# Patient Record
Sex: Male | Born: 1950 | Race: White | Hispanic: No | Marital: Married | Smoking: Never smoker
Health system: Southern US, Community
[De-identification: ages and names within clinical notes are randomized; demographics above are authoritative.]

## PROBLEM LIST (undated history)

## (undated) DIAGNOSIS — T4145XA Adverse effect of unspecified anesthetic, initial encounter: Secondary | ICD-10-CM

## (undated) DIAGNOSIS — G4733 Obstructive sleep apnea (adult) (pediatric): Secondary | ICD-10-CM

## (undated) DIAGNOSIS — Z8719 Personal history of other diseases of the digestive system: Secondary | ICD-10-CM

## (undated) DIAGNOSIS — M1990 Unspecified osteoarthritis, unspecified site: Secondary | ICD-10-CM

## (undated) DIAGNOSIS — C9000 Multiple myeloma not having achieved remission: Secondary | ICD-10-CM

## (undated) DIAGNOSIS — I499 Cardiac arrhythmia, unspecified: Secondary | ICD-10-CM

## (undated) DIAGNOSIS — I809 Phlebitis and thrombophlebitis of unspecified site: Secondary | ICD-10-CM

## (undated) DIAGNOSIS — I4891 Unspecified atrial fibrillation: Secondary | ICD-10-CM

## (undated) DIAGNOSIS — I82409 Acute embolism and thrombosis of unspecified deep veins of unspecified lower extremity: Secondary | ICD-10-CM

## (undated) DIAGNOSIS — I480 Paroxysmal atrial fibrillation: Secondary | ICD-10-CM

## (undated) DIAGNOSIS — R7302 Impaired glucose tolerance (oral): Secondary | ICD-10-CM

## (undated) DIAGNOSIS — J189 Pneumonia, unspecified organism: Secondary | ICD-10-CM

## (undated) DIAGNOSIS — Z9989 Dependence on other enabling machines and devices: Secondary | ICD-10-CM

## (undated) DIAGNOSIS — R7303 Prediabetes: Secondary | ICD-10-CM

## (undated) DIAGNOSIS — Z923 Personal history of irradiation: Secondary | ICD-10-CM

## (undated) DIAGNOSIS — K219 Gastro-esophageal reflux disease without esophagitis: Secondary | ICD-10-CM

## (undated) DIAGNOSIS — I10 Essential (primary) hypertension: Secondary | ICD-10-CM

## (undated) DIAGNOSIS — T8859XA Other complications of anesthesia, initial encounter: Secondary | ICD-10-CM

## (undated) DIAGNOSIS — E669 Obesity, unspecified: Secondary | ICD-10-CM

## (undated) HISTORY — PX: KNEE ARTHROSCOPY: SHX127

## (undated) HISTORY — DX: Obesity, unspecified: E66.9

## (undated) HISTORY — DX: Paroxysmal atrial fibrillation: I48.0

## (undated) HISTORY — PX: VARICOSE VEIN SURGERY: SHX832

## (undated) HISTORY — PX: TONSILLECTOMY: SUR1361

---

## 1986-05-03 HISTORY — PX: TESTICLE SURGERY: SHX794

## 1997-11-26 ENCOUNTER — Ambulatory Visit (HOSPITAL_COMMUNITY): Admission: RE | Admit: 1997-11-26 | Discharge: 1997-11-26 | Payer: Self-pay | Admitting: Neurology

## 1997-12-03 ENCOUNTER — Ambulatory Visit (HOSPITAL_COMMUNITY): Admission: RE | Admit: 1997-12-03 | Discharge: 1997-12-03 | Payer: Self-pay | Admitting: Neurology

## 2004-04-24 ENCOUNTER — Encounter: Admission: RE | Admit: 2004-04-24 | Discharge: 2004-04-24 | Payer: Self-pay | Admitting: Geriatric Medicine

## 2005-02-01 ENCOUNTER — Ambulatory Visit: Admission: RE | Admit: 2005-02-01 | Discharge: 2005-02-01 | Payer: Self-pay | Admitting: Family Medicine

## 2012-05-24 ENCOUNTER — Observation Stay (HOSPITAL_COMMUNITY)
Admission: EM | Admit: 2012-05-24 | Discharge: 2012-05-24 | Disposition: A | Discharge status: Home / Self Care | Payer: PRIVATE HEALTH INSURANCE | Attending: Interventional Cardiology | Admitting: Interventional Cardiology

## 2012-05-24 ENCOUNTER — Emergency Department (HOSPITAL_COMMUNITY): Payer: PRIVATE HEALTH INSURANCE

## 2012-05-24 ENCOUNTER — Encounter (HOSPITAL_COMMUNITY): Payer: Self-pay | Admitting: *Deleted

## 2012-05-24 DIAGNOSIS — R079 Chest pain, unspecified: Principal | ICD-10-CM | POA: Insufficient documentation

## 2012-05-24 DIAGNOSIS — I10 Essential (primary) hypertension: Secondary | ICD-10-CM | POA: Insufficient documentation

## 2012-05-24 HISTORY — DX: Essential (primary) hypertension: I10

## 2012-05-24 LAB — COMPREHENSIVE METABOLIC PANEL
ALT: 40 U/L (ref 0–53)
Albumin: 3.7 g/dL (ref 3.5–5.2)
Alkaline Phosphatase: 78 U/L (ref 39–117)
BUN: 11 mg/dL (ref 6–23)
CO2: 24 mEq/L (ref 19–32)
Calcium: 9.5 mg/dL (ref 8.4–10.5)
Chloride: 101 mEq/L (ref 96–112)
Creatinine, Ser: 0.82 mg/dL (ref 0.50–1.35)
GFR calc Af Amer: >90 mL/min (ref >90)
GFR calc non Af Amer: >90 mL/min (ref >90)
Glucose, Bld: 106 mg/dL — ABNORMAL HIGH (ref 70–99)
Potassium: 3.6 mEq/L (ref 3.5–5.1)
Total Bilirubin: 0.4 mg/dL (ref 0.3–1.2)
Total Protein: 7.2 g/dL (ref 6.0–8.3)

## 2012-05-24 LAB — CBC WITH DIFFERENTIAL/PLATELET
Basophils Relative: 0 % (ref 0–1)
Eosinophils Absolute: 0.1 10*3/uL (ref 0.0–0.7)
HCT: 44.6 % (ref 39.0–52.0)
Hemoglobin: 16.5 g/dL (ref 13.0–17.0)
Lymphs Abs: 1.4 10*3/uL (ref 0.7–4.0)
MCH: 33.5 pg (ref 26.0–34.0)
MCHC: 37 g/dL — ABNORMAL HIGH (ref 30.0–36.0)
MCV: 90.5 fL (ref 78.0–100.0)
Monocytes Absolute: 1.2 10*3/uL — ABNORMAL HIGH (ref 0.1–1.0)
Monocytes Relative: 13 % — ABNORMAL HIGH (ref 3–12)
Platelets: 228 10*3/uL (ref 150–400)
RBC: 4.93 MIL/uL (ref 4.22–5.81)
RDW: 12.7 % (ref 11.5–15.5)

## 2012-05-24 LAB — CBC
Platelets: 244 10*3/uL (ref 150–400)
RBC: 5.12 MIL/uL (ref 4.22–5.81)
RDW: 12.7 % (ref 11.5–15.5)
WBC: 8.2 10*3/uL (ref 4.0–10.5)

## 2012-05-24 LAB — LIPASE, BLOOD: Lipase: 25 U/L (ref 11–59)

## 2012-05-24 LAB — APTT: aPTT: 33 seconds (ref 24–37)

## 2012-05-24 LAB — CREATININE, SERUM
Creatinine, Ser: 0.91 mg/dL (ref 0.50–1.35)
GFR calc Af Amer: >90 mL/min (ref >90)
GFR calc non Af Amer: 90 mL/min — ABNORMAL LOW (ref >90)

## 2012-05-24 LAB — CK TOTAL AND CKMB (NOT AT ARMC)
CK, MB: 4.7 ng/mL — ABNORMAL HIGH (ref 0.3–4.0)
CK, MB: 4.6 ng/mL — ABNORMAL HIGH (ref 0.3–4.0)
Relative Index: 2.6 — ABNORMAL HIGH (ref 0.0–2.5)

## 2012-05-24 LAB — TROPONIN I
Troponin I: <0.3 ng/mL (ref <0.30)
Troponin I: <0.3 ng/mL (ref <0.30)

## 2012-05-24 LAB — PROTIME-INR: INR: 0.98 (ref 0.00–1.49)

## 2012-05-24 MED ORDER — ASPIRIN 81 MG PO CHEW
324.0000 mg | CHEWABLE_TABLET | Freq: Once | ORAL | Status: AC
  Administered 2012-05-24: 324 mg via ORAL
  Filled 2012-05-24: qty 4

## 2012-05-24 MED ORDER — ASPIRIN 81 MG PO CHEW: 324.0000 mg | CHEWABLE_TABLET | ORAL | Status: DC

## 2012-05-24 MED ORDER — HYDROCHLOROTHIAZIDE 12.5 MG PO CAPS: 12.5000 mg | ORAL_CAPSULE | Freq: Every day | ORAL | Status: DC

## 2012-05-24 MED ORDER — ALPRAZOLAM 0.25 MG PO TABS: 0.2500 mg | ORAL_TABLET | Freq: Two times a day (BID) | ORAL | Status: DC | PRN

## 2012-05-24 MED ORDER — MORPHINE SULFATE 4 MG/ML IJ SOLN: 4.0000 mg | INTRAMUSCULAR | Status: DC | PRN

## 2012-05-24 MED ORDER — ONDANSETRON HCL 4 MG/2ML IJ SOLN: 4.0000 mg | Freq: Four times a day (QID) | INTRAMUSCULAR | Status: DC | PRN

## 2012-05-24 MED ORDER — ASPIRIN EC 81 MG PO TBEC: 81.0000 mg | DELAYED_RELEASE_TABLET | Freq: Every day | ORAL | Status: DC

## 2012-05-24 MED ORDER — ACETAMINOPHEN 325 MG PO TABS: 650.0000 mg | ORAL_TABLET | ORAL | Status: DC | PRN

## 2012-05-24 MED ORDER — ASPIRIN 300 MG RE SUPP: 300.0000 mg | RECTAL | Status: DC

## 2012-05-24 MED ORDER — NITROGLYCERIN 0.4 MG SL SUBL: 0.4000 mg | SUBLINGUAL_TABLET | SUBLINGUAL | Status: DC | PRN

## 2012-05-24 MED ORDER — LISINOPRIL 20 MG PO TABS: 30.0000 mg | ORAL_TABLET | Freq: Every day | ORAL | Status: DC

## 2012-05-24 MED ORDER — ASPIRIN 81 MG PO TBEC: 81.0000 mg | DELAYED_RELEASE_TABLET | Freq: Every day | ORAL | Status: DC

## 2012-05-24 MED ORDER — HEPARIN SODIUM (PORCINE) 5000 UNIT/ML IJ SOLN
5000.0000 [IU] | Freq: Three times a day (TID) | INTRAMUSCULAR | Status: DC
  Filled 2012-05-24 (×3): qty 1

## 2012-05-24 MED ORDER — METOPROLOL SUCCINATE ER 100 MG PO TB24: 100.0000 mg | ORAL_TABLET | Freq: Every day | ORAL | Status: DC

## 2012-05-24 NOTE — Discharge Summary (Signed)
Patient ID: Travis [name] MRN: [id number] DOB/AGE: Mar 08, 1951 62 y.o.  Admit date: 05/24/2012 Discharge date: 05/24/2012  Primary Discharge Diagnosis chest pain Secondary Discharge Diagnosis hypertension  Significant Diagnostic Studies: None  Consults: None  Hospital Course: 62 year old man who is seen in the emergency room do to chest discomfort that woke him from sleep and resolved spontaneously after a few hours.  Given that there were some features concerning for unstable angina, he was observed in the hospital.  He ruled out for MI with multiple sets of enzymes.  His pain was significantly improved throughout his hospital stay.  He was walking in the halls without any difficulty.   He wanted to go home even know it was late at night since his enzymes were negative.  We decided we will followup with an outpatient stress test.     Discharge Exam: Blood pressure 160/82, pulse 63, temperature 98.2 F (36.8 C), temperature source Oral, resp. rate 16, height 5\' 10"  (1.778 m), weight 106.595 kg (235 lb), SpO2 97.00%.   [place]/AT RRR, S1-S2 Clear to auscultation bilaterally Obese No edema  Labs:   Lab Results  Component Value Date   WBC 8.2 05/24/2012   HGB 16.9 05/24/2012   HCT 46.7 05/24/2012   MCV 91.2 05/24/2012   PLT 244 05/24/2012    Lab 05/24/12 1409 05/24/12 0825  NA -- 138  K -- 3.6  CL -- 101  CO2 -- 24  BUN -- 11  CREATININE 0.91 --  CALCIUM -- 9.5  PROT -- 7.2  BILITOT -- 0.4  ALKPHOS -- 78  ALT -- 40  AST -- 32  GLUCOSE -- 106*   Lab Results  Component Value Date   CKTOTAL 177 05/24/2012   CKMB 4.7* 05/24/2012   TROPONINI <0.30 05/24/2012    No results found for this basename: CHOL   No results found for this basename: HDL   No results found for this basename: LDLCALC   No results found for this basename: TRIG   No results found for this basename: CHOLHDL   No results found for this basename: LDLDIRECT      Radiology: No cardiopulmonary  findings EKG: Normal   FOLLOW UP PLANS AND APPOINTMENTS    Medication List     As of 05/24/2012  6:09 PM    TAKE these medications         aspirin 81 MG EC tablet   Take 1 tablet (81 mg total) by mouth daily.      FOLCAPS PO   Take 1 tablet by mouth daily.      hydrochlorothiazide 12.5 MG capsule   Commonly known as: MICROZIDE   Take 12.5 mg by mouth daily.      lisinopril 30 MG tablet   Commonly known as: PRINIVIL,ZESTRIL   Take 30 mg by mouth daily.      metoprolol succinate 100 MG 24 hr tablet   Commonly known as: TOPROL-XL   Take 100 mg by mouth daily. Take with or immediately following a meal.           Follow-up Information    Follow up with Corky Crafts., MD. In 2 days. (stress test)    Contact information:   [address] [place] [address] [phone]          BRING ALL MEDICATIONS WITH YOU TO FOLLOW UP APPOINTMENTS  Time spent with patient to include physician time: 20 minutes  Signed: Alanie Syler S. 05/24/2012, 6:09 PM

## 2012-05-24 NOTE — ED Provider Notes (Signed)
History     CSN: [id number]  Arrival date & time 05/24/12  0865   First MD Initiated Contact with Patient 05/24/12 [phone]      Chief Complaint  Patient presents with  . Chest Pain    (Consider location/radiation/quality/duration/timing/severity/associated sxs/prior treatment) HPI The patient presents with chest pain.  He was awakened from sleep approximately 3 hours prior to arrival with sternal/left sided chest pain.  The pain is dull, pressure-like.  He was initially radiation to the left upper extremity, but this has improved.  He has developed mild lightheadedness since onset of symptoms.  No clear alleviating or exacerbating factors, including no exertional or pleuritic aspect. The patient states that he has a history of hypertension, and reflux.  He states that this is not similar to prior episodes of reflux. No recent cardiac evaluation. He does not smoke.  He drinks one or 2 glasses of wine daily.  Past Medical History  Diagnosis Date  . Hypertension     Past Surgical History  Procedure Date  . Tonsillectomy     No family history on file.  History  Substance Use Topics  . Smoking status: Not on file  . Smokeless tobacco: Not on file  . Alcohol Use: Not on file      Review of Systems  Constitutional:       Per HPI, otherwise negative  HENT:       Per HPI, otherwise negative  Eyes: Negative.   Respiratory:       Per HPI, otherwise negative  Cardiovascular:       Per HPI, otherwise negative  Gastrointestinal: Negative for vomiting.  Genitourinary: Negative.   Musculoskeletal:       Per HPI, otherwise negative  Skin: Negative.   Neurological: Negative for syncope.    Allergies  Review of patient's allergies indicates no known allergies.  Home Medications   Current Outpatient Rx  Name  Route  Sig  Dispense  Refill  . FOLCAPS PO   Oral   Take 1 tablet by mouth daily.         [name] HYDROCHLOROTHIAZIDE 12.5 MG PO CAPS   Oral   Take 12.5 mg by mouth  daily.         [name] LISINOPRIL 30 MG PO TABS   Oral   Take 30 mg by mouth daily.         [name] METOPROLOL SUCCINATE ER 100 MG PO TB24   Oral   Take 100 mg by mouth daily. Take with or immediately following a meal.           BP 155/91  Temp 98.6 F (37 C) (Oral)  Resp 17  Ht 5\' 10"  (1.778 m)  Wt 235 lb (106.595 kg)  BMI 33.72 kg/m2  SpO2 98%  Physical Exam  Nursing note and vitals reviewed. Constitutional: He is oriented to person, place, and time. He appears well-developed. No distress.  HENT:  Head: Normocephalic and atraumatic.  Eyes: Conjunctivae normal and EOM are normal.  Cardiovascular: Normal rate and regular rhythm.   Pulmonary/Chest: Effort normal. No stridor. No respiratory distress.  Abdominal: He exhibits no distension.  Musculoskeletal: He exhibits no edema.  Neurological: He is alert and oriented to person, place, and time.  Skin: Skin is warm and dry.  Psychiatric: He has a normal mood and affect.    ED Course  Procedures (including critical care time)  Labs Reviewed  CBC WITH DIFFERENTIAL - Abnormal; Notable for the following:  MCHC 37.0 (*)     Monocytes Relative 13 (*)     Monocytes Absolute 1.2 (*)     All other components within normal limits  COMPREHENSIVE METABOLIC PANEL  LIPASE, BLOOD  TROPONIN I   Dg Chest 2 View  05/24/2012  *RADIOLOGY REPORT*  Clinical Data: Chest pain.  CHEST - 2 VIEW  Comparison: None  Findings: The cardiac silhouette, mediastinal and hilar contours are within normal limits.  The lungs are clear.  No pleural effusion.  The bony thorax is intact.  IMPRESSION: No acute cardiopulmonary findings.   Original Report Authenticated By: Rudie Meyer, M.D.      No diagnosis found.  Cardiac 70 sinus rhythm normal pulse ox 99% room air normal      Date: 05/24/2012  Rate: 68  Rhythm: normal sinus rhythm  QRS Axis: normal  Intervals: normal  ST/T Wave abnormalities: normal  Conduction Disutrbances:none  Narrative  Interpretation:   Old EKG Reviewed: none available Unremarkable ecg   Update: Patient remains in no distress  MDM  This patient with a history of hypertension now presents after an episode of chest pain.  Given the description of pain: Pressure like, sternal, radiating to left arm, as well as the absence of prior evaluation, and the patient's risk factors, he'll be admitted for further evaluation and management, however, the initial troponin and ECG are largely reassuring.  Gerhard Munch, MD 05/24/12 1045

## 2012-05-24 NOTE — Progress Notes (Signed)
Pt given discharge instructions with understanding. IV d/c and tele d/c. Pt has no questions at this time. Prescription given to pt.

## 2012-05-24 NOTE — Progress Notes (Signed)
Utilization review completed.  

## 2012-05-24 NOTE — ED Notes (Signed)
Admitting MD at bedside.

## 2012-05-24 NOTE — ED Notes (Signed)
Patient transported to X-ray 

## 2012-05-24 NOTE — H&P (Signed)
Admit date: 05/24/2012 Referring Physician:Dr. Jeraldine Loots Primary Cardiologist Eldridge Dace Chief complaint/reason for admission:Chest pain  HPI: 62 y/o who woke up with chest pain this morning.  It was in the center of his chest and it was different than the usual GERD sx that he has.  He felt some soreness going to the left shoulder.  The sx have improved.  He now has some mild soreness in his chest.  There were no inciting or alleviating factors.  Pain just improved on its own.    He is physically active when the weather cooperates.  He plays lacrosse sometimes as well.  No exertional chest pain prior to this.    PMH:    Past Medical History  Diagnosis Date  . Hypertension     PSH:    Past Surgical History  Procedure Date  . Tonsillectomy     ALLERGIES:   Review of patient's allergies indicates no known allergies.  Prior to Admit Meds:   (Not in a hospital admission) Family HX:   No family history on file. Social HX:    History   Social History  . Marital Status: Married    Spouse Name: N/A    Number of Children: N/A  . Years of Education: N/A   Occupational History  . Not on file.   Social History Main Topics  . Smoking status: Not on file  . Smokeless tobacco: Not on file  . Alcohol Use: Not on file  . Drug Use: Not on file  . Sexually Active: Not on file   Other Topics Concern  . Not on file   Social History Narrative  . No narrative on file     ROS:  All 11 ROS were addressed and are negative except what is stated in the HPI  PHYSICAL EXAM Filed Vitals:   05/24/12 1030  BP: 138/83  Pulse: 61  Temp:   Resp: 16   General: Well developed, well nourished, in no acute distress Head: Eyes PERRLA, No xanthomas.   Normal cephalic and atramatic  Lungs: Clear bilaterally to auscultation and percussion. Heart:   HRRR S1 S2  Abdomen: Bowel sounds are positive, abdomen soft and non-tender without masses or                  Hernia's noted. Msk:  Back normal,  normal gait. Normal strength and tone for age. Extremities:   No  edema.  DP +1 Neuro: Alert and oriented X 3. Psych: Normal  affect, responds appropriately   Labs:   Lab Results  Component Value Date   WBC 9.2 05/24/2012   HGB 16.5 05/24/2012   HCT 44.6 05/24/2012   MCV 90.5 05/24/2012   PLT 228 05/24/2012    Lab 05/24/12 0825  NA 138  K 3.6  CL 101  CO2 24  BUN 11  CREATININE 0.82  CALCIUM 9.5  PROT 7.2  BILITOT 0.4  ALKPHOS 78  ALT 40  AST 32  GLUCOSE 106*   Lab Results  Component Value Date   TROPONINI <0.30 05/24/2012   No results found for this basename: PTT   No results found for this basename: INR, PROTIME     No results found for this basename: CHOL   No results found for this basename: HDL   No results found for this basename: LDLCALC   No results found for this basename: TRIG   No results found for this basename: CHOLHDL   No results found for this basename: LDLDIRECT  Radiology:  @RISRSLT24 @  EKG:  Normal sinus rhythm, no ST segment changes  ASSESSMENT: Chest pain with some features concerning for unstable angina  PLAN:  Watch on telemetry.  Rule out for MI with enzymes.  No heparin at this time as his enzymes are negative.  If he rules out and stays symptom free, we'll likely plan for outpatient stress test.  If his enzymes become positive, he would need an angiogram.  Hypertension: Continue ACE inhibitor and diuretic at this time.  His pain has resolved so I'll not start IV nitroglycerin at this time.  Corky Crafts., MD  05/24/2012  10:55 AM

## 2012-05-24 NOTE — ED Notes (Signed)
Awoken from sleep at 0400 with SSCP radiating into LUE, also c/o some lightheadedness past 45 min

## 2012-05-25 LAB — HEMOGLOBIN A1C
Hgb A1c MFr Bld: 5.4 % (ref <5.7)
Mean Plasma Glucose: 108 mg/dL (ref <117)

## 2013-06-20 ENCOUNTER — Other Ambulatory Visit: Payer: Self-pay | Admitting: Geriatric Medicine

## 2013-06-20 DIAGNOSIS — R109 Unspecified abdominal pain: Secondary | ICD-10-CM

## 2013-06-21 ENCOUNTER — Ambulatory Visit
Admission: RE | Admit: 2013-06-21 | Discharge: 2013-06-21 | Disposition: A | Discharge status: Home / Self Care | Payer: PRIVATE HEALTH INSURANCE | Source: Ambulatory Visit | Attending: Geriatric Medicine | Admitting: Geriatric Medicine

## 2013-06-21 DIAGNOSIS — R109 Unspecified abdominal pain: Secondary | ICD-10-CM

## 2014-01-04 ENCOUNTER — Encounter: Payer: Self-pay | Admitting: *Deleted

## 2014-03-14 ENCOUNTER — Emergency Department (HOSPITAL_COMMUNITY): Payer: PRIVATE HEALTH INSURANCE

## 2014-03-14 ENCOUNTER — Encounter (HOSPITAL_COMMUNITY): Payer: Self-pay

## 2014-03-14 ENCOUNTER — Emergency Department (HOSPITAL_COMMUNITY)
Admission: EM | Admit: 2014-03-14 | Discharge: 2014-03-14 | Disposition: A | Discharge status: Home / Self Care | Payer: PRIVATE HEALTH INSURANCE | Attending: Emergency Medicine | Admitting: Emergency Medicine

## 2014-03-14 DIAGNOSIS — K625 Hemorrhage of anus and rectum: Secondary | ICD-10-CM | POA: Diagnosis not present

## 2014-03-14 DIAGNOSIS — K644 Residual hemorrhoidal skin tags: Secondary | ICD-10-CM | POA: Diagnosis not present

## 2014-03-14 DIAGNOSIS — Z79899 Other long term (current) drug therapy: Secondary | ICD-10-CM | POA: Insufficient documentation

## 2014-03-14 DIAGNOSIS — Z7982 Long term (current) use of aspirin: Secondary | ICD-10-CM | POA: Diagnosis not present

## 2014-03-14 DIAGNOSIS — I10 Essential (primary) hypertension: Secondary | ICD-10-CM | POA: Diagnosis not present

## 2014-03-14 LAB — COMPREHENSIVE METABOLIC PANEL
ALBUMIN: 3.8 g/dL (ref 3.5–5.2)
ALK PHOS: 95 U/L (ref 39–117)
ALT: 27 U/L (ref 0–53)
AST: 25 U/L (ref 0–37)
Anion gap: 12 (ref 5–15)
BUN: 15 mg/dL (ref 6–23)
CHLORIDE: 100 meq/L (ref 96–112)
CO2: 27 mEq/L (ref 19–32)
Calcium: 9.5 mg/dL (ref 8.4–10.5)
Creatinine, Ser: 0.92 mg/dL (ref 0.50–1.35)
GFR calc Af Amer: >90 mL/min (ref >90)
GFR calc non Af Amer: 89 mL/min — ABNORMAL LOW (ref >90)
Glucose, Bld: 119 mg/dL — ABNORMAL HIGH (ref 70–99)
POTASSIUM: 3.6 meq/L — AB (ref 3.7–5.3)
Sodium: 139 mEq/L (ref 137–147)
Total Protein: 7.6 g/dL (ref 6.0–8.3)

## 2014-03-14 LAB — CBC
HEMATOCRIT: 44.9 % (ref 39.0–52.0)
Hemoglobin: 15.5 g/dL (ref 13.0–17.0)
MCH: 32 pg (ref 26.0–34.0)
MCHC: 34.5 g/dL (ref 30.0–36.0)
MCV: 92.6 fL (ref 78.0–100.0)
PLATELETS: 248 10*3/uL (ref 150–400)
RBC: 4.85 MIL/uL (ref 4.22–5.81)
RDW: 12.4 % (ref 11.5–15.5)
WBC: 9.7 10*3/uL (ref 4.0–10.5)

## 2014-03-14 LAB — TYPE AND SCREEN
ABO/RH(D): O POS
Antibody Screen: NEGATIVE

## 2014-03-14 LAB — POC OCCULT BLOOD, ED: FECAL OCCULT BLD: POSITIVE — AB

## 2014-03-14 LAB — ABO/RH: ABO/RH(D): O POS

## 2014-03-14 NOTE — ED Notes (Signed)
Pt presents with c/o rectal bleeding. Pt reports he went to his doctor yesterday for some irregularities in his bowel movements and did not have any rectal bleeding at that time. Pt reports he noticed some rectal bleeding today however, and was instructed to come here by his doctor for some x-rays. Pt reports he is passing a moderate amount of blood, possible clots noted.

## 2014-03-14 NOTE — ED Provider Notes (Signed)
CSN: [id number]     Arrival date & time 03/14/14  1730 History   First MD Initiated Contact with Patient 03/14/14 1921     Chief Complaint  Patient presents with  . Rectal Bleeding     (Consider location/radiation/quality/duration/timing/severity/associated sxs/prior Treatment) HPI Patient presents with concern of rectal bleeding. This began today.  Multiple times a day, both with defecation, and independently of stool production patient has had bright red blood per rectum.  This is essentially painless, with no abdominal or rectal discomfort. No concurrent n/v/d. Patient has a Hx of irregular bowel movements, and has been taking metamucil and miralax regularly.  He saw his physician yesterday. He was referred here to the ED today for eval / XR.    Past Medical History  Diagnosis Date  . Hypertension   . Chest pain at rest   . Palpitations    Past Surgical History  Procedure Laterality Date  . Tonsillectomy     Family History  Problem Relation Age of Onset  . Family history unknown: Yes   History  Substance Use Topics  . Smoking status: Never Smoker   . Smokeless tobacco: Not on file  . Alcohol Use: Yes     Comment: daily; a couple of glasses of wine every night     Review of Systems  Constitutional:       Per HPI, otherwise negative  HENT:       Per HPI, otherwise negative  Respiratory:       Per HPI, otherwise negative  Cardiovascular:       Per HPI, otherwise negative  Gastrointestinal: Positive for blood in stool. Negative for vomiting.  Endocrine:       Negative aside from HPI  Genitourinary:       Neg aside from HPI   Musculoskeletal:       Per HPI, otherwise negative  Skin: Negative.   Neurological: Negative for syncope.      Allergies  Review of patient's allergies indicates no known allergies.  Home Medications   Prior to Admission medications   Medication Sig Start Date End Date Taking? Authorizing Provider  amLODipine (NORVASC) 10 MG  tablet Take 10 mg by mouth daily.   Yes Historical Provider, MD  aspirin EC 81 MG EC tablet Take 1 tablet (81 mg total) by mouth daily. 05/24/12  Yes Jettie Booze, MD  Docusate Calcium (STOOL SOFTENER PO) Take 1 tablet by mouth daily.   Yes Historical Provider, MD  Folic Acid-Vit [id number]-[id number] X91 (FOLCAPS PO) Take 1 tablet by mouth daily.   Yes Historical Provider, MD  hydrochlorothiazide (MICROZIDE) 12.5 MG capsule Take 12.5 mg by mouth daily.   Yes Historical Provider, MD  ketoconazole (NIZORAL) 200 MG tablet Take 100 mg by mouth daily.   Yes Historical Provider, MD  lisinopril (PRINIVIL,ZESTRIL) 30 MG tablet Take 30 mg by mouth daily.   Yes Historical Provider, MD  metoprolol succinate (TOPROL-XL) 100 MG 24 hr tablet Take 100 mg by mouth daily. Take with or immediately following a meal.   Yes Historical Provider, MD  Probiotic Product (ALIGN) 4 MG CAPS Take 1 capsule by mouth daily.   Yes Historical Provider, MD  tadalafil (CIALIS) 20 MG tablet Take 20 mg by mouth daily as needed for erectile dysfunction.   Yes Historical Provider, MD  terbinafine (LAMISIL) 1 % cream Apply 1 application topically 2 (two) times daily.   Yes Historical Provider, MD   BP 142/86 mmHg  Pulse 60  Temp(Src) 98.5  F (36.9 C) (Oral)  Resp 20  SpO2 97% Physical Exam  Constitutional: He is oriented to person, place, and time. He appears well-developed. No distress.  HENT:  Head: Normocephalic and atraumatic.  Eyes: Conjunctivae and EOM are normal.  Cardiovascular: Normal rate and regular rhythm.   Pulmonary/Chest: Effort normal. No stridor. No respiratory distress.  Abdominal: He exhibits no distension.  Genitourinary: Rectal exam shows external hemorrhoid. Rectal exam shows no internal hemorrhoid, no mass and anal tone normal. Prostate is not tender.  Musculoskeletal: He exhibits no edema.  Neurological: He is alert and oriented to person, place, and time.  Skin: Skin is warm and dry.  Psychiatric: He has a  normal mood and affect.  Nursing note and vitals reviewed.   ED Course  Procedures (including critical care time) Labs Review Labs Reviewed  COMPREHENSIVE METABOLIC PANEL - Abnormal; Notable for the following:    Potassium 3.6 (*)    Glucose, Bld 119 (*)    Total Bilirubin <0.2 (*)    GFR calc non Af Amer 89 (*)    All other components within normal limits  POC OCCULT BLOOD, ED - Abnormal; Notable for the following:    Fecal Occult Bld POSITIVE (*)    All other components within normal limits  CBC  POC OCCULT BLOOD, ED  TYPE AND SCREEN  ABO/RH    Imaging Review Dg Abd 2 Views  03/14/2014   CLINICAL DATA:  Rectal bleeding started today.  EXAM: ABDOMEN - 2 VIEW  COMPARISON:  None.  FINDINGS: A fair amount of stool is seen in the colon. No small bowel dilatation. No free air. No unexpected radiopaque calculi. Visualized lung bases are clear.  IMPRESSION: Bowel gas pattern suggests mild constipation.  No acute findings.   Electronically Signed   By: Lorin Picket M.D.   On: 03/14/2014 19:58     EKG Interpretation None     Patient OSH records reviewed, including colonoscopy results, lab results, ultrasound of the abdomen results.  Hemoccult positive  Update: patient in no distress, aware of all results.  Patient will follow up with gastroenterology tomorrow. MDM   Final diagnoses:  Rectal bleeding    Patient presents with one day of rectal bleeding.  Patient has history of diverticulosis, diagnosed via colonoscopy 1 year ago.  Patient is hemodynamically stable, with no evidence for distress, no abdominal pain. Patient's evaluation here is reassuring.  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    Carmin Muskrat, MD 03/14/14 2135

## 2014-03-14 NOTE — Discharge Instructions (Signed)
As discussed, your evaluation today has been largely reassuring.  But, it is important that you monitor your condition carefully, and do not hesitate to return to the ED if you develop new, or concerning changes in your condition.  Otherwise, please follow-up with your physician for appropriate ongoing care.  Bloody Stools Bloody stools often mean that there is a problem in the digestive tract. Your caregiver may use the term "melena" to describe black, tarry, and bad smelling stools or "hematochezia" to describe red or maroon-colored stools. Blood seen in the stool can be caused by bleeding anywhere along the intestinal tract.  A black stool usually means that blood is coming from the upper part of the gastrointestinal tract (esophagus, stomach, or small bowel). Passing maroon-colored stools or bright red blood usually means that blood is coming from lower down in the large bowel or the rectum. However, sometimes massive bleeding in the stomach or small intestine can cause bright red bloody stools.  Consuming black licorice, lead, iron pills, medicines containing bismuth subsalicylate, or blueberries can also cause black stools. Your caregiver can test black stools to see if blood is present. It is important that the cause of the bleeding be found. Treatment can then be started, and the problem can be corrected. Rectal bleeding may not be serious, but you should not assume everything is okay until you know the cause.It is very important to follow up with your caregiver or a specialist in gastrointestinal problems. CAUSES  Blood in the stools can come from various underlying causes.Often, the cause is not found during your first visit. Testing is often needed to discover the cause of bleeding in the gastrointestinal tract. Causes range from simple to serious or even life-threatening.Possible causes include:  Hemorrhoids.These are veins that are full of blood (engorged) in the rectum. They cause pain,  inflammation, and may bleed.  Anal fissures.These are areas of painful tearing which may bleed. They are often caused by passing hard stool.  Diverticulosis.These are pouches that form on the colon over time, with age, and may bleed significantly.  Diverticulitis.This is inflammation in areas with diverticulosis. It can cause pain, fever, and bloody stools, although bleeding is rare.  Proctitis and colitis. These are inflamed areas of the rectum or colon. They may cause pain, fever, and bloody stools.  Polyps and cancer. Colon cancer is a leading cause of preventable cancer death.It often starts out as precancerous polyps that can be removed during a colonoscopy, preventing progression into cancer. Sometimes, polyps and cancer may cause rectal bleeding.  Gastritis and ulcers.Bleeding from the upper gastrointestinal tract (near the stomach) may travel through the intestines and produce black, sometimes tarry, often bad smelling stools. In certain cases, if the bleeding is fast enough, the stools may not be black, but red and the condition may be life-threatening. SYMPTOMS  You may have stools that are bright red and bloody, that are normal color with blood on them, or that are dark black and tarry. In some cases, you may only have blood in the toilet bowl. Any of these cases need medical care. You may also have:  Pain at the anus or anywhere in the rectum.  Lightheadedness or feeling faint.  Extreme weakness.  Nausea or vomiting.  Fever. DIAGNOSIS Your caregiver may use the following methods to find the cause of your bleeding:  Taking a medical history. Age is important. Older people tend to develop polyps and cancer more often. If there is anal pain and a hard, large  stool associated with bleeding, a tear of the anus may be the cause. If blood drips into the toilet after a bowel movement, bleeding hemorrhoids may be the problem. The color and frequency of the bleeding are additional  considerations. In most cases, the medical history provides clues, but seldom the final answer.  A visual and finger (digital) exam. Your caregiver will inspect the anal area, looking for tears and hemorrhoids. A finger exam can provide information when there is tenderness or a growth inside. In men, the prostate is also examined.  Endoscopy. Several types of small, long scopes (endoscopes) are used to view the colon.  In the office, your caregiver may use a rigid, or more commonly, a flexible viewing sigmoidoscope. This exam is called flexible sigmoidoscopy. It is performed in 5 to 10 minutes.  A more thorough exam is accomplished with a colonoscope. It allows your caregiver to view the entire 5 to 6 foot long colon. Medicine to help you relax (sedative) is usually given for this exam. Frequently, a bleeding lesion may be present beyond the reach of the sigmoidoscope. So, a colonoscopy may be the best exam to start with. Both exams are usually done on an outpatient basis. This means the patient does not stay overnight in the hospital or surgery center.  An upper endoscopy may be needed to examine your stomach. Sedation is used and a flexible endoscope is put in your mouth, down to your stomach.  A barium enema X-ray. This is an X-ray exam. It uses liquid barium inserted by enema into the rectum. This test alone may not identify an actual bleeding point. X-rays highlight abnormal shadows, such as those made by lumps (tumors), diverticuli, or colitis. TREATMENT  Treatment depends on the cause of your bleeding.   For bleeding from the stomach or colon, the caregiver doing your endoscopy or colonoscopy may be able to stop the bleeding as part of the procedure.  Inflammation or infection of the colon can be treated with medicines.  Many rectal problems can be treated with creams, suppositories, or warm baths.  Surgery is sometimes needed.  Blood transfusions are sometimes needed if you have lost  a lot of blood.  For any bleeding problem, let your caregiver know if you take aspirin or other blood thinners regularly. HOME CARE INSTRUCTIONS   Take any medicines exactly as prescribed.  Keep your stools soft by eating a diet high in fiber. Prunes (1 to 3 a day) work well for many people.  Drink enough water and fluids to keep your urine clear or pale yellow.  Take sitz baths if advised. A sitz bath is when you sit in a bathtub with warm water for 10 to 15 minutes to soak, soothe, and cleanse the rectal area.  If enemas or suppositories are advised, be sure you know how to use them. Tell your caregiver if you have problems with this.  Monitor your bowel movements to look for signs of improvement or worsening. SEEK MEDICAL CARE IF:   You do not improve in the time expected.  Your condition worsens after initial improvement.  You develop any new symptoms. SEEK IMMEDIATE MEDICAL CARE IF:   You develop severe or prolonged rectal bleeding.  You vomit blood.  You feel weak or faint.  You have a fever. MAKE SURE YOU:  Understand these instructions.  Will watch your condition.  Will get help right away if you are not doing well or get worse. Document Released: 04/09/2002 Document Revised: 07/12/2011  Document Reviewed: 09/04/2010 [facility] Patient Information 2015 [place]. This information is not intended to replace advice given to you by your health care provider. Make sure you discuss any questions you have with your health care provider.

## 2014-05-23 ENCOUNTER — Ambulatory Visit
Admission: RE | Admit: 2014-05-23 | Discharge: 2014-05-23 | Disposition: A | Discharge status: Home / Self Care | Payer: PRIVATE HEALTH INSURANCE | Source: Ambulatory Visit | Attending: Internal Medicine | Admitting: Internal Medicine

## 2014-05-23 ENCOUNTER — Other Ambulatory Visit: Payer: Self-pay | Admitting: Internal Medicine

## 2014-05-23 DIAGNOSIS — R05 Cough: Secondary | ICD-10-CM

## 2014-05-23 DIAGNOSIS — R053 Chronic cough: Secondary | ICD-10-CM

## 2015-04-02 ENCOUNTER — Other Ambulatory Visit: Payer: Self-pay | Admitting: Gastroenterology

## 2015-04-15 ENCOUNTER — Encounter (HOSPITAL_COMMUNITY)
Admission: RE | Disposition: A | Discharge status: Home / Self Care | Payer: Self-pay | Source: Ambulatory Visit | Attending: Gastroenterology

## 2015-04-15 ENCOUNTER — Encounter (HOSPITAL_COMMUNITY): Payer: Self-pay

## 2015-04-15 ENCOUNTER — Ambulatory Visit (HOSPITAL_COMMUNITY)
Admission: RE | Admit: 2015-04-15 | Discharge: 2015-04-15 | Disposition: A | Discharge status: Home / Self Care | Payer: PRIVATE HEALTH INSURANCE | Source: Ambulatory Visit | Attending: Gastroenterology | Admitting: Gastroenterology

## 2015-04-15 DIAGNOSIS — Z86718 Personal history of other venous thrombosis and embolism: Secondary | ICD-10-CM | POA: Insufficient documentation

## 2015-04-15 DIAGNOSIS — K5730 Diverticulosis of large intestine without perforation or abscess without bleeding: Secondary | ICD-10-CM | POA: Diagnosis not present

## 2015-04-15 DIAGNOSIS — K921 Melena: Secondary | ICD-10-CM | POA: Diagnosis present

## 2015-04-15 DIAGNOSIS — R198 Other specified symptoms and signs involving the digestive system and abdomen: Secondary | ICD-10-CM | POA: Diagnosis not present

## 2015-04-15 DIAGNOSIS — I10 Essential (primary) hypertension: Secondary | ICD-10-CM | POA: Diagnosis not present

## 2015-04-15 HISTORY — PX: FLEXIBLE SIGMOIDOSCOPY: SHX5431

## 2015-04-15 SURGERY — SIGMOIDOSCOPY, FLEXIBLE

## 2015-04-15 MED ORDER — SODIUM CHLORIDE 0.9 % IV SOLN: INTRAVENOUS | Status: DC

## 2015-04-15 NOTE — Discharge Instructions (Signed)
Flexible Sigmoidoscopy, Care After  Refer to this sheet in the next few weeks. These instructions provide you with information on caring for yourself after your procedure. Your health care provider may also give you more specific instructions. Your treatment has been planned according to current medical practices, but problems sometimes occur. Call your health care provider if you have any problems or questions after your procedure.  WHAT TO EXPECT AFTER THE PROCEDURE  After your procedure, it is typical to have the following:   · Abdominal cramps.  · Bloating.  · A small amount of rectal bleeding if you had a biopsy.  HOME CARE INSTRUCTIONS  · Only take over-the-counter or prescription medicines for pain, fever, or discomfort as directed by your health care provider.  · Resume your normal diet and activities as directed by your health care provider.  SEEK MEDICAL CARE IF:  · You have abdominal pain or cramping that lasts longer than 1 hour after the procedure.  · You continue to have small amounts of rectal bleeding after 24 hours.  · You have nausea or vomiting.  · You feel weak or dizzy.  SEEK IMMEDIATE MEDICAL CARE IF:   · You have a fever.  · You pass large blood clots or see a large amount of blood in the toilet after having a bowel movement. This may also occur 10-14 days after the procedure. It is more likely if you had a biopsy.  · You develop abdominal pain that is not relieved with medicine or your abdominal pain gets worse.  · You have nausea or vomiting for more than 24 hours after the procedure.     This information is not intended to replace advice given to you by your health care provider. Make sure you discuss any questions you have with your health care provider.     Document Released: 04/24/2013 Document Reviewed: 04/24/2013  Elsevier Interactive Patient Education ©2016 [employer]

## 2015-04-15 NOTE — H&P (Signed)
  Problem: Small-volume hematochezia and tenesmus. 12/21/2012 normal screening colonoscopy performed. Left colonic diverticulosis  History: The patient is a 64 year old male born 1950/10/26. When the patient developed small-volume hematochezia and tenesmus, I prescribed Anusol with hydrocortisone suppositories. His hematochezia resolved but he continues to experience tenesmus. He is scheduled to undergo an unsedated diagnostic flexible proctosigmoidoscopy  On 12/21/2012 his screening colonoscopy was normal.  Past medical history: Hypertension. Palpitations. History of lower extremity deep venous thrombosis. Tonsillectomy. Varicocele surgery.  Medication allergies: None  Exam: The patient is alert and lying comfortably on the endoscopy stretcher. Abdomen is soft and nontender to palpation. Lungs are clear to auscultation. Cardiac exam reveals regular rhythm.  Plan: Proceed with diagnostic flexible proctosigmoidoscopy.

## 2015-04-15 NOTE — Op Note (Signed)
Problem: Tenesmus. Resolved small-volume hematochezia. 12/21/2012 normal screening colonoscopy performed  Endoscopist: Earle Gell  Premedication: None  Procedure: Unsedated proctocolonoscopy to the distal ascending colon The patient was placed in the left lateral decubitus position. Anal inspection and digital rectal exam were normal. The Pentax pediatric colonoscope was introduced into the rectum and easily advanced to the distal ascending colon. The patient received a tapwater enema prior to the procedure. The colon was satisfactorily prepped to the distal ascending colon. Solid stool filled the cecum and ascending colon.  Rectum. Normal. Retroflexed view of the distal rectum was normal  Sigmoid colon and descending colon. Colonic diverticulosis  Splenic flexure. Normal  Transverse colon. Normal  Hepatic flexure. Normal  Distal ascending colon. Normal  Assessment: Normal proctocolonoscopy to the distal ascending colon  Plan: Sublingual hyoscyamine 0.125 mg dissolved beneath every 8 hours as needed to control tenesmus symptom

## 2015-04-16 ENCOUNTER — Encounter (HOSPITAL_COMMUNITY): Payer: Self-pay | Admitting: Gastroenterology

## 2015-05-14 ENCOUNTER — Other Ambulatory Visit: Payer: Self-pay | Admitting: Geriatric Medicine

## 2015-05-14 ENCOUNTER — Ambulatory Visit
Admission: RE | Admit: 2015-05-14 | Discharge: 2015-05-14 | Disposition: A | Discharge status: Home / Self Care | Payer: PRIVATE HEALTH INSURANCE | Source: Ambulatory Visit | Attending: Geriatric Medicine | Admitting: Geriatric Medicine

## 2015-05-14 DIAGNOSIS — R109 Unspecified abdominal pain: Secondary | ICD-10-CM

## 2015-05-14 MED ORDER — IOPAMIDOL (ISOVUE-300) INJECTION 61%
125.0000 mL | Freq: Once | INTRAVENOUS | Status: AC | PRN
  Administered 2015-05-14: 125 mL via INTRAVENOUS

## 2015-10-03 ENCOUNTER — Encounter (HOSPITAL_COMMUNITY): Payer: Self-pay | Admitting: Emergency Medicine

## 2015-10-03 ENCOUNTER — Emergency Department (HOSPITAL_COMMUNITY): Payer: PRIVATE HEALTH INSURANCE

## 2015-10-03 ENCOUNTER — Emergency Department (HOSPITAL_COMMUNITY)
Admission: EM | Admit: 2015-10-03 | Discharge: 2015-10-03 | Disposition: A | Discharge status: Home / Self Care | Payer: PRIVATE HEALTH INSURANCE | Attending: Emergency Medicine | Admitting: Emergency Medicine

## 2015-10-03 DIAGNOSIS — Z7982 Long term (current) use of aspirin: Secondary | ICD-10-CM | POA: Diagnosis not present

## 2015-10-03 DIAGNOSIS — R55 Syncope and collapse: Secondary | ICD-10-CM | POA: Diagnosis not present

## 2015-10-03 DIAGNOSIS — I10 Essential (primary) hypertension: Secondary | ICD-10-CM | POA: Diagnosis not present

## 2015-10-03 LAB — BASIC METABOLIC PANEL
Anion gap: 5 (ref 5–15)
BUN: 16 mg/dL (ref 6–20)
CHLORIDE: 105 mmol/L (ref 101–111)
CO2: 26 mmol/L (ref 22–32)
CREATININE: 0.87 mg/dL (ref 0.61–1.24)
Calcium: 8.9 mg/dL (ref 8.9–10.3)
GFR calc non Af Amer: >60 mL/min (ref >60)
Glucose, Bld: 120 mg/dL — ABNORMAL HIGH (ref 65–99)
POTASSIUM: 3.7 mmol/L (ref 3.5–5.1)
Sodium: 136 mmol/L (ref 135–145)

## 2015-10-03 LAB — D-DIMER, QUANTITATIVE (NOT AT ARMC): D DIMER QUANT: 0.69 ug{FEU}/mL — AB (ref 0.00–0.50)

## 2015-10-03 LAB — URINALYSIS, ROUTINE W REFLEX MICROSCOPIC
Bilirubin Urine: NEGATIVE
GLUCOSE, UA: NEGATIVE mg/dL
Hgb urine dipstick: NEGATIVE
KETONES UR: NEGATIVE mg/dL
LEUKOCYTES UA: NEGATIVE
Nitrite: NEGATIVE
PROTEIN: NEGATIVE mg/dL
SPECIFIC GRAVITY, URINE: 1.016 (ref 1.005–1.030)
pH: 7.5 (ref 5.0–8.0)

## 2015-10-03 LAB — CBC
HEMATOCRIT: 44.7 % (ref 39.0–52.0)
Hemoglobin: 16.2 g/dL (ref 13.0–17.0)
MCH: 32.9 pg (ref 26.0–34.0)
MCHC: 36.2 g/dL — ABNORMAL HIGH (ref 30.0–36.0)
MCV: 90.7 fL (ref 78.0–100.0)
PLATELETS: 260 10*3/uL (ref 150–400)
RBC: 4.93 MIL/uL (ref 4.22–5.81)
RDW: 13.2 % (ref 11.5–15.5)
WBC: 9.5 10*3/uL (ref 4.0–10.5)

## 2015-10-03 LAB — TROPONIN I

## 2015-10-03 LAB — CBG MONITORING, ED: GLUCOSE-CAPILLARY: 130 mg/dL — AB (ref 65–99)

## 2015-10-03 MED ORDER — IOPAMIDOL (ISOVUE-370) INJECTION 76%
100.0000 mL | Freq: Once | INTRAVENOUS | Status: AC | PRN
  Administered 2015-10-03: 100 mL via INTRAVENOUS

## 2015-10-03 MED ORDER — SODIUM CHLORIDE 0.9 % IV BOLUS (SEPSIS)
500.0000 mL | Freq: Once | INTRAVENOUS | Status: AC
  Administered 2015-10-03: 500 mL via INTRAVENOUS

## 2015-10-03 NOTE — Discharge Instructions (Signed)
If you were given medicines take as directed.  If you are on coumadin or contraceptives realize their levels and effectiveness is altered by many different medicines.  If you have any reaction (rash, tongues swelling, other) to the medicines stop taking and see a physician.   Minimize alcohol and caffeine, stay hydrated with water.  If your blood pressure was elevated in the ER make sure you follow up for management with a primary doctor or return for chest pain, shortness of breath or stroke symptoms.  Please follow up as directed and return to the ER or see a physician for new or worsening symptoms.  Thank you. Filed Vitals:   10/03/15 1105 10/03/15 1107 10/03/15 1343  BP: 156/90 156/90 142/94  Pulse: 64 64 61  Temp: 97.7 F (36.5 C) 97.7 F (36.5 C) 98.2 F (36.8 C)  TempSrc: Oral Oral Oral  Resp: 15  20  SpO2: 96% 96% 90%    Near-Syncope Near-syncope (commonly known as near fainting) is sudden weakness, dizziness, or feeling like you might pass out. During an episode of near-syncope, you may also develop pale skin, have tunnel vision, or feel sick to your stomach (nauseous). Near-syncope may occur when getting up after sitting or while standing for a long time. It is caused by a sudden decrease in blood flow to the brain. This decrease can result from various causes or triggers, most of which are not serious. However, because near-syncope can sometimes be a sign of something serious, a medical evaluation is required. The specific cause is often not determined. HOME CARE INSTRUCTIONS  Monitor your condition for any changes. The following actions may help to alleviate any discomfort you are experiencing:  Have someone stay with you until you feel stable.  Lie down right away and prop your feet up if you start feeling like you might faint. Breathe deeply and steadily. Wait until all the symptoms have passed. Most of these episodes last only a few minutes. You may feel tired for several  hours.   Drink enough fluids to keep your urine clear or pale yellow.   If you are taking blood pressure or heart medicine, get up slowly when seated or lying down. Take several minutes to sit and then stand. This can reduce dizziness.  Follow up with your health care provider as directed. SEEK IMMEDIATE MEDICAL CARE IF:   You have a severe headache.   You have unusual pain in the chest, abdomen, or back.   You are bleeding from the mouth or rectum, or you have black or tarry stool.   You have an irregular or very fast heartbeat.   You have repeated fainting or have seizure-like jerking during an episode.   You faint when sitting or lying down.   You have confusion.   You have difficulty walking.   You have severe weakness.   You have vision problems.  MAKE SURE YOU:   Understand these instructions.  Will watch your condition.  Will get help right away if you are not doing well or get worse.   This information is not intended to replace advice given to you by your health care provider. Make sure you discuss any questions you have with your health care provider.   Document Released: 04/19/2005 Document Revised: 04/24/2013 Document Reviewed: 09/22/2012 Elsevier Interactive Patient Education [employer].

## 2015-10-03 NOTE — ED Provider Notes (Signed)
CSN: [id number]     Arrival date & time 10/03/15  1059 History   First MD Initiated Contact with Patient 10/03/15 1209     Chief Complaint  Patient presents with  . Near Syncope     (Consider location/radiation/quality/duration/timing/severity/associated sxs/prior Treatment) HPI Comments: 65 year old male with history of high blood pressure compliant with multiple medications presents after near syncope. Patient is sitting at his desk at work being lightheaded. No full loss of consciousness. Patient last evening felt his heart rate and blood pressure very high. Patient's had a few mild episodes in the past but this is most significant. No diaphoresis chest pain or shortness of breath. Patient had blood clot in his leg years ago not currently on anticoagulant. No recent surgeries, no cancer history. Patient has been staying hydrated.  Patient is a 65 y.o. male presenting with near-syncope. The history is provided by the patient.  Near Syncope Pertinent negatives include no chest pain, no abdominal pain, no headaches and no shortness of breath.    Past Medical History  Diagnosis Date  . Hypertension   . Chest pain at rest   . Palpitations   . Difficult intubation     Difficult to wake up    Past Surgical History  Procedure Laterality Date  . Tonsillectomy    . Flexible sigmoidoscopy N/A 04/15/2015    Procedure:  UNSEDATED FLEXIBLE SIGMOIDOSCOPY;  Surgeon: [name], MD;  Location: [facility];  Service: Endoscopy;  Laterality: N/A;   Family History  Problem Relation Age of Onset  . Family history unknown: Yes   Social History  Substance Use Topics  . Smoking status: Never Smoker   . Smokeless tobacco: None  . Alcohol Use: Yes     Comment: daily; a couple of glasses of wine every night     Review of Systems  Constitutional: Negative for fever and chills.  HENT: Negative for congestion.   Eyes: Negative for visual disturbance.  Respiratory: Negative for shortness of  breath.   Cardiovascular: Positive for palpitations and near-syncope. Negative for chest pain and leg swelling.  Gastrointestinal: Negative for vomiting and abdominal pain.  Genitourinary: Negative for dysuria and flank pain.  Musculoskeletal: Negative for back pain, neck pain and neck stiffness.  Skin: Negative for rash.  Neurological: Positive for light-headedness. Negative for headaches.      Allergies  Review of patient's allergies indicates no known allergies.  Home Medications   Prior to Admission medications   Medication Sig Start Date End Date Taking? Authorizing Provider  amLODipine (NORVASC) 10 MG tablet Take 10 mg by mouth daily.   Yes Historical Provider, MD  aspirin EC 81 MG EC tablet Take 1 tablet (81 mg total) by mouth daily. 05/24/12  Yes Jettie Booze, MD  Folic Acid-Vit [id number] (FOLCAPS PO) Take 1 tablet by mouth daily.   Yes Historical Provider, MD  hydrochlorothiazide (MICROZIDE) 12.5 MG capsule Take 12.5 mg by mouth daily.   Yes Historical Provider, MD  HYDROcodone-acetaminophen (NORCO/VICODIN) 5-325 MG tablet Take 1 tablet by mouth every 8 (eight) hours as needed. Pain 07/03/15  Yes Historical Provider, MD  lisinopril (PRINIVIL,ZESTRIL) 30 MG tablet Take 30 mg by mouth daily.   Yes Historical Provider, MD  metoprolol succinate (TOPROL-XL) 100 MG 24 hr tablet Take 100 mg by mouth daily. Take with or immediately following a meal.   Yes Historical Provider, MD  Probiotic Product (PROBIOTIC PO) Take 1 capsule by mouth daily.   Yes Historical Provider, MD  psyllium (  METAMUCIL) 58.6 % powder Take 1 packet by mouth daily.   Yes Historical Provider, MD  tadalafil (CIALIS) 20 MG tablet Take 20 mg by mouth daily as needed for erectile dysfunction.   Yes Historical Provider, MD   BP 142/94 mmHg  Pulse 61  Temp(Src) 98.2 F (36.8 C) (Oral)  Resp 20  SpO2 90% Physical Exam  Constitutional: He is oriented to person, place, and time. He appears well-developed and  well-nourished.  HENT:  Head: Normocephalic and atraumatic.  Eyes: Conjunctivae are normal. Right eye exhibits no discharge. Left eye exhibits no discharge.  Neck: Normal range of motion. Neck supple. No tracheal deviation present.  Cardiovascular: Normal rate, regular rhythm and intact distal pulses.   No murmur heard. Pulmonary/Chest: Effort normal and breath sounds normal.  Abdominal: Soft. He exhibits no distension. There is no tenderness. There is no guarding.  Musculoskeletal: He exhibits no edema.  Neurological: He is alert and oriented to person, place, and time.  Skin: Skin is warm. No rash noted.  Psychiatric: He has a normal mood and affect.  Nursing note and vitals reviewed.   ED Course  Procedures (including critical care time) Labs Review Labs Reviewed  BASIC METABOLIC PANEL - Abnormal; Notable for the following:    Glucose, Bld 120 (*)    All other components within normal limits  CBC - Abnormal; Notable for the following:    MCHC 36.2 (*)    All other components within normal limits  D-DIMER, QUANTITATIVE (NOT AT [facility]) - Abnormal; Notable for the following:    D-Dimer, Quant 0.69 (*)    All other components within normal limits  CBG MONITORING, ED - Abnormal; Notable for the following:    Glucose-Capillary 130 (*)    All other components within normal limits  URINALYSIS, ROUTINE W REFLEX MICROSCOPIC (NOT AT [facility])  TROPONIN I    Imaging Review Ct Angio Chest Pe W/cm &/or Wo Cm  10/03/2015  CLINICAL DATA:  Near syncope today EXAM: CT ANGIOGRAPHY CHEST WITH CONTRAST TECHNIQUE: Multidetector CT imaging of the chest was performed using the standard protocol during bolus administration of intravenous contrast. Multiplanar CT image reconstructions and MIPs were obtained to evaluate the vascular anatomy. CONTRAST:  100 mL Isovue 370 COMPARISON:  05/23/2014, 10/31/2006 FINDINGS: Mediastinum/Nodes: Thoracic inlet is normal. No significant hilar or mediastinal adenopathy.  Thoracic aorta demonstrates no dissection or dilatation. No evidence of pericardial effusion. There are no filling defects in the pulmonary arterial system. Lungs/Pleura: There are no pleural effusions. The lungs are clear except for mild subsegmental atelectasis in the left lower lobe. Upper abdomen: 2 cm upper pole left renal cyst. Mild diffuse hepatic steatosis. Musculoskeletal: No acute musculoskeletal findings. Review of the MIP images confirms the above findings. IMPRESSION: No acute findings.  No evidence of pulmonary arterial embolism. Electronically Signed   By: Skipper Cliche M.D.   On: 10/03/2015 14:43   I have personally reviewed and evaluated these images and lab results as part of my medical decision-making.   EKG Interpretation   Date/Time:  Friday October 03 2015 11:19:32 EDT Ventricular Rate:  59 PR Interval:  167 QRS Duration: 86 QT Interval:  422 QTC Calculation: 418 R Axis:   51 Text Interpretation:  Sinus rhythm Borderline low voltage, extremity leads  Baseline wander in lead(s) II III aVL aVF V5 Confirmed by Marquinn Meschke MD,  Nadie Fiumara [phone]) on 10/03/2015 12:18:29 PM      MDM   Final diagnoses:  Near syncope  Palpitations  Patient presents  with intermittent palpitations and lightheadedness. Clinically concern for intermittent arrhythmia such as atrial fibrillation. With blood clot years ago patient otherwise low risk plan for d-dimer and troponin. No chest pain or shortness breath currently. Patient well-appearing during exam.  Well-appearing on recheck no symptoms. Blood work unremarkable. EKG unremarkable. CT scan of the chest negative for pulmonary embolism. Follow-up with cardiology arranged by significant other.  Results and differential diagnosis were discussed with the patient/parent/guardian. Xrays were independently reviewed by myself.  Close follow up outpatient was discussed, comfortable with the plan.   Medications  sodium chloride 0.9 % bolus 500 mL (0 mLs  Intravenous Stopped 10/03/15 1351)  iopamidol (ISOVUE-370) 76 % injection 100 mL (100 mLs Intravenous Contrast Given 10/03/15 1421)    Filed Vitals:   10/03/15 1105 10/03/15 1107 10/03/15 1343  BP: 156/90 156/90 142/94  Pulse: 64 64 61  Temp: 97.7 F (36.5 C) 97.7 F (36.5 C) 98.2 F (36.8 C)  TempSrc: Oral Oral Oral  Resp: 15  20  SpO2: 96% 96% 90%    Final diagnoses:  Near syncope       Elnora Morrison, MD 10/03/15 1453

## 2015-10-03 NOTE — ED Notes (Addendum)
Patient presents for near syncope, reports sitting at desk and became lightheaded, no LOC. Patient also states last evening both his heart rate and BP were elevated. Denies weakness, N/V, diaphoresis, numbness or tingling.

## 2015-10-06 ENCOUNTER — Ambulatory Visit (INDEPENDENT_AMBULATORY_CARE_PROVIDER_SITE_OTHER): Payer: PRIVATE HEALTH INSURANCE

## 2015-10-06 ENCOUNTER — Encounter: Payer: Self-pay | Admitting: Internal Medicine

## 2015-10-06 ENCOUNTER — Ambulatory Visit (INDEPENDENT_AMBULATORY_CARE_PROVIDER_SITE_OTHER): Payer: PRIVATE HEALTH INSURANCE | Admitting: Internal Medicine

## 2015-10-06 VITALS — BP 138/86 | HR 63 | Ht 70.0 in | Wt 258.0 lb

## 2015-10-06 DIAGNOSIS — I4891 Unspecified atrial fibrillation: Secondary | ICD-10-CM | POA: Diagnosis not present

## 2015-10-06 DIAGNOSIS — G4710 Hypersomnia, unspecified: Secondary | ICD-10-CM | POA: Diagnosis not present

## 2015-10-06 DIAGNOSIS — R400 Somnolence: Secondary | ICD-10-CM

## 2015-10-06 NOTE — Patient Instructions (Signed)
Your physician recommends that you continue on your current medications as directed. Please refer to the Current Medication list given to you today.  Your physician has recommended that you have a sleep study. This test records several body functions during sleep, including: brain activity, eye movement, oxygen and carbon dioxide blood levels, heart rate and rhythm, breathing rate and rhythm, the flow of air through your mouth and nose, snoring, body muscle movements, and chest and belly movement.  Your physician has recommended that you wear an event monitor. Event monitors are medical devices that record the heart's electrical activity. Doctors most often [place] these monitors to diagnose arrhythmias. Arrhythmias are problems with the speed or rhythm of the heartbeat. The monitor is a small, portable device. You can wear one while you do your normal daily activities. This is usually used to diagnose what is causing palpitations/syncope (passing out).  Follow up with your physician will depend on test results.

## 2015-10-06 NOTE — Progress Notes (Signed)
Cardiology Office Note   Date:  10/06/2015   ID:  Saurav, Sharer 01/13/1951, MRN [id number]  PCP:  [name], MD  Cardiologist:   [name], MD   Pt referred from ER   Seen on 6.2 with dizziness   History of Present Illness: Travis [name] is a 65 y.o. male with a history of dizziness Also a history of HTN On 6/2 was sitting at desk  Became light headed.  No CP  Night prior had palitations  Felt HR was irregular.  ? Atrial fib  Never documented   Went to ER  EKG with SR    Pt denies CP  BBreathing is OK Says his HR will go up at times  With this BP is often up  Never had spell like Saturday         Outpatient Prescriptions Prior to Visit  Medication Sig Dispense Refill  . amLODipine (NORVASC) 10 MG tablet Take 10 mg by mouth daily.    [name] aspirin EC 81 MG EC tablet Take 1 tablet (81 mg total) by mouth daily.    . Folic Acid-Vit Q000111Q 123456 (FOLCAPS PO) Take 1 tablet by mouth daily.    . hydrochlorothiazide (MICROZIDE) 12.5 MG capsule Take 12.5 mg by mouth daily.    [name] lisinopril (PRINIVIL,ZESTRIL) 30 MG tablet Take 30 mg by mouth daily.    . metoprolol succinate (TOPROL-XL) 100 MG 24 hr tablet Take 100 mg by mouth daily. Take with or immediately following a meal.    . Probiotic Product (PROBIOTIC PO) Take 1 capsule by mouth daily.    . psyllium (METAMUCIL) 58.6 % powder Take 1 packet by mouth daily.    . tadalafil (CIALIS) 20 MG tablet Take 20 mg by mouth daily as needed for erectile dysfunction.    [name] HYDROcodone-acetaminophen (NORCO/VICODIN) 5-325 MG tablet Take 1 tablet by mouth every 8 (eight) hours as needed. Reported on 10/06/2015     No facility-administered medications prior to visit.     Allergies:   Review of patient's allergies indicates no known allergies.   Past Medical History  Diagnosis Date  . Hypertension   . Chest pain at rest   . Palpitations   . Difficult intubation     Difficult to wake up     Past Surgical History    Procedure Laterality Date  . Tonsillectomy    . Flexible sigmoidoscopy N/A 04/15/2015    Procedure:  UNSEDATED FLEXIBLE SIGMOIDOSCOPY;  Surgeon: [name], MD;  Location: [facility];  Service: Endoscopy;  Laterality: N/A;     Social History:  The patient  reports that he has never smoked. He does not have any smokeless tobacco history on file. He reports that he drinks alcohol. He reports that he does not use illicit drugs.   Family History:  The patient's family history includes Dementia in his mother; Stroke in his father.    ROS:  Please see the history of present illness. All other systems are reviewed and  Negative to the above problem except as noted.    PHYSICAL EXAM: VS:  BP 138/86 mmHg  Pulse 63  Ht 5\' 10"  (1.778 m)  Wt 258 lb (117.028 kg)  BMI 37.02 kg/m2  SpO2 98%  GEN: Well nourished, well developed, in no acute distress HEENT: normal Neck: no JVD, carotid bruits, or masses Cardiac: RRR; no murmurs, rubs, or gallops,no edema  Respiratory:  clear to auscultation bilaterally, normal work of breathing GI: soft, nontender,  nondistended, + BS  No hepatomegaly  MS: no deformity Moving all extremities   Skin: warm and dry, no rash Neuro:  Strength and sensation are intact Psych: euthymic mood, full affect   EKG:  EKG is not ordered today.  On 6/3 SB 59 bpm    Lipid Panel No results found for: CHOL, TRIG, HDL, CHOLHDL, VLDL, LDLCALC, LDLDIRECT    Wt Readings from Last 3 Encounters:  10/06/15 258 lb (117.028 kg)  05/24/12 235 lb (106.595 kg)      ASSESSMENT AND PLAN:  1  Palpitations  Never had an arrhythmia documented  Sat was only dizzy spell   I would set pt up to hae event monitor.    ? Atrial fib  2.  HTN  BP is fair   Will need to be followed  3.  ? Sleep apnea  Pt snores a lot  Set up for sleep study    F?U will be based on test results     Signed, [name], MD  10/06/2015 9:31 AM    [address], [place], [place]  [address] Phone: [phone]; Fax: [phone]

## 2015-11-14 ENCOUNTER — Telehealth: Payer: Self-pay | Admitting: Internal Medicine

## 2015-11-14 DIAGNOSIS — I480 Paroxysmal atrial fibrillation: Secondary | ICD-10-CM

## 2015-11-14 NOTE — Telephone Encounter (Signed)
  Notes Recorded by Fay Records, MD on 11/12/2015 at 3:03 PM Left msg on machine Pt not available Event monitor showed SR and atrial fib ? How he is feeling  He should have 1. Echo 2. Sleep study (sleep apnea if he has it can exacerbate afib) Keep on samw meds for now    Tried several times to reach patient on cell phone.  Got voice mail each time. Left detailed message of event monitor results and that someone will be calling to schedule his echocardiogram. Sleep study already scheduled for Aug 14. Advised [name] wants to know how he is feeling.  Advised he can call back and can leave a message as to how he is feeling and if he has any questions leave a message for me to call him back.

## 2015-11-14 NOTE — Telephone Encounter (Signed)
Follow-up      The pt is calling the nurse back.

## 2015-11-14 NOTE — Telephone Encounter (Signed)
Follow Up:; ° ° °Returning your call. °

## 2015-11-28 ENCOUNTER — Other Ambulatory Visit (HOSPITAL_COMMUNITY): Payer: Self-pay

## 2015-11-28 ENCOUNTER — Ambulatory Visit (HOSPITAL_COMMUNITY): Payer: PRIVATE HEALTH INSURANCE | Attending: Internal Medicine

## 2015-11-28 DIAGNOSIS — I480 Paroxysmal atrial fibrillation: Secondary | ICD-10-CM | POA: Diagnosis not present

## 2015-11-28 DIAGNOSIS — I119 Hypertensive heart disease without heart failure: Secondary | ICD-10-CM | POA: Diagnosis not present

## 2015-11-28 DIAGNOSIS — I4891 Unspecified atrial fibrillation: Secondary | ICD-10-CM | POA: Diagnosis present

## 2015-12-03 ENCOUNTER — Telehealth: Payer: Self-pay | Admitting: Internal Medicine

## 2015-12-03 NOTE — Telephone Encounter (Signed)
Left message for patient to call back if needed.   I have already left him a message today with echo results.

## 2015-12-03 NOTE — Telephone Encounter (Signed)
Follow up  Pt is returning call from message Nurse left him earlier today.

## 2015-12-03 NOTE — Telephone Encounter (Signed)
F/u message ° °Pt returning RN call. Please call back to discuss  °

## 2015-12-15 ENCOUNTER — Ambulatory Visit (HOSPITAL_BASED_OUTPATIENT_CLINIC_OR_DEPARTMENT_OTHER): Payer: PRIVATE HEALTH INSURANCE | Attending: Internal Medicine | Admitting: Cardiology

## 2015-12-15 DIAGNOSIS — R0683 Snoring: Secondary | ICD-10-CM | POA: Diagnosis not present

## 2015-12-15 DIAGNOSIS — G4710 Hypersomnia, unspecified: Secondary | ICD-10-CM | POA: Diagnosis not present

## 2015-12-15 DIAGNOSIS — G4719 Other hypersomnia: Secondary | ICD-10-CM | POA: Diagnosis present

## 2015-12-15 DIAGNOSIS — R400 Somnolence: Secondary | ICD-10-CM

## 2015-12-15 DIAGNOSIS — I493 Ventricular premature depolarization: Secondary | ICD-10-CM | POA: Diagnosis not present

## 2015-12-15 DIAGNOSIS — G4736 Sleep related hypoventilation in conditions classified elsewhere: Secondary | ICD-10-CM | POA: Diagnosis not present

## 2015-12-15 DIAGNOSIS — G4733 Obstructive sleep apnea (adult) (pediatric): Secondary | ICD-10-CM | POA: Insufficient documentation

## 2015-12-15 DIAGNOSIS — I4891 Unspecified atrial fibrillation: Secondary | ICD-10-CM

## 2015-12-17 NOTE — Procedures (Signed)
   Patient Name: Travis [name], Travis [name] Date: 12/15/2015 Gender: Male D.O.B: 28-Oct-1950 Age (years): 64 Referring Provider: [name] Height (inches): 75 Interpreting Physician: Fransico Him MD, ABSM Weight (lbs): 250 RPSGT: Carolin Coy BMI: 36 MRN: [id number] Neck Size: 19.00  CLINICAL INFORMATION Sleep Study Type: NPSG Indication for sleep study: Excessive Daytime Sleepiness   SLEEP STUDY TECHNIQUE As per the AASM Manual for the Scoring of Sleep and Associated Events v2.3 (April 2016) with a hypopnea requiring 4% desaturations. The channels recorded and monitored were frontal, central and occipital EEG, electrooculogram (EOG), submentalis EMG (chin), nasal and oral airflow, thoracic and abdominal wall motion, anterior tibialis EMG, snore microphone, electrocardiogram, and pulse oximetry.  MEDICATIONS Patient's medications include: reviewed in the chart. Medications self-administered by patient during sleep study : No sleep medicine administered.  SLEEP ARCHITECTURE The study was initiated at 10:36:55 PM and ended at 5:19:34 AM. Sleep onset time was 21.4 minutes and the sleep efficiency was 77.4%. The total sleep time was 311.5 minutes. Stage REM latency was prolonged at 254.0 minutes. The patient spent 18.78% of the night in stage N1 sleep, 69.82% in stage N2 sleep, 0.00% in stage N3 and 11.40% in REM. Alpha intrusion was absent. Supine sleep was 0.00%.  RESPIRATORY PARAMETERS The overall apnea/hypopnea index (AHI) was 12.5 per hour. There were 6 total apneas, including 5 obstructive, 1 central and 0 mixed apneas. There were 59 hypopneas and 25 RERAs. The AHI during Stage REM sleep was 33.8 per hour. AHI while supine was N/A per hour. The mean oxygen saturation was 91.54%. The minimum SpO2 during sleep was 85.00%.  Total time spent with Oxygen saturations < 88% was 5.8 minutes.  Moderate snoring was noted during this study.  CARDIAC DATA The 2 lead EKG demonstrated  sinus rhythm. The mean heart rate was 56.29 beats per minute. Other EKG findings include: PVCs.  LEG MOVEMENT DATA The total PLMS were 151 with a resulting PLMS index of 29.09. Associated arousal with leg movement index was 1.2 .  IMPRESSIONS - Mild obstructive sleep apnea occurred during this study (AHI = 12.5/h). - No significant central sleep apnea occurred during this study (CAI = 0.2/h). - Mild oxygen desaturation was noted during this study (Min O2 = 85.00%).  Total time spent with Oxygen saturations < 88% was 5.8 minutes.  - The patient snored with Moderate snoring volume. - EKG findings include PVCs. - Moderate periodic limb movements of sleep occurred during the study. No significant associated arousals.  DIAGNOSIS - Obstructive Sleep Apnea (327.23 [G47.33 ICD-10]) - Nocturnal Hypoxemia (327.26 [G47.36 ICD-10])  RECOMMENDATIONS - Therapeutic CPAP titration to determine optimal pressure required to alleviate sleep disordered breathing. - Avoid alcohol, sedatives and other CNS depressants that may worsen sleep apnea and disrupt normal sleep architecture. - Sleep hygiene should be reviewed to assess factors that may improve sleep quality. - Weight management and regular exercise should be initiated or continued if appropriate. [place], American Board of Sleep Medicine  ELECTRONICALLY SIGNED ON:  12/17/2015, 9:11 PM [place] PH: [phone]   FX: [phone] [place]

## 2015-12-19 NOTE — Progress Notes (Signed)
Patient has been informed of sleep study results.  Stated verbal understanding.  He is friends with an oral surgeon that treats OSA with the mouthpiece, and they have been talking about treatment options.   He is asking if you feel like his apnea could be treated that way, if so, he would like to schedule an appointment with that oral surgeon for treatment.  I let him know that I would route these questions back to you and that I would call him back.

## 2015-12-25 NOTE — Progress Notes (Signed)
Turner approved the request about trying an oral device for OSA.  Left message for patient to call me back.

## 2016-01-02 NOTE — Progress Notes (Signed)
Detailed message left on patients voicemail that he could go to his dentist for the oral device. I let him know if he needed anything else from me, he could call and I would help him.

## 2016-01-23 ENCOUNTER — Encounter: Payer: Self-pay | Admitting: Internal Medicine

## 2016-01-23 NOTE — Progress Notes (Addendum)
Cardiology Office Note   Date:  01/26/2016   ID:  Savion, Dunavin [birth year], MRN [id number]  PCP:  [name], MD  Cardiologist:   [name], MD   Pt presents for f/u of HTn   History of Present Illness: Travis [name] is a 65 y.o. male with a history of HTn and dizzinss  I saw him back in June   I set up for an event mnitor and sleep study   Echo done was normal   Event monitor showe SR andatrial fib   Sleep study also sched  Pt denies palpitations  Breathing is OK      Outpatient Medications Prior to Visit  Medication Sig Dispense Refill  . amLODipine (NORVASC) 10 MG tablet Take 10 mg by mouth daily.    [name] aspirin EC 81 MG EC tablet Take 1 tablet (81 mg total) by mouth daily.    . Folic Acid-Vit Q000111Q 123456 (FOLCAPS PO) Take 1 tablet by mouth daily.    . hydrochlorothiazide (MICROZIDE) 12.5 MG capsule Take 12.5 mg by mouth daily.    [name] lisinopril (PRINIVIL,ZESTRIL) 30 MG tablet Take 30 mg by mouth daily.    . metoprolol succinate (TOPROL-XL) 100 MG 24 hr tablet Take 100 mg by mouth daily. Take with or immediately following a meal.    . psyllium (METAMUCIL) 58.6 % powder Take 1 packet by mouth daily.    . tadalafil (CIALIS) 20 MG tablet Take 20 mg by mouth daily as needed for erectile dysfunction.    . Probiotic Product (PROBIOTIC PO) Take 1 capsule by mouth daily.     No facility-administered medications prior to visit.      Allergies:   Review of patient's allergies indicates no known allergies.   Past Medical History:  Diagnosis Date  . Chest pain at rest   . Difficult intubation    Difficult to wake up   . Hypertension   . Palpitations     Past Surgical History:  Procedure Laterality Date  . FLEXIBLE SIGMOIDOSCOPY N/A 04/15/2015   Procedure:  UNSEDATED FLEXIBLE SIGMOIDOSCOPY;  Surgeon: [name], MD;  Location: [facility];  Service: Endoscopy;  Laterality: N/A;  . TONSILLECTOMY       Social History:  The patient  reports that  he has never smoked. He has never used smokeless tobacco. He reports that he drinks alcohol. He reports that he does not use drugs.   Family History:  The patient's family history includes Dementia in his mother; Stroke in his father.    ROS:  Please see the history of present illness. All other systems are reviewed and  Negative to the above problem except as noted.    PHYSICAL EXAM: VS:  BP 124/80   Pulse 63   Ht 5\' 10"  (1.778 m)   Wt 257 lb 6.4 oz (116.8 kg)   BMI 36.93 kg/m   GEN: Well nourished, well developed, in no acute distress HEENT: normal Neck: no JVD, carotid bruits, or masses Cardiac: RRR; no murmurs, rubs, or gallops,no edema  Respiratory:  clear to auscultation bilaterally, normal work of breathing GI: soft, nontender, nondistended, + BS  No hepatomegaly  MS: no deformity Moving all extremities   Skin: warm and dry, no rash Neuro:  Strength and sensation are intact Psych: euthymic mood, full affect   EKG:  EKG isnot  ordered today.   Lipid Panel No results found for: CHOL, TRIG, HDL, CHOLHDL, VLDL, LDLCALC, LDLDIRECT    Wt  Readings from Last 3 Encounters:  01/26/16 257 lb 6.4 oz (116.8 kg)  12/15/15 250 lb (113.4 kg)  10/06/15 258 lb (117 kg)      ASSESSMENT AND PLAN:  1  PAF  Pt currently appears to be in Echo is 1  Soon to be 2  Will need to switch to NOAC and not ASA     2  HTn  Well controlled  3  Morbid obesity  Refer to dietary  4  Sleep study  PT now using mouth device  Thinks it is helping  Will see about retesting with it to conform Had difficult with CPAP      Current medicines are reviewed at length with the patient today.  The patient does not have concerns regarding medicines.    Signed, [name], MD  01/26/2016 9:13 AM    [address], [place], [place]  [address] Phone: [phone]; Fax: [phone]

## 2016-01-26 ENCOUNTER — Ambulatory Visit (INDEPENDENT_AMBULATORY_CARE_PROVIDER_SITE_OTHER): Payer: PRIVATE HEALTH INSURANCE | Admitting: Internal Medicine

## 2016-01-26 ENCOUNTER — Encounter: Payer: Self-pay | Admitting: Internal Medicine

## 2016-01-26 ENCOUNTER — Encounter (INDEPENDENT_AMBULATORY_CARE_PROVIDER_SITE_OTHER): Payer: Self-pay

## 2016-01-26 VITALS — BP 124/80 | HR 63 | Ht 70.0 in | Wt 257.4 lb

## 2016-01-26 DIAGNOSIS — I10 Essential (primary) hypertension: Secondary | ICD-10-CM

## 2016-01-26 DIAGNOSIS — Z713 Dietary counseling and surveillance: Secondary | ICD-10-CM

## 2016-01-26 NOTE — Patient Instructions (Signed)
Your physician recommends that you continue on your current medications as directed. Please refer to the Current Medication list given to you today.  You have been referred to dietary/nutrition.  Your physician wants you to follow-up in: 6 months with [name].  You will receive a reminder letter in the mail two months in advance. If you don't receive a letter, please call our office to schedule the follow-up appointment.

## 2016-01-28 ENCOUNTER — Encounter: Payer: Self-pay | Admitting: Internal Medicine

## 2016-02-09 ENCOUNTER — Encounter (HOSPITAL_COMMUNITY): Payer: Self-pay

## 2016-02-09 ENCOUNTER — Emergency Department (HOSPITAL_COMMUNITY)
Admission: EM | Admit: 2016-02-09 | Discharge: 2016-02-09 | Disposition: A | Discharge status: Home / Self Care | Payer: PRIVATE HEALTH INSURANCE | Attending: Physician Assistant | Admitting: Physician Assistant

## 2016-02-09 ENCOUNTER — Telehealth: Payer: Self-pay | Admitting: Internal Medicine

## 2016-02-09 DIAGNOSIS — I10 Essential (primary) hypertension: Secondary | ICD-10-CM | POA: Diagnosis not present

## 2016-02-09 DIAGNOSIS — G4733 Obstructive sleep apnea (adult) (pediatric): Secondary | ICD-10-CM | POA: Diagnosis not present

## 2016-02-09 DIAGNOSIS — Z79899 Other long term (current) drug therapy: Secondary | ICD-10-CM | POA: Diagnosis not present

## 2016-02-09 DIAGNOSIS — Z7982 Long term (current) use of aspirin: Secondary | ICD-10-CM | POA: Insufficient documentation

## 2016-02-09 DIAGNOSIS — I4891 Unspecified atrial fibrillation: Secondary | ICD-10-CM | POA: Insufficient documentation

## 2016-02-09 LAB — I-STAT TROPONIN, ED: TROPONIN I, POC: 0 ng/mL (ref 0.00–0.08)

## 2016-02-09 LAB — BASIC METABOLIC PANEL
ANION GAP: 8 (ref 5–15)
BUN: 12 mg/dL (ref 6–20)
CO2: 26 mmol/L (ref 22–32)
Calcium: 9.4 mg/dL (ref 8.9–10.3)
Chloride: 104 mmol/L (ref 101–111)
Creatinine, Ser: 0.95 mg/dL (ref 0.61–1.24)
GFR calc Af Amer: >60 mL/min (ref >60)
Glucose, Bld: 103 mg/dL — ABNORMAL HIGH (ref 65–99)
POTASSIUM: 3.8 mmol/L (ref 3.5–5.1)
SODIUM: 138 mmol/L (ref 135–145)

## 2016-02-09 LAB — CBC
HEMATOCRIT: 47.5 % (ref 39.0–52.0)
HEMOGLOBIN: 16.2 g/dL (ref 13.0–17.0)
MCH: 32 pg (ref 26.0–34.0)
MCHC: 34.1 g/dL (ref 30.0–36.0)
MCV: 93.9 fL (ref 78.0–100.0)
Platelets: 239 10*3/uL (ref 150–400)
RBC: 5.06 MIL/uL (ref 4.22–5.81)
RDW: 13.1 % (ref 11.5–15.5)
WBC: 10.3 10*3/uL (ref 4.0–10.5)

## 2016-02-09 NOTE — Telephone Encounter (Signed)
Follow up  ° ° °Patient returning call back to nurse  °

## 2016-02-09 NOTE — Telephone Encounter (Signed)
Spoke with pt on phone  He is still feeling a little light headed  Felt himself go into afib last night   I have instructed him to go to the [facility] ER  WIll notify ER  To contact service when pt arrives

## 2016-02-09 NOTE — ED Notes (Signed)
Patient verbalized understanding of discharge instructions and denies any further needs or questions at this time. VS stable. Patient ambulatory with steady gait, escorted to ED entrance in wheelchair.   

## 2016-02-09 NOTE — Discharge Instructions (Signed)
You were seen by cardiology. Please follow the recommendations and follow-up as planned.

## 2016-02-09 NOTE — ED Triage Notes (Signed)
Patient complains of palpitations that started last pm while watching tv after exercising. This am reports near syncope. No CP, denies any associated symptoms. Hx of atrial fib and states he knows he was in it last pm.

## 2016-02-09 NOTE — Telephone Encounter (Signed)
Patient had a fib most of the night along with sleep apnea symptoms which do not happen much since wearing the dental appliance.    Sitting at table this am he almost blacked out.   Very lightheaded, "i had to fight it hard not to go out"  Still lightheaded some currently.  BP was 142/86, HR was 60s, had not yet taken meds.  No sweating, no nausea.    States this was the reason he had initially been seen by [name].

## 2016-02-09 NOTE — Telephone Encounter (Signed)
Called patient and advised to come in for EKG.   Routed to [name] for recommendations.

## 2016-02-09 NOTE — ED Provider Notes (Signed)
[facility] DEPT Provider Note   CSN: [id number] Arrival date & time: 02/09/16  1059     History   Chief Complaint Chief Complaint  Patient presents with  . Atrial Fibrillation    HPI Travis [name] is a 65 y.o. male.  HPI   Patient is a 65 year old male with history of a fib, paroxysmal presenting today with A. fib last night. Patient exercised on a stationary bike last night. The went into A fib and spent the night into A. fib. This morning he felt himself go out of A. fib. He called his cardiologist, Dr. [name]. The ofice sent him here to the emergency department. Patient does not have any chest pain. Patient states that when walking this morning he felt a little bit of lightheadedness. Otherwise asymptomatic.  Past Medical History:  Diagnosis Date  . Atrial fibrillation ([place])   . Chest pain at rest   . Difficult intubation    Difficult to wake up   . Hypertension   . Palpitations     Patient Active Problem List   Diagnosis Date Noted  . Hypertension   . Chest pain at rest     Past Surgical History:  Procedure Laterality Date  . FLEXIBLE SIGMOIDOSCOPY N/A 04/15/2015   Procedure:  UNSEDATED FLEXIBLE SIGMOIDOSCOPY;  Surgeon: [name], MD;  Location: [facility];  Service: Endoscopy;  Laterality: N/A;  . TONSILLECTOMY         Home Medications    Prior to Admission medications   Medication Sig Start Date End Date Taking? Authorizing Provider  amLODipine (NORVASC) 10 MG tablet Take 10 mg by mouth daily.   Yes Historical Provider, MD  aspirin EC 81 MG EC tablet Take 1 tablet (81 mg total) by mouth daily. 05/24/12  Yes Jettie Booze, MD  Folic Acid-Vit Q000111Q 123456 (FOLCAPS PO) Take 1 tablet by mouth daily.   Yes Historical Provider, MD  hydrochlorothiazide (MICROZIDE) 12.5 MG capsule Take 12.5 mg by mouth daily.   Yes Historical Provider, MD  lisinopril (PRINIVIL,ZESTRIL) 30 MG tablet Take 30 mg by mouth daily.   Yes Historical Provider, MD    metoprolol succinate (TOPROL-XL) 100 MG 24 hr tablet Take 100 mg by mouth daily. Take with or immediately following a meal.   Yes Historical Provider, MD  psyllium (METAMUCIL) 58.6 % powder Take 1 packet by mouth daily.   Yes Historical Provider, MD  tadalafil (CIALIS) 20 MG tablet Take 20 mg by mouth daily as needed for erectile dysfunction.   Yes Historical Provider, MD    Family History Family History  Problem Relation Age of Onset  . Dementia Mother   . Stroke Father   . Atrial fibrillation Father     Social History Social History  Substance Use Topics  . Smoking status: Never Smoker  . Smokeless tobacco: Never Used  . Alcohol use Yes     Comment: daily; a couple of glasses of wine every night      Allergies   Review of patient's allergies indicates no known allergies.   Review of Systems Review of Systems  Constitutional: Negative for fever.  HENT: Negative for congestion.   Respiratory: Negative for cough, choking and chest tightness.   Cardiovascular: Positive for palpitations. Negative for chest pain.  Gastrointestinal: Negative for abdominal pain.  Psychiatric/Behavioral: Negative for agitation.  All other systems reviewed and are negative.    Physical Exam Updated Vital Signs BP 125/74   Pulse 60   Temp 98.3 F (  36.8 C) (Oral)   Resp 15   Ht 5\' 10"  (1.778 m)   Wt 250 lb (113.4 kg)   SpO2 91%   BMI 35.87 kg/m   Physical Exam  Constitutional: He appears well-developed and well-nourished.  HENT:  Head: Normocephalic and atraumatic.  Eyes: Conjunctivae are normal.  Neck: Neck supple.  Cardiovascular: Normal rate and regular rhythm.   No murmur heard. Pulmonary/Chest: Effort normal and breath sounds normal. No respiratory distress.  Abdominal: Soft. There is no tenderness.  Musculoskeletal: He exhibits no edema.  Neurological: He is alert.  Skin: Skin is warm and dry.  Psychiatric: He has a normal mood and affect.  Nursing note and vitals  reviewed.    ED Treatments / Results  Labs (all labs ordered are listed, but only abnormal results are displayed) Labs Reviewed  BASIC METABOLIC PANEL - Abnormal; Notable for the following:       Result Value   Glucose, Bld 103 (*)    All other components within normal limits  CBC  URINALYSIS, ROUTINE W REFLEX MICROSCOPIC (NOT AT [facility])  I-STAT TROPOININ, ED    EKG  EKG Interpretation  Date/Time:  Monday February 09 2016 11:04:55 EDT Ventricular Rate:  62 PR Interval:  166 QRS Duration: 78 QT Interval:  414 QTC Calculation: 420 R Axis:   95 Text Interpretation:  no acute ischemia No significant change since last tracing Confirmed by Gerald Leitz ([id number]) on 02/09/2016 1:31:11 PM       Radiology No results found.  Procedures Procedures (including critical care time)  Medications Ordered in ED Medications - No data to display   Initial Impression / Assessment and Plan / ED Course  I have reviewed the triage vital signs and the nursing notes.  Pertinent labs & imaging results that were available during my care of the patient were reviewed by me and considered in my medical decision making (see chart for details).  Clinical Course    Very pleasant 65 year old male who reports going into A. fib last night. This has since resolved. Patient has a sinus EKG here. No chest pain. Initial troponin negative. Patient has history of hypertension. No diabetes.  He has CHADs VASC score 1. He took an aspirin prior to arrival.  I will page coverage for Dr.Paulal Harrington Challenger since she sent him here although not sure there is much to do at this time.  3:51 PM Cardiology saw patient. They believe that he is safe to go home. They will follow up as an outpatient.  Final Clinical Impressions(s) / ED Diagnoses   Final diagnoses:  Atrial fibrillation, unspecified type [facility])    New Prescriptions New Prescriptions   No medications on file     Ahmari Duerson Julio Alm, MD 02/09/16  1551

## 2016-02-09 NOTE — Telephone Encounter (Signed)
New message      Pt has been having AFIB episodes over night.  Please advise

## 2016-02-09 NOTE — Telephone Encounter (Signed)
Left messages on mobile and work voice mails to call back.

## 2016-02-09 NOTE — Consult Note (Signed)
Cardiology Consult    Patient ID: [name] MRN: [id number], DOB/AGE: 05/09/1950   Admit date: 02/09/2016 Date of Consult: 02/09/2016  Primary Physician: [name], MD Reason for Consult: Chest Pain, Atrial Fibrillation Primary Cardiologist: [name] Requesting Provider: [name]   History of Present Illness    Travis [name] is a 65 y.o. male with past medical history of PAF, HTN, OSA (using snore guard - intolerant to CPAP) who presents to [facility] ED on 02/09/2016 for evaluation of palpitations.   Wore a cardiac event monitor in 10/2015 which showed atrial fibrillation with max HR of 103, otherwise HR was well-controlled in the 60's to 70's. Home medications include Amlodipine 10mg  daily, HCTZ 12.5mg  daily, Lisinopril 30mg  daily, and Toprol-XL 100mg  daily. Does not check BP regularly at home.  Patient reports he notices "episodes of atrial fibrillation" 1-2 times per month with no identifiable precipitating factors. Yesterday following exercise and returning home, he noted his HR was irregular. Does not think it was fast. He was able to go to sleep without difficulty.  This morning, he awoke and was still in atrial fibrillation. Says he felt dizzy and lightheaded with ambulation. Denies any dyspnea or chest discomfort. On the way to the ER, his symptoms resolved which he associated with conversion to NSR.  No recent illnesses or sick exposures. Says he utilized the stationary bike for 1 hour last night, when he usually only does aerobic activity for 20 minutes then lifts weights. Consumes approximately 2 glasses of wine per night, saying this is his usual amount. No increased palpitations with this. Occasionally consumes Bourbon or Vodka and notes increased palpitations with hard liquor.   While in the ED, labs show a WBC of 10.3, Hgb 16.2, and platelets 239. Na+ 138, K+ 3.8, and creatinine 0.95. Initial troponin negative. EKG shows NSR, HR 62, with no acute ST  or T-wave changes.   Past Medical History   Past Medical History:  Diagnosis Date  . Atrial fibrillation ([place])   . Chest pain at rest   . Difficult intubation    Difficult to wake up   . Hypertension   . Palpitations     Past Surgical History:  Procedure Laterality Date  . FLEXIBLE SIGMOIDOSCOPY N/A 04/15/2015   Procedure:  UNSEDATED FLEXIBLE SIGMOIDOSCOPY;  Surgeon: [name], MD;  Location: [facility];  Service: Endoscopy;  Laterality: N/A;  . TONSILLECTOMY       Allergies  No Known Allergies  Inpatient Medications      Family History    Family History  Problem Relation Age of Onset  . Dementia Mother   . Stroke Father   . Atrial fibrillation Father     Social History    Social History   Social History  . Marital status: Married    Spouse name: N/A  . Number of children: N/A  . Years of education: N/A   Occupational History  . Not on file.   Social History Main Topics  . Smoking status: Never Smoker  . Smokeless tobacco: Never Used  . Alcohol use Yes     Comment: daily; a couple of glasses of wine every night   . Drug use: No  . Sexual activity: Not on file   Other Topics Concern  . Not on file   Social History Narrative  . No narrative on file     Review of Systems    General:  No chills, fever, night sweats or weight  changes.  Cardiovascular:  No chest pain, dyspnea on exertion, edema, orthopnea, paroxysmal nocturnal dyspnea. Positive for palpitations. Dermatological: No rash, lesions/masses Respiratory: No cough, dyspnea Urologic: No hematuria, dysuria Abdominal:   No nausea, vomiting, diarrhea, bright red blood per rectum, melena, or hematemesis Neurologic:  No visual changes, wkns, changes in mental status. Positive for lightheadedness. All other systems reviewed and are otherwise negative except as noted above.  Physical Exam    Blood pressure 125/74, pulse 60, temperature 98.3 F (36.8 C), temperature source Oral, resp.  rate 15, height 5\' 10"  (1.778 m), weight 250 lb (113.4 kg), SpO2 91 %.  General: Pleasant, overweight Caucasian male appearing in NAD Psych: Normal affect. Neuro: Alert and oriented X 3. Moves all extremities spontaneously. HEENT: Normal  Neck: Supple without bruits or JVD. Lungs:  Resp regular and unlabored, CTA without wheezing or rales. Heart: RRR no s3, s4, or murmurs. Abdomen: Soft, non-tender, non-distended, BS + x 4.  Extremities: No clubbing, cyanosis or edema. DP/PT/Radials 2+ and equal bilaterally.  Labs    Troponin (Point of Care Test)  Recent Labs  02/09/16 1118  TROPIPOC 0.00   No results for input(s): CKTOTAL, CKMB, TROPONINI in the last 72 hours. Lab Results  Component Value Date   WBC 10.3 02/09/2016   HGB 16.2 02/09/2016   HCT 47.5 02/09/2016   MCV 93.9 02/09/2016   PLT 239 02/09/2016     Recent Labs Lab 02/09/16 1108  NA 138  K 3.8  CL 104  CO2 26  BUN 12  CREATININE 0.95  CALCIUM 9.4  GLUCOSE 103*   No results found for: CHOL, HDL, LDLCALC, TRIG Lab Results  Component Value Date   DDIMER 0.69 (H) 10/03/2015     Radiology Studies    No results found.  EKG & Cardiac Imaging    EKG: NSR, HR 62, with no acute ST or T-wave changes.   Echocardiogram: 11/28/2015 Left ventricle:  The cavity size was normal. Wall thickness was increased in a pattern of mild LVH. Systolic function was normal. The estimated ejection fraction was in the range of 60% to 65%. ------------------------------------------------------------------- Aortic valve:   Structurally normal valve.   Cusp separation was normal.  Doppler:  Transvalvular velocity was within the normal range. There was no stenosis. There was no regurgitation. ------------------------------------------------------------------- Mitral valve:   Structurally normal valve.   Leaflet separation was normal.  Doppler:  Transvalvular velocity was within the normal range. There was no evidence for  stenosis. There was no regurgitation. ------------------------------------------------------------------- Left atrium:  The atrium was normal in size. ------------------------------------------------------------------- Right ventricle:  The cavity size was normal. Wall thickness was normal. Systolic function was normal. ------------------------------------------------------------------- Pulmonic valve:    Structurally normal valve.   Cusp separation was normal.  Doppler:  Transvalvular velocity was within the normal range. There was no regurgitation. ------------------------------------------------------------------- Tricuspid valve:   Structurally normal valve.   Leaflet separation was normal.  Doppler:  Transvalvular velocity was within the normal range. There was no regurgitation. ------------------------------------------------------------------- Right atrium:  The atrium was normal in size. ------------------------------------------------------------------- Pericardium:  There was no pericardial effusion.   Assessment & Plan    1. Paroxysmal Atrial Fibrillation - diagnosed in 10/2015 while wearing event monitor which showed rate-controlled atrial fibrillation. Notices episodes of atrial fibrillation 1-2 times per month with no identifiable precipitating factors. Developed palpitations while exercising yesterday, saying he was "more active than usual". Had associated dizziness and lightheadedness with ambulation this AM but symptoms resolved en route to the ED. -  denies any recent illnesses or sick exposures. Does consume 2 glasses of wine per night.  - electrolytes within normal limits and initial troponin negative. EKG shows NSR, HR 62, with no acute ST or T-wave changes.  - This patients CHA2DS2-VASc Score and unadjusted Ischemic Stroke Rate (% per year) is equal to 0.6 % stroke rate/year from a score of 1 (HTN). Not currently on anticoagulation. Takes ASA 81mg  daily. Aware that at  age 16, his score would increase and anticoagulation would certainly be recommended at that time.  - needs TSH checked. Advised to limit alcohol intake as this is a possible trigger for his symptoms. - currently on Toprol-XL 100mg  daily. Could consider initiation of anti-arrhythmic therapy with Flecainide or pill-in-pocket Flecainide with no known history of CAD. - Discussed with Dr. Radford Pax. Will arrange for follow-up in atrial fibrillation clinic in regards to initiation of anti-arrhythmic therapy.   2. HTN - well-controlled. - continue current medication regimen.   3. OSA - underwent sleep study in 12/2015. Intolerant to CPAP, using snore guard.    Signed, Erma Heritage, PA-C 02/09/2016, 2:45 PM Pager: [phone]

## 2016-02-10 ENCOUNTER — Encounter: Payer: Self-pay | Admitting: Dietician

## 2016-02-10 ENCOUNTER — Encounter: Payer: PRIVATE HEALTH INSURANCE | Attending: Internal Medicine | Admitting: Dietician

## 2016-02-10 ENCOUNTER — Telehealth: Payer: Self-pay | Admitting: *Deleted

## 2016-02-10 DIAGNOSIS — I10 Essential (primary) hypertension: Secondary | ICD-10-CM | POA: Diagnosis not present

## 2016-02-10 DIAGNOSIS — E669 Obesity, unspecified: Secondary | ICD-10-CM | POA: Insufficient documentation

## 2016-02-10 DIAGNOSIS — Z713 Dietary counseling and surveillance: Secondary | ICD-10-CM | POA: Insufficient documentation

## 2016-02-10 NOTE — Patient Instructions (Addendum)
  Consider the 80/20 rule  -Continue to cook at home more than eating out  -Reduce red meat and increase lean meats like poultry and fish  -Continue to eat plenty of fruits, vegetables, whole grains, and beans  -Reduce alcohol consumption   -Continue to use salt-free seasonings, herbs, and spices to flavor foods  -Pay attention to portion sizes   -Increase exercise when you can!

## 2016-02-10 NOTE — Progress Notes (Signed)
  Medical Nutrition Therapy:  Appt start time: 210 end time:  320   Assessment:  Primary concerns today: Mr. Mamone states that he is here to discuss his weight. He reports that he does not eat much but he drinks alcohol most nights. He works in Nurse, learning disability. Has been struggling with A-Fib and spent yesterday in the ED for an episode. Notices that liquor triggers Afib more than wine. Otherwise he has not seen any patterns. He lives with his wife and she does the grocery shopping and cooking. Mr. Rao reports that he used to play racquetball regularly and has since hurt his knee; had surgery in the spring. He sometimes works long hours, especially lately due to increased work load. Usual hours are 9:30 or 10am until 6 or 8 pm. Has tried to lose weight in the past and has had "moderate success" but always regains the weight.     Preferred Learning Style:   No preference indicated   Learning Readiness:   Ready  MEDICATIONS: see list   DIETARY INTAKE:  Usual eating pattern includes 3 meals and 1 snacks per day.  24-hr recall:  B ( AM): "Moderate" bowl of "healthy" cereal with milk, banana, orange juice  Snk ( AM): coffee with artificial sweetener and creamer  L ( PM): leftovers at desk Snk ( PM): nuts D ( PM): steak, chicken, or fish with vegetables and sweet potato Snk ( PM): Chai tea  Beverages: water, coffee in the morning, wine or beer in the evening, Chai tea latte (kcups), occasional diet soda  Usual physical activity: stationary bike and weights 2x a week, golf 1-2x a week  Estimated energy needs: 1800-2000 calories  Progress Towards Goal(s):  In progress.   Nutritional Diagnosis:  [place]-2.2 Altered nutrition-related laboratory As related to family history of HTN and overweight.  As evidenced by high blood pressure (managed with medications).    Intervention:  Nutrition counseling provided. Goals: Consider the 80/20 rule -Continue to cook at home more than eating  out -Reduce red meat and increase lean meats like poultry and fish -Continue to eat plenty of fruits, vegetables, whole grains, and beans -Reduce alcohol consumption  -Continue to use salt-free seasonings, herbs, and spices to flavor foods -Pay attention to portion sizes  -Increase exercise when you can!  Teaching Method Utilized:  Visual Auditory Hands on  Handouts given during visit include:  MyPlate  DASH diet  Low sodium flavoring tips  Barriers to learning/adherence to lifestyle change: none  Demonstrated degree of understanding via:  Teach Back   Monitoring/Evaluation:  Dietary intake, exercise, and body weight prn.

## 2016-02-10 NOTE — Telephone Encounter (Signed)
Per a visit in the ED, Dr. Radford Pax would like to do a home sleep study on patient USING his mouthguard to make sure that this is treating his OSA.  I have left a message for patient to call me back to discuss.   Once I have spoken with him, I will send his information to the home sleep company for testing.

## 2016-02-13 ENCOUNTER — Ambulatory Visit (HOSPITAL_COMMUNITY)
Admission: RE | Admit: 2016-02-13 | Discharge: 2016-02-13 | Disposition: A | Discharge status: Home / Self Care | Payer: PRIVATE HEALTH INSURANCE | Source: Ambulatory Visit | Attending: Nurse Practitioner | Admitting: Nurse Practitioner

## 2016-02-13 ENCOUNTER — Encounter (HOSPITAL_COMMUNITY): Payer: Self-pay | Admitting: Nurse Practitioner

## 2016-02-13 VITALS — BP 122/78 | HR 60 | Ht 70.0 in | Wt 255.0 lb

## 2016-02-13 DIAGNOSIS — G4733 Obstructive sleep apnea (adult) (pediatric): Secondary | ICD-10-CM | POA: Diagnosis not present

## 2016-02-13 DIAGNOSIS — I4819 Other persistent atrial fibrillation: Secondary | ICD-10-CM

## 2016-02-13 DIAGNOSIS — Z79899 Other long term (current) drug therapy: Secondary | ICD-10-CM | POA: Insufficient documentation

## 2016-02-13 DIAGNOSIS — Z6836 Body mass index (BMI) 36.0-36.9, adult: Secondary | ICD-10-CM | POA: Diagnosis not present

## 2016-02-13 DIAGNOSIS — Z7982 Long term (current) use of aspirin: Secondary | ICD-10-CM | POA: Insufficient documentation

## 2016-02-13 DIAGNOSIS — R079 Chest pain, unspecified: Secondary | ICD-10-CM | POA: Diagnosis not present

## 2016-02-13 DIAGNOSIS — I480 Paroxysmal atrial fibrillation: Secondary | ICD-10-CM

## 2016-02-13 DIAGNOSIS — I10 Essential (primary) hypertension: Secondary | ICD-10-CM | POA: Insufficient documentation

## 2016-02-13 DIAGNOSIS — I481 Persistent atrial fibrillation: Secondary | ICD-10-CM | POA: Diagnosis not present

## 2016-02-13 DIAGNOSIS — R072 Precordial pain: Secondary | ICD-10-CM

## 2016-02-13 HISTORY — DX: Impaired glucose tolerance (oral): R73.02

## 2016-02-13 HISTORY — DX: Paroxysmal atrial fibrillation: I48.0

## 2016-02-13 HISTORY — DX: Phlebitis and thrombophlebitis of unspecified site: I80.9

## 2016-02-13 MED ORDER — FLECAINIDE ACETATE 50 MG PO TABS: 50.0000 mg | ORAL_TABLET | Freq: Two times a day (BID) | ORAL | 3 refills | Status: DC

## 2016-02-13 NOTE — Progress Notes (Signed)
Primary Care Physician: [name], MD Primary Cardiologist: Dr Harrington Challenger Primary Electrophysiologist: Montavious Wierzba Referring Physician: Dr Particia Nearing Travis [name] is a 65 y.o. male with a history of paroxysmal atrial fibrillation who presents for consultation in the [facility].  The patient was initially diagnosed with atrial fibrillation 6/17 after presenting with symptoms of tachypalpitations and presyncope.  In retrospect, he has had similar symptoms for 10 years.  He has occasional chest discomfort with episodes. Episodes typically occur 1-2 times per month lasting about 8 hours.  He has short bursts of tachycardia throughout the day. This past Monday, he developed sustained afib.  He called Dr Alan Ripper office and was directed to the ED.  He was evaluated by Dr Radford Pax.  By the time he arrived to the ED he was back in sinus rhythm.  Symptoms had resolved.  He was discharged and instructed to follow-up in the AF clinic.  Today, he denies symptoms of chest pain, shortness of breath, orthopnea, PND, lower extremity edema, dizziness, presyncope, syncope, snoring, daytime somnolence, bleeding, or neurologic sequela. The patient is tolerating medications without difficulties and is otherwise without complaint today.    Atrial Fibrillation Risk Factors:  he does have symptoms or diagnosis of sleep apnea. he is not compliant with CPAP therapy but uses nasal strips  he does not have a history of rheumatic fever.  he does have a history of alcohol use.  he has a BMI of Body mass index is 36.59 kg/m.[name] Filed Weights   02/13/16 1030  Weight: 255 lb (115.7 kg)    LA size: 33mm   Atrial Fibrillation Management history:  Previous antiarrhythmic drugs: none  Previous cardioversions: none  Previous ablations: none  CHADS2VASC score: 1-2 (not formally diabetic)  Anticoagulation history: ASA 81mg  daily   Past Medical History:  Diagnosis Date  .  Anesthesia    Difficult to wake up   . Atypical chest pain   . Hypertension   . Impaired glucose tolerance   . Obese   . Obstructive sleep apnea    noncompliant with CPAP, followed by Dr Radford Pax  . Paroxysmal atrial fibrillation [facility])    Past Surgical History:  Procedure Laterality Date  . FLEXIBLE SIGMOIDOSCOPY N/A 04/15/2015   Procedure:  UNSEDATED FLEXIBLE SIGMOIDOSCOPY;  Surgeon: [name], MD;  Location: [facility];  Service: Endoscopy;  Laterality: N/A;  . TESTICLE SURGERY    . TONSILLECTOMY      Current Outpatient Prescriptions  Medication Sig Dispense Refill  . amLODipine (NORVASC) 10 MG tablet Take 10 mg by mouth daily.    [name] aspirin 325 MG tablet Take 325 mg by mouth as needed.    [name] aspirin EC 81 MG EC tablet Take 1 tablet (81 mg total) by mouth daily.    . Folic Acid-Vit [id number] [id number] (FOLCAPS PO) Take 1 tablet by mouth daily.    . hydrochlorothiazide (MICROZIDE) 12.5 MG capsule Take 12.5 mg by mouth daily.    [name] lisinopril (PRINIVIL,ZESTRIL) 30 MG tablet Take 30 mg by mouth daily.    . metoprolol succinate (TOPROL-XL) 100 MG 24 hr tablet Take 100 mg by mouth daily. Take with or immediately following a meal.    . psyllium (METAMUCIL) 58.6 % powder Take 1 packet by mouth daily.    . tadalafil (CIALIS) 20 MG tablet Take 20 mg by mouth daily as needed for erectile dysfunction.     No current facility-administered medications for this encounter.  No Known Allergies  Social History   Social History  . Marital status: Married    Spouse name: N/A  . Number of children: N/A  . Years of education: N/A   Occupational History  . Not on file.   Social History Main Topics  . Smoking status: Never Smoker  . Smokeless tobacco: Never Used  . Alcohol use Yes     Comment: daily; a couple of glasses of wine every night   . Drug use: No  . Sexual activity: Not on file   Other Topics Concern  . Not on file   Social History Narrative   Lives in [place] with  spouse.  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   1 daughter    Family History  Problem Relation Age of Onset  . Dementia Mother   . Stroke Father   . Atrial fibrillation Father   . Hypertension Father   . Hypertension Paternal Grandfather   . Dementia Maternal Grandmother    ROS- All systems are reviewed and negative except as per the HPI above.  Physical Exam: Vitals:   02/13/16 1030  BP: 122/78  Pulse: 60  Weight: 255 lb (115.7 kg)  Height: 5\' 10"  (1.778 m)    GEN- The patient is overweight appearing, alert and oriented x 3 today.   Head- normocephalic, atraumatic Eyes-  Sclera clear, conjunctiva pink Ears- hearing intact Oropharynx- clear Neck- supple  Lungs- Clear to ausculation bilaterally, normal work of breathing Heart- Regular rate and rhythm, no murmurs, rubs or gallops  GI- soft, NT, ND, + BS Extremities- no clubbing, cyanosis, or edema MS- no significant deformity or atrophy Skin- no rash or lesion Psych- euthymic mood, full affect Neuro- strength and sensation are intact  Wt Readings from Last 3 Encounters:  02/13/16 255 lb (115.7 kg)  02/09/16 250 lb (113.4 kg)  01/26/16 257 lb 6.4 oz (116.8 kg)    EKG today demonstrates sinus rhythym 60 bpm, otherwise normal ekg Echo 7/17 demonstrated normal EF, no MR, normal LA size, mild LVH  Epic records are reviewed at length today  Assessment and Plan:  1. Paroxysmal atrial fibrillation The patient has Symptomatic paroxysmal atrial fibrillation.  The patients CHAD2VASC score is 1-2.  He is not on anticoagulation. The patients left atrial size is 40 mm.  Additional echo findings include normal LV function, mild LVH. A long discussion with the patient was had today regarding therapeutic strategies.  Extensive discussion of lifestyle modification including ETOH avoidance and weight reduction was also discussed.  Presently, our recommendations include initiation of flecainide 50mg  BID.  He will return in 2 weeks.   If AF has not improved, would increase flecainide to 100mg  BID. chads2vasc score is 1-3 (he will be 65 in 2 months,  He may be headed towards diabets. We discussed anticoagulation at length today.  Given risks, I have advised anticoagulation.  We discussed AVERROES data.  He may be willing to initiate eliquis upon return but did not want to make multiple changes today.   2. Morbid obesity As above, lifestyle modification was discussed at length including regular exercise and weight reduction.  3. Obstructive sleep apnea The importance of adequate treatment of sleep apnea was discussed today in order to improve our ability to maintain sinus rhythm long term.  He follows with Dr Radford Pax.  4. Chest pain Both typical and atypical features Exercise myoview is ordered today to exclude CAD  Return to see Butch Penny in the AF clinic in 2 weeks.  Thompson Grayer, MD 02/13/2016 10:51 AM

## 2016-02-13 NOTE — Patient Instructions (Signed)
Your physician has recommended you make the following change in your medication:  1)Start flecainide 50mg  twice a day  Hold flecainide and metoprolol 48 hours prior to stress test. Resume once completed.

## 2016-02-13 NOTE — Addendum Note (Signed)
Encounter addended by: Thompson Grayer, MD on: 02/13/2016  1:24 PM<BR>    Actions taken: Charge Capture section accepted

## 2016-02-16 ENCOUNTER — Telehealth (HOSPITAL_COMMUNITY): Payer: Self-pay | Admitting: Radiology

## 2016-02-16 NOTE — Telephone Encounter (Signed)
Patient given detailed instructions per Myocardial Perfusion Study Information Sheet for the test on 02/17/2016 at 7:45. Patient notified to arrive 15 minutes early and that it is imperative to arrive on time for appointment to keep from having the test rescheduled.  If you need to cancel or reschedule your appointment, please call the office within 24 hours of your appointment. Failure to do so may result in a cancellation of your appointment, and a $50 no show fee. Patient verbalized understanding.EHK

## 2016-02-17 ENCOUNTER — Ambulatory Visit (HOSPITAL_COMMUNITY): Payer: PRIVATE HEALTH INSURANCE | Attending: Cardiology

## 2016-02-17 DIAGNOSIS — I4891 Unspecified atrial fibrillation: Secondary | ICD-10-CM | POA: Insufficient documentation

## 2016-02-17 DIAGNOSIS — I4819 Other persistent atrial fibrillation: Secondary | ICD-10-CM

## 2016-02-17 DIAGNOSIS — I481 Persistent atrial fibrillation: Secondary | ICD-10-CM | POA: Diagnosis not present

## 2016-02-17 MED ORDER — TECHNETIUM TC 99M TETROFOSMIN IV KIT
10.3000 | PACK | Freq: Once | INTRAVENOUS | Status: AC | PRN
  Administered 2016-02-17: 10.3 via INTRAVENOUS
  Filled 2016-02-17: qty 11

## 2016-02-18 ENCOUNTER — Ambulatory Visit (HOSPITAL_COMMUNITY): Payer: PRIVATE HEALTH INSURANCE | Attending: Cardiology

## 2016-02-18 DIAGNOSIS — I481 Persistent atrial fibrillation: Secondary | ICD-10-CM | POA: Diagnosis not present

## 2016-02-18 LAB — MYOCARDIAL PERFUSION IMAGING
CHL CUP NUCLEAR SDS: 2
CHL CUP NUCLEAR SRS: 0
CHL CUP RESTING HR STRESS: 78 {beats}/min
CSEPPHR: 88 {beats}/min
LHR: 0.32
LV dias vol: 131 mL (ref 62–150)
LV sys vol: 53 mL
NUC STRESS TID: 0.87
SSS: 2

## 2016-02-18 MED ORDER — REGADENOSON 0.4 MG/5ML IV SOLN
0.4000 mg | Freq: Once | INTRAVENOUS | Status: AC
  Administered 2016-02-18: 0.4 mg via INTRAVENOUS

## 2016-02-18 MED ORDER — TECHNETIUM TC 99M TETROFOSMIN IV KIT
32.3000 | PACK | Freq: Once | INTRAVENOUS | Status: AC | PRN
  Administered 2016-02-18: 32.3 via INTRAVENOUS
  Filled 2016-02-18: qty 33

## 2016-02-25 ENCOUNTER — Ambulatory Visit (HOSPITAL_COMMUNITY): Payer: PRIVATE HEALTH INSURANCE | Admitting: Nurse Practitioner

## 2016-02-27 ENCOUNTER — Encounter (HOSPITAL_COMMUNITY): Payer: Self-pay | Admitting: Nurse Practitioner

## 2016-02-27 ENCOUNTER — Ambulatory Visit (HOSPITAL_COMMUNITY)
Admission: RE | Admit: 2016-02-27 | Discharge: 2016-02-27 | Disposition: A | Discharge status: Home / Self Care | Payer: PRIVATE HEALTH INSURANCE | Source: Ambulatory Visit | Attending: Nurse Practitioner | Admitting: Nurse Practitioner

## 2016-02-27 VITALS — BP 140/82 | HR 63 | Ht 70.0 in

## 2016-02-27 DIAGNOSIS — I480 Paroxysmal atrial fibrillation: Secondary | ICD-10-CM | POA: Diagnosis present

## 2016-02-27 DIAGNOSIS — E669 Obesity, unspecified: Secondary | ICD-10-CM | POA: Insufficient documentation

## 2016-02-27 DIAGNOSIS — Z79899 Other long term (current) drug therapy: Secondary | ICD-10-CM | POA: Diagnosis not present

## 2016-02-27 DIAGNOSIS — Z7901 Long term (current) use of anticoagulants: Secondary | ICD-10-CM | POA: Insufficient documentation

## 2016-02-27 DIAGNOSIS — I4819 Other persistent atrial fibrillation: Secondary | ICD-10-CM

## 2016-02-27 DIAGNOSIS — I481 Persistent atrial fibrillation: Secondary | ICD-10-CM

## 2016-02-27 DIAGNOSIS — I10 Essential (primary) hypertension: Secondary | ICD-10-CM | POA: Insufficient documentation

## 2016-02-27 DIAGNOSIS — G4733 Obstructive sleep apnea (adult) (pediatric): Secondary | ICD-10-CM | POA: Diagnosis not present

## 2016-02-27 MED ORDER — APIXABAN 5 MG PO TABS: 5.0000 mg | ORAL_TABLET | Freq: Two times a day (BID) | ORAL | 6 refills | Status: DC

## 2016-02-27 MED ORDER — APIXABAN 5 MG PO TABS
5.0000 mg | ORAL_TABLET | Freq: Two times a day (BID) | ORAL | 0 refills | Status: DC
Start: 2016-02-27 — End: 2016-02-27

## 2016-02-27 NOTE — Patient Instructions (Signed)
Your physician has recommended you make the following change in your medication:  1)Stop aspirin 2)Start Eliquis 5mg  twice a day

## 2016-02-27 NOTE — Progress Notes (Signed)
Primary Care Physician: [name], MD Referring Physician: Dr. [name] is a 65 y.o. male with a h/o paroxysmal afib diagnosed in June of this year. He was seen last by Dr. Rayann Heman and started on flecainide 50 mg bid. He had a stress test that was low risk on 10/17. He has had very little afib since starting flecainide and feels just a few irregular beats rarely. Dr. Rayann Heman did want to be on blood thinner with age 62 in December, HTN and history of impaired blood glucose. He is ready to start one today. He is asking if hgb A!C could be checked since it has not evaluated in some time. He does drink a glass of wine a night but does not plan to decrease this to 2x weekly. He is planning to travel to [place] soon for 3 weeks.  Today, he denies symptoms of palpitations, chest pain, shortness of breath, orthopnea, PND, lower extremity edema, dizziness, presyncope, syncope, or neurologic sequela. The patient is tolerating medications without difficulties and is otherwise without complaint today.   Past Medical History:  Diagnosis Date  . Anesthesia    Difficult to wake up   . Atypical chest pain   . Hypertension   . Impaired glucose tolerance   . Obese   . Obstructive sleep apnea    noncompliant with CPAP, followed by Dr Radford Pax  . Paroxysmal atrial fibrillation (HCC)   . Thrombophlebitis    Past Surgical History:  Procedure Laterality Date  . FLEXIBLE SIGMOIDOSCOPY N/A 04/15/2015   Procedure:  UNSEDATED FLEXIBLE SIGMOIDOSCOPY;  Surgeon: [name], MD;  Location: [facility];  Service: Endoscopy;  Laterality: N/A;  . TESTICLE SURGERY    . TONSILLECTOMY      Current Outpatient Prescriptions  Medication Sig Dispense Refill  . amLODipine (NORVASC) 10 MG tablet Take 10 mg by mouth daily.    . flecainide (TAMBOCOR) 50 MG tablet Take 1 tablet (50 mg total) by mouth 2 (two) times daily. 60 tablet 3  . Folic Acid-Vit Q000111Q 123456 (FOLCAPS PO) Take 1 tablet by  mouth daily.    . hydrochlorothiazide (MICROZIDE) 12.5 MG capsule Take 12.5 mg by mouth daily.    [name] lisinopril (PRINIVIL,ZESTRIL) 30 MG tablet Take 30 mg by mouth daily.    . metoprolol succinate (TOPROL-XL) 100 MG 24 hr tablet Take 100 mg by mouth daily. Take with or immediately following a meal.    . psyllium (METAMUCIL) 58.6 % powder Take 1 packet by mouth daily.    . tadalafil (CIALIS) 20 MG tablet Take 20 mg by mouth daily as needed for erectile dysfunction.    [name] apixaban (ELIQUIS) 5 MG TABS tablet Take 1 tablet (5 mg total) by mouth 2 (two) times daily. 60 tablet 6   No current facility-administered medications for this encounter.     No Known Allergies  Social History   Social History  . Marital status: Married    Spouse name: N/A  . Number of children: N/A  . Years of education: N/A   Occupational History  . Not on file.   Social History Main Topics  . Smoking status: Never Smoker  . Smokeless tobacco: Never Used  . Alcohol use Yes     Comment: daily; a couple of glasses of wine every night   . Drug use: No  . Sexual activity: Not on file   Other Topics Concern  . Not on file   Social History Narrative  Lives in [place] with spouse.  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   1 daughter    Family History  Problem Relation Age of Onset  . Dementia Mother   . Stroke Father   . Atrial fibrillation Father   . Hypertension Father   . Hypertension Paternal Grandfather   . Dementia Maternal Grandmother     ROS- All systems are reviewed and negative except as per the HPI above  Physical Exam: Vitals:   02/27/16 0913  BP: 140/82  Pulse: 63  Height: 5\' 10"  (1.778 m)    GEN- The patient is well appearing, alert and oriented x 3 today.   Head- normocephalic, atraumatic Eyes-  Sclera clear, conjunctiva pink Ears- hearing intact Oropharynx- clear Neck- supple, no JVP Lymph- no cervical lymphadenopathy Lungs- Clear to ausculation bilaterally, normal work of  breathing Heart- Regular rate and rhythm, no murmurs, rubs or gallops, PMI not laterally displaced GI- soft, NT, ND, + BS Extremities- no clubbing, cyanosis, or edema MS- no significant deformity or atrophy Skin- no rash or lesion Psych- euthymic mood, full affect Neuro- strength and sensation are intact  EKG- NSR at 63 bpm, pr int 182 ms, qrs int 88 ms, qtc 437 ms 10/17-  Nuclear stress EF: 60%.  Blood pressure demonstrated a normal response to exercise.  There was no ST segment deviation noted during stress.  The study is normal.  This is a low risk study.  The left ventricular ejection fraction is normal (55-65%).   Normal resting and stress perfusion. No ischemia or infarction EF 60%  Assessment and Plan: 1. Paroxsymal afib Maintaining SR on flecainide 50 mg bid Recent stress test normal  Continue metoprolol 100 mg daily   2. Lifestyle risk factors for afib Encouraged use of CPAP Encouraged weight loss Encouraged regular exercise Recommend decrease alcohol to no more than 2 drinks a week  3. HTN Stable Continue hctz, lisinopril, amlodipine   4. CHA2DS2VASc 2-3 for age, htn, impaired blood glucose Pt asked to draw a Hgb A1c today After discussion of available anticoagulants, he wants to start Eliquis 5 mg bid 30 day free supply rx given Bleeding precautions discussed  Stop asa  F/u in afib clinic in 2 months

## 2016-02-28 LAB — HEMOGLOBIN A1C
Hgb A1c MFr Bld: 5.4 % (ref 4.8–5.6)
MEAN PLASMA GLUCOSE: 108 mg/dL

## 2016-03-04 ENCOUNTER — Other Ambulatory Visit (HOSPITAL_COMMUNITY): Payer: Self-pay | Admitting: *Deleted

## 2016-03-04 MED ORDER — APIXABAN 5 MG PO TABS: 5.0000 mg | ORAL_TABLET | Freq: Two times a day (BID) | ORAL | 6 refills | Status: DC

## 2016-03-07 ENCOUNTER — Encounter: Payer: Self-pay | Admitting: Cardiology

## 2016-03-15 ENCOUNTER — Other Ambulatory Visit: Payer: Self-pay | Admitting: *Deleted

## 2016-03-15 ENCOUNTER — Encounter: Payer: Self-pay | Admitting: *Deleted

## 2016-03-15 DIAGNOSIS — G4733 Obstructive sleep apnea (adult) (pediatric): Secondary | ICD-10-CM

## 2016-04-12 ENCOUNTER — Encounter: Payer: Self-pay | Admitting: Internal Medicine

## 2016-04-12 ENCOUNTER — Telehealth: Payer: Self-pay | Admitting: Internal Medicine

## 2016-04-12 NOTE — Telephone Encounter (Signed)
Erroneous entry

## 2016-05-07 ENCOUNTER — Ambulatory Visit (HOSPITAL_BASED_OUTPATIENT_CLINIC_OR_DEPARTMENT_OTHER): Payer: 59 | Attending: Cardiology | Admitting: Cardiology

## 2016-05-07 VITALS — Ht 70.0 in | Wt 245.0 lb

## 2016-05-07 DIAGNOSIS — G4733 Obstructive sleep apnea (adult) (pediatric): Secondary | ICD-10-CM

## 2016-05-09 NOTE — Procedures (Signed)
   Patient Name: Travis [name], Travis [name] Date: 05/07/2016 Gender: Male D.O.B: 1950/10/30 Age (years): 2 Referring Provider: Fransico Him MD, ABSM Height (inches): 70 Interpreting Physician: Fransico Him MD, ABSM Weight (lbs): 245 RPSGT: Zadie Rhine BMI: 35 MRN: [id number] Neck Size: 19.00  CLINICAL INFORMATION The patient is referred for a CPAP titration to treat sleep apnea  Date of NPSG, Split Night or HST:12/15/2015  SLEEP STUDY TECHNIQUE As per the AASM Manual for the Scoring of Sleep and Associated Events v2.3 (April 2016) with a hypopnea requiring 4% desaturations.  The channels recorded and monitored were frontal, central and occipital EEG, electrooculogram (EOG), submentalis EMG (chin), nasal and oral airflow, thoracic and abdominal wall motion, anterior tibialis EMG, snore microphone, electrocardiogram, and pulse oximetry. Continuous positive airway pressure (CPAP) was initiated at the beginning of the study and titrated to treat sleep-disordered breathing.  MEDICATIONS Medications self-administered by patient taken the night of the study : N/A  TECHNICIAN COMMENTS Comments added by technician: PT HAD ONE RESTROOM VISTED. Patient had difficulty initiating sleep.  Comments added by scorer: N/A  RESPIRATORY PARAMETERS Optimal PAP Pressure (cm): 14  AHI at Optimal Pressure (/hr):0.0 Overall Minimal O2 (%):90.00  Supine % at Optimal Pressure (%):68 Minimal O2 at Optimal Pressure (%): 90.0    SLEEP ARCHITECTURE The study was initiated at 9:58:26 PM and ended at 4:37:35 AM.  Sleep onset time was 24.1 minutes and the sleep efficiency was 81.7%. The total sleep time was 326.0 minutes.  The patient spent 3.53% of the night in stage N1 sleep, 71.47% in stage N2 sleep, 0.00% in stage N3 and 25.00% in REM.Stage REM latency was 83.5 minutes  Wake after sleep onset was 49.0. Alpha intrusion was absent. Supine sleep was 56.16%.  CARDIAC DATA The 2 lead EKG demonstrated  sinus rhythm. The mean heart rate was 57.42 beats per minute. Other EKG findings include: None.  LEG MOVEMENT DATA  The total Periodic Limb Movements of Sleep (PLMS) were 14. The PLMS index was 2.58. A PLMS index of <15 is considered normal in adults.  IMPRESSIONS - The optimal PAP pressure was 14 cm of water. - Central sleep apnea was not noted during this titration (CAI = 0.0/h). - Mild oxygen desaturations were observed during this titration (min O2 = 90.00%). - No snoring was audible during this study. - No cardiac abnormalities were observed during this study. - Clinically significant periodic limb movements were not noted during this study. Arousals associated with PLMs were rare.  DIAGNOSIS - Obstructive Sleep Apnea (327.23 [G47.33 ICD-10])  RECOMMENDATIONS - Trial of CPAP therapy on 14 cm H2O with a Medium size Resmed Full Face Mask AirFit F20 mask and heated humidification. - Avoid alcohol, sedatives and other CNS depressants that may worsen sleep apnea and disrupt normal sleep architecture. - Sleep hygiene should be reviewed to assess factors that may improve sleep quality. - Weight management and regular exercise should be initiated or continued. - Return to [facility] for re-evaluation after 10 weeks of   [place], American Board of Sleep Medicine  ELECTRONICALLY SIGNED ON:  05/09/2016, 3:03 PM [place] PH: [phone]   FX: [phone] [place]

## 2016-05-10 ENCOUNTER — Telehealth: Payer: Self-pay | Admitting: *Deleted

## 2016-05-10 NOTE — Telephone Encounter (Signed)
-----   Message from Travis Margarita, MD sent at 05/09/2016  3:07 PM EST ----- Pt had successful PAP titration. Please setup appointment in 10 weeks. Please let [facility] know that order for PAP is in EPIC.

## 2016-05-10 NOTE — Telephone Encounter (Signed)
-----   Message from Sueanne Margarita, MD sent at 05/09/2016  3:07 PM EST ----- Pt had successful PAP titration. Please setup appointment in 10 weeks. Please let [facility] know that order for PAP is in EPIC.

## 2016-05-24 ENCOUNTER — Telehealth: Payer: Self-pay | Admitting: *Deleted

## 2016-05-24 NOTE — Telephone Encounter (Signed)
Late entry-Pt called in to say he needed his medical necessity form signed and faxed in so his medical claim made on 01/26/2016 could be payed through his insurance. The form was faxed in to his insurance and the claim was paid.

## 2016-05-28 ENCOUNTER — Ambulatory Visit (HOSPITAL_COMMUNITY)
Admission: RE | Admit: 2016-05-28 | Discharge: 2016-05-28 | Disposition: A | Discharge status: Home / Self Care | Payer: 59 | Source: Ambulatory Visit | Attending: Nurse Practitioner | Admitting: Nurse Practitioner

## 2016-05-28 ENCOUNTER — Encounter (HOSPITAL_COMMUNITY): Payer: Self-pay | Admitting: Nurse Practitioner

## 2016-05-28 VITALS — BP 130/72 | HR 68 | Ht 70.0 in | Wt 258.0 lb

## 2016-05-28 DIAGNOSIS — I480 Paroxysmal atrial fibrillation: Secondary | ICD-10-CM

## 2016-05-28 DIAGNOSIS — G4733 Obstructive sleep apnea (adult) (pediatric): Secondary | ICD-10-CM | POA: Insufficient documentation

## 2016-05-28 DIAGNOSIS — I10 Essential (primary) hypertension: Secondary | ICD-10-CM | POA: Insufficient documentation

## 2016-05-28 DIAGNOSIS — E669 Obesity, unspecified: Secondary | ICD-10-CM | POA: Insufficient documentation

## 2016-05-28 DIAGNOSIS — Z823 Family history of stroke: Secondary | ICD-10-CM | POA: Insufficient documentation

## 2016-05-28 DIAGNOSIS — Z8249 Family history of ischemic heart disease and other diseases of the circulatory system: Secondary | ICD-10-CM | POA: Diagnosis not present

## 2016-05-28 DIAGNOSIS — Z6837 Body mass index (BMI) 37.0-37.9, adult: Secondary | ICD-10-CM | POA: Insufficient documentation

## 2016-05-28 DIAGNOSIS — Z79899 Other long term (current) drug therapy: Secondary | ICD-10-CM | POA: Insufficient documentation

## 2016-05-28 DIAGNOSIS — R7302 Impaired glucose tolerance (oral): Secondary | ICD-10-CM | POA: Insufficient documentation

## 2016-05-28 DIAGNOSIS — Z7901 Long term (current) use of anticoagulants: Secondary | ICD-10-CM | POA: Insufficient documentation

## 2016-05-28 DIAGNOSIS — Z8672 Personal history of thrombophlebitis: Secondary | ICD-10-CM | POA: Diagnosis not present

## 2016-05-28 LAB — CBC
HEMATOCRIT: 42.8 % (ref 39.0–52.0)
Hemoglobin: 15 g/dL (ref 13.0–17.0)
MCH: 32.3 pg (ref 26.0–34.0)
MCHC: 35 g/dL (ref 30.0–36.0)
MCV: 92 fL (ref 78.0–100.0)
Platelets: 224 10*3/uL (ref 150–400)
RBC: 4.65 MIL/uL (ref 4.22–5.81)
RDW: 13.1 % (ref 11.5–15.5)
WBC: 9.1 10*3/uL (ref 4.0–10.5)

## 2016-05-28 NOTE — Progress Notes (Signed)
Primary Care Physician: [name], MD Referring Physician: Dr. [name] is a 66 y.o. male with a h/o paroxysmal afib diagnosed in June of this year. He was seen last by Dr. Rayann Heman and started on flecainide 50 mg bid. He had a stress test that was low risk on 10/17. He has had very little afib since starting flecainide and feels just a few irregular beats rarely. Dr. Rayann Heman did want to be on blood thinner with age 55 in December, HTN and history of impaired blood glucose. He was started on DOAC in October. . He does drink a glass of wine a night but does not plan to decrease this to 2x weekly. He just recently started CPAP two nights ago.  Today, he denies symptoms of palpitations, chest pain, shortness of breath, orthopnea, PND, lower extremity edema, dizziness, presyncope, syncope, or neurologic sequela. The patient is tolerating medications without difficulties and is otherwise without complaint today.   Past Medical History:  Diagnosis Date  . Anesthesia    Difficult to wake up   . Atypical chest pain   . Hypertension   . Impaired glucose tolerance   . Obese   . Obstructive sleep apnea    noncompliant with CPAP, followed by Dr Radford Pax  . Paroxysmal atrial fibrillation (HCC)   . Thrombophlebitis    Past Surgical History:  Procedure Laterality Date  . FLEXIBLE SIGMOIDOSCOPY N/A 04/15/2015   Procedure:  UNSEDATED FLEXIBLE SIGMOIDOSCOPY;  Surgeon: [name], MD;  Location: [facility];  Service: Endoscopy;  Laterality: N/A;  . TESTICLE SURGERY    . TONSILLECTOMY      Current Outpatient Prescriptions  Medication Sig Dispense Refill  . amLODipine (NORVASC) 10 MG tablet Take 10 mg by mouth daily.    [name] apixaban (ELIQUIS) 5 MG TABS tablet Take 1 tablet (5 mg total) by mouth 2 (two) times daily. 60 tablet 6  . flecainide (TAMBOCOR) 50 MG tablet Take 1 tablet (50 mg total) by mouth 2 (two) times daily. 60 tablet 3  . Folic Acid-Vit Q000111Q 123456  (FOLCAPS PO) Take 1 tablet by mouth daily.    . hydrochlorothiazide (MICROZIDE) 12.5 MG capsule Take 12.5 mg by mouth daily.    [name] lisinopril (PRINIVIL,ZESTRIL) 30 MG tablet Take 30 mg by mouth daily.    . metoprolol succinate (TOPROL-XL) 100 MG 24 hr tablet Take 100 mg by mouth daily. Take with or immediately following a meal.    . psyllium (METAMUCIL) 58.6 % powder Take 1 packet by mouth daily.    . tadalafil (CIALIS) 20 MG tablet Take 20 mg by mouth daily as needed for erectile dysfunction.     No current facility-administered medications for this encounter.     No Known Allergies  Social History   Social History  . Marital status: Married    Spouse name: N/A  . Number of children: N/A  . Years of education: N/A   Occupational History  . Not on file.   Social History Main Topics  . Smoking status: Never Smoker  . Smokeless tobacco: Never Used  . Alcohol use Yes     Comment: daily; a couple of glasses of wine every night   . Drug use: No  . Sexual activity: Not on file   Other Topics Concern  . Not on file   Social History Narrative   Lives in [place] with spouse.  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 Dementia Mother   . Stroke Father   . Atrial fibrillation Father   . Hypertension Father   . Hypertension Paternal Grandfather   . Dementia Maternal Grandmother     ROS- All systems are reviewed and negative except as per the HPI above  Physical Exam: Vitals:   05/28/16 0921  BP: 130/72  Pulse: 68  Weight: 258 lb (117 kg)  Height: 5\' 10"  (1.778 m)    GEN- The patient is well appearing, alert and oriented x 3 today.   Head- normocephalic, atraumatic Eyes-  Sclera clear, conjunctiva pink Ears- hearing intact Oropharynx- clear Neck- supple, no JVP Lymph- no cervical lymphadenopathy Lungs- Clear to ausculation bilaterally, normal work of breathing Heart- Regular rate and rhythm, no murmurs,  rubs or gallops, PMI not laterally displaced GI- soft, NT, ND, + BS Extremities- no clubbing, cyanosis, or edema MS- no significant deformity or atrophy Skin- no rash or lesion Psych- euthymic mood, full affect Neuro- strength and sensation are intact  EKG- NSR at 68 bpm, pr int 174 ms, qrs int 86 ms, qtc 450 ms 10/17-  Nuclear stress EF: 60%.  Blood pressure demonstrated a normal response to exercise.  There was no ST segment deviation noted during stress.  The study is normal.  This is a low risk study.  The left ventricular ejection fraction is normal (55-65%).   Normal resting and stress perfusion. No ischemia or infarction EF 60%  Assessment and Plan: 1. Paroxsymal afib Maintaining SR on flecainide 50 mg bid Recent stress test normal  Continue metoprolol 100 mg daily  2. Lifestyle risk factors for afib Encouraged use of CPAP Encouraged weight loss Encouraged regular exercise Recommend decrease alcohol to no more than 2 drinks a week  3. HTN Stable Continue hctz, lisinopril, amlodipine  4. CHA2DS2VASc 2-3 for age, htn, impaired blood glucose Continue Eliquis 5 mg bid CBC today with recent addition of DOAC  F/u with Dr. Radford Pax and [name] as scheduled  afib clinic as needed  Geroge Baseman. Jameria Bradway, [facility] [address] [place], [place] [address] [phone]

## 2016-06-11 ENCOUNTER — Other Ambulatory Visit (HOSPITAL_COMMUNITY): Payer: Self-pay | Admitting: Internal Medicine

## 2016-07-06 ENCOUNTER — Ambulatory Visit
Admission: RE | Admit: 2016-07-06 | Discharge: 2016-07-06 | Disposition: A | Discharge status: Home / Self Care | Payer: 59 | Source: Ambulatory Visit | Attending: Gastroenterology | Admitting: Gastroenterology

## 2016-07-06 ENCOUNTER — Other Ambulatory Visit: Payer: Self-pay | Admitting: Gastroenterology

## 2016-07-06 DIAGNOSIS — R198 Other specified symptoms and signs involving the digestive system and abdomen: Secondary | ICD-10-CM

## 2016-07-26 ENCOUNTER — Encounter: Payer: Self-pay | Admitting: Cardiology

## 2016-07-26 ENCOUNTER — Ambulatory Visit (INDEPENDENT_AMBULATORY_CARE_PROVIDER_SITE_OTHER): Payer: 59 | Admitting: Cardiology

## 2016-07-26 VITALS — BP 130/80 | HR 52 | Ht 70.0 in | Wt 255.2 lb

## 2016-07-26 DIAGNOSIS — G4733 Obstructive sleep apnea (adult) (pediatric): Secondary | ICD-10-CM | POA: Diagnosis not present

## 2016-07-26 DIAGNOSIS — E669 Obesity, unspecified: Secondary | ICD-10-CM | POA: Diagnosis not present

## 2016-07-26 DIAGNOSIS — I10 Essential (primary) hypertension: Secondary | ICD-10-CM | POA: Diagnosis not present

## 2016-07-26 HISTORY — DX: Obstructive sleep apnea (adult) (pediatric): G47.33

## 2016-07-26 HISTORY — DX: Obesity, unspecified: E66.9

## 2016-07-26 NOTE — Patient Instructions (Signed)

## 2016-07-26 NOTE — Progress Notes (Signed)
Cardiology Office Note    Date:  07/26/2016   ID:  Travis [name], DOB 07/04/1950, MRN [id number]  PCP:  [name], MD  Cardiologist:  Fransico Him, MD   Chief Complaint  Patient presents with  . Sleep Apnea  . Hypertension    History of Present Illness:  Travis [name] is a 66 y.o. male with a history of persistent atrial fibrillation, HTN and OSA.  He was referred for repeat sleep study by [name] due to snoring and awakening with SOB.  His PSG  showed mild OSA with an AHi of 12.5/hr with oxygen desaturations to 85% with nocturnal hypoxemia.  He subsequently underwent CPAP titration to 14cm H2O.  He is now here for followup.  He is doing well with his CPAP therapy.  He tolerates the full face mask and feels the pressure is adequate. He feels rested in the am and has no daytime sleepiness.  He denies any significant mouth or nasal dryness and no nasal congestion.  He does not think that he snores.  He is followed fo his afib by Dr. Rayann Heman and [name].      Past Medical History:  Diagnosis Date  . Anesthesia    Difficult to wake up   . Atypical chest pain   . Hypertension   . Impaired glucose tolerance   . Obesity (BMI 30-39.9) 07/26/2016  . OSA (obstructive sleep apnea) 07/26/2016   Mild with AHI 12.5/hr now on CPAP at 14cm H2O  . Paroxysmal atrial fibrillation (HCC)   . Thrombophlebitis     Past Surgical History:  Procedure Laterality Date  . FLEXIBLE SIGMOIDOSCOPY N/A 04/15/2015   Procedure:  UNSEDATED FLEXIBLE SIGMOIDOSCOPY;  Surgeon: [name], MD;  Location: [facility];  Service: Endoscopy;  Laterality: N/A;  . TESTICLE SURGERY    . TONSILLECTOMY      Current Medications: Current Meds  Medication Sig  . amLODipine (NORVASC) 10 MG tablet Take 10 mg by mouth daily.  [name] apixaban (ELIQUIS) 5 MG TABS tablet Take 1 tablet (5 mg total) by mouth 2 (two) times daily.  . flecainide (TAMBOCOR) 50 MG tablet TAKE 1 TABLET (50 MG TOTAL) BY MOUTH TWO  TIMES DAILY.  [name] Folic Acid-Vit O8-CZY S06 (FOLCAPS PO) Take 1 tablet by mouth daily.  . hydrochlorothiazide (MICROZIDE) 12.5 MG capsule Take 12.5 mg by mouth daily.  [name] lisinopril (PRINIVIL,ZESTRIL) 30 MG tablet Take 30 mg by mouth daily.  . metoprolol succinate (TOPROL-XL) 100 MG 24 hr tablet Take 100 mg by mouth daily. Take with or immediately following a meal.  . psyllium (METAMUCIL) 58.6 % powder Take 1 packet by mouth daily.  . tadalafil (CIALIS) 20 MG tablet Take 20 mg by mouth daily as needed for erectile dysfunction.    Allergies:   Patient has no known allergies.   Social History   Social History  . Marital status: Married    Spouse name: N/A  . Number of children: N/A  . Years of education: N/A   Social History Main Topics  . Smoking status: Never Smoker  . Smokeless tobacco: Never Used  . Alcohol use Yes     Comment: daily; a couple of glasses of wine every night   . Drug use: No  . Sexual activity: Not Asked   Other Topics Concern  . None   Social History Narrative   Lives in [place] with spouse.  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   ROS:   Please see the history of present illness.    ROS All other systems reviewed and are negative.  No flowsheet data found.     PHYSICAL EXAM:   VS:  BP 130/80   Pulse (!) 52   Ht 5\' 10"  (1.778 m)   Wt 255 lb 3.2 oz (115.8 kg)   SpO2 95%   BMI 36.62 kg/m    GEN: Well nourished, well developed, in no acute distress  HEENT: normal  Neck: no JVD, carotid bruits, or masses Cardiac: RRR; no murmurs, rubs, or gallops,no edema.  Intact distal pulses bilaterally.  Respiratory:  clear to auscultation bilaterally, normal work of breathing GI: soft, nontender, nondistended, + BS MS: no  deformity or atrophy  Skin: warm and dry, no rash Neuro:  Alert and Oriented x 3, Strength and sensation are intact Psych: euthymic mood, full affect  Wt Readings from Last 3 Encounters:  07/26/16 255 lb 3.2 oz (115.8 kg)  05/28/16 258 lb (117 kg)  05/07/16 245 lb (111.1 kg)      Studies/Labs Reviewed:   EKG:  EKG is not ordered today.  T  Recent Labs: 02/09/2016: BUN 12; Creatinine, Ser 0.95; Potassium 3.8; Sodium 138 05/28/2016: Hemoglobin 15.0; Platelets 224   Lipid Panel No results found for: CHOL, TRIG, HDL, CHOLHDL, VLDL, LDLCALC, LDLDIRECT  Additional studies/ records that were reviewed today include:  CPAP download    ASSESSMENT:    1. OSA (obstructive sleep apnea)   2. Essential hypertension   3. Obesity (BMI 30-39.9)      PLAN:  In order of problems listed above:  OSA - the patient is tolerating PAP therapy well without any problems. The PAP download was reviewed today and showed an AHI of 1.4/hr on 14 cm H2O with 97% compliance in using more than 4 hours nightly.  The patient has been using and benefiting from CPAP use and will continue to benefit from therapy.  HTN - BP controlled on current meds.  He will continue on amlodipine/diuretic/ACE I and BB Obesity - I have encouraged him to continue on his exercise regimen but increase to 5 times weekly and continue to  cut back on carbs and portions.     Medication Adjustments/Labs and Tests Ordered: Current medicines are reviewed at length with the patient today.  Concerns regarding medicines are outlined above.  Medication changes, Labs and Tests ordered today are listed in the Patient Instructions below.  There are no Patient Instructions on file for this visit.   Signed, Fransico Him, MD  07/26/2016 1:57 PM    [facility] [place], [place], [place]  [address] Phone: [phone]; Fax: [phone]

## 2016-07-29 NOTE — Progress Notes (Signed)
Cardiology Office Note   Date:  07/30/2016   ID:  Aldyn, Toon 02/18/1951, MRN [id number]  PCP:  [name], MD  Cardiologist:   [name], MD   F/U of PAF      History of Present Illness: Travis [name] is a 66 y.o. male with a history of HTN and dizziness  I saw the pt in Sept 2017  Event monitor with SR and Afib,  Echo normal   After I sawa him he was seen in afib clinic  Rx Flecanide  Stress test normal   Started on Eliquis   Started on CPAP    Since seen he has been doing good  Breathing is OK  No palpitations  No CP  No dizziness       Current Meds  Medication Sig  . amLODipine (NORVASC) 10 MG tablet Take 10 mg by mouth daily.  [name] apixaban (ELIQUIS) 5 MG TABS tablet Take 1 tablet (5 mg total) by mouth 2 (two) times daily.  . flecainide (TAMBOCOR) 50 MG tablet TAKE 1 TABLET (50 MG TOTAL) BY MOUTH TWO TIMES DAILY.  [name] Folic Acid-Vit B2-WUX L24 (FOLCAPS PO) Take 1 tablet by mouth daily.  . hydrochlorothiazide (MICROZIDE) 12.5 MG capsule Take 12.5 mg by mouth daily.  [name] lisinopril (PRINIVIL,ZESTRIL) 30 MG tablet Take 30 mg by mouth daily.  . metoprolol succinate (TOPROL-XL) 100 MG 24 hr tablet Take 100 mg by mouth daily. Take with or immediately following a meal.  . psyllium (METAMUCIL) 58.6 % powder Take 1 packet by mouth daily.  . tadalafil (CIALIS) 20 MG tablet Take 20 mg by mouth daily as needed for erectile dysfunction.     Allergies:   Patient has no known allergies.   Past Medical History:  Diagnosis Date  . Anesthesia    Difficult to wake up   . Atypical chest pain   . Hypertension   . Impaired glucose tolerance   . Obesity (BMI 30-39.9) 07/26/2016  . OSA (obstructive sleep apnea) 07/26/2016   Mild with AHI 12.5/hr now on CPAP at 14cm H2O  . Paroxysmal atrial fibrillation (HCC)   . Thrombophlebitis     Past Surgical History:  Procedure Laterality Date  . FLEXIBLE SIGMOIDOSCOPY N/A 04/15/2015   Procedure:  UNSEDATED FLEXIBLE  SIGMOIDOSCOPY;  Surgeon: [name], MD;  Location: [facility];  Service: Endoscopy;  Laterality: N/A;  . TESTICLE SURGERY    . TONSILLECTOMY       Social History:  The patient  reports that he has never smoked. He has never used smokeless tobacco. He reports that he drinks alcohol. He reports that he does not use drugs.   Family History:  The patient's family history includes Atrial fibrillation in his father; Dementia in his maternal grandmother and mother; Hypertension in his father and paternal grandfather; Stroke in his father.    ROS:  Please see the history of present illness. All other systems are reviewed and  Negative to the above problem except as noted.    PHYSICAL EXAM: VS:  BP 130/60 (BP Location: Right Arm, Patient Position: Sitting, Cuff Size: Large)   Pulse 60   Ht 5\' 10"  (1.778 m)   Wt 255 lb 12.8 oz (116 kg)   BMI 36.70 kg/m   GEN: Morbidly obese , in no acute distress  HEENT: normal  Neck: no JVD, carotid bruits, or masses Cardiac: RRR; no murmurs, rubs, or gallops,no edema  Respiratory:  clear to auscultation bilaterally, normal work  of breathing GI: soft, nontender, nondistended, + BS  No hepatomegaly  MS: no deformity Moving all extremities   Skin: warm and dry, no rash Neuro:  Strength and sensation are intact Psych: euthymic mood, full affect   EKG:  EKG is not ordered today.   Lipid Panel No results found for: CHOL, TRIG, HDL, CHOLHDL, VLDL, LDLCALC, LDLDIRECT    Wt Readings from Last 3 Encounters:  07/30/16 255 lb 12.8 oz (116 kg)  07/26/16 255 lb 3.2 oz (115.8 kg)  05/28/16 258 lb (117 kg)      ASSESSMENT AND PLAN:  1  PAF  Clinically in SR   I would keep on same regimen  Check CBC today  2  HTN  Good control  3  OSA  Using CPAP  4  HCM  Discussed wt loss and exercise  Pt says he plans to start routine    F/U in October     Current medicines are reviewed at length with the patient today.  The patient does not have  concerns regarding medicines.  Signed, [name], MD  07/30/2016 3:49 PM    [address], [place], [place]  [address] Phone: [phone]; Fax: [phone]

## 2016-07-30 ENCOUNTER — Encounter: Payer: Self-pay | Admitting: Internal Medicine

## 2016-07-30 ENCOUNTER — Ambulatory Visit (INDEPENDENT_AMBULATORY_CARE_PROVIDER_SITE_OTHER): Payer: 59 | Admitting: Internal Medicine

## 2016-07-30 VITALS — BP 130/60 | HR 60 | Ht 70.0 in | Wt 255.8 lb

## 2016-07-30 DIAGNOSIS — G4733 Obstructive sleep apnea (adult) (pediatric): Secondary | ICD-10-CM | POA: Diagnosis not present

## 2016-07-30 DIAGNOSIS — I10 Essential (primary) hypertension: Secondary | ICD-10-CM

## 2016-07-30 DIAGNOSIS — I480 Paroxysmal atrial fibrillation: Secondary | ICD-10-CM | POA: Diagnosis not present

## 2016-07-30 NOTE — Patient Instructions (Signed)
Your physician wants you to follow-up in: [place] will receive a reminder letter in the mail two months in advance. If you don't receive a letter, please call our office to schedule the follow-up appointment.   If you need a refill on your cardiac medications before your next appointment, please call your pharmacy.

## 2016-08-06 NOTE — Progress Notes (Signed)
Labs requested from medical records, Dr. Carlyle Lipa office.

## 2016-10-10 ENCOUNTER — Encounter (HOSPITAL_COMMUNITY): Payer: Self-pay

## 2016-10-10 ENCOUNTER — Emergency Department (HOSPITAL_COMMUNITY)
Admission: EM | Admit: 2016-10-10 | Discharge: 2016-10-11 | Disposition: A | Discharge status: Home / Self Care | Payer: 59 | Attending: Emergency Medicine | Admitting: Emergency Medicine

## 2016-10-10 ENCOUNTER — Emergency Department (HOSPITAL_COMMUNITY): Payer: 59

## 2016-10-10 DIAGNOSIS — I493 Ventricular premature depolarization: Secondary | ICD-10-CM | POA: Diagnosis not present

## 2016-10-10 DIAGNOSIS — Z79899 Other long term (current) drug therapy: Secondary | ICD-10-CM | POA: Diagnosis not present

## 2016-10-10 DIAGNOSIS — Z6830 Body mass index (BMI) 30.0-30.9, adult: Secondary | ICD-10-CM | POA: Insufficient documentation

## 2016-10-10 DIAGNOSIS — R200 Anesthesia of skin: Secondary | ICD-10-CM | POA: Diagnosis not present

## 2016-10-10 DIAGNOSIS — E876 Hypokalemia: Secondary | ICD-10-CM

## 2016-10-10 DIAGNOSIS — Z7901 Long term (current) use of anticoagulants: Secondary | ICD-10-CM | POA: Diagnosis not present

## 2016-10-10 DIAGNOSIS — I10 Essential (primary) hypertension: Secondary | ICD-10-CM | POA: Diagnosis not present

## 2016-10-10 DIAGNOSIS — I4891 Unspecified atrial fibrillation: Secondary | ICD-10-CM | POA: Diagnosis present

## 2016-10-10 LAB — I-STAT TROPONIN, ED
Troponin i, poc: 0.01 ng/mL (ref 0.00–0.08)
Troponin i, poc: 0.01 ng/mL (ref 0.00–0.08)

## 2016-10-10 LAB — BASIC METABOLIC PANEL
Anion gap: 11 (ref 5–15)
BUN: 17 mg/dL (ref 6–20)
CALCIUM: 9 mg/dL (ref 8.9–10.3)
CO2: 23 mmol/L (ref 22–32)
Chloride: 102 mmol/L (ref 101–111)
Creatinine, Ser: 1.03 mg/dL (ref 0.61–1.24)
Glucose, Bld: 101 mg/dL — ABNORMAL HIGH (ref 65–99)
Potassium: 3.2 mmol/L — ABNORMAL LOW (ref 3.5–5.1)
SODIUM: 136 mmol/L (ref 135–145)

## 2016-10-10 LAB — CBC
HCT: 42.2 % (ref 39.0–52.0)
Hemoglobin: 14.8 g/dL (ref 13.0–17.0)
MCH: 32.2 pg (ref 26.0–34.0)
MCHC: 35.1 g/dL (ref 30.0–36.0)
MCV: 91.7 fL (ref 78.0–100.0)
Platelets: 216 10*3/uL (ref 150–400)
RBC: 4.6 MIL/uL (ref 4.22–5.81)
RDW: 13.3 % (ref 11.5–15.5)
WBC: 11.5 10*3/uL — AB (ref 4.0–10.5)

## 2016-10-10 LAB — MAGNESIUM: MAGNESIUM: 2.2 mg/dL (ref 1.7–2.4)

## 2016-10-10 MED ORDER — POTASSIUM CHLORIDE CRYS ER 20 MEQ PO TBCR: 20.0000 meq | EXTENDED_RELEASE_TABLET | Freq: Every day | ORAL | 0 refills | Status: DC

## 2016-10-10 MED ORDER — POTASSIUM CHLORIDE CRYS ER 20 MEQ PO TBCR
40.0000 meq | EXTENDED_RELEASE_TABLET | Freq: Once | ORAL | Status: AC
  Administered 2016-10-10: 40 meq via ORAL
  Filled 2016-10-10: qty 2

## 2016-10-10 NOTE — ED Triage Notes (Signed)
Patient complains of history of atrial fib and hasn't had any symptoms since starting c-pap. Yesterday had palpitations and today had mild chest pain with radiation to left neck and left arm. No SOB, no nausea. Alert and oriented, NAD

## 2016-10-10 NOTE — Discharge Instructions (Signed)
Your potassium was low today. Low potassium can cause premature contractions, and can trigger atrial fibrillation. Prescription for 10 days of oral potassium. Continue your other medications at current dosage is. Follow-up with your physician and/or cardiologist as needed.

## 2016-10-10 NOTE — ED Provider Notes (Signed)
[facility] DEPT Provider Note   CSN: [id number] Arrival date & time: 10/10/16  [place]     History   Chief Complaint Chief Complaint  Patient presents with  . Chest Pain    HPI Travis [name] is a 66 y.o. male. Chief complaint is left-sided chest pain, palpitations.  HPI: Patient has a history of A. fib. He is on rate control, he is on [place]. He states that he felt like he was in "A. fib all day yesterday". He felt like his heart was going fast and irregular. He was not dyspneic. He did not have chest pain. He states today he feels only occasional irregular beats. However he states he "might have" felt some tightness in his left shoulder yesterday and today. He is a little uncertain. He states that yesterday his vision was a little blurry in his right leg felt a little numb. His left leg always feels numb and he states yesterday they both did. He denies stroke symptoms today with numbness weakness or tingling to his extremities his vision is normal. He is compliant with his medications. He is not having discomfort upon his arrival here tonight.  Past Medical History:  Diagnosis Date  . Anesthesia    Difficult to wake up   . Atypical chest pain   . Hypertension   . Impaired glucose tolerance   . Obesity (BMI 30-39.9) 07/26/2016  . OSA (obstructive sleep apnea) 07/26/2016   Mild with AHI 12.5/hr now on CPAP at 14cm H2O  . Paroxysmal atrial fibrillation (HCC)   . Thrombophlebitis     Patient Active Problem List   Diagnosis Date Noted  . OSA (obstructive sleep apnea) 07/26/2016  . Obesity (BMI 30-39.9) 07/26/2016  . Atrial fibrillation ([place])   . Hypertension   . Chest pain at rest     Past Surgical History:  Procedure Laterality Date  . FLEXIBLE SIGMOIDOSCOPY N/A 04/15/2015   Procedure:  UNSEDATED FLEXIBLE SIGMOIDOSCOPY;  Surgeon: [name], MD;  Location: [facility];  Service: Endoscopy;  Laterality: N/A;  . TESTICLE SURGERY    . TONSILLECTOMY          Home Medications    Prior to Admission medications   Medication Sig Start Date End Date Taking? Authorizing Provider  amLODipine (NORVASC) 10 MG tablet Take 10 mg by mouth daily.   Yes [provider]  apixaban (ELIQUIS) 5 MG TABS tablet Take 1 tablet (5 mg total) by mouth 2 (two) times daily. 03/04/16  Yes Sherran Needs, NP  flecainide (TAMBOCOR) 50 MG tablet TAKE 1 TABLET (50 MG TOTAL) BY MOUTH TWO TIMES DAILY. 06/11/16  Yes Fay Records, MD  Folic Acid-Vit B0-FBP Z02 (FOLCAPS PO) Take 1 tablet by mouth daily.   Yes [provider]  hydrochlorothiazide (MICROZIDE) 12.5 MG capsule Take 12.5 mg by mouth daily.   Yes [provider]  lisinopril (PRINIVIL,ZESTRIL) 30 MG tablet Take 30 mg by mouth daily.   Yes [provider]  metoprolol succinate (TOPROL-XL) 100 MG 24 hr tablet Take 100 mg by mouth daily. Take with or immediately following a meal.   Yes [provider]  psyllium (METAMUCIL) 58.6 % powder Take 1 packet by mouth every evening.    Yes [provider]  tadalafil (CIALIS) 20 MG tablet Take 20 mg by mouth daily as needed for erectile dysfunction.   Yes [provider]    Family History Family History  Problem Relation Age of Onset  . Dementia Mother   .  Stroke Father   . Atrial fibrillation Father   . Hypertension Father   . Hypertension Paternal Grandfather   . Dementia Maternal Grandmother     Social History Social History  Substance Use Topics  . Smoking status: Never Smoker  . Smokeless tobacco: Never Used  . Alcohol use Yes     Comment: daily; a couple of glasses of wine every night      Allergies   Patient has no known allergies.   Review of Systems Review of Systems  Constitutional: Negative for appetite change, chills, diaphoresis, fatigue and fever.  HENT: Negative for mouth sores, sore throat and trouble swallowing.   Eyes: Negative for visual disturbance.  Respiratory: Negative  for cough, chest tightness, shortness of breath and wheezing.   Cardiovascular: Positive for palpitations. Negative for chest pain.  Gastrointestinal: Negative for abdominal distention, abdominal pain, diarrhea, nausea and vomiting.  Endocrine: Negative for polydipsia, polyphagia and polyuria.  Genitourinary: Negative for dysuria, frequency and hematuria.  Musculoskeletal: Negative for gait problem.  Skin: Negative for color change, pallor and rash.  Neurological: Positive for numbness. Negative for dizziness, syncope, light-headedness and headaches.  Hematological: Does not bruise/bleed easily.  Psychiatric/Behavioral: Negative for behavioral problems and confusion.     Physical Exam Updated Vital Signs BP (!) 141/82   Pulse 61   Temp 98.2 F (36.8 C) (Oral)   Resp 14   SpO2 96%   Physical Exam  Constitutional: He is oriented to person, place, and time. He appears well-developed and well-nourished. No distress.  HENT:  Head: Normocephalic.  Eyes: Conjunctivae are normal. Pupils are equal, round, and reactive to light. No scleral icterus.  Neck: Normal range of motion. Neck supple. No thyromegaly present.  Cardiovascular: Normal rate and regular rhythm.  Exam reveals no gallop and no friction rub.   No murmur heard. Pulmonary/Chest: Effort normal and breath sounds normal. No respiratory distress. He has no wheezes. He has no rales.  Abdominal: Soft. Bowel sounds are normal. He exhibits no distension. There is no tenderness. There is no rebound.  Musculoskeletal: Normal range of motion.  Neurological: He is alert and oriented to person, place, and time.  Cranial nerves II through XII intact symmetric. Normal vision. Normal symmetric Strength to shoulder shrug, triceps, biceps, grip,wrist flex/extend,and intrinsics  Norma lsymmetric sensation above and below clavicles, and to all distributions to UEs. Norma symmetric strength to flex/.extend hip and knees, dorsi/plantar flex  ankles. Normal symmetric sensation to all distributions to LEs Patellar and achilles reflexes 1-2+. Downgoing Babinski   Skin: Skin is warm and dry. No rash noted.  Psychiatric: He has a normal mood and affect. His behavior is normal.     ED Treatments / Results  Labs (all labs ordered are listed, but only abnormal results are displayed) Labs Reviewed  BASIC METABOLIC PANEL - Abnormal; Notable for the following:       Result Value   Potassium 3.2 (*)    Glucose, Bld 101 (*)    All other components within normal limits  CBC - Abnormal; Notable for the following:    WBC 11.5 (*)    All other components within normal limits  MAGNESIUM  I-STAT TROPOININ, ED  I-STAT TROPOININ, ED    EKG  EKG Interpretation  Date/Time:  Sunday October 10 2016 18:35:55 EDT Ventricular Rate:  68 PR Interval:  190 QRS Duration: 88 QT Interval:  446 QTC Calculation: 474 R Axis:   69 Text Interpretation:  Sinus rhythm with occasional Premature ventricular  complexes Cannot rule out Anterior infarct , age undetermined Abnormal ECG Confirmed by Tanna Furry [phone]) on 10/10/2016 8:43:32 PM       Radiology Dg Chest 2 View  Result Date: 10/10/2016 CLINICAL DATA:  New onset left arm numbness and left-sided chest tightness beginning yesterday. Atrial fibrillation. EXAM: CHEST  2 VIEW COMPARISON:  CT chest 10/03/2015.  Two-view chest x-ray 05/23/2014 FINDINGS: The heart size is normal. Mild bibasilar atelectasis is present. There is no edema or effusion. No focal airspace disease is present. Mild degenerative changes are present in the thoracic spine. Vertebral body heights maintained. The visualized soft tissues and bony thorax are unremarkable. IMPRESSION: 1. No acute cardiopulmonary disease or significant interval change. Electronically Signed   By: San Morelle M.D.   On: 10/10/2016 19:16    Procedures Procedures (including critical care time)  Medications Ordered in ED Medications  potassium  chloride SA (K-DUR,KLOR-CON) CR tablet 40 mEq (40 mEq Oral Given 10/10/16 2252)     Initial Impression / Assessment and Plan / ED Course  I have reviewed the triage vital signs and the nursing notes.  Pertinent labs & imaging results that were available during my care of the patient were reviewed by me and considered in my medical decision making (see chart for details).   EKG without change. He is in sinus rhythm. He has an occasional PVC which does reproduce his feeling of "irregular beat occasionally. Plan CT of his head. His neurological exam is normal at doubt he has had infarct. First troponin normal. A second troponin, and CT are normal he is appropriate for discharge continue current medications and treatment.  Final Clinical Impressions(s) / ED Diagnoses   Final diagnoses:  PVC's (premature ventricular contractions)    New Prescriptions New Prescriptions   No medications on file     Tanna Furry, MD 10/10/16 2342

## 2016-10-10 NOTE — ED Notes (Signed)
Nurse at bedside drawing labs.

## 2016-10-11 ENCOUNTER — Emergency Department (HOSPITAL_COMMUNITY)
Admission: EM | Admit: 2016-10-11 | Discharge: 2016-10-12 | Disposition: A | Discharge status: Home / Self Care | Payer: 59 | Source: Home / Self Care | Attending: Emergency Medicine | Admitting: Emergency Medicine

## 2016-10-11 ENCOUNTER — Emergency Department (HOSPITAL_COMMUNITY): Payer: 59

## 2016-10-11 ENCOUNTER — Encounter (HOSPITAL_COMMUNITY): Payer: Self-pay | Admitting: *Deleted

## 2016-10-11 DIAGNOSIS — R42 Dizziness and giddiness: Secondary | ICD-10-CM | POA: Insufficient documentation

## 2016-10-11 DIAGNOSIS — Z79899 Other long term (current) drug therapy: Secondary | ICD-10-CM | POA: Insufficient documentation

## 2016-10-11 DIAGNOSIS — Z7901 Long term (current) use of anticoagulants: Secondary | ICD-10-CM

## 2016-10-11 DIAGNOSIS — I10 Essential (primary) hypertension: Secondary | ICD-10-CM | POA: Insufficient documentation

## 2016-10-11 LAB — CBC
HCT: 43.5 % (ref 39.0–52.0)
Hemoglobin: 15 g/dL (ref 13.0–17.0)
MCH: 32.1 pg (ref 26.0–34.0)
MCHC: 34.5 g/dL (ref 30.0–36.0)
MCV: 92.9 fL (ref 78.0–100.0)
PLATELETS: 233 10*3/uL (ref 150–400)
RBC: 4.68 MIL/uL (ref 4.22–5.81)
RDW: 13.3 % (ref 11.5–15.5)
WBC: 8.4 10*3/uL (ref 4.0–10.5)

## 2016-10-11 LAB — BASIC METABOLIC PANEL
Anion gap: 10 (ref 5–15)
BUN: 11 mg/dL (ref 6–20)
CALCIUM: 9 mg/dL (ref 8.9–10.3)
CO2: 23 mmol/L (ref 22–32)
CREATININE: 0.96 mg/dL (ref 0.61–1.24)
Chloride: 105 mmol/L (ref 101–111)
Glucose, Bld: 100 mg/dL — ABNORMAL HIGH (ref 65–99)
Potassium: 3.4 mmol/L — ABNORMAL LOW (ref 3.5–5.1)
SODIUM: 138 mmol/L (ref 135–145)

## 2016-10-11 LAB — I-STAT TROPONIN, ED
Troponin i, poc: 0 ng/mL (ref 0.00–0.08)
Troponin i, poc: 0 ng/mL (ref 0.00–0.08)

## 2016-10-11 MED ORDER — POTASSIUM CHLORIDE CRYS ER 20 MEQ PO TBCR
40.0000 meq | EXTENDED_RELEASE_TABLET | Freq: Once | ORAL | Status: AC
  Administered 2016-10-11: 40 meq via ORAL
  Filled 2016-10-11: qty 2

## 2016-10-11 MED ORDER — POTASSIUM CHLORIDE CRYS ER 20 MEQ PO TBCR: 20.0000 meq | EXTENDED_RELEASE_TABLET | Freq: Once | ORAL | Status: DC

## 2016-10-11 NOTE — ED Provider Notes (Signed)
[facility] DEPT Provider Note   CSN: [id number] Arrival date & time: 10/11/16  7846     History   Chief Complaint Chief Complaint  Patient presents with  . Dizziness    HPI JAYMIN WALN is a 66 y.o. male.  HPI   TRYPP HECKMANN is a 66 y.o. male, with a history of A. fib and HTN, presenting to the ED with  lightheadedness beginning around 1500 today while working at his desk. Lasted intermittently until around 2000. States he would have "pings of afib" followed by lightheadedness. States he thinks he may have been dehydrated because he drank a gatorade and these sensations resolved.  Patient endorses palpitations 2 days ago while playing golf. He was seen in the ED yesterday for dizziness with no signs of A. Fib or concerning findings..  Denies shortness of breath, diaphoresis, N/V/D, chest pain, any other complaints.   Cardiologist: [name]. Afib managed by Dr. Radford Pax and Afib clinic.    Past Medical History:  Diagnosis Date  . Anesthesia    Difficult to wake up   . Atypical chest pain   . Hypertension   . Impaired glucose tolerance   . Obesity (BMI 30-39.9) 07/26/2016  . OSA (obstructive sleep apnea) 07/26/2016   Mild with AHI 12.5/hr now on CPAP at 14cm H2O  . Paroxysmal atrial fibrillation (HCC)   . Thrombophlebitis     Patient Active Problem List   Diagnosis Date Noted  . OSA (obstructive sleep apnea) 07/26/2016  . Obesity (BMI 30-39.9) 07/26/2016  . Atrial fibrillation ([place])   . Hypertension   . Chest pain at rest     Past Surgical History:  Procedure Laterality Date  . FLEXIBLE SIGMOIDOSCOPY N/A 04/15/2015   Procedure:  UNSEDATED FLEXIBLE SIGMOIDOSCOPY;  Surgeon: [name], MD;  Location: [facility];  Service: Endoscopy;  Laterality: N/A;  . TESTICLE SURGERY    . TONSILLECTOMY         Home Medications    Prior to Admission medications   Medication Sig Start Date End Date Taking? Authorizing Provider  amLODipine (NORVASC) 10  MG tablet Take 10 mg by mouth daily.   Yes [provider]  apixaban (ELIQUIS) 5 MG TABS tablet Take 1 tablet (5 mg total) by mouth 2 (two) times daily. 03/04/16  Yes Sherran Needs, NP  flecainide (TAMBOCOR) 50 MG tablet TAKE 1 TABLET (50 MG TOTAL) BY MOUTH TWO TIMES DAILY. 06/11/16  Yes Fay Records, MD  Folic Acid-Vit N6-EXB M84 (FOLCAPS PO) Take 1 tablet by mouth daily.   Yes [provider]  hydrochlorothiazide (MICROZIDE) 12.5 MG capsule Take 12.5 mg by mouth daily.   Yes [provider]  lisinopril (PRINIVIL,ZESTRIL) 30 MG tablet Take 30 mg by mouth daily.   Yes [provider]  metoprolol succinate (TOPROL-XL) 100 MG 24 hr tablet Take 100 mg by mouth daily. Take with or immediately following a meal.   Yes [provider]  psyllium (METAMUCIL) 58.6 % powder Take 1 packet by mouth every evening.    Yes [provider]  tadalafil (CIALIS) 20 MG tablet Take 20 mg by mouth daily as needed for erectile dysfunction.   Yes [provider]  potassium chloride SA (K-DUR,KLOR-CON) 20 MEQ tablet Take 1 tablet (20 mEq total) by mouth daily. Patient not taking: Reported on 10/11/2016 10/10/16   Tanna Furry, MD    Family History Family History  Problem Relation Age of Onset  . Dementia Mother   .  Stroke Father   . Atrial fibrillation Father   . Hypertension Father   . Hypertension Paternal Grandfather   . Dementia Maternal Grandmother     Social History Social History  Substance Use Topics  . Smoking status: Never Smoker  . Smokeless tobacco: Never Used  . Alcohol use Yes     Comment: daily; a couple of glasses of wine every night      Allergies   Patient has no known allergies.   Review of Systems Review of Systems  Constitutional: Negative for chills, diaphoresis and fever.  Eyes: Negative for visual disturbance.  Respiratory: Negative for cough and shortness of breath.   Cardiovascular: Negative for chest pain.    Gastrointestinal: Negative for abdominal pain, diarrhea, nausea and vomiting.  Neurological: Positive for light-headedness (resolved). Negative for syncope, weakness, numbness and headaches.  All other systems reviewed and are negative.    Physical Exam Updated Vital Signs BP (!) 154/95   Pulse 72   Temp 98.9 F (37.2 C) (Oral)   Resp 17   Wt 115.7 kg (255 lb)   SpO2 98%   BMI 36.59 kg/m   Physical Exam  Constitutional: He appears well-developed and well-nourished. No distress.  HENT:  Head: Normocephalic and atraumatic.  Eyes: Conjunctivae and EOM are normal. Pupils are equal, round, and reactive to light.  Neck: Neck supple.  Cardiovascular: Normal rate, regular rhythm, normal heart sounds and intact distal pulses.   Pulmonary/Chest: Effort normal and breath sounds normal. No respiratory distress.  Abdominal: Soft. There is no tenderness. There is no guarding.  Musculoskeletal: He exhibits no edema.  Normal motor function intact in all extremities and spine.  Lymphadenopathy:    He has no cervical adenopathy.  Neurological: He is alert.  No acute sensory deficits. Strength 5/5 in all extremities. No gait disturbance. Coordination intact including heel to shin and finger to nose. Cranial nerves III-XII grossly intact. No facial droop.   Skin: Skin is warm and dry. He is not diaphoretic.  Psychiatric: He has a normal mood and affect. His behavior is normal.  Nursing note and vitals reviewed.    ED Treatments / Results  Labs (all labs ordered are listed, but only abnormal results are displayed) Labs Reviewed  BASIC METABOLIC PANEL - Abnormal; Notable for the following:       Result Value   Potassium 3.4 (*)    Glucose, Bld 100 (*)    All other components within normal limits  CBC  I-STAT TROPOININ, ED  I-STAT TROPOININ, ED    EKG  EKG Interpretation  Date/Time:  Monday October 11 2016 19:31:45 EDT Ventricular Rate:  64 PR Interval:  180 QRS Duration: 88 QT  Interval:  456 QTC Calculation: 470 R Axis:   63 Text Interpretation:  Normal sinus rhythm Normal ECG Confirmed by Hazle Coca [phone]) on 10/11/2016 9:39:34 PM       Radiology Dg Chest 2 View  Result Date: 10/10/2016 CLINICAL DATA:  New onset left arm numbness and left-sided chest tightness beginning yesterday. Atrial fibrillation. EXAM: CHEST  2 VIEW COMPARISON:  CT chest 10/03/2015.  Two-view chest x-ray 05/23/2014 FINDINGS: The heart size is normal. Mild bibasilar atelectasis is present. There is no edema or effusion. No focal airspace disease is present. Mild degenerative changes are present in the thoracic spine. Vertebral body heights maintained. The visualized soft tissues and bony thorax are unremarkable. IMPRESSION: 1. No acute cardiopulmonary disease or significant interval change. Electronically Signed   By: San Morelle  M.D.   On: 10/10/2016 19:16   Ct Head Wo Contrast  Result Date: 10/11/2016 CLINICAL DATA:  Acute onset of palpitations and mild generalized chest pain. Right leg numbness. Initial encounter. EXAM: CT HEAD WITHOUT CONTRAST TECHNIQUE: Contiguous axial images were obtained from the base of the skull through the vertex without intravenous contrast. COMPARISON:  None. FINDINGS: Brain: No evidence of acute infarction, hemorrhage, hydrocephalus, extra-axial collection or mass lesion/mass effect. Prominence of the ventricles and sulci suggests mild cortical volume loss. Mild periventricular and subcortical white matter change likely reflects small vessel ischemic microangiopathy. The brainstem and fourth ventricle are within normal limits. The basal ganglia are unremarkable in appearance. The cerebral hemispheres demonstrate grossly normal gray-white differentiation. No mass effect or midline shift is seen. Vascular: No hyperdense vessel or unexpected calcification. Skull: There is no evidence of fracture; visualized osseous structures are unremarkable in appearance.  Sinuses/Orbits: The orbits are within normal limits. The paranasal sinuses and mastoid air cells are well-aerated. Other: No significant soft tissue abnormalities are seen. IMPRESSION: 1. No acute intracranial pathology seen on CT. 2. Mild cortical volume loss and scattered small vessel ischemic microangiopathy. Electronically Signed   By: Garald Balding M.D.   On: 10/11/2016 00:41    Procedures Procedures (including critical care time)  Medications Ordered in ED Medications  potassium chloride SA (K-DUR,KLOR-CON) CR tablet 40 mEq (40 mEq Oral Given 10/11/16 2345)     Initial Impression / Assessment and Plan / ED Course  I have reviewed the triage vital signs and the nursing notes.  Pertinent labs & imaging results that were available during my care of the patient were reviewed by me and considered in my medical decision making (see chart for details).      Patient presents with previous lightheadedness. Resolved spontaneously prior to my contact with the patient. Lab results encouraging. Negative delta troponins. No A. fib on EKG. Cardiology and A. fib clinic follow-up. Return precautions discussed. Patient voiced understanding of all instructions and is comfortable with, and eager for, discharge.  Findings and plan of care discussed with Sherwood Gambler, MD. Dr. Regenia Skeeter personally evaluated and examined this patient.  Vitals:   10/11/16 2203 10/11/16 2204 10/11/16 2300 10/11/16 2330  BP: (!) 154/95  (!) 148/80 (!) 146/84  Pulse: 72 72 66 61  Resp: 18 17 17 19   Temp:      TempSrc:      SpO2: 98% 98% 97% 97%  Weight:         Final Clinical Impressions(s) / ED Diagnoses   Final diagnoses:  Lightheadedness    New Prescriptions Discharge Medication List as of 10/11/2016 11:53 PM       Liboria Putnam, Helane Gunther, PA-C 10/12/16 0014    Sherwood Gambler, MD 10/13/16 [phone]

## 2016-10-11 NOTE — ED Notes (Signed)
Patient transported to CT 

## 2016-10-11 NOTE — ED Triage Notes (Signed)
Pt was seen yesterday here for dizziness which subsided and he was discharged and saw his PCP today.  Pt began having dizziness again today at 3pm which was worse than his dizziness was yesterday.  Pt also had some intermittent palpitations and tells me that he has a-fib. No focal neuro deficits or slurred speech today.  Pt is alert and oriented at this time.

## 2016-10-11 NOTE — ED Provider Notes (Signed)
Results of CT scan reviewed within normal parameters.  Patient has been discharged per Dr. Jeneen Rinks instructions   [name], NP 10/11/16 9449    Tanna Furry, MD 10/17/16 1520

## 2016-10-11 NOTE — Discharge Instructions (Signed)
Your lab results were encouraging today. Please follow up with the Afib clinic and your cardiologist. Return to the ED as needed.

## 2016-10-12 ENCOUNTER — Other Ambulatory Visit (HOSPITAL_COMMUNITY): Payer: Self-pay | Admitting: Internal Medicine

## 2016-10-12 NOTE — ED Notes (Signed)
Pt stable, understands discharge instructions, and reasons for return.   

## 2016-10-13 ENCOUNTER — Encounter: Payer: Self-pay | Admitting: Cardiovascular Disease

## 2016-10-13 ENCOUNTER — Encounter: Payer: Self-pay | Admitting: *Deleted

## 2016-10-13 ENCOUNTER — Telehealth: Payer: Self-pay | Admitting: Internal Medicine

## 2016-10-13 ENCOUNTER — Ambulatory Visit (INDEPENDENT_AMBULATORY_CARE_PROVIDER_SITE_OTHER): Payer: 59 | Admitting: Cardiovascular Disease

## 2016-10-13 ENCOUNTER — Ambulatory Visit (INDEPENDENT_AMBULATORY_CARE_PROVIDER_SITE_OTHER): Payer: 59

## 2016-10-13 VITALS — BP 140/64 | HR 62 | Ht 70.0 in | Wt 253.8 lb

## 2016-10-13 DIAGNOSIS — I480 Paroxysmal atrial fibrillation: Secondary | ICD-10-CM

## 2016-10-13 NOTE — Patient Instructions (Addendum)
Medication Instructions:  Your physician recommends that you continue on your current medications as directed. Please refer to the Current Medication list given to you today.  Labwork: NONE  Testing/Procedures: Your physician has recommended that you wear an event monitor. Event monitors are medical devices that record the heart's electrical activity. Doctors most often [place] these monitors to diagnose arrhythmias. Arrhythmias are problems with the speed or rhythm of the heartbeat. The monitor is a small, portable device. You can wear one while you do your normal daily activities. This is usually used to diagnose what is causing palpitations/syncope (passing out).  Your physician has requested that you have a  MRI of your head.   Follow-Up: Your physician wants you to follow-up in: 2 weeks with [name] or [facility].   If you need a refill on your cardiac medications before your next appointment, please call your pharmacy.

## 2016-10-13 NOTE — Telephone Encounter (Signed)
Pt  called me this morning   He has been very lightheaded the past two days  Just doesn't feel right Went to ED twice   Sent home  (head CT, labs negatvie)  EKG on 6/10 showed PVCs THis morning he continues to feel bad  Sounds shaky on phone   Some mild tightness in arm I told him I would try to arrange appt for today.with Roderic Palau if possible or as part of DOD slot

## 2016-10-13 NOTE — Telephone Encounter (Signed)
Reviewed with Dr. Johnsie Cancel, DOD.  Patient added to his schedule this morning.  Patient agreeable to plan.

## 2016-10-13 NOTE — Progress Notes (Signed)
Cardiology Office Note   Date:  10/13/2016   ID:  CASY BRUNETTO, DOB 10-05-1950, MRN [id number]  PCP:  Lajean Manes, MD  Cardiologist:   Jenkins Rouge, MD   F/U of PAF      History of Present Illness: Travis [name] is a 66 y.o. male with a history of HTN and dizziness  I saw the pt in Sept 2017  Event monitor with SR and Afib,  Echo normal   After I sawa him he was seen in afib clinic  Rx Flecanide  Stress test normal   Started on Eliquis   Started on CPAP    Seen in ER 10/10/16 notes, labs reviewed. Felt like he was in afib all day 10/09/16 Vision blurry right leg numb No real chest pain Felt heart going fast More dizzy lately Troponin negative CT head normal telemetry only With isolated PVC d/c home  Seen again 10/11/16 lightheadedness while working at desk? Dehydrated resolved with gatorade K 3.4 labs Otherwise ok Hct 43.5   Distant history of neuralgia on top of his head. Has come back now no rash or evidence of zoster No headaches Denies vertigo headache  Concerned since he has trip to [place] and [place] in July  Compliant with his blood thinner  Been on flecainide since December  Spent over 30 minutes just reviewing his ER notes and cardiology notes from afib clinic and Dr Maryagnes Amos with Dr Harrington Challenger personally regarding care    Current Meds  Medication Sig  . amLODipine (NORVASC) 10 MG tablet Take 10 mg by mouth daily.  [name] apixaban (ELIQUIS) 5 MG TABS tablet Take 1 tablet (5 mg total) by mouth 2 (two) times daily.  . flecainide (TAMBOCOR) 50 MG tablet TAKE 1 TABLET (50 MG TOTAL) BY MOUTH TWO TIMES DAILY.  . hydrochlorothiazide (MICROZIDE) 12.5 MG capsule Take 12.5 mg by mouth daily.  [name] lisinopril (PRINIVIL,ZESTRIL) 30 MG tablet Take 30 mg by mouth daily.  . metoprolol succinate (TOPROL-XL) 100 MG 24 hr tablet Take 100 mg by mouth daily. Take with or immediately following a meal.  . psyllium (METAMUCIL) 58.6 % powder Take 1 packet by mouth every evening.   .  tadalafil (CIALIS) 20 MG tablet Take 20 mg by mouth daily as needed for erectile dysfunction.     Allergies:   Patient has no known allergies.   Past Medical History:  Diagnosis Date  . Anesthesia    Difficult to wake up   . Atypical chest pain   . Hypertension   . Impaired glucose tolerance   . Obesity (BMI 30-39.9) 07/26/2016  . OSA (obstructive sleep apnea) 07/26/2016   Mild with AHI 12.5/hr now on CPAP at 14cm H2O  . Paroxysmal atrial fibrillation (HCC)   . Thrombophlebitis     Past Surgical History:  Procedure Laterality Date  . FLEXIBLE SIGMOIDOSCOPY N/A 04/15/2015   Procedure:  UNSEDATED FLEXIBLE SIGMOIDOSCOPY;  Surgeon: [name], MD;  Location: [facility];  Service: Endoscopy;  Laterality: N/A;  . TESTICLE SURGERY    . TONSILLECTOMY       Social History:  The patient  reports that he has never smoked. He has never used smokeless tobacco. He reports that he drinks alcohol. He reports that he does not use drugs.   Family History:  The patient's family history includes Atrial fibrillation in his father; Dementia in his maternal grandmother and mother; Hypertension in his father and paternal grandfather; Stroke in his father.    ROS:  Please see the history of present illness. All other systems are reviewed and  Negative to the above problem except as noted.    PHYSICAL EXAM: VS:  BP 140/64   Pulse 62   Ht 5\' 10"  (1.778 m)   Wt 115.1 kg (253 lb 12.8 oz)   SpO2 98%   BMI 36.42 kg/m   GEN: Morbidly obese , in no acute distress  HEENT: normal  Neck: no JVD, carotid bruits, or masses Cardiac: RRR; no murmurs, rubs, or gallops,no edema  Respiratory:  clear to auscultation bilaterally, normal work of breathing GI: soft, nontender, nondistended, + BS  No hepatomegaly  MS: no deformity Moving all extremities   Skin: warm and dry, no rash Neuro:  Strength and sensation are intact Psych: euthymic mood, full affect   EKG:  SR rate 61 normal including QRS and  QT    Lipid Panel No results found for: CHOL, TRIG, HDL, CHOLHDL, VLDL, LDLCALC, LDLDIRECT    Wt Readings from Last 3 Encounters:  10/13/16 115.1 kg (253 lb 12.8 oz)  10/11/16 115.7 kg (255 lb)  07/30/16 116 kg (255 lb 12.8 oz)      ASSESSMENT AND PLAN:  1  PAF  Clinically in SR    Feels like he is having breakthrough PAF and with dizziness will stop flecainide and give Him event monitor He will taper to 50 mg daily than stop Continue eliquis   2  HTN  Good control  3  OSA  Using CPAP  4  HCM  Discussed wt loss and exercise  Pt says he plans to start routine    5. Dizziness:  CT head negative not postural covering arrhythmia with event monitor MRI head r/o posterior circulation TIA may need to f/u with neurology regarding occipital neuralgia  Total time face to face spent with patient 1 hour including record review   Current medicines are reviewed at length with the patient today.  The patient does not have concerns regarding medicines.  Signed, Jenkins Rouge, MD  10/13/2016 10:26 AM    [facility] [place], [place],   [address] Phone: [phone]; Fax: [phone]

## 2016-10-18 ENCOUNTER — Ambulatory Visit (HOSPITAL_COMMUNITY): Payer: 59 | Admitting: Nurse Practitioner

## 2016-10-20 ENCOUNTER — Telehealth: Payer: Self-pay

## 2016-10-20 NOTE — Telephone Encounter (Signed)
Called patient with previous message. Patient will call Dr. Felipa Eth for referral. Cancelled MRI for now. Patient verbalized understanding.

## 2016-10-20 NOTE — Telephone Encounter (Signed)
Left message for patient to call back. Patient will need his PCP to refer him to neurology in order to have an MRI of his head. MRI was denied by patient's insurance through his cardiologist.

## 2016-10-20 NOTE — Telephone Encounter (Signed)
Patient returning your call, thanks. °

## 2016-10-21 ENCOUNTER — Ambulatory Visit (HOSPITAL_COMMUNITY): Payer: 59

## 2016-10-25 ENCOUNTER — Other Ambulatory Visit (HOSPITAL_COMMUNITY): Payer: Self-pay | Admitting: Nurse Practitioner

## 2016-10-26 ENCOUNTER — Ambulatory Visit (HOSPITAL_COMMUNITY)
Admission: RE | Admit: 2016-10-26 | Discharge: 2016-10-26 | Disposition: A | Discharge status: Home / Self Care | Payer: 59 | Source: Ambulatory Visit | Attending: Nurse Practitioner | Admitting: Nurse Practitioner

## 2016-10-26 ENCOUNTER — Encounter (HOSPITAL_COMMUNITY): Payer: Self-pay | Admitting: Nurse Practitioner

## 2016-10-26 VITALS — BP 122/64 | HR 60 | Ht 70.0 in | Wt 257.4 lb

## 2016-10-26 DIAGNOSIS — Z823 Family history of stroke: Secondary | ICD-10-CM | POA: Insufficient documentation

## 2016-10-26 DIAGNOSIS — Z8249 Family history of ischemic heart disease and other diseases of the circulatory system: Secondary | ICD-10-CM | POA: Diagnosis not present

## 2016-10-26 DIAGNOSIS — G4733 Obstructive sleep apnea (adult) (pediatric): Secondary | ICD-10-CM | POA: Insufficient documentation

## 2016-10-26 DIAGNOSIS — Z7901 Long term (current) use of anticoagulants: Secondary | ICD-10-CM | POA: Insufficient documentation

## 2016-10-26 DIAGNOSIS — E669 Obesity, unspecified: Secondary | ICD-10-CM | POA: Diagnosis not present

## 2016-10-26 DIAGNOSIS — I480 Paroxysmal atrial fibrillation: Secondary | ICD-10-CM | POA: Insufficient documentation

## 2016-10-26 DIAGNOSIS — Z79899 Other long term (current) drug therapy: Secondary | ICD-10-CM | POA: Diagnosis not present

## 2016-10-26 DIAGNOSIS — Z820 Family history of epilepsy and other diseases of the nervous system: Secondary | ICD-10-CM | POA: Diagnosis not present

## 2016-10-26 DIAGNOSIS — Z9889 Other specified postprocedural states: Secondary | ICD-10-CM | POA: Insufficient documentation

## 2016-10-26 DIAGNOSIS — I10 Essential (primary) hypertension: Secondary | ICD-10-CM | POA: Insufficient documentation

## 2016-10-26 NOTE — Progress Notes (Signed)
Primary Care Physician: Travis Manes, MD Referring Physician: Dr. Elta [name] Travis [name] is a 66 y.o. male with a h/o paroxysmal afib diagnosed in June of this year. He was seen last by Dr. Rayann [name] and started on flecainide 50 mg bid. He had a stress test that was low risk on 10/17. He has had very little afib since starting flecainide and feels just a few irregular beats rarely. He does drink a glass of wine a night but does not plan to decrease this to 2x weekly. He is on CPAP.  He is asked to f/u in afib clinic for recent ER visit x 2 back to back. He had played golf one day and went into afib. He continued to paly golf. It was a hot day. The next day he had to pick up someone form the [name] airport and felt some numbness in his left cheek area. He reported it to his wife and the decided to go to the ER. He was on anticoagulation. No significant findings. Thought he may mildly dehydrated, K+ slightly low. He was in SR. He had some strange "neuralgia" feelings on top of his head the next day. Went back to the ER, No acute findings by CT. He was in SR. He then followed up with Dr. Johnsie [name] and flecainide was stopped to see if symptoms could be from the drug. He has had no further symptoms since then and no afib. He is leaving for [place] and [place] next week. Dr. Johnsie [name] ordered a MRI but insurance would not approve and with pt feeling better, he does not plan to have one.  Today, he denies symptoms of palpitations, chest pain, shortness of breath, orthopnea, PND, lower extremity edema, dizziness, presyncope, syncope, or neurologic sequela. The patient is tolerating medications without difficulties and is otherwise without complaint today.   Past Medical History:  Diagnosis Date  . Anesthesia    Difficult to wake up   . Atypical chest pain   . Hypertension   . Impaired glucose tolerance   . Obesity (BMI 30-39.9) 07/26/2016  . OSA (obstructive sleep apnea) 07/26/2016   Mild with AHI  12.5/hr now on CPAP at 14cm H2O  . Paroxysmal atrial fibrillation (HCC)   . Thrombophlebitis    Past Surgical History:  Procedure Laterality Date  . FLEXIBLE SIGMOIDOSCOPY N/A 04/15/2015   Procedure:  UNSEDATED FLEXIBLE SIGMOIDOSCOPY;  Surgeon: Travis Fair, MD;  Location: [facility];  Service: Endoscopy;  Laterality: N/A;  . TESTICLE SURGERY    . TONSILLECTOMY      Current Outpatient Prescriptions  Medication Sig Dispense Refill  . amLODipine (NORVASC) 10 MG tablet Take 10 mg by mouth daily.    [name] ELIQUIS 5 MG TABS tablet Take 1 tablet (5 mg total) by mouth 2 (two) times daily. 60 tablet 6  . hydrochlorothiazide (MICROZIDE) 12.5 MG capsule Take 12.5 mg by mouth daily.    . hyoscyamine (LEVSIN SL) 0.125 MG SL tablet Take 0.125 mg by mouth daily.    [name] lisinopril (PRINIVIL,ZESTRIL) 30 MG tablet Take 30 mg by mouth daily.    . metoprolol succinate (TOPROL-XL) 100 MG 24 hr tablet Take 100 mg by mouth daily. Take with or immediately following a meal.    . psyllium (METAMUCIL) 58.6 % powder Take 1 packet by mouth every evening.     . tadalafil (CIALIS) 20 MG tablet Take 20 mg by mouth daily as needed for erectile dysfunction.     No current  facility-administered medications for this encounter.     No Known Allergies  Social History   Social History  . Marital status: Married    Spouse name: N/A  . Number of children: N/A  . Years of education: N/A   Occupational History  . Not on file.   Social History Main Topics  . Smoking status: Never Smoker  . Smokeless tobacco: Never Used  . Alcohol use Yes     Comment: daily; a couple of glasses of wine every night   . Drug use: No  . Sexual activity: Not on file   Other Topics Concern  . Not on file   Social History Narrative   Lives in [place] with spouse.  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   1 daughter    Family History  Problem Relation Age of Onset  . Dementia Mother   . Stroke Father   . Atrial fibrillation  Father   . Hypertension Father   . Hypertension Paternal Grandfather   . Dementia Maternal Grandmother     ROS- All systems are reviewed and negative except as per the HPI above  Physical Exam: Vitals:   10/26/16 1513  BP: 122/64  Pulse: 60  Weight: 257 lb 6.4 oz (116.8 kg)  Height: 5\' 10"  (1.778 m)    GEN- The patient is well appearing, alert and oriented x 3 today.   Head- normocephalic, atraumatic Eyes-  Sclera clear, conjunctiva pink Ears- hearing intact Oropharynx- clear Neck- supple, no JVP Lymph- no cervical lymphadenopathy Lungs- Clear to ausculation bilaterally, normal work of breathing Heart- Regular rate and rhythm, no murmurs, rubs or gallops, PMI not laterally displaced GI- soft, NT, ND, + BS Extremities- no clubbing, cyanosis, or edema MS- no significant deformity or atrophy Skin- no rash or lesion Psych- euthymic mood, full affect Neuro- strength and sensation are intact  EKG- NSR at 60 bpm, pr int 172 ms, qrs int 82 ms, qtc 430 ms   Assessment and Plan: 1. Paroxsymal afib Off flecainide, hard to say if symptoms were 2/2 flecainide since he had ben on for a while without any symptoms Enjoying SR for now, he leaves to go out of country next week and there is not enough time to start another antiarrythmic Told to take flecainide with him in case he goes into afib and could use as pill in pocket 300 mg x one dose, not to be repeated before 4 days Continue metoprolol 100 mg daily  2. Lifestyle risk factors for afib Encouraged use of CPAP Encouraged weight loss Encouraged regular exercise Recommend decrease alcohol to no more than 2 drinks a week  3. HTN Stable Continue hctz, lisinopril, amlodipine  4. CHA2DS2VASc  3 for age, htn, impaired blood glucose Continue Eliquis 5 mg bid   F/u with  Dr. Ballard [name]. Travis [name]  as scheduled in August Travis [name] afib clinic as needed  Travis [name], [name] [address] [place], [place] [address] [phone]

## 2016-11-01 ENCOUNTER — Telehealth: Payer: Self-pay | Admitting: Internal Medicine

## 2016-11-01 NOTE — Telephone Encounter (Signed)
Spoke with pt, his left leg,which he had phlebitis in the past, swelled over the week end while playing golf. He is leaveing tomorrow and wanted to know if there was anything he should do differently. He has compression hose to wear, he will watch his sodium intake, elevate his legs as much as possible. He will get up and ambulate on the plane as much as possible.

## 2016-11-01 NOTE — Telephone Encounter (Signed)
New Message     Pt c/o swelling: STAT is pt has developed SOB within 24 hours  1. How long have you been experiencing swelling?  Saturday and sunday  2. Where is the swelling located? Lower legs and feet   3.  Are you currently taking a "fluid pill"?  No   4.  Are you currently SOB? no  5.  Have you traveled recently? He is going to be traveling out of the country tomorrow and  Has questions

## 2016-11-19 ENCOUNTER — Telehealth: Payer: Self-pay | Admitting: Physician Assistant

## 2016-11-19 ENCOUNTER — Telehealth: Payer: Self-pay | Admitting: Internal Medicine

## 2016-11-19 NOTE — Telephone Encounter (Signed)
Will fax this clearance note by our PharmD, to Dr Perley Jain office for further review and follow-up with the pt.

## 2016-11-19 NOTE — Telephone Encounter (Signed)
Paged by answering service. Patient is in afib since yesterday despite taking Flecianide 100mg . He was diaphoretic this afternoon but no chest pain, dyspnea, dizziness or syncope. Currently in hotel at [place]. Advised to take total of Flecianide 200mg  once. IF symptoms does not resolves or worsen goes to ER. He is agree with plan.

## 2016-11-19 NOTE — Telephone Encounter (Signed)
Pt on Eliquis for Afib with CHADSVASC of 3 and no history of stroke. Per protocol ok to hold 24 hours prior to procedure.

## 2016-11-19 NOTE — Telephone Encounter (Signed)
Will forward this surgical clearance request to our Pharmacist and Dr Harrington Challenger, for further review, recommendation, and follow-up with Dr Perley Jain office thereafter.

## 2016-11-19 NOTE — Telephone Encounter (Signed)
New message         [facility] Pre-operative Risk Assessment    Request for surgical clearance:  1. What type of surgery is being performed?  colonoscopy  2. When is this surgery scheduled?  11/24/16   3. Are there any medications that need to be held prior to surgery and how long? Eliquis - how long should pt hold   Name of physician performing surgery?   Magod What is your office phone and fax number?  Fax [phone] ofc [phone] Howie Ill 11/19/2016, 1:55 PM  _________________________________________________________________   (provider comments below)

## 2016-11-22 ENCOUNTER — Telehealth: Payer: Self-pay | Admitting: Internal Medicine

## 2016-11-22 NOTE — Telephone Encounter (Signed)
LMTCB

## 2016-11-22 NOTE — Telephone Encounter (Signed)
New message    Pt is calling returning a call he received on 7/20 while he was out of the country. Please call.

## 2016-11-22 NOTE — Telephone Encounter (Signed)
Spoke with patient, states his issue has been taken care of.

## 2016-11-25 ENCOUNTER — Telehealth (HOSPITAL_COMMUNITY): Payer: Self-pay | Admitting: *Deleted

## 2016-11-25 NOTE — Telephone Encounter (Signed)
Pt called in stating he went into afib about 24 hours ago after having colonoscopy. He did take his metoprolol late yesterday. He was concerned taking the PIP flecainide since having anesthesia yesterday. He has not taken his metoprolol yet today. Discussed with Roderic Palau NP go ahead and take regular dose of metoprolol if still in afib in 2 hours take 150mg  of flecainide. If still in afib this afternoon pt will call back to be seen tomorrow. Pt states is having a lot of breakthrough since stopping daily flecainide but symptoms of dizziness have subsided since stopping it daily. May have to discuss different AAD therapy in future. Pt will let us know if still in afib this afternoon.

## 2016-12-28 ENCOUNTER — Ambulatory Visit: Payer: Self-pay | Admitting: Surgery

## 2016-12-28 NOTE — H&P (Signed)
Travis [name] 12/28/2016 8:58 AM Location: [facility] Patient #: [id number] DOB: 1950-05-11 Married / Language: Cleophus Molt / Race: White Male  History of Present Illness Adin Hector MD; 12/28/2016 9:54 AM) The patient is a 66 year old male who presents with hemorrhoids. Note for "Hemorrhoids": ` ` ` Patient sent for surgical consultation at the request of Dr. 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 Takes intermittent flecainide. Is fully anticoagulated on Eliquis. Followed by cardiology. He's had hemorrhoid issues for many years. Stays on Metamucil. Usually moves his bowels a couple times in the morning. Had recent colonoscopy that showed a few small polyps that were removed. A little up in 5 years. However the patient's had worsening problems with his hemorrhoids. More episodes of bleeding. It is intermittently popping out in some areas and out all the time and others. Causes a lot of discomfort for most of all the morning. He started wear pads and other things but still struggles. Wished to have something more aggressive done. Surgical consultation requested.  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 He's not had any anorectal interventions. No abdominal surgery. Usually does not have her bowels. He can walk a half hour without much difficulty.  (Review of systems as stated in this history (HPI) or in the review of systems. Otherwise all other 12 point ROS are negative)   Past Surgical History (Tanisha A. Owens Shark, [place]; 12/28/2016 8:58 AM) Knee Surgery Left. Oral Surgery Tonsillectomy  Diagnostic Studies History (Tanisha A. Owens Shark, [place]; 12/28/2016 8:58 AM) Colonoscopy within last year  Allergies (Tanisha A. Owens Shark, [place]; 12/28/2016 9:00 AM) No Known Drug Allergies 12/28/2016 Allergies Reconciled  Medication History (Tanisha A. Owens Shark, [place];  12/28/2016 9:02 AM) AmLODIPine Besylate (10MG  Tablet, Oral) Active. Eliquis (5MG  Tablet, Oral) Active. Flecainide Acetate (50MG  Tablet, Oral) Active. HydroCHLOROthiazide (12.5MG  Capsule, Oral) Active. Metoprolol Succinate ER (100MG  Tablet ER 24HR, Oral) Active. Vitamin B-12 (1000MCG Tablet, Oral) Active. Doxycycline Hyclate (100MG  Tablet, Oral) Active. Medications Reconciled  Social History (Tanisha A. Owens Shark, [place]; 12/28/2016 8:59 AM) Alcohol use Moderate alcohol use. Caffeine use Coffee. No drug use Tobacco use Never smoker.  Family History (Tanisha A. Owens Shark, [place]; 12/28/2016 8:58 AM) Arthritis Father. Cerebrovascular Accident Father. Heart disease in male family member before age 34 Heart disease in male family member before age 35 Hypertension Sister. Migraine Headache Daughter. Prostate Cancer Father.  Other Problems (Tanisha A. Owens Shark, [place]; 12/28/2016 8:59 AM) Arthritis Atrial Fibrillation Gastroesophageal Reflux Disease Hemorrhoids High blood pressure     Review of Systems (Tanisha A. Brown RMA; 12/28/2016 8:59 AM) General Present- Night Sweats. Not Present- Appetite Loss, Chills, Fatigue, Fever, Weight Gain and Weight Loss. Skin Present- Rash. Not Present- Change in Wart/Mole, Dryness, Hives, Jaundice, New Lesions, Non-Healing Wounds and Ulcer. HEENT Present- Wears glasses/contact lenses. Not Present- Earache, Hearing Loss, Hoarseness, Nose Bleed, Oral Ulcers, Ringing in the Ears, Seasonal Allergies, Sinus Pain, Sore Throat, Visual Disturbances and Yellow Eyes. Respiratory Present- Snoring. Not Present- Bloody sputum, Chronic Cough, Difficulty Breathing and Wheezing. Breast Not Present- Breast Mass, Breast Pain, Nipple Discharge and Skin Changes. Cardiovascular Present- Palpitations. Not Present- Chest Pain, Difficulty Breathing Lying Down, Leg Cramps, Rapid Heart Rate, Shortness of Breath and Swelling of Extremities. Gastrointestinal Present- Change  in Bowel Habits and Hemorrhoids. Not Present- Abdominal Pain, Bloating, Bloody Stool, Chronic diarrhea, Constipation, Difficulty Swallowing, Excessive gas, Gets full quickly at meals, Indigestion, Nausea, Rectal Pain and  Vomiting. Male Genitourinary Not Present- Blood in Urine, Change in Urinary Stream, Frequency, Impotence, Nocturia, Painful Urination, Urgency and Urine Leakage. Musculoskeletal Present- Joint Stiffness and Swelling of Extremities. Not Present- Back Pain, Joint Pain, Muscle Pain and Muscle Weakness. Neurological Present- Numbness and Tingling. Not Present- Decreased Memory, Fainting, Headaches, Seizures, Tremor, Trouble walking and Weakness. Psychiatric Not Present- Anxiety, Bipolar, Change in Sleep Pattern, Depression, Fearful and Frequent crying. Endocrine Not Present- Cold Intolerance, Excessive Hunger, Hair Changes, Heat Intolerance, Hot flashes and New Diabetes. Hematology Present- Blood Thinners. Not Present- Easy Bruising, Excessive bleeding, Gland problems, HIV and Persistent Infections.  Vitals (Tanisha A. Brown RMA; 12/28/2016 9:00 AM) 12/28/2016 8:59 AM Weight: 249.6 lb Height: 70in Body Surface Area: 2.29 m Body Mass Index: 35.81 kg/m  Temp.: 97.40F  Pulse: 64 (Regular)  P.OX: 97% (Room air) BP: 126/72 (Sitting, Left Arm, Standard)      Physical Exam Adin Hector MD; 12/28/2016 9:40 AM)  General Mental Status-Alert. General Appearance-Not in acute distress, Not Sickly. Orientation-Oriented X3. Hydration-Well hydrated. Voice-Normal.  Integumentary Global Assessment Upon inspection and palpation of skin surfaces of the - Axillae: non-tender, no inflammation or ulceration, no drainage. and Distribution of scalp and body hair is normal. General Characteristics Temperature - normal warmth is noted.  Head and Neck Head-normocephalic, atraumatic with no lesions or palpable masses. Face Global Assessment - atraumatic, no absence of  expression. Neck Global Assessment - no abnormal movements, no bruit auscultated on the right, no bruit auscultated on the left, no decreased range of motion, non-tender. Trachea-midline. Thyroid Gland Characteristics - non-tender.  Eye Eyeball - Left-Extraocular movements intact, No Nystagmus. Eyeball - Right-Extraocular movements intact, No Nystagmus. Cornea - Left-No Hazy. Cornea - Right-No Hazy. Sclera/Conjunctiva - Left-No scleral icterus, No Discharge. Sclera/Conjunctiva - Right-No scleral icterus, No Discharge. Pupil - Left-Direct reaction to light normal. Pupil - Right-Direct reaction to light normal.  ENMT Ears Pinna - Left - no drainage observed, no generalized tenderness observed. Right - no drainage observed, no generalized tenderness observed. Nose and Sinuses External Inspection of the Nose - no destructive lesion observed. Inspection of the nares - Left - quiet respiration. Right - quiet respiration. Mouth and Throat Lips - Upper Lip - no fissures observed, no pallor noted. Lower Lip - no fissures observed, no pallor noted. Nasopharynx - no discharge present. Oral Cavity/Oropharynx - Tongue - no dryness observed. Oral Mucosa - no cyanosis observed. Hypopharynx - no evidence of airway distress observed.  Chest and Lung Exam Inspection Movements - Normal and Symmetrical. Accessory muscles - No use of accessory muscles in breathing. Palpation Palpation of the chest reveals - Non-tender. Auscultation Breath sounds - Normal and Clear.  Cardiovascular Auscultation Rhythm - Regular. Murmurs & Other Heart Sounds - Auscultation of the heart reveals - No Murmurs and No Systolic Clicks.  Abdomen Inspection Inspection of the abdomen reveals - No Visible peristalsis and No Abnormal pulsations. Umbilicus - No Bleeding, No Urine drainage. Palpation/Percussion Palpation and Percussion of the abdomen reveal - Soft, Non Tender, No Rebound tenderness, No  Rigidity (guarding) and No Cutaneous hyperesthesia. Note: Abdomen obese but soft. Nontender. Not distended. Moderate diastases recti. Small periumbilical bulging consistent with chronically incarcerated umbilical hernia. Asymptomatic. No guarding.  Male Genitourinary Sexual Maturity Tanner 5 - Adult hair pattern and Adult penile size and shape.  Rectal Note: Please to refer to anoscopy section. Grade 3 and 4 hemorrhoids, especially left-sided.  Peripheral Vascular Upper Extremity Inspection - Left - No Cyanotic nailbeds, Not Ischemic. Right - No  Cyanotic nailbeds, Not Ischemic.  Neurologic Neurologic evaluation reveals -normal attention span and ability to concentrate, able to name objects and repeat phrases. Appropriate fund of knowledge , normal sensation and normal coordination. Mental Status Affect - not angry, not paranoid. Cranial Nerves-Normal Bilaterally. Gait-Normal.  Neuropsychiatric Mental status exam performed with findings of-able to articulate well with normal speech/language, rate, volume and coherence, thought content normal with ability to perform basic computations and apply abstract reasoning and no evidence of hallucinations, delusions, obsessions or homicidal/suicidal ideation.  Musculoskeletal Global Assessment Spine, Ribs and Pelvis - no instability, subluxation or laxity. Right Upper Extremity - no instability, subluxation or laxity.  Lymphatic Head & Neck  General Head & Neck Lymphatics: Bilateral - Description - No Localized lymphadenopathy. Axillary  General Axillary Region: Bilateral - Description - No Localized lymphadenopathy. Femoral & Inguinal  Generalized Femoral & Inguinal Lymphatics: Left - Description - No Localized lymphadenopathy. Right - Description - No Localized lymphadenopathy.   Results Adin Hector MD; 12/28/2016 9:48 AM) Procedures  Name Value Date Hemorrhoids Procedure Internal exam: Internal Hemorroids  ( non-bleeding) prolapse Other: Large left lateral hemorrhoid chronically prolapsed grade 4. Internal hemorrhoid pedunculated intermittently prolapsing as well. Grade 3 Rather inflamed and irritated. Friable. Grade 2 right posterior internal hemorrhoid Perianal skin clean with good hygiene. No pruritis ani. No pilonidal disease. No fissure. No abscess/fistula. Normal sphincter tone.  No condyloma warts. Tolerates digital and anoscopic rectal exam. No other rectal masses.  Performed: 12/28/2016 9:36 AM    Assessment & Plan Adin Hector MD; 12/28/2016 9:42 AM)  PROLAPSED INTERNAL HEMORRHOIDS, GRADE 4 (K64.3)  PROLAPSED INTERNAL HEMORRHOIDS, GRADE 3 (K64.2)  INTERNAL BLEEDING HEMORRHOIDS (K64.8) Impression: Chronically prolapsed and prolapsing hemorrhoids with friability and bleeding in patient requires full anticoagulation for his intermittent atrial fibrillation. Worsening pain symptoms and discomfort.  I think he would benefit from treatment to remove the hemorrhoids. Because they are prolapsed intermittently prolapsing, I do not think he is amenable to banding. That usually does not control bleeding well anyway. I think this would require surgical hemorrhoidal ligation with pexy of all piles and resection of the obvious prolapsed material.  Usually this is an outpatient surgery.  He is fully anticoagulated. Would hold his Eliquis 2 days prior to surgery as long as cardiology agrees. Don't know if cardiology wants to patient watched overnight or if can go home. He has good exercise tolerance. His A. fib is only intermittent. Don't know if they want his flecainide more regularly or not.  Current Plans ANOSCOPY, DIAGNOSTIC (97353) Pt Education - [facility] Hemorrhoids (Sadarius Norman): discussed with patient and provided information. The anatomy & physiology of the anorectal region was discussed. The pathophysiology of hemorrhoids and differential diagnosis was discussed. Natural history  risks without surgery was discussed. I stressed the importance of a bowel regimen to have daily soft bowel movements to minimize progression of disease. Interventions such as sclerotherapy & banding were discussed.  The patient's symptoms are not adequately controlled by medicines and other non-operative treatments. I feel the risks & problems of no surgery outweigh the operative risks; therefore, I recommended surgery to treat the hemorrhoids by ligation, pexy, and possible resection.  Risks such as bleeding, infection, urinary difficulties, need for further treatment, heart attack, death, and other risks were discussed. I noted a good likelihood this will help address the problem. Goals of post-operative recovery were discussed as well. Possibility that this will not correct all symptoms was explained. Post-operative pain, bleeding, constipation, and other problems after surgery  were discussed. We will work to minimize complications. Educational handouts further explaining the pathology, treatment options, and bowel regimen were given as well. Questions were answered. The patient expresses understanding & wishes to proceed with surgery.  ENCOUNTER FOR PREOPERATIVE EXAMINATION FOR GENERAL SURGICAL PROCEDURE (Z01.818)  Current Plans You are being scheduled for surgery- Our schedulers will call you.  You should hear from our office's scheduling department within 5 working days about the location, date, and time of surgery. We try to make accommodations for patient's preferences in scheduling surgery, but sometimes the OR schedule or the surgeon's schedule prevents [place] from making those accommodations.  If you have not heard from our office [phone]) in 5 working days, call the office and ask for your surgeon's nurse.  If you have other questions about your diagnosis, plan, or surgery, call the office and ask for your surgeon's nurse.  Pt Education - [facility] Rectal Prep for Anorectal  outpatient/office surgery: discussed with patient and provided information. Pt Education - [facility] Rectal Surgery HCI (Kamie Korber): discussed with patient and provided information. Pt Education - [place] (Zury Fazzino) ANTICOAGULATED (Z79.01) Impression: He is fully anticoagulated for his intermittent paroxysmal atrial fibrillation.  Ideally would hold his Eliquis 48 hours prior to surgery and then restarted on postop day 1. Would like input from his cardiologist to make sure that is a safe plan. Usually this is an outpatient surgery and less cardiology feels strongly he needs to stay overnight. Don't know if they want him to stay on his flecainide perioperatively just in case  Current Plans I recommended obtaining preoperative cardiac clearance. I am concerned about the health of the patient and the ability to tolerate the operation. Therefore, we will request clearance by cardiology to better assess operative risk & see if a reevaluation, further workup, etc is needed. Also recommendations on how medications such as for anticoagulation and blood pressure should be managed/held/restarted after surgery. Pt Education - [facility] Hold anticoagulation preoperatively INCARCERATED UMBILICAL HERNIA (U13.2) Impression: Small umbilical hernia incarcerated with moderate diastases recti  It is small and asymptomatic.. I would leave it alone.  DIASTASIS RECTI (M62.08)  Current Plans Pt Education - [facility] Diastasis Recti: discussed with patient and provided information.  Adin Hector, M.D., F.A.C.S. Gastrointestinal and Minimally Invasive Surgery [facility] [address] [place], [place] [address] [phone] Main / Paging

## 2017-01-04 ENCOUNTER — Encounter: Payer: Self-pay | Admitting: Internal Medicine

## 2017-01-04 NOTE — Telephone Encounter (Signed)
Pt needs BMET and Magnesium.  Can get on Thusday when comes to clinic

## 2017-01-04 NOTE — Telephone Encounter (Signed)
Called patient in response to his advice request.  Offered appointment at [facility] clinic tomorrow, he is unable to come tomorrow.  Added to Dr. Alan Ripper schedule Thursday at 11:40 am.  He continues to have PVCs, Friday had to lie down due to lightheadedness.

## 2017-01-05 NOTE — Progress Notes (Signed)
Cardiology Office Note   Date:  01/06/2017   ID:  [name], DOB 1950-11-24, MRN [id number]  PCP:  Lajean Manes, MD  Cardiologist:   [name], MD   F/U of PAF      History of Present Illness: Travis [name] is a 66 y.o. male with a history of HTN and dizziness  I saw the pt in Sept 2017  Event monitor with SR and Afib,  Echo normal   After I sawa him he was seen in afib clinic  Rx Flecanide  Stress test normal   Started on Eliquis   Started on CPAP   Flecanide was stopped this summer when pt had a "spell"  No documented arrhythmia  MRI negative SInce then the pt has had intermitt episodes of atrial fib  Takes 3 flecanide and eases  The pt called earlier this week complaining of increased PVCs  (He has Chad app)  Noted some lightheadednes   Appt made for today  He woke up with atrial fibrllation   Currently not dizzy Different from PVCs  Took 150 mg Flecanide several hours ago        Current Meds  Medication Sig  . amLODipine (NORVASC) 10 MG tablet Take 10 mg by mouth daily.  [name] ELIQUIS 5 MG TABS tablet Take 1 tablet (5 mg total) by mouth 2 (two) times daily.  . hydrochlorothiazide (MICROZIDE) 12.5 MG capsule Take 12.5 mg by mouth daily.  [name] lisinopril (PRINIVIL,ZESTRIL) 30 MG tablet Take 30 mg by mouth daily.  . metoprolol succinate (TOPROL-XL) 100 MG 24 hr tablet Take 100 mg by mouth daily. Take with or immediately following a meal.  . psyllium (METAMUCIL) 58.6 % powder Take 1 packet by mouth every evening.   . tadalafil (CIALIS) 20 MG tablet Take 20 mg by mouth daily as needed for erectile dysfunction.     Allergies:   Patient has no known allergies.   Past Medical History:  Diagnosis Date  . Anesthesia    Difficult to wake up   . Atypical chest pain   . Hypertension   . Impaired glucose tolerance   . Obesity (BMI 30-39.9) 07/26/2016  . OSA (obstructive sleep apnea) 07/26/2016   Mild with AHI 12.5/hr now on CPAP at 14cm H2O  . Paroxysmal atrial  fibrillation (HCC)   . Thrombophlebitis     Past Surgical History:  Procedure Laterality Date  . FLEXIBLE SIGMOIDOSCOPY N/A 04/15/2015   Procedure:  UNSEDATED FLEXIBLE SIGMOIDOSCOPY;  Surgeon: [name], MD;  Location: [facility];  Service: Endoscopy;  Laterality: N/A;  . TESTICLE SURGERY    . TONSILLECTOMY       Social History:  The patient  reports that he has never smoked. He has never used smokeless tobacco. He reports that he drinks alcohol. He reports that he does not use drugs.   Family History:  The patient's family history includes Atrial fibrillation in his father; Dementia in his maternal grandmother and mother; Hypertension in his father and paternal grandfather; Stroke in his father.    ROS:  Please see the history of present illness. All other systems are reviewed and  Negative to the above problem except as noted.    PHYSICAL EXAM: VS:  BP 112/80 (BP Location: Left Arm)   Pulse 100   Ht 5\' 10"  (1.778 m)   Wt 248 lb (112.5 kg)   BMI 35.58 kg/m   GEN: Morbidly obese , in no acute distress  HEENT: normal  Neck: no JVD, carotid bruits, or masses Cardiac:Irreg irreg  ; no murmurs, rubs, or gallops,no edema  Respiratory:  clear to auscultation bilaterally, normal work of breathing GI: soft, nontender, nondistended, + BS  No hepatomegaly  MS: no deformity Moving all extremities   Skin: warm and dry, no rash Neuro:  Strength and sensation are intact Psych: euthymic mood, full affect   EKG:  EKG is ordered today.  Atrial fibrlllation  92 bpm     Lipid Panel No results found for: CHOL, TRIG, HDL, CHOLHDL, VLDL, LDLCALC, LDLDIRECT    Wt Readings from Last 3 Encounters:  01/06/17 248 lb (112.5 kg)  10/26/16 257 lb 6.4 oz (116.8 kg)  10/13/16 253 lb 12.8 oz (115.1 kg)      ASSESSMENT AND PLAN:  1  PAF  Back in atrial fibrillation .  Rates OK  I would recomm resuming  50 bid  Keep on NOAC   WIll sched a f/u with J Allred  (saw pt during  hospitalization last fall; has been seen by Maximino Greenland several times)  Need to discuss other intervention Discussed EtOH  He says he only has a couple glasses of wine  Needs to cut back    2  PVCs   FOllow  2  HTN  Good control  Follow    3  OSA  Using CPAP  4  HCM  WIll need to follow wt  Discuss on future visits  Is down a little     F/U in October     Current medicines are reviewed at length with the patient today.  The patient does not have concerns regarding medicines.  Signed, [name], MD  01/06/2017 12:38 PM    [address], [place], [place]  [address] Phone: [phone]; Fax: [phone]

## 2017-01-06 ENCOUNTER — Ambulatory Visit (INDEPENDENT_AMBULATORY_CARE_PROVIDER_SITE_OTHER): Payer: 59 | Admitting: Internal Medicine

## 2017-01-06 ENCOUNTER — Encounter: Payer: Self-pay | Admitting: Internal Medicine

## 2017-01-06 VITALS — BP 112/80 | HR 100 | Ht 70.0 in | Wt 248.0 lb

## 2017-01-06 DIAGNOSIS — I10 Essential (primary) hypertension: Secondary | ICD-10-CM | POA: Diagnosis not present

## 2017-01-06 DIAGNOSIS — G4733 Obstructive sleep apnea (adult) (pediatric): Secondary | ICD-10-CM

## 2017-01-06 DIAGNOSIS — I480 Paroxysmal atrial fibrillation: Secondary | ICD-10-CM

## 2017-01-06 DIAGNOSIS — I4819 Other persistent atrial fibrillation: Secondary | ICD-10-CM

## 2017-01-06 DIAGNOSIS — I481 Persistent atrial fibrillation: Secondary | ICD-10-CM

## 2017-01-06 MED ORDER — FLECAINIDE ACETATE 50 MG PO TABS: 50.0000 mg | ORAL_TABLET | Freq: Two times a day (BID) | ORAL | 11 refills | Status: DC

## 2017-01-06 NOTE — Patient Instructions (Addendum)
Your physician has recommended you make the following change in your medication:  Flecainide 50 mg two times a day  Your physician recommends that you return for lab work today (BMET, TSH, MG CBC, BNP)  Your physician recommends that you schedule a follow-up appointment in: October, 2019 [name].    Addendum:  Pt is scheduled with Dr. Rayann Heman 01/24/17 to discuss treatments/plan of care.  1 mo F/U with [name] has been cancelled.

## 2017-01-07 LAB — MAGNESIUM: MAGNESIUM: 2.3 mg/dL (ref 1.6–2.3)

## 2017-01-07 LAB — CBC
Hematocrit: 48.5 % (ref 37.5–51.0)
Hemoglobin: 16.5 g/dL (ref 13.0–17.7)
MCH: 32.2 pg (ref 26.6–33.0)
MCHC: 34 g/dL (ref 31.5–35.7)
MCV: 95 fL (ref 79–97)
PLATELETS: 273 10*3/uL (ref 150–379)
RBC: 5.12 x10E6/uL (ref 4.14–5.80)
RDW: 13.9 % (ref 12.3–15.4)
WBC: 9.8 10*3/uL (ref 3.4–10.8)

## 2017-01-07 LAB — BASIC METABOLIC PANEL
BUN / CREAT RATIO: 16 (ref 10–24)
BUN: 14 mg/dL (ref 8–27)
CO2: 27 mmol/L (ref 20–29)
Calcium: 9.4 mg/dL (ref 8.6–10.2)
Chloride: 100 mmol/L (ref 96–106)
Creatinine, Ser: 0.87 mg/dL (ref 0.76–1.27)
GFR calc Af Amer: 105 mL/min/{1.73_m2} (ref >59)
GFR calc non Af Amer: 91 mL/min/{1.73_m2} (ref >59)
GLUCOSE: 96 mg/dL (ref 65–99)
POTASSIUM: 3.8 mmol/L (ref 3.5–5.2)
SODIUM: 140 mmol/L (ref 134–144)

## 2017-01-07 LAB — TSH: TSH: 2.11 u[IU]/mL (ref 0.450–4.500)

## 2017-01-07 LAB — PRO B NATRIURETIC PEPTIDE: NT-Pro BNP: 813 pg/mL — ABNORMAL HIGH (ref 0–376)

## 2017-01-17 ENCOUNTER — Encounter: Payer: Self-pay | Admitting: Neurology

## 2017-01-19 ENCOUNTER — Telehealth: Payer: Self-pay | Admitting: Internal Medicine

## 2017-01-19 NOTE — Telephone Encounter (Signed)
Received call from pt He is at work  Dizzy  Not in afib  No PVCs (has Kardia device) Took meds at 8  Had juice and coffee  I recomm increasing fluids and salt Cut back on amlodipine to 5 mg at mid day BP cuff at work Call back later with response  ALos -- pt said on Saturday in [place]  Did ok  Sat down to watch football  Says he :"went out" for 3 seconds  When awaike back in [place]

## 2017-01-21 NOTE — Progress Notes (Signed)
Reviewed with T Turner Please sched for sinus CT Hx dizziness/  Eval for sinusitis Use nasal saline 2x per day

## 2017-01-24 ENCOUNTER — Ambulatory Visit (INDEPENDENT_AMBULATORY_CARE_PROVIDER_SITE_OTHER): Payer: 59 | Admitting: Internal Medicine

## 2017-01-24 ENCOUNTER — Encounter: Payer: Self-pay | Admitting: Internal Medicine

## 2017-01-24 VITALS — BP 130/70 | HR 58 | Ht 70.0 in | Wt 251.0 lb

## 2017-01-24 DIAGNOSIS — I119 Hypertensive heart disease without heart failure: Secondary | ICD-10-CM | POA: Diagnosis not present

## 2017-01-24 DIAGNOSIS — I480 Paroxysmal atrial fibrillation: Secondary | ICD-10-CM | POA: Diagnosis not present

## 2017-01-24 DIAGNOSIS — G4733 Obstructive sleep apnea (adult) (pediatric): Secondary | ICD-10-CM

## 2017-01-24 NOTE — Progress Notes (Signed)
Electrophysiology Office Note   Date:  01/24/2017   ID:  Travis [name], Travis [name] 10-09-1950, MRN [id number]  PCP:  Lajean Manes, MD  Cardiologist:  Dr Harrington Challenger Primary Electrophysiologist: Thompson Grayer, MD    Chief Complaint  Patient presents with  . Atrial Fibrillation     History of Present Illness: Travis [name] is a 66 y.o. male who presents today for electrophysiology evaluation.   He presents today for further AF management.  I saw him last 10/17.  He was started on flecainide.  He had no afib for about 6 months.  Unfortunately, he has had increasing episodes of afib since that time.  He also has some dizziness (not presyncope) with quick head turn which is felt to be due to flecainide.  He has OSA and uses CPAP.  He is on eliquis.  He has hemorrhoids for which he needs surgery.  Today, he denies symptoms of palpitations, chest pain, shortness of breath, orthopnea, PND, lower extremity edema, claudication, dizziness, presyncope, syncope, bleeding, or neurologic sequela. The patient is tolerating medications without difficulties and is otherwise without complaint today.    Past Medical History:  Diagnosis Date  . Anesthesia    Difficult to wake up   . Hypertension   . Impaired glucose tolerance   . Obesity (BMI 30-39.9) 07/26/2016  . OSA (obstructive sleep apnea) 07/26/2016   Mild with AHI 12.5/hr now on CPAP at 14cm H2O  . Paroxysmal atrial fibrillation (HCC)   . Thrombophlebitis    Past Surgical History:  Procedure Laterality Date  . FLEXIBLE SIGMOIDOSCOPY N/A 04/15/2015   Procedure:  UNSEDATED FLEXIBLE SIGMOIDOSCOPY;  Surgeon: [name], MD;  Location: [facility];  Service: Endoscopy;  Laterality: N/A;  . TESTICLE SURGERY    . TONSILLECTOMY       Current Outpatient Prescriptions  Medication Sig Dispense Refill  . amLODipine (NORVASC) 10 MG tablet Take 5 mg by mouth daily.     [name] ELIQUIS 5 MG TABS tablet Take 1 tablet (5 mg total) by mouth 2 (two) times  daily. 60 tablet 6  . flecainide (TAMBOCOR) 50 MG tablet Take 1 tablet (50 mg total) by mouth 2 (two) times daily. 60 tablet 11  . hydrochlorothiazide (MICROZIDE) 12.5 MG capsule Take 12.5 mg by mouth daily.    [name] lisinopril (PRINIVIL,ZESTRIL) 30 MG tablet Take 30 mg by mouth daily.    . metoprolol succinate (TOPROL-XL) 100 MG 24 hr tablet Take 100 mg by mouth daily. Take with or immediately following a meal.    . psyllium (METAMUCIL) 58.6 % powder Take 1 packet by mouth every evening.     . tadalafil (CIALIS) 20 MG tablet Take 20 mg by mouth daily as needed for erectile dysfunction.     No current facility-administered medications for this visit.     Allergies:   Patient has no known allergies.   Social History:  The patient  reports that he has never smoked. He has never used smokeless tobacco. He reports that he drinks alcohol. He reports that he does not use drugs.   Family History:  The patient's  family history includes Atrial fibrillation in his father; Dementia in his maternal grandmother and mother; Hypertension in his father and paternal grandfather; Stroke in his father.    ROS:  Please see the history of present illness.   All other systems are personally reviewed and negative.    PHYSICAL EXAM: VS:  BP 130/70   Pulse (!) 58   Ht  5\' 10"  (1.778 m)   Wt 251 lb (113.9 kg)   SpO2 97%   BMI 36.01 kg/m  , BMI Body mass index is 36.01 kg/m.  GEN: Well nourished, well developed, in no acute distress  HEENT: normal  Neck: no JVD, carotid bruits, or masses Cardiac: RRR; no murmurs, rubs, or gallops,no edema  Respiratory:  clear to auscultation bilaterally, normal work of breathing GI: soft, nontender, nondistended, + BS MS: no deformity or atrophy  Skin: warm and dry  Neuro:  Strength and sensation are intact Psych: euthymic mood, full affect  EKG:  EKG is ordered today. The ekg ordered today is personally reviewed and shows sinus rhythm, 56 bpm, Qtc 441 msec   Recent  Labs: 01/06/2017: BUN 14; Creatinine, Ser 0.87; Hemoglobin 16.5; Magnesium 2.3; NT-Pro BNP 813; Platelets 273; Potassium 3.8; Sodium 140; TSH 2.110  personally reviewed   Lipid Panel  No results found for: CHOL, TRIG, HDL, CHOLHDL, VLDL, LDLCALC, LDLDIRECT personally reviewed   Wt Readings from Last 3 Encounters:  01/24/17 251 lb (113.9 kg)  01/06/17 248 lb (112.5 kg)  10/26/16 257 lb 6.4 oz (116.8 kg)      Other studies personally reviewed: Additional studies/ records that were reviewed today include: Dr Harrington Challenger' notes, prior AF clinic notes, my consult 2017, echo  Review of the above records today demonstrates: as above   ASSESSMENT AND PLAN:  1.  Paroxysmal atrial fibrillation The patient has symptomatic afib.  He has failed medical therapy with flecainide.  chads2vasc score is 2.  He is on eliquis.  Therapeutic strategies for afib including medicine and ablation were discussed in detail with the patient today. Risk, benefits, and alternatives to EP study and radiofrequency ablation for afib were also discussed in detail today. These risks include but are not limited to stroke, bleeding, vascular damage, tamponade, perforation, damage to the esophagus, lungs, and other structures, pulmonary vein stenosis, worsening renal function, and death. The patient understands these risk and wishes to proceed.  He wishes to have hemorrhoid surgery prior to ablation.   He will need to be back on anticoagulation for at least 3 weeks before we proceed.  He will also require cardiac CT prior to ablation to exclude LAA thrombus.  He will contact our office when he is ready to proceed. If this is more than 6 weeks from now, he will also require another office visit for further discussions  2. Obesity Body mass index is 36.01 kg/m. Lifestyle modification encouraged  3. OSA Compliant with CPAP  4. Hypertensive cardiovascular disease Stable No change required today   Current medicines are reviewed at  length with the patient today.   The patient does not have concerns regarding his medicines.  The following changes were made today:  none    Signed, Thompson Grayer, MD  01/24/2017 10:49 AM     [facility] [address] [place] [address] [phone] (office) [phone] (fax)

## 2017-01-24 NOTE — Patient Instructions (Signed)
Medication Instructions:  Your physician recommends that you continue on your current medications as directed. Please refer to the Current Medication list given to you today.   Labwork: None ordered   Testing/Procedures: Non-Cardiac CT Angiography (CTA), is a special type of CT scan that uses a computer to produce multi-dimensional views of major blood vessels throughout the body. In CT angiography, a contrast material is injected through an IV to help visualize the blood vessels  Your physician has recommended that you have an ablation. Catheter ablation is a medical procedure used to treat some cardiac arrhythmias (irregular heartbeats). During catheter ablation, a long, thin, flexible tube is put into a blood vessel in your groin (upper thigh), or neck. This tube is called an ablation catheter. It is then guided to your heart through the blood vessel. Radio frequency waves destroy small areas of heart tissue where abnormal heartbeats may cause an arrhythmia to start. Please see the instruction sheet given to you today.     Follow-Up: Your physician recommends that you schedule a follow-up appointment ----please call and schedule follow up after urology procedure.  Will come in and see Dr Rayann Heman prior to having cardiac CT and ablation scheduled   Any Other Special Instructions Will Be Listed Below (If Applicable).   Cardiac Ablation  Cardiac ablation is a procedure to disable (ablate) a small amount of heart tissue in very specific places. The heart has many electrical connections. Sometimes these connections are abnormal and can cause the heart to beat very fast or irregularly. Ablating some of the problem areas can improve the heart rhythm or return it to normal. Ablation may be done for people who:    Have fast heart rhythms (tachycardia).  Have taken medicines for an abnormal heart rhythm (arrhythmia) that were not effective or caused side effects.  Have a high-risk heartbeat  that may be life-threatening.  During the procedure, a small incision is made in the neck or the groin, and a long, thin, flexible tube (catheter) is inserted into the incision and moved to the heart. Small devices (electrodes) on the tip of the catheter will send out electrical currents. A type of X-ray (fluoroscopy) will be used to help guide the catheter and to provide images of the heart. Tell a health care provider about:  Any allergies you have.  All medicines you are taking, including vitamins, herbs, eye drops, creams, and over-the-counter medicines.  Any problems you or family members have had with anesthetic medicines.  Any blood disorders you have.  Any surgeries you have had.  Any medical conditions you have, such as kidney failure.  Whether you are pregnant or may be pregnant. What are the risks? Generally, this is a safe procedure. However, problems may occur, including:  Infection.  Bruising and bleeding at the catheter insertion site.  Bleeding into the chest, especially into the sac that surrounds the heart. This is a serious complication.  Stroke or blood clots.  Damage to other structures or organs.  Allergic reaction to medicines or dyes.  Need for a permanent pacemaker if the normal electrical system is damaged. A pacemaker is a small computer that sends electrical signals to the heart and helps your heart beat normally.  The procedure not being fully effective. This may not be recognized until months later. Repeat ablation procedures are sometimes required.  What happens before the procedure?  Follow instructions from your health care provider about eating or drinking restrictions.  Ask your health care provider about: ?  Changing or stopping your regular medicines. This is especially important if you are taking diabetes medicines or blood thinners. ? Taking medicines such as aspirin and ibuprofen. These medicines can thin your blood. Do not take these  medicines before your procedure if your health care provider instructs you not to.  Plan to have someone take you home from the hospital or clinic.  If you will be going home right after the procedure, plan to have someone with you for 24 hours. What happens during the procedure?  To lower your risk of infection: ? Your health care team will wash or sanitize their hands. ? Your skin will be washed with soap. ? Hair may be removed from the incision area.  An IV tube will be inserted into one of your veins.  You will be given a medicine to help you relax (sedative).  The skin on your neck or groin will be numbed.  An incision will be made in your neck or your groin.  A needle will be inserted through the incision and into a large vein in your neck or groin.  A catheter will be inserted into the needle and moved to your heart.  Dye may be injected through the catheter to help your surgeon see the area of the heart that needs treatment.  Electrical currents will be sent from the catheter to ablate heart tissue in desired areas. There are three types of energy that may be used to ablate heart tissue: ? Heat (radiofrequency energy). ? Laser energy. ? Extreme cold (cryoablation).  When the necessary tissue has been ablated, the catheter will be removed.  Pressure will be held on the catheter insertion area to prevent excessive bleeding.  A bandage (dressing) will be placed over the catheter insertion area. The procedure may vary among health care providers and hospitals. What happens after the procedure?  Your blood pressure, heart rate, breathing rate, and blood oxygen level will be monitored until the medicines you were given have worn off.  Your catheter insertion area will be monitored for bleeding. You will need to lie still for a few hours to ensure that you do not bleed from the catheter insertion area.  Do not drive for 24 hours or as long as directed by your health care  provider. Summary  Cardiac ablation is a procedure to disable (ablate) a small amount of heart tissue in very specific places. Ablating some of the problem areas can improve the heart rhythm or return it to normal.  During the procedure, electrical currents will be sent from the catheter to ablate heart tissue in desired areas. This information is not intended to replace advice given to you by your health care provider. Make sure you discuss any questions you have with your health care provider. Document Released: 09/05/2008 Document Revised: 03/08/2016 Document Reviewed: 03/08/2016 Elsevier Interactive Patient Education  [employer].      If you need a refill on your cardiac medications before your next appointment, please call your pharmacy.

## 2017-01-26 ENCOUNTER — Telehealth: Payer: Self-pay | Admitting: *Deleted

## 2017-01-26 DIAGNOSIS — H81399 Other peripheral vertigo, unspecified ear: Secondary | ICD-10-CM

## 2017-01-26 NOTE — Telephone Encounter (Signed)
Pt reported to [name] sinus drainage, vertigo feeling-dizziness.   MD recommended sinus CT scan and saline nasal spray BID.  Order placed for CT, spoke with CT scheduling Marzetta Board) and scheduled patient for 01/31/17 at 8:30 am.  Left patient detailed message on his mobile number with this information.  Asked him to call back to confirm appointment and advised we can change the date/time if necessary.

## 2017-01-28 NOTE — Telephone Encounter (Signed)
maxillofacial ct now scheduled for 10/2.

## 2017-01-31 ENCOUNTER — Inpatient Hospital Stay: Admission: RE | Admit: 2017-01-31 | Payer: 59 | Source: Ambulatory Visit

## 2017-02-01 ENCOUNTER — Ambulatory Visit (INDEPENDENT_AMBULATORY_CARE_PROVIDER_SITE_OTHER)
Admission: RE | Admit: 2017-02-01 | Discharge: 2017-02-01 | Disposition: A | Discharge status: Home / Self Care | Payer: 59 | Source: Ambulatory Visit | Attending: Internal Medicine | Admitting: Internal Medicine

## 2017-02-01 DIAGNOSIS — H81399 Other peripheral vertigo, unspecified ear: Secondary | ICD-10-CM

## 2017-02-02 NOTE — Progress Notes (Signed)
Cardiology Office Note   Date:  02/03/2017   ID:  DENVER BENTSON, DOB 1950/08/01, MRN [id number]  PCP:  Lajean Manes, MD  Cardiologist:   [name], MD   F/U of PAF      History of Present Illness: ARTEMIO DOBIE is a 67 y.o. male with a history of HTN and dizziness, sleep apena and atrial fib   I saw him in early September  Pt has been seen by D Carrroll in past as well  Felt failed flecanide  \ When I saw him he said he felt dizzy due to PVCs      Today he says he has been in atrial fib since yesterday    Not dizzy at present   Feels different than when has PVCs     He admits to increased caffeine today     Current Meds  Medication Sig  . amLODipine (NORVASC) 10 MG tablet Take 5 mg by mouth daily.   [name] ELIQUIS 5 MG TABS tablet Take 1 tablet (5 mg total) by mouth 2 (two) times daily.  . flecainide (TAMBOCOR) 50 MG tablet Take 1 tablet (50 mg total) by mouth 2 (two) times daily.  . hydrochlorothiazide (MICROZIDE) 12.5 MG capsule Take 12.5 mg by mouth daily.  [name] lisinopril (PRINIVIL,ZESTRIL) 30 MG tablet Take 30 mg by mouth daily.  . metoprolol succinate (TOPROL-XL) 100 MG 24 hr tablet Take 100 mg by mouth daily. Take with or immediately following a meal.  . psyllium (METAMUCIL) 58.6 % powder Take 1 packet by mouth every evening.   . tadalafil (CIALIS) 20 MG tablet Take 20 mg by mouth daily as needed for erectile dysfunction.     Allergies:   Patient has no known allergies.   Past Medical History:  Diagnosis Date  . Anesthesia    Difficult to wake up   . Hypertension   . Impaired glucose tolerance   . Obesity (BMI 30-39.9) 07/26/2016  . OSA (obstructive sleep apnea) 07/26/2016   Mild with AHI 12.5/hr now on CPAP at 14cm H2O  . Paroxysmal atrial fibrillation (HCC)   . Thrombophlebitis     Past Surgical History:  Procedure Laterality Date  . FLEXIBLE SIGMOIDOSCOPY N/A 04/15/2015   Procedure:  UNSEDATED FLEXIBLE SIGMOIDOSCOPY;  Surgeon: [name], MD;   Location: [facility];  Service: Endoscopy;  Laterality: N/A;  . TESTICLE SURGERY    . TONSILLECTOMY       Social History:  The patient  reports that he has never smoked. He has never used smokeless tobacco. He reports that he drinks alcohol. He reports that he does not use drugs.   Family History:  The patient's family history includes Atrial fibrillation in his father; Dementia in his maternal grandmother and mother; Hypertension in his father and paternal grandfather; Stroke in his father.    ROS:  Please see the history of present illness. All other systems are reviewed and  Negative to the above problem except as noted.    PHYSICAL EXAM: VS:  BP 108/62   Pulse (!) 111   Ht 5\' 10"  (1.778 m)   Wt 252 lb 3.2 oz (114.4 kg)   SpO2 97%   BMI 36.19 kg/m   GEN: Morbidly obese , in no acute distress  HEENT: normal  Neck: no JVD, carotid bruits, or masses Cardiac:Irreg irreg No o murmurs, rubs, or gallops,no edema  Respiratory:  clear to auscultation bilaterally, normal work of breathing GI: soft, nontender, nondistended, + BS  No hepatomegaly  MS: no deformity Moving all extremities   Skin: warm and dry, no rash Neuro:  Strength and sensation are intact Psych: euthymic mood, full affect   EKG:  EKG is ordered today.  Atrial fibrlllation 111  bpm  Occasional PVC     Lipid Panel No results found for: CHOL, TRIG, HDL, CHOLHDL, VLDL, LDLCALC, LDLDIRECT    Wt Readings from Last 3 Encounters:  02/03/17 252 lb 3.2 oz (114.4 kg)  01/24/17 251 lb (113.9 kg)  01/06/17 248 lb (112.5 kg)      ASSESSMENT AND PLAN:  1  PAF Pt is in Atrial fibrillatoin    He says that now he would like to have ablation before hemorrhoid surgery  WIll forward to J Allred  Keep on meds for now  Cut back on caffeine and EtOH   2  PVCs   He thinks dizzienss is associated with PVCs  I would recomm increasing metoprolol to 100 am 50 pm  Back down on lisinopril to 15    3  HTN Changes as noted above    With increase in b blocker will cut lisinopril in 1.2    4.  Dizzienss  CT of sinuses negative  Pt thinks dizzy spells related to PVCs    OSA  Using CPAP  Will increae b blockade   4  HCM  Wt is up         Current medicines are reviewed at length with the patient today.  The patient does not have concerns regarding medicines.  Signed, [name], MD  02/03/2017 3:32 PM    [facility] [place], [place], [place]  [address] Phone: [phone]; Fax: [phone]

## 2017-02-03 ENCOUNTER — Encounter: Payer: Self-pay | Admitting: Internal Medicine

## 2017-02-03 ENCOUNTER — Ambulatory Visit (INDEPENDENT_AMBULATORY_CARE_PROVIDER_SITE_OTHER): Payer: 59 | Admitting: Internal Medicine

## 2017-02-03 ENCOUNTER — Ambulatory Visit: Payer: 59 | Admitting: Internal Medicine

## 2017-02-03 VITALS — BP 108/62 | HR 111 | Ht 70.0 in | Wt 252.2 lb

## 2017-02-03 DIAGNOSIS — G4733 Obstructive sleep apnea (adult) (pediatric): Secondary | ICD-10-CM

## 2017-02-03 DIAGNOSIS — I119 Hypertensive heart disease without heart failure: Secondary | ICD-10-CM

## 2017-02-03 DIAGNOSIS — R42 Dizziness and giddiness: Secondary | ICD-10-CM

## 2017-02-03 DIAGNOSIS — I480 Paroxysmal atrial fibrillation: Secondary | ICD-10-CM | POA: Diagnosis not present

## 2017-02-03 MED ORDER — METOPROLOL SUCCINATE ER 100 MG PO TB24: ORAL_TABLET | ORAL | 3 refills | Status: DC

## 2017-02-03 NOTE — Patient Instructions (Signed)
Your physician has recommended you make the following change in your medication:  1.) TAKE 1/2 OF YOUR LISINOPRIL TABLET ONCE A DAY 2.) INCREASE METOPROLOL TO 100 MG EVERY MORNING AND 50 MG EVERY EVENING.

## 2017-02-07 NOTE — Addendum Note (Signed)
Addended by: Patterson Hammersmith A on: 02/07/2017 09:36 AM   Modules accepted: Orders

## 2017-02-09 ENCOUNTER — Telehealth: Payer: Self-pay | Admitting: *Deleted

## 2017-02-09 NOTE — Telephone Encounter (Addendum)
Spoke with patient and he is going to have his surgery on 02/24/17 and have the ablation in Dec. He was unaware he would need to stay on blood thinners for 3-4 months after the procedure.  He has to stop his Eliquis before the procedure and will resume it after the procedure when MD directs him to restart.  He will need to be back on for 3 weeks prior to scheduling the ablation.  Due to needing anticoagulation for 3 weeks prior to scheduling and healing time from surgery we will schedule for 04/05/17.

## 2017-02-15 NOTE — Patient Instructions (Addendum)
Travis [name]  02/15/2017   Your procedure is scheduled on: 02-24-17  Report to [facility] Main  Entrance Take [place]  elevators to 3rd floor to  [place] at 1015AM.    Call this number if you have problems the morning of surgery [phone]    Remember: ONLY 1 PERSON MAY GO WITH YOU TO SHORT STAY TO GET  READY MORNING OF YOUR SURGERY.    Do not eat food or drink liquids :After Midnight.     Take these medicines the morning of surgery with A SIP OF WATER: amlodipine, metoprolol , flecainide(tambocor), nasal spray as needed (may bring to hospital)                                You may not have any metal on your body including hair pins and              piercings  Do not wear jewelry, make-up, lotions, powders or perfumes, deodorant                  Men may shave face and neck.   Do not bring valuables to the hospital. [place].  Contacts, dentures or bridgework may not be worn into surgery.      Patients discharged the day of surgery will not be allowed to drive home.  Name and phone number of your driver:  Special Instructions: N/A              Please read over the following fact sheets you were given: _____________________________________________________________________   CLEAR LIQUID DIET   Foods Allowed                                                                     Foods Excluded  Coffee and tea, regular and decaf                             liquids that you cannot  Plain Jell-O in any flavor                                             see through such as: Fruit ices (not with fruit pulp)                                     milk, soups, orange juice  Iced Popsicles                                    All solid food  Carbonated beverages, regular and diet  Cranberry, grape and apple juices Sports drinks like Gatorade Lightly seasoned clear  broth or consume(fat free) Sugar, honey syrup  Sample Menu Breakfast                                Lunch                                     Supper Cranberry juice                    Beef broth                            Chicken broth Jell-O                                     Grape juice                           Apple juice Coffee or tea                        Jell-O                                      Popsicle                                                Coffee or tea                        Coffee or tea  _____________________________________________________________________   [facility] PREP INSTRUCTIONS for Anal/Rectal Surgery:  Focus on drinking liquids for 1-2 days prior to surgery Obtain what you need at a pharmacy of your choice:  A bottle of Milk of Magnesia   DAY PRIOR TO SURGERY: Wednesday 02-23-17  Switch to drinking liquids or pureed foods only  1:00pm   Take 2 oz (8 tablespoons) Milk of Magnesia. (Repeat if no effect in 2 hours)   Midnight: Do not eat or drink anything after midnight the night before your surgery.   MORNING OF PROCEDURE: Thursday 02-24-17  Remember to not to drink or eat anything that morning Take a Fleet enema     If you have questions or problems, please call [facility] [phone] to speak to someone in the clinic department at our office   [facility] - Preparing for Surgery Before surgery, you can play an important role.  Because skin is not sterile, your skin needs to be as free of germs as possible.  You can reduce the number of germs on your skin by washing with CHG (chlorahexidine gluconate) soap before surgery.  CHG is an antiseptic cleaner which kills germs and bonds with the skin to continue killing germs even after washing. Please DO NOT use if you have an allergy to CHG or antibacterial soaps.  If your skin becomes reddened/irritated stop using the CHG and inform your nurse when you arrive at Short Stay.  Do not  shave (including legs and underarms) for at least 48 hours prior to the first CHG shower.  You may shave your face/neck. Please follow these instructions carefully:  1.  Shower with CHG Soap the night before surgery and the  morning of Surgery.  2.  If you choose to wash your hair, wash your hair first as usual with your  normal  shampoo.  3.  After you shampoo, rinse your hair and body thoroughly to remove the  shampoo.                           4.  Use CHG as you would any other liquid soap.  You can apply chg directly  to the skin and wash                       Gently with a scrungie or clean washcloth.  5.  Apply the CHG Soap to your body ONLY FROM THE NECK DOWN.   Do not use on face/ open                           Wound or open sores. Avoid contact with eyes, ears mouth and genitals (private parts).                       Wash face,  Genitals (private parts) with your normal soap.             6.  Wash thoroughly, paying special attention to the area where your surgery  will be performed.  7.  Thoroughly rinse your body with warm water from the neck down.  8.  DO NOT shower/wash with your normal soap after using and rinsing off  the CHG Soap.                9.  Pat yourself dry with a clean towel.            10.  Wear clean pajamas.            11.  Place clean sheets on your bed the night of your first shower and do not  sleep with pets. Day of Surgery : Do not apply any lotions/deodorants the morning of surgery.  Please wear clean clothes to the hospital/surgery center.  FAILURE TO FOLLOW THESE INSTRUCTIONS MAY RESULT IN THE CANCELLATION OF YOUR SURGERY PATIENT SIGNATURE_________________________________  NURSE SIGNATURE__________________________________  ________________________________________________________________________

## 2017-02-15 NOTE — Progress Notes (Signed)
[place] cardiology Dr [name] 02-03-17 epic  EKG 02-03-17 epic  CXR 10-10-16 epic  Stress Test 02-17-16 epic  ECHO 11-28-15 epic  CT chest 10-03-15 epic

## 2017-02-16 ENCOUNTER — Encounter (HOSPITAL_COMMUNITY)
Admission: RE | Admit: 2017-02-16 | Discharge: 2017-02-16 | Disposition: A | Discharge status: Home / Self Care | Payer: 59 | Source: Ambulatory Visit | Attending: Surgery | Admitting: Surgery

## 2017-02-16 ENCOUNTER — Encounter (HOSPITAL_COMMUNITY): Payer: Self-pay

## 2017-02-16 DIAGNOSIS — K649 Unspecified hemorrhoids: Secondary | ICD-10-CM | POA: Diagnosis not present

## 2017-02-16 DIAGNOSIS — Z01812 Encounter for preprocedural laboratory examination: Secondary | ICD-10-CM | POA: Diagnosis not present

## 2017-02-16 HISTORY — DX: Cardiac arrhythmia, unspecified: I49.9

## 2017-02-16 LAB — BASIC METABOLIC PANEL
Anion gap: 9 (ref 5–15)
BUN: 15 mg/dL (ref 6–20)
CALCIUM: 9 mg/dL (ref 8.9–10.3)
CO2: 29 mmol/L (ref 22–32)
CREATININE: 0.98 mg/dL (ref 0.61–1.24)
Chloride: 103 mmol/L (ref 101–111)
GLUCOSE: 108 mg/dL — AB (ref 65–99)
Potassium: 3.6 mmol/L (ref 3.5–5.1)
SODIUM: 141 mmol/L (ref 135–145)

## 2017-02-16 LAB — CBC
HCT: 43.2 % (ref 39.0–52.0)
HEMOGLOBIN: 15.1 g/dL (ref 13.0–17.0)
MCH: 32.5 pg (ref 26.0–34.0)
MCHC: 35 g/dL (ref 30.0–36.0)
MCV: 92.9 fL (ref 78.0–100.0)
Platelets: 226 10*3/uL (ref 150–400)
RBC: 4.65 MIL/uL (ref 4.22–5.81)
RDW: 13.1 % (ref 11.5–15.5)
WBC: 9.7 10*3/uL (ref 4.0–10.5)

## 2017-02-24 ENCOUNTER — Ambulatory Visit (HOSPITAL_COMMUNITY)
Admission: RE | Admit: 2017-02-24 | Discharge: 2017-02-24 | Disposition: A | Discharge status: Home / Self Care | Payer: 59 | Source: Ambulatory Visit | Attending: Surgery | Admitting: Surgery

## 2017-02-24 ENCOUNTER — Ambulatory Visit (HOSPITAL_COMMUNITY): Payer: 59 | Admitting: Registered Nurse

## 2017-02-24 ENCOUNTER — Encounter (HOSPITAL_COMMUNITY)
Admission: RE | Disposition: A | Discharge status: Home / Self Care | Payer: Self-pay | Source: Ambulatory Visit | Attending: Surgery

## 2017-02-24 ENCOUNTER — Encounter (HOSPITAL_COMMUNITY): Payer: Self-pay | Admitting: *Deleted

## 2017-02-24 DIAGNOSIS — I10 Essential (primary) hypertension: Secondary | ICD-10-CM | POA: Insufficient documentation

## 2017-02-24 DIAGNOSIS — M1990 Unspecified osteoarthritis, unspecified site: Secondary | ICD-10-CM | POA: Diagnosis not present

## 2017-02-24 DIAGNOSIS — I480 Paroxysmal atrial fibrillation: Secondary | ICD-10-CM | POA: Insufficient documentation

## 2017-02-24 DIAGNOSIS — Z7901 Long term (current) use of anticoagulants: Secondary | ICD-10-CM | POA: Insufficient documentation

## 2017-02-24 DIAGNOSIS — E669 Obesity, unspecified: Secondary | ICD-10-CM | POA: Diagnosis not present

## 2017-02-24 DIAGNOSIS — K219 Gastro-esophageal reflux disease without esophagitis: Secondary | ICD-10-CM | POA: Diagnosis not present

## 2017-02-24 DIAGNOSIS — K642 Third degree hemorrhoids: Secondary | ICD-10-CM | POA: Insufficient documentation

## 2017-02-24 DIAGNOSIS — Z6835 Body mass index (BMI) 35.0-35.9, adult: Secondary | ICD-10-CM | POA: Diagnosis not present

## 2017-02-24 DIAGNOSIS — K643 Fourth degree hemorrhoids: Secondary | ICD-10-CM | POA: Insufficient documentation

## 2017-02-24 DIAGNOSIS — Z79899 Other long term (current) drug therapy: Secondary | ICD-10-CM | POA: Insufficient documentation

## 2017-02-24 HISTORY — PX: EVALUATION UNDER ANESTHESIA WITH HEMORRHOIDECTOMY: SHX5624

## 2017-02-24 SURGERY — EXAM UNDER ANESTHESIA WITH HEMORRHOIDECTOMY
Anesthesia: General | Site: Anus

## 2017-02-24 MED ORDER — KETAMINE HCL 10 MG/ML IJ SOLN
INTRAMUSCULAR | Status: DC | PRN
  Administered 2017-02-24: 10 mg via INTRAVENOUS

## 2017-02-24 MED ORDER — FENTANYL CITRATE (PF) 100 MCG/2ML IJ SOLN
INTRAMUSCULAR | Status: AC
  Filled 2017-02-24: qty 2

## 2017-02-24 MED ORDER — PROPOFOL 10 MG/ML IV BOLUS
INTRAVENOUS | Status: AC
  Filled 2017-02-24: qty 20

## 2017-02-24 MED ORDER — PROMETHAZINE HCL 25 MG/ML IJ SOLN: 6.2500 mg | INTRAMUSCULAR | Status: DC | PRN

## 2017-02-24 MED ORDER — KETAMINE HCL-SODIUM CHLORIDE 100-0.9 MG/10ML-% IV SOSY
PREFILLED_SYRINGE | INTRAVENOUS | Status: AC
  Filled 2017-02-24: qty 10

## 2017-02-24 MED ORDER — BUPIVACAINE-EPINEPHRINE 0.25% -1:200000 IJ SOLN
INTRAMUSCULAR | Status: DC | PRN
  Administered 2017-02-24: 30 mL

## 2017-02-24 MED ORDER — PROPOFOL 500 MG/50ML IV EMUL
INTRAVENOUS | Status: DC | PRN
  Administered 2017-02-24: 75 ug via INTRAVENOUS
  Administered 2017-02-24: 100 ug via INTRAVENOUS

## 2017-02-24 MED ORDER — ACETAMINOPHEN 500 MG PO TABS
1000.0000 mg | ORAL_TABLET | ORAL | Status: AC
  Administered 2017-02-24: 1000 mg via ORAL
  Filled 2017-02-24: qty 2

## 2017-02-24 MED ORDER — MEPERIDINE HCL 50 MG/ML IJ SOLN: 6.2500 mg | INTRAMUSCULAR | Status: DC | PRN

## 2017-02-24 MED ORDER — LIDOCAINE 2% (20 MG/ML) 5 ML SYRINGE
INTRAMUSCULAR | Status: AC
  Filled 2017-02-24: qty 5

## 2017-02-24 MED ORDER — GABAPENTIN 300 MG PO CAPS
300.0000 mg | ORAL_CAPSULE | ORAL | Status: AC
  Administered 2017-02-24: 300 mg via ORAL
  Filled 2017-02-24: qty 1

## 2017-02-24 MED ORDER — ROCURONIUM BROMIDE 100 MG/10ML IV SOLN
INTRAVENOUS | Status: DC | PRN
Start: 2017-02-24 — End: 2017-02-24
  Administered 2017-02-24: 40 mg via INTRAVENOUS

## 2017-02-24 MED ORDER — CHLORHEXIDINE GLUCONATE CLOTH 2 % EX PADS: 6.0000 | MEDICATED_PAD | Freq: Once | CUTANEOUS | Status: DC

## 2017-02-24 MED ORDER — OXYCODONE HCL 5 MG/5ML PO SOLN: 5.0000 mg | Freq: Once | ORAL | Status: DC | PRN

## 2017-02-24 MED ORDER — DIBUCAINE 1 % RE OINT
TOPICAL_OINTMENT | RECTAL | Status: AC
  Filled 2017-02-24: qty 28

## 2017-02-24 MED ORDER — DEXAMETHASONE SODIUM PHOSPHATE 10 MG/ML IJ SOLN
INTRAMUSCULAR | Status: AC
  Filled 2017-02-24: qty 1

## 2017-02-24 MED ORDER — SUGAMMADEX SODIUM 500 MG/5ML IV SOLN
INTRAVENOUS | Status: DC | PRN
  Administered 2017-02-24: 250 mg via INTRAVENOUS

## 2017-02-24 MED ORDER — ONDANSETRON HCL 4 MG/2ML IJ SOLN
INTRAMUSCULAR | Status: DC | PRN
  Administered 2017-02-24: 4 mg via INTRAVENOUS

## 2017-02-24 MED ORDER — LACTATED RINGERS IV SOLN
INTRAVENOUS | Status: DC
  Administered 2017-02-24 (×2): via INTRAVENOUS

## 2017-02-24 MED ORDER — ROCURONIUM BROMIDE 50 MG/5ML IV SOSY
PREFILLED_SYRINGE | INTRAVENOUS | Status: AC
  Filled 2017-02-24: qty 5

## 2017-02-24 MED ORDER — OXYCODONE HCL 5 MG PO TABS: 5.0000 mg | ORAL_TABLET | Freq: Once | ORAL | Status: DC | PRN

## 2017-02-24 MED ORDER — LIDOCAINE HCL (CARDIAC) 20 MG/ML IV SOLN
INTRAVENOUS | Status: DC | PRN
  Administered 2017-02-24: 25 mg via INTRATRACHEAL
  Administered 2017-02-24: 75 mg via INTRAVENOUS

## 2017-02-24 MED ORDER — BUPIVACAINE LIPOSOME 1.3 % IJ SUSP
20.0000 mL | INTRAMUSCULAR | Status: DC
  Filled 2017-02-24: qty 20

## 2017-02-24 MED ORDER — DIBUCAINE 1 % RE OINT
TOPICAL_OINTMENT | RECTAL | Status: DC | PRN
  Administered 2017-02-24: 1 via RECTAL

## 2017-02-24 MED ORDER — MIDAZOLAM HCL 2 MG/2ML IJ SOLN
INTRAMUSCULAR | Status: AC
  Filled 2017-02-24: qty 2

## 2017-02-24 MED ORDER — SUGAMMADEX SODIUM 500 MG/5ML IV SOLN
INTRAVENOUS | Status: AC
  Filled 2017-02-24: qty 5

## 2017-02-24 MED ORDER — HYDROMORPHONE HCL 1 MG/ML IJ SOLN
INTRAMUSCULAR | Status: AC
  Filled 2017-02-24: qty 1

## 2017-02-24 MED ORDER — PROPOFOL 10 MG/ML IV BOLUS
INTRAVENOUS | Status: DC | PRN
  Administered 2017-02-24: 200 mg via INTRAVENOUS

## 2017-02-24 MED ORDER — METRONIDAZOLE IN NACL 5-0.79 MG/ML-% IV SOLN
500.0000 mg | INTRAVENOUS | Status: AC
  Administered 2017-02-24: 500 mg via INTRAVENOUS
  Filled 2017-02-24: qty 100

## 2017-02-24 MED ORDER — FENTANYL CITRATE (PF) 100 MCG/2ML IJ SOLN
INTRAMUSCULAR | Status: DC | PRN
  Administered 2017-02-24 (×2): 50 ug via INTRAVENOUS

## 2017-02-24 MED ORDER — PHENYLEPHRINE 40 MCG/ML (10ML) SYRINGE FOR IV PUSH (FOR BLOOD PRESSURE SUPPORT)
PREFILLED_SYRINGE | INTRAVENOUS | Status: AC
  Filled 2017-02-24: qty 10

## 2017-02-24 MED ORDER — PROPOFOL 500 MG/50ML IV EMUL
INTRAVENOUS | Status: DC | PRN
  Administered 2017-02-24: 100 ug/kg/min via INTRAVENOUS

## 2017-02-24 MED ORDER — OXYCODONE HCL 5 MG PO TABS: 5.0000 mg | ORAL_TABLET | ORAL | 0 refills | Status: DC | PRN

## 2017-02-24 MED ORDER — ONDANSETRON HCL 4 MG/2ML IJ SOLN
INTRAMUSCULAR | Status: AC
  Filled 2017-02-24: qty 2

## 2017-02-24 MED ORDER — BUPIVACAINE-EPINEPHRINE 0.25% -1:200000 IJ SOLN
INTRAMUSCULAR | Status: AC
  Filled 2017-02-24: qty 1

## 2017-02-24 MED ORDER — HYDROMORPHONE HCL 1 MG/ML IJ SOLN
0.2500 mg | INTRAMUSCULAR | Status: DC | PRN
  Administered 2017-02-24: 0.5 mg via INTRAVENOUS

## 2017-02-24 MED ORDER — LIDOCAINE 2% (20 MG/ML) 5 ML SYRINGE
INTRAMUSCULAR | Status: DC | PRN
  Administered 2017-02-24 (×2): 1 mg/kg/h via INTRAVENOUS

## 2017-02-24 MED ORDER — CEFAZOLIN SODIUM-DEXTROSE 2-4 GM/100ML-% IV SOLN
2.0000 g | INTRAVENOUS | Status: AC
  Administered 2017-02-24: 2 g via INTRAVENOUS
  Filled 2017-02-24: qty 100

## 2017-02-24 MED ORDER — 0.9 % SODIUM CHLORIDE (POUR BTL) OPTIME
TOPICAL | Status: DC | PRN
  Administered 2017-02-24: 1000 mL

## 2017-02-24 MED ORDER — BUPIVACAINE LIPOSOME 1.3 % IJ SUSP
INTRAMUSCULAR | Status: DC | PRN
  Administered 2017-02-24: 20 mL

## 2017-02-24 MED ORDER — MIDAZOLAM HCL 5 MG/5ML IJ SOLN
INTRAMUSCULAR | Status: DC | PRN
  Administered 2017-02-24: 0.5 mg via INTRAVENOUS

## 2017-02-24 MED ORDER — DEXAMETHASONE SODIUM PHOSPHATE 10 MG/ML IJ SOLN
INTRAMUSCULAR | Status: DC | PRN
  Administered 2017-02-24: 10 mg via INTRAVENOUS

## 2017-02-24 MED ORDER — PHENYLEPHRINE HCL 10 MG/ML IJ SOLN
INTRAMUSCULAR | Status: DC | PRN
  Administered 2017-02-24 (×2): 80 ug via INTRAVENOUS

## 2017-02-24 SURGICAL SUPPLY — 33 items
APL SKNCLS STERI-STRIP NONHPOA (GAUZE/BANDAGES/DRESSINGS) ×2
BENZOIN TINCTURE PRP APPL 2/3 (GAUZE/BANDAGES/DRESSINGS) ×3 IMPLANT
BLADE SURG 15 STRL LF DISP TIS (BLADE) ×2 IMPLANT
BLADE SURG 15 STRL SS (BLADE) ×3
BRIEF STRETCH FOR OB PAD LRG (UNDERPADS AND DIAPERS) ×3 IMPLANT
COVER SURGICAL LIGHT HANDLE (MISCELLANEOUS) ×3 IMPLANT
DECANTER SPIKE VIAL GLASS SM (MISCELLANEOUS) ×3 IMPLANT
DRAPE LAPAROTOMY T 102X78X121 (DRAPES) ×3 IMPLANT
DRSG PAD ABDOMINAL 8X10 ST (GAUZE/BANDAGES/DRESSINGS) IMPLANT
ELECT PENCIL ROCKER SW 15FT (MISCELLANEOUS) ×3 IMPLANT
ELECT REM PT RETURN 15FT ADLT (MISCELLANEOUS) ×3 IMPLANT
GAUZE SPONGE 4X4 12PLY STRL (GAUZE/BANDAGES/DRESSINGS) ×2 IMPLANT
GAUZE SPONGE 4X4 16PLY XRAY LF (GAUZE/BANDAGES/DRESSINGS) ×3 IMPLANT
GLOVE ECLIPSE 8.0 STRL XLNG CF (GLOVE) ×3 IMPLANT
GLOVE INDICATOR 8.0 STRL GRN (GLOVE) ×3 IMPLANT
GOWN STRL REUS W/TWL XL LVL3 (GOWN DISPOSABLE) ×6 IMPLANT
KIT BASIN OR (CUSTOM PROCEDURE TRAY) ×3 IMPLANT
KIT SLIDE ONE PROLAPS HEMORR (KITS) ×1 IMPLANT
LUBRICANT JELLY K Y 4OZ (MISCELLANEOUS) ×3 IMPLANT
NEEDLE HYPO 22GX1.5 SAFETY (NEEDLE) ×3 IMPLANT
PACK BASIC VI WITH GOWN DISP (CUSTOM PROCEDURE TRAY) ×3 IMPLANT
PAD ABD 7.5X8 STRL (GAUZE/BANDAGES/DRESSINGS) ×2 IMPLANT
SCRUB TECHNI CARE 4 OZ NO DYE (MISCELLANEOUS) ×3 IMPLANT
SHEARS HARMONIC 9CM CVD (BLADE) IMPLANT
SUT CHROMIC 2 0 SH (SUTURE) ×4 IMPLANT
SUT CHROMIC 3 0 SH 27 (SUTURE) IMPLANT
SUT VIC AB 2-0 SH 27 (SUTURE)
SUT VIC AB 2-0 SH 27X BRD (SUTURE) IMPLANT
SUT VIC AB 2-0 UR6 27 (SUTURE) ×12 IMPLANT
SYR 20CC LL (SYRINGE) ×3 IMPLANT
TOWEL OR 17X26 10 PK STRL BLUE (TOWEL DISPOSABLE) ×3 IMPLANT
TOWEL OR NON WOVEN STRL DISP B (DISPOSABLE) ×3 IMPLANT
YANKAUER SUCT BULB TIP 10FT TU (MISCELLANEOUS) ×3 IMPLANT

## 2017-02-24 NOTE — Progress Notes (Signed)
Contacted Dr Johney Maine to clarify when pt is to restart Eliquis; Instructed for pt to begin tomorrow 10/26. Pt and his wife verbalized understanding.

## 2017-02-24 NOTE — H&P (Signed)
[name]  Location: [facility] Patient #: [id number] DOB: 02/23/1951 Married / Language: Cleophus Molt / Race: White Male  Patient Care Team: Lajean Manes, MD as PCP - General (Internal Medicine) Michael Boston, MD as Consulting Physician (General Surgery) Clarene Essex, MD as Consulting Physician (Gastroenterology) Fay Records, MD as Consulting Physician (Cardiology) Sueanne Margarita, MD as Consulting Physician (Sleep Medicine)   History of Present Illness Adin Hector MD; 12/28/2016 9:54 AM) The patient is a 66 year old male who presents with hemorrhoids. Note for "Hemorrhoids": ` ` ` Patient sent for surgical consultation at the request of Dr. 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 Takes intermittent flecainide. Is fully anticoagulated on Eliquis. Followed by cardiology. He's had hemorrhoid issues for many years. Stays on Metamucil. Usually moves his bowels a couple times in the morning. Had recent colonoscopy that showed a few small polyps that were removed. A little up in 5 years. However the patient's had worsening problems with his hemorrhoids. More episodes of bleeding. It is intermittently popping out in some areas and out all the time and others. Causes a lot of discomfort for most of all the morning. He started wear pads and other things but still struggles. Wished to have something more aggressive done. Surgical consultation requested.  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 He's not had any anorectal interventions. No abdominal surgery. Usually does not have her bowels. He can walk a half hour without much difficulty.  (Review of systems as stated in this history (HPI) or in the review of systems. Otherwise all other 12 point ROS are negative)   Past Surgical History (Tanisha A. Owens Shark, [place]; 12/28/2016 8:58 AM) Knee Surgery   Left. Oral Surgery  Tonsillectomy   Diagnostic Studies History (Tanisha A. Owens Shark, [place]; 12/28/2016 8:58 AM) Colonoscopy  within last year  Allergies (Tanisha A. Owens Shark, [place]; 12/28/2016 9:00 AM) No Known Drug Allergies 12/28/2016 Allergies Reconciled   Medication History (Tanisha A. Owens Shark, [place]; 12/28/2016 9:02 AM) AmLODIPine Besylate (10MG  Tablet, Oral) Active. Eliquis (5MG  Tablet, Oral) Active. Flecainide Acetate (50MG  Tablet, Oral) Active. HydroCHLOROthiazide (12.5MG  Capsule, Oral) Active. Metoprolol Succinate ER (100MG  Tablet ER 24HR, Oral) Active. Vitamin B-12 (1000MCG Tablet, Oral) Active. Doxycycline Hyclate (100MG  Tablet, Oral) Active. Medications Reconciled  Social History (Tanisha A. Owens Shark, [place]; 12/28/2016 8:59 AM) Alcohol use  Moderate alcohol use. Caffeine use  Coffee. No drug use  Tobacco use  Never smoker.  Family History (Tanisha A. Owens Shark, [place]; 12/28/2016 8:58 AM) Arthritis  Father. Cerebrovascular Accident  Father. Heart disease in male family member before age 28  Heart disease in male family member before age 35  Hypertension  Sister. Migraine Headache  Daughter. Prostate Cancer  Father.  Other Problems (Tanisha A. Owens Shark, [place]; 12/28/2016 8:59 AM) Arthritis  Atrial Fibrillation  Gastroesophageal Reflux Disease  Hemorrhoids  High blood pressure   BP (!) 141/79   Pulse (!) 54   Temp 98 F (36.7 C) (Oral)   Resp 18   Ht 5\' 10"  (1.778 m)   Wt 114.4 kg (252 lb 4.8 oz)   SpO2 100%   BMI 36.20 kg/m     Review of Systems (Tanisha A. Brown RMA; 12/28/2016 8:59 AM) General Present- Night Sweats. Not Present- Appetite Loss, Chills, Fatigue, Fever, Weight Gain and Weight Loss. Skin Present- Rash. Not Present- Change in Wart/Mole, Dryness, Hives, Jaundice, New Lesions, Non-Healing Wounds and Ulcer.  HEENT Present- Wears glasses/contact lenses. Not Present- Earache, Hearing Loss, Hoarseness, Nose Bleed, Oral Ulcers, Ringing in the  Ears, Seasonal Allergies, Sinus Pain, Sore Throat, Visual Disturbances and Yellow Eyes. Respiratory Present- Snoring. Not Present- Bloody sputum, Chronic Cough, Difficulty Breathing and Wheezing. Breast Not Present- Breast Mass, Breast Pain, Nipple Discharge and Skin Changes. Cardiovascular Present- Palpitations. Not Present- Chest Pain, Difficulty Breathing Lying Down, Leg Cramps, Rapid Heart Rate, Shortness of Breath and Swelling of Extremities. Gastrointestinal Present- Change in Bowel Habits and Hemorrhoids. Not Present- Abdominal Pain, Bloating, Bloody Stool, Chronic diarrhea, Constipation, Difficulty Swallowing, Excessive gas, Gets full quickly at meals, Indigestion, Nausea, Rectal Pain and Vomiting. Male Genitourinary Not Present- Blood in Urine, Change in Urinary Stream, Frequency, Impotence, Nocturia, Painful Urination, Urgency and Urine Leakage. Musculoskeletal Present- Joint Stiffness and Swelling of Extremities. Not Present- Back Pain, Joint Pain, Muscle Pain and Muscle Weakness. Neurological Present- Numbness and Tingling. Not Present- Decreased Memory, Fainting, Headaches, Seizures, Tremor, Trouble walking and Weakness. Psychiatric Not Present- Anxiety, Bipolar, Change in Sleep Pattern, Depression, Fearful and Frequent crying. Endocrine Not Present- Cold Intolerance, Excessive Hunger, Hair Changes, Heat Intolerance, Hot flashes and New Diabetes. Hematology Present- Blood Thinners. Not Present- Easy Bruising, Excessive bleeding, Gland problems, HIV and Persistent Infections.  Vitals (Tanisha A. Brown RMA; 12/28/2016 9:00 AM) 12/28/2016 8:59 AM Weight: 249.6 lb Height: 70in Body Surface Area: 2.29 m Body Mass Index: 35.81 kg/m  Temp.: 97.47F  Pulse: 64 (Regular)  P.OX: 97% (Room air) BP: 126/72 (Sitting, Left Arm, Standard)    BP (!) 141/79   Pulse (!) 54   Temp 98 F (36.7 C) (Oral)   Resp 18   Ht 5\' 10"  (1.778 m)   Wt 114.4 kg (252 lb 4.8 oz)   SpO2 100%   BMI  36.20 kg/m    Physical Exam  General Mental Status-Alert. General Appearance-Not in acute distress, Not Sickly. Orientation-Oriented X3. Hydration-Well hydrated. Voice-Normal.  Integumentary Global Assessment Upon inspection and palpation of skin surfaces of the - Axillae: non-tender, no inflammation or ulceration, no drainage. and Distribution of scalp and body hair is normal. General Characteristics Temperature - normal warmth is noted.  Head and Neck Head-normocephalic, atraumatic with no lesions or palpable masses. Face Global Assessment - atraumatic, no absence of expression. Neck Global Assessment - no abnormal movements, no bruit auscultated on the right, no bruit auscultated on the left, no decreased range of motion, non-tender. Trachea-midline. Thyroid Gland Characteristics - non-tender.  Eye Eyeball - Left-Extraocular movements intact, No Nystagmus. Eyeball - Right-Extraocular movements intact, No Nystagmus. Cornea - Left-No Hazy. Cornea - Right-No Hazy. Sclera/Conjunctiva - Left-No scleral icterus, No Discharge. Sclera/Conjunctiva - Right-No scleral icterus, No Discharge. Pupil - Left-Direct reaction to light normal. Pupil - Right-Direct reaction to light normal.  ENMT Ears Pinna - Left - no drainage observed, no generalized tenderness observed. Right - no drainage observed, no generalized tenderness observed. Nose and Sinuses External Inspection of the Nose - no destructive lesion observed. Inspection of the nares - Left - quiet respiration. Right - quiet respiration. Mouth and Throat Lips - Upper Lip - no fissures observed, no pallor noted. Lower Lip - no fissures observed, no pallor noted. Nasopharynx - no discharge present. Oral Cavity/Oropharynx - Tongue - no dryness observed. Oral Mucosa - no cyanosis observed. Hypopharynx - no evidence of airway distress observed.  Chest and Lung Exam Inspection Movements - Normal and  Symmetrical. Accessory muscles - No use of accessory muscles in breathing. Palpation Palpation of the chest reveals -  Non-tender. Auscultation Breath sounds - Normal and Clear.  Cardiovascular Auscultation Rhythm - Regular. Murmurs & Other Heart Sounds - Auscultation of the heart reveals - No Murmurs and No Systolic Clicks.  Abdomen Inspection Inspection of the abdomen reveals - No Visible peristalsis and No Abnormal pulsations. Umbilicus - No Bleeding, No Urine drainage. Palpation/Percussion Palpation and Percussion of the abdomen reveal - Soft, Non Tender, No Rebound tenderness, No Rigidity (guarding) and No Cutaneous hyperesthesia. Note: Abdomen obese but soft. Nontender. Not distended. Moderate diastases recti. Small periumbilical bulging consistent with chronically incarcerated umbilical hernia. Asymptomatic. No guarding.   Male Genitourinary Sexual Maturity Tanner 5 - Adult hair pattern and Adult penile size and shape.  Rectal Note: Please to refer to anoscopy section. Grade 3 and 4 hemorrhoids, especially left-sided.   Peripheral Vascular Upper Extremity Inspection - Left - No Cyanotic nailbeds, Not Ischemic. Right - No Cyanotic nailbeds, Not Ischemic.  Neurologic Neurologic evaluation reveals -normal attention span and ability to concentrate, able to name objects and repeat phrases. Appropriate fund of knowledge , normal sensation and normal coordination. Mental Status Affect - not angry, not paranoid. Cranial Nerves-Normal Bilaterally. Gait-Normal.  Neuropsychiatric Mental status exam performed with findings of-able to articulate well with normal speech/language, rate, volume and coherence, thought content normal with ability to perform basic computations and apply abstract reasoning and no evidence of hallucinations, delusions, obsessions or homicidal/suicidal ideation.  Musculoskeletal Global Assessment Spine, Ribs and Pelvis - no instability,  subluxation or laxity. Right Upper Extremity - no instability, subluxation or laxity.  Lymphatic Head & Neck  General Head & Neck Lymphatics: Bilateral - Description - No Localized lymphadenopathy. Axillary  General Axillary Region: Bilateral - Description - No Localized lymphadenopathy. Femoral & Inguinal  Generalized Femoral & Inguinal Lymphatics: Left - Description - No Localized lymphadenopathy. Right - Description - No Localized lymphadenopathy.   Results Adin Hector MD; 12/28/2016 9:48 AM) Procedures  Name Value Date Hemorrhoids Procedure Internal exam: Internal Hemorroids ( non-bleeding) prolapse Other: Large left lateral hemorrhoid chronically prolapsed grade 4. Internal hemorrhoid pedunculated intermittently prolapsing as well. Grade 3 Rather inflamed and irritated. Friable. Grade 2 right posterior internal hemorrhoid Perianal skin clean with good hygiene. No pruritis ani. No pilonidal disease. No fissure. No abscess/fistula. Normal sphincter tone.  No condyloma warts. Tolerates digital and anoscopic rectal exam. No other rectal masses.  Performed: 12/28/2016 9:36 AM    Assessment & Plan  PROLAPSED INTERNAL HEMORRHOIDS, GRADE 4 (K64.3) PROLAPSED INTERNAL HEMORRHOIDS, GRADE 3 (K64.2) INTERNAL BLEEDING HEMORRHOIDS (K64.8) Impression: Chronically prolapsed and prolapsing hemorrhoids with friability and bleeding in patient requires full anticoagulation for his intermittent atrial fibrillation. Worsening pain symptoms and discomfort.  I think he would benefit from treatment to remove the hemorrhoids. Because they are prolapsed intermittently prolapsing, I do not think he is amenable to banding. That usually does not control bleeding well anyway. I think this would require surgical hemorrhoidal ligation with pexy of all piles and resection of the obvious prolapsed material.  Usually this is an outpatient surgery.  He is fully anticoagulated. Would  hold his Eliquis 2 days prior to surgery as long as cardiology agrees.  He has good exercise tolerance. His A. fib is only intermittent. Don't know if they want his flecainide more regularly or not.  The anatomy & physiology of the anorectal region was discussed.  The pathophysiology of hemorrhoids and differential diagnosis was discussed.  Natural history risks without surgery was discussed.   I stressed the importance of a bowel regimen to  have daily soft bowel movements to minimize progression of disease.  Interventions such as sclerotherapy & banding were discussed.  The patient's symptoms are not adequately controlled by medicines and other non-operative treatments.  I feel the risks & problems of no surgery outweigh the operative risks; therefore, I recommended surgery to treat the hemorrhoids by ligation, pexy, and possible resection.  Risks such as bleeding, infection, urinary difficulties, injury to other organs, need for repair of tissues / organs, need for further treatment, heart attack, death, and other risks were discussed.   I noted a good likelihood this will help address the problem.  Goals of post-operative recovery were discussed as well.  Possibility that this will not correct all symptoms was explained.  Post-operative pain, bleeding, constipation, and other problems after surgery were discussed.  We will work to minimize complications.   Educational handouts further explaining the pathology, treatment options, and bowel regimen were given as well.  Questions were answered.  The patient expresses understanding & wishes to proceed with surgery.      ENCOUNTER FOR PREOPERATIVE EXAMINATION FOR GENERAL SURGICAL PROCEDURE (Z01.818) Current Plans You are being scheduled for surgery- Our schedulers will call you.  You should hear from our office's scheduling department within 5 working days about the location, date, and time of surgery. We try to make accommodations for patient's  preferences in scheduling surgery, but sometimes the OR schedule or the surgeon's schedule prevents [place] from making those accommodations.  If you have not heard from our office [phone]) in 5 working days, call the office and ask for your surgeon's nurse.  If you have other questions about your diagnosis, plan, or surgery, call the office and ask for your surgeon's nurse.  Pt Education - [facility] Rectal Prep for Anorectal outpatient/office surgery: discussed with patient and provided information. Pt Education - [facility] Rectal Surgery HCI (Boen Sterbenz): discussed with patient and provided information. Pt Education - [place] ([place]ah M[place]) ANTICOAGULATED (Z79.01) Impression: He is fully anticoagulated for his intermittent paroxysmal atrial fibrillation.  Held Eliquis 48 hours prior to surgery and then restarted on postop day 1.  Cardiology is planning ablation in early December  INCARCERATED UMBILICAL HERNIA (Q75.9) Impression: Small umbilical hernia incarcerated with moderate diastases recti  It is small and asymptomatic.. I would leave it alone. DIASTASIS RECTI (M62.08) Current Plans Pt Education - [facility] Diastasis Recti: discussed with patient and provided information.  Adin Hector, M.D., F.A.C.S. Gastrointestinal and Minimally Invasive Surgery [facility] [address] [place],  [address] [phone] Main / Paging

## 2017-02-24 NOTE — Anesthesia Procedure Notes (Signed)
Procedure Name: Intubation Date/Time: 02/24/2017 12:51 PM Performed by: Lissa Morales Pre-anesthesia Checklist: Patient identified, Emergency Drugs available, Suction available and Patient being monitored Patient Re-evaluated:Patient Re-evaluated prior to induction Oxygen Delivery Method: Circle system utilized Preoxygenation: Pre-oxygenation with 100% oxygen Induction Type: IV induction Ventilation: Mask ventilation without difficulty Laryngoscope Size: Mac and 4 Grade View: Grade III Tube type: Oral Tube size: 7.5 mm Number of attempts: 1 Airway Equipment and Method: Stylet and Oral airway Placement Confirmation: ETT inserted through vocal cords under direct vision,  positive ETCO2 and breath sounds checked- equal and bilateral Secured at: 21 cm Tube secured with: Tape Dental Injury: Teeth and Oropharynx as per pre-operative assessment  Difficulty Due To: Difficulty was anticipated, Difficult Airway- due to large tongue and Difficult Airway- due to anterior larynx

## 2017-02-24 NOTE — Transfer of Care (Signed)
Immediate Anesthesia Transfer of Care Note  Patient: Travis [name]  Procedure(s) Performed: [name] UNDER ANESTHESIA WITH  HEMORRHOIDECTOMY (N/A Anus)  Patient Location: PACU  Anesthesia Type:General  Level of Consciousness: awake, alert , oriented and patient cooperative  Airway & Oxygen Therapy: Patient Spontanous Breathing and Patient connected to face mask oxygen  Post-op Assessment: Report given to RN, Post -op Vital signs reviewed and stable and Patient moving all extremities X 4  Post vital signs: stable  Last Vitals:  Vitals:   02/24/17 1037  BP: (!) 141/79  Pulse: (!) 54  Resp: 18  Temp: 36.7 C  SpO2: 100%    Last Pain:  Vitals:   02/24/17 1037  TempSrc: Oral      Patients Stated Pain Goal: 5 (25/95/63 [id number])  Complications: No apparent anesthesia complications

## 2017-02-24 NOTE — Discharge Instructions (Signed)
General Anesthesia, Adult, Care After These instructions provide you with information about caring for yourself after your procedure. Your health care provider may also give you more specific instructions. Your treatment has been planned according to current medical practices, but problems sometimes occur. Call your health care provider if you have any problems or questions after your procedure. What can I expect after the procedure? After the procedure, it is common to have:  Vomiting.  A sore throat.  Mental slowness.  It is common to feel:  Nauseous.  Cold or shivery.  Sleepy.  Tired.  Sore or achy, even in parts of your body where you did not have surgery.  Follow these instructions at home: For at least 24 hours after the procedure:  Do not: ? Participate in activities where you could fall or become injured. ? Drive. ? Use heavy machinery. ? Drink alcohol. ? Take sleeping pills or medicines that cause drowsiness. ? Make important decisions or sign legal documents. ? Take care of children on your own.  Rest. Eating and drinking  If you vomit, drink water, juice, or soup when you can drink without vomiting.  Drink enough fluid to keep your urine clear or pale yellow.  Make sure you have little or no nausea before eating solid foods.  Follow the diet recommended by your health care provider. General instructions  Have a responsible adult stay with you until you are awake and alert.  Return to your normal activities as told by your health care provider. Ask your health care provider what activities are safe for you.  Take over-the-counter and prescription medicines only as told by your health care provider.  If you smoke, do not smoke without supervision.  Keep all follow-up visits as told by your health care provider. This is important. Contact a health care provider if:  You continue to have nausea or vomiting at home, and medicines are not helpful.  You  cannot drink fluids or start eating again.  You cannot urinate after 8-12 hours.  You develop a skin rash.  You have fever.  You have increasing redness at the site of your procedure. Get help right away if:  You have difficulty breathing.  You have chest pain.  You have unexpected bleeding.  You feel that you are having a life-threatening or urgent problem. This information is not intended to replace advice given to you by your health care provider. Make sure you discuss any questions you have with your health care provider. Document Released: 07/26/2000 Document Revised: 09/22/2015 Document Reviewed: 04/03/2015 Elsevier Interactive Patient Education  2018 [employer]. Surgical Procedures for Hemorrhoids, Care After Refer to this sheet in the next few weeks. These instructions provide you with information about caring for yourself after your procedure. Your health care provider may also give you more specific instructions. Your treatment has been planned according to current medical practices, but problems sometimes occur. Call your health care provider if you have any problems or questions after your procedure. What can I expect after the procedure? After the procedure, it is common to have:  Rectal pain.  Pain when you are having a bowel movement.  Slight rectal bleeding.  Follow these instructions at home: Medicines  Take over-the-counter and prescription medicines only as told by your health care provider.  Do not drive or operate heavy machinery while taking prescription pain medicine.  Use a stool softener or a bulk laxative as told by your health care provider. Activity  Rest at home.  Return to your normal activities as told by your health care provider.  Do not lift anything that is heavier than 10 lb (4.5 kg).  Do not sit for long periods of time. Take a walk every day or as told by your health care provider.  Do not strain to have a bowel movement. Do not  spend a long time sitting on the toilet. Eating and drinking  Eat foods that contain fiber, such as whole grains, beans, nuts, fruits, and vegetables.     (For first 24 hours following procedure eat a pureed/full liquid diet.)  Drink enough fluid to keep your urine clear or pale yellow. General instructions  Sit in a warm bath 2-3 times per day to relieve soreness or itching.  Keep all follow-up visits as told by your health care provider. This is important. Contact a health care provider if:  Your pain medicine is not helping.  You have a fever or chills.  You become constipated.  You have trouble passing urine. Get help right away if:  You have very bad rectal pain.  You have heavy bleeding from your rectum. This information is not intended to replace advice given to you by your health care provider. Make sure you discuss any questions you have with your health care provider. Document Released: 07/10/2003 Document Revised: 09/25/2015 Document Reviewed: 07/15/2014 Elsevier Interactive Patient Education  [employer].

## 2017-02-24 NOTE — Anesthesia Preprocedure Evaluation (Signed)
Anesthesia Evaluation    Airway Mallampati: II  TM Distance: >3 FB Neck ROM: Full    Dental  (+) Dental Advisory Given   Pulmonary    Pulmonary exam normal breath sounds clear to auscultation       Cardiovascular hypertension, Normal cardiovascular exam Rhythm:Regular Rate:Normal     Neuro/Psych    GI/Hepatic   Endo/Other    Renal/GU      Musculoskeletal   Abdominal (+) + obese,   Peds  Hematology   Anesthesia Other Findings   Reproductive/Obstetrics                            Anesthesia Physical Anesthesia Plan  ASA: II  Anesthesia Plan: General   Post-op Pain Management:    Induction: Intravenous  PONV Risk Score and Plan: 3 and Ondansetron, Dexamethasone, Midazolam and Treatment may vary due to age or medical condition  Airway Management Planned: Oral ETT  Additional Equipment:   Intra-op Plan:   Post-operative Plan: Extubation in OR  Informed Consent: I have reviewed the patients History and Physical, chart, labs and discussed the procedure including the risks, benefits and alternatives for the proposed anesthesia with the patient or authorized representative who has indicated his/her understanding and acceptance.   Dental advisory given  Plan Discussed with: CRNA  Anesthesia Plan Comments:         Anesthesia Quick Evaluation

## 2017-02-24 NOTE — Anesthesia Postprocedure Evaluation (Signed)
Anesthesia Post Note  Patient: Travis [name]  Procedure(s) Performed: [name] UNDER ANESTHESIA WITH  HEMORRHOIDECTOMY (N/A Anus)     Patient location during evaluation: PACU Anesthesia Type: General Level of consciousness: awake and alert Pain management: pain level controlled Vital Signs Assessment: post-procedure vital signs reviewed and stable Respiratory status: spontaneous breathing, nonlabored ventilation and respiratory function stable Cardiovascular status: blood pressure returned to baseline and stable Postop Assessment: no apparent nausea or vomiting Anesthetic complications: no    Last Vitals:  Vitals:   02/24/17 1500 02/24/17 1515  BP: 121/64 120/61  Pulse: (!) 56 (!) 58  Resp: 17 17  Temp:    SpO2: 99% 94%    Last Pain:  Vitals:   02/24/17 1515  TempSrc:   PainSc: Asleep                 [name].

## 2017-02-24 NOTE — Op Note (Signed)
02/24/2017  2:19 PM  PATIENT:  Travis [name]  66 y.o. male  Patient Care Team: Lajean Manes, MD as PCP - General (Internal Medicine) Michael Boston, MD as Consulting Physician (General Surgery) Clarene Essex, MD as Consulting Physician (Gastroenterology) Fay Records, MD as Consulting Physician (Cardiology) Sueanne Margarita, MD as Consulting Physician (Sleep Medicine)  PRE-OPERATIVE DIAGNOSIS:  Symptomatic hemorrhoids  POST-OPERATIVE DIAGNOSIS:  Symptomatic Grade 3 & 4 internal hemorrhoids  PROCEDURE:   Examination under anesthesia Removal of internal hemorrhoids x 2  Internal hemorrhoidal ligation and pexy  SURGEON:  Adin Hector, MD  ANESTHESIA:   General Anorectal & Local [name] block  0.25% bupivacaine with epinephrine at the beginning of the case. Liposomal bupivacaine (Experel) at the end of the case.  EBL:  Total I/O In: 1000 [I.V.:1000] Out: 75 [Blood:75].  See operative record  Delay start of Pharmacological VTE agent (>24hrs) due to surgical blood loss or risk of bleeding:  NO  DRAINS: NONE  SPECIMEN:  Source of Specimen:  Hemorrhoids  DISPOSITION OF SPECIMEN:  PATHOLOGY  COUNTS:  YES  PLAN OF CARE: Discharge home after PACU  PATIENT DISPOSITION:  PACU - hemodynamically stable.  INDICATION: Pleasant patient with struggles with hemorrhoids.  Not able to be managed in the office despite an improved bowel regimen.  I recommended examination under anesthesia and surgical treatment:  The anatomy & physiology of the anorectal region was discussed.  The pathophysiology of hemorrhoids and differential diagnosis was discussed.  Natural history risks without surgery was discussed.   I stressed the importance of a bowel regimen to have daily soft bowel movements to minimize progression of disease.  Interventions such as sclerotherapy & banding were discussed.  The patient's symptoms are not adequately controlled by medicines and other non-operative  treatments.  I feel the risks & problems of no surgery outweigh the operative risks; therefore, I recommended surgery to treat the hemorrhoids by ligation, pexy, and possible resection.  Risks such as bleeding, infection, need for further treatment, heart attack, death, and other risks were discussed.   I noted a good likelihood this will help address the problem.  Goals of post-operative recovery were discussed as well.  Possibility that this will not correct all symptoms was explained.  Post-operative pain, bleeding, constipation, urinary difficulties, and other problems after surgery were discussed.  We will work to minimize complications.   Educational handouts further explaining the pathology, treatment options, and bowel regimen were given as well.  Questions were answered.  The patient expresses understanding & wishes to proceed with surgery.  OR FINDINGS: Grade 4 left lateral and right anterior hemorrhoids - both excised.  Largest left lateral.  Right posterior grade 2/3  DESCRIPTION:   Informed consent was confirmed. Patient underwent general anesthesia without difficulty. Patient was placed into prone positioning.  The perianal region was prepped and draped in sterile fashion. Surgical time-out confirmed our plan.  I did digital rectal examination and then transitioned over to anoscopy to get a sense of the anatomy.  Findings noted above.   I did internal hemorrhoidal ligation and plexi in the classic exam agonal pattern (right anterior/lateral/posterior.  Left anterior/lateral/posterior.  I started each pexy suture with a 2-0 Vicryl suture on a UR-6 needle in a figure-of-eight fashion 6 cm proximal to the anal verge.  I then ran that stitch longitudinally more distally to close the hemorrhoidectomy wound to the anal verge over a large Hill-Furgeson retarctor to avoid narrowing of the anal canal.  I  then tied that stitch down to cause a hemorrhoidopexy.  In addition, I excised the hemorrhoids  longitudinally at the right posterior and left lateral locations. I excised the internal hemorrhoid longitudinally in a fusiform biconcave fashion, sparing rectal wall & anoderm to avoid narrowing.   I closed the external part of the wounds with 2-0 chromic suture, leaving the last 5 mm open to allow natural drainage.  I did that for each hemorrhoid pile.    I redid anoscopy examination.   At completion of this, all hemorrhoids had been removed or reduced into the rectum.  He did further ligation anterior midline is a well as left lateral colon more proximally to help ligate some redundant tissue.  After this, there was no prolapse.  External anatomy looked much more normal.  Hemostasis was good.  Fluffed gauze was on laid over the perianal region.  No packing done.  Patient is being extubated go to go to the recovery room.  I had discussed postop care in detail with the patient in the preop holding area.  Instructions for post-operative recovery and prescriptions are written. I discussed operative findings, updated the patient's status, discussed probable steps to recovery, and gave postoperative recommendations to the patient's spouse.  Recommendations were made.  Questions were answered.  She expressed understanding & appreciation.  Adin Hector, M.D., F.A.C.S. Gastrointestinal and Minimally Invasive Surgery [facility] [address] [place], [place] [address] [phone] Main / Paging

## 2017-02-24 NOTE — Interval H&P Note (Signed)
History and Physical Interval Note:  02/24/2017 12:01 PM  Travis [name]  has presented today for surgery, with the diagnosis of Symptomatic hemorrhoids  The various methods of treatment have been discussed with the patient and family. After consideration of risks, benefits and other options for treatment, the patient has consented to  Procedure(s): TRANSANAL HEMORRHOIDAL DEARTERIALIZATION LIGATION/PEXY ERAS PATHWAY (N/A) EXAM UNDER ANESTHESIA WITH POSSIBLE  HEMORRHOIDECTOMY (N/A) as a surgical intervention .  The patient's history has been reviewed, patient examined, no change in status, stable for surgery.  I have reviewed the patient's chart and labs.  Questions were answered to the patient's satisfaction.    I have re-reviewed the the patient's records, history, medications, and allergies.  I have re-examined the patient.  I again discussed intraoperative plans and goals of post-operative recovery.  The patient agrees to proceed.  Travis [name]  1951-04-15 [id number]  Patient Care Team: Lajean Manes, MD as PCP - General (Internal Medicine) Michael Boston, MD as Consulting Physician (General Surgery) Clarene Essex, MD as Consulting Physician (Gastroenterology) Fay Records, MD as Consulting Physician (Cardiology) Sueanne Margarita, MD as Consulting Physician (Sleep Medicine)  Patient Active Problem List   Diagnosis Date Noted  . OSA (obstructive sleep apnea) 07/26/2016  . Obesity (BMI 30-39.9) 07/26/2016  . Atrial fibrillation ([place])   . Hypertension   . Chest pain at rest     Past Medical History:  Diagnosis Date  . Anesthesia    Difficult to wake up   . Dysrhythmia    PVCs   . Hypertension   . Impaired glucose tolerance   . Obesity (BMI 30-39.9) 07/26/2016  . OSA (obstructive sleep apnea) 07/26/2016   Mild with AHI 12.5/hr now on CPAP at 14cm H2O  . Paroxysmal atrial fibrillation ([facility])   . Thrombophlebitis     Past Surgical History:  Procedure Laterality Date  .  FLEXIBLE SIGMOIDOSCOPY N/A 04/15/2015   Procedure:  UNSEDATED FLEXIBLE SIGMOIDOSCOPY;  Surgeon: [name], MD;  Location: [facility] END[facility]COPY;  Service: Endoscopy;  Laterality: N/A;  . TESTICLE SURGERY    . TONSILLECTOMY      Social History   Social History  . Marital status: Married    Spouse name: N/A  . Number of children: N/A  . Years of education: N/A   Occupational History  . Not on file.   Social History Main Topics  . Smoking status: Never Smoker  . Smokeless tobacco: Never Used  . Alcohol use Yes     Comment: daily; a couple of glasses of wine every night   . Drug use: No  . Sexual activity: Not on file   Other Topics Concern  . Not on file   Social History Narrative   Lives in [place] with spouse.  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   1 daughter    Family History  Problem Relation Age of Onset  . Dementia Mother   . Stroke Father   . Atrial fibrillation Father   . Hypertension Father   . Hypertension Paternal Grandfather   . Dementia Maternal Grandmother     Prescriptions Prior to Admission  Medication Sig Dispense Refill Last Dose  . amLODipine (NORVASC) 10 MG tablet Take 5 mg by mouth daily. Takes 0.5 tablet   02/24/2017 at 0800  . b complex vitamins tablet Take 1 tablet by mouth daily.   02/23/2017 at Unknown time  . celecoxib (CELEBREX) 200 MG capsule Take 200 mg by mouth daily as needed for  mild pain.   Past Month at Unknown time  . cromolyn (NASALCROM) 5.2 MG/ACT nasal spray Place 1 spray into both nostrils at bedtime.   02/23/2017 at Unknown time  . ELIQUIS 5 MG TABS tablet Take 1 tablet (5 mg total) by mouth 2 (two) times daily. 60 tablet 6 02/21/2017  . flecainide (TAMBOCOR) 50 MG tablet Take 1 tablet (50 mg total) by mouth 2 (two) times daily. 60 tablet 11 02/24/2017 at 0800  . hydrochlorothiazide (MICROZIDE) 12.5 MG capsule Take 12.5 mg by mouth daily.   02/24/2017 at 0800  . lisinopril (PRINIVIL,ZESTRIL) 30 MG tablet Take 15 mg by mouth  daily. Takes 0.5 tablet   02/23/2017 at Unknown time  . metoprolol succinate (TOPROL-XL) 100 MG 24 hr tablet TAKE 100 MG EVERY AM AND 50 MG EVERY PM (Patient taking differently: Take 50-100 mg by mouth See admin instructions. TAKE 100 MG EVERY AM AND 50 MG EVERY PM) 135 tablet 3 02/24/2017 at 0800  . psyllium (METAMUCIL) 58.6 % powder Take 1 packet by mouth every evening.    02/22/2017  . tadalafil (CIALIS) 20 MG tablet Take 20 mg by mouth daily as needed for erectile dysfunction.   More than a month at Unknown time    Current Facility-Administered Medications  Medication Dose Route Frequency Provider Last Rate Last Dose  . bupivacaine liposome (EXPAREL) 1.3 % injection 266 mg  20 mL Infiltration On Call to OR Michael Boston, MD      . ceFAZolin (ANCEF) IVPB 2g/100 mL premix  2 g Intravenous On Call to OR Michael Boston, MD       And  . metroNIDAZOLE (FLAGYL) IVPB 500 mg  500 mg Intravenous On Call to OR Michael Boston, MD      . Chlorhexidine Gluconate Cloth 2 % PADS 6 each  6 each Topical Once Michael Boston, MD      . lactated ringers infusion   Intravenous Continuous Josephine Igo, MD 75 mL/hr at 02/24/17 1051       No Known Allergies  BP (!) 141/79   Pulse (!) 54   Temp 98 F (36.7 C) (Oral)   Resp 18   Ht 5\' 10"  (1.778 m)   Wt 114.4 kg (252 lb 4.8 oz)   SpO2 100%   BMI 36.20 kg/m   Labs: No results found for this or any previous visit (from the past 48 hour(s)).  Imaging / Studies: Ct Maxillofacial Ltd Wo Cm  Result Date: 02/01/2017 CLINICAL DATA:  Initial evaluation for lightheadedness, sinus strain inch, cough. EXAM: CT PARANASAL SINUS LIMITED WITHOUT CONTRAST TECHNIQUE: Non-contiguous multidetector CT images of the paranasal sinuses were obtained in a single plane without contrast. COMPARISON:  Prior CT from 10/10/2016. FINDINGS: Limited coronal views of the paranasal sinuses were performed, bone and soft tissue window algorithm. Visualized frontal sinuses are clear. Mild  mucoperiosteal thickening within the ethmoidal air cells bilaterally without frank opacification. Visualize sphenoid sinuses are clear. Maxillary sinuses are clear bilaterally. Grossly patent ostiomeatal units. Nasal septum bowed to the right.  Nasal cavities grossly clear. No air-fluid level to suggest acute sinusitis. Visualized globes and orbital soft tissues within normal limits. Visualized soft tissues of the face unremarkable. Visualized intracranial contents unremarkable. IMPRESSION: Clear sinuses.  No CT evidence for acute or chronic sinusitis. Electronically Signed   By: Jeannine Boga M.D.   On: 02/01/2017 21:35     .Adin Hector, M.D., F.A.OliverS. Gastrointestinal and Minimally Invasive Surgery [facility] [address]  [address] [place], [place] [address] [phone] Main / Paging  02/24/2017 12:01 PM    Travis Lynde C.

## 2017-03-04 ENCOUNTER — Other Ambulatory Visit: Payer: Self-pay | Admitting: *Deleted

## 2017-03-04 ENCOUNTER — Emergency Department (HOSPITAL_COMMUNITY)
Admission: EM | Admit: 2017-03-04 | Discharge: 2017-03-04 | Disposition: A | Discharge status: Home / Self Care | Payer: 59 | Attending: Emergency Medicine | Admitting: Emergency Medicine

## 2017-03-04 ENCOUNTER — Encounter (HOSPITAL_COMMUNITY): Payer: Self-pay | Admitting: Emergency Medicine

## 2017-03-04 DIAGNOSIS — K625 Hemorrhage of anus and rectum: Secondary | ICD-10-CM

## 2017-03-04 DIAGNOSIS — I480 Paroxysmal atrial fibrillation: Secondary | ICD-10-CM

## 2017-03-04 DIAGNOSIS — Z79899 Other long term (current) drug therapy: Secondary | ICD-10-CM | POA: Diagnosis not present

## 2017-03-04 DIAGNOSIS — I10 Essential (primary) hypertension: Secondary | ICD-10-CM | POA: Diagnosis not present

## 2017-03-04 LAB — COMPREHENSIVE METABOLIC PANEL
ALK PHOS: 66 U/L (ref 38–126)
ALT: 51 U/L (ref 17–63)
ANION GAP: 11 (ref 5–15)
AST: 46 U/L — ABNORMAL HIGH (ref 15–41)
Albumin: 3.8 g/dL (ref 3.5–5.0)
BUN: 19 mg/dL (ref 6–20)
CALCIUM: 8.9 mg/dL (ref 8.9–10.3)
CO2: 28 mmol/L (ref 22–32)
Chloride: 96 mmol/L — ABNORMAL LOW (ref 101–111)
Creatinine, Ser: 1.13 mg/dL (ref 0.61–1.24)
GFR calc non Af Amer: >60 mL/min (ref >60)
Glucose, Bld: 99 mg/dL (ref 65–99)
POTASSIUM: 3.7 mmol/L (ref 3.5–5.1)
SODIUM: 135 mmol/L (ref 135–145)
Total Bilirubin: 0.7 mg/dL (ref 0.3–1.2)
Total Protein: 7.1 g/dL (ref 6.5–8.1)

## 2017-03-04 LAB — CBC
HEMATOCRIT: 42.8 % (ref 39.0–52.0)
HEMOGLOBIN: 15.1 g/dL (ref 13.0–17.0)
MCH: 32.4 pg (ref 26.0–34.0)
MCHC: 35.3 g/dL (ref 30.0–36.0)
MCV: 91.8 fL (ref 78.0–100.0)
Platelets: 237 10*3/uL (ref 150–400)
RBC: 4.66 MIL/uL (ref 4.22–5.81)
RDW: 13.1 % (ref 11.5–15.5)
WBC: 10.5 10*3/uL (ref 4.0–10.5)

## 2017-03-04 LAB — TYPE AND SCREEN
ABO/RH(D): O POS
ANTIBODY SCREEN: NEGATIVE

## 2017-03-04 NOTE — Discharge Instructions (Signed)
It was my pleasure taking care of you today!  We spoke with general surgery, Dr. Excell Seltzer, who recommends continuing to keep the area clean, frequent dressing changes and ice packs.   Return to ER for chest pain, new or worsening symptoms, any additional concerns.

## 2017-03-04 NOTE — ED Notes (Signed)
ED Provider at bedside. 

## 2017-03-04 NOTE — ED Triage Notes (Signed)
Patient states that he had hemorrhoid surgery last Thursday and still having rectal bleeding and per Dr Johney Maine needed to come into ED. Patient reports that he stopped taking Eliquis prior to surgery and still hasnt resumed starting back. Patient also c/o "PVCs" with lightheadedness that started today.

## 2017-03-04 NOTE — ED Provider Notes (Signed)
[facility] DEPT Provider Note   CSN: [id number] Arrival date & time: 03/04/17  1455     History   Chief Complaint Chief Complaint  Patient presents with  . Rectal Bleeding  . Palpitations    HPI Travis [name] is a 66 y.o. male.  The history is provided by the patient and medical records. No language interpreter was used.   Travis [name] is a 66 y.o. male  with a PMH of paroxysmal a.fib, HTN,  who presents to the Emergency Department complaining of persistent rectal bleeding s/p hemorrhoidectomy by Dr. Johney Maine on October 25.  Seen by Dr. Dennie Fetters in clinic yesterday where he was told everything was okay. Patient states that he had been constipated for the last several days.  Yesterday (after general surgery appointment),  he had a large bowel movement with purple/red pea-sized clots along with bright red blood dripping on the floor.  This was around 1030 last night. He called the clinic this morning, where he was told to put an ice pack to the area. He has applied ice to the area all day and notes that bleeding has somewhat improved. He notes associated lightheadedness. No syncopal episodes, chest pain or shortness of breath. He usually is on Eliquis for a. Fib. He stopped this medication the Monday prior to surgery and has not restarted yet. No other anticoagulants or aspirin use.  Past Medical History:  Diagnosis Date  . Anesthesia    Difficult to wake up   . Dysrhythmia    PVCs   . Hypertension   . Impaired glucose tolerance   . Obesity (BMI 30-39.9) 07/26/2016  . OSA (obstructive sleep apnea) 07/26/2016   Mild with AHI 12.5/hr now on CPAP at 14cm H2O  . Paroxysmal atrial fibrillation (HCC)   . Thrombophlebitis     Patient Active Problem List   Diagnosis Date Noted  . Prolapsed internal hemorrhoids, grade 4, s/p ligation/pexy/hemorrhoidectomy x 2 02/24/2017 02/24/2017  . OSA (obstructive sleep apnea) 07/26/2016  . Obesity (BMI 30-39.9)  07/26/2016  . Atrial fibrillation ([place])   . Hypertension   . Chest pain at rest     Past Surgical History:  Procedure Laterality Date  . EVALUATION UNDER ANESTHESIA WITH HEMORRHOIDECTOMY N/A 02/24/2017   Procedure: EXAM UNDER ANESTHESIA WITH  HEMORRHOIDECTOMY;  Surgeon: Michael Boston, MD;  Location: [facility];  Service: General;  Laterality: N/A;  . FLEXIBLE SIGMOIDOSCOPY N/A 04/15/2015   Procedure:  UNSEDATED FLEXIBLE SIGMOIDOSCOPY;  Surgeon: [name], MD;  Location: [facility];  Service: Endoscopy;  Laterality: N/A;  . TESTICLE SURGERY    . TONSILLECTOMY         Home Medications    Prior to Admission medications   Medication Sig Start Date End Date Taking? Authorizing Provider  amLODipine (NORVASC) 10 MG tablet Take 5 mg by mouth daily. Takes 0.5 tablet   Yes [provider]  b complex vitamins tablet Take 1 tablet by mouth daily.   Yes [provider]  cromolyn (NASALCROM) 5.2 MG/ACT nasal spray Place 1 spray into both nostrils at bedtime.   Yes [provider]  flecainide (TAMBOCOR) 50 MG tablet Take 1 tablet (50 mg total) by mouth 2 (two) times daily. 01/06/17  Yes Fay Records, MD  hydrochlorothiazide (MICROZIDE) 12.5 MG capsule Take 12.5 mg by mouth daily.   Yes [provider]  lisinopril (PRINIVIL,ZESTRIL) 30 MG tablet Take 15 mg by mouth daily. Takes 0.5 tablet   Yes [provider]  metoprolol succinate (TOPROL-XL) 100 MG 24 hr tablet TAKE 100 MG EVERY AM AND 50 MG EVERY PM Patient taking differently: Take 50-100 mg by mouth See admin instructions. TAKE 100 MG EVERY AM AND 50 MG EVERY PM 02/03/17  Yes Fay Records, MD  oxyCODONE (OXY IR/ROXICODONE) 5 MG immediate release tablet Take 1-2 tablets (5-10 mg total) by mouth every 4 (four) hours as needed for moderate pain, severe pain or breakthrough pain. 02/24/17  Yes Michael Boston, MD  polyethylene glycol (MIRALAX / GLYCOLAX) packet Take 17 g by mouth daily as needed for  moderate constipation.   Yes [provider]  psyllium (METAMUCIL) 58.6 % powder Take 1 packet by mouth every evening.    Yes [provider]  celecoxib (CELEBREX) 200 MG capsule Take 200 mg by mouth daily as needed for mild pain.    [provider]  ELIQUIS 5 MG TABS tablet Take 1 tablet (5 mg total) by mouth 2 (two) times daily. 10/25/16   Sherran Needs, NP  tadalafil (CIALIS) 20 MG tablet Take 20 mg by mouth daily as needed for erectile dysfunction.    [provider]    Family History Family History  Problem Relation Age of Onset  . Dementia Mother   . Stroke Father   . Atrial fibrillation Father   . Hypertension Father   . Hypertension Paternal Grandfather   . Dementia Maternal Grandmother     Social History Social History  Substance Use Topics  . Smoking status: Never Smoker  . Smokeless tobacco: Never Used  . Alcohol use Yes     Comment: daily; a couple of glasses of wine every night      Allergies   Patient has no known allergies.   Review of Systems Review of Systems  Gastrointestinal: Positive for anal bleeding and constipation. Negative for abdominal pain, nausea and vomiting.  Neurological: Positive for light-headedness. Negative for dizziness, syncope, weakness and headaches.  All other systems reviewed and are negative.    Physical Exam Updated Vital Signs BP 137/79 (BP Location: Right Arm)   Pulse 61   Temp 98.1 F (36.7 C) (Oral)   Resp (!) 23   Ht 5\' 10"  (1.778 m)   Wt 111.1 kg (245 lb)   SpO2 93%   BMI 35.15 kg/m   Physical Exam  Constitutional: He is oriented to person, place, and time. He appears well-developed and well-nourished. No distress.  HENT:  Head: Normocephalic and atraumatic.  Cardiovascular: Normal rate, regular rhythm and normal heart sounds.   No murmur heard. Pulmonary/Chest: Effort normal and breath sounds normal. No respiratory distress.  Abdominal: Soft. He exhibits no distension.  There is no tenderness.  Genitourinary:  Genitourinary Comments: Moderate amount of brown-red dried blood to pad.  Musculoskeletal: He exhibits no edema.  Neurological: He is alert and oriented to person, place, and time.  Skin: Skin is warm and dry.  Nursing note and vitals reviewed.      ED Treatments / Results  Labs (all labs ordered are listed, but only abnormal results are displayed) Labs Reviewed  COMPREHENSIVE METABOLIC PANEL - Abnormal; Notable for the following:       Result Value   Chloride 96 (*)    AST 46 (*)    All other components within normal limits  CBC  POC OCCULT BLOOD, ED  TYPE AND SCREEN    EKG  EKG Interpretation  Date/Time:  Friday March 04 2017 15:10:52 EDT Ventricular Rate:  65 PR Interval:    QRS Duration: 104 QT Interval:  485 QTC Calculation: 505 R Axis:   85 Text Interpretation:  Sinus rhythm Borderline right axis deviation Prolonged QT interval No significant change since last tracing Confirmed by Orlie Dakin [phone]) on 03/04/2017 3:14:42 PM       Radiology No results found.  Procedures Procedures (including critical care time)  Medications Ordered in ED Medications - No data to display   Initial Impression / Assessment and Plan / ED Course  I have reviewed the triage vital signs and the nursing notes.  Pertinent labs & imaging results that were available during my care of the patient were reviewed by me and considered in my medical decision making (see chart for details).    Travis [name] is a 66 y.o. male who presents to ED for persistent rectal bleeding s/p hemorrhoidectomy by Dr. Johney Maine on 10/25. Hemodynamically stable. Orthostatics reassuring. Hgb wdl at 15.1. Discussed case with general surgery, Dr. Excell Seltzer, who states that with normal hgb and improving but persistent slow bleeding, will need frequent dressing changes and cool packs to the area. Plan of care discussed with patient who understands and agrees with  plan. Understands reasons to return to ER. All questions answered.   Patient discussed with Dr. Ellender Hose who agrees with treatment plan.   Final Clinical Impressions(s) / ED Diagnoses   Final diagnoses:  Rectal bleeding    New Prescriptions New Prescriptions   No medications on file     Parlee Amescua, Ozella Almond, PA-C 03/04/17 1934    Duffy Bruce, MD 03/06/17 [phone]

## 2017-03-22 ENCOUNTER — Telehealth: Payer: Self-pay

## 2017-03-22 DIAGNOSIS — I480 Paroxysmal atrial fibrillation: Secondary | ICD-10-CM

## 2017-03-22 NOTE — Telephone Encounter (Signed)
Please arrive at [facility] at 5:30 am on 12/4. Do not eat or drink after midnight the night prior to the procedure Do not take any medications the morning of the test Plan for one night stay Will need someone to drive you home at discharge  Please arrive at the [facility] main entrance of [facility] at 1:45 pm (30-45 minutes prior to test start time)  [facility] [address] [address], [place] [address] [phone]  Proceed to the [facility] Radiology Department (First Floor).  Please follow these instructions carefully (unless otherwise directed):  Hold all erectile dysfunction medications at least 48 hours prior to test.  On the Night Before the Test: . Drink plenty of water. . Do not consume any caffeinated/decaffeinated beverages or chocolate 12 hours prior to your test. . Do not take any antihistamines 12 hours prior to your test. . If you take Metformin do not take 24 hours prior to test.  On the Day of the Test: . Drink plenty of water. Do not drink any water within one hour of the test. . Do not eat any food 4 hours prior to the test. . You may take your regular medications prior to the test.   After the Test: . Drink plenty of water. . After receiving IV contrast, you may experience a mild flushed feeling. This is normal. . On occasion, you may experience a mild rash up to 24 hours after the test. This is not dangerous. If this occurs, you can take Benadryl 25 mg and increase your fluid intake. . If you experience trouble breathing, this can be serious. If it is severe call 911 IMMEDIATELY. If it is mild, please call our office. . If you take any of these medications: Glipizide/Metformin, Avandament, Glucavance, please do not take 48 hours after completing test.

## 2017-03-28 ENCOUNTER — Telehealth: Payer: Self-pay | Admitting: Internal Medicine

## 2017-03-28 ENCOUNTER — Other Ambulatory Visit: Payer: 59 | Admitting: *Deleted

## 2017-03-28 DIAGNOSIS — I480 Paroxysmal atrial fibrillation: Secondary | ICD-10-CM

## 2017-03-28 NOTE — Telephone Encounter (Signed)
New message    Pt is asking for a call back. He has questions about his upcoming procedure and a procedure he just recently had. He said he is healing slowly from his last procedure. Please call.

## 2017-03-29 LAB — CBC WITH DIFFERENTIAL/PLATELET
BASOS: 0 %
Basophils Absolute: 0 10*3/uL (ref 0.0–0.2)
EOS (ABSOLUTE): 0.2 10*3/uL (ref 0.0–0.4)
Eos: 1 %
Hematocrit: 41.6 % (ref 37.5–51.0)
Hemoglobin: 14.5 g/dL (ref 13.0–17.7)
Immature Grans (Abs): 0 10*3/uL (ref 0.0–0.1)
Immature Granulocytes: 0 %
LYMPHS ABS: 1.5 10*3/uL (ref 0.7–3.1)
Lymphs: 15 %
MCH: 32.4 pg (ref 26.6–33.0)
MCHC: 34.9 g/dL (ref 31.5–35.7)
MCV: 93 fL (ref 79–97)
MONOS ABS: 1 10*3/uL — AB (ref 0.1–0.9)
Monocytes: 10 %
NEUTROS ABS: 7.7 10*3/uL — AB (ref 1.4–7.0)
Neutrophils: 74 %
PLATELETS: 223 10*3/uL (ref 150–379)
RBC: 4.47 x10E6/uL (ref 4.14–5.80)
RDW: 13.6 % (ref 12.3–15.4)
WBC: 10.4 10*3/uL (ref 3.4–10.8)

## 2017-03-29 LAB — BASIC METABOLIC PANEL
BUN / CREAT RATIO: 15 (ref 10–24)
BUN: 16 mg/dL (ref 8–27)
CHLORIDE: 101 mmol/L (ref 96–106)
CO2: 25 mmol/L (ref 20–29)
Calcium: 9.3 mg/dL (ref 8.6–10.2)
Creatinine, Ser: 1.06 mg/dL (ref 0.76–1.27)
GFR calc non Af Amer: 73 mL/min/{1.73_m2} (ref >59)
GFR, EST AFRICAN AMERICAN: 85 mL/min/{1.73_m2} (ref >59)
Glucose: 110 mg/dL — ABNORMAL HIGH (ref 65–99)
POTASSIUM: 3.7 mmol/L (ref 3.5–5.2)
Sodium: 143 mmol/L (ref 134–144)

## 2017-03-29 NOTE — Telephone Encounter (Signed)
Left message for patient to return call and ask for triage to discuss his concerns. Let him know that we could get a message to Dr Rayann Heman about his concerns moving forward with his ablation and call him back with Dr Jackalyn Lombard recommendations.  I have let him know to ask for triage

## 2017-03-29 NOTE — Telephone Encounter (Signed)
Spoke with patient who states he had hemorrhoid surgery at the end of October and needs to reschedule his ablation to a later time or date due to bowel problems that occur in the morning. He would like a time after 12 noon on December 4 or a later date in December. Would like to get this done before the end of 2018. I advised that someone from our office will call him back later this week. He thanked me for my help.

## 2017-03-30 NOTE — Telephone Encounter (Signed)
Spoke with patient and arranged his procedure according to what would be more beneficial for the patient.. Patient verbalized appreciation for this.

## 2017-03-31 ENCOUNTER — Ambulatory Visit (HOSPITAL_COMMUNITY)
Admission: RE | Admit: 2017-03-31 | Discharge: 2017-03-31 | Disposition: A | Discharge status: Home / Self Care | Payer: 59 | Source: Ambulatory Visit | Attending: Internal Medicine | Admitting: Internal Medicine

## 2017-03-31 ENCOUNTER — Ambulatory Visit (HOSPITAL_COMMUNITY): Admission: RE | Admit: 2017-03-31 | Payer: 59 | Source: Ambulatory Visit

## 2017-03-31 DIAGNOSIS — I480 Paroxysmal atrial fibrillation: Secondary | ICD-10-CM

## 2017-03-31 DIAGNOSIS — I4891 Unspecified atrial fibrillation: Secondary | ICD-10-CM | POA: Diagnosis not present

## 2017-03-31 MED ORDER — NITROGLYCERIN 0.4 MG SL SUBL
0.4000 mg | SUBLINGUAL_TABLET | SUBLINGUAL | Status: DC | PRN
  Administered 2017-03-31: 0.4 mg via SUBLINGUAL
  Filled 2017-03-31: qty 25

## 2017-03-31 MED ORDER — NITROGLYCERIN 0.4 MG SL SUBL
SUBLINGUAL_TABLET | SUBLINGUAL | Status: AC
  Filled 2017-03-31: qty 1

## 2017-04-03 ENCOUNTER — Encounter: Payer: Self-pay | Admitting: Internal Medicine

## 2017-04-04 ENCOUNTER — Encounter (HOSPITAL_COMMUNITY): Payer: Self-pay | Admitting: Anesthesiology

## 2017-04-04 ENCOUNTER — Telehealth: Payer: Self-pay | Admitting: Internal Medicine

## 2017-04-04 NOTE — Telephone Encounter (Signed)
Patient scheduled for afib ablation tomorrow with Dr. Rayann Heman.  Will rout to Dr. Jackalyn Lombard nurse.

## 2017-04-04 NOTE — Telephone Encounter (Signed)
Spoke with patient and answered all his questions

## 2017-04-04 NOTE — Telephone Encounter (Signed)
New message   Pt verbalized that he is calling because he is requesting guidance on his procedure tomorrow

## 2017-04-04 NOTE — Anesthesia Preprocedure Evaluation (Addendum)
Anesthesia Evaluation  Patient identified by MRN, date of birth, ID band Patient awake    Reviewed: Allergy & Precautions, NPO status , Patient's Chart, lab work & pertinent test results  Airway Mallampati: II  TM Distance: >3 FB Neck ROM: Full    Dental  (+) Teeth Intact, Dental Advisory Given   Pulmonary sleep apnea and Continuous Positive Airway Pressure Ventilation ,    breath sounds clear to auscultation       Cardiovascular hypertension, Pt. on medications and Pt. on home beta blockers + dysrhythmias Atrial Fibrillation  Rhythm:Irregular Rate:Normal     Neuro/Psych negative neurological ROS  negative psych ROS   GI/Hepatic negative GI ROS, Neg liver ROS,   Endo/Other  negative endocrine ROS  Renal/GU negative Renal ROS     Musculoskeletal   Abdominal (+) + obese,   Peds  Hematology   Anesthesia Other Findings   Reproductive/Obstetrics                            Anesthesia Physical Anesthesia Plan  ASA: III  Anesthesia Plan: General   Post-op Pain Management:    Induction: Intravenous  PONV Risk Score and Plan: 2 and Ondansetron, Dexamethasone and Midazolam  Airway Management Planned: Oral ETT and Video Laryngoscope Planned  Additional Equipment:   Intra-op Plan:   Post-operative Plan:   Informed Consent: I have reviewed the patients History and Physical, chart, labs and discussed the procedure including the risks, benefits and alternatives for the proposed anesthesia with the patient or authorized representative who has indicated his/her understanding and acceptance.   Dental advisory given  Plan Discussed with: CRNA  Anesthesia Plan Comments: (Pt prefers GA due to breathing issues without CPAP)     Anesthesia Quick Evaluation

## 2017-04-05 ENCOUNTER — Ambulatory Visit (HOSPITAL_COMMUNITY)
Admission: RE | Admit: 2017-04-05 | Discharge: 2017-04-06 | Disposition: A | Discharge status: Home / Self Care | Payer: 59 | Source: Ambulatory Visit | Attending: Internal Medicine | Admitting: Internal Medicine

## 2017-04-05 ENCOUNTER — Ambulatory Visit (HOSPITAL_COMMUNITY): Payer: 59 | Admitting: Anesthesiology

## 2017-04-05 ENCOUNTER — Other Ambulatory Visit: Payer: Self-pay

## 2017-04-05 ENCOUNTER — Encounter (HOSPITAL_COMMUNITY)
Admission: RE | Disposition: A | Discharge status: Home / Self Care | Payer: Self-pay | Source: Ambulatory Visit | Attending: Internal Medicine

## 2017-04-05 ENCOUNTER — Encounter (HOSPITAL_COMMUNITY): Payer: Self-pay | Admitting: General Practice

## 2017-04-05 DIAGNOSIS — I10 Essential (primary) hypertension: Secondary | ICD-10-CM | POA: Insufficient documentation

## 2017-04-05 DIAGNOSIS — I480 Paroxysmal atrial fibrillation: Secondary | ICD-10-CM | POA: Diagnosis not present

## 2017-04-05 DIAGNOSIS — Z79899 Other long term (current) drug therapy: Secondary | ICD-10-CM | POA: Insufficient documentation

## 2017-04-05 DIAGNOSIS — Z7901 Long term (current) use of anticoagulants: Secondary | ICD-10-CM | POA: Insufficient documentation

## 2017-04-05 DIAGNOSIS — G4733 Obstructive sleep apnea (adult) (pediatric): Secondary | ICD-10-CM | POA: Insufficient documentation

## 2017-04-05 HISTORY — DX: Adverse effect of unspecified anesthetic, initial encounter: T41.45XA

## 2017-04-05 HISTORY — DX: Acute embolism and thrombosis of unspecified deep veins of unspecified lower extremity: I82.409

## 2017-04-05 HISTORY — DX: Unspecified osteoarthritis, unspecified site: M19.90

## 2017-04-05 HISTORY — DX: Pneumonia, unspecified organism: J18.9

## 2017-04-05 HISTORY — DX: Dependence on other enabling machines and devices: Z99.89

## 2017-04-05 HISTORY — DX: Obstructive sleep apnea (adult) (pediatric): G47.33

## 2017-04-05 HISTORY — DX: Other complications of anesthesia, initial encounter: T88.59XA

## 2017-04-05 HISTORY — PX: ATRIAL FIBRILLATION ABLATION: EP1191

## 2017-04-05 HISTORY — DX: Personal history of other diseases of the digestive system: Z87.19

## 2017-04-05 HISTORY — DX: Gastro-esophageal reflux disease without esophagitis: K21.9

## 2017-04-05 LAB — POCT ACTIVATED CLOTTING TIME
ACTIVATED CLOTTING TIME: 252 s
ACTIVATED CLOTTING TIME: 279 s
ACTIVATED CLOTTING TIME: 197 s
Activated Clotting Time: 318 seconds
Activated Clotting Time: 208 seconds

## 2017-04-05 SURGERY — ATRIAL FIBRILLATION ABLATION
Anesthesia: General

## 2017-04-05 MED ORDER — HYDROCODONE-ACETAMINOPHEN 5-325 MG PO TABS: 1.0000 | ORAL_TABLET | ORAL | Status: DC | PRN

## 2017-04-05 MED ORDER — PROPOFOL 10 MG/ML IV BOLUS
INTRAVENOUS | Status: DC | PRN
  Administered 2017-04-05: 170 mg via INTRAVENOUS

## 2017-04-05 MED ORDER — HYDROCHLOROTHIAZIDE 12.5 MG PO CAPS
12.5000 mg | ORAL_CAPSULE | Freq: Every day | ORAL | Status: DC
  Administered 2017-04-06: 09:00:00 12.5 mg via ORAL
  Filled 2017-04-05: qty 1

## 2017-04-05 MED ORDER — HEPARIN SODIUM (PORCINE) 1000 UNIT/ML IJ SOLN
INTRAMUSCULAR | Status: AC
  Filled 2017-04-05: qty 1

## 2017-04-05 MED ORDER — ROCURONIUM BROMIDE 100 MG/10ML IV SOLN
INTRAVENOUS | Status: DC | PRN
  Administered 2017-04-05: 20 mg via INTRAVENOUS
  Administered 2017-04-05: 50 mg via INTRAVENOUS
  Administered 2017-04-05: 20 mg via INTRAVENOUS

## 2017-04-05 MED ORDER — ACETAMINOPHEN 325 MG PO TABS: 650.0000 mg | ORAL_TABLET | ORAL | Status: DC | PRN

## 2017-04-05 MED ORDER — DEXAMETHASONE SODIUM PHOSPHATE 10 MG/ML IJ SOLN
INTRAMUSCULAR | Status: DC | PRN
  Administered 2017-04-05: 10 mg via INTRAVENOUS

## 2017-04-05 MED ORDER — PROTAMINE SULFATE 10 MG/ML IV SOLN
INTRAVENOUS | Status: DC | PRN
  Administered 2017-04-05: 30 mg via INTRAVENOUS

## 2017-04-05 MED ORDER — PHENYLEPHRINE HCL 10 MG/ML IJ SOLN
INTRAVENOUS | Status: DC | PRN
  Administered 2017-04-05: 20 ug/min via INTRAVENOUS

## 2017-04-05 MED ORDER — AMLODIPINE BESYLATE 5 MG PO TABS
5.0000 mg | ORAL_TABLET | Freq: Every day | ORAL | Status: DC
  Administered 2017-04-05 – 2017-04-06 (×2): 5 mg via ORAL
  Filled 2017-04-05 (×2): qty 1

## 2017-04-05 MED ORDER — OXYCODONE HCL 5 MG PO TABS: 5.0000 mg | ORAL_TABLET | ORAL | Status: DC | PRN

## 2017-04-05 MED ORDER — BUPIVACAINE HCL (PF) 0.25 % IJ SOLN
INTRAMUSCULAR | Status: DC | PRN
Start: 2017-04-05 — End: 2017-04-05
  Administered 2017-04-05: 30 mL

## 2017-04-05 MED ORDER — SODIUM CHLORIDE 0.9% FLUSH: 3.0000 mL | INTRAVENOUS | Status: DC | PRN

## 2017-04-05 MED ORDER — IOPAMIDOL (ISOVUE-370) INJECTION 76%
INTRAVENOUS | Status: DC | PRN
  Administered 2017-04-05: 3 mL via INTRAVENOUS

## 2017-04-05 MED ORDER — LACTATED RINGERS IV SOLN
INTRAVENOUS | Status: DC | PRN
  Administered 2017-04-05: 12:00:00 via INTRAVENOUS

## 2017-04-05 MED ORDER — ONDANSETRON HCL 4 MG/2ML IJ SOLN: 4.0000 mg | Freq: Four times a day (QID) | INTRAMUSCULAR | Status: DC | PRN

## 2017-04-05 MED ORDER — BUPIVACAINE HCL (PF) 0.25 % IJ SOLN
INTRAMUSCULAR | Status: AC
  Filled 2017-04-05: qty 30

## 2017-04-05 MED ORDER — IOPAMIDOL (ISOVUE-370) INJECTION 76%
INTRAVENOUS | Status: AC
  Filled 2017-04-05: qty 50

## 2017-04-05 MED ORDER — OFF THE BEAT BOOK
Freq: Once | Status: DC
  Filled 2017-04-05: qty 1

## 2017-04-05 MED ORDER — ENSURE ENLIVE PO LIQD
237.0000 mL | Freq: Two times a day (BID) | ORAL | Status: DC
  Administered 2017-04-06: 10:00:00 237 mL via ORAL
  Filled 2017-04-05 (×4): qty 237

## 2017-04-05 MED ORDER — HEPARIN SODIUM (PORCINE) 1000 UNIT/ML IJ SOLN
INTRAMUSCULAR | Status: DC | PRN
  Administered 2017-04-05: 5000 [IU] via INTRAVENOUS
  Administered 2017-04-05: 6000 [IU] via INTRAVENOUS
  Administered 2017-04-05: 1000 [IU] via INTRAVENOUS

## 2017-04-05 MED ORDER — FLECAINIDE ACETATE 50 MG PO TABS
50.0000 mg | ORAL_TABLET | Freq: Two times a day (BID) | ORAL | Status: DC
  Administered 2017-04-05 – 2017-04-06 (×2): 50 mg via ORAL
  Filled 2017-04-05 (×3): qty 1

## 2017-04-05 MED ORDER — APIXABAN 5 MG PO TABS
5.0000 mg | ORAL_TABLET | Freq: Two times a day (BID) | ORAL | Status: DC
  Administered 2017-04-05 – 2017-04-06 (×2): 5 mg via ORAL
  Filled 2017-04-05 (×2): qty 1

## 2017-04-05 MED ORDER — SUCCINYLCHOLINE CHLORIDE 20 MG/ML IJ SOLN
INTRAMUSCULAR | Status: DC | PRN
  Administered 2017-04-05: 120 mg via INTRAVENOUS

## 2017-04-05 MED ORDER — LISINOPRIL 5 MG PO TABS
15.0000 mg | ORAL_TABLET | Freq: Every day | ORAL | Status: DC
  Administered 2017-04-06: 09:00:00 15 mg via ORAL
  Filled 2017-04-05: qty 1

## 2017-04-05 MED ORDER — FENTANYL CITRATE (PF) 100 MCG/2ML IJ SOLN
INTRAMUSCULAR | Status: DC | PRN
  Administered 2017-04-05 (×3): 50 ug via INTRAVENOUS

## 2017-04-05 MED ORDER — SUGAMMADEX SODIUM 200 MG/2ML IV SOLN
INTRAVENOUS | Status: DC | PRN
  Administered 2017-04-05: 200 mg via INTRAVENOUS

## 2017-04-05 MED ORDER — SODIUM CHLORIDE 0.9% FLUSH: 3.0000 mL | Freq: Two times a day (BID) | INTRAVENOUS | Status: DC

## 2017-04-05 MED ORDER — HEPARIN SODIUM (PORCINE) 1000 UNIT/ML IJ SOLN
INTRAMUSCULAR | Status: DC | PRN
  Administered 2017-04-05: 1000 [IU] via INTRAVENOUS
  Administered 2017-04-05: 12000 [IU] via INTRAVENOUS

## 2017-04-05 MED ORDER — LIDOCAINE HCL (CARDIAC) 20 MG/ML IV SOLN
INTRAVENOUS | Status: DC | PRN
  Administered 2017-04-05: 60 mg via INTRAVENOUS

## 2017-04-05 MED ORDER — SODIUM CHLORIDE 0.9 % IV SOLN
INTRAVENOUS | Status: DC
  Administered 2017-04-05: 10:00:00 via INTRAVENOUS

## 2017-04-05 MED ORDER — SODIUM CHLORIDE 0.9 % IV SOLN: 250.0000 mL | INTRAVENOUS | Status: DC | PRN

## 2017-04-05 SURGICAL SUPPLY — 18 items
BAG SNAP BAND KOVER 36X36 (MISCELLANEOUS) ×2 IMPLANT
BLANKET WARM UNDERBOD FULL ACC (MISCELLANEOUS) ×2 IMPLANT
CATH NAVISTAR SMARTTOUCH DF (ABLATOR) ×1 IMPLANT
CATH SOUNDSTAR ECO REPROCESSED (CATHETERS) ×1 IMPLANT
CATH VARIABLE LASSO NAV 2515 (CATHETERS) ×1 IMPLANT
CATH WEBSTER BI DIR CS D-F CRV (CATHETERS) ×1 IMPLANT
COVER SWIFTLINK CONNECTOR (BAG) ×2 IMPLANT
NDL TRANSEP BRK 71CM 407200 (NEEDLE) IMPLANT
NEEDLE TRANSEP BRK 71CM 407200 (NEEDLE) ×2 IMPLANT
PACK EP LATEX FREE (CUSTOM PROCEDURE TRAY) ×2
PACK EP LF (CUSTOM PROCEDURE TRAY) ×1 IMPLANT
PAD DEFIB LIFELINK (PAD) ×2 IMPLANT
PATCH CARTO3 (PAD) ×1 IMPLANT
SHEATH AVANTI 11F 11CM (SHEATH) ×1 IMPLANT
SHEATH PINNACLE 7F 10CM (SHEATH) ×2 IMPLANT
SHEATH PINNACLE 9F 10CM (SHEATH) ×1 IMPLANT
SHEATH SWARTZ TS SL2 63CM 8.5F (SHEATH) ×1 IMPLANT
TUBING SMART ABLATE COOLFLOW (TUBING) ×1 IMPLANT

## 2017-04-05 NOTE — H&P (Signed)
Chief Complaint  Patient presents with  . Atrial Fibrillation     History of Present Illness: Travis [name] is a 66 y.o. male who presents today for electrophysiology study and ablation for atrial fibrillatioin.   He reports increasing episodes of afib despite medical therapy with flecainide.  He also has some dizziness (not presyncope) with quick head turn which is felt to be due to flecainide.  He has OSA and uses CPAP.  He is on eliquis.  He has had hemorrhoid surgery and is now recovered.  Today, he denies symptoms of palpitations, chest pain, shortness of breath, orthopnea, PND, lower extremity edema, claudication, dizziness, presyncope, syncope, bleeding, or neurologic sequela. The patient is tolerating medications without difficulties and is otherwise without complaint today.        Past Medical History:  Diagnosis Date  . Anesthesia    Difficult to wake up   . Hypertension   . Impaired glucose tolerance   . Obesity (BMI 30-39.9) 07/26/2016  . OSA (obstructive sleep apnea) 07/26/2016   Mild with AHI 12.5/hr now on CPAP at 14cm H2O  . Paroxysmal atrial fibrillation (HCC)   . Thrombophlebitis         Past Surgical History:  Procedure Laterality Date  . FLEXIBLE SIGMOIDOSCOPY N/A 04/15/2015   Procedure:  UNSEDATED FLEXIBLE SIGMOIDOSCOPY;  Surgeon: [name], MD;  Location: [facility] END[facility];  Service: Endoscopy;  Laterality: N/A;  . TESTICLE SURGERY    . TONSILLECTOMY             Current Outpatient Prescriptions  Medication Sig Dispense Refill  . amLODipine (NORVASC) 10 MG tablet Take 5 mg by mouth daily.     [name] ELIQUIS 5 MG TABS tablet Take 1 tablet (5 mg total) by mouth 2 (two) times daily. 60 tablet 6  . flecainide (TAMBOCOR) 50 MG tablet Take 1 tablet (50 mg total) by mouth 2 (two) times daily. 60 tablet 11  . hydrochlorothiazide (MICROZIDE) 12.5 MG capsule Take 12.5 mg by mouth daily.    [name] lisinopril (PRINIVIL,ZESTRIL) 30 MG tablet Take 30  mg by mouth daily.    . metoprolol succinate (TOPROL-XL) 100 MG 24 hr tablet Take 100 mg by mouth daily. Take with or immediately following a meal.    . psyllium (METAMUCIL) 58.6 % powder Take 1 packet by mouth every evening.     . tadalafil (CIALIS) 20 MG tablet Take 20 mg by mouth daily as needed for erectile dysfunction.     No current facility-administered medications for this visit.     Allergies:   Patient has no known allergies.   Social History:  The patient  reports that he has never smoked. He has never used smokeless tobacco. He reports that he drinks alcohol. He reports that he does not use drugs.   Family History:  The patient's  family history includes Atrial fibrillation in his father; Dementia in his maternal grandmother and mother; Hypertension in his father and paternal grandfather; Stroke in his father.    ROS:  Please see the history of present illness.   All other systems are personally reviewed and negative.    PHYSICAL EXAM: Vitals:   04/05/17 0931  BP: (!) 165/93  Pulse: (!) 58  Temp: 97.9 F (36.6 C)  SpO2: 99%   GEN: Well nourished, well developed, in no acute distress  HEENT: normal  Neck: no JVD, carotid bruits, or masses Cardiac: RRR; no murmurs, rubs, or gallops,no edema  Respiratory:  clear to auscultation bilaterally,  normal work of breathing GI: soft, nontender, nondistended, + BS MS: no deformity or atrophy  Skin: warm and dry  Neuro:  Strength and sensation are intact Psych: euthymic mood, full affect    ASSESSMENT AND PLAN:  1.  Paroxysmal atrial fibrillation The patient has symptomatic afib.  He has failed medical therapy with flecainide.  chads2vasc score is 2.  He is on eliquis.  CTA is reviewed with the patient today and reveals no LAA thrombus.  Therapeutic strategies for afib including medicine and ablation were discussed in detail with the patient today. Risk, benefits, and alternatives to EP study and  radiofrequency ablation for afib were also discussed in detail today. These risks include but are not limited to stroke, bleeding, vascular damage, tamponade, perforation, damage to the esophagus, lungs, and other structures, pulmonary vein stenosis, worsening renal function, and death. The patient understands these risk and wishes to proceed at this time.     Thompson Grayer MD, [facility] 04/05/2017 11:35 AM

## 2017-04-05 NOTE — Transfer of Care (Signed)
Immediate Anesthesia Transfer of Care Note  Patient: Travis [name]  Procedure(s) Performed: ATRIAL FIBRILLATION ABLATION (N/A )  Patient Location: PACU and Cath Lab  Anesthesia Type:General  Level of Consciousness: awake, alert  and patient cooperative  Airway & Oxygen Therapy: Patient Spontanous Breathing and Patient connected to nasal cannula oxygen  Post-op Assessment: Report given to RN, Post -op Vital signs reviewed and stable, Patient moving all extremities and Patient able to stick tongue midline  Post vital signs: Reviewed and stable  Last Vitals:  Vitals:   04/05/17 0931 04/05/17 1425  BP: (!) 165/93   Pulse: (!) 58   Temp: 36.6 C (!) 36.2 C  SpO2: 99%     Last Pain:  Vitals:   04/05/17 1425  TempSrc: Temporal         Complications: No apparent anesthesia complications

## 2017-04-05 NOTE — Progress Notes (Signed)
Site area: rt groin fv sheaths x3 Site Prior to Removal:  Level  Pressure Applied For:  20 minutes Manual:   yes Patient Status During Pull:  stable Post Pull Site:  Level  0 Post Pull Instructions Given:  yes Post Pull Pulses Present: palpable Dressing Applied:  Gauze and tegaderm Bedrest begins @ 1550 Comments:  IV saline locked

## 2017-04-05 NOTE — Anesthesia Procedure Notes (Signed)
Procedure Name: Intubation Date/Time: 04/05/2017 12:07 PM Performed by: Effie Berkshire, MD Pre-anesthesia Checklist: Patient identified, Emergency Drugs available, Suction available and Patient being monitored Patient Re-evaluated:Patient Re-evaluated prior to induction Oxygen Delivery Method: Circle system utilized Preoxygenation: Pre-oxygenation with 100% oxygen Induction Type: IV induction Ventilation: Oral airway inserted - appropriate to patient size Laryngoscope Size: Glidescope Grade View: Grade III Tube type: Oral Tube size: 7.5 mm Number of attempts: 2 Airway Equipment and Method: Stylet and Video-laryngoscopy Placement Confirmation: ETT inserted through vocal cords under direct vision,  positive ETCO2 and breath sounds checked- equal and bilateral Secured at: 22 cm Tube secured with: Tape Comments: 1st attempt by Vickii Penna, SRNA DVL with Mac 4. 2nd attempt by Vickii Penna, SRNA with Glidescope-successful intubation +EtCO2, +bilateral breath sounds/ chest rise and fall; visualized ETT inserted through cords.

## 2017-04-05 NOTE — Anesthesia Postprocedure Evaluation (Signed)
Anesthesia Post Note  Patient: Travis [name]  Procedure(s) Performed: ATRIAL FIBRILLATION ABLATION (N/A )     Patient location during evaluation: PACU Anesthesia Type: General Level of consciousness: awake and alert Pain management: pain level controlled Vital Signs Assessment: post-procedure vital signs reviewed and stable Respiratory status: spontaneous breathing, nonlabored ventilation, respiratory function stable and patient connected to nasal cannula oxygen Cardiovascular status: blood pressure returned to baseline and stable Postop Assessment: no apparent nausea or vomiting Anesthetic complications: no    Last Vitals:  Vitals:   04/05/17 1750 04/05/17 1821  BP: (!) 157/75   Pulse: 65   Resp: 20   Temp:  37.1 C  SpO2: 92%     Last Pain:  Vitals:   04/05/17 1821  TempSrc: Oral                 Effie Berkshire

## 2017-04-05 NOTE — Progress Notes (Signed)
Dr. Rayann Heman notified of patients blood pressures 683'M systolic and patient took none of his antihypertensives today. Orders received to give amlodipine tonight. Scheduled dose to be given.

## 2017-04-06 ENCOUNTER — Encounter (HOSPITAL_COMMUNITY): Payer: Self-pay | Admitting: Internal Medicine

## 2017-04-06 DIAGNOSIS — I480 Paroxysmal atrial fibrillation: Secondary | ICD-10-CM | POA: Diagnosis not present

## 2017-04-06 MED ORDER — PANTOPRAZOLE SODIUM 40 MG PO TBEC: 40.0000 mg | DELAYED_RELEASE_TABLET | Freq: Every day | ORAL | 0 refills | Status: DC

## 2017-04-06 NOTE — Care Management Note (Signed)
Case Management Note  Patient Details  Name: Travis [name] MRN: [id number] Date of Birth: January 09, 1951  Subjective/Objective:    From home with wife, who will be with him 24/7. S/p afib ablation.  For dc today,no needs.                 Action/Plan: DC home no needs.   Expected Discharge Date:  04/06/17               Expected Discharge Plan:  Home/Self Care  In-House Referral:     Discharge planning Services  CM Consult  Post Acute Care Choice:    Choice offered to:     DME Arranged:    DME Agency:     HH Arranged:    HH Agency:     Status of Service:  Completed, signed off  If discussed at [employer] of Stay Meetings, dates discussed:    Additional Comments:  Zenon Mayo, RN 04/06/2017, 10:10 AM

## 2017-04-06 NOTE — Discharge Summary (Signed)
ELECTROPHYSIOLOGY PROCEDURE DISCHARGE SUMMARY    Patient ID: Travis [name],  MRN: [id number], DOB/AGE: [birth year] 66 y.o.  Admit date: 04/05/2017 Discharge date: 04/06/2017  Primary Care Physician: Lajean Manes, MD  Primary Cardiologist: [name] Electrophysiologist: Thompson Grayer, MD  Primary Discharge Diagnosis:  1. Paroxysmal AFib     CHA2DS2Vasc is 2, on Eliquis, appropriately dosed  Secondary Discharge Diagnosis:  1. HTN  Procedures This Admission:  1.  Electrophysiology study and radiofrequency catheter ablation on 04/05/17 by Dr Thompson Grayer.   This study demonstrated  CONCLUSIONS: 1. Sinus rhythm upon presentation.   2. 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 3. Successful electrical isolation and anatomical encircling of all four pulmonary veins with radiofrequency current.  The right superior PV appears to have been the culprit for afib today. 4. No inducible arrhythmias following ablation  5. No early apparent complications.   Brief HPI: Travis [name] is a 66 y.o. male with a history of paroxysmal atrial fibrillation.  They have failed medical therapy with Flecainide. Risks, benefits, and alternatives to catheter ablation of atrial fibrillation were reviewed with the patient who wished to proceed.  The patient underwent cardiac CT prior to the procedure which demonstrated no LAA thrombus.    Hospital Course:  The patient was admitted and underwent EPS/RFCA of atrial fibrillation with details as outlined above.  They were monitored on telemetry overnight which demonstrated SR.  R Groin was without complication on the day of discharge.  The patient was examined by Dr.  Rayann Heman and considered to be stable for discharge.  Wound care and restrictions were reviewed with the patient.  The patient will be seen back by Roderic Palau, NP in 4 weeks and Dr Rayann Heman in 12 weeks for  post ablation follow up.      Physical Exam: Vitals:   04/05/17 2119 04/05/17 2330 04/06/17 0410 04/06/17 0742  BP: (!) 148/74 (!) 154/82 (!) 153/86 (!) 159/82  Pulse:  71 62 70  Resp:  17 16 20   Temp:  98.2 F (36.8 C) 97.9 F (36.6 C) 97.7 F (36.5 C)  TempSrc:  Oral Oral Oral  SpO2:  96% 97% 97%  Weight:   243 lb 9.7 oz (110.5 kg)   Height:        GEN- The patient is well appearing, alert and oriented x 3 today.   HEENT: normocephalic, atraumatic; sclera clear, conjunctiva pink; hearing intact  Lungs- CTA b/l, normal work of breathing.  No wheezes, rales, rhonchi Heart- RRR, no murmurs, rubs or gallops  GI- soft, non-tender, non-distended  Extremities- no clubbing, cyanosis, or edema; DP/PT/radial pulses 2+ bilaterally, R groin is soft, non-tender, no active bleeding, no hematoma/bruit MS- no significant deformity or atrophy Skin- warm and dry, no rash or lesion Psych- euthymic mood, full affect Neuro- strength and sensation are intact   Labs:   Lab Results  Component Value Date   WBC 10.4 03/28/2017   HGB 14.5 03/28/2017   HCT 41.6 03/28/2017   MCV 93 03/28/2017   PLT 223 03/28/2017   No results for input(s): NA, K, CL, CO2, BUN, CREATININE, CALCIUM, PROT, BILITOT, ALKPHOS, ALT, AST, GLUCOSE in the last 168 hours.  Invalid input(s): LABALBU   Discharge Medications:  Allergies as of 04/06/2017   No Known Allergies     Medication List    TAKE these medications   acetaminophen 500 MG tablet Commonly known as:  TYLENOL Take 1,000 mg by mouth every 6 (six) hours as needed for moderate pain or headache.   amLODipine 10 MG tablet Commonly known as:  NORVASC Take 5 mg by mouth daily.   b complex vitamins tablet Take 1 tablet by mouth daily.   celecoxib 200 MG capsule Commonly known as:  CELEBREX Take 200 mg by mouth daily as needed for mild pain.   cromolyn 5.2 MG/ACT nasal spray Commonly known as:  NASALCROM Place 1 spray into both nostrils at  bedtime.   docusate sodium 100 MG capsule Commonly known as:  COLACE Take 200 mg by mouth at bedtime.   ELIQUIS 5 MG Tabs tablet Generic drug:  apixaban Take 1 tablet (5 mg total) by mouth 2 (two) times daily.   flecainide 50 MG tablet Commonly known as:  TAMBOCOR Take 1 tablet (50 mg total) by mouth 2 (two) times daily.   hydrochlorothiazide 12.5 MG capsule Commonly known as:  MICROZIDE Take 12.5 mg by mouth daily.   lisinopril 30 MG tablet Commonly known as:  PRINIVIL,ZESTRIL Take 15 mg by mouth daily.   metoprolol succinate 100 MG 24 hr tablet Commonly known as:  TOPROL-XL TAKE 100 MG EVERY AM AND 50 MG EVERY PM What changed:    how much to take  how to take this  when to take this  additional instructions   MULTIVITAMIN PO Take 1 tablet by mouth daily.   oxyCODONE 5 MG immediate release tablet Commonly known as:  Oxy IR/ROXICODONE Take 1-2 tablets (5-10 mg total) by mouth every 4 (four) hours as needed for moderate pain, severe pain or breakthrough pain.   pantoprazole 40 MG tablet Commonly known as:  PROTONIX Take 1 tablet (40 mg total) by mouth daily.   polyethylene glycol packet Commonly known as:  MIRALAX / GLYCOLAX Take 17 g by mouth daily as needed for moderate constipation.   psyllium 58.6 % powder Commonly known as:  METAMUCIL Take 1 packet by mouth every evening.   tadalafil 20 MG tablet Commonly known as:  CIALIS Take 20 mg by mouth daily as needed for erectile dysfunction.       Disposition:  Home  Follow-up Information    [facility] Follow up on 05/04/2017.   Specialty:  Cardiology Why:  9:30AM  Contact information: [address] [id number] [facility] [phone]       Thompson Grayer, MD Follow up on 07/04/2017.   Specialty:  Cardiology Why:  10:00AM Contact information: [place] [address] [phone]           Duration of  Discharge Encounter: Greater than 30 minutes including physician time.  Signed, Tommye Standard, PA-C 04/06/2017 8:05 AM   I have seen, examined the patient, and reviewed the above assessment and plan.  Changes to above are made where necessary.  Doing well s/p ablation DC to home with routine wound care and follow-up.  Co Sign: Thompson Grayer, MD 04/06/2017 4:47 PM

## 2017-04-06 NOTE — Discharge Instructions (Signed)
No driving for 4 days. No lifting over 5 lbs for 1 week. No vigorous or sexual activity for 1 week. You may return to work in 1 week. Keep procedure site clean & dry. If you notice increased pain, swelling, bleeding or pus, call/return!  You may shower, but no soaking baths/hot tubs/pools for 1 week.   You have an appointment set up with the [facility].  Multiple studies have shown that being followed by a dedicated atrial fibrillation clinic in addition to the standard care you receive from your other physicians improves health. We believe that enrollment in the atrial fibrillation clinic will allow [place] to better care for you.   The phone number to the [facility] is [phone]. The clinic is staffed Monday through Friday from 8:30am to 5pm.  Parking Directions: The clinic is located in the Heart and Vascular Building connected to [facility]. 1)From [address] turn on to [address] and go to the 3rd entrance  (Heart and Vascular entrance) on the right. 2)Look to the right for Heart &Vascular Parking Garage. 3)A code for the entrance is required please call the clinic to receive this.   4)Take the elevators to the 1st floor. Registration is in the room with the glass walls at the end of the hallway.    Information on my medicine - ELIQUIS (apixaban)  Why was Eliquis prescribed for you? Eliquis was prescribed for you to reduce the risk of a blood clot forming that can cause a stroke if you have a medical condition called atrial fibrillation (a type of irregular heartbeat).  What do You need to know about Eliquis ? Take your Eliquis TWICE DAILY - one tablet in the morning and one tablet in the evening with or without food. If you have difficulty swallowing the tablet whole please discuss with your pharmacist how to take the medication safely.  Take Eliquis exactly as prescribed by your doctor and DO NOT stop taking Eliquis without talking  to the doctor who prescribed the medication.  Stopping may increase your risk of developing a stroke.  Refill your prescription before you run out.  After discharge, you should have regular check-up appointments with your healthcare provider that is prescribing your Eliquis.  In the future your dose may need to be changed if your kidney function or weight changes by a significant amount or as you get older.  What do you do if you miss a dose? If you miss a dose, take it as soon as you remember on the same day and resume taking twice daily.  Do not take more than one dose of ELIQUIS at the same time to make up a missed dose.  Important Safety Information A possible side effect of Eliquis is bleeding. You should call your healthcare provider right away if you experience any of the following: ? Bleeding from an injury or your nose that does not stop. ? Unusual colored urine (red or dark brown) or unusual colored stools (red or black). ? Unusual bruising for unknown reasons. ? A serious fall or if you hit your head (even if there is no bleeding).  Some medicines may interact with Eliquis and might increase your risk of bleeding or clotting while on Eliquis. To help avoid this, consult your healthcare provider or pharmacist prior to using any new prescription or non-prescription medications, including herbals, vitamins, non-steroidal anti-inflammatory drugs (NSAIDs) and supplements.  This website has more information on Eliquis (apixaban): http://www.eliquis.com/eliquis/home   If  you have any trouble parking or locating the clinic, please dont hesitate to call [phone].

## 2017-04-14 ENCOUNTER — Telehealth (HOSPITAL_COMMUNITY): Payer: Self-pay | Admitting: *Deleted

## 2017-04-14 NOTE — Telephone Encounter (Signed)
Pt called in this afternoon stating he has been in afib for the last 15 hours and is unsure of what to do. Pt states his HR is between 89-123 BP 130/80. Has not taken any extra medication today to try and reduce HR. Reminded pt it can be normal during healing phase first 3 months for afib/more afib. Discussed with Roderic Palau NP recommended taking 25mg  of metoprolol now and his normal dose of 50mg  tonight as usual and call tomorrow if still in afib to be seen.

## 2017-04-24 ENCOUNTER — Telehealth: Payer: Self-pay | Admitting: Internal Medicine

## 2017-04-24 NOTE — Telephone Encounter (Signed)
Patient called Woke up feeling a little weird  Flushed  Some discomfort under L arm to abdomen No dizziness  No palpitations   No SOB   Got on plane  In [place] at daughter's for 1 wk  No fever.  Told him to take it easy today  Watch food intake (nothing unusual)  I told him I do not think above a complication from ablation procedure  Call back if symptoms change

## 2017-04-27 ENCOUNTER — Ambulatory Visit: Payer: 59 | Admitting: Neurology

## 2017-04-30 ENCOUNTER — Telehealth: Payer: Self-pay | Admitting: Cardiology

## 2017-04-30 NOTE — Telephone Encounter (Signed)
Pt is in [place].  And his BP is elevated he will increase the amlodipine back to 10 mg and the lisinopril to 30 mg though he does not have enough lisinopril with him, he is coming back tomorrow so if BP remains above 931 systolic he will take an extra HCTZ until he can come home and have lisinopril.

## 2017-05-01 ENCOUNTER — Telehealth: Payer: Self-pay | Admitting: Internal Medicine

## 2017-05-01 NOTE — Telephone Encounter (Signed)
Patient called tonight   BP  12/29  159/97     3 PM (after 2.5 amlodipine) 144/84 6 PM 148/87   9 PM 157/94   Sunday  05/01/17  12  156/96 2 156/92 10:45  175/94    Flew home today (Sunday)  Says he is limiting salt x 2 days  Meds:  100 metoprolol 10 amlodipine 30 lisinoprol 12.5 HCTZ 50 bid flecanide 5 bid Eliquis   Recomm   Take additional 12.5 HCTZ    If BP continues to climb at 1 hour post the told pt to go to ED Otherwise will have office call with appt tomorrow.

## 2017-05-02 ENCOUNTER — Telehealth: Payer: Self-pay | Admitting: Internal Medicine

## 2017-05-02 NOTE — Telephone Encounter (Signed)
Called patient and left message to call back to confirm appt with Cecilie Kicks, PA on Wednesday 05/04/17 at 11AM for BP follow per [name].

## 2017-05-02 NOTE — Telephone Encounter (Signed)
New message    Patient called back to confirm message left for appointment on 1/2 with [facility]  per last note from nurse. Appointment added to schedule.

## 2017-05-04 ENCOUNTER — Ambulatory Visit: Payer: 59 | Admitting: Cardiology

## 2017-05-04 ENCOUNTER — Ambulatory Visit (HOSPITAL_COMMUNITY)
Admit: 2017-05-04 | Discharge: 2017-05-04 | Disposition: A | Discharge status: Home / Self Care | Payer: 59 | Attending: Nurse Practitioner | Admitting: Nurse Practitioner

## 2017-05-04 ENCOUNTER — Telehealth: Payer: Self-pay | Admitting: *Deleted

## 2017-05-04 ENCOUNTER — Other Ambulatory Visit: Payer: Self-pay

## 2017-05-04 ENCOUNTER — Encounter (HOSPITAL_COMMUNITY): Payer: Self-pay | Admitting: Nurse Practitioner

## 2017-05-04 VITALS — BP 142/80 | HR 60 | Ht 70.0 in | Wt 249.2 lb

## 2017-05-04 DIAGNOSIS — E669 Obesity, unspecified: Secondary | ICD-10-CM | POA: Diagnosis not present

## 2017-05-04 DIAGNOSIS — I480 Paroxysmal atrial fibrillation: Secondary | ICD-10-CM | POA: Diagnosis not present

## 2017-05-04 DIAGNOSIS — I10 Essential (primary) hypertension: Secondary | ICD-10-CM | POA: Insufficient documentation

## 2017-05-04 DIAGNOSIS — Z7901 Long term (current) use of anticoagulants: Secondary | ICD-10-CM | POA: Insufficient documentation

## 2017-05-04 DIAGNOSIS — Z79899 Other long term (current) drug therapy: Secondary | ICD-10-CM | POA: Diagnosis not present

## 2017-05-04 DIAGNOSIS — G4733 Obstructive sleep apnea (adult) (pediatric): Secondary | ICD-10-CM | POA: Diagnosis not present

## 2017-05-04 DIAGNOSIS — K219 Gastro-esophageal reflux disease without esophagitis: Secondary | ICD-10-CM | POA: Diagnosis not present

## 2017-05-04 DIAGNOSIS — Z6839 Body mass index (BMI) 39.0-39.9, adult: Secondary | ICD-10-CM | POA: Diagnosis not present

## 2017-05-04 NOTE — Progress Notes (Signed)
Primary Care Physician: Lajean Manes, MD Referring Physician: Dr. [name] is a 67 y.o. male with a h/o paroxysmal afib that had afib ablation 04/05/17 that is in the afib clinic for f/u. He reports some afib early after procedure but not noticing any currently. Continues on flecainide. No swallowing issues, groin issues.    Today, he denies symptoms of palpitations, chest pain, shortness of breath, orthopnea, PND, lower extremity edema, dizziness, presyncope, syncope, or neurologic sequela. The patient is tolerating medications without difficulties and is otherwise without complaint today.   Past Medical History:  Diagnosis Date  . Arthritis    "comes and goes; mostly in hands, occasionally elbow" (04/05/2017)  . Complication of anesthesia    "just wanted to sleep alot  for couple days S/P VARICOCELE OR" (04/05/2017)  . DVT (deep venous thrombosis) (HCC)    LLE  . Dysrhythmia    PVCs   . GERD (gastroesophageal reflux disease)   . History of hiatal hernia   . Hypertension   . Impaired glucose tolerance   . Obesity (BMI 30-39.9) 07/26/2016  . OSA on CPAP 07/26/2016   Mild with AHI 12.5/hr now on CPAP at 14cm H2O  . Paroxysmal atrial fibrillation (HCC)   . Pneumonia    "may have had walking pneumonia a few years ago" (04/05/2017)  . Thrombophlebitis    Past Surgical History:  Procedure Laterality Date  . ATRIAL FIBRILLATION ABLATION  04/05/2017  . ATRIAL FIBRILLATION ABLATION N/A 04/05/2017   Procedure: ATRIAL FIBRILLATION ABLATION;  Surgeon: Thompson Grayer, MD;  Location: [facility];  Service: Cardiovascular;  Laterality: N/A;  . EVALUATION UNDER ANESTHESIA WITH HEMORRHOIDECTOMY N/A 02/24/2017   Procedure: EXAM UNDER ANESTHESIA WITH  HEMORRHOIDECTOMY;  Surgeon: Michael Boston, MD;  Location: [facility];  Service: General;  Laterality: N/A;  . FLEXIBLE SIGMOIDOSCOPY N/A 04/15/2015   Procedure:  UNSEDATED FLEXIBLE SIGMOIDOSCOPY;  Surgeon: [name], MD;   Location: [facility];  Service: Endoscopy;  Laterality: N/A;  . KNEE ARTHROSCOPY Left ~ 2016  . 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 TONSILLECTOMY    . VARICOSE VEIN SURGERY Bilateral     Current Outpatient Medications  Medication Sig Dispense Refill  . acetaminophen (TYLENOL) 500 MG tablet Take 1,000 mg by mouth every 6 (six) hours as needed for moderate pain or headache.    [name] amLODipine (NORVASC) 10 MG tablet Take 10 mg by mouth daily.     [name] b complex vitamins tablet Take 1 tablet by mouth daily.    . cromolyn (NASALCROM) 5.2 MG/ACT nasal spray Place 1 spray into both nostrils at bedtime.    . docusate sodium (COLACE) 100 MG capsule Take 200 mg by mouth at bedtime.    [name] ELIQUIS 5 MG TABS tablet Take 1 tablet (5 mg total) by mouth 2 (two) times daily. 60 tablet 6  . flecainide (TAMBOCOR) 50 MG tablet Take 1 tablet (50 mg total) by mouth 2 (two) times daily. 60 tablet 11  . hydrochlorothiazide (MICROZIDE) 12.5 MG capsule Take 12.5 mg by mouth daily.    [name] lisinopril (PRINIVIL,ZESTRIL) 30 MG tablet Take 30 mg by mouth daily.     . metoprolol succinate (TOPROL-XL) 100 MG 24 hr tablet Take 100 mg by mouth daily. Take with or immediately following a meal.    . Multiple Vitamins-Minerals (MULTIVITAMIN PO) Take 1 tablet by mouth daily.    . pantoprazole (PROTONIX) 40 MG tablet Take 1 tablet (40 mg total)  by mouth daily. 45 tablet 0  . psyllium (METAMUCIL) 58.6 % powder Take 1 packet by mouth every evening.     . tadalafil (CIALIS) 20 MG tablet Take 20 mg by mouth daily as needed for erectile dysfunction.    [name] oxyCODONE (OXY IR/ROXICODONE) 5 MG immediate release tablet Take 1-2 tablets (5-10 mg total) by mouth every 4 (four) hours as needed for moderate pain, severe pain or breakthrough pain. (Patient not taking: Reported on 05/04/2017) 40 tablet 0   No current facility-administered medications for this encounter.     No Known Allergies  Social History   Socioeconomic History  . Marital  status: Married    Spouse name: Not on file  . Number of children: Not on file  . Years of education: Not on file  . Highest education level: Not on file  Social Needs  . Financial resource strain: Not on file  . Food insecurity - worry: Not on file  . Food insecurity - inability: Not on file  . Transportation needs - medical: Not on file  . Transportation needs - non-medical: Not on file  Occupational History  . Not on file  Tobacco Use  . Smoking status: Never Smoker  . Smokeless tobacco: Never Used  Substance and Sexual Activity  . Alcohol use: Yes    Alcohol/week: 8.4 oz    Types: 14 Glasses of wine per week    Comment: daily; a couple of glasses of wine every night   . Drug use: No  . Sexual activity: Not Currently  Other Topics Concern  . Not on file  Social History Narrative   Lives in [place] with spouse.  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   1 daughter    Family History  Problem Relation Age of Onset  . Dementia Mother   . Stroke Father   . Atrial fibrillation Father   . Hypertension Father   . Hypertension Paternal Grandfather   . Dementia Maternal Grandmother     ROS- All systems are reviewed and negative except as per the HPI above  Physical Exam: Vitals:   05/04/17 0949  BP: (!) 142/80  Pulse: 60  Weight: 249 lb 3.2 oz (113 kg)  Height: 5\' 10"  (1.778 m)   Wt Readings from Last 3 Encounters:  05/04/17 249 lb 3.2 oz (113 kg)  04/06/17 243 lb 9.7 oz (110.5 kg)  03/04/17 245 lb (111.1 kg)    Labs: Lab Results  Component Value Date   NA 143 03/28/2017   K 3.7 03/28/2017   CL 101 03/28/2017   CO2 25 03/28/2017   GLUCOSE 110 (H) 03/28/2017   BUN 16 03/28/2017   CREATININE 1.06 03/28/2017   CALCIUM 9.3 03/28/2017   MG 2.3 01/06/2017   Lab Results  Component Value Date   INR 0.98 05/24/2012   No results found for: CHOL, HDL, LDLCALC, TRIG   GEN- The patient is well appearing, alert and oriented x 3 today.   Head- normocephalic,  atraumatic Eyes-  Sclera clear, conjunctiva pink Ears- hearing intact Oropharynx- clear Neck- supple, no JVP Lymph- no cervical lymphadenopathy Lungs- Clear to ausculation bilaterally, normal work of breathing Heart- Regular rate and rhythm, no murmurs, rubs or gallops, PMI not laterally displaced GI- soft, NT, ND, + BS Extremities- no clubbing, cyanosis, or edema MS- no significant deformity or atrophy Skin- no rash or lesion Psych- euthymic mood, full affect Neuro- strength and sensation are intact  EKG- NSR at 60 bpm, pr int 186  bpm, qrs int 88 ms, qtc 440 ms Epic records reviewed    Assessment and Plan: 1. Afib S/p ablation and is doing well with low afib burden Continue flecainide 50 mg bid  Continue eliquis 5 mg bid for chadsvasc score of at least 2, know not to interrupt drug during 3 month healing period Can resume normal activities including walking for exercise Weight loss encuraged  2. HTN Had elevated BP over the weekend after after a high salt meal with beer Took extra HCTZ and normalized Reminded to avoid salt and limit alcohol to no more than 2 drinks a week  F/u Dr. Rayann Heman, 3/4 afib clinic as needed  Butch Penny C. Kimesha Claxton, [facility] [address] [place], [place] [address] [phone]

## 2017-05-04 NOTE — Telephone Encounter (Signed)
Per Cecilie Kicks, NP pt has appt with both Roderic Palau, NP with the A-fib clinic and Cecilie Kicks, NP. Per Cecilie Kicks, NP Roderic Palau, NP can monitor pt's BP and make medication adjustments if needed. Pt advised per Cecilie Kicks, NP we can cancel appt in our office today since he is seeing Roderic Palau, NP today. Advised pt if he wants to keep appt here in our office today that will be fine as well. Pt opts to cancel Cecilie Kicks, NP appt. Pt is agreeable to plan of care.

## 2017-05-13 ENCOUNTER — Ambulatory Visit (INDEPENDENT_AMBULATORY_CARE_PROVIDER_SITE_OTHER): Payer: 59 | Admitting: Neurology

## 2017-05-13 ENCOUNTER — Encounter: Payer: Self-pay | Admitting: Neurology

## 2017-05-13 ENCOUNTER — Other Ambulatory Visit (INDEPENDENT_AMBULATORY_CARE_PROVIDER_SITE_OTHER): Payer: 59

## 2017-05-13 VITALS — BP 130/80 | HR 61 | Ht 70.0 in | Wt 249.1 lb

## 2017-05-13 DIAGNOSIS — G629 Polyneuropathy, unspecified: Secondary | ICD-10-CM

## 2017-05-13 LAB — TSH: TSH: 1.54 u[IU]/mL (ref 0.35–4.50)

## 2017-05-13 LAB — FOLATE: FOLATE: 20.7 ng/mL (ref >5.9)

## 2017-05-13 LAB — VITAMIN B12: Vitamin B-12: 329 pg/mL (ref 211–911)

## 2017-05-13 NOTE — Patient Instructions (Signed)
Check labs  I will review your nerve testing and make further recommendations

## 2017-05-13 NOTE — Progress Notes (Signed)
[facility] Clinic Note - Initial Visit   Date: 05/13/17  Travis [name] MRN: [id number] DOB: [birth year]   Dear Dr. Felipa Eth:  Thank you for your kind referral of Travis [name] for consultation of bilateral feet numbness. Although his history is well known to you, please allow [place] to reiterate it for the purpose of our medical record. The patient was accompanied to the clinic by self.   History of Present Illness: Travis [name] is a 67 y.o. right-handed Caucasian male with atrial fibrillation s/p ablation (on eliquis), OSA on CPAP, GERD, and hypertension presenting for evaluation of bilateral feet numbness.    Starting around 2010, he noticed numbness and tingling over the left foot which over the past year, also developed similar symptoms on the right.  Symptoms are constant and involve both feet and left lower leg. He denies any weakness or back pain.  He has noticed that his left leg feels heavy after prolonged activity, such as playing golf.  He has not had any falls and denies imbalance.  No identifiable triggers, exacerbating factors, or alleviating factors.  He recalls having history of laser vein surgery over the left thigh about 8 months prior to symptom onset.    He usually drink 1.5 glasses of wine nightly for many years.  No personal history of diabetes or family history of neuropathy.   He had NCS/EMG performed by Dr. Domingo Cocking which was reportedly normal - results will be requested to review.  His diabetes screening has been normal.  He has remote history of vitamin B12 deficiency treated with oral supplementation.   Past Medical History:  Diagnosis Date  . Arthritis    "comes and goes; mostly in hands, occasionally elbow" (04/05/2017)  . Complication of anesthesia    "just wanted to sleep alot  for couple days S/P VARICOCELE OR" (04/05/2017)  . DVT (deep venous thrombosis) (HCC)    LLE  . Dysrhythmia    PVCs   . GERD (gastroesophageal  reflux disease)   . History of hiatal hernia   . Hypertension   . Impaired glucose tolerance   . Obesity (BMI 30-39.9) 07/26/2016  . OSA on CPAP 07/26/2016   Mild with AHI 12.5/hr now on CPAP at 14cm H2O  . Paroxysmal atrial fibrillation (HCC)   . Pneumonia    "may have had walking pneumonia a few years ago" (04/05/2017)  . Thrombophlebitis     Past Surgical History:  Procedure Laterality Date  . ATRIAL FIBRILLATION ABLATION  04/05/2017  . ATRIAL FIBRILLATION ABLATION N/A 04/05/2017   Procedure: ATRIAL FIBRILLATION ABLATION;  Surgeon: Thompson Grayer, MD;  Location: [facility];  Service: Cardiovascular;  Laterality: N/A;  . EVALUATION UNDER ANESTHESIA WITH HEMORRHOIDECTOMY N/A 02/24/2017   Procedure: EXAM UNDER ANESTHESIA WITH  HEMORRHOIDECTOMY;  Surgeon: Michael Boston, MD;  Location: [facility];  Service: General;  Laterality: N/A;  . FLEXIBLE SIGMOIDOSCOPY N/A 04/15/2015   Procedure:  UNSEDATED FLEXIBLE SIGMOIDOSCOPY;  Surgeon: [name], MD;  Location: [facility];  Service: Endoscopy;  Laterality: N/A;  . KNEE ARTHROSCOPY Left ~ 2016  . 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 TONSILLECTOMY    . VARICOSE VEIN SURGERY Bilateral      Medications:  Outpatient Encounter Medications as of 05/13/2017  Medication Sig  . amLODipine (NORVASC) 10 MG tablet Take 10 mg by mouth daily.   [name] b complex vitamins tablet Take 1 tablet by mouth daily.  . cromolyn (NASALCROM) 5.2 MG/ACT nasal  spray Place 1 spray into both nostrils at bedtime.  . docusate sodium (COLACE) 100 MG capsule Take 200 mg by mouth at bedtime.  [name] ELIQUIS 5 MG TABS tablet Take 1 tablet (5 mg total) by mouth 2 (two) times daily.  . flecainide (TAMBOCOR) 50 MG tablet Take 1 tablet (50 mg total) by mouth 2 (two) times daily.  . hydrochlorothiazide (MICROZIDE) 12.5 MG capsule Take 12.5 mg by mouth daily.  [name] lisinopril (PRINIVIL,ZESTRIL) 30 MG tablet Take 30 mg by mouth daily.   . metoprolol succinate (TOPROL-XL) 100 MG  24 hr tablet Take 100 mg by mouth daily. Take with or immediately following a meal.  . Multiple Vitamins-Minerals (MULTIVITAMIN PO) Take 1 tablet by mouth daily.  . psyllium (METAMUCIL) 58.6 % powder Take 1 packet by mouth every evening.   . [DISCONTINUED] acetaminophen (TYLENOL) 500 MG tablet Take 1,000 mg by mouth every 6 (six) hours as needed for moderate pain or headache.  . [DISCONTINUED] oxyCODONE (OXY IR/ROXICODONE) 5 MG immediate release tablet Take 1-2 tablets (5-10 mg total) by mouth every 4 (four) hours as needed for moderate pain, severe pain or breakthrough pain. (Patient not taking: Reported on 05/04/2017)  . [DISCONTINUED] pantoprazole (PROTONIX) 40 MG tablet Take 1 tablet (40 mg total) by mouth daily.  . [DISCONTINUED] tadalafil (CIALIS) 20 MG tablet Take 20 mg by mouth daily as needed for erectile dysfunction.   No facility-administered encounter medications on file as of 05/13/2017.      Allergies: No Known Allergies  Family History: Family History  Problem Relation Age of Onset  . Dementia Mother   . Stroke Father   . Atrial fibrillation Father   . Hypertension Father   . Hypertension Paternal Grandfather   . Dementia Maternal Grandmother     Social History: Social History   Tobacco Use  . Smoking status: Never Smoker  . Smokeless tobacco: Never Used  Substance Use Topics  . Alcohol use: Yes    Alcohol/week: 8.4 oz    Types: 14 Glasses of wine per week    Comment: daily; a couple of glasses of wine every night   . Drug use: No   Social History   Social History Narrative   Lives in [place] with spouse in a 2 story home.     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     Review of Systems:  CONSTITUTIONAL: No fevers, chills, night sweats, or weight loss.   EYES: No visual changes or eye pain ENT: No hearing changes.  No history of nose bleeds.   RESPIRATORY: No cough, wheezing and shortness of breath.   CARDIOVASCULAR:  Negative for chest pain, and palpitations.   GI: Negative for abdominal discomfort, blood in stools or black stools.  No recent change in bowel habits.   GU:  No history of incontinence.   MUSCLOSKELETAL: No history of joint pain or swelling.  No myalgias.   SKIN: Negative for lesions, rash, and itching.   HEMATOLOGY/ONCOLOGY: Negative for prolonged bleeding, bruising easily, and swollen nodes.  No history of cancer.   ENDOCRINE: Negative for cold or heat intolerance, polydipsia or goiter.   PSYCH:  No depression or anxiety symptoms.   NEURO: As Above.   Vital Signs:  BP 130/80   Pulse 61   Ht 5\' 10"  (1.778 m)   Wt 249 lb 2 oz (113 kg)   SpO2 97%   BMI 35.75 kg/m  Pain Scale: 0 on  a scale of 0-10   General Medical Exam:   General:  Well appearing, comfortable.   Eyes/ENT: see cranial nerve examination.   Neck: No masses appreciated.  Full range of motion without tenderness.  No carotid bruits. Respiratory:  Clear to auscultation, good air entry bilaterally.   Cardiac:  Regular rate and rhythm, no murmur.   Extremities:  No deformities, edema, or skin discoloration.  Skin:  No rashes or lesions.  Neurological Exam: MENTAL STATUS including orientation to time, place, person, recent and remote memory, attention span and concentration, language, and fund of knowledge is normal.  Speech is not dysarthric.  CRANIAL NERVES: II:  No visual field defects.  Unremarkable fundi.   III-IV-VI: Pupils equal round and reactive to light.  Normal conjugate, extra-ocular eye movements in all directions of gaze.  No nystagmus.  No ptosis.   V:  Normal facial sensation.     VII:  Normal facial symmetry and movements.  No pathologic facial reflexes.  VIII:  Normal hearing and vestibular function.   IX-X:  Normal palatal movement.   XI:  Normal shoulder shrug and head rotation.   XII:  Normal tongue strength and range of motion, no deviation or fasciculation.  MOTOR:  No atrophy, fasciculations  or abnormal movements.  No pronator drift.  Tone is normal.    Right Upper Extremity:    Left Upper Extremity:    Deltoid  5/5   Deltoid  5/5   Biceps  5/5   Biceps  5/5   Triceps  5/5   Triceps  5/5   Wrist extensors  5/5   Wrist extensors  5/5   Wrist flexors  5/5   Wrist flexors  5/5   Finger extensors  5/5   Finger extensors  5/5   Finger flexors  5/5   Finger flexors  5/5   Dorsal interossei  5/5   Dorsal interossei  5/5   Abductor pollicis  5/5   Abductor pollicis  5/5   Tone (Ashworth scale)  0  Tone (Ashworth scale)  0   Right Lower Extremity:    Left Lower Extremity:    Hip flexors  5/5   Hip flexors  5/5   Hip extensors  5/5   Hip extensors  5/5   Knee flexors  5/5   Knee flexors  5/5   Knee extensors  5/5   Knee extensors  5/5   Dorsiflexors  5/5   Dorsiflexors  5/5   Plantarflexors  5/5   Plantarflexors  5/5   Toe extensors  5/5   Toe extensors  5/5   Toe flexors  4/5   Toe flexors  4/5   Tone (Ashworth scale)  0  Tone (Ashworth scale)  0   MSRs:  Right                                                                 Left brachioradialis 2+  brachioradialis 2+  biceps 2+  biceps 2+  triceps 2+  triceps 2+  patellar 2+  patellar 2+  ankle jerk 1+  ankle jerk 1+  Hoffman no  Hoffman no  plantar response down  plantar response down   SENSORY:  Vibration is reduced to 40% at the ankles and  trace at the great toe bilaterally; temperature and pin prick is reduced in a gradient pattern distal to lower legs, worse on the left.  There is no sway with Rhomberg testing.  COORDINATION/GAIT: Normal finger-to- nose-finger and heel-to-shin.  Intact rapid alternating movements bilaterally.  Able to rise from a chair without using arms.  Gait narrow based and stable. Unsteady with tandem gait, but able to perform.  Stessed gait intact.    IMPRESSION: Mr. Hamblin is a 67 year-old man referred for evaluation of bilateral feel numbness and tingling, worse on the left.  His history and  exam is most suggestive of peripheral neuropathy.  However, he tells me that his NCS/EMG performed a few months ago was normal.  I will request this study to review personally.  Alteratively, small fiber neuropathy is possible, which is not detected by NCS/EMG.   I had extensive discussion with the patient regarding the pathogenesis, etiology, management, and natural course of neuropathy. Neuropathy tends to be slowly progressive, especially if a treatable etiology is not identified.  I would like to test for treatable causes of neuropathy. I discussed that in the vast majority of cases, despite checking for reversible causes, we are unable to find the underlying etiology and management is symptomatic.  He drinks 1.5 glasses of wine nightly, but states that between him and his wife they can almost finish a bottle, suggesting that actual consumption may be closer to 2-3 glasses.  He was informed that alcohol is the second most common cause of neuropathy and can try to cut back and see if symptoms improve.     PLAN/RECOMMENDATIONS:  1.  Check vitamin B12, vitamin B1, folate, copper, SPEP with IFE, TSH, MMA 2.  NCS/EMG results have been requested.  I will review and provide further recommendations. 3.  Neuralgesic medications declined as symptoms are not painful to start medications 4.  Fall precautions discussed, such as placing night lights to help with balance at night  Further recommendations will be based on his results  Thank you for allowing me to participate in patient's care.  If I can answer any additional questions, I would be pleased to do so.    Sincerely,    Lucciano Vitali K. Posey Pronto, DO

## 2017-05-18 LAB — PROTEIN ELECTROPHORESIS, SERUM
Abnormal Protein Band1: 0.7 g/dL — ABNORMAL HIGH
Albumin ELP: 4 g/dL (ref 3.8–4.8)
Alpha 1: 0.3 g/dL (ref 0.2–0.3)
Alpha 2: 0.7 g/dL (ref 0.5–0.9)
BETA 2: 0.8 g/dL — AB (ref 0.2–0.5)
BETA GLOBULIN: 0.4 g/dL (ref 0.4–0.6)
GAMMA GLOBULIN: 0.5 g/dL — AB (ref 0.8–1.7)
Total Protein: 6.6 g/dL (ref 6.1–8.1)

## 2017-05-18 LAB — VITAMIN B1: VITAMIN B1 (THIAMINE): 34 nmol/L — AB (ref 8–30)

## 2017-05-18 LAB — IMMUNOFIXATION ELECTROPHORESIS
IGM, SERUM: 37 mg/dL — AB (ref 48–271)
IMMUNOFIX ELECTR INT: DETECTED
IMMUNOGLOBULIN A: 562 mg/dL — AB (ref 81–463)
IgG (Immunoglobin G), Serum: 558 mg/dL — ABNORMAL LOW (ref 694–1618)

## 2017-05-18 LAB — COPPER, SERUM: COPPER: 103 ug/dL (ref 70–175)

## 2017-05-18 LAB — METHYLMALONIC ACID, SERUM: Methylmalonic Acid, Quant: 156 nmol/L (ref 87–318)

## 2017-05-19 ENCOUNTER — Telehealth: Payer: Self-pay | Admitting: *Deleted

## 2017-05-19 NOTE — Telephone Encounter (Signed)
-----   Message from Alda Berthold, DO sent at 05/19/2017  8:05 AM EST ----- Please inform pt that his labs show increased distribution of a protein which may be nonspecific findings, but can also be associated with neuropathy and other blood cell disorders.  To better understand this significance of this, I will seek the opinion of hematology.  The remaining labs are normal.

## 2017-05-19 NOTE — Telephone Encounter (Signed)
Left message for patient to call me back. 

## 2017-05-20 ENCOUNTER — Other Ambulatory Visit: Payer: Self-pay | Admitting: *Deleted

## 2017-05-20 ENCOUNTER — Telehealth: Payer: Self-pay | Admitting: Hematology and Oncology

## 2017-05-20 ENCOUNTER — Telehealth: Payer: Self-pay | Admitting: *Deleted

## 2017-05-20 DIAGNOSIS — G629 Polyneuropathy, unspecified: Secondary | ICD-10-CM

## 2017-05-20 DIAGNOSIS — R779 Abnormality of plasma protein, unspecified: Secondary | ICD-10-CM

## 2017-05-20 DIAGNOSIS — E8809 Other disorders of plasma-protein metabolism, not elsewhere classified: Secondary | ICD-10-CM

## 2017-05-20 NOTE — Telephone Encounter (Signed)
Spoke with patient regarding appointment date/time/location/ph#  °

## 2017-05-20 NOTE — Telephone Encounter (Signed)
Patient given results and referral sent to hematology.

## 2017-05-20 NOTE — Telephone Encounter (Signed)
-----   Message from Alda Berthold, DO sent at 05/19/2017  8:05 AM EST ----- Please inform pt that his labs show increased distribution of a protein which may be nonspecific findings, but can also be associated with neuropathy and other blood cell disorders.  To better understand this significance of this, I will seek the opinion of hematology.  The remaining labs are normal.

## 2017-06-16 ENCOUNTER — Telehealth: Payer: Self-pay

## 2017-06-16 ENCOUNTER — Other Ambulatory Visit (HOSPITAL_COMMUNITY): Payer: Self-pay | Admitting: Nurse Practitioner

## 2017-06-16 ENCOUNTER — Encounter: Payer: Self-pay | Admitting: Hematology and Oncology

## 2017-06-16 ENCOUNTER — Inpatient Hospital Stay: Payer: 59

## 2017-06-16 ENCOUNTER — Inpatient Hospital Stay: Payer: 59 | Attending: Hematology and Oncology | Admitting: Hematology and Oncology

## 2017-06-16 VITALS — BP 136/72 | HR 64 | Temp 98.4°F | Resp 18 | Ht 70.0 in | Wt 257.5 lb

## 2017-06-16 DIAGNOSIS — R778 Other specified abnormalities of plasma proteins: Secondary | ICD-10-CM

## 2017-06-16 DIAGNOSIS — Z86718 Personal history of other venous thrombosis and embolism: Secondary | ICD-10-CM

## 2017-06-16 DIAGNOSIS — Z7901 Long term (current) use of anticoagulants: Secondary | ICD-10-CM | POA: Diagnosis not present

## 2017-06-16 DIAGNOSIS — D472 Monoclonal gammopathy: Secondary | ICD-10-CM | POA: Diagnosis present

## 2017-06-16 DIAGNOSIS — G629 Polyneuropathy, unspecified: Secondary | ICD-10-CM | POA: Diagnosis not present

## 2017-06-16 DIAGNOSIS — R803 Bence Jones proteinuria: Secondary | ICD-10-CM | POA: Diagnosis not present

## 2017-06-16 DIAGNOSIS — I480 Paroxysmal atrial fibrillation: Secondary | ICD-10-CM | POA: Diagnosis not present

## 2017-06-16 DIAGNOSIS — E8809 Other disorders of plasma-protein metabolism, not elsewhere classified: Secondary | ICD-10-CM | POA: Insufficient documentation

## 2017-06-16 DIAGNOSIS — Z79899 Other long term (current) drug therapy: Secondary | ICD-10-CM

## 2017-06-16 LAB — CMP (CANCER CENTER ONLY)
ALBUMIN: 3.7 g/dL (ref 3.5–5.0)
ALK PHOS: 104 U/L (ref 40–150)
ALT: 27 U/L (ref 0–55)
AST: 22 U/L (ref 5–34)
Anion gap: 9 (ref 3–11)
BUN: 15 mg/dL (ref 7–26)
CALCIUM: 9.1 mg/dL (ref 8.4–10.4)
CO2: 28 mmol/L (ref 22–29)
CREATININE: 0.92 mg/dL (ref 0.70–1.30)
Chloride: 104 mmol/L (ref 98–109)
GFR, Estimated: >60 mL/min (ref >60)
GLUCOSE: 108 mg/dL (ref 70–140)
Potassium: 3.5 mmol/L (ref 3.5–5.1)
SODIUM: 141 mmol/L (ref 136–145)
Total Bilirubin: 0.3 mg/dL (ref 0.2–1.2)
Total Protein: 6.8 g/dL (ref 6.4–8.3)

## 2017-06-16 LAB — CBC WITH DIFFERENTIAL (CANCER CENTER ONLY)
Basophils Absolute: 0 10*3/uL (ref 0.0–0.1)
Basophils Relative: 1 %
EOS ABS: 0.2 10*3/uL (ref 0.0–0.5)
Eosinophils Relative: 2 %
HEMATOCRIT: 43.2 % (ref 38.4–49.9)
HEMOGLOBIN: 14.7 g/dL (ref 13.0–17.1)
LYMPHS ABS: 1.5 10*3/uL (ref 0.9–3.3)
LYMPHS PCT: 17 %
MCH: 31.4 pg (ref 27.2–33.4)
MCHC: 34.1 g/dL (ref 32.0–36.0)
MCV: 92 fL (ref 79.3–98.0)
MONOS PCT: 12 %
Monocytes Absolute: 1 10*3/uL — ABNORMAL HIGH (ref 0.1–0.9)
NEUTROS ABS: 5.8 10*3/uL (ref 1.5–6.5)
NEUTROS PCT: 68 %
Platelet Count: 232 10*3/uL (ref 140–400)
RBC: 4.7 MIL/uL (ref 4.20–5.82)
RDW: 13.3 % (ref 11.0–14.6)
WBC Count: 8.4 10*3/uL (ref 4.0–10.3)

## 2017-06-16 LAB — LACTATE DEHYDROGENASE: LDH: 159 U/L (ref 125–245)

## 2017-06-16 NOTE — Telephone Encounter (Signed)
Printed avs and calender upcoming appointment. Per 2/14 los

## 2017-06-17 LAB — KAPPA/LAMBDA LIGHT CHAINS
KAPPA FREE LGHT CHN: 99.8 mg/L — AB (ref 3.3–19.4)
KAPPA, LAMDA LIGHT CHAIN RATIO: 11.09 — AB (ref 0.26–1.65)
LAMDA FREE LIGHT CHAINS: 9 mg/L (ref 5.7–26.3)

## 2017-06-17 LAB — BETA 2 MICROGLOBULIN, SERUM: BETA 2 MICROGLOBULIN: 1.4 mg/L (ref 0.6–2.4)

## 2017-06-20 DIAGNOSIS — D472 Monoclonal gammopathy: Secondary | ICD-10-CM | POA: Diagnosis not present

## 2017-06-21 LAB — MULTIPLE MYELOMA PANEL, SERUM
ALPHA2 GLOB SERPL ELPH-MCNC: 0.7 g/dL (ref 0.4–1.0)
Albumin SerPl Elph-Mcnc: 3.5 g/dL (ref 2.9–4.4)
Albumin/Glob SerPl: 1.2 (ref 0.7–1.7)
Alpha 1: 0.2 g/dL (ref 0.0–0.4)
B-GLOBULIN SERPL ELPH-MCNC: 1.5 g/dL — AB (ref 0.7–1.3)
Gamma Glob SerPl Elph-Mcnc: 0.5 g/dL (ref 0.4–1.8)
Globulin, Total: 3 g/dL (ref 2.2–3.9)
IGG (IMMUNOGLOBIN G), SERUM: 592 mg/dL — AB (ref 700–1600)
IGM (IMMUNOGLOBULIN M), SRM: 34 mg/dL (ref 20–172)
IgA: 558 mg/dL — ABNORMAL HIGH (ref 61–437)
M PROTEIN SERPL ELPH-MCNC: 0.4 g/dL — AB
Total Protein ELP: 6.5 g/dL (ref 6.0–8.5)

## 2017-06-21 LAB — UIFE/LIGHT CHAINS/TP QN, 24-HR UR
% BETA, URINE: 50.7 %
ALBUMIN, U: 21.4 %
ALPHA 1 URINE: 4 %
Alpha 2, Urine: 10.8 %
FREE KAPPA/LAMBDA RATIO: 191.87 — AB (ref 2.04–10.37)
Free Kappa Lt Chains,Ur: 543 mg/L — ABNORMAL HIGH (ref 1.35–24.19)
Free Lambda Lt Chains,Ur: 2.83 mg/L (ref 0.24–6.66)
GAMMA GLOBULIN URINE: 13.1 %
M-SPIKE %, Urine: 33.9 % — ABNORMAL HIGH
M-Spike, Mg/24 Hr: 56 mg/24 hr — ABNORMAL HIGH
TOTAL PROTEIN, URINE-UPE24: 8.5 mg/dL
Total Protein, Urine-Ur/day: 166 mg/24 hr — ABNORMAL HIGH (ref 30–150)
Total Volume: 1950

## 2017-06-22 ENCOUNTER — Telehealth: Payer: Self-pay | Admitting: Hematology and Oncology

## 2017-06-22 NOTE — Telephone Encounter (Signed)
FAXED [place] ID [id number]

## 2017-06-30 ENCOUNTER — Telehealth: Payer: Self-pay

## 2017-06-30 ENCOUNTER — Ambulatory Visit: Payer: 59

## 2017-06-30 ENCOUNTER — Encounter: Payer: Self-pay | Admitting: Hematology and Oncology

## 2017-06-30 ENCOUNTER — Inpatient Hospital Stay (HOSPITAL_BASED_OUTPATIENT_CLINIC_OR_DEPARTMENT_OTHER): Payer: 59 | Admitting: Hematology and Oncology

## 2017-06-30 VITALS — BP 144/81 | HR 62 | Temp 98.7°F | Resp 20 | Ht 70.0 in | Wt 256.9 lb

## 2017-06-30 DIAGNOSIS — Z7901 Long term (current) use of anticoagulants: Secondary | ICD-10-CM | POA: Diagnosis not present

## 2017-06-30 DIAGNOSIS — D472 Monoclonal gammopathy: Secondary | ICD-10-CM | POA: Diagnosis not present

## 2017-06-30 DIAGNOSIS — Z79899 Other long term (current) drug therapy: Secondary | ICD-10-CM | POA: Diagnosis not present

## 2017-06-30 DIAGNOSIS — E8809 Other disorders of plasma-protein metabolism, not elsewhere classified: Secondary | ICD-10-CM | POA: Diagnosis not present

## 2017-06-30 DIAGNOSIS — Z86718 Personal history of other venous thrombosis and embolism: Secondary | ICD-10-CM

## 2017-06-30 DIAGNOSIS — R803 Bence Jones proteinuria: Secondary | ICD-10-CM

## 2017-06-30 DIAGNOSIS — G629 Polyneuropathy, unspecified: Secondary | ICD-10-CM | POA: Diagnosis not present

## 2017-06-30 DIAGNOSIS — I480 Paroxysmal atrial fibrillation: Secondary | ICD-10-CM | POA: Diagnosis not present

## 2017-06-30 NOTE — Assessment & Plan Note (Signed)
67 y.o. 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  Will evaluate for possible presence of monoclonal gammopathy to suggest possible presence of myeloproliferative or lymphoproliferative disorder.  Plan: -Labs today as outlined below. -Return to clinic in 2 weeks to review.

## 2017-06-30 NOTE — Progress Notes (Signed)
[facility] Follow-up Visit:  Assessment: MGUS (monoclonal gammopathy of unknown significance) 67 y.o. 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  Subsequent evaluation confirms presence of monoclonal gammopathy of unknown significance with monoclonal IgG kappa.  Significant Bence-Jones proteinuria also discovered.  Additional evaluation is warranted at this time to confirm or refute possible presence of multiple myeloma.  Plan: -PET/CT -Consult interventional radiology for bone marrow biopsy -Return to clinic in 3 weeks to review the findings.  Voice recognition software was used and creation of this note. Despite my best effort at editing the text, some misspelling/errors may have occurred.  Orders Placed This Encounter  Procedures  . NM PET Image Initial (PI) Whole Body    Standing Status:   Future    Standing Expiration Date:   06/30/2018    Order Specific Question:   If indicated for the ordered procedure, I authorize the administration of a radiopharmaceutical per Radiology protocol    Answer:   Yes    Order Specific Question:   Preferred imaging location?    Answer:   [facility]    Order Specific Question:   Radiology Contrast Protocol - do NOT remove file path    Answer:   \\[email or web address]\epicdata\Radiant\[email or web address]    Order Specific Question:   Reason for Exam additional comments    Answer:   Multiple myeloma evaluation  . CT Biopsy    Standing Status:   Future    Standing Expiration Date:   06/30/2018    Order Specific Question:   Lab orders requested (DO NOT place separate lab orders, these will be automatically ordered during procedure specimen collection):    Answer:   Cytology - Non Pap    Comments:   Cytogenetics, MM FISH, molecular studies    Order Specific Question:   Lab orders requested (DO NOT place separate lab orders, these will be automatically ordered during procedure specimen  collection):    Answer:   Surgical Pathology    Order Specific Question:   Lab orders requested (DO NOT place separate lab orders, these will be automatically ordered during procedure specimen collection):    Answer:   Other    Order Specific Question:   Reason for Exam (SYMPTOM  OR DIAGNOSIS REQUIRED)    Answer:   Multiple myeloma evaluation    Order Specific Question:   Preferred imaging location?    Answer:   [facility]    Order Specific Question:   Radiology Contrast Protocol - do NOT remove file path    Answer:   \\[email or web address]\Radiant\CTProtocols.pdf  . CT BONE MARROW BIOPSY & ASPIRATION    Standing Status:   Future    Standing Expiration Date:   09/28/2018    Order Specific Question:   Reason for Exam (SYMPTOM  OR DIAGNOSIS REQUIRED)    Answer:   Multiple myeloma evaluation    Order Specific Question:   Preferred imaging location?    Answer:   [facility]    Order Specific Question:   Radiology Contrast Protocol - do NOT remove file path    Answer:   \\[email or web address]\epicdata\Radiant\CTProtocols.[email or web address]    Cancer Staging No matching staging information was found for the patient.  All questions were answered.  . The patient knows to call the clinic with any problems, questions or concerns.  This note was electronically signed.    History of Presenting Illness Travis [name] is a 67 y.o. male [facility] for monoclonal gammopathy of unknown significance in the setting of peripheral neuropathy, referred by Dr Alda Berthold.  Patient's past medical history is significant for arthritis, history of thrombophlebitis in the left lower extremity 32 years ago, GERD, obesity, obstructive sleep apnea, history of atrial fibrillation currently treated with apixaban.  Patient has developed bilateral lower extremity peripheral neuropathy after undergoing laser surgery procedure for varicose veins in the lower extremities in 2010.  Neuropathy remains  reasonably stable since that time without noticeable progression based on the patient's report.  Recently, due to persistent neuropathy he was undergoing evaluation which included the lab as outlined below.  At the present time, patient denies any fevers, chills, night sweats.  No unexpected weight loss.  Denies any new or progressive discomfort in the skeletal structures.  Patient returns to the clinic to review the results of the labs obtained during the previous visit in the clinic.  Oncological/hematological History: --Labs, 05/13/17: SPEP -- positive for "abnormal" protein, 0.7g/dL; IgG 558, IgA 562, IgM 37 --Labs, 06/16/17: tProt 6.8, Alb 3.7, Ca 9.1, Cr 0.9, AP 104, LDH 159, beta-2 microglobulin 1.4; SPEP -- MSpike 0.4g/dL, SIFE IgA kappa; IgG 592, IgA 558, IgM 34; kappa 99.8, lambda 9.0, KLR 11.09; UPEP -- MSpike 43m/24hrs, UIFE -- kappa light chains; u-Kappa 543, u-Lambda 192, u-KLR 2.83;   No history exists.    Medical History: Past Medical History:  Diagnosis Date  . Arthritis    "comes and goes; mostly in hands, occasionally elbow" (04/05/2017)  . Complication of anesthesia    "just wanted to sleep alot  for couple days S/P VARICOCELE OR" (04/05/2017)  . DVT (deep venous thrombosis) (HCC)    LLE  . Dysrhythmia    PVCs   . GERD (gastroesophageal reflux disease)   . History of hiatal hernia   . Hypertension   . Impaired glucose tolerance   . Obesity (BMI 30-39.9) 07/26/2016  . OSA on CPAP 07/26/2016   Mild with AHI 12.5/hr now on CPAP at 14cm H2O  . Paroxysmal atrial fibrillation (HCC)   . Pneumonia    "may have had walking pneumonia a few years ago" (04/05/2017)  . Thrombophlebitis     Surgical History: Past Surgical History:  Procedure Laterality Date  . ATRIAL FIBRILLATION ABLATION  04/05/2017  . ATRIAL FIBRILLATION ABLATION N/A 04/05/2017   Procedure: ATRIAL FIBRILLATION ABLATION;  Surgeon: AThompson Grayer MD;  [facility];  Service: Cardiovascular;   Laterality: N/A;  . EVALUATION UNDER ANESTHESIA WITH HEMORRHOIDECTOMY N/A 02/24/2017   Procedure: EXAM UNDER ANESTHESIA WITH  HEMORRHOIDECTOMY;  Surgeon: GMichael Boston MD;  Location: [facility];  Service: General;  Laterality: N/A;  . FLEXIBLE SIGMOIDOSCOPY N/A 04/15/2015   Procedure:  UNSEDATED FLEXIBLE SIGMOIDOSCOPY;  Surgeon: MGarlan Fair MD;  Location: [facility];  Service: Endoscopy;  Laterality: N/A;  . KNEE ARTHROSCOPY Left ~ 2016  . 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 TONSILLECTOMY    . VARICOSE VEIN SURGERY Bilateral     Family History: Family History  Problem Relation Age of Onset  . Dementia Mother   . Stroke Father   . Atrial fibrillation Father   . Hypertension Father   . Hypertension Paternal Grandfather   . Dementia Maternal Grandmother     Social History: Social History   Socioeconomic History  . Marital status: Married    Spouse name: Not on file  . Number of children: 1  . Years of education: law school  . Highest education  level: Not on file  Social Needs  . Financial resource strain: Not on file  . Food insecurity - worry: Not on file  . Food insecurity - inability: Not on file  . Transportation needs - medical: Not on file  . Transportation needs - non-medical: Not on file  Occupational History  . Occupation: Chief Executive Officer  Tobacco Use  . Smoking status: Never Smoker  . Smokeless tobacco: Never Used  Substance and Sexual Activity  . Alcohol use: Yes    Alcohol/week: 8.4 oz    Types: 14 Glasses of wine per week    Comment: daily; a couple of glasses of wine every night   . Drug use: No  . Sexual activity: Not Currently  Other Topics Concern  . Not on file  Social History Narrative   Lives in [place] with spouse in a 2 story home.     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     Allergies: No Known Allergies  Medications:  Current Outpatient Medications  Medication Sig Dispense Refill  . amLODipine  (NORVASC) 10 MG tablet Take 10 mg by mouth daily.     [name] b complex vitamins tablet Take 1 tablet by mouth daily.    . cromolyn (NASALCROM) 5.2 MG/ACT nasal spray Place 1 spray into both nostrils at bedtime.    . docusate sodium (COLACE) 100 MG capsule Take 200 mg by mouth at bedtime.    [name] ELIQUIS 5 MG TABS tablet TAKE 1 TABLET (5 MG TOTAL) BY MOUTH TWO TIMES DAILY. 60 tablet 6  . flecainide (TAMBOCOR) 50 MG tablet Take 1 tablet (50 mg total) by mouth 2 (two) times daily. 60 tablet 11  . hydrochlorothiazide (MICROZIDE) 12.5 MG capsule Take 12.5 mg by mouth daily.    [name] lisinopril (PRINIVIL,ZESTRIL) 30 MG tablet Take 30 mg by mouth daily.     . metoprolol succinate (TOPROL-XL) 100 MG 24 hr tablet Take 100 mg by mouth daily. Take with or immediately following a meal.    . Multiple Vitamins-Minerals (MULTIVITAMIN PO) Take 1 tablet by mouth daily.    . psyllium (METAMUCIL) 58.6 % powder Take 1 packet by mouth every evening.      No current facility-administered medications for this visit.     Review of Systems: Review of Systems  Neurological: Positive for numbness.  All other systems reviewed and are negative.    PHYSICAL EXAMINATION Blood pressure (!) 144/81, pulse 62, temperature 98.7 F (37.1 C), temperature source Oral, resp. rate 20, height _0  (1.778 m), weight 256 lb 14.4 oz (116.5 kg), SpO2 97 %.  ECOG PERFORMANCE STATUS: 1 - Symptomatic but completely ambulatory  Physical Exam  Constitutional: He is oriented to person, place, and time and well-developed, well-nourished, and in no distress. No distress.  HENT:  Head: Normocephalic and atraumatic.  Mouth/Throat: Oropharynx is clear and moist. No oropharyngeal exudate.  Eyes: Conjunctivae and EOM are normal. Pupils are equal, round, and reactive to light. No scleral icterus.  Neck: No thyromegaly present.  Cardiovascular: Normal rate, regular rhythm and normal heart sounds.  No murmur heard. Pulmonary/Chest: Breath sounds  normal. No respiratory distress. He has no wheezes. He has no rales.  Abdominal: Soft. Bowel sounds are normal. He exhibits no distension and no mass. There is no tenderness. There is no guarding.  Musculoskeletal: He exhibits no edema.  Lymphadenopathy:    He has no cervical adenopathy.  Neurological: He is alert and oriented to  person, place, and time. He has normal reflexes. No cranial nerve deficit.  Skin: Skin is warm and dry. No rash noted. He is not diaphoretic. No erythema. No pallor.     LABORATORY DATA: I have personally reviewed the data as listed: No visits with results within 1 Week(s) from this visit.  Latest known visit with results is:  Clinical Support on 06/16/2017  Component Date Value Ref Range Status  . WBC Count 06/16/2017 8.4  4.0 - 10.3 K/uL Final  . RBC 06/16/2017 4.70  4.20 - 5.82 MIL/uL Final  . Hemoglobin 06/16/2017 14.7  13.0 - 17.1 g/dL Final  . HCT 06/16/2017 43.2  38.4 - 49.9 % Final  . MCV 06/16/2017 92.0  79.3 - 98.0 fL Final  . MCH 06/16/2017 31.4  27.2 - 33.4 pg Final  . MCHC 06/16/2017 34.1  32.0 - 36.0 g/dL Final  . RDW 06/16/2017 13.3  11.0 - 14.6 % Final  . Platelet Count 06/16/2017 232  140 - 400 K/uL Final  . Neutrophils Relative % 06/16/2017 68  % Final  . Neutro Abs 06/16/2017 5.8  1.5 - 6.5 K/uL Final  . Lymphocytes Relative 06/16/2017 17  % Final  . Lymphs Abs 06/16/2017 1.5  0.9 - 3.3 K/uL Final  . Monocytes Relative 06/16/2017 12  % Final  . Monocytes Absolute 06/16/2017 1.0* 0.1 - 0.9 K/uL Final  . Eosinophils Relative 06/16/2017 2  % Final  . Eosinophils Absolute 06/16/2017 0.2  0.0 - 0.5 K/uL Final  . Basophils Relative 06/16/2017 1  % Final  . Basophils Absolute 06/16/2017 0.0  0.0 - 0.1 K/uL Final   Performed at [facility], [place] [address] [place], [place] [address]  . Sodium 06/16/2017 141  136 - 145 mmol/L Final  . Potassium 06/16/2017 3.5  3.5 - 5.1 mmol/L Final  . Chloride 06/16/2017 104  98 - 109  mmol/L Final  . CO2 06/16/2017 28  22 - 29 mmol/L Final  . Glucose, Bld 06/16/2017 108  70 - 140 mg/dL Final  . BUN 06/16/2017 15  7 - 26 mg/dL Final  . Creatinine 06/16/2017 0.92  0.70 - 1.30 mg/dL Final  . Calcium 06/16/2017 9.1  8.4 - 10.4 mg/dL Final  . Total Protein 06/16/2017 6.8  6.4 - 8.3 g/dL Final  . Albumin 06/16/2017 3.7  3.5 - 5.0 g/dL Final  . AST 06/16/2017 22  5 - 34 U/L Final  . ALT 06/16/2017 27  0 - 55 U/L Final  . Alkaline Phosphatase 06/16/2017 104  40 - 150 U/L Final  . Total Bilirubin 06/16/2017 0.3  0.2 - 1.2 mg/dL Final  . GFR, Est Non Af Am 06/16/2017 >60  >60 mL/min Final  . GFR, Est AFR Am 06/16/2017 >60  >60 mL/min Final   Comment: (NOTE) The eGFR has been calculated using the CKD EPI equation. This calculation has not been validated in all clinical situations. eGFR's persistently <60 mL/min signify possible Chronic Kidney Disease.   Georgiann Hahn gap 06/16/2017 9  3 - 11 Final   Performed at [facility], [place] [address], [place], [place] [address]  . LDH 06/16/2017 159  125 - 245 U/L Final   Performed at [facility], [place] [address] [place], [place] [address]  . Beta-2 Microglobulin 06/16/2017 1.4  0.6 - 2.4 mg/L Final   Comment: (NOTE) Siemens Immulite 2000 Immunochemiluminometric assay (ICMA) Values obtained with different assay methods or kits cannot be used  interchangeably. Results cannot be interpreted as absolute evidence of the presence or absence of malignant disease. Performed At: [facility] [place], [place] [id number] Rush Farmer MD [id number] Performed at [facility], [place] [address], [place], [place] [address]   . IgG (Immunoglobin G), Serum 06/16/2017 592* 700 - 1,600 mg/dL Final  . IgA 06/16/2017 558* 61 - 437 mg/dL Final  . IgM (Immunoglobulin M), Srm 06/16/2017 34  20 - 172 mg/dL Final  . Total Protein ELP 06/16/2017 6.5  6.0 - 8.5  g/dL Corrected  . Albumin SerPl Elph-Mcnc 06/16/2017 3.5  2.9 - 4.4 g/dL Corrected  . Alpha 1 06/16/2017 0.2  0.0 - 0.4 g/dL Corrected  . Alpha2 Glob SerPl Elph-Mcnc 06/16/2017 0.7  0.4 - 1.0 g/dL Corrected  . B-Globulin SerPl Elph-Mcnc 06/16/2017 1.5* 0.7 - 1.3 g/dL Corrected  . Gamma Glob SerPl Elph-Mcnc 06/16/2017 0.5  0.4 - 1.8 g/dL Corrected  . M Protein SerPl Elph-Mcnc 06/16/2017 0.4* Not Observed g/dL Corrected  . Globulin, Total 06/16/2017 3.0  2.2 - 3.9 g/dL Corrected  . Albumin/Glob SerPl 06/16/2017 1.2  0.7 - 1.7 Corrected  . IFE 1 06/16/2017 Comment   Corrected   Comment: (NOTE) Immunofixation shows IgA monoclonal protein with kappa light chain specificity.   . Please Note 06/16/2017 Comment   Corrected   Comment: (NOTE) Protein electrophoresis scan will follow via computer, mail, or courier delivery. Performed At: [facility] [place], [place] [id number] Rush Farmer MD [id number] Performed at [facility], [place] [address], [place], [place] [address]   . Kappa free light chain 06/16/2017 99.8* 3.3 - 19.4 mg/L Final  . Lamda free light chains 06/16/2017 9.0  5.7 - 26.3 mg/L Final  . Kappa, lamda light chain ratio 06/16/2017 11.09* 0.26 - 1.65 Final   Comment: (NOTE) Performed At: [facility] [place], [place] [id number] Rush Farmer MD [id number] Performed at [facility], [place] [address] [place], [place] [address]   . Total Protein, Urine 06/20/2017 8.5  Not Estab. mg/dL Final  . Total Protein, Urine-Ur/day 06/20/2017 166* 30 - 150 mg/24 hr Final  . Albumin, U 06/20/2017 21.4  % Final  . ALPHA 1 URINE 06/20/2017 4.0  % Final  . Alpha 2, Urine 06/20/2017 10.8  % Final  . % BETA, Urine 06/20/2017 50.7  % Final  . GAMMA GLOBULIN URINE 06/20/2017 13.1  % Final  . Free Kappa Lt Chains,Ur 06/20/2017 543.00* 1.35 - 24.19 mg/L Final   **Results verified by repeat  testing**  . Free Lambda Lt Chains,Ur 06/20/2017 2.83  0.24 - 6.66 mg/L Corrected   **Results verified by repeat testing**  . Free Kappa/Lambda Ratio 06/20/2017 191.87* 2.04 - 10.37 Corrected   Comment: (NOTE) Performed At: [facility] [address] [place], [place] [id number] Rush Farmer MD [id number]   . Immunofixation Result, Urine 06/20/2017 Comment   Corrected   Bence Jones Protein positive; kappa type.  . Total Volume 06/20/2017 1,950   Final  . M-SPIKE %, Urine 06/20/2017 33.9* Not Observed % Corrected  . M-Spike, Mg/24 Hr 06/20/2017 56* Not Observed mg/24 hr Corrected  . Note: 06/20/2017 Comment   Corrected   Comment: (NOTE) Protein electrophoresis scan will follow via computer, mail, or courier delivery. Performed at [facility], [place] [address], [place], [place] [address]        Ardath Sax, MD

## 2017-06-30 NOTE — Telephone Encounter (Signed)
Printed avs and calender of upcoming appointment. Per 2/28 los 

## 2017-06-30 NOTE — Assessment & Plan Note (Signed)
67 y.o. 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  Subsequent evaluation confirms presence of monoclonal gammopathy of unknown significance with monoclonal IgG kappa.  Significant Bence-Jones proteinuria also discovered.  Additional evaluation is warranted at this time to confirm or refute possible presence of multiple myeloma.  Plan: -PET/CT -Consult interventional radiology for bone marrow biopsy -Return to clinic in 3 weeks to review the findings.

## 2017-06-30 NOTE — Progress Notes (Signed)
[facility] New Visit:  Assessment: Abnormal serum protein electrophoresis 67 y.o. 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  Will evaluate for possible presence of monoclonal gammopathy to suggest possible presence of myeloproliferative or lymphoproliferative disorder.  Plan: -Labs today as outlined below. -Return to clinic in 2 weeks to review.  Voice recognition software was used and creation of this note. Despite my best effort at editing the text, some misspelling/errors may have occurred.  Orders Placed This Encounter  Procedures  . CBC with Differential (Cancer Center Only)    Standing Status:   Future    Number of Occurrences:   1    Standing Expiration Date:   06/16/2018  . CMP ([facility] only)    Standing Status:   Future    Number of Occurrences:   1    Standing Expiration Date:   06/16/2018  . Lactate dehydrogenase (LDH)    Standing Status:   Future    Number of Occurrences:   1    Standing Expiration Date:   06/16/2018  . Beta 2 microglobulin    Standing Status:   Future    Number of Occurrences:   1    Standing Expiration Date:   06/16/2018  . Multiple Myeloma Panel (SPEP&IFE w/QIG)    Standing Status:   Future    Number of Occurrences:   1    Standing Expiration Date:   06/16/2018  . 24-Hr Ur UPEP/UIFE/Light Chains/TP    Standing Status:   Future    Number of Occurrences:   1    Standing Expiration Date:   06/16/2018  . Kappa/lambda light chains    Standing Status:   Future    Number of Occurrences:   1    Standing Expiration Date:   06/16/2018    All questions were answered.  . The patient knows to call the clinic with any problems, questions or concerns.  This note was electronically signed.    History of Presenting Illness Travis [name] 67 y.o. presenting to the [facility] for dysproteinemia in the setting of peripheral neuropathy, referred by Dr Alda Berthold.  Patient's  past medical history is significant for arthritis, history of thrombophlebitis in the left lower extremity 32 years ago, GERD, obesity, obstructive sleep apnea, history of atrial fibrillation currently treated with apixaban.  Patient has developed bilateral lower extremity peripheral neuropathy after undergoing laser surgery procedure for varicose veins in the lower extremities in 2010.  Neuropathy remains reasonably stable since that time without noticeable progression based on the patient's report.  Recently, due to persistent neuropathy he was undergoing evaluation which included the lab as outlined below.  At the present time, patient denies any fevers, chills, night sweats.  No unexpected weight loss.  Denies any new or progressive discomfort in the skeletal structures.  Oncological/hematological History: --Labs, 05/13/17: SPEP -- positive for "abnormal" protein, 0.7g/dL; IgG 558, IgA 562, IgM 37  Medical History: Past Medical History:  Diagnosis Date  . Arthritis    "comes and goes; mostly in hands, occasionally elbow" (04/05/2017)  . Complication of anesthesia    "just wanted to sleep alot  for couple days S/P VARICOCELE OR" (04/05/2017)  . DVT (deep venous thrombosis) (HCC)    LLE  . Dysrhythmia    PVCs   . GERD (gastroesophageal reflux disease)   . History of hiatal hernia   . Hypertension   . Impaired glucose tolerance   .  Obesity (BMI 30-39.9) 07/26/2016  . OSA on CPAP 07/26/2016   Mild with AHI 12.5/hr now on CPAP at 14cm H2O  . Paroxysmal atrial fibrillation (HCC)   . Pneumonia    "may have had walking pneumonia a few years ago" (04/05/2017)  . Thrombophlebitis     Surgical History: Past Surgical History:  Procedure Laterality Date  . ATRIAL FIBRILLATION ABLATION  04/05/2017  . ATRIAL FIBRILLATION ABLATION N/A 04/05/2017   Procedure: ATRIAL FIBRILLATION ABLATION;  Surgeon: Thompson Grayer, MD;  Location: [facility];  Service: Cardiovascular;  Laterality: N/A;  .  EVALUATION UNDER ANESTHESIA WITH HEMORRHOIDECTOMY N/A 02/24/2017   Procedure: EXAM UNDER ANESTHESIA WITH  HEMORRHOIDECTOMY;  Surgeon: Michael Boston, MD;  Location: [facility];  Service: General;  Laterality: N/A;  . FLEXIBLE SIGMOIDOSCOPY N/A 04/15/2015   Procedure:  UNSEDATED FLEXIBLE SIGMOIDOSCOPY;  Surgeon: [name], MD;  Location: [facility];  Service: Endoscopy;  Laterality: N/A;  . KNEE ARTHROSCOPY Left ~ 2016  . 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 TONSILLECTOMY    . VARICOSE VEIN SURGERY Bilateral     Family History: Family History  Problem Relation Age of Onset  . Dementia Mother   . Stroke Father   . Atrial fibrillation Father   . Hypertension Father   . Hypertension Paternal Grandfather   . Dementia Maternal Grandmother     Social History: Social History   Socioeconomic History  . Marital status: Married    Spouse name: Not on file  . Number of children: 1  . Years of education: law school  . Highest education level: Not on file  Social Needs  . Financial resource strain: Not on file  . Food insecurity - worry: Not on file  . Food insecurity - inability: Not on file  . Transportation needs - medical: Not on file  . Transportation needs - non-medical: Not on file  Occupational History  . Occupation: Chief Executive Officer  Tobacco Use  . Smoking status: Never Smoker  . Smokeless tobacco: Never Used  Substance and Sexual Activity  . Alcohol use: Yes    Alcohol/week: 8.4 oz    Types: 14 Glasses of wine per week    Comment: daily; a couple of glasses of wine every night   . Drug use: No  . Sexual activity: Not Currently  Other Topics Concern  . Not on file  Social History Narrative   Lives in [place] with spouse in a 2 story home.     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     Allergies: No Known Allergies  Medications:  Current Outpatient Medications  Medication Sig Dispense Refill  . amLODipine (NORVASC) 10 MG  tablet Take 10 mg by mouth daily.     [name] b complex vitamins tablet Take 1 tablet by mouth daily.    . cromolyn (NASALCROM) 5.2 MG/ACT nasal spray Place 1 spray into both nostrils at bedtime.    . docusate sodium (COLACE) 100 MG capsule Take 200 mg by mouth at bedtime.    [name] ELIQUIS 5 MG TABS tablet TAKE 1 TABLET (5 MG TOTAL) BY MOUTH TWO TIMES DAILY. 60 tablet 6  . flecainide (TAMBOCOR) 50 MG tablet Take 1 tablet (50 mg total) by mouth 2 (two) times daily. 60 tablet 11  . hydrochlorothiazide (MICROZIDE) 12.5 MG capsule Take 12.5 mg by mouth daily.    [name] lisinopril (PRINIVIL,ZESTRIL) 30 MG tablet Take 30 mg by mouth daily.     [name]  metoprolol succinate (TOPROL-XL) 100 MG 24 hr tablet Take 100 mg by mouth daily. Take with or immediately following a meal.    . Multiple Vitamins-Minerals (MULTIVITAMIN PO) Take 1 tablet by mouth daily.    . psyllium (METAMUCIL) 58.6 % powder Take 1 packet by mouth every evening.      No current facility-administered medications for this visit.     Review of Systems: Review of Systems  Neurological: Positive for numbness.  All other systems reviewed and are negative.    PHYSICAL EXAMINATION Blood pressure 136/72, pulse 64, temperature 98.4 F (36.9 C), temperature source Oral, resp. rate 18, height _0  (1.778 m), weight 257 lb 8 oz (116.8 kg), SpO2 97 %.  ECOG PERFORMANCE STATUS: 1 - Symptomatic but completely ambulatory  Physical Exam  Constitutional: He is oriented to person, place, and time and well-developed, well-nourished, and in no distress. No distress.  HENT:  Head: Normocephalic and atraumatic.  Mouth/Throat: Oropharynx is clear and moist. No oropharyngeal exudate.  Eyes: Conjunctivae and EOM are normal. Pupils are equal, round, and reactive to light. No scleral icterus.  Neck: No thyromegaly present.  Cardiovascular: Normal rate, regular rhythm and normal heart sounds.  No murmur heard. Pulmonary/Chest: Breath sounds normal. No respiratory  distress. He has no wheezes. He has no rales.  Abdominal: Soft. Bowel sounds are normal. He exhibits no distension and no mass. There is no tenderness. There is no guarding.  Musculoskeletal: He exhibits no edema.  Lymphadenopathy:    He has no cervical adenopathy.  Neurological: He is alert and oriented to person, place, and time. He has normal reflexes. No cranial nerve deficit.  Skin: Skin is warm and dry. No rash noted. He is not diaphoretic. No erythema. No pallor.     LABORATORY DATA: I have personally reviewed the data as listed: Clinical Support on 06/16/2017  Component Date Value Ref Range Status  . WBC Count 06/16/2017 8.4  4.0 - 10.3 K/uL Final  . RBC 06/16/2017 4.70  4.20 - 5.82 MIL/uL Final  . Hemoglobin 06/16/2017 14.7  13.0 - 17.1 g/dL Final  . HCT 06/16/2017 43.2  38.4 - 49.9 % Final  . MCV 06/16/2017 92.0  79.3 - 98.0 fL Final  . MCH 06/16/2017 31.4  27.2 - 33.4 pg Final  . MCHC 06/16/2017 34.1  32.0 - 36.0 g/dL Final  . RDW 06/16/2017 13.3  11.0 - 14.6 % Final  . Platelet Count 06/16/2017 232  140 - 400 K/uL Final  . Neutrophils Relative % 06/16/2017 68  % Final  . Neutro Abs 06/16/2017 5.8  1.5 - 6.5 K/uL Final  . Lymphocytes Relative 06/16/2017 17  % Final  . Lymphs Abs 06/16/2017 1.5  0.9 - 3.3 K/uL Final  . Monocytes Relative 06/16/2017 12  % Final  . Monocytes Absolute 06/16/2017 1.0* 0.1 - 0.9 K/uL Final  . Eosinophils Relative 06/16/2017 2  % Final  . Eosinophils Absolute 06/16/2017 0.2  0.0 - 0.5 K/uL Final  . Basophils Relative 06/16/2017 1  % Final  . Basophils Absolute 06/16/2017 0.0  0.0 - 0.1 K/uL Final   Performed at [facility], [place] [address] [place], [place] [address]  . Sodium 06/16/2017 141  136 - 145 mmol/L Final  . Potassium 06/16/2017 3.5  3.5 - 5.1 mmol/L Final  . Chloride 06/16/2017 104  98 - 109 mmol/L Final  . CO2 06/16/2017 28  22 - 29 mmol/L Final  . Glucose, Bld 06/16/2017 108  70 -  140 mg/dL Final  . BUN  06/16/2017 15  7 - 26 mg/dL Final  . Creatinine 06/16/2017 0.92  0.70 - 1.30 mg/dL Final  . Calcium 06/16/2017 9.1  8.4 - 10.4 mg/dL Final  . Total Protein 06/16/2017 6.8  6.4 - 8.3 g/dL Final  . Albumin 06/16/2017 3.7  3.5 - 5.0 g/dL Final  . AST 06/16/2017 22  5 - 34 U/L Final  . ALT 06/16/2017 27  0 - 55 U/L Final  . Alkaline Phosphatase 06/16/2017 104  40 - 150 U/L Final  . Total Bilirubin 06/16/2017 0.3  0.2 - 1.2 mg/dL Final  . GFR, Est Non Af Am 06/16/2017 >60  >60 mL/min Final  . GFR, Est AFR Am 06/16/2017 >60  >60 mL/min Final   Comment: (NOTE) The eGFR has been calculated using the CKD EPI equation. This calculation has not been validated in all clinical situations. eGFR's persistently <60 mL/min signify possible Chronic Kidney Disease.   Georgiann Hahn gap 06/16/2017 9  3 - 11 Final   Performed at [facility], [place] [address], [place], [place] [address]  . LDH 06/16/2017 159  125 - 245 U/L Final   Performed at [facility], [place] [address] [place], [place] [address]  . Beta-2 Microglobulin 06/16/2017 1.4  0.6 - 2.4 mg/L Final   Comment: (NOTE) Siemens Immulite 2000 Immunochemiluminometric assay (ICMA) Values obtained with different assay methods or kits cannot be used interchangeably. Results cannot be interpreted as absolute evidence of the presence or absence of malignant disease. Performed At: [facility] [place], [place] [id number] Rush Farmer MD [id number] Performed at [facility], [place] [address], [place], [place] [address]   . IgG (Immunoglobin G), Serum 06/16/2017 592* 700 - 1,600 mg/dL Final  . IgA 06/16/2017 558* 61 - 437 mg/dL Final  . IgM (Immunoglobulin M), Srm 06/16/2017 34  20 - 172 mg/dL Final  . Total Protein ELP 06/16/2017 6.5  6.0 - 8.5 g/dL Corrected  . Albumin SerPl Elph-Mcnc 06/16/2017 3.5  2.9 - 4.4 g/dL Corrected  . Alpha 1 06/16/2017 0.2  0.0 -  0.4 g/dL Corrected  . Alpha2 Glob SerPl Elph-Mcnc 06/16/2017 0.7  0.4 - 1.0 g/dL Corrected  . B-Globulin SerPl Elph-Mcnc 06/16/2017 1.5* 0.7 - 1.3 g/dL Corrected  . Gamma Glob SerPl Elph-Mcnc 06/16/2017 0.5  0.4 - 1.8 g/dL Corrected  . M Protein SerPl Elph-Mcnc 06/16/2017 0.4* Not Observed g/dL Corrected  . Globulin, Total 06/16/2017 3.0  2.2 - 3.9 g/dL Corrected  . Albumin/Glob SerPl 06/16/2017 1.2  0.7 - 1.7 Corrected  . IFE 1 06/16/2017 Comment   Corrected   Comment: (NOTE) Immunofixation shows IgA monoclonal protein with kappa light chain specificity.   . Please Note 06/16/2017 Comment   Corrected   Comment: (NOTE) Protein electrophoresis scan will follow via computer, mail, or courier delivery. Performed At: [facility] [place], [place] [id number] Rush Farmer MD ID:[id number] Performed at [facility], [place] [address], [place], [place] [address]   . Kappa free light chain 06/16/2017 99.8* 3.3 - 19.4 mg/L Final  . Lamda free light chains 06/16/2017 9.0  5.7 - 26.3 mg/L Final  . Kappa, lamda light chain ratio 06/16/2017 11.09* 0.26 - 1.65 Final   Comment: (NOTE) Performed At: [facility] [place], [place] [id number] Rush Farmer MD [id number] Performed at [facility], [place] [address], [place], [place] [address]   .  Total Protein, Urine 06/20/2017 8.5  Not Estab. mg/dL Final  . Total Protein, Urine-Ur/day 06/20/2017 166* 30 - 150 mg/24 hr Final  . Albumin, U 06/20/2017 21.4  % Final  . ALPHA 1 URINE 06/20/2017 4.0  % Final  . Alpha 2, Urine 06/20/2017 10.8  % Final  . % BETA, Urine 06/20/2017 50.7  % Final  . GAMMA GLOBULIN URINE 06/20/2017 13.1  % Final  . Free Kappa Lt Chains,Ur 06/20/2017 543.00* 1.35 - 24.19 mg/L Final   **Results verified by repeat testing**  . Free Lambda Lt Chains,Ur 06/20/2017 2.83  0.24 - 6.66 mg/L Corrected   **Results verified by repeat  testing**  . Free Kappa/Lambda Ratio 06/20/2017 191.87* 2.04 - 10.37 Corrected   Comment: (NOTE) Performed At: [facility] [address] [place], [place] [id number] Rush Farmer MD [id number]   . Immunofixation Result, Urine 06/20/2017 Comment   Corrected   Bence Jones Protein positive; kappa type.  . Total Volume 06/20/2017 1,950   Final  . M-SPIKE %, Urine 06/20/2017 33.9* Not Observed % Corrected  . M-Spike, Mg/24 Hr 06/20/2017 56* Not Observed mg/24 hr Corrected  . Note: 06/20/2017 Comment   Corrected   Comment: (NOTE) Protein electrophoresis scan will follow via computer, mail, or courier delivery. Performed at [facility], [place] [address], [place], [place] [address]          Ardath Sax, MD

## 2017-07-04 ENCOUNTER — Ambulatory Visit (INDEPENDENT_AMBULATORY_CARE_PROVIDER_SITE_OTHER): Payer: 59 | Admitting: Internal Medicine

## 2017-07-04 ENCOUNTER — Encounter: Payer: Self-pay | Admitting: Internal Medicine

## 2017-07-04 VITALS — BP 132/78 | HR 60 | Ht 70.0 in | Wt 257.0 lb

## 2017-07-04 DIAGNOSIS — I480 Paroxysmal atrial fibrillation: Secondary | ICD-10-CM

## 2017-07-04 DIAGNOSIS — I119 Hypertensive heart disease without heart failure: Secondary | ICD-10-CM

## 2017-07-04 DIAGNOSIS — G4733 Obstructive sleep apnea (adult) (pediatric): Secondary | ICD-10-CM | POA: Diagnosis not present

## 2017-07-04 NOTE — Patient Instructions (Addendum)
Medication Instructions:  Your physician recommends that you continue on your current medications as directed. Please refer to the Current Medication list given to you today.  Labwork: None ordered.  Testing/Procedures: None ordered.  Follow-Up: Your physician wants you to follow-up in: [place] with Dr. Rayann Heman.    If you need a refill on your cardiac medications before your next appointment, please call your pharmacy.

## 2017-07-04 NOTE — Progress Notes (Signed)
PCP: Lajean Manes, MD Primary Cardiologist: Dr Thurnell Lose Travis [name] is a 67 y.o. male who presents today for routine electrophysiology followup.  Since his recent afib ablation, the patient reports doing very well.  he denies procedure related complications and is pleased with the results of the procedure.  Today, he denies symptoms of palpitations, chest pain, shortness of breath,  lower extremity edema, dizziness, presyncope, or syncope.  The patient is otherwise without complaint today.   Past Medical History:  Diagnosis Date  . Arthritis    "comes and goes; mostly in hands, occasionally elbow" (04/05/2017)  . Complication of anesthesia    "just wanted to sleep alot  for couple days S/P VARICOCELE OR" (04/05/2017)  . DVT (deep venous thrombosis) (HCC)    LLE  . Dysrhythmia    PVCs   . GERD (gastroesophageal reflux disease)   . History of hiatal hernia   . Hypertension   . Impaired glucose tolerance   . Obesity (BMI 30-39.9) 07/26/2016  . OSA on CPAP 07/26/2016   Mild with AHI 12.5/hr now on CPAP at 14cm H2O  . Paroxysmal atrial fibrillation (HCC)   . Pneumonia    "may have had walking pneumonia a few years ago" (04/05/2017)  . Thrombophlebitis    Past Surgical History:  Procedure Laterality Date  . ATRIAL FIBRILLATION ABLATION  04/05/2017  . ATRIAL FIBRILLATION ABLATION N/A 04/05/2017   Procedure: ATRIAL FIBRILLATION ABLATION;  Surgeon: Thompson Grayer, MD;  Location: [place] [facility];  Service: Cardiovascular;  Laterality: N/A;  . EVALUATION UNDER ANESTHESIA WITH HEMORRHOIDECTOMY N/A 02/24/2017   Procedure: EXAM UNDER ANESTHESIA WITH  HEMORRHOIDECTOMY;  Surgeon: Michael Boston, MD;  Location: [facility];  Service: General;  Laterality: N/A;  . FLEXIBLE SIGMOIDOSCOPY N/A 04/15/2015   Procedure:  UNSEDATED FLEXIBLE SIGMOIDOSCOPY;  Surgeon: [name], MD;  [facility];  Service: Endoscopy;  Laterality: N/A;  . KNEE ARTHROSCOPY Left ~ 2016  . TESTICLE SURGERY   1988   VARICOCELE  . TONSILLECTOMY    . VARICOSE VEIN SURGERY Bilateral     ROS- all systems are personally reviewed and negatives except as per HPI above  Current Outpatient Medications  Medication Sig Dispense Refill  . amLODipine (NORVASC) 10 MG tablet Take 10 mg by mouth daily.     [name] b complex vitamins tablet Take 1 tablet by mouth daily.    [name] docusate sodium (COLACE) 100 MG capsule Take 200 mg by mouth at bedtime.    [name] ELIQUIS 5 MG TABS tablet TAKE 1 TABLET (5 MG TOTAL) BY MOUTH TWO TIMES DAILY. 60 tablet 6  . flecainide (TAMBOCOR) 50 MG tablet Take 1 tablet (50 mg total) by mouth 2 (two) times daily. 60 tablet 11  . hydrochlorothiazide (MICROZIDE) 12.5 MG capsule Take 12.5 mg by mouth daily.    [name] lisinopril (PRINIVIL,ZESTRIL) 30 MG tablet Take 30 mg by mouth daily.     . metoprolol succinate (TOPROL-XL) 100 MG 24 hr tablet Take 100 mg by mouth daily. Take with or immediately following a meal.    . Multiple Vitamins-Minerals (MULTIVITAMIN PO) Take 1 tablet by mouth daily.    . Oxymetazoline HCl (NASAL SPRAY NA) Place into the nose as directed.    . psyllium (METAMUCIL) 58.6 % powder Take 1 packet by mouth every evening.      No current facility-administered medications for this visit.     Physical Exam: Vitals:   07/04/17 1034  Height: 5\' 10"  (1.778 m)  GEN- The patient is well appearing, alert and oriented x 3 today.   Head- normocephalic, atraumatic Eyes-  Sclera clear, conjunctiva pink Ears- hearing intact Oropharynx- clear Lungs- Clear to ausculation bilaterally, normal work of breathing Heart- Regular rate and rhythm, no murmurs, rubs or gallops, PMI not laterally displaced GI- soft, NT, ND, + BS Extremities- no clubbing, cyanosis, or edema  EKG tracing ordered today is personally reviewed and shows sinus rhythm 60 bpm, otherwise normal ekg  Assessment and Plan:  1. Paroxysmal atrial fibrillation Doing well s/p ablation chads2vasc score is 2.  Continue  eliquis I have offered to stop flecainide.  He would like to follow another 3 months and then stop it.  2. OSA Compliant with CPAP  3. HTN Stable No change required today  4. Obesity Body mass index is 36.86 kg/m. Lifestyle modification encouraged   Return to see me in 3 months  Thompson Grayer MD, [facility] 07/04/2017 10:55 AM

## 2017-07-05 ENCOUNTER — Telehealth: Payer: Self-pay | Admitting: Hematology and Oncology

## 2017-07-05 ENCOUNTER — Other Ambulatory Visit: Payer: Self-pay | Admitting: Hematology and Oncology

## 2017-07-05 DIAGNOSIS — D472 Monoclonal gammopathy: Secondary | ICD-10-CM

## 2017-07-05 NOTE — Telephone Encounter (Signed)
Spoke with patient regarding appointment for skeletal survey per 3/5 sch msg

## 2017-07-08 ENCOUNTER — Ambulatory Visit (HOSPITAL_COMMUNITY)
Admission: RE | Admit: 2017-07-08 | Discharge: 2017-07-08 | Disposition: A | Discharge status: Home / Self Care | Payer: 59 | Source: Ambulatory Visit | Attending: Hematology and Oncology | Admitting: Hematology and Oncology

## 2017-07-08 ENCOUNTER — Ambulatory Visit (HOSPITAL_COMMUNITY): Payer: 59

## 2017-07-08 DIAGNOSIS — D472 Monoclonal gammopathy: Secondary | ICD-10-CM | POA: Diagnosis present

## 2017-07-10 ENCOUNTER — Other Ambulatory Visit: Payer: Self-pay | Admitting: Radiology

## 2017-07-11 ENCOUNTER — Ambulatory Visit (HOSPITAL_COMMUNITY): Payer: 59

## 2017-07-12 ENCOUNTER — Ambulatory Visit (HOSPITAL_COMMUNITY)
Admission: RE | Admit: 2017-07-12 | Discharge: 2017-07-12 | Disposition: A | Discharge status: Home / Self Care | Payer: 59 | Source: Ambulatory Visit | Attending: Hematology and Oncology | Admitting: Hematology and Oncology

## 2017-07-12 ENCOUNTER — Encounter (HOSPITAL_COMMUNITY): Payer: Self-pay

## 2017-07-12 DIAGNOSIS — Z79899 Other long term (current) drug therapy: Secondary | ICD-10-CM | POA: Insufficient documentation

## 2017-07-12 DIAGNOSIS — I480 Paroxysmal atrial fibrillation: Secondary | ICD-10-CM | POA: Diagnosis not present

## 2017-07-12 DIAGNOSIS — Z86718 Personal history of other venous thrombosis and embolism: Secondary | ICD-10-CM | POA: Diagnosis not present

## 2017-07-12 DIAGNOSIS — I10 Essential (primary) hypertension: Secondary | ICD-10-CM | POA: Insufficient documentation

## 2017-07-12 DIAGNOSIS — Z7902 Long term (current) use of antithrombotics/antiplatelets: Secondary | ICD-10-CM | POA: Diagnosis not present

## 2017-07-12 DIAGNOSIS — E669 Obesity, unspecified: Secondary | ICD-10-CM | POA: Insufficient documentation

## 2017-07-12 DIAGNOSIS — G4733 Obstructive sleep apnea (adult) (pediatric): Secondary | ICD-10-CM | POA: Insufficient documentation

## 2017-07-12 DIAGNOSIS — K219 Gastro-esophageal reflux disease without esophagitis: Secondary | ICD-10-CM | POA: Insufficient documentation

## 2017-07-12 DIAGNOSIS — Z6836 Body mass index (BMI) 36.0-36.9, adult: Secondary | ICD-10-CM | POA: Diagnosis not present

## 2017-07-12 DIAGNOSIS — D472 Monoclonal gammopathy: Secondary | ICD-10-CM | POA: Diagnosis not present

## 2017-07-12 LAB — CBC WITH DIFFERENTIAL/PLATELET
Basophils Absolute: 0 10*3/uL (ref 0.0–0.1)
Basophils Relative: 0 %
Eosinophils Absolute: 0.2 10*3/uL (ref 0.0–0.7)
Eosinophils Relative: 2 %
HCT: 44.5 % (ref 39.0–52.0)
HEMOGLOBIN: 15.6 g/dL (ref 13.0–17.0)
LYMPHS ABS: 1.5 10*3/uL (ref 0.7–4.0)
LYMPHS PCT: 19 %
MCH: 32.1 pg (ref 26.0–34.0)
MCHC: 35.1 g/dL (ref 30.0–36.0)
MCV: 91.6 fL (ref 78.0–100.0)
Monocytes Absolute: 0.9 10*3/uL (ref 0.1–1.0)
Monocytes Relative: 12 %
NEUTROS PCT: 67 %
Neutro Abs: 5.4 10*3/uL (ref 1.7–7.7)
Platelets: 239 10*3/uL (ref 150–400)
RBC: 4.86 MIL/uL (ref 4.22–5.81)
RDW: 13.4 % (ref 11.5–15.5)
WBC: 8 10*3/uL (ref 4.0–10.5)

## 2017-07-12 LAB — PROTIME-INR
INR: 1.16
Prothrombin Time: 14.7 seconds (ref 11.4–15.2)

## 2017-07-12 MED ORDER — LIDOCAINE HCL (PF) 1 % IJ SOLN
INTRAMUSCULAR | Status: AC | PRN
  Administered 2017-07-12: 30 mL

## 2017-07-12 MED ORDER — SODIUM CHLORIDE 0.9 % IV SOLN
INTRAVENOUS | Status: DC
  Administered 2017-07-12: 09:00:00 via INTRAVENOUS

## 2017-07-12 MED ORDER — FENTANYL CITRATE (PF) 100 MCG/2ML IJ SOLN
INTRAMUSCULAR | Status: AC
  Filled 2017-07-12: qty 2

## 2017-07-12 MED ORDER — MIDAZOLAM HCL 2 MG/2ML IJ SOLN
INTRAMUSCULAR | Status: AC
  Filled 2017-07-12: qty 4

## 2017-07-12 MED ORDER — FENTANYL CITRATE (PF) 100 MCG/2ML IJ SOLN
INTRAMUSCULAR | Status: AC | PRN
  Administered 2017-07-12 (×2): 50 ug via INTRAVENOUS

## 2017-07-12 MED ORDER — MIDAZOLAM HCL 2 MG/2ML IJ SOLN
INTRAMUSCULAR | Status: AC | PRN
  Administered 2017-07-12 (×2): 1 mg via INTRAVENOUS

## 2017-07-12 NOTE — Consult Note (Signed)
Chief Complaint: Patient was seen in consultation today for CT-guided bone marrow biopsy  Referring Physician(s): Perlov,Mikhail G  Supervising Physician: Sandi Mariscal  Patient Status: [place]  History of Present Illness: Travis [name] is a 67 y.o. 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  Additional medical history as below.  Past Medical History:  Diagnosis Date  . Arthritis    "comes and goes; mostly in hands, occasionally elbow" (04/05/2017)  . Complication of anesthesia    "just wanted to sleep alot  for couple days S/P VARICOCELE OR" (04/05/2017)  . DVT (deep venous thrombosis) (HCC)    LLE  . Dysrhythmia    PVCs   . GERD (gastroesophageal reflux disease)   . History of hiatal hernia   . Hypertension   . Impaired glucose tolerance   . Obesity (BMI 30-39.9) 07/26/2016  . OSA on CPAP 07/26/2016   Mild with AHI 12.5/hr now on CPAP at 14cm H2O  . Paroxysmal atrial fibrillation (HCC)   . Pneumonia    "may have had walking pneumonia a few years ago" (04/05/2017)  . Thrombophlebitis     Past Surgical History:  Procedure Laterality Date  . ATRIAL FIBRILLATION ABLATION  04/05/2017  . ATRIAL FIBRILLATION ABLATION N/A 04/05/2017   Procedure: ATRIAL FIBRILLATION ABLATION;  Surgeon: Thompson Grayer, MD;  Location: [facility];  Service: Cardiovascular;  Laterality: N/A;  . EVALUATION UNDER ANESTHESIA WITH HEMORRHOIDECTOMY N/A 02/24/2017   Procedure: EXAM UNDER ANESTHESIA WITH  HEMORRHOIDECTOMY;  Surgeon: Michael Boston, MD;  Location: [facility];  Service: General;  Laterality: N/A;  . FLEXIBLE SIGMOIDOSCOPY N/A 04/15/2015   Procedure:  UNSEDATED FLEXIBLE SIGMOIDOSCOPY;  Surgeon: [name], MD;  Location: [facility];  Service: Endoscopy;  Laterality: N/A;  . KNEE ARTHROSCOPY Left ~ 2016  . 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  TONSILLECTOMY    . VARICOSE VEIN SURGERY Bilateral     Allergies: Patient has no known allergies.  Medications: Prior to Admission medications   Medication Sig Start Date End Date Taking? Authorizing Provider  amLODipine (NORVASC) 10 MG tablet Take 10 mg by mouth daily.    Yes [provider]  b complex vitamins tablet Take 1 tablet by mouth daily.   Yes [provider]  docusate sodium (COLACE) 100 MG capsule Take 200 mg by mouth at bedtime.   Yes [provider]  ELIQUIS 5 MG TABS tablet TAKE 1 TABLET (5 MG TOTAL) BY MOUTH TWO TIMES DAILY. 06/17/17  Yes Sherran Needs, NP  flecainide (TAMBOCOR) 50 MG tablet Take 1 tablet (50 mg total) by mouth 2 (two) times daily. 01/06/17  Yes Fay Records, MD  hydrochlorothiazide (MICROZIDE) 12.5 MG capsule Take 12.5 mg by mouth daily.   Yes [provider]  lisinopril (PRINIVIL,ZESTRIL) 30 MG tablet Take 30 mg by mouth daily.    Yes [provider]  metoprolol succinate (TOPROL-XL) 100 MG 24 hr tablet Take 100 mg by mouth daily. Take with or immediately following a meal.   Yes [provider]  Multiple Vitamins-Minerals (MULTIVITAMIN PO) Take 1 tablet by mouth daily.   Yes [provider]  Oxymetazoline HCl (NASAL SPRAY NA) Place into the nose as directed.   Yes [provider]  psyllium (METAMUCIL) 58.6 % powder Take 1 packet by mouth every evening.    Yes [provider]     Family History  Problem Relation Age of Onset  . Dementia Mother   . Stroke Father   . Atrial fibrillation Father   . Hypertension Father   . Hypertension Paternal Grandfather   . Dementia Maternal Grandmother     Social History   Socioeconomic History  . Marital status: Married    Spouse name: None  . Number of children: 1  . Years of education: law school  . Highest education level: None  Social Needs  . Financial resource strain: None  . Food insecurity - worry: None  . Food  insecurity - inability: None  . Transportation needs - medical: None  . Transportation needs - non-medical: None  Occupational History  . Occupation: Chief Executive Officer  Tobacco Use  . Smoking status: Never Smoker  . Smokeless tobacco: Never Used  Substance and Sexual Activity  . Alcohol use: Yes    Alcohol/week: 8.4 oz    Types: 14 Glasses of wine per week    Comment: daily; a couple of glasses of wine every night   . Drug use: No  . Sexual activity: Not Currently  Other Topics Concern  . None  Social History Narrative   Lives in [place] with spouse in a 2 story home.     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       Review of Systems currently denies fever, headache, chest pain, dyspnea, cough, abdominal pain, nausea, vomiting or bleeding.  He does have some intermittent low back pain  Vital Signs: BP (!) 149/88   Pulse 60   Temp 98.1 F (36.7 C) (Oral)   Resp 16   SpO2 94%   Physical Exam awake, alert.  Chest clear to auscultation bilaterally.  Heart with regular rate and rhythm.  Abdomen obese, soft, positive bowel sounds, nontender; trace to 1+ left greater than right lower extremity edema  Imaging: Dg Bone Survey Met  Result Date: 07/08/2017 CLINICAL DATA:  MGUS EXAM: METASTATIC BONE SURVEY COMPARISON:  Head CT 10/10/2016.  Chest x-ray 10/10/2016 FINDINGS: Lateral view of the skull shows multiple subcentimeter ill-defined lucent areas. These are stable from CT scanogram in 2018 when no aggressive lesion was seen on CT slices. No suspected myeloma in the skull. No visible spinal lesion or fracture. There is chronic L4-5 and L5-S1 disc height loss and lumbar mild levoscoliosis. No visible extremity lesion. Lucencies in the proximal left humerus are attributed to synovial inclusion spits. No visible pelvic or rib lesion. No evidence of acute cardiopulmonary disease. IMPRESSION: No suspected myeloma focus. There are lucencies over the calvarium that  are stable from 2018 when they had a benign appearance by CT. Electronically Signed   By: Monte Fantasia M.D.   On: 07/08/2017 14:42    Labs:  CBC: Recent Labs    02/16/17 1403 03/04/17 1813 03/28/17 1549 06/16/17 1510 07/12/17 0906  WBC 9.7 10.5 10.4 8.4 8.0  HGB 15.1 15.1 14.5  --  15.6  HCT 43.2 42.8 41.6 43.2 44.5  PLT 226 237 223 232 239    COAGS: No results for input(s): INR, APTT in the last 8760 hours.  BMP: Recent Labs    02/16/17 1403 03/04/17 1813 03/28/17 1549 06/16/17 1510  NA 141 135 143 141  K 3.6 3.7 3.7 3.5  CL 103 96* 101 104  CO2 '29 28 25 28  ' GLUCOSE 108* 99 110* 108  BUN '15 19 16 15  ' CALCIUM  9.0 8.9 9.3 9.1  CREATININE 0.98 1.13 1.06 0.92  GFRNONAA >60 >60 73 >60  GFRAA >60 >60 85 >60    LIVER FUNCTION TESTS: Recent Labs    03/04/17 1813 05/13/17 1510 06/16/17 1510  BILITOT 0.7  --  0.3  AST 46*  --  22  ALT 51  --  27  ALKPHOS 66  --  104  PROT 7.1 6.6 6.8  ALBUMIN 3.8  --  3.7    TUMOR MARKERS: No results for input(s): AFPTM, CEA, CA199, CHROMGRNA in the last 8760 hours.  Assessment and Plan: a 67 y.o. 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 Risks and benefits discussed with the patient/spouse including, but not limited to bleeding, infection, damage to adjacent structures or low yield requiring additional tests.  All of the patient's questions were answered, patient is agreeable to proceed. Consent signed and in chart.     Thank you for this interesting consult.  I greatly enjoyed meeting Travis [name] and look forward to participating in their care.  A copy of this report was sent to the requesting provider on this date.  Electronically Signed: D. Rowe Robert, PA-C 07/12/2017, 9:41 AM   I spent a total of  20 minutes   in face to face in clinical consultation, greater than 50% of which was  counseling/coordinating care for CT-guided bone marrow biopsy

## 2017-07-12 NOTE — Procedures (Signed)
Pre-procedure Diagnosis: MGUS Post-procedure Diagnosis: Same  Technically successful CT guided bone marrow aspiration and biopsy of left iliac crest.   Complications: None Immediate  EBL: None  SignedSandi Mariscal Pager: [phone] 07/12/2017, 10:47 AM

## 2017-07-12 NOTE — Discharge Instructions (Signed)
You may remove dressing and bathe in 24 hours.    Moderate Conscious Sedation, Adult, Care After These instructions provide you with information about caring for yourself after your procedure. Your health care provider may also give you more specific instructions. Your treatment has been planned according to current medical practices, but problems sometimes occur. Call your health care provider if you have any problems or questions after your procedure. What can I expect after the procedure? After your procedure, it is common:  To feel sleepy for several hours.  To feel clumsy and have poor balance for several hours.  To have poor judgment for several hours.  To vomit if you eat too soon.  Follow these instructions at home: For at least 24 hours after the procedure:   Do not: ? Participate in activities where you could fall or become injured. ? Drive. ? Use heavy machinery. ? Drink alcohol. ? Take sleeping pills or medicines that cause drowsiness. ? Make important decisions or sign legal documents. ? Take care of children on your own.  Rest. Eating and drinking  Follow the diet recommended by your health care provider.  If you vomit: ? Drink water, juice, or soup when you can drink without vomiting. ? Make sure you have little or no nausea before eating solid foods. General instructions  Have a responsible adult stay with you until you are awake and alert.  Take over-the-counter and prescription medicines only as told by your health care provider.  If you smoke, do not smoke without supervision.  Keep all follow-up visits as told by your health care provider. This is important. Contact a health care provider if:  You keep feeling nauseous or you keep vomiting.  You feel light-headed.  You develop a rash.  You have a fever. Get help right away if:  You have trouble breathing. This information is not intended to replace advice given to you by your health care  provider. Make sure you discuss any questions you have with your health care provider. Document Released: 02/07/2013 Document Revised: 09/22/2015 Document Reviewed: 08/09/2015 Elsevier Interactive Patient Education  2018 [employer].    Bone Marrow Aspiration and Bone Marrow Biopsy, Adult, Care After This sheet gives you information about how to care for yourself after your procedure. Your health care provider may also give you more specific instructions. If you have problems or questions, contact your health care provider. What can I expect after the procedure? After the procedure, it is common to have:  Mild pain and tenderness.  Swelling.  Bruising.  Follow these instructions at home:  Take over-the-counter or prescription medicines only as told by your health care provider.  Do not take baths, swim, or use a hot tub until your health care provider approves. Ask if you can take a shower or have a sponge bath.  Follow instructions from your health care provider about how to take care of the puncture site. Make sure you: ? Wash your hands with soap and water before you change your bandage (dressing). If soap and water are not available, use hand sanitizer. ? Change your dressing as told by your health care provider.  Check your puncture siteevery day for signs of infection. Check for: ? More redness, swelling, or pain. ? More fluid or blood. ? Warmth. ? Pus or a bad smell.  Return to your normal activities as told by your health care provider. Ask your health care provider what activities are safe for you.  Do not  drive for 24 hours if you were given a medicine to help you relax (sedative).  Keep all follow-up visits as told by your health care provider. This is important. Contact a health care provider if:  You have more redness, swelling, or pain around the puncture site.  You have more fluid or blood coming from the puncture site.  Your puncture site feels warm to  the touch.  You have pus or a bad smell coming from the puncture site.  You have a fever.  Your pain is not controlled with medicine. This information is not intended to replace advice given to you by your health care provider. Make sure you discuss any questions you have with your health care provider. Document Released: 11/06/2004 Document Revised: 11/07/2015 Document Reviewed: 10/01/2015 Elsevier Interactive Patient Education  2018 [employer].

## 2017-07-14 ENCOUNTER — Ambulatory Visit: Payer: 59 | Admitting: Hematology and Oncology

## 2017-07-21 ENCOUNTER — Inpatient Hospital Stay: Payer: 59 | Attending: Hematology and Oncology | Admitting: Hematology and Oncology

## 2017-07-21 ENCOUNTER — Encounter: Payer: Self-pay | Admitting: Hematology and Oncology

## 2017-07-21 ENCOUNTER — Telehealth: Payer: Self-pay | Admitting: Hematology and Oncology

## 2017-07-21 VITALS — BP 141/72 | HR 59 | Temp 97.8°F | Resp 18 | Wt 261.2 lb

## 2017-07-21 DIAGNOSIS — G4733 Obstructive sleep apnea (adult) (pediatric): Secondary | ICD-10-CM | POA: Diagnosis not present

## 2017-07-21 DIAGNOSIS — Z79899 Other long term (current) drug therapy: Secondary | ICD-10-CM | POA: Diagnosis not present

## 2017-07-21 DIAGNOSIS — G629 Polyneuropathy, unspecified: Secondary | ICD-10-CM

## 2017-07-21 DIAGNOSIS — K219 Gastro-esophageal reflux disease without esophagitis: Secondary | ICD-10-CM

## 2017-07-21 DIAGNOSIS — Z79890 Hormone replacement therapy (postmenopausal): Secondary | ICD-10-CM | POA: Diagnosis not present

## 2017-07-21 DIAGNOSIS — E669 Obesity, unspecified: Secondary | ICD-10-CM | POA: Diagnosis not present

## 2017-07-21 DIAGNOSIS — Z7901 Long term (current) use of anticoagulants: Secondary | ICD-10-CM | POA: Diagnosis not present

## 2017-07-21 DIAGNOSIS — D472 Monoclonal gammopathy: Secondary | ICD-10-CM

## 2017-07-21 DIAGNOSIS — Z6830 Body mass index (BMI) 30.0-30.9, adult: Secondary | ICD-10-CM

## 2017-07-21 DIAGNOSIS — I480 Paroxysmal atrial fibrillation: Secondary | ICD-10-CM | POA: Diagnosis not present

## 2017-07-21 DIAGNOSIS — C9000 Multiple myeloma not having achieved remission: Secondary | ICD-10-CM

## 2017-07-21 DIAGNOSIS — M1990 Unspecified osteoarthritis, unspecified site: Secondary | ICD-10-CM

## 2017-07-21 DIAGNOSIS — I10 Essential (primary) hypertension: Secondary | ICD-10-CM | POA: Diagnosis not present

## 2017-07-21 DIAGNOSIS — E8809 Other disorders of plasma-protein metabolism, not elsewhere classified: Secondary | ICD-10-CM | POA: Diagnosis present

## 2017-07-21 DIAGNOSIS — Z9889 Other specified postprocedural states: Secondary | ICD-10-CM | POA: Insufficient documentation

## 2017-07-21 NOTE — Progress Notes (Signed)
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  Subsequent evaluation with bone marrow biopsy demonstrates presence of multiple myeloma based on 12% involvement of the bone marrow with monoclonal plasma cell population. Based on CRAB criteria, patient qualifies for smoldering multiple myeloma --he has no hypercalcemia, creatinine elevation, lytic skeletal lesions, high percentage of bone marrow involvement with cytopenias, or high dysproteinemia.  Neuropathy we will not be attributable to the multiple myeloma at this point in time.  Additional assessment to determine whether patient would benefit from therapy initiation would involve PET/CT, fat pad biopsy for amyloidosis and echocardiogram.  Patient's insurance has denied PET/CT despite updated diagnosis of multiple myeloma presented to them in peer to peer.  This goes against the current standard as outlined in the NCCN guidelines which recommends confirmatory high-resolution testing such as whole body MRI or PET/CT in cases of negative skeletal survey.  I have discussed the potential cost with the patient and he does not believe that he would like to have a financial risk of having to pay for the PET scan out of the pocket if insurance does not grant approval.  At this time, we will proceed with the remainder of evaluation and will initiate monitoring for his myeloma diagnosis  Plan: -Echocardiogram -Consult general surgery for fat pad biopsy for amyloidosis staining -Return to clinic in 2 months with full multiple myeloma labs unless significant positive findings are discovered on the above mentioned studies.  Voice recognition software was used and creation of this note. Despite my best effort at editing the text, some misspelling/errors may have occurred.  Orders Placed This Encounter   Procedures  . Beta 2 microglobulin, serum    Standing Status:   Future    Standing Expiration Date:   07/22/2018  . CBC & Diff and Retic    Standing Status:   Future    Standing Expiration Date:   07/22/2018  . CMP ([place] only)    Standing Status:   Future    Standing Expiration Date:   07/22/2018  . Kappa/lambda light chains    Standing Status:   Future    Standing Expiration Date:   07/22/2018  . Lactate dehydrogenase    Standing Status:   Future    Standing Expiration Date:   07/22/2018  . SPEP & IFE with QIG    Standing Status:   Future    Standing Expiration Date:   07/22/2018  . UPEP/UIFE/Light Chains/TP, 24-Hr Ur    Standing Status:   Future    Standing Expiration Date:   07/22/2018  . Ambulatory referral to General Surgery    Referral Priority:   Routine    Referral Type:   Surgical    Referral Reason:   Specialty Services Required    Requested Specialty:   General Surgery    Number of Visits Requested:   1  . ECHOCARDIOGRAM COMPLETE    Patient with new diagnosis of multiple myeloma, please eval for evidence of amyloidosis    Standing Status:   Future    Standing Expiration Date:   10/22/2018    Order Specific Question:   Where should this test be performed    Answer:   [facility]    Order Specific Question:   Perflutren DEFINITY (image enhancing agent) should be administered unless hypersensitivity or allergy exist    Answer:   Administer  Perflutren    Order Specific Question:   Expected Date:    Answer:   1 week    Cancer Staging No matching staging information was found for the patient.  All questions were answered.  . The patient knows to call the clinic with any problems, questions or concerns.  This note was electronically signed.    History of Presenting Illness Travis [name] is a 67 y.o. male followed in the [facility] for monoclonal gammopathy of unknown significance in the setting of peripheral neuropathy, referred by Dr Alda Berthold.   Patient's past medical history is significant for arthritis, history of thrombophlebitis in the left lower extremity 32 years ago, GERD, obesity, obstructive sleep apnea, history of atrial fibrillation currently treated with apixaban.  Patient has developed bilateral lower extremity peripheral neuropathy after undergoing laser surgery procedure for varicose veins in the lower extremities in 2010.  Neuropathy remains reasonably stable since that time without noticeable progression based on the patient's report.  Recently, due to persistent neuropathy he was undergoing evaluation which included the lab as outlined below.  At the present time, patient denies any fevers, chills, night sweats.  No unexpected weight loss.  Denies any new or progressive discomfort in the skeletal structures.  Patient returns to the clinic to review the results of the labs obtained during the previous visit in the clinic.  Oncological/hematological History: **Smoldering Multiple Myeloma: --Labs, 05/13/17: SPEP -- positive for "abnormal" protein, 0.7g/dL; IgG 558, IgA 562, IgM 37 --Labs, 06/16/17: tProt 6.8, Alb 3.7, Ca 9.1, Cr 0.9, AP 104, LDH 159, beta-2 microglobulin 1.4; SPEP -- MSpike 0.4g/dL, SIFE IgA kappa; IgG 592, IgA 558, IgM 34; kappa 99.8, lambda 9.0, KLR 11.09; UPEP -- MSpike 64m/24hrs, UIFE -- kappa light chains; u-Kappa 543, u-Lambda 192, u-KLR 2.83; --BM Bx, 07/12/17: Hypercellular Bone marrow with 12% monoclonal plasma cell involvement. Cytogenetics and FISH pending   No history exists.    Medical History: Past Medical History:  Diagnosis Date  . Arthritis    "comes and goes; mostly in hands, occasionally elbow" (04/05/2017)  . Complication of anesthesia    "just wanted to sleep alot  for couple days S/P VARICOCELE OR" (04/05/2017)  . DVT (deep venous thrombosis) ([facility])    LLE  . Dysrhythmia    PVCs   . GERD (gastroesophageal reflux disease)   . History of hiatal hernia   . Hypertension   .  Impaired glucose tolerance   . Obesity (BMI 30-39.9) 07/26/2016  . OSA on CPAP 07/26/2016   Mild with AHI 12.5/hr now on CPAP at 14cm H2O  . Paroxysmal atrial fibrillation ([facility])   . Pneumonia    "may have had walking pneumonia a few years ago" (04/05/2017)  . Thrombophlebitis     Surgical History: Past Surgical History:  Procedure Laterality Date  . ATRIAL FIBRILLATION ABLATION  04/05/2017  . ATRIAL FIBRILLATION ABLATION N/A 04/05/2017   Procedure: ATRIAL FIBRILLATION ABLATION;  Surgeon: AThompson Grayer MD;  [facility];  Service: Cardiovascular;  Laterality: N/A;  . EVALUATION UNDER ANESTHESIA WITH HEMORRHOIDECTOMY N/A 02/24/2017   Procedure: EXAM UNDER ANESTHESIA WITH  HEMORRHOIDECTOMY;  Surgeon: GMichael Boston MD;  [facility]: [facility];  Service: General;  Laterality: N/A;  . FLEXIBLE SIGMOIDOSCOPY N/A 04/15/2015   Procedure:  UNSEDATED FLEXIBLE SIGMOIDOSCOPY;  Surgeon: MGarlan Fair MD;  [facility];  Service: Endoscopy;  Laterality: N/A;  . KNEE ARTHROSCOPY Left ~ 2016  . 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  TONSILLECTOMY    . VARICOSE VEIN SURGERY Bilateral     Family History: Family History  Problem Relation Age of Onset  . Dementia Mother   . Stroke Father   . Atrial fibrillation Father   . Hypertension Father   . Hypertension Paternal Grandfather   . Dementia Maternal Grandmother     Social History: Social History   Socioeconomic History  . Marital status: Married    Spouse name: Not on file  . Number of children: 1  . Years of education: law school  . Highest education level: Not on file  Occupational History  . Occupation: Chief Executive Officer  Social Needs  . Financial resource strain: Not on file  . Food insecurity:    Worry: Not on file    Inability: Not on file  . Transportation needs:    Medical: Not on file    Non-medical: Not on file  Tobacco Use  . Smoking status: Never Smoker  . Smokeless tobacco: Never Used  Substance and  Sexual Activity  . Alcohol use: Yes    Alcohol/week: 8.4 oz    Types: 14 Glasses of wine per week    Comment: daily; a couple of glasses of wine every night   . Drug use: No  . Sexual activity: Not Currently  Lifestyle  . Physical activity:    Days per week: Not on file    Minutes per session: Not on file  . Stress: Not on file  Relationships  . Social connections:    Talks on phone: Not on file    Gets together: Not on file    Attends religious service: Not on file    Active member of club or organization: Not on file    Attends meetings of clubs or organizations: Not on file    Relationship status: Not on file  . Intimate partner violence:    Fear of current or ex partner: Not on file    Emotionally abused: Not on file    Physically abused: Not on file    Forced sexual activity: Not on file  Other Topics Concern  . Not on file  Social History Narrative   Lives in [place] with spouse in a 2 story home.     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     Allergies: No Known Allergies  Medications:  Current Outpatient Medications  Medication Sig Dispense Refill  . amLODipine (NORVASC) 10 MG tablet Take 10 mg by mouth daily.     [name] b complex vitamins tablet Take 1 tablet by mouth daily.    [name] docusate sodium (COLACE) 100 MG capsule Take 200 mg by mouth at bedtime.    [name] ELIQUIS 5 MG TABS tablet TAKE 1 TABLET (5 MG TOTAL) BY MOUTH TWO TIMES DAILY. 60 tablet 6  . flecainide (TAMBOCOR) 50 MG tablet Take 1 tablet (50 mg total) by mouth 2 (two) times daily. 60 tablet 11  . hydrochlorothiazide (MICROZIDE) 12.5 MG capsule Take 12.5 mg by mouth daily.    [name] lisinopril (PRINIVIL,ZESTRIL) 30 MG tablet Take 30 mg by mouth daily.     . metoprolol succinate (TOPROL-XL) 100 MG 24 hr tablet Take 100 mg by mouth daily. Take with or immediately following a meal.    . Multiple Vitamins-Minerals (MULTIVITAMIN PO) Take 1 tablet by mouth daily.    . Oxymetazoline  HCl (NASAL SPRAY NA) Place into the nose as directed.    . psyllium (  METAMUCIL) 58.6 % powder Take 1 packet by mouth every evening.      No current facility-administered medications for this visit.     Review of Systems: Review of Systems  Neurological: Positive for numbness.  All other systems reviewed and are negative.    PHYSICAL EXAMINATION Blood pressure (!) 141/72, pulse (!) 59, temperature 97.8 F (36.6 C), temperature source Oral, resp. rate 18, weight 261 lb 3.2 oz (118.5 kg), SpO2 97 %.  ECOG PERFORMANCE STATUS: 1 - Symptomatic but completely ambulatory  Physical Exam  Constitutional: He is oriented to person, place, and time and well-developed, well-nourished, and in no distress. No distress.  HENT:  Head: Normocephalic and atraumatic.  Mouth/Throat: Oropharynx is clear and moist. No oropharyngeal exudate.  Eyes: Pupils are equal, round, and reactive to light. Conjunctivae and EOM are normal. No scleral icterus.  Neck: No thyromegaly present.  Cardiovascular: Normal rate, regular rhythm and normal heart sounds.  No murmur heard. Pulmonary/Chest: Breath sounds normal. No respiratory distress. He has no wheezes. He has no rales.  Abdominal: Soft. Bowel sounds are normal. He exhibits no distension and no mass. There is no tenderness. There is no guarding.  Musculoskeletal: He exhibits no edema.  Lymphadenopathy:    He has no cervical adenopathy.  Neurological: He is alert and oriented to person, place, and time. He has normal reflexes. No cranial nerve deficit.  Skin: Skin is warm and dry. No rash noted. He is not diaphoretic. No erythema. No pallor.     LABORATORY DATA: I have personally reviewed the data as listed: No visits with results within 1 Week(s) from this visit.  Latest known visit with results is:  Hospital Outpatient Visit on 07/12/2017  Component Date Value Ref Range Status  . WBC 07/12/2017 8.0  4.0 - 10.5 K/uL Final  . RBC 07/12/2017 4.86  4.22 -  5.81 MIL/uL Final  . Hemoglobin 07/12/2017 15.6  13.0 - 17.0 g/dL Final  . HCT 07/12/2017 44.5  39.0 - 52.0 % Final  . MCV 07/12/2017 91.6  78.0 - 100.0 fL Final  . MCH 07/12/2017 32.1  26.0 - 34.0 pg Final  . MCHC 07/12/2017 35.1  30.0 - 36.0 g/dL Final  . RDW 07/12/2017 13.4  11.5 - 15.5 % Final  . Platelets 07/12/2017 239  150 - 400 K/uL Final  . Neutrophils Relative % 07/12/2017 67  % Final  . Neutro Abs 07/12/2017 5.4  1.7 - 7.7 K/uL Final  . Lymphocytes Relative 07/12/2017 19  % Final  . Lymphs Abs 07/12/2017 1.5  0.7 - 4.0 K/uL Final  . Monocytes Relative 07/12/2017 12  % Final  . Monocytes Absolute 07/12/2017 0.9  0.1 - 1.0 K/uL Final  . Eosinophils Relative 07/12/2017 2  % Final  . Eosinophils Absolute 07/12/2017 0.2  0.0 - 0.7 K/uL Final  . Basophils Relative 07/12/2017 0  % Final  . Basophils Absolute 07/12/2017 0.0  0.0 - 0.1 K/uL Final   Performed at [facility], [place] [address], [place], [place] [address]  . Prothrombin Time 07/12/2017 14.7  11.4 - 15.2 seconds Final  . INR 07/12/2017 1.16   Final   Performed at [facility], [place] [address] [place], [place] [address]       Ardath Sax, MD

## 2017-07-21 NOTE — Assessment & Plan Note (Signed)
67 y.o. 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  Subsequent evaluation with bone marrow biopsy demonstrates presence of multiple myeloma based on 12% involvement of the bone marrow with monoclonal plasma cell population. Based on CRAB criteria, patient qualifies for smoldering multiple myeloma --he has no hypercalcemia, creatinine elevation, lytic skeletal lesions, high percentage of bone marrow involvement with cytopenias, or high dysproteinemia.  Neuropathy we will not be attributable to the multiple myeloma at this point in time.  Additional assessment to determine whether patient would benefit from therapy initiation would involve PET/CT, fat pad biopsy for amyloidosis and echocardiogram.  Patient's insurance has denied PET/CT despite updated diagnosis of multiple myeloma presented to them in peer to peer.  This goes against the current standard as outlined in the NCCN guidelines which recommends confirmatory high-resolution testing such as whole body MRI or PET/CT in cases of negative skeletal survey.  I have discussed the potential cost with the patient and he does not believe that he would like to have a financial risk of having to pay for the PET scan out of the pocket if insurance does not grant approval.  At this time, we will proceed with the remainder of evaluation and will initiate monitoring for his myeloma diagnosis  Plan: -Echocardiogram -Consult general surgery for fat pad biopsy for amyloidosis staining -Return to clinic in 2 months with full multiple myeloma labs unless significant positive findings are discovered on the above mentioned studies.

## 2017-07-21 NOTE — Telephone Encounter (Signed)
Scheduled apt per 3/21 los - Gave patient AVS and calender per los. - fax sent to [facility] per referral

## 2017-07-22 ENCOUNTER — Ambulatory Visit (HOSPITAL_COMMUNITY): Payer: 59

## 2017-07-25 ENCOUNTER — Encounter (HOSPITAL_COMMUNITY): Payer: Self-pay | Admitting: Hematology and Oncology

## 2017-07-27 LAB — CHROMOSOME ANALYSIS, BONE MARROW

## 2017-07-27 LAB — TISSUE HYBRIDIZATION (BONE MARROW)-NCBH

## 2017-07-29 ENCOUNTER — Ambulatory Visit: Payer: 59 | Admitting: Neurology

## 2017-07-29 ENCOUNTER — Ambulatory Visit (HOSPITAL_COMMUNITY)
Admission: RE | Admit: 2017-07-29 | Discharge: 2017-07-29 | Disposition: A | Discharge status: Home / Self Care | Payer: 59 | Source: Ambulatory Visit | Attending: Hematology and Oncology | Admitting: Hematology and Oncology

## 2017-07-29 DIAGNOSIS — C9000 Multiple myeloma not having achieved remission: Secondary | ICD-10-CM | POA: Diagnosis present

## 2017-07-29 DIAGNOSIS — I119 Hypertensive heart disease without heart failure: Secondary | ICD-10-CM | POA: Diagnosis not present

## 2017-07-29 DIAGNOSIS — I480 Paroxysmal atrial fibrillation: Secondary | ICD-10-CM | POA: Diagnosis not present

## 2017-07-29 DIAGNOSIS — I77810 Thoracic aortic ectasia: Secondary | ICD-10-CM | POA: Diagnosis not present

## 2017-07-29 DIAGNOSIS — D472 Monoclonal gammopathy: Secondary | ICD-10-CM

## 2017-07-29 NOTE — Progress Notes (Signed)
  Echocardiogram 2D Echocardiogram has been performed.  Travis [name] 07/29/2017, 10:54 AM

## 2017-08-01 ENCOUNTER — Telehealth: Payer: Self-pay

## 2017-08-01 NOTE — Telephone Encounter (Signed)
Patient called stating he has noticed increased numbness and tingling in his feet within the past week. Also stated he has some lower back pain. Patient wants to know if he should come in to be seen sooner than May. Dr. Lebron Conners made aware. Dr. Lebron Conners instructed patient to call if pain interferes with walking or handling small objects. Patient verbalized understanding Patient also stated he will call his PCP.

## 2017-08-10 ENCOUNTER — Encounter: Payer: Self-pay | Admitting: Cardiology

## 2017-08-16 ENCOUNTER — Ambulatory Visit (INDEPENDENT_AMBULATORY_CARE_PROVIDER_SITE_OTHER): Payer: 59 | Admitting: Cardiology

## 2017-08-16 ENCOUNTER — Encounter: Payer: Self-pay | Admitting: Cardiology

## 2017-08-16 VITALS — BP 140/76 | HR 62 | Ht 70.0 in | Wt 259.0 lb

## 2017-08-16 DIAGNOSIS — G4733 Obstructive sleep apnea (adult) (pediatric): Secondary | ICD-10-CM | POA: Diagnosis not present

## 2017-08-16 DIAGNOSIS — I10 Essential (primary) hypertension: Secondary | ICD-10-CM | POA: Diagnosis not present

## 2017-08-16 DIAGNOSIS — E669 Obesity, unspecified: Secondary | ICD-10-CM

## 2017-08-16 NOTE — Progress Notes (Signed)
Cardiology Office Note:    Date:  08/16/2017   ID:  Travis [name], DOB 11-10-1950, MRN [id number]  PCP:  Travis Manes, MD  Cardiologist:  No primary care provider on file.    Referring MD: Travis Manes, MD   No chief complaint on file.   History of Present Illness:    Travis [name] is a 67 y.o. male with a hx of persistent atrial fibrillation, HTN and OSA.  He was referred for repeat sleep study by Dr. Harrington [name] due to snoring and awakening with SOB.  His PSG  showed mild OSA with an AHi of 12.5/hr with oxygen desaturations to 85% with nocturnal hypoxemia.  He subsequently underwent CPAP titration to 14cm H2O.he is doing well with his CPAP device.  He tolerates the full face mask and feels the pressure is adequate.  Since going on CPAP he feels rested in the am and has no significant daytime sleepiness.  He has some mouth but no nasal dryness or nasal congestion.  He does not think that he snores.       Past Medical History:  Diagnosis Date  . Arthritis    "comes and goes; mostly in hands, occasionally elbow" (04/05/2017)  . Complication of anesthesia    "just wanted to sleep alot  for couple days S/P VARICOCELE OR" (04/05/2017)  . DVT (deep venous thrombosis) ([facility])    LLE  . Dysrhythmia    PVCs   . GERD (gastroesophageal reflux disease)   . History of hiatal hernia   . Hypertension   . Impaired glucose tolerance   . Obesity (BMI 30-39.9) 07/26/2016  . OSA on CPAP 07/26/2016   Mild with AHI 12.5/hr now on CPAP at 14cm H2O  . Paroxysmal atrial fibrillation (HCC)   . Pneumonia    "may have had walking pneumonia a few years ago" (04/05/2017)  . Thrombophlebitis     Past Surgical History:  Procedure Laterality Date  . ATRIAL FIBRILLATION ABLATION  04/05/2017  . ATRIAL FIBRILLATION ABLATION N/A 04/05/2017   Procedure: ATRIAL FIBRILLATION ABLATION;  Surgeon: Travis Grayer, MD;  Location: [facility];  Service: Cardiovascular;  Laterality: N/A;  . EVALUATION UNDER  ANESTHESIA WITH HEMORRHOIDECTOMY N/A 02/24/2017   Procedure: EXAM UNDER ANESTHESIA WITH  HEMORRHOIDECTOMY;  Surgeon: Travis Boston, MD;  [facility]: [facility];  Service: General;  Laterality: N/A;  . FLEXIBLE SIGMOIDOSCOPY N/A 04/15/2015   Procedure:  UNSEDATED FLEXIBLE SIGMOIDOSCOPY;  Surgeon: Travis Fair, MD;  [facility];  Service: Endoscopy;  Laterality: N/A;  . KNEE ARTHROSCOPY Left ~ 2016  . 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 TONSILLECTOMY    . VARICOSE VEIN SURGERY Bilateral     Current Medications: Current Meds  Medication Sig  . amLODipine (NORVASC) 10 MG tablet Take 10 mg by mouth daily.   [name] b complex vitamins tablet Take 1 tablet by mouth daily.  [name] docusate sodium (COLACE) 100 MG capsule Take 200 mg by mouth at bedtime.  [name] ELIQUIS 5 MG TABS tablet TAKE 1 TABLET (5 MG TOTAL) BY MOUTH TWO TIMES DAILY.  . flecainide (TAMBOCOR) 50 MG tablet Take 1 tablet (50 mg total) by mouth 2 (two) times daily.  [name] GABAPENTIN PO Take 200 mg by mouth 3 (three) times daily.  . hydrochlorothiazide (MICROZIDE) 12.5 MG capsule Take 12.5 mg by mouth daily.  [name] lisinopril (PRINIVIL,ZESTRIL) 30 MG tablet Take 30 mg by mouth daily.   . metoprolol succinate (TOPROL-XL) 100 MG 24 hr tablet Take  100 mg by mouth daily. Take with or immediately following a meal.  . Multiple Vitamins-Minerals (MULTIVITAMIN PO) Take 1 tablet by mouth daily.  . Oxymetazoline HCl (NASAL SPRAY NA) Place into the nose as directed.  . psyllium (METAMUCIL) 58.6 % powder Take 1 packet by mouth every evening.      Allergies:   Patient has no known allergies.   Social History   Socioeconomic History  . Marital status: Married    Spouse name: Not on file  . Number of children: 1  . Years of education: law school  . Highest education level: Not on file  Occupational History  . Occupation: Chief Executive Officer  Social Needs  . Financial resource strain: Not on file  . Food insecurity:    Worry: Not on file    Inability: Not  on file  . Transportation needs:    Medical: Not on file    Non-medical: Not on file  Tobacco Use  . Smoking status: Never Smoker  . Smokeless tobacco: Never Used  Substance and Sexual Activity  . Alcohol use: Yes    Alcohol/week: 8.4 oz    Types: 14 Glasses of wine per week    Comment: daily; a couple of glasses of wine every night   . Drug use: No  . Sexual activity: Not Currently  Lifestyle  . Physical activity:    Days per week: Not on file    Minutes per session: Not on file  . Stress: Not on file  Relationships  . Social connections:    Talks on phone: Not on file    Gets together: Not on file    Attends religious service: Not on file    Active member of club or organization: Not on file    Attends meetings of clubs or organizations: Not on file    Relationship status: Not on file  Other Topics Concern  . Not on file  Social History Narrative   Lives in [place] with spouse in a 2 story home.     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      Family History: The patient's family history includes Atrial fibrillation in his father; Dementia in his maternal grandmother and mother; Hypertension in his father and paternal grandfather; Stroke in his father.  ROS:   Please see the history of present illness.    ROS  All other systems reviewed and negative.   EKGs/Labs/Other Studies Reviewed:    The following studies were reviewed today: PAP download  EKG:  EKG is not ordered today.    Recent Labs: 01/06/2017: Magnesium 2.3; NT-Pro BNP 813 05/13/2017: TSH 1.54 06/16/2017: ALT 27; BUN 15; Creatinine 0.92; Potassium 3.5; Sodium 141 07/12/2017: Hemoglobin 15.6; Platelets 239   Recent Lipid Panel No results found for: CHOL, TRIG, HDL, CHOLHDL, VLDL, LDLCALC, LDLDIRECT  Physical Exam:    VS:  BP 140/76   Pulse 62   Ht 5\' 10"  (1.778 m)   Wt 259 lb (117.5 kg)   SpO2 96%   BMI 37.16 kg/m     Wt Readings from Last 3 Encounters:    08/16/17 259 lb (117.5 kg)  07/21/17 261 lb 3.2 oz (118.5 kg)  07/04/17 257 lb (116.6 kg)     GEN:  Well nourished, well developed in no acute distress HEENT: Normal NECK: No JVD; No carotid bruits LYMPHATICS: No lymphadenopathy CARDIAC: RRR, no murmurs, rubs, gallops RESPIRATORY:  Clear to auscultation without rales, wheezing  or rhonchi  ABDOMEN: Soft, non-tender, non-distended MUSCULOSKELETAL:  No edema; No deformity  SKIN: Warm and dry NEUROLOGIC:  Alert and oriented x 3 PSYCHIATRIC:  Normal affect   ASSESSMENT:    1. OSA (obstructive sleep apnea)   2. Essential hypertension   3. Obesity (BMI 30-39.9)    PLAN:    In order of problems listed above:  1.  OSA - the patient is tolerating PAP therapy well without any problems. The PAP download was reviewed today and showed an AHI of 2.4/hr on 14 cm H2O with 100% compliance in using more than 4 hours nightly.  The patient has been using and benefiting from PAP use and will continue to benefit from therapy. I encouraged him to try Biotene mouth wash and adjust the humidity on his device to see if it helps with his dry mouth.   2.  HTN - BP is well controlled on exam today.  He will continue on amlodipine 10mg  daily, HCTZ 12.5mg  daily and Toprol XL 100mg  daily.    3,.  Obesity - I have encouraged him to get into a routine exercise program and cut back on carbs and portions.     Medication Adjustments/Labs and Tests Ordered: Current medicines are reviewed at length with the patient today.  Concerns regarding medicines are outlined above.  No orders of the defined types were placed in this encounter.  No orders of the defined types were placed in this encounter.   Signed, Fransico Him, MD  08/16/2017 10:09 AM    [place]

## 2017-08-16 NOTE — Patient Instructions (Signed)
Medication Instructions:  Your physician recommends that you continue on your current medications as directed. Please refer to the Current Medication list given to you today.  Labwork: None ordered.  Testing/Procedures: None ordered.  Follow-Up: Your physician wants you to follow-up in: One Year with Dr. Radford Pax. You will receive a reminder letter in the mail two months in advance. If you don't receive a letter, please call our office to schedule the follow-up appointment.   Any Other Special Instructions Will Be Listed Below (If Applicable).     If you need a refill on your cardiac medications before your next appointment, please call your pharmacy.

## 2017-08-17 ENCOUNTER — Other Ambulatory Visit: Payer: Self-pay | Admitting: Surgery

## 2017-08-26 NOTE — Progress Notes (Signed)
Follow-up Visit   Date: 08/29/17    Travis [name] MRN: [id number] DOB: 29-Oct-1950   Interim History: Travis [name] is a 67 y.o. 67 y.o. right-handed Caucasian male with atrial fibrillation s/p ablation (on eliquis), OSA on CPAP, GERD, and hypertension returning to the clinic for follow-up of neuropathy.  The patient was accompanied to the clinic by self.  History of present illness: Starting around 2010, he noticed numbness and tingling over the left foot which over the past year spread into the right.  Symptoms are constant and involve both feet and left lower leg. He denies any weakness or back pain.  He has noticed that his left leg feels heavy after prolonged activity, such as playing golf.  He has not had any falls and denies imbalance.  No identifiable triggers, exacerbating factors, or alleviating factors.  He recalls having history of laser vein surgery over the left thigh about 8 months prior to symptom onset.    He usually drink 1.5 glasses of wine nightly for many years.  No personal history of diabetes or family history of neuropathy.   He had NCS/EMG performed by Dr. Domingo Cocking which was reportedly normal - results will be requested to review.  His diabetes screening has been normal.  He has remote history of vitamin B12 deficiency treated with oral supplementation.  UPDATE 08/29/2017:  He is here for follow-up visit.  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  He has been seeing Dr. 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  Around February, he began having worsening burning and achy pain over the toes.  He does not have imbalance or weakness.  For his pain, gabapentin 200mg  TID was started which has significantly alleviated his pain. His daughter is getting [place] in [place] and he and his wife are travelling to [place] in  August, so he is hoping that his myeloma stays quiet.   Medications:  Current Outpatient Medications on File Prior to Visit  Medication Sig Dispense Refill  . amLODipine (NORVASC) 10 MG tablet Take 10 mg by mouth daily.     [name] b complex vitamins tablet Take 1 tablet by mouth daily.    [name] docusate sodium (COLACE) 100 MG capsule Take 200 mg by mouth at bedtime.    [name] ELIQUIS 5 MG TABS tablet TAKE 1 TABLET (5 MG TOTAL) BY MOUTH TWO TIMES DAILY. 60 tablet 6  . flecainide (TAMBOCOR) 50 MG tablet Take 1 tablet (50 mg total) by mouth 2 (two) times daily. 60 tablet 11  . GABAPENTIN PO Take 200 mg by mouth 3 (three) times daily.    . hydrochlorothiazide (MICROZIDE) 12.5 MG capsule Take 12.5 mg by mouth daily.    [name] lisinopril (PRINIVIL,ZESTRIL) 30 MG tablet Take 30 mg by mouth daily.     . metoprolol succinate (TOPROL-XL) 100 MG 24 hr tablet Take 100 mg by mouth daily. Take with or immediately following a meal.    . Multiple Vitamins-Minerals (MULTIVITAMIN PO) Take 1 tablet by mouth daily.    . Oxymetazoline HCl (NASAL SPRAY NA) Place into the nose as directed.    . psyllium (METAMUCIL) 58.6 % powder Take 1 packet by mouth every evening.      No current facility-administered medications on file prior to visit.     Allergies: No Known Allergies  Review of Systems:  CONSTITUTIONAL: No fevers, chills, night sweats, or weight loss.  EYES: No visual changes or eye pain ENT: No hearing changes.  No history of nose bleeds.   RESPIRATORY: No cough, wheezing and shortness of breath.   CARDIOVASCULAR: Negative for chest pain, and palpitations.   GI: Negative for abdominal discomfort, blood in stools or black stools.  No recent change in bowel habits.   GU:  No history of incontinence.   MUSCLOSKELETAL: No history of joint pain or swelling.  No myalgias.   SKIN: Negative for lesions, rash, and itching.   ENDOCRINE: Negative for cold or heat intolerance, polydipsia or goiter.   PSYCH:  No depression or  anxiety symptoms.   NEURO: As Above.   Vital Signs:  BP 140/80   Pulse 64   Ht 5\' 10"  (1.778 m)   Wt 260 lb 6 oz (118.1 kg)   SpO2 95%   BMI 37.36 kg/m   General Medical Exam:   General:  Well appearing, comfortable  Eyes/ENT: see cranial nerve examination.   Neck:   No carotid bruits. Respiratory:  Clear to auscultation, good air entry bilaterally.   Cardiac:  Regular rate and rhythm, no murmur.   Ext:  No edema  Neurological Exam: MENTAL STATUS including orientation to time, place, person, recent and remote memory, attention span and concentration, language, and fund of knowledge is normal.  Speech is not dysarthric.  CRANIAL NERVES:  Pupils equal round and reactive to light.  Normal conjugate, extra-ocular eye movements in all directions of gaze.  No ptosis.  Face is symmetric. Palate elevates symmetrically.  Tongue is midline.  MOTOR:  Motor strength is 5/5 in all extremities, including distally.  No atrophy, fasciculations or abnormal movements.  No pronator drift.  Tone is normal.    MSRs:  Reflexes are 2+/4 throughout, except absent Achilles bilaterally.  SENSORY:  Absent vibration distal to ankles bilaterally.  Temperature and pin prick is reduced over the toes only.  Rhomberg testing is normal   COORDINATION/GAIT:   Gait narrow based and stable. Tandem gait intact.  Data: Labs 05/13/2017:  SPEP with IFE IGA KAPPA MONOCLONAL PROTEIN, TSH 1.54, MMA 156, folate 20.7, copper 103, vitamin B1 34, vitamin B12 329   IMPRESSION/PLAN: Peripheral neuropathy affecting the toes bilaterally.  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  I appreciate Dr. Clydene Laming thorough evaluation and care. Repeat NCS/EMG of the legs was offered to reassess his neuropathy as it was previously normal when done in 2018 at Dr. Kirstie Mirza office.  Given that the results would not change management, it was mutually decided to reconsider this, unless there is progressive  worsening.  In the meantime, I recommend increasing gabapentin 300mg  twice daily in hopes to avoid taking an afternoon dose as he is sometimes forgetting this.  If he continues to have painful paresthesia, we can certainly increase this to 300mg  TID.  He is planning on stopping alcohol consumption during the week and has started walking to get into better shape for his daughter's upcoming wedding.    Return to clinic in 3 months  Thank you for allowing me to participate in patient's care.  If I can answer any additional questions, I would be pleased to do so.    Sincerely,    Sadeen Wiegel K. Posey Pronto, DO

## 2017-08-29 ENCOUNTER — Encounter: Payer: Self-pay | Admitting: Neurology

## 2017-08-29 ENCOUNTER — Ambulatory Visit (INDEPENDENT_AMBULATORY_CARE_PROVIDER_SITE_OTHER): Payer: 59 | Admitting: Neurology

## 2017-08-29 VITALS — BP 140/80 | HR 64 | Ht 70.0 in | Wt 260.4 lb

## 2017-08-29 DIAGNOSIS — C9000 Multiple myeloma not having achieved remission: Secondary | ICD-10-CM

## 2017-08-29 DIAGNOSIS — G629 Polyneuropathy, unspecified: Secondary | ICD-10-CM

## 2017-08-29 DIAGNOSIS — D472 Monoclonal gammopathy: Secondary | ICD-10-CM

## 2017-08-29 NOTE — Patient Instructions (Addendum)
You can try to adjust your gabapentin to 300mg  twice daily.  You can take an extra dose as needed in the afternoon   If tolerating, please send me a message and I will prescribe 300mg  tablets   Return to clinic in July 24th at 9:30am

## 2017-09-12 ENCOUNTER — Ambulatory Visit (INDEPENDENT_AMBULATORY_CARE_PROVIDER_SITE_OTHER): Payer: 59

## 2017-09-12 ENCOUNTER — Other Ambulatory Visit: Payer: Self-pay | Admitting: Podiatry

## 2017-09-12 ENCOUNTER — Ambulatory Visit (INDEPENDENT_AMBULATORY_CARE_PROVIDER_SITE_OTHER): Payer: 59 | Admitting: Podiatry

## 2017-09-12 ENCOUNTER — Encounter: Payer: Self-pay | Admitting: Podiatry

## 2017-09-12 VITALS — BP 138/77 | HR 56 | Resp 16

## 2017-09-12 DIAGNOSIS — M79672 Pain in left foot: Secondary | ICD-10-CM | POA: Diagnosis not present

## 2017-09-12 DIAGNOSIS — E349 Endocrine disorder, unspecified: Secondary | ICD-10-CM

## 2017-09-12 DIAGNOSIS — M79671 Pain in right foot: Secondary | ICD-10-CM

## 2017-09-12 DIAGNOSIS — G63 Polyneuropathy in diseases classified elsewhere: Secondary | ICD-10-CM

## 2017-09-12 DIAGNOSIS — B351 Tinea unguium: Secondary | ICD-10-CM

## 2017-09-12 NOTE — Progress Notes (Signed)
   Subjective:    Patient ID: Travis [name], male    DOB: Dec 17, 1950, 67 y.o.   MRN: [id number]  HPI    Review of Systems  All other systems reviewed and are negative.      Objective:   Physical Exam        Assessment & Plan:

## 2017-09-13 ENCOUNTER — Inpatient Hospital Stay: Payer: PRIVATE HEALTH INSURANCE | Attending: Hematology and Oncology

## 2017-09-13 DIAGNOSIS — D472 Monoclonal gammopathy: Secondary | ICD-10-CM | POA: Diagnosis present

## 2017-09-13 DIAGNOSIS — G629 Polyneuropathy, unspecified: Secondary | ICD-10-CM | POA: Insufficient documentation

## 2017-09-13 DIAGNOSIS — C9000 Multiple myeloma not having achieved remission: Secondary | ICD-10-CM | POA: Insufficient documentation

## 2017-09-13 LAB — CMP (CANCER CENTER ONLY)
ALT: 39 U/L (ref 0–55)
ANION GAP: 9 (ref 3–11)
AST: 28 U/L (ref 5–34)
Albumin: 3.8 g/dL (ref 3.5–5.0)
Alkaline Phosphatase: 98 U/L (ref 40–150)
BUN: 15 mg/dL (ref 7–26)
CHLORIDE: 104 mmol/L (ref 98–109)
CO2: 27 mmol/L (ref 22–29)
Calcium: 9.3 mg/dL (ref 8.4–10.4)
Creatinine: 0.91 mg/dL (ref 0.70–1.30)
GFR, Estimated: >60 mL/min (ref >60)
Glucose, Bld: 108 mg/dL (ref 70–140)
Potassium: 3.7 mmol/L (ref 3.5–5.1)
SODIUM: 140 mmol/L (ref 136–145)
Total Bilirubin: 0.4 mg/dL (ref 0.2–1.2)
Total Protein: 6.8 g/dL (ref 6.4–8.3)

## 2017-09-13 LAB — CBC WITH DIFFERENTIAL (CANCER CENTER ONLY)
BASOS PCT: 0 %
Basophils Absolute: 0 10*3/uL (ref 0.0–0.1)
EOS ABS: 0.1 10*3/uL (ref 0.0–0.5)
Eosinophils Relative: 2 %
HCT: 42.4 % (ref 38.4–49.9)
Hemoglobin: 14.6 g/dL (ref 13.0–17.1)
Lymphocytes Relative: 17 %
Lymphs Abs: 1.3 10*3/uL (ref 0.9–3.3)
MCH: 32.4 pg (ref 27.2–33.4)
MCHC: 34.4 g/dL (ref 32.0–36.0)
MCV: 94 fL (ref 79.3–98.0)
MONOS PCT: 15 %
Monocytes Absolute: 1.1 10*3/uL — ABNORMAL HIGH (ref 0.1–0.9)
Neutro Abs: 5.1 10*3/uL (ref 1.5–6.5)
Neutrophils Relative %: 66 %
Platelet Count: 208 10*3/uL (ref 140–400)
RBC: 4.51 MIL/uL (ref 4.20–5.82)
RDW: 13.3 % (ref 11.0–14.6)
WBC Count: 7.6 10*3/uL (ref 4.0–10.3)

## 2017-09-13 LAB — RETICULOCYTES
RBC.: 4.51 MIL/uL (ref 4.20–5.82)
RETIC CT PCT: 1.8 % (ref 0.8–1.8)
Retic Count, Absolute: 81.2 10*3/uL (ref 34.8–93.9)

## 2017-09-13 LAB — LACTATE DEHYDROGENASE: LDH: 184 U/L (ref 125–245)

## 2017-09-14 LAB — KAPPA/LAMBDA LIGHT CHAINS
KAPPA FREE LGHT CHN: 119.3 mg/L — AB (ref 3.3–19.4)
KAPPA, LAMDA LIGHT CHAIN RATIO: 14.04 — AB (ref 0.26–1.65)
LAMDA FREE LIGHT CHAINS: 8.5 mg/L (ref 5.7–26.3)

## 2017-09-14 LAB — IGG, IGA, IGM
IGA: 544 mg/dL — AB (ref 61–437)
IGM (IMMUNOGLOBULIN M), SRM: 29 mg/dL (ref 20–172)
IgG (Immunoglobin G), Serum: 519 mg/dL — ABNORMAL LOW (ref 700–1600)

## 2017-09-14 LAB — BETA 2 MICROGLOBULIN, SERUM: Beta-2 Microglobulin: 1.4 mg/L (ref 0.6–2.4)

## 2017-09-14 NOTE — Progress Notes (Addendum)
Subjective:   Patient ID: Travis [name], male   DOB: 67 y.o.   MRN: [id number]   HPI Patient presents stating he has had discomfort on the top bottom of his feet of low-grade nature and does not remember specific injury but does have high protein with smoldering multiple myeloma.  Patient does not smoke and likes to be active.  Patient does have neuropathic changes which are present bilateral.  Patient does have diminishment of sharp dull vibratory   Review of Systems  All other systems reviewed and are negative.       Objective:  Physical Exam  Constitutional: He appears well-developed and well-nourished.  Cardiovascular: Intact distal pulses.  Pulmonary/Chest: Effort normal.  Musculoskeletal: Normal range of motion.  Neurological: He is alert.  Skin: Skin is warm.  Nursing note and vitals reviewed.   Neurovascular status was found to be intact muscle strength is adequate range of motion within normal limits with patient noted on the feet to have discomfort dorsal plantar but localized in nature with no specific area of intense discomfort.  Patient was found to have good digital perfusion well oriented x3     Assessment:  Low-grade tendinitis with also mycotic nail infection and symptoms which may be due to endocrine disorder.  Patient also has neuropathy which is becoming more symptomatic     Plan:  H&P x-rays reviewed and recommended increasing gabapentin at nighttime and using heat therapy but first checking it to make sure is not too hot.  Patient will be seen back to check.  I have recommended treatment with neurogenic's to try to see if we can reduce the neuropathic symptoms and changes this patient has experienced  X-ray indicates that there is mild arthritis but no indication of stress fracture or advanced condition

## 2017-09-15 LAB — PROTEIN ELECTROPHORESIS, SERUM
A/G Ratio: 1.3 (ref 0.7–1.7)
Albumin ELP: 3.5 g/dL (ref 2.9–4.4)
Alpha-1-Globulin: 0.2 g/dL (ref 0.0–0.4)
Alpha-2-Globulin: 0.6 g/dL (ref 0.4–1.0)
Beta Globulin: 1.4 g/dL — ABNORMAL HIGH (ref 0.7–1.3)
Gamma Globulin: 0.4 g/dL (ref 0.4–1.8)
Globulin, Total: 2.6 g/dL (ref 2.2–3.9)
M-Spike, %: 0.5 g/dL — ABNORMAL HIGH
Total Protein ELP: 6.1 g/dL (ref 6.0–8.5)

## 2017-09-19 DIAGNOSIS — C9000 Multiple myeloma not having achieved remission: Secondary | ICD-10-CM | POA: Diagnosis not present

## 2017-09-20 ENCOUNTER — Telehealth: Payer: Self-pay

## 2017-09-20 ENCOUNTER — Encounter: Payer: Self-pay | Admitting: Hematology and Oncology

## 2017-09-20 ENCOUNTER — Inpatient Hospital Stay (HOSPITAL_BASED_OUTPATIENT_CLINIC_OR_DEPARTMENT_OTHER): Payer: PRIVATE HEALTH INSURANCE | Admitting: Hematology and Oncology

## 2017-09-20 VITALS — BP 146/66 | HR 62 | Temp 98.4°F | Resp 18 | Ht 70.0 in | Wt 262.0 lb

## 2017-09-20 DIAGNOSIS — C9000 Multiple myeloma not having achieved remission: Secondary | ICD-10-CM | POA: Diagnosis not present

## 2017-09-20 DIAGNOSIS — D472 Monoclonal gammopathy: Secondary | ICD-10-CM

## 2017-09-20 DIAGNOSIS — G629 Polyneuropathy, unspecified: Secondary | ICD-10-CM

## 2017-09-20 NOTE — Telephone Encounter (Signed)
Printed avs and calender of upcoming appointment. Per 5/21

## 2017-09-23 ENCOUNTER — Ambulatory Visit: Payer: 59 | Admitting: Neurology

## 2017-09-23 ENCOUNTER — Encounter

## 2017-09-27 ENCOUNTER — Telehealth: Payer: Self-pay

## 2017-09-27 ENCOUNTER — Other Ambulatory Visit: Payer: Self-pay | Admitting: Hematology and Oncology

## 2017-09-27 DIAGNOSIS — M545 Low back pain, unspecified: Secondary | ICD-10-CM

## 2017-09-27 DIAGNOSIS — C9000 Multiple myeloma not having achieved remission: Secondary | ICD-10-CM

## 2017-09-27 DIAGNOSIS — D472 Monoclonal gammopathy: Secondary | ICD-10-CM

## 2017-09-27 NOTE — Telephone Encounter (Signed)
Patient returned phone call and was able to speak with Dr. Lebron Conners to get his questions answered.

## 2017-09-27 NOTE — Telephone Encounter (Signed)
Patient called the office and left voicemail to return his call. Returned phone call to patient and no answer. Left voicemail for patient to call the office back if he still needs to speak to someone.

## 2017-09-29 LAB — UPEP/UIFE/LIGHT CHAINS/TP, 24-HR UR
% BETA, Urine: 58.2 %
ALBUMIN, U: 16.2 %
ALPHA 1 URINE: 3.3 %
Alpha 2, Urine: 12.3 %
Free Kappa Lt Chains,Ur: 1780 mg/L — ABNORMAL HIGH (ref 1.35–24.19)
Free Kappa/Lambda Ratio: 150.85 — ABNORMAL HIGH (ref 2.04–10.37)
Free Lambda Lt Chains,Ur: 11.8 mg/L — ABNORMAL HIGH (ref 0.24–6.66)
GAMMA GLOBULIN URINE: 10 %
M-SPIKE %, URINE: 42.6 % — AB
M-Spike, Mg/24 Hr: 121 mg/24 hr — ABNORMAL HIGH
Total Protein, Urine-Ur/day: 283 mg/24 hr — ABNORMAL HIGH (ref 30–150)
Total Protein, Urine: 28.3 mg/dL
Total Volume: 1000

## 2017-10-02 NOTE — Progress Notes (Signed)
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  Subsequent evaluation with bone marrow biopsy demonstrates presence of multiple myeloma based on 12% involvement of the bone marrow with monoclonal plasma cell population. Based on CRAB criteria, patient qualifies for smoldering multiple myeloma --he has no hypercalcemia, creatinine elevation, lytic skeletal lesions, high percentage of bone marrow involvement with cytopenias, or high dysproteinemia.  Neuropathy we will not be attributable to the multiple myeloma at this point in time.  Additional assessment to determine whether patient would benefit from therapy initiation would involve PET/CT, fat pad biopsy for amyloidosis and echocardiogram.  Patient's insurance has denied PET/CT despite updated diagnosis of multiple myeloma presented to them in peer to peer.  This goes against the current standard as outlined in the NCCN guidelines which recommends confirmatory high-resolution testing such as whole body MRI or PET/CT in cases of negative skeletal survey.  I have discussed the potential cost with the patient and he does not believe that he would like to have a financial risk of having to pay for the PET scan out of the pocket if insurance does not grant approval.  Interval assessment with labs demonstrates no definitive evidence of disease progression although patient does have progressive neuropathy.  At this time, observation appears to be adequate  Plan: -Continue observation -Return to clinic in 2 months with full multiple myeloma labs, skeletal survey and clinic visit for continued monitoring  Voice recognition software was used and creation of this note. Despite my best effort at editing the text, some misspelling/errors may have occurred.  Orders Placed This Encounter  Procedures  . DG  Bone Survey Met    Standing Status:   Future    Standing Expiration Date:   11/21/2018    Order Specific Question:   Reason for Exam (SYMPTOM  OR DIAGNOSIS REQUIRED)    Answer:   Smoldering myeloma, please eval for lytic lesions    Order Specific Question:   Preferred imaging location?    Answer:   [facility]    Order Specific Question:   Radiology Contrast Protocol - do NOT remove file path    Answer:   _[email or web address]  . CBC with Differential ([facility] Only)    Standing Status:   Future    Standing Expiration Date:   09/21/2018  . CMP ([facility] only)    Standing Status:   Future    Standing Expiration Date:   09/21/2018  . Lactate dehydrogenase (LDH)    Standing Status:   Future    Standing Expiration Date:   09/20/2018  . Beta 2 microglobulin, serum    Standing Status:   Future    Standing Expiration Date:   09/21/2018  . Multiple Myeloma Panel (SPEP&IFE w/QIG)    Standing Status:   Future    Standing Expiration Date:   09/20/2018  . Viscosity, serum    Standing Status:   Future    Standing Expiration Date:   09/20/2018  . Kappa/lambda light chains    Standing Status:   Future    Standing Expiration Date:   09/20/2018  . 24 Hr Ur UIFE/Light Chains/TP QN    Standing Status:   Future    Standing Expiration Date:   09/21/2018    Cancer Staging No matching staging information was found for the patient.  All questions were answered.  . The patient  knows to call the clinic with any problems, questions or concerns.  This note was electronically signed.    History of Presenting Illness Travis [name] is a 67 y.o. male followed in the [facility] for monoclonal gammopathy of unknown significance in the setting of peripheral neuropathy, referred by Dr Alda Berthold.  Patient's past medical history is significant for arthritis, history of thrombophlebitis in the left lower extremity 32 years ago, GERD, obesity,  obstructive sleep apnea, history of atrial fibrillation currently treated with apixaban.  Patient has developed bilateral lower extremity peripheral neuropathy after undergoing laser surgery procedure for varicose veins in the lower extremities in 2010.  Neuropathy remains reasonably stable since that time without noticeable progression based on the patient's report.  Recently, due to persistent neuropathy he was undergoing evaluation which included the lab as outlined below.  At the present time, patient denies any fevers, chills, night sweats.  No unexpected weight loss.  Denies any new or progressive discomfort in the skeletal structures.  Patient does report progressive neuropathy in both hands and feet.  Currently, gabapentin appears to be sufficiently helpful.  Oncological/hematological History: **Smoldering Multiple Myeloma: --Labs, 05/13/17: SPEP -- positive for "abnormal" protein, 0.7g/dL; IgG 558, IgA 562, IgM 37 --Labs, 06/16/17: tProt 6.8, Alb 3.7, Ca 9.1, Cr 0.9, AP 104, LDH 159, beta-2 microglobulin 1.4; SPEP -- MSpike 0.4g/dL, SIFE IgA kappa; IgG 592, IgA 558, IgM 34; kappa 99.8, lambda 9.0, KLR 11.09; UPEP -- MSpike 77m/24hrs, UIFE -- kappa light chains; u-Kappa 543, u-Lambda 192, u-KLR 2.83; --BM Bx, 07/12/17: Hypercellular Bone marrow with 12% monoclonal plasma cell involvement. Cytogenetics and FISH pending    [place]oldering [place]oma ([place]   07/21/2017 Initial Diagnosis    [place] ([place]      09/13/2017 Tumor Marker    tProt 6.8, Alb 3.8, Ca 9.3, Cr 0.9, AP 98 LDH 184, beta-2 microglobulin 1.4 SPEP -- M-spike 0.5g/dL, SIFE -- IgA kappa IgG 519, IgA 544, IgM 29; kappa 119.3, lambda 8.5, KLR 14.04 WBC 7.6, Hgb 14.6, Plt 208       Medical History: Past Medical History:  Diagnosis Date  . Arthritis    "comes and goes; mostly in hands, occasionally elbow" (04/05/2017)  . Complication of anesthesia    "just wanted to sleep alot  for couple days S/P  VARICOCELE OR" (04/05/2017)  . DVT (deep venous thrombosis) (HCC)    LLE  . Dysrhythmia    PVCs   . GERD (gastroesophageal reflux disease)   . History of hiatal hernia   . Hypertension   . Impaired glucose tolerance   . Obesity (BMI 30-39.9) 07/26/2016  . OSA on CPAP 07/26/2016   Mild with AHI 12.5/hr now on CPAP at 14cm H2O  . Paroxysmal atrial fibrillation (HCC)   . Pneumonia    "may have had walking pneumonia a few years ago" (04/05/2017)  . Thrombophlebitis     Surgical History: Past Surgical History:  Procedure Laterality Date  . ATRIAL FIBRILLATION ABLATION  04/05/2017  . ATRIAL FIBRILLATION ABLATION N/A 04/05/2017   Procedure: ATRIAL FIBRILLATION ABLATION;  Surgeon: AThompson Grayer MD;  [facility];  Service: Cardiovascular;  Laterality: N/A;  . EVALUATION UNDER ANESTHESIA WITH HEMORRHOIDECTOMY N/A 02/24/2017   Procedure: EXAM UNDER ANESTHESIA WITH  HEMORRHOIDECTOMY;  Surgeon: GMichael Boston MD;  Location: [facility];  Service: General;  Laterality: N/A;  . FLEXIBLE SIGMOIDOSCOPY N/A 04/15/2015   Procedure:  UNSEDATED FLEXIBLE SIGMOIDOSCOPY;  Surgeon: MGarlan Fair MD;  [facility];  Service: Endoscopy;  Laterality: N/A;  . KNEE ARTHROSCOPY Left ~ 2016  . 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 TONSILLECTOMY    . VARICOSE VEIN SURGERY Bilateral     Family History: Family History  Problem Relation Age of Onset  . Dementia Mother   . Stroke Father   . Atrial fibrillation Father   . Hypertension Father   . Hypertension Paternal Grandfather   . Dementia Maternal Grandmother     Social History: Social History   Socioeconomic History  . Marital status: Married    Spouse name: Not on file  . Number of children: 1  . Years of education: law school  . Highest education level: Not on file  Occupational History  . Occupation: Chief Executive Officer  Social Needs  . Financial resource strain: Not on file  . Food insecurity:    Worry: Not on file     Inability: Not on file  . Transportation needs:    Medical: Not on file    Non-medical: Not on file  Tobacco Use  . Smoking status: Never Smoker  . Smokeless tobacco: Never Used  Substance and Sexual Activity  . Alcohol use: Yes    Alcohol/week: 8.4 oz    Types: 14 Glasses of wine per week    Comment: daily; a couple of glasses of wine every night   . Drug use: No  . Sexual activity: Not Currently  Lifestyle  . Physical activity:    Days per week: Not on file    Minutes per session: Not on file  . Stress: Not on file  Relationships  . Social connections:    Talks on phone: Not on file    Gets together: Not on file    Attends religious service: Not on file    Active member of club or organization: Not on file    Attends meetings of clubs or organizations: Not on file    Relationship status: Not on file  . Intimate partner violence:    Fear of current or ex partner: Not on file    Emotionally abused: Not on file    Physically abused: Not on file    Forced sexual activity: Not on file  Other Topics Concern  . Not on file  Social History Narrative   Lives in [place] with spouse in a 2 story home.     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     Allergies: No Known Allergies  Medications:  Current Outpatient Medications  Medication Sig Dispense Refill  . amLODipine (NORVASC) 10 MG tablet Take 10 mg by mouth daily.     [name] b complex vitamins tablet Take 1 tablet by mouth daily.    [name] docusate sodium (COLACE) 100 MG capsule Take 200 mg by mouth at bedtime.    [name] ELIQUIS 5 MG TABS tablet TAKE 1 TABLET (5 MG TOTAL) BY MOUTH TWO TIMES DAILY. 60 tablet 6  . flecainide (TAMBOCOR) 50 MG tablet Take 1 tablet (50 mg total) by mouth 2 (two) times daily. 60 tablet 11  . GABAPENTIN PO Take 300 mg by mouth 2 (two) times daily.     . hydrochlorothiazide (MICROZIDE) 12.5 MG capsule Take 12.5 mg by mouth daily.    [name] lisinopril (PRINIVIL,ZESTRIL) 30 MG  tablet Take 30 mg by mouth daily.     . metoprolol succinate (TOPROL-XL) 100 MG 24 hr tablet Take 100 mg by mouth daily.  Take with or immediately following a meal.    . Multiple Vitamins-Minerals (MULTIVITAMIN PO) Take 1 tablet by mouth daily.    . Oxymetazoline HCl (NASAL SPRAY NA) Place into the nose as directed.    . psyllium (METAMUCIL) 58.6 % powder Take 1 packet by mouth every evening.      No current facility-administered medications for this visit.     Review of Systems: Review of Systems  Neurological: Positive for numbness.  All other systems reviewed and are negative.    PHYSICAL EXAMINATION Blood pressure (!) 146/66, pulse 62, temperature 98.4 F (36.9 C), temperature source Oral, resp. rate 18, height 5' 10" (1.778 m), weight 262 lb (118.8 kg), SpO2 97 %.  ECOG PERFORMANCE STATUS: 1 - Symptomatic but completely ambulatory  Physical Exam  Constitutional: He is oriented to person, place, and time and well-developed, well-nourished, and in no distress. No distress.  HENT:  Head: Normocephalic and atraumatic.  Mouth/Throat: Oropharynx is clear and moist. No oropharyngeal exudate.  Eyes: Pupils are equal, round, and reactive to light. Conjunctivae and EOM are normal. No scleral icterus.  Neck: No thyromegaly present.  Cardiovascular: Normal rate, regular rhythm and normal heart sounds.  No murmur heard. Pulmonary/Chest: Breath sounds normal. No respiratory distress. He has no wheezes. He has no rales.  Abdominal: Soft. Bowel sounds are normal. He exhibits no distension and no mass. There is no tenderness. There is no guarding.  Musculoskeletal: He exhibits no edema.  Lymphadenopathy:    He has no cervical adenopathy.  Neurological: He is alert and oriented to person, place, and time. He has normal reflexes. No cranial nerve deficit.  Skin: Skin is warm and dry. No rash noted. He is not diaphoretic. No erythema. No pallor.     LABORATORY DATA: I have personally  reviewed the data as listed: No visits with results within 1 Week(s) from this visit.  Latest known visit with results is:  Clinical Support on 09/13/2017  Component Date Value Ref Range Status  . Sodium 09/13/2017 140  136 - 145 mmol/L Final  . Potassium 09/13/2017 3.7  3.5 - 5.1 mmol/L Final  . Chloride 09/13/2017 104  98 - 109 mmol/L Final  . CO2 09/13/2017 27  22 - 29 mmol/L Final  . Glucose, Bld 09/13/2017 108  70 - 140 mg/dL Final  . BUN 09/13/2017 15  7 - 26 mg/dL Final  . Creatinine 09/13/2017 0.91  0.70 - 1.30 mg/dL Final  . Calcium 09/13/2017 9.3  8.4 - 10.4 mg/dL Final  . Total Protein 09/13/2017 6.8  6.4 - 8.3 g/dL Final  . Albumin 09/13/2017 3.8  3.5 - 5.0 g/dL Final  . AST 09/13/2017 28  5 - 34 U/L Final  . ALT 09/13/2017 39  0 - 55 U/L Final  . Alkaline Phosphatase 09/13/2017 98  40 - 150 U/L Final  . Total Bilirubin 09/13/2017 0.4  0.2 - 1.2 mg/dL Final  . GFR, Est Non Af Am 09/13/2017 >60  >60 mL/min Final  . GFR, Est AFR Am 09/13/2017 >60  >60 mL/min Final   Comment: (NOTE) The eGFR has been calculated using the CKD EPI equation. This calculation has not been validated in all clinical situations. eGFR's persistently <60 mL/min signify possible Chronic Kidney Disease.   Georgiann Hahn gap 09/13/2017 9  3 - 11 Final   Performed at [facility], [place] [address], [place], [place] [address]  . Beta-2 Microglobulin 09/13/2017 1.4  0.6 - 2.4 mg/L Final  Comment: (NOTE) Siemens Immulite 2000 Immunochemiluminometric assay (ICMA) Values obtained with different assay methods or kits cannot be used interchangeably. Results cannot be interpreted as absolute evidence of the presence or absence of malignant disease. Performed At: [facility] [place], [place] [id number] Rush Farmer MD [id number] Performed at [facility], [place] [address], [place], [place] [address]   . Kappa free light chain 09/13/2017  119.3* 3.3 - 19.4 mg/L Final  . Lamda free light chains 09/13/2017 8.5  5.7 - 26.3 mg/L Final  . Kappa, lamda light chain ratio 09/13/2017 14.04* 0.26 - 1.65 Final   Comment: (NOTE) Performed At: [facility] [place], [place] [id number] Rush Farmer MD [id number] Performed at [facility], [place] [address], [place], [place] [address]   . LDH 09/13/2017 184  125 - 245 U/L Final   Performed at [facility], [place] [address] [place], [place] [address]  . WBC Count 09/13/2017 7.6  4.0 - 10.3 K/uL Final  . RBC 09/13/2017 4.51  4.20 - 5.82 MIL/uL Final  . Hemoglobin 09/13/2017 14.6  13.0 - 17.1 g/dL Final  . HCT 09/13/2017 42.4  38.4 - 49.9 % Final  . MCV 09/13/2017 94.0  79.3 - 98.0 fL Final  . MCH 09/13/2017 32.4  27.2 - 33.4 pg Final  . MCHC 09/13/2017 34.4  32.0 - 36.0 g/dL Final  . RDW 09/13/2017 13.3  11.0 - 14.6 % Final  . Platelet Count 09/13/2017 208  140 - 400 K/uL Final  . Neutrophils Relative % 09/13/2017 66  % Final  . Neutro Abs 09/13/2017 5.1  1.5 - 6.5 K/uL Final  . Lymphocytes Relative 09/13/2017 17  % Final  . Lymphs Abs 09/13/2017 1.3  0.9 - 3.3 K/uL Final  . Monocytes Relative 09/13/2017 15  % Final  . Monocytes Absolute 09/13/2017 1.1* 0.1 - 0.9 K/uL Final  . Eosinophils Relative 09/13/2017 2  % Final  . Eosinophils Absolute 09/13/2017 0.1  0.0 - 0.5 K/uL Final  . Basophils Relative 09/13/2017 0  % Final  . Basophils Absolute 09/13/2017 0.0  0.0 - 0.1 K/uL Final   Performed at [facility], [place] [address], [place], [place] [address]  . IgG (Immunoglobin G), Serum 09/13/2017 519* 700 - 1,600 mg/dL Final  . IgA 09/13/2017 544* 61 - 437 mg/dL Final  . IgM (Immunoglobulin M), Srm 09/13/2017 29  20 - 172 mg/dL Final   Comment: (NOTE) Performed At: [facility] [place], [place] [id number] Rush Farmer MD [id number] Performed at [facility], [place] [address] [place], [place] [address]   . Retic Ct Pct 09/13/2017 1.8  0.8 - 1.8 % Final  . RBC. 09/13/2017 4.51  4.20 - 5.82 MIL/uL Final  . Retic Count, Absolute 09/13/2017 81.2  34.8 - 93.9 K/uL Final   Performed at [facility], [place] [address], [place], [place] [address]  . Total Protein ELP 09/13/2017 6.1  6.0 - 8.5 g/dL Final  . Albumin ELP 09/13/2017 3.5  2.9 - 4.4 g/dL Final  . Alpha-1-Globulin 09/13/2017 0.2  0.0 - 0.4 g/dL Final  . Alpha-2-Globulin 09/13/2017 0.6  0.4 - 1.0 g/dL Final  . Beta Globulin 09/13/2017 1.4* 0.7 - 1.3 g/dL Final  . Gamma Globulin 09/13/2017 0.4  0.4 - 1.8 g/dL Final  . M-Spike, % 09/13/2017 0.5* Not Observed g/dL Final  . SPE Interp. 09/13/2017 Comment  Final   Comment: (NOTE) The SPE pattern demonstrates a single peak (M-spike) in the beta region which may represent monoclonal protein. This peak may also be caused by the presence of fibrinogen or circulating immune complexes. If clinically indicated, the presence of a monoclonal gammopathy may be confirmed by immunofixation, as well as evaluation of the urine for the presence of Bence-Jones protein. Performed At: [facility] [place], [place] [id number] Rush Farmer MD [id number]   . Comment 09/13/2017 Comment   Final   Comment: (NOTE) Protein electrophoresis scan will follow via computer, mail, or courier delivery.   [name] GLOBULIN, TOTAL 09/13/2017 2.6  2.2 - 3.9 g/dL Corrected  . A/G Ratio 09/13/2017 1.3  0.7 - 1.7 Corrected   Performed at [facility], [place] [address], [place], [place] [address]  . Total Protein, Urine 09/19/2017 28.3  Not Estab. mg/dL Final  . Total Protein, Urine-Ur/day 09/19/2017 283* 30 - 150 mg/24 hr Final  . Albumin, U 09/19/2017 16.2  % Final  . ALPHA 1 URINE 09/19/2017 3.3  % Final  . Alpha 2, Urine 09/19/2017 12.3  % Final  . % BETA, Urine 09/19/2017 58.2  %  Final  . GAMMA GLOBULIN URINE 09/19/2017 10.0  % Final  . Free Kappa Lt Chains,Ur 09/19/2017 1,780.00* 1.35 - 24.19 mg/L Final   **Results verified by repeat testing**  . Free Lambda Lt Chains,Ur 09/19/2017 11.80* 0.24 - 6.66 mg/L Corrected  . Free Kappa/Lambda Ratio 09/19/2017 150.85* 2.04 - 10.37 Corrected   Comment: (NOTE) Performed At: [facility] [address] [place], [place] [id number] Rush Farmer MD AT:[id number]   . Immunofixation Result, Urine 09/19/2017 Comment   Corrected   Comment: (NOTE) Bence Jones Protein positive; kappa type. Immunofixation shows IgA monoclonal protein with kappa light chain specificity.   . Total Volume 09/19/2017 1,000   Final  . M-SPIKE %, Urine 09/19/2017 42.6* Not Observed % Corrected  . M-Spike, Mg/24 Hr 09/19/2017 121* Not Observed mg/24 hr Corrected  . Note: 09/19/2017 Comment   Corrected   Comment: (NOTE) Protein electrophoresis scan will follow via computer, mail, or courier delivery. Performed at [facility], [place] [address], [place], [place] [address]        Ardath Sax, MD

## 2017-10-02 NOTE — Assessment & Plan Note (Signed)
67 y.o. 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  Subsequent evaluation with bone marrow biopsy demonstrates presence of multiple myeloma based on 12% involvement of the bone marrow with monoclonal plasma cell population. Based on CRAB criteria, patient qualifies for smoldering multiple myeloma --he has no hypercalcemia, creatinine elevation, lytic skeletal lesions, high percentage of bone marrow involvement with cytopenias, or high dysproteinemia.  Neuropathy we will not be attributable to the multiple myeloma at this point in time.  Additional assessment to determine whether patient would benefit from therapy initiation would involve PET/CT, fat pad biopsy for amyloidosis and echocardiogram.  Patient's insurance has denied PET/CT despite updated diagnosis of multiple myeloma presented to them in peer to peer.  This goes against the current standard as outlined in the NCCN guidelines which recommends confirmatory high-resolution testing such as whole body MRI or PET/CT in cases of negative skeletal survey.  I have discussed the potential cost with the patient and he does not believe that he would like to have a financial risk of having to pay for the PET scan out of the pocket if insurance does not grant approval.  Interval assessment with labs demonstrates no definitive evidence of disease progression although patient does have progressive neuropathy.  At this time, observation appears to be adequate  Plan: -Continue observation -Return to clinic in 2 months with full multiple myeloma labs, skeletal survey and clinic visit for continued monitoring

## 2017-10-03 ENCOUNTER — Telehealth: Payer: Self-pay

## 2017-10-03 NOTE — Telephone Encounter (Signed)
Pt left VM - said he knows it is short notice but his insurance prefers him to have MRI at [facility]- less expensive-  Needs [facility] MRI canceled for tomorrow and needs appt at [facility]- I called pt and let him know I will call him back tomorrow after new order for [facility] is placed or faxed over and then make appt for him there- pt voiced understanding. He would like to see Dr Lindi Adie to discuss results and his pain once he has his MRI done, so he will need appt as well.

## 2017-10-04 ENCOUNTER — Ambulatory Visit (HOSPITAL_COMMUNITY): Admission: RE | Admit: 2017-10-04 | Payer: PRIVATE HEALTH INSURANCE | Source: Ambulatory Visit

## 2017-10-04 ENCOUNTER — Encounter (HOSPITAL_COMMUNITY): Payer: Self-pay

## 2017-10-04 ENCOUNTER — Other Ambulatory Visit: Payer: Self-pay

## 2017-10-04 ENCOUNTER — Telehealth: Payer: Self-pay

## 2017-10-04 DIAGNOSIS — C9000 Multiple myeloma not having achieved remission: Secondary | ICD-10-CM

## 2017-10-04 DIAGNOSIS — D472 Monoclonal gammopathy: Secondary | ICD-10-CM

## 2017-10-04 NOTE — Telephone Encounter (Signed)
Informed pt order was faxed to [facility] and he can call to schedule.  Cyndia Bent RN

## 2017-10-04 NOTE — Progress Notes (Signed)
New order placed for MR lumbar spine and faxed to [facility] per patient request.

## 2017-10-05 ENCOUNTER — Encounter: Payer: Self-pay | Admitting: Internal Medicine

## 2017-10-05 ENCOUNTER — Ambulatory Visit (INDEPENDENT_AMBULATORY_CARE_PROVIDER_SITE_OTHER): Payer: 59 | Admitting: Internal Medicine

## 2017-10-05 VITALS — BP 140/90 | HR 60 | Ht 70.0 in | Wt 261.0 lb

## 2017-10-05 DIAGNOSIS — G4733 Obstructive sleep apnea (adult) (pediatric): Secondary | ICD-10-CM | POA: Diagnosis not present

## 2017-10-05 DIAGNOSIS — I10 Essential (primary) hypertension: Secondary | ICD-10-CM

## 2017-10-05 DIAGNOSIS — I480 Paroxysmal atrial fibrillation: Secondary | ICD-10-CM

## 2017-10-05 NOTE — Progress Notes (Signed)
PCP: Lajean Manes, MD Primary Cardiologist: Dr Harrington Challenger Primary EP: Dr Rayann Heman  Travis [name] is a 67 y.o. male who presents today for routine electrophysiology followup.  Since last being seen in our clinic, the patient reports doing very well. He has been diagnosed with smoldering myeloma but appears to be doing ok with this.  Rare palpitations. Drinks too much, overweight, not exercising.  He is using his CPAP.  Today, he denies symptoms of palpitations, chest pain, shortness of breath,  lower extremity edema, dizziness, presyncope, or syncope.  The patient is otherwise without complaint today.   Past Medical History:  Diagnosis Date  . Arthritis    "comes and goes; mostly in hands, occasionally elbow" (04/05/2017)  . Complication of anesthesia    "just wanted to sleep alot  for couple days S/P VARICOCELE OR" (04/05/2017)  . DVT (deep venous thrombosis) (HCC)    LLE  . Dysrhythmia    PVCs   . GERD (gastroesophageal reflux disease)   . History of hiatal hernia   . Hypertension   . Impaired glucose tolerance   . Obesity (BMI 30-39.9) 07/26/2016  . OSA on CPAP 07/26/2016   Mild with AHI 12.5/hr now on CPAP at 14cm H2O  . Paroxysmal atrial fibrillation (HCC)   . Pneumonia    "may have had walking pneumonia a few years ago" (04/05/2017)  . Thrombophlebitis    Past Surgical History:  Procedure Laterality Date  . ATRIAL FIBRILLATION ABLATION  04/05/2017  . ATRIAL FIBRILLATION ABLATION N/A 04/05/2017   Procedure: ATRIAL FIBRILLATION ABLATION;  Surgeon: Thompson Grayer, MD;  Location: [facility];  Service: Cardiovascular;  Laterality: N/A;  . EVALUATION UNDER ANESTHESIA WITH HEMORRHOIDECTOMY N/A 02/24/2017   Procedure: EXAM UNDER ANESTHESIA WITH  HEMORRHOIDECTOMY;  Surgeon: Michael Boston, MD;  Location: [facility];  Service: General;  Laterality: N/A;  . FLEXIBLE SIGMOIDOSCOPY N/A 04/15/2015   Procedure:  UNSEDATED FLEXIBLE SIGMOIDOSCOPY;  Surgeon: [name], MD;  Location:  [facility];  Service: Endoscopy;  Laterality: N/A;  . KNEE ARTHROSCOPY Left ~ 2016  . 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 TONSILLECTOMY    . VARICOSE VEIN SURGERY Bilateral     ROS- all systems are reviewed and negatives except as per HPI above  Current Outpatient Medications  Medication Sig Dispense Refill  . amLODipine (NORVASC) 10 MG tablet Take 10 mg by mouth daily.     [name] b complex vitamins tablet Take 1 tablet by mouth daily.    [name] docusate sodium (COLACE) 100 MG capsule Take 200 mg by mouth at bedtime.    [name] ELIQUIS 5 MG TABS tablet TAKE 1 TABLET (5 MG TOTAL) BY MOUTH TWO TIMES DAILY. 60 tablet 6  . flecainide (TAMBOCOR) 50 MG tablet Take 1 tablet (50 mg total) by mouth 2 (two) times daily. 60 tablet 11  . GABAPENTIN PO Take 300 mg by mouth 2 (two) times daily.     . hydrochlorothiazide (MICROZIDE) 12.5 MG capsule Take 12.5 mg by mouth daily.    [name] lisinopril (PRINIVIL,ZESTRIL) 30 MG tablet Take 30 mg by mouth daily.     . metoprolol succinate (TOPROL-XL) 100 MG 24 hr tablet Take 100 mg by mouth daily. Take with or immediately following a meal.    . Multiple Vitamins-Minerals (MULTIVITAMIN PO) Take 1 tablet by mouth daily.    . Oxymetazoline HCl (NASAL SPRAY NA) Place into the nose as directed.    . psyllium (METAMUCIL) 58.6 % powder Take  1 packet by mouth every evening.      No current facility-administered medications for this visit.     Physical Exam: Vitals:   10/05/17 1006  BP: 140/90  Pulse: 60  Weight: 261 lb (118.4 kg)  Height: 5\' 10"  (1.778 m)    GEN- The patient is overweight appearing, alert and oriented x 3 today.   Head- normocephalic, atraumatic Eyes-  Sclera clear, conjunctiva pink Ears- hearing intact Oropharynx- clear Lungs- Clear to ausculation bilaterally, normal work of breathing Heart- Regular rate and rhythm, no murmurs, rubs or gallops, PMI not laterally displaced GI- soft, NT, ND, + BS Extremities- no clubbing, cyanosis, or edema  Wt  Readings from Last 3 Encounters:  10/05/17 261 lb (118.4 kg)  09/20/17 262 lb (118.8 kg)  08/29/17 260 lb 6 oz (118.1 kg)    EKG tracing ordered today is personally reviewed and shows sinus rhythm 60 bpm, PR 190 msec, QRS 90 msec, QTc 434 msec  Assessment and Plan:  1. Paroxysmal atrial fibrillation Well controlled post ablation He is reluctant to stop flecainide.  We discussed risks of this medicine today. I have also discussed ARREST Afib results and have encouraged reduction in ETOH, regular exercise, and weight loss.  He would do much better to make lifestyle change and then come off of his flecainide.  I worry that he does not seem motivated currently to do this. chads2vasc score is 2.  Continue eliquis  2. OSA Uses CPAP Followed by Dr Radford Pax  3. HTN Stable No change required today  4. Obesity Body mass index is 37.45 kg/m. Wt Readings from Last 3 Encounters:  10/05/17 261 lb (118.4 kg)  09/20/17 262 lb (118.8 kg)  08/29/17 260 lb 6 oz (118.1 kg)   Lifestyle modification is again encouraged today  Return in 4 months  Thompson Grayer MD, [facility] 10/05/2017 10:20 AM

## 2017-10-05 NOTE — Patient Instructions (Addendum)
Medication Instructions:  Your physician recommends that you continue on your current medications as directed. Please refer to the Current Medication list given to you today.  Labwork: None ordered.  Testing/Procedures: None ordered.  Follow-Up: Your physician wants you to follow-up in: 4 months with Dr. Allred.      Any Other Special Instructions Will Be Listed Below (If Applicable).  If you need a refill on your cardiac medications before your next appointment, please call your pharmacy.   

## 2017-10-20 ENCOUNTER — Telehealth: Payer: Self-pay | Admitting: Hematology and Oncology

## 2017-10-20 ENCOUNTER — Inpatient Hospital Stay: Payer: 59 | Attending: Hematology and Oncology | Admitting: Hematology and Oncology

## 2017-10-20 DIAGNOSIS — G629 Polyneuropathy, unspecified: Secondary | ICD-10-CM

## 2017-10-20 DIAGNOSIS — C9000 Multiple myeloma not having achieved remission: Secondary | ICD-10-CM

## 2017-10-20 DIAGNOSIS — D472 Monoclonal gammopathy: Secondary | ICD-10-CM

## 2017-10-20 NOTE — Assessment & Plan Note (Addendum)
Patient will be 09/13/2017 lab review:  Kappa: 119, ratio 14 Creatinine 0.91 Calcium 9.3 Beta-2 microglobulin 1.4 Albumin 3.8 Hemoglobin 14.6 IgG 519, IgA 544 M protein 0.5 g  Bone marrow biopsy: 12% plasma cells Bone survey: No lytic lesions  I agree with the current plan of observation which is the current standard of care for smoldering myeloma.  Patient is going to [facility] to see the myeloma specialist.  Peripheral neuropathy: I recommended that the patient get a sural nerve biopsy to evaluate the cause of neuropathy.  He will have every 63-monthblood work and follow-up.

## 2017-10-20 NOTE — Telephone Encounter (Signed)
Per 6/20 patient already scheduled

## 2017-10-20 NOTE — Progress Notes (Signed)
Patient Care Team: Lajean Manes, MD as PCP - General (Internal Medicine) Michael Boston, MD as Consulting Physician (General Surgery) Clarene Essex, MD as Consulting Physician (Gastroenterology) Fay Records, MD as Consulting Physician (Cardiology) Sueanne Margarita, MD as Consulting Physician (Sleep Medicine)  DIAGNOSIS:  Encounter Diagnosis  Name Primary?  . Smoldering multiple myeloma ([place])     SUMMARY OF ONCOLOGIC HISTORY:   Smoldering multiple myeloma ([place])   07/21/2017 Initial Diagnosis    Smoldering multiple myeloma ([place])      09/13/2017 Tumor Marker    tProt 6.8, Alb 3.8, Ca 9.3, Cr 0.9, AP 98 LDH 184, beta-2 microglobulin 1.4 SPEP -- M-spike 0.5g/dL, SIFE -- IgA kappa IgG 519, IgA 544, IgM 29; kappa 119.3, lambda 8.5, KLR 14.04 WBC 7.6, Hgb 14.6, Plt 208       CHIEF COMPLIANT: Follow-up of smoldering multiple myeloma  INTERVAL HISTORY: Travis [name] is a 67 year old who had peripheral neuropathy which led to work-up that revealed elevated monoclonal protein.  Bone marrow biopsy showed 12% plasma cells and because of that he was labeled as smoldering myeloma.  He did not have any other endorgan dysfunctions like hypercalcemia creatinine issues, anemia or lytic lesions.  He is here for recheck of his blood counts.  He was seen by Dr. Lebron Conners previously and he will follow with me from now on.  REVIEW OF SYSTEMS:   Constitutional: Denies fevers, chills or abnormal weight loss Eyes: Denies blurriness of vision Ears, nose, mouth, throat, and face: Denies mucositis or sore throat Respiratory: Denies cough, dyspnea or wheezes Cardiovascular: Denies palpitation, chest discomfort Gastrointestinal:  Denies nausea, heartburn or change in bowel habits Skin: Denies abnormal skin rashes Lymphatics: Denies new lymphadenopathy or easy bruising Neurological:Denies numbness, tingling or new weaknesses Behavioral/Psych: Mood is stable, no new changes  Extremities: No lower  extremity edema All other systems were reviewed with the patient and are negative.  I have reviewed the past medical history, past surgical history, social history and family history with the patient and they are unchanged from previous note.  ALLERGIES:  has No Known Allergies.  MEDICATIONS:  Current Outpatient Medications  Medication Sig Dispense Refill  . amLODipine (NORVASC) 10 MG tablet Take 10 mg by mouth daily.     [name] b complex vitamins tablet Take 1 tablet by mouth daily.    [name] docusate sodium (COLACE) 100 MG capsule Take 200 mg by mouth at bedtime.    [name] ELIQUIS 5 MG TABS tablet TAKE 1 TABLET (5 MG TOTAL) BY MOUTH TWO TIMES DAILY. 60 tablet 6  . flecainide (TAMBOCOR) 50 MG tablet Take 1 tablet (50 mg total) by mouth 2 (two) times daily. 60 tablet 11  . GABAPENTIN PO Take 300 mg by mouth 2 (two) times daily.     . hydrochlorothiazide (MICROZIDE) 12.5 MG capsule Take 12.5 mg by mouth daily.    [name] lisinopril (PRINIVIL,ZESTRIL) 30 MG tablet Take 30 mg by mouth daily.     . metoprolol succinate (TOPROL-XL) 100 MG 24 hr tablet Take 100 mg by mouth daily. Take with or immediately following a meal.    . Multiple Vitamins-Minerals (MULTIVITAMIN PO) Take 1 tablet by mouth daily.    . Oxymetazoline HCl (NASAL SPRAY NA) Place into the nose as directed.    . psyllium (METAMUCIL) 58.6 % powder Take 1 packet by mouth every evening.      No current facility-administered medications for this visit.     PHYSICAL EXAMINATION: ECOG PERFORMANCE STATUS: 1 -  Symptomatic but completely ambulatory  Vitals:   10/20/17 1058  BP: 131/62  Pulse: 62  Temp: 98 F (36.7 C)  SpO2: 95%   Filed Weights   10/20/17 1058  Weight: 262 lb 11.2 oz (119.2 kg)    GENERAL:alert, no distress and comfortable SKIN: skin color, texture, turgor are normal, no rashes or significant lesions EYES: normal, Conjunctiva are pink and non-injected, sclera clear OROPHARYNX:no exudate, no erythema and lips, buccal mucosa,  and tongue normal  NECK: supple, thyroid normal size, non-tender, without nodularity LYMPH:  no palpable lymphadenopathy in the cervical, axillary or inguinal LUNGS: clear to auscultation and percussion with normal breathing effort HEART: regular rate & rhythm and no murmurs and no lower extremity edema ABDOMEN:abdomen soft, non-tender and normal bowel sounds MUSCULOSKELETAL:no cyanosis of digits and no clubbing  NEURO: alert & oriented x 3 with fluent speech, peripheral sensory neuropathy EXTREMITIES: No lower extremity edema  LABORATORY DATA:  I have reviewed the data as listed CMP Latest Ref Rng & Units 09/13/2017 06/16/2017 05/13/2017  Glucose 70 - 140 mg/dL 108 108 -  BUN 7 - 26 mg/dL 15 15 -  Creatinine 0.70 - 1.30 mg/dL 0.91 0.92 -  Sodium 136 - 145 mmol/L 140 141 -  Potassium 3.5 - 5.1 mmol/L 3.7 3.5 -  Chloride 98 - 109 mmol/L 104 104 -  CO2 22 - 29 mmol/L 27 28 -  Calcium 8.4 - 10.4 mg/dL 9.3 9.1 -  Total Protein 6.4 - 8.3 g/dL 6.8 6.8 6.6  Total Bilirubin 0.2 - 1.2 mg/dL 0.4 0.3 -  Alkaline Phos 40 - 150 U/L 98 104 -  AST 5 - 34 U/L 28 22 -  ALT 0 - 55 U/L 39 27 -    Lab Results  Component Value Date   WBC 7.6 09/13/2017   HGB 14.6 09/13/2017   HCT 42.4 09/13/2017   MCV 94.0 09/13/2017   PLT 208 09/13/2017   NEUTROABS 5.1 09/13/2017    ASSESSMENT & PLAN:  Smoldering multiple myeloma ([place]) Patient will be 09/13/2017 lab review:  Kappa: 119, ratio 14 Creatinine 0.91 Calcium 9.3 Beta-2 microglobulin 1.4 Albumin 3.8 Hemoglobin 14.6 IgG 519, IgA 544 M protein 0.5 g  Bone marrow biopsy: 12% plasma cells Bone survey: No lytic lesions  I agree with the current plan of observation which is the current standard of care for smoldering myeloma.  Patient is going to [facility] to see the myeloma specialist.  Peripheral neuropathy: I recommended that the patient get a sural nerve biopsy to evaluate the cause of neuropathy.  He wil comel  every 27-monthblood work  and follow-up.    No orders of the defined types were placed in this encounter.  The patient has a good understanding of the overall plan. he agrees with it. he will call with any problems that may develop before the next visit here.   VHarriette Ohara MD 10/20/17

## 2017-11-23 ENCOUNTER — Ambulatory Visit (INDEPENDENT_AMBULATORY_CARE_PROVIDER_SITE_OTHER): Payer: 59 | Admitting: Neurology

## 2017-11-23 ENCOUNTER — Encounter: Payer: Self-pay | Admitting: Neurology

## 2017-11-23 VITALS — BP 110/70 | HR 60 | Ht 70.0 in | Wt 265.5 lb

## 2017-11-23 DIAGNOSIS — G629 Polyneuropathy, unspecified: Secondary | ICD-10-CM

## 2017-11-23 MED ORDER — GABAPENTIN 300 MG PO CAPS: 300.0000 mg | ORAL_CAPSULE | Freq: Two times a day (BID) | ORAL | 3 refills | Status: DC

## 2017-11-23 NOTE — Patient Instructions (Addendum)
Continue gabapentin 300mg  twice daily  Return to clinic in 6 months

## 2017-11-23 NOTE — Progress Notes (Signed)
Follow-up Visit   Date: 11/23/17    Travis [name] MRN: [id number] DOB: 10-04-1950   Interim History: Travis [name] is a 67 y.o. right-handed Caucasian male with atrial fibrillation s/p ablation (on eliquis), OSA on CPAP, GERD, and hypertension returning to the clinic for follow-up of neuropathy.  The patient was accompanied to the clinic by self.  History of present illness: Starting around 2010, he noticed numbness and tingling over the left foot which over the past year spread into the right.  Symptoms are constant and involve both feet and left lower leg. He denies any weakness or back pain.  He has noticed that his left leg feels heavy after prolonged activity, such as playing golf.  He has not had any falls and denies imbalance.  No identifiable triggers, exacerbating factors, or alleviating factors.  He recalls having history of laser vein surgery over the left thigh about 8 months prior to symptom onset.    He usually drink 1.5 glasses of wine nightly for many years.  No personal history of diabetes or family history of neuropathy.   He had NCS/EMG performed by Dr. Domingo Cocking which was reportedly normal - results will be requested to review.  His diabetes screening has been normal.  He has remote history of vitamin B12 deficiency treated with oral supplementation.  UPDATE 08/29/2017:  He is here for follow-up visit.  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  He has been seeing Dr. 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  Around February, he began having worsening burning and achy pain over the toes.  He does not have imbalance or weakness.  For his pain, gabapentin 200mg  TID was started which has significantly alleviated his pain. His daughter is getting married in July [place] and he and his wife are travelling to [place] in August,  so he is hoping that his myeloma stays quiet.   UPDATE 11/23/2017:  He is here for follow-up visit.  His feet pain is well-controlled on gabapentin 300mg  twice daily, but he is now aware of more constant numbness.  No problems with weakness or balance. He continues to be followed by hematology for smoldering myeloma which is stable.  He does complain of low back pain, which is dull and achy and his PCP started him in PT program.  He denies any radicular pain.  MRI lumbar spine shows mild degenerative changes, no evidence of nerve impingement. 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   Medications:  Current Outpatient Medications on File Prior to Visit  Medication Sig Dispense Refill  . amLODipine (NORVASC) 10 MG tablet Take 10 mg by mouth daily.     [name] b complex vitamins tablet Take 1 tablet by mouth daily.    [name] docusate sodium (COLACE) 100 MG capsule Take 200 mg by mouth at bedtime.    [name] ELIQUIS 5 MG TABS tablet TAKE 1 TABLET (5 MG TOTAL) BY MOUTH TWO TIMES DAILY. 60 tablet 6  . flecainide (TAMBOCOR) 50 MG tablet Take 1 tablet (50 mg total) by mouth 2 (two) times daily. 60 tablet 11  . hydrochlorothiazide (MICROZIDE) 12.5 MG capsule Take 12.5 mg by mouth daily.    [name] lisinopril (PRINIVIL,ZESTRIL) 30 MG tablet Take 30 mg by mouth daily.     . metoprolol succinate (TOPROL-XL) 100 MG  24 hr tablet Take 100 mg by mouth daily. Take with or immediately following a meal.    . Multiple Vitamins-Minerals (MULTIVITAMIN PO) Take 1 tablet by mouth daily.    . Oxymetazoline HCl (NASAL SPRAY NA) Place into the nose as directed.    . psyllium (METAMUCIL) 58.6 % powder Take 1 packet by mouth every evening.      No current facility-administered medications on file prior to visit.     Allergies: No Known Allergies  Review of Systems:  CONSTITUTIONAL: No fevers, chills, night sweats, or weight loss.  EYES: No visual changes or eye pain ENT: No hearing changes.  No history  of nose bleeds.   RESPIRATORY: No cough, wheezing and shortness of breath.   CARDIOVASCULAR: Negative for chest pain, and palpitations.   GI: Negative for abdominal discomfort, blood in stools or black stools.  No recent change in bowel habits.   GU:  No history of incontinence.   MUSCLOSKELETAL: No history of joint pain or swelling.  No myalgias.   SKIN: Negative for lesions, rash, and itching.   ENDOCRINE: Negative for cold or heat intolerance, polydipsia or goiter.   PSYCH:  No depression or anxiety symptoms.   NEURO: As Above.   Vital Signs:  BP 110/70   Pulse 60   Ht 5\' 10"  (1.778 m)   Wt 265 lb 8 oz (120.4 kg)   SpO2 93%   BMI 38.10 kg/m   Neurological Exam: MENTAL STATUS including orientation to time, place, person, recent and remote memory, attention span and concentration, language, and fund of knowledge is normal.  Speech is not dysarthric.  CRANIAL NERVES:  Pupils equal round and reactive to light.  Normal conjugate, extra-ocular eye movements in all directions of gaze.  No ptosis.  Face is symmetric. Palate elevates symmetrically.  Tongue is midline.  MOTOR:  Motor strength is 5/5 in all extremities, including distally.   No pronator drift.  Tone is normal.    MSRs:  Reflexes are 2+/4 throughout, except absent Achilles bilaterally.  SENSORY:  Absent vibration distal to ankles bilaterally.  Temperature and pin prick is reduced over the toes only.  COORDINATION/GAIT:   Gait narrow based and stable.   Data: Labs 05/13/2017:  SPEP with IFE IGA KAPPA MONOCLONAL PROTEIN, TSH 1.54, MMA 156, folate 20.7, copper 103, vitamin B1 34, vitamin B12 329  MRI lumbar spine 11/23/2017: 1. Lumbar spondylosis and degenerative disc disease causing mild impingement at L4-5 and L5-S1. 2. There is some marrow heterogeneity but no focal enhancing lesion characteristic of lumbar myeloma is identified. 3. Mild levoconvex lumbar scoliosis.  IMPRESSION/PLAN: Peripheral neuropathy affecting  the toes bilaterally.  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  - Pain is well-controlled on gabapentin 300mg  twice daily which will be continued  - If symptoms get worse, proceed with NCS/EMG of the legs  Return to clinic in 6 months  Greater than 50% of this 18 minute visit was spent in counseling, explanation of diagnosis, planning of further management, and coordination of care.  Thank you for allowing me to participate in patient's care.  If I can answer any additional questions, I would be pleased to do so.    Sincerely,    Jonesha Tsuchiya K. Posey Pronto, DO

## 2017-12-26 ENCOUNTER — Other Ambulatory Visit: Payer: Self-pay | Admitting: Internal Medicine

## 2017-12-26 ENCOUNTER — Telehealth: Payer: Self-pay | Admitting: Hematology and Oncology

## 2017-12-26 NOTE — Telephone Encounter (Signed)
PT called to r/s upcoming lab appt.

## 2017-12-27 ENCOUNTER — Emergency Department (HOSPITAL_COMMUNITY)
Admission: EM | Admit: 2017-12-27 | Discharge: 2017-12-27 | Disposition: A | Discharge status: Home / Self Care | Payer: 59 | Attending: Emergency Medicine | Admitting: Emergency Medicine

## 2017-12-27 ENCOUNTER — Inpatient Hospital Stay: Payer: 59

## 2017-12-27 ENCOUNTER — Inpatient Hospital Stay: Payer: 59 | Attending: Hematology and Oncology

## 2017-12-27 ENCOUNTER — Other Ambulatory Visit: Payer: Self-pay

## 2017-12-27 ENCOUNTER — Emergency Department (HOSPITAL_COMMUNITY): Payer: 59

## 2017-12-27 ENCOUNTER — Encounter (HOSPITAL_COMMUNITY): Payer: Self-pay

## 2017-12-27 DIAGNOSIS — J189 Pneumonia, unspecified organism: Secondary | ICD-10-CM

## 2017-12-27 DIAGNOSIS — Z7901 Long term (current) use of anticoagulants: Secondary | ICD-10-CM | POA: Insufficient documentation

## 2017-12-27 DIAGNOSIS — R0781 Pleurodynia: Secondary | ICD-10-CM

## 2017-12-27 DIAGNOSIS — Z79899 Other long term (current) drug therapy: Secondary | ICD-10-CM | POA: Diagnosis not present

## 2017-12-27 DIAGNOSIS — R0789 Other chest pain: Secondary | ICD-10-CM | POA: Insufficient documentation

## 2017-12-27 DIAGNOSIS — J181 Lobar pneumonia, unspecified organism: Secondary | ICD-10-CM | POA: Insufficient documentation

## 2017-12-27 DIAGNOSIS — I10 Essential (primary) hypertension: Secondary | ICD-10-CM | POA: Insufficient documentation

## 2017-12-27 DIAGNOSIS — I480 Paroxysmal atrial fibrillation: Secondary | ICD-10-CM | POA: Diagnosis not present

## 2017-12-27 DIAGNOSIS — C9000 Multiple myeloma not having achieved remission: Secondary | ICD-10-CM | POA: Diagnosis not present

## 2017-12-27 DIAGNOSIS — R079 Chest pain, unspecified: Secondary | ICD-10-CM | POA: Diagnosis present

## 2017-12-27 DIAGNOSIS — D472 Monoclonal gammopathy: Secondary | ICD-10-CM

## 2017-12-27 HISTORY — DX: Multiple myeloma not having achieved remission: C90.00

## 2017-12-27 LAB — CMP (CANCER CENTER ONLY)
ALBUMIN: 3.8 g/dL (ref 3.5–5.0)
ALK PHOS: 83 U/L (ref 38–126)
ALT: 45 U/L — ABNORMAL HIGH (ref 0–44)
ANION GAP: 8 (ref 5–15)
AST: 30 U/L (ref 15–41)
BUN: 16 mg/dL (ref 8–23)
CALCIUM: 9.6 mg/dL (ref 8.9–10.3)
CO2: 29 mmol/L (ref 22–32)
CREATININE: 0.89 mg/dL (ref 0.61–1.24)
Chloride: 104 mmol/L (ref 98–111)
GFR, Est AFR Am: >60 mL/min (ref >60)
GFR, Estimated: >60 mL/min (ref >60)
GLUCOSE: 95 mg/dL (ref 70–99)
Potassium: 3.7 mmol/L (ref 3.5–5.1)
Sodium: 141 mmol/L (ref 135–145)
TOTAL PROTEIN: 7 g/dL (ref 6.5–8.1)
Total Bilirubin: 0.4 mg/dL (ref 0.3–1.2)

## 2017-12-27 LAB — CBC WITH DIFFERENTIAL (CANCER CENTER ONLY)
BASOS ABS: 0.1 10*3/uL (ref 0.0–0.1)
BASOS PCT: 1 %
EOS PCT: 2 %
Eosinophils Absolute: 0.2 10*3/uL (ref 0.0–0.5)
HCT: 42.6 % (ref 38.4–49.9)
Hemoglobin: 14.7 g/dL (ref 13.0–17.1)
Lymphocytes Relative: 16 %
Lymphs Abs: 1.3 10*3/uL (ref 0.9–3.3)
MCH: 32.3 pg (ref 27.2–33.4)
MCHC: 34.6 g/dL (ref 32.0–36.0)
MCV: 93.5 fL (ref 79.3–98.0)
MONO ABS: 1.2 10*3/uL — AB (ref 0.1–0.9)
Monocytes Relative: 14 %
Neutro Abs: 5.5 10*3/uL (ref 1.5–6.5)
Neutrophils Relative %: 67 %
PLATELETS: 233 10*3/uL (ref 140–400)
RBC: 4.55 MIL/uL (ref 4.20–5.82)
RDW: 13.3 % (ref 11.0–14.6)
WBC Count: 8.2 10*3/uL (ref 4.0–10.3)

## 2017-12-27 LAB — CBC
HCT: 44.2 % (ref 39.0–52.0)
HEMOGLOBIN: 15.4 g/dL (ref 13.0–17.0)
MCH: 32.3 pg (ref 26.0–34.0)
MCHC: 34.8 g/dL (ref 30.0–36.0)
MCV: 92.7 fL (ref 78.0–100.0)
Platelets: 271 10*3/uL (ref 150–400)
RBC: 4.77 MIL/uL (ref 4.22–5.81)
RDW: 13 % (ref 11.5–15.5)
WBC: 9.4 10*3/uL (ref 4.0–10.5)

## 2017-12-27 LAB — LACTATE DEHYDROGENASE: LDH: 196 U/L — ABNORMAL HIGH (ref 98–192)

## 2017-12-27 LAB — BASIC METABOLIC PANEL
ANION GAP: 12 (ref 5–15)
BUN: 17 mg/dL (ref 8–23)
CALCIUM: 9.5 mg/dL (ref 8.9–10.3)
CHLORIDE: 103 mmol/L (ref 98–111)
CO2: 28 mmol/L (ref 22–32)
CREATININE: 0.91 mg/dL (ref 0.61–1.24)
GFR calc Af Amer: >60 mL/min (ref >60)
GFR calc non Af Amer: >60 mL/min (ref >60)
GLUCOSE: 107 mg/dL — AB (ref 70–99)
Potassium: 3.8 mmol/L (ref 3.5–5.1)
Sodium: 143 mmol/L (ref 135–145)

## 2017-12-27 LAB — I-STAT TROPONIN, ED
TROPONIN I, POC: 0.03 ng/mL (ref 0.00–0.08)
Troponin i, poc: 0 ng/mL (ref 0.00–0.08)

## 2017-12-27 MED ORDER — IOPAMIDOL (ISOVUE-370) INJECTION 76%
INTRAVENOUS | Status: AC
  Filled 2017-12-27: qty 100

## 2017-12-27 MED ORDER — DOXYCYCLINE HYCLATE 100 MG PO CAPS: 100.0000 mg | ORAL_CAPSULE | Freq: Two times a day (BID) | ORAL | 0 refills | Status: AC

## 2017-12-27 MED ORDER — SODIUM CHLORIDE 0.9 % IV SOLN
1.0000 g | Freq: Once | INTRAVENOUS | Status: AC
  Administered 2017-12-27: 1 g via INTRAVENOUS
  Filled 2017-12-27: qty 10

## 2017-12-27 MED ORDER — IOPAMIDOL (ISOVUE-370) INJECTION 76%
100.0000 mL | Freq: Once | INTRAVENOUS | Status: AC | PRN
  Administered 2017-12-27: 100 mL via INTRAVENOUS

## 2017-12-27 NOTE — ED Provider Notes (Signed)
[facility] DEPT Provider Note   CSN: [id number] Arrival date & time: 12/27/17  1419     History   Chief Complaint Chief Complaint  Patient presents with  . Chest Pain    HPI Travis [name] is a 67 y.o. male.  67 year old male with past medical history including smoldering multiple myeloma, atrial fibrillation on anticoagulation, OSA, hypertension who presents with chest pain.  The patient had his influenza shot earlier today.  Around 1:30 pm today while at rest, he began having constant, central, non-radiating chest pain that is worse with deep breathing, associated w/ diaphoresis but not associated with exertion, SOB or N/V. He has never had pain like this before. He recently traveled through [place] for 3 weeks, got back home 3 days ago.  He states he has had some left leg swelling but states that this is chronic.  No new leg swelling/pain, cough, fevers.   He does note that he uses a CPAP mask and while traveling was unable to obtain distilled water. He did notice mold when he cleaned his machine.   The history is provided by the patient.  Chest Pain      Past Medical History:  Diagnosis Date  . Arthritis    "comes and goes; mostly in hands, occasionally elbow" (04/05/2017)  . Complication of anesthesia    "just wanted to sleep alot  for couple days S/P VARICOCELE OR" (04/05/2017)  . DVT (deep venous thrombosis) (HCC)    LLE  . Dysrhythmia    PVCs   . GERD (gastroesophageal reflux disease)   . History of hiatal hernia   . Hypertension   . Impaired glucose tolerance   . Myeloma ([place])   . Obesity (BMI 30-39.9) 07/26/2016  . OSA on CPAP 07/26/2016   Mild with AHI 12.5/hr now on CPAP at 14cm H2O  . Paroxysmal atrial fibrillation (HCC)   . Pneumonia    "may have had walking pneumonia a few years ago" (04/05/2017)  . Thrombophlebitis     Patient Active Problem List   Diagnosis Date Noted  . Neuropathy 08/29/2017  . Smoldering multiple  myeloma ([place]) 07/21/2017  . Paroxysmal atrial fibrillation ([place]) 04/05/2017  . Prolapsed internal hemorrhoids, grade 4, s/p ligation/pexy/hemorrhoidectomy x 2 02/24/2017 02/24/2017  . OSA (obstructive sleep apnea) 07/26/2016  . Obesity (BMI 30-39.9) 07/26/2016  . Atrial fibrillation ([place])   . Hypertension   . Chest pain at rest     Past Surgical History:  Procedure Laterality Date  . ATRIAL FIBRILLATION ABLATION  04/05/2017  . ATRIAL FIBRILLATION ABLATION N/A 04/05/2017   Procedure: ATRIAL FIBRILLATION ABLATION;  Surgeon: Thompson Grayer, MD;  Location: [facility];  Service: Cardiovascular;  Laterality: N/A;  . EVALUATION UNDER ANESTHESIA WITH HEMORRHOIDECTOMY N/A 02/24/2017   Procedure: EXAM UNDER ANESTHESIA WITH  HEMORRHOIDECTOMY;  Surgeon: Michael Boston, MD;  Location: [facility];  Service: General;  Laterality: N/A;  . FLEXIBLE SIGMOIDOSCOPY N/A 04/15/2015   Procedure:  UNSEDATED FLEXIBLE SIGMOIDOSCOPY;  Surgeon: [name], MD;  Location: [facility];  Service: Endoscopy;  Laterality: N/A;  . KNEE ARTHROSCOPY Left ~ 2016  . 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 TONSILLECTOMY    . VARICOSE VEIN SURGERY Bilateral         Home Medications    Prior to Admission medications   Medication Sig Start Date End Date Taking? Authorizing Provider  amLODipine (NORVASC) 10 MG tablet Take 10 mg by mouth daily.    Yes [provider]  b complex vitamins tablet Take 1 tablet by mouth daily.   Yes [provider]  ELIQUIS 5 MG TABS tablet TAKE 1 TABLET (5 MG TOTAL) BY MOUTH TWO TIMES DAILY. Patient taking differently: Take 5 mg by mouth 2 (two) times daily.  06/17/17  Yes Sherran Needs, NP  flecainide (TAMBOCOR) 50 MG tablet TAKE 1 TABLET(50 MG) BY MOUTH TWICE DAILY Patient taking differently: Take 50 mg by mouth 2 (two) times daily.  12/26/17  Yes Fay Records, MD  gabapentin (NEURONTIN) 300 MG capsule Take 1 capsule (300 mg total) by mouth 2 (two) times daily.  11/23/17  Yes Patel, Donika K, DO  hydrochlorothiazide (MICROZIDE) 12.5 MG capsule Take 12.5 mg by mouth daily.   Yes [provider]  lisinopril (PRINIVIL,ZESTRIL) 30 MG tablet Take 30 mg by mouth daily.    Yes [provider]  metoprolol succinate (TOPROL-XL) 100 MG 24 hr tablet Take 100 mg by mouth daily. Take with or immediately following a meal.   Yes [provider]  Multiple Vitamins-Minerals (MULTIVITAMIN PO) Take 1 tablet by mouth daily.   Yes [provider]  psyllium (METAMUCIL) 58.6 % powder Take 1 packet by mouth every evening.    Yes [provider]  sodium chloride (OCEAN) 0.65 % SOLN nasal spray Place 1 spray into both nostrils as needed for congestion.   Yes [provider]  doxycycline (VIBRAMYCIN) 100 MG capsule Take 1 capsule (100 mg total) by mouth 2 (two) times daily for 7 days. 12/27/17 01/03/18  Little, Wenda Overland, MD    Family History Family History  Problem Relation Age of Onset  . Dementia Mother   . Stroke Father   . Atrial fibrillation Father   . Hypertension Father   . Hypertension Paternal Grandfather   . Dementia Maternal Grandmother     Social History Social History   Tobacco Use  . Smoking status: Never Smoker  . Smokeless tobacco: Never Used  Substance Use Topics  . Alcohol use: Yes    Alcohol/week: 14.0 standard drinks    Types: 14 Glasses of wine per week    Comment: daily; a couple of glasses of wine every night   . Drug use: No     Allergies   Patient has no known allergies.   Review of Systems Review of Systems  Cardiovascular: Positive for chest pain.   All other systems reviewed and are negative except that which was mentioned in HPI   Physical Exam Updated Vital Signs BP (!) 142/83   Pulse 64   Temp 98.6 F (37 C) (Oral)   Resp 18   Ht '5\' 10"'  (1.778 m)   Wt 117.9 kg   SpO2 96%   BMI 37.31 kg/m   Physical Exam  Constitutional: He is oriented to person, place, and  time. He appears well-developed and well-nourished. No distress.  HENT:  Head: Normocephalic and atraumatic.  Moist mucous membranes  Eyes: Conjunctivae are normal.  Neck: Neck supple.  Cardiovascular: Normal rate, regular rhythm and normal heart sounds.  No murmur heard. Pulmonary/Chest: Effort normal and breath sounds normal.  Abdominal: Soft. Bowel sounds are normal. He exhibits no distension. There is no tenderness.  Musculoskeletal:       Left lower leg: He exhibits edema (2+ pitting edema ).  Neurological: He is alert and oriented to person, place, and time.  Fluent speech  Skin: Skin is warm and dry.  Psychiatric: He has a normal mood and  affect. Judgment normal.  Nursing note and vitals reviewed.    ED Treatments / Results  Labs (all labs ordered are listed, but only abnormal results are displayed) Labs Reviewed  BASIC METABOLIC PANEL - Abnormal; Notable for the following components:      Result Value   Glucose, Bld 107 (*)    All other components within normal limits  CBC  I-STAT TROPONIN, ED  I-STAT TROPONIN, ED    EKG EKG Interpretation  Date/Time:  Tuesday December 27 2017 14:27:48 EDT Ventricular Rate:  54 PR Interval:    QRS Duration: 95 QT Interval:  475 QTC Calculation: 451 R Axis:   118 Text Interpretation:  Sinus rhythm Right axis deviation Confirmed by Elnora Morrison [phone]), editor Lynder Parents [phone]) on 12/27/2017 3:51:46 PM   Radiology Dg Chest 2 View  Result Date: 12/27/2017 CLINICAL DATA:  Chest pain. EXAM: CHEST - 2 VIEW COMPARISON:  CT chest dated March 31, 2017. Chest x-ray dated October 10, 2016. FINDINGS: The heart is borderline enlarged. Normal mediastinal contours. Normal pulmonary vascularity. New patchy opacities in the left lower lobe on the frontal view, less distinct in appearance on the lateral view. No pleural effusion or pneumothorax. No acute osseous abnormality. IMPRESSION: Left lower lobe atelectasis versus less likely  pneumonia. Electronically Signed   By: Titus Dubin M.D.   On: 12/27/2017 15:51   Ct Angio Chest Pe W/cm &/or Wo Cm  Result Date: 12/27/2017 CLINICAL DATA:  Chest pain EXAM: CT ANGIOGRAPHY CHEST WITH CONTRAST TECHNIQUE: Multidetector CT imaging of the chest was performed using the standard protocol during bolus administration of intravenous contrast. Multiplanar CT image reconstructions and MIPs were obtained to evaluate the vascular anatomy. CONTRAST:  155m ISOVUE-370 IOPAMIDOL (ISOVUE-370) INJECTION 76% COMPARISON:  Chest x-ray from earlier in the same day. FINDINGS: Cardiovascular: Thoracic aorta and its branches are well visualized. No aneurysmal dilatation or dissection is seen. No significant atherosclerotic calcifications of the aorta are noted. No coronary calcifications are seen. Pulmonary artery is well visualize with a normal branching pattern. No filling defects to suggest pulmonary emboli are identified. Mediastinum/Nodes: Thoracic inlet is within normal limits. No significant hilar or mediastinal adenopathy is seen. The esophagus is within normal limits. Lungs/Pleura: Lungs are well aerated bilaterally. Minimal patchy atelectasis/early infiltrate is noted in the left lower lobe similar to that seen on prior plain film examination. No focal nodules are seen. No sizable effusion is noted. Upper Abdomen: Visualized upper abdomen is within normal limits. Musculoskeletal: Degenerative changes of the thoracic spine are noted. Review of the MIP images confirms the above findings. IMPRESSION: No evidence of pulmonary emboli. Patchy atelectasis/early infiltrate in the left lower lobe similar to that seen on prior plain film. No other focal abnormality is noted. Electronically Signed   By: MInez CatalinaM.D.   On: 12/27/2017 16:29    Procedures Procedures (including critical care time)  Medications Ordered in ED Medications  iopamidol (ISOVUE-370) 76 % injection (has no administration in time  range)  iopamidol (ISOVUE-370) 76 % injection 100 mL (100 mLs Intravenous Contrast Given 12/27/17 1609)  cefTRIAXone (ROCEPHIN) 1 g in sodium chloride 0.9 % 100 mL IVPB (1 g Intravenous New Bag/Given 12/27/17 1954)     Initial Impression / Assessment and Plan / ED Course  I have reviewed the triage vital signs and the nursing notes.  Pertinent labs & imaging results that were available during my care of the patient were reviewed by me and considered in my medical decision making (see  chart for details).     Well-appearing and comfortable on exam with stable vital signs.  EKG without acute ischemic changes.  Chest x-ray shows left lower lobe atelectasis versus pneumonia.  He denies any cough or breathing problems.  He has multiple risk factors for PE including previous DVT, recent international travel, and multiple myeloma.  Although he is on anticoagulation for A. fib, I am concerned about PE therefore obtain CTA chest.  Serial troponins negative, all lab work unremarkable including normal WBC count.  CTA shows no evidence of PE, possible early infiltrate in left lower lobe.  It is possible that the patient is having diaphragmatic irritation from an early pneumonia especially given that his pain is pleuritic.  He has no exertional features of his pain and no associated symptoms to suggest ACS.  Based on CT abnormalities, I feel that he is appropriate for outpatient management of pneumonia.  He has been able to ambulate here with no hypoxia and no breathing problems.  He feels comfortable with this plan.  Gave dose of ceftriaxone in the ED and prescription for doxycycline.  Instructed patient to see PCP in a few days for reevaluation.  I have extensively reviewed return precautions with him including any fevers, worsening symptoms, breathing problems, or new symptoms such as lightheadedness, nausea vomiting, or exertional chest pain.  He voiced understanding.  Final Clinical Impressions(s) / ED  Diagnoses   Final diagnoses:  Pneumonia of left lower lobe due to infectious organism [facility])  Pleuritic chest pain    ED Discharge Orders         Ordered    doxycycline (VIBRAMYCIN) 100 MG capsule  2 times daily     12/27/17 2024           Little, Wenda Overland, MD 12/27/17 2028

## 2017-12-27 NOTE — ED Triage Notes (Signed)
Patient c/o mid chest pain that began after he received a pneumonia shot. Patient denies any SOB,but states he has CP when he takes a deep breath.

## 2017-12-27 NOTE — ED Notes (Signed)
Ambulated Pt in hall with pulse ox per Dr. Rex Kras. Pt ambulated without assistance. Pt's O2 started at 97 when ambulating dropped to 93-96. Pt's respirations at rest were 23 before starting and during ambulation was 32-40. Pt denied dizziness and stated he did not feel short of breath, but he stated his pain worsens when taking a deep breath which he felt like he needed to do with ambulation. Pt's O2 stats remain at 95 after being at rest for 5 minutes after ambulation.

## 2017-12-28 LAB — KAPPA/LAMBDA LIGHT CHAINS
KAPPA FREE LGHT CHN: 122 mg/L — AB (ref 3.3–19.4)
Kappa, lambda light chain ratio: 14.88 — ABNORMAL HIGH (ref 0.26–1.65)
Lambda free light chains: 8.2 mg/L (ref 5.7–26.3)

## 2017-12-28 LAB — MULTIPLE MYELOMA PANEL, SERUM
ALBUMIN/GLOB SERPL: 1.4 (ref 0.7–1.7)
ALPHA 1: 0.2 g/dL (ref 0.0–0.4)
Albumin SerPl Elph-Mcnc: 3.7 g/dL (ref 2.9–4.4)
Alpha2 Glob SerPl Elph-Mcnc: 0.7 g/dL (ref 0.4–1.0)
B-Globulin SerPl Elph-Mcnc: 1.5 g/dL — ABNORMAL HIGH (ref 0.7–1.3)
GAMMA GLOB SERPL ELPH-MCNC: 0.4 g/dL (ref 0.4–1.8)
GLOBULIN, TOTAL: 2.8 g/dL (ref 2.2–3.9)
IGA: 544 mg/dL — AB (ref 61–437)
IgG (Immunoglobin G), Serum: 526 mg/dL — ABNORMAL LOW (ref 700–1600)
IgM (Immunoglobulin M), Srm: 27 mg/dL (ref 20–172)
M Protein SerPl Elph-Mcnc: 0.6 g/dL — ABNORMAL HIGH
Total Protein ELP: 6.5 g/dL (ref 6.0–8.5)

## 2017-12-28 LAB — VISCOSITY, SERUM: VISCOSITY, SERUM: 1.6 rel.saline (ref 1.6–1.9)

## 2017-12-28 LAB — BETA 2 MICROGLOBULIN, SERUM: BETA 2 MICROGLOBULIN: 1.6 mg/L (ref 0.6–2.4)

## 2017-12-29 ENCOUNTER — Other Ambulatory Visit: Payer: Self-pay

## 2017-12-29 DIAGNOSIS — C9000 Multiple myeloma not having achieved remission: Secondary | ICD-10-CM | POA: Diagnosis not present

## 2017-12-30 ENCOUNTER — Other Ambulatory Visit: Payer: Self-pay

## 2017-12-30 DIAGNOSIS — C9000 Multiple myeloma not having achieved remission: Secondary | ICD-10-CM

## 2017-12-30 DIAGNOSIS — D472 Monoclonal gammopathy: Secondary | ICD-10-CM

## 2017-12-30 LAB — UIFE/LIGHT CHAINS/TP QN, 24-HR UR
FR KAPPA LT CH,24HR: 2066 mg/(24.h)
FR LAMBDA LT CH,24HR: 6 mg/24 hr
FREE LAMBDA LT CHAINS, UR: 4.66 mg/L (ref 0.24–6.66)
Free Kappa Lt Chains,Ur: 1530 mg/L — ABNORMAL HIGH (ref 1.35–24.19)
Free Kappa/Lambda Ratio: 328.33 — ABNORMAL HIGH (ref 2.04–10.37)
TOTAL VOLUME: 1350
Total Protein, Urine-Ur/day: 279 mg/24 hr — ABNORMAL HIGH (ref 30–150)
Total Protein, Urine: 20.7 mg/dL

## 2018-01-03 ENCOUNTER — Telehealth: Payer: Self-pay | Admitting: Hematology and Oncology

## 2018-01-03 ENCOUNTER — Inpatient Hospital Stay: Payer: 59

## 2018-01-03 ENCOUNTER — Inpatient Hospital Stay: Payer: 59 | Attending: Hematology and Oncology | Admitting: Hematology and Oncology

## 2018-01-03 DIAGNOSIS — C9000 Multiple myeloma not having achieved remission: Secondary | ICD-10-CM

## 2018-01-03 DIAGNOSIS — D472 Monoclonal gammopathy: Secondary | ICD-10-CM

## 2018-01-03 NOTE — Telephone Encounter (Signed)
Per 9/3 los, gave patient avs and calendar.

## 2018-01-03 NOTE — Assessment & Plan Note (Signed)
Lab review 12/29/2017: 1.  Urine free kappa: 1530 (decreased from 1780), kappa lambda ratio 328 (increased from 150) 2. IgG 526, IgA 544, M protein 0.6 (IgA kappa), it was previously 0.5 g 3.  Serum kappa 122, ratio 14.88 4.  Beta-2 microglobulin 1.6, LDH 196, creatinine 0.89, calcium 9.6 5.  Hemoglobin 14.7, WBC 8.2, ANC 5.5  There once again no signs of progression of disease Continue watchful monitoring with recheck in 6 months with labs done ahead of time and follow-up

## 2018-01-03 NOTE — Progress Notes (Signed)
Patient Care Team: Lajean Manes, MD as PCP - General (Internal Medicine) Michael Boston, MD as Consulting Physician (General Surgery) Clarene Essex, MD as Consulting Physician (Gastroenterology) Fay Records, MD as Consulting Physician (Cardiology) Sueanne Margarita, MD as Consulting Physician (Sleep Medicine)  DIAGNOSIS:  Encounter Diagnosis  Name Primary?  . Smoldering multiple myeloma ([place])     SUMMARY OF ONCOLOGIC HISTORY:   Smoldering multiple myeloma ([place])   07/21/2017 Initial Diagnosis    Smoldering multiple myeloma ([place])    09/13/2017 Tumor Marker    tProt 6.8, Alb 3.8, Ca 9.3, Cr 0.9, AP 98 LDH 184, beta-2 microglobulin 1.4 SPEP -- M-spike 0.5g/dL, SIFE -- IgA kappa IgG 519, IgA 544, IgM 29; kappa 119.3, lambda 8.5, KLR 14.04 WBC 7.6, Hgb 14.6, Plt 208     CHIEF COMPLIANT: Follow-up of smoldering myeloma  INTERVAL HISTORY: Travis [name] is a 69-year with above-mentioned history of smoldering myeloma who is here for 10-monthfollow-up with blood work done recently.  [place] and is having lots of aches and pains but they appear to be getting better.  REVIEW OF SYSTEMS:   Constitutional: Denies fevers, chills or abnormal weight loss Eyes: Denies blurriness of vision Ears, nose, mouth, throat, and face: Denies mucositis or sore throat Respiratory: Denies cough, dyspnea or wheezes Cardiovascular: Denies palpitation, chest discomfort Gastrointestinal:  Denies nausea, heartburn or change in bowel habits Skin: Denies abnormal skin rashes Lymphatics: Denies new lymphadenopathy or easy bruising Neurological:Denies numbness, tingling or new weaknesses Behavioral/Psych: Mood is stable, no new changes  Extremities: No lower extremity edema  All other systems were reviewed with the patient and are negative.  I have reviewed the past medical history, past surgical history, social history and family history with the patient and they are  unchanged from previous note.  ALLERGIES:  has No Known Allergies.  MEDICATIONS:  Current Outpatient Medications  Medication Sig Dispense Refill  . amLODipine (NORVASC) 10 MG tablet Take 10 mg by mouth daily.     .Marland Kitchenb complex vitamins tablet Take 1 tablet by mouth daily.    .Marland Kitchendoxycycline (VIBRAMYCIN) 100 MG capsule Take 1 capsule (100 mg total) by mouth 2 (two) times daily for 7 days. 14 capsule 0  . ELIQUIS 5 MG TABS tablet TAKE 1 TABLET (5 MG TOTAL) BY MOUTH TWO TIMES DAILY. (Patient taking differently: Take 5 mg by mouth 2 (two) times daily. ) 60 tablet 6  . flecainide (TAMBOCOR) 50 MG tablet TAKE 1 TABLET(50 MG) BY MOUTH TWICE DAILY (Patient taking differently: Take 50 mg by mouth 2 (two) times daily. ) 60 tablet 6  . gabapentin (NEURONTIN) 300 MG capsule Take 1 capsule (300 mg total) by mouth 2 (two) times daily. 180 capsule 3  . hydrochlorothiazide (MICROZIDE) 12.5 MG capsule Take 12.5 mg by mouth daily.    .Marland Kitchenlisinopril (PRINIVIL,ZESTRIL) 30 MG tablet Take 30 mg by mouth daily.     . metoprolol succinate (TOPROL-XL) 100 MG 24 hr tablet Take 100 mg by mouth daily. Take with or immediately following a meal.    . Multiple Vitamins-Minerals (MULTIVITAMIN PO) Take 1 tablet by mouth daily.    . psyllium (METAMUCIL) 58.6 % powder Take 1 packet by mouth every evening.     . sodium chloride (OCEAN) 0.65 % SOLN nasal spray Place 1 spray into both nostrils as needed for congestion.     No current facility-administered medications for this visit.  PHYSICAL EXAMINATION: ECOG PERFORMANCE STATUS: 1 - Symptomatic but completely ambulatory  Vitals:   01/03/18 1124  BP: (!) 146/70  Pulse: (!) 56  Resp: 18  Temp: 98.4 F (36.9 C)  SpO2: 96%   Filed Weights   01/03/18 1124  Weight: 263 lb 12.8 oz (119.7 kg)    GENERAL:alert, no distress and comfortable SKIN: skin color, texture, turgor are normal, no rashes or significant lesions EYES: normal, Conjunctiva are pink and non-injected,  sclera clear OROPHARYNX:no exudate, no erythema and lips, buccal mucosa, and tongue normal  NECK: supple, thyroid normal size, non-tender, without nodularity LYMPH:  no palpable lymphadenopathy in the cervical, axillary or inguinal LUNGS: clear to auscultation and percussion with normal breathing effort HEART: regular rate & rhythm and no murmurs and no lower extremity edema ABDOMEN:abdomen soft, non-tender and normal bowel sounds MUSCULOSKELETAL:no cyanosis of digits and no clubbing  NEURO: alert & oriented x 3 with fluent speech, no focal motor/sensory deficits EXTREMITIES: No lower extremity edema   LABORATORY DATA:  I have reviewed the data as listed CMP Latest Ref Rng & Units 12/27/2017 12/27/2017 09/13/2017  Glucose 70 - 99 mg/dL 107(H) 95 108  BUN 8 - 23 mg/dL '17 16 15  ' Creatinine 0.61 - 1.24 mg/dL 0.91 0.89 0.91  Sodium 135 - 145 mmol/L 143 141 140  Potassium 3.5 - 5.1 mmol/L 3.8 3.7 3.7  Chloride 98 - 111 mmol/L 103 104 104  CO2 22 - 32 mmol/L '28 29 27  ' Calcium 8.9 - 10.3 mg/dL 9.5 9.6 9.3  Total Protein 6.5 - 8.1 g/dL - 7.0 6.8  Total Bilirubin 0.3 - 1.2 mg/dL - 0.4 0.4  Alkaline Phos 38 - 126 U/L - 83 98  AST 15 - 41 U/L - 30 28  ALT 0 - 44 U/L - 45(H) 39    Lab Results  Component Value Date   WBC 9.4 12/27/2017   HGB 15.4 12/27/2017   HCT 44.2 12/27/2017   MCV 92.7 12/27/2017   PLT 271 12/27/2017   NEUTROABS 5.5 12/27/2017    ASSESSMENT & PLAN:  Smoldering multiple myeloma ([place]) Lab review 12/29/2017: 1.  Urine free kappa: 1530 (decreased from 1780), kappa lambda ratio 328 (increased from 150) 2. IgG 526, IgA 544, M protein 0.6 (IgA kappa), it was previously 0.5 g 3.  Serum kappa 122, ratio 14.88 4.  Beta-2 microglobulin 1.6, LDH 196, creatinine 0.89, calcium 9.6 5.  Hemoglobin 14.7, WBC 8.2, ANC 5.5  There once again no signs of progression of disease Continue watchful monitoring with recheck in 3 months with labs done ahead of time and follow-up  No orders  of the defined types were placed in this encounter.  The patient has a good understanding of the overall plan. he agrees with it. he will call with any problems that may develop before the next visit here.   Harriette Ohara, MD 01/03/18

## 2018-01-12 ENCOUNTER — Ambulatory Visit (HOSPITAL_COMMUNITY)
Admission: RE | Admit: 2018-01-12 | Discharge: 2018-01-12 | Disposition: A | Discharge status: Home / Self Care | Payer: 59 | Source: Ambulatory Visit | Attending: Nurse Practitioner | Admitting: Nurse Practitioner

## 2018-01-12 ENCOUNTER — Other Ambulatory Visit: Payer: Self-pay | Admitting: Geriatric Medicine

## 2018-01-12 ENCOUNTER — Encounter (HOSPITAL_COMMUNITY): Payer: Self-pay | Admitting: Nurse Practitioner

## 2018-01-12 ENCOUNTER — Ambulatory Visit
Admission: RE | Admit: 2018-01-12 | Discharge: 2018-01-12 | Disposition: A | Discharge status: Home / Self Care | Payer: 59 | Source: Ambulatory Visit | Attending: Geriatric Medicine | Admitting: Geriatric Medicine

## 2018-01-12 VITALS — BP 126/74 | HR 58 | Ht 70.0 in | Wt 260.8 lb

## 2018-01-12 DIAGNOSIS — I480 Paroxysmal atrial fibrillation: Secondary | ICD-10-CM | POA: Diagnosis present

## 2018-01-12 DIAGNOSIS — Z7901 Long term (current) use of anticoagulants: Secondary | ICD-10-CM | POA: Diagnosis not present

## 2018-01-12 DIAGNOSIS — Z79899 Other long term (current) drug therapy: Secondary | ICD-10-CM | POA: Insufficient documentation

## 2018-01-12 DIAGNOSIS — E669 Obesity, unspecified: Secondary | ICD-10-CM | POA: Diagnosis not present

## 2018-01-12 DIAGNOSIS — R002 Palpitations: Secondary | ICD-10-CM | POA: Diagnosis not present

## 2018-01-12 DIAGNOSIS — I493 Ventricular premature depolarization: Secondary | ICD-10-CM | POA: Diagnosis not present

## 2018-01-12 DIAGNOSIS — K449 Diaphragmatic hernia without obstruction or gangrene: Secondary | ICD-10-CM | POA: Diagnosis not present

## 2018-01-12 DIAGNOSIS — J189 Pneumonia, unspecified organism: Secondary | ICD-10-CM

## 2018-01-12 DIAGNOSIS — R55 Syncope and collapse: Secondary | ICD-10-CM | POA: Diagnosis not present

## 2018-01-12 DIAGNOSIS — R001 Bradycardia, unspecified: Secondary | ICD-10-CM | POA: Insufficient documentation

## 2018-01-12 DIAGNOSIS — K219 Gastro-esophageal reflux disease without esophagitis: Secondary | ICD-10-CM | POA: Insufficient documentation

## 2018-01-12 DIAGNOSIS — M1990 Unspecified osteoarthritis, unspecified site: Secondary | ICD-10-CM | POA: Insufficient documentation

## 2018-01-12 DIAGNOSIS — Z9889 Other specified postprocedural states: Secondary | ICD-10-CM | POA: Diagnosis present

## 2018-01-12 DIAGNOSIS — I10 Essential (primary) hypertension: Secondary | ICD-10-CM | POA: Insufficient documentation

## 2018-01-12 DIAGNOSIS — G4733 Obstructive sleep apnea (adult) (pediatric): Secondary | ICD-10-CM | POA: Diagnosis not present

## 2018-01-12 DIAGNOSIS — Z86718 Personal history of other venous thrombosis and embolism: Secondary | ICD-10-CM | POA: Insufficient documentation

## 2018-01-12 DIAGNOSIS — Z6830 Body mass index (BMI) 30.0-30.9, adult: Secondary | ICD-10-CM | POA: Diagnosis not present

## 2018-01-12 NOTE — Progress Notes (Signed)
Primary Care Physician: Lajean Manes, MD Referring Physician: Dr. [name] is a 67 y.o. male with a h/o paroxysmal afib s/p ablation in 04/2017, in the afib clinic for palpitations that he noticed yesterday am that made him feel presyncopal. He has had PVC's in the past and describe a heart rhythm that sounds very similar to this. It did not feel like afib. The palpitations lasted until bedtime but today he feels better. EKg today shows NSR. He reports that he returned form [place] with feels run down and had chest discomfort. He went to the ER at [facility] and was found to have Pneumonia. He was treated with doxycycline and has not finished treatment. CXR repeated today shows pneumonia has cleared. He does drink 11/2 glasses a wine a night.  Today, he denies symptoms of palpitations, chest pain, shortness of breath, orthopnea, PND, lower extremity edema, dizziness, presyncope, syncope, or neurologic sequela. The patient is tolerating medications without difficulties and is otherwise without complaint today.   Past Medical History:  Diagnosis Date  . Arthritis    "comes and goes; mostly in hands, occasionally elbow" (04/05/2017)  . Complication of anesthesia    "just wanted to sleep alot  for couple days S/P VARICOCELE OR" (04/05/2017)  . DVT (deep venous thrombosis) (HCC)    LLE  . Dysrhythmia    PVCs   . GERD (gastroesophageal reflux disease)   . History of hiatal hernia   . Hypertension   . Impaired glucose tolerance   . Myeloma ([place])   . Obesity (BMI 30-39.9) 07/26/2016  . OSA on CPAP 07/26/2016   Mild with AHI 12.5/hr now on CPAP at 14cm H2O  . Paroxysmal atrial fibrillation (HCC)   . Pneumonia    "may have had walking pneumonia a few years ago" (04/05/2017)  . Thrombophlebitis    Past Surgical History:  Procedure Laterality Date  . ATRIAL FIBRILLATION ABLATION  04/05/2017  . ATRIAL FIBRILLATION ABLATION N/A 04/05/2017   Procedure: ATRIAL FIBRILLATION ABLATION;   Surgeon: Thompson Grayer, MD;  Location: [facility];  Service: Cardiovascular;  Laterality: N/A;  . EVALUATION UNDER ANESTHESIA WITH HEMORRHOIDECTOMY N/A 02/24/2017   Procedure: EXAM UNDER ANESTHESIA WITH  HEMORRHOIDECTOMY;  Surgeon: Michael Boston, MD;  [facility];  Service: General;  Laterality: N/A;  . FLEXIBLE SIGMOIDOSCOPY N/A 04/15/2015   Procedure:  UNSEDATED FLEXIBLE SIGMOIDOSCOPY;  Surgeon: [name], MD;  [facility];  Service: Endoscopy;  Laterality: N/A;  . KNEE ARTHROSCOPY Left ~ 2016  . 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 TONSILLECTOMY    . VARICOSE VEIN SURGERY Bilateral     Current Outpatient Medications  Medication Sig Dispense Refill  . amLODipine (NORVASC) 10 MG tablet Take 10 mg by mouth daily.     [name] b complex vitamins tablet Take 1 tablet by mouth daily.    [name] ELIQUIS 5 MG TABS tablet TAKE 1 TABLET (5 MG TOTAL) BY MOUTH TWO TIMES DAILY. (Patient taking differently: Take 5 mg by mouth 2 (two) times daily. ) 60 tablet 6  . flecainide (TAMBOCOR) 50 MG tablet TAKE 1 TABLET(50 MG) BY MOUTH TWICE DAILY (Patient taking differently: Take 50 mg by mouth 2 (two) times daily. ) 60 tablet 6  . gabapentin (NEURONTIN) 300 MG capsule Take 1 capsule (300 mg total) by mouth 2 (two) times daily. 180 capsule 3  . hydrochlorothiazide (MICROZIDE) 12.5 MG capsule Take 12.5 mg by mouth daily.    [name] lisinopril (  PRINIVIL,ZESTRIL) 30 MG tablet Take 30 mg by mouth daily.     . metoprolol succinate (TOPROL-XL) 100 MG 24 hr tablet Take 100 mg by mouth daily. Take with or immediately following a meal.    . Multiple Vitamins-Minerals (MULTIVITAMIN PO) Take 1 tablet by mouth daily.    . psyllium (METAMUCIL) 58.6 % powder Take 1 packet by mouth every evening.     . sodium chloride (OCEAN) 0.65 % SOLN nasal spray Place 1 spray into both nostrils as needed for congestion.     No current facility-administered medications for this encounter.     No Known Allergies  Social  History   Socioeconomic History  . Marital status: Married    Spouse name: Not on file  . Number of children: 1  . Years of education: law school  . Highest education level: Not on file  Occupational History  . Occupation: Chief Executive Officer  Social Needs  . Financial resource strain: Not on file  . Food insecurity:    Worry: Not on file    Inability: Not on file  . Transportation needs:    Medical: Not on file    Non-medical: Not on file  Tobacco Use  . Smoking status: Never Smoker  . Smokeless tobacco: Never Used  Substance and Sexual Activity  . Alcohol use: Yes    Alcohol/week: 14.0 standard drinks    Types: 14 Glasses of wine per week    Comment: daily; a couple of glasses of wine every night   . Drug use: No  . Sexual activity: Not Currently  Lifestyle  . Physical activity:    Days per week: Not on file    Minutes per session: Not on file  . Stress: Not on file  Relationships  . Social connections:    Talks on phone: Not on file    Gets together: Not on file    Attends religious service: Not on file    Active member of club or organization: Not on file    Attends meetings of clubs or organizations: Not on file    Relationship status: Not on file  . Intimate partner violence:    Fear of current or ex partner: Not on file    Emotionally abused: Not on file    Physically abused: Not on file    Forced sexual activity: Not on file  Other Topics Concern  . Not on file  Social History Narrative   Lives in [place] with spouse in a 2 story home.     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     Family History  Problem Relation Age of Onset  . Dementia Mother   . Stroke Father   . Atrial fibrillation Father   . Hypertension Father   . Hypertension Paternal Grandfather   . Dementia Maternal Grandmother     ROS- All systems are reviewed and negative except as per the HPI above  Physical Exam: Vitals:   01/12/18 1458  BP: 126/74    Pulse: (!) 58  Weight: 118.3 kg  Height: 5\' 10"  (1.778 m)   Wt Readings from Last 3 Encounters:  01/12/18 118.3 kg  01/03/18 119.7 kg  12/27/17 117.9 kg    Labs: Lab Results  Component Value Date   NA 143 12/27/2017   K 3.8 12/27/2017   CL 103 12/27/2017   CO2 28 12/27/2017   GLUCOSE 107 (H) 12/27/2017   BUN 17 12/27/2017  CREATININE 0.91 12/27/2017   CALCIUM 9.5 12/27/2017   MG 2.3 01/06/2017   Lab Results  Component Value Date   INR 1.16 07/12/2017   No results found for: CHOL, HDL, LDLCALC, TRIG   GEN- The patient is well appearing, alert and oriented x 3 today.   Head- normocephalic, atraumatic Eyes-  Sclera clear, conjunctiva pink Ears- hearing intact Oropharynx- clear Neck- supple, no JVP Lymph- no cervical lymphadenopathy Lungs- Clear to ausculation bilaterally, normal work of breathing Heart- Regular rate and rhythm, no murmurs, rubs or gallops, PMI not laterally displaced GI- soft, NT, ND, + BS Extremities- no clubbing, cyanosis, or edema MS- no significant deformity or atrophy Skin- no rash or lesion Psych- euthymic mood, full affect Neuro- strength and sensation are intact  EKG- Sinus brady at 58 bpm, pr int 202 ms, qrs int 88 ms, qtc 435 ms Epic records reviewed    Assessment and Plan: 1. Afib S/p ablation Appears to be quiet Continue flecainide and metoprolol Continue cpap Reduce alcohol to no more than 2 drinks a week  2. Presyncope Felt very dizzy and lightheaded yesterday am with feeling of intermittent heart pounding, per pt felt like PVC's  Improved today  2 week Zio patch  Continue BB without change  3. Pneumonia Present at time of return from [place] with use of doxycycline  F/u with Dr. Rayann Heman 10/7  Geroge Baseman. Elenore Wanninger, [facility] [address] [place], [place] [address] [phone]

## 2018-01-12 NOTE — Patient Instructions (Signed)
Turn in monitor on 9/26

## 2018-01-17 ENCOUNTER — Other Ambulatory Visit (HOSPITAL_COMMUNITY): Payer: Self-pay | Admitting: Nurse Practitioner

## 2018-01-17 ENCOUNTER — Encounter: Payer: Self-pay | Admitting: Internal Medicine

## 2018-02-06 ENCOUNTER — Ambulatory Visit: Payer: PRIVATE HEALTH INSURANCE | Admitting: Internal Medicine

## 2018-02-14 ENCOUNTER — Other Ambulatory Visit (HOSPITAL_COMMUNITY): Payer: Self-pay | Admitting: *Deleted

## 2018-02-14 ENCOUNTER — Encounter (HOSPITAL_COMMUNITY): Payer: Self-pay | Admitting: *Deleted

## 2018-02-15 ENCOUNTER — Ambulatory Visit (INDEPENDENT_AMBULATORY_CARE_PROVIDER_SITE_OTHER): Payer: 59

## 2018-02-15 ENCOUNTER — Encounter: Payer: Self-pay | Admitting: Podiatry

## 2018-02-15 ENCOUNTER — Ambulatory Visit (INDEPENDENT_AMBULATORY_CARE_PROVIDER_SITE_OTHER): Payer: 59 | Admitting: Podiatry

## 2018-02-15 DIAGNOSIS — M722 Plantar fascial fibromatosis: Secondary | ICD-10-CM

## 2018-02-15 DIAGNOSIS — G63 Polyneuropathy in diseases classified elsewhere: Secondary | ICD-10-CM

## 2018-02-15 DIAGNOSIS — E349 Endocrine disorder, unspecified: Secondary | ICD-10-CM

## 2018-02-15 MED ORDER — TRIAMCINOLONE ACETONIDE 10 MG/ML IJ SUSP
10.0000 mg | Freq: Once | INTRAMUSCULAR | Status: AC
  Administered 2018-02-15: 10 mg

## 2018-02-15 NOTE — Patient Instructions (Signed)

## 2018-02-16 NOTE — Progress Notes (Signed)
Subjective:   Patient ID: Travis [name], male   DOB: 67 y.o.   MRN: [id number]   HPI Patient states the bottom of the left heel has been hurting for the last month and he does not remember specific injury.  States is gotten gradually worse over that time and is worse when he gets up or after periods of sitting.  Patient does not smoke likes to be active   Review of Systems  All other systems reviewed and are negative.       Objective:  Physical Exam  Constitutional: He appears well-developed and well-nourished.  Cardiovascular: Intact distal pulses.  Pulmonary/Chest: Effort normal.  Musculoskeletal: Normal range of motion.  Neurological: He is alert.  Skin: Skin is warm.  Nursing note and vitals reviewed.   Neurovascular status intact muscle strength was found to be adequate range of motion within normal limits with exquisite discomfort plantar aspect left heel insertional point tendon calcaneus with inflammation fluid buildup around the medial band.  Patient's found to have good digital perfusion and is well oriented x3     Assessment:  Acute plantar fasciitis left with inflammation fluid buildup     Plan:  H&P condition reviewed and injected the plantar fascial left 3 mg Kenalog 5 Milgram Xylocaine and instructed on physical therapy supportive shoe gear.  Reappoint to recheck  X-rays indicate spur but no indication stress fracture or advanced arthritis

## 2018-02-24 ENCOUNTER — Other Ambulatory Visit: Payer: Self-pay | Admitting: Internal Medicine

## 2018-03-13 ENCOUNTER — Ambulatory Visit (INDEPENDENT_AMBULATORY_CARE_PROVIDER_SITE_OTHER): Payer: 59 | Admitting: Internal Medicine

## 2018-03-13 ENCOUNTER — Encounter: Payer: Self-pay | Admitting: Internal Medicine

## 2018-03-13 VITALS — BP 138/80 | HR 56 | Ht 70.0 in | Wt 260.6 lb

## 2018-03-13 DIAGNOSIS — E669 Obesity, unspecified: Secondary | ICD-10-CM

## 2018-03-13 DIAGNOSIS — I10 Essential (primary) hypertension: Secondary | ICD-10-CM | POA: Diagnosis not present

## 2018-03-13 DIAGNOSIS — G4733 Obstructive sleep apnea (adult) (pediatric): Secondary | ICD-10-CM

## 2018-03-13 DIAGNOSIS — I480 Paroxysmal atrial fibrillation: Secondary | ICD-10-CM | POA: Diagnosis not present

## 2018-03-13 MED ORDER — METOPROLOL SUCCINATE ER 50 MG PO TB24: 50.0000 mg | ORAL_TABLET | Freq: Every day | ORAL | 3 refills | Status: DC

## 2018-03-13 NOTE — Patient Instructions (Addendum)
Medication Instructions:  Your physician has recommended you make the following change in your medication:   1.  STOP taking FLECAINIDE  2.  REDUCE your TOPROL XL---Take a 1/2 tablet of your Toprol XL 100 mg by mouth once a day.  When you have used up all your Toprol XL 100 mg tablets your NEXT prescription will be for Toprol XL 50 mg tabs (then you will take one whole tablet daily).  Labwork: None ordered.  Testing/Procedures: None ordered.  Follow-Up:  Your physician wants you to follow-up in: 6 months with Roderic Palau NP at the afib clinic.   You will receive a reminder letter in the mail two months in advance. If you don't receive a letter, please call our office to schedule the follow-up appointment.  Your physician wants you to follow-up in: one year with Dr. Rayann Heman.   You will receive a reminder letter in the mail two months in advance. If you don't receive a letter, please call our office to schedule the follow-up appointment.  Any Other Special Instructions Will Be Listed Below (If Applicable).  If you need a refill on your cardiac medications before your next appointment, please call your pharmacy.

## 2018-03-13 NOTE — Progress Notes (Signed)
PCP: Lajean Manes, MD Primary Cardiologist: Dr Harrington Challenger Primary EP: Dr Rayann Heman  Travis [name] is a 67 y.o. male who presents today for routine electrophysiology followup.  Since last being seen in our clinic, the patient reports doing very well. He recently had pneumonia from which he has recovered.  He was evaluated in the AF clinic in September for palpitations.  Zio monitor revealed sinus with PACs and PVCs, nonsustained atach also observed.  No afib.  Today, he denies symptoms of palpitations, chest pain, shortness of breath,  lower extremity edema, dizziness, presyncope, or syncope.  The patient is otherwise without complaint today.   Past Medical History:  Diagnosis Date  . Arthritis    "comes and goes; mostly in hands, occasionally elbow" (04/05/2017)  . Complication of anesthesia    "just wanted to sleep alot  for couple days S/P VARICOCELE OR" (04/05/2017)  . DVT (deep venous thrombosis) (HCC)    LLE  . Dysrhythmia    PVCs   . GERD (gastroesophageal reflux disease)   . History of hiatal hernia   . Hypertension   . Impaired glucose tolerance   . Myeloma ([place])   . Obesity (BMI 30-39.9) 07/26/2016  . OSA on CPAP 07/26/2016   Mild with AHI 12.5/hr now on CPAP at 14cm H2O  . Paroxysmal atrial fibrillation (HCC)   . Pneumonia    "may have had walking pneumonia a few years ago" (04/05/2017)  . Thrombophlebitis    Past Surgical History:  Procedure Laterality Date  . ATRIAL FIBRILLATION ABLATION  04/05/2017  . ATRIAL FIBRILLATION ABLATION N/A 04/05/2017   Procedure: ATRIAL FIBRILLATION ABLATION;  Surgeon: Thompson Grayer, MD;  Location: [facility];  Service: Cardiovascular;  Laterality: N/A;  . EVALUATION UNDER ANESTHESIA WITH HEMORRHOIDECTOMY N/A 02/24/2017   Procedure: EXAM UNDER ANESTHESIA WITH  HEMORRHOIDECTOMY;  Surgeon: Michael Boston, MD;  [facility];  Service: General;  Laterality: N/A;  . FLEXIBLE SIGMOIDOSCOPY N/A 04/15/2015   Procedure:  UNSEDATED FLEXIBLE  SIGMOIDOSCOPY;  Surgeon: [name], MD;  [facility];  Service: Endoscopy;  Laterality: N/A;  . KNEE ARTHROSCOPY Left ~ 2016  . 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 TONSILLECTOMY    . VARICOSE VEIN SURGERY Bilateral     ROS- all systems are reviewed and negatives except as per HPI above  Current Outpatient Medications  Medication Sig Dispense Refill  . amLODipine (NORVASC) 10 MG tablet Take 10 mg by mouth daily.     [name] b complex vitamins tablet Take 1 tablet by mouth daily.    [name] ELIQUIS 5 MG TABS tablet TAKE 1 TABLET (5 MG TOTAL) BY MOUTH TWO TIMES DAILY 60 tablet 5  . flecainide (TAMBOCOR) 50 MG tablet Take 50 mg by mouth 2 (two) times daily.    [name] gabapentin (NEURONTIN) 300 MG capsule Take 1 capsule (300 mg total) by mouth 2 (two) times daily. 180 capsule 3  . hydrochlorothiazide (MICROZIDE) 12.5 MG capsule Take 12.5 mg by mouth daily.    [name] lisinopril (PRINIVIL,ZESTRIL) 30 MG tablet Take 30 mg by mouth daily.     . metoprolol succinate (TOPROL-XL) 100 MG 24 hr tablet Take 100 mg by mouth daily. Take with or immediately following a meal.    . metoprolol succinate (TOPROL-XL) 100 MG 24 hr tablet TAKE ONE TABLET (100 MG) DAILY IN THE MORNING AND 1/2 (50 MG) TABLET DAILY IN THE EVENING 135 tablet 2  . Multiple Vitamins-Minerals (MULTIVITAMIN PO) Take 1 tablet by  mouth daily.    . psyllium (METAMUCIL) 58.6 % powder Take 1 packet by mouth every evening.     . sodium chloride (OCEAN) 0.65 % SOLN nasal spray Place 1 spray into both nostrils as needed for congestion.     No current facility-administered medications for this visit.     Physical Exam: Vitals:   03/13/18 1227  BP: 138/80  Pulse: (!) 56  SpO2: 98%  Weight: 260 lb 9.6 oz (118.2 kg)  Height: 5\' 10"  (1.778 m)    GEN- The patient is overweight appearing, alert and oriented x 3 today.   Head- normocephalic, atraumatic Eyes-  Sclera clear, conjunctiva pink Ears- hearing intact Oropharynx- clear Lungs-  Clear to ausculation bilaterally, normal work of breathing Heart- Regular rate and rhythm, no murmurs, rubs or gallops, PMI not laterally displaced GI- soft, NT, ND, + BS Extremities- no clubbing, cyanosis, or edema  Wt Readings from Last 3 Encounters:  03/13/18 260 lb 9.6 oz (118.2 kg)  01/12/18 260 lb 12.8 oz (118.3 kg)  01/03/18 263 lb 12.8 oz (119.7 kg)    EKG tracing ordered today is personally reviewed and shows sinus rhythm 56 bpm, PR 196 msec, QRS 92 msec, Qtc 441 msec  Assessment and Plan:  1. Paroxysmal atrial fibrillation Well controlled post ablation.  He has been reluctant to stop flecainide.  I have encouraged him to stop flecainide today.  Reduce toprol to 50mg  daily. We have discussed lifestyle modification again today chads2vasc score is 2.  Continue eliquis.  2. OSA Compliant with CPAP  3. HTN Stable No change required today  4. Obesity Body mass index is 37.39 kg/m. Lifestyle modification encouraged  5. ETOH Avoidance encouraged  Return to see Butch Penny in AF clinic in 6 months I will see in a year  Thompson Grayer MD, [facility] 03/13/2018 12:30 PM

## 2018-03-29 ENCOUNTER — Ambulatory Visit (INDEPENDENT_AMBULATORY_CARE_PROVIDER_SITE_OTHER): Payer: 59 | Admitting: Podiatry

## 2018-03-29 DIAGNOSIS — M722 Plantar fascial fibromatosis: Secondary | ICD-10-CM

## 2018-03-29 DIAGNOSIS — Z86718 Personal history of other venous thrombosis and embolism: Secondary | ICD-10-CM | POA: Insufficient documentation

## 2018-03-29 DIAGNOSIS — M1990 Unspecified osteoarthritis, unspecified site: Secondary | ICD-10-CM | POA: Insufficient documentation

## 2018-03-29 DIAGNOSIS — K449 Diaphragmatic hernia without obstruction or gangrene: Secondary | ICD-10-CM | POA: Insufficient documentation

## 2018-03-29 DIAGNOSIS — Y95 Nosocomial condition: Secondary | ICD-10-CM | POA: Insufficient documentation

## 2018-03-29 DIAGNOSIS — J189 Pneumonia, unspecified organism: Secondary | ICD-10-CM | POA: Insufficient documentation

## 2018-03-29 DIAGNOSIS — K219 Gastro-esophageal reflux disease without esophagitis: Secondary | ICD-10-CM | POA: Insufficient documentation

## 2018-03-29 DIAGNOSIS — I809 Phlebitis and thrombophlebitis of unspecified site: Secondary | ICD-10-CM | POA: Insufficient documentation

## 2018-03-29 MED ORDER — TRIAMCINOLONE ACETONIDE 10 MG/ML IJ SUSP
10.0000 mg | Freq: Once | INTRAMUSCULAR | Status: AC
  Administered 2018-03-29: 10 mg

## 2018-03-29 NOTE — Progress Notes (Signed)
Subjective:   Patient ID: Travis [name], male   DOB: 67 y.o.   MRN: [id number]   HPI Patient states that heel is still hurting a lot and is worse when he gets up in the morning and after periods of sitting   ROS      Objective:  Physical Exam  Neurovascular status intact with exquisite discomfort plantar aspect left heel still noted in the center of the heel with one spot but still very tender with palpation     Assessment:  Acute plantar fasciitis left heel that is not responded so far it is worse after periods of sitting or inactivity     Plan:  Discussed continued acute fasciitis and today I reinjected the plantar fascia 3 mg Kenalog 5 Milgram Xylocaine and dispensed night splint to lift up the arch and to stretch the foot and he will sleep in it for the next several weeks.  Reappoint 3 weeks or earlier if needed

## 2018-03-29 NOTE — Patient Instructions (Signed)

## 2018-04-05 ENCOUNTER — Other Ambulatory Visit: Payer: Self-pay

## 2018-04-05 DIAGNOSIS — D472 Monoclonal gammopathy: Secondary | ICD-10-CM

## 2018-04-05 DIAGNOSIS — C9000 Multiple myeloma not having achieved remission: Secondary | ICD-10-CM

## 2018-04-05 NOTE — Progress Notes (Signed)
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 with above-mentioned history of smoldering myeloma who is here for 44-monthfollow-up with blood work done recently. He presents to the clinic today with his wife and notes he has been feeling fine. He denies fatigue, SOB, or aches in bones. 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 He notes he has been walking more frequently. His most recent labs show Hg 15.7, WBC 9.1, platelets 213. His wife notes he has more energy but has been using a heating pad for back pain.    REVIEW OF SYSTEMS:   Constitutional: Denies fevers, chills or abnormal weight loss Eyes: Denies blurriness of vision Ears, nose, mouth, throat, and face: Denies mucositis or sore throat Respiratory: Denies cough, dyspnea or wheezes Cardiovascular: Denies palpitation, chest discomfort Gastrointestinal:  Denies nausea, heartburn or change in bowel habits Skin: Denies abnormal skin rashes MSK: (+) back pain Lymphatics: Denies new lymphadenopathy or easy bruising Neurological: Denies tingling or new weaknesses (+) neuropathy in feet Behavioral/Psych: Mood is  stable, no new changes  Extremities: No lower extremity edema All other systems were reviewed with the patient and are negative.  I have reviewed the past medical history, past surgical history, social history and family history with the patient and they are unchanged from previous note.  ALLERGIES:  has No Known Allergies.  MEDICATIONS:  Current Outpatient Medications  Medication Sig Dispense Refill  . amLODipine (NORVASC) 10 MG tablet Take 10 mg by mouth daily.     .Marland Kitchenb complex vitamins tablet Take 1 tablet by mouth daily.    .Marland KitchenELIQUIS 5 MG TABS tablet TAKE 1 TABLET (5 MG TOTAL) BY MOUTH TWO TIMES DAILY 60 tablet 5  . flecainide (TAMBOCOR) 50 MG tablet   6  . gabapentin (NEURONTIN) 300 MG capsule Take 1 capsule (300 mg total) by mouth 2 (two) times daily. 180 capsule 3  . hydrochlorothiazide (MICROZIDE) 12.5 MG capsule Take 12.5 mg by mouth daily.    .Marland Kitchenlisinopril (PRINIVIL,ZESTRIL) 30 MG tablet Take 30 mg by mouth daily.     . metoprolol succinate (TOPROL-XL) 50 MG 24 hr tablet Take 1 tablet (50 mg total) by mouth daily. Take with or immediately following a meal. 90 tablet 3  . Multiple Vitamins-Minerals (MULTIVITAMIN PO) Take 1 tablet by mouth daily.    . psyllium (METAMUCIL) 58.6 % powder Take 1 packet by mouth every evening.     . sodium chloride (OCEAN) 0.65 % SOLN nasal spray Place 1 spray into both nostrils as needed for congestion.     No current facility-administered medications for this visit.     PHYSICAL EXAMINATION:  ECOG PERFORMANCE STATUS: 1 - Symptomatic but completely ambulatory  Vitals:   04/06/18 1438  BP: (!) 147/73  Pulse: (!) 57  Resp: 17  Temp: 98.6 F (37 C)  SpO2: 95%   Filed Weights   04/06/18 1438  Weight: 265 lb 12.8 oz (120.6 kg)    GENERAL:alert, no distress and comfortable SKIN: skin color, texture, turgor are normal, no rashes or significant lesions EYES: normal, Conjunctiva are pink and non-injected, sclera clear OROPHARYNX:no exudate, no  erythema and lips, buccal mucosa, and tongue normal  NECK: supple, thyroid normal size, non-tender, without nodularity LYMPH:  no palpable lymphadenopathy in the cervical, axillary or inguinal LUNGS: clear to auscultation and percussion with normal breathing effort HEART: regular rate & rhythm and no murmurs and no lower extremity edema ABDOMEN:abdomen soft, non-tender and normal bowel sounds MUSCULOSKELETAL:no cyanosis of digits and no clubbing  NEURO: alert & oriented x 3 with fluent speech, no focal motor/sensory deficits EXTREMITIES: No lower extremity edema  LABORATORY DATA:  I have reviewed the data as listed CMP Latest Ref Rng & Units 12/27/2017 12/27/2017 09/13/2017  Glucose 70 - 99 mg/dL 107(H) 95 108  BUN 8 - 23 mg/dL _0 Creatinine 0.61 - 1.24 mg/dL 0.91 0.89 0.91  Sodium 135 - 145 mmol/L 143 141 140  Potassium 3.5 - 5.1 mmol/L 3.8 3.7 3.7  Chloride 98 - 111 mmol/L 103 104 104  CO2 22 - 32 mmol/L _1 Calcium 8.9 - 10.3 mg/dL 9.5 9.6 9.3  Total Protein 6.5 - 8.1 g/dL - 7.0 6.8  Total Bilirubin 0.3 - 1.2 mg/dL - 0.4 0.4  Alkaline Phos 38 - 126 U/L - 83 98  AST 15 - 41 U/L - 30 28  ALT 0 - 44 U/L - 45(H) 39    Lab Results  Component Value Date   WBC 9.1 04/06/2018   HGB 15.7 04/06/2018   HCT 44.9 04/06/2018   MCV 91.4 04/06/2018   PLT 213 04/06/2018   NEUTROABS 6.6 04/06/2018    ASSESSMENT & PLAN:  Smoldering multiple myeloma ([place]) Lab review 12/29/2017: 1.  Urine free kappa: 1530 (decreased from 1780), kappa lambda ratio 328 (increased from 150) 2. IgG 526, IgA 544, M protein 0.6 (IgA kappa), it was previously 0.5 g 3.  Serum kappa 122, ratio 14.88 4.  Beta-2 microglobulin 1.6, LDH 196, creatinine 0.89, calcium 9.6 5.  Hemoglobin 14.7, WBC 8.2, ANC 5.5  Lab review 04/06/2018: Hemoglobin 15.7, platelets 213, WBC 9.1 Serum protein electrophoresis and immunofixation, kappa lambda ratio are pending  No signs of progression of disease Continue watchful  monitoring with recheck in 3 months with labs done ahead of time and follow-up     No orders of the defined types were placed in this encounter.  The patient has a good understanding of the overall plan. he agrees with it. he will call with any problems that may develop before the next visit here.  Nicholas Lose, MD 04/06/2018   I, Cloyde Reams Dorshimer, am acting as scribe for Nicholas Lose, MD.  I have reviewed the above documentation for accuracy and completeness, and I agree with the above.

## 2018-04-06 ENCOUNTER — Telehealth: Payer: Self-pay | Admitting: Hematology and Oncology

## 2018-04-06 ENCOUNTER — Inpatient Hospital Stay (HOSPITAL_BASED_OUTPATIENT_CLINIC_OR_DEPARTMENT_OTHER): Payer: 59 | Admitting: Hematology and Oncology

## 2018-04-06 ENCOUNTER — Inpatient Hospital Stay: Payer: 59 | Attending: Hematology and Oncology

## 2018-04-06 DIAGNOSIS — Z79899 Other long term (current) drug therapy: Secondary | ICD-10-CM | POA: Insufficient documentation

## 2018-04-06 DIAGNOSIS — M722 Plantar fascial fibromatosis: Secondary | ICD-10-CM | POA: Insufficient documentation

## 2018-04-06 DIAGNOSIS — C9000 Multiple myeloma not having achieved remission: Secondary | ICD-10-CM | POA: Insufficient documentation

## 2018-04-06 DIAGNOSIS — Z7901 Long term (current) use of anticoagulants: Secondary | ICD-10-CM

## 2018-04-06 DIAGNOSIS — D472 Monoclonal gammopathy: Secondary | ICD-10-CM

## 2018-04-06 DIAGNOSIS — G629 Polyneuropathy, unspecified: Secondary | ICD-10-CM | POA: Diagnosis not present

## 2018-04-06 LAB — CBC WITH DIFFERENTIAL (CANCER CENTER ONLY)
Abs Immature Granulocytes: 0.03 10*3/uL (ref 0.00–0.07)
Basophils Absolute: 0 10*3/uL (ref 0.0–0.1)
Basophils Relative: 0 %
Eosinophils Absolute: 0.1 10*3/uL (ref 0.0–0.5)
Eosinophils Relative: 1 %
HCT: 44.9 % (ref 39.0–52.0)
Hemoglobin: 15.7 g/dL (ref 13.0–17.0)
Immature Granulocytes: 0 %
Lymphocytes Relative: 16 %
Lymphs Abs: 1.5 10*3/uL (ref 0.7–4.0)
MCH: 32 pg (ref 26.0–34.0)
MCHC: 35 g/dL (ref 30.0–36.0)
MCV: 91.4 fL (ref 80.0–100.0)
MONO ABS: 0.9 10*3/uL (ref 0.1–1.0)
Monocytes Relative: 10 %
Neutro Abs: 6.6 10*3/uL (ref 1.7–7.7)
Neutrophils Relative %: 73 %
PLATELETS: 213 10*3/uL (ref 150–400)
RBC: 4.91 MIL/uL (ref 4.22–5.81)
RDW: 13.1 % (ref 11.5–15.5)
WBC Count: 9.1 10*3/uL (ref 4.0–10.5)
nRBC: 0 % (ref 0.0–0.2)

## 2018-04-06 LAB — CMP (CANCER CENTER ONLY)
ALT: 35 U/L (ref 0–44)
AST: 24 U/L (ref 15–41)
Albumin: 3.8 g/dL (ref 3.5–5.0)
Alkaline Phosphatase: 101 U/L (ref 38–126)
Anion gap: 10 (ref 5–15)
BUN: 18 mg/dL (ref 8–23)
CO2: 26 mmol/L (ref 22–32)
Calcium: 9.2 mg/dL (ref 8.9–10.3)
Chloride: 106 mmol/L (ref 98–111)
Creatinine: 1.05 mg/dL (ref 0.61–1.24)
GFR, Est AFR Am: >60 mL/min (ref >60)
GFR, Estimated: >60 mL/min (ref >60)
Glucose, Bld: 126 mg/dL — ABNORMAL HIGH (ref 70–99)
Potassium: 3.6 mmol/L (ref 3.5–5.1)
Sodium: 142 mmol/L (ref 135–145)
Total Bilirubin: 0.4 mg/dL (ref 0.3–1.2)
Total Protein: 7.1 g/dL (ref 6.5–8.1)

## 2018-04-06 LAB — LACTATE DEHYDROGENASE: LDH: 170 U/L (ref 98–192)

## 2018-04-06 NOTE — Telephone Encounter (Signed)
Patient decline avs and calendar °

## 2018-04-06 NOTE — Assessment & Plan Note (Signed)
Lab review 12/29/2017: 1.  Urine free kappa: 1530 (decreased from 1780), kappa lambda ratio 328 (increased from 150) 2. IgG 526, IgA 544, M protein 0.6 (IgA kappa), it was previously 0.5 g 3.  Serum kappa 122, ratio 14.88 4.  Beta-2 microglobulin 1.6, LDH 196, creatinine 0.89, calcium 9.6 5.  Hemoglobin 14.7, WBC 8.2, ANC 5.5  Lab review 04/06/2018:  There once again no signs of progression of disease Continue watchful monitoring with recheck in 6 months with labs done ahead of time and follow-up

## 2018-04-07 LAB — BETA 2 MICROGLOBULIN, SERUM: BETA 2 MICROGLOBULIN: 1.2 mg/L (ref 0.6–2.4)

## 2018-04-07 LAB — KAPPA/LAMBDA LIGHT CHAINS
KAPPA, LAMDA LIGHT CHAIN RATIO: 17.43 — AB (ref 0.26–1.65)
Kappa free light chain: 191.7 mg/L — ABNORMAL HIGH (ref 3.3–19.4)
Lambda free light chains: 11 mg/L (ref 5.7–26.3)

## 2018-04-10 ENCOUNTER — Inpatient Hospital Stay: Payer: 59

## 2018-04-10 ENCOUNTER — Other Ambulatory Visit: Payer: Self-pay | Admitting: Hematology and Oncology

## 2018-04-10 DIAGNOSIS — C9000 Multiple myeloma not having achieved remission: Secondary | ICD-10-CM

## 2018-04-10 DIAGNOSIS — D472 Monoclonal gammopathy: Secondary | ICD-10-CM

## 2018-04-12 LAB — MULTIPLE MYELOMA PANEL, SERUM
ALBUMIN SERPL ELPH-MCNC: 3.7 g/dL (ref 2.9–4.4)
ALPHA 1: 0.2 g/dL (ref 0.0–0.4)
Albumin/Glob SerPl: 1.5 (ref 0.7–1.7)
Alpha2 Glob SerPl Elph-Mcnc: 0.6 g/dL (ref 0.4–1.0)
B-Globulin SerPl Elph-Mcnc: 1.4 g/dL — ABNORMAL HIGH (ref 0.7–1.3)
Gamma Glob SerPl Elph-Mcnc: 0.4 g/dL (ref 0.4–1.8)
Globulin, Total: 2.6 g/dL (ref 2.2–3.9)
IgA: 559 mg/dL — ABNORMAL HIGH (ref 61–437)
IgG (Immunoglobin G), Serum: 526 mg/dL — ABNORMAL LOW (ref 700–1600)
IgM (Immunoglobulin M), Srm: 26 mg/dL (ref 20–172)
M Protein SerPl Elph-Mcnc: 0.5 g/dL — ABNORMAL HIGH
Total Protein ELP: 6.3 g/dL (ref 6.0–8.5)

## 2018-04-17 ENCOUNTER — Telehealth: Payer: Self-pay | Admitting: Hematology and Oncology

## 2018-04-17 NOTE — Telephone Encounter (Signed)
I left a message with the patient that there is a serum protein after pheresis showed M protein of 0.5 g which is down from 0.6 g. Continue to watch and monitor with 36-month follow-up with labs.

## 2018-05-25 NOTE — Progress Notes (Signed)
Follow-up Visit   Date: 05/26/18    Travis [name] MRN: [id number] DOB: May 18, 1950   Interim History: Travis [name] is a 69 y.o. right-handed Caucasian male with atrial fibrillation s/p ablation (on eliquis), OSA on CPAP, GERD, and hypertension returning to the clinic for follow-up of neuropathy.  The patient was accompanied to the clinic by self.  History of present illness: Starting around 2010, he noticed numbness and tingling over the left foot which over the past year spread into the right.  Symptoms are constant and involve both feet and left lower leg. He denies any weakness or back pain.  He has noticed that his left leg feels heavy after prolonged activity, such as playing golf.  He has not had any falls and denies imbalance.  No identifiable triggers, exacerbating factors, or alleviating factors.  He recalls having history of laser vein surgery over the left thigh about 8 months prior to symptom onset.    He usually drink 1.5 glasses of wine nightly for many years.  No personal history of diabetes or family history of neuropathy.   He had NCS/EMG performed by Dr. Domingo Cocking which was reportedly normal.  His diabetes screening has been normal.  He has remote history of vitamin B12 deficiency treated with oral supplementation.  UPDATE 08/29/2017: .  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  He has been seeing Dr. 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  Around February, he began having worsening burning and achy pain over the toes.  He does not have imbalance or weakness.  For his pain, gabapentin 200mg  TID was started which has significantly alleviated his pain.   UPDATE 05/26/2018:  He is here for 6 month follow-up.  There has been no significant change in his neuropathy which continues to be well-controlled on gabapentin 300mg  twice  daily.  He had his follow-up at [facility] hematology last week for myeloma, which remains quiet. He has not suffered any falls and reports balance is good.  In the shower, he sometimes gets unsteady when washing his face.  He walks unassisted.  He has been having issues with left plantar fasciitis and seeing Dr. Paulla Dolly for this.     Medications:  Current Outpatient Medications on File Prior to Visit  Medication Sig Dispense Refill  . amLODipine (NORVASC) 10 MG tablet Take 10 mg by mouth daily.     [name] b complex vitamins tablet Take 1 tablet by mouth daily.    . cyanocobalamin (,VITAMIN B-12,) 1000 MCG/ML injection Inject into the muscle.    [name] ELIQUIS 5 MG TABS tablet TAKE 1 TABLET (5 MG TOTAL) BY MOUTH TWO TIMES DAILY 60 tablet 5  . gabapentin (NEURONTIN) 300 MG capsule Take 1 capsule (300 mg total) by mouth 2 (two) times daily. 180 capsule 3  . hydrochlorothiazide (MICROZIDE) 12.5 MG capsule Take 12.5 mg by mouth daily.    [name] lisinopril (PRINIVIL,ZESTRIL) 30 MG tablet Take 30 mg by mouth daily.     . metoprolol succinate (TOPROL-XL) 50 MG 24 hr tablet Take 1 tablet (50 mg total) by mouth daily. Take with or immediately following a meal. 90 tablet 3  . Multiple Vitamins-Minerals (MULTIVITAMIN PO) Take 1 tablet by mouth daily.    . psyllium (METAMUCIL) 58.6 % powder Take 1 packet by mouth every evening.     . sodium chloride (OCEAN)  0.65 % SOLN nasal spray Place 1 spray into both nostrils as needed for congestion.    . Vitamin D, Ergocalciferol, (DRISDOL) 1.25 MG (50000 UT) CAPS capsule Take by mouth.     No current facility-administered medications on file prior to visit.     Allergies: No Known Allergies  Review of Systems:  CONSTITUTIONAL: No fevers, chills, night sweats, or weight loss.  EYES: No visual changes or eye pain ENT: No hearing changes.  No history of nose bleeds.   RESPIRATORY: No cough, wheezing and shortness of breath.   CARDIOVASCULAR: Negative for chest pain, and palpitations.    GI: Negative for abdominal discomfort, blood in stools or black stools.  No recent change in bowel habits.   GU:  No history of incontinence.   MUSCLOSKELETAL: No history of joint pain or swelling.  No myalgias.   SKIN: Negative for lesions, rash, and itching.   ENDOCRINE: Negative for cold or heat intolerance, polydipsia or goiter.   PSYCH:  No depression or anxiety symptoms.   NEURO: As Above.   Vital Signs:  BP 130/80   Pulse 60   Ht 5\' 10"  (1.778 m)   Wt 271 lb 6 oz (123.1 kg)   SpO2 96%   BMI 38.94 kg/m   General Medical Exam:   General:  Well appearing, comfortable  Eyes/ENT: see cranial nerve examination.   Neck: No masses appreciated.  Full range of motion without tenderness.  No carotid bruits. Respiratory:  Clear to auscultation, good air entry bilaterally.   Cardiac:  Regular rate and rhythm, no murmur.   Ext:  No edema  Neurological Exam: MENTAL STATUS including orientation to time, place, person, recent and remote memory, attention span and concentration, language, and fund of knowledge is normal.  Speech is not dysarthric.  CRANIAL NERVES:  Face is symmetric  MOTOR:  Motor strength is 5/5 in all extremities, including distally.   No pronator drift.  Tone is normal.    MSRs:  Reflexes are 2+/4 throughout, except absent Achilles bilaterally.  SENSORY:  Absent vibration distal to ankles bilaterally.   COORDINATION/GAIT:   Gait narrow based and stable.   Data: Labs 05/13/2017:  SPEP with IFE IGA KAPPA MONOCLONAL PROTEIN, TSH 1.54, MMA 156, folate 20.7, copper 103, vitamin B1 34, vitamin B12 329  MRI lumbar spine 11/23/2017: 1. Lumbar spondylosis and degenerative disc disease causing mild impingement at L4-5 and L5-S1. 2. There is some marrow heterogeneity but no focal enhancing lesion characteristic of lumbar myeloma is identified. 3. Mild levoconvex lumbar scoliosis.  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  - Continue gabapentin 300mg  BID which is effective at pain control  - Recommend adding hand rails in the shower  - Fall precautions discussed  - If symptoms get worse, proceed with NCS/EMG of the legs  Return to clinic in 6 months  Thank you for allowing me to participate in patient's care.  If I can answer any additional questions, I would be pleased to do so.    Sincerely,    Donika K. Posey Pronto, DO

## 2018-05-26 ENCOUNTER — Encounter: Payer: Self-pay | Admitting: Neurology

## 2018-05-26 ENCOUNTER — Ambulatory Visit: Payer: Medicare Other | Admitting: Neurology

## 2018-05-26 VITALS — BP 130/80 | HR 60 | Ht 70.0 in | Wt 271.4 lb

## 2018-05-26 DIAGNOSIS — G629 Polyneuropathy, unspecified: Secondary | ICD-10-CM | POA: Diagnosis not present

## 2018-05-26 NOTE — Patient Instructions (Signed)
Return to clinic in 6 months.

## 2018-05-29 ENCOUNTER — Ambulatory Visit: Payer: Medicare Other | Admitting: Podiatry

## 2018-05-29 ENCOUNTER — Encounter: Payer: Self-pay | Admitting: Podiatry

## 2018-05-29 DIAGNOSIS — E349 Endocrine disorder, unspecified: Secondary | ICD-10-CM

## 2018-05-29 DIAGNOSIS — M722 Plantar fascial fibromatosis: Secondary | ICD-10-CM | POA: Diagnosis not present

## 2018-05-29 DIAGNOSIS — G63 Polyneuropathy in diseases classified elsewhere: Secondary | ICD-10-CM

## 2018-05-29 MED ORDER — TRIAMCINOLONE ACETONIDE 10 MG/ML IJ SUSP
10.0000 mg | Freq: Once | INTRAMUSCULAR | Status: AC
  Administered 2018-05-29: 10 mg

## 2018-05-31 NOTE — Progress Notes (Signed)
Subjective:   Patient ID: Travis [name], male   DOB: 68 y.o.   MRN: [id number]   HPI Patient continues to have pain in the left heel and states it is worse when he gets up or after sitting and it does not seem so far to have responded conservatively   ROS      Objective:  Physical Exam  Neurovascular status intact with discomfort plantar left heel which is still present upon palpation and states it is worse when he tries to be on his foot or tries to do any form of activity     Assessment:  Continued acute plantar fasciitis left with inflammation fluid buildup     Plan:  Discussed ultimately this may require surgery but we are trying to do everything we can to avoid this and I did go ahead today and I did sterile prep and then injected the fascia 3 mg Kenalog 5 g Xylocaine and applied air fracture walker to completely immobilize.  Reappoint to recheck 4 weeks or earlier if needed

## 2018-06-09 ENCOUNTER — Telehealth: Payer: Self-pay | Admitting: *Deleted

## 2018-06-09 ENCOUNTER — Telehealth: Payer: Self-pay

## 2018-06-09 NOTE — Telephone Encounter (Signed)
"  Travis [name] [phone]).  I'm having increased bone pain from this smoldering myeloma.    Bone pain getting worse the last two weeks.  Pain is primarily to rt hip, lower back and legs.  Back pain is now moving up my back.    I know I have an appointment in March but I'd like to be seen today or sometime next week before I leave the country February 15th.      Early January, [facility] did lab work.  This may help Dr. Lindi Adie figure out what's going on.    No numbness, tingling.    No trouble walking except for boot I'm wearing for plantar fascitis to left foot.  Bowel troubles of constipation in the mornings the last couple days.  By end of each day bowels move without any problems.    I've not done strenuous activities or injured myself.    Feel pain throughout the day.  Causing trouble getting to sleep.    Tylenol doesn't help.  I have Oxycodone 5 mg left over from surgery I had a year and a half ago.  One Oxycodone tablet helps.  So far I've used three Oxycodone with about four pills left."

## 2018-06-09 NOTE — Telephone Encounter (Signed)
Nurse followed up from triage message.  Pt requesting to see Dr. Lindi Adie d/t increase in bone pain.    Pt reports wanting to review diagnoses and options with provider.  Pain is controlled currently with pain medication that he has available at home.  Appointment scheduled per pt request.

## 2018-06-14 ENCOUNTER — Ambulatory Visit: Payer: 59 | Admitting: Hematology and Oncology

## 2018-06-15 ENCOUNTER — Encounter: Payer: Self-pay | Admitting: Podiatry

## 2018-06-15 ENCOUNTER — Ambulatory Visit: Payer: Medicare Other | Admitting: Podiatry

## 2018-06-15 DIAGNOSIS — M722 Plantar fascial fibromatosis: Secondary | ICD-10-CM

## 2018-06-15 NOTE — Progress Notes (Signed)
Subjective:   Patient ID: Travis [name], male   DOB: 68 y.o.   MRN: [id number]   HPI Patient states his heels starting to feel much better and is getting ready to leave for [place] on Saturday   ROS      Objective:  Physical Exam  Neurovascular status intact with patient's left foot improved with pain present upon deep palpation but overall much better than previous     Assessment:  Patient appears to be responding to complete immobilization left     Plan:  Reviewed at great length immobilization and the gradual introduction of normal shoe gear we reviewed different techniques for him to follow.  Patient will be seen back for [place] to recheck as symptoms indicate and he will take his boot it only with him and is advised to call with any questions he may have

## 2018-06-28 ENCOUNTER — Other Ambulatory Visit: Payer: Self-pay

## 2018-06-28 ENCOUNTER — Telehealth: Payer: Self-pay | Admitting: Physician Assistant

## 2018-06-28 DIAGNOSIS — D472 Monoclonal gammopathy: Secondary | ICD-10-CM

## 2018-06-28 DIAGNOSIS — C9000 Multiple myeloma not having achieved remission: Secondary | ICD-10-CM

## 2018-06-28 NOTE — Telephone Encounter (Signed)
Please call patient. He was scheduled to see me in a couple of weeks. I received a note from the scheduler that he was following up from going to the ED in [place] while he was visiting there.  He has not seen [name] since 2018.  He has mainly been followed by Dr. Rayann Heman and Roderic Palau, NP [facility] since then.    #Ask patient why he went to the ED in [place] so we can determine if the appointment with me is appropriate or if we should arrange a follow up with Dr. Rayann Heman or Butch Penny.  Thanks, Travis Dopp, PA-C    06/28/2018 5:21 PM

## 2018-06-29 ENCOUNTER — Inpatient Hospital Stay: Payer: Medicare Other | Attending: Hematology and Oncology

## 2018-06-29 DIAGNOSIS — C9000 Multiple myeloma not having achieved remission: Secondary | ICD-10-CM | POA: Diagnosis not present

## 2018-06-29 DIAGNOSIS — D472 Monoclonal gammopathy: Secondary | ICD-10-CM

## 2018-06-29 LAB — CBC WITH DIFFERENTIAL (CANCER CENTER ONLY)
Abs Immature Granulocytes: 0.03 10*3/uL (ref 0.00–0.07)
BASOS ABS: 0 10*3/uL (ref 0.0–0.1)
Basophils Relative: 0 %
Eosinophils Absolute: 0.2 10*3/uL (ref 0.0–0.5)
Eosinophils Relative: 2 %
HCT: 42.7 % (ref 39.0–52.0)
Hemoglobin: 14.9 g/dL (ref 13.0–17.0)
Immature Granulocytes: 0 %
Lymphocytes Relative: 18 %
Lymphs Abs: 1.7 10*3/uL (ref 0.7–4.0)
MCH: 32 pg (ref 26.0–34.0)
MCHC: 34.9 g/dL (ref 30.0–36.0)
MCV: 91.6 fL (ref 80.0–100.0)
Monocytes Absolute: 1 10*3/uL (ref 0.1–1.0)
Monocytes Relative: 11 %
Neutro Abs: 6.3 10*3/uL (ref 1.7–7.7)
Neutrophils Relative %: 69 %
Platelet Count: 278 10*3/uL (ref 150–400)
RBC: 4.66 MIL/uL (ref 4.22–5.81)
RDW: 12.7 % (ref 11.5–15.5)
WBC: 9.2 10*3/uL (ref 4.0–10.5)
nRBC: 0 % (ref 0.0–0.2)

## 2018-06-29 LAB — CMP (CANCER CENTER ONLY)
ALT: 45 U/L — ABNORMAL HIGH (ref 0–44)
AST: 29 U/L (ref 15–41)
Albumin: 3.9 g/dL (ref 3.5–5.0)
Alkaline Phosphatase: 90 U/L (ref 38–126)
Anion gap: 12 (ref 5–15)
BUN: 13 mg/dL (ref 8–23)
CO2: 27 mmol/L (ref 22–32)
Calcium: 9.3 mg/dL (ref 8.9–10.3)
Chloride: 102 mmol/L (ref 98–111)
Creatinine: 0.94 mg/dL (ref 0.61–1.24)
GFR, Est AFR Am: >60 mL/min (ref >60)
GFR, Estimated: >60 mL/min (ref >60)
Glucose, Bld: 101 mg/dL — ABNORMAL HIGH (ref 70–99)
Potassium: 3.5 mmol/L (ref 3.5–5.1)
SODIUM: 141 mmol/L (ref 135–145)
Total Bilirubin: 0.4 mg/dL (ref 0.3–1.2)
Total Protein: 7.1 g/dL (ref 6.5–8.1)

## 2018-06-29 LAB — LACTATE DEHYDROGENASE: LDH: 218 U/L — AB (ref 98–192)

## 2018-06-29 NOTE — Telephone Encounter (Signed)
Spoke with pt who states that he went to the ED in [place] because before he left for his trip he had a virus with a cough. Martin Majestic he got to [place] pt says he didn't feel right and went to the ED and he thinks he was dehydrated but had pain in his shoulders as well. Pt states that the ED told him that everything checked out normal but wanted him to see his cardiologist when he got back home.  Pt states that he does have records from the ED in [place] and he will fax them over to our office.

## 2018-06-29 NOTE — Telephone Encounter (Signed)
Ok. Keep appointment with me as planned. Richardson Dopp, PA-C    06/29/2018 8:53 PM

## 2018-06-30 LAB — KAPPA/LAMBDA LIGHT CHAINS
KAPPA, LAMDA LIGHT CHAIN RATIO: 15.18 — AB (ref 0.26–1.65)
Kappa free light chain: 112.3 mg/L — ABNORMAL HIGH (ref 3.3–19.4)
Lambda free light chains: 7.4 mg/L (ref 5.7–26.3)

## 2018-06-30 LAB — BETA 2 MICROGLOBULIN, SERUM: Beta-2 Microglobulin: 1.5 mg/L (ref 0.6–2.4)

## 2018-06-30 NOTE — Telephone Encounter (Signed)
Made pt aware to keep appt with Richardson Dopp, PA-C on 3/11 @ 3:15pm. Pt verbalized understanding and thanked me for the call.

## 2018-07-03 LAB — MULTIPLE MYELOMA PANEL, SERUM
Albumin SerPl Elph-Mcnc: 3.6 g/dL (ref 2.9–4.4)
Albumin/Glob SerPl: 1.3 (ref 0.7–1.7)
Alpha 1: 0.2 g/dL (ref 0.0–0.4)
Alpha2 Glob SerPl Elph-Mcnc: 0.7 g/dL (ref 0.4–1.0)
B-Globulin SerPl Elph-Mcnc: 1.5 g/dL — ABNORMAL HIGH (ref 0.7–1.3)
GAMMA GLOB SERPL ELPH-MCNC: 0.4 g/dL (ref 0.4–1.8)
Globulin, Total: 2.8 g/dL (ref 2.2–3.9)
IgA: 529 mg/dL — ABNORMAL HIGH (ref 61–437)
IgG (Immunoglobin G), Serum: 523 mg/dL — ABNORMAL LOW (ref 700–1600)
IgM (Immunoglobulin M), Srm: 27 mg/dL (ref 20–172)
M Protein SerPl Elph-Mcnc: 0.6 g/dL — ABNORMAL HIGH
Total Protein ELP: 6.4 g/dL (ref 6.0–8.5)

## 2018-07-05 NOTE — Progress Notes (Signed)
Patient Care Team: Lajean Manes, MD as PCP - General (Internal Medicine) Michael Boston, MD as Consulting Physician (General Surgery) Clarene Essex, MD as Consulting Physician (Gastroenterology) Fay Records, MD as Consulting Physician (Cardiology) Sueanne Margarita, MD as Consulting Physician (Sleep Medicine)  DIAGNOSIS:    ICD-10-CM   1. Smoldering multiple myeloma (HCC) C90.00 Beta 2 microglobulin, serum    Kappa/lambda light chains    Multiple Myeloma Panel (SPEP&IFE w/QIG)    CBC with Differential (Cancer Center Only)    CMP ([place] only)    Lactate dehydrogenase (LDH)    SUMMARY OF ONCOLOGIC HISTORY:   Smoldering multiple myeloma ([place])   07/21/2017 Initial Diagnosis    Smoldering multiple myeloma ([place])    09/13/2017 Tumor Marker    tProt 6.8, Alb 3.8, Ca 9.3, Cr 0.9, AP 98 LDH 184, beta-2 microglobulin 1.4 SPEP -- M-spike 0.5g/dL, SIFE -- IgA kappa IgG 519, IgA 544, IgM 29; kappa 119.3, lambda 8.5, KLR 14.04 WBC 7.6, Hgb 14.6, Plt 208     CHIEF COMPLIANT: Follow-up of smoldering myeloma  INTERVAL HISTORY: Travis [name] is a 68 y.o. with above-mentioned history of smoldering myeloma who is here for 88-monthfollow-up with blood work. He presents to the clinic today with his wife. He reports moderate pain in his back and chest, a 4/10 and worse in the morning, and fatigue in addition to a dry cough. His light chains were elevated and B12 was [facility] and he was put on weekly Vitamin D2 and monthly B12 shots. His labs from 06/29/18 showed Hg 14.9, creatinine 0.94, calcium 9.3, LDH 218, kappa/lamda light chain ratio 15.18, IgG 523, IgA 529, M protein 0.6, beta-2 microglobulin 1.5. [place].   REVIEW OF SYSTEMS:   Constitutional: Denies fevers, chills or abnormal weight loss (+) fatigue Eyes: Denies blurriness of vision Ears, nose, mouth, throat, and face:  Denies mucositis or sore throat Respiratory: Denies dyspnea or wheezes (+) cough Cardiovascular: Denies palpitation, chest discomfort Gastrointestinal: Denies nausea, heartburn or change in bowel habits Skin: Denies abnormal skin rashes MSK: (+) back, chest pain  Lymphatics: Denies new lymphadenopathy or easy bruising Neurological: Denies numbness, tingling or new weaknesses Behavioral/Psych: Mood is stable, no new changes  Extremities: No lower extremity edema Breast: denies any pain or lumps or nodules in either breasts All other systems were reviewed with the patient and are negative.  I have reviewed the past medical history, past surgical history, social history and family history with the patient and they are unchanged from previous note.  ALLERGIES:  has No Known Allergies.  MEDICATIONS:  Current Outpatient Medications  Medication Sig Dispense Refill  . amLODipine (NORVASC) 10 MG tablet Take 10 mg by mouth daily.     .Marland Kitchenb complex vitamins tablet Take 1 tablet by mouth daily.    . cyanocobalamin (,VITAMIN B-12,) 1000 MCG/ML injection Inject into the muscle.    .Marland KitchenELIQUIS 5 MG TABS tablet TAKE 1 TABLET (5 MG TOTAL) BY MOUTH TWO TIMES DAILY 60 tablet 5  . gabapentin (NEURONTIN) 300 MG capsule Take 1 capsule (300 mg total) by mouth 2 (two) times daily. 180 capsule 3  . hydrochlorothiazide (MICROZIDE) 12.5 MG capsule Take 12.5 mg by mouth daily.    .Marland Kitchenlisinopril (PRINIVIL,ZESTRIL) 30 MG tablet Take 30 mg by mouth daily.     . metoprolol succinate (TOPROL-XL) 50 MG 24 hr tablet Take  1 tablet (50 mg total) by mouth daily. Take with or immediately following a meal. 90 tablet 3  . Multiple Vitamins-Minerals (MULTIVITAMIN PO) Take 1 tablet by mouth daily.    . psyllium (METAMUCIL) 58.6 % powder Take 1 packet by mouth every evening.     . sodium chloride (OCEAN) 0.65 % SOLN nasal spray Place 1 spray into both nostrils as needed for congestion.     No current facility-administered medications  for this visit.     PHYSICAL EXAMINATION: ECOG PERFORMANCE STATUS: 1 - Symptomatic but completely ambulatory  Vitals:   07/06/18 1517  BP: 138/72  Pulse: 64  Temp: 99.2 F (37.3 C)  SpO2: 95%   Filed Weights   07/06/18 1517  Weight: 268 lb 1.6 oz (121.6 kg)    GENERAL: alert, no distress and comfortable SKIN: skin color, texture, turgor are normal, no rashes or significant lesions EYES: normal, Conjunctiva are pink and non-injected, sclera clear OROPHARYNX: no exudate, no erythema and lips, buccal mucosa, and tongue normal  NECK: supple, thyroid normal size, non-tender, without nodularity LYMPH: no palpable lymphadenopathy in the cervical, axillary or inguinal LUNGS: clear to auscultation and percussion with normal breathing effort HEART: regular rate & rhythm and no murmurs and no lower extremity edema ABDOMEN: abdomen soft, non-tender and normal bowel sounds MUSCULOSKELETAL: no cyanosis of digits and no clubbing  NEURO: alert & oriented x 3 with fluent speech, no focal motor/sensory deficits EXTREMITIES: No lower extremity edema  LABORATORY DATA:  I have reviewed the data as listed CMP Latest Ref Rng & Units 06/29/2018 04/06/2018 12/27/2017  Glucose 70 - 99 mg/dL 101(H) 126(H) 107(H)  BUN 8 - 23 mg/dL '13 18 17  ' Creatinine 0.61 - 1.24 mg/dL 0.94 1.05 0.91  Sodium 135 - 145 mmol/L 141 142 143  Potassium 3.5 - 5.1 mmol/L 3.5 3.6 3.8  Chloride 98 - 111 mmol/L 102 106 103  CO2 22 - 32 mmol/L '27 26 28  ' Calcium 8.9 - 10.3 mg/dL 9.3 9.2 9.5  Total Protein 6.5 - 8.1 g/dL 7.1 7.1 -  Total Bilirubin 0.3 - 1.2 mg/dL 0.4 0.4 -  Alkaline Phos 38 - 126 U/L 90 101 -  AST 15 - 41 U/L 29 24 -  ALT 0 - 44 U/L 45(H) 35 -    Lab Results  Component Value Date   WBC 9.2 06/29/2018   HGB 14.9 06/29/2018   HCT 42.7 06/29/2018   MCV 91.6 06/29/2018   PLT 278 06/29/2018   NEUTROABS 6.3 06/29/2018    ASSESSMENT & PLAN:  Smoldering multiple myeloma ([place]) Lab review 06/29/2018:  Creatinine 0.94, calcium 9.3, hemoglobin 14.9, beta-2 microglobulin 1.5 Kappa: 112 (was 191) Lambda: 7.4, ratio 15.18 (was 17.43) IgG 523, IgA 529 (previously 559) M protein 0.6 (was 0.5)  No signs of progression of disease Patient is being followed at [facility] by Dr. Baltazar Najjar. It is interesting to note that the kappa lambda ratio was at 30 at [facility] and 15 at our hospital. He is going to see her back in July for follow-up.  Back issues: I instructed him to see a chiropractor and work to strengthen his back with a coach or a Clinical research associate.  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  He just returned from [place] and apparently did not have any issues with pain while he was visiting [place].  I will see the patient back in November for follow-up.    Orders Placed This Encounter  Procedures  . Beta 2 microglobulin, serum  Standing Status:   Future    Standing Expiration Date:   08/10/2019  . Kappa/lambda light chains    Standing Status:   Future    Standing Expiration Date:   08/10/2019  . Multiple Myeloma Panel (SPEP&IFE w/QIG)    Standing Status:   Future    Standing Expiration Date:   08/10/2019  . CBC with Differential (Cancer Center Only)    Standing Status:   Future    Standing Expiration Date:   07/06/2019  . CMP ([facility] only)    Standing Status:   Future    Standing Expiration Date:   07/06/2019  . Lactate dehydrogenase (LDH)    Standing Status:   Future    Standing Expiration Date:   07/06/2019   The patient has a good understanding of the overall plan. he agrees with it. he will call with any problems that may develop before the next visit here.  Nicholas Lose, MD 07/06/2018  Julious Oka Dorshimer am acting as scribe for Dr. Nicholas Lose.  I have reviewed the above documentation for accuracy and completeness, and I agree with the above.

## 2018-07-06 ENCOUNTER — Inpatient Hospital Stay: Payer: Medicare Other | Attending: Hematology and Oncology | Admitting: Hematology and Oncology

## 2018-07-06 ENCOUNTER — Telehealth: Payer: Self-pay | Admitting: Hematology and Oncology

## 2018-07-06 DIAGNOSIS — M549 Dorsalgia, unspecified: Secondary | ICD-10-CM | POA: Insufficient documentation

## 2018-07-06 DIAGNOSIS — Z7901 Long term (current) use of anticoagulants: Secondary | ICD-10-CM

## 2018-07-06 DIAGNOSIS — R5383 Other fatigue: Secondary | ICD-10-CM | POA: Insufficient documentation

## 2018-07-06 DIAGNOSIS — I10 Essential (primary) hypertension: Secondary | ICD-10-CM | POA: Diagnosis not present

## 2018-07-06 DIAGNOSIS — C9000 Multiple myeloma not having achieved remission: Secondary | ICD-10-CM | POA: Insufficient documentation

## 2018-07-06 DIAGNOSIS — R05 Cough: Secondary | ICD-10-CM

## 2018-07-06 DIAGNOSIS — Z79899 Other long term (current) drug therapy: Secondary | ICD-10-CM

## 2018-07-06 DIAGNOSIS — M1990 Unspecified osteoarthritis, unspecified site: Secondary | ICD-10-CM | POA: Diagnosis not present

## 2018-07-06 DIAGNOSIS — D472 Monoclonal gammopathy: Secondary | ICD-10-CM

## 2018-07-06 NOTE — Assessment & Plan Note (Signed)
Lab review 06/29/2018: Creatinine 0.94, calcium 9.3, hemoglobin 14.9, beta-2 microglobulin 1.5 Kappa: 112 (was 191) Lambda: 7.4, ratio 15.18 (was 17.43) IgG 523, IgA 529 (previously 559) M protein 0.6 (was 0.5)  No signs of progression of disease Continue watchful monitoring with 3 follow-up in 4 months.

## 2018-07-06 NOTE — Telephone Encounter (Signed)
Patient decline avs and calendar °

## 2018-07-10 ENCOUNTER — Other Ambulatory Visit: Payer: Self-pay

## 2018-07-10 ENCOUNTER — Emergency Department (HOSPITAL_COMMUNITY): Payer: Medicare Other

## 2018-07-10 ENCOUNTER — Encounter (HOSPITAL_COMMUNITY): Payer: Self-pay | Admitting: *Deleted

## 2018-07-10 ENCOUNTER — Emergency Department (HOSPITAL_COMMUNITY)
Admission: EM | Admit: 2018-07-10 | Discharge: 2018-07-10 | Disposition: A | Discharge status: Home / Self Care | Payer: Medicare Other | Attending: Emergency Medicine | Admitting: Emergency Medicine

## 2018-07-10 DIAGNOSIS — Z79899 Other long term (current) drug therapy: Secondary | ICD-10-CM | POA: Diagnosis not present

## 2018-07-10 DIAGNOSIS — R05 Cough: Secondary | ICD-10-CM | POA: Diagnosis not present

## 2018-07-10 DIAGNOSIS — R059 Cough, unspecified: Secondary | ICD-10-CM

## 2018-07-10 DIAGNOSIS — Z86718 Personal history of other venous thrombosis and embolism: Secondary | ICD-10-CM | POA: Insufficient documentation

## 2018-07-10 DIAGNOSIS — R079 Chest pain, unspecified: Secondary | ICD-10-CM | POA: Diagnosis present

## 2018-07-10 DIAGNOSIS — I10 Essential (primary) hypertension: Secondary | ICD-10-CM | POA: Diagnosis not present

## 2018-07-10 DIAGNOSIS — I480 Paroxysmal atrial fibrillation: Secondary | ICD-10-CM | POA: Diagnosis not present

## 2018-07-10 LAB — BASIC METABOLIC PANEL
Anion gap: 9 (ref 5–15)
BUN: 20 mg/dL (ref 8–23)
CO2: 25 mmol/L (ref 22–32)
Calcium: 9 mg/dL (ref 8.9–10.3)
Chloride: 104 mmol/L (ref 98–111)
Creatinine, Ser: 0.83 mg/dL (ref 0.61–1.24)
GFR calc Af Amer: >60 mL/min (ref >60)
GFR calc non Af Amer: >60 mL/min (ref >60)
Glucose, Bld: 95 mg/dL (ref 70–99)
Potassium: 3.7 mmol/L (ref 3.5–5.1)
Sodium: 138 mmol/L (ref 135–145)

## 2018-07-10 LAB — I-STAT TROPONIN, ED
Troponin i, poc: 0 ng/mL (ref 0.00–0.08)
Troponin i, poc: 0.01 ng/mL (ref 0.00–0.08)

## 2018-07-10 LAB — CBC WITH DIFFERENTIAL/PLATELET
Abs Immature Granulocytes: 0.05 10*3/uL (ref 0.00–0.07)
Basophils Absolute: 0 10*3/uL (ref 0.0–0.1)
Basophils Relative: 0 %
Eosinophils Absolute: 0.1 10*3/uL (ref 0.0–0.5)
Eosinophils Relative: 1 %
HCT: 46.8 % (ref 39.0–52.0)
Hemoglobin: 15.8 g/dL (ref 13.0–17.0)
Immature Granulocytes: 1 %
Lymphocytes Relative: 15 %
Lymphs Abs: 1.3 10*3/uL (ref 0.7–4.0)
MCH: 31.4 pg (ref 26.0–34.0)
MCHC: 33.8 g/dL (ref 30.0–36.0)
MCV: 93 fL (ref 80.0–100.0)
Monocytes Absolute: 0.8 10*3/uL (ref 0.1–1.0)
Monocytes Relative: 10 %
Neutro Abs: 6.2 10*3/uL (ref 1.7–7.7)
Neutrophils Relative %: 73 %
Platelets: 236 10*3/uL (ref 150–400)
RBC: 5.03 MIL/uL (ref 4.22–5.81)
RDW: 12.7 % (ref 11.5–15.5)
WBC: 8.4 10*3/uL (ref 4.0–10.5)
nRBC: 0 % (ref 0.0–0.2)

## 2018-07-10 NOTE — ED Notes (Signed)
ED Provider at bedside. 

## 2018-07-10 NOTE — ED Provider Notes (Signed)
Care transferred from [place], PA-C at shift change. See not for full HPI.  In summation, 68 year old male history of hypertension, atrial fibrillation s/p ablation on anticoagulation, DVT who presents for evaluation of chest pain.  Pain began 3 weeks ago.  At that time patient was not able leave.  He was there for 1 week and was evaluated for chest pain in the emergency department at that time.  Had negative EKG and cardiac enzymes.  Subsequently discharged.  He returned 14 days ago today.  Patient did have URI symptoms prior to trip to [place] which has persisted throughout his trip.  Had chest pain since he went home and 3 separate episodes.  Most recently at 28 AM yesterday evening, 12 hours PTA.  Symptoms have been persistent since then.  Described as an aching with radiation to the shoulder.  No associated lightheadedness, dizziness, pleuritic pain, lower extremity swelling, shortness of breath patient had prior CT cardiac scan 2018 which.  Patient is followed by Dr. Rayann Heman for his atrial fibrillation.  ID was called due to possible rhinovirus exposure.  States he does not need a further work-up at this time.  He has never fever or respiratory symptoms.  From prior provider, patient with heart score 3.  Low suspicion for ACS, PE or dissection.  Labs obtained were unremarkable at that time. Patient pending delta troponin transfer. If delta trop negative plan for dc home with Cardiology F/U.  Patient does have cardiology appointment on Wednesday morning, 48 hours from current.  On reevaluation patient with significant improvement in CP. No diaphoresis, lightheadedness, dizziness, shortness of breath.. Delta troponin negative negative.  Patient to keep his appointment cardiology on 07/12/2018.  Patient hemodynamically stable and appropriate for DC home at this time.  Discussed strict return precautions with patient.  Patient voiced understanding and is agreeable for follow-up.  1. Chest pain 2. Cough  Dg  Chest 2 View  Result Date: 07/10/2018 CLINICAL DATA:  Cough and chest pain EXAM: CHEST - 2 VIEW COMPARISON:  January 12, 2018 FINDINGS: No edema or consolidation. Heart size and pulmonary vascularity are normal. No adenopathy. There is mild degenerative change in the thoracic spine. IMPRESSION: No edema or consolidation. Electronically Signed   By: Lowella Grip III M.D.   On: 07/10/2018 13:39   Labs Reviewed  CBC WITH DIFFERENTIAL/PLATELET  BASIC METABOLIC PANEL  I-STAT TROPONIN, ED  I-STAT TROPONIN, ED     Arther Heisler A, PA-C 07/10/18 1718    Drenda Freeze, MD 07/10/18 1806

## 2018-07-10 NOTE — ED Provider Notes (Signed)
[facility] EMERGENCY DEPARTMENT Provider Note   CSN: [id number] Arrival date & time: 07/10/18  1049    History   Chief Complaint Chief Complaint  Patient presents with  . Cough  . Chest Pain    HPI Travis [name] is a 68 y.o. male.     HPI   68 year old male with a past medical history of hypertension, A. fib status post ablation, smoldering multiple myeloma, GERD,  DVT presents today with complaints of chest pain.  Patient notes that approximately 3 weeks ago he went to [place].  He notes spending approximately 1 week there.  He returned 2 weeks ago today.  He notes that prior to going he had upper respiratory symptoms and had persistent cough throughout his trip.  He notes that symptoms have started to improve since coming home, worsening, denies any fever or shortness of breath.  He notes that while he was in [place] he had an episode of chest achiness left-sided radiating to his shoulder.  He was seen at their hospital where he had an EKG and enzymes performed that were read as normal.  He notes that since being home he has had 3 separate episodes.  Most recently last night around 12 AM approximately 12 hours prior to evaluation he had onset of symptoms that have remained persistent since then.  He describes this as an ache with radiation to the shoulder.  He denies any associated shortness of breath, denies any pleuritic pain, no lower extremity edema.  He notes a history of hypertension, denies history of hyperlipidemia, smoking, drug use, personal cardiac history other than atrial fibrillation, he notes his grandfather had a heart attack but no MI in his parents. Pt notes Ct cardiac Morph/pulm vein in 2018 that was normal.    Past Medical History:  Diagnosis Date  . Arthritis    "comes and goes; mostly in hands, occasionally elbow" (04/05/2017)  . Complication of anesthesia    "just wanted to sleep alot  for couple days S/P VARICOCELE OR"  (04/05/2017)  . DVT (deep venous thrombosis) (HCC)    LLE  . Dysrhythmia    PVCs   . GERD (gastroesophageal reflux disease)   . History of hiatal hernia   . Hypertension   . Impaired glucose tolerance   . Myeloma ([place])   . Obesity (BMI 30-39.9) 07/26/2016  . OSA on CPAP 07/26/2016   Mild with AHI 12.5/hr now on CPAP at 14cm H2O  . Paroxysmal atrial fibrillation (HCC)   . Pneumonia    "may have had walking pneumonia a few years ago" (04/05/2017)  . Thrombophlebitis     Patient Active Problem List   Diagnosis Date Noted  . GERD (gastroesophageal reflux disease) 03/29/2018  . Hiatal hernia 03/29/2018  . History of DVT (deep vein thrombosis) 03/29/2018  . Hospital acquired PNA 03/29/2018  . Osteoarthritis 03/29/2018  . Thrombophlebitis 03/29/2018  . Neuropathy 08/29/2017  . Smoldering multiple myeloma ([place]) 07/21/2017  . Paroxysmal atrial fibrillation ([place]) 04/05/2017  . Prolapsed internal hemorrhoids, grade 4, s/p ligation/pexy/hemorrhoidectomy x 2 02/24/2017 02/24/2017  . OSA (obstructive sleep apnea) 07/26/2016  . Obesity (BMI 30-39.9) 07/26/2016  . Atrial fibrillation ([place])   . Hypertension   . Chest pain at rest     Past Surgical History:  Procedure Laterality Date  . ATRIAL FIBRILLATION ABLATION  04/05/2017  . ATRIAL FIBRILLATION ABLATION N/A 04/05/2017   Procedure: ATRIAL FIBRILLATION ABLATION;  Surgeon: Thompson Grayer, MD;  Location: [facility];  Service: Cardiovascular;  Laterality: N/A;  . EVALUATION UNDER ANESTHESIA WITH HEMORRHOIDECTOMY N/A 02/24/2017   Procedure: EXAM UNDER ANESTHESIA WITH  HEMORRHOIDECTOMY;  Surgeon: Michael Boston, MD;  Location: [facility];  Service: General;  Laterality: N/A;  . FLEXIBLE SIGMOIDOSCOPY N/A 04/15/2015   Procedure:  UNSEDATED FLEXIBLE SIGMOIDOSCOPY;  Surgeon: [name], MD;  Location: [facility];  Service: Endoscopy;  Laterality: N/A;  . KNEE ARTHROSCOPY Left ~ 2016  . 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  TONSILLECTOMY    . VARICOSE VEIN SURGERY Bilateral         Home Medications    Prior to Admission medications   Medication Sig Start Date End Date Taking? Authorizing Provider  amLODipine (NORVASC) 10 MG tablet Take 10 mg by mouth daily.     [provider]  b complex vitamins tablet Take 1 tablet by mouth daily.    [provider]  cyanocobalamin (,VITAMIN B-12,) 1000 MCG/ML injection Inject into the muscle. 05/12/18   [provider]  ELIQUIS 5 MG TABS tablet TAKE 1 TABLET (5 MG TOTAL) BY MOUTH TWO TIMES DAILY 01/17/18   Sherran Needs, NP  gabapentin (NEURONTIN) 300 MG capsule Take 1 capsule (300 mg total) by mouth 2 (two) times daily. 11/23/17   Narda Amber K, DO  hydrochlorothiazide (MICROZIDE) 12.5 MG capsule Take 12.5 mg by mouth daily.    [provider]  lisinopril (PRINIVIL,ZESTRIL) 30 MG tablet Take 30 mg by mouth daily.     [provider]  metoprolol succinate (TOPROL-XL) 50 MG 24 hr tablet Take 1 tablet (50 mg total) by mouth daily. Take with or immediately following a meal. 03/13/18 06/11/18  Allred, Jeneen Rinks, MD  Multiple Vitamins-Minerals (MULTIVITAMIN PO) Take 1 tablet by mouth daily.    [provider]  psyllium (METAMUCIL) 58.6 % powder Take 1 packet by mouth every evening.     [provider]  sodium chloride (OCEAN) 0.65 % SOLN nasal spray Place 1 spray into both nostrils as needed for congestion.    [provider]    Family History Family History  Problem Relation Age of Onset  . Dementia Mother   . Stroke Father   . Atrial fibrillation Father   . Hypertension Father   . Hypertension Paternal Grandfather   . Dementia Maternal Grandmother     Social History Social History   Tobacco Use  . Smoking status: Never Smoker  . Smokeless tobacco: Never Used  Substance Use Topics  . Alcohol use: Yes    Alcohol/week: 14.0 standard drinks    Types: 14 Glasses of wine per week    Comment:  daily; a couple of glasses of wine every night   . Drug use: No     Allergies   Patient has no known allergies.   Review of Systems Review of Systems  All other systems reviewed and are negative.   Physical Exam Updated Vital Signs BP 133/75 (BP Location: Right Arm)   Pulse 60   Temp 99.2 F (37.3 C) (Oral)   Resp 17   SpO2 99%   Physical Exam Vitals signs and nursing note reviewed.  Constitutional:      Appearance: He is well-developed.  HENT:     Head: Normocephalic and atraumatic.     Comments: Oropharynx clear no erythema edema or exudate Eyes:     General: No scleral icterus.       Right eye: No discharge.  Left eye: No discharge.     Conjunctiva/sclera: Conjunctivae normal.     Pupils: Pupils are equal, round, and reactive to light.  Neck:     Musculoskeletal: Normal range of motion.     Vascular: No JVD.     Trachea: No tracheal deviation.  Cardiovascular:     Rate and Rhythm: Normal rate.     Pulses: Normal pulses.     Heart sounds: Normal heart sounds. No murmur. No friction rub. No gallop.   Pulmonary:     Effort: Pulmonary effort is normal. No respiratory distress.     Breath sounds: Normal breath sounds. No stridor. No wheezing, rhonchi or rales.  Musculoskeletal:        General: No swelling.     Comments: Left radial pulse 2 +  Neurological:     Mental Status: He is alert and oriented to person, place, and time.     Coordination: Coordination normal.  Psychiatric:        Behavior: Behavior normal.        Thought Content: Thought content normal.        Judgment: Judgment normal.     ED Treatments / Results  Labs (all labs ordered are listed, but only abnormal results are displayed) Labs Reviewed  CBC WITH DIFFERENTIAL/PLATELET  BASIC METABOLIC PANEL  I-STAT TROPONIN, ED    EKG EKG Interpretation  Date/Time:  Monday July 10 2018 10:00:10 EDT Ventricular Rate:  63 PR Interval:  176 QRS Duration: 86 QT Interval:  436 QTC  Calculation: 446 R Axis:   73 Text Interpretation:  Normal sinus rhythm Normal ECG No significant change since last tracing Confirmed by Wandra Arthurs [phone]) on 07/10/2018 2:56:38 PM   Radiology Dg Chest 2 View  Result Date: 07/10/2018 CLINICAL DATA:  Cough and chest pain EXAM: CHEST - 2 VIEW COMPARISON:  January 12, 2018 FINDINGS: No edema or consolidation. Heart size and pulmonary vascularity are normal. No adenopathy. There is mild degenerative change in the thoracic spine. IMPRESSION: No edema or consolidation. Electronically Signed   By: Lowella Grip III M.D.   On: 07/10/2018 13:39    Procedures Procedures (including critical care time)  Medications Ordered in ED Medications - No data to display   Initial Impression / Assessment and Plan / ED Course  I have reviewed the triage vital signs and the nursing notes.  Pertinent labs & imaging results that were available during my care of the patient were reviewed by me and considered in my medical decision making (see chart for details).        Labs: I-STAT troponin, CBC, BMP  Imaging: DG chest 2 view  Consults:  Therapeutics:  Discharge Meds:   Assessment/Plan: 68 year old male presents with complaints of chest pain.  Patient has an achy sensation in his left chest, this is nonreproducible.  Patient has had ongoing symptoms for the last 12 hours prior to evaluation.  His EKG is normal, his initial troponin is normal.  He does not appear to be in any acute distress.  Is a heart score of 3 presently I do feel he would be stable for outpatient follow-up with his cardiologist given a second normal troponin.  Very low suspicion for pulmonary embolism, low suspicion for dissection.,  Low suspicion for ACS.  I discussed with the patient he is in agreement.  He has a scheduled appointment with his cardiologist on Wednesday.  He has no signs of pulmonary embolism, low suspicion for bacterial infection.  Patient  has had recent travel  to [place].  He has been back for 14 days today.  He has improvement in symptoms, he has no fever, and he had the infection prior to travel.  I discussed the case with infection prevention and spoke with Sherlynn Stalls who agreed that given the duration of time since travel, previous infection prior and improvement in symptoms not need further testing for coronavirus here in the ED.  Patient will be discharged after repeat troponin with outpatient follow-up.  I discussed return precautions with the patient.  He verbalized understanding and agreement to today's plan and had no further questions or concerns at the time of his discharge.   Final Clinical Impressions(s) / ED Diagnoses   Final diagnoses:  Chest pain, unspecified type  Cough    ED Discharge Orders    None       Francee Gentile 07/10/18 1512    Drenda Freeze, MD 07/10/18 [phone]

## 2018-07-10 NOTE — ED Triage Notes (Signed)
Pt in c/o left sided chest pain that has been intermittent for the last two weeks, pain appears in different locations, today c/o pain to his left chest wall, also reports a cough that has been ongoing for the last three weeks, pt reports cough is productive, denies fever at any time, pt recently traveled to [place], returned on 2/24

## 2018-07-10 NOTE — ED Notes (Signed)
This tech attempted to draw repeat trop, unsuccessful; notified Levada Dy, phlebotomy.

## 2018-07-10 NOTE — Discharge Instructions (Signed)
You were evaluated today. Your work-up in emergency department was negative.  Please keep your appointment with cardiology that you have on 3/11.  If you develop worsening symptoms please return to the emergency department.

## 2018-07-10 NOTE — ED Notes (Signed)
Per EDP pt has been cleared by infectious disease.

## 2018-07-10 NOTE — ED Notes (Signed)
Pt care assumed, obtained verbal report.  Pt is resting and appears comfortable.  He rates his pain in his chest at 2/10.  He is A&O x 4, in NAD.  He is waiting on delta trop result.

## 2018-07-10 NOTE — ED Notes (Signed)
Patient transported to X-ray 

## 2018-07-10 NOTE — ED Notes (Signed)
Mask placed on patient on arrival, pt immediately roomed to a negative pressure room

## 2018-07-12 ENCOUNTER — Other Ambulatory Visit: Payer: Self-pay

## 2018-07-12 ENCOUNTER — Encounter: Payer: Self-pay | Admitting: Physician Assistant

## 2018-07-12 ENCOUNTER — Ambulatory Visit: Payer: Medicare Other | Admitting: Physician Assistant

## 2018-07-12 VITALS — BP 124/68 | HR 62 | Ht 70.0 in | Wt 269.8 lb

## 2018-07-12 DIAGNOSIS — I10 Essential (primary) hypertension: Secondary | ICD-10-CM

## 2018-07-12 DIAGNOSIS — I480 Paroxysmal atrial fibrillation: Secondary | ICD-10-CM | POA: Diagnosis not present

## 2018-07-12 DIAGNOSIS — R0789 Other chest pain: Secondary | ICD-10-CM

## 2018-07-12 DIAGNOSIS — G4733 Obstructive sleep apnea (adult) (pediatric): Secondary | ICD-10-CM | POA: Diagnosis not present

## 2018-07-12 DIAGNOSIS — D472 Monoclonal gammopathy: Secondary | ICD-10-CM

## 2018-07-12 DIAGNOSIS — C9000 Multiple myeloma not having achieved remission: Secondary | ICD-10-CM

## 2018-07-12 NOTE — Progress Notes (Signed)
Cardiology Office Note:    Date:  07/12/2018   ID:  Travis [name], DOB April 03, 1951, MRN [id number]  PCP:  Travis Manes, MD  Cardiologist:  Travis Carnes, MD   Electrophysiologist:  Travis Grayer, MD  Sleep Medicine:  Travis Him, MD    Referring MD: Travis Manes, MD   Chief Complaint  Patient presents with  . Hospitalization Follow-up    Vist to ED x 2 with chest pain     History of Present Illness:    Travis [name] is a 68 y.o. male with paroxysmal atrial fibrillation status post PVI ablation in December 2018, sleep apnea, hypertension, peripheral neuropathy, prior DVT, monoclonal gammopathy (smoldering myeloma).  He has been maintained on Apixaban for anticoagulation.  He has also been on flecainide for rhythm control.  He was last seen by Dr. Rayann [name] in November 2019.  At that time, he was encouraged to stop taking flecainide.  He was evaluated at a hospital in [place] last month, while visiting there.  The records we received are in [place].  Therefore, they are difficult to interpret.  It appears he had 2 troponins obtained.  These must be high-sensitivity troponins.  The value is in picograms per milliliter.  It appears that the cutoff is 14 and he had one troponin that was 15.04 and a second that was 16.67.  Although the values are above the cutoff, it appears that the delta is not significant.  EKG demonstrated normal sinus rhythm.  It appears his heart rate was 60 and QTc was 520.  Other labs included hemoglobin 15.4, creatinine 0.82, potassium 3.3, ALT 64 it appears he may have been referred to cardiology.  Travis [name] returns for follow-up on chest pain.  He is here alone.  He developed URI symptoms prior to going to [place].  He saw his primary care provider and he was not felt to have the flu.  His symptoms improved prior to leaving.  While in [place], he walked up a hill went back to his room and went to bed.  Later that night he had left-sided chest discomfort.  He could not  sleep well and decided to go to the emergency room.  He had pain for several hours.  He then came back to [place].  He had another episode of chest pain sometime after playing golf.  He went to the emergency room at [facility] on 07/10/2018.  Troponin levels were negative.  Chest x-ray was normal.  He has not had shortness of breath, syncope, paroxysmal nocturnal dyspnea or lower extremity swelling.  Prior CV studies:   The following studies were reviewed today:  Long-term monitor 01/26/2018 Predominant rhythm is sinus rhythm Rare premature atrial contractions and premature ventricular contractions Nonsustained atrial tachycardia is observed No sustained arrhythmias  Echocardiogram 07/29/2017 GLS -17%, mild concentric LVH, EF 60-65, normal wall motion, normal diastolic function, ascending aorta 38 mm, aortic root 40 mm  Cardiac CTA 03/31/2017 IMPRESSION: 1. There is normal pulmonary vein drainage into the left atrium.  2. The left atrial appendage is large with chicken wing morphology and one major lobe. Ostial size 27 x 21 mm and length 42 mm. There is no thrombus in the left atrial appendage.  3. The esophagus runs to the left from the left atrial midline and is in the proximity to the LLPV.  4. Normal coronary origin. Right dominance. Calcium score 0. No evidence of CAD.   Myoview 02/18/2016 Normal resting and stress perfusion. No ischemia or  infarction EF 60%  Echocardiogram 11/20/2015 Mild LVH, EF 60-65  Past Medical History:  Diagnosis Date  . Arthritis    "comes and goes; mostly in hands, occasionally elbow" (04/05/2017)  . Complication of anesthesia    "just wanted to sleep alot  for couple days S/P VARICOCELE OR" (04/05/2017)  . DVT (deep venous thrombosis) (HCC)    LLE  . Dysrhythmia    PVCs   . GERD (gastroesophageal reflux disease)   . History of hiatal hernia   . Hypertension   . Impaired glucose tolerance   . Myeloma ([place])   . Obesity (BMI 30-39.9)  07/26/2016  . OSA on CPAP 07/26/2016   Mild with AHI 12.5/hr now on CPAP at 14cm H2O  . Paroxysmal atrial fibrillation (HCC)   . Pneumonia    "may have had walking pneumonia a few years ago" (04/05/2017)  . Thrombophlebitis    Surgical Hx: The patient  has a past surgical history that includes Tonsillectomy; Flexible sigmoidoscopy (N/A, 04/15/2015); Testicle surgery (1988); Exam under anesthesia with hemorrhoidectomy (N/A, 02/24/2017); ATRIAL FIBRILLATION ABLATION (04/05/2017); Knee arthroscopy (Left, ~ 2016); Varicose vein surgery (Bilateral); and ATRIAL FIBRILLATION ABLATION (N/A, 04/05/2017).   Current Medications: Current Meds  Medication Sig  . acetaminophen (TYLENOL) 500 MG tablet Take 500-1,000 mg by mouth every 6 (six) hours as needed for mild pain, fever or headache.  [name] amLODipine (NORVASC) 10 MG tablet Take 10 mg by mouth daily.   [name] b complex vitamins tablet Take 1 tablet by mouth daily.  . cyanocobalamin (,VITAMIN B-12,) 1000 MCG/ML injection Inject 1,000 mcg into the muscle every 30 (thirty) days.   [name] ELIQUIS 5 MG TABS tablet TAKE 1 TABLET (5 MG TOTAL) BY MOUTH TWO TIMES DAILY  . gabapentin (NEURONTIN) 300 MG capsule Take 1 capsule (300 mg total) by mouth 2 (two) times daily.  . hydrochlorothiazide (MICROZIDE) 12.5 MG capsule Take 12.5 mg by mouth daily.  [name] lisinopril (PRINIVIL,ZESTRIL) 30 MG tablet Take 30 mg by mouth daily.   . metoprolol succinate (TOPROL-XL) 100 MG 24 hr tablet Take 100 mg by mouth daily. Take with or immediately following a meal.  . Multiple Vitamins-Minerals (MULTIVITAMIN PO) Take 1 tablet by mouth 2 (two) times a week.   . psyllium (METAMUCIL) 58.6 % powder Take 1 packet by mouth every evening.   . sodium chloride (OCEAN) 0.65 % SOLN nasal spray Place 1 spray into both nostrils as needed for congestion.  . Vitamin D, Ergocalciferol, (DRISDOL) 1.25 MG (50000 UT) CAPS capsule Take 50,000 Units by mouth every Sunday.      Allergies:   Patient has no known  allergies.   Social History   Tobacco Use  . Smoking status: Never Smoker  . Smokeless tobacco: Never Used  Substance Use Topics  . Alcohol use: Yes    Alcohol/week: 14.0 standard drinks    Types: 14 Glasses of wine per week    Comment: daily; a couple of glasses of wine every night   . Drug use: No     Family Hx: The patient's family history includes Atrial fibrillation in his father; Dementia in his maternal grandmother and mother; Hypertension in his father and paternal grandfather; Stroke in his father.  ROS:   Please see the history of present illness.    Review of Systems  Cardiovascular: Positive for chest pain.  Respiratory: Positive for cough.   Musculoskeletal: Positive for back pain and myalgias.   All other systems reviewed and are negative.   EKGs/Labs/Other Test  Reviewed:    EKG:  EKG is  ordered today.  The ekg ordered today demonstrates normal sinus rhythm, HR 62, normal axis, nonspecific ST-T wave changes, QTC 454  Recent Labs: 06/29/2018: ALT 45 07/10/2018: BUN 20; Creatinine, Ser 0.83; Hemoglobin 15.8; Platelets 236; Potassium 3.7; Sodium 138   Recent Lipid Panel No results found for: CHOL, TRIG, HDL, CHOLHDL, LDLCALC, LDLDIRECT  Physical Exam:    VS:  BP 124/68   Pulse 62   Ht _0  (1.778 m)   Wt 269 lb 12.8 oz (122.4 kg)   BMI 38.71 kg/m     Wt Readings from Last 3 Encounters:  07/12/18 269 lb 12.8 oz (122.4 kg)  07/06/18 268 lb 1.6 oz (121.6 kg)  05/26/18 271 lb 6 oz (123.1 kg)     Physical Exam  Constitutional: He is oriented to person, place, and time. He appears well-developed and well-nourished. No distress.  HENT:  Head: Normocephalic and atraumatic.  Eyes: No scleral icterus.  Neck: No JVD present. No thyromegaly present.  Cardiovascular: Normal rate and regular rhythm.  No murmur heard. Pulmonary/Chest: Effort normal. He has no rales.  Abdominal: Soft.  Musculoskeletal:        General: No edema.  Lymphadenopathy:    He has  no cervical adenopathy.  Neurological: He is alert and oriented to person, place, and time.  Skin: Skin is warm and dry.  Psychiatric: He has a normal mood and affect.    ASSESSMENT & PLAN:    Other chest pain His chest discomfort is somewhat atypical for ischemia.  His ECG does not demonstrate any ischemic changes.  He had a cardiac CTA in November 2018 prior to his PVI ablation that demonstrated no evidence of CAD and a calcium score of 0.  His high-sensitivity troponin while in [place] demonstrated an insignificant delta.  He had a chest x-ray here several days ago that was normal.  He also had repeat troponin levels that were normal.  Oxygen levels are normal.  He has been on anticoagulation without interruption.  Therefore, pulmonary embolism is not likely.  It appears that his d-dimer while in [place] was just minimally elevated.  I suspect his symptoms are musculoskeletal in nature.  At this point, I do not believe that he needs a stress test or further cardiac work-up.  If he should have recurrent symptoms or worsening chest discomfort, we can certainly consider stress testing.  Paroxysmal atrial fibrillation (HCC)  Status post prior PVI ablation.  No apparent recurrence.  Continue Apixaban for anticoagulation.  Essential hypertension - The patient's blood pressure is controlled on his current regimen.  Continue current therapy.   OSA (obstructive sleep apnea) Continue CPAP.  Smoldering multiple myeloma ([place]) Continue follow-up with oncology as planned.   Dispo:  Return in about 3 months (around 10/12/2018) for Routine Follow Up, w/ [name].   Medication Adjustments/Labs and Tests Ordered: Current medicines are reviewed at length with the patient today.  Concerns regarding medicines are outlined above.  Tests Ordered: Orders Placed This Encounter  Procedures  . EKG 12-Lead   Medication Changes: No orders of the defined types were placed in this encounter.   Signed, Richardson Dopp, PA-C  07/12/2018 9:34 PM    [facility] [address], [place], [place]  [address] Phone: [phone]; Fax: [phone]

## 2018-07-12 NOTE — Patient Instructions (Signed)
Medication Instructions:   Your physician recommends that you continue on your current medications as directed. Please refer to the Current Medication list given to you today.   If you need a refill on your cardiac medications before your next appointment, please call your pharmacy.   Lab work: NONE ORDERED  TODAY   If you have labs (blood work) drawn today and your tests are completely normal, you will receive your results only by: . MyChart Message (if you have MyChart) OR . A paper copy in the mail If you have any lab test that is abnormal or we need to change your treatment, we will call you to review the results.  Testing/Procedures: NONE ORDERED  TODAY    Follow-Up: At [facility], you and your health needs are our priority.  As part of our continuing mission to provide you with exceptional heart care, we have created designated Provider Care Teams.  These Care Teams include your primary Cardiologist (physician) and Advanced Practice Providers (APPs -  Physician Assistants and Nurse Practitioners) who all work together to provide you with the care you need, when you need it. You will need a follow up appointment in:  3 months.  Please call our office 2 months in advance to schedule this appointment.  You may see Paula Ross, MD or one of the following Advanced Practice Providers on your designated Care Team: Scott Weaver, PA-C Vin Bhagat, PA-C . Janine Hammond, NP  Any Other Special Instructions Will Be Listed Below (If Applicable).    

## 2018-07-27 ENCOUNTER — Other Ambulatory Visit (HOSPITAL_COMMUNITY): Payer: Self-pay | Admitting: Nurse Practitioner

## 2018-07-27 NOTE — Telephone Encounter (Signed)
Last OV 07/12/2018 Scr 0.83 as of 07/10/2018

## 2018-08-16 ENCOUNTER — Telehealth: Payer: Self-pay | Admitting: Internal Medicine

## 2018-08-16 NOTE — Telephone Encounter (Signed)
Patient texted/called. He went into afib last night   Took 75 mg flecanide   HR 130s   Took AM mds    BP 108/     No dizziness  Reviewed with J Allred  Recomm:   Take 3 75 mg tabs       Called pt back    HR 50s  Back in Sr   Would keep on same meds     Avoid alcohol (said he had small 1 1/2 glasses last night)  Will relay msg to afib clinic   If takes flecanide in future for afib 1.  Take toprol dose 2  Then take four 75 mg tabs     3   Dont take other BP meds that AM

## 2018-08-23 ENCOUNTER — Other Ambulatory Visit (HOSPITAL_COMMUNITY): Payer: Self-pay | Admitting: Nurse Practitioner

## 2018-08-30 ENCOUNTER — Encounter (HOSPITAL_COMMUNITY): Payer: Self-pay

## 2018-09-07 ENCOUNTER — Other Ambulatory Visit: Payer: Self-pay

## 2018-09-07 ENCOUNTER — Encounter: Payer: Self-pay | Admitting: Podiatry

## 2018-09-07 ENCOUNTER — Ambulatory Visit: Payer: Medicare Other | Admitting: Podiatry

## 2018-09-07 VITALS — Temp 98.2°F

## 2018-09-07 DIAGNOSIS — M722 Plantar fascial fibromatosis: Secondary | ICD-10-CM

## 2018-09-07 MED ORDER — TRIAMCINOLONE ACETONIDE 10 MG/ML IJ SUSP
10.0000 mg | Freq: Once | INTRAMUSCULAR | Status: AC
  Administered 2018-09-07: 10 mg

## 2018-09-08 NOTE — Progress Notes (Signed)
Subjective:   Patient ID: Travis [name], male   DOB: 68 y.o.   MRN: [id number]   HPI Patient presents stating that he was able to go to [place] without pain but has developed recurrence of his plantar fasciitis with golf over the last 6 to 8 weeks and it is been very sore.  Patient is interested in orthotics of a softer variety   ROS      Objective:  Physical Exam  Neurovascular status intact with acute inflammation plantar heel left at the insertional point tendon calcaneus with patient able to go on trip without pain associated with this     Assessment:  Acute plantar fasciitis left with inflammation fluid buildup with structural changes associated with gait as part of the problem     Plan:  H&P condition reviewed and today I did sterile prep and injected the plantar fascia 3 mg Kenalog 5 mg Xylocaine and applied sterile dressing.  I then recommended orthotics and he will see Liliane Channel and I do think a supportive orthotic with good cushion would be best for him.  Patient will be seen back when orthotics are ready and will see me as symptoms indicate

## 2018-09-11 ENCOUNTER — Other Ambulatory Visit (HOSPITAL_COMMUNITY): Payer: Self-pay | Admitting: *Deleted

## 2018-09-11 ENCOUNTER — Encounter (HOSPITAL_COMMUNITY): Payer: Self-pay | Admitting: Nurse Practitioner

## 2018-09-11 ENCOUNTER — Other Ambulatory Visit: Payer: Self-pay

## 2018-09-11 ENCOUNTER — Ambulatory Visit (HOSPITAL_COMMUNITY)
Admission: RE | Admit: 2018-09-11 | Discharge: 2018-09-11 | Disposition: A | Discharge status: Home / Self Care | Payer: Medicare Other | Source: Ambulatory Visit | Attending: Nurse Practitioner | Admitting: Nurse Practitioner

## 2018-09-11 DIAGNOSIS — I480 Paroxysmal atrial fibrillation: Secondary | ICD-10-CM

## 2018-09-11 MED ORDER — FLECAINIDE ACETATE 50 MG PO TABS: ORAL_TABLET | ORAL | 3 refills | Status: DC

## 2018-09-11 NOTE — Progress Notes (Signed)
Electrophysiology TeleHealth Note   Due to national recommendations of social distancing due to [place] 19, Audio/video telehealth visit is felt to be most appropriate for this patient at this time.  See MyChart message/consent below from today for patient consent regarding telehealth for the [facility].    Date:  09/11/2018   ID:  Travis [name], DOB Mar 14, 1951, MRN [id number]  Location: home Provider location: [address] [place], [place] [address] Evaluation Performed: Follow up  PCP:  Lajean Manes, MD  Primary Cardiologist:  [name] Primary Electrophysiologist: Dr. Rayann Heman  CC: Afib    History of Present Illness: Travis [name] is a 68 y.o. male who presents via audio/video conferencing for a telehealth visit today.     The pt reports that he has not had any afib recently. He did have an episodes of several hours of afib but did break fairly quickly with 50 mg tabs of flecainide x 4 tabs. None since.   He does report that he was in [place] in February and went to the ER for atypical chest pain. He thinks now it was from carrying heavy bags on his shoulder while traveling.He did not have any signs of covid 19 as a consequence  of that trip.   Today, he denies symptoms of palpitations, chest pain, shortness of breath, orthopnea, PND, lower extremity edema, claudication, dizziness, presyncope, syncope, bleeding, or neurologic sequela. The patient is tolerating medications without difficulties and is otherwise without complaint today.   he denies symptoms of cough, fevers, chills, or new SOB worrisome for COVID 19.    Atrial Fibrillation Risk Factors:  he does not have symptoms or diagnosis of sleep apnea. he is compliant with CPAP therapy. he does not have a history of rheumatic fever. he does have a history of alcohol use. The patient does not have a history of early familial atrial fibrillation or other arrhythmias.  he has a BMI of There is no  height or weight on file to calculate BMI.. There were no vitals filed for this visit.  Past Medical History:  Diagnosis Date  . Arthritis    "comes and goes; mostly in hands, occasionally elbow" (04/05/2017)  . Complication of anesthesia    "just wanted to sleep alot  for couple days S/P VARICOCELE OR" (04/05/2017)  . DVT (deep venous thrombosis) (HCC)    LLE  . Dysrhythmia    PVCs   . GERD (gastroesophageal reflux disease)   . History of hiatal hernia   . Hypertension   . Impaired glucose tolerance   . Myeloma ([place])   . Obesity (BMI 30-39.9) 07/26/2016  . OSA on CPAP 07/26/2016   Mild with AHI 12.5/hr now on CPAP at 14cm H2O  . Paroxysmal atrial fibrillation (HCC)   . Pneumonia    "may have had walking pneumonia a few years ago" (04/05/2017)  . Thrombophlebitis    Past Surgical History:  Procedure Laterality Date  . ATRIAL FIBRILLATION ABLATION  04/05/2017  . ATRIAL FIBRILLATION ABLATION N/A 04/05/2017   Procedure: ATRIAL FIBRILLATION ABLATION;  Surgeon: Thompson Grayer, MD;  Location: [facility];  Service: Cardiovascular;  Laterality: N/A;  . EVALUATION UNDER ANESTHESIA WITH HEMORRHOIDECTOMY N/A 02/24/2017   Procedure: EXAM UNDER ANESTHESIA WITH  HEMORRHOIDECTOMY;  Surgeon: Michael Boston, MD;  Location: [facility];  Service: General;  Laterality: N/A;  . FLEXIBLE SIGMOIDOSCOPY N/A 04/15/2015   Procedure:  UNSEDATED FLEXIBLE SIGMOIDOSCOPY;  Surgeon: [name], MD;  [facility]: [facility];  Service: Endoscopy;  Laterality: N/A;  . KNEE ARTHROSCOPY Left ~ 2016  . 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 TONSILLECTOMY    . VARICOSE VEIN SURGERY Bilateral      Current Outpatient Medications  Medication Sig Dispense Refill  . acetaminophen (TYLENOL) 500 MG tablet Take 500-1,000 mg by mouth every 6 (six) hours as needed for mild pain, fever or headache.    [name] amLODipine (NORVASC) 10 MG tablet Take 10 mg by mouth daily.     [name] b complex vitamins tablet Take 1 tablet by mouth  daily.    . cyanocobalamin (,VITAMIN B-12,) 1000 MCG/ML injection Inject 1,000 mcg into the muscle every 30 (thirty) days.     [name] ELIQUIS 5 MG TABS tablet TAKE ONE TABLET BY MOUTH TWICE A DAY 60 tablet 4  . gabapentin (NEURONTIN) 300 MG capsule Take 1 capsule (300 mg total) by mouth 2 (two) times daily. 180 capsule 3  . hydrochlorothiazide (MICROZIDE) 12.5 MG capsule Take 12.5 mg by mouth daily.    [name] lisinopril (PRINIVIL,ZESTRIL) 30 MG tablet Take 30 mg by mouth daily.     [name] MELATONIN PO Take 1 tablet by mouth daily after supper. Helps to sleep    . metoprolol succinate (TOPROL-XL) 100 MG 24 hr tablet Take 100 mg by mouth daily. Take with or immediately following a meal.    . Multiple Vitamins-Minerals (MULTIVITAMIN PO) Take 1 tablet by mouth 2 (two) times a week.     . psyllium (METAMUCIL) 58.6 % powder Take 1 packet by mouth every evening.     . sodium chloride (OCEAN) 0.65 % SOLN nasal spray Place 1 spray into both nostrils as needed for congestion.    . Vitamin D, Ergocalciferol, (DRISDOL) 1.25 MG (50000 UT) CAPS capsule Take 50,000 Units by mouth every Sunday.      No current facility-administered medications for this encounter.     Allergies:   Patient has no known allergies.   Social History:  The patient  reports that he has never smoked. He has never used smokeless tobacco. He reports current alcohol use of about 14.0 standard drinks of alcohol per week. He reports that he does not use drugs.   Family History:  The patient's  family history includes Atrial fibrillation in his father; Dementia in his maternal grandmother and mother; Hypertension in his father and paternal grandfather; Stroke in his father.    ROS:  Please see the history of present illness.   All other systems are personally reviewed and negative.   Exam: Well appearing, alert and conversant, regular work of breathing,  good skin color  Recent Labs: 06/29/2018: ALT 45 07/10/2018: BUN 20; Creatinine, Ser 0.83;  Hemoglobin 15.8; Platelets 236; Potassium 3.7; Sodium 138  personally reviewed    Other studies personally reviewed: Epic records reviewed     ASSESSMENT AND PLAN:  1.  Paroxysmal atrial fibrillation So far low burden Continue daily toprol He has PIP flecainide to use as needed He would be interested in a touch up ablation if afib burden should increase Continue eliquis 5 mg bid, no bleeding issues   This patients CHA2DS2-VASc Score and unadjusted Ischemic Stroke Rate (% per year) is equal to 4.8 % stroke rate/year from a score of 4  Above score calculated as 1 point each if present [CHF, HTN, DM, Vascular=MI/PAD/Aortic Plaque, Age if 65-74, or Male] Above score calculated as 2 points each if present [Age > 75, or Stroke/TIA/TE]  2.htn Stable  Some  elevation this am at 156/81 but outlier reading for pt  Usually runs 129/72 per pt   COVID screen The patient does not have any symptoms that suggest any further testing/ screening at this time.  Social distancing reinforced today.     Follow-up:   In 6 months with Dr. Rayann Heman as per recall  Current medicines are reviewed at length with the patient today.   The patient does not have concerns regarding his medicines.  The following changes were made today:  none  Labs/ tests ordered today include: none No orders of the defined types were placed in this encounter.   Patient Risk:  after full review of this patients clinical status, I feel that they are at mod risk at this time.   Today, I have spent 15 minutes with the patient with telehealth technology discussing afib management    Signed, Roderic Palau NP  09/11/2018 11:26 AM  Afib [facility] [address] [place], [place] [address] [phone]   I hereby voluntarily request, consent and authorize the [facility] and its employed or contracted physicians, physician assistants, nurse practitioners or other licensed health care  professionals (the Practitioner), to provide me with telemedicine health care services (the "Services") as deemed necessary by the treating Practitioner. I acknowledge and consent to receive the Services by the Practitioner via telemedicine. I understand that the telemedicine visit will involve communicating with the Practitioner through live audiovisual communication technology and the disclosure of certain medical information by electronic transmission. I acknowledge that I have been given the opportunity to request an in-person assessment or other available alternative prior to the telemedicine visit and am voluntarily participating in the telemedicine visit.   I understand that I have the right to withhold or withdraw my consent to the use of telemedicine in the course of my care at any time, without affecting my right to future care or treatment, and that the Practitioner or I may terminate the telemedicine visit at any time. I understand that I have the right to inspect all information obtained and/or recorded in the course of the telemedicine visit and may receive copies of available information for a reasonable fee.  I understand that some of the potential risks of receiving the Services via telemedicine include:   Delay or interruption in medical evaluation due to technological equipment failure or disruption;  Information transmitted may not be sufficient (e.g. poor resolution of images) to allow for appropriate medical decision making by the Practitioner; and/or  In rare instances, security protocols could fail, causing a breach of personal health information.   Furthermore, I acknowledge that it is my responsibility to provide information about my medical history, conditions and care that is complete and accurate to the best of my ability. I acknowledge that Practitioner's advice, recommendations, and/or decision may be based on factors not within their control, such as incomplete or inaccurate  data provided by me or distortions of diagnostic images or specimens that may result from electronic transmissions. I understand that the practice of medicine is not an exact science and that Practitioner makes no warranties or guarantees regarding treatment outcomes. I acknowledge that I will receive a copy of this consent concurrently upon execution via email to the email address I last provided but may also request a printed copy by calling the office of the [facility].  I understand that my insurance will be billed for this visit.   I have read or had this consent read  to me.  I understand the contents of this consent, which adequately explains the benefits and risks of the Services being provided via telemedicine.  I have been provided ample opportunity to ask questions regarding this consent and the Services and have had my questions answered to my satisfaction.  I give my informed consent for the services to be provided through the use of telemedicine in my medical care  By participating in this telemedicine visit I agree to the above.

## 2018-09-21 ENCOUNTER — Other Ambulatory Visit: Payer: Self-pay

## 2018-09-21 ENCOUNTER — Other Ambulatory Visit: Payer: Medicare Other | Admitting: Orthotics

## 2018-09-21 DIAGNOSIS — M722 Plantar fascial fibromatosis: Secondary | ICD-10-CM

## 2018-09-30 ENCOUNTER — Other Ambulatory Visit: Payer: Self-pay | Admitting: Internal Medicine

## 2018-10-02 ENCOUNTER — Other Ambulatory Visit: Payer: Self-pay | Admitting: Internal Medicine

## 2018-10-02 MED ORDER — FLECAINIDE ACETATE 50 MG PO TABS: ORAL_TABLET | ORAL | 1 refills | Status: DC

## 2018-10-02 NOTE — Telephone Encounter (Signed)
This is a A-Fib clinic pt 

## 2018-10-05 ENCOUNTER — Telehealth: Payer: Self-pay | Admitting: *Deleted

## 2018-10-05 NOTE — Telephone Encounter (Signed)
Spoke with patient and patient agrees to [phone]

## 2018-10-08 NOTE — Progress Notes (Signed)
Virtual Visit via Video Note   This visit type was conducted due to national recommendations for restrictions regarding the COVID-19 Pandemic (e.g. social distancing) in an effort to limit this patient's exposure and mitigate transmission in our community.  Due to his co-morbid illnesses, this patient is at least at moderate risk for complications without adequate follow up.  This format is felt to be most appropriate for this patient at this time.  All issues noted in this document were discussed and addressed.  A limited physical exam was performed with this format.  Please refer to the patient's chart for his consent to telehealth for [facility].   Date:  10/09/2018   ID:  Travis [name], DOB 1950-10-27, MRN [id number]  Patient Location: Home Provider Location: Home  PCP:  Lajean Manes, MD  Cardiologist:  [name], MD   Electrophysiologist:  Thompson Grayer, MD  Sleep Medicine:  Fransico Him, MD    Evaluation Performed:  Follow-Up Visit  Chief Complaint  Patient presents with  . Follow-up    Atrial fibrillation     History of Present Illness:    Travis [name] is a 68 y.o. male with paroxysmal atrial fibrillation status post PVI ablation in December 2018, sleep apnea, hypertension, peripheral neuropathy, prior DVT, monoclonal gammopathy (smoldering myeloma).  He has been maintained on Apixaban for anticoagulation.  He has also been on flecainide for rhythm control.  He was last seen in 07/2018 for chest pain.  His symptoms were atypical for ischemia and no further testing was recommended.  Today, he notes that he has had a couple of episodes of atrial fibrillation since he was seen last.  One episode lasted about 7 hours.  He did speak to [name] and was told to take a dose of flecainide.  His symptoms eventually resolved about an hour after taking the flecainide.  He had another episode that lasted about 4 hours.  He has not had any further episodes.  He has not had chest  pain, shortness of breath, syncope.  He has not had any bleeding issues.  He hopes to go to [place] in September to attend a wedding.  The patient does not have symptoms concerning for COVID-19 infection (fever, chills, cough, or new shortness of breath).    Past Medical History:  Diagnosis Date  . Arthritis    "comes and goes; mostly in hands, occasionally elbow" (04/05/2017)  . Complication of anesthesia    "just wanted to sleep alot  for couple days S/P VARICOCELE OR" (04/05/2017)  . DVT (deep venous thrombosis) (HCC)    LLE  . Dysrhythmia    PVCs   . GERD (gastroesophageal reflux disease)   . History of hiatal hernia   . Hypertension   . Impaired glucose tolerance   . Myeloma ([place])   . Obesity (BMI 30-39.9) 07/26/2016  . OSA on CPAP 07/26/2016   Mild with AHI 12.5/hr now on CPAP at 14cm H2O  . Paroxysmal atrial fibrillation (HCC)   . Pneumonia    "may have had walking pneumonia a few years ago" (04/05/2017)  . Thrombophlebitis    Past Surgical History:  Procedure Laterality Date  . ATRIAL FIBRILLATION ABLATION  04/05/2017  . ATRIAL FIBRILLATION ABLATION N/A 04/05/2017   Procedure: ATRIAL FIBRILLATION ABLATION;  Surgeon: Thompson Grayer, MD;  Location: [facility];  Service: Cardiovascular;  Laterality: N/A;  . EVALUATION UNDER ANESTHESIA WITH HEMORRHOIDECTOMY N/A 02/24/2017   Procedure: EXAM UNDER ANESTHESIA WITH  HEMORRHOIDECTOMY;  Surgeon: Michael Boston, MD;  [facility];  Service: General;  Laterality: N/A;  . FLEXIBLE SIGMOIDOSCOPY N/A 04/15/2015   Procedure:  UNSEDATED FLEXIBLE SIGMOIDOSCOPY;  Surgeon: [name], MD;  [facility];  Service: Endoscopy;  Laterality: N/A;  . KNEE ARTHROSCOPY Left ~ 2016  . 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 TONSILLECTOMY    . VARICOSE VEIN SURGERY Bilateral      Current Meds  Medication Sig  . acetaminophen (TYLENOL) 500 MG tablet Take 500-1,000 mg by mouth every 6 (six) hours as needed for mild pain,  fever or headache.  [name] amLODipine (NORVASC) 10 MG tablet Take 10 mg by mouth daily.   [name] b complex vitamins tablet Take 1 tablet by mouth daily.  . cyanocobalamin (,VITAMIN B-12,) 1000 MCG/ML injection Inject 1,000 mcg into the muscle every 30 (thirty) days.   [name] ELIQUIS 5 MG TABS tablet TAKE ONE TABLET BY MOUTH TWICE A DAY  . flecainide (TAMBOCOR) 50 MG tablet Take 4 tablets by mouth for breakthrough afib. May repeat every 4 days only  . gabapentin (NEURONTIN) 300 MG capsule Take 1 capsule (300 mg total) by mouth 2 (two) times daily.  . hydrochlorothiazide (MICROZIDE) 12.5 MG capsule Take 12.5 mg by mouth daily.  [name] lisinopril (PRINIVIL,ZESTRIL) 30 MG tablet Take 30 mg by mouth daily.   [name] MELATONIN PO Take 1 tablet by mouth daily after supper. Helps to sleep  . metoprolol succinate (TOPROL-XL) 100 MG 24 hr tablet Take 100 mg by mouth daily. Take with or immediately following a meal.  . Multiple Vitamins-Minerals (MULTIVITAMIN PO) Take 1 tablet by mouth 2 (two) times a week.   . psyllium (METAMUCIL) 58.6 % powder Take 1 packet by mouth every evening.   . sodium chloride (OCEAN) 0.65 % SOLN nasal spray Place 1 spray into both nostrils as needed for congestion.  . Vitamin D, Ergocalciferol, (DRISDOL) 1.25 MG (50000 UT) CAPS capsule Take 50,000 Units by mouth every Sunday.      Allergies:   Patient has no known allergies.   Social History   Tobacco Use  . Smoking status: Never Smoker  . Smokeless tobacco: Never Used  Substance Use Topics  . Alcohol use: Yes    Alcohol/week: 14.0 standard drinks    Types: 14 Glasses of wine per week    Comment: daily; a couple of glasses of wine every night   . Drug use: No     Family Hx: The patient's family history includes Atrial fibrillation in his father; Dementia in his maternal grandmother and mother; Hypertension in his father and paternal grandfather; Stroke in his father.  ROS:   Please see the history of present illness.    All other systems  reviewed and are negative.   Prior CV studies:   The following studies were reviewed today:  Long-term monitor 01/26/2018 Predominant rhythm is sinus rhythm Rare premature atrial contractions and premature ventricular contractions Nonsustained atrial tachycardia is observed No sustained arrhythmias  Echocardiogram 07/29/2017 GLS -17%, mild concentric LVH, EF 60-65, normal wall motion, normal diastolic function, ascending aorta 38 mm, aortic root 40 mm  Cardiac CTA 03/31/2017 IMPRESSION: 1. There is normal pulmonary vein drainage into the left atrium.  2. The left atrial appendage is large with chicken wing morphology and one major lobe. Ostial size 27 x 21 mm and length 42 mm. There is no thrombus in the left atrial appendage.  3. The esophagus runs to the left from the left atrial  midline and is in the proximity to the [place].  4. Normal coronary origin. Right dominance. Calcium score 0. No evidence of CAD.   Myoview 02/18/2016 Normal resting and stress perfusion. No ischemia or infarction EF 60%  Echocardiogram 11/20/2015 Mild LVH, EF 60-65   Labs/Other Tests and Data Reviewed:    EKG:  No ECG reviewed.  Recent Labs: 06/29/2018: ALT 45 07/10/2018: BUN 20; Creatinine, Ser 0.83; Hemoglobin 15.8; Platelets 236; Potassium 3.7; Sodium 138   Recent Lipid Panel No results found for: CHOL, TRIG, HDL, CHOLHDL, LDLCALC, LDLDIRECT  Wt Readings from Last 3 Encounters:  10/09/18 269 lb (122 kg)  07/12/18 269 lb 12.8 oz (122.4 kg)  07/06/18 268 lb 1.6 oz (121.6 kg)      Objective:    Vital Signs:  Ht 5\' 10"  (1.778 m)   Wt 269 lb (122 kg)   BMI 38.60 kg/m    VITAL SIGNS:  reviewed GEN:  no acute distress RESPIRATORY:  normal respiratory effort, symmetric expansion NEURO:  alert and oriented x 3, no obvious focal deficit PSYCH:  normal affect  ASSESSMENT & PLAN:     Paroxysmal atrial fibrillation (HCC) History of PVI ablation.  He has had 2 episodes of  atrial fibrillation lasting several hours.  He took flecainide on both occasions with resolution of symptoms.  Currently he is taking flecainide as a "pill in pocket" therapy.  If he continues to have recurrent episodes of atrial fibrillation, we may need to consider putting him back on chronic therapy with flecainide.  I have encouraged him to contact us if he has recurrent symptoms in the near future.  He would need earlier follow-up with Dr. Rayann Heman if this is the case.  Recent CBC and BMET were stable.  He is not having any issues with his anticoagulation and will remain on Apixaban.  Essential hypertension  Blood pressures at home have been ranging in the 120s/70s.  Continue current therapy.  Time:   Today, I have spent 14 minutes with the patient with telehealth technology discussing the above problems.     Medication Adjustments/Labs and Tests Ordered: Current medicines are reviewed at length with the patient today.  Concerns regarding medicines are outlined above.   Tests Ordered: No orders of the defined types were placed in this encounter.   Medication Changes: No orders of the defined types were placed in this encounter.   Disposition:  Follow up in 5 month(s) with Dr. Rayann Heman.  Follow-up in 1 year with [name].  Signed, Richardson Dopp, PA-C  10/09/2018 2:47 PM    [facility]

## 2018-10-09 ENCOUNTER — Telehealth (INDEPENDENT_AMBULATORY_CARE_PROVIDER_SITE_OTHER): Payer: Medicare Other | Admitting: Physician Assistant

## 2018-10-09 ENCOUNTER — Encounter: Payer: Self-pay | Admitting: Physician Assistant

## 2018-10-09 VITALS — Ht 70.0 in | Wt 269.0 lb

## 2018-10-09 DIAGNOSIS — I10 Essential (primary) hypertension: Secondary | ICD-10-CM | POA: Diagnosis not present

## 2018-10-09 DIAGNOSIS — I480 Paroxysmal atrial fibrillation: Secondary | ICD-10-CM

## 2018-10-09 NOTE — Patient Instructions (Signed)
Medication Instructions:  Your physician recommends that you continue on your current medications as directed. Please refer to the Current Medication list given to you today.  If you need a refill on your cardiac medications before your next appointment, please call your pharmacy.   Lab work: NONE If you have labs (blood work) drawn today and your tests are completely normal, you will receive your results only by: [name] MyChart Message (if you have MyChart) OR . A paper copy in the mail If you have any lab test that is abnormal or we need to change your treatment, we will call you to review the results.  Testing/Procedures: NONE  Follow-Up: At [facility], you and your health needs are our priority.  As part of our continuing mission to provide you with exceptional heart care, we have created designated Provider Care Teams.  These Care Teams include your primary Cardiologist (physician) and Advanced Practice Providers (APPs -  Physician Assistants and Nurse Practitioners) who all work together to provide you with the care you need, when you need it. You will need a follow up appointment in:  1 years.  Please call our office 2 months in advance to schedule this appointment.  You may see [name], MD or one of the following Advanced Practice Providers on your designated Care Team: Richardson Dopp, PA-C [place], [place] . Daune Perch, NP  PLEASE KEEP YOU FOLLOW-UP APPOINTMENT WITH DR. ALLRED IN November 2020.   Any Other Special Instructions Will Be Listed Below (If Applicable).  1. If you have recurrent symptoms of atrial fibrillation, contact our office [phone]) earlier for follow-up.

## 2018-10-12 ENCOUNTER — Other Ambulatory Visit: Payer: Self-pay

## 2018-10-12 ENCOUNTER — Ambulatory Visit: Payer: Medicare Other | Admitting: Orthotics

## 2018-10-12 DIAGNOSIS — M722 Plantar fascial fibromatosis: Secondary | ICD-10-CM

## 2018-10-12 DIAGNOSIS — E349 Endocrine disorder, unspecified: Secondary | ICD-10-CM

## 2018-10-12 DIAGNOSIS — G63 Polyneuropathy in diseases classified elsewhere: Secondary | ICD-10-CM

## 2018-10-12 NOTE — Progress Notes (Signed)
Patient came in today to pick up custom made foot orthotics.  The goals were accomplished and the patient reported no dissatisfaction with said orthotics.  Patient was advised of breakin period and how to report any issues. 

## 2018-10-13 ENCOUNTER — Ambulatory Visit: Payer: Medicare Other | Admitting: Physician Assistant

## 2018-10-16 ENCOUNTER — Telehealth: Payer: Self-pay | Admitting: Neurology

## 2018-10-16 NOTE — Telephone Encounter (Signed)
Patient is wanting to talk to someone about the pain he is having, he would like to know if they can increase the medication gabapentin

## 2018-10-17 MED ORDER — GABAPENTIN 300 MG PO CAPS: ORAL_CAPSULE | ORAL | 3 refills | Status: DC

## 2018-10-17 NOTE — Telephone Encounter (Signed)
Pt instructed on how to increase Gabapentin. Refills sent to pharmacy.

## 2018-10-17 NOTE — Telephone Encounter (Signed)
Please instruct him to increase gabapentin as follows:  Week 1:  Continue 300mg  in AM and increase to 600mg  at bedtime (take 2 tablets)   Week 2:  Take 2 tablets (600mg ) twice daily.  Thanks.

## 2018-10-17 NOTE — Telephone Encounter (Signed)
Pain is frequent but not constant. Location is in his toes. They sting. Hurts to walk.   Gabapentin 300mg  BID. Takes one in am and one in pm.  Would like relief. He tripped on a rug as he was speaking to me but did not fall.

## 2018-11-21 ENCOUNTER — Telehealth (HOSPITAL_COMMUNITY): Payer: Self-pay | Admitting: *Deleted

## 2018-11-21 MED ORDER — FLECAINIDE ACETATE 50 MG PO TABS: 50.0000 mg | ORAL_TABLET | Freq: Two times a day (BID) | ORAL | 1 refills | Status: DC

## 2018-11-21 NOTE — Telephone Encounter (Addendum)
Patient called in stating he is having increased afib episodes. He is taking the PRN flecainide dosing every 2-3 weeks. Discussed with Roderic Palau NP will stop PRN dosing and start flecainide 50mg  twice a day starting Saturday. Follow up EKG appt made.

## 2018-11-22 ENCOUNTER — Encounter: Payer: Self-pay | Admitting: Neurology

## 2018-11-24 ENCOUNTER — Telehealth (INDEPENDENT_AMBULATORY_CARE_PROVIDER_SITE_OTHER): Payer: Medicare Other | Admitting: Neurology

## 2018-11-24 ENCOUNTER — Other Ambulatory Visit: Payer: Self-pay

## 2018-11-24 VITALS — Ht 70.0 in | Wt 265.0 lb

## 2018-11-24 DIAGNOSIS — C9000 Multiple myeloma not having achieved remission: Secondary | ICD-10-CM | POA: Diagnosis not present

## 2018-11-24 DIAGNOSIS — G63 Polyneuropathy in diseases classified elsewhere: Secondary | ICD-10-CM

## 2018-11-24 DIAGNOSIS — D472 Monoclonal gammopathy: Secondary | ICD-10-CM

## 2018-11-24 NOTE — Progress Notes (Signed)
Virtual Visit via Video Note The purpose of this virtual visit is to provide medical care while limiting exposure to the novel coronavirus.    Consent was obtained for video visit:  Yes.   Answered questions that patient had about telehealth interaction:  Yes.   I discussed the limitations, risks, security and privacy concerns of performing an evaluation and management service by telemedicine. I also discussed with the patient that there may be a patient responsible charge related to this service. The patient expressed understanding and agreed to proceed.  Pt location: Home Physician Location: office Name of referring provider:  Lajean Manes, MD I connected with [name] at patients initiation/request on 11/24/2018 at 10:30 AM EDT by video enabled telemedicine application and verified that I am speaking with the correct person using two identifiers. Pt MRN:  [id number] Pt DOB:  March 18, 1951 Video Participants:  [name]   History of Present Illness: This is a 68 y.o. male with right-handed Caucasian malewith atrial fibrillation s/p ablation (on eliquis), OSA on CPAP, GERD,andhypertensionreturning for follow-up of neuropathy.  The past few weeks, he has noticed greater intensity of numbness and heaviness of the feet.  He had called the office and was instructed to increase his gabapentin to 600 mg at bedtime, which has helped with sharp, stinging pain.  Neuropathy remains restricted to the toes and distal feet, it does not involve above the ankles.  He denies any weakness or imbalance.  He was evaluated and by Dr. Baltazar Najjar at [facility] oncology/hematology for smoldering myeloma and recent laboratory testing shows increased light chains ratio.  He will be having a MRI of the spine to evaluate for any osseous lesions.  He may also be enrolled in a clinical trial for smoldering myeloma.  Over the past month, he has had right hip pain and injection for bursitis significantly helped his.   He is been having episodes of atrial fibrillation with RVR and now is on maintenance flecainide.  Observations/Objective:   Vitals:   11/22/18 1355  Weight: 265 lb (120.2 kg)  Height: 5\' 10"  (1.778 m)   Patient is awake, alert, and appears comfortable.  Oriented x 4.   Extraocular muscles are intact. No ptosis.  Face is symmetric.  Speech is not dysarthric. Tongue is midline. Antigravity in all extremities.   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  I suspect that with his light chain ratios increasing, worsening neuropathy symptoms are associated with that.  It was explained that pain medication such as gabapentin only helps with painful paresthesias, and unfortunately does not improve any of the negative symptoms such as numbness, or heavy sensation.  I will keep him on gabapentin 300 mg in the morning and 600 mg at bedtime.  Fall precautions discussed.  He denies any imbalance or weakness currently.  He had many questions regarding management of neuropathy and spent significant time informing me of updates for other medical conditions including smoldering myeloma, bursitis, and atrial fibrillation.  Follow Up Instructions:   I discussed the assessment and treatment plan with the patient. The patient was provided an opportunity to ask questions and all were answered. The patient agreed with the plan and demonstrated an understanding of the instructions.   The patient was advised to call back or seek an in-person evaluation if the symptoms worsen or if the condition fails to improve as anticipated.  Follow-up in 6 months  Total time spent:  25 minutes  Alda Berthold, DO

## 2018-11-29 ENCOUNTER — Other Ambulatory Visit: Payer: Self-pay

## 2018-11-29 ENCOUNTER — Ambulatory Visit (HOSPITAL_COMMUNITY)
Admission: RE | Admit: 2018-11-29 | Discharge: 2018-11-29 | Disposition: A | Discharge status: Home / Self Care | Payer: Medicare Other | Source: Ambulatory Visit | Attending: Nurse Practitioner | Admitting: Nurse Practitioner

## 2018-11-29 VITALS — BP 136/72 | HR 60

## 2018-11-29 DIAGNOSIS — Z79899 Other long term (current) drug therapy: Secondary | ICD-10-CM | POA: Diagnosis not present

## 2018-11-29 DIAGNOSIS — I4891 Unspecified atrial fibrillation: Secondary | ICD-10-CM | POA: Diagnosis present

## 2018-11-29 DIAGNOSIS — Z7901 Long term (current) use of anticoagulants: Secondary | ICD-10-CM | POA: Diagnosis not present

## 2018-11-29 DIAGNOSIS — I480 Paroxysmal atrial fibrillation: Secondary | ICD-10-CM

## 2018-11-29 MED ORDER — FLECAINIDE ACETATE 50 MG PO TABS: 50.0000 mg | ORAL_TABLET | Freq: Two times a day (BID) | ORAL | 3 refills | Status: DC

## 2018-11-29 NOTE — Progress Notes (Addendum)
Pt in for an EKG since starting back on flecainide 50 bid on Saturday 11/25/2018.   To be reviewed by Roderic Palau, NP  Pt felt that he was having an up tick of afib since stopping flecainide  last winter. He was taking 200 mg flecainide as needed but felt he was requiring this too often, He called the office and he was placed back on flecainide 50 mg bid and has only has afib for 2 hours 7/20. He continues with 100 mg metoprolol succinate daily. EKg shows NSR at 60 bpm. PR int 188 ms, qrs int 90 ms, qtc 434 ms. He will continue on eliquis 5 mg bid. If this does not quiet his afib episodes down, he wishes to talk to Dr. Rayann Heman about repeat ablation. He has a f/u with him in November.

## 2019-01-02 ENCOUNTER — Other Ambulatory Visit (HOSPITAL_COMMUNITY): Payer: Self-pay | Admitting: Nurse Practitioner

## 2019-01-19 HISTORY — PX: OTHER SURGICAL HISTORY: SHX169

## 2019-01-22 ENCOUNTER — Telehealth (HOSPITAL_COMMUNITY): Payer: Self-pay

## 2019-01-22 MED ORDER — DILTIAZEM HCL 30 MG PO TABS: ORAL_TABLET | ORAL | 1 refills | Status: DC

## 2019-01-22 NOTE — Telephone Encounter (Signed)
Pt called stating that he recently had hip replacement surgery on 09.18.2020. He stated that he keeps going in and out of Afib. I informed Roderic Palau she recommend sending in Diltiazem 30mg  take every 4 hours as needed heart rate over 100 and bp 100.

## 2019-02-04 ENCOUNTER — Other Ambulatory Visit (HOSPITAL_COMMUNITY): Payer: Self-pay | Admitting: Nurse Practitioner

## 2019-02-21 ENCOUNTER — Other Ambulatory Visit (HOSPITAL_COMMUNITY): Payer: Self-pay | Admitting: Nurse Practitioner

## 2019-02-28 ENCOUNTER — Encounter (HOSPITAL_COMMUNITY): Payer: Self-pay

## 2019-02-28 ENCOUNTER — Encounter: Payer: Self-pay | Admitting: Internal Medicine

## 2019-02-28 ENCOUNTER — Telehealth (INDEPENDENT_AMBULATORY_CARE_PROVIDER_SITE_OTHER): Payer: Medicare Other | Admitting: Internal Medicine

## 2019-02-28 VITALS — BP 145/82 | HR 63 | Ht 70.0 in | Wt 255.0 lb

## 2019-02-28 DIAGNOSIS — I480 Paroxysmal atrial fibrillation: Secondary | ICD-10-CM

## 2019-02-28 DIAGNOSIS — I10 Essential (primary) hypertension: Secondary | ICD-10-CM | POA: Diagnosis not present

## 2019-02-28 DIAGNOSIS — G4733 Obstructive sleep apnea (adult) (pediatric): Secondary | ICD-10-CM | POA: Diagnosis not present

## 2019-02-28 NOTE — Progress Notes (Signed)
Electrophysiology TeleHealth Note   Due to national recommendations of social distancing due to COVID 19, an audio/video telehealth visit is felt to be most appropriate for this patient at this time.  See MyChart message from today for the patient's consent to telehealth for [facility].  Date:  02/28/2019   ID:  Travis [name], DOB Jan 01, 1951, MRN [id number]  Location: patient's home  Provider location:  [facility]  Evaluation Performed: Follow-up visit  PCP:  Lajean Manes, MD   Electrophysiologist:  Dr Rayann Heman  Chief Complaint:  palpitations  History of Present Illness:    Travis [name] is a 68 y.o. male who presents via telehealth conferencing today.  Since last being seen in our clinic, the patient reports doing reasonably well.  He had hip replacement in September.  He is making good recovery.  He thinks that he is having afib about once per month.  Episodes last about 4 hours. He is unaware of triggers/ precipitants. Today, he denies symptoms of chest pain, shortness of breath,  lower extremity edema, dizziness, presyncope, or syncope.  The patient is otherwise without complaint today.  The patient denies symptoms of fevers, chills, cough, or new SOB worrisome for COVID 19.  Past Medical History:  Diagnosis Date  . Arthritis    "comes and goes; mostly in hands, occasionally elbow" (04/05/2017)  . Complication of anesthesia    "just wanted to sleep alot  for couple days S/P VARICOCELE OR" (04/05/2017)  . DVT (deep venous thrombosis) (HCC)    LLE  . Dysrhythmia    PVCs   . GERD (gastroesophageal reflux disease)   . History of hiatal hernia   . Hypertension   . Impaired glucose tolerance   . Myeloma ([place])   . Obesity (BMI 30-39.9) 07/26/2016  . OSA on CPAP 07/26/2016   Mild with AHI 12.5/hr now on CPAP at 14cm H2O  . Paroxysmal atrial fibrillation (HCC)   . Pneumonia    "may have had walking pneumonia a few years ago" (04/05/2017)  . Thrombophlebitis     Past Surgical History:  Procedure Laterality Date  . ATRIAL FIBRILLATION ABLATION  04/05/2017  . ATRIAL FIBRILLATION ABLATION N/A 04/05/2017   Procedure: ATRIAL FIBRILLATION ABLATION;  Surgeon: Thompson Grayer, MD;  Location: [facility];  Service: Cardiovascular;  Laterality: N/A;  . EVALUATION UNDER ANESTHESIA WITH HEMORRHOIDECTOMY N/A 02/24/2017   Procedure: EXAM UNDER ANESTHESIA WITH  HEMORRHOIDECTOMY;  Surgeon: Michael Boston, MD;  Location: [facility];  Service: General;  Laterality: N/A;  . FLEXIBLE SIGMOIDOSCOPY N/A 04/15/2015   Procedure:  UNSEDATED FLEXIBLE SIGMOIDOSCOPY;  Surgeon: [name], MD;  Location: [facility];  Service: Endoscopy;  Laterality: N/A;  . KNEE ARTHROSCOPY Left ~ 2016  . 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 TONSILLECTOMY    . VARICOSE VEIN SURGERY Bilateral     Current Outpatient Medications  Medication Sig Dispense Refill  . acetaminophen (TYLENOL) 500 MG tablet Take 500-1,000 mg by mouth every 6 (six) hours as needed for mild pain, fever or headache.    [name] amLODipine (NORVASC) 10 MG tablet Take 10 mg by mouth daily.     [name] b complex vitamins tablet Take 1 tablet by mouth daily.    . cyanocobalamin (,VITAMIN B-12,) 1000 MCG/ML injection Inject 1,000 mcg into the muscle every 30 (thirty) days.     [name] diltiazem (CARDIZEM) 30 MG tablet Take 1 Tablet Every 4 Hours As Needed For Afib HR Over 100. If  using frequently please call our office [phone] 30 tablet 1  . ELIQUIS 5 MG TABS tablet TAKE ONE TABLET BY MOUTH TWICE A DAY 60 tablet 6  . flecainide (TAMBOCOR) 50 MG tablet Take 1 tablet (50 mg total) by mouth 2 (two) times daily. 60 tablet 3  . gabapentin (NEURONTIN) 300 MG capsule Take 300mg  qam and then 600mg  qpm on week one  Take 600mg  qam and then 600mg  qpm on week two 90 capsule 3  . hydrochlorothiazide (MICROZIDE) 12.5 MG capsule     . lisinopril (ZESTRIL) 30 MG tablet     . Melatonin 10 MG TABS Take 2 tablets by mouth at bedtime.    .  metoprolol succinate (TOPROL-XL) 100 MG 24 hr tablet     . Multiple Vitamins-Minerals (MULTIVITAMIN PO) Take 1 tablet by mouth 2 (two) times a week.     . psyllium (METAMUCIL) 58.6 % powder Take 1 packet by mouth every evening.     . sodium chloride (OCEAN) 0.65 % SOLN nasal spray Place 1 spray into both nostrils as needed for congestion.    . Vitamin D, Ergocalciferol, (DRISDOL) 1.25 MG (50000 UT) CAPS capsule TAKE ONE CAPSULE BY MOUTH ONCE WEEKLY 4 capsule 3   No current facility-administered medications for this visit.     Allergies:   Patient has no known allergies.   Social History:  The patient  reports that he has never smoked. He has never used smokeless tobacco. He reports current alcohol use of about 14.0 standard drinks of alcohol per week. He reports that he does not use drugs.   Family History:  The patient's family history includes Atrial fibrillation in his father; Dementia in his maternal grandmother and mother; Hypertension in his father and paternal grandfather; Stroke in his father.   ROS:  Please see the history of present illness.   All other systems are personally reviewed and negative.    Exam:    Vital Signs:  BP (!) 145/82   Pulse 63   Ht 5\' 10"  (1.778 m)   Wt 255 lb (115.7 kg)   BMI 36.59 kg/m   Well sounding and appearing, alert and conversant, regular work of breathing,  good skin color Eyes- anicteric, neuro- grossly intact, skin- no apparent rash or lesions or cyanosis, mouth- oral mucosa is pink  Labs/Other Tests and Data Reviewed:    Recent Labs: 06/29/2018: ALT 45 07/10/2018: BUN 20; Creatinine, Ser 0.83; Hemoglobin 15.8; Platelets 236; Potassium 3.7; Sodium 138   Wt Readings from Last 3 Encounters:  02/28/19 255 lb (115.7 kg)  11/22/18 265 lb (120.2 kg)  10/09/18 269 lb (122 kg)     ASSESSMENT & PLAN:    1.  Paroxysmal atrial fibrillation He has done very well post ablation He continues to take flecainide and has occasional palpitations.  He has [employer] and will seen me transmissions to review to see if this is afib. chads2vas score is 2.  He is on eliquis  2. OSA Compliant with CPAP  3. ETOH Avoidance encouraged  4. Obesity Body mass index is 36.59 kg/m. Lifestyle modification is encouraged He has lost about 14 lbs over the summer.  5. HTN Stable No change required today   Follow-up:  AF clinic 3 months   Patient Risk:  after full review of this patients clinical status, I feel that they are at moderate risk at this time.  Today, I have spent 15 minutes with the patient with telehealth technology discussing arrhythmia management .  Army Fossa, MD  02/28/2019 12:02 PM     [address] [place] [address] [phone] (office) [phone] (fax)

## 2019-03-05 ENCOUNTER — Other Ambulatory Visit: Payer: Self-pay | Admitting: Neurology

## 2019-03-07 ENCOUNTER — Telehealth: Payer: Self-pay | Admitting: Hematology and Oncology

## 2019-03-07 NOTE — Telephone Encounter (Signed)
Returned patient's phone call regarding rescheduling an appointment, per patient's request 11/05 and 11/12 appointment has been moved to 11/18 and 11/25.

## 2019-03-08 ENCOUNTER — Inpatient Hospital Stay: Payer: Medicare Other

## 2019-03-15 ENCOUNTER — Ambulatory Visit: Payer: 59 | Admitting: Hematology and Oncology

## 2019-03-21 ENCOUNTER — Inpatient Hospital Stay: Payer: Medicare Other | Attending: Hematology and Oncology

## 2019-03-21 ENCOUNTER — Other Ambulatory Visit: Payer: Self-pay

## 2019-03-21 DIAGNOSIS — Z7901 Long term (current) use of anticoagulants: Secondary | ICD-10-CM | POA: Insufficient documentation

## 2019-03-21 DIAGNOSIS — C9000 Multiple myeloma not having achieved remission: Secondary | ICD-10-CM | POA: Diagnosis not present

## 2019-03-21 DIAGNOSIS — Z79899 Other long term (current) drug therapy: Secondary | ICD-10-CM | POA: Insufficient documentation

## 2019-03-21 DIAGNOSIS — M1990 Unspecified osteoarthritis, unspecified site: Secondary | ICD-10-CM | POA: Diagnosis not present

## 2019-03-21 DIAGNOSIS — D472 Monoclonal gammopathy: Secondary | ICD-10-CM

## 2019-03-21 LAB — CBC WITH DIFFERENTIAL (CANCER CENTER ONLY)
Abs Immature Granulocytes: 0.03 10*3/uL (ref 0.00–0.07)
Basophils Absolute: 0 10*3/uL (ref 0.0–0.1)
Basophils Relative: 0 %
Eosinophils Absolute: 0.3 10*3/uL (ref 0.0–0.5)
Eosinophils Relative: 3 %
HCT: 43.9 % (ref 39.0–52.0)
Hemoglobin: 14.9 g/dL (ref 13.0–17.0)
Immature Granulocytes: 0 %
Lymphocytes Relative: 13 %
Lymphs Abs: 1.2 10*3/uL (ref 0.7–4.0)
MCH: 31.5 pg (ref 26.0–34.0)
MCHC: 33.9 g/dL (ref 30.0–36.0)
MCV: 92.8 fL (ref 80.0–100.0)
Monocytes Absolute: 1 10*3/uL (ref 0.1–1.0)
Monocytes Relative: 10 %
Neutro Abs: 6.8 10*3/uL (ref 1.7–7.7)
Neutrophils Relative %: 74 %
Platelet Count: 269 10*3/uL (ref 150–400)
RBC: 4.73 MIL/uL (ref 4.22–5.81)
RDW: 13 % (ref 11.5–15.5)
WBC Count: 9.3 10*3/uL (ref 4.0–10.5)
nRBC: 0 % (ref 0.0–0.2)

## 2019-03-21 LAB — CMP (CANCER CENTER ONLY)
ALT: 20 U/L (ref 0–44)
AST: 16 U/L (ref 15–41)
Albumin: 3.9 g/dL (ref 3.5–5.0)
Alkaline Phosphatase: 122 U/L (ref 38–126)
Anion gap: 12 (ref 5–15)
BUN: 11 mg/dL (ref 8–23)
CO2: 26 mmol/L (ref 22–32)
Calcium: 9.2 mg/dL (ref 8.9–10.3)
Chloride: 103 mmol/L (ref 98–111)
Creatinine: 0.87 mg/dL (ref 0.61–1.24)
GFR, Est AFR Am: >60 mL/min (ref >60)
GFR, Estimated: >60 mL/min (ref >60)
Glucose, Bld: 125 mg/dL — ABNORMAL HIGH (ref 70–99)
Potassium: 3.7 mmol/L (ref 3.5–5.1)
Sodium: 141 mmol/L (ref 135–145)
Total Bilirubin: 0.3 mg/dL (ref 0.3–1.2)
Total Protein: 7 g/dL (ref 6.5–8.1)

## 2019-03-21 LAB — LACTATE DEHYDROGENASE: LDH: 151 U/L (ref 98–192)

## 2019-03-22 LAB — KAPPA/LAMBDA LIGHT CHAINS
Kappa free light chain: 268.5 mg/L — ABNORMAL HIGH (ref 3.3–19.4)
Kappa, lambda light chain ratio: 26.07 — ABNORMAL HIGH (ref 0.26–1.65)
Lambda free light chains: 10.3 mg/L (ref 5.7–26.3)

## 2019-03-23 LAB — MULTIPLE MYELOMA PANEL, SERUM
Albumin SerPl Elph-Mcnc: 3.5 g/dL (ref 2.9–4.4)
Albumin/Glob SerPl: 1.3 (ref 0.7–1.7)
Alpha 1: 0.3 g/dL (ref 0.0–0.4)
Alpha2 Glob SerPl Elph-Mcnc: 0.7 g/dL (ref 0.4–1.0)
B-Globulin SerPl Elph-Mcnc: 1.4 g/dL — ABNORMAL HIGH (ref 0.7–1.3)
Gamma Glob SerPl Elph-Mcnc: 0.4 g/dL (ref 0.4–1.8)
Globulin, Total: 2.8 g/dL (ref 2.2–3.9)
IgA: 483 mg/dL — ABNORMAL HIGH (ref 61–437)
IgG (Immunoglobin G), Serum: 509 mg/dL — ABNORMAL LOW (ref 603–1613)
IgM (Immunoglobulin M), Srm: 25 mg/dL (ref 20–172)
M Protein SerPl Elph-Mcnc: 0.4 g/dL — ABNORMAL HIGH
Total Protein ELP: 6.3 g/dL (ref 6.0–8.5)

## 2019-03-27 NOTE — Progress Notes (Signed)
Patient Care Team: Lajean Manes, MD as PCP - General (Internal Medicine) Fay Records, MD as PCP - Cardiology (Cardiology) Sueanne Margarita, MD as PCP - Sleep Medicine (Cardiology) Thompson Grayer, MD as PCP - Electrophysiology (Cardiology) Michael Boston, MD as Consulting Physician (General Surgery) Clarene Essex, MD as Consulting Physician (Gastroenterology) Sueanne Margarita, MD as Consulting Physician (Sleep Medicine) Alda Berthold, DO as Consulting Physician (Neurology)  DIAGNOSIS:    ICD-10-CM   1. Smoldering multiple myeloma (HCC)  C90.00 Beta 2 microglobulin, serum    Kappa/lambda light chains    Multiple Myeloma Panel (SPEP&IFE w/QIG)    CBC with Differential (Cancer Center Only)    CMP ([place] only)    Lactate dehydrogenase (LDH)    SUMMARY OF ONCOLOGIC HISTORY: Oncology History  Smoldering multiple myeloma ([place])  07/21/2017 Initial Diagnosis   Smoldering multiple myeloma ([place])   09/13/2017 Tumor Marker   tProt 6.8, Alb 3.8, Ca 9.3, Cr 0.9, AP 98 LDH 184, beta-2 microglobulin 1.4 SPEP -- M-spike 0.5g/dL, SIFE -- IgA kappa IgG 519, IgA 544, IgM 29; kappa 119.3, lambda 8.5, KLR 14.04 WBC 7.6, Hgb 14.6, Plt 208     CHIEF COMPLIANT: Follow-up of smoldering myeloma  INTERVAL HISTORY: Travis [name] is a 68 y.o. with above-mentioned history of smoldering myeloma. He is followed by Travis [name] at [facility] and was last seen on 03/09/19. Labs on 03/21/19 showed LDH 151, IgG 509, IgA 483, IgM 25, m-protein 0.4, kappa/lambda light chain ratio 26.07. He presents to the clinic today for follow-up to review his blood work.   REVIEW OF SYSTEMS:   Constitutional: Denies fevers, chills or abnormal weight loss Eyes: Denies blurriness of vision Ears, nose, mouth, throat, and face: Denies mucositis or sore throat Respiratory: Denies cough, dyspnea or wheezes Cardiovascular: Denies palpitation, chest discomfort Gastrointestinal: Denies nausea, heartburn or change in bowel habits  Skin: Denies abnormal skin rashes Lymphatics: Denies new lymphadenopathy or easy bruising Neurological: Denies numbness, tingling or new weaknesses Behavioral/Psych: Mood is stable, no new changes  Extremities: No lower extremity edema All other systems were reviewed with the patient and are negative.  I have reviewed the past medical history, past surgical history, social history and family history with the patient and they are unchanged from previous note.  ALLERGIES:  has No Known Allergies.  MEDICATIONS:  Current Outpatient Medications  Medication Sig Dispense Refill  . acetaminophen (TYLENOL) 500 MG tablet Take 500-1,000 mg by mouth every 6 (six) hours as needed for mild pain, fever or headache.    [name] amLODipine (NORVASC) 10 MG tablet Take 10 mg by mouth daily.     [name] b complex vitamins tablet Take 1 tablet by mouth daily.    . cyanocobalamin (,VITAMIN B-12,) 1000 MCG/ML injection Inject 1,000 mcg into the muscle every 30 (thirty) days.     [name] diltiazem (CARDIZEM) 30 MG tablet Take 1 Tablet Every 4 Hours As Needed For Afib HR Over 100. If using frequently please call our office [phone] 30 tablet 1  . ELIQUIS 5 MG TABS tablet TAKE ONE TABLET BY MOUTH TWICE A DAY 60 tablet 6  . flecainide (TAMBOCOR) 50 MG tablet Take 1 tablet (50 mg total) by mouth 2 (two) times daily. 60 tablet 3  . gabapentin (NEURONTIN) 300 MG capsule TAKE TWO CAPSULES BY MOUTH TWICE A DAY 90 capsule 2  . hydrochlorothiazide (MICROZIDE) 12.5 MG capsule     . lisinopril (ZESTRIL) 30 MG tablet     . Melatonin 10  MG TABS Take 2 tablets by mouth at bedtime.    . metoprolol succinate (TOPROL-XL) 100 MG 24 hr tablet     . Multiple Vitamins-Minerals (MULTIVITAMIN PO) Take 1 tablet by mouth 2 (two) times a week.     . psyllium (METAMUCIL) 58.6 % powder Take 1 packet by mouth every evening.     . sodium chloride (OCEAN) 0.65 % SOLN nasal spray Place 1 spray into both nostrils as needed for congestion.    . Vitamin D,  Ergocalciferol, (DRISDOL) 1.25 MG (50000 UT) CAPS capsule TAKE ONE CAPSULE BY MOUTH ONCE WEEKLY 4 capsule 3   No current facility-administered medications for this visit.     PHYSICAL EXAMINATION: ECOG PERFORMANCE STATUS: 1 - Symptomatic but completely ambulatory  Vitals:   03/28/19 1111  BP: (!) 150/78  Pulse: 61  Resp: 17  Temp: 98 F (36.7 C)  SpO2: 98%   Filed Weights   03/28/19 1111  Weight: 263 lb 1.6 oz (119.3 kg)    GENERAL: alert, no distress and comfortable SKIN: skin color, texture, turgor are normal, no rashes or significant lesions EYES: normal, Conjunctiva are pink and non-injected, sclera clear OROPHARYNX: no exudate, no erythema and lips, buccal mucosa, and tongue normal  NECK: supple, thyroid normal size, non-tender, without nodularity LYMPH: no palpable lymphadenopathy in the cervical, axillary or inguinal LUNGS: clear to auscultation and percussion with normal breathing effort HEART: regular rate & rhythm and no murmurs and no lower extremity edema ABDOMEN: abdomen soft, non-tender and normal bowel sounds MUSCULOSKELETAL: no cyanosis of digits and no clubbing  NEURO: alert & oriented x 3 with fluent speech, no focal motor/sensory deficits EXTREMITIES: No lower extremity edema  LABORATORY DATA:  I have reviewed the data as listed CMP Latest Ref Rng & Units 03/21/2019 07/10/2018 06/29/2018  Glucose 70 - 99 mg/dL 125(H) 95 101(H)  BUN 8 - 23 mg/dL '11 20 13  ' Creatinine 0.61 - 1.24 mg/dL 0.87 0.83 0.94  Sodium 135 - 145 mmol/L 141 138 141  Potassium 3.5 - 5.1 mmol/L 3.7 3.7 3.5  Chloride 98 - 111 mmol/L 103 104 102  CO2 22 - 32 mmol/L '26 25 27  ' Calcium 8.9 - 10.3 mg/dL 9.2 9.0 9.3  Total Protein 6.5 - 8.1 g/dL 7.0 - 7.1  Total Bilirubin 0.3 - 1.2 mg/dL 0.3 - 0.4  Alkaline Phos 38 - 126 U/L 122 - 90  AST 15 - 41 U/L 16 - 29  ALT 0 - 44 U/L 20 - 45(H)    Lab Results  Component Value Date   WBC 9.3 03/21/2019   HGB 14.9 03/21/2019   HCT 43.9 03/21/2019    MCV 92.8 03/21/2019   PLT 269 03/21/2019   NEUTROABS 6.8 03/21/2019    ASSESSMENT & PLAN:  Smoldering multiple myeloma ([place]) Lab review  03/21/2019:Kappa 268 (was 112: 9 months ago), lambda 10.3, ratio 26.07 (it was 15.18), M protein 0.4 g (was 0.6 g) Hemoglobin 14.9, calcium 9.2, creatinine 0.87  Review: I discussed with the patient that Kappa is certainly increasing however the M protein has come down.  No clinical signs of progression of disease Patient is being followed at [facility] by Travis [name].  He is going to see her back to [facility] in July for follow-up.  Back issues: Related to arthritis Return to clinic in 1 year for follow-up    Orders Placed This Encounter  Procedures  . Beta 2 microglobulin, serum    Standing Status:   Future  Standing Expiration Date:   05/01/2020  . Kappa/lambda light chains    Standing Status:   Future    Standing Expiration Date:   05/01/2020  . Multiple Myeloma Panel (SPEP&IFE w/QIG)    Standing Status:   Future    Standing Expiration Date:   05/01/2020  . CBC with Differential (Cancer Center Only)    Standing Status:   Future    Standing Expiration Date:   03/27/2020  . CMP ([place] only)    Standing Status:   Future    Standing Expiration Date:   03/27/2020  . Lactate dehydrogenase (LDH)    Standing Status:   Future    Standing Expiration Date:   03/27/2020   The patient has a good understanding of the overall plan. he agrees with it. he will call with any problems that may develop before the next visit here.  Nicholas Lose, MD 03/28/2019  Julious Oka Dorshimer, am acting as scribe for Dr. Nicholas Lose.  I have reviewed the above documentation for accuracy and completeness, and I agree with the above.

## 2019-03-28 ENCOUNTER — Inpatient Hospital Stay: Payer: Medicare Other | Admitting: Hematology and Oncology

## 2019-03-28 ENCOUNTER — Other Ambulatory Visit: Payer: Self-pay

## 2019-03-28 DIAGNOSIS — C9000 Multiple myeloma not having achieved remission: Secondary | ICD-10-CM | POA: Diagnosis not present

## 2019-03-28 DIAGNOSIS — D472 Monoclonal gammopathy: Secondary | ICD-10-CM

## 2019-03-28 NOTE — Assessment & Plan Note (Signed)
Lab review  03/21/2019:Kappa 268 (was 112: 9 months ago), lambda 10.3, ratio 26.07 (it was 15.18), M protein 0.4 g (was 0.6 g) Hemoglobin 14.9, calcium 9.2, creatinine 0.87  Review: I discussed with the patient that Kappa is certainly increasing however the M protein has come down.  No clinical signs of progression of disease Patient is being followed at [facility] by Dr. Baltazar Najjar.  He is going to see her back in July for follow-up.  Back issues: Related to arthritis Return to clinic in 1 year for follow-up

## 2019-04-07 IMAGING — CR DG BONE SURVEY MET
8 of 10 series · 8 of 10 positions shown · non-contrast
Comparison: Head CT 10/10/2016.  Chest x-ray 10/10/2016

CLINICAL DATA: MGUS

EXAM:
METASTATIC BONE SURVEY

[w chest pa]
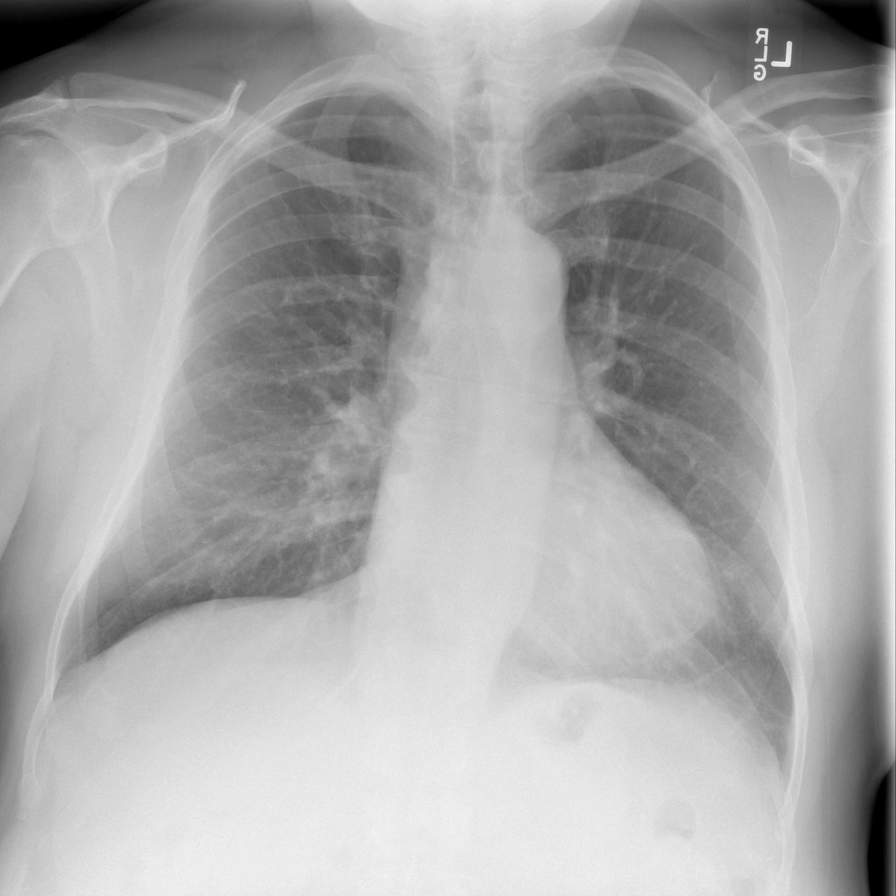

[w c-spine lat]
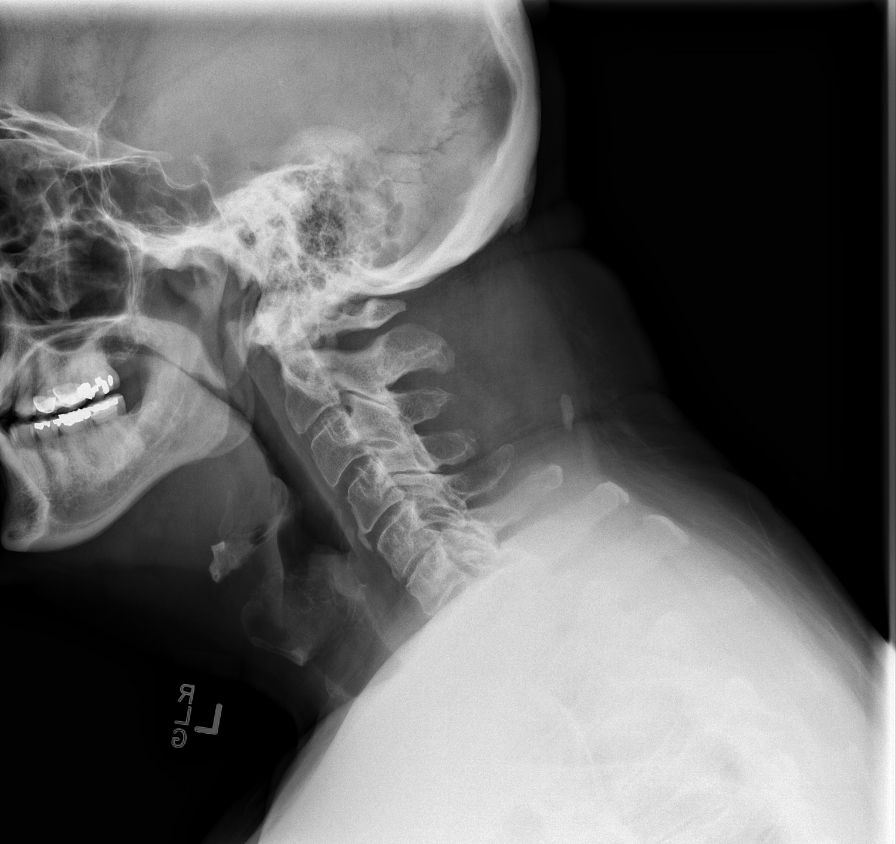

[w skull lat *]
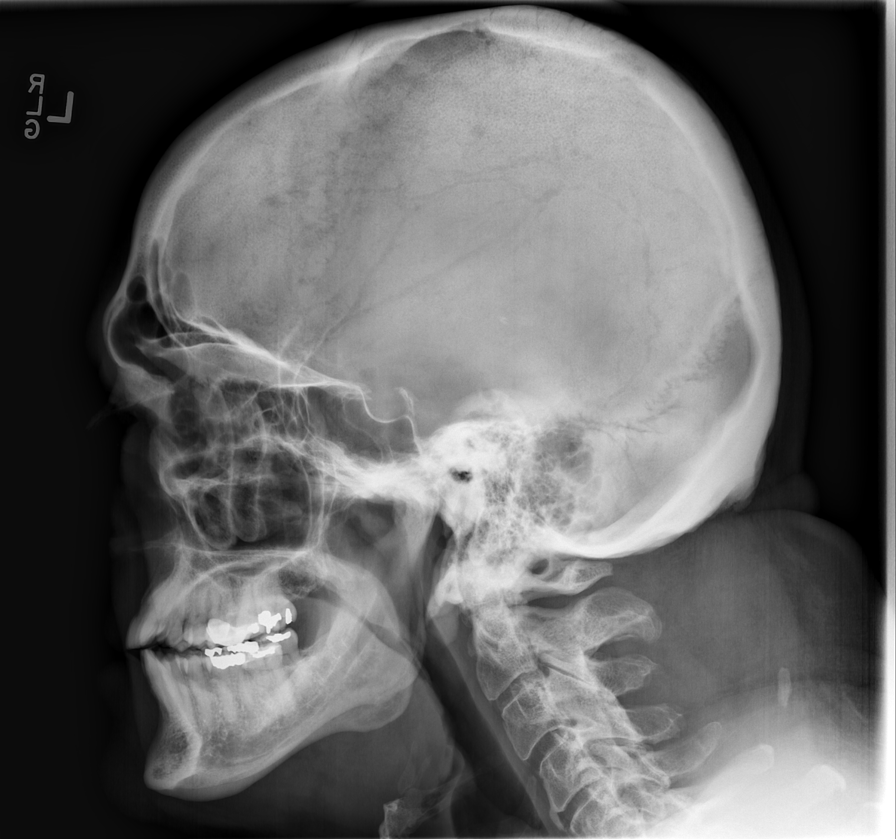

[w c-spine a.p. *]
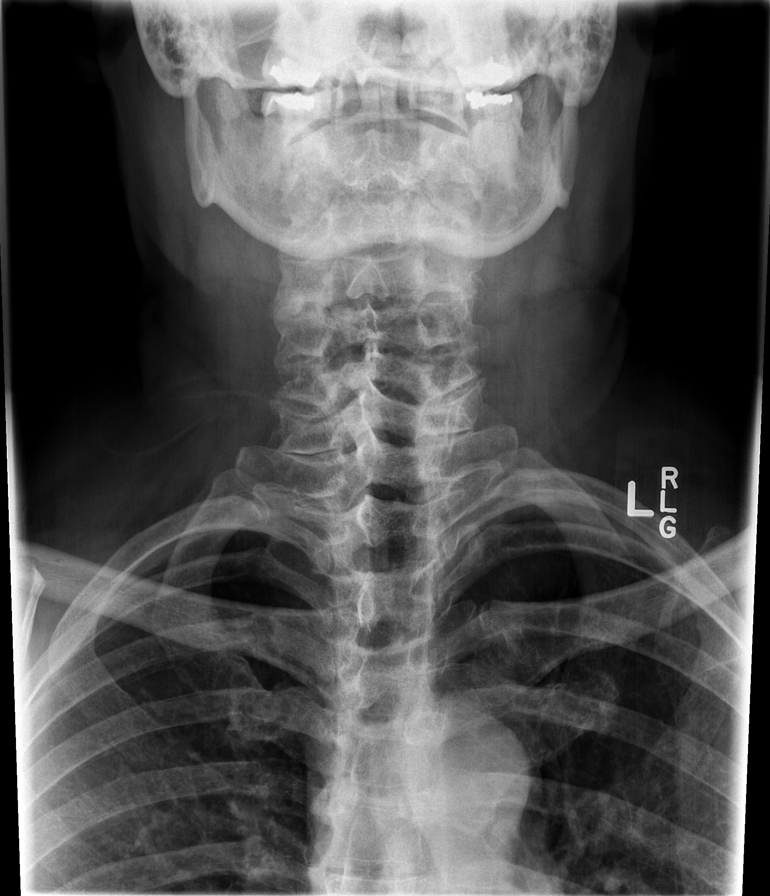

[w shoulder ap external left *]
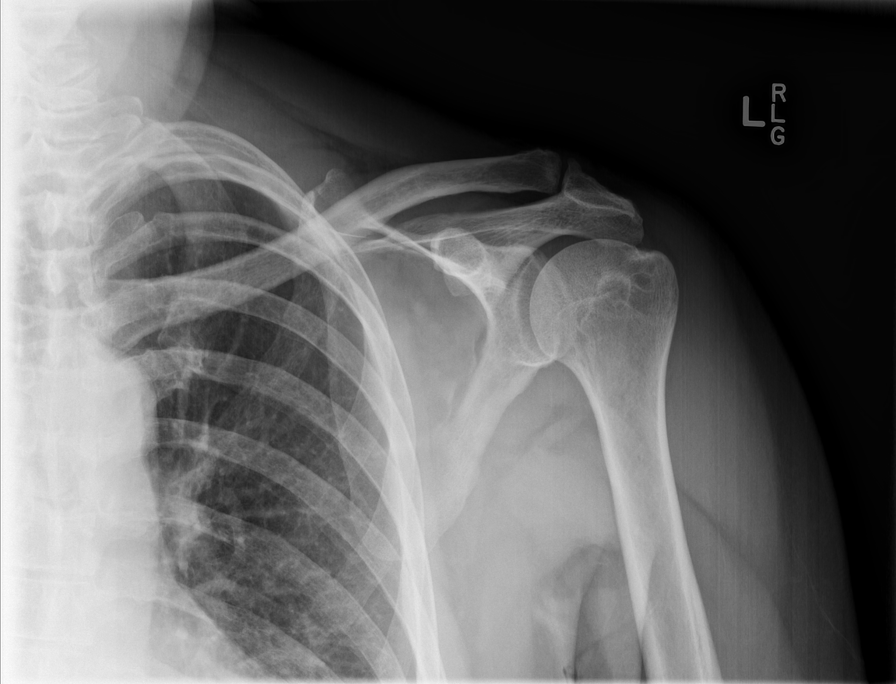

[w shoulder ap external right *]
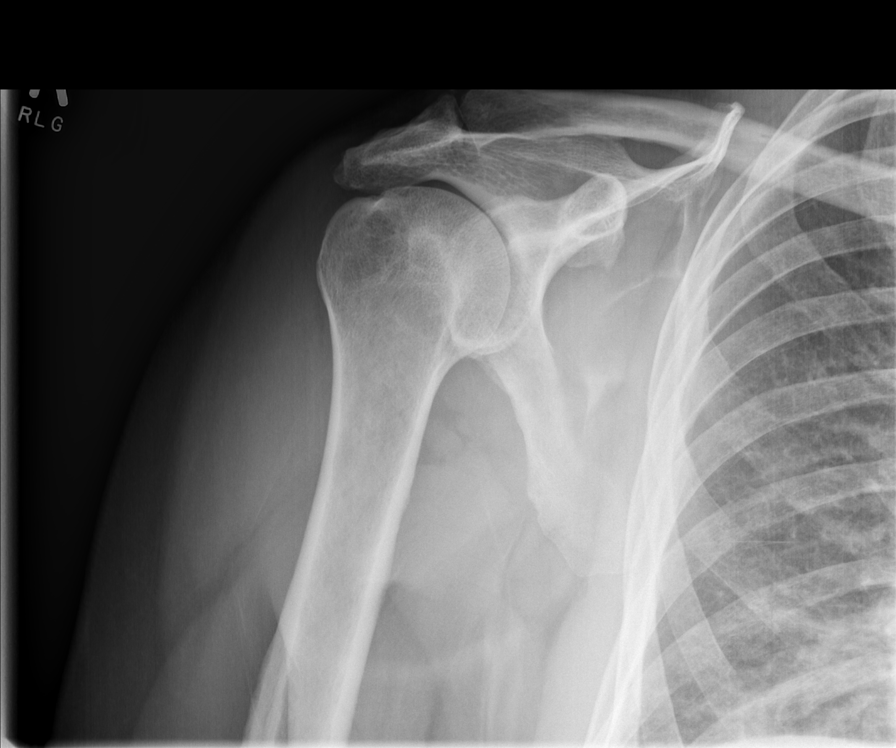

[w humerus ap right *]
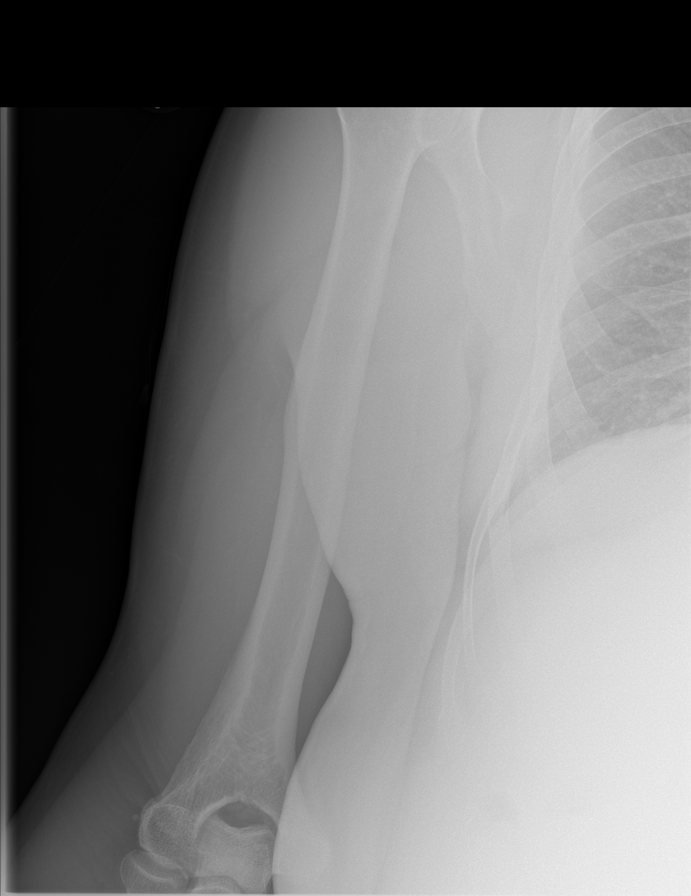

[w humerus ap left *]
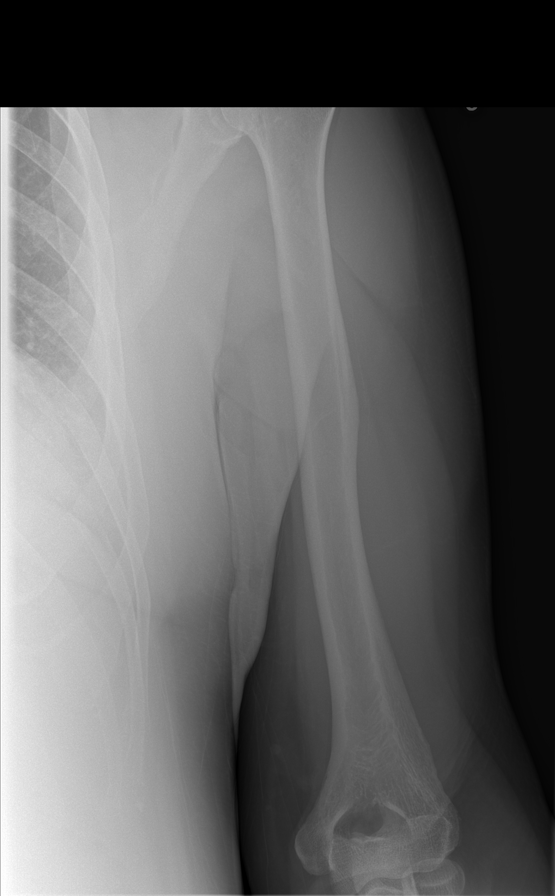

[8 of 10 positions shown; findings below may reference images not displayed]

FINDINGS: Lateral view of the skull shows multiple subcentimeter ill-defined
lucent areas. These are stable from CT scanogram in 0536 when no
aggressive lesion was seen on CT slices. No suspected myeloma in the
skull.

No visible spinal lesion or fracture. There is chronic L4-5 and
L5-S1 disc height loss and lumbar mild levoscoliosis.

No visible extremity lesion. Lucencies in the proximal left humerus
are attributed to synovial inclusion spits.

No visible pelvic or rib lesion.

No evidence of acute cardiopulmonary disease.
IMPRESSION: No suspected myeloma focus. There are lucencies over the calvarium
that are stable from 0536 when they had a benign appearance by CT.

## 2019-04-27 ENCOUNTER — Other Ambulatory Visit (HOSPITAL_COMMUNITY): Payer: Self-pay | Admitting: Nurse Practitioner

## 2019-04-30 ENCOUNTER — Other Ambulatory Visit (HOSPITAL_COMMUNITY): Payer: Self-pay

## 2019-05-08 ENCOUNTER — Other Ambulatory Visit (HOSPITAL_COMMUNITY): Payer: Self-pay | Admitting: Nurse Practitioner

## 2019-05-09 ENCOUNTER — Encounter: Payer: Self-pay | Admitting: Neurology

## 2019-05-10 ENCOUNTER — Other Ambulatory Visit: Payer: Medicare Other

## 2019-05-10 ENCOUNTER — Other Ambulatory Visit: Payer: Self-pay

## 2019-05-10 ENCOUNTER — Telehealth (INDEPENDENT_AMBULATORY_CARE_PROVIDER_SITE_OTHER): Payer: Medicare Other | Admitting: Neurology

## 2019-05-10 VITALS — Ht 70.0 in | Wt 260.0 lb

## 2019-05-10 DIAGNOSIS — E639 Nutritional deficiency, unspecified: Secondary | ICD-10-CM | POA: Diagnosis not present

## 2019-05-10 DIAGNOSIS — Z7289 Other problems related to lifestyle: Secondary | ICD-10-CM | POA: Diagnosis not present

## 2019-05-10 DIAGNOSIS — C9000 Multiple myeloma not having achieved remission: Secondary | ICD-10-CM

## 2019-05-10 DIAGNOSIS — G63 Polyneuropathy in diseases classified elsewhere: Secondary | ICD-10-CM | POA: Diagnosis not present

## 2019-05-10 DIAGNOSIS — D472 Monoclonal gammopathy: Secondary | ICD-10-CM

## 2019-05-10 DIAGNOSIS — Z789 Other specified health status: Secondary | ICD-10-CM

## 2019-05-10 NOTE — Progress Notes (Signed)
Virtual Visit via Video Note The purpose of this virtual visit is to provide medical care while limiting exposure to the novel coronavirus.    Consent was obtained for video visit:  Yes.   Answered questions that patient had about telehealth interaction:  Yes.   I discussed the limitations, risks, security and privacy concerns of performing an evaluation and management service by telemedicine. I also discussed with the patient that there may be a patient responsible charge related to this service. The patient expressed understanding and agreed to proceed.  Pt location: Home Physician Location: office Name of referring provider:  Lajean Manes, MD I connected with [name] at patients initiation/request on 05/10/2019 at 10:10 AM EST by video enabled telemedicine application and verified that I am speaking with the correct person using two identifiers. Pt MRN:  [id number] Pt DOB:  28-Aug-1950 Video Participants:  [name]   History of Present Illness: This is a 69 y.o. male returning for follow-up of neuropathy and new complaints of bilateral arm pain.  Over the past few months, he has noticed worsening numbness in the left > right foot.  His myeloma labs have been looking great.  Upon further questioning, he endorses drinking about half a bottle of wine nightly for many years.  Labs were recently checked at [facility] where his vitamin B12 was found to be low normal (237, normal > 123) and he was started on monthly injections.  He had right hip surgery in September 2020, and since this time he feels that he has been supporting his weight with his arms much more to help him with transfers and getting up out of bed.  He complains of achy pain in the biceps and forearm region.  He denies radicular pain or numbness and tingling of the arms.  Observations/Objective:   Vitals:   05/09/19 1141  Weight: 260 lb (117.9 kg)  Height: 5\' 10"  (1.778 m)   Patient is awake, alert, and appears  comfortable.  Oriented x 4.   Face is symmetric.  Speech is not dysarthric.  Antigravity in all extremities.  No pronator drift.  He has difficulty fully extending fingers on the right hand due to tightness and pain in the palm.   Assessment and Plan:  1.  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  I suspect that her vitamin deficiency is also contributing to his worsening symptoms. Patient inform that chronic alcohol consumption can directly be neurotoxic, but also lead to nutrient deficiency causing neuropathy.  He was recently found to have low  vitamin B12 and is taking monthly injections. I will check vitamin B1 and folate level He was encouraged to try to cut back on alcohol to sparingly throughout the week For painful paresthesias, he can try increasing nighttime gabapentin to 900 mg at bedtime and continue 300 mg in the morning.  If this provides relief, he will call the office as he will need a new prescription.    2.  Bilateral arm pain seems to be more musculoskeletal.  He does not have any neuropathic features to suggest this is nerve-related.  He will be seeing his primary care doctor today for this.   Follow Up Instructions:   I discussed the assessment and treatment plan with the patient. The patient was provided an opportunity to ask questions and all were answered. The patient agreed with the plan and demonstrated an understanding of the instructions.   The patient was advised to call back  or seek an in-person evaluation if the symptoms worsen or if the condition fails to improve as anticipated.   Alda Berthold, DO

## 2019-05-15 LAB — VITAMIN B1: Vitamin B1 (Thiamine): 27 nmol/L (ref 8–30)

## 2019-05-15 LAB — FOLATE: Folate: 19.1 ng/mL

## 2019-05-23 ENCOUNTER — Ambulatory Visit: Payer: Medicare Other | Attending: Internal Medicine

## 2019-05-23 ENCOUNTER — Other Ambulatory Visit: Payer: Self-pay | Admitting: Neurology

## 2019-05-23 DIAGNOSIS — Z23 Encounter for immunization: Secondary | ICD-10-CM | POA: Insufficient documentation

## 2019-05-23 NOTE — Progress Notes (Signed)
   Covid-19 Vaccination Clinic  Name:  Travis [name]    MRN: [id number] DOB: 05-May-1950  05/23/2019  [name] was observed post Covid-19 immunization for 15 minutes without incidence. He was provided with Vaccine Information Sheet and instruction to access the V-Safe system.   [name] was instructed to call 911 with any severe reactions post vaccine: [name] Difficulty breathing  . Swelling of your face and throat  . A fast heartbeat  . A bad rash all over your body  . Dizziness and weakness    Immunizations Administered    Name Date Dose VIS Date Route   Pfizer COVID-19 Vaccine 05/23/2019  4:37 PM 0.3 mL 04/13/2019 Intramuscular   Manufacturer: [employer]   Lot: [id number]   [place]: [id number]

## 2019-05-28 ENCOUNTER — Telehealth: Payer: Medicare Other | Admitting: Neurology

## 2019-06-05 ENCOUNTER — Other Ambulatory Visit: Payer: Self-pay | Admitting: Orthopaedic Surgery

## 2019-06-05 DIAGNOSIS — M25512 Pain in left shoulder: Secondary | ICD-10-CM

## 2019-06-05 DIAGNOSIS — G8929 Other chronic pain: Secondary | ICD-10-CM

## 2019-06-11 ENCOUNTER — Ambulatory Visit (HOSPITAL_COMMUNITY)
Admission: RE | Admit: 2019-06-11 | Discharge: 2019-06-11 | Disposition: A | Discharge status: Home / Self Care | Payer: Medicare Other | Source: Ambulatory Visit | Attending: Nurse Practitioner | Admitting: Nurse Practitioner

## 2019-06-11 ENCOUNTER — Encounter (HOSPITAL_COMMUNITY): Payer: Self-pay | Admitting: Nurse Practitioner

## 2019-06-11 ENCOUNTER — Other Ambulatory Visit: Payer: Self-pay

## 2019-06-11 VITALS — BP 140/78 | HR 56 | Ht 70.0 in | Wt 265.2 lb

## 2019-06-11 DIAGNOSIS — G4733 Obstructive sleep apnea (adult) (pediatric): Secondary | ICD-10-CM | POA: Insufficient documentation

## 2019-06-11 DIAGNOSIS — Z823 Family history of stroke: Secondary | ICD-10-CM | POA: Diagnosis not present

## 2019-06-11 DIAGNOSIS — Z7901 Long term (current) use of anticoagulants: Secondary | ICD-10-CM | POA: Insufficient documentation

## 2019-06-11 DIAGNOSIS — E669 Obesity, unspecified: Secondary | ICD-10-CM | POA: Insufficient documentation

## 2019-06-11 DIAGNOSIS — Z6838 Body mass index (BMI) 38.0-38.9, adult: Secondary | ICD-10-CM | POA: Diagnosis not present

## 2019-06-11 DIAGNOSIS — Z79899 Other long term (current) drug therapy: Secondary | ICD-10-CM | POA: Insufficient documentation

## 2019-06-11 DIAGNOSIS — I480 Paroxysmal atrial fibrillation: Secondary | ICD-10-CM | POA: Diagnosis not present

## 2019-06-11 DIAGNOSIS — I10 Essential (primary) hypertension: Secondary | ICD-10-CM | POA: Insufficient documentation

## 2019-06-11 DIAGNOSIS — Z8579 Personal history of other malignant neoplasms of lymphoid, hematopoietic and related tissues: Secondary | ICD-10-CM | POA: Insufficient documentation

## 2019-06-11 DIAGNOSIS — Z8249 Family history of ischemic heart disease and other diseases of the circulatory system: Secondary | ICD-10-CM | POA: Insufficient documentation

## 2019-06-11 DIAGNOSIS — Z86718 Personal history of other venous thrombosis and embolism: Secondary | ICD-10-CM | POA: Diagnosis not present

## 2019-06-11 DIAGNOSIS — M1990 Unspecified osteoarthritis, unspecified site: Secondary | ICD-10-CM | POA: Diagnosis not present

## 2019-06-11 DIAGNOSIS — D6869 Other thrombophilia: Secondary | ICD-10-CM | POA: Diagnosis not present

## 2019-06-11 DIAGNOSIS — Z8672 Personal history of thrombophlebitis: Secondary | ICD-10-CM | POA: Insufficient documentation

## 2019-06-11 DIAGNOSIS — I4891 Unspecified atrial fibrillation: Secondary | ICD-10-CM | POA: Diagnosis present

## 2019-06-11 NOTE — Progress Notes (Signed)
Date:  06/11/2019   ID:  Travis [name], DOB 1950/09/16, MRN [id number]  Location: office  Provider location: [address] [place], [place] [address] Evaluation Performed: Follow up  PCP:  Lajean Manes, MD  Primary Cardiologist:  [name] Primary Electrophysiologist: Dr. Rayann Heman  CC: Afib    History of Present Illness: Travis [name] is a 69 y.o. male who presents via for nf/u today.     The pt reports that he has not had any afib recently. He did have  a couple of episodes of several hours of afib after hip surgery last fall.  He continues on flecainide 50 mg bid. No bleeding issues on eliquis 5 mg bid with a CHA2DS2VASc score of 4. He is having bilateral shoulder/wrist pain which is under evaluation.  . Today, he denies symptoms of palpitations, chest pain, shortness of breath, orthopnea, PND, lower extremity edema, claudication, dizziness, presyncope, syncope, bleeding, or neurologic sequela. The patient is tolerating medications without difficulties and is otherwise without complaint today.   he denies symptoms of cough, fevers, chills, or new SOB worrisome for COVID 19.    Atrial Fibrillation Risk Factors:  he does not have symptoms or diagnosis of sleep apnea. he is compliant with CPAP therapy. he does not have a history of rheumatic fever. he does have a history of alcohol use. The patient does not have a history of early familial atrial fibrillation or other arrhythmias.  he has a BMI of Body mass index is 38.05 kg/m.[name] Filed Weights   06/11/19 1350  Weight: 120.3 kg    Past Medical History:  Diagnosis Date  . Arthritis    "comes and goes; mostly in hands, occasionally elbow" (04/05/2017)  . Complication of anesthesia    "just wanted to sleep alot  for couple days S/P VARICOCELE OR" (04/05/2017)  . DVT (deep venous thrombosis) (HCC)    LLE  . Dysrhythmia    PVCs   . GERD (gastroesophageal reflux disease)   . History of hiatal hernia   . Hypertension     . Impaired glucose tolerance   . Myeloma ([place])   . Obesity (BMI 30-39.9) 07/26/2016  . OSA on CPAP 07/26/2016   Mild with AHI 12.5/hr now on CPAP at 14cm H2O  . Paroxysmal atrial fibrillation (HCC)   . Pneumonia    "may have had walking pneumonia a few years ago" (04/05/2017)  . Thrombophlebitis    Past Surgical History:  Procedure Laterality Date  . ATRIAL FIBRILLATION ABLATION  04/05/2017  . ATRIAL FIBRILLATION ABLATION N/A 04/05/2017   Procedure: ATRIAL FIBRILLATION ABLATION;  Surgeon: Thompson Grayer, MD;  Location: [facility];  Service: Cardiovascular;  Laterality: N/A;  . COMPLETE RIGHT HIP REPLACMENT  01/19/2019  . EVALUATION UNDER ANESTHESIA WITH HEMORRHOIDECTOMY N/A 02/24/2017   Procedure: EXAM UNDER ANESTHESIA WITH  HEMORRHOIDECTOMY;  Surgeon: Michael Boston, MD;  Location: [facility];  Service: General;  Laterality: N/A;  . FLEXIBLE SIGMOIDOSCOPY N/A 04/15/2015   Procedure:  UNSEDATED FLEXIBLE SIGMOIDOSCOPY;  Surgeon: [name], MD;  Location: [facility];  Service: Endoscopy;  Laterality: N/A;  . KNEE ARTHROSCOPY Left ~ 2016  . 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 TONSILLECTOMY    . VARICOSE VEIN SURGERY Bilateral      Current Outpatient Medications  Medication Sig Dispense Refill  . acetaminophen (TYLENOL) 500 MG tablet Take 1,000 mg by mouth at bedtime.     [name] amLODipine (NORVASC) 10 MG tablet Take  10 mg by mouth daily.     [name] b complex vitamins tablet Take 1 tablet by mouth daily.    [name] diltiazem (CARDIZEM) 30 MG tablet Take 1 Tablet Every 4 Hours As Needed For Afib HR Over 100. If using frequently please call our office [phone] 30 tablet 1  . ELIQUIS 5 MG TABS tablet TAKE ONE TABLET BY MOUTH TWICE A DAY 60 tablet 6  . flecainide (TAMBOCOR) 50 MG tablet TAKE 1 TABLET BY MOUTH TWICE DAILY 60 tablet 3  . gabapentin (NEURONTIN) 300 MG capsule TAKE TWO CAPSULES BY MOUTH TWICE A DAY (Patient taking differently: One in the morning two at night) 90 capsule 2   . hydrochlorothiazide (MICROZIDE) 12.5 MG capsule Take 12.5 mg by mouth daily.     [name] lisinopril (ZESTRIL) 30 MG tablet Take 30 mg by mouth daily.     . Melatonin 10 MG TABS Take 2 tablets by mouth at bedtime.    . metoprolol succinate (TOPROL-XL) 100 MG 24 hr tablet Take 100 mg by mouth daily.     . Multiple Vitamins-Minerals (MULTIVITAMIN PO) Take 1 tablet by mouth daily.     . psyllium (METAMUCIL) 58.6 % powder Take 1 packet by mouth every evening.     . sodium chloride (OCEAN) 0.65 % SOLN nasal spray Place 1 spray into both nostrils as needed for congestion.    . traMADol (ULTRAM) 50 MG tablet Take 50 mg by mouth at bedtime.     . triamcinolone cream (KENALOG) 0.1 % Apply topically at bedtime.     . Vitamin D, Ergocalciferol, (DRISDOL) 1.25 MG (50000 UT) CAPS capsule TAKE ONE CAPSULE BY MOUTH ONCE WEEKLY 4 capsule 3   No current facility-administered medications for this encounter.    Allergies:   Patient has no known allergies.   Social History:  The patient  reports that he has never smoked. He has never used smokeless tobacco. He reports current alcohol use of about 14.0 standard drinks of alcohol per week. He reports that he does not use drugs.   Family History:  The patient's  family history includes Atrial fibrillation in his father; Dementia in his maternal grandmother and mother; Hypertension in his father and paternal grandfather; Stroke in his father.    ROS:  Please see the history of present illness.   All other systems are personally reviewed and negative.   Exam: Well appearing, alert and conversant, regular work of breathing,  good skin color  Recent Labs: 03/21/2019: ALT 20; BUN 11; Creatinine 0.87; Hemoglobin 14.9; Platelet Count 269; Potassium 3.7; Sodium 141  personally reviewed    Other studies personally reviewed: Epic records reviewed     ASSESSMENT AND PLAN:  1.  Paroxysmal atrial fibrillation So far low burden Continue daily toprol/  flecainide 50  mg bid  Continue eliquis 5 mg bid, no bleeding issues  This patients CHA2DS2-VASc Score and unadjusted Ischemic Stroke Rate (% per year) is equal to 4.8 % stroke rate/year from a score of 4  Above score calculated as 1 point each if present [CHF, HTN, DM, Vascular=MI/PAD/Aortic Plaque, Age if 65-74, or Male] Above score calculated as 2 points each if present [Age > 75, or Stroke/TIA/TE]  2.Htn Stable   COVID screen The patient does not have any symptoms that suggest any further testing/ screening at this time.  Social distancing reinforced today.Has received first vaccine, pending  second shot 2/10     Follow-up:   In 6 months with Dr. Rayann Heman as  per recall  Current medicines are reviewed at length with the patient today.   The patient does not have concerns regarding his medicines.  The following changes were made today:  none  Labs/ tests ordered today include: none Orders Placed This Encounter  Procedures  . EKG 12-Lead    Patient Risk:  after full review of this patients clinical status, I feel that they are at mod risk at this time.   Signed, Roderic Palau NP  06/11/2019 2:13 PM  [facility] [address] [place], [place] [address] [phone]

## 2019-06-13 ENCOUNTER — Ambulatory Visit: Payer: Medicare Other | Attending: Internal Medicine

## 2019-06-13 DIAGNOSIS — Z23 Encounter for immunization: Secondary | ICD-10-CM | POA: Insufficient documentation

## 2019-06-13 NOTE — Progress Notes (Signed)
   Covid-19 Vaccination Clinic  Name:  Travis [name]    MRN: [id number] DOB: July 06, 1950  06/13/2019  [name] was observed post Covid-19 immunization for 15 minutes without incidence. He was provided with Vaccine Information Sheet and instruction to access the V-Safe system.   [name] was instructed to call 911 with any severe reactions post vaccine: [name] Difficulty breathing  . Swelling of your face and throat  . A fast heartbeat  . A bad rash all over your body  . Dizziness and weakness    Immunizations Administered    Name Date Dose VIS Date Route   Pfizer COVID-19 Vaccine 06/13/2019 12:38 PM 0.3 mL 04/13/2019 Intramuscular   Manufacturer: [place]   Lot: [id number]   [place]: [id number]

## 2019-06-19 ENCOUNTER — Ambulatory Visit
Admission: RE | Admit: 2019-06-19 | Discharge: 2019-06-19 | Disposition: A | Discharge status: Home / Self Care | Payer: Medicare Other | Source: Ambulatory Visit | Attending: Orthopaedic Surgery | Admitting: Orthopaedic Surgery

## 2019-06-19 ENCOUNTER — Other Ambulatory Visit: Payer: Self-pay

## 2019-06-19 DIAGNOSIS — G8929 Other chronic pain: Secondary | ICD-10-CM

## 2019-06-19 DIAGNOSIS — M25512 Pain in left shoulder: Secondary | ICD-10-CM

## 2019-06-26 ENCOUNTER — Other Ambulatory Visit: Payer: Self-pay | Admitting: Neurology

## 2019-07-19 ENCOUNTER — Encounter: Payer: Self-pay | Admitting: Internal Medicine

## 2019-07-19 ENCOUNTER — Other Ambulatory Visit: Payer: Self-pay

## 2019-07-19 ENCOUNTER — Ambulatory Visit (INDEPENDENT_AMBULATORY_CARE_PROVIDER_SITE_OTHER): Payer: Medicare Other | Admitting: Internal Medicine

## 2019-07-19 VITALS — BP 158/74 | HR 65 | Ht 70.0 in | Wt 264.0 lb

## 2019-07-19 DIAGNOSIS — I10 Essential (primary) hypertension: Secondary | ICD-10-CM | POA: Diagnosis not present

## 2019-07-19 NOTE — Progress Notes (Signed)
Cardiology Office Note   Date:  07/19/2019   ID:  Travis [name], Travis [name] 09/29/1950, MRN [id number]  PCP:  Lajean Manes, MD  Cardiologist:   [name], MD    F/U of PAF and HTN   History of Present Illness: Travis [name] is a 69 y.o. male with a history of PAF (CHADSVASc score 4).  He was last seen in Afib cilnic in Feb 2021.  He is maintained on flecanide The pt says he has had only occasional spells of afib The pt needs reperscription for CPAP    Currently not using.     Current Meds  Medication Sig  . acetaminophen (TYLENOL) 500 MG tablet Take 1,000 mg by mouth at bedtime.   [name] amLODipine (NORVASC) 10 MG tablet Take 10 mg by mouth daily.   [name] b complex vitamins tablet Take 1 tablet by mouth daily.  [name] diltiazem (CARDIZEM) 30 MG tablet Take 1 Tablet Every 4 Hours As Needed For Afib HR Over 100. If using frequently please call our office [phone]  . ELIQUIS 5 MG TABS tablet TAKE ONE TABLET BY MOUTH TWICE A DAY  . flecainide (TAMBOCOR) 50 MG tablet TAKE 1 TABLET BY MOUTH TWICE DAILY  . gabapentin (NEURONTIN) 300 MG capsule TAKE TWO CAPSULES BY MOUTH TWICE A DAY  . hydrochlorothiazide (MICROZIDE) 12.5 MG capsule Take 12.5 mg by mouth daily.   [name] lisinopril (ZESTRIL) 30 MG tablet Take 30 mg by mouth daily.   . Melatonin 10 MG TABS Take 2 tablets by mouth at bedtime.  . metoprolol succinate (TOPROL-XL) 100 MG 24 hr tablet Take 100 mg by mouth daily.   . Multiple Vitamins-Minerals (MULTIVITAMIN PO) Take 1 tablet by mouth daily.   . psyllium (METAMUCIL) 58.6 % powder Take 1 packet by mouth every evening.   . sodium chloride (OCEAN) 0.65 % SOLN nasal spray Place 1 spray into both nostrils as needed for congestion.  . traMADol (ULTRAM) 50 MG tablet Take 50 mg by mouth at bedtime.   . triamcinolone cream (KENALOG) 0.1 % Apply topically at bedtime.   . Vitamin D, Ergocalciferol, (DRISDOL) 1.25 MG (50000 UT) CAPS capsule TAKE ONE CAPSULE BY MOUTH ONCE WEEKLY     Allergies:    Patient has no known allergies.   Past Medical History:  Diagnosis Date  . Arthritis    "comes and goes; mostly in hands, occasionally elbow" (04/05/2017)  . Complication of anesthesia    "just wanted to sleep alot  for couple days S/P VARICOCELE OR" (04/05/2017)  . DVT (deep venous thrombosis) (HCC)    LLE  . Dysrhythmia    PVCs   . GERD (gastroesophageal reflux disease)   . History of hiatal hernia   . Hypertension   . Impaired glucose tolerance   . Myeloma ([place])   . Obesity (BMI 30-39.9) 07/26/2016  . OSA on CPAP 07/26/2016   Mild with AHI 12.5/hr now on CPAP at 14cm H2O  . Paroxysmal atrial fibrillation (HCC)   . Pneumonia    "may have had walking pneumonia a few years ago" (04/05/2017)  . Thrombophlebitis     Past Surgical History:  Procedure Laterality Date  . ATRIAL FIBRILLATION ABLATION  04/05/2017  . ATRIAL FIBRILLATION ABLATION N/A 04/05/2017   Procedure: ATRIAL FIBRILLATION ABLATION;  Surgeon: Thompson Grayer, MD;  Location: [facility];  Service: Cardiovascular;  Laterality: N/A;  . COMPLETE RIGHT HIP REPLACMENT  01/19/2019  . EVALUATION UNDER ANESTHESIA WITH HEMORRHOIDECTOMY N/A 02/24/2017   Procedure:  EXAM UNDER ANESTHESIA WITH  HEMORRHOIDECTOMY;  Surgeon: Michael Boston, MD;  [facility];  Service: General;  Laterality: N/A;  . FLEXIBLE SIGMOIDOSCOPY N/A 04/15/2015   Procedure:  UNSEDATED FLEXIBLE SIGMOIDOSCOPY;  Surgeon: [name], MD;  [facility];  Service: Endoscopy;  Laterality: N/A;  . KNEE ARTHROSCOPY Left ~ 2016  . 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 TONSILLECTOMY    . VARICOSE VEIN SURGERY Bilateral      Social History:  The patient  reports that he has never smoked. He has never used smokeless tobacco. He reports current alcohol use of about 14.0 standard drinks of alcohol per week. He reports that he does not use drugs.   Family History:  The patient's family history includes Atrial fibrillation in his father; Dementia  in his maternal grandmother and mother; Hypertension in his father and paternal grandfather; Stroke in his father.    ROS:  Please see the history of present illness. All other systems are reviewed and  Negative to the above problem except as noted.    PHYSICAL EXAM: VS:  BP (!) 158/74   Pulse 65   Ht 5\' 10"  (1.778 m)   Wt 264 lb (119.7 kg)   SpO2 96%   BMI 37.88 kg/m   GEN: Obese 68, well developed, in no acute distress  HEENT: normal  Neck: no JVD, carotid bruits Cardiac: RRR; no murmurs  No LE edema  Respiratory:  clear to auscultation bilaterally, normal work of breathing GI: soft, nontender, nondistended, + BS  No hepatomegaly  MS: no deformity Moving all extremities   Skin: warm and dry, no rash Neuro:  Strength and sensation are intact Psych: euthymic mood, full affect   EKG:  EKG is not  ordered today.   Lipid Panel No results found for: CHOL, TRIG, HDL, CHOLHDL, VLDL, LDLCALC, LDLDIRECT    Wt Readings from Last 3 Encounters:  07/19/19 264 lb (119.7 kg)  06/11/19 265 lb 3.2 oz (120.3 kg)  05/09/19 260 lb (117.9 kg)      ASSESSMENT AND PLAN:  1  PAF   Pt remains on Flecanide and Eliquis   Appears to have a low burden of recurrence that are symptomatic Keep on current regimen   CHeck BMET  2  HTN  BP has been up the past couple times in clinic   Will review labs   He is on dilt prn, HCTZ 12.5 and lisinopril 30 as well as amlodipine 10  If potassium and Cr OK,  I would recomm increasing lisinopril to 40 mg    Will be in touch with pt  3  OSA   Will refer to Sleep clinic for CPAP Rx  Current medicines are reviewed at length with the patient today.  The patient does not have concerns regarding medicines.  Signed, [name], MD  07/19/2019 12:05 PM    [facility] [address], [place], [place]  [address] Phone: [phone]; Fax: [phone]

## 2019-07-19 NOTE — Patient Instructions (Signed)
Medication Instructions:  No changes *If you need a refill on your cardiac medications before your next appointment, please call your pharmacy*   Lab Work: Today: BMET  If you have labs (blood work) drawn today and your tests are completely normal, you will receive your results only by: [name] MyChart Message (if you have MyChart) OR . A paper copy in the mail If you have any lab test that is abnormal or we need to change your treatment, we will call you to review the results.   Testing/Procedures: none   Other Instructions Follow up with Dr. Rayann Heman and Dr. Radford Pax as recommended.

## 2019-07-20 LAB — BASIC METABOLIC PANEL
BUN/Creatinine Ratio: 19 (ref 10–24)
BUN: 14 mg/dL (ref 8–27)
CO2: 23 mmol/L (ref 20–29)
Calcium: 9.1 mg/dL (ref 8.6–10.2)
Chloride: 102 mmol/L (ref 96–106)
Creatinine, Ser: 0.74 mg/dL — ABNORMAL LOW (ref 0.76–1.27)
GFR calc Af Amer: 110 mL/min/{1.73_m2} (ref >59)
GFR calc non Af Amer: 95 mL/min/{1.73_m2} (ref >59)
Glucose: 110 mg/dL — ABNORMAL HIGH (ref 65–99)
Potassium: 3.8 mmol/L (ref 3.5–5.2)
Sodium: 141 mmol/L (ref 134–144)

## 2019-07-24 NOTE — Progress Notes (Signed)
Patient Care Team: Lajean Manes, MD as PCP - General (Internal Medicine) Fay Records, MD as PCP - Cardiology (Cardiology) Sueanne Margarita, MD as PCP - Sleep Medicine (Cardiology) Thompson Grayer, MD as PCP - Electrophysiology (Cardiology) Michael Boston, MD as Consulting Physician (General Surgery) Clarene Essex, MD as Consulting Physician (Gastroenterology) Sueanne Margarita, MD as Consulting Physician (Sleep Medicine) Alda Berthold, DO as Consulting Physician (Neurology)  DIAGNOSIS:    ICD-10-CM   1. 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 with above-mentioned history of smoldering myeloma. He presents to the clinic today for follow-up.   He had hip replacement surgery and later on developed pain extending from her shoulders to the arms.  He has seen rheumatology who performed some blood work recently.  He was sent to [place] to make sure that his myeloma is still in the smoldering state and is not getting active.  He does not have any other symptoms.  He was prescribed prednisone and this has alleviated his symptoms.  ALLERGIES:  has No Known Allergies.  MEDICATIONS:  Current Outpatient Medications  Medication Sig Dispense Refill  . acetaminophen (TYLENOL) 500 MG tablet Take 1,000 mg by mouth at bedtime.     [name] amLODipine (NORVASC) 10 MG tablet Take 10 mg by mouth daily.     [name] b complex vitamins tablet Take 1 tablet by mouth daily.    [name] diltiazem (CARDIZEM) 30 MG tablet Take 1 Tablet Every 4  Hours As Needed For Afib HR Over 100. If using frequently please call our office [phone] 30 tablet 1  . ELIQUIS 5 MG TABS tablet TAKE ONE TABLET BY MOUTH TWICE A DAY 60 tablet 6  . flecainide (TAMBOCOR) 50 MG tablet TAKE 1 TABLET BY MOUTH TWICE DAILY 60 tablet 3  . gabapentin (NEURONTIN) 300 MG capsule TAKE TWO CAPSULES BY MOUTH TWICE A DAY 90 capsule 1  . hydrochlorothiazide (MICROZIDE) 12.5 MG capsule Take 12.5 mg by mouth daily.     [name] lisinopril (ZESTRIL) 30 MG tablet Take 30 mg by mouth daily.     . Melatonin 10 MG TABS Take 2 tablets by mouth at bedtime.    . methocarbamol (ROBAXIN) 500 MG tablet Take 500 mg by mouth every 8 (eight) hours as needed for spasms.    . metoprolol succinate (TOPROL-XL) 100 MG 24 hr tablet Take 100 mg by mouth daily.     . Multiple Vitamins-Minerals (MULTIVITAMIN PO) Take 1 tablet by mouth daily.     . psyllium (METAMUCIL) 58.6 % powder Take 1 packet by mouth every evening.     . sodium chloride (OCEAN) 0.65 % SOLN nasal spray Place 1 spray into both nostrils as needed for congestion.    . traMADol (ULTRAM) 50 MG tablet Take 50 mg by mouth at bedtime.     . triamcinolone cream (KENALOG) 0.1 % Apply topically at bedtime.     . Vitamin D, Ergocalciferol, (DRISDOL) 1.25 MG (50000 UT) CAPS  capsule TAKE ONE CAPSULE BY MOUTH ONCE WEEKLY 4 capsule 3   No current facility-administered medications for this visit.    PHYSICAL EXAMINATION: ECOG PERFORMANCE STATUS: 1 - Symptomatic but completely ambulatory  There were no vitals filed for this visit. There were no vitals filed for this visit.  LABORATORY DATA:  I have reviewed the data as listed CMP Latest Ref Rng & Units 07/19/2019 03/21/2019 07/10/2018  Glucose 65 - 99 mg/dL 110(H) 125(H) 95  BUN 8 - 27 mg/dL '14 11 20  ' Creatinine 0.76 - 1.27 mg/dL 0.74(L) 0.87 0.83  Sodium 134 - 144 mmol/L 141 141 138  Potassium 3.5 - 5.2 mmol/L 3.8 3.7 3.7  Chloride 96 - 106 mmol/L 102 103 104  CO2 20 - 29 mmol/L '23 26 25    ' Calcium 8.6 - 10.2 mg/dL 9.1 9.2 9.0  Total Protein 6.5 - 8.1 g/dL - 7.0 -  Total Bilirubin 0.3 - 1.2 mg/dL - 0.3 -  Alkaline Phos 38 - 126 U/L - 122 -  AST 15 - 41 U/L - 16 -  ALT 0 - 44 U/L - 20 -    Lab Results  Component Value Date   WBC 12.4 (H) 07/25/2019   HGB 14.7 07/25/2019   HCT 43.8 07/25/2019   MCV 92.6 07/25/2019   PLT 291 07/25/2019   NEUTROABS 10.7 (H) 07/25/2019    ASSESSMENT & PLAN:  Smoldering multiple myeloma ([place]) Lab review 03/21/2019:Kappa 268 (was 112: 9 months ago), lambda 10.3, ratio 26.07 (it was 15.18), M protein 0.4 g (was 0.6 g) Hemoglobin 14.9, calcium 9.2, creatinine 0.87  Review: I discussed with the patient that Kappa is certainly increasing however the M protein has come down.  No clinical signs of progression of disease Patient is being followed at [facility] by Dr. Baltazar Najjar.  Severe joint discomfort: Patient is a rheumatology who sent the patient back to [place] to discuss if the myeloma is progressing.  If it is not progressing then they plan to treat him with medications for his joint pains.  If it is progressing then we will have to consider doing a bone marrow biopsy and consider treatment options.  I will call him with the results of the test from today.   Return to clinic in 1 year for follow-up.  He will move his [facility] appointment to 6 months from now.    No orders of the defined types were placed in this encounter.  The patient has a good understanding of the overall plan. he agrees with it. he will call with any problems that may develop before the next visit here.  Total time spent: 20 mins including face to face time and time spent for planning, charting and coordination of care  Nicholas Lose, MD 07/25/2019  I, Cloyde Reams Dorshimer, am acting as scribe for Dr. Nicholas Lose.  I have reviewed the above documentation for accuracy and completeness, and I agree with the above.

## 2019-07-25 ENCOUNTER — Other Ambulatory Visit: Payer: Self-pay

## 2019-07-25 ENCOUNTER — Inpatient Hospital Stay: Payer: Medicare Other | Attending: Hematology and Oncology | Admitting: Hematology and Oncology

## 2019-07-25 ENCOUNTER — Inpatient Hospital Stay: Payer: Medicare Other

## 2019-07-25 ENCOUNTER — Telehealth: Payer: Self-pay | Admitting: Internal Medicine

## 2019-07-25 DIAGNOSIS — C9000 Multiple myeloma not having achieved remission: Secondary | ICD-10-CM | POA: Diagnosis present

## 2019-07-25 DIAGNOSIS — D472 Monoclonal gammopathy: Secondary | ICD-10-CM

## 2019-07-25 DIAGNOSIS — Z7901 Long term (current) use of anticoagulants: Secondary | ICD-10-CM | POA: Diagnosis not present

## 2019-07-25 DIAGNOSIS — Z79899 Other long term (current) drug therapy: Secondary | ICD-10-CM | POA: Diagnosis not present

## 2019-07-25 DIAGNOSIS — M25519 Pain in unspecified shoulder: Secondary | ICD-10-CM | POA: Insufficient documentation

## 2019-07-25 LAB — CBC WITH DIFFERENTIAL (CANCER CENTER ONLY)
Abs Immature Granulocytes: 0.03 10*3/uL (ref 0.00–0.07)
Basophils Absolute: 0 10*3/uL (ref 0.0–0.1)
Basophils Relative: 0 %
Eosinophils Absolute: 0 10*3/uL (ref 0.0–0.5)
Eosinophils Relative: 0 %
HCT: 43.8 % (ref 39.0–52.0)
Hemoglobin: 14.7 g/dL (ref 13.0–17.0)
Immature Granulocytes: 0 %
Lymphocytes Relative: 9 %
Lymphs Abs: 1.1 10*3/uL (ref 0.7–4.0)
MCH: 31.1 pg (ref 26.0–34.0)
MCHC: 33.6 g/dL (ref 30.0–36.0)
MCV: 92.6 fL (ref 80.0–100.0)
Monocytes Absolute: 0.6 10*3/uL (ref 0.1–1.0)
Monocytes Relative: 5 %
Neutro Abs: 10.7 10*3/uL — ABNORMAL HIGH (ref 1.7–7.7)
Neutrophils Relative %: 86 %
Platelet Count: 291 10*3/uL (ref 150–400)
RBC: 4.73 MIL/uL (ref 4.22–5.81)
RDW: 13.5 % (ref 11.5–15.5)
WBC Count: 12.4 10*3/uL — ABNORMAL HIGH (ref 4.0–10.5)
nRBC: 0 % (ref 0.0–0.2)

## 2019-07-25 LAB — CMP (CANCER CENTER ONLY)
ALT: 33 U/L (ref 0–44)
AST: 19 U/L (ref 15–41)
Albumin: 3.8 g/dL (ref 3.5–5.0)
Alkaline Phosphatase: 96 U/L (ref 38–126)
Anion gap: 11 (ref 5–15)
BUN: 13 mg/dL (ref 8–23)
CO2: 27 mmol/L (ref 22–32)
Calcium: 9.6 mg/dL (ref 8.9–10.3)
Chloride: 101 mmol/L (ref 98–111)
Creatinine: 0.85 mg/dL (ref 0.61–1.24)
GFR, Est AFR Am: >60 mL/min (ref >60)
GFR, Estimated: >60 mL/min (ref >60)
Glucose, Bld: 139 mg/dL — ABNORMAL HIGH (ref 70–99)
Potassium: 3.9 mmol/L (ref 3.5–5.1)
Sodium: 139 mmol/L (ref 135–145)
Total Bilirubin: 0.5 mg/dL (ref 0.3–1.2)
Total Protein: 6.9 g/dL (ref 6.5–8.1)

## 2019-07-25 LAB — LACTATE DEHYDROGENASE: LDH: 168 U/L (ref 98–192)

## 2019-07-25 NOTE — Assessment & Plan Note (Signed)
Lab review 03/21/2019:Kappa 268 (was 112: 9 months ago), lambda 10.3, ratio 26.07 (it was 15.18), M protein 0.4 g (was 0.6 g) Hemoglobin 14.9, calcium 9.2, creatinine 0.87  Review: I discussed with the patient that Kappa is certainly increasing however the M protein has come down.  No clinical signs of progression of disease Patient is being followed at [facility] by Travis [name].  Travis [name] is going to see her back to [facility] in July for follow-up.  Back issues: Related to arthritis Return to clinic in 1 year for follow-up

## 2019-07-25 NOTE — Telephone Encounter (Signed)
Review of labs    Would recomm increasing lisinopril to 40 mg if he is taking 30 mg   For tighter control of BP

## 2019-07-26 LAB — BETA 2 MICROGLOBULIN, SERUM: Beta-2 Microglobulin: 1.6 mg/L (ref 0.6–2.4)

## 2019-07-26 LAB — KAPPA/LAMBDA LIGHT CHAINS
Kappa free light chain: 303.7 mg/L — ABNORMAL HIGH (ref 3.3–19.4)
Kappa, lambda light chain ratio: 37.04 — ABNORMAL HIGH (ref 0.26–1.65)
Lambda free light chains: 8.2 mg/L (ref 5.7–26.3)

## 2019-07-26 MED ORDER — LISINOPRIL 40 MG PO TABS: 40.0000 mg | ORAL_TABLET | Freq: Every day | ORAL | 3 refills | Status: DC

## 2019-07-26 NOTE — Addendum Note (Signed)
Addended by: Rodman Key on: 07/26/2019 05:33 PM   Modules accepted: Orders

## 2019-07-26 NOTE — Telephone Encounter (Signed)
Patient is taking lisinopril 30 and agrees to increase to 40 mg daily.

## 2019-07-31 LAB — MULTIPLE MYELOMA PANEL, SERUM
Albumin SerPl Elph-Mcnc: 3.6 g/dL (ref 2.9–4.4)
Albumin/Glob SerPl: 1.3 (ref 0.7–1.7)
Alpha 1: 0.3 g/dL (ref 0.0–0.4)
Alpha2 Glob SerPl Elph-Mcnc: 0.8 g/dL (ref 0.4–1.0)
B-Globulin SerPl Elph-Mcnc: 1.4 g/dL — ABNORMAL HIGH (ref 0.7–1.3)
Gamma Glob SerPl Elph-Mcnc: 0.4 g/dL (ref 0.4–1.8)
Globulin, Total: 2.8 g/dL (ref 2.2–3.9)
IgA: 488 mg/dL — ABNORMAL HIGH (ref 61–437)
IgG (Immunoglobin G), Serum: 547 mg/dL — ABNORMAL LOW (ref 603–1613)
IgM (Immunoglobulin M), Srm: 32 mg/dL (ref 20–172)
M Protein SerPl Elph-Mcnc: 0.3 g/dL — ABNORMAL HIGH
Total Protein ELP: 6.4 g/dL (ref 6.0–8.5)

## 2019-08-06 ENCOUNTER — Other Ambulatory Visit: Payer: Self-pay | Admitting: Neurology

## 2019-08-26 ENCOUNTER — Other Ambulatory Visit (HOSPITAL_COMMUNITY): Payer: Self-pay | Admitting: Nurse Practitioner

## 2019-08-31 ENCOUNTER — Telehealth: Payer: Self-pay | Admitting: *Deleted

## 2019-08-31 NOTE — Telephone Encounter (Signed)
Reached out to patient who states he needs a new cpap machine but his old machine is not 69 years old. Patient states [employer] told him that if something was wrong with his machine he could get a new one. He is scheduled for a televisit on 09/14/19 with dr Radford Pax she will order him another unit at that visit. Pt is aware and agreeable to treatment.

## 2019-08-31 NOTE — Telephone Encounter (Signed)
-----   Message from Rodman Key, RN sent at 08/30/2019  3:45 PM EDT ----- Regarding: sleep follow up Hi! This is a sleep pt who sees [name] for cardiology. [employer] is sending orders to [name] to sign for his sleep supplies.  We saw him in March and [name] signed his form for supplies until he gets in w Dr. Radford Pax. Can you re order his supplies and see if he needs a sleep follow up?  Thank you so much, Michalene

## 2019-09-11 ENCOUNTER — Other Ambulatory Visit (HOSPITAL_COMMUNITY): Payer: Self-pay | Admitting: Nurse Practitioner

## 2019-09-11 ENCOUNTER — Other Ambulatory Visit: Payer: Self-pay | Admitting: Neurology

## 2019-09-13 ENCOUNTER — Telehealth: Payer: Self-pay | Admitting: Internal Medicine

## 2019-09-13 NOTE — Telephone Encounter (Signed)
[facility] called and wanted to discuss the rx for a cpap and home cpap supplies. They are unable to approve the rx because there is no pressure setting. If [name] does not have a specific recommendation she can just list the default of 4-20. The company will be faxing another form for [name] to fill out

## 2019-09-13 NOTE — Progress Notes (Addendum)
Virtual Visit via Video Note   This visit type was conducted due to national recommendations for restrictions regarding the COVID-19 Pandemic (e.g. social distancing) in an effort to limit this patient's exposure and mitigate transmission in our community.  Due to his co-morbid illnesses, this patient is at least at moderate risk for complications without adequate follow up.  This format is felt to be most appropriate for this patient at this time.  All issues noted in this document were discussed and addressed.  A limited physical exam was performed with this format.  Please refer to the patient's chart for his consent to telehealth for [facility].   Evaluation Performed:  Follow-up visit  This visit type was conducted due to national recommendations for restrictions regarding the COVID-19 Pandemic (e.g. social distancing).  This format is felt to be most appropriate for this patient at this time.  All issues noted in this document were discussed and addressed.  No physical exam was performed (except for noted visual exam findings with Video Visits).  Please refer to the patient's chart (MyChart message for video visits and phone note for telephone visits) for the patient's consent to telehealth for [facility].  Date:  09/14/2019   ID:  Travis [name], DOB [birth year], MRN [id number]  Patient Location:  Home  Provider location:   [place]  PCP:  Lajean Manes, MD  Cardiologist:  [name], MD  Sleep Medicine:  Fransico Him, MD Electrophysiologist:  Thompson Grayer, MD   Chief Complaint:  OSA  History of Present Illness:    Travis [name] is a 69 y.o. male who presents via audio/video conferencing for a telehealth visit today.    Travis [name] is a 69 y.o. male with a hx of persistent atrial fibrillation, HTN and OSA.He was referred for repeat sleep study by Dr. Marguerita Beards to snoring and awakening with SOB. His PSG showed mild OSA with an AHi of 12.5/hr with oxygen  desaturations to 85% with nocturnal hypoxemia. He subsequently underwent CPAP titration to 14cm H2O.  He is doing well with his CPAP device and thinks that he has gotten used to it.  He tolerates the mask and feels the pressure is adequate.  He has had some problems intermittently with the device whistleling a lot keeping him awake.  This was coming from where the reservoir compartment.  He noticed this when he moved to a different bedroom to recover from hip surgery but is now back in his normal bedroom and the noise is gone.  Since his hip surgery he has been getting up more at night and therefore sometimes he feels tired in the am.   He occasionally will take a nap during the day for about 20 min.  He has a fair amount of mouth and nasal dryness.  His wife says that sometimes he snores when he is sleeping but has been sleeping supine since his surgery.     The patient does not have symptoms concerning for COVID-19 infection (fever, chills, cough, or new shortness of breath).   Prior CV studies:   The following studies were reviewed today:  PAP compliance download  Past Medical History:  Diagnosis Date  . Arthritis    "comes and goes; mostly in hands, occasionally elbow" (04/05/2017)  . Complication of anesthesia    "just wanted to sleep alot  for couple days S/P VARICOCELE OR" (04/05/2017)  . DVT (deep venous thrombosis) (HCC)    LLE  . Dysrhythmia  PVCs   . GERD (gastroesophageal reflux disease)   . History of hiatal hernia   . Hypertension   . Impaired glucose tolerance   . Myeloma ([place])   . Obesity (BMI 30-39.9) 07/26/2016  . OSA on CPAP 07/26/2016   Mild with AHI 12.5/hr now on CPAP at 14cm H2O  . Paroxysmal atrial fibrillation (HCC)   . Pneumonia    "may have had walking pneumonia a few years ago" (04/05/2017)  . Thrombophlebitis    Past Surgical History:  Procedure Laterality Date  . ATRIAL FIBRILLATION ABLATION  04/05/2017  . ATRIAL FIBRILLATION ABLATION N/A 04/05/2017    Procedure: ATRIAL FIBRILLATION ABLATION;  Surgeon: Thompson Grayer, MD;  Location: [facility];  Service: Cardiovascular;  Laterality: N/A;  . COMPLETE RIGHT HIP REPLACMENT  01/19/2019  . EVALUATION UNDER ANESTHESIA WITH HEMORRHOIDECTOMY N/A 02/24/2017   Procedure: EXAM UNDER ANESTHESIA WITH  HEMORRHOIDECTOMY;  Surgeon: Michael Boston, MD;  Location: [facility];  Service: General;  Laterality: N/A;  . FLEXIBLE SIGMOIDOSCOPY N/A 04/15/2015   Procedure:  UNSEDATED FLEXIBLE SIGMOIDOSCOPY;  Surgeon: [name], MD;  Location: [facility];  Service: Endoscopy;  Laterality: N/A;  . KNEE ARTHROSCOPY Left ~ 2016  . 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 TONSILLECTOMY    . VARICOSE VEIN SURGERY Bilateral      Current Meds  Medication Sig  . acetaminophen (TYLENOL 8 HOUR ARTHRITIS PAIN) 650 MG CR tablet Take 1,300 mg by mouth daily.  [name] amLODipine (NORVASC) 10 MG tablet Take 10 mg by mouth daily.   [name] diltiazem (CARDIZEM) 30 MG tablet Take 1 Tablet Every 4 Hours As Needed For Afib HR Over 100. If using frequently please call our office [phone]  . ELIQUIS 5 MG TABS tablet TAKE ONE TABLET BY MOUTH TWICE A DAY  . flecainide (TAMBOCOR) 50 MG tablet TAKE 1 TABLET BY MOUTH TWICE DAILY  . gabapentin (NEURONTIN) 300 MG capsule TAKE TWO CAPSULES BY MOUTH TWICE A DAY (Patient taking differently: TAKE TWO CAPSULES BY MOUTH TWICE A DAY)  . hydrochlorothiazide (MICROZIDE) 12.5 MG capsule Take 12.5 mg by mouth daily.   [name] lisinopril (ZESTRIL) 40 MG tablet Take 1 tablet (40 mg total) by mouth daily.  . Melatonin 10 MG TABS Take 2 tablets by mouth at bedtime.  . methotrexate 2.5 MG tablet Take 15 mg by mouth once a week.  . metoprolol succinate (TOPROL-XL) 100 MG 24 hr tablet Take 100 mg by mouth daily.   . Multiple Vitamins-Minerals (MULTIVITAMIN PO) Take 1 tablet by mouth daily.   . predniSONE (DELTASONE) 5 MG tablet Take 5 mg by mouth daily.  . psyllium (METAMUCIL) 58.6 % powder Take 1 packet by mouth  every evening.   . sodium chloride (OCEAN) 0.65 % SOLN nasal spray Place 1 spray into both nostrils as needed for congestion.  . triamcinolone cream (KENALOG) 0.1 % Apply topically at bedtime.   . Vitamin D, Ergocalciferol, (DRISDOL) 1.25 MG (50000 UT) CAPS capsule TAKE ONE CAPSULE BY MOUTH ONCE WEEKLY     Allergies:   Patient has no known allergies.   Social History   Tobacco Use  . Smoking status: Never Smoker  . Smokeless tobacco: Never Used  Substance Use Topics  . Alcohol use: Yes    Alcohol/week: 14.0 standard drinks    Types: 14 Glasses of wine per week    Comment: daily; a couple of glasses of wine every night   . Drug use: No     Family  Hx: The patient's family history includes Atrial fibrillation in his father; Dementia in his maternal grandmother and mother; Hypertension in his father and paternal grandfather; Stroke in his father.  ROS:   Please see the history of present illness.     All other systems reviewed and are negative.   Labs/Other Tests and Data Reviewed:    Recent Labs: 07/25/2019: ALT 33; BUN 13; Creatinine 0.85; Hemoglobin 14.7; Platelet Count 291; Potassium 3.9; Sodium 139   Recent Lipid Panel No results found for: CHOL, TRIG, HDL, CHOLHDL, LDLCALC, LDLDIRECT  Wt Readings from Last 3 Encounters:  09/14/19 270 lb (122.5 kg)  07/25/19 263 lb 1.6 oz (119.3 kg)  07/19/19 264 lb (119.7 kg)     Objective:    Vital Signs:  BP 132/78   Pulse (!) 59   Wt 270 lb (122.5 kg)   BMI 38.74 kg/m    CONSTITUTIONAL:  Well nourished, well developed male in no acute distress.  EYES: anicteric MOUTH: oral mucosa is pink RESPIRATORY: Normal respiratory effort, symmetric expansion CARDIOVASCULAR: No peripheral edema SKIN: No rash, lesions or ulcers MUSCULOSKELETAL: no digital cyanosis NEURO: Cranial Nerves II-XII grossly intact, moves all extremities PSYCH: Intact judgement and insight.  A&O x 3, Mood/affect appropriate   ASSESSMENT & PLAN:    1.   OSA -The patient is tolerating PAP therapy well without any problems. The PAP download was reviewed today and showed an AHI of 3.1/hr on 12.6 cm H2O with 100% compliance in using more than 4 hours nightly.  The patient has been using and benefiting from PAP use and will continue to benefit from therapy.  -I will order him a new machine since his device is making a lot of noises that will not resolve  2.  HTN -BP controlled -continue amlodipine 10mg  daily, HCZ 12.5mg  daily, Lisinopril 40mg  daily, Toprol XL 100mg  daily  3.  Obesity -I have encouraged him to get into a routine exercise program and cut back on carbs and portions. His exercise currently is limited due to a recent hip injury with a pulled muscle.   Patient Risk:   After full review of this patient's clinical status, I feel that they are at least moderate risk at this time.  Time:   Today, I have spent 20 minutes on telemedicine discussing medical problems including OSA, HTN, obesity and reviewing patient's chart including PAP compliance download.  Medication Adjustments/Labs and Tests Ordered: Current medicines are reviewed at length with the patient today.  Concerns regarding medicines are outlined above.  Tests Ordered: No orders of the defined types were placed in this encounter.  Medication Changes: No orders of the defined types were placed in this encounter.   Disposition:  Follow up in 1 year(s)  Signed, Fransico Him, MD  09/14/2019 8:50 AM    [facility]

## 2019-09-13 NOTE — Telephone Encounter (Addendum)
He is scheduled for a televisit on 09/14/19 with dr Radford Pax she will order him another unit at that visit. [employer] will fax the RX to the sleep provider Dr Radford Pax not Dr Harrington Challenger. Pt is aware and agreeable to treatment.

## 2019-09-14 ENCOUNTER — Other Ambulatory Visit: Payer: Self-pay

## 2019-09-14 ENCOUNTER — Encounter: Payer: Self-pay | Admitting: Cardiology

## 2019-09-14 ENCOUNTER — Telehealth (INDEPENDENT_AMBULATORY_CARE_PROVIDER_SITE_OTHER): Payer: Medicare Other | Admitting: Cardiology

## 2019-09-14 VITALS — BP 132/78 | HR 59 | Wt 270.0 lb

## 2019-09-14 DIAGNOSIS — E669 Obesity, unspecified: Secondary | ICD-10-CM

## 2019-09-14 DIAGNOSIS — I10 Essential (primary) hypertension: Secondary | ICD-10-CM

## 2019-09-14 DIAGNOSIS — G4733 Obstructive sleep apnea (adult) (pediatric): Secondary | ICD-10-CM

## 2019-09-14 NOTE — Patient Instructions (Signed)
Medication Instructions:  No changes *If you need a refill on your cardiac medications before your next appointment, please call your pharmacy*   Lab Work: none If you have labs (blood work) drawn today and your tests are completely normal, you will receive your results only by: [name] MyChart Message (if you have MyChart) OR . A paper copy in the mail If you have any lab test that is abnormal or we need to change your treatment, we will call you to review the results.   Testing/Procedures: None  Follow-Up: At [facility], you and your health needs are our priority.  As part of our continuing mission to provide you with exceptional heart care, we have created designated Provider Care Teams.  These Care Teams include your primary Cardiologist (physician) and Advanced Practice Providers (APPs -  Physician Assistants and Nurse Practitioners) who all work together to provide you with the care you need, when you need it.  Your next appointment:   12 month(s)  The format for your next appointment:   Either In Person or Virtual  Provider:   Fransico Him, MD   Other Instructions

## 2019-09-18 ENCOUNTER — Telehealth: Payer: Self-pay | Admitting: *Deleted

## 2019-09-18 NOTE — Telephone Encounter (Signed)
[employer] notified via community message.

## 2019-09-18 NOTE — Telephone Encounter (Signed)
-----   Message from Sueanne Margarita, MD sent at 09/14/2019  8:52 AM EDT ----- Please let his DME know that his device has stopped making the noise so for now we are going to hold off on a new machine

## 2019-11-01 ENCOUNTER — Other Ambulatory Visit: Payer: Self-pay | Admitting: Neurology

## 2019-11-14 ENCOUNTER — Ambulatory Visit: Payer: Medicare Other | Admitting: Physician Assistant

## 2019-11-14 ENCOUNTER — Other Ambulatory Visit: Payer: Self-pay

## 2019-11-14 ENCOUNTER — Encounter: Payer: Self-pay | Admitting: Physician Assistant

## 2019-11-14 VITALS — BP 140/58 | HR 59 | Ht 70.0 in | Wt 272.4 lb

## 2019-11-14 DIAGNOSIS — C9000 Multiple myeloma not having achieved remission: Secondary | ICD-10-CM

## 2019-11-14 DIAGNOSIS — I480 Paroxysmal atrial fibrillation: Secondary | ICD-10-CM

## 2019-11-14 DIAGNOSIS — I10 Essential (primary) hypertension: Secondary | ICD-10-CM

## 2019-11-14 DIAGNOSIS — R0602 Shortness of breath: Secondary | ICD-10-CM | POA: Diagnosis not present

## 2019-11-14 DIAGNOSIS — M7989 Other specified soft tissue disorders: Secondary | ICD-10-CM | POA: Diagnosis not present

## 2019-11-14 DIAGNOSIS — D472 Monoclonal gammopathy: Secondary | ICD-10-CM

## 2019-11-14 MED ORDER — HYDROCHLOROTHIAZIDE 25 MG PO TABS
25.0000 mg | ORAL_TABLET | Freq: Every day | ORAL | 2 refills | Status: DC
Start: 2019-11-14 — End: 2020-08-18

## 2019-11-14 NOTE — Patient Instructions (Signed)
Medication Instructions:  Your physician has recommended you make the following change in your medication:   1) Increase HCTZ to 25 mg, 1 tablet by mouth once a day  *If you need a refill on your cardiac medications before your next appointment, please call your pharmacy*  Lab Work: Your physician recommends that you return for lab work in 1 week for BMET  If you have labs (blood work) drawn today and your tests are completely normal, you will receive your results only by: [name] MyChart Message (if you have MyChart) OR . A paper copy in the mail If you have any lab test that is abnormal or we need to change your treatment, we will call you to review the results.  Testing/Procedures: Your physician has requested that you have an echocardiogram. Echocardiography is a painless test that uses sound waves to create images of your heart. It provides your doctor with information about the size and shape of your heart and how well your heart's chambers and valves are working. This procedure takes approximately one hour. There are no restrictions for this procedure.  Follow-Up: At [facility], you and your health needs are our priority.  As part of our continuing mission to provide you with exceptional heart care, we have created designated Provider Care Teams.  These Care Teams include your primary Cardiologist (physician) and Advanced Practice Providers (APPs -  Physician Assistants and Nurse Practitioners) who all work together to provide you with the care you need, when you need it.  We recommend signing up for the patient portal called "MyChart".  Sign up information is provided on this After Visit Summary.  MyChart is used to connect with patients for Virtual Visits (Telemedicine).  Patients are able to view lab/test results, encounter notes, upcoming appointments, etc.  Non-urgent messages can be sent to your provider as well.   To learn more about what you can do with MyChart, go to  [email or web address].    Your next appointment:   6 month(s)  The format for your next appointment:   In Person  Provider:   [name], MD   Other Instructions Wear compression stockings

## 2019-11-14 NOTE — Progress Notes (Signed)
Cardiology Office Note:    Date:  11/14/2019   ID:  Travis [name], DOB 01-04-1951, MRN [id number]  PCP:  Lajean Manes, MD  Cardiologist:  [name], MD   Electrophysiologist:  Thompson Grayer, MD  Sleep Medicine:  Fransico Him, MD    Referring MD: Lajean Manes, MD   Chief Complaint:  Leg Swelling    Patient Profile:    Travis [name] is a 69 y.o. male with:   Paroxysmal atrial fibrillation   S/p PVI ablation in 12/18  CHA2DS2-VASc=2 (age, HTN) >> Apixaban    Flecainide Rx  Cardiac CTA 03/2017: Ca2+ score 0, no evidence of CAD  OSA   Hypertension   Echocardiogram 3/19: EF 60-65  Peripheral neuropathy  Prior DVT   Monoclonal gammopathy (smoldering myeloma)  Prior CV studies: Long-term monitor 01/26/2018 Predominant rhythm is sinus rhythm Rare premature atrial contractions and premature ventricular contractions Nonsustained atrial tachycardia is observed No sustained arrhythmias  Echocardiogram 07/29/2017 GLS -17%, mild concentric LVH, EF 60-65, normal wall motion, normal diastolic function, ascending aorta 38 mm, aortic root 40 mm  Cardiac CTA 03/31/2017 IMPRESSION: 1. There is normal pulmonary vein drainage into the left atrium.  2. The left atrial appendage is large with chicken wing morphology and one major lobe. Ostial size 27 x 21 mm and length 42 mm. There is no thrombus in the left atrial appendage.  3. The esophagus runs to the left from the left atrial midline and is in the proximity to the LLPV.  4. Normal coronary origin. Right dominance. Calcium score 0. No evidence of CAD.   Myoview 02/18/2016 Normal resting and stress perfusion. No ischemia or infarction EF 60%  Echocardiogram 11/20/2015 Mild LVH, EF 60-65  History of Present Illness:    Travis [name] was last seen by [name] in 07/2019.     He contacted [name] via [place] recently due to L ankle swelling.  His BNP was elevated but he was not having shortness of  breath.  His routine appt with me for 12/12/2019 was moved up to today for evaluation.   Labs from [facility] 11/02/19: NT-Pro BNP 325 Mg 2.3, K 3.4, BUN 11, Cr 0.7, ALT 16, Alb 4.1   He is here alone today.  Over the past several months, he has felt bilateral lower extremity swelling with the left being greater than the right.  He has neuropathy and he also has an old injury in his left ankle.  He has some shortness of breath with certain activities but no significant worsening.  He has not had chest pain, orthopnea or syncope.  He uses CPAP at night.  He has occasional episodes of atrial fibrillation.  He sometimes has prompt relief with taking diltiazem.  He had one recent episode after being outside for prolonged period of time.  He had another episode after having an alcoholic drink.  Past Medical History:  Diagnosis Date  . Arthritis    "comes and goes; mostly in hands, occasionally elbow" (04/05/2017)  . Complication of anesthesia    "just wanted to sleep alot  for couple days S/P VARICOCELE OR" (04/05/2017)  . DVT (deep venous thrombosis) (HCC)    LLE  . Dysrhythmia    PVCs   . GERD (gastroesophageal reflux disease)   . History of hiatal hernia   . Hypertension   . Impaired glucose tolerance   . Myeloma ([place])   . Obesity (BMI 30-39.9) 07/26/2016  . OSA on CPAP 07/26/2016   Mild  with AHI 12.5/hr now on CPAP at 14cm H2O  . Paroxysmal atrial fibrillation (HCC)   . Pneumonia    "may have had walking pneumonia a few years ago" (04/05/2017)  . Thrombophlebitis     Current Medications: Current Meds  Medication Sig  . acetaminophen (TYLENOL 8 HOUR ARTHRITIS PAIN) 650 MG CR tablet Take 1,300 mg by mouth daily.  [name] amLODipine (NORVASC) 10 MG tablet Take 10 mg by mouth daily.   [name] b complex vitamins tablet Take 1 tablet by mouth daily.  . cyanocobalamin (,VITAMIN B-12,) 1000 MCG/ML injection 1000 mcg into the muscle monthly. Please give box of 3 ml syringes and box of 1/2" 21g needles    . diltiazem (CARDIZEM) 30 MG tablet Take 1 Tablet Every 4 Hours As Needed For Afib HR Over 100. If using frequently please call our office [phone]  . ELIQUIS 5 MG TABS tablet TAKE ONE TABLET BY MOUTH TWICE A DAY  . flecainide (TAMBOCOR) 50 MG tablet TAKE 1 TABLET BY MOUTH TWICE DAILY  . folic acid (FOLVITE) 1 MG tablet Take 1 mg by mouth daily.  [name] gabapentin (NEURONTIN) 300 MG capsule TAKE TWO CAPSULES BY MOUTH TWICE A DAY  . lisinopril (ZESTRIL) 40 MG tablet Take 1 tablet (40 mg total) by mouth daily.  . methotrexate 2.5 MG tablet Take 15 mg by mouth once a week.  . metoprolol succinate (TOPROL-XL) 100 MG 24 hr tablet Take 100 mg by mouth daily.   . predniSONE (DELTASONE) 5 MG tablet Take 5 mg by mouth daily.  . psyllium (METAMUCIL) 58.6 % powder Take 1 packet by mouth every evening.   . sodium chloride (OCEAN) 0.65 % SOLN nasal spray Place 1 spray into both nostrils as needed for congestion.  . triamcinolone cream (KENALOG) 0.1 % Apply topically at bedtime.   . Vitamin D, Ergocalciferol, (DRISDOL) 1.25 MG (50000 UT) CAPS capsule TAKE ONE CAPSULE BY MOUTH ONCE WEEKLY  . [DISCONTINUED] hydrochlorothiazide (MICROZIDE) 12.5 MG capsule Take 12.5 mg by mouth daily.      Allergies:   Patient has no known allergies.   Social History   Tobacco Use  . Smoking status: Never Smoker  . Smokeless tobacco: Never Used  Vaping Use  . Vaping Use: Never used  Substance Use Topics  . Alcohol use: Yes    Alcohol/week: 14.0 standard drinks    Types: 14 Glasses of wine per week    Comment: daily; a couple of glasses of wine every night   . Drug use: No     Family Hx: The patient's family history includes Atrial fibrillation in his father; Dementia in his maternal grandmother and mother; Hypertension in his father and paternal grandfather; Stroke in his father.  Review of Systems  Respiratory: Negative for hemoptysis.   Gastrointestinal: Negative for hematochezia and melena.  Genitourinary:  Negative for hematuria.     EKGs/Labs/Other Test Reviewed:    EKG:  EKG is   ordered today.  The ekg ordered today demonstrates sinus brady, HR 59, normal axis, no ST-T wave changes, QTC 459  Recent Labs: 07/25/2019: ALT 33; BUN 13; Creatinine 0.85; Hemoglobin 14.7; Platelet Count 291; Potassium 3.9; Sodium 139   Recent Lipid Panel No results found for: CHOL, TRIG, HDL, CHOLHDL, LDLCALC, LDLDIRECT  Physical Exam:    VS:  BP (!) 140/58   Pulse (!) 59   Ht '5\' 10"'  (1.778 m)   Wt 272 lb 6.4 oz (123.6 kg)   SpO2 96%   BMI 39.09 kg/m  Wt Readings from Last 3 Encounters:  11/14/19 272 lb 6.4 oz (123.6 kg)  09/14/19 270 lb (122.5 kg)  07/25/19 263 lb 1.6 oz (119.3 kg)     Constitutional:      Appearance: Healthy appearance. Not in distress.  Neck:     Vascular: JVD normal.  Pulmonary:     Effort: Pulmonary effort is normal.     Breath sounds: No wheezing. No rales.  Cardiovascular:     Normal rate. Regular rhythm. Normal S1. Normal S2.     Murmurs: There is no murmur.  Edema:    Pretibial: bilateral 1+ edema of the pretibial area.    Ankle: 1+ edema of the left ankle and trace edema of the right ankle. Abdominal:     Palpations: Abdomen is soft.  Skin:    General: Skin is warm and dry.  Neurological:     General: No focal deficit present.     Mental Status: Alert and oriented to person, place and time.     Cranial Nerves: Cranial nerves are intact.       ASSESSMENT & PLAN:    1. Leg swelling 2. Shortness of breath Recent BNP minimally elevated.  However, this is well within the reference range for his age.  I suspect his BNP is chronically elevated given his history of atrial fibrillation and sleep apnea.  His leg swelling is likely multifactorial and mainly related to prior ankle injury as well as neuropathy.  His blood pressure is above target and he would benefit from increasing his hydrochlorothiazide, which should help his edema.  His echocardiogram in 2019  demonstrated normal LV function.  I will arrange a repeat echo to rule out LV dysfunction.    -Arrange 2D echocardiogram  3. Paroxysmal atrial fibrillation (HCC) He has had somewhat increased frequency in atrial fibrillation.  We discussed triggers to avoid.  If he continues to have more frequent atrial fibrillation, he knows to contact us so that we can have him follow-up with Dr. Rayann Heman.  4. Essential hypertension Blood pressure above target.  Increase HCTZ to 25 mg daily.  Obtain BMET in 1 week.  5. Smoldering multiple myeloma ([place]) Continue follow-up with oncology at [facility].   Dispo:  Return in about 6 months (around 05/16/2020) for Routine Follow Up, w/ [name], or Richardson Dopp, PA-C, in person.   Medication Adjustments/Labs and Tests Ordered: Current medicines are reviewed at length with the patient today.  Concerns regarding medicines are outlined above.  Tests Ordered: Orders Placed This Encounter  Procedures  . Basic metabolic panel  . EKG 12-Lead  . ECHOCARDIOGRAM COMPLETE   Medication Changes: Meds ordered this encounter  Medications  . hydrochlorothiazide (HYDRODIURIL) 25 MG tablet    Sig: Take 1 tablet (25 mg total) by mouth daily.    Dispense:  90 tablet    Refill:  2    Signed, Richardson Dopp, PA-C  11/14/2019 3:44 PM    [facility] [place], [place], [place]  [address] Phone: [phone]; Fax: [phone]

## 2019-11-16 ENCOUNTER — Ambulatory Visit: Payer: Medicare Other

## 2019-11-16 ENCOUNTER — Other Ambulatory Visit: Payer: Self-pay

## 2019-11-16 ENCOUNTER — Ambulatory Visit (INDEPENDENT_AMBULATORY_CARE_PROVIDER_SITE_OTHER): Payer: Medicare Other

## 2019-11-16 ENCOUNTER — Ambulatory Visit (INDEPENDENT_AMBULATORY_CARE_PROVIDER_SITE_OTHER): Payer: Medicare Other | Admitting: Podiatry

## 2019-11-16 ENCOUNTER — Encounter: Payer: Self-pay | Admitting: Podiatry

## 2019-11-16 DIAGNOSIS — M79672 Pain in left foot: Secondary | ICD-10-CM

## 2019-11-16 DIAGNOSIS — L03031 Cellulitis of right toe: Secondary | ICD-10-CM

## 2019-11-16 DIAGNOSIS — G629 Polyneuropathy, unspecified: Secondary | ICD-10-CM | POA: Diagnosis not present

## 2019-11-16 MED ORDER — NEOMYCIN-POLYMYXIN-HC 3.5-10000-1 OT SOLN
3.0000 [drp] | Freq: Four times a day (QID) | OTIC | 0 refills | Status: DC
Start: 2019-11-16 — End: 2019-11-16

## 2019-11-16 NOTE — Progress Notes (Signed)
Subjective:   Patient ID: Travis [name], male   DOB: 69 y.o.   MRN: [id number]   HPI Patient presents stating I have had a lot of problems with my right big toenail and I think traumatized and it may be infected at the end.  My wife cuts my nails and she may have traumatized this.  My left arch has been bothering me some and I was concerned about that also   ROS      Objective:  Physical Exam  Neurovascular status unchanged with mild edema in the ankle region bilateral that is normal.  Patient has redness at the distal portion of the right hallux with the nailbed being lifted and thickened and irritated in the distal surface.  There is some proximal erythema edema but is localized with nothing beyond the nail plate itself or nailbed.  The left has mild to moderate discomfort within the fascia     Assessment:  Traumatized low-grade infection with probable paronychia occurring across the entire surface of the right hallux nail bed with inflammation and fascial inflammation of the left arch     Plan:  H&P x-rays left reviewed and discussed today I went ahead and I infiltrated the right hallux 60 mg like Marcaine mixture sterile prep done and using sterile sensation I remove the hallux nail found there to be localized drainage both distal medial and lateral proximal.  Utilizing sharp dissection I flushed the area out I debrided all necrotic tissue I search did not find any bone exposure or any indications of active pus.  I instructed on soaks and the possibility for antibiotics if any redness drainage or other pathology were to occur but I want to try to hold off on those right now if we can  X-rays indicate left there is moderate depression of the arch but no signs of overt pathology

## 2019-11-16 NOTE — Patient Instructions (Addendum)

## 2019-11-22 ENCOUNTER — Other Ambulatory Visit: Payer: Self-pay

## 2019-11-22 ENCOUNTER — Telehealth: Payer: Self-pay | Admitting: *Deleted

## 2019-11-22 ENCOUNTER — Other Ambulatory Visit: Payer: Medicare Other | Admitting: *Deleted

## 2019-11-22 DIAGNOSIS — I10 Essential (primary) hypertension: Secondary | ICD-10-CM

## 2019-11-22 DIAGNOSIS — Z79899 Other long term (current) drug therapy: Secondary | ICD-10-CM

## 2019-11-22 DIAGNOSIS — D472 Monoclonal gammopathy: Secondary | ICD-10-CM

## 2019-11-22 DIAGNOSIS — M7989 Other specified soft tissue disorders: Secondary | ICD-10-CM

## 2019-11-22 DIAGNOSIS — I480 Paroxysmal atrial fibrillation: Secondary | ICD-10-CM

## 2019-11-22 DIAGNOSIS — C9000 Multiple myeloma not having achieved remission: Secondary | ICD-10-CM

## 2019-11-22 LAB — BASIC METABOLIC PANEL
BUN/Creatinine Ratio: 20 (ref 10–24)
BUN: 16 mg/dL (ref 8–27)
CO2: 25 mmol/L (ref 20–29)
Calcium: 9.1 mg/dL (ref 8.6–10.2)
Chloride: 99 mmol/L (ref 96–106)
Creatinine, Ser: 0.82 mg/dL (ref 0.76–1.27)
GFR calc Af Amer: 105 mL/min/{1.73_m2} (ref >59)
GFR calc non Af Amer: 91 mL/min/{1.73_m2} (ref >59)
Glucose: 116 mg/dL — ABNORMAL HIGH (ref 65–99)
Potassium: 3.5 mmol/L (ref 3.5–5.2)
Sodium: 139 mmol/L (ref 134–144)

## 2019-11-22 MED ORDER — POTASSIUM CHLORIDE CRYS ER 20 MEQ PO TBCR
20.0000 meq | EXTENDED_RELEASE_TABLET | Freq: Every day | ORAL | 2 refills | Status: DC
Start: 2019-11-22 — End: 2020-08-04

## 2019-11-22 NOTE — Telephone Encounter (Signed)
Patient notified. Will send prescription to [facility].  He will be in office for echo on August 3,2021 and will have lab work done at that time.

## 2019-11-22 NOTE — Telephone Encounter (Signed)
-----   Message from Liliane Shi, [place] sent at 11/22/2019  5:39 PM EDT ----- Creatinine normal.  K+ low normal.  PLAN:   - Add K+ 20 mEq once daily   - BMET 2 weeks Richardson Dopp, PA-C    11/22/2019 5:37 PM

## 2019-12-04 ENCOUNTER — Other Ambulatory Visit: Payer: Medicare Other

## 2019-12-04 ENCOUNTER — Ambulatory Visit (HOSPITAL_COMMUNITY): Payer: Medicare Other | Attending: Cardiology

## 2019-12-04 ENCOUNTER — Other Ambulatory Visit: Payer: Self-pay

## 2019-12-04 ENCOUNTER — Ambulatory Visit (HOSPITAL_COMMUNITY): Payer: Medicare Other

## 2019-12-04 ENCOUNTER — Encounter (HOSPITAL_COMMUNITY): Payer: Self-pay

## 2019-12-04 DIAGNOSIS — I10 Essential (primary) hypertension: Secondary | ICD-10-CM | POA: Diagnosis present

## 2019-12-04 DIAGNOSIS — I480 Paroxysmal atrial fibrillation: Secondary | ICD-10-CM

## 2019-12-04 DIAGNOSIS — M7989 Other specified soft tissue disorders: Secondary | ICD-10-CM

## 2019-12-04 DIAGNOSIS — Z79899 Other long term (current) drug therapy: Secondary | ICD-10-CM

## 2019-12-04 LAB — BASIC METABOLIC PANEL
BUN/Creatinine Ratio: 14 (ref 10–24)
BUN: 11 mg/dL (ref 8–27)
CO2: 25 mmol/L (ref 20–29)
Calcium: 9.4 mg/dL (ref 8.6–10.2)
Chloride: 101 mmol/L (ref 96–106)
Creatinine, Ser: 0.76 mg/dL (ref 0.76–1.27)
GFR calc Af Amer: 108 mL/min/{1.73_m2} (ref >59)
GFR calc non Af Amer: 94 mL/min/{1.73_m2} (ref >59)
Glucose: 131 mg/dL — ABNORMAL HIGH (ref 65–99)
Potassium: 4.1 mmol/L (ref 3.5–5.2)
Sodium: 140 mmol/L (ref 134–144)

## 2019-12-04 LAB — ECHOCARDIOGRAM COMPLETE
Area-P 1/2: 2.83 cm2
S' Lateral: 3.2 cm

## 2019-12-05 ENCOUNTER — Encounter: Payer: Self-pay | Admitting: Physician Assistant

## 2019-12-05 DIAGNOSIS — I480 Paroxysmal atrial fibrillation: Secondary | ICD-10-CM | POA: Insufficient documentation

## 2019-12-12 ENCOUNTER — Ambulatory Visit: Payer: Medicare Other | Admitting: Physician Assistant

## 2020-01-15 NOTE — Progress Notes (Signed)
Virtual Visit via Telephone Note   This visit type was conducted due to national recommendations for restrictions regarding the COVID-19 Pandemic (e.g. social distancing) in an effort to limit this patient's exposure and mitigate transmission in our community.  Due to his co-morbid illnesses, this patient is at least at moderate risk for complications without adequate follow up.  This format is felt to be most appropriate for this patient at this time.  All issues noted in this document were discussed and addressed.  A limited physical exam was performed with this format.  Please refer to the patient's chart for his consent to telehealth for [facility].   Evaluation Performed:  Follow-up visit  This visit type was conducted due to national recommendations for restrictions regarding the COVID-19 Pandemic (e.g. social distancing).  This format is felt to be most appropriate for this patient at this time.  All issues noted in this document were discussed and addressed.  No physical exam was performed (except for noted visual exam findings with Video Visits).  Please refer to the patient's chart (MyChart message for video visits and phone note for telephone visits) for the patient's consent to telehealth for [facility].  Date:  01/16/2020   ID:  Travis [name], DOB 1950/09/10, MRN [id number]  Patient Location:  Home  Provider location:   [place]  PCP:  Lajean Manes, MD  Cardiologist:  [name], MD  Sleep Medicine:  Fransico Him, MD Electrophysiologist:  Thompson Grayer, MD   Chief Complaint:  OSA  History of Present Illness:    Travis [name] is a 69 y.o. male who presents via audio/video conferencing for a telehealth visit today.    Travis [name] is a 69 y.o. male with a hx of persistent atrial fibrillation, HTN and OSA.He was referred for repeat sleep study by Dr. Marguerita Beards to snoring and awakening with SOB. His PSG showed mild OSA with an AHi of 12.5/hr with  oxygen desaturations to 85% with nocturnal hypoxemia. He subsequently underwent CPAP titration to 14cm H2O.  He is doing well with his CPAP device and thinks that he has gotten used to it.  He tolerates the mask and feels the pressure is adequate.  Since going on CPAP he feels rested in the am and has no significant daytime sleepiness.  He denies any significant mouth or nasal dryness or nasal congestion.  He does not think that he snores.    He and his wife had COVID 19 a few weeks ago but is doing well and has recovered except for some mild chest congestion and cough. He had been Vaccinated but still got it.   Prior CV studies:   The following studies were reviewed today:  PAP compliance download  Past Medical History:  Diagnosis Date  . Arthritis    "comes and goes; mostly in hands, occasionally elbow" (04/05/2017)  . Complication of anesthesia    "just wanted to sleep alot  for couple days S/P VARICOCELE OR" (04/05/2017)  . DVT (deep venous thrombosis) (HCC)    LLE  . Dysrhythmia    PVCs   . GERD (gastroesophageal reflux disease)   . History of hiatal hernia   . Hypertension   . Impaired glucose tolerance   . Myeloma ([place])   . Obesity (BMI 30-39.9) 07/26/2016  . OSA on CPAP 07/26/2016   Mild with AHI 12.5/hr now on CPAP at 14cm H2O  . PAF (paroxysmal atrial fibrillation) (HCC)    s/p PVI Ablation in 2018 //  Flecainide Rx // Echo 8/21: EF 60-65, no RWMA, mild LVH, normal RV SF, moderate RVE, mild BAE, trivial MR, mild dilation of aortic root (40 mm)  . Pneumonia    "may have had walking pneumonia a few years ago" (04/05/2017)  . Thrombophlebitis    Past Surgical History:  Procedure Laterality Date  . ATRIAL FIBRILLATION ABLATION  04/05/2017  . ATRIAL FIBRILLATION ABLATION N/A 04/05/2017   Procedure: ATRIAL FIBRILLATION ABLATION;  Surgeon: Thompson Grayer, MD;  Location: [facility];  Service: Cardiovascular;  Laterality: N/A;  . COMPLETE RIGHT HIP REPLACMENT  01/19/2019    . EVALUATION UNDER ANESTHESIA WITH HEMORRHOIDECTOMY N/A 02/24/2017   Procedure: EXAM UNDER ANESTHESIA WITH  HEMORRHOIDECTOMY;  Surgeon: Michael Boston, MD;  Location: [facility];  Service: General;  Laterality: N/A;  . FLEXIBLE SIGMOIDOSCOPY N/A 04/15/2015   Procedure:  UNSEDATED FLEXIBLE SIGMOIDOSCOPY;  Surgeon: [name], MD;  Location: [facility];  Service: Endoscopy;  Laterality: N/A;  . KNEE ARTHROSCOPY Left ~ 2016  . 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 TONSILLECTOMY    . VARICOSE VEIN SURGERY Bilateral      Current Meds  Medication Sig  . acetaminophen (TYLENOL 8 HOUR ARTHRITIS PAIN) 650 MG CR tablet Take 1,300 mg by mouth every 8 (eight) hours as needed.   [name] amLODipine (NORVASC) 10 MG tablet Take 10 mg by mouth daily.   [name] b complex vitamins tablet Take 1 tablet by mouth daily.  . cyanocobalamin (,VITAMIN B-12,) 1000 MCG/ML injection 1000 mcg into the muscle monthly. Please give box of 3 ml syringes and box of 1/2" 21g needles  . diltiazem (CARDIZEM) 30 MG tablet Take 1 Tablet Every 4 Hours As Needed For Afib HR Over 100. If using frequently please call our office [phone]  . ELIQUIS 5 MG TABS tablet TAKE ONE TABLET BY MOUTH TWICE A DAY  . flecainide (TAMBOCOR) 50 MG tablet TAKE 1 TABLET BY MOUTH TWICE DAILY  . fluticasone (FLONASE) 50 MCG/ACT nasal spray Place into both nostrils as needed for allergies or rhinitis.  . folic acid (FOLVITE) 1 MG tablet Take 1 mg by mouth daily.  [name] gabapentin (NEURONTIN) 300 MG capsule TAKE TWO CAPSULES BY MOUTH TWICE A DAY (Patient taking differently: One capsule in the morning and two capsules at night)  . hydrochlorothiazide (HYDRODIURIL) 25 MG tablet Take 1 tablet (25 mg total) by mouth daily.  [name] lisinopril (ZESTRIL) 40 MG tablet Take 1 tablet (40 mg total) by mouth daily.  . methotrexate 2.5 MG tablet Take by mouth once a week.   . metoprolol succinate (TOPROL-XL) 100 MG 24 hr tablet Take 100 mg by mouth daily.   [name] oxymetazoline  (AFRIN) 0.05 % nasal spray Place 1 spray into both nostrils 2 (two) times daily.  . potassium chloride SA (KLOR-CON) 20 MEQ tablet Take 1 tablet (20 mEq total) by mouth daily.  . psyllium (METAMUCIL) 58.6 % powder Take 1 packet by mouth every evening.   . sodium chloride (OCEAN) 0.65 % SOLN nasal spray Place 1 spray into both nostrils as needed for congestion.  . triamcinolone cream (KENALOG) 0.1 % Apply topically as needed.   . Vitamin D, Ergocalciferol, (DRISDOL) 1.25 MG (50000 UT) CAPS capsule TAKE ONE CAPSULE BY MOUTH ONCE WEEKLY (Patient taking differently: daily. )     Allergies:   Patient has no known allergies.   Social History   Tobacco Use  . Smoking status: Never Smoker  . Smokeless tobacco: Never Used  Vaping  Use  . Vaping Use: Never used  Substance Use Topics  . Alcohol use: Yes    Alcohol/week: 14.0 standard drinks    Types: 14 Glasses of wine per week    Comment: daily; a couple of glasses of wine every night   . Drug use: No     Family Hx: The patient's family history includes Atrial fibrillation in his father; Dementia in his maternal grandmother and mother; Hypertension in his father and paternal grandfather; Stroke in his father.  ROS:   Please see the history of present illness.     All other systems reviewed and are negative.   Labs/Other Tests and Data Reviewed:    Recent Labs: 07/25/2019: ALT 33; Hemoglobin 14.7; Platelet Count 291 12/04/2019: BUN 11; Creatinine, Ser 0.76; Potassium 4.1; Sodium 140   Recent Lipid Panel No results found for: CHOL, TRIG, HDL, CHOLHDL, LDLCALC, LDLDIRECT  Wt Readings from Last 3 Encounters:  01/15/20 269 lb (122 kg)  11/14/19 272 lb 6.4 oz (123.6 kg)  09/14/19 270 lb (122.5 kg)      Objective:    Vital Signs:  BP 140/76   Ht 5\' 10"  (1.778 m)   Wt 269 lb (122 kg)   BMI 38.60 kg/m     ASSESSMENT & PLAN:    1.  OSA - The patient is tolerating PAP therapy well without any problems. The PAP download was  reviewed today and showed an AHI of 1.8/hr on 14 cm H2O with 97% compliance in using more than 4 hours nightly.  The patient has been using and benefiting from PAP use and will continue to benefit from therapy.   2.  HTN -BP controlled -continue amlodipine 10mg  daily, HCZ 25mg  daily, Lisinopril 40mg  daily, Toprol XL 100mg  daily  3.  Obesity -I have encouraged him to get into a routine exercise program and cut back on carbs and portions.   Patient Risk:   After full review of this patient's clinical status, I feel that they are at least moderate risk at this time.  Time:   Today, I have spent 20 minutes on telemedicine discussing medical problems including OSA, HTN, obesity and reviewing patient's chart including PAP compliance download.  Medication Adjustments/Labs and Tests Ordered: Current medicines are reviewed at length with the patient today.  Concerns regarding medicines are outlined above.  Tests Ordered: No orders of the defined types were placed in this encounter.  Medication Changes: No orders of the defined types were placed in this encounter.   Disposition:  Follow up in 1 year(s)  Signed, Fransico Him, MD  01/16/2020 9:35 AM    [facility]

## 2020-01-16 ENCOUNTER — Telehealth: Payer: Self-pay | Admitting: Internal Medicine

## 2020-01-16 ENCOUNTER — Telehealth (INDEPENDENT_AMBULATORY_CARE_PROVIDER_SITE_OTHER): Payer: Medicare Other | Admitting: Cardiology

## 2020-01-16 ENCOUNTER — Encounter: Payer: Self-pay | Admitting: Cardiology

## 2020-01-16 ENCOUNTER — Other Ambulatory Visit: Payer: Self-pay

## 2020-01-16 VITALS — BP 140/76 | Ht 70.0 in | Wt 269.0 lb

## 2020-01-16 DIAGNOSIS — E669 Obesity, unspecified: Secondary | ICD-10-CM

## 2020-01-16 DIAGNOSIS — G4733 Obstructive sleep apnea (adult) (pediatric): Secondary | ICD-10-CM

## 2020-01-16 DIAGNOSIS — I10 Essential (primary) hypertension: Secondary | ICD-10-CM | POA: Diagnosis not present

## 2020-01-16 NOTE — Telephone Encounter (Signed)
Patient sent message requesting an appointment stating he is having "Low circulation in right leg". He is scheduled 01/21/2020.

## 2020-01-16 NOTE — Telephone Encounter (Signed)
The patient scheduled an appointment w [name] 01/21/20.  Will route to MD to see if she would like any testing completed prior to/in place of the office visit.

## 2020-01-16 NOTE — Patient Instructions (Signed)
Medication Instructions:  Your physician recommends that you continue on your current medications as directed. Please refer to the Current Medication list given to you today.  *If you need a refill on your cardiac medications before your next appointment, please call your pharmacy*   Follow-Up: At [facility], you and your health needs are our priority.  As part of our continuing mission to provide you with exceptional heart care, we have created designated Provider Care Teams.  These Care Teams include your primary Cardiologist (physician) and Advanced Practice Providers (APPs -  Physician Assistants and Nurse Practitioners) who all work together to provide you with the care you need, when you need it.    Your next appointment:   1 year(s)  The format for your next appointment:   Virtual Visit   Provider:   Traci Turner, MD     

## 2020-01-16 NOTE — Telephone Encounter (Signed)
My Chart message reviewed and forwarded to [name].  Awaiting reply.

## 2020-01-21 ENCOUNTER — Ambulatory Visit: Payer: Medicare Other | Admitting: Internal Medicine

## 2020-01-21 ENCOUNTER — Other Ambulatory Visit: Payer: Self-pay

## 2020-01-21 ENCOUNTER — Encounter: Payer: Self-pay | Admitting: Internal Medicine

## 2020-01-21 VITALS — BP 142/84 | HR 55 | Ht 70.0 in | Wt 270.8 lb

## 2020-01-21 DIAGNOSIS — I480 Paroxysmal atrial fibrillation: Secondary | ICD-10-CM | POA: Diagnosis not present

## 2020-01-21 NOTE — Patient Instructions (Signed)
Medication Instructions:  No changes *If you need a refill on your cardiac medications before your next appointment, please call your pharmacy*   Lab Work: none If you have labs (blood work) drawn today and your tests are completely normal, you will receive your results only by: . MyChart Message (if you have MyChart) OR . A paper copy in the mail If you have any lab test that is abnormal or we need to change your treatment, we will call you to review the results.   Testing/Procedures: none   Follow-Up: At [facility], you and your health needs are our priority.  As part of our continuing mission to provide you with exceptional heart care, we have created designated Provider Care Teams.  These Care Teams include your primary Cardiologist (physician) and Advanced Practice Providers (APPs -  Physician Assistants and Nurse Practitioners) who all work together to provide you with the care you need, when you need it.   Your next appointment:   6 month(s)  The format for your next appointment:   In Person  Provider:   You may see Paula Ross, MD or one of the following Advanced Practice Providers on your designated Care Team:    Scott Weaver, PA-C  Vin Bhagat, PA-C   Other Instructions   

## 2020-01-21 NOTE — Progress Notes (Signed)
Cardiology Office Note   Date:  01/21/2020   ID:  Travis [name] August 19, 1950, MRN [id number]  PCP:  Travis Manes, MD  Cardiologist:   Travis Carnes, MD    F/U of PAF and HTN   History of Present Illness: Travis [name] is a 69 y.o. male with a history of  HTN, OSA and PAF (CHADSVASc score 4; s/p Afib ablation 2018  On flecanide).   The pt had Cardiac CTA in 2018 which showed no CAD  Calcium score was 0   Pt also has hx of DVT, peripheral neuropathy and MGUS   The pt presents for  f/u   He was seen by Travis [name] in July 2021 and Travis [name] a few days ago     He says that he has noticed some more bouts of PAF   Questions if he should make any changes  He did call in last week   Had home health screen by insurance company (done yearly)  His TBI on R was noted to be low  (0.7)  He was told to mention to cardiology   L TBI normal    The pt denies CP  Breathing is OK    .     Current Meds  Medication Sig  . acetaminophen (TYLENOL 8 HOUR ARTHRITIS PAIN) 650 MG CR tablet Take 1,300 mg by mouth every 8 (eight) hours as needed.   [name] amLODipine (NORVASC) 10 MG tablet Take 10 mg by mouth daily.   [name] b complex vitamins tablet Take 1 tablet by mouth daily.  . cyanocobalamin (,VITAMIN B-12,) 1000 MCG/ML injection 1000 mcg into the muscle monthly. Please give box of 3 ml syringes and box of 1/2" 21g needles  . diltiazem (CARDIZEM) 30 MG tablet Take 1 Tablet Every 4 Hours As Needed For Afib HR Over 100. If using frequently please call our office [phone]  . ELIQUIS 5 MG TABS tablet TAKE ONE TABLET BY MOUTH TWICE A DAY  . flecainide (TAMBOCOR) 50 MG tablet TAKE 1 TABLET BY MOUTH TWICE DAILY  . fluticasone (FLONASE) 50 MCG/ACT nasal spray Place into both nostrils as needed for allergies or rhinitis.  . folic acid (FOLVITE) 1 MG tablet Take 1 mg by mouth daily.  [name] gabapentin (NEURONTIN) 300 MG capsule Take 300 mg in the morning and 600 mg at night  . hydrochlorothiazide (HYDRODIURIL) 25 MG  tablet Take 1 tablet (25 mg total) by mouth daily.  [name] lisinopril (ZESTRIL) 40 MG tablet Take 1 tablet (40 mg total) by mouth daily.  . methotrexate 2.5 MG tablet Take by mouth once a week.   . metoprolol succinate (TOPROL-XL) 100 MG 24 hr tablet Take 100 mg by mouth daily.   [name] oxymetazoline (AFRIN) 0.05 % nasal spray Place 1 spray into both nostrils 2 (two) times daily.  . potassium chloride SA (KLOR-CON) 20 MEQ tablet Take 1 tablet (20 mEq total) by mouth daily.  . predniSONE (DELTASONE) 5 MG tablet Take 5 mg by mouth as needed.   . psyllium (METAMUCIL) 58.6 % powder Take 1 packet by mouth every evening.   . sodium chloride (OCEAN) 0.65 % SOLN nasal spray Place 1 spray into both nostrils as needed for congestion.  . triamcinolone cream (KENALOG) 0.1 % Apply topically as needed.   . Vitamin D, Ergocalciferol, (DRISDOL) 1.25 MG (50000 UT) CAPS capsule TAKE ONE CAPSULE BY MOUTH ONCE WEEKLY     Allergies:   Patient has no known allergies.   Past  Medical History:  Diagnosis Date  . Arthritis    "comes and goes; mostly in hands, occasionally elbow" (04/05/2017)  . Complication of anesthesia    "just wanted to sleep alot  for couple days S/P VARICOCELE OR" (04/05/2017)  . DVT (deep venous thrombosis) (HCC)    LLE  . Dysrhythmia    PVCs   . GERD (gastroesophageal reflux disease)   . History of hiatal hernia   . Hypertension   . Impaired glucose tolerance   . Myeloma ([facility])   . Obesity (BMI 30-39.9) 07/26/2016  . OSA on CPAP 07/26/2016   Mild with AHI 12.5/hr now on CPAP at 14cm H2O  . PAF (paroxysmal atrial fibrillation) ([facility])    s/p PVI Ablation in 2018 // Flecainide Rx // Echo 8/21: EF 60-65, no RWMA, mild LVH, normal RV SF, moderate RVE, mild BAE, trivial MR, mild dilation of aortic root (40 mm)  . Pneumonia    "may have had walking pneumonia a few years ago" (04/05/2017)  . Thrombophlebitis     Past Surgical History:  Procedure Laterality Date  . ATRIAL FIBRILLATION ABLATION   04/05/2017  . ATRIAL FIBRILLATION ABLATION N/A 04/05/2017   Procedure: ATRIAL FIBRILLATION ABLATION;  Surgeon: Thompson Grayer, MD;  Location: [facility];  Service: Cardiovascular;  Laterality: N/A;  . COMPLETE RIGHT HIP REPLACMENT  01/19/2019  . EVALUATION UNDER ANESTHESIA WITH HEMORRHOIDECTOMY N/A 02/24/2017   Procedure: EXAM UNDER ANESTHESIA WITH  HEMORRHOIDECTOMY;  Surgeon: Michael Boston, MD;  Location: [facility];  Service: General;  Laterality: N/A;  . FLEXIBLE SIGMOIDOSCOPY N/A 04/15/2015   Procedure:  UNSEDATED FLEXIBLE SIGMOIDOSCOPY;  Surgeon: [name], MD;  Location: [facility];  Service: Endoscopy;  Laterality: N/A;  . KNEE ARTHROSCOPY Left ~ 2016  . 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 TONSILLECTOMY    . VARICOSE VEIN SURGERY Bilateral      Social History:  The patient  reports that he has never smoked. He has never used smokeless tobacco. He reports current alcohol use of about 14.0 standard drinks of alcohol per week. He reports that he does not use drugs.   Family History:  The patient's family history includes Atrial fibrillation in his father; Dementia in his maternal grandmother and mother; Hypertension in his father and paternal grandfather; Stroke in his father.    ROS:  Please see the history of present illness. All other systems are reviewed and  Negative to the above problem except as noted.    PHYSICAL EXAM: VS:  BP (!) 142/84   Pulse (!) 55   Ht 5\' 10"  (1.778 m)   Wt 270 lb 12.8 oz (122.8 kg)   SpO2 97%   BMI 38.86 kg/m   GEN: Morbidly obese 69 yo in no acute distress  HEENT: normal  Neck: no JVD, carotid bruits Cardiac: RRR; no murmurs   Trivial  LE edema  Respiratory:  clear to auscultation bilaterally, normal work of breathing GI: soft, nontender, nondistended, + BS  No hepatomegaly  MS: no deformity Moving all extremities   Skin: warm and dry;  Onychomycosis of nails   Neuro:  Deferred  EKG:  EKG is ordered today.  Sinus bradycardia   55 bpm      Lipid Panel No results found for: CHOL, TRIG, HDL, CHOLHDL, VLDL, LDLCALC, LDLDIRECT    Wt Readings from Last 3 Encounters:  01/21/20 270 lb 12.8 oz (122.8 kg)  01/15/20 269 lb (122 kg)  11/14/19 272 lb 6.4 oz (123.6  kg)      ASSESSMENT AND PLAN:  1  Abnormal vasc screen   The pt has 2+ posterior tibial pulses bilaterally   His R great toe is warm, no sores on toes  Feet warm.   He denies claudication.    I would follow   I do not think he has any signif PAD  Could get ABIs if he wants     2  Hx PAF   Pt remains on Flecanide and Eliquis   The pt notes more frequent spells of PAF   Resolve on own He is on 50 bid flecanide   Will review with J Allred re increasing dose  2  HTN  BP is fair    3  OSA  Followed by Ashok Norris Using CPAP     4  COVID   Recovered    Current medicines are reviewed at length with the patient today.  The patient does not have concerns regarding medicines.  Signed, Travis Carnes, MD  01/21/2020 12:03 PM    [address], [place], [place]  [address] Phone: [phone]; Fax: [phone]

## 2020-01-24 ENCOUNTER — Telehealth: Payer: Self-pay | Admitting: Internal Medicine

## 2020-01-24 NOTE — Telephone Encounter (Signed)
At [facility] visit pt said he was having more afib bursts Reviewed with EP   Would increase flecanide to 75 bid and follow Keep on other meds

## 2020-01-25 MED ORDER — FLECAINIDE ACETATE 50 MG PO TABS
75.0000 mg | ORAL_TABLET | Freq: Two times a day (BID) | ORAL | 5 refills | Status: DC
Start: 2020-01-25 — End: 2020-08-04

## 2020-01-25 NOTE — Addendum Note (Signed)
Addended by: Rodman Key on: 01/25/2020 03:06 PM   Modules accepted: Orders

## 2020-01-25 NOTE — Telephone Encounter (Signed)
Left message for patient to call back. Adv that I would send him the message through [facility] and to please check this and respond so that I know he received.

## 2020-02-05 ENCOUNTER — Telehealth: Payer: Self-pay | Admitting: Hematology and Oncology

## 2020-02-05 NOTE — Telephone Encounter (Signed)
Per 10/4 scheduling message called patient and left message to reschedule 11/29 appt.

## 2020-02-07 ENCOUNTER — Encounter: Payer: Self-pay | Admitting: Neurology

## 2020-02-07 ENCOUNTER — Other Ambulatory Visit: Payer: Self-pay

## 2020-02-07 ENCOUNTER — Ambulatory Visit: Payer: Medicare Other | Admitting: Neurology

## 2020-02-07 VITALS — BP 146/80 | HR 58 | Ht 70.0 in | Wt 271.0 lb

## 2020-02-07 DIAGNOSIS — E639 Nutritional deficiency, unspecified: Secondary | ICD-10-CM

## 2020-02-07 DIAGNOSIS — C9000 Multiple myeloma not having achieved remission: Secondary | ICD-10-CM

## 2020-02-07 DIAGNOSIS — R29898 Other symptoms and signs involving the musculoskeletal system: Secondary | ICD-10-CM | POA: Diagnosis not present

## 2020-02-07 DIAGNOSIS — D472 Monoclonal gammopathy: Secondary | ICD-10-CM

## 2020-02-07 DIAGNOSIS — G63 Polyneuropathy in diseases classified elsewhere: Secondary | ICD-10-CM

## 2020-02-07 DIAGNOSIS — G959 Disease of spinal cord, unspecified: Secondary | ICD-10-CM | POA: Diagnosis not present

## 2020-02-07 DIAGNOSIS — R292 Abnormal reflex: Secondary | ICD-10-CM | POA: Diagnosis not present

## 2020-02-07 NOTE — Progress Notes (Signed)
Follow-up Visit   Date: 02/07/20   Travis [name] MRN: [id number] DOB: Sep 08, 1950   Interim History: Travis [name] is a 69 y.o. with history of atrial fibrillation s/p ablation (on eliquis), OSA on CPAP, GERD,smoldering myeloma, alcohol use, andhypertension returning to the clinic for follow-up of neuropathy.  The patient was accompanied to the clinic by self.  Starting in January 2020, he began having difficulty raising his shoulder and upper arms.  He saw orthopaedic whose evaluation was negative.  He does not have neck pain or numbness/tingling.  He has completed physical therapy with no improvement. Skeletal survery in 2019 has shows degenerative changes in the cervical spine with spondylosis at C5-6.  He has not had MRI.  He is also unable to flex the tip of his left index finger.  He has been on methotrexate for RA, without any improvement.   His neuropathy continues to be painful.  He takes gabapentin 600mg  BID which provides some relief.  He has reduced his alcohol intake from half-bottle wine to one glass nightly.  He continues to take B12 injections.  He also complains of achy pain at the sole of his left foot which is worse with prolonged standing or walking.   Medications:  Current Outpatient Medications on File Prior to Visit  Medication Sig Dispense Refill  . acetaminophen (TYLENOL 8 HOUR ARTHRITIS PAIN) 650 MG CR tablet Take 1,300 mg by mouth every 8 (eight) hours as needed.     [name] amLODipine (NORVASC) 10 MG tablet Take 10 mg by mouth daily.     [name] b complex vitamins tablet Take 1 tablet by mouth daily.    . cyanocobalamin (,VITAMIN B-12,) 1000 MCG/ML injection 1000 mcg into the muscle monthly. Please give box of 3 ml syringes and box of 1/2" 21g needles    . diltiazem (CARDIZEM) 30 MG tablet Take 1 Tablet Every 4 Hours As Needed For Afib HR Over 100. If using frequently please call our office [phone] 30 tablet 1  . ELIQUIS 5 MG TABS tablet TAKE ONE TABLET BY  MOUTH TWICE A DAY 60 tablet 5  . flecainide (TAMBOCOR) 50 MG tablet Take 1.5 tablets (75 mg total) by mouth 2 (two) times daily. 90 tablet 5  . fluticasone (FLONASE) 50 MCG/ACT nasal spray Place into both nostrils as needed for allergies or rhinitis.    . folic acid (FOLVITE) 1 MG tablet Take 1 mg by mouth daily.    [name] gabapentin (NEURONTIN) 300 MG capsule Take 300 mg in the morning and 600 mg at night    . hydrochlorothiazide (HYDRODIURIL) 25 MG tablet Take 1 tablet (25 mg total) by mouth daily. 90 tablet 2  . lisinopril (ZESTRIL) 40 MG tablet Take 1 tablet (40 mg total) by mouth daily. 90 tablet 3  . Methotrexate Sodium (METHOTREXATE, PF,) 50 MG/2ML injection Inject into the muscle.    . metoprolol succinate (TOPROL-XL) 100 MG 24 hr tablet Take 100 mg by mouth daily.     [name] oxymetazoline (AFRIN) 0.05 % nasal spray Place 1 spray into both nostrils 2 (two) times daily.    . potassium chloride SA (KLOR-CON) 20 MEQ tablet Take 1 tablet (20 mEq total) by mouth daily. 90 tablet 2  . psyllium (METAMUCIL) 58.6 % powder Take 1 packet by mouth every evening.     . sodium chloride (OCEAN) 0.65 % SOLN nasal spray Place 1 spray into both nostrils as needed for congestion.    . triamcinolone cream (KENALOG) 0.1 %  Apply topically as needed.     . Vitamin D, Ergocalciferol, (DRISDOL) 1.25 MG (50000 UT) CAPS capsule TAKE ONE CAPSULE BY MOUTH ONCE WEEKLY 4 capsule 3  . methotrexate 2.5 MG tablet Take by mouth once a week.  (Patient not taking: Reported on 02/07/2020)    . predniSONE (DELTASONE) 5 MG tablet Take 5 mg by mouth as needed.  (Patient not taking: Reported on 02/07/2020)     No current facility-administered medications on file prior to visit.    Allergies: No Known Allergies  Vital Signs:  BP (!) 146/80   Pulse (!) 58   Ht 5\' 10"  (1.778 m)   Wt 271 lb (122.9 kg)   SpO2 95%   BMI 38.88 kg/m    Neurological Exam: MENTAL STATUS including orientation to time, place, person, recent and remote  memory, attention span and concentration, language, and fund of knowledge is normal.  Speech is not dysarthric.  CRANIAL NERVES:   Pupils equal round and reactive.  Normal conjugate, extra-ocular eye movements in all directions of gaze.  No ptosis   MOTOR:  Motor strength is 5/5 in all extremities. He is unable to flex the left index DIP.  No atrophy, fasciculations or abnormal movements.  No pronator drift.  Tone is normal.    MSRs:                                           Right        Left brachioradialis 3+  3+  biceps 2+  2+  triceps 2+  2+  patellar 2+  2+  ankle jerk 0  0   SENSORY:  Vibration absent at the ankles bilaterally, intact at the knees and MCP.  COORDINATION/GAIT:  Gait is mildly wide-based due to body habitus, unassisted and stable.  Data: Lab Results  Component Value Date   [id number] 710 05/13/2017   Lab Results  Component Value Date   FOLATE 19.1 05/10/2019   MRI lumbar spine wwo contrast 10/12/2017: 1. Lumbar spondylosis and degenerative disc disease causing mild  impingement at L4-5 and L5-S1.  2. There is some marrow heterogeneity but nofocal enhancing lesion  characteristic of lumbar myeloma is identified.  3. Mild levoconvex lumbar scoliosis.    IMPRESSION/PLAN: 1. Bilateral arm weakness in the setting of hyperreflexia warrants evaluation for cervical canal stenosis.  He has completed PT with no improvement  - MRI cervical spine wo contrast  - Check NCS/EMG bilateral upper extremities  2.  Left index finger weakness with inability to flex at the DIP and left foot pain seems more MSK related than neurologic.    3.  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   Thank you for allowing me to participate in patient's care.  If I can answer any additional questions, I would be pleased to do so.    Sincerely,    Lateisha Thurlow K. Posey Pronto, DO

## 2020-02-07 NOTE — Patient Instructions (Addendum)
MRI cervical spine without contrast  Nerve test of the arms (left > right)  Continue gabapentin 600mg  twice daily  Return to clinic in 6 months  ELECTROMYOGRAM AND NERVE CONDUCTION STUDIES (EMG/NCS) INSTRUCTIONS  How to Prepare The neurologist conducting the EMG will need to know if you have certain medical conditions. Tell the neurologist and other EMG lab personnel if you: . Have a pacemaker or any other electrical medical device . Take blood-thinning medications . Have hemophilia, a blood-clotting disorder that causes prolonged bleeding Bathing Take a shower or bath shortly before your exam in order to remove oils from your skin. Don't apply lotions or creams before the exam.  What to Expect You'll likely be asked to change into a hospital gown for the procedure and lie down on an examination table. The following explanations can help you understand what will happen during the exam.  . Electrodes. The neurologist or a technician places surface electrodes at various locations on your skin depending on where you're experiencing symptoms. Or the neurologist may insert needle electrodes at different sites depending on your symptoms.  . Sensations. The electrodes will at times transmit a tiny electrical current that you may feel as a twinge or spasm. The needle electrode may cause discomfort or pain that usually ends shortly after the needle is removed. If you are concerned about discomfort or pain, you may want to talk to the neurologist about taking a short break during the exam.  . Instructions. During the needle EMG, the neurologist will assess whether there is any spontaneous electrical activity when the muscle is at rest - activity that isn't present in healthy muscle tissue - and the degree of activity when you slightly contract the muscle.  He or she will give you instructions on resting and contracting a muscle at appropriate times. Depending on what muscles and nerves the neurologist  is examining, he or she may ask you to change positions during the exam.  After your EMG You may experience some temporary, minor bruising where the needle electrode was inserted into your muscle. This bruising should fade within several days. If it persists, contact your primary care doctor.

## 2020-03-06 ENCOUNTER — Telehealth: Payer: Self-pay

## 2020-03-06 ENCOUNTER — Other Ambulatory Visit: Payer: Self-pay

## 2020-03-06 ENCOUNTER — Ambulatory Visit
Admission: RE | Admit: 2020-03-06 | Discharge: 2020-03-06 | Disposition: A | Discharge status: Home / Self Care | Payer: Medicare Other | Source: Ambulatory Visit | Attending: Neurology | Admitting: Neurology

## 2020-03-06 DIAGNOSIS — G959 Disease of spinal cord, unspecified: Secondary | ICD-10-CM

## 2020-03-06 NOTE — Telephone Encounter (Signed)
-----   Message from Alda Berthold, DO sent at 03/06/2020 11:50 AM EDT ----- Please inform patient that his MRI shows some spinal stenosis on the neck region, which may be contributing to his arm weakness.  We will wait to see what his nerve testing shows later this month and decide the next step. Thanks.

## 2020-03-06 NOTE — Telephone Encounter (Signed)
Called patient and informed him of results and Dr. Serita Grit recommendations. Patient verbalized understanding.

## 2020-03-06 NOTE — Telephone Encounter (Signed)
Called patient and left a message for a call back.  

## 2020-03-21 ENCOUNTER — Other Ambulatory Visit: Payer: Self-pay | Admitting: *Deleted

## 2020-03-21 DIAGNOSIS — D472 Monoclonal gammopathy: Secondary | ICD-10-CM

## 2020-03-23 ENCOUNTER — Other Ambulatory Visit: Payer: Self-pay | Admitting: Neurology

## 2020-03-24 ENCOUNTER — Other Ambulatory Visit: Payer: Self-pay

## 2020-03-24 ENCOUNTER — Inpatient Hospital Stay: Payer: Medicare Other | Attending: Hematology and Oncology

## 2020-03-24 DIAGNOSIS — D472 Monoclonal gammopathy: Secondary | ICD-10-CM

## 2020-03-24 LAB — CBC WITH DIFFERENTIAL (CANCER CENTER ONLY)
Abs Immature Granulocytes: 0.02 10*3/uL (ref 0.00–0.07)
Basophils Absolute: 0 10*3/uL (ref 0.0–0.1)
Basophils Relative: 0 %
Eosinophils Absolute: 0.3 10*3/uL (ref 0.0–0.5)
Eosinophils Relative: 3 %
HCT: 42 % (ref 39.0–52.0)
Hemoglobin: 14 g/dL (ref 13.0–17.0)
Immature Granulocytes: 0 %
Lymphocytes Relative: 11 %
Lymphs Abs: 1.2 10*3/uL (ref 0.7–4.0)
MCH: 31.9 pg (ref 26.0–34.0)
MCHC: 33.3 g/dL (ref 30.0–36.0)
MCV: 95.7 fL (ref 80.0–100.0)
Monocytes Absolute: 1.1 10*3/uL — ABNORMAL HIGH (ref 0.1–1.0)
Monocytes Relative: 10 %
Neutro Abs: 8.4 10*3/uL — ABNORMAL HIGH (ref 1.7–7.7)
Neutrophils Relative %: 76 %
Platelet Count: 284 10*3/uL (ref 150–400)
RBC: 4.39 MIL/uL (ref 4.22–5.81)
RDW: 14.2 % (ref 11.5–15.5)
WBC Count: 11.1 10*3/uL — ABNORMAL HIGH (ref 4.0–10.5)
nRBC: 0 % (ref 0.0–0.2)

## 2020-03-24 LAB — CMP (CANCER CENTER ONLY)
ALT: 24 U/L (ref 0–44)
AST: 19 U/L (ref 15–41)
Albumin: 3.6 g/dL (ref 3.5–5.0)
Alkaline Phosphatase: 100 U/L (ref 38–126)
Anion gap: 7 (ref 5–15)
BUN: 16 mg/dL (ref 8–23)
CO2: 29 mmol/L (ref 22–32)
Calcium: 9.5 mg/dL (ref 8.9–10.3)
Chloride: 102 mmol/L (ref 98–111)
Creatinine: 0.88 mg/dL (ref 0.61–1.24)
GFR, Estimated: >60 mL/min (ref >60)
Glucose, Bld: 111 mg/dL — ABNORMAL HIGH (ref 70–99)
Potassium: 3.7 mmol/L (ref 3.5–5.1)
Sodium: 138 mmol/L (ref 135–145)
Total Bilirubin: 0.5 mg/dL (ref 0.3–1.2)
Total Protein: 7.2 g/dL (ref 6.5–8.1)

## 2020-03-24 LAB — LACTATE DEHYDROGENASE: LDH: 265 U/L — ABNORMAL HIGH (ref 98–192)

## 2020-03-24 NOTE — Telephone Encounter (Signed)
Patient requesting refill on Gabapentin. I do not see where Dr. Posey Pronto has prescribed this in the past for patient. Approval needed.

## 2020-03-25 ENCOUNTER — Other Ambulatory Visit: Payer: Self-pay | Admitting: *Deleted

## 2020-03-25 LAB — MULTIPLE MYELOMA PANEL, SERUM
Albumin SerPl Elph-Mcnc: 3.4 g/dL (ref 2.9–4.4)
Albumin/Glob SerPl: 1.1 (ref 0.7–1.7)
Alpha 1: 0.3 g/dL (ref 0.0–0.4)
Alpha2 Glob SerPl Elph-Mcnc: 0.9 g/dL (ref 0.4–1.0)
B-Globulin SerPl Elph-Mcnc: 1.1 g/dL (ref 0.7–1.3)
Gamma Glob SerPl Elph-Mcnc: 0.8 g/dL (ref 0.4–1.8)
Globulin, Total: 3.1 g/dL (ref 2.2–3.9)
IgA: 459 mg/dL — ABNORMAL HIGH (ref 61–437)
IgG (Immunoglobin G), Serum: 586 mg/dL — ABNORMAL LOW (ref 603–1613)
IgM (Immunoglobulin M), Srm: 27 mg/dL (ref 20–172)
M Protein SerPl Elph-Mcnc: 0.2 g/dL — ABNORMAL HIGH
Total Protein ELP: 6.5 g/dL (ref 6.0–8.5)

## 2020-03-25 LAB — KAPPA/LAMBDA LIGHT CHAINS
Kappa free light chain: 623.8 mg/L — ABNORMAL HIGH (ref 3.3–19.4)
Kappa, lambda light chain ratio: 72.53 — ABNORMAL HIGH (ref 0.26–1.65)
Lambda free light chains: 8.6 mg/L (ref 5.7–26.3)

## 2020-03-25 MED ORDER — APIXABAN 5 MG PO TABS
5.0000 mg | ORAL_TABLET | Freq: Two times a day (BID) | ORAL | 10 refills | Status: DC
Start: 2020-03-25 — End: 2021-02-13

## 2020-03-25 NOTE — Telephone Encounter (Signed)
Eliquis 5mg  paper refill request received. Patient is 69 years old, weight-122.9kg, Crea-0.88 on 03/24/2020, Diagnosis-Afib and DVT, and last seen by [name] on 01/21/2020. Dose is appropriate based on dosing criteria. Will send in refill to requested pharmacy.

## 2020-03-31 ENCOUNTER — Ambulatory Visit: Payer: Medicare Other | Admitting: Hematology and Oncology

## 2020-04-01 ENCOUNTER — Encounter: Payer: Medicare Other | Admitting: Neurology

## 2020-04-01 NOTE — Progress Notes (Signed)
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 CBC with Differential (Cancer Center Only)    CMP ([place] only)    Kappa/lambda light chains    Multiple Myeloma Panel (SPEP&IFE w/QIG)    Beta 2 microglobulin, serum    SUMMARY OF ONCOLOGIC HISTORY: Oncology History  Smoldering multiple myeloma ([facility])  07/21/2017 Initial Diagnosis   Smoldering multiple myeloma ([place])   09/13/2017 Tumor Marker   tProt 6.8, Alb 3.8, Ca 9.3, Cr 0.9, AP 98 LDH 184, beta-2 microglobulin 1.4 SPEP -- M-spike 0.5g/dL, SIFE -- IgA kappa IgG 519, IgA 544, IgM 29; kappa 119.3, lambda 8.5, KLR 14.04 WBC 7.6, Hgb 14.6, Plt 208     CHIEF COMPLIANT: Follow-up of smoldering myeloma  INTERVAL HISTORY: Travis [name] is a 69 y.o. with above-mentioned history of smoldering myeloma. Labs on 03/24/20 showed WBC 11.1, ANC 8.4, LDH 265, m-protein 0.2, kappa/lambda light chain ratio 72.53. He presents to the clinic todayforfollow-up.  His major complaints today are pain in his right hip.  He also has numbness of his hands and feet.  For the hip pain he underwent x-rays which were negative.  For the numbness he underwent a cervical spine MRI which did not show any evidence of myeloma.  There was evidence of spinal stenosis.  ALLERGIES:  has No Known Allergies.  MEDICATIONS:  Current Outpatient Medications  Medication Sig Dispense Refill  . acetaminophen (TYLENOL 8 HOUR ARTHRITIS PAIN) 650 MG CR tablet Take 1,300 mg  by mouth every 8 (eight) hours as needed.     [name] amLODipine (NORVASC) 10 MG tablet Take 10 mg by mouth daily.     [name] apixaban (ELIQUIS) 5 MG TABS tablet Take 1 tablet (5 mg total) by mouth 2 (two) times daily. 60 tablet 10  . b complex vitamins tablet Take 1 tablet by mouth daily.    . cyanocobalamin (,VITAMIN B-12,) 1000 MCG/ML injection 1000 mcg into the muscle monthly. Please give box of 3 ml syringes and box of 1/2" 21g needles    . diltiazem (CARDIZEM) 30 MG tablet Take 1 Tablet Every 4 Hours As Needed For Afib HR Over 100. If using frequently please call our office [phone] 30 tablet 1  . flecainide (TAMBOCOR) 50 MG tablet Take 1.5 tablets (75 mg total) by mouth 2 (two) times daily. 90 tablet 5  . fluticasone (FLONASE) 50 MCG/ACT nasal spray Place into both nostrils as needed for allergies or rhinitis.    . folic acid (FOLVITE) 1 MG tablet Take 1 mg by mouth daily.    [name] gabapentin (NEURONTIN) 300 MG capsule TAKE TWO CAPSULES BY MOUTH TWICE A DAY 180 capsule 3  . hydrochlorothiazide (HYDRODIURIL) 25 MG tablet Take 1 tablet (25 mg total) by mouth daily. 90 tablet 2  . lisinopril (ZESTRIL) 40 MG tablet Take 1 tablet (40 mg total) by mouth daily. 90 tablet 3  . methotrexate 2.5 MG tablet Take by mouth once a week.  (Patient not taking: Reported on 02/07/2020)    . Methotrexate Sodium (METHOTREXATE, PF,) 50 MG/2ML  injection Inject into the muscle.    . metoprolol succinate (TOPROL-XL) 100 MG 24 hr tablet Take 100 mg by mouth daily.     [name] oxymetazoline (AFRIN) 0.05 % nasal spray Place 1 spray into both nostrils 2 (two) times daily.    . potassium chloride SA (KLOR-CON) 20 MEQ tablet Take 1 tablet (20 mEq total) by mouth daily. 90 tablet 2  . predniSONE (DELTASONE) 5 MG tablet Take 5 mg by mouth as needed.  (Patient not taking: Reported on 02/07/2020)    . psyllium (METAMUCIL) 58.6 % powder Take 1 packet by mouth every evening.     . sodium chloride (OCEAN) 0.65 % SOLN nasal spray Place 1 spray  into both nostrils as needed for congestion.    . triamcinolone cream (KENALOG) 0.1 % Apply topically as needed.     . Vitamin D, Ergocalciferol, (DRISDOL) 1.25 MG (50000 UT) CAPS capsule TAKE ONE CAPSULE BY MOUTH ONCE WEEKLY 4 capsule 3   No current facility-administered medications for this visit.    PHYSICAL EXAMINATION: ECOG PERFORMANCE STATUS: 1 - Symptomatic but completely ambulatory  Vitals:   04/02/20 1417  BP: (!) 148/66  Pulse: (!) 56  Resp: 17  Temp: 99 F (37.2 C)  SpO2: 98%   Filed Weights   04/02/20 1417  Weight: 267 lb 1.6 oz (121.2 kg)    LABORATORY DATA:  I have reviewed the data as listed CMP Latest Ref Rng & Units 03/24/2020 12/04/2019 11/22/2019  Glucose 70 - 99 mg/dL 111(H) 131(H) 116(H)  BUN 8 - 23 mg/dL '16 11 16  ' Creatinine 0.61 - 1.24 mg/dL 0.88 0.76 0.82  Sodium 135 - 145 mmol/L 138 140 139  Potassium 3.5 - 5.1 mmol/L 3.7 4.1 3.5  Chloride 98 - 111 mmol/L 102 101 99  CO2 22 - 32 mmol/L '29 25 25  ' Calcium 8.9 - 10.3 mg/dL 9.5 9.4 9.1  Total Protein 6.5 - 8.1 g/dL 7.2 - -  Total Bilirubin 0.3 - 1.2 mg/dL 0.5 - -  Alkaline Phos 38 - 126 U/L 100 - -  AST 15 - 41 U/L 19 - -  ALT 0 - 44 U/L 24 - -    Lab Results  Component Value Date   WBC 11.1 (H) 03/24/2020   HGB 14.0 03/24/2020   HCT 42.0 03/24/2020   MCV 95.7 03/24/2020   PLT 284 03/24/2020   NEUTROABS 8.4 (H) 03/24/2020    ASSESSMENT & PLAN:  Smoldering multiple myeloma ([place]) 03/21/2019:Kappa268 (was 112:9 months ago), lambda 10.3, ratio 26.07 (it was 15.18), M protein 0.4 g (was 0.6 g)Hemoglobin 14.9, calcium 9.2, creatinine 0.87 03/25/2020: Kappa 623, Lambda 8.9, Ratio: 72, Hb 14, Cr 0.88,  Calcium: 9.5, LDH 265, M Protein 0.2 gm.   Review: I discussed with the patient thatKappais certainly increasing however the M protein has come down.  Noclinicalsigns of progression of disease Patient is being followed at [facility] by Dr. Baltazar Najjar.  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  Orthopedics is done x-rays which did not show any fractures.  He may need further work-up.  Neuropathy: Patient is seeing a neurologist to do a nerve conduction study. I will reach out to Dr. Baltazar Najjar to evaluate the significance of rising kappa light chain.  I reviewed the MRI of his neck which did not show any evidence of myeloma.  Return to clinic in 1 year for follow-up.     Orders Placed This Encounter  Procedures  . CBC with Differential (Cancer Center Only)    Standing Status:   Future    Standing Expiration Date:   04/02/2021  . CMP ([place] only)    Standing Status:   Future    Standing Expiration Date:   04/02/2021  . Kappa/lambda light chains    Standing Status:   Future    Standing Expiration Date:   04/02/2021  . Multiple Myeloma Panel (SPEP&IFE w/QIG)    Standing Status:   Future    Standing Expiration Date:   04/02/2021  . Beta 2 microglobulin, serum    Standing Status:   Future    Standing Expiration Date:   04/02/2021   The patient has a good understanding of the overall plan. he agrees with it. he will call with any problems that may develop before the next visit here.  Total time spent: 20 mins including face to face time and time spent for planning, charting and coordination of care  Travis [name], Travis Dad, MD 04/02/2020  I, Cloyde Reams Dorshimer, am acting as scribe for Dr. Nicholas Lose.  I have reviewed the above documentation for accuracy and completeness, and I agree with the above.

## 2020-04-01 NOTE — Assessment & Plan Note (Signed)
03/21/2019:Kappa268 (was 112:9 months ago), lambda 10.3, ratio 26.07 (it was 15.18), M protein 0.4 g (was 0.6 g)Hemoglobin 14.9, calcium 9.2, creatinine 0.87 03/25/2020: Kappa 623, Lambda 8.9, Ratio: 72, Hb 14, Cr 0.88,  Calcium: 9.5, LDH 265, M Protein 0.2 gm.   Review: I discussed with the patient thatKappais certainly increasing however the M protein has come down.  Noclinicalsigns of progression of disease Patient is being followed at [facility] by Dr. Baltazar Najjar.  Severe joint discomfort: Follows rheumatology    Return to clinic in 1 year for follow-up.  He will move his [facility] appointment to 6 months from now.

## 2020-04-02 ENCOUNTER — Other Ambulatory Visit: Payer: Self-pay

## 2020-04-02 ENCOUNTER — Inpatient Hospital Stay: Payer: Medicare Other | Attending: Oncology | Admitting: Hematology and Oncology

## 2020-04-02 DIAGNOSIS — M25551 Pain in right hip: Secondary | ICD-10-CM | POA: Diagnosis not present

## 2020-04-02 DIAGNOSIS — Z7901 Long term (current) use of anticoagulants: Secondary | ICD-10-CM | POA: Diagnosis not present

## 2020-04-02 DIAGNOSIS — C9000 Multiple myeloma not having achieved remission: Secondary | ICD-10-CM

## 2020-04-02 DIAGNOSIS — Z7952 Long term (current) use of systemic steroids: Secondary | ICD-10-CM | POA: Insufficient documentation

## 2020-04-02 DIAGNOSIS — G629 Polyneuropathy, unspecified: Secondary | ICD-10-CM | POA: Insufficient documentation

## 2020-04-02 DIAGNOSIS — D472 Monoclonal gammopathy: Secondary | ICD-10-CM

## 2020-04-02 DIAGNOSIS — Z79899 Other long term (current) drug therapy: Secondary | ICD-10-CM | POA: Diagnosis not present

## 2020-04-04 ENCOUNTER — Ambulatory Visit: Payer: Medicare Other | Admitting: Neurology

## 2020-04-04 ENCOUNTER — Other Ambulatory Visit: Payer: Self-pay

## 2020-04-04 DIAGNOSIS — M5412 Radiculopathy, cervical region: Secondary | ICD-10-CM

## 2020-04-04 DIAGNOSIS — G5601 Carpal tunnel syndrome, right upper limb: Secondary | ICD-10-CM

## 2020-04-04 NOTE — Procedures (Signed)
[facility] Neurology  [place], [place]  [place], [place] [address] Tel: [phone] Fax:  [phone] Test Date:  04/04/2020  Patient: Travis [name] DOB: [birth year] Physician: Narda Amber, DO  Sex: Male Height: 5\' 10"  Ref Phys: Narda Amber, DO  ID#: [id number]   Technician:    Patient Complaints: This is a 69 year old man referred for evaluation of bilateral arm paresthesias  NCV & EMG Findings: Extensive electrodiagnostic testing of the right upper extremity and additional studies of the left shows:  1. Right median sensory response shows reduced amplitude (8.9 V).  Right mixed palmar sensory responses show prolonged latency.  Left median, mixed palmar sensory responses, and bilateral ulnar sensory responses are within normal limits  2. Bilateral median and ulnar motor responses are within normal limits.   3. Chronic motor axonal loss changes are seen affecting the deltoid and biceps muscles, without accompanied active denervation   Impression: 1. Chronic C5 radiculopathy affecting bilateral upper extremities, mild. 2. 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     ___________________________ Narda Amber, DO    Nerve Conduction Studies Anti Sensory Summary Table   Stim Site NR Peak (ms) Norm Peak (ms) P-T Amp (V) Norm P-T Amp  Left Median Anti Sensory (2nd Digit)  32C  Wrist    3.2 <3.8 17.5 >10  Right Median Anti Sensory (2nd Digit)  32C  Wrist    3.6 <3.8 8.9 >10  Left Ulnar Anti Sensory (5th Digit)  32C  Wrist    3.2 <3.2 14.8 >5  Right Ulnar Anti Sensory (5th Digit)  32C  Wrist    2.9 <3.2 15.6 >5   Motor Summary Table   Stim Site NR Onset (ms) Norm Onset (ms) O-P Amp (mV) Norm O-P Amp Site1 Site2 Delta-0 (ms) Dist (cm) Vel (m/s) Norm Vel (m/s)  Left Median Motor (Abd Poll Brev)  32C  Wrist    3.6 <4.0 7.6 >5 Elbow Wrist 5.9 31.0 53 >50  Elbow    9.5  7.2          Right Median Motor (Abd Poll Brev)  32C  Wrist    3.2 <4.0 9.5 >5 Elbow Wrist 5.6 29.0 52 >50  Elbow    8.8  8.5         Left Ulnar Motor (Abd Dig Minimi)  32C  Wrist    2.7 <3.1 10.8 >7 B Elbow Wrist 4.3 24.0 56 >50  B Elbow    7.0  10.0  A Elbow B Elbow 1.9 10.0 53 >50  A Elbow    8.9  9.2         Right Ulnar Motor (Abd Dig Minimi)  32C  Wrist    2.4 <3.1 9.7 >7 B Elbow Wrist 4.3 25.0 58 >50  B Elbow    6.7  9.5  A Elbow B Elbow 1.8 10.0 56 >50  A Elbow    8.5  9.0          Comparison Summary Table   Stim Site NR Peak (ms) Norm Peak (ms) P-T Amp (V) Site1 Site2 Delta-P (ms) Norm Delta (ms)  Left Median/Ulnar Palm Comparison (Wrist - 8cm)  32C  Median Palm    2.2 <2.2 28.2 Median Palm Ulnar Palm 0.3   Ulnar Palm    1.9 <2.2 17.4      Right Median/Ulnar Palm Comparison (Wrist - 8cm)  32C  Median [employer]  2.2 <2.2 27.2 Median Palm Ulnar Palm 0.6   Ulnar Palm    1.6 <2.2 11.3       EMG   Side Muscle Ins Act Fibs Psw Fasc Number Recrt Dur Dur. Amp Amp. Poly Poly. Comment  Left 1stDorInt Nml Nml Nml Nml Nml Nml Nml Nml Nml Nml Nml Nml N/A  Left PronatorTeres Nml Nml Nml Nml Nml Nml Nml Nml Nml Nml Nml Nml N/A  Left Biceps Nml Nml Nml Nml 1- Rapid Few 1+ Few 2-.3- Few 1+ N/A  Left Triceps Nml Nml Nml Nml Nml Nml Nml Nml Nml Nml Nml Nml N/A  Left Deltoid Nml Nml Nml Nml 1- Rapid Some 1+ Some 1+ Some 1+ N/A  Right 1stDorInt Nml Nml Nml Nml Nml Nml Nml Nml Nml Nml Nml Nml N/A  Right Abd Poll Brev Nml Nml Nml Nml Nml Nml Nml Nml Nml Nml Nml Nml N/A  Right PronatorTeres Nml Nml Nml Nml Nml Nml Nml Nml Nml Nml Nml Nml N/A  Right Biceps Nml Nml Nml Nml 1- Rapid Few 1+ Few 1+ Few 1+ N/A  Right Triceps Nml Nml Nml Nml Nml Nml Nml Nml Nml Nml Nml Nml N/A  Right Deltoid Nml Nml Nml Nml 1- Rapid Some 1+ Some 1+ Some 1+ N/A      Waveforms:

## 2020-04-09 ENCOUNTER — Other Ambulatory Visit: Payer: Self-pay | Admitting: Orthopedic Surgery

## 2020-04-09 DIAGNOSIS — M5416 Radiculopathy, lumbar region: Secondary | ICD-10-CM

## 2020-04-23 ENCOUNTER — Ambulatory Visit
Admission: RE | Admit: 2020-04-23 | Discharge: 2020-04-23 | Disposition: A | Discharge status: Home / Self Care | Payer: Medicare Other | Source: Ambulatory Visit | Attending: Orthopedic Surgery | Admitting: Orthopedic Surgery

## 2020-04-23 ENCOUNTER — Other Ambulatory Visit: Payer: Self-pay

## 2020-04-23 DIAGNOSIS — M5416 Radiculopathy, lumbar region: Secondary | ICD-10-CM

## 2020-04-28 ENCOUNTER — Encounter: Payer: Self-pay | Admitting: *Deleted

## 2020-04-28 NOTE — Progress Notes (Signed)
Received call from Junie Panning, [place] for Dr. Baltazar Najjar with [facility] stating Dr. Baltazar Najjar has suggest pt undergo bone marrow biopsy at our cancer center for further evaluation of rising kappa light chain.  States patient is needing flow cytometry, pathology, multiple myeloma by FISH, and chromosome analysis with the bone marrow biopsy.  RN will review with MD and schedule bone marrow biopsy per MD instructions.

## 2020-04-29 ENCOUNTER — Telehealth: Payer: Self-pay | Admitting: Hematology and Oncology

## 2020-04-29 NOTE — Telephone Encounter (Signed)
Scheduled follow-up per 12/28 schedule message. Patient is aware.

## 2020-04-30 ENCOUNTER — Other Ambulatory Visit: Payer: Medicare Other

## 2020-05-05 ENCOUNTER — Inpatient Hospital Stay: Payer: Medicare Other

## 2020-05-05 ENCOUNTER — Other Ambulatory Visit: Payer: Self-pay

## 2020-05-05 ENCOUNTER — Inpatient Hospital Stay: Payer: Medicare Other | Attending: Oncology | Admitting: Adult Health

## 2020-05-05 VITALS — BP 127/66 | HR 57 | Temp 98.1°F | Resp 18 | Ht 70.0 in

## 2020-05-05 DIAGNOSIS — Z7901 Long term (current) use of anticoagulants: Secondary | ICD-10-CM | POA: Diagnosis not present

## 2020-05-05 DIAGNOSIS — Z79899 Other long term (current) drug therapy: Secondary | ICD-10-CM | POA: Insufficient documentation

## 2020-05-05 DIAGNOSIS — M25551 Pain in right hip: Secondary | ICD-10-CM | POA: Insufficient documentation

## 2020-05-05 DIAGNOSIS — C9000 Multiple myeloma not having achieved remission: Secondary | ICD-10-CM | POA: Diagnosis not present

## 2020-05-05 DIAGNOSIS — D472 Monoclonal gammopathy: Secondary | ICD-10-CM

## 2020-05-05 DIAGNOSIS — Z7952 Long term (current) use of systemic steroids: Secondary | ICD-10-CM | POA: Insufficient documentation

## 2020-05-05 DIAGNOSIS — D72828 Other elevated white blood cell count: Secondary | ICD-10-CM | POA: Diagnosis not present

## 2020-05-05 DIAGNOSIS — E8581 Light chain (AL) amyloidosis: Secondary | ICD-10-CM | POA: Insufficient documentation

## 2020-05-05 DIAGNOSIS — Z96649 Presence of unspecified artificial hip joint: Secondary | ICD-10-CM | POA: Insufficient documentation

## 2020-05-05 DIAGNOSIS — D47Z9 Other specified neoplasms of uncertain behavior of lymphoid, hematopoietic and related tissue: Secondary | ICD-10-CM | POA: Diagnosis not present

## 2020-05-05 LAB — CBC WITH DIFFERENTIAL (CANCER CENTER ONLY)
Abs Immature Granulocytes: 0.03 10*3/uL (ref 0.00–0.07)
Basophils Absolute: 0 10*3/uL (ref 0.0–0.1)
Basophils Relative: 0 %
Eosinophils Absolute: 0.1 10*3/uL (ref 0.0–0.5)
Eosinophils Relative: 1 %
HCT: 40.6 % (ref 39.0–52.0)
Hemoglobin: 14 g/dL (ref 13.0–17.0)
Immature Granulocytes: 0 %
Lymphocytes Relative: 12 %
Lymphs Abs: 1.2 10*3/uL (ref 0.7–4.0)
MCH: 31.3 pg (ref 26.0–34.0)
MCHC: 34.5 g/dL (ref 30.0–36.0)
MCV: 90.6 fL (ref 80.0–100.0)
Monocytes Absolute: 0.9 10*3/uL (ref 0.1–1.0)
Monocytes Relative: 9 %
Neutro Abs: 7.9 10*3/uL — ABNORMAL HIGH (ref 1.7–7.7)
Neutrophils Relative %: 78 %
Platelet Count: 272 10*3/uL (ref 150–400)
RBC: 4.48 MIL/uL (ref 4.22–5.81)
RDW: 13.3 % (ref 11.5–15.5)
WBC Count: 10 10*3/uL (ref 4.0–10.5)
nRBC: 0 % (ref 0.0–0.2)

## 2020-05-05 LAB — CMP (CANCER CENTER ONLY)
ALT: 25 U/L (ref 0–44)
AST: 19 U/L (ref 15–41)
Albumin: 3.5 g/dL (ref 3.5–5.0)
Alkaline Phosphatase: 92 U/L (ref 38–126)
Anion gap: 7 (ref 5–15)
BUN: 14 mg/dL (ref 8–23)
CO2: 30 mmol/L (ref 22–32)
Calcium: 9.6 mg/dL (ref 8.9–10.3)
Chloride: 104 mmol/L (ref 98–111)
Creatinine: 0.76 mg/dL (ref 0.61–1.24)
GFR, Estimated: >60 mL/min (ref >60)
Glucose, Bld: 106 mg/dL — ABNORMAL HIGH (ref 70–99)
Potassium: 3.3 mmol/L — ABNORMAL LOW (ref 3.5–5.1)
Sodium: 141 mmol/L (ref 135–145)
Total Bilirubin: 0.5 mg/dL (ref 0.3–1.2)
Total Protein: 7 g/dL (ref 6.5–8.1)

## 2020-05-05 MED ORDER — LIDOCAINE HCL 2 % IJ SOLN
INTRAMUSCULAR | Status: AC
  Filled 2020-05-05: qty 20

## 2020-05-05 NOTE — Progress Notes (Signed)
INDICATION: multiple myeloma  Brief examination was performed. ENT: adequate airway clearance Heart: regular rate and rhythm.No Murmurs Lungs: clear to auscultation, no wheezes, normal respiratory effort  Bone Marrow Biopsy and Aspiration Procedure Note   Informed consent was obtained and potential risks including bleeding, infection and pain were reviewed with the patient.  The patient's name, date of birth, identification, consent and allergies were verified prior to the start of procedure and time out was performed.  The left posterior iliac crest was chosen as the site of biopsy.  The skin was prepped with ChloraPrep.   8 cc of 2% lidocaine was used to provide local anaesthesia.   10 cc of bone marrow aspirate was obtained followed by 1cm biopsy.  Pressure was applied to the biopsy site and bandage was placed over the biopsy site. Patient was made to lie on the back for 30 mins prior to discharge.  The procedure was tolerated well. COMPLICATIONS: None BLOOD LOSS: none The patient was discharged home in stable condition with a 1 week follow up to review results.  Patient was provided with post bone marrow biopsy instructions and instructed to call if there was any bleeding or worsening pain.  Specimens sent for flow cytometry, cytogenetics and additional studies.  Signed Scot Dock, NP

## 2020-05-05 NOTE — Progress Notes (Signed)
Pt is 30 min. post bone marrow biopsy. Vital signs were stable, biopsy site dressing was clean, dry, and intact. Pt was given AVS and instructed to monitor site per AVS. All questions answered and pt was ambulatory to lobby.

## 2020-05-05 NOTE — Patient Instructions (Signed)
Bone Marrow Aspiration and Bone Marrow Biopsy, Adult, Care After This sheet gives you information about how to care for yourself after your procedure. Your health care provider may also give you more specific instructions. If you have problems or questions, contact your health care provider. What can I expect after the procedure? After the procedure, it is common to have:  Mild pain and tenderness.  Swelling.  Bruising. Follow these instructions at home: Puncture site care   Follow instructions from your health care provider about how to take care of the puncture site. Make sure you: ? Wash your hands with soap and water before and after you change your bandage (dressing). If soap and water are not available, use hand sanitizer. ? Change your dressing as told by your health care provider.  Check your puncture site every day for signs of infection. Check for: ? More redness, swelling, or pain. ? Fluid or blood. ? Warmth. ? Pus or a bad smell. Activity  Return to your normal activities as told by your health care provider. Ask your health care provider what activities are safe for you.  Do not lift anything that is heavier than 10 lb (4.5 kg), or the limit that you are told, until your health care provider says that it is safe.  Do not drive for 24 hours if you were given a sedative during your procedure. General instructions   Take over-the-counter and prescription medicines only as told by your health care provider.  Do not take baths, swim, or use a hot tub until your health care provider approves. Ask your health care provider if you may take showers. You may only be allowed to take sponge baths.  If directed, put ice on the affected area. To do this: ? Put ice in a plastic bag. ? Place a towel between your skin and the bag. ? Leave the ice on for 20 minutes, 2-3 times a day.  Keep all follow-up visits as told by your health care provider. This is important. Contact a  health care provider if:  Your pain is not controlled with medicine.  You have a fever.  You have more redness, swelling, or pain around the puncture site.  You have fluid or blood coming from the puncture site.  Your puncture site feels warm to the touch.  You have pus or a bad smell coming from the puncture site. Summary  After the procedure, it is common to have mild pain, tenderness, swelling, and bruising.  Follow instructions from your health care provider about how to take care of the puncture site and what activities are safe for you.  Take over-the-counter and prescription medicines only as told by your health care provider.  Contact a health care provider if you have any signs of infection, such as fluid or blood coming from the puncture site. This information is not intended to replace advice given to you by your health care provider. Make sure you discuss any questions you have with your health care provider. Document Revised: 09/05/2018 Document Reviewed: 09/05/2018 Elsevier Patient Education  2020 [employer]  

## 2020-05-06 LAB — KAPPA/LAMBDA LIGHT CHAINS
Kappa free light chain: 598 mg/L — ABNORMAL HIGH (ref 3.3–19.4)
Kappa, lambda light chain ratio: 72.93 — ABNORMAL HIGH (ref 0.26–1.65)
Lambda free light chains: 8.2 mg/L (ref 5.7–26.3)

## 2020-05-07 LAB — MULTIPLE MYELOMA PANEL, SERUM
Albumin SerPl Elph-Mcnc: 3.4 g/dL (ref 2.9–4.4)
Albumin/Glob SerPl: 1.3 (ref 0.7–1.7)
Alpha 1: 0.2 g/dL (ref 0.0–0.4)
Alpha2 Glob SerPl Elph-Mcnc: 0.8 g/dL (ref 0.4–1.0)
B-Globulin SerPl Elph-Mcnc: 0.9 g/dL (ref 0.7–1.3)
Gamma Glob SerPl Elph-Mcnc: 0.9 g/dL (ref 0.4–1.8)
Globulin, Total: 2.8 g/dL (ref 2.2–3.9)
IgA: 441 mg/dL — ABNORMAL HIGH (ref 61–437)
IgG (Immunoglobin G), Serum: 587 mg/dL — ABNORMAL LOW (ref 603–1613)
IgM (Immunoglobulin M), Srm: 29 mg/dL (ref 20–172)
M Protein SerPl Elph-Mcnc: 0.3 g/dL — ABNORMAL HIGH
Total Protein ELP: 6.2 g/dL (ref 6.0–8.5)

## 2020-05-07 LAB — SURGICAL PATHOLOGY

## 2020-05-10 DIAGNOSIS — M79645 Pain in left finger(s): Secondary | ICD-10-CM | POA: Diagnosis not present

## 2020-05-13 ENCOUNTER — Encounter (HOSPITAL_COMMUNITY): Payer: Self-pay | Admitting: Hematology and Oncology

## 2020-05-14 NOTE — Progress Notes (Signed)
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 with above-mentioned history of smoldering myeloma. He presents to the clinic todayforfollow-up.    He underwent a bone marrow biopsy because of his kappa light chain increasing steadily over time.  His monoclonal protein has not been a significant concern and his immunoglobulin levels are pretty steady.  He is undergoing lots of pain with his right hip.  He had prior hip replacement surgery but they are not sure why he has so much pain and discomfort.  ALLERGIES:  has No Known Allergies.  MEDICATIONS:  Current Outpatient Medications  Medication Sig Dispense Refill   acetaminophen (TYLENOL 8 HOUR ARTHRITIS PAIN) 650 MG CR tablet Take 1,300 mg by mouth every 8 (eight) hours as  needed.      amLODipine (NORVASC) 10 MG tablet Take 10 mg by mouth daily.      apixaban (ELIQUIS) 5 MG TABS tablet Take 1 tablet (5 mg total) by mouth 2 (two) times daily. 60 tablet 10   b complex vitamins tablet Take 1 tablet by mouth daily.     cyanocobalamin (,VITAMIN B-12,) 1000 MCG/ML injection 1000 mcg into the muscle monthly. Please give box of 3 ml syringes and box of 1/2" 21g needles     diltiazem (CARDIZEM) 30 MG tablet Take 1 Tablet Every 4 Hours As Needed For Afib HR Over 100. If using frequently please call our office [phone] 30 tablet 1   flecainide (TAMBOCOR) 50 MG tablet Take 1.5 tablets (75 mg total) by mouth 2 (two) times daily. 90 tablet 5   fluticasone (FLONASE) 50 MCG/ACT nasal spray Place into both nostrils as needed for allergies or rhinitis.     folic acid (FOLVITE) 1 MG tablet Take 1 mg by mouth daily.     gabapentin (NEURONTIN) 300 MG capsule TAKE TWO CAPSULES BY MOUTH TWICE A DAY 180 capsule 3   hydrochlorothiazide (HYDRODIURIL) 25 MG tablet Take 1 tablet (25 mg total) by mouth daily. 90 tablet 2   lisinopril (ZESTRIL) 40 MG tablet Take 1 tablet (40 mg total) by mouth daily. 90 tablet 3   methotrexate 2.5 MG tablet Take by mouth once a week.  (Patient not taking: Reported on 02/07/2020)     Methotrexate Sodium (METHOTREXATE, PF,) 50 MG/2ML injection Inject into the muscle.  metoprolol succinate (TOPROL-XL) 100 MG 24 hr tablet Take 100 mg by mouth daily.      oxymetazoline (AFRIN) 0.05 % nasal spray Place 1 spray into both nostrils 2 (two) times daily.     potassium chloride SA (KLOR-CON) 20 MEQ tablet Take 1 tablet (20 mEq total) by mouth daily. 90 tablet 2   predniSONE (DELTASONE) 5 MG tablet Take 5 mg by mouth as needed.     psyllium (METAMUCIL) 58.6 % powder Take 1 packet by mouth every evening.      sodium chloride (OCEAN) 0.65 % SOLN nasal spray Place 1 spray into both nostrils as needed for congestion.     triamcinolone cream (KENALOG)  0.1 % Apply topically as needed.      Vitamin D, Ergocalciferol, (DRISDOL) 1.25 MG (50000 UT) CAPS capsule TAKE ONE CAPSULE BY MOUTH ONCE WEEKLY 4 capsule 3   No current facility-administered medications for this visit.    PHYSICAL EXAMINATION: ECOG PERFORMANCE STATUS: 2 - Symptomatic, <50% confined to bed  Vitals:   05/15/20 1604  BP: (!) 152/59  Pulse: 61  Resp: 17  Temp: 98.1 F (36.7 C)  SpO2: 96%   Filed Weights   05/15/20 1604  Weight: 271 lb 6.4 oz (123.1 kg)    LABORATORY DATA:  I have reviewed the data as listed CMP Latest Ref Rng & Units 05/05/2020 03/24/2020 12/04/2019  Glucose 70 - 99 mg/dL 106(H) 111(H) 131(H)  BUN 8 - 23 mg/dL _0 Creatinine 0.61 - 1.24 mg/dL 0.76 0.88 0.76  Sodium 135 - 145 mmol/L 141 138 140  Potassium 3.5 - 5.1 mmol/L 3.3(L) 3.7 4.1  Chloride 98 - 111 mmol/L 104 102 101  CO2 22 - 32 mmol/L _1 Calcium 8.9 - 10.3 mg/dL 9.6 9.5 9.4  Total Protein 6.5 - 8.1 g/dL 7.0 7.2 -  Total Bilirubin 0.3 - 1.2 mg/dL 0.5 0.5 -  Alkaline Phos 38 - 126 U/L 92 100 -  AST 15 - 41 U/L 19 19 -  ALT 0 - 44 U/L 25 24 -    Lab Results  Component Value Date   WBC 10.0 05/05/2020   HGB 14.0 05/05/2020   HCT 40.6 05/05/2020   MCV 90.6 05/05/2020   PLT 272 05/05/2020   NEUTROABS 7.9 (H) 05/05/2020    ASSESSMENT & PLAN:  Smoldering multiple myeloma ([place]) 03/21/2019:Kappa268 (was 112:9 months ago), lambda 10.3, ratio 26.07 (it was 15.18), M protein 0.4 g (was 0.6 g)Hemoglobin 14.9, calcium 9.2, creatinine 0.87 03/25/2020: Kappa 623, Lambda 8.9, Ratio: 72, Hb 14, Cr 0.88,  Calcium: 9.5, LDH 265, M Protein 0.2 gm.  05/05/2020: Hemoglobin 14, Kappa 598, lambda 8.2, ratio 72.9, creatinine 0.76, IgG 587, IgA 441, M protein 0.3 g  Bone marrow biopsy 05/05/2020: Hypercellular bone marrow (50%) for age with plasma cells 14% display kappa light chain restriction, FISH pending, cytogenetics: Normal 12 XY  I discussed with the patient that greater than 10%  plasma cells to indicate smoldering myeloma.  I do not believe that there are any signs or symptoms of myeloma. Patient will continue to follow with Dr. Baltazar Najjar. I will see him back in 1 year for follow-up   Orders Placed This Encounter  Procedures   CBC with Differential ([place] Only)    Standing Status:   Future    Standing Expiration Date:   05/15/2021   CMP ([place] only)    Standing Status:   Future    Standing Expiration Date:  05/15/2021   Kappa/lambda light chains    Standing Status:   Future    Standing Expiration Date:   05/15/2021   Multiple Myeloma Panel (SPEP&IFE w/QIG)    Standing Status:   Future    Standing Expiration Date:   05/15/2021   Beta 2 microglobulin, serum    Standing Status:   Future    Standing Expiration Date:   05/15/2021   The patient has a good understanding of the overall plan. he agrees with it. he will call with any problems that may develop before the next visit here.  Total time spent: 30 mins including face to face time and time spent for planning, charting and coordination of care  Travis Lose, MD 05/15/2020  I, Cloyde Reams Dorshimer, am acting as scribe for Dr. Nicholas [name].  I have reviewed the above documentation for accuracy and completeness, and I agree with the above.

## 2020-05-15 ENCOUNTER — Encounter (HOSPITAL_COMMUNITY): Payer: Self-pay | Admitting: Hematology and Oncology

## 2020-05-15 ENCOUNTER — Inpatient Hospital Stay (HOSPITAL_BASED_OUTPATIENT_CLINIC_OR_DEPARTMENT_OTHER): Payer: Medicare Other | Admitting: Hematology and Oncology

## 2020-05-15 ENCOUNTER — Other Ambulatory Visit: Payer: Self-pay

## 2020-05-15 DIAGNOSIS — C9000 Multiple myeloma not having achieved remission: Secondary | ICD-10-CM | POA: Diagnosis not present

## 2020-05-15 DIAGNOSIS — D472 Monoclonal gammopathy: Secondary | ICD-10-CM | POA: Diagnosis not present

## 2020-05-15 DIAGNOSIS — E8581 Light chain (AL) amyloidosis: Secondary | ICD-10-CM | POA: Diagnosis not present

## 2020-05-15 DIAGNOSIS — Z7901 Long term (current) use of anticoagulants: Secondary | ICD-10-CM | POA: Diagnosis not present

## 2020-05-15 DIAGNOSIS — Z79899 Other long term (current) drug therapy: Secondary | ICD-10-CM | POA: Diagnosis not present

## 2020-05-15 DIAGNOSIS — M25551 Pain in right hip: Secondary | ICD-10-CM | POA: Diagnosis not present

## 2020-05-15 DIAGNOSIS — Z7952 Long term (current) use of systemic steroids: Secondary | ICD-10-CM | POA: Diagnosis not present

## 2020-05-15 NOTE — Assessment & Plan Note (Signed)
03/21/2019:Kappa268 (was 112:9 months ago), lambda 10.3, ratio 26.07 (it was 15.18), M protein 0.4 g (was 0.6 g)Hemoglobin 14.9, calcium 9.2, creatinine 0.87 03/25/2020: Kappa 623, Lambda 8.9, Ratio: 72, Hb 14, Cr 0.88,  Calcium: 9.5, LDH 265, M Protein 0.2 gm.  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  I do not believe that there are any signs or symptoms of myeloma. Patient will continue to follow with Dr. Baltazar Najjar.

## 2020-05-16 DIAGNOSIS — G4733 Obstructive sleep apnea (adult) (pediatric): Secondary | ICD-10-CM | POA: Diagnosis not present

## 2020-05-16 DIAGNOSIS — M79642 Pain in left hand: Secondary | ICD-10-CM | POA: Diagnosis not present

## 2020-05-17 DIAGNOSIS — R29898 Other symptoms and signs involving the musculoskeletal system: Secondary | ICD-10-CM | POA: Diagnosis not present

## 2020-05-17 DIAGNOSIS — Z471 Aftercare following joint replacement surgery: Secondary | ICD-10-CM | POA: Diagnosis not present

## 2020-05-17 DIAGNOSIS — R202 Paresthesia of skin: Secondary | ICD-10-CM | POA: Diagnosis not present

## 2020-05-17 DIAGNOSIS — R200 Anesthesia of skin: Secondary | ICD-10-CM | POA: Diagnosis not present

## 2020-05-17 DIAGNOSIS — M25551 Pain in right hip: Secondary | ICD-10-CM | POA: Diagnosis not present

## 2020-05-19 ENCOUNTER — Encounter: Payer: Medicare Other | Attending: Geriatric Medicine | Admitting: Dietician

## 2020-05-19 DIAGNOSIS — R7303 Prediabetes: Secondary | ICD-10-CM | POA: Diagnosis not present

## 2020-05-19 NOTE — Progress Notes (Signed)
On 05/19/2020 patient completed Core Session 1 of Diabetes Prevention Program course virtually with [facility]. The following learning objectives were met by the patient during this class:   Virtual Visit via Video Note  I connected with Travis [name], DOB:[birth year] by a video enabled application and verified that I am speaking with the correct person using two identifiers.  Location: Patient: Virtual  Provider: Office    Learning Objectives:   Be able to explain the purpose and benefits of the National Diabetes Prevention Program.   Be able to describe the events that will take place at every session.   Know the weight loss and physical activity goals established by the [facility] Diabetes Prevention Program.   Know their own individual weight loss and physical activity goals.   Be able to explain the important effect of self-monitoring on behavior change.   Goals:  . Record food and beverage intake in "Food and Activity Tracker" over the next week.  . E-mail completed "Food and Activity Tracker" to Lifestyle Coach next week before session 2. . Circle the foods or beverages you think are highest in fat and calories in your food tracker. . Read the labels on the food you buy, and consider using measuring cups and spoons to help you calculate the amount you eat. We will talk about measuring in more detail in the coming weeks.   Follow-Up Plan:  Attend Core Session 2 next week.   E-mail completed "Food and Activity Tracker" to Lifestyle Coach next week before class.

## 2020-05-20 DIAGNOSIS — R29898 Other symptoms and signs involving the musculoskeletal system: Secondary | ICD-10-CM | POA: Diagnosis not present

## 2020-05-20 DIAGNOSIS — I10 Essential (primary) hypertension: Secondary | ICD-10-CM | POA: Diagnosis not present

## 2020-05-21 DIAGNOSIS — G4733 Obstructive sleep apnea (adult) (pediatric): Secondary | ICD-10-CM | POA: Diagnosis not present

## 2020-05-23 DIAGNOSIS — M0609 Rheumatoid arthritis without rheumatoid factor, multiple sites: Secondary | ICD-10-CM | POA: Diagnosis not present

## 2020-05-26 ENCOUNTER — Encounter (HOSPITAL_BASED_OUTPATIENT_CLINIC_OR_DEPARTMENT_OTHER): Payer: Medicare Other | Admitting: Dietician

## 2020-05-26 DIAGNOSIS — R7303 Prediabetes: Secondary | ICD-10-CM | POA: Diagnosis not present

## 2020-05-26 NOTE — Progress Notes (Signed)
On 05/26/2020 patient completed Core Session 2 of Diabetes Prevention Program course virtually with [facility]. The following learning objectives were met by the patient during this class:   I connected with Satira Anis. Miner, 1951/01/08 by a video enabled application and verified that I am speaking with the correct person using two identifiers.  Location: Patient: Virtual Provider: Office  Learning Objectives:  Self-monitor their weight during the weeks following Session 2.   Describe the relationship between fat and calories.   Explain the reason for, and basic principles of, self-monitoring fat grams and calories.   Identify their personal fat gram goals.   Use the ?Fat and Calorie Counter to calculate the calories and fat grams of a given selection of foods.   Keep a running total of the fat grams they eat each day.   Calculate fat, calories, and serving sizes from nutrition labels.   Goals:   Weigh yourself at the same time each day, or every few days, and record your weight in your Food and Activity Tracker.  Write down everything you eat and drink in your Food and Activity Tracker.  Measure portions as much as you can, and start reading labels.   Use the ?Fat and Calorie Counter to figure out the amount of fat and calories in what you ate, and write the amount down in your Food and Activity Tracker.  Keep a running fat gram total throughout the day. Come as close to your fat gram goal as you can.   Follow-Up Plan:  Attend Core Session 3 next week.   Email completed  "Food and Activity Tracker" to Lifestyle Coach next week.

## 2020-05-29 ENCOUNTER — Encounter: Payer: Self-pay | Admitting: *Deleted

## 2020-05-29 DIAGNOSIS — C9000 Multiple myeloma not having achieved remission: Secondary | ICD-10-CM | POA: Diagnosis not present

## 2020-05-29 NOTE — Progress Notes (Signed)
Received call from [facility] [place] [phone]).  Requesting pt recent bone marrow biopsy results be faxed directly to her at [phone].  RN successfully faxed results.

## 2020-06-02 ENCOUNTER — Encounter (HOSPITAL_BASED_OUTPATIENT_CLINIC_OR_DEPARTMENT_OTHER): Payer: Medicare Other | Admitting: Dietician

## 2020-06-02 DIAGNOSIS — R7303 Prediabetes: Secondary | ICD-10-CM | POA: Diagnosis not present

## 2020-06-02 NOTE — Progress Notes (Signed)
On 06/02/2020 patient completed Core Session 3 of Diabetes Prevention Program course virtually with [facility]. The following learning objectives were met by the patient during this class:   I connected with Travis [name], 1950/12/08 by a video enabled application and verified that I am speaking with the correct person using two identifiers.  Location: Patient: Virtual Provider: Office  Learning Objectives:  Weigh and measure foods.  Estimate the fat and calorie content of common foods.  Describe three ways to eat less fat and fewer calories.  Create a plan to eat less fat for the following week.   Goals:   Track weight when weighing outside of class.   Track food and beverages eaten each day in Food and Activity Tracker and include fat grams and calories for each.   Try to stay within fat gram goal.   Complete plan for eating less high fat foods and answer related homework questions.    Follow-Up Plan:  Attend Core Session 4 next week.   Bring completed "Food and Activity Tracker" next week to be reviewed by Lifestyle Coach.

## 2020-06-05 DIAGNOSIS — D229 Melanocytic nevi, unspecified: Secondary | ICD-10-CM | POA: Diagnosis not present

## 2020-06-05 DIAGNOSIS — D1801 Hemangioma of skin and subcutaneous tissue: Secondary | ICD-10-CM | POA: Diagnosis not present

## 2020-06-05 DIAGNOSIS — L905 Scar conditions and fibrosis of skin: Secondary | ICD-10-CM | POA: Diagnosis not present

## 2020-06-05 DIAGNOSIS — L570 Actinic keratosis: Secondary | ICD-10-CM | POA: Diagnosis not present

## 2020-06-05 DIAGNOSIS — Z85820 Personal history of malignant melanoma of skin: Secondary | ICD-10-CM | POA: Diagnosis not present

## 2020-06-09 ENCOUNTER — Encounter: Payer: Medicare Other | Attending: Geriatric Medicine | Admitting: Dietician

## 2020-06-09 DIAGNOSIS — R7303 Prediabetes: Secondary | ICD-10-CM | POA: Insufficient documentation

## 2020-06-09 NOTE — Progress Notes (Signed)
On 06/09/2020 patient completed Core Session 4 of Diabetes Prevention Program course virtually with [facility]. The following learning objectives were met by the patient during this class:   I connected with Travis [name], 1950-11-11 by a video enabled application and verified that I am speaking with the correct person using two identifiers.  Location: Patient: Virtual Provider: Office  Learning Objectives:  Describe the MyPlate food guide and its recommendations, including how to reduce fat and calories in our diet.  Compare and contrast MyPlate guidelines with participants' eating habits.  List ways to replace high-fat and high-calorie foods with low-fat and low-calorie foods.  Explain the importance of eating plenty of whole grains, vegetables, and fruits, while staying within fat gram goals.  Explain the importance of eating foods from all groups of MyPlate and of eating a variety of foods from within each group.  Explain why a balanced diet is beneficial to health.  Goals:   Record weight taken outside of class.   Track foods and beverages eaten each day in the "Food and Activity Tracker," including calories and fat grams for each item.   Practice comparing what you eat with the recommendations of MyPlate using the "Rate Your Plate" handout.   Complete the "Rate Your Plate" handout form on at least 3 days.   Answer homework questions.   Follow-Up Plan:  Attend Core Session 5 next week.   Email completed "Food and Activity Tracker" next week to be reviewed by Lifestyle Coach.

## 2020-06-10 DIAGNOSIS — M65322 Trigger finger, left index finger: Secondary | ICD-10-CM | POA: Diagnosis not present

## 2020-06-16 ENCOUNTER — Other Ambulatory Visit: Payer: Self-pay

## 2020-06-16 ENCOUNTER — Encounter (HOSPITAL_BASED_OUTPATIENT_CLINIC_OR_DEPARTMENT_OTHER): Payer: Medicare Other | Admitting: Dietician

## 2020-06-16 DIAGNOSIS — G4733 Obstructive sleep apnea (adult) (pediatric): Secondary | ICD-10-CM | POA: Diagnosis not present

## 2020-06-16 DIAGNOSIS — R7303 Prediabetes: Secondary | ICD-10-CM | POA: Diagnosis not present

## 2020-06-16 NOTE — Progress Notes (Signed)
On 06/16/2020 patient completed Core Session 5 of Diabetes Prevention Program course virtually with [facility]. The following learning objectives were met by the patient during this class:   I connected with Travis [name], 12-01-1950 by a video enabled application and verified that I am speaking with the correct person using two identifiers.  Location: Patient: Virtual  Provider: Office  Learning Objectives:  Establish a physical activity goal.  Explain the importance of the physical activity goal.  Describe their current level of physical activity.  Name ways that they are already physically active.  Develop personal plans for physical activity for the next week.   Goals:   Record weight taken outside of class.   Track foods and beverages eaten each day in the "Food and Activity Tracker," including calories and fat grams for each item.   Make an Activity Plan including date, specific type of activity, and length of time you plan to be active that includes at last 60 minutes of activity for the week.   Track activity type, minutes you were active, and distance you reached each day in the "Food and Activity Tracker."   Follow-Up Plan: . Attend Core Session 6 next week.  . E-mail completed "Food and Activity Tracker" to Lifestyle Coach next week before class

## 2020-06-23 ENCOUNTER — Encounter (HOSPITAL_BASED_OUTPATIENT_CLINIC_OR_DEPARTMENT_OTHER): Payer: Medicare Other | Admitting: Dietician

## 2020-06-23 DIAGNOSIS — R7303 Prediabetes: Secondary | ICD-10-CM | POA: Diagnosis not present

## 2020-06-23 NOTE — Progress Notes (Signed)
On 06/23/2020 patient completed Core Session 6 of Diabetes Prevention Program course virtually with [facility]. The following learning objectives were met by the patient during this class:   I connected with Satira Anis. Chmiel 01-06-1951 by a video enabled application and verified that I am speaking with the correct person using two identifiers.  Location: Patient: Virtual  Provider: Office  Learning Objectives:  Graph their daily physical activity.   Describe two ways of finding the time to be active.   Define "lifestyle activity."   Describe how to prevent injury.   Develop an activity plan for the coming week.   Goals:   Record weight taken outside of class.   Track foods and beverages eaten each day in the "Food and Activity Tracker," including calories and fat grams for each item.    Track activity type, minutes you were active, and distance you reached each day in the "Food and Activity Tracker."   Set aside one 20 to 30-minute block of time every day or find two or more periods of 10 to15 minutes each for physical activity.   Warm up, cool down, and stretch.  Make a Physical Activities Plan for the Week.   Follow-Up Plan:  Attend Core Session 7 next week.   E-mail completed "Food and Activity Tracker" to Lifestyle Coach next week before class

## 2020-06-30 ENCOUNTER — Encounter (HOSPITAL_BASED_OUTPATIENT_CLINIC_OR_DEPARTMENT_OTHER): Payer: Medicare Other | Admitting: Dietician

## 2020-06-30 DIAGNOSIS — R7303 Prediabetes: Secondary | ICD-10-CM

## 2020-06-30 NOTE — Progress Notes (Signed)
On 06/30/2020 patient completed Core Session 7 of Diabetes Prevention Program course virtually with [facility]. The following learning objectives were met by the patient during this class:   I connected with Satira Anis. [place] 21-May-1950 by a video enabled application and verified that I am speaking with the correct person using two identifiers.  Location: Patient: Virtual Provider: Office  Learning Objectives:  Define calorie balance.  Explain how healthy eating and being active are related in terms of calorie balance.   Describe the relationship between calorie balance and weight loss.   Describe his or her progress as it relates to calorie balance.   Develop an activity plan for the coming week.   Goals:   Record weight taken outside of class.   Track foods and beverages eaten each day in the "Food and Activity Tracker," including calories and fat grams for each item.    Track activity type, minutes you were active, and distance you reached each day in the "Food and Activity Tracker."   Set aside one 20 to 30-minute block of time every day or find two or more periods of 10 to15 minutes each for physical activity.   Make a Physical Activities Plan for the Week.   Make active lifestyle choices all through the day   Stay at or go slightly over activity goal.   Follow-Up Plan:  Attend Core Session 8 next week.   E-mail completed "Food and Activity Tracker" to Lifestyle Coach next week before class

## 2020-07-04 DIAGNOSIS — Z96641 Presence of right artificial hip joint: Secondary | ICD-10-CM | POA: Diagnosis not present

## 2020-07-04 DIAGNOSIS — M7061 Trochanteric bursitis, right hip: Secondary | ICD-10-CM | POA: Diagnosis not present

## 2020-07-07 ENCOUNTER — Encounter: Payer: Medicare Other | Attending: Geriatric Medicine | Admitting: Dietician

## 2020-07-07 DIAGNOSIS — R7303 Prediabetes: Secondary | ICD-10-CM | POA: Diagnosis not present

## 2020-07-07 NOTE — Progress Notes (Signed)
On 07/07/2020 patient completed Core Session 8 of Diabetes Prevention Program course virtually with [facility]. The following learning objectives were met by the patient during this class:   I connected with Travis [name], 1951/02/22 by a video enabled application and verified that I am speaking with the correct person using two identifiers.  Location: Patient: Virtual   Provider: Office  Learning Objectives:  Recognize positive and negative food and activity cues.   Change negative food and activity cues to positive cues.   Add positive cues for activity and eliminate cues for inactivity.   Develop a plan for removing one problem food cue for the coming week.   Goals:   Record weight taken outside of class.   Track foods and beverages eaten each day in the "Food and Activity Tracker," including calories and fat grams for each item.    Track activity type, minutes you were active, and distance you reached each day in the "Food and Activity Tracker."   Set aside one 20 to 30-minute block of time every day or find two or more periods of 10 to15 minutes each for physical activity.   Remove one problem food cue.   Add one positive cue for being more active.  Follow-Up Plan: . Attend Core Session 9 next week.  . Email completed "Food and Activity Tracker" next week to be reviewed by Lifestyle Coach.

## 2020-07-14 ENCOUNTER — Encounter (HOSPITAL_BASED_OUTPATIENT_CLINIC_OR_DEPARTMENT_OTHER): Payer: Medicare Other | Admitting: Dietician

## 2020-07-14 DIAGNOSIS — R7303 Prediabetes: Secondary | ICD-10-CM

## 2020-07-14 DIAGNOSIS — G4733 Obstructive sleep apnea (adult) (pediatric): Secondary | ICD-10-CM | POA: Diagnosis not present

## 2020-07-14 DIAGNOSIS — I10 Essential (primary) hypertension: Secondary | ICD-10-CM | POA: Diagnosis not present

## 2020-07-14 DIAGNOSIS — M25551 Pain in right hip: Secondary | ICD-10-CM | POA: Diagnosis not present

## 2020-07-14 NOTE — Progress Notes (Signed)
On 07/14/2020 patient completed Core Session 9 of Diabetes Prevention Program course virtually with [facility]. The following learning objectives were met by the patient during this class:   Virtual Visit via Video Note  I connected with Satira Anis. [place], 18-Jan-1951 by a video enabled application and verified that I am speaking with the correct person using two identifiers.  Location: Patient: Virtual Provider: Office  Learning Objectives:  List and describe five steps to problem solving.   Apply the five problem solving steps to resolve a problem he or she has with eating less fat and fewer calories or being more active.   Goals:   Record weight taken outside of class.   Track foods and beverages eaten each day in the "Food and Activity Tracker," including calories and fat grams for each item.    Track activity type, minutes you were active, and distance you reached each day in the "Food and Activity Tracker."   Set aside one 20 to 30-minute block of time every day or find two or more periods of 10 to15 minutes each for physical activity.   Use problem solving action plan created during session to problem solve.   Follow-Up Plan:  Attend Core Session 10 next week.   Email completed "Food and Activity Tracker" next week to be reviewed by Lifestyle Coach.  Email menus from favorite restaurants to next session for future discussion.

## 2020-07-15 DIAGNOSIS — M25551 Pain in right hip: Secondary | ICD-10-CM | POA: Diagnosis not present

## 2020-07-15 DIAGNOSIS — M25651 Stiffness of right hip, not elsewhere classified: Secondary | ICD-10-CM | POA: Diagnosis not present

## 2020-07-17 DIAGNOSIS — M25551 Pain in right hip: Secondary | ICD-10-CM | POA: Diagnosis not present

## 2020-07-17 DIAGNOSIS — M25651 Stiffness of right hip, not elsewhere classified: Secondary | ICD-10-CM | POA: Diagnosis not present

## 2020-07-20 NOTE — Progress Notes (Signed)
Cardiology Office Note   Date:  07/22/2020   ID:  [name], [name] 03-Dec-1950, MRN [id number]  PCP:  Lajean Manes, MD  Cardiologist:   [name], MD    F/U of PAF and HTN   History of Present Illness: Travis [name] is a 70 y.o. male with a history of  HTN, OSA and PAF (CHADSVASc score 4; s/p Afib ablation 2018  On flecanide).   The pt had Cardiac CTA in 2018 which showed no CAD  Calcium score was 0   Pt also has hx of DVT, peripheral neuropathy and MGUS   The pt presents for  f/u   He was seen by Kathleen Argue in July 2021 and T Turner a few days ago     He says that he has noticed some more bouts of PAF   Questions if he should make any changes  He did call in last week   Had home health screen by insurance company (done yearly)  His TBI on R was noted to be low  (0.7)  He was told to mention to cardiology   L TBI normal    The pt denies CP  Breathing is OK    I saw the pt in Sept 2021   Current Meds  Medication Sig  . acetaminophen (TYLENOL) 650 MG CR tablet Take 1,300 mg by mouth every 8 (eight) hours as needed.   [name] amLODipine (NORVASC) 10 MG tablet Take 10 mg by mouth daily.   [name] apixaban (ELIQUIS) 5 MG TABS tablet Take 1 tablet (5 mg total) by mouth 2 (two) times daily.  [name] b complex vitamins tablet Take 1 tablet by mouth daily.  . cyanocobalamin (,VITAMIN B-12,) 1000 MCG/ML injection 1000 mcg into the muscle monthly. Please give box of 3 ml syringes and box of 1/2" 21g needles  . diltiazem (CARDIZEM) 30 MG tablet Take 1 Tablet Every 4 Hours As Needed For Afib HR Over 100. If using frequently please call our office [phone]  . flecainide (TAMBOCOR) 50 MG tablet Take 1.5 tablets (75 mg total) by mouth 2 (two) times daily.  . fluticasone (FLONASE) 50 MCG/ACT nasal spray Place into both nostrils as needed for allergies or rhinitis.  . folic acid (FOLVITE) 1 MG tablet Take 1 mg by mouth daily.  [name] gabapentin (NEURONTIN) 300 MG capsule TAKE TWO CAPSULES BY MOUTH TWICE  A DAY  . hydrochlorothiazide (HYDRODIURIL) 25 MG tablet Take 1 tablet (25 mg total) by mouth daily.  [name] lisinopril (ZESTRIL) 40 MG tablet Take 1 tablet (40 mg total) by mouth daily.  . Methotrexate Sodium (METHOTREXATE, PF,) 50 MG/2ML injection Inject into the muscle.  . metoprolol succinate (TOPROL-XL) 100 MG 24 hr tablet Take 100 mg by mouth daily.   [name] oxymetazoline (AFRIN) 0.05 % nasal spray Place 1 spray into both nostrils 2 (two) times daily.  . potassium chloride SA (KLOR-CON) 20 MEQ tablet Take 1 tablet (20 mEq total) by mouth daily.  . predniSONE (DELTASONE) 5 MG tablet Take 5 mg by mouth as needed.  . psyllium (METAMUCIL) 58.6 % powder Take 1 packet by mouth every evening.   . sodium chloride (OCEAN) 0.65 % SOLN nasal spray Place 1 spray into both nostrils as needed for congestion.  . triamcinolone cream (KENALOG) 0.1 % Apply topically as needed.   . Vitamin D, Ergocalciferol, (DRISDOL) 1.25 MG (50000 UT) CAPS capsule TAKE ONE CAPSULE BY MOUTH ONCE WEEKLY     Allergies:   Patient has  no known allergies.   Past Medical History:  Diagnosis Date  . Arthritis    "comes and goes; mostly in hands, occasionally elbow" (04/05/2017)  . Complication of anesthesia    "just wanted to sleep alot  for couple days S/P VARICOCELE OR" (04/05/2017)  . DVT (deep venous thrombosis) (HCC)    LLE  . Dysrhythmia    PVCs   . GERD (gastroesophageal reflux disease)   . History of hiatal hernia   . Hypertension   . Impaired glucose tolerance   . Myeloma ([place])   . Obesity (BMI 30-39.9) 07/26/2016  . OSA on CPAP 07/26/2016   Mild with AHI 12.5/hr now on CPAP at 14cm H2O  . PAF (paroxysmal atrial fibrillation) ([place])    s/p PVI Ablation in 2018 // Flecainide Rx // Echo 8/21: EF 60-65, no RWMA, mild LVH, normal RV SF, moderate RVE, mild BAE, trivial MR, mild dilation of aortic root (40 mm)  . Pneumonia    "may have had walking pneumonia a few years ago" (04/05/2017)  . Thrombophlebitis     Past  Surgical History:  Procedure Laterality Date  . ATRIAL FIBRILLATION ABLATION  04/05/2017  . ATRIAL FIBRILLATION ABLATION N/A 04/05/2017   Procedure: ATRIAL FIBRILLATION ABLATION;  Surgeon: Thompson Grayer, MD;  Location: [facility];  Service: Cardiovascular;  Laterality: N/A;  . COMPLETE RIGHT HIP REPLACMENT  01/19/2019  . EVALUATION UNDER ANESTHESIA WITH HEMORRHOIDECTOMY N/A 02/24/2017   Procedure: EXAM UNDER ANESTHESIA WITH  HEMORRHOIDECTOMY;  Surgeon: Michael Boston, MD;  Location: [facility];  Service: General;  Laterality: N/A;  . FLEXIBLE SIGMOIDOSCOPY N/A 04/15/2015   Procedure:  UNSEDATED FLEXIBLE SIGMOIDOSCOPY;  Surgeon: [name], MD;  Location: [facility];  Service: Endoscopy;  Laterality: N/A;  . KNEE ARTHROSCOPY Left ~ 2016  . 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 TONSILLECTOMY    . VARICOSE VEIN SURGERY Bilateral      Social History:  The patient  reports that he has never smoked. He has never used smokeless tobacco. He reports current alcohol use of about 14.0 standard drinks of alcohol per week. He reports that he does not use drugs.   Family History:  The patient's family history includes Atrial fibrillation in his father; Dementia in his maternal grandmother and mother; Hypertension in his father and paternal grandfather; Stroke in his father.    ROS:  Please see the history of present illness. All other systems are reviewed and  Negative to the above problem except as noted.    PHYSICAL EXAM: VS:  BP 132/68   Pulse (!) 53   Ht 5\' 10"  (1.778 m)   Wt 262 lb (118.8 kg)   SpO2 94%   BMI 37.59 kg/m   GEN: Morbidly obese 70 yo in no acute distress  HEENT: normal  Neck: no JVD,  Cardiac: RRR; no murmurs No   LE edema  Respiratory:  clear to auscultation bilaterally, GI: soft, nontender, nondistended, + BS  No hepatomegaly  MS: no deformity Moving all extremities   Skin: warm and dry;  Onychomycosis of nails   Neuro:  Deferred  EKG:  EKG is ordered  today.  Sinus bradycardia  55 bpm      Lipid Panel No results found for: CHOL, TRIG, HDL, CHOLHDL, VLDL, LDLCALC, LDLDIRECT    Wt Readings from Last 3 Encounters:  07/22/20 262 lb (118.8 kg)  05/15/20 271 lb 6.4 oz (123.1 kg)  04/02/20 267 lb 1.6 oz (121.2 kg)  ASSESSMENT AND PLAN:  1  Hx of PAF   Doing good on current regimen   Last spell of afib was around [place]     Keep on same mesd    2  HTN  BP is good   Follow   3  OSA  Followed by Ashok Norris Using CPAP     4   Morbid obesity   Discussed diet   Discussed TRE   Pt is following with nutritionist  F/U next fall     Current medicines are reviewed at length with the patient today.  The patient does not have concerns regarding medicines.  Signed, [name], MD  07/22/2020 12:55 PM    [address], [place], [place]  [address] Phone: [phone]; Fax: [phone]

## 2020-07-22 ENCOUNTER — Ambulatory Visit: Payer: Medicare Other | Admitting: Internal Medicine

## 2020-07-22 ENCOUNTER — Encounter: Payer: Self-pay | Admitting: Internal Medicine

## 2020-07-22 ENCOUNTER — Other Ambulatory Visit: Payer: Self-pay

## 2020-07-22 VITALS — BP 132/68 | HR 53 | Ht 70.0 in | Wt 262.0 lb

## 2020-07-22 DIAGNOSIS — I10 Essential (primary) hypertension: Secondary | ICD-10-CM | POA: Diagnosis not present

## 2020-07-22 DIAGNOSIS — I480 Paroxysmal atrial fibrillation: Secondary | ICD-10-CM

## 2020-07-22 NOTE — Patient Instructions (Signed)
Medication Instructions:  The current medical regimen is effective;  continue present plan and medications.  *If you need a refill on your cardiac medications before your next appointment, please call your pharmacy*  Follow-Up: At [facility], you and your health needs are our priority.  As part of our continuing mission to provide you with exceptional heart care, we have created designated Provider Care Teams.  These Care Teams include your primary Cardiologist (physician) and Advanced Practice Providers (APPs -  Physician Assistants and Nurse Practitioners) who all work together to provide you with the care you need, when you need it.  We recommend signing up for the patient portal called "MyChart".  Sign up information is provided on this After Visit Summary.  MyChart is used to connect with patients for Virtual Visits (Telemedicine).  Patients are able to view lab/test results, encounter notes, upcoming appointments, etc.  Non-urgent messages can be sent to your provider as well.   To learn more about what you can do with MyChart, go to [email or web address].    Your next appointment:   9 month(s)  The format for your next appointment:   In Person  Provider:   [name], MD   Thank you for choosing [facility]!!

## 2020-07-25 DIAGNOSIS — M25551 Pain in right hip: Secondary | ICD-10-CM | POA: Diagnosis not present

## 2020-07-28 ENCOUNTER — Other Ambulatory Visit: Payer: Self-pay

## 2020-07-28 ENCOUNTER — Encounter (HOSPITAL_BASED_OUTPATIENT_CLINIC_OR_DEPARTMENT_OTHER): Payer: Medicare Other | Admitting: Dietician

## 2020-07-28 DIAGNOSIS — R7303 Prediabetes: Secondary | ICD-10-CM | POA: Diagnosis not present

## 2020-07-28 DIAGNOSIS — M25551 Pain in right hip: Secondary | ICD-10-CM | POA: Diagnosis not present

## 2020-07-28 NOTE — Progress Notes (Signed)
On 07/28/2020 patient completed Core Session 10 of Diabetes Prevention Program course virtually with [facility]. The following learning objectives were met by the patient during this class:   Virtual Visit via Video Note  I connected with Travis [name], 01-25-1951 by a video enabled application and verified that I am speaking with the correct person using two identifiers.  Location: Patient: Virtual  Provider: Office  Learning Objectives:  List and describe the four keys for healthy eating out.   Give examples of how to apply these keys at the type of restaurants that the participants go to regularly.   Make an appropriate meal selection from a restaurant menu.   Demonstrate how to ask for a substitute item using assertive language and a polite tone of voice.    Goals:   Record weight taken outside of class.   Track foods and beverages eaten each day in the "Food and Activity Tracker," including calories and fat grams for each item.    Track activity type, minutes you were active, and distance you reached each day in the "Food and Activity Tracker."   Set aside one 20 to 30-minute block of time every day or find two or more periods of 10 to15 minutes each for physical activity.   Utilize positive action plan and complete questions on "To Do List."   Follow-Up Plan:  Attend Core Session 11.   Email completed "Food and Activity Tracker" to be reviewed by Lifestyle Coach.

## 2020-07-29 DIAGNOSIS — M79642 Pain in left hand: Secondary | ICD-10-CM | POA: Diagnosis not present

## 2020-07-30 DIAGNOSIS — M25551 Pain in right hip: Secondary | ICD-10-CM | POA: Diagnosis not present

## 2020-08-01 DIAGNOSIS — M79642 Pain in left hand: Secondary | ICD-10-CM | POA: Diagnosis not present

## 2020-08-04 ENCOUNTER — Encounter: Payer: Medicare Other | Attending: Geriatric Medicine | Admitting: Dietician

## 2020-08-04 ENCOUNTER — Other Ambulatory Visit: Payer: Self-pay

## 2020-08-04 DIAGNOSIS — R7303 Prediabetes: Secondary | ICD-10-CM | POA: Insufficient documentation

## 2020-08-04 DIAGNOSIS — M79642 Pain in left hand: Secondary | ICD-10-CM | POA: Diagnosis not present

## 2020-08-04 MED ORDER — POTASSIUM CHLORIDE CRYS ER 20 MEQ PO TBCR: 20.0000 meq | EXTENDED_RELEASE_TABLET | Freq: Every day | ORAL | 3 refills | Status: DC

## 2020-08-04 MED ORDER — FLECAINIDE ACETATE 50 MG PO TABS: 75.0000 mg | ORAL_TABLET | Freq: Two times a day (BID) | ORAL | 3 refills | Status: DC

## 2020-08-04 NOTE — Progress Notes (Signed)
On 08/04/2020 patient completed Session 11 of Diabetes Prevention Program course virtually with [facility]. By the end of this session patients are able to complete the following objectives:   Virtual Visit via Video Note  I connected with Travis [name]. [place], 01-Sep-1950 by a video enabled application and verified that I am speaking with the correct person using two identifiers.  Location: Patient: Virtual  Provider: Office  Learning Objectives:  Give examples of negative thoughts that could prevent them from meeting their goals of losing weight and being more physically active.   Describe how to stop negative thoughts and talk back to them with positive thoughts.   Practice 1) stopping negative thoughts and 2) talking back to negative thoughts with positive ones.    Goals:   Record weight taken outside of class.   Track foods and beverages eaten each day in the "Food and Activity Tracker," including calories and fat grams for each item.    Track activity type, minutes you were active, and distance you reached each day in the "Food and Activity Tracker."   If you have any negative thoughts-write them in your Food and Activity Trackers, along with how you talked back to them. Practice stopping negative thoughts and talking back to them with positive thoughts.   Follow-Up Plan:  Attend Core Session 12 next week.   Email completed "Food and Activity Tracker" before next week to be reviewed by Lifestyle Coach.

## 2020-08-05 DIAGNOSIS — M25551 Pain in right hip: Secondary | ICD-10-CM | POA: Diagnosis not present

## 2020-08-07 DIAGNOSIS — M25551 Pain in right hip: Secondary | ICD-10-CM | POA: Diagnosis not present

## 2020-08-07 DIAGNOSIS — Z96641 Presence of right artificial hip joint: Secondary | ICD-10-CM | POA: Diagnosis not present

## 2020-08-08 ENCOUNTER — Encounter: Payer: Self-pay | Admitting: Neurology

## 2020-08-08 ENCOUNTER — Other Ambulatory Visit: Payer: Self-pay

## 2020-08-08 ENCOUNTER — Ambulatory Visit: Payer: Medicare Other | Admitting: Neurology

## 2020-08-08 ENCOUNTER — Other Ambulatory Visit: Payer: Self-pay | Admitting: Orthopedic Surgery

## 2020-08-08 VITALS — BP 153/73 | HR 53 | Ht 70.0 in | Wt 261.6 lb

## 2020-08-08 DIAGNOSIS — M79642 Pain in left hand: Secondary | ICD-10-CM | POA: Diagnosis not present

## 2020-08-08 DIAGNOSIS — G5601 Carpal tunnel syndrome, right upper limb: Secondary | ICD-10-CM | POA: Diagnosis not present

## 2020-08-08 DIAGNOSIS — G63 Polyneuropathy in diseases classified elsewhere: Secondary | ICD-10-CM | POA: Diagnosis not present

## 2020-08-08 DIAGNOSIS — M4802 Spinal stenosis, cervical region: Secondary | ICD-10-CM | POA: Diagnosis not present

## 2020-08-08 DIAGNOSIS — Z96641 Presence of right artificial hip joint: Secondary | ICD-10-CM

## 2020-08-08 NOTE — Progress Notes (Signed)
Follow-up Visit   Date: 08/08/20   HERMILO DUTTER MRN: [id number] DOB: Jul 06, 1950   Interim History: Travis [name] is a 70 y.o. 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  The patient was accompanied to the clinic by self.  He has been having worsening left foot pain, which is worse with prolonged standing and walking.  Pain is achy/throbbing.  No worsening numbness/tingling in the left foot.   His neuropathy is well-controlled on gabapentin 600mg  twice daily.   He no longer has arm weakness.  MRI cervical spine was ordered to evaluate these symptoms and shows C4-5 central canal stenosis.  He denies arm paresthesias, weakness, or neck pain.    Medications:  Current Outpatient Medications on File Prior to Visit  Medication Sig Dispense Refill  . acetaminophen (TYLENOL) 650 MG CR tablet Take 1,300 mg by mouth every 8 (eight) hours as needed.     [name] amLODipine (NORVASC) 10 MG tablet Take 10 mg by mouth daily.     [name] apixaban (ELIQUIS) 5 MG TABS tablet Take 1 tablet (5 mg total) by mouth 2 (two) times daily. 60 tablet 10  . b complex vitamins tablet Take 1 tablet by mouth daily.    . cyanocobalamin (,VITAMIN B-12,) 1000 MCG/ML injection 1000 mcg into the muscle monthly. Please give box of 3 ml syringes and box of 1/2" 21g needles    . diltiazem (CARDIZEM) 30 MG tablet Take 1 Tablet Every 4 Hours As Needed For Afib HR Over 100. If using frequently please call our office [phone] 30 tablet 1  . flecainide (TAMBOCOR) 50 MG tablet Take 1.5 tablets (75 mg total) by mouth 2 (two) times daily. 151 tablet 3  . folic acid (FOLVITE) 1 MG tablet Take 1 mg by mouth daily.    [name] gabapentin (NEURONTIN) 300 MG capsule TAKE TWO CAPSULES BY MOUTH TWICE A DAY 180 capsule 3  . hydrochlorothiazide (HYDRODIURIL) 25 MG tablet Take  1 tablet (25 mg total) by mouth daily. 90 tablet 2  . lisinopril (ZESTRIL) 40 MG tablet Take 1 tablet (40 mg total) by mouth daily. 90 tablet 3  . Methotrexate Sodium (METHOTREXATE, PF,) 50 MG/2ML injection Inject into the muscle.    . metoprolol succinate (TOPROL-XL) 100 MG 24 hr tablet Take 100 mg by mouth daily.     [name] oxymetazoline (AFRIN) 0.05 % nasal spray Place 1 spray into both nostrils 2 (two) times daily.    . potassium chloride SA (KLOR-CON) 20 MEQ tablet Take 1 tablet (20 mEq total) by mouth daily. 90 tablet 3  . psyllium (METAMUCIL) 58.6 % powder Take 1 packet by mouth every evening.     . sodium chloride (OCEAN) 0.65 % SOLN nasal spray Place 1 spray into both nostrils as needed for congestion.    . triamcinolone cream (KENALOG) 0.1 % Apply topically as needed.     . fluticasone (FLONASE) 50 MCG/ACT nasal spray Place into both nostrils as needed for allergies or rhinitis. (Patient not taking: Reported on 08/08/2020)    . predniSONE (DELTASONE) 5 MG tablet Take 5 mg by mouth as needed. (Patient not taking: Reported on 08/08/2020)    . Vitamin D, Ergocalciferol, (DRISDOL) 1.25 MG (50000 UT) CAPS capsule TAKE ONE CAPSULE BY MOUTH ONCE WEEKLY (Patient not taking: Reported on 08/08/2020) 4 capsule 3   No current facility-administered medications on file  prior to visit.    Allergies: No Known Allergies  Vital Signs:  BP (!) 153/73 (BP Location: Left Arm, Patient Position: Sitting, Cuff Size: Normal)   Pulse (!) 53   Ht 5\' 10"  (1.778 m)   Wt 261 lb 9.6 oz (118.7 kg)   SpO2 96%   BMI 37.54 kg/m    Neurological Exam: MENTAL STATUS including orientation to time, place, person, recent and remote memory, attention span and concentration, language, and fund of knowledge is normal.  Speech is not dysarthric.  CRANIAL NERVES:   Pupils equal round and reactive.  Normal conjugate, extra-ocular eye movements in all directions of gaze.  No ptosis   MOTOR:  Motor strength is 5/5 in all  extremities. He is unable to flex the left index DIP.  No atrophy, fasciculations or abnormal movements.  No pronator drift.  Tone is normal.    MSRs:                                           Right        Left brachioradialis 3+  3+  biceps 2+  2+  triceps 2+  2+  patellar 2+  2+  ankle jerk 0  0   SENSORY:  Vibration absent at the ankles bilaterally, intact at the knees and MCP.  COORDINATION/GAIT:  Gait is mildly wide-based due to body habitus, unassisted and stable.  Data: Lab Results  Component Value Date   [id number] 324 05/13/2017   Lab Results  Component Value Date   FOLATE 19.1 05/10/2019   MRI lumbar spine wwo contrast 10/12/2017: 1. Lumbar spondylosis and degenerative disc disease causing mild  impingement at L4-5 and L5-S1.  2. There is some marrow heterogeneity but nofocal enhancing lesion  characteristic of lumbar myeloma is identified.  3. Mild levoconvex lumbar scoliosis.   MRI lumbar spine wo contrast 04/23/2020: 1. No acute abnormality or significant change from prior MRI. 2. Mild multilevel degenerative changes of the lumbar spine with mild narrowing of the bilateral subarticular zones at L4-5. 3. No significant spinal canal or neural foraminal stenosis at any Level.  MRI cervical spine 03/06/2020: Left foraminal narrowing C2-3 and C3-4  Moderate spinal stenosis C4-5. Moderate foraminal stenosis bilaterally right greater than left due to spurring  Mild spinal stenosis C5-6 with mild foraminal stenosis bilaterally.  NCS/EMG of the arms 04/04/2020: 1. Chronic C5 radiculopathy affecting bilateral upper extremities, mild. 2. 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     IMPRESSION/PLAN: 1.  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  MRI cervical spine was personally viewed with patient. Agree with findings.   - Arm strength has improved, he  denies neck pain  - Continue to monitor  2.  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  Right carpal tunnel syndrome, mild  - Occasionally, he has bilateral hand paresthesias in the morning   Return to clinic in 6 months.   Thank you for allowing me to participate in patient's care.  If I can answer any additional questions, I would be pleased to do so.    Sincerely,    Francis Doenges K. Posey Pronto, DO

## 2020-08-08 NOTE — Patient Instructions (Addendum)
Check your feet daily  Take care walking on uneven ground  Return to clinic in 1 year

## 2020-08-11 ENCOUNTER — Encounter (HOSPITAL_BASED_OUTPATIENT_CLINIC_OR_DEPARTMENT_OTHER): Payer: Medicare Other | Admitting: Dietician

## 2020-08-11 ENCOUNTER — Other Ambulatory Visit: Payer: Self-pay

## 2020-08-11 DIAGNOSIS — R7303 Prediabetes: Secondary | ICD-10-CM | POA: Diagnosis not present

## 2020-08-11 DIAGNOSIS — M79641 Pain in right hand: Secondary | ICD-10-CM | POA: Diagnosis not present

## 2020-08-11 NOTE — Progress Notes (Signed)
Patient was seen on 08/11/2020 for the Core Session 12 of Diabetes Prevention Program course at [facility]. By the end of this session patients are able to complete the following objectives:   Virtual Visit via Video Note  I connected with Travis [name], 1951-02-06 on 08/11/20 at 10:30 AM EDT by a video enabled application and verified that I am speaking with the correct person using two identifiers.  Location: Patient: Virtual  Provider: Office  Learning Objectives:  Describe their current progress toward defined goals.  Describe common causes for slipping from healthy eating or being  active.  Explain what to do to get back on their feet after a slip.  Goals:   Record weight taken outside of class.   Track foods and beverages eaten each day in the "Food and Activity Tracker," including calories and fat grams for each item.    Track activity type, minutes active, and distance reached each day in the "Food and Activity Tracker."   Try out the two action plans created during session- "Slips from Healthy Eating: Action Plan" and "Slips from Being Active: Action Plan"  Answer questions on the handout.   Follow-Up Plan:  Attend Core Session 13 next week.   Bring completed "Food and Activity Tracker" next week to be reviewed by Lifestyle Coach.

## 2020-08-15 ENCOUNTER — Ambulatory Visit
Admission: RE | Admit: 2020-08-15 | Discharge: 2020-08-15 | Disposition: A | Discharge status: Home / Self Care | Payer: Medicare Other | Source: Ambulatory Visit | Attending: Orthopedic Surgery | Admitting: Orthopedic Surgery

## 2020-08-15 ENCOUNTER — Other Ambulatory Visit: Payer: Self-pay

## 2020-08-15 DIAGNOSIS — Z96641 Presence of right artificial hip joint: Secondary | ICD-10-CM | POA: Diagnosis not present

## 2020-08-15 DIAGNOSIS — M25551 Pain in right hip: Secondary | ICD-10-CM | POA: Diagnosis not present

## 2020-08-17 ENCOUNTER — Other Ambulatory Visit: Payer: Self-pay | Admitting: Internal Medicine

## 2020-08-18 ENCOUNTER — Other Ambulatory Visit: Payer: Self-pay

## 2020-08-18 ENCOUNTER — Encounter (HOSPITAL_BASED_OUTPATIENT_CLINIC_OR_DEPARTMENT_OTHER): Payer: Medicare Other | Admitting: Dietician

## 2020-08-18 DIAGNOSIS — R7303 Prediabetes: Secondary | ICD-10-CM | POA: Diagnosis not present

## 2020-08-18 MED ORDER — HYDROCHLOROTHIAZIDE 25 MG PO TABS: 25.0000 mg | ORAL_TABLET | Freq: Every day | ORAL | 3 refills | Status: DC

## 2020-08-18 NOTE — Progress Notes (Signed)
On 08/18/2020 patient completed the Core Session 13 of Diabetes Prevention Program course virtually with [facility]. By the end of this session patients are able to complete the following objectives:   Virtual Visit via Video Note  I connected with@ on 08/18/20 at 10:30 AM EDT by a video enabled application and verified that I am speaking with the correct person using two identifiers.  Location: Patient: Virtual Provider: Office  Learning Objectives:  Describe ways to add interest and variety to their activity plans.  Define ?aerobic fitness.  Explain the four F.I.T.T. principles (frequency, intensity, time, and type of activity) and how they relate to aerobic fitness.   Goals:   Record weight taken outside of class.   Track foods and beverages eaten each day in the "Food and Activity Tracker," including calories and fat grams for each item.    Track activity type, minutes you were active, and distance you reached each day in the "Food and Activity Tracker."   Do your best to reach activity goal for the week.  Use one of the F.I.T.T. principles to jump start workouts.  Document activity level on the "To Do Next Week" handout.  Follow-Up Plan:  Attend Core Session 14 next week.   Email completed "Food and Activity Tracker" before next week to be reviewed by Lifestyle Coach.

## 2020-08-19 DIAGNOSIS — G4733 Obstructive sleep apnea (adult) (pediatric): Secondary | ICD-10-CM | POA: Diagnosis not present

## 2020-08-20 LAB — ANAEROBIC AND AEROBIC CULTURE
AER RESULT:: NO GROWTH
GRAM STAIN:: NONE SEEN
MICRO NUMBER:: 11776322
MICRO NUMBER:: 11776323
SPECIMEN QUALITY:: ADEQUATE
SPECIMEN QUALITY:: ADEQUATE

## 2020-08-20 LAB — CELL COUNT + DIFF, W/O CRYST-SYNVL FLD
Basophils, %: 0 %
Eosinophils-Synovial: 0 % (ref 0–2)
Lymphocytes-Synovial Fld: 27 % (ref 0–74)
Monocyte/Macrophage: 22 % (ref 0–69)
Neutrophil, Synovial: 51 % — ABNORMAL HIGH (ref 0–24)
Synoviocytes, %: 0 % (ref 0–15)
WBC, Synovial: 15828 cells/uL — ABNORMAL HIGH (ref <150)

## 2020-08-22 DIAGNOSIS — M79642 Pain in left hand: Secondary | ICD-10-CM | POA: Diagnosis not present

## 2020-08-22 DIAGNOSIS — Z79899 Other long term (current) drug therapy: Secondary | ICD-10-CM | POA: Diagnosis not present

## 2020-08-22 DIAGNOSIS — D472 Monoclonal gammopathy: Secondary | ICD-10-CM | POA: Diagnosis not present

## 2020-08-22 DIAGNOSIS — M0609 Rheumatoid arthritis without rheumatoid factor, multiple sites: Secondary | ICD-10-CM | POA: Diagnosis not present

## 2020-08-22 DIAGNOSIS — M2550 Pain in unspecified joint: Secondary | ICD-10-CM | POA: Diagnosis not present

## 2020-08-22 DIAGNOSIS — M65322 Trigger finger, left index finger: Secondary | ICD-10-CM | POA: Diagnosis not present

## 2020-08-25 ENCOUNTER — Encounter: Payer: Medicare Other | Admitting: Registered"

## 2020-08-25 DIAGNOSIS — T8451XA Infection and inflammatory reaction due to internal right hip prosthesis, initial encounter: Secondary | ICD-10-CM | POA: Diagnosis not present

## 2020-08-25 DIAGNOSIS — Z96641 Presence of right artificial hip joint: Secondary | ICD-10-CM | POA: Diagnosis not present

## 2020-08-25 DIAGNOSIS — M25551 Pain in right hip: Secondary | ICD-10-CM | POA: Diagnosis not present

## 2020-09-01 ENCOUNTER — Encounter: Payer: Self-pay | Admitting: Registered"

## 2020-09-01 ENCOUNTER — Encounter: Payer: Medicare Other | Attending: Geriatric Medicine | Admitting: Registered"

## 2020-09-01 DIAGNOSIS — Z713 Dietary counseling and surveillance: Secondary | ICD-10-CM | POA: Diagnosis not present

## 2020-09-01 DIAGNOSIS — R7303 Prediabetes: Secondary | ICD-10-CM

## 2020-09-01 DIAGNOSIS — M67962 Unspecified disorder of synovium and tendon, left lower leg: Secondary | ICD-10-CM | POA: Diagnosis not present

## 2020-09-01 DIAGNOSIS — M79672 Pain in left foot: Secondary | ICD-10-CM | POA: Diagnosis not present

## 2020-09-01 DIAGNOSIS — M19072 Primary osteoarthritis, left ankle and foot: Secondary | ICD-10-CM | POA: Diagnosis not present

## 2020-09-01 NOTE — Progress Notes (Signed)
On 09/01/20 patient completed Core Session 15 of Diabetes Prevention Program course virtually with [facility]. By the end of this session patients are able to complete the following objectives:   Virtual Visit via Video Note  I connected with Travis [name] on 09/01/20 at 10:30 AM EDT by a video enabled application and verified that I am speaking with the correct person using two identifiers.  Location: Patient: Home.  Provider: Office.  Learning Objectives:  Explain how to prevent stress or cope with unavoidable stress.   Describe how this program can be a source of stress.   Explain how to manage stressful situations.   Create and follow an action plan for either preventing or coping with a stressful situation.   Goals:   Record weight taken outside of class.   Track foods and beverages eaten each day in the "Food and Activity Tracker," including calories and fat grams for each item.    Track activity type, minutes you were active, and distance you reached each day in the "Food and Activity Tracker."   Do your best to reach activity goal for the week.  Follow your action plan to reduce stress.   Answer questions on handout regarding success of action plan.   Follow-Up Plan:  Attend Core Session 16 next week.   Email completed "Food and Activity Tracker" before next week to be reviewed by Lifestyle Coach.

## 2020-09-03 ENCOUNTER — Telehealth: Payer: Self-pay | Admitting: *Deleted

## 2020-09-03 NOTE — Telephone Encounter (Signed)
CORRECTION: PROCEDURE IS FOR RESECTION OF RIGHT TOTAL HIP ARTHROPLASTY & ANTIBIOTIC SPACER PLACEMENT

## 2020-09-03 NOTE — Telephone Encounter (Signed)
   [facility] Pre-operative Risk Assessment    Patient Name: Travis [name]  DOB: [birth year]  MRN: [id number]   [facility] STAFF: - Please ensure there is not already an duplicate clearance open for this procedure. - Under Visit Info/Reason for Call, type in Other and utilize the format Clearance MM/DD/YY or Clearance TBD. Do not use dashes or single digits. - If request is for dental extraction, please clarify the # of teeth to be extracted.  Request for surgical clearance:  1. What type of surgery is being performed? RESECTION OF RIGHT TOTAL KNEE ARTHROPLASTY & ANTIBIOTIC SPACER CHROME   2. When is this surgery scheduled? 09/25/20   3. What type of clearance is required (medical clearance vs. Pharmacy clearance to hold med vs. Both)? BOTH  4. Are there any medications that need to be held prior to surgery and how long? ELIQUIS   5. Practice name and name of physician performing surgery? [facility]; DR. Wheaton   6. What is the office phone number? [phone]   7.   What is the office fax number? [phone]  8.   Anesthesia type (None, local, MAC, general) ? SPINAL   Julaine Hua 09/03/2020, 2:16 PM  _________________________________________________________________   (provider comments below)

## 2020-09-03 NOTE — Telephone Encounter (Signed)
Patient with diagnosis of atrial fibrillation on Eliquis for anticoagulation.    Procedure: resection of right total knee arthroplasty & antibiotic spacer chrome Date of procedure: 09/25/20   CHA2DS2-VASc Score = 2  This indicates a 2.2% annual risk of stroke. The patient's score is based upon: CHF History: No HTN History: Yes Diabetes History: No Stroke History: No Vascular Disease History: No Age Score: 1 Gender Score: 0  CrCl 154, (94.7 with IBW) Platelet count 284  Chart notes history of DVT.  However there is no indication of when (or where) this occurred.  First noted in 05/04/17 visit, but no mention prior.  Unknown if was provoked or not or when it actually occurred.  Reached out to patient.  He reports episode of phlebitis sometime around 2006-2008.  No indication of provoked event, no problems since that time.  Would recommend that patient is okay to hold Eliquis x 3 days prior to procedure, but will verify with MD for final decision.   ALSO: WHEN I SPOKE WITH PATIENT, HE STATED WAS SCHEDULED FOR HIP NOT KNEE SURGERY.  PLEASE CLARIFY WITH [facility] BEFORE FINAL APPROVAL.     For orthopedic procedures please be sure to resume therapeutic (not prophylactic) dosing.

## 2020-09-04 DIAGNOSIS — Z85820 Personal history of malignant melanoma of skin: Secondary | ICD-10-CM | POA: Diagnosis not present

## 2020-09-04 DIAGNOSIS — L819 Disorder of pigmentation, unspecified: Secondary | ICD-10-CM | POA: Diagnosis not present

## 2020-09-04 DIAGNOSIS — D485 Neoplasm of uncertain behavior of skin: Secondary | ICD-10-CM | POA: Diagnosis not present

## 2020-09-04 DIAGNOSIS — C44329 Squamous cell carcinoma of skin of other parts of face: Secondary | ICD-10-CM | POA: Diagnosis not present

## 2020-09-04 DIAGNOSIS — L570 Actinic keratosis: Secondary | ICD-10-CM | POA: Diagnosis not present

## 2020-09-04 DIAGNOSIS — L905 Scar conditions and fibrosis of skin: Secondary | ICD-10-CM | POA: Diagnosis not present

## 2020-09-04 NOTE — Telephone Encounter (Signed)
Still recommend 3 day Eliquis hold based on bleed risk of procedure - awaiting confirmation from Dr Harrington Challenger that this is acceptable given DVT history - see Kristin's note below for details.

## 2020-09-08 ENCOUNTER — Other Ambulatory Visit: Payer: Self-pay

## 2020-09-08 ENCOUNTER — Encounter (HOSPITAL_BASED_OUTPATIENT_CLINIC_OR_DEPARTMENT_OTHER): Payer: Medicare Other | Admitting: Dietician

## 2020-09-08 DIAGNOSIS — R7303 Prediabetes: Secondary | ICD-10-CM | POA: Diagnosis not present

## 2020-09-08 NOTE — Progress Notes (Signed)
On 09/08/2020 patient completed Core Session 16 of Diabetes Prevention Program course virtually with [facility]. By the end of this session patients are able to complete the following objectives:   Virtual Visit via Video Note  I connected with Travis [name], [birth year] on 09/08/20 at 10:30 AM EDT by a video enabled application and verified that I am speaking with the correct person using two identifiers.  Location: Patient: Virtual Provider: Office  Learning Objectives:  Measure their progress toward weight and physical activity goals since Session 1.   Develop a plan for improving progress, if their goals have not yet been attained.   Describe ways to stay motivated long-term.   Goals:   Record weight taken outside of class.   Track foods and beverages eaten each day in the "Food and Activity Tracker," including calories and fat grams for each item.    Track activity type, minutes you were active, and distance you reached each day in the "Food and Activity Tracker."   Utilize action plan to help stay motivated and complete questions on "To Do List."   Follow-Up Plan:  Attend session 17 in two weeks.   Email completed "Food and Activity Tracker" before next session to be reviewed by Lifestyle Coach.

## 2020-09-12 DIAGNOSIS — T8451XD Infection and inflammatory reaction due to internal right hip prosthesis, subsequent encounter: Secondary | ICD-10-CM | POA: Diagnosis not present

## 2020-09-12 DIAGNOSIS — C44329 Squamous cell carcinoma of skin of other parts of face: Secondary | ICD-10-CM | POA: Diagnosis not present

## 2020-09-12 DIAGNOSIS — Z96641 Presence of right artificial hip joint: Secondary | ICD-10-CM | POA: Diagnosis not present

## 2020-09-12 NOTE — Telephone Encounter (Signed)
   Name: Travis [name]  DOB: 1951-03-01  MRN: [id number]   Primary Cardiologist: [name], MD  Chart reviewed as part of pre-operative protocol coverage. Patient was contacted 09/12/2020 in reference to pre-operative risk assessment for pending surgery as outlined below.  Travis [name] was last seen on 07/22/20 by [name].  Since that day, Travis [name] has done well. He reports no anginal symptoms and an exercise tolerance of > 4METS  Per pharmacy and MD review may hold Eliquis 3 days prior to planned procedure. He verbalized understanding of these directions.  Therefore, based on ACC/AHA guidelines, the patient would be at acceptable risk for the planned procedure without further cardiovascular testing.   The patient was advised that if he develops new symptoms prior to surgery to contact our office to arrange for a follow-up visit, and he verbalized understanding.  I will route this recommendation to the requesting party via Epic fax function and remove from pre-op pool. Please call with questions.  Loel Dubonnet, NP 09/12/2020, 10:53 AM

## 2020-09-12 NOTE — Telephone Encounter (Signed)
   Name: Travis [name]  DOB: [birth year]  MRN: [id number]   Primary Cardiologist: [name], MD  Chart reviewed as part of pre-operative protocol coverage. Patient was contacted 09/12/2020 in reference to pre-operative risk assessment for pending surgery as outlined below.  JAEVEN WANZER was last seen on 07/22/20 by [name].  He has upcoming resection of right total hip arthroplasty and antibiotic spacer placement.  Per pharmacy and cardiologist review, may hold Eliquis 3 days prior to planned procedure.   Left VM requesting call back.   Loel Dubonnet, NP 09/12/2020, 10:28 AM

## 2020-09-15 DIAGNOSIS — M25572 Pain in left ankle and joints of left foot: Secondary | ICD-10-CM | POA: Diagnosis not present

## 2020-09-16 ENCOUNTER — Other Ambulatory Visit: Payer: Self-pay | Admitting: Neurology

## 2020-09-16 NOTE — Progress Notes (Signed)
DUE TO COVID-19 ONLY ONE VISITOR IS ALLOWED TO COME WITH YOU AND STAY IN THE WAITING ROOM ONLY DURING PRE OP AND PROCEDURE DAY OF SURGERY. THE 1 VISITOR  MAY VISIT WITH YOU AFTER SURGERY IN YOUR PRIVATE ROOM DURING VISITING HOURS ONLY!  YOU NEED TO HAVE A COVID 19 TEST ON_______ @_______ , THIS TEST MUST BE DONE BEFORE SURGERY,  COVID TESTING SITE [address] [place] [address], IT IS ON THE RIGHT GOING OUT [address] APPROXIMATELY  2 MINUTES PAST [employer] ON THE RIGHT. ONCE YOUR COVID TEST IS COMPLETED,  PLEASE BEGIN THE QUARANTINE INSTRUCTIONS AS OUTLINED IN YOUR HANDOUT.                DARVIN DIALS  09/16/2020   Your procedure is scheduled on:    09/25/2020   Report to [facility] Main  Entrance   Report to admitting at     0730 AM     Call this number if you have problems the morning of surgery [phone]    REMEMBER: NO  SOLID FOOD CANDY OR GUM AFTER MIDNIGHT. CLEAR LIQUIDS UNTIL   0700 am      . NOTHING BY MOUTH EXCEPT CLEAR LIQUIDS UNTIL  0700am     . PLEASE FINISH ENSURE DRINK PER SURGEON ORDER  WHICH NEEDS TO BE COMPLETED AT 0700am      .      CLEAR LIQUID DIET   Foods Allowed                                                                    Coffee and tea, regular and decaf                            Fruit ices (not with fruit pulp)                                      Iced Popsicles                                    Carbonated beverages, regular and diet                                    Cranberry, grape and apple juices Sports drinks like Gatorade Lightly seasoned clear broth or consume(fat free) Sugar, honey syrup ___________________________________________________________________      BRUSH YOUR TEETH MORNING OF SURGERY AND RINSE YOUR MOUTH OUT, NO CHEWING GUM CANDY OR MINTS.     Take these medicines the morning of surgery with A SIP OF WATER:              Toprol, gabapentin, flecainide, amlodipine  DO NOT TAKE ANY  DIABETIC MEDICATIONS DAY OF YOUR SURGERY                               You may not have any metal on your body including  hair pins and              piercings  Do not wear jewelry, make-up, lotions, powders or perfumes, deodorant             Do not wear nail polish on your fingernails.  Do not shave  48 hours prior to surgery.              Men may shave face and neck.   Do not bring valuables to the hospital. [place].  Contacts, dentures or bridgework may not be worn into surgery.  Leave suitcase in the car. After surgery it may be brought to your room.     Patients discharged the day of surgery will not be allowed to drive home. IF YOU ARE HAVING SURGERY AND GOING HOME THE SAME DAY, YOU MUST HAVE AN ADULT TO DRIVE YOU HOME AND BE WITH YOU FOR 24 HOURS. YOU MAY GO HOME BY TAXI OR UBER OR ORTHERWISE, BUT AN ADULT MUST ACCOMPANY YOU HOME AND STAY WITH YOU FOR 24 HOURS.  Name and phone number of your driver:  Special Instructions: N/A              Please read over the following fact sheets you were given: _____________________________________________________________________  [facility] - Preparing for Surgery Before surgery, you can play an important role.  Because skin is not sterile, your skin needs to be as free of germs as possible.  You can reduce the number of germs on your skin by washing with CHG (chlorahexidine gluconate) soap before surgery.  CHG is an antiseptic cleaner which kills germs and bonds with the skin to continue killing germs even after washing. Please DO NOT use if you have an allergy to CHG or antibacterial soaps.  If your skin becomes reddened/irritated stop using the CHG and inform your nurse when you arrive at Short Stay. Do not shave (including legs and underarms) for at least 48 hours prior to the first CHG shower.  You may shave your face/neck. Please follow these instructions carefully:  1.  Shower with CHG Soap  the night before surgery and the  morning of Surgery.  2.  If you choose to wash your hair, wash your hair first as usual with your  normal  shampoo.  3.  After you shampoo, rinse your hair and body thoroughly to remove the  shampoo.                           4.  Use CHG as you would any other liquid soap.  You can apply chg directly  to the skin and wash                       Gently with a scrungie or clean washcloth.  5.  Apply the CHG Soap to your body ONLY FROM THE NECK DOWN.   Do not use on face/ open                           Wound or open sores. Avoid contact with eyes, ears mouth and genitals (private parts).                       Wash face,  Genitals (private parts) with  your normal soap.             6.  Wash thoroughly, paying special attention to the area where your surgery  will be performed.  7.  Thoroughly rinse your body with warm water from the neck down.  8.  DO NOT shower/wash with your normal soap after using and rinsing off  the CHG Soap.                9.  Pat yourself dry with a clean towel.            10.  Wear clean pajamas.            11.  Place clean sheets on your bed the night of your first shower and do not  sleep with pets. Day of Surgery : Do not apply any lotions/deodorants the morning of surgery.  Please wear clean clothes to the hospital/surgery center.  FAILURE TO FOLLOW THESE INSTRUCTIONS MAY RESULT IN THE CANCELLATION OF YOUR SURGERY PATIENT SIGNATURE_________________________________  NURSE SIGNATURE__________________________________  ________________________________________________________________________

## 2020-09-18 ENCOUNTER — Other Ambulatory Visit: Payer: Self-pay

## 2020-09-18 ENCOUNTER — Encounter (HOSPITAL_COMMUNITY)
Admission: RE | Admit: 2020-09-18 | Discharge: 2020-09-18 | Disposition: A | Discharge status: Home / Self Care | Payer: Medicare Other | Source: Ambulatory Visit | Attending: Orthopedic Surgery | Admitting: Orthopedic Surgery

## 2020-09-18 ENCOUNTER — Encounter (HOSPITAL_COMMUNITY): Payer: Self-pay

## 2020-09-18 DIAGNOSIS — T8451XA Infection and inflammatory reaction due to internal right hip prosthesis, initial encounter: Secondary | ICD-10-CM | POA: Insufficient documentation

## 2020-09-18 DIAGNOSIS — Z79899 Other long term (current) drug therapy: Secondary | ICD-10-CM | POA: Diagnosis not present

## 2020-09-18 DIAGNOSIS — Z86718 Personal history of other venous thrombosis and embolism: Secondary | ICD-10-CM | POA: Insufficient documentation

## 2020-09-18 DIAGNOSIS — Z01818 Encounter for other preprocedural examination: Secondary | ICD-10-CM | POA: Diagnosis not present

## 2020-09-18 DIAGNOSIS — Z7901 Long term (current) use of anticoagulants: Secondary | ICD-10-CM | POA: Diagnosis not present

## 2020-09-18 DIAGNOSIS — I480 Paroxysmal atrial fibrillation: Secondary | ICD-10-CM | POA: Insufficient documentation

## 2020-09-18 DIAGNOSIS — I493 Ventricular premature depolarization: Secondary | ICD-10-CM | POA: Insufficient documentation

## 2020-09-18 DIAGNOSIS — I10 Essential (primary) hypertension: Secondary | ICD-10-CM | POA: Diagnosis not present

## 2020-09-18 DIAGNOSIS — G4733 Obstructive sleep apnea (adult) (pediatric): Secondary | ICD-10-CM | POA: Diagnosis not present

## 2020-09-18 DIAGNOSIS — M65322 Trigger finger, left index finger: Secondary | ICD-10-CM | POA: Diagnosis not present

## 2020-09-18 HISTORY — DX: Prediabetes: R73.03

## 2020-09-18 LAB — SURGICAL PCR SCREEN
MRSA, PCR: NEGATIVE
Staphylococcus aureus: NEGATIVE

## 2020-09-18 LAB — COMPREHENSIVE METABOLIC PANEL
ALT: 25 U/L (ref 0–44)
AST: 28 U/L (ref 15–41)
Albumin: 4.1 g/dL (ref 3.5–5.0)
Alkaline Phosphatase: 85 U/L (ref 38–126)
Anion gap: 11 (ref 5–15)
BUN: 16 mg/dL (ref 8–23)
CO2: 29 mmol/L (ref 22–32)
Calcium: 9.3 mg/dL (ref 8.9–10.3)
Chloride: 101 mmol/L (ref 98–111)
Creatinine, Ser: 0.63 mg/dL (ref 0.61–1.24)
GFR, Estimated: >60 mL/min (ref >60)
Glucose, Bld: 91 mg/dL (ref 70–99)
Potassium: 3.9 mmol/L (ref 3.5–5.1)
Sodium: 141 mmol/L (ref 135–145)
Total Bilirubin: 0.7 mg/dL (ref 0.3–1.2)
Total Protein: 7.5 g/dL (ref 6.5–8.1)

## 2020-09-18 LAB — PROTIME-INR
INR: 1.1 (ref 0.8–1.2)
Prothrombin Time: 14.2 seconds (ref 11.4–15.2)

## 2020-09-18 LAB — CBC
HCT: 42.7 % (ref 39.0–52.0)
Hemoglobin: 14.4 g/dL (ref 13.0–17.0)
MCH: 32 pg (ref 26.0–34.0)
MCHC: 33.7 g/dL (ref 30.0–36.0)
MCV: 94.9 fL (ref 80.0–100.0)
Platelets: 298 10*3/uL (ref 150–400)
RBC: 4.5 MIL/uL (ref 4.22–5.81)
RDW: 13 % (ref 11.5–15.5)
WBC: 8.4 10*3/uL (ref 4.0–10.5)
nRBC: 0 % (ref 0.0–0.2)

## 2020-09-18 LAB — APTT: aPTT: 36 seconds (ref 24–36)

## 2020-09-18 LAB — HEMOGLOBIN A1C
Hgb A1c MFr Bld: 5.8 % — ABNORMAL HIGH (ref 4.8–5.6)
Mean Plasma Glucose: 119.76 mg/dL

## 2020-09-18 NOTE — Telephone Encounter (Signed)
Call from [employer] from PAT. Reports HR 45-48, 143/77.  EKG in Epic.  SB. Has reviewed with pre op anesthesia.  Continuing with current plans for OR next Thursday. Reviewed with [name]. Pt taking 100 mg Toprol daily.  Not using any diltiazem. She recommends no changes to medications based on these readings and the increase in PVCs pt is currently having.  Called pt and reviewed information and reinforced no need for any changes at this time.

## 2020-09-18 NOTE — Progress Notes (Signed)
Pt in today for preop appt.  Stated has noticed increase in pvcs and has a call into Cardiology.  Pt's heart rate at preop was 45-48.  EKG done Sinus brady- rate 48.  Just wanted to let Cardiology be aware of above.

## 2020-09-18 NOTE — Progress Notes (Addendum)
Anesthesia Review:  PCP:   DR hal Stoneking  Cardiologist : DR [name] [place] 07/22/20  [facility] - 09/03/20- telephone encounter  Chest x-ray : EKG :09/18/20 , 07/22/20  Echo : 01/04/20  Stress test: Cardiac Cath :  Activity level:  Sleep Study/ CPAP :cpap  Fasting Blood Sugar :      / Checks Blood Sugar -- times a day:   Blood Thinner/ Instructions /Last Dose: ASA / Instructions/ Last Dose :  Prediabetes  eliquis- Stop 2 days prior to surgery per pt  PT reports increase in pvcs he has noted.  Hasl call into [facility] .  HR- 45-47 on dynamapp.  EKG done hearat rate. 48.  Sinus brady  Roanna Banning made aware.  Informed pt to call cardiology and let them be aware of heart rate at preop visit today and informed pt that I would also call [facility]. Called and left call back for someone to call from Cardiology.  Also routed a note to Dr [name] regarding above.  Cardiology called back and spoke with [facility] in regards to above.  DR Harrington Challenger will be made aware of above and per Rodman Key DR Harrington Challenger to call pt  [id number]-09/18/2020-5.8

## 2020-09-22 ENCOUNTER — Other Ambulatory Visit: Payer: Self-pay

## 2020-09-22 ENCOUNTER — Other Ambulatory Visit (HOSPITAL_COMMUNITY)
Admission: RE | Admit: 2020-09-22 | Discharge: 2020-09-22 | Disposition: A | Discharge status: Home / Self Care | Payer: Medicare Other | Source: Ambulatory Visit | Attending: Orthopedic Surgery | Admitting: Orthopedic Surgery

## 2020-09-22 ENCOUNTER — Encounter (HOSPITAL_BASED_OUTPATIENT_CLINIC_OR_DEPARTMENT_OTHER): Payer: Medicare Other | Admitting: Dietician

## 2020-09-22 ENCOUNTER — Other Ambulatory Visit (HOSPITAL_COMMUNITY): Payer: Medicare Other

## 2020-09-22 DIAGNOSIS — I10 Essential (primary) hypertension: Secondary | ICD-10-CM | POA: Diagnosis not present

## 2020-09-22 DIAGNOSIS — Z79899 Other long term (current) drug therapy: Secondary | ICD-10-CM | POA: Diagnosis not present

## 2020-09-22 DIAGNOSIS — R600 Localized edema: Secondary | ICD-10-CM | POA: Diagnosis not present

## 2020-09-22 DIAGNOSIS — G4733 Obstructive sleep apnea (adult) (pediatric): Secondary | ICD-10-CM | POA: Diagnosis not present

## 2020-09-22 DIAGNOSIS — Z823 Family history of stroke: Secondary | ICD-10-CM | POA: Diagnosis not present

## 2020-09-22 DIAGNOSIS — R5381 Other malaise: Secondary | ICD-10-CM | POA: Diagnosis not present

## 2020-09-22 DIAGNOSIS — I480 Paroxysmal atrial fibrillation: Secondary | ICD-10-CM | POA: Diagnosis not present

## 2020-09-22 DIAGNOSIS — T8451XA Infection and inflammatory reaction due to internal right hip prosthesis, initial encounter: Secondary | ICD-10-CM | POA: Diagnosis not present

## 2020-09-22 DIAGNOSIS — M25451 Effusion, right hip: Secondary | ICD-10-CM | POA: Diagnosis not present

## 2020-09-22 DIAGNOSIS — Z20822 Contact with and (suspected) exposure to covid-19: Secondary | ICD-10-CM | POA: Insufficient documentation

## 2020-09-22 DIAGNOSIS — Z8672 Personal history of thrombophlebitis: Secondary | ICD-10-CM | POA: Diagnosis not present

## 2020-09-22 DIAGNOSIS — Z8249 Family history of ischemic heart disease and other diseases of the circulatory system: Secondary | ICD-10-CM | POA: Diagnosis not present

## 2020-09-22 DIAGNOSIS — Z86718 Personal history of other venous thrombosis and embolism: Secondary | ICD-10-CM | POA: Diagnosis not present

## 2020-09-22 DIAGNOSIS — T8451XD Infection and inflammatory reaction due to internal right hip prosthesis, subsequent encounter: Secondary | ICD-10-CM | POA: Diagnosis not present

## 2020-09-22 DIAGNOSIS — Z9889 Other specified postprocedural states: Secondary | ICD-10-CM | POA: Diagnosis not present

## 2020-09-22 DIAGNOSIS — E669 Obesity, unspecified: Secondary | ICD-10-CM | POA: Diagnosis present

## 2020-09-22 DIAGNOSIS — Z01812 Encounter for preprocedural laboratory examination: Secondary | ICD-10-CM | POA: Insufficient documentation

## 2020-09-22 DIAGNOSIS — K219 Gastro-esophageal reflux disease without esophagitis: Secondary | ICD-10-CM | POA: Diagnosis not present

## 2020-09-22 DIAGNOSIS — Z6837 Body mass index (BMI) 37.0-37.9, adult: Secondary | ICD-10-CM | POA: Diagnosis not present

## 2020-09-22 DIAGNOSIS — Y792 Prosthetic and other implants, materials and accessory orthopedic devices associated with adverse incidents: Secondary | ICD-10-CM | POA: Diagnosis present

## 2020-09-22 DIAGNOSIS — R6889 Other general symptoms and signs: Secondary | ICD-10-CM | POA: Diagnosis not present

## 2020-09-22 DIAGNOSIS — R7303 Prediabetes: Secondary | ICD-10-CM

## 2020-09-22 DIAGNOSIS — I493 Ventricular premature depolarization: Secondary | ICD-10-CM | POA: Diagnosis not present

## 2020-09-22 DIAGNOSIS — Z743 Need for continuous supervision: Secondary | ICD-10-CM | POA: Diagnosis not present

## 2020-09-22 DIAGNOSIS — Z471 Aftercare following joint replacement surgery: Secondary | ICD-10-CM | POA: Diagnosis not present

## 2020-09-22 DIAGNOSIS — Z96641 Presence of right artificial hip joint: Secondary | ICD-10-CM | POA: Diagnosis not present

## 2020-09-22 DIAGNOSIS — Z7901 Long term (current) use of anticoagulants: Secondary | ICD-10-CM | POA: Diagnosis not present

## 2020-09-22 DIAGNOSIS — I499 Cardiac arrhythmia, unspecified: Secondary | ICD-10-CM | POA: Diagnosis not present

## 2020-09-22 DIAGNOSIS — M1990 Unspecified osteoarthritis, unspecified site: Secondary | ICD-10-CM | POA: Diagnosis not present

## 2020-09-22 DIAGNOSIS — C9000 Multiple myeloma not having achieved remission: Secondary | ICD-10-CM | POA: Diagnosis not present

## 2020-09-22 LAB — SARS CORONAVIRUS 2 (TAT 6-24 HRS): SARS Coronavirus 2: NEGATIVE

## 2020-09-22 NOTE — Progress Notes (Signed)
On 09/22/2020 patient completed a post core session of the Diabetes Prevention Program course virtually with [facility]. By the end of this session patients are able to complete the following objectives:   Virtual Visit via Video Note  I connected with Travis Anis. [name], 10-08-1950 by a video enabled application and verified that I am speaking with the correct person using two identifiers.  Location: Patient: Virtual Provider: Office  Learning Objectives:  Identify how to maintain and/or continue working toward program goals for the remainder of the program.   Describe ways that food and activity tracking can assist them in maintaining/reaching program goals.   Identify progress they have made since the beginning of the program.   Describe the differences between unsaturated, saturated, and trans fat on heart health.   List dietary sources of unsaturated, saturated, and trans fats.  Explain ways to reduce intake of saturated fat and replace them with heart healthy fats.  Goals:   Record weight taken outside of class.   Track foods and beverages eaten each day in the "Food and Activity Tracker," including calories and fat grams for each item.    Track activity type, minutes you were active, and distance you reached each day in the "Food and Activity Tracker."   Follow-Up Plan: . Attend next session.  . Email completed "Food and Activity Trackers" before next session to be reviewed by Lifestyle Coach.

## 2020-09-22 NOTE — Progress Notes (Addendum)
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 Pt reported recent increase in PVCs. Pt taking metoprolol but not diltiazem. [name] notified, prefers pt continue current dose of metoprolol due to pt report of increased PVCs. (See progress note by Rodman Key, RN on 09/18/20.)   Pt reported he will hold eliquis 2 days before surgery, however note from pharmacist states 3 days. Sherry in Dr. Aurea Graff office notified - she will clarify with pt he is to hold his eliquis 3 days before surgery.    VS: BP (!) 143/77   Pulse (!) 47   Temp 37.1 C (Oral)   Resp 16   Ht 5\' 10"  (1.778 m)   Wt 113.9 kg   SpO2 96%   BMI 36.01 kg/m   PROVIDERS: - PCP is Lajean Manes, MD - Cardiologist is [name], MD. Last office visit 07/22/20. Pt cleared for surgery at acceptable risk by Laurann Montana, NP on 09/12/20.   LABS: Labs reviewed: Acceptable for surgery. (all labs ordered are listed, but only abnormal results are displayed)  Labs Reviewed  HEMOGLOBIN A1C - Abnormal; Notable for the following components:      Result Value   Hgb A1c MFr Bld 5.8 (*)    All other components within normal limits  SURGICAL PCR SCREEN  CBC  COMPREHENSIVE METABOLIC PANEL  PROTIME-INR  APTT  TYPE AND SCREEN     EKG 09/18/20: Sinus bradycardia with 1st degree A-V block. Cannot rule out Anterior infarct , age undetermined. Appears stable compared to prior   CV: Echo 12/04/19:  1. Left ventricular ejection fraction, by estimation, is 60 to 65%. The left ventricle has normal function. The left ventricle  has no regional wall motion abnormalities. There is mild left ventricular hypertrophy. Left ventricular diastolic parameters were normal.  2. Right ventricular systolic function is normal. The right ventricular size is moderately enlarged.  3. Left atrial size was mildly dilated.  4. Right atrial size was mildly dilated.  5. The mitral valve is normal in structure. Trivial mitral valve regurgitation. No evidence of mitral stenosis.  6. The aortic valve is tricuspid. Aortic valve regurgitation is not visualized. No aortic stenosis is present.  7. Aortic dilatation noted. There is mild dilatation of the aortic root measuring 40 mm.  8. The inferior vena cava is normal in size with greater than 50% respiratory variability, suggesting right atrial pressure of 3 mmHg.    Long term cardiac event monitor (3-7 days) 01/26/18:   Predominant rhythm is sinus rhythm  Rare premature atrial contractions and premature ventricular contractions  Nonsustained atrial tachycardia is observed  No sustained arrhythmias   Nuclear stress test 02/18/16:   Nuclear stress EF: 60%.  Blood pressure demonstrated a normal response to exercise.  There was no ST segment deviation noted during stress.  The study is normal.  This is a low risk study.  The left ventricular ejection fraction is normal (55-65%).  Normal resting and stress perfusion. No ischemia or infarction EF 60%   Past Medical History:  Diagnosis Date  . Arthritis    "  comes and goes; mostly in hands, occasionally elbow" (04/05/2017)  . Complication of anesthesia    "just wanted to sleep alot  for couple days S/P VARICOCELE OR" (04/05/2017)  . DVT (deep venous thrombosis) (HCC)    LLE  . Dysrhythmia    PVCs   . GERD (gastroesophageal reflux disease)   . History of hiatal hernia   . Hypertension   . Impaired glucose tolerance   . Myeloma ([place])   . Obesity (BMI 30-39.9) 07/26/2016  . OSA on CPAP 07/26/2016   Mild with AHI  12.5/hr now on CPAP at 14cm H2O  . PAF (paroxysmal atrial fibrillation) ([place])    s/p PVI Ablation in 2018 // Flecainide Rx // Echo 8/21: EF 60-65, no RWMA, mild LVH, normal RV SF, moderate RVE, mild BAE, trivial MR, mild dilation of aortic root (40 mm)  . Pneumonia    "may have had walking pneumonia a few years ago" (04/05/2017)  . Pre-diabetes   . Thrombophlebitis     Past Surgical History:  Procedure Laterality Date  . ATRIAL FIBRILLATION ABLATION  04/05/2017  . ATRIAL FIBRILLATION ABLATION N/A 04/05/2017   Procedure: ATRIAL FIBRILLATION ABLATION;  Surgeon: Thompson Grayer, MD;  Location: [place] [facility];  Service: Cardiovascular;  Laterality: N/A;  . COMPLETE RIGHT HIP REPLACMENT  01/19/2019  . EVALUATION UNDER ANESTHESIA WITH HEMORRHOIDECTOMY N/A 02/24/2017   Procedure: EXAM UNDER ANESTHESIA WITH  HEMORRHOIDECTOMY;  Surgeon: Michael Boston, MD;  Location: [facility];  Service: General;  Laterality: N/A;  . FLEXIBLE SIGMOIDOSCOPY N/A 04/15/2015   Procedure:  UNSEDATED FLEXIBLE SIGMOIDOSCOPY;  Surgeon: [name], MD;  Location: [facility];  Service: Endoscopy;  Laterality: N/A;  . KNEE ARTHROSCOPY Left ~ 2016  . 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 TONSILLECTOMY    . VARICOSE VEIN SURGERY Bilateral     MEDICATIONS: . acetaminophen (TYLENOL) 500 MG tablet  . amLODipine (NORVASC) 10 MG tablet  . apixaban (ELIQUIS) 5 MG TABS tablet  . b complex vitamins tablet  . cholecalciferol (VITAMIN D3) 25 MCG (1000 UNIT) tablet  . cyanocobalamin (,VITAMIN B-12,) 1000 MCG/ML injection  . diltiazem (CARDIZEM) 30 MG tablet  . flecainide (TAMBOCOR) 50 MG tablet  . fluticasone (FLONASE) 50 MCG/ACT nasal spray  . folic acid (FOLVITE) 1 MG tablet  . gabapentin (NEURONTIN) 300 MG capsule  . Glucosamine-Chondroit-Vit C-Mn (GLUCOSAMINE 1500 COMPLEX) CAPS  . hydrochlorothiazide (HYDRODIURIL) 25 MG tablet  . lisinopril (ZESTRIL) 40 MG tablet  . loratadine (CLARITIN) 10 MG tablet  . METAMUCIL  FIBER PO  . Methotrexate Sodium (METHOTREXATE, PF,) 50 MG/2ML injection  . metoprolol succinate (TOPROL-XL) 100 MG 24 hr tablet  . Omega-3 Fatty Acids (FISH OIL) 1000 MG CAPS  . oxymetazoline (AFRIN) 0.05 % nasal spray  . potassium chloride SA (KLOR-CON) 20 MEQ tablet  . sodium chloride (OCEAN) 0.65 % SOLN nasal spray  . triamcinolone cream (KENALOG) 0.1 %  . Vitamin D, Ergocalciferol, (DRISDOL) 1.25 MG (50000 UT) CAPS capsule   No current facility-administered medications for this encounter.   Pt reported he will hold eliquis 2 days before surgery, however note from pharmacist states 3 days. Sherry in Dr. Aurea Graff office notified - she will clarify with pt he is to hold his eliquis 3 days before surgery.    If no changes, I anticipate pt can proceed with surgery as scheduled.   Willeen Cass, PhD, FNP-BC [facility] Short Stay Surgical Center/Anesthesiology Phone: [phone] 09/22/2020 11:12 AM

## 2020-09-22 NOTE — Anesthesia Preprocedure Evaluation (Addendum)
Anesthesia Evaluation  Patient identified by MRN, date of birth, ID band Patient awake    Reviewed: Allergy & Precautions, H&P , NPO status , Patient's Chart, lab work & pertinent test results  Airway Mallampati: II   Neck ROM: full    Dental   Pulmonary sleep apnea ,    breath sounds clear to auscultation       Cardiovascular hypertension, + dysrhythmias Atrial Fibrillation  Rhythm:regular Rate:Normal     Neuro/Psych    GI/Hepatic hiatal hernia, GERD  ,  Endo/Other    Renal/GU      Musculoskeletal  (+) Arthritis ,   Abdominal   Peds  Hematology   Anesthesia Other Findings   Reproductive/Obstetrics                            Anesthesia Physical Anesthesia Plan  ASA: III  Anesthesia Plan: Spinal and MAC   Post-op Pain Management:    Induction: Intravenous  PONV Risk Score and Plan: 1 and Ondansetron, Propofol infusion and Treatment may vary due to age or medical condition  Airway Management Planned: Simple Face Mask  Additional Equipment:   Intra-op Plan:   Post-operative Plan:   Informed Consent: I have reviewed the patients History and Physical, chart, labs and discussed the procedure including the risks, benefits and alternatives for the proposed anesthesia with the patient or authorized representative who has indicated his/her understanding and acceptance.     Dental advisory given  Plan Discussed with: CRNA, Anesthesiologist and Surgeon  Anesthesia Plan Comments: (See APP note by Durel Salts, FNP )       Anesthesia Quick Evaluation

## 2020-09-23 NOTE — H&P (Signed)
TOTAL HIP REVISION ADMISSION H&P  Patient is admitted for right total hip arthroplasty resection with antibiotic spacer placement.  Subjective:  Chief Complaint: Right hip prosthetic joint infection   HPI: Travis [name], 70 y.o. male, has a history of pain and functional disability in the right hip due to prostetic joint infection and patient has failed non-surgical conservative treatments for greater than 12 weeks to include activity modification.  He had primary THA in 2020 by Dr. Creig Hines. He was doing well until October 2021, when he began having increased pain. Lab results revelaed a CRP of 11 with a high normal of 8 and sedimentation rate of 28 with a high normal of 20. Right hip aspiration results revealed 15,000 white blood cells. His neutrophils were only 51. At the point of this evaluation there was no growth of bacteria on culture.  We discussed clinically his pain and dysfunction and the requirement of a cane. We correlated this with his laboratory studies as well as the aspiration results. Dr. Alvan Dame discussed with him that this needs to be treated as an infected right hip replacement. Two-stage treatment was recommended as opposed to a washout given the duration out from his surgery. We reviewed the implications of removal of hardware. We will do our best to have this performed with preservation of the proximal femur to be able to place a Prostalac articulating antibiotic spacer. We discussed duration of antibiotic treatment as well as follow-up studies including laboratory studies as well as a hip aspiration prior to proceeding with reimplantation. We discussed that this could take 4 to 6 months in order to treat adequately before proceeding with a primary reimplantation. At this point surgery will be scheduled for resection of his right hip with a possible need for an extended trochanteric osteotomy with placement of an articulating antibiotic spacer, Prostalac.The patient was in agreement  with this plan.  Patient Active Problem List   Diagnosis Date Noted  . PAF (paroxysmal atrial fibrillation) ([place])   . GERD (gastroesophageal reflux disease) 03/29/2018  . Hiatal hernia 03/29/2018  . History of DVT (deep vein thrombosis) 03/29/2018  . Hospital acquired PNA 03/29/2018  . Osteoarthritis 03/29/2018  . Thrombophlebitis 03/29/2018  . Neuropathy 08/29/2017  . 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 Paroxysmal atrial fibrillation ([place]) 04/05/2017  . Prolapsed internal hemorrhoids, grade 4, s/p ligation/pexy/hemorrhoidectomy x 2 02/24/2017 02/24/2017  . OSA (obstructive sleep apnea) 07/26/2016  . Obesity (BMI 30-39.9) 07/26/2016  . Atrial fibrillation ([place])   . Hypertension   . Chest pain at rest    Past Medical History:  Diagnosis Date  . Arthritis    "comes and goes; mostly in hands, occasionally elbow" (04/05/2017)  . Complication of anesthesia    "just wanted to sleep alot  for couple days S/P VARICOCELE OR" (04/05/2017)  . DVT (deep venous thrombosis) ([facility])    LLE  . Dysrhythmia    PVCs   . GERD (gastroesophageal reflux disease)   . History of hiatal hernia   . Hypertension   . Impaired glucose tolerance   . Myeloma ([place])   . Obesity (BMI 30-39.9) 07/26/2016  . OSA on CPAP 07/26/2016   Mild with AHI 12.5/hr now on CPAP at 14cm H2O  . PAF (paroxysmal atrial fibrillation) ([place])    s/p PVI Ablation in 2018 // Flecainide Rx // Echo 8/21: EF 60-65, no RWMA, mild LVH, normal RV SF, moderate RVE, mild BAE, trivial MR, mild dilation of aortic root (40 mm)  . Pneumonia    "  may have had walking pneumonia a few years ago" (04/05/2017)  . Pre-diabetes   . Thrombophlebitis     Past Surgical History:  Procedure Laterality Date  . ATRIAL FIBRILLATION ABLATION  04/05/2017  . ATRIAL FIBRILLATION ABLATION N/A 04/05/2017   Procedure: ATRIAL FIBRILLATION ABLATION;  Surgeon: Thompson Grayer, MD;  Location: [facility];  Service: Cardiovascular;  Laterality: N/A;  .  COMPLETE RIGHT HIP REPLACMENT  01/19/2019  . EVALUATION UNDER ANESTHESIA WITH HEMORRHOIDECTOMY N/A 02/24/2017   Procedure: EXAM UNDER ANESTHESIA WITH  HEMORRHOIDECTOMY;  Surgeon: Michael Boston, MD;  Location: [facility];  Service: General;  Laterality: N/A;  . FLEXIBLE SIGMOIDOSCOPY N/A 04/15/2015   Procedure:  UNSEDATED FLEXIBLE SIGMOIDOSCOPY;  Surgeon: [name], MD;  Location: [facility];  Service: Endoscopy;  Laterality: N/A;  . KNEE ARTHROSCOPY Left ~ 2016  . 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 TONSILLECTOMY    . VARICOSE VEIN SURGERY Bilateral     No current facility-administered medications for this encounter.   Current Outpatient Medications  Medication Sig Dispense Refill Last Dose  . acetaminophen (TYLENOL) 500 MG tablet Take 1,000 mg by mouth every 6 (six) hours as needed for moderate pain.     [name] amLODipine (NORVASC) 10 MG tablet Take 10 mg by mouth daily.      [name] apixaban (ELIQUIS) 5 MG TABS tablet Take 1 tablet (5 mg total) by mouth 2 (two) times daily. 60 tablet 10   . b complex vitamins tablet Take 1 tablet by mouth daily.     . cholecalciferol (VITAMIN D3) 25 MCG (1000 UNIT) tablet Take 1,000 Units by mouth daily.     . cyanocobalamin (,VITAMIN B-12,) 1000 MCG/ML injection Inject 1,000 mcg into the skin every 30 (thirty) days.     [name] diltiazem (CARDIZEM) 30 MG tablet Take 1 Tablet Every 4 Hours As Needed For Afib HR Over 100. If using frequently please call our office [phone] (Patient taking differently: Take 30 mg by mouth every 4 (four) hours as needed (For Afib HR Over 100). If using frequently please call our office [phone]) 30 tablet 1   . flecainide (TAMBOCOR) 50 MG tablet Take 1.5 tablets (75 mg total) by mouth 2 (two) times daily. 270 tablet 3   . fluticasone (FLONASE) 50 MCG/ACT nasal spray Place 1 spray into both nostrils daily as needed for allergies or rhinitis.     . folic acid (FOLVITE) 1 MG tablet Take 1 mg by mouth daily.     .  Glucosamine-Chondroit-Vit C-Mn (GLUCOSAMINE 1500 COMPLEX) CAPS Take 1 capsule by mouth See admin instructions. Take 1 capsule daily for 1 week then off for 1 week then repeat     . hydrochlorothiazide (HYDRODIURIL) 25 MG tablet Take 1 tablet (25 mg total) by mouth daily. 90 tablet 3   . lisinopril (ZESTRIL) 40 MG tablet TAKE ONE TABLET BY MOUTH ONE TIME DAILY (Patient taking differently: Take 40 mg by mouth daily.) 90 tablet 3   . loratadine (CLARITIN) 10 MG tablet Take 10 mg by mouth at bedtime as needed for allergies.     [name] METAMUCIL FIBER PO Take 1 Dose by mouth at bedtime. 1 dose = 1 teaspoonful     . metoprolol succinate (TOPROL-XL) 100 MG 24 hr tablet Take 100 mg by mouth daily.      . Omega-3 Fatty Acids (FISH OIL) 1000 MG CAPS Take 1,000 mg by mouth daily.     [name] oxymetazoline (AFRIN) 0.05 % nasal spray Place  1 spray into both nostrils 2 (two) times daily as needed for congestion.     . potassium chloride SA (KLOR-CON) 20 MEQ tablet Take 1 tablet (20 mEq total) by mouth daily. 90 tablet 3   . sodium chloride (OCEAN) 0.65 % SOLN nasal spray Place 1 spray into both nostrils as needed for congestion.     . triamcinolone cream (KENALOG) 0.1 % Apply 1 application topically daily as needed (itching).     . gabapentin (NEURONTIN) 300 MG capsule TAKE TWO CAPSULES BY MOUTH TWICE A DAY 180 capsule 3   . Methotrexate Sodium (METHOTREXATE, PF,) 50 MG/2ML injection Inject 37.5 mg into the muscle every Friday. 1.5 ml     . Vitamin D, Ergocalciferol, (DRISDOL) 1.25 MG (50000 UT) CAPS capsule TAKE ONE CAPSULE BY MOUTH ONCE WEEKLY (Patient not taking: No sig reported) 4 capsule 3 Completed Course at Unknown time   No Known Allergies  Social History   Tobacco Use  . Smoking status: Never Smoker  . Smokeless tobacco: Never Used  Substance Use Topics  . Alcohol use: Yes    Alcohol/week: 14.0 standard drinks    Types: 14 Glasses of wine per week    Comment: wine on weekend     Family History  Problem  Relation Age of Onset  . Dementia Mother   . Stroke Father   . Atrial fibrillation Father   . Hypertension Father   . Hypertension Paternal Grandfather   . Dementia Maternal Grandmother       Review of Systems  Constitutional: Negative for chills and fever.  Respiratory: Negative for cough and shortness of breath.   Cardiovascular: Negative for chest pain.  Gastrointestinal: Negative for nausea and vomiting.  Musculoskeletal: Positive for arthralgias.    Objective:  Physical Exam Well nourished and well developed. General: Alert and oriented x3, cooperative and pleasant, no acute distress. Head: normocephalic, atraumatic, neck supple. Eyes: EOMI.  Musculoskeletal: Right hip exam: No significant erythema around his incision Continued tenderness over the anterior lateral aspect of the hip Weakness with active hip flexion over 100 degrees as well as weakness with abduction both at 4+/5 Passive range of motion produces tightness and mild discomfort with hip flexion internal rotation about 20 degrees external rotation to 20 to 30 degrees.  Calves soft and nontender. Motor function intact in LE. Strength 5/5 LE bilaterally. Neuro: Distal pulses 2+. Sensation to light touch intact in LE.  Vital signs in last 24 hours:    Labs:   Estimated body mass index is 36.01 kg/m as calculated from the following:   Height as of 09/18/20: _0  (1.778 m).   Weight as of 09/18/20: 113.9 kg.  Imaging Review:  Plain radiographs demonstrate what appear to be stable and well fixed femoral and acetabular components without evidence of significant periprosthetic osteolysis or other complications or concerns.    Assessment/Plan:  Prosthetic joint infection, right total hip   The patient history, physical examination, clinical judgement of the provider and imaging studies are consistent with Prosthetic joint infection of the right hip(s), previous total hip arthroplasty. Resection total  hip arthroplasty is deemed medically necessary. The treatment options including medical management, injection therapy, arthroscopy and arthroplasty were discussed at length. The risks and benefits of total hip arthroplasty were presented and reviewed. The risks due to aseptic loosening, infection, stiffness, dislocation/subluxation,  thromboembolic complications and other imponderables were discussed.  The patient acknowledged the explanation, agreed to proceed with the plan and consent was signed. Patient is  being admitted for inpatient treatment for surgery, pain control, PT, OT, prophylactic antibiotics, VTE prophylaxis, progressive ambulation and ADL's and discharge planning. The patient is planning to be discharged home.   Therapy Plans: HEP Disposition: Home with wife Planned DVT Prophylaxis: Eliquis 5 mg BID DME needed: walker PCP: Dr. Felipa Eth Cardiologist: [name] Rheumatologist: Oren Section TXA: IV Allergies: NKDA Anesthesia Concerns: none BMI: 37.6 Not diabetic.  Other: - A fib , on Eliquis - s/p ablation in 2018 - Hydrocodone is okay - Stopped Methotrexate beginning of May  Griffith Citron, PA-C Orthopedic Surgery [facility] [place] [phone]

## 2020-09-25 ENCOUNTER — Encounter (HOSPITAL_COMMUNITY): Payer: Self-pay | Admitting: Orthopedic Surgery

## 2020-09-25 ENCOUNTER — Inpatient Hospital Stay (HOSPITAL_COMMUNITY): Payer: Medicare Other | Admitting: Anesthesiology

## 2020-09-25 ENCOUNTER — Encounter (HOSPITAL_COMMUNITY)
Admission: RE | Disposition: A | Discharge status: Home Health | Payer: Self-pay | Source: Home / Self Care | Attending: Orthopedic Surgery

## 2020-09-25 ENCOUNTER — Inpatient Hospital Stay: Payer: Self-pay

## 2020-09-25 ENCOUNTER — Inpatient Hospital Stay (HOSPITAL_COMMUNITY): Payer: Medicare Other

## 2020-09-25 ENCOUNTER — Inpatient Hospital Stay (HOSPITAL_COMMUNITY): Payer: Medicare Other | Admitting: Physician Assistant

## 2020-09-25 ENCOUNTER — Inpatient Hospital Stay (HOSPITAL_COMMUNITY)
Admission: RE | Admit: 2020-09-25 | Discharge: 2020-09-27 | DRG: 467 | Disposition: A | Discharge status: Home Health | Payer: Medicare Other | Attending: Orthopedic Surgery | Admitting: Orthopedic Surgery

## 2020-09-25 DIAGNOSIS — I493 Ventricular premature depolarization: Secondary | ICD-10-CM | POA: Diagnosis not present

## 2020-09-25 DIAGNOSIS — Z96641 Presence of right artificial hip joint: Secondary | ICD-10-CM | POA: Diagnosis not present

## 2020-09-25 DIAGNOSIS — I10 Essential (primary) hypertension: Secondary | ICD-10-CM | POA: Diagnosis present

## 2020-09-25 DIAGNOSIS — M1990 Unspecified osteoarthritis, unspecified site: Secondary | ICD-10-CM | POA: Diagnosis present

## 2020-09-25 DIAGNOSIS — Y792 Prosthetic and other implants, materials and accessory orthopedic devices associated with adverse incidents: Secondary | ICD-10-CM | POA: Diagnosis present

## 2020-09-25 DIAGNOSIS — G4733 Obstructive sleep apnea (adult) (pediatric): Secondary | ICD-10-CM | POA: Diagnosis present

## 2020-09-25 DIAGNOSIS — Z6837 Body mass index (BMI) 37.0-37.9, adult: Secondary | ICD-10-CM

## 2020-09-25 DIAGNOSIS — Z9889 Other specified postprocedural states: Secondary | ICD-10-CM | POA: Diagnosis not present

## 2020-09-25 DIAGNOSIS — Z8672 Personal history of thrombophlebitis: Secondary | ICD-10-CM | POA: Diagnosis not present

## 2020-09-25 DIAGNOSIS — Z96649 Presence of unspecified artificial hip joint: Secondary | ICD-10-CM

## 2020-09-25 DIAGNOSIS — Z8249 Family history of ischemic heart disease and other diseases of the circulatory system: Secondary | ICD-10-CM | POA: Diagnosis not present

## 2020-09-25 DIAGNOSIS — Z823 Family history of stroke: Secondary | ICD-10-CM

## 2020-09-25 DIAGNOSIS — R600 Localized edema: Secondary | ICD-10-CM | POA: Diagnosis not present

## 2020-09-25 DIAGNOSIS — Z86718 Personal history of other venous thrombosis and embolism: Secondary | ICD-10-CM | POA: Diagnosis not present

## 2020-09-25 DIAGNOSIS — C9000 Multiple myeloma not having achieved remission: Secondary | ICD-10-CM | POA: Diagnosis present

## 2020-09-25 DIAGNOSIS — E669 Obesity, unspecified: Secondary | ICD-10-CM | POA: Diagnosis present

## 2020-09-25 DIAGNOSIS — Z20822 Contact with and (suspected) exposure to covid-19: Secondary | ICD-10-CM | POA: Diagnosis present

## 2020-09-25 DIAGNOSIS — Z471 Aftercare following joint replacement surgery: Secondary | ICD-10-CM | POA: Diagnosis not present

## 2020-09-25 DIAGNOSIS — T8451XA Infection and inflammatory reaction due to internal right hip prosthesis, initial encounter: Secondary | ICD-10-CM | POA: Diagnosis present

## 2020-09-25 DIAGNOSIS — I480 Paroxysmal atrial fibrillation: Secondary | ICD-10-CM | POA: Diagnosis present

## 2020-09-25 DIAGNOSIS — Z79899 Other long term (current) drug therapy: Secondary | ICD-10-CM

## 2020-09-25 DIAGNOSIS — Z7901 Long term (current) use of anticoagulants: Secondary | ICD-10-CM | POA: Diagnosis not present

## 2020-09-25 DIAGNOSIS — K219 Gastro-esophageal reflux disease without esophagitis: Secondary | ICD-10-CM | POA: Diagnosis present

## 2020-09-25 DIAGNOSIS — R7303 Prediabetes: Secondary | ICD-10-CM | POA: Diagnosis not present

## 2020-09-25 HISTORY — PX: EXCISIONAL TOTAL HIP ARTHROPLASTY WITH ANTIBIOTIC SPACERS: SHX5826

## 2020-09-25 LAB — SYNOVIAL CELL COUNT + DIFF, W/ CRYSTALS
Crystals, Fluid: NONE SEEN
WBC, Synovial: UNDETERMINED /mm3 (ref 0–200)

## 2020-09-25 LAB — TYPE AND SCREEN
ABO/RH(D): O POS
Antibody Screen: NEGATIVE

## 2020-09-25 SURGERY — EXCISIONAL TOTAL HIP ARTHROPLASTY WITH ANTIBIOTIC SPACERS
Anesthesia: Monitor Anesthesia Care | Site: Hip | Laterality: Right

## 2020-09-25 MED ORDER — FLUTICASONE PROPIONATE 50 MCG/ACT NA SUSP: 1.0000 | Freq: Every day | NASAL | Status: DC | PRN

## 2020-09-25 MED ORDER — TRANEXAMIC ACID-NACL 1000-0.7 MG/100ML-% IV SOLN
1000.0000 mg | INTRAVENOUS | Status: AC
  Administered 2020-09-25: 1000 mg via INTRAVENOUS
  Filled 2020-09-25: qty 100

## 2020-09-25 MED ORDER — AMLODIPINE BESYLATE 10 MG PO TABS
10.0000 mg | ORAL_TABLET | Freq: Every day | ORAL | Status: DC
  Administered 2020-09-26 – 2020-09-27 (×2): 10 mg via ORAL
  Filled 2020-09-25 (×2): qty 1

## 2020-09-25 MED ORDER — ONDANSETRON HCL 4 MG/2ML IJ SOLN
4.0000 mg | Freq: Four times a day (QID) | INTRAMUSCULAR | Status: DC | PRN
Start: 2020-09-25 — End: 2020-09-25

## 2020-09-25 MED ORDER — SODIUM CHLORIDE 0.9 % IV SOLN: INTRAVENOUS | Status: DC

## 2020-09-25 MED ORDER — VANCOMYCIN HCL 1000 MG IV SOLR
INTRAVENOUS | Status: AC
  Filled 2020-09-25: qty 1000

## 2020-09-25 MED ORDER — HYDROCHLOROTHIAZIDE 25 MG PO TABS
25.0000 mg | ORAL_TABLET | Freq: Every day | ORAL | Status: DC
  Administered 2020-09-26 – 2020-09-27 (×2): 25 mg via ORAL
  Filled 2020-09-25 (×2): qty 1

## 2020-09-25 MED ORDER — FENTANYL CITRATE (PF) 100 MCG/2ML IJ SOLN
INTRAMUSCULAR | Status: AC
  Administered 2020-09-25: 50 ug via INTRAVENOUS
  Filled 2020-09-25: qty 2

## 2020-09-25 MED ORDER — FENTANYL CITRATE (PF) 100 MCG/2ML IJ SOLN
INTRAMUSCULAR | Status: AC
  Filled 2020-09-25: qty 2

## 2020-09-25 MED ORDER — SALINE SPRAY 0.65 % NA SOLN: 1.0000 | NASAL | Status: DC | PRN

## 2020-09-25 MED ORDER — HYDROCODONE-ACETAMINOPHEN 5-325 MG PO TABS
1.0000 | ORAL_TABLET | ORAL | Status: DC | PRN
  Administered 2020-09-25 – 2020-09-27 (×5): 2 via ORAL
  Filled 2020-09-25 (×5): qty 2

## 2020-09-25 MED ORDER — MENTHOL 3 MG MT LOZG: 1.0000 | LOZENGE | OROMUCOSAL | Status: DC | PRN

## 2020-09-25 MED ORDER — OXYMETAZOLINE HCL 0.05 % NA SOLN: 1.0000 | Freq: Two times a day (BID) | NASAL | Status: DC | PRN

## 2020-09-25 MED ORDER — DEXAMETHASONE SODIUM PHOSPHATE 10 MG/ML IJ SOLN
INTRAMUSCULAR | Status: AC
  Filled 2020-09-25: qty 1

## 2020-09-25 MED ORDER — EPHEDRINE SULFATE-NACL 50-0.9 MG/10ML-% IV SOSY
PREFILLED_SYRINGE | INTRAVENOUS | Status: DC | PRN
  Administered 2020-09-25 (×4): 10 mg via INTRAVENOUS

## 2020-09-25 MED ORDER — TOBRAMYCIN SULFATE 1.2 G IJ SOLR
INTRAMUSCULAR | Status: DC | PRN
  Administered 2020-09-25: 4.8 g

## 2020-09-25 MED ORDER — METOPROLOL SUCCINATE ER 50 MG PO TB24
100.0000 mg | ORAL_TABLET | Freq: Every day | ORAL | Status: DC
  Administered 2020-09-26 – 2020-09-27 (×2): 100 mg via ORAL
  Filled 2020-09-25 (×2): qty 2

## 2020-09-25 MED ORDER — METHOCARBAMOL 500 MG IVPB - SIMPLE MED
INTRAVENOUS | Status: AC
  Filled 2020-09-25: qty 50

## 2020-09-25 MED ORDER — BISACODYL 10 MG RE SUPP: 10.0000 mg | Freq: Every day | RECTAL | Status: DC | PRN

## 2020-09-25 MED ORDER — FERROUS SULFATE 325 (65 FE) MG PO TABS
325.0000 mg | ORAL_TABLET | Freq: Three times a day (TID) | ORAL | Status: DC
  Administered 2020-09-25 – 2020-09-27 (×6): 325 mg via ORAL
  Filled 2020-09-25 (×6): qty 1

## 2020-09-25 MED ORDER — POVIDONE-IODINE 10 % EX SWAB
2.0000 | Freq: Once | CUTANEOUS | Status: AC
Start: 2020-09-25 — End: 2020-09-25
  Administered 2020-09-25: 2 via TOPICAL

## 2020-09-25 MED ORDER — TOBRAMYCIN SULFATE 1.2 G IJ SOLR
INTRAMUSCULAR | Status: AC
  Filled 2020-09-25: qty 6

## 2020-09-25 MED ORDER — OXYCODONE HCL 5 MG PO TABS: 5.0000 mg | ORAL_TABLET | ORAL | Status: AC | PRN

## 2020-09-25 MED ORDER — FENTANYL CITRATE (PF) 100 MCG/2ML IJ SOLN
INTRAMUSCULAR | Status: AC
  Administered 2020-09-25: 100 ug
  Filled 2020-09-25: qty 2

## 2020-09-25 MED ORDER — OXYCODONE HCL 5 MG PO TABS
ORAL_TABLET | ORAL | Status: AC
  Administered 2020-09-25: 5 mg via ORAL
  Filled 2020-09-25: qty 1

## 2020-09-25 MED ORDER — STERILE WATER FOR IRRIGATION IR SOLN
Status: DC | PRN
  Administered 2020-09-25: 2000 mL

## 2020-09-25 MED ORDER — HYDROMORPHONE HCL 1 MG/ML IJ SOLN
0.5000 mg | INTRAMUSCULAR | Status: DC | PRN
  Administered 2020-09-25: 0.5 mg via INTRAVENOUS

## 2020-09-25 MED ORDER — METHOCARBAMOL 500 MG IVPB - SIMPLE MED
500.0000 mg | Freq: Four times a day (QID) | INTRAVENOUS | Status: DC | PRN
  Administered 2020-09-25: 500 mg via INTRAVENOUS
  Filled 2020-09-25: qty 50

## 2020-09-25 MED ORDER — ONDANSETRON HCL 4 MG/2ML IJ SOLN: 4.0000 mg | Freq: Four times a day (QID) | INTRAMUSCULAR | Status: DC | PRN

## 2020-09-25 MED ORDER — CEFAZOLIN SODIUM-DEXTROSE 2-4 GM/100ML-% IV SOLN
2.0000 g | INTRAVENOUS | Status: AC
Start: 2020-09-25 — End: 2020-09-25
  Administered 2020-09-25: 2 g via INTRAVENOUS
  Filled 2020-09-25: qty 100

## 2020-09-25 MED ORDER — METOCLOPRAMIDE HCL 5 MG/ML IJ SOLN: 5.0000 mg | Freq: Three times a day (TID) | INTRAMUSCULAR | Status: DC | PRN

## 2020-09-25 MED ORDER — LORATADINE 10 MG PO TABS: 10.0000 mg | ORAL_TABLET | Freq: Every evening | ORAL | Status: DC | PRN

## 2020-09-25 MED ORDER — PSYLLIUM 95 % PO PACK
1.0000 | PACK | Freq: Every day | ORAL | Status: DC
  Administered 2020-09-25 – 2020-09-26 (×2): 1 via ORAL
  Filled 2020-09-25 (×3): qty 1

## 2020-09-25 MED ORDER — ACETAMINOPHEN 325 MG PO TABS: 325.0000 mg | ORAL_TABLET | Freq: Four times a day (QID) | ORAL | Status: DC | PRN

## 2020-09-25 MED ORDER — PROPOFOL 500 MG/50ML IV EMUL
INTRAVENOUS | Status: AC
  Filled 2020-09-25: qty 50

## 2020-09-25 MED ORDER — HYDROCODONE-ACETAMINOPHEN 7.5-325 MG PO TABS
1.0000 | ORAL_TABLET | ORAL | Status: DC | PRN
  Administered 2020-09-25: 2 via ORAL
  Administered 2020-09-27: 1 via ORAL
  Filled 2020-09-25: qty 1
  Filled 2020-09-25: qty 2

## 2020-09-25 MED ORDER — METHOCARBAMOL 500 MG PO TABS
500.0000 mg | ORAL_TABLET | Freq: Four times a day (QID) | ORAL | Status: DC | PRN
  Administered 2020-09-27 (×2): 500 mg via ORAL
  Filled 2020-09-25 (×2): qty 1

## 2020-09-25 MED ORDER — HYDROMORPHONE HCL 1 MG/ML IJ SOLN: 0.5000 mg | INTRAMUSCULAR | Status: DC | PRN

## 2020-09-25 MED ORDER — LACTATED RINGERS IV SOLN: INTRAVENOUS | Status: DC

## 2020-09-25 MED ORDER — HYDROMORPHONE HCL 1 MG/ML IJ SOLN
INTRAMUSCULAR | Status: AC
  Administered 2020-09-25: 0.5 mg via INTRAVENOUS
  Filled 2020-09-25: qty 1

## 2020-09-25 MED ORDER — VANCOMYCIN HCL 1000 MG IV SOLR
INTRAVENOUS | Status: AC
  Filled 2020-09-25: qty 5000

## 2020-09-25 MED ORDER — 0.9 % SODIUM CHLORIDE (POUR BTL) OPTIME
TOPICAL | Status: DC | PRN
  Administered 2020-09-25: 1000 mL

## 2020-09-25 MED ORDER — PHENOL 1.4 % MT LIQD: 1.0000 | OROMUCOSAL | Status: DC | PRN

## 2020-09-25 MED ORDER — VANCOMYCIN HCL 1000 MG IV SOLR
INTRAVENOUS | Status: DC | PRN
  Administered 2020-09-25: 1000 mg via TOPICAL
  Administered 2020-09-25: 4000 mg

## 2020-09-25 MED ORDER — APIXABAN 2.5 MG PO TABS
2.5000 mg | ORAL_TABLET | Freq: Two times a day (BID) | ORAL | Status: DC
  Administered 2020-09-26 – 2020-09-27 (×3): 2.5 mg via ORAL
  Filled 2020-09-25 (×3): qty 1

## 2020-09-25 MED ORDER — LISINOPRIL 20 MG PO TABS
40.0000 mg | ORAL_TABLET | Freq: Every day | ORAL | Status: DC
  Administered 2020-09-26 – 2020-09-27 (×2): 40 mg via ORAL
  Filled 2020-09-25 (×2): qty 2

## 2020-09-25 MED ORDER — FENTANYL CITRATE (PF) 100 MCG/2ML IJ SOLN: 50.0000 ug | Freq: Once | INTRAMUSCULAR | Status: AC

## 2020-09-25 MED ORDER — ONDANSETRON HCL 4 MG/2ML IJ SOLN
INTRAMUSCULAR | Status: DC | PRN
  Administered 2020-09-25: 4 mg via INTRAVENOUS

## 2020-09-25 MED ORDER — PROPOFOL 500 MG/50ML IV EMUL
INTRAVENOUS | Status: DC | PRN
  Administered 2020-09-25: 50 ug/kg/min via INTRAVENOUS

## 2020-09-25 MED ORDER — GABAPENTIN 300 MG PO CAPS
600.0000 mg | ORAL_CAPSULE | Freq: Two times a day (BID) | ORAL | Status: DC
  Administered 2020-09-25 – 2020-09-27 (×4): 600 mg via ORAL
  Filled 2020-09-25 (×4): qty 2

## 2020-09-25 MED ORDER — TRANEXAMIC ACID-NACL 1000-0.7 MG/100ML-% IV SOLN
1000.0000 mg | Freq: Once | INTRAVENOUS | Status: AC
  Administered 2020-09-25: 1000 mg via INTRAVENOUS
  Filled 2020-09-25: qty 100

## 2020-09-25 MED ORDER — METOCLOPRAMIDE HCL 5 MG PO TABS: 5.0000 mg | ORAL_TABLET | Freq: Three times a day (TID) | ORAL | Status: DC | PRN

## 2020-09-25 MED ORDER — ONDANSETRON HCL 4 MG PO TABS: 4.0000 mg | ORAL_TABLET | Freq: Four times a day (QID) | ORAL | Status: DC | PRN

## 2020-09-25 MED ORDER — EPHEDRINE 5 MG/ML INJ
INTRAVENOUS | Status: AC
  Filled 2020-09-25: qty 10

## 2020-09-25 MED ORDER — POLYETHYLENE GLYCOL 3350 17 G PO PACK
17.0000 g | PACK | Freq: Every day | ORAL | Status: DC | PRN
  Administered 2020-09-27: 17 g via ORAL
  Filled 2020-09-25: qty 1

## 2020-09-25 MED ORDER — PROPOFOL 10 MG/ML IV BOLUS
INTRAVENOUS | Status: AC
  Filled 2020-09-25: qty 20

## 2020-09-25 MED ORDER — DOCUSATE SODIUM 100 MG PO CAPS
100.0000 mg | ORAL_CAPSULE | Freq: Two times a day (BID) | ORAL | Status: DC
  Administered 2020-09-25 – 2020-09-27 (×4): 100 mg via ORAL
  Filled 2020-09-25 (×4): qty 1

## 2020-09-25 MED ORDER — MORPHINE SULFATE (PF) 2 MG/ML IV SOLN
0.5000 mg | INTRAVENOUS | Status: DC | PRN
  Administered 2020-09-25: 1 mg via INTRAVENOUS
  Filled 2020-09-25: qty 1

## 2020-09-25 MED ORDER — PROPOFOL 10 MG/ML IV BOLUS
INTRAVENOUS | Status: DC | PRN
  Administered 2020-09-25: 20 mg via INTRAVENOUS
  Administered 2020-09-25: 30 mg via INTRAVENOUS
  Administered 2020-09-25: 20 mg via INTRAVENOUS

## 2020-09-25 MED ORDER — FENTANYL CITRATE (PF) 100 MCG/2ML IJ SOLN
25.0000 ug | INTRAMUSCULAR | Status: DC | PRN
  Administered 2020-09-25: 50 ug via INTRAVENOUS

## 2020-09-25 MED ORDER — DEXAMETHASONE SODIUM PHOSPHATE 10 MG/ML IJ SOLN
8.0000 mg | Freq: Once | INTRAMUSCULAR | Status: AC
  Administered 2020-09-25: 8 mg via INTRAVENOUS

## 2020-09-25 MED ORDER — MIDAZOLAM HCL 5 MG/5ML IJ SOLN
INTRAMUSCULAR | Status: DC | PRN
  Administered 2020-09-25 (×2): 1 mg via INTRAVENOUS

## 2020-09-25 MED ORDER — POTASSIUM CHLORIDE CRYS ER 20 MEQ PO TBCR
20.0000 meq | EXTENDED_RELEASE_TABLET | Freq: Every day | ORAL | Status: DC
  Administered 2020-09-26 – 2020-09-27 (×2): 20 meq via ORAL
  Filled 2020-09-25 (×2): qty 1

## 2020-09-25 MED ORDER — DEXAMETHASONE SODIUM PHOSPHATE 10 MG/ML IJ SOLN
10.0000 mg | Freq: Once | INTRAMUSCULAR | Status: AC
  Administered 2020-09-26: 10 mg via INTRAVENOUS
  Filled 2020-09-25: qty 1

## 2020-09-25 MED ORDER — CEFAZOLIN SODIUM-DEXTROSE 2-4 GM/100ML-% IV SOLN
2.0000 g | Freq: Four times a day (QID) | INTRAVENOUS | Status: AC
  Administered 2020-09-25 (×2): 2 g via INTRAVENOUS
  Filled 2020-09-25 (×2): qty 100

## 2020-09-25 MED ORDER — DILTIAZEM HCL 30 MG PO TABS
30.0000 mg | ORAL_TABLET | ORAL | Status: DC | PRN
Start: 2020-09-25 — End: 2020-09-27
  Filled 2020-09-25: qty 1

## 2020-09-25 MED ORDER — OXYCODONE HCL 5 MG/5ML PO SOLN: 5.0000 mg | Freq: Once | ORAL | Status: DC | PRN

## 2020-09-25 MED ORDER — FLECAINIDE ACETATE 50 MG PO TABS
75.0000 mg | ORAL_TABLET | Freq: Two times a day (BID) | ORAL | Status: DC
  Administered 2020-09-25 – 2020-09-27 (×4): 75 mg via ORAL
  Filled 2020-09-25 (×5): qty 2

## 2020-09-25 MED ORDER — BUPIVACAINE IN DEXTROSE 0.75-8.25 % IT SOLN
INTRATHECAL | Status: DC | PRN
  Administered 2020-09-25: 2 mL via INTRATHECAL

## 2020-09-25 MED ORDER — MIDAZOLAM HCL 2 MG/2ML IJ SOLN
INTRAMUSCULAR | Status: AC
  Filled 2020-09-25: qty 2

## 2020-09-25 MED ORDER — DIPHENHYDRAMINE HCL 12.5 MG/5ML PO ELIX: 12.5000 mg | ORAL_SOLUTION | ORAL | Status: DC | PRN

## 2020-09-25 MED ORDER — ONDANSETRON HCL 4 MG/2ML IJ SOLN
INTRAMUSCULAR | Status: AC
  Filled 2020-09-25: qty 2

## 2020-09-25 MED ORDER — FENTANYL CITRATE (PF) 100 MCG/2ML IJ SOLN
INTRAMUSCULAR | Status: DC | PRN
  Administered 2020-09-25: 50 ug via INTRAVENOUS
  Administered 2020-09-25: 100 ug via INTRAVENOUS
  Administered 2020-09-25: 50 ug via INTRAVENOUS

## 2020-09-25 MED ORDER — OXYCODONE HCL 5 MG PO TABS
5.0000 mg | ORAL_TABLET | Freq: Once | ORAL | Status: DC | PRN
Start: 2020-09-25 — End: 2020-09-25

## 2020-09-25 SURGICAL SUPPLY — 73 items
ADH SKN CLS APL DERMABOND .7 (GAUZE/BANDAGES/DRESSINGS) ×2
BAG SPEC THK2 15X12 ZIP CLS (MISCELLANEOUS) ×1
BAG ZIPLOCK 12X15 (MISCELLANEOUS) ×2 IMPLANT
BLADE EXPLANT LG 52 (BLADE) ×1 IMPLANT
BLADE EXPLANT TRUN SM 52 (BLADE) ×1 IMPLANT
BLADE SAW SGTL 11.0X1.19X90.0M (BLADE) IMPLANT
BLADE SAW SGTL 18X1.27X75 (BLADE) ×2 IMPLANT
BRUSH FEMORAL CANAL (MISCELLANEOUS) ×2 IMPLANT
CEMENT HV SMART SET (Cement) ×5 IMPLANT
CNTNR URN SCR LID CUP LEK RST (MISCELLANEOUS) IMPLANT
CONT SPEC 4OZ STRL OR WHT (MISCELLANEOUS) ×2
COVER SURGICAL LIGHT HANDLE (MISCELLANEOUS) ×2 IMPLANT
COVER WAND RF STERILE (DRAPES) IMPLANT
DERMABOND ADVANCED (GAUZE/BANDAGES/DRESSINGS) ×2
DERMABOND ADVANCED .7 DNX12 (GAUZE/BANDAGES/DRESSINGS) IMPLANT
DRAPE ORTHO SPLIT 77X108 STRL (DRAPES) ×4
DRAPE POUCH INSTRU U-SHP 10X18 (DRAPES) ×2 IMPLANT
DRAPE SURG 17X11 SM STRL (DRAPES) ×2 IMPLANT
DRAPE SURG ORHT 6 SPLT 77X108 (DRAPES) ×2 IMPLANT
DRAPE U-SHAPE 47X51 STRL (DRAPES) ×2 IMPLANT
DRESSING AQUACEL AG SP 3.5X10 (GAUZE/BANDAGES/DRESSINGS) IMPLANT
DRESSING MEPILEX FLEX 4X4 (GAUZE/BANDAGES/DRESSINGS) ×1 IMPLANT
DRSG AQUACEL AG ADV 3.5X10 (GAUZE/BANDAGES/DRESSINGS) ×1 IMPLANT
DRSG AQUACEL AG ADV 3.5X14 (GAUZE/BANDAGES/DRESSINGS) ×1 IMPLANT
DRSG AQUACEL AG SP 3.5X10 (GAUZE/BANDAGES/DRESSINGS)
DRSG EMULSION OIL 3X16 NADH (GAUZE/BANDAGES/DRESSINGS) ×2 IMPLANT
DRSG MEPILEX BORDER 4X8 (GAUZE/BANDAGES/DRESSINGS) ×2 IMPLANT
DRSG MEPILEX FLEX 4X4 (GAUZE/BANDAGES/DRESSINGS) ×2
DURAPREP 26ML APPLICATOR (WOUND CARE) ×2 IMPLANT
ELECT BLADE TIP CTD 4 INCH (ELECTRODE) ×2 IMPLANT
ELECT REM PT RETURN 15FT ADLT (MISCELLANEOUS) ×2 IMPLANT
FACESHIELD WRAPAROUND (MASK) ×8 IMPLANT
FACESHIELD WRAPAROUND OR TEAM (MASK) ×4 IMPLANT
GLOVE SURG ENC TEXT LTX SZ7 (GLOVE) IMPLANT
GLOVE SURG ORTHO LTX SZ8 (GLOVE) ×2 IMPLANT
GLOVE SURG UNDER POLY LF SZ7.5 (GLOVE) ×6 IMPLANT
GOWN STRL REUS W/TWL LRG LVL3 (GOWN DISPOSABLE) ×2 IMPLANT
HANDPIECE INTERPULSE COAX TIP (DISPOSABLE) ×2
HEAD FEM STD 32X+5 STRL (Hips) ×1 IMPLANT
IRRIGATION SURGIPHOR STRL (IV SOLUTION) ×1 IMPLANT
KIT TURNOVER KIT A (KITS) ×2 IMPLANT
LINER ACET CUP 42MMX32MM (Hips) ×1 IMPLANT
MANIFOLD NEPTUNE II (INSTRUMENTS) ×2 IMPLANT
NS IRRIG 1000ML POUR BTL (IV SOLUTION) ×4 IMPLANT
OSTEOTOME MEDIAL 15 (MISCELLANEOUS) ×1 IMPLANT
OSTEOTOME THIN 10.0 3 (INSTRUMENTS) ×1 IMPLANT
OSTEOTOME THIN 6.0 3 (INSTRUMENTS) ×1 IMPLANT
OSTEOTOME U 10.5X8 (ORTHOPEDIC DISPOSABLE SUPPLIES) ×1 IMPLANT
PADDING CAST COTTON 6X4 STRL (CAST SUPPLIES) ×2 IMPLANT
PENCIL SMOKE EVACUATOR (MISCELLANEOUS) IMPLANT
PRESSURIZER FEMORAL UNIV (MISCELLANEOUS) ×2 IMPLANT
PROTECTOR NERVE ULNAR (MISCELLANEOUS) ×2 IMPLANT
SET HNDPC FAN SPRY TIP SCT (DISPOSABLE) ×1 IMPLANT
SPONGE LAP 18X18 RF (DISPOSABLE) ×2 IMPLANT
SPONGE LAP 4X18 RFD (DISPOSABLE) ×2 IMPLANT
SPONGE T-LAP 18X18 ~~LOC~~+RFID (SPONGE) ×1 IMPLANT
STAPLER VISISTAT 35W (STAPLE) ×2 IMPLANT
STEM CEMENTED SUMMIT SZ2 (Stem) ×1 IMPLANT
STOCKINETTE 8 INCH (MISCELLANEOUS) ×1 IMPLANT
SUCTION FRAZIER HANDLE 10FR (MISCELLANEOUS) ×2
SUCTION TUBE FRAZIER 10FR DISP (MISCELLANEOUS) ×1 IMPLANT
SUT MNCRL AB 3-0 PS2 18 (SUTURE) ×1 IMPLANT
SUT STRATAFIX 0 PDS 27 VIOLET (SUTURE) ×2
SUT VIC AB 1 CT1 36 (SUTURE) ×6 IMPLANT
SUT VIC AB 2-0 CT1 27 (SUTURE) ×6
SUT VIC AB 2-0 CT1 TAPERPNT 27 (SUTURE) ×3 IMPLANT
SUTURE STRATFX 0 PDS 27 VIOLET (SUTURE) IMPLANT
SYR 10ML LL (SYRINGE) ×1 IMPLANT
TOWEL OR 17X26 10 PK STRL BLUE (TOWEL DISPOSABLE) ×4 IMPLANT
TOWER CARTRIDGE SMART MIX (DISPOSABLE) ×2 IMPLANT
TRAY FOLEY MTR SLVR 16FR STAT (SET/KITS/TRAYS/PACK) ×2 IMPLANT
TUBE SUCTION HIGH CAP CLEAR NV (SUCTIONS) ×2 IMPLANT
WATER STERILE IRR 1000ML POUR (IV SOLUTION) ×2 IMPLANT

## 2020-09-25 NOTE — Transfer of Care (Signed)
Immediate Anesthesia Transfer of Care Note  Patient: Travis [name]  Procedure(s) Performed: EXCISIONAL TOTAL HIP ARTHROPLASTY WITH ANTIBIOTIC SPACERS (Right Hip)  Patient Location: PACU  Anesthesia Type:Spinal  Level of Consciousness: awake, alert  and oriented  Airway & Oxygen Therapy: Patient Spontanous Breathing and Patient connected to face mask oxygen  Post-op Assessment: Report given to RN and Post -op Vital signs reviewed and stable  Post vital signs: Reviewed and stable  Last Vitals:  Vitals Value Taken Time  BP    Temp    Pulse    Resp    SpO2      Last Pain:  Vitals:   09/25/20 0858  TempSrc: Oral  PainSc: 0-No pain      Patients Stated Pain Goal: 4 (16/10/96 0454)  Complications: No complications documented.

## 2020-09-25 NOTE — Anesthesia Procedure Notes (Signed)
Spinal  Patient location during procedure: OR Start time: 09/25/2020 10:50 AM End time: 09/25/2020 10:57 AM Reason for block: surgical anesthesia Staffing Performed: anesthesiologist  Anesthesiologist: Albertha Ghee, MD Preanesthetic Checklist Completed: patient identified, IV checked, risks and benefits discussed, surgical consent, monitors and equipment checked, pre-op evaluation and timeout performed Spinal Block Patient position: sitting Prep: DuraPrep Patient monitoring: cardiac monitor, continuous pulse ox and blood pressure Approach: midline Location: L3-4 Injection technique: single-shot Needle Needle type: Pencan  Needle gauge: 24 G Needle length: 9 cm Assessment Sensory level: T10 Events: CSF return and second provider Additional Notes Functioning IV was confirmed and monitors were applied. Sterile prep and drape, including hand hygiene and sterile gloves were used. The patient was positioned and the spine was prepped. The skin was anesthetized with lidocaine.  Free flow of clear CSF was obtained prior to injecting local anesthetic into the CSF.  The spinal needle aspirated freely following injection.  The needle was carefully withdrawn.  The patient tolerated the procedure well.

## 2020-09-25 NOTE — Interval H&P Note (Signed)
History and Physical Interval Note:  09/25/2020 8:51 AM  Travis [name]  has presented today for surgery, with the diagnosis of Infected right total hip arthroplasty.  The various methods of treatment have been discussed with the patient and family. After consideration of risks, benefits and other options for treatment, the patient has consented to  Procedure(s): EXCISIONAL TOTAL HIP ARTHROPLASTY WITH ANTIBIOTIC SPACERS (Right) as a surgical intervention.  The patient's history has been reviewed, patient examined, no change in status, stable for surgery.  I have reviewed the patient's chart and labs.  Questions were answered to the patient's satisfaction.     [name]

## 2020-09-25 NOTE — Brief Op Note (Signed)
09/25/2020  10:38 AM  PATIENT:  Travis [name]  70 y.o. male  PRE-OPERATIVE DIAGNOSIS:  Infected right total hip arthroplasty  POST-OPERATIVE DIAGNOSIS:  Infected right total hip arthroplasty  PROCEDURE:  Procedure(s): EXCISIONAL TOTAL HIP ARTHROPLASTY WITH ANTIBIOTIC SPACERS (Right)  SURGEON:  Surgeon(s) and Role:    Paralee Cancel, MD - Primary  PHYSICIAN ASSISTANT: Griffith Citron, PA-C  ANESTHESIA:   spinal  EBL:  400 cc  BLOOD ADMINISTERED:none  DRAINS: none   LOCAL MEDICATIONS USED:  Vancomycin  SPECIMEN:  Source of Specimen:  right hip synovial fluid and tissue  DISPOSITION OF SPECIMEN:  PATHOLOGY  COUNTS:  YES  TOURNIQUET:  * No tourniquets in log *  DICTATION: .Other Dictation: Dictation Number [id number]  PLAN OF CARE: Admit to inpatient   PATIENT DISPOSITION:  PACU - hemodynamically stable.   Delay start of Pharmacological VTE agent (>24hrs) due to surgical blood loss or risk of bleeding: no

## 2020-09-25 NOTE — Interval H&P Note (Signed)
History and Physical Interval Note:  09/25/2020 10:57 AM  Travis [name]  has presented today for surgery, with the diagnosis of Infected right total hip arthroplasty.  The various methods of treatment have been discussed with the patient and family. After consideration of risks, benefits and other options for treatment, the patient has consented to  Procedure(s): EXCISIONAL TOTAL HIP ARTHROPLASTY WITH ANTIBIOTIC SPACERS (Right) as a surgical intervention.  The patient's history has been reviewed, patient examined, no change in status, stable for surgery.  I have reviewed the patient's chart and labs.  Questions were answered to the patient's satisfaction.     [name]

## 2020-09-25 NOTE — Anesthesia Postprocedure Evaluation (Signed)
Anesthesia Post Note  Patient: Travis [name]  Procedure(s) Performed: EXCISIONAL TOTAL HIP ARTHROPLASTY WITH ANTIBIOTIC SPACERS (Right Hip)     Patient location during evaluation: PACU Anesthesia Type: MAC and Spinal Level of consciousness: oriented and awake and alert Pain management: pain level controlled Vital Signs Assessment: post-procedure vital signs reviewed and stable Respiratory status: spontaneous breathing, respiratory function stable and patient connected to nasal cannula oxygen Cardiovascular status: blood pressure returned to baseline and stable Postop Assessment: no headache, no backache and no apparent nausea or vomiting Anesthetic complications: no   No complications documented.  Last Vitals:  Vitals:   09/25/20 1544 09/25/20 1727  BP: 124/68 130/74  Pulse: 61 60  Resp: 16 18  Temp: 36.4 C 36.5 C  SpO2: 93% 97%    Last Pain:  Vitals:   09/25/20 1727  TempSrc: Oral  PainSc:                  Marquies Wanat S

## 2020-09-25 NOTE — Discharge Instructions (Signed)
INSTRUCTIONS AFTER SURGERY  o Remove items at home which could result in a fall. This includes throw rugs or furniture in walking pathways o ICE to the affected joint every three hours while awake for 30 minutes at a time, for at least the first 3-5 days, and then as needed for pain and swelling.  Continue to use ice for pain and swelling. You may notice swelling that will progress down to the foot and ankle.  This is normal after surgery.  Elevate your leg when you are not up walking on it.   o Continue to use the breathing machine you got in the hospital (incentive spirometer) which will help keep your temperature down.  It is common for your temperature to cycle up and down following surgery, especially at night when you are not up moving around and exerting yourself.  The breathing machine keeps your lungs expanded and your temperature down.   DIET:  As you were doing prior to hospitalization, we recommend a well-balanced diet.  DRESSING / WOUND CARE / SHOWERING  Keep the surgical dressing until follow up.  The dressing is water proof, so you can shower without any extra covering.  IF THE DRESSING FALLS OFF or the wound gets wet inside, change the dressing with sterile gauze.  Please use good hand washing techniques before changing the dressing.  Do not use any lotions or creams on the incision until instructed by your surgeon.    ACTIVITY  o Increase activity slowly as tolerated, but follow the weight bearing instructions below.   o No driving for 6 weeks or until further direction given by your physician.  You cannot drive while taking narcotics.  o No lifting or carrying greater than 10 lbs. until further directed by your surgeon. o Avoid periods of inactivity such as sitting longer than an hour when not asleep. This helps prevent blood clots.  o You may return to work once you are authorized by your doctor.     WEIGHT BEARING   Partial weight bearing with assist device as directed.   .   CONSTIPATION  Constipation is defined medically as fewer than three stools per week and severe constipation as less than one stool per week.  Even if you have a regular bowel pattern at home, your normal regimen is likely to be disrupted due to multiple reasons following surgery.  Combination of anesthesia, postoperative narcotics, change in appetite and fluid intake all can affect your bowels.   YOU MUST use at least one of the following options; they are listed in order of increasing strength to get the job done.  They are all available over the counter, and you may need to use some, POSSIBLY even all of these options:    Drink plenty of fluids (prune juice may be helpful) and high fiber foods Colace 100 mg by mouth twice a day  Senokot for constipation as directed and as needed Dulcolax (bisacodyl), take with full glass of water  Miralax (polyethylene glycol) once or twice a day as needed.  If you have tried all these things and are unable to have a bowel movement in the first 3-4 days after surgery call either your surgeon or your primary doctor.    If you experience loose stools or diarrhea, hold the medications until you stool forms back up.  If your symptoms do not get better within 1 week or if they get worse, check with your doctor.  If you experience "the worst abdominal  pain ever" or develop nausea or vomiting, please contact the office immediately for further recommendations for treatment.   ITCHING:  If you experience itching with your medications, try taking only a single pain pill, or even half a pain pill at a time.  You can also use Benadryl over the counter for itching or also to help with sleep.   TED HOSE STOCKINGS:  Use stockings on both legs until for at least 2 weeks or as directed by physician office. They may be removed at night for sleeping.  MEDICATIONS:  See your medication summary on the "After Visit Summary" that nursing will review with you.  You may have  some home medications which will be placed on hold until you complete the course of blood thinner medication.  It is important for you to complete the blood thinner medication as prescribed.  PRECAUTIONS:  If you experience chest pain or shortness of breath - call 911 immediately for transfer to the hospital emergency department.   If you develop a fever greater that 101 F, purulent drainage from wound, increased redness or drainage from wound, foul odor from the wound/dressing, or calf pain - CONTACT YOUR SURGEON.                                                   FOLLOW-UP APPOINTMENTS:  If you do not already have a post-op appointment, please call the office for an appointment to be seen by your surgeon.  Guidelines for how soon to be seen are listed in your "After Visit Summary", but are typically between 1-4 weeks after surgery.  OTHER INSTRUCTIONS:   Knee Replacement:  Do not place pillow under knee, focus on keeping the knee straight while resting. CPM instructions: 0-90 degrees, 2 hours in the morning, 2 hours in the afternoon, and 2 hours in the evening. Place foam block, curve side up under heel at all times except when in CPM or when walking.  DO NOT modify, tear, cut, or change the foam block in any way.  POST-OPERATIVE OPIOID TAPER INSTRUCTIONS: . It is important to wean off of your opioid medication as soon as possible. If you do not need pain medication after your surgery it is ok to stop day one. [name] Opioids include: o Codeine, Hydrocodone(Norco, Vicodin), Oxycodone(Percocet, oxycontin) and hydromorphone amongst others.  . Long term and even short term use of opiods can cause: o Increased pain response o Dependence o Constipation o Depression o Respiratory depression o And more.  . Withdrawal symptoms can include o Flu like symptoms o Nausea, vomiting o And more . Techniques to manage these symptoms o Hydrate well o Eat regular healthy meals o Stay active o Use relaxation  techniques(deep breathing, meditating, yoga) . Do Not substitute Alcohol to help with tapering . If you have been on opioids for less than two weeks and do not have pain than it is ok to stop all together.  . Plan to wean off of opioids o This plan should start within one week post op of your joint replacement. o Maintain the same interval or time between taking each dose and first decrease the dose.  o Cut the total daily intake of opioids by one tablet each day o Next start to increase the time between doses. o The last dose that should be eliminated is the  evening dose.     MAKE SURE YOU:  . Understand these instructions.  . Get help right away if you are not doing well or get worse.    Thank you for letting us be a part of your medical care team.  It is a privilege we respect greatly.  We hope these instructions will help you stay on track for a fast and full recovery!     \

## 2020-09-25 NOTE — Plan of Care (Signed)
  Problem: Clinical Measurements: Goal: Postoperative complications will be avoided or minimized Outcome: Progressing   Problem: Pain Management: Goal: Pain level will decrease with appropriate interventions Outcome: Progressing   

## 2020-09-25 NOTE — Anesthesia Procedure Notes (Addendum)
Date/Time: 09/25/2020 10:45 AM Performed by: Lollie Sails, CRNA Pre-anesthesia Checklist: Patient identified, Emergency Drugs available, Suction available, Patient being monitored and Timeout performed Oxygen Delivery Method: Simple face mask

## 2020-09-26 ENCOUNTER — Encounter (HOSPITAL_COMMUNITY): Payer: Self-pay | Admitting: Orthopedic Surgery

## 2020-09-26 DIAGNOSIS — T8451XA Infection and inflammatory reaction due to internal right hip prosthesis, initial encounter: Secondary | ICD-10-CM | POA: Diagnosis not present

## 2020-09-26 LAB — CBC
HCT: 37.7 % — ABNORMAL LOW (ref 39.0–52.0)
Hemoglobin: 12.6 g/dL — ABNORMAL LOW (ref 13.0–17.0)
MCH: 31.5 pg (ref 26.0–34.0)
MCHC: 33.4 g/dL (ref 30.0–36.0)
MCV: 94.3 fL (ref 80.0–100.0)
Platelets: 265 10*3/uL (ref 150–400)
RBC: 4 MIL/uL — ABNORMAL LOW (ref 4.22–5.81)
RDW: 13 % (ref 11.5–15.5)
WBC: 14.8 10*3/uL — ABNORMAL HIGH (ref 4.0–10.5)
nRBC: 0 % (ref 0.0–0.2)

## 2020-09-26 LAB — BASIC METABOLIC PANEL
Anion gap: 11 (ref 5–15)
BUN: 17 mg/dL (ref 8–23)
CO2: 25 mmol/L (ref 22–32)
Calcium: 8.7 mg/dL — ABNORMAL LOW (ref 8.9–10.3)
Chloride: 101 mmol/L (ref 98–111)
Creatinine, Ser: 0.69 mg/dL (ref 0.61–1.24)
GFR, Estimated: >60 mL/min (ref >60)
Glucose, Bld: 142 mg/dL — ABNORMAL HIGH (ref 70–99)
Potassium: 3.8 mmol/L (ref 3.5–5.1)
Sodium: 137 mmol/L (ref 135–145)

## 2020-09-26 LAB — GLUCOSE, CAPILLARY: Glucose-Capillary: 118 mg/dL — ABNORMAL HIGH (ref 70–99)

## 2020-09-26 LAB — C-REACTIVE PROTEIN: CRP: 6.1 mg/dL — ABNORMAL HIGH (ref <1.0)

## 2020-09-26 LAB — SEDIMENTATION RATE: Sed Rate: 52 mm/hr — ABNORMAL HIGH (ref 0–16)

## 2020-09-26 MED ORDER — VANCOMYCIN IV (FOR PTA / DISCHARGE USE ONLY): 1000.0000 mg | Freq: Two times a day (BID) | INTRAVENOUS | 0 refills | Status: DC

## 2020-09-26 MED ORDER — CHLORHEXIDINE GLUCONATE CLOTH 2 % EX PADS
6.0000 | MEDICATED_PAD | Freq: Every day | CUTANEOUS | Status: DC
  Administered 2020-09-26 – 2020-09-27 (×2): 6 via TOPICAL

## 2020-09-26 MED ORDER — CEFTRIAXONE IV (FOR PTA / DISCHARGE USE ONLY): 2.0000 g | INTRAVENOUS | 0 refills | Status: DC

## 2020-09-26 MED ORDER — VANCOMYCIN HCL 2000 MG/400ML IV SOLN
2000.0000 mg | Freq: Once | INTRAVENOUS | Status: AC
  Administered 2020-09-26: 2000 mg via INTRAVENOUS
  Filled 2020-09-26: qty 400

## 2020-09-26 MED ORDER — SODIUM CHLORIDE 0.9% FLUSH: 10.0000 mL | INTRAVENOUS | Status: DC | PRN

## 2020-09-26 MED ORDER — SODIUM CHLORIDE 0.9 % IV SOLN
2.0000 g | INTRAVENOUS | Status: DC
  Administered 2020-09-26 – 2020-09-27 (×2): 2 g via INTRAVENOUS
  Filled 2020-09-26: qty 2
  Filled 2020-09-26: qty 20

## 2020-09-26 MED ORDER — VANCOMYCIN HCL 1000 MG/200ML IV SOLN
1000.0000 mg | Freq: Two times a day (BID) | INTRAVENOUS | Status: DC
  Administered 2020-09-26 – 2020-09-27 (×2): 1000 mg via INTRAVENOUS
  Filled 2020-09-26 (×3): qty 200

## 2020-09-26 NOTE — Plan of Care (Signed)
  Problem: Activity: Goal: Ability to avoid complications of mobility impairment will improve Outcome: Progressing Goal: Ability to tolerate increased activity will improve Outcome: Progressing   Problem: Pain Management: Goal: Pain level will decrease with appropriate interventions Outcome: Progressing   

## 2020-09-26 NOTE — Evaluation (Signed)
Physical Therapy Evaluation Patient Details Name: Travis [name] MRN: [id number] DOB: [birth year] Today's Date: 09/26/2020   History of Present Illness  70 yo male with initial THA in 2020 now s/p Resection of infected right total hip arthroplasty with excisional debridement followed by placement of an antibiotic cemented articulating spacer.   PMH: pna, HTN, DVT. PAF, MM  Clinical Impression  Pt admitted with above diagnosis.  Pt progressing with gait and mobility. He is concerned about his wife assisting him at home. Today pt requirin gincr assist with bed mobility however he plans to sleep in lift chair at home   Pt currently with functional limitations due to the deficits listed below (see PT Problem List). Pt will benefit from skilled PT to increase their independence and safety with mobility to allow discharge to the venue listed below.       Follow Up Recommendations Follow surgeon's recommendation for DC plan and follow-up therapies    Equipment Recommendations  None recommended by PT    Recommendations for Other Services       Precautions / Restrictions Precautions Precautions: Fall Restrictions Weight Bearing Restrictions: Yes RLE Weight Bearing: Partial weight bearing RLE Partial Weight Bearing Percentage or Pounds: 50%      Mobility  Bed Mobility Overal bed mobility: Needs Assistance Bed Mobility: Supine to Sit     Supine to sit: Min assist;Mod assist;HOB elevated     General bed mobility comments: incr time and effort. cues for technique, safety, self assist. will be sleeping in lift chair at home    Transfers Overall transfer level: Needs assistance Equipment used: Rolling walker (2 wheeled) Transfers: Sit to/from Stand Sit to Stand: Min guard;Min assist         General transfer comment: cues for hand placement  Ambulation/Gait Ambulation/Gait assistance: Min guard;Min assist Gait Distance (Feet): 60 Feet Assistive device: Rolling walker (2  wheeled) Gait Pattern/deviations: Step-to pattern     General Gait Details: cues for sequence, PWB  Stairs            Wheelchair Mobility    Modified Rankin (Stroke Patients Only)       Balance Overall balance assessment: Needs assistance         Standing balance support: Bilateral upper extremity supported;During functional activity Standing balance-Leahy Scale: Poor Standing balance comment: reliant on UEs                             Pertinent Vitals/Pain Pain Assessment: 0-10 Pain Score: 7  Pain Location: right hip Pain Descriptors / Indicators: Grimacing;Aching;Guarding Pain Intervention(s): Limited activity within patient's tolerance;Monitored during session;Premedicated before session;Repositioned;RN gave pain meds during session    Home Living Family/patient expects to be discharged to:: Private residence Living Arrangements: Spouse/significant other Available Help at Discharge: Family Type of Home: House Home Access: Stairs to enter   Technical brewer of Steps: 3 Home Layout: Two level;Able to live on main level with bedroom/bathroom Home Equipment: Gilford Rile - 2 wheels;Cane - single point      Prior Function Level of Independence: Independent               Hand Dominance        Extremity/Trunk Assessment   Upper Extremity Assessment Upper Extremity Assessment: Overall WFL for tasks assessed    Lower Extremity Assessment Lower Extremity Assessment: RLE deficits/detail RLE Deficits / Details: ankle and knee grossly WFL RLE: Unable to fully assess due to pain  Communication   Communication: No difficulties  Cognition Arousal/Alertness: Awake/alert Behavior During Therapy: Anxious Overall Cognitive Status: Within Functional Limits for tasks assessed                                        General Comments      Exercises     Assessment/Plan    PT Assessment Patient needs continued PT  services  PT Problem List Decreased strength;Decreased mobility;Decreased activity tolerance;Decreased balance;Pain;Decreased range of motion       PT Treatment Interventions DME instruction;Therapeutic activities;Gait training;Therapeutic exercise;Functional mobility training;Patient/family education;Stair training;Balance training    PT Goals (Current goals can be found in the Care Plan section)  Acute Rehab PT Goals Patient Stated Goal: home in a few days PT Goal Formulation: With patient Time For Goal Achievement: 10/02/20 Potential to Achieve Goals: Good    Frequency 7X/week   Barriers to discharge        Co-evaluation               AM-PAC PT "6 Clicks" Mobility  Outcome Measure Help needed turning from your back to your side while in a flat bed without using bedrails?: A Lot Help needed moving from lying on your back to sitting on the side of a flat bed without using bedrails?: A Lot Help needed moving to and from a bed to a chair (including a wheelchair)?: A Little Help needed standing up from a chair using your arms (e.g., wheelchair or bedside chair)?: A Little Help needed to walk in hospital room?: A Little Help needed climbing 3-5 steps with a railing? : A Lot 6 Click Score: 15    End of Session Equipment Utilized During Treatment: Gait belt Activity Tolerance: Patient tolerated treatment well;Patient limited by pain Patient left: in chair;with call bell/phone within reach;with chair alarm set Nurse Communication: Mobility status PT Visit Diagnosis: Other abnormalities of gait and mobility (R26.89)    Time: 4917-9150 PT Time Calculation (min) (ACUTE ONLY): 19 min   Charges:   PT Evaluation $PT Eval Low Complexity: [place], PT  Acute Rehab Dept ([place]) [phone] Pager [phone]  09/26/2020   [place] 09/26/2020, 11:14 AM

## 2020-09-26 NOTE — Progress Notes (Signed)
PHARMACY CONSULT NOTE FOR:  OUTPATIENT  PARENTERAL ANTIBIOTIC THERAPY (OPAT)  Indication: Prosthetic hip infection  Regimen: Vancomycin 1000 mg IV Q 12 hours + Ceftriaxone 2 gm IV Q 24 hours  End date: 11/07/20   IV antibiotic discharge orders are pended. To discharging provider:  please sign these orders via discharge navigator,  Select New Orders & click on the button choice - Manage This Unsigned Work.     Thank you for allowing pharmacy to be a part of this patient's care.  Jimmy Footman, PharmD, BCPS, BCIDP Infectious Diseases Clinical Pharmacist Phone: [phone] 09/26/2020, 2:26 PM

## 2020-09-26 NOTE — Progress Notes (Signed)
Subjective: 1 Day Post-Op Procedure(s) (LRB): EXCISIONAL TOTAL HIP ARTHROPLASTY WITH ANTIBIOTIC SPACERS (Right) Patient reports pain as mild.   Patient seen in rounds with Dr. Alvan Dame. Patient is well, and has had no acute complaints or problems overnight. Foley catheter removed, positive flatus.   We will start therapy today.   Objective: Vital signs in last 24 hours: Temp:  [97.6 F (36.4 C)-98.7 F (37.1 C)] 98.2 F (36.8 C) (05/27 0556) Pulse Rate:  [53-61] 56 (05/27 0556) Resp:  [12-36] 16 (05/27 0556) BP: (111-149)/(53-88) 121/60 (05/27 0556) SpO2:  [93 %-99 %] 94 % (05/27 0556) Weight:  [113.9 kg] 113.9 kg (05/26 0858)  Intake/Output from previous day:  Intake/Output Summary (Last 24 hours) at 09/26/2020 0718 Last data filed at 09/26/2020 0615 Gross per 24 hour  Intake 3250.56 ml  Output 1575 ml  Net 1675.56 ml     Intake/Output this shift: No intake/output data recorded.  Labs: Recent Labs    09/26/20 0316  HGB 12.6*   Recent Labs    09/26/20 0316  WBC 14.8*  RBC 4.00*  HCT 37.7*  PLT 265   Recent Labs    09/26/20 0316  NA 137  K 3.8  CL 101  CO2 25  BUN 17  CREATININE 0.69  GLUCOSE 142*  CALCIUM 8.7*   No results for input(s): LABPT, INR in the last 72 hours.  Exam: General - Patient is Alert and Oriented Extremity - Neurologically intact Sensation intact distally Intact pulses distally Dorsiflexion/Plantar flexion intact Dressing - dressing C/D/I Motor Function - intact, moving foot and toes well on exam.   Past Medical History:  Diagnosis Date  . Arthritis    "comes and goes; mostly in hands, occasionally elbow" (04/05/2017)  . Complication of anesthesia    "just wanted to sleep alot  for couple days S/P VARICOCELE OR" (04/05/2017)  . DVT (deep venous thrombosis) (HCC)    LLE  . Dysrhythmia    PVCs   . GERD (gastroesophageal reflux disease)   . History of hiatal hernia   . Hypertension   . Impaired glucose tolerance   .  Myeloma ([place])   . Obesity (BMI 30-39.9) 07/26/2016  . OSA on CPAP 07/26/2016   Mild with AHI 12.5/hr now on CPAP at 14cm H2O  . PAF (paroxysmal atrial fibrillation) ([place])    s/p PVI Ablation in 2018 // Flecainide Rx // Echo 8/21: EF 60-65, no RWMA, mild LVH, normal RV SF, moderate RVE, mild BAE, trivial MR, mild dilation of aortic root (40 mm)  . Pneumonia    "may have had walking pneumonia a few years ago" (04/05/2017)  . Pre-diabetes   . 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   Weight as of this encounter: 113.9 kg. Advance diet Up with therapy  DVT Prophylaxis - TED hose and Eliquis PWB 50% RLE Begin therapy Hip precautions discussed with patient  Plan is to go Home after hospital stay.   Plan for PICC line to be placed today. In office aspiration showed no growth, intra-op cultures pending. We will consult Infectious Disease for assistance with outpatient antibiotic regimen. Will need HHRN for IV antibiotics.   Plan to go home today vs tomorrow pending arrangement of  above needs.   Griffith Citron, PA-C Orthopedic Surgery [phone] 09/26/2020, 7:18 AM

## 2020-09-26 NOTE — TOC Initial Note (Addendum)
Transition of Care [facility]) - Initial/Assessment Note   Patient Details  Name: Travis [name] MRN: [id number] Date of Birth: 1951/02/22  [facility] of [facility]) CM/SW Contact:    Sherie Don, LCSW Phone Number: 09/26/2020, 1:30 PM  Clinical Narrative: Patient is a 70 year old male who was admitted for infection of right prosthetic hip joint. CSW met with patient to discuss discharge plan and needs. Per documentation, patient will discharge with a home exercise program. Patient has a toilet riser, but is in need of a rolling walker. [employer] to deliver rolling walker to patient's room. Patient is expected to discharge home with IV antibiotics. Pam with [employer] made aware of likely IV antibiotics need. TOC awaiting OPAT orders/recommendations.  Addendum: OPAT orders are in. CSW notified Pam with [employer].  Expected Discharge Plan: OP Rehab Barriers to Discharge: Continued Medical Work up  Patient Goals and CMS Choice Patient states their goals for this hospitalization and ongoing recovery are:: Get better before going home CMS Medicare.gov Compare Post Acute Care list provided to:: Patient Choice offered to / list presented to : Patient  Expected Discharge Plan and Services Expected Discharge Plan: OP Rehab In-house Referral: Clinical Social Work Post Acute Care Choice: [place] arrangements for the past 2 months: [place]             DME Arranged: Walker rolling DME Agency: [employer] Date DME Agency Contacted: 09/26/20 Representative spoke with at DME Agency: Cristie Hem  Prior Living Arrangements/Services Living arrangements for the past 2 months: [place] Lives with:: Spouse Patient language and need for interpreter reviewed:: Yes Do you feel safe going back to the place where you live?: Yes      Need for Family Participation in Patient Care: No (Comment) Care giver support system in place?: Yes (comment) Current home services: DME (Toilet  riser) Criminal Activity/Legal Involvement Pertinent to Current Situation/Hospitalization: No - Comment as needed  Permission Sought/Granted Permission sought to share information with : Other (comment) Permission granted to share information with : Yes, Verbal Permission Granted Permission granted to share info w AGENCY: [place] agencies  Emotional Assessment Appearance:: Appears stated age Attitude/Demeanor/Rapport: Engaged Affect (typically observed): Accepting Orientation: : Oriented to Self,Oriented to Place,Oriented to  Time,Oriented to Situation Alcohol / Substance Use: Not Applicable Psych Involvement: No (comment)  Admission diagnosis:  Infection of right prosthetic hip joint [facility]) [T84.51XA] Patient Active Problem List   Diagnosis Date Noted  . 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[place]) 09/25/2020  . PAF (paroxysmal atrial fibrillation) ([place])   . GERD (gastroesophageal reflux disease) 03/29/2018  . Hiatal hernia 03/29/2018  . History of DVT (deep vein thrombosis) 03/29/2018  . Hospital acquired PNA 03/29/2018  . Osteoarthritis 03/29/2018  . Thrombophlebitis 03/29/2018  . Neuropathy 08/29/2017  . 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 Paroxysmal atrial fibrillation ([place]) 04/05/2017  . Prolapsed internal hemorrhoids, grade 4, s/p ligation/pexy/hemorrhoidectomy x 2 02/24/2017 02/24/2017  . OSA (obstructive sleep apnea) 07/26/2016  . Obesity (BMI 30-39.9) 07/26/2016  . Atrial fibrillation ([place])   . Hypertension   . Chest pain at rest    PCP:  Lajean Manes, MD Pharmacy:   [facility] [place], [place] [address] AT [facility] [address] [place] [address] Phone: [phone] Fax: [phone]  [facility] [address] [place], [place]. AT [place]  [place] [address] Phone: [phone] Fax: [phone]  Readmission Risk  Interventions No flowsheet data found.

## 2020-09-26 NOTE — Progress Notes (Signed)
Peripherally Inserted Central Catheter Placement  The IV Nurse has discussed with the patient and/or persons authorized to consent for the patient, the purpose of this procedure and the potential benefits and risks involved with this procedure.  The benefits include less needle sticks, lab draws from the catheter, and the patient may be discharged home with the catheter. Risks include, but not limited to, infection, bleeding, blood clot (thrombus formation), and puncture of an artery; nerve damage and irregular heartbeat and possibility to perform a PICC exchange if needed/ordered by physician.  Alternatives to this procedure were also discussed.  Bard Power PICC patient education guide, fact sheet on infection prevention and patient information card has been provided to patient /or left at bedside.    PICC Placement Documentation  PICC Single Lumen 28/41/32 Right Basilic 45 cm 0 cm (Active)  Indication for Insertion or Continuance of Line Home intravenous therapies (PICC only) 09/26/20 1845  Exposed Catheter (cm) 0 cm 09/26/20 1845  Site Assessment Clean;Dry;Intact 09/26/20 1845  Line Status Flushed;Blood return noted;Saline locked 09/26/20 1845  Dressing Type Transparent 09/26/20 1845  Dressing Status Clean;Dry;Intact 09/26/20 1845  Antimicrobial disc in place? Yes 09/26/20 1845  Dressing Change Due 10/03/20 09/26/20 1845       Sc[address]ty Court 09/26/2020, 6:47 PM

## 2020-09-26 NOTE — Consult Note (Signed)
[facility] for Infectious Disease       Reason for Consult: prosthetic hip infection    Referring Physician: Dr. Alvan Dame  Active Problems:   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[place])   . amLODipine  10 mg Oral Daily  . apixaban  2.5 mg Oral Q12H  . docusate sodium  100 mg Oral BID  . ferrous sulfate  325 mg Oral TID PC  . flecainide  75 mg Oral BID  . gabapentin  600 mg Oral BID  . hydrochlorothiazide  25 mg Oral Daily  . lisinopril  40 mg Oral Daily  . metoprolol succinate  100 mg Oral Daily  . potassium chloride SA  20 mEq Oral Daily  . psyllium  1 packet Oral QHS    Recommendations:  vancomycin and ceftriaxone for 6 weeks OPAT - home health, twice weekly labs picc line I will recheck the culture results again on Tuesday 5/31 and make changes as indicated  Assessment: He has a PJI now s/p 2 stage resection arthroplasty and plan for 6 weeks minimum antibiotics.  Ok from [place] standpoint for discharge when ready.  OPAT placed, home health aware.    Diagnosis: Prosthetic joint infection, s/p resection arthroplasty by Dr. Alvan Dame  Culture Result: culture negative to date  No Known Allergies  OPAT Orders Discharge antibiotics to be given via PICC line Discharge antibiotics: vancomycin 1000 mg IV every 12 hours + ceftriaxone 2 grams IV every 24 hours Per pharmacy protocol yes Aim for Vancomycin trough 15-20 or AUC 400-550 (unless otherwise indicated) Duration: 6 weeks End Date: 11/07/20  [facility] Care Per Protocol: yes  Home health RN for IV administration and teaching; PICC line care and labs.    Labs weekly while on IV antibiotics: _x_ CBC with differential __ BMP _x_ CMP _x_ CRP _x_ ESR _x_ Vancomycin trough __ CK  _x_ Please pull PIC at completion of IV antibiotics __ Please leave PIC in place until doctor has seen patient or been notified  Fax weekly labs to [phone]  Clinic Follow Up Appt: 10/24/20   @ 3pm with Dr.  Linus Salmons   Antibiotics: cefazolin  HPI: Travis [name] is a 70 y.o. male with a history of a right total hip arthroplasty done in September 2020 by Dr. Creig Hines started to develop pain in his right hip in October, 2021.  Inflammatory markers at that time were low/normal but pain continued.  He was seen by Dr. Alvan Dame and aspiration notable for 15,000 WBCs and culture was sent and remained negative.  ESR and CRP were elevated (results not available) He was brought in today for resection arthroplasty with excisional debridement with antibiotics spacer placement.  In the OR, purulent material was noted.    Review of Systems:  Constitutional: negative for fevers and chills Gastrointestinal: negative for nausea and diarrhea Integument/breast: negative for rash All other systems reviewed and are negative    Past Medical History:  Diagnosis Date  . Arthritis    "comes and goes; mostly in hands, occasionally elbow" (04/05/2017)  . Complication of anesthesia    "just wanted to sleep alot  for couple days S/P VARICOCELE OR" (04/05/2017)  . DVT (deep venous thrombosis) (HCC)    LLE  . Dysrhythmia    PVCs   . GERD (gastroesophageal reflux disease)   . History of hiatal hernia   . Hypertension   . Impaired glucose tolerance   . Myeloma ([place])   . Obesity (BMI 30-39.9) 07/26/2016  .  OSA on CPAP 07/26/2016   Mild with AHI 12.5/hr now on CPAP at 14cm H2O  . PAF (paroxysmal atrial fibrillation) ([place])    s/p PVI Ablation in 2018 // Flecainide Rx // Echo 8/21: EF 60-65, no RWMA, mild LVH, normal RV SF, moderate RVE, mild BAE, trivial MR, mild dilation of aortic root (40 mm)  . Pneumonia    "may have had walking pneumonia a few years ago" (04/05/2017)  . Pre-diabetes   . Thrombophlebitis     Social History   Tobacco Use  . Smoking status: Never Smoker  . Smokeless tobacco: Never Used  Vaping Use  . Vaping Use: Never used  Substance Use Topics  . Alcohol use: Yes    Alcohol/week: 14.0 standard  drinks    Types: 14 Glasses of wine per week    Comment: wine on weekend   . Drug use: No    Family History  Problem Relation Age of Onset  . Dementia Mother   . Stroke Father   . Atrial fibrillation Father   . Hypertension Father   . Hypertension Paternal Grandfather   . Dementia Maternal Grandmother     No Known Allergies  Physical Exam: Constitutional: in no apparent distress  Vitals:   09/26/20 1019 09/26/20 1410  BP: 127/61 126/67  Pulse: (!) 57 (!) 57  Resp: 20 18  Temp: 98.3 F (36.8 C) 98 F (36.7 C)  SpO2: 97% 98%   EYES: anicteric Cardiovascular: Cor RRR Respiratory: clear; Musculoskeletal: no pedal edema noted Skin: negatives: no rash Neuro: non-focal  Lab Results  Component Value Date   WBC 14.8 (H) 09/26/2020   HGB 12.6 (L) 09/26/2020   HCT 37.7 (L) 09/26/2020   MCV 94.3 09/26/2020   PLT 265 09/26/2020    Lab Results  Component Value Date   CREATININE 0.69 09/26/2020   BUN 17 09/26/2020   NA 137 09/26/2020   K 3.8 09/26/2020   CL 101 09/26/2020   CO2 25 09/26/2020    Lab Results  Component Value Date   ALT 25 09/18/2020   AST 28 09/18/2020   ALKPHOS 85 09/18/2020     Microbiology: Recent Results (from the past 240 hour(s))  Surgical pcr screen     Status: None   Collection Time: 09/18/20 12:46 PM   Specimen: Nasal Mucosa; Nasal Swab  Result Value Ref Range Status   MRSA, PCR NEGATIVE NEGATIVE Final   Staphylococcus aureus NEGATIVE NEGATIVE Final    Comment: (NOTE) The Xpert SA Assay (FDA approved for NASAL specimens in patients 30 years of age and older), is one component of a comprehensive surveillance program. It is not intended to diagnose infection nor to guide or monitor treatment. Performed at [facility], [place] [address] [place], [place] [address]   SARS CORONAVIRUS 2 (TAT 6-24 HRS) Nasopharyngeal Nasopharyngeal Swab     Status: None   Collection Time: 09/22/20 12:52 PM   Specimen: Nasopharyngeal  Swab  Result Value Ref Range Status   SARS Coronavirus 2 NEGATIVE NEGATIVE Final    Comment: (NOTE) SARS-CoV-2 target nucleic acids are NOT DETECTED.  The SARS-CoV-2 RNA is generally detectable in upper and lower respiratory specimens during the acute phase of infection. Negative results do not preclude SARS-CoV-2 infection, do not rule out co-infections with other pathogens, and should not be used as the sole basis for treatment or other patient management decisions. Negative results must be combined with clinical observations, patient history, and epidemiological information. The expected result is  Negative.  Fact Sheet for Patients: SugarRoll.be  Fact Sheet for Healthcare Providers: https://www.woods-mathews.com/  This test is not yet approved or cleared by the [place] FDA and  has been authorized for detection and/or diagnosis of SARS-CoV-2 by FDA under an Emergency Use Authorization (EUA). This EUA will remain  in effect (meaning this test can be used) for the duration of the COVID-19 declaration under Se ction 564(b)(1) of the Act, 21 U.S.C. section 360bbb-3(b)(1), unless the authorization is terminated or revoked sooner.  Performed at [facility], [place] [address], [place], [place] [address]   Aerobic/Anaerobic Culture w Gram Stain (surgical/deep wound)     Status: None (Preliminary result)   Collection Time: 09/25/20 11:25 AM   Specimen: Soft Tissue, Other  Result Value Ref Range Status   Specimen Description   Final    SYNOVIAL RT HIP Performed at [place] [address], [place], [place] [address]    Special Requests   Final    NONE Performed at [facility], [place] [address], [place], [place] [address]    Gram Stain NO WBC SEEN NO ORGANISMS SEEN   Final   Culture   Final    NO GROWTH < 24 HOURS Performed at [facility], [place] [address] [place], [place]  [address]    Report Status PENDING  Incomplete  Aerobic/Anaerobic Culture w Gram Stain (surgical/deep wound)     Status: None (Preliminary result)   Collection Time: 09/25/20 11:30 AM   Specimen: Soft Tissue, Other  Result Value Ref Range Status   Specimen Description   Final    TISSUE RT HIP Performed at [place] [address], [place], [place] [address]    Special Requests   Final    NONE Performed at [facility], [place] [address] [place], [place] [address]    Gram Stain NO WBC SEEN NO ORGANISMS SEEN   Final   Culture   Final    NO GROWTH < 24 HOURS Performed at [facility], [place] [address], [place], [place] [address]    Report Status PENDING  Incomplete    Thayer Headings, [place] for Infectious Disease [place] [email or web address] 09/26/2020, 2:32 PM

## 2020-09-26 NOTE — Op Note (Signed)
NAME: Travis [name], Travis [name] MEDICAL RECORD NO: [id number] ACCOUNT NO: [id number] DATE OF BIRTH: [birth year] FACILITY: [facility]ATION: [id number] PHYSICIAN: Pietro Cassis. Alvan Dame, MD  Operative Report   DATE OF PROCEDURE: 09/25/2020  PREOPERATIVE DIAGNOSIS:  Infected right total hip arthroplasty.  POSTOPERATIVE DIAGNOSIS:  Infected right total hip arthroplasty.  PROCEDURE:  Resection of infected right total hip arthroplasty with excisional debridement followed by placement of an antibiotic cemented articulating spacer.  COMPONENTS USED:  DePuy size 32 inner diameter Prostalac acetabular polyethylene shell cemented into the acetabulum with a size 2 Summit Basic cemented stem, cemented into the proximal femur with a 32+5 metal ball.  SURGEON:  Paralee Cancel, MD  ASSISTANT:  Ignacia Bayley.  Note that Ms. Demaris Callander was present for the entirety of the case from preoperative positioning and perioperative management of the operative extremity, general facilitation of the case and primary wound closure.  ANESTHESIA:  Spinal.  BLOOD LOSS:  400 mL.  DRAINS:  None.  SPECIMENS:  We did send right hip fluid as well as right hip tissue to pathology for further evaluation as our preoperative evaluation and aspiration was negative for culture growth.  INDICATION OF THE PROCEDURE:  The patient is a 70 year old male with a history of right total hip arthroplasty performed in 01/2019.  He had presented to the office with routine followup and concern for pain.  Given his clinical and radiographic  appearance, it did not appear that it was typical for the pain level and dysfunction he had, so we worked him up for infection.  His sedimentation rate and C-reactive protein were elevated.  Hip aspiration revealed elevated white blood cell count.   However, culture did not grow any bacteria.  Given these findings and his pain, I felt that it was in his best interest to proceed with excision of his right hip and  treat this as a two-stage treatment.  I reviewed the pros and cons and the risks and benefits of single stage versus two-stage  treatment given that he was this far out from his index surgery.  Consent was obtained for management of infection.  DESCRIPTION OF PROCEDURE:  The patient was brought to the operative theater.  Once adequate anesthesia was administered, he was positioned into the left lateral decubitus position with the right hip up.  The right lower extremity was then prepped and  draped in sterile fashion.  A timeout was performed, identifying the patient, the planned procedure, and extremity.  His old incision was demarcated on the skin.  It was extended proximally and distally for exposure purposes in the revision setting.   Soft tissue dissection was carried out to the iliotibial band and gluteal fascia, which was then incised for a posterior approach to the hip, which is what he had before.  As we entered into the hip joint through scar tissue, we did encounter  purulent-appearing fluid.  This was aspirated using a 10 mL syringe to send to pathology for further Gram stain evaluation.  We then spent the vast majority of the case at exposure of the posterior aspect of the hip, excising scar tissue and nonviable  tissue around the hip sharply with a Bovie.  This capsular tissue was sent off to pathology for further Gram stain evaluation as well.  Once I had the posterior two-thirds of the hip exposed, we were able to dislocate the hip.  After further exposure of  the proximal femur, we removed the femoral head.  I then focused on removing  the femoral component.  Careful use of a series of thin flexible osteotomes, I was able to navigate around the proximal aspect of the femur enough,  I used then the S-ROM loop  extractor and was able to remove the femoral component without significant bone loss.  Once this was completed, I directed attention to the acetabulum.  The femur was retracted  anteriorly.  Further debridement of scar tissue around the anterior third of  the hip was carried out to expose the acetabulum circumferentially.  We then used a [employer] hip explant system and was able to remove the acetabulum with minimal bone loss.  He had a previously placed 52 mm shell.  I reamed the acetabulum to 52 mm  and then packed the acetabulum and focused on the femur.  With the femur flexed and internally rotated, I then prepared the femur per protocol for the Summit Basic system.  I was able to broach up to a size 5, but still selected the size 2 component to  have adequate cement mantle.  Once we had everything adequately debrided, we irrigated the hip with 3 liters of normal saline solution.  Following this, we used a Betadine solution, which stayed in the wound for about a minute followed by the use of  IrriSept as a further irrigation with chlorhexidine.  While this was being completed, we mixed a single batch of cement with 2 grams of vancomycin and 2.4 of tobramycin.  I used this to cement the acetabular component in place.  The 32 inner diameter  Prostalac polyethylene acetabular shell was then held into position with approximately 20 degrees of forward flexion and 40 degrees of abduction until the cement fully cured.  While this was setting up, excessive cement was removed around it.  Once the  cement fully cured, I then focused on the femur.  We mixed another batch of cement with 1 gram of vancomycin and 1 of tobramycin and wrapped this around the size 2 component.  Once it had settled up, we mixed a final batch of cement with antibiotics and  cemented the proximal portion of the femoral component into the proximal femur.  We held this in position and allowed the cement to fully cure.  Once this was done, I selected a 32+5 ball, it was impacted on the clean and dried trunnion and the hip was  reduced.  We did have an audible pop as the hip ball reduced into the acetabular shell.   We reirrigated the hip with approximately 2 liters normal saline solution with pulse lavage.  We then reapproximated the iliotibial band and gluteal fascia using #1  Vicryl and #1 Stratafix suture.  The remainder of the wound was closed in layers with 2-0 Vicryl and a running Monocryl stitch.  The hip was cleaned, dried and dressed sterilely using an Aquacel dressing.  He was then brought to the recovery room in  stable condition, tolerating the procedure well.  Findings were reviewed with his wife.  Postoperatively, he will be partial weightbearing.  We will get a PICC line placed. We will ask for assistance from infectious disease in management of his antibiotics.   SHW D: 09/26/2020 7:32:36 am T: 09/26/2020 10:54:00 am  JOB: [id number]/ [id number]

## 2020-09-26 NOTE — Progress Notes (Signed)
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 PAF, MM    PT Comments    Pt progressing well. incr gait distance, pain more controlled. Difficulty with bed mobility however plans to sleep in lift chair. Reviewed precautions  Follow Up Recommendations  Follow surgeon's recommendation for DC plan and follow-up therapies     Equipment Recommendations  None recommended by PT    Recommendations for Other Services       Precautions / Restrictions Precautions Precautions: Fall, posterior THP Restrictions Weight Bearing Restrictions: Yes RLE Weight Bearing: Partial weight bearing RLE Partial Weight Bearing Percentage or Pounds: 50%    Mobility  Bed Mobility Overal bed mobility: Needs Assistance Bed Mobility: Sit to Supine;Supine to Sit     Supine to sit: Min assist Sit to supine: Min assist   General bed mobility comments: incr time and effort. cues for technique, safety, self assist. will be sleeping in lift chair at home    Transfers Overall transfer level: Needs assistance Equipment used: Rolling walker (2 wheeled) Transfers: Sit to/from Stand Sit to Stand: Min guard;Min assist         General transfer comment: cues for hand placement and RLE position  Ambulation/Gait Ambulation/Gait assistance: Min guard;Min assist Gait Distance (Feet): 1125 Feet Assistive device: Rolling walker (2 wheeled) Gait Pattern/deviations: Step-to pattern     General Gait Details: cues for sequence, PWB   Stairs             Wheelchair Mobility    Modified Rankin (Stroke Patients Only)       Balance                                             Cognition Arousal/Alertness: Awake/alert Behavior During Therapy: Anxious Overall Cognitive Status: Within Functional Limits for tasks assessed                                        Exercises      General Comments        Pertinent Vitals/Pain Pain Assessment: Faces Faces Pain Scale: Hurts little more Pain Location: right hip Pain Descriptors / Indicators: Grimacing;Aching;Guarding Pain Intervention(s): Limited activity within patient's tolerance;Monitored during session;Repositioned;Ice applied    Home Living                      Prior Function            PT Goals (current goals can now be found in the care plan section) Acute Rehab PT Goals Patient Stated Goal: home in a few days PT Goal Formulation: With patient Time For Goal Achievement: 10/02/20 Potential to Achieve Goals: Good Progress towards PT goals: Progressing toward goals    Frequency    7X/week      PT Plan Current plan remains appropriate    Co-evaluation              AM-PAC PT "6 Clicks" Mobility   Outcome Measure  Help needed  turning from your back to your side while in a flat bed without using bedrails?: A Lot Help needed moving from lying on your back to sitting on the side of a flat bed without using bedrails?: A Lot Help needed moving to and from a bed to a chair (including a wheelchair)?: A Little Help needed standing up from a chair using your arms (e.g., wheelchair or bedside chair)?: A Little Help needed to walk in hospital room?: A Little Help needed climbing 3-5 steps with a railing? : A Lot 6 Click Score: 15    End of Session Equipment Utilized During Treatment: Gait belt Activity Tolerance: Patient tolerated treatment well Patient left: in bed;with call bell/phone within reach;with bed alarm set;Other (comment) (lab present) Nurse Communication: Mobility status PT Visit Diagnosis: Other abnormalities of gait and  mobility (R26.89)     Time: 5868-2574 PT Time Calculation (min) (ACUTE ONLY): 27 min  Charges:  $Gait Training: 8-22 mins $Therapeutic Activity: 8-22 mins                     Baxter Flattery, PT  Acute Rehab Dept ([place]) [phone] Pager [phone]  09/26/2020    [facility] 09/26/2020, 4:17 PM

## 2020-09-26 NOTE — Progress Notes (Signed)
Pharmacy Antibiotic Note  Travis [name] is a 70 y.o. male admitted on 09/25/2020 with prosthetic joint infection of right hip.  Pharmacy has been consulted for vancomycin dosing.  Pt initially underwent THA in 2020; s/p right total hip arthroplasty resection with antibiotic spacer placement on 5/26.   Today, 09/26/20  WBC slightly elevated  SCr WNL, CrCl > 60 mL/min  Afebrile  Plan:  Vancomycin 2000 mg LD followed by 1000 mg IV q12h  Goal vancomycin AUC 400-550. Estimated AUC on current regimen = 443. Check levels at steady state as needed  Follow renal function and culture data  Height: 5\' 10"  (177.8 cm) Weight: 113.9 kg (251 lb) IBW/kg (Calculated) : 73  Temp (24hrs), Avg:98 F (36.7 C), Min:97.6 F (36.4 C), Max:98.7 F (37.1 C)  Recent Labs  Lab 09/26/20 0316  WBC 14.8*  CREATININE 0.69    Estimated Creatinine Clearance: 110.2 mL/min (by C-G formula based on SCr of 0.69 mg/dL).    No Known Allergies  Antimicrobials this admission: vancomycin 5/27 >>   Dose adjustments this admission:  Microbiology results: 5/26 surgical deep wound:  Thank you for allowing pharmacy to be a part of this patient's care.  Lenis Noon, PharmD 09/26/2020 8:26 AM

## 2020-09-27 ENCOUNTER — Other Ambulatory Visit: Payer: Self-pay

## 2020-09-27 DIAGNOSIS — T8451XA Infection and inflammatory reaction due to internal right hip prosthesis, initial encounter: Secondary | ICD-10-CM | POA: Diagnosis not present

## 2020-09-27 LAB — CBC
HCT: 35.4 % — ABNORMAL LOW (ref 39.0–52.0)
Hemoglobin: 11.7 g/dL — ABNORMAL LOW (ref 13.0–17.0)
MCH: 31.5 pg (ref 26.0–34.0)
MCHC: 33.1 g/dL (ref 30.0–36.0)
MCV: 95.4 fL (ref 80.0–100.0)
Platelets: 241 10*3/uL (ref 150–400)
RBC: 3.71 MIL/uL — ABNORMAL LOW (ref 4.22–5.81)
RDW: 13.2 % (ref 11.5–15.5)
WBC: 16 10*3/uL — ABNORMAL HIGH (ref 4.0–10.5)
nRBC: 0 % (ref 0.0–0.2)

## 2020-09-27 LAB — CREATININE, SERUM
Creatinine, Ser: 0.62 mg/dL (ref 0.61–1.24)
GFR, Estimated: >60 mL/min (ref >60)

## 2020-09-27 MED ORDER — POLYETHYLENE GLYCOL 3350 17 G PO PACK: 17.0000 g | PACK | Freq: Every day | ORAL | 0 refills | Status: DC | PRN

## 2020-09-27 MED ORDER — DOCUSATE SODIUM 100 MG PO CAPS
100.0000 mg | ORAL_CAPSULE | Freq: Two times a day (BID) | ORAL | 0 refills | Status: DC
Start: 2020-09-27 — End: 2020-10-20

## 2020-09-27 MED ORDER — HYDROCODONE-ACETAMINOPHEN 7.5-325 MG PO TABS: 1.0000 | ORAL_TABLET | Freq: Four times a day (QID) | ORAL | 0 refills | Status: DC | PRN

## 2020-09-27 MED ORDER — METHOCARBAMOL 500 MG PO TABS: 500.0000 mg | ORAL_TABLET | Freq: Four times a day (QID) | ORAL | 0 refills | Status: DC | PRN

## 2020-09-27 MED ORDER — HEPARIN SOD (PORK) LOCK FLUSH 100 UNIT/ML IV SOLN
250.0000 [IU] | INTRAVENOUS | Status: AC | PRN
  Administered 2020-09-27: 250 [IU]
  Filled 2020-09-27: qty 2.5

## 2020-09-27 NOTE — Progress Notes (Signed)
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   PMH: pna, HTN, DVT. PAF, MM    PT Comments    Pt progressing well with mobility. Reviewed THP, pt is apprehensive and states his wife is anxious as well about taking him home  Follow Up Recommendations  Follow surgeon's recommendation for DC plan and follow-up therapies     Equipment Recommendations  None recommended by PT    Recommendations for Other Services       Precautions / Restrictions Precautions Precautions: Fall Restrictions RLE Weight Bearing: Partial weight bearing RLE Partial Weight Bearing Percentage or Pounds: 50%    Mobility  Bed Mobility Overal bed mobility: Needs Assistance Bed Mobility: Sit to Supine;Supine to Sit     Supine to sit: Min assist Sit to supine: Min assist   General bed mobility comments: incr time and effort. cues for technique, safety, self assist. will be sleeping in lift chair at home    Transfers Overall transfer level: Needs assistance Equipment used: Rolling walker (2 wheeled) Transfers: Sit to/from Stand Sit to Stand: Min guard;Supervision         General transfer comment: cues for hand placement and RLE position/THP  Ambulation/Gait Ambulation/Gait assistance: Min Gaffer (Feet): 125 Feet Assistive device: Rolling walker (2 wheeled) Gait Pattern/deviations: Step-to pattern     General Gait Details: cues for sequence, PWB   Stairs             Wheelchair Mobility    Modified Rankin (Stroke Patients Only)       Balance                                            Cognition Arousal/Alertness:  Awake/alert Behavior During Therapy: Anxious Overall Cognitive Status: Within Functional Limits for tasks assessed                                        Exercises Total Joint Exercises Ankle Circles/Pumps: AROM;Both;10 reps Heel Slides: AAROM;Right;10 reps Hip ABduction/ADduction: AROM;AAROM;Right;10 reps Long Arc Quad: AROM;Right;10 reps;Seated    General Comments        Pertinent Vitals/Pain Faces Pain Scale: Hurts little more Pain Location: right hip Pain Descriptors / Indicators: Grimacing;Aching;Guarding    Home Living Family/patient expects to be discharged to:: Private residence Living Arrangements: Spouse/significant other                  Prior Function            PT Goals (current goals can now be found in the care plan section) Acute Rehab PT Goals Patient Stated Goal: home in a few days PT Goal Formulation: With patient Time For Goal Achievement: 10/02/20 Potential to Achieve Goals: Good Progress towards PT goals: Progressing toward goals    Frequency    7X/week      PT Plan Current plan remains appropriate    Co-evaluation              AM-PAC PT "6 Clicks" Mobility  Outcome Measure  Help needed turning from your back to your side while in a flat bed without using bedrails?: A Lot Help needed moving from lying on your back to sitting on the side of a flat bed without using bedrails?: A Lot Help needed moving to and from a bed to a chair (including a wheelchair)?: A Little Help needed standing up from a chair using your arms (e.g., wheelchair or bedside chair)?: A Little Help needed to walk in hospital room?: A Little Help needed climbing 3-5 steps with a railing? : A Lot 6 Click Score: 15    End of Session Equipment Utilized During Treatment: Gait belt Activity Tolerance: Patient tolerated treatment well Patient left: with call bell/phone within reach;in chair;with chair alarm set Nurse Communication: Mobility  status PT Visit Diagnosis: Other abnormalities of gait and mobility (R26.89)     Time: 3568-6168 PT Time Calculation (min) (ACUTE ONLY): 20 min  Charges:  $Gait Training: 8-22 mins                     Baxter Flattery, PT  Acute Rehab Dept ([place]) [phone] Pager [phone]  09/27/2020    [facility] 09/27/2020, 10:03 AM

## 2020-09-27 NOTE — Plan of Care (Signed)
  Problem: Activity: Goal: Ability to avoid complications of mobility impairment will improve Outcome: Progressing   Problem: Activity: Goal: Ability to tolerate increased activity will improve Outcome: Progressing   Problem: Pain Management: Goal: Pain level will decrease with appropriate interventions Outcome: Progressing   

## 2020-09-27 NOTE — Progress Notes (Signed)
   09/27/20 1400  PT Visit Information  Last PT Received On 09/27/20  Assistance Needed +1  Pt continues to progress with mobility. Reviewed stairs as noted below. Pt is motivated to d/c home today, dependent on his wife being in agreement.   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   PMH: pna, HTN, DVT. PAF, MM  Subjective Data  Patient Stated Goal home in a few days  Precautions  Precautions Fall  Restrictions  RLE Weight Bearing PWB  RLE Partial Weight Bearing Percentage or Pounds 50%  Pain Assessment  Pain Assessment 0-10  Pain Score 6  Pain Location right hip  Pain Descriptors / Indicators Grimacing;Aching;Guarding  Pain Intervention(s) Limited activity within patient's tolerance;Monitored during session;Repositioned  Cognition  Arousal/Alertness Awake/alert  Behavior During Therapy Anxious  Overall Cognitive Status Within Functional Limits for tasks assessed  Bed Mobility  General bed mobility comments in recliner  Transfers  Overall transfer level Needs assistance  Equipment used Rolling walker (2 wheeled)  Transfers Sit to/from Stand  Sit to Stand Min guard;Supervision  General transfer comment cues for hand placement and RLE position/THP, wt shift to RLE; incr time and effort  Ambulation/Gait  Ambulation/Gait assistance Min guard;Supervision  Gait Distance (Feet) 50 Feet  Assistive device Rolling walker (2 wheeled)  Gait Pattern/deviations Step-to pattern  General Gait Details cues for sequence, PWB  Stairs Yes  Stairs assistance Min assist;Min guard  Stair Management Step to pattern;Forwards;One rail Right;With cane  Number of Stairs 3  General stair comments cues for sequence, technique, PWB  Balance  Standing balance-Leahy Scale Poor  Standing balance comment  reliant on UEs  PT - End of Session  Equipment Utilized During Treatment Gait belt  Activity Tolerance Patient tolerated treatment well  Patient left with call bell/phone within reach;in chair;with chair alarm set  Nurse Communication Mobility status   PT - Assessment/Plan  PT Plan Current plan remains appropriate  PT Visit Diagnosis Other abnormalities of gait and mobility (R26.89)  PT Frequency (ACUTE ONLY) 7X/week  Follow Up Recommendations Follow surgeon's recommendation for DC plan and follow-up therapies  PT equipment None recommended by PT  AM-PAC PT "6 Clicks" Mobility Outcome Measure (Version 2)  Help needed turning from your back to your side while in a flat bed without using bedrails? 2  Help needed moving from lying on your back to sitting on the side of a flat bed without using bedrails? 2  Help needed moving to and from a bed to a chair (including a wheelchair)? 3  Help needed standing up from a chair using your arms (e.g., wheelchair or bedside chair)? 3  Help needed to walk in hospital room? 3  Help needed climbing 3-5 steps with a railing?  2  6 Click Score 15  Consider Recommendation of Discharge To: CIR/SNF/LTACH  PT Goal Progression  Progress towards PT goals Progressing toward goals  Acute Rehab PT Goals  PT Goal Formulation With patient  Time For Goal Achievement 10/02/20  Potential to Achieve Goals Good  PT Time Calculation  PT Start Time (ACUTE ONLY) 1331  PT Stop Time (ACUTE ONLY) 1358  PT Time Calculation (min) (ACUTE ONLY) 27 min  PT General Charges  $$ ACUTE PT VISIT 1 Visit  PT Treatments  $Gait Training 23-37 mins

## 2020-09-27 NOTE — Progress Notes (Signed)
Subjective: 2 Days Post-Op Procedure(s) (LRB): EXCISIONAL TOTAL HIP ARTHROPLASTY WITH ANTIBIOTIC SPACERS (Right) Patient reports pain as moderate.   Had PICC line placed yesterday. Has met with [facility] team, wife instructed on PICC line use.  Has been up with therapy.  +void, feels like he is emptying bladder completely. +flatus.  States he would like to go home today if possible, but his wife has expressed concern that she may feel more comfortable if he stays another night.   Objective: Vital signs in last 24 hours: Temp:  [98 F (36.7 C)-98.6 F (37 C)] 98.3 F (36.8 C) (05/28 0558) Pulse Rate:  [57-84] 84 (05/28 0558) Resp:  [16-20] 16 (05/28 0558) BP: (126-149)/(61-84) 149/84 (05/28 0558) SpO2:  [95 %-98 %] 98 % (05/28 0558)  Intake/Output from previous day: 05/27 0701 - 05/28 0700 In: 2251.6 [P.O.:960; I.V.:592.4; IV Piggyback:699.2] Out: 2400 [Urine:2400] Intake/Output this shift: Total I/O In: 240 [P.O.:240] Out: 400 [Urine:400]  Recent Labs    09/26/20 0316 09/27/20 0333  HGB 12.6* 11.7*   Recent Labs    09/26/20 0316 09/27/20 0333  WBC 14.8* 16.0*  RBC 4.00* 3.71*  HCT 37.7* 35.4*  PLT 265 241   Recent Labs    09/26/20 0316 09/27/20 0333  NA 137  --   K 3.8  --   CL 101  --   CO2 25  --   BUN 17  --   CREATININE 0.69 0.62  GLUCOSE 142*  --   CALCIUM 8.7*  --    No results for input(s): LABPT, INR in the last 72 hours.  Neurologically intact ABD soft Neurovascular intact Sensation intact distally Intact pulses distally Dorsiflexion/Plantar flexion intact Incision: dressing C/D/I No cellulitis present Compartment soft   Assessment/Plan: 2 Days Post-Op Procedure(s) (LRB): EXCISIONAL TOTAL HIP ARTHROPLASTY WITH ANTIBIOTIC SPACERS (Right) Advanced diet Up with therapy. PWB. Continue abx as directed by ID. PICC line placed. HHRN being arranged.   Possible D/C today if patient clears PT. Patient and wife worried he will not be able to get  out of vehicle and into home due to steps. I reassured patient that "clearing" PT would mean that he could safely use assistive devices to get into and around his home. May consider keeping patient another night if needed for additional PT.     Charlyne Petrin 09/27/2020, 8:27 AM

## 2020-09-27 NOTE — Progress Notes (Signed)
Estill [facility] notified that pt wants to go home and wife is agreeable to d/c as long as pt can go with non emergent ems transport. Rn notified social of need for ems transport. Rn will place order for IV team to secure picc line is ready for d/c as well. Rn will continue to monitor.

## 2020-09-27 NOTE — Progress Notes (Signed)
Pt stable at time of transport home via ems. No needs at time of transport.

## 2020-09-30 LAB — AEROBIC/ANAEROBIC CULTURE W GRAM STAIN (SURGICAL/DEEP WOUND)
Culture: NO GROWTH
Culture: NO GROWTH
Gram Stain: NONE SEEN
Gram Stain: NONE SEEN

## 2020-09-30 LAB — CYTOLOGY - NON PAP

## 2020-10-01 DIAGNOSIS — T8451XA Infection and inflammatory reaction due to internal right hip prosthesis, initial encounter: Secondary | ICD-10-CM | POA: Diagnosis not present

## 2020-10-01 DIAGNOSIS — T8452XA Infection and inflammatory reaction due to internal left hip prosthesis, initial encounter: Secondary | ICD-10-CM | POA: Diagnosis not present

## 2020-10-02 ENCOUNTER — Telehealth: Payer: Self-pay | Admitting: Hematology and Oncology

## 2020-10-02 NOTE — Telephone Encounter (Signed)
R/s per prov pal,per 1/13 los, pt aware.

## 2020-10-03 DIAGNOSIS — T8451XA Infection and inflammatory reaction due to internal right hip prosthesis, initial encounter: Secondary | ICD-10-CM | POA: Diagnosis not present

## 2020-10-06 ENCOUNTER — Telehealth: Payer: Self-pay

## 2020-10-06 ENCOUNTER — Encounter: Payer: Medicare Other | Attending: Geriatric Medicine

## 2020-10-06 DIAGNOSIS — R7303 Prediabetes: Secondary | ICD-10-CM | POA: Insufficient documentation

## 2020-10-06 NOTE — Telephone Encounter (Signed)
Received voicemail call from nurse with [facility], Travis [name], as patient is calling their office to report increased kidney pain and rash along his waist after increased vancomycin dosage. Spoke with Dr. Tommy Medal and he request patient be evaluated by home health nursing. Unable to reach patient by phone and left a voicemail to call [facility] for more information.  Travis [name]

## 2020-10-07 DIAGNOSIS — T8452XA Infection and inflammatory reaction due to internal left hip prosthesis, initial encounter: Secondary | ICD-10-CM | POA: Diagnosis not present

## 2020-10-07 DIAGNOSIS — T8451XA Infection and inflammatory reaction due to internal right hip prosthesis, initial encounter: Secondary | ICD-10-CM | POA: Diagnosis not present

## 2020-10-09 DIAGNOSIS — T8452XA Infection and inflammatory reaction due to internal left hip prosthesis, initial encounter: Secondary | ICD-10-CM | POA: Diagnosis not present

## 2020-10-09 DIAGNOSIS — T8451XA Infection and inflammatory reaction due to internal right hip prosthesis, initial encounter: Secondary | ICD-10-CM | POA: Diagnosis not present

## 2020-10-09 NOTE — Telephone Encounter (Signed)
I would recommend switch to daptomycin but will CC Dr. Linus Salmons who saw the patient in the hospital

## 2020-10-10 NOTE — Telephone Encounter (Signed)
Call placed to Short Stay and left voicemail for 1st dosing scheduling needs. Patient needs first dosing at short stay. Staff to obtain written orders [facility] will run insurance and update [facility] and patient with cost per day.  Travis [name]

## 2020-10-10 NOTE — Telephone Encounter (Signed)
Mary from [facility] called with copay information for daptomycin. It is $47.40/day. Per calculations, this is 910 mg/day.  Stanton Kidney will call patient with this information and will call back Monday to follow up with [facility]. Landis Gandy, RN

## 2020-10-11 DIAGNOSIS — T8451XA Infection and inflammatory reaction due to internal right hip prosthesis, initial encounter: Secondary | ICD-10-CM | POA: Diagnosis not present

## 2020-10-12 NOTE — Discharge Summary (Signed)
Physician Discharge Summary   Patient ID: Travis [name] MRN: [id number] DOB/AGE: 70-27-1952 70 y.o.  Admit date: 09/25/2020 Discharge date: 09/27/2020  Primary Diagnosis:Infected right total hip arthroplasty.  Admission Diagnoses:  Past Medical History:  Diagnosis Date   Arthritis    "comes and goes; mostly in hands, occasionally elbow" (35/08/6566)   Complication of anesthesia    "just wanted to sleep alot  for couple days S/P VARICOCELE OR" (04/05/2017)   DVT (deep venous thrombosis) (HCC)    LLE   Dysrhythmia    PVCs    GERD (gastroesophageal reflux disease)    History of hiatal hernia    Hypertension    Impaired glucose tolerance    Myeloma ([place])    Obesity (BMI 30-39.9) 07/26/2016   OSA on CPAP 07/26/2016   Mild with AHI 12.5/hr now on CPAP at 14cm H2O   PAF (paroxysmal atrial fibrillation) ([place])    s/p PVI Ablation in 2018 // Flecainide Rx // Echo 8/21: EF 60-65, no RWMA, mild LVH, normal RV SF, moderate RVE, mild BAE, trivial MR, mild dilation of aortic root (40 mm)   Pneumonia    "may have had walking pneumonia a few years ago" (04/05/2017)   Pre-diabetes    Thrombophlebitis    Discharge Diagnoses:   Active Problems:   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   Weight as of this encounter: 113.9 kg.  Procedure:  Procedure(s) (LRB): EXCISIONAL TOTAL HIP ARTHROPLASTY WITH ANTIBIOTIC SPACERS (Right)   Consults: ID  HPI: The patient is a 70 year old male with a history of right total hip arthroplasty performed in 01/2019.  He had presented to the office with routine followup and concern for pain.  Given his clinical and radiographic appearance, it did not appear that it was typical for the pain level and dysfunction he had, so we worked him up for infection.  His sedimentation rate and C-reactive protein were elevated.  Hip aspiration  revealed elevated white blood cell count.   However, culture did not grow any bacteria.   Given these findings and his pain, I felt that it was in his best interest to proceed with excision of his right hip and treat this as a two-stage treatment.  I reviewed the pros and cons and the risks and benefits of single stage versus two-stage treatment given that he was this far out from his index surgery.  Consent was obtained for management of infection.  Laboratory Data: Admission on 09/25/2020, Discharged on 09/27/2020  Component Date Value Ref Range Status   Specimen Description 09/25/2020    Final                   Value:SYNOVIAL RT HIP Performed at [facility], [place] [address] [place], [place] [address]    Special Requests 09/25/2020    Final                   Value:NONE Performed at [facility], [place] [address] [place], [place] [address]    Gram Stain 09/25/2020    Final                   Value:NO WBC SEEN NO ORGANISMS SEEN    Culture 09/25/2020    Final  Value:No growth aerobically or anaerobically. Performed at [facility], [place] [address] [place], [place] [address]    Report Status 09/25/2020 09/30/2020 FINAL   Final   Specimen Description 09/25/2020    Final                   Value:TISSUE RT HIP Performed at [facility], [place] [address], [place], [place] [address]    Special Requests 09/25/2020    Final                   Value:NONE Performed at [facility], [place] [address], [place], [place] [address]    Gram Stain 09/25/2020    Final                   Value:NO WBC SEEN NO ORGANISMS SEEN    Culture 09/25/2020    Final                   Value:No growth aerobically or anaerobically. Performed at [facility], [place] [address] [place], [place] [address]    Report Status 09/25/2020 09/30/2020 FINAL   Final   CYTOLOGY - NON GYN 09/25/2020    Final-Edited                    Value:CYTOLOGY - NON PAP CASE: [id number] PATIENT: Travis [name] Non-Gynecological Cytology Report     Clinical History: Infected right total hip arthroplasty Specimen Submitted:  A. JOINT FLUID, RIGHT HIP, FINE NEEDLE ASPIRATION:   FINAL MICROSCOPIC DIAGNOSIS: - No malignant cells identified - Blood and acellular debris  SPECIMEN ADEQUACY: Satisfactory for evaluation  GROSS: Received is/are 8 ccs of bloody red fluid in syringe. (CM:cm) Prepared: Smears:  0 Concentration Method (Thin Prep):  1 Cell Block:  1 Additional Studies:  n/a     Final Diagnosis performed by Vicente Males, MD.   Electronically signed 09/30/2020 Technical and / or Professional components performed at [facility], [place] [address], [place], [place] [address].  Immunohistochemistry Technical component (if applicable) was performed at [facility]. [address], [place], [place] [address].   IMMUNOHISTOCHEMISTRY DISCLAIMER (if applicable):                          Some of these immunohistochemical stains may have been developed and the performance characteristics determine by [facility]. Some may not have been cleared or approved by the U.S. Food and Drug Administration. The FDA has determined that such clearance or approval is not necessary. This test is used for clinical purposes. It should not be regarded as investigational or for research. This laboratory is certified under the [place] (CLIA-88) as qualified to perform high complexity clinical laboratory testing.  The controls stained appropriately.    Color, Synovial 09/25/2020 RED (A) YELLOW Final   Performed at [facility], [place] [address], [place], [place] [address]   Appearance-Synovial 09/25/2020 TURBID (A) CLEAR Final   Performed at [facility], [place] [address], [place], [place] [address]    Crystals, Fluid 09/25/2020 NO CRYSTALS SEEN   Final   Performed at [place] [address], [place], [place] [address]   WBC, Synovial 09/25/2020 UNABLE TO PERFORM COUNT DUE TO CLOT IN SPECIMEN  0 - 200 /cu mm Final   Performed at [facility],  [place] [address], [place], [place] [address]   Other Cells-SYN 09/25/2020 TOO FEW TO COUNT, SMEAR AVAILABLE FOR REVIEW   Final   Comment: FEW NEUTROPHILS RARE LYMPHOCYTES AND MONOCYTES Performed at [facility], [place] [address], [place], [place] [address]    WBC 09/26/2020 14.8 (A) 4.0 - 10.5 K/uL Final   RBC 09/26/2020 4.00 (A) 4.22 - 5.81 MIL/uL Final   Hemoglobin 09/26/2020 12.6 (A) 13.0 - 17.0 g/dL Final   HCT 09/26/2020 37.7 (A) 39.0 - 52.0 % Final   MCV 09/26/2020 94.3  80.0 - 100.0 fL Final   MCH 09/26/2020 31.5  26.0 - 34.0 pg Final   MCHC 09/26/2020 33.4  30.0 - 36.0 g/dL Final   RDW 09/26/2020 13.0  11.5 - 15.5 % Final   Platelets 09/26/2020 265  150 - 400 K/uL Final   nRBC 09/26/2020 0.0  0.0 - 0.2 % Final   Performed at [place] [address], [place], [place] [address]   Sodium 09/26/2020 137  135 - 145 mmol/L Final   Potassium 09/26/2020 3.8  3.5 - 5.1 mmol/L Final   Chloride 09/26/2020 101  98 - 111 mmol/L Final   CO2 09/26/2020 25  22 - 32 mmol/L Final   Glucose, Bld 09/26/2020 142 (A) 70 - 99 mg/dL Final   Glucose reference range applies only to samples taken after fasting for at least 8 hours.   BUN 09/26/2020 17  8 - 23 mg/dL Final   Creatinine, Ser 09/26/2020 0.69  0.61 - 1.24 mg/dL Final   Calcium 09/26/2020 8.7 (A) 8.9 - 10.3 mg/dL Final   GFR, Estimated 09/26/2020 >60  >60 mL/min Final   Comment: (NOTE) Calculated using the CKD-EPI Creatinine Equation (2021)    Anion gap 09/26/2020 11  5 - 15 Final   Performed at [facility], [place] [address], [place], [place] [address]   Glucose-Capillary 09/26/2020 118 (A) 70 - 99 mg/dL Final    Glucose reference range applies only to samples taken after fasting for at least 8 hours.   Sed Rate 09/26/2020 52 (A) 0 - 16 mm/hr Final   Performed at [facility], [place] [address], [place], [place] [address]   CRP 09/26/2020 6.1 (A) <1.0 mg/dL Final   Performed at [place] [address] [place], [place] [address]   WBC 09/27/2020 16.0 (A) 4.0 - 10.5 K/uL Final   RBC 09/27/2020 3.71 (A) 4.22 - 5.81 MIL/uL Final   Hemoglobin 09/27/2020 11.7 (A) 13.0 - 17.0 g/dL Final   HCT 09/27/2020 35.4 (A) 39.0 - 52.0 % Final   MCV 09/27/2020 95.4  80.0 - 100.0 fL Final   MCH 09/27/2020 31.5  26.0 - 34.0 pg Final   MCHC 09/27/2020 33.1  30.0 - 36.0 g/dL Final   RDW 09/27/2020 13.2  11.5 - 15.5 % Final   Platelets 09/27/2020 241  150 - 400 K/uL Final   nRBC 09/27/2020 0.0  0.0 - 0.2 % Final   Performed at [place] [address], [place], [place] [address]   Creatinine, Ser 09/27/2020 0.62  0.61 - 1.24 mg/dL Final   GFR, Estimated 09/27/2020 >60  >60 mL/min Final   Comment: (NOTE) Calculated using the CKD-EPI Creatinine Equation (2021) Performed at [facility], [place] [address] [place], [place] [address]   Hospital Outpatient Visit on 09/22/2020  Component Date Value Ref Range Status   SARS Coronavirus 2 09/22/2020 NEGATIVE  NEGATIVE  Final   Comment: (NOTE) SARS-CoV-2 target nucleic acids are NOT DETECTED.  The SARS-CoV-2 RNA is generally detectable in upper and lower respiratory specimens during the acute phase of infection. Negative results do not preclude SARS-CoV-2 infection, do not rule out co-infections with other pathogens, and should not be used as the sole basis for treatment or other patient management decisions. Negative results must be combined with clinical observations, patient history, and epidemiological information. The expected result is Negative.  Fact Sheet for  Patients: SugarRoll.be  Fact Sheet for Healthcare Providers: https://www.woods-mathews.com/  This test is not yet approved or cleared by the [place] FDA and  has been authorized for detection and/or diagnosis of SARS-CoV-2 by FDA under an Emergency Use Authorization (EUA). This EUA will remain  in effect (meaning this test can be used) for the duration of the COVID-19 declaration under Se                          ction 564(b)(1) of the Act, 21 U.S.C. section 360bbb-3(b)(1), unless the authorization is terminated or revoked sooner.  Performed at [facility], [place] [address], [place], [place] [address]   Hospital Outpatient Visit on 09/18/2020  Component Date Value Ref Range Status   MRSA, PCR 09/18/2020 NEGATIVE  NEGATIVE Final   Staphylococcus aureus 09/18/2020 NEGATIVE  NEGATIVE Final   Comment: (NOTE) The Xpert SA Assay (FDA approved for NASAL specimens in patients 48 years of age and older), is one component of a comprehensive surveillance program. It is not intended to diagnose infection nor to guide or monitor treatment. Performed at [facility], [place] [address], [place], [place] [address]    WBC 09/18/2020 8.4  4.0 - 10.5 K/uL Final   RBC 09/18/2020 4.50  4.22 - 5.81 MIL/uL Final   Hemoglobin 09/18/2020 14.4  13.0 - 17.0 g/dL Final   HCT 09/18/2020 42.7  39.0 - 52.0 % Final   MCV 09/18/2020 94.9  80.0 - 100.0 fL Final   MCH 09/18/2020 32.0  26.0 - 34.0 pg Final   MCHC 09/18/2020 33.7  30.0 - 36.0 g/dL Final   RDW 09/18/2020 13.0  11.5 - 15.5 % Final   Platelets 09/18/2020 298  150 - 400 K/uL Final   nRBC 09/18/2020 0.0  0.0 - 0.2 % Final   Performed at [facility], [place] [address], [place], [place] [address]   Sodium 09/18/2020 141  135 - 145 mmol/L Final   Potassium 09/18/2020 3.9  3.5 - 5.1 mmol/L Final   Chloride 09/18/2020 101  98 - 111 mmol/L Final   CO2 09/18/2020 29  22 -  32 mmol/L Final   Glucose, Bld 09/18/2020 91  70 - 99 mg/dL Final   Glucose reference range applies only to samples taken after fasting for at least 8 hours.   BUN 09/18/2020 16  8 - 23 mg/dL Final   Creatinine, Ser 09/18/2020 0.63  0.61 - 1.24 mg/dL Final   Calcium 09/18/2020 9.3  8.9 - 10.3 mg/dL Final   Total Protein 09/18/2020 7.5  6.5 - 8.1 g/dL Final   Albumin 09/18/2020 4.1  3.5 - 5.0 g/dL Final   AST 09/18/2020 28  15 - 41 U/L Final   ALT 09/18/2020 25  0 - 44 U/L Final   Alkaline Phosphatase 09/18/2020 85  38 - 126 U/L Final   Total Bilirubin 09/18/2020 0.7  0.3 - 1.2 mg/dL Final   GFR, Estimated 09/18/2020 >  60  >60 mL/min Final   Comment: (NOTE) Calculated using the CKD-EPI Creatinine Equation (2021)    Anion gap 09/18/2020 11  5 - 15 Final   Performed at [facility], [place] [address] [place], [place] [address]   Prothrombin Time 09/18/2020 14.2  11.4 - 15.2 seconds Final   INR 09/18/2020 1.1  0.8 - 1.2 Final   Comment: (NOTE) INR goal varies based on device and disease states. Performed at [facility], [place] [address], [place], [place] [address]    aPTT 09/18/2020 36  24 - 36 seconds Final   Performed at [facility], [place] [address] [place], [place] [address]   ABO/RH(D) 09/18/2020 O POS   Final   Antibody Screen 09/18/2020 NEG   Final   Sample Expiration 09/18/2020 09/28/2020,2359   Final   Extend sample reason 09/18/2020    Final                   Value:NO TRANSFUSIONS OR PREGNANCY IN THE PAST 3 MONTHS Performed at [place] [address] [place], [place] [address]    Hgb A1c MFr Bld 09/18/2020 5.8 (A) 4.8 - 5.6 % Final   Comment: (NOTE) Pre diabetes:          5.7%-6.4%  Diabetes:              >6.4%  Glycemic control for   <7.0% adults with diabetes    Mean Plasma Glucose 09/18/2020 119.76  mg/dL Final   Performed at [facility], [place] [address] [place], [place] [address]   Hospital Outpatient Visit on 08/15/2020  Component Date Value Ref Range Status   MICRO NUMBER: 08/15/2020 [id number]   Final   SPECIMEN QUALITY: 08/15/2020 Adequate   Final   Source: 08/15/2020 RT HIP ASPIRATE   Final   STATUS: 08/15/2020 FINAL   Final   GRAM STAIN: 08/15/2020 No organisms or white blood cells seen   Final   ANA RESULT: 08/15/2020 No anaerobes isolated.   Final   MICRO NUMBER: 08/15/2020 [id number]   Final   SPECIMEN QUALITY: 08/15/2020 Adequate   Final   SOURCE: 08/15/2020 RT HIP ASPIRATE   Final   STATUS: 08/15/2020 FINAL   Final   AER RESULT: 08/15/2020 No Growth   Final   Site 08/15/2020 RT HIP ASPIRATE   Final   Color, Synovial 08/15/2020 YELLOW  STRAW/YELL Final   Appearance-Synovial 08/15/2020 TURBID (A) CLEAR/HAZY Final   WBC, Synovial 08/15/2020 15,828 (A) <150 cells/uL Final   Neutrophil, Synovial 08/15/2020 51 (A) 0 - 24 % Final   Lymphocytes-Synovial Fld 08/15/2020 27  0 - 74 % Final   Monocyte/Macrophage 08/15/2020 22  0 - 69 % Final   Eosinophils-Synovial 08/15/2020 0  0 - 2 % Final   Basophils, % 08/15/2020 0  0 % Final   Synoviocytes, % 08/15/2020 0  0 - 15 % Final     X-Rays:DG Pelvis Portable  Result Date: 09/25/2020 CLINICAL DATA:  Status post right hip replacement. EXAM: PORTABLE PELVIS 1-2 VIEWS COMPARISON:  None. FINDINGS: Redo right hip arthroplasty in expected alignment. Femoral component is metallic, the acetabular component is non radiopaque. No periprosthetic lucency or fracture. Recent postsurgical change includes air and edema in the joint space and soft tissues. IMPRESSION: Right hip arthroplasty in expected alignment. Electronically Signed   By: Keith Rake M.D.   On: 09/25/2020 14:54   [place] EKG SITE RITE  Result Date: 09/25/2020  If [employer] not attached, placement could not be confirmed due to current cardiac rhythm.   EKG: Orders placed or performed during the hospital encounter of 09/18/20   EKG 12-Lead   EKG 12-Lead      Hospital Course: Travis [name] is a 70 y.o. who was admitted to [facility]. They were brought to the operating room on 09/25/2020 and underwent Procedure(s): EXCISIONAL TOTAL HIP ARTHROPLASTY WITH ANTIBIOTIC SPACERS.  Patient tolerated the procedure well and was later transferred to the recovery room and then to the orthopaedic floor for postoperative care. They were given PO and IV analgesics for pain control following their surgery. They were given 24 hours of postoperative antibiotics of  Anti-infectives (From admission, onward)    Start     Dose/Rate Route Frequency Ordered Stop   09/26/20 2200  vancomycin (VANCOREADY) IVPB 1000 mg/200 mL  Status:  Discontinued        1,000 mg 200 mL/hr over 60 Minutes Intravenous Every 12 hours 09/26/20 0825 09/27/20 2325   09/26/20 1445  cefTRIAXone (ROCEPHIN) 2 g in sodium chloride 0.9 % 100 mL IVPB  Status:  Discontinued        2 g 200 mL/hr over 30 Minutes Intravenous Every 24 hours 09/26/20 1348 09/27/20 2325   09/26/20 0900  vancomycin (VANCOREADY) IVPB 2000 mg/400 mL        2,000 mg 200 mL/hr over 120 Minutes Intravenous  Once 09/26/20 0812 09/26/20 1051   09/26/20 0000  cefTRIAXone (ROCEPHIN) IVPB        2 g Intravenous Every 24 hours 09/26/20 1429 11/07/20 2359   09/26/20 0000  vancomycin IVPB        1,000 mg Intravenous Every 12 hours 09/26/20 1429 11/07/20 2359   09/25/20 1700  ceFAZolin (ANCEF) IVPB 2g/100 mL premix        2 g 200 mL/hr over 30 Minutes Intravenous Every 6 hours 09/25/20 1556 09/25/20 2310   09/25/20 1229  tobramycin (NEBCIN) powder  Status:  Discontinued          As needed 09/25/20 1231 09/25/20 1413   09/25/20 1229  vancomycin (VANCOCIN) powder  Status:  Discontinued          As needed 09/25/20 1231 09/25/20 1413   09/25/20 0830  ceFAZolin (ANCEF) IVPB 2g/100 mL premix        2 g 200 mL/hr over 30 Minutes Intravenous On call to O.R. 09/25/20 0815 09/25/20 1110      and started on DVT prophylaxis in the  form of  Eliquis .   PT and OT were ordered for total joint protocol. Discharge planning consulted to help with postop disposition and equipment needs. Patient had a good night on the evening of surgery. They started to get up OOB with therapy on POD #1. PICC line placed, and infectious disease consulted for assistance with outpatient ABX management. ID recommended Vancomycin and Ceftriaxone. Continued to work with therapy into POD #2. Pt was seen during rounds on day two and was ready to go home pending progress with therapy. Pt worked with therapy for two additional sessions and was meeting their goals. He was discharged to home later that day in stable condition. HHRN was arranged for assistance with IV abx.  Diet: Regular diet Activity: PWB Follow-up: in 2 weeks Disposition: Home Discharged Condition: good   Discharge Instructions     [facility] pharmacist to adjust dose for Vancomycin, Aminoglycosides and other anti-infective therapies as requested by physician.  Complete by: As directed    [facility] to provide Cath Flo 4m   Complete by: As directed    Administer for PICC line occlusion and as ordered by physician for other access device issues.   Anaphylaxis Kit: Provided to treat any anaphylactic reaction to the medication being provided to the patient if First Dose or when requested by physician   Complete by: As directed    Epinephrine 168mml vial / amp: Administer 0.44m47m0.44ml41mubcutaneously once for moderate to severe anaphylaxis, nurse to call physician and pharmacy when reaction occurs and call 911 if needed for immediate care   Diphenhydramine 50mg37mIV vial: Administer 25-50mg 62mM PRN for first dose reaction, rash, itching, mild reaction, nurse to call physician and pharmacy when reaction occurs   Sodium Chloride 0.9% NS 500ml I86mdminister if needed for hypovolemic blood pressure drop or as ordered by physician after call to physician with  anaphylactic reaction   Call MD / Call 911   Complete by: As directed    If you experience chest pain or shortness of breath, CALL 911 and be transported to the hospital emergency room.  If you develope a fever above 101 F, pus (white drainage) or increased drainage or redness at the wound, or calf pain, call your surgeon's office.   Change dressing   Complete by: As directed    Maintain surgical dressing until follow up in the clinic. If the edges start to pull up, may reinforce with tape. If the dressing is no longer working, may remove and cover with gauze and tape, but must keep the area dry and clean.  Call with any questions or concerns.   Change dressing on IV access line weekly and PRN   Complete by: As directed    Constipation Prevention   Complete by: As directed    Drink plenty of fluids.  Prune juice may be helpful.  You may use a stool softener, such as Colace (over the counter) 100 mg twice a day.  Use MiraLax (over the counter) for constipation as needed.   Diet - low sodium heart healthy   Complete by: As directed    Flush IV access with Sodium Chloride 0.9% and Heparin 10 units/ml or 100 units/ml   Complete by: As directed    Home infusion instructions - [facility]   Complete by: As directed    Instructions: Flush IV access with Sodium Chloride 0.9% and Heparin 10units/ml or 100units/ml   Change dressing on IV access line: Weekly and PRN   Instructions Cath Flo 2mg: Ad37mister for PICC Line occlusion and as ordered by physician for other access device   [facility] pharmacist to adjust dose for: Vancomycin, Aminoglycosides and other anti-infective therapies as requested by physician   Increase activity slowly as tolerated   Complete by: As directed    Partial weight bearing with assist device as directed.   50% weight bearing on the right leg.   Method of administration may be changed at the discretion of home infusion pharmacist based upon assessment  of the patient and/or caregiver's ability to self-administer the medication ordered   Complete by: As directed    Post-operative opioid taper instructions:   Complete by: As directed    POST-OPERATIVE OPIOID TAPER INSTRUCTIONS: It is important to wean off of your opioid medication as soon as possible. If you do not need pain medication after your surgery it is ok to stop day one. Opioids include: Codeine, Hydrocodone(Norco,  Vicodin), Oxycodone(Percocet, oxycontin) and hydromorphone amongst others.  Long term and even short term use of opiods can cause: Increased pain response Dependence Constipation Depression Respiratory depression And more.  Withdrawal symptoms can include Flu like symptoms Nausea, vomiting And more Techniques to manage these symptoms Hydrate well Eat regular healthy meals Stay active Use relaxation techniques(deep breathing, meditating, yoga) Do Not substitute Alcohol to help with tapering If you have been on opioids for less than two weeks and do not have pain than it is ok to stop all together.  Plan to wean off of opioids This plan should start within one week post op of your joint replacement. Maintain the same interval or time between taking each dose and first decrease the dose.  Cut the total daily intake of opioids by one tablet each day Next start to increase the time between doses. The last dose that should be eliminated is the evening dose.      TED hose   Complete by: As directed    Use stockings (TED hose) for 2 weeks on both leg(s).  You may remove them at night for sleeping.      Allergies as of 09/27/2020   No Known Allergies      Medication List     STOP taking these medications    acetaminophen 500 MG tablet Commonly known as: TYLENOL   methotrexate (PF) 50 MG/2ML injection       TAKE these medications    amLODipine 10 MG tablet Commonly known as: NORVASC Take 10 mg by mouth daily.   apixaban 5 MG Tabs  tablet Commonly known as: Eliquis Take 1 tablet (5 mg total) by mouth 2 (two) times daily.   b complex vitamins tablet Take 1 tablet by mouth daily.   cefTRIAXone  IVPB Commonly known as: ROCEPHIN Inject 2 g into the vein daily. Indication:  Prosthetic hip infection  First Dose: Yes Last Day of Therapy:  11/07/20 Labs - Once weekly:  CBC/D and BMP, Labs - Every other week:  ESR and CRP Method of administration: IV Push Method of administration may be changed at the discretion of home infusion pharmacist based upon assessment of the patient and/or caregiver's ability to self-administer the medication ordered.   cholecalciferol 25 MCG (1000 UNIT) tablet Commonly known as: VITAMIN D3 Take 1,000 Units by mouth daily.   cyanocobalamin 1000 MCG/ML injection Commonly known as: (VITAMIN B-12) Inject 1,000 mcg into the skin every 30 (thirty) days.   diltiazem 30 MG tablet Commonly known as: CARDIZEM Take 1 Tablet Every 4 Hours As Needed For Afib HR Over 100. If using frequently please call our office [phone] What changed:  how much to take how to take this when to take this reasons to take this additional instructions   docusate sodium 100 MG capsule Commonly known as: COLACE Take 1 capsule (100 mg total) by mouth 2 (two) times daily.   Fish Oil 1000 MG Caps Take 1,000 mg by mouth daily.   flecainide 50 MG tablet Commonly known as: TAMBOCOR Take 1.5 tablets (75 mg total) by mouth 2 (two) times daily.   fluticasone 50 MCG/ACT nasal spray Commonly known as: FLONASE Place 1 spray into both nostrils daily as needed for allergies or rhinitis.   folic acid 1 MG tablet Commonly known as: FOLVITE Take 1 mg by mouth daily.   gabapentin 300 MG capsule Commonly known as: NEURONTIN TAKE TWO CAPSULES BY MOUTH TWICE A DAY   Glucosamine 1500 Complex Caps Take 1  capsule by mouth See admin instructions. Take 1 capsule daily for 1 week then off for 1 week then repeat    hydrochlorothiazide 25 MG tablet Commonly known as: HYDRODIURIL Take 1 tablet (25 mg total) by mouth daily.   HYDROcodone-acetaminophen 7.5-325 MG tablet Commonly known as: NORCO Take 1-2 tablets by mouth every 6 (six) hours as needed for severe pain.   lisinopril 40 MG tablet Commonly known as: ZESTRIL TAKE ONE TABLET BY MOUTH ONE TIME DAILY   loratadine 10 MG tablet Commonly known as: CLARITIN Take 10 mg by mouth at bedtime as needed for allergies.   METAMUCIL FIBER PO Take 1 Dose by mouth at bedtime. 1 dose = 1 teaspoonful   methocarbamol 500 MG tablet Commonly known as: ROBAXIN Take 1 tablet (500 mg total) by mouth every 6 (six) hours as needed for muscle spasms.   metoprolol succinate 100 MG 24 hr tablet Commonly known as: TOPROL-XL Take 100 mg by mouth daily.   oxymetazoline 0.05 % nasal spray Commonly known as: AFRIN Place 1 spray into both nostrils 2 (two) times daily as needed for congestion.   polyethylene glycol 17 g packet Commonly known as: MIRALAX / GLYCOLAX Take 17 g by mouth daily as needed for mild constipation.   potassium chloride SA 20 MEQ tablet Commonly known as: KLOR-CON Take 1 tablet (20 mEq total) by mouth daily.   sodium chloride 0.65 % Soln nasal spray Commonly known as: OCEAN Place 1 spray into both nostrils as needed for congestion.   triamcinolone cream 0.1 % Commonly known as: KENALOG Apply 1 application topically daily as needed (itching).   vancomycin  IVPB Inject 1,000 mg into the vein every 12 (twelve) hours. Indication:  Prosthetic hip infection  First Dose: Yes Last Day of Therapy:  11/07/20 Labs - "Sunday/Monday:  CBC/D, BMP, and vancomycin trough. Labs - Thursday:  BMP and vancomycin trough Labs - Every other week:  ESR and CRP Method of administration:Elastomeric Method of administration may be changed at the discretion of the patient and/or caregiver's ability to self-administer the medication ordered.   Vitamin D  (Ergocalciferol) 1.25 MG (50000 UNIT) Caps capsule Commonly known as: DRISDOL TAKE ONE CAPSULE BY MOUTH ONCE WEEKLY               Discharge Care Instructions  (From admission, onward)           Start     Ordered   09/27/20 0000  Change dressing       Comments: Maintain surgical dressing until follow up in the clinic. If the edges start to pull up, may reinforce with tape. If the dressing is no longer working, may remove and cover with gauze and tape, but must keep the area dry and clean.  Call with any questions or concerns.   09/27/20 1446   09/26/20 0000  Change dressing on IV access line weekly and PRN  (Home infusion instructions - [facility] )        05" /27/22 1429            Follow-up Information     Paralee Cancel, MD. Schedule an appointment as soon as possible for a visit in 2 weeks.   Specialty: Orthopedic Surgery Contact information: [address] [place] [address] [phone]                 Signed: Griffith Citron, PA-C Orthopedic Surgery 10/12/2020, 9:07 AM

## 2020-10-13 ENCOUNTER — Encounter (HOSPITAL_COMMUNITY): Payer: Self-pay

## 2020-10-13 ENCOUNTER — Emergency Department (HOSPITAL_COMMUNITY)
Admission: EM | Admit: 2020-10-13 | Discharge: 2020-10-13 | Disposition: A | Discharge status: Home / Self Care | Payer: Medicare Other | Attending: Emergency Medicine | Admitting: Emergency Medicine

## 2020-10-13 ENCOUNTER — Other Ambulatory Visit: Payer: Self-pay

## 2020-10-13 ENCOUNTER — Emergency Department (HOSPITAL_COMMUNITY): Payer: Medicare Other

## 2020-10-13 ENCOUNTER — Telehealth: Payer: Self-pay

## 2020-10-13 DIAGNOSIS — Z79899 Other long term (current) drug therapy: Secondary | ICD-10-CM | POA: Diagnosis not present

## 2020-10-13 DIAGNOSIS — R3589 Other polyuria: Secondary | ICD-10-CM | POA: Insufficient documentation

## 2020-10-13 DIAGNOSIS — T8452XA Infection and inflammatory reaction due to internal left hip prosthesis, initial encounter: Secondary | ICD-10-CM | POA: Diagnosis not present

## 2020-10-13 DIAGNOSIS — R509 Fever, unspecified: Secondary | ICD-10-CM | POA: Diagnosis not present

## 2020-10-13 DIAGNOSIS — R21 Rash and other nonspecific skin eruption: Secondary | ICD-10-CM | POA: Insufficient documentation

## 2020-10-13 DIAGNOSIS — Z20822 Contact with and (suspected) exposure to covid-19: Secondary | ICD-10-CM | POA: Insufficient documentation

## 2020-10-13 DIAGNOSIS — I10 Essential (primary) hypertension: Secondary | ICD-10-CM | POA: Insufficient documentation

## 2020-10-13 DIAGNOSIS — Z96641 Presence of right artificial hip joint: Secondary | ICD-10-CM | POA: Diagnosis not present

## 2020-10-13 DIAGNOSIS — Z7982 Long term (current) use of aspirin: Secondary | ICD-10-CM | POA: Diagnosis not present

## 2020-10-13 DIAGNOSIS — T8451XA Infection and inflammatory reaction due to internal right hip prosthesis, initial encounter: Secondary | ICD-10-CM | POA: Diagnosis not present

## 2020-10-13 DIAGNOSIS — R0602 Shortness of breath: Secondary | ICD-10-CM | POA: Diagnosis not present

## 2020-10-13 DIAGNOSIS — R197 Diarrhea, unspecified: Secondary | ICD-10-CM | POA: Insufficient documentation

## 2020-10-13 DIAGNOSIS — R059 Cough, unspecified: Secondary | ICD-10-CM | POA: Insufficient documentation

## 2020-10-13 LAB — CBC WITH DIFFERENTIAL/PLATELET
Abs Immature Granulocytes: 0.02 10*3/uL (ref 0.00–0.07)
Basophils Absolute: 0 10*3/uL (ref 0.0–0.1)
Basophils Relative: 0 %
Eosinophils Absolute: 1.1 10*3/uL — ABNORMAL HIGH (ref 0.0–0.5)
Eosinophils Relative: 12 %
HCT: 33.5 % — ABNORMAL LOW (ref 39.0–52.0)
Hemoglobin: 11.2 g/dL — ABNORMAL LOW (ref 13.0–17.0)
Immature Granulocytes: 0 %
Lymphocytes Relative: 9 %
Lymphs Abs: 0.8 10*3/uL (ref 0.7–4.0)
MCH: 31.3 pg (ref 26.0–34.0)
MCHC: 33.4 g/dL (ref 30.0–36.0)
MCV: 93.6 fL (ref 80.0–100.0)
Monocytes Absolute: 0.7 10*3/uL (ref 0.1–1.0)
Monocytes Relative: 8 %
Neutro Abs: 6.5 10*3/uL (ref 1.7–7.7)
Neutrophils Relative %: 71 %
Platelets: 201 10*3/uL (ref 150–400)
RBC: 3.58 MIL/uL — ABNORMAL LOW (ref 4.22–5.81)
RDW: 13.9 % (ref 11.5–15.5)
WBC: 9.3 10*3/uL (ref 4.0–10.5)
nRBC: 0 % (ref 0.0–0.2)

## 2020-10-13 LAB — URINALYSIS, ROUTINE W REFLEX MICROSCOPIC
Bilirubin Urine: NEGATIVE
Glucose, UA: NEGATIVE mg/dL
Hgb urine dipstick: NEGATIVE
Ketones, ur: NEGATIVE mg/dL
Leukocytes,Ua: NEGATIVE
Nitrite: NEGATIVE
Protein, ur: NEGATIVE mg/dL
Specific Gravity, Urine: 1.005 (ref 1.005–1.030)
pH: 7 (ref 5.0–8.0)

## 2020-10-13 LAB — LACTIC ACID, PLASMA: Lactic Acid, Venous: 0.7 mmol/L (ref 0.5–1.9)

## 2020-10-13 LAB — RESP PANEL BY RT-PCR (FLU A&B, COVID) ARPGX2
Influenza A by PCR: NEGATIVE
Influenza B by PCR: NEGATIVE
SARS Coronavirus 2 by RT PCR: NEGATIVE

## 2020-10-13 LAB — COMPREHENSIVE METABOLIC PANEL
ALT: 36 U/L (ref 0–44)
AST: 26 U/L (ref 15–41)
Albumin: 3.5 g/dL (ref 3.5–5.0)
Alkaline Phosphatase: 87 U/L (ref 38–126)
Anion gap: 9 (ref 5–15)
BUN: 14 mg/dL (ref 8–23)
CO2: 28 mmol/L (ref 22–32)
Calcium: 8.9 mg/dL (ref 8.9–10.3)
Chloride: 103 mmol/L (ref 98–111)
Creatinine, Ser: 0.73 mg/dL (ref 0.61–1.24)
GFR, Estimated: >60 mL/min (ref >60)
Glucose, Bld: 97 mg/dL (ref 70–99)
Potassium: 2.9 mmol/L — ABNORMAL LOW (ref 3.5–5.1)
Sodium: 140 mmol/L (ref 135–145)
Total Bilirubin: 0.5 mg/dL (ref 0.3–1.2)
Total Protein: 6.7 g/dL (ref 6.5–8.1)

## 2020-10-13 MED ORDER — LACTATED RINGERS IV SOLN: INTRAVENOUS | Status: DC

## 2020-10-13 MED ORDER — POTASSIUM CHLORIDE CRYS ER 20 MEQ PO TBCR
40.0000 meq | EXTENDED_RELEASE_TABLET | Freq: Once | ORAL | Status: AC
  Administered 2020-10-13: 40 meq via ORAL
  Filled 2020-10-13: qty 2

## 2020-10-13 NOTE — Telephone Encounter (Signed)
Patient called stating he has been having diarrhea for 2 days, a fever of 100.7-100.8 and chills since Wednesday. Patient has been advised to go to the ED. Patient stated he will be going to [facility].  Terrill Wauters T Brooks Sailors

## 2020-10-13 NOTE — ED Provider Notes (Signed)
[facility] DEPT Provider Note   CSN: [id number] Arrival date & time: 10/13/20  1130     History Chief Complaint  Patient presents with   Fever   Diarrhea   Rash    Travis [name] is a 70 y.o. male.  70 year old male who presents with days of fever, chills along with diarrhea.  Patient has a history of right hip infection and is currently on IV vancomycin at home.  States his temperature at home was up to 100.7.  He was given Tylenol for this.  He receives his antibiotics to a right upper extremity PICC line which was placed approximately 17 days ago.  He denies any vomiting and states his diarrhea has somewhat improved.  Slight cough but no dyspnea.  States he chronically has polyuria and this is unchanged.  Rash he has is felt to be from vancomycin has been pruritic.  Called his infectious disease person, Dr. Linus Salmons, who instructed him to come here      Past Medical History:  Diagnosis Date   Arthritis    "comes and goes; mostly in hands, occasionally elbow" (12/07/5782)   Complication of anesthesia    "just wanted to sleep alot  for couple days S/P VARICOCELE OR" (04/05/2017)   DVT (deep venous thrombosis) (HCC)    LLE   Dysrhythmia    PVCs    GERD (gastroesophageal reflux disease)    History of hiatal hernia    Hypertension    Impaired glucose tolerance    Myeloma ([place])    Obesity (BMI 30-39.9) 07/26/2016   OSA on CPAP 07/26/2016   Mild with AHI 12.5/hr now on CPAP at 14cm H2O   PAF (paroxysmal atrial fibrillation) ([place])    s/p PVI Ablation in 2018 // Flecainide Rx // Echo 8/21: EF 60-65, no RWMA, mild LVH, normal RV SF, moderate RVE, mild BAE, trivial MR, mild dilation of aortic root (40 mm)   Pneumonia    "may have had walking pneumonia a few years ago" (04/05/2017)   Pre-diabetes    Thrombophlebitis     Patient Active Problem List   Diagnosis Date Noted   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/2020   PAF (paroxysmal  atrial fibrillation) (HCC)    GERD (gastroesophageal reflux disease) 03/29/2018   Hiatal hernia 03/29/2018   History of DVT (deep vein thrombosis) 03/29/2018   Hospital acquired PNA 03/29/2018   Osteoarthritis 03/29/2018   Thrombophlebitis 03/29/2018   Neuropathy 08/29/2017   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   Paroxysmal atrial fibrillation ([place]) 04/05/2017   Prolapsed internal hemorrhoids, grade 4, s/p ligation/pexy/hemorrhoidectomy x 2 02/24/2017 02/24/2017   OSA (obstructive sleep apnea) 07/26/2016   Obesity (BMI 30-39.9) 07/26/2016   Atrial fibrillation ([place])    Hypertension    Chest pain at rest     Past Surgical History:  Procedure Laterality Date   ATRIAL FIBRILLATION ABLATION  04/05/2017   ATRIAL FIBRILLATION ABLATION N/A 04/05/2017   Procedure: ATRIAL FIBRILLATION ABLATION;  Surgeon: Thompson Grayer, MD;  Location: [facility];  Service: Cardiovascular;  Laterality: N/A;   COMPLETE RIGHT HIP REPLACMENT  01/19/2019   EVALUATION UNDER ANESTHESIA WITH HEMORRHOIDECTOMY N/A 02/24/2017   Procedure: EXAM UNDER ANESTHESIA WITH  HEMORRHOIDECTOMY;  Surgeon: Michael Boston, MD;  Location: [facility];  Service: General;  Laterality: N/A;   EXCISIONAL TOTAL HIP ARTHROPLASTY WITH ANTIBIOTIC SPACERS Right 09/25/2020   Procedure: EXCISIONAL TOTAL HIP ARTHROPLASTY WITH ANTIBIOTIC SPACERS;  Surgeon: Paralee Cancel, MD;  Location: [facility];  Service: Orthopedics;  Laterality: Right;   FLEXIBLE SIGMOIDOSCOPY N/A 04/15/2015   Procedure:  UNSEDATED FLEXIBLE SIGMOIDOSCOPY;  Surgeon: [name], MD;  Location: [facility];  Service: Endoscopy;  Laterality: N/A;   KNEE ARTHROSCOPY Left ~ 2016   TESTICLE SURGERY  1988   VARICOCELE   TONSILLECTOMY     VARICOSE VEIN SURGERY Bilateral        Family History  Problem Relation Age of Onset   Dementia Mother    Stroke Father    Atrial fibrillation Father    Hypertension Father    Hypertension Paternal Grandfather    Dementia  Maternal Grandmother     Social History   Tobacco Use   Smoking status: Never   Smokeless tobacco: Never  Vaping Use   Vaping Use: Never used  Substance Use Topics   Alcohol use: Yes    Alcohol/week: 14.0 standard drinks    Types: 14 Glasses of wine per week    Comment: wine on weekend    Drug use: No    Home Medications Prior to Admission medications   Medication Sig Start Date End Date Taking? Authorizing Provider  amLODipine (NORVASC) 10 MG tablet Take 10 mg by mouth daily.     [provider]  apixaban (ELIQUIS) 5 MG TABS tablet Take 1 tablet (5 mg total) by mouth 2 (two) times daily. 03/25/20   Fay Records, MD  b complex vitamins tablet Take 1 tablet by mouth daily.    [provider]  cefTRIAXone (ROCEPHIN) IVPB Inject 2 g into the vein daily. Indication:  Prosthetic hip infection  First Dose: Yes Last Day of Therapy:  11/07/20 Labs - Once weekly:  CBC/D and BMP, Labs - Every other week:  ESR and CRP Method of administration: IV Push Method of administration may be changed at the discretion of home infusion pharmacist based upon assessment of the patient and/or caregiver's ability to self-administer the medication ordered. 09/26/20 11/07/20  Paralee Cancel, MD  cholecalciferol (VITAMIN D3) 25 MCG (1000 UNIT) tablet Take 1,000 Units by mouth daily.    [provider]  cyanocobalamin (,VITAMIN B-12,) 1000 MCG/ML injection Inject 1,000 mcg into the skin every 30 (thirty) days. 11/02/19   [provider]  diltiazem (CARDIZEM) 30 MG tablet Take 1 Tablet Every 4 Hours As Needed For Afib HR Over 100. If using frequently please call our office [phone] Patient taking differently: Take 30 mg by mouth every 4 (four) hours as needed (For Afib HR Over 100). If using frequently please call our office [phone] 02/21/19   Sherran Needs, NP  docusate sodium (COLACE) 100 MG capsule Take 1 capsule (100 mg total) by mouth 2 (two) times daily. 09/27/20    Charlyne Petrin, PA-C  flecainide (TAMBOCOR) 50 MG tablet Take 1.5 tablets (75 mg total) by mouth 2 (two) times daily. 08/04/20   Fay Records, MD  fluticasone [facility]) 50 MCG/ACT nasal spray Place 1 spray into both nostrils daily as needed for allergies or rhinitis.    [provider]  folic acid (FOLVITE) 1 MG tablet Take 1 mg by mouth daily. 11/12/19   [provider]  gabapentin (NEURONTIN) 300 MG capsule TAKE TWO CAPSULES BY MOUTH TWICE A DAY 09/16/20   Patel, Donika K, DO  Glucosamine-Chondroit-Vit C-Mn (GLUCOSAMINE 1500 COMPLEX) CAPS Take 1 capsule by mouth See admin instructions. Take 1 capsule daily for 1 week then off for 1 week then repeat    [provider]  hydrochlorothiazide (HYDRODIURIL) 25 MG tablet Take 1 tablet (25 mg total) by mouth daily. 08/18/20   Fay Records, MD  HYDROcodone-acetaminophen (NORCO) 7.5-325 MG tablet Take 1-2 tablets by mouth every 6 (six) hours as needed for severe pain. 09/27/20   Charlyne Petrin, PA-C  lisinopril (ZESTRIL) 40 MG tablet TAKE ONE TABLET BY MOUTH ONE TIME DAILY Patient taking differently: Take 40 mg by mouth daily. 08/18/20   Fay Records, MD  loratadine (CLARITIN) 10 MG tablet Take 10 mg by mouth at bedtime as needed for allergies.    [provider]  METAMUCIL FIBER PO Take 1 Dose by mouth at bedtime. 1 dose = 1 teaspoonful    [provider]  methocarbamol (ROBAXIN) 500 MG tablet Take 1 tablet (500 mg total) by mouth every 6 (six) hours as needed for muscle spasms. 09/27/20   Charlyne Petrin, PA-C  metoprolol succinate (TOPROL-XL) 100 MG 24 hr tablet Take 100 mg by mouth daily.  05/29/18   [provider]  Omega-3 Fatty Acids (FISH OIL) 1000 MG CAPS Take 1,000 mg by mouth daily.    [provider]  oxymetazoline (AFRIN) 0.05 % nasal spray Place 1 spray into both nostrils 2 (two) times daily as needed for congestion.    [provider]  polyethylene glycol (MIRALAX /  GLYCOLAX) 17 g packet Take 17 g by mouth daily as needed for mild constipation. 09/27/20   Charlyne Petrin, PA-C  potassium chloride SA (KLOR-CON) 20 MEQ tablet Take 1 tablet (20 mEq total) by mouth daily. 08/04/20   Fay Records, MD  sodium chloride (OCEAN) 0.65 % SOLN nasal spray Place 1 spray into both nostrils as needed for congestion.    [provider]  triamcinolone cream (KENALOG) 0.1 % Apply 1 application topically daily as needed (itching). 06/05/19   [provider]  vancomycin IVPB Inject 1,000 mg into the vein every 12 (twelve) hours. Indication:  Prosthetic hip infection  First Dose: Yes Last Day of Therapy:  11/07/20 Labs - Sunday/Monday:  CBC/D, BMP, and vancomycin trough. Labs - Thursday:  BMP and vancomycin trough Labs - Every other week:  ESR and CRP Method of administration:Elastomeric Method of administration may be changed at the discretion of the patient and/or caregiver's ability to self-administer the medication ordered. 09/26/20 11/07/20  Paralee Cancel, MD  Vitamin D, Ergocalciferol, (DRISDOL) 1.25 MG (50000 UT) CAPS capsule TAKE ONE CAPSULE BY MOUTH ONCE WEEKLY Patient not taking: No sig reported 01/02/19   Sherran Needs, NP    Allergies    Patient has no known allergies.  Review of Systems   Review of Systems  All other systems reviewed and are negative.  Physical Exam Updated Vital Signs BP 128/71   Pulse (!) 56   Temp 98.3 F (36.8 C) (Oral)   Resp (!) 21   Ht 1.778 m (5' 10")   Wt 114.8 kg   SpO2 95%   BMI 36.30 kg/m   Physical Exam Vitals and nursing note reviewed.  Constitutional:      General: He is not in acute distress.    Appearance: Normal appearance. He is well-developed. He is not toxic-appearing.  HENT:     Head: Normocephalic and atraumatic.  Eyes:     General: Lids are normal.     Conjunctiva/sclera: Conjunctivae normal.     Pupils: Pupils are equal, round, and reactive to light.  Neck:     Thyroid: No thyroid mass.  Trachea: No tracheal deviation.  Cardiovascular:     Rate and Rhythm: Normal rate and regular rhythm.     Heart sounds: Normal heart sounds. No murmur heard.   No gallop.  Pulmonary:     Effort: Pulmonary effort is normal. No respiratory distress.     Breath sounds: Normal breath sounds. No stridor. No decreased breath sounds, wheezing, rhonchi or rales.  Abdominal:     General: There is no distension.     Palpations: Abdomen is soft.     Tenderness: There is no abdominal tenderness. There is no rebound.  Musculoskeletal:        General: No tenderness. Normal range of motion.     Cervical back: Normal range of motion and neck supple.  Skin:    General: Skin is warm and dry.     Findings: Rash present. No abrasion. Rash is macular.  Neurological:     Mental Status: He is alert and oriented to person, place, and time. Mental status is at baseline.     GCS: GCS eye subscore is 4. GCS verbal subscore is 5. GCS motor subscore is 6.     Cranial Nerves: Cranial nerves are intact. No cranial nerve deficit.     Sensory: No sensory deficit.     Motor: Motor function is intact.  Psychiatric:        Attention and Perception: Attention normal.        Speech: Speech normal.        Behavior: Behavior normal.    ED Results / Procedures / Treatments   Labs (all labs ordered are listed, but only abnormal results are displayed) Labs Reviewed  CULTURE, BLOOD (ROUTINE X 2)  CULTURE, BLOOD (ROUTINE X 2)  URINE CULTURE  RESP PANEL BY RT-PCR (FLU A&B, COVID) ARPGX2  LACTIC ACID, PLASMA  CBC WITH DIFFERENTIAL/PLATELET  COMPREHENSIVE METABOLIC PANEL  URINALYSIS, ROUTINE W REFLEX MICROSCOPIC    EKG None  Radiology No results found.  Procedures Procedures   Medications Ordered in ED Medications  lactated ringers infusion (has no administration in time range)    ED Course  I have reviewed the triage vital signs and the nursing notes.  Pertinent labs & imaging results that were  available during my care of the patient were reviewed by me and considered in my medical decision making (see chart for details).    MDM Rules/Calculators/A&P                          Labs and x-rays reviewed with Dr. Linus Salmons, on-call for infectious disease.  Patient might have early pneumonia on chest x-ray.  He has had a mild cough does not been short of breath.  Lactate is normal.  He does not have a leukocytosis.  Blood cultures have been obtained.  Per Dr. Linus Salmons, he recommends no change to patient's current regimen and he will follow-up. Final Clinical Impression(s) / ED Diagnoses Final diagnoses:  None    Rx / DC Orders ED Discharge Orders     None        Lacretia Leigh, MD 10/13/20 1521

## 2020-10-13 NOTE — Telephone Encounter (Signed)
Notified Mary with [facility] with verbal orders per Dr. Linus Salmons continue labs, discontinue vanc through. Same end date and pull picc at completion of therapy.   [facility] also made aware that patient currently in the hospital. [facility] team will coordinate with [facility] nursing first dosing of Daptomycin. Eugenia Mcalpine

## 2020-10-13 NOTE — ED Notes (Signed)
X RAY at bedside 

## 2020-10-13 NOTE — Discharge Instructions (Addendum)
Follow-up with Dr. Linus Salmons from infectious disease

## 2020-10-13 NOTE — Telephone Encounter (Signed)
Kristen with short stay returned phone call to follow up in regards to patient needing first dose with short stay. I have advised Cyril Mourning that patient will not need first dose with short stay. Kristen verbalized understanding. Ramil Edgington T Brooks Sailors

## 2020-10-13 NOTE — ED Triage Notes (Addendum)
Patient c/o fever, chills, rash,and diarrhea x 5 days.  Patient states he had a right hip revision in May and is currently taken Vancomycin IV and another antibiotic that he doesn't know the name of.  Patient added that his T. Was 100.7 when the Home health nurse was at his home today and he took Tylenol ES 2 tabs at 1015 today.

## 2020-10-13 NOTE — Telephone Encounter (Signed)
Dr. Linus Salmons, patient will be able have first dosing at home with [facility] home care. Would you like to continue same end date and lab orders (discontinue vancomycin though) and pull pic at the end of therapy?  Medication changes: d/c vancomycin, Continue Rocephin,  start Daptomycin  End Date: 11/07/20    Labs weekly while on IV antibiotics: _x_ CBC with differential __ BMP _x_ CMP _x_ CRP _x_ ESR __ Vancomycin trough (discontinue) __ CK   _x_ Please pull PIC at completion of IV antibiotics

## 2020-10-14 ENCOUNTER — Telehealth: Payer: Self-pay

## 2020-10-14 DIAGNOSIS — T8451XA Infection and inflammatory reaction due to internal right hip prosthesis, initial encounter: Secondary | ICD-10-CM | POA: Diagnosis not present

## 2020-10-14 LAB — URINE CULTURE: Culture: NO GROWTH

## 2020-10-14 NOTE — Telephone Encounter (Signed)
Patient called back today and reports still having fevers. Patient states today his temp was 102.3, chills and diarrhea has subsided. Pt went to the ED yesterday for this and was discharged. Please advise

## 2020-10-15 DIAGNOSIS — T8451XA Infection and inflammatory reaction due to internal right hip prosthesis, initial encounter: Secondary | ICD-10-CM | POA: Diagnosis not present

## 2020-10-15 NOTE — Telephone Encounter (Signed)
Patient called back this afternoon complaining of sweating excessive. Patient is scheduled for an appointment with our office tomorrow.

## 2020-10-15 NOTE — Telephone Encounter (Signed)
Dr. Linus Salmons it appears that Travis [name] has been unsuccessful getting the patient scheduled for a sooner appointment due to appointment availability. I spoke to patient this morning and he stated he did not have fever or chills last night and no feer as of this morning. [facility] calling this morning to see if it okay for patient to go ahead and start Daptomycin today.  .Rayner Erman Tilda Burrow

## 2020-10-16 ENCOUNTER — Other Ambulatory Visit: Payer: Self-pay

## 2020-10-16 ENCOUNTER — Telehealth: Payer: Self-pay

## 2020-10-16 ENCOUNTER — Ambulatory Visit: Payer: Medicare Other | Admitting: Infectious Diseases

## 2020-10-16 ENCOUNTER — Encounter: Payer: Self-pay | Admitting: Infectious Diseases

## 2020-10-16 ENCOUNTER — Telehealth: Payer: Self-pay | Admitting: Internal Medicine

## 2020-10-16 ENCOUNTER — Ambulatory Visit
Admission: RE | Admit: 2020-10-16 | Discharge: 2020-10-16 | Disposition: A | Discharge status: Home / Self Care | Payer: Medicare Other | Source: Ambulatory Visit | Attending: Infectious Diseases | Admitting: Infectious Diseases

## 2020-10-16 VITALS — BP 145/69 | HR 67 | Temp 100.5°F | Wt 262.4 lb

## 2020-10-16 DIAGNOSIS — I509 Heart failure, unspecified: Secondary | ICD-10-CM | POA: Diagnosis not present

## 2020-10-16 DIAGNOSIS — T8451XA Infection and inflammatory reaction due to internal right hip prosthesis, initial encounter: Secondary | ICD-10-CM | POA: Diagnosis not present

## 2020-10-16 DIAGNOSIS — T8451XD Infection and inflammatory reaction due to internal right hip prosthesis, subsequent encounter: Secondary | ICD-10-CM

## 2020-10-16 DIAGNOSIS — J90 Pleural effusion, not elsewhere classified: Secondary | ICD-10-CM | POA: Diagnosis not present

## 2020-10-16 DIAGNOSIS — R0602 Shortness of breath: Secondary | ICD-10-CM | POA: Diagnosis not present

## 2020-10-16 NOTE — Progress Notes (Signed)
   Subjective:    Patient ID: Travis [name], male    DOB: 26-Jul-1950, 70 y.o.   MRN: [id number]  HPI 70 yo M with hx of myeloma (dx 2019, stable per pt no treatment), of R THR 01-2019. He did well post-op but by 02-2020 had increasing pain.  He was then adm on 09-25-20 and underwent resection of his THR and placement of anbx spacer.  Purulent material was noted in OR but his Cx were (-). His CRP was 6.1 and his ESR was 52. He was d/c on vanco/ceftriaxone with planned end date 11-07-20.  He returned to hospital on 6-13 with 1 episode of loose BM and temp to 100.7. He was felt to have possible PNA but his anbx were unchanged. His BCx are ngtd. His WBC was normal.  CXR-  Streaky right lower lobe airspace opacity suspicious for pneumonia. Radiographic follow-up to resolution is recommended. He had his vanco changed to dapto on 6-14. Got first dose of dapto this AM.  Today he continues to feel poorly (fever, hard to talk, cough, worsening LE edema)- temp 100.5. Called [facility] who will call back this PM.  Developed rash with vanco which felt horrible and persisted for 24h post each dose.    Review of Systems  Constitutional:  Positive for chills, fatigue, fever and unexpected weight change.  Respiratory:  Positive for cough and shortness of breath.   Cardiovascular:  Positive for leg swelling.  The past medical history, family history and social history were reviewed/updated in EPIC     Objective:   Physical Exam Vitals and nursing note reviewed.  Constitutional:      Appearance: Normal appearance. He is obese. He is not ill-appearing or diaphoretic.  HENT:     Mouth/Throat:     Mouth: Mucous membranes are moist.     Pharynx: No oropharyngeal exudate.  Eyes:     Extraocular Movements: Extraocular movements intact.     Pupils: Pupils are equal, round, and reactive to light.  Cardiovascular:     Rate and Rhythm: Normal rate and regular rhythm.  Pulmonary:     Effort: Pulmonary effort is  normal.     Breath sounds: Rhonchi present.  Abdominal:     General: Bowel sounds are normal. There is no distension.     Palpations: Abdomen is soft.     Tenderness: There is no abdominal tenderness.  Musculoskeletal:     Cervical back: Normal range of motion and neck supple.     Right lower leg: Edema present.     Left lower leg: Edema present.  Psychiatric:        Mood and Affect: Mood normal.          Assessment & Plan:

## 2020-10-16 NOTE — Telephone Encounter (Signed)
Replied to patient in MyChart since he has a scheduled ID appointment at this time.  Sent message to [facility] nurse to see where pt and his wife can be added in.

## 2020-10-16 NOTE — Telephone Encounter (Signed)
Pt c/o swelling: STAT is pt has developed SOB within 24 hours  If swelling, where is the swelling located? Ankles and feet  How much weight have you gained and in what time span? 4-5 lbs since swelling started about a week  Have you gained 3 pounds in a day or 5 pounds in a week? 5 pounds in a week  Do you have a log of your daily weights (if so, list)? no  Are you currently taking a fluid pill? Hctz   Are you currently SOB? no  Have you traveled recently? no   Patient states he has been having swelling  in his feet and ankles. He states he recently had surgery and is on antibiotics for an infection related to the surgery. He states he also has pneumonia, but no other symptoms. Please advise.

## 2020-10-16 NOTE — Assessment & Plan Note (Signed)
Pt is not doing well.  He has no problems with his pic.  He is not dyspneic in clinic and does not cough. My suspicion for pneumonia is low.  I do suspect that he is fluid overloaded from his anbx and flushes.  I also suspect that he has had an ADR- his rash from vanco and perhaps his fluid overload. As well cephalosporins could cause this.  We discussed a plan- Hold ceftriaxone til f/u Obtain CXR He is awaiting call from his CV doctor about diuresis.  rtc 6-20/21 If he has any worsening (fever, sob) to call us immediately.  We also discussed that he could be eval in ED today for diuresis, admission. They defer this.

## 2020-10-16 NOTE — Telephone Encounter (Signed)
Per Dr. Johnnye Sima, discontinue ceftriaxone, continue daptomycin thru 7/8; verbal orders given to [facility].  Patient is scheduled to see Cardiology on Monday. Patient to return to [facility] on Monday/Tuesday to check in and ensure he's feeling better. Family instructed to call with any questions/concerns.   Linzy Laury Lorita Officer, RN

## 2020-10-16 NOTE — Telephone Encounter (Signed)
The pt is scheduled 10/20/20 at [place] office.

## 2020-10-17 ENCOUNTER — Telehealth: Payer: Self-pay | Admitting: Pharmacist

## 2020-10-17 DIAGNOSIS — T8451XD Infection and inflammatory reaction due to internal right hip prosthesis, subsequent encounter: Secondary | ICD-10-CM

## 2020-10-17 LAB — COMPREHENSIVE METABOLIC PANEL
AG Ratio: 1.6 (calc) (ref 1.0–2.5)
ALT: 49 U/L — ABNORMAL HIGH (ref 9–46)
AST: 41 U/L — ABNORMAL HIGH (ref 10–35)
Albumin: 3.3 g/dL — ABNORMAL LOW (ref 3.6–5.1)
Alkaline phosphatase (APISO): 106 U/L (ref 35–144)
BUN: 20 mg/dL (ref 7–25)
CO2: 24 mmol/L (ref 20–32)
Calcium: 8.4 mg/dL — ABNORMAL LOW (ref 8.6–10.3)
Chloride: 100 mmol/L (ref 98–110)
Creat: 0.81 mg/dL (ref 0.70–1.25)
Globulin: 2.1 g/dL (calc) (ref 1.9–3.7)
Glucose, Bld: 121 mg/dL — ABNORMAL HIGH (ref 65–99)
Potassium: 3.4 mmol/L — ABNORMAL LOW (ref 3.5–5.3)
Sodium: 135 mmol/L (ref 135–146)
Total Bilirubin: 0.6 mg/dL (ref 0.2–1.2)
Total Protein: 5.4 g/dL — ABNORMAL LOW (ref 6.1–8.1)

## 2020-10-17 LAB — CBC WITH DIFFERENTIAL/PLATELET
Absolute Monocytes: 832 cells/uL (ref 200–950)
Basophils Absolute: 40 cells/uL (ref 0–200)
Basophils Relative: 0.4 %
Eosinophils Absolute: 1218 cells/uL — ABNORMAL HIGH (ref 15–500)
Eosinophils Relative: 12.3 %
HCT: 30.1 % — ABNORMAL LOW (ref 38.5–50.0)
Hemoglobin: 10.1 g/dL — ABNORMAL LOW (ref 13.2–17.1)
Lymphs Abs: 594 cells/uL — ABNORMAL LOW (ref 850–3900)
MCH: 30.5 pg (ref 27.0–33.0)
MCHC: 33.6 g/dL (ref 32.0–36.0)
MCV: 90.9 fL (ref 80.0–100.0)
MPV: 11.1 fL (ref 7.5–12.5)
Monocytes Relative: 8.4 %
Neutro Abs: 7217 cells/uL (ref 1500–7800)
Neutrophils Relative %: 72.9 %
Platelets: 248 10*3/uL (ref 140–400)
RBC: 3.31 10*6/uL — ABNORMAL LOW (ref 4.20–5.80)
RDW: 13.3 % (ref 11.0–15.0)
Total Lymphocyte: 6 %
WBC: 9.9 10*3/uL (ref 3.8–10.8)

## 2020-10-17 LAB — BRAIN NATRIURETIC PEPTIDE: Brain Natriuretic Peptide: 427 pg/mL — ABNORMAL HIGH (ref <100)

## 2020-10-17 MED ORDER — LINEZOLID 600 MG PO TABS
600.0000 mg | ORAL_TABLET | Freq: Two times a day (BID) | ORAL | 0 refills | Status: DC
Start: 2020-10-17 — End: 2020-12-27

## 2020-10-17 NOTE — Telephone Encounter (Signed)
Discussed plans for antibiotic changes with Dr. Linus Salmons. Patient's eosinophils are elevated at 1,218. Patient also having fevers and SOB. 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 Patient will be switched to oral linezolid and restart ceftriaxone IV (stopped for concern his reaction was d/t beta lactam).  Called patient and advised him of plan. Will send in linezolid 600 mg PO BID to [facility] pharmacy. I sent in a 30 day supply but patient's end date is 7/8 per note from 10/06/20. Also advised him that we will be stopping the daptomycin and restarting ceftriaxone. Patient states he is feeling better today.  Called and spoke to Amy, Pharmacist at [facility]. Gave verbal order to stop daptomycin and restart ceftriaxone. She will send vials out to him today. Patient has follow up on Tuesday here with Dr. Baxter Flattery.

## 2020-10-17 NOTE — Telephone Encounter (Signed)
Upon further investigation, it appears that patient has only taken one dose of daptomycin yesterday so eosinophilic pneumonia highly unlikely. He was still administering the vancomycin up until Wednesday the 15th. 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 He states the rash is getting better since stopping the vanc 2 days ago.   Will continue with original plan of doing linezolid oral + ceftriaxone IV. Dr. Linus Salmons aware.

## 2020-10-18 DIAGNOSIS — T8451XA Infection and inflammatory reaction due to internal right hip prosthesis, initial encounter: Secondary | ICD-10-CM | POA: Diagnosis not present

## 2020-10-18 LAB — CULTURE, BLOOD (ROUTINE X 2)
Culture: NO GROWTH
Culture: NO GROWTH
Special Requests: ADEQUATE
Special Requests: ADEQUATE

## 2020-10-19 DIAGNOSIS — T8451XA Infection and inflammatory reaction due to internal right hip prosthesis, initial encounter: Secondary | ICD-10-CM | POA: Diagnosis not present

## 2020-10-20 ENCOUNTER — Encounter (HOSPITAL_BASED_OUTPATIENT_CLINIC_OR_DEPARTMENT_OTHER): Payer: Medicare Other | Admitting: Dietician

## 2020-10-20 ENCOUNTER — Other Ambulatory Visit (HOSPITAL_COMMUNITY)
Admission: RE | Admit: 2020-10-20 | Discharge: 2020-10-20 | Disposition: A | Discharge status: Home / Self Care | Payer: Medicare Other | Source: Ambulatory Visit | Attending: Internal Medicine | Admitting: Internal Medicine

## 2020-10-20 ENCOUNTER — Encounter: Payer: Self-pay | Admitting: Internal Medicine

## 2020-10-20 ENCOUNTER — Other Ambulatory Visit: Payer: Self-pay

## 2020-10-20 ENCOUNTER — Ambulatory Visit: Payer: Medicare Other | Admitting: Internal Medicine

## 2020-10-20 VITALS — BP 144/80 | HR 60 | Ht 70.0 in | Wt 260.0 lb

## 2020-10-20 DIAGNOSIS — I480 Paroxysmal atrial fibrillation: Secondary | ICD-10-CM | POA: Diagnosis not present

## 2020-10-20 DIAGNOSIS — Z79899 Other long term (current) drug therapy: Secondary | ICD-10-CM

## 2020-10-20 DIAGNOSIS — R7303 Prediabetes: Secondary | ICD-10-CM | POA: Diagnosis not present

## 2020-10-20 LAB — CBC WITH DIFFERENTIAL/PLATELET
Abs Immature Granulocytes: 0.07 10*3/uL (ref 0.00–0.07)
Basophils Absolute: 0.1 10*3/uL (ref 0.0–0.1)
Basophils Relative: 1 %
Eosinophils Absolute: 1.7 10*3/uL — ABNORMAL HIGH (ref 0.0–0.5)
Eosinophils Relative: 16 %
HCT: 35.3 % — ABNORMAL LOW (ref 39.0–52.0)
Hemoglobin: 11.4 g/dL — ABNORMAL LOW (ref 13.0–17.0)
Immature Granulocytes: 1 %
Lymphocytes Relative: 7 %
Lymphs Abs: 0.8 10*3/uL (ref 0.7–4.0)
MCH: 30.6 pg (ref 26.0–34.0)
MCHC: 32.3 g/dL (ref 30.0–36.0)
MCV: 94.9 fL (ref 80.0–100.0)
Monocytes Absolute: 0.7 10*3/uL (ref 0.1–1.0)
Monocytes Relative: 6 %
Neutro Abs: 7 10*3/uL (ref 1.7–7.7)
Neutrophils Relative %: 69 %
Platelets: 386 10*3/uL (ref 150–400)
RBC: 3.72 MIL/uL — ABNORMAL LOW (ref 4.22–5.81)
RDW: 14.1 % (ref 11.5–15.5)
WBC: 10.2 10*3/uL (ref 4.0–10.5)
nRBC: 0.2 % (ref 0.0–0.2)

## 2020-10-20 LAB — SEDIMENTATION RATE: Sed Rate: 86 mm/hr — ABNORMAL HIGH (ref 0–16)

## 2020-10-20 MED ORDER — FUROSEMIDE 40 MG PO TABS: 40.0000 mg | ORAL_TABLET | ORAL | 1 refills | Status: DC

## 2020-10-20 MED ORDER — POTASSIUM CHLORIDE CRYS ER 20 MEQ PO TBCR: 20.0000 meq | EXTENDED_RELEASE_TABLET | Freq: Every day | ORAL | 3 refills | Status: DC

## 2020-10-20 NOTE — Progress Notes (Signed)
a   Cardiology Office Note   Date:  10/20/2020   ID:  Khizar, Fiorella [birth year], MRN [id number]  PCP:  Lajean Manes, MD  Cardiologist:   [name], MD    Patient presents for eval of LE edema   History of Present Illness: Travis [name] is a 70 y.o. male with a history of  HTN, OSA and PAF (CHADSVASc score 4; s/p Afib ablation 2018  On flecanide).   The pt had Cardiac CTA in 2018 which showed no CAD  Calcium score was 0   Pt also has hx of DVT, peripheral neuropathy and MGUS   I saw the pt in March 2022   He was doing good from a cardiac standpoint   Being eval for surgery  On 5/26 admitted for resection of THR and placement of ABX spacer.  He was sent home on long term ABX Post procedure had loose BM and fever    CXR (portable ) on 6/13 showed streaky density RLL, possible pneumonia. Seen in ID clinic on 6/16   PA/Lat CXR showed cardiomegatly   Small bilateral effusions, intersitial thickening    Edema noted in legs   ABX changed on 6/16 I ordered labs on 6/16  BNP 427  HGB 10.1   Increased eos(1218)  The pt says he hasnt' felt good    Rash develop across body (trunk, limbs)   Itchy   Says it may be getting better    Feels warm Still with edema    No signif SOB    No CP   No palpitations     Pt has appt in ID clinic with Dr Evelina Bucy tomorrow   Current Meds  Medication Sig   amLODipine (NORVASC) 10 MG tablet Take 10 mg by mouth daily.    apixaban (ELIQUIS) 5 MG TABS tablet Take 1 tablet (5 mg total) by mouth 2 (two) times daily.   b complex vitamins tablet Take 1 tablet by mouth daily.   cholecalciferol (VITAMIN D3) 25 MCG (1000 UNIT) tablet Take 1,000 Units by mouth daily.   diltiazem (CARDIZEM) 30 MG tablet Take 1 Tablet Every 4 Hours As Needed For Afib HR Over 100. If using frequently please call our office [phone] (Patient taking differently: Take 30 mg by mouth every 4 (four) hours as needed (For Afib HR Over 100). If using frequently please call our office  [phone])   flecainide (TAMBOCOR) 50 MG tablet Take 1.5 tablets (75 mg total) by mouth 2 (two) times daily.   fluticasone (FLONASE) 50 MCG/ACT nasal spray Place 1 spray into both nostrils daily as needed for allergies or rhinitis.   folic acid (FOLVITE) 1 MG tablet Take 1 mg by mouth daily.   furosemide (LASIX) 40 MG tablet Take 1 tablet (40 mg total) by mouth every other day.   gabapentin (NEURONTIN) 300 MG capsule TAKE TWO CAPSULES BY MOUTH TWICE A DAY   hydrochlorothiazide (HYDRODIURIL) 25 MG tablet Take 1 tablet (25 mg total) by mouth daily.   linezolid (ZYVOX) 600 MG tablet Take 1 tablet (600 mg total) by mouth 2 (two) times daily.   linezolid (ZYVOX) 600 MG tablet Take 600 mg by mouth 2 (two) times daily.   lisinopril (ZESTRIL) 40 MG tablet TAKE ONE TABLET BY MOUTH ONE TIME DAILY (Patient taking differently: Take 40 mg by mouth daily.)   METAMUCIL FIBER PO Take 1 Dose by mouth at bedtime. 1 dose = 1 teaspoonful   metoprolol succinate (TOPROL-XL) 100 MG 24  hr tablet Take 100 mg by mouth daily.    oxymetazoline (AFRIN) 0.05 % nasal spray Place 1 spray into both nostrils 2 (two) times daily as needed for congestion.   polyethylene glycol (MIRALAX / GLYCOLAX) 17 g packet Take 17 g by mouth daily as needed for mild constipation.   potassium chloride SA (KLOR-CON) 20 MEQ tablet Take 1 tablet (20 mEq total) by mouth daily. Take 40 mg on the Days that you take Lasix   sodium chloride (OCEAN) 0.65 % SOLN nasal spray Place 1 spray into both nostrils as needed for congestion.   triamcinolone cream (KENALOG) 0.1 % Apply 1 application topically daily as needed (itching).   [DISCONTINUED] potassium chloride SA (KLOR-CON) 20 MEQ tablet Take 1 tablet (20 mEq total) by mouth daily.     Allergies:   Patient has no known allergies.   Past Medical History:  Diagnosis Date   Arthritis    "comes and goes; mostly in hands, occasionally elbow" (92/08/4626)   Complication of anesthesia    "just wanted to  sleep alot  for couple days S/P VARICOCELE OR" (04/05/2017)   DVT (deep venous thrombosis) (HCC)    LLE   Dysrhythmia    PVCs    GERD (gastroesophageal reflux disease)    History of hiatal hernia    Hypertension    Impaired glucose tolerance    Myeloma ([place])    Obesity (BMI 30-39.9) 07/26/2016   OSA on CPAP 07/26/2016   Mild with AHI 12.5/hr now on CPAP at 14cm H2O   PAF (paroxysmal atrial fibrillation) ([place])    s/p PVI Ablation in 2018 // Flecainide Rx // Echo 8/21: EF 60-65, no RWMA, mild LVH, normal RV SF, moderate RVE, mild BAE, trivial MR, mild dilation of aortic root (40 mm)   Pneumonia    "may have had walking pneumonia a few years ago" (04/05/2017)   Pre-diabetes    Thrombophlebitis     Past Surgical History:  Procedure Laterality Date   ATRIAL FIBRILLATION ABLATION  04/05/2017   ATRIAL FIBRILLATION ABLATION N/A 04/05/2017   Procedure: ATRIAL FIBRILLATION ABLATION;  Surgeon: Thompson Grayer, MD;  Location: [facility];  Service: Cardiovascular;  Laterality: N/A;   COMPLETE RIGHT HIP REPLACMENT  01/19/2019   EVALUATION UNDER ANESTHESIA WITH HEMORRHOIDECTOMY N/A 02/24/2017   Procedure: EXAM UNDER ANESTHESIA WITH  HEMORRHOIDECTOMY;  Surgeon: Michael Boston, MD;  Location: [facility];  Service: General;  Laterality: N/A;   EXCISIONAL TOTAL HIP ARTHROPLASTY WITH ANTIBIOTIC SPACERS Right 09/25/2020   Procedure: EXCISIONAL TOTAL HIP ARTHROPLASTY WITH ANTIBIOTIC SPACERS;  Surgeon: Paralee Cancel, MD;  Location: [facility];  Service: Orthopedics;  Laterality: Right;   FLEXIBLE SIGMOIDOSCOPY N/A 04/15/2015   Procedure:  UNSEDATED FLEXIBLE SIGMOIDOSCOPY;  Surgeon: [name], MD;  Location: [facility];  Service: Endoscopy;  Laterality: N/A;   KNEE ARTHROSCOPY Left ~ 2016   TESTICLE SURGERY  1988   VARICOCELE   TONSILLECTOMY     VARICOSE VEIN SURGERY Bilateral      Social History:  The patient  reports that he has never smoked. He has never used smokeless tobacco. He reports  current alcohol use of about 14.0 standard drinks of alcohol per week. He reports that he does not use drugs.   Family History:  The patient's family history includes Atrial fibrillation in his father; Dementia in his maternal grandmother and mother; Hypertension in his father and paternal grandfather; Stroke in his father.    ROS:  Please see the history of present  illness. All other systems are reviewed and  Negative to the above problem except as noted.    PHYSICAL EXAM: VS:  BP (!) 144/80   Pulse 60   Ht '5\' 10"'  (1.778 m)   Wt 260 lb (117.9 kg)   SpO2 94%   BMI 37.31 kg/m   GEN: Morbidly obese 70 yo in no acute distress  HEENT: normal  Neck: no JVD, Cardiac: RRR; no murmurs   1+  edema  Respiratory:  clear to auscultation bilaterally,  No rales  GI: soft, nontender, nondistended, + BS   MS: no deformity Moving all extremities   Skin: warm and dry   Diffuse maculopapular rash   Neuro:  Deferred  EKG:  EKG is not ordered     Lipid Panel No results found for: CHOL, TRIG, HDL, CHOLHDL, VLDL, LDLCALC, LDLDIRECT    Wt Readings from Last 3 Encounters:  10/20/20 260 lb (117.9 kg)  10/16/20 262 lb 6.4 oz (119 kg)  10/13/20 253 lb (114.8 kg)      ASSESSMENT AND PLAN:  1   Edema   Volume is increased some  May be related to IV  ABX / saline   May be related to diffuse inflamm (with eos) I have recomm that he take lasix 40 mg with 20 MEq additional KC L every other day   Keep on HCTZ Watch Na  intake Review rash with ID tomorrow   WIll check ESR  2   ID  Pt being treated for infectoin   Has diffuse rash with eosinophila   REcomm repeat CBC/diff.   Review meds with ID       3 Hx of PAF  Clinically in SR   Keep on flecanide and apixaban  4  HTN  Controlled on current regimen   5  OSA  Followed by Ashok Norris Using CPAP     4   Morbid obesity  Follow for now      F/U next fall     Current medicines are reviewed at length with the patient today.  The patient does not have  concerns regarding medicines.  Signed, [name], MD  10/20/2020 10:09 PM    [address], [place], [place]  [address] Phone: [phone]; Fax: [phone]

## 2020-10-20 NOTE — Patient Instructions (Signed)
Medication Instructions:  Your physician has recommended you make the following change in your medication:   Take Lasix 40 mg Every Other Day  Take Potassium 20 mg Daily and Take 40 mg on the days that you take Lasix   *If you need a refill on your cardiac medications before your next appointment, please call your pharmacy*   Lab Work: Your physician recommends that you return for lab work in: Today   If you have labs (blood work) drawn today and your tests are completely normal, you will receive your results only by: [place] (if you have MyChart) OR A paper copy in the mail If you have any lab test that is abnormal or we need to change your treatment, we will call you to review the results.   Testing/Procedures: NONE    Follow-Up: At [facility], you and your health needs are our priority.  As part of our continuing mission to provide you with exceptional heart care, we have created designated Provider Care Teams.  These Care Teams include your primary Cardiologist (physician) and Advanced Practice Providers (APPs -  Physician Assistants and Nurse Practitioners) who all work together to provide you with the care you need, when you need it.  We recommend signing up for the patient portal called "MyChart".  Sign up information is provided on this After Visit Summary.  MyChart is used to connect with patients for Virtual Visits (Telemedicine).  Patients are able to view lab/test results, encounter notes, upcoming appointments, etc.  Non-urgent messages can be sent to your provider as well.   To learn more about what you can do with MyChart, go to [email or web address].    Your next appointment:    Pending   The format for your next appointment:   In Person  Provider:   [name], MD   Other Instructions Thank you for choosing [facility]!

## 2020-10-20 NOTE — Progress Notes (Signed)
On 10/20/2020 patient completed a post core session of the Diabetes Prevention Program course virtually with [employer] [facility] [employer] [facility]. By the end of this session patients are able to complete the following objectives:   I connected with Satira Anis. Ibe, 05-Jun-1950 by a video enabled application and verified that I am speaking with the correct person using two identifiers.  Location: Patient: Virtual Provider: Office  Learning Objectives: Describe how to incorporate more fruits and vegetables into meals. List criteria for selecting good quality fruits and vegetables at the store.  Define mindful eating. List the benefits of eating mindfully.   Goals:  Record weight taken outside of class.  Track foods and beverages eaten each day in the "Food and Activity Tracker," including calories and fat grams for each item.   Track activity type, minutes you were active, and distance you reached each day in the "Food and Activity Tracker."   Follow-Up Plan: Attend next session.  Email completed "Food and Activity Trackers" before next session to be reviewed by Lifestyle [facility].

## 2020-10-21 ENCOUNTER — Encounter: Payer: Self-pay | Admitting: Internal Medicine

## 2020-10-21 ENCOUNTER — Other Ambulatory Visit: Payer: Self-pay

## 2020-10-21 ENCOUNTER — Ambulatory Visit: Payer: Medicare Other | Admitting: Internal Medicine

## 2020-10-21 VITALS — BP 147/76 | HR 59 | Temp 98.1°F | Resp 16 | Ht 70.0 in | Wt 258.0 lb

## 2020-10-21 DIAGNOSIS — T50905A Adverse effect of unspecified drugs, medicaments and biological substances, initial encounter: Secondary | ICD-10-CM

## 2020-10-21 DIAGNOSIS — T8452XA Infection and inflammatory reaction due to internal left hip prosthesis, initial encounter: Secondary | ICD-10-CM | POA: Diagnosis not present

## 2020-10-21 DIAGNOSIS — T8451XA Infection and inflammatory reaction due to internal right hip prosthesis, initial encounter: Secondary | ICD-10-CM | POA: Diagnosis not present

## 2020-10-21 DIAGNOSIS — T8451XD Infection and inflammatory reaction due to internal right hip prosthesis, subsequent encounter: Secondary | ICD-10-CM

## 2020-10-21 DIAGNOSIS — R197 Diarrhea, unspecified: Secondary | ICD-10-CM

## 2020-10-21 DIAGNOSIS — I509 Heart failure, unspecified: Secondary | ICD-10-CM | POA: Diagnosis not present

## 2020-10-21 DIAGNOSIS — D721 Eosinophilia, unspecified: Secondary | ICD-10-CM

## 2020-10-21 NOTE — Progress Notes (Signed)
Patient ID: Travis [name], male   DOB: January 24, 1951, 70 y.o.   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 His course was complicated with fever, rash, diarrhea on 6/13 when he presented to the ED. Work up revealed pneumonia. He was changed from ceftriaxone plus daptomycin suspected he had vancomycin drug rash (with eosinophilia) He was seen on follow up by dr hatcher on 6/16 who felt less likely pneumonia but infiltrates due to fluid overload (marked edema). He still was concern about drug rash thus, ceftriaxone was temporarily held. Based upon looking at labs, it was unclear if it was vancomycin vs. Dapto causing eosinophilia, dr comer who saw him initially, changed patient back to ceftriaxone plus oral linezolid (for MRSA coverage) on 6/17. He states that he is still noticing Diarrhea since Sunday, ( 3 BM - watery). His rash is improved but still faintly noticeable on legs and abdomen. He did see his cardiologist, dr Harrington Challenger, yesterday who is also concerned about his fluid status, and marked lower extremity edema. She has started  him on  lasix QOD.   No abdominal cramping, or blood in stool. He is not having any difficulty with his picc line.   OPAT Orders  Duration: 6 weeks End Date: 11/07/20  Outpatient Encounter Medications as of 10/21/2020  Medication Sig   amLODipine (NORVASC) 10 MG tablet Take 10 mg by mouth daily.    apixaban (ELIQUIS) 5 MG TABS tablet Take 1 tablet (5 mg total) by mouth 2 (two) times daily.   b complex vitamins tablet Take 1 tablet by mouth daily.   cefTRIAXone (ROCEPHIN) 2 g injection See admin instructions.   cholecalciferol (VITAMIN D3) 25 MCG (1000 UNIT) tablet Take 1,000 Units by mouth daily.   diltiazem (CARDIZEM) 30 MG tablet Take 1 Tablet Every 4 Hours As Needed For Afib HR Over  100. If using frequently please call our office [phone] (Patient taking differently: Take 30 mg by mouth every 4 (four) hours as needed (For Afib HR Over 100). If using frequently please call our office [phone])   flecainide (TAMBOCOR) 50 MG tablet Take 1.5 tablets (75 mg total) by mouth 2 (two) times daily.   fluticasone (FLONASE) 50 MCG/ACT nasal spray Place 1 spray into both nostrils daily as needed for allergies or rhinitis.   folic acid (FOLVITE) 1 MG tablet Take 1 mg by mouth daily.   furosemide (LASIX) 40 MG tablet Take 1 tablet (40 mg total) by mouth every other day.   gabapentin (NEURONTIN) 300 MG capsule TAKE TWO CAPSULES BY MOUTH TWICE A DAY   hydrochlorothiazide (HYDRODIURIL) 25 MG tablet Take 1 tablet (25 mg total) by mouth daily.   linezolid (ZYVOX) 600 MG tablet Take 1 tablet (600 mg total) by mouth 2 (two) times daily.   lisinopril (ZESTRIL) 40 MG tablet TAKE ONE TABLET BY MOUTH ONE TIME DAILY (Patient taking differently: Take 40 mg by mouth daily.)   METAMUCIL FIBER PO Take 1 Dose by mouth at bedtime. 1 dose = 1 teaspoonful   metoprolol succinate (TOPROL-XL) 100 MG 24 hr tablet Take 100 mg by mouth daily.    oxymetazoline (AFRIN) 0.05 % nasal spray Place 1 spray into both nostrils 2 (two) times daily as needed for congestion.   polyethylene glycol (MIRALAX / GLYCOLAX) 17 g packet Take 17 g  by mouth daily as needed for mild constipation.   potassium chloride SA (KLOR-CON) 20 MEQ tablet Take 1 tablet (20 mEq total) by mouth daily. Take 40 mg on the Days that you take Lasix   sodium chloride (OCEAN) 0.65 % SOLN nasal spray Place 1 spray into both nostrils as needed for congestion.   triamcinolone cream (KENALOG) 0.1 % Apply 1 application topically daily as needed (itching).   [DISCONTINUED] linezolid (ZYVOX) 600 MG tablet Take 600 mg by mouth 2 (two) times daily.   No facility-administered encounter medications on file as of 10/21/2020.     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   Paroxysmal atrial fibrillation ([place]) 04/05/2017   Prolapsed internal hemorrhoids, grade 4, s/p ligation/pexy/hemorrhoidectomy x 2 02/24/2017 02/24/2017   OSA (obstructive sleep apnea) 07/26/2016   Obesity (BMI 30-39.9) 07/26/2016   Atrial fibrillation ([place])    Hypertension    Chest pain at rest      Health Maintenance Due  Topic Date Due   Hepatitis C Screening  Never done   Zoster Vaccines- Shingrix (1 of 2) Never done   COLONOSCOPY (Pts 45-68yr Insurance coverage will need to be confirmed)  Never done   COVID-19 Vaccine (3 - Pfizer risk series) 07/11/2019     Review of Systems 12 point ros is negative except what is mentioned in hpi (rash and lower extremity swelling and diarrhea. No longer having fevers) Physical Exam   BP (!) 147/76   Pulse (!) 59   Temp 98.1 F (36.7 C)   Resp 16   Ht '5\' 10"'  (1.778 m)   Wt 258 lb (117 kg)   SpO2 96%   BMI 37.02 kg/m   Physical Exam  Constitutional: He is oriented to person, place, and time. He appears well-developed and well-nourished. No distress.  HENT:  Mouth/Throat: Oropharynx is clear and moist. No oropharyngeal exudate.  Cardiovascular: Normal rate, regular rhythm and normal heart sounds. Exam reveals no gallop and no friction rub.  No murmur heard.  Pulmonary/Chest: Effort normal and breath sounds normal. No respiratory distress. He has no wheezes.  Abdominal: Soft. Bowel sounds are normal. He exhibits no distension. There is no tenderness.  Ext:+2 pitting edema LE birally. RUE pic line is c/d/i Skin:  right hip incision is well healed no fluctuance or erythema. No warmth to region; blanching fading rahs to thighs and abdomena Psychiatric: He has a normal mood and affect. His behavior is normal.    CBC Lab Results  Component Value Date   WBC 10.2 10/20/2020   RBC 3.72 (L) 10/20/2020   HGB 11.4 (L) 10/20/2020   HCT 35.3 (L) 10/20/2020   PLT 386 10/20/2020   MCV 94.9 10/20/2020   MCH 30.6 10/20/2020   MCHC 32.3 10/20/2020   RDW 14.1 10/20/2020   LYMPHSABS 0.8 10/20/2020   MONOABS 0.7 10/20/2020   EOSABS 1.7 (H) 10/20/2020    BMET Lab Results  Component Value Date   NA 135 10/16/2020   K 3.4 (L) 10/16/2020   CL 100 10/16/2020   CO2 24 10/16/2020   GLUCOSE 121 (H) 10/16/2020  BUN 20 10/16/2020   CREATININE 0.81 10/16/2020   CALCIUM 8.4 (L) 10/16/2020   GFRNONAA >60 10/13/2020   GFRAA 108 12/04/2019    Assessment and Plan  PJI = would like to try to have patient continue with current ceftriaxone plus linezolid through 7/8. Will continue to check plt, and kidney function  Drug rash with eosinophilia = initially it was thought to be due to vancomycin (which stopped on 6/13) then daptomycin from 6/14-6/17. But eosinophilia still trending upward, despite vanco and daptomycin having been stopped.  We will stop ceftriaxone for the next few days. Plan to change to ertapenem early next week  Have repeat cbc with diff next week to evaluate # that it is trending downward  Fluid overload = agree with lasix and need to check potassium and cr on Tuesday blood work to see if needs further repletion  Diarrhea = suspect abtx associated diarrhea. If worsens, will test for cdifficile  Spent 40 min with patient reviewing data and discussing plan

## 2020-10-23 ENCOUNTER — Telehealth: Payer: Self-pay | Admitting: Pharmacist

## 2020-10-23 MED ORDER — SODIUM CHLORIDE 0.9 % IV SOLN: 1.0000 g | INTRAVENOUS | Status: DC

## 2020-10-23 NOTE — Telephone Encounter (Signed)
Per Dr. Baxter Flattery, called and spoke to [facility] at [facility]. Gave verbal orders to stop patient's ceftriaxone IV and to start ertapenem 1gm IV daily on Monday 6/27. Same end date through 7/8. Mary verbalized understanding.   Orville Govern will go to patient's house to do first dose. An anaphylaxis kit will be sent just in case.

## 2020-10-24 ENCOUNTER — Inpatient Hospital Stay: Payer: Medicare Other | Admitting: Internal Medicine

## 2020-10-25 DIAGNOSIS — T8451XA Infection and inflammatory reaction due to internal right hip prosthesis, initial encounter: Secondary | ICD-10-CM | POA: Diagnosis not present

## 2020-10-27 DIAGNOSIS — T8452XA Infection and inflammatory reaction due to internal left hip prosthesis, initial encounter: Secondary | ICD-10-CM | POA: Diagnosis not present

## 2020-10-27 DIAGNOSIS — T8451XA Infection and inflammatory reaction due to internal right hip prosthesis, initial encounter: Secondary | ICD-10-CM | POA: Diagnosis not present

## 2020-11-01 DIAGNOSIS — T8451XA Infection and inflammatory reaction due to internal right hip prosthesis, initial encounter: Secondary | ICD-10-CM | POA: Diagnosis not present

## 2020-11-04 ENCOUNTER — Ambulatory Visit: Payer: Medicare Other | Admitting: Internal Medicine

## 2020-11-04 DIAGNOSIS — T8451XA Infection and inflammatory reaction due to internal right hip prosthesis, initial encounter: Secondary | ICD-10-CM | POA: Diagnosis not present

## 2020-11-04 DIAGNOSIS — T8452XA Infection and inflammatory reaction due to internal left hip prosthesis, initial encounter: Secondary | ICD-10-CM | POA: Diagnosis not present

## 2020-11-05 ENCOUNTER — Other Ambulatory Visit: Payer: Self-pay

## 2020-11-05 ENCOUNTER — Ambulatory Visit: Payer: Medicare Other | Admitting: Infectious Diseases

## 2020-11-05 ENCOUNTER — Encounter: Payer: Self-pay | Admitting: Infectious Diseases

## 2020-11-05 ENCOUNTER — Telehealth: Payer: Self-pay

## 2020-11-05 DIAGNOSIS — T50905A Adverse effect of unspecified drugs, medicaments and biological substances, initial encounter: Secondary | ICD-10-CM | POA: Diagnosis not present

## 2020-11-05 DIAGNOSIS — D7212 Drug rash with eosinophilia and systemic symptoms syndrome: Secondary | ICD-10-CM | POA: Diagnosis not present

## 2020-11-05 DIAGNOSIS — T8451XA Infection and inflammatory reaction due to internal right hip prosthesis, initial encounter: Secondary | ICD-10-CM | POA: Diagnosis not present

## 2020-11-05 DIAGNOSIS — Z95828 Presence of other vascular implants and grafts: Secondary | ICD-10-CM | POA: Diagnosis not present

## 2020-11-05 NOTE — Telephone Encounter (Signed)
Spoke to Amy with [facility] to report verbal ordres per Janene [name], FNP to end antibiotics and pull PICC on 11/07/2020. Amy verbalized her understanding.    [place], CMA

## 2020-11-05 NOTE — Patient Instructions (Signed)
Will call your home health team to remove your PICC line after the 8th.   Your blood work looks better - the white blood cell count is still a little elevated but this may be more due to the allergic reaction to the antibiotics.   I would like to repeat your blood work in 2 weeks when you go back to see Dr. Alvan Dame - please either schedule a lab visit here with [place] or we can ask Dr. Alvan Dame to draw a CBC with differential, CRP and ESR. (I can also sent  him a message).

## 2020-11-05 NOTE — Progress Notes (Signed)
Patient: Travis [name]  DOB: [birth year] MRN: [id number] PCP: Lajean Manes, MD    Subjective:  Travis [name] is a 70 y.o. with a history of right hip prosthetic joint infection currently undergoing treatment following hardware removal for two-stage resection arthroplasty on 09/25/2020 (Dr. Alvan Dame).  Intraoperative cultures were negative and he was discharged on vancomycin plus ceftriaxone via PICC line in [facility].   On 10/13/2020 developed fever, rash, diarrhea with work-up revealing peripheral eosinophilia, drug rash and pneumonia that was thought to be due to vancomycin.  New antibiotic regimen going forward daptomycin plus ceftriaxone once daily.  Follow-up with Dr. Johnnye Sima on 10/16/2020 felt to be more consistent with fluid overload, persistent/worsening drug rash which prompted ceftriaxone to be on hold temporarily.  On 10/16/2020 placed on oral linezolid and resumed ceftriaxone. He still having trouble with marked lower extremity edema and had follow-up with his cardiologist [name] recently and initiated Lasix every other day. Follow-up with Dr. Baxter Flattery on 10/21/2020 with uptrending eosinophilia, ceftriaxone was stopped with plans to initiate ertapenem the week of 10/27/2020 and ongoing linezolid.  Also noted to have diarrhea less than 3 times per day that was described to be more associated with antibiotic use.  6 week treatment to be completed 7/08.  Fortunately he has done much better with regards to side effects on linezolid p.o. and ertapenem IV once daily.  The rash has improved now only with a faint remnant of it and very mild itching on the back at night.  Regarding his right hip, he reports very mild pain while walking around feels like it is more muscle related than bone.  Uses a walker in the home.  Edema looks better on the lasix/potassium every other day as directed by his cardiologist.  His legs are much less swollen. Reports 3 dental procedures over the last 12 months with the  most recent earlier this year. He takes PO antibiotic prophylaxis prior to these.   Review of Systems  Constitutional:  Negative for chills and fever.  HENT:  Negative for tinnitus.   Eyes:  Negative for blurred vision and photophobia.  Respiratory:  Negative for cough and sputum production.   Cardiovascular:  Positive for leg swelling (improved). Negative for chest pain.  Gastrointestinal:  Negative for diarrhea, nausea and vomiting.  Genitourinary:  Negative for dysuria.  Musculoskeletal:  Positive for joint pain.  Skin:  Positive for itching. Negative for rash.  Neurological:  Negative for headaches.  PICC line is without pain, drainage or erythema and is well maintained by [facility] Team. No swelling or altered sensation in affected distal extremity.     Past Medical History:  Diagnosis Date   Arthritis    "comes and goes; mostly in hands, occasionally elbow" (17/0/0174)   Complication of anesthesia    "just wanted to sleep alot  for couple days S/P VARICOCELE OR" (04/05/2017)   DVT (deep venous thrombosis) (HCC)    LLE   Dysrhythmia    PVCs    GERD (gastroesophageal reflux disease)    History of hiatal hernia    Hypertension    Impaired glucose tolerance    Myeloma ([place])    Obesity (BMI 30-39.9) 07/26/2016   OSA on CPAP 07/26/2016   Mild with AHI 12.5/hr now on CPAP at 14cm H2O   PAF (paroxysmal atrial fibrillation) ([place])    s/p PVI Ablation in 2018 // Flecainide Rx // Echo 8/21: EF 60-65, no RWMA, mild LVH, normal RV SF, moderate  RVE, mild BAE, trivial MR, mild dilation of aortic root (40 mm)   Pneumonia    "may have had walking pneumonia a few years ago" (04/05/2017)   Pre-diabetes    Thrombophlebitis     Outpatient Medications Prior to Visit  Medication Sig Dispense Refill   amLODipine (NORVASC) 10 MG tablet Take 10 mg by mouth daily.      apixaban (ELIQUIS) 5 MG TABS tablet Take 1 tablet (5 mg total) by mouth 2 (two) times daily. 60 tablet 10   b complex  vitamins tablet Take 1 tablet by mouth daily.     cholecalciferol (VITAMIN D3) 25 MCG (1000 UNIT) tablet Take 1,000 Units by mouth daily.     diltiazem (CARDIZEM) 30 MG tablet Take 1 Tablet Every 4 Hours As Needed For Afib HR Over 100. If using frequently please call our office [phone] 30 tablet 1   ertapenem 1,000 mg in sodium chloride 0.9 % 100 mL Inject 1,000 mg into the vein daily.     flecainide (TAMBOCOR) 50 MG tablet Take 1.5 tablets (75 mg total) by mouth 2 (two) times daily. 379 tablet 3   folic acid (FOLVITE) 1 MG tablet Take 1 mg by mouth daily.     furosemide (LASIX) 40 MG tablet Take 1 tablet (40 mg total) by mouth every other day. 40 tablet 1   gabapentin (NEURONTIN) 300 MG capsule TAKE TWO CAPSULES BY MOUTH TWICE A DAY 180 capsule 3   hydrochlorothiazide (HYDRODIURIL) 25 MG tablet Take 1 tablet (25 mg total) by mouth daily. 90 tablet 3   linezolid (ZYVOX) 600 MG tablet Take 1 tablet (600 mg total) by mouth 2 (two) times daily. 60 tablet 0   lisinopril (ZESTRIL) 40 MG tablet TAKE ONE TABLET BY MOUTH ONE TIME DAILY (Patient taking differently: Take 40 mg by mouth daily.) 90 tablet 3   metoprolol succinate (TOPROL-XL) 100 MG 24 hr tablet Take 100 mg by mouth daily.      oxymetazoline (AFRIN) 0.05 % nasal spray Place 1 spray into both nostrils 2 (two) times daily as needed for congestion.     potassium chloride SA (KLOR-CON) 20 MEQ tablet Take 1 tablet (20 mEq total) by mouth daily. Take 40 mg on the Days that you take Lasix 135 tablet 3   sodium chloride (OCEAN) 0.65 % SOLN nasal spray Place 1 spray into both nostrils as needed for congestion.     triamcinolone cream (KENALOG) 0.1 % Apply 1 application topically daily as needed (itching).     fluticasone (FLONASE) 50 MCG/ACT nasal spray Place 1 spray into both nostrils daily as needed for allergies or rhinitis. (Patient not taking: Reported on 11/05/2020)     METAMUCIL FIBER PO Take 1 Dose by mouth at bedtime. 1 dose = 1 teaspoonful  (Patient not taking: Reported on 11/05/2020)     polyethylene glycol (MIRALAX / GLYCOLAX) 17 g packet Take 17 g by mouth daily as needed for mild constipation. (Patient not taking: Reported on 11/05/2020) 14 each 0   No facility-administered medications prior to visit.     Allergies  Allergen Reactions   Vancomycin Rash    Social History   Tobacco Use   Smoking status: Never   Smokeless tobacco: Never  Vaping Use   Vaping Use: Never used  Substance Use Topics   Alcohol use: Yes    Alcohol/week: 14.0 standard drinks    Types: 14 Glasses of wine per week    Comment: wine on weekend  Drug use: No    Family History  Problem Relation Age of Onset   Dementia Mother    Stroke Father    Atrial fibrillation Father    Hypertension Father    Hypertension Paternal Grandfather    Dementia Maternal Grandmother     Objective:   Vitals:   11/05/20 1353 11/05/20 1354  BP:  129/72  Pulse:  (!) 54  Temp:  97.9 F (36.6 C)  TempSrc:  Oral  Weight: 247 lb 6.4 oz (112.2 kg) 247 lb (112 kg)   Body mass index is 35.44 kg/m.  Physical Exam Constitutional:      Appearance: He is well-developed.     Comments: Seated comfortably in chair during visit.   HENT:     Mouth/Throat:     Dentition: Normal dentition. No dental abscesses.  Cardiovascular:     Rate and Rhythm: Normal rate and regular rhythm.     Heart sounds: Normal heart sounds.  Pulmonary:     Effort: Pulmonary effort is normal.     Breath sounds: Normal breath sounds.  Abdominal:     General: There is no distension.     Palpations: Abdomen is soft.     Tenderness: There is no abdominal tenderness.  Musculoskeletal:     Right hip: Tenderness (mild to surrounding musculature) present.     Right lower leg: 2+ Edema (ankles to lower shins) present.     Left lower leg: 2+ Edema (ankle to lower shin) present.       Legs:     Comments: Well-healed surgical scar overlying the right hip extending down to the lateral mid  thigh.  No induration or fluctuance.  Lymphadenopathy:     Cervical: No cervical adenopathy.  Skin:    General: Skin is warm and dry.     Findings: No rash.  Neurological:     Mental Status: He is alert and oriented to person, place, and time.  Psychiatric:        Judgment: Judgment normal.     Comments: In good spirits today and engaged in care discussion.   RUE PICC line - clean/dry dressing. Insertion site w/o erythema, tenderness, drainage, cording or distal swelling of affected extremity    Lab Results: OPAT:   11/04/2020 -  WBC 11.6 (Eos 1.3) Plt 256 Scr 0.82 ESR 39 CRP 6  10/28/2020 -  WBC 9. (Eos 1.3) Plt 439 Scr 0.81 ESR 34 CRP 5  10/22/2020 -  WBC 8.1 (Eos 1.5) Scr 0.82 ESR 31 CRP 58  Assessment & Plan:   Problem List Items Addressed This Visit       Unprioritized   Status post peripherally inserted central catheter (PICC) central line placement    Site unremarkable, well maintained and functioning as expected. Continue care and maintenance until planned end of IV antibiotics. Home Health team to remove at completion after 11/07/20.        Infection of right prosthetic hip joint [facility])    Mr. Ravenscroft is here with his wife for follow-up.  He has had some trouble with various antibiotics during treatment including drug rash to vancomycin + ceftriaxone with peripheral eosinophilia, fluid overload (likely from extra fluid from IV antibiotics / flushes).  He has been tolerating current regimen of PO Linezolid BID + IV ertapenem without any problems.  In review of his lab results his CRP has normalized and ESR is down to less than 40; white blood cell count slightly elevated today at 11,000 and still  with slight eosinophilia 1.3.  Clinically he is doing very well with mild pain on, well-healed incision without any concern for infection.  We will continue current treatment regimen until 8 July as planned and stop antibiotics and monitor prior to consideration for  reimplantation of the joint.  He has a follow-up with Dr. Ihor Gully again in July 20.  Would like for him to have repeat CBC with differential, ESR, CRP the week of July 18th for close follow up off antibiotics.        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  Rash has nearly resolved. Will repeat cbc with differential in 10-14 days.          Janene [name], MSN, NP-C [facility] [place] Pager: [phone] Office: [phone]  11/06/20  8:47 AM

## 2020-11-06 DIAGNOSIS — D7212 Drug rash with eosinophilia and systemic symptoms syndrome: Secondary | ICD-10-CM | POA: Insufficient documentation

## 2020-11-06 DIAGNOSIS — T50905A Adverse effect of unspecified drugs, medicaments and biological substances, initial encounter: Secondary | ICD-10-CM | POA: Insufficient documentation

## 2020-11-06 DIAGNOSIS — Z95828 Presence of other vascular implants and grafts: Secondary | ICD-10-CM | POA: Insufficient documentation

## 2020-11-06 NOTE — Assessment & Plan Note (Signed)
Eosinophilia still present but improving slowly.  Rash has nearly resolved. Will repeat cbc with differential in 10-14 days.

## 2020-11-06 NOTE — Assessment & Plan Note (Signed)
Site unremarkable, well maintained and functioning as expected. Continue care and maintenance until planned end of IV antibiotics. Home Health team to remove at completion after 11/07/20.

## 2020-11-06 NOTE — Assessment & Plan Note (Signed)
Mr. Eustice is here with his wife for follow-up.  He has had some trouble with various antibiotics during treatment including drug rash to vancomycin + ceftriaxone with peripheral eosinophilia, fluid overload (likely from extra fluid from IV antibiotics / flushes).  He has been tolerating current regimen of PO Linezolid BID + IV ertapenem without any problems.  In review of his lab results his CRP has normalized and ESR is down to less than 40; white blood cell count slightly elevated today at 11,000 and still with slight eosinophilia 1.3.  Clinically he is doing very well with mild pain on, well-healed incision without any concern for infection.  We will continue current treatment regimen until 8 July as planned and stop antibiotics and monitor prior to consideration for reimplantation of the joint.  He has a follow-up with Dr. Ihor Gully again in July 20.  Would like for him to have repeat CBC with differential, ESR, CRP the week of July 18th for close follow up off antibiotics.

## 2020-11-07 DIAGNOSIS — T8451XA Infection and inflammatory reaction due to internal right hip prosthesis, initial encounter: Secondary | ICD-10-CM | POA: Diagnosis not present

## 2020-11-10 ENCOUNTER — Encounter: Payer: Medicare Other | Attending: Geriatric Medicine | Admitting: Dietician

## 2020-11-10 ENCOUNTER — Encounter: Payer: Self-pay | Admitting: Dietician

## 2020-11-10 ENCOUNTER — Other Ambulatory Visit: Payer: Self-pay

## 2020-11-10 DIAGNOSIS — R7303 Prediabetes: Secondary | ICD-10-CM | POA: Diagnosis not present

## 2020-11-10 NOTE — Progress Notes (Signed)
On 11/10/2020 patient completed the Diabetes Prevention Program course virtually with [facility]. By the end of this session patients are able to complete the following objectives:   Virtual Visit via Video Note  I connected with Travis [name], 12-09-1950 by a video enabled application and verified that I am speaking with the correct person using two identifiers.  Location: Patient: Virtual Provider: Office  Learning Objectives: Define fiber and describe the difference between insoluble and soluble fiber  List foods that are good sources of fiber Explain the health benefits of fiber  Describe ways to increase volume of meals and snacks while staying within fat goal.   Goals:  Record weight taken outside of class.  Track foods and beverages eaten each day in the "Food and Activity Tracker," including calories and fat grams for each item.   Track activity type, minutes you were active, and distance you reached each day in the "Food and Activity Tracker."   Follow-Up Plan: Attend next session.  Email completed "Food and Activity Trackers" before next session to be reviewed by Lifestyle Coach.

## 2020-11-19 DIAGNOSIS — Z471 Aftercare following joint replacement surgery: Secondary | ICD-10-CM | POA: Diagnosis not present

## 2020-11-19 DIAGNOSIS — Z96641 Presence of right artificial hip joint: Secondary | ICD-10-CM | POA: Diagnosis not present

## 2020-11-24 ENCOUNTER — Encounter (HOSPITAL_BASED_OUTPATIENT_CLINIC_OR_DEPARTMENT_OTHER): Payer: Medicare Other | Admitting: Dietician

## 2020-11-24 ENCOUNTER — Other Ambulatory Visit: Payer: Self-pay

## 2020-11-24 DIAGNOSIS — R7303 Prediabetes: Secondary | ICD-10-CM | POA: Diagnosis not present

## 2020-11-24 NOTE — Progress Notes (Signed)
On 11/24/2020 patient completed a post core session of the Diabetes Prevention Program course virtually with [facility]. By the end of this session patients are able to complete the following objectives:   Virtual Visit via Video Note  I connected with Travis [name], 05-27-1950 by a video enabled application and verified that I am speaking with the correct person using two identifiers.  Location: Patient: Virtual Provider: Office  Learning Objectives: List ways to make recipes healthier.  List lower-fat and lower-calorie substitutions for common ingredients.  Identify low-fat cooking methods.  Describe how to choose a cookbook that works best for their needs.   Goals:  Record weight taken outside of class.  Track foods and beverages eaten each day in the "Food and Activity Tracker," including calories and fat grams for each item.   Track activity type, minutes you were active, and distance you reached each day in the "Food and Activity Tracker."   Follow-Up Plan: Attend next session.  Email completed "Food and Activity Trackers" before next session to be reviewed by Lifestyle Coach.

## 2020-12-01 ENCOUNTER — Encounter: Payer: Medicare Other | Attending: Geriatric Medicine

## 2020-12-01 DIAGNOSIS — R7303 Prediabetes: Secondary | ICD-10-CM | POA: Insufficient documentation

## 2020-12-08 ENCOUNTER — Encounter (HOSPITAL_BASED_OUTPATIENT_CLINIC_OR_DEPARTMENT_OTHER): Payer: Medicare Other | Admitting: Dietician

## 2020-12-08 ENCOUNTER — Other Ambulatory Visit: Payer: Self-pay

## 2020-12-08 DIAGNOSIS — R7303 Prediabetes: Secondary | ICD-10-CM | POA: Diagnosis not present

## 2020-12-08 NOTE — Progress Notes (Signed)
On 12/08/2020 patient attended a virtual grocery store tour session as part of the Diabetes Prevention Program with [facility].  Virtual Visit via Video Note  I connected with Satira Anis. Drennon, Jan 12, 1951 by a video enabled application and verified that I am speaking with the correct person using two identifiers.  Location: Patient: Virtual Provider: Office   Learning Objectives: Develop a plan for our grocery shopping experience Putting together a list How to navigate the grocery store Identify 4 main sections of the grocery store Produce Meat/Poultry/Fish Dairy  Inside Aisles Consider tips for shopping in each of these four sections Reflect on our own shopping habits Create a new goal for our next grocery shopping experience Engage in a group discussion  Goals:  Record weight taken outside of class.  Track foods and beverages eaten each day in the "Food and Activity Tracker," including calories and fat grams for each item.  Create one new goal for the next grocery shopping experience based on the information provided today  Follow-Up Plan: Attend next session.  Email completed "Food and Activity Tracker" before next session to be reviewed by Lifestyle Coach.

## 2020-12-11 NOTE — Progress Notes (Addendum)
COVID test: 12/23/20  COVID Vaccine Completed: yes x3 Date COVID Vaccine completed: 05/23/19, 06/13/19 Has received booster: COVID vaccine manufacturer: Pfizer      Date of COVID positive in last 90 days: No  PCP - Lajean Manes, MD Cardiologist - [name], MD Electrophysiologist- Thompson Grayer, MD  Chest x-ray - 10/17/20 Epic EKG - 09/18/20 Epic Stress Test - 02/18/16 Epic ECHO - 12/04/19 Epic Cardiac Cath - 04/05/17 Epic Pacemaker/ICD device last checked: No Spinal Cord Stimulator: No  Sleep Study - yes positive  CPAP - every night  Fasting Blood Sugar - Pre DM per pt, diet controlled Checks Blood Sugar _____ times a day  Blood Thinner Instructions: Eliquis Aspirin Instructions: Last Dose: stop 2 days before surgery  Activity level: Can go up a flight of stairs and perform activities of daily living without stopping and without symptoms of chest pain or shortness of breath. Has to take stairs slow due to hip.       Anesthesia review: 1st degree AV block, DVT, OSA, A fib, HTN  Patient denies shortness of breath, fever, cough and chest pain at PAT appointment   Patient verbalized understanding of instructions that were given to them at the PAT appointment. Patient was also instructed that they will need to review over the PAT instructions again at home before surgery.

## 2020-12-11 NOTE — Patient Instructions (Addendum)
DUE TO COVID-19 ONLY ONE VISITOR IS ALLOWED TO COME WITH YOU AND STAY IN THE WAITING ROOM ONLY DURING PRE OP AND PROCEDURE.   **NO VISITORS ARE ALLOWED IN THE SHORT STAY AREA OR RECOVERY ROOM!!**  IF YOU WILL BE ADMITTED INTO THE HOSPITAL YOU ARE ALLOWED ONLY TWO SUPPORT PEOPLE DURING VISITATION HOURS ONLY (10AM -8PM)   The support person(s) may change daily. The support person(s) must pass our screening, gel in and out, and wear a mask at all times, including in the patient's room. Patients must also wear a mask when staff or their support person are in the room.  No visitors under the age of 9. Any visitor under the age of 71 must be accompanied by an adult.    COVID SWAB TESTING MUST BE COMPLETED ON:  12/23/20 **MUST PRESENT COMPLETED FORM AT TESTING SITE**    [facility] [address] [place] (backside of the building). Open 8am-3pm. No appointment needed. You are not required to quarantine, however you are required to wear a well-fitted mask when you are out and around people not in your household.  Hand Hygiene often Do NOT share personal items Notify your provider if you are in close contact with someone who has COVID or you develop fever 100.4 or greater, new onset of sneezing, cough, sore throat, shortness of breath or body aches.       Your procedure is scheduled on: 12/25/20   Report to [facility] Main  Entrance    Report to admitting at 8:45 AM   Call this number if you have problems the morning of surgery [phone]   Do not eat food :After Midnight.   May have liquids until   8:15 AM day of surgery  CLEAR LIQUID DIET  Foods Allowed                                                                     Foods Excluded  Water, Black Coffee and tea, regular and decaf               liquids that you cannot  Plain Jell-O in any flavor  (No red)                                    see through such as: Fruit ices (not with fruit pulp)                                             milk, soups, orange juice              Iced Popsicles (No red)                                                All solid food  Apple juices Sports drinks like Gatorade (No red) Lightly seasoned clear broth or consume(fat free) Sugar, honey syrup        The day of surgery:  Drink ONE (1) Pre-Surgery Clear Ensure or G2 by 8:15 am the morning of surgery. Drink in one sitting. Do not sip.  This drink was given to you during your hospital  pre-op appointment visit. Nothing else to drink after completing the  Pre-Surgery Clear Ensure or G2.          If you have questions, please contact your surgeon's office.     Oral Hygiene is also important to reduce your risk of infection.                                    Remember - BRUSH YOUR TEETH THE MORNING OF SURGERY WITH YOUR REGULAR TOOTHPASTE   Take these medicines the morning of surgery with A SIP OF WATER: Amlodipine, Diltiazem, Tambocor, Gabapentin, Metoprolol.                              You may not have any metal on your body including hair pins, jewelry, and body piercing             Do not wear lotions, powders, cologne, or deodorant              Men may shave face and neck.   Do not bring valuables to the hospital. [place].   Bring small overnight bag day of surgery.   Bring CPAP mask and tubing day of surgery.   Follow instructions given to you regarding Eliquis before surgery.   Please read over the following fact sheets you were given: IF YOU HAVE QUESTIONS ABOUT YOUR PRE OP INSTRUCTIONS PLEASE CALL [facility] - Preparing for Surgery Before surgery, you can play an important role.  Because skin is not sterile, your skin needs to be as free of germs as possible.  You can reduce the number of germs on your skin by washing with CHG (chlorahexidine gluconate) soap before surgery.  CHG is an antiseptic cleaner  which kills germs and bonds with the skin to continue killing germs even after washing. Please DO NOT use if you have an allergy to CHG or antibacterial soaps.  If your skin becomes reddened/irritated stop using the CHG and inform your nurse when you arrive at Short Stay. Do not shave (including legs and underarms) for at least 48 hours prior to the first CHG shower.  You may shave your face/neck.  Please follow these instructions carefully:  1.  Shower with CHG Soap the night before surgery and the  morning of surgery.  2.  If you choose to wash your hair, wash your hair first as usual with your normal  shampoo.  3.  After you shampoo, rinse your hair and body thoroughly to remove the shampoo.                             4.  Use CHG as you would any other liquid soap.  You can apply chg directly to the skin and wash.  Gently with a scrungie or clean washcloth.  5.  Apply the CHG Soap to your body ONLY FROM THE NECK DOWN.   Do   not use on face/ open                           Wound or open sores. Avoid contact with eyes, ears mouth and   genitals (private parts).                       Wash face,  Genitals (private parts) with your normal soap.             6.  Wash thoroughly, paying special attention to the area where your    surgery  will be performed.  7.  Thoroughly rinse your body with warm water from the neck down.  8.  DO NOT shower/wash with your normal soap after using and rinsing off the CHG Soap.                9.  Pat yourself dry with a clean towel.            10.  Wear clean pajamas.            11.  Place clean sheets on your bed the night of your first shower and do not  sleep with pets. Day of Surgery : Do not apply any lotions/deodorants the morning of surgery.  Please wear clean clothes to the hospital/surgery center.  FAILURE TO FOLLOW THESE INSTRUCTIONS MAY RESULT IN THE CANCELLATION OF YOUR SURGERY  PATIENT SIGNATURE_________________________________  NURSE  SIGNATURE__________________________________  ________________________________________________________________________   Adam Phenix  An incentive spirometer is a tool that can help keep your lungs clear and active. This tool measures how well you are filling your lungs with each breath. Taking long deep breaths may help reverse or decrease the chance of developing breathing (pulmonary) problems (especially infection) following: A long period of time when you are unable to move or be active. BEFORE THE PROCEDURE  If the spirometer includes an indicator to show your best effort, your nurse or respiratory therapist will set it to a desired goal. If possible, sit up straight or lean slightly forward. Try not to slouch. Hold the incentive spirometer in an upright position. INSTRUCTIONS FOR USE  Sit on the edge of your bed if possible, or sit up as far as you can in bed or on a chair. Hold the incentive spirometer in an upright position. Breathe out normally. Place the mouthpiece in your mouth and seal your lips tightly around it. Breathe in slowly and as deeply as possible, raising the piston or the ball toward the top of the column. Hold your breath for 3-5 seconds or for as long as possible. Allow the piston or ball to fall to the bottom of the column. Remove the mouthpiece from your mouth and breathe out normally. Rest for a few seconds and repeat Steps 1 through 7 at least 10 times every 1-2 hours when you are awake. Take your time and take a few normal breaths between deep breaths. The spirometer may include an indicator to show your best effort. Use the indicator as a goal to work toward during each repetition. After each set of 10 deep breaths, practice coughing to be sure your lungs are clear. If you have an incision (the cut made at the time of surgery), support your incision when coughing by placing a pillow or rolled up towels firmly against it. Once  you are able to get out of  bed, walk around indoors and cough well. You may stop using the incentive spirometer when instructed by your caregiver.  RISKS AND COMPLICATIONS Take your time so you do not get dizzy or light-headed. If you are in pain, you may need to take or ask for pain medication before doing incentive spirometry. It is harder to take a deep breath if you are having pain. AFTER USE Rest and breathe slowly and easily. It can be helpful to keep track of a log of your progress. Your caregiver can provide you with a simple table to help with this. If you are using the spirometer at home, follow these instructions: [place] IF:  You are having difficultly using the spirometer. You have trouble using the spirometer as often as instructed. Your pain medication is not giving enough relief while using the spirometer. You develop fever of 100.5 F (38.1 C) or higher. SEEK IMMEDIATE MEDICAL CARE IF:  You cough up bloody sputum that had not been present before. You develop fever of 102 F (38.9 C) or greater. You develop worsening pain at or near the incision site. MAKE SURE YOU:  Understand these instructions. Will watch your condition. Will get help right away if you are not doing well or get worse. Document Released: 08/30/2006 Document Revised: 07/12/2011 Document Reviewed: 10/31/2006 ExitCare Patient Information 2014 ExitCare, [place].   ________________________________________________________________________  WHAT IS A BLOOD TRANSFUSION? Blood Transfusion Information  A transfusion is the replacement of blood or some of its parts. Blood is made up of multiple cells which provide different functions. Red blood cells carry oxygen and are used for blood loss replacement. White blood cells fight against infection. Platelets control bleeding. Plasma helps clot blood. Other blood products are available for specialized needs, such as hemophilia or other clotting disorders. BEFORE THE TRANSFUSION   Who gives blood for transfusions?  Healthy volunteers who are fully evaluated to make sure their blood is safe. This is blood bank blood. Transfusion therapy is the safest it has ever been in the practice of medicine. Before blood is taken from a donor, a complete history is taken to make sure that person has no history of diseases nor engages in risky social behavior (examples are intravenous drug use or sexual activity with multiple partners). The donor's travel history is screened to minimize risk of transmitting infections, such as malaria. The donated blood is tested for signs of infectious diseases, such as HIV and hepatitis. The blood is then tested to be sure it is compatible with you in order to minimize the chance of a transfusion reaction. If you or a relative donates blood, this is often done in anticipation of surgery and is not appropriate for emergency situations. It takes many days to process the donated blood. RISKS AND COMPLICATIONS Although transfusion therapy is very safe and saves many lives, the main dangers of transfusion include:  Getting an infectious disease. Developing a transfusion reaction. This is an allergic reaction to something in the blood you were given. Every precaution is taken to prevent this. The decision to have a blood transfusion has been considered carefully by your caregiver before blood is given. Blood is not given unless the benefits outweigh the risks. AFTER THE TRANSFUSION Right after receiving a blood transfusion, you will usually feel much better and more energetic. This is especially true if your red blood cells have gotten low (anemic). The transfusion raises the level of the red blood cells which  carry oxygen, and this usually causes an energy increase. The nurse administering the transfusion will monitor you carefully for complications. HOME CARE INSTRUCTIONS  No special instructions are needed after a transfusion. You may find your energy is  better. Speak with your caregiver about any limitations on activity for underlying diseases you may have. SEEK MEDICAL CARE IF:  Your condition is not improving after your transfusion. You develop redness or irritation at the intravenous (IV) site. SEEK IMMEDIATE MEDICAL CARE IF:  Any of the following symptoms occur over the next 12 hours: Shaking chills. You have a temperature by mouth above 102 F (38.9 C), not controlled by medicine. Chest, back, or muscle pain. People around you feel you are not acting correctly or are confused. Shortness of breath or difficulty breathing. Dizziness and fainting. You get a rash or develop hives. You have a decrease in urine output. Your urine turns a dark color or changes to pink, red, or brown. Any of the following symptoms occur over the next 10 days: You have a temperature by mouth above 102 F (38.9 C), not controlled by medicine. Shortness of breath. Weakness after normal activity. The white part of the eye turns yellow (jaundice). You have a decrease in the amount of urine or are urinating less often. Your urine turns a dark color or changes to pink, red, or brown. Document Released: 04/16/2000 Document Revised: 07/12/2011 Document Reviewed: 12/04/2007 [facility] Patient Information 2014 [place], [place].  _______________________________________________________________________

## 2020-12-16 ENCOUNTER — Encounter (HOSPITAL_COMMUNITY)
Admission: RE | Admit: 2020-12-16 | Discharge: 2020-12-16 | Disposition: A | Discharge status: Home / Self Care | Payer: Medicare Other | Source: Ambulatory Visit | Attending: Orthopedic Surgery | Admitting: Orthopedic Surgery

## 2020-12-16 ENCOUNTER — Other Ambulatory Visit: Payer: Self-pay

## 2020-12-16 ENCOUNTER — Encounter (HOSPITAL_COMMUNITY): Payer: Self-pay

## 2020-12-16 DIAGNOSIS — I493 Ventricular premature depolarization: Secondary | ICD-10-CM | POA: Insufficient documentation

## 2020-12-16 DIAGNOSIS — Z7901 Long term (current) use of anticoagulants: Secondary | ICD-10-CM | POA: Diagnosis not present

## 2020-12-16 DIAGNOSIS — I10 Essential (primary) hypertension: Secondary | ICD-10-CM | POA: Diagnosis not present

## 2020-12-16 DIAGNOSIS — Z01812 Encounter for preprocedural laboratory examination: Secondary | ICD-10-CM | POA: Diagnosis not present

## 2020-12-16 DIAGNOSIS — Z86718 Personal history of other venous thrombosis and embolism: Secondary | ICD-10-CM | POA: Diagnosis not present

## 2020-12-16 DIAGNOSIS — I480 Paroxysmal atrial fibrillation: Secondary | ICD-10-CM | POA: Diagnosis not present

## 2020-12-16 DIAGNOSIS — G4733 Obstructive sleep apnea (adult) (pediatric): Secondary | ICD-10-CM | POA: Diagnosis not present

## 2020-12-16 DIAGNOSIS — M12851 Other specific arthropathies, not elsewhere classified, right hip: Secondary | ICD-10-CM | POA: Insufficient documentation

## 2020-12-16 DIAGNOSIS — Z79899 Other long term (current) drug therapy: Secondary | ICD-10-CM | POA: Diagnosis not present

## 2020-12-16 LAB — CBC
HCT: 41.3 % (ref 39.0–52.0)
Hemoglobin: 13.8 g/dL (ref 13.0–17.0)
MCH: 31.1 pg (ref 26.0–34.0)
MCHC: 33.4 g/dL (ref 30.0–36.0)
MCV: 93 fL (ref 80.0–100.0)
Platelets: 251 10*3/uL (ref 150–400)
RBC: 4.44 MIL/uL (ref 4.22–5.81)
RDW: 16.1 % — ABNORMAL HIGH (ref 11.5–15.5)
WBC: 6.7 10*3/uL (ref 4.0–10.5)
nRBC: 0 % (ref 0.0–0.2)

## 2020-12-16 LAB — SURGICAL PCR SCREEN
MRSA, PCR: NEGATIVE
Staphylococcus aureus: NEGATIVE

## 2020-12-16 LAB — COMPREHENSIVE METABOLIC PANEL
ALT: 19 U/L (ref 0–44)
AST: 19 U/L (ref 15–41)
Albumin: 4 g/dL (ref 3.5–5.0)
Alkaline Phosphatase: 88 U/L (ref 38–126)
Anion gap: 7 (ref 5–15)
BUN: 19 mg/dL (ref 8–23)
CO2: 27 mmol/L (ref 22–32)
Calcium: 9.1 mg/dL (ref 8.9–10.3)
Chloride: 103 mmol/L (ref 98–111)
Creatinine, Ser: 0.7 mg/dL (ref 0.61–1.24)
GFR, Estimated: >60 mL/min (ref >60)
Glucose, Bld: 117 mg/dL — ABNORMAL HIGH (ref 70–99)
Potassium: 3.3 mmol/L — ABNORMAL LOW (ref 3.5–5.1)
Sodium: 137 mmol/L (ref 135–145)
Total Bilirubin: 0.6 mg/dL (ref 0.3–1.2)
Total Protein: 6.7 g/dL (ref 6.5–8.1)

## 2020-12-17 NOTE — Progress Notes (Signed)
Anesthesia Chart Review:   Case: [id number] Date/Time: 12/25/20 1100   Procedure: REIMPLANTATION/REVISION OF RIGHT TOTAL HIP VERSUS REPEAT IRRIGATION AND DEBRIDEMENT AND PLACEMENT OF ANTIBIOTIC SPACER (Right: Hip)   Anesthesia type: Spinal   Pre-op diagnosis: Status post resection of Right total hip arthroplasty and placement of antibiotic spacer   [facility] ROOM 09 / [facility]   Surgeons: Paralee Cancel, MD       DISCUSSION: Pt is 70 years old with hx PAF (s/p ablation 2018), PVCs, HTN, OSA, DVT, impaired glucose tolerance, myeloma  S/p excisional R THA with spacers 09/25/20; no anesthesia complication   Pt reported he will hold eliquis 2 days before surgery. Case is posted for spinal anesthesia. I left a voicemail for [facility] in Dr. Aurea Graff office that eliquis needs to be held for 3 days for spinal anesthesia.    VS: BP 138/71   Pulse (!) 53   Temp 36.7 C (Oral)   Resp 16   Ht '5\' 10"'$  (1.778 m)   Wt 113.6 kg   SpO2 98%   BMI 35.93 kg/m   PROVIDERS: - PCP Is Lajean Manes, MD - Cardiologist is [name], MD. Last office visit 10/20/20  LABS: Labs reviewed: Acceptable for surgery. (all labs ordered are listed, but only abnormal results are displayed)  Labs Reviewed  CBC - Abnormal; Notable for the following components:      Result Value   RDW 16.1 (*)    All other components within normal limits  COMPREHENSIVE METABOLIC PANEL - Abnormal; Notable for the following components:   Potassium 3.3 (*)    Glucose, Bld 117 (*)    All other components within normal limits  SURGICAL PCR SCREEN  TYPE AND SCREEN     IMAGES: CXR 10/16/20:  - Cardiomegaly with small bilateral pleural effusions and developing pulmonary interstitial thickening. Findings are suspicious for pulmonary venous congestion. - Similar right infrahilar and probable medial left lower lobe patchy opacities which could represent atelectasis or mild infection.    EKG 09/18/20: Sinus bradycardia with 1st degree  A-V block. Cannot rule out Anterior infarct , age undetermined. Appears stable compared to prior   CV: Echo 12/04/19:  1. Left ventricular ejection fraction, by estimation, is 60 to 65%. The left ventricle has normal function. The left ventricle has no regional wall motion abnormalities. There is mild left ventricular hypertrophy. Left ventricular diastolic parameters were normal.   2. Right ventricular systolic function is normal. The right ventricular size is moderately enlarged.   3. Left atrial size was mildly dilated.   4. Right atrial size was mildly dilated.   5. The mitral valve is normal in structure. Trivial mitral valve regurgitation. No evidence of mitral stenosis.   6. The aortic valve is tricuspid. Aortic valve regurgitation is not visualized. No aortic stenosis is present.   7. Aortic dilatation noted. There is mild dilatation of the aortic root measuring 40 mm.   8. The inferior vena cava is normal in size with greater than 50% respiratory variability, suggesting right atrial pressure of 3 mmHg.      Long term cardiac event monitor (3-7 days) 01/26/18:  Predominant rhythm is sinus rhythm Rare premature atrial contractions and premature ventricular contractions Nonsustained atrial tachycardia is observed No sustained arrhythmias     Nuclear stress test 02/18/16:  Nuclear stress EF: 60%. Blood pressure demonstrated a normal response to exercise. There was no ST segment deviation noted during stress. The study is normal. This is a low risk study.  The left ventricular ejection fraction is normal (55-65%). Normal resting and stress perfusion. No ischemia or infarction EF 60%   Past Medical History:  Diagnosis Date   Arthritis    "comes and goes; mostly in hands, occasionally elbow" (0000000)   Complication of anesthesia    "just wanted to sleep alot  for couple days S/P VARICOCELE OR" (04/05/2017)   DVT (deep venous thrombosis) (HCC)    LLE   Dysrhythmia    PVCs     GERD (gastroesophageal reflux disease)    History of hiatal hernia    Hypertension    Impaired glucose tolerance    Myeloma ([place])    Obesity (BMI 30-39.9) 07/26/2016   OSA on CPAP 07/26/2016   Mild with AHI 12.5/hr now on CPAP at 14cm H2O   PAF (paroxysmal atrial fibrillation) ([place])    s/p PVI Ablation in 2018 // Flecainide Rx // Echo 8/21: EF 60-65, no RWMA, mild LVH, normal RV SF, moderate RVE, mild BAE, trivial MR, mild dilation of aortic root (40 mm)   Pneumonia    "may have had walking pneumonia a few years ago" (04/05/2017)   Pre-diabetes    Thrombophlebitis     Past Surgical History:  Procedure Laterality Date   ATRIAL FIBRILLATION ABLATION  04/05/2017   ATRIAL FIBRILLATION ABLATION N/A 04/05/2017   Procedure: ATRIAL FIBRILLATION ABLATION;  Surgeon: Thompson Grayer, MD;  Location: [facility];  Service: Cardiovascular;  Laterality: N/A;   COMPLETE RIGHT HIP REPLACMENT  01/19/2019   EVALUATION UNDER ANESTHESIA WITH HEMORRHOIDECTOMY N/A 02/24/2017   Procedure: EXAM UNDER ANESTHESIA WITH  HEMORRHOIDECTOMY;  Surgeon: Michael Boston, MD;  [facility];  Service: General;  Laterality: N/A;   EXCISIONAL TOTAL HIP ARTHROPLASTY WITH ANTIBIOTIC SPACERS Right 09/25/2020   Procedure: EXCISIONAL TOTAL HIP ARTHROPLASTY WITH ANTIBIOTIC SPACERS;  Surgeon: Paralee Cancel, MD;  [facility];  Service: Orthopedics;  Laterality: Right;   FLEXIBLE SIGMOIDOSCOPY N/A 04/15/2015   Procedure:  UNSEDATED FLEXIBLE SIGMOIDOSCOPY;  Surgeon: [name], MD;  [facility];  Service: Endoscopy;  Laterality: N/A;   KNEE ARTHROSCOPY Left ~ 2016   TESTICLE SURGERY  1988   VARICOCELE   TONSILLECTOMY     VARICOSE VEIN SURGERY Bilateral     MEDICATIONS:  amLODipine (NORVASC) 10 MG tablet   apixaban (ELIQUIS) 5 MG TABS tablet   b complex vitamins tablet   cholecalciferol (VITAMIN D3) 25 MCG (1000 UNIT) tablet   diltiazem (CARDIZEM) 30 MG tablet   ertapenem 1,000 mg in sodium chloride  0.9 % 100 mL   flecainide (TAMBOCOR) 50 MG tablet   fluticasone (FLONASE) 50 MCG/ACT nasal spray   folic acid (FOLVITE) 1 MG tablet   furosemide (LASIX) 40 MG tablet   gabapentin (NEURONTIN) 300 MG capsule   hydrochlorothiazide (HYDRODIURIL) 25 MG tablet   linezolid (ZYVOX) 600 MG tablet   lisinopril (ZESTRIL) 40 MG tablet   metoprolol succinate (TOPROL-XL) 100 MG 24 hr tablet   oxymetazoline (AFRIN) 0.05 % nasal spray   polyethylene glycol (MIRALAX / GLYCOLAX) 17 g packet   potassium chloride SA (KLOR-CON) 20 MEQ tablet   sodium chloride (OCEAN) 0.65 % SOLN nasal spray   triamcinolone cream (KENALOG) 0.1 %   No current facility-administered medications for this encounter.    If no changes, I anticipate pt can proceed with surgery as scheduled.   Willeen Cass, PhD, FNP-BC [facility]/Anesthesiology Phone: [phone] 12/17/2020 1:54 PM

## 2020-12-17 NOTE — Anesthesia Preprocedure Evaluation (Addendum)
Anesthesia Evaluation  Patient identified by MRN, date of birth, ID band Patient awake    Reviewed: Allergy & Precautions, NPO status , Patient's Chart, lab work & pertinent test results  History of Anesthesia Complications Negative for: history of anesthetic complications  Airway Mallampati: II  TM Distance: >3 FB Neck ROM: Full    Dental  (+) Dental Advisory Given, Teeth Intact   Pulmonary sleep apnea and Continuous Positive Airway Pressure Ventilation ,  12/23/2020 SARS coronavirus NEG   Pulmonary exam normal breath sounds clear to auscultation       Cardiovascular hypertension, Pt. on medications + dysrhythmias  Rhythm:Regular Rate:Normal  '21 ECHO: EF 60 to 65%. The LV has normal function, no regional wall motion abnormalities, mild LVH, no significant valvular abnormalities.    Neuro/Psych negative neurological ROS     GI/Hepatic Neg liver ROS, GERD  Medicated and Controlled,  Endo/Other  Morbid obesity  Renal/GU negative Renal ROS     Musculoskeletal  (+) Arthritis ,   Abdominal (+) + obese,   Peds  Hematology Eliquis: last dose Monday pm 9:30   Anesthesia Other Findings   Reproductive/Obstetrics                           Anesthesia Physical Anesthesia Plan  ASA: 3  Anesthesia Plan: General   Post-op Pain Management:    Induction: Intravenous  PONV Risk Score and Plan: 2 and Ondansetron and Dexamethasone  Airway Management Planned: Oral ETT  Additional Equipment: None  Intra-op Plan:   Post-operative Plan: Extubation in OR  Informed Consent: I have reviewed the patients History and Physical, chart, labs and discussed the procedure including the risks, benefits and alternatives for the proposed anesthesia with the patient or authorized representative who has indicated his/her understanding and acceptance.     Dental advisory given  Plan Discussed with: CRNA and  Surgeon  Anesthesia Plan Comments: (See APP note by A. Kabbe, FNP   Lat dose Eliquis 60 hours ago, plan GETA since not candidate for SAB Pt changes his mind, wants SAB, now believes last dose Eliquis 9:30am. Given the questions around last Eliquis, pt understands need for and agrees to [facility])     Anesthesia Quick Evaluation

## 2020-12-18 ENCOUNTER — Other Ambulatory Visit: Payer: Self-pay | Admitting: Orthopedic Surgery

## 2020-12-18 DIAGNOSIS — M25551 Pain in right hip: Secondary | ICD-10-CM

## 2020-12-19 ENCOUNTER — Other Ambulatory Visit: Payer: Self-pay

## 2020-12-19 ENCOUNTER — Ambulatory Visit
Admission: RE | Admit: 2020-12-19 | Discharge: 2020-12-19 | Disposition: A | Discharge status: Home / Self Care | Payer: Medicare Other | Source: Ambulatory Visit | Attending: Orthopedic Surgery | Admitting: Orthopedic Surgery

## 2020-12-19 DIAGNOSIS — M25551 Pain in right hip: Secondary | ICD-10-CM

## 2020-12-19 DIAGNOSIS — T8451XA Infection and inflammatory reaction due to internal right hip prosthesis, initial encounter: Secondary | ICD-10-CM | POA: Diagnosis not present

## 2020-12-19 DIAGNOSIS — I10 Essential (primary) hypertension: Secondary | ICD-10-CM

## 2020-12-23 ENCOUNTER — Other Ambulatory Visit: Payer: Self-pay | Admitting: Orthopedic Surgery

## 2020-12-23 LAB — SARS CORONAVIRUS 2 (TAT 6-24 HRS): SARS Coronavirus 2: NEGATIVE

## 2020-12-24 LAB — ANAEROBIC AND AEROBIC CULTURE
AER RESULT:: NO GROWTH
MICRO NUMBER:: 12266291
MICRO NUMBER:: 12266292
SPECIMEN QUALITY:: ADEQUATE
SPECIMEN QUALITY:: ADEQUATE

## 2020-12-24 LAB — SYNOVIAL FLUID ANALYSIS, COMPLETE
Basophils, %: 0 %
Eosinophils-Synovial: 0 % (ref 0–2)
Lymphocytes-Synovial Fld: 23 % (ref 0–74)
Monocyte/Macrophage: 0 % (ref 0–69)
Neutrophil, Synovial: 77 % — ABNORMAL HIGH (ref 0–24)
Synoviocytes, %: 0 % (ref 0–15)
WBC, Synovial: 2313 cells/uL — ABNORMAL HIGH (ref <150)

## 2020-12-24 NOTE — H&P (Signed)
TOTAL HIP REVISION ADMISSION H&P  Patient is admitted for reimplantation of right total hip arthroplasty vs repeat irrigation and debridement with placement of antibiotic spacer  Subjective:  Chief Complaint: Status post right total hip arthroplasty resection  HPI: Travis [name], 70 y.o. male, has a history of primary THA in 2020 by Dr. Creig Hines. He was doing well until October 2021, when he began having increased pain. Lab results revealed a CRP of 11 with a high normal of 8 and sedimentation rate of 28 with a high normal of 20. Right hip aspiration results revealed 15,000 white blood cells. His neutrophils were only 51. There was no growth on culture of hip aspiration. He underwent right total hip arthroplasty resection with antibiotic spacer placement on 09/26/20. He was on 6 weeks of IV abx. We recently repeated his ESR which was 28, and CRP which was now 4.6. Based on these mixed results, Dr. Alvan Dame did wish to obtain repeat aspiration of the hip. We will arrange for this pre-operatively in order to help guide surgical treatment.  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   Paroxysmal atrial fibrillation ([place]) 04/05/2017   Prolapsed internal hemorrhoids, grade 4, s/p ligation/pexy/hemorrhoidectomy x 2 02/24/2017 02/24/2017   OSA (obstructive sleep apnea) 07/26/2016   Obesity (BMI 30-39.9) 07/26/2016   Atrial fibrillation ([place])    Hypertension    Chest pain at rest    Past Medical  History:  Diagnosis Date   Arthritis    "comes and goes; mostly in hands, occasionally elbow" (74/01/4495)   Complication of anesthesia    "just wanted to sleep alot  for couple days S/P VARICOCELE OR" (04/05/2017)   DVT (deep venous thrombosis) (HCC)    LLE   Dysrhythmia    PVCs    GERD (gastroesophageal reflux disease)    History of hiatal hernia    Hypertension    Impaired glucose tolerance    Myeloma ([place])    Obesity (BMI 30-39.9) 07/26/2016   OSA on CPAP 07/26/2016   Mild with AHI 12.5/hr now on CPAP at 14cm H2O   PAF (paroxysmal atrial fibrillation) ([place])    s/p PVI Ablation in 2018 // Flecainide Rx // Echo 8/21: EF 60-65, no RWMA, mild LVH, normal RV SF, moderate RVE, mild BAE, trivial MR, mild dilation of aortic root (40 mm)   Pneumonia    "may have had walking pneumonia a few years ago" (04/05/2017)   Pre-diabetes    Thrombophlebitis     Past Surgical History:  Procedure Laterality Date   ATRIAL FIBRILLATION ABLATION  04/05/2017   ATRIAL FIBRILLATION ABLATION N/A 04/05/2017   Procedure: ATRIAL FIBRILLATION ABLATION;  Surgeon: Thompson Grayer, MD;  Location: [facility];  Service: Cardiovascular;  Laterality: N/A;   COMPLETE RIGHT HIP REPLACMENT  01/19/2019   EVALUATION UNDER ANESTHESIA WITH HEMORRHOIDECTOMY N/A 02/24/2017   Procedure: EXAM UNDER ANESTHESIA WITH  HEMORRHOIDECTOMY;  Surgeon: Michael Boston, MD;  Location: [facility];  Service: General;  Laterality: N/A;   EXCISIONAL TOTAL HIP ARTHROPLASTY WITH ANTIBIOTIC SPACERS  Right 09/25/2020   Procedure: EXCISIONAL TOTAL HIP ARTHROPLASTY WITH ANTIBIOTIC SPACERS;  Surgeon: Paralee Cancel, MD;  Location: [facility];  Service: Orthopedics;  Laterality: Right;   FLEXIBLE SIGMOIDOSCOPY N/A 04/15/2015   Procedure:  UNSEDATED FLEXIBLE SIGMOIDOSCOPY;  Surgeon: [name], MD;  Location: [facility];  Service: Endoscopy;  Laterality: N/A;   KNEE ARTHROSCOPY Left ~ 2016   TESTICLE SURGERY  1988   VARICOCELE   TONSILLECTOMY      VARICOSE VEIN SURGERY Bilateral     No current facility-administered medications for this encounter.   Current Outpatient Medications  Medication Sig Dispense Refill Last Dose   amLODipine (NORVASC) 10 MG tablet Take 10 mg by mouth daily.       apixaban (ELIQUIS) 5 MG TABS tablet Take 1 tablet (5 mg total) by mouth 2 (two) times daily. 60 tablet 10    b complex vitamins tablet Take 1 tablet by mouth daily.      cholecalciferol (VITAMIN D3) 25 MCG (1000 UNIT) tablet Take 1,000 Units by mouth daily.      diltiazem (CARDIZEM) 30 MG tablet Take 1 Tablet Every 4 Hours As Needed For Afib HR Over 100. If using frequently please call our office [phone] 30 tablet 1    flecainide (TAMBOCOR) 50 MG tablet Take 1.5 tablets (75 mg total) by mouth 2 (two) times daily. 270 tablet 3    fluticasone (FLONASE) 50 MCG/ACT nasal spray Place 1 spray into both nostrils daily as needed for allergies or rhinitis.      folic acid (FOLVITE) 1 MG tablet Take 1 mg by mouth daily.      furosemide (LASIX) 40 MG tablet Take 1 tablet (40 mg total) by mouth every other day. 40 tablet 1    gabapentin (NEURONTIN) 300 MG capsule TAKE TWO CAPSULES BY MOUTH TWICE A DAY 180 capsule 3    hydrochlorothiazide (HYDRODIURIL) 25 MG tablet Take 1 tablet (25 mg total) by mouth daily. 90 tablet 3    lisinopril (ZESTRIL) 40 MG tablet TAKE ONE TABLET BY MOUTH ONE TIME DAILY (Patient taking differently: Take 40 mg by mouth daily.) 90 tablet 3    metoprolol succinate (TOPROL-XL) 100 MG 24 hr tablet Take 100 mg by mouth daily.       oxymetazoline (AFRIN) 0.05 % nasal spray Place 1 spray into both nostrils 2 (two) times daily as needed for congestion.      potassium chloride SA (KLOR-CON) 20 MEQ tablet Take 1 tablet (20 mEq total) by mouth daily. Take 40 mg on the Days that you take Lasix 135 tablet 3    sodium chloride (OCEAN) 0.65 % SOLN nasal spray Place 1 spray into both nostrils as needed for congestion.      triamcinolone cream (KENALOG)  0.1 % Apply 1 application topically daily as needed (itching).      ertapenem 1,000 mg in sodium chloride 0.9 % 100 mL Inject 1,000 mg into the vein daily. (Patient not taking: No sig reported)   Not Taking   linezolid (ZYVOX) 600 MG tablet Take 1 tablet (600 mg total) by mouth 2 (two) times daily. (Patient not taking: No sig reported) 60 tablet 0 Not Taking   polyethylene glycol (MIRALAX / GLYCOLAX) 17 g packet Take 17 g by mouth daily as needed for mild constipation. 14 each 0    Allergies  Allergen Reactions   Vancomycin Rash    Social History   Tobacco Use   Smoking status: Never   Smokeless tobacco: Never  Substance Use Topics   Alcohol use: Yes    Alcohol/week: 14.0 standard drinks    Types: 14 Glasses of wine per week    Comment: wine on weekend     Family History  Problem Relation Age of Onset   Dementia Mother    Stroke Father    Atrial fibrillation Father    Hypertension Father    Hypertension Paternal Grandfather    Dementia Maternal Grandmother       Review of Systems  Constitutional:  Negative for chills and fever.  Respiratory:  Negative for cough and shortness of breath.   Cardiovascular:  Negative for chest pain.  Gastrointestinal:  Negative for nausea and vomiting.  Musculoskeletal:  Positive for arthralgias.   Objective:  Physical Exam Well nourished and well developed. General: Alert and oriented x3, cooperative and pleasant, no acute distress. Head: normocephalic, atraumatic, neck supple. Eyes: EOMI.  Musculoskeletal: Right hip exam: His surgical incision at this point remains healed without obvious drainage. Mild warmth but no significant erythema Stiffness and limited range of motion both passive and actively Neurovascular intact distally  Calves soft and nontender. Motor function intact in LE. Strength 5/5 LE bilaterally. Neuro: Distal pulses 2+. Sensation to light touch intact in LE. Vital signs in last 24 hours:      Labs:   Estimated body mass index is 35.93 kg/m as calculated from the following:   Height as of 12/16/20: _0  (1.778 m).   Weight as of 12/16/20: 113.6 kg.  Imaging Review:  In office, an AP pelvis was ordered to review with him the articulating spacer. His femoral and acetabular components from this cemented antibiotic spacer are intact without evidence of any complications or concerns.   Assessment/Plan:  History of right total arthroplasty resection with placement of antibiotic spacer  The patient history, physical examination, clinical judgement of the provider and imaging studies are consistent with history of right total hip arthroplasty resection with antibiotic spacer placement of the right hip(s), previous total hip arthroplasty. Revision total hip arthroplasty is deemed medically necessary. The treatment options including medical management, injection therapy, arthroscopy and arthroplasty were discussed at length. The risks and benefits of total hip arthroplasty were presented and reviewed. The risks due to aseptic loosening, infection, stiffness, dislocation/subluxation,  thromboembolic complications and other imponderables were discussed.  The patient acknowledged the explanation, agreed to proceed with the plan and consent was signed. Patient is being admitted for inpatient treatment for surgery, pain control, PT, OT, prophylactic antibiotics, VTE prophylaxis, progressive ambulation and ADL's and discharge planning. The patient is planning to be discharged  home with home health services.   Therapy Plans: HEP Disposition: Home with wife Planned DVT Prophylaxis: Eliquis 5 mg BID (hx of a fib and DVT) DME needed: none PCP: Dr. Felipa Eth, clearance received Cardiologist: [name], clearance received TXA: IV Allergies: Vancomycin- rash Anesthesia Concerns: none BMI: 37.6 Last HgbA1c: Not diabetic.  Other: - Hydrocodone (has at home), robaxin - TOC needs - needs a  ride home, wife wants help with IV abx therapy from nurse/CNA  Griffith Citron, PA-C Orthopedic Surgery [facility] [place] [phone]

## 2020-12-25 ENCOUNTER — Inpatient Hospital Stay (HOSPITAL_COMMUNITY)
Admission: RE | Admit: 2020-12-25 | Discharge: 2020-12-27 | DRG: 468 | Disposition: A | Discharge status: Home / Self Care | Payer: Medicare Other | Attending: Orthopedic Surgery | Admitting: Orthopedic Surgery

## 2020-12-25 ENCOUNTER — Inpatient Hospital Stay (HOSPITAL_COMMUNITY): Payer: Medicare Other | Admitting: Emergency Medicine

## 2020-12-25 ENCOUNTER — Other Ambulatory Visit: Payer: Self-pay

## 2020-12-25 ENCOUNTER — Encounter (HOSPITAL_COMMUNITY)
Admission: RE | Disposition: A | Discharge status: Home / Self Care | Payer: Self-pay | Source: Home / Self Care | Attending: Orthopedic Surgery

## 2020-12-25 ENCOUNTER — Inpatient Hospital Stay (HOSPITAL_COMMUNITY): Payer: Medicare Other

## 2020-12-25 ENCOUNTER — Encounter (HOSPITAL_COMMUNITY): Payer: Self-pay | Admitting: Orthopedic Surgery

## 2020-12-25 ENCOUNTER — Inpatient Hospital Stay (HOSPITAL_COMMUNITY): Payer: Medicare Other | Admitting: Certified Registered"

## 2020-12-25 DIAGNOSIS — Z7901 Long term (current) use of anticoagulants: Secondary | ICD-10-CM | POA: Diagnosis not present

## 2020-12-25 DIAGNOSIS — Y831 Surgical operation with implant of artificial internal device as the cause of abnormal reaction of the patient, or of later complication, without mention of misadventure at the time of the procedure: Secondary | ICD-10-CM | POA: Diagnosis present

## 2020-12-25 DIAGNOSIS — Z79899 Other long term (current) drug therapy: Secondary | ICD-10-CM

## 2020-12-25 DIAGNOSIS — I10 Essential (primary) hypertension: Secondary | ICD-10-CM | POA: Diagnosis present

## 2020-12-25 DIAGNOSIS — Z8672 Personal history of thrombophlebitis: Secondary | ICD-10-CM

## 2020-12-25 DIAGNOSIS — T84090A Other mechanical complication of internal right hip prosthesis, initial encounter: Principal | ICD-10-CM | POA: Diagnosis present

## 2020-12-25 DIAGNOSIS — Y792 Prosthetic and other implants, materials and accessory orthopedic devices associated with adverse incidents: Secondary | ICD-10-CM | POA: Diagnosis present

## 2020-12-25 DIAGNOSIS — Z86718 Personal history of other venous thrombosis and embolism: Secondary | ICD-10-CM | POA: Diagnosis not present

## 2020-12-25 DIAGNOSIS — Z6837 Body mass index (BMI) 37.0-37.9, adult: Secondary | ICD-10-CM | POA: Diagnosis not present

## 2020-12-25 DIAGNOSIS — R600 Localized edema: Secondary | ICD-10-CM | POA: Diagnosis not present

## 2020-12-25 DIAGNOSIS — Z823 Family history of stroke: Secondary | ICD-10-CM | POA: Diagnosis not present

## 2020-12-25 DIAGNOSIS — Z8249 Family history of ischemic heart disease and other diseases of the circulatory system: Secondary | ICD-10-CM | POA: Diagnosis not present

## 2020-12-25 DIAGNOSIS — Z96649 Presence of unspecified artificial hip joint: Secondary | ICD-10-CM

## 2020-12-25 DIAGNOSIS — Z881 Allergy status to other antibiotic agents status: Secondary | ICD-10-CM

## 2020-12-25 DIAGNOSIS — Z8619 Personal history of other infectious and parasitic diseases: Secondary | ICD-10-CM

## 2020-12-25 DIAGNOSIS — Z9989 Dependence on other enabling machines and devices: Secondary | ICD-10-CM | POA: Diagnosis not present

## 2020-12-25 DIAGNOSIS — Z471 Aftercare following joint replacement surgery: Secondary | ICD-10-CM | POA: Diagnosis not present

## 2020-12-25 DIAGNOSIS — D472 Monoclonal gammopathy: Secondary | ICD-10-CM | POA: Diagnosis present

## 2020-12-25 DIAGNOSIS — G629 Polyneuropathy, unspecified: Secondary | ICD-10-CM | POA: Diagnosis not present

## 2020-12-25 DIAGNOSIS — G4733 Obstructive sleep apnea (adult) (pediatric): Secondary | ICD-10-CM | POA: Diagnosis not present

## 2020-12-25 DIAGNOSIS — M1990 Unspecified osteoarthritis, unspecified site: Secondary | ICD-10-CM | POA: Diagnosis not present

## 2020-12-25 DIAGNOSIS — T8451XD Infection and inflammatory reaction due to internal right hip prosthesis, subsequent encounter: Secondary | ICD-10-CM | POA: Diagnosis not present

## 2020-12-25 DIAGNOSIS — I480 Paroxysmal atrial fibrillation: Secondary | ICD-10-CM | POA: Diagnosis present

## 2020-12-25 DIAGNOSIS — K219 Gastro-esophageal reflux disease without esophagitis: Secondary | ICD-10-CM | POA: Diagnosis present

## 2020-12-25 DIAGNOSIS — T8451XA Infection and inflammatory reaction due to internal right hip prosthesis, initial encounter: Secondary | ICD-10-CM | POA: Diagnosis not present

## 2020-12-25 DIAGNOSIS — Z96641 Presence of right artificial hip joint: Secondary | ICD-10-CM | POA: Diagnosis not present

## 2020-12-25 HISTORY — PX: REIMPLANTATION OF TOTAL HIP: SHX6051

## 2020-12-25 LAB — PROTIME-INR
INR: 1 (ref 0.8–1.2)
Prothrombin Time: 12.9 seconds (ref 11.4–15.2)

## 2020-12-25 LAB — APTT: aPTT: 31 seconds (ref 24–36)

## 2020-12-25 LAB — TYPE AND SCREEN
ABO/RH(D): O POS
Antibody Screen: NEGATIVE

## 2020-12-25 SURGERY — REIMPLANTATION OF TOTAL HIP
Anesthesia: General | Site: Hip | Laterality: Right

## 2020-12-25 MED ORDER — PROPOFOL 10 MG/ML IV BOLUS
INTRAVENOUS | Status: AC
  Filled 2020-12-25: qty 20

## 2020-12-25 MED ORDER — ACETAMINOPHEN 325 MG PO TABS: 325.0000 mg | ORAL_TABLET | Freq: Four times a day (QID) | ORAL | Status: DC | PRN

## 2020-12-25 MED ORDER — HYDROCHLOROTHIAZIDE 25 MG PO TABS
25.0000 mg | ORAL_TABLET | Freq: Every day | ORAL | Status: DC
  Administered 2020-12-26 – 2020-12-27 (×2): 25 mg via ORAL
  Filled 2020-12-25 (×2): qty 1

## 2020-12-25 MED ORDER — DEXAMETHASONE SODIUM PHOSPHATE 10 MG/ML IJ SOLN
10.0000 mg | Freq: Once | INTRAMUSCULAR | Status: AC
  Administered 2020-12-26: 10 mg via INTRAVENOUS
  Filled 2020-12-25: qty 1

## 2020-12-25 MED ORDER — OXYCODONE HCL 5 MG PO TABS
ORAL_TABLET | ORAL | Status: AC
  Administered 2020-12-25: 5 mg via ORAL
  Filled 2020-12-25: qty 1

## 2020-12-25 MED ORDER — HYDROMORPHONE HCL 1 MG/ML IJ SOLN
INTRAMUSCULAR | Status: DC | PRN
  Administered 2020-12-25 (×2): .5 mg via INTRAVENOUS

## 2020-12-25 MED ORDER — METHOCARBAMOL 1000 MG/10ML IJ SOLN
500.0000 mg | Freq: Four times a day (QID) | INTRAVENOUS | Status: DC | PRN
  Filled 2020-12-25: qty 5

## 2020-12-25 MED ORDER — APIXABAN 5 MG PO TABS
5.0000 mg | ORAL_TABLET | Freq: Two times a day (BID) | ORAL | Status: DC
  Administered 2020-12-26 – 2020-12-27 (×3): 5 mg via ORAL
  Filled 2020-12-25 (×3): qty 1

## 2020-12-25 MED ORDER — FLUTICASONE PROPIONATE 50 MCG/ACT NA SUSP
1.0000 | Freq: Every day | NASAL | Status: DC | PRN
  Filled 2020-12-25: qty 16

## 2020-12-25 MED ORDER — SUGAMMADEX SODIUM 200 MG/2ML IV SOLN
INTRAVENOUS | Status: DC | PRN
  Administered 2020-12-25: 200 mg via INTRAVENOUS

## 2020-12-25 MED ORDER — PROPOFOL 10 MG/ML IV BOLUS
INTRAVENOUS | Status: DC | PRN
  Administered 2020-12-25: 150 mg via INTRAVENOUS

## 2020-12-25 MED ORDER — MIDAZOLAM HCL 2 MG/2ML IJ SOLN: 0.5000 mg | Freq: Once | INTRAMUSCULAR | Status: DC | PRN

## 2020-12-25 MED ORDER — EPHEDRINE SULFATE-NACL 50-0.9 MG/10ML-% IV SOSY
PREFILLED_SYRINGE | INTRAVENOUS | Status: DC | PRN
  Administered 2020-12-25 (×3): 5 mg via INTRAVENOUS
  Administered 2020-12-25: 10 mg via INTRAVENOUS
  Administered 2020-12-25: 5 mg via INTRAVENOUS
  Administered 2020-12-25: 10 mg via INTRAVENOUS

## 2020-12-25 MED ORDER — FUROSEMIDE 40 MG PO TABS
40.0000 mg | ORAL_TABLET | ORAL | Status: DC
  Administered 2020-12-26: 40 mg via ORAL
  Filled 2020-12-25: qty 1

## 2020-12-25 MED ORDER — FERROUS SULFATE 325 (65 FE) MG PO TABS
325.0000 mg | ORAL_TABLET | Freq: Three times a day (TID) | ORAL | Status: DC
  Administered 2020-12-25 – 2020-12-27 (×5): 325 mg via ORAL
  Filled 2020-12-25 (×6): qty 1

## 2020-12-25 MED ORDER — OXYCODONE HCL 5 MG PO TABS: 5.0000 mg | ORAL_TABLET | Freq: Once | ORAL | Status: AC | PRN

## 2020-12-25 MED ORDER — POLYETHYLENE GLYCOL 3350 17 G PO PACK: 17.0000 g | PACK | Freq: Every day | ORAL | Status: DC | PRN

## 2020-12-25 MED ORDER — AMLODIPINE BESYLATE 10 MG PO TABS
10.0000 mg | ORAL_TABLET | Freq: Every day | ORAL | Status: DC
  Administered 2020-12-26 – 2020-12-27 (×2): 10 mg via ORAL
  Filled 2020-12-25 (×2): qty 1

## 2020-12-25 MED ORDER — ONDANSETRON HCL 4 MG PO TABS: 4.0000 mg | ORAL_TABLET | Freq: Four times a day (QID) | ORAL | Status: DC | PRN

## 2020-12-25 MED ORDER — STERILE WATER FOR IRRIGATION IR SOLN
Status: DC | PRN
  Administered 2020-12-25 (×2): 1000 mL

## 2020-12-25 MED ORDER — METOPROLOL SUCCINATE ER 50 MG PO TB24
100.0000 mg | ORAL_TABLET | Freq: Every day | ORAL | Status: DC
  Administered 2020-12-26 – 2020-12-27 (×2): 100 mg via ORAL
  Filled 2020-12-25 (×2): qty 2

## 2020-12-25 MED ORDER — ONDANSETRON HCL 4 MG/2ML IJ SOLN: 4.0000 mg | Freq: Four times a day (QID) | INTRAMUSCULAR | Status: DC | PRN

## 2020-12-25 MED ORDER — TRANEXAMIC ACID-NACL 1000-0.7 MG/100ML-% IV SOLN
1000.0000 mg | INTRAVENOUS | Status: AC
  Administered 2020-12-25: 1000 mg via INTRAVENOUS
  Filled 2020-12-25: qty 100

## 2020-12-25 MED ORDER — OXYCODONE HCL 5 MG/5ML PO SOLN: 5.0000 mg | Freq: Once | ORAL | Status: AC | PRN

## 2020-12-25 MED ORDER — BISACODYL 10 MG RE SUPP: 10.0000 mg | Freq: Every day | RECTAL | Status: DC | PRN

## 2020-12-25 MED ORDER — MIDAZOLAM HCL 2 MG/2ML IJ SOLN
INTRAMUSCULAR | Status: DC | PRN
  Administered 2020-12-25: 2 mg via INTRAVENOUS

## 2020-12-25 MED ORDER — PROPOFOL 1000 MG/100ML IV EMUL
INTRAVENOUS | Status: AC
  Filled 2020-12-25: qty 100

## 2020-12-25 MED ORDER — ARTIFICIAL TEARS OPHTHALMIC OINT
TOPICAL_OINTMENT | OPHTHALMIC | Status: AC
  Filled 2020-12-25: qty 7

## 2020-12-25 MED ORDER — FENTANYL CITRATE (PF) 250 MCG/5ML IJ SOLN
INTRAMUSCULAR | Status: DC | PRN
  Administered 2020-12-25: 100 ug via INTRAVENOUS
  Administered 2020-12-25 (×2): 50 ug via INTRAVENOUS

## 2020-12-25 MED ORDER — MEPERIDINE HCL 50 MG/ML IJ SOLN: 6.2500 mg | INTRAMUSCULAR | Status: DC | PRN

## 2020-12-25 MED ORDER — PHENYLEPHRINE HCL-NACL 20-0.9 MG/250ML-% IV SOLN
INTRAVENOUS | Status: DC | PRN
  Administered 2020-12-25: 20 ug/min via INTRAVENOUS

## 2020-12-25 MED ORDER — PHENOL 1.4 % MT LIQD: 1.0000 | OROMUCOSAL | Status: DC | PRN

## 2020-12-25 MED ORDER — EPHEDRINE 5 MG/ML INJ
INTRAVENOUS | Status: AC
  Filled 2020-12-25: qty 10

## 2020-12-25 MED ORDER — FOLIC ACID 1 MG PO TABS
1.0000 mg | ORAL_TABLET | Freq: Every day | ORAL | Status: DC
  Administered 2020-12-25 – 2020-12-27 (×3): 1 mg via ORAL
  Filled 2020-12-25 (×2): qty 1

## 2020-12-25 MED ORDER — TOBRAMYCIN SULFATE 1.2 G IJ SOLR
INTRAMUSCULAR | Status: DC | PRN
  Administered 2020-12-25: 1.2 g via TOPICAL

## 2020-12-25 MED ORDER — POTASSIUM CHLORIDE CRYS ER 20 MEQ PO TBCR
20.0000 meq | EXTENDED_RELEASE_TABLET | Freq: Every day | ORAL | Status: DC
  Administered 2020-12-25 – 2020-12-27 (×3): 20 meq via ORAL
  Filled 2020-12-25 (×3): qty 1

## 2020-12-25 MED ORDER — FENTANYL CITRATE (PF) 100 MCG/2ML IJ SOLN
INTRAMUSCULAR | Status: AC
  Filled 2020-12-25: qty 2

## 2020-12-25 MED ORDER — GABAPENTIN 300 MG PO CAPS
600.0000 mg | ORAL_CAPSULE | Freq: Two times a day (BID) | ORAL | Status: DC
  Administered 2020-12-25 – 2020-12-27 (×4): 600 mg via ORAL
  Filled 2020-12-25 (×4): qty 2

## 2020-12-25 MED ORDER — CEFAZOLIN SODIUM-DEXTROSE 2-4 GM/100ML-% IV SOLN
2.0000 g | Freq: Four times a day (QID) | INTRAVENOUS | Status: AC
  Administered 2020-12-25 (×2): 2 g via INTRAVENOUS
  Filled 2020-12-25 (×2): qty 100

## 2020-12-25 MED ORDER — LIDOCAINE 2% (20 MG/ML) 5 ML SYRINGE
INTRAMUSCULAR | Status: AC
  Filled 2020-12-25: qty 5

## 2020-12-25 MED ORDER — DOCUSATE SODIUM 100 MG PO CAPS
100.0000 mg | ORAL_CAPSULE | Freq: Two times a day (BID) | ORAL | Status: DC
  Administered 2020-12-25 – 2020-12-27 (×4): 100 mg via ORAL
  Filled 2020-12-25 (×4): qty 1

## 2020-12-25 MED ORDER — ONDANSETRON HCL 4 MG/2ML IJ SOLN
INTRAMUSCULAR | Status: AC
  Filled 2020-12-25: qty 2

## 2020-12-25 MED ORDER — LISINOPRIL 20 MG PO TABS
40.0000 mg | ORAL_TABLET | Freq: Every day | ORAL | Status: DC
  Administered 2020-12-26 – 2020-12-27 (×2): 40 mg via ORAL
  Filled 2020-12-25 (×2): qty 2

## 2020-12-25 MED ORDER — DIPHENHYDRAMINE HCL 12.5 MG/5ML PO ELIX: 12.5000 mg | ORAL_SOLUTION | ORAL | Status: DC | PRN

## 2020-12-25 MED ORDER — HYDROMORPHONE HCL 2 MG/ML IJ SOLN
INTRAMUSCULAR | Status: AC
  Filled 2020-12-25: qty 1

## 2020-12-25 MED ORDER — MIDAZOLAM HCL 2 MG/2ML IJ SOLN
INTRAMUSCULAR | Status: AC
  Filled 2020-12-25: qty 2

## 2020-12-25 MED ORDER — METOCLOPRAMIDE HCL 5 MG PO TABS: 5.0000 mg | ORAL_TABLET | Freq: Three times a day (TID) | ORAL | Status: DC | PRN

## 2020-12-25 MED ORDER — HYDROMORPHONE HCL 1 MG/ML IJ SOLN
0.2500 mg | INTRAMUSCULAR | Status: DC | PRN
  Administered 2020-12-25: 0.25 mg via INTRAVENOUS

## 2020-12-25 MED ORDER — ORAL CARE MOUTH RINSE: 15.0000 mL | Freq: Once | OROMUCOSAL | Status: AC

## 2020-12-25 MED ORDER — MENTHOL 3 MG MT LOZG: 1.0000 | LOZENGE | OROMUCOSAL | Status: DC | PRN

## 2020-12-25 MED ORDER — SALINE SPRAY 0.65 % NA SOLN
1.0000 | NASAL | Status: DC | PRN
  Filled 2020-12-25: qty 44

## 2020-12-25 MED ORDER — MORPHINE SULFATE (PF) 2 MG/ML IV SOLN: 0.5000 mg | INTRAVENOUS | Status: DC | PRN

## 2020-12-25 MED ORDER — LACTATED RINGERS IV SOLN: INTRAVENOUS | Status: DC

## 2020-12-25 MED ORDER — ONDANSETRON HCL 4 MG/2ML IJ SOLN
INTRAMUSCULAR | Status: DC | PRN
  Administered 2020-12-25: 4 mg via INTRAVENOUS

## 2020-12-25 MED ORDER — HYDROCODONE-ACETAMINOPHEN 5-325 MG PO TABS
1.0000 | ORAL_TABLET | ORAL | Status: DC | PRN
  Administered 2020-12-26 (×3): 2 via ORAL
  Filled 2020-12-25 (×3): qty 2

## 2020-12-25 MED ORDER — 0.9 % SODIUM CHLORIDE (POUR BTL) OPTIME
TOPICAL | Status: DC | PRN
  Administered 2020-12-25: 1000 mL

## 2020-12-25 MED ORDER — HYDROMORPHONE HCL 1 MG/ML IJ SOLN
INTRAMUSCULAR | Status: AC
  Administered 2020-12-25: 0.25 mg via INTRAVENOUS
  Filled 2020-12-25: qty 1

## 2020-12-25 MED ORDER — ROCURONIUM BROMIDE 10 MG/ML (PF) SYRINGE
PREFILLED_SYRINGE | INTRAVENOUS | Status: DC | PRN
  Administered 2020-12-25: 60 mg via INTRAVENOUS

## 2020-12-25 MED ORDER — OXYMETAZOLINE HCL 0.05 % NA SOLN
1.0000 | Freq: Two times a day (BID) | NASAL | Status: DC | PRN
  Filled 2020-12-25: qty 15

## 2020-12-25 MED ORDER — METOCLOPRAMIDE HCL 5 MG/ML IJ SOLN: 5.0000 mg | Freq: Three times a day (TID) | INTRAMUSCULAR | Status: DC | PRN

## 2020-12-25 MED ORDER — LIDOCAINE 2% (20 MG/ML) 5 ML SYRINGE
INTRAMUSCULAR | Status: DC | PRN
  Administered 2020-12-25: 40 mg via INTRAVENOUS

## 2020-12-25 MED ORDER — CHLORHEXIDINE GLUCONATE 0.12 % MT SOLN
15.0000 mL | Freq: Once | OROMUCOSAL | Status: AC
  Administered 2020-12-25: 15 mL via OROMUCOSAL

## 2020-12-25 MED ORDER — TRANEXAMIC ACID-NACL 1000-0.7 MG/100ML-% IV SOLN
1000.0000 mg | Freq: Once | INTRAVENOUS | Status: AC
  Administered 2020-12-25: 1000 mg via INTRAVENOUS
  Filled 2020-12-25: qty 100

## 2020-12-25 MED ORDER — POVIDONE-IODINE 10 % EX SWAB
2.0000 "application " | Freq: Once | CUTANEOUS | Status: AC
  Administered 2020-12-25: 2 via TOPICAL

## 2020-12-25 MED ORDER — DILTIAZEM HCL 30 MG PO TABS
30.0000 mg | ORAL_TABLET | ORAL | Status: DC | PRN
  Filled 2020-12-25: qty 1

## 2020-12-25 MED ORDER — FLECAINIDE ACETATE 50 MG PO TABS
75.0000 mg | ORAL_TABLET | Freq: Two times a day (BID) | ORAL | Status: DC
  Administered 2020-12-25 – 2020-12-27 (×4): 75 mg via ORAL
  Filled 2020-12-25 (×4): qty 2

## 2020-12-25 MED ORDER — SODIUM CHLORIDE 0.9 % IR SOLN
Status: DC | PRN
  Administered 2020-12-25: 3000 mL

## 2020-12-25 MED ORDER — PROMETHAZINE HCL 25 MG/ML IJ SOLN: 6.2500 mg | INTRAMUSCULAR | Status: DC | PRN

## 2020-12-25 MED ORDER — DEXAMETHASONE SODIUM PHOSPHATE 10 MG/ML IJ SOLN
INTRAMUSCULAR | Status: AC
  Filled 2020-12-25: qty 1

## 2020-12-25 MED ORDER — METHOCARBAMOL 500 MG PO TABS
500.0000 mg | ORAL_TABLET | Freq: Four times a day (QID) | ORAL | Status: DC | PRN
  Administered 2020-12-25 – 2020-12-27 (×4): 500 mg via ORAL
  Filled 2020-12-25 (×4): qty 1

## 2020-12-25 MED ORDER — CEFAZOLIN SODIUM-DEXTROSE 2-4 GM/100ML-% IV SOLN
2.0000 g | INTRAVENOUS | Status: AC
Start: 2020-12-25 — End: 2020-12-25
  Administered 2020-12-25: 2 g via INTRAVENOUS
  Filled 2020-12-25: qty 100

## 2020-12-25 MED ORDER — HYDROCODONE-ACETAMINOPHEN 7.5-325 MG PO TABS
1.0000 | ORAL_TABLET | ORAL | Status: DC | PRN
  Administered 2020-12-25 (×2): 1 via ORAL
  Administered 2020-12-26: 2 via ORAL
  Administered 2020-12-27: 1 via ORAL
  Filled 2020-12-25: qty 2
  Filled 2020-12-25: qty 1
  Filled 2020-12-25: qty 2
  Filled 2020-12-25: qty 1

## 2020-12-25 MED ORDER — SODIUM CHLORIDE 0.9 % IV SOLN: INTRAVENOUS | Status: DC

## 2020-12-25 MED ORDER — DEXAMETHASONE SODIUM PHOSPHATE 10 MG/ML IJ SOLN
8.0000 mg | Freq: Once | INTRAMUSCULAR | Status: AC
  Administered 2020-12-25: 8 mg via INTRAVENOUS

## 2020-12-25 MED ORDER — TOBRAMYCIN SULFATE 1.2 G IJ SOLR
INTRAMUSCULAR | Status: AC
  Filled 2020-12-25: qty 1.2

## 2020-12-25 SURGICAL SUPPLY — 53 items
BAG COUNTER SPONGE SURGICOUNT (BAG) IMPLANT
BAG SPEC THK2 15X12 ZIP CLS (MISCELLANEOUS) ×1
BAG SPNG CNTER NS LX DISP (BAG)
BAG ZIPLOCK 12X15 (MISCELLANEOUS) ×2 IMPLANT
BLADE SAW SGTL 18X1.27X75 (BLADE) ×2 IMPLANT
BRUSH FEMORAL CANAL (MISCELLANEOUS) ×2 IMPLANT
COVER SURGICAL LIGHT HANDLE (MISCELLANEOUS) ×2 IMPLANT
CUP SECTOR GRIPTON 58MM (Orthopedic Implant) ×1 IMPLANT
DRAPE INCISE IOBAN 66X45 STRL (DRAPES) ×1 IMPLANT
DRAPE ORTHO SPLIT 77X108 STRL (DRAPES) ×4
DRAPE POUCH INSTRU U-SHP 10X18 (DRAPES) ×2 IMPLANT
DRAPE SURG 17X11 SM STRL (DRAPES) ×2 IMPLANT
DRAPE SURG ORHT 6 SPLT 77X108 (DRAPES) ×2 IMPLANT
DRAPE U-SHAPE 47X51 STRL (DRAPES) ×2 IMPLANT
DRESSING AQUACEL AG SP 3.5X10 (GAUZE/BANDAGES/DRESSINGS) IMPLANT
DRSG AQUACEL AG ADV 3.5X14 (GAUZE/BANDAGES/DRESSINGS) IMPLANT
DRSG AQUACEL AG SP 3.5X10 (GAUZE/BANDAGES/DRESSINGS)
DURAPREP 26ML APPLICATOR (WOUND CARE) ×2 IMPLANT
ELECT BLADE TIP CTD 4 INCH (ELECTRODE) ×2 IMPLANT
ELECT REM PT RETURN 15FT ADLT (MISCELLANEOUS) ×2 IMPLANT
ELIMINATOR HOLE APEX DEPUY (Hips) IMPLANT
FACESHIELD WRAPAROUND (MASK) ×8 IMPLANT
FACESHIELD WRAPAROUND OR TEAM (MASK) ×4 IMPLANT
GLOVE SURG UNDER POLY LF SZ7.5 (GLOVE) ×6 IMPLANT
GOWN STRL REUS W/TWL LRG LVL3 (GOWN DISPOSABLE) ×2 IMPLANT
HANDPIECE INTERPULSE COAX TIP (DISPOSABLE) ×2
HEAD CERAMIC 36 PLUS5 (Hips) ×1 IMPLANT
KIT BASIN OR (CUSTOM PROCEDURE TRAY) ×2 IMPLANT
KIT TURNOVER KIT A (KITS) ×2 IMPLANT
LINER NEUTRAL 36X58 PLUS4 ×1 IMPLANT
MANIFOLD NEPTUNE II (INSTRUMENTS) ×2 IMPLANT
NS IRRIG 1000ML POUR BTL (IV SOLUTION) ×4 IMPLANT
PACK TOTAL JOINT (CUSTOM PROCEDURE TRAY) ×2 IMPLANT
PENCIL SMOKE EVACUATOR (MISCELLANEOUS) IMPLANT
PROTECTOR NERVE ULNAR (MISCELLANEOUS) ×2 IMPLANT
SCREW 6.5MMX35MM (Screw) ×1 IMPLANT
SCREW PINN CAN 6.5X20 (Screw) ×1 IMPLANT
SET HNDPC FAN SPRY TIP SCT (DISPOSABLE) ×1 IMPLANT
SPONGE T-LAP 18X18 ~~LOC~~+RFID (SPONGE) ×2 IMPLANT
STAPLER VISISTAT 35W (STAPLE) ×2 IMPLANT
STEM FEM ACTIS HIGH SZ8 (Stem) ×1 IMPLANT
SUCTION FRAZIER HANDLE 12FR (TUBING) ×2
SUCTION TUBE FRAZIER 12FR DISP (TUBING) ×1 IMPLANT
SUT STRATAFIX 0 PDS 27 VIOLET (SUTURE) ×4
SUT VIC AB 1 CT1 36 (SUTURE) ×4 IMPLANT
SUT VIC AB 2-0 CT1 27 (SUTURE) ×6
SUT VIC AB 2-0 CT1 TAPERPNT 27 (SUTURE) ×3 IMPLANT
SUTURE STRATFX 0 PDS 27 VIOLET (SUTURE) ×2 IMPLANT
TIP HIGH FLOW IRRIGATION COAX (MISCELLANEOUS) ×1 IMPLANT
TOWEL OR 17X26 10 PK STRL BLUE (TOWEL DISPOSABLE) ×4 IMPLANT
TOWEL OR NON WOVEN STRL DISP B (DISPOSABLE) ×2 IMPLANT
TRAY FOLEY MTR SLVR 16FR STAT (SET/KITS/TRAYS/PACK) ×2 IMPLANT
WATER STERILE IRR 1000ML POUR (IV SOLUTION) ×4 IMPLANT

## 2020-12-25 NOTE — Interval H&P Note (Signed)
History and Physical Interval Note:  12/25/2020 8:51 AM  Travis [name]  has presented today for surgery, with the diagnosis of Status post resection of Right total hip arthroplasty and placement of antibiotic spacer.  The various methods of treatment have been discussed with the patient and family. After consideration of risks, benefits and other options for treatment, the patient has consented to  Procedure(s): REIMPLANTATION/REVISION OF RIGHT TOTAL HIP VERSUS REPEAT IRRIGATION AND DEBRIDEMENT AND PLACEMENT OF ANTIBIOTIC SPACER (Right) as a surgical intervention.  The patient's history has been reviewed, patient examined, no change in status, stable for surgery.  I have reviewed the patient's chart and labs.  Questions were answered to the patient's satisfaction.     [name]

## 2020-12-25 NOTE — Plan of Care (Signed)
  Problem: Pain Management: Goal: Pain level will decrease with appropriate interventions Outcome: Progressing   Problem: Nutrition: Goal: Adequate nutrition will be maintained Outcome: Progressing   Problem: Safety: Goal: Ability to remain free from injury will improve Outcome: Progressing   

## 2020-12-25 NOTE — Anesthesia Procedure Notes (Signed)
Procedure Name: Intubation Date/Time: 12/25/2020 12:13 PM Performed by: Eben Burow, CRNA Pre-anesthesia Checklist: Patient identified, Emergency Drugs available, Suction available, Patient being monitored and Timeout performed Patient Re-evaluated:Patient Re-evaluated prior to induction Oxygen Delivery Method: Circle system utilized Preoxygenation: Pre-oxygenation with 100% oxygen Induction Type: IV induction Ventilation: Mask ventilation without difficulty Laryngoscope Size: Mac and 4 Grade View: Grade I Tube type: Oral Tube size: 7.5 mm Number of attempts: 1 Airway Equipment and Method: Stylet Placement Confirmation: ETT inserted through vocal cords under direct vision, positive ETCO2 and breath sounds checked- equal and bilateral Secured at: 23 cm Tube secured with: Tape Dental Injury: Teeth and Oropharynx as per pre-operative assessment

## 2020-12-25 NOTE — Progress Notes (Signed)
Patient informed the surgeon is delayed and will probably go back for surgery in the next 30 minutes. Patient voiced understanding.

## 2020-12-25 NOTE — Anesthesia Postprocedure Evaluation (Signed)
Anesthesia Post Note  Patient: Travis [name]  Procedure(s) Performed: REIMPLANTATION/REVISION OF RIGHT TOTAL HIP VERSUS REPEAT IRRIGATION AND DEBRIDEMENT (Right: Hip)     Patient location during evaluation: PACU Anesthesia Type: General Level of consciousness: awake and alert, patient cooperative and oriented Pain management: pain level controlled Vital Signs Assessment: post-procedure vital signs reviewed and stable Respiratory status: spontaneous breathing, nonlabored ventilation and respiratory function stable Cardiovascular status: blood pressure returned to baseline and stable Postop Assessment: no apparent nausea or vomiting and adequate PO intake Anesthetic complications: no   No notable events documented.  Last Vitals:  Vitals:   12/25/20 1545 12/25/20 1600  BP: 123/71 116/66  Pulse: (!) 51 (!) 48  Resp: 13 13  Temp:    SpO2: 98% 98%    Last Pain:  Vitals:   12/25/20 1545  TempSrc:   PainSc: 7                  Alicia Ackert,E. Abrea Henle

## 2020-12-25 NOTE — Discharge Instructions (Signed)

## 2020-12-25 NOTE — Transfer of Care (Addendum)
Immediate Anesthesia Transfer of Care Note  Patient: Travis [name]  Procedure(s) Performed: REIMPLANTATION/REVISION OF RIGHT TOTAL HIP VERSUS REPEAT IRRIGATION AND DEBRIDEMENT (Right: Hip)  Patient Location: PACU  Anesthesia Type:General  Level of Consciousness: drowsy  Airway & Oxygen Therapy: Patient Spontanous Breathing and Patient connected to face mask oxygen  Post-op Assessment: Report given to RN and Post -op Vital signs reviewed and stable  Post vital signs: Reviewed and stable  Last Vitals:  Vitals Value Taken Time  BP 114/69 12/25/20 1500  Temp 36.2 C 12/25/20 1450  Pulse 52 12/25/20 1503  Resp 15 12/25/20 1503  SpO2 94 % 12/25/20 1503  Vitals shown include unvalidated device data.  Last Pain:  Vitals:   12/25/20 1450  TempSrc:   PainSc: Asleep         Complications: No notable events documented.

## 2020-12-26 LAB — BASIC METABOLIC PANEL
Anion gap: 9 (ref 5–15)
BUN: 18 mg/dL (ref 8–23)
CO2: 27 mmol/L (ref 22–32)
Calcium: 8.7 mg/dL — ABNORMAL LOW (ref 8.9–10.3)
Chloride: 105 mmol/L (ref 98–111)
Creatinine, Ser: 0.95 mg/dL (ref 0.61–1.24)
GFR, Estimated: >60 mL/min (ref >60)
Glucose, Bld: 175 mg/dL — ABNORMAL HIGH (ref 70–99)
Potassium: 4 mmol/L (ref 3.5–5.1)
Sodium: 141 mmol/L (ref 135–145)

## 2020-12-26 LAB — CBC
HCT: 37.9 % — ABNORMAL LOW (ref 39.0–52.0)
Hemoglobin: 12.5 g/dL — ABNORMAL LOW (ref 13.0–17.0)
MCH: 31.4 pg (ref 26.0–34.0)
MCHC: 33 g/dL (ref 30.0–36.0)
MCV: 95.2 fL (ref 80.0–100.0)
Platelets: 205 10*3/uL (ref 150–400)
RBC: 3.98 MIL/uL — ABNORMAL LOW (ref 4.22–5.81)
RDW: 15.9 % — ABNORMAL HIGH (ref 11.5–15.5)
WBC: 14.5 10*3/uL — ABNORMAL HIGH (ref 4.0–10.5)
nRBC: 0 % (ref 0.0–0.2)

## 2020-12-26 MED ORDER — HYDROCODONE-ACETAMINOPHEN 5-325 MG PO TABS: 1.0000 | ORAL_TABLET | Freq: Four times a day (QID) | ORAL | 0 refills | Status: DC | PRN

## 2020-12-26 MED ORDER — DOCUSATE SODIUM 100 MG PO CAPS
100.0000 mg | ORAL_CAPSULE | Freq: Two times a day (BID) | ORAL | 0 refills | Status: DC
Start: 2020-12-26 — End: 2021-12-11

## 2020-12-26 MED ORDER — METHOCARBAMOL 500 MG PO TABS: 500.0000 mg | ORAL_TABLET | Freq: Four times a day (QID) | ORAL | 0 refills | Status: DC | PRN

## 2020-12-26 NOTE — TOC Transition Note (Signed)
Transition of Care [facility]) - CM/SW Discharge Note   Patient Details  Name: JORAN ULIN MRN: [id number] Date of Birth: 10/07/1950  [facility] CM/SW Contact:  Lennart Pall, LCSW Phone Number: 12/26/2020, 2:51 PM   Clinical Narrative:    Spoke briefly with pt and confirming he has all needed DME at home.  Plan for HEP.  No TOC needs.   Final next level of care: Home/Self Care Barriers to Discharge: No Barriers Identified   Patient Goals and CMS Choice Patient states their goals for this hospitalization and ongoing recovery are:: return home      Discharge Placement                       Discharge Plan and Services                DME Arranged: N/A DME Agency: NA                  Social Determinants of Health (SDOH) Interventions     Readmission Risk Interventions No flowsheet data found.

## 2020-12-26 NOTE — Progress Notes (Addendum)
Subjective: 1 Day Post-Op Procedure(s) (LRB): REIMPLANTATION/REVISION OF RIGHT TOTAL HIP VERSUS REPEAT IRRIGATION AND DEBRIDEMENT (Right) Patient reports pain as mild.   Patient seen in rounds with Dr. Alvan Dame. Patient is well, and has had no acute complaints or problems. He has not gotten up with PT yet. Voiding without difficulty.  We will start therapy today.   Objective: Vital signs in last 24 hours: Temp:  [97.2 F (36.2 C)-98.7 F (37.1 C)] 98.7 F (37.1 C) (08/26 0611) Pulse Rate:  [46-61] 61 (08/26 0611) Resp:  [10-20] 18 (08/26 0611) BP: (101-162)/(56-89) 125/62 (08/26 0611) SpO2:  [94 %-100 %] 98 % (08/26 0611) Weight:  [113.6 kg] 113.6 kg (08/25 0835)  Intake/Output from previous day:  Intake/Output Summary (Last 24 hours) at 12/26/2020 0752 Last data filed at 12/26/2020 0600 Gross per 24 hour  Intake 3585.91 ml  Output 1300 ml  Net 2285.91 ml     Intake/Output this shift: No intake/output data recorded.  Labs: Recent Labs    12/26/20 0320  HGB 12.5*   Recent Labs    12/26/20 0320  WBC 14.5*  RBC 3.98*  HCT 37.9*  PLT 205   Recent Labs    12/26/20 0320  NA 141  K 4.0  CL 105  CO2 27  BUN 18  CREATININE 0.95  GLUCOSE 175*  CALCIUM 8.7*   Recent Labs    12/25/20 0858  INR 1.0    Exam: General - Patient is Alert and Oriented Extremity - Neurologically intact Sensation intact distally Intact pulses distally Dorsiflexion/Plantar flexion intact Dressing - dressing C/D/I Motor Function - intact, moving foot and toes well on exam.   Past Medical History:  Diagnosis Date   Arthritis    "comes and goes; mostly in hands, occasionally elbow" (0000000)   Complication of anesthesia    "just wanted to sleep alot  for couple days S/P VARICOCELE OR" (04/05/2017)   DVT (deep venous thrombosis) (HCC)    LLE   Dysrhythmia    PVCs    GERD (gastroesophageal reflux disease)    History of hiatal hernia    Hypertension    Impaired glucose  tolerance    Myeloma ([place])    Obesity (BMI 30-39.9) 07/26/2016   OSA on CPAP 07/26/2016   Mild with AHI 12.5/hr now on CPAP at 14cm H2O   PAF (paroxysmal atrial fibrillation) ([place])    s/p PVI Ablation in 2018 // Flecainide Rx // Echo 8/21: EF 60-65, no RWMA, mild LVH, normal RV SF, moderate RVE, mild BAE, trivial MR, mild dilation of aortic root (40 mm)   Pneumonia    "may have had walking pneumonia a few years ago" (04/05/2017)   Pre-diabetes    Thrombophlebitis     Assessment/Plan: 1 Day Post-Op Procedure(s) (LRB): REIMPLANTATION/REVISION OF RIGHT TOTAL HIP VERSUS REPEAT IRRIGATION AND DEBRIDEMENT (Right) Active Problems:   S/P revision of right total hip  Estimated body mass index is 35.93 kg/m as calculated from the following:   Height as of this encounter: '5\' 10"'$  (1.778 m).   Weight as of this encounter: 113.6 kg. Advance diet Up with therapy  DVT Prophylaxis -  Eliquis PWB RLE 50%, likely for 6-8 weeks Begin therapy Hip precautions discussed with patient  Hgb stable at 12.5 this AM.  No sign of infection intra-operatively, but will send home on Doxycycline 100 mg BID x3 months prophylactically.  Plan is to go Home after hospital stay.  Anticipate he will need at least one more night in  order to progress to safe discharge home based on PWB restrictions and limited assistance at home. Plan for discharge home tomorrow if meeting goals with PT.   Griffith Citron, PA-C Orthopedic Surgery [phone] 12/26/2020, 7:52 AM

## 2020-12-26 NOTE — Progress Notes (Signed)
Physical Therapy Treatment Patient Details Name: Travis [name] MRN: [id number] DOB: Dec 03, 1950 Today's Date: 12/26/2020    History of Present Illness 70 y.o. male with hx of history of primary THA in 2020 by Dr. Creig Hines, underwent right total hip arthroplasty resection with antibiotic spacer placement on 09/26/20 and admitted 12/25/20 for REIMPLANTATION/REVISION OF RIGHT TOTAL HIP VERSUS REPEAT IRRIGATION AND DEBRIDEMENT (Right) . PMHx: PNA, HTN, DVT, PAF, MM    PT Comments    Pt ambulated in hallway good distance and performed a couple standing exercises.  Pt progressing well and anticipates d/c home tomorrow.    Follow Up Recommendations  Follow surgeon's recommendation for DC plan and follow-up therapies (HEP)     Equipment Recommendations  None recommended by PT    Recommendations for Other Services       Precautions / Restrictions Precautions Precautions: Fall;Posterior Hip Precaution Comments: reviewed posterior hip precautions Restrictions Weight Bearing Restrictions: Yes RLE Weight Bearing: Partial weight bearing RLE Partial Weight Bearing Percentage or Pounds: 50%    Mobility  Bed Mobility               General bed mobility comments: pt in recliner on arrival    Transfers Overall transfer level: Needs assistance Equipment used: Rolling walker (2 wheeled) Transfers: Sit to/from Stand Sit to Stand: Min guard;Supervision         General transfer comment: increased time and effort however no physical assist required  Ambulation/Gait Ambulation/Gait assistance: Min guard;Supervision Gait Distance (Feet): 400 Feet Assistive device: Rolling walker (2 wheeled) Gait Pattern/deviations: Step-to pattern;Decreased stance time - right;Antalgic;Step-through pattern     General Gait Details: verbal cues for sequence, RW positioning, posture, and maintaining hip precautions with turning   Stairs             Wheelchair Mobility    Modified Rankin  (Stroke Patients Only)       Balance                                            Cognition Arousal/Alertness: Awake/alert Behavior During Therapy: WFL for tasks assessed/performed Overall Cognitive Status: Within Functional Limits for tasks assessed                                        Exercises Total Joint Exercises  Hip ABduction/ADduction: AROM;Right;10 reps;Standing Knee Flexion: AROM;Right;10 reps;Standing Marching in Standing: AROM;Right;10 reps;Standing (within precautions)    General Comments        Pertinent Vitals/Pain Pain Assessment: 0-10 Pain Score: 2  Pain Location: right hip Pain Descriptors / Indicators: Sore Pain Intervention(s): Repositioned;Monitored during session    Home Living                      Prior Function            PT Goals (current goals can now be found in the care plan section) Acute Rehab PT Goals PT Goal Formulation: With patient Time For Goal Achievement: 01/02/21 Potential to Achieve Goals: Good Progress towards PT goals: Progressing toward goals    Frequency    7X/week      PT Plan Current plan remains appropriate    Co-evaluation              AM-PAC PT "6  Clicks" Mobility   Outcome Measure  Help needed turning from your back to your side while in a flat bed without using bedrails?: A Little Help needed moving from lying on your back to sitting on the side of a flat bed without using bedrails?: A Little Help needed moving to and from a bed to a chair (including a wheelchair)?: A Little Help needed standing up from a chair using your arms (e.g., wheelchair or bedside chair)?: A Little Help needed to walk in hospital room?: A Little Help needed climbing 3-5 steps with a railing? : A Little 6 Click Score: 18    End of Session Equipment Utilized During Treatment: Gait belt Activity Tolerance: Patient tolerated treatment well Patient left: in chair;with call  bell/phone within reach;with chair alarm set   PT Visit Diagnosis: Other abnormalities of gait and mobility (R26.89)     Time: [id number] PT Time Calculation (min) (ACUTE ONLY): 16 min  Charges:  $Gait Training: 8-22 mins                     Arlyce Dice, DPT Acute Rehabilitation Services Pager: [phone] Office: [phone]    York Ram E 12/26/2020, 3:19 PM

## 2020-12-26 NOTE — Brief Op Note (Signed)
12/25/2020  4:26 PM  PATIENT:  Travis [name]  70 y.o. male  PRE-OPERATIVE DIAGNOSIS:  Status post resection of Right total hip arthroplasty and placement of antibiotic spacer  POST-OPERATIVE DIAGNOSIS:  Status post resection of Right total hip arthroplasty and placement of antibiotic spacer  PROCEDURE:  Procedure(s): REIMPLANTATION/REVISION OF RIGHT TOTAL HIP   SURGEON:  Surgeon(s) and Role:    Paralee Cancel, MD - Primary  PHYSICIAN ASSISTANT: Costella Hatcher, PA-C  ANESTHESIA:   general  EBL:  500 mL   BLOOD ADMINISTERED:none  DRAINS: none   LOCAL MEDICATIONS USED:  NONE  SPECIMEN:  No Specimen  DISPOSITION OF SPECIMEN:  N/A  COUNTS:  YES  TOURNIQUET:  * No tourniquets in log *  DICTATION: .Other Dictation: Dictation Number [id number]  PLAN OF CARE: Admit to inpatient   PATIENT DISPOSITION:  PACU - hemodynamically stable.   Delay start of Pharmacological VTE agent (>24hrs) due to surgical blood loss or risk of bleeding: no

## 2020-12-26 NOTE — Evaluation (Signed)
Physical Therapy Evaluation Patient Details Name: Travis [name] MRN: [id number] DOB: 01/03/1951 Today's Date: 12/26/2020   History of Present Illness  70 y.o. male with hx of history of primary THA in 2020 by Dr. Creig Hines, underwent right total hip arthroplasty resection with antibiotic spacer placement on 09/26/20 and admitted 12/25/20 for REIMPLANTATION/REVISION OF RIGHT TOTAL HIP VERSUS REPEAT IRRIGATION AND DEBRIDEMENT (Right) . PMHx: PNA, HTN, DVT, PAF, MM  Clinical Impression  Patient is s/p above surgery resulting in functional limitations due to the deficits listed below (see PT Problem List). Patient will benefit from skilled PT to increase their independence and safety with mobility to allow discharge to the venue listed below.  Pt reports pain much improved compared to previous hip surgeries and he is pleased with his ability to move today.  Reviewed PWB and posterior hip precautions.  Pt also performed LE exercises.  Pt anticipates d/c home tomorrow.     Follow Up Recommendations Follow surgeon's recommendation for DC plan and follow-up therapies    Equipment Recommendations  None recommended by PT    Recommendations for Other Services       Precautions / Restrictions Precautions Precautions: Fall;Posterior Hip Precaution Comments: reviewed posterior hip precautions and provided handout Restrictions Weight Bearing Restrictions: Yes RLE Weight Bearing: Partial weight bearing RLE Partial Weight Bearing Percentage or Pounds: 50%      Mobility  Bed Mobility               General bed mobility comments: pt in recliner on arrival    Transfers Overall transfer level: Needs assistance Equipment used: Rolling walker (2 wheeled) Transfers: Sit to/from Stand Sit to Stand: Min guard         General transfer comment: verbal cues for UE and LE positioning, min/guard for safety, increased time and effort however no physical assist  required  Ambulation/Gait Ambulation/Gait assistance: Min guard Gait Distance (Feet): 240 Feet Assistive device: Rolling walker (2 wheeled) Gait Pattern/deviations: Step-to pattern;Decreased stance time - right;Antalgic     General Gait Details: verbal cues for sequence, RW positioning, posture, and maintaining hip precautions with turning  Stairs            Wheelchair Mobility    Modified Rankin (Stroke Patients Only)       Balance                                             Pertinent Vitals/Pain Pain Assessment: 0-10 Pain Score: 3  Pain Location: right hip Pain Descriptors / Indicators: Sore Pain Intervention(s): Repositioned;Monitored during session    Home Living Family/patient expects to be discharged to:: Private residence Living Arrangements: Spouse/significant other Available Help at Discharge: Family Type of Home: House Home Access: Stairs to enter Entrance Stairs-Rails: Right Entrance Stairs-Number of Steps: 3 Home Layout: Two level;Able to live on main level with bedroom/bathroom Home Equipment: Gilford Rile - 2 wheels;Cane - single point      Prior Function Level of Independence: Independent               Hand Dominance        Extremity/Trunk Assessment        Lower Extremity Assessment Lower Extremity Assessment: RLE deficits/detail RLE Deficits / Details: good quad contraction, able to perform ankle pumps, anticipated post op hip weakness, maintained posterio hip precautions       Communication  Communication: No difficulties  Cognition Arousal/Alertness: Awake/alert Behavior During Therapy: WFL for tasks assessed/performed Overall Cognitive Status: Within Functional Limits for tasks assessed                                        General Comments      Exercises Total Joint Exercises Ankle Circles/Pumps: AROM;Both;10 reps Quad Sets: AROM;Both;10 reps Hip ABduction/ADduction:  AAROM;Right;10 reps Long Arc Quad: AROM;Right;Seated;10 reps   Assessment/Plan    PT Assessment Patient needs continued PT services  PT Problem List Decreased strength;Decreased mobility;Decreased activity tolerance;Decreased balance;Decreased knowledge of use of DME;Decreased knowledge of precautions       PT Treatment Interventions Stair training;Gait training;DME instruction;Therapeutic exercise;Balance training;Functional mobility training;Therapeutic activities;Patient/family education    PT Goals (Current goals can be found in the Care Plan section)  Acute Rehab PT Goals PT Goal Formulation: With patient Time For Goal Achievement: 01/02/21 Potential to Achieve Goals: Good    Frequency 7X/week   Barriers to discharge        Co-evaluation               AM-PAC PT "6 Clicks" Mobility  Outcome Measure Help needed turning from your back to your side while in a flat bed without using bedrails?: A Little Help needed moving from lying on your back to sitting on the side of a flat bed without using bedrails?: A Little Help needed moving to and from a bed to a chair (including a wheelchair)?: A Little Help needed standing up from a chair using your arms (e.g., wheelchair or bedside chair)?: A Little Help needed to walk in hospital room?: A Little Help needed climbing 3-5 steps with a railing? : A Little 6 Click Score: 18    End of Session Equipment Utilized During Treatment: Gait belt Activity Tolerance: Patient tolerated treatment well Patient left: in chair;with call bell/phone within reach;with chair alarm set   PT Visit Diagnosis: Other abnormalities of gait and mobility (R26.89)    Time: [id number] PT Time Calculation (min) (ACUTE ONLY): 22 min   Charges:   PT Evaluation $PT Eval Low Complexity: 1 Low         Kati PT, DPT Acute Rehabilitation Services Pager: [phone] Office: [phone]   Trena Platt 12/26/2020, 12:35 PM

## 2020-12-27 LAB — CBC
HCT: 33.5 % — ABNORMAL LOW (ref 39.0–52.0)
Hemoglobin: 11.4 g/dL — ABNORMAL LOW (ref 13.0–17.0)
MCH: 31.7 pg (ref 26.0–34.0)
MCHC: 34 g/dL (ref 30.0–36.0)
MCV: 93.1 fL (ref 80.0–100.0)
Platelets: 199 10*3/uL (ref 150–400)
RBC: 3.6 MIL/uL — ABNORMAL LOW (ref 4.22–5.81)
RDW: 15.9 % — ABNORMAL HIGH (ref 11.5–15.5)
WBC: 19.2 10*3/uL — ABNORMAL HIGH (ref 4.0–10.5)
nRBC: 0 % (ref 0.0–0.2)

## 2020-12-27 NOTE — Op Note (Signed)
NAME: Travis [name], Travis [name] MEDICAL RECORD NO: [id number] ACCOUNT NO: [id number] DATE OF BIRTH: 07-21-1950 FACILITY: [id number] PHYSICIAN: Pietro Cassis. 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  PROCEDURE PERFORMED: 1.  Resection of previously placed articulating antibiotic spacer. 2.  Reimplantation of right total hip arthroplasty.  COMPONENTS USED:  DePuy hip system with a size 58 shell with a 36+4 neutral AltrX liner and 8 high ACTIS stem with a 36+5 ball.  SURGEON:  Pietro Cassis. Alvan Dame, MD  ASSISTANT:  Costella Hatcher, PA-C.  Note that Ms. Lu Duffel was present for the entirety of the case for preoperative positioning, perioperative management of the operative extremity, general facilitation of the case, and primary wound closure.  ANESTHESIA:  General.  SPECIMENS:  None.  DRAINS:  None.  COMPLICATIONS:  None.  BLOOD LOSS:  About 500 mL  INDICATIONS FOR PROCEDURE:  The patient is a 70 year old male who was initially seen and evaluated for a second opinion evaluation of his right hip following total hip arthroplasty.  My initial workup raised concerns for infection.  This led to resection  of his total hip replacement with placement of an antibiotic spacer.  He was treated with IV antibiotics for 6 weeks and after a period of time  laboratory studies indicated improvement of his overall lab studies without concern for recurrent infection.   An aspiration of his hip joint prior to surgery had indicated no evidence of infection thorough Gram stain.  At this point, he was scheduled for reimplantation.  Risk of recurrent infection discussed.   Consent was obtained for benefit of pain relief  and improved function after reviewing standard risk for recurrent infection, DVT, dislocation, neurovascular injury, and need for future surgeries.  PROCEDURE IN DETAIL:  The patient was brought to operative theater.  Once adequate anesthesia and preoperative antibiotics administered, he was positioned into the left lateral decubitus position with his right hip up.  The right lower extremity was  prepped and draped in sterile fashion.  A timeout was performed identifying the patient, planned procedure, and extremity.  His old incision was excised.  Soft tissue planes created and then went through the posterior gluteus and iliotibial band,  exposing the hip.  There were no signs of infection, no purulence and no other signs of inflammation.  I proceeded by exposing the posterior aspect of the hip.  We then dislocated the artificial articulating spacer.  We got the ball and liner removed.   The femoral stem came out without difficulty.  Time was spent at this point removing all the cement from the proximal femur as well as around the acetabulum.  Once this was done, I prepared the acetabulum by re-reaming.  I reamed up to 57 mm and selected  a 58 mm Pinnacle cup.  This was impacted and 2 screws placed into the ilium.  Following placement of a trial liner, given the revision setting, I prepared the proximal femur.  I did have to use the [facility] drills available to drill through a pedestal.   I then reamed to remove any sclerotic bone as well as fibrous tissue.  I then elected to go with a primary based proximal loading stem.  I broached up to a  size 7 initially and following a trial reduction, re-broached to an 8.  I then selected the +5  ball.  With this, I felt his leg lengths were restored.  His hip was stable.  I felt that his combined anteversion was over 50 degrees.  There was no evidence of impingement or subluxation with his construct.  Given these  findings, the trial components  were removed.  The final 36+4 neutral AltrX liner was impacted with good visualized rim fit.  The final 8 high ACTIS stem was then impacted.  Based on the trial reductions and the position of the final stem, the 36+5 delta ceramic ball was then impacted  on the clean and dry trunnion.  The hip was reduced.  The hip was irrigated throughout the case and again at this point.  Given the amount of dissection, there is no posterior capsular repair.  The iliotibial band and gluteal fascia were reapproximated  using #1 Vicryl, #1 Stratafix suture.  The remainder of the wound was closed with 2-0 Vicryl and a running Monocryl stitch.  The hip was then cleaned, dried and dressed sterilely using surgical glue and Aquacel dressing.  The patient was then extubated  and brought to the recovery room in stable condition, tolerated the procedure well.  Findings were reviewed with his wife.  Postoperatively, he will have to be partial weightbearing for probably 6-8 weeks.  We will see him back in the office to review findings.  He will be on suppressive antibiotics for up to 3 months if tolerable.   VAI D: 12/26/2020 4:45:15 pm T: 12/27/2020 12:20:00 am  JOB: [id number]

## 2020-12-27 NOTE — Progress Notes (Signed)
Orthopedics Progress Note  Subjective: Patient feeling better today and ready for discharge home  Objective:  Vitals:   12/26/20 2114 12/27/20 0558  BP: 112/63 124/69  Pulse: 62 (!) 57  Resp: 18 18  Temp: 98 F (36.7 C) 97.9 F (36.6 C)  SpO2: 95% 96%    General: Awake and alert  Musculoskeletal: Right hip aquacel dressing CDI, moderate swelling Neurovascularly intact  Lab Results  Component Value Date   WBC 19.2 (H) 12/27/2020   HGB 11.4 (L) 12/27/2020   HCT 33.5 (L) 12/27/2020   MCV 93.1 12/27/2020   PLT 199 12/27/2020       Component Value Date/Time   NA 141 12/26/2020 0320   NA 140 12/04/2019 1110   K 4.0 12/26/2020 0320   CL 105 12/26/2020 0320   CO2 27 12/26/2020 0320   GLUCOSE 175 (H) 12/26/2020 0320   BUN 18 12/26/2020 0320   BUN 11 12/04/2019 1110   CREATININE 0.95 12/26/2020 0320   CREATININE 0.81 10/16/2020 1453   CALCIUM 8.7 (L) 12/26/2020 0320   GFRNONAA >60 12/26/2020 0320   GFRNONAA >60 05/05/2020 0859   GFRAA 108 12/04/2019 1110   GFRAA >60 07/25/2019 1403    Lab Results  Component Value Date   INR 1.0 12/25/2020   INR 1.1 09/18/2020   INR 1.16 07/12/2017    Assessment/Plan: POD #2 s/p Procedure(s): REIMPLANTATION/REVISION OF RIGHT TOTAL HIP VERSUS REPEAT IRRIGATION AND DEBRIDEMENT Discharge to home on PO abx. Follow up in two weeks with Dr Diona Fanti R. Veverly Fells, MD 12/27/2020 8:05 AM

## 2020-12-27 NOTE — Discharge Summary (Signed)
In most cases prophylactic antibiotics for Dental procdeures after total joint surgery are not necessary.  Exceptions are as follows:  1. History of prior total joint infection  2. Severely immunocompromised (Organ Transplant, cancer chemotherapy, Rheumatoid biologic meds such as [place])  3. Poorly controlled diabetes (A1C &gt; 8.0, blood glucose over 200)  If you have one of these conditions, contact your surgeon for an antibiotic prescription, prior to your dental procedure. Orthopedic Discharge Summary        Physician Discharge Summary  Patient ID: Travis [name] MRN: [id number] DOB/AGE: 13-Mar-1951 70 y.o.  Admit date: 12/25/2020 Discharge date: 12/27/2020   Procedures:  Procedure(s) (LRB): REIMPLANTATION/REVISION OF RIGHT TOTAL HIP VERSUS REPEAT IRRIGATION AND DEBRIDEMENT (Right)  Attending Physician:  Dr. Paralee Cancel   Admission Diagnoses:   Right hip total hip failure  Discharge Diagnoses:  same   Past Medical History:  Diagnosis Date   Arthritis    "comes and goes; mostly in hands, occasionally elbow" (0000000)   Complication of anesthesia    "just wanted to sleep alot  for couple days S/P VARICOCELE OR" (04/05/2017)   DVT (deep venous thrombosis) (HCC)    LLE   Dysrhythmia    PVCs    GERD (gastroesophageal reflux disease)    History of hiatal hernia    Hypertension    Impaired glucose tolerance    Myeloma ([place])    Obesity (BMI 30-39.9) 07/26/2016   OSA on CPAP 07/26/2016   Mild with AHI 12.5/hr now on CPAP at 14cm H2O   PAF (paroxysmal atrial fibrillation) ([place])    s/p PVI Ablation in 2018 // Flecainide Rx // Echo 8/21: EF 60-65, no RWMA, mild LVH, normal RV SF, moderate RVE, mild BAE, trivial MR, mild dilation of aortic root (40 mm)   Pneumonia    "may have had walking pneumonia a few years ago" (04/05/2017)   Pre-diabetes    Thrombophlebitis     PCP: Lajean Manes, MD   Discharged Condition: stable  Hospital Course:  Patient  underwent the above stated procedure on 12/25/2020. Patient tolerated the procedure well and brought to the recovery room in good condition and subsequently to the floor. Patient had an uncomplicated hospital course and was stable for discharge. Patient will remain on supressive antibiotics for three months   Disposition: Discharge disposition: 01-Home or Self Care      with follow up in 2 weeks    Follow-up Information     Paralee Cancel, MD. Schedule an appointment as soon as possible for a visit in 2 week(s).   Specialty: Orthopedic Surgery Contact information: [address] [place] [address] [id number]                 Dental Antibiotics:  In most cases prophylactic antibiotics for Dental procdeures after total joint surgery are not necessary.  Exceptions are as follows:  1. History of prior total joint infection  2. Severely immunocompromised (Organ Transplant, cancer chemotherapy, Rheumatoid biologic meds such as [place])  3. Poorly controlled diabetes (A1C &gt; 8.0, blood glucose over 200)  If you have one of these conditions, contact your surgeon for an antibiotic prescription, prior to your dental procedure.  Discharge Instructions     Call MD / Call 911   Complete by: As directed    If you experience chest pain or shortness of breath, CALL 911 and be transported to the hospital emergency room.  If you develope a fever above 101 F, pus (white drainage)  or increased drainage or redness at the wound, or calf pain, call your surgeon's office.   Call MD / Call 911   Complete by: As directed    If you experience chest pain or shortness of breath, CALL 911 and be transported to the hospital emergency room.  If you develope a fever above 101 F, pus (white drainage) or increased drainage or redness at the wound, or calf pain, call your surgeon's office.   Change dressing   Complete by: As directed    Maintain surgical dressing until follow up  in the clinic. If the edges start to pull up, may reinforce with tape. If the dressing is no longer working, may remove and cover with gauze and tape, but must keep the area dry and clean.  Call with any questions or concerns.   Constipation Prevention   Complete by: As directed    Drink plenty of fluids.  Prune juice may be helpful.  You may use a stool softener, such as Colace (over the counter) 100 mg twice a day.  Use MiraLax (over the counter) for constipation as needed.   Constipation Prevention   Complete by: As directed    Drink plenty of fluids.  Prune juice may be helpful.  You may use a stool softener, such as Colace (over the counter) 100 mg twice a day.  Use MiraLax (over the counter) for constipation as needed.   Diet - low sodium heart healthy   Complete by: As directed    Diet - low sodium heart healthy   Complete by: As directed    Follow the hip precautions as taught in Physical Therapy   Complete by: As directed    Increase activity slowly as tolerated   Complete by: As directed    Partial weight bearing with assist device as directed.   Increase activity slowly as tolerated   Complete by: As directed    Post-operative opioid taper instructions:   Complete by: As directed    POST-OPERATIVE OPIOID TAPER INSTRUCTIONS: It is important to wean off of your opioid medication as soon as possible. If you do not need pain medication after your surgery it is ok to stop day one. Opioids include: Codeine, Hydrocodone(Norco, Vicodin), Oxycodone(Percocet, oxycontin) and hydromorphone amongst others.  Long term and even short term use of opiods can cause: Increased pain response Dependence Constipation Depression Respiratory depression And more.  Withdrawal symptoms can include Flu like symptoms Nausea, vomiting And more Techniques to manage these symptoms Hydrate well Eat regular healthy meals Stay active Use relaxation techniques(deep breathing, meditating, yoga) Do Not  substitute Alcohol to help with tapering If you have been on opioids for less than two weeks and do not have pain than it is ok to stop all together.  Plan to wean off of opioids This plan should start within one week post op of your joint replacement. Maintain the same interval or time between taking each dose and first decrease the dose.  Cut the total daily intake of opioids by one tablet each day Next start to increase the time between doses. The last dose that should be eliminated is the evening dose.      Post-operative opioid taper instructions:   Complete by: As directed    POST-OPERATIVE OPIOID TAPER INSTRUCTIONS: It is important to wean off of your opioid medication as soon as possible. If you do not need pain medication after your surgery it is ok to stop day one. Opioids include: Codeine, Hydrocodone(Norco,  Vicodin), Oxycodone(Percocet, oxycontin) and hydromorphone amongst others.  Long term and even short term use of opiods can cause: Increased pain response Dependence Constipation Depression Respiratory depression And more.  Withdrawal symptoms can include Flu like symptoms Nausea, vomiting And more Techniques to manage these symptoms Hydrate well Eat regular healthy meals Stay active Use relaxation techniques(deep breathing, meditating, yoga) Do Not substitute Alcohol to help with tapering If you have been on opioids for less than two weeks and do not have pain than it is ok to stop all together.  Plan to wean off of opioids This plan should start within one week post op of your joint replacement. Maintain the same interval or time between taking each dose and first decrease the dose.  Cut the total daily intake of opioids by one tablet each day Next start to increase the time between doses. The last dose that should be eliminated is the evening dose.      TED hose   Complete by: As directed    Use stockings (TED hose) for 2 weeks on both leg(s).  You may  remove them at night for sleeping.       Allergies as of 12/27/2020       Reactions   Vancomycin Rash        Medication List     STOP taking these medications    ertapenem 1,000 mg in sodium chloride 0.9 % 100 mL   linezolid 600 MG tablet Commonly known as: ZYVOX       TAKE these medications    amLODipine 10 MG tablet Commonly known as: NORVASC Take 10 mg by mouth daily.   apixaban 5 MG Tabs tablet Commonly known as: Eliquis Take 1 tablet (5 mg total) by mouth 2 (two) times daily.   b complex vitamins tablet Take 1 tablet by mouth daily.   cholecalciferol 25 MCG (1000 UNIT) tablet Commonly known as: VITAMIN D3 Take 1,000 Units by mouth daily.   diltiazem 30 MG tablet Commonly known as: CARDIZEM Take 1 Tablet Every 4 Hours As Needed For Afib HR Over 100. If using frequently please call our office [phone]   docusate sodium 100 MG capsule Commonly known as: COLACE Take 1 capsule (100 mg total) by mouth 2 (two) times daily.   flecainide 50 MG tablet Commonly known as: TAMBOCOR Take 1.5 tablets (75 mg total) by mouth 2 (two) times daily.   fluticasone 50 MCG/ACT nasal spray Commonly known as: FLONASE Place 1 spray into both nostrils daily as needed for allergies or rhinitis.   folic acid 1 MG tablet Commonly known as: FOLVITE Take 1 mg by mouth daily.   furosemide 40 MG tablet Commonly known as: Lasix Take 1 tablet (40 mg total) by mouth every other day.   gabapentin 300 MG capsule Commonly known as: NEURONTIN TAKE TWO CAPSULES BY MOUTH TWICE A DAY   hydrochlorothiazide 25 MG tablet Commonly known as: HYDRODIURIL Take 1 tablet (25 mg total) by mouth daily.   HYDROcodone-acetaminophen 5-325 MG tablet Commonly known as: NORCO/VICODIN Take 1-2 tablets by mouth every 6 (six) hours as needed for moderate pain or severe pain.   lisinopril 40 MG tablet Commonly known as: ZESTRIL TAKE ONE TABLET BY MOUTH ONE TIME DAILY   methocarbamol 500 MG  tablet Commonly known as: ROBAXIN Take 1 tablet (500 mg total) by mouth every 6 (six) hours as needed for muscle spasms.   metoprolol succinate 100 MG 24 hr tablet Commonly known as: TOPROL-XL Take 100 mg  by mouth daily.   oxymetazoline 0.05 % nasal spray Commonly known as: AFRIN Place 1 spray into both nostrils 2 (two) times daily as needed for congestion.   polyethylene glycol 17 g packet Commonly known as: MIRALAX / GLYCOLAX Take 17 g by mouth daily as needed for mild constipation.   potassium chloride SA 20 MEQ tablet Commonly known as: KLOR-CON Take 1 tablet (20 mEq total) by mouth daily. Take 40 mg on the Days that you take Lasix   sodium chloride 0.65 % Soln nasal spray Commonly known as: OCEAN Place 1 spray into both nostrils as needed for congestion.   triamcinolone cream 0.1 % Commonly known as: KENALOG Apply 1 application topically daily as needed (itching).               Discharge Care Instructions  (From admission, onward)           Start     Ordered   12/27/20 0000  Change dressing       Comments: Maintain surgical dressing until follow up in the clinic. If the edges start to pull up, may reinforce with tape. If the dressing is no longer working, may remove and cover with gauze and tape, but must keep the area dry and clean.  Call with any questions or concerns.   12/27/20 [id number]              Signed: Augustin Schooling 12/27/2020, 8:14 AM  [place] [facility] now [facility] [facility] [address] [place], [place], [place] [address] Phone: [place]

## 2020-12-27 NOTE — Progress Notes (Signed)
Provided discharge education/instructions, all questions and concerns addressed, Pt not in acute distress, discharged home with belongings accompanied by wife.

## 2020-12-27 NOTE — Progress Notes (Signed)
Physical Therapy Treatment Patient Details Name: Travis [name] MRN: [id number] DOB: [birth year] Today's Date: 12/27/2020    History of Present Illness 70 y.o. male with hx of history of primary THA in 2020 by Dr. Creig Hines, underwent right total hip arthroplasty resection with antibiotic spacer placement on 09/26/20 and admitted 12/25/20 for REIMPLANTATION/REVISION OF RIGHT TOTAL HIP VERSUS REPEAT IRRIGATION AND DEBRIDEMENT (Right) . PMHx: PNA, HTN, DVT, PAF, MM    PT Comments    POD # 2 am session General Comments: AxO x 3 very knowledgable as this is his 3rd hip surgery.  Still works "some" as a Chief Executive Officer. General bed mobility comments: demonstarted and inatructed how to use belt to self assist LE also practiced getting in/out of a HIGH bed using one step General transfer comment: with increased time and good use of hands to steady self as well as tech to adhere to his THP. General Gait Details: decreased amb distance this session to focus on stairs, HEP and getting in/out High bed.  Pt aware he is PWB and as long as he is PWB to use the walker. Then returned to room to perform some TE's following HEP handout.  Instructed on proper tech, freq as well as use of ICE.   Addressed all mobility questions, discussed appropriate activity, educated on use of ICE.  Pt ready for D/C to home.   Follow Up Recommendations  Follow surgeon's recommendation for DC plan and follow-up therapies (HEP)     Equipment Recommendations  None recommended by PT    Recommendations for Other Services       Precautions / Restrictions Precautions Precautions: Fall;Posterior Hip Precaution Comments: reviewed posterior hip precautions and issued handout Restrictions Weight Bearing Restrictions: Yes RLE Weight Bearing: Partial weight bearing RLE Partial Weight Bearing Percentage or Pounds: 50%    Mobility  Bed Mobility Overal bed mobility: Needs Assistance Bed Mobility: Supine to Sit;Sit to Supine      Supine to sit: Supervision Sit to supine: Supervision   General bed mobility comments: demonstarted and inatructed how to use belt to self assist LE also practiced getting in/out of a HIGH bed using one step    Transfers Overall transfer level: Needs assistance Equipment used: Rolling walker (2 wheeled) Transfers: Sit to/from [facility] Sit to Stand: Supervision Stand pivot transfers: Supervision       General transfer comment: with increased time and good use of hands to steady self as well as tech to adhere to his THP.  Ambulation/Gait Ambulation/Gait assistance: Supervision Gait Distance (Feet): 22 Feet Assistive device: Rolling walker (2 wheeled) Gait Pattern/deviations: Step-to pattern;Decreased stance time - right;Antalgic;Step-through pattern Gait velocity: decreased   General Gait Details: decreased amb distance this session to focus on stairs, HEP and getting in/out High bed.  Pt aware he is PWB and as long as he is PWB to use the walker.   Stairs Stairs: Yes Stairs assistance: Supervision Stair Management: One rail Right;With crutches Number of Stairs: 3 General stair comments: because pt is PWB and 240 lbs, issued him ONE crutch to take home for stair use only.  Practiced 3 steps using one rail and opposite crutch.  Pt did well with <25% VC's on proper tech.   Wheelchair Mobility    Modified Rankin (Stroke Patients Only)       Balance  Cognition Arousal/Alertness: Awake/alert Behavior During Therapy: WFL for tasks assessed/performed Overall Cognitive Status: Within Functional Limits for tasks assessed                                 General Comments: AxO x 3 very knowledgable as this is his 3rd hip surgery.  Still works "some" as a Chief Executive Officer.      Exercises  Total Hip Replacement TE's following HEP Handout 10 reps ankle pumps 05 reps knee presses 05 reps heel  slides 05 reps SAQ's 05 reps ABD 05 reps all standing TE's  Instructed how to use a belt loop to assist  Followed by ICE     General Comments        Pertinent Vitals/Pain Pain Assessment: 0-10 Pain Score: 5  Pain Location: right hip Pain Descriptors / Indicators: Sore;Discomfort Pain Intervention(s): Monitored during session;Premedicated before session;Repositioned;Ice applied    Home Living                      Prior Function            PT Goals (current goals can now be found in the care plan section) Progress towards PT goals: Progressing toward goals    Frequency    7X/week      PT Plan Current plan remains appropriate    Co-evaluation              AM-PAC PT "6 Clicks" Mobility   Outcome Measure  Help needed turning from your back to your side while in a flat bed without using bedrails?: A Little Help needed moving from lying on your back to sitting on the side of a flat bed without using bedrails?: A Little Help needed moving to and from a bed to a chair (including a wheelchair)?: A Little Help needed standing up from a chair using your arms (e.g., wheelchair or bedside chair)?: A Little Help needed to walk in hospital room?: A Little Help needed climbing 3-5 steps with a railing? : A Little 6 Click Score: 18    End of Session Equipment Utilized During Treatment: Gait belt Activity Tolerance: Patient tolerated treatment well Patient left: in bed;with call bell/phone within reach Nurse Communication: Mobility status (pt ready for D/C to home) PT Visit Diagnosis: Other abnormalities of gait and mobility (R26.89)     Time: 1035-1105 PT Time Calculation (min) (ACUTE ONLY): 30 min  Charges:  $Gait Training: 8-22 mins $Therapeutic Exercise: 8-22 mins                     Rica Koyanagi  PTA Acute  Rehabilitation Services Pager      [phone] Office      [phone]

## 2020-12-29 ENCOUNTER — Encounter (HOSPITAL_COMMUNITY): Payer: Self-pay | Admitting: Orthopedic Surgery

## 2021-01-12 ENCOUNTER — Other Ambulatory Visit: Payer: Self-pay

## 2021-01-12 ENCOUNTER — Encounter: Payer: Medicare Other | Attending: Geriatric Medicine | Admitting: Dietician

## 2021-01-12 DIAGNOSIS — R7303 Prediabetes: Secondary | ICD-10-CM | POA: Diagnosis not present

## 2021-01-12 NOTE — Progress Notes (Signed)
On 01/12/2021 patient completed a post core session of the Diabetes Prevention Program virtually with [facility]. By the end of this session patients are able to complete the following objectives:   Virtual Visit via Video Note  I connected with Satira Anis. [place], 11-Aug-1950 by a video enabled application and verified that I am speaking with the correct person using two identifiers.  Location: Patient: Virtual Provider: Office  Learning Objectives: List indoor physical activity options.  Identify any barriers to being active and brainstorm how to overcome barriers.  Describe short and long-term health benefits of physical activity.   Goals:  Record weight taken outside of class.  Track foods and beverages eaten each day in the "Food and Activity Tracker," including calories and fat grams for each item.   Track activity type, minutes you were active, and distance you reached each day in the "Food and Activity Tracker."   Follow-Up Plan: Attend next session.  Email completed "Food and Activity Trackers" before next session to be reviewed by Lifestyle Coach.

## 2021-01-14 DIAGNOSIS — I10 Essential (primary) hypertension: Secondary | ICD-10-CM | POA: Diagnosis not present

## 2021-01-14 DIAGNOSIS — K760 Fatty (change of) liver, not elsewhere classified: Secondary | ICD-10-CM | POA: Diagnosis not present

## 2021-01-14 DIAGNOSIS — Z0000 Encounter for general adult medical examination without abnormal findings: Secondary | ICD-10-CM | POA: Diagnosis not present

## 2021-01-14 DIAGNOSIS — R7303 Prediabetes: Secondary | ICD-10-CM | POA: Diagnosis not present

## 2021-01-14 DIAGNOSIS — I480 Paroxysmal atrial fibrillation: Secondary | ICD-10-CM | POA: Diagnosis not present

## 2021-01-14 DIAGNOSIS — Z23 Encounter for immunization: Secondary | ICD-10-CM | POA: Diagnosis not present

## 2021-01-14 DIAGNOSIS — G4730 Sleep apnea, unspecified: Secondary | ICD-10-CM | POA: Diagnosis not present

## 2021-01-14 DIAGNOSIS — C9000 Multiple myeloma not having achieved remission: Secondary | ICD-10-CM | POA: Diagnosis not present

## 2021-01-14 DIAGNOSIS — E7800 Pure hypercholesterolemia, unspecified: Secondary | ICD-10-CM | POA: Diagnosis not present

## 2021-01-14 DIAGNOSIS — Z79899 Other long term (current) drug therapy: Secondary | ICD-10-CM | POA: Diagnosis not present

## 2021-01-20 NOTE — Telephone Encounter (Signed)
Replied to patient's message and called Dr. Carlyle Lipa office for recent visit notes/labs. Spoke with medical records. Last labs and ov note are being faxed to [phone] (from 01/14/21)

## 2021-01-29 DIAGNOSIS — G4733 Obstructive sleep apnea (adult) (pediatric): Secondary | ICD-10-CM | POA: Diagnosis not present

## 2021-02-04 DIAGNOSIS — Z4789 Encounter for other orthopedic aftercare: Secondary | ICD-10-CM | POA: Diagnosis not present

## 2021-02-11 DIAGNOSIS — M2550 Pain in unspecified joint: Secondary | ICD-10-CM | POA: Diagnosis not present

## 2021-02-11 DIAGNOSIS — M0609 Rheumatoid arthritis without rheumatoid factor, multiple sites: Secondary | ICD-10-CM | POA: Diagnosis not present

## 2021-02-11 DIAGNOSIS — D472 Monoclonal gammopathy: Secondary | ICD-10-CM | POA: Diagnosis not present

## 2021-02-11 DIAGNOSIS — M65322 Trigger finger, left index finger: Secondary | ICD-10-CM | POA: Diagnosis not present

## 2021-02-11 DIAGNOSIS — Z79899 Other long term (current) drug therapy: Secondary | ICD-10-CM | POA: Diagnosis not present

## 2021-02-13 ENCOUNTER — Other Ambulatory Visit: Payer: Self-pay | Admitting: Internal Medicine

## 2021-02-13 NOTE — Telephone Encounter (Signed)
Eliquis 5mg  refill request received. Patient is 70 years old, weight-113.6kg, Crea-0.95 on 12/26/2020, Diagnosis-Afib, and last seen by [name] on 12/26/2020. Dose is appropriate based on dosing criteria. Will send in refill to requested pharmacy.

## 2021-02-16 ENCOUNTER — Encounter: Payer: Medicare Other | Attending: Geriatric Medicine | Admitting: Dietician

## 2021-02-16 ENCOUNTER — Other Ambulatory Visit: Payer: Self-pay

## 2021-02-16 ENCOUNTER — Ambulatory Visit (INDEPENDENT_AMBULATORY_CARE_PROVIDER_SITE_OTHER): Payer: Medicare Other

## 2021-02-16 DIAGNOSIS — R7303 Prediabetes: Secondary | ICD-10-CM | POA: Insufficient documentation

## 2021-02-16 DIAGNOSIS — R002 Palpitations: Secondary | ICD-10-CM

## 2021-02-16 DIAGNOSIS — I480 Paroxysmal atrial fibrillation: Secondary | ICD-10-CM

## 2021-02-16 NOTE — Progress Notes (Unsigned)
Patient enrolled for [employer] to mail a 14 day ZIO XT monitor to his address on file.

## 2021-02-16 NOTE — Telephone Encounter (Signed)
Please set patient up for 2 wk monitor   Feels PVCs  On flecanide

## 2021-02-16 NOTE — Progress Notes (Signed)
On 02/16/2021 patient completed a post core session of the Diabetes Prevention Program course virtually with [facility]. By the end of this session patients are able to complete the following objectives:   Virtual Visit via Video Note  I connected with Travis [name], 03/31/1951 by a video enabled application and verified that I am speaking with the correct person using two identifiers.  Location: Patient: Virtual Provider: Office  Learning Objectives: Identify different types of stressors List effects of stress on health and healthy lifestyle choices Discuss how stress affects lifestyle choices  Name healthy ways to manage and avoid stressors   Goals:  Record weight taken outside of class.  Track foods and beverages eaten each day in the "Food and Activity Tracker," including calories and fat grams for each item.   Track activity type, minutes you were active, and distance you reached each day in the "Food and Activity Tracker."   Follow-Up Plan: Attend next session.  Email completed "Food and Activity Trackers" before next session to be reviewed by Lifestyle Coach.

## 2021-02-19 DIAGNOSIS — I480 Paroxysmal atrial fibrillation: Secondary | ICD-10-CM

## 2021-02-19 DIAGNOSIS — R002 Palpitations: Secondary | ICD-10-CM | POA: Diagnosis not present

## 2021-02-20 DIAGNOSIS — M19072 Primary osteoarthritis, left ankle and foot: Secondary | ICD-10-CM | POA: Diagnosis not present

## 2021-02-23 DIAGNOSIS — H35372 Puckering of macula, left eye: Secondary | ICD-10-CM | POA: Diagnosis not present

## 2021-02-23 DIAGNOSIS — H5213 Myopia, bilateral: Secondary | ICD-10-CM | POA: Diagnosis not present

## 2021-02-23 DIAGNOSIS — H2513 Age-related nuclear cataract, bilateral: Secondary | ICD-10-CM | POA: Diagnosis not present

## 2021-02-24 ENCOUNTER — Telehealth: Payer: Self-pay | Admitting: Pharmacist

## 2021-02-24 DIAGNOSIS — M25551 Pain in right hip: Secondary | ICD-10-CM | POA: Diagnosis not present

## 2021-02-24 NOTE — Telephone Encounter (Signed)
Patient called with questions regarding his oral antibiotics. Patient was a previous patient of ours, last seen on 7/6 by Janene [name], NP for prosthetic hip infection. Patient has had complicated course and was initially started on vancomycin and ceftriaxone. He developed a rash and was eventually changed to PO linezolid and ertapenem. He completed these on July 8th.   He recently had a resection of his hip with Dr. Alvan Dame in August, who, per patient, restarted him on linezolid for unknown duration (note states at least 3 months). Patient calls today with side effects from chronic linezolid. States he is having burning sensation in his tongue and numbness. He does not want to take it anymore and is asking for other options. He states that Dr. Alvan Dame said to reach out to [place]. I told patient that I would reach out to Dr. Baxter Flattery and Colletta [place] to see if he needs to follow up with [place] or what other advice we can offer him since he hasn't been seen by [place] in 4 months.   Travis [name], PharmD [facility] Clinical Pharmacist Practitioner

## 2021-02-25 NOTE — Telephone Encounter (Signed)
I called to talk with Travis [name] about the concerns he mentioned to Travis [name] on the phone yesterday  The worst thing is his tongue - when he brushes his teeth or has anything spicy or minty flavored really burns a lot. He has also noticed having increased sensitivity to certain adhesives where he gets burns (recently electrode leads from cardiac monitor).   He has noticed no vision changes (specifically no blurry vision or altered color perception) Vision is actually better from what a recent vision exam showed. No issues with peripheral neuropathy.    He had first physical therapy session yesterday. He states he really feels like the infection is gone from his hip. General muscle soreness noted but incision is well healed and he is doing great. He would prefer to not be on antibiotics if possible.   Since it sounds like he is having a possible reaction to linezolid will have him stop it for now and come in to see me Friday 10-28 @ 10:30. I will give Dr. Alvan Dame a call to discuss as well before Travis [name]'s appointment with me.    Travis [name], MSN, NP-C [facility] [email or web address] Pager: [phone] Office: [phone] [place]: [phone]

## 2021-02-25 NOTE — Telephone Encounter (Signed)
Thank you, Stephanie!

## 2021-02-27 ENCOUNTER — Ambulatory Visit (INDEPENDENT_AMBULATORY_CARE_PROVIDER_SITE_OTHER): Payer: Medicare Other

## 2021-02-27 ENCOUNTER — Ambulatory Visit (INDEPENDENT_AMBULATORY_CARE_PROVIDER_SITE_OTHER): Payer: Medicare Other | Admitting: Infectious Diseases

## 2021-02-27 ENCOUNTER — Encounter: Payer: Self-pay | Admitting: Infectious Diseases

## 2021-02-27 ENCOUNTER — Other Ambulatory Visit: Payer: Self-pay

## 2021-02-27 VITALS — BP 147/78 | HR 52 | Temp 98.3°F | Resp 16 | Ht 70.0 in | Wt 225.4 lb

## 2021-02-27 DIAGNOSIS — Z96649 Presence of unspecified artificial hip joint: Secondary | ICD-10-CM | POA: Diagnosis not present

## 2021-02-27 DIAGNOSIS — Z5181 Encounter for therapeutic drug level monitoring: Secondary | ICD-10-CM | POA: Diagnosis not present

## 2021-02-27 DIAGNOSIS — Z23 Encounter for immunization: Secondary | ICD-10-CM | POA: Diagnosis not present

## 2021-02-27 LAB — CBC WITH DIFFERENTIAL/PLATELET
Absolute Monocytes: 1264 cells/uL — ABNORMAL HIGH (ref 200–950)
Basophils Absolute: 43 cells/uL (ref 0–200)
Basophils Relative: 0.5 %
Eosinophils Absolute: 241 cells/uL (ref 15–500)
Eosinophils Relative: 2.8 %
HCT: 36.8 % — ABNORMAL LOW (ref 38.5–50.0)
Hemoglobin: 12.3 g/dL — ABNORMAL LOW (ref 13.2–17.1)
Lymphs Abs: 1264 cells/uL (ref 850–3900)
MCH: 32.7 pg (ref 27.0–33.0)
MCHC: 33.4 g/dL (ref 32.0–36.0)
MCV: 97.9 fL (ref 80.0–100.0)
MPV: 10.3 fL (ref 7.5–12.5)
Monocytes Relative: 14.7 %
Neutro Abs: 5788 cells/uL (ref 1500–7800)
Neutrophils Relative %: 67.3 %
Platelets: 220 10*3/uL (ref 140–400)
RBC: 3.76 10*6/uL — ABNORMAL LOW (ref 4.20–5.80)
RDW: 19.8 % — ABNORMAL HIGH (ref 11.0–15.0)
Total Lymphocyte: 14.7 %
WBC: 8.6 10*3/uL (ref 3.8–10.8)

## 2021-02-27 MED ORDER — DOXYCYCLINE HYCLATE 100 MG PO TABS: 100.0000 mg | ORAL_TABLET | Freq: Two times a day (BID) | ORAL | 0 refills | Status: DC

## 2021-02-27 NOTE — Progress Notes (Signed)
   Covid-19 Vaccination Clinic  Name:  Travis [name]    MRN: [id number] DOB: [birth year]  02/27/2021  Travis [name] was observed post Covid-19 immunization for 15 minutes without incident. He was provided with Vaccine Information Sheet and instruction to access the V-Safe system.   Travis [name] was instructed to call 911 with any severe reactions post vaccine: Difficulty breathing  Swelling of face and throat  A fast heartbeat  A bad rash all over body  Dizziness and weakness   Immunizations Administered     Name Date Dose VIS Date Route   PFIZER Comrnaty(Gray TOP) Covid-19 Vaccine 02/27/2021 11:05 AM 0.3 mL 12/31/2020 Intramuscular   Manufacturer: [employer]   Lot: [id number]   [employer]: [phone]

## 2021-02-27 NOTE — Progress Notes (Signed)
Patient: Travis [name]  DOB: 1950/07/22 MRN: [id number] PCP: Lajean Manes, MD    Subjective:  Travis [name] is a 70 y.o. with a history of right hip prosthetic joint infection currently undergoing treatment following hardware removal for two-stage resection arthroplasty on 09/25/2020 (Dr. Alvan Dame).  Intraoperative cultures were negative and he was discharged on vancomycin plus ceftriaxone via PICC line in [facility].   On 10/13/2020 developed fever, rash, diarrhea with work-up revealing peripheral eosinophilia, drug rash and pneumonia that was thought to be due to vancomycin.  New antibiotic regimen going forward daptomycin plus ceftriaxone once daily.  Follow-up with Dr. Johnnye Sima on 10/16/2020 felt to be more consistent with fluid overload, persistent/worsening drug rash which prompted ceftriaxone to be on hold temporarily.  On 10/16/2020 placed on oral linezolid and resumed ceftriaxone. He still having trouble with marked lower extremity edema and had follow-up with his cardiologist [name] recently and initiated Lasix every other day. Follow-up with Dr. Baxter Flattery on 10/21/2020 with uptrending eosinophilia, ceftriaxone was stopped with plans to initiate ertapenem the week of 10/27/2020. 6 week treatment to be completed 7/08.   Hip was re-implanted hip on 12/25/2020 after a negative joint aspirate and was given linezolid x 67m by Dr. Alvan Dame at that time. He has not had any labs to his knowledge since going back on this medication. See phone note for further details from 10/26 but was having some neuropathic like side effects from what he believes is the medication. Possible that the neuropathy in the left foot is a little worse but has also been working on some problems with his ankle/achilles tendon. He has been off linezolid 48 hours and feels much better since stopping.  Denies any vision changes / altered color perception. No bleeding/bruising noted.    Review of Systems  Constitutional:  Negative for  chills and fever.  Respiratory:  Negative for cough and shortness of breath.   Cardiovascular:  Negative for leg swelling.  Musculoskeletal:  Positive for joint pain (Lt ankle, Rt hip is sore at times but improved significantly.).  Neurological:  Positive for sensory change. Negative for dizziness and headaches.  Endo/Heme/Allergies:  Does not bruise/bleed easily.     Past Medical History:  Diagnosis Date   Arthritis    "comes and goes; mostly in hands, occasionally elbow" (61/08/4313)   Complication of anesthesia    "just wanted to sleep alot  for couple days S/P VARICOCELE OR" (04/05/2017)   DVT (deep venous thrombosis) (HCC)    LLE   Dysrhythmia    PVCs    GERD (gastroesophageal reflux disease)    History of hiatal hernia    Hypertension    Impaired glucose tolerance    Myeloma ([place])    Obesity (BMI 30-39.9) 07/26/2016   OSA on CPAP 07/26/2016   Mild with AHI 12.5/hr now on CPAP at 14cm H2O   PAF (paroxysmal atrial fibrillation) ([place])    s/p PVI Ablation in 2018 // Flecainide Rx // Echo 8/21: EF 60-65, no RWMA, mild LVH, normal RV SF, moderate RVE, mild BAE, trivial MR, mild dilation of aortic root (40 mm)   Pneumonia    "may have had walking pneumonia a few years ago" (04/05/2017)   Pre-diabetes    Thrombophlebitis     Outpatient Medications Prior to Visit  Medication Sig Dispense Refill   amLODipine (NORVASC) 10 MG tablet Take 10 mg by mouth daily.      apixaban (ELIQUIS) 5 MG TABS tablet TAKE ONE TABLET  BY MOUTH TWICE A DAY 60 tablet 5   b complex vitamins tablet Take 1 tablet by mouth daily.     cholecalciferol (VITAMIN D3) 25 MCG (1000 UNIT) tablet Take 1,000 Units by mouth daily.     diltiazem (CARDIZEM) 30 MG tablet Take 1 Tablet Every 4 Hours As Needed For Afib HR Over 100. If using frequently please call our office [phone] 30 tablet 1   docusate sodium (COLACE) 100 MG capsule Take 1 capsule (100 mg total) by mouth 2 (two) times daily. 10 capsule 0   flecainide  (TAMBOCOR) 50 MG tablet Take 1.5 tablets (75 mg total) by mouth 2 (two) times daily. 270 tablet 3   fluticasone (FLONASE) 50 MCG/ACT nasal spray Place 1 spray into both nostrils daily as needed for allergies or rhinitis.     folic acid (FOLVITE) 1 MG tablet Take 1 mg by mouth daily.     furosemide (LASIX) 40 MG tablet Take 1 tablet (40 mg total) by mouth every other day. 40 tablet 1   gabapentin (NEURONTIN) 300 MG capsule TAKE TWO CAPSULES BY MOUTH TWICE A DAY 180 capsule 3   hydrochlorothiazide (HYDRODIURIL) 25 MG tablet Take 1 tablet (25 mg total) by mouth daily. 90 tablet 3   HYDROcodone-acetaminophen (NORCO/VICODIN) 5-325 MG tablet Take 1-2 tablets by mouth every 6 (six) hours as needed for moderate pain or severe pain. 30 tablet 0   lisinopril (ZESTRIL) 40 MG tablet TAKE ONE TABLET BY MOUTH ONE TIME DAILY (Patient taking differently: Take 40 mg by mouth daily.) 90 tablet 3   metoprolol succinate (TOPROL-XL) 100 MG 24 hr tablet Take 100 mg by mouth daily.      oxymetazoline (AFRIN) 0.05 % nasal spray Place 1 spray into both nostrils 2 (two) times daily as needed for congestion.     polyethylene glycol (MIRALAX / GLYCOLAX) 17 g packet Take 17 g by mouth daily as needed for mild constipation. 14 each 0   potassium chloride SA (KLOR-CON) 20 MEQ tablet Take 1 tablet (20 mEq total) by mouth daily. Take 40 mg on the Days that you take Lasix 135 tablet 3   sodium chloride (OCEAN) 0.65 % SOLN nasal spray Place 1 spray into both nostrils as needed for congestion.     triamcinolone cream (KENALOG) 0.1 % Apply 1 application topically daily as needed (itching).     methocarbamol (ROBAXIN) 500 MG tablet Take 1 tablet (500 mg total) by mouth every 6 (six) hours as needed for muscle spasms. (Patient not taking: Reported on 02/27/2021) 40 tablet 0   No facility-administered medications prior to visit.     Allergies  Allergen Reactions   Linezolid Other (See Comments)    Burning tongue    Vancomycin Rash     Social History   Tobacco Use   Smoking status: Never   Smokeless tobacco: Never  Vaping Use   Vaping Use: Never used  Substance Use Topics   Alcohol use: Yes    Alcohol/week: 14.0 standard drinks    Types: 14 Glasses of wine per week    Comment: wine on weekend    Drug use: No    Family History  Problem Relation Age of Onset   Dementia Mother    Stroke Father    Atrial fibrillation Father    Hypertension Father    Hypertension Paternal Grandfather    Dementia Maternal Grandmother     Objective:   Vitals:   02/27/21 1047  BP: (!) 147/78  Pulse: [name])  52  Resp: 16  Temp: 98.3 F (36.8 C)  TempSrc: Oral  SpO2: 98%  Weight: 225 lb 6.4 oz (102.2 kg)  Height: 5\' 10"  (1.778 m)   Body mass index is 32.34 kg/m.  Physical Exam Constitutional:      Appearance: Normal appearance. He is not ill-appearing.  HENT:     Head: Normocephalic.     Mouth/Throat:     Mouth: Mucous membranes are moist.     Pharynx: Oropharynx is clear.     Comments: Tongue is clear. Pink and without any lesions or discoloration.  Eyes:     General: No scleral icterus. Cardiovascular:     Rate and Rhythm: Normal rate.  Pulmonary:     Effort: Pulmonary effort is normal.  Musculoskeletal:        General: Normal range of motion.     Cervical back: Normal range of motion.  Skin:    Coloration: Skin is not jaundiced or pale.     Findings: No bruising.  Neurological:     Mental Status: He is alert and oriented to person, place, and time.  Psychiatric:        Mood and Affect: Mood normal.        Judgment: Judgment normal.    Assessment & Plan:   Problem List Items Addressed This Visit       Unprioritized   S/P revision of right total hip    Culture negative now s/p 2-stage with reimplantation about 8-9 weeks ago. Doing well and now rehabing for this. Having side effects to linezolid, which is typically not meant to be a long term medication without close monitoring monthly due to  potential toxicities associated with it.   Discussed with Dr. Alvan Dame yesterday 10/27 and he would prefer to have the patient continue on antibiotic for 54m following re-implantation. He has an appointment to see Dr. Alvan Dame back in November for follow up and discussion thereafter. I discussed with [name] today that there was very limited data on this approach (one study from Open [employer] forum I am aware of that did not find it statistically significant). I would always have the same degree of concern for relapse no matter when he stops. Will give doxy 100 mg bid for 4 weeks (he has tolerated this well in the past).  Counseled to take with full glass of water, food if he has nausea and photosensitivity concern. He would not like to take antibiotics the rest of his life if possible and understands the only way we know if this is cured is with stopping and close follow up on how his hip does clinically paired with blood or repeat arthrocentesis if indicated.   I asked him to please contact me when he sees Dr. Alvan Dame again to update me so we can discuss how we can continue to help with his plan of care if needed.       Medication monitoring encounter - Primary    No petechiae on exam indicating hopefully a preserved platelet count. Will check CBC today. 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  He had a vision exam recently that was unremarkable for any acute retinitis/retinopathy features that can also be associated with long term linezolid.       Relevant Orders   CBC with Differential/Platelet    Janene [name], MSN, NP-C [facility]  [place] Pager: [phone] Office: [phone]  02/27/21  2:37 PM

## 2021-02-27 NOTE — Assessment & Plan Note (Signed)
No petechiae on exam indicating hopefully a preserved platelet count. Will check CBC today. 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  He had a vision exam recently that was unremarkable for any acute retinitis/retinopathy features that can also be associated with long term linezolid.

## 2021-02-27 NOTE — Assessment & Plan Note (Addendum)
Culture negative now s/p 2-stage with reimplantation about 8-9 weeks ago. Doing well and now rehabing for this. Having side effects to linezolid, which is typically not meant to be a long term medication without close monitoring monthly due to potential toxicities associated with it.   Discussed with Dr. Alvan Dame yesterday 10/27 and he would prefer to have the patient continue on antibiotic for 68m following re-implantation. He has an appointment to see Dr. Alvan Dame back in November for follow up and discussion thereafter. I discussed with Travis [name] today that there was very limited data on this approach (one study from Open ID forum I am aware of that did not find it statistically significant). I would always have the same degree of concern for relapse no matter when he stops. Will give doxy 100 mg bid for 4 weeks (he has tolerated this well in the past).  Counseled to take with full glass of water, food if he has nausea and photosensitivity concern. He would not like to take antibiotics the rest of his life if possible and understands the only way we know if this is cured is with stopping and close follow up on how his hip does clinically paired with blood or repeat arthrocentesis if indicated.   I asked him to please contact me when he sees Dr. Alvan Dame again to update me so we can discuss how we can continue to help with his plan of care if needed.

## 2021-02-27 NOTE — Patient Instructions (Addendum)
Nice to see you today!  Will STOP linezolid and START the doxycycline twice a day  Stay out of the sun! Can cause bad sun burns If you have nausea you can take it 30 min or so after food.  Swallow pills with at least half a glass of water - not just a sip.   Please call or send a mychart update after you see Dr. Alvan Dame so we can help with the next steps for you

## 2021-02-28 DIAGNOSIS — R002 Palpitations: Secondary | ICD-10-CM | POA: Diagnosis not present

## 2021-02-28 DIAGNOSIS — I480 Paroxysmal atrial fibrillation: Secondary | ICD-10-CM | POA: Diagnosis not present

## 2021-03-03 DIAGNOSIS — M25551 Pain in right hip: Secondary | ICD-10-CM | POA: Diagnosis not present

## 2021-03-04 NOTE — Telephone Encounter (Signed)
PLease see about adding patient on when I am in clinic again  Can over book

## 2021-03-09 DIAGNOSIS — M25551 Pain in right hip: Secondary | ICD-10-CM | POA: Diagnosis not present

## 2021-03-10 ENCOUNTER — Other Ambulatory Visit: Payer: Self-pay | Admitting: Neurology

## 2021-03-10 DIAGNOSIS — M65322 Trigger finger, left index finger: Secondary | ICD-10-CM | POA: Diagnosis not present

## 2021-03-11 NOTE — Progress Notes (Signed)
a   Cardiology Office Note   Date:  03/12/2021   ID:  Travis [name], Travis [name] [birth year], MRN [id number]  PCP:  Lajean Manes, MD  Cardiologist:   [name], MD    Patient presents for follow up of HTN, PAF and palpitations     History of Present Illness: [name] is a 70 y.o. male with a history of  HTN, OSA and PAF (CHADSVASc score 4; s/p Afib ablation 2018  On flecanide).   The pt had Cardiac CTA in 2018 which showed no CAD  Calcium score was 0  Pt also has hx of DVT, peripheral neuropathy and MGUS I last saw the patient in clinic back in June 2022.  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  Summer was followed by ID and orthopedics.  The patient called in recently complaining of increased palpitations thought that they were PVCs.  I set him up for an event monitor.  After he picked up the monitor he said the skips improved.  He denies dizziness.  He denies chest pain.  He denies symptoms of atrial fibrillation.  He continues to follow with orthopedics.  Having problems with his feet.  Current Meds  Medication Sig   amLODipine (NORVASC) 10 MG tablet Take 10 mg by mouth daily.    apixaban (ELIQUIS) 5 MG TABS tablet TAKE ONE TABLET BY MOUTH TWICE A DAY   b complex vitamins tablet Take 1 tablet by mouth daily.   cholecalciferol (VITAMIN D3) 25 MCG (1000 UNIT) tablet Take 1,000 Units by mouth daily.   diltiazem (CARDIZEM) 30 MG tablet Take 1 Tablet Every 4 Hours As Needed For Afib HR Over 100. If using frequently please call our office [phone]   docusate sodium (COLACE) 100 MG capsule Take 1 capsule (100 mg total) by mouth 2 (two) times daily.   doxycycline (VIBRA-TABS) 100 MG tablet Take 1 tablet (100 mg total) by mouth 2 (two) times daily for 28 days.   flecainide (TAMBOCOR) 50 MG tablet Take 1.5 tablets (75 mg total) by mouth 2 (two) times daily.   fluticasone (FLONASE) 50 MCG/ACT nasal spray Place 1 spray into both nostrils daily as  needed for allergies or rhinitis.   folic acid (FOLVITE) 1 MG tablet Take 1 mg by mouth daily.   furosemide (LASIX) 40 MG tablet Take 1 tablet (40 mg total) by mouth every other day.   gabapentin (NEURONTIN) 300 MG capsule TAKE TWO CAPSULES BY MOUTH TWICE A DAY   hydrochlorothiazide (HYDRODIURIL) 25 MG tablet Take 1 tablet (25 mg total) by mouth daily.   HYDROcodone-acetaminophen (NORCO/VICODIN) 5-325 MG tablet Take 1-2 tablets by mouth every 6 (six) hours as needed for moderate pain or severe pain.   lisinopril (ZESTRIL) 40 MG tablet TAKE ONE TABLET BY MOUTH ONE TIME DAILY   metoprolol succinate (TOPROL-XL) 100 MG 24 hr tablet Take 100 mg by mouth daily.    oxymetazoline (AFRIN) 0.05 % nasal spray Place 1 spray into both nostrils 2 (two) times daily as needed for congestion.   potassium chloride SA (KLOR-CON) 20 MEQ tablet Take 1 tablet (20 mEq total) by mouth daily. Take 40 mg on the Days that you take Lasix   sodium chloride (OCEAN) 0.65 % SOLN nasal spray Place 1 spray into both nostrils as needed for congestion.   triamcinolone cream (KENALOG) 0.1 % Apply 1 application topically daily as needed (itching).     Allergies:   Linezolid and Vancomycin   Past  Medical History:  Diagnosis Date   Arthritis    "comes and goes; mostly in hands, occasionally elbow" (57/07/2200)   Complication of anesthesia    "just wanted to sleep alot  for couple days S/P VARICOCELE OR" (04/05/2017)   DVT (deep venous thrombosis) (HCC)    LLE   Dysrhythmia    PVCs    GERD (gastroesophageal reflux disease)    History of hiatal hernia    Hypertension    Impaired glucose tolerance    Myeloma ([place])    Obesity (BMI 30-39.9) 07/26/2016   OSA on CPAP 07/26/2016   Mild with AHI 12.5/hr now on CPAP at 14cm H2O   PAF (paroxysmal atrial fibrillation) ([place])    s/p PVI Ablation in 2018 // Flecainide Rx // Echo 8/21: EF 60-65, no RWMA, mild LVH, normal RV SF, moderate RVE, mild BAE, trivial MR, mild dilation of aortic  root (40 mm)   Pneumonia    "may have had walking pneumonia a few years ago" (04/05/2017)   Pre-diabetes    Thrombophlebitis     Past Surgical History:  Procedure Laterality Date   ATRIAL FIBRILLATION ABLATION  04/05/2017   ATRIAL FIBRILLATION ABLATION N/A 04/05/2017   Procedure: ATRIAL FIBRILLATION ABLATION;  Surgeon: Thompson Grayer, MD;  Location: [facility];  Service: Cardiovascular;  Laterality: N/A;   COMPLETE RIGHT HIP REPLACMENT  01/19/2019   EVALUATION UNDER ANESTHESIA WITH HEMORRHOIDECTOMY N/A 02/24/2017   Procedure: EXAM UNDER ANESTHESIA WITH  HEMORRHOIDECTOMY;  Surgeon: Michael Boston, MD;  Location: [facility];  Service: General;  Laterality: N/A;   EXCISIONAL TOTAL HIP ARTHROPLASTY WITH ANTIBIOTIC SPACERS Right 09/25/2020   Procedure: EXCISIONAL TOTAL HIP ARTHROPLASTY WITH ANTIBIOTIC SPACERS;  Surgeon: Paralee Cancel, MD;  Location: [facility];  Service: Orthopedics;  Laterality: Right;   FLEXIBLE SIGMOIDOSCOPY N/A 04/15/2015   Procedure:  UNSEDATED FLEXIBLE SIGMOIDOSCOPY;  Surgeon: [name], MD;  Location: [facility];  Service: Endoscopy;  Laterality: N/A;   KNEE ARTHROSCOPY Left ~ 2016   REIMPLANTATION OF TOTAL HIP Right 12/25/2020   Procedure: REIMPLANTATION/REVISION OF RIGHT TOTAL HIP VERSUS REPEAT IRRIGATION AND DEBRIDEMENT;  Surgeon: Paralee Cancel, MD;  Location: [facility];  Service: Orthopedics;  Laterality: Right;   TESTICLE SURGERY  1988   VARICOCELE   TONSILLECTOMY     VARICOSE VEIN SURGERY Bilateral      Social History:  The patient  reports that he has never smoked. He has never used smokeless tobacco. He reports current alcohol use of about 14.0 standard drinks per week. He reports that he does not use drugs.   Family History:  The patient's family history includes Atrial fibrillation in his father; Dementia in his maternal grandmother and mother; Hypertension in his father and paternal grandfather; Stroke in his father.    ROS:  Please see the history of  present illness. All other systems are reviewed and  Negative to the above problem except as noted.    PHYSICAL EXAM: VS:  BP 138/72 (BP Location: Right Arm, Patient Position: Sitting, Cuff Size: Large)   Pulse (!) 51   Ht 5\' 10"  (1.778 m)   Wt 258 lb 12.8 oz (117.4 kg)   SpO2 95%   BMI 37.13 kg/m   GEN: Morbidly obese 70 yo in no acute distress  HEENT: normal  Neck: no JVD, Cardiac: RRR; no murmurs   edema  Respiratory:  clear to auscultation bilaterally,   GI: soft, nontender, nondistended, + BS   MS: no deformity Moving all extremities  Skin: warm and dry   Diffuse maculopapular rash   Neuro:  Deferred  EKG:  EKG is not ordered     Lipid Panel No results found for: CHOL, TRIG, HDL, CHOLHDL, VLDL, LDLCALC, LDLDIRECT    Wt Readings from Last 3 Encounters:  03/12/21 258 lb 12.8 oz (117.4 kg)  02/27/21 225 lb 6.4 oz (102.2 kg)  12/25/20 250 lb 7.1 oz (113.6 kg)      ASSESSMENT AND PLAN: 1.  Palpitations.  Monitor showed rare PACs and PVCs.  One short burst of SVT.  Otherwise sinus rhythm average heart rate was 53.  Recommendation: I would continue to follow.  He could try backing down to 75 daily of his Toprol seen with increase in average heart rate a little he may feel palpitations less.  He actually says he is not bothered right now  2.  To history of PAF no recurrence on monitor.  No symptomatic recurrence.  Continue on current regimen except changes as noted  3 lower extremity edema.  Improved with Lasix every other day.  He is actually taking 2 times per week.  4.  Hypertension.  We will need to follow this especially with changes.  5.  Lipids.  LDL 61, HDL 46.  Follow.  6.  OSA.  Followed by [facility].  Continue CPAP  7.  Morbid obesity discussed low carbs.  He is actually eating low-fat.  He knows he needs to cut back on things  I will set to see the patient back in March, sooner for problems.  He will MyChart in with blood pressures when he makes a  change. 1      Current medicines are reviewed at length with the patient today.  The patient does not have concerns regarding medicines.  Signed, [name], MD  03/12/2021 9:20 AM    [address], [place], [place]  [address] Phone: [phone]; Fax: [phone]

## 2021-03-12 ENCOUNTER — Other Ambulatory Visit: Payer: Self-pay

## 2021-03-12 ENCOUNTER — Ambulatory Visit: Payer: Medicare Other | Admitting: Internal Medicine

## 2021-03-12 ENCOUNTER — Encounter: Payer: Self-pay | Admitting: Internal Medicine

## 2021-03-12 VITALS — BP 138/72 | HR 51 | Ht 70.0 in | Wt 258.8 lb

## 2021-03-12 DIAGNOSIS — M25551 Pain in right hip: Secondary | ICD-10-CM | POA: Diagnosis not present

## 2021-03-12 DIAGNOSIS — I480 Paroxysmal atrial fibrillation: Secondary | ICD-10-CM | POA: Diagnosis not present

## 2021-03-12 MED ORDER — METOPROLOL SUCCINATE ER 100 MG PO TB24: ORAL_TABLET | ORAL | 3 refills | Status: DC

## 2021-03-12 MED ORDER — FUROSEMIDE 40 MG PO TABS: ORAL_TABLET | ORAL | 1 refills | Status: DC

## 2021-03-12 NOTE — Patient Instructions (Signed)
Medication Instructions:  Take only 1/2 to 3/4 of your Metoprolol and monitor how you feel and let us know via My Chart how you are feeling and your BP readings  *If you need a refill on your cardiac medications before your next appointment, please call your pharmacy*   Lab Work: none If you have labs (blood work) drawn today and your tests are completely normal, you will receive your results only by: [place] (if you have MyChart) OR A paper copy in the mail If you have any lab test that is abnormal or we need to change your treatment, we will call you to review the results.   Testing/Procedures: none   Follow-Up: At [facility], you and your health needs are our priority.  As part of our continuing mission to provide you with exceptional heart care, we have created designated Provider Care Teams.  These Care Teams include your primary Cardiologist (physician) and Advanced Practice Providers (APPs -  Physician Assistants and Nurse Practitioners) who all work together to provide you with the care you need, when you need it.  We recommend signing up for the patient portal called "MyChart".  Sign up information is provided on this After Visit Summary.  MyChart is used to connect with patients for Virtual Visits (Telemedicine).  Patients are able to view lab/test results, encounter notes, upcoming appointments, etc.  Non-urgent messages can be sent to your provider as well.   To learn more about what you can do with MyChart, go to [email or web address].    Your next appointment:   4 month(s)  The format for your next appointment:   In Person  Provider:   [name], MD     Other Instructions

## 2021-03-17 DIAGNOSIS — M25551 Pain in right hip: Secondary | ICD-10-CM | POA: Diagnosis not present

## 2021-03-18 DIAGNOSIS — M19072 Primary osteoarthritis, left ankle and foot: Secondary | ICD-10-CM | POA: Diagnosis not present

## 2021-03-18 DIAGNOSIS — Z96641 Presence of right artificial hip joint: Secondary | ICD-10-CM | POA: Diagnosis not present

## 2021-03-18 DIAGNOSIS — Z471 Aftercare following joint replacement surgery: Secondary | ICD-10-CM | POA: Diagnosis not present

## 2021-03-19 DIAGNOSIS — M25551 Pain in right hip: Secondary | ICD-10-CM | POA: Diagnosis not present

## 2021-03-23 ENCOUNTER — Other Ambulatory Visit: Payer: Self-pay

## 2021-03-23 ENCOUNTER — Encounter: Payer: Medicare Other | Attending: Geriatric Medicine | Admitting: Dietician

## 2021-03-23 DIAGNOSIS — R7303 Prediabetes: Secondary | ICD-10-CM | POA: Insufficient documentation

## 2021-03-23 NOTE — Progress Notes (Signed)
On 03/23/2021 patient completed a post core session of the Diabetes Prevention Program course virtually with [employer] [facility] [employer] [facility]. By the end of this session patients are able to complete the following objectives:   Virtual Visit via Video Note  I connected with Satira Anis. [place], Nov 02, 1950 by a video enabled application and verified that I am speaking with the correct person using two identifiers.  Location: Patient: Virtual Provider: Office  Learning Objectives: List 9 ways to to stay on track during special events/occasions. Create a plan to avoid a food problem at an upcoming special event/occasion Describe ways to make time for healthy behaviors during special events/occasions   Goals:  Record weight taken outside of class.  Track foods and beverages eaten each day in the "Food and Activity Tracker," including calories and fat grams for each item.   Track activity type, minutes you were active, and distance you reached each day in the "Food and Activity Tracker."   Follow-Up Plan: Attend next session.  Email completed "Food and Activity Trackers" before next session to be reviewed by Lifestyle [facility].

## 2021-03-30 ENCOUNTER — Other Ambulatory Visit: Payer: Self-pay

## 2021-03-30 ENCOUNTER — Telehealth: Payer: Self-pay

## 2021-03-30 MED ORDER — DOXYCYCLINE HYCLATE 100 MG PO TABS: 100.0000 mg | ORAL_TABLET | Freq: Two times a day (BID) | ORAL | 2 refills | Status: AC

## 2021-03-30 NOTE — Telephone Encounter (Signed)
Okay to refill and follow up with [facility].

## 2021-03-30 NOTE — Telephone Encounter (Signed)
Received call today from Pharmacist at [facility] regarding doxy rx. Patient is requesting refill on antibiotics per mychart message 11/ 17. Medication not listed under current medication. Will forward message to Colletta [place], NP to advise on refill Leatrice Jewels, RMA

## 2021-03-31 DIAGNOSIS — M25551 Pain in right hip: Secondary | ICD-10-CM | POA: Diagnosis not present

## 2021-04-02 ENCOUNTER — Other Ambulatory Visit: Payer: Medicare Other

## 2021-04-03 DIAGNOSIS — M5451 Vertebrogenic low back pain: Secondary | ICD-10-CM | POA: Diagnosis not present

## 2021-04-06 DIAGNOSIS — M6281 Muscle weakness (generalized): Secondary | ICD-10-CM | POA: Diagnosis not present

## 2021-04-06 DIAGNOSIS — M5459 Other low back pain: Secondary | ICD-10-CM | POA: Diagnosis not present

## 2021-04-09 ENCOUNTER — Inpatient Hospital Stay: Payer: Medicare Other

## 2021-04-09 ENCOUNTER — Ambulatory Visit: Payer: Medicare Other | Admitting: Hematology and Oncology

## 2021-04-10 ENCOUNTER — Inpatient Hospital Stay: Payer: Medicare Other | Attending: Hematology and Oncology

## 2021-04-10 ENCOUNTER — Other Ambulatory Visit: Payer: Self-pay

## 2021-04-10 DIAGNOSIS — Z7901 Long term (current) use of anticoagulants: Secondary | ICD-10-CM | POA: Diagnosis not present

## 2021-04-10 DIAGNOSIS — Z881 Allergy status to other antibiotic agents status: Secondary | ICD-10-CM | POA: Diagnosis not present

## 2021-04-10 DIAGNOSIS — M6281 Muscle weakness (generalized): Secondary | ICD-10-CM | POA: Diagnosis not present

## 2021-04-10 DIAGNOSIS — M5459 Other low back pain: Secondary | ICD-10-CM | POA: Diagnosis not present

## 2021-04-10 DIAGNOSIS — D472 Monoclonal gammopathy: Secondary | ICD-10-CM | POA: Insufficient documentation

## 2021-04-10 DIAGNOSIS — Z79899 Other long term (current) drug therapy: Secondary | ICD-10-CM | POA: Diagnosis not present

## 2021-04-10 LAB — CBC WITH DIFFERENTIAL (CANCER CENTER ONLY)
Abs Immature Granulocytes: 0.02 10*3/uL (ref 0.00–0.07)
Basophils Absolute: 0 10*3/uL (ref 0.0–0.1)
Basophils Relative: 0 %
Eosinophils Absolute: 0.2 10*3/uL (ref 0.0–0.5)
Eosinophils Relative: 2 %
HCT: 41.1 % (ref 39.0–52.0)
Hemoglobin: 14 g/dL (ref 13.0–17.0)
Immature Granulocytes: 0 %
Lymphocytes Relative: 14 %
Lymphs Abs: 1.2 10*3/uL (ref 0.7–4.0)
MCH: 32 pg (ref 26.0–34.0)
MCHC: 34.1 g/dL (ref 30.0–36.0)
MCV: 93.8 fL (ref 80.0–100.0)
Monocytes Absolute: 0.9 10*3/uL (ref 0.1–1.0)
Monocytes Relative: 10 %
Neutro Abs: 6.3 10*3/uL (ref 1.7–7.7)
Neutrophils Relative %: 74 %
Platelet Count: 267 10*3/uL (ref 150–400)
RBC: 4.38 MIL/uL (ref 4.22–5.81)
RDW: 15.9 % — ABNORMAL HIGH (ref 11.5–15.5)
WBC Count: 8.5 10*3/uL (ref 4.0–10.5)
nRBC: 0 % (ref 0.0–0.2)

## 2021-04-10 LAB — CMP (CANCER CENTER ONLY)
ALT: 23 U/L (ref 0–44)
AST: 21 U/L (ref 15–41)
Albumin: 3.9 g/dL (ref 3.5–5.0)
Alkaline Phosphatase: 112 U/L (ref 38–126)
Anion gap: 10 (ref 5–15)
BUN: 17 mg/dL (ref 8–23)
CO2: 25 mmol/L (ref 22–32)
Calcium: 9 mg/dL (ref 8.9–10.3)
Chloride: 106 mmol/L (ref 98–111)
Creatinine: 0.88 mg/dL (ref 0.61–1.24)
GFR, Estimated: >60 mL/min (ref >60)
Glucose, Bld: 114 mg/dL — ABNORMAL HIGH (ref 70–99)
Potassium: 3.5 mmol/L (ref 3.5–5.1)
Sodium: 141 mmol/L (ref 135–145)
Total Bilirubin: 0.5 mg/dL (ref 0.3–1.2)
Total Protein: 6.8 g/dL (ref 6.5–8.1)

## 2021-04-12 ENCOUNTER — Encounter: Payer: Self-pay | Admitting: Internal Medicine

## 2021-04-13 DIAGNOSIS — M6281 Muscle weakness (generalized): Secondary | ICD-10-CM | POA: Diagnosis not present

## 2021-04-13 DIAGNOSIS — M5459 Other low back pain: Secondary | ICD-10-CM | POA: Diagnosis not present

## 2021-04-13 LAB — KAPPA/LAMBDA LIGHT CHAINS
Kappa free light chain: 580.5 mg/L — ABNORMAL HIGH (ref 3.3–19.4)
Kappa, lambda light chain ratio: 82.93 — ABNORMAL HIGH (ref 0.26–1.65)
Lambda free light chains: 7 mg/L (ref 5.7–26.3)

## 2021-04-13 LAB — MULTIPLE MYELOMA PANEL, SERUM
Albumin SerPl Elph-Mcnc: 3.7 g/dL (ref 2.9–4.4)
Albumin/Glob SerPl: 1.5 (ref 0.7–1.7)
Alpha 1: 0.2 g/dL (ref 0.0–0.4)
Alpha2 Glob SerPl Elph-Mcnc: 0.6 g/dL (ref 0.4–1.0)
B-Globulin SerPl Elph-Mcnc: 0.9 g/dL (ref 0.7–1.3)
Gamma Glob SerPl Elph-Mcnc: 0.8 g/dL (ref 0.4–1.8)
Globulin, Total: 2.5 g/dL (ref 2.2–3.9)
IgA: 386 mg/dL (ref 61–437)
IgG (Immunoglobin G), Serum: 534 mg/dL — ABNORMAL LOW (ref 603–1613)
IgM (Immunoglobulin M), Srm: 18 mg/dL — ABNORMAL LOW (ref 20–172)
M Protein SerPl Elph-Mcnc: 0.3 g/dL — ABNORMAL HIGH
Total Protein ELP: 6.2 g/dL (ref 6.0–8.5)

## 2021-04-15 ENCOUNTER — Other Ambulatory Visit: Payer: Self-pay

## 2021-04-15 ENCOUNTER — Ambulatory Visit (INDEPENDENT_AMBULATORY_CARE_PROVIDER_SITE_OTHER): Payer: Medicare Other | Admitting: Infectious Diseases

## 2021-04-15 ENCOUNTER — Encounter: Payer: Self-pay | Admitting: Infectious Diseases

## 2021-04-15 VITALS — BP 152/84 | HR 53 | Temp 98.2°F | Wt 260.0 lb

## 2021-04-15 DIAGNOSIS — T8451XA Infection and inflammatory reaction due to internal right hip prosthesis, initial encounter: Secondary | ICD-10-CM

## 2021-04-15 NOTE — Progress Notes (Signed)
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 with above-mentioned history of smoldering myeloma. He presents to the clinic today for follow-up.  He reports no new concerns or issues.  He underwent bilateral hip replacement surgeries.  He is recovered from all of that and is able to walk without the help of his cane.  ALLERGIES:  is allergic to linezolid and vancomycin.  MEDICATIONS:  Current Outpatient Medications  Medication Sig Dispense Refill   amLODipine (NORVASC) 10 MG tablet Take 10 mg by mouth daily.      apixaban (ELIQUIS) 5 MG TABS tablet TAKE ONE TABLET BY MOUTH TWICE A DAY 60 tablet 5   b complex vitamins tablet Take 1 tablet by mouth daily.     cholecalciferol (VITAMIN D3) 25 MCG (1000 UNIT) tablet Take 1,000 Units by mouth daily.     diltiazem (CARDIZEM) 30 MG tablet Take 1 tablet (30 mg total) by mouth 3 (three) times daily. 270 tablet 3   docusate sodium (COLACE) 100 MG capsule Take 1 capsule  (100 mg total) by mouth 2 (two) times daily. 10 capsule 0   doxycycline (VIBRA-TABS) 100 MG tablet Take 1 tablet (100 mg total) by mouth 2 (two) times daily. 60 tablet 2   flecainide (TAMBOCOR) 50 MG tablet Take 1.5 tablets (75 mg total) by mouth 2 (two) times daily. 270 tablet 3   fluticasone (FLONASE) 50 MCG/ACT nasal spray Place 1 spray into both nostrils daily as needed for allergies or rhinitis.     folic acid (FOLVITE) 1 MG tablet Take 1 mg by mouth daily.     furosemide (LASIX) 40 MG tablet Take 40 mg by mouth twice a week. 40 tablet 1   gabapentin (NEURONTIN) 300 MG capsule TAKE TWO CAPSULES BY MOUTH TWICE A DAY 180 capsule 3   hydrochlorothiazide (HYDRODIURIL) 25 MG tablet Take 1 tablet (25 mg total) by mouth daily. 90 tablet 3   HYDROcodone-acetaminophen (NORCO/VICODIN) 5-325 MG tablet Take 1-2 tablets by mouth every 6 (six) hours as needed for moderate pain or severe pain. 30 tablet 0   lisinopril (ZESTRIL) 40 MG tablet TAKE ONE TABLET BY MOUTH ONE TIME DAILY 90 tablet 3   metoprolol succinate (TOPROL-XL) 50 MG 24 hr tablet Take 1 tablet (50 mg total) by mouth daily. Take with or immediately following a meal. 90 tablet 3   oxymetazoline (AFRIN) 0.05 % nasal spray Place 1 spray into both nostrils 2 (two) times daily as needed for congestion.     polyethylene  glycol (MIRALAX / GLYCOLAX) 17 g packet Take 17 g by mouth daily as needed for mild constipation. 14 each 0   potassium chloride SA (KLOR-CON) 20 MEQ tablet Take 1 tablet (20 mEq total) by mouth daily. Take 40 mg on the Days that you take Lasix 135 tablet 3   sodium chloride (OCEAN) 0.65 % SOLN nasal spray Place 1 spray into both nostrils as needed for congestion.     triamcinolone cream (KENALOG) 0.1 % Apply 1 application topically daily as needed (itching).     No current facility-administered medications for this visit.    PHYSICAL EXAMINATION: ECOG PERFORMANCE STATUS: 1 - Symptomatic but completely ambulatory  Vitals:    04/16/21 1435  BP: (!) 141/73  Pulse: (!) 57  Resp: 17  Temp: 97.9 F (36.6 C)  SpO2: 95%   Filed Weights   04/16/21 1435  Weight: 257 lb 12.8 oz (116.9 kg)    LABORATORY DATA:  I have reviewed the data as listed CMP Latest Ref Rng & Units 04/10/2021 12/26/2020 12/16/2020  Glucose 70 - 99 mg/dL 114(H) 175(H) 117(H)  BUN 8 - 23 mg/dL _0 Creatinine 0.61 - 1.24 mg/dL 0.88 0.95 0.70  Sodium 135 - 145 mmol/L 141 141 137  Potassium 3.5 - 5.1 mmol/L 3.5 4.0 3.3(L)  Chloride 98 - 111 mmol/L 106 105 103  CO2 22 - 32 mmol/L _1 Calcium 8.9 - 10.3 mg/dL 9.0 8.7(L) 9.1  Total Protein 6.5 - 8.1 g/dL 6.8 - 6.7  Total Bilirubin 0.3 - 1.2 mg/dL 0.5 - 0.6  Alkaline Phos 38 - 126 U/L 112 - 88  AST 15 - 41 U/L 21 - 19  ALT 0 - 44 U/L 23 - 19    Lab Results  Component Value Date   WBC 8.0 04/15/2021   HGB 14.5 04/15/2021   HCT 43.0 04/15/2021   MCV 95.6 04/15/2021   PLT 257 04/15/2021   NEUTROABS 6.3 04/10/2021    ASSESSMENT & PLAN:  Smoldering multiple myeloma ([place]) 03/21/2019:Kappa 268 (was 112: 9 months ago), lambda 10.3, ratio 26.07 (it was 15.18), M protein 0.4 g (was 0.6 g)Hemoglobin 14.9, calcium 9.2, creatinine 0.87 03/25/2020: Kappa 623, Lambda 8.9, Ratio: 72, Hb 14, Cr 0.88,  Calcium: 9.5, LDH 265, M Protein 0.2 gm.  05/05/2020: Hemoglobin 14, Kappa 598, lambda 8.2, ratio 72.9, creatinine 0.76, IgG 587, IgA 441, M protein 0.3 g  04/15/2021: Hemoglobin 14.5, Kappa 580, lambda 7, ratio 82.9, creatinine 0.88, calcium 9, IgG 534, IgA: 386 M protein 0.3 g IgA kappa   Bone marrow biopsy 05/05/2020: Hypercellular bone marrow (50%) for age with plasma cells 14% display kappa light chain restriction, FISH pending, cytogenetics: Normal 33 XY   there are any signs or symptoms of myeloma. Patient saw Dr. Baltazar Najjar at [facility] couple of years ago.  He has not been back there.  I will see him back in 1 year for follow-up with labs done ahead of time.    No orders of the  defined types were placed in this encounter.  The patient has a good understanding of the overall plan. he agrees with it. he will call with any problems that may develop before the next visit here.  Total time spent: 20 mins including face to face time and time spent for planning, charting and coordination of care  Rulon Eisenmenger, MD, MPH 04/16/2021  I, Thana Ates, am acting as scribe for Dr. Nicholas Lose.  I have reviewed the  above documentation for accuracy and completeness, and I agree with the above.

## 2021-04-15 NOTE — Progress Notes (Signed)
Patient: Travis [name]  DOB: 1951-04-07 MRN: [id number] PCP: Lajean Manes, MD    Subjective:  Travis [name] is a 70 y.o. with a history of right hip prosthetic joint infection currently undergoing treatment following hardware removal for two-stage resection arthroplasty on 09/25/2020 (Dr. Alvan Dame).  Intraoperative cultures were negative and he was discharged on vancomycin plus ceftriaxone via PICC line in [facility].   On 10/13/2020 developed fever, rash, diarrhea with work-up revealing peripheral eosinophilia, drug rash and pneumonia that was thought to be due to vancomycin.  New antibiotic regimen going forward daptomycin plus ceftriaxone once daily.  Follow-up with Dr. Johnnye Sima on 10/16/2020 felt to be more consistent with fluid overload, persistent/worsening drug rash which prompted ceftriaxone to be on hold temporarily.  On 10/16/2020 placed on oral linezolid and resumed ceftriaxone. He still having trouble with marked lower extremity edema and had follow-up with his cardiologist [name] recently and initiated Lasix every other day. Follow-up with Dr. Baxter Flattery on 10/21/2020 with uptrending eosinophilia, ceftriaxone was stopped with plans to initiate ertapenem the week of 10/27/2020. 6 week treatment to be completed 7/08.   Hip was re-implanted hip on 12/25/2020 after a negative joint aspirate and was given linezolid x 10m by Dr. Alvan Dame at that time. He has not had any labs to his knowledge since going back on this medication. See phone note for further details from 10/26 but was having some neuropathic like side effects from what he believes is the medication. Possible that the neuropathy in the left foot is a little worse but has also been working on some problems with his ankle/achilles tendon. He has been off linezolid 48 hours and feels much better since stopping.  Denies any vision changes / altered color perception. No bleeding/bruising noted.   Has not noticed much in regards to side effects to  the doxycycline which he is happy to report! Going to [place] in January for a visit.  Has a visit back to see Dr. Alvan Dame in January.  No pain in the hip but just a little sore walking around. Rarely uses cane now, just for balance.     Review of Systems  Constitutional:  Negative for chills and fever.  Respiratory:  Negative for cough and shortness of breath.   Cardiovascular:  Negative for leg swelling.  Musculoskeletal:  Positive for joint pain (Lt ankle, Rt hip is sore at times but improved significantly.).  Neurological:  Positive for sensory change. Negative for dizziness and headaches.  Endo/Heme/Allergies:  Does not bruise/bleed easily.     Past Medical History:  Diagnosis Date   Arthritis    "comes and goes; mostly in hands, occasionally elbow" (85/06/7780)   Complication of anesthesia    "just wanted to sleep alot  for couple days S/P VARICOCELE OR" (04/05/2017)   DVT (deep venous thrombosis) (HCC)    LLE   Dysrhythmia    PVCs    GERD (gastroesophageal reflux disease)    History of hiatal hernia    Hypertension    Impaired glucose tolerance    Myeloma ([place])    Obesity (BMI 30-39.9) 07/26/2016   OSA on CPAP 07/26/2016   Mild with AHI 12.5/hr now on CPAP at 14cm H2O   PAF (paroxysmal atrial fibrillation) ([place])    s/p PVI Ablation in 2018 // Flecainide Rx // Echo 8/21: EF 60-65, no RWMA, mild LVH, normal RV SF, moderate RVE, mild BAE, trivial MR, mild dilation of aortic root (40 mm)   Pneumonia    "  may have had walking pneumonia a few years ago" (04/05/2017)   Pre-diabetes    Thrombophlebitis     Outpatient Medications Prior to Visit  Medication Sig Dispense Refill   amLODipine (NORVASC) 10 MG tablet Take 10 mg by mouth daily.      apixaban (ELIQUIS) 5 MG TABS tablet TAKE ONE TABLET BY MOUTH TWICE A DAY 60 tablet 5   b complex vitamins tablet Take 1 tablet by mouth daily.     cholecalciferol (VITAMIN D3) 25 MCG (1000 UNIT) tablet Take 1,000 Units by mouth daily.      docusate sodium (COLACE) 100 MG capsule Take 1 capsule (100 mg total) by mouth 2 (two) times daily. 10 capsule 0   doxycycline (VIBRA-TABS) 100 MG tablet Take 1 tablet (100 mg total) by mouth 2 (two) times daily. 60 tablet 2   flecainide (TAMBOCOR) 50 MG tablet Take 1.5 tablets (75 mg total) by mouth 2 (two) times daily. 270 tablet 3   fluticasone (FLONASE) 50 MCG/ACT nasal spray Place 1 spray into both nostrils daily as needed for allergies or rhinitis.     folic acid (FOLVITE) 1 MG tablet Take 1 mg by mouth daily.     furosemide (LASIX) 40 MG tablet Take 40 mg by mouth twice a week. 40 tablet 1   gabapentin (NEURONTIN) 300 MG capsule TAKE TWO CAPSULES BY MOUTH TWICE A DAY 180 capsule 3   hydrochlorothiazide (HYDRODIURIL) 25 MG tablet Take 1 tablet (25 mg total) by mouth daily. 90 tablet 3   HYDROcodone-acetaminophen (NORCO/VICODIN) 5-325 MG tablet Take 1-2 tablets by mouth every 6 (six) hours as needed for moderate pain or severe pain. 30 tablet 0   lisinopril (ZESTRIL) 40 MG tablet TAKE ONE TABLET BY MOUTH ONE TIME DAILY 90 tablet 3   oxymetazoline (AFRIN) 0.05 % nasal spray Place 1 spray into both nostrils 2 (two) times daily as needed for congestion.     polyethylene glycol (MIRALAX / GLYCOLAX) 17 g packet Take 17 g by mouth daily as needed for mild constipation. 14 each 0   potassium chloride SA (KLOR-CON) 20 MEQ tablet Take 1 tablet (20 mEq total) by mouth daily. Take 40 mg on the Days that you take Lasix 135 tablet 3   sodium chloride (OCEAN) 0.65 % SOLN nasal spray Place 1 spray into both nostrils as needed for congestion.     triamcinolone cream (KENALOG) 0.1 % Apply 1 application topically daily as needed (itching).     diltiazem (CARDIZEM) 30 MG tablet Take 1 Tablet Every 4 Hours As Needed For Afib HR Over 100. If using frequently please call our office [phone] 30 tablet 1   metoprolol succinate (TOPROL-XL) 100 MG 24 hr tablet Take 1/2 to 3/4 tablet by mouth daily. Monitor your BP.  90 tablet 3   No facility-administered medications prior to visit.     Allergies  Allergen Reactions   Linezolid Other (See Comments)    Burning tongue    Vancomycin Rash    Social History   Tobacco Use   Smoking status: Never   Smokeless tobacco: Never  Vaping Use   Vaping Use: Never used  Substance Use Topics   Alcohol use: Yes    Alcohol/week: 2.0 standard drinks    Types: 2 Glasses of wine per week    Comment: 2 glasses of wine a week   Drug use: No    Family History  Problem Relation Age of Onset   Dementia Mother    Stroke Father  Atrial fibrillation Father    Hypertension Father    Hypertension Paternal Grandfather    Dementia Maternal Grandmother     Objective:   Vitals:   04/15/21 1603  BP: (!) 152/84  Pulse: (!) 53  Temp: 98.2 F (36.8 C)  TempSrc: Oral  SpO2: 97%  Weight: 260 lb (117.9 kg)   Body mass index is 37.31 kg/m.  Physical Exam Constitutional:      Appearance: Normal appearance. He is not ill-appearing.  HENT:     Head: Normocephalic.     Mouth/Throat:     Mouth: Mucous membranes are moist.     Pharynx: Oropharynx is clear.     Comments: Tongue is clear. Pink and without any lesions or discoloration.  Eyes:     General: No scleral icterus. Cardiovascular:     Rate and Rhythm: Normal rate.  Pulmonary:     Effort: Pulmonary effort is normal.  Musculoskeletal:        General: Normal range of motion.     Cervical back: Normal range of motion.  Skin:    Coloration: Skin is not jaundiced or pale.     Findings: No bruising.  Neurological:     Mental Status: He is alert and oriented to person, place, and time.  Psychiatric:        Mood and Affect: Mood normal.        Judgment: Judgment normal.    Assessment & Plan:   Problem List Items Addressed This Visit       Unprioritized   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[place]) - Primary    Bill has done better on suppressive doxycycline and previous s/e to linezolid  seemed to have resolved. His inflammatory markers have resolved to normal now since his 2-stage. We talked about risk / benefit of stopping antibiotics after 3 month period of suppression. While we have no perfect test of cure I think [name] would be a good candidate to stop suppressive antibiotics and follow clinically for any relapse of infection related to his prosthesis. He seems to have had excellent surgical care and has healed nicely.  Will leave further antibiotic duration to Dr. Aurea Graff discretion at his next appointment.  Will check CBC and CRP again to ensure no concern here with stopping.       Relevant Orders   C-reactive protein (Completed)   CBC (Completed)   Janene [name], MSN, NP-[facility] Pager: [phone] Office: [phone]  05/29/21  2:50 PM

## 2021-04-16 ENCOUNTER — Inpatient Hospital Stay (HOSPITAL_BASED_OUTPATIENT_CLINIC_OR_DEPARTMENT_OTHER): Payer: Medicare Other | Admitting: Hematology and Oncology

## 2021-04-16 DIAGNOSIS — Z7901 Long term (current) use of anticoagulants: Secondary | ICD-10-CM | POA: Diagnosis not present

## 2021-04-16 DIAGNOSIS — D472 Monoclonal gammopathy: Secondary | ICD-10-CM | POA: Diagnosis not present

## 2021-04-16 DIAGNOSIS — Z881 Allergy status to other antibiotic agents status: Secondary | ICD-10-CM | POA: Diagnosis not present

## 2021-04-16 DIAGNOSIS — Z79899 Other long term (current) drug therapy: Secondary | ICD-10-CM | POA: Diagnosis not present

## 2021-04-16 LAB — CBC
HCT: 43 % (ref 38.5–50.0)
Hemoglobin: 14.5 g/dL (ref 13.2–17.1)
MCH: 32.2 pg (ref 27.0–33.0)
MCHC: 33.7 g/dL (ref 32.0–36.0)
MCV: 95.6 fL (ref 80.0–100.0)
MPV: 11 fL (ref 7.5–12.5)
Platelets: 257 10*3/uL (ref 140–400)
RBC: 4.5 10*6/uL (ref 4.20–5.80)
RDW: 15.5 % — ABNORMAL HIGH (ref 11.0–15.0)
WBC: 8 10*3/uL (ref 3.8–10.8)

## 2021-04-16 LAB — C-REACTIVE PROTEIN: CRP: 3.1 mg/L (ref <8.0)

## 2021-04-16 MED ORDER — METOPROLOL SUCCINATE ER 50 MG PO TB24: 50.0000 mg | ORAL_TABLET | Freq: Every day | ORAL | 3 refills | Status: DC

## 2021-04-16 MED ORDER — DILTIAZEM HCL 30 MG PO TABS: 30.0000 mg | ORAL_TABLET | Freq: Three times a day (TID) | ORAL | 3 refills | Status: DC

## 2021-04-16 NOTE — Assessment & Plan Note (Signed)
03/21/2019:Kappa268 (was 112:9 months ago), lambda 10.3, ratio 26.07 (it was 15.18), M protein 0.4 g (was 0.6 g)Hemoglobin 14.9, calcium 9.2, creatinine 0.87 03/25/2020: Kappa 623, Lambda 8.9, Ratio: 72, Hb 14, Cr 0.88, Calcium: 9.5, LDH 265, M Protein 0.2 gm.  05/05/2020: Hemoglobin 14, Kappa 598, lambda 8.2, ratio 72.9, creatinine 0.76, IgG 587, IgA 441, M protein 0.3 g  04/15/2021: Hemoglobin 14.5, Kappa 580, lambda 7, ratio 82.9, creatinine 0.88, calcium 9, IgG 534, IgA: 386 M protein 0.3 g IgA kappa  Bone marrow biopsy 05/05/2020: Hypercellular bone marrow (50%) for age with plasma cells 14% display kappa light chain restriction, FISH pending, cytogenetics: Normal 8 XY  there are any signs or symptoms of myeloma. Patient will continue to follow with Dr. Baltazar Najjar. I will see him back in 1 year for follow-up

## 2021-04-17 DIAGNOSIS — M6281 Muscle weakness (generalized): Secondary | ICD-10-CM | POA: Diagnosis not present

## 2021-04-17 DIAGNOSIS — M5459 Other low back pain: Secondary | ICD-10-CM | POA: Diagnosis not present

## 2021-04-20 ENCOUNTER — Encounter: Payer: Medicare Other | Attending: Geriatric Medicine | Admitting: Dietician

## 2021-04-20 ENCOUNTER — Other Ambulatory Visit: Payer: Self-pay

## 2021-04-20 DIAGNOSIS — M5459 Other low back pain: Secondary | ICD-10-CM | POA: Diagnosis not present

## 2021-04-20 DIAGNOSIS — M6281 Muscle weakness (generalized): Secondary | ICD-10-CM | POA: Diagnosis not present

## 2021-04-20 DIAGNOSIS — R7303 Prediabetes: Secondary | ICD-10-CM

## 2021-04-20 NOTE — Progress Notes (Signed)
On 04/20/2021 patient completed a post core session of the Diabetes Prevention Program course virtually with [facility]. By the end of this session patients are able to complete the following objectives:   Virtual Visit via Video Note  I connected with Satira Anis. Werts, 29-Aug-1950 by a video enabled application and verified that I am speaking with the correct person using two identifiers.  Location: Patient: Virtual Provider: Office  Learning Objectives: Counter self-defeating thoughts with positive self-statements Define assertiveness.  List examples of ways to practice assertiveness.   Goals:  Record weight taken outside of class.  Track foods and beverages eaten each day in the "Food and Activity Tracker," including calories and fat grams for each item.   Track activity type, minutes you were active, and distance you reached each day in the "Food and Activity Tracker."   Follow-Up Plan: Attend next session.  Email completed "Food and Activity Trackers" before next session to be reviewed by [facility].

## 2021-04-21 DIAGNOSIS — M65322 Trigger finger, left index finger: Secondary | ICD-10-CM | POA: Diagnosis not present

## 2021-04-29 DIAGNOSIS — M25551 Pain in right hip: Secondary | ICD-10-CM | POA: Diagnosis not present

## 2021-04-29 DIAGNOSIS — M5459 Other low back pain: Secondary | ICD-10-CM | POA: Diagnosis not present

## 2021-04-29 DIAGNOSIS — M6281 Muscle weakness (generalized): Secondary | ICD-10-CM | POA: Diagnosis not present

## 2021-05-01 DIAGNOSIS — M6281 Muscle weakness (generalized): Secondary | ICD-10-CM | POA: Diagnosis not present

## 2021-05-01 DIAGNOSIS — M5459 Other low back pain: Secondary | ICD-10-CM | POA: Diagnosis not present

## 2021-05-08 ENCOUNTER — Other Ambulatory Visit: Payer: Medicare Other

## 2021-05-15 ENCOUNTER — Ambulatory Visit: Payer: Medicare Other | Admitting: Hematology and Oncology

## 2021-05-18 ENCOUNTER — Encounter: Payer: Medicare Other | Attending: Geriatric Medicine | Admitting: Dietician

## 2021-05-18 ENCOUNTER — Other Ambulatory Visit: Payer: Self-pay

## 2021-05-18 DIAGNOSIS — R7303 Prediabetes: Secondary | ICD-10-CM | POA: Diagnosis not present

## 2021-05-18 NOTE — Progress Notes (Signed)
On 05/18/2021 pt completed a post core session of the Diabetes Prevention Program course virtually with [facility]. By the end of this session patients are able to complete the following objectives:   Virtual Visit via Video Note  I connected with Travis [name], [birth year] by a video enabled application and verified that I am speaking with the correct person using two identifiers.  Location: Patient: Virtual Provider: Office  Learning Objectives: Reflect on lifestyle changes they have made since starting the DPP.  Set long-term goals to promote continued maintenance of lifestyle changes made during the program.   Goals:  Work toward reaching new long-term goals set during class.   Follow-Up Plan: Contact Lifestyle Coach with questions/concerns PRN.

## 2021-05-19 DIAGNOSIS — M25551 Pain in right hip: Secondary | ICD-10-CM | POA: Diagnosis not present

## 2021-05-25 DIAGNOSIS — M25551 Pain in right hip: Secondary | ICD-10-CM | POA: Diagnosis not present

## 2021-05-29 NOTE — Assessment & Plan Note (Signed)
Travis [name] has done better on suppressive doxycycline and previous s/e to linezolid seemed to have resolved. His inflammatory markers have resolved to normal now since his 2-stage. We talked about risk / benefit of stopping antibiotics after 3 month period of suppression. While we have no perfect test of cure I think Travis [name] would be a good candidate to stop suppressive antibiotics and follow clinically for any relapse of infection related to his prosthesis. He seems to have had excellent surgical care and has healed nicely.  Will leave further antibiotic duration to Dr. Aurea Graff discretion at his next appointment.  Will check CBC and CRP again to ensure no concern here with stopping.

## 2021-06-05 DIAGNOSIS — M5459 Other low back pain: Secondary | ICD-10-CM | POA: Diagnosis not present

## 2021-06-08 DIAGNOSIS — M67962 Unspecified disorder of synovium and tendon, left lower leg: Secondary | ICD-10-CM | POA: Diagnosis not present

## 2021-06-08 DIAGNOSIS — M19072 Primary osteoarthritis, left ankle and foot: Secondary | ICD-10-CM | POA: Diagnosis not present

## 2021-06-08 DIAGNOSIS — M25551 Pain in right hip: Secondary | ICD-10-CM | POA: Diagnosis not present

## 2021-06-10 DIAGNOSIS — G4733 Obstructive sleep apnea (adult) (pediatric): Secondary | ICD-10-CM | POA: Diagnosis not present

## 2021-06-11 DIAGNOSIS — M25551 Pain in right hip: Secondary | ICD-10-CM | POA: Diagnosis not present

## 2021-06-15 DIAGNOSIS — M25551 Pain in right hip: Secondary | ICD-10-CM | POA: Diagnosis not present

## 2021-06-18 DIAGNOSIS — M65322 Trigger finger, left index finger: Secondary | ICD-10-CM | POA: Diagnosis not present

## 2021-06-18 DIAGNOSIS — M25551 Pain in right hip: Secondary | ICD-10-CM | POA: Diagnosis not present

## 2021-06-19 ENCOUNTER — Telehealth: Payer: Self-pay | Admitting: *Deleted

## 2021-06-19 NOTE — Telephone Encounter (Signed)
° °  Pre-operative Risk Assessment    Patient Name: Travis [name]  DOB: 05-Apr-1951 MRN: [id number]      Request for Surgical Clearance    Procedure:   FINGER TENOLYSIS AND PARTIAL FDS TENDON  RESECTION  Date of Surgery:  Clearance TBD                                 Surgeon:  DR. Esmond [name] Surgeon's Group or Practice Name:  Trav[name] Phone number:  [phone] Fax number:  [phone] ATTN: Travis [name]   Type of Clearance Requested:   - Medical  - Pharmacy:  Hold Apixaban (Eliquis)     Type of Anesthesia:   CHOICE   Additional requests/questions:    Travis [name]   06/19/2021, 5:08 PM

## 2021-06-22 NOTE — Telephone Encounter (Signed)
Patient with diagnosis of afib on Eliquis for anticoagulation.    Procedure: FINGER TENOLYSIS AND PARTIAL FDS TENDON  RESECTION Date of procedure: TBD  CHA2DS2-VASc Score = 2  This indicates a 2.2% annual risk of stroke. The patient's score is based upon: CHF History: 0 HTN History: 1 Diabetes History: 0 Stroke History: 0 Vascular Disease History: 0 Age Score: 1 Gender Score: 0  Also with hx of DVT.  CrCl 140mL/min using adjusted body weight Platelet count 257K  Per office protocol, patient can hold Eliquis for 2 days prior to procedure.

## 2021-06-23 NOTE — Telephone Encounter (Signed)
Fax# [phone], clearance has been sent.

## 2021-06-23 NOTE — Telephone Encounter (Addendum)
° °  Name: Travis [name]  DOB: 1950-08-04  MRN: [id number]   Primary Cardiologist: [name], MD  Chart reviewed as part of pre-operative protocol coverage. Patient was contacted 06/23/2021 in reference to pre-operative risk assessment for pending surgery as outlined below.  SAHIL [name] was last seen on 03/12/2021 by Dr. [name]. Since that day, Travis [name] has done well from a cardiac standpoint. He does have stable chronic bilateral lower extremity edema (mostly dependent edema) that waxes and wanes, for which he takes furosemide. He reports a decrease in palpitations since his last visit.   According to the Revised Cardiac Risk Index (RCRI), his perioperative risk of major cardiac event is 0.4%. His METS are 7.99 according to the Duke Activity Status Index (DASI).  Therefore, based on ACC/AHA guidelines, the patient would be at acceptable risk for the planned procedure without further cardiovascular testing.   The patient was advised that if he develops new symptoms prior to surgery to contact our office to arrange for a follow-up visit, and he verbalized understanding.  Patient with diagnosis of afib on Eliquis for anticoagulation.     Procedure: FINGER TENOLYSIS AND PARTIAL FDS TENDON  RESECTION Date of procedure: TBD   CHA2DS2-VASc Score = 2  This indicates a 2.2% annual risk of stroke. The patient's score is based upon: CHF History: 0 HTN History: 1 Diabetes History: 0 Stroke History: 0 Vascular Disease History: 0 Age Score: 1 Gender Score: 0   Also with hx of DVT.   CrCl 156mL/min using adjusted body weight Platelet count 257K   Per office protocol, patient can hold Eliquis for 2 days prior to procedure.  I will route this recommendation to pre-op callback division to clarify fax number as it is incomplete below.   Lenna Sciara, NP 06/23/2021, 11:24 AM

## 2021-07-02 ENCOUNTER — Telehealth: Payer: Self-pay | Admitting: Internal Medicine

## 2021-07-02 DIAGNOSIS — R7303 Prediabetes: Secondary | ICD-10-CM | POA: Diagnosis not present

## 2021-07-02 DIAGNOSIS — R0789 Other chest pain: Secondary | ICD-10-CM | POA: Diagnosis not present

## 2021-07-02 NOTE — Telephone Encounter (Signed)
Pt c/o swelling: STAT is pt has developed SOB within 24 hours ? ?How much weight have you gained and in what time span?  ?Patient assumes he gained weight, but no log of daily weights  ? ?If swelling, where is the swelling located?  ?Ankles and feet ? ?Are you currently taking a fluid pill?  ?HCTZ and Lasix ? ?Are you currently SOB?  ?No  ? ?Do you have a log of your daily weights (if so, list)?  ?No ? ?Have you gained 3 pounds in a day or 5 pounds in a week?  ?No  ? ?Have you traveled recently?  ?No  ? ?

## 2021-07-02 NOTE — Telephone Encounter (Signed)
Returned patient's call. ? ?Patient states he does not currently have any swelling in feet/ankles/abdomen. He states he took Lasix 40mg  yesterday. He states his weight is stable, did not have any recordings to give. He states his BP is generally around 140/78, HR upper 50's-60's.  ? ?Patient denies any SOB, dizziness, chest pain. Patient states he has chest soreness that is constant around the whole chest. He states this began Sunday after playing golf. He also reports having some PVCs Sunday evening, denies feeling any palpitations currently. ? ?Patient has an appointment scheduled with [name] for 07/13/21. Advised patient that if he feels he needs to be evaluated sooner he can go to and urgent care center. Also advised patient of ED precautions. Patient verbalized understanding. ? ?Will forward to Dr. Harle Stanford, RN for review. ?

## 2021-07-03 NOTE — Telephone Encounter (Signed)
Spoke with the pt and he reports that he went to the urgent care yesterday and he says that his "assessment" came out okay and he has had some improvement .. he says the discomfort is where he had a cyst removed a few years ago and feels he may have pulled some scar tissue when he was golfing... I advised him that we can bring him in sooner 07/08/21 but he declined and says he will continue to monitor and let me know if he needs a sooner appt early next week.  ?

## 2021-07-06 DIAGNOSIS — M24542 Contracture, left hand: Secondary | ICD-10-CM | POA: Diagnosis not present

## 2021-07-06 DIAGNOSIS — M67844 Other specified disorders of tendon, left hand: Secondary | ICD-10-CM | POA: Diagnosis not present

## 2021-07-12 NOTE — Progress Notes (Unsigned)
a   Cardiology Office Note   Date:  07/12/2021   ID:  Travis [name], Travis [name] [birth year], MRN [id number]  PCP:  Lajean Manes, MD  Cardiologist:   [name], MD    Patient presents for follow up of HTN, PAF and palpitations     History of Present Illness: Travis [name] is a 71 y.o. male with a history of  HTN, OSA and PAF (CHADSVASc score 4; s/p Afib ablation 2018  On flecanide).   The pt had Cardiac CTA in 2018 which showed no CAD  Calcium score was 0  Pt also has hx of DVT, peripheral neuropathy and MGUS I last saw the patient in clinic back in June 2022.  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  Summer was followed by ID and orthopedics.  The patient called in recently complaining of increased palpitations thought that they were PVCs.  I set him up for an event monitor.  After he picked up the monitor he said the skips improved.  He denies dizziness.  He denies chest pain.  He denies symptoms of atrial fibrillation.  He continues to follow with orthopedics.  Having problems with his feet.  Monitor showed SR   AVerag HR 53   I cut back on toprol to 75 mg   Had rare PACs, PVCss  I saw the patient in Nov 2022  Went to URgent care      Sinc last week the ankles got better  Patient had pains and sorness in L chest   Sitting or with activity   Last 10 to 14 daiyys weird   Urgent care   Blood work   Nothing     Mon night  surgery      Block on arm   Not gener Tues bad afib Lasted about 1 to 1.5 hours   Took diltiazem    Took a few houlrs Wed  Chest flet sore  Thurs   to yestderday   Sore A llittle lighthea  Not positional  Not pleuritic    ? Bendopnea    No outpatient medications have been marked as taking for the 07/13/21 encounter (Appointment) with Fay Records, MD.     Allergies:   Linezolid and Vancomycin   Past Medical History:  Diagnosis Date   Arthritis    "comes and goes; mostly in hands, occasionally elbow" (52/12/4130)    Complication of anesthesia    "just wanted to sleep alot  for couple days S/P VARICOCELE OR" (04/05/2017)   DVT (deep venous thrombosis) (HCC)    LLE   Dysrhythmia    PVCs    GERD (gastroesophageal reflux disease)    History of hiatal hernia    Hypertension    Impaired glucose tolerance    Myeloma ([place])    Obesity (BMI 30-39.9) 07/26/2016   OSA on CPAP 07/26/2016   Mild with AHI 12.5/hr now on CPAP at 14cm H2O   PAF (paroxysmal atrial fibrillation) ([place])    s/p PVI Ablation in 2018 // Flecainide Rx // Echo 8/21: EF 60-65, no RWMA, mild LVH, normal RV SF, moderate RVE, mild BAE, trivial MR, mild dilation of aortic root (40 mm)   Pneumonia    "may have had walking pneumonia a few years ago" (04/05/2017)   Pre-diabetes    Thrombophlebitis     Past Surgical History:  Procedure Laterality Date   ATRIAL FIBRILLATION ABLATION  04/05/2017   ATRIAL FIBRILLATION ABLATION N/A 04/05/2017  Procedure: ATRIAL FIBRILLATION ABLATION;  Surgeon: Thompson Grayer, MD;  Location: [facility];  Service: Cardiovascular;  Laterality: N/A;   COMPLETE RIGHT HIP REPLACMENT  01/19/2019   EVALUATION UNDER ANESTHESIA WITH HEMORRHOIDECTOMY N/A 02/24/2017   Procedure: EXAM UNDER ANESTHESIA WITH  HEMORRHOIDECTOMY;  Surgeon: Michael Boston, MD;  Location: [facility];  Service: General;  Laterality: N/A;   EXCISIONAL TOTAL HIP ARTHROPLASTY WITH ANTIBIOTIC SPACERS Right 09/25/2020   Procedure: EXCISIONAL TOTAL HIP ARTHROPLASTY WITH ANTIBIOTIC SPACERS;  Surgeon: Paralee Cancel, MD;  Location: [facility];  Service: Orthopedics;  Laterality: Right;   FLEXIBLE SIGMOIDOSCOPY N/A 04/15/2015   Procedure:  UNSEDATED FLEXIBLE SIGMOIDOSCOPY;  Surgeon: [name], MD;  Location: [facility];  Service: Endoscopy;  Laterality: N/A;   KNEE ARTHROSCOPY Left ~ 2016   REIMPLANTATION OF TOTAL HIP Right 12/25/2020   Procedure: REIMPLANTATION/REVISION OF RIGHT TOTAL HIP VERSUS REPEAT IRRIGATION AND DEBRIDEMENT;  Surgeon: Paralee Cancel, MD;   Location: [facility];  Service: Orthopedics;  Laterality: Right;   TESTICLE SURGERY  1988   VARICOCELE   TONSILLECTOMY     VARICOSE VEIN SURGERY Bilateral      Social History:  The patient  reports that he has never smoked. He has never used smokeless tobacco. He reports current alcohol use of about 2.0 standard drinks per week. He reports that he does not use drugs.   Family History:  The patient's family history includes Atrial fibrillation in his father; Dementia in his maternal grandmother and mother; Hypertension in his father and paternal grandfather; Stroke in his father.    ROS:  Please see the history of present illness. All other systems are reviewed and  Negative to the above problem except as noted.    PHYSICAL EXAM: VS:  There were no vitals taken for this visit.  GEN: Morbidly obese 71 yo in no acute distress  HEENT: normal  Neck: no JVD, Cardiac: RRR; no murmurs   edema  Respiratory:  clear to auscultation bilaterally,   GI: soft, nontender, nondistended, + BS   MS: no deformity Moving all extremities   Skin: warm and dry   Diffuse maculopapular rash   Neuro:  Deferred  EKG:  EKG is not ordered     Lipid Panel No results found for: CHOL, TRIG, HDL, CHOLHDL, VLDL, LDLCALC, LDLDIRECT    Wt Readings from Last 3 Encounters:  04/16/21 257 lb 12.8 oz (116.9 kg)  04/15/21 260 lb (117.9 kg)  03/12/21 258 lb 12.8 oz (117.4 kg)      ASSESSMENT AND PLAN: 1.  Palpitations.  Monitor showed rare PACs and PVCs.  One short burst of SVT.  Otherwise sinus rhythm average heart rate was 53.  Recommendation: I would continue to follow.  He could try backing down to 75 daily of his Toprol seen with increase in average heart rate a little he may feel palpitations less.  He actually says he is not bothered right now  2.  To history of PAF no recurrence on monitor.  No symptomatic recurrence.  Continue on current regimen except changes as noted  3 lower extremity edema.  Improved  with Lasix every other day.  He is actually taking 2 times per week.  4.  Hypertension.  We will need to follow this especially with changes.  5.  Lipids.  LDL 61, HDL 46.  Follow.  6.  OSA.  Followed by [facility].  Continue CPAP  7.  Morbid obesity discussed low carbs.  He is actually eating low-fat.  He knows he needs to cut back on things  I will set to see the patient back in March, sooner for problems.  He will MyChart in with blood pressures when he makes a change. 1      Current medicines are reviewed at length with the patient today.  The patient does not have concerns regarding medicines.  Signed, [name], MD  07/12/2021 8:36 PM    [address], [place], [place]  [address] Phone: [phone]; Fax: [phone]

## 2021-07-13 ENCOUNTER — Encounter: Payer: Self-pay | Admitting: Internal Medicine

## 2021-07-13 ENCOUNTER — Ambulatory Visit: Payer: Medicare Other | Admitting: Internal Medicine

## 2021-07-13 ENCOUNTER — Other Ambulatory Visit: Payer: Self-pay

## 2021-07-13 VITALS — BP 126/70 | HR 56 | Ht 70.0 in | Wt 259.0 lb

## 2021-07-13 DIAGNOSIS — I480 Paroxysmal atrial fibrillation: Secondary | ICD-10-CM | POA: Diagnosis not present

## 2021-07-13 NOTE — Patient Instructions (Signed)
Medication Instructions:  ?Lasix and K every other day for 4 doses and then let us know how you are doing  ?*If you need a refill on your cardiac medications before your next appointment, please call your pharmacy* ? ? ?Lab Work: ?none ?If you have labs (blood work) drawn today and your tests are completely normal, you will receive your results only by: ?MyChart Message (if you have MyChart) OR ?A paper copy in the mail ?If you have any lab test that is abnormal or we need to change your treatment, we will call you to review the results. ? ? ?Testing/Procedures: ?none ? ? ?Follow-Up: ?At [facility], you and your health needs are our priority.  As part of our continuing mission to provide you with exceptional heart care, we have created designated Provider Care Teams.  These Care Teams include your primary Cardiologist (physician) and Advanced Practice Providers (APPs -  Physician Assistants and Nurse Practitioners) who all work together to provide you with the care you need, when you need it. ? ?We recommend signing up for the patient portal called "MyChart".  Sign up information is provided on this After Visit Summary.  MyChart is used to connect with patients for Virtual Visits (Telemedicine).  Patients are able to view lab/test results, encounter notes, upcoming appointments, etc.  Non-urgent messages can be sent to your provider as well.   ?To learn more about what you can do with MyChart, go to [email or web address].   ? ?Your next appointment:   ?5 month(s) ? ?The format for your next appointment:   ?In Person ? ?Provider:   ?[name], MD   ? ? ?Other Instructions ?  ?

## 2021-07-14 DIAGNOSIS — C9000 Multiple myeloma not having achieved remission: Secondary | ICD-10-CM | POA: Diagnosis not present

## 2021-07-14 DIAGNOSIS — R7303 Prediabetes: Secondary | ICD-10-CM | POA: Diagnosis not present

## 2021-07-14 DIAGNOSIS — I480 Paroxysmal atrial fibrillation: Secondary | ICD-10-CM | POA: Diagnosis not present

## 2021-07-14 DIAGNOSIS — M25642 Stiffness of left hand, not elsewhere classified: Secondary | ICD-10-CM | POA: Diagnosis not present

## 2021-07-14 DIAGNOSIS — I10 Essential (primary) hypertension: Secondary | ICD-10-CM | POA: Diagnosis not present

## 2021-07-21 DIAGNOSIS — M25371 Other instability, right ankle: Secondary | ICD-10-CM | POA: Diagnosis not present

## 2021-07-21 DIAGNOSIS — M25642 Stiffness of left hand, not elsewhere classified: Secondary | ICD-10-CM | POA: Diagnosis not present

## 2021-07-30 ENCOUNTER — Other Ambulatory Visit: Payer: Self-pay | Admitting: Internal Medicine

## 2021-07-30 DIAGNOSIS — M25642 Stiffness of left hand, not elsewhere classified: Secondary | ICD-10-CM | POA: Diagnosis not present

## 2021-08-05 DIAGNOSIS — D485 Neoplasm of uncertain behavior of skin: Secondary | ICD-10-CM | POA: Diagnosis not present

## 2021-08-05 DIAGNOSIS — I872 Venous insufficiency (chronic) (peripheral): Secondary | ICD-10-CM | POA: Diagnosis not present

## 2021-08-05 DIAGNOSIS — L728 Other follicular cysts of the skin and subcutaneous tissue: Secondary | ICD-10-CM | POA: Diagnosis not present

## 2021-08-05 DIAGNOSIS — L2089 Other atopic dermatitis: Secondary | ICD-10-CM | POA: Diagnosis not present

## 2021-08-05 DIAGNOSIS — L989 Disorder of the skin and subcutaneous tissue, unspecified: Secondary | ICD-10-CM | POA: Diagnosis not present

## 2021-08-07 DIAGNOSIS — D485 Neoplasm of uncertain behavior of skin: Secondary | ICD-10-CM | POA: Diagnosis not present

## 2021-08-10 ENCOUNTER — Encounter: Payer: Self-pay | Admitting: Neurology

## 2021-08-10 ENCOUNTER — Ambulatory Visit: Payer: Medicare Other | Admitting: Neurology

## 2021-08-10 VITALS — BP 149/74 | HR 57 | Ht 70.0 in | Wt 260.0 lb

## 2021-08-10 DIAGNOSIS — M4802 Spinal stenosis, cervical region: Secondary | ICD-10-CM | POA: Diagnosis not present

## 2021-08-10 DIAGNOSIS — G63 Polyneuropathy in diseases classified elsewhere: Secondary | ICD-10-CM | POA: Diagnosis not present

## 2021-08-10 DIAGNOSIS — D472 Monoclonal gammopathy: Secondary | ICD-10-CM

## 2021-08-10 NOTE — Progress Notes (Signed)
? ? ?Follow-up Visit ? ? ?Date: 08/10/21 ? ? ?Travis [name] ?MRN: [id number] ?DOB: 12-13-1950 ? ? ?Interim History: ?Travis [name] is a 71 y.o. with history of atrial fibrillation s/p ablation (on eliquis), OSA on CPAP, GERD, smoldering myeloma, alcohol use, and hypertension returning to the clinic for follow-up of neuropathy and cervical canal stenosis.  The patient was accompanied to the clinic by self. ? ?He has no new neurological complaints today. Neuropathy is unchanged and remains in the feet.  He has no significant change in his feet pain, which is controlled on gabapentin '600mg'$  BID.  He is also seeing a chronic pain clinic who recommended diet changes and other various therapies to his feet, which have not been very beneficial.   ? ?He denies arm weakness or paresthesias. . ? ?He has been busy with other medical issues including and has underwent right hip surgery, left index finger surgery, right facial surgery.  ? ? ?Medications:  ?Current Outpatient Medications on File Prior to Visit  ?Medication Sig Dispense Refill  ? amLODipine (NORVASC) 10 MG tablet Take 10 mg by mouth daily.     ? apixaban (ELIQUIS) 5 MG TABS tablet TAKE ONE TABLET BY MOUTH TWICE A DAY 60 tablet 5  ? b complex vitamins tablet Take 1 tablet by mouth daily.    ? cholecalciferol (VITAMIN D3) 25 MCG (1000 UNIT) tablet Take 1,000 Units by mouth daily.    ? clobetasol cream (TEMOVATE) 0.05 % Apply topically.    ? diltiazem (CARDIZEM) 30 MG tablet Take 1 tablet (30 mg total) by mouth 3 (three) times daily. 270 tablet 3  ? flecainide (TAMBOCOR) 50 MG tablet TAKE ONE AND ONE-HALF TABLETS BY MOUTH TWICE A DAY 270 tablet 3  ? fluticasone (FLONASE) 50 MCG/ACT nasal spray Place 1 spray into both nostrils daily as needed for allergies or rhinitis.    ? folic acid (FOLVITE) 1 MG tablet Take 1 mg by mouth daily.    ? furosemide (LASIX) 40 MG tablet Take 40 mg by mouth twice a week. (Patient taking differently: Take '40mg'$  every other day) 40  tablet 1  ? gabapentin (NEURONTIN) 300 MG capsule TAKE TWO CAPSULES BY MOUTH TWICE A DAY 180 capsule 3  ? hydrochlorothiazide (HYDRODIURIL) 25 MG tablet Take 1 tablet (25 mg total) by mouth daily. 90 tablet 3  ? lisinopril (ZESTRIL) 40 MG tablet TAKE ONE TABLET BY MOUTH ONE TIME DAILY 90 tablet 3  ? metoprolol succinate (TOPROL-XL) 50 MG 24 hr tablet Take 1 tablet (50 mg total) by mouth daily. Take with or immediately following a meal. 90 tablet 3  ? oxymetazoline (AFRIN) 0.05 % nasal spray Place 1 spray into both nostrils 2 (two) times daily as needed for congestion.    ? potassium chloride SA (KLOR-CON) 20 MEQ tablet Take 1 tablet (20 mEq total) by mouth daily. Take 40 mg on the Days that you take Lasix 135 tablet 3  ? sodium chloride (OCEAN) 0.65 % SOLN nasal spray Place 1 spray into both nostrils as needed for congestion.    ? triamcinolone cream (KENALOG) 0.1 % Apply 1 application topically daily as needed (itching).    ? docusate sodium (COLACE) 100 MG capsule Take 1 capsule (100 mg total) by mouth 2 (two) times daily. (Patient not taking: Reported on 07/13/2021) 10 capsule 0  ? polyethylene glycol (MIRALAX / GLYCOLAX) 17 g packet Take 17 g by mouth daily as needed for mild constipation. (Patient not taking: Reported on 07/13/2021) 14  each 0  ? ?No current facility-administered medications on file prior to visit.  ? ? ?Allergies:  ?Allergies  ?Allergen Reactions  ? Linezolid Other (See Comments)  ?  Burning tongue   ? Vancomycin Rash  ? ? ?Vital Signs:  ?BP (!) 149/74   Pulse (!) 57   Ht '5\' 10"'$  (1.778 m)   Wt 260 lb (117.9 kg)   SpO2 96%   BMI 37.31 kg/m?  ? ? ?Neurological Exam: ?MENTAL STATUS including orientation to time, place, person, recent and remote memory, attention span and concentration, language, and fund of knowledge is normal.  Speech is not dysarthric. ? ?CRANIAL NERVES:   Pupils equal round and reactive.  Normal conjugate, extra-ocular eye movements in all directions of gaze.  No ptosis   ? ?MOTOR:  Motor strength is 5/5 in all extremities. He is unable to flex the left index DIP due to surgery.  No atrophy, fasciculations or abnormal movements.  No pronator drift.  Tone is normal.   ? ?MSRs:  ?                                         Right        Left ?brachioradialis 3+  3+  ?biceps 2+  2+  ?triceps 2+  2+  ?patellar 2+  2+  ?ankle jerk 0  0  ? ?SENSORY:  Vibration absent at the ankles bilaterally, intact at the knees and MCP. ? ?COORDINATION/GAIT:  Gait is mildly wide-based due to body habitus, unassisted and stable. ? ?Data: ?Lab Results  ?Component Value Date  ? FUXNATFT73 329 05/13/2017  ? ?Lab Results  ?Component Value Date  ? FOLATE 19.1 05/10/2019  ? ?MRI lumbar spine wwo contrast 10/12/2017: ?1. Lumbar spondylosis and degenerative disc disease causing mild  ?impingement at L4-5 and L5-S1.  ?2. There is some marrow heterogeneity but no focal enhancing lesion  ?characteristic of lumbar myeloma is identified.  ?3. Mild levoconvex lumbar scoliosis.  ? ?MRI lumbar spine wo contrast 04/23/2020: ?1. No acute abnormality or significant change from prior MRI. ?2. Mild multilevel degenerative changes of the lumbar spine with mild narrowing of the bilateral subarticular zones at L4-5. ?3. No significant spinal canal or neural foraminal stenosis at any Level. ? ?MRI cervical spine 03/06/2020: ?Left foraminal narrowing C2-3 and C3-4 ?  ?Moderate spinal stenosis C4-5. Moderate foraminal stenosis bilaterally right greater than left due to spurring ?  ?Mild spinal stenosis C5-6 with mild foraminal stenosis bilaterally. ? ?NCS/EMG of the arms 04/04/2020: ?Chronic C5 radiculopathy affecting bilateral upper extremities, mild. ?Right median neuropathy at or distal to the wrist (mild), consistent with a clinical diagnosis of carpal tunnel syndrome.   ? ? ?IMPRESSION/PLAN: ?1.  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  Clinically with stable symptoms, no further  progression. ? - Continue gabapentin '600mg'$  BID for pain ? - Patient educated on daily foot inspection, fall prevention, and safety precautions around the home. ? ?2.  Cervical canal stenosis at C4-5, moderate with biforaminal stenosis.  Clinically doing well with no arm weakness or neck pain. ? - Continue to monitor ? ?3.  Right carpal tunnel syndrome, mild, asymptomatic ? ?Return to clinic in 1 year ? ?Thank you for allowing me to participate in patient's care.  If I can answer any additional questions, I would be pleased to do so.   ? ?Sincerely, ? ? ? ?  Marquon Alcala K. Posey Pronto, DO ? ? ?

## 2021-08-10 NOTE — Patient Instructions (Signed)
Return to clinic in 1 year.

## 2021-08-12 ENCOUNTER — Other Ambulatory Visit: Payer: Self-pay | Admitting: Internal Medicine

## 2021-08-12 NOTE — Telephone Encounter (Signed)
Pt last saw Dr Harrington Challenger 07/13/21, last labs 07/02/21 Creat 0.85, age 71, weight 117.9kg, based on specified criteria pt is on appropriate dosage of Eliquis '5mg'$  BID for afib.  Will refill rx.  ?

## 2021-08-18 DIAGNOSIS — M25642 Stiffness of left hand, not elsewhere classified: Secondary | ICD-10-CM | POA: Diagnosis not present

## 2021-08-25 DIAGNOSIS — G4733 Obstructive sleep apnea (adult) (pediatric): Secondary | ICD-10-CM | POA: Diagnosis not present

## 2021-08-27 DIAGNOSIS — M25642 Stiffness of left hand, not elsewhere classified: Secondary | ICD-10-CM | POA: Diagnosis not present

## 2021-08-31 DIAGNOSIS — Z872 Personal history of diseases of the skin and subcutaneous tissue: Secondary | ICD-10-CM | POA: Diagnosis not present

## 2021-09-07 DIAGNOSIS — M25642 Stiffness of left hand, not elsewhere classified: Secondary | ICD-10-CM | POA: Diagnosis not present

## 2021-09-08 DIAGNOSIS — J869 Pyothorax without fistula: Secondary | ICD-10-CM | POA: Diagnosis not present

## 2021-09-08 DIAGNOSIS — M546 Pain in thoracic spine: Secondary | ICD-10-CM | POA: Diagnosis not present

## 2021-09-10 ENCOUNTER — Other Ambulatory Visit: Payer: Self-pay | Admitting: Neurology

## 2021-09-10 DIAGNOSIS — D1801 Hemangioma of skin and subcutaneous tissue: Secondary | ICD-10-CM | POA: Diagnosis not present

## 2021-09-10 DIAGNOSIS — L821 Other seborrheic keratosis: Secondary | ICD-10-CM | POA: Diagnosis not present

## 2021-09-10 DIAGNOSIS — L814 Other melanin hyperpigmentation: Secondary | ICD-10-CM | POA: Diagnosis not present

## 2021-09-10 DIAGNOSIS — L570 Actinic keratosis: Secondary | ICD-10-CM | POA: Diagnosis not present

## 2021-09-11 DIAGNOSIS — M5451 Vertebrogenic low back pain: Secondary | ICD-10-CM | POA: Diagnosis not present

## 2021-10-02 DIAGNOSIS — M25642 Stiffness of left hand, not elsewhere classified: Secondary | ICD-10-CM | POA: Diagnosis not present

## 2021-10-08 DIAGNOSIS — M25642 Stiffness of left hand, not elsewhere classified: Secondary | ICD-10-CM | POA: Diagnosis not present

## 2021-10-09 DIAGNOSIS — M25551 Pain in right hip: Secondary | ICD-10-CM | POA: Diagnosis not present

## 2021-10-09 DIAGNOSIS — Z96641 Presence of right artificial hip joint: Secondary | ICD-10-CM | POA: Diagnosis not present

## 2021-10-12 DIAGNOSIS — M009 Pyogenic arthritis, unspecified: Secondary | ICD-10-CM | POA: Diagnosis not present

## 2021-10-13 DIAGNOSIS — Z4789 Encounter for other orthopedic aftercare: Secondary | ICD-10-CM | POA: Diagnosis not present

## 2021-10-13 DIAGNOSIS — M65322 Trigger finger, left index finger: Secondary | ICD-10-CM | POA: Diagnosis not present

## 2021-10-15 DIAGNOSIS — M25642 Stiffness of left hand, not elsewhere classified: Secondary | ICD-10-CM | POA: Diagnosis not present

## 2021-10-16 DIAGNOSIS — L814 Other melanin hyperpigmentation: Secondary | ICD-10-CM | POA: Diagnosis not present

## 2021-10-16 DIAGNOSIS — D2239 Melanocytic nevi of other parts of face: Secondary | ICD-10-CM | POA: Diagnosis not present

## 2021-10-16 DIAGNOSIS — D485 Neoplasm of uncertain behavior of skin: Secondary | ICD-10-CM | POA: Diagnosis not present

## 2021-10-20 DIAGNOSIS — M25642 Stiffness of left hand, not elsewhere classified: Secondary | ICD-10-CM | POA: Diagnosis not present

## 2021-10-21 ENCOUNTER — Encounter: Payer: Self-pay | Admitting: Infectious Diseases

## 2021-10-21 DIAGNOSIS — R208 Other disturbances of skin sensation: Secondary | ICD-10-CM | POA: Diagnosis not present

## 2021-10-21 DIAGNOSIS — L728 Other follicular cysts of the skin and subcutaneous tissue: Secondary | ICD-10-CM | POA: Diagnosis not present

## 2021-10-21 DIAGNOSIS — L723 Sebaceous cyst: Secondary | ICD-10-CM | POA: Diagnosis not present

## 2021-10-22 ENCOUNTER — Encounter: Payer: Self-pay | Admitting: Internal Medicine

## 2021-10-27 ENCOUNTER — Other Ambulatory Visit: Payer: Self-pay | Admitting: Internal Medicine

## 2021-10-27 ENCOUNTER — Ambulatory Visit
Admission: RE | Admit: 2021-10-27 | Discharge: 2021-10-27 | Disposition: A | Discharge status: Home / Self Care | Payer: Medicare Other | Source: Ambulatory Visit | Attending: Internal Medicine | Admitting: Internal Medicine

## 2021-10-27 DIAGNOSIS — R0781 Pleurodynia: Secondary | ICD-10-CM | POA: Diagnosis not present

## 2021-10-27 DIAGNOSIS — M25511 Pain in right shoulder: Secondary | ICD-10-CM | POA: Diagnosis not present

## 2021-10-28 ENCOUNTER — Other Ambulatory Visit: Payer: Self-pay | Admitting: Internal Medicine

## 2021-10-29 DIAGNOSIS — M25642 Stiffness of left hand, not elsewhere classified: Secondary | ICD-10-CM | POA: Diagnosis not present

## 2021-11-10 DIAGNOSIS — M25642 Stiffness of left hand, not elsewhere classified: Secondary | ICD-10-CM | POA: Diagnosis not present

## 2021-11-13 ENCOUNTER — Encounter: Payer: Self-pay | Admitting: Internal Medicine

## 2021-11-13 ENCOUNTER — Telehealth: Payer: Self-pay

## 2021-11-13 NOTE — Telephone Encounter (Signed)
Note plan by DOD   Contact patient this coming week re swelling  I could see him when I am in clinic again

## 2021-11-13 NOTE — Telephone Encounter (Signed)
Called patient to address concerns in my chart message. Patient reports increased edema in feet/ankles for last 10 days. He can get his shoes on but they are tight. Weight increased from 253lbs to 259lbs in 1 week. States he takes lasix 40 mg every other day. BP today is 146/68.  Reviewed with Dr. Angelena Form. Patient to take extra dose of lasix today and daily for 2-3 days to see in any improvement.   Also advised patient to weigh daily first thing in morning after before eating and keep a record of weights. Also advised to elevate legs and feet when sitting and avoid salty or high sodium foods.   Patient will update via call or my chart on how he is doing first of next week, 11/16/2021.

## 2021-11-14 ENCOUNTER — Other Ambulatory Visit: Payer: Self-pay | Admitting: Internal Medicine

## 2021-11-16 NOTE — Telephone Encounter (Signed)
Called to check in with the pt and he says he has been having a lot of fatigue and still having bilateral peripheral edema... I made him an appt to see Dr Harrington Challenger 11/17/21.

## 2021-11-16 NOTE — Progress Notes (Unsigned)
a   Cardiology Office Note   Date:  11/17/2021   ID:  Travis [name], DOB 1950/06/06, MRN [id number]  PCP:  Lajean Manes, MD  Cardiologist:   [name], MD    Patient presents for follow up of HTN, PAF and palpitations     History of Present Illness: Travis [name] is a 71 y.o. male with a history of  HTN, OSA and PAF (CHADSVASc score 4; s/p Afib ablation 2018  On flecanide).   The pt had Cardiac CTA in 2018 which showed no CAD  Calcium score was 0  Pt also has hx of DVT, peripheral neuropathy and MGUS  I saw the pt in clinic in March 2023  The pt said about 10 days ago he started getting swelling in his legs/feet   Denies change in diet   Avoids salt    Says he feels washed out at times     No frank dizziness    He went to taking lasix 40 every other day    Called in and took daily for a few days then qod     He has had LE swellng in the past    Allergies:   Linezolid and Vancomycin   Past Medical History:  Diagnosis Date   Arthritis    "comes and goes; mostly in hands, occasionally elbow" (40/01/8118)   Complication of anesthesia    "just wanted to sleep alot  for couple days S/P VARICOCELE OR" (04/05/2017)   DVT (deep venous thrombosis) (HCC)    LLE   Dysrhythmia    PVCs    GERD (gastroesophageal reflux disease)    History of hiatal hernia    Hypertension    Impaired glucose tolerance    Myeloma ([place])    Obesity (BMI 30-39.9) 07/26/2016   OSA on CPAP 07/26/2016   Mild with AHI 12.5/hr now on CPAP at 14cm H2O   PAF (paroxysmal atrial fibrillation) ([place])    s/p PVI Ablation in 2018 // Flecainide Rx // Echo 8/21: EF 60-65, no RWMA, mild LVH, normal RV SF, moderate RVE, mild BAE, trivial MR, mild dilation of aortic root (40 mm)   Pneumonia    "may have had walking pneumonia a few years ago" (04/05/2017)   Pre-diabetes    Thrombophlebitis     Past Surgical History:  Procedure Laterality Date   ATRIAL FIBRILLATION ABLATION  04/05/2017   ATRIAL FIBRILLATION  ABLATION N/A 04/05/2017   Procedure: ATRIAL FIBRILLATION ABLATION;  Surgeon: Thompson Grayer, MD;  Location: [facility];  Service: Cardiovascular;  Laterality: N/A;   COMPLETE RIGHT HIP REPLACMENT  01/19/2019   EVALUATION UNDER ANESTHESIA WITH HEMORRHOIDECTOMY N/A 02/24/2017   Procedure: EXAM UNDER ANESTHESIA WITH  HEMORRHOIDECTOMY;  Surgeon: Michael Boston, MD;  Location: [facility];  Service: General;  Laterality: N/A;   EXCISIONAL TOTAL HIP ARTHROPLASTY WITH ANTIBIOTIC SPACERS Right 09/25/2020   Procedure: EXCISIONAL TOTAL HIP ARTHROPLASTY WITH ANTIBIOTIC SPACERS;  Surgeon: Paralee Cancel, MD;  Location: [facility];  Service: Orthopedics;  Laterality: Right;   FLEXIBLE SIGMOIDOSCOPY N/A 04/15/2015   Procedure:  UNSEDATED FLEXIBLE SIGMOIDOSCOPY;  Surgeon: [name], MD;  Location: [facility];  Service: Endoscopy;  Laterality: N/A;   KNEE ARTHROSCOPY Left ~ 2016   REIMPLANTATION OF TOTAL HIP Right 12/25/2020   Procedure: REIMPLANTATION/REVISION OF RIGHT TOTAL HIP VERSUS REPEAT IRRIGATION AND DEBRIDEMENT;  Surgeon: Paralee Cancel, MD;  Location: [facility];  Service: Orthopedics;  Laterality: Right;   [place]  TONSILLECTOMY     VARICOSE VEIN SURGERY Bilateral      Social History:  The patient  reports that he has never smoked. He has never used smokeless tobacco. He reports current alcohol use of about 2.0 standard drinks of alcohol per week. He reports that he does not use drugs.   Family History:  The patient's family history includes Atrial fibrillation in his father; Dementia in his maternal grandmother and mother; Hypertension in his father and paternal grandfather; Stroke in his father.    ROS:  Please see the history of present illness. All other systems are reviewed and  Negative to the above problem except as noted.    PHYSICAL EXAM: VS:  BP (!) 144/80   Pulse (!) 48   Ht '5\' 10"'$  (1.778 m)   Wt 261 lb 3.2 oz (118.5 kg)   SpO2 95%   BMI 37.48 kg/m    GEN: Morbidly obese 71 yo in no acute distress  HEENT: normal  Neck:  Neck ful   No definite JVD Cardiac: RRR; no murmurs  1-2 + Le edema   Respiratory:  clear to auscultation bilaterally,   GI: Distended   No hepatomegaly   MS: no deformity Moving all extremities   Skin: warm and dry   Erythema in legs  Neuro:  Deferred  EKG:  EKG is ordered    Sinurs bradycardia 48 bpm  First degree AV block  PR 220 msec     Lipid Panel No results found for: "CHOL", "TRIG", "HDL", "CHOLHDL", "VLDL", "LDLCALC", "LDLDIRECT"    Wt Readings from Last 3 Encounters:  11/17/21 261 lb 3.2 oz (118.5 kg)  08/10/21 260 lb (117.9 kg)  07/13/21 259 lb (117.5 kg)      ASSESSMENT AND PLAN:   1.  LE edema  Pt has had in the past   More now      Will get labs today    Assess what to do with lasix       2  Hx chest tightness   Pt denies     3  Hx PAF  Sinus rhythm   Keep on Flecanide  4  Bradycardia   Will pull back and stop Diltiazem  Cut Toprol down to 25 mg daily  5.  HTN   BP is fair   Probably higher with extra fluid  6.  Lipids.  Excellent     LDL 61, HDL  7.  OSA.   Continue CPAP   F/U this fall in clinic   Will be in touch with him on what to do with diuretic when I see labs   Current medicines are reviewed at length with the patient today.  The patient does not have concerns regarding medicines.  Signed, [name], MD  11/17/2021 3:04 PM    [facility] [address], [place], [place]  [address] Phone: [phone]; Fax: [phone]

## 2021-11-17 ENCOUNTER — Ambulatory Visit: Payer: Medicare Other | Admitting: Internal Medicine

## 2021-11-17 ENCOUNTER — Encounter: Payer: Self-pay | Admitting: Internal Medicine

## 2021-11-17 VITALS — BP 144/80 | HR 48 | Ht 70.0 in | Wt 261.2 lb

## 2021-11-17 DIAGNOSIS — M7989 Other specified soft tissue disorders: Secondary | ICD-10-CM

## 2021-11-17 DIAGNOSIS — Z79899 Other long term (current) drug therapy: Secondary | ICD-10-CM | POA: Diagnosis not present

## 2021-11-17 DIAGNOSIS — I480 Paroxysmal atrial fibrillation: Secondary | ICD-10-CM

## 2021-11-17 DIAGNOSIS — I10 Essential (primary) hypertension: Secondary | ICD-10-CM | POA: Diagnosis not present

## 2021-11-17 DIAGNOSIS — M65322 Trigger finger, left index finger: Secondary | ICD-10-CM | POA: Diagnosis not present

## 2021-11-17 DIAGNOSIS — R002 Palpitations: Secondary | ICD-10-CM

## 2021-11-17 MED ORDER — METOPROLOL SUCCINATE ER 50 MG PO TB24: 25.0000 mg | ORAL_TABLET | Freq: Every day | ORAL | 3 refills | Status: DC

## 2021-11-17 NOTE — Patient Instructions (Signed)
Medication Instructions:  Decrease Toprol to 50 mg take 1/2 tablet daily  Stop Cardizem/ Diltiazem   *If you need a refill on your cardiac medications before your next appointment, please call your pharmacy*   Lab Work: CMET, PRO BNP, CBC, TSH If you have labs (blood work) drawn today and your tests are completely normal, you will receive your results only by: [place] (if you have MyChart) OR A paper copy in the mail If you have any lab test that is abnormal or we need to change your treatment, we will call you to review the results.   Testing/Procedures:    Follow-Up: At [facility], you and your health needs are our priority.  As part of our continuing mission to provide you with exceptional heart care, we have created designated Provider Care Teams.  These Care Teams include your primary Cardiologist (physician) and Advanced Practice Providers (APPs -  Physician Assistants and Nurse Practitioners) who all work together to provide you with the care you need, when you need it.  We recommend signing up for the patient portal called "MyChart".  Sign up information is provided on this After Visit Summary.  MyChart is used to connect with patients for Virtual Visits (Telemedicine).  Patients are able to view lab/test results, encounter notes, upcoming appointments, etc.  Non-urgent messages can be sent to your provider as well.   To learn more about what you can do with MyChart, go to [email or web address].     Important Information About Sugar

## 2021-11-18 DIAGNOSIS — Z79899 Other long term (current) drug therapy: Secondary | ICD-10-CM

## 2021-11-18 DIAGNOSIS — R5383 Other fatigue: Secondary | ICD-10-CM

## 2021-11-18 DIAGNOSIS — R002 Palpitations: Secondary | ICD-10-CM

## 2021-11-18 DIAGNOSIS — M7989 Other specified soft tissue disorders: Secondary | ICD-10-CM

## 2021-11-18 LAB — COMPREHENSIVE METABOLIC PANEL
ALT: 25 IU/L (ref 0–44)
AST: 24 IU/L (ref 0–40)
Albumin/Globulin Ratio: 2.1 (ref 1.2–2.2)
Albumin: 4.4 g/dL (ref 3.9–4.9)
Alkaline Phosphatase: 110 IU/L (ref 44–121)
BUN/Creatinine Ratio: 16 (ref 10–24)
BUN: 13 mg/dL (ref 8–27)
Bilirubin Total: 0.3 mg/dL (ref 0.0–1.2)
CO2: 27 mmol/L (ref 20–29)
Calcium: 9.4 mg/dL (ref 8.6–10.2)
Chloride: 102 mmol/L (ref 96–106)
Creatinine, Ser: 0.8 mg/dL (ref 0.76–1.27)
Globulin, Total: 2.1 g/dL (ref 1.5–4.5)
Glucose: 85 mg/dL (ref 70–99)
Potassium: 3.5 mmol/L (ref 3.5–5.2)
Sodium: 143 mmol/L (ref 134–144)
Total Protein: 6.5 g/dL (ref 6.0–8.5)
eGFR: 95 mL/min/{1.73_m2} (ref >59)

## 2021-11-18 LAB — CBC
Hematocrit: 43.8 % (ref 37.5–51.0)
Hemoglobin: 15.4 g/dL (ref 13.0–17.7)
MCH: 32.7 pg (ref 26.6–33.0)
MCHC: 35.2 g/dL (ref 31.5–35.7)
MCV: 93 fL (ref 79–97)
Platelets: 201 10*3/uL (ref 150–450)
RBC: 4.71 x10E6/uL (ref 4.14–5.80)
RDW: 13 % (ref 11.6–15.4)
WBC: 7.2 10*3/uL (ref 3.4–10.8)

## 2021-11-18 LAB — TSH: TSH: 1.87 u[IU]/mL (ref 0.450–4.500)

## 2021-11-18 LAB — PRO B NATRIURETIC PEPTIDE: NT-Pro BNP: 232 pg/mL (ref 0–376)

## 2021-11-18 MED ORDER — FUROSEMIDE 40 MG PO TABS: ORAL_TABLET | ORAL | 3 refills | Status: DC

## 2021-11-18 NOTE — Telephone Encounter (Signed)
Spoke with the pt and he verbalized understanding of Dr Harrington Challenger' recommendations.

## 2021-11-20 ENCOUNTER — Telehealth: Payer: Self-pay | Admitting: Internal Medicine

## 2021-11-20 NOTE — Telephone Encounter (Signed)
Pt reports afib since MN last night , went back into NSR in the last hour, last about 11 1/2 hours.  HR ranging from 52 - 132.  Most recent HR 52 and pt felt like he was going to pass out when it went from high and shot down to 52. Current BP 147/77 (warns this may not be correct, slightly elevated, and states that it reads high sometime when it is not) Pt to Diltiazem 30 mg at MN & 4am.  Pt took Metoprolol 25 mg at 4am as well and then again w/ morning medications at 9:00 am. Informed that I would send to [name] for her [place]. Advised to call office if stays out of rhythm for more than 12-24 hours. Patient verbalized understanding and agreeable to plan.    If MD agreeable to pt taking PRN Diltiazem & extra PRN Metoprolol then medication list needs to be updated to reflect this.

## 2021-11-20 NOTE — Telephone Encounter (Signed)
Patient c/o Palpitations:  High priority if patient c/o lightheadedness, shortness of breath, or chest pain  How long have you had palpitations/irregular HR/ Afib? He was in A-Fib for about an hour. Are you having the symptoms now? no  Are you currently experiencing lightheadedness, SOB or CP? Lightheadedness is pretty common for him after he gets out of A-fib   Do you have a history of afib (atrial fibrillation) or irregular heart rhythm? Yes, has history of A-Fib   Have you checked your BP or HR? (document readings if available): 130 while in A-Fib, right now it's 52 which is fairly normal for him.   Are you experiencing any other symptoms? No  He wants to make sure cutting back on the metoprolol succinate (TOPROL-XL) 50 MG 24 hr tablet was the right thing to do, and coming off of the diltiazam.

## 2021-11-23 NOTE — Telephone Encounter (Signed)
Left a message for the pt to call back to see how he is doing and how he felt over the weekend.

## 2021-11-23 NOTE — Telephone Encounter (Signed)
Pt has been called by triage... will close this encounter.

## 2021-11-23 NOTE — Telephone Encounter (Signed)
Spoke with the pt and he says that he had a good weekend he did not have any more Afib and his BP was running 140/78 and HR in the upper 50's..he says his edema has much improved on the Lasix 40 mg he says he never had to take the 60 mg (on alternate days) since he had so much improvement.   He will continue to monitor and he will have his Echo planned for 12/02/2021.

## 2021-11-23 NOTE — Telephone Encounter (Signed)
Pt returning nurse's call. Please advise

## 2021-12-01 DIAGNOSIS — M25511 Pain in right shoulder: Secondary | ICD-10-CM | POA: Diagnosis not present

## 2021-12-02 ENCOUNTER — Ambulatory Visit (HOSPITAL_COMMUNITY): Payer: Medicare Other | Attending: Internal Medicine

## 2021-12-02 DIAGNOSIS — R5383 Other fatigue: Secondary | ICD-10-CM | POA: Diagnosis not present

## 2021-12-02 DIAGNOSIS — M7989 Other specified soft tissue disorders: Secondary | ICD-10-CM | POA: Diagnosis not present

## 2021-12-02 DIAGNOSIS — I340 Nonrheumatic mitral (valve) insufficiency: Secondary | ICD-10-CM | POA: Diagnosis not present

## 2021-12-02 DIAGNOSIS — Z79899 Other long term (current) drug therapy: Secondary | ICD-10-CM

## 2021-12-02 LAB — ECHOCARDIOGRAM COMPLETE
Area-P 1/2: 3.27 cm2
S' Lateral: 3.2 cm

## 2021-12-04 ENCOUNTER — Other Ambulatory Visit: Payer: Self-pay | Admitting: Neurology

## 2021-12-04 DIAGNOSIS — M79621 Pain in right upper arm: Secondary | ICD-10-CM | POA: Diagnosis not present

## 2021-12-04 DIAGNOSIS — M25511 Pain in right shoulder: Secondary | ICD-10-CM | POA: Diagnosis not present

## 2021-12-07 ENCOUNTER — Other Ambulatory Visit: Payer: Self-pay | Admitting: Hematology and Oncology

## 2021-12-07 ENCOUNTER — Inpatient Hospital Stay: Payer: Medicare Other | Attending: Hematology and Oncology | Admitting: Hematology and Oncology

## 2021-12-07 ENCOUNTER — Encounter: Payer: Self-pay | Admitting: Internal Medicine

## 2021-12-07 ENCOUNTER — Inpatient Hospital Stay: Payer: Medicare Other

## 2021-12-07 ENCOUNTER — Other Ambulatory Visit: Payer: Self-pay

## 2021-12-07 DIAGNOSIS — Z823 Family history of stroke: Secondary | ICD-10-CM | POA: Insufficient documentation

## 2021-12-07 DIAGNOSIS — Z5112 Encounter for antineoplastic immunotherapy: Secondary | ICD-10-CM | POA: Diagnosis not present

## 2021-12-07 DIAGNOSIS — Z7901 Long term (current) use of anticoagulants: Secondary | ICD-10-CM | POA: Insufficient documentation

## 2021-12-07 DIAGNOSIS — Z818 Family history of other mental and behavioral disorders: Secondary | ICD-10-CM | POA: Insufficient documentation

## 2021-12-07 DIAGNOSIS — M25511 Pain in right shoulder: Secondary | ICD-10-CM | POA: Insufficient documentation

## 2021-12-07 DIAGNOSIS — Z86718 Personal history of other venous thrombosis and embolism: Secondary | ICD-10-CM | POA: Diagnosis not present

## 2021-12-07 DIAGNOSIS — Z79899 Other long term (current) drug therapy: Secondary | ICD-10-CM | POA: Diagnosis not present

## 2021-12-07 DIAGNOSIS — M25512 Pain in left shoulder: Secondary | ICD-10-CM | POA: Diagnosis not present

## 2021-12-07 DIAGNOSIS — R0781 Pleurodynia: Secondary | ICD-10-CM | POA: Insufficient documentation

## 2021-12-07 DIAGNOSIS — Z881 Allergy status to other antibiotic agents status: Secondary | ICD-10-CM | POA: Diagnosis not present

## 2021-12-07 DIAGNOSIS — M79601 Pain in right arm: Secondary | ICD-10-CM | POA: Insufficient documentation

## 2021-12-07 DIAGNOSIS — D472 Monoclonal gammopathy: Secondary | ICD-10-CM

## 2021-12-07 DIAGNOSIS — Z8249 Family history of ischemic heart disease and other diseases of the circulatory system: Secondary | ICD-10-CM | POA: Diagnosis not present

## 2021-12-07 DIAGNOSIS — C9000 Multiple myeloma not having achieved remission: Secondary | ICD-10-CM | POA: Diagnosis not present

## 2021-12-07 LAB — CMP (CANCER CENTER ONLY)
ALT: 26 U/L (ref 0–44)
AST: 20 U/L (ref 15–41)
Albumin: 4.4 g/dL (ref 3.5–5.0)
Alkaline Phosphatase: 103 U/L (ref 38–126)
Anion gap: 9 (ref 5–15)
BUN: 19 mg/dL (ref 8–23)
CO2: 28 mmol/L (ref 22–32)
Calcium: 9.7 mg/dL (ref 8.9–10.3)
Chloride: 103 mmol/L (ref 98–111)
Creatinine: 0.83 mg/dL (ref 0.61–1.24)
GFR, Estimated: >60 mL/min (ref >60)
Glucose, Bld: 109 mg/dL — ABNORMAL HIGH (ref 70–99)
Potassium: 3.4 mmol/L — ABNORMAL LOW (ref 3.5–5.1)
Sodium: 140 mmol/L (ref 135–145)
Total Bilirubin: 0.4 mg/dL (ref 0.3–1.2)
Total Protein: 7.2 g/dL (ref 6.5–8.1)

## 2021-12-07 LAB — CBC WITH DIFFERENTIAL (CANCER CENTER ONLY)
Abs Immature Granulocytes: 0.04 10*3/uL (ref 0.00–0.07)
Basophils Absolute: 0 10*3/uL (ref 0.0–0.1)
Basophils Relative: 0 %
Eosinophils Absolute: 0.1 10*3/uL (ref 0.0–0.5)
Eosinophils Relative: 1 %
HCT: 42.1 % (ref 39.0–52.0)
Hemoglobin: 15 g/dL (ref 13.0–17.0)
Immature Granulocytes: 0 %
Lymphocytes Relative: 14 %
Lymphs Abs: 1.3 10*3/uL (ref 0.7–4.0)
MCH: 32.2 pg (ref 26.0–34.0)
MCHC: 35.6 g/dL (ref 30.0–36.0)
MCV: 90.3 fL (ref 80.0–100.0)
Monocytes Absolute: 1.1 10*3/uL — ABNORMAL HIGH (ref 0.1–1.0)
Monocytes Relative: 11 %
Neutro Abs: 7.2 10*3/uL (ref 1.7–7.7)
Neutrophils Relative %: 74 %
Platelet Count: 284 10*3/uL (ref 150–400)
RBC: 4.66 MIL/uL (ref 4.22–5.81)
RDW: 12.8 % (ref 11.5–15.5)
WBC Count: 9.8 10*3/uL (ref 4.0–10.5)
nRBC: 0 % (ref 0.0–0.2)

## 2021-12-07 MED ORDER — HYDRALAZINE HCL 10 MG PO TABS: 10.0000 mg | ORAL_TABLET | Freq: Two times a day (BID) | ORAL | 3 refills | Status: DC

## 2021-12-07 NOTE — Assessment & Plan Note (Signed)
03/21/2019:Kappa268 (was 112:9 months ago), lambda 10.3, ratio 26.07 (it was 15.18), M protein 0.4 g (was 0.6 g)Hemoglobin 14.9, calcium 9.2, creatinine 0.87 03/25/2020: Kappa 623, Lambda 8.9, Ratio: 72, Hb 14, Cr 0.88, Calcium: 9.5, LDH 265, M Protein 0.2 gm.  05/05/2020: Hemoglobin 14, Kappa 598, lambda 8.2, ratio 72.9, creatinine 0.76, IgG 587, IgA 441, M protein 0.3 g  04/15/2021: Hemoglobin 14.5, Kappa 580, lambda 7, ratio 82.9, creatinine 0.88, calcium 9, IgG 534, IgA: 386 M protein 0.3 g IgA kappa  Bone marrow biopsy 05/05/2020: Hypercellular bone marrow (50%) for age with plasma cells 14% display kappa light chain restriction, FISH pending, cytogenetics: Normal 46 XY  Patient saw Dr. Varkey who performed MRI and found that he has a near impending fracture of the humerus. There were additional lesions suspicious for multiple myeloma.  Plan: 1.  Recheck labs with CBC, CMP, SPEP, KL ratio, beta-2 microglobulin 2. I will discuss with Dr. Varkey to perform surgery to stabilize the humerus and at the same time obtaining tissue for bone marrow evaluation. 3.  Bone survey to check all of his bones.  If he has transformed into multiple myeloma the he will need treatment. I discussed with him about transferring his care for treatment of multiple myeloma to Dr. Dorsey. 

## 2021-12-07 NOTE — Telephone Encounter (Signed)
Would recomm 10 mg hydralazine as needed for high BP    Continue to follow closely

## 2021-12-07 NOTE — Progress Notes (Signed)
Patient Care Team: Lajean Manes, MD as PCP - General (Internal Medicine) Fay Records, MD as PCP - Cardiology (Cardiology) Sueanne Margarita, MD as PCP - Sleep Medicine (Cardiology) Thompson Grayer, MD as PCP - Electrophysiology (Cardiology) Michael Boston, MD as Consulting Physician (General Surgery) Clarene Essex, MD as Consulting Physician (Gastroenterology) Sueanne Margarita, MD as Consulting Physician (Sleep Medicine) Alda Berthold, DO as Consulting Physician (Neurology)  DIAGNOSIS:  Encounter Diagnosis  Name Primary?   Smoldering multiple myeloma     SUMMARY OF ONCOLOGIC HISTORY: Oncology History  Smoldering multiple myeloma  07/21/2017 Initial Diagnosis   Smoldering multiple myeloma ([place])   09/13/2017 Tumor Marker   tProt 6.8, Alb 3.8, Ca 9.3, Cr 0.9, AP 98 LDH 184, beta-2 microglobulin 1.4 SPEP -- M-spike 0.5g/dL, SIFE -- IgA kappa IgG 519, IgA 544, IgM 29; kappa 119.3, lambda 8.5, KLR 14.04 WBC 7.6, Hgb 14.6, Plt 208     CHIEF COMPLIANT: Follow-up myeloma and impending humerus fracture   INTERVAL HISTORY: Travis [name] is a 71 y.o with the above-mentioned history of smoldering myeloma who was under surveillance.  He presented with sudden onset of right arm and shoulder pain.  He saw Dr. Griffin Basil who performed initially x-rays that suggested lesion in the humerus.  He underwent an MRI that confirmed some of the suspicion that this may be truly multiple myeloma.  Although we do not have the official report it is fairly concerning for a malignant process in the bone.  Dr. Darcus Austin called me today and asked me to see him.  We saw him urgently in our office.  He has his right arm in a sling and is in significant amount of pain even if he moves the arm ever so slightly.  He states that he had pain in left ribs and pain in right shoulder. He does has pain in the left shoulder but doesn't know if it is coming from him pushing down while doing some yard work.   ALLERGIES:  is  allergic to linezolid and vancomycin.  MEDICATIONS:  Current Outpatient Medications  Medication Sig Dispense Refill   molnupiravir EUA (LAGEVRIO) 200 MG CAPS capsule 4 capsules     amLODipine (NORVASC) 10 MG tablet Take 10 mg by mouth daily.      apixaban (ELIQUIS) 5 MG TABS tablet TAKE ONE TABLET BY MOUTH TWICE A DAY 60 tablet 6   Ascorbic Acid (VITAMIN C) 100 MG tablet Take 100 mg by mouth daily.     b complex vitamins tablet Take 1 tablet by mouth daily.     cholecalciferol (VITAMIN D3) 25 MCG (1000 UNIT) tablet Take 1,000 Units by mouth daily.     clobetasol cream (TEMOVATE) 0.05 % Apply topically.     docusate sodium (COLACE) 100 MG capsule Take 1 capsule (100 mg total) by mouth 2 (two) times daily. 10 capsule 0   flecainide (TAMBOCOR) 50 MG tablet TAKE ONE AND ONE-HALF TABLETS BY MOUTH TWICE A DAY 270 tablet 3   fluticasone (FLONASE) 50 MCG/ACT nasal spray Place 1 spray into both nostrils daily as needed for allergies or rhinitis.     folic acid (FOLVITE) 1 MG tablet Take 1 mg by mouth daily.     furosemide (LASIX) 40 MG tablet Take 1 tablet (40 mg) by mouth every other day alternating with 1 and 1/2 tablets (60 mg) on the opposite days. (Patient taking differently: Take 40 mg by mouth daily. Take 1 tablet (40 mg) by mouth every other day  alternating with 1 and 1/2 tablets (60 mg) on the opposite days.) 113 tablet 3   gabapentin (NEURONTIN) 300 MG capsule TAKE TWO CAPSULES BY MOUTH TWICE A DAY 360 capsule 0   hydrochlorothiazide (HYDRODIURIL) 25 MG tablet TAKE ONE TABLET BY MOUTH ONE TIME DAILY 90 tablet 3   lisinopril (ZESTRIL) 40 MG tablet TAKE ONE TABLET BY MOUTH ONE TIME DAILY 90 tablet 3   methocarbamol (ROBAXIN) 750 MG tablet Take 750 mg by mouth every 8 (eight) hours as needed.     metoprolol succinate (TOPROL-XL) 50 MG 24 hr tablet Take 0.5 tablets (25 mg total) by mouth daily. 90 tablet 3   molnupiravir EUA (LAGEVRIO) 200 MG CAPS capsule TAKE 4 CAPSULES BY MOUTH EVERY 12 HOURS  FOR 5 DAYS     oxymetazoline (AFRIN) 0.05 % nasal spray Place 1 spray into both nostrils 2 (two) times daily as needed for congestion.     potassium chloride SA (KLOR-CON M) 20 MEQ tablet TAKE ONE TABLET BY MOUTH ONE TIME DAILY, TAKE 40 MG (2 TABLETS) ON DAYS THAT YOU ALSO TAKE LASIX (FUROSEMIDE) 135 tablet 3   psyllium (METAMUCIL) 58.6 % packet Take 1 packet by mouth daily.     sodium chloride (OCEAN) 0.65 % SOLN nasal spray Place 1 spray into both nostrils as needed for congestion.     traMADol (ULTRAM) 50 MG tablet Take 50 mg by mouth every 6 (six) hours as needed.     triamcinolone cream (KENALOG) 0.1 % Apply 1 application topically daily as needed (itching).     No current facility-administered medications for this visit.    PHYSICAL EXAMINATION: ECOG PERFORMANCE STATUS: 2 - Symptomatic, <50% confined to bed  Vitals:   12/07/21 1543  BP: (!) 185/83  Pulse: 61  Resp: 18  Temp: (!) 97.3 F (36.3 C)  SpO2: 96%   Filed Weights   12/07/21 1543  Weight: 256 lb 9.6 oz (116.4 kg)      LABORATORY DATA:  I have reviewed the data as listed    Latest Ref Rng & Units 12/07/2021    3:39 PM 11/17/2021    3:13 PM 04/10/2021   10:12 AM  CMP  Glucose 70 - 99 mg/dL 109  85  114   BUN 8 - 23 mg/dL '19  13  17   ' Creatinine 0.61 - 1.24 mg/dL 0.83  0.80  0.88   Sodium 135 - 145 mmol/L 140  143  141   Potassium 3.5 - 5.1 mmol/L 3.4  3.5  3.5   Chloride 98 - 111 mmol/L 103  102  106   CO2 22 - 32 mmol/L '28  27  25   ' Calcium 8.9 - 10.3 mg/dL 9.7  9.4  9.0   Total Protein 6.5 - 8.1 g/dL 7.2  6.5  6.8   Total Bilirubin 0.3 - 1.2 mg/dL 0.4  0.3  0.5   Alkaline Phos 38 - 126 U/L 103  110  112   AST 15 - 41 U/L '20  24  21   ' ALT 0 - 44 U/L '26  25  23     ' Lab Results  Component Value Date   WBC 9.8 12/07/2021   HGB 15.0 12/07/2021   HCT 42.1 12/07/2021   MCV 90.3 12/07/2021   PLT 284 12/07/2021   NEUTROABS 7.2 12/07/2021    ASSESSMENT & PLAN:  Smoldering multiple myeloma  ([place]) 03/21/2019:Kappa 268 (was 112: 9 months ago), lambda 10.3, ratio 26.07 (it was 15.18), M protein  0.4 g (was 0.6 g)Hemoglobin 14.9, calcium 9.2, creatinine 0.87 03/25/2020: Kappa 623, Lambda 8.9, Ratio: 72, Hb 14, Cr 0.88,  Calcium: 9.5, LDH 265, M Protein 0.2 gm.  05/05/2020: Hemoglobin 14, Kappa 598, lambda 8.2, ratio 72.9, creatinine 0.76, IgG 587, IgA 441, M protein 0.3 g  04/15/2021: Hemoglobin 14.5, Kappa 580, lambda 7, ratio 82.9, creatinine 0.88, calcium 9, IgG 534, IgA: 386 M protein 0.3 g IgA kappa   Bone marrow biopsy 05/05/2020: Hypercellular bone marrow (50%) for age with plasma cells 14% display kappa light chain restriction, FISH pending, cytogenetics: Normal 10 XY   Patient saw Dr. Griffin Basil who performed MRI and found that he has a near impending fracture of the humerus. There were additional lesions suspicious for multiple myeloma.  Plan: 1.  Recheck labs with CBC, CMP, SPEP, KL ratio, beta-2 microglobulin 2. I will discuss with Dr. Griffin Basil to perform surgery to stabilize the humerus and at the same time obtaining tissue for bone marrow evaluation. 3.  Bone survey to check all of his bones.  If he has transformed into multiple myeloma the he will need treatment. I discussed with him about transferring his care for treatment of multiple myeloma to Dr. Lorenso Courier. I requested Dr. Ledon Snare for palliative radiation to the arm after his surgical procedure for stabilization. I requested Dr. Lorenso Courier to see him for follow-up after surgery to discuss systemic treatment options.   No orders of the defined types were placed in this encounter.  The patient has a good understanding of the overall plan. he agrees with it. he will call with any problems that may develop before the next visit here. Total time spent: 30 mins including face to face time and time spent for planning, charting and co-ordination of care   Harriette Ohara, MD 12/07/21    I Gardiner Coins am scribing for Dr.  Lindi Adie    I have reviewed the above documentation for accuracy and completeness, and I agree with the above.

## 2021-12-08 LAB — KAPPA/LAMBDA LIGHT CHAINS
Kappa free light chain: 1971.3 mg/L — ABNORMAL HIGH (ref 3.3–19.4)
Kappa, lambda light chain ratio: 358.42 — ABNORMAL HIGH (ref 0.26–1.65)
Lambda free light chains: 5.5 mg/L — ABNORMAL LOW (ref 5.7–26.3)

## 2021-12-08 LAB — BETA 2 MICROGLOBULIN, SERUM: Beta-2 Microglobulin: 1.7 mg/L (ref 0.6–2.4)

## 2021-12-09 ENCOUNTER — Telehealth: Payer: Self-pay | Admitting: Hematology and Oncology

## 2021-12-09 ENCOUNTER — Encounter: Payer: Self-pay | Admitting: *Deleted

## 2021-12-09 NOTE — Telephone Encounter (Signed)
Per 8/9 secure chat called and spoke to pt about appointment   pt confirmed appointment

## 2021-12-09 NOTE — Progress Notes (Signed)
Received pre op risk assessment form from [employer].  Form signed by MD and successfully faxed to [phone].

## 2021-12-09 NOTE — Progress Notes (Signed)
Histology and Location of Primary Cancer: Multiple Myeloma  Sites of Visceral and Bony Metastatic Disease: Right Humerus/Shoulder  Location(s) of Symptomatic Metastases: Right Humerus/Shoulder  Past/Anticipated chemotherapy by medical oncology, if any:   12/07/2021 Dr. Lindi Adie  Plan: 1.  Recheck labs with CBC, CMP, SPEP, KL ratio, beta-2 microglobulin  2. I will discuss with Dr. Griffin Basil to perform surgery to stabilize the humerus and at the same time obtaining tissue for bone marrow evaluation. 3.  Bone survey to check all of his bones.   If he has transformed into multiple myeloma the he will need treatment.  I discussed with him about transferring his care for treatment of multiple myeloma to Dr. Lorenso Courier.  I requested Dr. Ledon Snare for palliative radiation to the arm after his surgical procedure for stabilization.  I requested Dr. Lorenso Courier to see him for follow-up after surgery to discuss systemic treatment options.   Pain on a scale of 0-10 is:  1/10 when not moving.  Right shoulder, humerus, elbow.   If Spine Met(s), symptoms, if any, include: Bowel/Bladder retention or incontinence (please describe): No Numbness or weakness in extremities (please describe): Right arm weakness is in sling. Current Decadron regimen, if applicable:  No  Ambulatory status? Walker? Wheelchair?: Ambulatory  SAFETY ISSUES: Prior radiation? No Pacemaker/ICD? No Possible current pregnancy? Male Is the patient on methotrexate? No  Current Complaints / other details:  Need more information on treatment options.

## 2021-12-10 ENCOUNTER — Inpatient Hospital Stay: Payer: Medicare Other

## 2021-12-10 ENCOUNTER — Ambulatory Visit
Admission: RE | Admit: 2021-12-10 | Discharge: 2021-12-10 | Disposition: A | Discharge status: Home / Self Care | Payer: Medicare Other | Source: Ambulatory Visit | Attending: Radiation Oncology | Admitting: Radiation Oncology

## 2021-12-10 ENCOUNTER — Telehealth: Payer: Self-pay | Admitting: *Deleted

## 2021-12-10 ENCOUNTER — Inpatient Hospital Stay: Payer: Medicare Other | Admitting: Hematology and Oncology

## 2021-12-10 ENCOUNTER — Other Ambulatory Visit: Payer: Self-pay

## 2021-12-10 VITALS — BP 168/82 | HR 63 | Temp 98.1°F | Resp 16 | Wt 254.3 lb

## 2021-12-10 DIAGNOSIS — Z79899 Other long term (current) drug therapy: Secondary | ICD-10-CM | POA: Insufficient documentation

## 2021-12-10 DIAGNOSIS — G4730 Sleep apnea, unspecified: Secondary | ICD-10-CM | POA: Insufficient documentation

## 2021-12-10 DIAGNOSIS — K219 Gastro-esophageal reflux disease without esophagitis: Secondary | ICD-10-CM | POA: Insufficient documentation

## 2021-12-10 DIAGNOSIS — Z881 Allergy status to other antibiotic agents status: Secondary | ICD-10-CM | POA: Diagnosis not present

## 2021-12-10 DIAGNOSIS — M25511 Pain in right shoulder: Secondary | ICD-10-CM | POA: Diagnosis not present

## 2021-12-10 DIAGNOSIS — M79601 Pain in right arm: Secondary | ICD-10-CM | POA: Diagnosis not present

## 2021-12-10 DIAGNOSIS — C9000 Multiple myeloma not having achieved remission: Secondary | ICD-10-CM

## 2021-12-10 DIAGNOSIS — I480 Paroxysmal atrial fibrillation: Secondary | ICD-10-CM | POA: Insufficient documentation

## 2021-12-10 DIAGNOSIS — I10 Essential (primary) hypertension: Secondary | ICD-10-CM | POA: Insufficient documentation

## 2021-12-10 DIAGNOSIS — Z823 Family history of stroke: Secondary | ICD-10-CM | POA: Diagnosis not present

## 2021-12-10 DIAGNOSIS — M25519 Pain in unspecified shoulder: Secondary | ICD-10-CM | POA: Insufficient documentation

## 2021-12-10 DIAGNOSIS — D472 Monoclonal gammopathy: Secondary | ICD-10-CM

## 2021-12-10 DIAGNOSIS — Z86718 Personal history of other venous thrombosis and embolism: Secondary | ICD-10-CM | POA: Insufficient documentation

## 2021-12-10 DIAGNOSIS — R0781 Pleurodynia: Secondary | ICD-10-CM | POA: Diagnosis not present

## 2021-12-10 DIAGNOSIS — M129 Arthropathy, unspecified: Secondary | ICD-10-CM | POA: Insufficient documentation

## 2021-12-10 DIAGNOSIS — M25512 Pain in left shoulder: Secondary | ICD-10-CM | POA: Diagnosis not present

## 2021-12-10 DIAGNOSIS — Z5112 Encounter for antineoplastic immunotherapy: Secondary | ICD-10-CM | POA: Diagnosis not present

## 2021-12-10 DIAGNOSIS — Z8249 Family history of ischemic heart disease and other diseases of the circulatory system: Secondary | ICD-10-CM | POA: Diagnosis not present

## 2021-12-10 DIAGNOSIS — Z7901 Long term (current) use of anticoagulants: Secondary | ICD-10-CM | POA: Insufficient documentation

## 2021-12-10 MED ORDER — DEXAMETHASONE 4 MG PO TABS: 40.0000 mg | ORAL_TABLET | ORAL | 5 refills | Status: DC

## 2021-12-10 MED ORDER — ACYCLOVIR 400 MG PO TABS: 400.0000 mg | ORAL_TABLET | Freq: Two times a day (BID) | ORAL | 3 refills | Status: DC

## 2021-12-10 MED ORDER — ONDANSETRON HCL 8 MG PO TABS: 8.0000 mg | ORAL_TABLET | Freq: Three times a day (TID) | ORAL | 0 refills | Status: DC | PRN

## 2021-12-10 MED ORDER — PROCHLORPERAZINE MALEATE 10 MG PO TABS: 10.0000 mg | ORAL_TABLET | Freq: Four times a day (QID) | ORAL | 0 refills | Status: DC | PRN

## 2021-12-10 NOTE — Telephone Encounter (Addendum)
Will route to pharm for input on anticoagulation then patient will need expedited regular call given last visit 11/17/21, may need to discuss with DOD if any new/persistent sx. Will mark in preop box notes that this is time sensitive.

## 2021-12-10 NOTE — Telephone Encounter (Signed)
   Pre-operative Risk Assessment    Patient Name: Travis [name]  DOB: 06/18/1950 MRN: [id number]      Request for Surgical Clearance    Procedure:   RIGHT HUMERUS FRACTURE, BONE BIOPSY  Date of Surgery:  Clearance 12/17/21                                 Surgeon:  Ophelia Charter, MD Surgeon's Group or Practice Name:  [facility] Phone number:  [phone] Fax number:  [phone]   Type of Clearance Requested:   - Pharmacy:  Hold Apixaban (Eliquis) NOT INDICATED   Type of Anesthesia:  General    Additional requests/questions:    Astrid Divine   12/10/2021, 2:14 PM

## 2021-12-10 NOTE — Progress Notes (Signed)
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    ICD-10-CM   1. Multiple myeloma not having achieved remission (HCC)  C90.00       HISTORY OF PRESENT ILLNESS: Travis [name] is a 71 y.o. male seen at the request of Dr. Lindi Adie.  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 Lindi Adie and Dr. Baltazar Najjar at [facility].  There was no evidence of active lesions at diagnosis and his annual lab work over the years had not suggested any transformation into multiple myeloma.  However, more recently, he developed severe right arm/shoulder pain and was evaluated by his orthopedist, Dr. Griffin Basil.  X-rays of the right shoulder and humerus obtained on 12/01/2021 showed a suspicious lesion in the right proximal humerus.  This was further evaluated with an MRI of the right shoulder and humerus on 12/04/2021 which showed a 2.8 cm lytic lesion in the distal acromion at the [facility] joint as well as multiple lesions in the right humerus including a 3.6 cm destructive lesion involving the humeral head, surgical neck and proximal diaphysis with high-grade erosion of the cortex and a 9.5 cm destructive lesion in the proximal humerus with high-grade erosion of the cortex, making the patient high risk for fracture.  He had recent repeat lab work that shows significant elevation in the kappa light chains as well as increased kappa/lambda light chain ratio.  He is scheduled for a bone survey on 12/15/2021 and will have ORIF with an IM nail in the right humerus under the care of Dr. Griffin Basil on 12/17/2021.  He has been  kindly referred to [place] today to discuss the role of postoperative radiotherapy in the management of his disease.  PREVIOUS RADIATION THERAPY: No  PAST MEDICAL HISTORY:  Past Medical History:  Diagnosis Date   Arthritis    "comes and goes; mostly in hands, occasionally elbow" (59/08/6385)   Complication of anesthesia    "just wanted to sleep alot  for couple days S/P VARICOCELE OR" (04/05/2017)   DVT (deep venous thrombosis) (HCC)    LLE   Dysrhythmia    PVCs    GERD (gastroesophageal reflux disease)    History of hiatal hernia    Hypertension    Impaired glucose tolerance    Myeloma ([place])    Obesity (BMI 30-39.9) 07/26/2016   OSA on CPAP 07/26/2016   Mild with AHI 12.5/hr now on CPAP at 14cm H2O   PAF (paroxysmal atrial fibrillation) ([place])    s/p PVI Ablation in 2018 // Flecainide Rx // Echo 8/21: EF 60-65, no RWMA, mild LVH, normal RV SF, moderate RVE, mild BAE, trivial MR, mild dilation of aortic root (40 mm)   Pneumonia    "may have had walking pneumonia a few years ago" (04/05/2017)   Pre-diabetes    Thrombophlebitis       PAST SURGICAL HISTORY: Past Surgical History:  Procedure Laterality Date   ATRIAL FIBRILLATION ABLATION  04/05/2017   ATRIAL FIBRILLATION ABLATION N/A 04/05/2017   Procedure: ATRIAL FIBRILLATION ABLATION;  Surgeon: Thompson Grayer, MD;  Location: [place] [facility];  Service: Cardiovascular;  Laterality: N/A;  COMPLETE RIGHT HIP REPLACMENT  01/19/2019   EVALUATION UNDER ANESTHESIA WITH HEMORRHOIDECTOMY N/A 02/24/2017   Procedure: EXAM UNDER ANESTHESIA WITH  HEMORRHOIDECTOMY;  Surgeon: Michael Boston, MD;  Location: [facility];  Service: General;  Laterality: N/A;   EXCISIONAL TOTAL HIP ARTHROPLASTY WITH ANTIBIOTIC SPACERS Right 09/25/2020   Procedure: EXCISIONAL TOTAL HIP ARTHROPLASTY WITH ANTIBIOTIC SPACERS;  Surgeon: Paralee Cancel, MD;  Location: [facility];  Service: Orthopedics;  Laterality: Right;   FLEXIBLE SIGMOIDOSCOPY N/A 04/15/2015   Procedure:  UNSEDATED  FLEXIBLE SIGMOIDOSCOPY;  Surgeon: [name], MD;  Location: [facility];  Service: Endoscopy;  Laterality: N/A;   KNEE ARTHROSCOPY Left ~ 2016   REIMPLANTATION OF TOTAL HIP Right 12/25/2020   Procedure: REIMPLANTATION/REVISION OF RIGHT TOTAL HIP VERSUS REPEAT IRRIGATION AND DEBRIDEMENT;  Surgeon: Paralee Cancel, MD;  Location: [facility];  Service: Orthopedics;  Laterality: Right;   TESTICLE SURGERY  1988   VARICOCELE   TONSILLECTOMY     VARICOSE VEIN SURGERY Bilateral     FAMILY HISTORY:  Family History  Problem Relation Age of Onset   Dementia Mother    Stroke Father    Atrial fibrillation Father    Hypertension Father    Hypertension Paternal Grandfather    Dementia Maternal Grandmother     SOCIAL HISTORY:  Social History   Socioeconomic History   Marital status: Married    Spouse name: Not on file   Number of children: 1   Years of education: law school   Highest education level: Not on file  Occupational History   Occupation: lawyer  Tobacco Use   Smoking status: Never   Smokeless tobacco: Never  Vaping Use   Vaping Use: Never used  Substance and Sexual Activity   Alcohol use: Yes    Alcohol/week: 2.0 standard drinks of alcohol    Types: 2 Glasses of wine per week    Comment: 2 glasses of wine a week   Drug use: No   Sexual activity: Not Currently  Other Topics Concern   Not on file  Social History Narrative   Lives in [place] with spouse in a 2 story home.  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    Social Determinants of Health   Financial Resource Strain: Not on file  Food Insecurity: Not on file  Transportation Needs: Not on file  Physical Activity: Not on file  Stress: Not on file  Social Connections: Not on file  Intimate Partner Violence: Not on file    ALLERGIES: Linezolid and Vancomycin  MEDICATIONS:  Current Outpatient Medications  Medication Sig Dispense Refill    amLODipine (NORVASC) 10 MG tablet Take 10 mg by mouth daily.      apixaban (ELIQUIS) 5 MG TABS tablet TAKE ONE TABLET BY MOUTH TWICE A DAY 60 tablet 6   Ascorbic Acid (VITAMIN C) 100 MG tablet Take 100 mg by mouth daily.     b complex vitamins tablet Take 1 tablet by mouth daily.     cholecalciferol (VITAMIN D3) 25 MCG (1000 UNIT) tablet Take 1,000 Units by mouth daily.     clobetasol cream (TEMOVATE) 0.05 % Apply topically.     docusate sodium (COLACE) 100 MG capsule Take 1 capsule (100 mg total) by mouth 2 (two) times daily. 10 capsule 0   flecainide (TAMBOCOR) 50 MG tablet TAKE ONE AND ONE-HALF TABLETS BY MOUTH TWICE A DAY 270 tablet 3   fluticasone (FLONASE)  50 MCG/ACT nasal spray Place 1 spray into both nostrils daily as needed for allergies or rhinitis.     folic acid (FOLVITE) 1 MG tablet Take 1 mg by mouth daily.     furosemide (LASIX) 40 MG tablet Take 1 tablet (40 mg) by mouth every other day alternating with 1 and 1/2 tablets (60 mg) on the opposite days. (Patient taking differently: Take 40 mg by mouth daily. Take 1 tablet (40 mg) by mouth every other day alternating with 1 and 1/2 tablets (60 mg) on the opposite days.) 113 tablet 3   gabapentin (NEURONTIN) 300 MG capsule TAKE TWO CAPSULES BY MOUTH TWICE A DAY 360 capsule 0   hydrALAZINE (APRESOLINE) 10 MG tablet Take 1 tablet (10 mg total) by mouth 2 (two) times daily. AS NEEDED FOR HIGH BP 180 tablet 3   hydrochlorothiazide (HYDRODIURIL) 25 MG tablet TAKE ONE TABLET BY MOUTH ONE TIME DAILY 90 tablet 3   lisinopril (ZESTRIL) 40 MG tablet TAKE ONE TABLET BY MOUTH ONE TIME DAILY 90 tablet 3   methocarbamol (ROBAXIN) 750 MG tablet Take 750 mg by mouth every 8 (eight) hours as needed.     metoprolol succinate (TOPROL-XL) 50 MG 24 hr tablet Take 0.5 tablets (25 mg total) by mouth daily. 90 tablet 3   molnupiravir EUA (LAGEVRIO) 200 MG CAPS capsule 4 capsules     molnupiravir EUA (LAGEVRIO) 200 MG CAPS capsule TAKE 4 CAPSULES BY MOUTH EVERY  12 HOURS FOR 5 DAYS     oxymetazoline (AFRIN) 0.05 % nasal spray Place 1 spray into both nostrils 2 (two) times daily as needed for congestion.     potassium chloride SA (KLOR-CON M) 20 MEQ tablet TAKE ONE TABLET BY MOUTH ONE TIME DAILY, TAKE 40 MG (2 TABLETS) ON DAYS THAT YOU ALSO TAKE LASIX (FUROSEMIDE) 135 tablet 3   psyllium (METAMUCIL) 58.6 % packet Take 1 packet by mouth daily.     sodium chloride (OCEAN) 0.65 % SOLN nasal spray Place 1 spray into both nostrils as needed for congestion.     traMADol (ULTRAM) 50 MG tablet Take 50 mg by mouth every 6 (six) hours as needed.     triamcinolone cream (KENALOG) 0.1 % Apply 1 application topically daily as needed (itching).     No current facility-administered medications for this visit.    REVIEW OF SYSTEMS:  On review of systems, the patient reports that he is doing well overall aside from the severe right shoulder/arm pain that is limiting his ADLs. He denies any chest pain, shortness of breath, cough, fevers, chills, night sweats or unintended weight changes. He denies any bowel or bladder disturbances, and denies abdominal pain, nausea or vomiting. He also reports pain in the ribs bilaterally but not severe. A complete review of systems is obtained and is otherwise negative.    PHYSICAL EXAM:  Wt Readings from Last 3 Encounters:  12/07/21 256 lb 9.6 oz (116.4 kg)  11/17/21 261 lb 3.2 oz (118.5 kg)  08/10/21 260 lb (117.9 kg)   Temp Readings from Last 3 Encounters:  12/07/21 (!) 97.3 F (36.3 C) (Temporal)  04/16/21 97.9 F (36.6 C) (Temporal)  04/15/21 98.2 F (36.8 C) (Oral)   BP Readings from Last 3 Encounters:  12/07/21 (!) 185/83  11/17/21 (!) 144/80  08/10/21 (!) 149/74   Pulse Readings from Last 3 Encounters:  12/07/21 61  11/17/21 (!) 48  08/10/21 (!) 57    /10  In general this is a well appearing Caucasian male  in no acute distress.  He's alert and oriented x4 and appropriate throughout the examination.  Cardiopulmonary assessment is negative for acute distress and he exhibits normal effort. He is unable to move the right upper extremity due to severe pain associated with any ROM/activity. He is wearing a sling which does help with the pain.  KPS = 100  100 - Normal; no complaints; no evidence of disease. 90   - Able to carry on normal activity; minor signs or symptoms of disease. 80   - Normal activity with effort; some signs or symptoms of disease. 74   - Cares for self; unable to carry on normal activity or to do active work. 60   - Requires occasional assistance, but is able to care for most of his personal needs. 50   - Requires considerable assistance and frequent medical care. 86   - Disabled; requires special care and assistance. 77   - Severely disabled; hospital admission is indicated although death not imminent. 9   - Very sick; hospital admission necessary; active supportive treatment necessary. 10   - Moribund; fatal processes progressing rapidly. 0     - Dead  Karnofsky DA, Abelmann Craig, Craver LS and Burchenal [facility] [phone]) The use of the nitrogen mustards in the palliative treatment of carcinoma: with particular reference to bronchogenic carcinoma Cancer 1 634-56  LABORATORY DATA:  Lab Results  Component Value Date   WBC 9.8 12/07/2021   HGB 15.0 12/07/2021   HCT 42.1 12/07/2021   MCV 90.3 12/07/2021   PLT 284 12/07/2021   Lab Results  Component Value Date   NA 140 12/07/2021   K 3.4 (L) 12/07/2021   CL 103 12/07/2021   CO2 28 12/07/2021   Lab Results  Component Value Date   ALT 26 12/07/2021   AST 20 12/07/2021   ALKPHOS 103 12/07/2021   BILITOT 0.4 12/07/2021     RADIOGRAPHY: ECHOCARDIOGRAM COMPLETE  Result Date: 12/02/2021    ECHOCARDIOGRAM REPORT   Patient Name:   Travis [name] Date of Exam: 12/02/2021 Medical Rec #:  [id number]        Height:       70.0 in Accession #:    [id number]       Weight:       261.2 lb Date of Birth:  09/05/1950       BSA:           2.338 m Patient Age:    40 years         BP:           144/80 mmHg Patient Gender: M                HR:           55 bpm. Exam Location:  [place] Procedure: 2D Echo, Strain Analysis, Color Doppler, Cardiac Doppler and 3D Echo Indications:    Dyspnea R06.00  History:        Patient has prior history of Echocardiogram examinations, most                 recent 12/04/2019. Arrythmias:Atrial Fibrillation,                 Signs/Symptoms:Edema and Fatigue; Risk Factors:Hypertension.  Sonographer:    Mikki Santee RDCS Referring Phys: [place]  1. Global longitudinal strain is -20.7%. Left ventricular ejection fraction, by estimation, is 60 to 65%. The left ventricle has normal function. The left ventricle has  no regional wall motion abnormalities. Left ventricular diastolic parameters were normal.  2. Right ventricular systolic function is normal. The right ventricular size is normal.  3. The mitral valve is normal in structure. Mild mitral valve regurgitation.  4. The aortic valve is normal in structure. Aortic valve regurgitation is not visualized.  5. The inferior vena cava is normal in size with greater than 50% respiratory variability, suggesting right atrial pressure of 3 mmHg. FINDINGS  Left Ventricle: Global longitudinal strain is -20.7%. Left ventricular ejection fraction, by estimation, is 60 to 65%. The left ventricle has normal function. The left ventricle has no regional wall motion abnormalities. The left ventricular internal cavity size was normal in size. There is no left ventricular hypertrophy. Left ventricular diastolic parameters were normal. Right Ventricle: The right ventricular size is normal. Right vetricular wall thickness was not assessed. Right ventricular systolic function is normal. Left Atrium: Left atrial size was normal in size. Right Atrium: Right atrial size was normal in size. Pericardium: There is no evidence of pericardial effusion. Mitral Valve: The mitral  valve is normal in structure. Mild mitral valve regurgitation. Tricuspid Valve: The tricuspid valve is normal in structure. Tricuspid valve regurgitation is trivial. Aortic Valve: The aortic valve is normal in structure. Aortic valve regurgitation is not visualized. Pulmonic Valve: The pulmonic valve was normal in structure. Pulmonic valve regurgitation is trivial. Aorta: The aortic root and ascending aorta are structurally normal, with no evidence of dilitation. Venous: The inferior vena cava is normal in size with greater than 50% respiratory variability, suggesting right atrial pressure of 3 mmHg. IAS/Shunts: No atrial level shunt detected by color flow Doppler.  LEFT VENTRICLE PLAX 2D LVIDd:         5.30 cm   Diastology LVIDs:         3.20 cm   LV e' medial:    7.36 cm/s LV PW:         1.10 cm   LV E/e' medial:  9.8 LV IVS:        1.00 cm   LV e' lateral:   11.50 cm/s LVOT diam:     2.50 cm   LV E/e' lateral: 6.3 LV SV:         107 LV SV Index:   46 LVOT Area:     4.91 cm                           3D Volume EF:                          3D EF:        57 %                          LV EDV:       172 ml                          LV ESV:       74 ml                          LV SV:        99 ml RIGHT VENTRICLE RV Basal diam:  4.40 cm RV Mid diam:    3.10 cm RV S prime:     16.00 cm/s TAPSE (M-mode): 2.8  cm LEFT ATRIUM             Index        RIGHT ATRIUM           Index LA diam:        4.30 cm 1.84 cm/m   RA Area:     21.70 cm LA Vol (A2C):   66.8 ml 28.57 ml/m  RA Volume:   60.80 ml  26.00 ml/m LA Vol (A4C):   69.4 ml 29.68 ml/m LA Biplane Vol: 70.9 ml 30.32 ml/m  AORTIC VALVE LVOT Vmax:   96.20 cm/s LVOT Vmean:  61.800 cm/s LVOT VTI:    0.217 m  AORTA Ao Root diam: 3.80 cm Ao Asc diam:  3.80 cm MITRAL VALVE MV Area (PHT): 3.27 cm    SHUNTS MV Decel Time: 232 msec    Systemic VTI:  0.22 m MV E velocity: 72.00 cm/s  Systemic Diam: 2.50 cm MV A velocity: 32.50 cm/s MV E/A ratio:  2.22 [name] MD  Electronically signed by [name] MD Signature Date/Time: 12/02/2021/4:04:21 PM    Final       IMPRESSION/PLAN: 25. 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  Today, we talked to the patient and family about the findings and workup thus far. We discussed the natural history of multiple myeloma and general treatment, highlighting the role of radiotherapy in the management of painful lesions. We discussed the available radiation techniques, and focused on the details and logistics of delivery. The recommendation is for a 2 week course of postoperative daily radiation to the right shoulder/proximal humerus, to begin approximately 2 weeks after surgery to allow for healing of the surgical incision. He has freely signed written consent to proceed today in the office and a copy of this document will be placed in his medical record. He is scheduled for surgery 12/17/21 so we will coordinate for CT SIM on 12/24/21 in anticipation of beginning his daily radiation the first week of September. We reviewed the anticipated acute and late sequelae associated with radiation in this setting. We also discussed the possibility for treating other painful sites of disease pending his response to systemic therapy. The patient was encouraged to ask questions that were answered to his stated satisfaction. We will share our discussion with Dr. Lindi Adie, Dr. Lorenso Courier and Dr. Griffin Basil and proceed with treatment planning accordingly. We enjoyed meeting him and his wife today and look forward to continuing to participate in his care.  We personally spent 70 minutes in this encounter including chart review, reviewing radiological studies, meeting face-to-face with the patient, entering orders and completing documentation.    Nicholos Johns, PA-C    Tyler Pita, MD  [facility] Direct Dial: [phone]  Fax:  [phone] [email or web address]  Skype  [email or web address]

## 2021-12-10 NOTE — Progress Notes (Signed)
START ON PATHWAY REGIMEN - Multiple Myeloma and Other Plasma Cell Dyscrasias     Cycles 1 and 2: A cycle is every 28 days:     Lenalidomide      Dexamethasone      Daratumumab and hyaluronidase-fihj    Cycles 3 through 6: A cycle is every 28 days:     Lenalidomide      Dexamethasone      Daratumumab and hyaluronidase-fihj    Cycles 7 and beyond: A cycle is every 28 days:     Lenalidomide      Dexamethasone      Daratumumab and hyaluronidase-fihj   **Always confirm dose/schedule in your pharmacy ordering system**  Patient Characteristics: Multiple Myeloma, Newly Diagnosed, Transplant Ineligible or Refused, Standard Risk Disease Classification: Multiple Myeloma R-ISS Staging: II Therapeutic Status: Newly Diagnosed Is Patient Eligible for Transplant<= Transplant Ineligible or Refused Risk Status: Standard Risk Intent of Therapy: Curative Intent, Discussed with Patient

## 2021-12-10 NOTE — Progress Notes (Signed)
[facility] [phone]   [phone]  PROGRESS NOTE  Patient Care Team: Stoneking, Hal, MD as PCP - General (Internal Medicine) Ross, Paula V, MD as PCP - Cardiology (Cardiology) Turner, Traci R, MD as PCP - Sleep Medicine (Cardiology) Allred, James, MD as PCP - Electrophysiology (Cardiology) Gross, Steven, MD as Consulting Physician (General Surgery) Magod, Marc, MD as Consulting Physician (Gastroenterology) Turner, Traci R, MD as Consulting Physician (Sleep Medicine) Patel, Donika K, DO as Consulting Physician (Neurology)  Hematological/Oncological History # IgA Kappa Multiple Myeloma 05/05/2020: Bone marrow biopsy showed increased number of plasma cells representing 14% of all cells in the aspirate associated with numerous variably sized clusters in the clot and biopsy sections 04/10/2021: M protein 0.3, IgA kappa specificity. Kappa 580.5, Labda 7.0, ratio 82.93.   12/07/2021: Labs show of 1971.3, lambda 5.5, ratio 358.42 12/10/2021: Transition care to Dr. Dorsey.  Interval History:  Travis [name] 70 y.o. male with medical history significant for IgA Kappa Multiple Myeloma who presents for a follow up visit. The patient's last visit was on 12/07/2021 with Dr. Gudena. In the interim since the last visit he has had marked worsening of his SFLC, concerning for progression to MM.   On exam today Travis [name] is accompanied by his wife.  He reports that this all began in 2018 and had neuropathy in his feet.  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 Perlov.  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 Kirby at [facility].  He eventually transferred care to Dr. Godina and was monitored for his smoldering multiple myeloma.  Everything been stable until recently when he developed rib cage pain.  As part of his orthopedic evaluation for shoulder pain he had an MRI done which showed concern for lytic lesions of the  humerus and ribs.  He reports that his pain varies anywhere from a 5 to a 10.  When his myeloma labs were rechecked he was found to have a marked increase in his kappa lambda ratio.  Due to concern for progression of multiple myeloma the patient was transferred to Dr. Dorsey for further evaluation and management.  Today the bulk of our discussion focused on the steps moving forward in order to begin treatment for multiple myeloma.  The patient has surgery next week plan to stabilize his arm.  We will wait till his surgery is complete on 12/17/2021 before proceeding with chemotherapy.  Today we discussed what to expect with treatment moving forward.  We discussed Dara/Len/Dex as treatment of choice.  Patient voiced understanding of the plan moving forward.  MEDICAL HISTORY:  Past Medical History:  Diagnosis Date   Arthritis    "comes and goes; mostly in hands, occasionally elbow" (04/05/2017)   Complication of anesthesia    "just wanted to sleep alot  for couple days S/P VARICOCELE OR" (04/05/2017)   DVT (deep venous thrombosis) (HCC)    LLE   Dysrhythmia    PVCs    GERD (gastroesophageal reflux disease)    History of hiatal hernia    Hypertension    Impaired glucose tolerance    Myeloma (HCC)    Obesity (BMI 30-39.9) 07/26/2016   OSA on CPAP 07/26/2016   Mild with AHI 12.5/hr now on CPAP at 14cm H2O   PAF (paroxysmal atrial fibrillation) (HCC)    s/p PVI Ablation in 2018 // Flecainide Rx // Echo 8/21: EF 60-65, no RWMA, mild LVH,   normal RV SF, moderate RVE, mild BAE, trivial MR, mild dilation of aortic root (40 mm)   Pneumonia    "may have had walking pneumonia a few years ago" (04/05/2017)   Pre-diabetes    Thrombophlebitis     SURGICAL HISTORY: Past Surgical History:  Procedure Laterality Date   ATRIAL FIBRILLATION ABLATION  04/05/2017   ATRIAL FIBRILLATION ABLATION N/A 04/05/2017   Procedure: ATRIAL FIBRILLATION ABLATION;  Surgeon: Allred, James, MD;  Location: [facility];   Service: Cardiovascular;  Laterality: N/A;   COMPLETE RIGHT HIP REPLACMENT  01/19/2019   EVALUATION UNDER ANESTHESIA WITH HEMORRHOIDECTOMY N/A 02/24/2017   Procedure: EXAM UNDER ANESTHESIA WITH  HEMORRHOIDECTOMY;  Surgeon: Gross, Steven, MD;  Location: [facility];  Service: General;  Laterality: N/A;   EXCISIONAL TOTAL HIP ARTHROPLASTY WITH ANTIBIOTIC SPACERS Right 09/25/2020   Procedure: EXCISIONAL TOTAL HIP ARTHROPLASTY WITH ANTIBIOTIC SPACERS;  Surgeon: Olin, Matthew, MD;  Location: [facility];  Service: Orthopedics;  Laterality: Right;   FLEXIBLE SIGMOIDOSCOPY N/A 04/15/2015   Procedure:  UNSEDATED FLEXIBLE SIGMOIDOSCOPY;  Surgeon: Martin K Johnson, MD;  Location: [facility];  Service: Endoscopy;  Laterality: N/A;   KNEE ARTHROSCOPY Left ~ 2016   REIMPLANTATION OF TOTAL HIP Right 12/25/2020   Procedure: REIMPLANTATION/REVISION OF RIGHT TOTAL HIP VERSUS REPEAT IRRIGATION AND DEBRIDEMENT;  Surgeon: Olin, Matthew, MD;  Location: [facility];  Service: Orthopedics;  Laterality: Right;   TESTICLE SURGERY  1988   VARICOCELE   TONSILLECTOMY     VARICOSE VEIN SURGERY Bilateral     SOCIAL HISTORY: Social History   Socioeconomic History   Marital status: Married    Spouse name: Not on file   Number of children: 1   Years of education: law school   Highest education level: Not on file  Occupational History   Occupation: lawyer  Tobacco Use   Smoking status: Never   Smokeless tobacco: Never  Vaping Use   Vaping Use: Never used  Substance and Sexual Activity   Alcohol use: Yes    Alcohol/week: 2.0 standard drinks of alcohol    Types: 2 Glasses of wine per week    Comment: 2 glasses of wine a week   Drug use: No   Sexual activity: Not Currently  Other Topics Concern   Not on file  Social History Narrative   Lives in [place] with spouse in a 2 story home.  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     Education: law school.    Social Determinants of Health    Financial Resource Strain: Not on file  Food Insecurity: Not on file  Transportation Needs: Not on file  Physical Activity: Not on file  Stress: Not on file  Social Connections: Not on file  Intimate Partner Violence: Not on file    FAMILY HISTORY: Family History  Problem Relation Age of Onset   Dementia Mother    Stroke Father    Atrial fibrillation Father    Hypertension Father    Hypertension Paternal Grandfather    Dementia Maternal Grandmother     ALLERGIES:  is allergic to linezolid and vancomycin.  MEDICATIONS:  Current Outpatient Medications  Medication Sig Dispense Refill   acyclovir (ZOVIRAX) 400 MG tablet Take 1 tablet (400 mg total) by mouth 2 (two) times daily. 180 tablet 3   dexamethasone (DECADRON) 4 MG tablet Take 10 tablets (40 mg total) by mouth once a week. 40 tablet 5   ondansetron (ZOFRAN) 8 MG   tablet Take 1 tablet (8 mg total) by mouth every 8 (eight) hours as needed. 30 tablet 0   prochlorperazine (COMPAZINE) 10 MG tablet Take 1 tablet (10 mg total) by mouth every 6 (six) hours as needed for nausea or vomiting. 30 tablet 0   amLODipine (NORVASC) 10 MG tablet Take 10 mg by mouth daily.      apixaban (ELIQUIS) 5 MG TABS tablet TAKE ONE TABLET BY MOUTH TWICE A DAY 60 tablet 6   Ascorbic Acid (VITAMIN C) 100 MG tablet Take 100 mg by mouth daily.     b complex vitamins tablet Take 1 tablet by mouth daily.     cholecalciferol (VITAMIN D3) 25 MCG (1000 UNIT) tablet Take 1,000 Units by mouth daily.     clobetasol cream (TEMOVATE) 0.05 % Apply topically.     docusate sodium (COLACE) 100 MG capsule Take 1 capsule (100 mg total) by mouth 2 (two) times daily. (Patient not taking: Reported on 12/10/2021) 10 capsule 0   flecainide (TAMBOCOR) 50 MG tablet TAKE ONE AND ONE-HALF TABLETS BY MOUTH TWICE A DAY 270 tablet 3   fluticasone (FLONASE) 50 MCG/ACT nasal spray Place 1 spray into both nostrils daily as needed for allergies or rhinitis.     folic acid (FOLVITE) 1 MG  tablet Take 1 mg by mouth daily.     furosemide (LASIX) 40 MG tablet Take 1 tablet (40 mg) by mouth every other day alternating with 1 and 1/2 tablets (60 mg) on the opposite days. (Patient not taking: Reported on 12/10/2021) 113 tablet 3   gabapentin (NEURONTIN) 300 MG capsule TAKE TWO CAPSULES BY MOUTH TWICE A DAY 360 capsule 0   hydrALAZINE (APRESOLINE) 10 MG tablet Take 1 tablet (10 mg total) by mouth 2 (two) times daily. AS NEEDED FOR HIGH BP 180 tablet 3   hydrochlorothiazide (HYDRODIURIL) 25 MG tablet TAKE ONE TABLET BY MOUTH ONE TIME DAILY 90 tablet 3   lisinopril (ZESTRIL) 40 MG tablet TAKE ONE TABLET BY MOUTH ONE TIME DAILY 90 tablet 3   metoprolol succinate (TOPROL-XL) 50 MG 24 hr tablet Take 0.5 tablets (25 mg total) by mouth daily. 90 tablet 3   oxymetazoline (AFRIN) 0.05 % nasal spray Place 1 spray into both nostrils 2 (two) times daily as needed for congestion.     potassium chloride SA (KLOR-CON M) 20 MEQ tablet TAKE ONE TABLET BY MOUTH ONE TIME DAILY, TAKE 40 MG (2 TABLETS) ON DAYS THAT YOU ALSO TAKE LASIX (FUROSEMIDE) 135 tablet 3   psyllium (METAMUCIL) 58.6 % packet Take 1 packet by mouth daily.     sodium chloride (OCEAN) 0.65 % SOLN nasal spray Place 1 spray into both nostrils as needed for congestion.     traMADol (ULTRAM) 50 MG tablet Take 50 mg by mouth every 6 (six) hours as needed.     triamcinolone cream (KENALOG) 0.1 % Apply 1 application topically daily as needed (itching).     No current facility-administered medications for this visit.    REVIEW OF SYSTEMS:   Constitutional: ( - ) fevers, ( - )  chills , ( - ) night sweats Eyes: ( - ) blurriness of vision, ( - ) double vision, ( - ) watery eyes Ears, nose, mouth, throat, and face: ( - ) mucositis, ( - ) sore throat Respiratory: ( - ) cough, ( - ) dyspnea, ( - ) wheezes Cardiovascular: ( - ) palpitation, ( - ) chest discomfort, ( - ) lower extremity swelling Gastrointestinal:  ( - )   nausea, ( - ) heartburn, ( - )  change in bowel habits Skin: ( - ) abnormal skin rashes Lymphatics: ( - ) new lymphadenopathy, ( - ) easy bruising Neurological: ( - ) numbness, ( - ) tingling, ( - ) new weaknesses Behavioral/Psych: ( - ) mood change, ( - ) new changes  All other systems were reviewed with the patient and are negative.  PHYSICAL EXAMINATION: ECOG PERFORMANCE STATUS: 1 - Symptomatic but completely ambulatory  Vitals:   12/10/21 1334  BP: (!) 168/82  Pulse: 63  Resp: 16  Temp: 98.1 F (36.7 C)  SpO2: 97%   Filed Weights   12/10/21 1334  Weight: 254 lb 4.8 oz (115.3 kg)    GENERAL: Well-appearing elderly Caucasian male, alert, no distress and comfortable SKIN: skin color, texture, turgor are normal, no rashes or significant lesions EYES: conjunctiva are pink and non-injected, sclera clear LUNGS: clear to auscultation and percussion with normal breathing effort HEART: regular rate & rhythm and no murmurs and no lower extremity edema Musculoskeletal: no cyanosis of digits and no clubbing  PSYCH: alert & oriented x 3, fluent speech NEURO: no focal motor/sensory deficits  LABORATORY DATA:  I have reviewed the data as listed    Latest Ref Rng & Units 12/07/2021    3:39 PM 11/17/2021    3:13 PM 04/15/2021    4:38 PM  CBC  WBC 4.0 - 10.5 K/uL 9.8  7.2  8.0   Hemoglobin 13.0 - 17.0 g/dL 15.0  15.4  14.5   Hematocrit 39.0 - 52.0 % 42.1  43.8  43.0   Platelets 150 - 400 K/uL 284  201  257        Latest Ref Rng & Units 12/07/2021    3:39 PM 11/17/2021    3:13 PM 04/10/2021   10:12 AM  CMP  Glucose 70 - 99 mg/dL 109  85  114   BUN 8 - 23 mg/dL 19  13  17   Creatinine 0.61 - 1.24 mg/dL 0.83  0.80  0.88   Sodium 135 - 145 mmol/L 140  143  141   Potassium 3.5 - 5.1 mmol/L 3.4  3.5  3.5   Chloride 98 - 111 mmol/L 103  102  106   CO2 22 - 32 mmol/L 28  27  25   Calcium 8.9 - 10.3 mg/dL 9.7  9.4  9.0   Total Protein 6.5 - 8.1 g/dL 7.2  6.5  6.8   Total Bilirubin 0.3 - 1.2 mg/dL 0.4  0.3  0.5    Alkaline Phos 38 - 126 U/L 103  110  112   AST 15 - 41 U/L 20  24  21   ALT 0 - 44 U/L 26  25  23     Lab Results  Component Value Date   MPROTEIN 0.3 (H) 04/10/2021   MPROTEIN 0.3 (H) 05/05/2020   MPROTEIN 0.2 (H) 03/24/2020   Lab Results  Component Value Date   KPAFRELGTCHN 1,971.3 (H) 12/07/2021   KPAFRELGTCHN 580.5 (H) 04/10/2021   KPAFRELGTCHN 598.0 (H) 05/05/2020   LAMBDASER 5.5 (L) 12/07/2021   LAMBDASER 7.0 04/10/2021   LAMBDASER 8.2 05/05/2020   KAPLAMBRATIO 358.42 (H) 12/07/2021   KAPLAMBRATIO 82.93 (H) 04/10/2021   KAPLAMBRATIO 72.93 (H) 05/05/2020     RADIOGRAPHIC STUDIES: ECHOCARDIOGRAM COMPLETE  Result Date: 12/02/2021    ECHOCARDIOGRAM REPORT   Patient Name:   Travis [name] Date of Exam: 12/02/2021 Medical Rec #:  [id number]          Height:       70.0 in Accession #:    [id number]       Weight:       261.2 lb Date of Birth:  09/06/1950       BSA:          2.338 m Patient Age:    70 years         BP:           144/80 mmHg Patient Gender: M                HR:           55 bpm. Exam Location:  [address] Procedure: 2D Echo, Strain Analysis, Color Doppler, Cardiac Doppler and 3D Echo Indications:    Dyspnea R06.00  History:        Patient has prior history of Echocardiogram examinations, most                 recent 12/04/2019. Arrythmias:Atrial Fibrillation,                 Signs/Symptoms:Edema and Fatigue; Risk Factors:Hypertension.  Sonographer:    Casey Kirkpatrick RDCS Referring Phys: PAULA V ROSS IMPRESSIONS  1. Global longitudinal strain is -20.7%. Left ventricular ejection fraction, by estimation, is 60 to 65%. The left ventricle has normal function. The left ventricle has no regional wall motion abnormalities. Left ventricular diastolic parameters were normal.  2. Right ventricular systolic function is normal. The right ventricular size is normal.  3. The mitral valve is normal in structure. Mild mitral valve regurgitation.  4. The aortic valve is normal in structure.  Aortic valve regurgitation is not visualized.  5. The inferior vena cava is normal in size with greater than 50% respiratory variability, suggesting right atrial pressure of 3 mmHg. FINDINGS  Left Ventricle: Global longitudinal strain is -20.7%. Left ventricular ejection fraction, by estimation, is 60 to 65%. The left ventricle has normal function. The left ventricle has no regional wall motion abnormalities. The left ventricular internal cavity size was normal in size. There is no left ventricular hypertrophy. Left ventricular diastolic parameters were normal. Right Ventricle: The right ventricular size is normal. Right vetricular wall thickness was not assessed. Right ventricular systolic function is normal. Left Atrium: Left atrial size was normal in size. Right Atrium: Right atrial size was normal in size. Pericardium: There is no evidence of pericardial effusion. Mitral Valve: The mitral valve is normal in structure. Mild mitral valve regurgitation. Tricuspid Valve: The tricuspid valve is normal in structure. Tricuspid valve regurgitation is trivial. Aortic Valve: The aortic valve is normal in structure. Aortic valve regurgitation is not visualized. Pulmonic Valve: The pulmonic valve was normal in structure. Pulmonic valve regurgitation is trivial. Aorta: The aortic root and ascending aorta are structurally normal, with no evidence of dilitation. Venous: The inferior vena cava is normal in size with greater than 50% respiratory variability, suggesting right atrial pressure of 3 mmHg. IAS/Shunts: No atrial level shunt detected by color flow Doppler.  LEFT VENTRICLE PLAX 2D LVIDd:         5.30 cm   Diastology LVIDs:         3.20 cm   LV e' medial:    7.36 cm/s LV PW:         1.10 cm   LV E/e' medial:  9.8 LV IVS:        1.00 cm   LV e' lateral:   11.50 cm/s LVOT diam:       2.50 cm   LV E/e' lateral: 6.3 LV SV:         107 LV SV Index:   46 LVOT Area:     4.91 cm                           3D Volume EF:                           3D EF:        57 %                          LV EDV:       172 ml                          LV ESV:       74 ml                          LV SV:        99 ml RIGHT VENTRICLE RV Basal diam:  4.40 cm RV Mid diam:    3.10 cm RV S prime:     16.00 cm/s TAPSE (M-mode): 2.8 cm LEFT ATRIUM             Index        RIGHT ATRIUM           Index LA diam:        4.30 cm 1.84 cm/m   RA Area:     21.70 cm LA Vol (A2C):   66.8 ml 28.57 ml/m  RA Volume:   60.80 ml  26.00 ml/m LA Vol (A4C):   69.4 ml 29.68 ml/m LA Biplane Vol: 70.9 ml 30.32 ml/m  AORTIC VALVE LVOT Vmax:   96.20 cm/s LVOT Vmean:  61.800 cm/s LVOT VTI:    0.217 m  AORTA Ao Root diam: 3.80 cm Ao Asc diam:  3.80 cm MITRAL VALVE MV Area (PHT): 3.27 cm    SHUNTS MV Decel Time: 232 msec    Systemic VTI:  0.22 m MV E velocity: 72.00 cm/s  Systemic Diam: 2.50 cm MV A velocity: 32.50 cm/s MV E/A ratio:  2.22 Paula Ross MD Electronically signed by Paula Ross MD Signature Date/Time: 12/02/2021/4:04:21 PM    Final     ASSESSMENT & PLAN Travis [name] 70 y.o. male with medical history significant for IgA Kappa Multiple Myeloma who presents for a follow up visit.   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  He meets diagnostic criteria by having a serum free light chain ratio greater than 100 and lytic lesions causing pathological fracture of the humerus.  Given this I would recommend we pursued Darzalex, Revlimid, and dexamethasone.  Given his advanced age I would not recommend transplantation, that this is something we can consider when it comes time to transition to maintenance therapy versus transplant.  The patient voices understanding of the plan moving forward.  # IgA Kappa Multiple Myeloma -- Today ordered 24-hour urine protein to assess for proteinuria. --Metastatic survey ordered and scheduled for 12/15/2021 --Patient undergo fixation of his  humerus on 12/17/2021 --Plan to proceed with Darzalex, Revlimid, and dexamethasone as treatment of choice. --Plan to have patient return to   clinic in approximately 2 weeks time after he has recovered from his surgery to begin treatment.  #Supportive Care -- chemotherapy education to be scheduled  -- port placement not required -- zofran 8mg q8H PRN and compazine 10mg PO q6H for nausea -- acyclovir 400mg PO BID for VCZ prophylaxis -- Zometa/Xgeva treatment to start once dental clearance is obtained. -- Pain is managed right now with tramadol.  Offered stronger pain medication if pain were to worsen.    Orders Placed This Encounter  Procedures   24-Hr Ur UPEP/UIFE/Light Chains/TP    Standing Status:   Future    Standing Expiration Date:   12/10/2022   CBC with Differential (Cancer Center Only)    Standing Status:   Future    Standing Expiration Date:   12/29/2022   CBC with Differential (Cancer Center Only)    Standing Status:   Future    Standing Expiration Date:   01/05/2023   CBC with Differential (Cancer Center Only)    Standing Status:   Future    Standing Expiration Date:   01/12/2023   CBC with Differential (Cancer Center Only)    Standing Status:   Future    Standing Expiration Date:   01/19/2023   CMP (Cancer Center only)    Standing Status:   Future    Standing Expiration Date:   12/29/2022   Lactate dehydrogenase (LDH)    Standing Status:   Future    Standing Expiration Date:   12/28/2022   CMP (Cancer Center only)    Standing Status:   Future    Standing Expiration Date:   01/05/2023   Lactate dehydrogenase (LDH)    Standing Status:   Future    Standing Expiration Date:   01/04/2023   CMP (Cancer Center only)    Standing Status:   Future    Standing Expiration Date:   01/12/2023   Lactate dehydrogenase (LDH)    Standing Status:   Future    Standing Expiration Date:   01/11/2023   CMP (Cancer Center only)    Standing Status:   Future    Standing Expiration Date:   01/19/2023    Lactate dehydrogenase (LDH)    Standing Status:   Future    Standing Expiration Date:   01/18/2023    All questions were answered. The patient knows to call the clinic with any problems, questions or concerns.  A total of more than 40 minutes were spent on this encounter with face-to-face time and non-face-to-face time, including preparing to see the patient, ordering tests and/or medications, counseling the patient and coordination of care as outlined above.   John T. Dorsey, MD Department of Hematology/Oncology  [facility] Phone: [phone] Pager: [phone] Email: [email or web address]  12/10/2021 5:21 PM  

## 2021-12-11 ENCOUNTER — Encounter (HOSPITAL_BASED_OUTPATIENT_CLINIC_OR_DEPARTMENT_OTHER): Payer: Self-pay | Admitting: Orthopaedic Surgery

## 2021-12-11 ENCOUNTER — Other Ambulatory Visit: Payer: Self-pay

## 2021-12-11 ENCOUNTER — Telehealth: Payer: Self-pay | Admitting: *Deleted

## 2021-12-11 LAB — MULTIPLE MYELOMA PANEL, SERUM
Albumin SerPl Elph-Mcnc: 3.7 g/dL (ref 2.9–4.4)
Albumin/Glob SerPl: 1.3 (ref 0.7–1.7)
Alpha 1: 0.3 g/dL (ref 0.0–0.4)
Alpha2 Glob SerPl Elph-Mcnc: 0.9 g/dL (ref 0.4–1.0)
B-Globulin SerPl Elph-Mcnc: 1 g/dL (ref 0.7–1.3)
Gamma Glob SerPl Elph-Mcnc: 0.7 g/dL (ref 0.4–1.8)
Globulin, Total: 2.9 g/dL (ref 2.2–3.9)
IgA: 328 mg/dL (ref 61–437)
IgG (Immunoglobin G), Serum: 427 mg/dL — ABNORMAL LOW (ref 603–1613)
IgM (Immunoglobulin M), Srm: 14 mg/dL — ABNORMAL LOW (ref 20–172)
M Protein SerPl Elph-Mcnc: 0.2 g/dL — ABNORMAL HIGH
Total Protein ELP: 6.6 g/dL (ref 6.0–8.5)

## 2021-12-11 NOTE — Telephone Encounter (Signed)
Agreeable to tele pre op appt 12/14/21 as add-on due to blood thinners to be held. Pt tells me the surgeon said he can take his Eliquis on Monday, but then hold until after procedure. I assured the pt that our pre op provider will go over the recommendations as well.    Med rec and consent are done.

## 2021-12-11 NOTE — Telephone Encounter (Signed)
   Name: Travis [name]  DOB: [birth year]  MRN: [id number]  Primary Cardiologist: [name], MD  Chart reviewed as part of pre-operative protocol coverage. Because of Travis [name]'s past medical history and time since last visit, he will require a follow-up telephone visit in order to better assess preoperative cardiovascular risk.  Pre-op covering staff: - Please schedule appointment and call patient to inform them. If patient already had an upcoming appointment within acceptable timeframe, please add "pre-op clearance" to the appointment notes so provider is aware. - Please contact requesting surgeon's office via preferred method (i.e, phone, fax) to inform them of need for appointment prior to surgery.  Anticoagulation hold already addressed  Elgie Collard, PA-C  12/11/2021, 8:25 AM

## 2021-12-11 NOTE — Telephone Encounter (Signed)
Patient with diagnosis of afib on Eliquis for anticoagulation.    Procedure: right humerus fracture, bone biopsy Date of procedure: 12/17/21  CHA2DS2-VASc Score = 2  This indicates a 2.2% annual risk of stroke. The patient's score is based upon: CHF History: 0 HTN History: 1 Diabetes History: 0 Stroke History: 0 Vascular Disease History: 0 Age Score: 1 Gender Score: 0  CrCl >159m/min Platelet count 284K  Per office protocol, patient can hold Eliquis for 2-3 days prior to procedure.    **This guidance is not considered finalized until pre-operative APP has relayed final recommendations.**

## 2021-12-11 NOTE — Telephone Encounter (Signed)
Agreeable to tele pre op appt 12/14/21 as add-on due to blood thinners to be held. Pt tells me the surgeon said he can take his Eliquis on Monday, but then hold until after procedure. I assured the pt that our pre op provider will go over the recommendations as well.   Med rec and consent are done.     Patient Consent for Virtual Visit        Travis [name] has provided verbal consent on 12/11/2021 for a virtual visit (video or telephone).   CONSENT FOR VIRTUAL VISIT FOR:  Travis [name]  By participating in this virtual visit I agree to the following:  I hereby voluntarily request, consent and authorize [facility] and its employed or contracted physicians, physician assistants, nurse practitioners or other licensed health care professionals (the Practitioner), to provide me with telemedicine health care services (the "Services") as deemed necessary by the treating Practitioner. I acknowledge and consent to receive the Services by the Practitioner via telemedicine. I understand that the telemedicine visit will involve communicating with the Practitioner through live audiovisual communication technology and the disclosure of certain medical information by electronic transmission. I acknowledge that I have been given the opportunity to request an in-person assessment or other available alternative prior to the telemedicine visit and am voluntarily participating in the telemedicine visit.  I understand that I have the right to withhold or withdraw my consent to the use of telemedicine in the course of my care at any time, without affecting my right to future care or treatment, and that the Practitioner or I may terminate the telemedicine visit at any time. I understand that I have the right to inspect all information obtained and/or recorded in the course of the telemedicine visit and may receive copies of available information for a reasonable fee.  I understand that some of the potential risks  of receiving the Services via telemedicine include:  Delay or interruption in medical evaluation due to technological equipment failure or disruption; Information transmitted may not be sufficient (e.g. poor resolution of images) to allow for appropriate medical decision making by the Practitioner; and/or  In rare instances, security protocols could fail, causing a breach of personal health information.  Furthermore, I acknowledge that it is my responsibility to provide information about my medical history, conditions and care that is complete and accurate to the best of my ability. I acknowledge that Practitioner's advice, recommendations, and/or decision may be based on factors not within their control, such as incomplete or inaccurate data provided by me or distortions of diagnostic images or specimens that may result from electronic transmissions. I understand that the practice of medicine is not an exact science and that Practitioner makes no warranties or guarantees regarding treatment outcomes. I acknowledge that a copy of this consent can be made available to me via my patient portal ([facility]), or I can request a printed copy by calling the office of [facility].    I understand that my insurance will be billed for this visit.   I have read or had this consent read to me. I understand the contents of this consent, which adequately explains the benefits and risks of the Services being provided via telemedicine.  I have been provided ample opportunity to ask questions regarding this consent and the Services and have had my questions answered to my satisfaction. I give my informed consent for the services to be provided through the use of telemedicine in my medical care

## 2021-12-13 NOTE — Progress Notes (Addendum)
 Virtual Visit via Telephone Note   Because of Travis [name]'s co-morbid illnesses, he is at least at moderate risk for complications without adequate follow up.  This format is felt to be most appropriate for this patient at this time.  The patient did not have access to video technology/had technical difficulties with video requiring transitioning to audio format only (telephone).  All issues noted in this document were discussed and addressed.  No physical exam could be performed with this format.  Please refer to the patient's chart for his consent to telehealth for [facility].  Evaluation Performed:  Preoperative Cardiovascular Risk Assessment _____________   Date:  12/14/2021   Patient ID:  Travis [name], DOB 04/14/1951, MRN [id number] Patient Location:  Home Provider location:   Office  Primary Care Provider:  Stoneking, Hal, MD Primary Cardiologist:  Paula Ross, MD  Chief Complaint / Patient Profile   Travis [name] is a 71 y.o. year old male with a history of normal coronaries on cardiac CTA in 2018, paroxysmal atrial fibrillation on Flecanide and Eliquis, hypertension, prior DVT, peripheral neuropathy, obstructive sleep apnea, and MGUS who is pending right humerus fracture and bone biopsy and presents today for telephonic preoperative cardiovascular risk assessment.  Past Medical History    Past Medical History:  Diagnosis Date   Arthritis    "comes and goes; mostly in hands, occasionally elbow" (04/05/2017)   Complication of anesthesia    "just wanted to sleep alot  for couple days S/P VARICOCELE OR" (04/05/2017)   DVT (deep venous thrombosis) (HCC)    LLE   Dysrhythmia    PVCs    GERD (gastroesophageal reflux disease)    History of hiatal hernia    Hypertension    Impaired glucose tolerance    Multiple myeloma (HCC)    Obesity (BMI 30-39.9) 07/26/2016   OSA on CPAP 07/26/2016   Mild with AHI 12.5/hr now on CPAP at 14cm H2O   PAF (paroxysmal atrial  fibrillation) (HCC)    s/p PVI Ablation in 2018 // Flecainide Rx // Echo 8/21: EF 60-65, no RWMA, mild LVH, normal RV SF, moderate RVE, mild BAE, trivial MR, mild dilation of aortic root (40 mm)   Pneumonia    "may have had walking pneumonia a few years ago" (04/05/2017)   Pre-diabetes    Thrombophlebitis    Past Surgical History:  Procedure Laterality Date   ATRIAL FIBRILLATION ABLATION  04/05/2017   ATRIAL FIBRILLATION ABLATION N/A 04/05/2017   Procedure: ATRIAL FIBRILLATION ABLATION;  Surgeon: Allred, James, MD;  Location: [facility];  Service: Cardiovascular;  Laterality: N/A;   COMPLETE RIGHT HIP REPLACMENT  01/19/2019   EVALUATION UNDER ANESTHESIA WITH HEMORRHOIDECTOMY N/A 02/24/2017   Procedure: EXAM UNDER ANESTHESIA WITH  HEMORRHOIDECTOMY;  Surgeon: Gross, Steven, MD;  Location: [facility];  Service: General;  Laterality: N/A;   EXCISIONAL TOTAL HIP ARTHROPLASTY WITH ANTIBIOTIC SPACERS Right 09/25/2020   Procedure: EXCISIONAL TOTAL HIP ARTHROPLASTY WITH ANTIBIOTIC SPACERS;  Surgeon: Olin, Matthew, MD;  Location: [facility];  Service: Orthopedics;  Laterality: Right;   FLEXIBLE SIGMOIDOSCOPY N/A 04/15/2015   Procedure:  UNSEDATED FLEXIBLE SIGMOIDOSCOPY;  Surgeon: Martin K Johnson, MD;  Location: [facility];  Service: Endoscopy;  Laterality: N/A;   KNEE ARTHROSCOPY Left ~ 2016   REIMPLANTATION OF TOTAL HIP Right 12/25/2020   Procedure: REIMPLANTATION/REVISION OF RIGHT TOTAL HIP VERSUS REPEAT IRRIGATION AND DEBRIDEMENT;  Surgeon: Olin, Matthew, MD;  Location: [facility];  Service: Orthopedics;  Laterality: Right;     TESTICLE SURGERY  1988   VARICOCELE   TONSILLECTOMY     VARICOSE VEIN SURGERY Bilateral     Allergies  Allergies  Allergen Reactions   Linezolid Other (See Comments)    Burning tongue    Vancomycin Rash    History of Present Illness    Travis [name] is a 71 y.o. male who presents via audio/video conferencing for a telehealth visit today.  Patient was last seen in  our office on 11/17/2021 by [name].  At that time, he was having some lower extremity swelling but was otherwise doing okay from a cardiac standpoint. Lasix was adjusted and Echo was ordered. Echo on 12/02/2021 showed LVEF of 60-65% with normal wall motion and diastolic parameters, normal RV, and mild MR.  He is now pending procedure as outlined above. Since his last visit, he has been doing pretty well. He had one episode of recurrent atrial fibrillation with rates in the 130s on 11/20/2021 that lasted about 11.5 hours. He had a couple instances where he felt like he was going to pass out with this (especially when he converted back to sinus rhythm and his heart rates quickly went from the 130s to he low 50s). However, he denies any syncope. He has not had any recurrent atrial fibrillation since then and no recurrent near syncope since being back in sinus rhythm. He lower extremity edema is better. He states he stopped taking Lasix about 10 days ago because it was too difficult to get up frequently to urinate with his significant arm pain. However, he has not had any significant lower extremity edema since stopping this. He is hoping the surgery will help the pain and then he is planning to restart Lasix. He denies any chest pain, shortness of breath, orthopnea, or PND.   Home Medications    Prior to Admission medications   Medication Sig Start Date End Date Taking? Authorizing Provider  acyclovir (ZOVIRAX) 400 MG tablet Take 1 tablet (400 mg total) by mouth 2 (two) times daily. 12/10/21   Orson Slick, MD  amLODipine (NORVASC) 10 MG tablet Take 10 mg by mouth daily.     [provider]  apixaban (ELIQUIS) 5 MG TABS tablet TAKE ONE TABLET BY MOUTH TWICE A DAY 08/12/21   Fay Records, MD  Ascorbic Acid (VITAMIN C) 100 MG tablet Take 100 mg by mouth daily.    [provider]  b complex vitamins tablet Take 1 tablet by mouth daily.    [provider]  cholecalciferol (VITAMIN  D3) 25 MCG (1000 UNIT) tablet Take 1,000 Units by mouth daily.    [provider]  clobetasol cream (TEMOVATE) 0.05 % Apply topically. 08/05/21   [provider]  dexamethasone (DECADRON) 4 MG tablet Take 10 tablets (40 mg total) by mouth once a week. 12/10/21   Orson Slick, MD  flecainide (TAMBOCOR) 50 MG tablet TAKE ONE AND ONE-HALF TABLETS BY MOUTH TWICE A DAY 07/30/21   Fay Records, MD  fluticasone [facility]) 50 MCG/ACT nasal spray Place 1 spray into both nostrils daily as needed for allergies or rhinitis.    [provider]  folic acid (FOLVITE) 1 MG tablet Take 1 mg by mouth daily. 11/12/19   [provider]  furosemide (LASIX) 40 MG tablet Take 1 tablet (40 mg) by mouth every other day alternating with 1 and 1/2 tablets (60 mg) on the opposite days. 11/18/21   Fay Records, MD  gabapentin (  NEURONTIN) 300 MG capsule TAKE TWO CAPSULES BY MOUTH TWICE A DAY 12/04/21   Patel, Arvin Collard K, DO  hydrALAZINE (APRESOLINE) 10 MG tablet Take 1 tablet (10 mg total) by mouth 2 (two) times daily. AS NEEDED FOR HIGH BP 12/07/21   Fay Records, MD  hydrochlorothiazide (HYDRODIURIL) 25 MG tablet TAKE ONE TABLET BY MOUTH ONE TIME DAILY 08/12/21   Fay Records, MD  lisinopril (ZESTRIL) 40 MG tablet TAKE ONE TABLET BY MOUTH ONE TIME DAILY 08/12/21   Fay Records, MD  metoprolol succinate (TOPROL-XL) 50 MG 24 hr tablet Take 0.5 tablets (25 mg total) by mouth daily. 11/17/21   Fay Records, MD  ondansetron (ZOFRAN) 8 MG tablet Take 1 tablet (8 mg total) by mouth every 8 (eight) hours as needed. 12/10/21   Orson Slick, MD  oxymetazoline (AFRIN) 0.05 % nasal spray Place 1 spray into both nostrils 2 (two) times daily as needed for congestion.    [provider]  potassium chloride SA (KLOR-CON M) 20 MEQ tablet TAKE ONE TABLET BY MOUTH ONE TIME DAILY, TAKE 40 MG (2 TABLETS) ON DAYS THAT YOU ALSO TAKE LASIX (FUROSEMIDE) 10/28/21   Fay Records, MD  prochlorperazine (COMPAZINE)  10 MG tablet Take 1 tablet (10 mg total) by mouth every 6 (six) hours as needed for nausea or vomiting. 12/10/21   Orson Slick, MD  psyllium (METAMUCIL) 58.6 % packet Take 1 packet by mouth daily.    [provider]  sodium chloride (OCEAN) 0.65 % SOLN nasal spray Place 1 spray into both nostrils as needed for congestion.    [provider]  traMADol (ULTRAM) 50 MG tablet Take 50 mg by mouth every 6 (six) hours as needed. 12/07/21   [provider]  triamcinolone cream (KENALOG) 0.1 % Apply 1 application topically daily as needed (itching). 06/05/19   [provider]    Physical Exam    Vital Signs:  Travis [name] does not have vital signs available for review today.  Given telephonic nature of communication, physical exam is limited. Alert and oriented x3. No acute distress. Normal affect. Speech and respirations are unlabored.  Accessory Clinical Findings    None  Assessment & Plan    Preoperative Cardiovascular Risk Assessment: Patient has an upcoming right humerus fracture and bone biopsy planned. He is stable from a cardiac standpoint. He had one recurrent episode of atrial fibrillation about 3 weeks ago that lasted for about 11.5 hours with some associated near syncope. However, no recurrent symptoms since restoration of sinus rhythm. No angina or acute CHF symptoms. Per Revised Cardiac Risk Index, considered very low risk with 0.4% chance of MI, pulmonary edema, ventricular fibrillation, cardiac arrest, or complete heart block. Therefore, based on ACC/AHA guidelines, patient would be at acceptable risk for the planned procedure without further cardiovascular testing. Per Pharmacy and office protocol, Eliquis can be held for 2-3 days prior to procedure. Please restart this as soon as safely possible afterwards.  Recommend continuing Flecainide and Toprol-XL perioperatively to help prevent recurrent atrial fibrillation. I will route this  recommendation to the requesting party via Epic fax function.   Lower Extremity Edema Patient was seen in the office last month and reported some worsening lower extremity edema. Echo showed normal LV function and normal diastolic function. [name] adjusted his Lasix and patient was instructed to alternate taking Lasix 64m and 658mevery other day. However, patient states he stopped taking this 10 days  ago because it was too difficult to get up frequently to urinate with his significant arm pain. However, he has not had any significant lower extremity edema since stopping this. He is hoping the surgery will help the pain and then he is planning to restart Lasix. Encouraged him to at least take this as needed for weight gain and worsening lower extremity edema if he is not able to take it on a daily basis.  Time:   Today, I have spent 7 minutes with the patient with telehealth technology discussing medical history, symptoms, and management plan.     Travis E Goodrich, PA-C  12/14/2021, 11:04 AM 

## 2021-12-14 ENCOUNTER — Ambulatory Visit (INDEPENDENT_AMBULATORY_CARE_PROVIDER_SITE_OTHER): Payer: Medicare Other | Admitting: Student

## 2021-12-14 ENCOUNTER — Telehealth: Payer: Self-pay | Admitting: *Deleted

## 2021-12-14 DIAGNOSIS — R600 Localized edema: Secondary | ICD-10-CM

## 2021-12-14 DIAGNOSIS — Z01810 Encounter for preprocedural cardiovascular examination: Secondary | ICD-10-CM | POA: Diagnosis not present

## 2021-12-14 NOTE — Telephone Encounter (Signed)
Received call  from pt. He had several questions related to the start of his treatment for mulitple myeloma.   #!  He is will be picking up 4 prescriptions today in preparation for this treatment. Advised he did not need to start the 2 meds for nausea, he will start the dexamethasone the morning of his treatment on 12/28/21. He will start the Acyclovir a couple of days prior to treatment start.  #2 Reviewed his appts with him. He was concerned about one day with an early appt start (8am )  I was able to cancel lab and MD visit on 8/28 as he will be getting labs on 8/24 and MD visit as well.    #3 Reviewed rest of his schedule as it relates to his surgery on Thursday, Bone scan on Tuesday.  #4 He states he has experienced new pain in one of his hips. It is worse in the morning when he first gets up. It does seem to get better as the day goes on. Advised that we will know if there are any issues in other bones after the bone survey on 8/15/123.  Pt was very appreciative of the above information and help with meds and schedule.

## 2021-12-15 ENCOUNTER — Ambulatory Visit (HOSPITAL_COMMUNITY)
Admission: RE | Admit: 2021-12-15 | Discharge: 2021-12-15 | Disposition: A | Discharge status: Home / Self Care | Payer: Medicare Other | Source: Ambulatory Visit | Attending: Hematology and Oncology | Admitting: Hematology and Oncology

## 2021-12-15 DIAGNOSIS — C9000 Multiple myeloma not having achieved remission: Secondary | ICD-10-CM | POA: Diagnosis not present

## 2021-12-15 DIAGNOSIS — D472 Monoclonal gammopathy: Secondary | ICD-10-CM | POA: Diagnosis not present

## 2021-12-15 NOTE — H&P (Signed)
PREOPERATIVE H&P  Chief Complaint: left humerus pathlogical fracture  HPI: Travis [name] is a 71 y.o. male who is scheduled for, Procedure(s): INTRAMEDULLARY (IM) NAIL HUMERAL BONE BIOPSY.   Patient has a past medical history significant for DVT, PVCs, GERD, HTN, multiple myeloma, OSA on CPAP, PAF, pre-diabetes.   The patient is seen in the past for bilateral shoulder pain. He is a retired Forensic psychologist.  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 He was seen at [facility] and at [facility] for that.  He has had pain in the right shoulder since June with painting and golfing. He is unable to raise his arm overhead. He tried a Dosepak from his primary care physician which was not helpful. He has Tramadol at home. His x-rays at the office were concerning for lytic lesions. He obtained a MRI of the right arm. MRI of the right shoulder and humerus on 12/04/2021 which showed multiple lesions that are expansile and lytic within the humerus.  There is erosion of the posterior and inferior cortex of the acromion, as well as involvement of the humeral shaft itself. His case was discussed urgently with his oncologist. It was decided to perform IM nail of the right humerus for impending pathologic fracture as well as possible surgical biopsy.   Symptoms are rated as moderate to severe, and have been worsening.  This is significantly impairing activities of daily living.    Please see clinic note for further details on this patient's care.    He has elected for surgical management.   Past Medical History:  Diagnosis Date   Arthritis    "comes and goes; mostly in hands, occasionally elbow" ([birth year])   Complication of anesthesia    "just wanted to sleep alot  for couple days S/P VARICOCELE OR" (04/05/2017)   DVT (deep venous thrombosis) (HCC)    LLE   Dysrhythmia    PVCs    GERD (gastroesophageal reflux disease)    History of hiatal hernia    Hypertension    Impaired glucose  tolerance    Multiple myeloma (HCC)    Obesity (BMI 30-39.9) 07/26/2016   OSA on CPAP 07/26/2016   Mild with AHI 12.5/hr now on CPAP at 14cm H2O   PAF (paroxysmal atrial fibrillation) ([place])    s/p PVI Ablation in 2018 // Flecainide Rx // Echo 8/21: EF 60-65, no RWMA, mild LVH, normal RV SF, moderate RVE, mild BAE, trivial MR, mild dilation of aortic root (40 mm)   Pneumonia    "may have had walking pneumonia a few years ago" (04/05/2017)   Pre-diabetes    Thrombophlebitis    Past Surgical History:  Procedure Laterality Date   ATRIAL FIBRILLATION ABLATION  04/05/2017   ATRIAL FIBRILLATION ABLATION N/A 04/05/2017   Procedure: ATRIAL FIBRILLATION ABLATION;  Surgeon: Thompson Grayer, MD;  Location: [facility];  Service: Cardiovascular;  Laterality: N/A;   COMPLETE RIGHT HIP REPLACMENT  01/19/2019   EVALUATION UNDER ANESTHESIA WITH HEMORRHOIDECTOMY N/A 02/24/2017   Procedure: EXAM UNDER ANESTHESIA WITH  HEMORRHOIDECTOMY;  Surgeon: Michael Boston, MD;  Location: [facility];  Service: General;  Laterality: N/A;   EXCISIONAL TOTAL HIP ARTHROPLASTY WITH ANTIBIOTIC SPACERS Right 09/25/2020   Procedure: EXCISIONAL TOTAL HIP ARTHROPLASTY WITH ANTIBIOTIC SPACERS;  Surgeon: Paralee Cancel, MD;  Location: [facility];  Service: Orthopedics;  Laterality: Right;   FLEXIBLE SIGMOIDOSCOPY N/A 04/15/2015   Procedure:  UNSEDATED FLEXIBLE SIGMOIDOSCOPY;  Surgeon: [name], MD;  Location:  [facility];  Service: Endoscopy;  Laterality: N/A;   KNEE ARTHROSCOPY Left ~ 2016   REIMPLANTATION OF TOTAL HIP Right 12/25/2020   Procedure: REIMPLANTATION/REVISION OF RIGHT TOTAL HIP VERSUS REPEAT IRRIGATION AND DEBRIDEMENT;  Surgeon: Paralee Cancel, MD;  Location: [facility];  Service: Orthopedics;  Laterality: Right;   TESTICLE SURGERY  1988   VARICOCELE   TONSILLECTOMY     VARICOSE VEIN SURGERY Bilateral    Social History   Socioeconomic History   Marital status: Married    Spouse name: Not on file   Number of  children: 1   Years of education: law school   Highest education level: Not on file  Occupational History   Occupation: lawyer  Tobacco Use   Smoking status: Never   Smokeless tobacco: Never  Vaping Use   Vaping Use: Never used  Substance and Sexual Activity   Alcohol use: Yes    Alcohol/week: 2.0 standard drinks of alcohol    Types: 2 Glasses of wine per week    Comment: 2 glasses of wine a week, occ bourbon, beer   Drug use: No   Sexual activity: Not Currently  Other Topics Concern   Not on file  Social History Narrative   Lives in [place] with spouse in a 2 story home.  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    Social Determinants of Health   Financial Resource Strain: Not on file  Food Insecurity: Not on file  Transportation Needs: Not on file  Physical Activity: Not on file  Stress: Not on file  Social Connections: Not on file   Family History  Problem Relation Age of Onset   Dementia Mother    Stroke Father    Atrial fibrillation Father    Hypertension Father    Hypertension Paternal Grandfather    Dementia Maternal Grandmother    Allergies  Allergen Reactions   Linezolid Other (See Comments)    Burning tongue    Vancomycin Rash   Prior to Admission medications   Medication Sig Start Date End Date Taking? Authorizing Provider  amLODipine (NORVASC) 10 MG tablet Take 10 mg by mouth daily.    Yes [provider]  apixaban (ELIQUIS) 5 MG TABS tablet TAKE ONE TABLET BY MOUTH TWICE A DAY 08/12/21  Yes Fay Records, MD  Ascorbic Acid (VITAMIN C) 100 MG tablet Take 100 mg by mouth daily.   Yes [provider]  b complex vitamins tablet Take 1 tablet by mouth daily.   Yes [provider]  cholecalciferol (VITAMIN D3) 25 MCG (1000 UNIT) tablet Take 1,000 Units by mouth daily.   Yes [provider]  flecainide (TAMBOCOR) 50 MG tablet TAKE ONE AND ONE-HALF TABLETS BY  MOUTH TWICE A DAY 07/30/21  Yes Fay Records, MD  folic acid (FOLVITE) 1 MG tablet Take 1 mg by mouth daily. 11/12/19  Yes [provider]  furosemide (LASIX) 40 MG tablet Take 1 tablet (40 mg) by mouth every other day alternating with 1 and 1/2 tablets (60 mg) on the opposite days. 11/18/21  Yes Fay Records, MD  gabapentin (NEURONTIN) 300 MG capsule TAKE TWO CAPSULES BY MOUTH TWICE A DAY 12/04/21  Yes Patel, Donika K, DO  hydrALAZINE (APRESOLINE) 10 MG tablet Take 1 tablet (10 mg total) by mouth 2 (two) times daily. AS NEEDED FOR HIGH BP 12/07/21  Yes Fay Records, MD  hydrochlorothiazide (  HYDRODIURIL) 25 MG tablet TAKE ONE TABLET BY MOUTH ONE TIME DAILY 08/12/21  Yes Fay Records, MD  lisinopril (ZESTRIL) 40 MG tablet TAKE ONE TABLET BY MOUTH ONE TIME DAILY 08/12/21  Yes Fay Records, MD  metoprolol succinate (TOPROL-XL) 50 MG 24 hr tablet Take 0.5 tablets (25 mg total) by mouth daily. 11/17/21  Yes Fay Records, MD  potassium chloride SA (KLOR-CON M) 20 MEQ tablet TAKE ONE TABLET BY MOUTH ONE TIME DAILY, TAKE 40 MG (2 TABLETS) ON DAYS THAT YOU ALSO TAKE LASIX (FUROSEMIDE) 10/28/21  Yes Fay Records, MD  psyllium (METAMUCIL) 58.6 % packet Take 1 packet by mouth daily.   Yes [provider]  sodium chloride (OCEAN) 0.65 % SOLN nasal spray Place 1 spray into both nostrils as needed for congestion.   Yes [provider]  traMADol (ULTRAM) 50 MG tablet Take 50 mg by mouth every 6 (six) hours as needed. 12/07/21  Yes [provider]  acyclovir (ZOVIRAX) 400 MG tablet Take 1 tablet (400 mg total) by mouth 2 (two) times daily. 12/10/21   Orson Slick, MD  clobetasol cream (TEMOVATE) 0.05 % Apply topically. 08/05/21   [provider]  dexamethasone (DECADRON) 4 MG tablet Take 10 tablets (40 mg total) by mouth once a week. 12/10/21   Orson Slick, MD  fluticasone (FLONASE) 50 MCG/ACT nasal spray Place 1 spray into both nostrils daily as needed for allergies or  rhinitis.    [provider]  ondansetron (ZOFRAN) 8 MG tablet Take 1 tablet (8 mg total) by mouth every 8 (eight) hours as needed. 12/10/21   Orson Slick, MD  oxymetazoline (AFRIN) 0.05 % nasal spray Place 1 spray into both nostrils 2 (two) times daily as needed for congestion.    [provider]  prochlorperazine (COMPAZINE) 10 MG tablet Take 1 tablet (10 mg total) by mouth every 6 (six) hours as needed for nausea or vomiting. 12/10/21   Orson Slick, MD  triamcinolone cream (KENALOG) 0.1 % Apply 1 application topically daily as needed (itching). 06/05/19   [provider]    ROS: All other systems have been reviewed and were otherwise negative with the exception of those mentioned in the HPI and as above.  Physical Exam: General: Alert, no acute distress Cardiovascular: No pedal edema Respiratory: No cyanosis, no use of accessory musculature GI: No organomegaly, abdomen is soft and non-tender Skin: No lesions in the area of chief complaint Neurologic: Sensation intact distally Psychiatric: Patient is competent for consent with normal mood and affect Lymphatic: No axillary or cervical lymphadenopathy  MUSCULOSKELETAL:  On examination right shoulder active forward elevation to about 20. Deltoid is questionably firing. He has passive motion to 160 degrees. It is difficult to test his cuff testing overall.   Imaging: X-rays were reviewed and compared to previous and demonstrate no obvious change around the glenohumeral joint although he has a new lytic lesion in the mid-diaphysis of the humerus. There is also a lytic lesion in one of the ribs on the right side.   MRI results demonstrate a bursal sided cuff tear.  However, the more important and pertinent finding is that he has multiple lesions that are expansile and lytic within the humerus.  There is erosion of the posterior and inferior cortex of the acromion, as well as involvement of the humeral shaft  itself  Assessment: Right humerus pathlogical fracture  Plan: Plan for Procedure(s): INTRAMEDULLARY (IM) NAIL  HUMERAL BONE BIOPSY  The risks benefits and alternatives were discussed with the patient including but not limited to the risks of nonoperative treatment, versus surgical intervention including infection, bleeding, nerve injury,  blood clots, cardiopulmonary complications, morbidity, mortality, among others, and they were willing to proceed.   The patient acknowledged the explanation, agreed to proceed with the plan and consent was signed.   Operative Plan: IMN of right humerus for impending pathologic fracture with surgical biopsy Discharge Medications: Standard DVT Prophylaxis: Xarelto Physical Therapy: Delayed Special Discharge needs: Sling. [employer], PA-C  12/15/2021 10:37 AM

## 2021-12-16 NOTE — Discharge Instructions (Addendum)
Ophelia Charter MD, MPH Noemi Chapel, PA-C [place] [address] [phone] (tel)   [phone] (fax)   [place] may leave the operative dressing in place until your follow-up appointment. KEEP THE INCISIONS CLEAN AND DRY. There may be a small amount of fluid/bleeding leaking at the surgical site. This is normal after surgery.  If it fills with liquid or blood please call us immediately to change it for you. Use the provided ice machine or Ice packs as often as possible for the first 3-4 days, then as needed for pain relief.   Keep a layer of cloth or a shirt between your skin and the cooling unit to prevent frost bite as it can get very cold.  SHOWERING: - You may shower on Post-Op Day #2.  - The dressing is water resistant but do not scrub it as it may start to peel up.   - You may remove the sling for showering - Gently pat the area dry.  - Do not soak the shoulder in water.  - Do not go swimming in the pool or ocean until your incision has completely healed (about 4-6 weeks after surgery) - KEEP THE INCISIONS CLEAN AND DRY.  EXERCISES Wear the sling at all times  You may remove the sling for showering, but keep the arm across the chest or in a secondary sling.    Accidental/Purposeful External Rotation and shoulder flexion (reaching behind you) is to be avoided at all costs for the first month. It is ok to come out of your sling if your are sitting and have assistance for eating.   Do not lift anything heavier than 1 pound until we discuss it further in clinic.  It is normal for your fingers/hand to become more swollen after surgery and discolored from bruising.   This will resolve over the first few weeks usually after surgery. Please continue to ambulate and do not stay sitting or lying for too long.  Perform foot and wrist pumps to assist in circulation.   REGIONAL ANESTHESIA (NERVE BLOCKS) The  anesthesia team may have performed a nerve block for you this is a great tool used to minimize pain.   The block may start wearing off overnight (between 8-24 hours postop) When the block wears off, your pain may go from nearly zero to the pain you would have had postop without the block. This is an abrupt transition but nothing dangerous is happening.   This can be a challenging period but utilize your as needed pain medications to try and manage this period. We suggest you use the pain medication the first night prior to going to bed, to ease this transition.  You may take an extra dose of narcotic when this happens if needed   POST-OP MEDICATIONS- Multimodal approach to pain control In general your pain will be controlled with a combination of substances.  Prescriptions unless otherwise discussed are electronically sent to your pharmacy.  This is a carefully made plan we use to minimize narcotic use.     Acetaminophen - Non-narcotic pain medicine taken on a scheduled basis  Oxycodone - This is a strong narcotic, to be used only on an "as needed" basis for SEVERE pain. Robaxin - this is a muscle relaxer, take as needed for muscle spasms Zofran -  take as needed for nausea  You may resume your Eliquis 24 hours after surgery  FOLLOW-UP If you develop a Fever (>  101.5), Redness or Drainage from the surgical incision site, please call our office to arrange for an evaluation. Please call the office to schedule a follow-up appointment for a wound check, 7-10 days post-operatively.  IF YOU HAVE ANY QUESTIONS, PLEASE FEEL FREE TO CALL OUR OFFICE.  HELPFUL INFORMATION  Your arm will be in a sling following surgery. You will be in this sling for the next 4- 6 weeks.   You may be more comfortable sleeping in a semi-seated position the first few nights following surgery.  Keep a pillow propped under the elbow and forearm for comfort.  If you have a recliner type of chair it might be beneficial.  If  not that is fine too, but it would be helpful to sleep propped up with pillows behind your operated shoulder as well under your elbow and forearm.  This will reduce pulling on the suture lines.  When dressing, put your operative arm in the sleeve first.  When getting undressed, take your operative arm out last.  Loose fitting, button-down shirts are recommended.  In most states it is against the law to drive while your arm is in a sling. And certainly against the law to drive while taking narcotics.  You may return to work/school in the next couple of days when you feel up to it. Desk work and typing in the sling is fine.  We suggest you use the pain medication the first night prior to going to bed, in order to ease any pain when the anesthesia wears off. You should avoid taking pain medications on an empty stomach as it will make you nauseous.  You should wean off your narcotic medicines as soon as you are able.     Most patients will be off or using minimal narcotics before their first postop appointment.   Do not drink alcoholic beverages or take illicit drugs when taking pain medications.  Pain medication may make you constipated.  Below are a few solutions to try in this order: Decrease the amount of pain medication if you aren't having pain. Drink lots of decaffeinated fluids. Drink prune juice and/or each dried prunes  If the first 3 don't work start with additional solutions Take Colace - an over-the-counter stool softener Take Senokot - an over-the-counter laxative Take Miralax - a stronger over-the-counter laxative     For more information including helpful videos and documents visit our website:   [email or web address]    No Tylenol until after 4pm today if needed  Post Anesthesia Home Care Instructions  Activity: Get plenty of rest for the remainder of the day. A responsible individual must stay with you for 24 hours following the  procedure.  For the next 24 hours, DO NOT: -Drive a car -Paediatric nurse -Drink alcoholic beverages -Take any medication unless instructed by your physician -Make any legal decisions or sign important papers.  Meals: Start with liquid foods such as gelatin or soup. Progress to regular foods as tolerated. Avoid greasy, spicy, heavy foods. If nausea and/or vomiting occur, drink only clear liquids until the nausea and/or vomiting subsides. Call your physician if vomiting continues.  Special Instructions/Symptoms: Your throat may feel dry or sore from the anesthesia or the breathing tube placed in your throat during surgery. If this causes discomfort, gargle with warm salt water. The discomfort should disappear within 24 hours.  If you had a scopolamine patch placed behind your ear for the management of post- operative nausea and/or vomiting:  1. The medication in the  patch is effective for 72 hours, after which it should be removed.  Wrap patch in a tissue and discard in the trash. Wash hands thoroughly with soap and water. 2. You may remove the patch earlier than 72 hours if you experience unpleasant side effects which may include dry mouth, dizziness or visual disturbances. 3. Avoid touching the patch. Wash your hands with soap and water after contact with the patch.     Regional Anesthesia Blocks  1. Numbness or the inability to move the "blocked" extremity may last from 3-48 hours after placement. The length of time depends on the medication injected and your individual response to the medication. If the numbness is not going away after 48 hours, call your surgeon.  2. The extremity that is blocked will need to be protected until the numbness is gone and the  Strength has returned. Because you cannot feel it, you will need to take extra care to avoid injury. Because it may be weak, you may have difficulty moving it or using it. You may not know what position it is in without looking at it  while the block is in effect.  3. For blocks in the legs and feet, returning to weight bearing and walking needs to be done carefully. You will need to wait until the numbness is entirely gone and the strength has returned. You should be able to move your leg and foot normally before you try and bear weight or walk. You will need someone to be with you when you first try to ensure you do not fall and possibly risk injury.  4. Bruising and tenderness at the needle site are common side effects and will resolve in a few days.  5. Persistent numbness or new problems with movement should be communicated to the surgeon or the [place] [phone] [place] [phone]).

## 2021-12-17 ENCOUNTER — Other Ambulatory Visit: Payer: Self-pay

## 2021-12-17 ENCOUNTER — Ambulatory Visit (HOSPITAL_COMMUNITY): Payer: Medicare Other

## 2021-12-17 ENCOUNTER — Encounter (HOSPITAL_BASED_OUTPATIENT_CLINIC_OR_DEPARTMENT_OTHER)
Admission: RE | Disposition: A | Discharge status: Home / Self Care | Payer: Self-pay | Source: Home / Self Care | Attending: Orthopaedic Surgery

## 2021-12-17 ENCOUNTER — Ambulatory Visit (HOSPITAL_BASED_OUTPATIENT_CLINIC_OR_DEPARTMENT_OTHER): Payer: Medicare Other | Admitting: Anesthesiology

## 2021-12-17 ENCOUNTER — Encounter (HOSPITAL_BASED_OUTPATIENT_CLINIC_OR_DEPARTMENT_OTHER): Payer: Self-pay | Admitting: Orthopaedic Surgery

## 2021-12-17 ENCOUNTER — Ambulatory Visit (HOSPITAL_BASED_OUTPATIENT_CLINIC_OR_DEPARTMENT_OTHER)
Admission: RE | Admit: 2021-12-17 | Discharge: 2021-12-17 | Disposition: A | Discharge status: Home / Self Care | Payer: Medicare Other | Attending: Orthopaedic Surgery | Admitting: Orthopaedic Surgery

## 2021-12-17 DIAGNOSIS — C9000 Multiple myeloma not having achieved remission: Secondary | ICD-10-CM | POA: Diagnosis not present

## 2021-12-17 DIAGNOSIS — C7951 Secondary malignant neoplasm of bone: Secondary | ICD-10-CM | POA: Diagnosis not present

## 2021-12-17 DIAGNOSIS — K219 Gastro-esophageal reflux disease without esophagitis: Secondary | ICD-10-CM | POA: Insufficient documentation

## 2021-12-17 DIAGNOSIS — Z86718 Personal history of other venous thrombosis and embolism: Secondary | ICD-10-CM | POA: Insufficient documentation

## 2021-12-17 DIAGNOSIS — I10 Essential (primary) hypertension: Secondary | ICD-10-CM | POA: Diagnosis not present

## 2021-12-17 DIAGNOSIS — Z9889 Other specified postprocedural states: Secondary | ICD-10-CM | POA: Diagnosis not present

## 2021-12-17 DIAGNOSIS — I480 Paroxysmal atrial fibrillation: Secondary | ICD-10-CM | POA: Diagnosis not present

## 2021-12-17 DIAGNOSIS — Z7901 Long term (current) use of anticoagulants: Secondary | ICD-10-CM | POA: Diagnosis not present

## 2021-12-17 DIAGNOSIS — M898X2 Other specified disorders of bone, upper arm: Secondary | ICD-10-CM | POA: Diagnosis not present

## 2021-12-17 DIAGNOSIS — M84421A Pathological fracture, right humerus, initial encounter for fracture: Secondary | ICD-10-CM | POA: Diagnosis not present

## 2021-12-17 DIAGNOSIS — Z8719 Personal history of other diseases of the digestive system: Secondary | ICD-10-CM | POA: Insufficient documentation

## 2021-12-17 DIAGNOSIS — M84422A Pathological fracture, left humerus, initial encounter for fracture: Secondary | ICD-10-CM | POA: Diagnosis not present

## 2021-12-17 DIAGNOSIS — G8918 Other acute postprocedural pain: Secondary | ICD-10-CM | POA: Diagnosis not present

## 2021-12-17 DIAGNOSIS — Z8579 Personal history of other malignant neoplasms of lymphoid, hematopoietic and related tissues: Secondary | ICD-10-CM | POA: Diagnosis not present

## 2021-12-17 DIAGNOSIS — I493 Ventricular premature depolarization: Secondary | ICD-10-CM | POA: Insufficient documentation

## 2021-12-17 DIAGNOSIS — G4733 Obstructive sleep apnea (adult) (pediatric): Secondary | ICD-10-CM | POA: Diagnosis not present

## 2021-12-17 DIAGNOSIS — M89321 Hypertrophy of bone, right humerus: Secondary | ICD-10-CM | POA: Diagnosis not present

## 2021-12-17 DIAGNOSIS — R7303 Prediabetes: Secondary | ICD-10-CM | POA: Diagnosis not present

## 2021-12-17 DIAGNOSIS — Z01818 Encounter for other preprocedural examination: Secondary | ICD-10-CM

## 2021-12-17 HISTORY — PX: HUMERUS IM NAIL: SHX1769

## 2021-12-17 HISTORY — DX: Multiple myeloma not having achieved remission: C90.00

## 2021-12-17 HISTORY — PX: BONE BIOPSY: SHX375

## 2021-12-17 SURGERY — INSERTION, INTRAMEDULLARY ROD, HUMERUS
Anesthesia: General | Site: Arm Upper | Laterality: Right

## 2021-12-17 MED ORDER — LIDOCAINE 2% (20 MG/ML) 5 ML SYRINGE
INTRAMUSCULAR | Status: AC
  Filled 2021-12-17: qty 5

## 2021-12-17 MED ORDER — SUGAMMADEX SODIUM 200 MG/2ML IV SOLN
INTRAVENOUS | Status: DC | PRN
  Administered 2021-12-17: 250 mg via INTRAVENOUS

## 2021-12-17 MED ORDER — ONDANSETRON HCL 4 MG PO TABS: 4.0000 mg | ORAL_TABLET | Freq: Three times a day (TID) | ORAL | 0 refills | Status: AC | PRN

## 2021-12-17 MED ORDER — CEFAZOLIN SODIUM-DEXTROSE 2-4 GM/100ML-% IV SOLN
2.0000 g | INTRAVENOUS | Status: AC
  Administered 2021-12-17: 2 g via INTRAVENOUS

## 2021-12-17 MED ORDER — FENTANYL CITRATE (PF) 100 MCG/2ML IJ SOLN
INTRAMUSCULAR | Status: DC | PRN
  Administered 2021-12-17: 50 ug via INTRAVENOUS

## 2021-12-17 MED ORDER — PHENYLEPHRINE HCL (PRESSORS) 10 MG/ML IV SOLN
INTRAVENOUS | Status: AC
  Filled 2021-12-17: qty 1

## 2021-12-17 MED ORDER — OXYCODONE HCL 5 MG/5ML PO SOLN: 5.0000 mg | Freq: Once | ORAL | Status: DC | PRN

## 2021-12-17 MED ORDER — PROPOFOL 10 MG/ML IV BOLUS
INTRAVENOUS | Status: DC | PRN
  Administered 2021-12-17: 20 mg via INTRAVENOUS
  Administered 2021-12-17: 150 mg via INTRAVENOUS

## 2021-12-17 MED ORDER — BUPIVACAINE LIPOSOME 1.3 % IJ SUSP
INTRAMUSCULAR | Status: DC | PRN
  Administered 2021-12-17: 10 mL via PERINEURAL

## 2021-12-17 MED ORDER — FENTANYL CITRATE (PF) 100 MCG/2ML IJ SOLN
INTRAMUSCULAR | Status: AC
  Filled 2021-12-17: qty 2

## 2021-12-17 MED ORDER — MIDAZOLAM HCL 2 MG/2ML IJ SOLN
1.0000 mg | Freq: Once | INTRAMUSCULAR | Status: AC
  Administered 2021-12-17: 1 mg via INTRAVENOUS

## 2021-12-17 MED ORDER — CEFAZOLIN SODIUM-DEXTROSE 2-4 GM/100ML-% IV SOLN
INTRAVENOUS | Status: AC
  Filled 2021-12-17: qty 100

## 2021-12-17 MED ORDER — ACETAMINOPHEN 500 MG PO TABS
ORAL_TABLET | ORAL | Status: AC
  Filled 2021-12-17: qty 2

## 2021-12-17 MED ORDER — ONDANSETRON HCL 4 MG/2ML IJ SOLN
INTRAMUSCULAR | Status: AC
  Filled 2021-12-17: qty 2

## 2021-12-17 MED ORDER — METHOCARBAMOL 500 MG PO TABS: 500.0000 mg | ORAL_TABLET | Freq: Three times a day (TID) | ORAL | 0 refills | Status: DC | PRN

## 2021-12-17 MED ORDER — ROCURONIUM BROMIDE 10 MG/ML (PF) SYRINGE
PREFILLED_SYRINGE | INTRAVENOUS | Status: AC
  Filled 2021-12-17: qty 10

## 2021-12-17 MED ORDER — FENTANYL CITRATE (PF) 100 MCG/2ML IJ SOLN
100.0000 ug | Freq: Once | INTRAMUSCULAR | Status: AC
  Administered 2021-12-17: 100 ug via INTRAVENOUS

## 2021-12-17 MED ORDER — FENTANYL CITRATE (PF) 100 MCG/2ML IJ SOLN
25.0000 ug | INTRAMUSCULAR | Status: DC | PRN
  Administered 2021-12-17 (×2): 25 ug via INTRAVENOUS

## 2021-12-17 MED ORDER — PHENYLEPHRINE HCL-NACL 20-0.9 MG/250ML-% IV SOLN
INTRAVENOUS | Status: DC | PRN
  Administered 2021-12-17: 25 ug/min via INTRAVENOUS

## 2021-12-17 MED ORDER — OXYCODONE HCL 5 MG PO TABS: 5.0000 mg | ORAL_TABLET | Freq: Once | ORAL | Status: DC | PRN

## 2021-12-17 MED ORDER — LIDOCAINE 2% (20 MG/ML) 5 ML SYRINGE
INTRAMUSCULAR | Status: DC | PRN
  Administered 2021-12-17: 100 mg via INTRAVENOUS

## 2021-12-17 MED ORDER — AMISULPRIDE (ANTIEMETIC) 5 MG/2ML IV SOLN: 10.0000 mg | Freq: Once | INTRAVENOUS | Status: DC | PRN

## 2021-12-17 MED ORDER — ROCURONIUM BROMIDE 100 MG/10ML IV SOLN
INTRAVENOUS | Status: DC | PRN
  Administered 2021-12-17: 60 mg via INTRAVENOUS

## 2021-12-17 MED ORDER — ACETAMINOPHEN 500 MG PO TABS: 1000.0000 mg | ORAL_TABLET | Freq: Three times a day (TID) | ORAL | 0 refills | Status: AC

## 2021-12-17 MED ORDER — 0.9 % SODIUM CHLORIDE (POUR BTL) OPTIME
TOPICAL | Status: DC | PRN
  Administered 2021-12-17: 1000 mL

## 2021-12-17 MED ORDER — LACTATED RINGERS IV SOLN: INTRAVENOUS | Status: DC

## 2021-12-17 MED ORDER — ACETAMINOPHEN 500 MG PO TABS
1000.0000 mg | ORAL_TABLET | Freq: Once | ORAL | Status: AC
  Administered 2021-12-17: 1000 mg via ORAL

## 2021-12-17 MED ORDER — DEXAMETHASONE SODIUM PHOSPHATE 10 MG/ML IJ SOLN
INTRAMUSCULAR | Status: AC
  Filled 2021-12-17: qty 1

## 2021-12-17 MED ORDER — ONDANSETRON HCL 4 MG/2ML IJ SOLN
INTRAMUSCULAR | Status: DC | PRN
  Administered 2021-12-17: 4 mg via INTRAVENOUS

## 2021-12-17 MED ORDER — BUPIVACAINE-EPINEPHRINE (PF) 0.5% -1:200000 IJ SOLN
INTRAMUSCULAR | Status: DC | PRN
  Administered 2021-12-17: 15 mL via PERINEURAL

## 2021-12-17 MED ORDER — PROPOFOL 10 MG/ML IV BOLUS
INTRAVENOUS | Status: AC
  Filled 2021-12-17: qty 20

## 2021-12-17 MED ORDER — OXYCODONE HCL 5 MG PO TABS: ORAL_TABLET | ORAL | 0 refills | Status: AC

## 2021-12-17 MED ORDER — EPHEDRINE 5 MG/ML INJ
INTRAVENOUS | Status: AC
Start: 2021-12-17 — End: ?
  Filled 2021-12-17: qty 5

## 2021-12-17 MED ORDER — EPHEDRINE SULFATE (PRESSORS) 50 MG/ML IJ SOLN
INTRAMUSCULAR | Status: DC | PRN
  Administered 2021-12-17: 10 mg via INTRAVENOUS
  Administered 2021-12-17 (×2): 5 mg via INTRAVENOUS

## 2021-12-17 MED ORDER — MIDAZOLAM HCL 2 MG/2ML IJ SOLN
INTRAMUSCULAR | Status: AC
  Filled 2021-12-17: qty 2

## 2021-12-17 MED ORDER — DEXAMETHASONE SODIUM PHOSPHATE 10 MG/ML IJ SOLN
INTRAMUSCULAR | Status: DC | PRN
  Administered 2021-12-17: 5 mg via INTRAVENOUS

## 2021-12-17 SURGICAL SUPPLY — 63 items
AID PSTN UNV HD RSTRNT DISP (MISCELLANEOUS) ×2
APL PRP STRL LF DISP 70% ISPRP (MISCELLANEOUS) ×2
BIT DRILL 10 HOLLOW (BIT) IMPLANT
BIT DRILL 3.8X270 (BIT) IMPLANT
BIT DRILL SHORT 3.2MM (DRILL) IMPLANT
BLADE HEX COATED 2.75 (ELECTRODE) IMPLANT
BLADE SURG 10 STRL SS (BLADE) ×2 IMPLANT
BLADE SURG 15 STRL LF DISP TIS (BLADE) ×2 IMPLANT
BLADE SURG 15 STRL SS (BLADE) ×2
BNDG CMPR 5X4 CHSV STRCH STRL (GAUZE/BANDAGES/DRESSINGS)
BNDG COHESIVE 4X5 TAN STRL LF (GAUZE/BANDAGES/DRESSINGS) IMPLANT
CHLORAPREP W/TINT 26 (MISCELLANEOUS) ×2 IMPLANT
CLSR STERI-STRIP ANTIMIC 1/2X4 (GAUZE/BANDAGES/DRESSINGS) ×2 IMPLANT
COOLER ICEMAN CLASSIC (MISCELLANEOUS) ×2 IMPLANT
DRAPE C-ARM 42X72 X-RAY (DRAPES) ×2 IMPLANT
DRAPE IMP U-DRAPE 54X76 (DRAPES) ×2 IMPLANT
DRAPE INCISE IOBAN 66X45 STRL (DRAPES) ×2 IMPLANT
DRAPE U-SHAPE 76X120 STRL (DRAPES) ×4 IMPLANT
DRILL SHORT 3.2MM (DRILL) ×2
DRSG AQUACEL AG ADV 3.5X 6 (GAUZE/BANDAGES/DRESSINGS) ×2 IMPLANT
ELECT REM PT RETURN 9FT ADLT (ELECTROSURGICAL) ×2
ELECTRODE REM PT RTRN 9FT ADLT (ELECTROSURGICAL) ×2 IMPLANT
GLOVE BIO SURGEON STRL SZ 6.5 (GLOVE) ×2 IMPLANT
GLOVE BIO SURGEON STRL SZ8 (GLOVE) IMPLANT
GLOVE BIOGEL PI IND STRL 6.5 (GLOVE) ×2 IMPLANT
GLOVE BIOGEL PI IND STRL 8 (GLOVE) ×2 IMPLANT
GLOVE BIOGEL PI INDICATOR 6.5 (GLOVE) ×2
GLOVE BIOGEL PI INDICATOR 8 (GLOVE) ×2
GLOVE ECLIPSE 8.0 STRL XLNG CF (GLOVE) ×2 IMPLANT
GOWN STRL REUS W/ TWL LRG LVL3 (GOWN DISPOSABLE) ×2 IMPLANT
GOWN STRL REUS W/TWL LRG LVL3 (GOWN DISPOSABLE) ×2
GOWN STRL REUS W/TWL XL LVL3 (GOWN DISPOSABLE) ×2 IMPLANT
GUIDEROD SS 2.5MMX280MM (ROD) IMPLANT
KIT SHOULDER STAB MARCO (KITS) ×2 IMPLANT
NAIL HUMERAL CANN 8.5 (Nail) IMPLANT
PACK ARTHROSCOPY DSU (CUSTOM PROCEDURE TRAY) ×2 IMPLANT
PACK BASIN DAY SURGERY FS (CUSTOM PROCEDURE TRAY) ×2 IMPLANT
PAD COLD SHLDR WRAP-ON (PAD) ×2 IMPLANT
PENCIL SMOKE EVACUATOR (MISCELLANEOUS) ×2 IMPLANT
RESTRAINT HEAD UNIVERSAL NS (MISCELLANEOUS) ×2 IMPLANT
ROD REAMING 2.5 (INSTRUMENTS) IMPLANT
SCREW LOCK 4.5X40 (Screw) ×2 IMPLANT
SCREW LOCK 4.5X44 (Screw) IMPLANT
SCREW LOCK FT 40X4.5X3.9XSLF (Screw) IMPLANT
SCREW LOCKING 4.0 28MM (Screw) IMPLANT
SHEET MEDIUM DRAPE 40X70 STRL (DRAPES) ×2 IMPLANT
SLEEVE SCD COMPRESS KNEE MED (STOCKING) ×2 IMPLANT
SLING ARM FOAM STRAP LRG (SOFTGOODS) IMPLANT
SLING ARM FOAM STRAP XLG (SOFTGOODS) IMPLANT
SPIKE FLUID TRANSFER (MISCELLANEOUS) IMPLANT
SPONGE T-LAP 18X18 ~~LOC~~+RFID (SPONGE) ×2 IMPLANT
SPONGE T-LAP 4X18 ~~LOC~~+RFID (SPONGE) IMPLANT
SUT MNCRL AB 4-0 PS2 18 (SUTURE) IMPLANT
SUT VIC AB 0 CT1 18XCR BRD 8 (SUTURE) ×2 IMPLANT
SUT VIC AB 0 CT1 27 (SUTURE)
SUT VIC AB 0 CT1 27XCR 8 STRN (SUTURE) IMPLANT
SUT VIC AB 0 CT1 8-18 (SUTURE) ×2
SUT VIC AB 3-0 SH 27 (SUTURE) ×2
SUT VIC AB 3-0 SH 27X BRD (SUTURE) ×2 IMPLANT
SUT VICRYL 0 SH 27 (SUTURE) IMPLANT
SYR BULB EAR ULCER 3OZ GRN STR (SYRINGE) ×2 IMPLANT
TOWEL GREEN STERILE FF (TOWEL DISPOSABLE) ×2 IMPLANT
TUBE SUCTION HIGH CAP CLEAR NV (SUCTIONS) ×2 IMPLANT

## 2021-12-17 NOTE — Op Note (Signed)
Orthopaedic Surgery Operative Note (CSN: [id number])  Travis [name]  1950-11-27 Date of Surgery: 12/17/2021   Diagnoses:  left humerus pathlogical fracture  Procedure: Left humerus intramedullary nailing Left humerus open biopsy   Operative Finding Successful completion of the planned procedure.  We obtained a core specimen from the humeral head about 14 mm in length and about 10 mm in diameter.  We additionally supplemented a second specimen with multiple pituitary grabs from the inferior calcar region which was involved as well as from the diaphyseal lesion which had fractured through before the case began.  It was clear that there was rotational instability at this area and this was likely causing a significant amount of the patient's pain.  Unfortunately [name]'s lesions involved multiple areas of his body.  We did have a frank conversation with Travis [name] as well as his wife about the goals of our surgery.  While fracture treatment and instrumentation of the entirety of the bone were our priority obtaining samples for additional diagnosis was also pertinent.  We did offer him CT-guided biopsy prior to surgery to try and confirm diagnosis however considering his lab results as well as his disease involving multiple parts of his body he and I both felt that instrumenting his humerus would be necessary no matter what and that further time spent only increase the morbidity for the patient.  Of note the patient had a partial-thickness rotator cuff tear but this was not amenable for takedown and repair as it was bursal sided and only about 10 to 15% of the tendon.  One of his most proximal interlocking screws engaged the hard cortical bone of the tuberosity however it was not prominent upon palpation.  Post-operative plan: The patient will be nonweightbearing in a sling except for waist level activities of daily living.  The patient will be discharged home.  DVT prophylaxis not indicated as patient  already on full dose anticoagulant.   Pain control with PRN pain medication preferring oral medicines.  Follow up plan will be scheduled in approximately 7 days for incision check and XR.  Post-Op Diagnosis: Same Surgeons:Primary: Hiram Gash, MD Assistants:Caroline McBane PA-C Location: [facility] OR ROOM 6 Anesthesia: General with regional anesthesia Antibiotics: Ancef 2 g, we were unable to use local vancomycin powder as patient has an allergy Tourniquet time:  Estimated Blood Loss: 510 Complications: None Specimens: 2 specimens both for permanent pathology. Implants: Implant Name Type Inv. Item Serial No. Manufacturer Lot No. LRB No. Used Action  SCREW LOCK 4.5X40 - [id number] Screw SCREW LOCK 4.5X40  [employer]  Right 1 Implanted  SCREW LOCK 4.5X44 - [id number] Screw SCREW LOCK 4.5X44  [employer]  Right 1 Implanted  8.5MM TI MULTILOC HUMERAL NAIL RIGHT SIZE [place] [id number] Right 1 Implanted  SCREW LOCKING 4.0 28MM - [id number] Screw SCREW LOCKING 4.0 28MM  [employer]  Right 1 Implanted    Indications for Surgery:   Travis [name] is a 71 y.o. male with history of multiple myeloma with multiple metastatic lesions.  Patient had a pathologic/impending pathologic fracture of his humeral diaphysis which unfortunately have been completed by the time of the surgery.  Benefits and risks of operative and nonoperative management were discussed prior to surgery with patient/guardian(s) and informed consent form was completed.  Specific risks including infection, need for additional surgery, nonunion, hardware malfunction, continued pain and cuff failure amongst others   Procedure:   The patient was identified properly. Informed consent was obtained  and the surgical site was marked. The patient was taken up to suite where general anesthesia was induced.  The patient was positioned beachchair on [employer] table.  The right arm was prepped and draped in the usual  sterile fashion.  Timeout was performed before the beginning of the case.  We began with a mini open approach to the anterolateral humerus using a 3 cm incision.  This was centered starting just proximal to the anterolateral corner of the acromion.  We went to skin sharply achieving hemostasis we progressed.  We identified the deltoid fascia and opened it in line with our incision in its fibers.  We able to split the fibers bluntly not detaching any of the humerus.  We did a bursectomy and identified that the cuff had a partial-thickness rotator cuff tear involving the bursal side.  This did not require repair however we took note of it and debrided it.  We were able to split the cuff in line with its fibers and identified the biceps tendon which was intact.  We retracted this out of the way.  At this point we used fluoroscopic imaging to place a starting pin at the most proximal aspect of the humerus on the articular margin central to the cuff insertion.  We placed a guidewire at this point and then overreamed with the entry reamer from the [employer] set.    We took the coring sample from the humeral head and sent for specimen.  We then used our opening in the humeral head to obtain samples from the identified areas in the calcar.  Additionally we used a pituitary rondure to take samples from the diaphyseal lesion using fluoroscopic guidance.  These were all sent for specimen.  We did confirm with live fluoroscopy that the fracture was actually completed through the diaphyseal lesion.  That said we were able to reduce it anatomically.  We used orthogonal images to reduce the fracture indirectly until it was adequately reduced.    That point were able to place a guidewire down the fracture and were able to align the fracture fragments.  We then selected a 270 mm nail and were able to place it without issue obtaining orthogonal images we progressed.  We used the outrigger to place 2 proximal screws in  typical fashion using percutaneously placed incisions and spreading through the deep surfaces down to bone.  We had good fixation of these 3 screws.  We placed 1 distal interlock in a similar fashion.  We did use a small mini open incision to place our distal screw to avoid neurovascular damage placing blunt retractors and palpating directly on bone before placing her interlock screw.  [place] films demonstrated no perforation of the joint surface and good fixation of the proximal distal aspects of the nail.  We irrigated copiously before closing the side to side rotator cuff split with #2 FiberWire.  We again irrigated before closing the incision in a multilayer fashion with nonabsorbable sutures.  Sterile dressing was placed and the patient was placed in a sling.  Patient awoken taken to PACU in stable condition.   Noemi Chapel, PA-C, present and scrubbed throughout the case, critical for completion in a timely fashion, and for retraction, instrumentation, closure.

## 2021-12-17 NOTE — Progress Notes (Signed)
Assisted Dr. Daiva Huge with right, supraclavicular, ultrasound guided block. Side rails up, monitors on throughout procedure. See vital signs in flow sheet. Tolerated Procedure well.

## 2021-12-17 NOTE — Anesthesia Postprocedure Evaluation (Signed)
Anesthesia Post Note  Patient: Travis [name]  Procedure(s) Performed: INTRAMEDULLARY (IM) NAIL HUMERAL (Right: Arm Upper) BONE BIOPSY (Right)     Patient location during evaluation: PACU Anesthesia Type: General Level of consciousness: awake and alert Pain management: pain level controlled Vital Signs Assessment: post-procedure vital signs reviewed and stable Respiratory status: spontaneous breathing, nonlabored ventilation and respiratory function stable Cardiovascular status: blood pressure returned to baseline Postop Assessment: no apparent nausea or vomiting Anesthetic complications: no   No notable events documented.  Last Vitals:  Vitals:   12/17/21 1345 12/17/21 1400  BP: 131/71 134/68  Pulse: 61 64  Resp: 14 16  Temp:  36.6 C  SpO2: 96% 92%    Last Pain:  Vitals:   12/17/21 1400  PainSc: Reform

## 2021-12-17 NOTE — Interval H&P Note (Signed)
We again discussed we don't have a definitive bone diagnosis but all information points towards multiple myeloma. We will obtain samples but family did not want to wait for a bone biopsy. We will proceed.

## 2021-12-17 NOTE — Anesthesia Procedure Notes (Signed)
Procedure Name: Intubation Date/Time: 12/17/2021 11:14 AM  Performed by: Lavonia Dana, CRNAPre-anesthesia Checklist: Patient identified, Emergency Drugs available, Suction available and Patient being monitored Patient Re-evaluated:Patient Re-evaluated prior to induction Oxygen Delivery Method: Circle system utilized Preoxygenation: Pre-oxygenation with 100% oxygen Induction Type: IV induction Ventilation: Mask ventilation without difficulty and Oral airway inserted - appropriate to patient size Laryngoscope Size: Glidescope and 3 Grade View: Grade I Tube type: Oral Tube size: 7.5 mm Number of attempts: 1 Airway Equipment and Method: Stylet, Oral airway and Bite block Placement Confirmation: ETT inserted through vocal cords under direct vision, positive ETCO2 and breath sounds checked- equal and bilateral Tube secured with: Tape Dental Injury: Teeth and Oropharynx as per pre-operative assessment  Comments: DL x1 with Mac 4; unable to obtain view r/t patient's limited neck mobility. Easy, atraumatic intubation with Glidescope 3

## 2021-12-17 NOTE — Transfer of Care (Signed)
Immediate Anesthesia Transfer of Care Note  Patient: Travis [name]  Procedure(s) Performed: INTRAMEDULLARY (IM) NAIL HUMERAL (Right: Arm Upper) BONE BIOPSY (Right)  Patient Location: PACU  Anesthesia Type:GA combined with regional for post-op pain  Level of Consciousness: awake, alert  and oriented  Airway & Oxygen Therapy: Patient Spontanous Breathing and Patient connected to face mask oxygen  Post-op Assessment: Report given to RN and Post -op Vital signs reviewed and stable  Post vital signs: Reviewed and stable  Last Vitals:  Vitals Value Taken Time  BP 141/72 12/17/21 1254  Temp    Pulse 60 12/17/21 1256  Resp 17 12/17/21 1256  SpO2 97 % 12/17/21 1256  Vitals shown include unvalidated device data.  Last Pain: There were no vitals filed for this visit.       Complications: No notable events documented.

## 2021-12-17 NOTE — Anesthesia Preprocedure Evaluation (Addendum)
Anesthesia Evaluation  Patient identified by MRN, date of birth, ID band Patient awake    Reviewed: Allergy & Precautions, NPO status , Patient's Chart, lab work & pertinent test results, reviewed documented beta blocker date and time   History of Anesthesia Complications Negative for: history of anesthetic complications  Airway Mallampati: II  TM Distance: >3 FB Neck ROM: Full    Dental no notable dental hx.    Pulmonary sleep apnea and Continuous Positive Airway Pressure Ventilation ,    Pulmonary exam normal        Cardiovascular hypertension, Pt. on medications and Pt. on home beta blockers + DVT (LLE)  Normal cardiovascular exam+ dysrhythmias (on Eliquis) Atrial Fibrillation      Neuro/Psych negative neurological ROS  negative psych ROS   GI/Hepatic Neg liver ROS, hiatal hernia, GERD  Controlled,  Endo/Other  negative endocrine ROS  Renal/GU negative Renal ROS  negative genitourinary   Musculoskeletal  (+) Arthritis ,   Abdominal   Peds  Hematology multiple myeloma   Anesthesia Other Findings Day of surgery medications reviewed with patient.  Reproductive/Obstetrics negative OB ROS                            Anesthesia Physical Anesthesia Plan  ASA: 2  Anesthesia Plan: General   Post-op Pain Management: Tylenol PO (pre-op)* and Regional block*   Induction: Intravenous  PONV Risk Score and Plan: 2 and Treatment may vary due to age or medical condition, Ondansetron, Dexamethasone and Midazolam  Airway Management Planned: Oral ETT  Additional Equipment: None  Intra-op Plan:   Post-operative Plan: Extubation in OR  Informed Consent: I have reviewed the patients History and Physical, chart, labs and discussed the procedure including the risks, benefits and alternatives for the proposed anesthesia with the patient or authorized representative who has indicated his/her  understanding and acceptance.     Dental advisory given  Plan Discussed with: CRNA  Anesthesia Plan Comments:        Anesthesia Quick Evaluation

## 2021-12-17 NOTE — Anesthesia Procedure Notes (Signed)
Anesthesia Regional Block: Interscalene brachial plexus block   Pre-Anesthetic Checklist: , timeout performed,  Correct Patient, Correct Site, Correct Laterality,  Correct Procedure, Correct Position, site marked,  Risks and benefits discussed,  Pre-op evaluation,  At surgeon's request and post-op pain management  Laterality: Right  Prep: Maximum Sterile Barrier Precautions used, chloraprep       Needles:  Injection technique: Single-shot  Needle Type: Echogenic Stimulator Needle     Needle Length: 4cm  Needle Gauge: 22     Additional Needles:   Procedures:,,,, ultrasound used (permanent image in chart),,    Narrative:  Start time: 12/17/2021 10:37 AM End time: 12/17/2021 10:40 AM Injection made incrementally with aspirations every 5 mL.  Performed by: Personally  Anesthesiologist: Brennan Bailey, MD  Additional Notes: Risks, benefits, and alternative discussed. Patient gave consent for procedure. Patient prepped and draped in sterile fashion. Sedation administered, patient remains easily responsive to voice. Relevant anatomy identified with ultrasound guidance. Local anesthetic given in 5cc increments with no signs or symptoms of intravascular injection. No pain or paraesthesias with injection. Patient monitored throughout procedure with signs of LAST or immediate complications. Tolerated well. Ultrasound image placed in chart.  Tawny Asal, MD

## 2021-12-18 ENCOUNTER — Encounter (HOSPITAL_BASED_OUTPATIENT_CLINIC_OR_DEPARTMENT_OTHER): Payer: Self-pay | Admitting: Orthopaedic Surgery

## 2021-12-21 ENCOUNTER — Other Ambulatory Visit: Payer: Self-pay | Admitting: Physician Assistant

## 2021-12-21 NOTE — Progress Notes (Signed)
Pharmacist Chemotherapy Monitoring - Initial Assessment    Anticipated start date: 12/28/21   The following has been reviewed per standard work regarding the patient's treatment regimen: The patient's diagnosis, treatment plan and drug doses, and organ/hematologic function Lab orders and baseline tests specific to treatment regimen  The treatment plan start date, drug sequencing, and pre-medications Prior authorization status  Patient's documented medication list, including drug-drug interaction screen and prescriptions for anti-emetics and supportive care specific to the treatment regimen The drug concentrations, fluid compatibility, administration routes, and timing of the medications to be used The patient's access for treatment and lifetime cumulative dose history, if applicable  The patient's medication allergies and previous infusion related reactions, if applicable   Changes made to treatment plan:  treatment plan date  Follow up needed:  N/A   Judge Stall, [place], 12/21/2021  11:07 AM

## 2021-12-22 LAB — SURGICAL PATHOLOGY

## 2021-12-23 ENCOUNTER — Ambulatory Visit: Payer: Medicare Other | Admitting: Internal Medicine

## 2021-12-23 ENCOUNTER — Telehealth: Payer: Self-pay | Admitting: *Deleted

## 2021-12-23 DIAGNOSIS — M84421D Pathological fracture, right humerus, subsequent encounter for fracture with routine healing: Secondary | ICD-10-CM | POA: Diagnosis not present

## 2021-12-23 NOTE — Telephone Encounter (Signed)
Received call from pt. He states he has not turned in his 24 hr urine as he did not get a container when he was here last. Advised that he can just get the container tomorrow when he comes in for labs, MD visit and his first chemo treatment. He voiced understanding. He also states he is having rib pain and wondered if it was he Multiple Myeloma. Advised that according to his bone survey-he does not have any lytic lesions in his ribs. They are only in his right arm and skull. Pt had surgery on his right arm last week. Suggested it might be related to that-positional during surgery. Advised to try heating pad for the discomfort or ice if the heat did not help. Pt is on oxycodone s/p surgery and is already taking 1072m Tylenol every 6 hours.  Advised to not increase his tylenol at this time as he is at the max amount he can take. Advised we will evaluate ribcage pain tomorrow at his scheduled appt. Pt voiced understanding.

## 2021-12-24 ENCOUNTER — Inpatient Hospital Stay: Payer: Medicare Other | Admitting: Hematology and Oncology

## 2021-12-24 ENCOUNTER — Other Ambulatory Visit: Payer: Self-pay

## 2021-12-24 ENCOUNTER — Other Ambulatory Visit (HOSPITAL_COMMUNITY): Payer: Self-pay

## 2021-12-24 ENCOUNTER — Telehealth: Payer: Self-pay | Admitting: Pharmacy Technician

## 2021-12-24 ENCOUNTER — Telehealth: Payer: Self-pay | Admitting: Pharmacist

## 2021-12-24 ENCOUNTER — Encounter: Payer: Self-pay | Admitting: Hematology and Oncology

## 2021-12-24 ENCOUNTER — Inpatient Hospital Stay: Payer: Medicare Other

## 2021-12-24 ENCOUNTER — Other Ambulatory Visit: Payer: Medicare Other

## 2021-12-24 VITALS — BP 139/62 | HR 62 | Temp 98.3°F | Resp 18 | Wt 253.7 lb

## 2021-12-24 DIAGNOSIS — R0781 Pleurodynia: Secondary | ICD-10-CM | POA: Diagnosis not present

## 2021-12-24 DIAGNOSIS — Z823 Family history of stroke: Secondary | ICD-10-CM | POA: Diagnosis not present

## 2021-12-24 DIAGNOSIS — M25512 Pain in left shoulder: Secondary | ICD-10-CM | POA: Diagnosis not present

## 2021-12-24 DIAGNOSIS — M79601 Pain in right arm: Secondary | ICD-10-CM | POA: Diagnosis not present

## 2021-12-24 DIAGNOSIS — C9000 Multiple myeloma not having achieved remission: Secondary | ICD-10-CM

## 2021-12-24 DIAGNOSIS — Z86718 Personal history of other venous thrombosis and embolism: Secondary | ICD-10-CM | POA: Diagnosis not present

## 2021-12-24 DIAGNOSIS — Z5112 Encounter for antineoplastic immunotherapy: Secondary | ICD-10-CM | POA: Diagnosis not present

## 2021-12-24 DIAGNOSIS — M84421D Pathological fracture, right humerus, subsequent encounter for fracture with routine healing: Secondary | ICD-10-CM | POA: Diagnosis not present

## 2021-12-24 DIAGNOSIS — Z8249 Family history of ischemic heart disease and other diseases of the circulatory system: Secondary | ICD-10-CM | POA: Diagnosis not present

## 2021-12-24 DIAGNOSIS — Z7901 Long term (current) use of anticoagulants: Secondary | ICD-10-CM | POA: Diagnosis not present

## 2021-12-24 DIAGNOSIS — M25511 Pain in right shoulder: Secondary | ICD-10-CM | POA: Diagnosis not present

## 2021-12-24 DIAGNOSIS — Z79899 Other long term (current) drug therapy: Secondary | ICD-10-CM | POA: Diagnosis not present

## 2021-12-24 DIAGNOSIS — Z881 Allergy status to other antibiotic agents status: Secondary | ICD-10-CM | POA: Diagnosis not present

## 2021-12-24 LAB — CBC WITH DIFFERENTIAL (CANCER CENTER ONLY)
Abs Immature Granulocytes: 0.03 10*3/uL (ref 0.00–0.07)
Basophils Absolute: 0 10*3/uL (ref 0.0–0.1)
Basophils Relative: 0 %
Eosinophils Absolute: 0.5 10*3/uL (ref 0.0–0.5)
Eosinophils Relative: 6 %
HCT: 40.3 % (ref 39.0–52.0)
Hemoglobin: 14.2 g/dL (ref 13.0–17.0)
Immature Granulocytes: 0 %
Lymphocytes Relative: 13 %
Lymphs Abs: 1.1 10*3/uL (ref 0.7–4.0)
MCH: 32.3 pg (ref 26.0–34.0)
MCHC: 35.2 g/dL (ref 30.0–36.0)
MCV: 91.6 fL (ref 80.0–100.0)
Monocytes Absolute: 1 10*3/uL (ref 0.1–1.0)
Monocytes Relative: 12 %
Neutro Abs: 5.8 10*3/uL (ref 1.7–7.7)
Neutrophils Relative %: 69 %
Platelet Count: 232 10*3/uL (ref 150–400)
RBC: 4.4 MIL/uL (ref 4.22–5.81)
RDW: 12.8 % (ref 11.5–15.5)
WBC Count: 8.4 10*3/uL (ref 4.0–10.5)
nRBC: 0 % (ref 0.0–0.2)

## 2021-12-24 LAB — CMP (CANCER CENTER ONLY)
ALT: 25 U/L (ref 0–44)
AST: 21 U/L (ref 15–41)
Albumin: 4.2 g/dL (ref 3.5–5.0)
Alkaline Phosphatase: 114 U/L (ref 38–126)
Anion gap: 4 — ABNORMAL LOW (ref 5–15)
BUN: 17 mg/dL (ref 8–23)
CO2: 34 mmol/L — ABNORMAL HIGH (ref 22–32)
Calcium: 10.2 mg/dL (ref 8.9–10.3)
Chloride: 101 mmol/L (ref 98–111)
Creatinine: 0.7 mg/dL (ref 0.61–1.24)
GFR, Estimated: >60 mL/min (ref >60)
Glucose, Bld: 98 mg/dL (ref 70–99)
Potassium: 3.4 mmol/L — ABNORMAL LOW (ref 3.5–5.1)
Sodium: 139 mmol/L (ref 135–145)
Total Bilirubin: 0.5 mg/dL (ref 0.3–1.2)
Total Protein: 6.8 g/dL (ref 6.5–8.1)

## 2021-12-24 LAB — LACTATE DEHYDROGENASE: LDH: 179 U/L (ref 98–192)

## 2021-12-24 NOTE — Progress Notes (Signed)
  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 Multiple myeloma not having achieved remission (HCC)  C90.00       DIAGNOSIS:  71 year old gentleman multiple myeloma with lytic lesions in the right humerus, right ulna and right 6th rib  NARRATIVE:  The patient was brought to the [facility].  Identity was confirmed.  All relevant records and images related to the planned course of therapy were reviewed.  The patient freely provided informed written consent to proceed with treatment after reviewing the details related to the planned course of therapy. The consent form was witnessed and verified by the simulation staff.  Then, the patient was set-up in a stable reproducible  supine position for radiation therapy.  CT images were obtained.  Surface markings were placed.  The CT images were loaded into the planning software.  Then the target and avoidance structures were contoured.  Treatment planning then occurred.  The radiation prescription was entered and confirmed.  Then, I designed and supervised the construction of multiple medically necessary complex treatment devices documented in [place] including body positioner and MLCs to shield critical organs.  I have requested : Isodose Plan.    PLAN:  The patient will receive 20 Gy in 10 fractions to the the right humerus, right ulna and right 6th rib  ________________________________  Sheral Apley Tammi Klippel, M.D.

## 2021-12-24 NOTE — Telephone Encounter (Signed)
Oral Oncology Pharmacist Encounter  Received new prescription for Revlimid (lenalidomide) for the treatment of IgA Kappa multiple myeloma in conjunction with daratumumab and dexamethasone, planned duration until disease progression or unacceptable drug toxicity.  CBC w/ Diff and CMP from 12/24/21 assessed, no relevant lab abnormalities requiring baseline dose adjustment required at this time. Plan per staff message is for patient to be on Revlimid 25 mg daily for 21 days on, 7 days off, repeat every 28 days.  Current medication list in Epic reviewed, no relevant/significant DDIs with Revlimid identified.  Evaluated chart and no patient barriers to medication adherence noted.   Patient agreement for treatment documented in MD note on 12/24/21.  Once [employer] authorization # is obtained, prescription will need to be e-scribed to [facility] as this is a limited distribution medication.   Oral Oncology Clinic will continue to follow for insurance authorization, copayment issues, initial counseling and start date.  Leron Croak, PharmD, BCPS, BCOP Hematology/Oncology Clinical Pharmacist [facility] and [place] [phone] 12/24/2021 4:20 PM

## 2021-12-24 NOTE — Telephone Encounter (Signed)
Oral Oncology Patient Advocate Encounter   Received notification that additional information is required by [facility] before funding can be approved for Lenalidomide.   Proof of Income will be submitted once received from the patient.   I will continue to check the status until final determination.   Lady Deutscher, CPhT-Adv Pharmacy Patient Advocate Specialist [place] Patient Advocate Team Direct Number: [phone]  Fax: [phone]

## 2021-12-24 NOTE — Telephone Encounter (Signed)
Oral Oncology Patient Advocate Encounter  After completing a benefits investigation, prior authorization for Lenalidomide is not required at this time through [place] Medicare D.  Patient's copay is $3,491.05.     Lady Deutscher, CPhT-Adv Pharmacy Patient Advocate Specialist [place] Patient Advocate Team Direct Number: [phone]  Fax: [phone]

## 2021-12-24 NOTE — Progress Notes (Signed)
[facility] Telephone:([phone]) [phone]   Fax:([phone]  PROGRESS NOTE  Patient Care Team: Lajean Manes, MD as PCP - General (Internal Medicine) Fay Records, MD as PCP - Cardiology (Cardiology) Sueanne Margarita, MD as PCP - Sleep Medicine (Cardiology) Thompson Grayer, MD as PCP - Electrophysiology (Cardiology) Michael Boston, MD as Consulting Physician (General Surgery) Clarene Essex, MD as Consulting Physician (Gastroenterology) Sueanne Margarita, MD as Consulting Physician (Sleep Medicine) Alda Berthold, DO as Consulting Physician (Neurology)  Hematological/Oncological History # IgA Kappa Multiple Myeloma 05/05/2020: Bone marrow biopsy showed increased number of plasma cells representing 14% of all cells in the aspirate associated with numerous variably sized clusters in the clot and biopsy sections 04/10/2021: M protein 0.3, IgA kappa specificity. Kappa 580.5, Labda 7.0, ratio 82.93.   12/07/2021: Labs show of 1971.3, lambda 5.5, ratio 358.42 12/10/2021: Transition care to Dr. Lorenso Courier. 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 male with medical history significant for IgA Kappa Multiple Myeloma who presents for a follow up visit. The patient's last visit was on 12/10/2021. In the interim since the last visit he had a bone marrow biopsy which confirmed diagnosis of multiple myeloma.  He presents today prior to the start of cycle 1 day 1 of Dara/Dex.  On exam today Travis [name] is accompanied by his wife.  He reports he has been tolerating radiation therapy well.  He notes that he is taking Tylenol about 4000 mg/day.  He is not taking tramadol.Travis [name]  He reports that his arm pain is improving slightly.  He notes he is not currently having any fevers, chills, sweats, nausea, vomiting or diarrhea.  He is not having any side  effects as result of the radiation treatment.  He is willing and able to proceed with Darzalex therapy next week.  A full 10 point ROS is listed below.  MEDICAL HISTORY:  Past Medical History:  Diagnosis Date   Arthritis    "comes and goes; mostly in hands, occasionally elbow" (98/05/1912)   Complication of anesthesia    "just wanted to sleep alot  for couple days S/P VARICOCELE OR" (04/05/2017)   DVT (deep venous thrombosis) (HCC)    LLE   Dysrhythmia    PVCs    GERD (gastroesophageal reflux disease)    History of hiatal hernia    Hypertension    Impaired glucose tolerance    Multiple myeloma (HCC)    Obesity (BMI 30-39.9) 07/26/2016   OSA on CPAP 07/26/2016   Mild with AHI 12.5/hr now on CPAP at 14cm H2O   PAF (paroxysmal atrial fibrillation) ([place])    s/p PVI Ablation in 2018 // Flecainide Rx // Echo 8/21: EF 60-65, no RWMA, mild LVH, normal RV SF, moderate RVE, mild BAE, trivial MR, mild dilation of aortic root (40 mm)   Pneumonia    "may have had walking pneumonia a few years ago" (04/05/2017)   Pre-diabetes    Thrombophlebitis     SURGICAL HISTORY: Past Surgical History:  Procedure Laterality Date   ATRIAL FIBRILLATION ABLATION  04/05/2017   ATRIAL FIBRILLATION ABLATION N/A 04/05/2017   Procedure: ATRIAL FIBRILLATION ABLATION;  Surgeon: Thompson Grayer, MD;  Location: [facility];  Service: Cardiovascular;  Laterality: N/A;   BONE BIOPSY Right 12/17/2021   Procedure: BONE BIOPSY;  Surgeon: Hiram Gash, MD;  Location: [facility];  Service: Orthopedics;  Laterality: Right;   COMPLETE RIGHT HIP REPLACMENT  01/19/2019   EVALUATION UNDER ANESTHESIA WITH HEMORRHOIDECTOMY N/A 02/24/2017   Procedure: EXAM UNDER ANESTHESIA WITH  HEMORRHOIDECTOMY;  Surgeon: Michael Boston, MD;  Location: [facility];  Service: General;  Laterality: N/A;   EXCISIONAL TOTAL HIP ARTHROPLASTY WITH ANTIBIOTIC SPACERS Right 09/25/2020   Procedure: EXCISIONAL TOTAL HIP ARTHROPLASTY WITH  ANTIBIOTIC SPACERS;  Surgeon: Paralee Cancel, MD;  Location: [facility];  Service: Orthopedics;  Laterality: Right;   FLEXIBLE SIGMOIDOSCOPY N/A 04/15/2015   Procedure:  UNSEDATED FLEXIBLE SIGMOIDOSCOPY;  Surgeon: [name], MD;  Location: [facility];  Service: Endoscopy;  Laterality: N/A;   HUMERUS IM NAIL Right 12/17/2021   Procedure: INTRAMEDULLARY (IM) NAIL HUMERAL;  Surgeon: Hiram Gash, MD;  Location: [facility];  Service: Orthopedics;  Laterality: Right;   KNEE ARTHROSCOPY Left ~ 2016   REIMPLANTATION OF TOTAL HIP Right 12/25/2020   Procedure: REIMPLANTATION/REVISION OF RIGHT TOTAL HIP VERSUS REPEAT IRRIGATION AND DEBRIDEMENT;  Surgeon: Paralee Cancel, MD;  Location: [facility];  Service: Orthopedics;  Laterality: Right;   TESTICLE SURGERY  1988   VARICOCELE   TONSILLECTOMY     VARICOSE VEIN SURGERY Bilateral     SOCIAL HISTORY: Social History   Socioeconomic History   Marital status: Married    Spouse name: Not on file   Number of children: 1   Years of education: law school   Highest education level: Not on file  Occupational History   Occupation: lawyer  Tobacco Use   Smoking status: Never   Smokeless tobacco: Never  Vaping Use   Vaping Use: Never used  Substance and Sexual Activity   Alcohol use: Yes    Alcohol/week: 2.0 standard drinks of alcohol    Types: 2 Glasses of wine per week    Comment: 2 glasses of wine a week, occ bourbon, beer   Drug use: No   Sexual activity: Not Currently  Other Topics Concern   Not on file  Social History Narrative   Lives in [place] with spouse in a 2 story home.  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    Social Determinants of Health   Financial Resource Strain: Not on file  Food Insecurity: Not on file  Transportation Needs: Not on file  Physical Activity: Not on file  Stress: Not on file  Social Connections: Not on file  Intimate Partner  Violence: Not on file    FAMILY HISTORY: Family History  Problem Relation Age of Onset   Dementia Mother    Stroke Father    Atrial fibrillation Father    Hypertension Father    Hypertension Paternal Grandfather    Dementia Maternal Grandmother     ALLERGIES:  is allergic to linezolid and vancomycin.  MEDICATIONS:  Current Outpatient Medications  Medication Sig Dispense Refill   acyclovir (ZOVIRAX) 400 MG tablet Take 1 tablet (400 mg total) by mouth 2 (two) times daily. 180 tablet 3   amLODipine (NORVASC) 10 MG tablet Take 10 mg by mouth daily.      apixaban (ELIQUIS) 5 MG TABS tablet TAKE ONE TABLET BY MOUTH TWICE A DAY 60 tablet 6   Ascorbic Acid (VITAMIN C) 100 MG tablet Take 100 mg by mouth daily.     b complex vitamins tablet Take 1 tablet by mouth daily.     cholecalciferol (VITAMIN D3) 25 MCG (1000 UNIT) tablet  Take 1,000 Units by mouth daily.     clobetasol cream (TEMOVATE) 0.05 % Apply topically.     dexamethasone (DECADRON) 4 MG tablet Take 10 tablets (40 mg total) by mouth once a week. 40 tablet 5   flecainide (TAMBOCOR) 50 MG tablet TAKE ONE AND ONE-HALF TABLETS BY MOUTH TWICE A DAY 270 tablet 3   fluticasone (FLONASE) 50 MCG/ACT nasal spray Place 1 spray into both nostrils daily as needed for allergies or rhinitis.     folic acid (FOLVITE) 1 MG tablet Take 1 mg by mouth daily.     furosemide (LASIX) 40 MG tablet Take 1 tablet (40 mg) by mouth every other day alternating with 1 and 1/2 tablets (60 mg) on the opposite days. 113 tablet 3   gabapentin (NEURONTIN) 300 MG capsule TAKE TWO CAPSULES BY MOUTH TWICE A DAY 360 capsule 0   hydrALAZINE (APRESOLINE) 10 MG tablet Take 1 tablet (10 mg total) by mouth 2 (two) times daily. AS NEEDED FOR HIGH BP 180 tablet 3   hydrochlorothiazide (HYDRODIURIL) 25 MG tablet TAKE ONE TABLET BY MOUTH ONE TIME DAILY 90 tablet 3   lenalidomide (REVLIMID) 25 MG capsule Take 1 capsule (25 mg total) by mouth daily. Take 21 days on, 7 days off,  repeat every 28 days. [employer] auth # [id number] Date obtained: 12/25/21 21 capsule 0   lisinopril (ZESTRIL) 40 MG tablet TAKE ONE TABLET BY MOUTH ONE TIME DAILY 90 tablet 3   methocarbamol (ROBAXIN) 500 MG tablet Take 1 tablet (500 mg total) by mouth every 8 (eight) hours as needed for muscle spasms. 30 tablet 0   metoprolol succinate (TOPROL-XL) 50 MG 24 hr tablet Take 0.5 tablets (25 mg total) by mouth daily. 90 tablet 3   oxymetazoline (AFRIN) 0.05 % nasal spray Place 1 spray into both nostrils 2 (two) times daily as needed for congestion.     potassium chloride SA (KLOR-CON M) 20 MEQ tablet TAKE ONE TABLET BY MOUTH ONE TIME DAILY, TAKE 40 MG (2 TABLETS) ON DAYS THAT YOU ALSO TAKE LASIX (FUROSEMIDE) 135 tablet 3   prochlorperazine (COMPAZINE) 10 MG tablet Take 1 tablet (10 mg total) by mouth every 6 (six) hours as needed for nausea or vomiting. 30 tablet 0   psyllium (METAMUCIL) 58.6 % packet Take 1 packet by mouth daily.     sodium chloride (OCEAN) 0.65 % SOLN nasal spray Place 1 spray into both nostrils as needed for congestion.     triamcinolone cream (KENALOG) 0.1 % Apply 1 application topically daily as needed (itching).     No current facility-administered medications for this visit.    REVIEW OF SYSTEMS:   Constitutional: ( - ) fevers, ( - )  chills , ( - ) night sweats Eyes: ( - ) blurriness of vision, ( - ) double vision, ( - ) watery eyes Ears, nose, mouth, throat, and face: ( - ) mucositis, ( - ) sore throat Respiratory: ( - ) cough, ( - ) dyspnea, ( - ) wheezes Cardiovascular: ( - ) palpitation, ( - ) chest discomfort, ( - ) lower extremity swelling Gastrointestinal:  ( - ) nausea, ( - ) heartburn, ( - ) change in bowel habits Skin: ( - ) abnormal skin rashes Lymphatics: ( - ) new lymphadenopathy, ( - ) easy bruising Neurological: ( - ) numbness, ( - ) tingling, ( - ) new weaknesses Behavioral/Psych: ( - ) mood change, ( - ) new changes  All other systems were reviewed with  the  patient and are negative.  PHYSICAL EXAMINATION: ECOG PERFORMANCE STATUS: 1 - Symptomatic but completely ambulatory  Vitals:   12/24/21 1027  BP: 139/62  Pulse: 62  Resp: 18  Temp: 98.3 F (36.8 C)  SpO2: 96%   Filed Weights   12/24/21 1027  Weight: 253 lb 11.2 oz (115.1 kg)    GENERAL: Well-appearing elderly Caucasian male, alert, no distress and comfortable SKIN: skin color, texture, turgor are normal, no rashes or significant lesions EYES: conjunctiva are pink and non-injected, sclera clear LUNGS: clear to auscultation and percussion with normal breathing effort HEART: regular rate & rhythm and no murmurs and no lower extremity edema Musculoskeletal: no cyanosis of digits and no clubbing  PSYCH: alert & oriented x 3, fluent speech NEURO: no focal motor/sensory deficits  LABORATORY DATA:  I have reviewed the data as listed    Latest Ref Rng & Units 12/28/2021    9:41 AM 12/24/2021    9:45 AM 12/07/2021    3:39 PM  CBC  WBC 4.0 - 10.5 K/uL 8.2  8.4  9.8   Hemoglobin 13.0 - 17.0 g/dL 14.0  14.2  15.0   Hematocrit 39.0 - 52.0 % 39.4  40.3  42.1   Platelets 150 - 400 K/uL 280  232  284        Latest Ref Rng & Units 12/24/2021    9:45 AM 12/07/2021    3:39 PM 11/17/2021    3:13 PM  CMP  Glucose 70 - 99 mg/dL 98  109  85   BUN 8 - 23 mg/dL _0 Creatinine 0.61 - 1.24 mg/dL 0.70  0.83  0.80   Sodium 135 - 145 mmol/L 139  140  143   Potassium 3.5 - 5.1 mmol/L 3.4  3.4  3.5   Chloride 98 - 111 mmol/L 101  103  102   CO2 22 - 32 mmol/L 34  28  27   Calcium 8.9 - 10.3 mg/dL 10.2  9.7  9.4   Total Protein 6.5 - 8.1 g/dL 6.8  7.2  6.5   Total Bilirubin 0.3 - 1.2 mg/dL 0.5  0.4  0.3   Alkaline Phos 38 - 126 U/L 114  103  110   AST 15 - 41 U/L _1 ALT 0 - 44 U/L _2 Lab Results  Component Value Date   MPROTEIN 0.2 (H) 12/07/2021   MPROTEIN 0.3 (H) 04/10/2021   MPROTEIN 0.3 (H) 05/05/2020   Lab Results  Component Value Date   KPAFRELGTCHN  1,971.3 (H) 12/07/2021   KPAFRELGTCHN 580.5 (H) 04/10/2021   KPAFRELGTCHN 598.0 (H) 05/05/2020   LAMBDASER 5.5 (L) 12/07/2021   LAMBDASER 7.0 04/10/2021   LAMBDASER 8.2 05/05/2020   KAPLAMBRATIO 2,223.53 (H) 12/28/2021   KAPLAMBRATIO 358.42 (H) 12/07/2021   KAPLAMBRATIO 82.93 (H) 04/10/2021     RADIOGRAPHIC STUDIES: DG Humerus Right  Result Date: 12/19/2021 CLINICAL DATA:  Postop images of the right humerus. History of myeloma. EXAM: RIGHT HUMERUS - 2+ VIEW COMPARISON:  12/15/2021 FINDINGS: Intramedullary nail with 2 proximal interlocking screws in the humeral head and a distal interlocking screw in the distal diaphysis. Again noted is a destructive osseous lesion in the mid humerus compatible with a pathologic fracture. Small lucent line in the mid/distal diaphysis region could represent a nutrient canal but difficult to exclude nondisplaced fracture at this location. Right shoulder appears located on these  views. Lucency in the soft tissues compatible with recent surgery. Alignment of the pathologic fracture in the mid humerus is near anatomic. IMPRESSION: 1. Internal fixation of the pathologic fracture in the mid humerus. 2. Linear lucency in the mid/distal humerus diaphysis may be new and could represent a nondisplaced fracture in this area. 3. These results will be called to the ordering clinician or representative by the Radiologist Assistant, and communication documented in the PACS or [employer]. Electronically Signed   By: Markus Daft M.D.   On: 12/19/2021 09:22   DG Humerus Right  Result Date: 12/19/2021 CLINICAL DATA:  71 year old male with osseous metastatic disease and pathologic fracture EXAM: RIGHT HUMERUS - 2+ VIEW COMPARISON:  Osseous survey 12/15/2021 FINDINGS: Two views of the right humerus demonstrate interval operative repair with intramedullary nail including 2 proximal in a single distal interlocking screw. The nail traverses and irregular permeative lucency in the mid  humeral diaphysis. Lucency extending through the cortex consistent with pathologic fracture. No evidence of immediate hardware complication. IMPRESSION: ORIF of presumed pathologic fracture in the mid humeral diaphysis without evidence of immediate hardware complication. Electronically Signed   By: Jacqulynn Cadet M.D.   On: 12/19/2021 07:07   DG C-Arm 1-60 Min-No Report  Result Date: 12/17/2021 Fluoroscopy was utilized by the requesting physician.  No radiographic interpretation.   DG Bone Survey Met  Result Date: 12/16/2021 CLINICAL DATA:  Multiple myeloma EXAM: METASTATIC BONE SURVEY COMPARISON:  11/10/2017, 12/01/2021, MRI from 12/04/2021 FINDINGS: Lateral view of the skull demonstrates multiple lucency stable in appearance from the prior exam. There is a new 14 mm lucency identified near the vertex likely representing a myelomatous lesion. Additionally there is a lucency identified posteriorly incompletely evaluated on this view suspicious for myelomatous focus. Upper extremities demonstrate a lytic permeative lesion in the midshaft of the right humerus similar to that seen on recent MRI examination. Underlying pathologic fracture is seen. This was present on recent plain film from 12/01/2021 but not well appreciated on the prior bone survey. A more proximal lytic lesion is noted in the region of the inferior aspect of the humeral head consistent with the MRI findings. Lytic lesion is noted in the distal aspect of the left humerus new from prior bone survey consistent with myelomatous lesion. A lucency is noted in the midshaft of the right radius consistent with myeloma lesion. Cervical spine demonstrates degenerative changes without compression deformity. Thoracic spine also demonstrates degenerative change without pedicle abnormality or paraspinal mass. The lumbar spine demonstrates multilevel degenerative change. No compression deformities are seen. Ribcage shows no focal lytic lesion. Bilateral  lower extremities show evidence of right hip replacement. No new lytic lesion is seen. Pelvis also shows no significant lytic lesion. IMPRESSION: Progressive lesions within the skull, right humerus, a distal left humerus and mid right radius are seen. These are consistent with progressive myeloma. Electronically Signed   By: Inez Catalina M.D.   On: 12/16/2021 22:31    ASSESSMENT & PLAN BASHEER MOLCHAN 71 y.o. male with medical history significant for IgA Kappa Multiple Myeloma who presents for a follow up visit.   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  He meets diagnostic criteria by having a serum free light chain ratio greater than 100 and lytic lesions causing pathological fracture of the humerus.  Given this I would recommend we pursued Darzalex, Revlimid, and dexamethasone.  Given his advanced age  I would not recommend transplantation, that this is something we can consider when it comes time to transition to maintenance therapy versus transplant.  The patient voices understanding of the plan moving forward.  # IgA Kappa Multiple Myeloma --Metastatic survey completed 12/15/2021 --Patient underwent fixation of his humerus on 12/17/2021, pathology confirmed plasmacytoma/plasma cell neoplasm.  --Plan to proceed with Darzalex, Revlimid, and dexamethasone as treatment of choice. Start date 12/28/2021.  --Plan to have patient return to clinic in approximately 2 weeks time after the start of treatment.  #Supportive Care -- chemotherapy education complete -- port placement not required -- zofran 15m q8H PRN and compazine 181mPO q6H for nausea -- acyclovir 40021mO BID for VCZ prophylaxis -- Zometa/Xgeva treatment to start once dental clearance is obtained. -- Pain is managed right now with tylenol  Offered stronger pain medication if pain were to worsen.    Orders Placed This Encounter   Procedures   Multiple Myeloma Panel (SPEP&IFE w/QIG)    Standing Status:   Future    Standing Expiration Date:   01/05/2023   Kappa/lambda light chains    Standing Status:   Future    Standing Expiration Date:   01/05/2023    All questions were answered. The patient knows to call the clinic with any problems, questions or concerns.  A total of more than 30 minutes were spent on this encounter with face-to-face time and non-face-to-face time, including preparing to see the patient, ordering tests and/or medications, counseling the patient and coordination of care as outlined above.   JohLedell PeoplesD Department of Hematology/Oncology [facility]: [phone]: [phone]: [email or web address]  01/02/2022 7:34 PM

## 2021-12-25 ENCOUNTER — Other Ambulatory Visit: Payer: Self-pay | Admitting: Hematology and Oncology

## 2021-12-25 ENCOUNTER — Other Ambulatory Visit (HOSPITAL_COMMUNITY): Payer: Self-pay

## 2021-12-25 ENCOUNTER — Other Ambulatory Visit: Payer: Self-pay

## 2021-12-25 ENCOUNTER — Encounter: Payer: Self-pay | Admitting: Hematology and Oncology

## 2021-12-25 ENCOUNTER — Ambulatory Visit
Admission: RE | Admit: 2021-12-25 | Discharge: 2021-12-25 | Disposition: A | Discharge status: Home / Self Care | Payer: Medicare Other | Source: Ambulatory Visit | Attending: Radiation Oncology | Admitting: Radiation Oncology

## 2021-12-25 ENCOUNTER — Inpatient Hospital Stay: Payer: Medicare Other

## 2021-12-25 ENCOUNTER — Telehealth: Payer: Self-pay

## 2021-12-25 DIAGNOSIS — C9000 Multiple myeloma not having achieved remission: Secondary | ICD-10-CM

## 2021-12-25 DIAGNOSIS — D472 Monoclonal gammopathy: Secondary | ICD-10-CM | POA: Insufficient documentation

## 2021-12-25 MED ORDER — LENALIDOMIDE 25 MG PO CAPS: 25.0000 mg | ORAL_CAPSULE | Freq: Every day | ORAL | 0 refills | Status: DC

## 2021-12-25 NOTE — Progress Notes (Signed)
Called pt to introduce myself as his Arboriculturist, discuss copay assistance and the [employer].  Pt thinks he's met his deductible will call his ins company to make sure treatment will be covered at 100%.  If not we will apply for copay assistance.  I also informed him of the [employer] and went over what it covers but he declined the grant stating he may not qualify and would like to leave it for someone in greater need.  I will meet him on 12/28/21 to see how to proceed w/ applying for copay assistance and to give him my card for any questions or concerns he may have in the future.

## 2021-12-25 NOTE — Telephone Encounter (Signed)
Completed Lenalidomide paperwork faxed to [facility].

## 2021-12-25 NOTE — Telephone Encounter (Signed)
Oral Oncology Patient Advocate Encounter   Met with patient in lobby to collect paperwork requested by [employer].  POI submitted via online portal on 12/25/2021.    I will continue to check the status until final determination.   Lady Deutscher, CPhT-Adv Pharmacy Patient Advocate Specialist [facility] Patient Advocate Team Direct Number: [phone]  Fax: [phone]

## 2021-12-28 ENCOUNTER — Other Ambulatory Visit: Payer: Medicare Other

## 2021-12-28 ENCOUNTER — Other Ambulatory Visit: Payer: Self-pay

## 2021-12-28 ENCOUNTER — Ambulatory Visit: Payer: Medicare Other | Admitting: Hematology and Oncology

## 2021-12-28 ENCOUNTER — Encounter: Payer: Self-pay | Admitting: Hematology and Oncology

## 2021-12-28 ENCOUNTER — Ambulatory Visit: Payer: Medicare Other

## 2021-12-28 ENCOUNTER — Other Ambulatory Visit: Payer: Self-pay | Admitting: *Deleted

## 2021-12-28 ENCOUNTER — Inpatient Hospital Stay: Payer: Medicare Other

## 2021-12-28 VITALS — BP 141/77 | HR 60 | Temp 98.1°F | Resp 18 | Wt 254.5 lb

## 2021-12-28 DIAGNOSIS — M25512 Pain in left shoulder: Secondary | ICD-10-CM | POA: Diagnosis not present

## 2021-12-28 DIAGNOSIS — Z8249 Family history of ischemic heart disease and other diseases of the circulatory system: Secondary | ICD-10-CM | POA: Diagnosis not present

## 2021-12-28 DIAGNOSIS — D472 Monoclonal gammopathy: Secondary | ICD-10-CM

## 2021-12-28 DIAGNOSIS — Z881 Allergy status to other antibiotic agents status: Secondary | ICD-10-CM | POA: Diagnosis not present

## 2021-12-28 DIAGNOSIS — C9000 Multiple myeloma not having achieved remission: Secondary | ICD-10-CM

## 2021-12-28 DIAGNOSIS — R0781 Pleurodynia: Secondary | ICD-10-CM | POA: Diagnosis not present

## 2021-12-28 DIAGNOSIS — Z5112 Encounter for antineoplastic immunotherapy: Secondary | ICD-10-CM | POA: Diagnosis not present

## 2021-12-28 DIAGNOSIS — Z86718 Personal history of other venous thrombosis and embolism: Secondary | ICD-10-CM | POA: Diagnosis not present

## 2021-12-28 DIAGNOSIS — M25511 Pain in right shoulder: Secondary | ICD-10-CM | POA: Diagnosis not present

## 2021-12-28 DIAGNOSIS — Z7901 Long term (current) use of anticoagulants: Secondary | ICD-10-CM | POA: Diagnosis not present

## 2021-12-28 DIAGNOSIS — Z79899 Other long term (current) drug therapy: Secondary | ICD-10-CM | POA: Diagnosis not present

## 2021-12-28 DIAGNOSIS — Z823 Family history of stroke: Secondary | ICD-10-CM | POA: Diagnosis not present

## 2021-12-28 DIAGNOSIS — M79601 Pain in right arm: Secondary | ICD-10-CM | POA: Diagnosis not present

## 2021-12-28 LAB — CBC WITH DIFFERENTIAL (CANCER CENTER ONLY)
Abs Immature Granulocytes: 0.03 10*3/uL (ref 0.00–0.07)
Basophils Absolute: 0 10*3/uL (ref 0.0–0.1)
Basophils Relative: 0 %
Eosinophils Absolute: 0.3 10*3/uL (ref 0.0–0.5)
Eosinophils Relative: 3 %
HCT: 39.4 % (ref 39.0–52.0)
Hemoglobin: 14 g/dL (ref 13.0–17.0)
Immature Granulocytes: 0 %
Lymphocytes Relative: 13 %
Lymphs Abs: 1 10*3/uL (ref 0.7–4.0)
MCH: 32 pg (ref 26.0–34.0)
MCHC: 35.5 g/dL (ref 30.0–36.0)
MCV: 90.2 fL (ref 80.0–100.0)
Monocytes Absolute: 0.7 10*3/uL (ref 0.1–1.0)
Monocytes Relative: 9 %
Neutro Abs: 6.1 10*3/uL (ref 1.7–7.7)
Neutrophils Relative %: 75 %
Platelet Count: 280 10*3/uL (ref 150–400)
RBC: 4.37 MIL/uL (ref 4.22–5.81)
RDW: 12.8 % (ref 11.5–15.5)
WBC Count: 8.2 10*3/uL (ref 4.0–10.5)
nRBC: 0 % (ref 0.0–0.2)

## 2021-12-28 LAB — TYPE AND SCREEN
ABO/RH(D): O POS
Antibody Screen: NEGATIVE

## 2021-12-28 LAB — SAMPLE TO BLOOD BANK

## 2021-12-28 MED ORDER — MONTELUKAST SODIUM 10 MG PO TABS
10.0000 mg | ORAL_TABLET | Freq: Once | ORAL | Status: AC
  Administered 2021-12-28: 10 mg via ORAL
  Filled 2021-12-28: qty 1

## 2021-12-28 MED ORDER — DIPHENHYDRAMINE HCL 25 MG PO CAPS
50.0000 mg | ORAL_CAPSULE | Freq: Once | ORAL | Status: AC
  Administered 2021-12-28: 50 mg via ORAL
  Filled 2021-12-28: qty 2

## 2021-12-28 MED ORDER — DARATUMUMAB-HYALURONIDASE-FIHJ 1800-30000 MG-UT/15ML ~~LOC~~ SOLN
1800.0000 mg | Freq: Once | SUBCUTANEOUS | Status: AC
  Administered 2021-12-28: 1800 mg via SUBCUTANEOUS
  Filled 2021-12-28: qty 15

## 2021-12-28 MED ORDER — ACETAMINOPHEN 325 MG PO TABS: 650.0000 mg | ORAL_TABLET | Freq: Once | ORAL | Status: DC

## 2021-12-28 NOTE — Progress Notes (Signed)
Met w/ pt and he would like to apply for copay assistance so I completed the online application w/ the Patient [employer]ocate foundation.  PAF couldn't verify his income so they are requesting him to send proof of his SSN and household income by 01/27/22.  If the don't receive this information by 01/27/22, they will close the account.

## 2021-12-28 NOTE — Progress Notes (Signed)
Stayed for 2 hour post daralzex faspro injection observation. VSS, no signs of distress noted upon discharge.

## 2021-12-28 NOTE — Progress Notes (Signed)
Pt states he took 1000 mg of tylenol at 8 AM prior to arrival

## 2021-12-28 NOTE — Patient Instructions (Signed)
[facility] ONCOL[facility]  Discharge Instructions: Thank you for choosing [facility] to provide your oncology and hematology care.   If you have a lab appointment with the [facility], please go directly to the [facility] and check in at the registration area.   Wear comfortable clothing and clothing appropriate for easy access to any Portacath or PICC line.   We strive to give you quality time with your provider. You may need to reschedule your appointment if you arrive late (15 or more minutes).  Arriving late affects you and other patients whose appointments are after yours.  Also, if you miss three or more appointments without notifying the office, you may be dismissed from the clinic at the provider's discretion.      For prescription refill requests, have your pharmacy contact our office and allow 72 hours for refills to be completed.    Today you received the following chemotherapy and/or immunotherapy agents; Daratumumab-hyalurondiase-fihj (Darzalex Faspro)      To help prevent nausea and vomiting after your treatment, we encourage you to take your nausea medication as directed.  BELOW ARE SYMPTOMS THAT SHOULD BE REPORTED IMMEDIATELY: *FEVER GREATER THAN 100.4 F (38 C) OR HIGHER *CHILLS OR SWEATING *NAUSEA AND VOMITING THAT IS NOT CONTROLLED WITH YOUR NAUSEA MEDICATION *UNUSUAL SHORTNESS OF BREATH *UNUSUAL BRUISING OR BLEEDING *URINARY PROBLEMS (pain or burning when urinating, or frequent urination) *BOWEL PROBLEMS (unusual diarrhea, constipation, pain near the anus) TENDERNESS IN MOUTH AND THROAT WITH OR WITHOUT PRESENCE OF ULCERS (sore throat, sores in mouth, or a toothache) UNUSUAL RASH, SWELLING OR PAIN  UNUSUAL VAGINAL DISCHARGE OR ITCHING   Items with * indicate a potential emergency and should be followed up as soon as possible or go to the Emergency Department if any problems should occur.  Please show the CHEMOTHERAPY ALERT CARD or  IMMUNOTHERAPY ALERT CARD at check-in to the Emergency Department and triage nurse.  Should you have questions after your visit or need to cancel or reschedule your appointment, please contact [facility]  Dept: [phone]  and follow the prompts.  Office hours are 8:00 a.m. to 4:30 p.m. Monday - Friday. Please note that voicemails left after 4:00 p.m. may not be returned until the following business day.  We are closed weekends and major holidays. You have access to a nurse at all times for urgent questions. Please call the main number to the clinic Dept: [phone] and follow the prompts.   For any non-urgent questions, you may also contact your provider using MyChart. We now offer e-Visits for anyone 56 and older to request care online for non-urgent symptoms. For details visit [email or web address].   Also download the MyChart app! Go to the app store, search "MyChart", open the app, select [facility], and log in with your MyChart username and password.  Masks are optional in the cancer centers. If you would like for your care team to wear a mask while they are taking care of you, please let them know. You may have one support Travis [name] who is at least 71 years old accompany you for your appointments.

## 2021-12-29 ENCOUNTER — Telehealth: Payer: Self-pay | Admitting: *Deleted

## 2021-12-29 DIAGNOSIS — D472 Monoclonal gammopathy: Secondary | ICD-10-CM | POA: Diagnosis not present

## 2021-12-29 DIAGNOSIS — C9000 Multiple myeloma not having achieved remission: Secondary | ICD-10-CM | POA: Diagnosis not present

## 2021-12-29 DIAGNOSIS — G4733 Obstructive sleep apnea (adult) (pediatric): Secondary | ICD-10-CM | POA: Diagnosis not present

## 2021-12-29 LAB — PRETREATMENT RBC PHENOTYPE: DAT, IgG: NEGATIVE

## 2021-12-29 NOTE — Telephone Encounter (Signed)
-----   Message from [name], RN sent at 12/28/2021  2:43 PM EDT ----- Regarding: First time darzalex Patient of Dr Lorenso Courier, first time Darzalex Faspro. Did well, no signs of distress noted at discharge.

## 2021-12-29 NOTE — Telephone Encounter (Signed)
Called pt to see how he did with his treatment & her reports doing well without c/o's.  He reports some itching on his head last hs but went away.  He reports knowing how to reach [place] & will call if needed.

## 2021-12-29 NOTE — Telephone Encounter (Signed)
Pt called to f/u about revlemid medication. Advised pt that per [employer], they are still waiting on grant approval, updated yesterday and once approved, pharmacy will verify and he will be contacted by [employer]. Pt verbalized understanding.

## 2021-12-30 ENCOUNTER — Encounter: Payer: Self-pay | Admitting: Hematology and Oncology

## 2021-12-30 ENCOUNTER — Other Ambulatory Visit (HOSPITAL_COMMUNITY): Payer: Self-pay

## 2021-12-30 ENCOUNTER — Ambulatory Visit: Payer: Medicare Other

## 2021-12-30 ENCOUNTER — Ambulatory Visit: Payer: Medicare Other | Admitting: Hematology and Oncology

## 2021-12-30 ENCOUNTER — Other Ambulatory Visit: Payer: Medicare Other

## 2021-12-30 LAB — UPEP/UIFE/LIGHT CHAINS/TP, 24-HR UR
% BETA, Urine: 82.8 %
ALPHA 1 URINE: 0.9 %
Albumin, U: 10.2 %
Alpha 2, Urine: 5.3 %
Free Kappa/Lambda Ratio: 2223.53 — ABNORMAL HIGH (ref 1.83–14.26)
Free Lambda Lt Chains,Ur: 5.61 mg/L (ref 0.27–15.21)
GAMMA GLOBULIN URINE: 0.8 %
M-SPIKE %, Urine: 77.4 % — ABNORMAL HIGH
M-Spike, Mg/24 Hr: 1553 mg/24 hr — ABNORMAL HIGH
Total Protein, Urine-Ur/day: 2006 mg/24 hr — ABNORMAL HIGH (ref 30–150)
Total Protein, Urine: 100.3 mg/dL
Total Volume: 2000

## 2021-12-30 NOTE — Telephone Encounter (Signed)
Oral Oncology Patient Advocate Encounter  Called to check status of grant determination for Lenalidomide through [employer].  Status is pending income verification ( [place] size & 2nd page of 1040).   Phone: [phone]   I will continue to check status until final determination.  Patient is willing to go ahead and pay copay for medication at this time (filling with [employer]). If grant comes back approved the amount is eligible for reimbursement through [employer].  Lady Deutscher, CPhT-Adv Oncology Pharm[facility] Patient [place] Direct Number: [phone]  Fax: [phone],

## 2021-12-30 NOTE — Progress Notes (Signed)
Pt provided the requested docs needed to complete the application process w/ the Patient Advocate foundation but he was declined because his current income is not within the program's limit.  I will notify the pt.

## 2021-12-31 NOTE — Telephone Encounter (Signed)
Oral Chemotherapy Pharmacist Encounter  I spoke with patient for overview of: Revlimid (lenalidomide) for the treatment of IgA Kappa multiple myeloma in conjunction with daratumumab and dexamethasone, planned duration until disease progression or unacceptable drug toxicity.  Counseled patient on administration, dosing, side effects, monitoring, drug-food interactions, safe handling, storage, and disposal.  Patient will take Revlimid 29m capsules, 1 capsule by mouth once daily, without regard to food, with a full glass of water.  Revlimid will be given 21 days on, 7 days off, repeat every 28 days.  Patient will take dexamethasone 438mtablets, 10 tablets (4046mby mouth once weekly with breakfast.  Revlimid start date: 12/31/21 PM  Adverse effects of Revlimid include but are not limited to: nausea, constipation, diarrhea, abdominal pain, rash, fatigue, and decreased blood counts.   Patient has picked up acyclovir and dexamethasone prescriptions. Patient is already on apixaban - this is adequate coverage for VTE prophylaxis while on Revlimid.  Reviewed with patient importance of keeping a medication schedule and plan for any missed doses. No barriers to medication adherence identified.  Medication reconciliation performed and medication/allergy list updated.  Insurance authorization for Revlimid has been obtained. Revlimid prescription is being dispensed from [facility] it is a limited distribution medication.  All questions answered.  Mr. WilBankaiced understanding and appreciation.   Medication education handout placed in mail for patient. Patient knows to call the office with questions or concerns. Oral Chemotherapy Clinic phone number provided to patient.   RebLeron CroakharmD, BCPS, BCOSt Joseph'S Hospital Northmatology/Oncology Clinical Pharmacist [place] Sidled [phone]/2023 10:32 AM

## 2022-01-01 DIAGNOSIS — M65322 Trigger finger, left index finger: Secondary | ICD-10-CM | POA: Diagnosis not present

## 2022-01-02 ENCOUNTER — Encounter: Payer: Self-pay | Admitting: Hematology and Oncology

## 2022-01-05 ENCOUNTER — Inpatient Hospital Stay: Payer: Medicare Other

## 2022-01-05 ENCOUNTER — Ambulatory Visit: Payer: Medicare Other

## 2022-01-05 ENCOUNTER — Other Ambulatory Visit: Payer: Self-pay

## 2022-01-05 ENCOUNTER — Inpatient Hospital Stay: Payer: Medicare Other | Admitting: Hematology and Oncology

## 2022-01-05 VITALS — BP 148/59 | HR 63 | Temp 98.3°F | Resp 18 | Ht 70.0 in | Wt 254.3 lb

## 2022-01-05 VITALS — BP 136/65 | HR 59 | Temp 98.6°F | Resp 17

## 2022-01-05 DIAGNOSIS — D472 Monoclonal gammopathy: Secondary | ICD-10-CM | POA: Insufficient documentation

## 2022-01-05 DIAGNOSIS — Z7952 Long term (current) use of systemic steroids: Secondary | ICD-10-CM | POA: Insufficient documentation

## 2022-01-05 DIAGNOSIS — Z79899 Other long term (current) drug therapy: Secondary | ICD-10-CM | POA: Insufficient documentation

## 2022-01-05 DIAGNOSIS — M47814 Spondylosis without myelopathy or radiculopathy, thoracic region: Secondary | ICD-10-CM | POA: Insufficient documentation

## 2022-01-05 DIAGNOSIS — R0781 Pleurodynia: Secondary | ICD-10-CM | POA: Insufficient documentation

## 2022-01-05 DIAGNOSIS — Z7901 Long term (current) use of anticoagulants: Secondary | ICD-10-CM | POA: Insufficient documentation

## 2022-01-05 DIAGNOSIS — M47816 Spondylosis without myelopathy or radiculopathy, lumbar region: Secondary | ICD-10-CM | POA: Insufficient documentation

## 2022-01-05 DIAGNOSIS — C9000 Multiple myeloma not having achieved remission: Secondary | ICD-10-CM

## 2022-01-05 DIAGNOSIS — Z881 Allergy status to other antibiotic agents status: Secondary | ICD-10-CM | POA: Insufficient documentation

## 2022-01-05 DIAGNOSIS — Z7961 Long term (current) use of immunomodulator: Secondary | ICD-10-CM | POA: Insufficient documentation

## 2022-01-05 DIAGNOSIS — M47812 Spondylosis without myelopathy or radiculopathy, cervical region: Secondary | ICD-10-CM | POA: Insufficient documentation

## 2022-01-05 DIAGNOSIS — Z5112 Encounter for antineoplastic immunotherapy: Secondary | ICD-10-CM | POA: Insufficient documentation

## 2022-01-05 DIAGNOSIS — Z79624 Long term (current) use of inhibitors of nucleotide synthesis: Secondary | ICD-10-CM | POA: Insufficient documentation

## 2022-01-05 DIAGNOSIS — Z8249 Family history of ischemic heart disease and other diseases of the circulatory system: Secondary | ICD-10-CM | POA: Insufficient documentation

## 2022-01-05 DIAGNOSIS — R197 Diarrhea, unspecified: Secondary | ICD-10-CM | POA: Insufficient documentation

## 2022-01-05 DIAGNOSIS — G629 Polyneuropathy, unspecified: Secondary | ICD-10-CM | POA: Insufficient documentation

## 2022-01-05 DIAGNOSIS — Z823 Family history of stroke: Secondary | ICD-10-CM | POA: Insufficient documentation

## 2022-01-05 DIAGNOSIS — I480 Paroxysmal atrial fibrillation: Secondary | ICD-10-CM | POA: Insufficient documentation

## 2022-01-05 DIAGNOSIS — Z818 Family history of other mental and behavioral disorders: Secondary | ICD-10-CM | POA: Insufficient documentation

## 2022-01-05 DIAGNOSIS — I10 Essential (primary) hypertension: Secondary | ICD-10-CM | POA: Insufficient documentation

## 2022-01-05 DIAGNOSIS — Z86718 Personal history of other venous thrombosis and embolism: Secondary | ICD-10-CM | POA: Insufficient documentation

## 2022-01-05 LAB — CBC WITH DIFFERENTIAL (CANCER CENTER ONLY)
Abs Immature Granulocytes: 0.03 10*3/uL (ref 0.00–0.07)
Basophils Absolute: 0 10*3/uL (ref 0.0–0.1)
Basophils Relative: 0 %
Eosinophils Absolute: 0.3 10*3/uL (ref 0.0–0.5)
Eosinophils Relative: 3 %
HCT: 40.6 % (ref 39.0–52.0)
Hemoglobin: 14.3 g/dL (ref 13.0–17.0)
Immature Granulocytes: 0 %
Lymphocytes Relative: 8 %
Lymphs Abs: 0.7 10*3/uL (ref 0.7–4.0)
MCH: 31.9 pg (ref 26.0–34.0)
MCHC: 35.2 g/dL (ref 30.0–36.0)
MCV: 90.6 fL (ref 80.0–100.0)
Monocytes Absolute: 0.7 10*3/uL (ref 0.1–1.0)
Monocytes Relative: 9 %
Neutro Abs: 6.3 10*3/uL (ref 1.7–7.7)
Neutrophils Relative %: 80 %
Platelet Count: 257 10*3/uL (ref 150–400)
RBC: 4.48 MIL/uL (ref 4.22–5.81)
RDW: 13.2 % (ref 11.5–15.5)
WBC Count: 8.1 10*3/uL (ref 4.0–10.5)
nRBC: 0 % (ref 0.0–0.2)

## 2022-01-05 LAB — CMP (CANCER CENTER ONLY)
ALT: 26 U/L (ref 0–44)
AST: 16 U/L (ref 15–41)
Albumin: 4.2 g/dL (ref 3.5–5.0)
Alkaline Phosphatase: 135 U/L — ABNORMAL HIGH (ref 38–126)
Anion gap: 7 (ref 5–15)
BUN: 15 mg/dL (ref 8–23)
CO2: 31 mmol/L (ref 22–32)
Calcium: 9.8 mg/dL (ref 8.9–10.3)
Chloride: 102 mmol/L (ref 98–111)
Creatinine: 0.79 mg/dL (ref 0.61–1.24)
GFR, Estimated: >60 mL/min (ref >60)
Glucose, Bld: 111 mg/dL — ABNORMAL HIGH (ref 70–99)
Potassium: 3.5 mmol/L (ref 3.5–5.1)
Sodium: 140 mmol/L (ref 135–145)
Total Bilirubin: 0.5 mg/dL (ref 0.3–1.2)
Total Protein: 6.4 g/dL — ABNORMAL LOW (ref 6.5–8.1)

## 2022-01-05 LAB — LACTATE DEHYDROGENASE: LDH: 143 U/L (ref 98–192)

## 2022-01-05 MED ORDER — DARATUMUMAB-HYALURONIDASE-FIHJ 1800-30000 MG-UT/15ML ~~LOC~~ SOLN
1800.0000 mg | Freq: Once | SUBCUTANEOUS | Status: AC
  Administered 2022-01-05: 1800 mg via SUBCUTANEOUS
  Filled 2022-01-05: qty 15

## 2022-01-05 MED ORDER — DIPHENHYDRAMINE HCL 25 MG PO CAPS
50.0000 mg | ORAL_CAPSULE | Freq: Once | ORAL | Status: AC
  Administered 2022-01-05: 50 mg via ORAL
  Filled 2022-01-05: qty 2

## 2022-01-05 MED ORDER — MONTELUKAST SODIUM 10 MG PO TABS
10.0000 mg | ORAL_TABLET | Freq: Once | ORAL | Status: AC
  Administered 2022-01-05: 10 mg via ORAL
  Filled 2022-01-05: qty 1

## 2022-01-05 MED ORDER — ACETAMINOPHEN 325 MG PO TABS
650.0000 mg | ORAL_TABLET | Freq: Once | ORAL | Status: AC
  Administered 2022-01-05: 650 mg via ORAL
  Filled 2022-01-05: qty 2

## 2022-01-05 NOTE — Progress Notes (Signed)
Patient observed for 1 hour post-darzalex faspro injection. No complaints, vitals stable, ambulatory to lobby.

## 2022-01-05 NOTE — Patient Instructions (Signed)
[facility]  Discharge Instructions: Thank you for choosing [facility] to provide your oncology and hematology care.   If you have a lab appointment with the Cancer Center, please go directly to the Cancer Center and check in at the registration area.   Wear comfortable clothing and clothing appropriate for easy access to any Portacath or PICC line.   We strive to give you quality time with your provider. You may need to reschedule your appointment if you arrive late (15 or more minutes).  Arriving late affects you and other patients whose appointments are after yours.  Also, if you miss three or more appointments without notifying the office, you may be dismissed from the clinic at the provider's discretion.      For prescription refill requests, have your pharmacy contact our office and allow 72 hours for refills to be completed.    Today you received the following chemotherapy and/or immunotherapy agents darzalex faspro      To help prevent nausea and vomiting after your treatment, we encourage you to take your nausea medication as directed.  BELOW ARE SYMPTOMS THAT SHOULD BE REPORTED IMMEDIATELY: *FEVER GREATER THAN 100.4 F (38 C) OR HIGHER *CHILLS OR SWEATING *NAUSEA AND VOMITING THAT IS NOT CONTROLLED WITH YOUR NAUSEA MEDICATION *UNUSUAL SHORTNESS OF BREATH *UNUSUAL BRUISING OR BLEEDING *URINARY PROBLEMS (pain or burning when urinating, or frequent urination) *BOWEL PROBLEMS (unusual diarrhea, constipation, pain near the anus) TENDERNESS IN MOUTH AND THROAT WITH OR WITHOUT PRESENCE OF ULCERS (sore throat, sores in mouth, or a toothache) UNUSUAL RASH, SWELLING OR PAIN  UNUSUAL VAGINAL DISCHARGE OR ITCHING   Items with * indicate a potential emergency and should be followed up as soon as possible or go to the Emergency Department if any problems should occur.  Please show the CHEMOTHERAPY ALERT CARD or IMMUNOTHERAPY ALERT CARD at  check-in to the Emergency Department and triage nurse.  Should you have questions after your visit or need to cancel or reschedule your appointment, please contact [facility]  Dept: [phone]  and follow the prompts.  Office hours are 8:00 a.m. to 4:30 p.m. Monday - Friday. Please note that voicemails left after 4:00 p.m. may not be returned until the following business day.  We are closed weekends and major holidays. You have access to a nurse at all times for urgent questions. Please call the main number to the clinic Dept: [phone] and follow the prompts.   For any non-urgent questions, you may also contact your provider using MyChart. We now offer e-Visits for anyone 18 and older to request care online for non-urgent symptoms. For details visit [email or web address].   Also download the MyChart app! Go to the app store, search "MyChart", open the app, select [facility], and log in with your MyChart username and password.  Masks are optional in the cancer centers. If you would like for your care team to wear a mask while they are taking care of you, please let them know. You may have one support person who is at least 71 years old accompany you for your appointments. 

## 2022-01-05 NOTE — Progress Notes (Signed)
[name]   [phone]  PROGRESS NOTE  Patient Care Team: Lajean Manes, MD as PCP - General (Internal Medicine) Fay Records, MD as PCP - Cardiology (Cardiology) Sueanne Margarita, MD as PCP - Sleep Medicine (Cardiology) Thompson Grayer, MD as PCP - Electrophysiology (Cardiology) Michael Boston, MD as Consulting Physician (General Surgery) Clarene Essex, MD as Consulting Physician (Gastroenterology) Sueanne Margarita, MD as Consulting Physician (Sleep Medicine) Alda Berthold, DO as Consulting Physician (Neurology)  Hematological/Oncological History # IgA Kappa Multiple Myeloma 05/05/2020: Bone marrow biopsy showed increased number of plasma cells representing 14% of all cells in the aspirate associated with numerous variably sized clusters in the clot and biopsy sections 04/10/2021: M protein 0.3, IgA kappa specificity. Kappa 580.5, Labda 7.0, ratio 82.93.   12/07/2021: Labs show of 1971.3, lambda 5.5, ratio 358.42 12/10/2021: Transition care to Dr. Lorenso Courier. 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 male with medical history significant for IgA Kappa Multiple Myeloma who presents for a follow up visit. The patient's last visit was on 12/24/2021. In the interim since the last visit he has started cycle 1 of Dara/Dex.  On exam today [name] is accompanied by his wife.  He reports he took his first dose of Revlimid on 01/01/2022.  He reports that he began having some itching on Saturday night which worsened on Sunday.  He reports was "impossible to sleep" he notes that it was mostly in his scalp.  He was taking Claritin but ice on his neck which helped most of yesterday.  He has not taken any Benadryl and only taking Claritin for this.  He notes that the really "kicked his butt".  He is still  taking at this time.  He notes that he is not having any nausea, vomiting, or diarrhea.  He has had some occasional sweats but his appetite has been good and his weight is stable.  A full 10 point ROS is listed below.  MEDICAL HISTORY:  Past Medical History:  Diagnosis Date   Arthritis    "comes and goes; mostly in hands, occasionally elbow" (43/07/2949)   Complication of anesthesia    "just wanted to sleep alot  for couple days S/P VARICOCELE OR" (04/05/2017)   DVT (deep venous thrombosis) (HCC)    LLE   Dysrhythmia    PVCs    GERD (gastroesophageal reflux disease)    History of hiatal hernia    Hypertension    Impaired glucose tolerance    Multiple myeloma (HCC)    Obesity (BMI 30-39.9) 07/26/2016   OSA on CPAP 07/26/2016   Mild with AHI 12.5/hr now on CPAP at 14cm H2O   PAF (paroxysmal atrial fibrillation) ([place])    s/p PVI Ablation in 2018 // Flecainide Rx // Echo 8/21: EF 60-65, no RWMA, mild LVH, normal RV SF, moderate RVE, mild BAE, trivial MR, mild dilation of aortic root (40 mm)   Pneumonia    "may have had walking pneumonia a few years ago" (04/05/2017)   Pre-diabetes    Thrombophlebitis     SURGICAL HISTORY: Past Surgical History:  Procedure Laterality Date   ATRIAL FIBRILLATION ABLATION  04/05/2017   ATRIAL FIBRILLATION ABLATION N/A 04/05/2017   Procedure: ATRIAL FIBRILLATION ABLATION;  Surgeon: Thompson Grayer, MD;  Location: [facility];  Service: Cardiovascular;  Laterality: N/A;   BONE  BIOPSY Right 12/17/2021   Procedure: BONE BIOPSY;  Surgeon: Hiram Gash, MD;  Location: [facility];  Service: Orthopedics;  Laterality: Right;   COMPLETE RIGHT HIP REPLACMENT  01/19/2019   EVALUATION UNDER ANESTHESIA WITH HEMORRHOIDECTOMY N/A 02/24/2017   Procedure: EXAM UNDER ANESTHESIA WITH  HEMORRHOIDECTOMY;  Surgeon: Michael Boston, MD;  Location: [facility];  Service: General;  Laterality: N/A;   EXCISIONAL TOTAL HIP ARTHROPLASTY WITH ANTIBIOTIC SPACERS Right  09/25/2020   Procedure: EXCISIONAL TOTAL HIP ARTHROPLASTY WITH ANTIBIOTIC SPACERS;  Surgeon: Paralee Cancel, MD;  Location: [facility];  Service: Orthopedics;  Laterality: Right;   FLEXIBLE SIGMOIDOSCOPY N/A 04/15/2015   Procedure:  UNSEDATED FLEXIBLE SIGMOIDOSCOPY;  Surgeon: [name], MD;  Location: [facility];  Service: Endoscopy;  Laterality: N/A;   HUMERUS IM NAIL Right 12/17/2021   Procedure: INTRAMEDULLARY (IM) NAIL HUMERAL;  Surgeon: Hiram Gash, MD;  Location: [facility];  Service: Orthopedics;  Laterality: Right;   KNEE ARTHROSCOPY Left ~ 2016   REIMPLANTATION OF TOTAL HIP Right 12/25/2020   Procedure: REIMPLANTATION/REVISION OF RIGHT TOTAL HIP VERSUS REPEAT IRRIGATION AND DEBRIDEMENT;  Surgeon: Paralee Cancel, MD;  Location: [facility];  Service: Orthopedics;  Laterality: Right;   TESTICLE SURGERY  1988   VARICOCELE   TONSILLECTOMY     VARICOSE VEIN SURGERY Bilateral     SOCIAL HISTORY: Social History   Socioeconomic History   Marital status: Married    Spouse name: Not on file   Number of children: 1   Years of education: law school   Highest education level: Not on file  Occupational History   Occupation: lawyer  Tobacco Use   Smoking status: Never   Smokeless tobacco: Never  Vaping Use   Vaping Use: Never used  Substance and Sexual Activity   Alcohol use: Yes    Alcohol/week: 2.0 standard drinks of alcohol    Types: 2 Glasses of wine per week    Comment: 2 glasses of wine a week, occ bourbon, beer   Drug use: No   Sexual activity: Not Currently  Other Topics Concern   Not on file  Social History Narrative   Lives in [place] with spouse in a 2 story home.  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    Social Determinants of Health   Financial Resource Strain: Not on file  Food Insecurity: Not on file  Transportation Needs: Not on file  Physical Activity: Not on file  Stress: Not on  file  Social Connections: Not on file  Intimate Partner Violence: Not on file    FAMILY HISTORY: Family History  Problem Relation Age of Onset   Dementia Mother    Stroke Father    Atrial fibrillation Father    Hypertension Father    Hypertension Paternal Grandfather    Dementia Maternal Grandmother     ALLERGIES:  is allergic to linezolid and vancomycin.  MEDICATIONS:  Current Outpatient Medications  Medication Sig Dispense Refill   acyclovir (ZOVIRAX) 400 MG tablet Take 1 tablet (400 mg total) by mouth 2 (two) times daily. 180 tablet 3   amLODipine (NORVASC) 10 MG tablet Take 10 mg by mouth daily.      apixaban (ELIQUIS) 5 MG TABS tablet TAKE ONE TABLET BY MOUTH TWICE A DAY 60 tablet 6   Ascorbic Acid (VITAMIN C) 100 MG tablet Take 100 mg by mouth daily.     b  complex vitamins tablet Take 1 tablet by mouth daily.     cholecalciferol (VITAMIN D3) 25 MCG (1000 UNIT) tablet Take 1,000 Units by mouth daily.     clobetasol cream (TEMOVATE) 0.05 % Apply topically.     dexamethasone (DECADRON) 4 MG tablet Take 10 tablets (40 mg total) by mouth once a week. 40 tablet 5   flecainide (TAMBOCOR) 50 MG tablet TAKE ONE AND ONE-HALF TABLETS BY MOUTH TWICE A DAY 270 tablet 3   fluticasone (FLONASE) 50 MCG/ACT nasal spray Place 1 spray into both nostrils daily as needed for allergies or rhinitis.     folic acid (FOLVITE) 1 MG tablet Take 1 mg by mouth daily.     gabapentin (NEURONTIN) 300 MG capsule TAKE TWO CAPSULES BY MOUTH TWICE A DAY 360 capsule 0   hydrALAZINE (APRESOLINE) 10 MG tablet Take 1 tablet (10 mg total) by mouth 2 (two) times daily. AS NEEDED FOR HIGH BP 180 tablet 3   hydrochlorothiazide (HYDRODIURIL) 25 MG tablet TAKE ONE TABLET BY MOUTH ONE TIME DAILY 90 tablet 3   lenalidomide (REVLIMID) 25 MG capsule Take 1 capsule (25 mg total) by mouth daily. Take 21 days on, 7 days off, repeat every 28 days. [employer] auth # [id number] Date obtained: 12/25/21 21 capsule 0   lisinopril  (ZESTRIL) 40 MG tablet TAKE ONE TABLET BY MOUTH ONE TIME DAILY 90 tablet 3   metoprolol succinate (TOPROL-XL) 50 MG 24 hr tablet Take 0.5 tablets (25 mg total) by mouth daily. 90 tablet 3   oxymetazoline (AFRIN) 0.05 % nasal spray Place 1 spray into both nostrils 2 (two) times daily as needed for congestion.     potassium chloride SA (KLOR-CON M) 20 MEQ tablet TAKE ONE TABLET BY MOUTH ONE TIME DAILY, TAKE 40 MG (2 TABLETS) ON DAYS THAT YOU ALSO TAKE LASIX (FUROSEMIDE) 135 tablet 3   prochlorperazine (COMPAZINE) 10 MG tablet Take 1 tablet (10 mg total) by mouth every 6 (six) hours as needed for nausea or vomiting. 30 tablet 0   psyllium (METAMUCIL) 58.6 % packet Take 1 packet by mouth daily.     sodium chloride (OCEAN) 0.65 % SOLN nasal spray Place 1 spray into both nostrils as needed for congestion.     triamcinolone cream (KENALOG) 0.1 % Apply 1 application topically daily as needed (itching).     furosemide (LASIX) 40 MG tablet Take 1 tablet (40 mg) by mouth every other day alternating with 1 and 1/2 tablets (60 mg) on the opposite days. (Patient not taking: Reported on 01/05/2022) 113 tablet 3   methocarbamol (ROBAXIN) 500 MG tablet Take 1 tablet (500 mg total) by mouth every 8 (eight) hours as needed for muscle spasms. (Patient not taking: Reported on 01/05/2022) 30 tablet 0   No current facility-administered medications for this visit.    REVIEW OF SYSTEMS:   Constitutional: ( - ) fevers, ( - )  chills , ( - ) night sweats Eyes: ( - ) blurriness of vision, ( - ) double vision, ( - ) watery eyes Ears, nose, mouth, throat, and face: ( - ) mucositis, ( - ) sore throat Respiratory: ( - ) cough, ( - ) dyspnea, ( - ) wheezes Cardiovascular: ( - ) palpitation, ( - ) chest discomfort, ( - ) lower extremity swelling Gastrointestinal:  ( - ) nausea, ( - ) heartburn, ( - ) change in bowel habits Skin: ( - ) abnormal skin rashes Lymphatics: ( - ) new lymphadenopathy, ( - ) easy  bruising Neurological: ( -  ) numbness, ( - ) tingling, ( - ) new weaknesses Behavioral/Psych: ( - ) mood change, ( - ) new changes  All other systems were reviewed with the patient and are negative.  PHYSICAL EXAMINATION: ECOG PERFORMANCE STATUS: 1 - Symptomatic but completely ambulatory  Vitals:   01/05/22 0912  BP: (!) 148/59  Pulse: 63  Resp: 18  Temp: 98.3 F (36.8 C)  SpO2: 98%   Filed Weights   01/05/22 0912  Weight: 254 lb 4.8 oz (115.3 kg)    GENERAL: Well-appearing elderly Caucasian male, alert, no distress and comfortable SKIN: skin color, texture, turgor are normal, no rashes or significant lesions EYES: conjunctiva are pink and non-injected, sclera clear LUNGS: clear to auscultation and percussion with normal breathing effort HEART: regular rate & rhythm and no murmurs and no lower extremity edema Musculoskeletal: no cyanosis of digits and no clubbing  PSYCH: alert & oriented x 3, fluent speech NEURO: no focal motor/sensory deficits  LABORATORY DATA:  I have reviewed the data as listed    Latest Ref Rng & Units 01/05/2022    8:46 AM 12/28/2021    9:41 AM 12/24/2021    9:45 AM  CBC  WBC 4.0 - 10.5 K/uL 8.1  8.2  8.4   Hemoglobin 13.0 - 17.0 g/dL 14.3  14.0  14.2   Hematocrit 39.0 - 52.0 % 40.6  39.4  40.3   Platelets 150 - 400 K/uL 257  280  232        Latest Ref Rng & Units 01/05/2022    8:46 AM 12/24/2021    9:45 AM 12/07/2021    3:39 PM  CMP  Glucose 70 - 99 mg/dL 111  98  109   BUN 8 - 23 mg/dL '15  17  19   ' Creatinine 0.61 - 1.24 mg/dL 0.79  0.70  0.83   Sodium 135 - 145 mmol/L 140  139  140   Potassium 3.5 - 5.1 mmol/L 3.5  3.4  3.4   Chloride 98 - 111 mmol/L 102  101  103   CO2 22 - 32 mmol/L 31  34  28   Calcium 8.9 - 10.3 mg/dL 9.8  10.2  9.7   Total Protein 6.5 - 8.1 g/dL 6.4  6.8  7.2   Total Bilirubin 0.3 - 1.2 mg/dL 0.5  0.5  0.4   Alkaline Phos 38 - 126 U/L 135  114  103   AST 15 - 41 U/L '16  21  20   ' ALT 0 - 44 U/L '26  25  26     ' Lab Results  Component Value  Date   MPROTEIN 0.2 (H) 12/07/2021   MPROTEIN 0.3 (H) 04/10/2021   MPROTEIN 0.3 (H) 05/05/2020   Lab Results  Component Value Date   KPAFRELGTCHN 1,971.3 (H) 12/07/2021   KPAFRELGTCHN 580.5 (H) 04/10/2021   KPAFRELGTCHN 598.0 (H) 05/05/2020   LAMBDASER 5.5 (L) 12/07/2021   LAMBDASER 7.0 04/10/2021   LAMBDASER 8.2 05/05/2020   KAPLAMBRATIO 2,223.53 (H) 12/28/2021   KAPLAMBRATIO 358.42 (H) 12/07/2021   KAPLAMBRATIO 82.93 (H) 04/10/2021     RADIOGRAPHIC STUDIES: DG Humerus Right  Result Date: 12/19/2021 CLINICAL DATA:  Postop images of the right humerus. History of myeloma. EXAM: RIGHT HUMERUS - 2+ VIEW COMPARISON:  12/15/2021 FINDINGS: Intramedullary nail with 2 proximal interlocking screws in the humeral head and a distal interlocking screw in the distal diaphysis. Again noted is a destructive osseous lesion in the mid  humerus compatible with a pathologic fracture. Small lucent line in the mid/distal diaphysis region could represent a nutrient canal but difficult to exclude nondisplaced fracture at this location. Right shoulder appears located on these views. Lucency in the soft tissues compatible with recent surgery. Alignment of the pathologic fracture in the mid humerus is near anatomic. IMPRESSION: 1. Internal fixation of the pathologic fracture in the mid humerus. 2. Linear lucency in the mid/distal humerus diaphysis may be new and could represent a nondisplaced fracture in this area. 3. These results will be called to the ordering clinician or representative by the Radiologist Assistant, and communication documented in the PACS or [employer]. Electronically Signed   By: Markus Daft M.D.   On: 12/19/2021 09:22   DG Humerus Right  Result Date: 12/19/2021 CLINICAL DATA:  71 year old male with osseous metastatic disease and pathologic fracture EXAM: RIGHT HUMERUS - 2+ VIEW COMPARISON:  Osseous survey 12/15/2021 FINDINGS: Two views of the right humerus demonstrate interval operative  repair with intramedullary nail including 2 proximal in a single distal interlocking screw. The nail traverses and irregular permeative lucency in the mid humeral diaphysis. Lucency extending through the cortex consistent with pathologic fracture. No evidence of immediate hardware complication. IMPRESSION: ORIF of presumed pathologic fracture in the mid humeral diaphysis without evidence of immediate hardware complication. Electronically Signed   By: Jacqulynn Cadet M.D.   On: 12/19/2021 07:07   DG C-Arm 1-60 Min-No Report  Result Date: 12/17/2021 Fluoroscopy was utilized by the requesting physician.  No radiographic interpretation.   DG Bone Survey Met  Result Date: 12/16/2021 CLINICAL DATA:  Multiple myeloma EXAM: METASTATIC BONE SURVEY COMPARISON:  11/10/2017, 12/01/2021, MRI from 12/04/2021 FINDINGS: Lateral view of the skull demonstrates multiple lucency stable in appearance from the prior exam. There is a new 14 mm lucency identified near the vertex likely representing a myelomatous lesion. Additionally there is a lucency identified posteriorly incompletely evaluated on this view suspicious for myelomatous focus. Upper extremities demonstrate a lytic permeative lesion in the midshaft of the right humerus similar to that seen on recent MRI examination. Underlying pathologic fracture is seen. This was present on recent plain film from 12/01/2021 but not well appreciated on the prior bone survey. A more proximal lytic lesion is noted in the region of the inferior aspect of the humeral head consistent with the MRI findings. Lytic lesion is noted in the distal aspect of the left humerus new from prior bone survey consistent with myelomatous lesion. A lucency is noted in the midshaft of the right radius consistent with myeloma lesion. Cervical spine demonstrates degenerative changes without compression deformity. Thoracic spine also demonstrates degenerative change without pedicle abnormality or paraspinal  mass. The lumbar spine demonstrates multilevel degenerative change. No compression deformities are seen. Ribcage shows no focal lytic lesion. Bilateral lower extremities show evidence of right hip replacement. No new lytic lesion is seen. Pelvis also shows no significant lytic lesion. IMPRESSION: Progressive lesions within the skull, right humerus, a distal left humerus and mid right radius are seen. These are consistent with progressive myeloma. Electronically Signed   By: Inez Catalina M.D.   On: 12/16/2021 22:31    ASSESSMENT & PLAN LE FAULCON 71 y.o. male with medical history significant for IgA Kappa Multiple Myeloma who presents for a follow up visit.   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  He meets diagnostic criteria by having a  serum free light chain ratio greater than 100 and lytic lesions causing pathological fracture of the humerus.  Given this I would recommend we pursued Darzalex, Revlimid, and dexamethasone.  Given his advanced age I would not recommend transplantation, that this is something we can consider when it comes time to transition to maintenance therapy versus transplant.  The patient voices understanding of the plan moving forward.  # IgA Kappa Multiple Myeloma --Metastatic survey completed 12/15/2021 --Patient underwent fixation of his humerus on 12/17/2021, pathology confirmed plasmacytoma/plasma cell neoplasm.  --Cycle 1 Day 1 of  Darzalex, Revlimid, and dexamethasone started on 12/28/2021.  Plan: --labs today show white blood cell 8.1, hemoglobin 14.3, MCV 90.6, and platelets of 257 --today is Cycle 1 Day 8 of Dara/Dex --RTC in 2 weeks with interval weekly treatments   #Supportive Care -- chemotherapy education complete -- port placement not required -- zofran 75m q8H PRN and compazine 141mPO q6H for nausea -- acyclovir 40021mO BID for VCZ prophylaxis --  Zometa/Xgeva treatment to start once dental clearance is obtained. -- Pain is managed right now with tylenol  Offered stronger pain medication if pain were to worsen.    Orders Placed This Encounter  Procedures   Multiple Myeloma Panel (SPEP&IFE w/QIG)    Standing Status:   Future    Standing Expiration Date:   01/26/2023   Kappa/lambda light chains    Standing Status:   Future    Standing Expiration Date:   01/26/2023   CMP ([facility])    Standing Status:   Future    Standing Expiration Date:   01/27/2023   Lactate dehydrogenase (LDH)    Standing Status:   Future    Standing Expiration Date:   01/26/2023   CBC with Differential ([facility]    Standing Status:   Future    Standing Expiration Date:   01/27/2023   CMP ([facility])    Standing Status:   Future    Standing Expiration Date:   02/03/2023   Lactate dehydrogenase (LDH)    Standing Status:   Future    Standing Expiration Date:   02/02/2023   CBC with Differential ([facility])    Standing Status:   Future    Standing Expiration Date:   02/03/2023   CMP ([facility])    Standing Status:   Future    Standing Expiration Date:   02/10/2023   Lactate dehydrogenase (LDH)    Standing Status:   Future    Standing Expiration Date:   02/09/2023   CBC with Differential ([facility])    Standing Status:   Future    Standing Expiration Date:   02/10/2023   CMP ([facility])    Standing Status:   Future    Standing Expiration Date:   02/17/2023   Lactate dehydrogenase (LDH)    Standing Status:   Future    Standing Expiration Date:   02/16/2023   CBC with Differential ([facility])    Standing Status:   Future    Standing Expiration Date:   02/17/2023    All questions were answered. The patient knows to call the clinic with any problems, questions or concerns.  A total of more than 30 minutes were spent on this encounter with face-to-face time and non-face-to-face time,  including preparing to see the patient, ordering tests and/or medications, counseling the patient and coordination of care as outlined above.   JohLedell PeoplesD  Department of Hematology/Oncology [place] at [facility] Phone: [phone] Pager: [phone] Email: [email or web address]  01/05/2022 1:55 PM

## 2022-01-06 ENCOUNTER — Ambulatory Visit
Admission: RE | Admit: 2022-01-06 | Discharge: 2022-01-06 | Disposition: A | Discharge status: Home / Self Care | Payer: Medicare Other | Source: Ambulatory Visit | Attending: Radiation Oncology | Admitting: Radiation Oncology

## 2022-01-06 ENCOUNTER — Other Ambulatory Visit: Payer: Self-pay

## 2022-01-06 DIAGNOSIS — C9000 Multiple myeloma not having achieved remission: Secondary | ICD-10-CM | POA: Diagnosis not present

## 2022-01-06 DIAGNOSIS — D472 Monoclonal gammopathy: Secondary | ICD-10-CM | POA: Diagnosis not present

## 2022-01-06 DIAGNOSIS — Z510 Encounter for antineoplastic radiation therapy: Secondary | ICD-10-CM | POA: Diagnosis not present

## 2022-01-06 LAB — KAPPA/LAMBDA LIGHT CHAINS
Kappa free light chain: 1438.6 mg/L — ABNORMAL HIGH (ref 3.3–19.4)
Kappa, lambda light chain ratio: 685.05 — ABNORMAL HIGH (ref 0.26–1.65)
Lambda free light chains: 2.1 mg/L — ABNORMAL LOW (ref 5.7–26.3)

## 2022-01-06 LAB — RAD ONC ARIA SESSION SUMMARY
Course Elapsed Days: 0
Plan Fractions Treated to Date: 1
Plan Fractions Treated to Date: 1
Plan Fractions Treated to Date: 1
Plan Prescribed Dose Per Fraction: 2 Gy
Plan Prescribed Dose Per Fraction: 2 Gy
Plan Prescribed Dose Per Fraction: 2 Gy
Plan Total Fractions Prescribed: 10
Plan Total Fractions Prescribed: 10
Plan Total Fractions Prescribed: 10
Plan Total Prescribed Dose: 20 Gy
Plan Total Prescribed Dose: 20 Gy
Plan Total Prescribed Dose: 20 Gy
Reference Point Dosage Given to Date: 2 Gy
Reference Point Dosage Given to Date: 2 Gy
Reference Point Dosage Given to Date: 2 Gy
Reference Point Session Dosage Given: 2 Gy
Reference Point Session Dosage Given: 2 Gy
Reference Point Session Dosage Given: 2 Gy
Session Number: 1

## 2022-01-07 ENCOUNTER — Ambulatory Visit
Admission: RE | Admit: 2022-01-07 | Discharge: 2022-01-07 | Disposition: A | Discharge status: Home / Self Care | Payer: Medicare Other | Source: Ambulatory Visit | Attending: Radiation Oncology | Admitting: Radiation Oncology

## 2022-01-07 ENCOUNTER — Other Ambulatory Visit: Payer: Self-pay

## 2022-01-07 DIAGNOSIS — C9000 Multiple myeloma not having achieved remission: Secondary | ICD-10-CM | POA: Diagnosis not present

## 2022-01-07 DIAGNOSIS — D472 Monoclonal gammopathy: Secondary | ICD-10-CM | POA: Diagnosis not present

## 2022-01-07 LAB — RAD ONC ARIA SESSION SUMMARY
Course Elapsed Days: 1
Plan Fractions Treated to Date: 2
Plan Fractions Treated to Date: 2
Plan Fractions Treated to Date: 2
Plan Prescribed Dose Per Fraction: 2 Gy
Plan Prescribed Dose Per Fraction: 2 Gy
Plan Prescribed Dose Per Fraction: 2 Gy
Plan Total Fractions Prescribed: 10
Plan Total Fractions Prescribed: 10
Plan Total Fractions Prescribed: 10
Plan Total Prescribed Dose: 20 Gy
Plan Total Prescribed Dose: 20 Gy
Plan Total Prescribed Dose: 20 Gy
Reference Point Dosage Given to Date: 4 Gy
Reference Point Dosage Given to Date: 4 Gy
Reference Point Dosage Given to Date: 4 Gy
Reference Point Session Dosage Given: 2 Gy
Reference Point Session Dosage Given: 2 Gy
Reference Point Session Dosage Given: 2 Gy
Session Number: 2

## 2022-01-08 ENCOUNTER — Ambulatory Visit
Admission: RE | Admit: 2022-01-08 | Discharge: 2022-01-08 | Disposition: A | Discharge status: Home / Self Care | Payer: Medicare Other | Source: Ambulatory Visit | Attending: Radiation Oncology | Admitting: Radiation Oncology

## 2022-01-08 ENCOUNTER — Other Ambulatory Visit: Payer: Self-pay

## 2022-01-08 DIAGNOSIS — C9000 Multiple myeloma not having achieved remission: Secondary | ICD-10-CM | POA: Diagnosis not present

## 2022-01-08 DIAGNOSIS — D472 Monoclonal gammopathy: Secondary | ICD-10-CM | POA: Diagnosis not present

## 2022-01-08 LAB — RAD ONC ARIA SESSION SUMMARY
Course Elapsed Days: 2
Plan Fractions Treated to Date: 3
Plan Fractions Treated to Date: 3
Plan Fractions Treated to Date: 3
Plan Prescribed Dose Per Fraction: 2 Gy
Plan Prescribed Dose Per Fraction: 2 Gy
Plan Prescribed Dose Per Fraction: 2 Gy
Plan Total Fractions Prescribed: 10
Plan Total Fractions Prescribed: 10
Plan Total Fractions Prescribed: 10
Plan Total Prescribed Dose: 20 Gy
Plan Total Prescribed Dose: 20 Gy
Plan Total Prescribed Dose: 20 Gy
Reference Point Dosage Given to Date: 6 Gy
Reference Point Dosage Given to Date: 6 Gy
Reference Point Dosage Given to Date: 6 Gy
Reference Point Session Dosage Given: 2 Gy
Reference Point Session Dosage Given: 2 Gy
Reference Point Session Dosage Given: 2 Gy
Session Number: 3

## 2022-01-11 ENCOUNTER — Other Ambulatory Visit: Payer: Self-pay

## 2022-01-11 ENCOUNTER — Ambulatory Visit (HOSPITAL_COMMUNITY)
Admission: RE | Admit: 2022-01-11 | Discharge: 2022-01-11 | Disposition: A | Discharge status: Home / Self Care | Payer: Medicare Other | Source: Ambulatory Visit | Attending: Physician Assistant | Admitting: Physician Assistant

## 2022-01-11 ENCOUNTER — Ambulatory Visit
Admission: RE | Admit: 2022-01-11 | Discharge: 2022-01-11 | Disposition: A | Discharge status: Home / Self Care | Payer: Medicare Other | Source: Ambulatory Visit | Attending: Radiation Oncology | Admitting: Radiation Oncology

## 2022-01-11 ENCOUNTER — Telehealth: Payer: Self-pay

## 2022-01-11 ENCOUNTER — Telehealth: Payer: Self-pay | Admitting: Internal Medicine

## 2022-01-11 VITALS — BP 138/66 | HR 98 | Ht 70.0 in | Wt 251.8 lb

## 2022-01-11 DIAGNOSIS — G4733 Obstructive sleep apnea (adult) (pediatric): Secondary | ICD-10-CM | POA: Diagnosis not present

## 2022-01-11 DIAGNOSIS — C9000 Multiple myeloma not having achieved remission: Secondary | ICD-10-CM | POA: Diagnosis not present

## 2022-01-11 DIAGNOSIS — Z86718 Personal history of other venous thrombosis and embolism: Secondary | ICD-10-CM | POA: Insufficient documentation

## 2022-01-11 DIAGNOSIS — I10 Essential (primary) hypertension: Secondary | ICD-10-CM | POA: Diagnosis not present

## 2022-01-11 DIAGNOSIS — Z79899 Other long term (current) drug therapy: Secondary | ICD-10-CM | POA: Diagnosis not present

## 2022-01-11 DIAGNOSIS — D472 Monoclonal gammopathy: Secondary | ICD-10-CM | POA: Diagnosis not present

## 2022-01-11 DIAGNOSIS — I484 Atypical atrial flutter: Secondary | ICD-10-CM | POA: Insufficient documentation

## 2022-01-11 DIAGNOSIS — E669 Obesity, unspecified: Secondary | ICD-10-CM | POA: Diagnosis not present

## 2022-01-11 DIAGNOSIS — Z7901 Long term (current) use of anticoagulants: Secondary | ICD-10-CM | POA: Diagnosis not present

## 2022-01-11 DIAGNOSIS — I4819 Other persistent atrial fibrillation: Secondary | ICD-10-CM | POA: Insufficient documentation

## 2022-01-11 DIAGNOSIS — D6869 Other thrombophilia: Secondary | ICD-10-CM | POA: Diagnosis not present

## 2022-01-11 DIAGNOSIS — Z6836 Body mass index (BMI) 36.0-36.9, adult: Secondary | ICD-10-CM | POA: Diagnosis not present

## 2022-01-11 LAB — RAD ONC ARIA SESSION SUMMARY
Course Elapsed Days: 5
Plan Fractions Treated to Date: 4
Plan Fractions Treated to Date: 4
Plan Fractions Treated to Date: 4
Plan Prescribed Dose Per Fraction: 2 Gy
Plan Prescribed Dose Per Fraction: 2 Gy
Plan Prescribed Dose Per Fraction: 2 Gy
Plan Total Fractions Prescribed: 10
Plan Total Fractions Prescribed: 10
Plan Total Fractions Prescribed: 10
Plan Total Prescribed Dose: 20 Gy
Plan Total Prescribed Dose: 20 Gy
Plan Total Prescribed Dose: 20 Gy
Reference Point Dosage Given to Date: 8 Gy
Reference Point Dosage Given to Date: 8 Gy
Reference Point Dosage Given to Date: 8 Gy
Reference Point Session Dosage Given: 2 Gy
Reference Point Session Dosage Given: 2 Gy
Reference Point Session Dosage Given: 2 Gy
Session Number: 4

## 2022-01-11 LAB — MULTIPLE MYELOMA PANEL, SERUM
Albumin SerPl Elph-Mcnc: 3.6 g/dL (ref 2.9–4.4)
Albumin/Glob SerPl: 1.6 (ref 0.7–1.7)
Alpha 1: 0.2 g/dL (ref 0.0–0.4)
Alpha2 Glob SerPl Elph-Mcnc: 0.8 g/dL (ref 0.4–1.0)
B-Globulin SerPl Elph-Mcnc: 1 g/dL (ref 0.7–1.3)
Gamma Glob SerPl Elph-Mcnc: 0.3 g/dL — ABNORMAL LOW (ref 0.4–1.8)
Globulin, Total: 2.3 g/dL (ref 2.2–3.9)
IgA: 159 mg/dL (ref 61–437)
IgG (Immunoglobin G), Serum: 333 mg/dL — ABNORMAL LOW (ref 603–1613)
IgM (Immunoglobulin M), Srm: 9 mg/dL — ABNORMAL LOW (ref 20–172)
M Protein SerPl Elph-Mcnc: 0.1 g/dL — ABNORMAL HIGH
Total Protein ELP: 5.9 g/dL — ABNORMAL LOW (ref 6.0–8.5)

## 2022-01-11 NOTE — Telephone Encounter (Signed)
error 

## 2022-01-11 NOTE — Telephone Encounter (Signed)
Patient texted/called on Saturday 01/09/22 Michela Pitcher he was back in afib   Rates OK  BP OK      Had one glass of wine the night before      REcomm :   he is on 75 mg bid Flecanide    I recomm he take 50 mg at 10 and then 50 mg at 1 PM if still in afib    Sunday-   Still in Afib   Recomm he increase flecanide to 100 bid    Will arrange for appt with me or in afib clinic to set up for cardioversion Note:   He is gettting XRT for multiple myeloma sites   Between 12 and 1 daily

## 2022-01-11 NOTE — Patient Instructions (Signed)
Continue metoprolol at '50mg'$  daily and flecainide at '100mg'$  twice a day until day before cardioversion then reduce back normal dosing   Cardioversion scheduled for Wednesday, September 20th  - Arrive at the [facility] and go to admitting at 830am  - Do not eat or drink anything after midnight the night prior to your procedure.  - Take all your morning medication (except diabetic medications) with a sip of water prior to arrival.  - You will not be able to drive home after your procedure.  - Do NOT miss any doses of your blood thinner - if you should miss a dose please notify our office immediately.  - If you feel as if you go back into normal rhythm prior to scheduled cardioversion, please notify our office immediately. If your procedure is canceled in the cardioversion suite you will be charged a cancellation fee.

## 2022-01-11 NOTE — Telephone Encounter (Signed)
Pt to see Adline Peals PA in the Afib clinic today at 10:30 am.

## 2022-01-11 NOTE — Progress Notes (Signed)
Primary Care Physician: Lajean Manes, MD Primary Cardiologist: Dr Harrington Challenger Primary Electrophysiologist: Dr Rayann Heman (remotely) Referring Physician: Dr [name] is a 71 y.o. male with a history of HTN, OSA, prior DVT, MGUS, atrial fibrillation who presents for follow up in the [facility].  The patient underwent afib ablation in 2018 with Dr Rayann Heman and has been maintained on flecainide. Patient is on Eliquis for a CHADS2VASC score of 2. Patient called [facility] 01/09/22 reporting that he was back in afib which had started the evening prior. His flecainide was increased. He is in atrial flutter today. Patient had just started radiation treatment for his multiple myeloma.   Today, he denies symptoms of chest pain, shortness of breath, orthopnea, PND, lower extremity edema, dizziness, presyncope, syncope, bleeding, or neurologic sequela. The patient is tolerating medications without difficulties and is otherwise without complaint today.    Atrial Fibrillation Risk Factors:  he does have symptoms or diagnosis of sleep apnea. he is compliant with CPAP therapy. he does not have a history of rheumatic fever. he does have a history of alcohol use.   he has a BMI of Body mass index is 36.13 kg/m.[name] Filed Weights   01/11/22 1042  Weight: 114.2 kg    Family History  Problem Relation Age of Onset   Dementia Mother    Stroke Father    Atrial fibrillation Father    Hypertension Father    Hypertension Paternal Grandfather    Dementia Maternal Grandmother      Atrial Fibrillation Management history:  Previous antiarrhythmic drugs: flecainide  Previous cardioversions: none Previous ablations: 2018 CHADS2VASC score: 2 Anticoagulation history: Eliquis   Past Medical History:  Diagnosis Date   Arthritis    "comes and goes; mostly in hands, occasionally elbow" (54/01/8263)   Complication of anesthesia    "just wanted to sleep alot  for couple  days S/P VARICOCELE OR" (04/05/2017)   DVT (deep venous thrombosis) (HCC)    LLE   Dysrhythmia    PVCs    GERD (gastroesophageal reflux disease)    History of hiatal hernia    Hypertension    Impaired glucose tolerance    Multiple myeloma (HCC)    Obesity (BMI 30-39.9) 07/26/2016   OSA on CPAP 07/26/2016   Mild with AHI 12.5/hr now on CPAP at 14cm H2O   PAF (paroxysmal atrial fibrillation) ([facility])    s/p PVI Ablation in 2018 // Flecainide Rx // Echo 8/21: EF 60-65, no RWMA, mild LVH, normal RV SF, moderate RVE, mild BAE, trivial MR, mild dilation of aortic root (40 mm)   Pneumonia    "may have had walking pneumonia a few years ago" (04/05/2017)   Pre-diabetes    Thrombophlebitis    Past Surgical History:  Procedure Laterality Date   ATRIAL FIBRILLATION ABLATION  04/05/2017   ATRIAL FIBRILLATION ABLATION N/A 04/05/2017   Procedure: ATRIAL FIBRILLATION ABLATION;  Surgeon: Thompson Grayer, MD;  Location: [facility];  Service: Cardiovascular;  Laterality: N/A;   BONE BIOPSY Right 12/17/2021   Procedure: BONE BIOPSY;  Surgeon: Hiram Gash, MD;  Location: [facility];  Service: Orthopedics;  Laterality: Right;   COMPLETE RIGHT HIP REPLACMENT  01/19/2019   EVALUATION UNDER ANESTHESIA WITH HEMORRHOIDECTOMY N/A 02/24/2017   Procedure: EXAM UNDER ANESTHESIA WITH  HEMORRHOIDECTOMY;  Surgeon: Michael Boston, MD;  Location: [facility];  Service: General;  Laterality: N/A;   EXCISIONAL TOTAL HIP ARTHROPLASTY WITH ANTIBIOTIC SPACERS Right 09/25/2020  Procedure: EXCISIONAL TOTAL HIP ARTHROPLASTY WITH ANTIBIOTIC SPACERS;  Surgeon: Paralee Cancel, MD;  Location: [facility];  Service: Orthopedics;  Laterality: Right;   FLEXIBLE SIGMOIDOSCOPY N/A 04/15/2015   Procedure:  UNSEDATED FLEXIBLE SIGMOIDOSCOPY;  Surgeon: [name], MD;  Location: [facility];  Service: Endoscopy;  Laterality: N/A;   HUMERUS IM NAIL Right 12/17/2021   Procedure: INTRAMEDULLARY (IM) NAIL HUMERAL;  Surgeon: Hiram Gash, MD;  Location: [facility];  Service: Orthopedics;  Laterality: Right;   KNEE ARTHROSCOPY Left ~ 2016   REIMPLANTATION OF TOTAL HIP Right 12/25/2020   Procedure: REIMPLANTATION/REVISION OF RIGHT TOTAL HIP VERSUS REPEAT IRRIGATION AND DEBRIDEMENT;  Surgeon: Paralee Cancel, MD;  Location: [facility];  Service: Orthopedics;  Laterality: Right;   TESTICLE SURGERY  1988   VARICOCELE   TONSILLECTOMY     VARICOSE VEIN SURGERY Bilateral     Current Outpatient Medications  Medication Sig Dispense Refill   Acetaminophen (TYLENOL EXTRA STRENGTH PO) Take 2 tablets by mouth as needed (For pain).     acyclovir (ZOVIRAX) 400 MG tablet Take 1 tablet (400 mg total) by mouth 2 (two) times daily. 180 tablet 3   amLODipine (NORVASC) 10 MG tablet Take 10 mg by mouth daily.      apixaban (ELIQUIS) 5 MG TABS tablet TAKE ONE TABLET BY MOUTH TWICE A DAY 60 tablet 6   Ascorbic Acid (VITAMIN C) 100 MG tablet Take 100 mg by mouth daily.     cholecalciferol (VITAMIN D3) 25 MCG (1000 UNIT) tablet Take 1,000 Units by mouth daily.     clobetasol cream (TEMOVATE) 3.41 % Apply 1 Application topically as needed.     Cyanocobalamin (VITAMIN B-12 PO) Take 1 tablet by mouth daily. Not sure the dosage     dexamethasone (DECADRON) 4 MG tablet Take 10 tablets (40 mg total) by mouth once a week. 40 tablet 5   Emollient (UDDERLY SMOOTH) CREA Apply topically daily.     flecainide (TAMBOCOR) 50 MG tablet TAKE ONE AND ONE-HALF TABLETS BY MOUTH TWICE A DAY 270 tablet 3   fluticasone (FLONASE) 50 MCG/ACT nasal spray Place 1 spray into both nostrils daily as needed for allergies or rhinitis.     folic acid (FOLVITE) 1 MG tablet Take 1 mg by mouth daily.     gabapentin (NEURONTIN) 300 MG capsule TAKE TWO CAPSULES BY MOUTH TWICE A DAY 360 capsule 0   hydrALAZINE (APRESOLINE) 10 MG tablet Take 1 tablet (10 mg total) by mouth 2 (two) times daily. AS NEEDED FOR HIGH BP 180 tablet 3   hydrochlorothiazide (HYDRODIURIL) 25 MG  tablet TAKE ONE TABLET BY MOUTH ONE TIME DAILY 90 tablet 3   lenalidomide (REVLIMID) 25 MG capsule Take 1 capsule (25 mg total) by mouth daily. Take 21 days on, 7 days off, repeat every 28 days. [employer] auth # [id number] Date obtained: 12/25/21 21 capsule 0   lisinopril (ZESTRIL) 40 MG tablet TAKE ONE TABLET BY MOUTH ONE TIME DAILY 90 tablet 3   metoprolol succinate (TOPROL-XL) 50 MG 24 hr tablet Take 0.5 tablets (25 mg total) by mouth daily. 90 tablet 3   oxymetazoline (AFRIN) 0.05 % nasal spray Place 1 spray into both nostrils 2 (two) times daily as needed for congestion.     potassium chloride SA (KLOR-CON M) 20 MEQ tablet TAKE ONE TABLET BY MOUTH ONE TIME DAILY, TAKE 40 MG (2 TABLETS) ON DAYS THAT YOU ALSO TAKE LASIX (FUROSEMIDE) 135 tablet 3   prochlorperazine (COMPAZINE) 10 MG tablet  Take 1 tablet (10 mg total) by mouth every 6 (six) hours as needed for nausea or vomiting. 30 tablet 0   psyllium (METAMUCIL) 58.6 % packet Take 1 packet by mouth as needed.     sodium chloride (OCEAN) 0.65 % SOLN nasal spray Place 1 spray into both nostrils as needed for congestion.     triamcinolone cream (KENALOG) 0.1 % Apply 1 application topically daily as needed (itching).     furosemide (LASIX) 40 MG tablet Take 1 tablet (40 mg) by mouth every other day alternating with 1 and 1/2 tablets (60 mg) on the opposite days. (Patient not taking: Reported on 01/11/2022) 113 tablet 3   No current facility-administered medications for this encounter.    Allergies  Allergen Reactions   Linezolid Other (See Comments)    Burning tongue    Vancomycin Rash    Social History   Socioeconomic History   Marital status: Married    Spouse name: Not on file   Number of children: 1   Years of education: law school   Highest education level: Not on file  Occupational History   Occupation: lawyer  Tobacco Use   Smoking status: Never   Smokeless tobacco: Never  Vaping Use   Vaping Use: Never used  Substance and Sexual  Activity   Alcohol use: Yes    Alcohol/week: 2.0 standard drinks of alcohol    Types: 2 Glasses of wine per week    Comment: 2 glasses of wine a week, occ bourbon, beer   Drug use: No   Sexual activity: Not Currently  Other Topics Concern   Not on file  Social History Narrative   Lives in [place] with spouse in a 2 story home.  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    Social Determinants of Health   Financial Resource Strain: Not on file  Food Insecurity: Not on file  Transportation Needs: Not on file  Physical Activity: Not on file  Stress: Not on file  Social Connections: Not on file  Intimate Partner Violence: Not on file     ROS- All systems are reviewed and negative except as per the HPI above.  Physical Exam: Vitals:   01/11/22 1042  BP: 138/66  Pulse: 98  Weight: 114.2 kg  Height: '5\' 10"'  (1.778 m)    GEN- The patient is a well appearing obese male, alert and oriented x 3 today.   Head- normocephalic, atraumatic Eyes-  Sclera clear, conjunctiva pink Ears- hearing intact Oropharynx- clear Neck- supple  Lungs- Clear to ausculation bilaterally, normal work of breathing Heart- irregular rate and rhythm, no murmurs, rubs or gallops  GI- soft, NT, ND, + BS Extremities- no clubbing, cyanosis, or edema, R arm in sling MS- no significant deformity or atrophy Skin- no rash or lesion Psych- euthymic mood, full affect Neuro- strength and sensation are intact  Wt Readings from Last 3 Encounters:  01/11/22 114.2 kg  01/05/22 115.3 kg  12/28/21 115.4 kg    EKG today demonstrates  Atypical atrial flutter with variable block Vent. rate 98 BPM PR interval * ms QRS duration 94 ms QT/QTcB 376/480 ms  Echo 12/02/21 demonstrated  1. Global longitudinal strain is -20.7%. Left ventricular ejection  fraction, by estimation, is 60 to 65%. The left ventricle has normal  function. The left ventricle has no  regional wall motion abnormalities.  Left ventricular diastolic parameters were normal.  2. Right ventricular systolic function is normal. The right ventricular  size is normal.   3. The mitral valve is normal in structure. Mild mitral valve  regurgitation.   4. The aortic valve is normal in structure. Aortic valve regurgitation is not visualized.   5. The inferior vena cava is normal in size with greater than 50%  respiratory variability, suggesting right atrial pressure of 3 mmHg.   Epic records are reviewed at length today  CHA2DS2-VASc Score = 2  The patient's score is based upon: CHF History: 0 HTN History: 1 Diabetes History: 0 Stroke History: 0 Vascular Disease History: 0 Age Score: 1 Gender Score: 0       ASSESSMENT AND PLAN: 1. Persistent Atrial Fibrillation/atrial flutter The patient's CHA2DS2-VASc score is 2, indicating a 2.2% annual risk of stroke.   Patient appears to be persistent in atrial flutter. We discussed rhythm control options. Will plan for DCCV.  Continue flecainide 100 mg BID until DCCV, then decrease back to his usual 75 mg dose.  Increase Toprol to 50 mg daily until DCCV.  Continue Eliquis 5 mg BID  2. Secondary Hypercoagulable State (ICD10:  D68.69) The patient is at significant risk for stroke/thromboembolism based upon his CHA2DS2-VASc Score of 2.  Continue Apixaban (Eliquis).   3. Obesity Body mass index is 36.13 kg/m. Lifestyle modification was discussed at length including regular exercise and weight reduction.  4. Obstructive sleep apnea Encouraged compliance with CPAP therapy.  5. HTN Stable, med changes as above.   6. Multiple Myeloma Followed by Dr Lorenso Courier   Follow up with Leanor Kail PA as scheduled post DCCV.    [facility] [address] [place], [place] [address] [phone] 01/11/2022 11:14 AM

## 2022-01-12 ENCOUNTER — Other Ambulatory Visit: Payer: Self-pay

## 2022-01-12 ENCOUNTER — Ambulatory Visit
Admission: RE | Admit: 2022-01-12 | Discharge: 2022-01-12 | Disposition: A | Discharge status: Home / Self Care | Payer: Medicare Other | Source: Ambulatory Visit | Attending: Radiation Oncology | Admitting: Radiation Oncology

## 2022-01-12 ENCOUNTER — Inpatient Hospital Stay: Payer: Medicare Other

## 2022-01-12 ENCOUNTER — Ambulatory Visit: Payer: Medicare Other | Admitting: Hematology and Oncology

## 2022-01-12 VITALS — BP 110/66 | HR 69 | Temp 98.5°F | Resp 16 | Ht 70.0 in | Wt 253.0 lb

## 2022-01-12 DIAGNOSIS — C9000 Multiple myeloma not having achieved remission: Secondary | ICD-10-CM

## 2022-01-12 DIAGNOSIS — D472 Monoclonal gammopathy: Secondary | ICD-10-CM | POA: Diagnosis not present

## 2022-01-12 DIAGNOSIS — Z510 Encounter for antineoplastic radiation therapy: Secondary | ICD-10-CM | POA: Diagnosis not present

## 2022-01-12 LAB — LACTATE DEHYDROGENASE: LDH: 118 U/L (ref 98–192)

## 2022-01-12 LAB — CBC WITH DIFFERENTIAL (CANCER CENTER ONLY)
Abs Immature Granulocytes: 0.03 10*3/uL (ref 0.00–0.07)
Basophils Absolute: 0 10*3/uL (ref 0.0–0.1)
Basophils Relative: 0 %
Eosinophils Absolute: 0.1 10*3/uL (ref 0.0–0.5)
Eosinophils Relative: 2 %
HCT: 42 % (ref 39.0–52.0)
Hemoglobin: 14.5 g/dL (ref 13.0–17.0)
Immature Granulocytes: 1 %
Lymphocytes Relative: 7 %
Lymphs Abs: 0.4 10*3/uL — ABNORMAL LOW (ref 0.7–4.0)
MCH: 31.8 pg (ref 26.0–34.0)
MCHC: 34.5 g/dL (ref 30.0–36.0)
MCV: 92.1 fL (ref 80.0–100.0)
Monocytes Absolute: 0.7 10*3/uL (ref 0.1–1.0)
Monocytes Relative: 12 %
Neutro Abs: 4.7 10*3/uL (ref 1.7–7.7)
Neutrophils Relative %: 78 %
Platelet Count: 170 10*3/uL (ref 150–400)
RBC: 4.56 MIL/uL (ref 4.22–5.81)
RDW: 13.3 % (ref 11.5–15.5)
WBC Count: 6 10*3/uL (ref 4.0–10.5)
nRBC: 0 % (ref 0.0–0.2)

## 2022-01-12 LAB — CMP (CANCER CENTER ONLY)
ALT: 24 U/L (ref 0–44)
AST: 15 U/L (ref 15–41)
Albumin: 3.8 g/dL (ref 3.5–5.0)
Alkaline Phosphatase: 111 U/L (ref 38–126)
Anion gap: 7 (ref 5–15)
BUN: 14 mg/dL (ref 8–23)
CO2: 30 mmol/L (ref 22–32)
Calcium: 9.2 mg/dL (ref 8.9–10.3)
Chloride: 105 mmol/L (ref 98–111)
Creatinine: 0.9 mg/dL (ref 0.61–1.24)
GFR, Estimated: >60 mL/min (ref >60)
Glucose, Bld: 127 mg/dL — ABNORMAL HIGH (ref 70–99)
Potassium: 3.4 mmol/L — ABNORMAL LOW (ref 3.5–5.1)
Sodium: 142 mmol/L (ref 135–145)
Total Bilirubin: 0.6 mg/dL (ref 0.3–1.2)
Total Protein: 5.8 g/dL — ABNORMAL LOW (ref 6.5–8.1)

## 2022-01-12 LAB — RAD ONC ARIA SESSION SUMMARY
Course Elapsed Days: 6
Plan Fractions Treated to Date: 5
Plan Fractions Treated to Date: 5
Plan Fractions Treated to Date: 5
Plan Prescribed Dose Per Fraction: 2 Gy
Plan Prescribed Dose Per Fraction: 2 Gy
Plan Prescribed Dose Per Fraction: 2 Gy
Plan Total Fractions Prescribed: 10
Plan Total Fractions Prescribed: 10
Plan Total Fractions Prescribed: 10
Plan Total Prescribed Dose: 20 Gy
Plan Total Prescribed Dose: 20 Gy
Plan Total Prescribed Dose: 20 Gy
Reference Point Dosage Given to Date: 10 Gy
Reference Point Dosage Given to Date: 10 Gy
Reference Point Dosage Given to Date: 10 Gy
Reference Point Session Dosage Given: 2 Gy
Reference Point Session Dosage Given: 2 Gy
Reference Point Session Dosage Given: 2 Gy
Session Number: 5

## 2022-01-12 MED ORDER — ACETAMINOPHEN 325 MG PO TABS
650.0000 mg | ORAL_TABLET | Freq: Once | ORAL | Status: AC
  Administered 2022-01-12: 650 mg via ORAL
  Filled 2022-01-12: qty 2

## 2022-01-12 MED ORDER — DARATUMUMAB-HYALURONIDASE-FIHJ 1800-30000 MG-UT/15ML ~~LOC~~ SOLN
1800.0000 mg | Freq: Once | SUBCUTANEOUS | Status: AC
  Administered 2022-01-12: 1800 mg via SUBCUTANEOUS
  Filled 2022-01-12: qty 15

## 2022-01-12 MED ORDER — DIPHENHYDRAMINE HCL 25 MG PO CAPS
50.0000 mg | ORAL_CAPSULE | Freq: Once | ORAL | Status: AC
  Administered 2022-01-12: 50 mg via ORAL
  Filled 2022-01-12: qty 2

## 2022-01-12 MED ORDER — MONTELUKAST SODIUM 10 MG PO TABS
10.0000 mg | ORAL_TABLET | Freq: Once | ORAL | Status: AC
  Administered 2022-01-12: 10 mg via ORAL
  Filled 2022-01-12: qty 1

## 2022-01-12 NOTE — Patient Instructions (Signed)
[facility]ICAL [facility]COLOG[facility]  Discharge Instructions: Thank you for choosing [facility] to provide your oncology and hematology care.   If you have a lab appointment with the Cancer Center, please go directly to the Cancer Center and check in at the registration area.   Wear comfortable clothing and clothing appropriate for easy access to any Portacath or PICC line.   We strive to give you quality time with your provider. You may need to reschedule your appointment if you arrive late (15 or more minutes).  Arriving late affects you and other patients whose appointments are after yours.  Also, if you miss three or more appointments without notifying the office, you may be dismissed from the clinic at the provider's discretion.      For prescription refill requests, have your pharmacy contact our office and allow 72 hours for refills to be completed.    Today you received the following chemotherapy and/or immunotherapy agents darzalex faspro      To help prevent nausea and vomiting after your treatment, we encourage you to take your nausea medication as directed.  BELOW ARE SYMPTOMS THAT SHOULD BE REPORTED IMMEDIATELY: *FEVER GREATER THAN 100.4 F (38 C) OR HIGHER *CHILLS OR SWEATING *NAUSEA AND VOMITING THAT IS NOT CONTROLLED WITH YOUR NAUSEA MEDICATION *UNUSUAL SHORTNESS OF BREATH *UNUSUAL BRUISING OR BLEEDING *URINARY PROBLEMS (pain or burning when urinating, or frequent urination) *BOWEL PROBLEMS (unusual diarrhea, constipation, pain near the anus) TENDERNESS IN MOUTH AND THROAT WITH OR WITHOUT PRESENCE OF ULCERS (sore throat, sores in mouth, or a toothache) UNUSUAL RASH, SWELLING OR PAIN  UNUSUAL VAGINAL DISCHARGE OR ITCHING   Items with * indicate a potential emergency and should be followed up as soon as possible or go to the Emergency Department if any problems should occur.  Please show the CHEMOTHERAPY ALERT CARD or IMMUNOTHERAPY ALERT CARD at  check-in to the Emergency Department and triage nurse.  Should you have questions after your visit or need to cancel or reschedule your appointment, please contact [facility]  Dept: [phone]  and follow the prompts.  Office hours are 8:00 a.m. to 4:30 p.m. Monday - Friday. Please note that voicemails left after 4:00 p.m. may not be returned until the following business day.  We are closed weekends and major holidays. You have access to a nurse at all times for urgent questions. Please call the main number to the clinic Dept: [phone] and follow the prompts.   For any non-urgent questions, you may also contact your provider using MyChart. We now offer e-Visits for anyone 18 and older to request care online for non-urgent symptoms. For details visit [email or web address].   Also download the MyChart app! Go to the app store, search "MyChart", open the app, select [facility], and log in with your MyChart username and password.  Masks are optional in the cancer centers. If you would like for your care team to wear a mask while they are taking care of you, please let them know. You may have one support person who is at least 71 years old accompany you for your appointments. 

## 2022-01-13 ENCOUNTER — Ambulatory Visit
Admission: RE | Admit: 2022-01-13 | Discharge: 2022-01-13 | Disposition: A | Discharge status: Home / Self Care | Payer: Medicare Other | Source: Ambulatory Visit | Attending: Radiation Oncology | Admitting: Radiation Oncology

## 2022-01-13 ENCOUNTER — Other Ambulatory Visit: Payer: Self-pay

## 2022-01-13 ENCOUNTER — Encounter (HOSPITAL_COMMUNITY): Payer: Self-pay | Admitting: Cardiology

## 2022-01-13 DIAGNOSIS — D472 Monoclonal gammopathy: Secondary | ICD-10-CM | POA: Diagnosis not present

## 2022-01-13 DIAGNOSIS — C9000 Multiple myeloma not having achieved remission: Secondary | ICD-10-CM | POA: Diagnosis not present

## 2022-01-13 LAB — RAD ONC ARIA SESSION SUMMARY
Course Elapsed Days: 7
Plan Fractions Treated to Date: 6
Plan Fractions Treated to Date: 6
Plan Fractions Treated to Date: 6
Plan Prescribed Dose Per Fraction: 2 Gy
Plan Prescribed Dose Per Fraction: 2 Gy
Plan Prescribed Dose Per Fraction: 2 Gy
Plan Total Fractions Prescribed: 10
Plan Total Fractions Prescribed: 10
Plan Total Fractions Prescribed: 10
Plan Total Prescribed Dose: 20 Gy
Plan Total Prescribed Dose: 20 Gy
Plan Total Prescribed Dose: 20 Gy
Reference Point Dosage Given to Date: 12 Gy
Reference Point Dosage Given to Date: 12 Gy
Reference Point Dosage Given to Date: 12 Gy
Reference Point Session Dosage Given: 2 Gy
Reference Point Session Dosage Given: 2 Gy
Reference Point Session Dosage Given: 2 Gy
Session Number: 6

## 2022-01-14 ENCOUNTER — Other Ambulatory Visit: Payer: Self-pay

## 2022-01-14 ENCOUNTER — Ambulatory Visit
Admission: RE | Admit: 2022-01-14 | Discharge: 2022-01-14 | Disposition: A | Discharge status: Home / Self Care | Payer: Medicare Other | Source: Ambulatory Visit | Attending: Radiation Oncology | Admitting: Radiation Oncology

## 2022-01-14 DIAGNOSIS — D472 Monoclonal gammopathy: Secondary | ICD-10-CM | POA: Diagnosis not present

## 2022-01-14 DIAGNOSIS — C9000 Multiple myeloma not having achieved remission: Secondary | ICD-10-CM | POA: Diagnosis not present

## 2022-01-14 LAB — RAD ONC ARIA SESSION SUMMARY
Course Elapsed Days: 8
Plan Fractions Treated to Date: 7
Plan Fractions Treated to Date: 7
Plan Fractions Treated to Date: 7
Plan Prescribed Dose Per Fraction: 2 Gy
Plan Prescribed Dose Per Fraction: 2 Gy
Plan Prescribed Dose Per Fraction: 2 Gy
Plan Total Fractions Prescribed: 10
Plan Total Fractions Prescribed: 10
Plan Total Fractions Prescribed: 10
Plan Total Prescribed Dose: 20 Gy
Plan Total Prescribed Dose: 20 Gy
Plan Total Prescribed Dose: 20 Gy
Reference Point Dosage Given to Date: 14 Gy
Reference Point Dosage Given to Date: 14 Gy
Reference Point Dosage Given to Date: 14 Gy
Reference Point Session Dosage Given: 2 Gy
Reference Point Session Dosage Given: 2 Gy
Reference Point Session Dosage Given: 2 Gy
Session Number: 7

## 2022-01-15 ENCOUNTER — Other Ambulatory Visit: Payer: Self-pay

## 2022-01-15 ENCOUNTER — Ambulatory Visit
Admission: RE | Admit: 2022-01-15 | Discharge: 2022-01-15 | Disposition: A | Discharge status: Home / Self Care | Payer: Medicare Other | Source: Ambulatory Visit | Attending: Radiation Oncology | Admitting: Radiation Oncology

## 2022-01-15 DIAGNOSIS — D472 Monoclonal gammopathy: Secondary | ICD-10-CM | POA: Diagnosis not present

## 2022-01-15 DIAGNOSIS — C9000 Multiple myeloma not having achieved remission: Secondary | ICD-10-CM | POA: Diagnosis not present

## 2022-01-15 DIAGNOSIS — M84421D Pathological fracture, right humerus, subsequent encounter for fracture with routine healing: Secondary | ICD-10-CM | POA: Diagnosis not present

## 2022-01-15 LAB — RAD ONC ARIA SESSION SUMMARY
Course Elapsed Days: 9
Plan Fractions Treated to Date: 8
Plan Fractions Treated to Date: 8
Plan Fractions Treated to Date: 8
Plan Prescribed Dose Per Fraction: 2 Gy
Plan Prescribed Dose Per Fraction: 2 Gy
Plan Prescribed Dose Per Fraction: 2 Gy
Plan Total Fractions Prescribed: 10
Plan Total Fractions Prescribed: 10
Plan Total Fractions Prescribed: 10
Plan Total Prescribed Dose: 20 Gy
Plan Total Prescribed Dose: 20 Gy
Plan Total Prescribed Dose: 20 Gy
Reference Point Dosage Given to Date: 16 Gy
Reference Point Dosage Given to Date: 16 Gy
Reference Point Dosage Given to Date: 16 Gy
Reference Point Session Dosage Given: 2 Gy
Reference Point Session Dosage Given: 2 Gy
Reference Point Session Dosage Given: 2 Gy
Session Number: 8

## 2022-01-18 ENCOUNTER — Other Ambulatory Visit: Payer: Self-pay

## 2022-01-18 ENCOUNTER — Inpatient Hospital Stay: Payer: Medicare Other

## 2022-01-18 ENCOUNTER — Inpatient Hospital Stay (HOSPITAL_BASED_OUTPATIENT_CLINIC_OR_DEPARTMENT_OTHER): Payer: Medicare Other | Admitting: Hematology and Oncology

## 2022-01-18 ENCOUNTER — Ambulatory Visit
Admission: RE | Admit: 2022-01-18 | Discharge: 2022-01-18 | Disposition: A | Discharge status: Home / Self Care | Payer: Medicare Other | Source: Ambulatory Visit | Attending: Radiation Oncology | Admitting: Radiation Oncology

## 2022-01-18 ENCOUNTER — Encounter (HOSPITAL_COMMUNITY): Payer: Self-pay

## 2022-01-18 VITALS — BP 151/72 | HR 57 | Temp 97.9°F | Resp 14 | Wt 254.6 lb

## 2022-01-18 VITALS — BP 128/69 | HR 51 | Resp 18

## 2022-01-18 DIAGNOSIS — C9000 Multiple myeloma not having achieved remission: Secondary | ICD-10-CM | POA: Diagnosis not present

## 2022-01-18 DIAGNOSIS — D472 Monoclonal gammopathy: Secondary | ICD-10-CM | POA: Diagnosis not present

## 2022-01-18 LAB — CBC WITH DIFFERENTIAL (CANCER CENTER ONLY)
Abs Immature Granulocytes: 0.01 10*3/uL (ref 0.00–0.07)
Basophils Absolute: 0 10*3/uL (ref 0.0–0.1)
Basophils Relative: 1 %
Eosinophils Absolute: 0.1 10*3/uL (ref 0.0–0.5)
Eosinophils Relative: 4 %
HCT: 38.9 % — ABNORMAL LOW (ref 39.0–52.0)
Hemoglobin: 13.6 g/dL (ref 13.0–17.0)
Immature Granulocytes: 0 %
Lymphocytes Relative: 8 %
Lymphs Abs: 0.3 10*3/uL — ABNORMAL LOW (ref 0.7–4.0)
MCH: 32.2 pg (ref 26.0–34.0)
MCHC: 35 g/dL (ref 30.0–36.0)
MCV: 92 fL (ref 80.0–100.0)
Monocytes Absolute: 0.6 10*3/uL (ref 0.1–1.0)
Monocytes Relative: 16 %
Neutro Abs: 2.8 10*3/uL (ref 1.7–7.7)
Neutrophils Relative %: 71 %
Platelet Count: 127 10*3/uL — ABNORMAL LOW (ref 150–400)
RBC: 4.23 MIL/uL (ref 4.22–5.81)
RDW: 13.4 % (ref 11.5–15.5)
WBC Count: 4 10*3/uL (ref 4.0–10.5)
nRBC: 0 % (ref 0.0–0.2)

## 2022-01-18 LAB — RAD ONC ARIA SESSION SUMMARY
Course Elapsed Days: 12
Plan Fractions Treated to Date: 9
Plan Fractions Treated to Date: 9
Plan Fractions Treated to Date: 9
Plan Prescribed Dose Per Fraction: 2 Gy
Plan Prescribed Dose Per Fraction: 2 Gy
Plan Prescribed Dose Per Fraction: 2 Gy
Plan Total Fractions Prescribed: 10
Plan Total Fractions Prescribed: 10
Plan Total Fractions Prescribed: 10
Plan Total Prescribed Dose: 20 Gy
Plan Total Prescribed Dose: 20 Gy
Plan Total Prescribed Dose: 20 Gy
Reference Point Dosage Given to Date: 18 Gy
Reference Point Dosage Given to Date: 18 Gy
Reference Point Dosage Given to Date: 18 Gy
Reference Point Session Dosage Given: 2 Gy
Reference Point Session Dosage Given: 2 Gy
Reference Point Session Dosage Given: 2 Gy
Session Number: 9

## 2022-01-18 LAB — CMP (CANCER CENTER ONLY)
ALT: 31 U/L (ref 0–44)
AST: 15 U/L (ref 15–41)
Albumin: 3.8 g/dL (ref 3.5–5.0)
Alkaline Phosphatase: 127 U/L — ABNORMAL HIGH (ref 38–126)
Anion gap: 11 (ref 5–15)
BUN: 14 mg/dL (ref 8–23)
CO2: 24 mmol/L (ref 22–32)
Calcium: 8.8 mg/dL — ABNORMAL LOW (ref 8.9–10.3)
Chloride: 104 mmol/L (ref 98–111)
Creatinine: 0.79 mg/dL (ref 0.61–1.24)
GFR, Estimated: >60 mL/min (ref >60)
Glucose, Bld: 126 mg/dL — ABNORMAL HIGH (ref 70–99)
Potassium: 3.2 mmol/L — ABNORMAL LOW (ref 3.5–5.1)
Sodium: 139 mmol/L (ref 135–145)
Total Bilirubin: 0.4 mg/dL (ref 0.3–1.2)
Total Protein: 5.8 g/dL — ABNORMAL LOW (ref 6.5–8.1)

## 2022-01-18 LAB — LACTATE DEHYDROGENASE: LDH: 108 U/L (ref 98–192)

## 2022-01-18 MED ORDER — DARATUMUMAB-HYALURONIDASE-FIHJ 1800-30000 MG-UT/15ML ~~LOC~~ SOLN
1800.0000 mg | Freq: Once | SUBCUTANEOUS | Status: AC
  Administered 2022-01-18: 1800 mg via SUBCUTANEOUS
  Filled 2022-01-18: qty 15

## 2022-01-18 MED ORDER — SODIUM CHLORIDE 0.9 % IV SOLN: Freq: Once | INTRAVENOUS | Status: DC

## 2022-01-18 MED ORDER — DIPHENHYDRAMINE HCL 25 MG PO CAPS
50.0000 mg | ORAL_CAPSULE | Freq: Once | ORAL | Status: AC
  Administered 2022-01-18: 50 mg via ORAL
  Filled 2022-01-18: qty 2

## 2022-01-18 MED ORDER — ACETAMINOPHEN 325 MG PO TABS
650.0000 mg | ORAL_TABLET | Freq: Once | ORAL | Status: AC
  Administered 2022-01-18: 650 mg via ORAL
  Filled 2022-01-18: qty 2

## 2022-01-18 NOTE — Progress Notes (Signed)
[place] Telephone:([phone]   [phone]) [phone]  PROGRESS NOTE  Patient Care Team: Lajean Manes, MD as PCP - General (Internal Medicine) Fay Records, MD as PCP - Cardiology (Cardiology) Sueanne Margarita, MD as PCP - Sleep Medicine (Cardiology) Thompson Grayer, MD as PCP - Electrophysiology (Cardiology) Michael Boston, MD as Consulting Physician (General Surgery) Clarene Essex, MD as Consulting Physician (Gastroenterology) Sueanne Margarita, MD as Consulting Physician (Sleep Medicine) Alda Berthold, DO as Consulting Physician (Neurology)  Hematological/Oncological History # IgA Kappa Multiple Myeloma 05/05/2020: Bone marrow biopsy showed increased number of plasma cells representing 14% of all cells in the aspirate associated with numerous variably sized clusters in the clot and biopsy sections 04/10/2021: M protein 0.3, IgA kappa specificity. Kappa 580.5, Labda 7.0, ratio 82.93.   12/07/2021: Labs show of 1971.3, lambda 5.5, ratio 358.42 12/10/2021: Transition care to Dr. Lorenso Courier. 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   Interval History:  Travis [name] 71 y.o. male with medical history significant for IgA Kappa Multiple Myeloma who presents for a follow up visit. The patient's last visit was on 01/05/2022. In the interim since the last visit he has continued cycle 1 of Dara/Dex.  On exam today Mr. Keech is accompanied by his wife.  He reports that he is tolerating his treatment well.  He reports he will start his 7 days off of Revlimid on Friday.  He reports that he continues to have some difficulty with range of motion of his right arm.  He reports that he is not "in terrible pain but I know it is just there".  He notes that he tolerates the shots fine is not having any itching or rash at the site.  He notes that the pills can make him slightly  more sluggish.  He also notes that radiation takes a lot of energy out of him.  He is not having any nausea or vomiting does occasionally have some loose stools.  He has noticed some redness and dry skin on his neck and face.   He has had some occasional sweats but his appetite has been good and his weight is stable.  A full 10 point ROS is listed below.  MEDICAL HISTORY:  Past Medical History:  Diagnosis Date   Arthritis    "comes and goes; mostly in hands, occasionally elbow" (73/08/2874)   Complication of anesthesia    "just wanted to sleep alot  for couple days S/P VARICOCELE OR" (04/05/2017)   DVT (deep venous thrombosis) (HCC)    LLE   Dysrhythmia    PVCs    GERD (gastroesophageal reflux disease)    History of hiatal hernia    Hypertension    Impaired glucose tolerance    Multiple myeloma (HCC)    Obesity (BMI 30-39.9) 07/26/2016   OSA on CPAP 07/26/2016   Mild with AHI 12.5/hr now on CPAP at 14cm H2O   PAF (paroxysmal atrial fibrillation) ([place])    s/p PVI Ablation in 2018 // Flecainide Rx // Echo 8/21: EF 60-65, no RWMA, mild LVH, normal RV SF, moderate RVE, mild BAE, trivial MR, mild dilation of aortic root (40 mm)   Pneumonia    "may have had walking pneumonia a few years ago" (04/05/2017)   Pre-diabetes    Thrombophlebitis     SURGICAL HISTORY: Past Surgical History:  Procedure Laterality Date   ATRIAL FIBRILLATION ABLATION  04/05/2017   ATRIAL FIBRILLATION ABLATION N/A 04/05/2017   Procedure: ATRIAL FIBRILLATION ABLATION;  Surgeon: Thompson Grayer, MD;  Location: [facility];  Service: Cardiovascular;  Laterality: N/A;   BONE BIOPSY Right 12/17/2021   Procedure: BONE BIOPSY;  Surgeon: Hiram Gash, MD;  Location: [facility];  Service: Orthopedics;  Laterality: Right;   COMPLETE RIGHT HIP REPLACMENT  01/19/2019   EVALUATION UNDER ANESTHESIA WITH HEMORRHOIDECTOMY N/A 02/24/2017   Procedure: EXAM UNDER ANESTHESIA WITH  HEMORRHOIDECTOMY;  Surgeon: Michael Boston, MD;  Location: [facility];  Service: General;  Laterality: N/A;   EXCISIONAL TOTAL HIP ARTHROPLASTY WITH ANTIBIOTIC SPACERS Right 09/25/2020   Procedure: EXCISIONAL TOTAL HIP ARTHROPLASTY WITH ANTIBIOTIC SPACERS;  Surgeon: Paralee Cancel, MD;  Location: [facility];  Service: Orthopedics;  Laterality: Right;   FLEXIBLE SIGMOIDOSCOPY N/A 04/15/2015   Procedure:  UNSEDATED FLEXIBLE SIGMOIDOSCOPY;  Surgeon: [name], MD;  Location: [facility];  Service: Endoscopy;  Laterality: N/A;   HUMERUS IM NAIL Right 12/17/2021   Procedure: INTRAMEDULLARY (IM) NAIL HUMERAL;  Surgeon: Hiram Gash, MD;  Location: [facility];  Service: Orthopedics;  Laterality: Right;   KNEE ARTHROSCOPY Left ~ 2016   REIMPLANTATION OF TOTAL HIP Right 12/25/2020   Procedure: REIMPLANTATION/REVISION OF RIGHT TOTAL HIP VERSUS REPEAT IRRIGATION AND DEBRIDEMENT;  Surgeon: Paralee Cancel, MD;  Location: [facility];  Service: Orthopedics;  Laterality: Right;   TESTICLE SURGERY  1988   VARICOCELE   TONSILLECTOMY     VARICOSE VEIN SURGERY Bilateral     SOCIAL HISTORY: Social History   Socioeconomic History   Marital status: Married    Spouse name: Not on file   Number of children: 1   Years of education: law school   Highest education level: Not on file  Occupational History   Occupation: lawyer  Tobacco Use   Smoking status: Never   Smokeless tobacco: Never  Vaping Use   Vaping Use: Never used  Substance and Sexual Activity   Alcohol use: Yes    Alcohol/week: 2.0 standard drinks of alcohol    Types: 2 Glasses of wine per week    Comment: 2 glasses of wine a week, occ bourbon, beer   Drug use: No   Sexual activity: Not Currently  Other Topics Concern   Not on file  Social History Narrative   Lives in [place] with spouse in a 2 story home.  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    Social Determinants of Health   Financial Resource  Strain: Not on file  Food Insecurity: Not on file  Transportation Needs: Not on file  Physical Activity: Not on file  Stress: Not on file  Social Connections: Not on file  Intimate Partner Violence: Not on file    FAMILY HISTORY: Family History  Problem Relation Age of Onset   Dementia Mother    Stroke Father    Atrial fibrillation Father    Hypertension Father    Hypertension Paternal Grandfather    Dementia Maternal Grandmother     ALLERGIES:  is allergic to linezolid and vancomycin.  MEDICATIONS:  Current Outpatient Medications  Medication Sig Dispense Refill   Acetaminophen (TYLENOL EXTRA STRENGTH PO) Take 2 tablets by mouth as needed (For pain).     acyclovir (ZOVIRAX) 400 MG tablet Take 1 tablet (400 mg total) by mouth 2 (two) times daily. 180 tablet 3   amLODipine (  NORVASC) 10 MG tablet Take 10 mg by mouth daily.      apixaban (ELIQUIS) 5 MG TABS tablet TAKE ONE TABLET BY MOUTH TWICE A DAY 60 tablet 6   Ascorbic Acid (VITAMIN C) 100 MG tablet Take 100 mg by mouth daily.     cholecalciferol (VITAMIN D3) 25 MCG (1000 UNIT) tablet Take 1,000 Units by mouth daily.     clobetasol cream (TEMOVATE) 3.50 % Apply 1 Application topically as needed.     Cyanocobalamin (VITAMIN B-12 PO) Take 1 tablet by mouth daily. Not sure the dosage     dexamethasone (DECADRON) 4 MG tablet Take 10 tablets (40 mg total) by mouth once a week. 40 tablet 5   Emollient (UDDERLY SMOOTH) CREA Apply topically daily.     flecainide (TAMBOCOR) 50 MG tablet TAKE ONE AND ONE-HALF TABLETS BY MOUTH TWICE A DAY 270 tablet 3   fluticasone (FLONASE) 50 MCG/ACT nasal spray Place 1 spray into both nostrils daily as needed for allergies or rhinitis.     folic acid (FOLVITE) 1 MG tablet Take 1 mg by mouth daily.     furosemide (LASIX) 40 MG tablet Take 1 tablet (40 mg) by mouth every other day alternating with 1 and 1/2 tablets (60 mg) on the opposite days. (Patient not taking: Reported on 01/11/2022) 113 tablet 3    gabapentin (NEURONTIN) 300 MG capsule TAKE TWO CAPSULES BY MOUTH TWICE A DAY 360 capsule 0   hydrALAZINE (APRESOLINE) 10 MG tablet Take 1 tablet (10 mg total) by mouth 2 (two) times daily. AS NEEDED FOR HIGH BP 180 tablet 3   hydrochlorothiazide (HYDRODIURIL) 25 MG tablet TAKE ONE TABLET BY MOUTH ONE TIME DAILY 90 tablet 3   lenalidomide (REVLIMID) 25 MG capsule Take 1 capsule (25 mg total) by mouth daily. Take 21 days on, 7 days off, repeat every 28 days. [employer] auth # [id number] Date obtained: 12/25/21 21 capsule 0   lisinopril (ZESTRIL) 40 MG tablet TAKE ONE TABLET BY MOUTH ONE TIME DAILY 90 tablet 3   metoprolol succinate (TOPROL-XL) 50 MG 24 hr tablet Take 0.5 tablets (25 mg total) by mouth daily. 90 tablet 3   oxymetazoline (AFRIN) 0.05 % nasal spray Place 1 spray into both nostrils 2 (two) times daily as needed for congestion.     potassium chloride SA (KLOR-CON M) 20 MEQ tablet TAKE ONE TABLET BY MOUTH ONE TIME DAILY, TAKE 40 MG (2 TABLETS) ON DAYS THAT YOU ALSO TAKE LASIX (FUROSEMIDE) 135 tablet 3   prochlorperazine (COMPAZINE) 10 MG tablet Take 1 tablet (10 mg total) by mouth every 6 (six) hours as needed for nausea or vomiting. 30 tablet 0   psyllium (METAMUCIL) 58.6 % packet Take 1 packet by mouth as needed.     sodium chloride (OCEAN) 0.65 % SOLN nasal spray Place 1 spray into both nostrils as needed for congestion.     triamcinolone cream (KENALOG) 0.1 % Apply 1 application topically daily as needed (itching).     No current facility-administered medications for this visit.    REVIEW OF SYSTEMS:   Constitutional: ( - ) fevers, ( - )  chills , ( - ) night sweats Eyes: ( - ) blurriness of vision, ( - ) double vision, ( - ) watery eyes Ears, nose, mouth, throat, and face: ( - ) mucositis, ( - ) sore throat Respiratory: ( - ) cough, ( - ) dyspnea, ( - ) wheezes Cardiovascular: ( - ) palpitation, ( - ) chest discomfort, ( - )  lower extremity swelling Gastrointestinal:  ( - ) nausea, ( -  ) heartburn, ( - ) change in bowel habits Skin: ( - ) abnormal skin rashes Lymphatics: ( - ) new lymphadenopathy, ( - ) easy bruising Neurological: ( - ) numbness, ( - ) tingling, ( - ) new weaknesses Behavioral/Psych: ( - ) mood change, ( - ) new changes  All other systems were reviewed with the patient and are negative.  PHYSICAL EXAMINATION: ECOG PERFORMANCE STATUS: 1 - Symptomatic but completely ambulatory  Vitals:   01/18/22 0853  BP: (!) 151/72  Pulse: (!) 57  Resp: 14  Temp: 97.9 F (36.6 C)  SpO2: 98%   Filed Weights   01/18/22 0853  Weight: 254 lb 9.6 oz (115.5 kg)    GENERAL: Well-appearing elderly Caucasian male, alert, no distress and comfortable SKIN: skin color, texture, turgor are normal, no rashes or significant lesions EYES: conjunctiva are pink and non-injected, sclera clear LUNGS: clear to auscultation and percussion with normal breathing effort HEART: regular rate & rhythm and no murmurs and no lower extremity edema Musculoskeletal: no cyanosis of digits and no clubbing  PSYCH: alert & oriented x 3, fluent speech NEURO: no focal motor/sensory deficits  LABORATORY DATA:  I have reviewed the data as listed    Latest Ref Rng & Units 01/18/2022    8:33 AM 01/12/2022    9:15 AM 01/05/2022    8:46 AM  CBC  WBC 4.0 - 10.5 K/uL 4.0  6.0  8.1   Hemoglobin 13.0 - 17.0 g/dL 13.6  14.5  14.3   Hematocrit 39.0 - 52.0 % 38.9  42.0  40.6   Platelets 150 - 400 K/uL 127  170  257        Latest Ref Rng & Units 01/18/2022    8:33 AM 01/12/2022    9:15 AM 01/05/2022    8:46 AM  CMP  Glucose 70 - 99 mg/dL 126  127  111   BUN 8 - 23 mg/dL '14  14  15   ' Creatinine 1.54 - 1.24 mg/dL 0.79  0.90  0.79   Sodium 135 - 145 mmol/L 139  142  140   Potassium 3.5 - 5.1 mmol/L 3.2  3.4  3.5   Chloride 98 - 111 mmol/L 104  105  102   CO2 22 - 32 mmol/L '24  30  31   ' Calcium 8.9 - 10.3 mg/dL 8.8  9.2  9.8   Total Protein 6.5 - 8.1 g/dL 5.8  5.8  6.4   Total Bilirubin 0.3 - 1.2  mg/dL 0.4  0.6  0.5   Alkaline Phos 38 - 126 U/L 127  111  135   AST 15 - 41 U/L '15  15  16   ' ALT 0 - 44 U/L '31  24  26     ' Lab Results  Component Value Date   MPROTEIN 0.1 (H) 01/05/2022   MPROTEIN 0.2 (H) 12/07/2021   MPROTEIN 0.3 (H) 04/10/2021   Lab Results  Component Value Date   KPAFRELGTCHN 1,438.6 (H) 01/05/2022   KPAFRELGTCHN 1,971.3 (H) 12/07/2021   KPAFRELGTCHN 580.5 (H) 04/10/2021   LAMBDASER 2.1 (L) 01/05/2022   LAMBDASER 5.5 (L) 12/07/2021   LAMBDASER 7.0 04/10/2021   KAPLAMBRATIO 685.05 (H) 01/05/2022   KAPLAMBRATIO 2,223.53 (H) 12/28/2021   KAPLAMBRATIO 358.42 (H) 12/07/2021     RADIOGRAPHIC STUDIES: No results found.  ASSESSMENT & PLAN MARTEZE VECCHIO 71 y.o. male with medical history significant for IgA Kappa  Multiple Myeloma who presents for a follow up visit.   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  He meets diagnostic criteria by having a serum free light chain ratio greater than 100 and lytic lesions causing pathological fracture of the humerus.  Given this I would recommend we pursued Darzalex, Revlimid, and dexamethasone.  Given his advanced age I would not recommend transplantation, that this is something we can consider when it comes time to transition to maintenance therapy versus transplant.  The patient voices understanding of the plan moving forward.  # IgA Kappa Multiple Myeloma --Metastatic survey completed 12/15/2021 --Patient underwent fixation of his humerus on 12/17/2021, pathology confirmed plasmacytoma/plasma cell neoplasm.  --Cycle 1 Day 1 of  Darzalex, Revlimid, and dexamethasone started on 12/28/2021.  Plan: --labs today show white blood cell 4.0, hemoglobin 13.6, MCV 92.0, and platelets of 127 --today is Cycle 1 Day 22 of Dara/Dex/Revlimid --RTC in 2 weeks with interval weekly treatments   #Supportive Care -- chemotherapy education  complete -- port placement not required -- zofran 41m q8H PRN and compazine 177mPO q6H for nausea -- acyclovir 40062mO BID for VCZ prophylaxis -- Zometa/Xgeva treatment to start once dental clearance is obtained. -- Pain is managed right now with tylenol  Offered stronger pain medication if pain were to worsen.    Orders Placed This Encounter  Procedures   Multiple Myeloma Panel (SPEP&IFE w/QIG)    Standing Status:   Future    Standing Expiration Date:   02/23/2023   Kappa/lambda light chains    Standing Status:   Future    Standing Expiration Date:   02/23/2023   [facility]    Standing Status:   Future    Standing Expiration Date:   02/24/2023   Lactate dehydrogenase (LDH)    Standing Status:   Future    Standing Expiration Date:   02/23/2023   CBC with Differential ([facility])    Standing Status:   Future    Standing Expiration Date:   02/24/2023   CMP ([facility])    Standing Status:   Future    Standing Expiration Date:   03/10/2023   Lactate dehydrogenase (LDH)    Standing Status:   Future    Standing Expiration Date:   03/09/2023   CBC with Differential (Cancer Center Only)    Standing Status:   Future    Standing Expiration Date:   03/10/2023    All questions were answered. The patient knows to call the clinic with any problems, questions or concerns.  A total of more than 30 minutes were spent on this encounter with face-to-face time and non-face-to-face time, including preparing to see the patient, ordering tests and/or medications, counseling the patient and coordination of care as outlined above.   JohLedell PeoplesD Department of Hematology/Oncology [facility]: [phone]: [phone]: [email or web address]  01/19/2022 2:12 PM

## 2022-01-18 NOTE — Progress Notes (Signed)
Pt states he took decadron at home before arriving to infusion.

## 2022-01-18 NOTE — Patient Instructions (Signed)
[facility] MEDICAL [facility]COLOGY  Discharge Instructions: Thank you for choosing [facility] to provide your oncology and hematology care.   If you have a lab appointment with the Cancer Center, please go directly to the Cancer Center and check in at the registration area.   Wear comfortable clothing and clothing appropriate for easy access to any Portacath or PICC line.   We strive to give you quality time with your provider. You may need to reschedule your appointment if you arrive late (15 or more minutes).  Arriving late affects you and other patients whose appointments are after yours.  Also, if you miss three or more appointments without notifying the office, you may be dismissed from the clinic at the provider's discretion.      For prescription refill requests, have your pharmacy contact our office and allow 72 hours for refills to be completed.    Today you received the following chemotherapy and/or immunotherapy agents: Dara Faspro.      To help prevent nausea and vomiting after your treatment, we encourage you to take your nausea medication as directed.  BELOW ARE SYMPTOMS THAT SHOULD BE REPORTED IMMEDIATELY: *FEVER GREATER THAN 100.4 F (38 C) OR HIGHER *CHILLS OR SWEATING *NAUSEA AND VOMITING THAT IS NOT CONTROLLED WITH YOUR NAUSEA MEDICATION *UNUSUAL SHORTNESS OF BREATH *UNUSUAL BRUISING OR BLEEDING *URINARY PROBLEMS (pain or burning when urinating, or frequent urination) *BOWEL PROBLEMS (unusual diarrhea, constipation, pain near the anus) TENDERNESS IN MOUTH AND THROAT WITH OR WITHOUT PRESENCE OF ULCERS (sore throat, sores in mouth, or a toothache) UNUSUAL RASH, SWELLING OR PAIN  UNUSUAL VAGINAL DISCHARGE OR ITCHING   Items with * indicate a potential emergency and should be followed up as soon as possible or go to the Emergency Department if any problems should occur.  Please show the CHEMOTHERAPY ALERT CARD or IMMUNOTHERAPY ALERT CARD at check-in  to the Emergency Department and triage nurse.  Should you have questions after your visit or need to cancel or reschedule your appointment, please contact [facility]  Dept: [phone]  and follow the prompts.  Office hours are 8:00 a.m. to 4:30 p.m. Monday - Friday. Please note that voicemails left after 4:00 p.m. may not be returned until the following business day.  We are closed weekends and major holidays. You have access to a nurse at all times for urgent questions. Please call the main number to the clinic Dept: [phone] and follow the prompts.   For any non-urgent questions, you may also contact your provider using MyChart. We now offer e-Visits for anyone 18 and older to request care online for non-urgent symptoms. For details visit [email or web address].   Also download the MyChart app! Go to the app store, search "MyChart", open the app, select [facility], and log in with your MyChart username and password.  Masks are optional in the cancer centers. If you would like for your care team to wear a mask while they are taking care of you, please let them know. You may have one support person who is at least 71 years old accompany you for your appointments. 

## 2022-01-19 ENCOUNTER — Ambulatory Visit
Admission: RE | Admit: 2022-01-19 | Discharge: 2022-01-19 | Disposition: A | Discharge status: Home / Self Care | Payer: Medicare Other | Source: Ambulatory Visit | Attending: Radiation Oncology | Admitting: Radiation Oncology

## 2022-01-19 ENCOUNTER — Encounter: Payer: Self-pay | Admitting: Urology

## 2022-01-19 ENCOUNTER — Other Ambulatory Visit: Payer: Self-pay

## 2022-01-19 ENCOUNTER — Encounter: Payer: Self-pay | Admitting: Neurology

## 2022-01-19 ENCOUNTER — Encounter: Payer: Self-pay | Admitting: Hematology and Oncology

## 2022-01-19 DIAGNOSIS — C9000 Multiple myeloma not having achieved remission: Secondary | ICD-10-CM | POA: Diagnosis not present

## 2022-01-19 DIAGNOSIS — Z510 Encounter for antineoplastic radiation therapy: Secondary | ICD-10-CM | POA: Diagnosis not present

## 2022-01-19 DIAGNOSIS — D472 Monoclonal gammopathy: Secondary | ICD-10-CM | POA: Diagnosis not present

## 2022-01-19 LAB — RAD ONC ARIA SESSION SUMMARY
Course Elapsed Days: 13
Plan Fractions Treated to Date: 10
Plan Fractions Treated to Date: 10
Plan Fractions Treated to Date: 10
Plan Prescribed Dose Per Fraction: 2 Gy
Plan Prescribed Dose Per Fraction: 2 Gy
Plan Prescribed Dose Per Fraction: 2 Gy
Plan Total Fractions Prescribed: 10
Plan Total Fractions Prescribed: 10
Plan Total Fractions Prescribed: 10
Plan Total Prescribed Dose: 20 Gy
Plan Total Prescribed Dose: 20 Gy
Plan Total Prescribed Dose: 20 Gy
Reference Point Dosage Given to Date: 20 Gy
Reference Point Dosage Given to Date: 20 Gy
Reference Point Dosage Given to Date: 20 Gy
Reference Point Session Dosage Given: 2 Gy
Reference Point Session Dosage Given: 2 Gy
Reference Point Session Dosage Given: 2 Gy
Session Number: 10

## 2022-01-20 ENCOUNTER — Other Ambulatory Visit: Payer: Self-pay | Admitting: *Deleted

## 2022-01-20 ENCOUNTER — Ambulatory Visit (HOSPITAL_COMMUNITY): Admission: RE | Admit: 2022-01-20 | Payer: Medicare Other | Source: Home / Self Care | Admitting: Cardiology

## 2022-01-20 DIAGNOSIS — C9000 Multiple myeloma not having achieved remission: Secondary | ICD-10-CM

## 2022-01-20 SURGERY — CARDIOVERSION
Anesthesia: Monitor Anesthesia Care

## 2022-01-20 MED ORDER — LENALIDOMIDE 25 MG PO CAPS: 25.0000 mg | ORAL_CAPSULE | Freq: Every day | ORAL | 0 refills | Status: DC

## 2022-01-20 NOTE — Telephone Encounter (Signed)
Oral Oncology Patient Advocate Encounter   Received notification that the application for grant assistance through [employer] has been denied due to income exceeds limit.   I have spoken to the patient.   Lady Deutscher, CPhT-Adv Oncology Pharmacy Patient [place] Direct Number: [phone]  Fax: [phone]

## 2022-01-21 NOTE — Progress Notes (Signed)
Cardiology Office Note:    Date:  01/22/2022   ID:  [name], DOB Aug 05, 1950, MRN [id number]  PCP:  Lajean Manes, [facility] Cardiologist:  [name], MD  [facility] Electrophysiologist:  Thompson Grayer, MD   Chief Complaint: follow up.   History of Present Illness:    Travis [name] is a 71 y.o. male with a hx of of HTN, OSA on CPAP, prior DVT, atrial fibrillation s/p ablation and multiple myeloma seen for follow up.   Echocardiogram December 02, 2021 with LV function of 60 to 65%, Travis regional wall motion abnormality.  Patient is started on Darzalex, Revlimid, and dexamethasone 12/28/21 for multiple myeloma.  Found in on pathology report on shoulder surgery.  Patient with longstanding history of atrial fibrillation and underwent ablation by Dr. Rayann Heman in 2018.  He has been maintained on flecainide and Eliquis.  Recently seen in atrial fibrillation clinic 01/11/2022 noted in atrial flutter.  Plan for outpatient cardioversion but he spontaneously converted (documented on Kardia device) and canceled the cardioversion.  Here today for follow-up.  He can tell when he goes out of rhythm.  He feels fatigue.  Completed 10 days of radiation at his shoulder.  Denies chest pain, shortness of breath, orthopnea, PND, syncope, lower extremity edema or melena.  Past Medical History:  Diagnosis Date   Arthritis    "comes and goes; mostly in hands, occasionally elbow" (94/10/6544)   Complication of anesthesia    "just wanted to sleep alot  for couple days S/P VARICOCELE OR" (04/05/2017)   DVT (deep venous thrombosis) (HCC)    LLE   Dysrhythmia    PVCs    GERD (gastroesophageal reflux disease)    History of hiatal hernia    Hypertension    Impaired glucose tolerance    Multiple myeloma (HCC)    Obesity (BMI 30-39.9) 07/26/2016   OSA on CPAP 07/26/2016   Mild with AHI 12.5/hr now on CPAP at 14cm H2O   PAF (paroxysmal atrial fibrillation) ([place])    s/p PVI Ablation in 2018 //  Flecainide Rx // Echo 8/21: EF 60-65, Travis RWMA, mild LVH, normal RV SF, moderate RVE, mild BAE, trivial MR, mild dilation of aortic root (40 mm)   Pneumonia    "may have had walking pneumonia a few years ago" (04/05/2017)   Pre-diabetes    Thrombophlebitis     Past Surgical History:  Procedure Laterality Date   ATRIAL FIBRILLATION ABLATION  04/05/2017   ATRIAL FIBRILLATION ABLATION N/A 04/05/2017   Procedure: ATRIAL FIBRILLATION ABLATION;  Surgeon: Thompson Grayer, MD;  Location: [place] [facility];  Service: Cardiovascular;  Laterality: N/A;   BONE BIOPSY Right 12/17/2021   Procedure: BONE BIOPSY;  Surgeon: Hiram Gash, MD;  Location: [place];  Service: Orthopedics;  Laterality: Right;   COMPLETE RIGHT HIP REPLACMENT  01/19/2019   EVALUATION UNDER ANESTHESIA WITH HEMORRHOIDECTOMY N/A 02/24/2017   Procedure: EXAM UNDER ANESTHESIA WITH  HEMORRHOIDECTOMY;  Surgeon: Michael Boston, MD;  Location: [facility];  Service: General;  Laterality: N/A;   EXCISIONAL TOTAL HIP ARTHROPLASTY WITH ANTIBIOTIC SPACERS Right 09/25/2020   Procedure: EXCISIONAL TOTAL HIP ARTHROPLASTY WITH ANTIBIOTIC SPACERS;  Surgeon: Paralee Cancel, MD;  Location: [facility];  Service: Orthopedics;  Laterality: Right;   FLEXIBLE SIGMOIDOSCOPY N/A 04/15/2015   Procedure:  UNSEDATED FLEXIBLE SIGMOIDOSCOPY;  Surgeon: [name], MD;  Location: [facility];  Service: Endoscopy;  Laterality: N/A;   HUMERUS IM NAIL Right 12/17/2021   Procedure:  INTRAMEDULLARY (IM) NAIL HUMERAL;  Surgeon: Hiram Gash, MD;  Location: [place];  Service: Orthopedics;  Laterality: Right;   KNEE ARTHROSCOPY Left ~ 2016   REIMPLANTATION OF TOTAL HIP Right 12/25/2020   Procedure: REIMPLANTATION/REVISION OF RIGHT TOTAL HIP VERSUS REPEAT IRRIGATION AND DEBRIDEMENT;  Surgeon: Paralee Cancel, MD;  Location: [facility];  Service: Orthopedics;  Laterality: Right;   TESTICLE SURGERY  1988   VARICOCELE   TONSILLECTOMY     VARICOSE VEIN  SURGERY Bilateral     Current Medications: Current Meds  Medication Sig   Acetaminophen (TYLENOL EXTRA STRENGTH PO) Take 2 tablets by mouth as needed (For pain).   acyclovir (ZOVIRAX) 400 MG tablet Take 1 tablet (400 mg total) by mouth 2 (two) times daily.   amLODipine (NORVASC) 10 MG tablet Take 10 mg by mouth daily.    apixaban (ELIQUIS) 5 MG TABS tablet TAKE [name] TABLET BY MOUTH TWICE A DAY   Ascorbic Acid (VITAMIN C) 100 MG tablet Take 100 mg by mouth daily.   cholecalciferol (VITAMIN D3) 25 MCG (1000 UNIT) tablet Take 1,000 Units by mouth daily.   clobetasol cream (TEMOVATE) 3.88 % Apply 1 Application topically as needed.   Cyanocobalamin (VITAMIN B-12 PO) Take 1 tablet by mouth daily. Not sure the dosage   dexamethasone (DECADRON) 4 MG tablet Take 10 tablets (40 mg total) by mouth once a week.   Emollient (UDDERLY SMOOTH) CREA Apply topically daily.   flecainide (TAMBOCOR) 50 MG tablet TAKE [name] AND [name]-HALF TABLETS BY MOUTH TWICE A DAY   fluticasone (FLONASE) 50 MCG/ACT nasal spray Place 1 spray into both nostrils daily as needed for allergies or rhinitis.   folic acid (FOLVITE) 1 MG tablet Take 1 mg by mouth daily.   furosemide (LASIX) 40 MG tablet Take 1 tablet (40 mg) by mouth every other day alternating with 1 and 1/2 tablets (60 mg) on the opposite days.   gabapentin (NEURONTIN) 300 MG capsule TAKE TWO CAPSULES BY MOUTH TWICE A DAY   hydrALAZINE (APRESOLINE) 10 MG tablet Take 1 tablet (10 mg total) by mouth 2 (two) times daily. AS NEEDED FOR HIGH BP   hydrochlorothiazide (HYDRODIURIL) 25 MG tablet TAKE [name] TABLET BY MOUTH [name] TIME DAILY   lenalidomide (REVLIMID) 25 MG capsule Take 1 capsule (25 mg total) by mouth daily. Take 21 days on, 7 days off, repeat every 28 days. [employer] auth # [id number] Date obtained: 01/20/22/23   lisinopril (ZESTRIL) 40 MG tablet TAKE [name] TABLET BY MOUTH [name] TIME DAILY   metoprolol succinate (TOPROL-XL) 50 MG 24 hr tablet Take 0.5 tablets (25 mg total) by  mouth daily.   oxymetazoline (AFRIN) 0.05 % nasal spray Place 1 spray into both nostrils 2 (two) times daily as needed for congestion.   potassium chloride SA (KLOR-CON M) 20 MEQ tablet TAKE [name] TABLET BY MOUTH [name] TIME DAILY, TAKE 40 MG (2 TABLETS) ON DAYS THAT YOU ALSO TAKE LASIX (FUROSEMIDE)   prochlorperazine (COMPAZINE) 10 MG tablet Take 1 tablet (10 mg total) by mouth every 6 (six) hours as needed for nausea or vomiting.   psyllium (METAMUCIL) 58.6 % packet Take 1 packet by mouth as needed.   sodium chloride (OCEAN) 0.65 % SOLN nasal spray Place 1 spray into both nostrils as needed for congestion.   triamcinolone cream (KENALOG) 0.1 % Apply 1 application topically daily as needed (itching).     Allergies:   Linezolid and Vancomycin   Social History   Socioeconomic History  Marital status: Married    Spouse name: Not on file   Number of children: 1   Years of education: law school   Highest education level: Not on file  Occupational History   Occupation: lawyer  Tobacco Use   Smoking status: Never   Smokeless tobacco: Never  Vaping Use   Vaping Use: Never used  Substance and Sexual Activity   Alcohol use: Yes    Alcohol/week: 2.0 standard drinks of alcohol    Types: 2 Glasses of wine per week    Comment: 2 glasses of wine a week, occ bourbon, beer   Drug use: Travis   Sexual activity: Not Currently  Other Topics Concern   Not on file  Social History Narrative   Lives in [place] with spouse in a 2 story home.  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    Social Determinants of Health   Financial Resource Strain: Not on file  Food Insecurity: Not on file  Transportation Needs: Not on file  Physical Activity: Not on file  Stress: Not on file  Social Connections: Not on file     Family History: The patient's family history includes Atrial fibrillation in his father; Dementia in his maternal grandmother and  mother; Hypertension in his father and paternal grandfather; Stroke in his father.    ROS:   Please see the history of present illness.    All other systems reviewed and are negative.   EKGs/Labs/Other Studies Reviewed:    The following studies were reviewed today:  Echo 12/02/21 1. Global longitudinal strain is -20.7%. Left ventricular ejection  fraction, by estimation, is 60 to 65%. The left ventricle has normal  function. The left ventricle has Travis regional wall motion abnormalities.  Left ventricular diastolic parameters were  normal.   2. Right ventricular systolic function is normal. The right ventricular  size is normal.   3. The mitral valve is normal in structure. Mild mitral valve  regurgitation.   4. The aortic valve is normal in structure. Aortic valve regurgitation is  not visualized.   5. The inferior vena cava is normal in size with greater than 50%  respiratory variability, suggesting right atrial pressure of 3 mmHg.   EKG:  EKG is not ordered today.    Recent Labs: 11/17/2021: NT-Pro BNP 232; TSH 1.870 01/18/2022: ALT 31; BUN 14; Creatinine 0.79; Hemoglobin 13.6; Platelet Count 127; Potassium 3.2; Sodium 139  Recent Lipid Panel Travis results found for: "CHOL", "TRIG", "HDL", "CHOLHDL", "VLDL", "LDLCALC", "LDLDIRECT"   Physical Exam:    VS:  BP 138/78   Pulse (!) 52   Ht _0  (1.778 m)   Wt 251 lb (113.9 kg)   SpO2 97%   BMI 36.01 kg/m     Wt Readings from Last 3 Encounters:  01/22/22 251 lb (113.9 kg)  01/18/22 254 lb 9.6 oz (115.5 kg)  01/12/22 253 lb (114.8 kg)     GEN:  Well nourished, well developed in Travis acute distress HEENT: Normal NECK: Travis JVD; Travis carotid bruits LYMPHATICS: Travis lymphadenopathy CARDIAC: RRR, Travis murmurs, rubs, gallops RESPIRATORY:  Clear to auscultation without rales, wheezing or rhonchi  ABDOMEN: Soft, non-tender, non-distended MUSCULOSKELETAL:  Travis edema; Travis deformity  SKIN: Warm and dry NEUROLOGIC:  Alert and oriented x  3 PSYCHIATRIC:  Normal affect   ASSESSMENT AND PLAN:   Paroxysmal atrial fibrillation/flutter Likely recent episode of atrial flutter secondary  to radiation and chemo.  He spontaneously converted and did not require cardioversion.  He can tell when goes out of rhythm.  Continue flecainide and Toprol-XL.  Continue Eliquis for anticoagulation.  Travis bleeding issue.  2.  Hypertension Blood pressure stable and controlled on current medication.  Medication Adjustments/Labs and Tests Ordered: Current medicines are reviewed at length with the patient today.  Concerns regarding medicines are outlined above.  Travis orders of the defined types were placed in this encounter.  Travis orders of the defined types were placed in this encounter.   Patient Instructions  Medication Instructions:  Your physician recommends that you continue on your current medications as directed. Please refer to the Current Medication list given to you today. *If you need a refill on your cardiac medications before your next appointment, please call your pharmacy*   Lab Work: None Ordered   Testing/Procedures: None Ordered   Follow-Up: At [facility], you and your health needs are our priority.  As part of our continuing mission to provide you with exceptional heart care, we have created designated Provider Care Teams.  These Care Teams include your primary Cardiologist (physician) and Advanced Practice Providers (APPs -  Physician Assistants and Nurse Practitioners) who all work together to provide you with the care you need, when you need it.  We recommend signing up for the patient portal called "MyChart".  Sign up information is provided on this After Visit Summary.  MyChart is used to connect with patients for Virtual Visits (Telemedicine).  Patients are able to view lab/test results, encounter notes, upcoming appointments, etc.  Non-urgent messages can be sent to your provider as well.   To learn more about  what you can do with MyChart, go to [email or web address].    Your next appointment:   6 month(s)  The format for your next appointment:   In Person  Provider:   [name], MD     Other Instructions   Important Information About Sugar         Jarrett Soho, [place]  01/22/2022 2:59 PM    [place]

## 2022-01-22 ENCOUNTER — Ambulatory Visit: Payer: Medicare Other | Attending: Internal Medicine | Admitting: Physician Assistant

## 2022-01-22 ENCOUNTER — Encounter: Payer: Self-pay | Admitting: Physician Assistant

## 2022-01-22 VITALS — BP 138/78 | HR 52 | Ht 70.0 in | Wt 251.0 lb

## 2022-01-22 DIAGNOSIS — I10 Essential (primary) hypertension: Secondary | ICD-10-CM

## 2022-01-22 DIAGNOSIS — I480 Paroxysmal atrial fibrillation: Secondary | ICD-10-CM | POA: Diagnosis not present

## 2022-01-22 NOTE — Patient Instructions (Signed)
Medication Instructions:  Your physician recommends that you continue on your current medications as directed. Please refer to the Current Medication list given to you today. *If you need a refill on your cardiac medications before your next appointment, please call your pharmacy*   Lab Work: None Ordered   Testing/Procedures: None Ordered   Follow-Up: At [facility], you and your health needs are our priority.  As part of our continuing mission to provide you with exceptional heart care, we have created designated Provider Care Teams.  These Care Teams include your primary Cardiologist (physician) and Advanced Practice Providers (APPs -  Physician Assistants and Nurse Practitioners) who all work together to provide you with the care you need, when you need it.  We recommend signing up for the patient portal called "MyChart".  Sign up information is provided on this After Visit Summary.  MyChart is used to connect with patients for Virtual Visits (Telemedicine).  Patients are able to view lab/test results, encounter notes, upcoming appointments, etc.  Non-urgent messages can be sent to your provider as well.   To learn more about what you can do with MyChart, go to [email or web address].    Your next appointment:   6 month(s)  The format for your next appointment:   In Person  Provider:   [name], MD     Other Instructions   Important Information About Sugar

## 2022-01-25 DIAGNOSIS — Z0000 Encounter for general adult medical examination without abnormal findings: Secondary | ICD-10-CM | POA: Diagnosis not present

## 2022-01-25 DIAGNOSIS — I480 Paroxysmal atrial fibrillation: Secondary | ICD-10-CM | POA: Diagnosis not present

## 2022-01-25 DIAGNOSIS — Z79899 Other long term (current) drug therapy: Secondary | ICD-10-CM | POA: Diagnosis not present

## 2022-01-25 DIAGNOSIS — M25611 Stiffness of right shoulder, not elsewhere classified: Secondary | ICD-10-CM | POA: Diagnosis not present

## 2022-01-25 DIAGNOSIS — K760 Fatty (change of) liver, not elsewhere classified: Secondary | ICD-10-CM | POA: Diagnosis not present

## 2022-01-25 DIAGNOSIS — R7303 Prediabetes: Secondary | ICD-10-CM | POA: Diagnosis not present

## 2022-01-25 DIAGNOSIS — Z23 Encounter for immunization: Secondary | ICD-10-CM | POA: Diagnosis not present

## 2022-01-25 DIAGNOSIS — M6281 Muscle weakness (generalized): Secondary | ICD-10-CM | POA: Diagnosis not present

## 2022-01-25 DIAGNOSIS — M84421D Pathological fracture, right humerus, subsequent encounter for fracture with routine healing: Secondary | ICD-10-CM | POA: Diagnosis not present

## 2022-01-25 DIAGNOSIS — E7800 Pure hypercholesterolemia, unspecified: Secondary | ICD-10-CM | POA: Diagnosis not present

## 2022-01-25 DIAGNOSIS — I10 Essential (primary) hypertension: Secondary | ICD-10-CM | POA: Diagnosis not present

## 2022-01-25 DIAGNOSIS — C9000 Multiple myeloma not having achieved remission: Secondary | ICD-10-CM | POA: Diagnosis not present

## 2022-01-25 DIAGNOSIS — G4730 Sleep apnea, unspecified: Secondary | ICD-10-CM | POA: Diagnosis not present

## 2022-01-26 ENCOUNTER — Inpatient Hospital Stay: Payer: Medicare Other

## 2022-01-26 ENCOUNTER — Other Ambulatory Visit: Payer: Self-pay

## 2022-01-26 ENCOUNTER — Inpatient Hospital Stay (HOSPITAL_BASED_OUTPATIENT_CLINIC_OR_DEPARTMENT_OTHER): Payer: Medicare Other | Admitting: Hematology and Oncology

## 2022-01-26 ENCOUNTER — Ambulatory Visit (HOSPITAL_COMMUNITY)
Admission: RE | Admit: 2022-01-26 | Discharge: 2022-01-26 | Disposition: A | Discharge status: Home / Self Care | Payer: Medicare Other | Source: Ambulatory Visit | Attending: Hematology and Oncology | Admitting: Hematology and Oncology

## 2022-01-26 DIAGNOSIS — C9000 Multiple myeloma not having achieved remission: Secondary | ICD-10-CM

## 2022-01-26 DIAGNOSIS — R0781 Pleurodynia: Secondary | ICD-10-CM | POA: Diagnosis not present

## 2022-01-26 DIAGNOSIS — D472 Monoclonal gammopathy: Secondary | ICD-10-CM | POA: Diagnosis not present

## 2022-01-26 DIAGNOSIS — M47814 Spondylosis without myelopathy or radiculopathy, thoracic region: Secondary | ICD-10-CM | POA: Diagnosis not present

## 2022-01-26 LAB — CBC WITH DIFFERENTIAL (CANCER CENTER ONLY)
Abs Immature Granulocytes: 0.02 10*3/uL (ref 0.00–0.07)
Basophils Absolute: 0.1 10*3/uL (ref 0.0–0.1)
Basophils Relative: 1 %
Eosinophils Absolute: 0.1 10*3/uL (ref 0.0–0.5)
Eosinophils Relative: 1 %
HCT: 39.7 % (ref 39.0–52.0)
Hemoglobin: 14.1 g/dL (ref 13.0–17.0)
Immature Granulocytes: 0 %
Lymphocytes Relative: 2 %
Lymphs Abs: 0.2 10*3/uL — ABNORMAL LOW (ref 0.7–4.0)
MCH: 32.2 pg (ref 26.0–34.0)
MCHC: 35.5 g/dL (ref 30.0–36.0)
MCV: 90.6 fL (ref 80.0–100.0)
Monocytes Absolute: 0.3 10*3/uL (ref 0.1–1.0)
Monocytes Relative: 4 %
Neutro Abs: 6.2 10*3/uL (ref 1.7–7.7)
Neutrophils Relative %: 92 %
Platelet Count: 200 10*3/uL (ref 150–400)
RBC: 4.38 MIL/uL (ref 4.22–5.81)
RDW: 14 % (ref 11.5–15.5)
WBC Count: 6.7 10*3/uL (ref 4.0–10.5)
nRBC: 0 % (ref 0.0–0.2)

## 2022-01-26 LAB — CMP (CANCER CENTER ONLY)
ALT: 28 U/L (ref 0–44)
AST: 18 U/L (ref 15–41)
Albumin: 3.9 g/dL (ref 3.5–5.0)
Alkaline Phosphatase: 109 U/L (ref 38–126)
Anion gap: 7 (ref 5–15)
BUN: 13 mg/dL (ref 8–23)
CO2: 28 mmol/L (ref 22–32)
Calcium: 9 mg/dL (ref 8.9–10.3)
Chloride: 104 mmol/L (ref 98–111)
Creatinine: 0.83 mg/dL (ref 0.61–1.24)
GFR, Estimated: >60 mL/min (ref >60)
Glucose, Bld: 139 mg/dL — ABNORMAL HIGH (ref 70–99)
Potassium: 3.5 mmol/L (ref 3.5–5.1)
Sodium: 139 mmol/L (ref 135–145)
Total Bilirubin: 0.4 mg/dL (ref 0.3–1.2)
Total Protein: 6.2 g/dL — ABNORMAL LOW (ref 6.5–8.1)

## 2022-01-26 LAB — LACTATE DEHYDROGENASE: LDH: 145 U/L (ref 98–192)

## 2022-01-26 MED ORDER — DIPHENHYDRAMINE HCL 25 MG PO CAPS
50.0000 mg | ORAL_CAPSULE | Freq: Once | ORAL | Status: AC
  Administered 2022-01-26: 50 mg via ORAL
  Filled 2022-01-26: qty 2

## 2022-01-26 MED ORDER — ACETAMINOPHEN 325 MG PO TABS
650.0000 mg | ORAL_TABLET | Freq: Once | ORAL | Status: AC
  Administered 2022-01-26: 650 mg via ORAL
  Filled 2022-01-26: qty 2

## 2022-01-26 MED ORDER — DARATUMUMAB-HYALURONIDASE-FIHJ 1800-30000 MG-UT/15ML ~~LOC~~ SOLN
1800.0000 mg | Freq: Once | SUBCUTANEOUS | Status: AC
  Administered 2022-01-26: 1800 mg via SUBCUTANEOUS
  Filled 2022-01-26: qty 15

## 2022-01-26 NOTE — Progress Notes (Signed)
[facility] [phone]   [phone]  PROGRESS NOTE  Patient Care Team: Stoneking, Hal, MD as PCP - General (Internal Medicine) Ross, Paula V, MD as PCP - Cardiology (Cardiology) Turner, Traci R, MD as PCP - Sleep Medicine (Cardiology) Allred, James, MD as PCP - Electrophysiology (Cardiology) Gross, Steven, MD as Consulting Physician (General Surgery) Magod, Marc, MD as Consulting Physician (Gastroenterology) Turner, Traci R, MD as Consulting Physician (Sleep Medicine) Patel, Donika K, DO as Consulting Physician (Neurology)  Hematological/Oncological History # IgA Kappa Multiple Myeloma 05/05/2020: Bone marrow biopsy showed increased number of plasma cells representing 14% of all cells in the aspirate associated with numerous variably sized clusters in the clot and biopsy sections 04/10/2021: M protein 0.3, IgA kappa specificity. Kappa 580.5, Labda 7.0, ratio 82.93.   12/07/2021: Labs show of 1971.3, lambda 5.5, ratio 358.42 12/10/2021: Transition care to Dr. Dorsey. 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  01/26/2022: Cycle 2 Day 1 of Dara/Rev/Dex  Interval History:  Travis [name] 71 y.o. male with medical history significant for IgA Kappa Multiple Myeloma who presents for a follow up visit. The patient's last visit was on 01/18/2022. In the interim since the last visit he has continued of Dara/Rev/Dex.  On exam today Travis [name] is accompanied by his wife.  He reports he has been well overall interim since her last visit.  He continues to have left rib pain and would like to get an x-ray of this area.  He reports that his hip is stiff and he needs to "loosen up".  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  He reports that he is currently on his off week from the Revlimid.  He  is tolerating medication well with no nausea or vomiting though he does have some occasional loose stools.  He is also making progress with his arm and physical therapy.   His appetite has been good and his weight is stable.  He denies any fevers, chills, sweats, shortness of breath, chest pain.  A full 10 point ROS is listed below.  MEDICAL HISTORY:  Past Medical History:  Diagnosis Date   Arthritis    "comes and goes; mostly in hands, occasionally elbow" (04/05/2017)   Complication of anesthesia    "just wanted to sleep alot  for couple days S/P VARICOCELE OR" (04/05/2017)   DVT (deep venous thrombosis) (HCC)    LLE   Dysrhythmia    PVCs    GERD (gastroesophageal reflux disease)    History of hiatal hernia    Hypertension    Impaired glucose tolerance    Multiple myeloma (HCC)    Obesity (BMI 30-39.9) 07/26/2016   OSA on CPAP 07/26/2016   Mild with AHI 12.5/hr now on CPAP at 14cm H2O   PAF (paroxysmal atrial fibrillation) (HCC)    s/p PVI Ablation in 2018 // Flecainide Rx // Echo 8/21: EF 60-65, no RWMA, mild LVH, normal RV SF, moderate RVE, mild BAE, trivial MR, mild dilation of aortic root (40 mm)   Pneumonia    "may have had walking pneumonia a few years ago" (04/05/2017)   Pre-diabetes    Thrombophlebitis     SURGICAL HISTORY: Past Surgical History:  Procedure Laterality Date   ATRIAL FIBRILLATION ABLATION  04/05/2017   ATRIAL FIBRILLATION ABLATION N/A 04/05/2017   Procedure: ATRIAL FIBRILLATION ABLATION;    Surgeon: Thompson Grayer, MD;  Location: [facility];  Service: Cardiovascular;  Laterality: N/A;   BONE BIOPSY Right 12/17/2021   Procedure: BONE BIOPSY;  Surgeon: Hiram Gash, MD;  Location: [facility];  Service: Orthopedics;  Laterality: Right;   COMPLETE RIGHT HIP REPLACMENT  01/19/2019   EVALUATION UNDER ANESTHESIA WITH HEMORRHOIDECTOMY N/A 02/24/2017   Procedure: EXAM UNDER ANESTHESIA WITH  HEMORRHOIDECTOMY;  Surgeon: Michael Boston, MD;  Location:  [facility];  Service: General;  Laterality: N/A;   EXCISIONAL TOTAL HIP ARTHROPLASTY WITH ANTIBIOTIC SPACERS Right 09/25/2020   Procedure: EXCISIONAL TOTAL HIP ARTHROPLASTY WITH ANTIBIOTIC SPACERS;  Surgeon: Paralee Cancel, MD;  Location: [facility];  Service: Orthopedics;  Laterality: Right;   FLEXIBLE SIGMOIDOSCOPY N/A 04/15/2015   Procedure:  UNSEDATED FLEXIBLE SIGMOIDOSCOPY;  Surgeon: [name], MD;  Location: [facility];  Service: Endoscopy;  Laterality: N/A;   HUMERUS IM NAIL Right 12/17/2021   Procedure: INTRAMEDULLARY (IM) NAIL HUMERAL;  Surgeon: Hiram Gash, MD;  Location: [facility];  Service: Orthopedics;  Laterality: Right;   KNEE ARTHROSCOPY Left ~ 2016   REIMPLANTATION OF TOTAL HIP Right 12/25/2020   Procedure: REIMPLANTATION/REVISION OF RIGHT TOTAL HIP VERSUS REPEAT IRRIGATION AND DEBRIDEMENT;  Surgeon: Paralee Cancel, MD;  Location: [facility];  Service: Orthopedics;  Laterality: Right;   TESTICLE SURGERY  1988   VARICOCELE   TONSILLECTOMY     VARICOSE VEIN SURGERY Bilateral     SOCIAL HISTORY: Social History   Socioeconomic History   Marital status: Married    Spouse name: Not on file   Number of children: 1   Years of education: law school   Highest education level: Not on file  Occupational History   Occupation: lawyer  Tobacco Use   Smoking status: Never   Smokeless tobacco: Never  Vaping Use   Vaping Use: Never used  Substance and Sexual Activity   Alcohol use: Yes    Alcohol/week: 2.0 standard drinks of alcohol    Types: 2 Glasses of wine per week    Comment: 2 glasses of wine a week, occ bourbon, beer   Drug use: No   Sexual activity: Not Currently  Other Topics Concern   Not on file  Social History Narrative   Lives in [place] with spouse in a 2 story home.  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    Social Determinants of Health   Financial Resource Strain: Not on file   Food Insecurity: Not on file  Transportation Needs: Not on file  Physical Activity: Not on file  Stress: Not on file  Social Connections: Not on file  Intimate Partner Violence: Not on file    FAMILY HISTORY: Family History  Problem Relation Age of Onset   Dementia Mother    Stroke Father    Atrial fibrillation Father    Hypertension Father    Hypertension Paternal Grandfather    Dementia Maternal Grandmother     ALLERGIES:  is allergic to linezolid and vancomycin.  MEDICATIONS:  Current Outpatient Medications  Medication Sig Dispense Refill   Acetaminophen (TYLENOL EXTRA STRENGTH PO) Take 2 tablets by mouth as needed (For pain).     acyclovir (ZOVIRAX) 400 MG tablet Take 1 tablet (400 mg total) by mouth 2 (two) times daily. 180 tablet 3   amLODipine (NORVASC) 10 MG tablet Take 10 mg by mouth daily.  apixaban (ELIQUIS) 5 MG TABS tablet TAKE ONE TABLET BY MOUTH TWICE A DAY 60 tablet 6   Ascorbic Acid (VITAMIN C) 100 MG tablet Take 100 mg by mouth daily.     cholecalciferol (VITAMIN D3) 25 MCG (1000 UNIT) tablet Take 1,000 Units by mouth daily.     clobetasol cream (TEMOVATE) 0.05 % Apply 1 Application topically as needed.     Cyanocobalamin (VITAMIN B-12 PO) Take 1 tablet by mouth daily. Not sure the dosage     dexamethasone (DECADRON) 4 MG tablet Take 10 tablets (40 mg total) by mouth once a week. 40 tablet 5   Emollient (UDDERLY SMOOTH) CREA Apply topically daily.     flecainide (TAMBOCOR) 50 MG tablet TAKE ONE AND ONE-HALF TABLETS BY MOUTH TWICE A DAY (Patient taking differently: Take 100 tablets by mouth once.) 270 tablet 3   fluticasone (FLONASE) 50 MCG/ACT nasal spray Place 1 spray into both nostrils daily as needed for allergies or rhinitis.     folic acid (FOLVITE) 1 MG tablet Take 1 mg by mouth daily.     furosemide (LASIX) 40 MG tablet Take 1 tablet (40 mg) by mouth every other day alternating with 1 and 1/2 tablets (60 mg) on the opposite days. (Patient taking  differently: Take 40 mg by mouth as needed for fluid or edema. Take 1 tablet (40 mg) by mouth every other day alternating with 1 and 1/2 tablets (60 mg) on the opposite days.) 113 tablet 3   gabapentin (NEURONTIN) 300 MG capsule TAKE TWO CAPSULES BY MOUTH TWICE A DAY 360 capsule 0   hydrALAZINE (APRESOLINE) 10 MG tablet Take 1 tablet (10 mg total) by mouth 2 (two) times daily. AS NEEDED FOR HIGH BP 180 tablet 3   hydrochlorothiazide (HYDRODIURIL) 25 MG tablet TAKE ONE TABLET BY MOUTH ONE TIME DAILY 90 tablet 3   lenalidomide (REVLIMID) 25 MG capsule Take 1 capsule (25 mg total) by mouth daily. Take 21 days on, 7 days off, repeat every 28 days. [employer] auth # [id number] Date obtained: 01/20/22/23 21 capsule 0   lisinopril (ZESTRIL) 40 MG tablet TAKE ONE TABLET BY MOUTH ONE TIME DAILY 90 tablet 3   metoprolol succinate (TOPROL-XL) 50 MG 24 hr tablet Take 0.5 tablets (25 mg total) by mouth daily. (Patient taking differently: Take 50 mg by mouth daily.) 90 tablet 3   oxymetazoline (AFRIN) 0.05 % nasal spray Place 1 spray into both nostrils 2 (two) times daily as needed for congestion.     potassium chloride SA (KLOR-CON M) 20 MEQ tablet TAKE ONE TABLET BY MOUTH ONE TIME DAILY, TAKE 40 MG (2 TABLETS) ON DAYS THAT YOU ALSO TAKE LASIX (FUROSEMIDE) 135 tablet 3   prochlorperazine (COMPAZINE) 10 MG tablet Take 1 tablet (10 mg total) by mouth every 6 (six) hours as needed for nausea or vomiting. 30 tablet 0   psyllium (METAMUCIL) 58.6 % packet Take 1 packet by mouth as needed.     sodium chloride (OCEAN) 0.65 % SOLN nasal spray Place 1 spray into both nostrils as needed for congestion.     triamcinolone cream (KENALOG) 0.1 % Apply 1 application topically daily as needed (itching).     No current facility-administered medications for this visit.    REVIEW OF SYSTEMS:   Constitutional: ( - ) fevers, ( - )  chills , ( - ) night sweats Eyes: ( - ) blurriness of vision, ( - ) double vision, ( - ) watery eyes Ears,  nose, mouth, throat, and   face: ( - ) mucositis, ( - ) sore throat Respiratory: ( - ) cough, ( - ) dyspnea, ( - ) wheezes Cardiovascular: ( - ) palpitation, ( - ) chest discomfort, ( - ) lower extremity swelling Gastrointestinal:  ( - ) nausea, ( - ) heartburn, ( - ) change in bowel habits Skin: ( - ) abnormal skin rashes Lymphatics: ( - ) new lymphadenopathy, ( - ) easy bruising Neurological: ( - ) numbness, ( - ) tingling, ( - ) new weaknesses Behavioral/Psych: ( - ) mood change, ( - ) new changes  All other systems were reviewed with the patient and are negative.  PHYSICAL EXAMINATION: ECOG PERFORMANCE STATUS: 1 - Symptomatic but completely ambulatory  Vitals:   01/26/22 1111  BP: 138/63  Pulse: (!) 56  Resp: 16  Temp: 97.8 F (36.6 C)  SpO2: 98%   Filed Weights   01/26/22 1111  Weight: 255 lb 4.8 oz (115.8 kg)    GENERAL: Well-appearing elderly Caucasian male, alert, no distress and comfortable SKIN: skin color, texture, turgor are normal, no rashes or significant lesions EYES: conjunctiva are pink and non-injected, sclera clear LUNGS: clear to auscultation and percussion with normal breathing effort HEART: regular rate & rhythm and no murmurs and no lower extremity edema Musculoskeletal: no cyanosis of digits and no clubbing  PSYCH: alert & oriented x 3, fluent speech NEURO: no focal motor/sensory deficits  LABORATORY DATA:  I have reviewed the data as listed    Latest Ref Rng & Units 01/26/2022   10:52 AM 01/18/2022    8:33 AM 01/12/2022    9:15 AM  CBC  WBC 4.0 - 10.5 K/uL 6.7  4.0  6.0   Hemoglobin 13.0 - 17.0 g/dL 14.1  13.6  14.5   Hematocrit 39.0 - 52.0 % 39.7  38.9  42.0   Platelets 150 - 400 K/uL 200  127  170        Latest Ref Rng & Units 01/26/2022   10:52 AM 01/18/2022    8:33 AM 01/12/2022    9:15 AM  CMP  Glucose 70 - 99 mg/dL 139  126  127   BUN 8 - 23 mg/dL 13  14  14   Creatinine 0.61 - 1.24 mg/dL 0.83  0.79  0.90   Sodium 135 - 145 mmol/L 139   139  142   Potassium 3.5 - 5.1 mmol/L 3.5  3.2  3.4   Chloride 98 - 111 mmol/L 104  104  105   CO2 22 - 32 mmol/L 28  24  30   Calcium 8.9 - 10.3 mg/dL 9.0  8.8  9.2   Total Protein 6.5 - 8.1 g/dL 6.2  5.8  5.8   Total Bilirubin 0.3 - 1.2 mg/dL 0.4  0.4  0.6   Alkaline Phos 38 - 126 U/L 109  127  111   AST 15 - 41 U/L 18  15  15   ALT 0 - 44 U/L 28  31  24     Lab Results  Component Value Date   MPROTEIN 0.1 (H) 01/05/2022   MPROTEIN 0.2 (H) 12/07/2021   MPROTEIN 0.3 (H) 04/10/2021   Lab Results  Component Value Date   KPAFRELGTCHN 1,438.6 (H) 01/05/2022   KPAFRELGTCHN 1,971.3 (H) 12/07/2021   KPAFRELGTCHN 580.5 (H) 04/10/2021   LAMBDASER 2.1 (L) 01/05/2022   LAMBDASER 5.5 (L) 12/07/2021   LAMBDASER 7.0 04/10/2021   KAPLAMBRATIO 685.05 (H) 01/05/2022   KAPLAMBRATIO 2,223.53 (H) 12/28/2021     KAPLAMBRATIO 358.42 (H) 12/07/2021     RADIOGRAPHIC STUDIES: No results found.  ASSESSMENT & PLAN Travis [name] 70 y.o. male with medical history significant for IgA Kappa Multiple Myeloma who presents for a follow up visit.   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  He meets diagnostic criteria by having a serum free light chain ratio greater than 100 and lytic lesions causing pathological fracture of the humerus.  Given this I would recommend we pursued Darzalex, Revlimid, and dexamethasone.  Given his advanced age I would not recommend transplantation, that this is something we can consider when it comes time to transition to maintenance therapy versus transplant.  The patient voices understanding of the plan moving forward.  # IgA Kappa Multiple Myeloma --Metastatic survey completed 12/15/2021 --Patient underwent fixation of his humerus on 12/17/2021, pathology confirmed plasmacytoma/plasma cell neoplasm.  --Cycle 1 Day 1 of  Darzalex, Revlimid, and dexamethasone started on  12/28/2021.  Plan: --labs today show white blood cell 6.7 hemoglobin 14.1, MCV 90.6 and platelets of 200 --initial restaging labs today with SPEP and SFLC --today is Cycle 2 Day 1 of Dara/Dex/Revlimid --RTC in 2 weeks with interval weekly treatments  #Left Rib Pain --will obtain X-ray of the ribs to assess for lytic lesion or fracture in left ribs. --known disease in right ribs --will contact Rad/onc with the results.    #Supportive Care -- chemotherapy education complete -- port placement not required -- zofran 8mg q8H PRN and compazine 10mg PO q6H for nausea -- acyclovir 400mg PO BID for VCZ prophylaxis -- Zometa/Xgeva treatment to start once dental clearance is obtained. -- Pain is managed right now with tylenol  Offered stronger pain medication if pain were to worsen.    Orders Placed This Encounter  Procedures   DG Ribs Bilateral    Standing Status:   Future    Number of Occurrences:   1    Standing Expiration Date:   01/27/2023    Order Specific Question:   Reason for Exam (SYMPTOM  OR DIAGNOSIS REQUIRED)    Answer:   active Multiple myeloma with pain in left ribs, request assess for lytic lesion    Order Specific Question:   Preferred imaging location?    Answer:   [facility]    All questions were answered. The patient knows to call the clinic with any problems, questions or concerns.  A total of more than 30 minutes were spent on this encounter with face-to-face time and non-face-to-face time, including preparing to see the patient, ordering tests and/or medications, counseling the patient and coordination of care as outlined above.   John T. Dorsey, MD Department of Hematology/Oncology [facility] Phone: [phone] Pager: [phone] Email: [email or web address]  01/26/2022 1:19 PM 

## 2022-01-26 NOTE — Progress Notes (Signed)
Patient took home dexamethasone at 9am this morning.

## 2022-01-27 ENCOUNTER — Telehealth: Payer: Self-pay | Admitting: *Deleted

## 2022-01-27 DIAGNOSIS — M84421D Pathological fracture, right humerus, subsequent encounter for fracture with routine healing: Secondary | ICD-10-CM | POA: Diagnosis not present

## 2022-01-27 DIAGNOSIS — M6281 Muscle weakness (generalized): Secondary | ICD-10-CM | POA: Diagnosis not present

## 2022-01-27 DIAGNOSIS — M25611 Stiffness of right shoulder, not elsewhere classified: Secondary | ICD-10-CM | POA: Diagnosis not present

## 2022-01-27 LAB — KAPPA/LAMBDA LIGHT CHAINS
Kappa free light chain: 341.6 mg/L — ABNORMAL HIGH (ref 3.3–19.4)
Kappa, lambda light chain ratio: 122 — ABNORMAL HIGH (ref 0.26–1.65)
Lambda free light chains: 2.8 mg/L — ABNORMAL LOW (ref 5.7–26.3)

## 2022-01-27 NOTE — Telephone Encounter (Signed)
TCT patient regarding recent rib xray. Spoke with him and advised that there is no clear evidence of a fracture or destruction by myeloma on the left ribcage. The right rib and arm showed the previously known disease.   Pt voiced understanding. He is aware of his appts next week.

## 2022-01-27 NOTE — Telephone Encounter (Signed)
-----   Message from Orson Slick, MD sent at 01/27/2022  8:47 AM EDT ----- Please let Travis [name] know that he X-ray of the left ribs did not show any clear evidence of a fracture or destruction by myeloma. The right rib and arm showed the previously known disease.   ----- Message ----- From: Interface, Rad Results In Sent: 01/27/2022   5:01 AM EDT To: Orson Slick, MD

## 2022-01-29 LAB — MULTIPLE MYELOMA PANEL, SERUM
Albumin SerPl Elph-Mcnc: 3.3 g/dL (ref 2.9–4.4)
Albumin/Glob SerPl: 1.6 (ref 0.7–1.7)
Alpha 1: 0.3 g/dL (ref 0.0–0.4)
Alpha2 Glob SerPl Elph-Mcnc: 0.8 g/dL (ref 0.4–1.0)
B-Globulin SerPl Elph-Mcnc: 0.8 g/dL (ref 0.7–1.3)
Gamma Glob SerPl Elph-Mcnc: 0.4 g/dL (ref 0.4–1.8)
Globulin, Total: 2.2 g/dL (ref 2.2–3.9)
IgA: 77 mg/dL (ref 61–437)
IgG (Immunoglobin G), Serum: 331 mg/dL — ABNORMAL LOW (ref 603–1613)
IgM (Immunoglobulin M), Srm: 8 mg/dL — ABNORMAL LOW (ref 20–172)
M Protein SerPl Elph-Mcnc: 0.1 g/dL — ABNORMAL HIGH
Total Protein ELP: 5.5 g/dL — ABNORMAL LOW (ref 6.0–8.5)

## 2022-02-01 ENCOUNTER — Telehealth: Payer: Self-pay | Admitting: *Deleted

## 2022-02-01 DIAGNOSIS — M6281 Muscle weakness (generalized): Secondary | ICD-10-CM | POA: Diagnosis not present

## 2022-02-01 DIAGNOSIS — M84421D Pathological fracture, right humerus, subsequent encounter for fracture with routine healing: Secondary | ICD-10-CM | POA: Diagnosis not present

## 2022-02-01 DIAGNOSIS — M25611 Stiffness of right shoulder, not elsewhere classified: Secondary | ICD-10-CM | POA: Diagnosis not present

## 2022-02-01 NOTE — Telephone Encounter (Signed)
TCT patient regarding recent lab results.  Spoke with him and advised that he has had an excellent response to therapy so far.  There is a considerable drop in his free light chain concentration.  I shared with him the marked decrease in light chains.  We will continue on therapy as planned. Per Dr. Lorenso Courier Pt voiced understanding.  He states he is doing ok  though still with pain in his right arm s/p surgery. He is doing PT. He is aware of his appts.

## 2022-02-01 NOTE — Telephone Encounter (Signed)
-----   Message from Orson Slick, MD sent at 01/31/2022  5:46 PM EDT ----- Please let Travis [name] know that he has had an excellent response to therapy so far.  There is a considerable drop in his free light chain concentration.  Please share with him the marked decrease in light chains.  We will continue on therapy as planned. ----- Message ----- From: Buel Ream, Lab In [place] Sent: 01/26/2022  11:11 AM EDT To: Orson Slick, MD

## 2022-02-02 ENCOUNTER — Inpatient Hospital Stay: Payer: Medicare Other | Attending: Hematology and Oncology

## 2022-02-02 ENCOUNTER — Other Ambulatory Visit: Payer: Self-pay

## 2022-02-02 ENCOUNTER — Inpatient Hospital Stay: Payer: Medicare Other

## 2022-02-02 VITALS — BP 146/66 | HR 54 | Temp 97.9°F | Resp 18 | Wt 254.3 lb

## 2022-02-02 DIAGNOSIS — M47814 Spondylosis without myelopathy or radiculopathy, thoracic region: Secondary | ICD-10-CM | POA: Diagnosis not present

## 2022-02-02 DIAGNOSIS — R0781 Pleurodynia: Secondary | ICD-10-CM | POA: Insufficient documentation

## 2022-02-02 DIAGNOSIS — Z818 Family history of other mental and behavioral disorders: Secondary | ICD-10-CM | POA: Insufficient documentation

## 2022-02-02 DIAGNOSIS — I10 Essential (primary) hypertension: Secondary | ICD-10-CM | POA: Diagnosis not present

## 2022-02-02 DIAGNOSIS — Z8249 Family history of ischemic heart disease and other diseases of the circulatory system: Secondary | ICD-10-CM | POA: Insufficient documentation

## 2022-02-02 DIAGNOSIS — R197 Diarrhea, unspecified: Secondary | ICD-10-CM | POA: Diagnosis not present

## 2022-02-02 DIAGNOSIS — I480 Paroxysmal atrial fibrillation: Secondary | ICD-10-CM | POA: Insufficient documentation

## 2022-02-02 DIAGNOSIS — D472 Monoclonal gammopathy: Secondary | ICD-10-CM

## 2022-02-02 DIAGNOSIS — C9000 Multiple myeloma not having achieved remission: Secondary | ICD-10-CM | POA: Diagnosis not present

## 2022-02-02 DIAGNOSIS — Z5112 Encounter for antineoplastic immunotherapy: Secondary | ICD-10-CM | POA: Diagnosis not present

## 2022-02-02 DIAGNOSIS — Z79624 Long term (current) use of inhibitors of nucleotide synthesis: Secondary | ICD-10-CM | POA: Insufficient documentation

## 2022-02-02 DIAGNOSIS — Z7901 Long term (current) use of anticoagulants: Secondary | ICD-10-CM | POA: Diagnosis not present

## 2022-02-02 DIAGNOSIS — Z823 Family history of stroke: Secondary | ICD-10-CM | POA: Insufficient documentation

## 2022-02-02 DIAGNOSIS — R21 Rash and other nonspecific skin eruption: Secondary | ICD-10-CM | POA: Diagnosis not present

## 2022-02-02 DIAGNOSIS — Z79899 Other long term (current) drug therapy: Secondary | ICD-10-CM | POA: Diagnosis not present

## 2022-02-02 DIAGNOSIS — Z881 Allergy status to other antibiotic agents status: Secondary | ICD-10-CM | POA: Diagnosis not present

## 2022-02-02 DIAGNOSIS — Z86718 Personal history of other venous thrombosis and embolism: Secondary | ICD-10-CM | POA: Diagnosis not present

## 2022-02-02 LAB — CBC WITH DIFFERENTIAL (CANCER CENTER ONLY)
Abs Immature Granulocytes: 0.01 10*3/uL (ref 0.00–0.07)
Basophils Absolute: 0.1 10*3/uL (ref 0.0–0.1)
Basophils Relative: 1 %
Eosinophils Absolute: 0.3 10*3/uL (ref 0.0–0.5)
Eosinophils Relative: 5 %
HCT: 41.1 % (ref 39.0–52.0)
Hemoglobin: 14.2 g/dL (ref 13.0–17.0)
Immature Granulocytes: 0 %
Lymphocytes Relative: 6 %
Lymphs Abs: 0.4 10*3/uL — ABNORMAL LOW (ref 0.7–4.0)
MCH: 32 pg (ref 26.0–34.0)
MCHC: 34.5 g/dL (ref 30.0–36.0)
MCV: 92.6 fL (ref 80.0–100.0)
Monocytes Absolute: 0.5 10*3/uL (ref 0.1–1.0)
Monocytes Relative: 7 %
Neutro Abs: 5.2 10*3/uL (ref 1.7–7.7)
Neutrophils Relative %: 81 %
Platelet Count: 204 10*3/uL (ref 150–400)
RBC: 4.44 MIL/uL (ref 4.22–5.81)
RDW: 14.4 % (ref 11.5–15.5)
WBC Count: 6.5 10*3/uL (ref 4.0–10.5)
nRBC: 0 % (ref 0.0–0.2)

## 2022-02-02 LAB — CMP (CANCER CENTER ONLY)
ALT: 30 U/L (ref 0–44)
AST: 16 U/L (ref 15–41)
Albumin: 4.1 g/dL (ref 3.5–5.0)
Alkaline Phosphatase: 129 U/L — ABNORMAL HIGH (ref 38–126)
Anion gap: 4 — ABNORMAL LOW (ref 5–15)
BUN: 14 mg/dL (ref 8–23)
CO2: 33 mmol/L — ABNORMAL HIGH (ref 22–32)
Calcium: 9.4 mg/dL (ref 8.9–10.3)
Chloride: 103 mmol/L (ref 98–111)
Creatinine: 0.79 mg/dL (ref 0.61–1.24)
GFR, Estimated: >60 mL/min (ref >60)
Glucose, Bld: 109 mg/dL — ABNORMAL HIGH (ref 70–99)
Potassium: 3.4 mmol/L — ABNORMAL LOW (ref 3.5–5.1)
Sodium: 140 mmol/L (ref 135–145)
Total Bilirubin: 0.5 mg/dL (ref 0.3–1.2)
Total Protein: 6.1 g/dL — ABNORMAL LOW (ref 6.5–8.1)

## 2022-02-02 LAB — LACTATE DEHYDROGENASE: LDH: 147 U/L (ref 98–192)

## 2022-02-02 MED ORDER — ACETAMINOPHEN 325 MG PO TABS
650.0000 mg | ORAL_TABLET | Freq: Once | ORAL | Status: AC
  Administered 2022-02-02: 650 mg via ORAL
  Filled 2022-02-02: qty 2

## 2022-02-02 MED ORDER — DARATUMUMAB-HYALURONIDASE-FIHJ 1800-30000 MG-UT/15ML ~~LOC~~ SOLN
1800.0000 mg | Freq: Once | SUBCUTANEOUS | Status: AC
  Administered 2022-02-02: 1800 mg via SUBCUTANEOUS
  Filled 2022-02-02: qty 15

## 2022-02-02 MED ORDER — DIPHENHYDRAMINE HCL 25 MG PO CAPS
50.0000 mg | ORAL_CAPSULE | Freq: Once | ORAL | Status: AC
  Administered 2022-02-02: 50 mg via ORAL
  Filled 2022-02-02: qty 2

## 2022-02-02 NOTE — Patient Instructions (Signed)
[facility]COL[facility]  Discharge Instructions: Thank you for choosing [facility] to provide your oncology and hematology care.   If you have a lab appointment with the Cancer Center, please go directly to the Cancer Center and check in at the registration area.   Wear comfortable clothing and clothing appropriate for easy access to any Portacath or PICC line.   We strive to give you quality time with your provider. You may need to reschedule your appointment if you arrive late (15 or more minutes).  Arriving late affects you and other patients whose appointments are after yours.  Also, if you miss three or more appointments without notifying the office, you may be dismissed from the clinic at the provider's discretion.      For prescription refill requests, have your pharmacy contact our office and allow 72 hours for refills to be completed.    Today you received the following chemotherapy and/or immunotherapy agents: Darzalex Faspro      To help prevent nausea and vomiting after your treatment, we encourage you to take your nausea medication as directed.  BELOW ARE SYMPTOMS THAT SHOULD BE REPORTED IMMEDIATELY: *FEVER GREATER THAN 100.4 F (38 C) OR HIGHER *CHILLS OR SWEATING *NAUSEA AND VOMITING THAT IS NOT CONTROLLED WITH YOUR NAUSEA MEDICATION *UNUSUAL SHORTNESS OF BREATH *UNUSUAL BRUISING OR BLEEDING *URINARY PROBLEMS (pain or burning when urinating, or frequent urination) *BOWEL PROBLEMS (unusual diarrhea, constipation, pain near the anus) TENDERNESS IN MOUTH AND THROAT WITH OR WITHOUT PRESENCE OF ULCERS (sore throat, sores in mouth, or a toothache) UNUSUAL RASH, SWELLING OR PAIN  UNUSUAL VAGINAL DISCHARGE OR ITCHING   Items with * indicate a potential emergency and should be followed up as soon as possible or go to the Emergency Department if any problems should occur.  Please show the CHEMOTHERAPY ALERT CARD or IMMUNOTHERAPY ALERT CARD at  check-in to the Emergency Department and triage nurse.  Should you have questions after your visit or need to cancel or reschedule your appointment, please contact [facility]  Dept: [phone]  and follow the prompts.  Office hours are 8:00 a.m. to 4:30 p.m. Monday - Friday. Please note that voicemails left after 4:00 p.m. may not be returned until the following business day.  We are closed weekends and major holidays. You have access to a nurse at all times for urgent questions. Please call the main number to the clinic Dept: [phone] and follow the prompts.   For any non-urgent questions, you may also contact your provider using MyChart. We now offer e-Visits for anyone 18 and older to request care online for non-urgent symptoms. For details visit [email or web address].   Also download the MyChart app! Go to the app store, search "MyChart", open the app, select [facility], and log in with your MyChart username and password.  Masks are optional in the cancer centers. If you would like for your care team to wear a mask while they are taking care of you, please let them know. You may have one support person who is at least 71 years old accompany you for your appointments. 

## 2022-02-03 LAB — KAPPA/LAMBDA LIGHT CHAINS
Kappa free light chain: 359.5 mg/L — ABNORMAL HIGH (ref 3.3–19.4)
Kappa, lambda light chain ratio: 133.15 — ABNORMAL HIGH (ref 0.26–1.65)
Lambda free light chains: 2.7 mg/L — ABNORMAL LOW (ref 5.7–26.3)

## 2022-02-05 DIAGNOSIS — M6281 Muscle weakness (generalized): Secondary | ICD-10-CM | POA: Diagnosis not present

## 2022-02-05 DIAGNOSIS — M84421D Pathological fracture, right humerus, subsequent encounter for fracture with routine healing: Secondary | ICD-10-CM | POA: Diagnosis not present

## 2022-02-05 DIAGNOSIS — M25611 Stiffness of right shoulder, not elsewhere classified: Secondary | ICD-10-CM | POA: Diagnosis not present

## 2022-02-08 DIAGNOSIS — M25611 Stiffness of right shoulder, not elsewhere classified: Secondary | ICD-10-CM | POA: Diagnosis not present

## 2022-02-08 DIAGNOSIS — M6281 Muscle weakness (generalized): Secondary | ICD-10-CM | POA: Diagnosis not present

## 2022-02-08 DIAGNOSIS — M84421D Pathological fracture, right humerus, subsequent encounter for fracture with routine healing: Secondary | ICD-10-CM | POA: Diagnosis not present

## 2022-02-08 LAB — MULTIPLE MYELOMA PANEL, SERUM
Albumin SerPl Elph-Mcnc: 3.4 g/dL (ref 2.9–4.4)
Albumin/Glob SerPl: 1.5 (ref 0.7–1.7)
Alpha 1: 0.3 g/dL (ref 0.0–0.4)
Alpha2 Glob SerPl Elph-Mcnc: 0.8 g/dL (ref 0.4–1.0)
B-Globulin SerPl Elph-Mcnc: 1 g/dL (ref 0.7–1.3)
Gamma Glob SerPl Elph-Mcnc: 0.3 g/dL — ABNORMAL LOW (ref 0.4–1.8)
Globulin, Total: 2.3 g/dL (ref 2.2–3.9)
IgA: 64 mg/dL (ref 61–437)
IgG (Immunoglobin G), Serum: 315 mg/dL — ABNORMAL LOW (ref 603–1613)
IgM (Immunoglobulin M), Srm: 9 mg/dL — ABNORMAL LOW (ref 20–172)
M Protein SerPl Elph-Mcnc: 0.1 g/dL — ABNORMAL HIGH
Total Protein ELP: 5.7 g/dL — ABNORMAL LOW (ref 6.0–8.5)

## 2022-02-09 ENCOUNTER — Inpatient Hospital Stay: Payer: Medicare Other

## 2022-02-09 ENCOUNTER — Inpatient Hospital Stay (HOSPITAL_BASED_OUTPATIENT_CLINIC_OR_DEPARTMENT_OTHER): Payer: Medicare Other | Admitting: Hematology and Oncology

## 2022-02-09 ENCOUNTER — Encounter: Payer: Self-pay | Admitting: Hematology and Oncology

## 2022-02-09 ENCOUNTER — Other Ambulatory Visit: Payer: Self-pay

## 2022-02-09 VITALS — BP 135/65 | HR 56 | Temp 98.2°F | Resp 15 | Wt 253.8 lb

## 2022-02-09 DIAGNOSIS — I10 Essential (primary) hypertension: Secondary | ICD-10-CM | POA: Diagnosis not present

## 2022-02-09 DIAGNOSIS — I480 Paroxysmal atrial fibrillation: Secondary | ICD-10-CM | POA: Diagnosis not present

## 2022-02-09 DIAGNOSIS — Z7901 Long term (current) use of anticoagulants: Secondary | ICD-10-CM | POA: Diagnosis not present

## 2022-02-09 DIAGNOSIS — Z823 Family history of stroke: Secondary | ICD-10-CM | POA: Diagnosis not present

## 2022-02-09 DIAGNOSIS — Z881 Allergy status to other antibiotic agents status: Secondary | ICD-10-CM | POA: Diagnosis not present

## 2022-02-09 DIAGNOSIS — R21 Rash and other nonspecific skin eruption: Secondary | ICD-10-CM | POA: Diagnosis not present

## 2022-02-09 DIAGNOSIS — C9000 Multiple myeloma not having achieved remission: Secondary | ICD-10-CM

## 2022-02-09 DIAGNOSIS — M47814 Spondylosis without myelopathy or radiculopathy, thoracic region: Secondary | ICD-10-CM | POA: Diagnosis not present

## 2022-02-09 DIAGNOSIS — Z79899 Other long term (current) drug therapy: Secondary | ICD-10-CM | POA: Diagnosis not present

## 2022-02-09 DIAGNOSIS — R197 Diarrhea, unspecified: Secondary | ICD-10-CM | POA: Diagnosis not present

## 2022-02-09 DIAGNOSIS — Z86718 Personal history of other venous thrombosis and embolism: Secondary | ICD-10-CM | POA: Diagnosis not present

## 2022-02-09 DIAGNOSIS — Z5112 Encounter for antineoplastic immunotherapy: Secondary | ICD-10-CM | POA: Diagnosis not present

## 2022-02-09 DIAGNOSIS — R0781 Pleurodynia: Secondary | ICD-10-CM | POA: Diagnosis not present

## 2022-02-09 DIAGNOSIS — Z79624 Long term (current) use of inhibitors of nucleotide synthesis: Secondary | ICD-10-CM | POA: Diagnosis not present

## 2022-02-09 DIAGNOSIS — Z8249 Family history of ischemic heart disease and other diseases of the circulatory system: Secondary | ICD-10-CM | POA: Diagnosis not present

## 2022-02-09 LAB — CBC WITH DIFFERENTIAL (CANCER CENTER ONLY)
Abs Immature Granulocytes: 0.04 10*3/uL (ref 0.00–0.07)
Basophils Absolute: 0 10*3/uL (ref 0.0–0.1)
Basophils Relative: 0 %
Eosinophils Absolute: 0.6 10*3/uL — ABNORMAL HIGH (ref 0.0–0.5)
Eosinophils Relative: 7 %
HCT: 39.1 % (ref 39.0–52.0)
Hemoglobin: 13.5 g/dL (ref 13.0–17.0)
Immature Granulocytes: 1 %
Lymphocytes Relative: 2 %
Lymphs Abs: 0.2 10*3/uL — ABNORMAL LOW (ref 0.7–4.0)
MCH: 31.8 pg (ref 26.0–34.0)
MCHC: 34.5 g/dL (ref 30.0–36.0)
MCV: 92.2 fL (ref 80.0–100.0)
Monocytes Absolute: 0.4 10*3/uL (ref 0.1–1.0)
Monocytes Relative: 5 %
Neutro Abs: 7 10*3/uL (ref 1.7–7.7)
Neutrophils Relative %: 85 %
Platelet Count: 168 10*3/uL (ref 150–400)
RBC: 4.24 MIL/uL (ref 4.22–5.81)
RDW: 14.4 % (ref 11.5–15.5)
WBC Count: 8.2 10*3/uL (ref 4.0–10.5)
nRBC: 0 % (ref 0.0–0.2)

## 2022-02-09 LAB — CMP (CANCER CENTER ONLY)
ALT: 24 U/L (ref 0–44)
AST: 13 U/L — ABNORMAL LOW (ref 15–41)
Albumin: 3.7 g/dL (ref 3.5–5.0)
Alkaline Phosphatase: 112 U/L (ref 38–126)
Anion gap: 5 (ref 5–15)
BUN: 15 mg/dL (ref 8–23)
CO2: 33 mmol/L — ABNORMAL HIGH (ref 22–32)
Calcium: 8.9 mg/dL (ref 8.9–10.3)
Chloride: 103 mmol/L (ref 98–111)
Creatinine: 0.79 mg/dL (ref 0.61–1.24)
GFR, Estimated: >60 mL/min (ref >60)
Glucose, Bld: 112 mg/dL — ABNORMAL HIGH (ref 70–99)
Potassium: 3.6 mmol/L (ref 3.5–5.1)
Sodium: 141 mmol/L (ref 135–145)
Total Bilirubin: 0.5 mg/dL (ref 0.3–1.2)
Total Protein: 6.1 g/dL — ABNORMAL LOW (ref 6.5–8.1)

## 2022-02-09 LAB — LACTATE DEHYDROGENASE: LDH: 141 U/L (ref 98–192)

## 2022-02-09 MED ORDER — VALACYCLOVIR HCL 1 G PO TABS: 1000.0000 mg | ORAL_TABLET | Freq: Three times a day (TID) | ORAL | 0 refills | Status: AC

## 2022-02-09 NOTE — Progress Notes (Signed)
[place] [phone]) [phone]   [phone]  PROGRESS NOTE  Patient Care Team: Lajean Manes, MD as PCP - General (Internal Medicine) Fay Records, MD as PCP - Cardiology (Cardiology) Sueanne Margarita, MD as PCP - Sleep Medicine (Cardiology) Thompson Grayer, MD as PCP - Electrophysiology (Cardiology) Michael Boston, MD as Consulting Physician (General Surgery) Clarene Essex, MD as Consulting Physician (Gastroenterology) Sueanne Margarita, MD as Consulting Physician (Sleep Medicine) Alda Berthold, DO as Consulting Physician (Neurology)  Hematological/Oncological History # IgA Kappa Multiple Myeloma 05/05/2020: Bone marrow biopsy showed increased number of plasma cells representing 14% of all cells in the aspirate associated with numerous variably sized clusters in the clot and biopsy sections 04/10/2021: M protein 0.3, IgA kappa specificity. Kappa 580.5, Labda 7.0, ratio 82.93.   12/07/2021: Labs show of 1971.3, lambda 5.5, ratio 358.42 12/10/2021: Transition care to Dr. Lorenso Courier. 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  01/26/2022: Cycle 2 Day 1 of Dara/Rev/Dex  Interval History:  Travis [name] 71 y.o. male with medical history significant for IgA Kappa Multiple Myeloma who presents for a follow up visit. The patient's last visit was on 01/26/2022. In the interim since the last visit he has continued of Dara/Rev/Dex.  On exam today Travis [name] is accompanied by his wife.  He reports he unfortunately has developed a rash in the interim since her last visit.  It is on the right side of his back in a dermatomal distribution that does not cross the midline.  He reports it has been itchy and mostly painful.  He has had shingles before in the past and the rash does feel like his prior episodes of shingles.  He notes that physical therapy is working well  and improving his mobility.  Unfortunately he is having some difficulty with sleeping on the days where he takes steroids.  Other than the rash she has been doing quite well.  He is not having any issues with nausea, vomiting, or diarrhea.  His appetite has been good and his weight is stable.  He denies any fevers, chills, sweats, shortness of breath, chest pain.  A full 10 point ROS is listed below.  MEDICAL HISTORY:  Past Medical History:  Diagnosis Date   Arthritis    "comes and goes; mostly in hands, occasionally elbow" (57/07/2200)   Complication of anesthesia    "just wanted to sleep alot  for couple days S/P VARICOCELE OR" (04/05/2017)   DVT (deep venous thrombosis) (HCC)    LLE   Dysrhythmia    PVCs    GERD (gastroesophageal reflux disease)    History of hiatal hernia    Hypertension    Impaired glucose tolerance    Multiple myeloma (HCC)    Obesity (BMI 30-39.9) 07/26/2016   OSA on CPAP 07/26/2016   Mild with AHI 12.5/hr now on CPAP at 14cm H2O   PAF (paroxysmal atrial fibrillation) ([place])    s/p PVI Ablation in 2018 // Flecainide Rx // Echo 8/21: EF 60-65, no RWMA, mild LVH, normal RV SF, moderate RVE, mild BAE, trivial MR, mild dilation of aortic root (40 mm)   Pneumonia    "may have had walking pneumonia a few years ago" (04/05/2017)   Pre-diabetes    Thrombophlebitis     SURGICAL HISTORY: Past Surgical History:  Procedure Laterality Date   ATRIAL FIBRILLATION ABLATION  04/05/2017   ATRIAL FIBRILLATION ABLATION  N/A 04/05/2017   Procedure: ATRIAL FIBRILLATION ABLATION;  Surgeon: Thompson Grayer, MD;  Location: [facility];  Service: Cardiovascular;  Laterality: N/A;   BONE BIOPSY Right 12/17/2021   Procedure: BONE BIOPSY;  Surgeon: Hiram Gash, MD;  Location: [facility];  Service: Orthopedics;  Laterality: Right;   COMPLETE RIGHT HIP REPLACMENT  01/19/2019   EVALUATION UNDER ANESTHESIA WITH HEMORRHOIDECTOMY N/A 02/24/2017   Procedure: EXAM UNDER  ANESTHESIA WITH  HEMORRHOIDECTOMY;  Surgeon: Michael Boston, MD;  Location: [facility];  Service: General;  Laterality: N/A;   EXCISIONAL TOTAL HIP ARTHROPLASTY WITH ANTIBIOTIC SPACERS Right 09/25/2020   Procedure: EXCISIONAL TOTAL HIP ARTHROPLASTY WITH ANTIBIOTIC SPACERS;  Surgeon: Paralee Cancel, MD;  Location: [facility];  Service: Orthopedics;  Laterality: Right;   FLEXIBLE SIGMOIDOSCOPY N/A 04/15/2015   Procedure:  UNSEDATED FLEXIBLE SIGMOIDOSCOPY;  Surgeon: [name], MD;  Location: [facility];  Service: Endoscopy;  Laterality: N/A;   HUMERUS IM NAIL Right 12/17/2021   Procedure: INTRAMEDULLARY (IM) NAIL HUMERAL;  Surgeon: Hiram Gash, MD;  Location: [facility];  Service: Orthopedics;  Laterality: Right;   KNEE ARTHROSCOPY Left ~ 2016   REIMPLANTATION OF TOTAL HIP Right 12/25/2020   Procedure: REIMPLANTATION/REVISION OF RIGHT TOTAL HIP VERSUS REPEAT IRRIGATION AND DEBRIDEMENT;  Surgeon: Paralee Cancel, MD;  Location: [facility];  Service: Orthopedics;  Laterality: Right;   TESTICLE SURGERY  1988   VARICOCELE   TONSILLECTOMY     VARICOSE VEIN SURGERY Bilateral     SOCIAL HISTORY: Social History   Socioeconomic History   Marital status: Married    Spouse name: Not on file   Number of children: 1   Years of education: law school   Highest education level: Not on file  Occupational History   Occupation: lawyer  Tobacco Use   Smoking status: Never   Smokeless tobacco: Never  Vaping Use   Vaping Use: Never used  Substance and Sexual Activity   Alcohol use: Yes    Alcohol/week: 2.0 standard drinks of alcohol    Types: 2 Glasses of wine per week    Comment: 2 glasses of wine a week, occ bourbon, beer   Drug use: No   Sexual activity: Not Currently  Other Topics Concern   Not on file  Social History Narrative   Lives in [place] with spouse in a 2 story home.  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     Social Determinants of Health   Financial Resource Strain: Not on file  Food Insecurity: Not on file  Transportation Needs: Not on file  Physical Activity: Not on file  Stress: Not on file  Social Connections: Not on file  Intimate Partner Violence: Not on file    FAMILY HISTORY: Family History  Problem Relation Age of Onset   Dementia Mother    Stroke Father    Atrial fibrillation Father    Hypertension Father    Hypertension Paternal Grandfather    Dementia Maternal Grandmother     ALLERGIES:  is allergic to linezolid and vancomycin.  MEDICATIONS:  Current Outpatient Medications  Medication Sig Dispense Refill   valACYclovir (VALTREX) 1000 MG tablet Take 1 tablet (1,000 mg total) by mouth 3 (three) times daily for 10 days. 30 tablet 0   Acetaminophen (TYLENOL EXTRA STRENGTH PO) Take 2 tablets by mouth as needed (For pain).     acyclovir (ZOVIRAX) 400 MG  tablet Take 1 tablet (400 mg total) by mouth 2 (two) times daily. 180 tablet 3   amLODipine (NORVASC) 10 MG tablet Take 10 mg by mouth daily.      apixaban (ELIQUIS) 5 MG TABS tablet TAKE ONE TABLET BY MOUTH TWICE A DAY 60 tablet 6   Ascorbic Acid (VITAMIN C) 100 MG tablet Take 100 mg by mouth daily.     cholecalciferol (VITAMIN D3) 25 MCG (1000 UNIT) tablet Take 1,000 Units by mouth daily.     clobetasol cream (TEMOVATE) 0.16 % Apply 1 Application topically as needed.     Cyanocobalamin (VITAMIN B-12 PO) Take 1 tablet by mouth daily. Not sure the dosage     dexamethasone (DECADRON) 4 MG tablet Take 10 tablets (40 mg total) by mouth once a week. 40 tablet 5   Emollient (UDDERLY SMOOTH) CREA Apply topically daily.     flecainide (TAMBOCOR) 50 MG tablet TAKE ONE AND ONE-HALF TABLETS BY MOUTH TWICE A DAY (Patient taking differently: Take 100 tablets by mouth once.) 270 tablet 3   fluticasone (FLONASE) 50 MCG/ACT nasal spray Place 1 spray into both nostrils daily as needed for allergies or rhinitis.     folic acid (FOLVITE) 1  MG tablet Take 1 mg by mouth daily.     furosemide (LASIX) 40 MG tablet Take 1 tablet (40 mg) by mouth every other day alternating with 1 and 1/2 tablets (60 mg) on the opposite days. (Patient taking differently: Take 40 mg by mouth as needed for fluid or edema. Take 1 tablet (40 mg) by mouth every other day alternating with 1 and 1/2 tablets (60 mg) on the opposite days.) 113 tablet 3   gabapentin (NEURONTIN) 300 MG capsule TAKE TWO CAPSULES BY MOUTH TWICE A DAY 360 capsule 0   hydrALAZINE (APRESOLINE) 10 MG tablet Take 1 tablet (10 mg total) by mouth 2 (two) times daily. AS NEEDED FOR HIGH BP 180 tablet 3   hydrochlorothiazide (HYDRODIURIL) 25 MG tablet TAKE ONE TABLET BY MOUTH ONE TIME DAILY 90 tablet 3   lenalidomide (REVLIMID) 25 MG capsule Take 1 capsule (25 mg total) by mouth daily. Take 21 days on, 7 days off, repeat every 28 days. [employer] auth # [id number] Date obtained: 01/20/22/23 21 capsule 0   lisinopril (ZESTRIL) 40 MG tablet TAKE ONE TABLET BY MOUTH ONE TIME DAILY 90 tablet 3   metoprolol succinate (TOPROL-XL) 50 MG 24 hr tablet Take 0.5 tablets (25 mg total) by mouth daily. (Patient taking differently: Take 50 mg by mouth daily.) 90 tablet 3   oxymetazoline (AFRIN) 0.05 % nasal spray Place 1 spray into both nostrils 2 (two) times daily as needed for congestion.     potassium chloride SA (KLOR-CON M) 20 MEQ tablet TAKE ONE TABLET BY MOUTH ONE TIME DAILY, TAKE 40 MG (2 TABLETS) ON DAYS THAT YOU ALSO TAKE LASIX (FUROSEMIDE) 135 tablet 3   prochlorperazine (COMPAZINE) 10 MG tablet Take 1 tablet (10 mg total) by mouth every 6 (six) hours as needed for nausea or vomiting. 30 tablet 0   psyllium (METAMUCIL) 58.6 % packet Take 1 packet by mouth as needed.     sodium chloride (OCEAN) 0.65 % SOLN nasal spray Place 1 spray into both nostrils as needed for congestion.     triamcinolone cream (KENALOG) 0.1 % Apply 1 application topically daily as needed (itching).     No current facility-administered  medications for this visit.    REVIEW OF SYSTEMS:   Constitutional: ( - )  fevers, ( - )  chills , ( - ) night sweats Eyes: ( - ) blurriness of vision, ( - ) double vision, ( - ) watery eyes Ears, nose, mouth, throat, and face: ( - ) mucositis, ( - ) sore throat Respiratory: ( - ) cough, ( - ) dyspnea, ( - ) wheezes Cardiovascular: ( - ) palpitation, ( - ) chest discomfort, ( - ) lower extremity swelling Gastrointestinal:  ( - ) nausea, ( - ) heartburn, ( - ) change in bowel habits Skin: ( - ) abnormal skin rashes Lymphatics: ( - ) new lymphadenopathy, ( - ) easy bruising Neurological: ( - ) numbness, ( - ) tingling, ( - ) new weaknesses Behavioral/Psych: ( - ) mood change, ( - ) new changes  All other systems were reviewed with the patient and are negative.  PHYSICAL EXAMINATION: ECOG PERFORMANCE STATUS: 1 - Symptomatic but completely ambulatory  Vitals:   02/09/22 1040  BP: 135/65  Pulse: (!) 56  Resp: 15  Temp: 98.2 F (36.8 C)  SpO2: 97%   Filed Weights   02/09/22 1040  Weight: 253 lb 12.8 oz (115.1 kg)    GENERAL: Well-appearing elderly Caucasian male, alert, no distress and comfortable SKIN: Rash in a dermatomal distribution that does not cross the midline.  Located at the right upper back.  It is blistering and not draining and mostly crusted. EYES: conjunctiva are pink and non-injected, sclera clear LUNGS: clear to auscultation and percussion with normal breathing effort HEART: regular rate & rhythm and no murmurs and no lower extremity edema Musculoskeletal: no cyanosis of digits and no clubbing  PSYCH: alert & oriented x 3, fluent speech NEURO: no focal motor/sensory deficits  LABORATORY DATA:  I have reviewed the data as listed    Latest Ref Rng & Units 02/09/2022    9:54 AM 02/02/2022    8:44 AM 01/26/2022   10:52 AM  CBC  WBC 4.0 - 10.5 K/uL 8.2  6.5  6.7   Hemoglobin 13.0 - 17.0 g/dL 13.5  14.2  14.1   Hematocrit 39.0 - 52.0 % 39.1  41.1  39.7    Platelets 150 - 400 K/uL 168  204  200        Latest Ref Rng & Units 02/09/2022    9:54 AM 02/02/2022    8:44 AM 01/26/2022   10:52 AM  CMP  Glucose 70 - 99 mg/dL 112  109  139   BUN 8 - 23 mg/dL _0 Creatinine 0.61 - 1.24 mg/dL 0.79  0.79  0.83   Sodium 135 - 145 mmol/L 141  140  139   Potassium 3.5 - 5.1 mmol/L 3.6  3.4  3.5   Chloride 98 - 111 mmol/L 103  103  104   CO2 22 - 32 mmol/L 33  33  28   Calcium 8.9 - 10.3 mg/dL 8.9  9.4  9.0   Total Protein 6.5 - 8.1 g/dL 6.1  6.1  6.2   Total Bilirubin 0.3 - 1.2 mg/dL 0.5  0.5  0.4   Alkaline Phos 38 - 126 U/L 112  129  109   AST 15 - 41 U/L _1 ALT 0 - 44 U/L _2 Lab Results  Component Value Date   MPROTEIN 0.1 (H) 02/02/2022   MPROTEIN 0.1 (H) 01/26/2022   MPROTEIN 0.1 (H) 01/05/2022   Lab Results  Component Value Date   KPAFRELGTCHN 359.5 (H) 02/02/2022   KPAFRELGTCHN 341.6 (H) 01/26/2022   KPAFRELGTCHN 1,438.6 (H) 01/05/2022   LAMBDASER 2.7 (L) 02/02/2022   LAMBDASER 2.8 (L) 01/26/2022   LAMBDASER 2.1 (L) 01/05/2022   KAPLAMBRATIO 133.15 (H) 02/02/2022   KAPLAMBRATIO 122.00 (H) 01/26/2022   KAPLAMBRATIO 685.05 (H) 01/05/2022     RADIOGRAPHIC STUDIES: DG Ribs Bilateral  Result Date: 01/27/2022 CLINICAL DATA:  Active multiple myeloma with left rib pain. EXAM: BILATERAL RIBS - 3+ VIEW COMPARISON:  10/27/2021. FINDINGS: No acute fracture is identified. There is absence of the T6 rib on the right, increased in size from the prior exam. An irregular lucency with cortical destruction is noted in the proximal right humerus with hardware in place. Degenerative changes are present in the thoracic spine. IMPRESSION: 1. Lucency and discontinuity of the T6 rib on the right, concerning for multiple myeloma. 2. No acute cardiopulmonary process. 3. Destructive lucent lesion in the proximal right humerus with hardware fixation. Electronically Signed   By: Brett Fairy M.D.   On: 01/27/2022 04:58     ASSESSMENT & PLAN Travis [name] 71 y.o. male with medical history significant for IgA Kappa Multiple Myeloma who presents for a follow up visit.   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  He meets diagnostic criteria by having a serum free light chain ratio greater than 100 and lytic lesions causing pathological fracture of the humerus.  Given this I would recommend we pursued Darzalex, Revlimid, and dexamethasone.  Given his advanced age I would not recommend transplantation, that this is something we can consider when it comes time to transition to maintenance therapy versus transplant.  The patient voices understanding of the plan moving forward.  # IgA Kappa Multiple Myeloma --Metastatic survey completed 12/15/2021 --Patient underwent fixation of his humerus on 12/17/2021, pathology confirmed plasmacytoma/plasma cell neoplasm.  --Cycle 1 Day 1 of  Darzalex, Revlimid, and dexamethasone started on 12/28/2021.  Plan: --labs today show white blood cell 8.2, hemoglobin 13.5, MCV 92.2 and platelets of 168 --initial restaging labs today with SPEP and SFLC --today is Cycle 2 Day 15 of Dara/Dex/Revlimid, however will need to hold due to need for isolation room given our concern for shingles. --RTC in 2 weeks with interval weekly treatments  # Rash Concerning for Shingles -- Prescribed valacyclovir 3 times daily 1000 mg x 10 days -- We will continue to monitor rash closely. -- Patient will require chemotherapy in isolation room.  Chemotherapy delayed until tomorrow as it is not have a room available today.  #Left Rib Pain -- X-ray of the ribs show no clear issues with the left ribs. --Pain is steadily improving. --known disease in right ribs -- Continue to monitor  #Supportive Care -- chemotherapy education complete -- port placement not required -- zofran 75m q8H PRN and compazine 136mPO  q6H for nausea -- acyclovir 40036mO BID for VCZ prophylaxis -- Zometa/Xgeva treatment to start once dental clearance is obtained.  Discussed this with the patient today. -- Pain is managed right now with tylenol  Offered stronger pain medication if pain were to worsen.    Orders Placed This Encounter  Procedures   Multiple Myeloma Panel (SPEP&IFE w/QIG)    Standing Status:   Future    Standing Expiration Date:   03/23/2023   Kappa/lambda light chains    Standing Status:   Future  Standing Expiration Date:   03/23/2023   CMP ([facility] only)    Standing Status:   Future    Standing Expiration Date:   03/24/2023   Lactate dehydrogenase (LDH)    Standing Status:   Future    Standing Expiration Date:   03/23/2023   CBC with Differential (Cancer Center Only)    Standing Status:   Future    Standing Expiration Date:   03/24/2023   CMP ([facility] only)    Standing Status:   Future    Standing Expiration Date:   04/07/2023   Lactate dehydrogenase (LDH)    Standing Status:   Future    Standing Expiration Date:   04/06/2023   CBC with Differential ([facility] Only)    Standing Status:   Future    Standing Expiration Date:   04/07/2023   Multiple Myeloma Panel (SPEP&IFE w/QIG)    Standing Status:   Future    Standing Expiration Date:   04/20/2023   Kappa/lambda light chains    Standing Status:   Future    Standing Expiration Date:   04/20/2023   CMP ([facility] only)    Standing Status:   Future    Standing Expiration Date:   04/21/2023   Lactate dehydrogenase (LDH)    Standing Status:   Future    Standing Expiration Date:   04/20/2023   CBC with Differential ([facility] Only)    Standing Status:   Future    Standing Expiration Date:   04/21/2023   CMP ([facility] only)    Standing Status:   Future    Standing Expiration Date:   05/05/2023   Lactate dehydrogenase (LDH)    Standing Status:   Future    Standing Expiration Date:   05/04/2023   CBC with  Differential ([facility] Only)    Standing Status:   Future    Standing Expiration Date:   05/05/2023    All questions were answered. The patient knows to call the clinic with any problems, questions or concerns.  A total of more than 30 minutes were spent on this encounter with face-to-face time and non-face-to-face time, including preparing to see the patient, ordering tests and/or medications, counseling the patient and coordination of care as outlined above.   Ledell Peoples, MD Department of Hematology/Oncology [place] at [facility] Phone: [phone] Pager: [phone] Email: [email or web address]  02/09/2022 2:32 PM

## 2022-02-10 ENCOUNTER — Inpatient Hospital Stay: Payer: Medicare Other

## 2022-02-10 VITALS — BP 151/77 | HR 60 | Temp 98.2°F | Resp 18

## 2022-02-10 DIAGNOSIS — I10 Essential (primary) hypertension: Secondary | ICD-10-CM | POA: Diagnosis not present

## 2022-02-10 DIAGNOSIS — I480 Paroxysmal atrial fibrillation: Secondary | ICD-10-CM | POA: Diagnosis not present

## 2022-02-10 DIAGNOSIS — R197 Diarrhea, unspecified: Secondary | ICD-10-CM | POA: Diagnosis not present

## 2022-02-10 DIAGNOSIS — Z5112 Encounter for antineoplastic immunotherapy: Secondary | ICD-10-CM | POA: Diagnosis not present

## 2022-02-10 DIAGNOSIS — C9000 Multiple myeloma not having achieved remission: Secondary | ICD-10-CM

## 2022-02-10 DIAGNOSIS — M47814 Spondylosis without myelopathy or radiculopathy, thoracic region: Secondary | ICD-10-CM | POA: Diagnosis not present

## 2022-02-10 DIAGNOSIS — Z79624 Long term (current) use of inhibitors of nucleotide synthesis: Secondary | ICD-10-CM | POA: Diagnosis not present

## 2022-02-10 DIAGNOSIS — Z8249 Family history of ischemic heart disease and other diseases of the circulatory system: Secondary | ICD-10-CM | POA: Diagnosis not present

## 2022-02-10 DIAGNOSIS — R0781 Pleurodynia: Secondary | ICD-10-CM | POA: Diagnosis not present

## 2022-02-10 DIAGNOSIS — Z86718 Personal history of other venous thrombosis and embolism: Secondary | ICD-10-CM | POA: Diagnosis not present

## 2022-02-10 DIAGNOSIS — Z7901 Long term (current) use of anticoagulants: Secondary | ICD-10-CM | POA: Diagnosis not present

## 2022-02-10 DIAGNOSIS — Z823 Family history of stroke: Secondary | ICD-10-CM | POA: Diagnosis not present

## 2022-02-10 DIAGNOSIS — Z79899 Other long term (current) drug therapy: Secondary | ICD-10-CM | POA: Diagnosis not present

## 2022-02-10 DIAGNOSIS — Z881 Allergy status to other antibiotic agents status: Secondary | ICD-10-CM | POA: Diagnosis not present

## 2022-02-10 DIAGNOSIS — R21 Rash and other nonspecific skin eruption: Secondary | ICD-10-CM | POA: Diagnosis not present

## 2022-02-10 MED ORDER — DIPHENHYDRAMINE HCL 25 MG PO CAPS
50.0000 mg | ORAL_CAPSULE | Freq: Once | ORAL | Status: AC
  Administered 2022-02-10: 50 mg via ORAL
  Filled 2022-02-10: qty 2

## 2022-02-10 MED ORDER — DARATUMUMAB-HYALURONIDASE-FIHJ 1800-30000 MG-UT/15ML ~~LOC~~ SOLN
1800.0000 mg | Freq: Once | SUBCUTANEOUS | Status: AC
  Administered 2022-02-10: 1800 mg via SUBCUTANEOUS
  Filled 2022-02-10: qty 15

## 2022-02-10 MED ORDER — ACETAMINOPHEN 325 MG PO TABS
650.0000 mg | ORAL_TABLET | Freq: Once | ORAL | Status: AC
  Administered 2022-02-10: 650 mg via ORAL
  Filled 2022-02-10: qty 2

## 2022-02-10 NOTE — Patient Instructions (Signed)
[facility] MEDICAL [facility]COLOGY   Discharge Instructions: Thank you for choosing [facility] to provide your oncology and hematology care.   If you have a lab appointment with the Cancer Center, please go directly to the Cancer Center and check in at the registration area.   Wear comfortable clothing and clothing appropriate for easy access to any Portacath or PICC line.   We strive to give you quality time with your provider. You may need to reschedule your appointment if you arrive late (15 or more minutes).  Arriving late affects you and other patients whose appointments are after yours.  Also, if you miss three or more appointments without notifying the office, you may be dismissed from the clinic at the provider's discretion.      For prescription refill requests, have your pharmacy contact our office and allow 72 hours for refills to be completed.    Today you received the following chemotherapy and/or immunotherapy agents: Daratumumab hyaluronidase (Darzalex faspro)      To help prevent nausea and vomiting after your treatment, we encourage you to take your nausea medication as directed.  BELOW ARE SYMPTOMS THAT SHOULD BE REPORTED IMMEDIATELY: *FEVER GREATER THAN 100.4 F (38 C) OR HIGHER *CHILLS OR SWEATING *NAUSEA AND VOMITING THAT IS NOT CONTROLLED WITH YOUR NAUSEA MEDICATION *UNUSUAL SHORTNESS OF BREATH *UNUSUAL BRUISING OR BLEEDING *URINARY PROBLEMS (pain or burning when urinating, or frequent urination) *BOWEL PROBLEMS (unusual diarrhea, constipation, pain near the anus) TENDERNESS IN MOUTH AND THROAT WITH OR WITHOUT PRESENCE OF ULCERS (sore throat, sores in mouth, or a toothache) UNUSUAL RASH, SWELLING OR PAIN  UNUSUAL VAGINAL DISCHARGE OR ITCHING   Items with * indicate a potential emergency and should be followed up as soon as possible or go to the Emergency Department if any problems should occur.  Please show the CHEMOTHERAPY ALERT CARD or  IMMUNOTHERAPY ALERT CARD at check-in to the Emergency Department and triage nurse.  Should you have questions after your visit or need to cancel or reschedule your appointment, please contact [facility]ICAL [facility]COL[facility]  Dept: [phone]  and follow the prompts.  Office hours are 8:00 a.m. to 4:30 p.m. Monday - Friday. Please note that voicemails left after 4:00 p.m. may not be returned until the following business day.  We are closed weekends and major holidays. You have access to a nurse at all times for urgent questions. Please call the main number to the clinic Dept: [phone] and follow the prompts.   For any non-urgent questions, you may also contact your provider using MyChart. We now offer e-Visits for anyone 18 and older to request care online for non-urgent symptoms. For details visit [email or web address].   Also download the MyChart app! Go to the app store, search "MyChart", open the app, select [facility], and log in with your MyChart username and password.  Masks are optional in the cancer centers. If you would like for your care team to wear a mask while they are taking care of you, please let them know. You may have one support person who is at least 71 years old accompany you for your appointments. 

## 2022-02-10 NOTE — Progress Notes (Signed)
Per Dr. Lorenso Courier - okay to treat with darzalex faspro today. Patient did not need additional dose of dexamethasone as patient had dose prior to appointment yesterday.

## 2022-02-11 ENCOUNTER — Other Ambulatory Visit: Payer: Self-pay | Admitting: *Deleted

## 2022-02-11 DIAGNOSIS — C9000 Multiple myeloma not having achieved remission: Secondary | ICD-10-CM

## 2022-02-11 MED ORDER — LENALIDOMIDE 25 MG PO CAPS: 25.0000 mg | ORAL_CAPSULE | Freq: Every day | ORAL | 0 refills | Status: DC

## 2022-02-12 DIAGNOSIS — M6281 Muscle weakness (generalized): Secondary | ICD-10-CM | POA: Diagnosis not present

## 2022-02-12 DIAGNOSIS — M25611 Stiffness of right shoulder, not elsewhere classified: Secondary | ICD-10-CM | POA: Diagnosis not present

## 2022-02-12 DIAGNOSIS — M84421D Pathological fracture, right humerus, subsequent encounter for fracture with routine healing: Secondary | ICD-10-CM | POA: Diagnosis not present

## 2022-02-15 DIAGNOSIS — M84421D Pathological fracture, right humerus, subsequent encounter for fracture with routine healing: Secondary | ICD-10-CM | POA: Diagnosis not present

## 2022-02-16 ENCOUNTER — Other Ambulatory Visit: Payer: Self-pay

## 2022-02-16 ENCOUNTER — Inpatient Hospital Stay: Payer: Medicare Other

## 2022-02-16 VITALS — BP 136/57 | HR 56 | Temp 97.7°F | Resp 18 | Ht 70.0 in | Wt 251.0 lb

## 2022-02-16 DIAGNOSIS — C9000 Multiple myeloma not having achieved remission: Secondary | ICD-10-CM | POA: Diagnosis not present

## 2022-02-16 DIAGNOSIS — M25611 Stiffness of right shoulder, not elsewhere classified: Secondary | ICD-10-CM | POA: Diagnosis not present

## 2022-02-16 DIAGNOSIS — M84421D Pathological fracture, right humerus, subsequent encounter for fracture with routine healing: Secondary | ICD-10-CM | POA: Diagnosis not present

## 2022-02-16 DIAGNOSIS — R0781 Pleurodynia: Secondary | ICD-10-CM | POA: Diagnosis not present

## 2022-02-16 DIAGNOSIS — I480 Paroxysmal atrial fibrillation: Secondary | ICD-10-CM | POA: Diagnosis not present

## 2022-02-16 DIAGNOSIS — R197 Diarrhea, unspecified: Secondary | ICD-10-CM | POA: Diagnosis not present

## 2022-02-16 DIAGNOSIS — Z823 Family history of stroke: Secondary | ICD-10-CM | POA: Diagnosis not present

## 2022-02-16 DIAGNOSIS — R21 Rash and other nonspecific skin eruption: Secondary | ICD-10-CM | POA: Diagnosis not present

## 2022-02-16 DIAGNOSIS — Z7901 Long term (current) use of anticoagulants: Secondary | ICD-10-CM | POA: Diagnosis not present

## 2022-02-16 DIAGNOSIS — I10 Essential (primary) hypertension: Secondary | ICD-10-CM | POA: Diagnosis not present

## 2022-02-16 DIAGNOSIS — M47814 Spondylosis without myelopathy or radiculopathy, thoracic region: Secondary | ICD-10-CM | POA: Diagnosis not present

## 2022-02-16 DIAGNOSIS — Z86718 Personal history of other venous thrombosis and embolism: Secondary | ICD-10-CM | POA: Diagnosis not present

## 2022-02-16 DIAGNOSIS — Z881 Allergy status to other antibiotic agents status: Secondary | ICD-10-CM | POA: Diagnosis not present

## 2022-02-16 DIAGNOSIS — Z79899 Other long term (current) drug therapy: Secondary | ICD-10-CM | POA: Diagnosis not present

## 2022-02-16 DIAGNOSIS — Z8249 Family history of ischemic heart disease and other diseases of the circulatory system: Secondary | ICD-10-CM | POA: Diagnosis not present

## 2022-02-16 DIAGNOSIS — M6281 Muscle weakness (generalized): Secondary | ICD-10-CM | POA: Diagnosis not present

## 2022-02-16 DIAGNOSIS — Z5112 Encounter for antineoplastic immunotherapy: Secondary | ICD-10-CM | POA: Diagnosis not present

## 2022-02-16 DIAGNOSIS — Z79624 Long term (current) use of inhibitors of nucleotide synthesis: Secondary | ICD-10-CM | POA: Diagnosis not present

## 2022-02-16 LAB — CMP (CANCER CENTER ONLY)
ALT: 27 U/L (ref 0–44)
AST: 17 U/L (ref 15–41)
Albumin: 4 g/dL (ref 3.5–5.0)
Alkaline Phosphatase: 128 U/L — ABNORMAL HIGH (ref 38–126)
Anion gap: 5 (ref 5–15)
BUN: 12 mg/dL (ref 8–23)
CO2: 33 mmol/L — ABNORMAL HIGH (ref 22–32)
Calcium: 9 mg/dL (ref 8.9–10.3)
Chloride: 102 mmol/L (ref 98–111)
Creatinine: 0.81 mg/dL (ref 0.61–1.24)
GFR, Estimated: >60 mL/min (ref >60)
Glucose, Bld: 186 mg/dL — ABNORMAL HIGH (ref 70–99)
Potassium: 3.7 mmol/L (ref 3.5–5.1)
Sodium: 140 mmol/L (ref 135–145)
Total Bilirubin: 0.4 mg/dL (ref 0.3–1.2)
Total Protein: 6.4 g/dL — ABNORMAL LOW (ref 6.5–8.1)

## 2022-02-16 LAB — CBC WITH DIFFERENTIAL (CANCER CENTER ONLY)
Abs Immature Granulocytes: 0.02 10*3/uL (ref 0.00–0.07)
Basophils Absolute: 0 10*3/uL (ref 0.0–0.1)
Basophils Relative: 0 %
Eosinophils Absolute: 0.1 10*3/uL (ref 0.0–0.5)
Eosinophils Relative: 1 %
HCT: 40.6 % (ref 39.0–52.0)
Hemoglobin: 14.1 g/dL (ref 13.0–17.0)
Immature Granulocytes: 0 %
Lymphocytes Relative: 2 %
Lymphs Abs: 0.1 10*3/uL — ABNORMAL LOW (ref 0.7–4.0)
MCH: 32 pg (ref 26.0–34.0)
MCHC: 34.7 g/dL (ref 30.0–36.0)
MCV: 92.1 fL (ref 80.0–100.0)
Monocytes Absolute: 0.1 10*3/uL (ref 0.1–1.0)
Monocytes Relative: 2 %
Neutro Abs: 4.7 10*3/uL (ref 1.7–7.7)
Neutrophils Relative %: 95 %
Platelet Count: 161 10*3/uL (ref 150–400)
RBC: 4.41 MIL/uL (ref 4.22–5.81)
RDW: 14.3 % (ref 11.5–15.5)
WBC Count: 4.9 10*3/uL (ref 4.0–10.5)
nRBC: 0 % (ref 0.0–0.2)

## 2022-02-16 LAB — LACTATE DEHYDROGENASE: LDH: 147 U/L (ref 98–192)

## 2022-02-16 MED ORDER — DIPHENHYDRAMINE HCL 25 MG PO CAPS
50.0000 mg | ORAL_CAPSULE | Freq: Once | ORAL | Status: AC
  Administered 2022-02-16: 50 mg via ORAL
  Filled 2022-02-16: qty 2

## 2022-02-16 MED ORDER — DARATUMUMAB-HYALURONIDASE-FIHJ 1800-30000 MG-UT/15ML ~~LOC~~ SOLN
1800.0000 mg | Freq: Once | SUBCUTANEOUS | Status: AC
  Administered 2022-02-16: 1800 mg via SUBCUTANEOUS
  Filled 2022-02-16: qty 15

## 2022-02-16 MED ORDER — ACETAMINOPHEN 325 MG PO TABS
650.0000 mg | ORAL_TABLET | Freq: Once | ORAL | Status: AC
  Administered 2022-02-16: 650 mg via ORAL
  Filled 2022-02-16: qty 2

## 2022-02-16 NOTE — Patient Instructions (Signed)
[facility] MEDICAL ONCOLOGY  Discharge Instructions: Thank you for choosing [facility] to provide your oncology and hematology care.   If you have a lab appointment with the Cancer Center, please go directly to the Cancer Center and check in at the registration area.   Wear comfortable clothing and clothing appropriate for easy access to any Portacath or PICC line.   We strive to give you quality time with your provider. You may need to reschedule your appointment if you arrive late (15 or more minutes).  Arriving late affects you and other patients whose appointments are after yours.  Also, if you miss three or more appointments without notifying the office, you may be dismissed from the clinic at the provider's discretion.      For prescription refill requests, have your pharmacy contact our office and allow 72 hours for refills to be completed.    Today you received the following chemotherapy and/or immunotherapy agents darzalex faspro      To help prevent nausea and vomiting after your treatment, we encourage you to take your nausea medication as directed.  BELOW ARE SYMPTOMS THAT SHOULD BE REPORTED IMMEDIATELY: *FEVER GREATER THAN 100.4 F (38 C) OR HIGHER *CHILLS OR SWEATING *NAUSEA AND VOMITING THAT IS NOT CONTROLLED WITH YOUR NAUSEA MEDICATION *UNUSUAL SHORTNESS OF BREATH *UNUSUAL BRUISING OR BLEEDING *URINARY PROBLEMS (pain or burning when urinating, or frequent urination) *BOWEL PROBLEMS (unusual diarrhea, constipation, pain near the anus) TENDERNESS IN MOUTH AND THROAT WITH OR WITHOUT PRESENCE OF ULCERS (sore throat, sores in mouth, or a toothache) UNUSUAL RASH, SWELLING OR PAIN  UNUSUAL VAGINAL DISCHARGE OR ITCHING   Items with * indicate a potential emergency and should be followed up as soon as possible or go to the Emergency Department if any problems should occur.  Please show the CHEMOTHERAPY ALERT CARD or IMMUNOTHERAPY ALERT CARD at  check-in to the Emergency Department and triage nurse.  Should you have questions after your visit or need to cancel or reschedule your appointment, please contact [facility]  Dept: [phone]  and follow the prompts.  Office hours are 8:00 a.m. to 4:30 p.m. Monday - Friday. Please note that voicemails left after 4:00 p.m. may not be returned until the following business day.  We are closed weekends and major holidays. You have access to a nurse at all times for urgent questions. Please call the main number to the clinic Dept: [phone] and follow the prompts.   For any non-urgent questions, you may also contact your provider using MyChart. We now offer e-Visits for anyone 18 and older to request care online for non-urgent symptoms. For details visit [email or web address].   Also download the MyChart app! Go to the app store, search "MyChart", open the app, select [facility], and log in with your MyChart username and password.  Masks are optional in the cancer centers. If you would like for your care team to wear a mask while they are taking care of you, please let them know. You may have one support person who is at least 71 years old accompany you for your appointments. 

## 2022-02-18 DIAGNOSIS — M6281 Muscle weakness (generalized): Secondary | ICD-10-CM | POA: Diagnosis not present

## 2022-02-18 DIAGNOSIS — M84421D Pathological fracture, right humerus, subsequent encounter for fracture with routine healing: Secondary | ICD-10-CM | POA: Diagnosis not present

## 2022-02-18 DIAGNOSIS — M25611 Stiffness of right shoulder, not elsewhere classified: Secondary | ICD-10-CM | POA: Diagnosis not present

## 2022-02-19 DIAGNOSIS — I10 Essential (primary) hypertension: Secondary | ICD-10-CM | POA: Diagnosis not present

## 2022-02-19 DIAGNOSIS — M1611 Unilateral primary osteoarthritis, right hip: Secondary | ICD-10-CM | POA: Diagnosis not present

## 2022-02-19 DIAGNOSIS — I480 Paroxysmal atrial fibrillation: Secondary | ICD-10-CM | POA: Diagnosis not present

## 2022-02-19 DIAGNOSIS — E7800 Pure hypercholesterolemia, unspecified: Secondary | ICD-10-CM | POA: Diagnosis not present

## 2022-02-22 ENCOUNTER — Inpatient Hospital Stay: Payer: Medicare Other

## 2022-02-22 ENCOUNTER — Inpatient Hospital Stay (HOSPITAL_BASED_OUTPATIENT_CLINIC_OR_DEPARTMENT_OTHER): Payer: Medicare Other | Admitting: Hematology and Oncology

## 2022-02-22 DIAGNOSIS — R197 Diarrhea, unspecified: Secondary | ICD-10-CM | POA: Diagnosis not present

## 2022-02-22 DIAGNOSIS — R21 Rash and other nonspecific skin eruption: Secondary | ICD-10-CM | POA: Diagnosis not present

## 2022-02-22 DIAGNOSIS — Z79899 Other long term (current) drug therapy: Secondary | ICD-10-CM | POA: Diagnosis not present

## 2022-02-22 DIAGNOSIS — Z823 Family history of stroke: Secondary | ICD-10-CM | POA: Diagnosis not present

## 2022-02-22 DIAGNOSIS — R0781 Pleurodynia: Secondary | ICD-10-CM | POA: Diagnosis not present

## 2022-02-22 DIAGNOSIS — Z881 Allergy status to other antibiotic agents status: Secondary | ICD-10-CM | POA: Diagnosis not present

## 2022-02-22 DIAGNOSIS — C9000 Multiple myeloma not having achieved remission: Secondary | ICD-10-CM

## 2022-02-22 DIAGNOSIS — I10 Essential (primary) hypertension: Secondary | ICD-10-CM | POA: Diagnosis not present

## 2022-02-22 DIAGNOSIS — Z8249 Family history of ischemic heart disease and other diseases of the circulatory system: Secondary | ICD-10-CM | POA: Diagnosis not present

## 2022-02-22 DIAGNOSIS — Z5112 Encounter for antineoplastic immunotherapy: Secondary | ICD-10-CM | POA: Diagnosis not present

## 2022-02-22 DIAGNOSIS — M47814 Spondylosis without myelopathy or radiculopathy, thoracic region: Secondary | ICD-10-CM | POA: Diagnosis not present

## 2022-02-22 DIAGNOSIS — I480 Paroxysmal atrial fibrillation: Secondary | ICD-10-CM | POA: Diagnosis not present

## 2022-02-22 DIAGNOSIS — Z79624 Long term (current) use of inhibitors of nucleotide synthesis: Secondary | ICD-10-CM | POA: Diagnosis not present

## 2022-02-22 DIAGNOSIS — Z7901 Long term (current) use of anticoagulants: Secondary | ICD-10-CM | POA: Diagnosis not present

## 2022-02-22 DIAGNOSIS — Z86718 Personal history of other venous thrombosis and embolism: Secondary | ICD-10-CM | POA: Diagnosis not present

## 2022-02-22 LAB — CMP (CANCER CENTER ONLY)
ALT: 27 U/L (ref 0–44)
AST: 16 U/L (ref 15–41)
Albumin: 3.7 g/dL (ref 3.5–5.0)
Alkaline Phosphatase: 119 U/L (ref 38–126)
Anion gap: 6 (ref 5–15)
BUN: 17 mg/dL (ref 8–23)
CO2: 30 mmol/L (ref 22–32)
Calcium: 8.9 mg/dL (ref 8.9–10.3)
Chloride: 103 mmol/L (ref 98–111)
Creatinine: 0.91 mg/dL (ref 0.61–1.24)
GFR, Estimated: >60 mL/min (ref >60)
Glucose, Bld: 106 mg/dL — ABNORMAL HIGH (ref 70–99)
Potassium: 3.2 mmol/L — ABNORMAL LOW (ref 3.5–5.1)
Sodium: 139 mmol/L (ref 135–145)
Total Bilirubin: 0.5 mg/dL (ref 0.3–1.2)
Total Protein: 5.9 g/dL — ABNORMAL LOW (ref 6.5–8.1)

## 2022-02-22 LAB — CBC WITH DIFFERENTIAL (CANCER CENTER ONLY)
Abs Immature Granulocytes: 0.02 10*3/uL (ref 0.00–0.07)
Basophils Absolute: 0.1 10*3/uL (ref 0.0–0.1)
Basophils Relative: 1 %
Eosinophils Absolute: 0.6 10*3/uL — ABNORMAL HIGH (ref 0.0–0.5)
Eosinophils Relative: 9 %
HCT: 39.4 % (ref 39.0–52.0)
Hemoglobin: 13.6 g/dL (ref 13.0–17.0)
Immature Granulocytes: 0 %
Lymphocytes Relative: 6 %
Lymphs Abs: 0.4 10*3/uL — ABNORMAL LOW (ref 0.7–4.0)
MCH: 32 pg (ref 26.0–34.0)
MCHC: 34.5 g/dL (ref 30.0–36.0)
MCV: 92.7 fL (ref 80.0–100.0)
Monocytes Absolute: 0.7 10*3/uL (ref 0.1–1.0)
Monocytes Relative: 10 %
Neutro Abs: 5.3 10*3/uL (ref 1.7–7.7)
Neutrophils Relative %: 74 %
Platelet Count: 196 10*3/uL (ref 150–400)
RBC: 4.25 MIL/uL (ref 4.22–5.81)
RDW: 14.8 % (ref 11.5–15.5)
WBC Count: 7.1 10*3/uL (ref 4.0–10.5)
nRBC: 0 % (ref 0.0–0.2)

## 2022-02-22 LAB — LACTATE DEHYDROGENASE: LDH: 134 U/L (ref 98–192)

## 2022-02-22 MED ORDER — ACETAMINOPHEN 325 MG PO TABS
650.0000 mg | ORAL_TABLET | Freq: Once | ORAL | Status: AC
  Administered 2022-02-22: 650 mg via ORAL
  Filled 2022-02-22: qty 2

## 2022-02-22 MED ORDER — DIPHENHYDRAMINE HCL 25 MG PO CAPS
50.0000 mg | ORAL_CAPSULE | Freq: Once | ORAL | Status: AC
  Administered 2022-02-22: 50 mg via ORAL
  Filled 2022-02-22: qty 2

## 2022-02-22 MED ORDER — DARATUMUMAB-HYALURONIDASE-FIHJ 1800-30000 MG-UT/15ML ~~LOC~~ SOLN
1800.0000 mg | Freq: Once | SUBCUTANEOUS | Status: AC
  Administered 2022-02-22: 1800 mg via SUBCUTANEOUS
  Filled 2022-02-22: qty 15

## 2022-02-22 NOTE — Progress Notes (Signed)
Travis [name] [phone]   [phone]  PROGRESS NOTE  Patient Care Team: Lajean Manes, MD as PCP - General (Internal Medicine) Fay Records, MD as PCP - Cardiology (Cardiology) Sueanne Margarita, MD as PCP - Sleep Medicine (Cardiology) Thompson Grayer, MD as PCP - Electrophysiology (Cardiology) Michael Boston, MD as Consulting Physician (General Surgery) Clarene Essex, MD as Consulting Physician (Gastroenterology) Sueanne Margarita, MD as Consulting Physician (Sleep Medicine) Alda Berthold, DO as Consulting Physician (Neurology)  Hematological/Oncological History # IgA Kappa Multiple Myeloma 05/05/2020: Bone marrow biopsy showed increased number of plasma cells representing 14% of all cells in the aspirate associated with numerous variably sized clusters in the clot and biopsy sections 04/10/2021: M protein 0.3, IgA kappa specificity. Kappa 580.5, Labda 7.0, ratio 82.93.   12/07/2021: Labs show of 1971.3, lambda 5.5, ratio 358.42 12/10/2021: Transition care to Dr. Lorenso Courier. 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  01/26/2022: Cycle 2 Day 1 of Dara/Rev/Dex 02/22/2022: Cycle 3 Day 1 of Dara/Rev/Dex  Interval History:  Travis [name] 71 y.o. male with medical history significant for IgA Kappa Multiple Myeloma who presents for a follow up visit. The patient's last visit was on 02/09/2022. In the interim since the last visit he has continued of Dara/Rev/Dex.  On exam today Travis [name] is accompanied by his wife.  He reports he has been well in the interim since the last visit.  He notes that the shingles rash is markedly improved.  Does not itch and does not cause any pain.  He completed his 10 days of valacyclovir.  He reports that he is currently on his off week of Revlimid and will be restarting again on Friday.  Thinks his arm is doing quite  well and he recently had a 19-monthfollow-up where healing of the bones was noted.  He is also tolerating physical therapy well and improving steadily.  He is tolerating the Darzalex shots and Revlimid without any nausea, vomiting, or diarrhea.  He does have some occasional loose stools about once per week.  He is not having any problems with the injection site.  Overall he is willing and able to proceed with treatment at this time.  He is not having any issues with nausea, vomiting, or diarrhea.  His appetite has been good and his weight is stable.  He denies any fevers, chills, sweats, shortness of breath, chest pain.  A full 10 point ROS is listed below.  MEDICAL HISTORY:  Past Medical History:  Diagnosis Date   Arthritis    "comes and goes; mostly in hands, occasionally elbow" (154/10/5033   Complication of anesthesia    "just wanted to sleep alot  for couple days S/P VARICOCELE OR" (04/05/2017)   DVT (deep venous thrombosis) (HCC)    LLE   Dysrhythmia    PVCs    GERD (gastroesophageal reflux disease)    History of hiatal hernia    Hypertension    Impaired glucose tolerance    Multiple myeloma (HCC)    Obesity (BMI 30-39.9) 07/26/2016   OSA on CPAP 07/26/2016   Mild with AHI 12.5/hr now on CPAP at 14cm H2O   PAF (paroxysmal atrial fibrillation) ([facility]    s/p PVI Ablation in 2018 // Flecainide Rx // Echo 8/21: EF 60-65, no RWMA, mild LVH, normal RV SF, moderate RVE, mild BAE, trivial MR, mild dilation of aortic root (40 mm)  Pneumonia    "may have had walking pneumonia a few years ago" (04/05/2017)   Pre-diabetes    Thrombophlebitis     SURGICAL HISTORY: Past Surgical History:  Procedure Laterality Date   ATRIAL FIBRILLATION ABLATION  04/05/2017   ATRIAL FIBRILLATION ABLATION N/A 04/05/2017   Procedure: ATRIAL FIBRILLATION ABLATION;  Surgeon: Thompson Grayer, MD;  Location: [facility];  Service: Cardiovascular;  Laterality: N/A;   BONE BIOPSY Right 12/17/2021   Procedure: BONE  BIOPSY;  Surgeon: Hiram Gash, MD;  Location: [facility];  Service: Orthopedics;  Laterality: Right;   COMPLETE RIGHT HIP REPLACMENT  01/19/2019   EVALUATION UNDER ANESTHESIA WITH HEMORRHOIDECTOMY N/A 02/24/2017   Procedure: EXAM UNDER ANESTHESIA WITH  HEMORRHOIDECTOMY;  Surgeon: Michael Boston, MD;  Location: [facility];  Service: General;  Laterality: N/A;   EXCISIONAL TOTAL HIP ARTHROPLASTY WITH ANTIBIOTIC SPACERS Right 09/25/2020   Procedure: EXCISIONAL TOTAL HIP ARTHROPLASTY WITH ANTIBIOTIC SPACERS;  Surgeon: Paralee Cancel, MD;  Location: [facility];  Service: Orthopedics;  Laterality: Right;   FLEXIBLE SIGMOIDOSCOPY N/A 04/15/2015   Procedure:  UNSEDATED FLEXIBLE SIGMOIDOSCOPY;  Surgeon: [name], MD;  Location: [facility];  Service: Endoscopy;  Laterality: N/A;   HUMERUS IM NAIL Right 12/17/2021   Procedure: INTRAMEDULLARY (IM) NAIL HUMERAL;  Surgeon: Hiram Gash, MD;  Location: [facility];  Service: Orthopedics;  Laterality: Right;   KNEE ARTHROSCOPY Left ~ 2016   REIMPLANTATION OF TOTAL HIP Right 12/25/2020   Procedure: REIMPLANTATION/REVISION OF RIGHT TOTAL HIP VERSUS REPEAT IRRIGATION AND DEBRIDEMENT;  Surgeon: Paralee Cancel, MD;  Location: [facility];  Service: Orthopedics;  Laterality: Right;   TESTICLE SURGERY  1988   VARICOCELE   TONSILLECTOMY     VARICOSE VEIN SURGERY Bilateral     SOCIAL HISTORY: Social History   Socioeconomic History   Marital status: Married    Spouse name: Not on file   Number of children: 1   Years of education: law school   Highest education level: Not on file  Occupational History   Occupation: lawyer  Tobacco Use   Smoking status: Never   Smokeless tobacco: Never  Vaping Use   Vaping Use: Never used  Substance and Sexual Activity   Alcohol use: Yes    Alcohol/week: 2.0 standard drinks of alcohol    Types: 2 Glasses of wine per week    Comment: 2 glasses of wine a week, occ bourbon, beer   Drug use: No    Sexual activity: Not Currently  Other Topics Concern   Not on file  Social History Narrative   Lives in [place] with spouse in a 2 story home.  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    Social Determinants of Health   Financial Resource Strain: Not on file  Food Insecurity: Not on file  Transportation Needs: Not on file  Physical Activity: Not on file  Stress: Not on file  Social Connections: Not on file  Intimate Partner Violence: Not on file    FAMILY HISTORY: Family History  Problem Relation Age of Onset   Dementia Mother    Stroke Father    Atrial fibrillation Father    Hypertension Father    Hypertension Paternal Grandfather    Dementia Maternal Grandmother     ALLERGIES:  is allergic to linezolid and vancomycin.  MEDICATIONS:  Current Outpatient Medications  Medication Sig Dispense Refill   Acetaminophen (  TYLENOL EXTRA STRENGTH PO) Take 2 tablets by mouth as needed (For pain).     acyclovir (ZOVIRAX) 400 MG tablet Take 1 tablet (400 mg total) by mouth 2 (two) times daily. 180 tablet 3   amLODipine (NORVASC) 10 MG tablet Take 10 mg by mouth daily.      apixaban (ELIQUIS) 5 MG TABS tablet TAKE ONE TABLET BY MOUTH TWICE A DAY 60 tablet 6   Ascorbic Acid (VITAMIN C) 100 MG tablet Take 100 mg by mouth daily.     cholecalciferol (VITAMIN D3) 25 MCG (1000 UNIT) tablet Take 1,000 Units by mouth daily.     clobetasol cream (TEMOVATE) 1.61 % Apply 1 Application topically as needed.     Cyanocobalamin (VITAMIN B-12 PO) Take 1 tablet by mouth daily. Not sure the dosage     dexamethasone (DECADRON) 4 MG tablet Take 10 tablets (40 mg total) by mouth once a week. 40 tablet 5   Emollient (UDDERLY SMOOTH) CREA Apply topically daily.     flecainide (TAMBOCOR) 50 MG tablet TAKE ONE AND ONE-HALF TABLETS BY MOUTH TWICE A DAY (Patient taking differently: Take 100 tablets by mouth 2 (two) times daily.) 270 tablet 3    fluticasone (FLONASE) 50 MCG/ACT nasal spray Place 1 spray into both nostrils daily as needed for allergies or rhinitis.     folic acid (FOLVITE) 1 MG tablet Take 1 mg by mouth daily.     furosemide (LASIX) 40 MG tablet Take 1 tablet (40 mg) by mouth every other day alternating with 1 and 1/2 tablets (60 mg) on the opposite days. (Patient taking differently: Take 1 tablet (40 mg) by mouth every other day alternating with 1 and 1/2 tablets (60 mg) on the opposite days. Only taking as needed) 113 tablet 3   gabapentin (NEURONTIN) 300 MG capsule TAKE TWO CAPSULES BY MOUTH TWICE A DAY 360 capsule 0   hydrALAZINE (APRESOLINE) 10 MG tablet Take 1 tablet (10 mg total) by mouth 2 (two) times daily. AS NEEDED FOR HIGH BP 180 tablet 3   hydrochlorothiazide (HYDRODIURIL) 25 MG tablet TAKE ONE TABLET BY MOUTH ONE TIME DAILY 90 tablet 3   lenalidomide (REVLIMID) 25 MG capsule Take 1 capsule (25 mg total) by mouth daily. Take 21 days on, 7 days off, repeat every 28 days. [employer] auth # [id number] Date obtained: 02/11/22 21 capsule 0   lisinopril (ZESTRIL) 40 MG tablet TAKE ONE TABLET BY MOUTH ONE TIME DAILY 90 tablet 3   metoprolol succinate (TOPROL-XL) 50 MG 24 hr tablet Take 0.5 tablets (25 mg total) by mouth daily. (Patient taking differently: Take 50 mg by mouth daily.) 90 tablet 3   oxymetazoline (AFRIN) 0.05 % nasal spray Place 1 spray into both nostrils 2 (two) times daily as needed for congestion.     potassium chloride SA (KLOR-CON M) 20 MEQ tablet TAKE ONE TABLET BY MOUTH ONE TIME DAILY, TAKE 40 MG (2 TABLETS) ON DAYS THAT YOU ALSO TAKE LASIX (FUROSEMIDE) 135 tablet 3   psyllium (METAMUCIL) 58.6 % packet Take 1 packet by mouth as needed.     sodium chloride (OCEAN) 0.65 % SOLN nasal spray Place 1 spray into both nostrils as needed for congestion.     triamcinolone cream (KENALOG) 0.1 % Apply 1 application topically daily as needed (itching).     prochlorperazine (COMPAZINE) 10 MG tablet Take 1 tablet (10 mg  total) by mouth every 6 (six) hours as needed for nausea or vomiting. (Patient not taking: Reported on 02/22/2022) 30  tablet 0   No current facility-administered medications for this visit.    REVIEW OF SYSTEMS:   Constitutional: ( - ) fevers, ( - )  chills , ( - ) night sweats Eyes: ( - ) blurriness of vision, ( - ) double vision, ( - ) watery eyes Ears, nose, mouth, throat, and face: ( - ) mucositis, ( - ) sore throat Respiratory: ( - ) cough, ( - ) dyspnea, ( - ) wheezes Cardiovascular: ( - ) palpitation, ( - ) chest discomfort, ( - ) lower extremity swelling Gastrointestinal:  ( - ) nausea, ( - ) heartburn, ( - ) change in bowel habits Skin: ( - ) abnormal skin rashes Lymphatics: ( - ) new lymphadenopathy, ( - ) easy bruising Neurological: ( - ) numbness, ( - ) tingling, ( - ) new weaknesses Behavioral/Psych: ( - ) mood change, ( - ) new changes  All other systems were reviewed with the patient and are negative.  PHYSICAL EXAMINATION: ECOG PERFORMANCE STATUS: 1 - Symptomatic but completely ambulatory  Vitals:   02/22/22 0846  BP: (!) 142/72  Pulse: (!) 52  Resp: 15  Temp: 97.9 F (36.6 C)  SpO2: 98%   Filed Weights   02/22/22 0846  Weight: 253 lb 9.6 oz (115 kg)    GENERAL: Well-appearing elderly Caucasian male, alert, no distress and comfortable SKIN: Rash in a dermatomal distribution that does not cross the midline.  Located at the right upper back.  It is blistering and not draining and mostly crusted. EYES: conjunctiva are pink and non-injected, sclera clear LUNGS: clear to auscultation and percussion with normal breathing effort HEART: regular rate & rhythm and no murmurs and no lower extremity edema Musculoskeletal: no cyanosis of digits and no clubbing  PSYCH: alert & oriented x 3, fluent speech NEURO: no focal motor/sensory deficits  LABORATORY DATA:  I have reviewed the data as listed    Latest Ref Rng & Units 02/22/2022    8:25 AM 02/16/2022    2:05 PM  02/09/2022    9:54 AM  CBC  WBC 4.0 - 10.5 K/uL 7.1  4.9  8.2   Hemoglobin 13.0 - 17.0 g/dL 13.6  14.1  13.5   Hematocrit 39.0 - 52.0 % 39.4  40.6  39.1   Platelets 150 - 400 K/uL 196  161  168        Latest Ref Rng & Units 02/22/2022    8:25 AM 02/16/2022    2:05 PM 02/09/2022    9:54 AM  CMP  Glucose 70 - 99 mg/dL 106  186  112   BUN 8 - 23 mg/dL '17  12  15   ' Creatinine 0.61 - 1.24 mg/dL 0.91  0.81  0.79   Sodium 135 - 145 mmol/L 139  140  141   Potassium 3.5 - 5.1 mmol/L 3.2  3.7  3.6   Chloride 98 - 111 mmol/L 103  102  103   CO2 22 - 32 mmol/L 30  33  33   Calcium 8.9 - 10.3 mg/dL 8.9  9.0  8.9   Total Protein 6.5 - 8.1 g/dL 5.9  6.4  6.1   Total Bilirubin 0.3 - 1.2 mg/dL 0.5  0.4  0.5   Alkaline Phos 38 - 126 U/L 119  128  112   AST 15 - 41 U/L '16  17  13   ' ALT 0 - 44 U/L '27  27  24     ' Lab Results  Component Value Date   MPROTEIN 0.1 (H) 02/02/2022   MPROTEIN 0.1 (H) 01/26/2022   MPROTEIN 0.1 (H) 01/05/2022   Lab Results  Component Value Date   KPAFRELGTCHN 359.5 (H) 02/02/2022   KPAFRELGTCHN 341.6 (H) 01/26/2022   KPAFRELGTCHN 1,438.6 (H) 01/05/2022   LAMBDASER 2.7 (L) 02/02/2022   LAMBDASER 2.8 (L) 01/26/2022   LAMBDASER 2.1 (L) 01/05/2022   KAPLAMBRATIO 133.15 (H) 02/02/2022   KAPLAMBRATIO 122.00 (H) 01/26/2022   KAPLAMBRATIO 685.05 (H) 01/05/2022     RADIOGRAPHIC STUDIES: DG Ribs Bilateral  Result Date: 01/27/2022 CLINICAL DATA:  Active multiple myeloma with left rib pain. EXAM: BILATERAL RIBS - 3+ VIEW COMPARISON:  10/27/2021. FINDINGS: No acute fracture is identified. There is absence of the T6 rib on the right, increased in size from the prior exam. An irregular lucency with cortical destruction is noted in the proximal right humerus with hardware in place. Degenerative changes are present in the thoracic spine. IMPRESSION: 1. Lucency and discontinuity of the T6 rib on the right, concerning for multiple myeloma. 2. No acute cardiopulmonary process.  3. Destructive lucent lesion in the proximal right humerus with hardware fixation. Electronically Signed   By: Brett Fairy M.D.   On: 01/27/2022 04:58    ASSESSMENT & PLAN ZAKARY KIMURA 71 y.o. male with medical history significant for IgA Kappa Multiple Myeloma who presents for a follow up visit.   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  He meets diagnostic criteria by having a serum free light chain ratio greater than 100 and lytic lesions causing pathological fracture of the humerus.  Given this I would recommend we pursued Darzalex, Revlimid, and dexamethasone.  Given his advanced age I would not recommend transplantation, that this is something we can consider when it comes time to transition to maintenance therapy versus transplant.  The patient voices understanding of the plan moving forward.  # IgA Kappa Multiple Myeloma --Metastatic survey completed 12/15/2021 --Patient underwent fixation of his humerus on 12/17/2021, pathology confirmed plasmacytoma/plasma cell neoplasm.  --Cycle 1 Day 1 of  Darzalex, Revlimid, and dexamethasone started on 12/28/2021.  Plan: --labs today show white blood cell 7.1, hemoglobin 13.6, MCV 92.7 and platelets of 196 --restaging labs today with SPEP and SFLC --today is Cycle 3 Day 1 of Dara/Dex/Revlimid, however will need to hold due to need for isolation room given our concern for shingles. --RTC in 2 weeks with interval weekly treatments  # Rash Concerning for Shingles- improving -- Prescribed valacyclovir 3 times daily 1000 mg x 10 days-completed the course -- We will continue to monitor rash closely.  #Left Rib Pain -- X-ray of the ribs show no clear issues with the left ribs. --Pain is steadily improving. --known disease in right ribs -- Continue to monitor  #Supportive Care -- chemotherapy education complete -- port placement not required --  zofran 58m q8H PRN and compazine 147mPO q6H for nausea -- acyclovir 40062mO BID for VCZ prophylaxis -- Zometa/Xgeva treatment to start once dental clearance is obtained.  Discussed this with the patient today. -- Pain is managed right now with tylenol  Offered stronger pain medication if pain were to worsen.    No orders of the defined types were placed in this encounter.   All questions were answered. The patient knows to call the clinic with any problems, questions or concerns.  A total of more than 30 minutes were spent  on this encounter with face-to-face time and non-face-to-face time, including preparing to see the patient, ordering tests and/or medications, counseling the patient and coordination of care as outlined above.   Ledell Peoples, MD Department of Hematology/Oncology [facility] Phone: [phone] Pager: [phone] Email: [email or web address]  02/22/2022 10:11 AM

## 2022-02-22 NOTE — Patient Instructions (Signed)
[facility]COL[facility]  Discharge Instructions: Thank you for choosing [facility] to provide your oncology and hematology care.   If you have a lab appointment with the Cancer Center, please go directly to the Cancer Center and check in at the registration area.   Wear comfortable clothing and clothing appropriate for easy access to any Portacath or PICC line.   We strive to give you quality time with your provider. You may need to reschedule your appointment if you arrive late (15 or more minutes).  Arriving late affects you and other patients whose appointments are after yours.  Also, if you miss three or more appointments without notifying the office, you may be dismissed from the clinic at the provider's discretion.      For prescription refill requests, have your pharmacy contact our office and allow 72 hours for refills to be completed.    Today you received the following chemotherapy and/or immunotherapy agents: Darzalex Faspro      To help prevent nausea and vomiting after your treatment, we encourage you to take your nausea medication as directed.  BELOW ARE SYMPTOMS THAT SHOULD BE REPORTED IMMEDIATELY: *FEVER GREATER THAN 100.4 F (38 C) OR HIGHER *CHILLS OR SWEATING *NAUSEA AND VOMITING THAT IS NOT CONTROLLED WITH YOUR NAUSEA MEDICATION *UNUSUAL SHORTNESS OF BREATH *UNUSUAL BRUISING OR BLEEDING *URINARY PROBLEMS (pain or burning when urinating, or frequent urination) *BOWEL PROBLEMS (unusual diarrhea, constipation, pain near the anus) TENDERNESS IN MOUTH AND THROAT WITH OR WITHOUT PRESENCE OF ULCERS (sore throat, sores in mouth, or a toothache) UNUSUAL RASH, SWELLING OR PAIN  UNUSUAL VAGINAL DISCHARGE OR ITCHING   Items with * indicate a potential emergency and should be followed up as soon as possible or go to the Emergency Department if any problems should occur.  Please show the CHEMOTHERAPY ALERT CARD or IMMUNOTHERAPY ALERT CARD at  check-in to the Emergency Department and triage nurse.  Should you have questions after your visit or need to cancel or reschedule your appointment, please contact [facility]  Dept: [phone]  and follow the prompts.  Office hours are 8:00 a.m. to 4:30 p.m. Monday - Friday. Please note that voicemails left after 4:00 p.m. may not be returned until the following business day.  We are closed weekends and major holidays. You have access to a nurse at all times for urgent questions. Please call the main number to the clinic Dept: [phone] and follow the prompts.   For any non-urgent questions, you may also contact your provider using MyChart. We now offer e-Visits for anyone 18 and older to request care online for non-urgent symptoms. For details visit [email or web address].   Also download the MyChart app! Go to the app store, search "MyChart", open the app, select , and log in with your MyChart username and password.  Masks are optional in the cancer centers. If you would like for your care team to wear a mask while they are taking care of you, please let them know. You may have one support person who is at least 71 years old accompany you for your appointments. 

## 2022-02-23 ENCOUNTER — Ambulatory Visit: Payer: Medicare Other

## 2022-02-23 ENCOUNTER — Ambulatory Visit: Payer: Medicare Other | Admitting: Hematology and Oncology

## 2022-02-23 ENCOUNTER — Other Ambulatory Visit: Payer: Medicare Other

## 2022-02-23 LAB — KAPPA/LAMBDA LIGHT CHAINS
Kappa free light chain: 303.4 mg/L — ABNORMAL HIGH (ref 3.3–19.4)
Kappa, lambda light chain ratio: 101.13 — ABNORMAL HIGH (ref 0.26–1.65)
Lambda free light chains: 3 mg/L — ABNORMAL LOW (ref 5.7–26.3)

## 2022-02-25 ENCOUNTER — Encounter: Payer: Self-pay | Admitting: Internal Medicine

## 2022-02-25 ENCOUNTER — Encounter: Payer: Self-pay | Admitting: Hematology and Oncology

## 2022-02-25 LAB — MULTIPLE MYELOMA PANEL, SERUM
Albumin SerPl Elph-Mcnc: 3.2 g/dL (ref 2.9–4.4)
Albumin/Glob SerPl: 1.6 (ref 0.7–1.7)
Alpha 1: 0.3 g/dL (ref 0.0–0.4)
Alpha2 Glob SerPl Elph-Mcnc: 0.7 g/dL (ref 0.4–1.0)
B-Globulin SerPl Elph-Mcnc: 0.9 g/dL (ref 0.7–1.3)
Gamma Glob SerPl Elph-Mcnc: 0.2 g/dL — ABNORMAL LOW (ref 0.4–1.8)
Globulin, Total: 2.1 g/dL — ABNORMAL LOW (ref 2.2–3.9)
IgA: 44 mg/dL — ABNORMAL LOW (ref 61–437)
IgG (Immunoglobin G), Serum: 302 mg/dL — ABNORMAL LOW (ref 603–1613)
IgM (Immunoglobulin M), Srm: 9 mg/dL — ABNORMAL LOW (ref 20–172)
M Protein SerPl Elph-Mcnc: 0.1 g/dL — ABNORMAL HIGH
Total Protein ELP: 5.3 g/dL — ABNORMAL LOW (ref 6.0–8.5)

## 2022-02-27 DIAGNOSIS — G4733 Obstructive sleep apnea (adult) (pediatric): Secondary | ICD-10-CM | POA: Diagnosis not present

## 2022-02-27 DIAGNOSIS — C9000 Multiple myeloma not having achieved remission: Secondary | ICD-10-CM | POA: Diagnosis not present

## 2022-02-27 DIAGNOSIS — I5032 Chronic diastolic (congestive) heart failure: Secondary | ICD-10-CM | POA: Diagnosis not present

## 2022-02-27 DIAGNOSIS — J9811 Atelectasis: Secondary | ICD-10-CM | POA: Diagnosis not present

## 2022-02-27 DIAGNOSIS — J90 Pleural effusion, not elsewhere classified: Secondary | ICD-10-CM | POA: Diagnosis not present

## 2022-02-27 DIAGNOSIS — Z7901 Long term (current) use of anticoagulants: Secondary | ICD-10-CM | POA: Diagnosis not present

## 2022-02-27 DIAGNOSIS — Z1152 Encounter for screening for COVID-19: Secondary | ICD-10-CM | POA: Diagnosis not present

## 2022-02-27 DIAGNOSIS — I110 Hypertensive heart disease with heart failure: Secondary | ICD-10-CM | POA: Diagnosis not present

## 2022-02-27 DIAGNOSIS — R7303 Prediabetes: Secondary | ICD-10-CM | POA: Diagnosis not present

## 2022-02-27 DIAGNOSIS — E876 Hypokalemia: Secondary | ICD-10-CM | POA: Diagnosis not present

## 2022-02-27 DIAGNOSIS — G6289 Other specified polyneuropathies: Secondary | ICD-10-CM | POA: Diagnosis not present

## 2022-02-27 DIAGNOSIS — Z888 Allergy status to other drugs, medicaments and biological substances status: Secondary | ICD-10-CM | POA: Diagnosis not present

## 2022-02-27 DIAGNOSIS — I4891 Unspecified atrial fibrillation: Secondary | ICD-10-CM | POA: Diagnosis not present

## 2022-02-27 DIAGNOSIS — J8489 Other specified interstitial pulmonary diseases: Secondary | ICD-10-CM | POA: Diagnosis not present

## 2022-02-27 DIAGNOSIS — I480 Paroxysmal atrial fibrillation: Secondary | ICD-10-CM | POA: Diagnosis not present

## 2022-02-27 DIAGNOSIS — E785 Hyperlipidemia, unspecified: Secondary | ICD-10-CM | POA: Diagnosis not present

## 2022-02-27 DIAGNOSIS — R54 Age-related physical debility: Secondary | ICD-10-CM | POA: Diagnosis not present

## 2022-02-27 DIAGNOSIS — D72810 Lymphocytopenia: Secondary | ICD-10-CM | POA: Diagnosis not present

## 2022-02-27 DIAGNOSIS — R918 Other nonspecific abnormal finding of lung field: Secondary | ICD-10-CM | POA: Diagnosis not present

## 2022-02-27 DIAGNOSIS — Z881 Allergy status to other antibiotic agents status: Secondary | ICD-10-CM | POA: Diagnosis not present

## 2022-02-27 DIAGNOSIS — I509 Heart failure, unspecified: Secondary | ICD-10-CM | POA: Diagnosis not present

## 2022-02-28 DIAGNOSIS — I509 Heart failure, unspecified: Secondary | ICD-10-CM | POA: Diagnosis not present

## 2022-02-28 DIAGNOSIS — I4891 Unspecified atrial fibrillation: Secondary | ICD-10-CM | POA: Diagnosis not present

## 2022-03-01 ENCOUNTER — Telehealth: Payer: Self-pay

## 2022-03-01 DIAGNOSIS — I4891 Unspecified atrial fibrillation: Secondary | ICD-10-CM | POA: Diagnosis not present

## 2022-03-01 NOTE — Telephone Encounter (Signed)
Received phone call from patient. Patient was in [place] for the weekend. He started Revlimid on 10/27, he took second dose on 02/27/22 and then was admitted to the hospital that same day for atrial fibrillation.  He did not take his Revlimid on 10/29 or 10/30. He has one dose left but will not be returning to [facility] until 03/03/22. He will take remaining 19 tablets starting on 03/03/22.  Patient just wanted to let clinic know.

## 2022-03-03 ENCOUNTER — Encounter: Payer: Self-pay | Admitting: Internal Medicine

## 2022-03-04 DIAGNOSIS — M84421D Pathological fracture, right humerus, subsequent encounter for fracture with routine healing: Secondary | ICD-10-CM | POA: Diagnosis not present

## 2022-03-04 DIAGNOSIS — M25611 Stiffness of right shoulder, not elsewhere classified: Secondary | ICD-10-CM | POA: Diagnosis not present

## 2022-03-04 DIAGNOSIS — M6281 Muscle weakness (generalized): Secondary | ICD-10-CM | POA: Diagnosis not present

## 2022-03-05 ENCOUNTER — Ambulatory Visit (HOSPITAL_COMMUNITY)
Admission: RE | Admit: 2022-03-05 | Discharge: 2022-03-05 | Disposition: A | Discharge status: Home / Self Care | Payer: Medicare Other | Source: Ambulatory Visit | Attending: Nurse Practitioner | Admitting: Nurse Practitioner

## 2022-03-05 VITALS — BP 142/64 | HR 55 | Ht 70.0 in | Wt 250.6 lb

## 2022-03-05 DIAGNOSIS — Z7901 Long term (current) use of anticoagulants: Secondary | ICD-10-CM | POA: Insufficient documentation

## 2022-03-05 DIAGNOSIS — Z923 Personal history of irradiation: Secondary | ICD-10-CM | POA: Insufficient documentation

## 2022-03-05 DIAGNOSIS — I4892 Unspecified atrial flutter: Secondary | ICD-10-CM | POA: Insufficient documentation

## 2022-03-05 DIAGNOSIS — Z6836 Body mass index (BMI) 36.0-36.9, adult: Secondary | ICD-10-CM | POA: Diagnosis not present

## 2022-03-05 DIAGNOSIS — Z9221 Personal history of antineoplastic chemotherapy: Secondary | ICD-10-CM | POA: Diagnosis not present

## 2022-03-05 DIAGNOSIS — C9000 Multiple myeloma not having achieved remission: Secondary | ICD-10-CM | POA: Diagnosis not present

## 2022-03-05 DIAGNOSIS — Z86718 Personal history of other venous thrombosis and embolism: Secondary | ICD-10-CM | POA: Insufficient documentation

## 2022-03-05 DIAGNOSIS — I480 Paroxysmal atrial fibrillation: Secondary | ICD-10-CM

## 2022-03-05 DIAGNOSIS — I4819 Other persistent atrial fibrillation: Secondary | ICD-10-CM | POA: Insufficient documentation

## 2022-03-05 DIAGNOSIS — Z79899 Other long term (current) drug therapy: Secondary | ICD-10-CM | POA: Insufficient documentation

## 2022-03-05 DIAGNOSIS — E669 Obesity, unspecified: Secondary | ICD-10-CM | POA: Diagnosis not present

## 2022-03-05 DIAGNOSIS — I10 Essential (primary) hypertension: Secondary | ICD-10-CM | POA: Diagnosis not present

## 2022-03-05 DIAGNOSIS — G4733 Obstructive sleep apnea (adult) (pediatric): Secondary | ICD-10-CM | POA: Diagnosis not present

## 2022-03-05 DIAGNOSIS — D6869 Other thrombophilia: Secondary | ICD-10-CM | POA: Insufficient documentation

## 2022-03-05 MED ORDER — FLECAINIDE ACETATE 50 MG PO TABS: 100.0000 mg | ORAL_TABLET | Freq: Two times a day (BID) | ORAL | Status: DC

## 2022-03-05 NOTE — Progress Notes (Signed)
Primary Care Physician: Lajean Manes, MD Primary Cardiologist: Dr Harrington Challenger Primary Electrophysiologist: Dr Rayann Heman (remotely) Referring Physician: Dr [name] is a 71 y.o. male with a history of HTN, OSA, prior DVT, MGUS, atrial fibrillation who presents for follow up in the [facility].  The patient underwent afib ablation in 2018 with Dr Rayann Heman and has been maintained on flecainide. Patient is on Eliquis for a CHADS2VASC score of 2. Patient has been undergoing treatment for multiple myloma, finishing radiation but continuing chemotherapy.   He is now in the office as he went to [facility] over the weekend and became presyncopal and found out he was afib with RVR. He was also fluid overloaded with 12 lbs of fluid . He was  admitted 10/28 and   thru Sunday 10/29. Pt states he was diuresed 12 lbs of fluid. He was not aware that he was fluid overloaded. He had his last chemo 10/17. He had BB stopped as he was slightly lightheaded and noted a HR in the 40's. He was placed back on metoprolol 25 mg tid and was d/c in rate controlled afib. He did return to [place] on Monday and decreased metoprolol to bid at that point. He is in sinus brady at 55 bpm. He has continued on eliquis 5 mg bid. He is interested in establishing with another EP in Dr. Jackalyn Lombard absence to discuss ablation. Echo was repeated there w/o significant findings.   Today, he denies symptoms of chest pain, shortness of breath, orthopnea, PND, lower extremity edema, dizziness, presyncope, syncope, bleeding, or neurologic sequela. The patient is tolerating medications without difficulties and is otherwise without complaint today.    Atrial Fibrillation Risk Factors:  he does have symptoms or diagnosis of sleep apnea. he is compliant with CPAP therapy. he does not have a history of rheumatic fever. he does have a history of alcohol use.   he has a BMI of Body mass index is 36.13 kg/m.[name] Filed Weights    01/11/22 1042  Weight: 114.2 kg    Family History  Problem Relation Age of Onset   Dementia Mother    Stroke Father    Atrial fibrillation Father    Hypertension Father    Hypertension Paternal Grandfather    Dementia Maternal Grandmother      Atrial Fibrillation Management history:  Previous antiarrhythmic drugs: flecainide  Previous cardioversions: none Previous ablations: 2018 CHADS2VASC score: 2 Anticoagulation history: Eliquis   Past Medical History:  Diagnosis Date   Arthritis    "comes and goes; mostly in hands, occasionally elbow" (88/09/275)   Complication of anesthesia    "just wanted to sleep alot  for couple days S/P VARICOCELE OR" (04/05/2017)   DVT (deep venous thrombosis) (HCC)    LLE   Dysrhythmia    PVCs    GERD (gastroesophageal reflux disease)    History of hiatal hernia    Hypertension    Impaired glucose tolerance    Multiple myeloma (HCC)    Obesity (BMI 30-39.9) 07/26/2016   OSA on CPAP 07/26/2016   Mild with AHI 12.5/hr now on CPAP at 14cm H2O   PAF (paroxysmal atrial fibrillation) ([place])    s/p PVI Ablation in 2018 // Flecainide Rx // Echo 8/21: EF 60-65, no RWMA, mild LVH, normal RV SF, moderate RVE, mild BAE, trivial MR, mild dilation of aortic root (40 mm)   Pneumonia    "may have had walking pneumonia a few years ago" (04/05/2017)  Pre-diabetes    Thrombophlebitis    Past Surgical History:  Procedure Laterality Date   ATRIAL FIBRILLATION ABLATION  04/05/2017   ATRIAL FIBRILLATION ABLATION N/A 04/05/2017   Procedure: ATRIAL FIBRILLATION ABLATION;  Surgeon: Thompson Grayer, MD;  Location: [facility];  Service: Cardiovascular;  Laterality: N/A;   BONE BIOPSY Right 12/17/2021   Procedure: BONE BIOPSY;  Surgeon: Hiram Gash, MD;  Location: [place];  Service: Orthopedics;  Laterality: Right;   COMPLETE RIGHT HIP REPLACMENT  01/19/2019   EVALUATION UNDER ANESTHESIA WITH HEMORRHOIDECTOMY N/A 02/24/2017   Procedure:  EXAM UNDER ANESTHESIA WITH  HEMORRHOIDECTOMY;  Surgeon: Michael Boston, MD;  Location: [facility];  Service: General;  Laterality: N/A;   EXCISIONAL TOTAL HIP ARTHROPLASTY WITH ANTIBIOTIC SPACERS Right 09/25/2020   Procedure: EXCISIONAL TOTAL HIP ARTHROPLASTY WITH ANTIBIOTIC SPACERS;  Surgeon: Paralee Cancel, MD;  Location: [facility];  Service: Orthopedics;  Laterality: Right;   FLEXIBLE SIGMOIDOSCOPY N/A 04/15/2015   Procedure:  UNSEDATED FLEXIBLE SIGMOIDOSCOPY;  Surgeon: [name], MD;  Location: [facility];  Service: Endoscopy;  Laterality: N/A;   HUMERUS IM NAIL Right 12/17/2021   Procedure: INTRAMEDULLARY (IM) NAIL HUMERAL;  Surgeon: Hiram Gash, MD;  Location: [place];  Service: Orthopedics;  Laterality: Right;   KNEE ARTHROSCOPY Left ~ 2016   REIMPLANTATION OF TOTAL HIP Right 12/25/2020   Procedure: REIMPLANTATION/REVISION OF RIGHT TOTAL HIP VERSUS REPEAT IRRIGATION AND DEBRIDEMENT;  Surgeon: Paralee Cancel, MD;  Location: [facility];  Service: Orthopedics;  Laterality: Right;   TESTICLE SURGERY  1988   VARICOCELE   TONSILLECTOMY     VARICOSE VEIN SURGERY Bilateral     Current Outpatient Medications  Medication Sig Dispense Refill   Acetaminophen (TYLENOL EXTRA STRENGTH PO) Take 2 tablets by mouth as needed (For pain).     acyclovir (ZOVIRAX) 400 MG tablet Take 1 tablet (400 mg total) by mouth 2 (two) times daily. 180 tablet 3   amLODipine (NORVASC) 10 MG tablet Take 10 mg by mouth daily.      apixaban (ELIQUIS) 5 MG TABS tablet TAKE ONE TABLET BY MOUTH TWICE A DAY 60 tablet 6   Ascorbic Acid (VITAMIN C) 100 MG tablet Take 100 mg by mouth daily.     cholecalciferol (VITAMIN D3) 25 MCG (1000 UNIT) tablet Take 1,000 Units by mouth daily.     clobetasol cream (TEMOVATE) 2.90 % Apply 1 Application topically as needed.     Cyanocobalamin (VITAMIN B-12 PO) Take 1 tablet by mouth daily. Not sure the dosage     dexamethasone (DECADRON) 4 MG tablet Take 10 tablets (40 mg total) by  mouth once a week. 40 tablet 5   Emollient (UDDERLY SMOOTH) CREA Apply topically daily.     flecainide (TAMBOCOR) 50 MG tablet TAKE ONE AND ONE-HALF TABLETS BY MOUTH TWICE A DAY 270 tablet 3   fluticasone (FLONASE) 50 MCG/ACT nasal spray Place 1 spray into both nostrils daily as needed for allergies or rhinitis.     folic acid (FOLVITE) 1 MG tablet Take 1 mg by mouth daily.     gabapentin (NEURONTIN) 300 MG capsule TAKE TWO CAPSULES BY MOUTH TWICE A DAY 360 capsule 0   hydrALAZINE (APRESOLINE) 10 MG tablet Take 1 tablet (10 mg total) by mouth 2 (two) times daily. AS NEEDED FOR HIGH BP 180 tablet 3   hydrochlorothiazide (HYDRODIURIL) 25 MG tablet TAKE ONE TABLET BY MOUTH ONE TIME DAILY 90 tablet 3   lenalidomide (REVLIMID) 25 MG capsule  Take 1 capsule (25 mg total) by mouth daily. Take 21 days on, 7 days off, repeat every 28 days. [employer] auth # [id number] Date obtained: 12/25/21 21 capsule 0   lisinopril (ZESTRIL) 40 MG tablet TAKE ONE TABLET BY MOUTH ONE TIME DAILY 90 tablet 3   metoprolol succinate (TOPROL-XL) 50 MG 24 hr tablet Take 0.5 tablets (25 mg total) by mouth daily. 90 tablet 3   oxymetazoline (AFRIN) 0.05 % nasal spray Place 1 spray into both nostrils 2 (two) times daily as needed for congestion.     potassium chloride SA (KLOR-CON M) 20 MEQ tablet TAKE ONE TABLET BY MOUTH ONE TIME DAILY, TAKE 40 MG (2 TABLETS) ON DAYS THAT YOU ALSO TAKE LASIX (FUROSEMIDE) 135 tablet 3   prochlorperazine (COMPAZINE) 10 MG tablet Take 1 tablet (10 mg total) by mouth every 6 (six) hours as needed for nausea or vomiting. 30 tablet 0   psyllium (METAMUCIL) 58.6 % packet Take 1 packet by mouth as needed.     sodium chloride (OCEAN) 0.65 % SOLN nasal spray Place 1 spray into both nostrils as needed for congestion.     triamcinolone cream (KENALOG) 0.1 % Apply 1 application topically daily as needed (itching).     furosemide (LASIX) 40 MG tablet Take 1 tablet (40 mg) by mouth every other day alternating with 1 and  1/2 tablets (60 mg) on the opposite days. (Patient not taking: Reported on 01/11/2022) 113 tablet 3   No current facility-administered medications for this encounter.    Allergies  Allergen Reactions   Linezolid Other (See Comments)    Burning tongue    Vancomycin Rash    Social History   Socioeconomic History   Marital status: Married    Spouse name: Not on file   Number of children: 1   Years of education: law school   Highest education level: Not on file  Occupational History   Occupation: lawyer  Tobacco Use   Smoking status: Never   Smokeless tobacco: Never  Vaping Use   Vaping Use: Never used  Substance and Sexual Activity   Alcohol use: Yes    Alcohol/week: 2.0 standard drinks of alcohol    Types: 2 Glasses of wine per week    Comment: 2 glasses of wine a week, occ bourbon, beer   Drug use: No   Sexual activity: Not Currently  Other Topics Concern   Not on file  Social History Narrative   Lives in [place] with spouse in a 2 story home.  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    Social Determinants of Health   Financial Resource Strain: Not on file  Food Insecurity: Not on file  Transportation Needs: Not on file  Physical Activity: Not on file  Stress: Not on file  Social Connections: Not on file  Intimate Partner Violence: Not on file     ROS- All systems are reviewed and negative except as per the HPI above.  Physical Exam: Vitals:   01/11/22 1042  BP: 138/66  Pulse: 98  Weight: 114.2 kg  Height: _0  (1.778 m)    GEN- The patient is a well appearing obese male, alert and oriented x 3 today.   Head- normocephalic, atraumatic Eyes-  Sclera clear, conjunctiva pink Ears- hearing intact Oropharynx- clear Neck- supple  Lungs- Clear to ausculation bilaterally, normal work of breathing Heart- irregular rate and rhythm, no murmurs, rubs  or gallops  GI- soft, NT, ND, +  BS Extremities- no clubbing, cyanosis, or edema, R arm in sling MS- no significant deformity or atrophy Skin- no rash or lesion Psych- euthymic mood, full affect Neuro- strength and sensation are intact  Wt Readings from Last 3 Encounters:  01/11/22 114.2 kg  01/05/22 115.3 kg  12/28/21 115.4 kg    EKG today demonstrates Vent. rate 55 BPM PR interval 196 ms QRS duration 90 ms QT/QTcB 464/443 ms P-R-T axes 66 95 68 Sinus bradycardia Rightward axis Borderline ECG When compared with ECG of 11-Jan-2022 10:59, PREVIOUS ECG IS PRESENT    Echo -01/2022  FINDINGS: Left Heart: --There is no left atrial dilatation (LA volume index 24 ml/m). --The interventricular septum is normal in thickness. The inferolateral (posterior) wall is normal. There is no asymmetric septal hypertrophy. There is no left ventricular hypertrophy. The left ventricle has normal end-diastolic diameter. --LV analysis by 2D Simpson's method: LV ejection fraction is normal (59 %). --Global LV wall motion is normal. LV ejection fraction is normal. Mitral Valve: --The mitral valve is normal. There is no mitral stenosis. There is trace mitral regurgitation. Aortic Valve: --The aortic valve is normal. Aortic valve is trileaflet. There is no aortic stenosis. There is no aortic regurgitation.   Epic records are reviewed at length today  CHA2DS2-VASc Score = 2  The patient's score is based upon: CHF History: 0 HTN History: 1 Diabetes History: 0 Stroke History: 0 Vascular Disease History: 0 Age Score: 1 Gender Score: 0       ASSESSMENT AND PLAN: 1. Persistent Atrial Fibrillation/atrial flutter The patient's CHA2DS2-VASc score is 2, indicating a 2.2% annual risk of stroke.   Patient had been in [place] until recent trip to [facility] this past weekend , BB increased and pt quickly returned to [place] He would like to have another ablation Will refer to Dr. Quentin Ore to discuss Continue flecainide 75 mg bid  Continue  metoprolol tartrate 25 mg bid  Continue Eliquis 5 mg BID  2. Secondary Hypercoagulable State (ICD10:  D68.69) The patient is at significant risk for stroke/thromboembolism based upon his CHA2DS2-VASc Score of 2.  Continue Apixaban (Eliquis).   3. Obesity Body mass index is 36.13 kg/m. Lifestyle modification was discussed at length including regular exercise and weight reduction.  4. Obstructive sleep apnea Encouraged compliance with CPAP therapy.  5. HTN Stable, med changes as above.   6. Multiple Myeloma Followed by Dr Lorenso Courier  7. Recent 12 lb fluid weight gain Normovolemic today  Diuresed in hospital  Watch weight daily Use lasix as needed if weight increases by 5 lbs in 1-2 days    Requested consult with Dr. Barnett Hatter C. Brittnei Jagiello, [facility] [address] [place], [place] [address] [phone]

## 2022-03-08 ENCOUNTER — Inpatient Hospital Stay: Payer: Medicare Other

## 2022-03-08 ENCOUNTER — Inpatient Hospital Stay: Payer: Medicare Other | Attending: Hematology and Oncology

## 2022-03-08 ENCOUNTER — Inpatient Hospital Stay (HOSPITAL_BASED_OUTPATIENT_CLINIC_OR_DEPARTMENT_OTHER): Payer: Medicare Other | Admitting: Hematology and Oncology

## 2022-03-08 ENCOUNTER — Other Ambulatory Visit: Payer: Self-pay

## 2022-03-08 VITALS — BP 126/60 | HR 51 | Temp 97.8°F | Resp 16 | Ht 70.0 in | Wt 249.5 lb

## 2022-03-08 DIAGNOSIS — Z7289 Other problems related to lifestyle: Secondary | ICD-10-CM | POA: Insufficient documentation

## 2022-03-08 DIAGNOSIS — Z7961 Long term (current) use of immunomodulator: Secondary | ICD-10-CM | POA: Diagnosis not present

## 2022-03-08 DIAGNOSIS — Z79624 Long term (current) use of inhibitors of nucleotide synthesis: Secondary | ICD-10-CM | POA: Insufficient documentation

## 2022-03-08 DIAGNOSIS — R21 Rash and other nonspecific skin eruption: Secondary | ICD-10-CM | POA: Insufficient documentation

## 2022-03-08 DIAGNOSIS — Z823 Family history of stroke: Secondary | ICD-10-CM | POA: Diagnosis not present

## 2022-03-08 DIAGNOSIS — Z86718 Personal history of other venous thrombosis and embolism: Secondary | ICD-10-CM | POA: Insufficient documentation

## 2022-03-08 DIAGNOSIS — Z8249 Family history of ischemic heart disease and other diseases of the circulatory system: Secondary | ICD-10-CM | POA: Insufficient documentation

## 2022-03-08 DIAGNOSIS — Z7901 Long term (current) use of anticoagulants: Secondary | ICD-10-CM | POA: Diagnosis not present

## 2022-03-08 DIAGNOSIS — I480 Paroxysmal atrial fibrillation: Secondary | ICD-10-CM | POA: Diagnosis not present

## 2022-03-08 DIAGNOSIS — Z5112 Encounter for antineoplastic immunotherapy: Secondary | ICD-10-CM | POA: Diagnosis not present

## 2022-03-08 DIAGNOSIS — Z818 Family history of other mental and behavioral disorders: Secondary | ICD-10-CM | POA: Insufficient documentation

## 2022-03-08 DIAGNOSIS — C9000 Multiple myeloma not having achieved remission: Secondary | ICD-10-CM | POA: Diagnosis not present

## 2022-03-08 DIAGNOSIS — Z881 Allergy status to other antibiotic agents status: Secondary | ICD-10-CM | POA: Diagnosis not present

## 2022-03-08 DIAGNOSIS — R0781 Pleurodynia: Secondary | ICD-10-CM | POA: Insufficient documentation

## 2022-03-08 DIAGNOSIS — Z79899 Other long term (current) drug therapy: Secondary | ICD-10-CM | POA: Diagnosis not present

## 2022-03-08 DIAGNOSIS — M25512 Pain in left shoulder: Secondary | ICD-10-CM | POA: Diagnosis not present

## 2022-03-08 DIAGNOSIS — M25611 Stiffness of right shoulder, not elsewhere classified: Secondary | ICD-10-CM | POA: Diagnosis not present

## 2022-03-08 DIAGNOSIS — E876 Hypokalemia: Secondary | ICD-10-CM | POA: Diagnosis not present

## 2022-03-08 DIAGNOSIS — M6281 Muscle weakness (generalized): Secondary | ICD-10-CM | POA: Diagnosis not present

## 2022-03-08 DIAGNOSIS — M84421D Pathological fracture, right humerus, subsequent encounter for fracture with routine healing: Secondary | ICD-10-CM | POA: Diagnosis not present

## 2022-03-08 LAB — CBC WITH DIFFERENTIAL (CANCER CENTER ONLY)
Abs Immature Granulocytes: 0.02 10*3/uL (ref 0.00–0.07)
Basophils Absolute: 0 10*3/uL (ref 0.0–0.1)
Basophils Relative: 0 %
Eosinophils Absolute: 0.3 10*3/uL (ref 0.0–0.5)
Eosinophils Relative: 4 %
HCT: 39.5 % (ref 39.0–52.0)
Hemoglobin: 13.8 g/dL (ref 13.0–17.0)
Immature Granulocytes: 0 %
Lymphocytes Relative: 3 %
Lymphs Abs: 0.2 10*3/uL — ABNORMAL LOW (ref 0.7–4.0)
MCH: 32.5 pg (ref 26.0–34.0)
MCHC: 34.9 g/dL (ref 30.0–36.0)
MCV: 93.2 fL (ref 80.0–100.0)
Monocytes Absolute: 0.4 10*3/uL (ref 0.1–1.0)
Monocytes Relative: 6 %
Neutro Abs: 6.5 10*3/uL (ref 1.7–7.7)
Neutrophils Relative %: 87 %
Platelet Count: 187 10*3/uL (ref 150–400)
RBC: 4.24 MIL/uL (ref 4.22–5.81)
RDW: 14.8 % (ref 11.5–15.5)
WBC Count: 7.5 10*3/uL (ref 4.0–10.5)
nRBC: 0 % (ref 0.0–0.2)

## 2022-03-08 LAB — CMP (CANCER CENTER ONLY)
ALT: 22 U/L (ref 0–44)
AST: 16 U/L (ref 15–41)
Albumin: 4 g/dL (ref 3.5–5.0)
Alkaline Phosphatase: 127 U/L — ABNORMAL HIGH (ref 38–126)
Anion gap: 7 (ref 5–15)
BUN: 14 mg/dL (ref 8–23)
CO2: 30 mmol/L (ref 22–32)
Calcium: 9 mg/dL (ref 8.9–10.3)
Chloride: 104 mmol/L (ref 98–111)
Creatinine: 0.84 mg/dL (ref 0.61–1.24)
GFR, Estimated: >60 mL/min (ref >60)
Glucose, Bld: 114 mg/dL — ABNORMAL HIGH (ref 70–99)
Potassium: 3.7 mmol/L (ref 3.5–5.1)
Sodium: 141 mmol/L (ref 135–145)
Total Bilirubin: 0.5 mg/dL (ref 0.3–1.2)
Total Protein: 6.1 g/dL — ABNORMAL LOW (ref 6.5–8.1)

## 2022-03-08 LAB — LACTATE DEHYDROGENASE: LDH: 171 U/L (ref 98–192)

## 2022-03-08 MED ORDER — DIPHENHYDRAMINE HCL 25 MG PO CAPS
50.0000 mg | ORAL_CAPSULE | Freq: Once | ORAL | Status: AC
  Administered 2022-03-08: 50 mg via ORAL
  Filled 2022-03-08: qty 2

## 2022-03-08 MED ORDER — DARATUMUMAB-HYALURONIDASE-FIHJ 1800-30000 MG-UT/15ML ~~LOC~~ SOLN
1800.0000 mg | Freq: Once | SUBCUTANEOUS | Status: AC
  Administered 2022-03-08: 1800 mg via SUBCUTANEOUS
  Filled 2022-03-08: qty 15

## 2022-03-08 MED ORDER — ACETAMINOPHEN 325 MG PO TABS
650.0000 mg | ORAL_TABLET | Freq: Once | ORAL | Status: AC
  Administered 2022-03-08: 650 mg via ORAL
  Filled 2022-03-08: qty 2

## 2022-03-08 NOTE — Progress Notes (Signed)
[place] [phone]   [phone]) [phone]  PROGRESS NOTE  Patient Care Team: Lajean Manes, MD as PCP - General (Internal Medicine) Fay Records, MD as PCP - Cardiology (Cardiology) Sueanne Margarita, MD as PCP - Sleep Medicine (Cardiology) Thompson Grayer, MD as PCP - Electrophysiology (Cardiology) Michael Boston, MD as Consulting Physician (General Surgery) Clarene Essex, MD as Consulting Physician (Gastroenterology) Sueanne Margarita, MD as Consulting Physician (Sleep Medicine) Alda Berthold, DO as Consulting Physician (Neurology)  Hematological/Oncological History # IgA Kappa Multiple Myeloma 05/05/2020: Bone marrow biopsy showed increased number of plasma cells representing 14% of all cells in the aspirate associated with numerous variably sized clusters in the clot and biopsy sections 04/10/2021: M protein 0.3, IgA kappa specificity. Kappa 580.5, Labda 7.0, ratio 82.93.   12/07/2021: Labs show of 1971.3, lambda 5.5, ratio 358.42 12/10/2021: Transition care to Dr. Lorenso Courier. 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  01/26/2022: Cycle 2 Day 1 of Dara/Rev/Dex 02/22/2022: Cycle 3 Day 1 of Dara/Rev/Dex  Interval History:  Travis [name] 71 y.o. male with medical history significant for IgA Kappa Multiple Myeloma who presents for a follow up visit. The patient's last visit was on 02/22/2022. In the interim since the last visit he has continued of Dara/Rev/Dex.  On exam today Travis [name] is accompanied by his wife.  He reports he has developed a mild rash on his left leg.  There is also some mild rash on his right leg but far less pronounced.  The rash on his back has nearly resolved.  He reports that during his trip up to [place] he did miss 3 doses of Revlimid.  He does report having some occasional clamminess and sweats late in the afternoon  after receiving steroid therapy.  It typically occurs between 4 and 8 PM.  He notes that he has been eating well though he has lost 4 pounds in the interim since our last visit.  He notes that he did have a recent atrial fibrillation episode and a second ablation was recommended.  He will be meeting with his cardiologist again in January 2024 to discuss this further.  He denies any fevers, chills, sweats, shortness of breath, chest pain.  A full 10 point ROS is listed below.  Overall he is willing and able to proceed with treatment today.  MEDICAL HISTORY:  Past Medical History:  Diagnosis Date   Arthritis    "comes and goes; mostly in hands, occasionally elbow" (90/07/90)   Complication of anesthesia    "just wanted to sleep alot  for couple days S/P VARICOCELE OR" (04/05/2017)   DVT (deep venous thrombosis) (HCC)    LLE   Dysrhythmia    PVCs    GERD (gastroesophageal reflux disease)    History of hiatal hernia    Hypertension    Impaired glucose tolerance    Multiple myeloma (HCC)    Obesity (BMI 30-39.9) 07/26/2016   OSA on CPAP 07/26/2016   Mild with AHI 12.5/hr now on CPAP at 14cm H2O   PAF (paroxysmal atrial fibrillation) ([facility] [place])    s/p PVI Ablation in 2018 // Flecainide Rx // Echo 8/21: EF 60-65, no RWMA, mild LVH, normal RV SF, moderate RVE, mild BAE, trivial MR, mild dilation of aortic root (40 mm)   Pneumonia    "may have had walking pneumonia a few years ago" (04/05/2017)   Pre-diabetes  Thrombophlebitis     SURGICAL HISTORY: Past Surgical History:  Procedure Laterality Date   ATRIAL FIBRILLATION ABLATION  04/05/2017   ATRIAL FIBRILLATION ABLATION N/A 04/05/2017   Procedure: ATRIAL FIBRILLATION ABLATION;  Surgeon: Thompson Grayer, MD;  Location: [facility];  Service: Cardiovascular;  Laterality: N/A;   BONE BIOPSY Right 12/17/2021   Procedure: BONE BIOPSY;  Surgeon: Hiram Gash, MD;  Location: [facility];  Service: Orthopedics;  Laterality: Right;    COMPLETE RIGHT HIP REPLACMENT  01/19/2019   EVALUATION UNDER ANESTHESIA WITH HEMORRHOIDECTOMY N/A 02/24/2017   Procedure: EXAM UNDER ANESTHESIA WITH  HEMORRHOIDECTOMY;  Surgeon: Michael Boston, MD;  Location: [facility];  Service: General;  Laterality: N/A;   EXCISIONAL TOTAL HIP ARTHROPLASTY WITH ANTIBIOTIC SPACERS Right 09/25/2020   Procedure: EXCISIONAL TOTAL HIP ARTHROPLASTY WITH ANTIBIOTIC SPACERS;  Surgeon: Paralee Cancel, MD;  Location: [facility];  Service: Orthopedics;  Laterality: Right;   FLEXIBLE SIGMOIDOSCOPY N/A 04/15/2015   Procedure:  UNSEDATED FLEXIBLE SIGMOIDOSCOPY;  Surgeon: [name], MD;  Location: [facility];  Service: Endoscopy;  Laterality: N/A;   HUMERUS IM NAIL Right 12/17/2021   Procedure: INTRAMEDULLARY (IM) NAIL HUMERAL;  Surgeon: Hiram Gash, MD;  Location: [facility];  Service: Orthopedics;  Laterality: Right;   KNEE ARTHROSCOPY Left ~ 2016   REIMPLANTATION OF TOTAL HIP Right 12/25/2020   Procedure: REIMPLANTATION/REVISION OF RIGHT TOTAL HIP VERSUS REPEAT IRRIGATION AND DEBRIDEMENT;  Surgeon: Paralee Cancel, MD;  Location: [facility];  Service: Orthopedics;  Laterality: Right;   TESTICLE SURGERY  1988   VARICOCELE   TONSILLECTOMY     VARICOSE VEIN SURGERY Bilateral     SOCIAL HISTORY: Social History   Socioeconomic History   Marital status: Married    Spouse name: Not on file   Number of children: 1   Years of education: law school   Highest education level: Not on file  Occupational History   Occupation: lawyer  Tobacco Use   Smoking status: Never   Smokeless tobacco: Never  Vaping Use   Vaping Use: Never used  Substance and Sexual Activity   Alcohol use: Yes    Alcohol/week: 2.0 standard drinks of alcohol    Types: 2 Glasses of wine per week    Comment: 2 glasses of wine a week, occ bourbon, beer   Drug use: No   Sexual activity: Not Currently  Other Topics Concern   Not on file  Social History Narrative   Lives in [place]  with spouse in a 2 story home.  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    Social Determinants of Health   Financial Resource Strain: Not on file  Food Insecurity: Not on file  Transportation Needs: Not on file  Physical Activity: Not on file  Stress: Not on file  Social Connections: Not on file  Intimate Partner Violence: Not on file    FAMILY HISTORY: Family History  Problem Relation Age of Onset   Dementia Mother    Stroke Father    Atrial fibrillation Father    Hypertension Father    Hypertension Paternal Grandfather    Dementia Maternal Grandmother     ALLERGIES:  is allergic to linezolid and vancomycin.  MEDICATIONS:  Current Outpatient Medications  Medication Sig Dispense Refill   Acetaminophen (TYLENOL EXTRA STRENGTH PO) Take 2 tablets by mouth in the morning and at bedtime.     acyclovir (  ZOVIRAX) 400 MG tablet Take 1 tablet (400 mg total) by mouth 2 (two) times daily. 180 tablet 3   amLODipine (NORVASC) 10 MG tablet Take 10 mg by mouth daily.      apixaban (ELIQUIS) 5 MG TABS tablet TAKE ONE TABLET BY MOUTH TWICE A DAY 60 tablet 6   Ascorbic Acid (VITAMIN C) 100 MG tablet Take 100 mg by mouth daily.     cholecalciferol (VITAMIN D3) 25 MCG (1000 UNIT) tablet Take 1,000 Units by mouth daily.     clobetasol cream (TEMOVATE) 8.59 % Apply 1 Application topically as needed.     Cyanocobalamin (VITAMIN B-12 PO) Take 1 tablet by mouth daily. Not sure the dosage     dexamethasone (DECADRON) 4 MG tablet Take 10 tablets (40 mg total) by mouth once a week. 40 tablet 5   Emollient (UDDERLY SMOOTH) CREA Apply topically daily.     flecainide (TAMBOCOR) 50 MG tablet Take 2 tablets (100 mg total) by mouth 2 (two) times daily.     fluticasone (FLONASE) 50 MCG/ACT nasal spray Place 1 spray into both nostrils daily as needed for allergies or rhinitis.     folic acid (FOLVITE) 1 MG tablet Take 1 mg by mouth daily. (Patient  not taking: Reported on 03/05/2022)     furosemide (LASIX) 40 MG tablet Take 1/2- 1 tablet by mouth as needed     gabapentin (NEURONTIN) 300 MG capsule TAKE TWO CAPSULES BY MOUTH TWICE A DAY 360 capsule 0   hydrALAZINE (APRESOLINE) 10 MG tablet Take 1 tablet (10 mg total) by mouth 2 (two) times daily. AS NEEDED FOR HIGH BP 180 tablet 3   hydrochlorothiazide (HYDRODIURIL) 25 MG tablet TAKE ONE TABLET BY MOUTH ONE TIME DAILY 90 tablet 3   lenalidomide (REVLIMID) 25 MG capsule Take 1 capsule (25 mg total) by mouth daily. Take 21 days on, 7 days off, repeat every 28 days. [employer] auth # [id number] Date obtained: 02/11/22 21 capsule 0   lisinopril (ZESTRIL) 40 MG tablet TAKE ONE TABLET BY MOUTH ONE TIME DAILY 90 tablet 3   metoprolol tartrate (LOPRESSOR) 25 MG tablet Take 25 mg by mouth 2 (two) times daily.     oxymetazoline (AFRIN) 0.05 % nasal spray Place 1 spray into both nostrils 2 (two) times daily as needed for congestion.     potassium chloride SA (KLOR-CON M) 20 MEQ tablet TAKE ONE TABLET BY MOUTH ONE TIME DAILY, TAKE 40 MG (2 TABLETS) ON DAYS THAT YOU ALSO TAKE LASIX (FUROSEMIDE) 135 tablet 3   prochlorperazine (COMPAZINE) 10 MG tablet Take 1 tablet (10 mg total) by mouth every 6 (six) hours as needed for nausea or vomiting. 30 tablet 0   psyllium (METAMUCIL) 58.6 % packet Take 1 packet by mouth as needed.     sodium chloride (OCEAN) 0.65 % SOLN nasal spray Place 1 spray into both nostrils as needed for congestion.     triamcinolone cream (KENALOG) 0.1 % Apply 1 application topically daily as needed (itching).     No current facility-administered medications for this visit.    REVIEW OF SYSTEMS:   Constitutional: ( - ) fevers, ( - )  chills , ( - ) night sweats Eyes: ( - ) blurriness of vision, ( - ) double vision, ( - ) watery eyes Ears, nose, mouth, throat, and face: ( - ) mucositis, ( - ) sore throat Respiratory: ( - ) cough, ( - ) dyspnea, ( - ) wheezes Cardiovascular: ( - ) palpitation,  ( - )  chest discomfort, ( - ) lower extremity swelling Gastrointestinal:  ( - ) nausea, ( - ) heartburn, ( - ) change in bowel habits Skin: ( - ) abnormal skin rashes Lymphatics: ( - ) new lymphadenopathy, ( - ) easy bruising Neurological: ( - ) numbness, ( - ) tingling, ( - ) new weaknesses Behavioral/Psych: ( - ) mood change, ( - ) new changes  All other systems were reviewed with the patient and are negative.  PHYSICAL EXAMINATION: ECOG PERFORMANCE STATUS: 1 - Symptomatic but completely ambulatory  Vitals:   03/08/22 1134  BP: 126/60  Pulse: (!) 51  Resp: 16  Temp: 97.8 F (36.6 C)  SpO2: 96%    Filed Weights   03/08/22 1134  Weight: 249 lb 8 oz (113.2 kg)     GENERAL: Well-appearing elderly Caucasian male, alert, no distress and comfortable SKIN: Rash in a dermatomal distribution that does not cross the midline.  Located at the right upper back.  It is blistering and not draining and mostly crusted. EYES: conjunctiva are pink and non-injected, sclera clear LUNGS: clear to auscultation and percussion with normal breathing effort HEART: regular rate & rhythm and no murmurs and no lower extremity edema Musculoskeletal: no cyanosis of digits and no clubbing  PSYCH: alert & oriented x 3, fluent speech NEURO: no focal motor/sensory deficits  LABORATORY DATA:  I have reviewed the data as listed    Latest Ref Rng & Units 03/08/2022   10:47 AM 02/22/2022    8:25 AM 02/16/2022    2:05 PM  CBC  WBC 4.0 - 10.5 K/uL 7.5  7.1  4.9   Hemoglobin 13.0 - 17.0 g/dL 13.8  13.6  14.1   Hematocrit 39.0 - 52.0 % 39.5  39.4  40.6   Platelets 150 - 400 K/uL 187  196  161        Latest Ref Rng & Units 03/08/2022   10:47 AM 02/22/2022    8:25 AM 02/16/2022    2:05 PM  CMP  Glucose 70 - 99 mg/dL 114  106  186   BUN 8 - 23 mg/dL _0 Creatinine 0.61 - 1.24 mg/dL 0.84  0.91  0.81   Sodium 135 - 145 mmol/L 141  139  140   Potassium 3.5 - 5.1 mmol/L 3.7  3.2  3.7   Chloride 98  - 111 mmol/L 104  103  102   CO2 22 - 32 mmol/L 30  30  33   Calcium 8.9 - 10.3 mg/dL 9.0  8.9  9.0   Total Protein 6.5 - 8.1 g/dL 6.1  5.9  6.4   Total Bilirubin 0.3 - 1.2 mg/dL 0.5  0.5  0.4   Alkaline Phos 38 - 126 U/L 127  119  128   AST 15 - 41 U/L _1 ALT 0 - 44 U/L _2 Lab Results  Component Value Date   MPROTEIN 0.1 (H) 02/22/2022   MPROTEIN 0.1 (H) 02/02/2022   MPROTEIN 0.1 (H) 01/26/2022   Lab Results  Component Value Date   KPAFRELGTCHN 303.4 (H) 02/22/2022   KPAFRELGTCHN 359.5 (H) 02/02/2022   KPAFRELGTCHN 341.6 (H) 01/26/2022   LAMBDASER 3.0 (L) 02/22/2022   LAMBDASER 2.7 (L) 02/02/2022   LAMBDASER 2.8 (L) 01/26/2022   KAPLAMBRATIO 101.13 (H) 02/22/2022   KAPLAMBRATIO 133.15 (H) 02/02/2022   KAPLAMBRATIO 122.00 (H) 01/26/2022     RADIOGRAPHIC  STUDIES: No results found.  ASSESSMENT & PLAN Travis [name] 71 y.o. male with medical history significant for IgA Kappa Multiple Myeloma who presents for a follow up visit.   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  He meets diagnostic criteria by having a serum free light chain ratio greater than 100 and lytic lesions causing pathological fracture of the humerus.  Given this I would recommend we pursued Darzalex, Revlimid, and dexamethasone.  Given his advanced age I would not recommend transplantation, that this is something we can consider when it comes time to transition to maintenance therapy versus transplant.  The patient voices understanding of the plan moving forward.  # IgA Kappa Multiple Myeloma --Metastatic survey completed 12/15/2021 --Patient underwent fixation of his humerus on 12/17/2021, pathology confirmed plasmacytoma/plasma cell neoplasm.  --Cycle 1 Day 1 of  Darzalex, Revlimid, and dexamethasone started on 12/28/2021.  Plan: --labs today show white blood cell 7.5, hemoglobin 13.8,  MCV 93.2, and platelets of 187 --restaging labs today with SPEP and SFLC.  On last check M protein is stable at 0.1 with continued downward trend of kappa light chains. --today is Cycle 3 Day 15 of Dara/Dex/Revlimid --RTC in 2 weeks with interval weekly treatments  # Rash Concerning for Shingles- improving -- Prescribed valacyclovir 3 times daily 1000 mg x 10 days-completed the course -- We will continue to monitor rash closely.  #Left Rib Pain -- X-ray of the ribs show no clear issues with the left ribs. --Pain is steadily improving. --known disease in right ribs -- Continue to monitor  #Supportive Care -- chemotherapy education complete -- port placement not required -- zofran 25m q8H PRN and compazine 160mPO q6H for nausea -- acyclovir 40050mO BID for VCZ prophylaxis -- Zometa/Xgeva treatment to start once dental clearance is obtained.  Discussed this with the patient previously. -- Pain is managed right now with tylenol  Offered stronger pain medication if pain were to worsen.    No orders of the defined types were placed in this encounter.   All questions were answered. The patient knows to call the clinic with any problems, questions or concerns.  A total of more than 30 minutes were spent on this encounter with face-to-face time and non-face-to-face time, including preparing to see the patient, ordering tests and/or medications, counseling the patient and coordination of care as outlined above.   JohLedell PeoplesD Department of Hematology/Oncology [facility] [place]: [phone]: [phone]: [email or web address]  03/08/2022 1:50 PM

## 2022-03-08 NOTE — Progress Notes (Signed)
Patient reports he took '40mg'$  Decadron at home prior to arrival

## 2022-03-09 ENCOUNTER — Ambulatory Visit: Payer: Medicare Other | Admitting: Hematology and Oncology

## 2022-03-09 ENCOUNTER — Other Ambulatory Visit: Payer: Medicare Other

## 2022-03-09 ENCOUNTER — Ambulatory Visit: Payer: Medicare Other

## 2022-03-10 ENCOUNTER — Emergency Department (HOSPITAL_COMMUNITY)
Admission: EM | Admit: 2022-03-10 | Discharge: 2022-03-11 | Disposition: A | Discharge status: Home / Self Care | Payer: Medicare Other | Attending: Emergency Medicine | Admitting: Emergency Medicine

## 2022-03-10 ENCOUNTER — Encounter (HOSPITAL_COMMUNITY): Payer: Self-pay | Admitting: *Deleted

## 2022-03-10 ENCOUNTER — Emergency Department (HOSPITAL_COMMUNITY): Payer: Medicare Other

## 2022-03-10 ENCOUNTER — Other Ambulatory Visit: Payer: Self-pay

## 2022-03-10 ENCOUNTER — Emergency Department (HOSPITAL_COMMUNITY): Admission: EM | Admit: 2022-03-10 | Payer: Medicare Other | Source: Home / Self Care

## 2022-03-10 ENCOUNTER — Telehealth (HOSPITAL_COMMUNITY): Payer: Self-pay

## 2022-03-10 DIAGNOSIS — I4891 Unspecified atrial fibrillation: Secondary | ICD-10-CM | POA: Insufficient documentation

## 2022-03-10 DIAGNOSIS — Z8579 Personal history of other malignant neoplasms of lymphoid, hematopoietic and related tissues: Secondary | ICD-10-CM | POA: Diagnosis not present

## 2022-03-10 DIAGNOSIS — Z7901 Long term (current) use of anticoagulants: Secondary | ICD-10-CM | POA: Diagnosis not present

## 2022-03-10 DIAGNOSIS — J9811 Atelectasis: Secondary | ICD-10-CM | POA: Diagnosis not present

## 2022-03-10 DIAGNOSIS — R079 Chest pain, unspecified: Secondary | ICD-10-CM | POA: Diagnosis not present

## 2022-03-10 DIAGNOSIS — Z79899 Other long term (current) drug therapy: Secondary | ICD-10-CM | POA: Insufficient documentation

## 2022-03-10 DIAGNOSIS — R0789 Other chest pain: Secondary | ICD-10-CM | POA: Diagnosis present

## 2022-03-10 LAB — BASIC METABOLIC PANEL
Anion gap: 11 (ref 5–15)
BUN: 17 mg/dL (ref 8–23)
CO2: 26 mmol/L (ref 22–32)
Calcium: 9.2 mg/dL (ref 8.9–10.3)
Chloride: 101 mmol/L (ref 98–111)
Creatinine, Ser: 0.75 mg/dL (ref 0.61–1.24)
GFR, Estimated: >60 mL/min (ref >60)
Glucose, Bld: 133 mg/dL — ABNORMAL HIGH (ref 70–99)
Potassium: 3 mmol/L — ABNORMAL LOW (ref 3.5–5.1)
Sodium: 138 mmol/L (ref 135–145)

## 2022-03-10 LAB — CBC
HCT: 45.2 % (ref 39.0–52.0)
Hemoglobin: 15.5 g/dL (ref 13.0–17.0)
MCH: 31.6 pg (ref 26.0–34.0)
MCHC: 34.3 g/dL (ref 30.0–36.0)
MCV: 92.1 fL (ref 80.0–100.0)
Platelets: 193 10*3/uL (ref 150–400)
RBC: 4.91 MIL/uL (ref 4.22–5.81)
RDW: 15 % (ref 11.5–15.5)
WBC: 8.3 10*3/uL (ref 4.0–10.5)
nRBC: 0 % (ref 0.0–0.2)

## 2022-03-10 LAB — D-DIMER, QUANTITATIVE: D-Dimer, Quant: 0.52 ug/mL-FEU — ABNORMAL HIGH (ref 0.00–0.50)

## 2022-03-10 LAB — TROPONIN I (HIGH SENSITIVITY): Troponin I (High Sensitivity): 14 ng/L (ref <18)

## 2022-03-10 NOTE — ED Provider Triage Note (Addendum)
Emergency Medicine Provider Triage Evaluation Note  Travis [name] , a 71 y.o. male  was evaluated in triage.  Pt complains of palpitations last night ( HR130). Reports he has been having left sided chest pain that is not reproducible with movement or palpation. Reports some SOB. Recent long travel on plane form [place]. Was hospitalized two days while in [place] because of afib with RVR. Chart visible on care everywhere. Patient is on metoprolol, flecanide, HCTZ, amlodopine, lisinopril. Patient is currently on oral chemo for MM.   Review of Systems  Positive:  Negative:   Physical Exam  BP 134/89   Pulse (!) 122   Temp 97.8 F (36.6 C) (Oral)   Resp 19   Ht '5\' 10"'$  (1.778 m)   Wt 111.1 kg   SpO2 95%   BMI 35.15 kg/m  Gen:   Awake, no distress   Resp:  Normal effort  MSK:   Moves extremities without difficulty  Other:  Tachycardia. No chest wall tenderness.  Medical Decision Making  Medically screening exam initiated at 9:56 PM.  Appropriate orders placed.  [name] was informed that the remainder of the evaluation will be completed by another provider, this initial triage assessment does not replace that evaluation, and the importance of remaining in the ED until their evaluation is complete.     Sherrell Puller, PA-C 03/10/22 2158    Sherrell Puller, PA-C 03/10/22 2159

## 2022-03-10 NOTE — Telephone Encounter (Signed)
Patient states he has been in A-fib since last night. He is having pain under the lower part of his armpit. He has taken Tylenol for the pain. He mentioned he may have possibly pulled a muscle. Per Roderic Palau NP she instructed patient to increase his Metoprolol Tartrate 25 mg to 1 tablet 3 times daily until he converts as long as his heart rate and blood pressure tolerated the increased dose.  He was told to contact [facility]-fib Clinic back tomorrow to let us know how he is feeling. If his pain under his armpit continues to concern him and the pain is not subsiding he was told to contact his pcp or he can go the ER to be assessed. Communicated with patient and he verbalized understanding.

## 2022-03-10 NOTE — ED Notes (Signed)
Pt refusing to go back to waiting room.

## 2022-03-10 NOTE — ED Triage Notes (Signed)
Pt says that around 2000 last night he felt like his heart rate started to go up (hx of afib). He c/o soreness in the left chest. On eliquis. MM patient, last chemo infusion was Monday, is on oral chemo as well.

## 2022-03-11 LAB — TROPONIN I (HIGH SENSITIVITY): Troponin I (High Sensitivity): 14 ng/L (ref <18)

## 2022-03-11 MED ORDER — METOPROLOL TARTRATE 5 MG/5ML IV SOLN
5.0000 mg | Freq: Once | INTRAVENOUS | Status: AC
  Administered 2022-03-11: 5 mg via INTRAVENOUS
  Filled 2022-03-11: qty 5

## 2022-03-11 MED ORDER — METOPROLOL TARTRATE 25 MG PO TABS
50.0000 mg | ORAL_TABLET | Freq: Once | ORAL | Status: AC
  Administered 2022-03-11: 50 mg via ORAL
  Filled 2022-03-11: qty 2

## 2022-03-11 NOTE — ED Provider Notes (Signed)
[facility] DEPT Provider Note   CSN: [id number] Arrival date & time: 03/10/22  2130     History  Chief Complaint  Patient presents with   Chest Pain    Travis [name] is a 71 y.o. male.  Patient presents to the emergency department for evaluation of possible atrial fibrillation.  Patient reports that he had onset of left-sided chest discomfort around 8 PM.  He reports that this is how he feels typically when he goes into A-fib.  He has had 2 episodes of atrial fibrillation previously.  He has been treated medically both times with conversion to sinus rhythm.  Patient being treated for multiple myeloma, reports that the atrial fibrillation started after his first dose of chemotherapy for myeloma.  Patient is on Eliquis, has not missed any doses.       Home Medications Prior to Admission medications   Medication Sig Start Date End Date Taking? Authorizing Provider  acetaminophen (TYLENOL) 500 MG tablet Take 500 mg by mouth every 6 (six) hours as needed for moderate pain.   Yes [provider]  acyclovir (ZOVIRAX) 400 MG tablet Take 1 tablet (400 mg total) by mouth 2 (two) times daily. 12/10/21  Yes Orson Slick, MD  amLODipine (NORVASC) 10 MG tablet Take 10 mg by mouth daily.    Yes [provider]  apixaban (ELIQUIS) 5 MG TABS tablet TAKE ONE TABLET BY MOUTH TWICE A DAY 08/12/21  Yes Fay Records, MD  Ascorbic Acid (VITAMIN C) 100 MG tablet Take 100 mg by mouth daily.   Yes [provider]  cholecalciferol (VITAMIN D3) 25 MCG (1000 UNIT) tablet Take 1,000 Units by mouth daily.   Yes [provider]  clobetasol cream (TEMOVATE) 5.63 % Apply 1 Application topically daily as needed (rash). 08/05/21  Yes [provider]  Cyanocobalamin (VITAMIN B-12 PO) Take 1 tablet by mouth daily. Not sure the dosage   Yes [provider]  dexamethasone (DECADRON) 4 MG tablet Take 10 tablets (40 mg total) by mouth  once a week. 12/10/21  Yes Orson Slick, MD  flecainide (TAMBOCOR) 50 MG tablet Take 2 tablets (100 mg total) by mouth 2 (two) times daily. 03/05/22  Yes Sherran Needs, NP  folic acid (FOLVITE) 1 MG tablet Take 1 mg by mouth daily. 11/12/19  Yes [provider]  furosemide (LASIX) 40 MG tablet Take 40 mg by mouth daily as needed for fluid or edema. 11/16/21  Yes [provider]  gabapentin (NEURONTIN) 300 MG capsule TAKE TWO CAPSULES BY MOUTH TWICE A DAY 12/04/21  Yes Patel, Donika K, DO  hydrALAZINE (APRESOLINE) 10 MG tablet Take 1 tablet (10 mg total) by mouth 2 (two) times daily. AS NEEDED FOR HIGH BP 12/07/21  Yes Fay Records, MD  hydrochlorothiazide (HYDRODIURIL) 25 MG tablet TAKE ONE TABLET BY MOUTH ONE TIME DAILY 08/12/21  Yes Fay Records, MD  lenalidomide (REVLIMID) 25 MG capsule Take 1 capsule (25 mg total) by mouth daily. Take 21 days on, 7 days off, repeat every 28 days. [employer] auth # [id number] Date obtained: 02/11/22 02/11/22  Yes Orson Slick, MD  lisinopril (ZESTRIL) 40 MG tablet TAKE ONE TABLET BY MOUTH ONE TIME DAILY 08/12/21  Yes Fay Records, MD  metoprolol tartrate (LOPRESSOR) 25 MG tablet Take 25 mg by mouth in the morning, at noon, and at bedtime. 03/01/22 03/31/22 Yes [provider]  oxymetazoline (AFRIN) 0.05 % nasal spray  Place 1 spray into both nostrils 2 (two) times daily as needed for congestion.   Yes [provider]  potassium chloride SA (KLOR-CON M) 20 MEQ tablet TAKE ONE TABLET BY MOUTH ONE TIME DAILY, TAKE 40 MG (2 TABLETS) ON DAYS THAT YOU ALSO TAKE LASIX (FUROSEMIDE) 10/28/21  Yes Fay Records, MD  prochlorperazine (COMPAZINE) 10 MG tablet Take 1 tablet (10 mg total) by mouth every 6 (six) hours as needed for nausea or vomiting. 12/10/21  Yes Orson Slick, MD  psyllium (METAMUCIL) 58.6 % packet Take 1 packet by mouth daily as needed (constipation).   Yes [provider]  sodium chloride (OCEAN) 0.65 % SOLN  nasal spray Place 1 spray into both nostrils as needed for congestion.   Yes [provider]  triamcinolone cream (KENALOG) 0.1 % Apply 1 application topically daily as needed (itching). 06/05/19  Yes [provider]  fluticasone (FLONASE) 50 MCG/ACT nasal spray Place 1 spray into both nostrils daily as needed for allergies or rhinitis.    [provider]      Allergies    Linezolid and Vancomycin    Review of Systems   Review of Systems  Physical Exam Updated Vital Signs BP 129/85   Pulse (!) 52   Temp (!) 97.4 F (36.3 C) (Oral)   Resp 13   Ht _0  (1.778 m)   Wt 111.1 kg   SpO2 93%   BMI 35.15 kg/m  Physical Exam Vitals and nursing note reviewed.  Constitutional:      General: He is not in acute distress.    Appearance: He is well-developed.  HENT:     Head: Normocephalic and atraumatic.     Mouth/Throat:     Mouth: Mucous membranes are moist.  Eyes:     General: Vision grossly intact. Gaze aligned appropriately.     Extraocular Movements: Extraocular movements intact.     Conjunctiva/sclera: Conjunctivae normal.  Cardiovascular:     Rate and Rhythm: Regular rhythm. Tachycardia present.     Pulses: Normal pulses.     Heart sounds: Normal heart sounds, S1 normal and S2 normal. No murmur heard.    No friction rub. No gallop.  Pulmonary:     Effort: Pulmonary effort is normal. No respiratory distress.     Breath sounds: Normal breath sounds.  Abdominal:     Palpations: Abdomen is soft.     Tenderness: There is no abdominal tenderness. There is no guarding or rebound.     Hernia: No hernia is present.  Musculoskeletal:        General: No swelling.     Cervical back: Full passive range of motion without pain, normal range of motion and neck supple. No pain with movement, spinous process tenderness or muscular tenderness. Normal range of motion.     Right lower leg: No edema.     Left lower leg: No edema.  Skin:    General: Skin is warm  and dry.     Capillary Refill: Capillary refill takes less than 2 seconds.     Findings: No ecchymosis, erythema, lesion or wound.  Neurological:     Mental Status: He is alert and oriented to person, place, and time.     GCS: GCS eye subscore is 4. GCS verbal subscore is 5. GCS motor subscore is 6.     Cranial Nerves: Cranial nerves 2-12 are intact.     Sensory: Sensation is intact.     Motor: Motor  function is intact. No weakness or abnormal muscle tone.     Coordination: Coordination is intact.  Psychiatric:        Mood and Affect: Mood normal.        Speech: Speech normal.        Behavior: Behavior normal.     ED Results / Procedures / Treatments   Labs (all labs ordered are listed, but only abnormal results are displayed) Labs Reviewed  BASIC METABOLIC PANEL - Abnormal; Notable for the following components:      Result Value   Potassium 3.0 (*)    Glucose, Bld 133 (*)    All other components within normal limits  D-DIMER, QUANTITATIVE - Abnormal; Notable for the following components:   D-Dimer, Quant 0.52 (*)    All other components within normal limits  CBC  TROPONIN I (HIGH SENSITIVITY)  TROPONIN I (HIGH SENSITIVITY)    EKG None  ED ECG REPORT   Date: 03/11/2022  Rate: 119  Rhythm:  etopic atrial tachycardia vs AFlutter  QRS Axis: right  Intervals: QT prolonged  ST/T Wave abnormalities: normal  Conduction Disutrbances:none  Narrative Interpretation:   Old EKG Reviewed: changes noted  I have personally reviewed the EKG tracing and agree with the computerized printout as noted.  Radiology DG Chest 2 View  Result Date: 03/10/2022 CLINICAL DATA:  Chest pain, atrial fibrillation. EXAM: CHEST - 2 VIEW COMPARISON:  None Available. FINDINGS: The heart size and mediastinal contours are within normal limits. Left basilar subsegmental linear atelectasis. Lungs otherwise clear. Partially imaged hardware in the right humerus thoracic spondylosis. IMPRESSION: Left  basilar subsegmental linear atelectasis. Lungs otherwise clear. Electronically Signed   By: Keane Police D.O.   On: 03/10/2022 22:36    Procedures Procedures    Medications Ordered in ED Medications  metoprolol tartrate (LOPRESSOR) injection 5 mg (5 mg Intravenous Given 03/11/22 0223)  metoprolol tartrate (LOPRESSOR) injection 5 mg (5 mg Intravenous Given 03/11/22 0301)  metoprolol tartrate (LOPRESSOR) injection 5 mg (5 mg Intravenous Given 03/11/22 0409)  metoprolol tartrate (LOPRESSOR) injection 5 mg (5 mg Intravenous Given 03/11/22 0602)    ED Course/ Medical Decision Making/ A&P                           Medical Decision Making Risk Prescription drug management.   Patient presents to the emergency department with complaints of left-sided chest pain and rapid heart rate.  Patient reports a history of atrial fibrillation and has had the symptoms previously.  Patient reports that he was first diagnosed after he started chemotherapy 2 months ago.  He reports that he was in [place] a week ago and had a repeat episode.  Differential diagnosis considered includes NSTEMI, unstable angina, atrial fibrillation with rapid ventricular response, other atrial tachycardia.  At presentation, patient's heart rate was found to be 120.  This was a narrow complex regular tachycardia.  It was difficult to tell if this was an ectopic atrial tachycardia or slow a flutter.  Reviewing records reveals that he has had atrial flutter with his atrial fibrillation previously.  He was told to take an additional Lopressor, so presumably his heart rate could be slower than predicted 150.  Troponins have been cycled and are negative.  He always has this left-sided chest pain when he has a rapid heart rate.  Tells me he has not had any type of heart catheterization or stress testing.  Records from his hospitalization in  [place].  He was admitted with atrial fibrillation with a rapid ventricular response.  He was given  additional Lopressor with rate control.  An echo was performed which was entirely normal.  His final diagnosis was A-fib with rapid ventricular response, now rate controlled.  He was under the impression that he was in sinus rhythm when he left the hospital but I suspect that he was discharged in atrial fibrillation.  His Lopressor dosing was increased from twice daily to 3 times daily at time of discharge and he was to follow-up with his cardiologist.  He did, however, follow-up with cardiology on November 3 and at that time he was in sinus rhythm.  Patient given doses of IV Lopressor here in the department.  He is now rate controlled in atrial flutter with variable block.  Discussed with Dr. Harrell Gave, on-call for cardiology.  She recommends increasing Lopressor to 50 mg twice daily.  Patient can be discharged and A-fib clinic will contact him for follow-up.  He can be dual for outpatient DC cardioversion next week if he remains in A-fib.  This was discussed extensively with the patient he is comfortable with the plan.  He understands the plan.  He also was given return precautions.  CRITICAL CARE Performed by: Orpah Greek   Total critical care time: 35 minutes  Critical care time was exclusive of separately billable procedures and treating other patients.  Critical care was necessary to treat or prevent imminent or life-threatening deterioration.  Critical care was time spent personally by me on the following activities: development of treatment plan with patient and/or surrogate as well as nursing, discussions with consultants, evaluation of patient's response to treatment, examination of patient, obtaining history from patient or surrogate, ordering and performing treatments and interventions, ordering and review of laboratory studies, ordering and review of radiographic studies, pulse oximetry and re-evaluation of patient's condition.         Final Clinical Impression(s)  / ED Diagnoses Final diagnoses:  Atrial fibrillation with [facility])    Rx / DC Orders ED Discharge Orders     None         Haleemah Buckalew, Gwenyth Allegra, MD 03/11/22 [phone]

## 2022-03-11 NOTE — Discharge Instructions (Addendum)
Increase the dose of your Lopressor to 50 mg twice a day.  For now you can take 2 of your 25 mg tablets in the morning and then 2 at night. We have given you this morning's dose here in the emergency department.  You should hear from the atrial fibrillation clinic about follow-up.  If you do not hear by tomorrow, call them.  If you develop worsening chest pain, shortness of breath, dizziness or passing out, return to the ER or call 911.

## 2022-03-12 ENCOUNTER — Other Ambulatory Visit: Payer: Self-pay | Admitting: *Deleted

## 2022-03-12 ENCOUNTER — Telehealth (HOSPITAL_COMMUNITY): Payer: Self-pay

## 2022-03-12 ENCOUNTER — Encounter (HOSPITAL_COMMUNITY): Payer: Self-pay

## 2022-03-12 DIAGNOSIS — C9000 Multiple myeloma not having achieved remission: Secondary | ICD-10-CM

## 2022-03-12 MED ORDER — LENALIDOMIDE 25 MG PO CAPS: 25.0000 mg | ORAL_CAPSULE | Freq: Every day | ORAL | 0 refills | Status: DC

## 2022-03-12 NOTE — Telephone Encounter (Signed)
Contacted patient regarding his A-fib. His heart rate 103, B/P 114/74. He has been in A-fib for three days. Patient was seen in the ER last night and Dr. Harrell Gave instructed patient to increase his Lopressor to '50mg'$  twice daily until seen at the A-fib clinic. Discussed with Tilda Franco and he agreed with her plan as well. He is scheduled to see Roderic Palau NP at the [facility] on 11/15 @ 3:30pm. Patient advised to contact our clinic back if he has further concerns. Communicated with patient and he verbalized understanding.

## 2022-03-15 ENCOUNTER — Emergency Department (HOSPITAL_COMMUNITY): Payer: Medicare Other

## 2022-03-15 ENCOUNTER — Telehealth: Payer: Self-pay | Admitting: Internal Medicine

## 2022-03-15 ENCOUNTER — Emergency Department (HOSPITAL_COMMUNITY)
Admission: EM | Admit: 2022-03-15 | Discharge: 2022-03-15 | Disposition: A | Discharge status: Home / Self Care | Payer: Medicare Other | Attending: Emergency Medicine | Admitting: Emergency Medicine

## 2022-03-15 ENCOUNTER — Encounter (HOSPITAL_COMMUNITY): Payer: Self-pay

## 2022-03-15 DIAGNOSIS — C9000 Multiple myeloma not having achieved remission: Secondary | ICD-10-CM | POA: Insufficient documentation

## 2022-03-15 DIAGNOSIS — R0602 Shortness of breath: Secondary | ICD-10-CM | POA: Diagnosis not present

## 2022-03-15 DIAGNOSIS — Z7901 Long term (current) use of anticoagulants: Secondary | ICD-10-CM | POA: Insufficient documentation

## 2022-03-15 DIAGNOSIS — D696 Thrombocytopenia, unspecified: Secondary | ICD-10-CM | POA: Insufficient documentation

## 2022-03-15 DIAGNOSIS — I10 Essential (primary) hypertension: Secondary | ICD-10-CM | POA: Diagnosis not present

## 2022-03-15 DIAGNOSIS — Z79899 Other long term (current) drug therapy: Secondary | ICD-10-CM | POA: Insufficient documentation

## 2022-03-15 DIAGNOSIS — E876 Hypokalemia: Secondary | ICD-10-CM | POA: Insufficient documentation

## 2022-03-15 DIAGNOSIS — Z96641 Presence of right artificial hip joint: Secondary | ICD-10-CM | POA: Insufficient documentation

## 2022-03-15 DIAGNOSIS — R0789 Other chest pain: Secondary | ICD-10-CM | POA: Insufficient documentation

## 2022-03-15 DIAGNOSIS — R001 Bradycardia, unspecified: Secondary | ICD-10-CM | POA: Diagnosis not present

## 2022-03-15 LAB — CBC WITH DIFFERENTIAL/PLATELET
Abs Immature Granulocytes: 0.02 10*3/uL (ref 0.00–0.07)
Basophils Absolute: 0 10*3/uL (ref 0.0–0.1)
Basophils Relative: 1 %
Eosinophils Absolute: 0.5 10*3/uL (ref 0.0–0.5)
Eosinophils Relative: 11 %
HCT: 39.8 % (ref 39.0–52.0)
Hemoglobin: 13.5 g/dL (ref 13.0–17.0)
Immature Granulocytes: 0 %
Lymphocytes Relative: 12 %
Lymphs Abs: 0.6 10*3/uL — ABNORMAL LOW (ref 0.7–4.0)
MCH: 32 pg (ref 26.0–34.0)
MCHC: 33.9 g/dL (ref 30.0–36.0)
MCV: 94.3 fL (ref 80.0–100.0)
Monocytes Absolute: 1 10*3/uL (ref 0.1–1.0)
Monocytes Relative: 20 %
Neutro Abs: 2.8 10*3/uL (ref 1.7–7.7)
Neutrophils Relative %: 56 %
Platelets: 128 10*3/uL — ABNORMAL LOW (ref 150–400)
RBC: 4.22 MIL/uL (ref 4.22–5.81)
RDW: 15 % (ref 11.5–15.5)
WBC: 4.9 10*3/uL (ref 4.0–10.5)
nRBC: 0 % (ref 0.0–0.2)

## 2022-03-15 LAB — COMPREHENSIVE METABOLIC PANEL
ALT: 27 U/L (ref 0–44)
AST: 18 U/L (ref 15–41)
Albumin: 3.7 g/dL (ref 3.5–5.0)
Alkaline Phosphatase: 107 U/L (ref 38–126)
Anion gap: 8 (ref 5–15)
BUN: 12 mg/dL (ref 8–23)
CO2: 28 mmol/L (ref 22–32)
Calcium: 9 mg/dL (ref 8.9–10.3)
Chloride: 104 mmol/L (ref 98–111)
Creatinine, Ser: 0.72 mg/dL (ref 0.61–1.24)
GFR, Estimated: >60 mL/min (ref >60)
Glucose, Bld: 90 mg/dL (ref 70–99)
Potassium: 3.1 mmol/L — ABNORMAL LOW (ref 3.5–5.1)
Sodium: 140 mmol/L (ref 135–145)
Total Bilirubin: 0.7 mg/dL (ref 0.3–1.2)
Total Protein: 5.8 g/dL — ABNORMAL LOW (ref 6.5–8.1)

## 2022-03-15 LAB — TROPONIN I (HIGH SENSITIVITY): Troponin I (High Sensitivity): 14 ng/L (ref <18)

## 2022-03-15 MED ORDER — LIDOCAINE 5 % EX PTCH: 1.0000 | MEDICATED_PATCH | Freq: Every day | CUTANEOUS | 0 refills | Status: DC | PRN

## 2022-03-15 MED ORDER — ACETAMINOPHEN 325 MG PO TABS
650.0000 mg | ORAL_TABLET | Freq: Once | ORAL | Status: AC
  Administered 2022-03-15: 650 mg via ORAL
  Filled 2022-03-15: qty 2

## 2022-03-15 MED ORDER — METHOCARBAMOL 500 MG PO TABS: 500.0000 mg | ORAL_TABLET | Freq: Two times a day (BID) | ORAL | 0 refills | Status: DC

## 2022-03-15 MED ORDER — ACETAMINOPHEN 325 MG PO TABS: 650.0000 mg | ORAL_TABLET | Freq: Four times a day (QID) | ORAL | 0 refills | Status: DC | PRN

## 2022-03-15 MED ORDER — MAGNESIUM OXIDE -MG SUPPLEMENT 400 (240 MG) MG PO TABS
400.0000 mg | ORAL_TABLET | Freq: Once | ORAL | Status: AC
  Administered 2022-03-15: 400 mg via ORAL
  Filled 2022-03-15: qty 1

## 2022-03-15 MED ORDER — POTASSIUM CHLORIDE CRYS ER 20 MEQ PO TBCR
40.0000 meq | EXTENDED_RELEASE_TABLET | Freq: Once | ORAL | Status: AC
  Administered 2022-03-15: 40 meq via ORAL
  Filled 2022-03-15: qty 2

## 2022-03-15 MED ORDER — LIDOCAINE 5 % EX PTCH
1.0000 | MEDICATED_PATCH | CUTANEOUS | Status: DC
  Administered 2022-03-15: 1 via TRANSDERMAL
  Filled 2022-03-15: qty 1

## 2022-03-15 MED ORDER — IOHEXOL 350 MG/ML SOLN
75.0000 mL | Freq: Once | INTRAVENOUS | Status: AC | PRN
  Administered 2022-03-15: 75 mL via INTRAVENOUS

## 2022-03-15 MED ORDER — METHOCARBAMOL 500 MG PO TABS: 500.0000 mg | ORAL_TABLET | Freq: Two times a day (BID) | ORAL | 0 refills | Status: AC

## 2022-03-15 NOTE — ED Provider Triage Note (Signed)
Emergency Medicine Provider Triage Evaluation Note  Travis [name] , a 71 y.o. male  was evaluated in triage.  Pt complains of left sided chest pain for the past few weeks. He reports it usually is present with his afib, but he has been out of it for the past few days and still experiencing pain. Was told by hius cardiologist to come in.  Review of Systems  Positive:  Negative:   Physical Exam  BP 133/64   Pulse (!) 48   Temp 98.3 F (36.8 C) (Oral)   Resp 17   SpO2 97%  Gen:   Awake, no distress   Resp:  Normal effort  MSK:   Moves extremities without difficulty  Other:  Very mild tenderness to palpation  Medical Decision Making  Medically screening exam initiated at 12:13 PM.  Appropriate orders placed.  Travis [name] was informed that the remainder of the evaluation will be completed by another provider, this initial triage assessment does not replace that evaluation, and the importance of remaining in the ED until their evaluation is complete.  Spoke with my attending. Will order CTA given afib and Cancer history.    Travis Puller, PA-C 03/15/22 1215

## 2022-03-15 NOTE — ED Provider Notes (Addendum)
[facility] DEPT Provider Note   CSN: [id number] Arrival date & time: 03/15/22  1039     History  Chief Complaint  Patient presents with   Flank Pain    Travis [name] is a 71 y.o. male.  Patient as above with significant medical history as below, including A-fib on Eliquis/flecainide/lopressor, multiple myeloma who presents to the ED with complaint of left-sided chest pain.  Patient reports he was recently hospitalized when he was out of the area in [place] secondary to A-fib, that time he was having similar left-sided chest pain.  Aching, pressure sensation in the left axilla to left shoulder and left lower chest wall.  Symptoms resolved when he was discharged and began again couple days ago.  Somewhat worsened with deep inspiration and positional changes.  No dyspnea.  No nausea or vomiting, no lightheadedness or diaphoresis, no syncope.  Took Tylenol yesterday which did improve his symptoms.  No Rashes, no trauma.  Compliant with home medications.  Denies any numbness or tingling.  Does have reduced function of his right upper extremity secondary to prior fracture and has been using his left upper extremity more frequently for ADLs.  Called cardiology office earlier today who recommended come to the ED for evaluation.     Past Medical History:  Diagnosis Date   Arthritis    "comes and goes; mostly in hands, occasionally elbow" (38/10/5641)   Complication of anesthesia    "just wanted to sleep alot  for couple days S/P VARICOCELE OR" (04/05/2017)   DVT (deep venous thrombosis) (HCC)    LLE   Dysrhythmia    PVCs    GERD (gastroesophageal reflux disease)    History of hiatal hernia    Hypertension    Impaired glucose tolerance    Multiple myeloma (HCC)    Obesity (BMI 30-39.9) 07/26/2016   OSA on CPAP 07/26/2016   Mild with AHI 12.5/hr now on CPAP at 14cm H2O   PAF (paroxysmal atrial fibrillation) ([place])    s/p PVI Ablation in 2018 // Flecainide  Rx // Echo 8/21: EF 60-65, no RWMA, mild LVH, normal RV SF, moderate RVE, mild BAE, trivial MR, mild dilation of aortic root (40 mm)   Pneumonia    "may have had walking pneumonia a few years ago" (04/05/2017)   Pre-diabetes    Thrombophlebitis     Past Surgical History:  Procedure Laterality Date   ATRIAL FIBRILLATION ABLATION  04/05/2017   ATRIAL FIBRILLATION ABLATION N/A 04/05/2017   Procedure: ATRIAL FIBRILLATION ABLATION;  Surgeon: Thompson Grayer, MD;  Location: [facility];  Service: Cardiovascular;  Laterality: N/A;   BONE BIOPSY Right 12/17/2021   Procedure: BONE BIOPSY;  Surgeon: Hiram Gash, MD;  Location: [facility];  Service: Orthopedics;  Laterality: Right;   COMPLETE RIGHT HIP REPLACMENT  01/19/2019   EVALUATION UNDER ANESTHESIA WITH HEMORRHOIDECTOMY N/A 02/24/2017   Procedure: EXAM UNDER ANESTHESIA WITH  HEMORRHOIDECTOMY;  Surgeon: Michael Boston, MD;  Location: [facility];  Service: General;  Laterality: N/A;   EXCISIONAL TOTAL HIP ARTHROPLASTY WITH ANTIBIOTIC SPACERS Right 09/25/2020   Procedure: EXCISIONAL TOTAL HIP ARTHROPLASTY WITH ANTIBIOTIC SPACERS;  Surgeon: Paralee Cancel, MD;  Location: [facility];  Service: Orthopedics;  Laterality: Right;   FLEXIBLE SIGMOIDOSCOPY N/A 04/15/2015   Procedure:  UNSEDATED FLEXIBLE SIGMOIDOSCOPY;  Surgeon: [name], MD;  Location: [facility];  Service: Endoscopy;  Laterality: N/A;   HUMERUS IM NAIL Right 12/17/2021   Procedure: INTRAMEDULLARY (IM) NAIL  HUMERAL;  Surgeon: Hiram Gash, MD;  Location: [facility];  Service: Orthopedics;  Laterality: Right;   KNEE ARTHROSCOPY Left ~ 2016   REIMPLANTATION OF TOTAL HIP Right 12/25/2020   Procedure: REIMPLANTATION/REVISION OF RIGHT TOTAL HIP VERSUS REPEAT IRRIGATION AND DEBRIDEMENT;  Surgeon: Paralee Cancel, MD;  Location: [facility];  Service: Orthopedics;  Laterality: Right;   TESTICLE SURGERY  1988   VARICOCELE   TONSILLECTOMY     VARICOSE VEIN SURGERY  Bilateral      The history is provided by the patient. No language interpreter was used.  Flank Pain Associated symptoms include chest pain. Pertinent negatives include no abdominal pain, no headaches and no shortness of breath.       Home Medications Prior to Admission medications   Medication Sig Start Date End Date Taking? Authorizing Provider  acetaminophen (TYLENOL) 500 MG tablet Take 500 mg by mouth every 6 (six) hours as needed for moderate pain.    [provider]  acyclovir (ZOVIRAX) 400 MG tablet Take 1 tablet (400 mg total) by mouth 2 (two) times daily. 12/10/21   Orson Slick, MD  amLODipine (NORVASC) 10 MG tablet Take 10 mg by mouth daily.     [provider]  apixaban (ELIQUIS) 5 MG TABS tablet TAKE ONE TABLET BY MOUTH TWICE A DAY 08/12/21   Fay Records, MD  Ascorbic Acid (VITAMIN C) 100 MG tablet Take 100 mg by mouth daily.    [provider]  cholecalciferol (VITAMIN D3) 25 MCG (1000 UNIT) tablet Take 1,000 Units by mouth daily.    [provider]  clobetasol cream (TEMOVATE) 0.98 % Apply 1 Application topically daily as needed (rash). 08/05/21   [provider]  Cyanocobalamin (VITAMIN B-12 PO) Take 1 tablet by mouth daily. Not sure the dosage    [provider]  dexamethasone (DECADRON) 4 MG tablet Take 10 tablets (40 mg total) by mouth once a week. 12/10/21   Orson Slick, MD  flecainide (TAMBOCOR) 50 MG tablet Take 2 tablets (100 mg total) by mouth 2 (two) times daily. 03/05/22   Sherran Needs, NP  fluticasone (FLONASE) 50 MCG/ACT nasal spray Place 1 spray into both nostrils daily as needed for allergies or rhinitis.    [provider]  folic acid (FOLVITE) 1 MG tablet Take 1 mg by mouth daily. 11/12/19   [provider]  furosemide (LASIX) 40 MG tablet Take 40 mg by mouth daily as needed for fluid or edema. 11/16/21   [provider]  gabapentin (NEURONTIN) 300 MG capsule TAKE TWO  CAPSULES BY MOUTH TWICE A DAY 12/04/21   Patel, Donika K, DO  hydrALAZINE (APRESOLINE) 10 MG tablet Take 1 tablet (10 mg total) by mouth 2 (two) times daily. AS NEEDED FOR HIGH BP 12/07/21   Fay Records, MD  hydrochlorothiazide (HYDRODIURIL) 25 MG tablet TAKE ONE TABLET BY MOUTH ONE TIME DAILY 08/12/21   Fay Records, MD  lenalidomide (REVLIMID) 25 MG capsule Take 1 capsule (25 mg total) by mouth daily. Take 21 days on, 7 days off, repeat every 28 days. [employer] Josem Kaufmann #[id number] Date obtained: 03/12/22 03/12/22   Orson Slick, MD  lisinopril (ZESTRIL) 40 MG tablet TAKE ONE TABLET BY MOUTH ONE TIME DAILY 08/12/21   Fay Records, MD  metoprolol tartrate (LOPRESSOR) 25 MG tablet Take 25 mg by mouth in the morning, at noon, and at bedtime. 03/01/22 03/31/22  [provider]  oxymetazoline (  AFRIN) 0.05 % nasal spray Place 1 spray into both nostrils 2 (two) times daily as needed for congestion.    [provider]  potassium chloride SA (KLOR-CON M) 20 MEQ tablet TAKE ONE TABLET BY MOUTH ONE TIME DAILY, TAKE 40 MG (2 TABLETS) ON DAYS THAT YOU ALSO TAKE LASIX (FUROSEMIDE) 10/28/21   Fay Records, MD  prochlorperazine (COMPAZINE) 10 MG tablet Take 1 tablet (10 mg total) by mouth every 6 (six) hours as needed for nausea or vomiting. 12/10/21   Orson Slick, MD  psyllium (METAMUCIL) 58.6 % packet Take 1 packet by mouth daily as needed (constipation).    [provider]  sodium chloride (OCEAN) 0.65 % SOLN nasal spray Place 1 spray into both nostrils as needed for congestion.    [provider]  triamcinolone cream (KENALOG) 0.1 % Apply 1 application topically daily as needed (itching). 06/05/19   [provider]      Allergies    Linezolid and Vancomycin    Review of Systems   Review of Systems  Constitutional:  Negative for chills and fever.  HENT:  Negative for facial swelling and trouble swallowing.   Eyes:  Negative for photophobia and visual disturbance.   Respiratory:  Negative for cough and shortness of breath.   Cardiovascular:  Positive for chest pain. Negative for palpitations.  Gastrointestinal:  Negative for abdominal pain, nausea and vomiting.  Endocrine: Negative for polydipsia and polyuria.  Genitourinary:  Negative for difficulty urinating and hematuria.  Musculoskeletal:  Positive for arthralgias. Negative for gait problem and joint swelling.  Skin:  Negative for pallor and rash.  Neurological:  Negative for syncope and headaches.  Psychiatric/Behavioral:  Negative for agitation and confusion.     Physical Exam Updated Vital Signs BP (!) 162/78   Pulse (!) 52   Temp 98.1 F (36.7 C)   Resp 18   SpO2 99%  Physical Exam Vitals and nursing note reviewed.  Constitutional:      General: He is not in acute distress.    Appearance: Normal appearance. He is well-developed.  HENT:     Head: Normocephalic and atraumatic.     Right Ear: External ear normal.     Left Ear: External ear normal.     Mouth/Throat:     Mouth: Mucous membranes are moist.  Eyes:     General: No scleral icterus. Cardiovascular:     Rate and Rhythm: Regular rhythm. Bradycardia present.     Pulses: Normal pulses.     Heart sounds: Normal heart sounds.  Pulmonary:     Effort: Pulmonary effort is normal. No respiratory distress.     Breath sounds: Normal breath sounds.  Chest:     Comments: No rashes to left axilla, no external evidence of trauma to left axilla. Abdominal:     General: Abdomen is flat.     Palpations: Abdomen is soft.     Tenderness: There is no abdominal tenderness.  Musculoskeletal:        General: Normal range of motion.       Arms:     Cervical back: Normal range of motion.     Right lower leg: No edema.     Left lower leg: No edema.     Comments: Mild ttp   Skin:    General: Skin is warm and dry.     Capillary Refill: Capillary refill takes less than 2 seconds.  Neurological:     Mental Status: He is  alert and  oriented to person, place, and time.     GCS: GCS eye subscore is 4. GCS verbal subscore is 5. GCS motor subscore is 6.  Psychiatric:        Mood and Affect: Mood normal.        Behavior: Behavior normal.     ED Results / Procedures / Treatments   Labs (all labs ordered are listed, but only abnormal results are displayed) Labs Reviewed  CBC WITH DIFFERENTIAL/PLATELET - Abnormal; Notable for the following components:      Result Value   Platelets 128 (*)    Lymphs Abs 0.6 (*)    All other components within normal limits  COMPREHENSIVE METABOLIC PANEL - Abnormal; Notable for the following components:   Potassium 3.1 (*)    Total Protein 5.8 (*)    All other components within normal limits  TROPONIN I (HIGH SENSITIVITY)    EKG EKG Interpretation  Date/Time:  Monday March 15 2022 12:23:03 EST Ventricular Rate:  47 PR Interval:  193 QRS Duration: 100 QT Interval:  518 QTC Calculation: 458 R Axis:   19 Text Interpretation: Sinus bradycardia similar 03/05/22 Confirmed by Wynona Dove ([id number]) on 03/15/2022 5:10:46 PM  Radiology CT Angio Chest PE W and/or Wo Contrast  Result Date: 03/15/2022 CLINICAL DATA:  Short of breath, concern for pulmonary embolism Multiple myeloma. EXAM: CT ANGIOGRAPHY CHEST WITH CONTRAST TECHNIQUE: Multidetector CT imaging of the chest was performed using the standard protocol during bolus administration of intravenous contrast. Multiplanar CT image reconstructions and MIPs were obtained to evaluate the vascular anatomy. RADIATION DOSE REDUCTION: This exam was performed according to the departmental dose-optimization program which includes automated exposure control, adjustment of the mA and/or kV according to patient size and/or use of iterative reconstruction technique. CONTRAST:  80m OMNIPAQUE IOHEXOL 350 MG/ML SOLN COMPARISON:  None Available. FINDINGS: Cardiovascular: No filling defects within the pulmonary arteries to suggest acute pulmonary embolism.  Mediastinum/Nodes: No axillary or supraclavicular adenopathy. No mediastinal or hilar adenopathy. No pericardial fluid. Esophagus normal. Lungs/Pleura: No pulmonary infarction. No pneumonia. No pleural fluid. No pneumothorax Upper Abdomen: Limited view of the liver, kidneys, pancreas are unremarkable. Normal adrenal glands. Musculoskeletal: Expansion of the posterior sixth rib (image 53/6) on the RIGHT. Myeloma. Several lucent lesions in the lower thoracic spine (image 111/6) Review of the MIP images confirms the above findings. IMPRESSION: 1. No acute pulmonary embolism. 2. No acute pulmonary parenchymal findings. 3. Lytic expansion of the posterior RIGHT sixth rib as well as several lytic lesions in the lower thoracic spine. Findings consistent with given history of multiple myeloma. Electronically Signed   By: SSuzy BouchardM.D.   On: 03/15/2022 19:00    Procedures Procedures    Medications Ordered in ED Medications  lidocaine (LIDODERM) 5 % 1 patch (1 patch Transdermal Patch Applied 03/15/22 1815)  acetaminophen (TYLENOL) tablet 650 mg (650 mg Oral Given 03/15/22 1815)  potassium chloride SA (KLOR-CON M) CR tablet 40 mEq (40 mEq Oral Given 03/15/22 1815)  magnesium oxide (MAG-OX) tablet 400 mg (400 mg Oral Given 03/15/22 1815)  iohexol (OMNIPAQUE) 350 MG/ML injection 75 mL (75 mLs Intravenous Contrast Given 03/15/22 1833)    ED Course/ Medical Decision Making/ A&P                           Medical Decision Making Risk OTC drugs. Prescription drug management.   This patient presents to the ED with chief complaint(s) of cp  with pertinent past medical history of afib, dvt, mm, htn which further complicates the presenting complaint. The complaint involves an extensive differential diagnosis and also carries with it a high risk of complications and morbidity.    The differential diagnosis includes but not limited to Differential includes all life-threatening causes for chest pain. This  includes but is not exclusive to acute coronary syndrome, aortic dissection, pulmonary embolism, cardiac tamponade, community-acquired pneumonia, pericarditis, musculoskeletal chest wall pain, etc.  . Serious etiologies were considered.   The initial plan is to screening labs and imaging ordered in triage   Additional history obtained: Additional history obtained from  na Records reviewed Primary Care Documents and home medications, prior ED visits, prior labs/imaigng  Recent ED visit 11/8, RVR, resolved with beta-blocker  Independent labs interpretation:  The following labs were independently interpreted:  CMP with K 3.1, he has intermittent hypok in the past, takes oral replacement, will give replacement in ED w/ mg ox Trop 14 >  CBC with mild thrombocytopenia, otherwise wnl  Independent visualization of imaging: - I independently visualized the following imaging with scope of interpretation limited to determining acute life threatening conditions related to emergency care: CTPE, which revealed findings c/w MM, no PE. He follows with heme/onc here locally   Cardiac monitoring was reviewed and interpreted by myself which shows sinus brady EKG w/ sinus brady  Treatment and Reassessment: Electrolyte replacement Lidoderm Apap >> improved  Consultation: - Consulted or discussed management/test interpretation w/ external professional: na  Consideration for admission or further workup: Admission was considered   The patient's chest pain is not suggestive of pulmonary embolus, cardiac ischemia, aortic dissection, pericarditis, myocarditis, pulmonary embolism, pneumothorax, pneumonia, Zoster, or esophageal perforation, or other serious etiology.  Historically not abrupt in onset, tearing or ripping, pulses symmetric. EKG nonspecific for ischemia/infarction. No dysrhythmias, brugada, WPW, prolonged QT noted.   Troponin negative, pain ongoing x2-3 days, doubt this is secondary to ACS,  EKG wnl.  CTPE reviewed. Labs without demonstration of acute pathology unless otherwise noted above.  Pain appears atypical in nature, favored to be MSK in nature;  improved with apap and lidoderm, discussed supportive care at home, f/u with pcp, strict return precautions were discussed.   The patient improved significantly and was discharged in stable condition. Detailed discussions were had with the patient regarding current findings, and need for close f/u with PCP or on call doctor. The patient has been instructed to return immediately if the symptoms worsen in any way for re-evaluation. Patient verbalized understanding and is in agreement with current care plan. All questions answered prior to discharge.    Social Determinants of health: Social History   Tobacco Use   Smoking status: Never   Smokeless tobacco: Never  Vaping Use   Vaping Use: Never used  Substance Use Topics   Alcohol use: Yes    Alcohol/week: 2.0 standard drinks of alcohol    Types: 2 Glasses of wine per week    Comment: 2 glasses of wine a week, occ bourbon, beer   Drug use: No            Final Clinical Impression(s) / ED Diagnoses Final diagnoses:  Atypical chest pain  Multiple myeloma, remission status unspecified ([place])    Rx / DC Orders ED Discharge Orders     None         Jeanell Sparrow, DO 03/15/22 1942    Jeanell Sparrow, DO 03/15/22 1945

## 2022-03-15 NOTE — Discharge Instructions (Signed)
Return to the Emergency Department if you have unusual chest pain, pressure, or discomfort, shortness of breath, nausea, vomiting, burping, heartburn, tingling upper body parts, sweating, cold, clammy skin, or racing heartbeat. Call 911 if you think you are having a heart attack.  Follow cardiac diet - avoid fatty & fried foods, don't eat too much red meat, eat lots of fruits & vegetables, and dairy products should be low fat. Please lose weight if you are overweight. Become more active with walking, gardening, or any other activity that gets you to moving.   It was a pleasure caring for you today in the emergency department.  Please return to the emergency department immediately for any new or concerning symptoms, or if you get worse.

## 2022-03-15 NOTE — Telephone Encounter (Signed)
Patient c/o Palpitations:  High priority if patient c/o lightheadedness, shortness of breath, or chest pain  How long have you had palpitations/irregular HR/ Afib? Are you having the symptoms now? Last week starting Tuesday night.    Are you currently experiencing lightheadedness, SOB or CP? Pain down left side of ribs and behind shoulder blade during a-fib symptoms.   Do you have a history of afib (atrial fibrillation) or irregular heart rhythm? Yes   Have you checked your BP or HR? (document readings if available): Not today but normal readings on Sunday.   Are you experiencing any other symptoms? Soreness down left side under armpit and down below ribs.    Pt c/o of Chest Pain: STAT if CP now or developed within 24 hours  1. Are you having CP right now? No   2. Are you experiencing any other symptoms (ex. SOB, nausea, vomiting, sweating)? No   3. How long have you been experiencing CP? Since last Tuesday, but mostly following a-fib   4. Is your CP continuous or coming and going? Continuous   5. Have you taken Nitroglycerin? No  ?

## 2022-03-15 NOTE — ED Triage Notes (Signed)
Pt arrived via POV, c/o left sided pain. States soreness worsening x2 wks. Cards advised to come to ED

## 2022-03-15 NOTE — Telephone Encounter (Signed)
Pt called in to report left sided pain/soreness around arm pit radiates to shoulder blade.  Has multiple myeloma and doesn't think the pain is shingles.  Recently went to ED for symptoms AF RVR converted with metoprolol.  Reports BP was okay yesterday.  HR while on phone 50-51 reports normal is high 50's. Is currently taking metoprolol tartrate 50 mg PO BID.   Asked to take a deep breath; reports pain increases a little.  Has not missed any doses of Eliquis.  Rates pain 8:10.  Advised pt to report to ED for evaluation.  Pt reports will go to [facility] advised pt that would be a good choice as oncology department is at [facility].  No further concerns voiced.

## 2022-03-16 DIAGNOSIS — I10 Essential (primary) hypertension: Secondary | ICD-10-CM | POA: Diagnosis not present

## 2022-03-16 DIAGNOSIS — M1611 Unilateral primary osteoarthritis, right hip: Secondary | ICD-10-CM | POA: Diagnosis not present

## 2022-03-16 DIAGNOSIS — L570 Actinic keratosis: Secondary | ICD-10-CM | POA: Diagnosis not present

## 2022-03-16 DIAGNOSIS — D1801 Hemangioma of skin and subcutaneous tissue: Secondary | ICD-10-CM | POA: Diagnosis not present

## 2022-03-16 DIAGNOSIS — Z09 Encounter for follow-up examination after completed treatment for conditions other than malignant neoplasm: Secondary | ICD-10-CM | POA: Diagnosis not present

## 2022-03-16 DIAGNOSIS — L821 Other seborrheic keratosis: Secondary | ICD-10-CM | POA: Diagnosis not present

## 2022-03-16 DIAGNOSIS — E7800 Pure hypercholesterolemia, unspecified: Secondary | ICD-10-CM | POA: Diagnosis not present

## 2022-03-16 DIAGNOSIS — M84421D Pathological fracture, right humerus, subsequent encounter for fracture with routine healing: Secondary | ICD-10-CM | POA: Diagnosis not present

## 2022-03-16 DIAGNOSIS — Z872 Personal history of diseases of the skin and subcutaneous tissue: Secondary | ICD-10-CM | POA: Diagnosis not present

## 2022-03-16 DIAGNOSIS — M6281 Muscle weakness (generalized): Secondary | ICD-10-CM | POA: Diagnosis not present

## 2022-03-16 DIAGNOSIS — D692 Other nonthrombocytopenic purpura: Secondary | ICD-10-CM | POA: Diagnosis not present

## 2022-03-16 DIAGNOSIS — I480 Paroxysmal atrial fibrillation: Secondary | ICD-10-CM | POA: Diagnosis not present

## 2022-03-16 DIAGNOSIS — M25611 Stiffness of right shoulder, not elsewhere classified: Secondary | ICD-10-CM | POA: Diagnosis not present

## 2022-03-16 DIAGNOSIS — L814 Other melanin hyperpigmentation: Secondary | ICD-10-CM | POA: Diagnosis not present

## 2022-03-17 ENCOUNTER — Encounter (HOSPITAL_COMMUNITY): Payer: Self-pay | Admitting: Nurse Practitioner

## 2022-03-17 ENCOUNTER — Ambulatory Visit (HOSPITAL_COMMUNITY)
Admission: RE | Admit: 2022-03-17 | Discharge: 2022-03-17 | Disposition: A | Discharge status: Home / Self Care | Payer: Medicare Other | Source: Ambulatory Visit | Attending: Nurse Practitioner | Admitting: Nurse Practitioner

## 2022-03-17 ENCOUNTER — Other Ambulatory Visit: Payer: Self-pay

## 2022-03-17 VITALS — BP 158/72 | HR 52 | Ht 70.0 in | Wt 252.6 lb

## 2022-03-17 DIAGNOSIS — E669 Obesity, unspecified: Secondary | ICD-10-CM | POA: Diagnosis not present

## 2022-03-17 DIAGNOSIS — I10 Essential (primary) hypertension: Secondary | ICD-10-CM | POA: Insufficient documentation

## 2022-03-17 DIAGNOSIS — C9000 Multiple myeloma not having achieved remission: Secondary | ICD-10-CM | POA: Diagnosis not present

## 2022-03-17 DIAGNOSIS — Z6835 Body mass index (BMI) 35.0-35.9, adult: Secondary | ICD-10-CM | POA: Insufficient documentation

## 2022-03-17 DIAGNOSIS — Z79899 Other long term (current) drug therapy: Secondary | ICD-10-CM | POA: Diagnosis not present

## 2022-03-17 DIAGNOSIS — Z86718 Personal history of other venous thrombosis and embolism: Secondary | ICD-10-CM | POA: Diagnosis not present

## 2022-03-17 DIAGNOSIS — I4892 Unspecified atrial flutter: Secondary | ICD-10-CM | POA: Diagnosis not present

## 2022-03-17 DIAGNOSIS — D6869 Other thrombophilia: Secondary | ICD-10-CM

## 2022-03-17 DIAGNOSIS — G4733 Obstructive sleep apnea (adult) (pediatric): Secondary | ICD-10-CM | POA: Insufficient documentation

## 2022-03-17 DIAGNOSIS — I4891 Unspecified atrial fibrillation: Secondary | ICD-10-CM | POA: Diagnosis not present

## 2022-03-17 DIAGNOSIS — I480 Paroxysmal atrial fibrillation: Secondary | ICD-10-CM

## 2022-03-17 DIAGNOSIS — Z7901 Long term (current) use of anticoagulants: Secondary | ICD-10-CM | POA: Diagnosis not present

## 2022-03-17 NOTE — Progress Notes (Addendum)
Primary Care Physician: Lajean Manes, MD Primary Cardiologist: Dr Harrington Challenger Primary Electrophysiologist: Dr Rayann Heman (remotely) Referring Physician: Dr [name] is a 71 y.o. male with a history of HTN, OSA, prior DVT, MGUS, atrial fibrillation who presents for follow up in the [facility].  The patient underwent afib ablation in 2018 with Dr Rayann Heman and has been maintained on flecainide. Patient is on Eliquis for a CHADS2VASC score of 2. Patient has been undergoing treatment for multiple myloma, finishing radiation but continuing chemotherapy.   He is now in the office as he went to [facility] over the weekend and became presyncopal and found out he was afib with RVR. He was also fluid overloaded with 12 lbs of fluid . He was  admitted 10/28 and   thru Sunday 10/29. Pt states he was diuresed 12 lbs of fluid. He was not aware that he was fluid overloaded. He had his last chemo 10/17. He had BB stopped as he was slightly lightheaded and noted a HR in the 40's. He was placed back on metoprolol 25 mg tid and was d/c in rate controlled afib. He did return to SR on Monday and decreased metoprolol to bid at that point. He is in sinus brady at 55 bpm. He has continued on eliquis 5 mg bid. He is interested in establishing with another EP in Dr. Jackalyn Lombard absence to discuss ablation. Echo was repeated there w/o significant findings.   Travis [name] is now back in afib clinic after having a ED visit, 03/15/22 for afib associated with atypical chest pain 03/15/22 but EKG shows Sinus brady in the ED. No acute findings were found in the ED but CT angio of  the chest to r/o PE did show Lytic lesions of the posterior rt sixth rib as well as several lytic lesions in the lower thoracic spine. He is still having issues with pain under left axilla and into left shoulder blade. He was given a lidocaine patch and used this last night and this did help some.  Feels the discomfort more with  sitting and lying.    EKg today shows SR. He will continue with flecainide 100 mg bid and BB was increased to 50 mg bid. He does not feel as well with this dose with HR's in the low 50's and upper 40's. He wants to go back to lopressor 25 mg tid. Now with pt thinking back, he has had wine for the last 3 breakthrough afib episodes that he has had. He is going to avoid alcohol.   Today, he denies symptoms of chest pain, shortness of breath, orthopnea, PND, lower extremity edema, dizziness, presyncope, syncope, bleeding, or neurologic sequela. The patient is tolerating medications without difficulties and is otherwise without complaint today.    Atrial Fibrillation Risk Factors:  he does have symptoms or diagnosis of sleep apnea. he is compliant with CPAP therapy. he does not have a history of rheumatic fever. he does have a history of alcohol use.   he has a BMI of Body mass index is 35.15 kg/m.[name] There were no vitals filed for this visit.   Family History  Problem Relation Age of Onset   Dementia Mother    Stroke Father    Atrial fibrillation Father    Hypertension Father    Hypertension Paternal Grandfather    Dementia Maternal Grandmother      Atrial Fibrillation Management history:  Previous antiarrhythmic drugs: flecainide  Previous cardioversions: none Previous  ablations: 2018 CHADS2VASC score: 2 Anticoagulation history: Eliquis   Past Medical History:  Diagnosis Date   Arthritis    "comes and goes; mostly in hands, occasionally elbow" (51/0/2585)   Complication of anesthesia    "just wanted to sleep alot  for couple days S/P VARICOCELE OR" (04/05/2017)   DVT (deep venous thrombosis) (HCC)    LLE   Dysrhythmia    PVCs    GERD (gastroesophageal reflux disease)    History of hiatal hernia    Hypertension    Impaired glucose tolerance    Multiple myeloma (HCC)    Obesity (BMI 30-39.9) 07/26/2016   OSA on CPAP 07/26/2016   Mild with AHI 12.5/hr now on CPAP at  14cm H2O   PAF (paroxysmal atrial fibrillation) ([facility])    s/p PVI Ablation in 2018 // Flecainide Rx // Echo 8/21: EF 60-65, no RWMA, mild LVH, normal RV SF, moderate RVE, mild BAE, trivial MR, mild dilation of aortic root (40 mm)   Pneumonia    "may have had walking pneumonia a few years ago" (04/05/2017)   Pre-diabetes    Thrombophlebitis    Past Surgical History:  Procedure Laterality Date   ATRIAL FIBRILLATION ABLATION  04/05/2017   ATRIAL FIBRILLATION ABLATION N/A 04/05/2017   Procedure: ATRIAL FIBRILLATION ABLATION;  Surgeon: Thompson Grayer, MD;  Location: [facility];  Service: Cardiovascular;  Laterality: N/A;   BONE BIOPSY Right 12/17/2021   Procedure: BONE BIOPSY;  Surgeon: Hiram Gash, MD;  Location: [facility];  Service: Orthopedics;  Laterality: Right;   COMPLETE RIGHT HIP REPLACMENT  01/19/2019   EVALUATION UNDER ANESTHESIA WITH HEMORRHOIDECTOMY N/A 02/24/2017   Procedure: EXAM UNDER ANESTHESIA WITH  HEMORRHOIDECTOMY;  Surgeon: Michael Boston, MD;  Location: [facility];  Service: General;  Laterality: N/A;   EXCISIONAL TOTAL HIP ARTHROPLASTY WITH ANTIBIOTIC SPACERS Right 09/25/2020   Procedure: EXCISIONAL TOTAL HIP ARTHROPLASTY WITH ANTIBIOTIC SPACERS;  Surgeon: Paralee Cancel, MD;  Location: [facility];  Service: Orthopedics;  Laterality: Right;   FLEXIBLE SIGMOIDOSCOPY N/A 04/15/2015   Procedure:  UNSEDATED FLEXIBLE SIGMOIDOSCOPY;  Surgeon: [name], MD;  [facility];  Service: Endoscopy;  Laterality: N/A;   HUMERUS IM NAIL Right 12/17/2021   Procedure: INTRAMEDULLARY (IM) NAIL HUMERAL;  Surgeon: Hiram Gash, MD;  Location: [facility];  Service: Orthopedics;  Laterality: Right;   KNEE ARTHROSCOPY Left ~ 2016   REIMPLANTATION OF TOTAL HIP Right 12/25/2020   Procedure: REIMPLANTATION/REVISION OF RIGHT TOTAL HIP VERSUS REPEAT IRRIGATION AND DEBRIDEMENT;  Surgeon: Paralee Cancel, MD;  Location: [facility];  Service: Orthopedics;  Laterality:  Right;   TESTICLE SURGERY  1988   VARICOCELE   TONSILLECTOMY     VARICOSE VEIN SURGERY Bilateral     Current Outpatient Medications  Medication Sig Dispense Refill   acetaminophen (TYLENOL) 325 MG tablet Take 2 tablets (650 mg total) by mouth every 6 (six) hours as needed. 36 tablet 0   acyclovir (ZOVIRAX) 400 MG tablet Take 1 tablet (400 mg total) by mouth 2 (two) times daily. 180 tablet 3   amLODipine (NORVASC) 10 MG tablet Take 10 mg by mouth daily.      apixaban (ELIQUIS) 5 MG TABS tablet TAKE ONE TABLET BY MOUTH TWICE A DAY 60 tablet 6   Ascorbic Acid (VITAMIN C) 100 MG tablet Take 100 mg by mouth daily.     cholecalciferol (VITAMIN D3) 25 MCG (1000 UNIT) tablet Take 1,000 Units by mouth daily.  clobetasol cream (TEMOVATE) 9.79 % Apply 1 Application topically daily as needed (rash).     Cyanocobalamin (VITAMIN B-12 PO) Take 1 tablet by mouth daily. Not sure the dosage     dexamethasone (DECADRON) 4 MG tablet Take 10 tablets (40 mg total) by mouth once a week. 40 tablet 5   flecainide (TAMBOCOR) 50 MG tablet Take 2 tablets (100 mg total) by mouth 2 (two) times daily.     fluticasone (FLONASE) 50 MCG/ACT nasal spray Place 1 spray into both nostrils daily as needed for allergies or rhinitis.     folic acid (FOLVITE) 1 MG tablet Take 1 mg by mouth daily.     furosemide (LASIX) 40 MG tablet Take 40 mg by mouth daily as needed for fluid or edema.     gabapentin (NEURONTIN) 300 MG capsule TAKE TWO CAPSULES BY MOUTH TWICE A DAY 360 capsule 0   hydrALAZINE (APRESOLINE) 10 MG tablet Take 1 tablet (10 mg total) by mouth 2 (two) times daily. AS NEEDED FOR HIGH BP 180 tablet 3   hydrochlorothiazide (HYDRODIURIL) 25 MG tablet TAKE ONE TABLET BY MOUTH ONE TIME DAILY 90 tablet 3   lenalidomide (REVLIMID) 25 MG capsule Take 1 capsule (25 mg total) by mouth daily. Take 21 days on, 7 days off, repeat every 28 days. Celgene Josem Kaufmann #[id number] Date obtained: 03/12/22 21 capsule 0   lidocaine (LIDODERM) 5 %  Place 1 patch onto the skin daily as needed. Remove & Discard patch within 12 hours or as directed by MD 15 patch 0   lisinopril (ZESTRIL) 40 MG tablet TAKE ONE TABLET BY MOUTH ONE TIME DAILY 90 tablet 3   methocarbamol (ROBAXIN) 500 MG tablet Take 1 tablet (500 mg total) by mouth 2 (two) times daily for 5 days. 10 tablet 0   metoprolol tartrate (LOPRESSOR) 25 MG tablet Take 25 mg by mouth in the morning, at noon, and at bedtime.     oxymetazoline (AFRIN) 0.05 % nasal spray Place 1 spray into both nostrils 2 (two) times daily as needed for congestion.     potassium chloride SA (KLOR-CON M) 20 MEQ tablet TAKE ONE TABLET BY MOUTH ONE TIME DAILY, TAKE 40 MG (2 TABLETS) ON DAYS THAT YOU ALSO TAKE LASIX (FUROSEMIDE) 135 tablet 3   prochlorperazine (COMPAZINE) 10 MG tablet Take 1 tablet (10 mg total) by mouth every 6 (six) hours as needed for nausea or vomiting. 30 tablet 0   psyllium (METAMUCIL) 58.6 % packet Take 1 packet by mouth daily as needed (constipation).     sodium chloride (OCEAN) 0.65 % SOLN nasal spray Place 1 spray into both nostrils as needed for congestion.     triamcinolone cream (KENALOG) 0.1 % Apply 1 application topically daily as needed (itching).     No current facility-administered medications for this encounter.    Allergies  Allergen Reactions   Linezolid Other (See Comments)    Burning tongue    Vancomycin Rash    Social History   Socioeconomic History   Marital status: Married    Spouse name: Not on file   Number of children: 1   Years of education: law school   Highest education level: Not on file  Occupational History   Occupation: lawyer  Tobacco Use   Smoking status: Never   Smokeless tobacco: Never  Vaping Use   Vaping Use: Never used  Substance and Sexual Activity   Alcohol use: Yes    Alcohol/week: 2.0 standard drinks of alcohol  Types: 2 Glasses of wine per week    Comment: 2 glasses of wine a week, occ bourbon, beer   Drug use: No   Sexual  activity: Not Currently  Other Topics Concern   Not on file  Social History Narrative   Lives in [place] with spouse in a 2 story home.  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    Social Determinants of Health   Financial Resource Strain: Not on file  Food Insecurity: Not on file  Transportation Needs: Not on file  Physical Activity: Not on file  Stress: Not on file  Social Connections: Not on file  Intimate Partner Violence: Not on file     ROS- All systems are reviewed and negative except as per the HPI above.  Physical Exam: Vitals:   03/17/22 1533  Height: 5' 10" (1.778 m)    GEN- The patient is a well appearing obese male, alert and oriented x 3 today.   Head- normocephalic, atraumatic Eyes-  Sclera clear, conjunctiva pink Ears- hearing intact Oropharynx- clear Neck- supple  Lungs- Clear to ausculation bilaterally, normal work of breathing Heart- regular rate and rhythm, no murmurs, rubs or gallops  GI- soft, NT, ND, + BS Extremities- no clubbing, cyanosis, or edema, R arm in sling MS- no significant deformity or atrophy Skin- no rash or lesion Psych- euthymic mood, full affect Neuro- strength and sensation are intact  Wt Readings from Last 3 Encounters:  03/10/22 111.1 kg  03/08/22 113.2 kg  03/05/22 113.7 kg    EKG-Vent. rate 52 BPM PR interval 188 ms QRS duration 100 ms QT/QTcB 514/478 ms P-R-T axes 73 82 60 Sinus bradycardia Otherwise normal ECG When compared with ECG of 15-Mar-2022 12:23, PREVIOUS ECG IS PRESENT    Echo -01/2022  FINDINGS: Left Heart: --There is no left atrial dilatation (LA volume index 24 ml/m). --The interventricular septum is normal in thickness. The inferolateral (posterior) wall is normal. There is no asymmetric septal hypertrophy. There is no left ventricular hypertrophy. The left ventricle has normal end-diastolic diameter. --LV analysis by 2D  Simpson's method: LV ejection fraction is normal (59 %). --Global LV wall motion is normal. LV ejection fraction is normal. Mitral Valve: --The mitral valve is normal. There is no mitral stenosis. There is trace mitral regurgitation. Aortic Valve: --The aortic valve is normal. Aortic valve is trileaflet. There is no aortic stenosis. There is no aortic regurgitation.   Epic records are reviewed at length today  CHA2DS2-VASc Score = 2  The patient's score is based upon: CHF History: 0 HTN History: 1 Diabetes History: 0 Stroke History: 0 Vascular Disease History: 0 Age Score: 1 Gender Score: 0       ASSESSMENT AND PLAN: 1. Persistent Atrial Fibrillation/atrial flutter The patient's CHA2DS2-VASc score is 2, indicating a 2.2% annual risk of stroke.   Patient had been in [place] until recent trip to [facility] few weeks past,  BB increased and pt quickly returned to [place], then had another episode last week that followed alcohol use, he has been in SR since last week and is now avoiding alcohol He would like to have another ablation Will refer to Dr. Quentin Ore to discuss Continue flecainide  100 mg bid  Continue metoprolol tartrate 25 mg tid Continue Eliquis 5 mg BID  2. Secondary Hypercoagulable State (ICD10:  D68.69) The patient is at significant risk for stroke/thromboembolism based upon his CHA2DS2-VASc Score of  2.  Continue Apixaban (Eliquis).   3. Obesity Body mass index is 35.15 kg/m. Lifestyle modification was discussed at length including regular exercise and weight reduction.  4. Obstructive sleep apnea Encouraged compliance with CPAP therapy.  5. HTN Stable, med changes as above.   6. Multiple Myeloma Followed by Dr Lorenso Courier,  Infusions as scheduled  Pt will recent persistent left sided/axilla/left scapula pain, he will discuss with oncology on Monday with his appointment,discussed with him on 11/6 visit    F/u with Dr. Quentin Ore in January as scheduled to get established     Butch Penny C. Marili Vader, [facility] [address] [place], [place] [address] [phone]

## 2022-03-18 ENCOUNTER — Ambulatory Visit (HOSPITAL_COMMUNITY): Payer: Medicare Other | Admitting: Nurse Practitioner

## 2022-03-18 DIAGNOSIS — C9002 Multiple myeloma in relapse: Secondary | ICD-10-CM | POA: Diagnosis not present

## 2022-03-18 DIAGNOSIS — M84421D Pathological fracture, right humerus, subsequent encounter for fracture with routine healing: Secondary | ICD-10-CM | POA: Diagnosis not present

## 2022-03-18 DIAGNOSIS — M25512 Pain in left shoulder: Secondary | ICD-10-CM | POA: Diagnosis not present

## 2022-03-18 DIAGNOSIS — I4891 Unspecified atrial fibrillation: Secondary | ICD-10-CM | POA: Diagnosis not present

## 2022-03-18 DIAGNOSIS — Z86010 Personal history of colonic polyps: Secondary | ICD-10-CM | POA: Diagnosis not present

## 2022-03-19 ENCOUNTER — Telehealth: Payer: Self-pay | Admitting: *Deleted

## 2022-03-19 ENCOUNTER — Telehealth: Payer: Self-pay

## 2022-03-19 DIAGNOSIS — M6281 Muscle weakness (generalized): Secondary | ICD-10-CM | POA: Diagnosis not present

## 2022-03-19 DIAGNOSIS — M25611 Stiffness of right shoulder, not elsewhere classified: Secondary | ICD-10-CM | POA: Diagnosis not present

## 2022-03-19 DIAGNOSIS — M84421D Pathological fracture, right humerus, subsequent encounter for fracture with routine healing: Secondary | ICD-10-CM | POA: Diagnosis not present

## 2022-03-19 NOTE — Telephone Encounter (Signed)
Received vm message from pt asking about a steroid injection he got in his shoulder this week. TCT patient and spoke with him. He states he received a steroid injection in his shoulder this week. He wanted to make sure he could take his oral steroids next week on the day of his treatment. Advised that he still needs to take the oral steroids as scheduled next week prior to his treatment here. He voiced understanding. "I just wanted to make sure". No other questions or concerns.

## 2022-03-19 NOTE — Telephone Encounter (Signed)
     Patient  visit on 11/13  at [facility]    Have you been able to follow up with your primary care physician? Yes   The patient was or was not able to obtain any needed medicine or equipment. Yes   Are there diet recommendations that you are having difficulty following? Na   Patient expresses understanding of discharge instructions and education provided has no other needs at this time.  Yes     [place], [facility], Care Management  [phone] [address], [place], [place] [address] Phone: [phone] Email: [email or web address]

## 2022-03-22 DIAGNOSIS — M84421D Pathological fracture, right humerus, subsequent encounter for fracture with routine healing: Secondary | ICD-10-CM | POA: Diagnosis not present

## 2022-03-22 DIAGNOSIS — M25611 Stiffness of right shoulder, not elsewhere classified: Secondary | ICD-10-CM | POA: Diagnosis not present

## 2022-03-22 DIAGNOSIS — M6281 Muscle weakness (generalized): Secondary | ICD-10-CM | POA: Diagnosis not present

## 2022-03-22 NOTE — Progress Notes (Signed)
  Radiation Oncology         [phone]) [phone] ________________________________  Name: Travis [name] MRN: [id number]  Date of Service: 03/24/2022  DOB: Sep 16, 1950  Post Treatment Telephone Note  Diagnosis:  71 year old gentleman multiple myeloma with lytic lesions in the right humerus, right ulna and right 6th rib. (as documented in provider EOT note)   The patient was available for call today.  The patient did note fatigue during radiation but has since improved. The patient did not note skin changes in the field of radiation during therapy. The patient has noticed improvement in pain in the area(s) treated with radiation. The patient is taking dexamethasone. The patient does not have symptoms of  weakness or loss of control of the extremities. The patient does not have symptoms of headache. The patient does not have symptoms of seizure or uncontrolled movement. The patient does not have symptoms of changes in vision. The patient does not have changes in speech. The patient does not have confusion.  Patient's current complaint is muscle pain in the LT scapula 8/10. X-rays have been taken and patient was told by ortho that Dr. Lorenso Courier needs to examine this area.   The patient is scheduled for ongoing care with Dr. Lorenso Courier in medical oncology. The patient was encouraged to call if he  develop concerns or questions regarding radiation.   This concludes the nursing interview.  Leandra Kern, LPN

## 2022-03-23 ENCOUNTER — Other Ambulatory Visit: Payer: Self-pay

## 2022-03-23 ENCOUNTER — Inpatient Hospital Stay (HOSPITAL_BASED_OUTPATIENT_CLINIC_OR_DEPARTMENT_OTHER): Payer: Medicare Other | Admitting: Physician Assistant

## 2022-03-23 ENCOUNTER — Inpatient Hospital Stay: Payer: Medicare Other

## 2022-03-23 ENCOUNTER — Encounter: Payer: Self-pay | Admitting: Hematology and Oncology

## 2022-03-23 VITALS — BP 134/66 | HR 57 | Temp 97.8°F | Resp 18 | Ht 70.0 in | Wt 252.1 lb

## 2022-03-23 DIAGNOSIS — Z7289 Other problems related to lifestyle: Secondary | ICD-10-CM | POA: Diagnosis not present

## 2022-03-23 DIAGNOSIS — Z5112 Encounter for antineoplastic immunotherapy: Secondary | ICD-10-CM | POA: Diagnosis not present

## 2022-03-23 DIAGNOSIS — Z7901 Long term (current) use of anticoagulants: Secondary | ICD-10-CM | POA: Diagnosis not present

## 2022-03-23 DIAGNOSIS — C9000 Multiple myeloma not having achieved remission: Secondary | ICD-10-CM

## 2022-03-23 DIAGNOSIS — Z86718 Personal history of other venous thrombosis and embolism: Secondary | ICD-10-CM | POA: Diagnosis not present

## 2022-03-23 DIAGNOSIS — Z79899 Other long term (current) drug therapy: Secondary | ICD-10-CM | POA: Diagnosis not present

## 2022-03-23 DIAGNOSIS — E876 Hypokalemia: Secondary | ICD-10-CM | POA: Diagnosis not present

## 2022-03-23 DIAGNOSIS — Z8249 Family history of ischemic heart disease and other diseases of the circulatory system: Secondary | ICD-10-CM | POA: Diagnosis not present

## 2022-03-23 DIAGNOSIS — M25512 Pain in left shoulder: Secondary | ICD-10-CM | POA: Diagnosis not present

## 2022-03-23 DIAGNOSIS — R21 Rash and other nonspecific skin eruption: Secondary | ICD-10-CM | POA: Diagnosis not present

## 2022-03-23 DIAGNOSIS — I480 Paroxysmal atrial fibrillation: Secondary | ICD-10-CM | POA: Diagnosis not present

## 2022-03-23 DIAGNOSIS — Z79624 Long term (current) use of inhibitors of nucleotide synthesis: Secondary | ICD-10-CM | POA: Diagnosis not present

## 2022-03-23 DIAGNOSIS — R0781 Pleurodynia: Secondary | ICD-10-CM | POA: Diagnosis not present

## 2022-03-23 DIAGNOSIS — Z881 Allergy status to other antibiotic agents status: Secondary | ICD-10-CM | POA: Diagnosis not present

## 2022-03-23 DIAGNOSIS — Z7961 Long term (current) use of immunomodulator: Secondary | ICD-10-CM | POA: Diagnosis not present

## 2022-03-23 DIAGNOSIS — Z823 Family history of stroke: Secondary | ICD-10-CM | POA: Diagnosis not present

## 2022-03-23 LAB — CMP (CANCER CENTER ONLY)
ALT: 25 U/L (ref 0–44)
AST: 14 U/L — ABNORMAL LOW (ref 15–41)
Albumin: 4.2 g/dL (ref 3.5–5.0)
Alkaline Phosphatase: 122 U/L (ref 38–126)
Anion gap: 5 (ref 5–15)
BUN: 14 mg/dL (ref 8–23)
CO2: 34 mmol/L — ABNORMAL HIGH (ref 22–32)
Calcium: 9.3 mg/dL (ref 8.9–10.3)
Chloride: 102 mmol/L (ref 98–111)
Creatinine: 0.79 mg/dL (ref 0.61–1.24)
GFR, Estimated: >60 mL/min (ref >60)
Glucose, Bld: 104 mg/dL — ABNORMAL HIGH (ref 70–99)
Potassium: 3.1 mmol/L — ABNORMAL LOW (ref 3.5–5.1)
Sodium: 141 mmol/L (ref 135–145)
Total Bilirubin: 0.5 mg/dL (ref 0.3–1.2)
Total Protein: 6 g/dL — ABNORMAL LOW (ref 6.5–8.1)

## 2022-03-23 LAB — CBC WITH DIFFERENTIAL (CANCER CENTER ONLY)
Abs Immature Granulocytes: 0.02 10*3/uL (ref 0.00–0.07)
Basophils Absolute: 0 10*3/uL (ref 0.0–0.1)
Basophils Relative: 1 %
Eosinophils Absolute: 0.4 10*3/uL (ref 0.0–0.5)
Eosinophils Relative: 10 %
HCT: 41.2 % (ref 39.0–52.0)
Hemoglobin: 14.1 g/dL (ref 13.0–17.0)
Immature Granulocytes: 0 %
Lymphocytes Relative: 9 %
Lymphs Abs: 0.4 10*3/uL — ABNORMAL LOW (ref 0.7–4.0)
MCH: 32.2 pg (ref 26.0–34.0)
MCHC: 34.2 g/dL (ref 30.0–36.0)
MCV: 94.1 fL (ref 80.0–100.0)
Monocytes Absolute: 0.6 10*3/uL (ref 0.1–1.0)
Monocytes Relative: 14 %
Neutro Abs: 3.1 10*3/uL (ref 1.7–7.7)
Neutrophils Relative %: 66 %
Platelet Count: 161 10*3/uL (ref 150–400)
RBC: 4.38 MIL/uL (ref 4.22–5.81)
RDW: 15.2 % (ref 11.5–15.5)
WBC Count: 4.6 10*3/uL (ref 4.0–10.5)
nRBC: 0 % (ref 0.0–0.2)

## 2022-03-23 LAB — LACTATE DEHYDROGENASE: LDH: 126 U/L (ref 98–192)

## 2022-03-23 MED ORDER — ACETAMINOPHEN 325 MG PO TABS
650.0000 mg | ORAL_TABLET | Freq: Once | ORAL | Status: AC
  Administered 2022-03-23: 650 mg via ORAL
  Filled 2022-03-23: qty 2

## 2022-03-23 MED ORDER — DIPHENHYDRAMINE HCL 25 MG PO CAPS
50.0000 mg | ORAL_CAPSULE | Freq: Once | ORAL | Status: AC
  Administered 2022-03-23: 50 mg via ORAL
  Filled 2022-03-23: qty 2

## 2022-03-23 MED ORDER — DARATUMUMAB-HYALURONIDASE-FIHJ 1800-30000 MG-UT/15ML ~~LOC~~ SOLN
1800.0000 mg | Freq: Once | SUBCUTANEOUS | Status: AC
  Administered 2022-03-23: 1800 mg via SUBCUTANEOUS
  Filled 2022-03-23: qty 15

## 2022-03-23 MED ORDER — POTASSIUM CHLORIDE CRYS ER 20 MEQ PO TBCR
40.0000 meq | EXTENDED_RELEASE_TABLET | Freq: Once | ORAL | Status: AC
  Administered 2022-03-23: 40 meq via ORAL
  Filled 2022-03-23: qty 2

## 2022-03-23 NOTE — Progress Notes (Signed)
                                                                                                                                                             Patient Name: Travis [name] MRN: [id number] DOB: 15-Jan-1951 Referring Physician: Nicholas Lose (Profile Not Attached) Date of Service: 01/19/2022 [facility],                                                         End Of Treatment Note  Diagnoses: C90.00-Multiple myeloma not having achieved remission  Cancer Staging:  71 year old gentleman multiple myeloma with lytic lesions in the right humerus, right ulna and right 6th rib   Intent: Palliative  Radiation Treatment Dates: 01/06/2022 through 01/19/2022 Site Technique Total Dose (Gy) Dose per Fx (Gy) Completed Fx Beam Energies  Humerus, Right: Ext_R_Humerus Complex 20/20 2 10/10 6X, 10X  Arm, Right: Ext_R_Forearm Complex 20/20 2 10/10 6X  Chest, Right: Chest_R_Rib Complex 20/20 2 10/10 10X   Narrative: The patient tolerated radiation therapy relatively well without any ill side effects.  Plan: The patient will receive a call in about one month from the radiation oncology department. He will continue follow up with Dr. Lorenso Courier as well.    Nicholos Johns, PA-C    Tyler Pita, MD  [facility] Direct Dial: [phone]  Fax: [phone] [email or web address]  Skype  LinkedIn

## 2022-03-23 NOTE — Patient Instructions (Signed)
[facility]  Discharge Instructions: Thank you for choosing [facility] to provide your oncology and hematology care.   If you have a lab appointment with the [facility], please go directly to the [facility] and check in at the registration area.   Wear comfortable clothing and clothing appropriate for easy access to any Portacath or PICC line.   We strive to give you quality time with your provider. You may need to reschedule your appointment if you arrive late (15 or more minutes).  Arriving late affects you and other patients whose appointments are after yours.  Also, if you miss three or more appointments without notifying the office, you may be dismissed from the clinic at the provider's discretion.      For prescription refill requests, have your pharmacy contact our office and allow 72 hours for refills to be completed.    Today you received the following chemotherapy and/or immunotherapy agents :  Daratumumab      To help prevent nausea and vomiting after your treatment, we encourage you to take your nausea medication as directed.  BELOW ARE SYMPTOMS THAT SHOULD BE REPORTED IMMEDIATELY: *FEVER GREATER THAN 100.4 F (38 C) OR HIGHER *CHILLS OR SWEATING *NAUSEA AND VOMITING THAT IS NOT CONTROLLED WITH YOUR NAUSEA MEDICATION *UNUSUAL SHORTNESS OF BREATH *UNUSUAL BRUISING OR BLEEDING *URINARY PROBLEMS (pain or burning when urinating, or frequent urination) *BOWEL PROBLEMS (unusual diarrhea, constipation, pain near the anus) TENDERNESS IN MOUTH AND THROAT WITH OR WITHOUT PRESENCE OF ULCERS (sore throat, sores in mouth, or a toothache) UNUSUAL RASH, SWELLING OR PAIN  UNUSUAL VAGINAL DISCHARGE OR ITCHING   Items with * indicate a potential emergency and should be followed up as soon as possible or go to the Emergency Department if any problems should occur.  Please show the CHEMOTHERAPY ALERT CARD or IMMUNOTHERAPY ALERT CARD at check-in  to the Emergency Department and triage nurse.  Should you have questions after your visit or need to cancel or reschedule your appointment, please contact [facility]  Dept: [phone]  and follow the prompts.  Office hours are 8:00 a.m. to 4:30 p.m. Monday - Friday. Please note that voicemails left after 4:00 p.m. may not be returned until the following business day.  We are closed weekends and major holidays. You have access to a nurse at all times for urgent questions. Please call the main number to the clinic Dept: [phone] and follow the prompts.   For any non-urgent questions, you may also contact your provider using MyChart. We now offer e-Visits for anyone 70 and older to request care online for non-urgent symptoms. For details visit [email or web address].   Also download the MyChart app! Go to the app store, search "MyChart", open the app, select [facility], and log in with your MyChart username and password.  Masks are optional in the cancer centers. If you would like for your care team to wear a mask while they are taking care of you, please let them know. You may have one support person who is at least 71 years old accompany you for your appointments.

## 2022-03-23 NOTE — Progress Notes (Signed)
[facility] [phone]) [phone]   [phone]  PROGRESS NOTE  Patient Care Team: Lajean Manes, MD as PCP - General (Internal Medicine) Fay Records, MD as PCP - Cardiology (Cardiology) Sueanne Margarita, MD as PCP - Sleep Medicine (Cardiology) Thompson Grayer, MD as PCP - Electrophysiology (Cardiology) Michael Boston, MD as Consulting Physician (General Surgery) Clarene Essex, MD as Consulting Physician (Gastroenterology) Sueanne Margarita, MD as Consulting Physician (Sleep Medicine) Alda Berthold, DO as Consulting Physician (Neurology)  Hematological/Oncological History # IgA Kappa Multiple Myeloma 05/05/2020: Bone marrow biopsy showed increased number of plasma cells representing 14% of all cells in the aspirate associated with numerous variably sized clusters in the clot and biopsy sections 04/10/2021: M protein 0.3, IgA kappa specificity. Kappa 580.5, Labda 7.0, ratio 82.93.   12/07/2021: Labs show of 1971.3, lambda 5.5, ratio 358.42 12/10/2021: Transition care to Dr. Lorenso Courier. 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  01/26/2022: Cycle 2 Day 1 of Dara/Rev/Dex 02/22/2022: Cycle 3 Day 1 of Dara/Rev/Dex 03/23/2022: Cycle 4 Day 1 of Dara/Rev/Dex  Interval History:  Travis [name] 71 y.o. male with medical history significant for IgA Kappa Multiple Myeloma who presents for a follow up visit. The patient's last visit was on 03/08/2022. In the interim since the last visit he has continued of Dara/Rev/Dex.  On exam today Travis [name] is accompanied by his wife. Travis [name] reports having persistent pain in the left posterior shoulder that radiates to the left ribs. He rates the pain as 8 out 10. He currently takes Tylenol with some relief of pain and has tramadol at home if needed. He denies any changes to his energy or appetite. He is tolerating  Revlimid without any toxicities. His bowel habits are unchanged without any recurrent episodes of diarrhea or constipation. He denies easy bruising or signs of active bleeding. He denies any fevers, chills, sweats, shortness of breath, chest pain.  A full 10 point ROS is listed below.  Overall he is willing and able to proceed with treatment today.  MEDICAL HISTORY:  Past Medical History:  Diagnosis Date   Arthritis    "comes and goes; mostly in hands, occasionally elbow" (16/08/5372)   Complication of anesthesia    "just wanted to sleep alot  for couple days S/P VARICOCELE OR" (04/05/2017)   DVT (deep venous thrombosis) (HCC)    LLE   Dysrhythmia    PVCs    GERD (gastroesophageal reflux disease)    History of hiatal hernia    Hypertension    Impaired glucose tolerance    Multiple myeloma (HCC)    Obesity (BMI 30-39.9) 07/26/2016   OSA on CPAP 07/26/2016   Mild with AHI 12.5/hr now on CPAP at 14cm H2O   PAF (paroxysmal atrial fibrillation) ([facility])    s/p PVI Ablation in 2018 // Flecainide Rx // Echo 8/21: EF 60-65, no RWMA, mild LVH, normal RV SF, moderate RVE, mild BAE, trivial MR, mild dilation of aortic root (40 mm)   Pneumonia    "may have had walking pneumonia a few years ago" (04/05/2017)   Pre-diabetes    Thrombophlebitis     SURGICAL HISTORY: Past Surgical History:  Procedure Laterality Date   ATRIAL FIBRILLATION ABLATION  04/05/2017   ATRIAL FIBRILLATION ABLATION N/A 04/05/2017   Procedure: ATRIAL FIBRILLATION ABLATION;  Surgeon: Thompson Grayer, MD;  Location: [facility];  Service: Cardiovascular;  Laterality: N/A;  BONE BIOPSY Right 12/17/2021   Procedure: BONE BIOPSY;  Surgeon: Hiram Gash, MD;  Location: [facility];  Service: Orthopedics;  Laterality: Right;   COMPLETE RIGHT HIP REPLACMENT  01/19/2019   EVALUATION UNDER ANESTHESIA WITH HEMORRHOIDECTOMY N/A 02/24/2017   Procedure: EXAM UNDER ANESTHESIA WITH  HEMORRHOIDECTOMY;  Surgeon: Michael Boston, MD;  Location: [facility];  Service: General;  Laterality: N/A;   EXCISIONAL TOTAL HIP ARTHROPLASTY WITH ANTIBIOTIC SPACERS Right 09/25/2020   Procedure: EXCISIONAL TOTAL HIP ARTHROPLASTY WITH ANTIBIOTIC SPACERS;  Surgeon: Paralee Cancel, MD;  Location: [facility];  Service: Orthopedics;  Laterality: Right;   FLEXIBLE SIGMOIDOSCOPY N/A 04/15/2015   Procedure:  UNSEDATED FLEXIBLE SIGMOIDOSCOPY;  Surgeon: [name], MD;  Location: [facility];  Service: Endoscopy;  Laterality: N/A;   HUMERUS IM NAIL Right 12/17/2021   Procedure: INTRAMEDULLARY (IM) NAIL HUMERAL;  Surgeon: Hiram Gash, MD;  Location: [facility];  Service: Orthopedics;  Laterality: Right;   KNEE ARTHROSCOPY Left ~ 2016   REIMPLANTATION OF TOTAL HIP Right 12/25/2020   Procedure: REIMPLANTATION/REVISION OF RIGHT TOTAL HIP VERSUS REPEAT IRRIGATION AND DEBRIDEMENT;  Surgeon: Paralee Cancel, MD;  Location: [facility];  Service: Orthopedics;  Laterality: Right;   TESTICLE SURGERY  1988   VARICOCELE   TONSILLECTOMY     VARICOSE VEIN SURGERY Bilateral     SOCIAL HISTORY: Social History   Socioeconomic History   Marital status: Married    Spouse name: Not on file   Number of children: 1   Years of education: law school   Highest education level: Not on file  Occupational History   Occupation: lawyer  Tobacco Use   Smoking status: Never   Smokeless tobacco: Never  Vaping Use   Vaping Use: Never used  Substance and Sexual Activity   Alcohol use: Yes    Alcohol/week: 2.0 standard drinks of alcohol    Types: 2 Glasses of wine per week    Comment: 2 glasses of wine a week, occ bourbon, beer   Drug use: No   Sexual activity: Not Currently  Other Topics Concern   Not on file  Social History Narrative   Lives in [place] with spouse in a 2 story home.  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    Social Determinants of Health   Financial Resource  Strain: Not on file  Food Insecurity: Not on file  Transportation Needs: Not on file  Physical Activity: Not on file  Stress: Not on file  Social Connections: Not on file  Intimate Partner Violence: Not on file    FAMILY HISTORY: Family History  Problem Relation Age of Onset   Dementia Mother    Stroke Father    Atrial fibrillation Father    Hypertension Father    Hypertension Paternal Grandfather    Dementia Maternal Grandmother     ALLERGIES:  is allergic to linezolid and vancomycin.  MEDICATIONS:  Current Outpatient Medications  Medication Sig Dispense Refill   acetaminophen (TYLENOL) 325 MG tablet Take 2 tablets (650 mg total) by mouth every 6 (six) hours as needed. 36 tablet 0   acyclovir (ZOVIRAX) 400 MG tablet Take 1 tablet (400 mg total) by mouth 2 (two) times daily. 180 tablet 3   amLODipine (NORVASC) 10 MG tablet Take 10 mg by mouth daily.      apixaban (ELIQUIS) 5 MG TABS tablet TAKE ONE TABLET BY MOUTH TWICE  A DAY 60 tablet 6   Ascorbic Acid (VITAMIN C) 100 MG tablet Take 100 mg by mouth daily.     cholecalciferol (VITAMIN D3) 25 MCG (1000 UNIT) tablet Take 1,000 Units by mouth daily.     clobetasol cream (TEMOVATE) 0.04 % Apply 1 Application topically daily as needed (rash).     Cyanocobalamin (VITAMIN B-12 PO) Take 1 tablet by mouth daily. Not sure the dosage     dexamethasone (DECADRON) 4 MG tablet Take 10 tablets (40 mg total) by mouth once a week. (Patient taking differently: Take 40 mg by mouth every 14 (fourteen) days.) 40 tablet 5   flecainide (TAMBOCOR) 50 MG tablet Take 2 tablets (100 mg total) by mouth 2 (two) times daily.     fluticasone (FLONASE) 50 MCG/ACT nasal spray Place 1 spray into both nostrils daily as needed for allergies or rhinitis.     folic acid (FOLVITE) 1 MG tablet Take 1 mg by mouth daily.     furosemide (LASIX) 40 MG tablet Take 40 mg by mouth daily as needed for fluid or edema.     gabapentin (NEURONTIN) 300 MG capsule TAKE TWO CAPSULES  BY MOUTH TWICE A DAY 360 capsule 0   hydrALAZINE (APRESOLINE) 10 MG tablet Take 1 tablet (10 mg total) by mouth 2 (two) times daily. AS NEEDED FOR HIGH BP 180 tablet 3   hydrochlorothiazide (HYDRODIURIL) 25 MG tablet TAKE ONE TABLET BY MOUTH ONE TIME DAILY 90 tablet 3   lenalidomide (REVLIMID) 25 MG capsule Take 1 capsule (25 mg total) by mouth daily. Take 21 days on, 7 days off, repeat every 28 days. [employer] Josem Kaufmann #[id number] Date obtained: 03/12/22 21 capsule 0   lidocaine (LIDODERM) 5 % Place 1 patch onto the skin daily as needed. Remove & Discard patch within 12 hours or as directed by MD 15 patch 0   lisinopril (ZESTRIL) 40 MG tablet TAKE ONE TABLET BY MOUTH ONE TIME DAILY 90 tablet 3   metoprolol tartrate (LOPRESSOR) 25 MG tablet Take 25 mg by mouth in the morning, at noon, and at bedtime.     oxymetazoline (AFRIN) 0.05 % nasal spray Place 1 spray into both nostrils 2 (two) times daily as needed for congestion.     potassium chloride SA (KLOR-CON M) 20 MEQ tablet TAKE ONE TABLET BY MOUTH ONE TIME DAILY, TAKE 40 MG (2 TABLETS) ON DAYS THAT YOU ALSO TAKE LASIX (FUROSEMIDE) 135 tablet 3   prochlorperazine (COMPAZINE) 10 MG tablet Take 1 tablet (10 mg total) by mouth every 6 (six) hours as needed for nausea or vomiting. 30 tablet 0   psyllium (METAMUCIL) 58.6 % packet Take 1 packet by mouth daily as needed (constipation).     sodium chloride (OCEAN) 0.65 % SOLN nasal spray Place 1 spray into both nostrils as needed for congestion.     triamcinolone cream (KENALOG) 0.1 % Apply 1 application topically daily as needed (itching).     No current facility-administered medications for this visit.    REVIEW OF SYSTEMS:   Constitutional: ( - ) fevers, ( - )  chills , ( - ) night sweats Eyes: ( - ) blurriness of vision, ( - ) double vision, ( - ) watery eyes Ears, nose, mouth, throat, and face: ( - ) mucositis, ( - ) sore throat Respiratory: ( - ) cough, ( - ) dyspnea, ( - ) wheezes Cardiovascular: ( - )  palpitation, ( - ) chest discomfort, ( - ) lower extremity swelling Gastrointestinal:  ( - )  nausea, ( - ) heartburn, ( - ) change in bowel habits Skin: ( - ) abnormal skin rashes Lymphatics: ( - ) new lymphadenopathy, ( - ) easy bruising Neurological: ( - ) numbness, ( - ) tingling, ( - ) new weaknesses Behavioral/Psych: ( - ) mood change, ( - ) new changes  All other systems were reviewed with the patient and are negative.  PHYSICAL EXAMINATION: ECOG PERFORMANCE STATUS: 1 - Symptomatic but completely ambulatory  Vitals:   03/23/22 0832  BP: 134/66  Pulse: (!) 57  Resp: 18  Temp: 97.8 F (36.6 C)  SpO2: 97%    Filed Weights   03/23/22 0832  Weight: 252 lb 1.6 oz (114.4 kg)     GENERAL: Well-appearing elderly Caucasian male, alert, no distress and comfortable SKIN: Healing rash on the right upper back.   EYES: conjunctiva are pink and non-injected, sclera clear LUNGS: clear to auscultation and percussion with normal breathing effort HEART: regular rate & rhythm and no murmurs and no lower extremity edema Musculoskeletal: no cyanosis of digits and no clubbing. Tenderness to palpation in posterior left shoulder blade.  PSYCH: alert & oriented x 3, fluent speech NEURO: no focal motor/sensory deficits  LABORATORY DATA:  I have reviewed the data as listed    Latest Ref Rng & Units 03/23/2022    8:16 AM 03/15/2022    3:04 PM 03/10/2022   10:46 PM  CBC  WBC 4.0 - 10.5 K/uL 4.6  4.9  8.3   Hemoglobin 13.0 - 17.0 g/dL 14.1  13.5  15.5   Hematocrit 39.0 - 52.0 % 41.2  39.8  45.2   Platelets 150 - 400 K/uL 161  128  193        Latest Ref Rng & Units 03/15/2022    3:04 PM 03/10/2022   10:46 PM 03/08/2022   10:47 AM  CMP  Glucose 70 - 99 mg/dL 90  133  114   BUN 8 - 23 mg/dL _0 Creatinine 0.61 - 1.24 mg/dL 0.72  0.75  0.84   Sodium 135 - 145 mmol/L 140  138  141   Potassium 3.5 - 5.1 mmol/L 3.1  3.0  3.7   Chloride 98 - 111 mmol/L 104  101  104   CO2 22 - 32  mmol/L _1 Calcium 8.9 - 10.3 mg/dL 9.0  9.2  9.0   Total Protein 6.5 - 8.1 g/dL 5.8   6.1   Total Bilirubin 0.3 - 1.2 mg/dL 0.7   0.5   Alkaline Phos 38 - 126 U/L 107   127   AST 15 - 41 U/L 18   16   ALT 0 - 44 U/L 27   22     Lab Results  Component Value Date   MPROTEIN 0.1 (H) 02/22/2022   MPROTEIN 0.1 (H) 02/02/2022   MPROTEIN 0.1 (H) 01/26/2022   Lab Results  Component Value Date   KPAFRELGTCHN 303.4 (H) 02/22/2022   KPAFRELGTCHN 359.5 (H) 02/02/2022   KPAFRELGTCHN 341.6 (H) 01/26/2022   LAMBDASER 3.0 (L) 02/22/2022   LAMBDASER 2.7 (L) 02/02/2022   LAMBDASER 2.8 (L) 01/26/2022   KAPLAMBRATIO 101.13 (H) 02/22/2022   KAPLAMBRATIO 133.15 (H) 02/02/2022   KAPLAMBRATIO 122.00 (H) 01/26/2022     RADIOGRAPHIC STUDIES: CT Angio Chest PE W and/or Wo Contrast  Result Date: 03/15/2022 CLINICAL DATA:  Short of breath, concern for pulmonary embolism Multiple myeloma. EXAM: CT ANGIOGRAPHY CHEST WITH  CONTRAST TECHNIQUE: Multidetector CT imaging of the chest was performed using the standard protocol during bolus administration of intravenous contrast. Multiplanar CT image reconstructions and MIPs were obtained to evaluate the vascular anatomy. RADIATION DOSE REDUCTION: This exam was performed according to the departmental dose-optimization program which includes automated exposure control, adjustment of the mA and/or kV according to patient size and/or use of iterative reconstruction technique. CONTRAST:  30m OMNIPAQUE IOHEXOL 350 MG/ML SOLN COMPARISON:  None Available. FINDINGS: Cardiovascular: No filling defects within the pulmonary arteries to suggest acute pulmonary embolism. Mediastinum/Nodes: No axillary or supraclavicular adenopathy. No mediastinal or hilar adenopathy. No pericardial fluid. Esophagus normal. Lungs/Pleura: No pulmonary infarction. No pneumonia. No pleural fluid. No pneumothorax Upper Abdomen: Limited view of the liver, kidneys, pancreas are unremarkable. Normal  adrenal glands. Musculoskeletal: Expansion of the posterior sixth rib (image 53/6) on the RIGHT. Myeloma. Several lucent lesions in the lower thoracic spine (image 111/6) Review of the MIP images confirms the above findings. IMPRESSION: 1. No acute pulmonary embolism. 2. No acute pulmonary parenchymal findings. 3. Lytic expansion of the posterior RIGHT sixth rib as well as several lytic lesions in the lower thoracic spine. Findings consistent with given history of multiple myeloma. Electronically Signed   By: SSuzy BouchardM.D.   On: 03/15/2022 19:00   DG Chest 2 View  Result Date: 03/10/2022 CLINICAL DATA:  Chest pain, atrial fibrillation. EXAM: CHEST - 2 VIEW COMPARISON:  None Available. FINDINGS: The heart size and mediastinal contours are within normal limits. Left basilar subsegmental linear atelectasis. Lungs otherwise clear. Partially imaged hardware in the right humerus thoracic spondylosis. IMPRESSION: Left basilar subsegmental linear atelectasis. Lungs otherwise clear. Electronically Signed   By: IKeane PoliceD.O.   On: 03/10/2022 22:36    ASSESSMENT & PLAN WTAHMID STONEHOCKER89y.o. male with medical history significant for IgA Kappa Multiple Myeloma who presents for a follow up visit.   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  He meets diagnostic criteria by having a serum free light chain ratio greater than 100 and lytic lesions causing pathological fracture of the humerus.  Given this I would recommend we pursued Darzalex, Revlimid, and dexamethasone.  Given his advanced age I would not recommend transplantation, that this is something we can consider when it comes time to transition to maintenance therapy versus transplant.  The patient voices understanding of the plan moving forward.  # IgA Kappa Multiple Myeloma --Metastatic survey completed 12/15/2021 --Patient underwent fixation of his  humerus on 12/17/2021, pathology confirmed plasmacytoma/plasma cell neoplasm.  --Cycle 1 Day 1 of  Darzalex, Revlimid, and dexamethasone started on 12/28/2021.  Plan: --labs today show white blood cell 4.6, hemoglobin 14.1, MCV 94.1, and platelets of 161 --restaging labs today with SPEP and SFLC.  On last check M protein is stable at 0.1 with continued downward trend of kappa light chains. Kappa 303.4, Lambda 3.0, Ratio 101.13. --today is Cycle 4 Day 1 of Dara/Dex/Revlimid --RTC in 2 weeks with interval weekly treatments  #Hypokalemia: --Potassium is 3.1 today. --Gave PO potassium chloride 40 mEq today in infusion.  --Currently on potassium chloride 20 mEq daily. Recommend to increase to twice daily  # Rash Concerning for Shingles- improving -- Prescribed valacyclovir 3 times daily 1000 mg x 10 days-completed the course -- We will continue to monitor rash closely.  #Left shoulder pain:  --Patient was evaluated by Ortho, Dr. VGriffin Basil and obtained xray images.  --  We will request images and determine next steps.  --Okay to continue with Tylenol and take Tramadol for breakthrough pain.   #Left Rib Pain -- X-ray of the ribs show no clear issues with the left ribs. --Pain is steadily improving. --known disease in right ribs -- Continue to monitor  #Supportive Care -- chemotherapy education complete -- port placement not required -- zofran 72m q8H PRN and compazine 177mPO q6H for nausea -- acyclovir 40041mO BID for VCZ prophylaxis -- Zometa/Xgeva treatment to start once dental clearance is obtained.  Discussed this with the patient previously. -- Pain is managed right now with tylenol  Offered stronger pain medication if pain were to worsen.    No orders of the defined types were placed in this encounter.   All questions were answered. The patient knows to call the clinic with any problems, questions or concerns.  I have spent a total of 30 minutes minutes of face-to-face and  non-face-to-face time, preparing to see the patient, performing a medically appropriate examination, counseling and educating the patient, documenting clinical information in the electronic health record, and care coordination.   IreDede Query-C Dept of Hematology and OncLem[facility]: [phone]/21/2023 8:45 AM

## 2022-03-24 ENCOUNTER — Other Ambulatory Visit: Payer: Self-pay | Admitting: *Deleted

## 2022-03-24 ENCOUNTER — Telehealth: Payer: Self-pay | Admitting: *Deleted

## 2022-03-24 ENCOUNTER — Ambulatory Visit
Admission: RE | Admit: 2022-03-24 | Discharge: 2022-03-24 | Disposition: A | Discharge status: Home / Self Care | Payer: Self-pay | Source: Ambulatory Visit | Attending: Physician Assistant | Admitting: Physician Assistant

## 2022-03-24 ENCOUNTER — Ambulatory Visit
Admission: RE | Admit: 2022-03-24 | Discharge: 2022-03-24 | Disposition: A | Discharge status: Home / Self Care | Payer: Medicare Other | Source: Ambulatory Visit | Attending: Urology | Admitting: Urology

## 2022-03-24 ENCOUNTER — Encounter: Payer: Self-pay | Admitting: *Deleted

## 2022-03-24 DIAGNOSIS — C9000 Multiple myeloma not having achieved remission: Secondary | ICD-10-CM

## 2022-03-24 DIAGNOSIS — M6281 Muscle weakness (generalized): Secondary | ICD-10-CM | POA: Diagnosis not present

## 2022-03-24 DIAGNOSIS — M25611 Stiffness of right shoulder, not elsewhere classified: Secondary | ICD-10-CM | POA: Diagnosis not present

## 2022-03-24 DIAGNOSIS — M84421D Pathological fracture, right humerus, subsequent encounter for fracture with routine healing: Secondary | ICD-10-CM | POA: Diagnosis not present

## 2022-03-24 LAB — KAPPA/LAMBDA LIGHT CHAINS
Kappa free light chain: 240 mg/L — ABNORMAL HIGH (ref 3.3–19.4)
Kappa, lambda light chain ratio: 77.42 — ABNORMAL HIGH (ref 0.26–1.65)
Lambda free light chains: 3.1 mg/L — ABNORMAL LOW (ref 5.7–26.3)

## 2022-03-24 NOTE — Telephone Encounter (Signed)
TCT patient regarding his dentist. Spoke with him. Advised that we need a letter from his dentist giving clearance for pt to take IV Zometa. He states he saw his dentist, Dr. Earley Favor, DDS about 2 months ago for a small procedure.. Will send notice to Dr, Gloriann Loan, requesting dental clearance. No other questions or concerns.

## 2022-03-25 ENCOUNTER — Other Ambulatory Visit: Payer: Self-pay | Admitting: Neurology

## 2022-03-29 LAB — MULTIPLE MYELOMA PANEL, SERUM
Albumin SerPl Elph-Mcnc: 3.5 g/dL (ref 2.9–4.4)
Albumin/Glob SerPl: 1.7 (ref 0.7–1.7)
Alpha 1: 0.3 g/dL (ref 0.0–0.4)
Alpha2 Glob SerPl Elph-Mcnc: 0.7 g/dL (ref 0.4–1.0)
B-Globulin SerPl Elph-Mcnc: 0.9 g/dL (ref 0.7–1.3)
Gamma Glob SerPl Elph-Mcnc: 0.2 g/dL — ABNORMAL LOW (ref 0.4–1.8)
Globulin, Total: 2.1 g/dL — ABNORMAL LOW (ref 2.2–3.9)
IgA: 47 mg/dL — ABNORMAL LOW (ref 61–437)
IgG (Immunoglobin G), Serum: 270 mg/dL — ABNORMAL LOW (ref 603–1613)
IgM (Immunoglobulin M), Srm: 10 mg/dL — ABNORMAL LOW (ref 20–172)
M Protein SerPl Elph-Mcnc: 0.1 g/dL — ABNORMAL HIGH
Total Protein ELP: 5.6 g/dL — ABNORMAL LOW (ref 6.0–8.5)

## 2022-03-30 DIAGNOSIS — M84421D Pathological fracture, right humerus, subsequent encounter for fracture with routine healing: Secondary | ICD-10-CM | POA: Diagnosis not present

## 2022-03-30 DIAGNOSIS — G4733 Obstructive sleep apnea (adult) (pediatric): Secondary | ICD-10-CM | POA: Diagnosis not present

## 2022-03-30 DIAGNOSIS — M25611 Stiffness of right shoulder, not elsewhere classified: Secondary | ICD-10-CM | POA: Diagnosis not present

## 2022-03-30 DIAGNOSIS — M6281 Muscle weakness (generalized): Secondary | ICD-10-CM | POA: Diagnosis not present

## 2022-03-31 ENCOUNTER — Encounter: Payer: Self-pay | Admitting: Internal Medicine

## 2022-03-31 ENCOUNTER — Ambulatory Visit: Payer: Medicare Other | Attending: Internal Medicine | Admitting: Internal Medicine

## 2022-03-31 ENCOUNTER — Telehealth: Payer: Self-pay | Admitting: Internal Medicine

## 2022-03-31 VITALS — BP 138/84 | HR 118 | Wt 246.0 lb

## 2022-03-31 DIAGNOSIS — I4891 Unspecified atrial fibrillation: Secondary | ICD-10-CM

## 2022-03-31 MED ORDER — AMIODARONE HCL 400 MG PO TABS: ORAL_TABLET | ORAL | 3 refills | Status: DC

## 2022-03-31 NOTE — Telephone Encounter (Signed)
Pt calling back to f/u on call with nurse from this morning. Please advise

## 2022-03-31 NOTE — Telephone Encounter (Signed)
Patient c/o Palpitations:  High priority if patient c/o lightheadedness, shortness of breath, or chest pain  How long have you had palpitations/irregular HR/ Afib? Are you having the symptoms now? Last night; yes   Are you currently experiencing lightheadedness, SOB or CP? No   Do you have a history of afib (atrial fibrillation) or irregular heart rhythm? Yes   Have you checked your BP or HR? (document readings if available): 159/90; 120  Are you experiencing any other symptoms? Cramping like a charley horse

## 2022-03-31 NOTE — Telephone Encounter (Signed)
I spoke with the pt and he says he has been having "cramping" at rest for several weeks under his left armpit radiating to his left collarbone... he does not have SOB or dizziness  and it happens at rest and was really bad last night keeping him up. He says he went to the ED for it twice... his wife is worried that his Myeloma is back but he says this discomfort is different. I will talk with Dr Harrington Challenger.

## 2022-03-31 NOTE — Patient Instructions (Addendum)
Medication Instructions:  STOP FLECAINIDE AND IN THREE DAYS START AMIODARONE 400 MG TWICE A DAY FOR 5 DAYS THEN ONCE A DAY THEREAFTER.    *If you need a refill on your cardiac medications before your next appointment, please call your pharmacy*   Lab Work:  If you have labs (blood work) drawn today and your tests are completely normal, you will receive your results only by: MyChart Message (if you have MyChart) OR A paper copy in the mail If you have any lab test that is abnormal or we need to change your treatment, we will call you to review the results.   Testing/Procedures:    Follow-Up: At [facility], you and your health needs are our priority.  As part of our continuing mission to provide you with exceptional heart care, we have created designated Provider Care Teams.  These Care Teams include your primary Cardiologist (physician) and Advanced Practice Providers (APPs -  Physician Assistants and Nurse Practitioners) who all work together to provide you with the care you need, when you need it.  We recommend signing up for the patient portal called "MyChart".  Sign up information is provided on this After Visit Summary.  MyChart is used to connect with patients for Virtual Visits (Telemedicine).  Patients are able to view lab/test results, encounter notes, upcoming appointments, etc.  Non-urgent messages can be sent to your provider as well.   To learn more about what you can do with MyChart, go to [email or web address].    Your next appointment:  DECEMBER 2023   REFERRAL TO DR. ZACK SMITH   Important Information About Sugar

## 2022-03-31 NOTE — Progress Notes (Signed)
a   Cardiology Office Note   Date:  03/31/2022   ID:  [name], Travis Jul 31, 1950, MRN [id number]  PCP:  [name], MD  Cardiologist:   [name], MD    Patient presents for evaluation of chest pain   and tachycardia      History of Present Illness: Travis [name] is a 71 y.o. male with a history of  HTN, OSA and PAF (CHADSVASc score 4; s/p Afib ablation 2018  On flecanide).   The pt had Cardiac CTA in 2018 which showed no CAD  Calcium score was 0  Pt also has hx of DVT, peripheral neuropathy and MGUS I saw the pt in July 2023   Earlier this fall he was in [facility]   Hospitalized for tachycardia   atrial fib/fltutter He was volume overloaded   Diuresed   Converted to SR.  B Blocker stopped as he became bradycardic   Then placed back on it.    Echo done as well  On 03/15/22 she was seen in ER with afib and atypcial CP  EKG showed SR   Trop checked neg   CT of chest showed no PE   Did show lytic lesions L sixth rib      Pt given lidocaine patch.    The pt was seen in afib clinc on 03/17/22   IN SR   Denied CP   Appt made with [name] in Jan 2024   The pt called in to clinic today    Has continued to have L sided CP under arm   Also L suprascapular pain   Not positional  Not pleuritic.   Lasts hours     He also went into afib last night   Took an additional metoprolol   Denies SOB or dizziness  Allergies:   Linezolid and Vancomycin   Past Medical History:  Diagnosis Date   Arthritis    "comes and goes; mostly in hands, occasionally elbow" (24/06/6832)   Complication of anesthesia    "just wanted to sleep alot  for couple days S/P VARICOCELE OR" (04/05/2017)   DVT (deep venous thrombosis) (HCC)    LLE   Dysrhythmia    PVCs    GERD (gastroesophageal reflux disease)    History of hiatal hernia    Hypertension    Impaired glucose tolerance    Multiple myeloma (HCC)    Obesity (BMI 30-39.9) 07/26/2016   OSA on CPAP 07/26/2016   Mild with AHI 12.5/hr now on CPAP at  14cm H2O   PAF (paroxysmal atrial fibrillation) ([place])    s/p PVI Ablation in 2018 // Flecainide Rx // Echo 8/21: EF 60-65, no RWMA, mild LVH, normal RV SF, moderate RVE, mild BAE, trivial MR, mild dilation of aortic root (40 mm)   Pneumonia    "may have had walking pneumonia a few years ago" (04/05/2017)   Pre-diabetes    Thrombophlebitis     Past Surgical History:  Procedure Laterality Date   ATRIAL FIBRILLATION ABLATION  04/05/2017   ATRIAL FIBRILLATION ABLATION N/A 04/05/2017   Procedure: ATRIAL FIBRILLATION ABLATION;  Surgeon: Thompson Grayer, MD;  Location: [facility];  Service: Cardiovascular;  Laterality: N/A;   BONE BIOPSY Right 12/17/2021   Procedure: BONE BIOPSY;  Surgeon: Hiram Gash, MD;  Location: [facility];  Service: Orthopedics;  Laterality: Right;   COMPLETE RIGHT HIP REPLACMENT  01/19/2019   EVALUATION UNDER ANESTHESIA WITH HEMORRHOIDECTOMY N/A 02/24/2017  Procedure: EXAM UNDER ANESTHESIA WITH  HEMORRHOIDECTOMY;  Surgeon: Gross, Steven, MD;  Location: [facility];  Service: General;  Laterality: N/A;   EXCISIONAL TOTAL HIP ARTHROPLASTY WITH ANTIBIOTIC SPACERS Right 09/25/2020   Procedure: EXCISIONAL TOTAL HIP ARTHROPLASTY WITH ANTIBIOTIC SPACERS;  Surgeon: Olin, Matthew, MD;  Location: [facility];  Service: Orthopedics;  Laterality: Right;   FLEXIBLE SIGMOIDOSCOPY N/A 04/15/2015   Procedure:  UNSEDATED FLEXIBLE SIGMOIDOSCOPY;  Surgeon: Martin K Johnson, MD;  Location: [facility];  Service: Endoscopy;  Laterality: N/A;   HUMERUS IM NAIL Right 12/17/2021   Procedure: INTRAMEDULLARY (IM) NAIL HUMERAL;  Surgeon: Varkey, Dax T, MD;  Location: [facility];  Service: Orthopedics;  Laterality: Right;   KNEE ARTHROSCOPY Left ~ 2016   REIMPLANTATION OF TOTAL HIP Right 12/25/2020   Procedure: REIMPLANTATION/REVISION OF RIGHT TOTAL HIP VERSUS REPEAT IRRIGATION AND DEBRIDEMENT;  Surgeon: Olin, Matthew, MD;  Location: [facility];  Service: Orthopedics;  Laterality:  Right;   TESTICLE SURGERY  1988   VARICOCELE   TONSILLECTOMY     VARICOSE VEIN SURGERY Bilateral      Social History:  The patient  reports that he has never smoked. He has never used smokeless tobacco. He reports current alcohol use of about 2.0 standard drinks of alcohol per week. He reports that he does not use drugs.   Family History:  The patient's family history includes Atrial fibrillation in his father; Dementia in his maternal grandmother and mother; Hypertension in his father and paternal grandfather; Stroke in his father.    ROS:  Please see the history of present illness. All other systems are reviewed and  Negative to the above problem except as noted.    PHYSICAL EXAM: VS:  BP 138/84   Pulse (!) 118   Wt 246 lb (111.6 kg)   SpO2 97%   BMI 35.30 kg/m   GEN: Morbidly obese 71 yo in no acute distress  HEENT: normal  Neck:  Neck ful   No definite JVD Cardiac: RRR; no murmurs Tachy  Trivial edeam Back   Tender over L scapula     Respiratory:  clear to auscultation bilaterally,   GI: Distended   No hepatomegaly   MS: no deformity Moving all extremities   Skin: warm and dry   Erythema in legs  Neuro:  Deferred  EKG:  EKG is ordered   Atrial flutter   118 bpm      Lipid Panel No results found for: "CHOL", "TRIG", "HDL", "CHOLHDL", "VLDL", "LDLCALC", "LDLDIRECT"    Wt Readings from Last 3 Encounters:  03/31/22 246 lb (111.6 kg)  03/23/22 252 lb 1.6 oz (114.4 kg)  03/17/22 252 lb 9.6 oz (114.6 kg)      ASSESSMENT AND PLAN:    Chest / back pain     Appears to be musculoskeletal   I do not think cardiac      Will refer to  [facility].   Recent CT does not show any definite abnormalitiy on L side  2  Atrial fib/flutter    Pt in atrial flutter now    Tolerating   I hae reviewed with [name]   Will stop Flecanide   In 3 days start amiodarone 400 bid for 5 days then 400 daily   I will see in December   Has appt with [name] in Jan 2024     4  Hx of Bradycardia    Will need to follow HR closely on amiodarone   5.    HTN   BP is fair   Follow        6.  Lipids.  Excellent     LDL 61, HDL  7.  OSA.   Continue CPAP  Follow up in Dec 2024  Current medicines are reviewed at length with the patient today.  The patient does not have concerns regarding medicines.  Signed, Paula Ross, MD  03/31/2022 4:16 PM    [facility] [address], [place], [place]  [address] Phone: [phone]; Fax: [phone]  

## 2022-03-31 NOTE — Telephone Encounter (Signed)
Appt made with Dr Harrington Challenger today at [facility]. I asked to have his wife drive him to the office.

## 2022-04-02 DIAGNOSIS — M25611 Stiffness of right shoulder, not elsewhere classified: Secondary | ICD-10-CM | POA: Diagnosis not present

## 2022-04-02 DIAGNOSIS — M6281 Muscle weakness (generalized): Secondary | ICD-10-CM | POA: Diagnosis not present

## 2022-04-02 DIAGNOSIS — M84421D Pathological fracture, right humerus, subsequent encounter for fracture with routine healing: Secondary | ICD-10-CM | POA: Diagnosis not present

## 2022-04-05 ENCOUNTER — Other Ambulatory Visit: Payer: Medicare Other

## 2022-04-05 DIAGNOSIS — M84421D Pathological fracture, right humerus, subsequent encounter for fracture with routine healing: Secondary | ICD-10-CM | POA: Diagnosis not present

## 2022-04-05 DIAGNOSIS — M25611 Stiffness of right shoulder, not elsewhere classified: Secondary | ICD-10-CM | POA: Diagnosis not present

## 2022-04-05 DIAGNOSIS — M6281 Muscle weakness (generalized): Secondary | ICD-10-CM | POA: Diagnosis not present

## 2022-04-06 ENCOUNTER — Other Ambulatory Visit: Payer: Self-pay

## 2022-04-06 ENCOUNTER — Inpatient Hospital Stay: Payer: Medicare Other | Attending: Hematology and Oncology

## 2022-04-06 ENCOUNTER — Inpatient Hospital Stay: Payer: Medicare Other | Admitting: Hematology and Oncology

## 2022-04-06 ENCOUNTER — Inpatient Hospital Stay: Payer: Medicare Other

## 2022-04-06 VITALS — BP 132/53 | HR 51 | Temp 98.0°F | Resp 18 | Wt 250.9 lb

## 2022-04-06 DIAGNOSIS — R21 Rash and other nonspecific skin eruption: Secondary | ICD-10-CM | POA: Diagnosis not present

## 2022-04-06 DIAGNOSIS — M25511 Pain in right shoulder: Secondary | ICD-10-CM | POA: Insufficient documentation

## 2022-04-06 DIAGNOSIS — Z8249 Family history of ischemic heart disease and other diseases of the circulatory system: Secondary | ICD-10-CM | POA: Insufficient documentation

## 2022-04-06 DIAGNOSIS — C9000 Multiple myeloma not having achieved remission: Secondary | ICD-10-CM | POA: Insufficient documentation

## 2022-04-06 DIAGNOSIS — Z5112 Encounter for antineoplastic immunotherapy: Secondary | ICD-10-CM | POA: Diagnosis not present

## 2022-04-06 DIAGNOSIS — E876 Hypokalemia: Secondary | ICD-10-CM | POA: Diagnosis not present

## 2022-04-06 DIAGNOSIS — Z7961 Long term (current) use of immunomodulator: Secondary | ICD-10-CM | POA: Insufficient documentation

## 2022-04-06 DIAGNOSIS — Z86718 Personal history of other venous thrombosis and embolism: Secondary | ICD-10-CM | POA: Insufficient documentation

## 2022-04-06 DIAGNOSIS — Z7901 Long term (current) use of anticoagulants: Secondary | ICD-10-CM | POA: Diagnosis not present

## 2022-04-06 DIAGNOSIS — R262 Difficulty in walking, not elsewhere classified: Secondary | ICD-10-CM | POA: Insufficient documentation

## 2022-04-06 DIAGNOSIS — Z823 Family history of stroke: Secondary | ICD-10-CM | POA: Diagnosis not present

## 2022-04-06 DIAGNOSIS — M79603 Pain in arm, unspecified: Secondary | ICD-10-CM | POA: Diagnosis not present

## 2022-04-06 DIAGNOSIS — Z79899 Other long term (current) drug therapy: Secondary | ICD-10-CM | POA: Diagnosis not present

## 2022-04-06 DIAGNOSIS — M25512 Pain in left shoulder: Secondary | ICD-10-CM | POA: Diagnosis not present

## 2022-04-06 DIAGNOSIS — I10 Essential (primary) hypertension: Secondary | ICD-10-CM | POA: Diagnosis not present

## 2022-04-06 DIAGNOSIS — Z818 Family history of other mental and behavioral disorders: Secondary | ICD-10-CM | POA: Insufficient documentation

## 2022-04-06 DIAGNOSIS — I480 Paroxysmal atrial fibrillation: Secondary | ICD-10-CM | POA: Insufficient documentation

## 2022-04-06 DIAGNOSIS — Z881 Allergy status to other antibiotic agents status: Secondary | ICD-10-CM | POA: Diagnosis not present

## 2022-04-06 LAB — CBC WITH DIFFERENTIAL (CANCER CENTER ONLY)
Abs Immature Granulocytes: 0.02 10*3/uL (ref 0.00–0.07)
Basophils Absolute: 0.1 10*3/uL (ref 0.0–0.1)
Basophils Relative: 1 %
Eosinophils Absolute: 0.3 10*3/uL (ref 0.0–0.5)
Eosinophils Relative: 4 %
HCT: 40.7 % (ref 39.0–52.0)
Hemoglobin: 13.9 g/dL (ref 13.0–17.0)
Immature Granulocytes: 0 %
Lymphocytes Relative: 7 %
Lymphs Abs: 0.5 10*3/uL — ABNORMAL LOW (ref 0.7–4.0)
MCH: 32 pg (ref 26.0–34.0)
MCHC: 34.2 g/dL (ref 30.0–36.0)
MCV: 93.8 fL (ref 80.0–100.0)
Monocytes Absolute: 0.6 10*3/uL (ref 0.1–1.0)
Monocytes Relative: 8 %
Neutro Abs: 5.7 10*3/uL (ref 1.7–7.7)
Neutrophils Relative %: 80 %
Platelet Count: 153 10*3/uL (ref 150–400)
RBC: 4.34 MIL/uL (ref 4.22–5.81)
RDW: 15.1 % (ref 11.5–15.5)
WBC Count: 7.2 10*3/uL (ref 4.0–10.5)
nRBC: 0 % (ref 0.0–0.2)

## 2022-04-06 LAB — CMP (CANCER CENTER ONLY)
ALT: 23 U/L (ref 0–44)
AST: 16 U/L (ref 15–41)
Albumin: 3.9 g/dL (ref 3.5–5.0)
Alkaline Phosphatase: 103 U/L (ref 38–126)
Anion gap: 6 (ref 5–15)
BUN: 16 mg/dL (ref 8–23)
CO2: 34 mmol/L — ABNORMAL HIGH (ref 22–32)
Calcium: 9.5 mg/dL (ref 8.9–10.3)
Chloride: 103 mmol/L (ref 98–111)
Creatinine: 1 mg/dL (ref 0.61–1.24)
GFR, Estimated: >60 mL/min (ref >60)
Glucose, Bld: 86 mg/dL (ref 70–99)
Potassium: 3.2 mmol/L — ABNORMAL LOW (ref 3.5–5.1)
Sodium: 143 mmol/L (ref 135–145)
Total Bilirubin: 0.5 mg/dL (ref 0.3–1.2)
Total Protein: 6 g/dL — ABNORMAL LOW (ref 6.5–8.1)

## 2022-04-06 LAB — LACTATE DEHYDROGENASE: LDH: 147 U/L (ref 98–192)

## 2022-04-06 MED ORDER — ACETAMINOPHEN 325 MG PO TABS
650.0000 mg | ORAL_TABLET | Freq: Once | ORAL | Status: AC
  Administered 2022-04-06: 650 mg via ORAL
  Filled 2022-04-06: qty 2

## 2022-04-06 MED ORDER — DIPHENHYDRAMINE HCL 25 MG PO CAPS
50.0000 mg | ORAL_CAPSULE | Freq: Once | ORAL | Status: AC
  Administered 2022-04-06: 50 mg via ORAL
  Filled 2022-04-06: qty 2

## 2022-04-06 MED ORDER — DARATUMUMAB-HYALURONIDASE-FIHJ 1800-30000 MG-UT/15ML ~~LOC~~ SOLN
1800.0000 mg | Freq: Once | SUBCUTANEOUS | Status: AC
  Administered 2022-04-06: 1800 mg via SUBCUTANEOUS
  Filled 2022-04-06: qty 15

## 2022-04-06 NOTE — Patient Instructions (Signed)
[name]  Discharge Instructions: Thank you for choosing [facility] to provide your oncology and hematology care.   If you have a lab appointment with the [facility], please go directly to the [facility] and check in at the registration area.   Wear comfortable clothing and clothing appropriate for easy access to any Portacath or PICC line.   We strive to give you quality time with your provider. You may need to reschedule your appointment if you arrive late (15 or more minutes).  Arriving late affects you and other patients whose appointments are after yours.  Also, if you miss three or more appointments without notifying the office, you may be dismissed from the clinic at the provider's discretion.      For prescription refill requests, have your pharmacy contact our office and allow 72 hours for refills to be completed.    Today you received the following chemotherapy and/or immunotherapy agents: Darzalex Faspro      To help prevent nausea and vomiting after your treatment, we encourage you to take your nausea medication as directed.  BELOW ARE SYMPTOMS THAT SHOULD BE REPORTED IMMEDIATELY: *FEVER GREATER THAN 100.4 F (38 C) OR HIGHER *CHILLS OR SWEATING *NAUSEA AND VOMITING THAT IS NOT CONTROLLED WITH YOUR NAUSEA MEDICATION *UNUSUAL SHORTNESS OF BREATH *UNUSUAL BRUISING OR BLEEDING *URINARY PROBLEMS (pain or burning when urinating, or frequent urination) *BOWEL PROBLEMS (unusual diarrhea, constipation, pain near the anus) TENDERNESS IN MOUTH AND THROAT WITH OR WITHOUT PRESENCE OF ULCERS (sore throat, sores in mouth, or a toothache) UNUSUAL RASH, SWELLING OR PAIN  UNUSUAL VAGINAL DISCHARGE OR ITCHING   Items with * indicate a potential emergency and should be followed up as soon as possible or go to the Emergency Department if any problems should occur.  Please show the CHEMOTHERAPY ALERT CARD or IMMUNOTHERAPY ALERT CARD at  check-in to the Emergency Department and triage nurse.  Should you have questions after your visit or need to cancel or reschedule your appointment, please contact [facility]  Dept: [phone]  and follow the prompts.  Office hours are 8:00 a.m. to 4:30 p.m. Monday - Friday. Please note that voicemails left after 4:00 p.m. may not be returned until the following business day.  We are closed weekends and major holidays. You have access to a nurse at all times for urgent questions. Please call the main number to the clinic Dept: [phone] and follow the prompts.   For any non-urgent questions, you may also contact your provider using MyChart. We now offer e-Visits for anyone 68 and older to request care online for non-urgent symptoms. For details visit [email or web address].   Also download the MyChart app! Go to the app store, search "MyChart", open the app, select [facility], and log in with your MyChart username and password.  Masks are optional in the cancer centers. If you would like for your care team to wear a mask while they are taking care of you, please let them know. You may have one support person who is at least 71 years old accompany you for your appointments.

## 2022-04-06 NOTE — Progress Notes (Signed)
Pt states took dex at home before treatment today

## 2022-04-06 NOTE — Progress Notes (Signed)
[facility] [phone]   [phone]  PROGRESS NOTE  Patient Care Team: [name], MD as PCP - General (Internal Medicine) Fay Records, MD as PCP - Cardiology (Cardiology) Sueanne Margarita, MD as PCP - Sleep Medicine (Cardiology) Thompson Grayer, MD as PCP - Electrophysiology (Cardiology) Michael Boston, MD as Consulting Physician (General Surgery) Clarene Essex, MD as Consulting Physician (Gastroenterology) Sueanne Margarita, MD as Consulting Physician (Sleep Medicine) Alda Berthold, DO as Consulting Physician (Neurology)  Hematological/Oncological History # IgA Kappa Multiple Myeloma 05/05/2020: Bone marrow biopsy showed increased number of plasma cells representing 14% of all cells in the aspirate associated with numerous variably sized clusters in the clot and biopsy sections 04/10/2021: M protein 0.3, IgA kappa specificity. Kappa 580.5, Labda 7.0, ratio 82.93.   12/07/2021: Labs show of 1971.3, lambda 5.5, ratio 358.42 12/10/2021: Transition care to Dr. Lorenso Courier. 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  01/26/2022: Cycle 2 Day 1 of Dara/Rev/Dex 02/22/2022: Cycle 3 Day 1 of Dara/Rev/Dex 03/23/2022: Cycle 4 Day 1 of Dara/Rev/Dex 04/20/2022: anticipated Cycle 5 Day 1 of Dara/Rev/Dex  Interval History:  Travis [name] 71 y.o. male with medical history significant for IgA Kappa Multiple Myeloma who presents for a follow up visit. The patient's last visit was on 03/23/2022. In the interim since the last visit he has continued of Dara/Rev/Dex.  On exam today Travis [name] is accompanied by his wife.  He reports he had a good Thanksgiving dinner.  He was just him and his wife and they ordered them from [employer].  Get plenty of leftovers.  He reports that his treatments are going quite fine.  He does still have some pain in his  left shoulder blade and left side.  He reports that he did have some recent x-rays which showed a "shadow on the shoulder blade".  He notes his energy levels are quite good but does decrease throughout the week.  He reports that he does not have enough energy to try and get on the bicycle.  He reports he does have a lot of itching and rash on his lower extremities but is not painful.  He denies any fevers, chills, sweats, shortness of breath, chest pain.  A full 10 point ROS is listed below.  Overall he is willing and able to proceed with treatment today.  MEDICAL HISTORY:  Past Medical History:  Diagnosis Date   Arthritis    "comes and goes; mostly in hands, occasionally elbow" (84/10/9627)   Complication of anesthesia    "just wanted to sleep alot  for couple days S/P VARICOCELE OR" (04/05/2017)   DVT (deep venous thrombosis) (HCC)    LLE   Dysrhythmia    PVCs    GERD (gastroesophageal reflux disease)    History of hiatal hernia    Hypertension    Impaired glucose tolerance    Multiple myeloma (HCC)    Obesity (BMI 30-39.9) 07/26/2016   OSA on CPAP 07/26/2016   Mild with AHI 12.5/hr now on CPAP at 14cm H2O   PAF (paroxysmal atrial fibrillation) ([facility])    s/p PVI Ablation in 2018 // Flecainide Rx // Echo 8/21: EF 60-65, no RWMA, mild LVH, normal RV SF, moderate RVE, mild BAE, trivial MR, mild dilation of aortic root (40 mm)   Pneumonia    "may have had walking pneumonia a few years ago" (04/05/2017)   Pre-diabetes  Thrombophlebitis     SURGICAL HISTORY: Past Surgical History:  Procedure Laterality Date   ATRIAL FIBRILLATION ABLATION  04/05/2017   ATRIAL FIBRILLATION ABLATION N/A 04/05/2017   Procedure: ATRIAL FIBRILLATION ABLATION;  Surgeon: Thompson Grayer, MD;  Location: [facility];  Service: Cardiovascular;  Laterality: N/A;   BONE BIOPSY Right 12/17/2021   Procedure: BONE BIOPSY;  Surgeon: Hiram Gash, MD;  Location: [facility];  Service: Orthopedics;   Laterality: Right;   COMPLETE RIGHT HIP REPLACMENT  01/19/2019   EVALUATION UNDER ANESTHESIA WITH HEMORRHOIDECTOMY N/A 02/24/2017   Procedure: EXAM UNDER ANESTHESIA WITH  HEMORRHOIDECTOMY;  Surgeon: Michael Boston, MD;  Location: [facility];  Service: General;  Laterality: N/A;   EXCISIONAL TOTAL HIP ARTHROPLASTY WITH ANTIBIOTIC SPACERS Right 09/25/2020   Procedure: EXCISIONAL TOTAL HIP ARTHROPLASTY WITH ANTIBIOTIC SPACERS;  Surgeon: Paralee Cancel, MD;  Location: [facility];  Service: Orthopedics;  Laterality: Right;   FLEXIBLE SIGMOIDOSCOPY N/A 04/15/2015   Procedure:  UNSEDATED FLEXIBLE SIGMOIDOSCOPY;  Surgeon: [name], MD;  Location: [facility];  Service: Endoscopy;  Laterality: N/A;   HUMERUS IM NAIL Right 12/17/2021   Procedure: INTRAMEDULLARY (IM) NAIL HUMERAL;  Surgeon: Hiram Gash, MD;  Location: [facility];  Service: Orthopedics;  Laterality: Right;   KNEE ARTHROSCOPY Left ~ 2016   REIMPLANTATION OF TOTAL HIP Right 12/25/2020   Procedure: REIMPLANTATION/REVISION OF RIGHT TOTAL HIP VERSUS REPEAT IRRIGATION AND DEBRIDEMENT;  Surgeon: Paralee Cancel, MD;  Location: [facility];  Service: Orthopedics;  Laterality: Right;   TESTICLE SURGERY  1988   VARICOCELE   TONSILLECTOMY     VARICOSE VEIN SURGERY Bilateral     SOCIAL HISTORY: Social History   Socioeconomic History   Marital status: Married    Spouse name: Not on file   Number of children: 1   Years of education: law school   Highest education level: Not on file  Occupational History   Occupation: lawyer  Tobacco Use   Smoking status: Never   Smokeless tobacco: Never  Vaping Use   Vaping Use: Never used  Substance and Sexual Activity   Alcohol use: Yes    Alcohol/week: 2.0 standard drinks of alcohol    Types: 2 Glasses of wine per week    Comment: 2 glasses of wine a week, occ bourbon, beer   Drug use: No   Sexual activity: Not Currently  Other Topics Concern   Not on file  Social History Narrative    Lives in [place] with spouse in a 2 story home.  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    Social Determinants of Health   Financial Resource Strain: Not on file  Food Insecurity: Not on file  Transportation Needs: Not on file  Physical Activity: Not on file  Stress: Not on file  Social Connections: Not on file  Intimate Partner Violence: Not on file    FAMILY HISTORY: Family History  Problem Relation Age of Onset   Dementia Mother    Stroke Father    Atrial fibrillation Father    Hypertension Father    Hypertension Paternal Grandfather    Dementia Maternal Grandmother     ALLERGIES:  is allergic to linezolid and vancomycin.  MEDICATIONS:  Current Outpatient Medications  Medication Sig Dispense Refill   acetaminophen (TYLENOL) 325 MG tablet Take 2 tablets (650 mg total) by mouth every 6 (six) hours as needed. 36 tablet  0   acyclovir (ZOVIRAX) 400 MG tablet Take 1 tablet (400 mg total) by mouth 2 (two) times daily. 180 tablet 3   amiodarone (PACERONE) 400 MG tablet TAKE ONE TABLET (400 MG) TWICE A DAY FOR 5 DAYS THEN ONE TABLET ONCE DAILY THEREAFTER. 90 tablet 3   amLODipine (NORVASC) 10 MG tablet Take 10 mg by mouth daily.      apixaban (ELIQUIS) 5 MG TABS tablet TAKE ONE TABLET BY MOUTH TWICE A DAY 60 tablet 6   Ascorbic Acid (VITAMIN C) 100 MG tablet Take 100 mg by mouth daily.     cholecalciferol (VITAMIN D3) 25 MCG (1000 UNIT) tablet Take 1,000 Units by mouth daily.     clobetasol cream (TEMOVATE) 4.03 % Apply 1 Application topically daily as needed (rash).     Cyanocobalamin (VITAMIN B-12 PO) Take 1 tablet by mouth daily. Not sure the dosage     dexamethasone (DECADRON) 4 MG tablet Take 10 tablets (40 mg total) by mouth once a week. (Patient taking differently: Take 40 mg by mouth every 14 (fourteen) days.) 40 tablet 5   fluticasone (FLONASE) 50 MCG/ACT nasal spray Place 1 spray into both nostrils daily as  needed for allergies or rhinitis.     folic acid (FOLVITE) 1 MG tablet Take 1 mg by mouth daily.     furosemide (LASIX) 40 MG tablet Take 40 mg by mouth daily as needed for fluid or edema.     gabapentin (NEURONTIN) 300 MG capsule TAKE TWO CAPSULES BY MOUTH TWICE A DAY 360 capsule 1   hydrALAZINE (APRESOLINE) 10 MG tablet Take 1 tablet (10 mg total) by mouth 2 (two) times daily. AS NEEDED FOR HIGH BP 180 tablet 3   hydrochlorothiazide (HYDRODIURIL) 25 MG tablet TAKE ONE TABLET BY MOUTH ONE TIME DAILY 90 tablet 3   lenalidomide (REVLIMID) 25 MG capsule Take 1 capsule (25 mg total) by mouth daily. Take 21 days on, 7 days off, repeat every 28 days. [employer] Josem Kaufmann #[id number] Date obtained: 03/12/22 21 capsule 0   lidocaine (LIDODERM) 5 % Place 1 patch onto the skin daily as needed. Remove & Discard patch within 12 hours or as directed by MD 15 patch 0   lisinopril (ZESTRIL) 40 MG tablet TAKE ONE TABLET BY MOUTH ONE TIME DAILY 90 tablet 3   oxymetazoline (AFRIN) 0.05 % nasal spray Place 1 spray into both nostrils 2 (two) times daily as needed for congestion.     prochlorperazine (COMPAZINE) 10 MG tablet Take 1 tablet (10 mg total) by mouth every 6 (six) hours as needed for nausea or vomiting. 30 tablet 0   psyllium (METAMUCIL) 58.6 % packet Take 1 packet by mouth daily as needed (constipation).     sodium chloride (OCEAN) 0.65 % SOLN nasal spray Place 1 spray into both nostrils as needed for congestion.     triamcinolone cream (KENALOG) 0.1 % Apply 1 application topically daily as needed (itching).     potassium chloride SA (KLOR-CON M) 20 MEQ tablet TAKE ONE TABLET BY MOUTH ONE TIME DAILY, TAKE 40 MG (2 TABLETS) ON DAYS THAT YOU ALSO TAKE LASIX (FUROSEMIDE) (Patient taking differently: Take 30 mEq by mouth daily.) 135 tablet 3   No current facility-administered medications for this visit.    REVIEW OF SYSTEMS:   Constitutional: ( - ) fevers, ( - )  chills , ( - ) night sweats Eyes: ( - ) blurriness of  vision, ( - ) double vision, ( - ) watery eyes Ears,  nose, mouth, throat, and face: ( - ) mucositis, ( - ) sore throat Respiratory: ( - ) cough, ( - ) dyspnea, ( - ) wheezes Cardiovascular: ( - ) palpitation, ( - ) chest discomfort, ( - ) lower extremity swelling Gastrointestinal:  ( - ) nausea, ( - ) heartburn, ( - ) change in bowel habits Skin: ( - ) abnormal skin rashes Lymphatics: ( - ) new lymphadenopathy, ( - ) easy bruising Neurological: ( - ) numbness, ( - ) tingling, ( - ) new weaknesses Behavioral/Psych: ( - ) mood change, ( - ) new changes  All other systems were reviewed with the patient and are negative.  PHYSICAL EXAMINATION: ECOG PERFORMANCE STATUS: 1 - Symptomatic but completely ambulatory  Vitals:   04/06/22 1015  BP: (!) 132/53  Pulse: (!) 51  Resp: 18  Temp: 98 F (36.7 C)  SpO2: 96%    Filed Weights   04/06/22 1015  Weight: 250 lb 14.4 oz (113.8 kg)     GENERAL: Well-appearing elderly Caucasian male, alert, no distress and comfortable SKIN: Rash on lower extremities bilaterally. EYES: conjunctiva are pink and non-injected, sclera clear LUNGS: clear to auscultation and percussion with normal breathing effort HEART: regular rate & rhythm and no murmurs and no lower extremity edema Musculoskeletal: no cyanosis of digits and no clubbing  PSYCH: alert & oriented x 3, fluent speech NEURO: no focal motor/sensory deficits  LABORATORY DATA:  I have reviewed the data as listed    Latest Ref Rng & Units 04/06/2022   10:06 AM 03/23/2022    8:16 AM 03/15/2022    3:04 PM  CBC  WBC 4.0 - 10.5 K/uL 7.2  4.6  4.9   Hemoglobin 13.0 - 17.0 g/dL 13.9  14.1  13.5   Hematocrit 39.0 - 52.0 % 40.7  41.2  39.8   Platelets 150 - 400 K/uL 153  161  128        Latest Ref Rng & Units 04/06/2022   10:06 AM 03/23/2022    8:16 AM 03/15/2022    3:04 PM  CMP  Glucose 70 - 99 mg/dL 86  104  90   BUN 8 - 23 mg/dL _0 Creatinine 0.61 - 1.24 mg/dL 1.00  0.79  0.72    Sodium 135 - 145 mmol/L 143  141  140   Potassium 3.5 - 5.1 mmol/L 3.2  3.1  3.1   Chloride 98 - 111 mmol/L 103  102  104   CO2 22 - 32 mmol/L 34  34  28   Calcium 8.9 - 10.3 mg/dL 9.5  9.3  9.0   Total Protein 6.5 - 8.1 g/dL 6.0  6.0  5.8   Total Bilirubin 0.3 - 1.2 mg/dL 0.5  0.5  0.7   Alkaline Phos 38 - 126 U/L 103  122  107   AST 15 - 41 U/L _1 ALT 0 - 44 U/L _2 Lab Results  Component Value Date   MPROTEIN 0.1 (H) 03/23/2022   MPROTEIN 0.1 (H) 02/22/2022   MPROTEIN 0.1 (H) 02/02/2022   Lab Results  Component Value Date   KPAFRELGTCHN 240.0 (H) 03/23/2022   KPAFRELGTCHN 303.4 (H) 02/22/2022   KPAFRELGTCHN 359.5 (H) 02/02/2022   LAMBDASER 3.1 (L) 03/23/2022   LAMBDASER 3.0 (L) 02/22/2022   LAMBDASER 2.7 (L) 02/02/2022   KAPLAMBRATIO 77.42 (H) 03/23/2022   KAPLAMBRATIO 101.13 (  H) 02/22/2022   KAPLAMBRATIO 133.15 (H) 02/02/2022     RADIOGRAPHIC STUDIES: CT Angio Chest PE W and/or Wo Contrast  Result Date: 03/15/2022 CLINICAL DATA:  Short of breath, concern for pulmonary embolism Multiple myeloma. EXAM: CT ANGIOGRAPHY CHEST WITH CONTRAST TECHNIQUE: Multidetector CT imaging of the chest was performed using the standard protocol during bolus administration of intravenous contrast. Multiplanar CT image reconstructions and MIPs were obtained to evaluate the vascular anatomy. RADIATION DOSE REDUCTION: This exam was performed according to the departmental dose-optimization program which includes automated exposure control, adjustment of the mA and/or kV according to patient size and/or use of iterative reconstruction technique. CONTRAST:  77m OMNIPAQUE IOHEXOL 350 MG/ML SOLN COMPARISON:  None Available. FINDINGS: Cardiovascular: No filling defects within the pulmonary arteries to suggest acute pulmonary embolism. Mediastinum/Nodes: No axillary or supraclavicular adenopathy. No mediastinal or hilar adenopathy. No pericardial fluid. Esophagus normal. Lungs/Pleura:  No pulmonary infarction. No pneumonia. No pleural fluid. No pneumothorax Upper Abdomen: Limited view of the liver, kidneys, pancreas are unremarkable. Normal adrenal glands. Musculoskeletal: Expansion of the posterior sixth rib (image 53/6) on the RIGHT. Myeloma. Several lucent lesions in the lower thoracic spine (image 111/6) Review of the MIP images confirms the above findings. IMPRESSION: 1. No acute pulmonary embolism. 2. No acute pulmonary parenchymal findings. 3. Lytic expansion of the posterior RIGHT sixth rib as well as several lytic lesions in the lower thoracic spine. Findings consistent with given history of multiple myeloma. Electronically Signed   By: SSuzy BouchardM.D.   On: 03/15/2022 19:00   DG Chest 2 View  Result Date: 03/10/2022 CLINICAL DATA:  Chest pain, atrial fibrillation. EXAM: CHEST - 2 VIEW COMPARISON:  None Available. FINDINGS: The heart size and mediastinal contours are within normal limits. Left basilar subsegmental linear atelectasis. Lungs otherwise clear. Partially imaged hardware in the right humerus thoracic spondylosis. IMPRESSION: Left basilar subsegmental linear atelectasis. Lungs otherwise clear. Electronically Signed   By: IKeane PoliceD.O.   On: 03/10/2022 22:36    ASSESSMENT & PLAN Travis OAXACA89y.o. male with medical history significant for IgA Kappa Multiple Myeloma who presents for a follow up visit.   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  He meets diagnostic criteria by having a serum free light chain ratio greater than 100 and lytic lesions causing pathological fracture of the humerus.  Given this I would recommend we pursued Darzalex, Revlimid, and dexamethasone.  Given his advanced age I would not recommend transplantation, that this is something we can consider when it comes time to transition to maintenance therapy versus transplant.  The patient  voices understanding of the plan moving forward.  # IgA Kappa Multiple Myeloma --Metastatic survey completed 12/15/2021 --Patient underwent fixation of his humerus on 12/17/2021, pathology confirmed plasmacytoma/plasma cell neoplasm.  --Cycle 1 Day 1 of  Darzalex, Revlimid, and dexamethasone started on 12/28/2021.  Plan: --labs today show white blood cell 7.2, hemoglobin 13.9, MCV 93.8, and platelets of 153 --restaging labs today with SPEP and SFLC.  On last check M protein is stable at 0.1 with continued downward trend of kappa light chains. --today is Cycle 4 Day 15 of Dara/Dex/Revlimid --RTC in 2 weeks with interval weekly treatments  # Rash Concerning for Shingles- improving -- Prescribed valacyclovir 3 times daily 1000 mg x 10 days-completed the course -- We will continue to monitor rash closely.  #Left Rib Pain -- X-ray of the  ribs show no clear issues with the left ribs. --Pain is steadily improving. --known disease in right ribs -- Continue to monitor  #Supportive Care -- chemotherapy education complete -- port placement not required -- zofran 62m q8H PRN and compazine 145mPO q6H for nausea -- acyclovir 40019mO BID for VCZ prophylaxis -- Xgeva treatment to start, dental clearance has been obtained. Anticipate first dose at next visit.  -- Pain is managed right now with tylenol  Offered stronger pain medication if pain were to worsen.    Orders Placed This Encounter  Procedures   DG Bone Survey Met    Standing Status:   Future    Standing Expiration Date:   04/07/2023    Order Specific Question:   Reason for Exam (SYMPTOM  OR DIAGNOSIS REQUIRED)    Answer:   continue bone pain, assess for worsening lytic lesions    Order Specific Question:   Preferred imaging location?    Answer[facility] All questions were answered. The patient knows to call the clinic with any problems, questions or concerns.  A total of more than 30 minutes were spent on this  encounter with face-to-face time and non-face-to-face time, including preparing to see the patient, ordering tests and/or medications, counseling the patient and coordination of care as outlined above.   JohLedell PeoplesD Department of Hematology/Oncology [place] [facility]: [phone]: [phone]: [email or web address]  04/06/2022 2:10 PM

## 2022-04-07 ENCOUNTER — Other Ambulatory Visit: Payer: Self-pay | Admitting: Hematology and Oncology

## 2022-04-08 ENCOUNTER — Other Ambulatory Visit: Payer: Self-pay | Admitting: *Deleted

## 2022-04-08 DIAGNOSIS — M84421D Pathological fracture, right humerus, subsequent encounter for fracture with routine healing: Secondary | ICD-10-CM | POA: Diagnosis not present

## 2022-04-08 DIAGNOSIS — M25611 Stiffness of right shoulder, not elsewhere classified: Secondary | ICD-10-CM | POA: Diagnosis not present

## 2022-04-08 DIAGNOSIS — M6281 Muscle weakness (generalized): Secondary | ICD-10-CM | POA: Diagnosis not present

## 2022-04-08 DIAGNOSIS — C9000 Multiple myeloma not having achieved remission: Secondary | ICD-10-CM

## 2022-04-08 MED ORDER — LENALIDOMIDE 25 MG PO CAPS: 25.0000 mg | ORAL_CAPSULE | Freq: Every day | ORAL | 0 refills | Status: DC

## 2022-04-09 ENCOUNTER — Telehealth: Payer: Self-pay | Admitting: Internal Medicine

## 2022-04-09 ENCOUNTER — Other Ambulatory Visit: Payer: Self-pay | Admitting: Internal Medicine

## 2022-04-09 NOTE — Telephone Encounter (Signed)
Prescription refill request for Eliquis received. Indication:Afib Last office visit:11/23 Scr:1.0 Age: 71 Weight:113.8  kg  Prescription refilled

## 2022-04-09 NOTE — Telephone Encounter (Signed)
Spoke with patient regarding message left by [employer] loss adjuster. HR was 48 and patient has been taking metoprolol but not on his current medication list.  Reviewed current med list with patient and verified that metoprolol was not listed currently. Advised patient to stop taking it and that I would forward to Dr Harrington Challenger for review.   Patient verbalized understanding.

## 2022-04-09 NOTE — Telephone Encounter (Signed)
I spoke with pt  Pt has had a few PVCs   No afib HR 48    BP   120-130/50  Stop metoprolol   Keep on amiodarone   Just went down to daily on Wednesday Has an appt on 12/18

## 2022-04-09 NOTE — Telephone Encounter (Signed)
Pt c/o medication issue:  1. Name of Medication: metoprolol   2. How are you currently taking this medication (dosage and times per day)? Twice a day   3. Are you having a reaction (difficulty breathing--STAT)? No   4. What is your medication issue?    Morene Antu NP - insurance company calling, they visits pt once a year and today she took the pt's vital signs and HR is at 48 and pt said he is taking metoprolol twice a day but its not on his medication list. Morene Antu wants to verify of pt does need to take that medication. She said to call pt directly since she is about to leave his house

## 2022-04-12 ENCOUNTER — Encounter: Payer: Self-pay | Admitting: Hematology and Oncology

## 2022-04-12 ENCOUNTER — Other Ambulatory Visit: Payer: Self-pay | Admitting: *Deleted

## 2022-04-12 ENCOUNTER — Ambulatory Visit: Payer: Medicare Other | Admitting: Hematology and Oncology

## 2022-04-12 DIAGNOSIS — C9000 Multiple myeloma not having achieved remission: Secondary | ICD-10-CM

## 2022-04-12 NOTE — Telephone Encounter (Signed)
Pt has 04/20/22 appt with Dr Harrington Challenger.

## 2022-04-15 ENCOUNTER — Ambulatory Visit (HOSPITAL_COMMUNITY)
Admission: RE | Admit: 2022-04-15 | Discharge: 2022-04-15 | Disposition: A | Discharge status: Home / Self Care | Payer: Medicare Other | Source: Ambulatory Visit | Attending: Hematology and Oncology | Admitting: Hematology and Oncology

## 2022-04-15 DIAGNOSIS — C9000 Multiple myeloma not having achieved remission: Secondary | ICD-10-CM

## 2022-04-16 DIAGNOSIS — M6281 Muscle weakness (generalized): Secondary | ICD-10-CM | POA: Diagnosis not present

## 2022-04-16 DIAGNOSIS — M25611 Stiffness of right shoulder, not elsewhere classified: Secondary | ICD-10-CM | POA: Diagnosis not present

## 2022-04-16 DIAGNOSIS — M84421D Pathological fracture, right humerus, subsequent encounter for fracture with routine healing: Secondary | ICD-10-CM | POA: Diagnosis not present

## 2022-04-17 NOTE — Progress Notes (Unsigned)
a   Cardiology Office Note   Date:  04/21/2022   ID:  Travis, [name] Dec 29, 1950, MRN [id number]  PCP:  Travis Frames, MD  Cardiologist:   Travis Carnes, MD    Patient presents for evaluation of chest pain   and tachycardia      History of Present Illness: Travis [name] is a 71 y.o. male with a history of  HTN, OSA and PAF (CHADSVASc score 4; s/p Afib ablation 2018  On flecanide).   The pt had Cardiac CTA in 2018 which showed no CAD  Calcium score was 0  Pt also has hx of DVT, peripheral neuropathy and MGUS I saw the pt in July 2023   Earlier this fall he was in [facility]   Hospitalized for tachycardia   atrial fib/fltutter He was volume overloaded   Diuresed   Converted to SR.  B Blocker stopped as he became bradycardic   Then placed back on it.    Echo done as well  On 03/15/22 she was seen in ER with afib and atypcial CP  EKG showed SR   Trop checked neg   CT of chest showed no PE   Did show lytic lesions L sixth rib      Pt given lidocaine patch.    The pt was seen in afib clinc on 03/17/22   IN SR   Denied CP   Appt made with [name] in Jan 2024  I saw the pt a few wks ago   He wsas back in atrial flutter     Took an additional metoprolol without help   I recomm stopping the flecanide   Then, starting amiodarone 400 bid for 5 days going to 400 mg daily. Cut back on metoprolol to 1/2  Since seen he says palpitations /tachcardia has resolved   His breathing is OK   No dzziness NOw complains of R sided  shoulder/arm pain     He has also noticed some cramping in L chest      Allergies:   Linezolid and Vancomycin   Past Medical History:  Diagnosis Date   Arthritis    "comes and goes; mostly in hands, occasionally elbow" (30/12/6576)   Complication of anesthesia    "just wanted to sleep alot  for couple days S/P VARICOCELE OR" (04/05/2017)   DVT (deep venous thrombosis) (HCC)    LLE   Dysrhythmia    PVCs    GERD (gastroesophageal reflux disease)    History of hiatal  hernia    Hypertension    Impaired glucose tolerance    Multiple myeloma (HCC)    Obesity (BMI 30-39.9) 07/26/2016   OSA on CPAP 07/26/2016   Mild with AHI 12.5/hr now on CPAP at 14cm H2O   PAF (paroxysmal atrial fibrillation) ([place])    s/p PVI Ablation in 2018 // Flecainide Rx // Echo 8/21: EF 60-65, no RWMA, mild LVH, normal RV SF, moderate RVE, mild BAE, trivial MR, mild dilation of aortic root (40 mm)   Pneumonia    "may have had walking pneumonia a few years ago" (04/05/2017)   Pre-diabetes    Thrombophlebitis     Past Surgical History:  Procedure Laterality Date   ATRIAL FIBRILLATION ABLATION  04/05/2017   ATRIAL FIBRILLATION ABLATION N/A 04/05/2017   Procedure: ATRIAL FIBRILLATION ABLATION;  Surgeon: Thompson Grayer, MD;  Location: [facility];  Service: Cardiovascular;  Laterality: N/A;   BONE BIOPSY Right 12/17/2021   Procedure: BONE  BIOPSY;  Surgeon: Hiram Gash, MD;  Location: [place];  Service: Orthopedics;  Laterality: Right;   COMPLETE RIGHT HIP REPLACMENT  01/19/2019   EVALUATION UNDER ANESTHESIA WITH HEMORRHOIDECTOMY N/A 02/24/2017   Procedure: EXAM UNDER ANESTHESIA WITH  HEMORRHOIDECTOMY;  Surgeon: Michael Boston, MD;  Location: [facility];  Service: General;  Laterality: N/A;   EXCISIONAL TOTAL HIP ARTHROPLASTY WITH ANTIBIOTIC SPACERS Right 09/25/2020   Procedure: EXCISIONAL TOTAL HIP ARTHROPLASTY WITH ANTIBIOTIC SPACERS;  Surgeon: Paralee Cancel, MD;  Location: [facility];  Service: Orthopedics;  Laterality: Right;   FLEXIBLE SIGMOIDOSCOPY N/A 04/15/2015   Procedure:  UNSEDATED FLEXIBLE SIGMOIDOSCOPY;  Surgeon: [name], MD;  Location: [facility];  Service: Endoscopy;  Laterality: N/A;   HUMERUS IM NAIL Right 12/17/2021   Procedure: INTRAMEDULLARY (IM) NAIL HUMERAL;  Surgeon: Hiram Gash, MD;  Location: [facility];  Service: Orthopedics;  Laterality: Right;   KNEE ARTHROSCOPY Left ~ 2016   REIMPLANTATION OF TOTAL HIP Right  12/25/2020   Procedure: REIMPLANTATION/REVISION OF RIGHT TOTAL HIP VERSUS REPEAT IRRIGATION AND DEBRIDEMENT;  Surgeon: Paralee Cancel, MD;  Location: [facility];  Service: Orthopedics;  Laterality: Right;   TESTICLE SURGERY  1988   VARICOCELE   TONSILLECTOMY     VARICOSE VEIN SURGERY Bilateral      Social History:  The patient  reports that he has never smoked. He has never used smokeless tobacco. He reports current alcohol use of about 2.0 standard drinks of alcohol per week. He reports that he does not use drugs.   Family History:  The patient's family history includes Atrial fibrillation in his father; Dementia in his maternal grandmother and mother; Hypertension in his father and paternal grandfather; Stroke in his father.    ROS:  Please see the history of present illness. All other systems are reviewed and  Negative to the above problem except as noted.    PHYSICAL EXAM: VS:  BP (!) 162/78 (BP Location: Left Arm, Patient Position: Sitting, Cuff Size: Normal)   Pulse (!) 53   Wt 254 lb (115.2 kg)   SpO2 94%   BMI 36.45 kg/m   GEN: Obese 71 yo in no acute distress  HEENT: normal  Neck:  Neck ful   No JVD Cardiac: RRR; no murmurs Tachy  Triv LE edema    Respiratory:  clear to auscultation bilaterally,   GI: Distended   No hepatomegaly   MS: no deformity Moving all extremities   Skin: warm and dry   Erythema in legs  Neuro:  Deferred  EKG:  EKG is ordered today   SInus bradycardia  54 bpm   QTc 498   Lipid Panel No results found for: "CHOL", "TRIG", "HDL", "CHOLHDL", "VLDL", "LDLCALC", "LDLDIRECT"    Wt Readings from Last 3 Encounters:  04/20/22 254 lb (115.2 kg)  04/20/22 254 lb 3.2 oz (115.3 kg)  04/06/22 250 lb 14.4 oz (113.8 kg)      ASSESSMENT AND PLAN:   Atrial flutter/fib.  PT is currently in [name]   WIll need to follow HR closely with hx of bradycardia    Stop metoprolol completely  Cut back amiodarone to 285m daily     Follow     He has appt with  [name] Jan 2024  2  CP   Atypical   Appears musculoskeletal   Some of cramping probably from low K  3  HTN  Stop hydrodiuril   given persistently low  K levels  Give K today and tomorrow .  Check BMET and MG on Friday    Pat follow BP at home   If in 140s 150s can take hydralazine 10 mg bid or tid     Keep on other meds   4  Hx of Bradycardia   Will need to follow HR closely on amiodarone    6.  Lipids.  Excellent     LDL 61, HDL  7.  OSA.   Continue CPAP  Follow up in Jan with EP   Current medicines are reviewed at length with the patient today.  The patient does not have concerns regarding medicines.  Signed, Travis Carnes, MD  04/21/2022 12:00 PM    [address], [place], [place]  [address] Phone: [phone]; Fax: [phone]

## 2022-04-19 ENCOUNTER — Telehealth: Payer: Self-pay | Admitting: *Deleted

## 2022-04-19 DIAGNOSIS — M6281 Muscle weakness (generalized): Secondary | ICD-10-CM | POA: Diagnosis not present

## 2022-04-19 DIAGNOSIS — M25611 Stiffness of right shoulder, not elsewhere classified: Secondary | ICD-10-CM | POA: Diagnosis not present

## 2022-04-19 DIAGNOSIS — M84421D Pathological fracture, right humerus, subsequent encounter for fracture with routine healing: Secondary | ICD-10-CM | POA: Diagnosis not present

## 2022-04-19 NOTE — Telephone Encounter (Signed)
TCT pt regarding results of recent bone survey. Spoke with [name] and advised that he has no new skeletal  lesions. The ones he does have are stable. Advised that his current treatment regime has stabilized his disease.  Pt voiced understanding. He is aware of his clinic appts tomorrow.

## 2022-04-19 NOTE — Telephone Encounter (Signed)
-----   Message from Orson Slick, MD sent at 04/17/2022  8:50 AM EST ----- Please let Travis [name] know that his bone survey looks good.  There is no evidence of a rib lesion or lesion on the shoulder.  There appear to be no new spots compared to our prior scan and none of the spots are larger.  It appears as though the therapy has stabilized the disease and there are no new issues with the skeleton.  ----- Message ----- From: Interface, Rad Results In Sent: 04/17/2022  12:26 AM EST To: Orson Slick, MD

## 2022-04-20 ENCOUNTER — Ambulatory Visit: Payer: Medicare Other | Attending: Internal Medicine | Admitting: Internal Medicine

## 2022-04-20 ENCOUNTER — Inpatient Hospital Stay: Payer: Medicare Other

## 2022-04-20 ENCOUNTER — Other Ambulatory Visit: Payer: Self-pay

## 2022-04-20 ENCOUNTER — Encounter: Payer: Self-pay | Admitting: Internal Medicine

## 2022-04-20 ENCOUNTER — Inpatient Hospital Stay (HOSPITAL_BASED_OUTPATIENT_CLINIC_OR_DEPARTMENT_OTHER): Payer: Medicare Other | Admitting: Hematology and Oncology

## 2022-04-20 VITALS — BP 160/71 | HR 55 | Temp 97.5°F | Resp 17 | Wt 254.2 lb

## 2022-04-20 VITALS — BP 162/78 | HR 53 | Wt 254.0 lb

## 2022-04-20 DIAGNOSIS — Z79899 Other long term (current) drug therapy: Secondary | ICD-10-CM | POA: Diagnosis not present

## 2022-04-20 DIAGNOSIS — R262 Difficulty in walking, not elsewhere classified: Secondary | ICD-10-CM | POA: Diagnosis not present

## 2022-04-20 DIAGNOSIS — Z8249 Family history of ischemic heart disease and other diseases of the circulatory system: Secondary | ICD-10-CM | POA: Diagnosis not present

## 2022-04-20 DIAGNOSIS — Z86718 Personal history of other venous thrombosis and embolism: Secondary | ICD-10-CM | POA: Diagnosis not present

## 2022-04-20 DIAGNOSIS — I10 Essential (primary) hypertension: Secondary | ICD-10-CM | POA: Diagnosis not present

## 2022-04-20 DIAGNOSIS — M25511 Pain in right shoulder: Secondary | ICD-10-CM | POA: Diagnosis not present

## 2022-04-20 DIAGNOSIS — C9000 Multiple myeloma not having achieved remission: Secondary | ICD-10-CM

## 2022-04-20 DIAGNOSIS — Z823 Family history of stroke: Secondary | ICD-10-CM | POA: Diagnosis not present

## 2022-04-20 DIAGNOSIS — M79603 Pain in arm, unspecified: Secondary | ICD-10-CM | POA: Diagnosis not present

## 2022-04-20 DIAGNOSIS — R21 Rash and other nonspecific skin eruption: Secondary | ICD-10-CM | POA: Diagnosis not present

## 2022-04-20 DIAGNOSIS — M25512 Pain in left shoulder: Secondary | ICD-10-CM | POA: Diagnosis not present

## 2022-04-20 DIAGNOSIS — Z7901 Long term (current) use of anticoagulants: Secondary | ICD-10-CM | POA: Diagnosis not present

## 2022-04-20 DIAGNOSIS — Z881 Allergy status to other antibiotic agents status: Secondary | ICD-10-CM | POA: Diagnosis not present

## 2022-04-20 DIAGNOSIS — I480 Paroxysmal atrial fibrillation: Secondary | ICD-10-CM | POA: Diagnosis not present

## 2022-04-20 DIAGNOSIS — Z5112 Encounter for antineoplastic immunotherapy: Secondary | ICD-10-CM | POA: Diagnosis not present

## 2022-04-20 DIAGNOSIS — Z7961 Long term (current) use of immunomodulator: Secondary | ICD-10-CM | POA: Diagnosis not present

## 2022-04-20 LAB — CMP (CANCER CENTER ONLY)
ALT: 24 U/L (ref 0–44)
AST: 21 U/L (ref 15–41)
Albumin: 4.1 g/dL (ref 3.5–5.0)
Alkaline Phosphatase: 112 U/L (ref 38–126)
Anion gap: 9 (ref 5–15)
BUN: 13 mg/dL (ref 8–23)
CO2: 31 mmol/L (ref 22–32)
Calcium: 9.2 mg/dL (ref 8.9–10.3)
Chloride: 103 mmol/L (ref 98–111)
Creatinine: 0.82 mg/dL (ref 0.61–1.24)
GFR, Estimated: >60 mL/min (ref >60)
Glucose, Bld: 87 mg/dL (ref 70–99)
Potassium: 2.9 mmol/L — ABNORMAL LOW (ref 3.5–5.1)
Sodium: 143 mmol/L (ref 135–145)
Total Bilirubin: 0.6 mg/dL (ref 0.3–1.2)
Total Protein: 7 g/dL (ref 6.5–8.1)

## 2022-04-20 LAB — CBC WITH DIFFERENTIAL (CANCER CENTER ONLY)
Abs Immature Granulocytes: 0.02 10*3/uL (ref 0.00–0.07)
Basophils Absolute: 0 10*3/uL (ref 0.0–0.1)
Basophils Relative: 1 %
Eosinophils Absolute: 0.4 10*3/uL (ref 0.0–0.5)
Eosinophils Relative: 8 %
HCT: 41.1 % (ref 39.0–52.0)
Hemoglobin: 14.3 g/dL (ref 13.0–17.0)
Immature Granulocytes: 0 %
Lymphocytes Relative: 8 %
Lymphs Abs: 0.4 10*3/uL — ABNORMAL LOW (ref 0.7–4.0)
MCH: 32.1 pg (ref 26.0–34.0)
MCHC: 34.8 g/dL (ref 30.0–36.0)
MCV: 92.2 fL (ref 80.0–100.0)
Monocytes Absolute: 0.7 10*3/uL (ref 0.1–1.0)
Monocytes Relative: 13 %
Neutro Abs: 3.6 10*3/uL (ref 1.7–7.7)
Neutrophils Relative %: 70 %
Platelet Count: 166 10*3/uL (ref 150–400)
RBC: 4.46 MIL/uL (ref 4.22–5.81)
RDW: 14.6 % (ref 11.5–15.5)
WBC Count: 5.1 10*3/uL (ref 4.0–10.5)
nRBC: 0 % (ref 0.0–0.2)

## 2022-04-20 LAB — LACTATE DEHYDROGENASE: LDH: 139 U/L (ref 98–192)

## 2022-04-20 MED ORDER — DIPHENHYDRAMINE HCL 25 MG PO CAPS
50.0000 mg | ORAL_CAPSULE | Freq: Once | ORAL | Status: AC
  Administered 2022-04-20: 50 mg via ORAL
  Filled 2022-04-20: qty 2

## 2022-04-20 MED ORDER — DARATUMUMAB-HYALURONIDASE-FIHJ 1800-30000 MG-UT/15ML ~~LOC~~ SOLN
1800.0000 mg | Freq: Once | SUBCUTANEOUS | Status: AC
  Administered 2022-04-20: 1800 mg via SUBCUTANEOUS
  Filled 2022-04-20: qty 15

## 2022-04-20 MED ORDER — HYDRALAZINE HCL 10 MG PO TABS: 10.0000 mg | ORAL_TABLET | Freq: Three times a day (TID) | ORAL | 3 refills | Status: DC

## 2022-04-20 MED ORDER — ACETAMINOPHEN 325 MG PO TABS
650.0000 mg | ORAL_TABLET | Freq: Once | ORAL | Status: AC
  Administered 2022-04-20: 650 mg via ORAL
  Filled 2022-04-20: qty 2

## 2022-04-20 MED ORDER — HYDRALAZINE HCL 10 MG PO TABS: ORAL_TABLET | ORAL | 3 refills | Status: DC

## 2022-04-20 NOTE — Progress Notes (Signed)
Pt took dexamethasone at home PTA for infusion today

## 2022-04-20 NOTE — Patient Instructions (Addendum)
Medication Instructions:  STOP HCTZ STOP METOPROLOL TAKE POTASSIUM 40 MEQ NOW, TONIGHT, TOMORROW THEN STOP  INCREASE  HYDRALAZINE 10 MG THREE TIMES A DAY FOR BP GREATER THAN 150 ON TOP   *If you need a refill on your cardiac medications before your next appointment, please call your pharmacy*   Lab Work: CMET AND MAG THIS FRIDAY  If you have labs (blood work) drawn today and your tests are completely normal, you will receive your results only by: MyChart Message (if you have MyChart) OR A paper copy in the mail If you have any lab test that is abnormal or we need to change your treatment, we will call you to review the results.   Testing/Procedures:    Follow-Up: At [facility], you and your health needs are our priority.  As part of our continuing mission to provide you with exceptional heart care, we have created designated Provider Care Teams.  These Care Teams include your primary Cardiologist (physician) and Advanced Practice Providers (APPs -  Physician Assistants and Nurse Practitioners) who all work together to provide you with the care you need, when you need it.  We recommend signing up for the patient portal called "MyChart".  Sign up information is provided on this After Visit Summary.  MyChart is used to connect with patients for Virtual Visits (Telemedicine).  Patients are able to view lab/test results, encounter notes, upcoming appointments, etc.  Non-urgent messages can be sent to your provider as well.   To learn more about what you can do with MyChart, go to [email or web address].     Important Information About Sugar

## 2022-04-20 NOTE — Progress Notes (Signed)
[place] [phone]   [phone]  PROGRESS NOTE  Patient Care Team: [name], MD as PCP - General (Internal Medicine) Fay Records, MD as PCP - Cardiology (Cardiology) Sueanne Margarita, MD as PCP - Sleep Medicine (Cardiology) Thompson Grayer, MD as PCP - Electrophysiology (Cardiology) Michael Boston, MD as Consulting Physician (General Surgery) Clarene Essex, MD as Consulting Physician (Gastroenterology) Sueanne Margarita, MD as Consulting Physician (Sleep Medicine) Alda Berthold, DO as Consulting Physician (Neurology)  Hematological/Oncological History # IgA Kappa Multiple Myeloma 05/05/2020: Bone marrow biopsy showed increased number of plasma cells representing 14% of all cells in the aspirate associated with numerous variably sized clusters in the clot and biopsy sections 04/10/2021: M protein 0.3, IgA kappa specificity. Kappa 580.5, Labda 7.0, ratio 82.93.   12/07/2021: Labs show of 1971.3, lambda 5.5, ratio 358.42 12/10/2021: Transition care to Dr. Lorenso Courier. 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  01/26/2022: Cycle 2 Day 1 of Dara/Rev/Dex 02/22/2022: Cycle 3 Day 1 of Dara/Rev/Dex 03/23/2022: Cycle 4 Day 1 of Dara/Rev/Dex 04/20/2022: Cycle 5 Day 1 of Dara/Rev/Dex  Interval History:  Travis [name] 71 y.o. male with medical history significant for IgA Kappa Multiple Myeloma who presents for a follow up visit. The patient's last visit was on 04/06/2022. In the interim since the last visit he has continued of Dara/Rev/Dex.  On exam today Travis [name] is accompanied by his wife.  He reports continues to have pain and discomfort with his upper extremities.  He reports that he is undergoing some stretches but they are not helping much.  He also reports that the muscle relaxers and pain medications are not helping him.  He  notes that the whole area of his right shoulder is quite painful.  He is also having difficulty turning his neck.  He notes that he does have trouble walking and riding his bicycle and has not been doing much cardio exercise.  He does take tramadol at night which makes him feel more comfortable and sleep well.  He is tolerating the Darzalex well and his only major side effect is fatigue.  His Revlimid therapy has been arriving on time and he has been tolerating it without any difficulty.  He denies any fevers, chills, sweats, shortness of breath, chest pain.  A full 10 point ROS is listed below.  Overall he is willing and able to proceed with treatment today.  MEDICAL HISTORY:  Past Medical History:  Diagnosis Date   Arthritis    "comes and goes; mostly in hands, occasionally elbow" (27/06/5364)   Complication of anesthesia    "just wanted to sleep alot  for couple days S/P VARICOCELE OR" (04/05/2017)   DVT (deep venous thrombosis) (HCC)    LLE   Dysrhythmia    PVCs    GERD (gastroesophageal reflux disease)    History of hiatal hernia    Hypertension    Impaired glucose tolerance    Multiple myeloma (HCC)    Obesity (BMI 30-39.9) 07/26/2016   OSA on CPAP 07/26/2016   Mild with AHI 12.5/hr now on CPAP at 14cm H2O   PAF (paroxysmal atrial fibrillation) ([place])    s/p PVI Ablation in 2018 // Flecainide Rx // Echo 8/21: EF 60-65, no RWMA, mild LVH, normal RV SF, moderate RVE, mild BAE, trivial MR, mild dilation of aortic root (40 mm)   Pneumonia    "may  have had walking pneumonia a few years ago" (04/05/2017)   Pre-diabetes    Thrombophlebitis     SURGICAL HISTORY: Past Surgical History:  Procedure Laterality Date   ATRIAL FIBRILLATION ABLATION  04/05/2017   ATRIAL FIBRILLATION ABLATION N/A 04/05/2017   Procedure: ATRIAL FIBRILLATION ABLATION;  Surgeon: Thompson Grayer, MD;  Location: [facility];  Service: Cardiovascular;  Laterality: N/A;   BONE BIOPSY Right 12/17/2021   Procedure:  BONE BIOPSY;  Surgeon: Hiram Gash, MD;  Location: [facility];  Service: Orthopedics;  Laterality: Right;   COMPLETE RIGHT HIP REPLACMENT  01/19/2019   EVALUATION UNDER ANESTHESIA WITH HEMORRHOIDECTOMY N/A 02/24/2017   Procedure: EXAM UNDER ANESTHESIA WITH  HEMORRHOIDECTOMY;  Surgeon: Michael Boston, MD;  Location: [facility];  Service: General;  Laterality: N/A;   EXCISIONAL TOTAL HIP ARTHROPLASTY WITH ANTIBIOTIC SPACERS Right 09/25/2020   Procedure: EXCISIONAL TOTAL HIP ARTHROPLASTY WITH ANTIBIOTIC SPACERS;  Surgeon: Paralee Cancel, MD;  Location: [facility];  Service: Orthopedics;  Laterality: Right;   FLEXIBLE SIGMOIDOSCOPY N/A 04/15/2015   Procedure:  UNSEDATED FLEXIBLE SIGMOIDOSCOPY;  Surgeon: [name], MD;  Location: [facility];  Service: Endoscopy;  Laterality: N/A;   HUMERUS IM NAIL Right 12/17/2021   Procedure: INTRAMEDULLARY (IM) NAIL HUMERAL;  Surgeon: Hiram Gash, MD;  Location: [facility];  Service: Orthopedics;  Laterality: Right;   KNEE ARTHROSCOPY Left ~ 2016   REIMPLANTATION OF TOTAL HIP Right 12/25/2020   Procedure: REIMPLANTATION/REVISION OF RIGHT TOTAL HIP VERSUS REPEAT IRRIGATION AND DEBRIDEMENT;  Surgeon: Paralee Cancel, MD;  Location: [facility];  Service: Orthopedics;  Laterality: Right;   TESTICLE SURGERY  1988   VARICOCELE   TONSILLECTOMY     VARICOSE VEIN SURGERY Bilateral     SOCIAL HISTORY: Social History   Socioeconomic History   Marital status: Married    Spouse name: Not on file   Number of children: 1   Years of education: law school   Highest education level: Not on file  Occupational History   Occupation: lawyer  Tobacco Use   Smoking status: Never   Smokeless tobacco: Never  Vaping Use   Vaping Use: Never used  Substance and Sexual Activity   Alcohol use: Yes    Alcohol/week: 2.0 standard drinks of alcohol    Types: 2 Glasses of wine per week    Comment: 2 glasses of wine a week, occ bourbon, beer   Drug use: No    Sexual activity: Not Currently  Other Topics Concern   Not on file  Social History Narrative   Lives in [place] with spouse in a 2 story home.  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    Social Determinants of Health   Financial Resource Strain: Not on file  Food Insecurity: Not on file  Transportation Needs: Not on file  Physical Activity: Not on file  Stress: Not on file  Social Connections: Not on file  Intimate Partner Violence: Not on file    FAMILY HISTORY: Family History  Problem Relation Age of Onset   Dementia Mother    Stroke Father    Atrial fibrillation Father    Hypertension Father    Hypertension Paternal Grandfather    Dementia Maternal Grandmother     ALLERGIES:  is allergic to linezolid and vancomycin.  MEDICATIONS:  Current Outpatient Medications  Medication Sig Dispense Refill   acetaminophen (TYLENOL) 325 MG tablet Take  2 tablets (650 mg total) by mouth every 6 (six) hours as needed. 36 tablet 0   acyclovir (ZOVIRAX) 400 MG tablet Take 1 tablet (400 mg total) by mouth 2 (two) times daily. 180 tablet 3   amiodarone (PACERONE) 400 MG tablet TAKE ONE TABLET (400 MG) TWICE A DAY FOR 5 DAYS THEN ONE TABLET ONCE DAILY THEREAFTER. 90 tablet 3   amLODipine (NORVASC) 10 MG tablet Take 10 mg by mouth daily.      Ascorbic Acid (VITAMIN C) 100 MG tablet Take 100 mg by mouth daily.     cholecalciferol (VITAMIN D3) 25 MCG (1000 UNIT) tablet Take 1,000 Units by mouth daily.     clobetasol cream (TEMOVATE) 9.50 % Apply 1 Application topically daily as needed (rash).     Cyanocobalamin (VITAMIN B-12 PO) Take 1 tablet by mouth daily. Not sure the dosage     dexamethasone (DECADRON) 4 MG tablet Take 10 tablets (40 mg total) by mouth once a week. (Patient taking differently: Take 40 mg by mouth every 14 (fourteen) days.) 40 tablet 5   ELIQUIS 5 MG TABS tablet TAKE ONE TABLET BY MOUTH TWICE A DAY 60 tablet 6    fluticasone (FLONASE) 50 MCG/ACT nasal spray Place 1 spray into both nostrils daily as needed for allergies or rhinitis.     folic acid (FOLVITE) 1 MG tablet Take 1 mg by mouth daily.     furosemide (LASIX) 40 MG tablet Take 40 mg by mouth daily as needed for fluid or edema.     gabapentin (NEURONTIN) 300 MG capsule TAKE TWO CAPSULES BY MOUTH TWICE A DAY 360 capsule 1   lenalidomide (REVLIMID) 25 MG capsule Take 1 capsule (25 mg total) by mouth daily. Take 21 days on, 7 days off, repeat every 28 days. [employer] auth # [id number] Date obtained: 04/08/22 21 capsule 0   lidocaine (LIDODERM) 5 % Place 1 patch onto the skin daily as needed. Remove & Discard patch within 12 hours or as directed by MD 15 patch 0   lisinopril (ZESTRIL) 40 MG tablet TAKE ONE TABLET BY MOUTH ONE TIME DAILY 90 tablet 3   oxymetazoline (AFRIN) 0.05 % nasal spray Place 1 spray into both nostrils 2 (two) times daily as needed for congestion.     potassium chloride SA (KLOR-CON M) 20 MEQ tablet TAKE ONE TABLET BY MOUTH ONE TIME DAILY, TAKE 40 MG (2 TABLETS) ON DAYS THAT YOU ALSO TAKE LASIX (FUROSEMIDE) (Patient taking differently: Take 30 mEq by mouth daily.) 135 tablet 3   prochlorperazine (COMPAZINE) 10 MG tablet Take 1 tablet (10 mg total) by mouth every 6 (six) hours as needed for nausea or vomiting. 30 tablet 0   psyllium (METAMUCIL) 58.6 % packet Take 1 packet by mouth daily as needed (constipation).     sodium chloride (OCEAN) 0.65 % SOLN nasal spray Place 1 spray into both nostrils as needed for congestion.     triamcinolone cream (KENALOG) 0.1 % Apply 1 application topically daily as needed (itching).     hydrALAZINE (APRESOLINE) 10 MG tablet Take one tablet (10 mg) by mouth up to three times a day for BP above 150 and 160. 270 tablet 3   No current facility-administered medications for this visit.    REVIEW OF SYSTEMS:   Constitutional: ( - ) fevers, ( - )  chills , ( - ) night sweats Eyes: ( - ) blurriness of vision, ( -  ) double vision, ( - ) watery eyes Ears, nose,  mouth, throat, and face: ( - ) mucositis, ( - ) sore throat Respiratory: ( - ) cough, ( - ) dyspnea, ( - ) wheezes Cardiovascular: ( - ) palpitation, ( - ) chest discomfort, ( - ) lower extremity swelling Gastrointestinal:  ( - ) nausea, ( - ) heartburn, ( - ) change in bowel habits Skin: ( - ) abnormal skin rashes Lymphatics: ( - ) new lymphadenopathy, ( - ) easy bruising Neurological: ( - ) numbness, ( - ) tingling, ( - ) new weaknesses Behavioral/Psych: ( - ) mood change, ( - ) new changes  All other systems were reviewed with the patient and are negative.  PHYSICAL EXAMINATION: ECOG PERFORMANCE STATUS: 1 - Symptomatic but completely ambulatory  Vitals:   04/20/22 1001  BP: (!) 160/71  Pulse: (!) 55  Resp: 17  Temp: (!) 97.5 F (36.4 C)  SpO2: 96%    Filed Weights   04/20/22 1001  Weight: 254 lb 3.2 oz (115.3 kg)     GENERAL: Well-appearing elderly Caucasian male, alert, no distress and comfortable SKIN: Rash on lower extremities bilaterally. EYES: conjunctiva are pink and non-injected, sclera clear LUNGS: clear to auscultation and percussion with normal breathing effort HEART: regular rate & rhythm and no murmurs and no lower extremity edema Musculoskeletal: no cyanosis of digits and no clubbing  PSYCH: alert & oriented x 3, fluent speech NEURO: no focal motor/sensory deficits  LABORATORY DATA:  I have reviewed the data as listed    Latest Ref Rng & Units 04/20/2022    9:34 AM 04/06/2022   10:06 AM 03/23/2022    8:16 AM  CBC  WBC 4.0 - 10.5 K/uL 5.1  7.2  4.6   Hemoglobin 13.0 - 17.0 g/dL 14.3  13.9  14.1   Hematocrit 39.0 - 52.0 % 41.1  40.7  41.2   Platelets 150 - 400 K/uL 166  153  161        Latest Ref Rng & Units 04/20/2022    9:34 AM 04/06/2022   10:06 AM 03/23/2022    8:16 AM  CMP  Glucose 70 - 99 mg/dL 87  86  104   BUN 8 - 23 mg/dL _0 Creatinine 0.61 - 1.24 mg/dL 0.82  1.00  0.79   Sodium  135 - 145 mmol/L 143  143  141   Potassium 3.5 - 5.1 mmol/L 2.9  3.2  3.1   Chloride 98 - 111 mmol/L 103  103  102   CO2 22 - 32 mmol/L 31  34  34   Calcium 8.9 - 10.3 mg/dL 9.2  9.5  9.3   Total Protein 6.5 - 8.1 g/dL 7.0  6.0  6.0   Total Bilirubin 0.3 - 1.2 mg/dL 0.6  0.5  0.5   Alkaline Phos 38 - 126 U/L 112  103  122   AST 15 - 41 U/L _1 ALT 0 - 44 U/L _2 Lab Results  Component Value Date   MPROTEIN 0.1 (H) 03/23/2022   MPROTEIN 0.1 (H) 02/22/2022   MPROTEIN 0.1 (H) 02/02/2022   Lab Results  Component Value Date   KPAFRELGTCHN 240.0 (H) 03/23/2022   KPAFRELGTCHN 303.4 (H) 02/22/2022   KPAFRELGTCHN 359.5 (H) 02/02/2022   LAMBDASER 3.1 (L) 03/23/2022   LAMBDASER 3.0 (L) 02/22/2022   LAMBDASER 2.7 (L) 02/02/2022   KAPLAMBRATIO 77.42 (H) 03/23/2022   KAPLAMBRATIO 101.13 (  H) 02/22/2022   KAPLAMBRATIO 133.15 (H) 02/02/2022     RADIOGRAPHIC STUDIES: DG Bone Survey Met  Result Date: 04/17/2022 CLINICAL DATA:  Bone pain, multiple myeloma. EXAM: METASTATIC BONE SURVEY COMPARISON:  Bone survey 12/15/2021 FINDINGS: There is a new right humeral intramedullary nail crossing a pathologic fracture in the proximal humerus. Lytic lesions in the bilateral humeri, skull, right radius have not significantly changed. No new focal lesions. No new fractures are seen. Right hip arthroplasty again noted. Multilevel degenerative changes affect the spine. IMPRESSION: 1. Lytic lesions in the bilateral humeri, skull, right radius have not significantly changed. No new lesions identified. 2. New right humeral intramedullary nail crossing a pathologic fracture in the proximal humerus. Electronically Signed   By: Ronney Asters M.D.   On: 04/17/2022 00:23    ASSESSMENT & PLAN Travis [name] 71 y.o. male with medical history significant for IgA Kappa Multiple Myeloma who presents for a follow up visit.   At this time findings are most consistent with an IgA kappa smoldering  multiple myeloma which has transitioned into a true multiple myeloma.  He meets diagnostic criteria by having a serum free light chain ratio greater than 100 and lytic lesions causing pathological fracture of the humerus.  Given this I would recommend we pursued Darzalex, Revlimid, and dexamethasone.  Given his advanced age I would not recommend transplantation, that this is something we can consider when it comes time to transition to maintenance therapy versus transplant.  The patient voices understanding of the plan moving forward.  # IgA Kappa Multiple Myeloma --Metastatic survey completed 12/15/2021 --Patient underwent fixation of his humerus on 12/17/2021, pathology confirmed plasmacytoma/plasma cell neoplasm.  --Cycle 1 Day 1 of  Darzalex, Revlimid, and dexamethasone started on 12/28/2021.  Plan: --labs today show white blood cell 5.1, hemoglobin 14.3, MCV 92.2, and platelets of 166 --restaging labs today with SPEP and SFLC.  On last check M protein is stable at 0.1 with continued downward trend of kappa light chains. Last SFLC showed Kappa 240, Lambda 3.1, Ratio 77.42 --today is Cycle 5 Day 1 of Dara/Dex/Revlimid --RTC in 2 weeks with interval q 2 week treatments  # Rash Concerning for Shingles- improving -- Prescribed valacyclovir 3 times daily 1000 mg x 10 days-completed the course -- We will continue to monitor rash closely.  #Left Rib Pain # Right Shoulder Pain/Stiffness -- X-ray of the ribs show no clear issues with the left ribs. --Pain is steadily improving. --known disease in right ribs --Will make referral to physical therapy per patient request. -- Continue to monitor  #Supportive Care -- chemotherapy education complete -- port placement not required -- zofran 17m q8H PRN and compazine 163mPO q6H for nausea -- acyclovir 4001mO BID for VCZ prophylaxis -- Xgeva treatment to start, dental clearance has been obtained. Anticipate first dose at next visit.  -- Pain is  managed right now with tylenol  Offered stronger pain medication if pain were to worsen.    Orders Placed This Encounter  Procedures   Multiple Myeloma Panel (SPEP&IFE w/QIG)    Standing Status:   Future    Standing Expiration Date:   06/15/2023   Kappa/lambda light chains    Standing Status:   Future    Standing Expiration Date:   06/15/2023   CMP ([facility])    Standing Status:   Future    Standing Expiration Date:   06/16/2023   Lactate dehydrogenase (LDH)    Standing Status:   Future  Standing Expiration Date:   06/15/2023   CBC with Differential (Cancer Center Only)    Standing Status:   Future    Standing Expiration Date:   06/16/2023    All questions were answered. The patient knows to call the clinic with any problems, questions or concerns.  A total of more than 30 minutes were spent on this encounter with face-to-face time and non-face-to-face time, including preparing to see the patient, ordering tests and/or medications, counseling the patient and coordination of care as outlined above.   Ledell Peoples, MD Department of Hematology/Oncology [place] [facility] Phone: [phone] Pager: [phone] Email: [email or web address]  04/20/2022 3:03 PM

## 2022-04-21 ENCOUNTER — Telehealth: Payer: Self-pay

## 2022-04-21 LAB — KAPPA/LAMBDA LIGHT CHAINS
Kappa free light chain: 287.6 mg/L — ABNORMAL HIGH (ref 3.3–19.4)
Kappa, lambda light chain ratio: 92.77 — ABNORMAL HIGH (ref 0.26–1.65)
Lambda free light chains: 3.1 mg/L — ABNORMAL LOW (ref 5.7–26.3)

## 2022-04-21 NOTE — Telephone Encounter (Signed)
Pt advised per Dr Harrington Challenger to cut his Amiodarone back to 200 mg daily.

## 2022-04-22 DIAGNOSIS — M6281 Muscle weakness (generalized): Secondary | ICD-10-CM | POA: Diagnosis not present

## 2022-04-22 DIAGNOSIS — M84421D Pathological fracture, right humerus, subsequent encounter for fracture with routine healing: Secondary | ICD-10-CM | POA: Diagnosis not present

## 2022-04-22 DIAGNOSIS — M25611 Stiffness of right shoulder, not elsewhere classified: Secondary | ICD-10-CM | POA: Diagnosis not present

## 2022-04-23 ENCOUNTER — Telehealth: Payer: Self-pay | Admitting: *Deleted

## 2022-04-23 ENCOUNTER — Ambulatory Visit: Payer: Medicare Other | Attending: Internal Medicine

## 2022-04-23 ENCOUNTER — Other Ambulatory Visit: Payer: Self-pay | Admitting: *Deleted

## 2022-04-23 ENCOUNTER — Encounter: Payer: Self-pay | Admitting: Internal Medicine

## 2022-04-23 DIAGNOSIS — C9000 Multiple myeloma not having achieved remission: Secondary | ICD-10-CM

## 2022-04-23 DIAGNOSIS — I10 Essential (primary) hypertension: Secondary | ICD-10-CM | POA: Diagnosis not present

## 2022-04-23 MED ORDER — AMIODARONE HCL 200 MG PO TABS: ORAL_TABLET | ORAL | 1 refills | Status: DC

## 2022-04-23 NOTE — Telephone Encounter (Signed)
Pt sent a MyChart message this afternoon reporting he is back in At Fib with a HR between 124 and 139 bpm.  BP 173/93.  He is asking if he should restart his Metoprolol that had preciously been d/ced d/t bradycardia.  Advised I will attempt to contact Dr Harrington Challenger and call back with further instructions.

## 2022-04-23 NOTE — Telephone Encounter (Signed)
Take metoprolol today Yes Go back up on amiodarone to 200 bid per Dr Harrington Challenger.  Spoke with pt regarding above order.  He states understanding and will continue to monitor BP/HR throughout the weekend.  He is aware to call MD on call over the weekend if needed.  He does not need a new RX for either medication at this time.

## 2022-04-24 ENCOUNTER — Encounter: Payer: Self-pay | Admitting: Hematology and Oncology

## 2022-04-24 LAB — COMPREHENSIVE METABOLIC PANEL
ALT: 27 IU/L (ref 0–44)
AST: 19 IU/L (ref 0–40)
Albumin/Globulin Ratio: 2.4 — ABNORMAL HIGH (ref 1.2–2.2)
Albumin: 4.1 g/dL (ref 3.9–4.9)
Alkaline Phosphatase: 137 IU/L — ABNORMAL HIGH (ref 44–121)
BUN/Creatinine Ratio: 19 (ref 10–24)
BUN: 14 mg/dL (ref 8–27)
Bilirubin Total: 0.3 mg/dL (ref 0.0–1.2)
CO2: 27 mmol/L (ref 20–29)
Calcium: 9 mg/dL (ref 8.6–10.2)
Chloride: 99 mmol/L (ref 96–106)
Creatinine, Ser: 0.75 mg/dL — ABNORMAL LOW (ref 0.76–1.27)
Globulin, Total: 1.7 g/dL (ref 1.5–4.5)
Glucose: 102 mg/dL — ABNORMAL HIGH (ref 70–99)
Potassium: 3.5 mmol/L (ref 3.5–5.2)
Sodium: 141 mmol/L (ref 134–144)
Total Protein: 5.8 g/dL — ABNORMAL LOW (ref 6.0–8.5)
eGFR: 97 mL/min/{1.73_m2} (ref >59)

## 2022-04-24 LAB — MAGNESIUM: Magnesium: 1.8 mg/dL (ref 1.6–2.3)

## 2022-04-27 ENCOUNTER — Telehealth: Payer: Self-pay | Admitting: *Deleted

## 2022-04-27 LAB — MULTIPLE MYELOMA PANEL, SERUM
Albumin SerPl Elph-Mcnc: 3.3 g/dL (ref 2.9–4.4)
Albumin/Glob SerPl: 1.6 (ref 0.7–1.7)
Alpha 1: 0.3 g/dL (ref 0.0–0.4)
Alpha2 Glob SerPl Elph-Mcnc: 0.7 g/dL (ref 0.4–1.0)
B-Globulin SerPl Elph-Mcnc: 0.8 g/dL (ref 0.7–1.3)
Gamma Glob SerPl Elph-Mcnc: 0.3 g/dL — ABNORMAL LOW (ref 0.4–1.8)
Globulin, Total: 2.2 g/dL (ref 2.2–3.9)
IgA: 52 mg/dL — ABNORMAL LOW (ref 61–437)
IgG (Immunoglobin G), Serum: 277 mg/dL — ABNORMAL LOW (ref 603–1613)
IgM (Immunoglobulin M), Srm: 10 mg/dL — ABNORMAL LOW (ref 20–172)
M Protein SerPl Elph-Mcnc: 0.1 g/dL — ABNORMAL HIGH
Total Protein ELP: 5.5 g/dL — ABNORMAL LOW (ref 6.0–8.5)

## 2022-04-27 NOTE — Telephone Encounter (Signed)
Patient called asking what his PET scan would be looking for tomorrow.  He states that he has intense stabbing pain in the shoulder blade area and radiates to the upper spine.  He has tried Oxycodone, Hydrocodone and methocarbamol and none of them has assisted in resolving the pain.  He has also tried a heating pad.  He wasn't sure if the PET scan would pick up the source of this type of pain or if he needs to have an additional scan to find the source of the pain.    Routing to Dr Lorenso Courier to please advise how to proceed.

## 2022-04-27 NOTE — Telephone Encounter (Signed)
Per Dr Lorenso Courier patient does not need PET scan.  He ordered Bone Met Survey already to determine if there was an oncologic reason for the bone pain he was having.  We canceled PET scan.  Instructed him to try to ice in place of heat and to rest area.  Per Dr Lorenso Courier he would prescribe pain medication if patient needed it.  He states he requested tramadol to [facility] and if they do not refill then he will call us back tomorrow.

## 2022-04-28 ENCOUNTER — Ambulatory Visit (HOSPITAL_COMMUNITY): Payer: Medicare Other

## 2022-04-29 ENCOUNTER — Telehealth: Payer: Self-pay

## 2022-04-29 NOTE — Telephone Encounter (Signed)
-----   Message from Fay Records, MD sent at 04/27/2022  3:45 PM EST ----- Would repeat BMET on Friday or early next week

## 2022-04-29 NOTE — Telephone Encounter (Signed)
Fay Records, MD 04/27/2022  3:44 PM EST     Potassium is normal range. He needs to keep following this   Pt should keep on same meds HE should have BMET checked next week   ? If he has a visit in the cancer center where it can be done   Or can check in our office Keep following BP Keep following heart rhythm  Continue amiodarone 400 mg for now

## 2022-04-29 NOTE — Telephone Encounter (Signed)
The patient has been notified of the result and verbalized understanding.  All questions (if any) were answered. Bernestine Amass, RN 04/29/2022 8:52 AM   Repeat labs have been scheduled.

## 2022-05-04 ENCOUNTER — Inpatient Hospital Stay: Payer: Medicare Other | Attending: Hematology and Oncology

## 2022-05-04 ENCOUNTER — Other Ambulatory Visit: Payer: Self-pay

## 2022-05-04 ENCOUNTER — Inpatient Hospital Stay (HOSPITAL_BASED_OUTPATIENT_CLINIC_OR_DEPARTMENT_OTHER): Payer: Medicare Other | Admitting: Hematology and Oncology

## 2022-05-04 ENCOUNTER — Encounter: Payer: Self-pay | Admitting: Internal Medicine

## 2022-05-04 ENCOUNTER — Inpatient Hospital Stay: Payer: Medicare Other

## 2022-05-04 VITALS — BP 154/66 | HR 57 | Temp 97.3°F | Resp 16 | Wt 255.5 lb

## 2022-05-04 DIAGNOSIS — Z86718 Personal history of other venous thrombosis and embolism: Secondary | ICD-10-CM | POA: Insufficient documentation

## 2022-05-04 DIAGNOSIS — Z881 Allergy status to other antibiotic agents status: Secondary | ICD-10-CM | POA: Diagnosis not present

## 2022-05-04 DIAGNOSIS — M898X9 Other specified disorders of bone, unspecified site: Secondary | ICD-10-CM | POA: Insufficient documentation

## 2022-05-04 DIAGNOSIS — M549 Dorsalgia, unspecified: Secondary | ICD-10-CM | POA: Insufficient documentation

## 2022-05-04 DIAGNOSIS — Z7901 Long term (current) use of anticoagulants: Secondary | ICD-10-CM | POA: Diagnosis not present

## 2022-05-04 DIAGNOSIS — Z818 Family history of other mental and behavioral disorders: Secondary | ICD-10-CM | POA: Diagnosis not present

## 2022-05-04 DIAGNOSIS — G629 Polyneuropathy, unspecified: Secondary | ICD-10-CM | POA: Diagnosis not present

## 2022-05-04 DIAGNOSIS — Z5112 Encounter for antineoplastic immunotherapy: Secondary | ICD-10-CM | POA: Insufficient documentation

## 2022-05-04 DIAGNOSIS — Z79899 Other long term (current) drug therapy: Secondary | ICD-10-CM | POA: Insufficient documentation

## 2022-05-04 DIAGNOSIS — I480 Paroxysmal atrial fibrillation: Secondary | ICD-10-CM | POA: Diagnosis not present

## 2022-05-04 DIAGNOSIS — Z8249 Family history of ischemic heart disease and other diseases of the circulatory system: Secondary | ICD-10-CM | POA: Diagnosis not present

## 2022-05-04 DIAGNOSIS — C9000 Multiple myeloma not having achieved remission: Secondary | ICD-10-CM

## 2022-05-04 DIAGNOSIS — Z823 Family history of stroke: Secondary | ICD-10-CM | POA: Diagnosis not present

## 2022-05-04 DIAGNOSIS — Z7961 Long term (current) use of immunomodulator: Secondary | ICD-10-CM | POA: Diagnosis not present

## 2022-05-04 LAB — CMP (CANCER CENTER ONLY)
ALT: 21 U/L (ref 0–44)
AST: 16 U/L (ref 15–41)
Albumin: 3.9 g/dL (ref 3.5–5.0)
Alkaline Phosphatase: 103 U/L (ref 38–126)
Anion gap: 4 — ABNORMAL LOW (ref 5–15)
BUN: 13 mg/dL (ref 8–23)
CO2: 34 mmol/L — ABNORMAL HIGH (ref 22–32)
Calcium: 9.5 mg/dL (ref 8.9–10.3)
Chloride: 104 mmol/L (ref 98–111)
Creatinine: 0.83 mg/dL (ref 0.61–1.24)
GFR, Estimated: >60 mL/min (ref >60)
Glucose, Bld: 93 mg/dL (ref 70–99)
Potassium: 3.6 mmol/L (ref 3.5–5.1)
Sodium: 142 mmol/L (ref 135–145)
Total Bilirubin: 0.4 mg/dL (ref 0.3–1.2)
Total Protein: 6 g/dL — ABNORMAL LOW (ref 6.5–8.1)

## 2022-05-04 LAB — CBC WITH DIFFERENTIAL (CANCER CENTER ONLY)
Abs Immature Granulocytes: 0.03 10*3/uL (ref 0.00–0.07)
Basophils Absolute: 0.1 10*3/uL (ref 0.0–0.1)
Basophils Relative: 1 %
Eosinophils Absolute: 0.3 10*3/uL (ref 0.0–0.5)
Eosinophils Relative: 3 %
HCT: 39.8 % (ref 39.0–52.0)
Hemoglobin: 14 g/dL (ref 13.0–17.0)
Immature Granulocytes: 0 %
Lymphocytes Relative: 3 %
Lymphs Abs: 0.3 10*3/uL — ABNORMAL LOW (ref 0.7–4.0)
MCH: 32.8 pg (ref 26.0–34.0)
MCHC: 35.2 g/dL (ref 30.0–36.0)
MCV: 93.2 fL (ref 80.0–100.0)
Monocytes Absolute: 0.5 10*3/uL (ref 0.1–1.0)
Monocytes Relative: 6 %
Neutro Abs: 8.2 10*3/uL — ABNORMAL HIGH (ref 1.7–7.7)
Neutrophils Relative %: 87 %
Platelet Count: 182 10*3/uL (ref 150–400)
RBC: 4.27 MIL/uL (ref 4.22–5.81)
RDW: 14.7 % (ref 11.5–15.5)
WBC Count: 9.4 10*3/uL (ref 4.0–10.5)
nRBC: 0 % (ref 0.0–0.2)

## 2022-05-04 LAB — LACTATE DEHYDROGENASE: LDH: 126 U/L (ref 98–192)

## 2022-05-04 MED ORDER — TRAMADOL HCL 50 MG PO TABS: 50.0000 mg | ORAL_TABLET | Freq: Four times a day (QID) | ORAL | 0 refills | Status: DC | PRN

## 2022-05-04 MED ORDER — ACETAMINOPHEN 325 MG PO TABS
650.0000 mg | ORAL_TABLET | Freq: Once | ORAL | Status: AC
  Administered 2022-05-04: 650 mg via ORAL
  Filled 2022-05-04: qty 2

## 2022-05-04 MED ORDER — DIPHENHYDRAMINE HCL 25 MG PO CAPS
50.0000 mg | ORAL_CAPSULE | Freq: Once | ORAL | Status: AC
  Administered 2022-05-04: 50 mg via ORAL
  Filled 2022-05-04: qty 2

## 2022-05-04 MED ORDER — DARATUMUMAB-HYALURONIDASE-FIHJ 1800-30000 MG-UT/15ML ~~LOC~~ SOLN
1800.0000 mg | Freq: Once | SUBCUTANEOUS | Status: AC
  Administered 2022-05-04: 1800 mg via SUBCUTANEOUS
  Filled 2022-05-04: qty 15

## 2022-05-04 NOTE — Progress Notes (Signed)
[place] [phone]   [phone]  PROGRESS NOTE  Patient Care Team: [name], MD as PCP - General (Internal Medicine) Fay Records, MD as PCP - Cardiology (Cardiology) Sueanne Margarita, MD as PCP - Sleep Medicine (Cardiology) Thompson Grayer, MD as PCP - Electrophysiology (Cardiology) Michael Boston, MD as Consulting Physician (General Surgery) Clarene Essex, MD as Consulting Physician (Gastroenterology) Sueanne Margarita, MD as Consulting Physician (Sleep Medicine) Alda Berthold, DO as Consulting Physician (Neurology)  Hematological/Oncological History # IgA Kappa Multiple Myeloma 05/05/2020: Bone marrow biopsy showed increased number of plasma cells representing 14% of all cells in the aspirate associated with numerous variably sized clusters in the clot and biopsy sections 04/10/2021: M protein 0.3, IgA kappa specificity. Kappa 580.5, Labda 7.0, ratio 82.93.   12/07/2021: Labs show of 1971.3, lambda 5.5, ratio 358.42 12/10/2021: Transition care to Dr. Lorenso Courier. 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  01/26/2022: Cycle 2 Day 1 of Dara/Rev/Dex 02/22/2022: Cycle 3 Day 1 of Dara/Rev/Dex 03/23/2022: Cycle 4 Day 1 of Dara/Rev/Dex 04/20/2022: Cycle 5 Day 1 of Dara/Rev/Dex  Interval History:  Travis [name] 72 y.o. male with medical history significant for IgA Kappa Multiple Myeloma who presents for a follow up visit. The patient's last visit was on 04/20/2022. In the interim since the last visit he has continued of Dara/Rev/Dex.  On exam today Travis [name] reports he is doing "horrible".  He notes he is having terrible pain in his back and across his shoulders.  He notes that he is not moving as much because of the pain.  He is currently having difficulty refilling his tramadol but is taking his muscle relaxers nightly.  He  reports he is also scheduled for an upcoming appointment with physical therapy.  He reports that he has been eating well and has not been having any other recent side effects.  He notes he is tolerating the chemotherapy well without any nausea, vomiting, or diarrhea.  He does have some occasional neuropathy in his legs and some soreness under his arm on occasion.  Otherwise he has no questions concerns or complaints.  He denies any fevers, chills, sweats, shortness of breath, chest pain.  A full 10 point ROS is listed below.  Overall he is willing and able to proceed with treatment today.  MEDICAL HISTORY:  Past Medical History:  Diagnosis Date   Arthritis    "comes and goes; mostly in hands, occasionally elbow" (36/08/6801)   Complication of anesthesia    "just wanted to sleep alot  for couple days S/P VARICOCELE OR" (04/05/2017)   DVT (deep venous thrombosis) (HCC)    LLE   Dysrhythmia    PVCs    GERD (gastroesophageal reflux disease)    History of hiatal hernia    Hypertension    Impaired glucose tolerance    Multiple myeloma (HCC)    Obesity (BMI 30-39.9) 07/26/2016   OSA on CPAP 07/26/2016   Mild with AHI 12.5/hr now on CPAP at 14cm H2O   PAF (paroxysmal atrial fibrillation) ([place])    s/p PVI Ablation in 2018 // Flecainide Rx // Echo 8/21: EF 60-65, no RWMA, mild LVH, normal RV SF, moderate RVE, mild BAE, trivial MR, mild dilation of aortic root (40 mm)   Pneumonia    "may have had walking pneumonia a few years ago" (04/05/2017)   Pre-diabetes    Thrombophlebitis  SURGICAL HISTORY: Past Surgical History:  Procedure Laterality Date   ATRIAL FIBRILLATION ABLATION  04/05/2017   ATRIAL FIBRILLATION ABLATION N/A 04/05/2017   Procedure: ATRIAL FIBRILLATION ABLATION;  Surgeon: Thompson Grayer, MD;  Location: [facility];  Service: Cardiovascular;  Laterality: N/A;   BONE BIOPSY Right 12/17/2021   Procedure: BONE BIOPSY;  Surgeon: Hiram Gash, MD;  Location: [facility];  Service: Orthopedics;  Laterality: Right;   COMPLETE RIGHT HIP REPLACMENT  01/19/2019   EVALUATION UNDER ANESTHESIA WITH HEMORRHOIDECTOMY N/A 02/24/2017   Procedure: EXAM UNDER ANESTHESIA WITH  HEMORRHOIDECTOMY;  Surgeon: Michael Boston, MD;  Location: [facility];  Service: General;  Laterality: N/A;   EXCISIONAL TOTAL HIP ARTHROPLASTY WITH ANTIBIOTIC SPACERS Right 09/25/2020   Procedure: EXCISIONAL TOTAL HIP ARTHROPLASTY WITH ANTIBIOTIC SPACERS;  Surgeon: Paralee Cancel, MD;  Location: [facility];  Service: Orthopedics;  Laterality: Right;   FLEXIBLE SIGMOIDOSCOPY N/A 04/15/2015   Procedure:  UNSEDATED FLEXIBLE SIGMOIDOSCOPY;  Surgeon: [name], MD;  Location: [facility];  Service: Endoscopy;  Laterality: N/A;   HUMERUS IM NAIL Right 12/17/2021   Procedure: INTRAMEDULLARY (IM) NAIL HUMERAL;  Surgeon: Hiram Gash, MD;  Location: [facility];  Service: Orthopedics;  Laterality: Right;   KNEE ARTHROSCOPY Left ~ 2016   REIMPLANTATION OF TOTAL HIP Right 12/25/2020   Procedure: REIMPLANTATION/REVISION OF RIGHT TOTAL HIP VERSUS REPEAT IRRIGATION AND DEBRIDEMENT;  Surgeon: Paralee Cancel, MD;  Location: [facility];  Service: Orthopedics;  Laterality: Right;   TESTICLE SURGERY  1988   VARICOCELE   TONSILLECTOMY     VARICOSE VEIN SURGERY Bilateral     SOCIAL HISTORY: Social History   Socioeconomic History   Marital status: Married    Spouse name: Not on file   Number of children: 1   Years of education: law school   Highest education level: Not on file  Occupational History   Occupation: lawyer  Tobacco Use   Smoking status: Never   Smokeless tobacco: Never  Vaping Use   Vaping Use: Never used  Substance and Sexual Activity   Alcohol use: Yes    Alcohol/week: 2.0 standard drinks of alcohol    Types: 2 Glasses of wine per week    Comment: 2 glasses of wine a week, occ bourbon, beer   Drug use: No   Sexual activity: Not Currently  Other Topics Concern   Not on file   Social History Narrative   Lives in [place] with spouse in a 2 story home.  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    Social Determinants of Health   Financial Resource Strain: Not on file  Food Insecurity: Not on file  Transportation Needs: Not on file  Physical Activity: Not on file  Stress: Not on file  Social Connections: Not on file  Intimate Partner Violence: Not on file    FAMILY HISTORY: Family History  Problem Relation Age of Onset   Dementia Mother    Stroke Father    Atrial fibrillation Father    Hypertension Father    Hypertension Paternal Grandfather    Dementia Maternal Grandmother     ALLERGIES:  is allergic to linezolid and vancomycin.  MEDICATIONS:  Current Outpatient Medications  Medication Sig Dispense Refill   acetaminophen (TYLENOL) 325 MG tablet Take 2 tablets (650 mg total) by mouth every 6 (six) hours as needed. 36 tablet 0   acyclovir (ZOVIRAX)  400 MG tablet Take 1 tablet (400 mg total) by mouth 2 (two) times daily. 180 tablet 3   amiodarone (PACERONE) 200 MG tablet Take 200 mg twice daily 180 tablet 1   amLODipine (NORVASC) 10 MG tablet Take 10 mg by mouth daily.      Ascorbic Acid (VITAMIN C) 100 MG tablet Take 100 mg by mouth daily.     cholecalciferol (VITAMIN D3) 25 MCG (1000 UNIT) tablet Take 1,000 Units by mouth daily.     clobetasol cream (TEMOVATE) 6.27 % Apply 1 Application topically daily as needed (rash).     Cyanocobalamin (VITAMIN B-12 PO) Take 1 tablet by mouth daily. Not sure the dosage     dexamethasone (DECADRON) 4 MG tablet Take 10 tablets (40 mg total) by mouth once a week. (Patient taking differently: Take 40 mg by mouth every 14 (fourteen) days.) 40 tablet 5   ELIQUIS 5 MG TABS tablet TAKE ONE TABLET BY MOUTH TWICE A DAY 60 tablet 6   fluticasone (FLONASE) 50 MCG/ACT nasal spray Place 1 spray into both nostrils daily as needed for allergies or rhinitis.     folic  acid (FOLVITE) 1 MG tablet Take 1 mg by mouth daily.     furosemide (LASIX) 40 MG tablet Take 40 mg by mouth daily as needed for fluid or edema.     gabapentin (NEURONTIN) 300 MG capsule TAKE TWO CAPSULES BY MOUTH TWICE A DAY 360 capsule 1   hydrALAZINE (APRESOLINE) 10 MG tablet Take one tablet (10 mg) by mouth up to three times a day for BP above 150 and 160. 270 tablet 3   lenalidomide (REVLIMID) 25 MG capsule Take 1 capsule (25 mg total) by mouth daily. Take 21 days on, 7 days off, repeat every 28 days. [employer] auth # [id number] Date obtained: 04/08/22 21 capsule 0   lidocaine (LIDODERM) 5 % Place 1 patch onto the skin daily as needed. Remove & Discard patch within 12 hours or as directed by MD 15 patch 0   lisinopril (ZESTRIL) 40 MG tablet TAKE ONE TABLET BY MOUTH ONE TIME DAILY 90 tablet 3   oxymetazoline (AFRIN) 0.05 % nasal spray Place 1 spray into both nostrils 2 (two) times daily as needed for congestion.     potassium chloride SA (KLOR-CON M) 20 MEQ tablet TAKE ONE TABLET BY MOUTH ONE TIME DAILY, TAKE 40 MG (2 TABLETS) ON DAYS THAT YOU ALSO TAKE LASIX (FUROSEMIDE) (Patient taking differently: Take 30 mEq by mouth daily.) 135 tablet 3   prochlorperazine (COMPAZINE) 10 MG tablet Take 1 tablet (10 mg total) by mouth every 6 (six) hours as needed for nausea or vomiting. 30 tablet 0   psyllium (METAMUCIL) 58.6 % packet Take 1 packet by mouth daily as needed (constipation).     sodium chloride (OCEAN) 0.65 % SOLN nasal spray Place 1 spray into both nostrils as needed for congestion.     traMADol (ULTRAM) 50 MG tablet Take 1 tablet (50 mg total) by mouth every 6 (six) hours as needed. 30 tablet 0   triamcinolone cream (KENALOG) 0.1 % Apply 1 application topically daily as needed (itching).     No current facility-administered medications for this visit.    REVIEW OF SYSTEMS:   Constitutional: ( - ) fevers, ( - )  chills , ( - ) night sweats Eyes: ( - ) blurriness of vision, ( - ) double vision, (  - ) watery eyes Ears, nose, mouth, throat, and face: ( - ) mucositis, ( - )  sore throat Respiratory: ( - ) cough, ( - ) dyspnea, ( - ) wheezes Cardiovascular: ( - ) palpitation, ( - ) chest discomfort, ( - ) lower extremity swelling Gastrointestinal:  ( - ) nausea, ( - ) heartburn, ( - ) change in bowel habits Skin: ( - ) abnormal skin rashes Lymphatics: ( - ) new lymphadenopathy, ( - ) easy bruising Neurological: ( - ) numbness, ( - ) tingling, ( - ) new weaknesses Behavioral/Psych: ( - ) mood change, ( - ) new changes  All other systems were reviewed with the patient and are negative.  PHYSICAL EXAMINATION: ECOG PERFORMANCE STATUS: 1 - Symptomatic but completely ambulatory  Vitals:   05/04/22 1001  BP: (!) 154/66  Pulse: (!) 57  Resp: 16  Temp: (!) 97.3 F (36.3 C)  SpO2: 96%    Filed Weights   05/04/22 1001  Weight: 255 lb 8 oz (115.9 kg)     GENERAL: Well-appearing elderly Caucasian male, alert, no distress and comfortable SKIN: no overt abnormalities of the skin.  EYES: conjunctiva are pink and non-injected, sclera clear LUNGS: clear to auscultation and percussion with normal breathing effort HEART: regular rate & rhythm and no murmurs and no lower extremity edema Musculoskeletal: no cyanosis of digits and no clubbing  PSYCH: alert & oriented x 3, fluent speech NEURO: no focal motor/sensory deficits  LABORATORY DATA:  I have reviewed the data as listed    Latest Ref Rng & Units 05/04/2022    9:44 AM 04/20/2022    9:34 AM 04/06/2022   10:06 AM  CBC  WBC 4.0 - 10.5 K/uL 9.4  5.1  7.2   Hemoglobin 13.0 - 17.0 g/dL 14.0  14.3  13.9   Hematocrit 39.0 - 52.0 % 39.8  41.1  40.7   Platelets 150 - 400 K/uL 182  166  153        Latest Ref Rng & Units 05/04/2022    9:44 AM 04/23/2022   12:41 PM 04/20/2022    9:34 AM  CMP  Glucose 70 - 99 mg/dL 93  102  87   BUN 8 - 23 mg/dL _0 Creatinine 0.61 - 1.24 mg/dL 0.83  0.75  0.82   Sodium 135 - 145 mmol/L 142  141   143   Potassium 3.5 - 5.1 mmol/L 3.6  3.5  2.9   Chloride 98 - 111 mmol/L 104  99  103   CO2 22 - 32 mmol/L 34  27  31   Calcium 8.9 - 10.3 mg/dL 9.5  9.0  9.2   Total Protein 6.5 - 8.1 g/dL 6.0  5.8  7.0   Total Bilirubin 0.3 - 1.2 mg/dL 0.4  0.3  0.6   Alkaline Phos 38 - 126 U/L 103  137  112   AST 15 - 41 U/L _1 ALT 0 - 44 U/L _2 Lab Results  Component Value Date   MPROTEIN 0.1 (H) 04/20/2022   MPROTEIN 0.1 (H) 03/23/2022   MPROTEIN 0.1 (H) 02/22/2022   Lab Results  Component Value Date   KPAFRELGTCHN 287.6 (H) 04/20/2022   KPAFRELGTCHN 240.0 (H) 03/23/2022   KPAFRELGTCHN 303.4 (H) 02/22/2022   LAMBDASER 3.1 (L) 04/20/2022   LAMBDASER 3.1 (L) 03/23/2022   LAMBDASER 3.0 (L) 02/22/2022   KAPLAMBRATIO 92.77 (H) 04/20/2022   KAPLAMBRATIO 77.42 (H) 03/23/2022   KAPLAMBRATIO 101.13 (H) 02/22/2022  RADIOGRAPHIC STUDIES: DG Bone Survey Met  Result Date: 04/17/2022 CLINICAL DATA:  Bone pain, multiple myeloma. EXAM: METASTATIC BONE SURVEY COMPARISON:  Bone survey 12/15/2021 FINDINGS: There is a new right humeral intramedullary nail crossing a pathologic fracture in the proximal humerus. Lytic lesions in the bilateral humeri, skull, right radius have not significantly changed. No new focal lesions. No new fractures are seen. Right hip arthroplasty again noted. Multilevel degenerative changes affect the spine. IMPRESSION: 1. Lytic lesions in the bilateral humeri, skull, right radius have not significantly changed. No new lesions identified. 2. New right humeral intramedullary nail crossing a pathologic fracture in the proximal humerus. Electronically Signed   By: Ronney Asters M.D.   On: 04/17/2022 00:23    ASSESSMENT & PLAN Travis [name] 72 y.o. male with medical history significant for IgA Kappa Multiple Myeloma who presents for a follow up visit.   At this time findings are most consistent with an IgA kappa smoldering multiple myeloma which has  transitioned into a true multiple myeloma.  He meets diagnostic criteria by having a serum free light chain ratio greater than 100 and lytic lesions causing pathological fracture of the humerus.  Given this I would recommend we pursued Darzalex, Revlimid, and dexamethasone.  Given his advanced age I would not recommend transplantation, that this is something we can consider when it comes time to transition to maintenance therapy versus transplant.  The patient voices understanding of the plan moving forward.  # IgA Kappa Multiple Myeloma --Metastatic survey completed 12/15/2021 --Patient underwent fixation of his humerus on 12/17/2021, pathology confirmed plasmacytoma/plasma cell neoplasm.  --Cycle 1 Day 1 of  Darzalex, Revlimid, and dexamethasone started on 12/28/2021.  Plan: --labs today show white blood cell 9.4, hemoglobin 14.0, MCV 93.2, and platelets of 182. --restaging labs today with SPEP and SFLC.  On last check M protein is stable at 0.1 with increase of kappa light chains. Last SFLC showed Kappa 287.6, Lambda 3.1, Ratio 92.77 --today is Cycle 5 Day 15 of Dara/Dex/Revlimid --RTC in 2 weeks with interval q 2 week treatments  # Rash Concerning for Shingles- improving -- Prescribed valacyclovir 3 times daily 1000 mg x 10 days-completed the course -- We will continue to monitor rash closely.  #Left Rib Pain # Right Shoulder Pain/Stiffness -- X-ray of the ribs show no clear issues with the left ribs. --Pain is steadily improving. --known disease in right ribs --Made referral to physical therapy per patient request. -- Continue to monitor  #Supportive Care -- chemotherapy education complete -- port placement not required -- zofran 8m q8H PRN and compazine 122mPO q6H for nausea -- acyclovir 40058mO BID for VCZ prophylaxis -- Xgeva treatment to start, dental clearance has been obtained. Anticipate first dose at next visit.  -- Pain is managed right now with tylenol  Offered stronger  pain medication if pain were to worsen.    Orders Placed This Encounter  Procedures   Multiple Myeloma Panel (SPEP&IFE w/QIG)    Standing Status:   Future    Standing Expiration Date:   05/04/2023   Kappa/lambda light chains    Standing Status:   Future    Standing Expiration Date:   05/04/2023    All questions were answered. The patient knows to call the clinic with any problems, questions or concerns.  A total of more than 30 minutes were spent on this encounter with face-to-face time and non-face-to-face time, including preparing to see the patient, ordering tests and/or medications, counseling the patient  and coordination of care as outlined above.   Travis Peoples, MD Department of Hematology/Oncology [place] [facility] Phone: [phone] Pager: [phone] Email: [email or web address]  05/04/2022 1:53 PM

## 2022-05-04 NOTE — Progress Notes (Signed)
Patient took dexamethasone at home this morning prior to arrival

## 2022-05-04 NOTE — Therapy (Unsigned)
OUTPATIENT PHYSICAL THERAPY SHOULDER EVALUATION   Patient Name: Travis [name] MRN: [id number] DOB:October 20, 1950, 72 y.o., male Today's Date: 05/05/2022  END OF SESSION:  PT End of Session - 05/05/22 0927     Visit Number 1    Date for PT Re-Evaluation 07/28/22    PT Start Time 0924    PT Stop Time 1010    PT Time Calculation (min) 46 min    Activity Tolerance Patient tolerated treatment well    Behavior During Therapy [facility] for tasks assessed/performed             Past Medical History:  Diagnosis Date   Arthritis    "comes and goes; mostly in hands, occasionally elbow" (25/10/3891)   Complication of anesthesia    "just wanted to sleep alot  for couple days S/P VARICOCELE OR" (04/05/2017)   DVT (deep venous thrombosis) (HCC)    LLE   Dysrhythmia    PVCs    GERD (gastroesophageal reflux disease)    History of hiatal hernia    Hypertension    Impaired glucose tolerance    Multiple myeloma (HCC)    Obesity (BMI 30-39.9) 07/26/2016   OSA on CPAP 07/26/2016   Mild with AHI 12.5/hr now on CPAP at 14cm H2O   PAF (paroxysmal atrial fibrillation) ([place])    s/p PVI Ablation in 2018 // Flecainide Rx // Echo 8/21: EF 60-65, no RWMA, mild LVH, normal RV SF, moderate RVE, mild BAE, trivial MR, mild dilation of aortic root (40 mm)   Pneumonia    "may have had walking pneumonia a few years ago" (04/05/2017)   Pre-diabetes    Thrombophlebitis    Past Surgical History:  Procedure Laterality Date   ATRIAL FIBRILLATION ABLATION  04/05/2017   ATRIAL FIBRILLATION ABLATION N/A 04/05/2017   Procedure: ATRIAL FIBRILLATION ABLATION;  Surgeon: Thompson Grayer, MD;  Location: [place] [facility];  Service: Cardiovascular;  Laterality: N/A;   BONE BIOPSY Right 12/17/2021   Procedure: BONE BIOPSY;  Surgeon: Hiram Gash, MD;  Location: [place];  Service: Orthopedics;  Laterality: Right;   COMPLETE RIGHT HIP REPLACMENT  01/19/2019   EVALUATION UNDER ANESTHESIA WITH HEMORRHOIDECTOMY  N/A 02/24/2017   Procedure: EXAM UNDER ANESTHESIA WITH  HEMORRHOIDECTOMY;  Surgeon: Michael Boston, MD;  [facility];  Service: General;  Laterality: N/A;   EXCISIONAL TOTAL HIP ARTHROPLASTY WITH ANTIBIOTIC SPACERS Right 09/25/2020   Procedure: EXCISIONAL TOTAL HIP ARTHROPLASTY WITH ANTIBIOTIC SPACERS;  Surgeon: Paralee Cancel, MD;  [facility];  Service: Orthopedics;  Laterality: Right;   FLEXIBLE SIGMOIDOSCOPY N/A 04/15/2015   Procedure:  UNSEDATED FLEXIBLE SIGMOIDOSCOPY;  Surgeon: [name], MD;  [facility];  Service: Endoscopy;  Laterality: N/A;   HUMERUS IM NAIL Right 12/17/2021   Procedure: INTRAMEDULLARY (IM) NAIL HUMERAL;  Surgeon: Hiram Gash, MD;  Location: [place];  Service: Orthopedics;  Laterality: Right;   KNEE ARTHROSCOPY Left ~ 2016   REIMPLANTATION OF TOTAL HIP Right 12/25/2020   Procedure: REIMPLANTATION/REVISION OF RIGHT TOTAL HIP VERSUS REPEAT IRRIGATION AND DEBRIDEMENT;  Surgeon: Paralee Cancel, MD;  [facility];  Service: Orthopedics;  Laterality: Right;   TESTICLE SURGERY  1988   VARICOCELE   TONSILLECTOMY     VARICOSE VEIN SURGERY Bilateral    Patient Active Problem List   Diagnosis Date Noted   Secondary hypercoagulable state ([place]) 01/11/2022   Multiple myeloma ([place]) 12/10/2021   Medication monitoring encounter 02/27/2021   S/P revision of right total hip  12/25/2020   Status post peripherally inserted central catheter (PICC) central line placement 11/06/2020   Drug rash with eosinophilia and systemic symptoms syndrome 11/06/2020   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[place]) 09/25/2020   PAF (paroxysmal atrial fibrillation) (HCC)    GERD (gastroesophageal reflux disease) 03/29/2018   Hiatal hernia 03/29/2018   History of DVT (deep vein thrombosis) 03/29/2018   Osteoarthritis 03/29/2018   Neuropathy 08/29/2017   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   Paroxysmal atrial fibrillation ([place]) 04/05/2017   Prolapsed  internal hemorrhoids, grade 4, s/p ligation/pexy/hemorrhoidectomy x 2 02/24/2017 02/24/2017   OSA (obstructive sleep apnea) 07/26/2016   Obesity (BMI 30-39.9) 07/26/2016   Atrial fibrillation ([place])    Hypertension    Chest pain at rest     PCP: Okey Dupre a MD  REFERRING PROVIDER: Ledell Peoples, MD  REFERRING DIAG:   Codes Comments  Multiple myeloma not having achieved remission ([place])  - Primary C90.00     THERAPY DIAG:  Abnormal posture  Other lack of coordination  Muscle weakness (generalized)  Neck pain  Chronic right shoulder pain  Rationale for Evaluation and Treatment: Rehabilitation  ONSET DATE: 04/23/22  SUBJECTIVE:                                                                                                                                                                                      SUBJECTIVE STATEMENT: Patient diagnosed with smoldering myeloma in 2018. In July he noted R arm pain which turned out to be a pathological fracture. He had it repaired and is currently receiving PT for that at another facility. In Oct he developed Afib and was hospitalized x 2 days. He developed pain in his neck and L shoulder while moving in the bed. The L side pain resolved, but he has now developed the same pain in his R shoulder and arm. The pain appears to be muscular in nature, tests show no lesions in the area. His R upper traps has been tight and painful since the fracture. He is following an HEP using a 3# weight and bands.   PERTINENT HISTORY: Travis [name] 72 y.o. male with medical history significant for IgA Kappa Multiple Myeloma who presents for a follow up visit. The patient's last visit was on 04/20/2022. In the interim since the last visit he has continued of Dara/Rev/Dex.   #Left Rib Pain # Right Shoulder Pain/Stiffness -- X-ray of the ribs show no clear issues with the left ribs. --Pain is steadily improving. --known disease in right  ribs --Will make referral to physical therapy per patient request. -- Continue to monitor --Patient underwent fixation of his humerus on  12/17/2021, pathology confirmed plasmacytoma/plasma cell neoplasm.  PAIN:  Are you having pain? Yes: NPRS scale: 10/10 Pain location: R shoulder blade, spinal pain lower neck and mid back, sometimes under the R arm along lateral trunk, moves a bit. Pain description: tight muscles, bruising Aggravating factors: lifting Relieving factors: ice, heat, muscle relaxers are minimally helpful.  PRECAUTIONS: Multiple Myeloma with mets to R ribs, R humerus with ORIF for pathological fracture 12/17/21. Lesions have also been found in R rib and also in his skull.  WEIGHT BEARING RESTRICTIONS: No specific, but he reports he still needs to limit his lifting.  FALLS:  Has patient fallen in last 6 months? No  LIVING ENVIRONMENT: Lives with: lives with their family and lives with their spouse Lives in: House/apartment Stairs: Yes: Internal: 14 steps; on left going up and External: 3 steps; on right going up bedroom on first floor, does not use them. Has following equipment at home: None  OCCUPATION: Attorney, trying to retire, finishing up a few remaining projects.  PLOF: Independent  PATIENT GOALS:relieve back pain, strengthen R arm.  NEXT MD VISIT:   OBJECTIVE:   DIAGNOSTIC FINDINGS:  Right hip arthroplasty again noted. Multilevel degenerative changes affect the spine.   IMPRESSION: 1. Lytic lesions in the bilateral humeri, skull, right radius have not significantly changed. No new lesions identified. 2. New right humeral intramedullary nail crossing a pathologic fracture in the proximal humerus.  COGNITION: Overall cognitive status: Within functional limits for tasks assessed     SENSATION: WFL  POSTURE: Severely forward head, mildly kyphotic, round shoulders, neck mildly rotated to R.  UPPER EXTREMITY ROM: WNL unless otherwise  indicated.  Passive ROM Right eval Left eval  Shoulder flexion 104   Shoulder extension    Shoulder abduction 91   Shoulder adduction    Shoulder internal rotation WFL   Shoulder external rotation [facility]   Elbow flexion    Elbow extension    Wrist flexion    Wrist extension    Wrist ulnar deviation    Wrist radial deviation    Wrist pronation    Wrist supination    (Blank rows = not tested)  UPPER EXTREMITY MMT: LUE WNL, R shoulder motion all limited by pain, at least 3/5, elbow at least 4-/5   SHOULDER SPECIAL TESTS: Impingement tests: Hawkins/Kennedy impingement test: negative Rotator cuff assessment: Empty can test: negative and Belly press test: negative   JOINT MOBILITY TESTING:  R shoulder tight in inferior and posterior glide. R scapula sits high on back, mildly adducted, stiff, decreased mobility.  PALPATION:  Very tight and TTP on R cervical paraspinals, R up traps, R peri scapular muscles, B pects.   TODAY'S TREATMENT:                                                                                                                                         DATE:  05/05/22 Education   PATIENT EDUCATION: Education details: POC Person educated: Patient Education method: Explanation Education comprehension: verbalized understanding  HOME EXERCISE PROGRAM: Has HEP from another therapist for R shoulder rehab after ORIF for pathological humerus fracture.  ASSESSMENT:  CLINICAL IMPRESSION: Patient is a 71 y.o. who was seen today for physical therapy evaluation and treatment for R neck and shoulder pain. He has a complicated history including multiple myeloma with lesions in his R ribs, scalp, and R humerus. The humerus was fractured and he had an ORIF in August. He has been receiving PT for this ever since at a different facility.  He reports a new onset of the neck and R shoulder pain which is focused around his R scapula, under his R arm, and in his neck. It goes up to  10/10 at times. He demonstrates limited ROM and strength on the R, with severe tightness in R posterior neck, R up traps, pects. R scapula and humerus joint mobility limited  OBJECTIVE IMPAIRMENTS: decreased activity tolerance, decreased coordination, decreased ROM, decreased strength, increased muscle spasms, impaired flexibility, improper body mechanics, postural dysfunction, and pain.   ACTIVITY LIMITATIONS: carrying, lifting, and reach over head  PARTICIPATION LIMITATIONS: meal prep, cleaning, shopping, and community activity  PERSONAL FACTORS: Age, Past/current experiences, and 1 comorbidity: mult myeloma  are also affecting patient's functional outcome.   REHAB POTENTIAL: Good  CLINICAL DECISION MAKING: Evolving/moderate complexity  EVALUATION COMPLEXITY: Moderate   GOALS: Goals reviewed with patient? Yes  SHORT TERM GOALS: Target date: 05/20/22  I with basic HEP Baseline: Goal status: INITIAL  LONG TERM GOALS: Target date: 07/28/22  I with final HEP Baseline:  Goal status: INITIAL  2.  Patient will improve painfree R shoulder ROM to at least 110 flex and abd Baseline: Painful, < 90 Goal status: INITIAL  3.  Increase R shoulder strength to at least 4-/5 with pain < 4/10 Baseline:  Goal status: INITIAL  4.  Improve pain in neck and shoulder by at least 50% Baseline: Up to 10/10 Goal status: INITIAL  PLAN:  PT FREQUENCY: 2x/week  PT DURATION: 12 weeks  PLANNED INTERVENTIONS: Therapeutic exercises, Therapeutic activity, Neuromuscular re-education, Balance training, Gait training, Patient/Family education, Self Care, Joint mobilization, Cryotherapy, Moist heat, Vasopneumatic device, Ionotophoresis 30m/ml Dexamethasone, and Manual therapy  PLAN FOR NEXT SESSION: HEP, acute pain management   SMarcelina Morel DPT 05/05/2022, 6:26 PM

## 2022-05-05 ENCOUNTER — Encounter: Payer: Self-pay | Admitting: Physical Therapy

## 2022-05-05 ENCOUNTER — Telehealth: Payer: Self-pay | Admitting: Hematology and Oncology

## 2022-05-05 ENCOUNTER — Other Ambulatory Visit: Payer: Medicare Other

## 2022-05-05 ENCOUNTER — Ambulatory Visit: Payer: Medicare Other | Attending: Hematology and Oncology | Admitting: Physical Therapy

## 2022-05-05 DIAGNOSIS — M25511 Pain in right shoulder: Secondary | ICD-10-CM | POA: Insufficient documentation

## 2022-05-05 DIAGNOSIS — M542 Cervicalgia: Secondary | ICD-10-CM

## 2022-05-05 DIAGNOSIS — R278 Other lack of coordination: Secondary | ICD-10-CM | POA: Diagnosis present

## 2022-05-05 DIAGNOSIS — M6281 Muscle weakness (generalized): Secondary | ICD-10-CM | POA: Diagnosis present

## 2022-05-05 DIAGNOSIS — G8929 Other chronic pain: Secondary | ICD-10-CM | POA: Diagnosis present

## 2022-05-05 DIAGNOSIS — R293 Abnormal posture: Secondary | ICD-10-CM | POA: Diagnosis present

## 2022-05-05 DIAGNOSIS — C9000 Multiple myeloma not having achieved remission: Secondary | ICD-10-CM | POA: Diagnosis not present

## 2022-05-05 NOTE — Telephone Encounter (Signed)
Called patient to schedule lab f/u per 1/2 los notes. Patient notified of new appointment.

## 2022-05-07 NOTE — Progress Notes (Signed)
Travis [name] Phone: [phone] Subjective:   Travis [name], am serving as a scribe for Dr. Hulan Saas.  I'm seeing this patient by the request  of:  [name], MD, [name] MD   CC: Left shoulder pain  NUU:[id number]  Travis [name] is a 72 y.o. male coming in with complaint of L shoulder pain.  Past medical history significant for smoldering multiple myeloma patient states, paroxysmal atrial fibrillation.  Reviewing patient's previous imaging patient has had complicated history and had multiple lesions on the right side of the ribs, scalp and right humerus for multiple myeloma.  Reviewed patient's physical therapy notes where patient has been seen even 3 days ago starting on some physical therapy.  Reviewed most recent bone survey that did show lytic lesions in the bilateral humerus is now and has had pathological fracture it appears of the proximal humerus.    Past Medical History:  Diagnosis Date   Arthritis    "comes and goes; mostly in hands, occasionally elbow" (47/08/2593)   Complication of anesthesia    "just wanted to sleep alot  for couple days S/P VARICOCELE OR" (04/05/2017)   DVT (deep venous thrombosis) (HCC)    LLE   Dysrhythmia    PVCs    GERD (gastroesophageal reflux disease)    History of hiatal hernia    Hypertension    Impaired glucose tolerance    Multiple myeloma (HCC)    Obesity (BMI 30-39.9) 07/26/2016   OSA on CPAP 07/26/2016   Mild with AHI 12.5/hr now on CPAP at 14cm H2O   PAF (paroxysmal atrial fibrillation) ([place])    s/p PVI Ablation in 2018 // Flecainide Rx // Echo 8/21: EF 60-65, [name] RWMA, mild LVH, normal RV SF, moderate RVE, mild BAE, trivial MR, mild dilation of aortic root (40 mm)   Pneumonia    "may have had walking pneumonia a few years ago" (04/05/2017)   Pre-diabetes    Thrombophlebitis    Past Surgical History:  Procedure Laterality Date   ATRIAL  FIBRILLATION ABLATION  04/05/2017   ATRIAL FIBRILLATION ABLATION N/A 04/05/2017   Procedure: ATRIAL FIBRILLATION ABLATION;  Surgeon: Thompson Grayer, MD;  Location: [place] [facility];  Service: Cardiovascular;  Laterality: N/A;   BONE BIOPSY Right 12/17/2021   Procedure: BONE BIOPSY;  Surgeon: Hiram Gash, MD;  Location: [place];  Service: Orthopedics;  Laterality: Right;   COMPLETE RIGHT HIP REPLACMENT  01/19/2019   EVALUATION UNDER ANESTHESIA WITH HEMORRHOIDECTOMY N/A 02/24/2017   Procedure: EXAM UNDER ANESTHESIA WITH  HEMORRHOIDECTOMY;  Surgeon: Michael Boston, MD;  [facility];  Service: General;  Laterality: N/A;   EXCISIONAL TOTAL HIP ARTHROPLASTY WITH ANTIBIOTIC SPACERS Right 09/25/2020   Procedure: EXCISIONAL TOTAL HIP ARTHROPLASTY WITH ANTIBIOTIC SPACERS;  Surgeon: Paralee Cancel, MD;  [facility];  Service: Orthopedics;  Laterality: Right;   FLEXIBLE SIGMOIDOSCOPY N/A 04/15/2015   Procedure:  UNSEDATED FLEXIBLE SIGMOIDOSCOPY;  Surgeon: [name], MD;  [facility];  Service: Endoscopy;  Laterality: N/A;   HUMERUS IM NAIL Right 12/17/2021   Procedure: INTRAMEDULLARY (IM) NAIL HUMERAL;  Surgeon: Hiram Gash, MD;  Location: [place];  Service: Orthopedics;  Laterality: Right;   KNEE ARTHROSCOPY Left ~ 2016   REIMPLANTATION OF TOTAL HIP Right 12/25/2020   Procedure: REIMPLANTATION/REVISION OF RIGHT TOTAL HIP VERSUS REPEAT IRRIGATION AND DEBRIDEMENT;  Surgeon: Paralee Cancel, MD;  [facility];  Service: Orthopedics;  Laterality: Right;   TESTICLE SURGERY  1988   VARICOCELE   TONSILLECTOMY     VARICOSE VEIN SURGERY Bilateral    Social History   Socioeconomic History   Marital status: Married    Spouse name: Not on file   Number of children: 1   Years of education: law school   Highest education level: Not on file  Occupational History   Occupation: lawyer  Tobacco Use   Smoking status: Never   Smokeless tobacco: Never   Vaping Use   Vaping Use: Never used  Substance and Sexual Activity   Alcohol use: Yes    Alcohol/week: 2.0 standard drinks of alcohol    Types: 2 Glasses of wine per week    Comment: 2 glasses of wine a week, occ bourbon, beer   Drug use: [name]   Sexual activity: Not Currently  Other Topics Concern   Not on file  Social History Narrative   Lives in [place] with spouse in a 2 story home.  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    Social Determinants of Health   Financial Resource Strain: Not on file  Food Insecurity: Not on file  Transportation Needs: Not on file  Physical Activity: Not on file  Stress: Not on file  Social Connections: Not on file   Allergies  Allergen Reactions   Linezolid Other (See Comments)    Burning tongue    Vancomycin Rash   Family History  Problem Relation Age of Onset   Dementia Mother    Stroke Father    Atrial fibrillation Father    Hypertension Father    Hypertension Paternal Grandfather    Dementia Maternal Grandmother     Current Outpatient Medications (Endocrine & Metabolic):    dexamethasone (DECADRON) 4 MG tablet, Take 10 tablets (40 mg total) by mouth once a week. (Patient taking differently: Take 40 mg by mouth every 14 (fourteen) days.)  Current Outpatient Medications (Cardiovascular):    amiodarone (PACERONE) 200 MG tablet, Take 1 tablet (200 mg total) by mouth daily.   amLODipine (NORVASC) 10 MG tablet, Take 10 mg by mouth daily.    furosemide (LASIX) 40 MG tablet, Take 40 mg by mouth daily as needed for fluid or edema.   hydrALAZINE (APRESOLINE) 25 MG tablet, Take 1.5 tablets (37.5 mg total) by mouth 3 (three) times daily.   lisinopril (ZESTRIL) 40 MG tablet, TAKE ONE TABLET BY MOUTH ONE TIME DAILY  Current Outpatient Medications (Respiratory):    fluticasone (FLONASE) 50 MCG/ACT nasal spray, Place 1 spray into both nostrils daily as needed for allergies or  rhinitis.   oxymetazoline (AFRIN) 0.05 % nasal spray, Place 1 spray into both nostrils 2 (two) times daily as needed for congestion.   sodium chloride (OCEAN) 0.65 % SOLN nasal spray, Place 1 spray into both nostrils as needed for congestion.  Current Outpatient Medications (Analgesics):    acetaminophen (TYLENOL) 325 MG tablet, Take 2 tablets (650 mg total) by mouth every 6 (six) hours as needed.   traMADol (ULTRAM) 50 MG tablet, Take 1 tablet (50 mg total) by mouth every 6 (six) hours as needed.  Current Outpatient Medications (Hematological):    Cyanocobalamin (VITAMIN B-12 PO), Take 1 tablet by mouth daily. Not sure the dosage   ELIQUIS 5 MG TABS tablet, TAKE ONE TABLET BY MOUTH TWICE A DAY   folic acid (FOLVITE) 1 MG tablet, Take  1 mg by mouth daily.  Current Outpatient Medications (Other):    acyclovir (ZOVIRAX) 400 MG tablet, Take 1 tablet (400 mg total) by mouth 2 (two) times daily.   Ascorbic Acid (VITAMIN C) 100 MG tablet, Take 100 mg by mouth daily.   cholecalciferol (VITAMIN D3) 25 MCG (1000 UNIT) tablet, Take 1,000 Units by mouth daily.   clobetasol cream (TEMOVATE) 4.26 %, Apply 1 Application topically daily as needed (rash).   gabapentin (NEURONTIN) 300 MG capsule, TAKE TWO CAPSULES BY MOUTH TWICE A DAY   lenalidomide (REVLIMID) 25 MG capsule, Take 1 capsule (25 mg total) by mouth daily. Take 21 days on, 7 days off, repeat every 28 days. [employer] auth # [id number] Date obtained: 05/12/21   lidocaine (LIDODERM) 5 %, Place 1 patch onto the skin daily as needed. Remove & Discard patch within 12 hours or as directed by MD   potassium chloride SA (KLOR-CON M) 20 MEQ tablet, TAKE ONE TABLET BY MOUTH ONE TIME DAILY, TAKE 40 MG (2 TABLETS) ON DAYS THAT YOU ALSO TAKE LASIX (FUROSEMIDE) (Patient taking differently: Take 30 mEq by mouth daily.)   prochlorperazine (COMPAZINE) 10 MG tablet, Take 1 tablet (10 mg total) by mouth every 6 (six) hours as needed for nausea or vomiting.   psyllium  (METAMUCIL) 58.6 % packet, Take 1 packet by mouth daily as needed (constipation).   triamcinolone cream (KENALOG) 0.1 %, Apply 1 application topically daily as needed (itching).   Reviewed prior external information including notes and imaging from  primary care provider As well as notes that were available from care everywhere and other healthcare systems.  Past medical history, social, surgical and family history all reviewed in electronic medical record.  [name] pertanent information unless stated regarding to the chief complaint.   Review of Systems:  [name] headache, visual changes, nausea, vomiting, diarrhea, constipation, dizziness, abdominal pain, skin rash, fevers, chills, night sweats, weight loss, swollen lymph nodes, body aches, joint swelling, chest pain, shortness of breath, mood changes. POSITIVE muscle aches  Objective  Blood pressure (!) 140/78, pulse 60, height '5\' 10"'$  (1.778 m), weight 249 lb (112.9 kg), SpO2 98 %.   General: [name] apparent distress alert and oriented x3 mood and affect normal, dressed appropriately.  HEENT: Pupils equal, extraocular movements intact  Respiratory: Patient's speak in full sentences and does not appear short of breath  Cardiovascular: [name] lower extremity edema, non tender, [name] erythema  Severe loss of lordosis, neck [name] extension noted.  Patient has some atrophy of the shoulder girdle bilaterally.  Does have weakness of the shoulders bilaterally as well.  Significant limitation from postsurgical changes of the right arm.  Neurovascularly intact distally though.  Patient has minimal to [name] sidebending of the neck noted as well.     Impression and Recommendations:     The above documentation has been reviewed and is accurate and complete Lyndal Pulley, DO

## 2022-05-10 ENCOUNTER — Ambulatory Visit: Payer: Medicare Other | Admitting: Physical Therapy

## 2022-05-10 ENCOUNTER — Encounter: Payer: Self-pay | Admitting: Hematology and Oncology

## 2022-05-10 ENCOUNTER — Encounter: Payer: Self-pay | Admitting: Physical Therapy

## 2022-05-10 DIAGNOSIS — M542 Cervicalgia: Secondary | ICD-10-CM

## 2022-05-10 DIAGNOSIS — M6281 Muscle weakness (generalized): Secondary | ICD-10-CM

## 2022-05-10 DIAGNOSIS — R278 Other lack of coordination: Secondary | ICD-10-CM

## 2022-05-10 DIAGNOSIS — R293 Abnormal posture: Secondary | ICD-10-CM | POA: Diagnosis not present

## 2022-05-10 DIAGNOSIS — G8929 Other chronic pain: Secondary | ICD-10-CM

## 2022-05-10 NOTE — Therapy (Signed)
OUTPATIENT PHYSICAL THERAPY SHOULDER TREATMENT   Patient Name: Travis [name] MRN: [id number] DOB:10/24/1950, 72 y.o., male Today's Date: 05/10/2022  END OF SESSION:  PT End of Session - 05/10/22 1009     Visit Number 2    Date for PT Re-Evaluation 07/28/22    PT Start Time 1010    PT Stop Time 1055    PT Time Calculation (min) 45 min    Activity Tolerance Patient tolerated treatment well    Behavior During Therapy [facility] for tasks assessed/performed             Past Medical History:  Diagnosis Date   Arthritis    "comes and goes; mostly in hands, occasionally elbow" (63/0/1601)   Complication of anesthesia    "just wanted to sleep alot  for couple days S/P VARICOCELE OR" (04/05/2017)   DVT (deep venous thrombosis) (HCC)    LLE   Dysrhythmia    PVCs    GERD (gastroesophageal reflux disease)    History of hiatal hernia    Hypertension    Impaired glucose tolerance    Multiple myeloma (HCC)    Obesity (BMI 30-39.9) 07/26/2016   OSA on CPAP 07/26/2016   Mild with AHI 12.5/hr now on CPAP at 14cm H2O   PAF (paroxysmal atrial fibrillation) ([place])    s/p PVI Ablation in 2018 // Flecainide Rx // Echo 8/21: EF 60-65, no RWMA, mild LVH, normal RV SF, moderate RVE, mild BAE, trivial MR, mild dilation of aortic root (40 mm)   Pneumonia    "may have had walking pneumonia a few years ago" (04/05/2017)   Pre-diabetes    Thrombophlebitis    Past Surgical History:  Procedure Laterality Date   ATRIAL FIBRILLATION ABLATION  04/05/2017   ATRIAL FIBRILLATION ABLATION N/A 04/05/2017   Procedure: ATRIAL FIBRILLATION ABLATION;  Surgeon: Thompson Grayer, MD;  Location: [place] [facility];  Service: Cardiovascular;  Laterality: N/A;   BONE BIOPSY Right 12/17/2021   Procedure: BONE BIOPSY;  Surgeon: Hiram Gash, MD;  Location: [place];  Service: Orthopedics;  Laterality: Right;   COMPLETE RIGHT HIP REPLACMENT  01/19/2019   EVALUATION UNDER ANESTHESIA WITH HEMORRHOIDECTOMY  N/A 02/24/2017   Procedure: EXAM UNDER ANESTHESIA WITH  HEMORRHOIDECTOMY;  Surgeon: Michael Boston, MD;  Location: [facility];  Service: General;  Laterality: N/A;   EXCISIONAL TOTAL HIP ARTHROPLASTY WITH ANTIBIOTIC SPACERS Right 09/25/2020   Procedure: EXCISIONAL TOTAL HIP ARTHROPLASTY WITH ANTIBIOTIC SPACERS;  Surgeon: Paralee Cancel, MD;  Location: [facility];  Service: Orthopedics;  Laterality: Right;   FLEXIBLE SIGMOIDOSCOPY N/A 04/15/2015   Procedure:  UNSEDATED FLEXIBLE SIGMOIDOSCOPY;  Surgeon: [name], MD;  Location: [facility];  Service: Endoscopy;  Laterality: N/A;   HUMERUS IM NAIL Right 12/17/2021   Procedure: INTRAMEDULLARY (IM) NAIL HUMERAL;  Surgeon: Hiram Gash, MD;  Location: [place];  Service: Orthopedics;  Laterality: Right;   KNEE ARTHROSCOPY Left ~ 2016   REIMPLANTATION OF TOTAL HIP Right 12/25/2020   Procedure: REIMPLANTATION/REVISION OF RIGHT TOTAL HIP VERSUS REPEAT IRRIGATION AND DEBRIDEMENT;  Surgeon: Paralee Cancel, MD;  Location: [facility];  Service: Orthopedics;  Laterality: Right;   TESTICLE SURGERY  1988   VARICOCELE   TONSILLECTOMY     VARICOSE VEIN SURGERY Bilateral    Patient Active Problem List   Diagnosis Date Noted   Secondary hypercoagulable state ([place]) 01/11/2022   Multiple myeloma ([place]) 12/10/2021   Medication monitoring encounter 02/27/2021   S/P revision of right total hip  12/25/2020   Status post peripherally inserted central catheter (PICC) central line placement 11/06/2020   Drug rash with eosinophilia and systemic symptoms syndrome 11/06/2020   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[place]) 09/25/2020   PAF (paroxysmal atrial fibrillation) (HCC)    GERD (gastroesophageal reflux disease) 03/29/2018   Hiatal hernia 03/29/2018   History of DVT (deep vein thrombosis) 03/29/2018   Osteoarthritis 03/29/2018   Neuropathy 08/29/2017   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   Paroxysmal atrial fibrillation ([place]) 04/05/2017   Prolapsed  internal hemorrhoids, grade 4, s/p ligation/pexy/hemorrhoidectomy x 2 02/24/2017 02/24/2017   OSA (obstructive sleep apnea) 07/26/2016   Obesity (BMI 30-39.9) 07/26/2016   Atrial fibrillation ([place])    Hypertension    Chest pain at rest     PCP: Okey Dupre a MD  REFERRING PROVIDER: Ledell Peoples, MD  REFERRING DIAG:   Codes Comments  Multiple myeloma not having achieved remission ([place])  - Primary C90.00     THERAPY DIAG:  Abnormal posture  Other lack of coordination  Muscle weakness (generalized)  Chronic right shoulder pain  Neck pain  Rationale for Evaluation and Treatment: Rehabilitation  ONSET DATE: 04/23/22  SUBJECTIVE:                                                                                                                                                                                      SUBJECTIVE STATEMENT: Pain in the upper middle spine today, burns today. Knot below the R shoulder blade.  He is following an HEP using a 3# weight and bands.   PERTINENT HISTORY: Travis [name] 71 y.o. male with medical history significant for IgA Kappa Multiple Myeloma who presents for a follow up visit. The patient's last visit was on 04/20/2022. In the interim since the last visit he has continued of Dara/Rev/Dex.   #Left Rib Pain # Right Shoulder Pain/Stiffness -- X-ray of the ribs show no clear issues with the left ribs. --Pain is steadily improving. --known disease in right ribs --Will make referral to physical therapy per patient request. -- Continue to monitor --Patient underwent fixation of his humerus on 12/17/2021, pathology confirmed plasmacytoma/plasma cell neoplasm.  PAIN:  Are you having pain? Yes: NPRS scale: 9/10 Pain location: R shoulder blade, spinal pain lower neck and mid back, sometimes under the R arm along lateral trunk, moves a bit. Pain description: tight muscles, bruising Aggravating factors: lifting Relieving factors: ice,  heat, muscle relaxers are minimally helpful.  PRECAUTIONS: Multiple Myeloma with mets to R ribs, R humerus with ORIF for pathological fracture 12/17/21. Lesions have also been found in R rib and also in his skull.  WEIGHT BEARING RESTRICTIONS:  No specific, but he reports he still needs to limit his lifting.  FALLS:  Has patient fallen in last 6 months? No  LIVING ENVIRONMENT: Lives with: lives with their family and lives with their spouse Lives in: House/apartment Stairs: Yes: Internal: 14 steps; on left going up and External: 3 steps; on right going up bedroom on first floor, does not use them. Has following equipment at home: None  OCCUPATION: Attorney, trying to retire, finishing up a few remaining projects.  PLOF: Independent  PATIENT GOALS:relieve back pain, strengthen R arm.  NEXT MD VISIT:   OBJECTIVE:   DIAGNOSTIC FINDINGS:  Right hip arthroplasty again noted. Multilevel degenerative changes affect the spine.   IMPRESSION: 1. Lytic lesions in the bilateral humeri, skull, right radius have not significantly changed. No new lesions identified. 2. New right humeral intramedullary nail crossing a pathologic fracture in the proximal humerus.  COGNITION: Overall cognitive status: Within functional limits for tasks assessed     SENSATION: WFL  POSTURE: Severely forward head, mildly kyphotic, round shoulders, neck mildly rotated to R.  UPPER EXTREMITY ROM: WNL unless otherwise indicated.  Passive ROM Right eval Left eval  Shoulder flexion 104   Shoulder extension    Shoulder abduction 91   Shoulder adduction    Shoulder internal rotation WFL   Shoulder external rotation [facility]   Elbow flexion    Elbow extension    Wrist flexion    Wrist extension    Wrist ulnar deviation    Wrist radial deviation    Wrist pronation    Wrist supination    (Blank rows = not tested)  UPPER EXTREMITY MMT: LUE WNL, R shoulder motion all limited by pain, at least 3/5, elbow at  least 4-/5   SHOULDER SPECIAL TESTS: Impingement tests: Hawkins/Kennedy impingement test: negative Rotator cuff assessment: Empty can test: negative and Belly press test: negative   JOINT MOBILITY TESTING:  R shoulder tight in inferior and posterior glide. R scapula sits high on back, mildly adducted, stiff, decreased mobility.  PALPATION:  Very tight and TTP on R cervical paraspinals, R up traps, R peri scapular muscles, B pects.   TODAY'S TREATMENT:                                                                                                                                         DATE:  05/10/21  NuStep L3 x 6 min AAROM Shoulder 2lb WaTE flex, Ext, IR x10 Seated rows green 2x12 Seated ER Red 2x10 Horiz Abd yellow 2x10 STM to upper R T trap and rhomboid  DN performed by Lead PT M. Albright   05/05/22 Education   PATIENT EDUCATION: Education details: POC Person educated: Patient Education method: Explanation Education comprehension: verbalized understanding  HOME EXERCISE PROGRAM: Has HEP from another therapist for R shoulder rehab after ORIF for pathological humerus fracture.  ASSESSMENT:  CLINICAL IMPRESSION: Patient is a  72 y.o. who was seen today for physical therapy treatment for R neck and shoulder pain. He has a complicated history including multiple myeloma with lesions in his R ribs, scalp, and R humerus. Today he enters with reports mid spine pain. Pt has a depress and protracted R scapula at rest. Instructed pt on some lost strengthening interventions to work on posture. Some R scapula discomfort reported with horizontal abduction. Pt has limited ROM with shoulder flexion. Lead PT assisted in session with DN.  OBJECTIVE IMPAIRMENTS: decreased activity tolerance, decreased coordination, decreased ROM, decreased strength, increased muscle spasms, impaired flexibility, improper body mechanics, postural dysfunction, and pain.   ACTIVITY LIMITATIONS: carrying,  lifting, and reach over head  PARTICIPATION LIMITATIONS: meal prep, cleaning, shopping, and community activity  PERSONAL FACTORS: Age, Past/current experiences, and 1 comorbidity: mult myeloma  are also affecting patient's functional outcome.   REHAB POTENTIAL: Good  CLINICAL DECISION MAKING: Evolving/moderate complexity  EVALUATION COMPLEXITY: Moderate   GOALS: Goals reviewed with patient? Yes  SHORT TERM GOALS: Target date: 05/20/22  I with basic HEP Baseline: Goal status: INITIAL  LONG TERM GOALS: Target date: 07/28/22  I with final HEP Baseline:  Goal status: INITIAL  2.  Patient will improve painfree R shoulder ROM to at least 110 flex and abd Baseline: Painful, < 90 Goal status: INITIAL  3.  Increase R shoulder strength to at least 4-/5 with pain < 4/10 Baseline:  Goal status: INITIAL  4.  Improve pain in neck and shoulder by at least 50% Baseline: Up to 10/10 Goal status: INITIAL  PLAN:  PT FREQUENCY: 2x/week  PT DURATION: 12 weeks  PLANNED INTERVENTIONS: Therapeutic exercises, Therapeutic activity, Neuromuscular re-education, Balance training, Gait training, Patient/Family education, Self Care, Joint mobilization, Cryotherapy, Moist heat, Vasopneumatic device, Ionotophoresis '4mg'$ /ml Dexamethasone, and Manual therapy  PLAN FOR NEXT SESSION: HEP, acute pain management   Marcelina Morel, DPT 05/10/2022, 10:09 AM

## 2022-05-11 ENCOUNTER — Inpatient Hospital Stay: Payer: Medicare Other

## 2022-05-11 ENCOUNTER — Other Ambulatory Visit: Payer: Self-pay

## 2022-05-11 ENCOUNTER — Telehealth: Payer: Self-pay

## 2022-05-11 DIAGNOSIS — C9000 Multiple myeloma not having achieved remission: Secondary | ICD-10-CM

## 2022-05-11 DIAGNOSIS — D472 Monoclonal gammopathy: Secondary | ICD-10-CM

## 2022-05-11 DIAGNOSIS — Z5112 Encounter for antineoplastic immunotherapy: Secondary | ICD-10-CM | POA: Diagnosis not present

## 2022-05-11 LAB — CBC WITH DIFFERENTIAL (CANCER CENTER ONLY)
Abs Immature Granulocytes: 0.03 10*3/uL (ref 0.00–0.07)
Basophils Absolute: 0 10*3/uL (ref 0.0–0.1)
Basophils Relative: 0 %
Eosinophils Absolute: 0.3 10*3/uL (ref 0.0–0.5)
Eosinophils Relative: 3 %
HCT: 41.4 % (ref 39.0–52.0)
Hemoglobin: 14.5 g/dL (ref 13.0–17.0)
Immature Granulocytes: 0 %
Lymphocytes Relative: 6 %
Lymphs Abs: 0.6 10*3/uL — ABNORMAL LOW (ref 0.7–4.0)
MCH: 32.7 pg (ref 26.0–34.0)
MCHC: 35 g/dL (ref 30.0–36.0)
MCV: 93.2 fL (ref 80.0–100.0)
Monocytes Absolute: 1.7 10*3/uL — ABNORMAL HIGH (ref 0.1–1.0)
Monocytes Relative: 18 %
Neutro Abs: 7 10*3/uL (ref 1.7–7.7)
Neutrophils Relative %: 73 %
Platelet Count: 165 10*3/uL (ref 150–400)
RBC: 4.44 MIL/uL (ref 4.22–5.81)
RDW: 14.9 % (ref 11.5–15.5)
WBC Count: 9.5 10*3/uL (ref 4.0–10.5)
nRBC: 0 % (ref 0.0–0.2)

## 2022-05-11 LAB — CMP (CANCER CENTER ONLY)
ALT: 27 U/L (ref 0–44)
AST: 17 U/L (ref 15–41)
Albumin: 4.2 g/dL (ref 3.5–5.0)
Alkaline Phosphatase: 111 U/L (ref 38–126)
Anion gap: 6 (ref 5–15)
BUN: 17 mg/dL (ref 8–23)
CO2: 30 mmol/L (ref 22–32)
Calcium: 9.3 mg/dL (ref 8.9–10.3)
Chloride: 103 mmol/L (ref 98–111)
Creatinine: 0.82 mg/dL (ref 0.61–1.24)
GFR, Estimated: >60 mL/min (ref >60)
Glucose, Bld: 105 mg/dL — ABNORMAL HIGH (ref 70–99)
Potassium: 3.3 mmol/L — ABNORMAL LOW (ref 3.5–5.1)
Sodium: 139 mmol/L (ref 135–145)
Total Bilirubin: 0.4 mg/dL (ref 0.3–1.2)
Total Protein: 6.5 g/dL (ref 6.5–8.1)

## 2022-05-11 LAB — LACTATE DEHYDROGENASE: LDH: 142 U/L (ref 98–192)

## 2022-05-11 NOTE — Telephone Encounter (Signed)
Patient called to report increasing pain in "spine. Half way down my back up to my neck." Patient had PT yesterday and had dry needling. Last Friday patient met with orthopedist and imaging done at this apt did not show anything concerning. Patient will meet with Dr. Gardenia Phlegm (sports medicine) in the near future.  This RN spoke with Dr. Lorenso Courier. Dr, Lorenso Courier can manage pain medicine but recent scans show no answer for pain. Patient is taking Ultram as prescribed. Patient stated that his orthopedist did not want patient to take opioids. Patient  states that he will wait to see what Sports Medicine doctor advises.

## 2022-05-12 ENCOUNTER — Encounter: Payer: Self-pay | Admitting: Cardiology

## 2022-05-12 ENCOUNTER — Ambulatory Visit: Payer: Medicare Other | Attending: Cardiology | Admitting: Cardiology

## 2022-05-12 ENCOUNTER — Other Ambulatory Visit: Payer: Self-pay

## 2022-05-12 VITALS — BP 160/70 | HR 62 | Ht 70.0 in | Wt 251.0 lb

## 2022-05-12 DIAGNOSIS — I480 Paroxysmal atrial fibrillation: Secondary | ICD-10-CM | POA: Diagnosis not present

## 2022-05-12 DIAGNOSIS — C9000 Multiple myeloma not having achieved remission: Secondary | ICD-10-CM

## 2022-05-12 LAB — KAPPA/LAMBDA LIGHT CHAINS
Kappa free light chain: 197.9 mg/L — ABNORMAL HIGH (ref 3.3–19.4)
Kappa, lambda light chain ratio: 76.12 — ABNORMAL HIGH (ref 0.26–1.65)
Lambda free light chains: 2.6 mg/L — ABNORMAL LOW (ref 5.7–26.3)

## 2022-05-12 MED ORDER — LENALIDOMIDE 25 MG PO CAPS: 25.0000 mg | ORAL_CAPSULE | Freq: Every day | ORAL | 0 refills | Status: DC

## 2022-05-12 MED ORDER — HYDRALAZINE HCL 25 MG PO TABS: 37.5000 mg | ORAL_TABLET | Freq: Three times a day (TID) | ORAL | 1 refills | Status: DC

## 2022-05-12 MED ORDER — AMIODARONE HCL 200 MG PO TABS: 200.0000 mg | ORAL_TABLET | Freq: Every day | ORAL | 1 refills | Status: DC

## 2022-05-12 NOTE — Patient Instructions (Signed)
Medication Instructions:  Your physician has recommended you make the following change in your medication:   Stop Hydralazine '10mg'$   Start Hydralazine '25mg'$  - 1.5 tablets (37.'5mg'$ ) by mouth 3 times a day.   *If you need a refill on your cardiac medications before your next appointment, please call your pharmacy*   Lab Work: None ordered.  If you have labs (blood work) drawn today and your tests are completely normal, you will receive your results only by: [place] (if you have MyChart) OR A paper copy in the mail If you have any lab test that is abnormal or we need to change your treatment, we will call you to review the results.   Testing/Procedures: None ordered.    Follow-Up: At [facility], you and your health needs are our priority.  As part of our continuing mission to provide you with exceptional heart care, we have created designated Provider Care Teams.  These Care Teams include your primary Cardiologist (physician) and Advanced Practice Providers (APPs -  Physician Assistants and Nurse Practitioners) who all work together to provide you with the care you need, when you need it.  We recommend signing up for the patient portal called "MyChart".  Sign up information is provided on this After Visit Summary.  MyChart is used to connect with patients for Virtual Visits (Telemedicine).  Patients are able to view lab/test results, encounter notes, upcoming appointments, etc.  Non-urgent messages can be sent to your provider as well.   To learn more about what you can do with MyChart, go to [email or web address].    Your next appointment:   6 months with Dr Mardene Speak PA

## 2022-05-12 NOTE — Telephone Encounter (Deleted)
Revlimid prescription sent with auth #.

## 2022-05-12 NOTE — Progress Notes (Signed)
Electrophysiology Office Follow up Visit Note:    Date:  05/12/2022   ID:  Travis [name], DOB 04-04-1951, MRN [id number]  PCP:  [name], MD  [facility] Cardiologist:  [name], MD  [facility] Electrophysiologist:  Vickie Epley, MD   CC: Follow-up  Interval History:    Travis [name] is a 72 y.o. male who presents for a follow up visit. He has a Hx of HTN, OSA and PAF (CHADSVASc score 4; s/p Afib ablation 2018  On flecanide).   The pt had Cardiac CTA in 2018 which showed no CAD  Calcium score was 0  Pt also has hx of DVT, peripheral neuropathy and MGUS. Earlier this fall he was in [place], hospitalized for tachycardia atrial fib/flutter. He was volume overloaded   Diuresed  Converted to SR.  B Blocker stopped as he became bradycardic   Then placed back on it. Echo done as well. On 03/15/22 she was seen in ER with afib and atypcial CP  EKG showed SR   Trop checked neg. CT of chest showed no PE. Did show lytic lesions L sixth rib  Pt given lidocaine patch. The pt was seen in afib clinc on 03/17/22 IN SR Denied CP    He was seen by Fay Records, MD on 04/20/2022  He was back in atrial flutter. Took an additional metoprolol without help.  It was recommended to stop the flecanide   Then, starting amiodarone 400 bid for 5 days going to 400 mg daily. Cut back on metoprolol to 1/2.  They were last seen in clinic 04/20/2022 by Fay Records, MD for evaluation of chest pain and tachycardia. He reported that palpitations /tachcardia had resolved   His breathing was OK. He complained of R sided  shoulder/arm pain and some cramping in L chest .    Today, he reports that his rhythm has been stable recently. He is compliant with Amiodarone 200 mg twice a day.  In regards to his myeloma, since his fracture in his right arm, he has some stiffness despite PT. He also complains of great pain in his back and shoulder muscles. This has caused an increase in his BP. He reports that he  occasionally feels like his heart is about to pump out of his chest  He checks his BP regularly at home. His systolic readings range in the 170s-180s. Since starting HCTZ 20 mg three times a day, his BP readings have improved slightly to [id number] systolic.    He denies any shortness of breath, or peripheral edema. No lightheadedness, headaches, syncope, orthopnea, or PND.    Past Medical History:  Diagnosis Date   Arthritis    "comes and goes; mostly in hands, occasionally elbow" (42/08/9561)   Complication of anesthesia    "just wanted to sleep alot  for couple days S/P VARICOCELE OR" (04/05/2017)   DVT (deep venous thrombosis) (HCC)    LLE   Dysrhythmia    PVCs    GERD (gastroesophageal reflux disease)    History of hiatal hernia    Hypertension    Impaired glucose tolerance    Multiple myeloma (HCC)    Obesity (BMI 30-39.9) 07/26/2016   OSA on CPAP 07/26/2016   Mild with AHI 12.5/hr now on CPAP at 14cm H2O   PAF (paroxysmal atrial fibrillation) ([place])    s/p PVI Ablation in 2018 // Flecainide Rx // Echo 8/21: EF 60-65, no RWMA, mild LVH, normal RV SF, moderate RVE, mild  BAE, trivial MR, mild dilation of aortic root (40 mm)   Pneumonia    "may have had walking pneumonia a few years ago" (04/05/2017)   Pre-diabetes    Thrombophlebitis     Past Surgical History:  Procedure Laterality Date   ATRIAL FIBRILLATION ABLATION  04/05/2017   ATRIAL FIBRILLATION ABLATION N/A 04/05/2017   Procedure: ATRIAL FIBRILLATION ABLATION;  Surgeon: Thompson Grayer, MD;  Location: [facility];  Service: Cardiovascular;  Laterality: N/A;   BONE BIOPSY Right 12/17/2021   Procedure: BONE BIOPSY;  Surgeon: Hiram Gash, MD;  Location: [place];  Service: Orthopedics;  Laterality: Right;   COMPLETE RIGHT HIP REPLACMENT  01/19/2019   EVALUATION UNDER ANESTHESIA WITH HEMORRHOIDECTOMY N/A 02/24/2017   Procedure: EXAM UNDER ANESTHESIA WITH  HEMORRHOIDECTOMY;  Surgeon: Michael Boston, MD;   Location: [facility];  Service: General;  Laterality: N/A;   EXCISIONAL TOTAL HIP ARTHROPLASTY WITH ANTIBIOTIC SPACERS Right 09/25/2020   Procedure: EXCISIONAL TOTAL HIP ARTHROPLASTY WITH ANTIBIOTIC SPACERS;  Surgeon: Paralee Cancel, MD;  Location: [facility];  Service: Orthopedics;  Laterality: Right;   FLEXIBLE SIGMOIDOSCOPY N/A 04/15/2015   Procedure:  UNSEDATED FLEXIBLE SIGMOIDOSCOPY;  Surgeon: [name], MD;  Location: [facility];  Service: Endoscopy;  Laterality: N/A;   HUMERUS IM NAIL Right 12/17/2021   Procedure: INTRAMEDULLARY (IM) NAIL HUMERAL;  Surgeon: Hiram Gash, MD;  Location: [place];  Service: Orthopedics;  Laterality: Right;   KNEE ARTHROSCOPY Left ~ 2016   REIMPLANTATION OF TOTAL HIP Right 12/25/2020   Procedure: REIMPLANTATION/REVISION OF RIGHT TOTAL HIP VERSUS REPEAT IRRIGATION AND DEBRIDEMENT;  Surgeon: Paralee Cancel, MD;  Location: [facility];  Service: Orthopedics;  Laterality: Right;   TESTICLE SURGERY  1988   VARICOCELE   TONSILLECTOMY     VARICOSE VEIN SURGERY Bilateral     Current Medications: Current Meds  Medication Sig   acetaminophen (TYLENOL) 325 MG tablet Take 2 tablets (650 mg total) by mouth every 6 (six) hours as needed.   acyclovir (ZOVIRAX) 400 MG tablet Take 1 tablet (400 mg total) by mouth 2 (two) times daily.   amLODipine (NORVASC) 10 MG tablet Take 10 mg by mouth daily.    Ascorbic Acid (VITAMIN C) 100 MG tablet Take 100 mg by mouth daily.   cholecalciferol (VITAMIN D3) 25 MCG (1000 UNIT) tablet Take 1,000 Units by mouth daily.   clobetasol cream (TEMOVATE) 3.38 % Apply 1 Application topically daily as needed (rash).   Cyanocobalamin (VITAMIN B-12 PO) Take 1 tablet by mouth daily. Not sure the dosage   dexamethasone (DECADRON) 4 MG tablet Take 10 tablets (40 mg total) by mouth once a week. (Patient taking differently: Take 40 mg by mouth every 14 (fourteen) days.)   ELIQUIS 5 MG TABS tablet TAKE ONE TABLET BY MOUTH TWICE A DAY    fluticasone (FLONASE) 50 MCG/ACT nasal spray Place 1 spray into both nostrils daily as needed for allergies or rhinitis.   folic acid (FOLVITE) 1 MG tablet Take 1 mg by mouth daily.   furosemide (LASIX) 40 MG tablet Take 40 mg by mouth daily as needed for fluid or edema.   gabapentin (NEURONTIN) 300 MG capsule TAKE TWO CAPSULES BY MOUTH TWICE A DAY   hydrALAZINE (APRESOLINE) 25 MG tablet Take 1.5 tablets (37.5 mg total) by mouth 3 (three) times daily.   lenalidomide (REVLIMID) 25 MG capsule Take 1 capsule (25 mg total) by mouth daily. Take 21 days on, 7 days off, repeat every 28 days.  [employer] auth # [id number] Date obtained: 05/12/21   lidocaine (LIDODERM) 5 % Place 1 patch onto the skin daily as needed. Remove & Discard patch within 12 hours or as directed by MD   lisinopril (ZESTRIL) 40 MG tablet TAKE ONE TABLET BY MOUTH ONE TIME DAILY   oxymetazoline (AFRIN) 0.05 % nasal spray Place 1 spray into both nostrils 2 (two) times daily as needed for congestion.   potassium chloride SA (KLOR-CON M) 20 MEQ tablet TAKE ONE TABLET BY MOUTH ONE TIME DAILY, TAKE 40 MG (2 TABLETS) ON DAYS THAT YOU ALSO TAKE LASIX (FUROSEMIDE) (Patient taking differently: Take 30 mEq by mouth daily.)   prochlorperazine (COMPAZINE) 10 MG tablet Take 1 tablet (10 mg total) by mouth every 6 (six) hours as needed for nausea or vomiting.   psyllium (METAMUCIL) 58.6 % packet Take 1 packet by mouth daily as needed (constipation).   sodium chloride (OCEAN) 0.65 % SOLN nasal spray Place 1 spray into both nostrils as needed for congestion.   traMADol (ULTRAM) 50 MG tablet Take 1 tablet (50 mg total) by mouth every 6 (six) hours as needed.   triamcinolone cream (KENALOG) 0.1 % Apply 1 application topically daily as needed (itching).   [DISCONTINUED] amiodarone (PACERONE) 200 MG tablet Take 200 mg twice daily   [DISCONTINUED] hydrALAZINE (APRESOLINE) 10 MG tablet Take one tablet (10 mg) by mouth up to three times a day for BP above 150 and  160.     Allergies:   Linezolid and Vancomycin   Social History   Socioeconomic History   Marital status: Married    Spouse name: Not on file   Number of children: 1   Years of education: law school   Highest education level: Not on file  Occupational History   Occupation: lawyer  Tobacco Use   Smoking status: Never   Smokeless tobacco: Never  Vaping Use   Vaping Use: Never used  Substance and Sexual Activity   Alcohol use: Yes    Alcohol/week: 2.0 standard drinks of alcohol    Types: 2 Glasses of wine per week    Comment: 2 glasses of wine a week, occ bourbon, beer   Drug use: No   Sexual activity: Not Currently  Other Topics Concern   Not on file  Social History Narrative   Lives in [place] with spouse in a 2 story home.  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    Social Determinants of Health   Financial Resource Strain: Not on file  Food Insecurity: Not on file  Transportation Needs: Not on file  Physical Activity: Not on file  Stress: Not on file  Social Connections: Not on file     Family History: The patient's family history includes Atrial fibrillation in his father; Dementia in his maternal grandmother and mother; Hypertension in his father and paternal grandfather; Stroke in his father.  ROS:   Please see the history of present illness.    (+) back/shoulder pain (+) stiffness in R arm All other systems reviewed and are negative.  EKGs/Labs/Other Studies Reviewed:    The following studies were reviewed today:  Echocardiogram 12/02/2021: IMPRESSIONS    1. Global longitudinal strain is -20.7%. Left ventricular ejection  fraction, by estimation, is 60 to 65%. The left ventricle has normal  function. The left ventricle has no regional wall motion abnormalities.  Left ventricular diastolic parameters were  normal.  2. Right ventricular systolic function is normal. The right ventricular   size is normal.   3. The mitral valve is normal in structure. Mild mitral valve  regurgitation.   4. The aortic valve is normal in structure. Aortic valve regurgitation is  not visualized.   5. The inferior vena cava is normal in size with greater than 50%  respiratory variability, suggesting right atrial pressure of 3 mmHg.    EKG:  EKG is personally reviewed.  05/12/2022: NSR.  04/20/2022: SInus bradycardia 54 bpm QTc 498   Recent Labs: 11/17/2021: NT-Pro BNP 232; TSH 1.870 04/23/2022: Magnesium 1.8 05/11/2022: ALT 27; BUN 17; Creatinine 0.82; Hemoglobin 14.5; Platelet Count 165; Potassium 3.3; Sodium 139   Recent Lipid Panel No results found for: "CHOL", "TRIG", "HDL", "CHOLHDL", "VLDL", "LDLCALC", "LDLDIRECT"  Physical Exam:    VS:  BP (!) 160/70   Pulse 62   Ht '5\' 10"'$  (1.778 m)   Wt 251 lb (113.9 kg)   SpO2 97%   BMI 36.01 kg/m     Wt Readings from Last 3 Encounters:  05/12/22 251 lb (113.9 kg)  05/04/22 255 lb 8 oz (115.9 kg)  04/20/22 254 lb (115.2 kg)     GEN: Well nourished, well developed in no acute distress HEENT: Normal NECK: No JVD CARDIAC: RRR, no murmurs, rubs, gallops RESPIRATORY:  Clear to auscultation without rales, wheezing or rhonchi  ABDOMEN: Soft, non-tender, non-distended MUSCULOSKELETAL:  No edema; No deformity  SKIN: Warm and dry NEUROLOGIC:  Alert and oriented x 3 PSYCHIATRIC:  Normal affect        ASSESSMENT:    1. Paroxysmal atrial fibrillation (HCC)    PLAN:    In order of problems listed above:  #Persistent AF Symptomatic. Doing well on amiodarone. Reduce dose to '200mg'$  PO daily. LFT's this month OK. TSH checked in July.  #Hypertension Above goal today.  Recommend checking blood pressures 1-2 times per week at home and recording the values.  Recommend bringing these recordings to the primary care physician. Increase Hydralazine to 37.'5mg'$  PO TID.  Follow-up in 6 months with APP. Labwork (CMP, TSH and FT4) at that  visit.   Medication Adjustments/Labs and Tests Ordered: Current medicines are reviewed at length with the patient today.  Concerns regarding medicines are outlined above.   Orders Placed This Encounter  Procedures   EKG 12-Lead   Meds ordered this encounter  Medications   hydrALAZINE (APRESOLINE) 25 MG tablet    Sig: Take 1.5 tablets (37.5 mg total) by mouth 3 (three) times daily.    Dispense:  405 tablet    Refill:  1    Dose Change   amiodarone (PACERONE) 200 MG tablet    Sig: Take 1 tablet (200 mg total) by mouth daily.    Dispense:  180 tablet    Refill:  1    New instructions - pt does not need rx filled at this time.    I,Mitra Faeizi,acting as a Education administrator for Vickie Epley, MD.,have documented all relevant documentation on the behalf of Vickie Epley, MD,as directed by  Vickie Epley, MD while in the presence of Vickie Epley, MD.  I, Vickie Epley, MD, have reviewed all documentation for this visit. The documentation on 05/12/22 for the exam, diagnosis, procedures, and orders are all accurate and complete.   Signed, Lars Mage, MD, [facility], [facility] 05/12/2022 10:30 PM    Electrophysiology [facility]

## 2022-05-13 ENCOUNTER — Ambulatory Visit: Payer: Self-pay

## 2022-05-13 ENCOUNTER — Ambulatory Visit (INDEPENDENT_AMBULATORY_CARE_PROVIDER_SITE_OTHER): Payer: Medicare Other

## 2022-05-13 ENCOUNTER — Ambulatory Visit: Payer: Medicare Other | Admitting: Physical Therapy

## 2022-05-13 ENCOUNTER — Ambulatory Visit: Payer: Medicare Other | Admitting: Family Medicine

## 2022-05-13 ENCOUNTER — Encounter: Payer: Self-pay | Admitting: Physical Therapy

## 2022-05-13 VITALS — BP 140/78 | HR 60 | Ht 70.0 in | Wt 249.0 lb

## 2022-05-13 DIAGNOSIS — R293 Abnormal posture: Secondary | ICD-10-CM

## 2022-05-13 DIAGNOSIS — R278 Other lack of coordination: Secondary | ICD-10-CM

## 2022-05-13 DIAGNOSIS — M25512 Pain in left shoulder: Secondary | ICD-10-CM

## 2022-05-13 DIAGNOSIS — M546 Pain in thoracic spine: Secondary | ICD-10-CM

## 2022-05-13 DIAGNOSIS — G8929 Other chronic pain: Secondary | ICD-10-CM

## 2022-05-13 DIAGNOSIS — M5030 Other cervical disc degeneration, unspecified cervical region: Secondary | ICD-10-CM

## 2022-05-13 DIAGNOSIS — M542 Cervicalgia: Secondary | ICD-10-CM

## 2022-05-13 DIAGNOSIS — M6281 Muscle weakness (generalized): Secondary | ICD-10-CM

## 2022-05-13 NOTE — Patient Instructions (Signed)
Vit D 2000IU daily Tart cherry extract '1200mg'$  at night Xray today See me in 6-8 weeks

## 2022-05-13 NOTE — Therapy (Signed)
OUTPATIENT PHYSICAL THERAPY SHOULDER TREATMENT   Patient Name: Travis [name] MRN: [id number] DOB:[birth year], 72 y.o., male Today's Date: 05/13/2022  END OF SESSION:  PT End of Session - 05/13/22 1147     Visit Number 3    Date for PT Re-Evaluation 07/28/22    PT Start Time 1145    PT Stop Time 1230    PT Time Calculation (min) 45 min    Activity Tolerance Patient tolerated treatment well    Behavior During Therapy [name] for tasks assessed/performed             Past Medical History:  Diagnosis Date   Arthritis    "comes and goes; mostly in hands, occasionally elbow" (62/10/348)   Complication of anesthesia    "just wanted to sleep alot  for couple days S/P VARICOCELE OR" (04/05/2017)   DVT (deep venous thrombosis) (HCC)    LLE   Dysrhythmia    PVCs    GERD (gastroesophageal reflux disease)    History of hiatal hernia    Hypertension    Impaired glucose tolerance    Multiple myeloma (HCC)    Obesity (BMI 30-39.9) 07/26/2016   OSA on CPAP 07/26/2016   Mild with AHI 12.5/hr now on CPAP at 14cm H2O   PAF (paroxysmal atrial fibrillation) ([place])    s/p PVI Ablation in 2018 // Flecainide Rx // Echo 8/21: EF 60-65, no RWMA, mild LVH, normal RV SF, moderate RVE, mild BAE, trivial MR, mild dilation of aortic root (40 mm)   Pneumonia    "may have had walking pneumonia a few years ago" (04/05/2017)   Pre-diabetes    Thrombophlebitis    Past Surgical History:  Procedure Laterality Date   ATRIAL FIBRILLATION ABLATION  04/05/2017   ATRIAL FIBRILLATION ABLATION N/A 04/05/2017   Procedure: ATRIAL FIBRILLATION ABLATION;  Surgeon: Thompson Grayer, MD;  Location: [facility];  Service: Cardiovascular;  Laterality: N/A;   BONE BIOPSY Right 12/17/2021   Procedure: BONE BIOPSY;  Surgeon: Hiram Gash, MD;  Location: [facility];  Service: Orthopedics;  Laterality: Right;   COMPLETE RIGHT HIP REPLACMENT  01/19/2019   EVALUATION UNDER ANESTHESIA WITH HEMORRHOIDECTOMY  N/A 02/24/2017   Procedure: EXAM UNDER ANESTHESIA WITH  HEMORRHOIDECTOMY;  Surgeon: Michael Boston, MD;  Location: [facility];  Service: General;  Laterality: N/A;   EXCISIONAL TOTAL HIP ARTHROPLASTY WITH ANTIBIOTIC SPACERS Right 09/25/2020   Procedure: EXCISIONAL TOTAL HIP ARTHROPLASTY WITH ANTIBIOTIC SPACERS;  Surgeon: Paralee Cancel, MD;  Location: [facility];  Service: Orthopedics;  Laterality: Right;   FLEXIBLE SIGMOIDOSCOPY N/A 04/15/2015   Procedure:  UNSEDATED FLEXIBLE SIGMOIDOSCOPY;  Surgeon: [name], MD;  Location: [facility];  Service: Endoscopy;  Laterality: N/A;   HUMERUS IM NAIL Right 12/17/2021   Procedure: INTRAMEDULLARY (IM) NAIL HUMERAL;  Surgeon: Hiram Gash, MD;  Location: [facility];  Service: Orthopedics;  Laterality: Right;   KNEE ARTHROSCOPY Left ~ 2016   REIMPLANTATION OF TOTAL HIP Right 12/25/2020   Procedure: REIMPLANTATION/REVISION OF RIGHT TOTAL HIP VERSUS REPEAT IRRIGATION AND DEBRIDEMENT;  Surgeon: Paralee Cancel, MD;  Location: [facility];  Service: Orthopedics;  Laterality: Right;   TESTICLE SURGERY  1988   VARICOCELE   TONSILLECTOMY     VARICOSE VEIN SURGERY Bilateral    Patient Active Problem List   Diagnosis Date Noted   Secondary hypercoagulable state ([place]) 01/11/2022   Multiple myeloma ([place]) 12/10/2021   Medication monitoring encounter 02/27/2021   S/P revision of right total hip  12/25/2020   Status post peripherally inserted central catheter (PICC) central line placement 11/06/2020   Drug rash with eosinophilia and systemic symptoms syndrome 11/06/2020   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/25/2020   PAF (paroxysmal atrial fibrillation) (HCC)    GERD (gastroesophageal reflux disease) 03/29/2018   Hiatal hernia 03/29/2018   History of DVT (deep vein thrombosis) 03/29/2018   Osteoarthritis 03/29/2018   Neuropathy 08/29/2017   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   Paroxysmal atrial fibrillation ([place]) 04/05/2017   Prolapsed  internal hemorrhoids, grade 4, s/p ligation/pexy/hemorrhoidectomy x 2 02/24/2017 02/24/2017   OSA (obstructive sleep apnea) 07/26/2016   Obesity (BMI 30-39.9) 07/26/2016   Atrial fibrillation ([place])    Hypertension    Chest pain at rest     PCP: Travis [name] a MD  REFERRING PROVIDER: Ledell Peoples, MD  REFERRING DIAG:   Codes Comments  Multiple myeloma not having achieved remission ([place])  - Primary C90.00     THERAPY DIAG:  Abnormal posture  Other lack of coordination  Muscle weakness (generalized)  Chronic right shoulder pain  Neck pain  Rationale for Evaluation and Treatment: Rehabilitation  ONSET DATE: 04/23/22  SUBJECTIVE:                                                                                                                                                                                      SUBJECTIVE STATEMENT: Pain down the spine, blow the shoulder blade on R side, and inner R arm   PERTINENT HISTORY: Travis [name] 72 y.o. male with medical history significant for IgA Kappa Multiple Myeloma who presents for a follow up visit. The patient's last visit was on 04/20/2022. In the interim since the last visit he has continued of Dara/Rev/Dex.   #Left Rib Pain # Right Shoulder Pain/Stiffness -- X-ray of the ribs show no clear issues with the left ribs. --Pain is steadily improving. --known disease in right ribs --Will make referral to physical therapy per patient request. -- Continue to monitor --Patient underwent fixation of his humerus on 12/17/2021, pathology confirmed plasmacytoma/plasma cell neoplasm.  PAIN:  Are you having pain? Yes: NPRS scale: 7/10 Pain location: R shoulder blade, spinal pain lower neck and mid back, sometimes under the R arm along lateral trunk, moves a bit. Pain description: tight muscles, bruising Aggravating factors: lifting Relieving factors: ice, heat, muscle relaxers are minimally helpful.  PRECAUTIONS: Multiple  Myeloma with mets to R ribs, R humerus with ORIF for pathological fracture 12/17/21. Lesions have also been found in R rib and also in his skull.  WEIGHT BEARING RESTRICTIONS: No specific, but he reports he still needs to limit his lifting.  FALLS:  Has patient fallen in last 6 months? No  LIVING ENVIRONMENT: Lives with: lives with their family and lives with their spouse Lives in: House/apartment Stairs: Yes: Internal: 14 steps; on left going up and External: 3 steps; on right going up bedroom on first floor, does not use them. Has following equipment at home: None  OCCUPATION: Attorney, trying to retire, finishing up a few remaining projects.  PLOF: Independent  PATIENT GOALS:relieve back pain, strengthen R arm.  NEXT MD VISIT:   OBJECTIVE:   DIAGNOSTIC FINDINGS:  Right hip arthroplasty again noted. Multilevel degenerative changes affect the spine.   IMPRESSION: 1. Lytic lesions in the bilateral humeri, skull, right radius have not significantly changed. No new lesions identified. 2. New right humeral intramedullary nail crossing a pathologic fracture in the proximal humerus.  COGNITION: Overall cognitive status: Within functional limits for tasks assessed     SENSATION: WFL  POSTURE: Severely forward head, mildly kyphotic, round shoulders, neck mildly rotated to R.  UPPER EXTREMITY ROM: WNL unless otherwise indicated.  Passive ROM Right eval Left eval  Shoulder flexion 104   Shoulder extension    Shoulder abduction 91   Shoulder adduction    Shoulder internal rotation WFL   Shoulder external rotation [facility]   Elbow flexion    Elbow extension    Wrist flexion    Wrist extension    Wrist ulnar deviation    Wrist radial deviation    Wrist pronation    Wrist supination    (Blank rows = not tested)  UPPER EXTREMITY MMT: LUE WNL, R shoulder motion all limited by pain, at least 3/5, elbow at least 4-/5   SHOULDER SPECIAL TESTS: Impingement tests:  Hawkins/Kennedy impingement test: negative Rotator cuff assessment: Empty can test: negative and Belly press test: negative   JOINT MOBILITY TESTING:  R shoulder tight in inferior and posterior glide. R scapula sits high on back, mildly adducted, stiff, decreased mobility.  PALPATION:  Very tight and TTP on R cervical paraspinals, R up traps, R peri scapular muscles, B pects.   TODAY'S TREATMENT:                                                                                                                                         DATE:  05/13/22 STM thera gun around R scapula NuStep L 5 x 6 min Standing shoulder Ext 5lb 2x10 Seated rows & lats 25lb 2x10 Chest press 10lb 2x10 Shoulder abd and Flex AROM 2x10 each  Shoulder ER red 2x10 Cervical retraction 2x10 Horiz abd 2x10     05/10/21  NuStep L3 x 6 min AAROM Shoulder 2lb WaTE flex, Ext, IR x10 Seated rows green 2x12 Seated ER Red 2x10 Horiz Abd yellow 2x10 STM to upper R T trap and rhomboid  DN performed by Lead PT M. Albright   05/05/22 Education   PATIENT EDUCATION: Education details: POC  Person educated: Patient Education method: Explanation Education comprehension: verbalized understanding  HOME EXERCISE PROGRAM: Has HEP from another therapist for R shoulder rehab after ORIF for pathological humerus fracture.  ASSESSMENT:  CLINICAL IMPRESSION: Patient is a 72 y.o. who was seen today for physical therapy treatment for R neck and shoulder pain. He has a complicated history including multiple myeloma with lesions in his R ribs, scalp, and R humerus. Today he enters with reports mid spine, and medial R arm pain. Pt has a depress and protracted R scapula at rest. Session consisted of more machine level interventions without issue. Some ROM limitation note dwiht standing abduction and flexion. Positive response with MT.      OBJECTIVE IMPAIRMENTS: decreased activity tolerance, decreased coordination, decreased ROM,  decreased strength, increased muscle spasms, impaired flexibility, improper body mechanics, postural dysfunction, and pain.   ACTIVITY LIMITATIONS: carrying, lifting, and reach over head  PARTICIPATION LIMITATIONS: meal prep, cleaning, shopping, and community activity  PERSONAL FACTORS: Age, Past/current experiences, and 1 comorbidity: mult myeloma  are also affecting patient's functional outcome.   REHAB POTENTIAL: Good  CLINICAL DECISION MAKING: Evolving/moderate complexity  EVALUATION COMPLEXITY: Moderate   GOALS: Goals reviewed with patient? Yes  SHORT TERM GOALS: Target date: 05/20/22  I with basic HEP Baseline: Goal status: INITIAL  LONG TERM GOALS: Target date: 07/28/22  I with final HEP Baseline:  Goal status: INITIAL  2.  Patient will improve painfree R shoulder ROM to at least 110 flex and abd Baseline: Painful, < 90 Goal status: INITIAL  3.  Increase R shoulder strength to at least 4-/5 with pain < 4/10 Baseline:  Goal status: INITIAL  4.  Improve pain in neck and shoulder by at least 50% Baseline: Up to 10/10 Goal status: INITIAL  PLAN:  PT FREQUENCY: 2x/week  PT DURATION: 12 weeks  PLANNED INTERVENTIONS: Therapeutic exercises, Therapeutic activity, Neuromuscular re-education, Balance training, Gait training, Patient/Family education, Self Care, Joint mobilization, Cryotherapy, Moist heat, Vasopneumatic device, Ionotophoresis '4mg'$ /ml Dexamethasone, and Manual therapy  PLAN FOR NEXT SESSION: HEP, acute pain management   Marcelina Morel, DPT 05/13/2022, 11:48 AM

## 2022-05-14 ENCOUNTER — Encounter: Payer: Self-pay | Admitting: Hematology and Oncology

## 2022-05-14 ENCOUNTER — Encounter: Payer: Self-pay | Admitting: Family Medicine

## 2022-05-14 DIAGNOSIS — M5030 Other cervical disc degeneration, unspecified cervical region: Secondary | ICD-10-CM | POA: Insufficient documentation

## 2022-05-14 LAB — MULTIPLE MYELOMA PANEL, SERUM
Albumin SerPl Elph-Mcnc: 3.8 g/dL (ref 2.9–4.4)
Albumin/Glob SerPl: 2 — ABNORMAL HIGH (ref 0.7–1.7)
Alpha 1: 0.2 g/dL (ref 0.0–0.4)
Alpha2 Glob SerPl Elph-Mcnc: 0.7 g/dL (ref 0.4–1.0)
B-Globulin SerPl Elph-Mcnc: 0.8 g/dL (ref 0.7–1.3)
Gamma Glob SerPl Elph-Mcnc: 0.2 g/dL — ABNORMAL LOW (ref 0.4–1.8)
Globulin, Total: 2 g/dL — ABNORMAL LOW (ref 2.2–3.9)
IgA: 46 mg/dL — ABNORMAL LOW (ref 61–437)
IgG (Immunoglobin G), Serum: 312 mg/dL — ABNORMAL LOW (ref 603–1613)
IgM (Immunoglobulin M), Srm: 11 mg/dL — ABNORMAL LOW (ref 15–143)
M Protein SerPl Elph-Mcnc: 0.1 g/dL — ABNORMAL HIGH
Total Protein ELP: 5.8 g/dL — ABNORMAL LOW (ref 6.0–8.5)

## 2022-05-14 NOTE — Assessment & Plan Note (Signed)
Concern with patient's posture and ergonomics.  It does appear the patient does have severe likely arthritic changes.  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  Patient has started working with physical therapy and encouraged him to continue to do so working on strengthening of the upper back muscles in the thoracic area in the scapular area that will help with stability and decreasing progression. Comparing patient's bone mets surveys from August to the most recent 1 minute does seem like there might be some mild progression on the left side that could be contributing with some of the arm pain as well. 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 May need advanced imaging if this continues to be could be a candidate for possible injections.  Do not know if manipulation will be beneficial with patient's also multiple myeloma at the moment.  Will discuss again at follow-up in 6 weeks total time talking the patient as well as reviewing imaging outside notes 47 minutes

## 2022-05-17 ENCOUNTER — Ambulatory Visit: Payer: Medicare Other | Admitting: Physical Therapy

## 2022-05-17 ENCOUNTER — Encounter: Payer: Self-pay | Admitting: Cardiology

## 2022-05-18 ENCOUNTER — Inpatient Hospital Stay: Payer: Medicare Other

## 2022-05-18 ENCOUNTER — Other Ambulatory Visit: Payer: Medicare Other

## 2022-05-18 ENCOUNTER — Inpatient Hospital Stay (HOSPITAL_BASED_OUTPATIENT_CLINIC_OR_DEPARTMENT_OTHER): Payer: Medicare Other | Admitting: Physician Assistant

## 2022-05-18 ENCOUNTER — Other Ambulatory Visit: Payer: Self-pay

## 2022-05-18 VITALS — BP 141/59 | HR 58 | Temp 98.2°F | Resp 17 | Ht 70.0 in | Wt 246.5 lb

## 2022-05-18 VITALS — BP 124/58 | HR 55 | Resp 17

## 2022-05-18 DIAGNOSIS — C9000 Multiple myeloma not having achieved remission: Secondary | ICD-10-CM

## 2022-05-18 DIAGNOSIS — E876 Hypokalemia: Secondary | ICD-10-CM | POA: Diagnosis not present

## 2022-05-18 DIAGNOSIS — Z5112 Encounter for antineoplastic immunotherapy: Secondary | ICD-10-CM | POA: Diagnosis not present

## 2022-05-18 MED ORDER — DIPHENHYDRAMINE HCL 25 MG PO CAPS
50.0000 mg | ORAL_CAPSULE | Freq: Once | ORAL | Status: AC
  Administered 2022-05-18: 50 mg via ORAL
  Filled 2022-05-18: qty 2

## 2022-05-18 MED ORDER — SODIUM CHLORIDE 0.9 % IV SOLN: Freq: Once | INTRAVENOUS | Status: AC

## 2022-05-18 MED ORDER — DARATUMUMAB-HYALURONIDASE-FIHJ 1800-30000 MG-UT/15ML ~~LOC~~ SOLN
1800.0000 mg | Freq: Once | SUBCUTANEOUS | Status: AC
  Administered 2022-05-18: 1800 mg via SUBCUTANEOUS
  Filled 2022-05-18: qty 15

## 2022-05-18 MED ORDER — ZOLEDRONIC ACID 4 MG/100ML IV SOLN
4.0000 mg | Freq: Once | INTRAVENOUS | Status: AC
  Administered 2022-05-18: 4 mg via INTRAVENOUS
  Filled 2022-05-18: qty 100

## 2022-05-18 MED ORDER — ACETAMINOPHEN 325 MG PO TABS
650.0000 mg | ORAL_TABLET | Freq: Once | ORAL | Status: AC
  Administered 2022-05-18: 650 mg via ORAL
  Filled 2022-05-18: qty 2

## 2022-05-18 MED ORDER — TRAMADOL HCL 50 MG PO TABS: 50.0000 mg | ORAL_TABLET | Freq: Four times a day (QID) | ORAL | 0 refills | Status: DC | PRN

## 2022-05-18 NOTE — Telephone Encounter (Signed)
REcomm that patient increase hydralazine to 50 mg tid    COntnue to follow BP at home

## 2022-05-18 NOTE — Progress Notes (Signed)
[place] [phone]   [phone]) [phone]  PROGRESS NOTE  Patient Care Team: [name], MD as PCP - General (Internal Medicine) Fay Records, MD as PCP - Cardiology (Cardiology) Sueanne Margarita, MD as PCP - Sleep Medicine (Cardiology) Vickie Epley, MD as PCP - Electrophysiology (Cardiology) Michael Boston, MD as Consulting Physician (General Surgery) Clarene Essex, MD as Consulting Physician (Gastroenterology) Sueanne Margarita, MD as Consulting Physician (Sleep Medicine) Alda Berthold, DO as Consulting Physician (Neurology)  Hematological/Oncological History # IgA Kappa Multiple Myeloma 05/05/2020: Bone marrow biopsy showed increased number of plasma cells representing 14% of all cells in the aspirate associated with numerous variably sized clusters in the clot and biopsy sections 04/10/2021: M protein 0.3, IgA kappa specificity. Kappa 580.5, Labda 7.0, ratio 82.93.   12/07/2021: Labs show of 1971.3, lambda 5.5, ratio 358.42 12/10/2021: Transition care to Dr. Lorenso Courier. 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  01/26/2022: Cycle 2 Day 1 of Dara/Rev/Dex 02/22/2022: Cycle 3 Day 1 of Dara/Rev/Dex 03/23/2022: Cycle 4 Day 1 of Dara/Rev/Dex 04/20/2022: Cycle 5 Day 1 of Dara/Rev/Dex 05/18/2022: Cycle 6 Day 1 of Dara/Rev/Dex  Interval History:  Travis [name] 72 y.o. male with medical history significant for IgA Kappa Multiple Myeloma who presents for a follow up visit. The patient's last visit was on 05/04/2022. In the interim since the last visit he has continued of Dara/Rev/Dex.  On exam today Travis [name] reports he continues to struggle with back and shoulder pain, right greater than left. He  was evaluated by sports medicine team last week and underwent xrays. He reports the pain is worse with ambulation and does interfere  with his ADLs. He has a great appetite and weight is overall stable. He denies nausea, vomiting or abdominal pain. He has occasional episodes of diarrhea versus constipation. He stopped taking metamucil but plans to restart that. He denies easy bruising or signs of active bleeding. He denies any fevers, chills, sweats, shortness of breath, chest pain.  A full 10 point ROS is listed below.  Overall he is willing and able to proceed with treatment today.  MEDICAL HISTORY:  Past Medical History:  Diagnosis Date   Arthritis    "comes and goes; mostly in hands, occasionally elbow" (24/08/8097)   Complication of anesthesia    "just wanted to sleep alot  for couple days S/P VARICOCELE OR" (04/05/2017)   DVT (deep venous thrombosis) (HCC)    LLE   Dysrhythmia    PVCs    GERD (gastroesophageal reflux disease)    History of hiatal hernia    Hypertension    Impaired glucose tolerance    Multiple myeloma (HCC)    Obesity (BMI 30-39.9) 07/26/2016   OSA on CPAP 07/26/2016   Mild with AHI 12.5/hr now on CPAP at 14cm H2O   PAF (paroxysmal atrial fibrillation) ([place])    s/p PVI Ablation in 2018 // Flecainide Rx // Echo 8/21: EF 60-65, no RWMA, mild LVH, normal RV SF, moderate RVE, mild BAE, trivial MR, mild dilation of aortic root (40 mm)   Pneumonia    "may have had walking pneumonia a few years ago" (04/05/2017)   Pre-diabetes    Thrombophlebitis     SURGICAL HISTORY: Past Surgical History:  Procedure Laterality Date   ATRIAL FIBRILLATION ABLATION  04/05/2017   ATRIAL FIBRILLATION ABLATION N/A 04/05/2017   Procedure: ATRIAL FIBRILLATION ABLATION;  Surgeon: Thompson Grayer, MD;  Location: [facility];  Service: Cardiovascular;  Laterality: N/A;   BONE BIOPSY Right 12/17/2021   Procedure: BONE BIOPSY;  Surgeon: Hiram Gash, MD;  Location: [facility];  Service: Orthopedics;  Laterality: Right;   COMPLETE RIGHT HIP REPLACMENT  01/19/2019   EVALUATION UNDER ANESTHESIA WITH  HEMORRHOIDECTOMY N/A 02/24/2017   Procedure: EXAM UNDER ANESTHESIA WITH  HEMORRHOIDECTOMY;  Surgeon: Michael Boston, MD;  Location: [facility];  Service: General;  Laterality: N/A;   EXCISIONAL TOTAL HIP ARTHROPLASTY WITH ANTIBIOTIC SPACERS Right 09/25/2020   Procedure: EXCISIONAL TOTAL HIP ARTHROPLASTY WITH ANTIBIOTIC SPACERS;  Surgeon: Paralee Cancel, MD;  Location: [facility];  Service: Orthopedics;  Laterality: Right;   FLEXIBLE SIGMOIDOSCOPY N/A 04/15/2015   Procedure:  UNSEDATED FLEXIBLE SIGMOIDOSCOPY;  Surgeon: [name], MD;  Location: [facility];  Service: Endoscopy;  Laterality: N/A;   HUMERUS IM NAIL Right 12/17/2021   Procedure: INTRAMEDULLARY (IM) NAIL HUMERAL;  Surgeon: Hiram Gash, MD;  Location: [facility];  Service: Orthopedics;  Laterality: Right;   KNEE ARTHROSCOPY Left ~ 2016   REIMPLANTATION OF TOTAL HIP Right 12/25/2020   Procedure: REIMPLANTATION/REVISION OF RIGHT TOTAL HIP VERSUS REPEAT IRRIGATION AND DEBRIDEMENT;  Surgeon: Paralee Cancel, MD;  Location: [facility];  Service: Orthopedics;  Laterality: Right;   TESTICLE SURGERY  1988   VARICOCELE   TONSILLECTOMY     VARICOSE VEIN SURGERY Bilateral     SOCIAL HISTORY: Social History   Socioeconomic History   Marital status: Married    Spouse name: Not on file   Number of children: 1   Years of education: law school   Highest education level: Not on file  Occupational History   Occupation: lawyer  Tobacco Use   Smoking status: Never   Smokeless tobacco: Never  Vaping Use   Vaping Use: Never used  Substance and Sexual Activity   Alcohol use: Yes    Alcohol/week: 2.0 standard drinks of alcohol    Types: 2 Glasses of wine per week    Comment: 2 glasses of wine a week, occ bourbon, beer   Drug use: No   Sexual activity: Not Currently  Other Topics Concern   Not on file  Social History Narrative   Lives in [place] with spouse in a 2 story home.  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 1 daughter.     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    Social Determinants of Health   Financial Resource Strain: Not on file  Food Insecurity: Not on file  Transportation Needs: Not on file  Physical Activity: Not on file  Stress: Not on file  Social Connections: Not on file  Intimate Partner Violence: Not on file    FAMILY HISTORY: Family History  Problem Relation Age of Onset   Dementia Mother    Stroke Father    Atrial fibrillation Father    Hypertension Father    Hypertension Paternal Grandfather    Dementia Maternal Grandmother     ALLERGIES:  is allergic to linezolid and vancomycin.  MEDICATIONS:  Current Outpatient Medications  Medication Sig Dispense Refill   acetaminophen (TYLENOL) 325 MG tablet Take 2 tablets (650 mg total) by mouth every 6 (six) hours as needed. 36 tablet 0   acyclovir (ZOVIRAX) 400 MG tablet Take 1 tablet (400 mg total) by mouth 2 (two) times daily. 180 tablet 3   amiodarone (PACERONE) 200 MG tablet Take 1 tablet (200 mg  total) by mouth daily. 180 tablet 1   amLODipine (NORVASC) 10 MG tablet Take 10 mg by mouth daily.      Ascorbic Acid (VITAMIN C) 100 MG tablet Take 100 mg by mouth daily.     cholecalciferol (VITAMIN D3) 25 MCG (1000 UNIT) tablet Take 1,000 Units by mouth daily.     clobetasol cream (TEMOVATE) 4.54 % Apply 1 Application topically daily as needed (rash).     Cyanocobalamin (VITAMIN B-12 PO) Take 1 tablet by mouth daily. Not sure the dosage     dexamethasone (DECADRON) 4 MG tablet Take 10 tablets (40 mg total) by mouth once a week. (Patient taking differently: Take 40 mg by mouth every 14 (fourteen) days.) 40 tablet 5   ELIQUIS 5 MG TABS tablet TAKE ONE TABLET BY MOUTH TWICE A DAY 60 tablet 6   fluticasone (FLONASE) 50 MCG/ACT nasal spray Place 1 spray into both nostrils daily as needed for allergies or rhinitis.     folic acid (FOLVITE) 1 MG tablet Take 1 mg by mouth daily.     furosemide (LASIX) 40 MG tablet Take 40 mg by  mouth daily as needed for fluid or edema.     gabapentin (NEURONTIN) 300 MG capsule TAKE TWO CAPSULES BY MOUTH TWICE A DAY 360 capsule 1   hydrALAZINE (APRESOLINE) 25 MG tablet Take 1.5 tablets (37.5 mg total) by mouth 3 (three) times daily. 405 tablet 1   lenalidomide (REVLIMID) 25 MG capsule Take 1 capsule (25 mg total) by mouth daily. Take 21 days on, 7 days off, repeat every 28 days. [employer] auth # [id number] Date obtained: 05/12/21 21 capsule 0   lidocaine (LIDODERM) 5 % Place 1 patch onto the skin daily as needed. Remove & Discard patch within 12 hours or as directed by MD 15 patch 0   lisinopril (ZESTRIL) 40 MG tablet TAKE ONE TABLET BY MOUTH ONE TIME DAILY 90 tablet 3   oxymetazoline (AFRIN) 0.05 % nasal spray Place 1 spray into both nostrils 2 (two) times daily as needed for congestion.     potassium chloride SA (KLOR-CON M) 20 MEQ tablet TAKE ONE TABLET BY MOUTH ONE TIME DAILY, TAKE 40 MG (2 TABLETS) ON DAYS THAT YOU ALSO TAKE LASIX (FUROSEMIDE) (Patient taking differently: Take 30 mEq by mouth daily.) 135 tablet 3   prochlorperazine (COMPAZINE) 10 MG tablet Take 1 tablet (10 mg total) by mouth every 6 (six) hours as needed for nausea or vomiting. 30 tablet 0   psyllium (METAMUCIL) 58.6 % packet Take 1 packet by mouth daily as needed (constipation).     sodium chloride (OCEAN) 0.65 % SOLN nasal spray Place 1 spray into both nostrils as needed for congestion.     triamcinolone cream (KENALOG) 0.1 % Apply 1 application topically daily as needed (itching).     traMADol (ULTRAM) 50 MG tablet Take 1 tablet (50 mg total) by mouth every 6 (six) hours as needed. 30 tablet 0   No current facility-administered medications for this visit.   Facility-Administered Medications Ordered in Other Visits  Medication Dose Route Frequency Provider Last Rate Last Admin   0.9 %  sodium chloride infusion   Intravenous Once Orson Slick, MD       Zoledronic Acid (ZOMETA) IVPB 4 mg  4 mg Intravenous Once  Orson Slick, MD        REVIEW OF SYSTEMS:   Constitutional: ( - ) fevers, ( - )  chills , ( - ) night  sweats Eyes: ( - ) blurriness of vision, ( - ) double vision, ( - ) watery eyes Ears, nose, mouth, throat, and face: ( - ) mucositis, ( - ) sore throat Respiratory: ( - ) cough, ( - ) dyspnea, ( - ) wheezes Cardiovascular: ( - ) palpitation, ( - ) chest discomfort, ( - ) lower extremity swelling Gastrointestinal:  ( - ) nausea, ( - ) heartburn, ( - ) change in bowel habits Skin: ( - ) abnormal skin rashes Lymphatics: ( - ) new lymphadenopathy, ( - ) easy bruising Neurological: ( - ) numbness, ( - ) tingling, ( - ) new weaknesses Behavioral/Psych: ( - ) mood change, ( - ) new changes  All other systems were reviewed with the patient and are negative.  PHYSICAL EXAMINATION: ECOG PERFORMANCE STATUS: 1 - Symptomatic but completely ambulatory  Vitals:   05/18/22 1106  BP: (!) 141/59  Pulse: (!) 58  Resp: 17  Temp: 98.2 F (36.8 C)  SpO2: 97%    Filed Weights   05/18/22 1106  Weight: 246 lb 8 oz (111.8 kg)     GENERAL: Well-appearing elderly Caucasian male, alert, no distress and comfortable SKIN: no overt abnormalities of the skin.  EYES: conjunctiva are pink and non-injected, sclera clear LUNGS: clear to auscultation and percussion with normal breathing effort HEART: regular rate & rhythm and no murmurs and no lower extremity edema Musculoskeletal: no cyanosis of digits and no clubbing  PSYCH: alert & oriented x 3, fluent speech NEURO: no focal motor/sensory deficits  LABORATORY DATA:  I have reviewed the data as listed    Latest Ref Rng & Units 05/11/2022   12:50 PM 05/04/2022    9:44 AM 04/20/2022    9:34 AM  CBC  WBC 4.0 - 10.5 K/uL 9.5  9.4  5.1   Hemoglobin 13.0 - 17.0 g/dL 14.5  14.0  14.3   Hematocrit 39.0 - 52.0 % 41.4  39.8  41.1   Platelets 150 - 400 K/uL 165  182  166        Latest Ref Rng & Units 05/11/2022   12:50 PM 05/04/2022    9:44 AM 04/23/2022    12:41 PM  CMP  Glucose 70 - 99 mg/dL 105  93  102   BUN 8 - 23 mg/dL '17  13  14   '$ Creatinine 0.61 - 1.24 mg/dL 0.82  0.83  0.75   Sodium 135 - 145 mmol/L 139  142  141   Potassium 3.5 - 5.1 mmol/L 3.3  3.6  3.5   Chloride 98 - 111 mmol/L 103  104  99   CO2 22 - 32 mmol/L 30  34  27   Calcium 8.9 - 10.3 mg/dL 9.3  9.5  9.0   Total Protein 6.5 - 8.1 g/dL 6.5  6.0  5.8   Total Bilirubin 0.3 - 1.2 mg/dL 0.4  0.4  0.3   Alkaline Phos 38 - 126 U/L 111  103  137   AST 15 - 41 U/L '17  16  19   '$ ALT 0 - 44 U/L '27  21  27     '$ Lab Results  Component Value Date   MPROTEIN 0.1 (H) 05/11/2022   MPROTEIN 0.1 (H) 04/20/2022   MPROTEIN 0.1 (H) 03/23/2022   Lab Results  Component Value Date   KPAFRELGTCHN 197.9 (H) 05/11/2022   KPAFRELGTCHN 287.6 (H) 04/20/2022   KPAFRELGTCHN 240.0 (H) 03/23/2022   LAMBDASER 2.6 (L) 05/11/2022  LAMBDASER 3.1 (L) 04/20/2022   LAMBDASER 3.1 (L) 03/23/2022   KAPLAMBRATIO 76.12 (H) 05/11/2022   KAPLAMBRATIO 92.77 (H) 04/20/2022   KAPLAMBRATIO 77.42 (H) 03/23/2022     RADIOGRAPHIC STUDIES: DG Thoracic Spine 2 View  Result Date: 05/15/2022 CLINICAL DATA:  Neck/upper back pain. EXAM: THORACIC SPINE 2 VIEWS COMPARISON:  Cervical spine radiographs-earlier same day FINDINGS: Evaluation of the superior aspect of the thoracic spine is degraded secondary to overlying osseous and soft tissue structures. Normal alignment of the thoracic spine. No definite anterolisthesis or retrolisthesis. No significant scoliotic curvature. Thoracic vertebral body heights appear preserved. 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 Limited visualization of the adjacent thorax is normal given obliquity and technique. Regional soft tissues appear normal. 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 Electronically Signed   By: Sandi Mariscal M.D.   On: 05/15/2022 15:43   DG Cervical Spine 2 or 3 views  Result Date: 05/15/2022 CLINICAL DATA:  Neck/upper thoracic spine pain.  EXAM: CERVICAL SPINE - 2-3 VIEW COMPARISON:  Thoracic spine radiographs-earlier same day FINDINGS: C1 to the superior endplate of C5 is seen on the provided radiograph with suboptimal visualization of the lower cervical spine as well as the cervicothoracic junction on the provided swimmer's radiograph. Accentuated cervical lordosis. Provided open mouth odontoid radiograph is degraded due to obliquity and overlying osseous structures. Visualized cervical vertebral body heights appear preserved. Prevertebral soft tissues appear normal. Mild DDD of C4-C5 with disc space height loss, endplate irregularity and anteriorly directed disc osteophyte complex at this location. Regional soft tissues appear normal. Limited visualization of the lung apices is normal given obliquity and technique. IMPRESSION: 1. Degraded examination secondary to patient's inability to tolerate standard positioning. 2. Suspected mild DDD of C4-C5. Further evaluation with cervical spine MRI could be performed as indicated. Electronically Signed   By: Sandi Mariscal M.D.   On: 05/15/2022 15:41    ASSESSMENT & PLAN Travis [name] is a 72 y.o. male with medical history significant for IgA Kappa Multiple Myeloma who presents for a follow up visit.   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  He meets diagnostic criteria by having a serum free light chain ratio greater than 100 and lytic lesions causing pathological fracture of the humerus.  Given this I would recommend we pursued Darzalex, Revlimid, and dexamethasone.  Given his advanced age I would not recommend transplantation, that this is something we can consider when it comes time to transition to maintenance therapy versus transplant.  The patient voices understanding of the plan moving forward.  # IgA Kappa Multiple Myeloma --Metastatic survey completed 12/15/2021 --Patient underwent  fixation of his humerus on 12/17/2021, pathology confirmed plasmacytoma/plasma cell neoplasm.  --Cycle 1 Day 1 of  Darzalex, Revlimid, and dexamethasone started on 12/28/2021.  Plan: --labs from 05/11/2021 showed white blood cell 9.5, hemoglobin 14.5, MCV 93.2, and platelets of 165 --restaging labs from 05/11/2021 with SPEP and SFLC showed M protein is stable at 0.1 with improvement of kappa light chains to 197.9.  --today is Cycle 6 Day 1 of Dara/Dex/Revlimid --RTC in 2 weeks with interval q 2 week treatments  # Rash Concerning for Shingles- improving -- Prescribed valacyclovir 3 times daily 1000 mg x 10 days-completed the course -- We will continue to monitor rash closely.  #Left Rib Pain # Right Shoulder Pain/Stiffness -- X-ray of the ribs show no clear issues with the left ribs. --  Undergoing physical therapy twice a week --Recently established care with Dr. Charlann Boxer in [place] who recommended vitamin D and tart cherry extract supplements.  --Will reach out to Dr. Tamala Julian to discuss if advanced imaging such as MRI is indicated at this time.  --He takes tylenol during the day and tramadol for breakthrough pain at night.   #Hypokalemia: --Mild and potassium level was 3.3 on 05/11/22 --Recommend to take potassium chloride 20 mEq twice daily as prescribed.   #Supportive Care -- chemotherapy education complete -- port placement not required -- zofran '8mg'$  q8H PRN and compazine '10mg'$  PO q6H for nausea -- acyclovir '400mg'$  PO BID for VCZ prophylaxis -- Plan to start Zometa q 12 weeks today. Dental clearance has been obtained.    No orders of the defined types were placed in this encounter.   All questions were answered. The patient knows to call the clinic with any problems, questions or concerns.  A total of more than 30 minutes were spent on this encounter with face-to-face time and non-face-to-face time, including preparing to see the patient, ordering tests and/or medications,  counseling the patient and coordination of care as outlined above.   Dede Query PA-C Dept of Hematology and [place] at [facility] Phone: [phone]   05/18/2022 1:33 PM

## 2022-05-18 NOTE — Progress Notes (Signed)
Pt reports taking dex at home prior to arrival today  Per [place], [place] ok to proceed with labs from 05/11/2022

## 2022-05-19 ENCOUNTER — Encounter: Payer: Self-pay | Admitting: Physical Therapy

## 2022-05-19 ENCOUNTER — Ambulatory Visit: Payer: Medicare Other | Admitting: Physical Therapy

## 2022-05-19 ENCOUNTER — Telehealth: Payer: Self-pay | Admitting: Hematology and Oncology

## 2022-05-19 DIAGNOSIS — G8929 Other chronic pain: Secondary | ICD-10-CM

## 2022-05-19 DIAGNOSIS — M6281 Muscle weakness (generalized): Secondary | ICD-10-CM

## 2022-05-19 DIAGNOSIS — R278 Other lack of coordination: Secondary | ICD-10-CM

## 2022-05-19 DIAGNOSIS — M542 Cervicalgia: Secondary | ICD-10-CM

## 2022-05-19 DIAGNOSIS — R293 Abnormal posture: Secondary | ICD-10-CM

## 2022-05-19 NOTE — Telephone Encounter (Signed)
Called patient to confirm upcoming appointment. Patient notified.

## 2022-05-19 NOTE — Therapy (Signed)
OUTPATIENT PHYSICAL THERAPY SHOULDER TREATMENT   Patient Name: Travis [name] MRN: [id number] DOB:August 10, 1950, 72 y.o., male Today's Date: 05/19/2022  END OF SESSION:  PT End of Session - 05/19/22 1022     Visit Number 4    Date for PT Re-Evaluation 07/28/22    PT Start Time 1018    PT Stop Time 1058    PT Time Calculation (min) 40 min    Activity Tolerance Patient tolerated treatment well    Behavior During Therapy [facility] for tasks assessed/performed             Past Medical History:  Diagnosis Date   Arthritis    "comes and goes; mostly in hands, occasionally elbow" (50/07/5463)   Complication of anesthesia    "just wanted to sleep alot  for couple days S/P VARICOCELE OR" (04/05/2017)   DVT (deep venous thrombosis) (HCC)    LLE   Dysrhythmia    PVCs    GERD (gastroesophageal reflux disease)    History of hiatal hernia    Hypertension    Impaired glucose tolerance    Multiple myeloma (HCC)    Obesity (BMI 30-39.9) 07/26/2016   OSA on CPAP 07/26/2016   Mild with AHI 12.5/hr now on CPAP at 14cm H2O   PAF (paroxysmal atrial fibrillation) ([place])    s/p PVI Ablation in 2018 // Flecainide Rx // Echo 8/21: EF 60-65, no RWMA, mild LVH, normal RV SF, moderate RVE, mild BAE, trivial MR, mild dilation of aortic root (40 mm)   Pneumonia    "may have had walking pneumonia a few years ago" (04/05/2017)   Pre-diabetes    Thrombophlebitis    Past Surgical History:  Procedure Laterality Date   ATRIAL FIBRILLATION ABLATION  04/05/2017   ATRIAL FIBRILLATION ABLATION N/A 04/05/2017   Procedure: ATRIAL FIBRILLATION ABLATION;  Surgeon: Thompson Grayer, MD;  Location: [place] [facility];  Service: Cardiovascular;  Laterality: N/A;   BONE BIOPSY Right 12/17/2021   Procedure: BONE BIOPSY;  Surgeon: Hiram Gash, MD;  Location: [place];  Service: Orthopedics;  Laterality: Right;   COMPLETE RIGHT HIP REPLACMENT  01/19/2019   EVALUATION UNDER ANESTHESIA WITH HEMORRHOIDECTOMY  N/A 02/24/2017   Procedure: EXAM UNDER ANESTHESIA WITH  HEMORRHOIDECTOMY;  Surgeon: Michael Boston, MD;  [facility];  Service: General;  Laterality: N/A;   EXCISIONAL TOTAL HIP ARTHROPLASTY WITH ANTIBIOTIC SPACERS Right 09/25/2020   Procedure: EXCISIONAL TOTAL HIP ARTHROPLASTY WITH ANTIBIOTIC SPACERS;  Surgeon: Paralee Cancel, MD;  [facility];  Service: Orthopedics;  Laterality: Right;   FLEXIBLE SIGMOIDOSCOPY N/A 04/15/2015   Procedure:  UNSEDATED FLEXIBLE SIGMOIDOSCOPY;  Surgeon: [name], MD;  [facility];  Service: Endoscopy;  Laterality: N/A;   HUMERUS IM NAIL Right 12/17/2021   Procedure: INTRAMEDULLARY (IM) NAIL HUMERAL;  Surgeon: Hiram Gash, MD;  Location: [place];  Service: Orthopedics;  Laterality: Right;   KNEE ARTHROSCOPY Left ~ 2016   REIMPLANTATION OF TOTAL HIP Right 12/25/2020   Procedure: REIMPLANTATION/REVISION OF RIGHT TOTAL HIP VERSUS REPEAT IRRIGATION AND DEBRIDEMENT;  Surgeon: Paralee Cancel, MD;  [facility];  Service: Orthopedics;  Laterality: Right;   TESTICLE SURGERY  1988   VARICOCELE   TONSILLECTOMY     VARICOSE VEIN SURGERY Bilateral    Patient Active Problem List   Diagnosis Date Noted   Degenerative cervical disc 05/14/2022   Secondary hypercoagulable state ([place]) 01/11/2022   Multiple myeloma ([place]) 12/10/2021   Medication monitoring encounter 02/27/2021  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   Status post peripherally inserted central catheter (PICC) central line placement 11/06/2020   Drug rash with eosinophilia and systemic symptoms syndrome 11/06/2020   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[place]) 09/25/2020   PAF (paroxysmal atrial fibrillation) (HCC)    GERD (gastroesophageal reflux disease) 03/29/2018   Hiatal hernia 03/29/2018   History of DVT (deep vein thrombosis) 03/29/2018   Osteoarthritis 03/29/2018   Neuropathy 08/29/2017   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   Paroxysmal atrial  fibrillation ([place]) 04/05/2017   Prolapsed internal hemorrhoids, grade 4, s/p ligation/pexy/hemorrhoidectomy x 2 02/24/2017 02/24/2017   OSA (obstructive sleep apnea) 07/26/2016   Obesity (BMI 30-39.9) 07/26/2016   Atrial fibrillation ([place])    Hypertension    Chest pain at rest     PCP: Okey Dupre a MD  REFERRING PROVIDER: Ledell Peoples, MD  REFERRING DIAG:   Codes Comments  Multiple myeloma not having achieved remission ([place])  - Primary C90.00     THERAPY DIAG:  Abnormal posture  Other lack of coordination  Muscle weakness (generalized)  Neck pain  Chronic right shoulder pain  Rationale for Evaluation and Treatment: Rehabilitation  ONSET DATE: 04/23/22  SUBJECTIVE:                                                                                                                                                                                      SUBJECTIVE STATEMENT: Patient reports that he feels a little better today. He takes Steroids when he has chemo. His Oncologist is concerned that his pain may be coming from his neck. He is contacting the  Sports Medicine Dr to request an MRI.  PERTINENT HISTORY: Travis [name] 72 y.o. male with medical history significant for IgA Kappa Multiple Myeloma who presents for a follow up visit. The patient's last visit was on 04/20/2022. In the interim since the last visit he has continued of Dara/Rev/Dex.   #Left Rib Pain # Right Shoulder Pain/Stiffness -- X-ray of the ribs show no clear issues with the left ribs. --Pain is steadily improving. --known disease in right ribs --Will make referral to physical therapy per patient request. -- Continue to monitor --Patient underwent fixation of his humerus on 12/17/2021, pathology confirmed plasmacytoma/plasma cell neoplasm.  PAIN:  Are you having pain? Yes: NPRS scale: 7/10 Pain location: R shoulder blade, spinal pain lower neck and mid back, sometimes under the R arm along  lateral trunk, moves a bit. Pain description: tight muscles, bruising Aggravating factors: lifting Relieving factors: ice, heat, muscle relaxers are minimally helpful.  PRECAUTIONS: Multiple Myeloma with mets to R ribs, R humerus with ORIF for pathological  fracture 12/17/21. Lesions have also been found in R rib and also in his skull.  WEIGHT BEARING RESTRICTIONS: No specific, but he reports he still needs to limit his lifting.  FALLS:  Has patient fallen in last 6 months? No  LIVING ENVIRONMENT: Lives with: lives with their family and lives with their spouse Lives in: House/apartment Stairs: Yes: Internal: 14 steps; on left going up and External: 3 steps; on right going up bedroom on first floor, does not use them. Has following equipment at home: None  OCCUPATION: Attorney, trying to retire, finishing up a few remaining projects.  PLOF: Independent  PATIENT GOALS:relieve back pain, strengthen R arm.  NEXT MD VISIT:   OBJECTIVE:   DIAGNOSTIC FINDINGS:  Right hip arthroplasty again noted. Multilevel degenerative changes affect the spine.   IMPRESSION: 1. Lytic lesions in the bilateral humeri, skull, right radius have not significantly changed. No new lesions identified. 2. New right humeral intramedullary nail crossing a pathologic fracture in the proximal humerus.  COGNITION: Overall cognitive status: Within functional limits for tasks assessed     SENSATION: WFL  POSTURE: Severely forward head, mildly kyphotic, round shoulders, neck mildly rotated to R.  UPPER EXTREMITY ROM: WNL unless otherwise indicated.  Passive ROM Right eval Left eval  Shoulder flexion 104   Shoulder extension    Shoulder abduction 91   Shoulder adduction    Shoulder internal rotation WFL   Shoulder external rotation [facility]   Elbow flexion    Elbow extension    Wrist flexion    Wrist extension    Wrist ulnar deviation    Wrist radial deviation    Wrist pronation    Wrist supination     (Blank rows = not tested)  UPPER EXTREMITY MMT: LUE WNL, R shoulder motion all limited by pain, at least 3/5, elbow at least 4-/5   SHOULDER SPECIAL TESTS: Impingement tests: Hawkins/Kennedy impingement test: negative Rotator cuff assessment: Empty can test: negative and Belly press test: negative   JOINT MOBILITY TESTING:  R shoulder tight in inferior and posterior glide. R scapula sits high on back, mildly adducted, stiff, decreased mobility.  PALPATION:  Very tight and TTP on R cervical paraspinals, R up traps, R peri scapular muscles, B pects.   TODAY'S TREATMENT:                                                                                                                                         DATE:  05/19/22 NuStep L5 x 6 min STM to R media scapular muscles, pects, scapular mobs followed by stretch STM and stretch for ant scalenes, especially R, with stretch Seated weight shift in rotation over B hands on the mat, 3 reps each direction Standing shoulder ext and rows with 5#, 2 x 10 reps Standing shoulder ER against G tband resistance. 2 x 10 reps  05/13/22 STM thera gun around R  scapula NuStep L 5 x 6 min Standing shoulder Ext 5lb 2x10 Seated rows & lats 25lb 2x10 Chest press 10lb 2x10 Shoulder abd and Flex AROM 2x10 each  Shoulder ER red 2x10 Cervical retraction 2x10 Horiz abd 2x10  05/10/21  NuStep L3 x 6 min AAROM Shoulder 2lb WaTE flex, Ext, IR x10 Seated rows green 2x12 Seated ER Red 2x10 Horiz Abd yellow 2x10 STM to upper R T trap and rhomboid  DN performed by Lead PT M. Albright   05/05/22 Education   PATIENT EDUCATION: Education details: POC Person educated: Patient Education method: Explanation Education comprehension: verbalized understanding  HOME EXERCISE PROGRAM: Has HEP from another therapist for R shoulder rehab after ORIF for pathological humerus fracture.  ASSESSMENT:  CLINICAL IMPRESSION: Patient reports continued pain in his R  distal/medial scapula. Performed extensive STM and stretch to all ant and post muscles in neck and R shoulder. Patient then performed strengthening for postural muscles. Tolerated all well, slightly more upright posture and less shoulder rounding after treatment.   OBJECTIVE IMPAIRMENTS: decreased activity tolerance, decreased coordination, decreased ROM, decreased strength, increased muscle spasms, impaired flexibility, improper body mechanics, postural dysfunction, and pain.   ACTIVITY LIMITATIONS: carrying, lifting, and reach over head  PARTICIPATION LIMITATIONS: meal prep, cleaning, shopping, and community activity  PERSONAL FACTORS: Age, Past/current experiences, and 1 comorbidity: mult myeloma  are also affecting patient's functional outcome.   REHAB POTENTIAL: Good  CLINICAL DECISION MAKING: Evolving/moderate complexity  EVALUATION COMPLEXITY: Moderate   GOALS: Goals reviewed with patient? Yes  SHORT TERM GOALS: Target date: 05/20/22  I with basic HEP Baseline: Goal status: met  LONG TERM GOALS: Target date: 07/28/22  I with final HEP Baseline:  Goal status: ongoing  2.  Patient will improve painfree R shoulder ROM to at least 110 flex and abd Baseline: Painful, < 90 Goal status: ongoing  3.  Increase R shoulder strength to at least 4-/5 with pain < 4/10 Baseline:  Goal status: INITIAL  4.  Improve pain in neck and shoulder by at least 50% Baseline: Up to 10/10 Goal status: INITIAL  PLAN:  PT FREQUENCY: 2x/week  PT DURATION: 12 weeks  PLANNED INTERVENTIONS: Therapeutic exercises, Therapeutic activity, Neuromuscular re-education, Balance training, Gait training, Patient/Family education, Self Care, Joint mobilization, Cryotherapy, Moist heat, Vasopneumatic device, Ionotophoresis '4mg'$ /ml Dexamethasone, and Manual therapy  PLAN FOR NEXT SESSION: HEP, acute pain management   Marcelina Morel, DPT 05/19/2022, 10:54 AM

## 2022-05-20 ENCOUNTER — Other Ambulatory Visit: Payer: Self-pay | Admitting: Physician Assistant

## 2022-05-20 DIAGNOSIS — C9000 Multiple myeloma not having achieved remission: Secondary | ICD-10-CM

## 2022-05-20 DIAGNOSIS — E876 Hypokalemia: Secondary | ICD-10-CM

## 2022-05-20 DIAGNOSIS — M542 Cervicalgia: Secondary | ICD-10-CM

## 2022-05-21 ENCOUNTER — Telehealth: Payer: Self-pay

## 2022-05-21 NOTE — Telephone Encounter (Signed)
Please notify patient that I spoke to Dr. Charlann Boxer (sports medicine) abbout his ongoing neck pain  IT he agreed with MRI so I have ordered that   Pt advised and also advised PA is pending

## 2022-05-24 ENCOUNTER — Encounter: Payer: Self-pay | Admitting: Family Medicine

## 2022-05-24 ENCOUNTER — Ambulatory Visit: Payer: Medicare Other | Admitting: Physical Therapy

## 2022-05-24 NOTE — Therapy (Deleted)
OUTPATIENT PHYSICAL THERAPY SHOULDER TREATMENT   Patient Name: Travis [name] MRN: [id number] DOB:Feb 24, 1951, 72 y.o., male Today's Date: 05/24/2022  END OF SESSION:    Past Medical History:  Diagnosis Date   Arthritis    "comes and goes; mostly in hands, occasionally elbow" (46/06/7033)   Complication of anesthesia    "just wanted to sleep alot  for couple days S/P VARICOCELE OR" (04/05/2017)   DVT (deep venous thrombosis) (HCC)    LLE   Dysrhythmia    PVCs    GERD (gastroesophageal reflux disease)    History of hiatal hernia    Hypertension    Impaired glucose tolerance    Multiple myeloma (HCC)    Obesity (BMI 30-39.9) 07/26/2016   OSA on CPAP 07/26/2016   Mild with AHI 12.5/hr now on CPAP at 14cm H2O   PAF (paroxysmal atrial fibrillation) ([place])    s/p PVI Ablation in 2018 // Flecainide Rx // Echo 8/21: EF 60-65, no RWMA, mild LVH, normal RV SF, moderate RVE, mild BAE, trivial MR, mild dilation of aortic root (40 mm)   Pneumonia    "may have had walking pneumonia a few years ago" (04/05/2017)   Pre-diabetes    Thrombophlebitis    Past Surgical History:  Procedure Laterality Date   ATRIAL FIBRILLATION ABLATION  04/05/2017   ATRIAL FIBRILLATION ABLATION N/A 04/05/2017   Procedure: ATRIAL FIBRILLATION ABLATION;  Surgeon: Thompson Grayer, MD;  Location: [place] [facility];  Service: Cardiovascular;  Laterality: N/A;   BONE BIOPSY Right 12/17/2021   Procedure: BONE BIOPSY;  Surgeon: Hiram Gash, MD;  Location: [place];  Service: Orthopedics;  Laterality: Right;   COMPLETE RIGHT HIP REPLACMENT  01/19/2019   EVALUATION UNDER ANESTHESIA WITH HEMORRHOIDECTOMY N/A 02/24/2017   Procedure: EXAM UNDER ANESTHESIA WITH  HEMORRHOIDECTOMY;  Surgeon: Michael Boston, MD;  [facility];  Service: General;  Laterality: N/A;   EXCISIONAL TOTAL HIP ARTHROPLASTY WITH ANTIBIOTIC SPACERS Right 09/25/2020   Procedure: EXCISIONAL TOTAL HIP ARTHROPLASTY WITH ANTIBIOTIC SPACERS;   Surgeon: Paralee Cancel, MD;  [facility];  Service: Orthopedics;  Laterality: Right;   FLEXIBLE SIGMOIDOSCOPY N/A 04/15/2015   Procedure:  UNSEDATED FLEXIBLE SIGMOIDOSCOPY;  Surgeon: [name], MD;  [facility];  Service: Endoscopy;  Laterality: N/A;   HUMERUS IM NAIL Right 12/17/2021   Procedure: INTRAMEDULLARY (IM) NAIL HUMERAL;  Surgeon: Hiram Gash, MD;  Location: [place];  Service: Orthopedics;  Laterality: Right;   KNEE ARTHROSCOPY Left ~ 2016   REIMPLANTATION OF TOTAL HIP Right 12/25/2020   Procedure: REIMPLANTATION/REVISION OF RIGHT TOTAL HIP VERSUS REPEAT IRRIGATION AND DEBRIDEMENT;  Surgeon: Paralee Cancel, MD;  [facility];  Service: Orthopedics;  Laterality: Right;   TESTICLE SURGERY  1988   VARICOCELE   TONSILLECTOMY     VARICOSE VEIN SURGERY Bilateral    Patient Active Problem List   Diagnosis Date Noted   Degenerative cervical disc 05/14/2022   Secondary hypercoagulable state ([place]) 01/11/2022   Multiple myeloma ([place]) 12/10/2021   Medication monitoring encounter 02/27/2021   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   Status post peripherally inserted central catheter (PICC) central line placement 11/06/2020   Drug rash with eosinophilia and systemic symptoms syndrome 11/06/2020   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[place]) 09/25/2020   PAF (paroxysmal atrial fibrillation) (HCC)    GERD (gastroesophageal reflux disease) 03/29/2018   Hiatal hernia 03/29/2018   History of DVT (deep vein thrombosis) 03/29/2018   Osteoarthritis 03/29/2018  Neuropathy 08/29/2017   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   Paroxysmal atrial fibrillation ([place]) 04/05/2017   Prolapsed internal hemorrhoids, grade 4, s/p ligation/pexy/hemorrhoidectomy x 2 02/24/2017 02/24/2017   OSA (obstructive sleep apnea) 07/26/2016   Obesity (BMI 30-39.9) 07/26/2016   Atrial fibrillation ([place])    Hypertension    Chest pain at rest     PCP: Okey Dupre a MD  REFERRING PROVIDER: Ledell Peoples, MD  REFERRING DIAG:   Codes Comments  Multiple myeloma not having achieved remission ([place])  - Primary C90.00     THERAPY DIAG:  No diagnosis found.  Rationale for Evaluation and Treatment: Rehabilitation  ONSET DATE: 04/23/22  SUBJECTIVE:                                                                                                                                                                                      SUBJECTIVE STATEMENT: *** Patient reports that he feels a little better today. He takes Steroids when he has chemo. His Oncologist is concerned that his pain may be coming from his neck. He is contacting the  Sports Medicine Dr to request an MRI.  PERTINENT HISTORY: Travis [name] 72 y.o. male with medical history significant for IgA Kappa Multiple Myeloma who presents for a follow up visit. The patient's last visit was on 04/20/2022. In the interim since the last visit he has continued of Dara/Rev/Dex.   #Left Rib Pain # Right Shoulder Pain/Stiffness -- X-ray of the ribs show no clear issues with the left ribs. --Pain is steadily improving. --known disease in right ribs --Will make referral to physical therapy per patient request. -- Continue to monitor --Patient underwent fixation of his humerus on 12/17/2021, pathology confirmed plasmacytoma/plasma cell neoplasm.  PAIN:  Are you having pain? Yes: NPRS scale: 7/10 Pain location: R shoulder blade, spinal pain lower neck and mid back, sometimes under the R arm along lateral trunk, moves a bit. Pain description: tight muscles, bruising Aggravating factors: lifting Relieving factors: ice, heat, muscle relaxers are minimally helpful.  PRECAUTIONS: Multiple Myeloma with mets to R ribs, R humerus with ORIF for pathological fracture 12/17/21. Lesions have also been found in R rib and also in his skull.  WEIGHT BEARING RESTRICTIONS: No specific, but he reports he still  needs to limit his lifting.  FALLS:  Has patient fallen in last 6 months? No  LIVING ENVIRONMENT: Lives with: lives with their family and lives with their spouse Lives in: House/apartment Stairs: Yes: Internal: 14 steps; on left going up and External: 3 steps; on right going up bedroom on first floor, does not use them. Has following equipment at home: None  OCCUPATION: [employer], trying to retire, finishing up a few remaining projects.  PLOF: Independent  PATIENT GOALS:relieve back pain, strengthen R arm.  NEXT MD VISIT:   OBJECTIVE:   DIAGNOSTIC FINDINGS:  Right hip arthroplasty again noted. Multilevel degenerative changes affect the spine.   IMPRESSION: 1. Lytic lesions in the bilateral humeri, skull, right radius have not significantly changed. No new lesions identified. 2. New right humeral intramedullary nail crossing a pathologic fracture in the proximal humerus.  COGNITION: Overall cognitive status: Within functional limits for tasks assessed     SENSATION: WFL  POSTURE: Severely forward head, mildly kyphotic, round shoulders, neck mildly rotated to R.  UPPER EXTREMITY ROM: WNL unless otherwise indicated.  Passive ROM Right eval Left eval  Shoulder flexion 104   Shoulder extension    Shoulder abduction 91   Shoulder adduction    Shoulder internal rotation WFL   Shoulder external rotation [facility]   Elbow flexion    Elbow extension    Wrist flexion    Wrist extension    Wrist ulnar deviation    Wrist radial deviation    Wrist pronation    Wrist supination    (Blank rows = not tested)  UPPER EXTREMITY MMT: LUE WNL, R shoulder motion all limited by pain, at least 3/5, elbow at least 4-/5   SHOULDER SPECIAL TESTS: Impingement tests: Hawkins/Kennedy impingement test: negative Rotator cuff assessment: Empty can test: negative and Belly press test: negative   JOINT MOBILITY TESTING:  R shoulder tight in inferior and posterior glide. R scapula sits  high on back, mildly adducted, stiff, decreased mobility.  PALPATION:  Very tight and TTP on R cervical paraspinals, R up traps, R peri scapular muscles, B pects.   TODAY'S TREATMENT:                                                                                                                                         DATE:  05/19/22 NuStep L5 x 6 min STM to R media scapular muscles, pects, scapular mobs followed by stretch STM and stretch for ant scalenes, especially R, with stretch Seated weight shift in rotation over B hands on the mat, 3 reps each direction Standing shoulder ext and rows with 5#, 2 x 10 reps Standing shoulder ER against G tband resistance. 2 x 10 reps  05/13/22 STM thera gun around R scapula NuStep L 5 x 6 min Standing shoulder Ext 5lb 2x10 Seated rows & lats 25lb 2x10 Chest press 10lb 2x10 Shoulder abd and Flex AROM 2x10 each  Shoulder ER red 2x10 Cervical retraction 2x10 Horiz abd 2x10  05/10/21  NuStep L3 x 6 min AAROM Shoulder 2lb WaTE flex, Ext, IR x10 Seated rows green 2x12 Seated ER Red 2x10 Horiz Abd yellow 2x10 STM to upper R T trap and rhomboid  DN performed by Lead PT M. Albright   05/05/22 Education   PATIENT  EDUCATION: Education details: POC Person educated: Patient Education method: Explanation Education comprehension: verbalized understanding  HOME EXERCISE PROGRAM: Has HEP from another therapist for R shoulder rehab after ORIF for pathological humerus fracture.  ASSESSMENT:  CLINICAL IMPRESSION: *** Patient reports continued pain in his R distal/medial scapula. Performed extensive STM and stretch to all ant and post muscles in neck and R shoulder. Patient then performed strengthening for postural muscles. Tolerated all well, slightly more upright posture and less shoulder rounding after treatment.   OBJECTIVE IMPAIRMENTS: decreased activity tolerance, decreased coordination, decreased ROM, decreased strength, increased muscle  spasms, impaired flexibility, improper body mechanics, postural dysfunction, and pain.   ACTIVITY LIMITATIONS: carrying, lifting, and reach over head  PARTICIPATION LIMITATIONS: meal prep, cleaning, shopping, and community activity  PERSONAL FACTORS: Age, Past/current experiences, and 1 comorbidity: mult myeloma  are also affecting patient's functional outcome.   REHAB POTENTIAL: Good  CLINICAL DECISION MAKING: Evolving/moderate complexity  EVALUATION COMPLEXITY: Moderate   GOALS: Goals reviewed with patient? Yes  SHORT TERM GOALS: Target date: 05/20/22  I with basic HEP Baseline: Goal status: met  LONG TERM GOALS: Target date: 07/28/22  I with final HEP Baseline:  Goal status: ongoing  2.  Patient will improve painfree R shoulder ROM to at least 110 flex and abd Baseline: Painful, < 90 Goal status: ongoing  3.  Increase R shoulder strength to at least 4-/5 with pain < 4/10 Baseline:  Goal status: INITIAL  4.  Improve pain in neck and shoulder by at least 50% Baseline: Up to 10/10 Goal status: INITIAL  PLAN:  PT FREQUENCY: 2x/week  PT DURATION: 12 weeks  PLANNED INTERVENTIONS: Therapeutic exercises, Therapeutic activity, Neuromuscular re-education, Balance training, Gait training, Patient/Family education, Self Care, Joint mobilization, Cryotherapy, Moist heat, Vasopneumatic device, Ionotophoresis '4mg'$ /ml Dexamethasone, and Manual therapy  PLAN FOR NEXT SESSION: HEP, acute pain management   Mackenna Kamer April Ma L Christiano Blandon, PT, DPT 05/24/2022, 7:50 AM

## 2022-05-25 ENCOUNTER — Telehealth: Payer: Self-pay | Admitting: Cardiology

## 2022-05-25 NOTE — Telephone Encounter (Signed)
Caitlyn returning call

## 2022-05-25 NOTE — Telephone Encounter (Signed)
Left message for Travis [name] to call back.

## 2022-05-25 NOTE — Telephone Encounter (Signed)
Travis [name] from [place] returning call.

## 2022-05-25 NOTE — Telephone Encounter (Signed)
Spoke with Travis [name] and advised that patient's amiodarone was reduced to 200 mg daily and hydralazine increased to 37.5 TID.

## 2022-05-25 NOTE — Telephone Encounter (Signed)
Caitlyn with [facility] physicians calling asking what medications were added or discontinued at pt's last visit.

## 2022-05-26 ENCOUNTER — Ambulatory Visit: Payer: Medicare Other | Admitting: Physical Therapy

## 2022-05-27 ENCOUNTER — Inpatient Hospital Stay (HOSPITAL_COMMUNITY)
Admission: RE | Admit: 2022-05-27 | Discharge: 2022-05-27 | Disposition: A | Discharge status: Home / Self Care | Payer: Medicare Other | Source: Ambulatory Visit | Attending: Physician Assistant | Admitting: Physician Assistant

## 2022-05-27 ENCOUNTER — Encounter: Payer: Self-pay | Admitting: Family Medicine

## 2022-05-27 ENCOUNTER — Ambulatory Visit: Payer: Medicare Other | Admitting: Family Medicine

## 2022-05-27 VITALS — Ht 70.0 in

## 2022-05-27 DIAGNOSIS — C9000 Multiple myeloma not having achieved remission: Secondary | ICD-10-CM

## 2022-05-27 DIAGNOSIS — M542 Cervicalgia: Secondary | ICD-10-CM | POA: Insufficient documentation

## 2022-05-27 DIAGNOSIS — M8458XA Pathological fracture in neoplastic disease, other specified site, initial encounter for fracture: Secondary | ICD-10-CM | POA: Diagnosis not present

## 2022-05-27 DIAGNOSIS — M546 Pain in thoracic spine: Secondary | ICD-10-CM

## 2022-05-27 DIAGNOSIS — M2550 Pain in unspecified joint: Secondary | ICD-10-CM

## 2022-05-27 DIAGNOSIS — M5030 Other cervical disc degeneration, unspecified cervical region: Secondary | ICD-10-CM | POA: Diagnosis not present

## 2022-05-27 DIAGNOSIS — G9520 Unspecified cord compression: Secondary | ICD-10-CM | POA: Diagnosis not present

## 2022-05-27 LAB — COMPREHENSIVE METABOLIC PANEL
ALT: 21 U/L (ref 0–53)
AST: 13 U/L (ref 0–37)
Albumin: 4.1 g/dL (ref 3.5–5.2)
Alkaline Phosphatase: 97 U/L (ref 39–117)
BUN: 11 mg/dL (ref 6–23)
CO2: 30 mEq/L (ref 19–32)
Calcium: 8.7 mg/dL (ref 8.4–10.5)
Chloride: 102 mEq/L (ref 96–112)
Creatinine, Ser: 0.71 mg/dL (ref 0.40–1.50)
GFR: 92.58 mL/min (ref >60.00)
Glucose, Bld: 82 mg/dL (ref 70–99)
Potassium: 3.7 mEq/L (ref 3.5–5.1)
Sodium: 140 mEq/L (ref 135–145)
Total Bilirubin: 0.4 mg/dL (ref 0.2–1.2)
Total Protein: 6.2 g/dL (ref 6.0–8.3)

## 2022-05-27 LAB — CBC WITH DIFFERENTIAL/PLATELET
Basophils Absolute: 0.1 10*3/uL (ref 0.0–0.1)
Basophils Relative: 2.2 % (ref 0.0–3.0)
Eosinophils Absolute: 0.2 10*3/uL (ref 0.0–0.7)
Eosinophils Relative: 4.3 % (ref 0.0–5.0)
HCT: 42 % (ref 39.0–52.0)
Hemoglobin: 14.4 g/dL (ref 13.0–17.0)
Lymphocytes Relative: 8.6 % — ABNORMAL LOW (ref 12.0–46.0)
Lymphs Abs: 0.4 10*3/uL — ABNORMAL LOW (ref 0.7–4.0)
MCHC: 34.4 g/dL (ref 30.0–36.0)
MCV: 95.3 fl (ref 78.0–100.0)
Monocytes Absolute: 0.5 10*3/uL (ref 0.1–1.0)
Monocytes Relative: 11.3 % (ref 3.0–12.0)
Neutro Abs: 3.5 10*3/uL (ref 1.4–7.7)
Neutrophils Relative %: 73.6 % (ref 43.0–77.0)
Platelets: 202 10*3/uL (ref 150.0–400.0)
RBC: 4.4 Mil/uL (ref 4.22–5.81)
RDW: 16.3 % — ABNORMAL HIGH (ref 11.5–15.5)
WBC: 4.7 10*3/uL (ref 4.0–10.5)

## 2022-05-27 LAB — URIC ACID: Uric Acid, Serum: 3.5 mg/dL — ABNORMAL LOW (ref 4.0–7.8)

## 2022-05-27 LAB — SEDIMENTATION RATE: Sed Rate: 17 mm/hr (ref 0–20)

## 2022-05-27 LAB — C-REACTIVE PROTEIN: CRP: <1 mg/dL (ref 0.5–20.0)

## 2022-05-27 MED ORDER — PREDNISONE 20 MG PO TABS: 40.0000 mg | ORAL_TABLET | Freq: Every day | ORAL | 0 refills | Status: DC

## 2022-05-27 MED ORDER — KETOROLAC TROMETHAMINE 60 MG/2ML IM SOLN
60.0000 mg | Freq: Once | INTRAMUSCULAR | Status: AC
  Administered 2022-05-27: 60 mg via INTRAMUSCULAR

## 2022-05-27 MED ORDER — METHYLPREDNISOLONE ACETATE 80 MG/ML IJ SUSP
80.0000 mg | Freq: Once | INTRAMUSCULAR | Status: AC
  Administered 2022-05-27: 80 mg via INTRAMUSCULAR

## 2022-05-27 MED ORDER — GADOBUTROL 1 MMOL/ML IV SOLN
10.0000 mL | Freq: Once | INTRAVENOUS | Status: AC | PRN
  Administered 2022-05-27: 10 mL via INTRAVENOUS

## 2022-05-27 NOTE — Progress Notes (Signed)
[place] Phone: [phone] Subjective:    I'm seeing this patient by the request  of:  [name], MD  CC: Pain all over  [id number]  [place] REAS is a 72 y.o. male coming in with complaint of complaint of pain all over.  He states that the neck in the mid back is worsening than usual.  Starting to affect daily activities more, patient states that he can only lay down and any movement causes more discomfort.  Denies any shortness of breath but states that the hands arms, shoulders, legs, and axial skeleton is all severe pain that is stopping him from activity.  Taking tramadol much more regularly than usual.  Feels like this is actually been progressing over the last month and potentially worsening.  Patient is accompanied with wife who states that she Beese not confident that the treatment for the multiple myeloma is working and is wondering if this is contributing to it or not      Past Medical History:  Diagnosis Date   Arthritis    "comes and goes; mostly in hands, occasionally elbow" (13/06/4399)   Complication of anesthesia    "just wanted to sleep alot  for couple days S/P VARICOCELE OR" (04/05/2017)   DVT (deep venous thrombosis) (HCC)    LLE   Dysrhythmia    PVCs    GERD (gastroesophageal reflux disease)    History of hiatal hernia    Hypertension    Impaired glucose tolerance    Multiple myeloma (HCC)    Obesity (BMI 30-39.9) 07/26/2016   OSA on CPAP 07/26/2016   Mild with AHI 12.5/hr now on CPAP at 14cm H2O   PAF (paroxysmal atrial fibrillation) ([facility])    s/p PVI Ablation in 2018 // Flecainide Rx // Echo 8/21: EF 60-65, no RWMA, mild LVH, normal RV SF, moderate RVE, mild BAE, trivial MR, mild dilation of aortic root (40 mm)   Pneumonia    "may have had walking pneumonia a few years ago" (04/05/2017)   Pre-diabetes    Thrombophlebitis    Past Surgical History:  Procedure  Laterality Date   ATRIAL FIBRILLATION ABLATION  04/05/2017   ATRIAL FIBRILLATION ABLATION N/A 04/05/2017   Procedure: ATRIAL FIBRILLATION ABLATION;  Surgeon: Thompson Grayer, MD;  Location: [facility];  Service: Cardiovascular;  Laterality: N/A;   BONE BIOPSY Right 12/17/2021   Procedure: BONE BIOPSY;  Surgeon: Hiram Gash, MD;  Location: [facility];  Service: Orthopedics;  Laterality: Right;   COMPLETE RIGHT HIP REPLACMENT  01/19/2019   EVALUATION UNDER ANESTHESIA WITH HEMORRHOIDECTOMY N/A 02/24/2017   Procedure: EXAM UNDER ANESTHESIA WITH  HEMORRHOIDECTOMY;  Surgeon: Michael Boston, MD;  Location: [facility];  Service: General;  Laterality: N/A;   EXCISIONAL TOTAL HIP ARTHROPLASTY WITH ANTIBIOTIC SPACERS Right 09/25/2020   Procedure: EXCISIONAL TOTAL HIP ARTHROPLASTY WITH ANTIBIOTIC SPACERS;  Surgeon: Paralee Cancel, MD;  Location: [facility];  Service: Orthopedics;  Laterality: Right;   FLEXIBLE SIGMOIDOSCOPY N/A 04/15/2015   Procedure:  UNSEDATED FLEXIBLE SIGMOIDOSCOPY;  Surgeon: [name], MD;  Location: [facility];  Service: Endoscopy;  Laterality: N/A;   HUMERUS IM NAIL Right 12/17/2021   Procedure: INTRAMEDULLARY (IM) NAIL HUMERAL;  Surgeon: Hiram Gash, MD;  Location: [facility];  Service: Orthopedics;  Laterality: Right;   KNEE ARTHROSCOPY Left ~ 2016   REIMPLANTATION OF TOTAL HIP Right 12/25/2020   Procedure: REIMPLANTATION/REVISION OF RIGHT TOTAL  HIP VERSUS REPEAT IRRIGATION AND DEBRIDEMENT;  Surgeon: Paralee Cancel, MD;  Location: [facility];  Service: Orthopedics;  Laterality: Right;   TESTICLE SURGERY  1988   VARICOCELE   TONSILLECTOMY     VARICOSE VEIN SURGERY Bilateral    Social History   Socioeconomic History   Marital status: Married    Spouse name: Not on file   Number of children: 1   Years of education: law school   Highest education level: Not on file  Occupational History   Occupation: lawyer  Tobacco Use   Smoking status: Never    Smokeless tobacco: Never  Vaping Use   Vaping Use: Never used  Substance and Sexual Activity   Alcohol use: Yes    Alcohol/week: 2.0 standard drinks of alcohol    Types: 2 Glasses of wine per week    Comment: 2 glasses of wine a week, occ bourbon, beer   Drug use: No   Sexual activity: Not Currently  Other Topics Concern   Not on file  Social History Narrative   Lives in [place] with spouse in a 2 story home.  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    Social Determinants of Health   Financial Resource Strain: Not on file  Food Insecurity: Not on file  Transportation Needs: Not on file  Physical Activity: Not on file  Stress: Not on file  Social Connections: Not on file   Allergies  Allergen Reactions   Linezolid Other (See Comments)    Burning tongue    Vancomycin Rash   Family History  Problem Relation Age of Onset   Dementia Mother    Stroke Father    Atrial fibrillation Father    Hypertension Father    Hypertension Paternal Grandfather    Dementia Maternal Grandmother     Current Outpatient Medications (Endocrine & Metabolic):    predniSONE (DELTASONE) 20 MG tablet, Take 2 tablets (40 mg total) by mouth daily with breakfast.   dexamethasone (DECADRON) 4 MG tablet, Take 10 tablets (40 mg total) by mouth once a week. (Patient taking differently: Take 40 mg by mouth every 14 (fourteen) days.)  Current Outpatient Medications (Cardiovascular):    amiodarone (PACERONE) 200 MG tablet, Take 1 tablet (200 mg total) by mouth daily.   amLODipine (NORVASC) 10 MG tablet, Take 10 mg by mouth daily.    furosemide (LASIX) 40 MG tablet, Take 40 mg by mouth daily as needed for fluid or edema.   hydrALAZINE (APRESOLINE) 25 MG tablet, Take 1.5 tablets (37.5 mg total) by mouth 3 (three) times daily.   lisinopril (ZESTRIL) 40 MG tablet, TAKE ONE TABLET BY MOUTH ONE TIME DAILY  Current Outpatient Medications  (Respiratory):    fluticasone (FLONASE) 50 MCG/ACT nasal spray, Place 1 spray into both nostrils daily as needed for allergies or rhinitis.   oxymetazoline (AFRIN) 0.05 % nasal spray, Place 1 spray into both nostrils 2 (two) times daily as needed for congestion.   sodium chloride (OCEAN) 0.65 % SOLN nasal spray, Place 1 spray into both nostrils as needed for congestion.  Current Outpatient Medications (Analgesics):    acetaminophen (TYLENOL) 325 MG tablet, Take 2 tablets (650 mg total) by mouth every 6 (six) hours as needed.   traMADol (ULTRAM) 50 MG tablet, Take 1 tablet (50 mg total) by mouth every 6 (six) hours as needed.  Current Outpatient Medications (Hematological):    Cyanocobalamin (VITAMIN B-12 PO),  Take 1 tablet by mouth daily. Not sure the dosage   ELIQUIS 5 MG TABS tablet, TAKE ONE TABLET BY MOUTH TWICE A DAY   folic acid (FOLVITE) 1 MG tablet, Take 1 mg by mouth daily.  Current Outpatient Medications (Other):    acyclovir (ZOVIRAX) 400 MG tablet, Take 1 tablet (400 mg total) by mouth 2 (two) times daily.   Ascorbic Acid (VITAMIN C) 100 MG tablet, Take 100 mg by mouth daily.   cholecalciferol (VITAMIN D3) 25 MCG (1000 UNIT) tablet, Take 1,000 Units by mouth daily.   clobetasol cream (TEMOVATE) 4.96 %, Apply 1 Application topically daily as needed (rash).   gabapentin (NEURONTIN) 300 MG capsule, TAKE TWO CAPSULES BY MOUTH TWICE A DAY   lenalidomide (REVLIMID) 25 MG capsule, Take 1 capsule (25 mg total) by mouth daily. Take 21 days on, 7 days off, repeat every 28 days. [employer] auth # [id number] Date obtained: 05/12/21   lidocaine (LIDODERM) 5 %, Place 1 patch onto the skin daily as needed. Remove & Discard patch within 12 hours or as directed by MD   potassium chloride SA (KLOR-CON M) 20 MEQ tablet, TAKE ONE TABLET BY MOUTH ONE TIME DAILY, TAKE 40 MG (2 TABLETS) ON DAYS THAT YOU ALSO TAKE LASIX (FUROSEMIDE) (Patient taking differently: Take 30 mEq by mouth daily.)   prochlorperazine  (COMPAZINE) 10 MG tablet, Take 1 tablet (10 mg total) by mouth every 6 (six) hours as needed for nausea or vomiting.   psyllium (METAMUCIL) 58.6 % packet, Take 1 packet by mouth daily as needed (constipation).   triamcinolone cream (KENALOG) 0.1 %, Apply 1 application topically daily as needed (itching).   Reviewed prior external information including notes and imaging from  primary care provider As well as notes that were available from care everywhere and other healthcare systems.  Past medical history, social, surgical and family history all reviewed in electronic medical record.  No pertanent information unless stated regarding to the chief complaint.   Review of Systems:  No headache, visual changes, nausea, vomiting, diarrhea, constipation, dizziness, abdominal pain, skin rash, fevers, chills, night sweats, weight loss, swollen lymph nodes, , chest pain, shortness of breath, mood changes. POSITIVE muscle aches, body ache joint swelling  Objective  Height '5\' 10"'$  (1.778 m).   General: No apparent distress alert and oriented x3 mood and affect normal, dressed appropriately.  HEENT: Pupils equal, extraocular movements intact  Respiratory: Patient's speak in full sentences and does not appear short of breath  Cardiovascular: No lower extremity edema, non tender, no erythema   Neck exam has limited range of motion noted.  Tenderness is in all planes.  Has only 1 degree of area extension of the neck noted.  Patient is sore to palpation.  Has some midline tenderness noted in the last..  Patient does have some voluntary guarding noted.  Difficulty with standing.  Neurovascularly intact distally at the moment.   Impression and Recommendations:    The above documentation has been reviewed and is accurate and complete Lyndal Pulley, DO

## 2022-05-27 NOTE — Patient Instructions (Addendum)
MRI thoracic [phone] Pred '40mg'$  daily for 5 days Labs  If worsening pain go to the ED We will be in touch

## 2022-05-27 NOTE — Progress Notes (Signed)
error 

## 2022-05-27 NOTE — Assessment & Plan Note (Addendum)
Worsening pain of the cervical spine as well as theNeck at this time.  Discussed with patient about icing regimen and home exercises.  I am highly concerned that patient may be having worsening multiple myeloma.  Patient's most recent labs though showed a downtrending light chains.  Patient has had a history of deep venous thrombosis and I do think we should recheck sedimentation rate, uric acid, LDH to make sure nothing else is potentially contributing.  Patient's pain seems to be out of proportion and we did discuss the MRI of the thoracic spine in addition to the MRI of the cervical.  Might be quicker to also get a CT scan of the chest abdomen and pelvis for further evaluation.  Patient knows of worsening significant arthritic changes needs to seek medical attention immediately in the emergency department.  Follow-up with me again after imaging to discuss further.  For pain reduction today given Toradol and Depo-Medrol.  Prednisone also given for the next 6 to 7 days.  Patient does have a follow-up with oncology on Tuesday and is supposed to get the chemotherapy.  Patient's wife who is with him today does not know if this is improving and would like to talk to the oncologist about this.  We will send a note to them for further evaluation.  Once again discussed worsening symptoms would need to go to the emergency room.

## 2022-05-28 ENCOUNTER — Inpatient Hospital Stay (HOSPITAL_BASED_OUTPATIENT_CLINIC_OR_DEPARTMENT_OTHER)
Admission: EM | Admit: 2022-05-28 | Discharge: 2022-06-09 | DRG: 454 | Disposition: A | Discharge status: Home Health | Payer: Medicare Other | Attending: Neurosurgery | Admitting: Neurosurgery

## 2022-05-28 ENCOUNTER — Telehealth: Payer: Self-pay

## 2022-05-28 ENCOUNTER — Other Ambulatory Visit: Payer: Self-pay

## 2022-05-28 ENCOUNTER — Encounter (HOSPITAL_BASED_OUTPATIENT_CLINIC_OR_DEPARTMENT_OTHER): Payer: Self-pay | Admitting: Emergency Medicine

## 2022-05-28 ENCOUNTER — Encounter (HOSPITAL_COMMUNITY): Payer: Self-pay

## 2022-05-28 ENCOUNTER — Encounter: Payer: Self-pay | Admitting: Family Medicine

## 2022-05-28 DIAGNOSIS — T380X5A Adverse effect of glucocorticoids and synthetic analogues, initial encounter: Secondary | ICD-10-CM | POA: Diagnosis present

## 2022-05-28 DIAGNOSIS — I480 Paroxysmal atrial fibrillation: Secondary | ICD-10-CM | POA: Diagnosis present

## 2022-05-28 DIAGNOSIS — I10 Essential (primary) hypertension: Secondary | ICD-10-CM | POA: Diagnosis present

## 2022-05-28 DIAGNOSIS — Z79899 Other long term (current) drug therapy: Secondary | ICD-10-CM

## 2022-05-28 DIAGNOSIS — R7303 Prediabetes: Secondary | ICD-10-CM | POA: Diagnosis present

## 2022-05-28 DIAGNOSIS — G9520 Unspecified cord compression: Secondary | ICD-10-CM | POA: Diagnosis present

## 2022-05-28 DIAGNOSIS — Z96641 Presence of right artificial hip joint: Secondary | ICD-10-CM | POA: Diagnosis present

## 2022-05-28 DIAGNOSIS — Z6835 Body mass index (BMI) 35.0-35.9, adult: Secondary | ICD-10-CM

## 2022-05-28 DIAGNOSIS — M8458XA Pathological fracture in neoplastic disease, other specified site, initial encounter for fracture: Principal | ICD-10-CM | POA: Diagnosis present

## 2022-05-28 DIAGNOSIS — M5126 Other intervertebral disc displacement, lumbar region: Secondary | ICD-10-CM | POA: Diagnosis present

## 2022-05-28 DIAGNOSIS — Z86718 Personal history of other venous thrombosis and embolism: Secondary | ICD-10-CM

## 2022-05-28 DIAGNOSIS — C9000 Multiple myeloma not having achieved remission: Principal | ICD-10-CM | POA: Diagnosis present

## 2022-05-28 DIAGNOSIS — M4850XA Collapsed vertebra, not elsewhere classified, site unspecified, initial encounter for fracture: Secondary | ICD-10-CM

## 2022-05-28 DIAGNOSIS — R739 Hyperglycemia, unspecified: Secondary | ICD-10-CM | POA: Diagnosis present

## 2022-05-28 DIAGNOSIS — E669 Obesity, unspecified: Secondary | ICD-10-CM | POA: Diagnosis present

## 2022-05-28 DIAGNOSIS — I4891 Unspecified atrial fibrillation: Secondary | ICD-10-CM | POA: Diagnosis present

## 2022-05-28 DIAGNOSIS — K5909 Other constipation: Secondary | ICD-10-CM | POA: Insufficient documentation

## 2022-05-28 DIAGNOSIS — D6959 Other secondary thrombocytopenia: Secondary | ICD-10-CM | POA: Diagnosis present

## 2022-05-28 DIAGNOSIS — Y848 Other medical procedures as the cause of abnormal reaction of the patient, or of later complication, without mention of misadventure at the time of the procedure: Secondary | ICD-10-CM | POA: Diagnosis not present

## 2022-05-28 DIAGNOSIS — Z8249 Family history of ischemic heart disease and other diseases of the circulatory system: Secondary | ICD-10-CM

## 2022-05-28 DIAGNOSIS — Z823 Family history of stroke: Secondary | ICD-10-CM

## 2022-05-28 DIAGNOSIS — K219 Gastro-esophageal reflux disease without esophagitis: Secondary | ICD-10-CM | POA: Diagnosis present

## 2022-05-28 DIAGNOSIS — Z923 Personal history of irradiation: Secondary | ICD-10-CM

## 2022-05-28 DIAGNOSIS — E559 Vitamin D deficiency, unspecified: Secondary | ICD-10-CM | POA: Insufficient documentation

## 2022-05-28 DIAGNOSIS — G4733 Obstructive sleep apnea (adult) (pediatric): Secondary | ICD-10-CM | POA: Diagnosis present

## 2022-05-28 DIAGNOSIS — Z9221 Personal history of antineoplastic chemotherapy: Secondary | ICD-10-CM

## 2022-05-28 DIAGNOSIS — Z7901 Long term (current) use of anticoagulants: Secondary | ICD-10-CM

## 2022-05-28 DIAGNOSIS — G9741 Accidental puncture or laceration of dura during a procedure: Secondary | ICD-10-CM | POA: Diagnosis not present

## 2022-05-28 LAB — KAPPA/LAMBDA LIGHT CHAINS
Kappa free light chain: 233.8 mg/L — ABNORMAL HIGH (ref 3.3–19.4)
Kappa:Lambda Ratio: 93.52 — ABNORMAL HIGH (ref 0.26–1.65)
Lambda Free Lght Chn: 2.5 mg/L — ABNORMAL LOW (ref 5.7–26.3)

## 2022-05-28 LAB — LACTATE DEHYDROGENASE: LDH: 272 U/L — ABNORMAL HIGH (ref 120–250)

## 2022-05-28 MED ORDER — AMIODARONE HCL 200 MG PO TABS
200.0000 mg | ORAL_TABLET | Freq: Every day | ORAL | Status: DC
  Administered 2022-05-29 – 2022-06-09 (×11): 200 mg via ORAL
  Filled 2022-05-28 (×11): qty 1

## 2022-05-28 MED ORDER — HYDROCODONE-ACETAMINOPHEN 5-325 MG PO TABS
1.0000 | ORAL_TABLET | Freq: Once | ORAL | Status: AC
  Administered 2022-05-28: 1 via ORAL
  Filled 2022-05-28: qty 1

## 2022-05-28 MED ORDER — GABAPENTIN 300 MG PO CAPS
600.0000 mg | ORAL_CAPSULE | Freq: Two times a day (BID) | ORAL | Status: DC
  Administered 2022-05-29 – 2022-06-09 (×23): 600 mg via ORAL
  Filled 2022-05-28 (×23): qty 2

## 2022-05-28 MED ORDER — APIXABAN 5 MG PO TABS
5.0000 mg | ORAL_TABLET | Freq: Two times a day (BID) | ORAL | Status: DC
  Administered 2022-05-29 – 2022-05-30 (×4): 5 mg via ORAL
  Filled 2022-05-28 (×4): qty 1

## 2022-05-28 MED ORDER — DEXAMETHASONE SODIUM PHOSPHATE 4 MG/ML IJ SOLN
4.0000 mg | Freq: Four times a day (QID) | INTRAMUSCULAR | Status: DC
  Administered 2022-05-28 – 2022-06-09 (×46): 4 mg via INTRAVENOUS
  Filled 2022-05-28 (×46): qty 1

## 2022-05-28 MED ORDER — ACYCLOVIR 400 MG PO TABS
400.0000 mg | ORAL_TABLET | Freq: Two times a day (BID) | ORAL | Status: DC
  Administered 2022-05-29 – 2022-06-09 (×23): 400 mg via ORAL
  Filled 2022-05-28 (×25): qty 1

## 2022-05-28 NOTE — Addendum Note (Signed)
Addended by: Vilma Meckel R on: 05/28/2022 03:08 PM   Modules accepted: Orders

## 2022-05-28 NOTE — ED Triage Notes (Signed)
Current CA Pt current chemo regimen Having back pain (think it is into the bone) Had MRI of cervical/ upper back  yesterday but was told he needed a thoracic back MRI and advised to come here for MRI if back pain increase.  Treated with steriods injection and pred pills yesterday. \pain returned today   Ambulatory to triage

## 2022-05-28 NOTE — Progress Notes (Signed)
Plan of Care Note for accepted transfer   Patient: Travis [name] MRN: [id number]   Page: 05/28/2022  Facility requesting transfer: [facility] Requesting Provider: Dr. Maryan Rued Reason for transfer: worsening multiple myeloma with possible cord compression Facility course: 72 yo M with PAF on Eliquis, HTN, HLD, multiple myeloma who presents to [facility] at request of oncology after findings on recent MRI cervical spine of cord compression.  Has had worsening neck pain worse with standing not controlled with Tramadol. No symptoms/paresthesia in the extremities.   MRI of the cervical spine  yesterday showed progression of multiple myeloma in the thoracic spine with new pathological fracture at T2 with significant osseous retropulsion and resulting cord compression.  No abnormal cord signal identified. Had lab work yesterday with CBC,CMP, sed rate, ESR that was unremarkable. Labs not repeated in ED today.  Sees Oncology Dr. Alvy Bimler who has recommended admission with IV decadron '4mg'$  q6hr. She will also discuss with rad-onc about potential urgent palliative radiation therapy.   Also needs MRI T and L spine on arrival to [facility].   Plan of care: The patient is accepted for admission to progressive unit, at [facility] to continue care of pt while he remains in ED.  Author: Orene Desanctis, DO 05/28/2022  Check [email or web address] for on-call coverage.  Nursing staff, Please call [facility] number on Amion as soon as patient's arrival, so appropriate admitting provider can evaluate the pt.

## 2022-05-28 NOTE — Telephone Encounter (Signed)
Spoke with patient. Decided to go to [facility] and be admitted to the ED

## 2022-05-28 NOTE — ED Provider Notes (Signed)
[facility] Provider Note   CSN: [id number] Arrival date & time: 05/28/22  1549     History  Chief Complaint  Patient presents with   Back Pain    Travis [name] is a 72 y.o. male.  Patient is a 72 year old male with a history of hypertension, DVT on Eliquis, multiple myeloma, paroxysmal atrial fibrillation, prediabetes who is presenting today for ongoing neck and upper back pain.  Patient sees Dr. Alvy Bimler with oncology and is currently receiving treatment for multiple myeloma.  He has had gradually worsening pain in his lower neck and had an MRI done yesterday which showed that he had a pathologic fracture in T2 with retropulsion and some cord compression but no signaling abnormalities.  They called and reported that he needed to go to the emergency room immediately for steroids and most likely radiation.  Patient denies any numbness and tingling in his arms or legs.  He reports while lying in the bed his pain is very controlled but when he tries to stand or walk is when the pain is more significant.  He denies any recent falls or trauma  The history is provided by the patient.  Back Pain      Home Medications Prior to Admission medications   Medication Sig Start Date End Date Taking? Authorizing Provider  acetaminophen (TYLENOL) 325 MG tablet Take 2 tablets (650 mg total) by mouth every 6 (six) hours as needed. 03/15/22   Jeanell Sparrow, DO  acyclovir (ZOVIRAX) 400 MG tablet Take 1 tablet (400 mg total) by mouth 2 (two) times daily. 12/10/21   Orson Slick, MD  amiodarone (PACERONE) 200 MG tablet Take 1 tablet (200 mg total) by mouth daily. 05/12/22   Vickie Epley, MD  amLODipine (NORVASC) 10 MG tablet Take 10 mg by mouth daily.     [provider]  Ascorbic Acid (VITAMIN C) 100 MG tablet Take 100 mg by mouth daily.    [provider]  cholecalciferol (VITAMIN D3) 25 MCG (1000 UNIT) tablet Take 1,000 Units by  mouth daily.    [provider]  clobetasol cream (TEMOVATE) 8.29 % Apply 1 Application topically daily as needed (rash). 08/05/21   [provider]  Cyanocobalamin (VITAMIN B-12 PO) Take 1 tablet by mouth daily. Not sure the dosage    [provider]  dexamethasone (DECADRON) 4 MG tablet Take 10 tablets (40 mg total) by mouth once a week. Patient taking differently: Take 40 mg by mouth every 14 (fourteen) days. 12/10/21   Orson Slick, MD  ELIQUIS 5 MG TABS tablet TAKE ONE TABLET BY MOUTH TWICE A DAY 04/09/22   Fay Records, MD  fluticasone [facility]) 50 MCG/ACT nasal spray Place 1 spray into both nostrils daily as needed for allergies or rhinitis.    [provider]  folic acid (FOLVITE) 1 MG tablet Take 1 mg by mouth daily. 11/12/19   [provider]  furosemide (LASIX) 40 MG tablet Take 40 mg by mouth daily as needed for fluid or edema. 11/16/21   [provider]  gabapentin (NEURONTIN) 300 MG capsule TAKE TWO CAPSULES BY MOUTH TWICE A DAY 03/29/22   Patel, Donika K, DO  hydrALAZINE (APRESOLINE) 25 MG tablet Take 1.5 tablets (37.5 mg total) by mouth 3 (three) times daily. 05/12/22   Vickie Epley, MD  lenalidomide (REVLIMID) 25 MG capsule Take 1 capsule (25 mg total) by mouth daily. Take 21 days  on, 7 days off, repeat every 28 days. Fanny Dance # [id number] Date obtained: 05/12/21 05/12/22   Ledell Peoples IV, MD  lidocaine (LIDODERM) 5 % Place 1 patch onto the skin daily as needed. Remove & Discard patch within 12 hours or as directed by MD 03/15/22   Wynona Dove A, DO  lisinopril (ZESTRIL) 40 MG tablet TAKE ONE TABLET BY MOUTH ONE TIME DAILY 08/12/21   Fay Records, MD  oxymetazoline (AFRIN) 0.05 % nasal spray Place 1 spray into both nostrils 2 (two) times daily as needed for congestion.    [provider]  potassium chloride SA (KLOR-CON M) 20 MEQ tablet TAKE ONE TABLET BY MOUTH ONE TIME DAILY, TAKE 40 MG (2 TABLETS) ON DAYS THAT  YOU ALSO TAKE LASIX (FUROSEMIDE) Patient taking differently: Take 30 mEq by mouth daily. 10/28/21   Fay Records, MD  predniSONE (DELTASONE) 20 MG tablet Take 2 tablets (40 mg total) by mouth daily with breakfast. 05/27/22   Lyndal Pulley, DO  prochlorperazine (COMPAZINE) 10 MG tablet Take 1 tablet (10 mg total) by mouth every 6 (six) hours as needed for nausea or vomiting. 12/10/21   Orson Slick, MD  psyllium (METAMUCIL) 58.6 % packet Take 1 packet by mouth daily as needed (constipation).    [provider]  sodium chloride (OCEAN) 0.65 % SOLN nasal spray Place 1 spray into both nostrils as needed for congestion.    [provider]  traMADol (ULTRAM) 50 MG tablet Take 1 tablet (50 mg total) by mouth every 6 (six) hours as needed. 05/18/22   Dede Query T, PA-C  triamcinolone cream (KENALOG) 0.1 % Apply 1 application topically daily as needed (itching). 06/05/19   [provider]      Allergies    Linezolid and Vancomycin    Review of Systems   Review of Systems  Musculoskeletal:  Positive for back pain.    Physical Exam Updated Vital Signs BP (!) 150/71 (BP Location: Left Arm)   Pulse 68   Temp 98.5 F (36.9 C) (Oral)   Resp 18   SpO2 99%  Physical Exam Vitals and nursing note reviewed.  Constitutional:      General: He is not in acute distress.    Appearance: He is well-developed.  HENT:     Head: Normocephalic and atraumatic.  Eyes:     Conjunctiva/sclera: Conjunctivae normal.     Pupils: Pupils are equal, round, and reactive to light.  Cardiovascular:     Rate and Rhythm: Normal rate and regular rhythm.     Heart sounds: No murmur heard. Pulmonary:     Effort: Pulmonary effort is normal. No respiratory distress.     Breath sounds: Normal breath sounds. No wheezing or rales.  Abdominal:     General: There is no distension.     Palpations: Abdomen is soft.     Tenderness: There is no abdominal tenderness. There is no guarding or rebound.   Musculoskeletal:        General: Normal range of motion.     Cervical back: Normal range of motion and neck supple. Tenderness present.  Skin:    General: Skin is warm and dry.     Findings: No erythema or rash.  Neurological:     Mental Status: He is alert and oriented to person, place, and time.     Sensory: No sensory deficit.     Motor: No weakness.     Comments: 5 out  of 5 strength in bilateral upper and lower extremities.  Sensation is intact.  Psychiatric:        Behavior: Behavior normal.     ED Results / Procedures / Treatments   Labs (all labs ordered are listed, but only abnormal results are displayed) Labs Reviewed - No data to display  EKG None  Radiology MR CERVICAL SPINE W WO CONTRAST  Addendum Date: 05/28/2022   ADDENDUM REPORT: 05/28/2022 16:46 ADDENDUM: Critical Value/emergent results were called by telephone at the time of interpretation on 05/28/2022 at 4:40 pm to provider on-call Heath Lark, who verbally acknowledged these results. Electronically Signed   By: Richardean Sale M.D.   On: 05/28/2022 16:46   Result Date: 05/28/2022 CLINICAL DATA:  Multiple myeloma. Increasing neck pain/tenderness with limited range of motion. EXAM: MRI CERVICAL SPINE WITHOUT AND WITH CONTRAST TECHNIQUE: Multiplanar and multiecho pulse sequences of the cervical spine, to include the craniocervical junction and cervicothoracic junction, were obtained without and with intravenous contrast. CONTRAST:  9m GADAVIST GADOBUTROL 1 MMOL/ML IV SOLN COMPARISON:  Radiographs of the cervical and thoracic spine 05/13/2022. Chest CTA 03/15/2022. Cervical MRI 03/06/2020. FINDINGS: Alignment: The alignment of the cervical spine is anatomic, although progressive kyphosis has developed at T2 secondary to a pathologic fracture. Vertebrae: As demonstrated on previous chest CTA, there are lytic lesions in the thoracic spine with associated enhancement following contrast. A large lesion at T2 has progressed  and is associated with a new marked pathologic fracture with osseous retropulsion by 6 mm. Resulting cord compression. There is near complete replacement of the T2 vertebral body by tumor, measuring up to 4.8 x 4.0 cm transverse on axial image 47/12. There are additional smaller myelomatous lesions within the T1 vertebral body, within the right C7 and right T3 articulating facets. No other pathologic fractures are identified. Cord: The cervical cord is normal in signal and caliber. There is posterior displacement and mild compression of the thoracic cord at T2 by the pathologic fracture described above. Posterior Fossa, vertebral arteries, paraspinal tissues: Linear enhancement within the right cerebellar hemisphere is likely due to an incidental venous angioma. No associated significant linear T2 hyperintensity. The visualized posterior fossa is otherwise unremarkable.Bilateral vertebral artery flow voids. No significant paraspinal findings. Disc levels: C2-3: Bilateral facet hypertrophy contributes to chronic left foraminal narrowing. No cord deformity. C3-4: The disc appears normal. The left facet joint appears ankylosed. Chronic moderate left foraminal narrowing. C4-5: Chronic disc bulging with asymmetric uncinate spurring on the right and bilateral facet hypertrophy. Stable mild spinal stenosis without cord deformity. Moderate right-greater-than-left foraminal narrowing is similar to the previous study. C5-6: Chronic spondylosis with posterior osteophytes covering diffusely bulging disc material. Mild facet hypertrophy. Stable mild spinal stenosis and mild foraminal narrowing bilaterally. C6-7: Chronic spondylosis with posterior osteophytes covering diffusely bulging disc material. Mild bilateral facet hypertrophy. Mild foraminal narrowing bilaterally. C7-T1: Normal interspace. As above, pathologic fracture at T2 with significant osseous retropulsion and resulting cord compression. There is moderate left  greater than right foraminal narrowing at the T1-2 and T2-3 disc space levels. IMPRESSION: 1. Interval progression of multiple myeloma in the thoracic spine compared with previous chest CT of 10 weeks ago. There is a new pathologic fracture at T2 with significant osseous retropulsion and resulting cord compression. No abnormal cord signal identified. 2. Additional smaller lesions within the T1 vertebral body and right C7 and T3 articulating facets. No other pathologic fractures identified. 3. Mild multilevel cervical spondylosis with resulting mild spinal stenosis and mild to moderate  foraminal narrowing at multiple levels as described, similar to previous MRI. No cord compression in the cervical spine. Electronically Signed: By: Richardean Sale M.D. On: 05/28/2022 16:11    Procedures Procedures    Medications Ordered in ED Medications  dexamethasone (DECADRON) injection 4 mg (has no administration in time range)  HYDROcodone-acetaminophen (NORCO/VICODIN) 5-325 MG per tablet 1 tablet (has no administration in time range)    ED Course/ Medical Decision Making/ A&P                             Medical Decision Making Risk Prescription drug management.   Pt with multiple medical problems and comorbidities and presenting today with a complaint that caries a high risk for morbidity and mortality.  Here today with ongoing neck pain in the setting of having multiple myeloma.  Patient had an MRI done yesterday that showed a new pathologic fracture at T2 with retropulsion and some cord compression but normal signaling.  Patient is neurovascularly intact at this time.  He has normal strength and sensation in arms and legs.  Dr. Alvy Bimler the oncologist instructed him to come to the emergency room as he requires admission and IV steroids.  Patient had lab work done yesterday in the office and had a normal CBC, CMP, ESR, CRP.  Vital signs are reassuring at this time.  He reports while laying down the pain is  very controlled.  Will consult the hospitalist for admission.  Dr. Simeon Craft such recommended 4 mg of Decadron every 6 hours which was ordered.  Also patient will need an MRI of his thoracic and lumbar spine but MRI is not available here today and he will need that when he gets to [place] [facility].  She also reported he would most likely need radiation oncology to stabilize the fracture.           Final Clinical Impression(s) / ED Diagnoses Final diagnoses:  Multiple myeloma, remission status unspecified ([facility])  Pathologic compression fracture of spine, initial encounter [facility])    Rx / [place] Orders ED Discharge Orders     None         Blanchie Dessert, MD 05/28/22 1728

## 2022-05-28 NOTE — Telephone Encounter (Signed)
Opened in error

## 2022-05-28 NOTE — Evaluation (Signed)
I was notified by radiology there might be imminent cord compression noted on cervical MRI imaging today. Patient is currently waiting to be seen at the [facility] ED. I recommend patient to be admitted/transferred over to [facility] for further management, potentially for urgent palliative radiation therapy He expressed understanding I will see him in the hospital tomorrow

## 2022-05-29 ENCOUNTER — Other Ambulatory Visit: Payer: Self-pay

## 2022-05-29 ENCOUNTER — Inpatient Hospital Stay (HOSPITAL_COMMUNITY): Payer: Medicare Other

## 2022-05-29 ENCOUNTER — Ambulatory Visit
Admit: 2022-05-29 | Discharge: 2022-05-29 | Disposition: A | Discharge status: Home / Self Care | Payer: Medicare Other | Admitting: Radiation Oncology

## 2022-05-29 ENCOUNTER — Ambulatory Visit
Admit: 2022-05-29 | Discharge: 2022-05-29 | Disposition: A | Discharge status: Home / Self Care | Payer: Medicare Other | Attending: Radiation Oncology | Admitting: Radiation Oncology

## 2022-05-29 DIAGNOSIS — G9741 Accidental puncture or laceration of dura during a procedure: Secondary | ICD-10-CM | POA: Diagnosis not present

## 2022-05-29 DIAGNOSIS — D6959 Other secondary thrombocytopenia: Secondary | ICD-10-CM | POA: Diagnosis present

## 2022-05-29 DIAGNOSIS — Y848 Other medical procedures as the cause of abnormal reaction of the patient, or of later complication, without mention of misadventure at the time of the procedure: Secondary | ICD-10-CM | POA: Diagnosis not present

## 2022-05-29 DIAGNOSIS — M8448XA Pathological fracture, other site, initial encounter for fracture: Secondary | ICD-10-CM | POA: Diagnosis not present

## 2022-05-29 DIAGNOSIS — Z923 Personal history of irradiation: Secondary | ICD-10-CM | POA: Diagnosis not present

## 2022-05-29 DIAGNOSIS — Z6835 Body mass index (BMI) 35.0-35.9, adult: Secondary | ICD-10-CM | POA: Diagnosis not present

## 2022-05-29 DIAGNOSIS — I4891 Unspecified atrial fibrillation: Secondary | ICD-10-CM | POA: Diagnosis not present

## 2022-05-29 DIAGNOSIS — E559 Vitamin D deficiency, unspecified: Secondary | ICD-10-CM | POA: Diagnosis present

## 2022-05-29 DIAGNOSIS — M4850XA Collapsed vertebra, not elsewhere classified, site unspecified, initial encounter for fracture: Secondary | ICD-10-CM | POA: Diagnosis not present

## 2022-05-29 DIAGNOSIS — I10 Essential (primary) hypertension: Secondary | ICD-10-CM | POA: Diagnosis present

## 2022-05-29 DIAGNOSIS — Z9989 Dependence on other enabling machines and devices: Secondary | ICD-10-CM | POA: Diagnosis not present

## 2022-05-29 DIAGNOSIS — E669 Obesity, unspecified: Secondary | ICD-10-CM | POA: Diagnosis present

## 2022-05-29 DIAGNOSIS — Z96641 Presence of right artificial hip joint: Secondary | ICD-10-CM | POA: Diagnosis present

## 2022-05-29 DIAGNOSIS — Z7901 Long term (current) use of anticoagulants: Secondary | ICD-10-CM | POA: Diagnosis not present

## 2022-05-29 DIAGNOSIS — R7303 Prediabetes: Secondary | ICD-10-CM | POA: Diagnosis present

## 2022-05-29 DIAGNOSIS — C9000 Multiple myeloma not having achieved remission: Secondary | ICD-10-CM

## 2022-05-29 DIAGNOSIS — Z823 Family history of stroke: Secondary | ICD-10-CM | POA: Diagnosis not present

## 2022-05-29 DIAGNOSIS — Z86718 Personal history of other venous thrombosis and embolism: Secondary | ICD-10-CM

## 2022-05-29 DIAGNOSIS — Z79899 Other long term (current) drug therapy: Secondary | ICD-10-CM | POA: Diagnosis not present

## 2022-05-29 DIAGNOSIS — G4733 Obstructive sleep apnea (adult) (pediatric): Secondary | ICD-10-CM | POA: Diagnosis present

## 2022-05-29 DIAGNOSIS — K5909 Other constipation: Secondary | ICD-10-CM | POA: Insufficient documentation

## 2022-05-29 DIAGNOSIS — M8458XA Pathological fracture in neoplastic disease, other specified site, initial encounter for fracture: Secondary | ICD-10-CM | POA: Diagnosis present

## 2022-05-29 DIAGNOSIS — M5126 Other intervertebral disc displacement, lumbar region: Secondary | ICD-10-CM | POA: Diagnosis present

## 2022-05-29 DIAGNOSIS — I480 Paroxysmal atrial fibrillation: Secondary | ICD-10-CM | POA: Diagnosis present

## 2022-05-29 DIAGNOSIS — K219 Gastro-esophageal reflux disease without esophagitis: Secondary | ICD-10-CM | POA: Diagnosis present

## 2022-05-29 DIAGNOSIS — G9520 Unspecified cord compression: Secondary | ICD-10-CM | POA: Diagnosis present

## 2022-05-29 DIAGNOSIS — Z9221 Personal history of antineoplastic chemotherapy: Secondary | ICD-10-CM | POA: Diagnosis not present

## 2022-05-29 DIAGNOSIS — Z8249 Family history of ischemic heart disease and other diseases of the circulatory system: Secondary | ICD-10-CM | POA: Diagnosis not present

## 2022-05-29 DIAGNOSIS — T380X5A Adverse effect of glucocorticoids and synthetic analogues, initial encounter: Secondary | ICD-10-CM | POA: Diagnosis present

## 2022-05-29 LAB — RAD ONC ARIA SESSION SUMMARY
Course Elapsed Days: 0
Plan Fractions Treated to Date: 1
Plan Total Fractions Prescribed: 1
Session Number: 1

## 2022-05-29 LAB — CBC WITH DIFFERENTIAL/PLATELET
Abs Immature Granulocytes: 0.03 10*3/uL (ref 0.00–0.07)
Basophils Absolute: 0 10*3/uL (ref 0.0–0.1)
Basophils Relative: 0 %
Eosinophils Absolute: 0 10*3/uL (ref 0.0–0.5)
Eosinophils Relative: 0 %
HCT: 40.8 % (ref 39.0–52.0)
Hemoglobin: 13.9 g/dL (ref 13.0–17.0)
Immature Granulocytes: 1 %
Lymphocytes Relative: 2 %
Lymphs Abs: 0.1 10*3/uL — ABNORMAL LOW (ref 0.7–4.0)
MCH: 32.9 pg (ref 26.0–34.0)
MCHC: 34.1 g/dL (ref 30.0–36.0)
MCV: 96.7 fL (ref 80.0–100.0)
Monocytes Absolute: 0.2 10*3/uL (ref 0.1–1.0)
Monocytes Relative: 3 %
Neutro Abs: 6.1 10*3/uL (ref 1.7–7.7)
Neutrophils Relative %: 94 %
Platelets: 181 10*3/uL (ref 150–400)
RBC: 4.22 MIL/uL (ref 4.22–5.81)
RDW: 14.8 % (ref 11.5–15.5)
WBC: 6.5 10*3/uL (ref 4.0–10.5)
nRBC: 0 % (ref 0.0–0.2)

## 2022-05-29 LAB — MAGNESIUM: Magnesium: 2.3 mg/dL (ref 1.7–2.4)

## 2022-05-29 LAB — BASIC METABOLIC PANEL
Anion gap: 10 (ref 5–15)
BUN: 13 mg/dL (ref 8–23)
CO2: 23 mmol/L (ref 22–32)
Calcium: 8.2 mg/dL — ABNORMAL LOW (ref 8.9–10.3)
Chloride: 103 mmol/L (ref 98–111)
Creatinine, Ser: 0.8 mg/dL (ref 0.61–1.24)
GFR, Estimated: >60 mL/min (ref >60)
Glucose, Bld: 152 mg/dL — ABNORMAL HIGH (ref 70–99)
Potassium: 3.9 mmol/L (ref 3.5–5.1)
Sodium: 136 mmol/L (ref 135–145)

## 2022-05-29 LAB — SEDIMENTATION RATE: Sed Rate: 11 mm/hr (ref 0–16)

## 2022-05-29 MED ORDER — ACETAMINOPHEN 325 MG PO TABS
650.0000 mg | ORAL_TABLET | Freq: Four times a day (QID) | ORAL | Status: DC | PRN
  Administered 2022-06-09: 650 mg via ORAL
  Filled 2022-05-29: qty 2

## 2022-05-29 MED ORDER — HYDRALAZINE HCL 25 MG PO TABS
37.5000 mg | ORAL_TABLET | Freq: Three times a day (TID) | ORAL | Status: DC
  Administered 2022-05-29 – 2022-06-09 (×22): 37.5 mg via ORAL
  Filled 2022-05-29 (×27): qty 2

## 2022-05-29 MED ORDER — LISINOPRIL 20 MG PO TABS
40.0000 mg | ORAL_TABLET | Freq: Every day | ORAL | Status: DC
  Administered 2022-05-29 – 2022-06-09 (×10): 40 mg via ORAL
  Filled 2022-05-29 (×10): qty 2

## 2022-05-29 MED ORDER — HYDROCODONE-ACETAMINOPHEN 5-325 MG PO TABS
1.0000 | ORAL_TABLET | Freq: Four times a day (QID) | ORAL | Status: DC | PRN
  Administered 2022-05-29 – 2022-06-05 (×5): 1 via ORAL
  Administered 2022-06-05 – 2022-06-06 (×2): 2 via ORAL
  Administered 2022-06-07: 1 via ORAL
  Filled 2022-05-29: qty 1
  Filled 2022-05-29: qty 2
  Filled 2022-05-29: qty 1
  Filled 2022-05-29: qty 2
  Filled 2022-05-29 (×5): qty 1

## 2022-05-29 MED ORDER — GADOBUTROL 1 MMOL/ML IV SOLN
10.0000 mL | Freq: Once | INTRAVENOUS | Status: AC | PRN
  Administered 2022-05-29: 10 mL via INTRAVENOUS

## 2022-05-29 MED ORDER — FOLIC ACID 1 MG PO TABS
1.0000 mg | ORAL_TABLET | Freq: Every day | ORAL | Status: DC
  Administered 2022-05-29 – 2022-06-09 (×10): 1 mg via ORAL
  Filled 2022-05-29 (×11): qty 1

## 2022-05-29 MED ORDER — LENALIDOMIDE 25 MG PO CAPS: 25.0000 mg | ORAL_CAPSULE | Freq: Every day | ORAL | Status: DC

## 2022-05-29 MED ORDER — SENNOSIDES-DOCUSATE SODIUM 8.6-50 MG PO TABS: 1.0000 | ORAL_TABLET | Freq: Every evening | ORAL | Status: DC | PRN

## 2022-05-29 MED ORDER — HEPARIN SODIUM (PORCINE) 5000 UNIT/ML IJ SOLN: 5000.0000 [IU] | Freq: Three times a day (TID) | INTRAMUSCULAR | Status: DC

## 2022-05-29 MED ORDER — ONDANSETRON HCL 4 MG PO TABS: 4.0000 mg | ORAL_TABLET | Freq: Four times a day (QID) | ORAL | Status: DC | PRN

## 2022-05-29 MED ORDER — AMLODIPINE BESYLATE 10 MG PO TABS
10.0000 mg | ORAL_TABLET | Freq: Every day | ORAL | Status: DC
  Administered 2022-05-29 – 2022-06-09 (×10): 10 mg via ORAL
  Filled 2022-05-29 (×10): qty 1

## 2022-05-29 MED ORDER — ONDANSETRON HCL 4 MG/2ML IJ SOLN
4.0000 mg | Freq: Four times a day (QID) | INTRAMUSCULAR | Status: DC | PRN
  Administered 2022-06-04: 4 mg via INTRAVENOUS

## 2022-05-29 MED ORDER — MORPHINE SULFATE (PF) 2 MG/ML IV SOLN: 2.0000 mg | INTRAVENOUS | Status: DC | PRN

## 2022-05-29 MED ORDER — POLYETHYLENE GLYCOL 3350 17 G PO PACK
17.0000 g | PACK | Freq: Every day | ORAL | Status: DC
  Administered 2022-05-29 – 2022-06-09 (×7): 17 g via ORAL
  Filled 2022-05-29 (×9): qty 1

## 2022-05-29 MED ORDER — SENNOSIDES-DOCUSATE SODIUM 8.6-50 MG PO TABS
2.0000 | ORAL_TABLET | Freq: Two times a day (BID) | ORAL | Status: DC
  Administered 2022-05-29 – 2022-06-09 (×19): 2 via ORAL
  Filled 2022-05-29 (×21): qty 2

## 2022-05-29 MED ORDER — MORPHINE SULFATE (PF) 2 MG/ML IV SOLN
2.0000 mg | INTRAVENOUS | Status: DC | PRN
  Administered 2022-06-04 – 2022-06-05 (×2): 2 mg via INTRAVENOUS
  Filled 2022-05-29 (×2): qty 1

## 2022-05-29 MED ORDER — ACETAMINOPHEN 650 MG RE SUPP: 650.0000 mg | Freq: Four times a day (QID) | RECTAL | Status: DC | PRN

## 2022-05-29 NOTE — Progress Notes (Addendum)
[facility]                                                                              Travis [name], is a 72 y.o. male, DOB - July 12, 1950, RDE:[id number] Admit date - 05/28/2022    Outpatient Primary MD for the patient is Koleen Nimrod Walker Shadow, MD  LOS - 0  days  Chief Complaint  Patient presents with   Back Pain       Brief summary   Patient is a 72 year old male with OSA on CPAP, HTN, paroxysmal A-fib on chronic anticoagulation, history of DVT and multiple myeloma.  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  Since last month, he had aching pain in his upper back that has progressively gotten worse in the last few weeks.  PT did not help much.   He was evaluated by sports medicine and had an MRI of the C-spine which showed interval progression of multiple myeloma in the thoracic spine compared with previous CT chest 10 weeks prior and new pathology go fracture at T2 with significant osseous retropulsion and resulting cord compression.  Additional smaller lesions within the T1 vertebral body and right C7 and T3 articulating facets. He was called by his oncologist, Dr. Alvy Bimler to be hospitalized for emergent radiation therapy.  Currently on chemotherapy every 3 weeks.    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  No cord edema or myelomalacia identified.  Small tumor foci at most of the thoracic levels with no thoracic extraosseous or epidural tumor extension.  Posterior rib involvement intermittently noted.  Small layering pleural effusions -MRI L-spine showed  small multiple myeloma lesions scattered at all lumbar levels and in visible left sacral alla, 2.3 cm and left L1 posterior elements and pedicle.  Underlying chronic lumbar spine degeneration with a new central disc herniation at L3-L4 since 2021 now resulting in mild to moderate degenerative spinal stenosis. -Oncology consulted, seen by Dr. Alvy Bimler this a.m. recommended continue high-dose scheduled dexamethasone and will consult radiation oncology for urgent palliative XRT. -Continue pain control, bowel regimen - hold off PT  Addendum: Neurosurgery consulted, d/w Dr Cyndy Freeze, will evaluate patient for further recs  Active Problems:  Multiple myeloma ([place]) -Appreciate oncology recommendations, recommended to hold Revlimid while hospitalized -Further treatment plan per Dr. Lorenso Courier    Hypertension -BP currently stable, continue Zestril, Norvasc    Atrial fibrillation ([facility]) -Heart rate controlled, continue amiodarone -Continue eliquis    OSA (obstructive sleep apnea) - CPAP QHS    History of DVT (deep vein thrombosis) -Currently on Eliquis    Other constipation -Placed on scheduled laxatives, MiraLAX, Senokot-S  Obesity Estimated body mass index is 35.08 kg/m as calculated from the following:   Height as of this encounter: '5\' 10"'$  (1.778 m).  Weight as of this encounter: 110.9 kg.  Code Status: Full code DVT Prophylaxis:  SCDs Start: 05/29/22 0348 apixaban (ELIQUIS) tablet 5 mg   Level of Care: Level of care: Progressive Family Communication: Updated patient Disposition Plan:      Remains inpatient appropriate: High risk of neurological compromise from T2 vertebral fracture and cord compression Procedures:  MRI T-spine, L-spine  Consultants:   Oncology  Antimicrobials:   Anti-infectives (From admission, onward)    Start     Dose/Rate Route Frequency Ordered Stop   05/28/22 2345  acyclovir (ZOVIRAX) tablet 400 mg        400 mg Oral 2 times daily 05/28/22 2333             Medications  acyclovir  400 mg Oral BID   amiodarone  200 mg Oral Daily   amLODipine  10 mg Oral Daily   apixaban  5 mg Oral BID   dexamethasone (DECADRON) injection  4 mg Intravenous Q0G   folic acid  1 mg Oral Daily   gabapentin  600 mg Oral BID   hydrALAZINE  37.5 mg Oral TID   lisinopril  40 mg Oral Daily   polyethylene glycol  17 g Oral Daily   senna-docusate  2 tablet Oral BID      Subjective:   Travis [name] was seen and examined today.  Per patient, does not have much back pain when he is lying and not moving.  Reasonably controlled right now. + Constipation.  Patient denies dizziness, chest pain, shortness of breath, abdominal pain, N/V.  Currently no focal weakness. Objective:   Vitals:   05/28/22 2248 05/28/22 2335 05/29/22 0235 05/29/22 0835  BP: 138/72 137/74 (!) 150/73 (!) 145/75  Pulse: 69 70 73 66  Resp: '15 18 16 19  '$ Temp: 97.9 F (36.6 C) 98 F (36.7 C) 98.4 F (36.9 C) 98.2 F (36.8 C)  TempSrc: Oral Oral Oral Oral  SpO2: 99% 97% 98% 98%  Weight:   110.9 kg   Height:   '5\' 10"'$  (1.778 m)    No intake or output data in the 24 hours ending 05/29/22 1241   Wt Readings from Last 3 Encounters:  05/29/22 110.9 kg  05/18/22 111.8 kg  05/13/22 112.9 kg     Exam General: Alert and oriented x 3, NAD Cardiovascular: S1 S2 auscultated,  RRR Respiratory: Clear to auscultation B/L Gastrointestinal: Soft, nontender, nondistended, + bowel sounds Ext: no pedal edema bilaterally Neuro: no new Psych: Normal affect, pleasant    Data Reviewed:  I have personally reviewed following labs    CBC Lab Results  Component Value Date   WBC 6.5 05/29/2022   RBC 4.22 05/29/2022   HGB 13.9 05/29/2022   HCT 40.8 05/29/2022   MCV 96.7 05/29/2022   MCH 32.9 05/29/2022   PLT 181 05/29/2022   MCHC 34.1 05/29/2022   RDW 14.8 05/29/2022   LYMPHSABS 0.1 (L) 05/29/2022   MONOABS 0.2 05/29/2022   EOSABS 0.0 05/29/2022   BASOSABS 0.0 05/29/2022      Last metabolic panel Lab Results  Component Value Date   NA 136 05/29/2022   K 3.9 05/29/2022   CL 103 05/29/2022   CO2 23 05/29/2022   BUN 13 05/29/2022   CREATININE 0.80 05/29/2022   GLUCOSE 152 (H) 05/29/2022   GFRNONAA >60 05/29/2022   GFRAA 108 12/04/2019   CALCIUM 8.2 (L) 05/29/2022   PROT 6.2 05/27/2022   ALBUMIN 4.1 05/27/2022   LABGLOB 2.0 (L) 05/11/2022  AGRATIO 2.4 (H) 04/23/2022   BILITOT 0.4 05/27/2022   ALKPHOS 97 05/27/2022   AST 13 05/27/2022   ALT 21 05/27/2022   ANIONGAP 10 05/29/2022    CBG (last 3)  No results for input(s): "GLUCAP" in the last 72 hours.    Coagulation Profile: No results for input(s): "INR", "PROTIME" in the last 168 hours.   Radiology Studies: I have personally reviewed the imaging studies  MR LUMBAR SPINE W WO CONTRAST  Result Date: 05/29/2022 CLINICAL DATA:  72 year old male with multiple myeloma. 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 EXAM: MRI LUMBAR SPINE WITHOUT AND WITH CONTRAST TECHNIQUE: Multiplanar and multiecho pulse sequences of the lumbar spine were obtained without and with intravenous contrast. CONTRAST:  2m GADAVIST GADOBUTROL 1 MMOL/ML IV SOLN thoracic in conjunction with contrast enhanced imaging of the chest, abdomen, and pelvis reported separately. COMPARISON:  Thoracic MRI today reported separately. Prior lumbar MRI 06/25/2019. FINDINGS: Segmentation: Normal, concordant with the thoracic numbering today and the same designation used in 2021. Alignment:  Stable lumbar vertebral height and alignment since 2021. Vertebrae: Confluent up to 2.3 cm tumor in the left L1 posterior elements including the pedicle (series 11, image 7). Small 8 mm tumor in the inferior L2 body. And larger 2 cm tumor in the left L2 pedicle. Subcentimeter tumor inferior L3 vertebral body. Larger 10 mm tumor posterior L4 vertebral body. L5 small 9 mm and smaller  tumor in the right L5 body. Scattered subcentimeter left sacral ala tumor. These lesions are new since 2021. No lumbar or visible sacral pathologic fracture. No extraosseous or epidural tumor identified. Conus medullaris and cauda equina: Conus extends to the L1 level. No lower spinal cord or conus signal abnormality. No abnormal intradural enhancement. No dural thickening identified. Paraspinal and other soft tissues: Negative for age visible abdominal viscera. Negative lumbar paraspinal soft tissues. Disc levels: Lumbar spine degeneration not significantly changed since 2021 Aside from new moderate size central disc protrusion at L3-L4 (series 11, image 22) now resulting in mild to moderate spinal stenosis at that level. IMPRESSION: 1. Generally small Multiple Myeloma lesions scattered at all lumbar levels and in the visible left sacral ala. Largest lumbar tumor is 2.3 cm in the left L1 posterior elements and pedicle. No lumbar extraosseous or epidural tumor extension, or pathologic fracture. 2. 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 Electronically Signed   By: HGenevie AnnM.D.   On: 05/29/2022 12:22   MR THORACIC SPINE W WO CONTRAST  Result Date: 05/29/2022 CLINICAL DATA:  72year old male with multiple myeloma. 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 EXAM: MRI THORACIC WITHOUT AND WITH CONTRAST TECHNIQUE: Multiplanar and multiecho pulse sequences of the thoracic spine were obtained without and with intravenous contrast. CONTRAST:  123mGADAVIST GADOBUTROL 1 MMOL/ML IV SOLN COMPARISON:  Cervical spine MRI without and with contrast 05/27/2022. FINDINGS: Limited cervical spine imaging: Stable compared to the recent 05/27/2022 exam. Thoracic spine segmentation:  Appears to be normal. Alignment: Exaggerated  upper thoracic kyphosis and part related to the T2 pathologic lesion. Vertebrae: T1 better demonstrated on the recent cervical comparison. Highly abnormal T2 level with retropulsed and epidural tumor. Subsequent cord compression which does not appear significantly changed since 05/27/2022 (series 22, image 9, series 23, images 7 and 8).  No definite cord edema or myelomalacia. Bulky ventral and left lateral extraosseous tumor extension as previously noted. Tumor in the T2 lamina and spinous process also. See series 26, image 9). Mild right T3 posteroinferior vertebral body tumor tracking toward the pedicle. No pathologic fracture. No tumor identified at T4, T5. However, there is posterior right 5th rib tumor on series 26, image 26. Similar small 5 mm enhancing tumor in the left T6 inferior body, endplate. No tumor identified at T7. However, there is posterior right 7th rib tumor (series 27, image 5). T8 12 mm tumor in the anterior body on the right side. No pathologic fracture. No T9 tumor. T10 inferior endplate 12 mm tumor with subtle associated inferior endplate compression. T11 bulky 18 mm midline lamina and spinous process tumor, but no extraosseous extension. No T12 tumor. Lumbar spine detailed separately. Cord: Normal spinal canal patency and thoracic cord below the T2-T3 disc level. Cord signal and morphology remains normal to the conus at L1. No abnormal intradural enhancement. No definite dural thickening. Paraspinal and other soft tissues: T2 paraspinal tumor eccentric to the left as above. Small layering pleural effusions. Negative visible other chest and upper abdominal viscera. Disc levels: Mild for age underlying thoracic spine degeneration. No degenerative spinal stenosis. IMPRESSION: 1. 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 No change compared to 05/27/2022. No cord edema or  myelomalacia identified. 2. Small tumor foci at most other thoracic levels, with no other thoracic extraosseous or epidural tumor extension. Posterior rib involvement intermittently noted. 3. Small layering pleural effusions. 4. Lumbar MRI today is reported separately. Electronically Signed   By: Genevie Ann M.D.   On: 05/29/2022 12:11   MR CERVICAL SPINE W WO CONTRAST  Addendum Date: 05/28/2022   ADDENDUM REPORT: 05/28/2022 16:46 ADDENDUM: Critical Value/emergent results were called by telephone at the time of interpretation on 05/28/2022 at 4:40 pm to provider on-call Heath Lark, who verbally acknowledged these results. Electronically Signed   By: Richardean Sale M.D.   On: 05/28/2022 16:46   Result Date: 05/28/2022 CLINICAL DATA:  Multiple myeloma. Increasing neck pain/tenderness with limited range of motion. EXAM: MRI CERVICAL SPINE WITHOUT AND WITH CONTRAST TECHNIQUE: Multiplanar and multiecho pulse sequences of the cervical spine, to include the craniocervical junction and cervicothoracic junction, were obtained without and with intravenous contrast. CONTRAST:  22m GADAVIST GADOBUTROL 1 MMOL/ML IV SOLN COMPARISON:  Radiographs of the cervical and thoracic spine 05/13/2022. Chest CTA 03/15/2022. Cervical MRI 03/06/2020. FINDINGS: Alignment: The alignment of the cervical spine is anatomic, although progressive kyphosis has developed at T2 secondary to a pathologic fracture. Vertebrae: As demonstrated on previous chest CTA, there are lytic lesions in the thoracic spine with associated enhancement following contrast. A large lesion at T2 has progressed and is associated with a new marked pathologic fracture with osseous retropulsion by 6 mm. Resulting cord compression. There is near complete replacement of the T2 vertebral body by tumor, measuring up to 4.8 x 4.0 cm transverse on axial image 47/12. There are additional smaller myelomatous lesions within the T1 vertebral body, within the right C7 and right T3  articulating facets. No other pathologic fractures are identified. Cord: The cervical cord is normal in signal and caliber. There is posterior displacement and mild compression of the thoracic cord at T2 by the pathologic fracture described above. Posterior Fossa, vertebral arteries, paraspinal tissues: Linear enhancement within the right cerebellar hemisphere is likely due to an  incidental venous angioma. No associated significant linear T2 hyperintensity. The visualized posterior fossa is otherwise unremarkable.Bilateral vertebral artery flow voids. No significant paraspinal findings. Disc levels: C2-3: Bilateral facet hypertrophy contributes to chronic left foraminal narrowing. No cord deformity. C3-4: The disc appears normal. The left facet joint appears ankylosed. Chronic moderate left foraminal narrowing. C4-5: Chronic disc bulging with asymmetric uncinate spurring on the right and bilateral facet hypertrophy. Stable mild spinal stenosis without cord deformity. Moderate right-greater-than-left foraminal narrowing is similar to the previous study. C5-6: Chronic spondylosis with posterior osteophytes covering diffusely bulging disc material. Mild facet hypertrophy. Stable mild spinal stenosis and mild foraminal narrowing bilaterally. C6-7: Chronic spondylosis with posterior osteophytes covering diffusely bulging disc material. Mild bilateral facet hypertrophy. Mild foraminal narrowing bilaterally. C7-T1: Normal interspace. As above, pathologic fracture at T2 with significant osseous retropulsion and resulting cord compression. There is moderate left greater than right foraminal narrowing at the T1-2 and T2-3 disc space levels. IMPRESSION: 1. Interval progression of multiple myeloma in the thoracic spine compared with previous chest CT of 10 weeks ago. There is a new pathologic fracture at T2 with significant osseous retropulsion and resulting cord compression. No abnormal cord signal identified. 2. Additional  smaller lesions within the T1 vertebral body and right C7 and T3 articulating facets. No other pathologic fractures identified. 3. Mild multilevel cervical spondylosis with resulting mild spinal stenosis and mild to moderate foraminal narrowing at multiple levels as described, similar to previous MRI. No cord compression in the cervical spine. Electronically Signed: By: Richardean Sale M.D. On: 05/28/2022 16:11       Kristianne Albin M.D. [facility] 05/29/2022, 12:41 PM  Available via Epic secure chat 7am-7pm After 7 pm, please refer to night coverage provider listed on amion.

## 2022-05-29 NOTE — Progress Notes (Signed)
  Radiation Oncology         [phone]) [phone] ________________________________  Name: Travis [name] MRN: [id number]  Date: 05/29/2022  DOB: 1951-04-02  Weekly Radiation Therapy Management- Inpatient  Multiple Myeloma with spinal cord compression at T2   Current Dose: 3.0 Gy     Planned Dose:  3.0 Gy  Narrative . . . . . . . . The patient tolerated his upper thoracic spine radiation treatment well.                                   Plan . . . . . . . . . . . [name] He has completed his planned course of urgent radiation treatment.  He will proceed with surgery under the direction of Dr. Ashok Pall in the near future.  He will remain an inpatient until surgery.  The patient will likely require postop radiation treatments at a later date.  ________________________________   Blair Promise, PhD, MD

## 2022-05-29 NOTE — Consult Note (Signed)
Reason for Consult:multiple myeloma, T2 pathological fracture  Referring Physician: Jayten, Travis [name] is an 72 y.o. male with a progressive increase in pain in the posterior neck and shoulder. Mri has confirmed T2 tumor and a pathological fracture. Admitted for pain yesterday.   Past Medical History:  Diagnosis Date   Arthritis    "comes and goes; mostly in hands, occasionally elbow" (16/05/958)   Complication of anesthesia    "just wanted to sleep alot  for couple days S/P VARICOCELE OR" (04/05/2017)   DVT (deep venous thrombosis) (HCC)    LLE   Dysrhythmia    PVCs    GERD (gastroesophageal reflux disease)    History of hiatal hernia    Hypertension    Impaired glucose tolerance    Multiple myeloma (HCC)    Obesity (BMI 30-39.9) 07/26/2016   OSA on CPAP 07/26/2016   Mild with AHI 12.5/hr now on CPAP at 14cm H2O   PAF (paroxysmal atrial fibrillation) ([place])    s/p PVI Ablation in 2018 // Flecainide Rx // Echo 8/21: EF 60-65, no RWMA, mild LVH, normal RV SF, moderate RVE, mild BAE, trivial MR, mild dilation of aortic root (40 mm)   Pneumonia    "may have had walking pneumonia a few years ago" (04/05/2017)   Pre-diabetes    Thrombophlebitis     Past Surgical History:  Procedure Laterality Date   ATRIAL FIBRILLATION ABLATION  04/05/2017   ATRIAL FIBRILLATION ABLATION N/A 04/05/2017   Procedure: ATRIAL FIBRILLATION ABLATION;  Surgeon: Thompson Grayer, MD;  Location: [facility];  Service: Cardiovascular;  Laterality: N/A;   BONE BIOPSY Right 12/17/2021   Procedure: BONE BIOPSY;  Surgeon: Hiram Gash, MD;  Location: [facility];  Service: Orthopedics;  Laterality: Right;   COMPLETE RIGHT HIP REPLACMENT  01/19/2019   EVALUATION UNDER ANESTHESIA WITH HEMORRHOIDECTOMY N/A 02/24/2017   Procedure: EXAM UNDER ANESTHESIA WITH  HEMORRHOIDECTOMY;  Surgeon: Michael Boston, MD;  Location: [facility];  Service: General;  Laterality: N/A;   EXCISIONAL TOTAL HIP  ARTHROPLASTY WITH ANTIBIOTIC SPACERS Right 09/25/2020   Procedure: EXCISIONAL TOTAL HIP ARTHROPLASTY WITH ANTIBIOTIC SPACERS;  Surgeon: Paralee Cancel, MD;  Location: [facility];  Service: Orthopedics;  Laterality: Right;   FLEXIBLE SIGMOIDOSCOPY N/A 04/15/2015   Procedure:  UNSEDATED FLEXIBLE SIGMOIDOSCOPY;  Surgeon: [name], MD;  Location: [facility];  Service: Endoscopy;  Laterality: N/A;   HUMERUS IM NAIL Right 12/17/2021   Procedure: INTRAMEDULLARY (IM) NAIL HUMERAL;  Surgeon: Hiram Gash, MD;  Location: [facility];  Service: Orthopedics;  Laterality: Right;   KNEE ARTHROSCOPY Left ~ 2016   REIMPLANTATION OF TOTAL HIP Right 12/25/2020   Procedure: REIMPLANTATION/REVISION OF RIGHT TOTAL HIP VERSUS REPEAT IRRIGATION AND DEBRIDEMENT;  Surgeon: Paralee Cancel, MD;  Location: [facility];  Service: Orthopedics;  Laterality: Right;   TESTICLE SURGERY  1988   VARICOCELE   TONSILLECTOMY     VARICOSE VEIN SURGERY Bilateral     Family History  Problem Relation Age of Onset   Dementia Mother    Stroke Father    Atrial fibrillation Father    Hypertension Father    Hypertension Paternal Grandfather    Dementia Maternal Grandmother     Social History:  reports that he has never smoked. He has never used smokeless tobacco. He reports current alcohol use of about 2.0 standard drinks of alcohol per week. He reports that he does not use drugs.  Allergies:  Allergies  Allergen Reactions  Linezolid Other (See Comments)    Burning tongue    Vancomycin Rash    Medications: I have reviewed the patient's current medications.  Results for orders placed or performed during the hospital encounter of 05/28/22 (from the past 48 hour(s))  CBC with Differential/Platelet     Status: Abnormal   Collection Time: 05/29/22  4:44 AM  Result Value Ref Range   WBC 6.5 4.0 - 10.5 K/uL   RBC 4.22 4.22 - 5.81 MIL/uL   Hemoglobin 13.9 13.0 - 17.0 g/dL   HCT 40.8 39.0 - 52.0 %   MCV 96.7 80.0 -  100.0 fL   MCH 32.9 26.0 - 34.0 pg   MCHC 34.1 30.0 - 36.0 g/dL   RDW 14.8 11.5 - 15.5 %   Platelets 181 150 - 400 K/uL   nRBC 0.0 0.0 - 0.2 %   Neutrophils Relative % 94 %   Neutro Abs 6.1 1.7 - 7.7 K/uL   Lymphocytes Relative 2 %   Lymphs Abs 0.1 (L) 0.7 - 4.0 K/uL   Monocytes Relative 3 %   Monocytes Absolute 0.2 0.1 - 1.0 K/uL   Eosinophils Relative 0 %   Eosinophils Absolute 0.0 0.0 - 0.5 K/uL   Basophils Relative 0 %   Basophils Absolute 0.0 0.0 - 0.1 K/uL   Immature Granulocytes 1 %   Abs Immature Granulocytes 0.03 0.00 - 0.07 K/uL    Comment: Performed at [facility], [place] [address], [place], [place] [address]  Basic metabolic panel     Status: Abnormal   Collection Time: 05/29/22  4:44 AM  Result Value Ref Range   Sodium 136 135 - 145 mmol/L   Potassium 3.9 3.5 - 5.1 mmol/L   Chloride 103 98 - 111 mmol/L   CO2 23 22 - 32 mmol/L   Glucose, Bld 152 (H) 70 - 99 mg/dL    Comment: Glucose reference range applies only to samples taken after fasting for at least 8 hours.   BUN 13 8 - 23 mg/dL   Creatinine, Ser 0.80 0.61 - 1.24 mg/dL   Calcium 8.2 (L) 8.9 - 10.3 mg/dL   GFR, Estimated >60 >60 mL/min    Comment: (NOTE) Calculated using the CKD-EPI Creatinine Equation (2021)    Anion gap 10 5 - 15    Comment: Performed at [facility], [place] [address], [place], [place] [address]  Sedimentation rate     Status: None   Collection Time: 05/29/22  4:44 AM  Result Value Ref Range   Sed Rate 11 0 - 16 mm/hr    Comment: Performed at [facility], [place] [address], [place], [place] [address]  Magnesium     Status: None   Collection Time: 05/29/22  4:44 AM  Result Value Ref Range   Magnesium 2.3 1.7 - 2.4 mg/dL    Comment: Performed at [facility], [place] [address] [place], [place] [address]    MR LUMBAR SPINE W WO CONTRAST  Result Date: 05/29/2022 CLINICAL DATA:  72 year old male with multiple myeloma.  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 EXAM: MRI LUMBAR SPINE WITHOUT AND WITH CONTRAST TECHNIQUE: Multiplanar and multiecho pulse sequences of the lumbar spine were obtained without and with intravenous contrast. CONTRAST:  13m GADAVIST GADOBUTROL 1 MMOL/ML IV SOLN thoracic in conjunction with contrast enhanced imaging of the chest, abdomen, and pelvis reported separately. COMPARISON:  Thoracic MRI today reported  separately. Prior lumbar MRI 06/25/2019. FINDINGS: Segmentation: Normal, concordant with the thoracic numbering today and the same designation used in 2021. Alignment:  Stable lumbar vertebral height and alignment since 2021. Vertebrae: Confluent up to 2.3 cm tumor in the left L1 posterior elements including the pedicle (series 11, image 7). Small 8 mm tumor in the inferior L2 body. And larger 2 cm tumor in the left L2 pedicle. Subcentimeter tumor inferior L3 vertebral body. Larger 10 mm tumor posterior L4 vertebral body. L5 small 9 mm and smaller tumor in the right L5 body. Scattered subcentimeter left sacral ala tumor. These lesions are new since 2021. No lumbar or visible sacral pathologic fracture. No extraosseous or epidural tumor identified. Conus medullaris and cauda equina: Conus extends to the L1 level. No lower spinal cord or conus signal abnormality. No abnormal intradural enhancement. No dural thickening identified. Paraspinal and other soft tissues: Negative for age visible abdominal viscera. Negative lumbar paraspinal soft tissues. Disc levels: Lumbar spine degeneration not significantly changed since 2021 Aside from new moderate size central disc protrusion at L3-L4 (series 11, image 22) now resulting in mild to moderate spinal stenosis at that level. IMPRESSION: 1. Generally small Multiple Myeloma lesions scattered at all lumbar levels and in the visible left sacral ala. Largest lumbar tumor is 2.3 cm in the  left L1 posterior elements and pedicle. No lumbar extraosseous or epidural tumor extension, or pathologic fracture. 2. 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 Electronically Signed   By: Genevie Ann M.D.   On: 05/29/2022 12:22   MR THORACIC SPINE W WO CONTRAST  Result Date: 05/29/2022 CLINICAL DATA:  72 year old male with multiple myeloma. 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 EXAM: MRI THORACIC WITHOUT AND WITH CONTRAST TECHNIQUE: Multiplanar and multiecho pulse sequences of the thoracic spine were obtained without and with intravenous contrast. CONTRAST:  45m GADAVIST GADOBUTROL 1 MMOL/ML IV SOLN COMPARISON:  Cervical spine MRI without and with contrast 05/27/2022. FINDINGS: Limited cervical spine imaging: Stable compared to the recent 05/27/2022 exam. Thoracic spine segmentation:  Appears to be normal. Alignment: Exaggerated upper thoracic kyphosis and part related to the T2 pathologic lesion. Vertebrae: T1 better demonstrated on the recent cervical comparison. Highly abnormal T2 level with retropulsed and epidural tumor. Subsequent cord compression which does not appear significantly changed since 05/27/2022 (series 22, image 9, series 23, images 7 and 8). No definite cord edema or myelomalacia. Bulky ventral and left lateral extraosseous tumor extension as previously noted. Tumor in the T2 lamina and spinous process also. See series 26, image 9). Mild right T3 posteroinferior vertebral body tumor tracking toward the pedicle. No pathologic fracture. No tumor identified at T4, T5. However, there is posterior right 5th rib tumor on series 26, image 26. Similar small 5 mm enhancing tumor in the left T6 inferior body, endplate. No tumor identified at T7. However, there is posterior right 7th rib tumor  (series 27, image 5). T8 12 mm tumor in the anterior body on the right side. No pathologic fracture. No T9 tumor. T10 inferior endplate 12 mm tumor with subtle associated inferior endplate compression. T11 bulky 18 mm midline lamina and spinous process tumor, but no extraosseous extension. No T12 tumor. Lumbar spine detailed separately. Cord: Normal spinal canal patency and thoracic cord below the T2-T3 disc level. Cord signal and morphology remains normal  to the conus at L1. No abnormal intradural enhancement. No definite dural thickening. Paraspinal and other soft tissues: T2 paraspinal tumor eccentric to the left as above. Small layering pleural effusions. Negative visible other chest and upper abdominal viscera. Disc levels: Mild for age underlying thoracic spine degeneration. No degenerative spinal stenosis. IMPRESSION: 1. 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 No change compared to 05/27/2022. No cord edema or myelomalacia identified. 2. Small tumor foci at most other thoracic levels, with no other thoracic extraosseous or epidural tumor extension. Posterior rib involvement intermittently noted. 3. Small layering pleural effusions. 4. Lumbar MRI today is reported separately. Electronically Signed   By: Genevie Ann M.D.   On: 05/29/2022 12:11   MR CERVICAL SPINE W WO CONTRAST  Addendum Date: 05/28/2022   ADDENDUM REPORT: 05/28/2022 16:46 ADDENDUM: Critical Value/emergent results were called by telephone at the time of interpretation on 05/28/2022 at 4:40 pm to provider on-call Heath Lark, who verbally acknowledged these results. Electronically Signed   By: Richardean Sale M.D.   On: 05/28/2022 16:46   Result Date: 05/28/2022 CLINICAL DATA:  Multiple myeloma. Increasing neck pain/tenderness with limited range of motion. EXAM: MRI CERVICAL SPINE WITHOUT AND WITH CONTRAST TECHNIQUE:  Multiplanar and multiecho pulse sequences of the cervical spine, to include the craniocervical junction and cervicothoracic junction, were obtained without and with intravenous contrast. CONTRAST:  32m GADAVIST GADOBUTROL 1 MMOL/ML IV SOLN COMPARISON:  Radiographs of the cervical and thoracic spine 05/13/2022. Chest CTA 03/15/2022. Cervical MRI 03/06/2020. FINDINGS: Alignment: The alignment of the cervical spine is anatomic, although progressive kyphosis has developed at T2 secondary to a pathologic fracture. Vertebrae: As demonstrated on previous chest CTA, there are lytic lesions in the thoracic spine with associated enhancement following contrast. A large lesion at T2 has progressed and is associated with a new marked pathologic fracture with osseous retropulsion by 6 mm. Resulting cord compression. There is near complete replacement of the T2 vertebral body by tumor, measuring up to 4.8 x 4.0 cm transverse on axial image 47/12. There are additional smaller myelomatous lesions within the T1 vertebral body, within the right C7 and right T3 articulating facets. No other pathologic fractures are identified. Cord: The cervical cord is normal in signal and caliber. There is posterior displacement and mild compression of the thoracic cord at T2 by the pathologic fracture described above. Posterior Fossa, vertebral arteries, paraspinal tissues: Linear enhancement within the right cerebellar hemisphere is likely due to an incidental venous angioma. No associated significant linear T2 hyperintensity. The visualized posterior fossa is otherwise unremarkable.Bilateral vertebral artery flow voids. No significant paraspinal findings. Disc levels: C2-3: Bilateral facet hypertrophy contributes to chronic left foraminal narrowing. No cord deformity. C3-4: The disc appears normal. The left facet joint appears ankylosed. Chronic moderate left foraminal narrowing. C4-5: Chronic disc bulging with asymmetric uncinate spurring on the  right and bilateral facet hypertrophy. Stable mild spinal stenosis without cord deformity. Moderate right-greater-than-left foraminal narrowing is similar to the previous study. C5-6: Chronic spondylosis with posterior osteophytes covering diffusely bulging disc material. Mild facet hypertrophy. Stable mild spinal stenosis and mild foraminal narrowing bilaterally. C6-7: Chronic spondylosis with posterior osteophytes covering diffusely bulging disc material. Mild bilateral facet hypertrophy. Mild foraminal narrowing bilaterally. C7-T1: Normal interspace. As above, pathologic fracture at T2 with significant osseous retropulsion and resulting cord compression. There is moderate left greater than right foraminal narrowing at the T1-2 and T2-3 disc space levels. IMPRESSION:  1. Interval progression of multiple myeloma in the thoracic spine compared with previous chest CT of 10 weeks ago. There is a new pathologic fracture at T2 with significant osseous retropulsion and resulting cord compression. No abnormal cord signal identified. 2. Additional smaller lesions within the T1 vertebral body and right C7 and T3 articulating facets. No other pathologic fractures identified. 3. Mild multilevel cervical spondylosis with resulting mild spinal stenosis and mild to moderate foraminal narrowing at multiple levels as described, similar to previous MRI. No cord compression in the cervical spine. Electronically Signed: By: Richardean Sale M.D. On: 05/28/2022 16:11    Review of Systems  Constitutional:  Positive for activity change.  HENT: Negative.    Eyes: Negative.   Respiratory: Negative.    Cardiovascular: Negative.   Gastrointestinal: Negative.   Endocrine: Negative.   Genitourinary: Negative.   Musculoskeletal:  Positive for back pain and neck pain.  Skin: Negative.   Allergic/Immunologic: Negative.   Neurological: Negative.   Hematological: Negative.   Psychiatric/Behavioral: Negative.     Blood pressure (!)  143/63, pulse 69, temperature 98.3 F (36.8 C), temperature source Oral, resp. rate (!) 23, height '5\' 10"'$  (1.778 m), weight 110.9 kg, SpO2 98 %. Physical Exam Constitutional:      Appearance: Normal appearance. He is normal weight.  HENT:     Head: Normocephalic and atraumatic.     Right Ear: External ear normal.     Left Ear: External ear normal.     Nose: Nose normal.     Mouth/Throat:     Mouth: Mucous membranes are moist.     Pharynx: Oropharynx is clear.  Eyes:     Extraocular Movements: Extraocular movements intact.     Pupils: Pupils are equal, round, and reactive to light.  Cardiovascular:     Rate and Rhythm: Normal rate and regular rhythm.  Pulmonary:     Effort: Pulmonary effort is normal.  Abdominal:     General: Abdomen is flat.  Musculoskeletal:        General: Tenderness present.     Cervical back: Normal range of motion.  Skin:    General: Skin is warm and dry.     Findings: Rash present.  Neurological:     General: No focal deficit present.     Mental Status: He is alert and oriented to person, place, and time.     Cranial Nerves: Cranial nerves 2-12 are intact.     Sensory: Sensory deficit present.     Motor: Motor function is intact.     Coordination: Coordination is intact.     Deep Tendon Reflexes: Babinski sign absent on the right side. Babinski sign absent on the left side.     Comments: Proprioception intact     Assessment/Plan: Travis [name] is a 72 y.o. male With a long history of multiple myeloma. The myeloma was thought to be well controlled, but a T2 lesion which was initially identified in November on a CT angiogram Chest has progressed and has essentially replaced the vertebral body of T2. Dr. Sondra Come and I spoke about patient management, and I was comfortable with radiating him with less than a full dose now, and spinal stabilization with tumor resection next week. Travis [name] has a normal motor exam, preexisting peripheral neuropathy in his  feet, and significant pain in the neck and shoulder.   Ashok Pall 05/29/2022, 4:38 PM

## 2022-05-29 NOTE — Consult Note (Signed)
$'@LOGO'G$ @  Radiation Oncology         [phone]) [phone] ________________________________  Name: Travis [name] MRN: [id number]  Date: 05/28/2022  DOB: January 09, 1951  Inpatient re-evaluation note  CC: [name], MD  No ref. provider found    ICD-10-CM   1. Multiple myeloma, remission status unspecified ([facility])  C90.00     2. Pathologic compression fracture of spine, initial encounter ([facility])  M48.50XA     3. Other constipation  K59.09 senna-docusate (Senokot-S) tablet 2 tablet    polyethylene glycol (MIRALAX / GLYCOLAX) packet 17 g      Diagnosis:   Multiple myeloma  Interval Since Last Radiation:  4 months   Cancer Staging:  72 year old gentleman multiple myeloma with lytic lesions in the right humerus, right ulna and right 6th rib   Intent: Palliative  Radiation Treatment Dates: 01/06/2022 through 01/19/2022 Site Technique Total Dose (Gy) Dose per Fx (Gy) Completed Fx Beam Energies  Humerus, Right: Ext_R_Humerus Complex 20/20 2 10/10 6X, 10X  Arm, Right: Ext_R_Forearm Complex 20/20 2 10/10 6X  Chest, Right: Chest_R_Rib Complex 20/20 2 10/10 10X     Narrative: Mr. Bhola is a 72 year old gentleman with a prior history of palliative radiation therapy as documented above.  He was treated by Dr. Tyler Pita back in the early fall of last year.  Late last year the patient began experiencing some mild pain in his upper back.  This is progressed significantly over the past several weeks.  He was seen by Dr. Hulan Saas who felt the patient may have progression of his myeloma.  The patient subsequently underwent an MRI of the cervical spine which showed probable cord compression in the upper thoracic spine at T2.  the patient was subsequently admitted and placed on high-dose steroids.  MRI this morning confirmed a cord compression at T2.  Radiation oncology has been consulted for consideration for urgent treatment.                      ALLERGIES:  is allergic to linezolid and  vancomycin.  Meds: Current Facility-Administered Medications  Medication Dose Route Frequency Provider Last Rate Last Admin   acetaminophen (TYLENOL) tablet 650 mg  650 mg Oral Q6H PRN Crosley, Debby, MD       Or   acetaminophen (TYLENOL) suppository 650 mg  650 mg Rectal Q6H PRN Crosley, Debby, MD       acyclovir (ZOVIRAX) tablet 400 mg  400 mg Oral BID Blanchie Dessert, MD   400 mg at 05/29/22 0836   amiodarone (PACERONE) tablet 200 mg  200 mg Oral Daily Blanchie Dessert, MD   200 mg at 05/29/22 0836   amLODipine (NORVASC) tablet 10 mg  10 mg Oral Daily Crosley, Debby, MD   10 mg at 05/29/22 0836   apixaban (ELIQUIS) tablet 5 mg  5 mg Oral BID Blanchie Dessert, MD   5 mg at 05/29/22 0836   dexamethasone (DECADRON) injection 4 mg  4 mg Intravenous Q6H Plunkett, Whitney, MD   4 mg at 41/93/79 0240   folic acid (FOLVITE) tablet 1 mg  1 mg Oral Daily Crosley, Debby, MD   1 mg at 05/29/22 0836   gabapentin (NEURONTIN) capsule 600 mg  600 mg Oral BID Blanchie Dessert, MD   600 mg at 05/29/22 0837   hydrALAZINE (APRESOLINE) tablet 37.5 mg  37.5 mg Oral TID Quintella Baton, MD   37.5 mg at 05/29/22 0836   HYDROcodone-acetaminophen (NORCO/VICODIN) 5-325 MG per tablet 1-2  tablet  1-2 tablet Oral Q6H PRN Quintella Baton, MD   1 tablet at 05/29/22 0835   lisinopril (ZESTRIL) tablet 40 mg  40 mg Oral Daily Crosley, Debby, MD   40 mg at 05/29/22 0837   morphine (PF) 2 MG/ML injection 2 mg  2 mg Intravenous Q4H PRN Crosley, Debby, MD       ondansetron (ZOFRAN) tablet 4 mg  4 mg Oral Q6H PRN Crosley, Debby, MD       Or   ondansetron (ZOFRAN) injection 4 mg  4 mg Intravenous Q6H PRN Crosley, Debby, MD       polyethylene glycol (MIRALAX / GLYCOLAX) packet 17 g  17 g Oral Daily Alvy Bimler, Ni, MD   17 g at 05/29/22 0841   senna-docusate (Senokot-S) tablet 2 tablet  2 tablet Oral BID Heath Lark, MD   2 tablet at 05/29/22 0835   ROS: He denies any weakness in his lower extremities.  Ambulates without  difficulty.  He denies any bowel or bladder incontinence or problems with urinary retention.  He relates some mild constipation likely related to narcotics.  He reports some pain radiating into his upper chest area bilaterally as well as lower down in his mid thoracic spine.  He denies any numbness in his lower extremities.  He reports peripheral neuropathy in his feet which has been a chronic problem.  Physical Findings: The patient is in no acute distress. Patient is alert and oriented.  Lying comfortably in his hospital bed.  He is accompanied by his wife.  They report anniversary today and have been married for many years.  height is '5\' 10"'$  (1.778 m) and weight is 244 lb 8 oz (110.9 kg). His oral temperature is 98.2 F (36.8 C). His blood pressure is 145/75 (abnormal) and his pulse is 66. His respiration is 19 and oxygen saturation is 98%. .  The lungs are clear.  The heart has a regular rhythm and rate.  The abdomen is soft and nontender with normal bowel sounds.  On neurological examination motor strength is 5 out of 5 in the proximal and distal muscle groups of the lower extremities.  Sensation intact to pinprick and light touch in lower extremities.  Lab Findings: Lab Results  Component Value Date   WBC 6.5 05/29/2022   HGB 13.9 05/29/2022   HCT 40.8 05/29/2022   MCV 96.7 05/29/2022   PLT 181 05/29/2022    Radiographic Findings: MR LUMBAR SPINE W WO CONTRAST  Result Date: 05/29/2022 CLINICAL DATA:  72 year old male with multiple myeloma. 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 EXAM: MRI LUMBAR SPINE WITHOUT AND WITH CONTRAST TECHNIQUE: Multiplanar and multiecho pulse sequences of the lumbar spine were obtained without and with intravenous contrast. CONTRAST:  57m GADAVIST GADOBUTROL 1 MMOL/ML IV SOLN thoracic in conjunction with contrast enhanced imaging of the chest, abdomen, and pelvis reported separately.  COMPARISON:  Thoracic MRI today reported separately. Prior lumbar MRI 06/25/2019. FINDINGS: Segmentation: Normal, concordant with the thoracic numbering today and the same designation used in 2021. Alignment:  Stable lumbar vertebral height and alignment since 2021. Vertebrae: Confluent up to 2.3 cm tumor in the left L1 posterior elements including the pedicle (series 11, image 7). Small 8 mm tumor in the inferior L2 body. And larger 2 cm tumor in the left L2 pedicle. Subcentimeter tumor inferior L3 vertebral body. Larger 10 mm tumor posterior L4 vertebral body. L5 small 9 mm and smaller tumor  in the right L5 body. Scattered subcentimeter left sacral ala tumor. These lesions are new since 2021. No lumbar or visible sacral pathologic fracture. No extraosseous or epidural tumor identified. Conus medullaris and cauda equina: Conus extends to the L1 level. No lower spinal cord or conus signal abnormality. No abnormal intradural enhancement. No dural thickening identified. Paraspinal and other soft tissues: Negative for age visible abdominal viscera. Negative lumbar paraspinal soft tissues. Disc levels: Lumbar spine degeneration not significantly changed since 2021 Aside from new moderate size central disc protrusion at L3-L4 (series 11, image 22) now resulting in mild to moderate spinal stenosis at that level. IMPRESSION: 1. Generally small Multiple Myeloma lesions scattered at all lumbar levels and in the visible left sacral ala. Largest lumbar tumor is 2.3 cm in the left L1 posterior elements and pedicle. No lumbar extraosseous or epidural tumor extension, or pathologic fracture. 2. 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 Electronically Signed   By: Genevie Ann M.D.   On: 05/29/2022 12:22   MR THORACIC SPINE W WO CONTRAST  Result Date: 05/29/2022 CLINICAL DATA:  72 year old male with  multiple myeloma. 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 EXAM: MRI THORACIC WITHOUT AND WITH CONTRAST TECHNIQUE: Multiplanar and multiecho pulse sequences of the thoracic spine were obtained without and with intravenous contrast. CONTRAST:  57m GADAVIST GADOBUTROL 1 MMOL/ML IV SOLN COMPARISON:  Cervical spine MRI without and with contrast 05/27/2022. FINDINGS: Limited cervical spine imaging: Stable compared to the recent 05/27/2022 exam. Thoracic spine segmentation:  Appears to be normal. Alignment: Exaggerated upper thoracic kyphosis and part related to the T2 pathologic lesion. Vertebrae: T1 better demonstrated on the recent cervical comparison. Highly abnormal T2 level with retropulsed and epidural tumor. Subsequent cord compression which does not appear significantly changed since 05/27/2022 (series 22, image 9, series 23, images 7 and 8). No definite cord edema or myelomalacia. Bulky ventral and left lateral extraosseous tumor extension as previously noted. Tumor in the T2 lamina and spinous process also. See series 26, image 9). Mild right T3 posteroinferior vertebral body tumor tracking toward the pedicle. No pathologic fracture. No tumor identified at T4, T5. However, there is posterior right 5th rib tumor on series 26, image 26. Similar small 5 mm enhancing tumor in the left T6 inferior body, endplate. No tumor identified at T7. However, there is posterior right 7th rib tumor (series 27, image 5). T8 12 mm tumor in the anterior body on the right side. No pathologic fracture. No T9 tumor. T10 inferior endplate 12 mm tumor with subtle associated inferior endplate compression. T11 bulky 18 mm midline lamina and spinous process tumor, but no extraosseous extension. No T12 tumor. Lumbar spine detailed separately. Cord: Normal spinal canal patency and thoracic cord below the T2-T3 disc level. Cord signal and morphology remains normal  to the conus at L1. No abnormal intradural enhancement. No definite dural thickening. Paraspinal and other soft tissues: T2 paraspinal tumor eccentric to the left as above. Small layering pleural effusions. Negative visible other chest and upper abdominal viscera. Disc levels: Mild for age underlying thoracic spine degeneration. No degenerative spinal stenosis. IMPRESSION: 1. 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 No change compared to 05/27/2022. No cord edema or myelomalacia identified. 2. Small tumor foci at most  other thoracic levels, with no other thoracic extraosseous or epidural tumor extension. Posterior rib involvement intermittently noted. 3. Small layering pleural effusions. 4. Lumbar MRI today is reported separately. Electronically Signed   By: Genevie Ann M.D.   On: 05/29/2022 12:11   MR CERVICAL SPINE W WO CONTRAST  Addendum Date: 05/28/2022   ADDENDUM REPORT: 05/28/2022 16:46 ADDENDUM: Critical Value/emergent results were called by telephone at the time of interpretation on 05/28/2022 at 4:40 pm to provider on-call Heath Lark, who verbally acknowledged these results. Electronically Signed   By: Richardean Sale M.D.   On: 05/28/2022 16:46   Result Date: 05/28/2022 CLINICAL DATA:  Multiple myeloma. Increasing neck pain/tenderness with limited range of motion. EXAM: MRI CERVICAL SPINE WITHOUT AND WITH CONTRAST TECHNIQUE: Multiplanar and multiecho pulse sequences of the cervical spine, to include the craniocervical junction and cervicothoracic junction, were obtained without and with intravenous contrast. CONTRAST:  24m GADAVIST GADOBUTROL 1 MMOL/ML IV SOLN COMPARISON:  Radiographs of the cervical and thoracic spine 05/13/2022. Chest CTA 03/15/2022. Cervical MRI 03/06/2020. FINDINGS: Alignment: The alignment of the cervical spine is anatomic, although progressive kyphosis has  developed at T2 secondary to a pathologic fracture. Vertebrae: As demonstrated on previous chest CTA, there are lytic lesions in the thoracic spine with associated enhancement following contrast. A large lesion at T2 has progressed and is associated with a new marked pathologic fracture with osseous retropulsion by 6 mm. Resulting cord compression. There is near complete replacement of the T2 vertebral body by tumor, measuring up to 4.8 x 4.0 cm transverse on axial image 47/12. There are additional smaller myelomatous lesions within the T1 vertebral body, within the right C7 and right T3 articulating facets. No other pathologic fractures are identified. Cord: The cervical cord is normal in signal and caliber. There is posterior displacement and mild compression of the thoracic cord at T2 by the pathologic fracture described above. Posterior Fossa, vertebral arteries, paraspinal tissues: Linear enhancement within the right cerebellar hemisphere is likely due to an incidental venous angioma. No associated significant linear T2 hyperintensity. The visualized posterior fossa is otherwise unremarkable.Bilateral vertebral artery flow voids. No significant paraspinal findings. Disc levels: C2-3: Bilateral facet hypertrophy contributes to chronic left foraminal narrowing. No cord deformity. C3-4: The disc appears normal. The left facet joint appears ankylosed. Chronic moderate left foraminal narrowing. C4-5: Chronic disc bulging with asymmetric uncinate spurring on the right and bilateral facet hypertrophy. Stable mild spinal stenosis without cord deformity. Moderate right-greater-than-left foraminal narrowing is similar to the previous study. C5-6: Chronic spondylosis with posterior osteophytes covering diffusely bulging disc material. Mild facet hypertrophy. Stable mild spinal stenosis and mild foraminal narrowing bilaterally. C6-7: Chronic spondylosis with posterior osteophytes covering diffusely bulging disc material.  Mild bilateral facet hypertrophy. Mild foraminal narrowing bilaterally. C7-T1: Normal interspace. As above, pathologic fracture at T2 with significant osseous retropulsion and resulting cord compression. There is moderate left greater than right foraminal narrowing at the T1-2 and T2-3 disc space levels. IMPRESSION: 1. Interval progression of multiple myeloma in the thoracic spine compared with previous chest CT of 10 weeks ago. There is a new pathologic fracture at T2 with significant osseous retropulsion and resulting cord compression. No abnormal cord signal identified. 2. Additional smaller lesions within the T1 vertebral body and right C7 and T3 articulating facets. No other pathologic fractures identified. 3. Mild multilevel cervical spondylosis with resulting mild spinal stenosis and mild to moderate foraminal narrowing at multiple levels as described, similar to previous MRI. No cord compression in the  cervical spine. Electronically Signed: By: Richardean Sale M.D. On: 05/28/2022 16:11   DG Thoracic Spine 2 View  Result Date: 05/15/2022 CLINICAL DATA:  Neck/upper back pain. EXAM: THORACIC SPINE 2 VIEWS COMPARISON:  Cervical spine radiographs-earlier same day FINDINGS: Evaluation of the superior aspect of the thoracic spine is degraded secondary to overlying osseous and soft tissue structures. Normal alignment of the thoracic spine. No definite anterolisthesis or retrolisthesis. No significant scoliotic curvature. Thoracic vertebral body heights appear preserved. 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 Limited visualization of the adjacent thorax is normal given obliquity and technique. Regional soft tissues appear normal. 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 Electronically Signed   By: Sandi Mariscal M.D.   On: 05/15/2022 15:43   DG Cervical Spine 2 or 3 views  Result Date: 05/15/2022 CLINICAL DATA:  Neck/upper thoracic spine pain. EXAM: CERVICAL SPINE - 2-3 VIEW COMPARISON:   Thoracic spine radiographs-earlier same day FINDINGS: C1 to the superior endplate of C5 is seen on the provided radiograph with suboptimal visualization of the lower cervical spine as well as the cervicothoracic junction on the provided swimmer's radiograph. Accentuated cervical lordosis. Provided open mouth odontoid radiograph is degraded due to obliquity and overlying osseous structures. Visualized cervical vertebral body heights appear preserved. Prevertebral soft tissues appear normal. Mild DDD of C4-C5 with disc space height loss, endplate irregularity and anteriorly directed disc osteophyte complex at this location. Regional soft tissues appear normal. Limited visualization of the lung apices is normal given obliquity and technique. IMPRESSION: 1. Degraded examination secondary to patient's inability to tolerate standard positioning. 2. Suspected mild DDD of C4-C5. Further evaluation with cervical spine MRI could be performed as indicated. Electronically Signed   By: Sandi Mariscal M.D.   On: 05/15/2022 15:41    Impression: Progressive multiple myeloma with cord compression at T2.  I spoke with Dr. Christella Noa in neurosurgery and the patient may be a potential candidate for surgery although surgery may be quite extensive given the involvement of myeloma at T1 and T3.  In the meantime we will proceed with urgent radiation therapy.  The patient will be brought down to radiation oncology for treatment later this afternoon.  The patient will receive 3 Gray in 1 fraction to the upper thoracic spine.  He will likely resume radiation therapy after more detailed planning on Monday we will proceed with 10 treatments if no surgery is planned or proceed with a limited course of radiation therapy if surgery as planned next week.  I discussed this treatment with patient and his wife.  We discussed the course of treatment as well as anticipated side effects with treating the upper thoracic spine and potential long-term  effects.  He appears to understand the treatment and agrees with urgent radiation therapy.   Plan: Urgent radiation therapy today.  Final treatment plans depending further evaluation by neurosurgery.  ____________________________________ Gery Pray, MD

## 2022-05-29 NOTE — H&P (Addendum)
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  In July of last year it became more aggressive and had a lesion in his right humerus that had to be orthopedically repaired. Since the second week in December, he has had aching in his upper back that has gotten progressively worse in the last few weeks.  He was sent to PT that did not help.  Yesterday he saw Charlann Boxer in sports medicine, he had an MRI of the cervical spine done which showed interval progression of multiple myeloma in the thoracic spine compared with previous chest CT of 10 weeks ago. There is a new pathologic fracture at T2 with significant osseous retropulsion and resulting cord compression. No abnormal cord signal identified. Additional smaller lesions within the T1 vertebral body and right C7 and T3 articulating facets  He went to [facility], he was called by his oncologist Dr. Alvy Bimler and told he needed to be hospitalized or emergent radiation.  Currently patient is maintained on chemotherapy every 3 weeks as well as oral hemotherapy  Review of Systems:  The patient denies anorexia, fever, weight loss,, vision loss, decreased hearing, hoarseness, chest pain, syncope, dyspnea on exertion, peripheral edema, balance deficits, hemoptysis, abdominal pain, melena, hematochezia, severe indigestion/heartburn, hematuria, incontinence, genital sores, muscle weakness, suspicious skin lesions, transient blindness, difficulty walking, depression, unusual weight change, abnormal bleeding, enlarged lymph nodes, angioedema, and breast masses.  Past Medical History: Past Medical History:  Diagnosis  Date   Arthritis    "comes and goes; mostly in hands, occasionally elbow" (41/01/3789)   Complication of anesthesia    "just wanted to sleep alot  for couple days S/P VARICOCELE OR" (04/05/2017)   DVT (deep venous thrombosis) (HCC)    LLE   Dysrhythmia    PVCs    GERD (gastroesophageal reflux disease)    History of hiatal hernia    Hypertension    Impaired glucose tolerance    Multiple myeloma (HCC)    Obesity (BMI 30-39.9) 07/26/2016   OSA on CPAP 07/26/2016   Mild with AHI 12.5/hr now on CPAP at 14cm H2O   PAF (paroxysmal atrial fibrillation) ([place])    s/p PVI Ablation in 2018 // Flecainide Rx // Echo 8/21: EF 60-65, no RWMA, mild LVH, normal RV SF, moderate RVE, mild BAE, trivial MR, mild dilation of aortic root (40 mm)   Pneumonia    "may have had walking pneumonia a few years ago" (04/05/2017)   Pre-diabetes    Thrombophlebitis    Past Surgical History:  Procedure Laterality Date   ATRIAL FIBRILLATION ABLATION  04/05/2017   ATRIAL FIBRILLATION ABLATION N/A 04/05/2017   Procedure: ATRIAL FIBRILLATION ABLATION;  Surgeon: Thompson Grayer, MD;  Location: [facility];  Service: Cardiovascular;  Laterality: N/A;   BONE BIOPSY Right 12/17/2021   Procedure: BONE BIOPSY;  Surgeon: Hiram Gash, MD;  Location: [place];  Service: Orthopedics;  Laterality: Right;   COMPLETE RIGHT HIP REPLACMENT  01/19/2019   EVALUATION UNDER ANESTHESIA WITH HEMORRHOIDECTOMY N/A 02/24/2017   Procedure: EXAM UNDER ANESTHESIA WITH  HEMORRHOIDECTOMY;  Surgeon: Michael Boston, MD;  Location: [facility];  Service: General;  Laterality: N/A;   EXCISIONAL  TOTAL HIP ARTHROPLASTY WITH ANTIBIOTIC SPACERS Right 09/25/2020   Procedure: EXCISIONAL TOTAL HIP ARTHROPLASTY WITH ANTIBIOTIC SPACERS;  Surgeon: Paralee Cancel, MD;  Location: [facility];  Service: Orthopedics;  Laterality: Right;   FLEXIBLE SIGMOIDOSCOPY N/A 04/15/2015   Procedure:  UNSEDATED FLEXIBLE SIGMOIDOSCOPY;  Surgeon: [name], MD;   Location: [facility];  Service: Endoscopy;  Laterality: N/A;   HUMERUS IM NAIL Right 12/17/2021   Procedure: INTRAMEDULLARY (IM) NAIL HUMERAL;  Surgeon: Hiram Gash, MD;  Location: [place];  Service: Orthopedics;  Laterality: Right;   KNEE ARTHROSCOPY Left ~ 2016   REIMPLANTATION OF TOTAL HIP Right 12/25/2020   Procedure: REIMPLANTATION/REVISION OF RIGHT TOTAL HIP VERSUS REPEAT IRRIGATION AND DEBRIDEMENT;  Surgeon: Paralee Cancel, MD;  Location: [facility];  Service: Orthopedics;  Laterality: Right;   TESTICLE SURGERY  1988   VARICOCELE   TONSILLECTOMY     VARICOSE VEIN SURGERY Bilateral     Medications: Prior to Admission medications   Medication Sig Start Date End Date Taking? Authorizing Provider  acetaminophen (TYLENOL) 325 MG tablet Take 2 tablets (650 mg total) by mouth every 6 (six) hours as needed. 03/15/22  Yes Jeanell Sparrow, DO  acyclovir (ZOVIRAX) 400 MG tablet Take 1 tablet (400 mg total) by mouth 2 (two) times daily. 12/10/21  Yes Orson Slick, MD  amiodarone (PACERONE) 200 MG tablet Take 1 tablet (200 mg total) by mouth daily. 05/12/22  Yes Vickie Epley, MD  amLODipine (NORVASC) 10 MG tablet Take 10 mg by mouth daily.    Yes [provider]  Ascorbic Acid (VITAMIN C) 100 MG tablet Take 100 mg by mouth daily.   Yes [provider]  cholecalciferol (VITAMIN D3) 25 MCG (1000 UNIT) tablet Take 1,000 Units by mouth daily.   Yes [provider]  clobetasol cream (TEMOVATE) 2.58 % Apply 1 Application topically daily as needed (rash). 08/05/21  Yes [provider]  Cyanocobalamin (VITAMIN B-12 PO) Take 1 tablet by mouth daily. Not sure the dosage   Yes [provider]  dexamethasone (DECADRON) 4 MG tablet Take 10 tablets (40 mg total) by mouth once a week. Patient taking differently: Take 40 mg by mouth every 14 (fourteen) days. 12/10/21  Yes Orson Slick, MD  ELIQUIS 5 MG TABS tablet TAKE ONE TABLET BY MOUTH TWICE  A DAY 04/09/22  Yes Fay Records, MD  fluticasone [facility]) 50 MCG/ACT nasal spray Place 1 spray into both nostrils daily as needed for allergies or rhinitis.   Yes [provider]  folic acid (FOLVITE) 1 MG tablet Take 1 mg by mouth daily. 11/12/19  Yes [provider]  gabapentin (NEURONTIN) 300 MG capsule TAKE TWO CAPSULES BY MOUTH TWICE A DAY 03/29/22  Yes Patel, Donika K, DO  hydrALAZINE (APRESOLINE) 25 MG tablet Take 1.5 tablets (37.5 mg total) by mouth 3 (three) times daily. 05/12/22  Yes Vickie Epley, MD  lenalidomide (REVLIMID) 25 MG capsule Take 1 capsule (25 mg total) by mouth daily. Take 21 days on, 7 days off, repeat every 28 days. Fanny Dance # [id number] Date obtained: 05/12/21 05/12/22  Yes Ledell Peoples IV, MD  lidocaine (LIDODERM) 5 % Place 1 patch onto the skin daily as needed. Remove & Discard patch within 12 hours or as directed by MD 03/15/22  Yes Wynona Dove A, DO  lisinopril (ZESTRIL) 40 MG tablet TAKE ONE TABLET BY MOUTH ONE TIME DAILY 08/12/21  Yes Fay Records, MD  oxymetazoline (AFRIN) 0.05 % nasal spray Place 1 spray into both nostrils 2 (two) times daily as needed for congestion.   Yes [provider]  potassium chloride SA (KLOR-CON M) 20 MEQ tablet TAKE ONE TABLET BY MOUTH ONE TIME DAILY, TAKE 40 MG (2 TABLETS) ON DAYS THAT YOU ALSO TAKE LASIX (FUROSEMIDE) Patient taking differently: Take 30 mEq by mouth daily. 10/28/21  Yes Fay Records, MD  predniSONE (DELTASONE) 20 MG tablet Take 2 tablets (40 mg total) by mouth daily with breakfast. 05/27/22  Yes Lyndal Pulley, DO  prochlorperazine (COMPAZINE) 10 MG tablet Take 1 tablet (10 mg total) by mouth every 6 (six) hours as needed for nausea or vomiting. 12/10/21  Yes Orson Slick, MD  psyllium (METAMUCIL) 58.6 % packet Take 1 packet by mouth daily as needed (constipation).   Yes [provider]  traMADol (ULTRAM) 50 MG tablet Take 1 tablet (50 mg total) by mouth every 6 (six)  hours as needed. 05/18/22  Yes Dede Query T, PA-C  triamcinolone cream (KENALOG) 0.1 % Apply 1 application topically daily as needed (itching). 06/05/19  Yes [provider]  furosemide (LASIX) 40 MG tablet Take 40 mg by mouth daily as needed for fluid or edema. 11/16/21   [provider]  sodium chloride (OCEAN) 0.65 % SOLN nasal spray Place 1 spray into both nostrils as needed for congestion.    [provider]    Allergies:   Allergies  Allergen Reactions   Linezolid Other (See Comments)    Burning tongue    Vancomycin Rash    Social History:  reports that he has never smoked. He has never used smokeless tobacco. He reports current alcohol use of about 2.0 standard drinks of alcohol per week. He reports that he does not use drugs.  Family History: Family History  Problem Relation Age of Onset   Dementia Mother    Stroke Father    Atrial fibrillation Father    Hypertension Father    Hypertension Paternal Grandfather    Dementia Maternal Grandmother     Physical Exam: Vitals:   05/28/22 2040 05/28/22 2248 05/28/22 2335 05/29/22 0235  BP:  138/72 137/74 (!) 150/73  Pulse:  69 70 73  Resp:  '15 18 16  '$ Temp: 98.2 F (36.8 C) 97.9 F (36.6 C) 98 F (36.7 C) 98.4 F (36.9 C)  TempSrc: Oral Oral Oral Oral  SpO2:  99% 97% 98%  Weight:    110.9 kg  Height:    '5\' 10"'$  (1.778 m)    General:  Alert and oriented times three, well developed and nourished, no acute distress Eyes: PERRLA, pink conjunctiva, no scleral icterus ENT: Moist oral mucosa, neck supple, no thyromegaly Lungs: clear to ascultation, no wheeze, no crackles, no use of accessory muscles Cardiovascular: regular rate and rhythm, no regurgitation, no gallops, no murmurs. No carotid bruits, no JVD Abdomen: soft, positive BS, non-tender, non-distended, no organomegaly, not an acute abdomen GU: not examined Neuro: CN II - XII grossly intact, sensation intact Musculoskeletal: strength 5/5  all extremities, no clubbing, cyanosis or edema, mild tenderness along the thoracic spine Skin: no rash, no subcutaneous crepitation, no decubitus Psych: appropriate patient   Labs on Admission:  Recent Labs    05/27/22 1200  NA 140  K 3.7  CL 102  CO2 30  GLUCOSE 82  BUN 11  CREATININE 0.71  CALCIUM 8.7   Recent Labs    05/27/22 1200  AST 13  ALT 21  ALKPHOS 97  BILITOT 0.4  PROT 6.2  ALBUMIN 4.1    Recent Labs    05/27/22 1200  WBC 4.7  NEUTROABS 3.5  HGB 14.4  HCT 42.0  MCV 95.3  PLT 202.0    Radiological Exams on Admission: MR CERVICAL SPINE W WO CONTRAST  Addendum Date: 05/28/2022   ADDENDUM REPORT: 05/28/2022 16:46 ADDENDUM: Critical Value/emergent results were called by telephone at the time of interpretation on 05/28/2022 at 4:40 pm to provider on-call Heath Lark, who verbally acknowledged these results. Electronically Signed   By: Richardean Sale M.D.   On: 05/28/2022 16:46   Result Date: 05/28/2022 CLINICAL DATA:  Multiple myeloma. Increasing neck pain/tenderness with limited range of motion. EXAM: MRI CERVICAL SPINE WITHOUT AND WITH CONTRAST TECHNIQUE: Multiplanar and multiecho pulse sequences of the cervical spine, to include the craniocervical junction and cervicothoracic junction, were obtained without and with intravenous contrast. CONTRAST:  76m GADAVIST GADOBUTROL 1 MMOL/ML IV SOLN COMPARISON:  Radiographs of the cervical and thoracic spine 05/13/2022. Chest CTA 03/15/2022. Cervical MRI 03/06/2020. FINDINGS: Alignment: The alignment of the cervical spine is anatomic, although progressive kyphosis has developed at T2 secondary to a pathologic fracture. Vertebrae: As demonstrated on previous chest CTA, there are lytic lesions in the thoracic spine with associated enhancement following contrast. A large lesion at T2 has progressed and is associated with a new marked pathologic fracture with osseous retropulsion by 6 mm. Resulting cord compression. There is  near complete replacement of the T2 vertebral body by tumor, measuring up to 4.8 x 4.0 cm transverse on axial image 47/12. There are additional smaller myelomatous lesions within the T1 vertebral body, within the right C7 and right T3 articulating facets. No other pathologic fractures are identified. Cord: The cervical cord is normal in signal and caliber. There is posterior displacement and mild compression of the thoracic cord at T2 by the pathologic fracture described above. Posterior Fossa, vertebral arteries, paraspinal tissues: Linear enhancement within the right cerebellar hemisphere is likely due to an incidental venous angioma. No associated significant linear T2 hyperintensity. The visualized posterior fossa is otherwise unremarkable.Bilateral vertebral artery flow voids. No significant paraspinal findings. Disc levels: C2-3: Bilateral facet hypertrophy contributes to chronic left foraminal narrowing. No cord deformity. C3-4: The disc appears normal. The left facet joint appears ankylosed. Chronic moderate left foraminal narrowing. C4-5: Chronic disc bulging with asymmetric uncinate spurring on the right and bilateral facet hypertrophy. Stable mild spinal stenosis without cord deformity. Moderate right-greater-than-left foraminal narrowing is similar to the previous study. C5-6: Chronic spondylosis with posterior osteophytes covering diffusely bulging disc material. Mild facet hypertrophy. Stable mild spinal stenosis and mild foraminal narrowing bilaterally. C6-7: Chronic spondylosis with posterior osteophytes covering diffusely bulging disc material. Mild bilateral facet hypertrophy. Mild foraminal narrowing bilaterally. C7-T1: Normal interspace. As above, pathologic fracture at T2 with significant osseous retropulsion and resulting cord compression. There is moderate left greater than right foraminal narrowing at the T1-2 and T2-3 disc space levels. IMPRESSION: 1. Interval progression of multiple myeloma  in the thoracic spine compared with previous chest CT of 10 weeks ago. There is a new pathologic fracture at T2 with significant osseous retropulsion and resulting cord compression. No abnormal cord signal identified. 2. Additional smaller lesions within the T1 vertebral body and right C7 and T3 articulating facets. No other pathologic fractures identified. 3. Mild multilevel cervical spondylosis with resulting mild spinal stenosis and mild to moderate foraminal narrowing at multiple levels as described, similar to previous MRI. No  cord compression in the cervical spine. Electronically Signed: By: Richardean Sale M.D. On: 05/28/2022 16:11    Assessment/Plan Present on Admission:  Multiple myeloma (HCC)/cervical spine bone mets and pathologic fractures -Admit to MedSurg -T2 pathologic fracture lesion in T1 vertebral body and C7 and T3 articulating facets -IV Decadron every 6 hours per oncology recommendation -Pain meds as needed -Oncology Dr. Alvy Bimler has been messaged that her that patient has been admitted -Wife to bring patient for Revlimid -Resume patient's Neurontin   Hypertension -Stable, resume patient's Zestril and Norvasc   Atrial fibrillation ([place]) -Stable, repeat resume patient's amiodarone and Eliquis   OSA (obstructive sleep apnea) -Wife to bring patient's CPAP  Wyland Rastetter 05/29/2022, 3:49 AM

## 2022-05-29 NOTE — Progress Notes (Signed)
Travis [name]   DOB:1950-05-10   NL#:[id number]    ASSESSMENT & PLAN:  Multiple myeloma I reviewed numerous imaging studies with the patient The patient has CT angiogram of his chest in November for evaluation of atypical chest pain.  At that time, I could see lesion in the thoracic spine that was not mentioned in the report.  The same lesion is now causing risk of cord compression, noted on MRI. I recommend round-the-clock high-dose dexamethasone as scheduled I recommend MRI of the thoracic and lumbar spine to complete evaluation I will consult radiation oncologist for palliative radiation therapy  In terms of his treatment, the light chain studies fluctuate up and down a little bit but not necessarily mean that he progress on combination of daratumumab and Revlimid I will hold Revlimid while he is hospitalized for now I will defer treatment plan to Dr. Lorenso Courier next week  Back pain secondary to bone disease Imminent risk of cord compression We discussed the rationale of admission and round-the-clock dexamethasone I reviewed multiple imaging studies with the patient Currently, he has no neurological compromise except for intermittent neuropathic pain near the shoulder blade As above, we will complete evaluation with additional imaging studies I will consult radiation oncologist for palliative radiation therapy His pain is reasonably controlled right now  Other constipation I recommend scheduled laxatives  Discharge planning He is admitted due to imminent risk of neurological compromise from cord compression I recommend he stay until his radiation can be started by radiation oncologist, likely over the weekend  All questions were answered. The patient knows to call the clinic with any problems, questions or concerns.   The total time spent in the appointment was 60 minutes encounter with patients including review of chart and various tests results, discussions about plan of care and  coordination of care plan  Heath Lark, MD 05/29/2022 8:32 AM  Subjective:  I spoke with the patient yesterday.  This patient follows with Dr. Lorenso Courier in the outpatient clinic for multiple myeloma.  Over the past month and a half, he has progressive back pain with neuropathic sensation at the scapular region.  He underwent urgent MRI imaging which showed significant pathological fracture of his thoracic spine causing imminent risk of spinal cord injury.  He was directed to the emergency department and is being admitted for further management His pain is reasonably controlled right now We spent a lot of time reviewing multiple imaging studies and discussed treatment plan options He has chronic peripheral neuropathy but no new neurological deficits His pain is well-controlled today His only other complaint is constipation   Objective:  Vitals:   05/28/22 2335 05/29/22 0235  BP: 137/74 (!) 150/73  Pulse: 70 73  Resp: 18 16  Temp: 98 F (36.7 C) 98.4 F (36.9 C)  SpO2: 97% 98%    No intake or output data in the 24 hours ending 05/29/22 0832  GENERAL:alert, no distress and comfortable  NEURO: alert & oriented x 3 with fluent speech, no focal motor/sensory deficits   Labs:  Recent Labs    05/04/22 0944 05/11/22 1250 05/27/22 1200 05/29/22 0444  NA 142 139 140 136  K 3.6 3.3* 3.7 3.9  CL 104 103 102 103  CO2 34* '30 30 23  '$ GLUCOSE 93 105* 82 152*  BUN '13 17 11 13  '$ CREATININE 0.83 0.82 0.71 0.80  CALCIUM 9.5 9.3 8.7 8.2*  GFRNONAA >60 >60  --  >60  PROT 6.0* 6.5 6.2  --  ALBUMIN 3.9 4.2 4.1  --   AST '16 17 13  '$ --   ALT '21 27 21  '$ --   ALKPHOS 103 111 97  --   BILITOT 0.4 0.4 0.4  --     Studies: I have reviewed multiple imaging studies with the patient MR CERVICAL SPINE W WO CONTRAST  Addendum Date: 05/28/2022   ADDENDUM REPORT: 05/28/2022 16:46 ADDENDUM: Critical Value/emergent results were called by telephone at the time of interpretation on 05/28/2022 at 4:40 pm to  provider on-call Heath Lark, who verbally acknowledged these results. Electronically Signed   By: Richardean Sale M.D.   On: 05/28/2022 16:46   Result Date: 05/28/2022 CLINICAL DATA:  Multiple myeloma. Increasing neck pain/tenderness with limited range of motion. EXAM: MRI CERVICAL SPINE WITHOUT AND WITH CONTRAST TECHNIQUE: Multiplanar and multiecho pulse sequences of the cervical spine, to include the craniocervical junction and cervicothoracic junction, were obtained without and with intravenous contrast. CONTRAST:  71m GADAVIST GADOBUTROL 1 MMOL/ML IV SOLN COMPARISON:  Radiographs of the cervical and thoracic spine 05/13/2022. Chest CTA 03/15/2022. Cervical MRI 03/06/2020. FINDINGS: Alignment: The alignment of the cervical spine is anatomic, although progressive kyphosis has developed at T2 secondary to a pathologic fracture. Vertebrae: As demonstrated on previous chest CTA, there are lytic lesions in the thoracic spine with associated enhancement following contrast. A large lesion at T2 has progressed and is associated with a new marked pathologic fracture with osseous retropulsion by 6 mm. Resulting cord compression. There is near complete replacement of the T2 vertebral body by tumor, measuring up to 4.8 x 4.0 cm transverse on axial image 47/12. There are additional smaller myelomatous lesions within the T1 vertebral body, within the right C7 and right T3 articulating facets. No other pathologic fractures are identified. Cord: The cervical cord is normal in signal and caliber. There is posterior displacement and mild compression of the thoracic cord at T2 by the pathologic fracture described above. Posterior Fossa, vertebral arteries, paraspinal tissues: Linear enhancement within the right cerebellar hemisphere is likely due to an incidental venous angioma. No associated significant linear T2 hyperintensity. The visualized posterior fossa is otherwise unremarkable.Bilateral vertebral artery flow voids. No  significant paraspinal findings. Disc levels: C2-3: Bilateral facet hypertrophy contributes to chronic left foraminal narrowing. No cord deformity. C3-4: The disc appears normal. The left facet joint appears ankylosed. Chronic moderate left foraminal narrowing. C4-5: Chronic disc bulging with asymmetric uncinate spurring on the right and bilateral facet hypertrophy. Stable mild spinal stenosis without cord deformity. Moderate right-greater-than-left foraminal narrowing is similar to the previous study. C5-6: Chronic spondylosis with posterior osteophytes covering diffusely bulging disc material. Mild facet hypertrophy. Stable mild spinal stenosis and mild foraminal narrowing bilaterally. C6-7: Chronic spondylosis with posterior osteophytes covering diffusely bulging disc material. Mild bilateral facet hypertrophy. Mild foraminal narrowing bilaterally. C7-T1: Normal interspace. As above, pathologic fracture at T2 with significant osseous retropulsion and resulting cord compression. There is moderate left greater than right foraminal narrowing at the T1-2 and T2-3 disc space levels. IMPRESSION: 1. Interval progression of multiple myeloma in the thoracic spine compared with previous chest CT of 10 weeks ago. There is a new pathologic fracture at T2 with significant osseous retropulsion and resulting cord compression. No abnormal cord signal identified. 2. Additional smaller lesions within the T1 vertebral body and right C7 and T3 articulating facets. No other pathologic fractures identified. 3. Mild multilevel cervical spondylosis with resulting mild spinal stenosis and mild to moderate foraminal narrowing at multiple levels as described, similar to  previous MRI. No cord compression in the cervical spine. Electronically Signed: By: Richardean Sale M.D. On: 05/28/2022 16:11   DG Thoracic Spine 2 View  Result Date: 05/15/2022 CLINICAL DATA:  Neck/upper back pain. EXAM: THORACIC SPINE 2 VIEWS COMPARISON:  Cervical  spine radiographs-earlier same day FINDINGS: Evaluation of the superior aspect of the thoracic spine is degraded secondary to overlying osseous and soft tissue structures. Normal alignment of the thoracic spine. No definite anterolisthesis or retrolisthesis. No significant scoliotic curvature. Thoracic vertebral body heights appear preserved. 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 Limited visualization of the adjacent thorax is normal given obliquity and technique. Regional soft tissues appear normal. 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 Electronically Signed   By: Sandi Mariscal M.D.   On: 05/15/2022 15:43   DG Cervical Spine 2 or 3 views  Result Date: 05/15/2022 CLINICAL DATA:  Neck/upper thoracic spine pain. EXAM: CERVICAL SPINE - 2-3 VIEW COMPARISON:  Thoracic spine radiographs-earlier same day FINDINGS: C1 to the superior endplate of C5 is seen on the provided radiograph with suboptimal visualization of the lower cervical spine as well as the cervicothoracic junction on the provided swimmer's radiograph. Accentuated cervical lordosis. Provided open mouth odontoid radiograph is degraded due to obliquity and overlying osseous structures. Visualized cervical vertebral body heights appear preserved. Prevertebral soft tissues appear normal. Mild DDD of C4-C5 with disc space height loss, endplate irregularity and anteriorly directed disc osteophyte complex at this location. Regional soft tissues appear normal. Limited visualization of the lung apices is normal given obliquity and technique. IMPRESSION: 1. Degraded examination secondary to patient's inability to tolerate standard positioning. 2. Suspected mild DDD of C4-C5. Further evaluation with cervical spine MRI could be performed as indicated. Electronically Signed   By: Sandi Mariscal M.D.   On: 05/15/2022 15:41

## 2022-05-30 DIAGNOSIS — M4850XA Collapsed vertebra, not elsewhere classified, site unspecified, initial encounter for fracture: Secondary | ICD-10-CM | POA: Diagnosis not present

## 2022-05-30 DIAGNOSIS — E559 Vitamin D deficiency, unspecified: Secondary | ICD-10-CM | POA: Insufficient documentation

## 2022-05-30 DIAGNOSIS — I4891 Unspecified atrial fibrillation: Secondary | ICD-10-CM | POA: Diagnosis not present

## 2022-05-30 DIAGNOSIS — G9520 Unspecified cord compression: Secondary | ICD-10-CM | POA: Diagnosis not present

## 2022-05-30 DIAGNOSIS — C9000 Multiple myeloma not having achieved remission: Secondary | ICD-10-CM | POA: Diagnosis not present

## 2022-05-30 LAB — BASIC METABOLIC PANEL
Anion gap: 10 (ref 5–15)
BUN: 22 mg/dL (ref 8–23)
CO2: 23 mmol/L (ref 22–32)
Calcium: 8.3 mg/dL — ABNORMAL LOW (ref 8.9–10.3)
Chloride: 104 mmol/L (ref 98–111)
Creatinine, Ser: 0.78 mg/dL (ref 0.61–1.24)
GFR, Estimated: >60 mL/min (ref >60)
Glucose, Bld: 148 mg/dL — ABNORMAL HIGH (ref 70–99)
Potassium: 3.6 mmol/L (ref 3.5–5.1)
Sodium: 137 mmol/L (ref 135–145)

## 2022-05-30 MED ORDER — VITAMIN D 25 MCG (1000 UNIT) PO TABS
2000.0000 [IU] | ORAL_TABLET | Freq: Every day | ORAL | Status: DC
  Administered 2022-05-30 – 2022-06-09 (×9): 2000 [IU] via ORAL
  Filled 2022-05-30 (×9): qty 2

## 2022-05-30 MED ORDER — ENOXAPARIN SODIUM 60 MG/0.6ML IJ SOSY
50.0000 mg | PREFILLED_SYRINGE | INTRAMUSCULAR | Status: DC
  Administered 2022-05-30 – 2022-05-31 (×2): 50 mg via SUBCUTANEOUS
  Filled 2022-05-30 (×2): qty 0.6

## 2022-05-30 NOTE — Progress Notes (Signed)
ANTICOAGULATION CONSULT NOTE - Initial Consult  Pharmacy Consult for Enoxaparin Indication: VTE prophylaxis  Allergies  Allergen Reactions   Linezolid Other (See Comments)    Burning tongue    Vancomycin Rash    Patient Measurements: Height: '5\' 10"'$  (177.8 cm) Weight: 110.9 kg (244 lb 8 oz) IBW/kg (Calculated) : 73   Labs: Recent Labs    05/29/22 0444 05/30/22 0422  HGB 13.9  --   HCT 40.8  --   PLT 181  --   CREATININE 0.80 0.78    Estimated Creatinine Clearance: 105.7 mL/min (by C-G formula based on SCr of 0.78 mg/dL).   Medical History: Past Medical History:  Diagnosis Date   Arthritis    "comes and goes; mostly in hands, occasionally elbow" (11/01/2195)   Complication of anesthesia    "just wanted to sleep alot  for couple days S/P VARICOCELE OR" (04/05/2017)   DVT (deep venous thrombosis) (HCC)    LLE   Dysrhythmia    PVCs    GERD (gastroesophageal reflux disease)    History of hiatal hernia    Hypertension    Impaired glucose tolerance    Multiple myeloma (HCC)    Obesity (BMI 30-39.9) 07/26/2016   OSA on CPAP 07/26/2016   Mild with AHI 12.5/hr now on CPAP at 14cm H2O   PAF (paroxysmal atrial fibrillation) ([facility])    s/p PVI Ablation in 2018 // Flecainide Rx // Echo 8/21: EF 60-65, no RWMA, mild LVH, normal RV SF, moderate RVE, mild BAE, trivial MR, mild dilation of aortic root (40 mm)   Pneumonia    "may have had walking pneumonia a few years ago" (04/05/2017)   Pre-diabetes    Thrombophlebitis     Assessment: 1 yoM admitted on 1/26 with pathologic compression fracture of T2 with risk of cord compression.  Initially resumed on his home apixaban for history of DVT and Afib.  Oncology now recommends to transition him to prophylactic Lovenox only without bridging in anticipation for surgery next week. Last apixaban dose on 1/28 at 10am SCr 0.78, CrCl > 30 ml/min CBC: WNL  Goal of Therapy:  Monitor platelets by anticoagulation protocol: Yes   Plan:   Apixaban is already d/c Lovenox 0.5 mg/kg ('50mg'$ ) SQ q24h  Pharmacy will sign off. Please re--consult for further needs.     Gretta Arab PharmD, BCPS [facility] main pharmacy [phone] 05/30/2022 1:20 PM

## 2022-05-30 NOTE — Progress Notes (Signed)
Travis [name]   DOB:28-Oct-1950   TG#:[id number]    ASSESSMENT & PLAN:  Multiple myeloma I reviewed numerous imaging studies with the patient The patient has CT angiogram of his chest in November for evaluation of atypical chest pain.  At that time, I could see lesion in the thoracic spine that was not mentioned in the report.  The same lesion is now causing risk of cord compression, noted on MRI. I have reviewed MRI thoracic spine and lumbar spine with the patient His case was discussed extensively with neurosurgeon and radiation oncologist He received 1 dose of radiation therapy last evening The plan will be for him to get surgery mid next week I recommend round-the-clock high-dose dexamethasone as scheduled   In terms of his treatment, the light chain studies fluctuate up and down a little bit but not necessarily mean that he progress on combination of daratumumab and Revlimid I will hold Revlimid while he is hospitalized for now I will defer treatment plan to Dr. Lorenso Courier next week   Back pain secondary to bone disease Imminent risk of cord compression We discussed the rationale of admission and round-the-clock dexamethasone I reviewed multiple imaging studies with the patient He tolerated radiation therapy well and high-dose dexamethasone We discussed importance of high-dose vitamin D replacement and he is in agreement  Remote history of DVT, chronic anticoagulation therapy due to history of atrial fibrillation In anticipation for surgery next week, I recommend transition him to prophylactic Lovenox only without bridging tomorrow  Other constipation I recommend scheduled laxatives, improving   Discharge planning He is admitted due to imminent risk of neurological compromise from cord compression Due to risk of neurological compromise, he needs to be in the hospital until after his surgery  All questions were answered. The patient knows to call the clinic with any problems,  questions or concerns.   The total time spent in the appointment was 60 minutes encounter with patients including review of chart and various tests results, discussions about plan of care and coordination of care plan  Heath Lark, MD 05/30/2022 10:36 AM  Subjective:  He tolerated radiation therapy well.  He denies pain.  He slept well No new neurological deficits.  Constipation is resolving with laxatives We spent a lot of time reviewing test results and discussed plan of care  Objective:  Vitals:   05/29/22 2051 05/30/22 0442  BP: (!) 174/81 (!) 140/70  Pulse: 71 64  Resp: 18 20  Temp: 97.8 F (36.6 C) 98 F (36.7 C)  SpO2: 99% 97%     Intake/Output Summary (Last 24 hours) at 05/30/2022 1036 Last data filed at 05/30/2022 0858 Gross per 24 hour  Intake 340 ml  Output --  Net 340 ml    GENERAL:alert, no distress and comfortable  NEURO: alert & oriented x 3 with fluent speech, no focal motor/sensory deficits   Labs:  Recent Labs    05/04/22 0944 05/11/22 1250 05/27/22 1200 05/29/22 0444 05/30/22 0422  NA 142 139 140 136 137  K 3.6 3.3* 3.7 3.9 3.6  CL 104 103 102 103 104  CO2 34* '30 30 23 23  '$ GLUCOSE 93 105* 82 152* 148*  BUN '13 17 11 13 22  '$ CREATININE 0.83 0.82 0.71 0.80 0.78  CALCIUM 9.5 9.3 8.7 8.2* 8.3*  GFRNONAA >60 >60  --  >60 >60  PROT 6.0* 6.5 6.2  --   --   ALBUMIN 3.9 4.2 4.1  --   --   AST  $'16 17 13  'q$ --   --   ALT '21 27 21  '$ --   --   ALKPHOS 103 111 97  --   --   BILITOT 0.4 0.4 0.4  --   --     Studies: I have reviewed multiple imaging studies with the patient MR LUMBAR SPINE W WO CONTRAST  Result Date: 05/29/2022 CLINICAL DATA:  72 year old male with multiple myeloma. 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 EXAM: MRI LUMBAR SPINE WITHOUT AND WITH CONTRAST TECHNIQUE: Multiplanar and multiecho pulse sequences of the lumbar spine were obtained without and with  intravenous contrast. CONTRAST:  39m GADAVIST GADOBUTROL 1 MMOL/ML IV SOLN thoracic in conjunction with contrast enhanced imaging of the chest, abdomen, and pelvis reported separately. COMPARISON:  Thoracic MRI today reported separately. Prior lumbar MRI 06/25/2019. FINDINGS: Segmentation: Normal, concordant with the thoracic numbering today and the same designation used in 2021. Alignment:  Stable lumbar vertebral height and alignment since 2021. Vertebrae: Confluent up to 2.3 cm tumor in the left L1 posterior elements including the pedicle (series 11, image 7). Small 8 mm tumor in the inferior L2 body. And larger 2 cm tumor in the left L2 pedicle. Subcentimeter tumor inferior L3 vertebral body. Larger 10 mm tumor posterior L4 vertebral body. L5 small 9 mm and smaller tumor in the right L5 body. Scattered subcentimeter left sacral ala tumor. These lesions are new since 2021. No lumbar or visible sacral pathologic fracture. No extraosseous or epidural tumor identified. Conus medullaris and cauda equina: Conus extends to the L1 level. No lower spinal cord or conus signal abnormality. No abnormal intradural enhancement. No dural thickening identified. Paraspinal and other soft tissues: Negative for age visible abdominal viscera. Negative lumbar paraspinal soft tissues. Disc levels: Lumbar spine degeneration not significantly changed since 2021 Aside from new moderate size central disc protrusion at L3-L4 (series 11, image 22) now resulting in mild to moderate spinal stenosis at that level. IMPRESSION: 1. Generally small Multiple Myeloma lesions scattered at all lumbar levels and in the visible left sacral ala. Largest lumbar tumor is 2.3 cm in the left L1 posterior elements and pedicle. No lumbar extraosseous or epidural tumor extension, or pathologic fracture. 2. Underlying chronic lumbar spine degeneration, with a new central disc herniation at L3-L4 since 2021 now resulting in mild to moderate degenerative spinal  stenosis there. Electronically Signed   By: HGenevie AnnM.D.   On: 05/29/2022 12:22   MR THORACIC SPINE W WO CONTRAST  Result Date: 05/29/2022 CLINICAL DATA:  72year old male with multiple myeloma. 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 EXAM: MRI THORACIC WITHOUT AND WITH CONTRAST TECHNIQUE: Multiplanar and multiecho pulse sequences of the thoracic spine were obtained without and with intravenous contrast. CONTRAST:  136mGADAVIST GADOBUTROL 1 MMOL/ML IV SOLN COMPARISON:  Cervical spine MRI without and with contrast 05/27/2022. FINDINGS: Limited cervical spine imaging: Stable compared to the recent 05/27/2022 exam. Thoracic spine segmentation:  Appears to be normal. Alignment: Exaggerated upper thoracic kyphosis and part related to the T2 pathologic lesion. Vertebrae: T1 better demonstrated on the recent cervical comparison. Highly abnormal T2 level with retropulsed and epidural tumor. Subsequent cord compression which does not appear significantly changed since 05/27/2022 (series 22, image 9, series 23, images 7 and 8). No definite cord edema or myelomalacia. Bulky ventral and left lateral extraosseous tumor extension as previously noted. Tumor in  the T2 lamina and spinous process also. See series 26, image 9). Mild right T3 posteroinferior vertebral body tumor tracking toward the pedicle. No pathologic fracture. No tumor identified at T4, T5. However, there is posterior right 5th rib tumor on series 26, image 26. Similar small 5 mm enhancing tumor in the left T6 inferior body, endplate. No tumor identified at T7. However, there is posterior right 7th rib tumor (series 27, image 5). T8 12 mm tumor in the anterior body on the right side. No pathologic fracture. No T9 tumor. T10 inferior endplate 12 mm tumor with subtle associated inferior endplate compression. T11 bulky 18 mm midline lamina and spinous process tumor, but no extraosseous  extension. No T12 tumor. Lumbar spine detailed separately. Cord: Normal spinal canal patency and thoracic cord below the T2-T3 disc level. Cord signal and morphology remains normal to the conus at L1. No abnormal intradural enhancement. No definite dural thickening. Paraspinal and other soft tissues: T2 paraspinal tumor eccentric to the left as above. Small layering pleural effusions. Negative visible other chest and upper abdominal viscera. Disc levels: Mild for age underlying thoracic spine degeneration. No degenerative spinal stenosis. IMPRESSION: 1. 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 No change compared to 05/27/2022. No cord edema or myelomalacia identified. 2. Small tumor foci at most other thoracic levels, with no other thoracic extraosseous or epidural tumor extension. Posterior rib involvement intermittently noted. 3. Small layering pleural effusions. 4. Lumbar MRI today is reported separately. Electronically Signed   By: Genevie Ann M.D.   On: 05/29/2022 12:11   MR CERVICAL SPINE W WO CONTRAST  Addendum Date: 05/28/2022   ADDENDUM REPORT: 05/28/2022 16:46 ADDENDUM: Critical Value/emergent results were called by telephone at the time of interpretation on 05/28/2022 at 4:40 pm to provider on-call Heath Lark, who verbally acknowledged these results. Electronically Signed   By: Richardean Sale M.D.   On: 05/28/2022 16:46   Result Date: 05/28/2022 CLINICAL DATA:  Multiple myeloma. Increasing neck pain/tenderness with limited range of motion. EXAM: MRI CERVICAL SPINE WITHOUT AND WITH CONTRAST TECHNIQUE: Multiplanar and multiecho pulse sequences of the cervical spine, to include the craniocervical junction and cervicothoracic junction, were obtained without and with intravenous contrast. CONTRAST:  34m GADAVIST GADOBUTROL 1 MMOL/ML IV SOLN COMPARISON:  Radiographs of the cervical and  thoracic spine 05/13/2022. Chest CTA 03/15/2022. Cervical MRI 03/06/2020. FINDINGS: Alignment: The alignment of the cervical spine is anatomic, although progressive kyphosis has developed at T2 secondary to a pathologic fracture. Vertebrae: As demonstrated on previous chest CTA, there are lytic lesions in the thoracic spine with associated enhancement following contrast. A large lesion at T2 has progressed and is associated with a new marked pathologic fracture with osseous retropulsion by 6 mm. Resulting cord compression. There is near complete replacement of the T2 vertebral body by tumor, measuring up to 4.8 x 4.0 cm transverse on axial image 47/12. There are additional smaller myelomatous lesions within the T1 vertebral body, within the right C7 and right T3 articulating facets. No other pathologic fractures are identified. Cord: The cervical cord is normal in signal and caliber. There is posterior displacement and mild compression of the thoracic cord at T2 by the pathologic fracture described above. Posterior Fossa, vertebral arteries, paraspinal tissues: Linear enhancement within the right cerebellar hemisphere is likely due to an incidental venous angioma. No associated significant linear T2 hyperintensity. The visualized posterior fossa is otherwise unremarkable.Bilateral vertebral artery flow  voids. No significant paraspinal findings. Disc levels: C2-3: Bilateral facet hypertrophy contributes to chronic left foraminal narrowing. No cord deformity. C3-4: The disc appears normal. The left facet joint appears ankylosed. Chronic moderate left foraminal narrowing. C4-5: Chronic disc bulging with asymmetric uncinate spurring on the right and bilateral facet hypertrophy. Stable mild spinal stenosis without cord deformity. Moderate right-greater-than-left foraminal narrowing is similar to the previous study. C5-6: Chronic spondylosis with posterior osteophytes covering diffusely bulging disc material. Mild facet  hypertrophy. Stable mild spinal stenosis and mild foraminal narrowing bilaterally. C6-7: Chronic spondylosis with posterior osteophytes covering diffusely bulging disc material. Mild bilateral facet hypertrophy. Mild foraminal narrowing bilaterally. C7-T1: Normal interspace. As above, pathologic fracture at T2 with significant osseous retropulsion and resulting cord compression. There is moderate left greater than right foraminal narrowing at the T1-2 and T2-3 disc space levels. IMPRESSION: 1. Interval progression of multiple myeloma in the thoracic spine compared with previous chest CT of 10 weeks ago. There is a new pathologic fracture at T2 with significant osseous retropulsion and resulting cord compression. No abnormal cord signal identified. 2. Additional smaller lesions within the T1 vertebral body and right C7 and T3 articulating facets. No other pathologic fractures identified. 3. Mild multilevel cervical spondylosis with resulting mild spinal stenosis and mild to moderate foraminal narrowing at multiple levels as described, similar to previous MRI. No cord compression in the cervical spine. Electronically Signed: By: Richardean Sale M.D. On: 05/28/2022 16:11   DG Thoracic Spine 2 View  Result Date: 05/15/2022 CLINICAL DATA:  Neck/upper back pain. EXAM: THORACIC SPINE 2 VIEWS COMPARISON:  Cervical spine radiographs-earlier same day FINDINGS: Evaluation of the superior aspect of the thoracic spine is degraded secondary to overlying osseous and soft tissue structures. Normal alignment of the thoracic spine. No definite anterolisthesis or retrolisthesis. No significant scoliotic curvature. Thoracic vertebral body heights appear preserved. 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 Limited visualization of the adjacent thorax is normal given obliquity and technique. Regional soft tissues appear normal. 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 Electronically Signed   By: Sandi Mariscal  M.D.   On: 05/15/2022 15:43   DG Cervical Spine 2 or 3 views  Result Date: 05/15/2022 CLINICAL DATA:  Neck/upper thoracic spine pain. EXAM: CERVICAL SPINE - 2-3 VIEW COMPARISON:  Thoracic spine radiographs-earlier same day FINDINGS: C1 to the superior endplate of C5 is seen on the provided radiograph with suboptimal visualization of the lower cervical spine as well as the cervicothoracic junction on the provided swimmer's radiograph. Accentuated cervical lordosis. Provided open mouth odontoid radiograph is degraded due to obliquity and overlying osseous structures. Visualized cervical vertebral body heights appear preserved. Prevertebral soft tissues appear normal. Mild DDD of C4-C5 with disc space height loss, endplate irregularity and anteriorly directed disc osteophyte complex at this location. Regional soft tissues appear normal. Limited visualization of the lung apices is normal given obliquity and technique. IMPRESSION: 1. Degraded examination secondary to patient's inability to tolerate standard positioning. 2. Suspected mild DDD of C4-C5. Further evaluation with cervical spine MRI could be performed as indicated. Electronically Signed   By: Sandi Mariscal M.D.   On: 05/15/2022 15:41

## 2022-05-30 NOTE — Progress Notes (Signed)
[facility]                                                                              Haik Mahoney, is a 72 y.o. male, DOB - 26-Jan-1951, IPJ:[id number] Admit date - 05/28/2022    Outpatient Primary MD for the patient is [name], MD  LOS - 1  days  Chief Complaint  Patient presents with   Back Pain       Brief summary   Patient is a 72 year old male with OSA on CPAP, HTN, paroxysmal A-fib on chronic anticoagulation, history of DVT and multiple myeloma.  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  Since last month, he had aching pain in his upper back that has progressively gotten worse in the last few weeks.  PT did not help much.   He was evaluated by sports medicine and had an MRI of the C-spine which showed interval progression of multiple myeloma in the thoracic spine compared with previous CT chest 10 weeks prior and new pathology go fracture at T2 with significant osseous retropulsion and resulting cord compression.  Additional smaller lesions within the T1 vertebral body and right C7 and T3 articulating facets. He was called by his oncologist, Dr. Alvy Bimler to be hospitalized for emergent radiation therapy.  Currently on chemotherapy every 3 weeks.    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  No cord edema or myelomalacia identified.  Small tumor foci at most of the thoracic levels with no thoracic extraosseous or epidural tumor extension.  Posterior rib involvement intermittently noted.  Small layering pleural effusions -MRI L-spine showed  small multiple myeloma lesions scattered at all lumbar levels and in visible left sacral alla, 2.3 cm and left L1 posterior elements and pedicle.  Underlying chronic lumbar spine degeneration with a new central disc herniation at L3-L4 since 2021 now resulting in mild to moderate degenerative spinal stenosis. -Continue scheduled dexamethasone, radiation oncology, oncology, neurosurgery following -Underwent XRT x 1 on 1/27 -Plan for spinal stabilization with tumor resection next week by neurosurgery -Continue pain control and bowel regimen  Active Problems:  Multiple myeloma ([place]) -Appreciate oncology recommendations, recommended to hold Revlimid while hospitalized -Further treatment plan per Dr. Lorenso Courier -Continue high-dose vitamin D replacement    Hypertension -BP stable, continue lisinopril, Norvasc     Atrial fibrillation ([facility]) -Heart rate controlled, continue amiodarone -Hold Eliquis    OSA (obstructive sleep apnea) - CPAP QHS    History of DVT (deep vein thrombosis) -Hold Eliquis per oncology recommendations -Will place on Lovenox for DVT prophylaxis    Other constipation -Placed on scheduled laxatives, MiraLAX, Senokot-S  Obesity Estimated body mass index is 35.08 kg/m as calculated from the following:   Height as of this encounter: '5\' 10"'$  (1.778 m).  Weight as of this encounter: 110.9 kg.  Code Status: Full code DVT Prophylaxis:  SCDs Start: 05/29/22 0348 apixaban (ELIQUIS) tablet 5 mg   Level of Care: Level of care: Progressive Family Communication: Updated patient Disposition Plan:      Remains inpatient appropriate: High risk of neurological compromise from T2 vertebral fracture and cord compression   Procedures:  MRI T-spine, L-spine  Consultants:   Oncology  Antimicrobials:   Anti-infectives (From admission, onward)    Start     Dose/Rate Route Frequency Ordered Stop   05/28/22 2345  acyclovir (ZOVIRAX) tablet 400 mg        400 mg Oral 2 times daily  05/28/22 2333            Medications  acyclovir  400 mg Oral BID   amiodarone  200 mg Oral Daily   amLODipine  10 mg Oral Daily   apixaban  5 mg Oral BID   cholecalciferol  2,000 Units Oral Daily   dexamethasone (DECADRON) injection  4 mg Intravenous Y4I   folic acid  1 mg Oral Daily   gabapentin  600 mg Oral BID   hydrALAZINE  37.5 mg Oral TID   lisinopril  40 mg Oral Daily   polyethylene glycol  17 g Oral Daily   senna-docusate  2 tablet Oral BID      Subjective:   Rhyder Koegel was seen and examined today.  Ambulating in the room, feels no significant back and right shoulder pain when he is lying down and not moving.   Patient denies dizziness, chest pain, shortness of breath, abdominal pain, N/V.  Currently no focal weakness.  Objective:   Vitals:   05/29/22 1845 05/29/22 2051 05/30/22 0442 05/30/22 1228  BP: 137/63 (!) 174/81 (!) 140/70 135/65  Pulse: 73 71 64 69  Resp: (!) '22 18 20 18  '$ Temp:  97.8 F (36.6 C) 98 F (36.7 C) (!) 97.5 F (36.4 C)  TempSrc:  Oral Oral   SpO2:  99% 97% 100%  Weight:      Height:        Intake/Output Summary (Last 24 hours) at 05/30/2022 1310 Last data filed at 05/30/2022 0858 Gross per 24 hour  Intake 340 ml  Output --  Net 340 ml     Wt Readings from Last 3 Encounters:  05/29/22 110.9 kg  05/18/22 111.8 kg  05/13/22 112.9 kg   Physical Exam General: Alert and oriented x 3, NAD Cardiovascular: S1 S2 clear, RRR.  Respiratory: CTAB, no wheezing, rales or rhonchi Gastrointestinal: Soft, nontender, nondistended, NBS Ext: no pedal edema bilaterally Neuro: no new deficits Skin: No rashes Psych: Normal affect and demeanor   Data Reviewed:  I have personally reviewed following labs    CBC Lab Results  Component Value Date   WBC 6.5 05/29/2022   RBC 4.22 05/29/2022   HGB 13.9 05/29/2022   HCT 40.8 05/29/2022   MCV 96.7 05/29/2022   MCH 32.9 05/29/2022   PLT 181 05/29/2022   MCHC 34.1 05/29/2022   RDW 14.8  05/29/2022   LYMPHSABS 0.1 (L) 05/29/2022   MONOABS 0.2 05/29/2022   EOSABS 0.0 05/29/2022   BASOSABS 0.0 34/74/2595     Last metabolic panel Lab Results  Component Value Date   NA 137 05/30/2022   K 3.6 05/30/2022   CL 104 05/30/2022   CO2 23 05/30/2022   BUN 22 05/30/2022   CREATININE 0.78 05/30/2022   GLUCOSE 148 (H) 05/30/2022   GFRNONAA >60  05/30/2022   GFRAA 108 12/04/2019   CALCIUM 8.3 (L) 05/30/2022   PROT 6.2 05/27/2022   ALBUMIN 4.1 05/27/2022   LABGLOB 2.0 (L) 05/11/2022   AGRATIO 2.4 (H) 04/23/2022   BILITOT 0.4 05/27/2022   ALKPHOS 97 05/27/2022   AST 13 05/27/2022   ALT 21 05/27/2022   ANIONGAP 10 05/30/2022    CBG (last 3)  No results for input(s): "GLUCAP" in the last 72 hours.    Coagulation Profile: No results for input(s): "INR", "PROTIME" in the last 168 hours.   Radiology Studies: I have personally reviewed the imaging studies  MR LUMBAR SPINE W WO CONTRAST  Result Date: 05/29/2022 CLINICAL DATA:  72 year old male with multiple myeloma. 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 EXAM: MRI LUMBAR SPINE WITHOUT AND WITH CONTRAST TECHNIQUE: Multiplanar and multiecho pulse sequences of the lumbar spine were obtained without and with intravenous contrast. CONTRAST:  53m GADAVIST GADOBUTROL 1 MMOL/ML IV SOLN thoracic in conjunction with contrast enhanced imaging of the chest, abdomen, and pelvis reported separately. COMPARISON:  Thoracic MRI today reported separately. Prior lumbar MRI 06/25/2019. FINDINGS: Segmentation: Normal, concordant with the thoracic numbering today and the same designation used in 2021. Alignment:  Stable lumbar vertebral height and alignment since 2021. Vertebrae: Confluent up to 2.3 cm tumor in the left L1 posterior elements including the pedicle (series 11, image 7). Small 8 mm tumor in the inferior L2 body. And larger 2 cm tumor in the left L2 pedicle.  Subcentimeter tumor inferior L3 vertebral body. Larger 10 mm tumor posterior L4 vertebral body. L5 small 9 mm and smaller tumor in the right L5 body. Scattered subcentimeter left sacral ala tumor. These lesions are new since 2021. No lumbar or visible sacral pathologic fracture. No extraosseous or epidural tumor identified. Conus medullaris and cauda equina: Conus extends to the L1 level. No lower spinal cord or conus signal abnormality. No abnormal intradural enhancement. No dural thickening identified. Paraspinal and other soft tissues: Negative for age visible abdominal viscera. Negative lumbar paraspinal soft tissues. Disc levels: Lumbar spine degeneration not significantly changed since 2021 Aside from new moderate size central disc protrusion at L3-L4 (series 11, image 22) now resulting in mild to moderate spinal stenosis at that level. IMPRESSION: 1. Generally small Multiple Myeloma lesions scattered at all lumbar levels and in the visible left sacral ala. Largest lumbar tumor is 2.3 cm in the left L1 posterior elements and pedicle. No lumbar extraosseous or epidural tumor extension, or pathologic fracture. 2. 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 Electronically Signed   By: HGenevie AnnM.D.   On: 05/29/2022 12:22   MR THORACIC SPINE W WO CONTRAST  Result Date: 05/29/2022 CLINICAL DATA:  72year old male with multiple myeloma. 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 EXAM: MRI THORACIC WITHOUT AND WITH CONTRAST TECHNIQUE: Multiplanar and multiecho pulse sequences of the thoracic spine were obtained without and with intravenous contrast. CONTRAST:  191mGADAVIST GADOBUTROL 1 MMOL/ML IV SOLN COMPARISON:  Cervical spine MRI without and with contrast 05/27/2022. FINDINGS: Limited cervical spine imaging:  Stable compared to the recent 05/27/2022 exam. Thoracic spine segmentation:  Appears to be normal. Alignment: Exaggerated upper thoracic kyphosis and part related to the T2 pathologic lesion. Vertebrae: T1 better demonstrated on the recent cervical comparison.  Highly abnormal T2 level with retropulsed and epidural tumor. Subsequent cord compression which does not appear significantly changed since 05/27/2022 (series 22, image 9, series 23, images 7 and 8). No definite cord edema or myelomalacia. Bulky ventral and left lateral extraosseous tumor extension as previously noted. Tumor in the T2 lamina and spinous process also. See series 26, image 9). Mild right T3 posteroinferior vertebral body tumor tracking toward the pedicle. No pathologic fracture. No tumor identified at T4, T5. However, there is posterior right 5th rib tumor on series 26, image 26. Similar small 5 mm enhancing tumor in the left T6 inferior body, endplate. No tumor identified at T7. However, there is posterior right 7th rib tumor (series 27, image 5). T8 12 mm tumor in the anterior body on the right side. No pathologic fracture. No T9 tumor. T10 inferior endplate 12 mm tumor with subtle associated inferior endplate compression. T11 bulky 18 mm midline lamina and spinous process tumor, but no extraosseous extension. No T12 tumor. Lumbar spine detailed separately. Cord: Normal spinal canal patency and thoracic cord below the T2-T3 disc level. Cord signal and morphology remains normal to the conus at L1. No abnormal intradural enhancement. No definite dural thickening. Paraspinal and other soft tissues: T2 paraspinal tumor eccentric to the left as above. Small layering pleural effusions. Negative visible other chest and upper abdominal viscera. Disc levels: Mild for age underlying thoracic spine degeneration. No degenerative spinal stenosis. IMPRESSION: 1. Redemonstrated confluent plasmacytoma of the T2 vertebral body with pathologic collapse,  retropulsed and epidural tumor, and subsequent spinal cord compression. No change compared to 05/27/2022. No cord edema or myelomalacia identified. 2. Small tumor foci at most other thoracic levels, with no other thoracic extraosseous or epidural tumor extension. Posterior rib involvement intermittently noted. 3. Small layering pleural effusions. 4. Lumbar MRI today is reported separately. Electronically Signed   By: Genevie Ann M.D.   On: 05/29/2022 12:11       Detric Scalisi M.D. [facility] 05/30/2022, 1:10 PM  Available via Epic secure chat 7am-7pm After 7 pm, please refer to night coverage provider listed on amion.

## 2022-05-31 ENCOUNTER — Ambulatory Visit: Payer: Medicare Other | Admitting: Physical Therapy

## 2022-05-31 ENCOUNTER — Other Ambulatory Visit: Payer: Self-pay | Admitting: Radiation Therapy

## 2022-05-31 ENCOUNTER — Telehealth: Payer: Self-pay | Admitting: Radiation Oncology

## 2022-05-31 ENCOUNTER — Other Ambulatory Visit: Payer: Self-pay | Admitting: Neurosurgery

## 2022-05-31 DIAGNOSIS — C9000 Multiple myeloma not having achieved remission: Secondary | ICD-10-CM | POA: Diagnosis not present

## 2022-05-31 DIAGNOSIS — K5909 Other constipation: Secondary | ICD-10-CM | POA: Diagnosis not present

## 2022-05-31 DIAGNOSIS — E559 Vitamin D deficiency, unspecified: Secondary | ICD-10-CM | POA: Diagnosis not present

## 2022-05-31 DIAGNOSIS — M4850XA Collapsed vertebra, not elsewhere classified, site unspecified, initial encounter for fracture: Secondary | ICD-10-CM | POA: Diagnosis not present

## 2022-05-31 LAB — BASIC METABOLIC PANEL
Anion gap: 9 (ref 5–15)
BUN: 22 mg/dL (ref 8–23)
CO2: 24 mmol/L (ref 22–32)
Calcium: 7.9 mg/dL — ABNORMAL LOW (ref 8.9–10.3)
Chloride: 102 mmol/L (ref 98–111)
Creatinine, Ser: 0.86 mg/dL (ref 0.61–1.24)
GFR, Estimated: >60 mL/min (ref >60)
Glucose, Bld: 156 mg/dL — ABNORMAL HIGH (ref 70–99)
Potassium: 3.5 mmol/L (ref 3.5–5.1)
Sodium: 135 mmol/L (ref 135–145)

## 2022-05-31 LAB — CBC
HCT: 39.4 % (ref 39.0–52.0)
Hemoglobin: 13.6 g/dL (ref 13.0–17.0)
MCH: 32.4 pg (ref 26.0–34.0)
MCHC: 34.5 g/dL (ref 30.0–36.0)
MCV: 93.8 fL (ref 80.0–100.0)
Platelets: 176 10*3/uL (ref 150–400)
RBC: 4.2 MIL/uL — ABNORMAL LOW (ref 4.22–5.81)
RDW: 14.7 % (ref 11.5–15.5)
WBC: 10.4 10*3/uL (ref 4.0–10.5)
nRBC: 0 % (ref 0.0–0.2)

## 2022-05-31 NOTE — Progress Notes (Signed)
Travis [name]   DOB:1951-01-17   ML#:[id number]    ASSESSMENT & PLAN:  Multiple myeloma I reviewed numerous imaging studies with the patient The patient has CT angiogram of his chest in November for evaluation of atypical chest pain.  At that time, I could see lesion in the thoracic spine that was not mentioned in the report.  The same lesion is now causing risk of cord compression, noted on MRI. I have reviewed MRI thoracic spine and lumbar spine with the patient His case was discussed extensively with neurosurgeon and radiation oncologist He received 1 dose of radiation therapy on Saturday The plan will be for him to get surgery mid next week I recommend round-the-clock high-dose dexamethasone as scheduled   In terms of his treatment, the light chain studies fluctuate up and down a little bit but not necessarily mean that he progress on combination of daratumumab and Revlimid I will hold Revlimid while he is hospitalized for now I will defer treatment plan to Dr. Lorenso [name]    Back pain secondary to bone disease Imminent risk of cord compression We discussed the rationale of admission and round-the-clock dexamethasone I reviewed multiple imaging studies with the patient He tolerated radiation therapy well and high-dose dexamethasone We discussed importance of high-dose vitamin D replacement and he is in agreement   Remote history of DVT, chronic anticoagulation therapy due to history of atrial fibrillation In anticipation for surgery, I recommend transition him to prophylactic Lovenox only without bridging tomorrow   Other constipation I recommend scheduled laxatives, improving   Discharge planning He is admitted due to imminent risk of neurological compromise from cord compression Due to risk of neurological compromise, he needs to be in the hospital until after his surgery    All questions were answered. The patient knows to call the clinic with any problems, questions or  concerns.   The total time spent in the appointment was 30 minutes encounter with patients including review of chart and various tests results, discussions about plan of care and coordination of care plan  Travis Lark, MD 05/31/2022 8:47 AM  Subjective:  He felt better this morning.  A bit of stiffness.  His chronic severe back pain has improved dramatically over the last 2 days.  Denies constipation.  The patient numerous questions related to prior imaging studies and plan of care  Objective:  Vitals:   05/30/22 2009 05/31/22 0631  BP: 139/70 (!) 142/86  Pulse: 73 69  Resp: 20 18  Temp: 98.4 F (36.9 C) 97.7 F (36.5 C)  SpO2: 96% 97%     Intake/Output Summary (Last 24 hours) at 05/31/2022 0847 Last data filed at 05/30/2022 2200 Gross per 24 hour  Intake 710 ml  Output --  Net 710 ml    GENERAL:alert, no distress and comfortable  NEURO: alert & oriented x 3 with fluent speech, no focal motor/sensory deficits   Labs:  Recent Labs    05/04/22 0944 05/11/22 1250 05/27/22 1200 05/29/22 0444 05/30/22 0422 05/31/22 0340  NA 142 139 140 136 137 135  K 3.6 3.3* 3.7 3.9 3.6 3.5  CL 104 103 102 103 104 102  CO2 34* '30 30 23 23 24  '$ GLUCOSE 93 105* 82 152* 148* 156*  BUN '13 17 11 13 22 22  '$ CREATININE 0.83 0.82 0.71 0.80 0.78 0.86  CALCIUM 9.5 9.3 8.7 8.2* 8.3* 7.9*  GFRNONAA >60 >60  --  >60 >60 >60  PROT 6.0* 6.5 6.2  --   --   --  ALBUMIN 3.9 4.2 4.1  --   --   --   AST '16 17 13  '$ --   --   --   ALT '21 27 21  '$ --   --   --   ALKPHOS 103 111 97  --   --   --   BILITOT 0.4 0.4 0.4  --   --   --     Studies:  [place] LIMITED JOINT SPACE STRUCTURES UP LEFT(NO LINKED CHARGES)  Result Date: 05/30/2022 No images saved   MR LUMBAR SPINE W WO CONTRAST  Result Date: 05/29/2022 CLINICAL DATA:  72 year old male with multiple myeloma. 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 EXAM: MRI LUMBAR SPINE WITHOUT  AND WITH CONTRAST TECHNIQUE: Multiplanar and multiecho pulse sequences of the lumbar spine were obtained without and with intravenous contrast. CONTRAST:  78m GADAVIST GADOBUTROL 1 MMOL/ML IV SOLN thoracic in conjunction with contrast enhanced imaging of the chest, abdomen, and pelvis reported separately. COMPARISON:  Thoracic MRI today reported separately. Prior lumbar MRI 06/25/2019. FINDINGS: Segmentation: Normal, concordant with the thoracic numbering today and the same designation used in 2021. Alignment:  Stable lumbar vertebral height and alignment since 2021. Vertebrae: Confluent up to 2.3 cm tumor in the left L1 posterior elements including the pedicle (series 11, image 7). Small 8 mm tumor in the inferior L2 body. And larger 2 cm tumor in the left L2 pedicle. Subcentimeter tumor inferior L3 vertebral body. Larger 10 mm tumor posterior L4 vertebral body. L5 small 9 mm and smaller tumor in the right L5 body. Scattered subcentimeter left sacral ala tumor. These lesions are new since 2021. No lumbar or visible sacral pathologic fracture. No extraosseous or epidural tumor identified. Conus medullaris and cauda equina: Conus extends to the L1 level. No lower spinal cord or conus signal abnormality. No abnormal intradural enhancement. No dural thickening identified. Paraspinal and other soft tissues: Negative for age visible abdominal viscera. Negative lumbar paraspinal soft tissues. Disc levels: Lumbar spine degeneration not significantly changed since 2021 Aside from new moderate size central disc protrusion at L3-L4 (series 11, image 22) now resulting in mild to moderate spinal stenosis at that level. IMPRESSION: 1. Generally small Multiple Myeloma lesions scattered at all lumbar levels and in the visible left sacral ala. Largest lumbar tumor is 2.3 cm in the left L1 posterior elements and pedicle. No lumbar extraosseous or epidural tumor extension, or pathologic fracture. 2. Underlying chronic lumbar spine  degeneration, with a new central disc herniation at L3-L4 since 2021 now resulting in mild to moderate degenerative spinal stenosis there. Electronically Signed   By: HGenevie AnnM.D.   On: 05/29/2022 12:22   MR THORACIC SPINE W WO CONTRAST  Result Date: 05/29/2022 CLINICAL DATA:  72year old male with multiple myeloma. 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 EXAM: MRI THORACIC WITHOUT AND WITH CONTRAST TECHNIQUE: Multiplanar and multiecho pulse sequences of the thoracic spine were obtained without and with intravenous contrast. CONTRAST:  172mGADAVIST GADOBUTROL 1 MMOL/ML IV SOLN COMPARISON:  Cervical spine MRI without and with contrast 05/27/2022. FINDINGS: Limited cervical spine imaging: Stable compared to the recent 05/27/2022 exam. Thoracic spine segmentation:  Appears to be normal. Alignment: Exaggerated upper thoracic kyphosis and part related to the T2 pathologic lesion. Vertebrae: T1 better demonstrated on the recent cervical comparison. Highly abnormal T2 level with retropulsed and epidural tumor. Subsequent cord compression  which does not appear significantly changed since 05/27/2022 (series 22, image 9, series 23, images 7 and 8). No definite cord edema or myelomalacia. Bulky ventral and left lateral extraosseous tumor extension as previously noted. Tumor in the T2 lamina and spinous process also. See series 26, image 9). Mild right T3 posteroinferior vertebral body tumor tracking toward the pedicle. No pathologic fracture. No tumor identified at T4, T5. However, there is posterior right 5th rib tumor on series 26, image 26. Similar small 5 mm enhancing tumor in the left T6 inferior body, endplate. No tumor identified at T7. However, there is posterior right 7th rib tumor (series 27, image 5). T8 12 mm tumor in the anterior body on the right side. No pathologic fracture. No T9 tumor. T10 inferior endplate 12 mm tumor with  subtle associated inferior endplate compression. T11 bulky 18 mm midline lamina and spinous process tumor, but no extraosseous extension. No T12 tumor. Lumbar spine detailed separately. Cord: Normal spinal canal patency and thoracic cord below the T2-T3 disc level. Cord signal and morphology remains normal to the conus at L1. No abnormal intradural enhancement. No definite dural thickening. Paraspinal and other soft tissues: T2 paraspinal tumor eccentric to the left as above. Small layering pleural effusions. Negative visible other chest and upper abdominal viscera. Disc levels: Mild for age underlying thoracic spine degeneration. No degenerative spinal stenosis. IMPRESSION: 1. 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 No change compared to 05/27/2022. No cord edema or myelomalacia identified. 2. Small tumor foci at most other thoracic levels, with no other thoracic extraosseous or epidural tumor extension. Posterior rib involvement intermittently noted. 3. Small layering pleural effusions. 4. Lumbar MRI today is reported separately. Electronically Signed   By: Genevie Ann M.D.   On: 05/29/2022 12:11   MR CERVICAL SPINE W WO CONTRAST  Addendum Date: 05/28/2022   ADDENDUM REPORT: 05/28/2022 16:46 ADDENDUM: Critical Value/emergent results were called by telephone at the time of interpretation on 05/28/2022 at 4:40 pm to provider on-call Travis [name], who verbally acknowledged these results. Electronically Signed   By: Richardean Sale M.D.   On: 05/28/2022 16:46   Result Date: 05/28/2022 CLINICAL DATA:  Multiple myeloma. Increasing neck pain/tenderness with limited range of motion. EXAM: MRI CERVICAL SPINE WITHOUT AND WITH CONTRAST TECHNIQUE: Multiplanar and multiecho pulse sequences of the cervical spine, to include the craniocervical junction and cervicothoracic junction, were obtained without and  with intravenous contrast. CONTRAST:  79m GADAVIST GADOBUTROL 1 MMOL/ML IV SOLN COMPARISON:  Radiographs of the cervical and thoracic spine 05/13/2022. Chest CTA 03/15/2022. Cervical MRI 03/06/2020. FINDINGS: Alignment: The alignment of the cervical spine is anatomic, although progressive kyphosis has developed at T2 secondary to a pathologic fracture. Vertebrae: As demonstrated on previous chest CTA, there are lytic lesions in the thoracic spine with associated enhancement following contrast. A large lesion at T2 has progressed and is associated with a new marked pathologic fracture with osseous retropulsion by 6 mm. Resulting cord compression. There is near complete replacement of the T2 vertebral body by tumor, measuring up to 4.8 x 4.0 cm transverse on axial image 47/12. There are additional smaller myelomatous lesions within the T1 vertebral body, within the right C7 and right T3 articulating facets. No other pathologic fractures are identified. Cord: The cervical cord is normal in signal and caliber. There is posterior displacement and mild compression of the thoracic cord at T2 by the pathologic fracture described above.  Posterior Fossa, vertebral arteries, paraspinal tissues: Linear enhancement within the right cerebellar hemisphere is likely due to an incidental venous angioma. No associated significant linear T2 hyperintensity. The visualized posterior fossa is otherwise unremarkable.Bilateral vertebral artery flow voids. No significant paraspinal findings. Disc levels: C2-3: Bilateral facet hypertrophy contributes to chronic left foraminal narrowing. No cord deformity. C3-4: The disc appears normal. The left facet joint appears ankylosed. Chronic moderate left foraminal narrowing. C4-5: Chronic disc bulging with asymmetric uncinate spurring on the right and bilateral facet hypertrophy. Stable mild spinal stenosis without cord deformity. Moderate right-greater-than-left foraminal narrowing is similar to  the previous study. C5-6: Chronic spondylosis with posterior osteophytes covering diffusely bulging disc material. Mild facet hypertrophy. Stable mild spinal stenosis and mild foraminal narrowing bilaterally. C6-7: Chronic spondylosis with posterior osteophytes covering diffusely bulging disc material. Mild bilateral facet hypertrophy. Mild foraminal narrowing bilaterally. C7-T1: Normal interspace. As above, pathologic fracture at T2 with significant osseous retropulsion and resulting cord compression. There is moderate left greater than right foraminal narrowing at the T1-2 and T2-3 disc space levels. IMPRESSION: 1. Interval progression of multiple myeloma in the thoracic spine compared with previous chest CT of 10 weeks ago. There is a new pathologic fracture at T2 with significant osseous retropulsion and resulting cord compression. No abnormal cord signal identified. 2. Additional smaller lesions within the T1 vertebral body and right C7 and T3 articulating facets. No other pathologic fractures identified. 3. Mild multilevel cervical spondylosis with resulting mild spinal stenosis and mild to moderate foraminal narrowing at multiple levels as described, similar to previous MRI. No cord compression in the cervical spine. Electronically Signed: By: Richardean Sale M.D. On: 05/28/2022 16:11   DG Thoracic Spine 2 View  Result Date: 05/15/2022 CLINICAL DATA:  Neck/upper back pain. EXAM: THORACIC SPINE 2 VIEWS COMPARISON:  Cervical spine radiographs-earlier same day FINDINGS: Evaluation of the superior aspect of the thoracic spine is degraded secondary to overlying osseous and soft tissue structures. Normal alignment of the thoracic spine. No definite anterolisthesis or retrolisthesis. No significant scoliotic curvature. Thoracic vertebral body heights appear preserved. 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 Limited visualization of the adjacent thorax is normal given obliquity and technique. Regional soft  tissues appear normal. 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 Electronically Signed   By: Sandi Mariscal M.D.   On: 05/15/2022 15:43   DG Cervical Spine 2 or 3 views  Result Date: 05/15/2022 CLINICAL DATA:  Neck/upper thoracic spine pain. EXAM: CERVICAL SPINE - 2-3 VIEW COMPARISON:  Thoracic spine radiographs-earlier same day FINDINGS: C1 to the superior endplate of C5 is seen on the provided radiograph with suboptimal visualization of the lower cervical spine as well as the cervicothoracic junction on the provided swimmer's radiograph. Accentuated cervical lordosis. Provided open mouth odontoid radiograph is degraded due to obliquity and overlying osseous structures. Visualized cervical vertebral body heights appear preserved. Prevertebral soft tissues appear normal. Mild DDD of C4-C5 with disc space height loss, endplate irregularity and anteriorly directed disc osteophyte complex at this location. Regional soft tissues appear normal. Limited visualization of the lung apices is normal given obliquity and technique. IMPRESSION: 1. Degraded examination secondary to patient's inability to tolerate standard positioning. 2. Suspected mild DDD of C4-C5. Further evaluation with cervical spine MRI could be performed as indicated. Electronically Signed   By: Sandi Mariscal M.D.   On: 05/15/2022 15:41

## 2022-05-31 NOTE — Telephone Encounter (Signed)
1/29 @ 11:45 am patient called so Dr. Sondra Come is aware he would remain at [facility] after his surgery on 2/2.  We will updated Inpatient consult once bed is assigned at [facility].

## 2022-05-31 NOTE — Progress Notes (Signed)
[facility]                                                                              Con Arganbright, is a 72 y.o. male, DOB - [birth year], XBM:[id number] Admit date - 05/28/2022    Outpatient Primary MD for the patient is Koleen Nimrod Walker Shadow, MD  LOS - 2  days  Chief Complaint  Patient presents with   Back Pain       Brief summary   Patient is a 72 year old male with OSA on CPAP, HTN, paroxysmal A-fib on chronic anticoagulation, history of DVT and multiple myeloma.  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  Since last month, he had aching pain in his upper back that has progressively gotten worse in the last few weeks.  PT did not help much.   He was evaluated by sports medicine and had an MRI of the C-spine which showed interval progression of multiple myeloma in the thoracic spine compared with previous CT chest 10 weeks prior and new pathology go fracture at T2 with significant osseous retropulsion and resulting cord compression.  Additional smaller lesions within the T1 vertebral body and right C7 and T3 articulating facets. He was called by his oncologist, Dr. Alvy Bimler to be hospitalized for emergent radiation therapy.  Currently on chemotherapy every 3 weeks.    Assessment & Plan    Principal Problem:   Pathologic compression fracture of T2 vertebral body with risk of cord compression, initial encounter ([place]) -MRI thoracic spine today showed confluent plasmacytoma of T2 vertebral body with pathology collapse, retropulsion of epidural tumor and subsequent spinal cord compression.  No cord edema or myelomalacia identified.  Small tumor foci at most of the thoracic levels with no thoracic extraosseous or epidural tumor extension.  Posterior rib involvement intermittently noted.  Small layering pleural effusions -MRI L-spine showed  small multiple myeloma lesions scattered at all lumbar levels and in visible left sacral alla, 2.3 cm and left L1 posterior elements and pedicle.  Underlying chronic lumbar spine degeneration with a new central disc herniation at L3-L4 since 2021 now resulting in mild to moderate degenerative spinal stenosis. -Continue scheduled dexamethasone, Rad Onc, oncology, neurosurgery following -Underwent XRT x 1 on 1/27 -Continue pain control and bowel regimen -Per patient, pain is controlled. -Per neurosurgery, Dr. Christella Noa, plan for spinal stabilization with tumor resection next week (wed or Thursday), will transfer to [facility] tomorrow  Active Problems:  Multiple myeloma ([place]) -Appreciate oncology recommendations, recommended to hold Revlimid while hospitalized -Further treatment plan per Dr. Lorenso Courier, received urgent XRT x1 on 1/27 -Continue high-dose vitamin D replacement    Hypertension -BP stable, continue lisinopril, Norvasc     Atrial fibrillation ([facility]) -Heart rate controlled, continue amiodarone -Holding Eliquis for surgery    OSA (obstructive sleep apnea) - CPAP QHS    History of DVT (deep vein thrombosis) -Hold Eliquis per oncology recommendations -d/w Dr Alvy Bimler, recommended Lovenox for DVT prophylaxis    Other constipation -Placed on scheduled laxatives, MiraLAX, Senokot-S  Obesity  Estimated body mass index is 35.08 kg/m as calculated from the following:   Height as of this encounter: '5\' 10"'$  (1.778 m).   Weight as of this encounter: 110.9 kg.  Code Status: Full code DVT Prophylaxis:  SCDs Start: 05/29/22 0348   Level of Care: Level of care: Progressive Family Communication: Updated patient Disposition Plan:      Remains inpatient appropriate: High risk of neurological compromise from T2 vertebral fracture and cord compression   Procedures:  MRI T-spine, L-spine  Consultants:   Oncology Radiation oncology Neurosurgery, Dr. Christella Noa  Antimicrobials:   Anti-infectives  (From admission, onward)    Start     Dose/Rate Route Frequency Ordered Stop   05/28/22 2345  acyclovir (ZOVIRAX) tablet 400 mg        400 mg Oral 2 times daily 05/28/22 2333            Medications  acyclovir  400 mg Oral BID   amiodarone  200 mg Oral Daily   amLODipine  10 mg Oral Daily   cholecalciferol  2,000 Units Oral Daily   dexamethasone (DECADRON) injection  4 mg Intravenous Q6H   enoxaparin (LOVENOX) injection  50 mg Subcutaneous Q03K   folic acid  1 mg Oral Daily   gabapentin  600 mg Oral BID   hydrALAZINE  37.5 mg Oral TID   lisinopril  40 mg Oral Daily   polyethylene glycol  17 g Oral Daily   senna-docusate  2 tablet Oral BID      Subjective:   Travis [name] was seen and examined today.  Pleasant and in good spirits, pain is currently controlled.  Currently no focal weakness.  Denies any chest pain, shortness of breath, nausea or vomiting.  Tolerating diet.  Objective:   Vitals:   05/30/22 1228 05/30/22 2009 05/31/22 0631 05/31/22 0800  BP: 135/65 139/70 (!) 142/86   Pulse: 69 73 69   Resp: '18 20 18 19  '$ Temp: (!) 97.5 F (36.4 C) 98.4 F (36.9 C) 97.7 F (36.5 C)   TempSrc:  Oral Oral   SpO2: 100% 96% 97%   Weight:      Height:        Intake/Output Summary (Last 24 hours) at 05/31/2022 1153 Last data filed at 05/30/2022 2200 Gross per 24 hour  Intake 490 ml  Output --  Net 490 ml     Wt Readings from Last 3 Encounters:  05/29/22 110.9 kg  05/18/22 111.8 kg  05/13/22 112.9 kg    Physical Exam General: Alert and oriented x 3, NAD Cardiovascular: S1 S2 clear, RRR.  Respiratory: CTAB Gastrointestinal: Soft, nontender, nondistended, NBS Ext: no pedal edema bilaterally Neuro: no new deficits Psych: Normal affect, pleasant   Data Reviewed:  I have personally reviewed following labs    CBC Lab Results  Component Value Date   WBC 10.4 05/31/2022   RBC 4.20 (L) 05/31/2022   HGB 13.6 05/31/2022   HCT 39.4 05/31/2022   MCV 93.8  05/31/2022   MCH 32.4 05/31/2022   PLT 176 05/31/2022   MCHC 34.5 05/31/2022   RDW 14.7 05/31/2022   LYMPHSABS 0.1 (L) 05/29/2022   MONOABS 0.2 05/29/2022   EOSABS 0.0 05/29/2022   BASOSABS 0.0 74/25/9563     Last metabolic panel Lab Results  Component Value Date   NA 135 05/31/2022   K 3.5 05/31/2022   CL 102 05/31/2022   CO2 24 05/31/2022   BUN 22 05/31/2022   CREATININE 0.86 05/31/2022  GLUCOSE 156 (H) 05/31/2022   GFRNONAA >60 05/31/2022   GFRAA 108 12/04/2019   CALCIUM 7.9 (L) 05/31/2022   PROT 6.2 05/27/2022   ALBUMIN 4.1 05/27/2022   LABGLOB 2.0 (L) 05/11/2022   AGRATIO 2.4 (H) 04/23/2022   BILITOT 0.4 05/27/2022   ALKPHOS 97 05/27/2022   AST 13 05/27/2022   ALT 21 05/27/2022   ANIONGAP 9 05/31/2022    CBG (last 3)  No results for input(s): "GLUCAP" in the last 72 hours.    Coagulation Profile: No results for input(s): "INR", "PROTIME" in the last 168 hours.   Radiology Studies: I have personally reviewed the imaging studies  No results found.     Estill Cotta M.D. [facility] 05/31/2022, 11:53 AM  Available via Epic secure chat 7am-7pm After 7 pm, please refer to night coverage provider listed on amion.

## 2022-05-31 NOTE — Progress Notes (Signed)
Patient ID: Travis [name], male   DOB: 1950-05-23, 72 y.o.   MRN: [id number] BP 130/64 (BP Location: Left Arm)   Pulse 73   Temp 97.8 F (36.6 C)   Resp 18   Ht '5\' 10"'$  (1.778 m)   Wt 110.9 kg   SpO2 97%   BMI 35.08 kg/m  Alert and oriented x 4 Moving all extremities well Complaining of continued neck pain. OR planned for Thursday

## 2022-05-31 NOTE — Progress Notes (Signed)
.   Transition of Care [facility]) Screening Note   Patient Details  Name: Travis [name] Date of Birth: Dec 20, 1950   Transition of Care [facility]) CM/SW Contact:    Illene Regulus, LCSW Phone Number: 05/31/2022, 9:14 AM    Transition of Care Department [facility]) has reviewed patient and no TOC needs have been identified at this time. We will continue to monitor patient advancement through interdisciplinary progression rounds. If new patient transition needs arise, please place a TOC consult.

## 2022-06-01 ENCOUNTER — Ambulatory Visit: Payer: Medicare Other

## 2022-06-01 ENCOUNTER — Ambulatory Visit: Payer: Medicare Other | Admitting: Physician Assistant

## 2022-06-01 ENCOUNTER — Other Ambulatory Visit: Payer: Medicare Other

## 2022-06-01 DIAGNOSIS — C9000 Multiple myeloma not having achieved remission: Secondary | ICD-10-CM | POA: Diagnosis not present

## 2022-06-01 DIAGNOSIS — K5909 Other constipation: Secondary | ICD-10-CM | POA: Diagnosis not present

## 2022-06-01 DIAGNOSIS — E559 Vitamin D deficiency, unspecified: Secondary | ICD-10-CM | POA: Diagnosis not present

## 2022-06-01 DIAGNOSIS — M4850XA Collapsed vertebra, not elsewhere classified, site unspecified, initial encounter for fracture: Secondary | ICD-10-CM | POA: Diagnosis not present

## 2022-06-01 LAB — BASIC METABOLIC PANEL
Anion gap: 7 (ref 5–15)
BUN: 25 mg/dL — ABNORMAL HIGH (ref 8–23)
CO2: 24 mmol/L (ref 22–32)
Calcium: 8.2 mg/dL — ABNORMAL LOW (ref 8.9–10.3)
Chloride: 106 mmol/L (ref 98–111)
Creatinine, Ser: 0.81 mg/dL (ref 0.61–1.24)
GFR, Estimated: >60 mL/min (ref >60)
Glucose, Bld: 157 mg/dL — ABNORMAL HIGH (ref 70–99)
Potassium: 3.6 mmol/L (ref 3.5–5.1)
Sodium: 137 mmol/L (ref 135–145)

## 2022-06-01 LAB — GLUCOSE, CAPILLARY: Glucose-Capillary: 116 mg/dL — ABNORMAL HIGH (ref 70–99)

## 2022-06-01 LAB — CBC
HCT: 39.6 % (ref 39.0–52.0)
Hemoglobin: 13.5 g/dL (ref 13.0–17.0)
MCH: 32.5 pg (ref 26.0–34.0)
MCHC: 34.1 g/dL (ref 30.0–36.0)
MCV: 95.2 fL (ref 80.0–100.0)
Platelets: 161 10*3/uL (ref 150–400)
RBC: 4.16 MIL/uL — ABNORMAL LOW (ref 4.22–5.81)
RDW: 14.6 % (ref 11.5–15.5)
WBC: 10.6 10*3/uL — ABNORMAL HIGH (ref 4.0–10.5)
nRBC: 0 % (ref 0.0–0.2)

## 2022-06-01 MED ORDER — HEPARIN SODIUM (PORCINE) 5000 UNIT/ML IJ SOLN
5000.0000 [IU] | Freq: Three times a day (TID) | INTRAMUSCULAR | Status: DC
  Administered 2022-06-01 – 2022-06-07 (×16): 5000 [IU] via SUBCUTANEOUS
  Filled 2022-06-01 (×16): qty 1

## 2022-06-01 MED ORDER — INSULIN ASPART 100 UNIT/ML IJ SOLN
0.0000 [IU] | Freq: Three times a day (TID) | INTRAMUSCULAR | Status: DC
  Administered 2022-06-02 (×2): 2 [IU] via SUBCUTANEOUS
  Administered 2022-06-03: 1 [IU] via SUBCUTANEOUS
  Administered 2022-06-03 – 2022-06-04 (×3): 2 [IU] via SUBCUTANEOUS
  Administered 2022-06-05 (×3): 1 [IU] via SUBCUTANEOUS
  Administered 2022-06-06 (×2): 2 [IU] via SUBCUTANEOUS
  Administered 2022-06-06: 1 [IU] via SUBCUTANEOUS
  Administered 2022-06-07 (×2): 2 [IU] via SUBCUTANEOUS
  Administered 2022-06-07: 3 [IU] via SUBCUTANEOUS
  Administered 2022-06-08: 2 [IU] via SUBCUTANEOUS
  Administered 2022-06-08 – 2022-06-09 (×2): 1 [IU] via SUBCUTANEOUS
  Administered 2022-06-09: 2 [IU] via SUBCUTANEOUS

## 2022-06-01 NOTE — Progress Notes (Signed)
[facility]                                                                              Travis [name], is a 72 y.o. male, DOB - 1950/06/15, OZD:[id number] Admit date - 05/28/2022    Outpatient Primary MD for the patient is [name], MD  LOS - 3  days  Chief Complaint  Patient presents with   Back Pain       Brief summary   Patient is a 72 year old male with OSA on CPAP, HTN, paroxysmal A-fib on chronic anticoagulation, history of DVT and multiple myeloma.  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  Since last month, he had aching pain in his upper back that has progressively gotten worse in the last few weeks.  PT did not help much.   He was evaluated by sports medicine and had an MRI of the C-spine which showed interval progression of multiple myeloma in the thoracic spine compared with previous CT chest 10 weeks prior and new pathology go fracture at T2 with significant osseous retropulsion and resulting cord compression.  Additional smaller lesions within the T1 vertebral body and right C7 and T3 articulating facets. He was called by his oncologist, Dr. Alvy Bimler to be hospitalized for emergent radiation therapy.  Currently on chemotherapy every 3 weeks.    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  No cord edema or myelomalacia identified.  Small tumor foci at most of the thoracic levels with no thoracic extraosseous or epidural tumor extension.  Posterior rib involvement intermittently noted.  Small layering pleural effusions -MRI L-spine showed small  multiple myeloma lesions scattered at all lumbar levels and in visible left sacral alla, 2.3 cm and left L1 posterior elements and pedicle.  Underlying chronic lumbar spine degeneration with a new central disc herniation at L3-L4 since 2021 now resulting in mild to moderate degenerative spinal stenosis. -Continue scheduled dexamethasone, Rad Onc, oncology, neurosurgery following -Underwent emergent XRT x 1 on 1/27 -Continue pain control and bowel regimen -Per neurosurgery, Dr. Christella Noa, plan for spinal stabilization with tumor resection on Thursday am.  Will transfer to [facility]. -Per patient, pain is controlled with the current pain medication regimen  Active Problems:  Multiple myeloma ([place]) -Appreciate oncology recommendations, recommended to hold Revlimid while hospitalized -Further treatment plan per Dr. Lorenso Courier, received urgent XRT x1 on 1/27 -Continue high-dose vitamin D replacement    Hypertension -Continue lisinopril, Norvasc, BP stable.    Atrial fibrillation (H[facility]) -Heart rate controlled, continue amiodarone -Holding Eliquis for surgery, resume after surgery, currently in normal sinus rhythm    OSA (obstructive sleep apnea) - CPAP QHS    History of DVT (deep vein thrombosis) -Hold Eliquis per oncology recommendations -d/w Dr Alvy Bimler, recommended Lovenox for DVT prophylaxis  Other constipation -Placed on scheduled laxatives, MiraLAX, Senokot-S  Hyperglycemia -Likely due to steroids, will place on sliding scale insulin sensitive  Obesity Estimated body mass index is 35.08 kg/m as calculated from the following:   Height as of this encounter: '5\' 10"'$  (1.778 m).   Weight as of this encounter: 110.9 kg.  Code Status: Full code DVT Prophylaxis:  SCDs Start: 05/29/22 0348   Level of Care: Level of care: Progressive Family Communication: Updated patient Disposition Plan:      Remains inpatient appropriate: High risk of neurological compromise from T2 vertebral fracture  and cord compression.  [facility] to  [facility] for surgery planned Thursday a.m.   Procedures:  MRI T-spine, L-spine  Consultants:   Oncology Radiation oncology Neurosurgery, Dr. Christella Noa  Antimicrobials:   Anti-infectives (From admission, onward)    Start     Dose/Rate Route Frequency Ordered Stop   05/28/22 2345  acyclovir (ZOVIRAX) tablet 400 mg        400 mg Oral 2 times daily 05/28/22 2333            Medications  acyclovir  400 mg Oral BID   amiodarone  200 mg Oral Daily   amLODipine  10 mg Oral Daily   cholecalciferol  2,000 Units Oral Daily   dexamethasone (DECADRON) injection  4 mg Intravenous Q6H   enoxaparin (LOVENOX) injection  50 mg Subcutaneous H63J   folic acid  1 mg Oral Daily   gabapentin  600 mg Oral BID   hydrALAZINE  37.5 mg Oral TID   lisinopril  40 mg Oral Daily   polyethylene glycol  17 g Oral Daily   senna-docusate  2 tablet Oral BID      Subjective:   Dailen Mcclish was seen and examined today.  Still having some pain in the right shoulder and scapular region although much better since the time of admission.  No focal weakness.  Tolerating diet.  No chest pain, shortness of breath nausea or vomiting.  Objective:   Vitals:   05/31/22 1355 05/31/22 2113 06/01/22 0541 06/01/22 1228  BP: 130/64 138/73 139/78 (!) 146/70  Pulse: 73 67 64 66  Resp: '18 17 16 17  '$ Temp: 97.8 F (36.6 C) 98.1 F (36.7 C) 98.3 F (36.8 C) 98.4 F (36.9 C)  TempSrc:  Oral Oral Oral  SpO2: 97% 97% 95% 96%  Weight:      Height:       No intake or output data in the 24 hours ending 06/01/22 1330    Wt Readings from Last 3 Encounters:  05/29/22 110.9 kg  05/18/22 111.8 kg  05/13/22 112.9 kg   Physical Exam General: Alert and oriented x 3, NAD Cardiovascular: S1 S2 clear, RRR.  Respiratory: CTAB, no wheezing Gastrointestinal: Soft, nontender, nondistended, NBS Ext: no pedal edema bilaterally Neuro: no new deficits Skin: No rashes Psych: Normal affect     Data Reviewed:  I have personally reviewed following labs    CBC Lab Results  Component Value Date   WBC 10.6 (H) 06/01/2022   RBC 4.16 (L) 06/01/2022   HGB 13.5 06/01/2022   HCT 39.6 06/01/2022   MCV 95.2 06/01/2022   MCH 32.5 06/01/2022   PLT 161 06/01/2022   MCHC 34.1 06/01/2022   RDW 14.6 06/01/2022   LYMPHSABS 0.1 (L) 05/29/2022   MONOABS 0.2 05/29/2022   EOSABS 0.0 05/29/2022   BASOSABS 0.0 49/70/2637     Last metabolic panel Lab Results  Component Value Date   NA  137 06/01/2022   K 3.6 06/01/2022   CL 106 06/01/2022   CO2 24 06/01/2022   BUN 25 (H) 06/01/2022   CREATININE 0.81 06/01/2022   GLUCOSE 157 (H) 06/01/2022   GFRNONAA >60 06/01/2022   GFRAA 108 12/04/2019   CALCIUM 8.2 (L) 06/01/2022   PROT 6.2 05/27/2022   ALBUMIN 4.1 05/27/2022   LABGLOB 2.0 (L) 05/11/2022   AGRATIO 2.4 (H) 04/23/2022   BILITOT 0.4 05/27/2022   ALKPHOS 97 05/27/2022   AST 13 05/27/2022   ALT 21 05/27/2022   ANIONGAP 7 06/01/2022      Radiology Studies: I have personally reviewed the imaging studies  No results found.     Estill Cotta M.D. [facility] 06/01/2022, 1:30 PM  Available via Epic secure chat 7am-7pm After 7 pm, please refer to night coverage provider listed on amion.

## 2022-06-02 ENCOUNTER — Ambulatory Visit: Payer: Medicare Other | Admitting: Physical Therapy

## 2022-06-02 DIAGNOSIS — M4850XA Collapsed vertebra, not elsewhere classified, site unspecified, initial encounter for fracture: Secondary | ICD-10-CM | POA: Diagnosis not present

## 2022-06-02 LAB — BASIC METABOLIC PANEL
Anion gap: 11 (ref 5–15)
BUN: 24 mg/dL — ABNORMAL HIGH (ref 8–23)
CO2: 23 mmol/L (ref 22–32)
Calcium: 8 mg/dL — ABNORMAL LOW (ref 8.9–10.3)
Chloride: 102 mmol/L (ref 98–111)
Creatinine, Ser: 0.77 mg/dL (ref 0.61–1.24)
GFR, Estimated: >60 mL/min (ref >60)
Glucose, Bld: 145 mg/dL — ABNORMAL HIGH (ref 70–99)
Potassium: 3.7 mmol/L (ref 3.5–5.1)
Sodium: 136 mmol/L (ref 135–145)

## 2022-06-02 LAB — CBC
HCT: 39.7 % (ref 39.0–52.0)
Hemoglobin: 13.5 g/dL (ref 13.0–17.0)
MCH: 31.9 pg (ref 26.0–34.0)
MCHC: 34 g/dL (ref 30.0–36.0)
MCV: 93.9 fL (ref 80.0–100.0)
Platelets: 153 10*3/uL (ref 150–400)
RBC: 4.23 MIL/uL (ref 4.22–5.81)
RDW: 14.1 % (ref 11.5–15.5)
WBC: 9.9 10*3/uL (ref 4.0–10.5)
nRBC: 0 % (ref 0.0–0.2)

## 2022-06-02 LAB — MULTIPLE MYELOMA PANEL, SERUM
Albumin SerPl Elph-Mcnc: 3.8 g/dL (ref 2.9–4.4)
Albumin/Glob SerPl: 1.9 — ABNORMAL HIGH (ref 0.7–1.7)
Alpha 1: 0.2 g/dL (ref 0.0–0.4)
Alpha2 Glob SerPl Elph-Mcnc: 0.8 g/dL (ref 0.4–1.0)
B-Globulin SerPl Elph-Mcnc: 0.9 g/dL (ref 0.7–1.3)
Gamma Glob SerPl Elph-Mcnc: 0.2 g/dL — ABNORMAL LOW (ref 0.4–1.8)
Globulin, Total: 2.1 g/dL — ABNORMAL LOW (ref 2.2–3.9)
IgA/Immunoglobulin A, Serum: 55 mg/dL — ABNORMAL LOW (ref 61–437)
IgG (Immunoglobin G), Serum: 348 mg/dL — ABNORMAL LOW (ref 603–1613)
IgM (Immunoglobulin M), Srm: 11 mg/dL — ABNORMAL LOW (ref 15–143)
M Protein SerPl Elph-Mcnc: 0.1 g/dL — ABNORMAL HIGH
Total Protein: 5.9 g/dL — ABNORMAL LOW (ref 6.0–8.5)

## 2022-06-02 LAB — HEMOGLOBIN A1C
Hgb A1c MFr Bld: 5.5 % (ref 4.8–5.6)
Mean Plasma Glucose: 111.15 mg/dL

## 2022-06-02 LAB — GLUCOSE, CAPILLARY
Glucose-Capillary: 165 mg/dL — ABNORMAL HIGH (ref 70–99)
Glucose-Capillary: 175 mg/dL — ABNORMAL HIGH (ref 70–99)
Glucose-Capillary: 183 mg/dL — ABNORMAL HIGH (ref 70–99)

## 2022-06-02 NOTE — Progress Notes (Signed)
[facility]                                                                              Travis [name], is a 72 y.o. male, DOB - 02-Apr-1951, VCB:[id number] Admit date - 05/28/2022    Outpatient Primary MD for the patient is Travis Frames, MD  LOS - 4  days  Chief Complaint  Patient presents with   Back Pain       Brief summary   Patient is a 72 year old male with OSA on CPAP, HTN, paroxysmal A-fib on chronic anticoagulation, history of DVT and multiple myeloma.  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  Since last month, he had aching pain in his upper back that has progressively gotten worse in the last few weeks.  PT did not help much.   He was evaluated by sports medicine and had an MRI of the C-spine which showed interval progression of multiple myeloma in the thoracic spine compared with previous CT chest 10 weeks prior and new pathology go fracture at T2 with significant osseous retropulsion and resulting cord compression.  Additional smaller lesions within the T1 vertebral body and right C7 and T3 articulating facets. He was called by his oncologist, Dr. Alvy Bimler to be hospitalized for emergent radiation therapy.  Currently on chemotherapy every 3 weeks.    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  No cord edema or myelomalacia identified.  Small tumor foci at most of the thoracic levels with no thoracic extraosseous or epidural tumor extension.  Posterior rib involvement intermittently noted.  Small layering pleural effusions -MRI L-spine showed small  multiple myeloma lesions scattered at all lumbar levels and in visible left sacral alla, 2.3 cm and left L1 posterior elements and pedicle.  Underlying chronic lumbar spine degeneration with a new central disc herniation at L3-L4 since 2021 now resulting in mild to moderate degenerative spinal stenosis. -Continue scheduled dexamethasone, Rad Onc, oncology, neurosurgery following -Underwent emergent XRT x 1 on 1/27 -Continue pain control and bowel regimen -Per neurosurgery, Dr. Christella Noa, plan for spinal stabilization with tumor resection on Thursday am.  Will transfer to [facility]. -Per patient, pain is controlled with the current pain medication regimen, he is not able to lift his head up but able to ambulate, denies bowel and bladder issues -case discussed with flow manager several times over the phone , flow manager is aware of need of  [facility]  Active Problems:  Multiple myeloma ([place]) -Appreciate oncology recommendations, recommended to hold Revlimid while hospitalized -Further treatment plan per Dr. Lorenso Courier, received urgent XRT x1 on 1/27 -Continue high-dose vitamin D replacement    Hypertension -Continue lisinopril, Norvasc, BP stable.    Atrial fibrillation ([place]) -Heart rate controlled, continue amiodarone -Holding  Eliquis for surgery, resume after surgery, currently in normal sinus rhythm    OSA (obstructive sleep apnea) - CPAP QHS    History of DVT (deep vein thrombosis) -Hold Eliquis per oncology recommendations -d/w Dr Alvy Bimler, recommended Lovenox for DVT prophylaxis    Other constipation -Placed on scheduled laxatives, MiraLAX, Senokot-S  Hyperglycemia -Likely due to steroids, will place on sliding scale insulin sensitive  Obesity Estimated body mass index is 35.08 kg/m as calculated from the following:   Height as of this encounter: '5\' 10"'$  (1.778 m).   Weight as of this encounter: 110.9 kg.  Code Status: Full code DVT Prophylaxis:   heparin injection 5,000 Units Start: 06/01/22 2200 SCDs Start: 05/29/22 0348   Level of Care: Level of care: Telemetry Family Communication: Updated patient Disposition Plan:      Remains inpatient appropriate: High risk of neurological compromise from T2 vertebral fracture and cord compression.  Transfer to  [facility] for surgery planned Thursday a.m.   Procedures:  MRI T-spine, L-spine  Consultants:   Oncology Radiation oncology Neurosurgery, Dr. Christella Noa  Antimicrobials:   Anti-infectives (From admission, onward)    Start     Dose/Rate Route Frequency Ordered Stop   05/28/22 2345  acyclovir (ZOVIRAX) tablet 400 mg        400 mg Oral 2 times daily 05/28/22 2333            Medications  acyclovir  400 mg Oral BID   amiodarone  200 mg Oral Daily   amLODipine  10 mg Oral Daily   cholecalciferol  2,000 Units Oral Daily   dexamethasone (DECADRON) injection  4 mg Intravenous Q2V   folic acid  1 mg Oral Daily   gabapentin  600 mg Oral BID   heparin injection (subcutaneous)  5,000 Units Subcutaneous Q8H   hydrALAZINE  37.5 mg Oral TID   insulin aspart  0-9 Units Subcutaneous TID WC   lisinopril  40 mg Oral Daily   polyethylene glycol  17 g Oral Daily   senna-docusate  2 tablet Oral BID      Subjective:   No acute overnight event, he is ambulating in room, currently denies pain, states I can not life my head up, denies bowel and bladder issues  Tolerating diet.  No chest pain, shortness of breath nausea or vomiting.  Objective:   Vitals:   06/01/22 1228 06/01/22 2314 06/02/22 0419 06/02/22 1306  BP: (!) 146/70 131/71 (!) 153/83 (!) 152/77  Pulse: 66 (!) 59 65 65  Resp: '17 17 17 17  '$ Temp: 98.4 F (36.9 C) 98.3 F (36.8 C) 98.4 F (36.9 C) 97.7 F (36.5 C)  TempSrc: Oral Oral Oral   SpO2: 96% 96% 96% 99%  Weight:      Height:        Intake/Output Summary (Last 24 hours) at 06/02/2022 1316 Last data filed at 06/02/2022 1310 Gross per 24 hour  Intake 480 ml   Output --  Net 480 ml      Wt Readings from Last 3 Encounters:  05/29/22 110.9 kg  05/18/22 111.8 kg  05/13/22 112.9 kg   Physical Exam General: Alert and oriented x 3, NAD Cardiovascular: S1 S2 clear, RRR.  Respiratory: CTAB, no wheezing Gastrointestinal: Soft, nontender, nondistended, NBS Ext: no pedal edema bilaterally Neuro: no new deficits Skin: No rashes Psych: Normal affect    Data Reviewed:  I have personally reviewed following labs    CBC Lab Results  Component Value Date   WBC 9.9  06/02/2022   RBC 4.23 06/02/2022   HGB 13.5 06/02/2022   HCT 39.7 06/02/2022   MCV 93.9 06/02/2022   MCH 31.9 06/02/2022   PLT 153 06/02/2022   MCHC 34.0 06/02/2022   RDW 14.1 06/02/2022   LYMPHSABS 0.1 (L) 05/29/2022   MONOABS 0.2 05/29/2022   EOSABS 0.0 05/29/2022   BASOSABS 0.0 61/95/0932     Last metabolic panel Lab Results  Component Value Date   NA 136 06/02/2022   K 3.7 06/02/2022   CL 102 06/02/2022   CO2 23 06/02/2022   BUN 24 (H) 06/02/2022   CREATININE 0.77 06/02/2022   GLUCOSE 145 (H) 06/02/2022   GFRNONAA >60 06/02/2022   GFRAA 108 12/04/2019   CALCIUM 8.0 (L) 06/02/2022   PROT 6.2 05/27/2022   ALBUMIN 4.1 05/27/2022   LABGLOB 2.0 (L) 05/11/2022   AGRATIO 2.4 (H) 04/23/2022   BILITOT 0.4 05/27/2022   ALKPHOS 97 05/27/2022   AST 13 05/27/2022   ALT 21 05/27/2022   ANIONGAP 11 06/02/2022      Radiology Studies: I have personally reviewed the imaging studies  No results found.     Florencia Reasons M.D. PhD FACP [facility] 06/02/2022, 1:16 PM  Available via Epic secure chat 7am-7pm After 7 pm, please refer to night coverage provider listed on amion.

## 2022-06-02 NOTE — Progress Notes (Signed)
Attempted to call report to nurse receiving patient at [facility]; nurse unavailable; left number to call back for report

## 2022-06-03 ENCOUNTER — Inpatient Hospital Stay (HOSPITAL_COMMUNITY): Payer: Medicare Other

## 2022-06-03 DIAGNOSIS — M4850XA Collapsed vertebra, not elsewhere classified, site unspecified, initial encounter for fracture: Secondary | ICD-10-CM | POA: Diagnosis not present

## 2022-06-03 LAB — BASIC METABOLIC PANEL
Anion gap: 9 (ref 5–15)
BUN: 22 mg/dL (ref 8–23)
CO2: 23 mmol/L (ref 22–32)
Calcium: 7.8 mg/dL — ABNORMAL LOW (ref 8.9–10.3)
Chloride: 105 mmol/L (ref 98–111)
Creatinine, Ser: 0.8 mg/dL (ref 0.61–1.24)
GFR, Estimated: >60 mL/min (ref >60)
Glucose, Bld: 144 mg/dL — ABNORMAL HIGH (ref 70–99)
Potassium: 3.6 mmol/L (ref 3.5–5.1)
Sodium: 137 mmol/L (ref 135–145)

## 2022-06-03 LAB — GLUCOSE, CAPILLARY
Glucose-Capillary: 129 mg/dL — ABNORMAL HIGH (ref 70–99)
Glucose-Capillary: 123 mg/dL — ABNORMAL HIGH (ref 70–99)
Glucose-Capillary: 137 mg/dL — ABNORMAL HIGH (ref 70–99)
Glucose-Capillary: 161 mg/dL — ABNORMAL HIGH (ref 70–99)
Glucose-Capillary: 249 mg/dL — ABNORMAL HIGH (ref 70–99)

## 2022-06-03 LAB — SURGICAL PCR SCREEN
MRSA, PCR: NEGATIVE
Staphylococcus aureus: NEGATIVE

## 2022-06-03 MED ORDER — CHLORHEXIDINE GLUCONATE CLOTH 2 % EX PADS
6.0000 | MEDICATED_PAD | Freq: Once | CUTANEOUS | Status: AC
  Administered 2022-06-03: 6 via TOPICAL

## 2022-06-03 MED ORDER — CEFAZOLIN SODIUM-DEXTROSE 2-4 GM/100ML-% IV SOLN
2.0000 g | INTRAVENOUS | Status: AC
  Filled 2022-06-03: qty 100

## 2022-06-03 MED ORDER — CHLORHEXIDINE GLUCONATE CLOTH 2 % EX PADS: 6.0000 | MEDICATED_PAD | Freq: Once | CUTANEOUS | Status: DC

## 2022-06-03 MED ORDER — IOHEXOL 350 MG/ML SOLN
75.0000 mL | Freq: Once | INTRAVENOUS | Status: AC | PRN
  Administered 2022-06-03: 75 mL via INTRAVENOUS

## 2022-06-03 NOTE — Progress Notes (Signed)
Patient ID: Travis [name], male   DOB: [birth year], 72 y.o.   MRN: [id number] BP 132/76 (BP Location: Left Arm)   Pulse 67   Temp (!) 97.4 F (36.3 C) (Oral)   Resp 15   Ht '5\' 10"'$  (1.778 m)   Wt 110.9 kg   SpO2 97%   BMI 35.08 kg/m  Alert and oriented x 4, speech is clear and fluent Moving all extremities well OR tomorrow, CT today

## 2022-06-03 NOTE — Hospital Course (Addendum)
71-yom w/ OSA on CPAP, HTN, PAF on chronic anticoagulation, history of DVT and multiple myeloma- diagnosed in 2018 and smoldering MM, and in July 2023, became more aggressive and had a lesion in his right humerus that had to be orthopedically repaired and since last month, he had aching pain in his upper back that has progressively gotten worse in the last few weeks. PT did not help much.   He was evaluated by sports medicine and had an MRI of the C-spine which showed interval progression of multiple myeloma in the thoracic spine compared with previous CT chest 10 weeks prior and new pathology go fracture at T2 with significant osseous retropulsion and resulting cord compression.Additional smaller lesions within the T1 vertebral body and right C7 and T3 articulating facets. He was called by his oncologist, Dr. Alvy Bimler to be hospitalized for emergent radiation therapy.  Currently on chemotherapy every 3 weeks Radiation oncology, neurosurgery consulted> with plan for OR for spinal stabilization and tumor resection 06/04/22.  He had CT thoracic spine with contrast suboptimal but showed extensive tumor replacement of the vertebral body T12 with collapse and paravertebral/spinal canal mass. Pt is s/p 1 unit PRBC during OR.  2/2 Post op moving to ICU

## 2022-06-03 NOTE — TOC Initial Note (Signed)
Transition of Care [facility]) - Initial/Assessment Note    Patient Details  Name: Travis [name] MRN: [id number] Date of Birth: Jan 25, 1951  Transition of [facility]) CM/SW Contact:    Pollie Friar, RN Phone Number: 06/03/2022, 1:37 PM  Clinical Narrative:                 Plan is for surgery tomorrow. Pt is from home with spouse.  TOC following.  Expected Discharge Plan: Home/Self Care Barriers to Discharge: Continued Medical Work up   Patient Goals and CMS Choice            Expected Discharge Plan and Services   Discharge Planning Services: CM Consult                                          Prior Living Arrangements/Services   Lives with:: Spouse                   Activities of Daily Living Home Assistive Devices/Equipment: None ADL Screening (condition at time of admission) Patient's cognitive ability adequate to safely complete daily activities?: Yes Is the patient deaf or have difficulty hearing?: No Does the patient have difficulty seeing, even when wearing glasses/contacts?: No Does the patient have difficulty concentrating, remembering, or making decisions?: No Patient able to express need for assistance with ADLs?: Yes Does the patient have difficulty dressing or bathing?: No Independently performs ADLs?: Yes (appropriate for developmental age) Does the patient have difficulty walking or climbing stairs?: No Weakness of Legs: None Weakness of Arms/Hands: None  Permission Sought/Granted                  Emotional Assessment              Admission diagnosis:  Cord compression ([place]) [G95.20] Multiple myeloma ([place]) [C90.00] Pathologic compression fracture of spine, initial encounter ([place]) [M48.50XA] Multiple myeloma, remission status unspecified ([place]) [C90.00] Patient Active Problem List   Diagnosis Date Noted   Vitamin D deficiency 05/30/2022   Other constipation 05/29/2022   Pathologic compression fracture of spine,  initial encounter ([place]) 05/29/2022   Cord compression ([place]) 05/28/2022   Degenerative cervical disc 05/14/2022   Secondary hypercoagulable state ([place]) 01/11/2022   Multiple myeloma ([place]) 12/10/2021   Medication monitoring encounter 02/27/2021   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   Status post peripherally inserted central catheter (PICC) central line placement 11/06/2020   Drug rash with eosinophilia and systemic symptoms syndrome 11/06/2020   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/2020   PAF (paroxysmal atrial fibrillation) ([name])    GERD (gastroesophageal reflux disease) 03/29/2018   Hiatal hernia 03/29/2018   History of DVT (deep vein thrombosis) 03/29/2018   Osteoarthritis 03/29/2018   Neuropathy 08/29/2017   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 [place], [place]. [address]. [place] [address] Phone: [phone] Fax: [phone]  [facility] [address] [place] [address] [address]  [place] [address] Phone: [phone] Fax: [phone]     Social Determinants of Health (SDOH) Social History: SDOH Screenings   Food Insecurity: No Food Insecurity (05/29/2022)  Housing: Low Risk  (05/29/2022)  Transportation Needs: No Transportation Needs (05/29/2022)  Utilities: Not At Risk (05/29/2022)  Depression (PHQ2-9): Low Risk  (10/16/2020)  Tobacco Use: Low Risk  (05/28/2022)   SDOH Interventions:     Readmission Risk Interventions     No data to display

## 2022-06-03 NOTE — Progress Notes (Signed)
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 PT did not help much.   He was evaluated by sports medicine and had an MRI of the C-spine which showed interval progression of multiple myeloma in the thoracic spine compared with previous CT chest 10 weeks prior and new pathology go fracture at T2 with significant osseous retropulsion and resulting cord compression.Additional smaller lesions within the T1 vertebral body and right C7 and T3 articulating facets. He was called by his oncologist, Dr. Alvy Bimler to be hospitalized for emergent radiation therapy.  Currently on chemotherapy every 3 weeks Radiation oncology, neurosurgery consulted> with plan for OR for spinal stabilization and tumor resection 2/1   Subjective: SEEN AND EXAMINED SOME PAIN ON UPPER BACK AT FRACTURE SITE NO CHEST PAIN NAUSEA VOMITING Overnight patient has been afebrile BP stable Lab with a stable BMP sugar 140s   Assessment and Plan: Principal Problem:   Pathologic compression fracture of spine, initial encounter [facility]) Active Problems:   Hypertension   Atrial fibrillation (HCC)   OSA (obstructive sleep apnea)   History of DVT (deep vein thrombosis)   Multiple myeloma (HCC)   Cord compression (HCC)   Other constipation   Vitamin D deficiency   Pathological compression fracture of T12 vertebra at risk of cord compression: Underwent MRI thoracic spine, LS spine (see report for detail)-seen by radiation oncology underwent emergent XRT x1,  10/27, seen by  neurosurgery Dr Larose Hires: With plan for stabilization with tumor resection> planning on 2/2.  Continue pain control, bowel regimen, Decadron.  Monitor.  Multiple myeloma: Seen by oncology holding Revlimid while hospitalized with plan to follow-up with Dr. Lorenso Courier.  Continue vitamin D replacement. Hypertension: BP well-controlled on lisinopril Norvasc and hydralazine Paroxysmal atrial fibrillation continue amiodarone and Eliquis on hold for surgery resume after surgery after discussing with neurosurgeon OSA continue CPAP nightly Constipation continue scheduled laxative MiraLAX History of DVT on Eliquis currently on hold>d/w Dr Alvy Bimler, recommended Lovenox for DVT prophylaxis Hyperglycemia:Likely due to steroids, continue SSI.   Class I Obesity:Patient's Body mass index is 35.08 kg/m. : Will benefit with PCP follow-up, weight loss  healthy lifestyle and outpatient sleep evaluation.   DVT prophylaxis: SCD's Start: 06/03/22 0918 heparin injection 5,000 Units Start: 06/01/22 2200 SCDs Start: 05/29/22 0348 Code Status:   Code Status: Full Code Family Communication: plan of care discussed with patient  at bedside. Patient status is: inpatient  because of vertebrae fracture Level of care: Telemetry Medical   Dispo: The patient is from: home w/ wife            Anticipated disposition: TBD Objective: Vitals last 24 hrs: Vitals:   06/03/22 0000 06/03/22 0317 06/03/22 0712 06/03/22 1110  BP: 131/75 132/73 130/72 138/74  Pulse: (!) 59 (!) 57 (!) 58 62  Resp:   14 16  Temp: (!) 97.5 F (36.4 C) 98.3 F (36.8 C) 97.9 F (36.6 C) 98 F (36.7 C)  TempSrc: Oral Oral Oral Oral  SpO2: 94% 96% 96% 96%  Weight:      Height:  Weight change:   Physical Examination:  General exam: alert awake, 72 older than stated age HEENT:Oral mucosa moist, Ear/Nose WNL grossly Respiratory system: bilaterally CLEAR BS, no use of accessory muscle Cardiovascular system: S1 & S2 +, No JVD. Gastrointestinal  system: Abdomen soft,NT,ND, BS+ Nervous System:Alert, awake, moving extremities. Extremities: LE edema NEG,distal peripheral pulses palpable.  Skin: No rashes,no icterus. MSK: Normal muscle bulk,tone, power  Medications reviewed:  Scheduled Meds:  acyclovir  400 mg Oral BID   amiodarone  200 mg Oral Daily   amLODipine  10 mg Oral Daily   Chlorhexidine Gluconate Cloth  6 each Topical Once   cholecalciferol  2,000 Units Oral Daily   dexamethasone (DECADRON) injection  4 mg Intravenous Q9U   folic acid  1 mg Oral Daily   gabapentin  600 mg Oral BID   heparin injection (subcutaneous)  5,000 Units Subcutaneous Q8H   hydrALAZINE  37.5 mg Oral TID   insulin aspart  0-9 Units Subcutaneous TID WC   lisinopril  40 mg Oral Daily   polyethylene glycol  17 g Oral Daily   senna-docusate  2 tablet Oral BID   Continuous Infusions:   ceFAZolin (ANCEF) IV        Diet Order             Diet NPO time specified  Diet effective midnight           Diet regular Room service appropriate? Yes; Fluid consistency: Thin  Diet effective now                   Intake/Output Summary (Last 24 hours) at 06/03/2022 1258 Last data filed at 06/02/2022 1700 Gross per 24 hour  Intake 840 ml  Output 200 ml  Net 640 ml   Net IO Since Admission: 1,710 mL [06/03/22 1258]  Wt Readings from Last 3 Encounters:  05/29/22 110.9 kg  05/18/22 111.8 kg  05/13/22 112.9 kg     Unresulted Labs (From admission, onward)    None     Data Reviewed: I have personally reviewed following labs and imaging studies CBC: Recent Labs  Lab 05/29/22 0444 05/31/22 0340 06/01/22 0418 06/02/22 0441  WBC 6.5 10.4 10.6* 9.9  NEUTROABS 6.1  --   --   --   HGB 13.9 13.6 13.5 13.5  HCT 40.8 39.4 39.6 39.7  MCV 96.7 93.8 95.2 93.9  PLT 181 176 161 765   Basic Metabolic Panel: Recent Labs  Lab 05/29/22 0444 05/30/22 0422 05/31/22 0340 06/01/22 0418 06/02/22 0441 06/03/22 0332  NA 136 137 135 137 136 137  K 3.9  3.6 3.5 3.6 3.7 3.6  CL 103 104 102 106 102 105  CO2 '23 23 24 24 23 23  '$ GLUCOSE 152* 148* 156* 157* 145* 144*  BUN '13 22 22 '$ 25* 24* 22  CREATININE 0.80 0.78 0.86 0.81 0.77 0.80  CALCIUM 8.2* 8.3* 7.9* 8.2* 8.0* 7.8*  MG 2.3  --   --   --   --   --    GFR: Estimated Creatinine Clearance: 105.7 mL/min (by C-G formula based on SCr of 0.8 mg/dL). Liver Function Tests: No results for input(s): "AST", "ALT", "ALKPHOS", "BILITOT", "PROT", "ALBUMIN" in the last 168 hours.  Recent Labs    11/17/21 1513  PROBNP 232   HbA1C: Recent Labs    06/02/22 0441  HGBA1C 5.5   CBG: Recent Labs  Lab 06/02/22 1123 06/02/22 1617 06/02/22 2056 06/03/22 0547 06/03/22 1133  GLUCAP 165* 175* 183*  129* 123*   Recent Results (from the past 240 hour(s))  Surgical pcr screen     Status: None   Collection Time: 06/03/22  9:20 AM   Specimen: Nasal Mucosa; Nasal Swab  Result Value Ref Range Status   MRSA, PCR NEGATIVE NEGATIVE Final   Staphylococcus aureus NEGATIVE NEGATIVE Final    Comment: (NOTE) The Xpert SA Assay (FDA approved for NASAL specimens in patients 62 years of age and older), is one component of a comprehensive surveillance program. It is not intended to diagnose infection nor to guide or monitor treatment. Performed at [facility], [place] [address] [place], [place] [address]     Antimicrobials: Anti-infectives (From admission, onward)    Start     Dose/Rate Route Frequency Ordered Stop   06/03/22 1300  ceFAZolin (ANCEF) IVPB 2g/100 mL premix        2 g 200 mL/hr over 30 Minutes Intravenous On call to O.R. 06/03/22 0917 06/04/22 0559   05/28/22 2345  acyclovir (ZOVIRAX) tablet 400 mg        400 mg Oral 2 times daily 05/28/22 2333        Culture/Microbiology    Component Value Date/Time   SDES  10/13/2020 1355    URINE, RANDOM Performed at [facility], [place] [address], [place], [place] [address]    SPECREQUEST  10/13/2020 1355     NONE Performed at [facility], [place] [address], [place], [place] [address]    CULT  10/13/2020 1355    NO GROWTH Performed at [place] [address], [place], [place] [address]    REPTSTATUS 10/14/2020 FINAL 10/13/2020 1355   Other culture-see note  Radiology Studies: No results found.   LOS: 5 days   Antonieta Pert, MD [facility]  06/03/2022, 12:58 PM

## 2022-06-03 NOTE — Care Management Important Message (Signed)
Important Message  Patient Details  Name: Travis [name] MRN: [id number] Date of Birth: [birth year]   Medicare Important Message Given:  Yes     [name] 06/03/2022, 3:26 PM

## 2022-06-04 ENCOUNTER — Inpatient Hospital Stay (HOSPITAL_COMMUNITY): Payer: Medicare Other

## 2022-06-04 ENCOUNTER — Ambulatory Visit: Payer: Medicare Other | Admitting: Radiation Oncology

## 2022-06-04 ENCOUNTER — Inpatient Hospital Stay (HOSPITAL_COMMUNITY): Payer: Medicare Other | Admitting: Certified Registered"

## 2022-06-04 ENCOUNTER — Other Ambulatory Visit: Payer: Self-pay

## 2022-06-04 ENCOUNTER — Inpatient Hospital Stay (HOSPITAL_COMMUNITY)
Admission: EM | Disposition: A | Discharge status: Home Health | Payer: Self-pay | Source: Home / Self Care | Attending: Neurosurgery

## 2022-06-04 DIAGNOSIS — M8448XA Pathological fracture, other site, initial encounter for fracture: Secondary | ICD-10-CM | POA: Diagnosis not present

## 2022-06-04 DIAGNOSIS — I10 Essential (primary) hypertension: Secondary | ICD-10-CM

## 2022-06-04 DIAGNOSIS — Z9989 Dependence on other enabling machines and devices: Secondary | ICD-10-CM

## 2022-06-04 DIAGNOSIS — M4850XA Collapsed vertebra, not elsewhere classified, site unspecified, initial encounter for fracture: Secondary | ICD-10-CM | POA: Diagnosis not present

## 2022-06-04 DIAGNOSIS — G4733 Obstructive sleep apnea (adult) (pediatric): Secondary | ICD-10-CM

## 2022-06-04 HISTORY — PX: POSTERIOR CERVICAL FUSION/FORAMINOTOMY: SHX5038

## 2022-06-04 LAB — POCT I-STAT 7, (LYTES, BLD GAS, ICA,H+H)
Acid-base deficit: 2 mmol/L (ref 0.0–2.0)
Acid-base deficit: 4 mmol/L — ABNORMAL HIGH (ref 0.0–2.0)
Acid-base deficit: 3 mmol/L — ABNORMAL HIGH (ref 0.0–2.0)
Bicarbonate: 23.1 mmol/L (ref 20.0–28.0)
Bicarbonate: 21.5 mmol/L (ref 20.0–28.0)
Bicarbonate: 22.3 mmol/L (ref 20.0–28.0)
Calcium, Ion: 1.03 mmol/L — ABNORMAL LOW (ref 1.15–1.40)
Calcium, Ion: 1.02 mmol/L — ABNORMAL LOW (ref 1.15–1.40)
Calcium, Ion: 1 mmol/L — ABNORMAL LOW (ref 1.15–1.40)
HCT: 33 % — ABNORMAL LOW (ref 39.0–52.0)
HCT: 30 % — ABNORMAL LOW (ref 39.0–52.0)
HCT: 29 % — ABNORMAL LOW (ref 39.0–52.0)
Hemoglobin: 11.2 g/dL — ABNORMAL LOW (ref 13.0–17.0)
Hemoglobin: 10.2 g/dL — ABNORMAL LOW (ref 13.0–17.0)
Hemoglobin: 9.9 g/dL — ABNORMAL LOW (ref 13.0–17.0)
O2 Saturation: 100 %
O2 Saturation: 100 %
O2 Saturation: 100 %
Patient temperature: 36
Patient temperature: 35.8
Patient temperature: 36.2
Potassium: 3.8 mmol/L (ref 3.5–5.1)
Potassium: 3.7 mmol/L (ref 3.5–5.1)
Potassium: 3.9 mmol/L (ref 3.5–5.1)
Sodium: 138 mmol/L (ref 135–145)
Sodium: 139 mmol/L (ref 135–145)
Sodium: 139 mmol/L (ref 135–145)
TCO2: 24 mmol/L (ref 22–32)
TCO2: 23 mmol/L (ref 22–32)
TCO2: 23 mmol/L (ref 22–32)
pCO2 arterial: 40.1 mmHg (ref 32–48)
pCO2 arterial: 38 mmHg (ref 32–48)
pCO2 arterial: 39.3 mmHg (ref 32–48)
pH, Arterial: 7.364 (ref 7.35–7.45)
pH, Arterial: 7.354 (ref 7.35–7.45)
pH, Arterial: 7.358 (ref 7.35–7.45)
pO2, Arterial: 215 mmHg — ABNORMAL HIGH (ref 83–108)
pO2, Arterial: 237 mmHg — ABNORMAL HIGH (ref 83–108)
pO2, Arterial: 238 mmHg — ABNORMAL HIGH (ref 83–108)

## 2022-06-04 LAB — GLUCOSE, CAPILLARY
Glucose-Capillary: 150 mg/dL — ABNORMAL HIGH (ref 70–99)
Glucose-Capillary: 141 mg/dL — ABNORMAL HIGH (ref 70–99)
Glucose-Capillary: 133 mg/dL — ABNORMAL HIGH (ref 70–99)
Glucose-Capillary: 133 mg/dL — ABNORMAL HIGH (ref 70–99)
Glucose-Capillary: 158 mg/dL — ABNORMAL HIGH (ref 70–99)
Glucose-Capillary: 126 mg/dL — ABNORMAL HIGH (ref 70–99)
Glucose-Capillary: 142 mg/dL — ABNORMAL HIGH (ref 70–99)
Glucose-Capillary: 139 mg/dL — ABNORMAL HIGH (ref 70–99)
Glucose-Capillary: 131 mg/dL — ABNORMAL HIGH (ref 70–99)
Glucose-Capillary: 160 mg/dL — ABNORMAL HIGH (ref 70–99)
Glucose-Capillary: 177 mg/dL — ABNORMAL HIGH (ref 70–99)

## 2022-06-04 LAB — PREPARE RBC (CROSSMATCH)

## 2022-06-04 LAB — MRSA NEXT GEN BY PCR, NASAL: MRSA by PCR Next Gen: NOT DETECTED

## 2022-06-04 SURGERY — POSTERIOR CERVICAL FUSION/FORAMINOTOMY LEVEL 5
Anesthesia: General

## 2022-06-04 MED ORDER — MIDAZOLAM HCL 2 MG/2ML IJ SOLN
INTRAMUSCULAR | Status: DC | PRN
  Administered 2022-06-04: 2 mg via INTRAVENOUS

## 2022-06-04 MED ORDER — THROMBIN 5000 UNITS EX SOLR
CUTANEOUS | Status: AC
  Filled 2022-06-04: qty 5000

## 2022-06-04 MED ORDER — DEXAMETHASONE SODIUM PHOSPHATE 10 MG/ML IJ SOLN
INTRAMUSCULAR | Status: AC
  Filled 2022-06-04: qty 1

## 2022-06-04 MED ORDER — ONDANSETRON HCL 4 MG/2ML IJ SOLN
INTRAMUSCULAR | Status: AC
  Filled 2022-06-04: qty 2

## 2022-06-04 MED ORDER — THROMBIN 5000 UNITS EX SOLR
OROMUCOSAL | Status: DC | PRN
  Administered 2022-06-04 (×4): 5 mL via TOPICAL

## 2022-06-04 MED ORDER — HYDROMORPHONE HCL 1 MG/ML IJ SOLN
INTRAMUSCULAR | Status: AC
  Filled 2022-06-04: qty 0.5

## 2022-06-04 MED ORDER — LIDOCAINE-EPINEPHRINE 0.5 %-1:200000 IJ SOLN
INTRAMUSCULAR | Status: AC
  Filled 2022-06-04: qty 1

## 2022-06-04 MED ORDER — CHLORHEXIDINE GLUCONATE CLOTH 2 % EX PADS
6.0000 | MEDICATED_PAD | Freq: Every day | CUTANEOUS | Status: DC
  Administered 2022-06-04 – 2022-06-09 (×6): 6 via TOPICAL

## 2022-06-04 MED ORDER — LIDOCAINE 2% (20 MG/ML) 5 ML SYRINGE
INTRAMUSCULAR | Status: AC
  Filled 2022-06-04: qty 5

## 2022-06-04 MED ORDER — PHENYLEPHRINE HCL-NACL 20-0.9 MG/250ML-% IV SOLN
INTRAVENOUS | Status: AC
  Filled 2022-06-04: qty 500

## 2022-06-04 MED ORDER — LIDOCAINE-EPINEPHRINE 0.5 %-1:200000 IJ SOLN
INTRAMUSCULAR | Status: DC | PRN
  Administered 2022-06-04: 10 mL

## 2022-06-04 MED ORDER — BACITRACIN ZINC 500 UNIT/GM EX OINT
TOPICAL_OINTMENT | CUTANEOUS | Status: AC
  Filled 2022-06-04: qty 28.35

## 2022-06-04 MED ORDER — ROCURONIUM BROMIDE 10 MG/ML (PF) SYRINGE
PREFILLED_SYRINGE | INTRAVENOUS | Status: DC | PRN
  Administered 2022-06-04 (×2): 20 mg via INTRAVENOUS
  Administered 2022-06-04: 100 mg via INTRAVENOUS
  Administered 2022-06-04: 10 mg via INTRAVENOUS
  Administered 2022-06-04 (×2): 20 mg via INTRAVENOUS
  Administered 2022-06-04: 10 mg via INTRAVENOUS

## 2022-06-04 MED ORDER — SODIUM CHLORIDE 0.9 % IV SOLN
250.0000 mL | INTRAVENOUS | Status: DC
  Administered 2022-06-04 – 2022-06-05 (×2): 250 mL via INTRAVENOUS

## 2022-06-04 MED ORDER — CEFAZOLIN SODIUM-DEXTROSE 2-3 GM-%(50ML) IV SOLR
INTRAVENOUS | Status: DC | PRN
  Administered 2022-06-04 (×2): 2 g via INTRAVENOUS

## 2022-06-04 MED ORDER — THROMBIN 20000 UNITS EX SOLR
CUTANEOUS | Status: AC
  Filled 2022-06-04: qty 20000

## 2022-06-04 MED ORDER — BUPIVACAINE HCL (PF) 0.5 % IJ SOLN
INTRAMUSCULAR | Status: DC | PRN
  Administered 2022-06-04: 30 mL

## 2022-06-04 MED ORDER — SODIUM CHLORIDE 0.9 % IV SOLN: INTRAVENOUS | Status: DC | PRN

## 2022-06-04 MED ORDER — SODIUM BICARBONATE 8.4 % IV SOLN
INTRAVENOUS | Status: AC
  Filled 2022-06-04: qty 50

## 2022-06-04 MED ORDER — PHENYLEPHRINE HCL-NACL 20-0.9 MG/250ML-% IV SOLN: 25.0000 ug/min | INTRAVENOUS | Status: DC

## 2022-06-04 MED ORDER — PHENYLEPHRINE HCL-NACL 20-0.9 MG/250ML-% IV SOLN
INTRAVENOUS | Status: DC | PRN
  Administered 2022-06-04: 30 ug/min via INTRAVENOUS

## 2022-06-04 MED ORDER — PHENYLEPHRINE 80 MCG/ML (10ML) SYRINGE FOR IV PUSH (FOR BLOOD PRESSURE SUPPORT)
PREFILLED_SYRINGE | INTRAVENOUS | Status: DC | PRN
  Administered 2022-06-04 (×5): 80 ug via INTRAVENOUS

## 2022-06-04 MED ORDER — CEFAZOLIN SODIUM 1 G IJ SOLR
INTRAMUSCULAR | Status: AC
  Filled 2022-06-04: qty 20

## 2022-06-04 MED ORDER — PROPOFOL 1000 MG/100ML IV EMUL
INTRAVENOUS | Status: AC
  Filled 2022-06-04: qty 200

## 2022-06-04 MED ORDER — HYDROMORPHONE HCL 1 MG/ML IJ SOLN
INTRAMUSCULAR | Status: DC | PRN
  Administered 2022-06-04: .5 mg via INTRAVENOUS

## 2022-06-04 MED ORDER — PHENYLEPHRINE 80 MCG/ML (10ML) SYRINGE FOR IV PUSH (FOR BLOOD PRESSURE SUPPORT)
PREFILLED_SYRINGE | INTRAVENOUS | Status: AC
  Filled 2022-06-04: qty 10

## 2022-06-04 MED ORDER — PROPOFOL 10 MG/ML IV BOLUS
INTRAVENOUS | Status: AC
  Filled 2022-06-04: qty 20

## 2022-06-04 MED ORDER — FENTANYL CITRATE (PF) 250 MCG/5ML IJ SOLN
INTRAMUSCULAR | Status: DC | PRN
  Administered 2022-06-04 (×2): 50 ug via INTRAVENOUS
  Administered 2022-06-04: 150 ug via INTRAVENOUS
  Administered 2022-06-04: 50 ug via INTRAVENOUS

## 2022-06-04 MED ORDER — EPHEDRINE SULFATE-NACL 50-0.9 MG/10ML-% IV SOSY
PREFILLED_SYRINGE | INTRAVENOUS | Status: DC | PRN
  Administered 2022-06-04 (×2): 5 mg via INTRAVENOUS

## 2022-06-04 MED ORDER — PROPOFOL 1000 MG/100ML IV EMUL
INTRAVENOUS | Status: AC
  Filled 2022-06-04: qty 100

## 2022-06-04 MED ORDER — ROCURONIUM BROMIDE 10 MG/ML (PF) SYRINGE
PREFILLED_SYRINGE | INTRAVENOUS | Status: AC
  Filled 2022-06-04: qty 10

## 2022-06-04 MED ORDER — ALBUMIN HUMAN 5 % IV SOLN: INTRAVENOUS | Status: DC | PRN

## 2022-06-04 MED ORDER — LIDOCAINE 2% (20 MG/ML) 5 ML SYRINGE
INTRAMUSCULAR | Status: DC | PRN
  Administered 2022-06-04: 100 mg via INTRAVENOUS

## 2022-06-04 MED ORDER — FENTANYL CITRATE (PF) 250 MCG/5ML IJ SOLN
INTRAMUSCULAR | Status: AC
  Filled 2022-06-04: qty 5

## 2022-06-04 MED ORDER — SODIUM CHLORIDE 0.9 % IV SOLN: 10.0000 mL/h | Freq: Once | INTRAVENOUS | Status: DC

## 2022-06-04 MED ORDER — MIDAZOLAM HCL 2 MG/2ML IJ SOLN
INTRAMUSCULAR | Status: AC
  Filled 2022-06-04: qty 2

## 2022-06-04 MED ORDER — THROMBIN 20000 UNITS EX SOLR
CUTANEOUS | Status: DC | PRN
  Administered 2022-06-04: 20 mL via TOPICAL

## 2022-06-04 MED ORDER — INSULIN REGULAR(HUMAN) IN NACL 100-0.9 UT/100ML-% IV SOLN
INTRAVENOUS | Status: DC | PRN
  Administered 2022-06-04: 6.5 [IU]/h via INTRAVENOUS

## 2022-06-04 MED ORDER — BUPIVACAINE HCL (PF) 0.5 % IJ SOLN
INTRAMUSCULAR | Status: AC
  Filled 2022-06-04: qty 30

## 2022-06-04 MED ORDER — PROPOFOL 10 MG/ML IV BOLUS
INTRAVENOUS | Status: DC | PRN
  Administered 2022-06-04: 20 ug/kg/min via INTRAVENOUS
  Administered 2022-06-04: 200 mg via INTRAVENOUS

## 2022-06-04 MED ORDER — EPHEDRINE 5 MG/ML INJ
INTRAVENOUS | Status: AC
  Filled 2022-06-04: qty 5

## 2022-06-04 MED ORDER — LACTATED RINGERS IV SOLN: INTRAVENOUS | Status: DC | PRN

## 2022-06-04 MED ORDER — BACITRACIN ZINC 500 UNIT/GM EX OINT
TOPICAL_OINTMENT | CUTANEOUS | Status: DC | PRN
  Administered 2022-06-04: 1 via TOPICAL

## 2022-06-04 MED ORDER — 0.9 % SODIUM CHLORIDE (POUR BTL) OPTIME
TOPICAL | Status: DC | PRN
  Administered 2022-06-04 (×2): 1000 mL

## 2022-06-04 MED ORDER — PHENYLEPHRINE HCL-NACL 20-0.9 MG/250ML-% IV SOLN: 0.0000 ug/min | INTRAVENOUS | Status: DC

## 2022-06-04 MED ORDER — SUGAMMADEX SODIUM 200 MG/2ML IV SOLN
INTRAVENOUS | Status: DC | PRN
  Administered 2022-06-04: 400 mg via INTRAVENOUS

## 2022-06-04 SURGICAL SUPPLY — 98 items
ADH SKN CLS APL DERMABOND .7 (GAUZE/BANDAGES/DRESSINGS) ×2
APL SKNCLS STERI-STRIP NONHPOA (GAUZE/BANDAGES/DRESSINGS) ×1
BAG COUNTER SPONGE SURGICOUNT (BAG) ×1 IMPLANT
BAG SPNG CNTER NS LX DISP (BAG) ×2
BAND INSRT 18 STRL LF DISP RB (MISCELLANEOUS) ×2
BAND RUBBER #18 3X1/16 STRL (MISCELLANEOUS) IMPLANT
BENZOIN TINCTURE PRP APPL 2/3 (GAUZE/BANDAGES/DRESSINGS) ×1 IMPLANT
BLADE BONE MILL MEDIUM (MISCELLANEOUS) IMPLANT
BLADE CLIPPER SURG (BLADE) ×1 IMPLANT
BLADE SURG 11 STRL SS (BLADE) IMPLANT
BLADE ULTRA TIP 2M (BLADE) IMPLANT
BONE CANC CHIPS 40CC CAN1/2 (Bone Implant) ×1 IMPLANT
BUR 14 MATCH 3 (BUR) IMPLANT
BUR MATCHSTICK NEURO 3.0 LAGG (BURR) ×1 IMPLANT
BUR MR8 14 BALL 5 (BUR) IMPLANT
BUR ROUND FLUTED 5 RND (BURR) IMPLANT
BURR 14 MATCH 3 (BUR)
BURR MR8 14 BALL 5 (BUR)
CAGE SABLE 10X22 11-17 22D (Cage) IMPLANT
CANISTER SUCT 3000ML PPV (MISCELLANEOUS) ×1 IMPLANT
CAP LOCKING (Cap) IMPLANT
CHIPS CANC BONE 40CC CAN1/2 (Bone Implant) ×1 IMPLANT
CONN ROD TO ROD 4X33-93 (Connector) ×1 IMPLANT
CONNECTOR ROD TO ROD 4X33-93 (Connector) IMPLANT
COVERAGE SUPPORT O-ARM STEALTH (MISCELLANEOUS) ×1 IMPLANT
DERMABOND ADVANCED .7 DNX12 (GAUZE/BANDAGES/DRESSINGS) IMPLANT
DRAPE C-ARM 42X72 X-RAY (DRAPES) ×2 IMPLANT
DRAPE LAPAROTOMY 100X72 PEDS (DRAPES) ×1 IMPLANT
DRAPE MICROSCOPE LEICA (MISCELLANEOUS) IMPLANT
DRAPE MICROSCOPE SLANT 54X150 (MISCELLANEOUS) IMPLANT
DRAPE SHEET LG 3/4 BI-LAMINATE (DRAPES) ×4 IMPLANT
DRAPE SURG 17X23 STRL (DRAPES) ×2 IMPLANT
DRSG OPSITE POSTOP 4X10 (GAUZE/BANDAGES/DRESSINGS) IMPLANT
ELECT BLADE 4.0 EZ CLEAN MEGAD (MISCELLANEOUS) ×1
ELECT CAUTERY BLADE 6.4 (BLADE) IMPLANT
ELECT REM PT RETURN 9FT ADLT (ELECTROSURGICAL) ×1
ELECTRODE BLDE 4.0 EZ CLN MEGD (MISCELLANEOUS) ×1 IMPLANT
ELECTRODE REM PT RTRN 9FT ADLT (ELECTROSURGICAL) ×1 IMPLANT
EVACUATOR 1/8 PVC DRAIN (DRAIN) IMPLANT
FEE COVERAGE SUPPORT O-ARM (MISCELLANEOUS) ×1 IMPLANT
GAUZE 4X4 16PLY ~~LOC~~+RFID DBL (SPONGE) IMPLANT
GAUZE SPONGE 4X4 12PLY STRL (GAUZE/BANDAGES/DRESSINGS) IMPLANT
GLOVE BIO SURGEON STRL SZ8.5 (GLOVE) ×1 IMPLANT
GLOVE BIOGEL PI IND STRL 7.0 (GLOVE) IMPLANT
GLOVE BIOGEL PI IND STRL 7.5 (GLOVE) IMPLANT
GLOVE ECLIPSE 6.5 STRL STRAW (GLOVE) IMPLANT
GLOVE EXAM NITRILE XL STR (GLOVE) IMPLANT
GLOVE SS BIOGEL STRL SZ 8 (GLOVE) ×1 IMPLANT
GLOVE SURG SS PI 6.5 STRL IVOR (GLOVE) IMPLANT
GLOVE SURG SS PI 7.0 STRL IVOR (GLOVE) IMPLANT
GLOVE SURG SS PI 7.5 STRL IVOR (GLOVE) IMPLANT
GOWN STRL REUS W/ TWL LRG LVL3 (GOWN DISPOSABLE) IMPLANT
GOWN STRL REUS W/ TWL XL LVL3 (GOWN DISPOSABLE) ×1 IMPLANT
GOWN STRL REUS W/TWL 2XL LVL3 (GOWN DISPOSABLE) IMPLANT
GOWN STRL REUS W/TWL LRG LVL3 (GOWN DISPOSABLE) ×2
GOWN STRL REUS W/TWL XL LVL3 (GOWN DISPOSABLE) ×4
GRAFT BNE CHIP CANC 1-8 40 (Bone Implant) IMPLANT
GRAFT DURAGEN MATRIX 1WX1L (Tissue) IMPLANT
HEMOSTAT POWDER KIT SURGIFOAM (HEMOSTASIS) IMPLANT
KIT BASIN OR (CUSTOM PROCEDURE TRAY) ×1 IMPLANT
KIT TURNOVER KIT B (KITS) ×1 IMPLANT
LOCKING CAP (Cap) ×6 IMPLANT
MARKER SPHERE PSV REFLC NDI (MISCELLANEOUS) ×5 IMPLANT
NDL SPNL 18GX3.5 QUINCKE PK (NEEDLE) IMPLANT
NEEDLE HYPO 22GX1.5 SAFETY (NEEDLE) ×1 IMPLANT
NEEDLE SPNL 18GX3.5 QUINCKE PK (NEEDLE) ×1 IMPLANT
NS IRRIG 1000ML POUR BTL (IV SOLUTION) ×1 IMPLANT
PACK LAMINECTOMY NEURO (CUSTOM PROCEDURE TRAY) ×1 IMPLANT
PAD ARMBOARD 7.5X6 YLW CONV (MISCELLANEOUS) ×3 IMPLANT
PATTIES SURGICAL .25X.25 (GAUZE/BANDAGES/DRESSINGS) IMPLANT
PATTIES SURGICAL .5 X.5 (GAUZE/BANDAGES/DRESSINGS) IMPLANT
PATTIES SURGICAL .5 X1 (DISPOSABLE) IMPLANT
PATTIES SURGICAL .75X.75 (GAUZE/BANDAGES/DRESSINGS) IMPLANT
PIN MAYFIELD SKULL DISP (PIN) ×1 IMPLANT
ROD STRAIGHT 120MMX4MM (Rod) IMPLANT
SCREW PA QUARTEX 5X34 (Screw) IMPLANT
SCREW PA QUARTEX 5X40 (Screw) IMPLANT
SPIKE FLUID TRANSFER (MISCELLANEOUS) ×1 IMPLANT
SPONGE NEURO XRAY DETECT 1X3 (DISPOSABLE) IMPLANT
SPONGE SURGIFOAM ABS GEL 100 (HEMOSTASIS) ×1 IMPLANT
SPONGE T-LAP 4X18 ~~LOC~~+RFID (SPONGE) IMPLANT
STAPLER SKIN PROX WIDE 3.9 (STAPLE) IMPLANT
STRIP CLOSURE SKIN 1/2X4 (GAUZE/BANDAGES/DRESSINGS) ×1 IMPLANT
SUT ETHILON 2 0 FS 18 (SUTURE) IMPLANT
SUT VIC AB 0 CT1 18XCR BRD8 (SUTURE) ×1 IMPLANT
SUT VIC AB 0 CT1 8-18 (SUTURE) ×2
SUT VIC AB 2-0 CP2 18 (SUTURE) ×1 IMPLANT
SUT VIC AB 2-0 CT1 18 (SUTURE) IMPLANT
SUT VIC AB 2-0 CT2 18 VCP726D (SUTURE) IMPLANT
SUT VIC AB 3-0 SH 8-18 (SUTURE) IMPLANT
TAPE CLOTH 3X10 TAN LF (GAUZE/BANDAGES/DRESSINGS) ×1 IMPLANT
TIP CART INSTAFILL GL 5CC (ORTHOPEDIC DISPOSABLE SUPPLIES) IMPLANT
TOWEL GREEN STERILE (TOWEL DISPOSABLE) ×1 IMPLANT
TOWEL GREEN STERILE FF (TOWEL DISPOSABLE) ×1 IMPLANT
TRAY FOL W/BAG SLVR 16FR STRL (SET/KITS/TRAYS/PACK) IMPLANT
TRAY FOLEY MTR SLVR 16FR STAT (SET/KITS/TRAYS/PACK) IMPLANT
TRAY FOLEY W/BAG SLVR 16FR LF (SET/KITS/TRAYS/PACK) ×1
WATER STERILE IRR 1000ML POUR (IV SOLUTION) ×1 IMPLANT

## 2022-06-04 NOTE — Op Note (Signed)
06/04/2022  6:32 PM  PATIENT:  Travis [name]  72 y.o. male  PRE-OPERATIVE DIAGNOSIS:  Thoracic Two Pathological fracture  POST-OPERATIVE DIAGNOSIS:  Thoracic two Pathological fracture  PROCEDURE:  Procedure(s): Thoracic one - Thoracic Four Posterior lateral arthrodesis/ fusion and Thoracic two Corpectomy for tumor resection, extradural. Interbody Sabre cage placed, allograft morsels in the corpectomy site Application of O-Arm Segmental pedicle screw fixation T1-T4 Globus SURGEON: Surgeon(s): Ashok Pall, MD Kristeen Miss, MD  ASSISTANTS:Elsner, Mallie Mussel  ANESTHESIA:   general  EBL:  Total I/O In: 1308 [I.V.:3600; Blood:250; IV Piggyback:600] Out: 1625 [Urine:425; Blood:1200]  BLOOD ADMINISTERED: 500 CC PRBC  CELL SAVER GIVEN:none used  COUNT:per nursing  DRAINS: (Medium) Hemovact drain(s) in the subascial plane with  Suction Open   SPECIMEN:  Source of Specimen:  T2 vertebral body  DICTATION: Travis [name] was taken to the operating room, intubated, and placed under a general anesthetic without difficulty. A foley catheter was placed under sterile conditions. Once adequate anesthesia was obtained I placed a Mayfield head holder. He was positioned prone on body rolls with all pressure points properly padded. His head was secured with the Mayfield adapter.  His thoracic region was prepped and draped in a sterile manner.  I infiltrated lidocaine into the planned incision. I opened the skin with a 10 blade and carried the dissection sharply to the Thoracolumbar fascia. I exposed the lamina of T1,2,3,4 bilaterally using monopolar cautery. I with fluoroscopic guidance, performed a laminectomy of T2 and T3 exposing the thecal sac. I used the drill to create more exposure laterally skeletonizing the T2 nerve root on the right side. With the lateral exposure we were able to perform the corpectomy using rongeurs, various curettes, and punches to remove the tumor. Once accomplished we  placed the hardware. In the corpectomy site we used a single expandable Saber cage, and packed the cavity with allograft morsels. To complete the interbody arthrodesis.  We decorticated the lateral margins of the bony exposure and placed allograft morsels on the decorticated bone to complete the lateral arthrodesis.  Pedicle screws were placed at T1,3, and 4 bilaterally. We used spinal navigation to aid in screw placement.  We checked the screw placement with the Oarm and were satisfied with their location.  We connected the screw heads with rods and secured them with locking nuts.  A dural rent in the corner of the T2 nerve root and thecal sac was repaired indirectly with duragen placement.  A medium hemovac drain was placed in the subfascial plane and brought out through the skin attached to suction.  I closed the wound by approximating the thoracolumbar fascia, the subcutaneous, and subcuticular planes with vicryl suture. I used dermabond and an occlusive bandage for a sterile dressing. PLAN OF CARE: Admit to inpatient   PATIENT DISPOSITION:  PACU - hemodynamically stable.   Delay start of Pharmacological VTE agent (>24hrs) due to surgical blood loss or risk of bleeding:  no

## 2022-06-04 NOTE — Transfer of Care (Signed)
Immediate Anesthesia Transfer of Care Note  Patient: Travis [name]  Procedure(s) Performed: Thoracic one - Thoracic Four Posterior lateral arthrodesis/ fusion and Thoracic two Corpectomy Application of O-Arm  Patient Location: PACU  Anesthesia Type:General  Level of Consciousness: drowsy and patient cooperative  Airway & Oxygen Therapy: Patient connected to face mask oxygen  Post-op Assessment: Report given to RN and Post -op Vital signs reviewed and stable  Post vital signs: Reviewed and stable  Last Vitals:  Vitals Value Taken Time  BP 87/57 06/04/22 1520  Temp    Pulse 53 06/04/22 1524  Resp 14 06/04/22 1524  SpO2 99 % 06/04/22 1524  Vitals shown include unvalidated device data.  Last Pain:  Vitals:   06/04/22 0420  TempSrc: Oral  PainSc:       Patients Stated Pain Goal: 1 (21/58/72 7618)  Complications: There were no known notable events for this encounter.

## 2022-06-04 NOTE — Progress Notes (Signed)
Patient ID: Travis [name], male   DOB: 03-31-1951, 72 y.o.   MRN: [id number] BP (!) 153/83 (BP Location: Left Arm)   Pulse (!) 57   Temp 97.8 F (36.6 C) (Oral)   Resp 18   Ht '5\' 10"'$  (1.778 m)   Wt 110.9 kg   SpO2 97%   BMI 35.08 kg/m  Alert and oriented x4, speech is clear and fluent Moving all extremities OR for T2 fracture, posterior cervico thoracic arthrodesis

## 2022-06-04 NOTE — Anesthesia Preprocedure Evaluation (Signed)
Anesthesia Evaluation  Patient identified by MRN, date of birth, ID band Patient awake    Reviewed: Allergy & Precautions, NPO status , Patient's Chart, lab work & pertinent test results, reviewed documented beta blocker date and time   History of Anesthesia Complications Negative for: history of anesthetic complications  Airway Mallampati: III  TM Distance: >3 FB Neck ROM: Full    Dental no notable dental hx. (+) Dental Advisory Given   Pulmonary sleep apnea and Continuous Positive Airway Pressure Ventilation    Pulmonary exam normal        Cardiovascular hypertension, Pt. on medications and Pt. on home beta blockers + DVT (LLE)  Normal cardiovascular exam+ dysrhythmias (on Eliquis) Atrial Fibrillation   IMPRESSIONS     1. Global longitudinal strain is -20.7%. Left ventricular ejection  fraction, by estimation, is 60 to 65%. The left ventricle has normal  function. The left ventricle has no regional wall motion abnormalities.  Left ventricular diastolic parameters were  normal.   2. Right ventricular systolic function is normal. The right ventricular  size is normal.   3. The mitral valve is normal in structure. Mild mitral valve  regurgitation.   4. The aortic valve is normal in structure. Aortic valve regurgitation is  not visualized.   5. The inferior vena cava is normal in size with greater than 50%  respiratory variability, suggesting right atrial pressure of 3 mmHg.      Neuro/Psych Pathologic fracture  negative psych ROS   GI/Hepatic Neg liver ROS, hiatal hernia,GERD  Controlled,,  Endo/Other  negative endocrine ROS    Renal/GU negative Renal ROS  negative genitourinary   Musculoskeletal  (+) Arthritis ,    Abdominal   Peds  Hematology multiple myeloma   Anesthesia Other Findings Day of surgery medications reviewed with patient.  Reproductive/Obstetrics negative OB ROS                              Anesthesia Physical Anesthesia Plan  ASA: 4  Anesthesia Plan: General   Post-op Pain Management: Tylenol PO (pre-op)* and Toradol IV (intra-op)*   Induction: Intravenous  PONV Risk Score and Plan: 3 and Treatment may vary due to age or medical condition, Ondansetron, Dexamethasone and Midazolam  Airway Management Planned: Oral ETT  Additional Equipment: Arterial line  Intra-op Plan:   Post-operative Plan: Extubation in OR  Informed Consent: I have reviewed the patients History and Physical, chart, labs and discussed the procedure including the risks, benefits and alternatives for the proposed anesthesia with the patient or authorized representative who has indicated his/her understanding and acceptance.       Plan Discussed with: Anesthesiologist, CRNA and Surgeon  Anesthesia Plan Comments:         Anesthesia Quick Evaluation

## 2022-06-04 NOTE — Anesthesia Procedure Notes (Addendum)
Arterial Line Insertion Start/End2/06/2022 8:15 AM, 06/04/2022 8:20 AM Performed by: Kyung Rudd, CRNA, CRNA  Patient location: OR. Preanesthetic checklist: patient identified, IV checked, site marked, risks and benefits discussed, surgical consent, monitors and equipment checked, pre-op evaluation, timeout performed and anesthesia consent Lidocaine 1% used for infiltration Right, radial was placed Catheter size: 20 G Hand hygiene performed  and maximum sterile barriers used   Attempts: 1 Procedure performed without using ultrasound guided technique. Following insertion, dressing applied. Post procedure assessment: normal and unchanged  Patient tolerated the procedure well with no immediate complications.

## 2022-06-04 NOTE — OR Nursing (Signed)
Blood bank phoned to room Dr Christella Noa made aware of blood product and medicine patient has been on stated ,"send blood and he is aware."

## 2022-06-04 NOTE — Anesthesia Procedure Notes (Signed)
Procedure Name: Intubation Date/Time: 06/04/2022 8:14 AM  Performed by: Ester Rink, CRNAPre-anesthesia Checklist: Patient identified, Emergency Drugs available, Suction available and Patient being monitored Patient Re-evaluated:Patient Re-evaluated prior to induction Oxygen Delivery Method: Circle system utilized Preoxygenation: Pre-oxygenation with 100% oxygen Induction Type: IV induction Ventilation: Mask ventilation without difficulty Laryngoscope Size: Glidescope and 4 Grade View: Grade I Tube type: Oral Tube size: 7.5 mm Number of attempts: 1 Airway Equipment and Method: Stylet and Oral airway Placement Confirmation: ETT inserted through vocal cords under direct vision, positive ETCO2 and breath sounds checked- equal and bilateral Secured at: 23 cm Tube secured with: Tape Dental Injury: Teeth and Oropharynx as per pre-operative assessment

## 2022-06-04 NOTE — Progress Notes (Addendum)
Patient has been in [place] since early this morning. TRH will follow-up  Continue as per Dr. Christella Noa likely going to ICU post op.

## 2022-06-04 NOTE — Progress Notes (Signed)
PROGRESS NOTE Travis [name]  TFT:[id number] DOB: 06-Sep-1950 DOA: 05/28/2022 PCP: [name], MD   Brief Narrative/Hospital Course: 35-yom w/ OSA on CPAP, HTN, PAF on chronic anticoagulation, history of DVT and multiple myeloma- diagnosed in 2018 and smoldering MM, and in July 2023, became more aggressive and had a lesion in his right humerus that had to be orthopedically repaired and since last month, he had aching pain in his upper back that has progressively gotten worse in the last few weeks. PT did not help much.   He was evaluated by sports medicine and had an MRI of the C-spine which showed interval progression of multiple myeloma in the thoracic spine compared with previous CT chest 10 weeks prior and new pathology go fracture at T2 with significant osseous retropulsion and resulting cord compression.Additional smaller lesions within the T1 vertebral body and right C7 and T3 articulating facets. He was called by his oncologist, Dr. Alvy Bimler to be hospitalized for emergent radiation therapy.  Currently on chemotherapy every 3 weeks Radiation oncology, neurosurgery consulted> with plan for OR for spinal stabilization and tumor resection 06/04/22.  He had CT thoracic spine with contrast suboptimal but showed extensive tumor replacement of the vertebral body T12 with collapse and paravertebral/spinal canal mass. Pt is s/p 1 unit PRBC during OR.  2/2 Post op moving to ICU   Subjective: Seen in [place] in place- BP in 101/61, on RA   Assessment and Plan: Principal Problem:   Pathologic compression fracture of spine, initial encounter [facility]) Active Problems:   Hypertension   Atrial fibrillation (HCC)   OSA (obstructive sleep apnea)   History of DVT (deep vein thrombosis)   Multiple myeloma (HCC)   Cord compression (HCC)   Other constipation   Vitamin D deficiency   Pathological compression fracture of T12 vertebra at risk of cord compression: Underwent  MRI thoracic spine, LS spine (see report for detail)-seen by radiation oncology underwent emergent XRT x1,  10/27, seen by neurosurgery Dr Larose Hires: s/p spinal stabilization and tumor resection 06/04/22. He had CT thoracic spine with contrast suboptimal but showed extensive tumor replacement of the vertebral body T12 with collapse and paravertebral/spinal canal mass. Post op being moved to ICU under neurosurgery service>continue pain control, bowel regimen, Decadron per neurosx  Multiple myeloma: Seen by oncology holding Revlimid while hospitalized with plan to follow-up with Dr. Lorenso Courier as outpatient. Hypertension: BP soft post op- he is normally on lisinopril Norvasc and hydralazine-cont with cautions Paroxysmal atrial fibrillation continue amiodarone. His Eliquis is on hold for surgery>resume post op per neurosx. OSA continue CPAP nightly. Constipation continue scheduled laxative MiraLAX and monitor History of DVT on Eliquis  PTA>currently on hold see above Hyperglycemia:Likely due to steroids, continue SSI.  Cbg in 131 Class I Obesity:Patient's Body mass index is 35.08 kg/m. : Will benefit with PCP follow-up, weight loss  healthy lifestyle and outpatient sleep evaluation.  DVT prophylaxis: SCD's Start: 06/03/22 0918 heparin injection 5,000 Units Start: 06/01/22 2200 SCDs Start: 05/29/22 0348 Code Status:   Code Status: Full Code Family Communication: plan of care discussed with patient 's RN and Dr Arneta Cliche Patient status is: inpatient  because of vertebrae fracture  Level of care: ICU under Dr Malachy Moan Service- discussed with him. Dispo: The patient is from: home w/ wife            Anticipated disposition: TBD Objective: Vitals last 24 hrs: Vitals:   06/04/22 0002 06/04/22 0420 06/04/22 1521 06/04/22 1530  BP: 137/73 [name])  153/83 (!) 87/57 (!) 78/58  Pulse: (!) 58 (!) 57 (!) 54 (!) 52  Resp: '16 18 14 13  '$ Temp: 98.4 F (36.9 C) 97.8 F (36.6 C) 97.9 F (36.6 C)   TempSrc: Oral Oral     SpO2: 95% 97% 99% 99%  Weight:      Height:       Weight change:   Physical Examination: General exam: drowsy, obnese, on RA HEENT:Oral mucosa DRY, Ear/Nose WNL grossly, dentition normal. Respiratory system: bilaterally diminished BS, no use of accessory muscle Cardiovascular system: S1 & S2 +, regular rate. Gastrointestinal system: Abdomen soft, obese, NT,ND,BS+ Nervous System:drowsy Extremities: LE ankle edema neg, lower extremities warm Skin: No rashes,no icterus. MSK: Normal muscle bulk,tone, power   Medications reviewed:  Scheduled Meds:  [MAR Hold] acyclovir  400 mg Oral BID   [MAR Hold] amiodarone  200 mg Oral Daily   [MAR Hold] amLODipine  10 mg Oral Daily   Chlorhexidine Gluconate Cloth  6 each Topical Once   [MAR Hold] cholecalciferol  2,000 Units Oral Daily   [MAR Hold] dexamethasone (DECADRON) injection  4 mg Intravenous S5K   [MAR Hold] folic acid  1 mg Oral Daily   [MAR Hold] gabapentin  600 mg Oral BID   [MAR Hold] heparin injection (subcutaneous)  5,000 Units Subcutaneous Q8H   [MAR Hold] hydrALAZINE  37.5 mg Oral TID   [MAR Hold] insulin aspart  0-9 Units Subcutaneous TID WC   [MAR Hold] lisinopril  40 mg Oral Daily   [MAR Hold] polyethylene glycol  17 g Oral Daily   [MAR Hold] senna-docusate  2 tablet Oral BID   Continuous Infusions:  sodium chloride        Diet Order             Diet NPO time specified  Diet effective midnight                   Intake/Output Summary (Last 24 hours) at 06/04/2022 1555 Last data filed at 06/04/2022 1425 Gross per 24 hour  Intake 4350 ml  Output 1500 ml  Net 2850 ml    Net IO Since Admission: 4,560 mL [06/04/22 1555]  Wt Readings from Last 3 Encounters:  05/29/22 110.9 kg  05/18/22 111.8 kg  05/13/22 112.9 kg     Unresulted Labs (From admission, onward)    None     Data Reviewed: I have personally reviewed following labs and imaging studies CBC: Recent Labs  Lab 05/29/22 0444 05/31/22 0340  06/01/22 0418 06/02/22 0441  WBC 6.5 10.4 10.6* 9.9  NEUTROABS 6.1  --   --   --   HGB 13.9 13.6 13.5 13.5  HCT 40.8 39.4 39.6 39.7  MCV 96.7 93.8 95.2 93.9  PLT 181 176 161 539    Basic Metabolic Panel: Recent Labs  Lab 05/29/22 0444 05/30/22 0422 05/31/22 0340 06/01/22 0418 06/02/22 0441 06/03/22 0332  NA 136 137 135 137 136 137  K 3.9 3.6 3.5 3.6 3.7 3.6  CL 103 104 102 106 102 105  CO2 '23 23 24 24 23 23  '$ GLUCOSE 152* 148* 156* 157* 145* 144*  BUN '13 22 22 '$ 25* 24* 22  CREATININE 0.80 0.78 0.86 0.81 0.77 0.80  CALCIUM 8.2* 8.3* 7.9* 8.2* 8.0* 7.8*  MG 2.3  --   --   --   --   --     GFR: Estimated Creatinine Clearance: 105.7 mL/min (by C-G formula based on SCr of 0.8 mg/dL).  Liver Function Tests: No results for input(s): "AST", "ALT", "ALKPHOS", "BILITOT", "PROT", "ALBUMIN" in the last 168 hours.  Recent Labs    11/17/21 1513  PROBNP 232    HbA1C: Recent Labs    06/02/22 0441  HGBA1C 5.5    CBG: Recent Labs  Lab 06/04/22 1132 06/04/22 1244 06/04/22 1308 06/04/22 1415 06/04/22 1521  GLUCAP 158* 142* 126* 139* 131*    Recent Results (from the past 240 hour(s))  Surgical pcr screen     Status: None   Collection Time: 06/03/22  9:20 AM   Specimen: Nasal Mucosa; Nasal Swab  Result Value Ref Range Status   MRSA, PCR NEGATIVE NEGATIVE Final   Staphylococcus aureus NEGATIVE NEGATIVE Final    Comment: (NOTE) The Xpert SA Assay (FDA approved for NASAL specimens in patients 41 years of age and older), is one component of a comprehensive surveillance program. It is not intended to diagnose infection nor to guide or monitor treatment. Performed at [facility], [place] [address], [place], [place] [address]     Antimicrobials: Anti-infectives (From admission, onward)    Start     Dose/Rate Route Frequency Ordered Stop   06/03/22 1300  ceFAZolin (ANCEF) IVPB 2g/100 mL premix        2 g 200 mL/hr over 30 Minutes Intravenous On call to O.R.  06/03/22 0917 06/04/22 0559   05/28/22 2345  [MAR Hold]  acyclovir (ZOVIRAX) tablet 400 mg        (MAR Hold since Fri 06/04/2022 at 0727.Hold Reason: Transfer to a Procedural area)   400 mg Oral 2 times daily 05/28/22 2333        Culture/Microbiology    Component Value Date/Time   SDES  10/13/2020 1355    URINE, RANDOM Performed at [facility], [place] [address], [place], [place] [address]    SPECREQUEST  10/13/2020 1355    NONE Performed at [facility], [place] [address], [place], [place] [address]    CULT  10/13/2020 1355    NO GROWTH Performed at [place] [address], [place],  [address]    REPTSTATUS 10/14/2020 FINAL 10/13/2020 1355   Other culture-see note  Radiology Studies: DG Cervical Spine 2 or 3 views  Result Date: 06/04/2022 CLINICAL DATA:  Elective surgery EXAM: CERVICAL SPINE - 2-3 VIEW COMPARISON:  MRI cervical spine 05/27/2022. Cervical spine x-ray 05/13/2022 FINDINGS: Two views of the cervical spine were submitted, intraoperative. No hardware identified. Fluoroscopy time 15 seconds. Dose: 1.76 micro Gy. IMPRESSION: Intraoperative cervical spine as above. Electronically Signed   By: Ronney Asters M.D.   On: 06/04/2022 15:25   DG C-Arm 1-60 Min-No Report  Result Date: 06/04/2022 Fluoroscopy was utilized by the requesting physician.  No radiographic interpretation.   DG O-ARM IMAGE ONLY/NO REPORT  Result Date: 06/04/2022 There is no Radiologist interpretation  for this exam.  CT THORACIC SPINE W CONTRAST  Result Date: 06/04/2022 CLINICAL DATA:  Metastatic disease.  Preoperative assessment EXAM: CT THORACIC SPINE WITH CONTRAST TECHNIQUE: Multidetector CT images of thoracic was performed according to the standard protocol following intravenous contrast administration. RADIATION DOSE REDUCTION: This exam was performed according to the departmental dose-optimization program which includes automated exposure control,  adjustment of the mA and/or kV according to patient size and/or use of iterative reconstruction technique. CONTRAST:  89m OMNIPAQUE IOHEXOL 350 MG/ML SOLN COMPARISON:  05/29/2022 FINDINGS: Alignment: Exaggerated thoracic kyphosis. Vertebrae: Suboptimal coverage especially considering level of primary concern. Partially covered  T2 vertebra with known tumoral erosion of the vertebral body with minimal remaining inferior endplate visible. Compression fracture of this vertebra with advanced narrowing. Diffuse heterogeneity of marrow with tumor extent better assessed on prior MRI, most notable deposits in the T8 body, T11 spinous process, and L1 left pedicle. Additional rib lesions on the right at T6 and T8 primarily. Paraspinal and other soft tissues: Paravertebral tumor eccentric to the left at T2. Disc levels: Ordinary thoracic spondylosis. Request was made for additional imaging but declined due to pending surgery IMPRESSION: 1. Suboptimal exam with only partial coverage of the T2 level where there is extensive tumor replacement of the vertebral body with collapse and paravertebral/spinal canal mass. 2. Additional lesions also seen on recent MRI. Electronically Signed   By: Jorje Guild M.D.   On: 06/04/2022 07:44     LOS: 6 days   Antonieta Pert, MD [facility]  06/04/2022, 3:55 PM

## 2022-06-05 ENCOUNTER — Ambulatory Visit: Payer: Medicare Other | Admitting: Radiation Oncology

## 2022-06-05 DIAGNOSIS — C9000 Multiple myeloma not having achieved remission: Secondary | ICD-10-CM | POA: Diagnosis not present

## 2022-06-05 DIAGNOSIS — I4891 Unspecified atrial fibrillation: Secondary | ICD-10-CM | POA: Diagnosis not present

## 2022-06-05 DIAGNOSIS — M4850XA Collapsed vertebra, not elsewhere classified, site unspecified, initial encounter for fracture: Secondary | ICD-10-CM | POA: Diagnosis not present

## 2022-06-05 LAB — BASIC METABOLIC PANEL
Anion gap: 8 (ref 5–15)
BUN: 25 mg/dL — ABNORMAL HIGH (ref 8–23)
CO2: 21 mmol/L — ABNORMAL LOW (ref 22–32)
Calcium: 6.9 mg/dL — ABNORMAL LOW (ref 8.9–10.3)
Chloride: 108 mmol/L (ref 98–111)
Creatinine, Ser: 0.88 mg/dL (ref 0.61–1.24)
GFR, Estimated: >60 mL/min (ref >60)
Glucose, Bld: 155 mg/dL — ABNORMAL HIGH (ref 70–99)
Potassium: 3.9 mmol/L (ref 3.5–5.1)
Sodium: 137 mmol/L (ref 135–145)

## 2022-06-05 LAB — GLUCOSE, CAPILLARY
Glucose-Capillary: 140 mg/dL — ABNORMAL HIGH (ref 70–99)
Glucose-Capillary: 128 mg/dL — ABNORMAL HIGH (ref 70–99)
Glucose-Capillary: 140 mg/dL — ABNORMAL HIGH (ref 70–99)
Glucose-Capillary: 171 mg/dL — ABNORMAL HIGH (ref 70–99)

## 2022-06-05 LAB — CBC
HCT: 28 % — ABNORMAL LOW (ref 39.0–52.0)
Hemoglobin: 9.5 g/dL — ABNORMAL LOW (ref 13.0–17.0)
MCH: 32 pg (ref 26.0–34.0)
MCHC: 33.9 g/dL (ref 30.0–36.0)
MCV: 94.3 fL (ref 80.0–100.0)
Platelets: 84 10*3/uL — ABNORMAL LOW (ref 150–400)
RBC: 2.97 MIL/uL — ABNORMAL LOW (ref 4.22–5.81)
RDW: 14.8 % (ref 11.5–15.5)
WBC: 12.4 10*3/uL — ABNORMAL HIGH (ref 4.0–10.5)
nRBC: 0 % (ref 0.0–0.2)

## 2022-06-05 MED ORDER — CALCIUM GLUCONATE-NACL 1-0.675 GM/50ML-% IV SOLN
1.0000 g | Freq: Once | INTRAVENOUS | Status: AC
  Administered 2022-06-05: 1000 mg via INTRAVENOUS
  Filled 2022-06-05: qty 50

## 2022-06-05 MED ORDER — METHOCARBAMOL 500 MG PO TABS
500.0000 mg | ORAL_TABLET | Freq: Four times a day (QID) | ORAL | Status: DC | PRN
  Administered 2022-06-05 – 2022-06-09 (×5): 500 mg via ORAL
  Filled 2022-06-05 (×6): qty 1

## 2022-06-05 MED ORDER — KETOROLAC TROMETHAMINE 15 MG/ML IJ SOLN
15.0000 mg | Freq: Three times a day (TID) | INTRAMUSCULAR | Status: DC
  Administered 2022-06-05 – 2022-06-09 (×11): 15 mg via INTRAVENOUS
  Filled 2022-06-05 (×12): qty 1

## 2022-06-05 NOTE — Progress Notes (Addendum)
Patient ID: Travis [name], male   DOB: 10-Sep-1950, 72 y.o.   MRN: [id number] Day 1 status post decompression of T2 myeloma of vertebral body with reconstruction T1-T4. Vital signs are stable Patient's postoperative labs shows hemoglobin is 9.5. Electrolytes are otherwise stable Clinically the patient is feeling better Motor function appears to be intact in the upper and lower extremities Drainage output was 450 cc yesterday 150 cc thus far today Will leave drain in place for now Will continue to mobilize with physical therapy today keep in the intensive care unit Will add some Toradol for pain control for the next several days. I spoke with the patient and the patient's wife by phone in addition to his work associate who was present.  Questions were answered.

## 2022-06-05 NOTE — Evaluation (Signed)
Physical Therapy Evaluation Patient Details Name: Travis [name] MRN: [id number] DOB: 05/24/1950 Today's Date: 06/05/2022  History of Present Illness  Pt is 72 yo male who presents on 06/04/22 for T1-4 PLIF with tumor resection at T2. PMH: OSA on CPAP, HTN, PAF on chronic anticoagulation, history of DVT and multiple myeloma- diagnosed in 2018 and in July 2023, became more aggressive and had a lesion in his right humerus that had to be orthopedically repaired and since last month, he had aching pain in his upper back that has progressively gotten worse  Clinical Impression  Pt admitted with above diagnosis. Pt from home with wife. He was been working minimally and driving up until recently. He uses adaptive equipment to compensate with ADL's since shoulder procedure but feels he was just recovering from that when back discomfort began. He also endorses some balance issues. Pt needed min A for mobility on eval. Ambulated 24' with RW, felt mildly lightheaded but VSS. Recommend HHPT at d/c.  Pt currently with functional limitations due to the deficits listed below (see PT Problem List). Pt will benefit from skilled PT to increase their independence and safety with mobility to allow discharge to the venue listed below.          Recommendations for follow up therapy are one component of a multi-disciplinary discharge planning process, led by the attending physician.  Recommendations may be updated based on patient status, additional functional criteria and insurance authorization.  Follow Up Recommendations Home health PT      Assistance Recommended at Discharge Intermittent Supervision/Assistance  Patient can return home with the following  A lot of help with walking and/or transfers;A little help with bathing/dressing/bathroom;Assistance with cooking/housework;Assist for transportation;Help with stairs or ramp for entrance    Equipment Recommendations None recommended by PT  Recommendations for  Other Services  OT consult    Functional Status Assessment Patient has had a recent decline in their functional status and demonstrates the ability to make significant improvements in function in a reasonable and predictable amount of time.     Precautions / Restrictions Precautions Precautions: Fall;Cervical Precaution Booklet Issued: Yes (comment) Precaution Comments: education given Restrictions Weight Bearing Restrictions: No      Mobility  Bed Mobility Overal bed mobility: Needs Assistance Bed Mobility: Rolling, Sidelying to Sit Rolling: Min assist Sidelying to sit: Mod assist       General bed mobility comments: vc's for precautions. Min A to LE's for rolling. Mod A for elevation of trunk into sitting    Transfers Overall transfer level: Needs assistance Equipment used: Rolling walker (2 wheels), None Transfers: Sit to/from Stand, Bed to chair/wheelchair/BSC Sit to Stand: Min assist   Step pivot transfers: Min assist       General transfer comment: min HHA to pivot to recliner    Ambulation/Gait Ambulation/Gait assistance: Min assist Gait Distance (Feet): 24 Feet Assistive device: Rolling walker (2 wheels) Gait Pattern/deviations: Step-through pattern, Decreased stride length Gait velocity: decreased Gait velocity interpretation: <1.8 ft/sec, indicate of risk for recurrent falls   General Gait Details: RW used for stability. Pt light headed at 50' so turned around back to chair. BP stable.  Stairs            Wheelchair Mobility    Modified Rankin (Stroke Patients Only)       Balance Overall balance assessment: Needs assistance Sitting-balance support: No upper extremity supported, Feet supported Sitting balance-Leahy Scale: Fair Sitting balance - Comments: guarded   Standing balance support: Bilateral  upper extremity supported, During functional activity Standing balance-Leahy Scale: Poor Standing balance comment: recommend UE support for  safety at this time                             Pertinent Vitals/Pain Pain Assessment Pain Assessment: Faces Faces Pain Scale: Hurts even more Pain Location: upper back/ shoulder blades Pain Descriptors / Indicators: Sore, Guarding, Grimacing Pain Intervention(s): Limited activity within patient's tolerance, Monitored during session    Home Living Family/patient expects to be discharged to:: Private residence Living Arrangements: Spouse/significant other Available Help at Discharge: Family Type of Home: House Home Access: Stairs to enter Entrance Stairs-Rails: Right Entrance Stairs-Number of Steps: 4 Alternate Level Stairs-Number of Steps: flight Home Layout: Two level;Able to live on main level with bedroom/bathroom Home Equipment: Rolling Walker (2 wheels);Cane - single point;Shower seat - built in;Adaptive equipment;Hand held shower head      Prior Function Prior Level of Function : Working/employed;Driving;Needs assist             Mobility Comments: working very minimally Biochemist, clinical), 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 ambulating without AD but reports balance has been off ADLs Comments: uses adaptive equipment     Hand Dominance   Dominant Hand: Right    Extremity/Trunk Assessment   Upper Extremity Assessment Upper Extremity Assessment: Defer to OT evaluation;RUE deficits/detail RUE Deficits / Details: recent R shoulder surgery    Lower Extremity Assessment Lower Extremity Assessment: RLE deficits/detail;LLE deficits/detail RLE Deficits / Details: THA x3, strength grossly 4/5 RLE Sensation: history of peripheral neuropathy RLE Coordination: decreased gross motor LLE Deficits / Details: has thoracic pain with mvmt of LLE so strength grossly 3+/5 due to pain LLE Sensation: history of peripheral neuropathy LLE Coordination: decreased gross motor    Cervical / Trunk Assessment Cervical / Trunk Assessment: Kyphotic  Communication   Communication: No  difficulties  Cognition Arousal/Alertness: Awake/alert Behavior During Therapy: WFL for tasks assessed/performed Overall Cognitive Status: Within Functional Limits for tasks assessed                                          General Comments General comments (skin integrity, edema, etc.): VSS.    Exercises     Assessment/Plan    PT Assessment Patient needs continued PT services  PT Problem List Decreased strength;Decreased activity tolerance;Decreased balance;Decreased mobility;Decreased coordination;Decreased knowledge of precautions;Pain       PT Treatment Interventions DME instruction;Gait training;Stair training;Functional mobility training;Therapeutic activities;Therapeutic exercise;Balance training;Neuromuscular re-education;Patient/family education    PT Goals (Current goals can be found in the Care Plan section)  Acute Rehab PT Goals Patient Stated Goal: return home and he would really like to be able to golf PT Goal Formulation: With patient Time For Goal Achievement: 06/19/22 Potential to Achieve Goals: Good    Frequency Min 5X/week     Co-evaluation               AM-PAC PT "6 Clicks" Mobility  Outcome Measure Help needed turning from your back to your side while in a flat bed without using bedrails?: A Little Help needed moving from lying on your back to sitting on the side of a flat bed without using bedrails?: A Lot Help needed moving to and from a bed to a chair (including a wheelchair)?: A Little Help needed standing up from a chair using your arms (  e.g., wheelchair or bedside chair)?: A Little Help needed to walk in hospital room?: A Little Help needed climbing 3-5 steps with a railing? : A Lot 6 Click Score: 16    End of Session Equipment Utilized During Treatment: Gait belt Activity Tolerance: Patient tolerated treatment well Patient left: in chair;with call bell/phone within reach Nurse Communication: Mobility status PT Visit  Diagnosis: Unsteadiness on feet (R26.81);Muscle weakness (generalized) (M62.81);Difficulty in walking, not elsewhere classified (R26.2);Pain Pain - part of body:  (upper back)    Time: 1448-1530 PT Time Calculation (min) (ACUTE ONLY): 42 min   Charges:   PT Evaluation $PT Eval Moderate Complexity: 1 Mod PT Treatments $Gait Training: 8-22 mins $Therapeutic Activity: 8-22 mins        Leighton Roach, PT  Acute Rehab Services Secure chat preferred Office [place] 06/05/2022, 4:04 PM

## 2022-06-05 NOTE — Progress Notes (Signed)
Pt has home CPAP at bedside and already on upon my arrival.

## 2022-06-05 NOTE — Progress Notes (Signed)
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  Because he had constant pain, MRI of C-spine showed progression of multiple myeloma, and the thoracic spine with fracture at T2 with significant osseous retropulsion resulting in cord compression. Underwent emergent radiation treatment per oncology.  Getting chemotherapy every 3 weeks.  In addition oncology and neurosurgery were consulted.  Went to  OR for spinal stabilization and tumor resection on 2/24.    Subjective   Patient seen and examined, s/p surgery.  Complains of  pain on movement.   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 Lorenso Courier as outpatient  Hypertension -Continue amlodipine, hydralazine, lisinopril  Paroxysmal atrial fibrillation -Continue amiodarone -Eliquis on hold for surgery -Eliquis to be resumed as per neurosurgery recommendation  Obstructive sleep apnea -Continue CPAP nightly  Constipation -Continue MiraLAX  History of DVT -He was on Eliquis prior to  admission -Eliquis on hold as above  Hyperglycemia -Likely in setting of steroids -Continue sliding scale insulin with NovoLog -CBG well-controlled  Class I obesity -BMI 35.08 kg/m    Medications     acyclovir  400 mg Oral BID   amiodarone  200 mg Oral Daily   amLODipine  10 mg Oral Daily   Chlorhexidine Gluconate Cloth  6 each Topical Daily   cholecalciferol  2,000 Units Oral Daily   dexamethasone (DECADRON) injection  4 mg Intravenous P5T   folic acid  1 mg Oral Daily   gabapentin  600 mg Oral BID   heparin injection (subcutaneous)  5,000 Units Subcutaneous Q8H   hydrALAZINE  37.5 mg Oral TID   insulin aspart  0-9 Units Subcutaneous TID WC   lisinopril  40 mg Oral Daily   polyethylene glycol  17 g Oral Daily   senna-docusate  2 tablet Oral BID     Data Reviewed:   CBG:  Recent Labs  Lab 06/04/22 1415 06/04/22 1521 06/04/22 1647 06/04/22 2144 06/05/22 0726  GLUCAP 139* 131* 160* 177* 140*    SpO2: 93 % O2 Flow Rate (L/min): 8 L/min    Vitals:   06/05/22 0500 06/05/22 0600 06/05/22 0700 06/05/22 0800  BP: 113/65 106/70 126/69 118/63  Pulse: (!) 58 62 61 64  Resp: '13 20 18 14  '$ Temp:      TempSrc:      SpO2: 95% 94% 93% 93%  Weight:      Height:          Data Reviewed:  Basic Metabolic Panel: Recent Labs  Lab 05/31/22 0340 06/01/22 0418 06/02/22 0441 06/03/22 0332 06/04/22 1156 06/04/22 1248 06/04/22 1406 06/05/22 0313  NA 135 137 136 137 138 139 139 137  K 3.5 3.6 3.7  3.6 3.8 3.7 3.9 3.9  CL 102 106 102 105  --   --   --  108  CO2 '24 24 23 23  '$ --   --   --  21*  GLUCOSE 156* 157* 145* 144*  --   --   --  155*  BUN 22 25* 24* 22  --   --   --  25*  CREATININE 0.86 0.81 0.77 0.80  --   --   --  0.88  CALCIUM 7.9* 8.2* 8.0* 7.8*  --   --   --  6.9*    CBC: Recent Labs  Lab 05/31/22 0340 06/01/22 0418 06/02/22 0441 06/04/22 1156 06/04/22 1248 06/04/22 1406 06/05/22 0313  WBC 10.4 10.6* 9.9  --   --   --  12.4*  HGB 13.6  13.5 13.5 11.2* 10.2* 9.9* 9.5*  HCT 39.4 39.6 39.7 33.0* 30.0* 29.0* 28.0*  MCV 93.8 95.2 93.9  --   --   --  94.3  PLT 176 161 153  --   --   --  84*    LFT No results for input(s): "AST", "ALT", "ALKPHOS", "BILITOT", "PROT", "ALBUMIN" in the last 168 hours.   Antibiotics: Anti-infectives (From admission, onward)    Start     Dose/Rate Route Frequency Ordered Stop   06/03/22 1300  ceFAZolin (ANCEF) IVPB 2g/100 mL premix        2 g 200 mL/hr over 30 Minutes Intravenous On call to O.R. 06/03/22 0917 06/04/22 0559   05/28/22 2345  acyclovir (ZOVIRAX) tablet 400 mg        400 mg Oral 2 times daily 05/28/22 2333          DVT prophylaxis: Heparin  Code Status: Full code  Family Communication: No family at bedside   CONSULTS neurosurgery   Objective    Physical Examination:   General: Appears in no acute distress Cardiovascular: S1-S2, regular, no murmur auscultated Respiratory: Lungs are clear to auscultation bilaterally Abdomen: Soft, nontender, no organomegaly Extremities: No edema in the lower extremities Neurologic: Alert, moving all extremities,   Status is: Inpatient:             [name]   [facility] If 7PM-7AM, please contact night-coverage at [email or web address], Office  [phone]   06/05/2022, 8:32 AM  LOS: 7 days

## 2022-06-06 DIAGNOSIS — I4891 Unspecified atrial fibrillation: Secondary | ICD-10-CM | POA: Diagnosis not present

## 2022-06-06 DIAGNOSIS — C9000 Multiple myeloma not having achieved remission: Secondary | ICD-10-CM | POA: Diagnosis not present

## 2022-06-06 DIAGNOSIS — M4850XA Collapsed vertebra, not elsewhere classified, site unspecified, initial encounter for fracture: Secondary | ICD-10-CM | POA: Diagnosis not present

## 2022-06-06 LAB — CBC
HCT: 26.8 % — ABNORMAL LOW (ref 39.0–52.0)
Hemoglobin: 9.1 g/dL — ABNORMAL LOW (ref 13.0–17.0)
MCH: 32.2 pg (ref 26.0–34.0)
MCHC: 34 g/dL (ref 30.0–36.0)
MCV: 94.7 fL (ref 80.0–100.0)
Platelets: 74 10*3/uL — ABNORMAL LOW (ref 150–400)
RBC: 2.83 MIL/uL — ABNORMAL LOW (ref 4.22–5.81)
RDW: 14.6 % (ref 11.5–15.5)
WBC: 10.1 10*3/uL (ref 4.0–10.5)
nRBC: 0 % (ref 0.0–0.2)

## 2022-06-06 LAB — GLUCOSE, CAPILLARY
Glucose-Capillary: 141 mg/dL — ABNORMAL HIGH (ref 70–99)
Glucose-Capillary: 167 mg/dL — ABNORMAL HIGH (ref 70–99)
Glucose-Capillary: 158 mg/dL — ABNORMAL HIGH (ref 70–99)
Glucose-Capillary: 232 mg/dL — ABNORMAL HIGH (ref 70–99)

## 2022-06-06 LAB — BASIC METABOLIC PANEL
Anion gap: 5 (ref 5–15)
BUN: 18 mg/dL (ref 8–23)
CO2: 25 mmol/L (ref 22–32)
Calcium: 7.1 mg/dL — ABNORMAL LOW (ref 8.9–10.3)
Chloride: 105 mmol/L (ref 98–111)
Creatinine, Ser: 0.86 mg/dL (ref 0.61–1.24)
GFR, Estimated: >60 mL/min (ref >60)
Glucose, Bld: 153 mg/dL — ABNORMAL HIGH (ref 70–99)
Potassium: 3.8 mmol/L (ref 3.5–5.1)
Sodium: 135 mmol/L (ref 135–145)

## 2022-06-06 NOTE — Progress Notes (Addendum)
Physical Therapy Treatment Patient Details Name: Travis [name] MRN: [id number] DOB: 11/26/1950 Today's Date: 06/06/2022   History of Present Illness Pt is 72 yo male who presents on 06/04/22 for T1-4 PLIF with tumor resection at T2. PMH: OSA on CPAP, HTN, PAF on chronic anticoagulation, history of DVT and multiple myeloma- diagnosed in 2018 and in July 2023, became more aggressive and had a lesion in his right humerus that had to be orthopedically repaired and since last month, he had aching pain in his upper back that has progressively gotten worse    PT Comments    Pt with drain recently removed, noted drainage on dressing seeping through to sheets. RN notified. Pt modA for bed mobility, minA for ambulation with RW. Pt without dizziness this date and was able to amb 120' with RW. Pt reports pain to be under control. Acute PT to cont to follow.    Recommendations for follow up therapy are one component of a multi-disciplinary discharge planning process, led by the attending physician.  Recommendations may be updated based on patient status, additional functional criteria and insurance authorization.  Follow Up Recommendations  Home health PT     Assistance Recommended at Discharge Intermittent Supervision/Assistance  Patient can return home with the following A lot of help with walking and/or transfers;A little help with bathing/dressing/bathroom;Assistance with cooking/housework;Assist for transportation;Help with stairs or ramp for entrance   Equipment Recommendations  None recommended by PT    Recommendations for Other Services OT consult     Precautions / Restrictions Precautions Precautions: Fall;Cervical Precaution Booklet Issued: Yes (comment) Precaution Comments: re-educated on precautions Restrictions Weight Bearing Restrictions: No     Mobility  Bed Mobility Overal bed mobility: Needs Assistance Bed Mobility: Rolling, Sidelying to Sit Rolling: Min assist Sidelying  to sit: Mod assist       General bed mobility comments: HOB elevated, pt able to bring LEs off EOB, modA for trunk elevation and to scoot to EOB    Transfers Overall transfer level: Needs assistance Equipment used: Rolling walker (2 wheels), None Transfers: Sit to/from Stand Sit to Stand: Min assist           General transfer comment: minA to power up, increased time, verbal cues for safe hand placement    Ambulation/Gait Ambulation/Gait assistance: Min assist Gait Distance (Feet): 120 Feet Assistive device: Rolling walker (2 wheels) Gait Pattern/deviations: Step-through pattern, Decreased stride length Gait velocity: decreased Gait velocity interpretation: <1.31 ft/sec, indicative of household ambulator   General Gait Details: pt with difficulty achieving full upright posture as expected from upper back surgery. Pt actively trying but unable ot maintain   Stairs             Wheelchair Mobility    Modified Rankin (Stroke Patients Only)       Balance Overall balance assessment: Needs assistance Sitting-balance support: No upper extremity supported, Feet supported Sitting balance-Leahy Scale: Fair Sitting balance - Comments: guarded   Standing balance support: Bilateral upper extremity supported, During functional activity Standing balance-Leahy Scale: Poor Standing balance comment: recommend UE support for safety at this time                            Cognition Arousal/Alertness: Awake/alert Behavior During Therapy: WFL for tasks assessed/performed Overall Cognitive Status: Within Functional Limits for tasks assessed  Exercises      General Comments General comments (skin integrity, edema, etc.): VSS      Pertinent Vitals/Pain Pain Assessment Pain Assessment: 0-10 Pain Score: 5  Pain Location: upper back/ shoulder blades Pain Descriptors / Indicators: Sore, Guarding,  Grimacing Pain Intervention(s): Monitored during session    Home Living                          Prior Function            PT Goals (current goals can now be found in the care plan section) Acute Rehab PT Goals Patient Stated Goal: return home and he would really like to be able to golf PT Goal Formulation: With patient Time For Goal Achievement: 06/19/22 Potential to Achieve Goals: Good Progress towards PT goals: Progressing toward goals    Frequency    Min 5X/week      PT Plan Current plan remains appropriate    Co-evaluation              AM-PAC PT "6 Clicks" Mobility   Outcome Measure  Help needed turning from your back to your side while in a flat bed without using bedrails?: A Little Help needed moving from lying on your back to sitting on the side of a flat bed without using bedrails?: A Lot Help needed moving to and from a bed to a chair (including a wheelchair)?: A Little Help needed standing up from a chair using your arms (e.g., wheelchair or bedside chair)?: A Little Help needed to walk in hospital room?: A Little Help needed climbing 3-5 steps with a railing? : A Lot 6 Click Score: 16    End of Session Equipment Utilized During Treatment: Gait belt Activity Tolerance: Patient tolerated treatment well Patient left: in chair;with call bell/phone within reach Nurse Communication: Mobility status PT Visit Diagnosis: Unsteadiness on feet (R26.81);Muscle weakness (generalized) (M62.81);Difficulty in walking, not elsewhere classified (R26.2);Pain Pain - part of body:  (upper back)     Time: 0786-7544 PT Time Calculation (min) (ACUTE ONLY): 24 min  Charges:  $Gait Training: 8-22 mins $Therapeutic Activity: 8-22 mins                     Kittie Plater, PT, DPT Acute Rehabilitation Services Secure chat preferred Office #: [phone]    Berline Lopes 06/06/2022, 9:20 AM

## 2022-06-06 NOTE — Evaluation (Signed)
Occupational Therapy Evaluation Patient Details Name: Travis [name] MRN: [id number] DOB: 1950-05-08 Today's Date: 06/06/2022   History of Present Illness Pt is 72 yo male who presents on 06/04/22 for T1-4 PLIF with tumor resection at T2. PMH: OSA on CPAP, HTN, PAF on chronic anticoagulation, history of DVT and multiple myeloma- diagnosed in 2018 and in July 2023, became more aggressive and had a lesion in his right humerus that had to be orthopedically repaired and since last month, he had aching pain in his upper back that has progressively gotten worse   Clinical Impression   This 72 yo male admitted and underwent above presents to acute OT with PLOF of being Mod I with all basic ADLs and A with IADLs. He currently is setup/S- Total A for basic ADLs due to surgery, new precautions, and pain. He will continue to benefit from acute OT without need for follow up OT,      Recommendations for follow up therapy are one component of a multi-disciplinary discharge planning process, led by the attending physician.  Recommendations may be updated based on patient status, additional functional criteria and insurance authorization.   Follow Up Recommendations  No OT follow up     Assistance Recommended at Discharge PRN  Patient can return home with the following A little help with walking and/or transfers;A lot of help with bathing/dressing/bathroom;Assistance with cooking/housework;Help with stairs or ramp for entrance;Assist for transportation    Functional Status Assessment  Patient has had a recent decline in their functional status and demonstrates the ability to make significant improvements in function in a reasonable and predictable amount of time.  Equipment Recommendations  None recommended by OT       Precautions / Restrictions Precautions Precautions: Fall;Cervical Precaution Comments: re-educated on precautions Restrictions Weight Bearing Restrictions: No      Mobility Bed  Mobility               General bed mobility comments: pt up in recliner upon arrival    Transfers Overall transfer level: Needs assistance Equipment used: Rolling walker (2 wheels) Transfers: Sit to/from Stand Sit to Stand: Min guard           General transfer comment: increased time from recliner      Balance Overall balance assessment: Mild deficits observed, not formally tested                                         ADL either performed or assessed with clinical judgement   ADL Overall ADL's : Needs assistance/impaired Eating/Feeding: Independent;Sitting   Grooming: Set up;Sitting   Upper Body Bathing: Set up;Sitting   Lower Body Bathing: Maximal assistance Lower Body Bathing Details (indicate cue type and reason): without AE min guard A sit<>stand Upper Body Dressing : Minimal assistance;Sitting   Lower Body Dressing: Total assistance Lower Body Dressing Details (indicate cue type and reason): without AE, min guard A sit<>stand Toilet Transfer: Min guard;Ambulation;Rolling walker (2 wheels) Toilet Transfer Details (indicate cue type and reason): simulated 4 steps forward and 4 steps back from/to recliner Toileting- Clothing Manipulation and Hygiene: Min guard;Sit to/from stand               Vision Patient Visual Report: No change from baseline              Pertinent Vitals/Pain Pain Assessment Pain Assessment: Faces Pain Score: 5  Faces  Pain Scale: Hurts even more Pain Location: top of shoulder blades Pain Descriptors / Indicators: Aching, Sore Pain Intervention(s): Limited activity within patient's tolerance, Monitored during session, Repositioned, Ice applied     Hand Dominance Right   Extremity/Trunk Assessment Upper Extremity Assessment Upper Extremity Assessment: Generalized weakness RUE Deficits / Details: recent R shoulder surgery--reports he can do anything with it but lift too much RUE Coordination: decreased  gross motor              Cognition Arousal/Alertness: Awake/alert Behavior During Therapy: WFL for tasks assessed/performed Overall Cognitive Status: Within Functional Limits for tasks assessed                                                  Home Living Family/patient expects to be discharged to:: Private residence Living Arrangements: Spouse/significant other Available Help at Discharge: Family;Available 24 hours/day Type of Home: House Home Access: Stairs to enter [employer] of Steps: 4 Entrance Stairs-Rails: Right Home Layout: Two level;Able to live on main level with bedroom/bathroom Alternate Level Stairs-Number of Steps: flight   Bathroom Shower/Tub: Hospital doctor Toilet: Handicapped height Bathroom Accessibility: Yes   Home Equipment: Conservation officer, nature (2 wheels);Cane - single point;Shower seat - built in;Adaptive equipment;Hand held Architectural technologist: Reacher;Sock aid;Long-handled shoe horn;Long-handled sponge        Prior Functioning/Environment Prior Level of Function : Working/employed;Driving;Needs assist             Mobility Comments: working very minimally Biochemist, clinical), 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 ambulating without AD but reports balance has been off ADLs Comments: uses adaptive equipment        OT Problem List: Decreased strength;Decreased range of motion;Impaired balance (sitting and/or standing);Obesity;Pain      OT Treatment/Interventions: Self-care/ADL training;DME and/or AE instruction;Patient/family education;Balance training    OT Goals(Current goals can be found in the care plan section) Acute Rehab OT Goals Patient Stated Goal: to get better and go home, back to work a little OT Goal Formulation: With patient Time For Goal Achievement: 06/20/22 Potential to Achieve Goals: Good  OT Frequency: Min 2X/week       AM-PAC OT "6 Clicks" Daily Activity     Outcome Measure Help from  another person eating meals?: None Help from another person taking care of personal grooming?: A Little Help from another person toileting, which includes using toliet, bedpan, or urinal?: A Little Help from another person bathing (including washing, rinsing, drying)?: A Lot Help from another person to put on and taking off regular upper body clothing?: A Little Help from another person to put on and taking off regular lower body clothing?: A Lot 6 Click Score: 17   End of Session Equipment Utilized During Treatment: Rolling walker (2 wheels)  Activity Tolerance: Patient tolerated treatment well Patient left: in chair;with call bell/phone within reach  OT Visit Diagnosis: Unsteadiness on feet (R26.81);Other abnormalities of gait and mobility (R26.89);Muscle weakness (generalized) (M62.81);Pain Pain - Right/Left:  (both, posterior) Pain - part of body: Shoulder                Time: 3536-1443 OT Time Calculation (min): 16 min Charges:  OT General Charges $OT Visit: 1 Visit OT Evaluation $OT Eval Moderate Complexity: [place] Office [phone]    Almon Register 06/06/2022, 1:25 PM

## 2022-06-06 NOTE — Progress Notes (Signed)
RN stood patient up at bedside to void at patient's request. RN noticed drain site had began to leak. Gauze applied to site. Dr. Ellene Route of neurosurgery paged at [phone] once patient safely back in bed. Dr. Ellene Route on unit several minutes later.

## 2022-06-06 NOTE — Progress Notes (Signed)
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  Because he had constant pain, MRI of C-spine showed progression of multiple myeloma, and the thoracic spine with fracture at T2 with significant osseous retropulsion resulting in cord compression. Underwent emergent radiation treatment per oncology.  Getting chemotherapy every 3 weeks.  In addition oncology and neurosurgery were consulted.  Went to  OR for spinal stabilization and tumor resection on 2/24.    Subjective   Patient seen and examined, complains of soreness in the back.    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 Lorenso Courier as outpatient  Hypertension -Continue amlodipine, hydralazine, lisinopril  Paroxysmal atrial fibrillation -Continue amiodarone -Eliquis on hold for surgery -Eliquis to be resumed as per neurosurgery recommendation  Obstructive sleep apnea -Continue CPAP nightly  Constipation -Continue MiraLAX  History of DVT -He was on Eliquis prior to admission -Eliquis on  hold as above  Hyperglycemia -Likely in setting of steroids -Continue sliding scale insulin with NovoLog -CBG well-controlled  Class I obesity -BMI 35.08 kg/m    Medications     acyclovir  400 mg Oral BID   amiodarone  200 mg Oral Daily   amLODipine  10 mg Oral Daily   Chlorhexidine Gluconate Cloth  6 each Topical Daily   cholecalciferol  2,000 Units Oral Daily   dexamethasone (DECADRON) injection  4 mg Intravenous Y4I   folic acid  1 mg Oral Daily   gabapentin  600 mg Oral BID   heparin injection (subcutaneous)  5,000 Units Subcutaneous Q8H   hydrALAZINE  37.5 mg Oral TID   insulin aspart  0-9 Units Subcutaneous TID WC   ketorolac  15 mg Intravenous Q8H   lisinopril  40 mg Oral Daily   polyethylene glycol  17 g Oral Daily   senna-docusate  2 tablet Oral BID     Data Reviewed:   CBG:  Recent Labs  Lab 06/05/22 1127 06/05/22 1641 06/05/22 2107 06/06/22 0637 06/06/22 1136  GLUCAP 128* 140* 171* 141* 167*    SpO2: 96 % O2 Flow Rate (L/min): 8 L/min    Vitals:   06/06/22 1000 06/06/22 1100 06/06/22 1200 06/06/22 1300  BP: (!) 118/59 125/68 127/65 (!) 131/90  Pulse: 70 65 65 70  Resp: '17 14 14 19  '$ Temp:   97.7 F (36.5 C)   TempSrc:   Oral   SpO2: 95% 96% 94% 96%  Weight:      Height:          Data Reviewed:  Basic Metabolic Panel: Recent Labs  Lab 06/01/22 0418 06/02/22 0441 06/03/22 0332 06/04/22 1156 06/04/22 1248 06/04/22 1406 06/05/22 0313 06/06/22 0359  NA 137 136 137  138 139 139 137 135  K 3.6 3.7 3.6 3.8 3.7 3.9 3.9 3.8  CL 106 102 105  --   --   --  108 105  CO2 '24 23 23  '$ --   --   --  21* 25  GLUCOSE 157* 145* 144*  --   --   --  155* 153*  BUN 25* 24* 22  --   --   --  25* 18  CREATININE 0.81 0.77 0.80  --   --   --  0.88 0.86  CALCIUM 8.2* 8.0* 7.8*  --   --   --  6.9* 7.1*    CBC: Recent Labs  Lab 05/31/22 0340 06/01/22 0418 06/02/22 0441 06/04/22 1156 06/04/22 1248 06/04/22 1406 06/05/22 0313 06/06/22 0359   WBC 10.4 10.6* 9.9  --   --   --  12.4* 10.1  HGB 13.6 13.5 13.5 11.2* 10.2* 9.9* 9.5* 9.1*  HCT 39.4 39.6 39.7 33.0* 30.0* 29.0* 28.0* 26.8*  MCV 93.8 95.2 93.9  --   --   --  94.3 94.7  PLT 176 161 153  --   --   --  84* 74*    LFT No results for input(s): "AST", "ALT", "ALKPHOS", "BILITOT", "PROT", "ALBUMIN" in the last 168 hours.   Antibiotics: Anti-infectives (From admission, onward)    Start     Dose/Rate Route Frequency Ordered Stop   06/03/22 1300  ceFAZolin (ANCEF) IVPB 2g/100 mL premix        2 g 200 mL/hr over 30 Minutes Intravenous On call to O.R. 06/03/22 0917 06/04/22 0559   05/28/22 2345  acyclovir (ZOVIRAX) tablet 400 mg        400 mg Oral 2 times daily 05/28/22 2333          DVT prophylaxis: Heparin  Code Status: Full code  Family Communication: No family at bedside   CONSULTS neurosurgery   Objective    Physical Examination:   Appears in no acute distress S1-S2, regular, no murmur auscultated Lungs clear to auscultation bilaterally Neuro, alert, moving all extremities   Status is: Inpatient:             Travis [name]   [facility] If 7PM-7AM, please contact night-coverage at [email or web address], Office  [phone]   06/06/2022, 1:29 PM  LOS: 8 days

## 2022-06-06 NOTE — Progress Notes (Signed)
Patient ID: Travis [name], male   DOB: 1950/12/30, 72 y.o.   MRN: [id number] Vital signs are stable.  Patient is awake and alert and moving well.  Drainage has minimized itself and drain was removed without difficulty.  Incision is clean and dry.  He will continue his mobilization efforts today on the unit.

## 2022-06-06 NOTE — Anesthesia Postprocedure Evaluation (Signed)
Anesthesia Post Note  Patient: Travis [name]  Procedure(s) Performed: Thoracic one - Thoracic Four Posterior lateral arthrodesis/ fusion and Thoracic two Corpectomy Application of O-Arm     Patient location during evaluation: PACU Anesthesia Type: General Level of consciousness: sedated Pain management: pain level controlled Vital Signs Assessment: post-procedure vital signs reviewed and stable Respiratory status: spontaneous breathing and respiratory function stable Cardiovascular status: stable Postop Assessment: no apparent nausea or vomiting Anesthetic complications: no   There were no known notable events for this encounter.                Dimond Crotty DANIEL

## 2022-06-07 ENCOUNTER — Inpatient Hospital Stay: Payer: Medicare Other | Attending: Hematology and Oncology

## 2022-06-07 ENCOUNTER — Encounter (HOSPITAL_COMMUNITY): Payer: Self-pay | Admitting: Neurosurgery

## 2022-06-07 DIAGNOSIS — R21 Rash and other nonspecific skin eruption: Secondary | ICD-10-CM | POA: Insufficient documentation

## 2022-06-07 DIAGNOSIS — C9000 Multiple myeloma not having achieved remission: Secondary | ICD-10-CM | POA: Insufficient documentation

## 2022-06-07 DIAGNOSIS — I480 Paroxysmal atrial fibrillation: Secondary | ICD-10-CM | POA: Insufficient documentation

## 2022-06-07 DIAGNOSIS — E876 Hypokalemia: Secondary | ICD-10-CM | POA: Insufficient documentation

## 2022-06-07 DIAGNOSIS — M546 Pain in thoracic spine: Secondary | ICD-10-CM | POA: Insufficient documentation

## 2022-06-07 DIAGNOSIS — Z818 Family history of other mental and behavioral disorders: Secondary | ICD-10-CM | POA: Insufficient documentation

## 2022-06-07 DIAGNOSIS — M4850XA Collapsed vertebra, not elsewhere classified, site unspecified, initial encounter for fracture: Secondary | ICD-10-CM | POA: Diagnosis not present

## 2022-06-07 DIAGNOSIS — Z823 Family history of stroke: Secondary | ICD-10-CM | POA: Insufficient documentation

## 2022-06-07 DIAGNOSIS — Z79899 Other long term (current) drug therapy: Secondary | ICD-10-CM | POA: Insufficient documentation

## 2022-06-07 DIAGNOSIS — M5126 Other intervertebral disc displacement, lumbar region: Secondary | ICD-10-CM | POA: Insufficient documentation

## 2022-06-07 DIAGNOSIS — Z5112 Encounter for antineoplastic immunotherapy: Secondary | ICD-10-CM | POA: Insufficient documentation

## 2022-06-07 DIAGNOSIS — Z881 Allergy status to other antibiotic agents status: Secondary | ICD-10-CM | POA: Insufficient documentation

## 2022-06-07 DIAGNOSIS — I4891 Unspecified atrial fibrillation: Secondary | ICD-10-CM | POA: Diagnosis not present

## 2022-06-07 DIAGNOSIS — Z8249 Family history of ischemic heart disease and other diseases of the circulatory system: Secondary | ICD-10-CM | POA: Insufficient documentation

## 2022-06-07 DIAGNOSIS — Z86718 Personal history of other venous thrombosis and embolism: Secondary | ICD-10-CM | POA: Insufficient documentation

## 2022-06-07 DIAGNOSIS — Z7901 Long term (current) use of anticoagulants: Secondary | ICD-10-CM | POA: Insufficient documentation

## 2022-06-07 LAB — BASIC METABOLIC PANEL
Anion gap: 8 (ref 5–15)
BUN: 21 mg/dL (ref 8–23)
CO2: 21 mmol/L — ABNORMAL LOW (ref 22–32)
Calcium: 7 mg/dL — ABNORMAL LOW (ref 8.9–10.3)
Chloride: 105 mmol/L (ref 98–111)
Creatinine, Ser: 0.73 mg/dL (ref 0.61–1.24)
GFR, Estimated: >60 mL/min (ref >60)
Glucose, Bld: 144 mg/dL — ABNORMAL HIGH (ref 70–99)
Potassium: 4.5 mmol/L (ref 3.5–5.1)
Sodium: 134 mmol/L — ABNORMAL LOW (ref 135–145)

## 2022-06-07 LAB — GLUCOSE, CAPILLARY
Glucose-Capillary: 184 mg/dL — ABNORMAL HIGH (ref 70–99)
Glucose-Capillary: 182 mg/dL — ABNORMAL HIGH (ref 70–99)
Glucose-Capillary: 224 mg/dL — ABNORMAL HIGH (ref 70–99)
Glucose-Capillary: 143 mg/dL — ABNORMAL HIGH (ref 70–99)

## 2022-06-07 LAB — TYPE AND SCREEN
ABO/RH(D): O POS
Antibody Screen: POSITIVE
Unit division: 0
Unit division: 0

## 2022-06-07 LAB — BPAM RBC
Blood Product Expiration Date: 202403082359
Blood Product Expiration Date: 202403112359
ISSUE DATE / TIME: 202402021039
Unit Type and Rh: 5100
Unit Type and Rh: 5100

## 2022-06-07 LAB — CBC
HCT: 23.9 % — ABNORMAL LOW (ref 39.0–52.0)
Hemoglobin: 8.2 g/dL — ABNORMAL LOW (ref 13.0–17.0)
MCH: 32.4 pg (ref 26.0–34.0)
MCHC: 34.3 g/dL (ref 30.0–36.0)
MCV: 94.5 fL (ref 80.0–100.0)
Platelets: 49 10*3/uL — ABNORMAL LOW (ref 150–400)
RBC: 2.53 MIL/uL — ABNORMAL LOW (ref 4.22–5.81)
RDW: 14.5 % (ref 11.5–15.5)
WBC: 8.4 10*3/uL (ref 4.0–10.5)
nRBC: 0 % (ref 0.0–0.2)

## 2022-06-07 LAB — SURGICAL PATHOLOGY

## 2022-06-07 MED FILL — Thrombin For Soln 5000 Unit: CUTANEOUS | Qty: 5000 | Status: AC

## 2022-06-07 NOTE — Progress Notes (Signed)
Physical Therapy Treatment Patient Details Name: Travis [name] MRN: [id number] DOB: 12-13-1950 Today's Date: 06/07/2022   History of Present Illness Pt is 72 yo male who presents on 06/04/22 for T1-4 PLIF with tumor resection at T2. PMH: OSA on CPAP, HTN, PAF on chronic anticoagulation, history of DVT and multiple myeloma- diagnosed in 2018 and in July 2023, became more aggressive and had a lesion in his right humerus that had to be orthopedically repaired and since last month, he had aching pain in his upper back that has progressively gotten worse    PT Comments    Pt progressing well towards all goals. Pt able to complete stair negotiation needs to safely enter home. Pt with most difficulty with bed mobility however is able to sleep in lift chair until HHPT can work on bed mobility in his bed. Acute PT to cont to follow.    Recommendations for follow up therapy are one component of a multi-disciplinary discharge planning process, led by the attending physician.  Recommendations may be updated based on patient status, additional functional criteria and insurance authorization.  Follow Up Recommendations  Home health PT     Assistance Recommended at Discharge Intermittent Supervision/Assistance  Patient can return home with the following A lot of help with walking and/or transfers;A little help with bathing/dressing/bathroom;Assistance with cooking/housework;Assist for transportation;Help with stairs or ramp for entrance   Equipment Recommendations  None recommended by PT    Recommendations for Other Services OT consult     Precautions / Restrictions Precautions Precautions: Fall;Cervical Precaution Booklet Issued: Yes (comment) Precaution Comments: re-educated on precautions Restrictions Weight Bearing Restrictions: No     Mobility  Bed Mobility Overal bed mobility: Needs Assistance Bed Mobility: Rolling, Sidelying to Sit Rolling: Min assist Sidelying to sit: Mod assist        General bed mobility comments: worked on rolling from [facility] in lower height, pt with increased difficulty getting up, discussed sleeping in reclining lift chair until HHPT can work on bed mobility in his home    Transfers Overall transfer level: Needs assistance Equipment used: Rolling walker (2 wheels) Transfers: Sit to/from Stand Sit to Stand: Min guard           General transfer comment: good hand placement    Ambulation/Gait Ambulation/Gait assistance: Min guard Gait Distance (Feet): 300 Feet Assistive device: Rolling walker (2 wheels) Gait Pattern/deviations: Step-through pattern, Decreased stride length Gait velocity: decreased Gait velocity interpretation: <1.31 ft/sec, indicative of household ambulator   General Gait Details: worked on shld retraction to obtain more upright posture, pt with good endurance and fluid reciprocal gait   Stairs Stairs: Yes Stairs assistance: Min guard Stair Management: One rail Right, Sideways, Step to pattern Number of Stairs: 4 (to mimic home set up) General stair comments: used R handrail to mimic home set up, sideways to minimize twisting, good technique   Wheelchair Mobility    Modified Rankin (Stroke Patients Only)       Balance Overall balance assessment: Mild deficits observed, not formally tested Sitting-balance support: No upper extremity supported, Feet supported Sitting balance-Leahy Scale: Fair Sitting balance - Comments: guarded   Standing balance support: Bilateral upper extremity supported, During functional activity Standing balance-Leahy Scale: Fair Standing balance comment: recommend UE support for safety at this time                            Cognition Arousal/Alertness: Awake/alert Behavior During Therapy: [facility] for tasks assessed/performed  Overall Cognitive Status: Within Functional Limits for tasks assessed                                          Exercises Other  Exercises Other Exercises: worked on slow bilat shoulder retraction and pull scapulas downward to promote scapular gliding Other Exercises: passive R shld ROM to 90 deg    General Comments General comments (skin integrity, edema, etc.): VSS      Pertinent Vitals/Pain Pain Assessment Pain Assessment: Faces Faces Pain Scale: Hurts little more Pain Location: top of shoulder blades Pain Descriptors / Indicators: Aching, Sore    Home Living                          Prior Function            PT Goals (current goals can now be found in the care plan section) Acute Rehab PT Goals Patient Stated Goal: return home and he would really like to be able to golf PT Goal Formulation: With patient Time For Goal Achievement: 06/19/22 Potential to Achieve Goals: Good Progress towards PT goals: Progressing toward goals    Frequency    Min 5X/week      PT Plan Current plan remains appropriate    Co-evaluation              AM-PAC PT "6 Clicks" Mobility   Outcome Measure  Help needed turning from your back to your side while in a flat bed without using bedrails?: A Little Help needed moving from lying on your back to sitting on the side of a flat bed without using bedrails?: A Lot Help needed moving to and from a bed to a chair (including a wheelchair)?: A Little Help needed standing up from a chair using your arms (e.g., wheelchair or bedside chair)?: A Little Help needed to walk in hospital room?: A Little Help needed climbing 3-5 steps with a railing? : A Lot 6 Click Score: 16    End of Session Equipment Utilized During Treatment: Gait belt Activity Tolerance: Patient tolerated treatment well Patient left: in chair;with call bell/phone within reach Nurse Communication: Mobility status PT Visit Diagnosis: Unsteadiness on feet (R26.81);Muscle weakness (generalized) (M62.81);Difficulty in walking, not elsewhere classified (R26.2);Pain Pain - part of body:  (upper  back)     Time: 3646-8032 PT Time Calculation (min) (ACUTE ONLY): 37 min  Charges:  $Gait Training: 8-22 mins                     Kittie Plater, PT, DPT Acute Rehabilitation Services Secure chat preferred Office #: [phone]    Berline Lopes 06/07/2022, 12:25 PM

## 2022-06-07 NOTE — Progress Notes (Signed)
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  Because he had constant pain, MRI of C-spine showed progression of multiple myeloma, and the thoracic spine with fracture at T2 with significant osseous retropulsion resulting in cord compression. Underwent emergent radiation treatment per oncology.  Getting chemotherapy every 3 weeks.  In addition oncology and neurosurgery were consulted.  Went to  OR for spinal stabilization and tumor resection on 2/24.    Subjective   Patient seen and examined, no new complaints.  Platelet count has gone down to 49,000 this morning.    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 Lorenso Courier as outpatient  Anemia/pancytopenia -Likely post postop and dilutional effect -Patient also has multiple myeloma -Platelet count is down to 49,000 -Will discontinue heparin -I have consulted hematology, Dr. Lorenso Courier to see patient today   Hypertension -Continue amlodipine, hydralazine, lisinopril  Paroxysmal atrial  fibrillation -Continue amiodarone -Eliquis was held for surgery -Eliquis to be resumed as per neurosurgery recommendation  Obstructive sleep apnea -Continue CPAP nightly  Constipation -Continue MiraLAX  History of DVT -He was on Eliquis prior to admission -Eliquis on hold as above  Hyperglycemia -Likely in setting of steroids -Continue sliding scale insulin with NovoLog -CBG well-controlled  Class I obesity -BMI 35.08 kg/m    Medications     acyclovir  400 mg Oral BID   amiodarone  200 mg Oral Daily   amLODipine  10 mg Oral Daily   Chlorhexidine Gluconate Cloth  6 each Topical Daily   cholecalciferol  2,000 Units Oral Daily   dexamethasone (DECADRON) injection  4 mg Intravenous N0N   folic acid  1 mg Oral Daily   gabapentin  600 mg Oral BID   heparin injection (subcutaneous)  5,000 Units Subcutaneous Q8H   hydrALAZINE  37.5 mg Oral TID   insulin aspart  0-9 Units Subcutaneous TID WC   ketorolac  15 mg Intravenous Q8H   lisinopril  40 mg Oral Daily   polyethylene glycol  17 g Oral Daily   senna-docusate  2 tablet Oral BID     Data Reviewed:   CBG:  Recent Labs  Lab 06/06/22 0637 06/06/22 1136 06/06/22 1711 06/06/22 2137 06/07/22 0754  GLUCAP 141* 167* 158* 232* 184*    SpO2: 96 % O2 Flow Rate (L/min): 8 L/min    Vitals:   06/07/22 0500 06/07/22 0600 06/07/22 0700 06/07/22 0800  BP: (!) 140/64 139/65 136/66   Pulse: (!) 55 (!) 54 (!) 58   Resp: '13 16 13   '$ Temp:    98.5 F (36.9 C)  TempSrc:    Oral  SpO2: 96% 97% 96%   Weight:  Height:          Data Reviewed:  Basic Metabolic Panel: Recent Labs  Lab 06/02/22 0441 06/03/22 0332 06/04/22 1156 06/04/22 1248 06/04/22 1406 06/05/22 0313 06/06/22 0359 06/07/22 0321  NA 136 137   < > 139 139 137 135 134*  K 3.7 3.6   < > 3.7 3.9 3.9 3.8 4.5  CL 102 105  --   --   --  108 105 105  CO2 23 23  --   --   --  21* 25 21*  GLUCOSE 145* 144*  --   --   --  155* 153* 144*  BUN 24* 22   --   --   --  25* 18 21  CREATININE 0.77 0.80  --   --   --  0.88 0.86 0.73  CALCIUM 8.0* 7.8*  --   --   --  6.9* 7.1* 7.0*   < > = values in this interval not displayed.    CBC: Recent Labs  Lab 06/01/22 0418 06/02/22 0441 06/04/22 1156 06/04/22 1248 06/04/22 1406 06/05/22 0313 06/06/22 0359 06/07/22 0321  WBC 10.6* 9.9  --   --   --  12.4* 10.1 8.4  HGB 13.5 13.5   < > 10.2* 9.9* 9.5* 9.1* 8.2*  HCT 39.6 39.7   < > 30.0* 29.0* 28.0* 26.8* 23.9*  MCV 95.2 93.9  --   --   --  94.3 94.7 94.5  PLT 161 153  --   --   --  84* 74* 49*   < > = values in this interval not displayed.    LFT No results for input(s): "AST", "ALT", "ALKPHOS", "BILITOT", "PROT", "ALBUMIN" in the last 168 hours.   Antibiotics: Anti-infectives (From admission, onward)    Start     Dose/Rate Route Frequency Ordered Stop   06/03/22 1300  ceFAZolin (ANCEF) IVPB 2g/100 mL premix        2 g 200 mL/hr over 30 Minutes Intravenous On call to O.R. 06/03/22 0917 06/04/22 0559   05/28/22 2345  acyclovir (ZOVIRAX) tablet 400 mg        400 mg Oral 2 times daily 05/28/22 2333          DVT prophylaxis: Start SCDs on 06/07/2022, discontinued heparin due to thrombocytopenia  Code Status: Full code  Family Communication: No family at bedside   CONSULTS neurosurgery   Objective    Physical Examination:   Appears in no acute distress S1-S2, regular, no murmur auscultated Lungs are clear to auscultation bilaterally Abdomen is soft, nontender, no organomegaly No edema in lower extremities   Status is: Inpatient:             [name]   [facility] If 7PM-7AM, please contact night-coverage at [email or web address], Office  [phone]   06/07/2022, 8:18 AM  LOS: 9 days

## 2022-06-07 NOTE — Progress Notes (Signed)
Hematology/Oncology Progress Note  Clinical Summary: Mr. 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  Interval History: -- Mr. Travis [name] underwent a thoracic 1 through 4 posterior lateral arthrodesis/fusion and 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.  Confirmed by pathology. -- Currently recovering from his surgery.  Reports that he is sore but recovering well.  Pain is under good control -- Patient is eating well and has good levels of energy -- Denies any overt bleeding such as nosebleeds, gum bleeding, or dark stools -- Recent myeloma proteins showed increase, concerning for failure of current line of chemotherapy. -- Discussed current findings and plan moving forward with patient and his wife  O:  Vitals:   06/07/22 2104 06/07/22 2110  BP:  (!) 155/80  Pulse:  61  Resp: 15 17  Temp:  98.2 F (36.8 C)  SpO2:  96%      Latest Ref Rng & Units 06/07/2022    3:21 AM 06/06/2022    3:59 AM 06/05/2022    3:13 AM  CMP  Glucose 70 - 99 mg/dL 144  153  155   BUN 8 - 23 mg/dL '21  18  25   '$ Creatinine 0.61 - 1.24 mg/dL 0.73  0.86  0.88   Sodium 135 - 145 mmol/L 134  135  137   Potassium 3.5 - 5.1 mmol/L 4.5  3.8  3.9   Chloride 98 - 111 mmol/L 105  105  108   CO2 22 - 32 mmol/L '21  25  21   '$ Calcium 8.9 - 10.3 mg/dL 7.0  7.1  6.9       Latest Ref Rng & Units 06/07/2022    3:21 AM 06/06/2022    3:59 AM 06/05/2022    3:13 AM  CBC  WBC 4.0 - 10.5 K/uL 8.4  10.1  12.4   Hemoglobin 13.0 - 17.0 g/dL 8.2  9.1  9.5   Hematocrit 39.0 - 52.0 % 23.9  26.8  28.0   Platelets 150 - 400 K/uL 49  74  84       GENERAL: well appearing elderly Caucasian male in NAD  SKIN: skin color, texture, turgor are normal, no rashes or significant lesions EYES: conjunctiva are pink and non-injected, sclera clear LUNGS: clear to auscultation and  percussion with normal breathing effort HEART: regular rate & rhythm and no murmurs and no lower extremity edema Musculoskeletal: no cyanosis of digits and no clubbing  PSYCH: alert & oriented x 3, fluent speech NEURO: no focal motor/sensory deficits  Assessment/Plan:  # Anemia/Thrombocytopenia -- Appears to be postsurgical in nature.  Likely due to blood loss during surgery and consumption of platelets for clotting around surgical site -- Will order iron panel, ferritin, vitamin B12, and folate now to assess for nutritional deficiencies. -- Recommend transfusion for hemoglobin less than 7 or hemoglobin less than 8 and symptomatic and platelets less than 20 --Labs today show white blood cell count 8.4, hemoglobin 8.2, MCV 94.5, and platelets of 49 -- Will plan to follow patient closely in hematology clinic  # Refractory Multiple Myeloma -- Based on this spinal lesion and progression of serum proteins will need to change chemotherapy regimens.  Currently on Dara/rev/Dex chemotherapy will need to transition to another line of therapy, anticipate a Velcade based regimen.  Will discuss further in the outpatient setting once patient has recovered from his  surgery  Ledell Peoples, MD Department of Hematology/Oncology [place] [facility] Phone: [phone] Pager: [phone] Email: [email or web address]

## 2022-06-08 ENCOUNTER — Telehealth: Payer: Self-pay | Admitting: Hematology and Oncology

## 2022-06-08 DIAGNOSIS — I4891 Unspecified atrial fibrillation: Secondary | ICD-10-CM | POA: Diagnosis not present

## 2022-06-08 DIAGNOSIS — M4850XA Collapsed vertebra, not elsewhere classified, site unspecified, initial encounter for fracture: Secondary | ICD-10-CM | POA: Diagnosis not present

## 2022-06-08 DIAGNOSIS — Z86718 Personal history of other venous thrombosis and embolism: Secondary | ICD-10-CM | POA: Diagnosis not present

## 2022-06-08 LAB — RETICULOCYTES
Immature Retic Fract: 30 % — ABNORMAL HIGH (ref 2.3–15.9)
RBC.: 2.51 MIL/uL — ABNORMAL LOW (ref 4.22–5.81)
Retic Count, Absolute: 67.3 10*3/uL (ref 19.0–186.0)
Retic Ct Pct: 2.7 % (ref 0.4–3.1)

## 2022-06-08 LAB — BASIC METABOLIC PANEL
Anion gap: 7 (ref 5–15)
BUN: 18 mg/dL (ref 8–23)
CO2: 22 mmol/L (ref 22–32)
Calcium: 7.4 mg/dL — ABNORMAL LOW (ref 8.9–10.3)
Chloride: 105 mmol/L (ref 98–111)
Creatinine, Ser: 0.78 mg/dL (ref 0.61–1.24)
GFR, Estimated: >60 mL/min (ref >60)
Glucose, Bld: 149 mg/dL — ABNORMAL HIGH (ref 70–99)
Potassium: 4.4 mmol/L (ref 3.5–5.1)
Sodium: 134 mmol/L — ABNORMAL LOW (ref 135–145)

## 2022-06-08 LAB — IRON AND TIBC
Iron: 44 ug/dL — ABNORMAL LOW (ref 45–182)
Saturation Ratios: 21 % (ref 17.9–39.5)
TIBC: 210 ug/dL — ABNORMAL LOW (ref 250–450)
UIBC: 166 ug/dL

## 2022-06-08 LAB — CBC
HCT: 23.8 % — ABNORMAL LOW (ref 39.0–52.0)
Hemoglobin: 8.2 g/dL — ABNORMAL LOW (ref 13.0–17.0)
MCH: 32.5 pg (ref 26.0–34.0)
MCHC: 34.5 g/dL (ref 30.0–36.0)
MCV: 94.4 fL (ref 80.0–100.0)
Platelets: 89 10*3/uL — ABNORMAL LOW (ref 150–400)
RBC: 2.52 MIL/uL — ABNORMAL LOW (ref 4.22–5.81)
RDW: 14.6 % (ref 11.5–15.5)
WBC: 7.5 10*3/uL (ref 4.0–10.5)
nRBC: 0.3 % — ABNORMAL HIGH (ref 0.0–0.2)

## 2022-06-08 LAB — GLUCOSE, CAPILLARY
Glucose-Capillary: 162 mg/dL — ABNORMAL HIGH (ref 70–99)
Glucose-Capillary: 109 mg/dL — ABNORMAL HIGH (ref 70–99)
Glucose-Capillary: 135 mg/dL — ABNORMAL HIGH (ref 70–99)
Glucose-Capillary: 189 mg/dL — ABNORMAL HIGH (ref 70–99)

## 2022-06-08 LAB — IMMATURE PLATELET FRACTION: Immature Platelet Fraction: 5.7 % (ref 1.2–8.6)

## 2022-06-08 LAB — VITAMIN B12: Vitamin B-12: 256 pg/mL (ref 180–914)

## 2022-06-08 LAB — HEPARIN INDUCED PLATELET AB (HIT ANTIBODY): Heparin Induced Plt Ab: 0.071 OD (ref 0.000–0.400)

## 2022-06-08 LAB — FERRITIN: Ferritin: 362 ng/mL — ABNORMAL HIGH (ref 24–336)

## 2022-06-08 LAB — FOLATE: Folate: 13.1 ng/mL (ref >5.9)

## 2022-06-08 NOTE — Progress Notes (Signed)
Patient ID: Travis [name], male   DOB: Apr 13, 1951, 72 y.o.   MRN: [id number] BP (!) 153/76 (BP Location: Left Arm)   Pulse 64   Temp 97.9 F (36.6 C) (Oral)   Resp 17   Ht '5\' 10"'$  (1.778 m)   Wt 110.9 kg   SpO2 97%   BMI 35.08 kg/m  Alert and oriented x 4 Moving all extremities well and equally Wound is clean, dry, no signs of infection.  Discharge tomorrow

## 2022-06-08 NOTE — Progress Notes (Signed)
Mobility Specialist Progress Note    06/08/22 1418  Mobility  Activity Ambulated with assistance in hallway  Level of Assistance Contact guard assist, steadying assist  Assistive Device Front wheel walker  Distance Ambulated (ft) 400 ft  Activity Response Tolerated well  Mobility Referral Yes  $Mobility charge 1 Mobility   Pre-Mobility: 65 HR, 96% SpO2 During Mobility: 83 HR Post-Mobility: 66 HR  Pt received in chair and agreeable. No complaints on walk. Returned to chair with call bell in reach.   Hildred Alamin Mobility Specialist  Please Psychologist, sport and exercise or Rehab Office at [phone]

## 2022-06-08 NOTE — Progress Notes (Signed)
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  Because he had constant pain, MRI of C-spine showed progression of multiple myeloma, and the thoracic spine with fracture at T2 with significant osseous retropulsion resulting in cord compression. Underwent emergent radiation treatment per oncology.  Getting chemotherapy every 3 weeks.  In addition oncology and neurosurgery were consulted.  Went to  OR for spinal stabilization and tumor resection on 2/24.    Subjective   Patient seen and examined, ambulating in the hallway.  Denies any complaints.   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 Lorenso Courier as outpatient  Anemia/pancytopenia -Likely post postop and dilutional effect -Patient also has multiple myeloma -Platelet count went down to 49,000; hematology was consulted -Dr. Lorenso Courier saw patient in the hospital -Heparin was discontinued -Platelet count has improved to 89,000 this morning -Follow iron panel, ferritin, B12, folate -Transfuse for  hemoglobin less than 7 and platelet count less than 20  Hypertension -Continue amlodipine, hydralazine, lisinopril  Paroxysmal atrial fibrillation -Continue amiodarone -Eliquis was held for surgery -Eliquis to be resumed as per neurosurgery recommendation  Obstructive sleep apnea -Continue CPAP nightly  Constipation -Continue MiraLAX  History of DVT -He was on Eliquis prior to admission -Eliquis on hold as above  Hyperglycemia -Likely in setting of steroids -Continue sliding scale insulin with NovoLog -CBG well-controlled  Class I obesity -BMI 35.08 kg/m    Medications     acyclovir  400 mg Oral BID   amiodarone  200 mg Oral Daily   amLODipine  10 mg Oral Daily   Chlorhexidine Gluconate Cloth  6 each Topical Daily   cholecalciferol  2,000 Units Oral Daily   dexamethasone (DECADRON) injection  4 mg Intravenous Y0D   folic acid  1 mg Oral Daily   gabapentin  600 mg Oral BID   hydrALAZINE  37.5 mg Oral TID   insulin aspart  0-9 Units Subcutaneous TID WC   ketorolac  15 mg Intravenous Q8H   lisinopril  40 mg Oral Daily   polyethylene glycol  17 g Oral Daily   senna-docusate  2 tablet Oral BID     Data Reviewed:   CBG:  Recent Labs  Lab 06/07/22 0754 06/07/22 1110 06/07/22 1648 06/07/22 2112 06/08/22 0746  GLUCAP 184* 182* 224* 143* 162*    SpO2: 98 % O2 Flow Rate (L/min): 8 L/min    Vitals:   06/07/22 2110 06/07/22 2327 06/08/22 0257 06/08/22 0747  BP: (!) 155/80 (!) 153/73 132/68 (!) 149/66  Pulse: 61 63 (!) 53 65  Resp: '17 16 15 17  '$ Temp: 98.2 F (36.8 C) 97.7 F (36.5 C) 97.7 F (36.5 C) 97.9 F (36.6 C)  TempSrc: Oral Axillary Oral Oral  SpO2: 96% 97% 96% 98%  Weight:      Height:          Data Reviewed:  Basic Metabolic Panel: Recent Labs  Lab 06/03/22 0332 06/04/22 1156 06/04/22 1406 06/05/22 0313 06/06/22 0359 06/07/22 0321 06/08/22 0305  NA 137   < > 139 137 135 134* 134*  K 3.6   < > 3.9 3.9 3.8 4.5 4.4  CL 105   --   --  108 105 105 105  CO2 23  --   --  21* 25 21* 22  GLUCOSE 144*  --   --  155* 153* 144* 149*  BUN 22  --   --  25* '18 21 18  '$ CREATININE 0.80  --   --  0.88 0.86 0.73 0.78  CALCIUM 7.8*  --   --  6.9* 7.1* 7.0* 7.4*   < > = values in this interval not displayed.    CBC: Recent Labs  Lab 06/02/22 0441 06/04/22 1156 06/04/22 1406 06/05/22 0313 06/06/22 0359 06/07/22 0321 06/08/22 0305  WBC 9.9  --   --  12.4* 10.1 8.4 7.5  HGB 13.5   < > 9.9* 9.5* 9.1* 8.2* 8.2*  HCT 39.7   < > 29.0* 28.0* 26.8* 23.9* 23.8*  MCV 93.9  --   --  94.3 94.7 94.5 94.4  PLT 153  --   --  84* 74* 49* 89*   < > = values in this interval not displayed.    LFT No results for input(s): "AST", "ALT", "ALKPHOS", "BILITOT", "PROT", "ALBUMIN" in the last 168 hours.   Antibiotics: Anti-infectives (From admission, onward)    Start     Dose/Rate Route Frequency Ordered Stop   06/03/22 1300  ceFAZolin (ANCEF) IVPB 2g/100 mL premix        2 g 200 mL/hr over 30 Minutes Intravenous On call to O.R. 06/03/22 0917 06/04/22 0559   05/28/22 2345  acyclovir (ZOVIRAX) tablet 400 mg        400 mg Oral 2 times daily 05/28/22 2333          DVT prophylaxis: Start SCDs on 06/07/2022, discontinued heparin due to thrombocytopenia  Code Status: Full code  Family Communication: No family at bedside   CONSULTS neurosurgery   Objective    Physical Examination:   General-appears in no acute distress Heart-S1-S2, regular, no murmur auscultated Lungs-clear to auscultation bilaterally, no wheezing or crackles auscultated Abdomen-soft, nontender, no organomegaly Extremities-no edema in the lower extremities    Status is: Inpatient:             [name]   [facility] If 7PM-7AM, please contact night-coverage at [email or web address], Office  [phone]   06/08/2022, 8:18 AM  LOS: 10 days

## 2022-06-08 NOTE — Plan of Care (Signed)
  Problem: Education: Goal: Knowledge of General Education information will improve Description: Including pain rating scale, medication(s)/side effects and non-pharmacologic comfort measures Outcome: Progressing   Problem: Health Behavior/Discharge Planning: Goal: Ability to manage health-related needs will improve Outcome: Progressing   Problem: Clinical Measurements: Goal: Ability to maintain clinical measurements within normal limits will improve Outcome: Progressing Goal: Will remain free from infection Outcome: Progressing Goal: Diagnostic test results will improve Outcome: Progressing Goal: Respiratory complications will improve Outcome: Progressing Goal: Cardiovascular complication will be avoided Outcome: Progressing   Problem: Activity: Goal: Risk for activity intolerance will decrease Outcome: Progressing   Problem: Nutrition: Goal: Adequate nutrition will be maintained Outcome: Progressing   Problem: Coping: Goal: Level of anxiety will decrease Outcome: Progressing   Problem: Elimination: Goal: Will not experience complications related to bowel motility Outcome: Progressing Goal: Will not experience complications related to urinary retention Outcome: Progressing   Problem: Pain Managment: Goal: General experience of comfort will improve Outcome: Progressing   Problem: Safety: Goal: Ability to remain free from injury will improve Outcome: Progressing   Problem: Skin Integrity: Goal: Risk for impaired skin integrity will decrease Outcome: Progressing   Problem: Education: Goal: Ability to describe self-care measures that may prevent or decrease complications (Diabetes Survival Skills Education) will improve Outcome: Progressing Goal: Individualized Educational Video(s) Outcome: Progressing   Problem: Coping: Goal: Ability to adjust to condition or change in health will improve Outcome: Progressing   Problem: Fluid Volume: Goal: Ability to  maintain a balanced intake and output will improve Outcome: Progressing   Problem: Health Behavior/Discharge Planning: Goal: Ability to identify and utilize available resources and services will improve Outcome: Progressing Goal: Ability to manage health-related needs will improve Outcome: Progressing   Problem: Metabolic: Goal: Ability to maintain appropriate glucose levels will improve Outcome: Progressing   Problem: Nutritional: Goal: Maintenance of adequate nutrition will improve Outcome: Progressing Goal: Progress toward achieving an optimal weight will improve Outcome: Progressing   Problem: Skin Integrity: Goal: Risk for impaired skin integrity will decrease Outcome: Progressing   Problem: Tissue Perfusion: Goal: Adequacy of tissue perfusion will improve Outcome: Progressing   Problem: Education: Goal: Ability to verbalize activity precautions or restrictions will improve Outcome: Progressing Goal: Knowledge of the prescribed therapeutic regimen will improve Outcome: Progressing Goal: Understanding of discharge needs will improve Outcome: Progressing   Problem: Activity: Goal: Ability to avoid complications of mobility impairment will improve Outcome: Progressing Goal: Ability to tolerate increased activity will improve Outcome: Progressing Goal: Will remain free from falls Outcome: Progressing   Problem: Bowel/Gastric: Goal: Gastrointestinal status for postoperative course will improve Outcome: Progressing   Problem: Clinical Measurements: Goal: Ability to maintain clinical measurements within normal limits will improve Outcome: Progressing Goal: Postoperative complications will be avoided or minimized Outcome: Progressing Goal: Diagnostic test results will improve Outcome: Progressing   Problem: Pain Management: Goal: Pain level will decrease Outcome: Progressing   Problem: Skin Integrity: Goal: Will show signs of wound healing Outcome:  Progressing   Problem: Health Behavior/Discharge Planning: Goal: Identification of resources available to assist in meeting health care needs will improve Outcome: Progressing   Problem: Bladder/Genitourinary: Goal: Urinary functional status for postoperative course will improve Outcome: Progressing

## 2022-06-08 NOTE — Progress Notes (Signed)
Physical Therapy Treatment Patient Details Name: Travis [name] MRN: [id number] DOB: Sep 30, 1950 Today's Date: 06/08/2022   History of Present Illness Pt is 72 yo male who presents on 06/04/22 for T1-4 PLIF with tumor resection at T2. PMH: OSA on CPAP, HTN, PAF on chronic anticoagulation, history of DVT and multiple myeloma- diagnosed in 2018 and in July 2023, became more aggressive and had a lesion in his right humerus that had to be orthopedically repaired and since last month, he had aching pain in his upper back that has progressively gotten worse    PT Comments    Pt progressing well towards all goals. Pt with improved ability for upper trunk extension however continues to remain kyphotic with forward head. Pt diligent with his shoulder/scapula ROM exercises. Working on progressing to ambulation without RW however at this time continues to benefit. Acute PT to cont to follow.    Recommendations for follow up therapy are one component of a multi-disciplinary discharge planning process, led by the attending physician.  Recommendations may be updated based on patient status, additional functional criteria and insurance authorization.  Follow Up Recommendations  Home health PT     Assistance Recommended at Discharge Intermittent Supervision/Assistance  Patient can return home with the following A lot of help with walking and/or transfers;A little help with bathing/dressing/bathroom;Assistance with cooking/housework;Assist for transportation;Help with stairs or ramp for entrance   Equipment Recommendations  None recommended by PT    Recommendations for Other Services OT consult     Precautions / Restrictions Precautions Precautions: Fall;Cervical Precaution Booklet Issued: Yes (comment) Precaution Comments: re-educated on precautions Restrictions Weight Bearing Restrictions: No     Mobility  Bed Mobility Overal bed mobility: Needs Assistance Bed Mobility: Rolling, Sidelying to  Sit Rolling: Min assist Sidelying to sit: Mod assist       General bed mobility comments: HOB at 45 degress, pt able to bring LEs over and bring trunk up with minimal assist this date    Transfers Overall transfer level: Needs assistance Equipment used: None Transfers: Sit to/from Stand Sit to Stand: Min guard           General transfer comment: pt able to stand up without of RW this date, pt with wide base of support and increased time but able to complete    Ambulation/Gait Ambulation/Gait assistance: Min guard Gait Distance (Feet): 400 Feet Assistive device: Rolling walker (2 wheels) Gait Pattern/deviations: Step-through pattern, Decreased stride length Gait velocity: decreased Gait velocity interpretation: 1.31 - 2.62 ft/sec, indicative of limited community ambulator   General Gait Details: worked on shld retraction to obtain more upright posture, pt with good endurance and fluid reciprocal gait, progressive increased support on bilat UEs with onset of fatigue/increased amb distance   Chief Strategy Officer    Modified Rankin (Stroke Patients Only)       Balance Overall balance assessment: Mild deficits observed, not formally tested Sitting-balance support: No upper extremity supported, Feet supported Sitting balance-Leahy Scale: Fair Sitting balance - Comments: guarded   Standing balance support: Bilateral upper extremity supported, During functional activity Standing balance-Leahy Scale: Fair Standing balance comment: recommend UE support for safety at this time                            Cognition Arousal/Alertness: Awake/alert Behavior During Therapy: WFL for tasks assessed/performed Overall Cognitive Status: Within Functional Limits for tasks assessed  Exercises Other Exercises Other Exercises: worked on slow bilat shoulder retraction and pulling scapulas  downward to promote scapular gliding Other Exercises: passive bilat shld ROM to 90 deg    General Comments General comments (skin integrity, edema, etc.): VSS      Pertinent Vitals/Pain Pain Assessment Pain Assessment: Faces Faces Pain Scale: Hurts little more Pain Location: top of shoulder blades Pain Descriptors / Indicators: Aching, Sore    Home Living                          Prior Function            PT Goals (current goals can now be found in the care plan section) Acute Rehab PT Goals Patient Stated Goal: return home and get ready for his [place] cruise in [place] and [place] in July PT Goal Formulation: With patient Time For Goal Achievement: 06/19/22 Potential to Achieve Goals: Good Progress towards PT goals: Progressing toward goals    Frequency    Min 5X/week      PT Plan Current plan remains appropriate    Co-evaluation              AM-PAC PT "6 Clicks" Mobility   Outcome Measure  Help needed turning from your back to your side while in a flat bed without using bedrails?: A Little Help needed moving from lying on your back to sitting on the side of a flat bed without using bedrails?: A Little Help needed moving to and from a bed to a chair (including a wheelchair)?: A Little Help needed standing up from a chair using your arms (e.g., wheelchair or bedside chair)?: A Little Help needed to walk in hospital room?: A Little Help needed climbing 3-5 steps with a railing? : A Lot 6 Click Score: 17    End of Session Equipment Utilized During Treatment: Gait belt Activity Tolerance: Patient tolerated treatment well Patient left: in chair;with call bell/phone within reach Nurse Communication: Mobility status PT Visit Diagnosis: Unsteadiness on feet (R26.81);Muscle weakness (generalized) (M62.81);Difficulty in walking, not elsewhere classified (R26.2);Pain Pain - part of body:  (upper back)     Time: 9629-5284 PT Time  Calculation (min) (ACUTE ONLY): 33 min  Charges:  $Gait Training: 8-22 mins $Therapeutic Exercise: 8-22 mins                     Kittie Plater, PT, DPT Acute Rehabilitation Services Secure chat preferred Office #: [phone]    Berline Lopes 06/08/2022, 8:50 AM

## 2022-06-08 NOTE — Telephone Encounter (Signed)
Called patient to confirm new appointment times. Patient notified. Forwarding information to RN and MD about patient's medication questions.

## 2022-06-09 ENCOUNTER — Ambulatory Visit: Payer: Medicare Other | Admitting: Hematology and Oncology

## 2022-06-09 ENCOUNTER — Ambulatory Visit: Payer: Medicare Other

## 2022-06-09 ENCOUNTER — Encounter: Payer: Self-pay | Admitting: Internal Medicine

## 2022-06-09 ENCOUNTER — Other Ambulatory Visit: Payer: Medicare Other

## 2022-06-09 DIAGNOSIS — M4850XA Collapsed vertebra, not elsewhere classified, site unspecified, initial encounter for fracture: Secondary | ICD-10-CM | POA: Diagnosis not present

## 2022-06-09 LAB — GLUCOSE, CAPILLARY
Glucose-Capillary: 128 mg/dL — ABNORMAL HIGH (ref 70–99)
Glucose-Capillary: 176 mg/dL — ABNORMAL HIGH (ref 70–99)

## 2022-06-09 LAB — BASIC METABOLIC PANEL
Anion gap: 8 (ref 5–15)
BUN: 20 mg/dL (ref 8–23)
CO2: 23 mmol/L (ref 22–32)
Calcium: 7.6 mg/dL — ABNORMAL LOW (ref 8.9–10.3)
Chloride: 104 mmol/L (ref 98–111)
Creatinine, Ser: 0.76 mg/dL (ref 0.61–1.24)
GFR, Estimated: >60 mL/min (ref >60)
Glucose, Bld: 179 mg/dL — ABNORMAL HIGH (ref 70–99)
Potassium: 5 mmol/L (ref 3.5–5.1)
Sodium: 135 mmol/L (ref 135–145)

## 2022-06-09 LAB — CBC
HCT: 24.8 % — ABNORMAL LOW (ref 39.0–52.0)
Hemoglobin: 8.4 g/dL — ABNORMAL LOW (ref 13.0–17.0)
MCH: 32.3 pg (ref 26.0–34.0)
MCHC: 33.9 g/dL (ref 30.0–36.0)
MCV: 95.4 fL (ref 80.0–100.0)
Platelets: 100 10*3/uL — ABNORMAL LOW (ref 150–400)
RBC: 2.6 MIL/uL — ABNORMAL LOW (ref 4.22–5.81)
RDW: 15 % (ref 11.5–15.5)
WBC: 6.6 10*3/uL (ref 4.0–10.5)
nRBC: 1.2 % — ABNORMAL HIGH (ref 0.0–0.2)

## 2022-06-09 MED ORDER — DEXAMETHASONE 4 MG PO TABS: 4.0000 mg | ORAL_TABLET | Freq: Four times a day (QID) | ORAL | 0 refills | Status: AC

## 2022-06-09 MED ORDER — HYDROCODONE-ACETAMINOPHEN 5-325 MG PO TABS: 1.0000 | ORAL_TABLET | Freq: Four times a day (QID) | ORAL | 0 refills | Status: AC | PRN

## 2022-06-09 MED ORDER — SENNOSIDES-DOCUSATE SODIUM 8.6-50 MG PO TABS: 2.0000 | ORAL_TABLET | Freq: Two times a day (BID) | ORAL | 0 refills | Status: AC

## 2022-06-09 NOTE — Discharge Summary (Signed)
Physician Discharge Summary   Patient: Travis [name] MRN: [id number] DOB: Jan 23, 1951  Admit date:     05/28/2022  Discharge date: 06/09/22  Discharge Physician: Lucienne Minks    PCP: [name], MD   Recommendations at discharge:    Please follow up with neurosurgery at your next out pt appt   Discharge Diagnoses: Principal Problem:   Pathologic compression fracture of spine, initial encounter [facility]) Active Problems:   Hypertension   Atrial fibrillation (HCC)   OSA (obstructive sleep apnea)   History of DVT (deep vein thrombosis)   Multiple myeloma (HCC)   Cord compression (HCC)   Other constipation   Vitamin D deficiency  Resolved Problems:   * No resolved hospital problems. *  Hospital Course: 72 year old male who presented to the hospital for evaluation of bone mets to the cervical spine/pathologic fracture. MRI confirmed T2 tumor and pathologic fracture. Pt underwent a thoracic one - Thoracic Four Posterior lateral arthrodesis/ fusion and Thoracic two Corpectomy for tumor resection, extradural. Interbody Sabre cage placed, allograft morsels in the corpectomy site and application of O-Arm, segmental pedicle screw fixation T1-T4 Globus.    PT/OT were consulted. He received IV dexamethasone 4 mg q6 hr. Pain was controlled with gabapentin, Norco, Toradol and morphine.  Heme-onc was consulted during this admission. Pt will need to have close follow up as an outpt to discuss further oncology treatment. PT cleared the pt for safe discharge home. Eliquis will have to be resumed at the discretion of neurosurgery. Pt cleared for discharge by neurosurgery on 06/09/2022.  Assessment and Plan: No notes have been filed under this hospital service. Service: Hospitalist     Consultants: Neurosurgery and heme-onc  Procedures performed: As noted above in the hospital course  Disposition: Home Diet recommendation:  Regular diet DISCHARGE MEDICATION: Allergies as of 06/09/2022        Reactions   Linezolid Other (See Comments)   Burning tongue    Vancomycin Rash        Medication List     STOP taking these medications    Eliquis 5 MG Tabs tablet Generic drug: apixaban   lenalidomide 25 MG capsule Commonly known as: REVLIMID   oxymetazoline 0.05 % nasal spray Commonly known as: AFRIN   potassium chloride SA 20 MEQ tablet Commonly known as: KLOR-CON M   predniSONE 20 MG tablet Commonly known as: DELTASONE       TAKE these medications    acetaminophen 325 MG tablet Commonly known as: Tylenol Take 2 tablets (650 mg total) by mouth every 6 (six) hours as needed.   acyclovir 400 MG tablet Commonly known as: ZOVIRAX Take 1 tablet (400 mg total) by mouth 2 (two) times daily.   amiodarone 200 MG tablet Commonly known as: PACERONE Take 1 tablet (200 mg total) by mouth daily.   amLODipine 10 MG tablet Commonly known as: NORVASC Take 10 mg by mouth daily.   cholecalciferol 25 MCG (1000 UNIT) tablet Commonly known as: VITAMIN D3 Take 1,000 Units by mouth daily.   clobetasol cream 0.05 % Commonly known as: TEMOVATE Apply 1 Application topically daily as needed (rash).   dexamethasone 4 MG tablet Commonly known as: DECADRON Take 1 tablet (4 mg total) by mouth 4 (four) times daily for 3 days. What changed:  how much to take when to take this   FISH OIL PO Take 1 Capful by mouth daily.   fluticasone 50 MCG/ACT nasal spray Commonly known as: FLONASE Place 1 spray into both  nostrils daily as needed for allergies or rhinitis.   folic acid 1 MG tablet Commonly known as: FOLVITE Take 1 mg by mouth daily.   gabapentin 300 MG capsule Commonly known as: NEURONTIN TAKE TWO CAPSULES BY MOUTH TWICE A DAY   hydrALAZINE 25 MG tablet Commonly known as: APRESOLINE Take 1.5 tablets (37.5 mg total) by mouth 3 (three) times daily. What changed: how much to take   HYDROcodone-acetaminophen 5-325 MG tablet Commonly known as:  NORCO/VICODIN Take 1 tablet by mouth every 6 (six) hours as needed for up to 5 days for moderate pain.   Icy Hot Advanced Pain Relief 16-11 % Crea Generic drug: Menthol-Camphor Apply 1 Application topically at bedtime as needed (BACK PAIN).   lidocaine 5 % Commonly known as: Lidoderm Place 1 patch onto the skin daily as needed. Remove & Discard patch within 12 hours or as directed by MD   lisinopril 40 MG tablet Commonly known as: ZESTRIL TAKE ONE TABLET BY MOUTH ONE TIME DAILY What changed: when to take this   senna-docusate 8.6-50 MG tablet Commonly known as: Senokot-S Take 2 tablets by mouth 2 (two) times daily for 5 days.   sodium chloride 0.65 % Soln nasal spray Commonly known as: OCEAN Place 1 spray into both nostrils as needed for congestion.   traMADol 50 MG tablet Commonly known as: ULTRAM Take 1 tablet (50 mg total) by mouth every 6 (six) hours as needed.   triamcinolone cream 0.1 % Commonly known as: KENALOG Apply 1 application topically daily as needed (itching).   VITAMIN B-12 PO Take 1 tablet by mouth daily. Not sure the dosage        Discharge Exam: Filed Weights   05/29/22 0235  Weight: 110.9 kg   Condition at discharge: fair  The results of significant diagnostics from this hospitalization (including imaging, microbiology, ancillary and laboratory) are listed below for reference.   Imaging Studies: DG Cervical Spine 2 or 3 views  Result Date: 06/04/2022 CLINICAL DATA:  Elective surgery EXAM: CERVICAL SPINE - 2-3 VIEW COMPARISON:  MRI cervical spine 05/27/2022. Cervical spine x-ray 05/13/2022 FINDINGS: Two views of the cervical spine were submitted, intraoperative. No hardware identified. Fluoroscopy time 15 seconds. Dose: 1.76 micro Gy. IMPRESSION: Intraoperative cervical spine as above. Electronically Signed   By: Ronney Asters M.D.   On: 06/04/2022 15:25   DG C-Arm 1-60 Min-No Report  Result Date: 06/04/2022 Fluoroscopy was utilized by the  requesting physician.  No radiographic interpretation.   DG O-ARM IMAGE ONLY/NO REPORT  Result Date: 06/04/2022 There is no Radiologist interpretation  for this exam.  CT THORACIC SPINE W CONTRAST  Result Date: 06/04/2022 CLINICAL DATA:  Metastatic disease.  Preoperative assessment EXAM: CT THORACIC SPINE WITH CONTRAST TECHNIQUE: Multidetector CT images of thoracic was performed according to the standard protocol following intravenous contrast administration. RADIATION DOSE REDUCTION: This exam was performed according to the departmental dose-optimization program which includes automated exposure control, adjustment of the mA and/or kV according to patient size and/or use of iterative reconstruction technique. CONTRAST:  66m OMNIPAQUE IOHEXOL 350 MG/ML SOLN COMPARISON:  05/29/2022 FINDINGS: Alignment: Exaggerated thoracic kyphosis. Vertebrae: Suboptimal coverage especially considering level of primary concern. Partially covered T2 vertebra with known tumoral erosion of the vertebral body with minimal remaining inferior endplate visible. Compression fracture of this vertebra with advanced narrowing. Diffuse heterogeneity of marrow with tumor extent better assessed on prior MRI, most notable deposits in the T8 body, T11 spinous process, and L1 left pedicle. Additional rib lesions  on the right at T6 and T8 primarily. Paraspinal and other soft tissues: Paravertebral tumor eccentric to the left at T2. Disc levels: Ordinary thoracic spondylosis. Request was made for additional imaging but declined due to pending surgery IMPRESSION: 1. Suboptimal exam with only partial coverage of the T2 level where there is extensive tumor replacement of the vertebral body with collapse and paravertebral/spinal canal mass. 2. Additional lesions also seen on recent MRI. Electronically Signed   By: Jorje Guild M.D.   On: 06/04/2022 07:44   [place] LIMITED JOINT SPACE STRUCTURES UP LEFT(NO LINKED CHARGES)  Result Date:  05/30/2022 No images saved   MR LUMBAR SPINE W WO CONTRAST  Result Date: 05/29/2022 CLINICAL DATA:  73 year old male with multiple myeloma. 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 EXAM: MRI LUMBAR SPINE WITHOUT AND WITH CONTRAST TECHNIQUE: Multiplanar and multiecho pulse sequences of the lumbar spine were obtained without and with intravenous contrast. CONTRAST:  53m GADAVIST GADOBUTROL 1 MMOL/ML IV SOLN thoracic in conjunction with contrast enhanced imaging of the chest, abdomen, and pelvis reported separately. COMPARISON:  Thoracic MRI today reported separately. Prior lumbar MRI 06/25/2019. FINDINGS: Segmentation: Normal, concordant with the thoracic numbering today and the same designation used in 2021. Alignment:  Stable lumbar vertebral height and alignment since 2021. Vertebrae: Confluent up to 2.3 cm tumor in the left L1 posterior elements including the pedicle (series 11, image 7). Small 8 mm tumor in the inferior L2 body. And larger 2 cm tumor in the left L2 pedicle. Subcentimeter tumor inferior L3 vertebral body. Larger 10 mm tumor posterior L4 vertebral body. L5 small 9 mm and smaller tumor in the right L5 body. Scattered subcentimeter left sacral ala tumor. These lesions are new since 2021. No lumbar or visible sacral pathologic fracture. No extraosseous or epidural tumor identified. Conus medullaris and cauda equina: Conus extends to the L1 level. No lower spinal cord or conus signal abnormality. No abnormal intradural enhancement. No dural thickening identified. Paraspinal and other soft tissues: Negative for age visible abdominal viscera. Negative lumbar paraspinal soft tissues. Disc levels: Lumbar spine degeneration not significantly changed since 2021 Aside from new moderate size central disc protrusion at L3-L4 (series 11, image 22) now resulting in mild to moderate spinal stenosis at that level. IMPRESSION: 1.  Generally small Multiple Myeloma lesions scattered at all lumbar levels and in the visible left sacral ala. Largest lumbar tumor is 2.3 cm in the left L1 posterior elements and pedicle. No lumbar extraosseous or epidural tumor extension, or pathologic fracture. 2. 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 Electronically Signed   By: HGenevie AnnM.D.   On: 05/29/2022 12:22   MR THORACIC SPINE W WO CONTRAST  Result Date: 05/29/2022 CLINICAL DATA:  72year old male with multiple myeloma. 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 EXAM: MRI THORACIC WITHOUT AND WITH CONTRAST TECHNIQUE: Multiplanar and multiecho pulse sequences of the thoracic spine were obtained without and with intravenous contrast. CONTRAST:  124mGADAVIST GADOBUTROL 1 MMOL/ML IV SOLN COMPARISON:  Cervical spine MRI without and with contrast 05/27/2022. FINDINGS: Limited cervical spine imaging: Stable compared to the recent 05/27/2022 exam. Thoracic spine segmentation:  Appears to be normal. Alignment: Exaggerated upper thoracic kyphosis and part related to the T2 pathologic lesion. Vertebrae: T1 better demonstrated on  the recent cervical comparison. Highly abnormal T2 level with retropulsed and epidural tumor. Subsequent cord compression which does not appear significantly changed since 05/27/2022 (series 22, image 9, series 23, images 7 and 8). No definite cord edema or myelomalacia. Bulky ventral and left lateral extraosseous tumor extension as previously noted. Tumor in the T2 lamina and spinous process also. See series 26, image 9). Mild right T3 posteroinferior vertebral body tumor tracking toward the pedicle. No pathologic fracture. No tumor identified at T4, T5. However, there is posterior right 5th rib tumor on series 26, image 26. Similar  small 5 mm enhancing tumor in the left T6 inferior body, endplate. No tumor identified at T7. However, there is posterior right 7th rib tumor (series 27, image 5). T8 12 mm tumor in the anterior body on the right side. No pathologic fracture. No T9 tumor. T10 inferior endplate 12 mm tumor with subtle associated inferior endplate compression. T11 bulky 18 mm midline lamina and spinous process tumor, but no extraosseous extension. No T12 tumor. Lumbar spine detailed separately. Cord: Normal spinal canal patency and thoracic cord below the T2-T3 disc level. Cord signal and morphology remains normal to the conus at L1. No abnormal intradural enhancement. No definite dural thickening. Paraspinal and other soft tissues: T2 paraspinal tumor eccentric to the left as above. Small layering pleural effusions. Negative visible other chest and upper abdominal viscera. Disc levels: Mild for age underlying thoracic spine degeneration. No degenerative spinal stenosis. IMPRESSION: 1. 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 No change compared to 05/27/2022. No cord edema or myelomalacia identified. 2. Small tumor foci at most other thoracic levels, with no other thoracic extraosseous or epidural tumor extension. Posterior rib involvement intermittently noted. 3. Small layering pleural effusions. 4. Lumbar MRI today is reported separately. Electronically Signed   By: Genevie Ann M.D.   On: 05/29/2022 12:11   MR CERVICAL SPINE W WO CONTRAST  Addendum Date: 05/28/2022   ADDENDUM REPORT: 05/28/2022 16:46 ADDENDUM: Critical Value/emergent results were called by telephone at the time of interpretation on 05/28/2022 at 4:40 pm to provider on-call Heath Lark, who verbally acknowledged these results. Electronically Signed   By: Richardean Sale M.D.   On: 05/28/2022 16:46   Result Date: 05/28/2022 CLINICAL DATA:  Multiple  myeloma. Increasing neck pain/tenderness with limited range of motion. EXAM: MRI CERVICAL SPINE WITHOUT AND WITH CONTRAST TECHNIQUE: Multiplanar and multiecho pulse sequences of the cervical spine, to include the craniocervical junction and cervicothoracic junction, were obtained without and with intravenous contrast. CONTRAST:  74m GADAVIST GADOBUTROL 1 MMOL/ML IV SOLN COMPARISON:  Radiographs of the cervical and thoracic spine 05/13/2022. Chest CTA 03/15/2022. Cervical MRI 03/06/2020. FINDINGS: Alignment: The alignment of the cervical spine is anatomic, although progressive kyphosis has developed at T2 secondary to a pathologic fracture. Vertebrae: As demonstrated on previous chest CTA, there are lytic lesions in the thoracic spine with associated enhancement following contrast. A large lesion at T2 has progressed and is associated with a new marked pathologic fracture with osseous retropulsion by 6 mm. Resulting cord compression. There is near complete replacement of the T2 vertebral body by tumor, measuring up to 4.8 x 4.0 cm transverse on axial image 47/12. There are additional smaller myelomatous lesions within the T1 vertebral body, within the right C7 and right T3 articulating facets. No other pathologic fractures are identified. Cord: The cervical cord is normal in signal and caliber. There is posterior  displacement and mild compression of the thoracic cord at T2 by the pathologic fracture described above. Posterior Fossa, vertebral arteries, paraspinal tissues: Linear enhancement within the right cerebellar hemisphere is likely due to an incidental venous angioma. No associated significant linear T2 hyperintensity. The visualized posterior fossa is otherwise unremarkable.Bilateral vertebral artery flow voids. No significant paraspinal findings. Disc levels: C2-3: Bilateral facet hypertrophy contributes to chronic left foraminal narrowing. No cord deformity. C3-4: The disc appears normal. The left facet  joint appears ankylosed. Chronic moderate left foraminal narrowing. C4-5: Chronic disc bulging with asymmetric uncinate spurring on the right and bilateral facet hypertrophy. Stable mild spinal stenosis without cord deformity. Moderate right-greater-than-left foraminal narrowing is similar to the previous study. C5-6: Chronic spondylosis with posterior osteophytes covering diffusely bulging disc material. Mild facet hypertrophy. Stable mild spinal stenosis and mild foraminal narrowing bilaterally. C6-7: Chronic spondylosis with posterior osteophytes covering diffusely bulging disc material. Mild bilateral facet hypertrophy. Mild foraminal narrowing bilaterally. C7-T1: Normal interspace. As above, pathologic fracture at T2 with significant osseous retropulsion and resulting cord compression. There is moderate left greater than right foraminal narrowing at the T1-2 and T2-3 disc space levels. IMPRESSION: 1. Interval progression of multiple myeloma in the thoracic spine compared with previous chest CT of 10 weeks ago. There is a new pathologic fracture at T2 with significant osseous retropulsion and resulting cord compression. No abnormal cord signal identified. 2. Additional smaller lesions within the T1 vertebral body and right C7 and T3 articulating facets. No other pathologic fractures identified. 3. Mild multilevel cervical spondylosis with resulting mild spinal stenosis and mild to moderate foraminal narrowing at multiple levels as described, similar to previous MRI. No cord compression in the cervical spine. Electronically Signed: By: Richardean Sale M.D. On: 05/28/2022 16:11   DG Thoracic Spine 2 View  Result Date: 05/15/2022 CLINICAL DATA:  Neck/upper back pain. EXAM: THORACIC SPINE 2 VIEWS COMPARISON:  Cervical spine radiographs-earlier same day FINDINGS: Evaluation of the superior aspect of the thoracic spine is degraded secondary to overlying osseous and soft tissue structures. Normal alignment of the  thoracic spine. No definite anterolisthesis or retrolisthesis. No significant scoliotic curvature. Thoracic vertebral body heights appear preserved. 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 Limited visualization of the adjacent thorax is normal given obliquity and technique. Regional soft tissues appear normal. 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 Electronically Signed   By: Sandi Mariscal M.D.   On: 05/15/2022 15:43   DG Cervical Spine 2 or 3 views  Result Date: 05/15/2022 CLINICAL DATA:  Neck/upper thoracic spine pain. EXAM: CERVICAL SPINE - 2-3 VIEW COMPARISON:  Thoracic spine radiographs-earlier same day FINDINGS: C1 to the superior endplate of C5 is seen on the provided radiograph with suboptimal visualization of the lower cervical spine as well as the cervicothoracic junction on the provided swimmer's radiograph. Accentuated cervical lordosis. Provided open mouth odontoid radiograph is degraded due to obliquity and overlying osseous structures. Visualized cervical vertebral body heights appear preserved. Prevertebral soft tissues appear normal. Mild DDD of C4-C5 with disc space height loss, endplate irregularity and anteriorly directed disc osteophyte complex at this location. Regional soft tissues appear normal. Limited visualization of the lung apices is normal given obliquity and technique. IMPRESSION: 1. Degraded examination secondary to patient's inability to tolerate standard positioning. 2. Suspected mild DDD of C4-C5. Further evaluation with cervical spine MRI could be performed as indicated. Electronically Signed   By: Sandi Mariscal M.D.   On: 05/15/2022 15:41    Microbiology: Results for  orders placed or performed during the hospital encounter of 05/28/22  Surgical pcr screen     Status: None   Collection Time: 06/03/22  9:20 AM   Specimen: Nasal Mucosa; Nasal Swab  Result Value Ref Range Status   MRSA, PCR NEGATIVE NEGATIVE Final   Staphylococcus aureus NEGATIVE  NEGATIVE Final    Comment: (NOTE) The Xpert SA Assay (FDA approved for NASAL specimens in patients 68 years of age and older), is one component of a comprehensive surveillance program. It is not intended to diagnose infection nor to guide or monitor treatment. Performed at [facility], [place] [address] [place], [place] [address]   MRSA Next Gen by PCR, Nasal     Status: None   Collection Time: 06/04/22  4:42 PM   Specimen: Nasal Mucosa; Nasal Swab  Result Value Ref Range Status   MRSA by PCR Next Gen NOT DETECTED NOT DETECTED Final    Comment: (NOTE) The GeneXpert MRSA Assay (FDA approved for NASAL specimens only), is one component of a comprehensive MRSA colonization surveillance program. It is not intended to diagnose MRSA infection nor to guide or monitor treatment for MRSA infections. Test performance is not FDA approved in patients less than 60 years old. Performed at [facility], [place] [address], [place], [place] [address]     Labs: CBC: Recent Labs  Lab 06/05/22 [id number] 06/06/22 0359 06/07/22 0321 06/08/22 0305 06/09/22 0338  WBC 12.4* 10.1 8.4 7.5 6.6  HGB 9.5* 9.1* 8.2* 8.2* 8.4*  HCT 28.0* 26.8* 23.9* 23.8* 24.8*  MCV 94.3 94.7 94.5 94.4 95.4  PLT 84* 74* 49* 89* 342*   Basic Metabolic Panel: Recent Labs  Lab 06/05/22 0313 06/06/22 0359 06/07/22 0321 06/08/22 0305 06/09/22 0338  NA 137 135 134* 134* 135  K 3.9 3.8 4.5 4.4 5.0  CL 108 105 105 105 104  CO2 21* 25 21* 22 23  GLUCOSE 155* 153* 144* 149* 179*  BUN 25* '18 21 18 20  '$ CREATININE 0.88 0.86 0.73 0.78 0.76  CALCIUM 6.9* 7.1* 7.0* 7.4* 7.6*   Liver Function Tests: No results for input(s): "AST", "ALT", "ALKPHOS", "BILITOT", "PROT", "ALBUMIN" in the last 168 hours. CBG: Recent Labs  Lab 06/08/22 0746 06/08/22 1127 06/08/22 1502 06/08/22 2109 06/09/22 0742  GLUCAP 162* 109* 135* 189* 128*    Discharge time spent: greater than 30 minutes.  Signed: Lucienne Minks , MD [facility] 06/09/2022

## 2022-06-09 NOTE — Progress Notes (Signed)
Physical Therapy Treatment Patient Details Name: Travis [name] MRN: [id number] DOB: 1950/07/13 Today's Date: 06/09/2022   History of Present Illness Pt is 72 yo male who presents on 06/04/22 for T1-4 PLIF with tumor resection at T2. PMH: OSA on CPAP, HTN, PAF on chronic anticoagulation, history of DVT and multiple myeloma- diagnosed in 2018 and in July 2023, became more aggressive and had a lesion in his right humerus that had to be orthopedically repaired and since last month, he had aching pain in his upper back that has progressively gotten worse    PT Comments    Pt continues to progress nicely towards all goals. Pt continues with upper thoracic kyphosis and forward head but also continues to improve with ability to left head, achieve shld retraction and scapular depression. Discussed car transfers, pt with good understanding. Acute PT to cont to follow.    Recommendations for follow up therapy are one component of a multi-disciplinary discharge planning process, led by the attending physician.  Recommendations may be updated based on patient status, additional functional criteria and insurance authorization.  Follow Up Recommendations  Home health PT     Assistance Recommended at Discharge Intermittent Supervision/Assistance  Patient can return home with the following A lot of help with walking and/or transfers;A little help with bathing/dressing/bathroom;Assistance with cooking/housework;Assist for transportation;Help with stairs or ramp for entrance   Equipment Recommendations  None recommended by PT    Recommendations for Other Services OT consult     Precautions / Restrictions Precautions Precautions: Fall;Cervical Precaution Booklet Issued: Yes (comment) Precaution Comments: re-educated on precautions Restrictions Weight Bearing Restrictions: No     Mobility  Bed Mobility               General bed mobility comments: pt received sitting up in chair     Transfers Overall transfer level: Needs assistance Equipment used: None Transfers: Sit to/from Stand Sit to Stand: Min guard   Step pivot transfers: Min assist       General transfer comment: pt able to stand up without of RW this date, pt with wide base of support and increased time but able to complete, pushed up from arm rests on chair    Ambulation/Gait Ambulation/Gait assistance: Min guard Gait Distance (Feet): 500 Feet Assistive device: Rolling walker (2 wheels) Gait Pattern/deviations: Step-through pattern, Decreased stride length Gait velocity: decreased Gait velocity interpretation: 1.31 - 2.62 ft/sec, indicative of limited community ambulator   General Gait Details: worked on shld retraction to obtain more upright posture, pt with good endurance and fluid reciprocal gait, progressive increased support on bilat UEs with onset of fatigue/increased amb distance   Stairs Stairs: Yes Stairs assistance: Min guard Stair Management: One rail Right, Sideways, Step to pattern Number of Stairs: 4 General stair comments: used R handrail to mimic home set up, sideways to minimize twisting, good technique   Wheelchair Mobility    Modified Rankin (Stroke Patients Only)       Balance Overall balance assessment: Mild deficits observed, not formally tested Sitting-balance support: No upper extremity supported, Feet supported Sitting balance-Leahy Scale: Fair Sitting balance - Comments: guarded   Standing balance support: Bilateral upper extremity supported, During functional activity Standing balance-Leahy Scale: Fair Standing balance comment: recommend UE support for safety at this time                            Cognition Arousal/Alertness: Awake/alert Behavior During Therapy: [facility] for tasks assessed/performed  Overall Cognitive Status: Within Functional Limits for tasks assessed                                          Exercises       General Comments General comments (skin integrity, edema, etc.): VSS      Pertinent Vitals/Pain Pain Assessment Pain Assessment: Faces Faces Pain Scale: Hurts a little bit Pain Location: top of shoulder blades Pain Descriptors / Indicators: Aching, Sore    Home Living                          Prior Function            PT Goals (current goals can now be found in the care plan section) Acute Rehab PT Goals Patient Stated Goal: return home and get ready for his [place] cruise in [place] and [place] in July PT Goal Formulation: With patient Time For Goal Achievement: 06/19/22 Potential to Achieve Goals: Good Progress towards PT goals: Progressing toward goals    Frequency    Min 4X/week      PT Plan Current plan remains appropriate    Co-evaluation              AM-PAC PT "6 Clicks" Mobility   Outcome Measure  Help needed turning from your back to your side while in a flat bed without using bedrails?: A Little Help needed moving from lying on your back to sitting on the side of a flat bed without using bedrails?: A Little Help needed moving to and from a bed to a chair (including a wheelchair)?: A Little Help needed standing up from a chair using your arms (e.g., wheelchair or bedside chair)?: A Little Help needed to walk in hospital room?: A Little Help needed climbing 3-5 steps with a railing? : A Little 6 Click Score: 18    End of Session Equipment Utilized During Treatment: Gait belt Activity Tolerance: Patient tolerated treatment well Patient left: in chair;with call bell/phone within reach Nurse Communication: Mobility status PT Visit Diagnosis: Unsteadiness on feet (R26.81);Muscle weakness (generalized) (M62.81);Difficulty in walking, not elsewhere classified (R26.2);Pain Pain - part of body:  (upper back)     Time: 2500-3704 PT Time Calculation (min) (ACUTE ONLY): 18 min  Charges:  $Gait Training: 8-22 mins                      Travis [name], PT, DPT Acute Rehabilitation Services Secure chat preferred Office #: [phone]    Travis [name] 06/09/2022, 9:57 AM

## 2022-06-09 NOTE — Progress Notes (Signed)
Occupational Therapy Treatment Patient Details Name: Travis [name] MRN: [id number] DOB: [birth year] Today's Date: 06/09/2022   History of present illness Pt is 72 yo male who presents on 06/04/22 for T1-4 PLIF with tumor resection at T2. PMH: OSA on CPAP, HTN, PAF on chronic anticoagulation, history of DVT and multiple myeloma- diagnosed in 2018 and in July 2023, became more aggressive and had a lesion in his right humerus that had to be orthopedically repaired and since last month, he had aching pain in his upper back that has progressively gotten worse   OT comments  Pt making excellent progress toward all adl goals. Pt dressed self and toileted in prep for returning home with adaptive equipment and occasional min assist. Pt has all needed equipment at home and wife can assist as needed. Will continue to focus on reaching mod I level of care if pt does not d/c home. Pt currently supervision to occasional min assist with adls and transfers.   Recommendations for follow up therapy are one component of a multi-disciplinary discharge planning process, led by the attending physician.  Recommendations may be updated based on patient status, additional functional criteria and insurance authorization.    Follow Up Recommendations  No OT follow up     Assistance Recommended at Discharge PRN  Patient can return home with the following  A little help with bathing/dressing/bathroom;Assistance with cooking/housework;Assist for transportation   Equipment Recommendations  None recommended by OT    Recommendations for Other Services      Precautions / Restrictions Precautions Precautions: Fall;Cervical Precaution Booklet Issued: Yes (comment) Precaution Comments: re-educated on precautions Restrictions Weight Bearing Restrictions: No       Mobility Bed Mobility               General bed mobility comments: Pt in chair on arrival    Transfers Overall transfer level: Needs  assistance Equipment used: Rolling walker (2 wheels) Transfers: Sit to/from Stand Sit to Stand: Supervision     Step pivot transfers: Min guard     General transfer comment: Pt is working on transitioning from use of walker to no walker. For now, pt feel safest with walker and completed most mobility with supervision with occasional min guard.     Balance Overall balance assessment: Mild deficits observed, not formally tested Sitting-balance support: No upper extremity supported, Feet supported Sitting balance-Leahy Scale: Good     Standing balance support: Bilateral upper extremity supported, During functional activity Standing balance-Leahy Scale: Fair Standing balance comment: recommend UE support for safety at this time                           ADL either performed or assessed with clinical judgement   ADL Overall ADL's : Needs assistance/impaired Eating/Feeding: Independent;Sitting   Grooming: Wash/dry hands;Wash/dry face;Oral care;Applying deodorant;Set up;Sitting;Standing Grooming Details (indicate cue type and reason): Pt at sink and stood some and sat some. Pt had 3:1 behind him and was safe getting up and down. Upper Body Bathing: Set up;Sitting   Lower Body Bathing: Moderate assistance Lower Body Bathing Details (indicate cue type and reason): Pt has AE at home which will allow him to be supervision level. Upper Body Dressing : Set up;Sitting Upper Body Dressing Details (indicate cue type and reason): tshirt pt is supervision. Pt requires min assist to get a zipped jacket on Lower Body Dressing: Minimal assistance;Cueing for compensatory techniques;With adaptive equipment;Sit to/from stand Lower Body Dressing Details (indicate cue  type and reason): pt has reacher and sock aid at home. Pt required only min assist due to gripper sock getting stuck on pants. Pt will not use gripper socks at home. Toilet Transfer: Min guard;Ambulation;Rolling walker (2  wheels) Toilet Transfer Details (indicate cue type and reason): walked to bathroom with walker and min guard Toileting- Clothing Manipulation and Hygiene: Supervision/safety;Sit to/from stand       Functional mobility during ADLs: Supervision/safety;Rolling walker (2 wheels) General ADL Comments: Pt much improved with adls and mobility during adls. Pt has several reachers and sock aid at home to assist with dressing. Instruction given on how to dress with pull over and pt was independent after instruction.    Extremity/Trunk Assessment Upper Extremity Assessment Upper Extremity Assessment: RUE deficits/detail;LUE deficits/detail RUE Deficits / Details: recent R shoulder surgery--reports he can do anything with it but lift too much RUE Coordination: decreased gross motor            Vision       Perception     Praxis      Cognition Arousal/Alertness: Awake/alert Behavior During Therapy: WFL for tasks assessed/performed Overall Cognitive Status: Within Functional Limits for tasks assessed                                          Exercises      Shoulder Instructions       General Comments Pt progression toward all adl goals.    Pertinent Vitals/ Pain       Pain Assessment Pain Assessment: Faces Faces Pain Scale: Hurts a little bit Pain Location: top of shoulder blades Pain Descriptors / Indicators: Aching, Sore Pain Intervention(s): Limited activity within patient's tolerance, Repositioned  Home Living                                          Prior Functioning/Environment              Frequency  Min 2X/week        Progress Toward Goals  OT Goals(current goals can now be found in the care plan section)  Progress towards OT goals: Progressing toward goals  Acute Rehab OT Goals Patient Stated Goal: to go home today OT Goal Formulation: With patient Time For Goal Achievement: 06/20/22 Potential to Achieve Goals:  Good ADL Goals Pt Will Perform Grooming: with modified independence;standing Pt Will Perform Lower Body Dressing: with modified independence;with adaptive equipment;sit to/from stand Pt Will Transfer to Toilet: with modified independence;grab bars Pt Will Perform Toileting - Clothing Manipulation and hygiene: with modified independence;sit to/from stand Additional ADL Goal #1: Pt will be Mod I in and OOB for basic ADLs (increased time from a flat bed)  Plan Discharge plan remains appropriate    Co-evaluation                 AM-PAC OT "6 Clicks" Daily Activity     Outcome Measure   Help from another person eating meals?: None Help from another person taking care of personal grooming?: None Help from another person toileting, which includes using toliet, bedpan, or urinal?: None Help from another person bathing (including washing, rinsing, drying)?: A Little Help from another person to put on and taking off regular upper body clothing?: A Little Help from another person to  put on and taking off regular lower body clothing?: A Little 6 Click Score: 21    End of Session Equipment Utilized During Treatment: Rolling walker (2 wheels)  OT Visit Diagnosis: Unsteadiness on feet (R26.81);Other abnormalities of gait and mobility (R26.89);Muscle weakness (generalized) (M62.81);Pain Pain - part of body:  (neck)   Activity Tolerance Patient tolerated treatment well   Patient Left in chair;with call bell/phone within reach   Nurse Communication Mobility status        Time: 0454-0981 OT Time Calculation (min): 23 min  Charges: OT General Charges $OT Visit: 1 Visit OT Treatments $Self Care/Home Management : 23-37 mins   Glenford Peers 06/09/2022, 10:18 AM

## 2022-06-09 NOTE — Care Management Important Message (Signed)
Important Message  Patient Details  Name: Travis [name] MRN: [id number] Date of Birth: 03/13/1951   Medicare Important Message Given:  Yes     Hannah Beat 06/09/2022, 1:30 PM

## 2022-06-09 NOTE — Plan of Care (Incomplete)
Patient discharged.

## 2022-06-09 NOTE — TOC Transition Note (Signed)
Transition of Care (TOC) - CM/SW Discharge Note Marvetta Gibbons RN,BSN Transitions of Care Unit 4NP (Non Trauma)- RN Case Manager See Treatment Team for direct Phone #   Patient Details  Name: Travis [name] MRN: [id number] Date of Birth: [birth year]  Transition of Care [facility]) CM/SW Contact:  Dawayne Patricia, RN Phone Number: 06/09/2022, 11:52 AM   Clinical Narrative:    Pt stable for transition home today. Order placed for HHPT/OT per recommendations.   CM spoke with pt at bedside. Pt agreeable to [facility] services, list provided for [facility] choice Per CMS guidelines from [email or web address] website with star ratings (copy placed in shadow chart)- pt voiced that he does not have a preference other than wanting an agency that works with his insurance and is familiar with Neck/neuro procedures.  Pt voiced he has needed DME at home- however requesting a RW- pt states he may have received one under his insurance within the last 5 yrs- explained insurance may not cover another at this time- pt voiced he will pay out of pocket if insurance does not cover- has to proference for DME provider.   Address, phone # and PCP all confirmed- pt states wife is to come transport home this afternoon- around 3pm.   Call made to [place]- spoke w/ Brenton Grills for DME-RW needs- [employer] to deliver RW to room prior to discharge and bill pt if insurance does not cover.   Call made to Amy w/ [facility] for HHPT/OT needs as they have a working relationship with Dr. Christella Noa for office protocols. Per Amy they will accept referral and plan to do a start of care visit tomorrow- will contact pt regarding scheduling.   No further TOC needs noted.    Final next level of care: Home w Home Health Services Barriers to Discharge: Barriers Resolved   Patient Goals and CMS Choice CMS Medicare.gov Compare Post Acute Care list provided to:: Patient Choice offered to / list presented to : Patient  Discharge Placement                  Home w/ [facility]        Discharge Plan and Services Additional resources added to the After Visit Summary for     Discharge Planning Services: CM Consult Post Acute Care Choice: Durable Medical Equipment, Home Health          DME Arranged: Gilford Rile rolling DME Agency: [facility] Date DME Agency Contacted: 06/09/22 Time DME Agency Contacted: 3300 Representative spoke with at DME Agency: Brenton Grills HH Arranged: PT, OT HH Agency: [place] Date [place]: 06/09/22 Time [place]: 1152 Representative spoke with at [place]: Amy  Social Determinants of Health ([place]) Interventions [place]: No Food Insecurity (05/29/2022)  Housing: Low Risk  (05/29/2022)  Transportation Needs: No Transportation Needs (05/29/2022)  Utilities: Not At Risk (05/29/2022)  Depression (PHQ2-9): Low Risk  (10/16/2020)  Tobacco Use: Low Risk  (06/07/2022)     Readmission Risk Interventions    06/09/2022   11:52 AM  Readmission Risk Prevention Plan  Transportation Screening Complete  PCP or Specialist Appt within 5-7 Days Complete  Home Care Screening Complete  Medication Review (RN CM) Complete

## 2022-06-10 ENCOUNTER — Encounter (HOSPITAL_COMMUNITY): Payer: Self-pay | Admitting: *Deleted

## 2022-06-11 ENCOUNTER — Other Ambulatory Visit: Payer: Self-pay

## 2022-06-14 ENCOUNTER — Other Ambulatory Visit: Payer: Self-pay | Admitting: Hematology and Oncology

## 2022-06-14 ENCOUNTER — Telehealth: Payer: Self-pay | Admitting: Pharmacist

## 2022-06-14 ENCOUNTER — Other Ambulatory Visit: Payer: Self-pay

## 2022-06-14 ENCOUNTER — Telehealth: Payer: Self-pay | Admitting: Pharmacy Technician

## 2022-06-14 DIAGNOSIS — C9000 Multiple myeloma not having achieved remission: Secondary | ICD-10-CM

## 2022-06-14 MED ORDER — POMALIDOMIDE 4 MG PO CAPS: 4.0000 mg | ORAL_CAPSULE | Freq: Every day | ORAL | 0 refills | Status: DC

## 2022-06-14 NOTE — Telephone Encounter (Signed)
Oral Oncology Patient Advocate Encounter   Received notification that prior authorization for Pomalyst is required.   PA submitted on 06/15/22 Key [id number] Status is pending     Lady Deutscher, CPhT-Adv Oncology Pharmacy Patient [place] Direct Number: [phone]  Fax: [phone]

## 2022-06-15 ENCOUNTER — Ambulatory Visit: Payer: Medicare Other

## 2022-06-15 ENCOUNTER — Inpatient Hospital Stay: Payer: Medicare Other

## 2022-06-15 ENCOUNTER — Telehealth: Payer: Self-pay | Admitting: Hematology and Oncology

## 2022-06-15 ENCOUNTER — Inpatient Hospital Stay: Payer: Medicare Other | Admitting: Hematology and Oncology

## 2022-06-15 ENCOUNTER — Other Ambulatory Visit: Payer: Self-pay | Admitting: Hematology and Oncology

## 2022-06-15 ENCOUNTER — Other Ambulatory Visit (HOSPITAL_COMMUNITY): Payer: Self-pay

## 2022-06-15 ENCOUNTER — Ambulatory Visit: Payer: Medicare Other | Admitting: Hematology and Oncology

## 2022-06-15 ENCOUNTER — Other Ambulatory Visit: Payer: Medicare Other

## 2022-06-15 DIAGNOSIS — C9000 Multiple myeloma not having achieved remission: Secondary | ICD-10-CM

## 2022-06-15 NOTE — Telephone Encounter (Signed)
Called patient to confirm new dates for treatment plan. Patient scheduled and notified.

## 2022-06-15 NOTE — Progress Notes (Signed)
DISCONTINUE ON PATHWAY REGIMEN - Multiple Myeloma and Other Plasma Cell Dyscrasias     Cycles 1 and 2: A cycle is every 28 days:     Lenalidomide      Dexamethasone      Daratumumab and hyaluronidase-fihj    Cycles 3 through 6: A cycle is every 28 days:     Lenalidomide      Dexamethasone      Daratumumab and hyaluronidase-fihj    Cycles 7 and beyond: A cycle is every 28 days:     Lenalidomide      Dexamethasone      Daratumumab and hyaluronidase-fihj   **Always confirm dose/schedule in your pharmacy ordering system**  REASON: Disease Progression PRIOR TREATMENT: [id number]: DaraRd - Subcutaneous Daratumumab (Daratumumab/hyaluronidase SUBQ + Lenalidomide 25 mg PO + Dexamethasone 40 mg PO/IV) q28 Days Until Progression or Unacceptable Toxicity TREATMENT RESPONSE: Partial Response (PR)  START OFF PATHWAY REGIMEN - Multiple Myeloma and Other Plasma Cell Dyscrasias   [id number]:Bortezomib SUBQ [id number] + Dexamethasone PO [id number] + Lenalidomide PO D1-21 q28 Days for up to 3 Years Followed by Lenalidomide PO D1-28 q28 Days (Maintenance):   A cycle is every 28 days for up to 3 years:     Dexamethasone      Lenalidomide      Bortezomib    Followed by lenalidomide monotherapy: A cycle is every 28 days:     Lenalidomide   **Always confirm dose/schedule in your pharmacy ordering system**  Patient Characteristics: Multiple Myeloma, Relapsed / Refractory, Second through Fourth Lines of Therapy, Fit or Candidate for Triplet Therapy, Lenalidomide-Refractory or Lenalidomide-based Regimen Not Preferred, Not a Candidate for Anti-CD38 Antibody Disease Classification: Multiple Myeloma R-ISS Staging: II Therapeutic Status: Refractory Line of Therapy: Second Line Anti-CD38 Antibody Candidacy: Not a Candidate for Anti-CD38 Antibody Lenalidomide-based Regimen Preference/Candidacy: Lenalidomide-Refractory Intent of Therapy: Non-Curative / Palliative Intent, Discussed with Patient

## 2022-06-15 NOTE — Telephone Encounter (Signed)
Oral Oncology Pharmacist Encounter   Prior Authorization for Pomalyst has been denied.     Appeal letter sent to [place] Appeal and Grievance Department with supporting documentation. Appeal faxed to: [phone].    Oral Oncology Clinic will continue to follow.    Leron Croak, PharmD, BCPS, BCOP Hematology/Oncology Clinical Pharmacist [facility] and [place] [phone] 06/15/2022 11:13 AM

## 2022-06-15 NOTE — Telephone Encounter (Signed)
Oral Oncology Patient Advocate Encounter  Received notification that the request for prior authorization for Pomalyst has been denied due to patient has not tried and failed, have a contraindication to, or intolerance of Velcade (bortezomib).     An urgent appeal for the prior authorization denial of Pomalyst has been started by the pharmacist.   This encounter will continue to be updated until final appeal determination.     Lady Deutscher, CPhT-Adv Oncology Pharmacy Patient [place] Direct Number: [phone]  Fax: [phone]

## 2022-06-17 NOTE — Progress Notes (Incomplete)
Histology and Location of Primary Cancer:  Refractory multiple myeloma   Sites of Visceral and Bony Metastatic Disease:  CT Thoracic Spine w/ Contrast 06/03/2022 --IMPRESSION: Suboptimal exam with only partial coverage of the T2 level where there is extensive tumor replacement of the vertebral body with collapse and paravertebral/spinal canal mass. Additional lesions also seen on recent MRI.  MRI Spine w/ & w/o Contrast 05/29/2022 Lumbar --IMPRESSION: Generally small Multiple Myeloma lesions scattered at all lumbar levels and in the visible left sacral ala. Largest lumbar tumor is 2.3 cm in the left L1 posterior elements and pedicle. No lumbar extraosseous or epidural tumor extension, or pathologic fracture. 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 No change compared to 05/27/2022. No cord edema or myelomalacia identified. Small tumor foci at most other thoracic levels, with no other thoracic extraosseous or epidural tumor extension. Posterior rib involvement intermittently noted. Small layering pleural effusions. Lumbar MRI today is reported separately. Cervical --IMPRESSION: Interval progression of multiple myeloma in the thoracic spine compared with previous chest CT of 10 weeks ago. There is a new pathologic fracture at T2 with significant osseous retropulsion and resulting cord compression. No abnormal cord signal identified. Additional smaller lesions within the T1 vertebral body and right C7 and T3 articulating facets. No other pathologic fractures identified. Mild multilevel cervical spondylosis with resulting mild spinal stenosis and mild to moderate foraminal narrowing at  multiple levels as described, similar to previous MRI. No cord compression in the cervical spine.  Past or anticipated interventions, if any, per neurosurgery:  06/04/2022 --Dr. Ashok Pall Thoracic one - Thoracic Four Posterior lateral arthrodesis/ fusion and Thoracic two Corpectomy for tumor resection, extradural. Interbody Sabre cage placed, allograft morsels in the corpectomy site Application of O-Arm Segmental pedicle screw fixation T1-T4 Globus  Past/Anticipated chemotherapy by medical oncology, if any:  Under care of Dr. Narda Rutherford 06/07/2022 --Refractory Multiple Myeloma Based on this spinal lesion and progression of serum proteins will need to change chemotherapy regimens.   Currently on Dara/rev/Dex chemotherapy will need to transition to another line of therapy,  anticipate a Velcade based regimen.   Will discuss further in the outpatient setting once patient has recovered from his surgery  Pain on a scale of 0-10 is: ***   If Spine Met(s), symptoms, if any, include: Bowel/Bladder retention or incontinence (please describe): *** Numbness or weakness in extremities (please describe): *** Current Decadron regimen, if applicable:   Ambulatory status? Walker? Wheelchair?: ***  SAFETY ISSUES: Prior radiation? Yes 05/29/2022:  Thoracic Spine   01/06/2022 through 01/19/2022 Site Technique Total Dose (Gy) Dose per Fx (Gy) Completed Fx Beam Energies  Humerus, Right: Ext_R_Humerus Complex 20/20 2 10/10 6X, 10X  Arm, Right: Ext_R_Forearm Complex 20/20 2 10/10 6X  Chest, Right: Chest_R_Rib Complex 20/20 2 10/10 10X   Pacemaker/ICD? *** Possible current pregnancy? N/A Is the patient on methotrexate? ***  Current Complaints / other details:  ***

## 2022-06-18 ENCOUNTER — Other Ambulatory Visit: Payer: Self-pay

## 2022-06-18 ENCOUNTER — Other Ambulatory Visit (HOSPITAL_COMMUNITY): Payer: Self-pay

## 2022-06-18 MED ORDER — POMALIDOMIDE 4 MG PO CAPS: 4.0000 mg | ORAL_CAPSULE | Freq: Every day | ORAL | 0 refills | Status: DC

## 2022-06-18 NOTE — Telephone Encounter (Signed)
Oral Oncology Pharmacist Encounter  Received new prescription for Pomalyst (pomalidomide) for the treatment of R/R multiple myeloma in conjunction with bortezomib and dexamethasone, planned duration until disease progression or unacceptable drug toxicity.  BMP and CBC from 06/09/22 assessed, noted patient with Hgb 8.4 g/dL and pltc of 100 K/uL (appears to be up from 89 K/uL previously). No baseline renal dose adjustments required. Prescription dose and frequency assessed for appropriateness. Appropriate for therapy initiation.   Current medication list in Epic reviewed, DDIs with Pomalyst identified: Pomalyst in combination with Tramadol as well as Gabapentin can increase risk for CNS depression. No change in therapy warranted at this time, recommend monitoring for increased somnolence while on therapy.   Evaluated chart and no patient barriers to medication adherence noted.   Prescription has been e-scribed to [place] for dispensing.   Oral Oncology Clinic will continue to follow for insurance authorization, copayment issues, initial counseling and start date.  Leron Croak, PharmD, BCPS, [facility] Clinical Pharmacist [facility] [place] [phone] 06/18/2022 12:29 PM

## 2022-06-18 NOTE — Telephone Encounter (Signed)
Oral Oncology Patient Advocate Encounter  Prior Authorization for Pomalyst has been approved.    PA# [id number] Effective dates: 06/18/22 through 05/03/23  Patients co-pay is $994.96.  Travis [name], CPhT-Adv Oncology Pharmacy Patient [place] Direct Number: [phone]  Fax: [phone]

## 2022-06-20 NOTE — Progress Notes (Incomplete)
Radiation Oncology         [phone] ________________________________  Follow-up New Visit   Outpatient   Name: Travis [name] MRN: [id number]  Date: 06/21/2022  DOB: 01-Feb-1951  [id number], Walker Shadow, MD  Blanchie Dessert, MD   REFERRING PHYSICIAN: Blanchie Dessert, MD  DIAGNOSIS: The primary encounter diagnosis was Multiple myeloma, remission status unspecified ([place]). A diagnosis of Multiple myeloma not having achieved remission [facility]) was also pertinent to this visit.  Multiple myeloma with lytic lesions in the right humerus, right ulna, right 6th rib and thoracic spine    Interval Since Last Radiation: 23 days   Radiation Treatment Date: 05/29/22 Diagnosis: Multiple Myeloma with spinal cord compression at T2 Site/Dose: T2 treated with 3.0 Gy delivered in 1 Fx  1) Intent: Palliative Radiation Treatment Dates: 01/06/2022 through 01/19/2022 Site Technique Total Dose (Gy) Dose per Fx (Gy) Completed Fx Beam Energies  Humerus, Right: Ext_R_Humerus Complex 20/20 2 10/10 6X, 10X  Arm, Right: Ext_R_Forearm Complex 20/20 2 10/10 6X  Chest, Right: Chest_R_Rib Complex 20/20 2 10/10 10X    Narrative / Interval History ::Travis [name] is a 72 y.o. male who is accompanied by ***. he is returns today to discuss the role of post-operative radiation therapy as part of management for his recently diagnosed multiple myeloma. To review, the patient underwent urgent radiation treatment to T2 on 05/29/22. He remained inpatient following radiation to undergo surgery under the direction of Dr. Ashok Pall. At the time of his inpatient consultation, the patient was informed that he would likely require postop radiation treatments at a later date.  In the interval since the patient was seen in consultation, he accordingly underwent T1-T4 posterior lateral arthrodesis/fusion and T2 corpectomy for tumor resection on 06/04/22 under the care of Dr. Christella Noa. Biopsy of the T2 vertebral body  collected at the time of his procedure revealed findings compatible with plasmacytoma/plasma cell myeloma.   - Pre-op CT of the thoracic spine with contrast on 06/03/22 showed extensive tumor replacement of the T2 vertebral body with collapse and paravertebral/spinal canal mass.   The patient was also seen by Dr. Alvy Bimler while inpatient who recommended around-the-clock high-dose dexamethasone while inpatient. In regards to his prior treatment regimen, Revlimid was held while the patient was hospitalized.   The patient was also seen by Dr. Lorenso Courier while inpatient on 06/07/22. In light of disease progression while on his prior chemotherapy regimen consisting of Daratumumab and Revlimid, Dr. Lorenso Courier has advised transitioning to a Velcade based regimen. The patient will return to Dr. Lorenso Courier for OP follow-up in the near future to discuss systemic treatment options further. Treatment will not be initiated until the patient has recovered from surgery.   HPI Initial Inpatient Consultation 05/29/22: Travis [name] is a 72 year old gentleman with a prior history of palliative radiation therapy as documented above.  He was treated by Dr. Tyler Pita back in the early fall of last year.  Late last year the patient began experiencing some mild pain in his upper back.  This is progressed significantly over the past several weeks.  He was seen by Dr. Hulan Saas who felt the patient may have progression of his myeloma.  The patient subsequently underwent an MRI of the cervical spine which showed probable cord compression in the upper thoracic spine at T2. The patient was subsequently admitted and placed on high-dose steroids. MRI this morning confirmed a cord compression at T2. Radiation oncology has been consulted for consideration for urgent treatment.  PREVIOUS RADIATION THERAPY: {EXAM; YES/NO:19492::"No"}  PAST MEDICAL HISTORY:  Past Medical History:  Diagnosis Date   Arthritis    "comes and  goes; mostly in hands, occasionally elbow" (0000000)   Complication of anesthesia    "just wanted to sleep alot  for couple days S/P VARICOCELE OR" (04/05/2017)   DVT (deep venous thrombosis) (HCC)    LLE   Dysrhythmia    PVCs    GERD (gastroesophageal reflux disease)    History of hiatal hernia    Hypertension    Impaired glucose tolerance    Multiple myeloma (HCC)    Obesity (BMI 30-39.9) 07/26/2016   OSA on CPAP 07/26/2016   Mild with AHI 12.5/hr now on CPAP at 14cm H2O   PAF (paroxysmal atrial fibrillation) ([place])    s/p PVI Ablation in 2018 // Flecainide Rx // Echo 8/21: EF 60-65, no RWMA, mild LVH, normal RV SF, moderate RVE, mild BAE, trivial MR, mild dilation of aortic root (40 mm)   Pneumonia    "may have had walking pneumonia a few years ago" (04/05/2017)   Pre-diabetes    Thrombophlebitis     PAST SURGICAL HISTORY: Past Surgical History:  Procedure Laterality Date   ATRIAL FIBRILLATION ABLATION  04/05/2017   ATRIAL FIBRILLATION ABLATION N/A 04/05/2017   Procedure: ATRIAL FIBRILLATION ABLATION;  Surgeon: Thompson Grayer, MD;  Location: [place] [facility];  Service: Cardiovascular;  Laterality: N/A;   BONE BIOPSY Right 12/17/2021   Procedure: BONE BIOPSY;  Surgeon: Hiram Gash, MD;  Location: [place];  Service: Orthopedics;  Laterality: Right;   COMPLETE RIGHT HIP REPLACMENT  01/19/2019   EVALUATION UNDER ANESTHESIA WITH HEMORRHOIDECTOMY N/A 02/24/2017   Procedure: EXAM UNDER ANESTHESIA WITH  HEMORRHOIDECTOMY;  Surgeon: Michael Boston, MD;  Location: [facility];  Service: General;  Laterality: N/A;   EXCISIONAL TOTAL HIP ARTHROPLASTY WITH ANTIBIOTIC SPACERS Right 09/25/2020   Procedure: EXCISIONAL TOTAL HIP ARTHROPLASTY WITH ANTIBIOTIC SPACERS;  Surgeon: Paralee Cancel, MD;  Location: [facility];  Service: Orthopedics;  Laterality: Right;   FLEXIBLE SIGMOIDOSCOPY N/A 04/15/2015   Procedure:  UNSEDATED FLEXIBLE SIGMOIDOSCOPY;  Surgeon: [name], MD;  Location:  [facility];  Service: Endoscopy;  Laterality: N/A;   HUMERUS IM NAIL Right 12/17/2021   Procedure: INTRAMEDULLARY (IM) NAIL HUMERAL;  Surgeon: Hiram Gash, MD;  Location: [place];  Service: Orthopedics;  Laterality: Right;   KNEE ARTHROSCOPY Left ~ 2016   POSTERIOR CERVICAL FUSION/FORAMINOTOMY N/A 06/04/2022   Procedure: Thoracic one - Thoracic Four Posterior lateral arthrodesis/ fusion and Thoracic two Corpectomy;  Surgeon: Ashok Pall, MD;  Location: [place];  Service: Neurosurgery;  Laterality: N/A;   REIMPLANTATION OF TOTAL HIP Right 12/25/2020   Procedure: REIMPLANTATION/REVISION OF RIGHT TOTAL HIP VERSUS REPEAT IRRIGATION AND DEBRIDEMENT;  Surgeon: Paralee Cancel, MD;  Location: [facility];  Service: Orthopedics;  Laterality: Right;   TESTICLE SURGERY  1988   VARICOCELE   TONSILLECTOMY     VARICOSE VEIN SURGERY Bilateral     FAMILY HISTORY:  Family History  Problem Relation Age of Onset   Dementia Mother    Stroke Father    Atrial fibrillation Father    Hypertension Father    Hypertension Paternal Grandfather    Dementia Maternal Grandmother     SOCIAL HISTORY:  Social History   Tobacco Use   Smoking status: Never   Smokeless tobacco: Never  Vaping Use   Vaping Use: Never used  Substance Use Topics   Alcohol use: Yes  Alcohol/week: 2.0 standard drinks of alcohol    Types: 2 Glasses of wine per week    Comment: 2 glasses of wine a week, occ bourbon, beer   Drug use: No    ALLERGIES:  Allergies  Allergen Reactions   Linezolid Other (See Comments)    Burning tongue    Vancomycin Rash    MEDICATIONS:  Current Outpatient Medications  Medication Sig Dispense Refill   pomalidomide (POMALYST) 4 MG capsule Take 1 capsule (4 mg total) by mouth daily. Take 1 capsule for 21 days then off for 7 days 21 capsule 0   acetaminophen (TYLENOL) 325 MG tablet Take 2 tablets (650 mg total) by mouth every 6 (six) hours as needed. 36 tablet 0   acyclovir (ZOVIRAX)  400 MG tablet Take 1 tablet (400 mg total) by mouth 2 (two) times daily. 180 tablet 3   amiodarone (PACERONE) 200 MG tablet Take 1 tablet (200 mg total) by mouth daily. 180 tablet 1   amLODipine (NORVASC) 10 MG tablet Take 10 mg by mouth daily.      cholecalciferol (VITAMIN D3) 25 MCG (1000 UNIT) tablet Take 1,000 Units by mouth daily.     clobetasol cream (TEMOVATE) [id number] % Apply 1 Application topically daily as needed (rash).     Cyanocobalamin (VITAMIN B-12 PO) Take 1 tablet by mouth daily. Not sure the dosage     fluticasone (FLONASE) 50 MCG/ACT nasal spray Place 1 spray into both nostrils daily as needed for allergies or rhinitis.     folic acid (FOLVITE) 1 MG tablet Take 1 mg by mouth daily.     gabapentin (NEURONTIN) 300 MG capsule TAKE TWO CAPSULES BY MOUTH TWICE A DAY (Patient taking differently: Take 600 mg by mouth 2 (two) times daily.) 360 capsule 1   hydrALAZINE (APRESOLINE) 25 MG tablet Take 1.5 tablets (37.5 mg total) by mouth 3 (three) times daily. (Patient taking differently: Take 50 mg by mouth 3 (three) times daily.) 405 tablet 1   lidocaine (LIDODERM) 5 % Place 1 patch onto the skin daily as needed. Remove & Discard patch within 12 hours or as directed by MD 15 patch 0   lisinopril (ZESTRIL) 40 MG tablet TAKE ONE TABLET BY MOUTH ONE TIME DAILY (Patient taking differently: Take 40 mg by mouth in the morning and at bedtime.) 90 tablet 3   Menthol-Camphor (ICY HOT ADVANCED PAIN RELIEF) 16-11 % CREA Apply 1 Application topically at bedtime as needed (BACK PAIN).     Omega-3 Fatty Acids (FISH OIL PO) Take 1 Capful by mouth daily.     sodium chloride (OCEAN) 0.65 % SOLN nasal spray Place 1 spray into both nostrils as needed for congestion.     traMADol (ULTRAM) 50 MG tablet Take 1 tablet (50 mg total) by mouth every 6 (six) hours as needed. 30 tablet 0   triamcinolone cream (KENALOG) 0.1 % Apply 1 application topically daily as needed (itching).     No current facility-administered  medications for this encounter.    REVIEW OF SYSTEMS:  A 10+ POINT REVIEW OF SYSTEMS WAS OBTAINED including neurology, dermatology, psychiatry, cardiac, respiratory, lymph, extremities, GI, GU, musculoskeletal, constitutional, reproductive, HEENT. ***   PHYSICAL EXAM:  vitals were not taken for this visit.   General: Alert and oriented, in no acute distress HEENT: Head is normocephalic. Extraocular movements are intact. Oropharynx is clear. Neck: Neck is supple, no palpable cervical or supraclavicular lymphadenopathy. Heart: Regular in rate and rhythm with no murmurs, rubs, or gallops. Chest:  Clear to auscultation bilaterally, with no rhonchi, wheezes, or rales. Abdomen: Soft, nontender, nondistended, with no rigidity or guarding. Extremities: No cyanosis or edema. Lymphatics: see Neck Exam Skin: No concerning lesions. Musculoskeletal: symmetric strength and muscle tone throughout. Neurologic: Cranial nerves II through XII are grossly intact. No obvious focalities. Speech is fluent. Coordination is intact. Psychiatric: Judgment and insight are intact. Affect is appropriate. ***  ECOG = ***  0 - Asymptomatic (Fully active, able to carry on all predisease activities without restriction)  1 - Symptomatic but completely ambulatory (Restricted in physically strenuous activity but ambulatory and able to carry out work of a light or sedentary nature. For example, light housework, office work)  2 - Symptomatic, <50% in bed during the day (Ambulatory and capable of all self care but unable to carry out any work activities. Up and about more than 50% of waking hours)  3 - Symptomatic, >50% in bed, but not bedbound (Capable of only limited self-care, confined to bed or chair 50% or more of waking hours)  4 - Bedbound (Completely disabled. Cannot carry on any self-care. Totally confined to bed or chair)  5 - Death   Eustace Pen MM, Creech RH, Tormey DC, et al. [phone]). "Toxicity and response criteria  of the [facility]". [place] Oncol. 5 (6): 649-55  LABORATORY DATA:  Lab Results  Component Value Date   WBC 6.6 06/09/2022   HGB 8.4 (L) 06/09/2022   HCT 24.8 (L) 06/09/2022   MCV 95.4 06/09/2022   PLT 100 (L) 06/09/2022   NEUTROABS 6.1 05/29/2022   Lab Results  Component Value Date   NA 135 06/09/2022   K 5.0 06/09/2022   CL 104 06/09/2022   CO2 23 06/09/2022   GLUCOSE 179 (H) 06/09/2022   BUN 20 06/09/2022   CREATININE 0.76 06/09/2022   CALCIUM 7.6 (L) 06/09/2022      RADIOGRAPHY: DG Cervical Spine 2 or 3 views  Result Date: 06/04/2022 CLINICAL DATA:  Elective surgery EXAM: CERVICAL SPINE - 2-3 VIEW COMPARISON:  MRI cervical spine 05/27/2022. Cervical spine x-ray 05/13/2022 FINDINGS: Two views of the cervical spine were submitted, intraoperative. No hardware identified. Fluoroscopy time 15 seconds. Dose: 1.76 micro Gy. IMPRESSION: Intraoperative cervical spine as above. Electronically Signed   By: Ronney Asters M.D.   On: 06/04/2022 15:25   DG C-Arm 1-60 Min-No Report  Result Date: 06/04/2022 Fluoroscopy was utilized by the requesting physician.  No radiographic interpretation.   DG O-ARM IMAGE ONLY/NO REPORT  Result Date: 06/04/2022 There is no Radiologist interpretation  for this exam.  CT THORACIC SPINE W CONTRAST  Result Date: 06/04/2022 CLINICAL DATA:  Metastatic disease.  Preoperative assessment EXAM: CT THORACIC SPINE WITH CONTRAST TECHNIQUE: Multidetector CT images of thoracic was performed according to the standard protocol following intravenous contrast administration. RADIATION DOSE REDUCTION: This exam was performed according to the departmental dose-optimization program which includes automated exposure control, adjustment of the mA and/or kV according to patient size and/or use of iterative reconstruction technique. CONTRAST:  64m OMNIPAQUE IOHEXOL 350 MG/ML SOLN COMPARISON:  05/29/2022 FINDINGS: Alignment: Exaggerated thoracic  kyphosis. Vertebrae: Suboptimal coverage especially considering level of primary concern. Partially covered T2 vertebra with known tumoral erosion of the vertebral body with minimal remaining inferior endplate visible. Compression fracture of this vertebra with advanced narrowing. Diffuse heterogeneity of marrow with tumor extent better assessed on prior MRI, most notable deposits in the T8 body, T11 spinous process, and L1 left pedicle. Additional rib lesions on the  right at T6 and T8 primarily. Paraspinal and other soft tissues: Paravertebral tumor eccentric to the left at T2. Disc levels: Ordinary thoracic spondylosis. Request was made for additional imaging but declined due to pending surgery IMPRESSION: 1. Suboptimal exam with only partial coverage of the T2 level where there is extensive tumor replacement of the vertebral body with collapse and paravertebral/spinal canal mass. 2. Additional lesions also seen on recent MRI. Electronically Signed   By: Jorje Guild M.D.   On: 06/04/2022 07:44   MR LUMBAR SPINE W WO CONTRAST  Result Date: 05/29/2022 CLINICAL DATA:  72 year old male with multiple myeloma. 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 EXAM: MRI LUMBAR SPINE WITHOUT AND WITH CONTRAST TECHNIQUE: Multiplanar and multiecho pulse sequences of the lumbar spine were obtained without and with intravenous contrast. CONTRAST:  71m GADAVIST GADOBUTROL 1 MMOL/ML IV SOLN thoracic in conjunction with contrast enhanced imaging of the chest, abdomen, and pelvis reported separately. COMPARISON:  Thoracic MRI today reported separately. Prior lumbar MRI 06/25/2019. FINDINGS: Segmentation: Normal, concordant with the thoracic numbering today and the same designation used in 2021. Alignment:  Stable lumbar vertebral height and alignment since 2021. Vertebrae: Confluent up to 2.3 cm tumor in the left L1 posterior elements including the  pedicle (series 11, image 7). Small 8 mm tumor in the inferior L2 body. And larger 2 cm tumor in the left L2 pedicle. Subcentimeter tumor inferior L3 vertebral body. Larger 10 mm tumor posterior L4 vertebral body. L5 small 9 mm and smaller tumor in the right L5 body. Scattered subcentimeter left sacral ala tumor. These lesions are new since 2021. No lumbar or visible sacral pathologic fracture. No extraosseous or epidural tumor identified. Conus medullaris and cauda equina: Conus extends to the L1 level. No lower spinal cord or conus signal abnormality. No abnormal intradural enhancement. No dural thickening identified. Paraspinal and other soft tissues: Negative for age visible abdominal viscera. Negative lumbar paraspinal soft tissues. Disc levels: Lumbar spine degeneration not significantly changed since 2021 Aside from new moderate size central disc protrusion at L3-L4 (series 11, image 22) now resulting in mild to moderate spinal stenosis at that level. IMPRESSION: 1. Generally small Multiple Myeloma lesions scattered at all lumbar levels and in the visible left sacral ala. Largest lumbar tumor is 2.3 cm in the left L1 posterior elements and pedicle. No lumbar extraosseous or epidural tumor extension, or pathologic fracture. 2. 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 Electronically Signed   By: HGenevie AnnM.D.   On: 05/29/2022 12:22   MR THORACIC SPINE W WO CONTRAST  Result Date: 05/29/2022 CLINICAL DATA:  72year old male with multiple myeloma. 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 EXAM: MRI THORACIC WITHOUT AND WITH CONTRAST TECHNIQUE: Multiplanar and multiecho pulse sequences of the thoracic spine were obtained without and with intravenous contrast. CONTRAST:  181mGADAVIST GADOBUTROL 1 MMOL/ML IV  SOLN COMPARISON:  Cervical spine MRI without and with contrast 05/27/2022. FINDINGS: Limited cervical spine imaging: Stable compared to the recent 05/27/2022 exam. Thoracic spine segmentation:  Appears to be normal. Alignment: Exaggerated upper thoracic kyphosis and part related to the T2 pathologic lesion. Vertebrae: T1 better demonstrated on the recent cervical comparison. Highly abnormal T2 level with retropulsed and epidural tumor. Subsequent cord compression which does not appear  significantly changed since 05/27/2022 (series 22, image 9, series 23, images 7 and 8). No definite cord edema or myelomalacia. Bulky ventral and left lateral extraosseous tumor extension as previously noted. Tumor in the T2 lamina and spinous process also. See series 26, image 9). Mild right T3 posteroinferior vertebral body tumor tracking toward the pedicle. No pathologic fracture. No tumor identified at T4, T5. However, there is posterior right 5th rib tumor on series 26, image 26. Similar small 5 mm enhancing tumor in the left T6 inferior body, endplate. No tumor identified at T7. However, there is posterior right 7th rib tumor (series 27, image 5). T8 12 mm tumor in the anterior body on the right side. No pathologic fracture. No T9 tumor. T10 inferior endplate 12 mm tumor with subtle associated inferior endplate compression. T11 bulky 18 mm midline lamina and spinous process tumor, but no extraosseous extension. No T12 tumor. Lumbar spine detailed separately. Cord: Normal spinal canal patency and thoracic cord below the T2-T3 disc level. Cord signal and morphology remains normal to the conus at L1. No abnormal intradural enhancement. No definite dural thickening. Paraspinal and other soft tissues: T2 paraspinal tumor eccentric to the left as above. Small layering pleural effusions. Negative visible other chest and upper abdominal viscera. Disc levels: Mild for age underlying thoracic spine degeneration. No degenerative spinal  stenosis. IMPRESSION: 1. 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 No change compared to 05/27/2022. No cord edema or myelomalacia identified. 2. Small tumor foci at most other thoracic levels, with no other thoracic extraosseous or epidural tumor extension. Posterior rib involvement intermittently noted. 3. Small layering pleural effusions. 4. Lumbar MRI today is reported separately. Electronically Signed   By: Genevie Ann M.D.   On: 05/29/2022 12:11   MR CERVICAL SPINE W WO CONTRAST  Addendum Date: 05/28/2022   ADDENDUM REPORT: 05/28/2022 16:46 ADDENDUM: Critical Value/emergent results were called by telephone at the time of interpretation on 05/28/2022 at 4:40 pm to provider on-call Heath Lark, who verbally acknowledged these results. Electronically Signed   By: Richardean Sale M.D.   On: 05/28/2022 16:46   Result Date: 05/28/2022 CLINICAL DATA:  Multiple myeloma. Increasing neck pain/tenderness with limited range of motion. EXAM: MRI CERVICAL SPINE WITHOUT AND WITH CONTRAST TECHNIQUE: Multiplanar and multiecho pulse sequences of the cervical spine, to include the craniocervical junction and cervicothoracic junction, were obtained without and with intravenous contrast. CONTRAST:  89m GADAVIST GADOBUTROL 1 MMOL/ML IV SOLN COMPARISON:  Radiographs of the cervical and thoracic spine 05/13/2022. Chest CTA 03/15/2022. Cervical MRI 03/06/2020. FINDINGS: Alignment: The alignment of the cervical spine is anatomic, although progressive kyphosis has developed at T2 secondary to a pathologic fracture. Vertebrae: As demonstrated on previous chest CTA, there are lytic lesions in the thoracic spine with associated enhancement following contrast. A large lesion at T2 has progressed and is associated with a new marked pathologic fracture with osseous retropulsion by 6 mm. Resulting cord compression. There is  near complete replacement of the T2 vertebral body by tumor, measuring up to 4.8 x 4.0 cm transverse on axial image 47/12. There are additional smaller myelomatous lesions within the T1 vertebral body, within the right C7 and right T3 articulating facets. No other pathologic fractures are identified. Cord: The cervical cord is normal in signal and caliber. There is posterior displacement and mild compression of the thoracic cord at T2 by the pathologic fracture described above. Posterior Fossa, vertebral arteries,  paraspinal tissues: Linear enhancement within the right cerebellar hemisphere is likely due to an incidental venous angioma. No associated significant linear T2 hyperintensity. The visualized posterior fossa is otherwise unremarkable.Bilateral vertebral artery flow voids. No significant paraspinal findings. Disc levels: C2-3: Bilateral facet hypertrophy contributes to chronic left foraminal narrowing. No cord deformity. C3-4: The disc appears normal. The left facet joint appears ankylosed. Chronic moderate left foraminal narrowing. C4-5: Chronic disc bulging with asymmetric uncinate spurring on the right and bilateral facet hypertrophy. Stable mild spinal stenosis without cord deformity. Moderate right-greater-than-left foraminal narrowing is similar to the previous study. C5-6: Chronic spondylosis with posterior osteophytes covering diffusely bulging disc material. Mild facet hypertrophy. Stable mild spinal stenosis and mild foraminal narrowing bilaterally. C6-7: Chronic spondylosis with posterior osteophytes covering diffusely bulging disc material. Mild bilateral facet hypertrophy. Mild foraminal narrowing bilaterally. C7-T1: Normal interspace. As above, pathologic fracture at T2 with significant osseous retropulsion and resulting cord compression. There is moderate left greater than right foraminal narrowing at the T1-2 and T2-3 disc space levels. IMPRESSION: 1. Interval progression of multiple myeloma  in the thoracic spine compared with previous chest CT of 10 weeks ago. There is a new pathologic fracture at T2 with significant osseous retropulsion and resulting cord compression. No abnormal cord signal identified. 2. Additional smaller lesions within the T1 vertebral body and right C7 and T3 articulating facets. No other pathologic fractures identified. 3. Mild multilevel cervical spondylosis with resulting mild spinal stenosis and mild to moderate foraminal narrowing at multiple levels as described, similar to previous MRI. No cord compression in the cervical spine. Electronically Signed: By: Richardean Sale M.D. On: 05/28/2022 16:11      IMPRESSION: Multiple myeloma with lytic lesions in the right humerus, right ulna, right 6th rib and thoracic spine    ***  Today, I talked to the patient and family about the findings and work-up thus far.  We discussed the natural history of *** and general treatment, highlighting the role of radiotherapy in the management.  We discussed the available radiation techniques, and focused on the details of logistics and delivery.  We reviewed the anticipated acute and late sequelae associated with radiation in this setting.  The patient was encouraged to ask questions that I answered to the best of my ability. *** A patient consent form was discussed and signed.  We retained a copy for our records.  The patient would like to proceed with radiation and will be scheduled for CT simulation.  PLAN: ***    *** minutes of total time was spent for this patient encounter, including preparation, face-to-face counseling with the patient and coordination of care, physical exam, and documentation of the encounter.   ------------------------------------------------  Blair Promise, PhD, MD  This document serves as a record of services personally performed by Gery Pray, MD. It was created on his behalf by Roney Mans, a trained medical scribe. The creation of this record  is based on the scribe's personal observations and the provider's statements to them. This document has been checked and approved by the attending provider.

## 2022-06-21 ENCOUNTER — Ambulatory Visit: Payer: Medicare Other | Admitting: Radiation Oncology

## 2022-06-21 ENCOUNTER — Encounter (HOSPITAL_COMMUNITY): Payer: Self-pay | Admitting: Emergency Medicine

## 2022-06-21 ENCOUNTER — Ambulatory Visit: Payer: Medicare Other

## 2022-06-21 ENCOUNTER — Other Ambulatory Visit: Payer: Self-pay

## 2022-06-21 ENCOUNTER — Ambulatory Visit
Admit: 2022-06-21 | Discharge: 2022-06-21 | Disposition: A | Discharge status: Home / Self Care | Payer: Medicare Other | Attending: Radiation Oncology | Admitting: Radiation Oncology

## 2022-06-21 ENCOUNTER — Observation Stay (HOSPITAL_COMMUNITY)
Admission: EM | Admit: 2022-06-21 | Discharge: 2022-06-22 | Disposition: A | Discharge status: Home / Self Care | Payer: Medicare Other | Attending: Family Medicine | Admitting: Family Medicine

## 2022-06-21 ENCOUNTER — Telehealth: Payer: Self-pay | Admitting: Radiation Oncology

## 2022-06-21 ENCOUNTER — Emergency Department (HOSPITAL_BASED_OUTPATIENT_CLINIC_OR_DEPARTMENT_OTHER): Payer: Medicare Other

## 2022-06-21 ENCOUNTER — Emergency Department (HOSPITAL_COMMUNITY): Payer: Medicare Other

## 2022-06-21 DIAGNOSIS — Z79899 Other long term (current) drug therapy: Secondary | ICD-10-CM | POA: Diagnosis not present

## 2022-06-21 DIAGNOSIS — M549 Dorsalgia, unspecified: Principal | ICD-10-CM | POA: Insufficient documentation

## 2022-06-21 DIAGNOSIS — I10 Essential (primary) hypertension: Secondary | ICD-10-CM | POA: Diagnosis present

## 2022-06-21 DIAGNOSIS — E669 Obesity, unspecified: Secondary | ICD-10-CM | POA: Diagnosis present

## 2022-06-21 DIAGNOSIS — I82452 Acute embolism and thrombosis of left peroneal vein: Secondary | ICD-10-CM | POA: Insufficient documentation

## 2022-06-21 DIAGNOSIS — Z96641 Presence of right artificial hip joint: Secondary | ICD-10-CM | POA: Insufficient documentation

## 2022-06-21 DIAGNOSIS — E876 Hypokalemia: Secondary | ICD-10-CM | POA: Diagnosis not present

## 2022-06-21 DIAGNOSIS — I82442 Acute embolism and thrombosis of left tibial vein: Principal | ICD-10-CM | POA: Diagnosis present

## 2022-06-21 DIAGNOSIS — M7989 Other specified soft tissue disorders: Secondary | ICD-10-CM

## 2022-06-21 DIAGNOSIS — I480 Paroxysmal atrial fibrillation: Secondary | ICD-10-CM | POA: Diagnosis not present

## 2022-06-21 DIAGNOSIS — Z7901 Long term (current) use of anticoagulants: Secondary | ICD-10-CM | POA: Insufficient documentation

## 2022-06-21 DIAGNOSIS — D472 Monoclonal gammopathy: Secondary | ICD-10-CM | POA: Diagnosis present

## 2022-06-21 DIAGNOSIS — C9000 Multiple myeloma not having achieved remission: Secondary | ICD-10-CM | POA: Diagnosis not present

## 2022-06-21 DIAGNOSIS — I4891 Unspecified atrial fibrillation: Secondary | ICD-10-CM | POA: Diagnosis present

## 2022-06-21 DIAGNOSIS — K219 Gastro-esophageal reflux disease without esophagitis: Secondary | ICD-10-CM | POA: Diagnosis present

## 2022-06-21 LAB — BASIC METABOLIC PANEL
Anion gap: 5 (ref 5–15)
BUN: 16 mg/dL (ref 8–23)
CO2: 26 mmol/L (ref 22–32)
Calcium: 7.6 mg/dL — ABNORMAL LOW (ref 8.9–10.3)
Chloride: 104 mmol/L (ref 98–111)
Creatinine, Ser: 0.75 mg/dL (ref 0.61–1.24)
GFR, Estimated: >60 mL/min (ref >60)
Glucose, Bld: 103 mg/dL — ABNORMAL HIGH (ref 70–99)
Potassium: 3 mmol/L — ABNORMAL LOW (ref 3.5–5.1)
Sodium: 135 mmol/L (ref 135–145)

## 2022-06-21 LAB — CBC
HCT: 31.6 % — ABNORMAL LOW (ref 39.0–52.0)
Hemoglobin: 10.3 g/dL — ABNORMAL LOW (ref 13.0–17.0)
MCH: 32.8 pg (ref 26.0–34.0)
MCHC: 32.6 g/dL (ref 30.0–36.0)
MCV: 100.6 fL — ABNORMAL HIGH (ref 80.0–100.0)
Platelets: 165 10*3/uL (ref 150–400)
RBC: 3.14 MIL/uL — ABNORMAL LOW (ref 4.22–5.81)
RDW: 17 % — ABNORMAL HIGH (ref 11.5–15.5)
WBC: 7 10*3/uL (ref 4.0–10.5)
nRBC: 0 % (ref 0.0–0.2)

## 2022-06-21 LAB — MAGNESIUM: Magnesium: 1.9 mg/dL (ref 1.7–2.4)

## 2022-06-21 MED ORDER — APIXABAN 5 MG PO TABS
5.0000 mg | ORAL_TABLET | Freq: Two times a day (BID) | ORAL | Status: DC
  Administered 2022-06-21 (×2): 5 mg via ORAL
  Filled 2022-06-21 (×2): qty 1

## 2022-06-21 MED ORDER — METOPROLOL TARTRATE 5 MG/5ML IV SOLN: 5.0000 mg | Freq: Four times a day (QID) | INTRAVENOUS | Status: DC | PRN

## 2022-06-21 MED ORDER — HYDRALAZINE HCL 25 MG PO TABS
50.0000 mg | ORAL_TABLET | Freq: Three times a day (TID) | ORAL | Status: DC
  Administered 2022-06-21 – 2022-06-22 (×3): 50 mg via ORAL
  Filled 2022-06-21 (×3): qty 2

## 2022-06-21 MED ORDER — DILTIAZEM HCL-DEXTROSE 125-5 MG/125ML-% IV SOLN (PREMIX)
5.0000 mg/h | INTRAVENOUS | Status: DC
  Administered 2022-06-21: 5 mg/h via INTRAVENOUS
  Filled 2022-06-21 (×2): qty 125

## 2022-06-21 MED ORDER — POTASSIUM CHLORIDE CRYS ER 20 MEQ PO TBCR
40.0000 meq | EXTENDED_RELEASE_TABLET | Freq: Once | ORAL | Status: AC
  Administered 2022-06-21: 40 meq via ORAL
  Filled 2022-06-21: qty 2

## 2022-06-21 MED ORDER — ALBUTEROL SULFATE (2.5 MG/3ML) 0.083% IN NEBU: 2.5000 mg | INHALATION_SOLUTION | RESPIRATORY_TRACT | Status: DC | PRN

## 2022-06-21 MED ORDER — GABAPENTIN 300 MG PO CAPS
600.0000 mg | ORAL_CAPSULE | Freq: Two times a day (BID) | ORAL | Status: DC
  Administered 2022-06-21 – 2022-06-22 (×3): 600 mg via ORAL
  Filled 2022-06-21 (×3): qty 2

## 2022-06-21 MED ORDER — DILTIAZEM LOAD VIA INFUSION
15.0000 mg | Freq: Once | INTRAVENOUS | Status: AC
  Administered 2022-06-21: 15 mg via INTRAVENOUS
  Filled 2022-06-21: qty 15

## 2022-06-21 MED ORDER — MORPHINE SULFATE (PF) 4 MG/ML IV SOLN
4.0000 mg | Freq: Once | INTRAVENOUS | Status: AC
  Administered 2022-06-21: 4 mg via INTRAVENOUS
  Filled 2022-06-21: qty 1

## 2022-06-21 MED ORDER — SODIUM CHLORIDE 0.9 % IV BOLUS (SEPSIS)
500.0000 mL | Freq: Once | INTRAVENOUS | Status: AC
  Administered 2022-06-21: 500 mL via INTRAVENOUS

## 2022-06-21 MED ORDER — TRAMADOL HCL 50 MG PO TABS
50.0000 mg | ORAL_TABLET | Freq: Four times a day (QID) | ORAL | Status: DC | PRN
  Administered 2022-06-22 (×2): 50 mg via ORAL
  Filled 2022-06-21 (×2): qty 1

## 2022-06-21 MED ORDER — POTASSIUM CHLORIDE 10 MEQ/100ML IV SOLN
10.0000 meq | Freq: Once | INTRAVENOUS | Status: AC
  Administered 2022-06-21: 10 meq via INTRAVENOUS
  Filled 2022-06-21: qty 100

## 2022-06-21 MED ORDER — ONDANSETRON HCL 4 MG/2ML IJ SOLN: 4.0000 mg | Freq: Four times a day (QID) | INTRAMUSCULAR | Status: DC | PRN

## 2022-06-21 MED ORDER — ACETAMINOPHEN 325 MG PO TABS: 650.0000 mg | ORAL_TABLET | Freq: Four times a day (QID) | ORAL | Status: DC | PRN

## 2022-06-21 MED ORDER — ACETAMINOPHEN 650 MG RE SUPP: 650.0000 mg | Freq: Four times a day (QID) | RECTAL | Status: DC | PRN

## 2022-06-21 MED ORDER — AMIODARONE HCL 200 MG PO TABS
200.0000 mg | ORAL_TABLET | Freq: Every day | ORAL | Status: DC
  Administered 2022-06-21 – 2022-06-22 (×2): 200 mg via ORAL
  Filled 2022-06-21 (×2): qty 1

## 2022-06-21 MED ORDER — SODIUM CHLORIDE 0.9 % IV SOLN
1000.0000 mL | INTRAVENOUS | Status: DC
  Administered 2022-06-21: 1000 mL via INTRAVENOUS

## 2022-06-21 MED ORDER — TRAZODONE HCL 50 MG PO TABS: 25.0000 mg | ORAL_TABLET | Freq: Every evening | ORAL | Status: DC | PRN

## 2022-06-21 MED ORDER — ONDANSETRON HCL 4 MG PO TABS: 4.0000 mg | ORAL_TABLET | Freq: Four times a day (QID) | ORAL | Status: DC | PRN

## 2022-06-21 NOTE — H&P (Signed)
History and Physical  JUJHAR HAMERNIK [id number] DOB: 25-Sep-1950 DOA: 06/21/2022   PCP: [name], MD   Patient coming from: Home   Chief Complaint: Fall   HPI: Travis [name] is a 72 y.o. male with medical history significant for myeloma currently on oral chemotherapy and radiation therapy, who has known atrial fibrillation on Eliquis, his anticoagulation was recently held at the beginning of February due to pathologic thoracic spine fracture, requiring posterior lateral arthrodesis/fusion and corpectomy for tumor resection with Dr. Christella Noa on 2/2.  Did well postoperatively, was discharged home and resumed his Eliquis at home about 1 week ago.  He tells me that starting postoperatively, he noticed swelling in his left lower extremity.  He was given Lasix for this, also has been elevating his leg but there has been no improvement in the left lower extremity swelling.  There is some associated numbness of the left foot, which makes it difficult for him to walk very well.  He has otherwise been doing well at home.  Today he got up from the recliner early in the morning to use the bathroom, when he was on his way back from the bathroom, he lost his balance and fell.  Did not hit his head, did not lose consciousness.  Went to the ER for evaluation.  ED Course: Evaluation in the ER this morning revealed normal blood pressure and respiratory rate, pulse of 142 and rapid atrial fibrillation.  Lab work showed potassium 3.0, hemoglobin 10 improved from previous, and normal renal function with creatinine 0.75.  Patient was given Cardizem bolus and drip, currently he can feel palpitations but is otherwise stable.  Heart rate remains elevated in the 120s.  Denies any pleuritic chest pain, shortness of breath, cough.  Review of Systems: Please see HPI for pertinent positives and negatives. A complete 10 system review of systems are otherwise negative.  Past Medical History:  Diagnosis Date    Arthritis    "comes and goes; mostly in hands, occasionally elbow" (0000000)   Complication of anesthesia    "just wanted to sleep alot  for couple days S/P VARICOCELE OR" (04/05/2017)   DVT (deep venous thrombosis) (HCC)    LLE   Dysrhythmia    PVCs    GERD (gastroesophageal reflux disease)    History of hiatal hernia    Hypertension    Impaired glucose tolerance    Multiple myeloma (HCC)    Obesity (BMI 30-39.9) 07/26/2016   OSA on CPAP 07/26/2016   Mild with AHI 12.5/hr now on CPAP at 14cm H2O   PAF (paroxysmal atrial fibrillation) ([place])    s/p PVI Ablation in 2018 // Flecainide Rx // Echo 8/21: EF 60-65, no RWMA, mild LVH, normal RV SF, moderate RVE, mild BAE, trivial MR, mild dilation of aortic root (40 mm)   Pneumonia    "may have had walking pneumonia a few years ago" (04/05/2017)   Pre-diabetes    Thrombophlebitis    Past Surgical History:  Procedure Laterality Date   ATRIAL FIBRILLATION ABLATION  04/05/2017   ATRIAL FIBRILLATION ABLATION N/A 04/05/2017   Procedure: ATRIAL FIBRILLATION ABLATION;  Surgeon: Thompson Grayer, MD;  Location: [facility];  Service: Cardiovascular;  Laterality: N/A;   BONE BIOPSY Right 12/17/2021   Procedure: BONE BIOPSY;  Surgeon: Hiram Gash, MD;  Location: [facility];  Service: Orthopedics;  Laterality: Right;   COMPLETE RIGHT HIP REPLACMENT  01/19/2019   EVALUATION UNDER ANESTHESIA WITH HEMORRHOIDECTOMY N/A 02/24/2017  Procedure: EXAM UNDER ANESTHESIA WITH  HEMORRHOIDECTOMY;  Surgeon: Michael Boston, MD;  Location: [facility];  Service: General;  Laterality: N/A;   EXCISIONAL TOTAL HIP ARTHROPLASTY WITH ANTIBIOTIC SPACERS Right 09/25/2020   Procedure: EXCISIONAL TOTAL HIP ARTHROPLASTY WITH ANTIBIOTIC SPACERS;  Surgeon: Paralee Cancel, MD;  Location: [facility];  Service: Orthopedics;  Laterality: Right;   FLEXIBLE SIGMOIDOSCOPY N/A 04/15/2015   Procedure:  UNSEDATED FLEXIBLE SIGMOIDOSCOPY;  Surgeon: [name], MD;   Location: [facility];  Service: Endoscopy;  Laterality: N/A;   HUMERUS IM NAIL Right 12/17/2021   Procedure: INTRAMEDULLARY (IM) NAIL HUMERAL;  Surgeon: Hiram Gash, MD;  Location: [facility];  Service: Orthopedics;  Laterality: Right;   KNEE ARTHROSCOPY Left ~ 2016   POSTERIOR CERVICAL FUSION/FORAMINOTOMY N/A 06/04/2022   Procedure: Thoracic one - Thoracic Four Posterior lateral arthrodesis/ fusion and Thoracic two Corpectomy;  Surgeon: Ashok Pall, MD;  Location: [facility];  Service: Neurosurgery;  Laterality: N/A;   REIMPLANTATION OF TOTAL HIP Right 12/25/2020   Procedure: REIMPLANTATION/REVISION OF RIGHT TOTAL HIP VERSUS REPEAT IRRIGATION AND DEBRIDEMENT;  Surgeon: Paralee Cancel, MD;  Location: [facility];  Service: Orthopedics;  Laterality: Right;   TESTICLE SURGERY  1988   VARICOCELE   TONSILLECTOMY     VARICOSE VEIN SURGERY Bilateral     Social History:  reports that he has never smoked. He has never used smokeless tobacco. He reports current alcohol use of about 2.0 standard drinks of alcohol per week. He reports that he does not use drugs.   Allergies  Allergen Reactions   Linezolid Other (See Comments)    Burning tongue    Vancomycin Rash    Family History  Problem Relation Age of Onset   Dementia Mother    Stroke Father    Atrial fibrillation Father    Hypertension Father    Hypertension Paternal Grandfather    Dementia Maternal Grandmother      Prior to Admission medications   Medication Sig Start Date End Date Taking? Authorizing Provider  pomalidomide (POMALYST) 4 MG capsule Take 1 capsule (4 mg total) by mouth daily. Take 1 capsule for 21 days then off for 7 days 06/18/22   Orson Slick, MD  acetaminophen (TYLENOL) 325 MG tablet Take 2 tablets (650 mg total) by mouth every 6 (six) hours as needed. 03/15/22   Jeanell Sparrow, DO  acyclovir (ZOVIRAX) 400 MG tablet Take 1 tablet (400 mg total) by mouth 2 (two) times daily. 12/10/21   Orson Slick, MD   amiodarone (PACERONE) 200 MG tablet Take 1 tablet (200 mg total) by mouth daily. 05/12/22   Vickie Epley, MD  amLODipine (NORVASC) 10 MG tablet Take 10 mg by mouth daily.     [provider]  cholecalciferol (VITAMIN D3) 25 MCG (1000 UNIT) tablet Take 1,000 Units by mouth daily.    [provider]  clobetasol cream (TEMOVATE) [id number] % Apply 1 Application topically daily as needed (rash). 08/05/21   [provider]  Cyanocobalamin (VITAMIN B-12 PO) Take 1 tablet by mouth daily. Not sure the dosage    [provider]  fluticasone (FLONASE) 50 MCG/ACT nasal spray Place 1 spray into both nostrils daily as needed for allergies or rhinitis.    [provider]  folic acid (FOLVITE) 1 MG tablet Take 1 mg by mouth daily. 11/12/19   [provider]  gabapentin (NEURONTIN) 300 MG capsule TAKE TWO CAPSULES BY MOUTH TWICE A DAY Patient taking differently:  Take 600 mg by mouth 2 (two) times daily. 03/29/22   Narda Amber K, DO  hydrALAZINE (APRESOLINE) 25 MG tablet Take 1.5 tablets (37.5 mg total) by mouth 3 (three) times daily. Patient taking differently: Take 50 mg by mouth 3 (three) times daily. 05/12/22   Vickie Epley, MD  lidocaine (LIDODERM) 5 % Place 1 patch onto the skin daily as needed. Remove & Discard patch within 12 hours or as directed by MD 03/15/22   Wynona Dove A, DO  lisinopril (ZESTRIL) 40 MG tablet TAKE ONE TABLET BY MOUTH ONE TIME DAILY Patient taking differently: Take 40 mg by mouth in the morning and at bedtime. 08/12/21   Fay Records, MD  Menthol-Camphor (ICY HOT ADVANCED PAIN RELIEF) 16-11 % CREA Apply 1 Application topically at bedtime as needed (BACK PAIN).    [provider]  Omega-3 Fatty Acids (FISH OIL PO) Take 1 Capful by mouth daily.    [provider]  sodium chloride (OCEAN) 0.65 % SOLN nasal spray Place 1 spray into both nostrils as needed for congestion.    [provider]  traMADol  (ULTRAM) 50 MG tablet Take 1 tablet (50 mg total) by mouth every 6 (six) hours as needed. 05/18/22   Dede Query T, PA-C  triamcinolone cream (KENALOG) 0.1 % Apply 1 application topically daily as needed (itching). 06/05/19   [provider]   Physical Exam: BP (!) 155/79   Pulse 91   Temp 98.3 F (36.8 C) (Oral)   Resp 19   SpO2 98%   General:  Alert, oriented, calm, in no acute distress  Eyes: EOMI, clear conjuctivae, white sclerea Neck: supple, no masses, trachea mildline  Cardiovascular: Irregularly irregular, no murmurs or rubs, nonpitting left leg edema up to the left thigh, nontender Respiratory: clear to auscultation bilaterally, no wheezes, no crackles  Abdomen: soft, nontender, nondistended, normal bowel tones heard  Skin: dry, no rashes  Musculoskeletal: no joint effusions, normal range of motion  Psychiatric: appropriate affect, normal speech  Neurologic: extraocular muscles intact, clear speech, moving all extremities with intact sensorium          Labs on Admission:  Basic Metabolic Panel: Recent Labs  Lab 06/21/22 0800  NA 135  K 3.0*  CL 104  CO2 26  GLUCOSE 103*  BUN 16  CREATININE 0.75  CALCIUM 7.6*   Liver Function Tests: No results for input(s): "AST", "ALT", "ALKPHOS", "BILITOT", "PROT", "ALBUMIN" in the last 168 hours. No results for input(s): "LIPASE", "AMYLASE" in the last 168 hours. No results for input(s): "AMMONIA" in the last 168 hours. CBC: Recent Labs  Lab 06/21/22 0800  WBC 7.0  HGB 10.3*  HCT 31.6*  MCV 100.6*  PLT 165   Cardiac Enzymes: No results for input(s): "CKTOTAL", "CKMB", "CKMBINDEX", "TROPONINI" in the last 168 hours.  BNP (last 3 results) No results for input(s): "BNP" in the last 8760 hours.  ProBNP (last 3 results) Recent Labs    11/17/21 1513  PROBNP 232    CBG: No results for input(s): "GLUCAP" in the last 168 hours.  Radiological Exams on Admission: DG Thoracic Spine 2 View  Result Date:  06/21/2022 CLINICAL DATA:  Trauma EXAM: THORACIC SPINE 2 VIEWS COMPARISON:  Cone beam CT 06/04/22 FINDINGS: Assessment is limited due to patient positioning. Post surgical changes from fusion at the cervicothoracic junction. Hardware is intact with unchanged alignment compared to immediate post-operative cone-beam CT. There is no evidence of thoracic spine fracture. No other significant bone  abnormalities are identified. IMPRESSION: Limited assessment due to patient positioning. Within this limitation - no evidence of thoracic spine fracture. If there is high clinical concern fro a cervical or thoracic spine injury, further evaluation with CT is recommended. Electronically Signed   By: Marin Roberts M.D.   On: 06/21/2022 10:55   VAS [place] LOWER EXTREMITY VENOUS (DVT) (7a-7p)  Result Date: 06/21/2022  Lower Venous DVT Study Patient Name:  [facility]  Date of Exam:   06/21/2022 Medical Rec #: [id number]         Accession #:    [id number] Date of Birth: February 14, 1951        Patient Gender: M Patient Age:   62 years Exam Location:  [facility] Procedure:      VAS [place] LOWER EXTREMITY VENOUS (DVT) Referring Phys: Wille Glaser KNAPP --------------------------------------------------------------------------------  Indications: Asymmetrical swelling x2 weeks LT>RT, s/p thoracic spine surgery 06/04/2022.  Risk Factors: History of DVT. Anticoagulation: Eliquis. Limitations: Poor ultrasound/tissue interface. Comparison Study: No prior studies available. Performing Technologist: Darlin Coco RDMS, RVT  Examination Guidelines: A complete evaluation includes B-mode imaging, spectral Doppler, color Doppler, and power Doppler as needed of all accessible portions of each vessel. Bilateral testing is considered an integral part of a complete examination. Limited examinations for reoccurring indications may be performed as noted. The reflux portion of the exam is performed with the patient in reverse Trendelenburg.   +-----+---------------+---------+-----------+----------+--------------+ RIGHTCompressibilityPhasicitySpontaneityPropertiesThrombus Aging +-----+---------------+---------+-----------+----------+--------------+ CFV  Full           Yes      Yes                                 +-----+---------------+---------+-----------+----------+--------------+   +---------+---------------+---------+-----------+----------+--------------+ LEFT     CompressibilityPhasicitySpontaneityPropertiesThrombus Aging +---------+---------------+---------+-----------+----------+--------------+ CFV      Full           Yes      Yes                                 +---------+---------------+---------+-----------+----------+--------------+ SFJ      Full                                                        +---------+---------------+---------+-----------+----------+--------------+ FV Prox  Full                                                        +---------+---------------+---------+-----------+----------+--------------+ FV Mid   Full                                                        +---------+---------------+---------+-----------+----------+--------------+ FV DistalFull                                                        +---------+---------------+---------+-----------+----------+--------------+  PFV      Full                                                        +---------+---------------+---------+-----------+----------+--------------+ POP      Full           Yes      Yes                                 +---------+---------------+---------+-----------+----------+--------------+ PTV      Full                                                        +---------+---------------+---------+-----------+----------+--------------+ PERO     Partial        No       No                   Acute           +---------+---------------+---------+-----------+----------+--------------+     Summary: RIGHT: - No evidence of common femoral vein obstruction.  LEFT: - Findings consistent with acute deep vein thrombosis involving the left peroneal veins. - No cystic structure found in the popliteal fossa.  *See table(s) above for measurements and observations. Electronically signed by Servando Snare MD on 06/21/2022 at 10:46:28 AM.    Final    DG Chest 2 View  Result Date: 06/21/2022 CLINICAL DATA:  Weakness EXAM: CHEST - 2 VIEW COMPARISON:  CXR 03/10/22 FINDINGS: Assessment of the bilateral lung apices limited due to patient positioning/chin down technique. No pleural effusion. No pneumothorax. No focal airspace opacity. Unchanged cardiac and mediastinal contours. Visualized upper abdomen is unremarkable. Possible age indeterminate fracture of the anterior/lateral left sixth rib. Visualized upper abdomen is unremarkable. IMPRESSION: 1. Possible age indeterminate fracture of the anterolateral left sixth rib. Correlate with point tenderness. 2. No focal airspace opacity. Electronically Signed   By: Marin Roberts M.D.   On: 06/21/2022 08:38    Assessment/Plan Principal Problem:   Atrial fibrillation with RVR ([place]) he has a known history of-paroxysmal atrial fibrillation, unclear why his A-fib is exacerbated at this time, could be due to his DVT or recent fall.  He is not hypoxic, has no pleuritic chest pain and so chest imaging is unlikely to alter management at this point. -Inpatient admission -Continue home Eliquis -Resume home amiodarone, as well as IV Cardizem drip -Obtain 2D echo (as heart strain would change management, though very low suspicion for this) -Check magnesium level Active Problems:   Hypertension -continue home hydralazine, hold lisinopril and amlodipine due to rapid A-fib   Obesity (BMI 30-39.9) -getting all aspects of care   Paroxysmal atrial fibrillation (HCC)   Smoldering multiple myeloma  -management per oncology   GERD (gastroesophageal reflux disease)   Acute DVT of left tibial vein (HCC) -presumably instigated by discontinuation of Eliquis due to spinal surgery.  Continue home Eliquis.   Hypokalemia - repleated, will recheck in the morning  DVT prophylaxis: Eliquis  Code Status: Full code  Family Communication: No family present, patient is alert and oriented x 4.  Disposition Plan:  Anticipate patient will likely go home when medically clear, will consult PT/OT.  Consults called: None  Admission status: Inpatient  Time spent: 43 minutes  Leona Alen Neva Seat MD [facility] Pager [phone]  If 7PM-7AM, please contact night-coverage [email or web address] Password [email or web address]  06/21/2022, 12:12 PM

## 2022-06-21 NOTE — Evaluation (Signed)
Physical Therapy Evaluation Patient Details Name: Travis [name] MRN: [id number] DOB: Dec 27, 1950 Today's Date: 06/21/2022  History of Present Illness  Travis [name] is a 72 y.o. male presents after fall at home, admitted with afib with RVR. Pt also found to have acute LLE DVT. Of note, pt s/p decompression of T2 myeloma of vertebral body with reconstruction T1-T4 on 2/2 with Dr. Christella Noa. PMH: multiple myeloma, PAF on chronic anticoagulation, GERD, HTN, OSA on CPAP, history of DVT  Clinical Impression  Pt admitted with above diagnosis. Pt from home with spouse, recent back surgery and using RW for household distances and sleeping in lift chair/recliner, active with HHPT and HHOT since discharge and pt reports he resumed home eliquis after surgery. Pt currently needing mod A with bed mobility and cues for log rolling. Min guard for transfers and in room ambulation to restroom using RW, adequate bil dorsiflexion and foot clearance, no overt LOB, kyphotic posture with eye gaze down, decreased cadence. Pt c/o pain in upper back muscles and L calf/shin area with increased numbness in that area; pt reports history of bil foot numbness. Pt denies chest pain, dizziness and no SOB noted; HR 74-80 with mobility. Recommend resume HHPT once d/c home with spousal support. Pt currently with functional limitations due to the deficits listed below (see PT Problem List). Pt will benefit from skilled PT to increase their independence and safety with mobility to allow discharge to the venue listed below.          Recommendations for follow up therapy are one component of a multi-disciplinary discharge planning process, led by the attending physician.  Recommendations may be updated based on patient status, additional functional criteria and insurance authorization.  Follow Up Recommendations Home health PT      Assistance Recommended at Discharge Frequent or constant Supervision/Assistance  Patient can return  home with the following  A little help with walking and/or transfers;A little help with bathing/dressing/bathroom;Assistance with cooking/housework;Assist for transportation;Help with stairs or ramp for entrance    Equipment Recommendations None recommended by PT  Recommendations for Other Services       Functional Status Assessment Patient has had a recent decline in their functional status and demonstrates the ability to make significant improvements in function in a reasonable and predictable amount of time.     Precautions / Restrictions Precautions Precautions: Fall;Cervical Restrictions Weight Bearing Restrictions: No      Mobility  Bed Mobility Overal bed mobility: Needs Assistance Bed Mobility: Rolling, Sidelying to Sit, Sit to Sidelying Rolling: Min assist Sidelying to sit: Mod assist  Sit to sidelying: Mod assist General bed mobility comments: min A to roll to side and mod A to upright into sitting, heavy verbal cues for sequencing with log rolling; mod A to lift BLE back into bed via log roll    Transfers Overall transfer level: Needs assistance Equipment used: Rolling walker (2 wheels) Transfers: Sit to/from Stand Sit to Stand: Min guard  General transfer comment: min guard to power to stand from EOB and toilet, VC for hand placement    Ambulation/Gait Ambulation/Gait assistance: Min guard Gait Distance (Feet): 15 Feet (x2) Assistive device: Rolling walker (2 wheels) Gait Pattern/deviations: Step-through pattern, Decreased stride length, Trunk flexed Gait velocity: decreased  General Gait Details: pt ambulates to toilet then back to EOB, kyphotic upright posture with eye gaze down, adequate bil dorsiflexion and foot clearance, limited by pain complaints in posture muscles  Stairs  Wheelchair Mobility    Modified Rankin (Stroke Patients Only)       Balance Overall balance assessment: Needs assistance  Sitting balance-Leahy Scale:  Fair Sitting balance - Comments: UE support on EOB  Standing balance support: During functional activity, Reliant on assistive device for balance Standing balance-Leahy Scale: Poor Standing balance comment: single to BUE support on RW       Pertinent Vitals/Pain Pain Assessment Pain Assessment: 0-10 Pain Score: 4  Pain Location: "back muscles where I had surgery" and L foot Pain Descriptors / Indicators: Aching, Sore Pain Intervention(s): Limited activity within patient's tolerance, Monitored during session    Home Living Family/patient expects to be discharged to:: Private residence Living Arrangements: Spouse/significant other Available Help at Discharge: Family;Available 24 hours/day Type of Home: House Home Access: Stairs to enter Entrance Stairs-Rails: Right Entrance Stairs-Number of Steps: 4 Alternate Level Stairs-Number of Steps: flight Home Layout: Two level;Able to live on main level with bedroom/bathroom Home Equipment: Rolling Walker (2 wheels);Cane - single point;Shower seat - built in;Adaptive equipment;Hand held shower head      Prior Function Prior Level of Function : Working/employed;Driving;Needs assist  Mobility Comments: pt reports using RW since surgery for limited distances around the home, sleeping in lift chair/recliner ADLs Comments: since surgery spouse assisting at needed     Hand Dominance   Dominant Hand: Right    Extremity/Trunk Assessment   Upper Extremity Assessment Upper Extremity Assessment: Defer to OT evaluation    Lower Extremity Assessment RLE Deficits / Details: AROM WFL, strength grossly 4/5, skin discoloration noted along base of foot RLE Sensation: history of peripheral neuropathy RLE Coordination: WNL LLE Deficits / Details: hip and knee AROM WFL, strength grossly 3+/5 due to pain in calf, skin discoloration noted along base of foot LLE Sensation: history of peripheral neuropathy LLE Coordination: WNL    Cervical / Trunk  Assessment Cervical / Trunk Assessment: Kyphotic (wound visible without dressing)  Communication   Communication: No difficulties  Cognition Arousal/Alertness: Awake/alert Behavior During Therapy: WFL for tasks assessed/performed Overall Cognitive Status: Within Functional Limits for tasks assessed     General Comments General comments (skin integrity, edema, etc.): HR 74-80 with mobility without SOB, no chest pain, no lightheadedness    Exercises     Assessment/Plan    PT Assessment Patient needs continued PT services  PT Problem List Decreased strength;Decreased activity tolerance;Decreased balance;Decreased mobility;Decreased knowledge of use of DME;Decreased safety awareness;Decreased knowledge of precautions;Pain;Impaired sensation       PT Treatment Interventions DME instruction;Gait training;Stair training;Functional mobility training;Therapeutic activities;Therapeutic exercise;Balance training;Neuromuscular re-education;Patient/family education    PT Goals (Current goals can be found in the Care Plan section)  Acute Rehab PT Goals Patient Stated Goal: less pain PT Goal Formulation: With patient Time For Goal Achievement: 07/05/22 Potential to Achieve Goals: Good    Frequency       Co-evaluation               AM-PAC PT "6 Clicks" Mobility  Outcome Measure Help needed turning from your back to your side while in a flat bed without using bedrails?: A Little Help needed moving from lying on your back to sitting on the side of a flat bed without using bedrails?: A Lot Help needed moving to and from a bed to a chair (including a wheelchair)?: A Little Help needed standing up from a chair using your arms (e.g., wheelchair or bedside chair)?: A Little Help needed to walk in hospital room?: A Little Help needed climbing  3-5 steps with a railing? : A Lot 6 Click Score: 16    End of Session Equipment Utilized During Treatment: Gait belt Activity Tolerance: Patient  tolerated treatment well;Patient limited by pain Patient left: in bed;with call bell/phone within reach;with bed alarm set;with family/visitor present Nurse Communication: Mobility status (skin discoloration on bil feet) PT Visit Diagnosis: Muscle weakness (generalized) (M62.81);Difficulty in walking, not elsewhere classified (R26.2);Pain Pain - part of body:  (upper back, LLE)    Time: [id number] PT Time Calculation (min) (ACUTE ONLY): 28 min   Charges:   PT Evaluation $PT Eval Moderate Complexity: 1 Mod PT Treatments $Therapeutic Activity: 8-22 mins         Tori Rozann Holts PT, DPT 06/21/22, 4:00 PM

## 2022-06-21 NOTE — ED Provider Notes (Signed)
Travis [name] EMERGENCY DEPARTMENT AT [facility] Provider Note   CSN: [id number] Arrival date & time: 06/21/22  0720     History  Chief Complaint  Patient presents with   Fall   Back Pain    Travis [name] is a 72 y.o. male.   Fall  Back Pain    Patient has history of multiple medical problems including atrial fibrillation, multiple myeloma, hiatal hernia, DVT, extensive tumor involvement of the spine relating to the multiple myeloma.  Patient recently was in the hospital earlier this month when he had 4 posterior lateral arthrodesis and fusion and thoracic corpectomy of the second thoracic spine.  Patient states he has been recovering at home.  This morning he had increasing pain after a fall.  He was attempting to go to the toilet and missing it and falling down.  Patient states he did notice his ankle was more swollen this morning and he felt that his leg was numb and somewhat gave way.  He denies any headache.  No chest pain.  No abdominal pain.  No shortness of breath  Home Medications Prior to Admission medications   Medication Sig Start Date End Date Taking? Authorizing Provider  pomalidomide (POMALYST) 4 MG capsule Take 1 capsule (4 mg total) by mouth daily. Take 1 capsule for 21 days then off for 7 days 06/18/22   Orson Slick, MD  acetaminophen (TYLENOL) 325 MG tablet Take 2 tablets (650 mg total) by mouth every 6 (six) hours as needed. 03/15/22   Jeanell Sparrow, DO  acyclovir (ZOVIRAX) 400 MG tablet Take 1 tablet (400 mg total) by mouth 2 (two) times daily. 12/10/21   Orson Slick, MD  amiodarone (PACERONE) 200 MG tablet Take 1 tablet (200 mg total) by mouth daily. 05/12/22   Vickie Epley, MD  amLODipine (NORVASC) 10 MG tablet Take 10 mg by mouth daily.     [provider]  cholecalciferol (VITAMIN D3) 25 MCG (1000 UNIT) tablet Take 1,000 Units by mouth daily.    [provider]  clobetasol cream (TEMOVATE) [id number] % Apply 1  Application topically daily as needed (rash). 08/05/21   [provider]  Cyanocobalamin (VITAMIN B-12 PO) Take 1 tablet by mouth daily. Not sure the dosage    [provider]  fluticasone (FLONASE) 50 MCG/ACT nasal spray Place 1 spray into both nostrils daily as needed for allergies or rhinitis.    [provider]  folic acid (FOLVITE) 1 MG tablet Take 1 mg by mouth daily. 11/12/19   [provider]  gabapentin (NEURONTIN) 300 MG capsule TAKE TWO CAPSULES BY MOUTH TWICE A DAY Patient taking differently: Take 600 mg by mouth 2 (two) times daily. 03/29/22   Narda Amber K, DO  hydrALAZINE (APRESOLINE) 25 MG tablet Take 1.5 tablets (37.5 mg total) by mouth 3 (three) times daily. Patient taking differently: Take 50 mg by mouth 3 (three) times daily. 05/12/22   Vickie Epley, MD  lidocaine (LIDODERM) 5 % Place 1 patch onto the skin daily as needed. Remove & Discard patch within 12 hours or as directed by MD 03/15/22   Wynona Dove A, DO  lisinopril (ZESTRIL) 40 MG tablet TAKE ONE TABLET BY MOUTH ONE TIME DAILY Patient taking differently: Take 40 mg by mouth in the morning and at bedtime. 08/12/21   Fay Records, MD  Menthol-Camphor (ICY HOT ADVANCED PAIN RELIEF) 16-11 % CREA Apply 1 Application topically at bedtime as needed (  BACK PAIN).    [provider]  Omega-3 Fatty Acids (FISH OIL PO) Take 1 Capful by mouth daily.    [provider]  sodium chloride (OCEAN) 0.65 % SOLN nasal spray Place 1 spray into both nostrils as needed for congestion.    [provider]  traMADol (ULTRAM) 50 MG tablet Take 1 tablet (50 mg total) by mouth every 6 (six) hours as needed. 05/18/22   Dede Query T, PA-C  triamcinolone cream (KENALOG) 0.1 % Apply 1 application topically daily as needed (itching). 06/05/19   [provider]      Allergies    Linezolid and Vancomycin    Review of Systems   Review of Systems  Musculoskeletal:  Positive for  back pain.    Physical Exam Updated Vital Signs BP (!) 146/99   Pulse (!) 138   Temp 98.4 F (36.9 C) (Oral)   Resp 19   SpO2 99%  Physical Exam Vitals and nursing note reviewed.  Constitutional:      Appearance: He is well-developed. He is not diaphoretic.  HENT:     Head: Normocephalic and atraumatic.     Right Ear: External ear normal.     Left Ear: External ear normal.  Eyes:     General: No scleral icterus.       Right eye: No discharge.        Left eye: No discharge.     Conjunctiva/sclera: Conjunctivae normal.  Neck:     Trachea: No tracheal deviation.  Cardiovascular:     Rate and Rhythm: Tachycardia present. Rhythm irregular.  Pulmonary:     Effort: Pulmonary effort is normal. No respiratory distress.     Breath sounds: Normal breath sounds. No stridor. No wheezing or rales.  Abdominal:     General: Bowel sounds are normal. There is no distension.     Palpations: Abdomen is soft.     Tenderness: There is no abdominal tenderness. There is no guarding or rebound.  Musculoskeletal:        General: Swelling present. No tenderness or deformity.     Cervical back: Neck supple.     Comments: Edema noted left lower extremity, warm and well-perfused, no erythema  Skin:    General: Skin is warm and dry.     Findings: No rash.  Neurological:     General: No focal deficit present.     Mental Status: He is alert.     Cranial Nerves: No cranial nerve deficit, dysarthria or facial asymmetry.     Sensory: No sensory deficit.     Motor: No abnormal muscle tone or seizure activity.     Coordination: Coordination normal.     Comments: 5 out of 5 plantarflexion dorsiflexion bilaterally,   Psychiatric:        Mood and Affect: Mood normal.     ED Results / Procedures / Treatments   Labs (all labs ordered are listed, but only abnormal results are displayed) Labs Reviewed  CBC - Abnormal; Notable for the following components:      Result Value   RBC 3.14 (*)     Hemoglobin 10.3 (*)    HCT 31.6 (*)    MCV 100.6 (*)    RDW 17.0 (*)    All other components within normal limits  BASIC METABOLIC PANEL - Abnormal; Notable for the following components:   Potassium 3.0 (*)    Glucose, Bld 103 (*)    Calcium 7.6 (*)  All other components within normal limits    EKG EKG Interpretation  Date/Time:  Monday June 21 2022 08:07:21 EST Ventricular Rate:  136 PR Interval:    QRS Duration: 96 QT Interval:  347 QTC Calculation: 522 R Axis:   80 Text Interpretation: Junctional tachycardia Minimal ST depression, inferior leads Prolonged QT interval Confirmed by Dorie Rank [phone]) on 06/21/2022 8:26:09 AM  Radiology DG Thoracic Spine 2 View  Result Date: 06/21/2022 CLINICAL DATA:  Trauma EXAM: THORACIC SPINE 2 VIEWS COMPARISON:  Cone beam CT 06/04/22 FINDINGS: Assessment is limited due to patient positioning. Post surgical changes from fusion at the cervicothoracic junction. Hardware is intact with unchanged alignment compared to immediate post-operative cone-beam CT. There is no evidence of thoracic spine fracture. No other significant bone abnormalities are identified. IMPRESSION: Limited assessment due to patient positioning. Within this limitation - no evidence of thoracic spine fracture. If there is high clinical concern fro a cervical or thoracic spine injury, further evaluation with CT is recommended. Electronically Signed   By: Marin Roberts M.D.   On: 06/21/2022 10:55   VAS [place] LOWER EXTREMITY VENOUS (DVT) (7a-7p)  Result Date: 06/21/2022  Lower Venous DVT Study Patient Name:  JAMOL REIM [facility]  Date of Exam:   06/21/2022 Medical Rec #: [id number]         Accession #:    [id number] Date of Birth: 01-19-1951        Patient Gender: M Patient Age:   22 years Exam Location:  [facility] Procedure:      VAS [place] LOWER EXTREMITY VENOUS (DVT) Referring Phys: Wille Glaser Estus Krakowski --------------------------------------------------------------------------------   Indications: Asymmetrical swelling x2 weeks LT>RT, s/p thoracic spine surgery 06/04/2022.  Risk Factors: History of DVT. Anticoagulation: Eliquis. Limitations: Poor ultrasound/tissue interface. Comparison Study: No prior studies available. Performing Technologist: Darlin Coco RDMS, RVT  Examination Guidelines: A complete evaluation includes B-mode imaging, spectral Doppler, color Doppler, and power Doppler as needed of all accessible portions of each vessel. Bilateral testing is considered an integral part of a complete examination. Limited examinations for reoccurring indications may be performed as noted. The reflux portion of the exam is performed with the patient in reverse Trendelenburg.  +-----+---------------+---------+-----------+----------+--------------+ RIGHTCompressibilityPhasicitySpontaneityPropertiesThrombus Aging +-----+---------------+---------+-----------+----------+--------------+ CFV  Full           Yes      Yes                                 +-----+---------------+---------+-----------+----------+--------------+   +---------+---------------+---------+-----------+----------+--------------+ LEFT     CompressibilityPhasicitySpontaneityPropertiesThrombus Aging +---------+---------------+---------+-----------+----------+--------------+ CFV      Full           Yes      Yes                                 +---------+---------------+---------+-----------+----------+--------------+ SFJ      Full                                                        +---------+---------------+---------+-----------+----------+--------------+ FV Prox  Full                                                        +---------+---------------+---------+-----------+----------+--------------+  FV Mid   Full                                                        +---------+---------------+---------+-----------+----------+--------------+ FV DistalFull                                                         +---------+---------------+---------+-----------+----------+--------------+ PFV      Full                                                        +---------+---------------+---------+-----------+----------+--------------+ POP      Full           Yes      Yes                                 +---------+---------------+---------+-----------+----------+--------------+ PTV      Full                                                        +---------+---------------+---------+-----------+----------+--------------+ PERO     Partial        No       No                   Acute          +---------+---------------+---------+-----------+----------+--------------+     Summary: RIGHT: - No evidence of common femoral vein obstruction.  LEFT: - Findings consistent with acute deep vein thrombosis involving the left peroneal veins. - No cystic structure found in the popliteal fossa.  *See table(s) above for measurements and observations. Electronically signed by Servando Snare MD on 06/21/2022 at 10:46:28 AM.    Final    DG Chest 2 View  Result Date: 06/21/2022 CLINICAL DATA:  Weakness EXAM: CHEST - 2 VIEW COMPARISON:  CXR 03/10/22 FINDINGS: Assessment of the bilateral lung apices limited due to patient positioning/chin down technique. No pleural effusion. No pneumothorax. No focal airspace opacity. Unchanged cardiac and mediastinal contours. Visualized upper abdomen is unremarkable. Possible age indeterminate fracture of the anterior/lateral left sixth rib. Visualized upper abdomen is unremarkable. IMPRESSION: 1. Possible age indeterminate fracture of the anterolateral left sixth rib. Correlate with point tenderness. 2. No focal airspace opacity. Electronically Signed   By: Marin Roberts M.D.   On: 06/21/2022 08:38    Procedures Procedures    Medications Ordered in ED Medications  sodium chloride 0.9 % bolus 500 mL (500 mLs Intravenous New Bag/Given 06/21/22 1030)     Followed by  0.9 %  sodium chloride infusion (has no administration in time range)  diltiazem (CARDIZEM) 1 mg/mL load via infusion 15 mg (15 mg Intravenous Bolus from Bag 06/21/22 0930)    And  diltiazem (CARDIZEM) 125 mg in dextrose 5% 125 mL (  1 mg/mL) infusion (10 mg/hr Intravenous Rate/Dose Change 06/21/22 1035)  potassium chloride 10 mEq in 100 mL IVPB (10 mEq Intravenous New Bag/Given 06/21/22 1036)  morphine (PF) 4 MG/ML injection 4 mg (4 mg Intravenous Given 06/21/22 0940)  potassium chloride SA (KLOR-CON M) CR tablet 40 mEq (40 mEq Oral Given 06/21/22 1033)    ED Course/ Medical Decision Making/ A&P Clinical Course as of 06/21/22 1108  Mon Jun 21, 2022  1020 CBC(!) Hemoglobin increased compared to previous [JK]  [id number] Basic metabolic panel(!) Hypokalemia noted [JK]  1021 X-ray shows age-indeterminate rib fracture. [JK]  1105 Case discussed with Dr Renaee Munda regarding admission [JK]    Clinical Course User Index [JK] Dorie Rank, MD         CHA2DS2-VASc Score: 2                    Medical Decision Making Problems Addressed: Acute deep vein thrombosis (DVT) of left peroneal vein [facility]): acute illness or injury that poses a threat to life or bodily functions Atrial fibrillation with rapid ventricular response [facility]): chronic illness or injury with exacerbation, progression, or side effects of treatment that poses a threat to life or bodily functions Hypokalemia: acute illness or injury  Amount and/or Complexity of Data Reviewed Labs: ordered. Decision-making details documented in ED Course. Radiology: ordered.    Details: Doppler study is positive for DVT.  Suspect this was related to the fact the patient was off his anticoagulant in preparation of his surgery  Risk Prescription drug management. Decision regarding hospitalization.   Patient presented to the ED for evaluation of weakness and fall in the setting of recent spine surgery.  Patient has known history of multiple myeloma  with lesions involving his spine.  Patient also noticed leg swelling.  He denies any chest pain or shortness of breath.  ED workup notable for an acute DVT.  Patient is on anticoagulation but he did stop it in preparation of his surgery.  I suspect this is the likely etiology and not necessarily a failed response to his anticoagulation medication.  Patient noted to be in rapid A-fib.  Fortunately is not in any distress.  We will continue Cardizem infusion for rate control.  No signs of any acute fractures associated with his fall.  Hypokalemia noted on labs and IV and oral potassium has been ordered.  I have spoken with Dr. Renaee Munda regarding admission for further treatment        Final Clinical Impression(s) / ED Diagnoses Final diagnoses:  Atrial fibrillation with rapid ventricular response ([place])  Hypokalemia  Acute deep vein thrombosis (DVT) of left peroneal vein [facility])    Rx / DC Orders ED Discharge Orders     None         Dorie Rank, MD 06/21/22 1108

## 2022-06-21 NOTE — Telephone Encounter (Signed)
2/19 @ 8:40 am patient call to canceled appts for today due to a fall and currently in [facility].  Sent message to Dr. Antony Blackbird, so they are aware.  All appts including CT Sim has be canceled at this time.

## 2022-06-21 NOTE — ED Triage Notes (Signed)
Pt BIB EMS from home, c/o increased back pain after fall. Hx of cancer, recent T2 vertebra surgery. Per EMS, sat down to toilet and missed. No injury or LOC. Pitting edema noted, current lasix and eliquis.   BP 130/90 HR 139 spO2 98% RA RR 18

## 2022-06-21 NOTE — Progress Notes (Signed)
Lower extremity venous left study completed.  Preliminary results relayed to Tomi Bamberger, MD.   See CV Proc for preliminary results report.   Darlin Coco, RDMS, RVT

## 2022-06-22 ENCOUNTER — Encounter: Payer: Self-pay | Admitting: Hematology and Oncology

## 2022-06-22 ENCOUNTER — Other Ambulatory Visit (HOSPITAL_COMMUNITY): Payer: Self-pay

## 2022-06-22 ENCOUNTER — Telehealth: Payer: Self-pay | Admitting: Internal Medicine

## 2022-06-22 ENCOUNTER — Inpatient Hospital Stay (HOSPITAL_BASED_OUTPATIENT_CLINIC_OR_DEPARTMENT_OTHER): Payer: Medicare Other

## 2022-06-22 DIAGNOSIS — I4891 Unspecified atrial fibrillation: Secondary | ICD-10-CM

## 2022-06-22 LAB — ECHOCARDIOGRAM COMPLETE
Area-P 1/2: 4.21 cm2
Height: 70 in
S' Lateral: 3.3 cm
Weight: 3908.31 oz

## 2022-06-22 LAB — CBC
HCT: 28 % — ABNORMAL LOW (ref 39.0–52.0)
Hemoglobin: 9 g/dL — ABNORMAL LOW (ref 13.0–17.0)
MCH: 32.5 pg (ref 26.0–34.0)
MCHC: 32.1 g/dL (ref 30.0–36.0)
MCV: 101.1 fL — ABNORMAL HIGH (ref 80.0–100.0)
Platelets: 139 10*3/uL — ABNORMAL LOW (ref 150–400)
RBC: 2.77 MIL/uL — ABNORMAL LOW (ref 4.22–5.81)
RDW: 17.2 % — ABNORMAL HIGH (ref 11.5–15.5)
WBC: 6.6 10*3/uL (ref 4.0–10.5)
nRBC: 0 % (ref 0.0–0.2)

## 2022-06-22 LAB — COMPREHENSIVE METABOLIC PANEL
ALT: 19 U/L (ref 0–44)
AST: 12 U/L — ABNORMAL LOW (ref 15–41)
Albumin: 2.5 g/dL — ABNORMAL LOW (ref 3.5–5.0)
Alkaline Phosphatase: 74 U/L (ref 38–126)
Anion gap: 8 (ref 5–15)
BUN: 15 mg/dL (ref 8–23)
CO2: 25 mmol/L (ref 22–32)
Calcium: 7.8 mg/dL — ABNORMAL LOW (ref 8.9–10.3)
Chloride: 106 mmol/L (ref 98–111)
Creatinine, Ser: 0.9 mg/dL (ref 0.61–1.24)
GFR, Estimated: >60 mL/min (ref >60)
Glucose, Bld: 102 mg/dL — ABNORMAL HIGH (ref 70–99)
Potassium: 3.1 mmol/L — ABNORMAL LOW (ref 3.5–5.1)
Sodium: 139 mmol/L (ref 135–145)
Total Bilirubin: 0.4 mg/dL (ref 0.3–1.2)
Total Protein: 4.6 g/dL — ABNORMAL LOW (ref 6.5–8.1)

## 2022-06-22 MED ORDER — HYDROCODONE-ACETAMINOPHEN 7.5-325 MG PO TABS
1.0000 | ORAL_TABLET | Freq: Four times a day (QID) | ORAL | Status: DC | PRN
  Administered 2022-06-22: 1 via ORAL
  Filled 2022-06-22: qty 1

## 2022-06-22 MED ORDER — METHOCARBAMOL 500 MG PO TABS
750.0000 mg | ORAL_TABLET | Freq: Three times a day (TID) | ORAL | Status: DC
  Administered 2022-06-22: 750 mg via ORAL
  Filled 2022-06-22: qty 2

## 2022-06-22 MED ORDER — PERFLUTREN LIPID MICROSPHERE
1.0000 mL | INTRAVENOUS | Status: AC | PRN
  Administered 2022-06-22: 2 mL via INTRAVENOUS

## 2022-06-22 MED ORDER — FUROSEMIDE 40 MG PO TABS: 40.0000 mg | ORAL_TABLET | Freq: Every day | ORAL | Status: DC | PRN

## 2022-06-22 MED ORDER — SALINE SPRAY 0.65 % NA SOLN
1.0000 | NASAL | Status: DC | PRN
  Filled 2022-06-22: qty 44

## 2022-06-22 MED ORDER — AMIODARONE HCL 200 MG PO TABS: 200.0000 mg | ORAL_TABLET | Freq: Every day | ORAL | Status: DC

## 2022-06-22 MED ORDER — APIXABAN 5 MG PO TABS
10.0000 mg | ORAL_TABLET | Freq: Two times a day (BID) | ORAL | Status: DC
  Administered 2022-06-22: 10 mg via ORAL
  Filled 2022-06-22: qty 2

## 2022-06-22 MED ORDER — POTASSIUM CHLORIDE CRYS ER 20 MEQ PO TBCR
40.0000 meq | EXTENDED_RELEASE_TABLET | Freq: Every day | ORAL | 0 refills | Status: DC
  Filled 2022-06-22: qty 10, 5d supply, fill #0

## 2022-06-22 MED ORDER — POTASSIUM CHLORIDE CRYS ER 20 MEQ PO TBCR
60.0000 meq | EXTENDED_RELEASE_TABLET | Freq: Every day | ORAL | Status: DC
  Administered 2022-06-22: 60 meq via ORAL
  Filled 2022-06-22: qty 3

## 2022-06-22 MED ORDER — APIXABAN 5 MG PO TABS
5.0000 mg | ORAL_TABLET | Freq: Two times a day (BID) | ORAL | 11 refills | Status: DC
  Filled 2022-06-22: qty 60, 30d supply, fill #0

## 2022-06-22 MED ORDER — DILTIAZEM HCL ER COATED BEADS 240 MG PO CP24
240.0000 mg | ORAL_CAPSULE | Freq: Every day | ORAL | Status: DC
  Administered 2022-06-22: 240 mg via ORAL
  Filled 2022-06-22: qty 1

## 2022-06-22 MED ORDER — DILTIAZEM HCL ER COATED BEADS 240 MG PO CP24
240.0000 mg | ORAL_CAPSULE | Freq: Every day | ORAL | 2 refills | Status: DC
  Filled 2022-06-22: qty 30, 30d supply, fill #0

## 2022-06-22 MED ORDER — APIXABAN 5 MG PO TABS
10.0000 mg | ORAL_TABLET | Freq: Two times a day (BID) | ORAL | 0 refills | Status: DC
  Filled 2022-06-22: qty 28, 7d supply, fill #0

## 2022-06-22 MED ORDER — APIXABAN 5 MG PO TABS: 5.0000 mg | ORAL_TABLET | Freq: Two times a day (BID) | ORAL | Status: DC

## 2022-06-22 NOTE — Progress Notes (Signed)
Patient to be discharged to home today. All discharge instructions including all discharge Medications reviewed with the Patient. Understanding verbalized and discharge AVS with the Patient at discharge

## 2022-06-22 NOTE — Evaluation (Signed)
Occupational Therapy Evaluation Patient Details Name: Travis [name] MRN: [id number] DOB: [birth year] Today's Date: 06/22/2022   History of Present Illness Travis [name] is a 72 y.o. male presents after fall at home, admitted with afib with RVR. Pt also found to have acute LLE DVT. Of note, pt s/p decompression of T2 myeloma of vertebral body with reconstruction T1-T4 on 2/2 with Dr. Christella Noa. PMH: multiple myeloma, PAF on chronic anticoagulation, GERD, HTN, OSA on CPAP, history of DVT   Clinical Impression   Patient is currently requiring assistance with ADLs including minimal assist with Lower body ADLs (increased assistance without adaptive equipment which pt uses at home), up to Minimal assist-setup with Upper body ADLs,  as well as  moderate assist with bed mobility and minimal assist with functional transfers to toilet.  Current level of function is below patient's typical baseline.  During this evaluation, patient was limited by generalized weakness, impaired activity tolerance, and post-op pain with edema pushing pt's head down, all of which has the potential to impact patient's safety and independence during functional mobility, as well as performance for ADLs.  Patient lives at home with his spouse, who is able to provide 24/7 supervision and assistance.  Patient demonstrates good rehab potential, and should benefit from continued skilled occupational therapy services while in acute care to maximize safety, independence and quality of life at home.  Continued occupational therapy services in the home is recommended.  ?    Recommendations for follow up therapy are one component of a multi-disciplinary discharge planning process, led by the attending physician.  Recommendations may be updated based on patient status, additional functional criteria and insurance authorization.   Follow Up Recommendations  Home health OT     Assistance Recommended at Discharge PRN  Patient can return  home with the following A little help with bathing/dressing/bathroom;Assistance with cooking/housework;Assist for transportation    Functional Status Assessment  Patient has had a recent decline in their functional status and demonstrates the ability to make significant improvements in function in a reasonable and predictable amount of time.  Equipment Recommendations  None recommended by OT    Recommendations for Other Services       Precautions / Restrictions Precautions Precautions: Fall;Cervical Precaution Comments: re-educated on precautions/log roll. Pt able to identify "BLT" and "Log roll" with minimal cues Restrictions Weight Bearing Restrictions: No      Mobility Bed Mobility Overal bed mobility: Needs Assistance Bed Mobility: Rolling, Sidelying to Sit Rolling: Min assist Sidelying to sit: Mod assist            Transfers                          Balance Overall balance assessment: History of Falls, Needs assistance Sitting-balance support: No upper extremity supported, Feet supported Sitting balance-Leahy Scale: Fair Sitting balance - Comments: UE support on EOB   Standing balance support: During functional activity, Reliant on assistive device for balance Standing balance-Leahy Scale: Fair                             ADL either performed or assessed with clinical judgement   ADL Overall ADL's : Needs assistance/impaired Eating/Feeding: Independent;Sitting   Grooming: Set up;Sitting   Upper Body Bathing: Set up;Sitting   Lower Body Bathing: Minimal assistance Lower Body Bathing Details (indicate cue type and reason): Pt has AE at home which will allow him  to be supervision level. Upper Body Dressing : Set up;Sitting Upper Body Dressing Details (indicate cue type and reason): To don posterior gown while EOB   Lower Body Dressing Details (indicate cue type and reason): pt has reacher and sock aid at home. Pt stated that wife  assists as well for ease. Pt already wear socks. Toilet Transfer: Minimal assistance;Rolling walker (2 wheels);Ambulation;Cueing for safety;Cueing for sequencing;Supervision/safety;Min guard Toilet Transfer Details (indicate cue type and reason): Pt stated LT knee buckle yesterday. Pt trained in sequencing steps and RW proximity prior to ambulation with OT demonstrating. Pt stood from EOB with cues for hand palcement and Min As. Pt ambulated with RW in hallway per pt request, 300' with Min guard initially, progressing to supervision.   Toileting - Clothing Manipulation Details (indicate cue type and reason): Pt denied need to void. Would anticipate Min As for clothing management and thorough hygiene.     Functional mobility during ADLs: Min guard;Minimal assistance;Rolling walker (2 wheels)       Vision   Vision Assessment?: No apparent visual deficits     Perception     Praxis      Pertinent Vitals/Pain Pain Assessment Pain Assessment: 0-10 Pain Score: 6  Pain Location: Back Pain Intervention(s): Limited activity within patient's tolerance, Patient requesting pain meds-RN notified, Repositioned, Premedicated before session, Monitored during session     Hand Dominance Right   Extremity/Trunk Assessment Upper Extremity Assessment Upper Extremity Assessment: LUE deficits/detail RUE Deficits / Details: Recent RT shoulder surgery per chart. Able to lift to about 80 degrees at EOB. RUE Coordination: WNL LUE Deficits / Details: Able to raise UE to about 90 degrees at EOB. LUE Coordination: WNL   Lower Extremity Assessment RLE Deficits / Details: AROM WFL, strength grossly 4/5, skin discoloration noted along base of foot RLE Sensation: history of peripheral neuropathy RLE Coordination: WNL LLE Deficits / Details: hip and knee AROM WFL, strength grossly 3+/5 due to pain in calf, skin discoloration noted along base of foot LLE Sensation: history of peripheral neuropathy LLE  Coordination: WNL   Cervical / Trunk Assessment Cervical / Trunk Assessment: Kyphotic;Other exceptions Cervical / Trunk Exceptions: Swelling to LT medial scapular area near incision with wound noted near incision. RN notified. Pt unable to pift head to neutral position possible due to edema.   Communication Communication Communication: No difficulties   Cognition Arousal/Alertness: Awake/alert Behavior During Therapy: WFL for tasks assessed/performed Overall Cognitive Status: Within Functional Limits for tasks assessed                                       General Comments       Exercises Other Exercises Other Exercises: While EOB, pt worked on slow bilat shoulder retractions x10 reps with few sec hold, shoulder rolls posteriorly, and pulling scapulas downward to promote scapular gliding. Pt performed 10 reps UE shoulder flexion to ~80-90 degrees.   Shoulder Instructions      Home Living Family/patient expects to be discharged to:: Private residence Living Arrangements: Spouse/significant other Available Help at Discharge: Family;Available 24 hours/day Type of Home: House Home Access: Stairs to enter [employer] of Steps: 4 Entrance Stairs-Rails: Right Home Layout: Two level;Able to live on main level with bedroom/bathroom Alternate Level Stairs-Number of Steps: flight   Bathroom Shower/Tub: Hospital doctor Toilet: Handicapped height Bathroom Accessibility: Yes   Home Equipment: Conservation officer, nature (2 wheels);Cane - single  point;Shower seat - built in;Adaptive equipment;Hand held shower head Adaptive Equipment: Reacher;Sock aid;Long-handled shoe horn;Long-handled sponge 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 Pt also has adapative equipment but agrees spouse helping is just easier.        OT Problem List: Decreased strength;Decreased range of motion;Impaired balance (sitting and/or standing);Obesity;Pain;Impaired sensation      OT Treatment/Interventions: Self-care/ADL training;DME and/or AE instruction;Patient/family education;Balance training;Therapeutic exercise;Therapeutic activities    OT Goals(Current goals can be found in the care plan section) Acute Rehab OT Goals Patient Stated Goal: Give me OT at home. Pt wants to optomize his independence, still working a little as an Forensic psychologist. OT Goal Formulation: With patient Time For Goal Achievement: 07/06/22 Potential to Achieve Goals: Good ADL Goals Pt Will Perform Grooming: with modified independence;standing (3/3) Pt Will Perform Lower Body Dressing: with modified independence;sit to/from stand;sitting/lateral leans;with adaptive equipment Pt Will Transfer to Toilet: with modified independence;ambulating Pt Will Perform Toileting - Clothing Manipulation and hygiene: with modified independence (Educate on adaptive options) Pt/caregiver will Perform Home Exercise Program: Increased ROM;Both right and left upper extremity;Independently (trunk and head to neutral)  OT Frequency: Min 2X/week    Co-evaluation              AM-PAC OT "6 Clicks" Daily Activity     Outcome Measure Help from another person eating meals?: None Help from another person taking care of personal grooming?: None Help from another person toileting, which includes using toliet, bedpan, or urinal?: A Little Help from another person bathing (including washing, rinsing, drying)?: A Little Help from another person to put on and taking off regular upper body clothing?: A Little Help from another person to put on and taking off regular lower body clothing?: A Little 6 Click Score:  20   End of Session Equipment Utilized During Treatment: Rolling walker (2 wheels);Gait belt Nurse Communication: Mobility status  Activity Tolerance: Patient tolerated treatment well Patient left: in chair;with call bell/phone within reach  OT Visit Diagnosis: Unsteadiness on feet (R26.81);Other abnormalities of gait and mobility (R26.89);Muscle weakness (generalized) (M62.81);Pain Pain - Right/Left: Left Pain - part of body:  (Upper back)                Time: [id number] OT Time Calculation (min): 36 min Charges:  OT General Charges $OT Visit: 1 Visit OT Evaluation $OT Eval Low Complexity: 1 Low OT Treatments $Therapeutic Activity: 8-22 mins  Travis [name], OT Acute Rehab Services Office: [phone] 06/22/2022  Travis [name] 06/22/2022, 10:53 AM

## 2022-06-22 NOTE — Telephone Encounter (Signed)
Wife called to let Dr Harrington Challenger know, the patient is in the hospital for blood clout. Wife want the dr to review  the patient information, while in the hospital. Please advise

## 2022-06-22 NOTE — Care Management CC44 (Signed)
Condition Code 44 Documentation Completed  Patient Details  Name: Travis [name] MRN: [id number] Date of Birth: 05-30-1950   Condition Code 44 given:  Yes Patient signature on Condition Code 44 notice:  Yes Documentation of 2 MD's agreement:  Yes Code 44 added to claim:  Yes    Dessa Phi, RN 06/22/2022, 12:39 PM

## 2022-06-22 NOTE — Progress Notes (Signed)
  Echocardiogram 2D Echocardiogram has been performed.  Travis [name] 06/22/2022, 8:36 AM

## 2022-06-22 NOTE — TOC Transition Note (Signed)
Transition of Care [facility]) - CM/SW Discharge Note   Patient Details  Name: Travis [name] MRN: [id number] Date of Birth: November 05, 1950  [facility] of [facility] CM/SW Contact:  Dessa Phi, RN Phone Number: 06/22/2022, 12:37 PM   Clinical Narrative:d/c home Already active w/[facility] HHPT/OT. No further CM needs.       Final next level of care: [place] Barriers to Discharge: No Barriers Identified   Patient Goals and CMS Choice      Discharge Placement                         Discharge Plan and Services Additional resources added to the After Visit Summary for                            HH Arranged: PT, OT HH Agency: [place] Date [place]: 06/22/22 Time [place]: 1237 Representative spoke with at [place]: Amy  Social Determinants of Health ([place]) Interventions SDOH Screenings   Food Insecurity: No Food Insecurity (06/21/2022)  Housing: Low Risk  (06/21/2022)  Transportation Needs: No Transportation Needs (06/21/2022)  Utilities: Not At Risk (06/21/2022)  Depression (PHQ2-9): Low Risk  (10/16/2020)  Tobacco Use: Low Risk  (06/21/2022)     Readmission Risk Interventions    06/09/2022   11:52 AM  Readmission Risk Prevention Plan  Transportation Screening Complete  PCP or Specialist Appt within 5-7 Days Complete  Home Care Screening Complete  Medication Review (RN CM) Complete

## 2022-06-22 NOTE — Discharge Instructions (Signed)
Information on my medicine - ELIQUIS (apixaban)  This medication education was reviewed with me or my healthcare representative as part of my discharge preparation.  The pharmacist that spoke with me during my hospital stay was:  Lynelle Doctor, [facility]  Why was Eliquis prescribed for you? Eliquis was prescribed to treat blood clots that may have been found in the veins of your legs (deep vein thrombosis) or in your lungs (pulmonary embolism) and to reduce the risk of them occurring again.  What do You need to know about Eliquis ? The starting dose is 10 mg (two 5 mg tablets) taken TWICE daily for the FIRST SEVEN (7) DAYS, then on 06/29/22  the dose is reduced to ONE 5 mg tablet taken TWICE daily.  Eliquis may be taken with or without food.   Try to take the dose about the same time in the morning and in the evening. If you have difficulty swallowing the tablet whole please discuss with your pharmacist how to take the medication safely.  Take Eliquis exactly as prescribed and DO NOT stop taking Eliquis without talking to the doctor who prescribed the medication.  Stopping may increase your risk of developing a new blood clot.  Refill your prescription before you run out.  After discharge, you should have regular check-up appointments with your healthcare provider that is prescribing your Eliquis.    What do you do if you miss a dose? If a dose of ELIQUIS is not taken at the scheduled time, take it as soon as possible on the same day and twice-daily administration should be resumed. The dose should not be doubled to make up for a missed dose.  Important Safety Information A possible side effect of Eliquis is bleeding. You should call your healthcare provider right away if you experience any of the following: Bleeding from an injury or your nose that does not stop. Unusual colored urine (red or dark brown) or unusual colored stools (red or black). Unusual bruising for unknown reasons. A  serious fall or if you hit your head (even if there is no bleeding).  Some medicines may interact with Eliquis and might increase your risk of bleeding or clotting while on Eliquis. To help avoid this, consult your healthcare provider or pharmacist prior to using any new prescription or non-prescription medications, including herbals, vitamins, non-steroidal anti-inflammatory drugs (NSAIDs) and supplements.  This website has more information on Eliquis (apixaban): http://www.eliquis.com/eliquis/home

## 2022-06-22 NOTE — Telephone Encounter (Signed)
Patient is scheduled for a TOC hospital f/u 07/02/22 at 1:55 pm with Ambrose Pancoast per [facility].

## 2022-06-22 NOTE — Care Management Obs Status (Signed)
[place] NOTIFICATION   Patient Details  Name: Travis [name] MRN: [id number] Date of Birth: 1950/07/11   Medicare Observation Status Notification Given:  Yes    MahabirJuliann Pulse, RN 06/22/2022, 12:39 PM

## 2022-06-22 NOTE — Discharge Summary (Signed)
Physician Discharge Summary  Travis [name] [name] DOB: 1951/02/19 DOA: 06/21/2022  PCP: [name], MD  Admit date: 06/21/2022 Discharge date: 06/22/2022  Time spent: 46 minutes  Recommendations for Outpatient Follow-up:  Please get be met in 1 to 2 days likely at oncology office Requires Chem-12 TSH in 3 weeks Please consider referral to the new DVT clinic that vascular will be opening in several weeks-patient will need to use a higher dose of apixaban on discharge for 7 days and then revert back to A-fib dosing after 7 days or - Stockings called in for patient CC Dr. Ashok Pall of neurosurgery with regards to rehospitalization as an FYI Follow-up with Dr. 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 Lorenso Courier office 06/15/2022 and was changed to lenalidomide dexamethasone daratumumab secondary to disease progression-supposed to start his dose on 2/21 in the cancer center  Recent pathological T2 fracture and T1 vertebral body + C7 + T3 status postrepair Dr. Berenda Morale. Christella Noa 06/04/2022  The  patient was started postoperatively on some Lasix and has been elevating his leg and the left lower extremity he also had some numbness in his left foot and apparently had a fall early in the morning on day of admission 2/19-sent to hospital because he had a bad fall did not hit his head He was found to be in A-fib on arrival to the hospital Potassium was 3.0 hemoglobin was consistent with prior  Patient was evaluated the next day noted to have good resolution of his A-fib PR His potassium was low and was hypokalemic and this was replaced He had some swelling and discomfort in his left lower extremity where the DVT was in a palpable cord, we ordered compression stockings for discharge I discussed the case with Dr. Lorenso Courier over secure chat and he states that patient can follow-up 2/21 for initiation of the new chemo  Patient ambulated well with a walker in the hallway without any significant tachycardia His echocardiogram did not show any change in EF, with no wall motion abnormality EF 60-65% no dyssynchrony and it was a technically challenging study given his habitus  Patient met all criteria for discharge from a stability perspective-I requested cardiology set up a follow-up appointment with his primary cardiologist [name] and this was placed on the AVS and I will CC her in addition   Discharge Exam: Vitals:   06/22/22 0758 06/22/22 0927  BP: (!) 145/67 (!) 148/64  Pulse: 66   Resp: 18   Temp: 98.7 F (37.1 C)   SpO2: 97%     Subj on day of d/c   Awake coherent seen while walking in the hallway earlier this morning no  chest pain seems comfortable he has a stooped gait No icterus no pallor On exam in the room his chest is clear no rales no rhonchi no wheeze Neck is soft and supple No icterus no pallor Mallampati 4 S1-S2 seems to be in sinus rhythm do not appreciate any PVCs on auscultation on telemetry he is in sinus rhythm Abdomen is obese nontender no rebound no guarding Left  lower extremity has a palpable cord behind the calf which is slightly tender Neurologically is intact his mentation is intact he is euthymic and coherent  Discharge Instructions   Discharge Instructions     Call MD for:  difficulty breathing, headache or visual disturbances   Complete by: As directed    Call MD for:  extreme fatigue   Complete by: As directed    Diet - low sodium heart healthy   Complete by: As directed    Discharge instructions   Complete by: As directed    This hospitalization you were diagnosed with a blood clot in your left lower extremity and it is integral that you use a higher dose of your blood thinner for the next 7 days and then go back to the regular dose that you are on previously I would recommend that you get TED hose and put them on your left leg to prevent post thrombotic syndrome  You also would diagnosed with A-fib RVR and required a medication called Cardizem on discharge to control your heart rate in addition to your other meds If you feel dizzy or lightheaded with this medication, you need to call your doctor/cardiologist to ensure that they are aware of the same  You will require a chemistry panel in 1 to 2 days because of your slightly low potassium this hospital stay Also follow-up with cardiology in the outpatient setting for thyroid and liver functions tests in about 3 weeks   Best of luck Go slowly and if you are able to get a referral to the DVT clinic that we will be opening in several weeks with vascular surgery Dr. Lorenso Courier can coordinate this   Increase activity slowly   Complete by: As directed       Allergies as of 06/22/2022       Reactions   Linezolid Other (See Comments)   Burning tongue    Vancomycin Rash        Medication List     STOP taking these medications    amLODipine 10 MG tablet Commonly known as: NORVASC   pomalidomide 4 MG capsule Commonly known as: POMALYST       TAKE these medications     acetaminophen 325 MG tablet Commonly known as: Tylenol Take 2 tablets (650 mg total) by mouth every 6 (six) hours as needed.   acyclovir 400 MG tablet Commonly known as: ZOVIRAX Take 1 tablet (400 mg total) by mouth 2 (two) times daily.   amiodarone 200 MG tablet Commonly known as: PACERONE Take 1 tablet (200 mg total) by mouth daily.   apixaban 5 MG Tabs tablet Commonly known as: ELIQUIS Take 2 tablets (10 mg total) by mouth 2 (two) times daily for 7 days. What changed: how much to take   apixaban 5 MG Tabs tablet Commonly known as: ELIQUIS Take 1 tablet (5 mg total) by mouth 2 (two) times daily. Start taking on: June 29, 2022 What changed: You were already taking a medication with the same name, and this prescription was added. Make sure you understand how and when  to take each.   cholecalciferol 25 MCG (1000 UNIT) tablet Commonly known as: VITAMIN D3 Take 1,000 Units by mouth daily.   clobetasol cream 0.05 % Commonly known as: TEMOVATE Apply 1 Application topically daily as needed (rash).   diltiazem 240 MG 24 hr capsule Commonly known as: CARDIZEM CD Take 1 capsule (240 mg total) by mouth daily. Start taking on: June 23, 2022   FISH OIL PO Take 1 Capful by mouth daily.   fluticasone 50 MCG/ACT nasal spray Commonly known as: FLONASE Place 1 spray into both nostrils daily as needed for allergies or rhinitis.   folic acid 1 MG tablet Commonly known as: FOLVITE Take 1 mg by mouth daily.   furosemide 40 MG tablet Commonly known as: LASIX Take 40 mg by mouth daily as needed for edema.   gabapentin 300 MG capsule Commonly known as: NEURONTIN TAKE TWO CAPSULES BY MOUTH TWICE A DAY   hydrALAZINE 25 MG tablet Commonly known as: APRESOLINE Take 1.5 tablets (37.5 mg total) by mouth 3 (three) times daily.   HYDROcodone-acetaminophen 7.5-325 MG tablet Commonly known as: NORCO Take 1 tablet by mouth every 6 (six) hours as needed for moderate pain.    lisinopril 40 MG tablet Commonly known as: ZESTRIL TAKE ONE TABLET BY MOUTH ONE TIME DAILY   methocarbamol 750 MG tablet Commonly known as: ROBAXIN Take 750 mg by mouth 3 (three) times daily.   sodium chloride 0.65 % Soln nasal spray Commonly known as: OCEAN Place 1 spray into both nostrils as needed for congestion.   traMADol 50 MG tablet Commonly known as: ULTRAM Take 1 tablet (50 mg total) by mouth every 6 (six) hours as needed. What changed: reasons to take this   triamcinolone cream 0.1 % Commonly known as: KENALOG Apply 1 application topically daily as needed (itching).   VITAMIN B-12 PO Take 1,000 mcg by mouth daily. Not sure the dosage       Allergies  Allergen Reactions   Linezolid Other (See Comments)    Burning tongue    Vancomycin Rash    Follow-up Information     Marylu Lund., NP Follow up on 07/02/2022.   Specialty: Cardiology Why: 1:55PM. Follow up with [name]' nurse practitioner Contact information: [address] [place] [address] [phone]                  The results of significant diagnostics from this hospitalization (including imaging, microbiology, ancillary and laboratory) are listed below for reference.    Significant Diagnostic Studies: ECHOCARDIOGRAM COMPLETE  Result Date: 06/22/2022    ECHOCARDIOGRAM REPORT   Patient Name:   WAYBURN CLASSON [facility] Date of Exam: 06/22/2022 Medical Rec #:  [id number]        Height:       70.0 in Accession #:    [id number]       Weight:       244.3 lb Date of Birth:  1951-02-11       BSA:          2.272 m Patient Age:    25 years         BP:           132/74 mmHg Patient Gender: M                HR:           65 bpm. Exam Location:  Inpatient Procedure: 2D Echo, Color Doppler, Cardiac Doppler and Intracardiac  Opacification Agent Indications:    I48.91* Unspeicified atrial fibrillation  History:        Patient has prior history of Echocardiogram examinations, most                  recent 09/01/2021. Abnormal ECG, Arrythmias:Atrial Fibrillation,                 Signs/Symptoms:Chest Pain and Edema; Risk Factors:Hypertension                 and Sleep Apnea. Chemotherapy. Myeloma.  Sonographer:    Roseanna Rainbow RDCS Referring Phys: [id number] MIR M [facility]  Sonographer Comments: Technically difficult study due to poor echo windows and patient is obese. Image acquisition challenging due to patient body habitus. Patient in fowler's position. Patient post spinal surgery 2/2. IMPRESSIONS  1. Appears to be in sinus rhythm at the time of evaluation- A waves present on diastology. Left ventricular ejection fraction, by estimation, is 60 to 65%. The left ventricle has normal function. The left ventricle has no regional wall motion abnormalities. There is mild concentric left ventricular hypertrophy. Left ventricular diastolic parameters are consistent with Grade II diastolic dysfunction (pseudonormalization).  2. Right ventricular systolic function is normal. The right ventricular size is normal. There is normal pulmonary artery systolic pressure.  3. The mitral valve is grossly normal. No evidence of mitral valve regurgitation.  4. The aortic valve is tricuspid. Aortic valve regurgitation is not visualized. No aortic stenosis is present.  5. The inferior vena cava is normal in size with greater than 50% respiratory variability, suggesting right atrial pressure of 3 mmHg. FINDINGS  Left Ventricle: Appears to be in sinus rhythm at the time of evaluation- A waves present on diastology. Left ventricular ejection fraction, by estimation, is 60 to 65%. The left ventricle has normal function. The left ventricle has no regional wall motion abnormalities. Definity contrast agent was given IV to delineate the left ventricular endocardial borders. The left ventricular internal cavity size was normal in size. There is mild concentric left ventricular hypertrophy. Left ventricular diastolic parameters are  consistent with Grade II diastolic dysfunction (pseudonormalization). Right Ventricle: The right ventricular size is normal. No increase in right ventricular wall thickness. Right ventricular systolic function is normal. There is normal pulmonary artery systolic pressure. The tricuspid regurgitant velocity is 2.33 m/s, and  with an assumed right atrial pressure of 3 mmHg, the estimated right ventricular systolic pressure is [id number] mmHg. Left Atrium: Left atrial size was normal in size. Right Atrium: Right atrial size was normal in size. Pericardium: There is no evidence of pericardial effusion. Mitral Valve: The mitral valve is grossly normal. No evidence of mitral valve regurgitation. Tricuspid Valve: The tricuspid valve is normal in structure. Tricuspid valve regurgitation is not demonstrated. No evidence of tricuspid stenosis. Aortic Valve: The aortic valve is tricuspid. Aortic valve regurgitation is not visualized. No aortic stenosis is present. Pulmonic Valve: The pulmonic valve was grossly normal. Pulmonic valve regurgitation is not visualized. Aorta: The aortic root and ascending aorta are structurally normal, with no evidence of dilitation. Venous: The inferior vena cava is normal in size with greater than 50% respiratory variability, suggesting right atrial pressure of 3 mmHg. IAS/Shunts: No atrial level shunt detected by color flow Doppler.  LEFT VENTRICLE PLAX 2D LVIDd:         5.20 cm   Diastology LVIDs:         3.30 cm   LV e' medial:    6.53 cm/s  LV PW:         1.20 cm   LV E/e' medial:  15.5 LV IVS:        1.20 cm   LV e' lateral:   11.60 cm/s LVOT diam:     2.60 cm   LV E/e' lateral: 8.7 LV SV:         133 LV SV Index:   58 LVOT Area:     5.31 cm  RIGHT VENTRICLE             IVC RV S prime:     18.80 cm/s  IVC diam: 2.60 cm TAPSE (M-mode): 3.0 cm LEFT ATRIUM             Index        RIGHT ATRIUM           Index LA diam:        4.30 cm 1.89 cm/m   RA Area:     20.90 cm LA Vol (A2C):   84.9 ml 37.36  ml/m  RA Volume:   60.10 ml  26.45 ml/m LA Vol (A4C):   66.4 ml 29.22 ml/m LA Biplane Vol: 74.9 ml 32.96 ml/m  AORTIC VALVE LVOT Vmax:   127.00 cm/s LVOT Vmean:  84.000 cm/s LVOT VTI:    0.250 m  AORTA Ao Root diam: 3.70 cm Ao Asc diam:  3.70 cm MITRAL VALVE                TRICUSPID VALVE MV Area (PHT): 4.21 cm     TR Peak grad:   21.7 mmHg MV Decel Time: 180 msec     TR Vmax:        233.00 cm/s MV E velocity: 101.00 cm/s MV A velocity: 54.00 cm/s   SHUNTS MV E/A ratio:  1.87         Systemic VTI:  0.25 m                             Systemic Diam: 2.60 cm Rudean Haskell MD Electronically signed by Rudean Haskell MD Signature Date/Time: 06/22/2022/9:37:19 AM    Final    DG Thoracic Spine 2 View  Result Date: 06/21/2022 CLINICAL DATA:  Trauma EXAM: THORACIC SPINE 2 VIEWS COMPARISON:  Cone beam CT 06/04/22 FINDINGS: Assessment is limited due to patient positioning. Post surgical changes from fusion at the cervicothoracic junction. Hardware is intact with unchanged alignment compared to immediate post-operative cone-beam CT. There is no evidence of thoracic spine fracture. No other significant bone abnormalities are identified. IMPRESSION: Limited assessment due to patient positioning. Within this limitation - no evidence of thoracic spine fracture. If there is high clinical concern fro a cervical or thoracic spine injury, further evaluation with CT is recommended. Electronically Signed   By: Marin Roberts M.D.   On: 06/21/2022 10:55   VAS [place] LOWER EXTREMITY VENOUS (DVT) (7a-7p)  Result Date: 06/21/2022  Lower Venous DVT Study Patient Name:  HASAN CUSTARD [facility]  Date of Exam:   06/21/2022 Medical Rec #: [id number]         Accession #:    [id number] Date of Birth: 11/13/1950        Patient Gender: M Patient Age:   18 years Exam Location:  [facility] Procedure:      VAS [place] LOWER EXTREMITY VENOUS (DVT) Referring Phys: Wille Glaser KNAPP  --------------------------------------------------------------------------------  Indications: Asymmetrical swelling x2 weeks LT>RT, s/p thoracic spine surgery  06/04/2022.  Risk Factors: History of DVT. Anticoagulation: Eliquis. Limitations: Poor ultrasound/tissue interface. Comparison Study: No prior studies available. Performing Technologist: Darlin Coco RDMS, RVT  Examination Guidelines: A complete evaluation includes B-mode imaging, spectral Doppler, color Doppler, and power Doppler as needed of all accessible portions of each vessel. Bilateral testing is considered an integral part of a complete examination. Limited examinations for reoccurring indications may be performed as noted. The reflux portion of the exam is performed with the patient in reverse Trendelenburg.  +-----+---------------+---------+-----------+----------+--------------+ RIGHTCompressibilityPhasicitySpontaneityPropertiesThrombus Aging +-----+---------------+---------+-----------+----------+--------------+ CFV  Full           Yes      Yes                                 +-----+---------------+---------+-----------+----------+--------------+   +---------+---------------+---------+-----------+----------+--------------+ LEFT     CompressibilityPhasicitySpontaneityPropertiesThrombus Aging +---------+---------------+---------+-----------+----------+--------------+ CFV      Full           Yes      Yes                                 +---------+---------------+---------+-----------+----------+--------------+ SFJ      Full                                                        +---------+---------------+---------+-----------+----------+--------------+ FV Prox  Full                                                        +---------+---------------+---------+-----------+----------+--------------+ FV Mid   Full                                                         +---------+---------------+---------+-----------+----------+--------------+ FV DistalFull                                                        +---------+---------------+---------+-----------+----------+--------------+ PFV      Full                                                        +---------+---------------+---------+-----------+----------+--------------+ POP      Full           Yes      Yes                                 +---------+---------------+---------+-----------+----------+--------------+ PTV      Full                                                        +---------+---------------+---------+-----------+----------+--------------+  PERO     Partial        No       No                   Acute          +---------+---------------+---------+-----------+----------+--------------+     Summary: RIGHT: - No evidence of common femoral vein obstruction.  LEFT: - Findings consistent with acute deep vein thrombosis involving the left peroneal veins. - No cystic structure found in the popliteal fossa.  *See table(s) above for measurements and observations. Electronically signed by Servando Snare MD on 06/21/2022 at 10:46:28 AM.    Final    DG Chest 2 View  Result Date: 06/21/2022 CLINICAL DATA:  Weakness EXAM: CHEST - 2 VIEW COMPARISON:  CXR 03/10/22 FINDINGS: Assessment of the bilateral lung apices limited due to patient positioning/chin down technique. No pleural effusion. No pneumothorax. No focal airspace opacity. Unchanged cardiac and mediastinal contours. Visualized upper abdomen is unremarkable. Possible age indeterminate fracture of the anterior/lateral left sixth rib. Visualized upper abdomen is unremarkable. IMPRESSION: 1. Possible age indeterminate fracture of the anterolateral left sixth rib. Correlate with point tenderness. 2. No focal airspace opacity. Electronically Signed   By: Marin Roberts M.D.   On: 06/21/2022 08:38   DG Cervical Spine 2 or 3 views  Result  Date: 06/04/2022 CLINICAL DATA:  Elective surgery EXAM: CERVICAL SPINE - 2-3 VIEW COMPARISON:  MRI cervical spine 05/27/2022. Cervical spine x-ray 05/13/2022 FINDINGS: Two views of the cervical spine were submitted, intraoperative. No hardware identified. Fluoroscopy time 15 seconds. Dose: 1.76 micro Gy. IMPRESSION: Intraoperative cervical spine as above. Electronically Signed   By: Ronney Asters M.D.   On: 06/04/2022 15:25   DG C-Arm 1-60 Min-No Report  Result Date: 06/04/2022 Fluoroscopy was utilized by the requesting physician.  No radiographic interpretation.   DG O-ARM IMAGE ONLY/NO REPORT  Result Date: 06/04/2022 There is no Radiologist interpretation  for this exam.  CT THORACIC SPINE W CONTRAST  Result Date: 06/04/2022 CLINICAL DATA:  Metastatic disease.  Preoperative assessment EXAM: CT THORACIC SPINE WITH CONTRAST TECHNIQUE: Multidetector CT images of thoracic was performed according to the standard protocol following intravenous contrast administration. RADIATION DOSE REDUCTION: This exam was performed according to the departmental dose-optimization program which includes automated exposure control, adjustment of the mA and/or kV according to patient size and/or use of iterative reconstruction technique. CONTRAST:  1m OMNIPAQUE IOHEXOL 350 MG/ML SOLN COMPARISON:  05/29/2022 FINDINGS: Alignment: Exaggerated thoracic kyphosis. Vertebrae: Suboptimal coverage especially considering level of primary concern. Partially covered T2 vertebra with known tumoral erosion of the vertebral body with minimal remaining inferior endplate visible. Compression fracture of this vertebra with advanced narrowing. Diffuse heterogeneity of marrow with tumor extent better assessed on prior MRI, most notable deposits in the T8 body, T11 spinous process, and L1 left pedicle. Additional rib lesions on the right at T6 and T8 primarily. Paraspinal and other soft tissues: Paravertebral tumor eccentric to the left at T2. Disc  levels: Ordinary thoracic spondylosis. Request was made for additional imaging but declined due to pending surgery IMPRESSION: 1. Suboptimal exam with only partial coverage of the T2 level where there is extensive tumor replacement of the vertebral body with collapse and paravertebral/spinal canal mass. 2. Additional lesions also seen on recent MRI. Electronically Signed   By: JJorje GuildM.D.   On: 06/04/2022 07:44   MR LUMBAR SPINE W WO CONTRAST  Result Date: 05/29/2022 CLINICAL DATA:  72year old male with multiple myeloma.  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 EXAM: MRI LUMBAR SPINE WITHOUT AND WITH CONTRAST TECHNIQUE: Multiplanar and multiecho pulse sequences of the lumbar spine were obtained without and with intravenous contrast. CONTRAST:  22m GADAVIST GADOBUTROL 1 MMOL/ML IV SOLN thoracic in conjunction with contrast enhanced imaging of the chest, abdomen, and pelvis reported separately. COMPARISON:  Thoracic MRI today reported separately. Prior lumbar MRI 06/25/2019. FINDINGS: Segmentation: Normal, concordant with the thoracic numbering today and the same designation used in 2021. Alignment:  Stable lumbar vertebral height and alignment since 2021. Vertebrae: Confluent up to 2.3 cm tumor in the left L1 posterior elements including the pedicle (series 11, image 7). Small 8 mm tumor in the inferior L2 body. And larger 2 cm tumor in the left L2 pedicle. Subcentimeter tumor inferior L3 vertebral body. Larger 10 mm tumor posterior L4 vertebral body. L5 small 9 mm and smaller tumor in the right L5 body. Scattered subcentimeter left sacral ala tumor. These lesions are new since 2021. No lumbar or visible sacral pathologic fracture. No extraosseous or epidural tumor identified. Conus medullaris and cauda equina: Conus extends to the L1 level. No lower spinal cord or conus signal abnormality. No abnormal intradural enhancement.  No dural thickening identified. Paraspinal and other soft tissues: Negative for age visible abdominal viscera. Negative lumbar paraspinal soft tissues. Disc levels: Lumbar spine degeneration not significantly changed since 2021 Aside from new moderate size central disc protrusion at L3-L4 (series 11, image 22) now resulting in mild to moderate spinal stenosis at that level. IMPRESSION: 1. Generally small Multiple Myeloma lesions scattered at all lumbar levels and in the visible left sacral ala. Largest lumbar tumor is 2.3 cm in the left L1 posterior elements and pedicle. No lumbar extraosseous or epidural tumor extension, or pathologic fracture. 2. 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 Electronically Signed   By: HGenevie AnnM.D.   On: 05/29/2022 12:22   MR THORACIC SPINE W WO CONTRAST  Result Date: 05/29/2022 CLINICAL DATA:  72year old male with multiple myeloma. 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 EXAM: MRI THORACIC WITHOUT AND WITH CONTRAST TECHNIQUE: Multiplanar and multiecho pulse sequences of the thoracic spine were obtained without and with intravenous contrast. CONTRAST:  186mGADAVIST GADOBUTROL 1 MMOL/ML IV SOLN COMPARISON:  Cervical spine MRI without and with contrast 05/27/2022. FINDINGS: Limited cervical spine imaging: Stable compared to the recent 05/27/2022 exam. Thoracic spine segmentation:  Appears to be normal. Alignment: Exaggerated upper thoracic kyphosis and part related to the T2 pathologic lesion. Vertebrae: T1 better demonstrated on the recent cervical comparison. Highly abnormal T2 level with retropulsed and epidural tumor. Subsequent cord compression which does not appear significantly changed since 05/27/2022 (series 22, image 9, series 23, images 7 and 8). No definite cord  edema or myelomalacia. Bulky ventral and left lateral extraosseous tumor extension as previously noted. Tumor in the T2 lamina and spinous process also. See series 26, image 9). Mild right T3 posteroinferior vertebral body tumor tracking toward the pedicle. No pathologic fracture. No tumor identified at T4, T5. However, there is posterior right 5th rib tumor on series 26, image 26. Similar small 5 mm enhancing tumor in the left T6 inferior body, endplate. No tumor identified at T7. However, there is posterior right 7th rib tumor (series 27,  image 5). T8 12 mm tumor in the anterior body on the right side. No pathologic fracture. No T9 tumor. T10 inferior endplate 12 mm tumor with subtle associated inferior endplate compression. T11 bulky 18 mm midline lamina and spinous process tumor, but no extraosseous extension. No T12 tumor. Lumbar spine detailed separately. Cord: Normal spinal canal patency and thoracic cord below the T2-T3 disc level. Cord signal and morphology remains normal to the conus at L1. No abnormal intradural enhancement. No definite dural thickening. Paraspinal and other soft tissues: T2 paraspinal tumor eccentric to the left as above. Small layering pleural effusions. Negative visible other chest and upper abdominal viscera. Disc levels: Mild for age underlying thoracic spine degeneration. No degenerative spinal stenosis. IMPRESSION: 1. 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 No change compared to 05/27/2022. No cord edema or myelomalacia identified. 2. Small tumor foci at most other thoracic levels, with no other thoracic extraosseous or epidural tumor extension. Posterior rib involvement intermittently noted. 3. Small layering pleural effusions. 4. Lumbar MRI today is reported separately. Electronically Signed   By: Genevie Ann M.D.   On: 05/29/2022 12:11   MR CERVICAL SPINE W WO  CONTRAST  Addendum Date: 05/28/2022   ADDENDUM REPORT: 05/28/2022 16:46 ADDENDUM: Critical Value/emergent results were called by telephone at the time of interpretation on 05/28/2022 at 4:40 pm to provider on-call Heath Lark, who verbally acknowledged these results. Electronically Signed   By: Richardean Sale M.D.   On: 05/28/2022 16:46   Result Date: 05/28/2022 CLINICAL DATA:  Multiple myeloma. Increasing neck pain/tenderness with limited range of motion. EXAM: MRI CERVICAL SPINE WITHOUT AND WITH CONTRAST TECHNIQUE: Multiplanar and multiecho pulse sequences of the cervical spine, to include the craniocervical junction and cervicothoracic junction, were obtained without and with intravenous contrast. CONTRAST:  23m GADAVIST GADOBUTROL 1 MMOL/ML IV SOLN COMPARISON:  Radiographs of the cervical and thoracic spine 05/13/2022. Chest CTA 03/15/2022. Cervical MRI 03/06/2020. FINDINGS: Alignment: The alignment of the cervical spine is anatomic, although progressive kyphosis has developed at T2 secondary to a pathologic fracture. Vertebrae: As demonstrated on previous chest CTA, there are lytic lesions in the thoracic spine with associated enhancement following contrast. A large lesion at T2 has progressed and is associated with a new marked pathologic fracture with osseous retropulsion by 6 mm. Resulting cord compression. There is near complete replacement of the T2 vertebral body by tumor, measuring up to 4.8 x 4.0 cm transverse on axial image 47/12. There are additional smaller myelomatous lesions within the T1 vertebral body, within the right C7 and right T3 articulating facets. No other pathologic fractures are identified. Cord: The cervical cord is normal in signal and caliber. There is posterior displacement and mild compression of the thoracic cord at T2 by the pathologic fracture described above. Posterior Fossa, vertebral arteries, paraspinal tissues: Linear enhancement within the right cerebellar hemisphere  is likely due to an incidental venous angioma. No associated significant linear T2 hyperintensity. The visualized posterior fossa is otherwise unremarkable.Bilateral vertebral artery flow voids. No significant paraspinal findings. Disc levels: C2-3: Bilateral facet hypertrophy contributes to chronic left foraminal narrowing. No cord deformity. C3-4: The disc appears normal. The left facet joint appears ankylosed. Chronic moderate left foraminal narrowing. C4-5: Chronic disc bulging with asymmetric uncinate spurring on the right and bilateral facet hypertrophy. Stable mild spinal stenosis without cord deformity. Moderate right-greater-than-left foraminal narrowing is similar to the previous study. C5-6: Chronic spondylosis with posterior  osteophytes covering diffusely bulging disc material. Mild facet hypertrophy. Stable mild spinal stenosis and mild foraminal narrowing bilaterally. C6-7: Chronic spondylosis with posterior osteophytes covering diffusely bulging disc material. Mild bilateral facet hypertrophy. Mild foraminal narrowing bilaterally. C7-T1: Normal interspace. As above, pathologic fracture at T2 with significant osseous retropulsion and resulting cord compression. There is moderate left greater than right foraminal narrowing at the T1-2 and T2-3 disc space levels. IMPRESSION: 1. Interval progression of multiple myeloma in the thoracic spine compared with previous chest CT of 10 weeks ago. There is a new pathologic fracture at T2 with significant osseous retropulsion and resulting cord compression. No abnormal cord signal identified. 2. Additional smaller lesions within the T1 vertebral body and right C7 and T3 articulating facets. No other pathologic fractures identified. 3. Mild multilevel cervical spondylosis with resulting mild spinal stenosis and mild to moderate foraminal narrowing at multiple levels as described, similar to previous MRI. No cord compression in the cervical spine. Electronically  Signed: By: Richardean Sale M.D. On: 05/28/2022 16:11    Microbiology: No results found for this or any previous visit (from the past 240 hour(s)).   Labs: Basic Metabolic Panel: Recent Labs  Lab 06/21/22 0800 06/22/22 0544  NA 135 139  K 3.0* 3.1*  CL 104 106  CO2 26 25  GLUCOSE 103* 102*  BUN 16 15  CREATININE 0.75 0.90  CALCIUM 7.6* 7.8*  MG 1.9  --    Liver Function Tests: Recent Labs  Lab 06/22/22 0544  AST 12*  ALT 19  ALKPHOS 74  BILITOT 0.4  PROT 4.6*  ALBUMIN 2.5*   No results for input(s): "LIPASE", "AMYLASE" in the last 168 hours. No results for input(s): "AMMONIA" in the last 168 hours. CBC: Recent Labs  Lab 06/21/22 0800 06/22/22 0544  WBC 7.0 6.6  HGB 10.3* 9.0*  HCT 31.6* 28.0*  MCV 100.6* 101.1*  PLT 165 139*   Cardiac Enzymes: No results for input(s): "CKTOTAL", "CKMB", "CKMBINDEX", "TROPONINI" in the last 168 hours. BNP: BNP (last 3 results) No results for input(s): "BNP" in the last 8760 hours.  ProBNP (last 3 results) Recent Labs    11/17/21 1513  PROBNP 232    CBG: No results for input(s): "GLUCAP" in the last 168 hours.     Signed:  Nita Sells MD   [facility] 06/22/2022, 11:35 AM

## 2022-06-22 NOTE — Telephone Encounter (Signed)
ED note from 2/0/19/24:   Patient presented to the ED for evaluation of weakness and fall in the setting of recent spine surgery.  Patient has known history of multiple myeloma with lesions involving his spine.  Patient also noticed leg swelling.  He denies any chest pain or shortness of breath.  ED workup notable for an acute DVT.  Patient is on anticoagulation but he did stop it in preparation of his surgery.  I suspect this is the likely etiology and not necessarily a failed response to his anticoagulation medication.  Patient noted to be in rapid A-fib.  Fortunately is not in any distress.  We will continue Cardizem infusion for rate control.  No signs of any acute fractures associated with his fall.  Hypokalemia noted on labs and IV and oral potassium has been ordered.  I have spoken with Dr. Renaee Munda regarding admission for further treatment    Will forward to Dr Harrington Challenger for review.

## 2022-06-23 ENCOUNTER — Inpatient Hospital Stay: Payer: Medicare Other

## 2022-06-23 ENCOUNTER — Inpatient Hospital Stay (HOSPITAL_BASED_OUTPATIENT_CLINIC_OR_DEPARTMENT_OTHER): Payer: Medicare Other | Admitting: Physician Assistant

## 2022-06-23 VITALS — BP 142/60 | HR 59 | Resp 17

## 2022-06-23 DIAGNOSIS — E876 Hypokalemia: Secondary | ICD-10-CM | POA: Diagnosis not present

## 2022-06-23 DIAGNOSIS — I480 Paroxysmal atrial fibrillation: Secondary | ICD-10-CM | POA: Diagnosis not present

## 2022-06-23 DIAGNOSIS — M5126 Other intervertebral disc displacement, lumbar region: Secondary | ICD-10-CM | POA: Diagnosis not present

## 2022-06-23 DIAGNOSIS — C9000 Multiple myeloma not having achieved remission: Secondary | ICD-10-CM

## 2022-06-23 DIAGNOSIS — R21 Rash and other nonspecific skin eruption: Secondary | ICD-10-CM | POA: Diagnosis not present

## 2022-06-23 DIAGNOSIS — Z5112 Encounter for antineoplastic immunotherapy: Secondary | ICD-10-CM | POA: Diagnosis not present

## 2022-06-23 DIAGNOSIS — Z881 Allergy status to other antibiotic agents status: Secondary | ICD-10-CM | POA: Diagnosis not present

## 2022-06-23 DIAGNOSIS — Z818 Family history of other mental and behavioral disorders: Secondary | ICD-10-CM | POA: Diagnosis not present

## 2022-06-23 DIAGNOSIS — Z79899 Other long term (current) drug therapy: Secondary | ICD-10-CM | POA: Diagnosis not present

## 2022-06-23 DIAGNOSIS — Z86718 Personal history of other venous thrombosis and embolism: Secondary | ICD-10-CM | POA: Diagnosis not present

## 2022-06-23 DIAGNOSIS — M546 Pain in thoracic spine: Secondary | ICD-10-CM | POA: Diagnosis not present

## 2022-06-23 DIAGNOSIS — Z823 Family history of stroke: Secondary | ICD-10-CM | POA: Diagnosis not present

## 2022-06-23 DIAGNOSIS — Z8249 Family history of ischemic heart disease and other diseases of the circulatory system: Secondary | ICD-10-CM | POA: Diagnosis not present

## 2022-06-23 DIAGNOSIS — Z7901 Long term (current) use of anticoagulants: Secondary | ICD-10-CM | POA: Diagnosis not present

## 2022-06-23 LAB — CBC WITH DIFFERENTIAL (CANCER CENTER ONLY)
Abs Immature Granulocytes: 0.02 10*3/uL (ref 0.00–0.07)
Basophils Absolute: 0 10*3/uL (ref 0.0–0.1)
Basophils Relative: 0 %
Eosinophils Absolute: 0 10*3/uL (ref 0.0–0.5)
Eosinophils Relative: 1 %
HCT: 29.6 % — ABNORMAL LOW (ref 39.0–52.0)
Hemoglobin: 9.9 g/dL — ABNORMAL LOW (ref 13.0–17.0)
Immature Granulocytes: 0 %
Lymphocytes Relative: 2 %
Lymphs Abs: 0.1 10*3/uL — ABNORMAL LOW (ref 0.7–4.0)
MCH: 33 pg (ref 26.0–34.0)
MCHC: 33.4 g/dL (ref 30.0–36.0)
MCV: 98.7 fL (ref 80.0–100.0)
Monocytes Absolute: 0.3 10*3/uL (ref 0.1–1.0)
Monocytes Relative: 3 %
Neutro Abs: 7.6 10*3/uL (ref 1.7–7.7)
Neutrophils Relative %: 94 %
Platelet Count: 178 10*3/uL (ref 150–400)
RBC: 3 MIL/uL — ABNORMAL LOW (ref 4.22–5.81)
RDW: 16.8 % — ABNORMAL HIGH (ref 11.5–15.5)
WBC Count: 8.1 10*3/uL (ref 4.0–10.5)
nRBC: 0 % (ref 0.0–0.2)

## 2022-06-23 LAB — CMP (CANCER CENTER ONLY)
ALT: 19 U/L (ref 0–44)
AST: 13 U/L — ABNORMAL LOW (ref 15–41)
Albumin: 3.3 g/dL — ABNORMAL LOW (ref 3.5–5.0)
Alkaline Phosphatase: 112 U/L (ref 38–126)
Anion gap: 10 (ref 5–15)
BUN: 15 mg/dL (ref 8–23)
CO2: 23 mmol/L (ref 22–32)
Calcium: 7.8 mg/dL — ABNORMAL LOW (ref 8.9–10.3)
Chloride: 104 mmol/L (ref 98–111)
Creatinine: 0.77 mg/dL (ref 0.61–1.24)
GFR, Estimated: >60 mL/min (ref >60)
Glucose, Bld: 151 mg/dL — ABNORMAL HIGH (ref 70–99)
Potassium: 3.7 mmol/L (ref 3.5–5.1)
Sodium: 137 mmol/L (ref 135–145)
Total Bilirubin: 0.4 mg/dL (ref 0.3–1.2)
Total Protein: 5.7 g/dL — ABNORMAL LOW (ref 6.5–8.1)

## 2022-06-23 MED ORDER — BORTEZOMIB CHEMO SQ INJECTION 3.5 MG (2.5MG/ML)
1.3000 mg/m2 | Freq: Once | INTRAMUSCULAR | Status: AC
  Administered 2022-06-23: 3 mg via SUBCUTANEOUS
  Filled 2022-06-23: qty 1.2

## 2022-06-23 NOTE — Progress Notes (Signed)
[place] [phone]   [phone]  PROGRESS NOTE  Patient Care Team: [name], MD as PCP - General (Internal Medicine) Fay Records, MD as PCP - Cardiology (Cardiology) Sueanne Margarita, MD as PCP - Sleep Medicine (Cardiology) Vickie Epley, MD as PCP - Electrophysiology (Cardiology) Michael Boston, MD as Consulting Physician (General Surgery) Clarene Essex, MD as Consulting Physician (Gastroenterology) Sueanne Margarita, MD as Consulting Physician (Sleep Medicine) Alda Berthold, DO as Consulting Physician (Neurology)  Hematological/Oncological History # IgA Kappa Multiple Myeloma 05/05/2020: Bone marrow biopsy showed increased number of plasma cells representing 14% of all cells in the aspirate associated with numerous variably sized clusters in the clot and biopsy sections 04/10/2021: M protein 0.3, IgA kappa specificity. Kappa 580.5, Labda 7.0, ratio 82.93.   12/07/2021: Labs show of 1971.3, lambda 5.5, ratio 358.42 12/10/2021: Transition care to Dr. Lorenso Courier. 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  01/26/2022: Cycle 2 Day 1 of Dara/Rev/Dex 02/22/2022: Cycle 3 Day 1 of Dara/Rev/Dex 03/23/2022: Cycle 4 Day 1 of Dara/Rev/Dex 04/20/2022: Cycle 5 Day 1 of Dara/Rev/Dex 05/18/2022: Cycle 6 Day 1 of Dara/Rev/Dex 05/27/2022: MRI cervical/thoracic/lumbar: pathologic fracture at T2 with significant osseous retropulsion and resulting cord compression. Additional smaller lesions within T1 vertebral body, right C7 and T3 articulating facets.  Small Multiple Myeloma lesions scattered at all lumbar levels and in the visible left sacral ala. Largest lumbar tumor is 2.3 cm in the left L1 posterior elements and pedicle. No lumbar extraosseous or epidural tumor extension, or pathologic fracture.Underlying chronic lumbar spine  degeneration, with a new central disc herniation at L3-L4 since 2021 now resulting in mild to moderate degenerative spinal stenosis there. 05/28/2022-06/09/2022: Admitted for T2 pathologic fracture and cord compression. He received urgent radiation therapy for a total of 3.0 Gy. He underwent  thoracic 1 through 4 posterior lateral arthrodesis/fusion and 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  06/23/2022: Cycle 1 Day 1 of Velcade/Pomalyst/Dex  Interval History:  Travis [name] 72 y.o. male with medical history significant for IgA Kappa Multiple Myeloma who presents for a follow up visit. He was last seen on 05/18/2022. In the interim, he was hospitalized for MRI findings of pathologic fracture of T2 with cord compression. He underwent surgical resection with neurosurgery. He presents today to start a new therapy as recommended by Dr. Lorenso Courier. He is unaccompanied for this visit.   On exam today Travis [name] reports he is slowly recovering from his hospitalization. He still has pain in his upper back but it is moderately controlled with Norco at night and Tramadol during the day. He is hesistant to take Norco during the day.  He denies nausea, vomiting or abdominal pain. His bowel habits are unchanged. He denies easy bruising or signs of active bleeding. He denies any fevers, chills, sweats, shortness of breath, chest pain.  A full 10 point ROS is listed below.  Overall he is willing and able to proceed with treatment today.  MEDICAL HISTORY:  Past Medical History:  Diagnosis Date   Arthritis    "comes and goes; mostly in hands, occasionally elbow" (0000000)   Complication of anesthesia    "just wanted to sleep alot  for couple days S/P VARICOCELE OR" (04/05/2017)   DVT (deep venous thrombosis) (HCC)    LLE   Dysrhythmia    PVCs    GERD (gastroesophageal reflux  disease)    History of hiatal hernia    Hypertension    Impaired glucose tolerance    Multiple myeloma (HCC)     Obesity (BMI 30-39.9) 07/26/2016   OSA on CPAP 07/26/2016   Mild with AHI 12.5/hr now on CPAP at 14cm H2O   PAF (paroxysmal atrial fibrillation) ([place])    s/p PVI Ablation in 2018 // Flecainide Rx // Echo 8/21: EF 60-65, no RWMA, mild LVH, normal RV SF, moderate RVE, mild BAE, trivial MR, mild dilation of aortic root (40 mm)   Pneumonia    "may have had walking pneumonia a few years ago" (04/05/2017)   Pre-diabetes    Thrombophlebitis     SURGICAL HISTORY: Past Surgical History:  Procedure Laterality Date   ATRIAL FIBRILLATION ABLATION  04/05/2017   ATRIAL FIBRILLATION ABLATION N/A 04/05/2017   Procedure: ATRIAL FIBRILLATION ABLATION;  Surgeon: Thompson Grayer, MD;  Location: [facility];  Service: Cardiovascular;  Laterality: N/A;   BONE BIOPSY Right 12/17/2021   Procedure: BONE BIOPSY;  Surgeon: Hiram Gash, MD;  Location: [facility];  Service: Orthopedics;  Laterality: Right;   COMPLETE RIGHT HIP REPLACMENT  01/19/2019   EVALUATION UNDER ANESTHESIA WITH HEMORRHOIDECTOMY N/A 02/24/2017   Procedure: EXAM UNDER ANESTHESIA WITH  HEMORRHOIDECTOMY;  Surgeon: Michael Boston, MD;  Location: [facility];  Service: General;  Laterality: N/A;   EXCISIONAL TOTAL HIP ARTHROPLASTY WITH ANTIBIOTIC SPACERS Right 09/25/2020   Procedure: EXCISIONAL TOTAL HIP ARTHROPLASTY WITH ANTIBIOTIC SPACERS;  Surgeon: Paralee Cancel, MD;  Location: [facility];  Service: Orthopedics;  Laterality: Right;   FLEXIBLE SIGMOIDOSCOPY N/A 04/15/2015   Procedure:  UNSEDATED FLEXIBLE SIGMOIDOSCOPY;  Surgeon: [name], MD;  Location: [facility];  Service: Endoscopy;  Laterality: N/A;   HUMERUS IM NAIL Right 12/17/2021   Procedure: INTRAMEDULLARY (IM) NAIL HUMERAL;  Surgeon: Hiram Gash, MD;  Location: [facility];  Service: Orthopedics;  Laterality: Right;   KNEE ARTHROSCOPY Left ~ 2016   POSTERIOR CERVICAL FUSION/FORAMINOTOMY N/A 06/04/2022   Procedure: Thoracic one - Thoracic Four Posterior  lateral arthrodesis/ fusion and Thoracic two Corpectomy;  Surgeon: Ashok Pall, MD;  Location: [facility];  Service: Neurosurgery;  Laterality: N/A;   REIMPLANTATION OF TOTAL HIP Right 12/25/2020   Procedure: REIMPLANTATION/REVISION OF RIGHT TOTAL HIP VERSUS REPEAT IRRIGATION AND DEBRIDEMENT;  Surgeon: Paralee Cancel, MD;  Location: [facility];  Service: Orthopedics;  Laterality: Right;   TESTICLE SURGERY  1988   VARICOCELE   TONSILLECTOMY     VARICOSE VEIN SURGERY Bilateral     SOCIAL HISTORY: Social History   Socioeconomic History   Marital status: Married    Spouse name: Not on file   Number of children: 1   Years of education: law school   Highest education level: Not on file  Occupational History   Occupation: lawyer  Tobacco Use   Smoking status: Never   Smokeless tobacco: Never  Vaping Use   Vaping Use: Never used  Substance and Sexual Activity   Alcohol use: Yes    Alcohol/week: 2.0 standard drinks of alcohol    Types: 2 Glasses of wine per week    Comment: 2 glasses of wine a week, occ bourbon, beer   Drug use: No   Sexual activity: Not Currently  Other Topics Concern   Not on file  Social History Narrative   Lives in [place] with spouse in a 2 story home.  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    Social Determinants of Health   Financial Resource Strain: Not on file  Food Insecurity: No Food Insecurity (06/21/2022)   Hunger Vital Sign    Worried About Running Out of Food in the Last Year: Never true    Ran Out of Food in the Last Year: Never true  Transportation Needs: No Transportation Needs (06/21/2022)   PRAPARE - Hydrologist (Medical): No    Lack of Transportation (Non-Medical): No  Physical Activity: Not on file  Stress: Not on file  Social Connections: Not on file  Intimate Partner Violence: Not At Risk (06/21/2022)   Humiliation, Afraid, Rape, and Kick questionnaire     Fear of Current or Ex-Partner: No    Emotionally Abused: No    Physically Abused: No    Sexually Abused: No    FAMILY HISTORY: Family History  Problem Relation Age of Onset   Dementia Mother    Stroke Father    Atrial fibrillation Father    Hypertension Father    Hypertension Paternal Grandfather    Dementia Maternal Grandmother     ALLERGIES:  is allergic to linezolid and vancomycin.  MEDICATIONS:  Current Outpatient Medications  Medication Sig Dispense Refill   acetaminophen (TYLENOL) 325 MG tablet Take 2 tablets (650 mg total) by mouth every 6 (six) hours as needed. 36 tablet 0   acyclovir (ZOVIRAX) 400 MG tablet Take 1 tablet (400 mg total) by mouth 2 (two) times daily. 180 tablet 3   amiodarone (PACERONE) 200 MG tablet Take 1 tablet (200 mg total) by mouth daily. 180 tablet 1   apixaban (ELIQUIS) 5 MG TABS tablet Take 2 tablets (10 mg total) by mouth 2 (two) times daily for 7 days. 28 tablet 0   [START ON 06/29/2022] apixaban (ELIQUIS) 5 MG TABS tablet Take 1 tablet (5 mg total) by mouth 2 (two) times daily. 60 tablet 11   cholecalciferol (VITAMIN D3) 25 MCG (1000 UNIT) tablet Take 1,000 Units by mouth daily.     clobetasol cream (TEMOVATE) [id number] % Apply 1 Application topically daily as needed (rash).     Cyanocobalamin (VITAMIN B-12 PO) Take 1,000 mcg by mouth daily. Not sure the dosage     dexamethasone (DECADRON) 4 MG tablet Take 1 tablet Orally 4 times daily for 3 days     diltiazem (CARDIZEM CD) 240 MG 24 hr capsule Take 1 capsule (240 mg total) by mouth daily. 30 capsule 2   fluticasone (FLONASE) 50 MCG/ACT nasal spray Place 1 spray into both nostrils daily as needed for allergies or rhinitis.     folic acid (FOLVITE) 1 MG tablet Take 1 mg by mouth daily.     furosemide (LASIX) 40 MG tablet Take 40 mg by mouth daily as needed for edema.     gabapentin (NEURONTIN) 300 MG capsule TAKE TWO CAPSULES BY MOUTH TWICE A DAY (Patient taking differently: Take 600 mg by mouth 2  (two) times daily.) 360 capsule 1   hydrALAZINE (APRESOLINE) 25 MG tablet Take 1.5 tablets (37.5 mg total) by mouth 3 (three) times daily. 405 tablet 1   HYDROcodone-acetaminophen (NORCO) 7.5-325 MG tablet Take 1 tablet by mouth every 6 (six) hours as needed for moderate pain.     lisinopril (ZESTRIL) 40 MG tablet TAKE ONE TABLET BY MOUTH ONE TIME DAILY (Patient taking differently: Take 40 mg by mouth daily.) 90 tablet 3   methocarbamol (ROBAXIN) 750 MG  tablet Take 750 mg by mouth 3 (three) times daily.     Omega-3 Fatty Acids (FISH OIL PO) Take 1 Capful by mouth daily.     potassium chloride SA (KLOR-CON M) 20 MEQ tablet Take 2 tablets (40 mEq total) by mouth daily. 10 tablet 0   sodium chloride (OCEAN) 0.65 % SOLN nasal spray Place 1 spray into both nostrils as needed for congestion.     traMADol (ULTRAM) 50 MG tablet Take 1 tablet (50 mg total) by mouth every 6 (six) hours as needed. (Patient taking differently: Take 50 mg by mouth every 6 (six) hours as needed for moderate pain.) 30 tablet 0   triamcinolone cream (KENALOG) 0.1 % Apply 1 application topically daily as needed (itching).     No current facility-administered medications for this visit.    REVIEW OF SYSTEMS:   Constitutional: ( - ) fevers, ( - )  chills , ( - ) night sweats Eyes: ( - ) blurriness of vision, ( - ) double vision, ( - ) watery eyes Ears, nose, mouth, throat, and face: ( - ) mucositis, ( - ) sore throat Respiratory: ( - ) cough, ( - ) dyspnea, ( - ) wheezes Cardiovascular: ( - ) palpitation, ( - ) chest discomfort, ( - ) lower extremity swelling Gastrointestinal:  ( - ) nausea, ( - ) heartburn, ( - ) change in bowel habits Skin: ( - ) abnormal skin rashes Lymphatics: ( - ) new lymphadenopathy, ( - ) easy bruising Neurological: ( - ) numbness, ( - ) tingling, ( - ) new weaknesses Behavioral/Psych: ( - ) mood change, ( - ) new changes  All other systems were reviewed with the patient and are negative.  PHYSICAL  EXAMINATION: ECOG PERFORMANCE STATUS: 2 - Symptomatic, <50% confined to bed  Vitals:   06/23/22 1056  BP: (!) 143/61  Pulse: 67  Resp: 15  Temp: 97.9 F (36.6 C)  SpO2: 99%    Filed Weights   06/23/22 1056  Weight: 244 lb 14.4 oz (111.1 kg)     GENERAL: Well-appearing elderly Caucasian male, alert, no distress and comfortable SKIN: no overt abnormalities of the skin.  EYES: conjunctiva are pink and non-injected, sclera clear LUNGS: clear to auscultation and percussion with normal breathing effort HEART: regular rate & rhythm and no murmurs and no lower extremity edema Musculoskeletal: no cyanosis of digits and no clubbing  PSYCH: alert & oriented x 3, fluent speech NEURO: no focal motor/sensory deficits  LABORATORY DATA:  I have reviewed the data as listed    Latest Ref Rng & Units 06/23/2022   10:13 AM 06/22/2022    5:44 AM 06/21/2022    8:00 AM  CBC  WBC 4.0 - 10.5 K/uL 8.1  6.6  7.0   Hemoglobin 13.0 - 17.0 g/dL 9.9  9.0  10.3   Hematocrit 39.0 - 52.0 % 29.6  28.0  31.6   Platelets 150 - 400 K/uL 178  139  165        Latest Ref Rng & Units 06/23/2022   10:13 AM 06/22/2022    5:44 AM 06/21/2022    8:00 AM  CMP  Glucose 70 - 99 mg/dL 151  102  103   BUN 8 - 23 mg/dL 15  15  16   $ Creatinine 0.61 - 1.24 mg/dL 0.77  0.90  0.75   Sodium 135 - 145 mmol/L 137  139  135   Potassium 3.5 - 5.1 mmol/L 3.7  3.1  3.0   Chloride 98 - 111 mmol/L 104  106  104   CO2 22 - 32 mmol/L 23  25  26   $ Calcium 8.9 - 10.3 mg/dL 7.8  7.8  7.6   Total Protein 6.5 - 8.1 g/dL 5.7  4.6    Total Bilirubin 0.3 - 1.2 mg/dL 0.4  0.4    Alkaline Phos 38 - 126 U/L 112  74    AST 15 - 41 U/L 13  12    ALT 0 - 44 U/L 19  19      Lab Results  Component Value Date   MPROTEIN 0.1 (H) 05/27/2022   MPROTEIN 0.1 (H) 05/11/2022   MPROTEIN 0.1 (H) 04/20/2022   Lab Results  Component Value Date   KPAFRELGTCHN 233.8 (H) 05/27/2022   KPAFRELGTCHN 197.9 (H) 05/11/2022   KPAFRELGTCHN 287.6 (H)  04/20/2022   LAMBDASER 2.5 (L) 05/27/2022   LAMBDASER 2.6 (L) 05/11/2022   LAMBDASER 3.1 (L) 04/20/2022   KAPLAMBRATIO 93.52 (H) 05/27/2022   KAPLAMBRATIO 76.12 (H) 05/11/2022   KAPLAMBRATIO 92.77 (H) 04/20/2022     RADIOGRAPHIC STUDIES: ECHOCARDIOGRAM COMPLETE  Result Date: 06/22/2022    ECHOCARDIOGRAM REPORT   Patient Name:   Travis [name] [facility] Date of Exam: 06/22/2022 Medical Rec #:  [id number]        Height:       70.0 in Accession #:    [id number]       Weight:       244.3 lb Date of Birth:  [birth year]       BSA:          2.272 m Patient Age:    1 years         BP:           132/74 mmHg Patient Gender: M                HR:           65 bpm. Exam Location:  Inpatient Procedure: 2D Echo, Color Doppler, Cardiac Doppler and Intracardiac            Opacification Agent Indications:    I48.91* Unspeicified atrial fibrillation  History:        Patient has prior history of Echocardiogram examinations, most                 recent 09/01/2021. Abnormal ECG, Arrythmias:Atrial Fibrillation,                 Signs/Symptoms:Chest Pain and Edema; Risk Factors:Hypertension                 and Sleep Apnea. Chemotherapy. Myeloma.  Sonographer:    Roseanna Rainbow RDCS Referring Phys: [id number] MIR M [facility]  Sonographer Comments: Technically difficult study due to poor echo windows and patient is obese. Image acquisition challenging due to patient body habitus. Patient in fowler's position. Patient post spinal surgery 2/2. IMPRESSIONS  1. Appears to be in sinus rhythm at the time of evaluation- A waves present on diastology. Left ventricular ejection fraction, by estimation, is 60 to 65%. The left ventricle has normal function. The left ventricle has no regional wall motion abnormalities. There is mild concentric left ventricular hypertrophy. Left ventricular diastolic parameters are consistent with Grade II diastolic dysfunction (pseudonormalization).  2. Right ventricular systolic function is normal. The right ventricular size  is normal. There is normal pulmonary artery systolic pressure.  3. The mitral valve is grossly normal. No evidence of mitral valve  regurgitation.  4. The aortic valve is tricuspid. Aortic valve regurgitation is not visualized. No aortic stenosis is present.  5. The inferior vena cava is normal in size with greater than 50% respiratory variability, suggesting right atrial pressure of 3 mmHg. FINDINGS  Left Ventricle: Appears to be in sinus rhythm at the time of evaluation- A waves present on diastology. Left ventricular ejection fraction, by estimation, is 60 to 65%. The left ventricle has normal function. The left ventricle has no regional wall motion abnormalities. Definity contrast agent was given IV to delineate the left ventricular endocardial borders. The left ventricular internal cavity size was normal in size. There is mild concentric left ventricular hypertrophy. Left ventricular diastolic parameters are consistent with Grade II diastolic dysfunction (pseudonormalization). Right Ventricle: The right ventricular size is normal. No increase in right ventricular wall thickness. Right ventricular systolic function is normal. There is normal pulmonary artery systolic pressure. The tricuspid regurgitant velocity is 2.33 m/s, and  with an assumed right atrial pressure of 3 mmHg, the estimated right ventricular systolic pressure is [id number] mmHg. Left Atrium: Left atrial size was normal in size. Right Atrium: Right atrial size was normal in size. Pericardium: There is no evidence of pericardial effusion. Mitral Valve: The mitral valve is grossly normal. No evidence of mitral valve regurgitation. Tricuspid Valve: The tricuspid valve is normal in structure. Tricuspid valve regurgitation is not demonstrated. No evidence of tricuspid stenosis. Aortic Valve: The aortic valve is tricuspid. Aortic valve regurgitation is not visualized. No aortic stenosis is present. Pulmonic Valve: The pulmonic valve was grossly normal.  Pulmonic valve regurgitation is not visualized. Aorta: The aortic root and ascending aorta are structurally normal, with no evidence of dilitation. Venous: The inferior vena cava is normal in size with greater than 50% respiratory variability, suggesting right atrial pressure of 3 mmHg. IAS/Shunts: No atrial level shunt detected by color flow Doppler.  LEFT VENTRICLE PLAX 2D LVIDd:         5.20 cm   Diastology LVIDs:         3.30 cm   LV e' medial:    6.53 cm/s LV PW:         1.20 cm   LV E/e' medial:  15.5 LV IVS:        1.20 cm   LV e' lateral:   11.60 cm/s LVOT diam:     2.60 cm   LV E/e' lateral: 8.7 LV SV:         133 LV SV Index:   58 LVOT Area:     5.31 cm  RIGHT VENTRICLE             IVC RV S prime:     18.80 cm/s  IVC diam: 2.60 cm TAPSE (M-mode): 3.0 cm LEFT ATRIUM             Index        RIGHT ATRIUM           Index LA diam:        4.30 cm 1.89 cm/m   RA Area:     20.90 cm LA Vol (A2C):   84.9 ml 37.36 ml/m  RA Volume:   60.10 ml  26.45 ml/m LA Vol (A4C):   66.4 ml 29.22 ml/m LA Biplane Vol: 74.9 ml 32.96 ml/m  AORTIC VALVE LVOT Vmax:   127.00 cm/s LVOT Vmean:  84.000 cm/s LVOT VTI:    0.250 m  AORTA Ao Root diam: 3.70 cm Ao Asc diam:  3.70 cm MITRAL VALVE                TRICUSPID VALVE MV Area (PHT): 4.21 cm     TR Peak grad:   21.7 mmHg MV Decel Time: 180 msec     TR Vmax:        233.00 cm/s MV E velocity: 101.00 cm/s MV A velocity: 54.00 cm/s   SHUNTS MV E/A ratio:  1.87         Systemic VTI:  0.25 m                             Systemic Diam: 2.60 cm Rudean Haskell MD Electronically signed by Rudean Haskell MD Signature Date/Time: 06/22/2022/9:37:19 AM    Final    DG Thoracic Spine 2 View  Result Date: 06/21/2022 CLINICAL DATA:  Trauma EXAM: THORACIC SPINE 2 VIEWS COMPARISON:  Cone beam CT 06/04/22 FINDINGS: Assessment is limited due to patient positioning. Post surgical changes from fusion at the cervicothoracic junction. Hardware is intact with unchanged alignment compared to  immediate post-operative cone-beam CT. There is no evidence of thoracic spine fracture. No other significant bone abnormalities are identified. IMPRESSION: Limited assessment due to patient positioning. Within this limitation - no evidence of thoracic spine fracture. If there is high clinical concern fro a cervical or thoracic spine injury, further evaluation with CT is recommended. Electronically Signed   By: Marin Roberts M.D.   On: 06/21/2022 10:55   VAS [place] LOWER EXTREMITY VENOUS (DVT) (7a-7p)  Result Date: 06/21/2022  Lower Venous DVT Study Patient Name:  Travis [name] [facility]  Date of Exam:   06/21/2022 Medical Rec #: [id number]         Accession #:    [id number] Date of Birth: May 21, 1950        Patient Gender: M Patient Age:   75 years Exam Location:  [facility] Procedure:      VAS [place] LOWER EXTREMITY VENOUS (DVT) Referring Phys: Wille Glaser KNAPP --------------------------------------------------------------------------------  Indications: Asymmetrical swelling x2 weeks LT>RT, s/p thoracic spine surgery 06/04/2022.  Risk Factors: History of DVT. Anticoagulation: Eliquis. Limitations: Poor ultrasound/tissue interface. Comparison Study: No prior studies available. Performing Technologist: Darlin Coco RDMS, RVT  Examination Guidelines: A complete evaluation includes B-mode imaging, spectral Doppler, color Doppler, and power Doppler as needed of all accessible portions of each vessel. Bilateral testing is considered an integral part of a complete examination. Limited examinations for reoccurring indications may be performed as noted. The reflux portion of the exam is performed with the patient in reverse Trendelenburg.  +-----+---------------+---------+-----------+----------+--------------+ RIGHTCompressibilityPhasicitySpontaneityPropertiesThrombus Aging +-----+---------------+---------+-----------+----------+--------------+ CFV  Full           Yes      Yes                                  +-----+---------------+---------+-----------+----------+--------------+   +---------+---------------+---------+-----------+----------+--------------+ LEFT     CompressibilityPhasicitySpontaneityPropertiesThrombus Aging +---------+---------------+---------+-----------+----------+--------------+ CFV      Full           Yes      Yes                                 +---------+---------------+---------+-----------+----------+--------------+ SFJ      Full                                                        +---------+---------------+---------+-----------+----------+--------------+  FV Prox  Full                                                        +---------+---------------+---------+-----------+----------+--------------+ FV Mid   Full                                                        +---------+---------------+---------+-----------+----------+--------------+ FV DistalFull                                                        +---------+---------------+---------+-----------+----------+--------------+ PFV      Full                                                        +---------+---------------+---------+-----------+----------+--------------+ POP      Full           Yes      Yes                                 +---------+---------------+---------+-----------+----------+--------------+ PTV      Full                                                        +---------+---------------+---------+-----------+----------+--------------+ PERO     Partial        No       No                   Acute          +---------+---------------+---------+-----------+----------+--------------+     Summary: RIGHT: - No evidence of common femoral vein obstruction.  LEFT: - Findings consistent with acute deep vein thrombosis involving the left peroneal veins. - No cystic structure found in the popliteal fossa.  *See table(s) above for measurements and observations.  Electronically signed by Servando Snare MD on 06/21/2022 at 10:46:28 AM.    Final    DG Chest 2 View  Result Date: 06/21/2022 CLINICAL DATA:  Weakness EXAM: CHEST - 2 VIEW COMPARISON:  CXR 03/10/22 FINDINGS: Assessment of the bilateral lung apices limited due to patient positioning/chin down technique. No pleural effusion. No pneumothorax. No focal airspace opacity. Unchanged cardiac and mediastinal contours. Visualized upper abdomen is unremarkable. Possible age indeterminate fracture of the anterior/lateral left sixth rib. Visualized upper abdomen is unremarkable. IMPRESSION: 1. Possible age indeterminate fracture of the anterolateral left sixth rib. Correlate with point tenderness. 2. No focal airspace opacity. Electronically Signed   By: Marin Roberts M.D.   On: 06/21/2022 08:38   DG Cervical Spine 2 or 3 views  Result Date: 06/04/2022 CLINICAL DATA:  Elective surgery EXAM:  CERVICAL SPINE - 2-3 VIEW COMPARISON:  MRI cervical spine 05/27/2022. Cervical spine x-ray 05/13/2022 FINDINGS: Two views of the cervical spine were submitted, intraoperative. No hardware identified. Fluoroscopy time 15 seconds. Dose: 1.76 micro Gy. IMPRESSION: Intraoperative cervical spine as above. Electronically Signed   By: Ronney Asters M.D.   On: 06/04/2022 15:25   DG C-Arm 1-60 Min-No Report  Result Date: 06/04/2022 Fluoroscopy was utilized by the requesting physician.  No radiographic interpretation.   DG O-ARM IMAGE ONLY/NO REPORT  Result Date: 06/04/2022 There is no Radiologist interpretation  for this exam.  CT THORACIC SPINE W CONTRAST  Result Date: 06/04/2022 CLINICAL DATA:  Metastatic disease.  Preoperative assessment EXAM: CT THORACIC SPINE WITH CONTRAST TECHNIQUE: Multidetector CT images of thoracic was performed according to the standard protocol following intravenous contrast administration. RADIATION DOSE REDUCTION: This exam was performed according to the departmental dose-optimization program which includes  automated exposure control, adjustment of the mA and/or kV according to patient size and/or use of iterative reconstruction technique. CONTRAST:  39m OMNIPAQUE IOHEXOL 350 MG/ML SOLN COMPARISON:  05/29/2022 FINDINGS: Alignment: Exaggerated thoracic kyphosis. Vertebrae: Suboptimal coverage especially considering level of primary concern. Partially covered T2 vertebra with known tumoral erosion of the vertebral body with minimal remaining inferior endplate visible. Compression fracture of this vertebra with advanced narrowing. Diffuse heterogeneity of marrow with tumor extent better assessed on prior MRI, most notable deposits in the T8 body, T11 spinous process, and L1 left pedicle. Additional rib lesions on the right at T6 and T8 primarily. Paraspinal and other soft tissues: Paravertebral tumor eccentric to the left at T2. Disc levels: Ordinary thoracic spondylosis. Request was made for additional imaging but declined due to pending surgery IMPRESSION: 1. Suboptimal exam with only partial coverage of the T2 level where there is extensive tumor replacement of the vertebral body with collapse and paravertebral/spinal canal mass. 2. Additional lesions also seen on recent MRI. Electronically Signed   By: JJorje GuildM.D.   On: 06/04/2022 07:44   MR LUMBAR SPINE W WO CONTRAST  Result Date: 05/29/2022 CLINICAL DATA:  72year old male with multiple myeloma. 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 EXAM: MRI LUMBAR SPINE WITHOUT AND WITH CONTRAST TECHNIQUE: Multiplanar and multiecho pulse sequences of the lumbar spine were obtained without and with intravenous contrast. CONTRAST:  118mGADAVIST GADOBUTROL 1 MMOL/ML IV SOLN thoracic in conjunction with contrast enhanced imaging of the chest, abdomen, and pelvis reported separately. COMPARISON:  Thoracic MRI today reported separately. Prior lumbar MRI 06/25/2019. FINDINGS: Segmentation:  Normal, concordant with the thoracic numbering today and the same designation used in 2021. Alignment:  Stable lumbar vertebral height and alignment since 2021. Vertebrae: Confluent up to 2.3 cm tumor in the left L1 posterior elements including the pedicle (series 11, image 7). Small 8 mm tumor in the inferior L2 body. And larger 2 cm tumor in the left L2 pedicle. Subcentimeter tumor inferior L3 vertebral body. Larger 10 mm tumor posterior L4 vertebral body. L5 small 9 mm and smaller tumor in the right L5 body. Scattered subcentimeter left sacral ala tumor. These lesions are new since 2021. No lumbar or visible sacral pathologic fracture. No extraosseous or epidural tumor identified. Conus medullaris and cauda equina: Conus extends to the L1 level. No lower spinal cord or conus signal abnormality. No abnormal intradural enhancement. No dural thickening identified. Paraspinal and other soft tissues: Negative for age visible abdominal viscera. Negative lumbar paraspinal soft tissues. Disc  levels: Lumbar spine degeneration not significantly changed since 2021 Aside from new moderate size central disc protrusion at L3-L4 (series 11, image 22) now resulting in mild to moderate spinal stenosis at that level. IMPRESSION: 1. Generally small Multiple Myeloma lesions scattered at all lumbar levels and in the visible left sacral ala. Largest lumbar tumor is 2.3 cm in the left L1 posterior elements and pedicle. No lumbar extraosseous or epidural tumor extension, or pathologic fracture. 2. 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 Electronically Signed   By: Genevie Ann M.D.   On: 05/29/2022 12:22   MR THORACIC SPINE W WO CONTRAST  Result Date: 05/29/2022 CLINICAL DATA:  72 year old male with multiple myeloma. Increasing neck pain and bulky T2 plasmacytoma, pathologic fracture and cord compression on  recent cervical spine MRI. EXAM: MRI THORACIC WITHOUT AND WITH CONTRAST TECHNIQUE: Multiplanar and multiecho pulse sequences of the thoracic spine were obtained without and with intravenous contrast. CONTRAST:  68m GADAVIST GADOBUTROL 1 MMOL/ML IV SOLN COMPARISON:  Cervical spine MRI without and with contrast 05/27/2022. FINDINGS: Limited cervical spine imaging: Stable compared to the recent 05/27/2022 exam. Thoracic spine segmentation:  Appears to be normal. Alignment: Exaggerated upper thoracic kyphosis and part related to the T2 pathologic lesion. Vertebrae: T1 better demonstrated on the recent cervical comparison. Highly abnormal T2 level with retropulsed and epidural tumor. Subsequent cord compression which does not appear significantly changed since 05/27/2022 (series 22, image 9, series 23, images 7 and 8). No definite cord edema or myelomalacia. Bulky ventral and left lateral extraosseous tumor extension as previously noted. Tumor in the T2 lamina and spinous process also. See series 26, image 9). Mild right T3 posteroinferior vertebral body tumor tracking toward the pedicle. No pathologic fracture. No tumor identified at T4, T5. However, there is posterior right 5th rib tumor on series 26, image 26. Similar small 5 mm enhancing tumor in the left T6 inferior body, endplate. No tumor identified at T7. However, there is posterior right 7th rib tumor (series 27, image 5). T8 12 mm tumor in the anterior body on the right side. No pathologic fracture. No T9 tumor. T10 inferior endplate 12 mm tumor with subtle associated inferior endplate compression. T11 bulky 18 mm midline lamina and spinous process tumor, but no extraosseous extension. No T12 tumor. Lumbar spine detailed separately. Cord: Normal spinal canal patency and thoracic cord below the T2-T3 disc level. Cord signal and morphology remains normal to the conus at L1. No abnormal intradural enhancement. No definite dural thickening. Paraspinal and other  soft tissues: T2 paraspinal tumor eccentric to the left as above. Small layering pleural effusions. Negative visible other chest and upper abdominal viscera. Disc levels: Mild for age underlying thoracic spine degeneration. No degenerative spinal stenosis. IMPRESSION: 1. 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 No change compared to 05/27/2022. No cord edema or myelomalacia identified. 2. Small tumor foci at most other thoracic levels, with no other thoracic extraosseous or epidural tumor extension. Posterior rib involvement intermittently noted. 3. Small layering pleural effusions. 4. Lumbar MRI today is reported separately. Electronically Signed   By: HGenevie AnnM.D.   On: 05/29/2022 12:11   MR CERVICAL SPINE W WO CONTRAST  Addendum Date: 05/28/2022   ADDENDUM REPORT: 05/28/2022 16:46 ADDENDUM: Critical Value/emergent results were called by telephone at the time of interpretation  on 05/28/2022 at 4:40 pm to provider on-call Heath Lark, who verbally acknowledged these results. Electronically Signed   By: Richardean Sale M.D.   On: 05/28/2022 16:46   Result Date: 05/28/2022 CLINICAL DATA:  Multiple myeloma. Increasing neck pain/tenderness with limited range of motion. EXAM: MRI CERVICAL SPINE WITHOUT AND WITH CONTRAST TECHNIQUE: Multiplanar and multiecho pulse sequences of the cervical spine, to include the craniocervical junction and cervicothoracic junction, were obtained without and with intravenous contrast. CONTRAST:  44m GADAVIST GADOBUTROL 1 MMOL/ML IV SOLN COMPARISON:  Radiographs of the cervical and thoracic spine 05/13/2022. Chest CTA 03/15/2022. Cervical MRI 03/06/2020. FINDINGS: Alignment: The alignment of the cervical spine is anatomic, although progressive kyphosis has developed at T2 secondary to a pathologic fracture. Vertebrae: As demonstrated on previous chest CTA, there are  lytic lesions in the thoracic spine with associated enhancement following contrast. A large lesion at T2 has progressed and is associated with a new marked pathologic fracture with osseous retropulsion by 6 mm. Resulting cord compression. There is near complete replacement of the T2 vertebral body by tumor, measuring up to 4.8 x 4.0 cm transverse on axial image 47/12. There are additional smaller myelomatous lesions within the T1 vertebral body, within the right C7 and right T3 articulating facets. No other pathologic fractures are identified. Cord: The cervical cord is normal in signal and caliber. There is posterior displacement and mild compression of the thoracic cord at T2 by the pathologic fracture described above. Posterior Fossa, vertebral arteries, paraspinal tissues: Linear enhancement within the right cerebellar hemisphere is likely due to an incidental venous angioma. No associated significant linear T2 hyperintensity. The visualized posterior fossa is otherwise unremarkable.Bilateral vertebral artery flow voids. No significant paraspinal findings. Disc levels: C2-3: Bilateral facet hypertrophy contributes to chronic left foraminal narrowing. No cord deformity. C3-4: The disc appears normal. The left facet joint appears ankylosed. Chronic moderate left foraminal narrowing. C4-5: Chronic disc bulging with asymmetric uncinate spurring on the right and bilateral facet hypertrophy. Stable mild spinal stenosis without cord deformity. Moderate right-greater-than-left foraminal narrowing is similar to the previous study. C5-6: Chronic spondylosis with posterior osteophytes covering diffusely bulging disc material. Mild facet hypertrophy. Stable mild spinal stenosis and mild foraminal narrowing bilaterally. C6-7: Chronic spondylosis with posterior osteophytes covering diffusely bulging disc material. Mild bilateral facet hypertrophy. Mild foraminal narrowing bilaterally. C7-T1: Normal interspace. As above,  pathologic fracture at T2 with significant osseous retropulsion and resulting cord compression. There is moderate left greater than right foraminal narrowing at the T1-2 and T2-3 disc space levels. IMPRESSION: 1. Interval progression of multiple myeloma in the thoracic spine compared with previous chest CT of 10 weeks ago. There is a new pathologic fracture at T2 with significant osseous retropulsion and resulting cord compression. No abnormal cord signal identified. 2. Additional smaller lesions within the T1 vertebral body and right C7 and T3 articulating facets. No other pathologic fractures identified. 3. Mild multilevel cervical spondylosis with resulting mild spinal stenosis and mild to moderate foraminal narrowing at multiple levels as described, similar to previous MRI. No cord compression in the cervical spine. Electronically Signed: By: WRichardean SaleM.D. On: 05/28/2022 16:11    ASSESSMENT & PLAN WSIAM CLEARYis a 72y.o. male with medical history significant for IgA Kappa Multiple Myeloma who presents for a follow up visit.   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  He meets diagnostic criteria by having a  serum free light chain ratio greater than 100 and lytic lesions causing pathological fracture of the humerus.  Given this I would recommend we pursued Darzalex, Revlimid, and dexamethasone.  Given his advanced age I would not recommend transplantation, that this is something we can consider when it comes time to transition to maintenance therapy versus transplant.  The patient voices understanding of the plan moving forward.  # IgA Kappa Multiple Myeloma --Metastatic survey completed 12/15/2021 --Patient underwent fixation of his humerus on 12/17/2021, pathology confirmed plasmacytoma/plasma cell neoplasm.  --Received 6 cycles of Darzalex, Revlimid, and dexamethasone started on  12/28/2021-05/18/2022.  --Due to worsening myeloma osseous lesions including pathologic fracture at T2 with cord compression, Dr. Lorenso Courier recommends changing therapy to Velcade/Pomalyst/Dex.  Plan: --today is Cycle 1, Day 1 of Velcade/Pom/Dex --labs from today showed white blood cell 8.1, hemoglobin 9.9, MCV 98.7, and platelets of 178 --restaging labs from 05/27/2022 with SPEP and SFLC showed M protein is stable at 0.1. Kappa increased to 233.8, Lambda 2.5, Ratio 93.2. Repeat MM panel today.  --Proceed with treatment today without any dose modifications.  --RTC in 2 weeks with interval q 2 week treatments  # Rash Concerning for Shingles- improving -- Prescribed valacyclovir 3 times daily 1000 mg x 10 days-completed the course -- We will continue to monitor rash closely.  #DVT: --Found to have acute DVT involving left peroneal veins. Likely secondary to recent surgery and hospitalization --Currently on increased dose of Eliquis 10 mg BID x 7 days and then transition back to 5 mg BID.   #Hypokalemia: --Currently on potassium chloride 40 mEq twice daily as prescribed. Potassium level is normal today.   #Supportive Care -- chemotherapy education complete -- port placement not required -- zofran 44m q8H PRN and compazine 112mPO q6H for nausea -- acyclovir 40079mO BID for VCZ prophylaxis -- Started Zometa q 12 weeks on 05/18/2022. Next dose April 2024.    No orders of the defined types were placed in this encounter.   All questions were answered. The patient knows to call the clinic with any problems, questions or concerns.  A total of more than 30 minutes were spent on this encounter with face-to-face time and non-face-to-face time, including preparing to see the patient, ordering tests and/or medications, counseling the patient and coordination of care as outlined above.   IreDede Query-C [facility]:  [phone]/21/2024 12:35 PM

## 2022-06-23 NOTE — Telephone Encounter (Addendum)
Oral Chemotherapy Pharmacy Student Encounter  Start Date: 06/23/2022   Patient Education I spoke with patient for overview of new oral chemotherapy medication: Pomalyst (pomalidomide) for the treatment of R/R multiple myeloma, in conjunction with bortezomib and dexamethasone, planned duration until disease progression or unacceptable toxicity.   Pt is doing very well. Counseled patient on administration, dosing, side effects, monitoring, drug-food interactions, safe handling, storage, and disposal.  Patient will take Pomalyst 4 mg capsule, 1 capsule by mouth once daily for days 1-21, hold for 7 days, repeating every 28 days.  Side effects include but not limited to: fatigue, constipation/diarrhea, blood cell/liver/kidney levels/function changes, and nausea/vomiting. Patient indicated he has multiple nausea medications at home and we discussed the differences between ondansetron and prochlorperazine.   Patient on Eliquis - appropriate coverage for VTE PPX for current regimen.   Reviewed with patient importance of keeping a medication schedule and plan for any missed doses.  After discussion with patient, no patient barriers to medication adherence identified.   Mr. Mastin and his wife voiced understanding and appreciation. All questions answered. Medication handout provided.  Provided patient with [facility] phone number. Patient knows to call the office with questions or concerns.  [employer] PharmD Candidate [phone] 06/23/2022 12:00 PM

## 2022-06-24 LAB — KAPPA/LAMBDA LIGHT CHAINS
Kappa free light chain: 138.1 mg/L — ABNORMAL HIGH (ref 3.3–19.4)
Kappa, lambda light chain ratio: 40.62 — ABNORMAL HIGH (ref 0.26–1.65)
Lambda free light chains: 3.4 mg/L — ABNORMAL LOW (ref 5.7–26.3)

## 2022-06-24 NOTE — Telephone Encounter (Signed)
**Note De-Identified [place] Obfuscation** Patient contacted regarding discharge from [facility] on 06/22/2022.  Patient understands to follow up with provider Ambrose Pancoast, NP on 07/02/2022 at 1:55 at [address], [place] in [place],  [id number].  Patient understands discharge instructions? Yes Patient understands medications and regiment? Yes Patient understands to bring all medications to this visit? Yes  Ask patient:  Are you enrolled in My Chart: Yes  The pt denies having any CP/discomfort, SOB, nausea, vomiting, diaphoresis, abn. heart beats, dizziness or lightheadedness since his hospital d/c on 06/22/22.  He verified that he does have [facility] phone number and his is aware to call us if he has any questions or concerns. He thanked me for calling him.

## 2022-06-26 NOTE — Progress Notes (Signed)
Radiation Oncology         [phone] ________________________________  Follow-up New Visit   Outpatient   Name: Travis [name] MRN: [id number]  Date: 06/28/2022  DOB: 17-Dec-1950  [id number], [name], MD  Renaldo Harrison, MD   REFERRING PHYSICIAN: Renaldo Harrison, MD  DIAGNOSIS: There were no encounter diagnoses.  Multiple myeloma with lytic lesions in the right humerus, right ulna, right 6th rib and thoracic spine    Interval Since Last Radiation: almost 1 month   Radiation Treatment Date: 05/29/22 Diagnosis: Multiple Myeloma with spinal cord compression at T2 Site/Dose: T2 treated with 3.0 Gy delivered in 1 Fx  1) Intent: Palliative Radiation Treatment Dates: 01/06/2022 through 01/19/2022 Site Technique Total Dose (Gy) Dose per Fx (Gy) Completed Fx Beam Energies  Humerus, Right: Ext_R_Humerus Complex 20/20 2 10/10 6X, 10X  Arm, Right: Ext_R_Forearm Complex 20/20 2 10/10 6X  Chest, Right: Chest_R_Rib Complex 20/20 2 10/10 10X    Narrative / Interval History ::Travis [name] is a 72 y.o. male who is accompanied by ***. he is returns today to discuss the role of post-operative radiation therapy as part of management for his recently diagnosed multiple myeloma. To review, the patient underwent urgent radiation treatment to T2 on 05/29/22. He remained inpatient following radiation to undergo surgery under the direction of Dr. Ashok Pall. At the time of his inpatient consultation, the patient was informed that he would likely require postop radiation treatments at a later date.  In the interval since the patient was seen in consultation, he accordingly underwent T1-T4 posterior lateral arthrodesis/fusion and T2 corpectomy for tumor resection on 06/04/22 under the care of Dr. Christella Noa. Biopsy of the T2 vertebral body collected at the time of his procedure revealed findings compatible with plasmacytoma/plasma cell myeloma.   - Pre-op CT of the thoracic spine with contrast on  06/03/22 showed extensive tumor replacement of the T2 vertebral body with collapse and paravertebral/spinal canal mass.   The patient was seen by Dr. Alvy Bimler while inpatient who recommended around-the-clock high-dose dexamethasone while inpatient. In regards to his prior treatment regimen, Revlimid was held while the patient was hospitalized.   The patient was also seen by Dr. Lorenso Courier while inpatient on 06/07/22. In light of disease progression while on his prior chemotherapy regimen consisting of Daratumumab and Revlimid, Dr. Lorenso Courier has advised transitioning to a Velcade based regimen.   Post-operatively, the patient developed issues with swelling in his left LE. He was given lasix for this and would regularly elevate his leg without improvement. He also had some associated numbness in his left foot. On 06/21/22, the patient presented to the ED after suffering a fall at home. Work-up revealed new post-op DVT in the left LE as well as a-fib noted on arrival. At discharge, the patient was instructed to use a higher dose of his blood thinner at discharge for the next 7 days. He was also given a pair of TED hose and discharged with Cardizem for management of a-fib.( X-ray of the thoracic spine performed in the ED was limited but showed no evidence of thoracic spine fracture. Chest x-ray also performed showed a possible age indeterminate fracture of the anterolateral left sixth rib. No other abnormalities were appreciated in either lung).   The patient received his first cycle of Velcade/Pomalyst/Dex on 06/23/22. Treatment will be administered every 2 weeks.    HPI Initial Inpatient Consultation 05/29/22: Travis [name] is a 72 year old gentleman with a prior history of palliative radiation therapy as  documented above.  He was treated by Dr. Tyler Pita back in the early fall of last year.  Late last year the patient began experiencing some mild pain in his upper back.  This is progressed significantly over  the past several weeks.  He was seen by Dr. Hulan Saas who felt the patient may have progression of his myeloma.  The patient subsequently underwent an MRI of the cervical spine which showed probable cord compression in the upper thoracic spine at T2. The patient was subsequently admitted and placed on high-dose steroids. MRI this morning confirmed a cord compression at T2. Radiation oncology has been consulted for consideration for urgent treatment.                      PAST MEDICAL HISTORY:  Past Medical History:  Diagnosis Date   Arthritis    "comes and goes; mostly in hands, occasionally elbow" (0000000)   Complication of anesthesia    "just wanted to sleep alot  for couple days S/P VARICOCELE OR" (04/05/2017)   DVT (deep venous thrombosis) (HCC)    LLE   Dysrhythmia    PVCs    GERD (gastroesophageal reflux disease)    History of hiatal hernia    Hypertension    Impaired glucose tolerance    Multiple myeloma (HCC)    Obesity (BMI 30-39.9) 07/26/2016   OSA on CPAP 07/26/2016   Mild with AHI 12.5/hr now on CPAP at 14cm H2O   PAF (paroxysmal atrial fibrillation) ([place])    s/p PVI Ablation in 2018 // Flecainide Rx // Echo 8/21: EF 60-65, no RWMA, mild LVH, normal RV SF, moderate RVE, mild BAE, trivial MR, mild dilation of aortic root (40 mm)   Pneumonia    "may have had walking pneumonia a few years ago" (04/05/2017)   Pre-diabetes    Thrombophlebitis     PAST SURGICAL HISTORY: Past Surgical History:  Procedure Laterality Date   ATRIAL FIBRILLATION ABLATION  04/05/2017   ATRIAL FIBRILLATION ABLATION N/A 04/05/2017   Procedure: ATRIAL FIBRILLATION ABLATION;  Surgeon: Thompson Grayer, MD;  Location: [facility];  Service: Cardiovascular;  Laterality: N/A;   BONE BIOPSY Right 12/17/2021   Procedure: BONE BIOPSY;  Surgeon: Hiram Gash, MD;  Location: [place] [facility];  Service: Orthopedics;  Laterality: Right;   COMPLETE RIGHT HIP REPLACMENT  01/19/2019   EVALUATION  UNDER ANESTHESIA WITH HEMORRHOIDECTOMY N/A 02/24/2017   Procedure: EXAM UNDER ANESTHESIA WITH  HEMORRHOIDECTOMY;  Surgeon: Michael Boston, MD;  Location: [facility];  Service: General;  Laterality: N/A;   EXCISIONAL TOTAL HIP ARTHROPLASTY WITH ANTIBIOTIC SPACERS Right 09/25/2020   Procedure: EXCISIONAL TOTAL HIP ARTHROPLASTY WITH ANTIBIOTIC SPACERS;  Surgeon: Paralee Cancel, MD;  Location: [facility];  Service: Orthopedics;  Laterality: Right;   FLEXIBLE SIGMOIDOSCOPY N/A 04/15/2015   Procedure:  UNSEDATED FLEXIBLE SIGMOIDOSCOPY;  Surgeon: [name], MD;  Location: [facility];  Service: Endoscopy;  Laterality: N/A;   HUMERUS IM NAIL Right 12/17/2021   Procedure: INTRAMEDULLARY (IM) NAIL HUMERAL;  Surgeon: Hiram Gash, MD;  Location: [place];  Service: Orthopedics;  Laterality: Right;   KNEE ARTHROSCOPY Left ~ 2016   POSTERIOR CERVICAL FUSION/FORAMINOTOMY N/A 06/04/2022   Procedure: Thoracic one - Thoracic Four Posterior lateral arthrodesis/ fusion and Thoracic two Corpectomy;  Surgeon: Ashok Pall, MD;  Location: [place];  Service: Neurosurgery;  Laterality: N/A;   REIMPLANTATION OF TOTAL HIP Right 12/25/2020   Procedure: REIMPLANTATION/REVISION OF RIGHT TOTAL HIP  VERSUS REPEAT IRRIGATION AND DEBRIDEMENT;  Surgeon: Paralee Cancel, MD;  Location: [facility];  Service: Orthopedics;  Laterality: Right;   TESTICLE SURGERY  1988   VARICOCELE   TONSILLECTOMY     VARICOSE VEIN SURGERY Bilateral     FAMILY HISTORY:  Family History  Problem Relation Age of Onset   Dementia Mother    Stroke Father    Atrial fibrillation Father    Hypertension Father    Hypertension Paternal Grandfather    Dementia Maternal Grandmother     SOCIAL HISTORY:  Social History   Tobacco Use   Smoking status: Never   Smokeless tobacco: Never  Vaping Use   Vaping Use: Never used  Substance Use Topics   Alcohol use: Yes    Alcohol/week: 2.0 standard drinks of alcohol    Types: 2 Glasses of wine per week     Comment: 2 glasses of wine a week, occ bourbon, beer   Drug use: No    ALLERGIES:  Allergies  Allergen Reactions   Linezolid Other (See Comments)    Burning tongue    Vancomycin Rash    MEDICATIONS:  Current Outpatient Medications  Medication Sig Dispense Refill   acetaminophen (TYLENOL) 325 MG tablet Take 2 tablets (650 mg total) by mouth every 6 (six) hours as needed. 36 tablet 0   acyclovir (ZOVIRAX) 400 MG tablet Take 1 tablet (400 mg total) by mouth 2 (two) times daily. 180 tablet 3   amiodarone (PACERONE) 200 MG tablet Take 1 tablet (200 mg total) by mouth daily. 180 tablet 1   apixaban (ELIQUIS) 5 MG TABS tablet Take 2 tablets (10 mg total) by mouth 2 (two) times daily for 7 days. 28 tablet 0   [START ON 06/29/2022] apixaban (ELIQUIS) 5 MG TABS tablet Take 1 tablet (5 mg total) by mouth 2 (two) times daily. 60 tablet 11   cholecalciferol (VITAMIN D3) 25 MCG (1000 UNIT) tablet Take 1,000 Units by mouth daily.     clobetasol cream (TEMOVATE) [id number] % Apply 1 Application topically daily as needed (rash).     Cyanocobalamin (VITAMIN B-12 PO) Take 1,000 mcg by mouth daily. Not sure the dosage     dexamethasone (DECADRON) 4 MG tablet Take 1 tablet Orally 4 times daily for 3 days     diltiazem (CARDIZEM CD) 240 MG 24 hr capsule Take 1 capsule (240 mg total) by mouth daily. 30 capsule 2   fluticasone (FLONASE) 50 MCG/ACT nasal spray Place 1 spray into both nostrils daily as needed for allergies or rhinitis.     folic acid (FOLVITE) 1 MG tablet Take 1 mg by mouth daily.     furosemide (LASIX) 40 MG tablet Take 40 mg by mouth daily as needed for edema.     gabapentin (NEURONTIN) 300 MG capsule TAKE TWO CAPSULES BY MOUTH TWICE A DAY (Patient taking differently: Take 600 mg by mouth 2 (two) times daily.) 360 capsule 1   hydrALAZINE (APRESOLINE) 25 MG tablet Take 1.5 tablets (37.5 mg total) by mouth 3 (three) times daily. 405 tablet 1   HYDROcodone-acetaminophen (NORCO) 7.5-325 MG tablet  Take 1 tablet by mouth every 6 (six) hours as needed for moderate pain.     lisinopril (ZESTRIL) 40 MG tablet TAKE ONE TABLET BY MOUTH ONE TIME DAILY (Patient taking differently: Take 40 mg by mouth daily.) 90 tablet 3   methocarbamol (ROBAXIN) 750 MG tablet Take 750 mg by mouth 3 (three) times daily.     Omega-3 Fatty Acids (FISH  OIL PO) Take 1 Capful by mouth daily.     pomalidomide (POMALYST) 4 MG capsule Take 4 mg by mouth daily. Take for 21 days on, 7 days off. Repeat every 28 days.     potassium chloride SA (KLOR-CON M) 20 MEQ tablet Take 2 tablets (40 mEq total) by mouth daily. 10 tablet 0   sodium chloride (OCEAN) 0.65 % SOLN nasal spray Place 1 spray into both nostrils as needed for congestion.     traMADol (ULTRAM) 50 MG tablet Take 1 tablet (50 mg total) by mouth every 6 (six) hours as needed. (Patient taking differently: Take 50 mg by mouth every 6 (six) hours as needed for moderate pain.) 30 tablet 0   triamcinolone cream (KENALOG) 0.1 % Apply 1 application topically daily as needed (itching).     No current facility-administered medications for this encounter.    REVIEW OF SYSTEMS:  A 10+ POINT REVIEW OF SYSTEMS WAS OBTAINED including neurology, dermatology, psychiatry, cardiac, respiratory, lymph, extremities, GI, GU, musculoskeletal, constitutional, reproductive, HEENT. ***   PHYSICAL EXAM:  vitals were not taken for this visit.   General: Alert and oriented, in no acute distress HEENT: Head is normocephalic. Extraocular movements are intact. Oropharynx is clear. Neck: Neck is supple, no palpable cervical or supraclavicular lymphadenopathy. Heart: Regular in rate and rhythm with no murmurs, rubs, or gallops. Chest: Clear to auscultation bilaterally, with no rhonchi, wheezes, or rales. Abdomen: Soft, nontender, nondistended, with no rigidity or guarding. Extremities: No cyanosis or edema. Lymphatics: see Neck Exam Skin: No concerning lesions. Musculoskeletal: symmetric  strength and muscle tone throughout. Neurologic: Cranial nerves II through XII are grossly intact. No obvious focalities. Speech is fluent. Coordination is intact. Psychiatric: Judgment and insight are intact. Affect is appropriate. ***  ECOG = ***  0 - Asymptomatic (Fully active, able to carry on all predisease activities without restriction)  1 - Symptomatic but completely ambulatory (Restricted in physically strenuous activity but ambulatory and able to carry out work of a light or sedentary nature. For example, light housework, office work)  2 - Symptomatic, <50% in bed during the day (Ambulatory and capable of all self care but unable to carry out any work activities. Up and about more than 50% of waking hours)  3 - Symptomatic, >50% in bed, but not bedbound (Capable of only limited self-care, confined to bed or chair 50% or more of waking hours)  4 - Bedbound (Completely disabled. Cannot carry on any self-care. Totally confined to bed or chair)  5 - Death   Eustace Pen MM, Creech RH, Tormey DC, et al. [phone]). "Toxicity and response criteria of the [facility]". [place] Oncol. 5 (6): 649-55  LABORATORY DATA:  Lab Results  Component Value Date   WBC 8.1 06/23/2022   HGB 9.9 (L) 06/23/2022   HCT 29.6 (L) 06/23/2022   MCV 98.7 06/23/2022   PLT 178 06/23/2022   NEUTROABS 7.6 06/23/2022   Lab Results  Component Value Date   NA 137 06/23/2022   K 3.7 06/23/2022   CL 104 06/23/2022   CO2 23 06/23/2022   GLUCOSE 151 (H) 06/23/2022   BUN 15 06/23/2022   CREATININE 0.77 06/23/2022   CALCIUM 7.8 (L) 06/23/2022      RADIOGRAPHY: ECHOCARDIOGRAM COMPLETE  Result Date: 06/22/2022    ECHOCARDIOGRAM REPORT   Patient Name:   Travis [name] [facility] Date of Exam: 06/22/2022 Medical Rec #:  [id number]        Height:  70.0 in Accession #:    [id number]       Weight:       244.3 lb Date of Birth:  Jun 09, 1950       BSA:          2.272 m Patient Age:    71 years          BP:           132/74 mmHg Patient Gender: M                HR:           65 bpm. Exam Location:  Inpatient Procedure: 2D Echo, Color Doppler, Cardiac Doppler and Intracardiac            Opacification Agent Indications:    I48.91* Unspeicified atrial fibrillation  History:        Patient has prior history of Echocardiogram examinations, most                 recent 09/01/2021. Abnormal ECG, Arrythmias:Atrial Fibrillation,                 Signs/Symptoms:Chest Pain and Edema; Risk Factors:Hypertension                 and Sleep Apnea. Chemotherapy. Myeloma.  Sonographer:    Roseanna Rainbow RDCS Referring Phys: [id number] [facility]  Sonographer Comments: Technically difficult study due to poor echo windows and patient is obese. Image acquisition challenging due to patient body habitus. Patient in fowler's position. Patient post spinal surgery 2/2. IMPRESSIONS  1. Appears to be in sinus rhythm at the time of evaluation- A waves present on diastology. Left ventricular ejection fraction, by estimation, is 60 to 65%. The left ventricle has normal function. The left ventricle has no regional wall motion abnormalities. There is mild concentric left ventricular hypertrophy. Left ventricular diastolic parameters are consistent with Grade II diastolic dysfunction (pseudonormalization).  2. Right ventricular systolic function is normal. The right ventricular size is normal. There is normal pulmonary artery systolic pressure.  3. The mitral valve is grossly normal. No evidence of mitral valve regurgitation.  4. The aortic valve is tricuspid. Aortic valve regurgitation is not visualized. No aortic stenosis is present.  5. The inferior vena cava is normal in size with greater than 50% respiratory variability, suggesting right atrial pressure of 3 mmHg. FINDINGS  Left Ventricle: Appears to be in sinus rhythm at the time of evaluation- A waves present on diastology. Left ventricular ejection fraction, by estimation, is 60 to 65%. The left  ventricle has normal function. The left ventricle has no regional wall motion abnormalities. Definity contrast agent was given IV to delineate the left ventricular endocardial borders. The left ventricular internal cavity size was normal in size. There is mild concentric left ventricular hypertrophy. Left ventricular diastolic parameters are consistent with Grade II diastolic dysfunction (pseudonormalization). Right Ventricle: The right ventricular size is normal. No increase in right ventricular wall thickness. Right ventricular systolic function is normal. There is normal pulmonary artery systolic pressure. The tricuspid regurgitant velocity is 2.33 m/s, and  with an assumed right atrial pressure of 3 mmHg, the estimated right ventricular systolic pressure is [id number] mmHg. Left Atrium: Left atrial size was normal in size. Right Atrium: Right atrial size was normal in size. Pericardium: There is no evidence of pericardial effusion. Mitral Valve: The mitral valve is grossly normal. No evidence of mitral valve regurgitation. Tricuspid Valve: The tricuspid valve is normal in structure. Tricuspid  valve regurgitation is not demonstrated. No evidence of tricuspid stenosis. Aortic Valve: The aortic valve is tricuspid. Aortic valve regurgitation is not visualized. No aortic stenosis is present. Pulmonic Valve: The pulmonic valve was grossly normal. Pulmonic valve regurgitation is not visualized. Aorta: The aortic root and ascending aorta are structurally normal, with no evidence of dilitation. Venous: The inferior vena cava is normal in size with greater than 50% respiratory variability, suggesting right atrial pressure of 3 mmHg. IAS/Shunts: No atrial level shunt detected by color flow Doppler.  LEFT VENTRICLE PLAX 2D LVIDd:         5.20 cm   Diastology LVIDs:         3.30 cm   LV e' medial:    6.53 cm/s LV PW:         1.20 cm   LV E/e' medial:  15.5 LV IVS:        1.20 cm   LV e' lateral:   11.60 cm/s LVOT diam:     2.60 cm    LV E/e' lateral: 8.7 LV SV:         133 LV SV Index:   58 LVOT Area:     5.31 cm  RIGHT VENTRICLE             IVC RV S prime:     18.80 cm/s  IVC diam: 2.60 cm TAPSE (M-mode): 3.0 cm LEFT ATRIUM             Index        RIGHT ATRIUM           Index LA diam:        4.30 cm 1.89 cm/m   RA Area:     20.90 cm LA Vol (A2C):   84.9 ml 37.36 ml/m  RA Volume:   60.10 ml  26.45 ml/m LA Vol (A4C):   66.4 ml 29.22 ml/m LA Biplane Vol: 74.9 ml 32.96 ml/m  AORTIC VALVE LVOT Vmax:   127.00 cm/s LVOT Vmean:  84.000 cm/s LVOT VTI:    0.250 m  AORTA Ao Root diam: 3.70 cm Ao Asc diam:  3.70 cm MITRAL VALVE                TRICUSPID VALVE MV Area (PHT): 4.21 cm     TR Peak grad:   21.7 mmHg MV Decel Time: 180 msec     TR Vmax:        233.00 cm/s MV E velocity: 101.00 cm/s MV A velocity: 54.00 cm/s   SHUNTS MV E/A ratio:  1.87         Systemic VTI:  0.25 m                             Systemic Diam: 2.60 cm Rudean Haskell MD Electronically signed by Rudean Haskell MD Signature Date/Time: 06/22/2022/9:37:19 AM    Final    DG Thoracic Spine 2 View  Result Date: 06/21/2022 CLINICAL DATA:  Trauma EXAM: THORACIC SPINE 2 VIEWS COMPARISON:  Cone beam CT 06/04/22 FINDINGS: Assessment is limited due to patient positioning. Post surgical changes from fusion at the cervicothoracic junction. Hardware is intact with unchanged alignment compared to immediate post-operative cone-beam CT. There is no evidence of thoracic spine fracture. No other significant bone abnormalities are identified. IMPRESSION: Limited assessment due to patient positioning. Within this limitation - no evidence of thoracic spine fracture. If there is high clinical concern fro a  cervical or thoracic spine injury, further evaluation with CT is recommended. Electronically Signed   By: Marin Roberts M.D.   On: 06/21/2022 10:55   VAS [place] LOWER EXTREMITY VENOUS (DVT) (7a-7p)  Result Date: 06/21/2022  Lower Venous DVT Study Patient Name:  Travis [name] [facility]   Date of Exam:   06/21/2022 Medical Rec #: [id number]         Accession #:    [id number] Date of Birth: April 26, 1951        Patient Gender: M Patient Age:   76 years Exam Location:  [facility] Procedure:      VAS [place] LOWER EXTREMITY VENOUS (DVT) Referring Phys: Wille Glaser KNAPP --------------------------------------------------------------------------------  Indications: Asymmetrical swelling x2 weeks LT>RT, s/p thoracic spine surgery 06/04/2022.  Risk Factors: History of DVT. Anticoagulation: Eliquis. Limitations: Poor ultrasound/tissue interface. Comparison Study: No prior studies available. Performing Technologist: Darlin Coco RDMS, RVT  Examination Guidelines: A complete evaluation includes B-mode imaging, spectral Doppler, color Doppler, and power Doppler as needed of all accessible portions of each vessel. Bilateral testing is considered an integral part of a complete examination. Limited examinations for reoccurring indications may be performed as noted. The reflux portion of the exam is performed with the patient in reverse Trendelenburg.  +-----+---------------+---------+-----------+----------+--------------+ RIGHTCompressibilityPhasicitySpontaneityPropertiesThrombus Aging +-----+---------------+---------+-----------+----------+--------------+ CFV  Full           Yes      Yes                                 +-----+---------------+---------+-----------+----------+--------------+   +---------+---------------+---------+-----------+----------+--------------+ LEFT     CompressibilityPhasicitySpontaneityPropertiesThrombus Aging +---------+---------------+---------+-----------+----------+--------------+ CFV      Full           Yes      Yes                                 +---------+---------------+---------+-----------+----------+--------------+ SFJ      Full                                                         +---------+---------------+---------+-----------+----------+--------------+ FV Prox  Full                                                        +---------+---------------+---------+-----------+----------+--------------+ FV Mid   Full                                                        +---------+---------------+---------+-----------+----------+--------------+ FV DistalFull                                                        +---------+---------------+---------+-----------+----------+--------------+ PFV      Full                                                        +---------+---------------+---------+-----------+----------+--------------+  POP      Full           Yes      Yes                                 +---------+---------------+---------+-----------+----------+--------------+ PTV      Full                                                        +---------+---------------+---------+-----------+----------+--------------+ PERO     Partial        No       No                   Acute          +---------+---------------+---------+-----------+----------+--------------+     Summary: RIGHT: - No evidence of common femoral vein obstruction.  LEFT: - Findings consistent with acute deep vein thrombosis involving the left peroneal veins. - No cystic structure found in the popliteal fossa.  *See table(s) above for measurements and observations. Electronically signed by Servando Snare MD on 06/21/2022 at 10:46:28 AM.    Final    DG Chest 2 View  Result Date: 06/21/2022 CLINICAL DATA:  Weakness EXAM: CHEST - 2 VIEW COMPARISON:  CXR 03/10/22 FINDINGS: Assessment of the bilateral lung apices limited due to patient positioning/chin down technique. No pleural effusion. No pneumothorax. No focal airspace opacity. Unchanged cardiac and mediastinal contours. Visualized upper abdomen is unremarkable. Possible age indeterminate fracture of the anterior/lateral left sixth rib.  Visualized upper abdomen is unremarkable. IMPRESSION: 1. Possible age indeterminate fracture of the anterolateral left sixth rib. Correlate with point tenderness. 2. No focal airspace opacity. Electronically Signed   By: Marin Roberts M.D.   On: 06/21/2022 08:38   DG Cervical Spine 2 or 3 views  Result Date: 06/04/2022 CLINICAL DATA:  Elective surgery EXAM: CERVICAL SPINE - 2-3 VIEW COMPARISON:  MRI cervical spine 05/27/2022. Cervical spine x-ray 05/13/2022 FINDINGS: Two views of the cervical spine were submitted, intraoperative. No hardware identified. Fluoroscopy time 15 seconds. Dose: 1.76 micro Gy. IMPRESSION: Intraoperative cervical spine as above. Electronically Signed   By: Ronney Asters M.D.   On: 06/04/2022 15:25   DG C-Arm 1-60 Min-No Report  Result Date: 06/04/2022 Fluoroscopy was utilized by the requesting physician.  No radiographic interpretation.   DG O-ARM IMAGE ONLY/NO REPORT  Result Date: 06/04/2022 There is no Radiologist interpretation  for this exam.  CT THORACIC SPINE W CONTRAST  Result Date: 06/04/2022 CLINICAL DATA:  Metastatic disease.  Preoperative assessment EXAM: CT THORACIC SPINE WITH CONTRAST TECHNIQUE: Multidetector CT images of thoracic was performed according to the standard protocol following intravenous contrast administration. RADIATION DOSE REDUCTION: This exam was performed according to the departmental dose-optimization program which includes automated exposure control, adjustment of the mA and/or kV according to patient size and/or use of iterative reconstruction technique. CONTRAST:  4m OMNIPAQUE IOHEXOL 350 MG/ML SOLN COMPARISON:  05/29/2022 FINDINGS: Alignment: Exaggerated thoracic kyphosis. Vertebrae: Suboptimal coverage especially considering level of primary concern. Partially covered T2 vertebra with known tumoral erosion of the vertebral body with minimal remaining inferior endplate visible. Compression fracture of this vertebra with advanced narrowing.  Diffuse heterogeneity of marrow with tumor extent better assessed on prior MRI, most notable deposits in the  T8 body, T11 spinous process, and L1 left pedicle. Additional rib lesions on the right at T6 and T8 primarily. Paraspinal and other soft tissues: Paravertebral tumor eccentric to the left at T2. Disc levels: Ordinary thoracic spondylosis. Request was made for additional imaging but declined due to pending surgery IMPRESSION: 1. Suboptimal exam with only partial coverage of the T2 level where there is extensive tumor replacement of the vertebral body with collapse and paravertebral/spinal canal mass. 2. Additional lesions also seen on recent MRI. Electronically Signed   By: Jorje Guild M.D.   On: 06/04/2022 07:44   MR LUMBAR SPINE W WO CONTRAST  Result Date: 05/29/2022 CLINICAL DATA:  72 year old male with multiple myeloma. 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 EXAM: MRI LUMBAR SPINE WITHOUT AND WITH CONTRAST TECHNIQUE: Multiplanar and multiecho pulse sequences of the lumbar spine were obtained without and with intravenous contrast. CONTRAST:  92m GADAVIST GADOBUTROL 1 MMOL/ML IV SOLN thoracic in conjunction with contrast enhanced imaging of the chest, abdomen, and pelvis reported separately. COMPARISON:  Thoracic MRI today reported separately. Prior lumbar MRI 06/25/2019. FINDINGS: Segmentation: Normal, concordant with the thoracic numbering today and the same designation used in 2021. Alignment:  Stable lumbar vertebral height and alignment since 2021. Vertebrae: Confluent up to 2.3 cm tumor in the left L1 posterior elements including the pedicle (series 11, image 7). Small 8 mm tumor in the inferior L2 body. And larger 2 cm tumor in the left L2 pedicle. Subcentimeter tumor inferior L3 vertebral body. Larger 10 mm tumor posterior L4 vertebral body. L5 small 9 mm and smaller tumor in the right L5 body. Scattered  subcentimeter left sacral ala tumor. These lesions are new since 2021. No lumbar or visible sacral pathologic fracture. No extraosseous or epidural tumor identified. Conus medullaris and cauda equina: Conus extends to the L1 level. No lower spinal cord or conus signal abnormality. No abnormal intradural enhancement. No dural thickening identified. Paraspinal and other soft tissues: Negative for age visible abdominal viscera. Negative lumbar paraspinal soft tissues. Disc levels: Lumbar spine degeneration not significantly changed since 2021 Aside from new moderate size central disc protrusion at L3-L4 (series 11, image 22) now resulting in mild to moderate spinal stenosis at that level. IMPRESSION: 1. Generally small Multiple Myeloma lesions scattered at all lumbar levels and in the visible left sacral ala. Largest lumbar tumor is 2.3 cm in the left L1 posterior elements and pedicle. No lumbar extraosseous or epidural tumor extension, or pathologic fracture. 2. 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 Electronically Signed   By: HGenevie AnnM.D.   On: 05/29/2022 12:22   MR THORACIC SPINE W WO CONTRAST  Result Date: 05/29/2022 CLINICAL DATA:  72year old male with multiple myeloma. 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 EXAM: MRI THORACIC WITHOUT AND WITH CONTRAST TECHNIQUE: Multiplanar and multiecho pulse sequences of the thoracic spine were obtained without and with intravenous contrast. CONTRAST:  179mGADAVIST GADOBUTROL 1 MMOL/ML IV SOLN COMPARISON:  Cervical spine MRI without and with contrast 05/27/2022. FINDINGS: Limited cervical spine imaging: Stable compared to the recent 05/27/2022 exam. Thoracic spine segmentation:  Appears to be normal. Alignment: Exaggerated upper thoracic kyphosis and part related  to the T2 pathologic lesion. Vertebrae: T1 better demonstrated on the recent cervical comparison. Highly abnormal  T2 level with retropulsed and epidural tumor. Subsequent cord compression which does not appear significantly changed since 05/27/2022 (series 22, image 9, series 23, images 7 and 8). No definite cord edema or myelomalacia. Bulky ventral and left lateral extraosseous tumor extension as previously noted. Tumor in the T2 lamina and spinous process also. See series 26, image 9). Mild right T3 posteroinferior vertebral body tumor tracking toward the pedicle. No pathologic fracture. No tumor identified at T4, T5. However, there is posterior right 5th rib tumor on series 26, image 26. Similar small 5 mm enhancing tumor in the left T6 inferior body, endplate. No tumor identified at T7. However, there is posterior right 7th rib tumor (series 27, image 5). T8 12 mm tumor in the anterior body on the right side. No pathologic fracture. No T9 tumor. T10 inferior endplate 12 mm tumor with subtle associated inferior endplate compression. T11 bulky 18 mm midline lamina and spinous process tumor, but no extraosseous extension. No T12 tumor. Lumbar spine detailed separately. Cord: Normal spinal canal patency and thoracic cord below the T2-T3 disc level. Cord signal and morphology remains normal to the conus at L1. No abnormal intradural enhancement. No definite dural thickening. Paraspinal and other soft tissues: T2 paraspinal tumor eccentric to the left as above. Small layering pleural effusions. Negative visible other chest and upper abdominal viscera. Disc levels: Mild for age underlying thoracic spine degeneration. No degenerative spinal stenosis. IMPRESSION: 1. 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 No change compared to 05/27/2022. No cord edema or myelomalacia identified. 2. Small tumor foci at  most other thoracic levels, with no other thoracic extraosseous or epidural tumor extension. Posterior rib involvement intermittently noted. 3. Small layering pleural effusions. 4. Lumbar MRI today is reported separately. Electronically Signed   By: Genevie Ann M.D.   On: 05/29/2022 12:11   MR CERVICAL SPINE W WO CONTRAST  Addendum Date: 05/28/2022   ADDENDUM REPORT: 05/28/2022 16:46 ADDENDUM: Critical Value/emergent results were called by telephone at the time of interpretation on 05/28/2022 at 4:40 pm to provider on-call Heath Lark, who verbally acknowledged these results. Electronically Signed   By: Richardean Sale M.D.   On: 05/28/2022 16:46   Result Date: 05/28/2022 CLINICAL DATA:  Multiple myeloma. Increasing neck pain/tenderness with limited range of motion. EXAM: MRI CERVICAL SPINE WITHOUT AND WITH CONTRAST TECHNIQUE: Multiplanar and multiecho pulse sequences of the cervical spine, to include the craniocervical junction and cervicothoracic junction, were obtained without and with intravenous contrast. CONTRAST:  65m GADAVIST GADOBUTROL 1 MMOL/ML IV SOLN COMPARISON:  Radiographs of the cervical and thoracic spine 05/13/2022. Chest CTA 03/15/2022. Cervical MRI 03/06/2020. FINDINGS: Alignment: The alignment of the cervical spine is anatomic, although progressive kyphosis has developed at T2 secondary to a pathologic fracture. Vertebrae: As demonstrated on previous chest CTA, there are lytic lesions in the thoracic spine with associated enhancement following contrast. A large lesion at T2 has progressed and is associated with a new marked pathologic fracture with osseous retropulsion by 6 mm. Resulting cord compression. There is near complete replacement of the T2 vertebral body by tumor, measuring up to 4.8 x 4.0 cm transverse on axial image 47/12. There are additional smaller myelomatous lesions within the T1 vertebral body, within the right C7 and right T3 articulating facets. No other pathologic fractures  are identified. Cord: The cervical cord is normal in signal and caliber. There is posterior displacement and mild compression of the  thoracic cord at T2 by the pathologic fracture described above. Posterior Fossa, vertebral arteries, paraspinal tissues: Linear enhancement within the right cerebellar hemisphere is likely due to an incidental venous angioma. No associated significant linear T2 hyperintensity. The visualized posterior fossa is otherwise unremarkable.Bilateral vertebral artery flow voids. No significant paraspinal findings. Disc levels: C2-3: Bilateral facet hypertrophy contributes to chronic left foraminal narrowing. No cord deformity. C3-4: The disc appears normal. The left facet joint appears ankylosed. Chronic moderate left foraminal narrowing. C4-5: Chronic disc bulging with asymmetric uncinate spurring on the right and bilateral facet hypertrophy. Stable mild spinal stenosis without cord deformity. Moderate right-greater-than-left foraminal narrowing is similar to the previous study. C5-6: Chronic spondylosis with posterior osteophytes covering diffusely bulging disc material. Mild facet hypertrophy. Stable mild spinal stenosis and mild foraminal narrowing bilaterally. C6-7: Chronic spondylosis with posterior osteophytes covering diffusely bulging disc material. Mild bilateral facet hypertrophy. Mild foraminal narrowing bilaterally. C7-T1: Normal interspace. As above, pathologic fracture at T2 with significant osseous retropulsion and resulting cord compression. There is moderate left greater than right foraminal narrowing at the T1-2 and T2-3 disc space levels. IMPRESSION: 1. Interval progression of multiple myeloma in the thoracic spine compared with previous chest CT of 10 weeks ago. There is a new pathologic fracture at T2 with significant osseous retropulsion and resulting cord compression. No abnormal cord signal identified. 2. Additional smaller lesions within the T1 vertebral body and  right C7 and T3 articulating facets. No other pathologic fractures identified. 3. Mild multilevel cervical spondylosis with resulting mild spinal stenosis and mild to moderate foraminal narrowing at multiple levels as described, similar to previous MRI. No cord compression in the cervical spine. Electronically Signed: By: Richardean Sale M.D. On: 05/28/2022 16:11      IMPRESSION: Multiple myeloma with lytic lesions in the right humerus, right ulna, right 6th rib and thoracic spine    ***  Today, I talked to the patient and family about the findings and work-up thus far.  We discussed the natural history of *** and general treatment, highlighting the role of radiotherapy in the management.  We discussed the available radiation techniques, and focused on the details of logistics and delivery.  We reviewed the anticipated acute and late sequelae associated with radiation in this setting.  The patient was encouraged to ask questions that I answered to the best of my ability. *** A patient consent form was discussed and signed.  We retained a copy for our records.  The patient would like to proceed with radiation and will be scheduled for CT simulation.  PLAN: ***    *** minutes of total time was spent for this patient encounter, including preparation, face-to-face counseling with the patient and coordination of care, physical exam, and documentation of the encounter.   ------------------------------------------------  Blair Promise, PhD, MD  This document serves as a record of services personally performed by Gery Pray, MD. It was created on his behalf by Roney Mans, a trained medical scribe. The creation of this record is based on the scribe's personal observations and the provider's statements to them. This document has been checked and approved by the attending provider.

## 2022-06-27 ENCOUNTER — Telehealth: Payer: Self-pay | Admitting: Internal Medicine

## 2022-06-27 NOTE — Telephone Encounter (Signed)
Pt called   Said his HR was 150  IT was 140s earlier this week  He is currently uncomfortable  Not dizzy      He is on 240 mg Dilt and 200 mg amiodarone  I recomm: Hold off taking lisinopril and hydralazine  He has 100 Toprol XL at home   I would recomm he take Take diltiazem Increase amiodarone to 400 mg     Keep track of BP  / HR  Will call in metoprolol 25 mg to take as needed for elevated HR  IF HR does not go down or if he gets dizzy should go to ER.

## 2022-06-28 ENCOUNTER — Encounter: Payer: Self-pay | Admitting: Radiation Oncology

## 2022-06-28 ENCOUNTER — Ambulatory Visit
Admission: RE | Admit: 2022-06-28 | Discharge: 2022-06-28 | Disposition: A | Discharge status: Home / Self Care | Payer: Medicare Other | Source: Ambulatory Visit | Attending: Radiation Oncology | Admitting: Radiation Oncology

## 2022-06-28 ENCOUNTER — Ambulatory Visit: Payer: Medicare Other | Admitting: Family Medicine

## 2022-06-28 VITALS — BP 130/65 | HR 86 | Temp 97.5°F | Resp 20 | Ht 70.0 in | Wt 241.6 lb

## 2022-06-28 DIAGNOSIS — Z7952 Long term (current) use of systemic steroids: Secondary | ICD-10-CM | POA: Insufficient documentation

## 2022-06-28 DIAGNOSIS — Z7961 Long term (current) use of immunomodulator: Secondary | ICD-10-CM | POA: Insufficient documentation

## 2022-06-28 DIAGNOSIS — Z79899 Other long term (current) drug therapy: Secondary | ICD-10-CM | POA: Insufficient documentation

## 2022-06-28 DIAGNOSIS — Z86718 Personal history of other venous thrombosis and embolism: Secondary | ICD-10-CM | POA: Insufficient documentation

## 2022-06-28 DIAGNOSIS — M47814 Spondylosis without myelopathy or radiculopathy, thoracic region: Secondary | ICD-10-CM | POA: Insufficient documentation

## 2022-06-28 DIAGNOSIS — C9000 Multiple myeloma not having achieved remission: Secondary | ICD-10-CM

## 2022-06-28 DIAGNOSIS — I480 Paroxysmal atrial fibrillation: Secondary | ICD-10-CM | POA: Insufficient documentation

## 2022-06-28 DIAGNOSIS — I4891 Unspecified atrial fibrillation: Secondary | ICD-10-CM | POA: Insufficient documentation

## 2022-06-28 DIAGNOSIS — Z7901 Long term (current) use of anticoagulants: Secondary | ICD-10-CM | POA: Insufficient documentation

## 2022-06-28 DIAGNOSIS — M4854XA Collapsed vertebra, not elsewhere classified, thoracic region, initial encounter for fracture: Secondary | ICD-10-CM | POA: Insufficient documentation

## 2022-06-28 DIAGNOSIS — Z79624 Long term (current) use of inhibitors of nucleotide synthesis: Secondary | ICD-10-CM | POA: Insufficient documentation

## 2022-06-28 DIAGNOSIS — K219 Gastro-esophageal reflux disease without esophagitis: Secondary | ICD-10-CM | POA: Insufficient documentation

## 2022-06-28 DIAGNOSIS — I10 Essential (primary) hypertension: Secondary | ICD-10-CM | POA: Insufficient documentation

## 2022-06-28 DIAGNOSIS — J1289 Other viral pneumonia: Secondary | ICD-10-CM | POA: Diagnosis not present

## 2022-06-28 DIAGNOSIS — R0602 Shortness of breath: Secondary | ICD-10-CM | POA: Diagnosis not present

## 2022-06-28 HISTORY — DX: Unspecified atrial fibrillation: I48.91

## 2022-06-28 LAB — MULTIPLE MYELOMA PANEL, SERUM
Albumin SerPl Elph-Mcnc: 2.9 g/dL (ref 2.9–4.4)
Albumin/Glob SerPl: 1.3 (ref 0.7–1.7)
Alpha 1: 0.4 g/dL (ref 0.0–0.4)
Alpha2 Glob SerPl Elph-Mcnc: 0.9 g/dL (ref 0.4–1.0)
B-Globulin SerPl Elph-Mcnc: 0.8 g/dL (ref 0.7–1.3)
Gamma Glob SerPl Elph-Mcnc: 0.2 g/dL — ABNORMAL LOW (ref 0.4–1.8)
Globulin, Total: 2.3 g/dL (ref 2.2–3.9)
IgA: 35 mg/dL — ABNORMAL LOW (ref 61–437)
IgG (Immunoglobin G), Serum: 149 mg/dL — ABNORMAL LOW (ref 603–1613)
IgM (Immunoglobulin M), Srm: <5 mg/dL — ABNORMAL LOW (ref 15–143)
Total Protein ELP: 5.2 g/dL — ABNORMAL LOW (ref 6.0–8.5)

## 2022-06-28 NOTE — Progress Notes (Signed)
Histology and Location of Primary Cancer:  Refractory multiple myeloma   Sites of Visceral and Bony Metastatic Disease:  CT Thoracic Spine w/ Contrast 06/03/2022 --IMPRESSION: Suboptimal exam with only partial coverage of the T2 level where there is extensive tumor replacement of the vertebral body with collapse and paravertebral/spinal canal mass. Additional lesions also seen on recent MRI.  MRI Spine w/ & w/o Contrast 05/29/2022 Lumbar --IMPRESSION: Generally small Multiple Myeloma lesions scattered at all lumbar levels and in the visible left sacral ala. Largest lumbar tumor is 2.3 cm in the left L1 posterior elements and pedicle. No lumbar extraosseous or epidural tumor extension, or pathologic fracture. 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 No change compared to 05/27/2022. No cord edema or myelomalacia identified. Small tumor foci at most other thoracic levels, with no other thoracic extraosseous or epidural tumor extension. Posterior rib involvement intermittently noted. Small layering pleural effusions. Lumbar MRI today is reported separately. Cervical --IMPRESSION: Interval progression of multiple myeloma in the thoracic spine compared with previous chest CT of 10 weeks ago. There is a new pathologic fracture at T2 with significant osseous retropulsion and resulting cord compression. No abnormal cord signal identified. Additional smaller lesions within the T1 vertebral body and right C7 and T3 articulating facets. No other pathologic fractures identified. Mild multilevel cervical spondylosis with resulting mild spinal stenosis and mild to moderate foraminal narrowing at  multiple levels as described, similar to previous MRI. No cord compression in the cervical spine.  Past or anticipated interventions, if any, per neurosurgery:  06/04/2022 --Dr. Ashok Pall Thoracic one - Thoracic Four Posterior lateral arthrodesis/ fusion and Thoracic two Corpectomy for tumor resection, extradural. Interbody Sabre cage placed, allograft morsels in the corpectomy site Application of O-Arm Segmental pedicle screw fixation T1-T4 Globus  Past/Anticipated chemotherapy by medical oncology, if any:  Under care of Dr. Narda Rutherford 06/07/2022 --Refractory Multiple Myeloma Based on this spinal lesion and progression of serum proteins will need to change chemotherapy regimens.   Currently on Dara/rev/Dex chemotherapy will need to transition to another line of therapy,  anticipate a Velcade based regimen.   Will discuss further in the outpatient setting once patient has recovered from his surgery  Pain on a scale of 0-10 is:  6 Back    If Spine Met(s), symptoms, if any, include: Bowel/Bladder retention or incontinence (please describe):  no issues Numbness or weakness in extremities (please describe): no issues Current Decadron regimen, if applicable:40 mg on chemotherapy day  Ambulatory status? Walker? Wheelchair?: Patient uses a walker.  SAFETY ISSUES: Prior radiation? Yes 05/29/2022:  Thoracic Spine   01/06/2022 through 01/19/2022 Site Technique Total Dose (Gy) Dose per Fx (Gy) Completed Fx Beam Energies  Humerus, Right: Ext_R_Humerus Complex 20/20 2 10/10 6X, 10X  Arm, Right: Ext_R_Forearm Complex 20/20 2 10/10 6X  Chest, Right: Chest_R_Rib Complex 20/20 2 10/10 10X   Pacemaker/ICD? No Possible current pregnancy? N/A Is the patient on methotrexate? No  Current Complaints / other details:   Vitals:   06/28/22 1307  BP: 130/65  Pulse: 86  Resp: 20  Temp: (!) 97.5 F (36.4 C)  SpO2: 94%  Weight: 109.6 kg  Height: '5\' 10"'$  (1.778 m)

## 2022-06-29 ENCOUNTER — Inpatient Hospital Stay (HOSPITAL_COMMUNITY)
Admission: EM | Admit: 2022-06-29 | Discharge: 2022-07-06 | DRG: 193 | Disposition: A | Discharge status: Home Health | Payer: Medicare Other | Attending: Internal Medicine | Admitting: Internal Medicine

## 2022-06-29 ENCOUNTER — Inpatient Hospital Stay: Payer: Medicare Other

## 2022-06-29 ENCOUNTER — Ambulatory Visit: Payer: Medicare Other | Admitting: Radiation Oncology

## 2022-06-29 ENCOUNTER — Encounter (HOSPITAL_COMMUNITY): Payer: Self-pay | Admitting: Family Medicine

## 2022-06-29 ENCOUNTER — Emergency Department (HOSPITAL_COMMUNITY): Payer: Medicare Other

## 2022-06-29 ENCOUNTER — Other Ambulatory Visit: Payer: Self-pay

## 2022-06-29 ENCOUNTER — Inpatient Hospital Stay: Payer: Medicare Other | Admitting: Hematology and Oncology

## 2022-06-29 DIAGNOSIS — Z888 Allergy status to other drugs, medicaments and biological substances status: Secondary | ICD-10-CM

## 2022-06-29 DIAGNOSIS — J1289 Other viral pneumonia: Principal | ICD-10-CM | POA: Diagnosis present

## 2022-06-29 DIAGNOSIS — B9729 Other coronavirus as the cause of diseases classified elsewhere: Secondary | ICD-10-CM | POA: Diagnosis present

## 2022-06-29 DIAGNOSIS — I10 Essential (primary) hypertension: Secondary | ICD-10-CM | POA: Diagnosis present

## 2022-06-29 DIAGNOSIS — J189 Pneumonia, unspecified organism: Secondary | ICD-10-CM | POA: Insufficient documentation

## 2022-06-29 DIAGNOSIS — C9000 Multiple myeloma not having achieved remission: Secondary | ICD-10-CM | POA: Diagnosis present

## 2022-06-29 DIAGNOSIS — I4891 Unspecified atrial fibrillation: Secondary | ICD-10-CM | POA: Diagnosis not present

## 2022-06-29 DIAGNOSIS — E876 Hypokalemia: Secondary | ICD-10-CM | POA: Diagnosis present

## 2022-06-29 DIAGNOSIS — Z1152 Encounter for screening for COVID-19: Secondary | ICD-10-CM

## 2022-06-29 DIAGNOSIS — G9529 Other cord compression: Secondary | ICD-10-CM | POA: Diagnosis present

## 2022-06-29 DIAGNOSIS — R739 Hyperglycemia, unspecified: Secondary | ICD-10-CM | POA: Diagnosis not present

## 2022-06-29 DIAGNOSIS — Z79624 Long term (current) use of inhibitors of nucleotide synthesis: Secondary | ICD-10-CM

## 2022-06-29 DIAGNOSIS — Z9889 Other specified postprocedural states: Secondary | ICD-10-CM

## 2022-06-29 DIAGNOSIS — E785 Hyperlipidemia, unspecified: Secondary | ICD-10-CM | POA: Diagnosis present

## 2022-06-29 DIAGNOSIS — G4733 Obstructive sleep apnea (adult) (pediatric): Secondary | ICD-10-CM | POA: Diagnosis present

## 2022-06-29 DIAGNOSIS — I5033 Acute on chronic diastolic (congestive) heart failure: Secondary | ICD-10-CM | POA: Diagnosis not present

## 2022-06-29 DIAGNOSIS — Z8249 Family history of ischemic heart disease and other diseases of the circulatory system: Secondary | ICD-10-CM

## 2022-06-29 DIAGNOSIS — D630 Anemia in neoplastic disease: Secondary | ICD-10-CM | POA: Diagnosis present

## 2022-06-29 DIAGNOSIS — J9601 Acute respiratory failure with hypoxia: Secondary | ICD-10-CM

## 2022-06-29 DIAGNOSIS — I110 Hypertensive heart disease with heart failure: Secondary | ICD-10-CM | POA: Diagnosis present

## 2022-06-29 DIAGNOSIS — E871 Hypo-osmolality and hyponatremia: Secondary | ICD-10-CM

## 2022-06-29 DIAGNOSIS — Z981 Arthrodesis status: Secondary | ICD-10-CM

## 2022-06-29 DIAGNOSIS — E861 Hypovolemia: Secondary | ICD-10-CM | POA: Diagnosis present

## 2022-06-29 DIAGNOSIS — D472 Monoclonal gammopathy: Secondary | ICD-10-CM | POA: Diagnosis present

## 2022-06-29 DIAGNOSIS — Z823 Family history of stroke: Secondary | ICD-10-CM

## 2022-06-29 DIAGNOSIS — C799 Secondary malignant neoplasm of unspecified site: Secondary | ICD-10-CM | POA: Diagnosis present

## 2022-06-29 DIAGNOSIS — I4819 Other persistent atrial fibrillation: Secondary | ICD-10-CM | POA: Diagnosis present

## 2022-06-29 DIAGNOSIS — E669 Obesity, unspecified: Secondary | ICD-10-CM | POA: Diagnosis present

## 2022-06-29 DIAGNOSIS — R0902 Hypoxemia: Secondary | ICD-10-CM

## 2022-06-29 DIAGNOSIS — Z6835 Body mass index (BMI) 35.0-35.9, adult: Secondary | ICD-10-CM

## 2022-06-29 DIAGNOSIS — R918 Other nonspecific abnormal finding of lung field: Secondary | ICD-10-CM | POA: Diagnosis not present

## 2022-06-29 DIAGNOSIS — Z818 Family history of other mental and behavioral disorders: Secondary | ICD-10-CM

## 2022-06-29 DIAGNOSIS — R0602 Shortness of breath: Secondary | ICD-10-CM | POA: Diagnosis present

## 2022-06-29 DIAGNOSIS — T380X5A Adverse effect of glucocorticoids and synthetic analogues, initial encounter: Secondary | ICD-10-CM | POA: Diagnosis not present

## 2022-06-29 DIAGNOSIS — Z86718 Personal history of other venous thrombosis and embolism: Secondary | ICD-10-CM

## 2022-06-29 DIAGNOSIS — Z7961 Long term (current) use of immunomodulator: Secondary | ICD-10-CM

## 2022-06-29 DIAGNOSIS — Z881 Allergy status to other antibiotic agents status: Secondary | ICD-10-CM | POA: Diagnosis not present

## 2022-06-29 DIAGNOSIS — Z79899 Other long term (current) drug therapy: Secondary | ICD-10-CM | POA: Diagnosis not present

## 2022-06-29 DIAGNOSIS — I480 Paroxysmal atrial fibrillation: Secondary | ICD-10-CM | POA: Diagnosis not present

## 2022-06-29 DIAGNOSIS — Z96641 Presence of right artificial hip joint: Secondary | ICD-10-CM | POA: Diagnosis present

## 2022-06-29 DIAGNOSIS — Z7901 Long term (current) use of anticoagulants: Secondary | ICD-10-CM

## 2022-06-29 LAB — CBC
HCT: 29.8 % — ABNORMAL LOW (ref 39.0–52.0)
Hemoglobin: 9.7 g/dL — ABNORMAL LOW (ref 13.0–17.0)
MCH: 31.9 pg (ref 26.0–34.0)
MCHC: 32.6 g/dL (ref 30.0–36.0)
MCV: 98 fL (ref 80.0–100.0)
Platelets: 231 10*3/uL (ref 150–400)
RBC: 3.04 MIL/uL — ABNORMAL LOW (ref 4.22–5.81)
RDW: 16.3 % — ABNORMAL HIGH (ref 11.5–15.5)
WBC: 9 10*3/uL (ref 4.0–10.5)
nRBC: 0 % (ref 0.0–0.2)

## 2022-06-29 LAB — COMPREHENSIVE METABOLIC PANEL
ALT: 24 U/L (ref 0–44)
AST: 19 U/L (ref 15–41)
Albumin: 2.4 g/dL — ABNORMAL LOW (ref 3.5–5.0)
Alkaline Phosphatase: 78 U/L (ref 38–126)
Anion gap: 7 (ref 5–15)
BUN: 17 mg/dL (ref 8–23)
CO2: 24 mmol/L (ref 22–32)
Calcium: 8 mg/dL — ABNORMAL LOW (ref 8.9–10.3)
Chloride: 102 mmol/L (ref 98–111)
Creatinine, Ser: 0.79 mg/dL (ref 0.61–1.24)
GFR, Estimated: >60 mL/min (ref >60)
Glucose, Bld: 174 mg/dL — ABNORMAL HIGH (ref 70–99)
Potassium: 3.2 mmol/L — ABNORMAL LOW (ref 3.5–5.1)
Sodium: 133 mmol/L — ABNORMAL LOW (ref 135–145)
Total Bilirubin: 0.7 mg/dL (ref 0.3–1.2)
Total Protein: 5.2 g/dL — ABNORMAL LOW (ref 6.5–8.1)

## 2022-06-29 LAB — RESP PANEL BY RT-PCR (RSV, FLU A&B, COVID)  RVPGX2
Influenza A by PCR: NEGATIVE
Influenza B by PCR: NEGATIVE
Resp Syncytial Virus by PCR: NEGATIVE
SARS Coronavirus 2 by RT PCR: NEGATIVE

## 2022-06-29 LAB — BRAIN NATRIURETIC PEPTIDE: B Natriuretic Peptide: 785.8 pg/mL — ABNORMAL HIGH (ref 0.0–100.0)

## 2022-06-29 MED ORDER — POTASSIUM CHLORIDE CRYS ER 20 MEQ PO TBCR
40.0000 meq | EXTENDED_RELEASE_TABLET | Freq: Two times a day (BID) | ORAL | Status: AC
  Administered 2022-06-29: 40 meq via ORAL
  Filled 2022-06-29: qty 2

## 2022-06-29 MED ORDER — FUROSEMIDE 10 MG/ML IJ SOLN
20.0000 mg | Freq: Once | INTRAMUSCULAR | Status: AC
  Administered 2022-06-29: 20 mg via INTRAVENOUS
  Filled 2022-06-29: qty 4

## 2022-06-29 MED ORDER — APIXABAN 5 MG PO TABS
10.0000 mg | ORAL_TABLET | Freq: Once | ORAL | Status: AC
  Administered 2022-06-29: 10 mg via ORAL
  Filled 2022-06-29: qty 2

## 2022-06-29 MED ORDER — IOHEXOL 350 MG/ML SOLN
80.0000 mL | Freq: Once | INTRAVENOUS | Status: AC | PRN
  Administered 2022-06-29: 80 mL via INTRAVENOUS

## 2022-06-29 MED ORDER — SODIUM CHLORIDE 0.9 % IV SOLN
500.0000 mg | INTRAVENOUS | Status: DC
  Administered 2022-06-30: 500 mg via INTRAVENOUS
  Filled 2022-06-29: qty 5

## 2022-06-29 MED ORDER — SODIUM CHLORIDE 0.9 % IV SOLN
1.0000 g | Freq: Once | INTRAVENOUS | Status: AC
  Administered 2022-06-29: 1 g via INTRAVENOUS
  Filled 2022-06-29: qty 10

## 2022-06-29 MED ORDER — DILTIAZEM HCL-DEXTROSE 125-5 MG/125ML-% IV SOLN (PREMIX)
5.0000 mg/h | INTRAVENOUS | Status: DC
  Administered 2022-06-29: 5 mg/h via INTRAVENOUS
  Administered 2022-06-30 – 2022-07-02 (×5): 15 mg/h via INTRAVENOUS
  Filled 2022-06-29 (×8): qty 125

## 2022-06-29 MED ORDER — METOPROLOL TARTRATE 5 MG/5ML IV SOLN
5.0000 mg | Freq: Once | INTRAVENOUS | Status: AC
  Administered 2022-06-29: 5 mg via INTRAVENOUS
  Filled 2022-06-29: qty 5

## 2022-06-29 MED ORDER — APIXABAN 5 MG PO TABS
5.0000 mg | ORAL_TABLET | Freq: Two times a day (BID) | ORAL | Status: DC
  Administered 2022-06-30 – 2022-07-06 (×13): 5 mg via ORAL
  Filled 2022-06-29 (×13): qty 1

## 2022-06-29 MED ORDER — SODIUM CHLORIDE 0.9 % IV SOLN
1.0000 g | INTRAVENOUS | Status: DC
  Administered 2022-06-30 – 2022-07-02 (×3): 1 g via INTRAVENOUS
  Filled 2022-06-29 (×3): qty 10

## 2022-06-29 MED ORDER — DOXYCYCLINE HYCLATE 100 MG PO TABS
100.0000 mg | ORAL_TABLET | Freq: Once | ORAL | Status: AC
  Administered 2022-06-29: 100 mg via ORAL
  Filled 2022-06-29: qty 1

## 2022-06-29 NOTE — Assessment & Plan Note (Signed)
-  K of 3.2. Replaced with oral 17mq x 2.

## 2022-06-29 NOTE — ED Notes (Signed)
ED TO INPATIENT HANDOFF REPORT  ED Nurse Name and Phone #: Rocco Pauls Name/Age/Gender Travis [name] 72 y.o. male Room/Bed: WA01/WA01  Code Status   Code Status: Prior  Home/SNF/Other Home Patient oriented to: self, place, time, and situation Is this baseline? Yes   Triage Complete: Triage complete  Chief Complaint Acute hypoxic respiratory failure ([place]) [J96.01]  Triage Note BIB [facility] from home for shortness of breath that began this AM. O2 saturation was 83% at home, recent DVT dx, on Eliquis Afib 148/76 BP 84 HR 95% 4 lpm 20g left hand   Allergies Allergies  Allergen Reactions   Linezolid Other (See Comments)    Burning tongue    Vancomycin Rash    Level of Care/Admitting Diagnosis ED Disposition     ED Disposition  Admit   Condition  --   Comment  Hospital Area: [facility] [[id number]]  Level of Care: Telemetry [5]  Admit to tele based on following criteria: Other see comments  Comments: rate  May admit patient to [facility] or [facility] if equivalent level of care is available:: No  Covid Evaluation: Asymptomatic - no recent exposure (last 10 days) testing not required  Diagnosis: Acute hypoxic respiratory failure [facility]) [id number]  Admitting Physician: Orene Desanctis [id number]  Attending Physician: Orene Desanctis [id number]  Certification:: I certify this patient will need inpatient services for at least 2 midnights  Estimated Length of Stay: 2          B Medical/Surgery History Past Medical History:  Diagnosis Date   A-fib ([place])    Arthritis    "comes and goes; mostly in hands, occasionally elbow" (0000000)   Complication of anesthesia    "just wanted to sleep alot  for couple days S/P VARICOCELE OR" (04/05/2017)   DVT (deep venous thrombosis) (HCC)    LLE   Dysrhythmia    PVCs    GERD (gastroesophageal reflux disease)    History of hiatal hernia    Hypertension    Impaired glucose tolerance    Multiple myeloma (HCC)     Obesity (BMI 30-39.9) 07/26/2016   OSA on CPAP 07/26/2016   Mild with AHI 12.5/hr now on CPAP at 14cm H2O   PAF (paroxysmal atrial fibrillation) ([place])    s/p PVI Ablation in 2018 // Flecainide Rx // Echo 8/21: EF 60-65, no RWMA, mild LVH, normal RV SF, moderate RVE, mild BAE, trivial MR, mild dilation of aortic root (40 mm)   Pneumonia    "may have had walking pneumonia a few years ago" (04/05/2017)   Pre-diabetes    Thrombophlebitis    Past Surgical History:  Procedure Laterality Date   ATRIAL FIBRILLATION ABLATION  04/05/2017   ATRIAL FIBRILLATION ABLATION N/A 04/05/2017   Procedure: ATRIAL FIBRILLATION ABLATION;  Surgeon: Thompson Grayer, MD;  Location: [place] [facility];  Service: Cardiovascular;  Laterality: N/A;   BONE BIOPSY Right 12/17/2021   Procedure: BONE BIOPSY;  Surgeon: Hiram Gash, MD;  Location: [place];  Service: Orthopedics;  Laterality: Right;   COMPLETE RIGHT HIP REPLACMENT  01/19/2019   EVALUATION UNDER ANESTHESIA WITH HEMORRHOIDECTOMY N/A 02/24/2017   Procedure: EXAM UNDER ANESTHESIA WITH  HEMORRHOIDECTOMY;  Surgeon: Michael Boston, MD;  [facility];  Service: General;  Laterality: N/A;   EXCISIONAL TOTAL HIP ARTHROPLASTY WITH ANTIBIOTIC SPACERS Right 09/25/2020   Procedure: EXCISIONAL TOTAL HIP ARTHROPLASTY WITH ANTIBIOTIC SPACERS;  Surgeon: Paralee Cancel, MD;  Location: [facility];  Service: Orthopedics;  Laterality: Right;   FLEXIBLE SIGMOIDOSCOPY N/A 04/15/2015   Procedure:  UNSEDATED FLEXIBLE SIGMOIDOSCOPY;  Surgeon: [name], MD;  [facility];  Service: Endoscopy;  Laterality: N/A;   HUMERUS IM NAIL Right 12/17/2021   Procedure: INTRAMEDULLARY (IM) NAIL HUMERAL;  Surgeon: Hiram Gash, MD;  Location: [place];  Service: Orthopedics;  Laterality: Right;   KNEE ARTHROSCOPY Left ~ 2016   POSTERIOR CERVICAL FUSION/FORAMINOTOMY N/A 06/04/2022   Procedure: Thoracic one - Thoracic Four Posterior lateral arthrodesis/  fusion and Thoracic two Corpectomy;  Surgeon: Ashok Pall, MD;  Location: [place];  Service: Neurosurgery;  Laterality: N/A;   REIMPLANTATION OF TOTAL HIP Right 12/25/2020   Procedure: REIMPLANTATION/REVISION OF RIGHT TOTAL HIP VERSUS REPEAT IRRIGATION AND DEBRIDEMENT;  Surgeon: Paralee Cancel, MD;  Location[facility];  Service: Orthopedics;  Laterality: Right;   TESTICLE SURGERY  1988   VARICOCELE   TONSILLECTOMY     VARICOSE VEIN SURGERY Bilateral      A IV Location/Drains/Wounds Patient Lines/Drains/Airways Status     Active Line/Drains/Airways     Name Placement date Placement time Site Days   Peripheral IV 06/29/22 20 G Anterior;Left Hand 06/29/22  1437  Hand  less than 1   Peripheral IV 06/29/22 20 G Right Antecubital 06/29/22  1502  Antecubital  less than 1            Intake/Output Last 24 hours No intake or output data in the 24 hours ending 06/29/22 1908  Labs/Imaging Results for orders placed or performed during the hospital encounter of 06/29/22 (from the past 48 hour(s))  Resp panel by RT-PCR (RSV, Flu A&B, Covid) Anterior Nasal Swab     Status: None   Collection Time: 06/29/22  3:00 PM   Specimen: Anterior Nasal Swab  Result Value Ref Range   SARS Coronavirus 2 by RT PCR NEGATIVE NEGATIVE    Comment: (NOTE) SARS-CoV-2 target nucleic acids are NOT DETECTED.  The SARS-CoV-2 RNA is generally detectable in upper respiratory specimens during the acute phase of infection. The lowest concentration of SARS-CoV-2 viral copies this assay can detect is 138 copies/mL. A negative result does not preclude SARS-Cov-2 infection and should not be used as the sole basis for treatment or other patient management decisions. A negative result may occur with  improper specimen collection/handling, submission of specimen other than nasopharyngeal swab, presence of viral mutation(s) within the areas targeted by this assay, and inadequate number of viral copies(<138 copies/mL). A  negative result must be combined with clinical observations, patient history, and epidemiological information. The expected result is Negative.  Fact Sheet for Patients:  EntrepreneurPulse.com.au  Fact Sheet for Healthcare Providers:  IncredibleEmployment.be  This test is no t yet approved or cleared by the [place] FDA and  has been authorized for detection and/or diagnosis of SARS-CoV-2 by FDA under an Emergency Use Authorization (EUA). This EUA will remain  in effect (meaning this test can be used) for the duration of the COVID-19 declaration under Section 564(b)(1) of the Act, 21 U.S.C.section 360bbb-3(b)(1), unless the authorization is terminated  or revoked sooner.       Influenza A by PCR NEGATIVE NEGATIVE   Influenza B by PCR NEGATIVE NEGATIVE    Comment: (NOTE) The Xpert Xpress SARS-CoV-2/FLU/RSV plus assay is intended as an aid in the diagnosis of influenza from Nasopharyngeal swab specimens and should not be used as a sole basis for treatment. Nasal washings and aspirates are unacceptable for Xpert Xpress SARS-CoV-2/FLU/RSV testing.  Fact  Sheet for Patients: EntrepreneurPulse.com.au  Fact Sheet for Healthcare Providers: IncredibleEmployment.be  This test is not yet approved or cleared by the [place] FDA and has been authorized for detection and/or diagnosis of SARS-CoV-2 by FDA under an Emergency Use Authorization (EUA). This EUA will remain in effect (meaning this test can be used) for the duration of the COVID-19 declaration under Section 564(b)(1) of the Act, 21 U.S.C. section 360bbb-3(b)(1), unless the authorization is terminated or revoked.     Resp Syncytial Virus by PCR NEGATIVE NEGATIVE    Comment: (NOTE) Fact Sheet for Patients: EntrepreneurPulse.com.au  Fact Sheet for Healthcare Providers: IncredibleEmployment.be  This test is not  yet approved or cleared by the [place] FDA and has been authorized for detection and/or diagnosis of SARS-CoV-2 by FDA under an Emergency Use Authorization (EUA). This EUA will remain in effect (meaning this test can be used) for the duration of the COVID-19 declaration under Section 564(b)(1) of the Act, 21 U.S.C. section 360bbb-3(b)(1), unless the authorization is terminated or revoked.  Performed at [facility], [place] [address], [place], [place] [address]   Comprehensive metabolic panel     Status: Abnormal   Collection Time: 06/29/22  3:12 PM  Result Value Ref Range   Sodium 133 (L) 135 - 145 mmol/L   Potassium 3.2 (L) 3.5 - 5.1 mmol/L   Chloride 102 98 - 111 mmol/L   CO2 24 22 - 32 mmol/L   Glucose, Bld 174 (H) 70 - 99 mg/dL    Comment: Glucose reference range applies only to samples taken after fasting for at least 8 hours.   BUN 17 8 - 23 mg/dL   Creatinine, Ser 0.79 0.61 - 1.24 mg/dL   Calcium 8.0 (L) 8.9 - 10.3 mg/dL   Total Protein 5.2 (L) 6.5 - 8.1 g/dL   Albumin 2.4 (L) 3.5 - 5.0 g/dL   AST 19 15 - 41 U/L   ALT 24 0 - 44 U/L   Alkaline Phosphatase 78 38 - 126 U/L   Total Bilirubin 0.7 0.3 - 1.2 mg/dL   GFR, Estimated >60 >60 mL/min    Comment: (NOTE) Calculated using the CKD-EPI Creatinine Equation (2021)    Anion gap 7 5 - 15    Comment: Performed at [facility], [place] [address] [place], [place] [address]  CBC     Status: Abnormal   Collection Time: 06/29/22  3:12 PM  Result Value Ref Range   WBC 9.0 4.0 - 10.5 K/uL   RBC 3.04 (L) 4.22 - 5.81 MIL/uL   Hemoglobin 9.7 (L) 13.0 - 17.0 g/dL   HCT 29.8 (L) 39.0 - 52.0 %   MCV 98.0 80.0 - 100.0 fL   MCH 31.9 26.0 - 34.0 pg   MCHC 32.6 30.0 - 36.0 g/dL   RDW 16.3 (H) 11.5 - 15.5 %   Platelets 231 150 - 400 K/uL   nRBC 0.0 0.0 - 0.2 %    Comment: Performed at [facility], [place] [address] [place], [place] [address]  Brain natriuretic peptide      Status: Abnormal   Collection Time: 06/29/22  3:12 PM  Result Value Ref Range   B Natriuretic Peptide 785.8 (H) 0.0 - 100.0 pg/mL    Comment: Performed at [facility], [place] [address] [place], [place] [address]   No results found.  Pending Labs [employer] (From admission, onward)    None       Vitals/Pain Today's  Vitals   06/29/22 1730 06/29/22 1830 06/29/22 1838 06/29/22 1845  BP: 139/75 (!) 153/86    Pulse: 83   87  Resp: (!) 24   (!) 24  Temp:   98.7 F (37.1 C)   TempSrc:   Oral   SpO2: 90%   92%  PainSc:        Isolation Precautions No active isolations  Medications Medications  cefTRIAXone (ROCEPHIN) 1 g in sodium chloride 0.9 % 100 mL IVPB (1 g Intravenous New Bag/Given 06/29/22 1840)  iohexol (OMNIPAQUE) 350 MG/ML injection 80 mL (80 mLs Intravenous Contrast Given 06/29/22 1610)  doxycycline (VIBRA-TABS) tablet 100 mg (100 mg Oral Given 06/29/22 1840)  furosemide (LASIX) injection 20 mg (20 mg Intravenous Given 06/29/22 1840)    Mobility walks with device     Focused Assessments Pulmonary Assessment Handoff:  Lung sounds:          R Recommendations: See Admitting Provider Note  Report given to:   Additional Notes: pt O2 sat drops, and HR elevates when he stands up- placed male [place]

## 2022-06-29 NOTE — Assessment & Plan Note (Signed)
-   pathologic thoracic spine fracture s/p posterior lateral arthrodesis/fusion and corpectomy on 06/04/22 with neurosurgery  -recommend ambulation and OOB daily

## 2022-06-29 NOTE — ED Triage Notes (Signed)
BIB [facility] from home for shortness of breath that began this AM. O2 saturation was 83% at home, recent DVT dx, on Eliquis Afib 148/76 BP 84 HR 95% 4 lpm 20g left hand

## 2022-06-29 NOTE — ED Provider Notes (Addendum)
72 yo male here with recently diagnosed DVT 1 week ago, on eliquis, here with SOB.  On 3L  here new oxygen requirement   Pending CT PE study  Physical Exam  BP (!) 143/75 (BP Location: Right Arm)   Pulse (!) 50   Temp 98.3 F (36.8 C) (Oral)   Resp 19   SpO2 92%   Physical Exam  Procedures  Procedures  ED Course / MDM    Medical Decision Making Amount and/or Complexity of Data Reviewed Labs: ordered. Radiology: ordered.  Risk Prescription drug management. Decision regarding hospitalization.   CT imaging is concerning for bilateral multifocal pneumonia.  IV Rocephin and doxycycline has been ordered.  Patient is also been ordered IV Lasix 20 mg dose for diuresis per my discussion with earlier EDP as there is concern for possible congestive heart failure with elevated BNP and new oxygen requirement.  Patient does not appear septic at this time.  He is stable on his oxygen.  He will be admitted to the hospital     Melanye Hiraldo, Carola Rhine, MD 06/29/22 [id number]    Wyvonnia Dusky, MD 06/29/22 [phone]

## 2022-06-29 NOTE — Assessment & Plan Note (Signed)
-   continue nightly CPAP °

## 2022-06-29 NOTE — Assessment & Plan Note (Signed)
-  Mild at 133 -Give IV Lasix by EDP. Will follow Na in the morning.

## 2022-06-29 NOTE — ED Notes (Signed)
Patient transported to x-ray. ?

## 2022-06-29 NOTE — Assessment & Plan Note (Signed)
-  Rates are going from 110-130 but non-sustained. Likely from ongoing pneumonia -continue diltiazem, amiodarone -may need PRN low dose beta blocker if rapid rates are more sustained

## 2022-06-29 NOTE — Assessment & Plan Note (Addendum)
-  secondary to community acquired pneumonia. Required 3L on presentation.  -CTA chest negative for PE with multifocal bilateral airspace disease consistent with bilateral pneumonia. Small bilateral pleural effusions. -continue IV Rocephin and Azithromcyin  -given IV Lasix 92m by EDP. Follow intake and output -wean O2 as tolerated with goal of >92%

## 2022-06-29 NOTE — Assessment & Plan Note (Signed)
-  continue home antihypertensives  -holding nighttime Hydralazine since he was given IV Lasix in ED

## 2022-06-29 NOTE — Assessment & Plan Note (Signed)
DX 06/21/22 following discontinuation of anticoagulation for recent spinal surgery -will complete 7 days of higher dose Eliquis tonight. Resume '5mg'$  BID tomorrow for both DVT and A.fib

## 2022-06-29 NOTE — ED Provider Notes (Addendum)
[place] EMERGENCY DEPARTMENT AT [facility] Provider Note   CSN: [id number] Arrival date & time: 06/29/22  1428     History  Chief Complaint  Patient presents with   Shortness of Breath    Travis [name] is a 72 y.o. male.  HPI Patient with history of multiple myeloma, recently diagnosed DVT presents with new dyspnea, and hypoxia. Patient was discharged from our facility 1 week ago, with increased Eliquis dosing.  He was previously on Eliquis due to history of A-fib.  Today he noticed dyspnea, and his physical therapist performed pulse oximetry found him to be hypoxic, 84% on room air.  EMS reports the patient improved with supplemental oxygen, 3 L nasal cannula.  Patient denies pain, chest or abdomen, syncope, does endorse dyspnea, as above.    Home Medications Prior to Admission medications   Medication Sig Start Date End Date Taking? Authorizing Provider  acetaminophen (TYLENOL) 325 MG tablet Take 2 tablets (650 mg total) by mouth every 6 (six) hours as needed. Patient not taking: Reported on 06/28/2022 03/15/22   Jeanell Sparrow, DO  acyclovir (ZOVIRAX) 400 MG tablet Take 1 tablet (400 mg total) by mouth 2 (two) times daily. 12/10/21   Orson Slick, MD  amiodarone (PACERONE) 200 MG tablet Take 1 tablet (200 mg total) by mouth daily. 05/12/22   Vickie Epley, MD  apixaban (ELIQUIS) 5 MG TABS tablet Take 2 tablets (10 mg total) by mouth 2 (two) times daily for 7 days. 06/22/22 06/29/22  Nita Sells, MD  apixaban (ELIQUIS) 5 MG TABS tablet Take 1 tablet (5 mg total) by mouth 2 (two) times daily. 06/29/22   Nita Sells, MD  cholecalciferol (VITAMIN D3) 25 MCG (1000 UNIT) tablet Take 1,000 Units by mouth daily.    [provider]  clobetasol cream (TEMOVATE) [id number] % Apply 1 Application topically daily as needed (rash). Patient not taking: Reported on 06/28/2022 08/05/21   [provider]  Cyanocobalamin (VITAMIN B-12 PO) Take 1,000  mcg by mouth daily. Not sure the dosage    [provider]  dexamethasone (DECADRON) 4 MG tablet Take 1 tablet Orally 4 times daily for 3 days    [provider]  diltiazem (CARDIZEM CD) 240 MG 24 hr capsule Take 1 capsule (240 mg total) by mouth daily. 06/23/22   Nita Sells, MD  fluticasone (FLONASE) 50 MCG/ACT nasal spray Place 1 spray into both nostrils daily as needed for allergies or rhinitis.    [provider]  folic acid (FOLVITE) 1 MG tablet Take 1 mg by mouth daily. 11/12/19   [provider]  furosemide (LASIX) 40 MG tablet Take 40 mg by mouth daily as needed for edema.    [provider]  gabapentin (NEURONTIN) 300 MG capsule TAKE TWO CAPSULES BY MOUTH TWICE A DAY Patient taking differently: Take 600 mg by mouth 2 (two) times daily. 03/29/22   Narda Amber K, DO  hydrALAZINE (APRESOLINE) 25 MG tablet Take 1.5 tablets (37.5 mg total) by mouth 3 (three) times daily. 05/12/22   Vickie Epley, MD  HYDROcodone-acetaminophen (NORCO) 7.5-325 MG tablet Take 1 tablet by mouth every 6 (six) hours as needed for moderate pain. Patient not taking: Reported on 06/28/2022 06/18/22   [provider]  lisinopril (ZESTRIL) 40 MG tablet TAKE ONE TABLET BY MOUTH ONE TIME DAILY Patient taking differently: Take 40 mg by mouth daily. 08/12/21   Fay Records, MD  methocarbamol (ROBAXIN) 750 MG tablet Take  750 mg by mouth 3 (three) times daily. 06/16/22   [provider]  Omega-3 Fatty Acids (FISH OIL PO) Take 1 Capful by mouth daily. Patient not taking: Reported on 06/28/2022    [provider]  pomalidomide (POMALYST) 4 MG capsule Take 4 mg by mouth daily. Take for 21 days on, 7 days off. Repeat every 28 days.    [provider]  potassium chloride SA (KLOR-CON M) 20 MEQ tablet Take 2 tablets (40 mEq total) by mouth daily. 06/22/22   Nita Sells, MD  Psyllium (GERI-MUCIL) 51.7 % POWD Take by mouth. 11/22/18    [provider]  sodium chloride (OCEAN) 0.65 % SOLN nasal spray Place 1 spray into both nostrils as needed for congestion.    [provider]  traMADol (ULTRAM) 50 MG tablet Take 1 tablet (50 mg total) by mouth every 6 (six) hours as needed. Patient taking differently: Take 50 mg by mouth every 6 (six) hours as needed for moderate pain. 05/18/22   Dede Query T, PA-C  triamcinolone cream (KENALOG) 0.1 % Apply 1 application topically daily as needed (itching). Patient not taking: Reported on 06/28/2022 06/05/19   [provider]      Allergies    Linezolid and Vancomycin    Review of Systems   Review of Systems  All other systems reviewed and are negative.   Physical Exam Updated Vital Signs BP (!) 143/75 (BP Location: Right Arm)   Pulse 79   Temp 98.3 F (36.8 C) (Oral)   Resp (!) 25   SpO2 92%  Physical Exam Vitals and nursing note reviewed.  Constitutional:      General: He is not in acute distress.    Appearance: He is well-developed. He is obese.  HENT:     Head: Normocephalic and atraumatic.  Eyes:     Conjunctiva/sclera: Conjunctivae normal.  Cardiovascular:     Rate and Rhythm: Normal rate. Rhythm irregular.  Pulmonary:     Effort: Pulmonary effort is normal. Tachypnea present.  Abdominal:     General: There is no distension.  Musculoskeletal:     Comments: No obvious deformities  Skin:    General: Skin is warm and dry.  Neurological:     Mental Status: He is alert and oriented to person, place, and time.     ED Results / Procedures / Treatments   Labs (all labs ordered are listed, but only abnormal results are displayed) Labs Reviewed  COMPREHENSIVE METABOLIC PANEL - Abnormal; Notable for the following components:      Result Value   Sodium 133 (*)    Potassium 3.2 (*)    Glucose, Bld 174 (*)    Calcium 8.0 (*)    Total Protein 5.2 (*)    Albumin 2.4 (*)    All other components within normal limits  CBC - Abnormal; Notable  for the following components:   RBC 3.04 (*)    Hemoglobin 9.7 (*)    HCT 29.8 (*)    RDW 16.3 (*)    All other components within normal limits  BRAIN NATRIURETIC PEPTIDE - Abnormal; Notable for the following components:   B Natriuretic Peptide 785.8 (*)    All other components within normal limits  RESP PANEL BY RT-PCR (RSV, FLU A&B, COVID)  [id number]  I-STAT CHEM 8, ED    EKG EKG Interpretation  Date/Time:  Tuesday June 29 2022 14:41:44 EST Ventricular Rate:  86 PR Interval:    QRS Duration: 93 QT  Interval:  385 QTC Calculation: 461 R Axis:   26 Text Interpretation: Atrial flutter/fibrillation Abnormal ECG Confirmed by Carmin Muskrat [phone]) on 06/29/2022 2:55:00 PM  Radiology No results found.  Procedures Procedures    Medications Ordered in ED Medications  iohexol (OMNIPAQUE) 350 MG/ML injection 80 mL (80 mLs Intravenous Contrast Given 06/29/22 1610)    ED Course/ Medical Decision Making/ A&P                             Medical Decision Making Patient with multiple myeloma recent hospitalizations with DVT now presents with dyspnea, is hypoxic in the field and here.  Patient has no chest pain, recently reassuring for low suspicion of ACS.  Differential includes progression of multiple myeloma, congestive heart failure, PE /possible resistance to apixaban.  Patient placed on continuous cardiac monitoring, pulse oximetry, labs CT x-ray all started.  Cardiac A-fib 90 abnormal Pulse ox 96% 3 L nasal cannula abnormal   Amount and/or Complexity of Data Reviewed Independent Historian: EMS    Details: HPI External Data Reviewed: notes.    Details: Discharge summary from last week reviewed, including increased dosing of apixaban this week.  Echocardiogram from that hospitalization reviewed as well, see results below. Labs: ordered. Decision-making details documented in ED Course. Radiology: ordered and independent interpretation performed. Decision-making details  documented in ED Course. ECG/medicine tests: ordered and independent interpretation performed. Decision-making details documented in ED Course.  Risk OTC drugs. Prescription drug management. Decision regarding hospitalization.   ECHO 2/24 1. Appears to be in sinus rhythm at the time of evaluation- A waves  present on diastology. Left ventricular ejection fraction, by estimation,  is 60 to 65%. The left ventricle has normal function. The left ventricle  has no regional wall motion  abnormalities. There is mild concentric left ventricular hypertrophy. Left  ventricular diastolic parameters are consistent with Grade II diastolic  dysfunction (pseudonormalization).   2. Right ventricular systolic function is normal. The right ventricular  size is normal. There is normal pulmonary artery systolic pressure.   3. The mitral valve is grossly normal. No evidence of mitral valve  regurgitation.   4. The aortic valve is tricuspid. Aortic valve regurgitation is not  visualized. No aortic stenosis is present.   5. The inferior vena cava is normal in size with greater than 50%  respiratory variability, suggesting right atrial pressure of 3 mmHg.     4:11 PM Patient in similar condition.  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 Patient's initial labs are somewhat reassuring, no evidence for anemia, no substantial electrolyte abnormalities, renal function is preserved.  BNP is elevated, and with history of diastolic dysfunction, this may be contributory to today's presentation.  He is reportedly taking his Lasix.  In essence this adult male with multiple medical issues including multiple myeloma with thoracic spine disease, recent DVT diagnosis, heart failure/diastolic dysfunction presents via EMS with hypoxia, dyspnea.  Patient is awake and alert, afebrile, no early  evidence for pneumonia, x-ray does not appear to show this either.  Patient's initial labs as above consistent with fluid overload status, but given his recent DVT history, PE scan is pending.  Patient will likely require admission given his new O2 requirement.  Next day chart completion.  CT without evidence for pulmonary embolism but consistent with multifocal pneumonia.  Final Clinical Impression(s) /  ED Diagnoses Final diagnoses:  SOB (shortness of breath)  Hypoxia     Carmin Muskrat, MD 06/29/22 1556    Carmin Muskrat, MD 06/29/22 1611    Carmin Muskrat, MD 06/30/22 [phone]

## 2022-06-29 NOTE — H&P (Signed)
History and Physical    Patient: Travis [name] [name] DOB: 03-28-1951 DOA: 06/29/2022 DOS: the patient was seen and examined on 06/30/2022 PCP: [name], MD  Patient coming from: Home  Chief Complaint:  Chief Complaint  Patient presents with   Shortness of Breath   HPI: Travis [name] is a 72 y.o. male with medical history significant of  HTN, HLD, OSA on CPAP, multiple myeloma currently on oral chemotherapy and radiation therapy, who has known atrial fibrillation on Eliquis, his anticoagulation was recently held at the beginning of February due to pathologic thoracic spine fracture, requiring posterior lateral arthrodesis/fusion and corpectomy for tumor resection with Dr. Christella Noa on 2/2. Recently discharged on 2/20 with new DVT left peroneal vein who presents with shortness of breath and hypoxia.   Has been feeling short of breath since discharge but much worse today after working with therapy at home. He was hypoxic to 80s and felt dizzy. Also has a non-productive cough that has worsened. No chest pain. No nausea, vomiting or diarrhea.  No tobacco use. Reports compliance with Eliquis.   In the ED, he was afebrile, HR 80, RR 24, BP 143/75. On 3L oxygen supplement.   No leukocytosis, Hgb of 9.7. Plt of  231.  Na of 133, K of 3.2, creatinine 0.79, CBG of 174.   CTA chest was negative for pulmonary embolism and bilateral pneumonia.  Negative Flu/COVID/RSV  Pt was started on doxycycline and IV Rocephin.  He was also given low dose IV Lasix '20mg'$  for possible overload with BNP 785. Review of Systems: As mentioned in the history of present illness. All other systems reviewed and are negative. Past Medical History:  Diagnosis Date   A-fib [facility])    Arthritis    "comes and goes; mostly in hands, occasionally elbow" (0000000)   Complication of anesthesia    "just wanted to sleep alot  for couple days S/P VARICOCELE OR" (04/05/2017)   DVT (deep venous thrombosis)  (HCC)    LLE   Dysrhythmia    PVCs    GERD (gastroesophageal reflux disease)    History of hiatal hernia    Hypertension    Impaired glucose tolerance    Multiple myeloma (HCC)    Obesity (BMI 30-39.9) 07/26/2016   OSA on CPAP 07/26/2016   Mild with AHI 12.5/hr now on CPAP at 14cm H2O   PAF (paroxysmal atrial fibrillation) ([place])    s/p PVI Ablation in 2018 // Flecainide Rx // Echo 8/21: EF 60-65, no RWMA, mild LVH, normal RV SF, moderate RVE, mild BAE, trivial MR, mild dilation of aortic root (40 mm)   Pneumonia    "may have had walking pneumonia a few years ago" (04/05/2017)   Pre-diabetes    Thrombophlebitis    Past Surgical History:  Procedure Laterality Date   ATRIAL FIBRILLATION ABLATION  04/05/2017   ATRIAL FIBRILLATION ABLATION N/A 04/05/2017   Procedure: ATRIAL FIBRILLATION ABLATION;  Surgeon: Thompson Grayer, MD;  Location: [place] [facility];  Service: Cardiovascular;  Laterality: N/A;   BONE BIOPSY Right 12/17/2021   Procedure: BONE BIOPSY;  Surgeon: Hiram Gash, MD;  Location: [place];  Service: Orthopedics;  Laterality: Right;   COMPLETE RIGHT HIP REPLACMENT  01/19/2019   EVALUATION UNDER ANESTHESIA WITH HEMORRHOIDECTOMY N/A 02/24/2017   Procedure: EXAM UNDER ANESTHESIA WITH  HEMORRHOIDECTOMY;  Surgeon: Michael Boston, MD;  Location: [facility];  Service: General;  Laterality: N/A;   EXCISIONAL TOTAL HIP ARTHROPLASTY WITH ANTIBIOTIC SPACERS Right  09/25/2020   Procedure: EXCISIONAL TOTAL HIP ARTHROPLASTY WITH ANTIBIOTIC SPACERS;  Surgeon: Paralee Cancel, MD;  Location: [facility];  Service: Orthopedics;  Laterality: Right;   FLEXIBLE SIGMOIDOSCOPY N/A 04/15/2015   Procedure:  UNSEDATED FLEXIBLE SIGMOIDOSCOPY;  Surgeon: [name], MD;  Location: [facility];  Service: Endoscopy;  Laterality: N/A;   HUMERUS IM NAIL Right 12/17/2021   Procedure: INTRAMEDULLARY (IM) NAIL HUMERAL;  Surgeon: Hiram Gash, MD;  Location: [place];  Service:  Orthopedics;  Laterality: Right;   KNEE ARTHROSCOPY Left ~ 2016   POSTERIOR CERVICAL FUSION/FORAMINOTOMY N/A 06/04/2022   Procedure: Thoracic one - Thoracic Four Posterior lateral arthrodesis/ fusion and Thoracic two Corpectomy;  Surgeon: Ashok Pall, MD;  Location: [place];  Service: Neurosurgery;  Laterality: N/A;   REIMPLANTATION OF TOTAL HIP Right 12/25/2020   Procedure: REIMPLANTATION/REVISION OF RIGHT TOTAL HIP VERSUS REPEAT IRRIGATION AND DEBRIDEMENT;  Surgeon: Paralee Cancel, MD;  Location: [facility];  Service: Orthopedics;  Laterality: Right;   TESTICLE SURGERY  1988   VARICOCELE   TONSILLECTOMY     VARICOSE VEIN SURGERY Bilateral    Social History:  reports that he has never smoked. He has never used smokeless tobacco. He reports current alcohol use of about 2.0 standard drinks of alcohol per week. He reports that he does not use drugs.  Allergies  Allergen Reactions   Linezolid Other (See Comments)    Burning tongue    Vancomycin Rash    Family History  Problem Relation Age of Onset   Dementia Mother    Stroke Father    Atrial fibrillation Father    Hypertension Father    Hypertension Paternal Grandfather    Dementia Maternal Grandmother     Prior to Admission medications   Medication Sig Start Date End Date Taking? Authorizing Provider  acetaminophen (TYLENOL) 325 MG tablet Take 2 tablets (650 mg total) by mouth every 6 (six) hours as needed. Patient not taking: Reported on 06/28/2022 03/15/22   Jeanell Sparrow, DO  acyclovir (ZOVIRAX) 400 MG tablet Take 1 tablet (400 mg total) by mouth 2 (two) times daily. 12/10/21   Orson Slick, MD  amiodarone (PACERONE) 200 MG tablet Take 1 tablet (200 mg total) by mouth daily. 05/12/22   Vickie Epley, MD  apixaban (ELIQUIS) 5 MG TABS tablet Take 2 tablets (10 mg total) by mouth 2 (two) times daily for 7 days. 06/22/22 06/29/22  Nita Sells, MD  apixaban (ELIQUIS) 5 MG TABS tablet Take 1 tablet (5 mg total) by mouth 2 (two)  times daily. 06/29/22   Nita Sells, MD  cholecalciferol (VITAMIN D3) 25 MCG (1000 UNIT) tablet Take 1,000 Units by mouth daily.    [provider]  clobetasol cream (TEMOVATE) AB-123456789 % Apply 1 Application topically daily as needed (rash). Patient not taking: Reported on 06/28/2022 08/05/21   [provider]  Cyanocobalamin (VITAMIN B-12 PO) Take 1,000 mcg by mouth daily. Not sure the dosage    [provider]  dexamethasone (DECADRON) 4 MG tablet Take 1 tablet Orally 4 times daily for 3 days    [provider]  diltiazem (CARDIZEM CD) 240 MG 24 hr capsule Take 1 capsule (240 mg total) by mouth daily. 06/23/22   Nita Sells, MD  fluticasone (FLONASE) 50 MCG/ACT nasal spray Place 1 spray into both nostrils daily as needed for allergies or rhinitis.    [provider]  folic acid (FOLVITE) 1 MG tablet Take 1 mg by mouth daily.  11/12/19   [provider]  furosemide (LASIX) 40 MG tablet Take 40 mg by mouth daily as needed for edema.    [provider]  gabapentin (NEURONTIN) 300 MG capsule TAKE TWO CAPSULES BY MOUTH TWICE A DAY Patient taking differently: Take 600 mg by mouth 2 (two) times daily. 03/29/22   Narda Amber K, DO  hydrALAZINE (APRESOLINE) 25 MG tablet Take 1.5 tablets (37.5 mg total) by mouth 3 (three) times daily. 05/12/22   Vickie Epley, MD  HYDROcodone-acetaminophen (NORCO) 7.5-325 MG tablet Take 1 tablet by mouth every 6 (six) hours as needed for moderate pain. Patient not taking: Reported on 06/28/2022 06/18/22   [provider]  lisinopril (ZESTRIL) 40 MG tablet TAKE ONE TABLET BY MOUTH ONE TIME DAILY Patient taking differently: Take 40 mg by mouth daily. 08/12/21   Fay Records, MD  methocarbamol (ROBAXIN) 750 MG tablet Take 750 mg by mouth 3 (three) times daily. 06/16/22   [provider]  Omega-3 Fatty Acids (FISH OIL PO) Take 1 Capful by mouth daily. Patient not taking: Reported on  06/28/2022    [provider]  pomalidomide (POMALYST) 4 MG capsule Take 4 mg by mouth daily. Take for 21 days on, 7 days off. Repeat every 28 days.    [provider]  potassium chloride SA (KLOR-CON M) 20 MEQ tablet Take 2 tablets (40 mEq total) by mouth daily. 06/22/22   Nita Sells, MD  Psyllium (GERI-MUCIL) 51.7 % POWD Take by mouth. 11/22/18   [provider]  sodium chloride (OCEAN) 0.65 % SOLN nasal spray Place 1 spray into both nostrils as needed for congestion.    [provider]  traMADol (ULTRAM) 50 MG tablet Take 1 tablet (50 mg total) by mouth every 6 (six) hours as needed. Patient taking differently: Take 50 mg by mouth every 6 (six) hours as needed for moderate pain. 05/18/22   Dede Query T, PA-C  triamcinolone cream (KENALOG) 0.1 % Apply 1 application topically daily as needed (itching). Patient not taking: Reported on 06/28/2022 06/05/19   [provider]    Physical Exam: Vitals:   06/29/22 2341 06/29/22 2345 06/29/22 2357 06/30/22 0001  BP:    (!) 144/81  Pulse: (!) 113 (!) 113 (!) 113 (!) 113  Resp:      Temp:      TempSrc:      SpO2:       Constitutional: NAD, calm, comfortable, mildly ill-appearing elderly male laying flat in bed Eyes:  lids and conjunctivae normal ENMT: Mucous membranes are moist.  Neck: normal, supple Respiratory: clear to auscultation bilaterally, no wheezing, no crackles. Normal respiratory effort. No accessory muscle use.  On 3 L supplemental oxygen via nasal cannula Cardiovascular: Regular rate and rhythm, no murmurs / rubs / gallops. No extremity edema. .  Abdomen: Soft, nondistended no tenderness,  Bowel sounds positive.  Musculoskeletal: no clubbing / cyanosis. No joint deformity upper and lower extremities. Good ROM, no contractures. Normal muscle tone.  Skin: no rashes, lesions, ulcers. No induration Neurologic: CN 2-12 grossly intact. Psychiatric: Normal judgment and insight. Alert and  oriented x 3. Normal mood. Data Reviewed:  See  HPI  Assessment and Plan: * Acute hypoxic respiratory failure ([place]) -secondary to community acquired pneumonia. Required 3L on presentation.  -CTA chest negative for PE with multifocal bilateral airspace disease consistent with bilateral pneumonia. Small bilateral pleural effusions. -continue IV Rocephin and Azithromcyin  -given IV Lasix '20mg'$  by EDP. Follow intake and  output -wean O2 as tolerated with goal of >92%  History of thoracic surgery - pathologic thoracic spine fracture s/p posterior lateral arthrodesis/fusion and corpectomy on 06/04/22 with neurosurgery  -recommend ambulation and OOB daily  Hyponatremia -Mild at 133 -Give IV Lasix by EDP. Will follow Na in the morning.  Hypokalemia -K of 3.2. Replaced with oral 38mq x 2.   History of DVT (deep vein thrombosis) DX 06/21/22 following discontinuation of anticoagulation for recent spinal surgery -will complete 7 days of higher dose Eliquis tonight. Resume '5mg'$  BID tomorrow for both DVT and A.fib  Smoldering multiple myeloma -Cycle 1, Day 1 of Velcade/Pom/Dex 06/23/22 -Follows with oncologist Dr. DLorenso Courier-acyclovir '400mg'$  PO BID for VCZ prophylaxis   OSA (obstructive sleep apnea) -continue nightly CPAP  Atrial fibrillation with RVR ([place] - Likely from ongoing pneumonia -Rates are going from 110-130 and became more sustained at 120 despite low dose Lopressor.  -will start IV diltiazem infusion  -continue home amiodarone   Hypertension -continue home antihypertensives  -holding nighttime Hydralazine since he was given IV Lasix in ED      Advance Care Planning Full  Consults: none  Family Communication: wife at bedside   Severity of Illness: The appropriate patient status for this patient is INPATIENT. Inpatient status is judged to be reasonable and necessary in order to provide the required intensity of service to ensure the patient's safety. The patient's  presenting symptoms, physical exam findings, and initial radiographic and laboratory data in the context of their chronic comorbidities is felt to place them at high risk for further clinical deterioration. Furthermore, it is not anticipated that the patient will be medically stable for discharge from the hospital within 2 midnights of admission.   * I certify that at the point of admission it is my clinical judgment that the patient will require inpatient hospital care spanning beyond 2 midnights from the point of admission due to high intensity of service, high risk for further deterioration and high frequency of surveillance required.*  Author: COrene Desanctis DO 06/30/2022 12:21 AM  For on call review [email or web address]

## 2022-06-29 NOTE — Assessment & Plan Note (Signed)
-  Cycle 1, Day 1 of Velcade/Pom/Dex 06/23/22 -Follows with oncologist Dr. Lorenso Courier -acyclovir 481m PO BID for VCZ prophylaxis

## 2022-06-29 NOTE — ED Provider Notes (Incomplete Revision)
[place] EMERGENCY DEPARTMENT AT [facility] Provider Note   CSN: [id number] Arrival date & time: 06/29/22  1428     History  Chief Complaint  Patient presents with   Shortness of Breath    Travis [name] is a 72 y.o. male.  HPI Patient with history of multiple myeloma, recently diagnosed DVT presents with new dyspnea, and hypoxia. Patient was discharged from our facility 1 week ago, with increased Eliquis dosing.  He was previously on Eliquis due to history of A-fib.  Today he noticed dyspnea, and his physical therapist performed pulse oximetry found him to be hypoxic, 84% on room air.  EMS reports the patient improved with supplemental oxygen, 3 L nasal cannula.  Patient denies pain, chest or abdomen, syncope, does endorse dyspnea, as above.    Home Medications Prior to Admission medications   Medication Sig Start Date End Date Taking? Authorizing Provider  acetaminophen (TYLENOL) 325 MG tablet Take 2 tablets (650 mg total) by mouth every 6 (six) hours as needed. Patient not taking: Reported on 06/28/2022 03/15/22   Jeanell Sparrow, DO  acyclovir (ZOVIRAX) 400 MG tablet Take 1 tablet (400 mg total) by mouth 2 (two) times daily. 12/10/21   Orson Slick, MD  amiodarone (PACERONE) 200 MG tablet Take 1 tablet (200 mg total) by mouth daily. 05/12/22   Vickie Epley, MD  apixaban (ELIQUIS) 5 MG TABS tablet Take 2 tablets (10 mg total) by mouth 2 (two) times daily for 7 days. 06/22/22 06/29/22  Nita Sells, MD  apixaban (ELIQUIS) 5 MG TABS tablet Take 1 tablet (5 mg total) by mouth 2 (two) times daily. 06/29/22   Nita Sells, MD  cholecalciferol (VITAMIN D3) 25 MCG (1000 UNIT) tablet Take 1,000 Units by mouth daily.    [provider]  clobetasol cream (TEMOVATE) AB-123456789 % Apply 1 Application topically daily as needed (rash). Patient not taking: Reported on 06/28/2022 08/05/21   [provider]  Cyanocobalamin (VITAMIN B-12 PO) Take 1,000  mcg by mouth daily. Not sure the dosage    [provider]  dexamethasone (DECADRON) 4 MG tablet Take 1 tablet Orally 4 times daily for 3 days    [provider]  diltiazem (CARDIZEM CD) 240 MG 24 hr capsule Take 1 capsule (240 mg total) by mouth daily. 06/23/22   Nita Sells, MD  fluticasone (FLONASE) 50 MCG/ACT nasal spray Place 1 spray into both nostrils daily as needed for allergies or rhinitis.    [provider]  folic acid (FOLVITE) 1 MG tablet Take 1 mg by mouth daily. 11/12/19   [provider]  furosemide (LASIX) 40 MG tablet Take 40 mg by mouth daily as needed for edema.    [provider]  gabapentin (NEURONTIN) 300 MG capsule TAKE TWO CAPSULES BY MOUTH TWICE A DAY Patient taking differently: Take 600 mg by mouth 2 (two) times daily. 03/29/22   Narda Amber K, DO  hydrALAZINE (APRESOLINE) 25 MG tablet Take 1.5 tablets (37.5 mg total) by mouth 3 (three) times daily. 05/12/22   Vickie Epley, MD  HYDROcodone-acetaminophen (NORCO) 7.5-325 MG tablet Take 1 tablet by mouth every 6 (six) hours as needed for moderate pain. Patient not taking: Reported on 06/28/2022 06/18/22   [provider]  lisinopril (ZESTRIL) 40 MG tablet TAKE ONE TABLET BY MOUTH ONE TIME DAILY Patient taking differently: Take 40 mg by mouth daily. 08/12/21   Fay Records, MD  methocarbamol (ROBAXIN) 750 MG tablet Take  750 mg by mouth 3 (three) times daily. 06/16/22   [provider]  Omega-3 Fatty Acids (FISH OIL PO) Take 1 Capful by mouth daily. Patient not taking: Reported on 06/28/2022    [provider]  pomalidomide (POMALYST) 4 MG capsule Take 4 mg by mouth daily. Take for 21 days on, 7 days off. Repeat every 28 days.    [provider]  potassium chloride SA (KLOR-CON M) 20 MEQ tablet Take 2 tablets (40 mEq total) by mouth daily. 06/22/22   Nita Sells, MD  Psyllium (GERI-MUCIL) 51.7 % POWD Take by mouth. 11/22/18    [provider]  sodium chloride (OCEAN) 0.65 % SOLN nasal spray Place 1 spray into both nostrils as needed for congestion.    [provider]  traMADol (ULTRAM) 50 MG tablet Take 1 tablet (50 mg total) by mouth every 6 (six) hours as needed. Patient taking differently: Take 50 mg by mouth every 6 (six) hours as needed for moderate pain. 05/18/22   Dede Query T, PA-C  triamcinolone cream (KENALOG) 0.1 % Apply 1 application topically daily as needed (itching). Patient not taking: Reported on 06/28/2022 06/05/19   [provider]      Allergies    Linezolid and Vancomycin    Review of Systems   Review of Systems  All other systems reviewed and are negative.   Physical Exam Updated Vital Signs BP (!) 143/75 (BP Location: Right Arm)   Pulse 79   Temp 98.3 F (36.8 C) (Oral)   Resp (!) 25   SpO2 92%  Physical Exam Vitals and nursing note reviewed.  Constitutional:      General: He is not in acute distress.    Appearance: He is well-developed. He is obese.  HENT:     Head: Normocephalic and atraumatic.  Eyes:     Conjunctiva/sclera: Conjunctivae normal.  Cardiovascular:     Rate and Rhythm: Normal rate. Rhythm irregular.  Pulmonary:     Effort: Pulmonary effort is normal. Tachypnea present.  Abdominal:     General: There is no distension.  Musculoskeletal:     Comments: No obvious deformities  Skin:    General: Skin is warm and dry.  Neurological:     Mental Status: He is alert and oriented to person, place, and time.     ED Results / Procedures / Treatments   Labs (all labs ordered are listed, but only abnormal results are displayed) Labs Reviewed  COMPREHENSIVE METABOLIC PANEL - Abnormal; Notable for the following components:      Result Value   Sodium 133 (*)    Potassium 3.2 (*)    Glucose, Bld 174 (*)    Calcium 8.0 (*)    Total Protein 5.2 (*)    Albumin 2.4 (*)    All other components within normal limits  CBC - Abnormal; Notable  for the following components:   RBC 3.04 (*)    Hemoglobin 9.7 (*)    HCT 29.8 (*)    RDW 16.3 (*)    All other components within normal limits  BRAIN NATRIURETIC PEPTIDE - Abnormal; Notable for the following components:   B Natriuretic Peptide 785.8 (*)    All other components within normal limits  RESP PANEL BY RT-PCR (RSV, FLU A&B, COVID)  RVPGX2  I-STAT CHEM 8, ED    EKG EKG Interpretation  Date/Time:  Tuesday June 29 2022 14:41:44 EST Ventricular Rate:  86 PR Interval:    QRS Duration: 93 QT  Interval:  385 QTC Calculation: 461 R Axis:   26 Text Interpretation: Atrial flutter/fibrillation Abnormal ECG Confirmed by Carmin Muskrat [phone]) on 06/29/2022 2:55:00 PM  Radiology No results found.  Procedures Procedures    Medications Ordered in ED Medications  iohexol (OMNIPAQUE) 350 MG/ML injection 80 mL (80 mLs Intravenous Contrast Given 06/29/22 1610)    ED Course/ Medical Decision Making/ A&P                             Medical Decision Making Patient with multiple myeloma recent hospitalizations with DVT now presents with dyspnea, is hypoxic in the field and here.  Patient has no chest pain, recently reassuring for low suspicion of ACS.  Differential includes progression of multiple myeloma, congestive heart failure, PE /possible resistance to apixaban.  Patient placed on continuous cardiac monitoring, pulse oximetry, labs CT x-ray all started.  Cardiac A-fib 90 abnormal Pulse ox 96% 3 L nasal cannula abnormal   Amount and/or Complexity of Data Reviewed Independent Historian: EMS    Details: HPI External Data Reviewed: notes.    Details: Discharge summary from last week reviewed, including increased dosing of apixaban this week.  Echocardiogram from that hospitalization reviewed as well, see results below. Labs: ordered. Decision-making details documented in ED Course. Radiology: ordered and independent interpretation performed. Decision-making details  documented in ED Course. ECG/medicine tests: ordered and independent interpretation performed. Decision-making details documented in ED Course.  Risk OTC drugs. Prescription drug management. Decision regarding hospitalization.   ECHO 2/24 1. Appears to be in sinus rhythm at the time of evaluation- A waves  present on diastology. Left ventricular ejection fraction, by estimation,  is 60 to 65%. The left ventricle has normal function. The left ventricle  has no regional wall motion  abnormalities. There is mild concentric left ventricular hypertrophy. Left  ventricular diastolic parameters are consistent with Grade II diastolic  dysfunction (pseudonormalization).   2. Right ventricular systolic function is normal. The right ventricular  size is normal. There is normal pulmonary artery systolic pressure.   3. The mitral valve is grossly normal. No evidence of mitral valve  regurgitation.   4. The aortic valve is tricuspid. Aortic valve regurgitation is not  visualized. No aortic stenosis is present.   5. The inferior vena cava is normal in size with greater than 50%  respiratory variability, suggesting right atrial pressure of 3 mmHg.     4:11 PM Patient in similar condition.  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 Patient's initial labs are somewhat reassuring, no evidence for anemia, no substantial electrolyte abnormalities, renal function is preserved.  BNP is elevated, and with history of diastolic dysfunction, this may be contributory to today's presentation.  He is reportedly taking his Lasix.  In essence this adult male with multiple medical issues including multiple myeloma with thoracic spine disease, recent DVT diagnosis, heart failure/diastolic dysfunction presents via EMS with hypoxia, dyspnea.  Patient is awake and alert, afebrile, no early  evidence for pneumonia, x-ray does not appear to show this either.  Patient's initial labs as above consistent with fluid overload status, but given his recent DVT history, PE scan is pending.  Patient will likely require admission given his new O2 requirement.    Final Clinical Impression(s) / ED Diagnoses Final diagnoses:  SOB (shortness of breath)  Hypoxia  Carmin Muskrat, MD 06/29/22 1556    Carmin Muskrat, MD 06/29/22 [id number]

## 2022-06-30 ENCOUNTER — Ambulatory Visit: Payer: Medicare Other

## 2022-06-30 ENCOUNTER — Telehealth: Payer: Self-pay

## 2022-06-30 ENCOUNTER — Inpatient Hospital Stay: Payer: Medicare Other

## 2022-06-30 DIAGNOSIS — J9601 Acute respiratory failure with hypoxia: Secondary | ICD-10-CM | POA: Diagnosis not present

## 2022-06-30 LAB — CBC
HCT: 29.7 % — ABNORMAL LOW (ref 39.0–52.0)
Hemoglobin: 10 g/dL — ABNORMAL LOW (ref 13.0–17.0)
MCH: 32.9 pg (ref 26.0–34.0)
MCHC: 33.7 g/dL (ref 30.0–36.0)
MCV: 97.7 fL (ref 80.0–100.0)
Platelets: 211 10*3/uL (ref 150–400)
RBC: 3.04 MIL/uL — ABNORMAL LOW (ref 4.22–5.81)
RDW: 16.4 % — ABNORMAL HIGH (ref 11.5–15.5)
WBC: 7.6 10*3/uL (ref 4.0–10.5)
nRBC: 0 % (ref 0.0–0.2)

## 2022-06-30 LAB — BASIC METABOLIC PANEL
Anion gap: 7 (ref 5–15)
BUN: 16 mg/dL (ref 8–23)
CO2: 25 mmol/L (ref 22–32)
Calcium: 7.4 mg/dL — ABNORMAL LOW (ref 8.9–10.3)
Chloride: 104 mmol/L (ref 98–111)
Creatinine, Ser: 0.79 mg/dL (ref 0.61–1.24)
GFR, Estimated: >60 mL/min (ref >60)
Glucose, Bld: 117 mg/dL — ABNORMAL HIGH (ref 70–99)
Potassium: 3.1 mmol/L — ABNORMAL LOW (ref 3.5–5.1)
Sodium: 136 mmol/L (ref 135–145)

## 2022-06-30 MED ORDER — ACETAMINOPHEN 325 MG PO TABS
650.0000 mg | ORAL_TABLET | Freq: Four times a day (QID) | ORAL | Status: DC | PRN
  Administered 2022-07-01: 650 mg via ORAL
  Filled 2022-06-30 (×2): qty 2

## 2022-06-30 MED ORDER — SALINE SPRAY 0.65 % NA SOLN: 1.0000 | NASAL | Status: DC | PRN

## 2022-06-30 MED ORDER — FUROSEMIDE 10 MG/ML IJ SOLN
40.0000 mg | Freq: Once | INTRAMUSCULAR | Status: AC
  Administered 2022-06-30: 40 mg via INTRAVENOUS
  Filled 2022-06-30: qty 4

## 2022-06-30 MED ORDER — ACYCLOVIR 400 MG PO TABS
400.0000 mg | ORAL_TABLET | Freq: Two times a day (BID) | ORAL | Status: DC
  Administered 2022-06-30 – 2022-07-06 (×13): 400 mg via ORAL
  Filled 2022-06-30 (×15): qty 1

## 2022-06-30 MED ORDER — SALINE SPRAY 0.65 % NA SOLN
1.0000 | NASAL | Status: DC | PRN
  Filled 2022-06-30: qty 44

## 2022-06-30 MED ORDER — GUAIFENESIN ER 600 MG PO TB12
600.0000 mg | ORAL_TABLET | Freq: Two times a day (BID) | ORAL | Status: DC
  Administered 2022-06-30 – 2022-07-06 (×13): 600 mg via ORAL
  Filled 2022-06-30 (×13): qty 1

## 2022-06-30 MED ORDER — AMIODARONE HCL 200 MG PO TABS
200.0000 mg | ORAL_TABLET | Freq: Every day | ORAL | Status: DC
  Administered 2022-06-30 – 2022-07-01 (×2): 200 mg via ORAL
  Filled 2022-06-30 (×2): qty 1

## 2022-06-30 MED ORDER — POTASSIUM CHLORIDE CRYS ER 20 MEQ PO TBCR
40.0000 meq | EXTENDED_RELEASE_TABLET | ORAL | Status: AC
  Administered 2022-06-30 (×2): 40 meq via ORAL
  Filled 2022-06-30 (×2): qty 2

## 2022-06-30 MED ORDER — FLUTICASONE PROPIONATE 50 MCG/ACT NA SUSP
1.0000 | Freq: Every day | NASAL | Status: DC | PRN
  Administered 2022-06-30 – 2022-07-03 (×2): 1 via NASAL
  Filled 2022-06-30 (×2): qty 16

## 2022-06-30 MED ORDER — AZITHROMYCIN 250 MG PO TABS
500.0000 mg | ORAL_TABLET | Freq: Every day | ORAL | Status: DC
  Administered 2022-07-01: 500 mg via ORAL
  Filled 2022-06-30: qty 2

## 2022-06-30 NOTE — Plan of Care (Signed)
  Problem: Education: Goal: Knowledge of General Education information will improve Description Including pain rating scale, medication(s)/side effects and non-pharmacologic comfort measures Outcome: Progressing   Problem: Health Behavior/Discharge Planning: Goal: Ability to manage health-related needs will improve Outcome: Progressing   

## 2022-06-30 NOTE — Progress Notes (Signed)
PROGRESS NOTE    Travis [name]  [name] DOB: 12-28-1950 DOA: 06/29/2022 PCP: [name], MD     Brief Narrative:   OSA on CPAP, multiple myeloma currently on oral chemotherapy and radiation therapy, who has known atrial fibrillation on Eliquis, his anticoagulation was recently held at the beginning of February due to pathologic thoracic spine fracture, requiring posterior lateral arthrodesis/fusion and corpectomy for tumor resection with Dr. Christella Noa on 2/2. Recently discharged on 2/20 with new DVT left peroneal vein who presents with shortness of breath and hypoxia.    Subjective:  Reports received lasix yesterday, bilateral lower extremity edema has improved Dry cough, denies chest pain, reports feeling sob mostly at night initially, then it got worse Denies fever  K is low   Assessment & Plan:  Principal Problem:   Acute hypoxic respiratory failure (HCC) Active Problems:   Hypertension   Atrial fibrillation with RVR (HCC)   OSA (obstructive sleep apnea)   Smoldering multiple myeloma   History of DVT (deep vein thrombosis)   CAP (community acquired pneumonia)   Hypokalemia   Hyponatremia   History of thoracic surgery    Assessment and Plan:   * Acute hypoxic respiratory failure ([place]), not on home O2 at baseline --CTA chest negative for PE with multifocal bilateral airspace disease consistent with bilateral pneumonia. Small bilateral pleural effusions. -clinically not fever, no Leukocytosis, does not appear septic, does has volume overload on examination, will continue IV Lasix as long as BP and renal function allows, as cannot rule out acute on chronic diastolic CHF, suspect uncontrolled A-fib RVR could contribute to acute heart failure -continue empiric IV Rocephin and Azithromcyin , check urine strep pneumo antigen and procalcitonin level -Daily weight, follow intake and output -wean O2 as tolerated with goal of >92%   Atrial fibrillation with  RVR (HCC) - Likely from ongoing pneumonia -Rates are going from 110-130 and became more sustained at 120 despite low dose Lopressor.  -will start IV diltiazem infusion  -continue home amiodarone   Hypovolemic hyponatremia Sodium 133 on presentation, sodium normalized today  Hypokalemia K remain low, continue replace, recheck in the morning, check magnesium as well  Hypertension -continue home antihypertensives  -holding nighttime Hydralazine since he was given IV Lasix in ED  Recent history of pathologic thoracic spine fracture s/p posterior lateral arthrodesis/fusion and corpectomy on 06/04/22 with neurosurgery  -recommend ambulation and OOB daily   History of DVT (deep vein thrombosis) DX 06/21/22 following discontinuation of anticoagulation for recent spinal surgery -will complete 7 days of higher dose Eliquis tonight. Resume '5mg'$  BID tomorrow for both DVT and A.fib  Smoldering multiple myeloma -Cycle 1, Day 1 of Velcade/Pom/Dex 06/23/22 -Follows with oncologist Dr. Lorenso Courier -acyclovir '400mg'$  PO BID for VCZ prophylaxis   OSA (obstructive sleep apnea) -continue nightly CPAP       I have Reviewed nursing notes, Vitals, pain scores, I/o's, Lab results and  imaging results since pt's last encounter, details please see discussion above  I ordered the following labs:  Unresulted Labs (From admission, onward)     Start     Ordered   07/01/22 [id number]  Basic metabolic panel  Daily,   R      06/30/22 1215   07/01/22 0500  Magnesium  Tomorrow morning,   R        06/30/22 1215   07/01/22 0500  Phosphorus  Tomorrow morning,   R        06/30/22 1215   07/01/22 0500  Procalcitonin  Daily,   R     References:    Procalcitonin Lower Respiratory Tract Infection AND Sepsis Procalcitonin Algorithm   06/30/22 1235   06/30/22 1223  Culture, blood (Routine X 2) w Reflex to ID Panel  BLOOD CULTURE X 2,   R (with TIMED occurrences)      06/30/22 1222   06/30/22 1205  Strep pneumoniae urinary  antigen  Once,   R        06/30/22 1204             DVT prophylaxis:  apixaban (ELIQUIS) tablet 5 mg   Code Status:   Code Status: Full Code  Family Communication: Patient Disposition:    Dispo: The patient is from: Home              Anticipated d/c is to: Home              Anticipated d/c date is: >24hrs, need to wean off oxygen  Antimicrobials:   Anti-infectives (From admission, onward)    Start     Dose/Rate Route Frequency Ordered Stop   07/01/22 1000  azithromycin (ZITHROMAX) tablet 500 mg        500 mg Oral Daily 06/30/22 1234 07/05/22 0959   06/30/22 1800  cefTRIAXone (ROCEPHIN) 1 g in sodium chloride 0.9 % 100 mL IVPB        1 g 200 mL/hr over 30 Minutes Intravenous Every 24 hours 06/29/22 1933     06/30/22 1000  acyclovir (ZOVIRAX) tablet 400 mg        400 mg Oral 2 times daily 06/30/22 0024     06/30/22 0600  azithromycin (ZITHROMAX) 500 mg in sodium chloride 0.9 % 250 mL IVPB  Status:  Discontinued        500 mg 250 mL/hr over 60 Minutes Intravenous Every 24 hours 06/29/22 1933 06/30/22 1234   06/29/22 1830  cefTRIAXone (ROCEPHIN) 1 g in sodium chloride 0.9 % 100 mL IVPB        1 g 200 mL/hr over 30 Minutes Intravenous  Once 06/29/22 1822 06/29/22 1910   06/29/22 1830  doxycycline (VIBRA-TABS) tablet 100 mg        100 mg Oral  Once 06/29/22 1822 06/29/22 1840           Objective: Vitals:   06/30/22 0308 06/30/22 0327 06/30/22 0600 06/30/22 1257  BP:  126/71 126/72 (!) 152/68  Pulse:    97  Resp: (!) 27 (!) 22 (!) 22 20  Temp:  99.1 F (37.3 C)  99.2 F (37.3 C)  TempSrc:  Axillary  Oral  SpO2: 95% 94% 93% 90%    Intake/Output Summary (Last 24 hours) at 06/30/2022 1803 Last data filed at 06/30/2022 1614 Gross per 24 hour  Intake 800 ml  Output 1925 ml  Net -1125 ml   There were no vitals filed for this visit.  Examination:  General exam: AAOx3,  chronically ill-appearing Respiratory system: .  Diminished,, mild rales at bases, no  wheezing, no rhonchi, respiratory effort normal. Cardiovascular system:  IRRR.  Gastrointestinal system: Abdomen is nondistended, soft and nontender.  Normal bowel sounds heard. Central nervous system: Alert and oriented. No focal neurological deficits. Extremities: Trace bilateral lower extremity pitting edema Skin: No rashes, lesions or ulcers Psychiatry: Judgement and insight appear normal. Mood & affect appropriate.     Data Reviewed: I have personally reviewed  labs and visualized  imaging studies since the last encounter and formulate the  plan        Scheduled Meds:  acyclovir  400 mg Oral BID   amiodarone  200 mg Oral Daily   apixaban  5 mg Oral BID   [START ON 07/01/2022] azithromycin  500 mg Oral Daily   guaiFENesin  600 mg Oral BID   potassium chloride  40 mEq Oral Q4H   Continuous Infusions:  cefTRIAXone (ROCEPHIN)  IV     diltiazem (CARDIZEM) infusion 15 mg/hr (06/30/22 1612)     LOS: 1 day   Time spent:  44mns  FFlorencia Reasons MD PhD FACP [facility]  Available via Epic secure chat 7am-7pm for nonurgent issues Please page for urgent issues To page the attending provider between 7A-7P or the covering provider during after hours 7P-7A, please log into the web site [email or web address] and access using universal [place] password for that web site. If you do not have the password, please call the hospital operator.    06/30/2022, 6:03 PM

## 2022-06-30 NOTE — Telephone Encounter (Signed)
Wife called in to report patient being admitted at [facility] for pneumonia. Wife requesting to reschedule CT sim appointment on 07/01/22. Requesting call back at [phone].

## 2022-06-30 NOTE — Progress Notes (Signed)
Mobility Specialist - Progress Note  Pre-mobility: 92 bpm HR, 94% SpO2 Post-mobility: 100 bpm HR, 91% SPO2   06/30/22 1018  Mobility  Activity Ambulated with assistance in hallway  Level of Assistance Minimal assist, patient does 75% or more  Assistive Device Front wheel walker  Distance Ambulated (ft) 5 ft  Range of Motion/Exercises Active  Activity Response Tolerated fair  $Mobility charge 1 Mobility   Pt was found in bed and agreeable to try ambulation. Once standing stated feeling a little dizzy after a couple minutes stated it was still persistent but wanting to ambulate. Able to ambulate just outside door before turning back. SPO2 checked upon returning to room to be 86% and 106 bpm HR. Pt was min-A to get back in bed and was left with all necessities in reach.  Ferd Hibbs Mobility Specialist

## 2022-06-30 NOTE — TOC Initial Note (Signed)
Transition of Care [facility]) - Initial/Assessment Note    Patient Details  Name: Travis [name] MRN: [id number] Date of Birth: [birth year]  [facility]) CM/SW Contact:    Henrietta Dine, RN Phone Number: 06/30/2022, 4:05 PM  Clinical Narrative:                 [facility] consult for d/c planning; spoke w/ pt in room; pt says he is from home and plans to return at d/c; he identified POC wife Serapio Gelinas [phone]) pt denies IPV, food insecurity, or difficulty paying utilities; he says he has transportation; pt says he has glasses; pt says he does not have HA or dentures; he says he has cane, walker, HHPT/OT w/ [id number]; pt says he would like to resume services at discharge; pt says he does not have home oxygen; TOC will follow for needs.  Expected Discharge Plan: [place] Barriers to Discharge: Continued Medical Work up   Patient Goals and CMS Choice Patient states their goals for this hospitalization and ongoing recovery are:: home          Expected Discharge Plan and Services   Discharge Planning Services: CM Consult Post Acute Care Choice: Resumption of Svcs/PTA Provider, Durable Medical Equipment, Home Health (cane, walker, HHPT/OT w/ [id number]; would like to resume services at discharge) Living arrangements for the past 2 months: Apartment                                      Prior Living Arrangements/Services Living arrangements for the past 2 months: Apartment Lives with:: Spouse Patient language and need for interpreter reviewed:: Yes Do you feel safe going back to the place where you live?: Yes      Need for Family Participation in Patient Care: Yes (Comment) Care giver support system in place?: Yes (comment) Current home services: DME, Home PT, Home OT (cane, walker, HHPT/OT w/ [id number]) Criminal Activity/Legal Involvement Pertinent to Current Situation/Hospitalization: No - Comment as needed  Activities of Daily  Living Home Assistive Devices/Equipment: Walker (specify type) ADL Screening (condition at time of admission) Patient's cognitive ability adequate to safely complete daily activities?: Yes Is the patient deaf or have difficulty hearing?: No Does the patient have difficulty seeing, even when wearing glasses/contacts?: No Does the patient have difficulty concentrating, remembering, or making decisions?: No Patient able to express need for assistance with ADLs?: Yes Does the patient have difficulty dressing or bathing?: No Independently performs ADLs?: Yes (appropriate for developmental age) Does the patient have difficulty walking or climbing stairs?: No Weakness of Legs: Both Weakness of Arms/Hands: None  Permission Sought/Granted Permission sought to share information with : Case Manager Permission granted to share information with : Yes, Verbal Permission Granted  Share Information with NAME: Lenor Coffin, RN, CM     Permission granted to share info w Relationship: Enmanuel Raum (wife) [phone]     Emotional Assessment Appearance:: Appears stated age Attitude/Demeanor/Rapport: Gracious Affect (typically observed): Accepting Orientation: : Oriented to Self, Oriented to Place, Oriented to  Time, Oriented to Situation Alcohol / Substance Use: Not Applicable Psych Involvement: No (comment)  Admission diagnosis:  SOB (shortness of breath) [R06.02] Hypoxia [R09.02] Pneumonia of both lungs due to infectious organism, unspecified part of lung [J18.9] Acute hypoxic respiratory failure (HCC) [J96.01] Patient Active Problem List   Diagnosis Date Noted   Acute hypoxic respiratory failure ([place]) 06/29/2022  Hyponatremia 06/29/2022   History of thoracic surgery 06/29/2022   Acute DVT of left tibial vein ([place]) 06/21/2022   Hypokalemia 06/21/2022   Vitamin D deficiency 05/30/2022   Other constipation 05/29/2022   Pathologic compression fracture of spine, initial encounter ([place])  05/29/2022   Cord compression ([place]) 05/28/2022   Degenerative cervical disc 05/14/2022   Secondary hypercoagulable state ([place]) 01/11/2022   Multiple myeloma ([place]) 12/10/2021   Medication monitoring encounter 02/27/2021   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   Status post peripherally inserted central catheter (PICC) central line placement 11/06/2020   Drug rash with eosinophilia and systemic symptoms syndrome 11/06/2020   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   PAF (paroxysmal atrial fibrillation) (HCC)    GERD (gastroesophageal reflux disease) 03/29/2018   Hiatal hernia 03/29/2018   History of DVT (deep vein thrombosis) 03/29/2018   CAP (community acquired pneumonia) 03/29/2018   Osteoarthritis 03/29/2018   Neuropathy 08/29/2017   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   Paroxysmal atrial fibrillation ([place]) 04/05/2017   Prolapsed internal hemorrhoids, grade 4, s/p ligation/pexy/hemorrhoidectomy x 2 02/24/2017 02/24/2017   OSA (obstructive sleep apnea) 07/26/2016   Obesity (BMI 30-39.9) 07/26/2016   Atrial fibrillation with RVR ([place])    Hypertension    Chest pain at rest    PCP:  [name], MD Pharmacy:   [facility] [address] [place], [place]. AT [address] [place]. [place] [address] Phone: [phone] Fax: [phone]  [facility] [place] - [address] [place] [address] Phone: [phone] Fax: [phone]     Social Determinants of Health (SDOH) Social History: SDOH Screenings   Food Insecurity: No Food Insecurity (06/30/2022)  Housing: Low Risk  (06/30/2022)  Transportation Needs: No Transportation Needs (06/30/2022)  Utilities: Not At Risk (06/21/2022)  Depression (PHQ2-9): Low Risk  (10/16/2020)  Tobacco Use: Low Risk  (06/29/2022)   SDOH Interventions: Food Insecurity Interventions: Inpatient TOC Housing  Interventions: Inpatient TOC Transportation Interventions: Inpatient TOC Utilities Interventions: Inpatient TOC   Readmission Risk Interventions    06/09/2022   11:52 AM  Readmission Risk Prevention Plan  Transportation Screening Complete  PCP or Specialist Appt within 5-7 Days Complete  Home Care Screening Complete  Medication Review (RN CM) Complete

## 2022-07-01 ENCOUNTER — Other Ambulatory Visit: Payer: Self-pay

## 2022-07-01 ENCOUNTER — Ambulatory Visit: Payer: Medicare Other | Admitting: Radiation Oncology

## 2022-07-01 ENCOUNTER — Ambulatory Visit: Payer: Medicare Other

## 2022-07-01 ENCOUNTER — Inpatient Hospital Stay (HOSPITAL_COMMUNITY): Payer: Medicare Other

## 2022-07-01 DIAGNOSIS — J9601 Acute respiratory failure with hypoxia: Secondary | ICD-10-CM

## 2022-07-01 DIAGNOSIS — R918 Other nonspecific abnormal finding of lung field: Secondary | ICD-10-CM

## 2022-07-01 LAB — MRSA NEXT GEN BY PCR, NASAL: MRSA by PCR Next Gen: NOT DETECTED

## 2022-07-01 LAB — STREP PNEUMONIAE URINARY ANTIGEN: Strep Pneumo Urinary Antigen: NEGATIVE

## 2022-07-01 LAB — BASIC METABOLIC PANEL
Anion gap: 7 (ref 5–15)
BUN: 17 mg/dL (ref 8–23)
CO2: 24 mmol/L (ref 22–32)
Calcium: 7.7 mg/dL — ABNORMAL LOW (ref 8.9–10.3)
Chloride: 104 mmol/L (ref 98–111)
Creatinine, Ser: 0.72 mg/dL (ref 0.61–1.24)
GFR, Estimated: >60 mL/min (ref >60)
Glucose, Bld: 138 mg/dL — ABNORMAL HIGH (ref 70–99)
Potassium: 3.6 mmol/L (ref 3.5–5.1)
Sodium: 135 mmol/L (ref 135–145)

## 2022-07-01 LAB — PROCALCITONIN: Procalcitonin: <0.1 ng/mL

## 2022-07-01 LAB — PHOSPHORUS: Phosphorus: 2.7 mg/dL (ref 2.5–4.6)

## 2022-07-01 LAB — MAGNESIUM: Magnesium: 1.7 mg/dL (ref 1.7–2.4)

## 2022-07-01 MED ORDER — AMIODARONE HCL IN DEXTROSE 360-4.14 MG/200ML-% IV SOLN
30.0000 mg/h | INTRAVENOUS | Status: DC
  Administered 2022-07-01 – 2022-07-05 (×10): 30 mg/h via INTRAVENOUS
  Filled 2022-07-01 (×8): qty 200

## 2022-07-01 MED ORDER — POTASSIUM CHLORIDE CRYS ER 20 MEQ PO TBCR
40.0000 meq | EXTENDED_RELEASE_TABLET | Freq: Once | ORAL | Status: AC
  Administered 2022-07-01: 40 meq via ORAL
  Filled 2022-07-01: qty 2

## 2022-07-01 MED ORDER — AMIODARONE LOAD VIA INFUSION
150.0000 mg | Freq: Once | INTRAVENOUS | Status: AC
  Administered 2022-07-01: 150 mg via INTRAVENOUS
  Filled 2022-07-01: qty 83.34

## 2022-07-01 MED ORDER — METOPROLOL TARTRATE 25 MG PO TABS
25.0000 mg | ORAL_TABLET | Freq: Two times a day (BID) | ORAL | Status: DC
  Administered 2022-07-01 (×2): 25 mg via ORAL
  Filled 2022-07-01 (×2): qty 1

## 2022-07-01 MED ORDER — FUROSEMIDE 10 MG/ML IJ SOLN
40.0000 mg | Freq: Once | INTRAMUSCULAR | Status: AC
  Administered 2022-07-01: 40 mg via INTRAVENOUS
  Filled 2022-07-01: qty 4

## 2022-07-01 MED ORDER — DOXYCYCLINE HYCLATE 100 MG PO TABS
100.0000 mg | ORAL_TABLET | Freq: Two times a day (BID) | ORAL | Status: DC
  Administered 2022-07-01 – 2022-07-03 (×4): 100 mg via ORAL
  Filled 2022-07-01 (×4): qty 1

## 2022-07-01 MED ORDER — DOCUSATE SODIUM 100 MG PO CAPS: 100.0000 mg | ORAL_CAPSULE | Freq: Every day | ORAL | Status: DC | PRN

## 2022-07-01 MED ORDER — MAGNESIUM SULFATE IN D5W 1-5 GM/100ML-% IV SOLN
1.0000 g | Freq: Once | INTRAVENOUS | Status: AC
  Administered 2022-07-01: 1 g via INTRAVENOUS
  Filled 2022-07-01: qty 100

## 2022-07-01 MED ORDER — AMIODARONE HCL IN DEXTROSE 360-4.14 MG/200ML-% IV SOLN
60.0000 mg/h | INTRAVENOUS | Status: AC
  Administered 2022-07-01: 60 mg/h via INTRAVENOUS
  Filled 2022-07-01: qty 200

## 2022-07-01 MED ORDER — ORAL CARE MOUTH RINSE: 15.0000 mL | OROMUCOSAL | Status: DC | PRN

## 2022-07-01 MED ORDER — CHLORHEXIDINE GLUCONATE CLOTH 2 % EX PADS
6.0000 | MEDICATED_PAD | Freq: Every day | CUTANEOUS | Status: DC
  Administered 2022-07-01 – 2022-07-04 (×4): 6 via TOPICAL

## 2022-07-01 MED ORDER — METHYLPREDNISOLONE SODIUM SUCC 125 MG IJ SOLR
125.0000 mg | Freq: Two times a day (BID) | INTRAMUSCULAR | Status: DC
  Administered 2022-07-01 – 2022-07-02 (×2): 125 mg via INTRAVENOUS
  Filled 2022-07-01 (×2): qty 2

## 2022-07-01 NOTE — Progress Notes (Signed)
  Amiodarone Drug - Drug Interaction Consult Note  Recommendations: Azithromycin changed to doxycyline to avoid prolonged QTc  Amiodarone is metabolized by the cytochrome P450 system and therefore has the potential to cause many drug interactions. Amiodarone has an average plasma half-life of 50 days (range 20 to 100 days).   There is potential for drug interactions to occur several weeks or months after stopping treatment and the onset of drug interactions may be slow after initiating amiodarone.   []$  Statins: Increased risk of myopathy. Simvastatin- restrict dose to 17m daily. Other statins: counsel patients to report any muscle pain or weakness immediately.  []$  Anticoagulants: Amiodarone can increase anticoagulant effect. Consider warfarin dose reduction. Patients should be monitored closely and the dose of anticoagulant altered accordingly, remembering that amiodarone levels take several weeks to stabilize.  []$  Antiepileptics: Amiodarone can increase plasma concentration of phenytoin, the dose should be reduced. Note that small changes in phenytoin dose can result in large changes in levels. Monitor patient and counsel on signs of toxicity.  []$  Beta blockers: increased risk of bradycardia, AV block and myocardial depression. Sotalol - avoid concomitant use.  [x]$   Calcium channel blockers (diltiazem and verapamil): increased risk of bradycardia, AV block and myocardial depression.  []$   Cyclosporine: Amiodarone increases levels of cyclosporine. Reduced dose of cyclosporine is recommended.  []$  Digoxin dose should be halved when amiodarone is started.  []$  Diuretics: increased risk of cardiotoxicity if hypokalemia occurs.  []$  Oral hypoglycemic agents (glyburide, glipizide, glimepiride): increased risk of hypoglycemia. Patient's glucose levels should be monitored closely when initiating amiodarone therapy.   [x]$  Drugs that prolong the QT interval:  Torsades de pointes risk may be increased  with concurrent use - avoid if possible.  Monitor QTc, also keep magnesium/potassium WNL if concurrent therapy can't be avoided.  Antibiotics: e.g. fluoroquinolones, erythromycin.  Antiarrhythmics: e.g. quinidine, procainamide, disopyramide, sotalol.  Antipsychotics: e.g. phenothiazines, haloperidol.   Lithium, tricyclic antidepressants, and methadone. Thank You,   MEudelia Bunch Pharm.D Use secure chat for questions 07/01/2022 10:09 AM

## 2022-07-01 NOTE — Consult Note (Addendum)
Cardiology Consultation   Patient ID: TARIF MCMANAWAY MRN: [id number]; DOB: 05-02-1951  Admit date: 06/29/2022 Date of Consult: 07/01/2022  PCP:  [name], MD   [facility] Cardiologist:  [name], MD  Electrophysiologist:  Vickie Epley, MD  Sleep Medicine:  Fransico Him, MD       Patient Profile:   LABAN MCCLAUGHRY is a 72 y.o. male with a hx of HTN, HLD, OSA on CPAP, multiple myeloma on chemo/radiation therapy, persistent atrial fibrillation s/p ablation 2018, and recurrent DVT who is being seen 07/01/2022 for the evaluation of atrial fibrillation with RVR at the request of Dr. Erlinda Hong.  History of Present Illness:   Mr. Baechle is a 72 year old male with past medical history of HTN, HLD, OSA on CPAP, multiple myeloma on chemo/radiation therapy, persistent atrial fibrillation s/p ablation 2018, and recurrent DVT.  Coronary CT in 2018 showed no CAD, coronary calcium score of 0.  In the fall 2023, patient was hospitalized in [place] with tachycardia, atrial fibrillation and atrial flutter and volume overload.  He underwent diuresis.  He converted back to sinus rhythm.  Beta-blocker was stopped because he was bradycardic, then restarted later.  Echocardiogram was done as well.  He was seen in the ED again in November 2023 with A-fib and atypical chest pain.  Serial troponin was negative.  CTA showed no PE, however did show lytic lesions in the left sixth rib.  Unfortunately, patient was back in atrial flutter in November, flecainide was discontinued and he was started on oral amiodarone down titrating dose.  Most recently, patient was seen by Dr. Quentin Ore in January 2024, he was back in sinus rhythm.  Amiodarone was decreased to 200 mg daily.  Since last cardiology visit, patient has been readmitted to the hospital near the end of January 2024 due to increased pain of posterior neck and shoulder.  MRI confirmed T2 tumor and pathologic fracture.  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  He was also on chemotherapy at the time.  Patient did undergo posterior lateral arthrodesis/fusion and thoracic corpectomy for tumor resection by Dr. Christella Noa on 06/04/2022.  Eliquis was temporarily held during perioperative period.  Patient was readmitted to the hospital on 06/21/2022 after a fall and was found to be in A-fib with RVR on arrival.  Venous Doppler obtained on 06/21/2022 showed DVT of left peroneal vein.  Patient was placed on DVT dosing of Eliquis.  Echocardiogram obtained on 06/22/2022 demonstrated EF 60 to 65%, grade 2 DD.  According to echo report, patient appears to be in sinus rhythm at the time of the echo.  He was instructed to follow-up with cardiology service as outpatient.  Patient was in his usual state of health until Tuesday, 06/29/2022 when he started noticing worsening dyspnea.  His PT performed a pulse oximetry on him and found him to be hypoxic at 84% on room air.  He never required any oxygen before.  EMS was called and placed the patient on 3 L nasal cannula.  CTA of the chest showed no evidence of PE, however did reveal multifocal bilateral pneumonia, small bilateral pleural effusion, stable lytic lesion within the spine and the thoracic cage consistent with known multiple myeloma.  On arrival, patient was in A-fib/atrial flutter with RVR.  Home Cardizem was held.  He was started on IV diltiazem.  Talking with the patient, he does feel the palpitation.  Past Medical History:  Diagnosis Date  A-fib [facility])    Arthritis    "comes and goes; mostly in hands, occasionally elbow" (0000000)   Complication of anesthesia    "just wanted to sleep alot  for couple days S/P VARICOCELE OR" (04/05/2017)   DVT (deep venous thrombosis) (HCC)    LLE   Dysrhythmia    PVCs    GERD (gastroesophageal reflux disease)    History of hiatal hernia    Hypertension    Impaired glucose tolerance    Multiple myeloma  (HCC)    Obesity (BMI 30-39.9) 07/26/2016   OSA on CPAP 07/26/2016   Mild with AHI 12.5/hr now on CPAP at 14cm H2O   PAF (paroxysmal atrial fibrillation) ([place])    s/p PVI Ablation in 2018 // Flecainide Rx // Echo 8/21: EF 60-65, no RWMA, mild LVH, normal RV SF, moderate RVE, mild BAE, trivial MR, mild dilation of aortic root (40 mm)   Pneumonia    "may have had walking pneumonia a few years ago" (04/05/2017)   Pre-diabetes    Thrombophlebitis     Past Surgical History:  Procedure Laterality Date   ATRIAL FIBRILLATION ABLATION  04/05/2017   ATRIAL FIBRILLATION ABLATION N/A 04/05/2017   Procedure: ATRIAL FIBRILLATION ABLATION;  Surgeon: Thompson Grayer, MD;  Location: [facility];  Service: Cardiovascular;  Laterality: N/A;   BONE BIOPSY Right 12/17/2021   Procedure: BONE BIOPSY;  Surgeon: Hiram Gash, MD;  Location: [place];  Service: Orthopedics;  Laterality: Right;   COMPLETE RIGHT HIP REPLACMENT  01/19/2019   EVALUATION UNDER ANESTHESIA WITH HEMORRHOIDECTOMY N/A 02/24/2017   Procedure: EXAM UNDER ANESTHESIA WITH  HEMORRHOIDECTOMY;  Surgeon: Michael Boston, MD;  Location: [facility];  Service: General;  Laterality: N/A;   EXCISIONAL TOTAL HIP ARTHROPLASTY WITH ANTIBIOTIC SPACERS Right 09/25/2020   Procedure: EXCISIONAL TOTAL HIP ARTHROPLASTY WITH ANTIBIOTIC SPACERS;  Surgeon: Paralee Cancel, MD;  Location: [facility];  Service: Orthopedics;  Laterality: Right;   FLEXIBLE SIGMOIDOSCOPY N/A 04/15/2015   Procedure:  UNSEDATED FLEXIBLE SIGMOIDOSCOPY;  Surgeon: [name], MD;  Location: [facility];  Service: Endoscopy;  Laterality: N/A;   HUMERUS IM NAIL Right 12/17/2021   Procedure: INTRAMEDULLARY (IM) NAIL HUMERAL;  Surgeon: Hiram Gash, MD;  Location: [place];  Service: Orthopedics;  Laterality: Right;   KNEE ARTHROSCOPY Left ~ 2016   POSTERIOR CERVICAL FUSION/FORAMINOTOMY N/A 06/04/2022   Procedure: Thoracic one - Thoracic Four Posterior lateral  arthrodesis/ fusion and Thoracic two Corpectomy;  Surgeon: Ashok Pall, MD;  Location: [place];  Service: Neurosurgery;  Laterality: N/A;   REIMPLANTATION OF TOTAL HIP Right 12/25/2020   Procedure: REIMPLANTATION/REVISION OF RIGHT TOTAL HIP VERSUS REPEAT IRRIGATION AND DEBRIDEMENT;  Surgeon: Paralee Cancel, MD;  Location: [facility];  Service: Orthopedics;  Laterality: Right;   TESTICLE SURGERY  1988   VARICOCELE   TONSILLECTOMY     VARICOSE VEIN SURGERY Bilateral      Home Medications:  Prior to Admission medications   Medication Sig Start Date End Date Taking? Authorizing Provider  acetaminophen (TYLENOL) 325 MG tablet Take 2 tablets (650 mg total) by mouth every 6 (six) hours as needed. Patient taking differently: Take 650 mg by mouth every 6 (six) hours as needed for mild pain or headache. 03/15/22  Yes Jeanell Sparrow, DO  acyclovir (ZOVIRAX) 400 MG tablet Take 1 tablet (400 mg total) by mouth 2 (two) times daily. 12/10/21  Yes Orson Slick, MD  amiodarone (PACERONE) 200 MG tablet Take 1 tablet (  200 mg total) by mouth daily. 05/12/22  Yes Vickie Epley, MD  amLODipine (NORVASC) 10 MG tablet Take 10 mg by mouth daily.   Yes [provider]  apixaban (ELIQUIS) 5 MG TABS tablet Take 1 tablet (5 mg total) by mouth 2 (two) times daily. 06/29/22  Yes Nita Sells, MD  cholecalciferol (VITAMIN D3) 25 MCG (1000 UNIT) tablet Take 1,000 Units by mouth daily.   Yes [provider]  Cyanocobalamin (VITAMIN B-12 PO) Take 1,000 mcg by mouth daily.   Yes [provider]  dexamethasone (DECADRON) 4 MG tablet Take 4 mg by mouth See admin instructions. Take 4 mg by mouth once a day for 3 days after chemo on Wednesday- or unless otherwise instructed   Yes [provider]  diltiazem (CARDIZEM CD) 240 MG 24 hr capsule Take 1 capsule (240 mg total) by mouth daily. 06/23/22  Yes Nita Sells, MD  fluticasone (FLONASE) 50 MCG/ACT nasal spray Place 1 spray into  both nostrils daily as needed for allergies or rhinitis.   Yes [provider]  folic acid (FOLVITE) 1 MG tablet Take 1 mg by mouth daily. 11/12/19  Yes [provider]  furosemide (LASIX) 40 MG tablet Take 40 mg by mouth daily as needed for edema.   Yes [provider]  gabapentin (NEURONTIN) 300 MG capsule TAKE TWO CAPSULES BY MOUTH TWICE A DAY Patient taking differently: Take 600 mg by mouth 2 (two) times daily. 03/29/22  Yes Patel, Donika K, DO  hydrALAZINE (APRESOLINE) 25 MG tablet Take 1.5 tablets (37.5 mg total) by mouth 3 (three) times daily. 05/12/22  Yes Vickie Epley, MD  HYDROcodone-acetaminophen (NORCO) 7.5-325 MG tablet Take 1 tablet by mouth every 6 (six) hours as needed for moderate pain. 06/18/22  Yes [provider]  lisinopril (ZESTRIL) 40 MG tablet TAKE ONE TABLET BY MOUTH ONE TIME DAILY Patient taking differently: Take 40 mg by mouth daily. 08/12/21  Yes Fay Records, MD  methocarbamol (ROBAXIN) 750 MG tablet Take 750 mg by mouth every 8 (eight) hours as needed for muscle spasms. 06/16/22  Yes [provider]  pomalidomide (POMALYST) 4 MG capsule Take 4 mg by mouth See admin instructions. Take 4 mg by mouth once a day for 21 days "on," then 7 days "off." Repeat every 28 days.   Yes [provider]  potassium chloride SA (KLOR-CON M) 20 MEQ tablet Take 2 tablets (40 mEq total) by mouth daily. 06/22/22  Yes Nita Sells, MD  sodium chloride (OCEAN) 0.65 % SOLN nasal spray Place 1 spray into both nostrils as needed for congestion.   Yes [provider]  traMADol (ULTRAM) 50 MG tablet Take 1 tablet (50 mg total) by mouth every 6 (six) hours as needed. Patient taking differently: Take 50 mg by mouth every 6 (six) hours as needed for moderate pain. 05/18/22  Yes Dede Query T, PA-C  apixaban (ELIQUIS) 5 MG TABS tablet Take 2 tablets (10 mg total) by mouth 2 (two) times daily for 7 days. Patient not taking: Reported  on 06/30/2022 06/22/22 06/30/22  Nita Sells, MD  Omega-3 Fatty Acids (FISH OIL PO) Take 1 Capful by mouth daily. Patient not taking: Reported on 06/30/2022    [provider]    Inpatient Medications: Scheduled Meds:  acyclovir  400 mg Oral BID   amiodarone  200 mg Oral Daily   apixaban  5 mg Oral BID   azithromycin  500 mg Oral Daily   guaiFENesin  600 mg Oral BID   potassium chloride  40 mEq Oral Once   Continuous Infusions:  cefTRIAXone (ROCEPHIN)  IV 1 g (06/30/22 1858)   diltiazem (CARDIZEM) infusion 15 mg/hr (06/30/22 2351)   magnesium sulfate bolus IVPB     PRN Meds: acetaminophen, fluticasone, sodium chloride  Allergies:    Allergies  Allergen Reactions   Linezolid Other (See Comments)    Burning tongue    Vancomycin Rash    Social History:   Social History   Socioeconomic History   Marital status: Married    Spouse name: Not on file   Number of children: 1   Years of education: law school   Highest education level: Not on file  Occupational History   Occupation: lawyer  Tobacco Use   Smoking status: Never   Smokeless tobacco: Never  Vaping Use   Vaping Use: Never used  Substance and Sexual Activity   Alcohol use: Yes    Alcohol/week: 2.0 standard drinks of alcohol    Types: 2 Glasses of wine per week    Comment: 2 glasses of wine a week, occ bourbon, beer   Drug use: No   Sexual activity: Not Currently  Other Topics Concern   Not on file  Social History Narrative   Lives in [place] with spouse in a 2 story home.  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    Social Determinants of Health   Financial Resource Strain: Not on file  Food Insecurity: No Food Insecurity (06/30/2022)   Hunger Vital Sign    Worried About Running Out of Food in the Last Year: Never true    Ran Out of Food in the Last Year: Never true  Transportation Needs: No Transportation Needs (06/30/2022)    PRAPARE - Hydrologist (Medical): No    Lack of Transportation (Non-Medical): No  Physical Activity: Not on file  Stress: Not on file  Social Connections: Not on file  Intimate Partner Violence: Not At Risk (06/30/2022)   Humiliation, Afraid, Rape, and Kick questionnaire    Fear of Current or Ex-Partner: No    Emotionally Abused: No    Physically Abused: No    Sexually Abused: No    Family History:    Family History  Problem Relation Age of Onset   Dementia Mother    Stroke Father    Atrial fibrillation Father    Hypertension Father    Hypertension Paternal Grandfather    Dementia Maternal Grandmother      ROS:  Please see the history of present illness.   All other ROS reviewed and negative.     Physical Exam/Data:   Vitals:   07/01/22 0100 07/01/22 0300 07/01/22 0417 07/01/22 0500  BP: (!) 149/76 (!) 164/86 132/75   Pulse: 77 (!) 56    Resp: (!) 27 (!) 26 (!) 24   Temp:   98.6 F (37 C)   TempSrc:   Oral   SpO2: 97% 95%    Weight:    108 kg    Intake/Output Summary (Last 24 hours) at 07/01/2022 0818 Last data filed at 07/01/2022 0711 Gross per 24 hour  Intake 338 ml  Output 2225 ml  Net -1887 ml      07/01/2022    5:00 AM 06/28/2022    1:07 PM 06/23/2022   10:56 AM  Last 3 Weights  Weight (lbs) 238  lb 1.6 oz 241 lb 9.6 oz 244 lb 14.4 oz  Weight (kg) 108 kg 109.589 kg 111.086 kg     Body mass index is 34.16 kg/m.  General:  Well nourished, well developed, in no acute distress HEENT: normal Neck: no JVD Vascular: No carotid bruits; Distal pulses 2+ bilaterally Cardiac: Tachycardic, irregular; no murmur  Lungs:  clear to auscultation bilaterally, no wheezing, rhonchi or rales  Abd: soft, nontender, no hepatomegaly  Ext: no edema Musculoskeletal:  No deformities, BUE and BLE strength normal and equal Skin: warm and dry  Neuro:  CNs 2-12 intact, no focal abnormalities noted Psych:  Normal affect   EKG:  The EKG was  personally reviewed and demonstrates: Atrial flutter with RVR Telemetry:  Telemetry was personally reviewed and demonstrates: Atrial fibrillation and atrial flutter with heart rate in the 90s to 110s overnight, frequent peaks into the 140s range  Relevant CV Studies:  Echo 06/22/2022  1. Appears to be in sinus rhythm at the time of evaluation- A waves  present on diastology. Left ventricular ejection fraction, by estimation,  is 60 to 65%. The left ventricle has normal function. The left ventricle  has no regional wall motion  abnormalities. There is mild concentric left ventricular hypertrophy. Left  ventricular diastolic parameters are consistent with Grade II diastolic  dysfunction (pseudonormalization).   2. Right ventricular systolic function is normal. The right ventricular  size is normal. There is normal pulmonary artery systolic pressure.   3. The mitral valve is grossly normal. No evidence of mitral valve  regurgitation.   4. The aortic valve is tricuspid. Aortic valve regurgitation is not  visualized. No aortic stenosis is present.   5. The inferior vena cava is normal in size with greater than 50%  respiratory variability, suggesting right atrial pressure of 3 mmHg.    Laboratory Data:  High Sensitivity Troponin:  No results for input(s): "TROPONINIHS" in the last 720 hours.   Chemistry Recent Labs  Lab 06/29/22 1512 06/30/22 0402 07/01/22 0348  NA 133* 136 135  K 3.2* 3.1* 3.6  CL 102 104 104  CO2 '24 25 24  '$ GLUCOSE 174* 117* 138*  BUN '17 16 17  '$ CREATININE 0.79 0.79 0.72  CALCIUM 8.0* 7.4* 7.7*  MG  --   --  1.7  GFRNONAA >60 >60 >60  ANIONGAP '7 7 7    '$ Recent Labs  Lab 06/29/22 1512  PROT 5.2*  ALBUMIN 2.4*  AST 19  ALT 24  ALKPHOS 78  BILITOT 0.7   Lipids No results for input(s): "CHOL", "TRIG", "HDL", "LABVLDL", "LDLCALC", "CHOLHDL" in the last 168 hours.  Hematology Recent Labs  Lab 06/29/22 1512 06/30/22 0402  WBC 9.0 7.6  RBC 3.04* 3.04*   HGB 9.7* 10.0*  HCT 29.8* 29.7*  MCV 98.0 97.7  MCH 31.9 32.9  MCHC 32.6 33.7  RDW 16.3* 16.4*  PLT 231 211   Thyroid No results for input(s): "TSH", "FREET4" in the last 168 hours.  BNP Recent Labs  Lab 06/29/22 1512  BNP 785.8*    DDimer No results for input(s): "DDIMER" in the last 168 hours.   Radiology/Studies:  CT T-SPINE NO CHARGE  Result Date: 06/29/2022 CLINICAL DATA:  Hypoxia, dyspnea, multiple myeloma EXAM: CT THORACIC SPINE WITHOUT CONTRAST TECHNIQUE: Multidetector CT images of the thoracic were obtained using the standard protocol without intravenous contrast. RADIATION DOSE REDUCTION: This exam was performed according to the departmental dose-optimization program which includes automated exposure control, adjustment of the mA and/or  kV according to patient size and/or use of iterative reconstruction technique. COMPARISON:  06/03/2022, 05/29/2022 FINDINGS: Alignment: Exaggerated kyphosis centered at the T2 level related to prior corpectomy, spacer placement, and T1-T4 posterior fusion. Otherwise alignment is anatomic. Vertebrae: There are stable lytic lesions within the T8 and T10 vertebral bodies, T11 spinous process, and left L1 and L2 pedicles compatible with known multiple myeloma. Expansile lytic lesions within the right posterior sixth and eighth ribs are stable. There are no acute displaced fractures. Interval T2 corpectomy with spacer placement, as well as posterior fusion spanning T1-T4. Orthopedic hardware is unremarkable. Paraspinal and other soft tissues: Widespread bilateral airspace disease and small bilateral pleural effusions are noted. Mediastinal and hilar lymphadenopathy. Paraspinal soft tissues are otherwise unremarkable. Disc levels: There is no significant bony encroachment upon the central canal or neural foramina. Disc spaces are relatively well preserved. IMPRESSION: 1. Stable lytic lesions within the thoracolumbar spine and thoracic cage, consistent with  known multiple myeloma. 2. Interval postsurgical changes from T2 corpectomy, spacer placement, and T1-T4 posterior fusion. 3. No acute fractures. 4. Please see separately reported CT chest exam, describing multifocal bilateral pneumonia. Electronically Signed   By: Randa Ngo M.D.   On: 06/29/2022 16:51   CT Angio Chest PE W/Cm &/Or Wo Cm  Result Date: 06/29/2022 CLINICAL DATA:  Dyspnea, hypoxia, recently diagnosed DVT, history of multiple myeloma EXAM: CT ANGIOGRAPHY CHEST WITH CONTRAST TECHNIQUE: Multidetector CT imaging of the chest was performed using the standard protocol during bolus administration of intravenous contrast. Multiplanar CT image reconstructions and MIPs were obtained to evaluate the vascular anatomy. RADIATION DOSE REDUCTION: This exam was performed according to the departmental dose-optimization program which includes automated exposure control, adjustment of the mA and/or kV according to patient size and/or use of iterative reconstruction technique. CONTRAST:  52m OMNIPAQUE IOHEXOL 350 MG/ML SOLN COMPARISON:  03/15/2022, 06/03/2022 FINDINGS: Cardiovascular: This is a technically adequate evaluation of the pulmonary vasculature. No filling defects or pulmonary emboli. Mild cardiomegaly without pericardial effusion. No evidence of thoracic aortic aneurysm or dissection. Mediastinum/Nodes: Mediastinal and hilar adenopathy identified. Index lymph node in the subcarinal region measures 16 mm. Thyroid, trachea, and esophagus are unremarkable. Lungs/Pleura: The widespread bilateral airspace disease seen on earlier chest x-ray is again noted, most pronounced within the right upper lobe. There are small bilateral pleural effusions, right greater than left. No pneumothorax. The central airways are patent. Upper Abdomen: No acute abnormality. Musculoskeletal: Lytic lesions within the right posterior sixth rib, T11 spinous process, and left L1 pedicle are stable, consistent with known multiple  myeloma. Interval corpectomy and spacer placement at the T2 level, with posterior fusion spanning T1 through T4. Reconstructed images demonstrate no additional findings. Review of the MIP images confirms the above findings. IMPRESSION: 1. No evidence of pulmonary embolus. 2. Multifocal bilateral airspace disease consistent with bilateral pneumonia. 3. Small bilateral pleural effusions. 4. Mediastinal and hilar adenopathy, likely reactive. 5. Stable lytic lesions within the spine and thoracic cage consistent with known multiple myeloma. 6. Interval postsurgical changes at the T2 level from prior corpectomy and spacer placement. Electronically Signed   By: MRanda NgoM.D.   On: 06/29/2022 16:47   DG Chest 2 View  Result Date: 06/29/2022 CLINICAL DATA:  Shortness of breath beginning this morning. EXAM: CHEST - 2 VIEW COMPARISON:  Chest radiographs 06/21/2022 in 03/10/2022 FINDINGS: Cardiac silhouette and mediastinal contours are partially obscured by overlying lung opacities are grossly unremarkable. There are dense heterogeneous airspace opacities which in the right mid lung  and left mid and lower lung that are new from 06/21/2022 most recent comparison. Minimal posterior costophrenic angle blunting on lateral view, tiny pleural effusion at least on the left side and possibly the right side. No pneumothorax is seen. Moderate multilevel degenerative disc changes of the thoracic spine. Lower cervical and upper thoracic partially visualized rod and screw fusion hardware there is again noted. IMPRESSION: Dense heterogeneous airspace opacities which in the right mid lung and left mid and lower lung that are new from 06/21/2022 most recent comparison. Findings are suspicious for multifocal pneumonia. Electronically Signed   By: Yvonne Kendall M.D.   On: 06/29/2022 14:59     Assessment and Plan:   Persistent atrial fibrillation with RVR  -On 240 mg Cardizem CD at home, oral Cardizem has been switched to IV  diltiazem in the hospital due to RVR.  IV diltiazem is currently at the highest dose.  Also on amiodarone 200 mg daily.  Option in this case is either at discharge of beta-blocker or switch amiodarone to IV.  -Will discuss with MD, likely will start on 25 mg twice a day of metoprolol tartrate on top of IV diltiazem for better rate control.  Previous office note mentioned bradycardia in fall 2023 while admitted in [place].  Will monitor heart rate closely.  Bilateral pneumonia: On ceftriaxone antibiotic, managed by primary team  Recurrent DVT: Recently underwent back surgery and anticoagulation was temporarily held. Venous Doppler obtained on 06/21/2022 revealed a left peroneal vein DVT.  Patient also has prior history of DVT as well.  He was started on DVT dosing of Eliquis, Eliquis has been reduced to 5 mg twice a day dosing after the first week.  Came in with shortness of breath, CTA of the chest shows no PE but does reveal bilateral pneumonia  Smoldering multiple myeloma: Provide lytic lesion of thoracic spine.  Recently underwent surgery for pathologic fracture on 06/04/2022.  Hypertension: Home oral Cardizem has been switched to IV diltiazem.  Home hydralazine, lisinopril and amlodipine currently on hold.  Hyperlipidemia    Risk Assessment/Risk Scores:          CHA2DS2-VASc Score = 2   This indicates a 2.2% annual risk of stroke. The patient's score is based upon: CHF History: 0 HTN History: 1 Diabetes History: 0 Stroke History: 0 Vascular Disease History: 0 Age Score: 1 Gender Score: 0         For questions or updates, please contact [place] Please consult [email or web address] for contact info under    Signed, Almyra Deforest, [place]  07/01/2022 8:18 AM  Patient seen and examined   I agree with findings as noted by Janan Ridge above   patient very familiar to me from [facility]   I spoke to him this weekend for afib with RVR Currently admitted for increased SOB, Found to have  bilateral infiltrates on CXR    On admit he was in afib with controlled rates, now with afib with RVR ON exam, he appears uncomfortable   FLushed  Neck is full Lungs relatively clear Cardia exam  Irreg irreg  No S3  No significant murmurs  ABd is benign  No hepatomegaly Ext are without edema  Warm  Impression  PAF  Pt in afib now   On review of echo done on 06/22/22 he ws in Sinus rhythm (after anticoagulation was restarted)   Given this I would push to get patient back into SR with amiodarone    He probably was  in SR last week and reverted this weekend OK to use metoprolol while amiodarone IV used    Taper dilt as HR tolerates  Pulmonary   I would repeat CXR   Pt looks flushed, uncomfortable   HR higher         WIll continue to follow

## 2022-07-01 NOTE — Consult Note (Signed)
NAME:  Travis [name], MRN:  [id number], DOB:  1950/09/06, LOS: 2 ADMISSION DATE:  06/29/2022, CONSULTATION DATE:  07/01/2022 REFERRING MD:  Dr. Erlinda Hong, [facility], CHIEF COMPLAINT:  Short of breath   History of Present Illness:  72 yo male with metastatic multiple myeloma on chemotherapy presented to [facility] ER with dry cough, dyspnea, and leg swelling.  SpO2 83% on room air.  Found to have pulmonary infiltrates concerning for pneumonia.  Started on supplemental oxygen, antibiotics, and lasix.    Pertinent  Medical History  A fib, Arthritis, DVT, GERD, Hiatal hernia, HTN, Multiple myeloma, OSA, Peripheral neuropathy, Cervical spinal stenosis  Significant Hospital Events: Including procedures, antibiotic start and stop dates in addition to other pertinent events   2/27 Admit, start ABx 2/29 cardiology consulted  Studies:  CT angio chest 06/29/22 >> widespread b/l ASD most in RUL, small effusions, lytic lesions in Rt 6th rib/T11 spinous process/Lt L1 pedicle  Interim History / Subjective:  He has myeloma.  He recently had chemo regimen changed.  CXR from 06/21/22 was unremarkable.  He did start having a cough before starting new chemotherapy, but wasn't having trouble with his breathing.  He wasn't aware of having fever.  No skin rash.  His leg swelling from DVT has improved.  Objective   Blood pressure 116/68, pulse 95, temperature 98.8 F (37.1 C), resp. rate (!) 29, weight 108 kg, SpO2 94 %.        Intake/Output Summary (Last 24 hours) at 07/01/2022 1430 Last data filed at 07/01/2022 1117 Gross per 24 hour  Intake 1255.67 ml  Output 1475 ml  Net -219.33 ml   Filed Weights   07/01/22 0500  Weight: 108 kg    Examination:  General - alert Eyes - pupils reactive ENT - no sinus tenderness, no stridor Cardiac - irregular Chest - faint bilateral crackles. Abdomen - soft, non tender, + bowel sounds Extremities - no cyanosis, clubbing, or edema Skin - no rashes Neuro - normal strength,  moves extremities, follows commands Psych - normal mood and behavior   Resolved Hospital Problem list     Assessment & Plan:   Acute hypoxic respiratory failure with bilateral pulmonary infiltrates. - clinical picture seems atypical for acute bacterial infection especially given appearance of chest imaging and negative procalcitonin - ? If he has pulmonary toxicity from medication >> he was started on velcade in February 2024, and he was started on amiodarone in December 2023 - will check respiratory viral panel - will start systemic steroids - continue antibiotics - might need bronchoscopy, but concerned about his ability to tolerate procedure given his respiratory status  IgA Kappa Multiple Myeloma. - changed to velcade, pomalyst, dexamethasone on 06/23/22  Persistent atrial fibrillation, HTN, HLD. - per primary team and cardiology - might need to consider alternative to amiodarone  Best Practice (right click and "Reselect all SmartList Selections" daily)   Diet/type: Regular consistency (see orders) DVT prophylaxis: DOAC GI prophylaxis: N/A Lines: N/A Foley:  N/A Code Status:  full code Last date of multidisciplinary goals of care discussion [updated pt's wife at bedside]  Labs   CBC: Recent Labs  Lab 06/29/22 1512 06/30/22 0402  WBC 9.0 7.6  HGB 9.7* 10.0*  HCT 29.8* 29.7*  MCV 98.0 97.7  PLT 231 [id number]    Basic Metabolic Panel: Recent Labs  Lab 06/29/22 1512 06/30/22 0402 07/01/22 0348  NA 133* 136 135  K 3.2* 3.1* 3.6  CL 102 104 104  CO2 24 25 24  GLUCOSE 174* 117* 138*  BUN '17 16 17  '$ CREATININE 0.79 0.79 0.72  CALCIUM 8.0* 7.4* 7.7*  MG  --   --  1.7  PHOS  --   --  2.7   GFR: Estimated Creatinine Clearance: 104.2 mL/min (by C-G formula based on SCr of 0.72 mg/dL). Recent Labs  Lab 06/29/22 1512 06/30/22 0402 07/01/22 0348  PROCALCITON  --   --  <0.10  WBC 9.0 7.6  --     Liver Function Tests: Recent Labs  Lab 06/29/22 1512  AST 19   ALT 24  ALKPHOS 78  BILITOT 0.7  PROT 5.2*  ALBUMIN 2.4*   No results for input(s): "LIPASE", "AMYLASE" in the last 168 hours. No results for input(s): "AMMONIA" in the last 168 hours.  ABG    Component Value Date/Time   PHART 7.358 06/04/2022 1406   PCO2ART 39.3 06/04/2022 1406   PO2ART 238 (H) 06/04/2022 1406   HCO3 22.3 06/04/2022 1406   TCO2 23 06/04/2022 1406   ACIDBASEDEF 3.0 (H) 06/04/2022 1406   O2SAT 100 06/04/2022 1406     Coagulation Profile: No results for input(s): "INR", "PROTIME" in the last 168 hours.  Cardiac Enzymes: No results for input(s): "CKTOTAL", "CKMB", "CKMBINDEX", "TROPONINI" in the last 168 hours.  HbA1C: Hgb A1c MFr Bld  Date/Time Value Ref Range Status  06/02/2022 04:41 AM 5.5 4.8 - 5.6 % Final    Comment:    (NOTE) Pre diabetes:          5.7%-6.4%  Diabetes:              >6.4%  Glycemic control for   <7.0% adults with diabetes   09/18/2020 01:23 PM 5.8 (H) 4.8 - 5.6 % Final    Comment:    (NOTE) Pre diabetes:          5.7%-6.4%  Diabetes:              >6.4%  Glycemic control for   <7.0% adults with diabetes     CBG: No results for input(s): "GLUCAP" in the last 168 hours.  Review of Systems:   Reviewed and negative  Past Medical History:  He,  has a past medical history of A-fib ([place]), Arthritis, Complication of anesthesia, DVT (deep venous thrombosis) ([place]), Dysrhythmia, GERD (gastroesophageal reflux disease), History of hiatal hernia, Hypertension, Impaired glucose tolerance, Multiple myeloma ([place]), Obesity (BMI 30-39.9) (07/26/2016), OSA on CPAP (07/26/2016), PAF (paroxysmal atrial fibrillation) ([place]), Pneumonia, Pre-diabetes, and Thrombophlebitis.   Surgical History:   Past Surgical History:  Procedure Laterality Date   ATRIAL FIBRILLATION ABLATION  04/05/2017   ATRIAL FIBRILLATION ABLATION N/A 04/05/2017   Procedure: ATRIAL FIBRILLATION ABLATION;  Surgeon: Thompson Grayer, MD;  Location: [facility];   Service: Cardiovascular;  Laterality: N/A;   BONE BIOPSY Right 12/17/2021   Procedure: BONE BIOPSY;  Surgeon: Hiram Gash, MD;  Location: [facility];  Service: Orthopedics;  Laterality: Right;   COMPLETE RIGHT HIP REPLACMENT  01/19/2019   EVALUATION UNDER ANESTHESIA WITH HEMORRHOIDECTOMY N/A 02/24/2017   Procedure: EXAM UNDER ANESTHESIA WITH  HEMORRHOIDECTOMY;  Surgeon: Michael Boston, MD;  Location: [facility];  Service: General;  Laterality: N/A;   EXCISIONAL TOTAL HIP ARTHROPLASTY WITH ANTIBIOTIC SPACERS Right 09/25/2020   Procedure: EXCISIONAL TOTAL HIP ARTHROPLASTY WITH ANTIBIOTIC SPACERS;  Surgeon: Paralee Cancel, MD;  Location: [facility];  Service: Orthopedics;  Laterality: Right;   FLEXIBLE SIGMOIDOSCOPY N/A 04/15/2015   Procedure:  UNSEDATED FLEXIBLE SIGMOIDOSCOPY;  Surgeon: Ursula Alert  Wynetta Emery, MD;  Location: [facility];  Service: Endoscopy;  Laterality: N/A;   HUMERUS IM NAIL Right 12/17/2021   Procedure: INTRAMEDULLARY (IM) NAIL HUMERAL;  Surgeon: Hiram Gash, MD;  Location: [facility];  Service: Orthopedics;  Laterality: Right;   KNEE ARTHROSCOPY Left ~ 2016   POSTERIOR CERVICAL FUSION/FORAMINOTOMY N/A 06/04/2022   Procedure: Thoracic one - Thoracic Four Posterior lateral arthrodesis/ fusion and Thoracic two Corpectomy;  Surgeon: Ashok Pall, MD;  Location: [facility];  Service: Neurosurgery;  Laterality: N/A;   REIMPLANTATION OF TOTAL HIP Right 12/25/2020   Procedure: REIMPLANTATION/REVISION OF RIGHT TOTAL HIP VERSUS REPEAT IRRIGATION AND DEBRIDEMENT;  Surgeon: Paralee Cancel, MD;  Location: [facility];  Service: Orthopedics;  Laterality: Right;   TESTICLE SURGERY  1988   VARICOCELE   TONSILLECTOMY     VARICOSE VEIN SURGERY Bilateral      Social History:   reports that he has never smoked. He has never used smokeless tobacco. He reports current alcohol use of about 2.0 standard drinks of alcohol per week. He reports that he does not use drugs.   Family History:  His  family history includes Atrial fibrillation in his father; Dementia in his maternal grandmother and mother; Hypertension in his father and paternal grandfather; Stroke in his father.   Allergies Allergies  Allergen Reactions   Linezolid Other (See Comments)    Burning tongue    Vancomycin Rash     Home Medications  Prior to Admission medications   Medication Sig Start Date End Date Taking? Authorizing Provider  acetaminophen (TYLENOL) 325 MG tablet Take 2 tablets (650 mg total) by mouth every 6 (six) hours as needed. Patient taking differently: Take 650 mg by mouth every 6 (six) hours as needed for mild pain or headache. 03/15/22  Yes Jeanell Sparrow, DO  acyclovir (ZOVIRAX) 400 MG tablet Take 1 tablet (400 mg total) by mouth 2 (two) times daily. 12/10/21  Yes Orson Slick, MD  amiodarone (PACERONE) 200 MG tablet Take 1 tablet (200 mg total) by mouth daily. 05/12/22  Yes Vickie Epley, MD  amLODipine (NORVASC) 10 MG tablet Take 10 mg by mouth daily.   Yes [provider]  apixaban (ELIQUIS) 5 MG TABS tablet Take 1 tablet (5 mg total) by mouth 2 (two) times daily. 06/29/22  Yes Nita Sells, MD  cholecalciferol (VITAMIN D3) 25 MCG (1000 UNIT) tablet Take 1,000 Units by mouth daily.   Yes [provider]  Cyanocobalamin (VITAMIN B-12 PO) Take 1,000 mcg by mouth daily.   Yes [provider]  dexamethasone (DECADRON) 4 MG tablet Take 4 mg by mouth See admin instructions. Take 4 mg by mouth once a day for 3 days after chemo on Wednesday- or unless otherwise instructed   Yes [provider]  diltiazem (CARDIZEM CD) 240 MG 24 hr capsule Take 1 capsule (240 mg total) by mouth daily. 06/23/22  Yes Nita Sells, MD  fluticasone (FLONASE) 50 MCG/ACT nasal spray Place 1 spray into both nostrils daily as needed for allergies or rhinitis.   Yes [provider]  folic acid (FOLVITE) 1 MG tablet Take 1 mg by mouth daily. 11/12/19  Yes [provider]  furosemide (LASIX) 40 MG tablet Take 40 mg by mouth daily as needed for edema.   Yes [provider]  gabapentin (NEURONTIN) 300 MG capsule TAKE TWO CAPSULES BY MOUTH TWICE A DAY Patient taking differently: Take 600 mg by mouth 2 (two) times daily. 03/29/22  Yes Patel, Donika K, DO  hydrALAZINE (APRESOLINE) 25 MG tablet Take 1.5 tablets (37.5 mg total) by mouth 3 (three) times daily. 05/12/22  Yes Vickie Epley, MD  HYDROcodone-acetaminophen (NORCO) 7.5-325 MG tablet Take 1 tablet by mouth every 6 (six) hours as needed for moderate pain. 06/18/22  Yes [provider]  lisinopril (ZESTRIL) 40 MG tablet TAKE ONE TABLET BY MOUTH ONE TIME DAILY Patient taking differently: Take 40 mg by mouth daily. 08/12/21  Yes Fay Records, MD  methocarbamol (ROBAXIN) 750 MG tablet Take 750 mg by mouth every 8 (eight) hours as needed for muscle spasms. 06/16/22  Yes [provider]  pomalidomide (POMALYST) 4 MG capsule Take 4 mg by mouth See admin instructions. Take 4 mg by mouth once a day for 21 days "on," then 7 days "off." Repeat every 28 days.   Yes [provider]  potassium chloride SA (KLOR-CON M) 20 MEQ tablet Take 2 tablets (40 mEq total) by mouth daily. 06/22/22  Yes Nita Sells, MD  sodium chloride (OCEAN) 0.65 % SOLN nasal spray Place 1 spray into both nostrils as needed for congestion.   Yes [provider]  traMADol (ULTRAM) 50 MG tablet Take 1 tablet (50 mg total) by mouth every 6 (six) hours as needed. Patient taking differently: Take 50 mg by mouth every 6 (six) hours as needed for moderate pain. 05/18/22  Yes Dede Query T, PA-C  apixaban (ELIQUIS) 5 MG TABS tablet Take 2 tablets (10 mg total) by mouth 2 (two) times daily for 7 days. Patient not taking: Reported on 06/30/2022 06/22/22 06/30/22  Nita Sells, MD  Omega-3 Fatty Acids (FISH OIL PO) Take 1 Capful by mouth daily. Patient not taking: Reported on 06/30/2022     [provider]     Signature:  Chesley Mires, MD [place] Pager - [phone] or [phone] 07/01/2022, 6:22 PM

## 2022-07-01 NOTE — Progress Notes (Signed)
[facility] Progress Note Patient Name: Travis [name] DOB: [birth year] MRN: [id number]   Date of Service  07/01/2022  HPI/Events of Note  Patient with metastatic multiple myeloma admitted with bilateral lung infiltrates (pneumonia vs ILD), atrial fibrillation with RVR, acute hypoxemic respiratory failure. He was also recently diagnosed with DVT.  eICU Interventions  New Patient Evaluation.        Frederik Pear 07/01/2022, 8:33 PM

## 2022-07-01 NOTE — Progress Notes (Addendum)
PROGRESS NOTE    Travis [name]  [name] DOB: 1950-11-25 DOA: 06/29/2022 PCP: [name], MD     Brief Narrative:   OSA on CPAP, multiple myeloma currently on oral chemotherapy and radiation therapy, who has known atrial fibrillation on Eliquis, his anticoagulation was recently held at the beginning of February due to pathologic thoracic spine fracture, requiring posterior lateral arthrodesis/fusion and corpectomy for tumor resection with Dr. Christella Noa on 2/2. Recently discharged on 2/20 with new DVT left peroneal vein who presents with shortness of breath and hypoxia.    Subjective:  On 5liter o2 supplement at rest, RN reports o2 drops to low 80's with minimal activity  He reports intermittent Dry cough, denies chest pain, Trace edema bilateral lower extremity  no fever    Assessment & Plan:  Principal Problem:   Acute hypoxic respiratory failure (HCC) Active Problems:   Hypertension   Atrial fibrillation with RVR (HCC)   OSA (obstructive sleep apnea)   Smoldering multiple myeloma   History of DVT (deep vein thrombosis)   CAP (community acquired pneumonia)   Hypokalemia   Hyponatremia   History of thoracic surgery    Assessment and Plan:   * Acute hypoxic respiratory failure ([place]), not on home O2 at baseline --CTA chest negative for PE with multifocal bilateral airspace disease consistent with bilateral pneumonia. Small bilateral pleural effusions. -clinically not fever, no Leukocytosis, does not appear septic, does has volume overload on examination, will continue IV Lasix as long as BP and renal function allows, as cannot rule out acute on chronic diastolic CHF, suspect uncontrolled A-fib RVR could contribute to acute heart failure -on empiric IV Rocephin and Azithromcyin since admission ( changed zithro to doxcy due to being started on amiodarone to avoid Qtc issues) , check urine strep pneumo antigen and procalcitonin level less than  0.1 -pneumonitis from chemo/XRT could also be on differential, will discuss with oncology regarding steroid treatment for possible pneumonitis -viral pneumonia? Per oncology note he was treated with shingle like skin rash recently -may need bronch? Will consult pulmonology  -Daily weight, follow intake and output -wean O2 as tolerated with goal of >92%   Atrial fibrillation with RVR ([place]) - Likely from ongoing pneumonia -continue eliquis -rate not well controlled on max dose of cardizem drip, cardiology consulted, started on amiodarone drip -will follow cardiology recommendation      Hypervolemic hyponatremia Sodium 133 on presentation, sodium normalized today  Hypokalemia/hypomagnesemia Replace abd  recheck in the morning  Hypertension -continue home antihypertensives , adjust prn   Recent history of pathologic thoracic spine fracture s/p posterior lateral arthrodesis/fusion and corpectomy on 06/04/22 with neurosurgery  -recommend ambulation and OOB daily   History of DVT (deep vein thrombosis) DX 06/21/22 following discontinuation of anticoagulation for recent spinal surgery -will complete 7 days of higher dose Eliquis tonight. 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 Lorenso Courier -acyclovir '400mg'$  PO BID for VCZ prophylaxis   Per oncology note, patient had rash concerning for shingles, required valcyclovir x 10days from 2/21, otherwise on acyclorvi 400bid for VCZ prophylaxis   OSA (obstructive sleep apnea) -continue nightly CPAP       I have Reviewed  nursing notes, Vitals, pain scores, I/o's, Lab results and  imaging results since pt's  last encounter, details please see discussion above  I ordered the following labs:  Unresulted Labs (From admission, onward)     Start     Ordered   07/01/22 [id number]  Basic metabolic panel  Daily,   R      06/30/22 1215   07/01/22 0500  Procalcitonin  Daily,   R     References:    Procalcitonin Lower Respiratory Tract Infection AND Sepsis Procalcitonin Algorithm   06/30/22 1235   06/30/22 1223  Culture, blood (Routine X 2) w Reflex to ID Panel  BLOOD CULTURE X 2,   R (with TIMED occurrences)      06/30/22 1222   06/30/22 1205  Strep pneumoniae urinary antigen  Once,   R        06/30/22 1204             DVT prophylaxis:  apixaban (ELIQUIS) tablet 5 mg   Code Status:   Code Status: Full Code  Family Communication: wife over the phone Disposition:    Dispo: The patient is from: Home              Anticipated d/c is to: Home              Anticipated d/c date is: >48hrs  Antimicrobials:   Anti-infectives (From admission, onward)    Start     Dose/Rate Route Frequency Ordered Stop   07/02/22 1000  doxycycline (VIBRA-TABS) tablet 100 mg        100 mg Oral Every 12 hours 07/01/22 0951 07/05/22 2159   07/01/22 1000  azithromycin (ZITHROMAX) tablet 500 mg  Status:  Discontinued        500 mg Oral Daily 06/30/22 1234 07/01/22 0951   06/30/22 1800  cefTRIAXone (ROCEPHIN) 1 g in sodium chloride 0.9 % 100 mL IVPB        1 g 200 mL/hr over 30 Minutes Intravenous Every 24 hours 06/29/22 1933     06/30/22 1000  acyclovir (ZOVIRAX) tablet 400 mg        400 mg Oral 2 times daily 06/30/22 0024     06/30/22 0600  azithromycin (ZITHROMAX) 500 mg in sodium chloride 0.9 % 250 mL IVPB  Status:  Discontinued        500 mg 250 mL/hr over 60 Minutes Intravenous Every 24 hours 06/29/22 1933 06/30/22 1234   06/29/22 1830  cefTRIAXone (ROCEPHIN) 1 g in sodium chloride 0.9 % 100 mL IVPB        1 g 200 mL/hr over 30 Minutes Intravenous  Once 06/29/22 1822 06/29/22 1910   06/29/22 1830  doxycycline  (VIBRA-TABS) tablet 100 mg        100 mg Oral  Once 06/29/22 1822 06/29/22 1840           Objective: Vitals:   07/01/22 0500 07/01/22 1000 07/01/22 1031 07/01/22 1035  BP:  133/77  (!) 138/90  Pulse:  (!) 123  (!) 115  Resp:  (!) 28  (!) 26  Temp:    98.3 F (36.8 C)  TempSrc:      SpO2:  94% 94%   Weight: 108 kg       Intake/Output Summary (Last 24 hours) at 07/01/2022 1130 Last data filed at 07/01/2022 1117 Gross per 24 hour  Intake 1255.67 ml  Output 2225 ml  Net -969.33 ml   Filed Weights   07/01/22 0500  Weight: 108 kg    Examination:  General exam: AAOx3,  chronically ill-appearing Respiratory system: .  Diminished,, mild rales at bases, no wheezing, no rhonchi, respiratory effort normal. Cardiovascular system:  IRRR.  Gastrointestinal system: Abdomen is nondistended, soft and nontender.  Normal bowel sounds heard. Central nervous system: Alert and oriented. No focal neurological deficits. Extremities: Trace bilateral lower extremity pitting edema Skin: No rashes, lesions or ulcers Psychiatry: Judgement and insight appear normal. Mood & affect appropriate.     Data Reviewed: I have personally reviewed  labs and visualized  imaging studies since the last encounter and formulate the plan        Scheduled Meds:  acyclovir  400 mg Oral BID   apixaban  5 mg Oral BID   [START ON 07/02/2022] doxycycline  100 mg Oral Q12H   guaiFENesin  600 mg Oral BID   metoprolol tartrate  25 mg Oral BID   Continuous Infusions:  amiodarone 60 mg/hr (07/01/22 1117)   Followed by   amiodarone     cefTRIAXone (ROCEPHIN)  IV Stopped (06/30/22 1928)   diltiazem (CARDIZEM) infusion 15 mg/hr (07/01/22 1117)     LOS: 2 days   Time spent:  25mns  FFlorencia Reasons MD PhD FACP [facility]  Available via Epic secure chat 7am-7pm for nonurgent issues Please page for urgent issues To page the attending provider between 7A-7P or the covering provider during after hours  7P-7A, please log into the web site [email or web address] and access using universal [place] password for that web site. If you do not have the password, please call the hospital operator.    07/01/2022, 11:30 AM

## 2022-07-02 ENCOUNTER — Ambulatory Visit: Payer: Medicare Other

## 2022-07-02 ENCOUNTER — Ambulatory Visit: Payer: Medicare Other | Admitting: Nurse Practitioner

## 2022-07-02 DIAGNOSIS — J9601 Acute respiratory failure with hypoxia: Secondary | ICD-10-CM | POA: Diagnosis not present

## 2022-07-02 LAB — BASIC METABOLIC PANEL
Anion gap: 8 (ref 5–15)
BUN: 20 mg/dL (ref 8–23)
CO2: 21 mmol/L — ABNORMAL LOW (ref 22–32)
Calcium: 7.9 mg/dL — ABNORMAL LOW (ref 8.9–10.3)
Chloride: 104 mmol/L (ref 98–111)
Creatinine, Ser: 0.76 mg/dL (ref 0.61–1.24)
GFR, Estimated: >60 mL/min (ref >60)
Glucose, Bld: 197 mg/dL — ABNORMAL HIGH (ref 70–99)
Potassium: 3.7 mmol/L (ref 3.5–5.1)
Sodium: 133 mmol/L — ABNORMAL LOW (ref 135–145)

## 2022-07-02 LAB — RESPIRATORY PANEL BY PCR

## 2022-07-02 LAB — PROCALCITONIN: Procalcitonin: <0.1 ng/mL

## 2022-07-02 MED ORDER — METOPROLOL TARTRATE 25 MG PO TABS
25.0000 mg | ORAL_TABLET | Freq: Four times a day (QID) | ORAL | Status: DC
  Administered 2022-07-02 – 2022-07-04 (×8): 25 mg via ORAL
  Filled 2022-07-02 (×8): qty 1

## 2022-07-02 MED ORDER — METHYLPREDNISOLONE SODIUM SUCC 125 MG IJ SOLR
125.0000 mg | Freq: Every day | INTRAMUSCULAR | Status: AC
  Administered 2022-07-03: 125 mg via INTRAVENOUS
  Filled 2022-07-02: qty 2

## 2022-07-02 MED ORDER — POTASSIUM CHLORIDE CRYS ER 20 MEQ PO TBCR
40.0000 meq | EXTENDED_RELEASE_TABLET | Freq: Once | ORAL | Status: AC
  Administered 2022-07-02: 40 meq via ORAL
  Filled 2022-07-02: qty 2

## 2022-07-02 MED ORDER — FUROSEMIDE 10 MG/ML IJ SOLN
40.0000 mg | Freq: Four times a day (QID) | INTRAMUSCULAR | Status: AC
  Administered 2022-07-02 (×2): 40 mg via INTRAVENOUS
  Filled 2022-07-02 (×2): qty 4

## 2022-07-02 NOTE — Progress Notes (Signed)
PROGRESS NOTE    Travis [name]  [name] DOB: 1950/07/11 DOA: 06/29/2022 PCP: [name], MD     Brief Narrative:   OSA on CPAP, multiple myeloma currently on oral chemotherapy and radiation therapy, who has known atrial fibrillation on Eliquis, his anticoagulation was recently held at the beginning of February due to pathologic thoracic spine fracture, requiring posterior lateral arthrodesis/fusion and corpectomy for tumor resection with Dr. Christella Noa on 2/2. Recently discharged on 2/20 with new DVT left peroneal vein who presents with shortness of breath and hypoxia.    Subjective:  On 10liter HFNC at rest, on amiodarone drip, remain in afib, heart rate controlled ,  Reports feeling better, denies pain No fever  He reports intermittent Dry cough, denies chest pain, Trace edema bilateral lower extremity      Assessment & Plan:  Principal Problem:   Acute hypoxic respiratory failure (HCC) Active Problems:   Hypertension   Atrial fibrillation with RVR (HCC)   OSA (obstructive sleep apnea)   Smoldering multiple myeloma   History of DVT (deep vein thrombosis)   CAP (community acquired pneumonia)   Hypokalemia   Hyponatremia   History of thoracic surgery   Pulmonary infiltrates   Acute respiratory failure with hypoxemia (HCC)    Assessment and Plan:   * Acute hypoxic respiratory failure ([place]), not on home O2 at baseline --CTA chest negative for PE with multifocal bilateral airspace disease consistent with bilateral pneumonia. Small bilateral pleural effusions. -respiratory viral panel +Coronavirus OC43, viral pneumonia? -clinically not fever, no Leukocytosis, does not appear septic, does has volume overload on examination, will continue IV Lasix as long as BP and renal function allows, as cannot rule out acute on chronic diastolic CHF, suspect uncontrolled A-fib RVR could contribute to acute heart failure -on empiric IV Rocephin and Azithromcyin since  admission ( changed zithro to doxcy due to being started on amiodarone to avoid Qtc issues) , urine strep pneumo antigen negative  and procalcitonin level less than 0.1 -pneumonitis from chemo/XRT could also be on differential, pccm started steroids for  possible pneumonitis -may need bronch? Follow pulmonology recommendation -Daily weight, follow intake and output -keep in stepdown, wean O2 as tolerated with goal of >92%   Atrial fibrillation with RVR ([place]) - Likely from ongoing pneumonia -continue eliquis -rate not well controlled on max dose of cardizem drip, cardiology consulted, started on amiodarone drip -will follow cardiology recommendation      Hypervolemic hyponatremia Sodium 133 on presentation, sodium normalized , now start to fluctuate,   Hypokalemia/hypomagnesemia Supplement prn to keep K>4, mag >2, recheck in am  Hypertension -continue home antihypertensives , adjust prn   Recent history of pathologic thoracic spine fracture s/p posterior lateral arthrodesis/fusion and corpectomy on 06/04/22 with neurosurgery  -recommend ambulation and OOB daily   History of DVT (deep vein thrombosis) DX 06/21/22 following discontinuation of anticoagulation for recent spinal surgery -will complete 7 days of higher dose Eliquis tonight. 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 Lorenso Courier -acyclovir '400mg'$  PO BID for VCZ prophylaxis   Per oncology note, patient had rash concerning for shingles, required valcyclovir x 10days from 2/21, otherwise on acyclorvi 400bid for VCZ  prophylaxis   OSA (obstructive sleep apnea) -continue nightly  CPAP       I have Reviewed nursing notes, Vitals, pain scores, I/o's, Lab results and  imaging results since pt's last encounter, details please see discussion above  I ordered the following labs:  Unresulted Labs (From admission, onward)     Start     Ordered   07/03/22 0500  CBC  Tomorrow morning,   R       Question:  Specimen collection method  Answer:  Lab=Lab collect   07/02/22 0922   07/03/22 0500  Magnesium  Tomorrow morning,   R       Question:  Specimen collection method  Answer:  Lab=Lab collect   07/02/22 0922   07/03/22 0500  Phosphorus  Tomorrow morning,   R       Question:  Specimen collection method  Answer:  Lab=Lab collect   07/02/22 0922   07/01/22 [id number]  Basic metabolic panel  Daily,   R      06/30/22 1215             DVT prophylaxis:  apixaban (ELIQUIS) tablet 5 mg   Code Status:   Code Status: Full Code  Family Communication: wife over the phone on 2/29 Disposition:    Dispo: The patient is from: Home              Anticipated d/c is to: Home              Anticipated d/c date is: >48hrs  Antimicrobials:   Anti-infectives (From admission, onward)    Start     Dose/Rate Route Frequency Ordered Stop   07/02/22 1000  doxycycline (VIBRA-TABS) tablet 100 mg        100 mg Oral Every 12 hours 07/01/22 0951 07/05/22 2159   07/01/22 1000  azithromycin (ZITHROMAX) tablet 500 mg  Status:  Discontinued        500 mg Oral Daily 06/30/22 1234 07/01/22 0951   06/30/22 1800  cefTRIAXone (ROCEPHIN) 1 g in sodium chloride 0.9 % 100 mL IVPB        1 g 200 mL/hr over 30 Minutes Intravenous Every 24 hours 06/29/22 1933     06/30/22 1000  acyclovir (ZOVIRAX) tablet 400 mg        400 mg Oral 2 times daily 06/30/22 0024     06/30/22 0600  azithromycin (ZITHROMAX) 500 mg in sodium chloride 0.9 % 250 mL IVPB  Status:  Discontinued        500 mg 250 mL/hr over 60 Minutes Intravenous Every 24 hours 06/29/22 1933 06/30/22 1234   06/29/22 1830  cefTRIAXone (ROCEPHIN) 1  g in sodium chloride 0.9 % 100 mL IVPB        1 g 200 mL/hr over 30 Minutes Intravenous  Once 06/29/22 1822 06/29/22 1910   06/29/22 1830  doxycycline (VIBRA-TABS) tablet 100 mg        100 mg Oral  Once 06/29/22 1822 06/29/22 1840           Objective: Vitals:   07/02/22 0815 07/02/22 0830 07/02/22 0845 07/02/22 0853  BP:      Pulse: 85 71 86   Resp: (!) 27 (!) 22 (!) 27   Temp:    97.6 F (36.4 C)  TempSrc:    Oral  SpO2: 94% 98% 94%   Weight:      Height:        Intake/Output Summary (Last 24 hours) at  07/02/2022 1107 Last data filed at 07/02/2022 0800 Gross per 24 hour  Intake 1731.94 ml  Output 1000 ml  Net 731.94 ml   Filed Weights   07/01/22 0500 07/01/22 1910 07/02/22 0705  Weight: 108 kg 108 kg 108.8 kg    Examination:  General exam: AAOx3,  chronically ill-appearing Respiratory system: .  Diminished,, mild rales at bases, no wheezing, no rhonchi, respiratory effort normal. Cardiovascular system:  IRRR.  Gastrointestinal system: Abdomen is nondistended, soft and nontender.  Normal bowel sounds heard. Central nervous system: Alert and oriented. No focal neurological deficits. Extremities: Trace bilateral lower extremity pitting edema Skin: No rashes, lesions or ulcers Psychiatry: Judgement and insight appear normal. Mood & affect appropriate.     Data Reviewed: I have personally reviewed  labs and visualized  imaging studies since the last encounter and formulate the plan        Scheduled Meds:  acyclovir  400 mg Oral BID   apixaban  5 mg Oral BID   Chlorhexidine Gluconate Cloth  6 each Topical Daily   doxycycline  100 mg Oral Q12H   furosemide  40 mg Intravenous Q6H   guaiFENesin  600 mg Oral BID   [START ON 07/03/2022] methylPREDNISolone (SOLU-MEDROL) injection  125 mg Intravenous Daily   metoprolol tartrate  25 mg Oral QID   potassium chloride  40 mEq Oral Once   Continuous Infusions:  amiodarone 30 mg/hr (07/02/22 0815)   cefTRIAXone  (ROCEPHIN)  IV 200 mL/hr at 07/02/22 0800     LOS: 3 days   Time spent:  28mns  FFlorencia Reasons MD PhD FACP [facility]  Available via Epic secure chat 7am-7pm for nonurgent issues Please page for urgent issues To page the attending provider between 7A-7P or the covering provider during after hours 7P-7A, please log into the web site [email or web address] and access using universal [place] password for that web site. If you do not have the password, please call the hospital operator.    07/02/2022, 11:07 AM

## 2022-07-02 NOTE — Progress Notes (Signed)
NAME:  Travis [name], MRN:  [id number], DOB:  October 28, 1950, LOS: 3 ADMISSION DATE:  06/29/2022, CONSULTATION DATE:  07/01/2022 REFERRING MD:  Dr. Erlinda Hong, [facility], CHIEF COMPLAINT:  Short of breath   History of Present Illness:  72 yo male with metastatic multiple myeloma on chemotherapy presented to [facility] ER with dry cough, dyspnea, and leg swelling.  SpO2 83% on room air.  Found to have pulmonary infiltrates concerning for pneumonia.  Started on supplemental oxygen, antibiotics, and lasix.    Pertinent  Medical History  A fib, Arthritis, DVT, GERD, Hiatal hernia, HTN, Multiple myeloma, OSA, Peripheral neuropathy, Cervical spinal stenosis  Significant Hospital Events: Including procedures, antibiotic start and stop dates in addition to other pertinent events   2/27 Admit, start ABx 2/29 cardiology consulted 3/1 coronavirus non covid positive  Studies:  CT angio chest 06/29/22 >> widespread b/l ASD most in RUL, small bilateral R>L effusions, lytic lesions in Rt 6th rib/T11 spinous process/Lt L1 pedicle  Interim History / Subjective:  CT with volume overload and GGOs - coronavirus OC43 positive  Objective   Blood pressure (!) 151/98, pulse 86, temperature 97.6 F (36.4 C), temperature source Oral, resp. rate (!) 27, height '5\' 10"'$  (1.778 m), weight 108.8 kg, SpO2 94 %.        Intake/Output Summary (Last 24 hours) at 07/02/2022 1004 Last data filed at 07/02/2022 0800 Gross per 24 hour  Intake 1731.94 ml  Output 1000 ml  Net 731.94 ml    Filed Weights   07/01/22 0500 07/01/22 1910 07/02/22 0705  Weight: 108 kg 108 kg 108.8 kg    Examination:  General - alert Eyes - EOMI, no icterus ENT - no sinus tenderness, no stridor Cardiac - irregular Chest - NWOB, on salter, faint bilateral crackles. Abdomen - soft, non tender, + bowel sounds Extremities - no cyanosis, clubbing, or edema Skin - no rashes Neuro - normal strength, moves extremities, follows commands Psych - normal mood and  behavior   Resolved Hospital Problem list     Assessment & Plan:   Acute hypoxic respiratory failure with bilateral pulmonary infiltrates most likely due to Coronavirus OC43 pneumonia -  negative procalcitonin - less likely pulmonary toxicity from amio or velcade - Continue steroids, solumedrol 1 mg/kg for 3 days then oral taper - ok to discontinue antibiotics  Volume overload: Bilateral pleural effusions on prior CT this admission --lasix IV BID 3/1   Best Practice (right click and "Reselect all SmartList Selections" daily)   Diet/type: Regular consistency (see orders) DVT prophylaxis: DOAC GI prophylaxis: N/A Lines: N/A Foley:  N/A Code Status:  full code Last date of multidisciplinary goals of care discussion [n/a]  Labs   CBC: Recent Labs  Lab 06/29/22 1512 06/30/22 0402  WBC 9.0 7.6  HGB 9.7* 10.0*  HCT 29.8* 29.7*  MCV 98.0 97.7  PLT 231 211     Basic Metabolic Panel: Recent Labs  Lab 06/29/22 1512 06/30/22 0402 07/01/22 0348 07/02/22 0346  NA 133* 136 135 133*  K 3.2* 3.1* 3.6 3.7  CL 102 104 104 104  CO2 '24 25 24 '$ 21*  GLUCOSE 174* 117* 138* 197*  BUN '17 16 17 20  '$ CREATININE 0.79 0.79 0.72 0.76  CALCIUM 8.0* 7.4* 7.7* 7.9*  MG  --   --  1.7  --   PHOS  --   --  2.7  --     GFR: Estimated Creatinine Clearance: 104.6 mL/min (by C-G formula based on SCr of 0.76 mg/dL).  Recent Labs  Lab 06/29/22 1512 06/30/22 0402 07/01/22 0348 07/02/22 0346  PROCALCITON  --   --  <0.10 <0.10  WBC 9.0 7.6  --   --      Liver Function Tests: Recent Labs  Lab 06/29/22 1512  AST 19  ALT 24  ALKPHOS 78  BILITOT 0.7  PROT 5.2*  ALBUMIN 2.4*    No results for input(s): "LIPASE", "AMYLASE" in the last 168 hours. No results for input(s): "AMMONIA" in the last 168 hours.  ABG    Component Value Date/Time   PHART 7.358 06/04/2022 1406   PCO2ART 39.3 06/04/2022 1406   PO2ART 238 (H) 06/04/2022 1406   HCO3 22.3 06/04/2022 1406   TCO2 23 06/04/2022  1406   ACIDBASEDEF 3.0 (H) 06/04/2022 1406   O2SAT 100 06/04/2022 1406     Coagulation Profile: No results for input(s): "INR", "PROTIME" in the last 168 hours.  Cardiac Enzymes: No results for input(s): "CKTOTAL", "CKMB", "CKMBINDEX", "TROPONINI" in the last 168 hours.  HbA1C: Hgb A1c MFr Bld  Date/Time Value Ref Range Status  06/02/2022 04:41 AM 5.5 4.8 - 5.6 % Final    Comment:    (NOTE) Pre diabetes:          5.7%-6.4%  Diabetes:              >6.4%  Glycemic control for   <7.0% adults with diabetes   09/18/2020 01:23 PM 5.8 (H) 4.8 - 5.6 % Final    Comment:    (NOTE) Pre diabetes:          5.7%-6.4%  Diabetes:              >6.4%  Glycemic control for   <7.0% adults with diabetes     CBG: No results for input(s): "GLUCAP" in the last 168 hours.  Review of Systems:   Reviewed and negative  Past Medical History:  He,  has a past medical history of A-fib ([place]), Arthritis, Complication of anesthesia, DVT (deep venous thrombosis) ([place]), Dysrhythmia, GERD (gastroesophageal reflux disease), History of hiatal hernia, Hypertension, Impaired glucose tolerance, Multiple myeloma ([place]), Obesity (BMI 30-39.9) (07/26/2016), OSA on CPAP (07/26/2016), PAF (paroxysmal atrial fibrillation) ([place]), Pneumonia, Pre-diabetes, and Thrombophlebitis.   Surgical History:   Past Surgical History:  Procedure Laterality Date   ATRIAL FIBRILLATION ABLATION  04/05/2017   ATRIAL FIBRILLATION ABLATION N/A 04/05/2017   Procedure: ATRIAL FIBRILLATION ABLATION;  Surgeon: Thompson Grayer, MD;  Location: [facility];  Service: Cardiovascular;  Laterality: N/A;   BONE BIOPSY Right 12/17/2021   Procedure: BONE BIOPSY;  Surgeon: Hiram Gash, MD;  Location: [facility];  Service: Orthopedics;  Laterality: Right;   COMPLETE RIGHT HIP REPLACMENT  01/19/2019   EVALUATION UNDER ANESTHESIA WITH HEMORRHOIDECTOMY N/A 02/24/2017   Procedure: EXAM UNDER ANESTHESIA WITH  HEMORRHOIDECTOMY;   Surgeon: Michael Boston, MD;  Location: [facility];  Service: General;  Laterality: N/A;   EXCISIONAL TOTAL HIP ARTHROPLASTY WITH ANTIBIOTIC SPACERS Right 09/25/2020   Procedure: EXCISIONAL TOTAL HIP ARTHROPLASTY WITH ANTIBIOTIC SPACERS;  Surgeon: Paralee Cancel, MD;  Location: [facility];  Service: Orthopedics;  Laterality: Right;   FLEXIBLE SIGMOIDOSCOPY N/A 04/15/2015   Procedure:  UNSEDATED FLEXIBLE SIGMOIDOSCOPY;  Surgeon: [name], MD;  Location: [facility];  Service: Endoscopy;  Laterality: N/A;   HUMERUS IM NAIL Right 12/17/2021   Procedure: INTRAMEDULLARY (IM) NAIL HUMERAL;  Surgeon: Hiram Gash, MD;  Location: [place];  Service: Orthopedics;  Laterality: Right;  KNEE ARTHROSCOPY Left ~ 2016   POSTERIOR CERVICAL FUSION/FORAMINOTOMY N/A 06/04/2022   Procedure: Thoracic one - Thoracic Four Posterior lateral arthrodesis/ fusion and Thoracic two Corpectomy;  Surgeon: Ashok Pall, MD;  Location: [facility];  Service: Neurosurgery;  Laterality: N/A;   REIMPLANTATION OF TOTAL HIP Right 12/25/2020   Procedure: REIMPLANTATION/REVISION OF RIGHT TOTAL HIP VERSUS REPEAT IRRIGATION AND DEBRIDEMENT;  Surgeon: Paralee Cancel, MD;  Location: [facility];  Service: Orthopedics;  Laterality: Right;   TESTICLE SURGERY  1988   VARICOCELE   TONSILLECTOMY     VARICOSE VEIN SURGERY Bilateral      Social History:   reports that he has never smoked. He has never used smokeless tobacco. He reports current alcohol use of about 2.0 standard drinks of alcohol per week. He reports that he does not use drugs.   Family History:  His family history includes Atrial fibrillation in his father; Dementia in his maternal grandmother and mother; Hypertension in his father and paternal grandfather; Stroke in his father.   Allergies Allergies  Allergen Reactions   Linezolid Other (See Comments)    Burning tongue    Vancomycin Rash     Home Medications  Prior to Admission medications   Medication Sig Start  Date End Date Taking? Authorizing Provider  acetaminophen (TYLENOL) 325 MG tablet Take 2 tablets (650 mg total) by mouth every 6 (six) hours as needed. Patient taking differently: Take 650 mg by mouth every 6 (six) hours as needed for mild pain or headache. 03/15/22  Yes Jeanell Sparrow, DO  acyclovir (ZOVIRAX) 400 MG tablet Take 1 tablet (400 mg total) by mouth 2 (two) times daily. 12/10/21  Yes Orson Slick, MD  amiodarone (PACERONE) 200 MG tablet Take 1 tablet (200 mg total) by mouth daily. 05/12/22  Yes Vickie Epley, MD  amLODipine (NORVASC) 10 MG tablet Take 10 mg by mouth daily.   Yes [provider]  apixaban (ELIQUIS) 5 MG TABS tablet Take 1 tablet (5 mg total) by mouth 2 (two) times daily. 06/29/22  Yes Nita Sells, MD  cholecalciferol (VITAMIN D3) 25 MCG (1000 UNIT) tablet Take 1,000 Units by mouth daily.   Yes [provider]  Cyanocobalamin (VITAMIN B-12 PO) Take 1,000 mcg by mouth daily.   Yes [provider]  dexamethasone (DECADRON) 4 MG tablet Take 4 mg by mouth See admin instructions. Take 4 mg by mouth once a day for 3 days after chemo on Wednesday- or unless otherwise instructed   Yes [provider]  diltiazem (CARDIZEM CD) 240 MG 24 hr capsule Take 1 capsule (240 mg total) by mouth daily. 06/23/22  Yes Nita Sells, MD  fluticasone (FLONASE) 50 MCG/ACT nasal spray Place 1 spray into both nostrils daily as needed for allergies or rhinitis.   Yes [provider]  folic acid (FOLVITE) 1 MG tablet Take 1 mg by mouth daily. 11/12/19  Yes [provider]  furosemide (LASIX) 40 MG tablet Take 40 mg by mouth daily as needed for edema.   Yes [provider]  gabapentin (NEURONTIN) 300 MG capsule TAKE TWO CAPSULES BY MOUTH TWICE A DAY Patient taking differently: Take 600 mg by mouth 2 (two) times daily. 03/29/22  Yes Patel, Donika K, DO  hydrALAZINE (APRESOLINE) 25 MG tablet Take 1.5 tablets (37.5 mg  total) by mouth 3 (three) times daily. 05/12/22  Yes Vickie Epley, MD  HYDROcodone-acetaminophen (NORCO) 7.5-325 MG tablet Take 1 tablet by mouth every 6 (six) hours as  needed for moderate pain. 06/18/22  Yes [provider]  lisinopril (ZESTRIL) 40 MG tablet TAKE ONE TABLET BY MOUTH ONE TIME DAILY Patient taking differently: Take 40 mg by mouth daily. 08/12/21  Yes Fay Records, MD  methocarbamol (ROBAXIN) 750 MG tablet Take 750 mg by mouth every 8 (eight) hours as needed for muscle spasms. 06/16/22  Yes [provider]  pomalidomide (POMALYST) 4 MG capsule Take 4 mg by mouth See admin instructions. Take 4 mg by mouth once a day for 21 days "on," then 7 days "off." Repeat every 28 days.   Yes [provider]  potassium chloride SA (KLOR-CON M) 20 MEQ tablet Take 2 tablets (40 mEq total) by mouth daily. 06/22/22  Yes Nita Sells, MD  sodium chloride (OCEAN) 0.65 % SOLN nasal spray Place 1 spray into both nostrils as needed for congestion.   Yes [provider]  traMADol (ULTRAM) 50 MG tablet Take 1 tablet (50 mg total) by mouth every 6 (six) hours as needed. Patient taking differently: Take 50 mg by mouth every 6 (six) hours as needed for moderate pain. 05/18/22  Yes Dede Query T, PA-C  apixaban (ELIQUIS) 5 MG TABS tablet Take 2 tablets (10 mg total) by mouth 2 (two) times daily for 7 days. Patient not taking: Reported on 06/30/2022 06/22/22 06/30/22  Nita Sells, MD  Omega-3 Fatty Acids (FISH OIL PO) Take 1 Capful by mouth daily. Patient not taking: Reported on 06/30/2022    [provider]     Signature:  Lanier Clam, MD [place] for contact info If no response, contact [phone] pager until 7 pm, between 7 pm and 7 am please contact E-link 07/02/2022, 10:04 AM

## 2022-07-02 NOTE — Progress Notes (Signed)
Rounding Note    Patient Name: Travis [name] Date of Encounter: 07/02/2022  [place] Cardiologist: [name], MD   Subjective   Pt's breathing is better than yesterday   No CP    Inpatient Medications    Scheduled Meds:  acyclovir  400 mg Oral BID   apixaban  5 mg Oral BID   Chlorhexidine Gluconate Cloth  6 each Topical Daily   doxycycline  100 mg Oral Q12H   furosemide  40 mg Intravenous Q6H   guaiFENesin  600 mg Oral BID   methylPREDNISolone (SOLU-MEDROL) injection  125 mg Intravenous Q12H   metoprolol tartrate  25 mg Oral BID   Continuous Infusions:  amiodarone 30 mg/hr (07/02/22 0815)   cefTRIAXone (ROCEPHIN)  IV 200 mL/hr at 07/02/22 0800   diltiazem (CARDIZEM) infusion Stopped (07/02/22 0756)   PRN Meds: acetaminophen, docusate sodium, fluticasone, mouth rinse, sodium chloride   Vital Signs    Vitals:   07/02/22 0815 07/02/22 0830 07/02/22 0845 07/02/22 0853  BP:      Pulse: 85 71 86   Resp: (!) 27 (!) 22 (!) 27   Temp:    97.6 F (36.4 C)  TempSrc:    Oral  SpO2: 94% 98% 94%   Weight:      Height:        Intake/Output Summary (Last 24 hours) at 07/02/2022 0940 Last data filed at 07/02/2022 0800 Gross per 24 hour  Intake 1731.94 ml  Output 1000 ml  Net 731.94 ml      07/02/2022    7:05 AM 07/01/2022    7:10 PM 07/01/2022    5:00 AM  Last 3 Weights  Weight (lbs) 239 lb 13.8 oz 238 lb 1.6 oz 238 lb 1.6 oz  Weight (kg) 108.8 kg 108 kg 108 kg      Telemetry    AFib   80s to 90s  - Personally Reviewed  ECG    No new  - Personally Reviewed  Physical Exam   GEN: No acute distress.   Neck: Neck is full  Cardiac: RRR, no murmurs, Respiratory: Rel clear  GI: Soft, nontender,  MS: Tr RLE  edema Neuro:  Nonfocal  Psych: Normal affect   Labs    High Sensitivity Troponin:  No results for input(s): "TROPONINIHS" in the last 720 hours.   Chemistry Recent Labs  Lab 06/29/22 1512 06/30/22 0402 07/01/22 0348 07/02/22 0346  NA  133* 136 135 133*  K 3.2* 3.1* 3.6 3.7  CL 102 104 104 104  CO2 '24 25 24 '$ 21*  GLUCOSE 174* 117* 138* 197*  BUN '17 16 17 20  '$ CREATININE 0.79 0.79 0.72 0.76  CALCIUM 8.0* 7.4* 7.7* 7.9*  MG  --   --  1.7  --   PROT 5.2*  --   --   --   ALBUMIN 2.4*  --   --   --   AST 19  --   --   --   ALT 24  --   --   --   ALKPHOS 78  --   --   --   BILITOT 0.7  --   --   --   GFRNONAA >60 >60 >60 >60  ANIONGAP '7 7 7 8    '$ Lipids No results for input(s): "CHOL", "TRIG", "HDL", "LABVLDL", "LDLCALC", "CHOLHDL" in the last 168 hours.  Hematology Recent Labs  Lab 06/29/22 1512 06/30/22 0402  WBC 9.0 7.6  RBC 3.04* 3.04*  HGB 9.7* 10.0*  HCT 29.8* 29.7*  MCV 98.0 97.7  MCH 31.9 32.9  MCHC 32.6 33.7  RDW 16.3* 16.4*  PLT 231 211   Thyroid No results for input(s): "TSH", "FREET4" in the last 168 hours.  BNP Recent Labs  Lab 06/29/22 1512  BNP 785.8*    DDimer No results for input(s): "DDIMER" in the last 168 hours.   Radiology    DG CHEST PORT 1 VIEW  Result Date: 07/01/2022 CLINICAL DATA:  Hypoxia EXAM: PORTABLE CHEST 1 VIEW COMPARISON:  Portable exam 1054 hours compared to 06/29/2022 FINDINGS: Normal heart size mediastinal contours. Extensive BILATERAL patchy airspace infiltrates consistent with multifocal pneumonia, little changed. No pleural effusion or pneumothorax. Bones demineralized with evidence of prior cervicothoracic fusion and proximal RIGHT humeral nailing. IMPRESSION: Patchy BILATERAL pulmonary infiltrates consistent with multifocal pneumonia, little changed. Electronically Signed   By: Lavonia Dana M.D.   On: 07/01/2022 11:57    Cardiac Studies  Echo   06/22/22  1. Appears to be in sinus rhythm at the time of evaluation- A waves  present on diastology. Left ventricular ejection fraction, by estimation,  is 60 to 65%. The left ventricle has normal function. The left ventricle  has no regional wall motion  abnormalities. There is mild concentric left ventricular  hypertrophy. Left  ventricular diastolic parameters are consistent with Grade II diastolic  dysfunction (pseudonormalization).   2. Right ventricular systolic function is normal. The right ventricular  size is normal. There is normal pulmonary artery systolic pressure.   3. The mitral valve is grossly normal. No evidence of mitral valve  regurgitation.   4. The aortic valve is tricuspid. Aortic valve regurgitation is not  visualized. No aortic stenosis is present.   5. The inferior vena cava is normal in size with greater than 50%  respiratory variability, suggesting right atrial pressure of 3 mmHg.  Patient Profile   Travis [name] is a 72 y.o. male with a hx of HTN, HLD, OSA on CPAP, multiple myeloma on chemo/radiation therapy, persistent atrial fibrillation s/p ablation 2018, and recurrent DVT who is being seen 07/01/2022 for the evaluation of atrial fibrillation with RVR at the request of Dr. Erlinda Hong.  Assessment & Plan    1  Afib   Patient remains in afib but are better than yesterday    The pt was in [place] on 06/22/22 I do not think current pulmonary findings represent amiodarone lung toxicity     He does not tolerate afib    Failed flecanide       I would recomm continuing IV amiodarone  Increase metoprolol to 25 mg 4x daily   Continue Eliquis    2  HFpEF   In setting of afib and pulmonary process   May be contrib to pulmonary findings  Currently written for 40 mg !V x 2 (6 hours apart)   Follow output, follow renal function   3  HTN   Follow with changes   4   DVT  On Eliquis     For questions or updates, please contact [place] Please consult [email or web address] for contact info under        Signed, [name], MD  07/02/2022, 9:40 AM

## 2022-07-03 DIAGNOSIS — I480 Paroxysmal atrial fibrillation: Secondary | ICD-10-CM

## 2022-07-03 DIAGNOSIS — J9601 Acute respiratory failure with hypoxia: Secondary | ICD-10-CM | POA: Diagnosis not present

## 2022-07-03 DIAGNOSIS — I5033 Acute on chronic diastolic (congestive) heart failure: Secondary | ICD-10-CM

## 2022-07-03 LAB — BASIC METABOLIC PANEL
Anion gap: 11 (ref 5–15)
BUN: 31 mg/dL — ABNORMAL HIGH (ref 8–23)
CO2: 24 mmol/L (ref 22–32)
Calcium: 7.9 mg/dL — ABNORMAL LOW (ref 8.9–10.3)
Chloride: 102 mmol/L (ref 98–111)
Creatinine, Ser: 0.79 mg/dL (ref 0.61–1.24)
GFR, Estimated: >60 mL/min (ref >60)
Glucose, Bld: 221 mg/dL — ABNORMAL HIGH (ref 70–99)
Potassium: 4.3 mmol/L (ref 3.5–5.1)
Sodium: 137 mmol/L (ref 135–145)

## 2022-07-03 LAB — CBC
HCT: 29.8 % — ABNORMAL LOW (ref 39.0–52.0)
Hemoglobin: 9.8 g/dL — ABNORMAL LOW (ref 13.0–17.0)
MCH: 32.1 pg (ref 26.0–34.0)
MCHC: 32.9 g/dL (ref 30.0–36.0)
MCV: 97.7 fL (ref 80.0–100.0)
Platelets: 237 10*3/uL (ref 150–400)
RBC: 3.05 MIL/uL — ABNORMAL LOW (ref 4.22–5.81)
RDW: 16.2 % — ABNORMAL HIGH (ref 11.5–15.5)
WBC: 11.7 10*3/uL — ABNORMAL HIGH (ref 4.0–10.5)
nRBC: 0 % (ref 0.0–0.2)

## 2022-07-03 LAB — GLUCOSE, CAPILLARY
Glucose-Capillary: 214 mg/dL — ABNORMAL HIGH (ref 70–99)
Glucose-Capillary: 201 mg/dL — ABNORMAL HIGH (ref 70–99)
Glucose-Capillary: 247 mg/dL — ABNORMAL HIGH (ref 70–99)

## 2022-07-03 LAB — PHOSPHORUS: Phosphorus: 3.8 mg/dL (ref 2.5–4.6)

## 2022-07-03 LAB — MAGNESIUM: Magnesium: 2.1 mg/dL (ref 1.7–2.4)

## 2022-07-03 MED ORDER — PREDNISONE 20 MG PO TABS: 20.0000 mg | ORAL_TABLET | Freq: Every day | ORAL | Status: DC

## 2022-07-03 MED ORDER — FUROSEMIDE 10 MG/ML IJ SOLN
40.0000 mg | Freq: Four times a day (QID) | INTRAMUSCULAR | Status: AC
  Administered 2022-07-03 (×2): 40 mg via INTRAVENOUS
  Filled 2022-07-03 (×2): qty 4

## 2022-07-03 MED ORDER — HYDRALAZINE HCL 20 MG/ML IJ SOLN: 10.0000 mg | Freq: Four times a day (QID) | INTRAMUSCULAR | Status: DC | PRN

## 2022-07-03 MED ORDER — PREDNISONE 20 MG PO TABS: 40.0000 mg | ORAL_TABLET | Freq: Every day | ORAL | Status: DC

## 2022-07-03 MED ORDER — PREDNISONE 20 MG PO TABS
40.0000 mg | ORAL_TABLET | Freq: Every day | ORAL | Status: DC
  Administered 2022-07-04 – 2022-07-06 (×3): 40 mg via ORAL
  Filled 2022-07-03 (×3): qty 2

## 2022-07-03 MED ORDER — INSULIN ASPART 100 UNIT/ML IJ SOLN
0.0000 [IU] | Freq: Three times a day (TID) | INTRAMUSCULAR | Status: DC
  Administered 2022-07-03: 5 [IU] via SUBCUTANEOUS
  Administered 2022-07-04 – 2022-07-05 (×4): 3 [IU] via SUBCUTANEOUS
  Administered 2022-07-06: 2 [IU] via SUBCUTANEOUS

## 2022-07-03 MED ORDER — LISINOPRIL 20 MG PO TABS
40.0000 mg | ORAL_TABLET | Freq: Every day | ORAL | Status: DC
  Administered 2022-07-03 – 2022-07-06 (×4): 40 mg via ORAL
  Filled 2022-07-03: qty 2
  Filled 2022-07-03 (×2): qty 4
  Filled 2022-07-03: qty 2

## 2022-07-03 NOTE — Progress Notes (Signed)
NAME:  Travis [name], MRN:  [id number], DOB:  Mar 28, 1951, LOS: 4 ADMISSION DATE:  06/29/2022, CONSULTATION DATE:  07/01/2022 REFERRING MD:  Dr. Erlinda Hong, [facility], CHIEF COMPLAINT:  Short of breath   History of Present Illness:  72 yo male with metastatic multiple myeloma on chemotherapy presented to [facility] ER with dry cough, dyspnea, and leg swelling.  SpO2 83% on room air.  Found to have pulmonary infiltrates concerning for pneumonia.  Started on supplemental oxygen, antibiotics, and lasix.    Pertinent  Medical History  A fib, Arthritis, DVT, GERD, Hiatal hernia, HTN, Multiple myeloma, OSA, Peripheral neuropathy, Cervical spinal stenosis  Significant Hospital Events: Including procedures, antibiotic start and stop dates in addition to other pertinent events   2/27 Admit, start ABx 2/29 cardiology consulted 3/1 coronavirus non covid positive  Studies:  CT angio chest 06/29/22 >> widespread b/l ASD most in RUL, small bilateral R>L effusions, lytic lesions in Rt 6th rib/T11 spinous process/Lt L1 pedicle  Interim History / Subjective:  Mild improvement hypoxemia.  Diuresed well.  Objective   Blood pressure (!) 162/95, pulse 80, temperature 97.8 F (36.6 C), temperature source Oral, resp. rate 20, height '5\' 10"'$  (1.778 m), weight 108.9 kg, SpO2 94 %.        Intake/Output Summary (Last 24 hours) at 07/03/2022 1047 Last data filed at 07/03/2022 0600 Gross per 24 hour  Intake 481.31 ml  Output 2451 ml  Net -1969.69 ml    Filed Weights   07/01/22 1910 07/02/22 0705 07/03/22 0500  Weight: 108 kg 108.8 kg 108.9 kg    Examination:  General - alert Eyes - EOMI, no icterus ENT - no sinus tenderness, no stridor Cardiac - irregular Chest - NWOB, on salter, faint bilateral crackles. Abdomen - soft, non tender, + bowel sounds Extremities - no cyanosis, clubbing, or edema Skin - no rashes Neuro - normal strength, moves extremities, follows commands Psych - normal mood and behavior   Resolved  Hospital Problem list     Assessment & Plan:   Acute hypoxic respiratory failure with bilateral pulmonary infiltrates most likely due to Coronavirus OC43 pneumonia -  negative procalcitonin - less likely pulmonary toxicity from amio or velcade - Continue steroids, solumedrol 1 mg/kg for 3 days then oral taper to begin 3/3 - ok to discontinue antibiotics  Volume overload: Bilateral pleural effusions on prior CT this admission -- Diuresed well 3/1, kidney function electrolytes okay, continue lasix IV BID 3/2   Best Practice (right click and "Reselect all SmartList Selections" daily)   Diet/type: Regular consistency (see orders) DVT prophylaxis: DOAC GI prophylaxis: N/A Lines: N/A Foley:  N/A Code Status:  full code Last date of multidisciplinary goals of care discussion [n/a]  Labs   CBC: Recent Labs  Lab 06/29/22 1512 06/30/22 0402 07/03/22 0257  WBC 9.0 7.6 11.7*  HGB 9.7* 10.0* 9.8*  HCT 29.8* 29.7* 29.8*  MCV 98.0 97.7 97.7  PLT 231 211 237     Basic Metabolic Panel: Recent Labs  Lab 06/29/22 1512 06/30/22 0402 07/01/22 0348 07/02/22 0346 07/03/22 0257  NA 133* 136 135 133* 137  K 3.2* 3.1* 3.6 3.7 4.3  CL 102 104 104 104 102  CO2 '24 25 24 '$ 21* 24  GLUCOSE 174* 117* 138* 197* 221*  BUN '17 16 17 20 '$ 31*  CREATININE 0.79 0.79 0.72 0.76 0.79  CALCIUM 8.0* 7.4* 7.7* 7.9* 7.9*  MG  --   --  1.7  --  2.1  PHOS  --   --  2.7  --  3.8    GFR: Estimated Creatinine Clearance: 104.7 mL/min (by C-G formula based on SCr of 0.79 mg/dL). Recent Labs  Lab 06/29/22 1512 06/30/22 0402 07/01/22 0348 07/02/22 0346 07/03/22 0257  PROCALCITON  --   --  <0.10 <0.10  --   WBC 9.0 7.6  --   --  11.7*     Liver Function Tests: Recent Labs  Lab 06/29/22 1512  AST 19  ALT 24  ALKPHOS 78  BILITOT 0.7  PROT 5.2*  ALBUMIN 2.4*    No results for input(s): "LIPASE", "AMYLASE" in the last 168 hours. No results for input(s): "AMMONIA" in the last 168  hours.  ABG    Component Value Date/Time   PHART 7.358 06/04/2022 1406   PCO2ART 39.3 06/04/2022 1406   PO2ART 238 (H) 06/04/2022 1406   HCO3 22.3 06/04/2022 1406   TCO2 23 06/04/2022 1406   ACIDBASEDEF 3.0 (H) 06/04/2022 1406   O2SAT 100 06/04/2022 1406     Coagulation Profile: No results for input(s): "INR", "PROTIME" in the last 168 hours.  Cardiac Enzymes: No results for input(s): "CKTOTAL", "CKMB", "CKMBINDEX", "TROPONINI" in the last 168 hours.  HbA1C: Hgb A1c MFr Bld  Date/Time Value Ref Range Status  06/02/2022 04:41 AM 5.5 4.8 - 5.6 % Final    Comment:    (NOTE) Pre diabetes:          5.7%-6.4%  Diabetes:              >6.4%  Glycemic control for   <7.0% adults with diabetes   09/18/2020 01:23 PM 5.8 (H) 4.8 - 5.6 % Final    Comment:    (NOTE) Pre diabetes:          5.7%-6.4%  Diabetes:              >6.4%  Glycemic control for   <7.0% adults with diabetes     CBG: No results for input(s): "GLUCAP" in the last 168 hours.  Review of Systems:   Reviewed and negative  Past Medical History:  He,  has a past medical history of A-fib ([place]), Arthritis, Complication of anesthesia, DVT (deep venous thrombosis) ([place]), Dysrhythmia, GERD (gastroesophageal reflux disease), History of hiatal hernia, Hypertension, Impaired glucose tolerance, Multiple myeloma ([place]), Obesity (BMI 30-39.9) (07/26/2016), OSA on CPAP (07/26/2016), PAF (paroxysmal atrial fibrillation) ([place]), Pneumonia, Pre-diabetes, and Thrombophlebitis.   Surgical History:   Past Surgical History:  Procedure Laterality Date   ATRIAL FIBRILLATION ABLATION  04/05/2017   ATRIAL FIBRILLATION ABLATION N/A 04/05/2017   Procedure: ATRIAL FIBRILLATION ABLATION;  Surgeon: Thompson Grayer, MD;  Location: [place] [facility];  Service: Cardiovascular;  Laterality: N/A;   BONE BIOPSY Right 12/17/2021   Procedure: BONE BIOPSY;  Surgeon: Hiram Gash, MD;  Location: [place];  Service: Orthopedics;   Laterality: Right;   COMPLETE RIGHT HIP REPLACMENT  01/19/2019   EVALUATION UNDER ANESTHESIA WITH HEMORRHOIDECTOMY N/A 02/24/2017   Procedure: EXAM UNDER ANESTHESIA WITH  HEMORRHOIDECTOMY;  Surgeon: Michael Boston, MD;  Location: [facility];  Service: General;  Laterality: N/A;   EXCISIONAL TOTAL HIP ARTHROPLASTY WITH ANTIBIOTIC SPACERS Right 09/25/2020   Procedure: EXCISIONAL TOTAL HIP ARTHROPLASTY WITH ANTIBIOTIC SPACERS;  Surgeon: Paralee Cancel, MD;  Location: [facility];  Service: Orthopedics;  Laterality: Right;   FLEXIBLE SIGMOIDOSCOPY N/A 04/15/2015   Procedure:  UNSEDATED FLEXIBLE SIGMOIDOSCOPY;  Surgeon: [name], MD;  Location: [facility];  Service: Endoscopy;  Laterality: N/A;   HUMERUS IM NAIL  Right 12/17/2021   Procedure: INTRAMEDULLARY (IM) NAIL HUMERAL;  Surgeon: Hiram Gash, MD;  Location: [place];  Service: Orthopedics;  Laterality: Right;   KNEE ARTHROSCOPY Left ~ 2016   POSTERIOR CERVICAL FUSION/FORAMINOTOMY N/A 06/04/2022   Procedure: Thoracic one - Thoracic Four Posterior lateral arthrodesis/ fusion and Thoracic two Corpectomy;  Surgeon: Ashok Pall, MD;  Location: [place];  Service: Neurosurgery;  Laterality: N/A;   REIMPLANTATION OF TOTAL HIP Right 12/25/2020   Procedure: REIMPLANTATION/REVISION OF RIGHT TOTAL HIP VERSUS REPEAT IRRIGATION AND DEBRIDEMENT;  Surgeon: Paralee Cancel, MD;  Location: [facility];  Service: Orthopedics;  Laterality: Right;   TESTICLE SURGERY  1988   VARICOCELE   TONSILLECTOMY     VARICOSE VEIN SURGERY Bilateral      Social History:   reports that he has never smoked. He has never used smokeless tobacco. He reports current alcohol use of about 2.0 standard drinks of alcohol per week. He reports that he does not use drugs.   Family History:  His family history includes Atrial fibrillation in his father; Dementia in his maternal grandmother and mother; Hypertension in his father and paternal grandfather; Stroke in his father.    Allergies Allergies  Allergen Reactions   Linezolid Other (See Comments)    Burning tongue    Vancomycin Rash     Home Medications  Prior to Admission medications   Medication Sig Start Date End Date Taking? Authorizing Provider  acetaminophen (TYLENOL) 325 MG tablet Take 2 tablets (650 mg total) by mouth every 6 (six) hours as needed. Patient taking differently: Take 650 mg by mouth every 6 (six) hours as needed for mild pain or headache. 03/15/22  Yes Jeanell Sparrow, DO  acyclovir (ZOVIRAX) 400 MG tablet Take 1 tablet (400 mg total) by mouth 2 (two) times daily. 12/10/21  Yes Orson Slick, MD  amiodarone (PACERONE) 200 MG tablet Take 1 tablet (200 mg total) by mouth daily. 05/12/22  Yes Vickie Epley, MD  amLODipine (NORVASC) 10 MG tablet Take 10 mg by mouth daily.   Yes [provider]  apixaban (ELIQUIS) 5 MG TABS tablet Take 1 tablet (5 mg total) by mouth 2 (two) times daily. 06/29/22  Yes Nita Sells, MD  cholecalciferol (VITAMIN D3) 25 MCG (1000 UNIT) tablet Take 1,000 Units by mouth daily.   Yes [provider]  Cyanocobalamin (VITAMIN B-12 PO) Take 1,000 mcg by mouth daily.   Yes [provider]  dexamethasone (DECADRON) 4 MG tablet Take 4 mg by mouth See admin instructions. Take 4 mg by mouth once a day for 3 days after chemo on Wednesday- or unless otherwise instructed   Yes [provider]  diltiazem (CARDIZEM CD) 240 MG 24 hr capsule Take 1 capsule (240 mg total) by mouth daily. 06/23/22  Yes Nita Sells, MD  fluticasone (FLONASE) 50 MCG/ACT nasal spray Place 1 spray into both nostrils daily as needed for allergies or rhinitis.   Yes [provider]  folic acid (FOLVITE) 1 MG tablet Take 1 mg by mouth daily. 11/12/19  Yes [provider]  furosemide (LASIX) 40 MG tablet Take 40 mg by mouth daily as needed for edema.   Yes [provider]  gabapentin (NEURONTIN) 300 MG capsule TAKE TWO  CAPSULES BY MOUTH TWICE A DAY Patient taking differently: Take 600 mg by mouth 2 (two) times daily. 03/29/22  Yes Patel, Donika K, DO  hydrALAZINE (APRESOLINE) 25 MG tablet Take 1.5 tablets (37.5 mg total)  by mouth 3 (three) times daily. 05/12/22  Yes Vickie Epley, MD  HYDROcodone-acetaminophen (NORCO) 7.5-325 MG tablet Take 1 tablet by mouth every 6 (six) hours as needed for moderate pain. 06/18/22  Yes [provider]  lisinopril (ZESTRIL) 40 MG tablet TAKE ONE TABLET BY MOUTH ONE TIME DAILY Patient taking differently: Take 40 mg by mouth daily. 08/12/21  Yes Fay Records, MD  methocarbamol (ROBAXIN) 750 MG tablet Take 750 mg by mouth every 8 (eight) hours as needed for muscle spasms. 06/16/22  Yes [provider]  pomalidomide (POMALYST) 4 MG capsule Take 4 mg by mouth See admin instructions. Take 4 mg by mouth once a day for 21 days "on," then 7 days "off." Repeat every 28 days.   Yes [provider]  potassium chloride SA (KLOR-CON M) 20 MEQ tablet Take 2 tablets (40 mEq total) by mouth daily. 06/22/22  Yes Nita Sells, MD  sodium chloride (OCEAN) 0.65 % SOLN nasal spray Place 1 spray into both nostrils as needed for congestion.   Yes [provider]  traMADol (ULTRAM) 50 MG tablet Take 1 tablet (50 mg total) by mouth every 6 (six) hours as needed. Patient taking differently: Take 50 mg by mouth every 6 (six) hours as needed for moderate pain. 05/18/22  Yes Dede Query T, PA-C  apixaban (ELIQUIS) 5 MG TABS tablet Take 2 tablets (10 mg total) by mouth 2 (two) times daily for 7 days. Patient not taking: Reported on 06/30/2022 06/22/22 06/30/22  Nita Sells, MD  Omega-3 Fatty Acids (FISH OIL PO) Take 1 Capful by mouth daily. Patient not taking: Reported on 06/30/2022    [provider]     Signature:  Lanier Clam, MD [place] for contact info If no response, contact [phone] pager until 7  pm, between 7 pm and 7 am please contact E-link 07/03/2022, 10:47 AM

## 2022-07-03 NOTE — Progress Notes (Signed)
PROGRESS NOTE    Travis [name]  [id number] DOB: [birth year] DOA: 06/29/2022 PCP: [name], MD     Brief Narrative:   OSA on CPAP, multiple myeloma currently on oral chemotherapy and radiation therapy, who has known atrial fibrillation on Eliquis, his anticoagulation was recently held at the beginning of February due to pathologic thoracic spine fracture, requiring posterior lateral arthrodesis/fusion and corpectomy for tumor resection with Dr. Christella Noa on 2/2. Recently discharged on 2/20 with new DVT left peroneal vein who presents with shortness of breath and hypoxia.    Subjective:  On 6liter HFNC at rest, on amiodarone drip, remain in afib, heart rate controlled ,  2.4 liters urine output documented last 24 hours, blood pressure elevated, slightly bump of bun, creatinine stable No fever , no pain  Continue to have trace pitting edema bilateral lower extremity ,will do ted hose  Report start to feel light headed and sleepy this morning   Assessment & Plan:  Principal Problem:   Acute hypoxic respiratory failure ([place]) Active Problems:   Hypertension   Atrial fibrillation with RVR (HCC)   OSA (obstructive sleep apnea)   Smoldering multiple myeloma   History of DVT (deep vein thrombosis)   CAP (community acquired pneumonia)   Hypokalemia   Hyponatremia   History of thoracic surgery   Pulmonary infiltrates   Acute respiratory failure with hypoxemia (HCC)   Acute on chronic diastolic heart failure (HCC)    Assessment and Plan:   * Acute hypoxic respiratory failure ([place]), not on home O2 at baseline, likely from viral pneumonia and acute on chronic dCHF --CTA chest negative for PE with multifocal bilateral airspace disease consistent with bilateral pneumonia. Small bilateral pleural effusions. -respiratory viral panel +Coronavirus OC43 -clinically not fever, no Leukocytosis, does not appear septic, urine strep pneumo antigen negative  and procalcitonin  level less than 0.1 -was on empiric IV Rocephin and Azithromcyin then doxy ( changed zithro to doxcy due to being started on amiodarone to avoid Qtc issues) , abx stopped per pulmonology recommendation as less likely bacterial pneumonia -continue steroids and lasix, Follow pulmonology recommendation -Daily weight, follow intake and output -keep in stepdown, wean O2 as tolerated with goal of >92%  Light headedness Will check orthostatic vital signs Ted hose   Steroid induced hyperglycemia No prior diagnosis of diabetes, recent A1c 5.1 Taper steroids, carb modified diet, start ssi   Atrial fibrillation with RVR ([place]) - Likely from ongoing pneumonia -continue eliquis -rate not well controlled on max dose of cardizem drip, cardiology consulted, started on amiodarone drip, also on bb -will follow cardiology recommendation      Hypervolemic hyponatremia Sodium 133 on presentation, sodium normalized   Hypokalemia/hypomagnesemia Supplement prn to keep K>4, mag >2  Hypertension -continue home antihypertensives , adjust prn   Recent history of pathologic thoracic spine fracture s/p posterior lateral arthrodesis/fusion and corpectomy on 06/04/22 with neurosurgery  -recommend ambulation and OOB daily   History of DVT (deep vein thrombosis) DX 06/21/22 following discontinuation of anticoagulation for recent spinal surgery -will complete 7 days of higher dose Eliquis tonight. 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 Lorenso Courier -acyclovir '400mg'$  PO BID for VCZ  prophylaxis   Per oncology note, patient had rash concerning for shingles, required valcyclovir  x 10days from 2/21, otherwise on acyclorvi 400bid for VCZ prophylaxis   OSA (obstructive sleep apnea) -continue nightly CPAP       I have Reviewed nursing notes, Vitals, pain scores, I/o's, Lab results and  imaging results since pt's last encounter, details please see discussion above  I ordered the following labs:  Unresulted Labs (From admission, onward)     Start     Ordered   07/04/22 [id number]  Basic metabolic panel  Daily,   R     Question:  Specimen collection method  Answer:  Lab=Lab collect   07/03/22 0752   07/04/22 0500  Brain natriuretic peptide  Tomorrow morning,   R       Question:  Specimen collection method  Answer:  Lab=Lab collect   07/03/22 0752             DVT prophylaxis: Place TED hose Start: 07/03/22 1129 apixaban (ELIQUIS) tablet 5 mg   Code Status:   Code Status: Full Code  Family Communication: wife over the phone on 2/29 Disposition:    Dispo: The patient is from: Home              Anticipated d/c is to: Home              Anticipated d/c date is: remain in stepdown today, reports not feeling so well today, likely home some time next week once able to wean off oxygen  Antimicrobials:   Anti-infectives (From admission, onward)    Start     Dose/Rate Route Frequency Ordered Stop   07/02/22 1000  doxycycline (VIBRA-TABS) tablet 100 mg  Status:  Discontinued        100 mg Oral Every 12 hours 07/01/22 0951 07/03/22 1052   07/01/22 1000  azithromycin (ZITHROMAX) tablet 500 mg  Status:  Discontinued        500 mg Oral Daily 06/30/22 1234 07/01/22 0951   06/30/22 1800  cefTRIAXone (ROCEPHIN) 1 g in sodium chloride 0.9 % 100 mL IVPB  Status:  Discontinued        1 g 200 mL/hr over 30 Minutes Intravenous Every 24 hours 06/29/22 1933 07/03/22 1052   06/30/22 1000  acyclovir (ZOVIRAX) tablet 400 mg        400 mg Oral 2 times daily 06/30/22 0024     06/30/22 0600  azithromycin (ZITHROMAX) 500 mg in sodium chloride 0.9 % 250 mL IVPB  Status:  Discontinued         500 mg 250 mL/hr over 60 Minutes Intravenous Every 24 hours 06/29/22 1933 06/30/22 1234   06/29/22 1830  cefTRIAXone (ROCEPHIN) 1 g in sodium chloride 0.9 % 100 mL IVPB        1 g 200 mL/hr over 30 Minutes Intravenous  Once 06/29/22 1822 06/29/22 1910   06/29/22 1830  doxycycline (VIBRA-TABS) tablet 100 mg        100 mg Oral  Once 06/29/22 1822 06/29/22 1840           Objective: Vitals:   07/03/22 0500 07/03/22 0528 07/03/22 0600 07/03/22 0800  BP: (!) 156/111 (!) 159/104 (!) 162/95   Pulse: (!) 48 87 80   Resp: '16 20 20   '$ Temp:    97.8 F (36.6 C)  TempSrc:    Oral  SpO2: 98% 93% 94%   Weight: 108.9 kg     Height:  Intake/Output Summary (Last 24 hours) at 07/03/2022 1140 Last data filed at 07/03/2022 0600 Gross per 24 hour  Intake 481.31 ml  Output 2051 ml  Net -1569.69 ml   Filed Weights   07/01/22 1910 07/02/22 0705 07/03/22 0500  Weight: 108 kg 108.8 kg 108.9 kg    Examination:  General exam: AAOx3,  chronically ill-appearing Respiratory system: .  Improved aeration, no wheezing, no rhonchi, respiratory effort normal. Cardiovascular system:  IRRR.  Gastrointestinal system: Abdomen is nondistended, soft and nontender.  Normal bowel sounds heard. Central nervous system: Alert and oriented. No focal neurological deficits. Extremities: Trace bilateral lower extremity pitting edema Skin: No rashes, lesions or ulcers Psychiatry: Judgement and insight appear normal. Mood & affect appropriate.     Data Reviewed: I have personally reviewed  labs and visualized  imaging studies since the last encounter and formulate the plan        Scheduled Meds:  acyclovir  400 mg Oral BID   apixaban  5 mg Oral BID   Chlorhexidine Gluconate Cloth  6 each Topical Daily   furosemide  40 mg Intravenous Q6H   guaiFENesin  600 mg Oral BID   insulin aspart  0-15 Units Subcutaneous TID WC   metoprolol tartrate  25 mg Oral QID   [START ON 07/04/2022] predniSONE  40 mg  Oral Q breakfast   Followed by   Derrill Memo ON 07/11/2022] predniSONE  20 mg Oral Q breakfast   Continuous Infusions:  amiodarone 30 mg/hr (07/03/22 0600)     LOS: 4 days   Time spent:  67mns  FFlorencia Reasons MD PhD FACP [facility]  Available via Epic secure chat 7am-7pm for nonurgent issues Please page for urgent issues To page the attending provider between 7A-7P or the covering provider during after hours 7P-7A, please log into the web site [email or web address] and access using universal [place] password for that web site. If you do not have the password, please call the hospital operator.    07/03/2022, 11:40 AM

## 2022-07-03 NOTE — Progress Notes (Addendum)
Rounding Note    Patient Name: Travis [name] Date of Encounter: 07/03/2022  [place] Cardiologist: [name], MD   Subjective  Net negative 1.3L yesterday, negative 3L on admission.  Cr stable at 0.79.  Reports dyspnea improving.  Denies any chest pain.  Inpatient Medications    Scheduled Meds:  acyclovir  400 mg Oral BID   apixaban  5 mg Oral BID   Chlorhexidine Gluconate Cloth  6 each Topical Daily   doxycycline  100 mg Oral Q12H   furosemide  40 mg Intravenous Q6H   guaiFENesin  600 mg Oral BID   methylPREDNISolone (SOLU-MEDROL) injection  125 mg Intravenous Daily   metoprolol tartrate  25 mg Oral QID   [START ON 07/04/2022] predniSONE  40 mg Oral Q breakfast   Followed by   Derrill Memo ON 07/11/2022] predniSONE  20 mg Oral Q breakfast   Continuous Infusions:  amiodarone 30 mg/hr (07/03/22 0600)   cefTRIAXone (ROCEPHIN)  IV Stopped (07/02/22 1801)   PRN Meds: acetaminophen, docusate sodium, fluticasone, mouth rinse, sodium chloride   Vital Signs    Vitals:   07/03/22 0400 07/03/22 0500 07/03/22 0528 07/03/22 0600  BP: (!) 157/94 (!) 156/111 (!) 159/104 (!) 162/95  Pulse: 79 (!) 48 87 80  Resp: '17 16 20 20  '$ Temp: 97.6 F (36.4 C)     TempSrc: Oral     SpO2: 97% 98% 93% 94%  Weight:  108.9 kg    Height:        Intake/Output Summary (Last 24 hours) at 07/03/2022 0803 Last data filed at 07/03/2022 0600 Gross per 24 hour  Intake 481.31 ml  Output 2451 ml  Net -1969.69 ml       07/03/2022    5:00 AM 07/02/2022    7:05 AM 07/01/2022    7:10 PM  Last 3 Weights  Weight (lbs) 240 lb 1.3 oz 239 lb 13.8 oz 238 lb 1.6 oz  Weight (kg) 108.9 kg 108.8 kg 108 kg      Telemetry    AFib   80s  - Personally Reviewed  ECG    No new  - Personally Reviewed  Physical Exam   GEN: No acute distress.   Neck: Difficult to assess JVD Cardiac: irregular, normal rate no murmurs, Respiratory: Diminished at bases GI: Soft, nontender,  MS: 1+ RLE edema Neuro:   Nonfocal  Psych: Normal affect   Labs    High Sensitivity Troponin:  No results for input(s): "TROPONINIHS" in the last 720 hours.   Chemistry Recent Labs  Lab 06/29/22 1512 06/30/22 0402 07/01/22 0348 07/02/22 0346 07/03/22 0257  NA 133*   < > 135 133* 137  K 3.2*   < > 3.6 3.7 4.3  CL 102   < > 104 104 102  CO2 24   < > 24 21* 24  GLUCOSE 174*   < > 138* 197* 221*  BUN 17   < > 17 20 31*  CREATININE 0.79   < > 0.72 0.76 0.79  CALCIUM 8.0*   < > 7.7* 7.9* 7.9*  MG  --   --  1.7  --  2.1  PROT 5.2*  --   --   --   --   ALBUMIN 2.4*  --   --   --   --   AST 19  --   --   --   --   ALT 24  --   --   --   --  ALKPHOS 78  --   --   --   --   BILITOT 0.7  --   --   --   --   GFRNONAA >60   < > >60 >60 >60  ANIONGAP 7   < > '7 8 11   '$ < > = values in this interval not displayed.     Lipids No results for input(s): "CHOL", "TRIG", "HDL", "LABVLDL", "LDLCALC", "CHOLHDL" in the last 168 hours.  Hematology Recent Labs  Lab 06/29/22 1512 06/30/22 0402 07/03/22 0257  WBC 9.0 7.6 11.7*  RBC 3.04* 3.04* 3.05*  HGB 9.7* 10.0* 9.8*  HCT 29.8* 29.7* 29.8*  MCV 98.0 97.7 97.7  MCH 31.9 32.9 32.1  MCHC 32.6 33.7 32.9  RDW 16.3* 16.4* 16.2*  PLT 231 211 237    Thyroid No results for input(s): "TSH", "FREET4" in the last 168 hours.  BNP Recent Labs  Lab 06/29/22 1512  BNP 785.8*     DDimer No results for input(s): "DDIMER" in the last 168 hours.   Radiology    DG CHEST PORT 1 VIEW  Result Date: 07/01/2022 CLINICAL DATA:  Hypoxia EXAM: PORTABLE CHEST 1 VIEW COMPARISON:  Portable exam 1054 hours compared to 06/29/2022 FINDINGS: Normal heart size mediastinal contours. Extensive BILATERAL patchy airspace infiltrates consistent with multifocal pneumonia, little changed. No pleural effusion or pneumothorax. Bones demineralized with evidence of prior cervicothoracic fusion and proximal RIGHT humeral nailing. IMPRESSION: Patchy BILATERAL pulmonary infiltrates consistent with  multifocal pneumonia, little changed. Electronically Signed   By: Lavonia Dana M.D.   On: 07/01/2022 11:57    Cardiac Studies  Echo   06/22/22  1. Appears to be in sinus rhythm at the time of evaluation- A waves  present on diastology. Left ventricular ejection fraction, by estimation,  is 60 to 65%. The left ventricle has normal function. The left ventricle  has no regional wall motion  abnormalities. There is mild concentric left ventricular hypertrophy. Left  ventricular diastolic parameters are consistent with Grade II diastolic  dysfunction (pseudonormalization).   2. Right ventricular systolic function is normal. The right ventricular  size is normal. There is normal pulmonary artery systolic pressure.   3. The mitral valve is grossly normal. No evidence of mitral valve  regurgitation.   4. The aortic valve is tricuspid. Aortic valve regurgitation is not  visualized. No aortic stenosis is present.   5. The inferior vena cava is normal in size with greater than 50%  respiratory variability, suggesting right atrial pressure of 3 mmHg.  Patient Profile   Travis [name] is a 72 y.o. male with a hx of HTN, HLD, OSA on CPAP, multiple myeloma on chemo/radiation therapy, persistent atrial fibrillation s/p ablation 2018, and recurrent DVT who is being seen 07/01/2022 for the evaluation of atrial fibrillation with RVR at the request of Dr. Erlinda Hong.  Assessment & Plan     Atrial fibrillation: Paroxysmal, was in sinus rhythm at time of echocardiogram 06/22/2022. -Continue Eliquis -Continue IV amiodarone -Currently on metoprolol 25 mg 4 times daily, rates appear controlled.  Will consolidate prior to discharge  Acute on chronic diastolic heart failure: Echocardiogram 06/22/2022 showed EF 60 to 123456, grade 2 diastolic dysfunction, normal RV function, no significant valvular disease.  BNP 786 -Continue IV Lasix  Acute hypoxic respiratory failure: Improving, currently on 4L [place]. Suspected due to  coronavirus pneumonia.  On steroids.  Acute on chronic diastolic heart failure likely contributing.  Continue diuresis.  DVT: Diagnosed 06/21/2022 after anticoagulation was  discontinued for spinal surgery.  Currently on Eliquis  Multiple myeloma: On chemotherapy    For questions or updates, please contact [place] Please consult [email or web address] for contact info under        Signed, Donato Heinz, MD  07/03/2022, 8:03 AM

## 2022-07-04 DIAGNOSIS — J9601 Acute respiratory failure with hypoxia: Secondary | ICD-10-CM | POA: Diagnosis not present

## 2022-07-04 LAB — GLUCOSE, CAPILLARY
Glucose-Capillary: 154 mg/dL — ABNORMAL HIGH (ref 70–99)
Glucose-Capillary: 210 mg/dL — ABNORMAL HIGH (ref 70–99)
Glucose-Capillary: 187 mg/dL — ABNORMAL HIGH (ref 70–99)
Glucose-Capillary: 154 mg/dL — ABNORMAL HIGH (ref 70–99)
Glucose-Capillary: 188 mg/dL — ABNORMAL HIGH (ref 70–99)

## 2022-07-04 LAB — BASIC METABOLIC PANEL
Anion gap: 12 (ref 5–15)
BUN: 37 mg/dL — ABNORMAL HIGH (ref 8–23)
CO2: 23 mmol/L (ref 22–32)
Calcium: 7.7 mg/dL — ABNORMAL LOW (ref 8.9–10.3)
Chloride: 101 mmol/L (ref 98–111)
Creatinine, Ser: 0.83 mg/dL (ref 0.61–1.24)
GFR, Estimated: >60 mL/min (ref >60)
Glucose, Bld: 185 mg/dL — ABNORMAL HIGH (ref 70–99)
Potassium: 3.6 mmol/L (ref 3.5–5.1)
Sodium: 136 mmol/L (ref 135–145)

## 2022-07-04 LAB — BRAIN NATRIURETIC PEPTIDE: B Natriuretic Peptide: 293.9 pg/mL — ABNORMAL HIGH (ref 0.0–100.0)

## 2022-07-04 LAB — MAGNESIUM: Magnesium: 2 mg/dL (ref 1.7–2.4)

## 2022-07-04 MED ORDER — POTASSIUM CHLORIDE CRYS ER 20 MEQ PO TBCR
40.0000 meq | EXTENDED_RELEASE_TABLET | Freq: Once | ORAL | Status: AC
  Administered 2022-07-04: 40 meq via ORAL
  Filled 2022-07-04: qty 2

## 2022-07-04 MED ORDER — HYDRALAZINE HCL 25 MG PO TABS
25.0000 mg | ORAL_TABLET | Freq: Three times a day (TID) | ORAL | Status: DC
  Administered 2022-07-04 – 2022-07-06 (×8): 25 mg via ORAL
  Filled 2022-07-04 (×8): qty 1

## 2022-07-04 MED ORDER — METOPROLOL TARTRATE 50 MG PO TABS
50.0000 mg | ORAL_TABLET | Freq: Two times a day (BID) | ORAL | Status: DC
  Administered 2022-07-04 – 2022-07-06 (×5): 50 mg via ORAL
  Filled 2022-07-04 (×4): qty 1
  Filled 2022-07-04: qty 2

## 2022-07-04 MED ORDER — FUROSEMIDE 10 MG/ML IJ SOLN
40.0000 mg | Freq: Once | INTRAMUSCULAR | Status: AC
  Administered 2022-07-04: 40 mg via INTRAVENOUS
  Filled 2022-07-04: qty 4

## 2022-07-04 NOTE — Progress Notes (Addendum)
Rounding Note    Patient Name: Travis [name] Date of Encounter: 07/04/2022  [place] Cardiologist: [name], MD   Subjective  Net negative 1.4L yesterday, negative 4.4L on admission.  Cr stable at 0.83.  BP 163/100.  Reports dyspnea improved.  Denies any chest pain  Inpatient Medications    Scheduled Meds:  acyclovir  400 mg Oral BID   apixaban  5 mg Oral BID   Chlorhexidine Gluconate Cloth  6 each Topical Daily   guaiFENesin  600 mg Oral BID   insulin aspart  0-15 Units Subcutaneous TID WC   lisinopril  40 mg Oral Daily   metoprolol tartrate  25 mg Oral QID   potassium chloride  40 mEq Oral Once   predniSONE  40 mg Oral Q breakfast   Followed by   Derrill Memo ON 07/11/2022] predniSONE  20 mg Oral Q breakfast   Continuous Infusions:  amiodarone 30 mg/hr (07/04/22 0520)   PRN Meds: acetaminophen, docusate sodium, fluticasone, hydrALAZINE, mouth rinse, sodium chloride   Vital Signs    Vitals:   07/04/22 0200 07/04/22 0300 07/04/22 0400 07/04/22 0500  BP: (!) 173/110 (!) 163/100    Pulse: 96 78    Resp: 20 16    Temp:   98.4 F (36.9 C)   TempSrc:   Oral   SpO2: 96% 98%    Weight:    108.3 kg  Height:        Intake/Output Summary (Last 24 hours) at 07/04/2022 0754 Last data filed at 07/04/2022 0300 Gross per 24 hour  Intake 349.93 ml  Output 1700 ml  Net -1350.07 ml       07/04/2022    5:00 AM 07/03/2022    5:00 AM 07/02/2022    7:05 AM  Last 3 Weights  Weight (lbs) 238 lb 12.1 oz 240 lb 1.3 oz 239 lb 13.8 oz  Weight (kg) 108.3 kg 108.9 kg 108.8 kg      Telemetry    AFib   80-90s  - Personally Reviewed  ECG    No new  - Personally Reviewed  Physical Exam   GEN: No acute distress.   Neck: Difficult to assess JVD Cardiac: irregular, normal rate no murmurs, Respiratory: Diminished at bases GI: Soft, nontender,  MS: trace edema Neuro:  Nonfocal  Psych: Normal affect   Labs    High Sensitivity Troponin:  No results for input(s):  "TROPONINIHS" in the last 720 hours.   Chemistry Recent Labs  Lab 06/29/22 1512 06/30/22 0402 07/01/22 0348 07/02/22 0346 07/03/22 0257 07/04/22 0321  NA 133*   < > 135 133* 137 136  K 3.2*   < > 3.6 3.7 4.3 3.6  CL 102   < > 104 104 102 101  CO2 24   < > 24 21* 24 23  GLUCOSE 174*   < > 138* 197* 221* 185*  BUN 17   < > 17 20 31* 37*  CREATININE 0.79   < > 0.72 0.76 0.79 0.83  CALCIUM 8.0*   < > 7.7* 7.9* 7.9* 7.7*  MG  --   --  1.7  --  2.1 2.0  PROT 5.2*  --   --   --   --   --   ALBUMIN 2.4*  --   --   --   --   --   AST 19  --   --   --   --   --   ALT  24  --   --   --   --   --   ALKPHOS 78  --   --   --   --   --   BILITOT 0.7  --   --   --   --   --   GFRNONAA >60   < > >60 >60 >60 >60  ANIONGAP 7   < > '7 8 11 12   '$ < > = values in this interval not displayed.     Lipids No results for input(s): "CHOL", "TRIG", "HDL", "LABVLDL", "LDLCALC", "CHOLHDL" in the last 168 hours.  Hematology Recent Labs  Lab 06/29/22 1512 06/30/22 0402 07/03/22 0257  WBC 9.0 7.6 11.7*  RBC 3.04* 3.04* 3.05*  HGB 9.7* 10.0* 9.8*  HCT 29.8* 29.7* 29.8*  MCV 98.0 97.7 97.7  MCH 31.9 32.9 32.1  MCHC 32.6 33.7 32.9  RDW 16.3* 16.4* 16.2*  PLT 231 211 237    Thyroid No results for input(s): "TSH", "FREET4" in the last 168 hours.  BNP Recent Labs  Lab 06/29/22 1512 07/04/22 0321  BNP 785.8* 293.9*     DDimer No results for input(s): "DDIMER" in the last 168 hours.   Radiology    No results found.  Cardiac Studies  Echo   06/22/22  1. Appears to be in sinus rhythm at the time of evaluation- A waves  present on diastology. Left ventricular ejection fraction, by estimation,  is 60 to 65%. The left ventricle has normal function. The left ventricle  has no regional wall motion  abnormalities. There is mild concentric left ventricular hypertrophy. Left  ventricular diastolic parameters are consistent with Grade II diastolic  dysfunction (pseudonormalization).   2. Right  ventricular systolic function is normal. The right ventricular  size is normal. There is normal pulmonary artery systolic pressure.   3. The mitral valve is grossly normal. No evidence of mitral valve  regurgitation.   4. The aortic valve is tricuspid. Aortic valve regurgitation is not  visualized. No aortic stenosis is present.   5. The inferior vena cava is normal in size with greater than 50%  respiratory variability, suggesting right atrial pressure of 3 mmHg.  Patient Profile   DERRECK FORTUNA is a 72 y.o. male with a hx of HTN, HLD, OSA on CPAP, multiple myeloma on chemo/radiation therapy, persistent atrial fibrillation s/p ablation 2018, and recurrent DVT who is being seen 07/01/2022 for the evaluation of atrial fibrillation with RVR at the request of Dr. Erlinda Hong.  Assessment & Plan     Atrial fibrillation: Paroxysmal, was in sinus rhythm at time of echocardiogram 06/22/2022. -Continue Eliquis -Continue IV amiodarone for now -Currently on metoprolol 25 mg 4 times daily, rates appear controlled.  Will consolidate to 50 mg BID  Acute on chronic diastolic heart failure: Echocardiogram 06/22/2022 showed EF 60 to 123456, grade 2 diastolic dysfunction, normal RV function, no significant valvular disease.  BNP 786 -Good diuresis.  Continue IV Lasix  Acute hypoxic respiratory failure: Improving, currently on 4L [place]. Suspected due to coronavirus pneumonia.  On steroids.  Acute on chronic diastolic heart failure likely contributing.  Continue diuresis.  DVT: Diagnosed 06/21/2022 after anticoagulation was discontinued for spinal surgery.  Currently on Eliquis  Multiple myeloma: On chemotherapy  Hypertension:home regimen diltiazem 240 mg daily, lisinopril 40 mg daily, hydralazine 37.5 mg TID -Currently on metoprolol 25 mg qid and lisinopril 40 mg daily.  BP remains elevated, will add hydralazine 25 mg TID    For  questions or updates, please contact [place] Please consult [email or web address]  for contact info under        Signed, Donato Heinz, MD  07/04/2022, 7:54 AM

## 2022-07-04 NOTE — Progress Notes (Signed)
PROGRESS NOTE    Travis [name]  [id number] DOB: 12-09-1950 DOA: 06/29/2022 PCP: [name], MD     Brief Narrative:   OSA on CPAP, multiple myeloma currently on oral chemotherapy and radiation therapy, who has known atrial fibrillation on Eliquis, his anticoagulation was recently held at the beginning of February due to pathologic thoracic spine fracture, requiring posterior lateral arthrodesis/fusion and corpectomy for tumor resection with Dr. Christella Noa on 2/2. Recently discharged on 2/20 with new DVT left peroneal vein who presents with shortness of breath and hypoxia.    Subjective:  Improving, on 1-2 liter o2 supplement,  Remain  on amiodarone drip, remain in afib, heart rate controlled ,   Continue to have trace pitting edema bilateral lower extremity ,will do ted hose ordered on 3/2, I do not see it on today  1.7liters urine output documented last 24 hours, blood pressure elevated, slightly bump of bun, creatinine stable  No fever , no pain    Assessment & Plan:  Principal Problem:   Acute hypoxic respiratory failure (HCC) Active Problems:   Hypertension   Atrial fibrillation with RVR (HCC)   OSA (obstructive sleep apnea)   Smoldering multiple myeloma   History of DVT (deep vein thrombosis)   CAP (community acquired pneumonia)   Hypokalemia   Hyponatremia   History of thoracic surgery   Pulmonary infiltrates   Acute respiratory failure with hypoxemia (HCC)   Acute on chronic diastolic heart failure (HCC)    Assessment and Plan:   * Acute hypoxic respiratory failure ([place]), not on home O2 at baseline, likely from viral pneumonia and acute on chronic dCHF --CTA chest negative for PE with multifocal bilateral airspace disease consistent with bilateral pneumonia. Small bilateral pleural effusions. -respiratory viral panel +Coronavirus OC43 -no  fever, no Leukocytosis, does not appear septic, urine strep pneumo antigen negative  and procalcitonin  level less than 0.1, abx stopped as less likely bacteria pneumonia --continue steroids and lasix, Follow pulmonology recommendation -Daily weight, follow intake and output -required 10liters at one point prompt stepdown transfer, improving, now on 1-2 liters, transfer out of stepdown, continue to wean O2 as tolerated with goal of >92%  Light headedness and feeling sleepy, Reported on 3/2  orthostatic vital signs ordered on 3/2, not able to locate result, reports symptom resolved on 3/3 Ted hose Out of bed, Start physical therapy,    Steroid induced hyperglycemia No prior diagnosis of diabetes, recent A1c 5.1 Taper steroids, carb modified diet, started on  ssi   Atrial fibrillation with RVR ([place]) - Likely from hypoxia -continue eliquis -rate not well controlled on max dose of cardizem drip, cardiology consulted, started on amiodarone drip, also on bb -meds per cardiology, will follow cardiology recommendation      Hypervolemic hyponatremia Sodium 133 on presentation, sodium normalized   Hypokalemia/hypomagnesemia Supplement prn to keep K>4, mag >2  Hypertension -continue home antihypertensives , adjust prn  History of DVT (deep vein thrombosis) -DX 06/21/22 following discontinuation of anticoagulation for recent spinal surgery -continue eliquis  Recent history of pathologic thoracic spine fracture s/p posterior lateral arthrodesis/fusion and corpectomy on 06/04/22 with neurosurgery  -recommend ambulation and OOB daily   Multiple myeloma with pathologic fracture of T2/cord compression required hospitalization from 1/26 to 2/7  s/p urgent XRT and He underwent  thoracic 1 through 4 posterior lateral arthrodesis/fusion and 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 Lorenso Courier -acyclovir  $'400mg'c$  PO BID for VCZ prophylaxis   Normocytic anemia in the setting of multiple myeloma No overt blood  loss Hgb around 9-10   OSA (obstructive sleep apnea) -continue nightly CPAP       I have Reviewed nursing notes, Vitals, pain scores, I/o's, Lab results and  imaging results since pt's last encounter, details please see discussion above  I ordered the following labs:  Unresulted Labs (From admission, onward)     Start     Ordered   07/04/22 [id number]  Basic metabolic panel  Daily,   R     Question:  Specimen collection method  Answer:  Lab=Lab collect   07/03/22 0752             DVT prophylaxis: Place TED hose Start: 07/03/22 1129 apixaban (ELIQUIS) tablet 5 mg   Code Status:   Code Status: Full Code  Family Communication: wife over the phone on 2/29 Disposition:    Dispo: The patient is from: Home              Anticipated d/c is to: Home              Anticipated d/c date is: transfer out of stepdown, likely home some time next week once off amiodarone drip and  able to wean off oxygen  Antimicrobials:   Anti-infectives (From admission, onward)    Start     Dose/Rate Route Frequency Ordered Stop   07/02/22 1000  doxycycline (VIBRA-TABS) tablet 100 mg  Status:  Discontinued        100 mg Oral Every 12 hours 07/01/22 0951 07/03/22 1052   07/01/22 1000  azithromycin (ZITHROMAX) tablet 500 mg  Status:  Discontinued        500 mg Oral Daily 06/30/22 1234 07/01/22 0951   06/30/22 1800  cefTRIAXone (ROCEPHIN) 1 g in sodium chloride 0.9 % 100 mL IVPB  Status:  Discontinued        1 g 200 mL/hr over 30 Minutes Intravenous Every 24 hours 06/29/22 1933 07/03/22 1052   06/30/22 1000  acyclovir (ZOVIRAX) tablet 400 mg        400 mg Oral 2 times daily 06/30/22 0024     06/30/22 0600  azithromycin (ZITHROMAX) 500 mg in sodium chloride 0.9 % 250 mL IVPB  Status:  Discontinued        500 mg 250 mL/hr over 60 Minutes Intravenous Every 24 hours 06/29/22 1933 06/30/22 1234   06/29/22 1830  cefTRIAXone (ROCEPHIN) 1 g in sodium chloride 0.9 % 100 mL IVPB        1 g 200 mL/hr over 30  Minutes Intravenous  Once 06/29/22 1822 06/29/22 1910   06/29/22 1830  doxycycline (VIBRA-TABS) tablet 100 mg        100 mg Oral  Once 06/29/22 1822 06/29/22 1840           Objective: Vitals:   07/04/22 1100 07/04/22 1153 07/04/22 1200 07/04/22 1544  BP:      Pulse: 79     Resp: 19     Temp:  (!) 97.5 F (36.4 C) (!) 97.5 F (36.4 C) (!) 97.4 F (36.3 C)  TempSrc:  Oral Oral Oral  SpO2: 96%     Weight:      Height:        Intake/Output Summary (Last 24 hours) at 07/04/2022 1611 Last data filed at 07/04/2022 1500 Gross per 24 hour  Intake 499.77 ml  Output 2250 ml  Net -1750.23 ml  Filed Weights   07/02/22 0705 07/03/22 0500 07/04/22 0500  Weight: 108.8 kg 108.9 kg 108.3 kg    Examination:  General exam: AAOx3,  chronically ill-appearing Respiratory system: .  Improved aeration, no wheezing, no rhonchi, respiratory effort normal. Cardiovascular system:  IRRR.  Gastrointestinal system: Abdomen is nondistended, soft and nontender.  Normal bowel sounds heard. Central nervous system: Alert and oriented. No focal neurological deficits. Extremities: Trace bilateral lower extremity pitting edema Skin: No rashes, lesions or ulcers Psychiatry: Judgement and insight appear normal. Mood & affect appropriate.     Data Reviewed: I have personally reviewed  labs and visualized  imaging studies since the last encounter and formulate the plan        Scheduled Meds:  acyclovir  400 mg Oral BID   apixaban  5 mg Oral BID   Chlorhexidine Gluconate Cloth  6 each Topical Daily   guaiFENesin  600 mg Oral BID   hydrALAZINE  25 mg Oral Q8H   insulin aspart  0-15 Units Subcutaneous TID WC   lisinopril  40 mg Oral Daily   metoprolol tartrate  50 mg Oral BID   predniSONE  40 mg Oral Q breakfast   Followed by   Derrill Memo ON 07/09/2022] predniSONE  20 mg Oral Q breakfast   Continuous Infusions:  amiodarone 30 mg/hr (07/04/22 1200)     LOS: 5 days   Time spent:  51mns  FFlorencia Reasons MD PhD FACP [facility]  Available via Epic secure chat 7am-7pm for nonurgent issues Please page for urgent issues To page the attending provider between 7A-7P or the covering provider during after hours 7P-7A, please log into the web site [email or web address] and access using universal [place] password for that web site. If you do not have the password, please call the hospital operator.    07/04/2022, 4:11 PM

## 2022-07-05 ENCOUNTER — Ambulatory Visit: Payer: Medicare Other

## 2022-07-05 DIAGNOSIS — J9601 Acute respiratory failure with hypoxia: Secondary | ICD-10-CM | POA: Diagnosis not present

## 2022-07-05 LAB — BASIC METABOLIC PANEL
Anion gap: 6 (ref 5–15)
BUN: 29 mg/dL — ABNORMAL HIGH (ref 8–23)
CO2: 27 mmol/L (ref 22–32)
Calcium: 7.6 mg/dL — ABNORMAL LOW (ref 8.9–10.3)
Chloride: 103 mmol/L (ref 98–111)
Creatinine, Ser: 0.72 mg/dL (ref 0.61–1.24)
GFR, Estimated: >60 mL/min (ref >60)
Glucose, Bld: 118 mg/dL — ABNORMAL HIGH (ref 70–99)
Potassium: 3.2 mmol/L — ABNORMAL LOW (ref 3.5–5.1)
Sodium: 136 mmol/L (ref 135–145)

## 2022-07-05 LAB — CULTURE, BLOOD (ROUTINE X 2)
Culture: NO GROWTH
Culture: NO GROWTH
Special Requests: ADEQUATE
Special Requests: ADEQUATE

## 2022-07-05 LAB — GLUCOSE, CAPILLARY
Glucose-Capillary: 98 mg/dL (ref 70–99)
Glucose-Capillary: 118 mg/dL — ABNORMAL HIGH (ref 70–99)
Glucose-Capillary: 173 mg/dL — ABNORMAL HIGH (ref 70–99)
Glucose-Capillary: 186 mg/dL — ABNORMAL HIGH (ref 70–99)

## 2022-07-05 MED ORDER — AMIODARONE HCL 200 MG PO TABS
400.0000 mg | ORAL_TABLET | Freq: Two times a day (BID) | ORAL | Status: DC
  Administered 2022-07-05 – 2022-07-06 (×3): 400 mg via ORAL
  Filled 2022-07-05 (×3): qty 2

## 2022-07-05 MED ORDER — AMIODARONE HCL 200 MG PO TABS: 200.0000 mg | ORAL_TABLET | Freq: Every day | ORAL | Status: DC

## 2022-07-05 MED ORDER — POTASSIUM CHLORIDE CRYS ER 20 MEQ PO TBCR
40.0000 meq | EXTENDED_RELEASE_TABLET | Freq: Once | ORAL | Status: AC
  Administered 2022-07-05: 40 meq via ORAL
  Filled 2022-07-05: qty 2

## 2022-07-05 NOTE — Evaluation (Signed)
Occupational Therapy Evaluation Patient Details Name: Travis [name] MRN: [id number] DOB: December 30, 1950 Today's Date: 07/05/2022   History of Present Illness 72 yo male admitted with bilateral PNA.  Hx of with metastatic multiple myeloma on chemotherapy, A fib, Arthritis, DVT, GERD, Hiatal hernia, HTN, Multiple myeloma, OSA, Peripheral neuropathy, Cervical spinal stenosis.  Recent T2 tumor resection 1/26 via Dr. Arneta Cliche.   Clinical Impression   Pt currently min assist for supine to sit with min guard for completion of functional transfers over to the sink with use of the RW for grooming tasks in standing.  Oxygen sats on room air with activity at 92% during standing with HR in the upper 70's.  BP in sitting 143/103.  Pt reports having AE from hip surgery to assist with LB selfcare but has lately relied on his spouse to help assist.  He states he's been feeling "foggy" today with nursing being made aware.  Feel he will benefit from acute care OT at this time in order to progress ADL status to supervision/modified independent so he can return home with his spouse and 24 hr supervision.        Recommendations for follow up therapy are one component of a multi-disciplinary discharge planning process, led by the attending physician.  Recommendations may be updated based on patient status, additional functional criteria and insurance authorization.   Follow Up Recommendations  Home health OT     Assistance Recommended at Discharge PRN  Patient can return home with the following A little help with bathing/dressing/bathroom;Assistance with cooking/housework;Assist for transportation    Functional Status Assessment  Patient has had a recent decline in their functional status and demonstrates the ability to make significant improvements in function in a reasonable and predictable amount of time.  Equipment Recommendations  None recommended by OT       Precautions / Restrictions  Precautions Precautions: Fall;Cervical;Back Precaution Booklet Issued: No Precaution Comments: Pt able to state 3/3 precautions Restrictions Weight Bearing Restrictions: No      Mobility Bed Mobility Overal bed mobility: Needs Assistance Bed Mobility: Rolling, Sidelying to Sit Rolling: Min guard (HOB elevated approximately 35 degrees with use of the rail.) Sidelying to sit: Min assist, HOB elevated       General bed mobility comments: Min assist to bring trunk up to sitting from sidelying on elevated HOB at approximately 30-35 degrees.    Transfers Overall transfer level: Needs assistance Equipment used: Rolling walker (2 wheels) Transfers: Sit to/from Stand Sit to Stand: Min guard     Step pivot transfers: Min guard     General transfer comment: Min instructional cueing to push up with one hand from the surface and not pull up on the RW with both.      Balance Overall balance assessment: Needs assistance Sitting-balance support: No upper extremity supported, Feet supported Sitting balance-Leahy Scale: Fair Sitting balance - Comments: Able to balance EOB without LOB.   Standing balance support: During functional activity, Reliant on assistive device for balance Standing balance-Leahy Scale: Fair Standing balance comment: Able to stand at the sink with grooming tasks without UE support for short periods but then needed UE support for functional mobility.                           ADL either performed or assessed with clinical judgement   ADL Overall ADL's : Needs assistance/impaired Eating/Feeding: Independent;Sitting   Grooming: Wash/dry hands;Oral care;Standing;Minimal assistance Grooming Details (indicate cue type  and reason): standing at the sink         Upper Body Dressing : Minimal assistance;Sitting Upper Body Dressing Details (indicate cue type and reason): simulated Lower Body Dressing: Maximal assistance;Sit to/from stand Lower Body  Dressing Details (indicate cue type and reason): without AE use.  Pt reports he can use AE but recently spouse has been helping more since spinal surgery. Toilet Transfer: Rolling walker (2 wheels);Ambulation;Min guard Armed forces technical officer Details (indicate cue type and reason): Simulated as pt declined need to toilet Toileting- Clothing Manipulation and Hygiene: Min guard;Sit to/from stand Toileting - Clothing Manipulation Details (indicate cue type and reason): simulated     Functional mobility during ADLs: Min guard;Minimal assistance;Rolling walker (2 wheels) General ADL Comments: Pt's BP 143/103 in sitting EOB with O2 sats at 95% on 1L nasal cannula and HR in the mid 70s.  With standing at the sink, he was able to tolerate standing for greater than 7-8 mins without rest.  Oxygen sats decreased to 92% on room air with HR at 77 BPM.  He reports having AE at home from older hip sx.     Vision Baseline Vision/History: 0 No visual deficits Ability to See in Adequate Light: 0 Adequate Patient Visual Report: No change from baseline Vision Assessment?: No apparent visual deficits     Perception  Not tested   Praxis  Not tested    Pertinent Vitals/Pain Pain Assessment Pain Assessment: Faces Pain Score: 2  Faces Pain Scale: Hurts a little bit Pain Location: neck Pain Descriptors / Indicators: Discomfort, Sore Pain Intervention(s): Limited activity within patient's tolerance, Repositioned     Hand Dominance Right   Extremity/Trunk Assessment Upper Extremity Assessment Upper Extremity Assessment: RUE deficits/detail RUE Deficits / Details: Right shoulder flexion 0-80 degrees with history of humeral ORIF.  Decreased internal and external rotation also noted grossly but not formally measured.   All other joints AROM WFLs, generalized weakness. RUE Sensation: WNL RUE Coordination: WNL LUE Deficits / Details: Shoulder flexion 0-110 degrees with all other joints AROM WFLS. LUE Coordination:  WNL       Cervical / Trunk Assessment Cervical / Trunk Assessment: Kyphotic;Other exceptions Cervical / Trunk Exceptions: Flexed and forward head noted with decreased ability to extend to neutral position.   Communication Communication Communication: No difficulties   Cognition Arousal/Alertness: Awake/alert Behavior During Therapy: WFL for tasks assessed/performed Overall Cognitive Status: Within Functional Limits for tasks assessed                                 General Comments: Pt reports being "foggy" but likely more related to lack of sleep.  Cognitively appropriate with no difficulties noted.                Home Living Family/patient expects to be discharged to:: Private residence Living Arrangements: Spouse/significant other Available Help at Discharge: Family;Available 24 hours/day Type of Home: House Home Access: Stairs to enter [employer] of Steps: 4 Entrance Stairs-Rails: Right Home Layout: Two level;Able to live on main level with bedroom/bathroom Alternate Level Stairs-Number of Steps: flight   Bathroom Shower/Tub: Walk-in shower (stands with the RW in the shower per his report)   Bathroom Toilet: Handicapped height Bathroom Accessibility: Yes   Home Equipment: Conservation officer, nature (2 wheels);Cane - single point;Shower seat - built in;Adaptive equipment;Hand held shower head;Other (comment) (adjustable bed) Adaptive Equipment: Reacher;Sock aid;Long-handled shoe horn;Long-handled sponge Additional Comments: Risk analyst  Prior Functioning/Environment Prior Level of Function : Working/employed;Driving;Needs assist             Mobility Comments: pt reports using RW since surgery for limited distances around the home ADLs Comments: since surgery spouse assisting at needed with socks/shoes. Pt also has adapative equipment but agrees spouse helping is just easier.        OT Problem List: Decreased strength;Decreased range of  motion;Impaired balance (sitting and/or standing);Obesity;Pain;Impaired sensation      OT Treatment/Interventions: Self-care/ADL training;DME and/or AE instruction;Patient/family education;Balance training;Therapeutic exercise;Therapeutic activities    OT Goals(Current goals can be found in the care plan section) Acute Rehab OT Goals Patient Stated Goal: Pt wants to get back to doing the things he used to do like golf. OT Goal Formulation: With patient Time For Goal Achievement: 07/19/22 Potential to Achieve Goals: Good  OT Frequency: Min 2X/week    Co-evaluation PT/OT/SLP Co-Evaluation/Treatment: Yes Reason for Co-Treatment: Complexity of the patient's impairments (multi-system involvement);For patient/therapist safety;To address functional/ADL transfers   OT goals addressed during session: ADL's and self-care      AM-PAC OT "6 Clicks" Daily Activity     Outcome Measure Help from another person eating meals?: None Help from another person taking care of personal grooming?: None Help from another person toileting, which includes using toliet, bedpan, or urinal?: A Little Help from another person bathing (including washing, rinsing, drying)?: A Little Help from another person to put on and taking off regular upper body clothing?: A Little Help from another person to put on and taking off regular lower body clothing?: A Lot 6 Click Score: 19   End of Session Equipment Utilized During Treatment: Rolling walker (2 wheels) Nurse Communication: Mobility status  Activity Tolerance: Patient tolerated treatment well Patient left: in chair;with call bell/phone within reach;Other (comment) (with PT)  OT Visit Diagnosis: Unsteadiness on feet (R26.81);Other abnormalities of gait and mobility (R26.89);Muscle weakness (generalized) (M62.81);Pain Pain - part of body:  (upper back)                Time: 1437-1510 OT Time Calculation (min): 33 min Charges:  OT General Charges $OT Visit: 1  Visit OT Evaluation $OT Eval Moderate Complexity: 1 Mod Marcayla Budge OTR/L 07/05/2022, 3:28 PM

## 2022-07-05 NOTE — Progress Notes (Signed)
Mobility Specialist - Progress Note  Pre-mobility: 90 bpm HR, 95% SpO2 During mobility: 106 bpm HR, 90% SpO2 Post-mobility: 89 bpm HR, 95% SPO2   07/05/22 1022  Oxygen Therapy  O2 Device Nasal Cannula  O2 Flow Rate (L/min) 1 L/min  Patient Activity (if Appropriate) Ambulating  Mobility  Activity Ambulated with assistance in hallway  Level of Assistance Standby assist, set-up cues, supervision of patient - no hands on  Assistive Device Front wheel walker  Distance Ambulated (ft) 100 ft  Range of Motion/Exercises Active  Activity Response Tolerated well  Mobility Referral Yes  $Mobility charge 1 Mobility   Pt was found on recliner chair on 1L O2 and agreeable to ambulate. Stated feeling foggy due to not getting good sleep last night. Still persistent on ambulating despite feeling foggy. Once standing able to take a couple steps and stated he wanted to keep going. During hallway ambulation stated being fearful of falling and unsure of how he'll do at home. At EOS returned to recliner chair and stated that ambulating was fine for him but he just felt lightheaded. Was left with all necessities in reach and chair alarm on.   Ferd Hibbs Mobility Specialist

## 2022-07-05 NOTE — Evaluation (Signed)
Physical Therapy Evaluation Patient Details Name: Travis [name] MRN: [id number] DOB: 1950-11-02 Today's Date: 07/05/2022  History of Present Illness  72 yo male admitted with bilateral PNA.  Hx of with metastatic multiple myeloma on chemotherapy, A fib, Arthritis, DVT, GERD, Hiatal hernia, HTN, Multiple myeloma, OSA, Peripheral neuropathy, Cervical spinal stenosis.  Recent T2 tumor resection, PLIF T1-T4 05/28/22  Clinical Impression  On eval, pt was Min guard-Min A for mobility. He walked ~120 feet with a RW. O2 89% on RA with ambulation, 92% on RA at rest. Pt tolerated activity well. Dyspnea 2/4 with activity. He expresses some concerns about returning home-worried about burden on wife. Recommend resume HHPT after discharge. Will continue to follow and progress activity as pt is able to tolerate.        Recommendations for follow up therapy are one component of a multi-disciplinary discharge planning process, led by the attending physician.  Recommendations may be updated based on patient status, additional functional criteria and insurance authorization.  Follow Up Recommendations Home health PT      Assistance Recommended at Discharge Frequent or constant Supervision/Assistance  Patient can return home with the following  A little help with walking and/or transfers;A little help with bathing/dressing/bathroom;Assistance with cooking/housework;Assist for transportation;Help with stairs or ramp for entrance    Equipment Recommendations None recommended by PT  Recommendations for Other Services       Functional Status Assessment Patient has had a recent decline in their functional status and demonstrates the ability to make significant improvements in function in a reasonable and predictable amount of time.     Precautions / Restrictions Precautions Precautions: Fall;Cervical;Back Precaution Booklet Issued: No Precaution Comments: Pt able to state 3/3  precautions Restrictions Weight Bearing Restrictions: No      Mobility  Bed Mobility Overal bed mobility: Needs Assistance Bed Mobility: Rolling, Sidelying to Sit Rolling: Min guard Sidelying to sit: Min assist, HOB elevated       General bed mobility comments: Min assist to bring trunk up to sitting from sidelying on elevated HOB at approximately 30-35 degrees.    Transfers Overall transfer level: Needs assistance Equipment used: Rolling walker (2 wheels) Transfers: Sit to/from Stand Sit to Stand: Min guard, From elevated surface           General transfer comment: Min instructional cueing to push up with one hand from the surface and not pull up on the RW with both.    Ambulation/Gait Ambulation/Gait assistance: Min guard Gait Distance (Feet): 120 Feet Assistive device: Rolling walker (2 wheels) Gait Pattern/deviations: Step-through pattern, Decreased stride length       General Gait Details: Min guard for safety. No LOB with RW use. O2 89% on RA-quickly rebounded to 90% with seated rest.Dyspnea 2/4  Stairs            Wheelchair Mobility    Modified Rankin (Stroke Patients Only)       Balance Overall balance assessment: Needs assistance         Standing balance support: During functional activity, Reliant on assistive device for balance, Bilateral upper extremity supported Standing balance-Leahy Scale: Fair                               Pertinent Vitals/Pain Pain Assessment Pain Assessment: Faces Pain Score: 2  Faces Pain Scale: Hurts a little bit Pain Location: neck Pain Descriptors / Indicators: Discomfort, Sore Pain Intervention(s): Limited activity within patient's tolerance,  Repositioned    Home Living Family/patient expects to be discharged to:: Private residence Living Arrangements: Spouse/significant other Available Help at Discharge: Family;Available 24 hours/day Type of Home: House Home Access: Stairs to  enter Entrance Stairs-Rails: Right Entrance Stairs-Number of Steps: 4 Alternate Level Stairs-Number of Steps: flight Home Layout: Two level;Able to live on main level with bedroom/bathroom Home Equipment: Rolling Walker (2 wheels);Cane - single point;Shower seat - built in;Adaptive equipment;Hand held shower head;Other (comment) (adjustable bed) Additional Comments: Lift recliner    Prior Function Prior Level of Function : Working/employed;Driving;Needs assist             Mobility Comments: pt reports using RW since surgery for limited distances around the home ADLs Comments: since surgery spouse assisting at needed with socks/shoes. Pt also has adapative equipment but agrees spouse helping is just easier.     Hand Dominance   Dominant Hand: Right    Extremity/Trunk Assessment   Upper Extremity Assessment Upper Extremity Assessment: Defer to OT evaluation RUE Deficits / Details: Right shoulder flexion 0-80 degrees with history of humeral ORIF.  Decreased internal and external rotation also noted grossly but not formally measured.   All other joints AROM WFLs, generalized weakness. RUE Sensation: WNL RUE Coordination: WNL LUE Deficits / Details: Shoulder flexion 0-110 degrees with all other joints AROM WFLS. LUE Coordination: WNL    Lower Extremity Assessment Lower Extremity Assessment: Generalized weakness RLE Sensation: history of peripheral neuropathy LLE Sensation: history of peripheral neuropathy    Cervical / Trunk Assessment Cervical / Trunk Assessment: Kyphotic;Other exceptions Cervical / Trunk Exceptions: Flexed and forward head noted with decreased ability to extend to neutral position.  Communication   Communication: No difficulties  Cognition Arousal/Alertness: Awake/alert Behavior During Therapy: WFL for tasks assessed/performed Overall Cognitive Status: Within Functional Limits for tasks assessed                                 General  Comments: Pt reports being "foggy" but possibly more related to lack of sleep.  Cognitively appropriate with no difficulties noted.        General Comments      Exercises     Assessment/Plan    PT Assessment Patient needs continued PT services  PT Problem List Decreased strength;Decreased activity tolerance;Decreased balance;Decreased mobility;Decreased knowledge of use of DME;Decreased safety awareness;Decreased knowledge of precautions;Pain;Impaired sensation       PT Treatment Interventions DME instruction;Gait training;Stair training;Functional mobility training;Therapeutic activities;Therapeutic exercise;Balance training;Neuromuscular re-education;Patient/family education    PT Goals (Current goals can be found in the Care Plan section)  Acute Rehab PT Goals Patient Stated Goal: regain PLOF/independence PT Goal Formulation: With patient Time For Goal Achievement: 07/19/22 Potential to Achieve Goals: Good    Frequency Min 3X/week     Co-evaluation   Reason for Co-Treatment: Complexity of the patient's impairments (multi-system involvement);For patient/therapist safety;To address functional/ADL transfers   OT goals addressed during session: ADL's and self-care       AM-PAC PT "6 Clicks" Mobility  Outcome Measure Help needed turning from your back to your side while in a flat bed without using bedrails?: A Little Help needed moving from lying on your back to sitting on the side of a flat bed without using bedrails?: A Little Help needed moving to and from a bed to a chair (including a wheelchair)?: A Little Help needed standing up from a chair using your arms (e.g., wheelchair or bedside chair)?:  A Little Help needed to walk in hospital room?: A Little Help needed climbing 3-5 steps with a railing? : A Little 6 Click Score: 18    End of Session Equipment Utilized During Treatment: Gait belt Activity Tolerance: Patient tolerated treatment well Patient left: in  chair;with call bell/phone within reach;with chair alarm set   PT Visit Diagnosis: Muscle weakness (generalized) (M62.81);Difficulty in walking, not elsewhere classified (R26.2);Pain    Time: [id number] PT Time Calculation (min) (ACUTE ONLY): 33 min   Charges:   PT Evaluation $PT Eval Low Complexity: [place], PT Acute Rehabilitation  Office: [phone]

## 2022-07-05 NOTE — Care Management Important Message (Signed)
Important Message  Patient Details IM Letter given. Name: STAS BLACKLEDGE MRN: [id number] Date of Birth: 10/09/1950   Medicare Important Message Given:  Yes     Kerin Salen 07/05/2022, 1:26 PM

## 2022-07-05 NOTE — Hospital Course (Addendum)
70yom w/ OSA on CPAP, multiple myeloma currently on oral chemotherapy and radiation therapy, who has known atrial fibrillation on Eliquis, his anticoagulation was recently held at the beginning of February due to pathologic thoracic spine fracture, requiring posterior lateral arthrodesis/fusion and corpectomy for tumor resection with Dr. Christella Noa on 2/2. Recently discharged on 2/20 with new DVT left peroneal vein presented with shortness of breath and hypoxia.  Patient admitted for acute hypoxic respiratory failure, CTA negative for PE, multifocal pneumonia noted placed on antibiotics coronavirus OC 43 positive> procalcitonin less than 0.1 subsequently antibiotic discontinued.

## 2022-07-05 NOTE — Progress Notes (Signed)
Progress Note  Patient Name: Travis [name] Date of Encounter: 07/05/2022  Primary Cardiologist: [name], MD   Subjective   Patient seen and examined at his bedside. He offers no complaints at this time.  Inpatient Medications    Scheduled Meds:  acyclovir  400 mg Oral BID   apixaban  5 mg Oral BID   guaiFENesin  600 mg Oral BID   hydrALAZINE  25 mg Oral Q8H   insulin aspart  0-15 Units Subcutaneous TID WC   lisinopril  40 mg Oral Daily   metoprolol tartrate  50 mg Oral BID   predniSONE  40 mg Oral Q breakfast   Followed by   Derrill Memo ON 07/09/2022] predniSONE  20 mg Oral Q breakfast   Continuous Infusions:  amiodarone 30 mg/hr (07/05/22 0559)   PRN Meds: acetaminophen, docusate sodium, fluticasone, hydrALAZINE, mouth rinse, sodium chloride   Vital Signs    Vitals:   07/05/22 0119 07/05/22 0517 07/05/22 0908 07/05/22 0929  BP: (!) 141/87 138/88 135/85 115/72  Pulse: 84 88  88  Resp: '20 20  20  '$ Temp: 97.8 F (36.6 C) 97.6 F (36.4 C)  97.7 F (36.5 C)  TempSrc: Oral Oral  Oral  SpO2: 95% 95%  97%  Weight:  112.8 kg    Height:        Intake/Output Summary (Last 24 hours) at 07/05/2022 1046 Last data filed at 07/05/2022 0835 Gross per 24 hour  Intake 514.6 ml  Output 2350 ml  Net -1835.4 ml   Filed Weights   07/03/22 0500 07/04/22 0500 07/05/22 0517  Weight: 108.9 kg 108.3 kg 112.8 kg    Telemetry    Afib rate controlled - Personally Reviewed  ECG    None today - Personally Reviewed  Physical Exam    General: Comfortable Head: Atraumatic, normal size  Eyes: PEERLA, EOMI  Neck: Supple, normal JVD Cardiac: Normal S1, S2; irregularly irregular; no murmurs, rubs, or gallops Lungs: Clear to auscultation bilaterally Abd: Soft, nontender, no hepatomegaly  Ext: warm, no edema Musculoskeletal: No deformities, BUE and BLE strength normal and equal Skin: Warm and dry, no rashes   Neuro: Alert and oriented to person, place, time, and situation, CNII-XII  grossly intact, no focal deficits  Psych: Normal mood and affect   Labs    Chemistry Recent Labs  Lab 06/29/22 1512 06/30/22 0402 07/03/22 0257 07/04/22 0321 07/05/22 0444  NA 133*   < > 137 136 136  K 3.2*   < > 4.3 3.6 3.2*  CL 102   < > 102 101 103  CO2 24   < > '24 23 27  '$ GLUCOSE 174*   < > 221* 185* 118*  BUN 17   < > 31* 37* 29*  CREATININE 0.79   < > 0.79 0.83 0.72  CALCIUM 8.0*   < > 7.9* 7.7* 7.6*  PROT 5.2*  --   --   --   --   ALBUMIN 2.4*  --   --   --   --   AST 19  --   --   --   --   ALT 24  --   --   --   --   ALKPHOS 78  --   --   --   --   BILITOT 0.7  --   --   --   --   GFRNONAA >60   < > >60 >60 >60  ANIONGAP 7   < >  $'11 12 6   'W$ < > = values in this interval not displayed.     Hematology Recent Labs  Lab 06/29/22 1512 06/30/22 0402 07/03/22 0257  WBC 9.0 7.6 11.7*  RBC 3.04* 3.04* 3.05*  HGB 9.7* 10.0* 9.8*  HCT 29.8* 29.7* 29.8*  MCV 98.0 97.7 97.7  MCH 31.9 32.9 32.1  MCHC 32.6 33.7 32.9  RDW 16.3* 16.4* 16.2*  PLT 231 211 237    Cardiac EnzymesNo results for input(s): "TROPONINI" in the last 168 hours. No results for input(s): "TROPIPOC" in the last 168 hours.   BNP Recent Labs  Lab 06/29/22 1512 07/04/22 0321  BNP 785.8* 293.9*     DDimer No results for input(s): "DDIMER" in the last 168 hours.   Radiology    No results found.  Cardiac Studies   Echo   06/22/22  1. Appears to be in sinus rhythm at the time of evaluation- A waves  present on diastology. Left ventricular ejection fraction, by estimation,  is 60 to 65%. The left ventricle has normal function. The left ventricle  has no regional wall motion  abnormalities. There is mild concentric left ventricular hypertrophy. Left  ventricular diastolic parameters are consistent with Grade II diastolic  dysfunction (pseudonormalization).   2. Right ventricular systolic function is normal. The right ventricular  size is normal. There is normal pulmonary artery systolic  pressure.   3. The mitral valve is grossly normal. No evidence of mitral valve  regurgitation.   4. The aortic valve is tricuspid. Aortic valve regurgitation is not  visualized. No aortic stenosis is present.   5. The inferior vena cava is normal in size with greater than 50% respiratory variability, suggesting right atrial pressure of 3 mmHg.   Patient Profile     72 y.o. male with a hx of HTN, HLD, OSA on CPAP, multiple myeloma on chemo/radiation therapy, persistent atrial fibrillation s/p ablation 2018, and recurrent DVT .  Assessment & Plan    Paroxysmal atrial fibrillation-in A-fib today however rate controlled.  Would recommend transitioning to p.o. amiodarone 400 mg twice daily for the next 5 days then 400 mg daily.  Continue the metoprolol titrate 50 mg twice daily. Continue Eliquis.  Acute on chronic diastolic heart failure-he is responding well to the diuretics.  Total output for the last 24 hours 1835 mL.  Continue IV Lasix for now.  Acute epoxy respiratory failure-clinically appears to be improving this is likely multifactorial in the setting of his heart failure as well as coronavirus pneumonia.  DVT-on Eliquis.  Hypertensive heart disease-blood pressure is at target continue current hypertensive regimen.       For questions or updates, please contact [place] Please consult [email or web address] for contact info under Cardiology/STEMI.      Signed, Berniece Salines, DO  07/05/2022, 10:46 AM

## 2022-07-05 NOTE — Progress Notes (Signed)
PROGRESS NOTE Travis [name]  [name] DOB: 1951-03-04 DOA: 06/29/2022 PCP: Travis Frames, MD   Brief Narrative/Hospital Course: 72yom w/ OSA on CPAP, multiple myeloma currently on oral chemotherapy and radiation therapy, who has known atrial fibrillation on Eliquis, his anticoagulation was recently held at the beginning of February due to pathologic thoracic spine fracture, requiring posterior lateral arthrodesis/fusion and corpectomy for tumor resection with Travis [name] on 2/2. Recently discharged on 2/20 with new DVT left peroneal vein presented with shortness of breath and hypoxia.  Patient admitted for acute hypoxic respiratory failure, CTA negative for PE, multifocal pneumonia noted placed on antibiotics coronavirus OC 43 positive> procalcitonin less than 0.1 subsequently antibiotic discontinued.     Subjective: Seen and examined Overnight afebrile,* k 3.2 On CPAP overnight He is on bedside chair denies any complaint Overall feeling better Assessment and Plan:  Acute hypoxic respiratory failure Acute on chronic diastolic heart failure: Respiratory failure likely from viral pneumonia and acute on chronic dCHF. not on home O2 at baseline. S/p CTA chest negative for PE with multifocal bilateral airspace disease consistent with bilateral pneumonia. Small bilateral pleural effusions. Respiratory viral panel +Coronavirus OC43.  CBC no fever no leukocytosis, sepsis ruled out,urine strep pneumo antigen negative  and procalcitonin level less than 0.1, antibiotic discontinued.  Continue steroids, Lasix, pulmonary hygiene.  Monitor intake output Daily weight and diuretic management as per cardiology.  Noted weight gain as below> keeping on I will lasix Net IO Since Admission: -6,106.97 mL [07/05/22 1148]  Filed Weights   07/03/22 0500 07/04/22 0500 07/05/22 0517  Weight: 108.9 kg 108.3 kg 112.8 kg   Lightheadedness: Feeling sleepy report on 3/2.  Hemodynamically stable symptoms have  resolved 3/3 cont ptot  Steroid-induced hyperglycemia A1c normal 5.1 tapering steroids continue carb modified diet and continue SSI Recent Labs  Lab 07/04/22 1128 07/04/22 1527 07/04/22 1656 07/04/22 2039 07/05/22 0728  GLUCAP 210* 187* 154* 188* 98    Paroxysmal A-fib with RVR in the setting of hypoxia: Rate control on amiodarone being transitioned to p.o. for the next 5 days 400 twice daily then 400 daily continue metoprolol and Eliquis per cardiology  Hypervolemic hyponatremia: Improved. Hypokalemia/hypomagnesemia: Potassium is still low being replaced Recent Labs  Lab 07/01/22 0348 07/02/22 0346 07/03/22 0257 07/04/22 0321 07/05/22 0444  K 3.6 3.7 4.3 3.6 3.2*  CALCIUM 7.7* 7.9* 7.9* 7.7* 7.6*  MG 1.7  --  2.1 2.0  --   PHOS 2.7  --  3.8  --   --     Hypertension BP stable continue current hydralazine, lisinopril metoprolol  History of DVT (deep vein thrombosis):-DX 06/21/22 following discontinuation of anticoagulation for recent spinal surgerycontinue eliquis   Recent history of pathologic thoracic spine fracture s/p posterior lateral arthrodesis/fusion and corpectomy on 06/04/22 with neurosurgery >recommend ambulation and OOB daily   Multiple myeloma with pathologic fracture of T2/cord compression required hospitalization from 1/26 to 2/7  s/p urgent XRT and He underwent  thoracic 1 through 4 posterior lateral arthrodesis/fusion and 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   -Cycle 1, Day 1 of Velcade/Pom/Dex 06/23/22 -Follows with oncologist Travis [name] -acyclovir '400mg'$  PO BID for VCZ prophylaxis    Chronic anemia normocytic in the setting of myeloma, monitor, stable Recent Labs  Lab 06/29/22 1512 06/30/22 0402 07/03/22 0257  HGB 9.7* 10.0* 9.8*  HCT 29.8* 29.7* 29.8*    OSA continue nightly CPAP  Class I Obesity:Patient's Body mass index is 35.68 kg/m.Will benefit with PCP follow-up,  weight loss  healthy lifestyle  DVT prophylaxis: Place TED  hose Start: 07/03/22 1129 Code Status:   Code Status: Full Code Family Communication: plan of care discussed with patient at bedside. Patient status is: Inpatient because of A-fib respiratory failure heart failure Level of care: Progressive   Dispo: The patient is from: home            Anticipated disposition: TBD-PT OT consultED for disposition Objective: Vitals last 24 hrs: Vitals:   07/05/22 0119 07/05/22 0517 07/05/22 0908 07/05/22 0929  BP: (!) 141/87 138/88 135/85 115/72  Pulse: 84 88  88  Resp: '20 20  20  '$ Temp: 97.8 F (36.6 C) 97.6 F (36.4 C)  97.7 F (36.5 C)  TempSrc: Oral Oral  Oral  SpO2: 95% 95%  97%  Weight:  112.8 kg    Height:       Weight change: 4.5 kg  Physical Examination: General exam: alert awake, older than stated age HEENT:Oral mucosa moist, Ear/Nose WNL grossly Respiratory system: bilaterally DIMINISHED BS, no use of accessory muscle Cardiovascular system: S1 & S2 +, No JVD. Gastrointestinal system: Abdomen soft,NT,ND, BS+ Nervous System:Alert, awake, moving extremities. Extremities: LE edema ++,distal peripheral pulses palpable.  Skin: No rashes,no icterus. MSK: Normal muscle bulk,tone, power  Medications reviewed:  Scheduled Meds:  acyclovir  400 mg Oral BID   apixaban  5 mg Oral BID   guaiFENesin  600 mg Oral BID   hydrALAZINE  25 mg Oral Q8H   insulin aspart  0-15 Units Subcutaneous TID WC   lisinopril  40 mg Oral Daily   metoprolol tartrate  50 mg Oral BID   predniSONE  40 mg Oral Q breakfast   Followed by   Derrill Memo ON 07/09/2022] predniSONE  20 mg Oral Q breakfast   Continuous Infusions:  amiodarone 30 mg/hr (07/05/22 0559)    Diet Order             Diet Carb Modified Fluid consistency: Thin  Diet effective now                  Intake/Output Summary (Last 24 hours) at 07/05/2022 0937 Last data filed at 07/05/2022 0835 Gross per 24 hour  Intake 514.6 ml  Output 2350 ml  Net -1835.4 ml   Net IO Since Admission: -6,106.97 mL  [07/05/22 0937]  Wt Readings from Last 3 Encounters:  07/05/22 112.8 kg  06/28/22 109.6 kg  06/23/22 111.1 kg     Unresulted Labs (From admission, onward)    None     BP stable data Reviewed: I have personally reviewed following labs and imaging studies CBC: Recent Labs  Lab 06/29/22 1512 06/30/22 0402 07/03/22 0257  WBC 9.0 7.6 11.7*  HGB 9.7* 10.0* 9.8*  HCT 29.8* 29.7* 29.8*  MCV 98.0 97.7 97.7  PLT 231 211 [id number]   Basic Metabolic Panel: Recent Labs  Lab 07/01/22 0348 07/02/22 0346 07/03/22 0257 07/04/22 0321 07/05/22 0444  NA 135 133* 137 136 136  K 3.6 3.7 4.3 3.6 3.2*  CL 104 104 102 101 103  CO2 24 21* '24 23 27  '$ GLUCOSE 138* 197* 221* 185* 118*  BUN 17 20 31* 37* 29*  CREATININE 0.72 0.76 0.79 0.83 0.72  CALCIUM 7.7* 7.9* 7.9* 7.7* 7.6*  MG 1.7  --  2.1 2.0  --   PHOS 2.7  --  3.8  --   --    GFR: Estimated Creatinine Clearance: 106.5 mL/min (by C-G formula based on SCr of  0.72 mg/dL). Liver Function Tests: Recent Labs  Lab 06/29/22 1512  AST 19  ALT 24  ALKPHOS 78  BILITOT 0.7  PROT 5.2*  ALBUMIN 2.4*    Recent Labs    11/17/21 1513  PROBNP 232   HbA1C: No results for input(s): "HGBA1C" in the last 72 hours. CBG: Recent Labs  Lab 07/04/22 1128 07/04/22 1527 07/04/22 1656 07/04/22 2039 07/05/22 0728  GLUCAP 210* 187* 154* 188* 98   Recent Labs  Lab 07/01/22 0348 07/02/22 0346  PROCALCITON <0.10 <0.10    Recent Results (from the past 240 hour(s))  Resp panel by RT-PCR (RSV, Flu A&B, Covid) Anterior Nasal Swab     Status: None   Collection Time: 06/29/22  3:00 PM   Specimen: Anterior Nasal Swab  Result Value Ref Range Status   SARS Coronavirus 2 by RT PCR NEGATIVE NEGATIVE Final    Comment: (NOTE) SARS-CoV-2 target nucleic acids are NOT DETECTED.  The SARS-CoV-2 RNA is generally detectable in upper respiratory specimens during the acute phase of infection. The lowest concentration of SARS-CoV-2 viral copies this assay can  detect is 138 copies/mL. A negative result does not preclude SARS-Cov-2 infection and should not be used as the sole basis for treatment or other patient management decisions. A negative result may occur with  improper specimen collection/handling, submission of specimen other than nasopharyngeal swab, presence of viral mutation(s) within the areas targeted by this assay, and inadequate number of viral copies(<138 copies/mL). A negative result must be combined with clinical observations, patient history, and epidemiological information. The expected result is Negative.  Fact Sheet for Patients:  EntrepreneurPulse.com.au  Fact Sheet for Healthcare Providers:  IncredibleEmployment.be  This test is no t yet approved or cleared by the [place] FDA and  has been authorized for detection and/or diagnosis of SARS-CoV-2 by FDA under an Emergency Use Authorization (EUA). This EUA will remain  in effect (meaning this test can be used) for the duration of the COVID-19 declaration under Section 564(b)(1) of the Act, 21 U.S.C.section 360bbb-3(b)(1), unless the authorization is terminated  or revoked sooner.       Influenza A by PCR NEGATIVE NEGATIVE Final   Influenza B by PCR NEGATIVE NEGATIVE Final    Comment: (NOTE) The Xpert Xpress SARS-CoV-2/FLU/RSV plus assay is intended as an aid in the diagnosis of influenza from Nasopharyngeal swab specimens and should not be used as a sole basis for treatment. Nasal washings and aspirates are unacceptable for Xpert Xpress SARS-CoV-2/FLU/RSV testing.  Fact Sheet for Patients: EntrepreneurPulse.com.au  Fact Sheet for Healthcare Providers: IncredibleEmployment.be  This test is not yet approved or cleared by the [place] FDA and has been authorized for detection and/or diagnosis of SARS-CoV-2 by FDA under an Emergency Use Authorization (EUA). This EUA will remain in  effect (meaning this test can be used) for the duration of the COVID-19 declaration under Section 564(b)(1) of the Act, 21 U.S.C. section 360bbb-3(b)(1), unless the authorization is terminated or revoked.     Resp Syncytial Virus by PCR NEGATIVE NEGATIVE Final    Comment: (NOTE) Fact Sheet for Patients: EntrepreneurPulse.com.au  Fact Sheet for Healthcare Providers: IncredibleEmployment.be  This test is not yet approved or cleared by the [place] FDA and has been authorized for detection and/or diagnosis of SARS-CoV-2 by FDA under an Emergency Use Authorization (EUA). This EUA will remain in effect (meaning this test can be used) for the duration of the COVID-19 declaration under Section 564(b)(1) of the Act, 21 U.S.C. section 360bbb-3(b)(1),  unless the authorization is terminated or revoked.  Performed at [facility], [place] [address] [place], [place] [address]   Culture, blood (Routine X 2) w Reflex to ID Panel     Status: None   Collection Time: 06/30/22 12:40 PM   Specimen: BLOOD LEFT ARM  Result Value Ref Range Status   Specimen Description   Final    BLOOD LEFT ARM Performed at [place] [address], [place], [place] [address]    Special Requests   Final    AEROBIC BOTTLE ONLY Blood Culture adequate volume Performed at [place] [address], [place], [place] [address]    Culture   Final    NO GROWTH 5 DAYS Performed at [facility], [place] [address], [place], [place] [address]    Report Status 07/05/2022 FINAL  Final  Culture, blood (Routine X 2) w Reflex to ID Panel     Status: None   Collection Time: 06/30/22 12:40 PM   Specimen: BLOOD RIGHT HAND  Result Value Ref Range Status   Specimen Description   Final    BLOOD RIGHT HAND Performed at [place] [address], [place], [place] [address]    Special Requests   Final     AEROBIC BOTTLE ONLY Blood Culture adequate volume Performed at [place] [address] [place], [place] [address]    Culture   Final    NO GROWTH 5 DAYS Performed at [facility], [place] [address] [place] [address], [place], [place] [address]    Report Status 07/05/2022 FINAL  Final  MRSA Next Gen by PCR, Nasal     Status: None   Collection Time: 07/01/22 11:50 AM   Specimen: Nasal Mucosa; Nasal Swab  Result Value Ref Range Status   MRSA by PCR Next Gen NOT DETECTED NOT DETECTED Final    Comment: (NOTE) The GeneXpert MRSA Assay (FDA approved for NASAL specimens only), is one component of a comprehensive MRSA colonization surveillance program. It is not intended to diagnose MRSA infection nor to guide or monitor treatment for MRSA infections. Test performance is not FDA approved in patients less than 73 years old. Performed at [facility], [place] [address] [place], [place] [address]   Respiratory (~20 pathogens) panel by PCR     Status: Abnormal   Collection Time: 07/01/22  7:13 PM   Specimen: Nasopharyngeal Swab; Respiratory  Result Value Ref Range Status   Adenovirus NOT DETECTED NOT DETECTED Final   Coronavirus 229E NOT DETECTED NOT DETECTED Final    Comment: (NOTE) The Coronavirus on the Respiratory Panel, DOES NOT test for the novel  Coronavirus (2019 nCoV)    Coronavirus HKU1 NOT DETECTED NOT DETECTED Final   Coronavirus NL63 NOT DETECTED NOT DETECTED Final   Coronavirus OC43 DETECTED (A) NOT DETECTED Final   Metapneumovirus NOT DETECTED NOT DETECTED Final   Rhinovirus / Enterovirus NOT DETECTED NOT DETECTED Final   Influenza A NOT DETECTED NOT DETECTED Final   Influenza B NOT DETECTED NOT DETECTED Final   Parainfluenza Virus 1 NOT DETECTED NOT DETECTED Final   Parainfluenza Virus 2 NOT DETECTED NOT DETECTED Final   Parainfluenza Virus 3 NOT DETECTED NOT DETECTED Final   Parainfluenza Virus 4 NOT DETECTED NOT DETECTED Final   Respiratory  Syncytial Virus NOT DETECTED NOT DETECTED Final   Bordetella pertussis NOT DETECTED NOT DETECTED Final   Bordetella Parapertussis NOT DETECTED NOT DETECTED Final  Chlamydophila pneumoniae NOT DETECTED NOT DETECTED Final   Mycoplasma pneumoniae NOT DETECTED NOT DETECTED Final    Comment: Performed at [facility], [place] [address] [place], [place] [address]    Antimicrobials: Anti-infectives (From admission, onward)    Start     Dose/Rate Route Frequency Ordered Stop   07/02/22 1000  doxycycline (VIBRA-TABS) tablet 100 mg  Status:  Discontinued        100 mg Oral Every 12 hours 07/01/22 0951 07/03/22 1052   07/01/22 1000  azithromycin (ZITHROMAX) tablet 500 mg  Status:  Discontinued        500 mg Oral Daily 06/30/22 1234 07/01/22 0951   06/30/22 1800  cefTRIAXone (ROCEPHIN) 1 g in sodium chloride 0.9 % 100 mL IVPB  Status:  Discontinued        1 g 200 mL/hr over 30 Minutes Intravenous Every 24 hours 06/29/22 1933 07/03/22 1052   06/30/22 1000  acyclovir (ZOVIRAX) tablet 400 mg        400 mg Oral 2 times daily 06/30/22 0024     06/30/22 0600  azithromycin (ZITHROMAX) 500 mg in sodium chloride 0.9 % 250 mL IVPB  Status:  Discontinued        500 mg 250 mL/hr over 60 Minutes Intravenous Every 24 hours 06/29/22 1933 06/30/22 1234   06/29/22 1830  cefTRIAXone (ROCEPHIN) 1 g in sodium chloride 0.9 % 100 mL IVPB        1 g 200 mL/hr over 30 Minutes Intravenous  Once 06/29/22 1822 06/29/22 1910   06/29/22 1830  doxycycline (VIBRA-TABS) tablet 100 mg        100 mg Oral  Once 06/29/22 1822 06/29/22 1840      Culture/Microbiology    Component Value Date/Time   SDES  06/30/2022 1240    BLOOD LEFT ARM Performed at [facility], [place] [address] [place], [place] [address]    SDES  06/30/2022 1240    BLOOD RIGHT HAND Performed at [facility], [place] [address], [place], [place] [address]    SPECREQUEST  06/30/2022 1240    AEROBIC BOTTLE ONLY Blood  Culture adequate volume Performed at [place] [address] [place], [place] [address]    SPECREQUEST  06/30/2022 1240    AEROBIC BOTTLE ONLY Blood Culture adequate volume Performed at [place] [address], [place], [place] [address]    CULT  06/30/2022 1240    NO GROWTH 5 DAYS Performed at [place] [address] [place], [place] [address]    CULT  06/30/2022 1240    NO GROWTH 5 DAYS Performed at [facility], [place] [address] [place], [place] [address]    REPTSTATUS 07/05/2022 FINAL 06/30/2022 1240   REPTSTATUS 07/05/2022 FINAL 06/30/2022 1240    Radiology Studies: No results found.   LOS: 6 days   Antonieta Pert, MD [facility]  07/05/2022, 9:37 AM

## 2022-07-06 ENCOUNTER — Ambulatory Visit: Payer: Medicare Other

## 2022-07-06 ENCOUNTER — Other Ambulatory Visit (HOSPITAL_COMMUNITY): Payer: Self-pay

## 2022-07-06 LAB — BASIC METABOLIC PANEL
Anion gap: 7 (ref 5–15)
BUN: 21 mg/dL (ref 8–23)
CO2: 26 mmol/L (ref 22–32)
Calcium: 7.7 mg/dL — ABNORMAL LOW (ref 8.9–10.3)
Chloride: 102 mmol/L (ref 98–111)
Creatinine, Ser: 0.76 mg/dL (ref 0.61–1.24)
GFR, Estimated: >60 mL/min (ref >60)
Glucose, Bld: 103 mg/dL — ABNORMAL HIGH (ref 70–99)
Potassium: 3.2 mmol/L — ABNORMAL LOW (ref 3.5–5.1)
Sodium: 135 mmol/L (ref 135–145)

## 2022-07-06 LAB — GLUCOSE, CAPILLARY
Glucose-Capillary: 108 mg/dL — ABNORMAL HIGH (ref 70–99)
Glucose-Capillary: 124 mg/dL — ABNORMAL HIGH (ref 70–99)

## 2022-07-06 LAB — POTASSIUM: Potassium: 4.3 mmol/L (ref 3.5–5.1)

## 2022-07-06 MED ORDER — AMIODARONE HCL 200 MG PO TABS: 200.0000 mg | ORAL_TABLET | Freq: Every day | ORAL | 1 refills | Status: DC

## 2022-07-06 MED ORDER — HYDRALAZINE HCL 25 MG PO TABS
25.0000 mg | ORAL_TABLET | Freq: Three times a day (TID) | ORAL | 0 refills | Status: DC
  Filled 2022-07-06: qty 90, 30d supply, fill #0

## 2022-07-06 MED ORDER — AMIODARONE HCL 400 MG PO TABS
400.0000 mg | ORAL_TABLET | Freq: Two times a day (BID) | ORAL | 0 refills | Status: DC
  Filled 2022-07-06 (×2): qty 8, 4d supply, fill #0

## 2022-07-06 MED ORDER — PREDNISONE 20 MG PO TABS
20.0000 mg | ORAL_TABLET | Freq: Every day | ORAL | 0 refills | Status: AC
  Filled 2022-07-06: qty 5, 5d supply, fill #0

## 2022-07-06 MED ORDER — METOPROLOL TARTRATE 50 MG PO TABS
50.0000 mg | ORAL_TABLET | Freq: Two times a day (BID) | ORAL | 0 refills | Status: DC
  Filled 2022-07-06: qty 60, 30d supply, fill #0

## 2022-07-06 MED ORDER — POTASSIUM CHLORIDE CRYS ER 20 MEQ PO TBCR
40.0000 meq | EXTENDED_RELEASE_TABLET | Freq: Once | ORAL | Status: AC
  Administered 2022-07-06: 40 meq via ORAL
  Filled 2022-07-06: qty 2

## 2022-07-06 MED ORDER — POTASSIUM CHLORIDE 10 MEQ/100ML IV SOLN
10.0000 meq | INTRAVENOUS | Status: AC
  Administered 2022-07-06 (×2): 10 meq via INTRAVENOUS
  Filled 2022-07-06: qty 100

## 2022-07-06 NOTE — TOC Progression Note (Signed)
Transition of Care [facility]) - Progression Note    Patient Details  Name: Travis [name] MRN: [id number] Date of Birth: 25-Aug-1950  Transition of Care [facility]) CM/SW Contact  Lorina Duffner, Juliann Pulse, RN Phone Number: 07/06/2022, 10:23 AM  Clinical Narrative: Already active [facility] HHPT rep Amy following-await HHPT face to face order.      Expected Discharge Plan: [place] Barriers to Discharge: Continued Medical Work up  Expected Discharge Plan and Services   Discharge Planning Services: CM Consult Post Acute Care Choice: Resumption of Svcs/PTA Provider, Durable Medical Equipment, Home Health (cane, walker, HHPT/OT w/ [facility]; would like to resume services at discharge) Living arrangements for the past 2 months: Apartment                                       Social Determinants of Health (SDOH) Interventions SDOH Screenings   Food Insecurity: No Food Insecurity (06/30/2022)  Housing: Low Risk  (06/30/2022)  Transportation Needs: No Transportation Needs (06/30/2022)  Utilities: Not At Risk (07/02/2022)  Depression (PHQ2-9): Low Risk  (10/16/2020)  Tobacco Use: Low Risk  (06/29/2022)    Readmission Risk Interventions    06/09/2022   11:52 AM  Readmission Risk Prevention Plan  Transportation Screening Complete  PCP or Specialist Appt within 5-7 Days Complete  Home Care Screening Complete  Medication Review (RN CM) Complete

## 2022-07-06 NOTE — Discharge Summary (Signed)
Physician Discharge Summary  Travis [name] [name] DOB: 03/05/1951 DOA: 06/29/2022  PCP: [name], MD  Admit date: 06/29/2022 Discharge date: 07/06/2022 Recommendations for Outpatient Follow-up:  Follow up with PCP in 1 weeks-call for appointment Please obtain BMP/CBC in one week  Discharge Dispo: Home w/ [facility] Discharge Condition: Stable Code Status:   Code Status: Full Code Diet recommendation:  Diet Order             Diet Carb Modified Fluid consistency: Thin  Diet effective now                    Brief/Interim Summary: 72yom w/ OSA on CPAP, multiple myeloma currently on oral chemotherapy and radiation therapy, who has known atrial fibrillation on Eliquis, his anticoagulation was recently held at the beginning of February due to pathologic thoracic spine fracture, requiring posterior lateral arthrodesis/fusion and corpectomy for tumor resection with Dr. Christella Noa on 2/2. Recently discharged on 2/20 with new DVT left peroneal vein presented with shortness of breath and hypoxia.  Patient admitted for acute hypoxic respiratory failure with acute on chronic diastolic CHF> CTA negative for PE, multifocal pneumonia noted placed on antibiotics coronavirus OC 43 positive> procalcitonin less than 0.1 subsequently antibiotic discontinued.  Patient had persistent A-fib needing amiodarone transitioned to PO by cardiology and also treated with diuretics for acute on chronic diastolic CHF.  Will continue PRN Lasix at home follow-up outpatient. Will check his ambulatory pulse ox prior to discharge to see if he needs to go home on oxygen otherwise PT OT has cleared for home health PT. he was weaned off oxygen doing well and ambulated.  Feels ready for discharge home today   Discharge Diagnoses:  Principal Problem:   Acute hypoxic respiratory failure ([place]) Active Problems:   Hypertension   Atrial fibrillation with RVR (HCC)   OSA (obstructive sleep apnea)   Smoldering multiple  myeloma   History of DVT (deep vein thrombosis)   CAP (community acquired pneumonia)   Hypokalemia   Hyponatremia   History of thoracic surgery   Pulmonary infiltrates   Acute respiratory failure with hypoxemia (HCC)   Acute on chronic diastolic heart failure (HCC)  Acute hypoxic respiratory failure Acute on chronic diastolic heart failure Suspected viral pneumonia: Respiratory failure likely from viral pneumonia and acute on chronic dCHF. not on home O2 at baseline. S/p CTA chest negative for PE with multifocal bilateral airspace disease consistent with bilateral pneumonia. Small bilateral pleural effusions. Respiratory viral panel +Coronavirus OC43.  CBC no fever no leukocytosis, sepsis ruled out,urine strep pneumo antigen negative  and procalcitonin level less than 0.1, antibiotic discontinued.  Improved, currently doing well on room air, continue steroid taper prn Lasix, pulmonary hygiene.Net IO Since Admission: -7,381.97 mL [07/06/22 1452]  Filed Weights   07/04/22 0500 07/05/22 0517 07/06/22 0500  Weight: 108.3 kg 112.8 kg 108 kg   Lightheadedness: Feeling sleepy report on 3/2.  Hemodynamically stable symptoms have resolved 3/3 cont ptot  Steroid-induced hyperglycemia A1c normal 5.1 stable   Paroxysmal A-fib with RVR in the setting of hypoxia: Rate controlled on amiodarone being transitioned to p.o. for the next 5 days 400 twice daily then 200 daily continue metoprolol and Eliquis per cardiology.  Hypervolemic hyponatremia: Improved. Hypokalemia/hypomagnesemia: Potassium replaced.  Recheck prior to discharge- needs close follow-up in 1 week to recheck potassium level, per cardiology Recent Labs  Lab 07/01/22 0348 07/02/22 0346 07/03/22 0257 07/04/22 0321 07/05/22 0444 07/06/22 0620 07/06/22 1333  K 3.6 3.7 4.3 3.6  3.2* 3.2* 4.3  CALCIUM 7.7* 7.9* 7.9* 7.7* 7.6* 7.7*  --   MG 1.7  --  2.1 2.0  --   --   --   PHOS 2.7  --  3.8  --   --   --   --     Hypertension BP stable  continue current hydralazine, lisinopril metoprolol  History of DVT (deep vein thrombosis):-DX 06/21/22 following discontinuation of anticoagulation for recent spinal surgery- continue eliquis   Recent history of pathologic thoracic spine fracture s/p posterior lateral arthrodesis/fusion and corpectomy on 06/04/22 with neurosurgery >recommend ambulation and OOB daily   Multiple myeloma with pathologic fracture of T2/cord compression required hospitalization from 1/26 to 2/7  s/p urgent XRT and He underwent  thoracic 1 through 4 posterior lateral arthrodesis/fusion and 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 Lorenso Courier -acyclovir '400mg'$  PO BID for VCZ prophylaxis    Chronic anemia normocytic in the setting of myeloma, monitor, stable Recent Labs  Lab 06/29/22 1512 06/30/22 0402 07/03/22 0257  HGB 9.7* 10.0* 9.8*  HCT 29.8* 29.7* 29.8*    OSA continue nightly CPAP  Class I Obesity:Patient's Body mass index is 34.16 kg/m.Will benefit with PCP follow-up, weight loss  healthy lifestyle  Consults: none Subjective: Doing well weaned to RA,ambulated, he feels ready for discharge home today  Discharge Exam: Vitals:   07/06/22 0500 07/06/22 1243  BP: (!) 153/95 122/85  Pulse: 72 74  Resp: 18   Temp: 97.8 F (36.6 C) 98.8 F (37.1 C)  SpO2: 95% 94%   General: Pt is alert, awake, not in acute distress Cardiovascular: RRR, S1/S2 +, no rubs, no gallops Respiratory: CTA bilaterally, no wheezing, no rhonchi Abdominal: Soft, NT, ND, bowel sounds + Extremities: no edema, no cyanosis  Discharge Instructions  Discharge Instructions     (HEART FAILURE PATIENTS) Call MD:  Anytime you have any of the following symptoms: 1) 3 pound weight gain in 24 hours or 5 pounds in 1 week 2) shortness of breath, with or without a dry hacking cough 3) swelling in the hands, feet or stomach 4) if you have to sleep on  extra pillows at night in order to breathe.   Complete by: As directed    Discharge instructions   Complete by: As directed    Please call call MD or return to ER for similar or worsening recurring problem that brought you to hospital or if any fever,nausea/vomiting,abdominal pain, uncontrolled pain, chest pain,  shortness of breath or any other alarming symptoms.  Please follow-up your doctor as instructed in a week time and call the office for appointment.  Please avoid alcohol, smoking, or any other illicit substance and maintain healthy habits including taking your regular medications as prescribed.  You were cared for by a hospitalist during your hospital stay. If you have any questions about your discharge medications or the care you received while you were in the hospital after you are discharged, you can call the unit and ask to speak with the hospitalist on call if the hospitalist that took care of you is not available.  Once you are discharged, your primary care physician will handle any further medical issues. Please note that NO REFILLS for any discharge medications will be authorized once you are discharged, as it is imperative that you return to your primary care physician (or establish a relationship with a primary care physician if you  do not have one) for your aftercare needs so that they can reassess your need for medications and monitor your lab values   Increase activity slowly   Complete by: As directed       Allergies as of 07/06/2022       Reactions   Linezolid Other (See Comments)   Burning tongue    Vancomycin Rash        Medication List     STOP taking these medications    amLODipine 10 MG tablet Commonly known as: NORVASC   diltiazem 240 MG 24 hr capsule Commonly known as: CARDIZEM CD       TAKE these medications    acetaminophen 325 MG tablet Commonly known as: Tylenol Take 2 tablets (650 mg total) by mouth every 6 (six) hours as needed. What  changed: reasons to take this   acyclovir 400 MG tablet Commonly known as: ZOVIRAX Take 1 tablet (400 mg total) by mouth 2 (two) times daily.   amiodarone 400 MG tablet Commonly known as: PACERONE Take 1 tablet (400 mg total) by mouth 2 (two) times daily for 4 days. After that take 200 mg daily home dose What changed: You were already taking a medication with the same name, and this prescription was added. Make sure you understand how and when to take each.   amiodarone 200 MG tablet Commonly known as: PACERONE Take 1 tablet (200 mg total) by mouth daily. Start after finishing 40 mg twice daily dose of  days Start taking on: July 11, 2022 What changed:  additional instructions These instructions start on July 11, 2022. If you are unsure what to do until then, ask your doctor or other care provider.   apixaban 5 MG Tabs tablet Commonly known as: ELIQUIS Take 1 tablet (5 mg total) by mouth 2 (two) times daily. What changed: Another medication with the same name was removed. Continue taking this medication, and follow the directions you see here.   cholecalciferol 25 MCG (1000 UNIT) tablet Commonly known as: VITAMIN D3 Take 1,000 Units by mouth daily.   dexamethasone 4 MG tablet Commonly known as: DECADRON Take 4 mg by mouth See admin instructions. Take 4 mg by mouth once a day for 3 days after chemo on Wednesday- or unless otherwise instructed   FISH OIL PO Take 1 Capful by mouth daily.   fluticasone 50 MCG/ACT nasal spray Commonly known as: FLONASE Place 1 spray into both nostrils daily as needed for allergies or rhinitis.   folic acid 1 MG tablet Commonly known as: FOLVITE Take 1 mg by mouth daily.   furosemide 40 MG tablet Commonly known as: LASIX Take 40 mg by mouth daily as needed for edema.   gabapentin 300 MG capsule Commonly known as: NEURONTIN TAKE TWO CAPSULES BY MOUTH TWICE A DAY   hydrALAZINE 25 MG tablet Commonly known as: APRESOLINE Take 1 tablet (25  mg total) by mouth every 8 (eight) hours. What changed:  how much to take when to take this   HYDROcodone-acetaminophen 7.5-325 MG tablet Commonly known as: NORCO Take 1 tablet by mouth every 6 (six) hours as needed for moderate pain.   lisinopril 40 MG tablet Commonly known as: ZESTRIL TAKE ONE TABLET BY MOUTH ONE TIME DAILY   methocarbamol 750 MG tablet Commonly known as: ROBAXIN Take 750 mg by mouth every 8 (eight) hours as needed for muscle spasms.   metoprolol tartrate 50 MG tablet Commonly known as: LOPRESSOR Take 1 tablet (50 mg total)  by mouth 2 (two) times daily.   Pomalyst 4 MG capsule Generic drug: pomalidomide Take 4 mg by mouth See admin instructions. Take 4 mg by mouth once a day for 21 days "on," then 7 days "off." Repeat every 28 days.   potassium chloride SA 20 MEQ tablet Commonly known as: KLOR-CON M Take 2 tablets (40 mEq total) by mouth daily.   predniSONE 20 MG tablet Commonly known as: DELTASONE Take 1 tablet (20 mg total) by mouth daily with breakfast for 5 days. Start taking on: July 09, 2022   sodium chloride 0.65 % Soln nasal spray Commonly known as: OCEAN Place 1 spray into both nostrils as needed for congestion.   traMADol 50 MG tablet Commonly known as: ULTRAM Take 1 tablet (50 mg total) by mouth every 6 (six) hours as needed. What changed: reasons to take this   VITAMIN B-12 PO Take 1,000 mcg by mouth daily.        Follow-up Information     [name], MD Follow up in 1 week(s).   Specialty: Internal Medicine Contact information: [address], [address] [place] [address] [phone]         Fay Records, MD Follow up in 1 week(s).   Specialty: Cardiology Contact information: [address] [place] [address] [phone]                Allergies  Allergen Reactions   Linezolid Other (See Comments)    Burning tongue    Vancomycin Rash    The results of  significant diagnostics from this hospitalization (including imaging, microbiology, ancillary and laboratory) are listed below for reference.    Microbiology: Recent Results (from the past 240 hour(s))  Resp panel by RT-PCR (RSV, Flu A&B, Covid) Anterior Nasal Swab     Status: None   Collection Time: 06/29/22  3:00 PM   Specimen: Anterior Nasal Swab  Result Value Ref Range Status   SARS Coronavirus 2 by RT PCR NEGATIVE NEGATIVE Final    Comment: (NOTE) SARS-CoV-2 target nucleic acids are NOT DETECTED.  The SARS-CoV-2 RNA is generally detectable in upper respiratory specimens during the acute phase of infection. The lowest concentration of SARS-CoV-2 viral copies this assay can detect is 138 copies/mL. A negative result does not preclude SARS-Cov-2 infection and should not be used as the sole basis for treatment or other patient management decisions. A negative result may occur with  improper specimen collection/handling, submission of specimen other than nasopharyngeal swab, presence of viral mutation(s) within the areas targeted by this assay, and inadequate number of viral copies(<138 copies/mL). A negative result must be combined with clinical observations, patient history, and epidemiological information. The expected result is Negative.  Fact Sheet for Patients:  EntrepreneurPulse.com.au  Fact Sheet for Healthcare Providers:  IncredibleEmployment.be  This test is no t yet approved or cleared by the [place] FDA and  has been authorized for detection and/or diagnosis of SARS-CoV-2 by FDA under an Emergency Use Authorization (EUA). This EUA will remain  in effect (meaning this test can be used) for the duration of the COVID-19 declaration under Section 564(b)(1) of the Act, 21 U.S.C.section 360bbb-3(b)(1), unless the authorization is terminated  or revoked sooner.       Influenza A by PCR NEGATIVE NEGATIVE Final   Influenza B by PCR  NEGATIVE NEGATIVE Final    Comment: (NOTE) The Xpert Xpress SARS-CoV-2/FLU/RSV plus assay is intended as an aid in the diagnosis of  influenza from Nasopharyngeal swab specimens and should not be used as a sole basis for treatment. Nasal washings and aspirates are unacceptable for Xpert Xpress SARS-CoV-2/FLU/RSV testing.  Fact Sheet for Patients: EntrepreneurPulse.com.au  Fact Sheet for Healthcare Providers: IncredibleEmployment.be  This test is not yet approved or cleared by the [place] FDA and has been authorized for detection and/or diagnosis of SARS-CoV-2 by FDA under an Emergency Use Authorization (EUA). This EUA will remain in effect (meaning this test can be used) for the duration of the COVID-19 declaration under Section 564(b)(1) of the Act, 21 U.S.C. section 360bbb-3(b)(1), unless the authorization is terminated or revoked.     Resp Syncytial Virus by PCR NEGATIVE NEGATIVE Final    Comment: (NOTE) Fact Sheet for Patients: EntrepreneurPulse.com.au  Fact Sheet for Healthcare Providers: IncredibleEmployment.be  This test is not yet approved or cleared by the [place] FDA and has been authorized for detection and/or diagnosis of SARS-CoV-2 by FDA under an Emergency Use Authorization (EUA). This EUA will remain in effect (meaning this test can be used) for the duration of the COVID-19 declaration under Section 564(b)(1) of the Act, 21 U.S.C. section 360bbb-3(b)(1), unless the authorization is terminated or revoked.  Performed at [facility], [place] [address], [place], [place] [address]   Culture, blood (Routine X 2) w Reflex to ID Panel     Status: None   Collection Time: 06/30/22 12:40 PM   Specimen: BLOOD LEFT ARM  Result Value Ref Range Status   Specimen Description   Final    BLOOD LEFT ARM Performed at [place] [address]  [place], [place] [address]    Special Requests   Final    AEROBIC BOTTLE ONLY Blood Culture adequate volume Performed at [place] [address] [place], [place] [address]    Culture   Final    NO GROWTH 5 DAYS Performed at [facility], [place] [address] [place], [place] [address]    Report Status 07/05/2022 FINAL  Final  Culture, blood (Routine X 2) w Reflex to ID Panel     Status: None   Collection Time: 06/30/22 12:40 PM   Specimen: BLOOD RIGHT HAND  Result Value Ref Range Status   Specimen Description   Final    BLOOD RIGHT HAND Performed at [place] [address] [place], [place] [address]    Special Requests   Final    AEROBIC BOTTLE ONLY Blood Culture adequate volume Performed at [place] [address], [place], [place] [address]    Culture   Final    NO GROWTH 5 DAYS Performed at [facility], [place] [address] [place], [place] [address]    Report Status 07/05/2022 FINAL  Final  MRSA Next Gen by PCR, Nasal     Status: None   Collection Time: 07/01/22 11:50 AM   Specimen: Nasal Mucosa; Nasal Swab  Result Value Ref Range Status   MRSA by PCR Next Gen NOT DETECTED NOT DETECTED Final    Comment: (NOTE) The GeneXpert MRSA Assay (FDA approved for NASAL specimens only), is one component of a comprehensive MRSA colonization surveillance program. It is not intended to diagnose MRSA infection nor to guide or monitor treatment for MRSA infections. Test performance is not FDA approved in patients less than 16 years old. Performed at [facility], [place] [address], [place],  [address]   Respiratory (~20 pathogens) panel by PCR  Status: Abnormal   Collection Time: 07/01/22  7:13 PM   Specimen: Nasopharyngeal Swab; Respiratory  Result Value Ref Range Status   Adenovirus NOT DETECTED NOT DETECTED Final   Coronavirus 229E NOT DETECTED NOT DETECTED Final    Comment:  (NOTE) The Coronavirus on the Respiratory Panel, DOES NOT test for the novel  Coronavirus (2019 nCoV)    Coronavirus HKU1 NOT DETECTED NOT DETECTED Final   Coronavirus NL63 NOT DETECTED NOT DETECTED Final   Coronavirus OC43 DETECTED (A) NOT DETECTED Final   Metapneumovirus NOT DETECTED NOT DETECTED Final   Rhinovirus / Enterovirus NOT DETECTED NOT DETECTED Final   Influenza A NOT DETECTED NOT DETECTED Final   Influenza B NOT DETECTED NOT DETECTED Final   Parainfluenza Virus 1 NOT DETECTED NOT DETECTED Final   Parainfluenza Virus 2 NOT DETECTED NOT DETECTED Final   Parainfluenza Virus 3 NOT DETECTED NOT DETECTED Final   Parainfluenza Virus 4 NOT DETECTED NOT DETECTED Final   Respiratory Syncytial Virus NOT DETECTED NOT DETECTED Final   Bordetella pertussis NOT DETECTED NOT DETECTED Final   Bordetella Parapertussis NOT DETECTED NOT DETECTED Final   Chlamydophila pneumoniae NOT DETECTED NOT DETECTED Final   Mycoplasma pneumoniae NOT DETECTED NOT DETECTED Final    Comment: Performed at [facility], [place]. [address] [place], [place] [address]    Procedures/Studies: DG CHEST PORT 1 VIEW  Result Date: 07/01/2022 CLINICAL DATA:  Hypoxia EXAM: PORTABLE CHEST 1 VIEW COMPARISON:  Portable exam 1054 hours compared to 06/29/2022 FINDINGS: Normal heart size mediastinal contours. Extensive BILATERAL patchy airspace infiltrates consistent with multifocal pneumonia, little changed. No pleural effusion or pneumothorax. Bones demineralized with evidence of prior cervicothoracic fusion and proximal RIGHT humeral nailing. IMPRESSION: Patchy BILATERAL pulmonary infiltrates consistent with multifocal pneumonia, little changed. Electronically Signed   By: Lavonia Dana M.D.   On: 07/01/2022 11:57   CT T-SPINE NO CHARGE  Result Date: 06/29/2022 CLINICAL DATA:  Hypoxia, dyspnea, multiple myeloma EXAM: CT THORACIC SPINE WITHOUT CONTRAST TECHNIQUE: Multidetector CT images of the thoracic were obtained using the  standard protocol without intravenous contrast. RADIATION DOSE REDUCTION: This exam was performed according to the departmental dose-optimization program which includes automated exposure control, adjustment of the mA and/or kV according to patient size and/or use of iterative reconstruction technique. COMPARISON:  06/03/2022, 05/29/2022 FINDINGS: Alignment: Exaggerated kyphosis centered at the T2 level related to prior corpectomy, spacer placement, and T1-T4 posterior fusion. Otherwise alignment is anatomic. Vertebrae: There are stable lytic lesions within the T8 and T10 vertebral bodies, T11 spinous process, and left L1 and L2 pedicles compatible with known multiple myeloma. Expansile lytic lesions within the right posterior sixth and eighth ribs are stable. There are no acute displaced fractures. Interval T2 corpectomy with spacer placement, as well as posterior fusion spanning T1-T4. Orthopedic hardware is unremarkable. Paraspinal and other soft tissues: Widespread bilateral airspace disease and small bilateral pleural effusions are noted. Mediastinal and hilar lymphadenopathy. Paraspinal soft tissues are otherwise unremarkable. Disc levels: There is no significant bony encroachment upon the central canal or neural foramina. Disc spaces are relatively well preserved. IMPRESSION: 1. Stable lytic lesions within the thoracolumbar spine and thoracic cage, consistent with known multiple myeloma. 2. Interval postsurgical changes from T2 corpectomy, spacer placement, and T1-T4 posterior fusion. 3. No acute fractures. 4. Please see separately reported CT chest exam, describing multifocal bilateral pneumonia. Electronically Signed   By: Randa Ngo M.D.   On: 06/29/2022 16:51   CT Angio Chest PE W/Cm &/Or Wo Cm  Result Date: 06/29/2022 CLINICAL DATA:  Dyspnea, hypoxia, recently diagnosed DVT, history of multiple myeloma EXAM: CT ANGIOGRAPHY CHEST WITH CONTRAST TECHNIQUE: Multidetector CT imaging of the chest was  performed using the standard protocol during bolus administration of intravenous contrast. Multiplanar CT image reconstructions and MIPs were obtained to evaluate the vascular anatomy. RADIATION DOSE REDUCTION: This exam was performed according to the departmental dose-optimization program which includes automated exposure control, adjustment of the mA and/or kV according to patient size and/or use of iterative reconstruction technique. CONTRAST:  42m OMNIPAQUE IOHEXOL 350 MG/ML SOLN COMPARISON:  03/15/2022, 06/03/2022 FINDINGS: Cardiovascular: This is a technically adequate evaluation of the pulmonary vasculature. No filling defects or pulmonary emboli. Mild cardiomegaly without pericardial effusion. No evidence of thoracic aortic aneurysm or dissection. Mediastinum/Nodes: Mediastinal and hilar adenopathy identified. Index lymph node in the subcarinal region measures 16 mm. Thyroid, trachea, and esophagus are unremarkable. Lungs/Pleura: The widespread bilateral airspace disease seen on earlier chest x-ray is again noted, most pronounced within the right upper lobe. There are small bilateral pleural effusions, right greater than left. No pneumothorax. The central airways are patent. Upper Abdomen: No acute abnormality. Musculoskeletal: Lytic lesions within the right posterior sixth rib, T11 spinous process, and left L1 pedicle are stable, consistent with known multiple myeloma. Interval corpectomy and spacer placement at the T2 level, with posterior fusion spanning T1 through T4. Reconstructed images demonstrate no additional findings. Review of the MIP images confirms the above findings. IMPRESSION: 1. No evidence of pulmonary embolus. 2. Multifocal bilateral airspace disease consistent with bilateral pneumonia. 3. Small bilateral pleural effusions. 4. Mediastinal and hilar adenopathy, likely reactive. 5. Stable lytic lesions within the spine and thoracic cage consistent with known multiple myeloma. 6. Interval  postsurgical changes at the T2 level from prior corpectomy and spacer placement. Electronically Signed   By: MRanda NgoM.D.   On: 06/29/2022 16:47   DG Chest 2 View  Result Date: 06/29/2022 CLINICAL DATA:  Shortness of breath beginning this morning. EXAM: CHEST - 2 VIEW COMPARISON:  Chest radiographs 06/21/2022 in 03/10/2022 FINDINGS: Cardiac silhouette and mediastinal contours are partially obscured by overlying lung opacities are grossly unremarkable. There are dense heterogeneous airspace opacities which in the right mid lung and left mid and lower lung that are new from 06/21/2022 most recent comparison. Minimal posterior costophrenic angle blunting on lateral view, tiny pleural effusion at least on the left side and possibly the right side. No pneumothorax is seen. Moderate multilevel degenerative disc changes of the thoracic spine. Lower cervical and upper thoracic partially visualized rod and screw fusion hardware there is again noted. IMPRESSION: Dense heterogeneous airspace opacities which in the right mid lung and left mid and lower lung that are new from 06/21/2022 most recent comparison. Findings are suspicious for multifocal pneumonia. Electronically Signed   By: RYvonne KendallM.D.   On: 06/29/2022 14:59   ECHOCARDIOGRAM COMPLETE  Result Date: 06/22/2022    ECHOCARDIOGRAM REPORT   [facility] Medical Rec #:  [id number]       Height:       70.0 in Accession #:    [id number]      Weight:       244.3 lb Date of Birth:  1August 24, 1952      BSA:          2.272 m Patient Age:    89years         BP:  132/74 mmHg Patient Gender: M                HR:           65 bpm. Exam Location:  Inpatient Procedure: 2D Echo, Color Doppler, Cardiac Doppler and Intracardiac            Opacification Agent Indications:    I48.91* Unspeicified atrial fibrillation  History:        Patient has prior history of Echocardiogram examinations, most                  recent 09/01/2021. Abnormal ECG, Arrythmias:Atrial Fibrillation,                 Signs/Symptoms:Chest Pain and Edema; Risk Factors:Hypertension                 and Sleep Apnea. Chemotherapy. Myeloma.  Sonographer:    Roseanna Rainbow RDCS Referring Phys: [id number] MIR M [facility]  Sonographer Comments: Technically difficult study due to poor echo windows and patient is obese. Image acquisition challenging due to patient body habitus. Patient in fowler's position. Patient post spinal surgery 2/2. IMPRESSIONS  1. Appears to be in sinus rhythm at the time of evaluation- A waves present on diastology. Left ventricular ejection fraction, by estimation, is 60 to 65%. The left ventricle has normal function. The left ventricle has no regional wall motion abnormalities. There is mild concentric left ventricular hypertrophy. Left ventricular diastolic parameters are consistent with Grade II diastolic dysfunction (pseudonormalization).  2. Right ventricular systolic function is normal. The right ventricular size is normal. There is normal pulmonary artery systolic pressure.  3. The mitral valve is grossly normal. No evidence of mitral valve regurgitation.  4. The aortic valve is tricuspid. Aortic valve regurgitation is not visualized. No aortic stenosis is present.  5. The inferior vena cava is normal in size with greater than 50% respiratory variability, suggesting right atrial pressure of 3 mmHg. FINDINGS  Left Ventricle: Appears to be in sinus rhythm at the time of evaluation- A waves present on diastology. Left ventricular ejection fraction, by estimation, is 60 to 65%. The left ventricle has normal function. The left ventricle has no regional wall motion abnormalities. Definity contrast agent was given IV to delineate the left ventricular endocardial borders. The left ventricular internal cavity size was normal in size. There is mild concentric left ventricular hypertrophy. Left ventricular diastolic parameters are consistent with  Grade II diastolic dysfunction (pseudonormalization). Right Ventricle: The right ventricular size is normal. No increase in right ventricular wall thickness. Right ventricular systolic function is normal. There is normal pulmonary artery systolic pressure. The tricuspid regurgitant velocity is 2.33 m/s, and  with an assumed right atrial pressure of 3 mmHg, the estimated right ventricular systolic pressure is [id number] mmHg. Left Atrium: Left atrial size was normal in size. Right Atrium: Right atrial size was normal in size. Pericardium: There is no evidence of pericardial effusion. Mitral Valve: The mitral valve is grossly normal. No evidence of mitral valve regurgitation. Tricuspid Valve: The tricuspid valve is normal in structure. Tricuspid valve regurgitation is not demonstrated. No evidence of tricuspid stenosis. Aortic Valve: The aortic valve is tricuspid. Aortic valve regurgitation is not visualized. No aortic stenosis is present. Pulmonic Valve: The pulmonic valve was grossly normal. Pulmonic valve regurgitation is not visualized. Aorta: The aortic root and ascending aorta are structurally normal, with no evidence of dilitation. Venous: The inferior vena cava is normal in size with greater than 50% respiratory variability,  suggesting right atrial pressure of 3 mmHg. IAS/Shunts: No atrial level shunt detected by color flow Doppler.  LEFT VENTRICLE PLAX 2D LVIDd:         5.20 cm   Diastology LVIDs:         3.30 cm   LV e' medial:    6.53 cm/s LV PW:         1.20 cm   LV E/e' medial:  15.5 LV IVS:        1.20 cm   LV e' lateral:   11.60 cm/s LVOT diam:     2.60 cm   LV E/e' lateral: 8.7 LV SV:         133 LV SV Index:   58 LVOT Area:     5.31 cm  RIGHT VENTRICLE             IVC RV S prime:     18.80 cm/s  IVC diam: 2.60 cm TAPSE (M-mode): 3.0 cm LEFT ATRIUM             Index        RIGHT ATRIUM           Index LA diam:        4.30 cm 1.89 cm/m   RA Area:     20.90 cm LA Vol (A2C):   84.9 ml 37.36 ml/m  RA Volume:    60.10 ml  26.45 ml/m LA Vol (A4C):   66.4 ml 29.22 ml/m LA Biplane Vol: 74.9 ml 32.96 ml/m  AORTIC VALVE LVOT Vmax:   127.00 cm/s LVOT Vmean:  84.000 cm/s LVOT VTI:    0.250 m  AORTA Ao Root diam: 3.70 cm Ao Asc diam:  3.70 cm MITRAL VALVE                TRICUSPID VALVE MV Area (PHT): 4.21 cm     TR Peak grad:   21.7 mmHg MV Decel Time: 180 msec     TR Vmax:        233.00 cm/s MV E velocity: 101.00 cm/s MV A velocity: 54.00 cm/s   SHUNTS MV E/A ratio:  1.87         Systemic VTI:  0.25 m                             Systemic Diam: 2.60 cm Rudean Haskell MD Electronically signed by Rudean Haskell MD Signature Date/Time: 06/22/2022/9:37:19 AM    Final    DG Thoracic Spine 2 View  Result Date: 06/21/2022 CLINICAL DATA:  Trauma EXAM: THORACIC SPINE 2 VIEWS COMPARISON:  Cone beam CT 06/04/22 FINDINGS: Assessment is limited due to patient positioning. Post surgical changes from fusion at the cervicothoracic junction. Hardware is intact with unchanged alignment compared to immediate post-operative cone-beam CT. There is no evidence of thoracic spine fracture. No other significant bone abnormalities are identified. IMPRESSION: Limited assessment due to patient positioning. Within this limitation - no evidence of thoracic spine fracture. If there is high clinical concern fro a cervical or thoracic spine injury, further evaluation with CT is recommended. Electronically Signed   By: Marin Roberts M.D.   On: 06/21/2022 10:55   VAS [place] LOWER EXTREMITY VENOUS (DVT) (7a-7p)  Result Date: 06/21/2022  Lower Venous DVT Study Patient Name:  IVORY THORMAN [facility]  Date of Exam:   06/21/2022 Medical Rec #: [id number]         Accession #:  [id number] Date of Birth: December 22, 1950        Patient Gender: M Patient Age:   43 years Exam Location:  [facility] Procedure:      VAS [place] LOWER EXTREMITY VENOUS (DVT) Referring Phys: Wille Glaser KNAPP --------------------------------------------------------------------------------   Indications: Asymmetrical swelling x2 weeks LT>RT, s/p thoracic spine surgery 06/04/2022.  Risk Factors: History of DVT. Anticoagulation: Eliquis. Limitations: Poor ultrasound/tissue interface. Comparison Study: No prior studies available. Performing Technologist: Darlin Coco RDMS, RVT  Examination Guidelines: A complete evaluation includes B-mode imaging, spectral Doppler, color Doppler, and power Doppler as needed of all accessible portions of each vessel. Bilateral testing is considered an integral part of a complete examination. Limited examinations for reoccurring indications may be performed as noted. The reflux portion of the exam is performed with the patient in reverse Trendelenburg.  +-----+---------------+---------+-----------+----------+--------------+ RIGHTCompressibilityPhasicitySpontaneityPropertiesThrombus Aging +-----+---------------+---------+-----------+----------+--------------+ CFV  Full           Yes      Yes                                 +-----+---------------+---------+-----------+----------+--------------+   +---------+---------------+---------+-----------+----------+--------------+ LEFT     CompressibilityPhasicitySpontaneityPropertiesThrombus Aging +---------+---------------+---------+-----------+----------+--------------+ CFV      Full           Yes      Yes                                 +---------+---------------+---------+-----------+----------+--------------+ SFJ      Full                                                        +---------+---------------+---------+-----------+----------+--------------+ FV Prox  Full                                                        +---------+---------------+---------+-----------+----------+--------------+ FV Mid   Full                                                        +---------+---------------+---------+-----------+----------+--------------+ FV DistalFull                                                         +---------+---------------+---------+-----------+----------+--------------+ PFV      Full                                                        +---------+---------------+---------+-----------+----------+--------------+ POP      Full           Yes      Yes                                 +---------+---------------+---------+-----------+----------+--------------+  PTV      Full                                                        +---------+---------------+---------+-----------+----------+--------------+ PERO     Partial        No       No                   Acute          +---------+---------------+---------+-----------+----------+--------------+     Summary: RIGHT: - No evidence of common femoral vein obstruction.  LEFT: - Findings consistent with acute deep vein thrombosis involving the left peroneal veins. - No cystic structure found in the popliteal fossa.  *See table(s) above for measurements and observations. Electronically signed by Servando Snare MD on 06/21/2022 at 10:46:28 AM.    Final    DG Chest 2 View  Result Date: 06/21/2022 CLINICAL DATA:  Weakness EXAM: CHEST - 2 VIEW COMPARISON:  CXR 03/10/22 FINDINGS: Assessment of the bilateral lung apices limited due to patient positioning/chin down technique. No pleural effusion. No pneumothorax. No focal airspace opacity. Unchanged cardiac and mediastinal contours. Visualized upper abdomen is unremarkable. Possible age indeterminate fracture of the anterior/lateral left sixth rib. Visualized upper abdomen is unremarkable. IMPRESSION: 1. Possible age indeterminate fracture of the anterolateral left sixth rib. Correlate with point tenderness. 2. No focal airspace opacity. Electronically Signed   By: Marin Roberts M.D.   On: 06/21/2022 08:38    Labs: BNP (last 3 results) Recent Labs    06/29/22 1512 07/04/22 0321  BNP 785.8* [id number]*   Basic Metabolic Panel: Recent Labs  Lab 07/01/22 0348  07/02/22 0346 07/03/22 0257 07/04/22 0321 07/05/22 0444 07/06/22 0620 07/06/22 1333  NA 135 133* 137 136 136 135  --   K 3.6 3.7 4.3 3.6 3.2* 3.2* 4.3  CL 104 104 102 101 103 102  --   CO2 24 21* '24 23 27 26  '$ --   GLUCOSE 138* 197* 221* 185* 118* 103*  --   BUN 17 20 31* 37* 29* 21  --   CREATININE 0.72 0.76 0.79 0.83 0.72 0.76  --   CALCIUM 7.7* 7.9* 7.9* 7.7* 7.6* 7.7*  --   MG 1.7  --  2.1 2.0  --   --   --   PHOS 2.7  --  3.8  --   --   --   --    Liver Function Tests: Recent Labs  Lab 06/29/22 1512  AST 19  ALT 24  ALKPHOS 78  BILITOT 0.7  PROT 5.2*  ALBUMIN 2.4*   No results for input(s): "LIPASE", "AMYLASE" in the last 168 hours. No results for input(s): "AMMONIA" in the last 168 hours. CBC: Recent Labs  Lab 06/29/22 1512 06/30/22 0402 07/03/22 0257  WBC 9.0 7.6 11.7*  HGB 9.7* 10.0* 9.8*  HCT 29.8* 29.7* 29.8*  MCV 98.0 97.7 97.7  PLT 231 211 237   Cardiac Enzymes: No results for input(s): "CKTOTAL", "CKMB", "CKMBINDEX", "TROPONINI" in the last 168 hours. BNP: Invalid input(s): "POCBNP" CBG: Recent Labs  Lab 07/05/22 1208 07/05/22 1636 07/05/22 2112 07/06/22 0728 07/06/22 1134  GLUCAP 118* 173* 186* 108* 124*   D-Dimer No results for input(s): "DDIMER" in the last 72 hours. Hgb A1c No results for input(s): "HGBA1C" in  the last 72 hours. Lipid Profile No results for input(s): "CHOL", "HDL", "LDLCALC", "TRIG", "CHOLHDL", "LDLDIRECT" in the last 72 hours. Thyroid function studies No results for input(s): "TSH", "T4TOTAL", "T3FREE", "THYROIDAB" in the last 72 hours.  Invalid input(s): "FREET3" Anemia work up No results for input(s): "VITAMINB12", "FOLATE", "FERRITIN", "TIBC", "IRON", "RETICCTPCT" in the last 72 hours. Urinalysis    Component Value Date/Time   COLORURINE YELLOW 10/13/2020 1355   APPEARANCEUR HAZY (A) 10/13/2020 1355   LABSPEC 1.005 10/13/2020 1355   PHURINE 7.0 10/13/2020 1355   GLUCOSEU NEGATIVE 10/13/2020 1355   HGBUR  NEGATIVE 10/13/2020 1355   BILIRUBINUR NEGATIVE 10/13/2020 1355   KETONESUR NEGATIVE 10/13/2020 1355   PROTEINUR NEGATIVE 10/13/2020 1355   NITRITE NEGATIVE 10/13/2020 1355   LEUKOCYTESUR NEGATIVE 10/13/2020 1355   Sepsis Labs Recent Labs  Lab 06/29/22 1512 06/30/22 0402 07/03/22 0257  WBC 9.0 7.6 11.7*   Microbiology Recent Results (from the past 240 hour(s))  Resp panel by RT-PCR (RSV, Flu A&B, Covid) Anterior Nasal Swab     Status: None   Collection Time: 06/29/22  3:00 PM   Specimen: Anterior Nasal Swab  Result Value Ref Range Status   SARS Coronavirus 2 by RT PCR NEGATIVE NEGATIVE Final    Comment: (NOTE) SARS-CoV-2 target nucleic acids are NOT DETECTED.  The SARS-CoV-2 RNA is generally detectable in upper respiratory specimens during the acute phase of infection. The lowest concentration of SARS-CoV-2 viral copies this assay can detect is 138 copies/mL. A negative result does not preclude SARS-Cov-2 infection and should not be used as the sole basis for treatment or other patient management decisions. A negative result may occur with  improper specimen collection/handling, submission of specimen other than nasopharyngeal swab, presence of viral mutation(s) within the areas targeted by this assay, and inadequate number of viral copies(<138 copies/mL). A negative result must be combined with clinical observations, patient history, and epidemiological information. The expected result is Negative.  Fact Sheet for Patients:  EntrepreneurPulse.com.au  Fact Sheet for Healthcare Providers:  IncredibleEmployment.be  This test is no t yet approved or cleared by the [place] FDA and  has been authorized for detection and/or diagnosis of SARS-CoV-2 by FDA under an Emergency Use Authorization (EUA). This EUA will remain  in effect (meaning this test can be used) for the duration of the COVID-19 declaration under Section 564(b)(1) of the  Act, 21 U.S.C.section 360bbb-3(b)(1), unless the authorization is terminated  or revoked sooner.       Influenza A by PCR NEGATIVE NEGATIVE Final   Influenza B by PCR NEGATIVE NEGATIVE Final    Comment: (NOTE) The Xpert Xpress SARS-CoV-2/FLU/RSV plus assay is intended as an aid in the diagnosis of influenza from Nasopharyngeal swab specimens and should not be used as a sole basis for treatment. Nasal washings and aspirates are unacceptable for Xpert Xpress SARS-CoV-2/FLU/RSV testing.  Fact Sheet for Patients: EntrepreneurPulse.com.au  Fact Sheet for Healthcare Providers: IncredibleEmployment.be  This test is not yet approved or cleared by the [place] FDA and has been authorized for detection and/or diagnosis of SARS-CoV-2 by FDA under an Emergency Use Authorization (EUA). This EUA will remain in effect (meaning this test can be used) for the duration of the COVID-19 declaration under Section 564(b)(1) of the Act, 21 U.S.C. section 360bbb-3(b)(1), unless the authorization is terminated or revoked.     Resp Syncytial Virus by PCR NEGATIVE NEGATIVE Final    Comment: (NOTE) Fact Sheet for Patients: EntrepreneurPulse.com.au  Fact Sheet for Healthcare Providers: IncredibleEmployment.be  This test is not yet approved or cleared by the [place] and has been authorized for detection and/or diagnosis of SARS-CoV-2 by FDA under an Emergency Use Authorization (EUA). This EUA will remain in effect (meaning this test can be used) for the duration of the COVID-19 declaration under Section 564(b)(1) of the Act, 21 U.S.C. section 360bbb-3(b)(1), unless the authorization is terminated or revoked.  Performed at [facility], [place] [address], [place], [place] [address]   Culture, blood (Routine X 2) w Reflex to ID Panel     Status: None   Collection Time: 06/30/22 12:40 PM   Specimen:  BLOOD LEFT ARM  Result Value Ref Range Status   Specimen Description   Final    BLOOD LEFT ARM Performed at [facility] [address], [place], [place] [address]    Special Requests   Final    AEROBIC BOTTLE ONLY Blood Culture adequate volume Performed at [facility] [address] [place], [place] [address]    Culture   Final    NO GROWTH 5 DAYS Performed at [facility], [place] [address] [place], [place] [address]    Report Status 07/05/2022 FINAL  Final  Culture, blood (Routine X 2) w Reflex to ID Panel     Status: None   Collection Time: 06/30/22 12:40 PM   Specimen: BLOOD RIGHT HAND  Result Value Ref Range Status   Specimen Description   Final    BLOOD RIGHT HAND Performed at [facility] [address] [place], [place] [address]    Special Requests   Final    AEROBIC BOTTLE ONLY Blood Culture adequate volume Performed at [facility] [address], [place], [place] [address]    Culture   Final    NO GROWTH 5 DAYS Performed at [facility], [place] [address] [place], [place] [address]    Report Status 07/05/2022 FINAL  Final  MRSA Next Gen by PCR, Nasal     Status: None   Collection Time: 07/01/22 11:50 AM   Specimen: Nasal Mucosa; Nasal Swab  Result Value Ref Range Status   MRSA by PCR Next Gen NOT DETECTED NOT DETECTED Final    Comment: (NOTE) The GeneXpert MRSA Assay (FDA approved for NASAL specimens only), is one component of a comprehensive MRSA colonization surveillance program. It is not intended to diagnose MRSA infection nor to guide or monitor treatment for MRSA infections. Test performance is not FDA approved in patients less than 58 years old. Performed at [facility], [place] [address] [place], [place] [address]   Respiratory (~20 pathogens) panel by PCR     Status: Abnormal   Collection Time: 07/01/22  7:13 PM   Specimen: Nasopharyngeal  Swab; Respiratory  Result Value Ref Range Status   Adenovirus NOT DETECTED NOT DETECTED Final   Coronavirus 229E NOT DETECTED NOT DETECTED Final    Comment: (NOTE) The Coronavirus on the Respiratory Panel, DOES NOT test for the novel  Coronavirus (2019 nCoV)    Coronavirus HKU1 NOT DETECTED NOT DETECTED Final   Coronavirus NL63 NOT DETECTED NOT DETECTED Final   Coronavirus OC43 DETECTED (A) NOT DETECTED Final   Metapneumovirus NOT DETECTED NOT DETECTED Final   Rhinovirus / Enterovirus NOT DETECTED NOT DETECTED Final   Influenza A NOT DETECTED NOT DETECTED Final   Influenza B NOT DETECTED NOT DETECTED Final   Parainfluenza Virus 1 NOT DETECTED NOT DETECTED  Final   Parainfluenza Virus 2 NOT DETECTED NOT DETECTED Final   Parainfluenza Virus 3 NOT DETECTED NOT DETECTED Final   Parainfluenza Virus 4 NOT DETECTED NOT DETECTED Final   Respiratory Syncytial Virus NOT DETECTED NOT DETECTED Final   Bordetella pertussis NOT DETECTED NOT DETECTED Final   Bordetella Parapertussis NOT DETECTED NOT DETECTED Final   Chlamydophila pneumoniae NOT DETECTED NOT DETECTED Final   Mycoplasma pneumoniae NOT DETECTED NOT DETECTED Final    Comment: Performed at [facility], [place] [address] [place], [place] [address]     Time coordinating discharge: 35 minutes  SIGNED: Antonieta Pert, MD  [facility] 07/06/2022, 2:52 PM  If 7PM-7AM, please contact night-coverage [email or web address]

## 2022-07-06 NOTE — Progress Notes (Addendum)
Rounding Note    Patient Name: MINDY VERZOSA Date of Encounter: 07/06/2022  [place] Cardiologist: [name], MD   Subjective   Patient reports that he is feeling well today. Denies chest pain, dizziness, syncope, near syncope. He can tell he is in afib because there is a fluttering in his chest. Per telemetry, HR well controlled. Breathing is improving, wants to go home   Inpatient Medications    Scheduled Meds:  acyclovir  400 mg Oral BID   [START ON 07/10/2022] amiodarone  200 mg Oral Daily   amiodarone  400 mg Oral BID   apixaban  5 mg Oral BID   guaiFENesin  600 mg Oral BID   hydrALAZINE  25 mg Oral Q8H   insulin aspart  0-15 Units Subcutaneous TID WC   lisinopril  40 mg Oral Daily   metoprolol tartrate  50 mg Oral BID   potassium chloride  40 mEq Oral Once   predniSONE  40 mg Oral Q breakfast   Followed by   Derrill Memo ON 07/09/2022] predniSONE  20 mg Oral Q breakfast   Continuous Infusions:  potassium chloride     PRN Meds: acetaminophen, docusate sodium, fluticasone, hydrALAZINE, mouth rinse, sodium chloride   Vital Signs    Vitals:   07/05/22 1405 07/05/22 2118 07/06/22 0500 07/06/22 0500  BP: 126/73 (!) 154/81  (!) 153/95  Pulse: 82 85  72  Resp: '16 17  18  '$ Temp: 97.8 F (36.6 C)   97.8 F (36.6 C)  TempSrc:    Oral  SpO2: 96% 95%  95%  Weight:   108 kg   Height:        Intake/Output Summary (Last 24 hours) at 07/06/2022 0840 Last data filed at 07/06/2022 0837 Gross per 24 hour  Intake 360 ml  Output 1625 ml  Net -1265 ml      07/06/2022    5:00 AM 07/05/2022    5:17 AM 07/04/2022    5:00 AM  Last 3 Weights  Weight (lbs) 238 lb 1.6 oz 248 lb 10.9 oz 238 lb 12.1 oz  Weight (kg) 108 kg 112.8 kg 108.3 kg      Telemetry    Atrial fibrillation, HR in the 80s-90s - Personally Reviewed  ECG    No new tracings since 2/27 - Personally Reviewed  Physical Exam   GEN: No acute distress.  Sitting in the recliner with legs elevated. Neck:  No JVD Cardiac: Irregular rate and rhythm, no murmurs. Radial pulses 2+ bilaterally  Respiratory: Crackles in bilateral lung bases, R>L. Normal WOB on 1L supplemental oxygen via [place]  GI: Soft, nontender, non-distended  MS: No edema in BLE. Wearing compression stockings to the knees; No deformity. Neuro:  Nonfocal  Psych: Normal affect   Labs    High Sensitivity Troponin:  No results for input(s): "TROPONINIHS" in the last 720 hours.   Chemistry Recent Labs  Lab 06/29/22 1512 06/30/22 0402 07/01/22 0348 07/02/22 0346 07/03/22 0257 07/04/22 0321 07/05/22 0444 07/06/22 0620  NA 133*   < > 135   < > 137 136 136 135  K 3.2*   < > 3.6   < > 4.3 3.6 3.2* 3.2*  CL 102   < > 104   < > 102 101 103 102  CO2 24   < > 24   < > '24 23 27 26  '$ GLUCOSE 174*   < > 138*   < > 221* 185* 118* 103*  BUN 17   < > 17   < > 31* 37* 29* 21  CREATININE 0.79   < > 0.72   < > 0.79 0.83 0.72 0.76  CALCIUM 8.0*   < > 7.7*   < > 7.9* 7.7* 7.6* 7.7*  MG  --   --  1.7  --  2.1 2.0  --   --   PROT 5.2*  --   --   --   --   --   --   --   ALBUMIN 2.4*  --   --   --   --   --   --   --   AST 19  --   --   --   --   --   --   --   ALT 24  --   --   --   --   --   --   --   ALKPHOS 78  --   --   --   --   --   --   --   BILITOT 0.7  --   --   --   --   --   --   --   GFRNONAA >60   < > >60   < > >60 >60 >60 >60  ANIONGAP 7   < > 7   < > '11 12 6 7   '$ < > = values in this interval not displayed.    Lipids No results for input(s): "CHOL", "TRIG", "HDL", "LABVLDL", "LDLCALC", "CHOLHDL" in the last 168 hours.  Hematology Recent Labs  Lab 06/29/22 1512 06/30/22 0402 07/03/22 0257  WBC 9.0 7.6 11.7*  RBC 3.04* 3.04* 3.05*  HGB 9.7* 10.0* 9.8*  HCT 29.8* 29.7* 29.8*  MCV 98.0 97.7 97.7  MCH 31.9 32.9 32.1  MCHC 32.6 33.7 32.9  RDW 16.3* 16.4* 16.2*  PLT 231 211 237   Thyroid No results for input(s): "TSH", "FREET4" in the last 168 hours.  BNP Recent Labs  Lab 06/29/22 1512 07/04/22 0321  BNP 785.8*  293.9*    DDimer No results for input(s): "DDIMER" in the last 168 hours.   Radiology    No results found.  Cardiac Studies   Echocardiogram 06/22/22 1. Appears to be in sinus rhythm at the time of evaluation- A waves  present on diastology. Left ventricular ejection fraction, by estimation,  is 60 to 65%. The left ventricle has normal function. The left ventricle  has no regional wall motion  abnormalities. There is mild concentric left ventricular hypertrophy. Left  ventricular diastolic parameters are consistent with Grade II diastolic  dysfunction (pseudonormalization).   2. Right ventricular systolic function is normal. The right ventricular  size is normal. There is normal pulmonary artery systolic pressure.   3. The mitral valve is grossly normal. No evidence of mitral valve  regurgitation.   4. The aortic valve is tricuspid. Aortic valve regurgitation is not  visualized. No aortic stenosis is present.   5. The inferior vena cava is normal in size with greater than 50%  respiratory variability, suggesting right atrial pressure of 3 mmHg.   Patient Profile     72 y.o. male with a hx of HTN, HLD, OSA on CPAP, multiple myeloma on chemo/radiation therapy, persistent atrial fibrillation s/p ablation 2018, and recurrent DVT   Assessment & Plan    Persistent atrial fibrillation  - Per telemetry, patient remains in atrial fibrillation but HR is overall well controlled.  -  Continue PO amiodarone 400 mg BID for 5 days, then decrease to 200 mg daily  - Continue metoprolol tartrate 50 mg BID  - Continue eliquis 5 mg BID   Acute on Chronic Diastolic Heart Failure  - Echocardiogram 2/20 showed EF 60-65%, mild LVH with grade II diastolic dysfunction  - BNP was elevated to 785.8 on 2/27. When rechecked on 3/3, BNP was down to 293.9 - Patient was on IV lasix earlier this admission. Has been off lasix since 3/3 - Even off lasix, patient continues to have good urine output. Put out 1.4  L urine yesterday and is currently net -7.37 L since admission. He is euvolemic on exam  - Can continue PRN lasix for ankle edema, weight gain  - Renal function stable. K low at 3.2. Potassium supplementation has been ordered by primary team this AM   HTN  - BP overall well controlled on current medications   Otherwise per primary  - Viral Pneumonia  - History of DVT  - Recent pathologic thoracic spine fracture  - Multiple myeloma with pathologic fracture of T2/cord compression  - Chronic anemia   For questions or updates, please contact [facility] Please consult [email or web address] for contact info under      Signed, Margie Billet, PA-C  07/06/2022, 8:40 AM    Patient seen and examined, note reviewed with the signed Advanced Practice Provider. I personally reviewed laboratory data, imaging studies and relevant notes. I independently examined the patient and formulated the important aspects of the plan. I have personally discussed the plan with the patient and/or family. Comments or changes to the note/plan are indicated below.  Patient seen and examined at his bedside. He feels better. No complaints at this time.  Amiodarone transitioned to po yesterday - he is still in atrial fibrillation but rate controlled.  Please continue current medication regimen. He can be discharged to home  [facility] will sign off.   Medication Recommendations:  Amiodarone 400 mg twice daily for 4 days, then 200 mg once daily, Apixiban 5 mg twice daily Other recommendations (labs, testing, etc):  none Follow up as an outpatient:  routine visit with cardiology    Pollyann Glen, [place] [facility] Attending Cardiologist [place]  [address] [place], [place] [address] [phone] Website: [email or web address]

## 2022-07-06 NOTE — Progress Notes (Signed)
Physical Therapy Treatment Patient Details Name: Travis [name] MRN: [id number] DOB: 08-Jul-1950 Today's Date: 07/06/2022   History of Present Illness 72 yo male admitted with bilateral PNA.  Hx of with metastatic multiple myeloma on chemotherapy, A fib, Arthritis, DVT, GERD, Hiatal hernia, HTN, Multiple myeloma, OSA, Peripheral neuropathy, Cervical spinal stenosis.  Recent T2 tumor resection, PLIF T1-T4 05/28/22    PT Comments    Pt agreeable to therapy session. He reports feeling better today compared to yesterday. He tolerated session well. O2 >90% on RA throughout session-removed [place] O2 once back in room-made NT aware. Pt stated he feels better about discharging home and he is hopeful he will get to d/c today. Will continue to follow pt during this hospital stay. He is agreeable to resuming HHPT.    Recommendations for follow up therapy are one component of a multi-disciplinary discharge planning process, led by the attending physician.  Recommendations may be updated based on patient status, additional functional criteria and insurance authorization.  Follow Up Recommendations  Home health PT     Assistance Recommended at Discharge Frequent or constant Supervision/Assistance  Patient can return home with the following A little help with walking and/or transfers;A little help with bathing/dressing/bathroom;Assistance with cooking/housework;Assist for transportation;Help with stairs or ramp for entrance   Equipment Recommendations  None recommended by PT    Recommendations for Other Services       Precautions / Restrictions Precautions Precautions: Fall;Back Restrictions Weight Bearing Restrictions: No     Mobility  Bed Mobility               General bed mobility comments: oob in recliner    Transfers Overall transfer level: Needs assistance Equipment used: Rolling walker (2 wheels) Transfers: Sit to/from Stand Sit to Stand: Supervision           General  transfer comment: Cues for hand placement. Tends to want to "park" walker before sitting    Ambulation/Gait Ambulation/Gait assistance: Supervision Gait Distance (Feet): 140 Feet Assistive device: Rolling walker (2 wheels) Gait Pattern/deviations: Step-through pattern, Decreased stride length       General Gait Details: Supv for safety, lines/equipment, monitoring SpO2. O2 initially fluctuated a bit but stabilized with better finger/pulse ox positioning-O2 reading 95% on RA. Pt denied dizziness, lightheadedness. He tolerated distance well.   Stairs             Wheelchair Mobility    Modified Rankin (Stroke Patients Only)       Balance Overall balance assessment: Needs assistance         Standing balance support: During functional activity, Reliant on assistive device for balance, Bilateral upper extremity supported Standing balance-Leahy Scale: Fair                              Cognition Arousal/Alertness: Awake/alert Behavior During Therapy: WFL for tasks assessed/performed Overall Cognitive Status: Within Functional Limits for tasks assessed                                          Exercises      General Comments        Pertinent Vitals/Pain Pain Assessment Pain Assessment: Faces Faces Pain Scale: Hurts a little bit Pain Location: no specific pain report but did report some increased L LE numbness Pain Descriptors / Indicators: Numbness Pain Intervention(s): Monitored  during session, Repositioned    Home Living                          Prior Function            PT Goals (current goals can now be found in the care plan section) Progress towards PT goals: Progressing toward goals    Frequency    Min 3X/week      PT Plan Current plan remains appropriate    Co-evaluation              AM-PAC PT "6 Clicks" Mobility   Outcome Measure  Help needed turning from your back to your side while in a  flat bed without using bedrails?: A Little Help needed moving from lying on your back to sitting on the side of a flat bed without using bedrails?: A Little Help needed moving to and from a bed to a chair (including a wheelchair)?: A Little Help needed standing up from a chair using your arms (e.g., wheelchair or bedside chair)?: A Little Help needed to walk in hospital room?: A Little Help needed climbing 3-5 steps with a railing? : A Little 6 Click Score: 18    End of Session Equipment Utilized During Treatment: Gait belt Activity Tolerance: Patient tolerated treatment well Patient left: in chair;with call bell/phone within reach;with chair alarm set Nurse Communication:  (made NT aware I removed [place] O2 since sats were >90% on RA throughout session) PT Visit Diagnosis: Difficulty in walking, not elsewhere classified (R26.2)     Time: [id number] PT Time Calculation (min) (ACUTE ONLY): 20 min  Charges:  $Gait Training: 8-22 mins                        Doreatha Massed, PT Acute Rehabilitation  Office: [phone]

## 2022-07-07 ENCOUNTER — Encounter: Payer: Self-pay | Admitting: Radiation Oncology

## 2022-07-07 ENCOUNTER — Other Ambulatory Visit: Payer: Self-pay

## 2022-07-07 ENCOUNTER — Ambulatory Visit: Payer: Medicare Other

## 2022-07-07 ENCOUNTER — Ambulatory Visit
Admission: RE | Admit: 2022-07-07 | Discharge: 2022-07-07 | Disposition: A | Discharge status: Home / Self Care | Payer: Medicare Other | Source: Ambulatory Visit | Attending: Radiation Oncology | Admitting: Radiation Oncology

## 2022-07-07 ENCOUNTER — Inpatient Hospital Stay: Payer: Medicare Other

## 2022-07-07 VITALS — BP 107/71 | HR 66 | Temp 98.1°F | Resp 19 | Ht 70.0 in | Wt 238.0 lb

## 2022-07-07 DIAGNOSIS — Z923 Personal history of irradiation: Secondary | ICD-10-CM | POA: Diagnosis not present

## 2022-07-07 DIAGNOSIS — M8458XA Pathological fracture in neoplastic disease, other specified site, initial encounter for fracture: Secondary | ICD-10-CM | POA: Diagnosis not present

## 2022-07-07 DIAGNOSIS — Z510 Encounter for antineoplastic radiation therapy: Secondary | ICD-10-CM | POA: Insufficient documentation

## 2022-07-07 DIAGNOSIS — Z5112 Encounter for antineoplastic immunotherapy: Secondary | ICD-10-CM | POA: Diagnosis not present

## 2022-07-07 DIAGNOSIS — Z7901 Long term (current) use of anticoagulants: Secondary | ICD-10-CM | POA: Insufficient documentation

## 2022-07-07 DIAGNOSIS — Z86718 Personal history of other venous thrombosis and embolism: Secondary | ICD-10-CM | POA: Diagnosis not present

## 2022-07-07 DIAGNOSIS — D472 Monoclonal gammopathy: Secondary | ICD-10-CM | POA: Insufficient documentation

## 2022-07-07 DIAGNOSIS — E876 Hypokalemia: Secondary | ICD-10-CM | POA: Diagnosis not present

## 2022-07-07 DIAGNOSIS — M4850XA Collapsed vertebra, not elsewhere classified, site unspecified, initial encounter for fracture: Secondary | ICD-10-CM | POA: Insufficient documentation

## 2022-07-07 DIAGNOSIS — C9000 Multiple myeloma not having achieved remission: Secondary | ICD-10-CM | POA: Diagnosis not present

## 2022-07-08 ENCOUNTER — Ambulatory Visit: Payer: Medicare Other

## 2022-07-08 DIAGNOSIS — G4733 Obstructive sleep apnea (adult) (pediatric): Secondary | ICD-10-CM | POA: Diagnosis not present

## 2022-07-08 DIAGNOSIS — I119 Hypertensive heart disease without heart failure: Secondary | ICD-10-CM | POA: Diagnosis not present

## 2022-07-08 DIAGNOSIS — E559 Vitamin D deficiency, unspecified: Secondary | ICD-10-CM | POA: Diagnosis not present

## 2022-07-08 DIAGNOSIS — M1990 Unspecified osteoarthritis, unspecified site: Secondary | ICD-10-CM | POA: Diagnosis not present

## 2022-07-08 DIAGNOSIS — C9000 Multiple myeloma not having achieved remission: Secondary | ICD-10-CM | POA: Diagnosis not present

## 2022-07-08 DIAGNOSIS — G629 Polyneuropathy, unspecified: Secondary | ICD-10-CM | POA: Diagnosis not present

## 2022-07-08 DIAGNOSIS — Z4789 Encounter for other orthopedic aftercare: Secondary | ICD-10-CM | POA: Diagnosis not present

## 2022-07-08 DIAGNOSIS — I480 Paroxysmal atrial fibrillation: Secondary | ICD-10-CM | POA: Diagnosis not present

## 2022-07-08 DIAGNOSIS — Z981 Arthrodesis status: Secondary | ICD-10-CM | POA: Diagnosis not present

## 2022-07-09 ENCOUNTER — Telehealth: Payer: Self-pay | Admitting: Internal Medicine

## 2022-07-09 ENCOUNTER — Encounter: Payer: Self-pay | Admitting: Internal Medicine

## 2022-07-09 ENCOUNTER — Ambulatory Visit: Payer: Medicare Other

## 2022-07-09 NOTE — Telephone Encounter (Signed)
  Pt requesting to speak with nurse regarding his recent hospital stay. He said, he still have issue with his afib and would like to discuss it with a nurse

## 2022-07-09 NOTE — Telephone Encounter (Signed)
Follow Up:    Patient said he talked to the nurse earlier today. He said he needs to talk to her again please.

## 2022-07-09 NOTE — Telephone Encounter (Signed)
Spoke with DOD Dr. Ali Lowe in regards to pt concerns.  MD advised that pt take an additional metoprolol tartrate 50 mg for HR above 120.  Also advised that pt is already on optimal therapy for DVT (Eliquis) advised that pt continue to monitor left leg and go to the ED if leg becomes painful. Called pt advised of MD recommendations.  Current HR 106 reports took metoprolol tartrate 50 mg at 9 am.  Advised pt does not need additional metoprolol at this time.  If HR goes above 120 med is warranted.  Pt expresses understanding.   Pt will monitor leg for swelling/ pain/ SOB if symptoms develop will go to ED for evaluation.   Advised pt to call back with further questions or concerns.

## 2022-07-09 NOTE — Telephone Encounter (Signed)
Called pt who wants to send in a picture of his left foot for MD to review.  I advised pt to also  send picture to PCP office for f/u.  Pt expresses he will has an OV with PCP on Monday.  Picture received and sent to [name] to review via my chart encounter.

## 2022-07-09 NOTE — Telephone Encounter (Signed)
Called pt in regards to Afib concerns post hospital stay.  Reports was in the hospital for double PNA.  Had trouble with Afib while in the hospital.  When left hospital HR 70-90.  HR now 120-125 feels like heart is racing.   BP 124/83-124.  Took metoprolol tartrate 12.5 mg in the middle of the night with no improvement in HR.   Reviewed meds is currently taking eliquis 5 mg PO BID, amiodarone 400 mg PO BID and metoprolol tartrate 50 mg PO BID.     Pt also concerned that left foot near ankle is red/purple in color, not swollen and is a little tender.  Pt reports can feel a pulse in foot.   Has a hx of DVT to LLE. Wants to know if needs an additional scan.  Last positive scan on 06/21/22.  Will send to MD to address.

## 2022-07-09 NOTE — Telephone Encounter (Signed)
Spoke with pt advised to send picture to PCP office for f/u.  Pt reports has an OV with PCP on Monday.  Previously reviewed ED precautions with pt.    Pt testing on 2/19 positive for DVT to left lower leg.  Spoke with DOD Dr. Ali Lowe who reports pt is already on Eliquis if symptoms worsen to go to ED.

## 2022-07-12 ENCOUNTER — Ambulatory Visit: Payer: Medicare Other

## 2022-07-12 ENCOUNTER — Other Ambulatory Visit (HOSPITAL_COMMUNITY): Payer: Self-pay

## 2022-07-12 DIAGNOSIS — E876 Hypokalemia: Secondary | ICD-10-CM | POA: Diagnosis not present

## 2022-07-12 DIAGNOSIS — M8458XA Pathological fracture in neoplastic disease, other specified site, initial encounter for fracture: Secondary | ICD-10-CM | POA: Diagnosis not present

## 2022-07-12 DIAGNOSIS — Z923 Personal history of irradiation: Secondary | ICD-10-CM | POA: Diagnosis not present

## 2022-07-12 DIAGNOSIS — Z510 Encounter for antineoplastic radiation therapy: Secondary | ICD-10-CM | POA: Diagnosis not present

## 2022-07-12 DIAGNOSIS — C9002 Multiple myeloma in relapse: Secondary | ICD-10-CM | POA: Diagnosis not present

## 2022-07-12 DIAGNOSIS — Z7901 Long term (current) use of anticoagulants: Secondary | ICD-10-CM | POA: Diagnosis not present

## 2022-07-12 DIAGNOSIS — I480 Paroxysmal atrial fibrillation: Secondary | ICD-10-CM | POA: Diagnosis not present

## 2022-07-12 DIAGNOSIS — C9000 Multiple myeloma not having achieved remission: Secondary | ICD-10-CM | POA: Diagnosis not present

## 2022-07-12 DIAGNOSIS — Z86718 Personal history of other venous thrombosis and embolism: Secondary | ICD-10-CM | POA: Diagnosis not present

## 2022-07-12 DIAGNOSIS — I5032 Chronic diastolic (congestive) heart failure: Secondary | ICD-10-CM | POA: Diagnosis not present

## 2022-07-12 DIAGNOSIS — Z5112 Encounter for antineoplastic immunotherapy: Secondary | ICD-10-CM | POA: Diagnosis not present

## 2022-07-12 DIAGNOSIS — I110 Hypertensive heart disease with heart failure: Secondary | ICD-10-CM | POA: Diagnosis not present

## 2022-07-12 DIAGNOSIS — D6869 Other thrombophilia: Secondary | ICD-10-CM | POA: Diagnosis not present

## 2022-07-13 ENCOUNTER — Ambulatory Visit: Payer: Medicare Other

## 2022-07-13 ENCOUNTER — Other Ambulatory Visit: Payer: Self-pay

## 2022-07-13 ENCOUNTER — Ambulatory Visit: Payer: Medicare Other | Admitting: Physician Assistant

## 2022-07-13 ENCOUNTER — Ambulatory Visit
Admission: RE | Admit: 2022-07-13 | Discharge: 2022-07-13 | Disposition: A | Discharge status: Home / Self Care | Payer: Medicare Other | Source: Ambulatory Visit | Attending: Radiation Oncology | Admitting: Radiation Oncology

## 2022-07-13 ENCOUNTER — Other Ambulatory Visit: Payer: Medicare Other

## 2022-07-13 DIAGNOSIS — Z86718 Personal history of other venous thrombosis and embolism: Secondary | ICD-10-CM | POA: Diagnosis not present

## 2022-07-13 DIAGNOSIS — M1990 Unspecified osteoarthritis, unspecified site: Secondary | ICD-10-CM | POA: Diagnosis not present

## 2022-07-13 DIAGNOSIS — E559 Vitamin D deficiency, unspecified: Secondary | ICD-10-CM | POA: Diagnosis not present

## 2022-07-13 DIAGNOSIS — Z981 Arthrodesis status: Secondary | ICD-10-CM | POA: Diagnosis not present

## 2022-07-13 DIAGNOSIS — D472 Monoclonal gammopathy: Secondary | ICD-10-CM

## 2022-07-13 DIAGNOSIS — I119 Hypertensive heart disease without heart failure: Secondary | ICD-10-CM | POA: Diagnosis not present

## 2022-07-13 DIAGNOSIS — C9000 Multiple myeloma not having achieved remission: Secondary | ICD-10-CM | POA: Diagnosis not present

## 2022-07-13 DIAGNOSIS — E876 Hypokalemia: Secondary | ICD-10-CM | POA: Diagnosis not present

## 2022-07-13 DIAGNOSIS — Z4789 Encounter for other orthopedic aftercare: Secondary | ICD-10-CM | POA: Diagnosis not present

## 2022-07-13 DIAGNOSIS — Z7901 Long term (current) use of anticoagulants: Secondary | ICD-10-CM | POA: Diagnosis not present

## 2022-07-13 DIAGNOSIS — G629 Polyneuropathy, unspecified: Secondary | ICD-10-CM | POA: Diagnosis not present

## 2022-07-13 DIAGNOSIS — G4733 Obstructive sleep apnea (adult) (pediatric): Secondary | ICD-10-CM | POA: Diagnosis not present

## 2022-07-13 DIAGNOSIS — M8458XA Pathological fracture in neoplastic disease, other specified site, initial encounter for fracture: Secondary | ICD-10-CM | POA: Diagnosis not present

## 2022-07-13 DIAGNOSIS — Z923 Personal history of irradiation: Secondary | ICD-10-CM | POA: Diagnosis not present

## 2022-07-13 DIAGNOSIS — Z5112 Encounter for antineoplastic immunotherapy: Secondary | ICD-10-CM | POA: Diagnosis not present

## 2022-07-13 DIAGNOSIS — I480 Paroxysmal atrial fibrillation: Secondary | ICD-10-CM | POA: Diagnosis not present

## 2022-07-13 DIAGNOSIS — Z510 Encounter for antineoplastic radiation therapy: Secondary | ICD-10-CM | POA: Diagnosis not present

## 2022-07-13 LAB — RAD ONC ARIA SESSION SUMMARY
Course Elapsed Days: 0
Plan Fractions Treated to Date: 1
Plan Prescribed Dose Per Fraction: 3 Gy
Plan Total Fractions Prescribed: 9
Plan Total Prescribed Dose: 27 Gy
Reference Point Dosage Given to Date: 3 Gy
Reference Point Session Dosage Given: 3 Gy
Session Number: 1

## 2022-07-14 ENCOUNTER — Other Ambulatory Visit (HOSPITAL_COMMUNITY): Payer: Self-pay

## 2022-07-14 ENCOUNTER — Inpatient Hospital Stay: Payer: Medicare Other

## 2022-07-14 ENCOUNTER — Other Ambulatory Visit: Payer: Self-pay

## 2022-07-14 ENCOUNTER — Other Ambulatory Visit: Payer: Self-pay | Admitting: *Deleted

## 2022-07-14 ENCOUNTER — Inpatient Hospital Stay (HOSPITAL_BASED_OUTPATIENT_CLINIC_OR_DEPARTMENT_OTHER): Payer: Medicare Other | Admitting: Physician Assistant

## 2022-07-14 ENCOUNTER — Ambulatory Visit
Admission: RE | Admit: 2022-07-14 | Discharge: 2022-07-14 | Disposition: A | Discharge status: Home / Self Care | Payer: Medicare Other | Source: Ambulatory Visit | Attending: Radiation Oncology | Admitting: Radiation Oncology

## 2022-07-14 VITALS — BP 131/81 | HR 124 | Temp 97.0°F | Resp 14 | Wt 239.1 lb

## 2022-07-14 DIAGNOSIS — J90 Pleural effusion, not elsewhere classified: Secondary | ICD-10-CM | POA: Insufficient documentation

## 2022-07-14 DIAGNOSIS — M47814 Spondylosis without myelopathy or radiculopathy, thoracic region: Secondary | ICD-10-CM | POA: Insufficient documentation

## 2022-07-14 DIAGNOSIS — M5126 Other intervertebral disc displacement, lumbar region: Secondary | ICD-10-CM | POA: Insufficient documentation

## 2022-07-14 DIAGNOSIS — E876 Hypokalemia: Secondary | ICD-10-CM | POA: Insufficient documentation

## 2022-07-14 DIAGNOSIS — Z510 Encounter for antineoplastic radiation therapy: Secondary | ICD-10-CM | POA: Diagnosis not present

## 2022-07-14 DIAGNOSIS — Z79899 Other long term (current) drug therapy: Secondary | ICD-10-CM | POA: Insufficient documentation

## 2022-07-14 DIAGNOSIS — Z7901 Long term (current) use of anticoagulants: Secondary | ICD-10-CM | POA: Insufficient documentation

## 2022-07-14 DIAGNOSIS — Z5112 Encounter for antineoplastic immunotherapy: Secondary | ICD-10-CM | POA: Insufficient documentation

## 2022-07-14 DIAGNOSIS — I480 Paroxysmal atrial fibrillation: Secondary | ICD-10-CM | POA: Insufficient documentation

## 2022-07-14 DIAGNOSIS — G629 Polyneuropathy, unspecified: Secondary | ICD-10-CM | POA: Insufficient documentation

## 2022-07-14 DIAGNOSIS — C9000 Multiple myeloma not having achieved remission: Secondary | ICD-10-CM

## 2022-07-14 DIAGNOSIS — Z86718 Personal history of other venous thrombosis and embolism: Secondary | ICD-10-CM | POA: Insufficient documentation

## 2022-07-14 DIAGNOSIS — Z881 Allergy status to other antibiotic agents status: Secondary | ICD-10-CM | POA: Insufficient documentation

## 2022-07-14 DIAGNOSIS — M533 Sacrococcygeal disorders, not elsewhere classified: Secondary | ICD-10-CM | POA: Insufficient documentation

## 2022-07-14 DIAGNOSIS — Z7969 Long term (current) use of other immunomodulators and immunosuppressants: Secondary | ICD-10-CM | POA: Insufficient documentation

## 2022-07-14 DIAGNOSIS — Z923 Personal history of irradiation: Secondary | ICD-10-CM | POA: Diagnosis not present

## 2022-07-14 DIAGNOSIS — Z7961 Long term (current) use of immunomodulator: Secondary | ICD-10-CM | POA: Insufficient documentation

## 2022-07-14 DIAGNOSIS — M8458XA Pathological fracture in neoplastic disease, other specified site, initial encounter for fracture: Secondary | ICD-10-CM | POA: Diagnosis not present

## 2022-07-14 LAB — CMP (CANCER CENTER ONLY)
ALT: 32 U/L (ref 0–44)
AST: 14 U/L — ABNORMAL LOW (ref 15–41)
Albumin: 3.6 g/dL (ref 3.5–5.0)
Alkaline Phosphatase: 113 U/L (ref 38–126)
Anion gap: 6 (ref 5–15)
BUN: 21 mg/dL (ref 8–23)
CO2: 29 mmol/L (ref 22–32)
Calcium: 8.7 mg/dL — ABNORMAL LOW (ref 8.9–10.3)
Chloride: 105 mmol/L (ref 98–111)
Creatinine: 0.9 mg/dL (ref 0.61–1.24)
GFR, Estimated: >60 mL/min (ref >60)
Glucose, Bld: 103 mg/dL — ABNORMAL HIGH (ref 70–99)
Potassium: 4.1 mmol/L (ref 3.5–5.1)
Sodium: 140 mmol/L (ref 135–145)
Total Bilirubin: 0.4 mg/dL (ref 0.3–1.2)
Total Protein: 5.9 g/dL — ABNORMAL LOW (ref 6.5–8.1)

## 2022-07-14 LAB — CBC WITH DIFFERENTIAL (CANCER CENTER ONLY)
Abs Immature Granulocytes: 0.07 10*3/uL (ref 0.00–0.07)
Basophils Absolute: 0.1 10*3/uL (ref 0.0–0.1)
Basophils Relative: 1 %
Eosinophils Absolute: 0 10*3/uL (ref 0.0–0.5)
Eosinophils Relative: 0 %
HCT: 33.3 % — ABNORMAL LOW (ref 39.0–52.0)
Hemoglobin: 10.9 g/dL — ABNORMAL LOW (ref 13.0–17.0)
Immature Granulocytes: 1 %
Lymphocytes Relative: 14 %
Lymphs Abs: 1.2 10*3/uL (ref 0.7–4.0)
MCH: 32.9 pg (ref 26.0–34.0)
MCHC: 32.7 g/dL (ref 30.0–36.0)
MCV: 100.6 fL — ABNORMAL HIGH (ref 80.0–100.0)
Monocytes Absolute: 0.4 10*3/uL (ref 0.1–1.0)
Monocytes Relative: 4 %
Neutro Abs: 7 10*3/uL (ref 1.7–7.7)
Neutrophils Relative %: 80 %
Platelet Count: 318 10*3/uL (ref 150–400)
RBC: 3.31 MIL/uL — ABNORMAL LOW (ref 4.22–5.81)
RDW: 18 % — ABNORMAL HIGH (ref 11.5–15.5)
WBC Count: 8.7 10*3/uL (ref 4.0–10.5)
nRBC: 0.9 % — ABNORMAL HIGH (ref 0.0–0.2)

## 2022-07-14 LAB — RAD ONC ARIA SESSION SUMMARY
Course Elapsed Days: 1
Plan Fractions Treated to Date: 2
Plan Prescribed Dose Per Fraction: 3 Gy
Plan Total Fractions Prescribed: 9
Plan Total Prescribed Dose: 27 Gy
Reference Point Dosage Given to Date: 6 Gy
Reference Point Session Dosage Given: 3 Gy
Session Number: 2

## 2022-07-14 MED ORDER — POMALIDOMIDE 4 MG PO CAPS: 4.0000 mg | ORAL_CAPSULE | ORAL | 0 refills | Status: DC

## 2022-07-14 NOTE — Progress Notes (Signed)
[place] [phone]   [phone]  PROGRESS NOTE  Patient Care Team: [name], MD as PCP - General (Internal Medicine) Fay Records, MD as PCP - Cardiology (Cardiology) Sueanne Margarita, MD as PCP - Sleep Medicine (Cardiology) Vickie Epley, MD as PCP - Electrophysiology (Cardiology) Michael Boston, MD as Consulting Physician (General Surgery) Clarene Essex, MD as Consulting Physician (Gastroenterology) Sueanne Margarita, MD as Consulting Physician (Sleep Medicine) Alda Berthold, DO as Consulting Physician (Neurology)  Hematological/Oncological History # IgA Kappa Multiple Myeloma 05/05/2020: Bone marrow biopsy showed increased number of plasma cells representing 14% of all cells in the aspirate associated with numerous variably sized clusters in the clot and biopsy sections 04/10/2021: M protein 0.3, IgA kappa specificity. Kappa 580.5, Labda 7.0, ratio 82.93.   12/07/2021: Labs show of 1971.3, lambda 5.5, ratio 358.42 12/10/2021: Transition care to Dr. Lorenso Courier. 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  01/26/2022: Cycle 2 Day 1 of Dara/Rev/Dex 02/22/2022: Cycle 3 Day 1 of Dara/Rev/Dex 03/23/2022: Cycle 4 Day 1 of Dara/Rev/Dex 04/20/2022: Cycle 5 Day 1 of Dara/Rev/Dex 05/18/2022: Cycle 6 Day 1 of Dara/Rev/Dex 05/27/2022: MRI cervical/thoracic/lumbar: pathologic fracture at T2 with significant osseous retropulsion and resulting cord compression. Additional smaller lesions within T1 vertebral body, right C7 and T3 articulating facets.  Small Multiple Myeloma lesions scattered at all lumbar levels and in the visible left sacral ala. Largest lumbar tumor is 2.3 cm in the left L1 posterior elements and pedicle. No lumbar extraosseous or epidural tumor extension, or pathologic fracture.Underlying chronic lumbar spine  degeneration, with a new central disc herniation at L3-L4 since 2021 now resulting in mild to moderate degenerative spinal stenosis there. 05/28/2022-06/09/2022: Admitted for T2 pathologic fracture and cord compression. He received urgent radiation therapy for a total of 3.0 Gy. He underwent  thoracic 1 through 4 posterior lateral arthrodesis/fusion and 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  06/23/2022: Cycle 1 Day 1 of Velcade/Pomalyst/Dex  Interval History:  [name] 72 y.o. male with medical history significant for IgA Kappa Multiple Myeloma who presents for a follow up visit. He was last seen on 06/23/2022. In the interim, he was hospitalized for due to acute hypoxic respiratory failure due to pneumonia and A fib with RVR. He presents today with his wife.   On exam today Mr. Uselman reports he is feeling better since hospital discharged. He does have a mild residual dry cough but no shortness of breath or fevers. He continues to ambulate with the walker. He does have some soreness in his upper pack near the surgical site. He reports the pain is 3 out 10 on a pain scale and takes tramadol at night. He denies nausea, vomiting or abdominal pain. His bowel habits are unchanged without recurrent episodes of diarrhea or constipation. 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 He denies any worsening neuropathy. He denies easy bruising or signs of active bleeding. He denies any fevers, chills, sweats, shortness of breath, chest pain.  A full 10 point ROS is listed below.  Overall he is willing and able to proceed with treatment today.  MEDICAL HISTORY:  Past Medical History:  Diagnosis Date   A-fib [facility])    Arthritis    "comes and goes; mostly in hands, occasionally elbow" (0000000)   Complication of anesthesia    "just  wanted to sleep alot  for couple days S/P VARICOCELE OR" (04/05/2017)   DVT (deep venous thrombosis) (HCC)    LLE    Dysrhythmia    PVCs    GERD (gastroesophageal reflux disease)    History of hiatal hernia    Hypertension    Impaired glucose tolerance    Multiple myeloma (HCC)    Obesity (BMI 30-39.9) 07/26/2016   OSA on CPAP 07/26/2016   Mild with AHI 12.5/hr now on CPAP at 14cm H2O   PAF (paroxysmal atrial fibrillation) ([place])    s/p PVI Ablation in 2018 // Flecainide Rx // Echo 8/21: EF 60-65, no RWMA, mild LVH, normal RV SF, moderate RVE, mild BAE, trivial MR, mild dilation of aortic root (40 mm)   Pneumonia    "may have had walking pneumonia a few years ago" (04/05/2017)   Pre-diabetes    Thrombophlebitis     SURGICAL HISTORY: Past Surgical History:  Procedure Laterality Date   ATRIAL FIBRILLATION ABLATION  04/05/2017   ATRIAL FIBRILLATION ABLATION N/A 04/05/2017   Procedure: ATRIAL FIBRILLATION ABLATION;  Surgeon: Thompson Grayer, MD;  Location: [facility];  Service: Cardiovascular;  Laterality: N/A;   BONE BIOPSY Right 12/17/2021   Procedure: BONE BIOPSY;  Surgeon: Hiram Gash, MD;  Location: [place];  Service: Orthopedics;  Laterality: Right;   COMPLETE RIGHT HIP REPLACMENT  01/19/2019   EVALUATION UNDER ANESTHESIA WITH HEMORRHOIDECTOMY N/A 02/24/2017   Procedure: EXAM UNDER ANESTHESIA WITH  HEMORRHOIDECTOMY;  Surgeon: Michael Boston, MD;  Location: [facility];  Service: General;  Laterality: N/A;   EXCISIONAL TOTAL HIP ARTHROPLASTY WITH ANTIBIOTIC SPACERS Right 09/25/2020   Procedure: EXCISIONAL TOTAL HIP ARTHROPLASTY WITH ANTIBIOTIC SPACERS;  Surgeon: Paralee Cancel, MD;  Location: [facility];  Service: Orthopedics;  Laterality: Right;   FLEXIBLE SIGMOIDOSCOPY N/A 04/15/2015   Procedure:  UNSEDATED FLEXIBLE SIGMOIDOSCOPY;  Surgeon: [name], MD;  Location: [facility];  Service: Endoscopy;  Laterality: N/A;   HUMERUS IM NAIL Right 12/17/2021   Procedure: INTRAMEDULLARY (IM) NAIL HUMERAL;  Surgeon: Hiram Gash, MD;  Location: [place];  Service:  Orthopedics;  Laterality: Right;   KNEE ARTHROSCOPY Left ~ 2016   POSTERIOR CERVICAL FUSION/FORAMINOTOMY N/A 06/04/2022   Procedure: Thoracic one - Thoracic Four Posterior lateral arthrodesis/ fusion and Thoracic two Corpectomy;  Surgeon: Ashok Pall, MD;  Location: [place];  Service: Neurosurgery;  Laterality: N/A;   REIMPLANTATION OF TOTAL HIP Right 12/25/2020   Procedure: REIMPLANTATION/REVISION OF RIGHT TOTAL HIP VERSUS REPEAT IRRIGATION AND DEBRIDEMENT;  Surgeon: Paralee Cancel, MD;  Location: [facility];  Service: Orthopedics;  Laterality: Right;   TESTICLE SURGERY  1988   VARICOCELE   TONSILLECTOMY     VARICOSE VEIN SURGERY Bilateral     SOCIAL HISTORY: Social History   Socioeconomic History   Marital status: Married    Spouse name: Not on file   Number of children: 1   Years of education: law school   Highest education level: Not on file  Occupational History   Occupation: lawyer  Tobacco Use   Smoking status: Never   Smokeless tobacco: Never  Vaping Use   Vaping Use: Never used  Substance and Sexual Activity   Alcohol use: Yes    Alcohol/week: 2.0 standard drinks of alcohol    Types: 2 Glasses of wine per week    Comment: 2 glasses of wine a week, occ bourbon, beer   Drug use: No   Sexual activity: Not Currently  Other Topics Concern   Not on file  Social History Narrative   Lives in [place] with spouse in a 2 story home.  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    Social Determinants of Health   Financial Resource Strain: Not on file  Food Insecurity: No Food Insecurity (06/30/2022)   Hunger Vital Sign    Worried About Running Out of Food in the Last Year: Never true    Ran Out of Food in the Last Year: Never true  Transportation Needs: No Transportation Needs (06/30/2022)   PRAPARE - Hydrologist (Medical): No    Lack of Transportation (Non-Medical): No  Physical Activity:  Not on file  Stress: Not on file  Social Connections: Not on file  Intimate Partner Violence: Not At Risk (06/30/2022)   Humiliation, Afraid, Rape, and Kick questionnaire    Fear of Current or Ex-Partner: No    Emotionally Abused: No    Physically Abused: No    Sexually Abused: No    FAMILY HISTORY: Family History  Problem Relation Age of Onset   Dementia Mother    Stroke Father    Atrial fibrillation Father    Hypertension Father    Hypertension Paternal Grandfather    Dementia Maternal Grandmother     ALLERGIES:  is allergic to linezolid and vancomycin.  MEDICATIONS:  Current Outpatient Medications  Medication Sig Dispense Refill   acetaminophen (TYLENOL) 325 MG tablet Take 2 tablets (650 mg total) by mouth every 6 (six) hours as needed. (Patient taking differently: Take 650 mg by mouth every 6 (six) hours as needed for mild pain or headache.) 36 tablet 0   acyclovir (ZOVIRAX) 400 MG tablet Take 1 tablet (400 mg total) by mouth 2 (two) times daily. 180 tablet 3   amiodarone (PACERONE) 200 MG tablet Take 1 tablet (200 mg total) by mouth daily. Start after finishing 40 mg twice daily dose of  days 180 tablet 1   apixaban (ELIQUIS) 5 MG TABS tablet Take 1 tablet (5 mg total) by mouth 2 (two) times daily. 60 tablet 11   cholecalciferol (VITAMIN D3) 25 MCG (1000 UNIT) tablet Take 1,000 Units by mouth daily.     Cyanocobalamin (VITAMIN B-12 PO) Take 1,000 mcg by mouth daily.     dexamethasone (DECADRON) 4 MG tablet Take 4 mg by mouth See admin instructions. Take 4 mg by mouth once a day for 3 days after chemo on Wednesday- or unless otherwise instructed     fluticasone (FLONASE) 50 MCG/ACT nasal spray Place 1 spray into both nostrils daily as needed for allergies or rhinitis.     folic acid (FOLVITE) 1 MG tablet Take 1 mg by mouth daily.     furosemide (LASIX) 40 MG tablet Take 40 mg by mouth daily as needed for edema.     gabapentin (NEURONTIN) 300 MG capsule TAKE TWO CAPSULES BY  MOUTH TWICE A DAY (Patient taking differently: Take 600 mg by mouth 2 (two) times daily.) 360 capsule 1   hydrALAZINE (APRESOLINE) 25 MG tablet Take 1 tablet (25 mg total) by mouth every 8 (eight) hours. 90 tablet 0   HYDROcodone-acetaminophen (NORCO) 7.5-325 MG tablet Take 1 tablet by mouth every 6 (six) hours as needed for moderate pain.     lisinopril (ZESTRIL) 40 MG tablet TAKE ONE TABLET BY MOUTH ONE TIME DAILY (Patient taking differently: Take 40 mg by  mouth daily.) 90 tablet 3   methocarbamol (ROBAXIN) 750 MG tablet Take 750 mg by mouth every 8 (eight) hours as needed for muscle spasms.     metoprolol tartrate (LOPRESSOR) 50 MG tablet Take 1 tablet (50 mg total) by mouth 2 (two) times daily. 60 tablet 0   pomalidomide (POMALYST) 4 MG capsule Take 4 mg by mouth See admin instructions. Take 4 mg by mouth once a day for 21 days "on," then 7 days "off." Repeat every 28 days.     potassium chloride SA (KLOR-CON M) 20 MEQ tablet Take 2 tablets (40 mEq total) by mouth daily. 10 tablet 0   predniSONE (DELTASONE) 20 MG tablet Take 1 tablet (20 mg total) by mouth daily with breakfast for 5 days. 5 tablet 0   sodium chloride (OCEAN) 0.65 % SOLN nasal spray Place 1 spray into both nostrils as needed for congestion.     traMADol (ULTRAM) 50 MG tablet Take 1 tablet (50 mg total) by mouth every 6 (six) hours as needed. (Patient taking differently: Take 50 mg by mouth every 6 (six) hours as needed for moderate pain.) 30 tablet 0   amiodarone (PACERONE) 400 MG tablet Take 1 tablet (400 mg total) by mouth 2 (two) times daily for 4 days. After that take 200 mg daily home dose 8 tablet 0   Omega-3 Fatty Acids (FISH OIL PO) Take 1 Capful by mouth daily. (Patient not taking: Reported on 06/30/2022)     No current facility-administered medications for this visit.    REVIEW OF SYSTEMS:   Constitutional: ( - ) fevers, ( - )  chills , ( - ) night sweats Eyes: ( - ) blurriness of vision, ( - ) double vision, ( - )  watery eyes Ears, nose, mouth, throat, and face: ( - ) mucositis, ( - ) sore throat Respiratory: ( - ) cough, ( - ) dyspnea, ( - ) wheezes Cardiovascular: ( - ) palpitation, ( - ) chest discomfort, ( - ) lower extremity swelling Gastrointestinal:  ( - ) nausea, ( - ) heartburn, ( - ) change in bowel habits Skin: ( - ) abnormal skin rashes Lymphatics: ( - ) new lymphadenopathy, ( - ) easy bruising Neurological: ( - ) numbness, ( - ) tingling, ( - ) new weaknesses Behavioral/Psych: ( - ) mood change, ( - ) new changes  All other systems were reviewed with the patient and are negative.  PHYSICAL EXAMINATION: ECOG PERFORMANCE STATUS: 2 - Symptomatic, <50% confined to bed  Vitals:   07/14/22 1300  BP: 131/81  Pulse: (!) 124  Resp: 14  Temp: (!) 97 F (36.1 C)  SpO2: 93%    Filed Weights   07/14/22 1300  Weight: 239 lb 1.6 oz (108.5 kg)     GENERAL: Well-appearing elderly Caucasian male, alert, no distress and comfortable SKIN: no overt abnormalities of the skin.  EYES: conjunctiva are pink and non-injected, sclera clear LUNGS: clear to auscultation and percussion with normal breathing effort HEART: tachycardic &  no murmurs. + improving left lower extremity edema Musculoskeletal: no cyanosis of digits and no clubbing  PSYCH: alert & oriented x 3, fluent speech NEURO: no focal motor/sensory deficits  LABORATORY DATA:  I have reviewed the data as listed    Latest Ref Rng & Units 07/14/2022   12:41 PM 07/03/2022    2:57 AM 06/30/2022    4:02 AM  CBC  WBC 4.0 - 10.5 K/uL 8.7  11.7  7.6  Hemoglobin 13.0 - 17.0 g/dL 10.9  9.8  10.0   Hematocrit 39.0 - 52.0 % 33.3  29.8  29.7   Platelets 150 - 400 K/uL 318  237  211        Latest Ref Rng & Units 07/14/2022   12:41 PM 07/06/2022    1:33 PM 07/06/2022    6:20 AM  CMP  Glucose 70 - 99 mg/dL 103   103   BUN 8 - 23 mg/dL 21   21   Creatinine 0.61 - 1.24 mg/dL 0.90   0.76   Sodium 135 - 145 mmol/L 140   135   Potassium 3.5 - 5.1  mmol/L 4.1  4.3  3.2   Chloride 98 - 111 mmol/L 105   102   CO2 22 - 32 mmol/L 29   26   Calcium 8.9 - 10.3 mg/dL 8.7   7.7   Total Protein 6.5 - 8.1 g/dL 5.9     Total Bilirubin 0.3 - 1.2 mg/dL 0.4     Alkaline Phos 38 - 126 U/L 113     AST 15 - 41 U/L 14     ALT 0 - 44 U/L 32       Lab Results  Component Value Date   MPROTEIN Not Observed 06/23/2022   MPROTEIN 0.1 (H) 05/27/2022   MPROTEIN 0.1 (H) 05/11/2022   Lab Results  Component Value Date   KPAFRELGTCHN 138.1 (H) 06/23/2022   KPAFRELGTCHN 233.8 (H) 05/27/2022   KPAFRELGTCHN 197.9 (H) 05/11/2022   LAMBDASER 3.4 (L) 06/23/2022   LAMBDASER 2.5 (L) 05/27/2022   LAMBDASER 2.6 (L) 05/11/2022   KAPLAMBRATIO 40.62 (H) 06/23/2022   KAPLAMBRATIO 93.52 (H) 05/27/2022   KAPLAMBRATIO 76.12 (H) 05/11/2022     RADIOGRAPHIC STUDIES: DG CHEST PORT 1 VIEW  Result Date: 07/01/2022 CLINICAL DATA:  Hypoxia EXAM: PORTABLE CHEST 1 VIEW COMPARISON:  Portable exam 1054 hours compared to 06/29/2022 FINDINGS: Normal heart size mediastinal contours. Extensive BILATERAL patchy airspace infiltrates consistent with multifocal pneumonia, little changed. No pleural effusion or pneumothorax. Bones demineralized with evidence of prior cervicothoracic fusion and proximal RIGHT humeral nailing. IMPRESSION: Patchy BILATERAL pulmonary infiltrates consistent with multifocal pneumonia, little changed. Electronically Signed   By: Lavonia Dana M.D.   On: 07/01/2022 11:57   CT T-SPINE NO CHARGE  Result Date: 06/29/2022 CLINICAL DATA:  Hypoxia, dyspnea, multiple myeloma EXAM: CT THORACIC SPINE WITHOUT CONTRAST TECHNIQUE: Multidetector CT images of the thoracic were obtained using the standard protocol without intravenous contrast. RADIATION DOSE REDUCTION: This exam was performed according to the departmental dose-optimization program which includes automated exposure control, adjustment of the mA and/or kV according to patient size and/or use of iterative  reconstruction technique. COMPARISON:  06/03/2022, 05/29/2022 FINDINGS: Alignment: Exaggerated kyphosis centered at the T2 level related to prior corpectomy, spacer placement, and T1-T4 posterior fusion. Otherwise alignment is anatomic. Vertebrae: There are stable lytic lesions within the T8 and T10 vertebral bodies, T11 spinous process, and left L1 and L2 pedicles compatible with known multiple myeloma. Expansile lytic lesions within the right posterior sixth and eighth ribs are stable. There are no acute displaced fractures. Interval T2 corpectomy with spacer placement, as well as posterior fusion spanning T1-T4. Orthopedic hardware is unremarkable. Paraspinal and other soft tissues: Widespread bilateral airspace disease and small bilateral pleural effusions are noted. Mediastinal and hilar lymphadenopathy. Paraspinal soft tissues are otherwise unremarkable. Disc levels: There is no significant bony encroachment upon the central canal or neural foramina. Disc spaces are relatively well  preserved. IMPRESSION: 1. Stable lytic lesions within the thoracolumbar spine and thoracic cage, consistent with known multiple myeloma. 2. Interval postsurgical changes from T2 corpectomy, spacer placement, and T1-T4 posterior fusion. 3. No acute fractures. 4. Please see separately reported CT chest exam, describing multifocal bilateral pneumonia. Electronically Signed   By: Randa Ngo M.D.   On: 06/29/2022 16:51   CT Angio Chest PE W/Cm &/Or Wo Cm  Result Date: 06/29/2022 CLINICAL DATA:  Dyspnea, hypoxia, recently diagnosed DVT, history of multiple myeloma EXAM: CT ANGIOGRAPHY CHEST WITH CONTRAST TECHNIQUE: Multidetector CT imaging of the chest was performed using the standard protocol during bolus administration of intravenous contrast. Multiplanar CT image reconstructions and MIPs were obtained to evaluate the vascular anatomy. RADIATION DOSE REDUCTION: This exam was performed according to the departmental  dose-optimization program which includes automated exposure control, adjustment of the mA and/or kV according to patient size and/or use of iterative reconstruction technique. CONTRAST:  39m OMNIPAQUE IOHEXOL 350 MG/ML SOLN COMPARISON:  03/15/2022, 06/03/2022 FINDINGS: Cardiovascular: This is a technically adequate evaluation of the pulmonary vasculature. No filling defects or pulmonary emboli. Mild cardiomegaly without pericardial effusion. No evidence of thoracic aortic aneurysm or dissection. Mediastinum/Nodes: Mediastinal and hilar adenopathy identified. Index lymph node in the subcarinal region measures 16 mm. Thyroid, trachea, and esophagus are unremarkable. Lungs/Pleura: The widespread bilateral airspace disease seen on earlier chest x-ray is again noted, most pronounced within the right upper lobe. There are small bilateral pleural effusions, right greater than left. No pneumothorax. The central airways are patent. Upper Abdomen: No acute abnormality. Musculoskeletal: Lytic lesions within the right posterior sixth rib, T11 spinous process, and left L1 pedicle are stable, consistent with known multiple myeloma. Interval corpectomy and spacer placement at the T2 level, with posterior fusion spanning T1 through T4. Reconstructed images demonstrate no additional findings. Review of the MIP images confirms the above findings. IMPRESSION: 1. No evidence of pulmonary embolus. 2. Multifocal bilateral airspace disease consistent with bilateral pneumonia. 3. Small bilateral pleural effusions. 4. Mediastinal and hilar adenopathy, likely reactive. 5. Stable lytic lesions within the spine and thoracic cage consistent with known multiple myeloma. 6. Interval postsurgical changes at the T2 level from prior corpectomy and spacer placement. Electronically Signed   By: MRanda NgoM.D.   On: 06/29/2022 16:47   DG Chest 2 View  Result Date: 06/29/2022 CLINICAL DATA:  Shortness of breath beginning this morning. EXAM:  CHEST - 2 VIEW COMPARISON:  Chest radiographs 06/21/2022 in 03/10/2022 FINDINGS: Cardiac silhouette and mediastinal contours are partially obscured by overlying lung opacities are grossly unremarkable. There are dense heterogeneous airspace opacities which in the right mid lung and left mid and lower lung that are new from 06/21/2022 most recent comparison. Minimal posterior costophrenic angle blunting on lateral view, tiny pleural effusion at least on the left side and possibly the right side. No pneumothorax is seen. Moderate multilevel degenerative disc changes of the thoracic spine. Lower cervical and upper thoracic partially visualized rod and screw fusion hardware there is again noted. IMPRESSION: Dense heterogeneous airspace opacities which in the right mid lung and left mid and lower lung that are new from 06/21/2022 most recent comparison. Findings are suspicious for multifocal pneumonia. Electronically Signed   By: RYvonne KendallM.D.   On: 06/29/2022 14:59   ECHOCARDIOGRAM COMPLETE  Result Date: 06/22/2022    ECHOCARDIOGRAM REPORT   Patient Name[facility]   [facility]: [facility] Medical Rec #:  [id number]  Height:       70.0 in Accession #:    [id number]       Weight:       244.3 lb Date of Birth:  1950/07/07       BSA:          2.272 m Patient Age:    72 years         BP:           132/74 mmHg Patient Gender: M                HR:           65 bpm. Exam Location:  Inpatient Procedure: 2D Echo, Color Doppler, Cardiac Doppler and Intracardiac            Opacification Agent Indications:    I48.91* Unspeicified atrial fibrillation  History:        Patient has prior history of Echocardiogram examinations, most                 recent 09/01/2021. Abnormal ECG, Arrythmias:Atrial Fibrillation,                 Signs/Symptoms:Chest Pain and Edema; Risk Factors:Hypertension                 and Sleep Apnea. Chemotherapy. Myeloma.  Sonographer:    Roseanna Rainbow RDCS Referring Phys: [id number] MIR M [facility]   Sonographer Comments: Technically difficult study due to poor echo windows and patient is obese. Image acquisition challenging due to patient body habitus. Patient in fowler's position. Patient post spinal surgery 2/2. IMPRESSIONS  1. Appears to be in sinus rhythm at the time of evaluation- A waves present on diastology. Left ventricular ejection fraction, by estimation, is 60 to 65%. The left ventricle has normal function. The left ventricle has no regional wall motion abnormalities. There is mild concentric left ventricular hypertrophy. Left ventricular diastolic parameters are consistent with Grade II diastolic dysfunction (pseudonormalization).  2. Right ventricular systolic function is normal. The right ventricular size is normal. There is normal pulmonary artery systolic pressure.  3. The mitral valve is grossly normal. No evidence of mitral valve regurgitation.  4. The aortic valve is tricuspid. Aortic valve regurgitation is not visualized. No aortic stenosis is present.  5. The inferior vena cava is normal in size with greater than 50% respiratory variability, suggesting right atrial pressure of 3 mmHg. FINDINGS  Left Ventricle: Appears to be in sinus rhythm at the time of evaluation- A waves present on diastology. Left ventricular ejection fraction, by estimation, is 60 to 65%. The left ventricle has normal function. The left ventricle has no regional wall motion abnormalities. Definity contrast agent was given IV to delineate the left ventricular endocardial borders. The left ventricular internal cavity size was normal in size. There is mild concentric left ventricular hypertrophy. Left ventricular diastolic parameters are consistent with Grade II diastolic dysfunction (pseudonormalization). Right Ventricle: The right ventricular size is normal. No increase in right ventricular wall thickness. Right ventricular systolic function is normal. There is normal pulmonary artery systolic pressure. The tricuspid  regurgitant velocity is 2.33 m/s, and  with an assumed right atrial pressure of 3 mmHg, the estimated right ventricular systolic pressure is [id number] mmHg. Left Atrium: Left atrial size was normal in size. Right Atrium: Right atrial size was normal in size. Pericardium: There is no evidence of pericardial effusion. Mitral Valve: The mitral valve is grossly normal. No evidence of mitral valve regurgitation. Tricuspid Valve: The  tricuspid valve is normal in structure. Tricuspid valve regurgitation is not demonstrated. No evidence of tricuspid stenosis. Aortic Valve: The aortic valve is tricuspid. Aortic valve regurgitation is not visualized. No aortic stenosis is present. Pulmonic Valve: The pulmonic valve was grossly normal. Pulmonic valve regurgitation is not visualized. Aorta: The aortic root and ascending aorta are structurally normal, with no evidence of dilitation. Venous: The inferior vena cava is normal in size with greater than 50% respiratory variability, suggesting right atrial pressure of 3 mmHg. IAS/Shunts: No atrial level shunt detected by color flow Doppler.  LEFT VENTRICLE PLAX 2D LVIDd:         5.20 cm   Diastology LVIDs:         3.30 cm   LV e' medial:    6.53 cm/s LV PW:         1.20 cm   LV E/e' medial:  15.5 LV IVS:        1.20 cm   LV e' lateral:   11.60 cm/s LVOT diam:     2.60 cm   LV E/e' lateral: 8.7 LV SV:         133 LV SV Index:   58 LVOT Area:     5.31 cm  RIGHT VENTRICLE             IVC RV S prime:     18.80 cm/s  IVC diam: 2.60 cm TAPSE (M-mode): 3.0 cm LEFT ATRIUM             Index        RIGHT ATRIUM           Index LA diam:        4.30 cm 1.89 cm/m   RA Area:     20.90 cm LA Vol (A2C):   84.9 ml 37.36 ml/m  RA Volume:   60.10 ml  26.45 ml/m LA Vol (A4C):   66.4 ml 29.22 ml/m LA Biplane Vol: 74.9 ml 32.96 ml/m  AORTIC VALVE LVOT Vmax:   127.00 cm/s LVOT Vmean:  84.000 cm/s LVOT VTI:    0.250 m  AORTA Ao Root diam: 3.70 cm Ao Asc diam:  3.70 cm MITRAL VALVE                TRICUSPID  VALVE MV Area (PHT): 4.21 cm     TR Peak grad:   21.7 mmHg MV Decel Time: 180 msec     TR Vmax:        233.00 cm/s MV E velocity: 101.00 cm/s MV A velocity: 54.00 cm/s   SHUNTS MV E/A ratio:  1.87         Systemic VTI:  0.25 m                             Systemic Diam: 2.60 cm Rudean Haskell MD Electronically signed by Rudean Haskell MD Signature Date/Time: 06/22/2022/9:37:19 AM    Final    DG Thoracic Spine 2 View  Result Date: 06/21/2022 CLINICAL DATA:  Trauma EXAM: THORACIC SPINE 2 VIEWS COMPARISON:  Cone beam CT 06/04/22 FINDINGS: Assessment is limited due to patient positioning. Post surgical changes from fusion at the cervicothoracic junction. Hardware is intact with unchanged alignment compared to immediate post-operative cone-beam CT. There is no evidence of thoracic spine fracture. No other significant bone abnormalities are identified. IMPRESSION: Limited assessment due to patient positioning. Within this limitation - no evidence of thoracic spine fracture. If  there is high clinical concern fro a cervical or thoracic spine injury, further evaluation with CT is recommended. Electronically Signed   By: Marin Roberts M.D.   On: 06/21/2022 10:55   VAS [place] LOWER EXTREMITY VENOUS (DVT) (7a-7p)  Result Date: 06/21/2022  Lower Venous DVT Study Patient Name:  CASTIEL MANWILLER [facility]  Date of Exam:   06/21/2022 Medical Rec #: [id number]         Accession #:    [id number] Date of Birth: Nov 28, 1950        Patient Gender: M Patient Age:   59 years Exam Location:  [facility] Procedure:      VAS [place] LOWER EXTREMITY VENOUS (DVT) Referring Phys: Wille Glaser KNAPP --------------------------------------------------------------------------------  Indications: Asymmetrical swelling x2 weeks LT>RT, s/p thoracic spine surgery 06/04/2022.  Risk Factors: History of DVT. Anticoagulation: Eliquis. Limitations: Poor ultrasound/tissue interface. Comparison Study: No prior studies available. Performing Technologist: Darlin Coco RDMS, RVT  Examination Guidelines: A complete evaluation includes B-mode imaging, spectral Doppler, color Doppler, and power Doppler as needed of all accessible portions of each vessel. Bilateral testing is considered an integral part of a complete examination. Limited examinations for reoccurring indications may be performed as noted. The reflux portion of the exam is performed with the patient in reverse Trendelenburg.  +-----+---------------+---------+-----------+----------+--------------+ RIGHTCompressibilityPhasicitySpontaneityPropertiesThrombus Aging +-----+---------------+---------+-----------+----------+--------------+ CFV  Full           Yes      Yes                                 +-----+---------------+---------+-----------+----------+--------------+   +---------+---------------+---------+-----------+----------+--------------+ LEFT     CompressibilityPhasicitySpontaneityPropertiesThrombus Aging +---------+---------------+---------+-----------+----------+--------------+ CFV      Full           Yes      Yes                                 +---------+---------------+---------+-----------+----------+--------------+ SFJ      Full                                                        +---------+---------------+---------+-----------+----------+--------------+ FV Prox  Full                                                        +---------+---------------+---------+-----------+----------+--------------+ FV Mid   Full                                                        +---------+---------------+---------+-----------+----------+--------------+ FV DistalFull                                                        +---------+---------------+---------+-----------+----------+--------------+ PFV      Full                                                        +---------+---------------+---------+-----------+----------+--------------+  POP      Full            Yes      Yes                                 +---------+---------------+---------+-----------+----------+--------------+ PTV      Full                                                        +---------+---------------+---------+-----------+----------+--------------+ PERO     Partial        No       No                   Acute          +---------+---------------+---------+-----------+----------+--------------+     Summary: RIGHT: - No evidence of common femoral vein obstruction.  LEFT: - Findings consistent with acute deep vein thrombosis involving the left peroneal veins. - No cystic structure found in the popliteal fossa.  *See table(s) above for measurements and observations. Electronically signed by Servando Snare MD on 06/21/2022 at 10:46:28 AM.    Final    DG Chest 2 View  Result Date: 06/21/2022 CLINICAL DATA:  Weakness EXAM: CHEST - 2 VIEW COMPARISON:  CXR 03/10/22 FINDINGS: Assessment of the bilateral lung apices limited due to patient positioning/chin down technique. No pleural effusion. No pneumothorax. No focal airspace opacity. Unchanged cardiac and mediastinal contours. Visualized upper abdomen is unremarkable. Possible age indeterminate fracture of the anterior/lateral left sixth rib. Visualized upper abdomen is unremarkable. IMPRESSION: 1. Possible age indeterminate fracture of the anterolateral left sixth rib. Correlate with point tenderness. 2. No focal airspace opacity. Electronically Signed   By: Marin Roberts M.D.   On: 06/21/2022 08:38    ASSESSMENT & PLAN LUDLOW ACHILLE is a 72 y.o. male with medical history significant for IgA Kappa Multiple Myeloma who presents for a follow up visit.   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  He meets diagnostic criteria by having a serum free light chain ratio greater than 100 and lytic lesions causing pathological fracture  of the humerus.  Given this I would recommend we pursued Darzalex, Revlimid, and dexamethasone.  Given his advanced age I would not recommend transplantation, that this is something we can consider when it comes time to transition to maintenance therapy versus transplant.  The patient voices understanding of the plan moving forward.  # IgA Kappa Multiple Myeloma --Metastatic survey completed 12/15/2021 --Patient underwent fixation of his humerus on 12/17/2021, pathology confirmed plasmacytoma/plasma cell neoplasm.  --Received 6 cycles of Darzalex, Revlimid, and dexamethasone started on 12/28/2021-05/18/2022.  --Due to worsening myeloma osseous lesions including pathologic fracture at T2 with cord compression, Dr. Lorenso Courier recommends changing therapy to Velcade/Pomalyst/Dex.  Plan: --Due for Cycle 1, Day 8 of Velcade/Pom/Dex today --labs from today showed white blood cell 8.7, hemoglobin 10.9, MCV 100.6, and platelets of 318 --restaging labs from 06/23/2022 with SPEP and SFLC showed M protein not detectable. Kappa decreased to 138.1 (from 233.8), Lambda 3.4, Ratio 40.62.  --Due to tachycardia with HR of 110-120's in the setting of A fib, we will defer treatment to tomorrow since patient has visit  with Afib clinic tomorrow morning.  --RTC in 2 weeks with interval weekly treatments  #Palliative Radiation Therapy: --Received palliative radiation from 01/06/2022-01/19/2022 to right hymerus, right forearm and right chest/rib, 20 Gy in 10 Fx.  --Started palliative radiation to surgical bed in the upper thoracic spine area yesterday on 07/13/2022. Planned 27 Gy in 9 Fx.   # Rash Concerning for Shingles- improving -- Prescribed valacyclovir 3 times daily 1000 mg x 10 days-completed the course -- We will continue to monitor rash closely.  #DVT: --Found to have acute DVT involving left peroneal veins. Likely secondary to recent surgery and hospitalization --Currently on  5 mg BID.   #Hypokalemia: --Currently  on potassium chloride 40 mEq twice daily as prescribed. Potassium level is normal today.   #Supportive Care -- chemotherapy education complete -- port placement not required -- zofran '8mg'$  q8H PRN and compazine '10mg'$  PO q6H for nausea -- acyclovir '400mg'$  PO BID for VCZ prophylaxis -- Started Zometa q 12 weeks on 05/18/2022. Next dose April 2024.    No orders of the defined types were placed in this encounter.   All questions were answered. The patient knows to call the clinic with any problems, questions or concerns.  A total of more than 30 minutes were spent on this encounter with face-to-face time and non-face-to-face time, including preparing to see the patient, ordering tests and/or medications, counseling the patient and coordination of care as outlined above.   Dede Query PA-C Dept of Hematology and [place] at [facility] Phone: [phone]   07/14/2022 2:42 PM

## 2022-07-15 ENCOUNTER — Other Ambulatory Visit (HOSPITAL_COMMUNITY): Payer: Self-pay

## 2022-07-15 ENCOUNTER — Other Ambulatory Visit: Payer: Self-pay

## 2022-07-15 ENCOUNTER — Ambulatory Visit (HOSPITAL_COMMUNITY)
Admission: RE | Admit: 2022-07-15 | Discharge: 2022-07-15 | Disposition: A | Discharge status: Home / Self Care | Payer: Medicare Other | Source: Ambulatory Visit | Attending: Physician Assistant | Admitting: Physician Assistant

## 2022-07-15 ENCOUNTER — Inpatient Hospital Stay: Payer: Medicare Other

## 2022-07-15 ENCOUNTER — Ambulatory Visit
Admission: RE | Admit: 2022-07-15 | Discharge: 2022-07-15 | Disposition: A | Discharge status: Home / Self Care | Payer: Medicare Other | Source: Ambulatory Visit | Attending: Radiation Oncology | Admitting: Radiation Oncology

## 2022-07-15 VITALS — BP 149/90 | HR 98 | Temp 97.9°F | Resp 18

## 2022-07-15 VITALS — BP 154/84 | HR 104 | Ht 70.0 in | Wt 240.6 lb

## 2022-07-15 DIAGNOSIS — Z8249 Family history of ischemic heart disease and other diseases of the circulatory system: Secondary | ICD-10-CM | POA: Diagnosis not present

## 2022-07-15 DIAGNOSIS — C9000 Multiple myeloma not having achieved remission: Secondary | ICD-10-CM | POA: Insufficient documentation

## 2022-07-15 DIAGNOSIS — D6869 Other thrombophilia: Secondary | ICD-10-CM | POA: Diagnosis not present

## 2022-07-15 DIAGNOSIS — Z86718 Personal history of other venous thrombosis and embolism: Secondary | ICD-10-CM | POA: Diagnosis not present

## 2022-07-15 DIAGNOSIS — Z6834 Body mass index (BMI) 34.0-34.9, adult: Secondary | ICD-10-CM | POA: Diagnosis not present

## 2022-07-15 DIAGNOSIS — G4733 Obstructive sleep apnea (adult) (pediatric): Secondary | ICD-10-CM | POA: Diagnosis not present

## 2022-07-15 DIAGNOSIS — I480 Paroxysmal atrial fibrillation: Secondary | ICD-10-CM | POA: Diagnosis not present

## 2022-07-15 DIAGNOSIS — Z923 Personal history of irradiation: Secondary | ICD-10-CM | POA: Diagnosis not present

## 2022-07-15 DIAGNOSIS — I110 Hypertensive heart disease with heart failure: Secondary | ICD-10-CM | POA: Diagnosis not present

## 2022-07-15 DIAGNOSIS — I5032 Chronic diastolic (congestive) heart failure: Secondary | ICD-10-CM | POA: Insufficient documentation

## 2022-07-15 DIAGNOSIS — I4819 Other persistent atrial fibrillation: Secondary | ICD-10-CM | POA: Insufficient documentation

## 2022-07-15 DIAGNOSIS — Z7901 Long term (current) use of anticoagulants: Secondary | ICD-10-CM | POA: Insufficient documentation

## 2022-07-15 DIAGNOSIS — Z79899 Other long term (current) drug therapy: Secondary | ICD-10-CM | POA: Diagnosis not present

## 2022-07-15 DIAGNOSIS — E876 Hypokalemia: Secondary | ICD-10-CM | POA: Diagnosis not present

## 2022-07-15 DIAGNOSIS — E669 Obesity, unspecified: Secondary | ICD-10-CM | POA: Diagnosis not present

## 2022-07-15 DIAGNOSIS — Z5181 Encounter for therapeutic drug level monitoring: Secondary | ICD-10-CM

## 2022-07-15 DIAGNOSIS — Z510 Encounter for antineoplastic radiation therapy: Secondary | ICD-10-CM | POA: Diagnosis not present

## 2022-07-15 DIAGNOSIS — Z5112 Encounter for antineoplastic immunotherapy: Secondary | ICD-10-CM | POA: Diagnosis not present

## 2022-07-15 DIAGNOSIS — M8458XA Pathological fracture in neoplastic disease, other specified site, initial encounter for fracture: Secondary | ICD-10-CM | POA: Diagnosis not present

## 2022-07-15 LAB — RAD ONC ARIA SESSION SUMMARY
Course Elapsed Days: 2
Plan Fractions Treated to Date: 3
Plan Prescribed Dose Per Fraction: 3 Gy
Plan Total Fractions Prescribed: 9
Plan Total Prescribed Dose: 27 Gy
Reference Point Dosage Given to Date: 9 Gy
Reference Point Session Dosage Given: 3 Gy
Session Number: 3

## 2022-07-15 MED ORDER — BORTEZOMIB CHEMO SQ INJECTION 3.5 MG (2.5MG/ML)
1.3000 mg/m2 | Freq: Once | INTRAMUSCULAR | Status: AC
  Administered 2022-07-15: 3 mg via SUBCUTANEOUS
  Filled 2022-07-15: qty 1.2

## 2022-07-15 NOTE — Progress Notes (Signed)
Ok to proceed with Velcade today per Dr. Lorenso Courier. Pt took Decadron yesterday not need today per Dr. Lorenso Courier

## 2022-07-15 NOTE — Patient Instructions (Signed)
Increase metoprolol to '75mg'$  twice a day until day of cardioversion then reduce back to '50mg'$  twice a day   Cardioversion scheduled for: Tuesday, April 2nd   - Arrive at the [facility] and go to admitting at [facility] not eat or drink anything after midnight the night prior to your procedure.   - Take all your morning medication (except diabetic medications) with a sip of water prior to arrival.  - You will not be able to drive home after your procedure.    - Do NOT miss any doses of your blood thinner - if you should miss a dose please notify our office immediately.   - If you feel as if you go back into normal rhythm prior to scheduled cardioversion, please notify our office immediately.   If your procedure is canceled in the cardioversion suite you will be charged a cancellation fee.

## 2022-07-15 NOTE — Patient Instructions (Signed)
[name]  Discharge Instructions: Thank you for choosing [facility] to provide your oncology and hematology care.   If you have a lab appointment with the [facility], please go directly to the [facility] and check in at the registration area.   Wear comfortable clothing and clothing appropriate for easy access to any Portacath or PICC line.   We strive to give you quality time with your provider. You may need to reschedule your appointment if you arrive late (15 or more minutes).  Arriving late affects you and other patients whose appointments are after yours.  Also, if you miss three or more appointments without notifying the office, you may be dismissed from the clinic at the provider's discretion.      For prescription refill requests, have your pharmacy contact our office and allow 72 hours for refills to be completed.    Today you received the following chemotherapy and/or immunotherapy agents Bortezomib (Velcade)    To help prevent nausea and vomiting after your treatment, we encourage you to take your nausea medication as directed.  BELOW ARE SYMPTOMS THAT SHOULD BE REPORTED IMMEDIATELY: *FEVER GREATER THAN 100.4 F (38 C) OR HIGHER *CHILLS OR SWEATING *NAUSEA AND VOMITING THAT IS NOT CONTROLLED WITH YOUR NAUSEA MEDICATION *UNUSUAL SHORTNESS OF BREATH *UNUSUAL BRUISING OR BLEEDING *URINARY PROBLEMS (pain or burning when urinating, or frequent urination) *BOWEL PROBLEMS (unusual diarrhea, constipation, pain near the anus) TENDERNESS IN MOUTH AND THROAT WITH OR WITHOUT PRESENCE OF ULCERS (sore throat, sores in mouth, or a toothache) UNUSUAL RASH, SWELLING OR PAIN  UNUSUAL VAGINAL DISCHARGE OR ITCHING   Items with * indicate a potential emergency and should be followed up as soon as possible or go to the Emergency Department if any problems should occur.  Please show the CHEMOTHERAPY ALERT CARD or IMMUNOTHERAPY ALERT CARD  at check-in to the Emergency Department and triage nurse.  Should you have questions after your visit or need to cancel or reschedule your appointment, please contact [facility]  Dept: [phone]  and follow the prompts.  Office hours are 8:00 a.m. to 4:30 p.m. Monday - Friday. Please note that voicemails left after 4:00 p.m. may not be returned until the following business day.  We are closed weekends and major holidays. You have access to a nurse at all times for urgent questions. Please call the main number to the clinic Dept: [phone] and follow the prompts.   For any non-urgent questions, you may also contact your provider using MyChart. We now offer e-Visits for anyone 37 and older to request care online for non-urgent symptoms. For details visit [email or web address].   Also download the MyChart app! Go to the app store, search "MyChart", open the app, select [facility], and log in with your MyChart username and password.  Bortezomib Injection What is this medication? BORTEZOMIB (bor TEZ oh mib) treats lymphoma. It may also be used to treat multiple myeloma, a type of bone marrow cancer. It works by blocking a protein that causes cancer cells to grow and multiply. This helps to slow or stop the spread of cancer cells. This medicine may be used for other purposes; ask your health care provider or pharmacist if you have questions. COMMON BRAND NAME(S): Velcade What should I tell my care team before I take this medication? They need to know if you have any of these conditions: Dehydration Diabetes Heart disease Liver disease Tingling of  the fingers or toes or other nerve disorder An unusual or allergic reaction to bortezomib, other medications, foods, dyes, or preservatives If you or your partner are pregnant or trying to get pregnant Breastfeeding How should I use this medication? This medication is injected into a vein or under the skin.  It is given by your care team in a hospital or clinic setting. Talk to your care team about the use of this medication in children. Special care may be needed. Overdosage: If you think you have taken too much of this medicine contact a poison control center or emergency room at once. NOTE: This medicine is only for you. Do not share this medicine with others. What if I miss a dose? Keep appointments for follow-up doses. It is important not to miss your dose. Call your care team if you are unable to keep an appointment. What may interact with this medication? Ketoconazole Rifampin This list may not describe all possible interactions. Give your health care provider a list of all the medicines, herbs, non-prescription drugs, or dietary supplements you use. Also tell them if you smoke, drink alcohol, or use illegal drugs. Some items may interact with your medicine. What should I watch for while using this medication? Your condition will be monitored carefully while you are receiving this medication. You may need blood work while taking this medication. This medication may affect your coordination, reaction time, or judgment. Do not drive or operate machinery until you know how this medication affects you. Sit up or stand slowly to reduce the risk of dizzy or fainting spells. Drinking alcohol with this medication can increase the risk of these side effects. This medication may increase your risk of getting an infection. Call your care team for advice if you get a fever, chills, sore throat, or other symptoms of a cold or flu. Do not treat yourself. Try to avoid being around people who are sick. Check with your care team if you have severe diarrhea, nausea, and vomiting, or if you sweat a lot. The loss of too much body fluid may make it dangerous for you to take this medication. Talk to your care team if you may be pregnant. Serious birth defects can occur if you take this medication during pregnancy and  for 7 months after the last dose. You will need a negative pregnancy test before starting this medication. Contraception is recommended while taking this medication and for 7 months after the last dose. Your care team can help you find the option that works for you. If your partner can get pregnant, use a condom during sex while taking this medication and for 4 months after the last dose. Do not breastfeed while taking this medication and for 2 months after the last dose. This medication may cause infertility. Talk to your care team if you are concerned about your fertility. What side effects may I notice from receiving this medication? Side effects that you should report to your care team as soon as possible: Allergic reactions--skin rash, itching, hives, swelling of the face, lips, tongue, or throat Bleeding--bloody or black, tar-like stools, vomiting blood or brown material that looks like coffee grounds, red or dark brown urine, small red or purple spots on skin, unusual bruising or bleeding Bleeding in the brain--severe headache, stiff neck, confusion, dizziness, change in vision, numbness or weakness of the face, arm, or leg, trouble speaking, trouble walking, vomiting Bowel blockage--stomach cramping, unable to have a bowel movement or pass gas, loss of appetite,  vomiting Heart failure--shortness of breath, swelling of the ankles, feet, or hands, sudden weight gain, unusual weakness or fatigue Infection--fever, chills, cough, sore throat, wounds that don't heal, pain or trouble when passing urine, general feeling of discomfort or being unwell Liver injury--right upper belly pain, loss of appetite, nausea, light-colored stool, dark yellow or brown urine, yellowing skin or eyes, unusual weakness or fatigue Low blood pressure--dizziness, feeling faint or lightheaded, blurry vision Lung injury--shortness of breath or trouble breathing, cough, spitting up blood, chest pain, fever Pain, tingling, or  numbness in the hands or feet Severe or prolonged diarrhea Stomach pain, bloody diarrhea, pale skin, unusual weakness or fatigue, decrease in the amount of urine, which may be signs of hemolytic uremic syndrome Sudden and severe headache, confusion, change in vision, seizures, which may be signs of posterior reversible encephalopathy syndrome (PRES) TTP--purple spots on the skin or inside the mouth, pale skin, yellowing skin or eyes, unusual weakness or fatigue, fever, fast or irregular heartbeat, confusion, change in vision, trouble speaking, trouble walking Tumor lysis syndrome (TLS)--nausea, vomiting, diarrhea, decrease in the amount of urine, dark urine, unusual weakness or fatigue, confusion, muscle pain or cramps, fast or irregular heartbeat, joint pain Side effects that usually do not require medical attention (report to your care team if they continue or are bothersome): Constipation Diarrhea Fatigue Loss of appetite Nausea This list may not describe all possible side effects. Call your doctor for medical advice about side effects. You may report side effects to FDA at 1-800-FDA-1088. Where should I keep my medication? This medication is given in a hospital or clinic. It will not be stored at home. NOTE: This sheet is a summary. It may not cover all possible information. If you have questions about this medicine, talk to your doctor, pharmacist, or health care provider.  2023 Elsevier/Gold Standard (2021-09-16 00:00:00)

## 2022-07-15 NOTE — Progress Notes (Signed)
Primary Care Physician: [name], MD Primary Cardiologist: Dr Harrington Challenger Primary Electrophysiologist: Dr Quentin Ore Referring Physician: Dr [name] is a 72 y.o. male with a history of HTN, OSA, prior DVT, MGUS, atrial fibrillation who presents for follow up in the [facility].  The patient underwent afib ablation in 2018 with Dr Rayann Heman and has been maintained on flecainide. Patient is on Eliquis for a CHADS2VASC score of 2. Patient has been undergoing treatment for multiple myloma, finishing radiation but continuing chemotherapy.   He is now in the office as he went to [facility] over the weekend and became presyncopal and found out he was afib with RVR. He was also fluid overloaded with 12 lbs of fluid . He was  admitted 10/28 and   thru Sunday 10/29. Pt states he was diuresed 12 lbs of fluid. He was not aware that he was fluid overloaded. He had his last chemo 10/17. He had BB stopped as he was slightly lightheaded and noted a HR in the 40's. He was placed back on metoprolol 25 mg tid and was d/c in rate controlled afib. He did return to SR on Monday and decreased metoprolol to bid at that point. He is in sinus brady at 55 bpm. He has continued on eliquis 5 mg bid. He is interested in establishing with another EP in Dr. Jackalyn Lombard absence to discuss ablation. Echo was repeated there w/o significant findings.   Mr. Bertani is now back in afib clinic after having a ED visit, 03/15/22 for afib associated with atypical chest pain 03/15/22 but EKG shows Sinus brady in the ED. No acute findings were found in the ED but CT angio of  the chest to r/o PE did show Lytic lesions of the posterior rt sixth rib as well as several lytic lesions in the lower thoracic spine. He is still having issues with pain under left axilla and into left shoulder blade. He was given a lidocaine patch and used this last night and this did help some.  Feels the discomfort more with sitting  and lying.    EKg today shows SR. He will continue with flecainide 100 mg bid and BB was increased to 50 mg bid. He does not feel as well with this dose with HR's in the low 50's and upper 40's. He wants to go back to lopressor 25 mg tid. Now with pt thinking back, he has had wine for the last 3 breakthrough afib episodes that he has had. He is going to avoid alcohol.   Follow up in the AF clinic 07/15/22. Patient was in his usual state of health until Tuesday, 06/29/2022 when he started noticing worsening dyspnea. His PT performed a pulse oximetry on him and found him to be hypoxic at 84% on room air. He never required any oxygen before. EMS was called and placed the patient on 3 L nasal cannula. CTA of the chest showed no evidence of PE, however did reveal multifocal bilateral pneumonia, small bilateral pleural effusion, stable lytic lesion within the spine and the thoracic cage consistent with known multiple myeloma. On arrival, patient was in A-fib/atrial flutter with RVR. Also found to be in acute CHF. He was reloaded on IV amiodarone and diuresed. Discharged in rate controlled afib. He has noticed that his heart rate has been between 90-125 bpm at home.   Today, he denies symptoms of chest pain, shortness of breath, orthopnea, PND, lower extremity edema, dizziness, presyncope,  syncope, bleeding, or neurologic sequela. The patient is tolerating medications without difficulties and is otherwise without complaint today.    Atrial Fibrillation Risk Factors:  he does have symptoms or diagnosis of sleep apnea. he is compliant with CPAP therapy. he does not have a history of rheumatic fever. he does have a history of alcohol use.   he has a BMI of Body mass index is 34.52 kg/m.[name] Filed Weights   07/15/22 1041  Weight: 109.1 kg    Family History  Problem Relation Age of Onset   Dementia Mother    Stroke Father    Atrial fibrillation Father    Hypertension Father    Hypertension Paternal  Grandfather    Dementia Maternal Grandmother      Atrial Fibrillation Management history:  Previous antiarrhythmic drugs: flecainide, amiodarone  Previous cardioversions: none Previous ablations: 2018 CHADS2VASC score: 3 Anticoagulation history: Eliquis   Past Medical History:  Diagnosis Date   A-fib ([place])    Arthritis    "comes and goes; mostly in hands, occasionally elbow" (0000000)   Complication of anesthesia    "just wanted to sleep alot  for couple days S/P VARICOCELE OR" (04/05/2017)   DVT (deep venous thrombosis) ([facility])    LLE   Dysrhythmia    PVCs    GERD (gastroesophageal reflux disease)    History of hiatal hernia    Hypertension    Impaired glucose tolerance    Multiple myeloma ([facility])    Obesity (BMI 30-39.9) 07/26/2016   OSA on CPAP 07/26/2016   Mild with AHI 12.5/hr now on CPAP at 14cm H2O   PAF (paroxysmal atrial fibrillation) ([place])    s/p PVI Ablation in 2018 // Flecainide Rx // Echo 8/21: EF 60-65, no RWMA, mild LVH, normal RV SF, moderate RVE, mild BAE, trivial MR, mild dilation of aortic root (40 mm)   Pneumonia    "may have had walking pneumonia a few years ago" (04/05/2017)   Pre-diabetes    Thrombophlebitis    Past Surgical History:  Procedure Laterality Date   ATRIAL FIBRILLATION ABLATION  04/05/2017   ATRIAL FIBRILLATION ABLATION N/A 04/05/2017   Procedure: ATRIAL FIBRILLATION ABLATION;  Surgeon: Thompson Grayer, MD;  Location: [facility];  Service: Cardiovascular;  Laterality: N/A;   BONE BIOPSY Right 12/17/2021   Procedure: BONE BIOPSY;  Surgeon: Hiram Gash, MD;  Location: [facility];  Service: Orthopedics;  Laterality: Right;   COMPLETE RIGHT HIP REPLACMENT  01/19/2019   EVALUATION UNDER ANESTHESIA WITH HEMORRHOIDECTOMY N/A 02/24/2017   Procedure: EXAM UNDER ANESTHESIA WITH  HEMORRHOIDECTOMY;  Surgeon: Michael Boston, MD;  Location: [facility];  Service: General;  Laterality: N/A;   EXCISIONAL TOTAL HIP ARTHROPLASTY WITH  ANTIBIOTIC SPACERS Right 09/25/2020   Procedure: EXCISIONAL TOTAL HIP ARTHROPLASTY WITH ANTIBIOTIC SPACERS;  Surgeon: Paralee Cancel, MD;  Location: [facility];  Service: Orthopedics;  Laterality: Right;   FLEXIBLE SIGMOIDOSCOPY N/A 04/15/2015   Procedure:  UNSEDATED FLEXIBLE SIGMOIDOSCOPY;  Surgeon: [name], MD;  Location: [facility];  Service: Endoscopy;  Laterality: N/A;   HUMERUS IM NAIL Right 12/17/2021   Procedure: INTRAMEDULLARY (IM) NAIL HUMERAL;  Surgeon: Hiram Gash, MD;  Location: [facility];  Service: Orthopedics;  Laterality: Right;   KNEE ARTHROSCOPY Left ~ 2016   POSTERIOR CERVICAL FUSION/FORAMINOTOMY N/A 06/04/2022   Procedure: Thoracic one - Thoracic Four Posterior lateral arthrodesis/ fusion and Thoracic two Corpectomy;  Surgeon: Ashok Pall, MD;  Location: [facility];  Service: Neurosurgery;  Laterality:  N/A;   REIMPLANTATION OF TOTAL HIP Right 12/25/2020   Procedure: REIMPLANTATION/REVISION OF RIGHT TOTAL HIP VERSUS REPEAT IRRIGATION AND DEBRIDEMENT;  Surgeon: Paralee Cancel, MD;  Location: [facility];  Service: Orthopedics;  Laterality: Right;   TESTICLE SURGERY  1988   VARICOCELE   TONSILLECTOMY     VARICOSE VEIN SURGERY Bilateral     Current Outpatient Medications  Medication Sig Dispense Refill   acetaminophen (TYLENOL) 325 MG tablet Take 2 tablets (650 mg total) by mouth every 6 (six) hours as needed. (Patient taking differently: Take 650 mg by mouth every 6 (six) hours as needed for mild pain or headache.) 36 tablet 0   acyclovir (ZOVIRAX) 400 MG tablet Take 1 tablet (400 mg total) by mouth 2 (two) times daily. 180 tablet 3   amiodarone (PACERONE) 200 MG tablet Take 1 tablet (200 mg total) by mouth daily. Start after finishing 40 mg twice daily dose of  days 180 tablet 1   amiodarone (PACERONE) 400 MG tablet Take 1 tablet (400 mg total) by mouth 2 (two) times daily for 4 days. After that take 200 mg daily home dose 8 tablet 0   apixaban (ELIQUIS) 5 MG TABS  tablet Take 1 tablet (5 mg total) by mouth 2 (two) times daily. 60 tablet 11   cholecalciferol (VITAMIN D3) 25 MCG (1000 UNIT) tablet Take 1,000 Units by mouth daily.     Cyanocobalamin (VITAMIN B-12 PO) Take 1,000 mcg by mouth daily.     dexamethasone (DECADRON) 4 MG tablet Take 4 mg by mouth See admin instructions. Take 4 mg by mouth once a day for 3 days after chemo on Wednesday- or unless otherwise instructed     fluticasone (FLONASE) 50 MCG/ACT nasal spray Place 1 spray into both nostrils daily as needed for allergies or rhinitis.     folic acid (FOLVITE) 1 MG tablet Take 1 mg by mouth daily.     furosemide (LASIX) 40 MG tablet Take 40 mg by mouth daily as needed for edema.     gabapentin (NEURONTIN) 300 MG capsule TAKE TWO CAPSULES BY MOUTH TWICE A DAY (Patient taking differently: Take 600 mg by mouth 2 (two) times daily.) 360 capsule 1   hydrALAZINE (APRESOLINE) 25 MG tablet Take 1 tablet (25 mg total) by mouth every 8 (eight) hours. 90 tablet 0   HYDROcodone-acetaminophen (NORCO) 7.5-325 MG tablet Take 1 tablet by mouth every 6 (six) hours as needed for moderate pain.     lisinopril (ZESTRIL) 40 MG tablet TAKE ONE TABLET BY MOUTH ONE TIME DAILY (Patient taking differently: Take 40 mg by mouth daily.) 90 tablet 3   methocarbamol (ROBAXIN) 750 MG tablet Take 750 mg by mouth every 8 (eight) hours as needed for muscle spasms.     metoprolol tartrate (LOPRESSOR) 50 MG tablet Take 1 tablet (50 mg total) by mouth 2 (two) times daily. 60 tablet 0   Omega-3 Fatty Acids (FISH OIL PO) Take 1 Capful by mouth daily. (Patient not taking: Reported on 06/30/2022)     pomalidomide (POMALYST) 4 MG capsule Take 1 capsule (4 mg total) by mouth See admin instructions. Take 4 mg by mouth once a day for 21 days "on," then 7 days "off." Repeat every 28 days. 21 capsule 0   potassium chloride SA (KLOR-CON M) 20 MEQ tablet Take 2 tablets (40 mEq total) by mouth daily. 10 tablet 0   sodium chloride (OCEAN) 0.65 % SOLN  nasal spray Place 1 spray into both nostrils as needed for  congestion.     traMADol (ULTRAM) 50 MG tablet Take 1 tablet (50 mg total) by mouth every 6 (six) hours as needed. (Patient taking differently: Take 50 mg by mouth every 6 (six) hours as needed for moderate pain.) 30 tablet 0   No current facility-administered medications for this encounter.    Allergies  Allergen Reactions   Linezolid Other (See Comments)    Burning tongue    Vancomycin Rash    Social History   Socioeconomic History   Marital status: Married    Spouse name: Not on file   Number of children: 1   Years of education: law school   Highest education level: Not on file  Occupational History   Occupation: lawyer  Tobacco Use   Smoking status: Never   Smokeless tobacco: Never  Vaping Use   Vaping Use: Never used  Substance and Sexual Activity   Alcohol use: Yes    Alcohol/week: 2.0 standard drinks of alcohol    Types: 2 Glasses of wine per week    Comment: 2 glasses of wine a week, occ bourbon, beer   Drug use: No   Sexual activity: Not Currently  Other Topics Concern   Not on file  Social History Narrative   Lives in [place] with spouse in a 2 story home.  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    Social Determinants of Health   Financial Resource Strain: Not on file  Food Insecurity: No Food Insecurity (06/30/2022)   Hunger Vital Sign    Worried About Running Out of Food in the Last Year: Never true    Ran Out of Food in the Last Year: Never true  Transportation Needs: No Transportation Needs (06/30/2022)   PRAPARE - Hydrologist (Medical): No    Lack of Transportation (Non-Medical): No  Physical Activity: Not on file  Stress: Not on file  Social Connections: Not on file  Intimate Partner Violence: Not At Risk (06/30/2022)   Humiliation, Afraid, Rape, and Kick questionnaire    Fear of Current or  Ex-Partner: No    Emotionally Abused: No    Physically Abused: No    Sexually Abused: No     ROS- All systems are reviewed and negative except as per the HPI above.  Physical Exam: Vitals:   07/15/22 1041  Weight: 109.1 kg  Height: '5\' 10"'$  (1.778 m)     GEN- The patient is a well appearing male, alert and oriented x 3 today.   HEENT-head normocephalic, atraumatic, sclera clear, conjunctiva pink, hearing intact, trachea midline. Lungs- Clear to ausculation bilaterally, normal work of breathing Heart- irregular rate and rhythm, no murmurs, rubs or gallops  GI- soft, NT, ND, + BS Extremities- no clubbing, cyanosis, 1+ bilateral edema MS- no significant deformity or atrophy Skin- no rash or lesion Psych- euthymic mood, full affect Neuro- strength and sensation are intact   Wt Readings from Last 3 Encounters:  07/15/22 109.1 kg  07/14/22 108.5 kg  07/07/22 108 kg    EKG today demonstrates Coarse afib vs atypical atrial flutter with variable block Vent. rate 104 BPM PR interval * ms QRS duration 88 ms QT/QTcB 358/470 ms    Echo 06/22/22  1. Appears to be in sinus rhythm at the time of evaluation- A waves  present on diastology. Left ventricular ejection fraction, by estimation,  is 60 to 65%. The left  ventricle has normal function. The left ventricle  has no regional wall motion abnormalities. There is mild concentric left ventricular hypertrophy. Left ventricular diastolic parameters are consistent with Grade II diastolic dysfunction (pseudonormalization).   2. Right ventricular systolic function is normal. The right ventricular  size is normal. There is normal pulmonary artery systolic pressure.   3. The mitral valve is grossly normal. No evidence of mitral valve  regurgitation.   4. The aortic valve is tricuspid. Aortic valve regurgitation is not  visualized. No aortic stenosis is present.   5. The inferior vena cava is normal in size with greater than 50%   respiratory variability, suggesting right atrial pressure of 3 mmHg.     Epic records are reviewed at length today  CHA2DS2-VASc Score = 3  The patient's score is based upon: CHF History: 1 HTN History: 1 Diabetes History: 0 Stroke History: 0 Vascular Disease History: 0 Age Score: 1 Gender Score: 0        ASSESSMENT AND PLAN: 1. Persistent Atrial Fibrillation/atrial flutter The patient's CHA2DS2-VASc score is 3, indicating a 3.2% annual risk of stroke.   S/p afib ablation 04/05/17 He remains in afib today. After discussing the risks and benefits, will plan for DCCV.  Continue amiodarone 200 mg daily Increase metoprolol tartrate to 75 mg BID in the meantime, decrease back to 50 mg BID after DCCV.  Continue Eliquis 5 mg BID  2. Secondary Hypercoagulable State (ICD10:  D68.69) The patient is at significant risk for stroke/thromboembolism based upon his CHA2DS2-VASc Score of 3.  Continue Apixaban (Eliquis).   3. Obesity Body mass index is 34.52 kg/m. Lifestyle modification was discussed and encouraged including regular physical activity and weight reduction.  4. Obstructive sleep apnea Encouraged compliance with CPAP therapy.  5. HTN Mildly elevated today, increase BB as above. Reassess in SR.   6. Multiple Myeloma Followed by Dr Lorenso Courier  7. Chronic diastolic CHF Fluid status appears stable today. Instructions for PRN Lasix reviewed today.    Follow up with Dr Harrington Challenger as scheduled. AF clinic post DCCV.    [facility] [address] [place], [place] [address] [phone]

## 2022-07-16 ENCOUNTER — Other Ambulatory Visit: Payer: Self-pay

## 2022-07-16 ENCOUNTER — Ambulatory Visit
Admission: RE | Admit: 2022-07-16 | Discharge: 2022-07-16 | Disposition: A | Discharge status: Home / Self Care | Payer: Medicare Other | Source: Ambulatory Visit | Attending: Radiation Oncology | Admitting: Radiation Oncology

## 2022-07-16 DIAGNOSIS — Z981 Arthrodesis status: Secondary | ICD-10-CM | POA: Diagnosis not present

## 2022-07-16 DIAGNOSIS — I480 Paroxysmal atrial fibrillation: Secondary | ICD-10-CM | POA: Diagnosis not present

## 2022-07-16 DIAGNOSIS — G4733 Obstructive sleep apnea (adult) (pediatric): Secondary | ICD-10-CM | POA: Diagnosis not present

## 2022-07-16 DIAGNOSIS — M8458XA Pathological fracture in neoplastic disease, other specified site, initial encounter for fracture: Secondary | ICD-10-CM | POA: Diagnosis not present

## 2022-07-16 DIAGNOSIS — Z86718 Personal history of other venous thrombosis and embolism: Secondary | ICD-10-CM | POA: Diagnosis not present

## 2022-07-16 DIAGNOSIS — Z4789 Encounter for other orthopedic aftercare: Secondary | ICD-10-CM | POA: Diagnosis not present

## 2022-07-16 DIAGNOSIS — C9000 Multiple myeloma not having achieved remission: Secondary | ICD-10-CM | POA: Diagnosis not present

## 2022-07-16 DIAGNOSIS — I119 Hypertensive heart disease without heart failure: Secondary | ICD-10-CM | POA: Diagnosis not present

## 2022-07-16 DIAGNOSIS — Z5112 Encounter for antineoplastic immunotherapy: Secondary | ICD-10-CM | POA: Diagnosis not present

## 2022-07-16 DIAGNOSIS — Z923 Personal history of irradiation: Secondary | ICD-10-CM | POA: Diagnosis not present

## 2022-07-16 DIAGNOSIS — E876 Hypokalemia: Secondary | ICD-10-CM | POA: Diagnosis not present

## 2022-07-16 DIAGNOSIS — Z7901 Long term (current) use of anticoagulants: Secondary | ICD-10-CM | POA: Diagnosis not present

## 2022-07-16 DIAGNOSIS — G629 Polyneuropathy, unspecified: Secondary | ICD-10-CM | POA: Diagnosis not present

## 2022-07-16 DIAGNOSIS — M1990 Unspecified osteoarthritis, unspecified site: Secondary | ICD-10-CM | POA: Diagnosis not present

## 2022-07-16 DIAGNOSIS — E559 Vitamin D deficiency, unspecified: Secondary | ICD-10-CM | POA: Diagnosis not present

## 2022-07-16 DIAGNOSIS — Z510 Encounter for antineoplastic radiation therapy: Secondary | ICD-10-CM | POA: Diagnosis not present

## 2022-07-16 LAB — RAD ONC ARIA SESSION SUMMARY
Course Elapsed Days: 3
Plan Fractions Treated to Date: 4
Plan Prescribed Dose Per Fraction: 3 Gy
Plan Total Fractions Prescribed: 9
Plan Total Prescribed Dose: 27 Gy
Reference Point Dosage Given to Date: 12 Gy
Reference Point Session Dosage Given: 3 Gy
Session Number: 4

## 2022-07-19 ENCOUNTER — Other Ambulatory Visit: Payer: Self-pay

## 2022-07-19 ENCOUNTER — Telehealth (HOSPITAL_COMMUNITY): Payer: Self-pay | Admitting: *Deleted

## 2022-07-19 ENCOUNTER — Ambulatory Visit
Admission: RE | Admit: 2022-07-19 | Discharge: 2022-07-19 | Disposition: A | Discharge status: Home / Self Care | Payer: Medicare Other | Source: Ambulatory Visit | Attending: Radiation Oncology | Admitting: Radiation Oncology

## 2022-07-19 DIAGNOSIS — Z5112 Encounter for antineoplastic immunotherapy: Secondary | ICD-10-CM | POA: Diagnosis not present

## 2022-07-19 DIAGNOSIS — Z7901 Long term (current) use of anticoagulants: Secondary | ICD-10-CM | POA: Diagnosis not present

## 2022-07-19 DIAGNOSIS — Z86718 Personal history of other venous thrombosis and embolism: Secondary | ICD-10-CM | POA: Diagnosis not present

## 2022-07-19 DIAGNOSIS — C9000 Multiple myeloma not having achieved remission: Secondary | ICD-10-CM | POA: Diagnosis not present

## 2022-07-19 DIAGNOSIS — Z923 Personal history of irradiation: Secondary | ICD-10-CM | POA: Diagnosis not present

## 2022-07-19 DIAGNOSIS — M8458XA Pathological fracture in neoplastic disease, other specified site, initial encounter for fracture: Secondary | ICD-10-CM | POA: Diagnosis not present

## 2022-07-19 DIAGNOSIS — E876 Hypokalemia: Secondary | ICD-10-CM | POA: Diagnosis not present

## 2022-07-19 DIAGNOSIS — Z510 Encounter for antineoplastic radiation therapy: Secondary | ICD-10-CM | POA: Diagnosis not present

## 2022-07-19 LAB — RAD ONC ARIA SESSION SUMMARY
Course Elapsed Days: 6
Plan Fractions Treated to Date: 5
Plan Prescribed Dose Per Fraction: 3 Gy
Plan Total Fractions Prescribed: 9
Plan Total Prescribed Dose: 27 Gy
Reference Point Dosage Given to Date: 15 Gy
Reference Point Session Dosage Given: 3 Gy
Session Number: 5

## 2022-07-19 NOTE — Telephone Encounter (Signed)
Pt states he converted into NSR this morning HR was in the 50s. Pt only took 25mg  of metoprolol was unsure of what to do going forward with metoprolol. Instructed 50mg  BID per recommendations of last visit - if still in nsr in few days pt will let me know so can cancel dccv that is scheduled. Pt in agreement.

## 2022-07-20 ENCOUNTER — Ambulatory Visit
Admission: RE | Admit: 2022-07-20 | Discharge: 2022-07-20 | Disposition: A | Discharge status: Home / Self Care | Payer: Medicare Other | Source: Ambulatory Visit | Attending: Radiation Oncology | Admitting: Radiation Oncology

## 2022-07-20 ENCOUNTER — Other Ambulatory Visit: Payer: Self-pay

## 2022-07-20 DIAGNOSIS — C9000 Multiple myeloma not having achieved remission: Secondary | ICD-10-CM | POA: Diagnosis not present

## 2022-07-20 DIAGNOSIS — Z7901 Long term (current) use of anticoagulants: Secondary | ICD-10-CM | POA: Diagnosis not present

## 2022-07-20 DIAGNOSIS — M8458XA Pathological fracture in neoplastic disease, other specified site, initial encounter for fracture: Secondary | ICD-10-CM | POA: Diagnosis not present

## 2022-07-20 DIAGNOSIS — I119 Hypertensive heart disease without heart failure: Secondary | ICD-10-CM | POA: Diagnosis not present

## 2022-07-20 DIAGNOSIS — G4733 Obstructive sleep apnea (adult) (pediatric): Secondary | ICD-10-CM | POA: Diagnosis not present

## 2022-07-20 DIAGNOSIS — E876 Hypokalemia: Secondary | ICD-10-CM | POA: Diagnosis not present

## 2022-07-20 DIAGNOSIS — Z86718 Personal history of other venous thrombosis and embolism: Secondary | ICD-10-CM | POA: Diagnosis not present

## 2022-07-20 DIAGNOSIS — Z4789 Encounter for other orthopedic aftercare: Secondary | ICD-10-CM | POA: Diagnosis not present

## 2022-07-20 DIAGNOSIS — G629 Polyneuropathy, unspecified: Secondary | ICD-10-CM | POA: Diagnosis not present

## 2022-07-20 DIAGNOSIS — Z5112 Encounter for antineoplastic immunotherapy: Secondary | ICD-10-CM | POA: Diagnosis not present

## 2022-07-20 DIAGNOSIS — Z981 Arthrodesis status: Secondary | ICD-10-CM | POA: Diagnosis not present

## 2022-07-20 DIAGNOSIS — I480 Paroxysmal atrial fibrillation: Secondary | ICD-10-CM | POA: Diagnosis not present

## 2022-07-20 DIAGNOSIS — M1990 Unspecified osteoarthritis, unspecified site: Secondary | ICD-10-CM | POA: Diagnosis not present

## 2022-07-20 DIAGNOSIS — Z923 Personal history of irradiation: Secondary | ICD-10-CM | POA: Diagnosis not present

## 2022-07-20 DIAGNOSIS — E559 Vitamin D deficiency, unspecified: Secondary | ICD-10-CM | POA: Diagnosis not present

## 2022-07-20 DIAGNOSIS — Z510 Encounter for antineoplastic radiation therapy: Secondary | ICD-10-CM | POA: Diagnosis not present

## 2022-07-20 LAB — RAD ONC ARIA SESSION SUMMARY
Course Elapsed Days: 7
Plan Fractions Treated to Date: 6
Plan Prescribed Dose Per Fraction: 3 Gy
Plan Total Fractions Prescribed: 9
Plan Total Prescribed Dose: 27 Gy
Reference Point Dosage Given to Date: 18 Gy
Reference Point Session Dosage Given: 3 Gy
Session Number: 6

## 2022-07-21 ENCOUNTER — Other Ambulatory Visit: Payer: Self-pay

## 2022-07-21 ENCOUNTER — Inpatient Hospital Stay: Payer: Medicare Other

## 2022-07-21 ENCOUNTER — Inpatient Hospital Stay (HOSPITAL_BASED_OUTPATIENT_CLINIC_OR_DEPARTMENT_OTHER): Payer: Medicare Other | Admitting: Physician Assistant

## 2022-07-21 ENCOUNTER — Encounter: Payer: Self-pay | Admitting: Internal Medicine

## 2022-07-21 ENCOUNTER — Ambulatory Visit
Admission: RE | Admit: 2022-07-21 | Discharge: 2022-07-21 | Disposition: A | Discharge status: Home / Self Care | Payer: Medicare Other | Source: Ambulatory Visit | Attending: Radiation Oncology | Admitting: Radiation Oncology

## 2022-07-21 VITALS — BP 136/68 | HR 53 | Resp 18

## 2022-07-21 DIAGNOSIS — M8458XA Pathological fracture in neoplastic disease, other specified site, initial encounter for fracture: Secondary | ICD-10-CM | POA: Diagnosis not present

## 2022-07-21 DIAGNOSIS — Z5112 Encounter for antineoplastic immunotherapy: Secondary | ICD-10-CM | POA: Diagnosis not present

## 2022-07-21 DIAGNOSIS — E876 Hypokalemia: Secondary | ICD-10-CM | POA: Diagnosis not present

## 2022-07-21 DIAGNOSIS — Z86718 Personal history of other venous thrombosis and embolism: Secondary | ICD-10-CM | POA: Diagnosis not present

## 2022-07-21 DIAGNOSIS — C9000 Multiple myeloma not having achieved remission: Secondary | ICD-10-CM

## 2022-07-21 DIAGNOSIS — Z510 Encounter for antineoplastic radiation therapy: Secondary | ICD-10-CM | POA: Diagnosis not present

## 2022-07-21 DIAGNOSIS — Z923 Personal history of irradiation: Secondary | ICD-10-CM | POA: Diagnosis not present

## 2022-07-21 DIAGNOSIS — Z7901 Long term (current) use of anticoagulants: Secondary | ICD-10-CM | POA: Diagnosis not present

## 2022-07-21 LAB — RAD ONC ARIA SESSION SUMMARY
Course Elapsed Days: 8
Plan Fractions Treated to Date: 7
Plan Prescribed Dose Per Fraction: 3 Gy
Plan Total Fractions Prescribed: 9
Plan Total Prescribed Dose: 27 Gy
Reference Point Dosage Given to Date: 21 Gy
Reference Point Session Dosage Given: 3 Gy
Session Number: 7

## 2022-07-21 LAB — CBC WITH DIFFERENTIAL (CANCER CENTER ONLY)
Abs Immature Granulocytes: 0.13 10*3/uL — ABNORMAL HIGH (ref 0.00–0.07)
Basophils Absolute: 0 10*3/uL (ref 0.0–0.1)
Basophils Relative: 0 %
Eosinophils Absolute: 0.2 10*3/uL (ref 0.0–0.5)
Eosinophils Relative: 2 %
HCT: 33.2 % — ABNORMAL LOW (ref 39.0–52.0)
Hemoglobin: 10.6 g/dL — ABNORMAL LOW (ref 13.0–17.0)
Immature Granulocytes: 2 %
Lymphocytes Relative: 6 %
Lymphs Abs: 0.4 10*3/uL — ABNORMAL LOW (ref 0.7–4.0)
MCH: 32.7 pg (ref 26.0–34.0)
MCHC: 31.9 g/dL (ref 30.0–36.0)
MCV: 102.5 fL — ABNORMAL HIGH (ref 80.0–100.0)
Monocytes Absolute: 0.3 10*3/uL (ref 0.1–1.0)
Monocytes Relative: 4 %
Neutro Abs: 6.9 10*3/uL (ref 1.7–7.7)
Neutrophils Relative %: 86 %
Platelet Count: 136 10*3/uL — ABNORMAL LOW (ref 150–400)
RBC: 3.24 MIL/uL — ABNORMAL LOW (ref 4.22–5.81)
RDW: 18.5 % — ABNORMAL HIGH (ref 11.5–15.5)
WBC Count: 7.9 10*3/uL (ref 4.0–10.5)
nRBC: 0.5 % — ABNORMAL HIGH (ref 0.0–0.2)

## 2022-07-21 LAB — CMP (CANCER CENTER ONLY)
ALT: 63 U/L — ABNORMAL HIGH (ref 0–44)
AST: 22 U/L (ref 15–41)
Albumin: 3.4 g/dL — ABNORMAL LOW (ref 3.5–5.0)
Alkaline Phosphatase: 110 U/L (ref 38–126)
Anion gap: 6 (ref 5–15)
BUN: 16 mg/dL (ref 8–23)
CO2: 31 mmol/L (ref 22–32)
Calcium: 8.8 mg/dL — ABNORMAL LOW (ref 8.9–10.3)
Chloride: 106 mmol/L (ref 98–111)
Creatinine: 0.88 mg/dL (ref 0.61–1.24)
GFR, Estimated: >60 mL/min (ref >60)
Glucose, Bld: 116 mg/dL — ABNORMAL HIGH (ref 70–99)
Potassium: 4.2 mmol/L (ref 3.5–5.1)
Sodium: 143 mmol/L (ref 135–145)
Total Bilirubin: 0.5 mg/dL (ref 0.3–1.2)
Total Protein: 5.5 g/dL — ABNORMAL LOW (ref 6.5–8.1)

## 2022-07-21 MED ORDER — BORTEZOMIB CHEMO SQ INJECTION 3.5 MG (2.5MG/ML)
1.3000 mg/m2 | Freq: Once | INTRAMUSCULAR | Status: AC
  Administered 2022-07-21: 3 mg via SUBCUTANEOUS
  Filled 2022-07-21: qty 1.2

## 2022-07-21 NOTE — Patient Instructions (Signed)
[facility]  Discharge Instructions: Thank you for choosing [facility] to provide your oncology and hematology care.   If you have a lab appointment with the Cancer Center, please go directly to the Cancer Center and check in at the registration area.   Wear comfortable clothing and clothing appropriate for easy access to any Portacath or PICC line.   We strive to give you quality time with your provider. You may need to reschedule your appointment if you arrive late (15 or more minutes).  Arriving late affects you and other patients whose appointments are after yours.  Also, if you miss three or more appointments without notifying the office, you may be dismissed from the clinic at the provider's discretion.      For prescription refill requests, have your pharmacy contact our office and allow 72 hours for refills to be completed.    Today you received the following chemotherapy and/or immunotherapy agents: Velcade     To help prevent nausea and vomiting after your treatment, we encourage you to take your nausea medication as directed.  BELOW ARE SYMPTOMS THAT SHOULD BE REPORTED IMMEDIATELY: *FEVER GREATER THAN 100.4 F (38 C) OR HIGHER *CHILLS OR SWEATING *NAUSEA AND VOMITING THAT IS NOT CONTROLLED WITH YOUR NAUSEA MEDICATION *UNUSUAL SHORTNESS OF BREATH *UNUSUAL BRUISING OR BLEEDING *URINARY PROBLEMS (pain or burning when urinating, or frequent urination) *BOWEL PROBLEMS (unusual diarrhea, constipation, pain near the anus) TENDERNESS IN MOUTH AND THROAT WITH OR WITHOUT PRESENCE OF ULCERS (sore throat, sores in mouth, or a toothache) UNUSUAL RASH, SWELLING OR PAIN  UNUSUAL VAGINAL DISCHARGE OR ITCHING   Items with * indicate a potential emergency and should be followed up as soon as possible or go to the Emergency Department if any problems should occur.  Please show the CHEMOTHERAPY ALERT CARD or IMMUNOTHERAPY ALERT CARD at check-in  to the Emergency Department and triage nurse.  Should you have questions after your visit or need to cancel or reschedule your appointment, please contact [facility]  Dept: [phone]  and follow the prompts.  Office hours are 8:00 a.m. to 4:30 p.m. Monday - Friday. Please note that voicemails left after 4:00 p.m. may not be returned until the following business day.  We are closed weekends and major holidays. You have access to a nurse at all times for urgent questions. Please call the main number to the clinic Dept: [phone] and follow the prompts.   For any non-urgent questions, you may also contact your provider using MyChart. We now offer e-Visits for anyone 18 and older to request care online for non-urgent symptoms. For details visit [email or web address].   Also download the MyChart app! Go to the app store, search "MyChart", open the app, select , and log in with your MyChart username and password.   

## 2022-07-21 NOTE — Progress Notes (Signed)
[place] [phone]   [phone]  PROGRESS NOTE  Patient Care Team: [name], MD as PCP - General (Internal Medicine) Fay Records, MD as PCP - Cardiology (Cardiology) Sueanne Margarita, MD as PCP - Sleep Medicine (Cardiology) Vickie Epley, MD as PCP - Electrophysiology (Cardiology) Michael Boston, MD as Consulting Physician (General Surgery) Clarene Essex, MD as Consulting Physician (Gastroenterology) Sueanne Margarita, MD as Consulting Physician (Sleep Medicine) Alda Berthold, DO as Consulting Physician (Neurology)  Hematological/Oncological History # IgA Kappa Multiple Myeloma 05/05/2020: Bone marrow biopsy showed increased number of plasma cells representing 14% of all cells in the aspirate associated with numerous variably sized clusters in the clot and biopsy sections 04/10/2021: M protein 0.3, IgA kappa specificity. Kappa 580.5, Labda 7.0, ratio 82.93.   12/07/2021: Labs show of 1971.3, lambda 5.5, ratio 358.42 12/10/2021: Transition care to Dr. Lorenso Courier. 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  01/26/2022: Cycle 2 Day 1 of Dara/Rev/Dex 02/22/2022: Cycle 3 Day 1 of Dara/Rev/Dex 03/23/2022: Cycle 4 Day 1 of Dara/Rev/Dex 04/20/2022: Cycle 5 Day 1 of Dara/Rev/Dex 05/18/2022: Cycle 6 Day 1 of Dara/Rev/Dex 05/27/2022: MRI cervical/thoracic/lumbar: pathologic fracture at T2 with significant osseous retropulsion and resulting cord compression. Additional smaller lesions within T1 vertebral body, right C7 and T3 articulating facets.  Small Multiple Myeloma lesions scattered at all lumbar levels and in the visible left sacral ala. Largest lumbar tumor is 2.3 cm in the left L1 posterior elements and pedicle. No lumbar extraosseous or epidural tumor extension, or pathologic fracture.Underlying chronic lumbar spine  degeneration, with a new central disc herniation at L3-L4 since 2021 now resulting in mild to moderate degenerative spinal stenosis there. 05/28/2022-06/09/2022: Admitted for T2 pathologic fracture and cord compression. He received urgent radiation therapy for a total of 3.0 Gy. He underwent  thoracic 1 through 4 posterior lateral arthrodesis/fusion and 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  06/23/2022: Cycle 1 Day 1 of Velcade/Pomalyst/Dex  Interval History:  Travis [name] 72 y.o. male with medical history significant for IgA Kappa Multiple Myeloma who presents for a follow up visit. He was last seen on 07/14/2022. In the interim, he continues on Velcade/Pomalyst/Dex therapy  On exam today Travis [name] reports that he is tolerating therapy without any new or concerning symptoms. He continues physical therapy to help with his mobility of his neck and upper back. He continues to ambulate with his walker. He denies nausea, vomiting or abdominal pain. His bowel habits are unchanged without recurrent episodes of diarrhea or constipation.[name] His neuropathy is stable. He denies easy bruising or signs of active bleeding. He continues to struggle with lower extremity edema and increase dose of lasix today from 40 mg to 60 mg.  He denies any fevers, chills, sweats, shortness of breath, chest pain.  A full 10 point ROS is listed below.  Overall he is willing and able to proceed with treatment today.  MEDICAL HISTORY:  Past Medical History:  Diagnosis Date   A-fib [facility])    Arthritis    "comes and goes; mostly in hands, occasionally elbow" (0000000)   Complication of anesthesia    "just wanted to sleep alot  for couple days S/P VARICOCELE OR" (04/05/2017)   DVT (deep venous thrombosis) (HCC)    LLE   Dysrhythmia    PVCs    GERD (gastroesophageal reflux disease)  History of hiatal hernia    Hypertension    Impaired glucose tolerance    Multiple myeloma (HCC)    Obesity (BMI  30-39.9) 07/26/2016   OSA on CPAP 07/26/2016   Mild with AHI 12.5/hr now on CPAP at 14cm H2O   PAF (paroxysmal atrial fibrillation) ([place])    s/p PVI Ablation in 2018 // Flecainide Rx // Echo 8/21: EF 60-65, no RWMA, mild LVH, normal RV SF, moderate RVE, mild BAE, trivial MR, mild dilation of aortic root (40 mm)   Pneumonia    "may have had walking pneumonia a few years ago" (04/05/2017)   Pre-diabetes    Thrombophlebitis     SURGICAL HISTORY: Past Surgical History:  Procedure Laterality Date   ATRIAL FIBRILLATION ABLATION  04/05/2017   ATRIAL FIBRILLATION ABLATION N/A 04/05/2017   Procedure: ATRIAL FIBRILLATION ABLATION;  Surgeon: Thompson Grayer, MD;  Location: [place] [facility];  Service: Cardiovascular;  Laterality: N/A;   BONE BIOPSY Right 12/17/2021   Procedure: BONE BIOPSY;  Surgeon: Hiram Gash, MD;  Location: [place];  Service: Orthopedics;  Laterality: Right;   COMPLETE RIGHT HIP REPLACMENT  01/19/2019   EVALUATION UNDER ANESTHESIA WITH HEMORRHOIDECTOMY N/A 02/24/2017   Procedure: EXAM UNDER ANESTHESIA WITH  HEMORRHOIDECTOMY;  Surgeon: Michael Boston, MD;  Location: [facility];  Service: General;  Laterality: N/A;   EXCISIONAL TOTAL HIP ARTHROPLASTY WITH ANTIBIOTIC SPACERS Right 09/25/2020   Procedure: EXCISIONAL TOTAL HIP ARTHROPLASTY WITH ANTIBIOTIC SPACERS;  Surgeon: Paralee Cancel, MD;  Location: [facility];  Service: Orthopedics;  Laterality: Right;   FLEXIBLE SIGMOIDOSCOPY N/A 04/15/2015   Procedure:  UNSEDATED FLEXIBLE SIGMOIDOSCOPY;  Surgeon: [name], MD;  Location: [facility];  Service: Endoscopy;  Laterality: N/A;   HUMERUS IM NAIL Right 12/17/2021   Procedure: INTRAMEDULLARY (IM) NAIL HUMERAL;  Surgeon: Hiram Gash, MD;  Location: [place];  Service: Orthopedics;  Laterality: Right;   KNEE ARTHROSCOPY Left ~ 2016   POSTERIOR CERVICAL FUSION/FORAMINOTOMY N/A 06/04/2022   Procedure: Thoracic one - Thoracic Four Posterior lateral  arthrodesis/ fusion and Thoracic two Corpectomy;  Surgeon: Ashok Pall, MD;  Location: [place];  Service: Neurosurgery;  Laterality: N/A;   REIMPLANTATION OF TOTAL HIP Right 12/25/2020   Procedure: REIMPLANTATION/REVISION OF RIGHT TOTAL HIP VERSUS REPEAT IRRIGATION AND DEBRIDEMENT;  Surgeon: Paralee Cancel, MD;  Location: [facility];  Service: Orthopedics;  Laterality: Right;   TESTICLE SURGERY  1988   VARICOCELE   TONSILLECTOMY     VARICOSE VEIN SURGERY Bilateral     SOCIAL HISTORY: Social History   Socioeconomic History   Marital status: Married    Spouse name: Not on file   Number of children: 1   Years of education: law school   Highest education level: Not on file  Occupational History   Occupation: lawyer  Tobacco Use   Smoking status: Never   Smokeless tobacco: Never  Vaping Use   Vaping Use: Never used  Substance and Sexual Activity   Alcohol use: Yes    Alcohol/week: 2.0 standard drinks of alcohol    Types: 2 Glasses of wine per week    Comment: 2 glasses of wine a week, occ bourbon, beer   Drug use: No   Sexual activity: Not Currently  Other Topics Concern   Not on file  Social History Narrative   Lives in [place] with spouse in a 2 story home.  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    Social Determinants of Health   Financial Resource Strain: Not on file  Food Insecurity: No Food Insecurity (06/30/2022)   Hunger Vital Sign    Worried About Running Out of Food in the Last Year: Never true    Ran Out of Food in the Last Year: Never true  Transportation Needs: No Transportation Needs (06/30/2022)   PRAPARE - Hydrologist (Medical): No    Lack of Transportation (Non-Medical): No  Physical Activity: Not on file  Stress: Not on file  Social Connections: Not on file  Intimate Partner Violence: Not At Risk (06/30/2022)   Humiliation, Afraid, Rape, and Kick questionnaire     Fear of Current or Ex-Partner: No    Emotionally Abused: No    Physically Abused: No    Sexually Abused: No    FAMILY HISTORY: Family History  Problem Relation Age of Onset   Dementia Mother    Stroke Father    Atrial fibrillation Father    Hypertension Father    Hypertension Paternal Grandfather    Dementia Maternal Grandmother     ALLERGIES:  is allergic to linezolid and vancomycin.  MEDICATIONS:  Current Outpatient Medications  Medication Sig Dispense Refill   acetaminophen (TYLENOL) 325 MG tablet Take 2 tablets (650 mg total) by mouth every 6 (six) hours as needed. (Patient taking differently: Take 650 mg by mouth as needed for mild pain or headache.) 36 tablet 0   acyclovir (ZOVIRAX) 400 MG tablet Take 1 tablet (400 mg total) by mouth 2 (two) times daily. 180 tablet 3   amiodarone (PACERONE) 200 MG tablet Take 1 tablet (200 mg total) by mouth daily. Start after finishing 40 mg twice daily dose of  days 180 tablet 1   apixaban (ELIQUIS) 5 MG TABS tablet Take 1 tablet (5 mg total) by mouth 2 (two) times daily. 60 tablet 11   cholecalciferol (VITAMIN D3) 25 MCG (1000 UNIT) tablet Take 1,000 Units by mouth daily.     Cyanocobalamin (VITAMIN B-12 PO) Take 1,000 mcg by mouth daily.     dexamethasone (DECADRON) 4 MG tablet Take 40 mg by mouth See admin instructions. Take 40 mg by mouth once a day the morning of chemo on Wednesday- or unless otherwise instructed     fluticasone (FLONASE) 50 MCG/ACT nasal spray Place 1 spray into both nostrils daily as needed for allergies or rhinitis.     folic acid (FOLVITE) 1 MG tablet Take 1 mg by mouth daily.     furosemide (LASIX) 40 MG tablet Take 40 mg by mouth daily as needed for edema. Pt is took 60 mg today since 40 was not working     gabapentin (NEURONTIN) 300 MG capsule TAKE TWO CAPSULES BY MOUTH TWICE A DAY (Patient taking differently: Take 600 mg by mouth 2 (two) times daily.) 360 capsule 1   hydrALAZINE (APRESOLINE) 25 MG tablet Take 1  tablet (25 mg total) by mouth every 8 (eight) hours. 90 tablet 0   lisinopril (ZESTRIL) 40 MG tablet TAKE ONE TABLET BY MOUTH ONE TIME DAILY (Patient taking differently: Take 40 mg by mouth daily.) 90 tablet 3   methocarbamol (ROBAXIN) 750 MG tablet Take 750 mg by mouth every 8 (eight) hours as needed for muscle spasms.     metoprolol tartrate (LOPRESSOR) 50 MG tablet Take 1 tablet (50 mg total) by mouth 2 (two) times daily. 60 tablet 0   Omega-3 Fatty Acids (FISH OIL PO) Take  1 Capful by mouth daily.     pomalidomide (POMALYST) 4 MG capsule Take 1 capsule (4 mg total) by mouth See admin instructions. Take 4 mg by mouth once a day for 21 days "on," then 7 days "off." Repeat every 28 days. 21 capsule 0   potassium chloride SA (KLOR-CON M) 20 MEQ tablet Take 2 tablets (40 mEq total) by mouth daily. 10 tablet 0   sodium chloride (OCEAN) 0.65 % SOLN nasal spray Place 1 spray into both nostrils as needed for congestion.     traMADol (ULTRAM) 50 MG tablet Take 1 tablet (50 mg total) by mouth every 6 (six) hours as needed. (Patient taking differently: Take 50 mg by mouth every 6 (six) hours as needed for moderate pain.) 30 tablet 0   amiodarone (PACERONE) 400 MG tablet Take 1 tablet (400 mg total) by mouth 2 (two) times daily for 4 days. After that take 200 mg daily home dose 8 tablet 0   No current facility-administered medications for this visit.    REVIEW OF SYSTEMS:   Constitutional: ( - ) fevers, ( - )  chills , ( - ) night sweats Eyes: ( - ) blurriness of vision, ( - ) double vision, ( - ) watery eyes Ears, nose, mouth, throat, and face: ( - ) mucositis, ( - ) sore throat Respiratory: ( - ) cough, ( - ) dyspnea, ( - ) wheezes Cardiovascular: ( - ) palpitation, ( - ) chest discomfort, ( - ) lower extremity swelling Gastrointestinal:  ( - ) nausea, ( - ) heartburn, ( - ) change in bowel habits Skin: ( - ) abnormal skin rashes Lymphatics: ( - ) new lymphadenopathy, ( - ) easy  bruising Neurological: ( - ) numbness, ( - ) tingling, ( - ) new weaknesses Behavioral/Psych: ( - ) mood change, ( - ) new changes  All other systems were reviewed with the patient and are negative.  PHYSICAL EXAMINATION: ECOG PERFORMANCE STATUS: 2 - Symptomatic, <50% confined to bed  Vitals:   07/21/22 1351  BP: (!) 153/76  Pulse: (!) 55  Resp: 16  Temp: 98.1 F (36.7 C)  SpO2: 95%    Filed Weights   07/21/22 1351  Weight: 247 lb 3.2 oz (112.1 kg)     GENERAL: Well-appearing elderly Caucasian male, alert, no distress and comfortable SKIN: no overt abnormalities of the skin.  EYES: conjunctiva are pink and non-injected, sclera clear LUNGS: clear to auscultation and percussion with normal breathing effort HEART: tachycardic &  no murmurs. +bilateral lower extremity edema Musculoskeletal: no cyanosis of digits and no clubbing  PSYCH: alert & oriented x 3, fluent speech NEURO: no focal motor/sensory deficits  LABORATORY DATA:  I have reviewed the data as listed    Latest Ref Rng & Units 07/21/2022    1:25 PM 07/14/2022   12:41 PM 07/03/2022    2:57 AM  CBC  WBC 4.0 - 10.5 K/uL 7.9  8.7  11.7   Hemoglobin 13.0 - 17.0 g/dL 10.6  10.9  9.8   Hematocrit 39.0 - 52.0 % 33.2  33.3  29.8   Platelets 150 - 400 K/uL 136  318  237        Latest Ref Rng & Units 07/21/2022    1:25 PM 07/14/2022   12:41 PM 07/06/2022    1:33 PM  CMP  Glucose 70 - 99 mg/dL 116  103    BUN 8 - 23 mg/dL 16  21  Creatinine 0.61 - 1.24 mg/dL 0.88  0.90    Sodium 135 - 145 mmol/L 143  140    Potassium 3.5 - 5.1 mmol/L 4.2  4.1  4.3   Chloride 98 - 111 mmol/L 106  105    CO2 22 - 32 mmol/L 31  29    Calcium 8.9 - 10.3 mg/dL 8.8  8.7    Total Protein 6.5 - 8.1 g/dL 5.5  5.9    Total Bilirubin 0.3 - 1.2 mg/dL 0.5  0.4    Alkaline Phos 38 - 126 U/L 110  113    AST 15 - 41 U/L 22  14    ALT 0 - 44 U/L 63  32      Lab Results  Component Value Date   MPROTEIN Not Observed 06/23/2022   MPROTEIN 0.1  (H) 05/27/2022   MPROTEIN 0.1 (H) 05/11/2022   Lab Results  Component Value Date   KPAFRELGTCHN 138.1 (H) 06/23/2022   KPAFRELGTCHN 233.8 (H) 05/27/2022   KPAFRELGTCHN 197.9 (H) 05/11/2022   LAMBDASER 3.4 (L) 06/23/2022   LAMBDASER 2.5 (L) 05/27/2022   LAMBDASER 2.6 (L) 05/11/2022   KAPLAMBRATIO 40.62 (H) 06/23/2022   KAPLAMBRATIO 93.52 (H) 05/27/2022   KAPLAMBRATIO 76.12 (H) 05/11/2022     RADIOGRAPHIC STUDIES: DG CHEST PORT 1 VIEW  Result Date: 07/01/2022 CLINICAL DATA:  Hypoxia EXAM: PORTABLE CHEST 1 VIEW COMPARISON:  Portable exam 1054 hours compared to 06/29/2022 FINDINGS: Normal heart size mediastinal contours. Extensive BILATERAL patchy airspace infiltrates consistent with multifocal pneumonia, little changed. No pleural effusion or pneumothorax. Bones demineralized with evidence of prior cervicothoracic fusion and proximal RIGHT humeral nailing. IMPRESSION: Patchy BILATERAL pulmonary infiltrates consistent with multifocal pneumonia, little changed. Electronically Signed   By: Lavonia Dana M.D.   On: 07/01/2022 11:57   CT T-SPINE NO CHARGE  Result Date: 06/29/2022 CLINICAL DATA:  Hypoxia, dyspnea, multiple myeloma EXAM: CT THORACIC SPINE WITHOUT CONTRAST TECHNIQUE: Multidetector CT images of the thoracic were obtained using the standard protocol without intravenous contrast. RADIATION DOSE REDUCTION: This exam was performed according to the departmental dose-optimization program which includes automated exposure control, adjustment of the mA and/or kV according to patient size and/or use of iterative reconstruction technique. COMPARISON:  06/03/2022, 05/29/2022 FINDINGS: Alignment: Exaggerated kyphosis centered at the T2 level related to prior corpectomy, spacer placement, and T1-T4 posterior fusion. Otherwise alignment is anatomic. Vertebrae: There are stable lytic lesions within the T8 and T10 vertebral bodies, T11 spinous process, and left L1 and L2 pedicles compatible with known  multiple myeloma. Expansile lytic lesions within the right posterior sixth and eighth ribs are stable. There are no acute displaced fractures. Interval T2 corpectomy with spacer placement, as well as posterior fusion spanning T1-T4. Orthopedic hardware is unremarkable. Paraspinal and other soft tissues: Widespread bilateral airspace disease and small bilateral pleural effusions are noted. Mediastinal and hilar lymphadenopathy. Paraspinal soft tissues are otherwise unremarkable. Disc levels: There is no significant bony encroachment upon the central canal or neural foramina. Disc spaces are relatively well preserved. IMPRESSION: 1. Stable lytic lesions within the thoracolumbar spine and thoracic cage, consistent with known multiple myeloma. 2. Interval postsurgical changes from T2 corpectomy, spacer placement, and T1-T4 posterior fusion. 3. No acute fractures. 4. Please see separately reported CT chest exam, describing multifocal bilateral pneumonia. Electronically Signed   By: Randa Ngo M.D.   On: 06/29/2022 16:51   CT Angio Chest PE W/Cm &/Or Wo Cm  Result Date: 06/29/2022 CLINICAL DATA:  Dyspnea, hypoxia, recently diagnosed DVT,  history of multiple myeloma EXAM: CT ANGIOGRAPHY CHEST WITH CONTRAST TECHNIQUE: Multidetector CT imaging of the chest was performed using the standard protocol during bolus administration of intravenous contrast. Multiplanar CT image reconstructions and MIPs were obtained to evaluate the vascular anatomy. RADIATION DOSE REDUCTION: This exam was performed according to the departmental dose-optimization program which includes automated exposure control, adjustment of the mA and/or kV according to patient size and/or use of iterative reconstruction technique. CONTRAST:  51mL OMNIPAQUE IOHEXOL 350 MG/ML SOLN COMPARISON:  03/15/2022, 06/03/2022 FINDINGS: Cardiovascular: This is a technically adequate evaluation of the pulmonary vasculature. No filling defects or pulmonary emboli. Mild  cardiomegaly without pericardial effusion. No evidence of thoracic aortic aneurysm or dissection. Mediastinum/Nodes: Mediastinal and hilar adenopathy identified. Index lymph node in the subcarinal region measures 16 mm. Thyroid, trachea, and esophagus are unremarkable. Lungs/Pleura: The widespread bilateral airspace disease seen on earlier chest x-ray is again noted, most pronounced within the right upper lobe. There are small bilateral pleural effusions, right greater than left. No pneumothorax. The central airways are patent. Upper Abdomen: No acute abnormality. Musculoskeletal: Lytic lesions within the right posterior sixth rib, T11 spinous process, and left L1 pedicle are stable, consistent with known multiple myeloma. Interval corpectomy and spacer placement at the T2 level, with posterior fusion spanning T1 through T4. Reconstructed images demonstrate no additional findings. Review of the MIP images confirms the above findings. IMPRESSION: 1. No evidence of pulmonary embolus. 2. Multifocal bilateral airspace disease consistent with bilateral pneumonia. 3. Small bilateral pleural effusions. 4. Mediastinal and hilar adenopathy, likely reactive. 5. Stable lytic lesions within the spine and thoracic cage consistent with known multiple myeloma. 6. Interval postsurgical changes at the T2 level from prior corpectomy and spacer placement. Electronically Signed   By: Randa Ngo M.D.   On: 06/29/2022 16:47   DG Chest 2 View  Result Date: 06/29/2022 CLINICAL DATA:  Shortness of breath beginning this morning. EXAM: CHEST - 2 VIEW COMPARISON:  Chest radiographs 06/21/2022 in 03/10/2022 FINDINGS: Cardiac silhouette and mediastinal contours are partially obscured by overlying lung opacities are grossly unremarkable. There are dense heterogeneous airspace opacities which in the right mid lung and left mid and lower lung that are new from 06/21/2022 most recent comparison. Minimal posterior costophrenic angle blunting  on lateral view, tiny pleural effusion at least on the left side and possibly the right side. No pneumothorax is seen. Moderate multilevel degenerative disc changes of the thoracic spine. Lower cervical and upper thoracic partially visualized rod and screw fusion hardware there is again noted. IMPRESSION: Dense heterogeneous airspace opacities which in the right mid lung and left mid and lower lung that are new from 06/21/2022 most recent comparison. Findings are suspicious for multifocal pneumonia. Electronically Signed   By: Yvonne Kendall M.D.   On: 06/29/2022 14:59   ECHOCARDIOGRAM COMPLETE  Result Date: 06/22/2022    ECHOCARDIOGRAM REPORT   Patient Name:   TACOREY COTRONE [facility] Date of Exam: 06/22/2022 Medical Rec #:  [id number]        Height:       70.0 in Accession #:    [id number]       Weight:       244.3 lb Date of Birth:  02-Feb-1951       BSA:          2.272 m Patient Age:    72 years         BP:           132/74 mmHg Patient  Gender: M                HR:           65 bpm. Exam Location:  Inpatient Procedure: 2D Echo, Color Doppler, Cardiac Doppler and Intracardiac            Opacification Agent Indications:    I48.91* Unspeicified atrial fibrillation  History:        Patient has prior history of Echocardiogram examinations, most                 recent 09/01/2021. Abnormal ECG, Arrythmias:Atrial Fibrillation,                 Signs/Symptoms:Chest Pain and Edema; Risk Factors:Hypertension                 and Sleep Apnea. Chemotherapy. Myeloma.  Sonographer:    Roseanna Rainbow RDCS Referring Phys: [id number] MIR M [facility]  Sonographer Comments: Technically difficult study due to poor echo windows and patient is obese. Image acquisition challenging due to patient body habitus. Patient in fowler's position. Patient post spinal surgery 2/2. IMPRESSIONS  1. Appears to be in sinus rhythm at the time of evaluation- A waves present on diastology. Left ventricular ejection fraction, by estimation, is 60 to 65%. The left  ventricle has normal function. The left ventricle has no regional wall motion abnormalities. There is mild concentric left ventricular hypertrophy. Left ventricular diastolic parameters are consistent with Grade II diastolic dysfunction (pseudonormalization).  2. Right ventricular systolic function is normal. The right ventricular size is normal. There is normal pulmonary artery systolic pressure.  3. The mitral valve is grossly normal. No evidence of mitral valve regurgitation.  4. The aortic valve is tricuspid. Aortic valve regurgitation is not visualized. No aortic stenosis is present.  5. The inferior vena cava is normal in size with greater than 50% respiratory variability, suggesting right atrial pressure of 3 mmHg. FINDINGS  Left Ventricle: Appears to be in sinus rhythm at the time of evaluation- A waves present on diastology. Left ventricular ejection fraction, by estimation, is 60 to 65%. The left ventricle has normal function. The left ventricle has no regional wall motion abnormalities. Definity contrast agent was given IV to delineate the left ventricular endocardial borders. The left ventricular internal cavity size was normal in size. There is mild concentric left ventricular hypertrophy. Left ventricular diastolic parameters are consistent with Grade II diastolic dysfunction (pseudonormalization). Right Ventricle: The right ventricular size is normal. No increase in right ventricular wall thickness. Right ventricular systolic function is normal. There is normal pulmonary artery systolic pressure. The tricuspid regurgitant velocity is 2.33 m/s, and  with an assumed right atrial pressure of 3 mmHg, the estimated right ventricular systolic pressure is [id number] mmHg. Left Atrium: Left atrial size was normal in size. Right Atrium: Right atrial size was normal in size. Pericardium: There is no evidence of pericardial effusion. Mitral Valve: The mitral valve is grossly normal. No evidence of mitral valve  regurgitation. Tricuspid Valve: The tricuspid valve is normal in structure. Tricuspid valve regurgitation is not demonstrated. No evidence of tricuspid stenosis. Aortic Valve: The aortic valve is tricuspid. Aortic valve regurgitation is not visualized. No aortic stenosis is present. Pulmonic Valve: The pulmonic valve was grossly normal. Pulmonic valve regurgitation is not visualized. Aorta: The aortic root and ascending aorta are structurally normal, with no evidence of dilitation. Venous: The inferior vena cava is normal in size with greater than 50% respiratory variability, suggesting right atrial  pressure of 3 mmHg. IAS/Shunts: No atrial level shunt detected by color flow Doppler.  LEFT VENTRICLE PLAX 2D LVIDd:         5.20 cm   Diastology LVIDs:         3.30 cm   LV e' medial:    6.53 cm/s LV PW:         1.20 cm   LV E/e' medial:  15.5 LV IVS:        1.20 cm   LV e' lateral:   11.60 cm/s LVOT diam:     2.60 cm   LV E/e' lateral: 8.7 LV SV:         133 LV SV Index:   58 LVOT Area:     5.31 cm  RIGHT VENTRICLE             IVC RV S prime:     18.80 cm/s  IVC diam: 2.60 cm TAPSE (M-mode): 3.0 cm LEFT ATRIUM             Index        RIGHT ATRIUM           Index LA diam:        4.30 cm 1.89 cm/m   RA Area:     20.90 cm LA Vol (A2C):   84.9 ml 37.36 ml/m  RA Volume:   60.10 ml  26.45 ml/m LA Vol (A4C):   66.4 ml 29.22 ml/m LA Biplane Vol: 74.9 ml 32.96 ml/m  AORTIC VALVE LVOT Vmax:   127.00 cm/s LVOT Vmean:  84.000 cm/s LVOT VTI:    0.250 m  AORTA Ao Root diam: 3.70 cm Ao Asc diam:  3.70 cm MITRAL VALVE                TRICUSPID VALVE MV Area (PHT): 4.21 cm     TR Peak grad:   21.7 mmHg MV Decel Time: 180 msec     TR Vmax:        233.00 cm/s MV E velocity: 101.00 cm/s MV A velocity: 54.00 cm/s   SHUNTS MV E/A ratio:  1.87         Systemic VTI:  0.25 m                             Systemic Diam: 2.60 cm Rudean Haskell MD Electronically signed by Rudean Haskell MD Signature Date/Time: 06/22/2022/9:37:19  AM    Final     ASSESSMENT & PLAN Travis [name] is a 72 y.o. male with medical history significant for IgA Kappa Multiple Myeloma who presents for a follow up visit.   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  He meets diagnostic criteria by having a serum free light chain ratio greater than 100 and lytic lesions causing pathological fracture of the humerus.  Given this I would recommend we pursued Darzalex, Revlimid, and dexamethasone.  Given his advanced age I would not recommend transplantation, that this is something we can consider when it comes time to transition to maintenance therapy versus transplant.  The patient voices understanding of the plan moving forward.  # IgA Kappa Multiple Myeloma --Metastatic survey completed 12/15/2021 --Patient underwent fixation of his humerus on 12/17/2021, pathology confirmed plasmacytoma/plasma cell neoplasm.  --Received 6 cycles of Darzalex, Revlimid, and dexamethasone started on 12/28/2021-05/18/2022.  --Due to worsening myeloma osseous  lesions including pathologic fracture at T2 with cord compression, Dr. Lorenso Courier recommends changing therapy to Velcade/Pomalyst/Dex.  Plan: --Due for Cycle 1, Day 15 of Velcade/Pom/Dex today --labs from today showed white blood cell 7.9, hemoglobin 10.6, MCV 102.5, and platelets of 136 --restaging labs from 06/23/2022 with SPEP and SFLC showed M protein not detectable. Kappa decreased to 138.1 (from 233.8), Lambda 3.4, Ratio 40.62.  --Proceed with treatment today without any dose modifications.  --RTC in 2 weeks with interval weekly treatments  #Palliative Radiation Therapy: --Received palliative radiation from 01/06/2022-01/19/2022 to right hymerus, right forearm and right chest/rib, 20 Gy in 10 Fx.  --Started palliative radiation to surgical bed in the upper thoracic spine area on 07/13/2022. Planned 27 Gy in 9 Fx.   #  Rash Concerning for Shingles- improving -- Prescribed valacyclovir 3 times daily 1000 mg x 10 days-completed the course -- We will continue to monitor rash closely.  #DVT: --Found to have acute DVT involving left peroneal veins. Likely secondary to recent surgery and hospitalization --Currently on  5 mg BID.   #Hypokalemia: --Currently on potassium chloride 40 mEq twice daily as prescribed. Potassium level is normal today.   #Supportive Care -- chemotherapy education complete -- port placement not required -- zofran 8mg  q8H PRN and compazine 10mg  PO q6H for nausea -- acyclovir 400mg  PO BID for VCZ prophylaxis -- Started Zometa q 12 weeks on 05/18/2022. Next dose April 2024.    No orders of the defined types were placed in this encounter.   All questions were answered. The patient knows to call the clinic with any problems, questions or concerns.  A total of more than 30 minutes were spent on this encounter with face-to-face time and non-face-to-face time, including preparing to see the patient, ordering tests and/or medications, counseling the patient and coordination of care as outlined above.   Dede Query PA-C Dept of Hematology and [place] at [facility] Phone: [phone]   07/21/2022 2:03 PM

## 2022-07-22 ENCOUNTER — Other Ambulatory Visit: Payer: Self-pay

## 2022-07-22 ENCOUNTER — Ambulatory Visit
Admission: RE | Admit: 2022-07-22 | Discharge: 2022-07-22 | Disposition: A | Discharge status: Home / Self Care | Payer: Medicare Other | Source: Ambulatory Visit | Attending: Radiation Oncology | Admitting: Radiation Oncology

## 2022-07-22 DIAGNOSIS — M8458XA Pathological fracture in neoplastic disease, other specified site, initial encounter for fracture: Secondary | ICD-10-CM | POA: Diagnosis not present

## 2022-07-22 DIAGNOSIS — Z86718 Personal history of other venous thrombosis and embolism: Secondary | ICD-10-CM | POA: Diagnosis not present

## 2022-07-22 DIAGNOSIS — Z4789 Encounter for other orthopedic aftercare: Secondary | ICD-10-CM | POA: Diagnosis not present

## 2022-07-22 DIAGNOSIS — I480 Paroxysmal atrial fibrillation: Secondary | ICD-10-CM | POA: Diagnosis not present

## 2022-07-22 DIAGNOSIS — M1990 Unspecified osteoarthritis, unspecified site: Secondary | ICD-10-CM | POA: Diagnosis not present

## 2022-07-22 DIAGNOSIS — Z510 Encounter for antineoplastic radiation therapy: Secondary | ICD-10-CM | POA: Diagnosis not present

## 2022-07-22 DIAGNOSIS — Z7901 Long term (current) use of anticoagulants: Secondary | ICD-10-CM | POA: Diagnosis not present

## 2022-07-22 DIAGNOSIS — Z5112 Encounter for antineoplastic immunotherapy: Secondary | ICD-10-CM | POA: Diagnosis not present

## 2022-07-22 DIAGNOSIS — E876 Hypokalemia: Secondary | ICD-10-CM | POA: Diagnosis not present

## 2022-07-22 DIAGNOSIS — G629 Polyneuropathy, unspecified: Secondary | ICD-10-CM | POA: Diagnosis not present

## 2022-07-22 DIAGNOSIS — Z923 Personal history of irradiation: Secondary | ICD-10-CM | POA: Diagnosis not present

## 2022-07-22 DIAGNOSIS — G4733 Obstructive sleep apnea (adult) (pediatric): Secondary | ICD-10-CM | POA: Diagnosis not present

## 2022-07-22 DIAGNOSIS — Z981 Arthrodesis status: Secondary | ICD-10-CM | POA: Diagnosis not present

## 2022-07-22 DIAGNOSIS — E559 Vitamin D deficiency, unspecified: Secondary | ICD-10-CM | POA: Diagnosis not present

## 2022-07-22 DIAGNOSIS — I119 Hypertensive heart disease without heart failure: Secondary | ICD-10-CM | POA: Diagnosis not present

## 2022-07-22 DIAGNOSIS — C9000 Multiple myeloma not having achieved remission: Secondary | ICD-10-CM | POA: Diagnosis not present

## 2022-07-22 LAB — RAD ONC ARIA SESSION SUMMARY
Course Elapsed Days: 9
Plan Fractions Treated to Date: 8
Plan Prescribed Dose Per Fraction: 3 Gy
Plan Total Fractions Prescribed: 9
Plan Total Prescribed Dose: 27 Gy
Reference Point Dosage Given to Date: 24 Gy
Reference Point Session Dosage Given: 3 Gy
Session Number: 8

## 2022-07-23 ENCOUNTER — Ambulatory Visit
Admission: RE | Admit: 2022-07-23 | Discharge: 2022-07-23 | Disposition: A | Discharge status: Home / Self Care | Payer: Medicare Other | Source: Ambulatory Visit | Attending: Radiation Oncology | Admitting: Radiation Oncology

## 2022-07-23 ENCOUNTER — Ambulatory Visit: Payer: Medicare Other

## 2022-07-23 ENCOUNTER — Other Ambulatory Visit: Payer: Self-pay

## 2022-07-23 ENCOUNTER — Encounter: Payer: Self-pay | Admitting: Radiation Oncology

## 2022-07-23 DIAGNOSIS — I4891 Unspecified atrial fibrillation: Secondary | ICD-10-CM | POA: Diagnosis not present

## 2022-07-23 DIAGNOSIS — Z923 Personal history of irradiation: Secondary | ICD-10-CM | POA: Diagnosis not present

## 2022-07-23 DIAGNOSIS — I5032 Chronic diastolic (congestive) heart failure: Secondary | ICD-10-CM | POA: Diagnosis not present

## 2022-07-23 DIAGNOSIS — Z510 Encounter for antineoplastic radiation therapy: Secondary | ICD-10-CM | POA: Diagnosis not present

## 2022-07-23 DIAGNOSIS — I480 Paroxysmal atrial fibrillation: Secondary | ICD-10-CM | POA: Diagnosis not present

## 2022-07-23 DIAGNOSIS — Z7901 Long term (current) use of anticoagulants: Secondary | ICD-10-CM | POA: Diagnosis not present

## 2022-07-23 DIAGNOSIS — E7800 Pure hypercholesterolemia, unspecified: Secondary | ICD-10-CM | POA: Diagnosis not present

## 2022-07-23 DIAGNOSIS — E876 Hypokalemia: Secondary | ICD-10-CM | POA: Diagnosis not present

## 2022-07-23 DIAGNOSIS — M8458XA Pathological fracture in neoplastic disease, other specified site, initial encounter for fracture: Secondary | ICD-10-CM | POA: Diagnosis not present

## 2022-07-23 DIAGNOSIS — M1611 Unilateral primary osteoarthritis, right hip: Secondary | ICD-10-CM | POA: Diagnosis not present

## 2022-07-23 DIAGNOSIS — Z5112 Encounter for antineoplastic immunotherapy: Secondary | ICD-10-CM | POA: Diagnosis not present

## 2022-07-23 DIAGNOSIS — I10 Essential (primary) hypertension: Secondary | ICD-10-CM | POA: Diagnosis not present

## 2022-07-23 DIAGNOSIS — C9000 Multiple myeloma not having achieved remission: Secondary | ICD-10-CM | POA: Diagnosis not present

## 2022-07-23 DIAGNOSIS — Z86718 Personal history of other venous thrombosis and embolism: Secondary | ICD-10-CM | POA: Diagnosis not present

## 2022-07-23 LAB — RAD ONC ARIA SESSION SUMMARY
Course Elapsed Days: 10
Plan Fractions Treated to Date: 9
Plan Prescribed Dose Per Fraction: 3 Gy
Plan Total Fractions Prescribed: 9
Plan Total Prescribed Dose: 27 Gy
Reference Point Dosage Given to Date: 27 Gy
Reference Point Session Dosage Given: 3 Gy
Session Number: 9

## 2022-07-23 NOTE — Radiation Completion Notes (Signed)
Patient Name: [name], Travis [name] MRN: [id number] Date of Birth: 11/21/1950 Referring Physician: Nicholas Lose, M.D. Date of Service: 2022-07-23 Radiation Oncologist: Ledon Snare, M.D. [facility] END OF TREATMENT NOTE     Diagnosis: C90.00 Multiple myeloma not having achieved remission Intent: Palliative     ==========DELIVERED PLANS==========  First Treatment Date: 2022-07-13 - Last Treatment Date: 2022-07-23   Plan Name: Spine_T Site: Thoracic Spine Technique: 3D Mode: Photon Dose Per Fraction: 3 Gy Prescribed Dose (Delivered / Prescribed): 27 Gy / 27 Gy Prescribed Fxs (Delivered / Prescribed): 9 / 9     ==========ON TREATMENT VISIT DATES========== 2022-07-13, 2022-07-20     ==========UPCOMING VISITS========== 2024-04-02T14:00:00Z [facility] Pixie Casino, MD; Pixie Casino, MD        ==========APPENDIX - ON TREATMENT VISIT NOTES==========   See weekly On Treatment Notes is Epic for details.

## 2022-07-26 ENCOUNTER — Ambulatory Visit: Payer: Medicare Other

## 2022-07-26 ENCOUNTER — Telehealth: Payer: Self-pay

## 2022-07-26 NOTE — Telephone Encounter (Signed)
Called in prescription per Dr. Isidore Moos for Sucralfate 1g- dissolve 1 tablet in 69ml of water and swallow 30 mins prior to meals up to QID. #40 no refills.   My chart message sent to patient to make aware.

## 2022-07-27 DIAGNOSIS — I119 Hypertensive heart disease without heart failure: Secondary | ICD-10-CM | POA: Diagnosis not present

## 2022-07-27 DIAGNOSIS — R0981 Nasal congestion: Secondary | ICD-10-CM | POA: Diagnosis not present

## 2022-07-27 DIAGNOSIS — Z9989 Dependence on other enabling machines and devices: Secondary | ICD-10-CM | POA: Diagnosis not present

## 2022-07-27 DIAGNOSIS — G4733 Obstructive sleep apnea (adult) (pediatric): Secondary | ICD-10-CM | POA: Diagnosis not present

## 2022-07-27 DIAGNOSIS — E559 Vitamin D deficiency, unspecified: Secondary | ICD-10-CM | POA: Diagnosis not present

## 2022-07-27 DIAGNOSIS — Z981 Arthrodesis status: Secondary | ICD-10-CM | POA: Diagnosis not present

## 2022-07-27 DIAGNOSIS — M1990 Unspecified osteoarthritis, unspecified site: Secondary | ICD-10-CM | POA: Diagnosis not present

## 2022-07-27 DIAGNOSIS — I480 Paroxysmal atrial fibrillation: Secondary | ICD-10-CM | POA: Diagnosis not present

## 2022-07-27 DIAGNOSIS — G629 Polyneuropathy, unspecified: Secondary | ICD-10-CM | POA: Diagnosis not present

## 2022-07-27 DIAGNOSIS — Z4789 Encounter for other orthopedic aftercare: Secondary | ICD-10-CM | POA: Diagnosis not present

## 2022-07-27 DIAGNOSIS — C9000 Multiple myeloma not having achieved remission: Secondary | ICD-10-CM | POA: Diagnosis not present

## 2022-07-27 DIAGNOSIS — B370 Candidal stomatitis: Secondary | ICD-10-CM | POA: Diagnosis not present

## 2022-07-27 NOTE — Progress Notes (Unsigned)
a   Cardiology Office Note   Date:  07/29/2022   ID:  Travis [name], Travis [name] 1950-05-30, MRN [id number]  PCP:  [name], MD  Cardiologist:   [name], MD    Patient presents for follow up of Atrial fib     History of Present Illness: Travis [name] is a 72 y.o. male with a history of  HTN, OSA and PAF (CHADSVASc score 4; s/p Afib ablation 2018 ).   The pt had Cardiac CTA in 2018 which showed no CAD  Calcium score was 0  Pt also has hx of DVT, peripheral neuropathy and Multiple Myeloma   Fall 2023 he was hospitalized in [facility] for afib / flutter  Volume overloaded at time    Diuresed   Converted to SR  B blocker stopped due to bradycardia then resumed      Hospitalized for tachycardia   atrial fib/fltutter   Echo done as well  On 03/15/22 she was seen in ER with afib and atypcial CP  EKG showed SR   Trop checked neg   CT of chest showed no PE   Did show lytic lesions L sixth rib      Pt given lidocaine patch.    The pt was seen in afib clinc on 03/17/22   IN SR   Denied CP   Appt made with [name] in Jan 2024  I saw the pt in Dec 2023  Back in afib   Recomm stopping flecanided and staring amiodarone     BP was high  Hydrodiuril stopped due to low K   Recomm hydralazine 10 as tid Increaed to 20 tid     Seen by Grayce Sessions in Jan 2024  Recomm decreasing amiodarone dose to 200 daily    Increase hydralazine to 37.5 tid    \  The pt was hospitalized in march 2024  Since seen he says palpitations /tachcardia has resolved   His breathing is OK   No dzziness NOw complains of R sided  shoulder/arm pain     He has also noticed some cramping in L chest     I saw the pt in clinic in Dec 2023   He was seen by Grayce Sessions since  Allergies:   Linezolid and Vancomycin   Past Medical History:  Diagnosis Date   A-fib [facility])    Arthritis    "comes and goes; mostly in hands, occasionally elbow" (0000000)   Complication of anesthesia    "just wanted to sleep alot  for couple days S/P  VARICOCELE OR" (04/05/2017)   DVT (deep venous thrombosis) (HCC)    LLE   Dysrhythmia    PVCs    GERD (gastroesophageal reflux disease)    History of hiatal hernia    Hypertension    Impaired glucose tolerance    Multiple myeloma ([place])    Obesity (BMI 30-39.9) 07/26/2016   OSA on CPAP 07/26/2016   Mild with AHI 12.5/hr now on CPAP at 14cm H2O   PAF (paroxysmal atrial fibrillation) ([place])    s/p PVI Ablation in 2018 // Flecainide Rx // Echo 8/21: EF 60-65, no RWMA, mild LVH, normal RV SF, moderate RVE, mild BAE, trivial MR, mild dilation of aortic root (40 mm)   Pneumonia    "may have had walking pneumonia a few years ago" (04/05/2017)   Pre-diabetes    Thrombophlebitis     Past Surgical History:  Procedure Laterality Date   ATRIAL FIBRILLATION ABLATION  04/05/2017   ATRIAL FIBRILLATION ABLATION N/A 04/05/2017   Procedure: ATRIAL FIBRILLATION ABLATION;  Surgeon: Thompson Grayer, MD;  Location: [place] [facility];  Service: Cardiovascular;  Laterality: N/A;   BONE BIOPSY Right 12/17/2021   Procedure: BONE BIOPSY;  Surgeon: Hiram Gash, MD;  Location: [place];  Service: Orthopedics;  Laterality: Right;   COMPLETE RIGHT HIP REPLACMENT  01/19/2019   EVALUATION UNDER ANESTHESIA WITH HEMORRHOIDECTOMY N/A 02/24/2017   Procedure: EXAM UNDER ANESTHESIA WITH  HEMORRHOIDECTOMY;  Surgeon: Michael Boston, MD;  Location: [facility];  Service: General;  Laterality: N/A;   EXCISIONAL TOTAL HIP ARTHROPLASTY WITH ANTIBIOTIC SPACERS Right 09/25/2020   Procedure: EXCISIONAL TOTAL HIP ARTHROPLASTY WITH ANTIBIOTIC SPACERS;  Surgeon: Paralee Cancel, MD;  Location: [facility];  Service: Orthopedics;  Laterality: Right;   FLEXIBLE SIGMOIDOSCOPY N/A 04/15/2015   Procedure:  UNSEDATED FLEXIBLE SIGMOIDOSCOPY;  Surgeon: [name], MD;  [facility];  Service: Endoscopy;  Laterality: N/A;   HUMERUS IM NAIL Right 12/17/2021   Procedure: INTRAMEDULLARY (IM) NAIL HUMERAL;  Surgeon: Hiram Gash,  MD;  Location: [place];  Service: Orthopedics;  Laterality: Right;   KNEE ARTHROSCOPY Left ~ 2016   POSTERIOR CERVICAL FUSION/FORAMINOTOMY N/A 06/04/2022   Procedure: Thoracic one - Thoracic Four Posterior lateral arthrodesis/ fusion and Thoracic two Corpectomy;  Surgeon: Ashok Pall, MD;  Location: [place];  Service: Neurosurgery;  Laterality: N/A;   REIMPLANTATION OF TOTAL HIP Right 12/25/2020   Procedure: REIMPLANTATION/REVISION OF RIGHT TOTAL HIP VERSUS REPEAT IRRIGATION AND DEBRIDEMENT;  Surgeon: Paralee Cancel, MD;  Location: [facility];  Service: Orthopedics;  Laterality: Right;   TESTICLE SURGERY  1988   VARICOCELE   TONSILLECTOMY     VARICOSE VEIN SURGERY Bilateral      Social History:  The patient  reports that he has never smoked. He has never used smokeless tobacco. He reports current alcohol use of about 2.0 standard drinks of alcohol per week. He reports that he does not use drugs.   Family History:  The patient's family history includes Atrial fibrillation in his father; Dementia in his maternal grandmother and mother; Hypertension in his father and paternal grandfather; Stroke in his father.    ROS:  Please see the history of present illness. All other systems are reviewed and  Negative to the above problem except as noted.    PHYSICAL EXAM: VS:  BP 122/66   Pulse (!) 55   Ht 5\' 10"  (1.778 m)   Wt 241 lb 12.8 oz (109.7 kg)   SpO2 94%   BMI 34.69 kg/m   GEN: Obese 72 yo in no acute distress  HEENT: normal  Neck:  Neck ful   No JVD Cardiac: RRR; no murmur   Trivial  LE edema    Respiratory:  clear to auscultation  GI: Supple     No hepatomegaly   EKG:  EKG is ordered today   SInus bradycardia  52 bpm      Lipid Panel No results found for: "CHOL", "TRIG", "HDL", "CHOLHDL", "VLDL", "LDLCALC", "LDLDIRECT"    Wt Readings from Last 3 Encounters:  07/29/22 241 lb 12.8 oz (109.7 kg)  07/28/22 238 lb 12 oz (108.3 kg)  07/21/22 247 lb 3.2 oz (112.1 kg)       ASSESSMENT AND PLAN:   Atrial flutter/fib.  Pt is back in SR    Had a recurrence earlier in March 2024   On amiodarone   Continue   Cancel cardioversion.  Cut back on b blocker    Follow HR   Keep on ELiquis     2  CP   Pt denies symptoms    3  HTN  BP is controlled  Continue   Cut back on metoprolol to 25 bid   Keep on this for now    4  Hx of Bradycardia   Cut back metoprolol     6.  Lipids.  Excellent     LDL 61, HDL  7.  OSA.   Continue CPAP  Follow up in Jan with EP   Current medicines are reviewed at length with the patient today.  The patient does not have concerns regarding medicines.  Signed, [name], MD  07/29/2022 5:53 PM    [address], [place], [place]  [address] Phone: [phone]; Fax: [phone]

## 2022-07-28 ENCOUNTER — Inpatient Hospital Stay: Payer: Medicare Other

## 2022-07-28 ENCOUNTER — Telehealth: Payer: Self-pay

## 2022-07-28 ENCOUNTER — Other Ambulatory Visit: Payer: Self-pay | Admitting: Radiation Oncology

## 2022-07-28 ENCOUNTER — Other Ambulatory Visit: Payer: Self-pay

## 2022-07-28 VITALS — BP 123/72 | HR 54 | Temp 98.2°F | Resp 18 | Wt 238.8 lb

## 2022-07-28 DIAGNOSIS — Z510 Encounter for antineoplastic radiation therapy: Secondary | ICD-10-CM | POA: Diagnosis not present

## 2022-07-28 DIAGNOSIS — Z86718 Personal history of other venous thrombosis and embolism: Secondary | ICD-10-CM | POA: Diagnosis not present

## 2022-07-28 DIAGNOSIS — C9000 Multiple myeloma not having achieved remission: Secondary | ICD-10-CM

## 2022-07-28 DIAGNOSIS — Z5112 Encounter for antineoplastic immunotherapy: Secondary | ICD-10-CM | POA: Diagnosis not present

## 2022-07-28 DIAGNOSIS — E876 Hypokalemia: Secondary | ICD-10-CM | POA: Diagnosis not present

## 2022-07-28 DIAGNOSIS — Z923 Personal history of irradiation: Secondary | ICD-10-CM | POA: Diagnosis not present

## 2022-07-28 DIAGNOSIS — Z7901 Long term (current) use of anticoagulants: Secondary | ICD-10-CM | POA: Diagnosis not present

## 2022-07-28 DIAGNOSIS — M8458XA Pathological fracture in neoplastic disease, other specified site, initial encounter for fracture: Secondary | ICD-10-CM | POA: Diagnosis not present

## 2022-07-28 LAB — CMP (CANCER CENTER ONLY)
ALT: 26 U/L (ref 0–44)
AST: 8 U/L — ABNORMAL LOW (ref 15–41)
Albumin: 3.5 g/dL (ref 3.5–5.0)
Alkaline Phosphatase: 63 U/L (ref 38–126)
Anion gap: 7 (ref 5–15)
BUN: 23 mg/dL (ref 8–23)
CO2: 28 mmol/L (ref 22–32)
Calcium: 8.4 mg/dL — ABNORMAL LOW (ref 8.9–10.3)
Chloride: 108 mmol/L (ref 98–111)
Creatinine: 1.14 mg/dL (ref 0.61–1.24)
GFR, Estimated: >60 mL/min (ref >60)
Glucose, Bld: 112 mg/dL — ABNORMAL HIGH (ref 70–99)
Potassium: 3.9 mmol/L (ref 3.5–5.1)
Sodium: 143 mmol/L (ref 135–145)
Total Bilirubin: 0.8 mg/dL (ref 0.3–1.2)
Total Protein: 5.7 g/dL — ABNORMAL LOW (ref 6.5–8.1)

## 2022-07-28 LAB — CBC WITH DIFFERENTIAL (CANCER CENTER ONLY)
Abs Immature Granulocytes: 0.15 10*3/uL — ABNORMAL HIGH (ref 0.00–0.07)
Basophils Absolute: 0 10*3/uL (ref 0.0–0.1)
Basophils Relative: 0 %
Eosinophils Absolute: 0.1 10*3/uL (ref 0.0–0.5)
Eosinophils Relative: 3 %
HCT: 33.4 % — ABNORMAL LOW (ref 39.0–52.0)
Hemoglobin: 10.7 g/dL — ABNORMAL LOW (ref 13.0–17.0)
Immature Granulocytes: 4 %
Lymphocytes Relative: 13 %
Lymphs Abs: 0.5 10*3/uL — ABNORMAL LOW (ref 0.7–4.0)
MCH: 33 pg (ref 26.0–34.0)
MCHC: 32 g/dL (ref 30.0–36.0)
MCV: 103.1 fL — ABNORMAL HIGH (ref 80.0–100.0)
Monocytes Absolute: 0.5 10*3/uL (ref 0.1–1.0)
Monocytes Relative: 12 %
Neutro Abs: 2.7 10*3/uL (ref 1.7–7.7)
Neutrophils Relative %: 68 %
Platelet Count: 92 10*3/uL — ABNORMAL LOW (ref 150–400)
RBC: 3.24 MIL/uL — ABNORMAL LOW (ref 4.22–5.81)
RDW: 18 % — ABNORMAL HIGH (ref 11.5–15.5)
Smear Review: NORMAL
WBC Count: 4 10*3/uL (ref 4.0–10.5)
nRBC: 0 % (ref 0.0–0.2)

## 2022-07-28 MED ORDER — DEXAMETHASONE 4 MG PO TABS
40.0000 mg | ORAL_TABLET | Freq: Once | ORAL | Status: AC
  Administered 2022-07-28: 40 mg via ORAL
  Filled 2022-07-28: qty 10

## 2022-07-28 MED ORDER — LIDOCAINE VISCOUS HCL 2 % MT SOLN: 15.0000 mL | Freq: Three times a day (TID) | OROMUCOSAL | 1 refills | Status: DC

## 2022-07-28 MED ORDER — BORTEZOMIB CHEMO SQ INJECTION 3.5 MG (2.5MG/ML)
1.3000 mg/m2 | Freq: Once | INTRAMUSCULAR | Status: AC
  Administered 2022-07-28: 3 mg via SUBCUTANEOUS
  Filled 2022-07-28: qty 1.2

## 2022-07-28 NOTE — Patient Instructions (Signed)
[facility] [place] [facility]  Discharge Instructions: Thank you for choosing [facility] to provide your oncology and hematology care.   If you have a lab appointment with the Cancer Center, please go directly to the Cancer Center and check in at the registration area.   Wear comfortable clothing and clothing appropriate for easy access to any Portacath or PICC line.   We strive to give you quality time with your provider. You may need to reschedule your appointment if you arrive late (15 or more minutes).  Arriving late affects you and other patients whose appointments are after yours.  Also, if you miss three or more appointments without notifying the office, you may be dismissed from the clinic at the provider's discretion.      For prescription refill requests, have your pharmacy contact our office and allow 72 hours for refills to be completed.    Today you received the following chemotherapy and/or immunotherapy agents: Velcade     To help prevent nausea and vomiting after your treatment, we encourage you to take your nausea medication as directed.  BELOW ARE SYMPTOMS THAT SHOULD BE REPORTED IMMEDIATELY: *FEVER GREATER THAN 100.4 F (38 C) OR HIGHER *CHILLS OR SWEATING *NAUSEA AND VOMITING THAT IS NOT CONTROLLED WITH YOUR NAUSEA MEDICATION *UNUSUAL SHORTNESS OF BREATH *UNUSUAL BRUISING OR BLEEDING *URINARY PROBLEMS (pain or burning when urinating, or frequent urination) *BOWEL PROBLEMS (unusual diarrhea, constipation, pain near the anus) TENDERNESS IN MOUTH AND THROAT WITH OR WITHOUT PRESENCE OF ULCERS (sore throat, sores in mouth, or a toothache) UNUSUAL RASH, SWELLING OR PAIN  UNUSUAL VAGINAL DISCHARGE OR ITCHING   Items with * indicate a potential emergency and should be followed up as soon as possible or go to the Emergency Department if any problems should occur.  Please show the CHEMOTHERAPY ALERT CARD or IMMUNOTHERAPY ALERT CARD at check-in  to the Emergency Department and triage nurse.  Should you have questions after your visit or need to cancel or reschedule your appointment, please contact [place] [place] [facility]  Dept: [phone]  and follow the prompts.  Office hours are 8:00 a.m. to 4:30 p.m. Monday - Friday. Please note that voicemails left after 4:00 p.m. may not be returned until the following business day.  We are closed weekends and major holidays. You have access to a nurse at all times for urgent questions. Please call the main number to the clinic Dept: [phone] and follow the prompts.   For any non-urgent questions, you may also contact your provider using MyChart. We now offer e-Visits for anyone 18 and older to request care online for non-urgent symptoms. For details visit [email or web address].   Also download the MyChart app! Go to the app store, search "MyChart", open the app, select , and log in with your MyChart username and password.   

## 2022-07-28 NOTE — Telephone Encounter (Signed)
Patient called, identity verified.  Patient requesting a RX for a severe/ swollen throat 10/10. No breathing difficulties at this time but patient is having dysphagia w/ eating and drinking. Reviewed ER protocol w/ patient in reference to any possible distress. Patient verbalized understanding.  Dr. Sondra Come, MD notified of patient concern.  This concludes the conversation.   Leandra Kern, LPN

## 2022-07-28 NOTE — Progress Notes (Signed)
Received verbal order:  OK to treat with Platelet count of 92 K from Dr Lorenso Courier

## 2022-07-29 ENCOUNTER — Ambulatory Visit: Payer: Medicare Other | Attending: Internal Medicine | Admitting: Internal Medicine

## 2022-07-29 ENCOUNTER — Encounter: Payer: Self-pay | Admitting: Internal Medicine

## 2022-07-29 VITALS — BP 122/66 | HR 55 | Ht 70.0 in | Wt 241.8 lb

## 2022-07-29 DIAGNOSIS — I480 Paroxysmal atrial fibrillation: Secondary | ICD-10-CM | POA: Diagnosis not present

## 2022-07-29 DIAGNOSIS — I119 Hypertensive heart disease without heart failure: Secondary | ICD-10-CM | POA: Diagnosis not present

## 2022-07-29 DIAGNOSIS — E559 Vitamin D deficiency, unspecified: Secondary | ICD-10-CM | POA: Diagnosis not present

## 2022-07-29 DIAGNOSIS — Z981 Arthrodesis status: Secondary | ICD-10-CM | POA: Diagnosis not present

## 2022-07-29 DIAGNOSIS — G4733 Obstructive sleep apnea (adult) (pediatric): Secondary | ICD-10-CM | POA: Diagnosis not present

## 2022-07-29 DIAGNOSIS — G629 Polyneuropathy, unspecified: Secondary | ICD-10-CM | POA: Diagnosis not present

## 2022-07-29 DIAGNOSIS — C9000 Multiple myeloma not having achieved remission: Secondary | ICD-10-CM | POA: Diagnosis not present

## 2022-07-29 DIAGNOSIS — Z4789 Encounter for other orthopedic aftercare: Secondary | ICD-10-CM | POA: Diagnosis not present

## 2022-07-29 DIAGNOSIS — M1990 Unspecified osteoarthritis, unspecified site: Secondary | ICD-10-CM | POA: Diagnosis not present

## 2022-07-29 LAB — KAPPA/LAMBDA LIGHT CHAINS
Kappa free light chain: 34.9 mg/L — ABNORMAL HIGH (ref 3.3–19.4)
Kappa, lambda light chain ratio: 7.43 — ABNORMAL HIGH (ref 0.26–1.65)
Lambda free light chains: 4.7 mg/L — ABNORMAL LOW (ref 5.7–26.3)

## 2022-07-29 MED ORDER — METOPROLOL TARTRATE 25 MG PO TABS: 25.0000 mg | ORAL_TABLET | Freq: Two times a day (BID) | ORAL | 3 refills | Status: DC

## 2022-07-29 NOTE — Patient Instructions (Signed)
Medication Instructions:  Your physician has recommended you make the following change in your medication:   Decrease metoprolol to 25 mg 2 times daily   *If you need a refill on your cardiac medications before your next appointment, please call your pharmacy*   Follow-Up: At [facility], you and your health needs are our priority.  As part of our continuing mission to provide you with exceptional heart care, we have created designated Provider Care Teams.  These Care Teams include your primary Cardiologist (physician) and Advanced Practice Providers (APPs -  Physician Assistants and Nurse Practitioners) who all work together to provide you with the care you need, when you need it.   Your next appointment:   6 month(s)  Provider:   [name], MD

## 2022-07-30 ENCOUNTER — Other Ambulatory Visit (HOSPITAL_COMMUNITY): Payer: Self-pay

## 2022-08-01 ENCOUNTER — Other Ambulatory Visit: Payer: Self-pay | Admitting: Internal Medicine

## 2022-08-02 ENCOUNTER — Encounter (HOSPITAL_COMMUNITY): Payer: Self-pay

## 2022-08-02 ENCOUNTER — Emergency Department (HOSPITAL_COMMUNITY)
Admission: EM | Admit: 2022-08-02 | Discharge: 2022-08-03 | Disposition: A | Discharge status: Home / Self Care | Payer: Medicare Other | Attending: Emergency Medicine | Admitting: Emergency Medicine

## 2022-08-02 ENCOUNTER — Emergency Department (HOSPITAL_COMMUNITY): Payer: Medicare Other

## 2022-08-02 DIAGNOSIS — T66XXXA Radiation sickness, unspecified, initial encounter: Secondary | ICD-10-CM

## 2022-08-02 DIAGNOSIS — R600 Localized edema: Secondary | ICD-10-CM | POA: Diagnosis not present

## 2022-08-02 DIAGNOSIS — H9202 Otalgia, left ear: Secondary | ICD-10-CM | POA: Diagnosis not present

## 2022-08-02 DIAGNOSIS — K208 Other esophagitis without bleeding: Secondary | ICD-10-CM | POA: Diagnosis not present

## 2022-08-02 DIAGNOSIS — D709 Neutropenia, unspecified: Secondary | ICD-10-CM | POA: Diagnosis not present

## 2022-08-02 DIAGNOSIS — Z8579 Personal history of other malignant neoplasms of lymphoid, hematopoietic and related tissues: Secondary | ICD-10-CM | POA: Insufficient documentation

## 2022-08-02 DIAGNOSIS — R1310 Dysphagia, unspecified: Secondary | ICD-10-CM | POA: Diagnosis present

## 2022-08-02 DIAGNOSIS — R0789 Other chest pain: Secondary | ICD-10-CM | POA: Diagnosis not present

## 2022-08-02 DIAGNOSIS — K209 Esophagitis, unspecified without bleeding: Secondary | ICD-10-CM | POA: Diagnosis not present

## 2022-08-02 LAB — MULTIPLE MYELOMA PANEL, SERUM
Albumin SerPl Elph-Mcnc: 2.9 g/dL (ref 2.9–4.4)
Albumin/Glob SerPl: 1.4 (ref 0.7–1.7)
Alpha 1: 0.3 g/dL (ref 0.0–0.4)
Alpha2 Glob SerPl Elph-Mcnc: 0.8 g/dL (ref 0.4–1.0)
B-Globulin SerPl Elph-Mcnc: 0.7 g/dL (ref 0.7–1.3)
Gamma Glob SerPl Elph-Mcnc: 0.3 g/dL — ABNORMAL LOW (ref 0.4–1.8)
Globulin, Total: 2.1 g/dL — ABNORMAL LOW (ref 2.2–3.9)
IgA: 29 mg/dL — ABNORMAL LOW (ref 61–437)
IgG (Immunoglobin G), Serum: 261 mg/dL — ABNORMAL LOW (ref 603–1613)
IgM (Immunoglobulin M), Srm: 28 mg/dL (ref 15–143)
Total Protein ELP: 5 g/dL — ABNORMAL LOW (ref 6.0–8.5)

## 2022-08-02 LAB — BASIC METABOLIC PANEL
Anion gap: 9 (ref 5–15)
BUN: 21 mg/dL (ref 8–23)
CO2: 22 mmol/L (ref 22–32)
Calcium: 8.2 mg/dL — ABNORMAL LOW (ref 8.9–10.3)
Chloride: 110 mmol/L (ref 98–111)
Creatinine, Ser: 0.76 mg/dL (ref 0.61–1.24)
GFR, Estimated: >60 mL/min (ref >60)
Glucose, Bld: 87 mg/dL (ref 70–99)
Potassium: 4.1 mmol/L (ref 3.5–5.1)
Sodium: 141 mmol/L (ref 135–145)

## 2022-08-02 LAB — CBC WITH DIFFERENTIAL/PLATELET
Abs Immature Granulocytes: 0.02 10*3/uL (ref 0.00–0.07)
Basophils Absolute: 0 10*3/uL (ref 0.0–0.1)
Basophils Relative: 0 %
Eosinophils Absolute: 0.3 10*3/uL (ref 0.0–0.5)
Eosinophils Relative: 11 %
HCT: 35.1 % — ABNORMAL LOW (ref 39.0–52.0)
Hemoglobin: 10.9 g/dL — ABNORMAL LOW (ref 13.0–17.0)
Immature Granulocytes: 1 %
Lymphocytes Relative: 22 %
Lymphs Abs: 0.5 10*3/uL — ABNORMAL LOW (ref 0.7–4.0)
MCH: 32.4 pg (ref 26.0–34.0)
MCHC: 31.1 g/dL (ref 30.0–36.0)
MCV: 104.5 fL — ABNORMAL HIGH (ref 80.0–100.0)
Monocytes Absolute: 0.6 10*3/uL (ref 0.1–1.0)
Monocytes Relative: 25 %
Neutro Abs: 1 10*3/uL — ABNORMAL LOW (ref 1.7–7.7)
Neutrophils Relative %: 41 %
Platelets: 71 10*3/uL — ABNORMAL LOW (ref 150–400)
RBC: 3.36 MIL/uL — ABNORMAL LOW (ref 4.22–5.81)
RDW: 17.5 % — ABNORMAL HIGH (ref 11.5–15.5)
WBC: 2.4 10*3/uL — ABNORMAL LOW (ref 4.0–10.5)
nRBC: 0 % (ref 0.0–0.2)

## 2022-08-02 LAB — GROUP A STREP BY PCR: Group A Strep by PCR: NOT DETECTED

## 2022-08-02 MED ORDER — LIDOCAINE VISCOUS HCL 2 % MT SOLN
15.0000 mL | Freq: Once | OROMUCOSAL | Status: AC
  Administered 2022-08-02: 15 mL via ORAL
  Filled 2022-08-02: qty 15

## 2022-08-02 MED ORDER — ALUM & MAG HYDROXIDE-SIMETH 200-200-20 MG/5ML PO SUSP
30.0000 mL | Freq: Once | ORAL | Status: AC
  Administered 2022-08-02: 30 mL via ORAL
  Filled 2022-08-02: qty 30

## 2022-08-02 MED ORDER — IOHEXOL 300 MG/ML  SOLN
75.0000 mL | Freq: Once | INTRAMUSCULAR | Status: AC | PRN
  Administered 2022-08-02: 75 mL via INTRAVENOUS

## 2022-08-02 NOTE — ED Triage Notes (Signed)
Pt c/o left ear pain. Pt states that he recently finished radiation for his cancer. Pt states that he has a sore throat from that, which his oncologist prepared him for. Pt states it took an hour to get an Ensure down.  Pt states that he has had drainage from that ear.

## 2022-08-02 NOTE — ED Notes (Signed)
Dr. Kingsley at bedside 

## 2022-08-02 NOTE — Discharge Instructions (Signed)
You were seen in the emergency department for your ear pain and your throat pain.  You no signs of ear infection and no signs of infection in your throat.  Your throat pain is likely due to your radiation and you should continue to take your medications as prescribed by your oncologist.  You can follow-up with ENT to have further evaluation of your ear and GI as you may need a scope of your throat if you are continuing to have pain and can follow-up with your oncologist as well.  You should return to the emergency department if you are unable to swallow anything without vomiting, you are becoming dehydrated, or if you have any other new or concerning symptoms.

## 2022-08-02 NOTE — ED Provider Notes (Signed)
EMERGENCY DEPARTMENT AT [facility] Provider Note   CSN: [id number] Arrival date & time: 08/02/22  1544     History  Chief Complaint  Patient presents with   Ear Pain   Sore Throat    Travis [name] is a 72 y.o. male.  Patient is a 72 year old male with a past medical history of multiple myeloma status post radiation to his neck presenting to the emergency department with pain with swallowing as well as left-sided ear pain.  The patient states that he has been seen by his oncologist for his painful swallowing and was told that it was likely due to his radiation.  He states that he was started on viscous lidocaine to take before eating as well as prophylactically treated for thrush.  He states that despite taking these medications he has had increasing difficulty with swallowing.  He denies any vomiting when he tries to swallow.  He states that he also feels like he has had significant drainage in his left ear.  He states that he was seen by his primary doctor who cleaned out his earwax but was unable to find the cause of his pain or drainage.  He denies any fevers or chills.  He denies any recent swimming, baths or soaking his head underwater.  The history is provided by the patient and the spouse.  Sore Throat       Home Medications Prior to Admission medications   Medication Sig Start Date End Date Taking? Authorizing Provider  acetaminophen (TYLENOL) 325 MG tablet Take 2 tablets (650 mg total) by mouth every 6 (six) hours as needed. 03/15/22   Jeanell Sparrow, DO  acyclovir (ZOVIRAX) 400 MG tablet Take 1 tablet (400 mg total) by mouth 2 (two) times daily. 12/10/21   Orson Slick, MD  amiodarone (PACERONE) 200 MG tablet Take 1 tablet (200 mg total) by mouth daily. Start after finishing 40 mg twice daily dose of  days 07/11/22   Antonieta Pert, MD  apixaban (ELIQUIS) 5 MG TABS tablet Take 1 tablet (5 mg total) by mouth 2 (two) times daily. 06/29/22   Nita Sells, MD  cholecalciferol (VITAMIN D3) 25 MCG (1000 UNIT) tablet Take 1,000 Units by mouth daily.    [provider]  Cyanocobalamin (VITAMIN B-12 PO) Take 1,000 mcg by mouth daily.    [provider]  dexamethasone (DECADRON) 4 MG tablet Take 40 mg by mouth See admin instructions. Take 40 mg by mouth once a day the morning of chemo on Wednesday- or unless otherwise instructed    [provider]  fluticasone (FLONASE) 50 MCG/ACT nasal spray Place 1 spray into both nostrils daily as needed for allergies or rhinitis.    [provider]  folic acid (FOLVITE) 1 MG tablet Take 1 mg by mouth daily. 11/12/19   [provider]  furosemide (LASIX) 40 MG tablet Take 40 mg by mouth daily as needed for edema. Pt is took 60 mg today since 40 was not working    [provider]  gabapentin (NEURONTIN) 300 MG capsule TAKE TWO CAPSULES BY MOUTH TWICE A DAY 03/29/22   Patel, Donika K, DO  hydrALAZINE (APRESOLINE) 25 MG tablet Take 1 tablet (25 mg total) by mouth every 8 (eight) hours. 07/06/22 08/05/22  Antonieta Pert, MD  lidocaine (XYLOCAINE) 2 % solution Use as directed 15 mLs in the mouth or throat 4 (four) times daily -  before meals and at bedtime. 07/28/22  Gery Pray, MD  lisinopril (ZESTRIL) 40 MG tablet TAKE ONE TABLET BY MOUTH ONE TIME DAILY 08/02/22   Fay Records, MD  methocarbamol (ROBAXIN) 750 MG tablet Take 750 mg by mouth every 8 (eight) hours as needed for muscle spasms. 06/16/22   [provider]  metoprolol tartrate (LOPRESSOR) 25 MG tablet Take 1 tablet (25 mg total) by mouth 2 (two) times daily. 07/29/22   Fay Records, MD  Omega-3 Fatty Acids (FISH OIL PO) Take 1 Capful by mouth daily.    [provider]  pomalidomide (POMALYST) 4 MG capsule Take 1 capsule (4 mg total) by mouth See admin instructions. Take 4 mg by mouth once a day for 21 days "on," then 7 days "off." Repeat every 28 days. 07/14/22   Orson Slick, MD   potassium chloride SA (KLOR-CON M) 20 MEQ tablet Take 2 tablets (40 mEq total) by mouth daily. 06/22/22   Nita Sells, MD  predniSONE (DELTASONE) 20 MG tablet Take 1 tablet (20 mg total) by mouth daily with breakfast for 5 days. 07/09/22 08/04/22  Antonieta Pert, MD  sodium chloride (OCEAN) 0.65 % SOLN nasal spray Place 1 spray into both nostrils as needed for congestion.    [provider]      Allergies    Linezolid and Vancomycin    Review of Systems   Review of Systems  Physical Exam Updated Vital Signs BP (!) 155/86 (BP Location: Left Arm)   Pulse (!) 50   Temp 97.8 F (36.6 C) (Oral)   Resp 14   Ht 5\' 10"  (1.778 m)   Wt 109.3 kg   SpO2 96%   BMI 34.58 kg/m  Physical Exam Vitals and nursing note reviewed.  Constitutional:      General: He is not in acute distress.    Appearance: He is well-developed.  HENT:     Head: Normocephalic and atraumatic.     Right Ear: Tympanic membrane and ear canal normal. No drainage, swelling or tenderness.     Left Ear: Tympanic membrane and ear canal normal. No drainage, swelling or tenderness.     Ears:     Comments: No mastoid tenderness bilaterally    Mouth/Throat:     Mouth: Mucous membranes are moist. No oral lesions.     Pharynx: Oropharynx is clear. Uvula midline. No pharyngeal swelling, oropharyngeal exudate, posterior oropharyngeal erythema or uvula swelling.     Tonsils: No tonsillar exudate or tonsillar abscesses. 0 on the right. 0 on the left.  Eyes:     Conjunctiva/sclera: Conjunctivae normal.  Cardiovascular:     Rate and Rhythm: Normal rate and regular rhythm.     Heart sounds: Normal heart sounds.  Pulmonary:     Effort: Pulmonary effort is normal.     Breath sounds: Normal breath sounds. No stridor.  Abdominal:     General: Bowel sounds are normal.     Palpations: Abdomen is soft.     Tenderness: There is no abdominal tenderness.  Musculoskeletal:     Cervical back: Normal range of motion and neck  supple.  Skin:    General: Skin is warm and dry.  Neurological:     General: No focal deficit present.     Mental Status: He is alert and oriented to person, place, and time.  Psychiatric:        Mood and Affect: Mood normal.        Behavior: Behavior normal.     ED Results /  Procedures / Treatments   Labs (all labs ordered are listed, but only abnormal results are displayed) Labs Reviewed  BASIC METABOLIC PANEL - Abnormal; Notable for the following components:      Result Value   Calcium 8.2 (*)    All other components within normal limits  CBC WITH DIFFERENTIAL/PLATELET - Abnormal; Notable for the following components:   WBC 2.4 (*)    RBC 3.36 (*)    Hemoglobin 10.9 (*)    HCT 35.1 (*)    MCV 104.5 (*)    RDW 17.5 (*)    Platelets 71 (*)    Neutro Abs 1.0 (*)    Lymphs Abs 0.5 (*)    All other components within normal limits  GROUP A STREP BY PCR    EKG EKG Interpretation  Date/Time:  Monday August 02 2022 20:46:49 EDT Ventricular Rate:  52 PR Interval:  161 QRS Duration: 109 QT Interval:  523 QTC Calculation: 487 R Axis:   97 Text Interpretation: Sinus rhythm Right axis deviation Borderline prolonged QT interval In sinus rhythm compared to prior EKG Confirmed by Leanord Asal ([id number]) on 08/02/2022 8:55:03 PM  Radiology No results found.  Procedures Procedures    Medications Ordered in ED Medications  alum & mag hydroxide-simeth (MAALOX/MYLANTA) 200-200-20 MG/5ML suspension 30 mL (30 mLs Oral Given 08/02/22 2046)    And  lidocaine (XYLOCAINE) 2 % viscous mouth solution 15 mL (15 mLs Oral Given 08/02/22 2046)  iohexol (OMNIPAQUE) 300 MG/ML solution 75 mL (75 mLs Intravenous Contrast Given 08/02/22 2113)    ED Course/ Medical Decision Making/ A&P Clinical Course as of 08/02/22 2344  Mon Aug 02, 2022  2335 CT did not show acute abnormality to explain symptoms. Throat pain likely due to radiation, no evidence of deep space infection and he is tolerating PO. He  will be given outpatient GI and ENT follow up and was given strict return precautions. [VK]    Clinical Course User Index [VK] Kemper Durie, DO                             Medical Decision Making This patient presents to the ED with chief complaint(s) of throat pain, ear pain with pertinent past medical history of multiple myeloma s/p radiation which further complicates the presenting complaint. The complaint involves an extensive differential diagnosis and also carries with it a high risk of complications and morbidity.    The differential diagnosis includes radiation esophagitis, GERD, strictures, candida esophagitis, no evidence of otitis media or external, no evidence of mastoiditis on exam, normal phonation, tolerating secretions and no trismus making deep space infection less likely, dehydration   Additional history obtained: Additional history obtained from spouse Records reviewed outpatient oncology records  ED Course and Reassessment: Patient was initially evaluated by provider in triage and had labs, strep swab and CT of his neck performed.  The patient's labs showed no signs of severe dehydration.  Does show mild neutropenia however he has no fever or signs of infection pain here.  He has no evidence of otitis media or externa.  He is tolerating his secretions and is tolerating p.o.  He was given GI cocktail with viscous lidocaine to help with his throat pain.  He is pending at this time.  Independent labs interpretation:  The following labs were independently interpreted: Mild neutropenia otherwise within normal range  Independent visualization of imaging: - I independently visualized the following imaging with  scope of interpretation limited to determining acute life threatening conditions related to emergency care: CT neck, which revealed no acute disease  Consultation: - Consulted or discussed management/test interpretation w/ external professional:  N/A  Consideration for admission or further workup: Patient has no emergent conditions requiring admission or further work-up at this time and is stable for discharge home with primary care follow-up  Social Determinants of health: N/A    Risk OTC drugs. Prescription drug management.          Final Clinical Impression(s) / ED Diagnoses Final diagnoses:  Left ear pain  Radiation-induced esophagitis    Rx / DC Orders ED Discharge Orders     None         Kemper Durie, DO 08/02/22 2344

## 2022-08-02 NOTE — ED Provider Triage Note (Signed)
Emergency Medicine Provider Triage Evaluation Note  RYZE BACKE , a 72 y.o. male  was evaluated in triage.  Pt complains of dysphagia. Hx of CA and having neck radiation.  Here with difficulty swallowing x 1 week and muffled L ear for the same duration.  No fever, sob, cough  Review of Systems  Positive: As above Negative: As above  Physical Exam  BP (!) 156/77 (BP Location: Left Arm)   Pulse (!) 52   Temp 98.1 F (36.7 C) (Oral)   Resp 16   Ht 5\' 10"  (1.778 m)   Wt 109.3 kg   SpO2 97%   BMI 34.58 kg/m  Gen:   Awake, no distress   Resp:  Normal effort  MSK:   Moves extremities without difficulty  Other:    Medical Decision Making  Medically screening exam initiated at 4:17 PM.  Appropriate orders placed.  [name] was informed that the remainder of the evaluation will be completed by another provider, this initial triage assessment does not replace that evaluation, and the importance of remaining in the ED until their evaluation is complete.    Domenic Moras, PA-C 08/02/22 [phone]

## 2022-08-02 NOTE — ED Notes (Signed)
Patient transported to CT 

## 2022-08-03 ENCOUNTER — Encounter (HOSPITAL_COMMUNITY): Admission: RE | Payer: Self-pay | Source: Home / Self Care

## 2022-08-03 ENCOUNTER — Ambulatory Visit (HOSPITAL_COMMUNITY): Admission: RE | Admit: 2022-08-03 | Payer: Medicare Other | Source: Home / Self Care | Admitting: Internal Medicine

## 2022-08-03 ENCOUNTER — Other Ambulatory Visit: Payer: Self-pay

## 2022-08-03 SURGERY — CARDIOVERSION
Anesthesia: General

## 2022-08-03 NOTE — ED Notes (Signed)
Pt and caregiver verbalized understanding of d/c instructions, prescription and follow up care.

## 2022-08-04 ENCOUNTER — Other Ambulatory Visit: Payer: Self-pay | Admitting: *Deleted

## 2022-08-04 ENCOUNTER — Inpatient Hospital Stay: Payer: Medicare Other | Admitting: Hematology and Oncology

## 2022-08-04 ENCOUNTER — Other Ambulatory Visit: Payer: Self-pay

## 2022-08-04 ENCOUNTER — Inpatient Hospital Stay: Payer: Medicare Other

## 2022-08-04 ENCOUNTER — Inpatient Hospital Stay: Payer: Medicare Other | Attending: Hematology and Oncology

## 2022-08-04 VITALS — BP 136/61 | HR 47 | Temp 98.5°F | Resp 16 | Wt 242.0 lb

## 2022-08-04 VITALS — BP 130/66 | HR 50 | Resp 16

## 2022-08-04 DIAGNOSIS — E876 Hypokalemia: Secondary | ICD-10-CM | POA: Insufficient documentation

## 2022-08-04 DIAGNOSIS — Z5112 Encounter for antineoplastic immunotherapy: Secondary | ICD-10-CM | POA: Insufficient documentation

## 2022-08-04 DIAGNOSIS — Z818 Family history of other mental and behavioral disorders: Secondary | ICD-10-CM | POA: Insufficient documentation

## 2022-08-04 DIAGNOSIS — K5900 Constipation, unspecified: Secondary | ICD-10-CM | POA: Insufficient documentation

## 2022-08-04 DIAGNOSIS — C9000 Multiple myeloma not having achieved remission: Secondary | ICD-10-CM | POA: Diagnosis not present

## 2022-08-04 DIAGNOSIS — T451X5A Adverse effect of antineoplastic and immunosuppressive drugs, initial encounter: Secondary | ICD-10-CM

## 2022-08-04 DIAGNOSIS — Z823 Family history of stroke: Secondary | ICD-10-CM | POA: Diagnosis not present

## 2022-08-04 DIAGNOSIS — Z7961 Long term (current) use of immunomodulator: Secondary | ICD-10-CM | POA: Diagnosis not present

## 2022-08-04 DIAGNOSIS — M542 Cervicalgia: Secondary | ICD-10-CM | POA: Diagnosis not present

## 2022-08-04 DIAGNOSIS — R1310 Dysphagia, unspecified: Secondary | ICD-10-CM | POA: Diagnosis not present

## 2022-08-04 DIAGNOSIS — I480 Paroxysmal atrial fibrillation: Secondary | ICD-10-CM | POA: Diagnosis not present

## 2022-08-04 DIAGNOSIS — R21 Rash and other nonspecific skin eruption: Secondary | ICD-10-CM | POA: Insufficient documentation

## 2022-08-04 DIAGNOSIS — I10 Essential (primary) hypertension: Secondary | ICD-10-CM | POA: Diagnosis not present

## 2022-08-04 DIAGNOSIS — Z923 Personal history of irradiation: Secondary | ICD-10-CM | POA: Diagnosis not present

## 2022-08-04 DIAGNOSIS — Z7901 Long term (current) use of anticoagulants: Secondary | ICD-10-CM | POA: Insufficient documentation

## 2022-08-04 DIAGNOSIS — Z515 Encounter for palliative care: Secondary | ICD-10-CM | POA: Diagnosis not present

## 2022-08-04 DIAGNOSIS — R200 Anesthesia of skin: Secondary | ICD-10-CM | POA: Diagnosis not present

## 2022-08-04 DIAGNOSIS — D61810 Antineoplastic chemotherapy induced pancytopenia: Secondary | ICD-10-CM

## 2022-08-04 DIAGNOSIS — R600 Localized edema: Secondary | ICD-10-CM | POA: Insufficient documentation

## 2022-08-04 DIAGNOSIS — M5126 Other intervertebral disc displacement, lumbar region: Secondary | ICD-10-CM | POA: Insufficient documentation

## 2022-08-04 DIAGNOSIS — Z881 Allergy status to other antibiotic agents status: Secondary | ICD-10-CM | POA: Diagnosis not present

## 2022-08-04 DIAGNOSIS — Z8249 Family history of ischemic heart disease and other diseases of the circulatory system: Secondary | ICD-10-CM | POA: Diagnosis not present

## 2022-08-04 DIAGNOSIS — Z79899 Other long term (current) drug therapy: Secondary | ICD-10-CM | POA: Diagnosis not present

## 2022-08-04 DIAGNOSIS — M533 Sacrococcygeal disorders, not elsewhere classified: Secondary | ICD-10-CM | POA: Insufficient documentation

## 2022-08-04 DIAGNOSIS — Z86718 Personal history of other venous thrombosis and embolism: Secondary | ICD-10-CM | POA: Insufficient documentation

## 2022-08-04 DIAGNOSIS — J90 Pleural effusion, not elsewhere classified: Secondary | ICD-10-CM | POA: Insufficient documentation

## 2022-08-04 LAB — CMP (CANCER CENTER ONLY)
ALT: 13 U/L (ref 0–44)
AST: 9 U/L — ABNORMAL LOW (ref 15–41)
Albumin: 3.2 g/dL — ABNORMAL LOW (ref 3.5–5.0)
Alkaline Phosphatase: 60 U/L (ref 38–126)
Anion gap: 5 (ref 5–15)
BUN: 23 mg/dL (ref 8–23)
CO2: 27 mmol/L (ref 22–32)
Calcium: 8.2 mg/dL — ABNORMAL LOW (ref 8.9–10.3)
Chloride: 110 mmol/L (ref 98–111)
Creatinine: 0.9 mg/dL (ref 0.61–1.24)
GFR, Estimated: >60 mL/min (ref >60)
Glucose, Bld: 101 mg/dL — ABNORMAL HIGH (ref 70–99)
Potassium: 3.9 mmol/L (ref 3.5–5.1)
Sodium: 142 mmol/L (ref 135–145)
Total Bilirubin: 0.4 mg/dL (ref 0.3–1.2)
Total Protein: 5.2 g/dL — ABNORMAL LOW (ref 6.5–8.1)

## 2022-08-04 LAB — CBC WITH DIFFERENTIAL (CANCER CENTER ONLY)
Abs Immature Granulocytes: 0.05 10*3/uL (ref 0.00–0.07)
Basophils Absolute: 0 10*3/uL (ref 0.0–0.1)
Basophils Relative: 0 %
Eosinophils Absolute: 0.3 10*3/uL (ref 0.0–0.5)
Eosinophils Relative: 12 %
HCT: 33.6 % — ABNORMAL LOW (ref 39.0–52.0)
Hemoglobin: 10.5 g/dL — ABNORMAL LOW (ref 13.0–17.0)
Immature Granulocytes: 2 %
Lymphocytes Relative: 20 %
Lymphs Abs: 0.5 10*3/uL — ABNORMAL LOW (ref 0.7–4.0)
MCH: 32 pg (ref 26.0–34.0)
MCHC: 31.3 g/dL (ref 30.0–36.0)
MCV: 102.4 fL — ABNORMAL HIGH (ref 80.0–100.0)
Monocytes Absolute: 0.4 10*3/uL (ref 0.1–1.0)
Monocytes Relative: 18 %
Neutro Abs: 1.2 10*3/uL — ABNORMAL LOW (ref 1.7–7.7)
Neutrophils Relative %: 48 %
Platelet Count: 107 10*3/uL — ABNORMAL LOW (ref 150–400)
RBC: 3.28 MIL/uL — ABNORMAL LOW (ref 4.22–5.81)
RDW: 17.5 % — ABNORMAL HIGH (ref 11.5–15.5)
Smear Review: NORMAL
WBC Count: 2.5 10*3/uL — ABNORMAL LOW (ref 4.0–10.5)
nRBC: 0 % (ref 0.0–0.2)

## 2022-08-04 MED ORDER — POMALIDOMIDE 2 MG PO CAPS
2.0000 mg | ORAL_CAPSULE | Freq: Every day | ORAL | 0 refills | Status: DC
Start: 2022-08-04 — End: 2022-08-27

## 2022-08-04 MED ORDER — BORTEZOMIB CHEMO SQ INJECTION 3.5 MG (2.5MG/ML)
1.3000 mg/m2 | Freq: Once | INTRAMUSCULAR | Status: AC
  Administered 2022-08-04: 3 mg via SUBCUTANEOUS
  Filled 2022-08-04: qty 1.2

## 2022-08-04 NOTE — Progress Notes (Signed)
[facility] [place] [phone]   [phone]  PROGRESS NOTE  Patient Care Team: [name], MD as PCP - General (Internal Medicine) Fay Records, MD as PCP - Cardiology (Cardiology) Sueanne Margarita, MD as PCP - Sleep Medicine (Cardiology) Vickie Epley, MD as PCP - Electrophysiology (Cardiology) Michael Boston, MD as Consulting Physician (General Surgery) Clarene Essex, MD as Consulting Physician (Gastroenterology) Sueanne Margarita, MD as Consulting Physician (Sleep Medicine) Alda Berthold, DO as Consulting Physician (Neurology)  Hematological/Oncological History # IgA Kappa Multiple Myeloma 05/05/2020: Bone marrow biopsy showed increased number of plasma cells representing 14% of all cells in the aspirate associated with numerous variably sized clusters in the clot and biopsy sections 04/10/2021: M protein 0.3, IgA kappa specificity. Kappa 580.5, Labda 7.0, ratio 82.93.   12/07/2021: Labs show of 1971.3, lambda 5.5, ratio 358.42 12/10/2021: Transition care to Dr. Lorenso Courier. 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  01/26/2022: Cycle 2 Day 1 of Dara/Rev/Dex 02/22/2022: Cycle 3 Day 1 of Dara/Rev/Dex 03/23/2022: Cycle 4 Day 1 of Dara/Rev/Dex 04/20/2022: Cycle 5 Day 1 of Dara/Rev/Dex 05/18/2022: Cycle 6 Day 1 of Dara/Rev/Dex 05/27/2022: MRI cervical/thoracic/lumbar: pathologic fracture at T2 with significant osseous retropulsion and resulting cord compression. Additional smaller lesions within T1 vertebral body, right C7 and T3 articulating facets.  Small Multiple Myeloma lesions scattered at all lumbar levels and in the visible left sacral ala. Largest lumbar tumor is 2.3 cm in the left L1 posterior elements and pedicle. No lumbar extraosseous or epidural tumor extension, or pathologic fracture.Underlying chronic lumbar spine  degeneration, with a new central disc herniation at L3-L4 since 2021 now resulting in mild to moderate degenerative spinal stenosis there. 05/28/2022-06/09/2022: Admitted for T2 pathologic fracture and cord compression. He received urgent radiation therapy for a total of 3.0 Gy. He underwent  thoracic 1 through 4 posterior lateral arthrodesis/fusion and 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  06/23/2022: Cycle 1 Day 1 of Velcade/Pomalyst/Dex 08/04/2022: Cycle 2 Day 1 of Velcade/Pomalyst/Dex. Pomalyst to be decreased to 2mg  PO daily due to cytopenias.   Interval History:  Travis [name] 72 y.o. male with medical history significant for IgA Kappa Multiple Myeloma who presents for a follow up visit. He was last seen on 06/15/2022. In the interim, he continues on Velcade/Pomalyst/Dex therapy  On exam today Travis [name] reports the primary issue he is suffering from right now is a sore throat from his radiation therapy.  He has lidocaine swish and swallow which he has been using and has been helping some.  He reports is also doing his best to drink Pedialyte, Ensure, and Gatorade in order to keep his hydration and nutrition up.  He reports that he is having improvement in his range of motion after his spine surgery and is able to lift his head better.  He reports he is having some occasional numbness in his fingers and toes but it dissipates quickly.  He is not having any nausea, vomiting, or diarrhea but he does have some occasional constipation.  He has been taking MiraLAX and stool softeners and has access to prune juice but has not been using it.  His appetite has been good though his weight is down to 242 pounds from 247 pounds in March.  He denies any fevers, chills, sweats, shortness of breath, chest pain.  A full 10 point ROS is listed below.  Overall he is willing and able to proceed with treatment today.  MEDICAL HISTORY:  Past Medical History:  Diagnosis Date   A-fib     Arthritis    "comes and goes; mostly in hands, occasionally elbow" (0000000)   Complication of anesthesia    "just wanted to sleep alot  for couple days S/P VARICOCELE OR" (04/05/2017)   DVT (deep venous thrombosis)    LLE   Dysrhythmia    PVCs    GERD (gastroesophageal reflux disease)    History of hiatal hernia    Hypertension    Impaired glucose tolerance    Multiple myeloma    Obesity (BMI 30-39.9) 07/26/2016   OSA on CPAP 07/26/2016   Mild with AHI 12.5/hr now on CPAP at 14cm H2O   PAF (paroxysmal atrial fibrillation)    s/p PVI Ablation in 2018 // Flecainide Rx // Echo 8/21: EF 60-65, no RWMA, mild LVH, normal RV SF, moderate RVE, mild BAE, trivial MR, mild dilation of aortic root (40 mm)   Pneumonia    "may have had walking pneumonia a few years ago" (04/05/2017)   Pre-diabetes    Thrombophlebitis     SURGICAL HISTORY: Past Surgical History:  Procedure Laterality Date   ATRIAL FIBRILLATION ABLATION  04/05/2017   ATRIAL FIBRILLATION ABLATION N/A 04/05/2017   Procedure: ATRIAL FIBRILLATION ABLATION;  Surgeon: Thompson Grayer, MD;  Location: [facility];  Service: Cardiovascular;  Laterality: N/A;   BONE BIOPSY Right 12/17/2021   Procedure: BONE BIOPSY;  Surgeon: Hiram Gash, MD;  Location: [facility];  Service: Orthopedics;  Laterality: Right;   COMPLETE RIGHT HIP REPLACMENT  01/19/2019   EVALUATION UNDER ANESTHESIA WITH HEMORRHOIDECTOMY N/A 02/24/2017   Procedure: EXAM UNDER ANESTHESIA WITH  HEMORRHOIDECTOMY;  Surgeon: Michael Boston, MD;  Location: [facility];  Service: General;  Laterality: N/A;   EXCISIONAL TOTAL HIP ARTHROPLASTY WITH ANTIBIOTIC SPACERS Right 09/25/2020   Procedure: EXCISIONAL TOTAL HIP ARTHROPLASTY WITH ANTIBIOTIC SPACERS;  Surgeon: Paralee Cancel, MD;  Location: [facility];  Service: Orthopedics;  Laterality: Right;   FLEXIBLE SIGMOIDOSCOPY N/A 04/15/2015   Procedure:  UNSEDATED FLEXIBLE SIGMOIDOSCOPY;  Surgeon: [name], MD;   Location: [facility];  Service: Endoscopy;  Laterality: N/A;   HUMERUS IM NAIL Right 12/17/2021   Procedure: INTRAMEDULLARY (IM) NAIL HUMERAL;  Surgeon: Hiram Gash, MD;  Location: [facility];  Service: Orthopedics;  Laterality: Right;   KNEE ARTHROSCOPY Left ~ 2016   POSTERIOR CERVICAL FUSION/FORAMINOTOMY N/A 06/04/2022   Procedure: Thoracic one - Thoracic Four Posterior lateral arthrodesis/ fusion and Thoracic two Corpectomy;  Surgeon: Ashok Pall, MD;  Location: [facility];  Service: Neurosurgery;  Laterality: N/A;   REIMPLANTATION OF TOTAL HIP Right 12/25/2020   Procedure: REIMPLANTATION/REVISION OF RIGHT TOTAL HIP VERSUS REPEAT IRRIGATION AND DEBRIDEMENT;  Surgeon: Paralee Cancel, MD;  Location: [facility];  Service: Orthopedics;  Laterality: Right;   TESTICLE SURGERY  1988   VARICOCELE   TONSILLECTOMY     VARICOSE VEIN SURGERY Bilateral     SOCIAL HISTORY: Social History   Socioeconomic History   Marital status: Married    Spouse name: Not on file   Number of children: 1   Years of education: law school   Highest education level: Not on file  Occupational History   Occupation: lawyer  Tobacco Use   Smoking status: Never   Smokeless tobacco: Never  Vaping Use   Vaping Use: Never used  Substance and Sexual Activity   Alcohol  use: Yes    Alcohol/week: 2.0 standard drinks of alcohol    Types: 2 Glasses of wine per week    Comment: 2 glasses of wine a week, occ bourbon, beer   Drug use: No   Sexual activity: Not Currently  Other Topics Concern   Not on file  Social History Narrative   Lives in [place] with spouse in a 2 story home.  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    Social Determinants of Health   Financial Resource Strain: Not on file  Food Insecurity: No Food Insecurity (06/30/2022)   Hunger Vital Sign    Worried About Running Out of Food in the Last Year: Never true    Ran Out of Food  in the Last Year: Never true  Transportation Needs: No Transportation Needs (06/30/2022)   PRAPARE - Hydrologist (Medical): No    Lack of Transportation (Non-Medical): No  Physical Activity: Not on file  Stress: Not on file  Social Connections: Not on file  Intimate Partner Violence: Not At Risk (06/30/2022)   Humiliation, Afraid, Rape, and Kick questionnaire    Fear of Current or Ex-Partner: No    Emotionally Abused: No    Physically Abused: No    Sexually Abused: No    FAMILY HISTORY: Family History  Problem Relation Age of Onset   Dementia Mother    Stroke Father    Atrial fibrillation Father    Hypertension Father    Hypertension Paternal Grandfather    Dementia Maternal Grandmother     ALLERGIES:  is allergic to linezolid and vancomycin.  MEDICATIONS:  Current Outpatient Medications  Medication Sig Dispense Refill   acetaminophen (TYLENOL) 325 MG tablet Take 2 tablets (650 mg total) by mouth every 6 (six) hours as needed. 36 tablet 0   acyclovir (ZOVIRAX) 400 MG tablet Take 1 tablet (400 mg total) by mouth 2 (two) times daily. 180 tablet 3   amiodarone (PACERONE) 200 MG tablet Take 1 tablet (200 mg total) by mouth daily. Start after finishing 40 mg twice daily dose of  days 180 tablet 1   apixaban (ELIQUIS) 5 MG TABS tablet Take 1 tablet (5 mg total) by mouth 2 (two) times daily. 60 tablet 11   cholecalciferol (VITAMIN D3) 25 MCG (1000 UNIT) tablet Take 1,000 Units by mouth daily.     Cyanocobalamin (VITAMIN B-12 PO) Take 1,000 mcg by mouth daily.     dexamethasone (DECADRON) 4 MG tablet Take 40 mg by mouth See admin instructions. Take 40 mg by mouth once a day the morning of chemo on Wednesday- or unless otherwise instructed     fluticasone (FLONASE) 50 MCG/ACT nasal spray Place 1 spray into both nostrils daily as needed for allergies or rhinitis.     folic acid (FOLVITE) 1 MG tablet Take 1 mg by mouth daily.     furosemide (LASIX) 40 MG  tablet Take 40 mg by mouth daily as needed for edema. Pt is took 60 mg today since 40 was not working     gabapentin (NEURONTIN) 300 MG capsule TAKE TWO CAPSULES BY MOUTH TWICE A DAY 360 capsule 1   hydrALAZINE (APRESOLINE) 25 MG tablet Take 1 tablet (25 mg total) by mouth every 8 (eight) hours. 90 tablet 0   lidocaine (XYLOCAINE) 2 % solution Use as directed 15 mLs in the mouth or throat 4 (four) times  daily -  before meals and at bedtime. 250 mL 1   lisinopril (ZESTRIL) 40 MG tablet TAKE ONE TABLET BY MOUTH ONE TIME DAILY 90 tablet 3   methocarbamol (ROBAXIN) 750 MG tablet Take 750 mg by mouth every 8 (eight) hours as needed for muscle spasms.     metoprolol tartrate (LOPRESSOR) 25 MG tablet Take 1 tablet (25 mg total) by mouth 2 (two) times daily. (Patient taking differently: Take 50 mg by mouth 2 (two) times daily.) 180 tablet 3   Omega-3 Fatty Acids (FISH OIL PO) Take 1 Capful by mouth daily.     potassium chloride SA (KLOR-CON M) 20 MEQ tablet Take 2 tablets (40 mEq total) by mouth daily. 10 tablet 0   predniSONE (DELTASONE) 20 MG tablet Take 1 tablet (20 mg total) by mouth daily with breakfast for 5 days. 5 tablet 0   sodium chloride (OCEAN) 0.65 % SOLN nasal spray Place 1 spray into both nostrils as needed for congestion.     pomalidomide (POMALYST) 2 MG capsule Take 1 capsule (2 mg total) by mouth daily. Take one tablet (2 mg by mouth) daily for 21 days, then take 7 days off 21 capsule 0   No current facility-administered medications for this visit.    REVIEW OF SYSTEMS:   Constitutional: ( - ) fevers, ( - )  chills , ( - ) night sweats Eyes: ( - ) blurriness of vision, ( - ) double vision, ( - ) watery eyes Ears, nose, mouth, throat, and face: ( - ) mucositis, ( - ) sore throat Respiratory: ( - ) cough, ( - ) dyspnea, ( - ) wheezes Cardiovascular: ( - ) palpitation, ( - ) chest discomfort, ( - ) lower extremity swelling Gastrointestinal:  ( - ) nausea, ( - ) heartburn, ( - ) change in  bowel habits Skin: ( - ) abnormal skin rashes Lymphatics: ( - ) new lymphadenopathy, ( - ) easy bruising Neurological: ( - ) numbness, ( - ) tingling, ( - ) new weaknesses Behavioral/Psych: ( - ) mood change, ( - ) new changes  All other systems were reviewed with the patient and are negative.  PHYSICAL EXAMINATION: ECOG PERFORMANCE STATUS: 2 - Symptomatic, <50% confined to bed  Vitals:   08/04/22 1140  BP: 136/61  Pulse: (!) 47  Resp: 16  Temp: 98.5 F (36.9 C)  SpO2: 95%     Filed Weights   08/04/22 1140  Weight: 242 lb (109.8 kg)      GENERAL: Well-appearing elderly Caucasian male, alert, no distress and comfortable SKIN: no overt abnormalities of the skin.  EYES: conjunctiva are pink and non-injected, sclera clear LUNGS: clear to auscultation and percussion with normal breathing effort HEART: tachycardic &  no murmurs. +bilateral lower extremity edema Musculoskeletal: no cyanosis of digits and no clubbing  PSYCH: alert & oriented x 3, fluent speech NEURO: no focal motor/sensory deficits  LABORATORY DATA:  I have reviewed the data as listed    Latest Ref Rng & Units 08/04/2022   11:04 AM 08/02/2022    5:15 PM 07/28/2022   11:37 AM  CBC  WBC 4.0 - 10.5 K/uL 2.5  2.4  4.0   Hemoglobin 13.0 - 17.0 g/dL 10.5  10.9  10.7   Hematocrit 39.0 - 52.0 % 33.6  35.1  33.4   Platelets 150 - 400 K/uL 107  71  92        Latest Ref Rng & Units 08/04/2022   11:04  AM 08/02/2022    5:15 PM 07/28/2022   11:37 AM  CMP  Glucose 70 - 99 mg/dL 101  87  112   BUN 8 - 23 mg/dL 23  21  23    Creatinine 0.61 - 1.24 mg/dL 0.90  0.76  1.14   Sodium 135 - 145 mmol/L 142  141  143   Potassium 3.5 - 5.1 mmol/L 3.9  4.1  3.9   Chloride 98 - 111 mmol/L 110  110  108   CO2 22 - 32 mmol/L 27  22  28    Calcium 8.9 - 10.3 mg/dL 8.2  8.2  8.4   Total Protein 6.5 - 8.1 g/dL 5.2   5.7   Total Bilirubin 0.3 - 1.2 mg/dL 0.4   0.8   Alkaline Phos 38 - 126 U/L 60   63   AST 15 - 41 U/L 9   8   ALT 0 -  44 U/L 13   26     Lab Results  Component Value Date   MPROTEIN Not Observed 07/28/2022   MPROTEIN Not Observed 06/23/2022   MPROTEIN 0.1 (H) 05/27/2022   Lab Results  Component Value Date   KPAFRELGTCHN 34.9 (H) 07/28/2022   KPAFRELGTCHN 138.1 (H) 06/23/2022   KPAFRELGTCHN 233.8 (H) 05/27/2022   LAMBDASER 4.7 (L) 07/28/2022   LAMBDASER 3.4 (L) 06/23/2022   LAMBDASER 2.5 (L) 05/27/2022   KAPLAMBRATIO 7.43 (H) 07/28/2022   KAPLAMBRATIO 40.62 (H) 06/23/2022   KAPLAMBRATIO 93.52 (H) 05/27/2022     RADIOGRAPHIC STUDIES: CT Soft Tissue Neck W Contrast  Result Date: 08/02/2022 CLINICAL DATA:  Left ear pain, history of multiple myeloma, recently finished radiation to upper thoracic spine for cancer EXAM: CT NECK WITH CONTRAST TECHNIQUE: Multidetector CT imaging of the neck was performed using the standard protocol following the bolus administration of intravenous contrast. RADIATION DOSE REDUCTION: This exam was performed according to the departmental dose-optimization program which includes automated exposure control, adjustment of the mA and/or kV according to patient size and/or use of iterative reconstruction technique. CONTRAST:  49mL OMNIPAQUE IOHEXOL 300 MG/ML  SOLN COMPARISON:  No prior neck CT, correlation is made with 06/29/2022 CTA chest FINDINGS: Pharynx and larynx: No acute finding in the nasopharynx or oropharynx. Edema along the posterior aspect of the hypopharynx/proximal larynx (series 5, image 87), with some thickening of the proximal esophagus. No mass is seen. Salivary glands: No inflammation, mass, or stone. Thyroid: Normal. Lymph nodes: None enlarged or abnormal density. Vascular: Patent. Limited intracranial: Negative. Visualized orbits: Negative. Mastoids and visualized paranasal sinuses: Minimal mucosal thickening in the left maxillary sinus. Fluid in left mastoid air cells. Otherwise clear. Skeleton: Status post T1-T4 posterior fusion with T2 corpectomy spacer. No acute  fracture in the imaged cervical or thoracic spine, although evaluation is somewhat limited by beam hardening artifact from the patient's hardware. Fluid collection in the soft tissues posterior to the spine and in the paraspinous musculature, which surrounds the spinous processes of C7 and T1. This collection measures up to 3.9 x 6.3 x 6.4 cm (AP x TR x CC) (series 5, image 92 and series 7, image 67). This is adjacent to the postsurgical changes in the subcutaneous tissues and may be postsurgical; this is likely present and larger on the 06/29/2022 CTA chest, where it is obscured by beam hardening artifact. Upper chest: Debris in the esophagus, which increases the risk of aspiration. Moderate right pleural effusion with associated atelectasis. Other: None. IMPRESSION: 1. Edema along the posterior aspect  of the hypopharynx, proximal larynx, and proximal esophagus, favored to represent the sequela of recent radiation therapy. No mass or abscess seen. 2. Fluid collection in the soft tissues posterior to the spine and in the paraspinous musculature, favored to be related to the patient's recent spine surgery and likely present on the prior CTA chest. While this may represent a postoperative seroma, an abscess cannot be excluded. 3. Moderate right pleural effusion with associated atelectasis. 4. Debris in the esophagus, which increases the risk of aspiration. Electronically Signed   By: Merilyn Baba M.D.   On: 08/02/2022 21:53    ASSESSMENT & PLAN FARRON YEARGIN is a 72 y.o. male with medical history significant for IgA Kappa Multiple Myeloma who presents for a follow up visit.   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  He meets diagnostic criteria by having a serum free light chain ratio greater than 100 and lytic lesions causing pathological fracture of the humerus.  Given this I would recommend we pursued  Darzalex, Revlimid, and dexamethasone.  Given his advanced age I would not recommend transplantation, that this is something we can consider when it comes time to transition to maintenance therapy versus transplant.  The patient voices understanding of the plan moving forward.  # IgA Kappa Multiple Myeloma --Metastatic survey completed 12/15/2021 --Patient underwent fixation of his humerus on 12/17/2021, pathology confirmed plasmacytoma/plasma cell neoplasm.  --Received 6 cycles of Darzalex, Revlimid, and dexamethasone started on 12/28/2021-05/18/2022.  --Due to worsening myeloma osseous lesions including pathologic fracture at T2 with cord compression, Dr. Lorenso Courier recommends changing therapy to Velcade/Pomalyst/Dex.  Plan: --Due for Cycle 2, Day 1 of Velcade/Pom/Dex today --labs from today showed white blood cell 2.5, hemoglobin 10.5, MCV 102.4, and platelets of 107 --restaging labs from 06/23/2022 with SPEP and SFLC showed M protein not detectable. Kappa decreased to 34.9 (from 233.8), Lambda 4.7, Ratio 7.43 --Will plan to drop his Pomalyst to 2 mg p.o. moving forward due to cytopenias.  Started on 4 mg p.o. --RTC in 2 weeks with interval weekly treatments  #Palliative Radiation Therapy: # Dysphagia --Received palliative radiation from 01/06/2022-01/19/2022 to right hymerus, right forearm and right chest/rib, 20 Gy in 10 Fx.  --recieved palliative radiation to surgical bed in the upper thoracic spine area on 07/13/2022.  27 Gy in 9 Fx.  -- Patient having sore throat, currently using swish and swallow lidocaine to help with the pain.  # Rash Concerning for Shingles- improving -- Prescribed valacyclovir 3 times daily 1000 mg x 10 days-completed the course -- We will continue to monitor rash closely.  #DVT: --Found to have acute DVT involving left peroneal veins. Likely secondary to recent surgery and hospitalization --Currently on eliquis 5 mg BID.   #Hypokalemia: --Currently on potassium  chloride 40 mEq twice daily as prescribed. Potassium level is normal today.   #Supportive Care -- chemotherapy education complete -- port placement not required -- zofran 8mg  q8H PRN and compazine 10mg  PO q6H for nausea -- acyclovir 400mg  PO BID for VCZ prophylaxis -- Started Zometa q 12 weeks on 05/18/2022. Next dose April 2024.    Orders Placed This Encounter  Procedures   Multiple Myeloma Panel (SPEP&IFE w/QIG)    Standing Status:   Future    Standing Expiration Date:   09/01/2023   Kappa/lambda light chains    Standing Status:   Future    Standing Expiration Date:   09/01/2023  CBC with Differential ([facility] Only)    Standing Status:   Future    Standing Expiration Date:   09/01/2023   CMP ([facility] only)    Standing Status:   Future    Standing Expiration Date:   09/01/2023   CBC with Differential ([facility] Only)    Standing Status:   Future    Standing Expiration Date:   09/08/2023   CMP ([facility] only)    Standing Status:   Future    Standing Expiration Date:   09/08/2023   CBC with Differential ([facility] Only)    Standing Status:   Future    Standing Expiration Date:   09/15/2023   CMP ([facility] only)    Standing Status:   Future    Standing Expiration Date:   09/15/2023   CBC with Differential ([facility] Only)    Standing Status:   Future    Standing Expiration Date:   09/22/2023   CMP ([facility] only)    Standing Status:   Future    Standing Expiration Date:   09/22/2023   Multiple Myeloma Panel (SPEP&IFE w/QIG)    Standing Status:   Future    Standing Expiration Date:   09/29/2023   Kappa/lambda light chains    Standing Status:   Future    Standing Expiration Date:   09/29/2023   CBC with Differential ([facility] Only)    Standing Status:   Future    Standing Expiration Date:   09/29/2023   CMP ([facility] only)    Standing Status:   Future    Standing Expiration Date:   09/29/2023   CBC with Differential ([facility] Only)     Standing Status:   Future    Standing Expiration Date:   10/06/2023   CMP ([facility] only)    Standing Status:   Future    Standing Expiration Date:   10/06/2023   CBC with Differential ([facility] Only)    Standing Status:   Future    Standing Expiration Date:   10/13/2023   CMP ([facility] only)    Standing Status:   Future    Standing Expiration Date:   10/13/2023   CBC with Differential ([facility] Only)    Standing Status:   Future    Standing Expiration Date:   10/20/2023   CMP ([facility] only)    Standing Status:   Future    Standing Expiration Date:   10/20/2023   Multiple Myeloma Panel (SPEP&IFE w/QIG)    Standing Status:   Future    Standing Expiration Date:   10/27/2023   Kappa/lambda light chains    Standing Status:   Future    Standing Expiration Date:   10/27/2023   CBC with Differential ([facility] Only)    Standing Status:   Future    Standing Expiration Date:   10/27/2023   CMP ([facility] [place] only)    Standing Status:   Future    Standing Expiration Date:   10/27/2023   CBC with Differential ([facility] Only)    Standing Status:   Future    Standing Expiration Date:   11/03/2023   CMP ([facility] only)    Standing Status:   Future    Standing Expiration Date:   11/03/2023   CBC with Differential ([facility] Only)    Standing Status:   Future    Standing Expiration Date:   11/10/2023   CMP ([facility] only)    Standing Status:   Future    Standing Expiration Date:   11/10/2023   CBC with Differential ([facility] Only)    Standing Status:   Future    Standing Expiration Date:   11/17/2023   CMP ([facility] only)    Standing Status:   Future    Standing Expiration Date:   11/17/2023    All questions were answered. The patient knows to call the clinic with any problems, questions or concerns.  A total of more than 30 minutes were spent on this encounter with face-to-face time and non-face-to-face time, including preparing to see  the patient, ordering tests and/or medications, counseling the patient and coordination of care as outlined above.   Ledell Peoples, MD Department of Hematology/Oncology [facility] Phone: [phone] Pager: [phone] Email: [email or web address]    08/04/2022 4:45 PM

## 2022-08-04 NOTE — Patient Instructions (Signed)
[facility]  Discharge Instructions: Thank you for choosing [facility] to provide your oncology and hematology care.   If you have a lab appointment with the Cancer Center, please go directly to the Cancer Center and check in at the registration area.   Wear comfortable clothing and clothing appropriate for easy access to any Portacath or PICC line.   We strive to give you quality time with your provider. You may need to reschedule your appointment if you arrive late (15 or more minutes).  Arriving late affects you and other patients whose appointments are after yours.  Also, if you miss three or more appointments without notifying the office, you may be dismissed from the clinic at the provider's discretion.      For prescription refill requests, have your pharmacy contact our office and allow 72 hours for refills to be completed.    Today you received the following chemotherapy and/or immunotherapy agents: Velcade     To help prevent nausea and vomiting after your treatment, we encourage you to take your nausea medication as directed.  BELOW ARE SYMPTOMS THAT SHOULD BE REPORTED IMMEDIATELY: *FEVER GREATER THAN 100.4 F (38 C) OR HIGHER *CHILLS OR SWEATING *NAUSEA AND VOMITING THAT IS NOT CONTROLLED WITH YOUR NAUSEA MEDICATION *UNUSUAL SHORTNESS OF BREATH *UNUSUAL BRUISING OR BLEEDING *URINARY PROBLEMS (pain or burning when urinating, or frequent urination) *BOWEL PROBLEMS (unusual diarrhea, constipation, pain near the anus) TENDERNESS IN MOUTH AND THROAT WITH OR WITHOUT PRESENCE OF ULCERS (sore throat, sores in mouth, or a toothache) UNUSUAL RASH, SWELLING OR PAIN  UNUSUAL VAGINAL DISCHARGE OR ITCHING   Items with * indicate a potential emergency and should be followed up as soon as possible or go to the Emergency Department if any problems should occur.  Please show the CHEMOTHERAPY ALERT CARD or IMMUNOTHERAPY ALERT CARD at check-in  to the Emergency Department and triage nurse.  Should you have questions after your visit or need to cancel or reschedule your appointment, please contact [facility]  Dept: [phone]  and follow the prompts.  Office hours are 8:00 a.m. to 4:30 p.m. Monday - Friday. Please note that voicemails left after 4:00 p.m. may not be returned until the following business day.  We are closed weekends and major holidays. You have access to a nurse at all times for urgent questions. Please call the main number to the clinic Dept: [phone] and follow the prompts.   For any non-urgent questions, you may also contact your provider using MyChart. We now offer e-Visits for anyone 18 and older to request care online for non-urgent symptoms. For details visit [email or web address].   Also download the MyChart app! Go to the app store, search "MyChart", open the app, select [facility], and log in with your MyChart username and password.   

## 2022-08-04 NOTE — Progress Notes (Signed)
Per Lorenso Courier, MD okay to treat with Neutrophils 1.2

## 2022-08-05 ENCOUNTER — Telehealth: Payer: Self-pay | Admitting: Internal Medicine

## 2022-08-05 ENCOUNTER — Telehealth: Payer: Self-pay | Admitting: *Deleted

## 2022-08-05 DIAGNOSIS — H6502 Acute serous otitis media, left ear: Secondary | ICD-10-CM | POA: Diagnosis not present

## 2022-08-05 DIAGNOSIS — H9202 Otalgia, left ear: Secondary | ICD-10-CM | POA: Diagnosis not present

## 2022-08-05 LAB — KAPPA/LAMBDA LIGHT CHAINS
Kappa free light chain: 16.1 mg/L (ref 3.3–19.4)
Kappa, lambda light chain ratio: 4.24 — ABNORMAL HIGH (ref 0.26–1.65)
Lambda free light chains: 3.8 mg/L — ABNORMAL LOW (ref 5.7–26.3)

## 2022-08-05 MED ORDER — POTASSIUM CHLORIDE CRYS ER 20 MEQ PO TBCR: 40.0000 meq | EXTENDED_RELEASE_TABLET | Freq: Every day | ORAL | 3 refills | Status: DC

## 2022-08-05 MED ORDER — FUROSEMIDE 40 MG PO TABS: 80.0000 mg | ORAL_TABLET | Freq: Every day | ORAL | 0 refills | Status: DC

## 2022-08-05 NOTE — Telephone Encounter (Signed)
Left a message for the pt to be sure he got Dr Harrington Challenger' recommendations.   Will also send a My Chart.

## 2022-08-05 NOTE — Telephone Encounter (Signed)
Patient called in with wife He has had swelling in ankles, says face is even puffy Lasix 40 not working   He tried 60 mg today   Didn't do much  Recomm   Take 80 mg daily   Continue 40 meq KCL     Call in with progress

## 2022-08-05 NOTE — Telephone Encounter (Signed)
Pt states his MD wants to start him on a steroid dose pack for an ear blockage. Wants to know if that is OK with Dr Lorenso Courier as he is currently taking chemo.

## 2022-08-05 NOTE — Telephone Encounter (Signed)
LM for Mr Travis [name]. OK to take steroid dose pack per Dr Lorenso Courier

## 2022-08-07 DIAGNOSIS — M1990 Unspecified osteoarthritis, unspecified site: Secondary | ICD-10-CM | POA: Diagnosis not present

## 2022-08-07 DIAGNOSIS — I119 Hypertensive heart disease without heart failure: Secondary | ICD-10-CM | POA: Diagnosis not present

## 2022-08-07 DIAGNOSIS — Z4789 Encounter for other orthopedic aftercare: Secondary | ICD-10-CM | POA: Diagnosis not present

## 2022-08-07 DIAGNOSIS — C9000 Multiple myeloma not having achieved remission: Secondary | ICD-10-CM | POA: Diagnosis not present

## 2022-08-07 DIAGNOSIS — E559 Vitamin D deficiency, unspecified: Secondary | ICD-10-CM | POA: Diagnosis not present

## 2022-08-07 DIAGNOSIS — I480 Paroxysmal atrial fibrillation: Secondary | ICD-10-CM | POA: Diagnosis not present

## 2022-08-07 DIAGNOSIS — Z981 Arthrodesis status: Secondary | ICD-10-CM | POA: Diagnosis not present

## 2022-08-07 DIAGNOSIS — G4733 Obstructive sleep apnea (adult) (pediatric): Secondary | ICD-10-CM | POA: Diagnosis not present

## 2022-08-07 DIAGNOSIS — G629 Polyneuropathy, unspecified: Secondary | ICD-10-CM | POA: Diagnosis not present

## 2022-08-09 ENCOUNTER — Other Ambulatory Visit: Payer: Medicare Other

## 2022-08-09 ENCOUNTER — Telehealth: Payer: Self-pay | Admitting: Hematology and Oncology

## 2022-08-09 ENCOUNTER — Ambulatory Visit: Payer: Medicare Other | Admitting: Hematology and Oncology

## 2022-08-09 ENCOUNTER — Ambulatory Visit: Payer: Medicare Other

## 2022-08-09 LAB — MULTIPLE MYELOMA PANEL, SERUM
Albumin SerPl Elph-Mcnc: 2.7 g/dL — ABNORMAL LOW (ref 2.9–4.4)
Albumin/Glob SerPl: 1.5 (ref 0.7–1.7)
Alpha 1: 0.3 g/dL (ref 0.0–0.4)
Alpha2 Glob SerPl Elph-Mcnc: 0.7 g/dL (ref 0.4–1.0)
B-Globulin SerPl Elph-Mcnc: 0.7 g/dL (ref 0.7–1.3)
Gamma Glob SerPl Elph-Mcnc: 0.2 g/dL — ABNORMAL LOW (ref 0.4–1.8)
Globulin, Total: 1.9 g/dL — ABNORMAL LOW (ref 2.2–3.9)
IgA: 18 mg/dL — ABNORMAL LOW (ref 61–437)
IgG (Immunoglobin G), Serum: 185 mg/dL — ABNORMAL LOW (ref 603–1613)
IgM (Immunoglobulin M), Srm: 18 mg/dL (ref 15–143)
Total Protein ELP: 4.6 g/dL — ABNORMAL LOW (ref 6.0–8.5)

## 2022-08-09 NOTE — Telephone Encounter (Signed)
Reached out to patient to schedule per WQ left voicemail. 

## 2022-08-10 ENCOUNTER — Ambulatory Visit: Payer: Medicare Other | Admitting: Neurology

## 2022-08-10 ENCOUNTER — Ambulatory Visit: Payer: Medicare Other | Attending: Internal Medicine | Admitting: Internal Medicine

## 2022-08-10 ENCOUNTER — Telehealth: Payer: Self-pay | Admitting: Physician Assistant

## 2022-08-10 ENCOUNTER — Encounter: Payer: Self-pay | Admitting: Neurology

## 2022-08-10 ENCOUNTER — Encounter: Payer: Self-pay | Admitting: Internal Medicine

## 2022-08-10 VITALS — BP 138/62 | HR 57 | Ht 70.0 in | Wt 243.0 lb

## 2022-08-10 VITALS — BP 128/72 | HR 53 | Ht 70.0 in | Wt 244.0 lb

## 2022-08-10 DIAGNOSIS — G4733 Obstructive sleep apnea (adult) (pediatric): Secondary | ICD-10-CM | POA: Diagnosis not present

## 2022-08-10 DIAGNOSIS — Z981 Arthrodesis status: Secondary | ICD-10-CM | POA: Diagnosis not present

## 2022-08-10 DIAGNOSIS — C9000 Multiple myeloma not having achieved remission: Secondary | ICD-10-CM | POA: Diagnosis not present

## 2022-08-10 DIAGNOSIS — I82402 Acute embolism and thrombosis of unspecified deep veins of left lower extremity: Secondary | ICD-10-CM | POA: Diagnosis not present

## 2022-08-10 DIAGNOSIS — I5033 Acute on chronic diastolic (congestive) heart failure: Secondary | ICD-10-CM | POA: Diagnosis not present

## 2022-08-10 DIAGNOSIS — G629 Polyneuropathy, unspecified: Secondary | ICD-10-CM | POA: Diagnosis not present

## 2022-08-10 DIAGNOSIS — I480 Paroxysmal atrial fibrillation: Secondary | ICD-10-CM | POA: Diagnosis not present

## 2022-08-10 DIAGNOSIS — Z7901 Long term (current) use of anticoagulants: Secondary | ICD-10-CM | POA: Diagnosis not present

## 2022-08-10 DIAGNOSIS — G63 Polyneuropathy in diseases classified elsewhere: Secondary | ICD-10-CM

## 2022-08-10 DIAGNOSIS — I110 Hypertensive heart disease with heart failure: Secondary | ICD-10-CM | POA: Diagnosis not present

## 2022-08-10 DIAGNOSIS — Z4789 Encounter for other orthopedic aftercare: Secondary | ICD-10-CM | POA: Diagnosis not present

## 2022-08-10 NOTE — Telephone Encounter (Signed)
Rescheduled 04/10 appointment time per

## 2022-08-10 NOTE — Patient Instructions (Signed)
Medication Instructions:   *If you need a refill on your cardiac medications before your next appointment, please call your pharmacy*   Lab Work:  If you have labs (blood work) drawn today and your tests are completely normal, you will receive your results only by: MyChart Message (if you have MyChart) OR A paper copy in the mail If you have any lab test that is abnormal or we need to change your treatment, we will call you to review the results.   Testing/Procedures:    Follow-Up: At [facility], you and your health needs are our priority.  As part of our continuing mission to provide you with exceptional heart care, we have created designated Provider Care Teams.  These Care Teams include your primary Cardiologist (physician) and Advanced Practice Providers (APPs -  Physician Assistants and Nurse Practitioners) who all work together to provide you with the care you need, when you need it.  We recommend signing up for the patient portal called "MyChart".  Sign up information is provided on this After Visit Summary.  MyChart is used to connect with patients for Virtual Visits (Telemedicine).  Patients are able to view lab/test results, encounter notes, upcoming appointments, etc.  Non-urgent messages can be sent to your provider as well.   To learn more about what you can do with MyChart, go to [email or web address].    

## 2022-08-10 NOTE — Progress Notes (Signed)
a   Cardiology Office Note   Date:  08/10/2022   ID:  Travis [name], DOB 12-Dec-1950, MRN [id number]  PCP:  Emilio AspenHenderson, Jonathan A, MD  Cardiologist:   Dietrich PatesPaula Burlin Mcnair, MD   Pt presents fore evaluation of edema   History of Present Illness: Travis [name] is a 72 y.o. male with a history of  HTN, OSA and PAF (CHADSVASc score 4; s/p Afib ablation 2018 ).   The pt had Cardiac CTA in 2018 which showed no CAD  Calcium score was 0  Pt also has hx of DVT, peripheral neuropathy and Multiple Myeloma   Fall 2023 [facility]  Volume overloaded at time    Diuresed   Converted to SR  B blocker stopped due to bradycardia then resumed          I saw the pt in Dec 2023  Back in afib   Flecanide stopped and amiodarone started      BP was high  Hydrodiuril stopped due to low K   Recomm hydralazine 10 as tid Increased to 20mg   tid     Seen by [name] Lambert in Jan 2024  Recomm decreasing amiodarone dose to 200 daily    Increase hydralazine to 37.5 tid    \  The pt was hospitalized in march 2024 for pneumonia   IN afib  Treated with IV amiodarone then transitioned to po      The pt called last week with LE swelling  Said that lasix not helping   I recomm increasing to 80 mg daily      Since seen he says the lasix works erratically     Some days he says he doesn't urinate as much     Breathing is OK    He was in afib over the weekend and had a significant diuresis    Allergies:   Linezolid and Vancomycin   Past Medical History:  Diagnosis Date   A-fib    Arthritis    "comes and goes; mostly in hands, occasionally elbow" (04/05/2017)   Complication of anesthesia    "just wanted to sleep alot  for couple days S/P VARICOCELE OR" (04/05/2017)   DVT (deep venous thrombosis)    LLE   Dysrhythmia    PVCs    GERD (gastroesophageal reflux disease)    History of hiatal hernia    Hypertension    Impaired glucose tolerance    Multiple myeloma    Obesity (BMI 30-39.9) 07/26/2016    OSA on CPAP 07/26/2016   Mild with AHI 12.5/hr now on CPAP at 14cm H2O   PAF (paroxysmal atrial fibrillation)    s/p PVI Ablation in 2018 // Flecainide Rx // Echo 8/21: EF 60-65, no RWMA, mild LVH, normal RV SF, moderate RVE, mild BAE, trivial MR, mild dilation of aortic root (40 mm)   Pneumonia    "may have had walking pneumonia a few years ago" (04/05/2017)   Pre-diabetes    Thrombophlebitis     Past Surgical History:  Procedure Laterality Date   ATRIAL FIBRILLATION ABLATION  04/05/2017   ATRIAL FIBRILLATION ABLATION N/A 04/05/2017   Procedure: ATRIAL FIBRILLATION ABLATION;  Surgeon: Hillis RangeAllred, James, MD;  [facility]: [facility];  Service: Cardiovascular;  Laterality: N/A;   BONE BIOPSY Right 12/17/2021   Procedure: BONE BIOPSY;  Surgeon: Bjorn PippinVarkey, Dax T, MD;  Location: [facility];  Service: Orthopedics;  Laterality: Right;   COMPLETE RIGHT  HIP REPLACMENT  01/19/2019   EVALUATION UNDER ANESTHESIA WITH HEMORRHOIDECTOMY N/A 02/24/2017   Procedure: EXAM UNDER ANESTHESIA WITH  HEMORRHOIDECTOMY;  Surgeon: [name], MD;  Location: [facility];  Service: General;  Laterality: N/A;   EXCISIONAL TOTAL HIP ARTHROPLASTY WITH ANTIBIOTIC SPACERS Right 09/25/2020   Procedure: EXCISIONAL TOTAL HIP ARTHROPLASTY WITH ANTIBIOTIC SPACERS;  Surgeon: [name], MD;  Location: [facility];  Service: Orthopedics;  Laterality: Right;   FLEXIBLE SIGMOIDOSCOPY N/A 04/15/2015   Procedure:  UNSEDATED FLEXIBLE SIGMOIDOSCOPY;  Surgeon: [name], MD;  Location: [facility];  Service: Endoscopy;  Laterality: N/A;   HUMERUS IM NAIL Right 12/17/2021   Procedure: INTRAMEDULLARY (IM) NAIL HUMERAL;  Surgeon: [name], MD;  Location: [facility];  Service: Orthopedics;  Laterality: Right;   KNEE ARTHROSCOPY Left ~ 2016   POSTERIOR CERVICAL FUSION/FORAMINOTOMY N/A 06/04/2022   Procedure: Thoracic one - Thoracic Four Posterior lateral arthrodesis/ fusion and Thoracic two Corpectomy;   Surgeon: [name], MD;  Location: [facility];  Service: Neurosurgery;  Laterality: N/A;   REIMPLANTATION OF TOTAL HIP Right 12/25/2020   Procedure: REIMPLANTATION/REVISION OF RIGHT TOTAL HIP VERSUS REPEAT IRRIGATION AND DEBRIDEMENT;  Surgeon: [name], MD;  Location: [facility];  Service: Orthopedics;  Laterality: Right;   TESTICLE SURGERY  1988   VARICOCELE   TONSILLECTOMY     VARICOSE VEIN SURGERY Bilateral      Social History:  The patient  reports that he has never smoked. He has never used smokeless tobacco. He reports current alcohol use of about 2.0 standard drinks of alcohol per week. He reports that he does not use drugs.   Family History:  The patient's family history includes Atrial fibrillation in his father; Dementia in his maternal grandmother and mother; Hypertension in his father and paternal grandfather; Stroke in his father.    ROS:  Please see the history of present illness. All other systems are reviewed and  Negative to the above problem except as noted.    PHYSICAL EXAM: VS:  BP 128/72   Pulse (!) 53   Ht 5\' 10"  (1.778 m)   Wt 244 lb (110.7 kg)   SpO2 97%   BMI 35.01 kg/m   GEN: Obese 72 yo in no acute distress  HEENT: normal  Neck:  JVP is normal  Cardiac: RRR; no murmur   1-2+ LE edema    Respiratory:  clear to auscultation  GI: Supple     No hepatomegaly   EKG:  EKG is ordered today   SInus bradycardia with occasional PAC 53 bpm      Lipid Panel No results found for: "CHOL", "TRIG", "HDL", "CHOLHDL", "VLDL", "LDLCALC", "LDLDIRECT"    Wt Readings from Last 3 Encounters:  08/10/22 244 lb (110.7 kg)  08/10/22 243 lb (110.2 kg)  08/04/22 242 lb (109.8 kg)      ASSESSMENT AND PLAN:   Edema   Pt with continued LE edema   Lungs clear    He is due to get labs tomorrow   Wil lreview     Most likely add Zaroxylyn 2.5 mg   Will contact the pt  Limit salt  2  PAF  Maintaining SR most of the time    Keep on amiodarone and Eliquis    3  HTN  BP is  controlled    4  Hx of Bradycardia  HR in 50s  BPis OK      5.  Lipids.  Excellent  LDL 61, HDL 46     6.  OSA.   Continue CPAP   Current medicines are reviewed at length with the patient today.  The patient does not have concerns regarding medicines.  Signed, [name], MD  08/10/2022 9:10 PM    [facility] [address] [place], [place], [place]  [address] Phone: [phone]; Fax: [phone]

## 2022-08-10 NOTE — Progress Notes (Signed)
Follow-up Visit   Date: 08/10/22   Travis [name] MRN: [id number] DOB: December 02, 1950   Interim History: Travis [name] is a 72 y.o. with history of atrial fibrillation s/p ablation (on eliquis), OSA on CPAP, GERD, multiple myeloma, alcohol use, and hypertension returning to the clinic for follow-up of neuropathy.  The patient was accompanied to the clinic by self.   IMPRESSION/PLAN: 1.  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  Over the past years, symptoms have intensified with worsening numbness in the feet, likely exacerbated by Valcade.    - Pain is well-controlled on gabapentin 600mg  BID which will be continued  - With upcoming travel plans, I asked patient to arrange for wheelchair for airport/cruise   - Fall precautions discussed  2.  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 Franky Macho  Return to clinic in 1 year  ------------------------------------------------------------------- UPDATE 08/10/2022:  He is here for follow-up visit. Patient was doing well until July 2023, when he developed right arm pain and found to have lytic lesion involving the right humerus.  He was also found to have rib lesions and in December plasmacytoma at T2 causing cord compression s/p decompression.  He has since been on chemotherapy and radiation.  His neuropathy is worse such that he has more numbness/tingling in the feet.  He denies pain in the feet.  He remains on gabapentin 600mg  twice daily.  He uses a walker for balance.   He has an upcoming trip to [place] next month and [place] later this year.    He has also been having a lot of swelling in the legs.  He is taking lasix 80mg  daily and wears compression socks.     Medications:  Current Outpatient Medications on File Prior to Visit  Medication Sig Dispense Refill   acetaminophen (TYLENOL) 325 MG tablet Take 2 tablets (650 mg total) by mouth every 6 (six) hours as needed. 36  tablet 0   acyclovir (ZOVIRAX) 400 MG tablet Take 1 tablet (400 mg total) by mouth 2 (two) times daily. 180 tablet 3   amiodarone (PACERONE) 200 MG tablet Take 1 tablet (200 mg total) by mouth daily. Start after finishing 40 mg twice daily dose of  days 180 tablet 1   apixaban (ELIQUIS) 5 MG TABS tablet Take 1 tablet (5 mg total) by mouth 2 (two) times daily. 60 tablet 11   cholecalciferol (VITAMIN D3) 25 MCG (1000 UNIT) tablet Take 1,000 Units by mouth daily.     Cyanocobalamin (VITAMIN B-12 PO) Take 1,000 mcg by mouth daily.     dexamethasone (DECADRON) 4 MG tablet Take 40 mg by mouth See admin instructions. Take 40 mg by mouth once a day the morning of chemo on Wednesday- or unless otherwise instructed     fluticasone (FLONASE) 50 MCG/ACT nasal spray Place 1 spray into both nostrils daily as needed for allergies or rhinitis.     folic acid (FOLVITE) 1 MG tablet Take 1 mg by mouth daily.     furosemide (LASIX) 40 MG tablet Take 2 tablets (80 mg total) by mouth daily. 180 tablet 0   gabapentin (NEURONTIN) 300 MG capsule TAKE TWO CAPSULES BY MOUTH TWICE A DAY 360 capsule 1   hydrALAZINE (APRESOLINE) 25 MG tablet Take 1 tablet (25 mg total) by mouth every 8 (eight) hours. 90 tablet 0   lidocaine (XYLOCAINE) 2 % solution Use as directed 15 mLs in the mouth or throat 4 (four) times  daily -  before meals and at bedtime. 250 mL 1   lisinopril (ZESTRIL) 40 MG tablet TAKE ONE TABLET BY MOUTH ONE TIME DAILY 90 tablet 3   methocarbamol (ROBAXIN) 750 MG tablet Take 750 mg by mouth every 8 (eight) hours as needed for muscle spasms.     metoprolol tartrate (LOPRESSOR) 25 MG tablet Take 1 tablet (25 mg total) by mouth 2 (two) times daily. (Patient taking differently: Take 50 mg by mouth 2 (two) times daily.) 180 tablet 3   Omega-3 Fatty Acids (FISH OIL PO) Take 1 Capful by mouth daily.     pomalidomide (POMALYST) 2 MG capsule Take 1 capsule (2 mg total) by mouth daily. Take one tablet (2 mg by mouth) daily for  21 days, then take 7 days off 21 capsule 0   potassium chloride SA (KLOR-CON M) 20 MEQ tablet Take 2 tablets (40 mEq total) by mouth daily. 180 tablet 3   sodium chloride (OCEAN) 0.65 % SOLN nasal spray Place 1 spray into both nostrils as needed for congestion.     No current facility-administered medications on file prior to visit.    Allergies:  Allergies  Allergen Reactions   Linezolid Other (See Comments)    Burning tongue    Vancomycin Rash    Other Reaction(s): rash, itching, fever, chills    Vital Signs:  BP (!) 152/71   Pulse (!) 57   Ht  (1.778 m)   Wt 243 lb (110.2 kg)   SpO2 97%   BMI 34.87 kg/m    Neurological Exam: MENTAL STATUS including orientation to time, place, person, recent and remote memory, attention span and concentration, language, and fund of knowledge is normal.  Speech is not dysarthric.  CRANIAL NERVES:   Pupils equal round and reactive.  Normal conjugate, extra-ocular eye movements in all directions of gaze.  No ptosis   MOTOR:  Motor strength is 5/5 in all extremities, including distally in the feet.  No atrophy, fasciculations or abnormal movements.  No pronator drift.  Tone is normal.    MSRs:                                           Right        Left brachioradialis 3+  3+  biceps 2+  2+  triceps 2+  2+  patellar 2+  2+  ankle jerk 0  0   SENSORY:  Vibration absent at the ankles bilaterally, intact at the knees and MCP.  COORDINATION/GAIT:  Gait is wide-based, assisted with rollator.  Data: Lab Results  Component Value Date   VITAMINB12 256 06/08/2022   Lab Results  Component Value Date   FOLATE 13.1 06/08/2022   MRI lumbar spine wwo contrast 10/12/2017: 1. Lumbar spondylosis and degenerative disc disease causing mild  impingement at L4-5 and L5-S1.  2. There is some marrow heterogeneity but no focal enhancing lesion  characteristic of lumbar myeloma is identified.  3. Mild levoconvex lumbar scoliosis.   MRI lumbar  spine wo contrast 04/23/2020: 1. No acute abnormality or significant change from prior MRI. 2. Mild multilevel degenerative changes of the lumbar spine with mild narrowing of the bilateral subarticular zones at L4-5. 3. No significant spinal canal or neural foraminal stenosis at any Level.  MRI cervical spine 03/06/2020: Left foraminal narrowing C2-3 and C3-4   Moderate spinal stenosis C4-5. Moderate  foraminal stenosis bilaterally right greater than left due to spurring   Mild spinal stenosis C5-6 with mild foraminal stenosis bilaterally.  NCS/EMG of the arms 04/04/2020: Chronic C5 radiculopathy affecting bilateral upper extremities, mild. 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    Total time spent reviewing records, interview, history/exam, documentation, and coordination of care on day of encounter:  30 min   Thank you for allowing me to participate in patient's care.  If I can answer any additional questions, I would be pleased to do so.    Sincerely,    Jannett Schmall K. Allena KatzPatel, DO

## 2022-08-10 NOTE — Patient Instructions (Signed)
It was great to see you today.  Have a great trip next month!    Please call your travel agent to arrange wheelchair for your trip (airport, cruise, bus, etc).  I will see you back in 1 year

## 2022-08-11 ENCOUNTER — Encounter: Payer: Self-pay | Admitting: Physician Assistant

## 2022-08-11 ENCOUNTER — Other Ambulatory Visit: Payer: Self-pay

## 2022-08-11 ENCOUNTER — Inpatient Hospital Stay: Payer: Medicare Other

## 2022-08-11 ENCOUNTER — Other Ambulatory Visit: Payer: Self-pay | Admitting: *Deleted

## 2022-08-11 ENCOUNTER — Encounter: Payer: Self-pay | Admitting: Internal Medicine

## 2022-08-11 VITALS — BP 166/74 | HR 54 | Temp 98.1°F | Resp 16 | Ht 70.0 in | Wt 241.0 lb

## 2022-08-11 DIAGNOSIS — E876 Hypokalemia: Secondary | ICD-10-CM | POA: Diagnosis not present

## 2022-08-11 DIAGNOSIS — C9000 Multiple myeloma not having achieved remission: Secondary | ICD-10-CM

## 2022-08-11 DIAGNOSIS — Z79899 Other long term (current) drug therapy: Secondary | ICD-10-CM | POA: Diagnosis not present

## 2022-08-11 DIAGNOSIS — J90 Pleural effusion, not elsewhere classified: Secondary | ICD-10-CM | POA: Diagnosis not present

## 2022-08-11 DIAGNOSIS — Z5112 Encounter for antineoplastic immunotherapy: Secondary | ICD-10-CM | POA: Diagnosis not present

## 2022-08-11 DIAGNOSIS — R200 Anesthesia of skin: Secondary | ICD-10-CM | POA: Diagnosis not present

## 2022-08-11 DIAGNOSIS — I480 Paroxysmal atrial fibrillation: Secondary | ICD-10-CM

## 2022-08-11 DIAGNOSIS — Z515 Encounter for palliative care: Secondary | ICD-10-CM | POA: Diagnosis not present

## 2022-08-11 DIAGNOSIS — K5900 Constipation, unspecified: Secondary | ICD-10-CM | POA: Diagnosis not present

## 2022-08-11 DIAGNOSIS — R600 Localized edema: Secondary | ICD-10-CM

## 2022-08-11 DIAGNOSIS — M533 Sacrococcygeal disorders, not elsewhere classified: Secondary | ICD-10-CM | POA: Diagnosis not present

## 2022-08-11 DIAGNOSIS — Z7961 Long term (current) use of immunomodulator: Secondary | ICD-10-CM | POA: Diagnosis not present

## 2022-08-11 DIAGNOSIS — M5126 Other intervertebral disc displacement, lumbar region: Secondary | ICD-10-CM | POA: Diagnosis not present

## 2022-08-11 DIAGNOSIS — I10 Essential (primary) hypertension: Secondary | ICD-10-CM | POA: Diagnosis not present

## 2022-08-11 DIAGNOSIS — Z86718 Personal history of other venous thrombosis and embolism: Secondary | ICD-10-CM | POA: Diagnosis not present

## 2022-08-11 DIAGNOSIS — Z923 Personal history of irradiation: Secondary | ICD-10-CM | POA: Diagnosis not present

## 2022-08-11 DIAGNOSIS — R21 Rash and other nonspecific skin eruption: Secondary | ICD-10-CM | POA: Diagnosis not present

## 2022-08-11 DIAGNOSIS — Z8249 Family history of ischemic heart disease and other diseases of the circulatory system: Secondary | ICD-10-CM | POA: Diagnosis not present

## 2022-08-11 DIAGNOSIS — R1310 Dysphagia, unspecified: Secondary | ICD-10-CM | POA: Diagnosis not present

## 2022-08-11 DIAGNOSIS — Z881 Allergy status to other antibiotic agents status: Secondary | ICD-10-CM | POA: Diagnosis not present

## 2022-08-11 DIAGNOSIS — Z823 Family history of stroke: Secondary | ICD-10-CM | POA: Diagnosis not present

## 2022-08-11 DIAGNOSIS — Z7901 Long term (current) use of anticoagulants: Secondary | ICD-10-CM | POA: Diagnosis not present

## 2022-08-11 LAB — CBC WITH DIFFERENTIAL (CANCER CENTER ONLY)
Abs Immature Granulocytes: 0.04 10*3/uL (ref 0.00–0.07)
Basophils Absolute: 0.1 10*3/uL (ref 0.0–0.1)
Basophils Relative: 1 %
Eosinophils Absolute: 0.1 10*3/uL (ref 0.0–0.5)
Eosinophils Relative: 1 %
HCT: 34.3 % — ABNORMAL LOW (ref 39.0–52.0)
Hemoglobin: 10.9 g/dL — ABNORMAL LOW (ref 13.0–17.0)
Immature Granulocytes: 1 %
Lymphocytes Relative: 11 %
Lymphs Abs: 0.7 10*3/uL (ref 0.7–4.0)
MCH: 32.3 pg (ref 26.0–34.0)
MCHC: 31.8 g/dL (ref 30.0–36.0)
MCV: 101.8 fL — ABNORMAL HIGH (ref 80.0–100.0)
Monocytes Absolute: 1.2 10*3/uL — ABNORMAL HIGH (ref 0.1–1.0)
Monocytes Relative: 19 %
Neutro Abs: 4.3 10*3/uL (ref 1.7–7.7)
Neutrophils Relative %: 67 %
Platelet Count: 194 10*3/uL (ref 150–400)
RBC: 3.37 MIL/uL — ABNORMAL LOW (ref 4.22–5.81)
RDW: 17.1 % — ABNORMAL HIGH (ref 11.5–15.5)
WBC Count: 6.4 10*3/uL (ref 4.0–10.5)
nRBC: 0 % (ref 0.0–0.2)

## 2022-08-11 LAB — CMP (CANCER CENTER ONLY)
ALT: 19 U/L (ref 0–44)
AST: 11 U/L — ABNORMAL LOW (ref 15–41)
Albumin: 3.5 g/dL (ref 3.5–5.0)
Alkaline Phosphatase: 77 U/L (ref 38–126)
Anion gap: 6 (ref 5–15)
BUN: 21 mg/dL (ref 8–23)
CO2: 31 mmol/L (ref 22–32)
Calcium: 8.6 mg/dL — ABNORMAL LOW (ref 8.9–10.3)
Chloride: 105 mmol/L (ref 98–111)
Creatinine: 0.9 mg/dL (ref 0.61–1.24)
GFR, Estimated: >60 mL/min (ref >60)
Glucose, Bld: 88 mg/dL (ref 70–99)
Potassium: 3.4 mmol/L — ABNORMAL LOW (ref 3.5–5.1)
Sodium: 142 mmol/L (ref 135–145)
Total Bilirubin: 0.3 mg/dL (ref 0.3–1.2)
Total Protein: 5.5 g/dL — ABNORMAL LOW (ref 6.5–8.1)

## 2022-08-11 MED ORDER — BORTEZOMIB CHEMO SQ INJECTION 3.5 MG (2.5MG/ML)
1.3000 mg/m2 | Freq: Once | INTRAMUSCULAR | Status: AC
  Administered 2022-08-11: 3 mg via SUBCUTANEOUS
  Filled 2022-08-11: qty 1.2

## 2022-08-11 NOTE — Patient Instructions (Signed)
[facility]  Discharge Instructions: Thank you for choosing [facility] to provide your oncology and hematology care.   If you have a lab appointment with the Cancer Center, please go directly to the Cancer Center and check in at the registration area.   Wear comfortable clothing and clothing appropriate for easy access to any Portacath or PICC line.   We strive to give you quality time with your provider. You may need to reschedule your appointment if you arrive late (15 or more minutes).  Arriving late affects you and other patients whose appointments are after yours.  Also, if you miss three or more appointments without notifying the office, you may be dismissed from the clinic at the provider's discretion.      For prescription refill requests, have your pharmacy contact our office and allow 72 hours for refills to be completed.    Today you received the following chemotherapy and/or immunotherapy agents: Velcade     To help prevent nausea and vomiting after your treatment, we encourage you to take your nausea medication as directed.  BELOW ARE SYMPTOMS THAT SHOULD BE REPORTED IMMEDIATELY: *FEVER GREATER THAN 100.4 F (38 C) OR HIGHER *CHILLS OR SWEATING *NAUSEA AND VOMITING THAT IS NOT CONTROLLED WITH YOUR NAUSEA MEDICATION *UNUSUAL SHORTNESS OF BREATH *UNUSUAL BRUISING OR BLEEDING *URINARY PROBLEMS (pain or burning when urinating, or frequent urination) *BOWEL PROBLEMS (unusual diarrhea, constipation, pain near the anus) TENDERNESS IN MOUTH AND THROAT WITH OR WITHOUT PRESENCE OF ULCERS (sore throat, sores in mouth, or a toothache) UNUSUAL RASH, SWELLING OR PAIN  UNUSUAL VAGINAL DISCHARGE OR ITCHING   Items with * indicate a potential emergency and should be followed up as soon as possible or go to the Emergency Department if any problems should occur.  Please show the CHEMOTHERAPY ALERT CARD or IMMUNOTHERAPY ALERT CARD at check-in  to the Emergency Department and triage nurse.  Should you have questions after your visit or need to cancel or reschedule your appointment, please contact [facility]  Dept: [phone]  and follow the prompts.  Office hours are 8:00 a.m. to 4:30 p.m. Monday - Friday. Please note that voicemails left after 4:00 p.m. may not be returned until the following business day.  We are closed weekends and major holidays. You have access to a nurse at all times for urgent questions. Please call the main number to the clinic Dept: [phone] and follow the prompts.   For any non-urgent questions, you may also contact your provider using MyChart. We now offer e-Visits for anyone 18 and older to request care online for non-urgent symptoms. For details visit [email or web address].   Also download the MyChart app! Go to the app store, search "MyChart", open the app, select [facility], and log in with your MyChart username and password.   

## 2022-08-11 NOTE — Progress Notes (Signed)
Patient presents for Velcade injection. Patient complains of pain, redness & dryness on left lower quadrant at last weeks Velcade injection site. Patient states the bandage from previous injection was removed 08/10/22. He is unsure of exactly when the pain/redness began as he cannot see the site. Vital signs consistent with previous, BP slightly elevated, pt states that he is being followed by cardiology, Patient also afebrile. Leonides Schanz, MD notified. Jae Dire, PA at chairside to assess patient. Recommended keeping site clean, dry, and antibiotic ointment application. No hydrogen peroxide or drying solutions to site. Cleared to receive Velcade injection today in opposite side of abdomen. Gauze and paper tape applied to site after injection instead of band-aid. No adverse symptoms at time of discharge. Ambulated with Rolator and family to lobby.

## 2022-08-12 ENCOUNTER — Encounter: Payer: Self-pay | Admitting: Hematology and Oncology

## 2022-08-12 ENCOUNTER — Other Ambulatory Visit (HOSPITAL_COMMUNITY): Payer: Self-pay

## 2022-08-13 ENCOUNTER — Ambulatory Visit: Payer: Medicare Other | Admitting: Neurology

## 2022-08-14 NOTE — Telephone Encounter (Signed)
Called in prescription for Metolazone 2.5 mg    Take 30 min before 80 mg lasix  Take 3 KCL on days he does this    Take metolazone  with extra K every other day for 3 doses   Mychart response  Will need BMET done end of next week   Patient contacted

## 2022-08-15 ENCOUNTER — Other Ambulatory Visit: Payer: Self-pay | Admitting: Physician Assistant

## 2022-08-15 DIAGNOSIS — C9000 Multiple myeloma not having achieved remission: Secondary | ICD-10-CM

## 2022-08-16 ENCOUNTER — Ambulatory Visit (HOSPITAL_COMMUNITY): Payer: Medicare Other | Admitting: Physician Assistant

## 2022-08-16 ENCOUNTER — Telehealth: Payer: Self-pay | Admitting: Hematology and Oncology

## 2022-08-16 DIAGNOSIS — Z7901 Long term (current) use of anticoagulants: Secondary | ICD-10-CM | POA: Diagnosis not present

## 2022-08-16 DIAGNOSIS — I5033 Acute on chronic diastolic (congestive) heart failure: Secondary | ICD-10-CM | POA: Diagnosis not present

## 2022-08-16 DIAGNOSIS — C9000 Multiple myeloma not having achieved remission: Secondary | ICD-10-CM | POA: Diagnosis not present

## 2022-08-16 DIAGNOSIS — I110 Hypertensive heart disease with heart failure: Secondary | ICD-10-CM | POA: Diagnosis not present

## 2022-08-16 DIAGNOSIS — Z981 Arthrodesis status: Secondary | ICD-10-CM | POA: Diagnosis not present

## 2022-08-16 DIAGNOSIS — G4733 Obstructive sleep apnea (adult) (pediatric): Secondary | ICD-10-CM | POA: Diagnosis not present

## 2022-08-16 DIAGNOSIS — Z4789 Encounter for other orthopedic aftercare: Secondary | ICD-10-CM | POA: Diagnosis not present

## 2022-08-16 DIAGNOSIS — I82402 Acute embolism and thrombosis of unspecified deep veins of left lower extremity: Secondary | ICD-10-CM | POA: Diagnosis not present

## 2022-08-16 DIAGNOSIS — G629 Polyneuropathy, unspecified: Secondary | ICD-10-CM | POA: Diagnosis not present

## 2022-08-16 DIAGNOSIS — I480 Paroxysmal atrial fibrillation: Secondary | ICD-10-CM | POA: Diagnosis not present

## 2022-08-16 NOTE — Telephone Encounter (Signed)
Reached out to patient to schedule per WQ left voicemail. 

## 2022-08-17 ENCOUNTER — Telehealth: Payer: Self-pay | Admitting: Internal Medicine

## 2022-08-17 NOTE — Telephone Encounter (Signed)
Spoke to patient   1  He has not received metolazone yet  2  BP is very high 150s to 180   I recomm he increase hydralazine to 50/25/50 mg     Keep track of BP

## 2022-08-18 ENCOUNTER — Inpatient Hospital Stay (HOSPITAL_BASED_OUTPATIENT_CLINIC_OR_DEPARTMENT_OTHER): Payer: Medicare Other | Admitting: Physician Assistant

## 2022-08-18 ENCOUNTER — Other Ambulatory Visit: Payer: Self-pay

## 2022-08-18 ENCOUNTER — Inpatient Hospital Stay: Payer: Medicare Other

## 2022-08-18 ENCOUNTER — Ambulatory Visit (HOSPITAL_BASED_OUTPATIENT_CLINIC_OR_DEPARTMENT_OTHER)
Admission: RE | Admit: 2022-08-18 | Discharge: 2022-08-18 | Disposition: A | Discharge status: Home / Self Care | Payer: Medicare Other | Source: Ambulatory Visit | Attending: Physician Assistant | Admitting: Physician Assistant

## 2022-08-18 VITALS — BP 152/65 | HR 54 | Temp 97.9°F | Resp 17

## 2022-08-18 VITALS — BP 152/65 | HR 60 | Temp 97.7°F | Resp 16 | Wt 248.9 lb

## 2022-08-18 DIAGNOSIS — C9000 Multiple myeloma not having achieved remission: Secondary | ICD-10-CM | POA: Insufficient documentation

## 2022-08-18 DIAGNOSIS — I10 Essential (primary) hypertension: Secondary | ICD-10-CM | POA: Diagnosis not present

## 2022-08-18 DIAGNOSIS — R200 Anesthesia of skin: Secondary | ICD-10-CM | POA: Diagnosis not present

## 2022-08-18 DIAGNOSIS — Z5112 Encounter for antineoplastic immunotherapy: Secondary | ICD-10-CM | POA: Diagnosis not present

## 2022-08-18 DIAGNOSIS — Z8249 Family history of ischemic heart disease and other diseases of the circulatory system: Secondary | ICD-10-CM | POA: Diagnosis not present

## 2022-08-18 DIAGNOSIS — M7989 Other specified soft tissue disorders: Secondary | ICD-10-CM | POA: Diagnosis not present

## 2022-08-18 DIAGNOSIS — Z823 Family history of stroke: Secondary | ICD-10-CM | POA: Diagnosis not present

## 2022-08-18 DIAGNOSIS — Z515 Encounter for palliative care: Secondary | ICD-10-CM | POA: Diagnosis not present

## 2022-08-18 DIAGNOSIS — I480 Paroxysmal atrial fibrillation: Secondary | ICD-10-CM | POA: Diagnosis not present

## 2022-08-18 DIAGNOSIS — M533 Sacrococcygeal disorders, not elsewhere classified: Secondary | ICD-10-CM | POA: Diagnosis not present

## 2022-08-18 DIAGNOSIS — R600 Localized edema: Secondary | ICD-10-CM

## 2022-08-18 DIAGNOSIS — Z7961 Long term (current) use of immunomodulator: Secondary | ICD-10-CM | POA: Diagnosis not present

## 2022-08-18 DIAGNOSIS — R21 Rash and other nonspecific skin eruption: Secondary | ICD-10-CM | POA: Diagnosis not present

## 2022-08-18 DIAGNOSIS — M5126 Other intervertebral disc displacement, lumbar region: Secondary | ICD-10-CM | POA: Diagnosis not present

## 2022-08-18 DIAGNOSIS — R1310 Dysphagia, unspecified: Secondary | ICD-10-CM | POA: Diagnosis not present

## 2022-08-18 DIAGNOSIS — Z881 Allergy status to other antibiotic agents status: Secondary | ICD-10-CM | POA: Diagnosis not present

## 2022-08-18 DIAGNOSIS — Z923 Personal history of irradiation: Secondary | ICD-10-CM | POA: Diagnosis not present

## 2022-08-18 DIAGNOSIS — K5900 Constipation, unspecified: Secondary | ICD-10-CM | POA: Diagnosis not present

## 2022-08-18 DIAGNOSIS — Z86718 Personal history of other venous thrombosis and embolism: Secondary | ICD-10-CM | POA: Diagnosis not present

## 2022-08-18 DIAGNOSIS — J90 Pleural effusion, not elsewhere classified: Secondary | ICD-10-CM | POA: Diagnosis not present

## 2022-08-18 DIAGNOSIS — Z7901 Long term (current) use of anticoagulants: Secondary | ICD-10-CM | POA: Diagnosis not present

## 2022-08-18 DIAGNOSIS — Z79899 Other long term (current) drug therapy: Secondary | ICD-10-CM | POA: Diagnosis not present

## 2022-08-18 DIAGNOSIS — E876 Hypokalemia: Secondary | ICD-10-CM | POA: Diagnosis not present

## 2022-08-18 LAB — CBC WITH DIFFERENTIAL (CANCER CENTER ONLY)
Abs Immature Granulocytes: 0.05 10*3/uL (ref 0.00–0.07)
Basophils Absolute: 0.1 10*3/uL (ref 0.0–0.1)
Basophils Relative: 1 %
Eosinophils Absolute: 0.1 10*3/uL (ref 0.0–0.5)
Eosinophils Relative: 2 %
HCT: 33.6 % — ABNORMAL LOW (ref 39.0–52.0)
Hemoglobin: 10.8 g/dL — ABNORMAL LOW (ref 13.0–17.0)
Immature Granulocytes: 1 %
Lymphocytes Relative: 6 %
Lymphs Abs: 0.4 10*3/uL — ABNORMAL LOW (ref 0.7–4.0)
MCH: 32.3 pg (ref 26.0–34.0)
MCHC: 32.1 g/dL (ref 30.0–36.0)
MCV: 100.6 fL — ABNORMAL HIGH (ref 80.0–100.0)
Monocytes Absolute: 0.4 10*3/uL (ref 0.1–1.0)
Monocytes Relative: 6 %
Neutro Abs: 6.3 10*3/uL (ref 1.7–7.7)
Neutrophils Relative %: 84 %
Platelet Count: 194 10*3/uL (ref 150–400)
RBC: 3.34 MIL/uL — ABNORMAL LOW (ref 4.22–5.81)
RDW: 17.2 % — ABNORMAL HIGH (ref 11.5–15.5)
Smear Review: NORMAL
WBC Count: 7.4 10*3/uL (ref 4.0–10.5)
nRBC: 0.3 % — ABNORMAL HIGH (ref 0.0–0.2)

## 2022-08-18 LAB — CMP (CANCER CENTER ONLY)
ALT: 23 U/L (ref 0–44)
AST: 12 U/L — ABNORMAL LOW (ref 15–41)
Albumin: 3.8 g/dL (ref 3.5–5.0)
Alkaline Phosphatase: 80 U/L (ref 38–126)
Anion gap: 7 (ref 5–15)
BUN: 16 mg/dL (ref 8–23)
CO2: 29 mmol/L (ref 22–32)
Calcium: 8.7 mg/dL — ABNORMAL LOW (ref 8.9–10.3)
Chloride: 106 mmol/L (ref 98–111)
Creatinine: 0.79 mg/dL (ref 0.61–1.24)
GFR, Estimated: >60 mL/min (ref >60)
Glucose, Bld: 135 mg/dL — ABNORMAL HIGH (ref 70–99)
Potassium: 3.5 mmol/L (ref 3.5–5.1)
Sodium: 142 mmol/L (ref 135–145)
Total Bilirubin: 0.4 mg/dL (ref 0.3–1.2)
Total Protein: 5.9 g/dL — ABNORMAL LOW (ref 6.5–8.1)

## 2022-08-18 LAB — FOLATE: Folate: 24.4 ng/mL (ref >5.9)

## 2022-08-18 MED ORDER — BORTEZOMIB CHEMO SQ INJECTION 3.5 MG (2.5MG/ML)
1.3000 mg/m2 | Freq: Once | INTRAMUSCULAR | Status: AC
  Administered 2022-08-18: 3 mg via SUBCUTANEOUS
  Filled 2022-08-18: qty 1.2

## 2022-08-18 MED ORDER — ZOLEDRONIC ACID 4 MG/100ML IV SOLN
4.0000 mg | Freq: Once | INTRAVENOUS | Status: AC
  Administered 2022-08-18: 4 mg via INTRAVENOUS
  Filled 2022-08-18: qty 100

## 2022-08-18 MED ORDER — SODIUM CHLORIDE 0.9 % IV SOLN: Freq: Once | INTRAVENOUS | Status: AC

## 2022-08-18 MED ORDER — POTASSIUM CHLORIDE CRYS ER 20 MEQ PO TBCR: 40.0000 meq | EXTENDED_RELEASE_TABLET | Freq: Every day | ORAL | 3 refills | Status: DC

## 2022-08-18 NOTE — Progress Notes (Signed)
Bilateral lower extremity venous study completed.   Preliminary results left as a message at Georga Kaufmann, [place] office.  Please see CV Procedures for preliminary results.  Tennile Styles, RVT  3:15 PM 08/18/22

## 2022-08-18 NOTE — Patient Instructions (Signed)
[facility]  Discharge Instructions: Thank you for choosing [facility] to provide your oncology and hematology care.   If you have a lab appointment with the Cancer Center, please go directly to the Cancer Center and check in at the registration area.   Wear comfortable clothing and clothing appropriate for easy access to any Portacath or PICC line.   We strive to give you quality time with your provider. You may need to reschedule your appointment if you arrive late (15 or more minutes).  Arriving late affects you and other patients whose appointments are after yours.  Also, if you miss three or more appointments without notifying the office, you may be dismissed from the clinic at the provider's discretion.      For prescription refill requests, have your pharmacy contact our office and allow 72 hours for refills to be completed.    Today you received the following chemotherapy and/or immunotherapy agents: bortezomib      To help prevent nausea and vomiting after your treatment, we encourage you to take your nausea medication as directed.  BELOW ARE SYMPTOMS THAT SHOULD BE REPORTED IMMEDIATELY: *FEVER GREATER THAN 100.4 F (38 C) OR HIGHER *CHILLS OR SWEATING *NAUSEA AND VOMITING THAT IS NOT CONTROLLED WITH YOUR NAUSEA MEDICATION *UNUSUAL SHORTNESS OF BREATH *UNUSUAL BRUISING OR BLEEDING *URINARY PROBLEMS (pain or burning when urinating, or frequent urination) *BOWEL PROBLEMS (unusual diarrhea, constipation, pain near the anus) TENDERNESS IN MOUTH AND THROAT WITH OR WITHOUT PRESENCE OF ULCERS (sore throat, sores in mouth, or a toothache) UNUSUAL RASH, SWELLING OR PAIN  UNUSUAL VAGINAL DISCHARGE OR ITCHING   Items with * indicate a potential emergency and should be followed up as soon as possible or go to the Emergency Department if any problems should occur.  Please show the CHEMOTHERAPY ALERT CARD or IMMUNOTHERAPY ALERT CARD at  check-in to the Emergency Department and triage nurse.  Should you have questions after your visit or need to cancel or reschedule your appointment, please contact [facility]  Dept: [phone]  and follow the prompts.  Office hours are 8:00 a.m. to 4:30 p.m. Monday - Friday. Please note that voicemails left after 4:00 p.m. may not be returned until the following business day.  We are closed weekends and major holidays. You have access to a nurse at all times for urgent questions. Please call the main number to the clinic Dept: [phone] and follow the prompts.   For any non-urgent questions, you may also contact your provider using MyChart. We now offer e-Visits for anyone 18 and older to request care online for non-urgent symptoms. For details visit [email or web address].   Also download the MyChart app! Go to the app store, search "MyChart", open the app, select [facility], and log in with your MyChart username and password.   

## 2022-08-18 NOTE — Telephone Encounter (Signed)
Called to check and see how the pt is doing... he notes that he was able to get the Metolazone but has not taken since he had chemo today and did not want to risk the need to urinate any more than he does... he took Lasix 80 mg this morning and is noticing the edema is improving some.   He will start the Metolazone tomorrow.   He had new labs with Oncology today... BP 152/65.   I will call this Friday when we are in clinic to check on him and see how he is doing.

## 2022-08-19 ENCOUNTER — Telehealth: Payer: Self-pay

## 2022-08-19 NOTE — Telephone Encounter (Signed)
-----   Message from Briant Cedar, PA-C sent at 08/18/2022 11:46 PM EDT ----- Please notify patient that there is no evidence of DVT

## 2022-08-19 NOTE — Telephone Encounter (Signed)
LM for pt with doppler results and advised to stop folic acid.

## 2022-08-20 NOTE — Telephone Encounter (Addendum)
Called to check and see how the pt is doing...   He says he took his first dose of Metolazone yesterday. He did not feel that he urinated that much more than usual but his weight since yesterday is down 3 pounds.  He says he is breathing well and he was able to walk around [employer] yesterday afternoon without any difficulty.   His weight in summary:   4/8 his baseline 232# 4/10 236 4/14 238 4/18 247 and started metolazone pre-lasix 80 mg  4/19 244     He says his legs are still tight but improving.   BP today better at 145/64.   He has been having weekly labs with Oncology.[name] last labs 08/18/22 but no BNP.   I will forward to Dr [name] for her review and I will call the pt again on Monday 08/23/22 to see how he is doing.

## 2022-08-22 ENCOUNTER — Encounter: Payer: Self-pay | Admitting: Hematology and Oncology

## 2022-08-22 NOTE — Progress Notes (Signed)
[facility] [phone]) [phone]   [phone]) [phone]  PROGRESS NOTE  Patient Care Team: [name], MD as PCP - General (Internal Medicine) Pricilla Riffle, MD as PCP - Cardiology (Cardiology) Quintella Reichert, MD as PCP - Sleep Medicine (Cardiology) Lanier Prude, MD as PCP - Electrophysiology (Cardiology) [name], MD as Consulting Physician (General Surgery) Vida Rigger, MD as Consulting Physician (Gastroenterology) Quintella Reichert, MD as Consulting Physician (Sleep Medicine) Glendale Chard, DO as Consulting Physician (Neurology)  Hematological/Oncological History # IgA Kappa Multiple Myeloma 05/05/2020: Bone marrow biopsy showed increased number of plasma cells representing 14% of all cells in the aspirate associated with numerous variably sized clusters in the clot and biopsy sections 04/10/2021: M protein 0.3, IgA kappa specificity. Kappa 580.5, Labda 7.0, ratio 82.93.   12/07/2021: Labs show of 1971.3, lambda 5.5, ratio 358.42 12/10/2021: Transition care to Dr. Leonides Schanz. 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  01/26/2022: Cycle 2 Day 1 of Dara/Rev/Dex 02/22/2022: Cycle 3 Day 1 of Dara/Rev/Dex 03/23/2022: Cycle 4 Day 1 of Dara/Rev/Dex 04/20/2022: Cycle 5 Day 1 of Dara/Rev/Dex 05/18/2022: Cycle 6 Day 1 of Dara/Rev/Dex 05/27/2022: MRI cervical/thoracic/lumbar: pathologic fracture at T2 with significant osseous retropulsion and resulting cord compression. Additional smaller lesions within T1 vertebral body, right C7 and T3 articulating facets.  Small Multiple Myeloma lesions scattered at all lumbar levels and in the visible left sacral ala. Largest lumbar tumor is 2.3 cm in the left L1 posterior elements and pedicle. No lumbar extraosseous or epidural tumor extension, or pathologic fracture.Underlying chronic lumbar spine  degeneration, with a new central disc herniation at L3-L4 since 2021 now resulting in mild to moderate degenerative spinal stenosis there. 05/28/2022-06/09/2022: Admitted for T2 pathologic fracture and cord compression. He received urgent radiation therapy for a total of 3.0 Gy. He underwent  thoracic 1 through 4 posterior lateral arthrodesis/fusion and 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  06/23/2022: Cycle 1 Day 1 of Velcade/Pomalyst/Dex 08/04/2022: Cycle 2 Day 1 of Velcade/Pomalyst/Dex. Pomalyst to be decreased to 2mg  PO daily due to cytopenias.   Interval History:  Travis [name] 72 y.o. male with medical history significant for IgA Kappa Multiple Myeloma who presents for a follow up visit. He was last seen on 08/04/2022. In the interim, he continues on Velcade/Pomalyst/Dex therapy  On exam today Travis [name] reports that he is still having lower extremity edema, left greater than right. He was recently prescribed another diuretic which he has yet to start. He reports his energy levels are overall stable. He is starting to ambulate without assistance. His mobility of his neck is slowly improving. He denies nausea, vomiting or abdominal pain. His bowel habits are unchanged with occasional constipation.He denies easy bruising or signs of active bleeding. He denies any new bone or back pain. He reports a rash on his upper extremities and scalp that is He denies fevers, chills, sweats, shortness of breath, chest pain or cough. He has no other complaints. Rest of the 10 point ROS is below.    Overall he is willing and able to proceed with treatment today.  MEDICAL HISTORY:  Past Medical History:  Diagnosis Date   A-fib    Arthritis    "comes and goes; mostly in hands, occasionally elbow" (04/05/2017)   Complication of anesthesia    "just wanted to sleep alot  for couple days S/P VARICOCELE OR" (  04/05/2017)   DVT (deep venous thrombosis)    LLE   Dysrhythmia    PVCs    GERD  (gastroesophageal reflux disease)    History of hiatal hernia    Hypertension    Impaired glucose tolerance    Multiple myeloma    Obesity (BMI 30-39.9) 07/26/2016   OSA on CPAP 07/26/2016   Mild with AHI 12.5/hr now on CPAP at 14cm H2O   PAF (paroxysmal atrial fibrillation)    s/p PVI Ablation in 2018 // Flecainide Rx // Echo 8/21: EF 60-65, no RWMA, mild LVH, normal RV SF, moderate RVE, mild BAE, trivial MR, mild dilation of aortic root (40 mm)   Pneumonia    "may have had walking pneumonia a few years ago" (04/05/2017)   Pre-diabetes    Thrombophlebitis     SURGICAL HISTORY: Past Surgical History:  Procedure Laterality Date   ATRIAL FIBRILLATION ABLATION  04/05/2017   ATRIAL FIBRILLATION ABLATION N/A 04/05/2017   Procedure: ATRIAL FIBRILLATION ABLATION;  Surgeon: [name], MD;  Location: [facility];  Service: Cardiovascular;  Laterality: N/A;   BONE BIOPSY Right 12/17/2021   Procedure: BONE BIOPSY;  Surgeon: [name], MD;  Location: [facility];  Service: Orthopedics;  Laterality: Right;   COMPLETE RIGHT HIP REPLACMENT  01/19/2019   EVALUATION UNDER ANESTHESIA WITH HEMORRHOIDECTOMY N/A 02/24/2017   Procedure: EXAM UNDER ANESTHESIA WITH  HEMORRHOIDECTOMY;  Surgeon: [name], MD;  Location: [facility];  Service: General;  Laterality: N/A;   EXCISIONAL TOTAL HIP ARTHROPLASTY WITH ANTIBIOTIC SPACERS Right 09/25/2020   Procedure: EXCISIONAL TOTAL HIP ARTHROPLASTY WITH ANTIBIOTIC SPACERS;  Surgeon: [name], MD;  Location: [facility];  Service: Orthopedics;  Laterality: Right;   FLEXIBLE SIGMOIDOSCOPY N/A 04/15/2015   Procedure:  UNSEDATED FLEXIBLE SIGMOIDOSCOPY;  Surgeon: [name], MD;  Location: [facility];  Service: Endoscopy;  Laterality: N/A;   HUMERUS IM NAIL Right 12/17/2021   Procedure: INTRAMEDULLARY (IM) NAIL HUMERAL;  Surgeon: [name], MD;  Location: [facility];  Service: Orthopedics;  Laterality: Right;   KNEE  ARTHROSCOPY Left ~ 2016   POSTERIOR CERVICAL FUSION/FORAMINOTOMY N/A 06/04/2022   Procedure: Thoracic one - Thoracic Four Posterior lateral arthrodesis/ fusion and Thoracic two Corpectomy;  Surgeon: [name], MD;  Location: [facility];  Service: Neurosurgery;  Laterality: N/A;   REIMPLANTATION OF TOTAL HIP Right 12/25/2020   Procedure: REIMPLANTATION/REVISION OF RIGHT TOTAL HIP VERSUS REPEAT IRRIGATION AND DEBRIDEMENT;  Surgeon: [name], MD;  Location: [facility];  Service: Orthopedics;  Laterality: Right;   TESTICLE SURGERY  1988   VARICOCELE   TONSILLECTOMY     VARICOSE VEIN SURGERY Bilateral     SOCIAL HISTORY: Social History   Socioeconomic History   Marital status: Married    Spouse name: Not on file   Number of children: 1   Years of education: law school   Highest education level: Not on file  Occupational History   Occupation: lawyer  Tobacco Use   Smoking status: Never   Smokeless tobacco: Never  Vaping Use   Vaping Use: Never used  Substance and Sexual Activity   Alcohol use: Yes    Alcohol/week: 2.0 standard drinks of alcohol    Types: 2 Glasses of wine per week    Comment: 2 glasses of wine a week, occ bourbon, beer   Drug use: No   Sexual activity: Not Currently  Other Topics Concern   Not on file  Social History Narrative  Lives in [place] with spouse in a 2 story home.  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 1 daughter.     Education: Social worker school.    Social Determinants of Health   Financial Resource Strain: Not on file  Food Insecurity: No Food Insecurity (06/30/2022)   Hunger Vital Sign    Worried About Running Out of Food in the Last Year: Never true    Ran Out of Food in the Last Year: Never true  Transportation Needs: No Transportation Needs (06/30/2022)   PRAPARE - Administrator, Civil Service (Medical): No    Lack of Transportation (Non-Medical): No  Physical Activity: Not on file  Stress: Not on file   Social Connections: Not on file  Intimate Partner Violence: Not At Risk (06/30/2022)   Humiliation, Afraid, Rape, and Kick questionnaire    Fear of Current or Ex-Partner: No    Emotionally Abused: No    Physically Abused: No    Sexually Abused: No    FAMILY HISTORY: Family History  Problem Relation Age of Onset   Dementia Mother    Stroke Father    Atrial fibrillation Father    Hypertension Father    Hypertension Paternal Grandfather    Dementia Maternal Grandmother     ALLERGIES:  is allergic to linezolid and vancomycin.  MEDICATIONS:  Current Outpatient Medications  Medication Sig Dispense Refill   acetaminophen (TYLENOL) 325 MG tablet Take 2 tablets (650 mg total) by mouth every 6 (six) hours as needed. 36 tablet 0   acyclovir (ZOVIRAX) 400 MG tablet Take 1 tablet (400 mg total) by mouth 2 (two) times daily. 180 tablet 3   amiodarone (PACERONE) 200 MG tablet Take 1 tablet (200 mg total) by mouth daily. Start after finishing 40 mg twice daily dose of  days 180 tablet 1   apixaban (ELIQUIS) 5 MG TABS tablet Take 1 tablet (5 mg total) by mouth 2 (two) times daily. 60 tablet 11   cholecalciferol (VITAMIN D3) 25 MCG (1000 UNIT) tablet Take 1,000 Units by mouth daily.     Cyanocobalamin (VITAMIN B-12 PO) Take 1,000 mcg by mouth daily.     dexamethasone (DECADRON) 4 MG tablet Take 40 mg by mouth See admin instructions. Take 40 mg by mouth once a day the morning of chemo on Wednesday- or unless otherwise instructed     fluticasone (FLONASE) 50 MCG/ACT nasal spray Place 1 spray into both nostrils daily as needed for allergies or rhinitis.     folic acid (FOLVITE) 1 MG tablet Take 1 mg by mouth daily.     furosemide (LASIX) 40 MG tablet Take 2 tablets (80 mg total) by mouth daily. 180 tablet 0   gabapentin (NEURONTIN) 300 MG capsule TAKE TWO CAPSULES BY MOUTH TWICE A DAY 360 capsule 1   lisinopril (ZESTRIL) 40 MG tablet TAKE ONE TABLET BY MOUTH ONE TIME DAILY 90 tablet 3   metolazone  (ZAROXOLYN) 2.5 MG tablet Take 2.5 mg by mouth every other day. 30 minutes prior to taking your morning Lasix dose only.     Omega-3 Fatty Acids (FISH OIL PO) Take 1 Capful by mouth daily.     pomalidomide (POMALYST) 2 MG capsule Take 1 capsule (2 mg total) by mouth daily. Take one tablet (2 mg by mouth) daily for 21 days, then take 7 days off 21 capsule 0   sodium chloride (OCEAN) 0.65 % SOLN nasal spray Place 1 spray into both nostrils as  needed for congestion.     hydrALAZINE (APRESOLINE) 25 MG tablet Take 1 tablet (25 mg total) by mouth every 8 (eight) hours. (Patient taking differently: Take 25 mg by mouth 3 (three) times daily. 50 mg am   25 mg noon and 50 mg PM) 90 tablet 0   lidocaine (XYLOCAINE) 2 % solution Use as directed 15 mLs in the mouth or throat 4 (four) times daily -  before meals and at bedtime. (Patient not taking: Reported on 08/18/2022) 250 mL 1   potassium chloride SA (KLOR-CON M) 20 MEQ tablet Take 2 tablets (40 mEq total) by mouth daily. Take an extra tablet on the day you take the Metolazone (60 meq total) 180 tablet 3   predniSONE (STERAPRED UNI-PAK 21 TAB) 5 MG (21) TBPK tablet Take 5 mg by mouth. (Patient not taking: Reported on 08/18/2022)     No current facility-administered medications for this visit.    REVIEW OF SYSTEMS:   Constitutional: ( - ) fevers, ( - )  chills , ( - ) night sweats Eyes: ( - ) blurriness of vision, ( - ) double vision, ( - ) watery eyes Ears, nose, mouth, throat, and face: ( - ) mucositis, ( - ) sore throat Respiratory: ( - ) cough, ( - ) dyspnea, ( - ) wheezes Cardiovascular: ( - ) palpitation, ( - ) chest discomfort, ( - ) lower extremity swelling Gastrointestinal:  ( - ) nausea, ( - ) heartburn, ( - ) change in bowel habits Skin: ( - ) abnormal skin rashes Lymphatics: ( - ) new lymphadenopathy, ( - ) easy bruising Neurological: ( - ) numbness, ( - ) tingling, ( - ) new weaknesses Behavioral/Psych: ( - ) mood change, ( - ) new changes   All other systems were reviewed with the patient and are negative.  PHYSICAL EXAMINATION: ECOG PERFORMANCE STATUS: 2 - Symptomatic, <50% confined to bed  Vitals:   08/18/22 1055  BP: (!) 152/65  Pulse: 60  Resp: 16  Temp: 97.7 F (36.5 C)  SpO2: 96%     Filed Weights   08/18/22 1055  Weight: 248 lb 14.4 oz (112.9 kg)      GENERAL: Well-appearing elderly Caucasian male, alert, no distress and comfortable SKIN: no overt abnormalities of the skin.  EYES: conjunctiva are pink and non-injected, sclera clear LUNGS: clear to auscultation and percussion with normal breathing effort HEART: tachycardic &  no murmurs. +bilateral lower extremity edema, left greater than right Musculoskeletal: no cyanosis of digits and no clubbing  PSYCH: alert & oriented x 3, fluent speech NEURO: no focal motor/sensory deficits  LABORATORY DATA:  I have reviewed the data as listed    Latest Ref Rng & Units 08/18/2022   10:35 AM 08/11/2022    8:42 AM 08/04/2022   11:04 AM  CBC  WBC 4.0 - 10.5 K/uL 7.4  6.4  2.5   Hemoglobin 13.0 - 17.0 g/dL 16.1  09.6  04.5   Hematocrit 39.0 - 52.0 % 33.6  34.3  33.6   Platelets 150 - 400 K/uL 194  194  107        Latest Ref Rng & Units 08/18/2022   10:35 AM 08/11/2022    8:42 AM 08/04/2022   11:04 AM  CMP  Glucose 70 - 99 mg/dL 409  88  811   BUN 8 - 23 mg/dL Creatinine 0.61 - 1.24 mg/dL 9.14  7.82  9.56  Sodium 135 - 145 mmol/L 142  142  142   Potassium 3.5 - 5.1 mmol/L 3.5  3.4  3.9   Chloride 98 - 111 mmol/L 106  105  110   CO2 22 - 32 mmol/L Calcium 8.9 - 10.3 mg/dL 8.7  8.6  8.2   Total Protein 6.5 - 8.1 g/dL 5.9  5.5  5.2   Total Bilirubin 0.3 - 1.2 mg/dL 0.4  0.3  0.4   Alkaline Phos 38 - 126 U/L 80  77  60   AST 15 - 41 U/L ALT 0 - 44 U/L Lab Results  Component Value Date   MPROTEIN Not Observed 08/04/2022   MPROTEIN Not Observed 07/28/2022   MPROTEIN Not Observed 06/23/2022   Lab  Results  Component Value Date   KPAFRELGTCHN 16.1 08/04/2022   KPAFRELGTCHN 34.9 (H) 07/28/2022   KPAFRELGTCHN 138.1 (H) 06/23/2022   LAMBDASER 3.8 (L) 08/04/2022   LAMBDASER 4.7 (L) 07/28/2022   LAMBDASER 3.4 (L) 06/23/2022   KAPLAMBRATIO 4.24 (H) 08/04/2022   KAPLAMBRATIO 7.43 (H) 07/28/2022   KAPLAMBRATIO 40.62 (H) 06/23/2022     RADIOGRAPHIC STUDIES: VAS [place] LOWER EXTREMITY VENOUS (DVT)  Result Date: 08/18/2022  Lower Venous DVT Study Patient Name:  ZAYVEN POWE [facility]  Date of Exam:   08/18/2022 Medical Rec #: [id number]         Accession #:    [id number] Date of Birth: 1950/07/19        Patient Gender: M Patient Age:   50 years Exam Location:  [facility] Procedure:      VAS [place] LOWER EXTREMITY VENOUS (DVT) Referring Phys: Georga Kaufmann --------------------------------------------------------------------------------  Indications: Asymmetrical swelling LT>RT, s/p thoracic spine, 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  Anticoagulation: Eliquis. Limitations: Poor ultrasound/tissue interface. Comparison Study: Previous 06/21/22 positive in LLE. Performing Technologist: McKayla Maag RVT, [place]  Examination Guidelines: A complete evaluation includes B-mode imaging, spectral Doppler, color Doppler, and power Doppler as needed of all accessible portions of each vessel. Bilateral testing is considered an integral part of a complete examination. Limited examinations for reoccurring indications may be performed as noted. The reflux portion of the exam is performed with the patient in reverse Trendelenburg.  +---------+---------------+---------+-----------+----------+--------------+ RIGHT    CompressibilityPhasicitySpontaneityPropertiesThrombus Aging +---------+---------------+---------+-----------+----------+--------------+ CFV      Full           Yes      Yes                                 +---------+---------------+---------+-----------+----------+--------------+ SFJ      Full                                                         +---------+---------------+---------+-----------+----------+--------------+ FV Prox  Full                                                        +---------+---------------+---------+-----------+----------+--------------+ FV Mid   Full                                                        +---------+---------------+---------+-----------+----------+--------------+  FV DistalFull                                                        +---------+---------------+---------+-----------+----------+--------------+ PFV      Full                                                        +---------+---------------+---------+-----------+----------+--------------+ POP      Full           Yes      Yes                                 +---------+---------------+---------+-----------+----------+--------------+ PTV      Full                                                        +---------+---------------+---------+-----------+----------+--------------+ PERO     Full                                                        +---------+---------------+---------+-----------+----------+--------------+   +---------+---------------+---------+-----------+----------+--------------+ LEFT     CompressibilityPhasicitySpontaneityPropertiesThrombus Aging +---------+---------------+---------+-----------+----------+--------------+ CFV      Full           Yes      Yes                                 +---------+---------------+---------+-----------+----------+--------------+ SFJ      Full                                                        +---------+---------------+---------+-----------+----------+--------------+ FV Prox  Full                                                        +---------+---------------+---------+-----------+----------+--------------+ FV Mid   Full                                                         +---------+---------------+---------+-----------+----------+--------------+ FV DistalFull                                                        +---------+---------------+---------+-----------+----------+--------------+  PFV      Full                                                        +---------+---------------+---------+-----------+----------+--------------+ POP      Full           Yes      Yes                                 +---------+---------------+---------+-----------+----------+--------------+ PTV      Full                                                        +---------+---------------+---------+-----------+----------+--------------+ PERO     Full                                                        +---------+---------------+---------+-----------+----------+--------------+     Summary: BILATERAL: - No evidence of deep vein thrombosis seen in the lower extremities, bilaterally. - No evidence of superficial venous thrombosis in the lower extremities, bilaterally. -No evidence of popliteal cyst, bilaterally.   *See table(s) above for measurements and observations. Electronically signed by Gerarda Fraction on 08/18/2022 at 10:33:02 PM.    Final    CT Soft Tissue Neck W Contrast  Result Date: 08/02/2022 CLINICAL DATA:  Left ear pain, history of multiple myeloma, recently finished radiation to upper thoracic spine for cancer EXAM: CT NECK WITH CONTRAST TECHNIQUE: Multidetector CT imaging of the neck was performed using the standard protocol following the bolus administration of intravenous contrast. RADIATION DOSE REDUCTION: This exam was performed according to the departmental dose-optimization program which includes automated exposure control, adjustment of the mA and/or kV according to patient size and/or use of iterative reconstruction technique. CONTRAST:  75mL OMNIPAQUE IOHEXOL 300 MG/ML  SOLN COMPARISON:  No prior neck CT, correlation is made with 06/29/2022 CTA  chest FINDINGS: Pharynx and larynx: No acute finding in the nasopharynx or oropharynx. Edema along the posterior aspect of the hypopharynx/proximal larynx (series 5, image 87), with some thickening of the proximal esophagus. No mass is seen. Salivary glands: No inflammation, mass, or stone. Thyroid: Normal. Lymph nodes: None enlarged or abnormal density. Vascular: Patent. Limited intracranial: Negative. Visualized orbits: Negative. Mastoids and visualized paranasal sinuses: Minimal mucosal thickening in the left maxillary sinus. Fluid in left mastoid air cells. Otherwise clear. Skeleton: Status post T1-T4 posterior fusion with T2 corpectomy spacer. No acute fracture in the imaged cervical or thoracic spine, although evaluation is somewhat limited by beam hardening artifact from the patient's hardware. Fluid collection in the soft tissues posterior to the spine and in the paraspinous musculature, which surrounds the spinous processes of C7 and T1. This collection measures up to 3.9 x 6.3 x 6.4 cm (AP x TR x CC) (series 5, image 92 and series 7, image 67). This is adjacent to the postsurgical changes in the subcutaneous tissues and may be postsurgical; this is likely  present and larger on the 06/29/2022 CTA chest, where it is obscured by beam hardening artifact. Upper chest: Debris in the esophagus, which increases the risk of aspiration. Moderate right pleural effusion with associated atelectasis. Other: None. IMPRESSION: 1. Edema along the posterior aspect of the hypopharynx, proximal larynx, and proximal esophagus, favored to represent the sequela of recent radiation therapy. No mass or abscess seen. 2. Fluid collection in the soft tissues posterior to the spine and in the paraspinous musculature, favored to be related to the patient's recent spine surgery and likely present on the prior CTA chest. While this may represent a postoperative seroma, an abscess cannot be excluded. 3. Moderate right pleural effusion  with associated atelectasis. 4. Debris in the esophagus, which increases the risk of aspiration. Electronically Signed   By: Wiliam Ke M.D.   On: 08/02/2022 21:53    ASSESSMENT & PLAN TYRIC RODEHEAVER is a 72 y.o. male with medical history significant for IgA Kappa Multiple Myeloma who presents for a follow up visit.   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  He meets diagnostic criteria by having a serum free light chain ratio greater than 100 and lytic lesions causing pathological fracture of the humerus.  Given this I would recommend we pursued Darzalex, Revlimid, and dexamethasone.  Given his advanced age I would not recommend transplantation, that this is something we can consider when it comes time to transition to maintenance therapy versus transplant.  The patient voices understanding of the plan moving forward.  # IgA Kappa Multiple Myeloma --Metastatic survey completed 12/15/2021 --Patient underwent fixation of his humerus on 12/17/2021, pathology confirmed plasmacytoma/plasma cell neoplasm.  --Received 6 cycles of Darzalex, Revlimid, and dexamethasone started on 12/28/2021-05/18/2022.  --Due to worsening myeloma osseous lesions including pathologic fracture at T2 with cord compression, Dr. Leonides Schanz recommends changing therapy to Velcade/Pomalyst/Dex.  Plan: --Due for Cycle 2, Day 15 of Velcade/Pom/Dex today --labs from today showed white blood cell 7.4, hemoglobin 10.8, MCV 100.6 and platelets of 194 --restaging labs from 08/04/2022 with SPEP and SFLC showed M protein not detectable. Kappa decreased to 16.1 (from 34.9), Lambda 3.8, Ratio 4.24 --Pomalyst was dose reduced to 2 mg p.o. moving due to cytopenias. Recommend to continue --RTC in 2 weeks with interval weekly treatments  #Palliative Radiation Therapy: # Dysphagia --Received palliative radiation from 01/06/2022-01/19/2022 to right  hymerus, right forearm and right chest/rib, 20 Gy in 10 Fx.  --recieved palliative radiation to surgical bed in the upper thoracic spine area on 07/13/2022.  27 Gy in 9 Fx.   # Rash Concerning for Shingles- improving -- Prescribed valacyclovir 3 times daily 1000 mg x 10 days-completed the course -- We will continue to monitor rash closely.  #DVT: --Found to have acute DVT involving left peroneal veins. Likely secondary to recent surgery and hospitalization --Currently on eliquis 5 mg BID.  --Due to worsening lower extremity edema, we will repeat doppler US to rule out repeat DVT.   #Hypokalemia: --Currently on potassium chloride 40 mEq twice daily as prescribed. Potassium level is normal today.   #Rash: --Okay to try topical hydrocortisone cream for spot treatment  #Supportive Care -- chemotherapy education complete -- port placement not required -- zofran 8mg  q8H PRN and compazine 10mg  PO q6H for nausea -- acyclovir 400mg  PO BID for VCZ prophylaxis -- Started Zometa q 12 weeks on 05/18/2022. Next dose April 2024.    No orders  of the defined types were placed in this encounter.   All questions were answered. The patient knows to call the clinic with any problems, questions or concerns.  A total of more than 30 minutes were spent on this encounter with face-to-face time and non-face-to-face time, including preparing to see the patient, ordering tests and/or medications, counseling the patient and coordination of care as outlined above.   Georga Kaufmann PA-C Dept of Hematology and Oncology [facility] Phone: [phone]     08/22/2022 9:53 PM

## 2022-08-23 ENCOUNTER — Other Ambulatory Visit: Payer: Self-pay | Admitting: Hematology and Oncology

## 2022-08-23 ENCOUNTER — Telehealth: Payer: Self-pay | Admitting: Hematology and Oncology

## 2022-08-23 ENCOUNTER — Other Ambulatory Visit: Payer: Self-pay | Admitting: Physician Assistant

## 2022-08-23 DIAGNOSIS — Z7901 Long term (current) use of anticoagulants: Secondary | ICD-10-CM | POA: Diagnosis not present

## 2022-08-23 DIAGNOSIS — C9000 Multiple myeloma not having achieved remission: Secondary | ICD-10-CM

## 2022-08-23 DIAGNOSIS — I5033 Acute on chronic diastolic (congestive) heart failure: Secondary | ICD-10-CM | POA: Diagnosis not present

## 2022-08-23 DIAGNOSIS — I82402 Acute embolism and thrombosis of unspecified deep veins of left lower extremity: Secondary | ICD-10-CM | POA: Diagnosis not present

## 2022-08-23 DIAGNOSIS — I480 Paroxysmal atrial fibrillation: Secondary | ICD-10-CM | POA: Diagnosis not present

## 2022-08-23 DIAGNOSIS — R600 Localized edema: Secondary | ICD-10-CM

## 2022-08-23 DIAGNOSIS — Z4789 Encounter for other orthopedic aftercare: Secondary | ICD-10-CM | POA: Diagnosis not present

## 2022-08-23 DIAGNOSIS — G629 Polyneuropathy, unspecified: Secondary | ICD-10-CM | POA: Diagnosis not present

## 2022-08-23 DIAGNOSIS — I110 Hypertensive heart disease with heart failure: Secondary | ICD-10-CM | POA: Diagnosis not present

## 2022-08-23 DIAGNOSIS — Z981 Arthrodesis status: Secondary | ICD-10-CM | POA: Diagnosis not present

## 2022-08-23 DIAGNOSIS — G4733 Obstructive sleep apnea (adult) (pediatric): Secondary | ICD-10-CM | POA: Diagnosis not present

## 2022-08-23 NOTE — Telephone Encounter (Signed)
Reached out to patient to schedule per WQ left voicemail. 

## 2022-08-23 NOTE — Addendum Note (Signed)
Addended by: Bertram Millard on: 08/23/2022 01:44 PM   Modules accepted: Orders

## 2022-08-23 NOTE — Telephone Encounter (Signed)
Called to follow up with the pt and she says the swelling is much improved but not gone... he is worried about his BP it has been running 150's/ 60's every day a couple of hours after taking his morning meds.[name]   He is still having "PVC's"... he does not think it is AFIB but he was in Afib briefly late last week.   He wants to try and get everything stable since he is leaving for an [place] cruise 09/09/22.   He will have a BNP added to his Oncology labs planned for this Wed. 08/25/22.    Will forward to Dr [name] re: his BP and palpitations.

## 2022-08-25 ENCOUNTER — Other Ambulatory Visit: Payer: Self-pay

## 2022-08-25 ENCOUNTER — Inpatient Hospital Stay: Payer: Medicare Other

## 2022-08-25 ENCOUNTER — Other Ambulatory Visit: Payer: Self-pay | Admitting: *Deleted

## 2022-08-25 ENCOUNTER — Encounter: Payer: Self-pay | Admitting: Radiation Oncology

## 2022-08-25 VITALS — BP 134/58 | HR 52 | Temp 98.0°F | Resp 18 | Wt 238.8 lb

## 2022-08-25 DIAGNOSIS — Z7901 Long term (current) use of anticoagulants: Secondary | ICD-10-CM | POA: Diagnosis not present

## 2022-08-25 DIAGNOSIS — I480 Paroxysmal atrial fibrillation: Secondary | ICD-10-CM | POA: Diagnosis not present

## 2022-08-25 DIAGNOSIS — I5033 Acute on chronic diastolic (congestive) heart failure: Secondary | ICD-10-CM

## 2022-08-25 DIAGNOSIS — R1310 Dysphagia, unspecified: Secondary | ICD-10-CM | POA: Diagnosis not present

## 2022-08-25 DIAGNOSIS — M533 Sacrococcygeal disorders, not elsewhere classified: Secondary | ICD-10-CM | POA: Diagnosis not present

## 2022-08-25 DIAGNOSIS — C9000 Multiple myeloma not having achieved remission: Secondary | ICD-10-CM | POA: Diagnosis not present

## 2022-08-25 DIAGNOSIS — E876 Hypokalemia: Secondary | ICD-10-CM | POA: Diagnosis not present

## 2022-08-25 DIAGNOSIS — Z5112 Encounter for antineoplastic immunotherapy: Secondary | ICD-10-CM | POA: Diagnosis not present

## 2022-08-25 DIAGNOSIS — Z515 Encounter for palliative care: Secondary | ICD-10-CM | POA: Diagnosis not present

## 2022-08-25 DIAGNOSIS — Z881 Allergy status to other antibiotic agents status: Secondary | ICD-10-CM | POA: Diagnosis not present

## 2022-08-25 DIAGNOSIS — I10 Essential (primary) hypertension: Secondary | ICD-10-CM | POA: Diagnosis not present

## 2022-08-25 DIAGNOSIS — M5126 Other intervertebral disc displacement, lumbar region: Secondary | ICD-10-CM | POA: Diagnosis not present

## 2022-08-25 DIAGNOSIS — Z7961 Long term (current) use of immunomodulator: Secondary | ICD-10-CM | POA: Diagnosis not present

## 2022-08-25 DIAGNOSIS — Z823 Family history of stroke: Secondary | ICD-10-CM | POA: Diagnosis not present

## 2022-08-25 DIAGNOSIS — R21 Rash and other nonspecific skin eruption: Secondary | ICD-10-CM | POA: Diagnosis not present

## 2022-08-25 DIAGNOSIS — Z79899 Other long term (current) drug therapy: Secondary | ICD-10-CM | POA: Diagnosis not present

## 2022-08-25 DIAGNOSIS — K5900 Constipation, unspecified: Secondary | ICD-10-CM | POA: Diagnosis not present

## 2022-08-25 DIAGNOSIS — Z8249 Family history of ischemic heart disease and other diseases of the circulatory system: Secondary | ICD-10-CM | POA: Diagnosis not present

## 2022-08-25 DIAGNOSIS — R600 Localized edema: Secondary | ICD-10-CM | POA: Diagnosis not present

## 2022-08-25 DIAGNOSIS — Z923 Personal history of irradiation: Secondary | ICD-10-CM | POA: Diagnosis not present

## 2022-08-25 DIAGNOSIS — R200 Anesthesia of skin: Secondary | ICD-10-CM | POA: Diagnosis not present

## 2022-08-25 DIAGNOSIS — Z86718 Personal history of other venous thrombosis and embolism: Secondary | ICD-10-CM | POA: Diagnosis not present

## 2022-08-25 DIAGNOSIS — J90 Pleural effusion, not elsewhere classified: Secondary | ICD-10-CM | POA: Diagnosis not present

## 2022-08-25 LAB — CMP (CANCER CENTER ONLY)
ALT: 16 U/L (ref 0–44)
AST: 11 U/L — ABNORMAL LOW (ref 15–41)
Albumin: 3.7 g/dL (ref 3.5–5.0)
Alkaline Phosphatase: 96 U/L (ref 38–126)
Anion gap: 7 (ref 5–15)
BUN: 28 mg/dL — ABNORMAL HIGH (ref 8–23)
CO2: 35 mmol/L — ABNORMAL HIGH (ref 22–32)
Calcium: 8.9 mg/dL (ref 8.9–10.3)
Chloride: 100 mmol/L (ref 98–111)
Creatinine: 0.92 mg/dL (ref 0.61–1.24)
GFR, Estimated: >60 mL/min (ref >60)
Glucose, Bld: 96 mg/dL (ref 70–99)
Potassium: 3 mmol/L — ABNORMAL LOW (ref 3.5–5.1)
Sodium: 142 mmol/L (ref 135–145)
Total Bilirubin: 0.4 mg/dL (ref 0.3–1.2)
Total Protein: 5.7 g/dL — ABNORMAL LOW (ref 6.5–8.1)

## 2022-08-25 LAB — CBC WITH DIFFERENTIAL (CANCER CENTER ONLY)
Abs Immature Granulocytes: 0.06 10*3/uL (ref 0.00–0.07)
Basophils Absolute: 0.1 10*3/uL (ref 0.0–0.1)
Basophils Relative: 1 %
Eosinophils Absolute: 0.4 10*3/uL (ref 0.0–0.5)
Eosinophils Relative: 6 %
HCT: 34.4 % — ABNORMAL LOW (ref 39.0–52.0)
Hemoglobin: 11.1 g/dL — ABNORMAL LOW (ref 13.0–17.0)
Immature Granulocytes: 1 %
Lymphocytes Relative: 7 %
Lymphs Abs: 0.6 10*3/uL — ABNORMAL LOW (ref 0.7–4.0)
MCH: 32.4 pg (ref 26.0–34.0)
MCHC: 32.3 g/dL (ref 30.0–36.0)
MCV: 100.3 fL — ABNORMAL HIGH (ref 80.0–100.0)
Monocytes Absolute: 0.9 10*3/uL (ref 0.1–1.0)
Monocytes Relative: 11 %
Neutro Abs: 5.6 10*3/uL (ref 1.7–7.7)
Neutrophils Relative %: 74 %
Platelet Count: 202 10*3/uL (ref 150–400)
RBC: 3.43 MIL/uL — ABNORMAL LOW (ref 4.22–5.81)
RDW: 16.4 % — ABNORMAL HIGH (ref 11.5–15.5)
WBC Count: 7.6 10*3/uL (ref 4.0–10.5)
nRBC: 0 % (ref 0.0–0.2)

## 2022-08-25 LAB — BRAIN NATRIURETIC PEPTIDE: B Natriuretic Peptide: 194.6 pg/mL — ABNORMAL HIGH (ref 0.0–100.0)

## 2022-08-25 MED ORDER — BORTEZOMIB CHEMO SQ INJECTION 3.5 MG (2.5MG/ML)
1.3000 mg/m2 | Freq: Once | INTRAMUSCULAR | Status: AC
  Administered 2022-08-25: 3 mg via SUBCUTANEOUS
  Filled 2022-08-25: qty 1.2

## 2022-08-25 NOTE — Patient Instructions (Signed)
[facility]  Discharge Instructions: Thank you for choosing [facility] to provide your oncology and hematology care.   If you have a lab appointment with the Cancer Center, please go directly to the Cancer Center and check in at the registration area.   Wear comfortable clothing and clothing appropriate for easy access to any Portacath or PICC line.   We strive to give you quality time with your provider. You may need to reschedule your appointment if you arrive late (15 or more minutes).  Arriving late affects you and other patients whose appointments are after yours.  Also, if you miss three or more appointments without notifying the office, you may be dismissed from the clinic at the provider's discretion.      For prescription refill requests, have your pharmacy contact our office and allow 72 hours for refills to be completed.    Today you received the following chemotherapy and/or immunotherapy agents: Velcade     To help prevent nausea and vomiting after your treatment, we encourage you to take your nausea medication as directed.  BELOW ARE SYMPTOMS THAT SHOULD BE REPORTED IMMEDIATELY: *FEVER GREATER THAN 100.4 F (38 C) OR HIGHER *CHILLS OR SWEATING *NAUSEA AND VOMITING THAT IS NOT CONTROLLED WITH YOUR NAUSEA MEDICATION *UNUSUAL SHORTNESS OF BREATH *UNUSUAL BRUISING OR BLEEDING *URINARY PROBLEMS (pain or burning when urinating, or frequent urination) *BOWEL PROBLEMS (unusual diarrhea, constipation, pain near the anus) TENDERNESS IN MOUTH AND THROAT WITH OR WITHOUT PRESENCE OF ULCERS (sore throat, sores in mouth, or a toothache) UNUSUAL RASH, SWELLING OR PAIN  UNUSUAL VAGINAL DISCHARGE OR ITCHING   Items with * indicate a potential emergency and should be followed up as soon as possible or go to the Emergency Department if any problems should occur.  Please show the CHEMOTHERAPY ALERT CARD or IMMUNOTHERAPY ALERT CARD at check-in  to the Emergency Department and triage nurse.  Should you have questions after your visit or need to cancel or reschedule your appointment, please contact [facility]  Dept: [phone]  and follow the prompts.  Office hours are 8:00 a.m. to 4:30 p.m. Monday - Friday. Please note that voicemails left after 4:00 p.m. may not be returned until the following business day.  We are closed weekends and major holidays. You have access to a nurse at all times for urgent questions. Please call the main number to the clinic Dept: [phone] and follow the prompts.   For any non-urgent questions, you may also contact your provider using MyChart. We now offer e-Visits for anyone 18 and older to request care online for non-urgent symptoms. For details visit [email or web address].   Also download the MyChart app! Go to the app store, search "MyChart", open the app, select [facility], and log in with your MyChart username and password.   

## 2022-08-25 NOTE — Progress Notes (Signed)
  Radiation Oncology         [phone] ________________________________   Patient Name: Travis [name] MRN: [id number] DOB: 05-03-1951 Referring Physician: Serena Croissant (Profile Not Attached) Date of Service: 07/23/2022 [facility], [place]                                                        End Of Treatment Note  Diagnoses: C90.00-Multiple myeloma not having achieved remission  Cancer Staging: The encounter diagnosis was Multiple myeloma, remission status unspecified (HCC).   Multiple myeloma with lytic lesions in the right humerus, right ulna, right 6th rib and thoracic spine    Intent: Palliative  Radiation Treatment Dates: 07/13/2022 through 07/23/2022 Site Technique Total Dose (Gy) Dose per Fx (Gy) Completed Fx Beam Energies  Thoracic Spine: Spine 3D 27/27 3 9/9 10X   Narrative: The patient tolerated radiation therapy relatively well. During his final weekly treatment check on 07/20/22, the patient reported pain to the thoracic spine rated at a 3/10, and fatigue. He denied any other issues.   Plan: The patient will follow-up with radiation oncology in one month .  ________________________________________________  -----------------------------------  Billie Lade, PhD, MD  This document serves as a record of services personally performed by Antony Blackbird, MD. It was created on his behalf by Neena Rhymes, a trained medical scribe. The creation of this record is based on the scribe's personal observations and the provider's statements to them. This document has been checked and approved by the attending provider.

## 2022-08-26 ENCOUNTER — Ambulatory Visit
Admission: RE | Admit: 2022-08-26 | Discharge: 2022-08-26 | Disposition: A | Discharge status: Home / Self Care | Payer: Medicare Other | Source: Ambulatory Visit | Attending: Radiation Oncology | Admitting: Radiation Oncology

## 2022-08-26 ENCOUNTER — Other Ambulatory Visit: Payer: Self-pay

## 2022-08-26 ENCOUNTER — Encounter: Payer: Self-pay | Admitting: Radiation Oncology

## 2022-08-26 VITALS — BP 135/57 | HR 53 | Temp 98.0°F | Resp 18 | Ht 70.0 in | Wt 240.4 lb

## 2022-08-26 DIAGNOSIS — C9000 Multiple myeloma not having achieved remission: Secondary | ICD-10-CM | POA: Diagnosis not present

## 2022-08-26 DIAGNOSIS — Z79899 Other long term (current) drug therapy: Secondary | ICD-10-CM | POA: Diagnosis not present

## 2022-08-26 DIAGNOSIS — J90 Pleural effusion, not elsewhere classified: Secondary | ICD-10-CM | POA: Diagnosis not present

## 2022-08-26 DIAGNOSIS — Z7901 Long term (current) use of anticoagulants: Secondary | ICD-10-CM | POA: Insufficient documentation

## 2022-08-26 DIAGNOSIS — Z7952 Long term (current) use of systemic steroids: Secondary | ICD-10-CM | POA: Diagnosis not present

## 2022-08-26 DIAGNOSIS — Z79624 Long term (current) use of inhibitors of nucleotide synthesis: Secondary | ICD-10-CM | POA: Diagnosis not present

## 2022-08-26 DIAGNOSIS — Z923 Personal history of irradiation: Secondary | ICD-10-CM | POA: Diagnosis not present

## 2022-08-26 DIAGNOSIS — H6502 Acute serous otitis media, left ear: Secondary | ICD-10-CM | POA: Diagnosis not present

## 2022-08-26 DIAGNOSIS — Z7961 Long term (current) use of immunomodulator: Secondary | ICD-10-CM | POA: Insufficient documentation

## 2022-08-26 HISTORY — DX: Personal history of irradiation: Z92.3

## 2022-08-26 MED ORDER — HYDRALAZINE HCL 50 MG PO TABS: 50.0000 mg | ORAL_TABLET | Freq: Three times a day (TID) | ORAL | 3 refills | Status: DC

## 2022-08-26 NOTE — Addendum Note (Signed)
Addended by: Bertram Millard on: 08/26/2022 10:42 AM   Modules accepted: Orders

## 2022-08-26 NOTE — Progress Notes (Signed)
Travis [name] is here today for follow up post radiation to the thoracic spine.    They completed their radiation on: 07/23/22  Does the patient complain of any of the following: Pain: Patient reports pain to neck and shoulders. Patient currently in physical therapy once weekly.  Fatigue: mild Swallowing: No, swallowing has greatly improved.  Nausea: No  Bowels: No Appetite good/fair/poor: Good  Additional comments if applicable:  Reports swelling to bilateral lower extremities. Patient started on metolazone in addition with lasix.   BP (!) 135/57   Pulse (!) 53   Temp 98 F (36.7 C)   Resp 18   Ht  (1.778 m)   Wt 240 lb 6.4 oz (109 kg)   SpO2 96%   BMI 34.49 kg/m

## 2022-08-26 NOTE — Telephone Encounter (Signed)
Travis [name] is a little low  ON day he takes metolazone I would recomm taking 2 extra KCL  Fluid number is better   Re BP    I would increase hydralazine to 75 mg tid

## 2022-08-26 NOTE — Telephone Encounter (Addendum)
Pt advised to take Hydralazine 50 mg TID... he says that Oncology had seen is K 3.0 but did not change anything.. he is having labs with them again next week.    (He had been taking Hydralazine 50,25,50)   K dosing currently:  Take 2 tablets (40 mEq total) by mouth daily. Take an extra tablet on the day you take the Metolazone.

## 2022-08-26 NOTE — Progress Notes (Signed)
Radiation Oncology         [phone] ________________________________  Name: Travis [name] MRN: [id number]  Date: 08/26/2022  DOB: 23-Apr-1951  Follow-Up Visit Note  CC: [name], MD  Serena Croissant, MD    ICD-10-CM   1. 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 The patient tolerated radiation therapy relatively well. During his final weekly treatment check on 72/19/24, the patient reported pain to the thoracic spine rated at a 3/10, and fatigue. He denied any other issues at that time.   Prior to starting radiation therapy, the patient was hospitalized from 06/29/22 through 07/06/22. On presentation, he had shortness of breath and hypoxia and was admitted for acute hypoxic respiratory failure with acute on chronic diastolic CHF. CTA of the chest performed was negative for PE, however multifocal bilateral airspace disease consistent with bilateral pneumonia was noted. Small bilateral pleural  effusions were also appreciated. Hospital course included antibiotics (coronavirus OC 43 positive> procalcitonin less than 0.1 and abx were subsequently discontinued). He was also found to have persistent A-fib and required amiodarone which was transitioned to PO by cardiology. He was additionally treated with diuretics for acute on chronic diastolic CHF. He was sent home with PRN lasix and a steroid taper at discharge.      -- CT of the thoracic spine performed while inpatient on 06/29/22 showed stability of the lytic lesions within the thoracolumbar spine and thoracic cage, consistent with known multiple myeloma. Interval postsurgical changes from T2 corpectomy, spacer placement, and T1-T4 posterior fusion, were also appreciated. No acute fractures were noted.   On 08/02/22, the patient presented to the ED with odynophagia as well as left-sided ear pain/drainage. He was noted to report persistence/an increase in his symptoms even while using viscous lidocaine before eating, as well as prophylactic treatment for thrush. Soft tissue neck CT performed in the the ED showed edema along the posterior aspect of the hypopharynx, proximal larynx, and proximal esophagus, favored to represent the sequela of recent radiation therapy. A fluid collection in the soft tissues posterior to the spine and in the paraspinous musculature was also appreciated, favored to be related to the patient's recent spine surgery, and possibly representing a postoperative seroma vs abscess. Moderate right pleural effusion with associated atelectasis was also appreciated. CT otherwise showed no evidence of mass or abscess. Work-up was overall unremarkable, and the patient was instructed to follow-up with his PCP for further management of his symptoms.   Since completing radiation therapy,  the patient has continued with Velcade/Pomalyst/Dex therapy under the care of Dr. Leonides Schanz (first cycle on 06/23/22). During a recent follow up visit with Dr.  Leonides Schanz on 08/04/22 (prior to initiating cycle 2), the patient reported having some persistent sore throat from radiation therapy. He reports using lidocaine swish and swallow which has been helping this some. The patient also reported issues with ongoing/worsening LE edema during a visit with Dr. Derek Mound office on 08/18/22. Due to his history of DVT, a repeat LE DVT study was performed on the date of this visit which showed no evidence of DVT in either LE.            Patient is present with his supportive wife today. He reports to be doing well overall since the radiation. He did experience severe throat pain starting on his second to last treatment and lasting for about 10 days after his last treatment. The lidocaine swish and swallow helped alleviate this pain. This has since gone away. He denies any back pain today. He is seeing PT once per week and states this is really helping and is hoping to be able to increase it to 2x per week.      Allergies:  is allergic to linezolid and vancomycin.  Meds: Current Outpatient Medications  Medication Sig Dispense Refill   acetaminophen (TYLENOL) 325 MG tablet Take 2 tablets (650 mg total) by mouth every 6 (six) hours as needed. 36 tablet 0   acyclovir (ZOVIRAX) 400 MG tablet Take 1 tablet (400 mg total) by mouth 2 (two) times daily. 180 tablet 3   amiodarone (PACERONE) 200 MG tablet Take 1 tablet (200 mg total) by mouth daily. Start after finishing 40 mg twice daily dose of  days 180 tablet 1   apixaban (ELIQUIS) 5 MG TABS tablet Take 1 tablet (5 mg total) by mouth 2 (two) times daily. 60 tablet 11   cholecalciferol (VITAMIN D3) 25 MCG (1000 UNIT) tablet Take 1,000 Units by mouth daily.     Cyanocobalamin (VITAMIN B-12 PO) Take 1,000 mcg by mouth daily.     dexamethasone (DECADRON) 4 MG tablet Take 40 mg by mouth See admin instructions. Take 40 mg by mouth once a day the morning of chemo on Wednesday- or unless otherwise instructed     fluticasone (FLONASE)  50 MCG/ACT nasal spray Place 1 spray into both nostrils daily as needed for allergies or rhinitis.     folic acid (FOLVITE) 1 MG tablet Take 1 mg by mouth daily.     furosemide (LASIX) 40 MG tablet Take 2 tablets (80 mg total) by mouth daily. 180 tablet 0   gabapentin (NEURONTIN) 300 MG capsule TAKE TWO CAPSULES BY MOUTH TWICE A DAY 360 capsule 1   hydrALAZINE (APRESOLINE) 50 MG tablet Take 1 tablet (50 mg total) by mouth 3 (three) times daily. 270 tablet 3   lisinopril (ZESTRIL) 40 MG tablet TAKE ONE TABLET BY MOUTH ONE TIME DAILY 90 tablet 3   metolazone (ZAROXOLYN) 2.5 MG tablet Take 2.5 mg by mouth every other day. 30 minutes prior to taking your morning Lasix dose only.     Omega-3 Fatty Acids (FISH OIL PO) Take 1 Capful by mouth daily.     pomalidomide (POMALYST) 2 MG capsule Take 1 capsule (2 mg total) by mouth daily. Take one tablet (2 mg by mouth) daily for 21 days, then take 7 days off 21 capsule 0   potassium chloride SA (KLOR-CON M) 20 MEQ tablet Take 2 tablets (40  mEq total) by mouth daily. Take an extra tablet on the day you take the Metolazone (60 meq total) 180 tablet 3   sodium chloride (OCEAN) 0.65 % SOLN nasal spray Place 1 spray into both nostrils as needed for congestion.     No current facility-administered medications for this encounter.    Physical Findings: The patient is in no acute distress. Patient is alert and oriented.  height is 5\' 10"  (1.778 m) and weight is 240 lb 6.4 oz (109 kg). His temperature is 98 F (36.7 C). His blood pressure is 135/57 (abnormal) and his pulse is 53 (abnormal). His respiration is 18 and oxygen saturation is 96%. .   Lungs are clear to auscultation bilaterally. Heart has regular rate and rhythm. No palpable cervical, supraclavicular, or axillary adenopathy. Abdomen soft, non-tender, normal bowel sounds. No tenderness to palpation along the spine. No concerning lesions to the area treated.    Lab Findings: Lab Results  Component Value  Date   WBC 7.6 08/25/2022   HGB 11.1 (L) 08/25/2022   HCT 34.4 (L) 08/25/2022   MCV 100.3 (H) 08/25/2022   PLT 202 08/25/2022    Radiographic Findings: VAS [place] LOWER EXTREMITY VENOUS (DVT)  Result Date: 08/18/2022  Lower Venous DVT Study Patient Name:  TELESFORO BROSNAHAN [facility]  Date of Exam:   08/18/2022 Medical Rec #: [id number]         Accession #:    [id number] Date of Birth: 06-Nov-1950        Patient Gender: M Patient Age:   87 years Exam Location:  [facility] Procedure:      VAS [place] LOWER EXTREMITY VENOUS (DVT) Referring Phys: Georga Kaufmann --------------------------------------------------------------------------------  Indications: Asymmetrical swelling LT>RT, s/p thoracic spine, 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  Anticoagulation: Eliquis. Limitations: Poor ultrasound/tissue interface. Comparison Study: Previous 06/21/22 positive in LLE. Performing Technologist: McKayla Maag RVT, VT  Examination Guidelines: A complete evaluation includes B-mode imaging, spectral Doppler, color Doppler, and power Doppler as needed of all accessible portions of each vessel. Bilateral testing is considered an integral part of a complete examination. Limited examinations for reoccurring indications may be performed as noted. The reflux portion of the exam is performed with the patient in reverse Trendelenburg.  +---------+---------------+---------+-----------+----------+--------------+ RIGHT    CompressibilityPhasicitySpontaneityPropertiesThrombus Aging +---------+---------------+---------+-----------+----------+--------------+ CFV      Full           Yes      Yes                                 +---------+---------------+---------+-----------+----------+--------------+ SFJ      Full                                                        +---------+---------------+---------+-----------+----------+--------------+ FV Prox  Full                                                         +---------+---------------+---------+-----------+----------+--------------+ FV Mid   Full                                                        +---------+---------------+---------+-----------+----------+--------------+  FV DistalFull                                                        +---------+---------------+---------+-----------+----------+--------------+ PFV      Full                                                        +---------+---------------+---------+-----------+----------+--------------+ POP      Full           Yes      Yes                                 +---------+---------------+---------+-----------+----------+--------------+ PTV      Full                                                        +---------+---------------+---------+-----------+----------+--------------+ PERO     Full                                                        +---------+---------------+---------+-----------+----------+--------------+   +---------+---------------+---------+-----------+----------+--------------+ LEFT     CompressibilityPhasicitySpontaneityPropertiesThrombus Aging +---------+---------------+---------+-----------+----------+--------------+ CFV      Full           Yes      Yes                                 +---------+---------------+---------+-----------+----------+--------------+ SFJ      Full                                                        +---------+---------------+---------+-----------+----------+--------------+ FV Prox  Full                                                        +---------+---------------+---------+-----------+----------+--------------+ FV Mid   Full                                                        +---------+---------------+---------+-----------+----------+--------------+ FV DistalFull                                                         +---------+---------------+---------+-----------+----------+--------------+  PFV      Full                                                        +---------+---------------+---------+-----------+----------+--------------+ POP      Full           Yes      Yes                                 +---------+---------------+---------+-----------+----------+--------------+ PTV      Full                                                        +---------+---------------+---------+-----------+----------+--------------+ PERO     Full                                                        +---------+---------------+---------+-----------+----------+--------------+     Summary: BILATERAL: - No evidence of deep vein thrombosis seen in the lower extremities, bilaterally. - No evidence of superficial venous thrombosis in the lower extremities, bilaterally. -No evidence of popliteal cyst, bilaterally.   *See table(s) above for measurements and observations. Electronically signed by Gerarda Fraction on 08/18/2022 at 10:33:02 PM.    Final    CT Soft Tissue Neck W Contrast  Result Date: 08/02/2022 CLINICAL DATA:  Left ear pain, history of multiple myeloma, recently finished radiation to upper thoracic spine for cancer EXAM: CT NECK WITH CONTRAST TECHNIQUE: Multidetector CT imaging of the neck was performed using the standard protocol following the bolus administration of intravenous contrast. RADIATION DOSE REDUCTION: This exam was performed according to the departmental dose-optimization program which includes automated exposure control, adjustment of the mA and/or kV according to patient size and/or use of iterative reconstruction technique. CONTRAST:  75mL OMNIPAQUE IOHEXOL 300 MG/ML  SOLN COMPARISON:  No prior neck CT, correlation is made with 06/29/2022 CTA chest FINDINGS: Pharynx and larynx: No acute finding in the nasopharynx or oropharynx. Edema along the posterior aspect of the hypopharynx/proximal larynx  (series 5, image 87), with some thickening of the proximal esophagus. No mass is seen. Salivary glands: No inflammation, mass, or stone. Thyroid: Normal. Lymph nodes: None enlarged or abnormal density. Vascular: Patent. Limited intracranial: Negative. Visualized orbits: Negative. Mastoids and visualized paranasal sinuses: Minimal mucosal thickening in the left maxillary sinus. Fluid in left mastoid air cells. Otherwise clear. Skeleton: Status post T1-T4 posterior fusion with T2 corpectomy spacer. No acute fracture in the imaged cervical or thoracic spine, although evaluation is somewhat limited by beam hardening artifact from the patient's hardware. Fluid collection in the soft tissues posterior to the spine and in the paraspinous musculature, which surrounds the spinous processes of C7 and T1. This collection measures up to 3.9 x 6.3 x 6.4 cm (AP x TR x CC) (series 5, image 92 and series 7, image 67). This is adjacent to the postsurgical changes in the subcutaneous tissues and may be postsurgical; this is likely  present and larger on the 06/29/2022 CTA chest, where it is obscured by beam hardening artifact. Upper chest: Debris in the esophagus, which increases the risk of aspiration. Moderate right pleural effusion with associated atelectasis. Other: None. IMPRESSION: 1. Edema along the posterior aspect of the hypopharynx, proximal larynx, and proximal esophagus, favored to represent the sequela of recent radiation therapy. No mass or abscess seen. 2. Fluid collection in the soft tissues posterior to the spine and in the paraspinous musculature, favored to be related to the patient's recent spine surgery and likely present on the prior CTA chest. While this may represent a postoperative seroma, an abscess cannot be excluded. 3. Moderate right pleural effusion with associated atelectasis. 4. Debris in the esophagus, which increases the risk of aspiration. Electronically Signed   By: Wiliam Ke M.D.   On:  08/02/2022 21:53    Impression:  The encounter diagnosis was Multiple myeloma, remission status unspecified (HCC).   Multiple myeloma with lytic lesions in the right humerus, right ulna, right 6th rib and thoracic spine    The patient is recovering well from the effects of radiation. He is receiving Velcade/Pomalyst/Dex therapy.  Plan:  Patient will continue follow-up under the care of Dr. Leonides Schanz. Radiation oncology follow-up PRN at this time. It was an honor to partake in this patient's care. He knows he can call back with any questions or concerns in the meantime.   Order placed to increase PT to two times per week, once he completes his in home physical therapy.   ____________________________________   Joyice Faster, PA-C   Billie Lade, PhD, MD  This document serves as a record of services personally performed by Antony Blackbird, MD. It was created on his behalf by Neena Rhymes, a trained medical scribe. The creation of this record is based on the scribe's personal observations and the provider's statements to them. This document has been checked and approved by the attending provider.

## 2022-08-27 ENCOUNTER — Other Ambulatory Visit: Payer: Self-pay | Admitting: *Deleted

## 2022-08-27 DIAGNOSIS — C9000 Multiple myeloma not having achieved remission: Secondary | ICD-10-CM

## 2022-08-27 LAB — MISC LABCORP TEST (SEND OUT): Labcorp test code: 143000

## 2022-08-27 MED ORDER — POMALIDOMIDE 2 MG PO CAPS
2.0000 mg | ORAL_CAPSULE | Freq: Every day | ORAL | 0 refills | Status: DC
Start: 2022-08-27 — End: 2022-08-27

## 2022-08-27 MED ORDER — POMALIDOMIDE 2 MG PO CAPS
2.0000 mg | ORAL_CAPSULE | Freq: Every day | ORAL | 0 refills | Status: DC
Start: 2022-08-27 — End: 2022-09-17

## 2022-08-30 DIAGNOSIS — Z4789 Encounter for other orthopedic aftercare: Secondary | ICD-10-CM | POA: Diagnosis not present

## 2022-08-30 DIAGNOSIS — I5033 Acute on chronic diastolic (congestive) heart failure: Secondary | ICD-10-CM | POA: Diagnosis not present

## 2022-08-30 DIAGNOSIS — C9000 Multiple myeloma not having achieved remission: Secondary | ICD-10-CM | POA: Diagnosis not present

## 2022-08-30 DIAGNOSIS — Z7901 Long term (current) use of anticoagulants: Secondary | ICD-10-CM | POA: Diagnosis not present

## 2022-08-30 DIAGNOSIS — G4733 Obstructive sleep apnea (adult) (pediatric): Secondary | ICD-10-CM | POA: Diagnosis not present

## 2022-08-30 DIAGNOSIS — G629 Polyneuropathy, unspecified: Secondary | ICD-10-CM | POA: Diagnosis not present

## 2022-08-30 DIAGNOSIS — I82402 Acute embolism and thrombosis of unspecified deep veins of left lower extremity: Secondary | ICD-10-CM | POA: Diagnosis not present

## 2022-08-30 DIAGNOSIS — Z981 Arthrodesis status: Secondary | ICD-10-CM | POA: Diagnosis not present

## 2022-08-30 DIAGNOSIS — I110 Hypertensive heart disease with heart failure: Secondary | ICD-10-CM | POA: Diagnosis not present

## 2022-08-30 DIAGNOSIS — I480 Paroxysmal atrial fibrillation: Secondary | ICD-10-CM | POA: Diagnosis not present

## 2022-08-30 MED ORDER — HYDRALAZINE HCL 50 MG PO TABS: 75.0000 mg | ORAL_TABLET | Freq: Three times a day (TID) | ORAL | 3 refills | Status: DC

## 2022-08-30 NOTE — Addendum Note (Signed)
Addended by: Bertram Millard on: 08/30/2022 09:08 AM   Modules accepted: Orders

## 2022-09-01 ENCOUNTER — Inpatient Hospital Stay: Payer: Medicare Other

## 2022-09-01 ENCOUNTER — Inpatient Hospital Stay: Payer: Medicare Other | Attending: Hematology and Oncology | Admitting: Physician Assistant

## 2022-09-01 VITALS — BP 129/62 | HR 51 | Temp 97.4°F | Resp 16

## 2022-09-01 VITALS — BP 139/51 | HR 54 | Temp 98.2°F | Resp 16 | Ht 70.0 in | Wt 248.0 lb

## 2022-09-01 DIAGNOSIS — C9000 Multiple myeloma not having achieved remission: Secondary | ICD-10-CM

## 2022-09-01 DIAGNOSIS — J90 Pleural effusion, not elsewhere classified: Secondary | ICD-10-CM | POA: Insufficient documentation

## 2022-09-01 DIAGNOSIS — Z923 Personal history of irradiation: Secondary | ICD-10-CM | POA: Insufficient documentation

## 2022-09-01 DIAGNOSIS — Z7961 Long term (current) use of immunomodulator: Secondary | ICD-10-CM | POA: Diagnosis not present

## 2022-09-01 DIAGNOSIS — Z881 Allergy status to other antibiotic agents status: Secondary | ICD-10-CM | POA: Diagnosis not present

## 2022-09-01 DIAGNOSIS — Z818 Family history of other mental and behavioral disorders: Secondary | ICD-10-CM | POA: Insufficient documentation

## 2022-09-01 DIAGNOSIS — Z823 Family history of stroke: Secondary | ICD-10-CM | POA: Diagnosis not present

## 2022-09-01 DIAGNOSIS — R1310 Dysphagia, unspecified: Secondary | ICD-10-CM | POA: Diagnosis not present

## 2022-09-01 DIAGNOSIS — Z5112 Encounter for antineoplastic immunotherapy: Secondary | ICD-10-CM | POA: Insufficient documentation

## 2022-09-01 DIAGNOSIS — K5900 Constipation, unspecified: Secondary | ICD-10-CM | POA: Insufficient documentation

## 2022-09-01 DIAGNOSIS — Z79899 Other long term (current) drug therapy: Secondary | ICD-10-CM | POA: Insufficient documentation

## 2022-09-01 DIAGNOSIS — E876 Hypokalemia: Secondary | ICD-10-CM | POA: Insufficient documentation

## 2022-09-01 DIAGNOSIS — M7989 Other specified soft tissue disorders: Secondary | ICD-10-CM | POA: Insufficient documentation

## 2022-09-01 DIAGNOSIS — Z86718 Personal history of other venous thrombosis and embolism: Secondary | ICD-10-CM | POA: Insufficient documentation

## 2022-09-01 DIAGNOSIS — Z8249 Family history of ischemic heart disease and other diseases of the circulatory system: Secondary | ICD-10-CM | POA: Insufficient documentation

## 2022-09-01 DIAGNOSIS — Z7901 Long term (current) use of anticoagulants: Secondary | ICD-10-CM | POA: Diagnosis not present

## 2022-09-01 DIAGNOSIS — M5126 Other intervertebral disc displacement, lumbar region: Secondary | ICD-10-CM | POA: Diagnosis not present

## 2022-09-01 DIAGNOSIS — I480 Paroxysmal atrial fibrillation: Secondary | ICD-10-CM | POA: Insufficient documentation

## 2022-09-01 DIAGNOSIS — Z7952 Long term (current) use of systemic steroids: Secondary | ICD-10-CM | POA: Diagnosis not present

## 2022-09-01 DIAGNOSIS — Z515 Encounter for palliative care: Secondary | ICD-10-CM | POA: Diagnosis not present

## 2022-09-01 DIAGNOSIS — M533 Sacrococcygeal disorders, not elsewhere classified: Secondary | ICD-10-CM | POA: Diagnosis not present

## 2022-09-01 LAB — CBC WITH DIFFERENTIAL (CANCER CENTER ONLY)
Abs Immature Granulocytes: 0.03 10*3/uL (ref 0.00–0.07)
Basophils Absolute: 0.1 10*3/uL (ref 0.0–0.1)
Basophils Relative: 2 %
Eosinophils Absolute: 0.6 10*3/uL — ABNORMAL HIGH (ref 0.0–0.5)
Eosinophils Relative: 13 %
HCT: 33.1 % — ABNORMAL LOW (ref 39.0–52.0)
Hemoglobin: 10.7 g/dL — ABNORMAL LOW (ref 13.0–17.0)
Immature Granulocytes: 1 %
Lymphocytes Relative: 11 %
Lymphs Abs: 0.5 10*3/uL — ABNORMAL LOW (ref 0.7–4.0)
MCH: 32.1 pg (ref 26.0–34.0)
MCHC: 32.3 g/dL (ref 30.0–36.0)
MCV: 99.4 fL (ref 80.0–100.0)
Monocytes Absolute: 0.9 10*3/uL (ref 0.1–1.0)
Monocytes Relative: 21 %
Neutro Abs: 2.3 10*3/uL (ref 1.7–7.7)
Neutrophils Relative %: 52 %
Platelet Count: 169 10*3/uL (ref 150–400)
RBC: 3.33 MIL/uL — ABNORMAL LOW (ref 4.22–5.81)
RDW: 16.2 % — ABNORMAL HIGH (ref 11.5–15.5)
WBC Count: 4.5 10*3/uL (ref 4.0–10.5)
nRBC: 0 % (ref 0.0–0.2)

## 2022-09-01 LAB — CMP (CANCER CENTER ONLY)
ALT: 20 U/L (ref 0–44)
AST: 13 U/L — ABNORMAL LOW (ref 15–41)
Albumin: 3.8 g/dL (ref 3.5–5.0)
Alkaline Phosphatase: 80 U/L (ref 38–126)
Anion gap: 7 (ref 5–15)
BUN: 20 mg/dL (ref 8–23)
CO2: 33 mmol/L — ABNORMAL HIGH (ref 22–32)
Calcium: 8.5 mg/dL — ABNORMAL LOW (ref 8.9–10.3)
Chloride: 102 mmol/L (ref 98–111)
Creatinine: 0.9 mg/dL (ref 0.61–1.24)
GFR, Estimated: >60 mL/min (ref >60)
Glucose, Bld: 103 mg/dL — ABNORMAL HIGH (ref 70–99)
Potassium: 2.9 mmol/L — ABNORMAL LOW (ref 3.5–5.1)
Sodium: 142 mmol/L (ref 135–145)
Total Bilirubin: 0.4 mg/dL (ref 0.3–1.2)
Total Protein: 5.8 g/dL — ABNORMAL LOW (ref 6.5–8.1)

## 2022-09-01 MED ORDER — BORTEZOMIB CHEMO SQ INJECTION 3.5 MG (2.5MG/ML)
1.3000 mg/m2 | Freq: Once | INTRAMUSCULAR | Status: AC
  Administered 2022-09-01: 3 mg via SUBCUTANEOUS
  Filled 2022-09-01: qty 1.2

## 2022-09-01 MED ORDER — POTASSIUM CHLORIDE CRYS ER 20 MEQ PO TBCR
40.0000 meq | EXTENDED_RELEASE_TABLET | Freq: Once | ORAL | Status: AC
  Administered 2022-09-01: 40 meq via ORAL
  Filled 2022-09-01: qty 2

## 2022-09-01 NOTE — Patient Instructions (Signed)
[facility]  Discharge Instructions: Thank you for choosing [facility] to provide your oncology and hematology care.   If you have a lab appointment with the Cancer Center, please go directly to the Cancer Center and check in at the registration area.   Wear comfortable clothing and clothing appropriate for easy access to any Portacath or PICC line.   We strive to give you quality time with your provider. You may need to reschedule your appointment if you arrive late (15 or more minutes).  Arriving late affects you and other patients whose appointments are after yours.  Also, if you miss three or more appointments without notifying the office, you may be dismissed from the clinic at the provider's discretion.      For prescription refill requests, have your pharmacy contact our office and allow 72 hours for refills to be completed.    Today you received the following chemotherapy and/or immunotherapy agents velcade      To help prevent nausea and vomiting after your treatment, we encourage you to take your nausea medication as directed.  BELOW ARE SYMPTOMS THAT SHOULD BE REPORTED IMMEDIATELY: *FEVER GREATER THAN 100.4 F (38 C) OR HIGHER *CHILLS OR SWEATING *NAUSEA AND VOMITING THAT IS NOT CONTROLLED WITH YOUR NAUSEA MEDICATION *UNUSUAL SHORTNESS OF BREATH *UNUSUAL BRUISING OR BLEEDING *URINARY PROBLEMS (pain or burning when urinating, or frequent urination) *BOWEL PROBLEMS (unusual diarrhea, constipation, pain near the anus) TENDERNESS IN MOUTH AND THROAT WITH OR WITHOUT PRESENCE OF ULCERS (sore throat, sores in mouth, or a toothache) UNUSUAL RASH, SWELLING OR PAIN  UNUSUAL VAGINAL DISCHARGE OR ITCHING   Items with * indicate a potential emergency and should be followed up as soon as possible or go to the Emergency Department if any problems should occur.  Please show the CHEMOTHERAPY ALERT CARD or IMMUNOTHERAPY ALERT CARD at check-in  to the Emergency Department and triage nurse.  Should you have questions after your visit or need to cancel or reschedule your appointment, please contact [facility]  Dept: [phone]  and follow the prompts.  Office hours are 8:00 a.m. to 4:30 p.m. Monday - Friday. Please note that voicemails left after 4:00 p.m. may not be returned until the following business day.  We are closed weekends and major holidays. You have access to a nurse at all times for urgent questions. Please call the main number to the clinic Dept: [phone] and follow the prompts.   For any non-urgent questions, you may also contact your provider using MyChart. We now offer e-Visits for anyone 18 and older to request care online for non-urgent symptoms. For details visit [email or web address].   Also download the MyChart app! Go to the app store, search "MyChart", open the app, select [facility], and log in with your MyChart username and password.   

## 2022-09-01 NOTE — Progress Notes (Signed)
[facility] [phone]   [phone]  PROGRESS NOTE  Patient Care Team: [name], MD as PCP - General (Internal Medicine) Pricilla Riffle, MD as PCP - Cardiology (Cardiology) Quintella Reichert, MD as PCP - Sleep Medicine (Cardiology) Lanier Prude, MD as PCP - Electrophysiology (Cardiology) [name], MD as Consulting Physician (General Surgery) Vida Rigger, MD as Consulting Physician (Gastroenterology) Quintella Reichert, MD as Consulting Physician (Sleep Medicine) Glendale Chard, DO as Consulting Physician (Neurology)  Hematological/Oncological History # IgA Kappa Multiple Myeloma 05/05/2020: Bone marrow biopsy showed increased number of plasma cells representing 14% of all cells in the aspirate associated with numerous variably sized clusters in the clot and biopsy sections 04/10/2021: M protein 0.3, IgA kappa specificity. Kappa 580.5, Labda 7.0, ratio 82.93.   12/07/2021: Labs show of 1971.3, lambda 5.5, ratio 358.42 12/10/2021: Transition care to Dr. Leonides Schanz. 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  01/26/2022: Cycle 2 Day 1 of Dara/Rev/Dex 02/22/2022: Cycle 3 Day 1 of Dara/Rev/Dex 03/23/2022: Cycle 4 Day 1 of Dara/Rev/Dex 04/20/2022: Cycle 5 Day 1 of Dara/Rev/Dex 05/18/2022: Cycle 6 Day 1 of Dara/Rev/Dex 05/27/2022: MRI cervical/thoracic/lumbar: pathologic fracture at T2 with significant osseous retropulsion and resulting cord compression. Additional smaller lesions within T1 vertebral body, right C7 and T3 articulating facets.  Small Multiple Myeloma lesions scattered at all lumbar levels and in the visible left sacral ala. Largest lumbar tumor is 2.3 cm in the left L1 posterior elements and pedicle. No lumbar extraosseous or epidural tumor extension, or pathologic fracture.Underlying chronic lumbar spine  degeneration, with a new central disc herniation at L3-L4 since 2021 now resulting in mild to moderate degenerative spinal stenosis there. 05/28/2022-06/09/2022: Admitted for T2 pathologic fracture and cord compression. He received urgent radiation therapy for a total of 3.0 Gy. He underwent  thoracic 1 through 4 posterior lateral arthrodesis/fusion and 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  06/23/2022: Cycle 1 Day 1 of Velcade/Pomalyst/Dex 08/04/2022: Cycle 2 Day 1 of Velcade/Pomalyst/Dex. Pomalyst to be decreased to 2mg  PO daily due to cytopenias.  09/01/2022: Cycle 3 Day 1 of Velcade/Pomalyst/Dex.   Interval History:  Travis [name] 72 y.o. male with medical history significant for IgA Kappa Multiple Myeloma who presents for a follow up visit. He was last seen on 08/18/2022. In the interim, he continues on Velcade/Pomalyst/Dex therapy  On exam today Mr. Klingel reports that the swelling in his legs have improved since started metolazine with lasix. He reports his energy levels are stable. He continues to work on his strength and mobility with exercising and physical therapy. He is ambulating with a cane. He is looking forward to his upcoming vacation and plans to take his walker to have more support with ambulation. He reports his appetite is stable.He denies nausea, vomiting or abdominal pain. His bowel habits are unchanged with occasional constipation.He denies easy bruising or signs of active bleeding. He denies any new bone or back pain. He denies fevers, chills, sweats, shortness of breath, chest pain or cough. He has no other complaints. Rest of the 10 point ROS is below.    Overall he is willing and able to proceed with treatment today.  MEDICAL HISTORY:  Past Medical History:  Diagnosis Date   A-fib [facility])    Arthritis    "comes and goes; mostly in hands, occasionally elbow" (04/05/2017)   Complication of anesthesia    "  just wanted to sleep alot  for couple days S/P  VARICOCELE OR" (04/05/2017)   DVT (deep venous thrombosis) (HCC)    LLE   Dysrhythmia    PVCs    GERD (gastroesophageal reflux disease)    History of hiatal hernia    History of radiation therapy    Thoracic Spine- 07/13/22-07/23/22-Dr. Antony Blackbird   Hypertension    Impaired glucose tolerance    Multiple myeloma (HCC)    Obesity (BMI 30-39.9) 07/26/2016   OSA on CPAP 07/26/2016   Mild with AHI 12.5/hr now on CPAP at 14cm H2O   PAF (paroxysmal atrial fibrillation) (HCC)    s/p PVI Ablation in 2018 // Flecainide Rx // Echo 8/21: EF 60-65, no RWMA, mild LVH, normal RV SF, moderate RVE, mild BAE, trivial MR, mild dilation of aortic root (40 mm)   Pneumonia    "may have had walking pneumonia a few years ago" (04/05/2017)   Pre-diabetes    Thrombophlebitis     SURGICAL HISTORY: Past Surgical History:  Procedure Laterality Date   ATRIAL FIBRILLATION ABLATION  04/05/2017   ATRIAL FIBRILLATION ABLATION N/A 04/05/2017   Procedure: ATRIAL FIBRILLATION ABLATION;  Surgeon: [name], MD;  Location: [facility];  Service: Cardiovascular;  Laterality: N/A;   BONE BIOPSY Right 12/17/2021   Procedure: BONE BIOPSY;  Surgeon: [name], MD;  Location: [facility];  Service: Orthopedics;  Laterality: Right;   COMPLETE RIGHT HIP REPLACMENT  01/19/2019   EVALUATION UNDER ANESTHESIA WITH HEMORRHOIDECTOMY N/A 02/24/2017   Procedure: EXAM UNDER ANESTHESIA WITH  HEMORRHOIDECTOMY;  Surgeon: [name], MD;  Location: [facility];  Service: General;  Laterality: N/A;   EXCISIONAL TOTAL HIP ARTHROPLASTY WITH ANTIBIOTIC SPACERS Right 09/25/2020   Procedure: EXCISIONAL TOTAL HIP ARTHROPLASTY WITH ANTIBIOTIC SPACERS;  Surgeon: [name], MD;  Location: [facility];  Service: Orthopedics;  Laterality: Right;   FLEXIBLE SIGMOIDOSCOPY N/A 04/15/2015   Procedure:  UNSEDATED FLEXIBLE SIGMOIDOSCOPY;  Surgeon: [name], MD;  Location: [facility];  Service: Endoscopy;  Laterality: N/A;    HUMERUS IM NAIL Right 12/17/2021   Procedure: INTRAMEDULLARY (IM) NAIL HUMERAL;  Surgeon: [name], MD;  Location: [facility];  Service: Orthopedics;  Laterality: Right;   KNEE ARTHROSCOPY Left ~ 2016   POSTERIOR CERVICAL FUSION/FORAMINOTOMY N/A 06/04/2022   Procedure: Thoracic one - Thoracic Four Posterior lateral arthrodesis/ fusion and Thoracic two Corpectomy;  Surgeon: [name], MD;  Location: [facility];  Service: Neurosurgery;  Laterality: N/A;   REIMPLANTATION OF TOTAL HIP Right 12/25/2020   Procedure: REIMPLANTATION/REVISION OF RIGHT TOTAL HIP VERSUS REPEAT IRRIGATION AND DEBRIDEMENT;  Surgeon: [name], MD;  Location: [facility];  Service: Orthopedics;  Laterality: Right;   TESTICLE SURGERY  1988   VARICOCELE   TONSILLECTOMY     VARICOSE VEIN SURGERY Bilateral     SOCIAL HISTORY: Social History   Socioeconomic History   Marital status: Married    Spouse name: Not on file   Number of children: 1   Years of education: law school   Highest education level: Not on file  Occupational History   Occupation: lawyer  Tobacco Use   Smoking status: Never   Smokeless tobacco: Never  Vaping Use   Vaping Use: Never used  Substance and Sexual Activity   Alcohol use: Yes    Alcohol/week: 2.0 standard drinks of alcohol    Types: 2 Glasses of wine per week    Comment: 2 glasses of wine a week,  occ bourbon, beer   Drug use: No   Sexual activity: Not Currently  Other Topics Concern   Not on file  Social History Narrative   Lives in [place] with spouse in a 2 story home.  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 1 daughter.     Education: Social worker school.    Social Determinants of Health   Financial Resource Strain: Not on file  Food Insecurity: No Food Insecurity (06/30/2022)   Hunger Vital Sign    Worried About Running Out of Food in the Last Year: Never true    Ran Out of Food in the Last Year: Never true  Transportation Needs: No  Transportation Needs (06/30/2022)   PRAPARE - Administrator, Civil Service (Medical): No    Lack of Transportation (Non-Medical): No  Physical Activity: Not on file  Stress: Not on file  Social Connections: Not on file  Intimate Partner Violence: Not At Risk (06/30/2022)   Humiliation, Afraid, Rape, and Kick questionnaire    Fear of Current or Ex-Partner: No    Emotionally Abused: No    Physically Abused: No    Sexually Abused: No    FAMILY HISTORY: Family History  Problem Relation Age of Onset   Dementia Mother    Stroke Father    Atrial fibrillation Father    Hypertension Father    Hypertension Paternal Grandfather    Dementia Maternal Grandmother     ALLERGIES:  is allergic to linezolid and vancomycin.  MEDICATIONS:  Current Outpatient Medications  Medication Sig Dispense Refill   acetaminophen (TYLENOL) 325 MG tablet Take 2 tablets (650 mg total) by mouth every 6 (six) hours as needed. 36 tablet 0   acyclovir (ZOVIRAX) 400 MG tablet Take 1 tablet (400 mg total) by mouth 2 (two) times daily. 180 tablet 3   amiodarone (PACERONE) 200 MG tablet Take 1 tablet (200 mg total) by mouth daily. Start after finishing 40 mg twice daily dose of  days 180 tablet 1   apixaban (ELIQUIS) 5 MG TABS tablet Take 1 tablet (5 mg total) by mouth 2 (two) times daily. 60 tablet 11   cholecalciferol (VITAMIN D3) 25 MCG (1000 UNIT) tablet Take 1,000 Units by mouth daily.     Cyanocobalamin (VITAMIN B-12 PO) Take 1,000 mcg by mouth daily.     dexamethasone (DECADRON) 4 MG tablet Take 40 mg by mouth See admin instructions. Take 40 mg by mouth once a day the morning of chemo on Wednesday- or unless otherwise instructed     fluticasone (FLONASE) 50 MCG/ACT nasal spray Place 1 spray into both nostrils daily as needed for allergies or rhinitis.     furosemide (LASIX) 40 MG tablet Take 2 tablets (80 mg total) by mouth daily. 180 tablet 0   gabapentin (NEURONTIN) 300 MG capsule TAKE TWO CAPSULES  BY MOUTH TWICE A DAY 360 capsule 1   hydrALAZINE (APRESOLINE) 50 MG tablet 1 tablet with food Orally three times a day     lisinopril (ZESTRIL) 40 MG tablet TAKE ONE TABLET BY MOUTH ONE TIME DAILY 90 tablet 3   metolazone (ZAROXOLYN) 2.5 MG tablet Take 2.5 mg by mouth every other day. 30 minutes prior to taking your morning Lasix dose only.     Omega-3 Fatty Acids (FISH OIL PO) Take 1 Capful by mouth daily.     pomalidomide (POMALYST) 2 MG capsule Take 1 capsule (2 mg total) by mouth daily. Take one tablet (2 mg  by mouth) daily for 21 days, then take 7 days off 21 capsule 0   potassium chloride SA (KLOR-CON M) 20 MEQ tablet Take 2 tablets (40 mEq total) by mouth daily. Take an extra tablet on the day you take the Metolazone (60 meq total) 180 tablet 3   sodium chloride (OCEAN) 0.65 % SOLN nasal spray Place 1 spray into both nostrils as needed for congestion.     folic acid (FOLVITE) 1 MG tablet Take 1 mg by mouth daily. (Patient not taking: Reported on 09/01/2022)     hydrALAZINE (APRESOLINE) 50 MG tablet Take 1.5 tablets (75 mg total) by mouth 3 (three) times daily. (Patient not taking: Reported on 09/01/2022) 270 tablet 3   No current facility-administered medications for this visit.    REVIEW OF SYSTEMS:   Constitutional: ( - ) fevers, ( - )  chills , ( - ) night sweats Eyes: ( - ) blurriness of vision, ( - ) double vision, ( - ) watery eyes Ears, nose, mouth, throat, and face: ( - ) mucositis, ( - ) sore throat Respiratory: ( - ) cough, ( - ) dyspnea, ( - ) wheezes Cardiovascular: ( - ) palpitation, ( - ) chest discomfort, ( - ) lower extremity swelling Gastrointestinal:  ( - ) nausea, ( - ) heartburn, ( - ) change in bowel habits Skin: ( - ) abnormal skin rashes Lymphatics: ( - ) new lymphadenopathy, ( - ) easy bruising Neurological: ( - ) numbness, ( - ) tingling, ( - ) new weaknesses Behavioral/Psych: ( - ) mood change, ( - ) new changes  All other systems were reviewed with the patient  and are negative.  PHYSICAL EXAMINATION: ECOG PERFORMANCE STATUS: 2 - Symptomatic, <50% confined to bed  Vitals:   09/01/22 1027  BP: (!) 139/51  Pulse: (!) 54  Resp: 16  Temp: 98.2 F (36.8 C)  SpO2: 97%     Filed Weights   09/01/22 1027  Weight: 248 lb (112.5 kg)      GENERAL: Well-appearing elderly Caucasian male, alert, no distress and comfortable SKIN: no overt abnormalities of the skin.  EYES: conjunctiva are pink and non-injected, sclera clear LUNGS: clear to auscultation and percussion with normal breathing effort HEART: tachycardic &  no murmurs. +bilateral lower extremity edema, left greater than right Musculoskeletal: no cyanosis of digits and no clubbing  PSYCH: alert & oriented x 3, fluent speech NEURO: no focal motor/sensory deficits  LABORATORY DATA:  I have reviewed the data as listed    Latest Ref Rng & Units 09/01/2022    9:40 AM 08/25/2022   11:34 AM 08/18/2022   10:35 AM  CBC  WBC 4.0 - 10.5 K/uL 4.5  7.6  7.4   Hemoglobin 13.0 - 17.0 g/dL 16.1  09.6  04.5   Hematocrit 39.0 - 52.0 % 33.1  34.4  33.6   Platelets 150 - 400 K/uL 169  202  194        Latest Ref Rng & Units 09/01/2022    9:40 AM 08/25/2022   11:34 AM 08/18/2022   10:35 AM  CMP  Glucose 70 - 99 mg/dL 409  96  811   BUN 8 - 23 mg/dL 20  28  16    Creatinine 0.61 - 1.24 mg/dL 9.14  7.82  9.56   Sodium 135 - 145 mmol/L 142  142  142   Potassium 3.5 - 5.1 mmol/L 2.9  3.0  3.5   Chloride 98 - 111  mmol/L 102  100  106   CO2 22 - 32 mmol/L 33  35  29   Calcium 8.9 - 10.3 mg/dL 8.5  8.9  8.7   Total Protein 6.5 - 8.1 g/dL 5.8  5.7  5.9   Total Bilirubin 0.3 - 1.2 mg/dL 0.4  0.4  0.4   Alkaline Phos 38 - 126 U/L 80  96  80   AST 15 - 41 U/L 13  11  12    ALT 0 - 44 U/L 20  16  23      Lab Results  Component Value Date   MPROTEIN Not Observed 08/04/2022   MPROTEIN Not Observed 07/28/2022   MPROTEIN Not Observed 06/23/2022   Lab Results  Component Value Date   KPAFRELGTCHN 16.1  08/04/2022   KPAFRELGTCHN 34.9 (H) 07/28/2022   KPAFRELGTCHN 138.1 (H) 06/23/2022   LAMBDASER 3.8 (L) 08/04/2022   LAMBDASER 4.7 (L) 07/28/2022   LAMBDASER 3.4 (L) 06/23/2022   KAPLAMBRATIO 4.24 (H) 08/04/2022   KAPLAMBRATIO 7.43 (H) 07/28/2022   KAPLAMBRATIO 40.62 (H) 06/23/2022     RADIOGRAPHIC STUDIES: VAS [place] LOWER EXTREMITY VENOUS (DVT)  Result Date: 08/18/2022  Lower Venous DVT Study Patient Name:  ARMOUR VILLANUEVA [facility]  Date of Exam:   08/18/2022 Medical Rec #: [id number]         Accession #:    [id number] Date of Birth: 01/05/1951        Patient Gender: M Patient Age:   9 years Exam Location:  [facility] Procedure:      VAS [place] LOWER EXTREMITY VENOUS (DVT) Referring Phys: Georga Kaufmann --------------------------------------------------------------------------------  Indications: Asymmetrical swelling LT>RT, s/p thoracic spine, 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  Anticoagulation: Eliquis. Limitations: Poor ultrasound/tissue interface. Comparison Study: Previous 06/21/22 positive in LLE. Performing Technologist: McKayla Maag RVT, [place]  Examination Guidelines: A complete evaluation includes B-mode imaging, spectral Doppler, color Doppler, and power Doppler as needed of all accessible portions of each vessel. Bilateral testing is considered an integral part of a complete examination. Limited examinations for reoccurring indications may be performed as noted. The reflux portion of the exam is performed with the patient in reverse Trendelenburg.  +---------+---------------+---------+-----------+----------+--------------+ RIGHT    CompressibilityPhasicitySpontaneityPropertiesThrombus Aging +---------+---------------+---------+-----------+----------+--------------+ CFV      Full           Yes      Yes                                 +---------+---------------+---------+-----------+----------+--------------+ SFJ      Full                                                         +---------+---------------+---------+-----------+----------+--------------+ FV Prox  Full                                                        +---------+---------------+---------+-----------+----------+--------------+ FV Mid   Full                                                        +---------+---------------+---------+-----------+----------+--------------+  FV DistalFull                                                        +---------+---------------+---------+-----------+----------+--------------+ PFV      Full                                                        +---------+---------------+---------+-----------+----------+--------------+ POP      Full           Yes      Yes                                 +---------+---------------+---------+-----------+----------+--------------+ PTV      Full                                                        +---------+---------------+---------+-----------+----------+--------------+ PERO     Full                                                        +---------+---------------+---------+-----------+----------+--------------+   +---------+---------------+---------+-----------+----------+--------------+ LEFT     CompressibilityPhasicitySpontaneityPropertiesThrombus Aging +---------+---------------+---------+-----------+----------+--------------+ CFV      Full           Yes      Yes                                 +---------+---------------+---------+-----------+----------+--------------+ SFJ      Full                                                        +---------+---------------+---------+-----------+----------+--------------+ FV Prox  Full                                                        +---------+---------------+---------+-----------+----------+--------------+ FV Mid   Full                                                         +---------+---------------+---------+-----------+----------+--------------+ FV DistalFull                                                        +---------+---------------+---------+-----------+----------+--------------+  PFV      Full                                                        +---------+---------------+---------+-----------+----------+--------------+ POP      Full           Yes      Yes                                 +---------+---------------+---------+-----------+----------+--------------+ PTV      Full                                                        +---------+---------------+---------+-----------+----------+--------------+ PERO     Full                                                        +---------+---------------+---------+-----------+----------+--------------+     Summary: BILATERAL: - No evidence of deep vein thrombosis seen in the lower extremities, bilaterally. - No evidence of superficial venous thrombosis in the lower extremities, bilaterally. -No evidence of popliteal cyst, bilaterally.   *See table(s) above for measurements and observations. Electronically signed by Gerarda Fraction on 08/18/2022 at 10:33:02 PM.    Final    CT Soft Tissue Neck W Contrast  Result Date: 08/02/2022 CLINICAL DATA:  Left ear pain, history of multiple myeloma, recently finished radiation to upper thoracic spine for cancer EXAM: CT NECK WITH CONTRAST TECHNIQUE: Multidetector CT imaging of the neck was performed using the standard protocol following the bolus administration of intravenous contrast. RADIATION DOSE REDUCTION: This exam was performed according to the departmental dose-optimization program which includes automated exposure control, adjustment of the mA and/or kV according to patient size and/or use of iterative reconstruction technique. CONTRAST:  75mL OMNIPAQUE IOHEXOL 300 MG/ML  SOLN COMPARISON:  No prior neck CT, correlation is made with 06/29/2022 CTA  chest FINDINGS: Pharynx and larynx: No acute finding in the nasopharynx or oropharynx. Edema along the posterior aspect of the hypopharynx/proximal larynx (series 5, image 87), with some thickening of the proximal esophagus. No mass is seen. Salivary glands: No inflammation, mass, or stone. Thyroid: Normal. Lymph nodes: None enlarged or abnormal density. Vascular: Patent. Limited intracranial: Negative. Visualized orbits: Negative. Mastoids and visualized paranasal sinuses: Minimal mucosal thickening in the left maxillary sinus. Fluid in left mastoid air cells. Otherwise clear. Skeleton: Status post T1-T4 posterior fusion with T2 corpectomy spacer. No acute fracture in the imaged cervical or thoracic spine, although evaluation is somewhat limited by beam hardening artifact from the patient's hardware. Fluid collection in the soft tissues posterior to the spine and in the paraspinous musculature, which surrounds the spinous processes of C7 and T1. This collection measures up to 3.9 x 6.3 x 6.4 cm (AP x TR x CC) (series 5, image 92 and series 7, image 67). This is adjacent to the postsurgical changes in the subcutaneous tissues and may be postsurgical; this is likely  present and larger on the 06/29/2022 CTA chest, where it is obscured by beam hardening artifact. Upper chest: Debris in the esophagus, which increases the risk of aspiration. Moderate right pleural effusion with associated atelectasis. Other: None. IMPRESSION: 1. Edema along the posterior aspect of the hypopharynx, proximal larynx, and proximal esophagus, favored to represent the sequela of recent radiation therapy. No mass or abscess seen. 2. Fluid collection in the soft tissues posterior to the spine and in the paraspinous musculature, favored to be related to the patient's recent spine surgery and likely present on the prior CTA chest. While this may represent a postoperative seroma, an abscess cannot be excluded. 3. Moderate right pleural effusion  with associated atelectasis. 4. Debris in the esophagus, which increases the risk of aspiration. Electronically Signed   By: Wiliam Ke M.D.   On: 08/02/2022 21:53    ASSESSMENT & PLAN OLNEY MONIER is a 72 y.o. male with medical history significant for IgA Kappa Multiple Myeloma who presents for a follow up visit.   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  He meets diagnostic criteria by having a serum free light chain ratio greater than 100 and lytic lesions causing pathological fracture of the humerus.  Given this I would recommend we pursued Darzalex, Revlimid, and dexamethasone.  Given his advanced age I would not recommend transplantation, that this is something we can consider when it comes time to transition to maintenance therapy versus transplant.  The patient voices understanding of the plan moving forward.  # IgA Kappa Multiple Myeloma --Metastatic survey completed 12/15/2021 --Patient underwent fixation of his humerus on 12/17/2021, pathology confirmed plasmacytoma/plasma cell neoplasm.  --Received 6 cycles of Darzalex, Revlimid, and dexamethasone started on 12/28/2021-05/18/2022.  --Due to worsening myeloma osseous lesions including pathologic fracture at T2 with cord compression, Dr. Leonides Schanz recommends changing therapy to Velcade/Pomalyst/Dex.  Plan: --Due for Cycle 3, Day 1 of Velcade/Pom/Dex today --labs from today showed white blood cell 4.5 hemoglobin 10.7, MCV 99.4 and platelets of 169 --restaging labs from 08/04/2022 with SPEP and SFLC showed M protein not detectable. Kappa decreased to 16.1 (from 34.9), Lambda 3.8, Ratio 4.24 --Pomalyst was dose reduced to 2 mg p.o. moving due to cytopenias. Recommend to continue --RTC in 2 weeks with interval weekly treatments. Patient has an upcoming vacation so we will cancel treatments for 5/15. He will resume on 5/22 as scheduled.    #Palliative Radiation Therapy: # Dysphagia --Received palliative radiation from 01/06/2022-01/19/2022 to right hymerus, right forearm and right chest/rib, 20 Gy in 10 Fx.  --recieved palliative radiation to surgical bed in the upper thoracic spine area on 07/13/2022.  27 Gy in 9 Fx.   # Rash Concerning for Shingles- improving -- Prescribed valacyclovir 3 times daily 1000 mg x 10 days-completed the course -- We will continue to monitor rash closely.  #DVT: --Found to have acute DVT involving left peroneal veins. Likely secondary to recent surgery and hospitalization --Currently on eliquis 5 mg BID.  --Due to worsening lower extremity edema, we will repeat doppler US to rule out repeat DVT.   #Hypokalemia: --Currently on potassium chloride 40 mEq twice daily as prescribed but takes three times a day (60 mEq) on the days he takes Metolazine.  --Potassium level is 2.9 today. Gave 40 mEq of oral potassium chloride today.  --Recommend to take 60 mEq of potassium chloride per day. (20 mEq three times a day).   #  Rash: --Okay to try topical hydrocortisone cream for spot treatment  #Supportive Care -- chemotherapy education complete -- port placement not required -- zofran 8mg  q8H PRN and compazine 10mg  PO q6H for nausea -- acyclovir 400mg  PO BID for VCZ prophylaxis -- Started Zometa q 12 weeks on 05/18/2022. Next dose April 2024.    No orders of the defined types were placed in this encounter.   All questions were answered. The patient knows to call the clinic with any problems, questions or concerns.  A total of more than 30 minutes were spent on this encounter with face-to-face time and non-face-to-face time, including preparing to see the patient, ordering tests and/or medications, counseling the patient and coordination of care as outlined above.   Georga Kaufmann PA-C Dept of Hematology and Oncology [facility] Phone:  [phone]     09/01/2022 8:42 PM

## 2022-09-02 LAB — KAPPA/LAMBDA LIGHT CHAINS
Kappa free light chain: 24.2 mg/L — ABNORMAL HIGH (ref 3.3–19.4)
Kappa, lambda light chain ratio: 4.17 — ABNORMAL HIGH (ref 0.26–1.65)
Lambda free light chains: 5.8 mg/L (ref 5.7–26.3)

## 2022-09-03 DIAGNOSIS — H2513 Age-related nuclear cataract, bilateral: Secondary | ICD-10-CM | POA: Diagnosis not present

## 2022-09-03 DIAGNOSIS — H532 Diplopia: Secondary | ICD-10-CM | POA: Diagnosis not present

## 2022-09-06 DIAGNOSIS — Z7901 Long term (current) use of anticoagulants: Secondary | ICD-10-CM | POA: Diagnosis not present

## 2022-09-06 DIAGNOSIS — I82402 Acute embolism and thrombosis of unspecified deep veins of left lower extremity: Secondary | ICD-10-CM | POA: Diagnosis not present

## 2022-09-06 DIAGNOSIS — Z4789 Encounter for other orthopedic aftercare: Secondary | ICD-10-CM | POA: Diagnosis not present

## 2022-09-06 DIAGNOSIS — Z981 Arthrodesis status: Secondary | ICD-10-CM | POA: Diagnosis not present

## 2022-09-06 DIAGNOSIS — I480 Paroxysmal atrial fibrillation: Secondary | ICD-10-CM | POA: Diagnosis not present

## 2022-09-06 DIAGNOSIS — I110 Hypertensive heart disease with heart failure: Secondary | ICD-10-CM | POA: Diagnosis not present

## 2022-09-06 DIAGNOSIS — C9000 Multiple myeloma not having achieved remission: Secondary | ICD-10-CM | POA: Diagnosis not present

## 2022-09-06 DIAGNOSIS — G629 Polyneuropathy, unspecified: Secondary | ICD-10-CM | POA: Diagnosis not present

## 2022-09-06 DIAGNOSIS — I5033 Acute on chronic diastolic (congestive) heart failure: Secondary | ICD-10-CM | POA: Diagnosis not present

## 2022-09-06 DIAGNOSIS — G4733 Obstructive sleep apnea (adult) (pediatric): Secondary | ICD-10-CM | POA: Diagnosis not present

## 2022-09-06 LAB — MULTIPLE MYELOMA PANEL, SERUM
Albumin SerPl Elph-Mcnc: 3.1 g/dL (ref 2.9–4.4)
Albumin/Glob SerPl: 1.5 (ref 0.7–1.7)
Alpha 1: 0.3 g/dL (ref 0.0–0.4)
Alpha2 Glob SerPl Elph-Mcnc: 0.8 g/dL (ref 0.4–1.0)
B-Globulin SerPl Elph-Mcnc: 0.9 g/dL (ref 0.7–1.3)
Gamma Glob SerPl Elph-Mcnc: 0.2 g/dL — ABNORMAL LOW (ref 0.4–1.8)
Globulin, Total: 2.2 g/dL (ref 2.2–3.9)
IgA: 23 mg/dL — ABNORMAL LOW (ref 61–437)
IgG (Immunoglobin G), Serum: 169 mg/dL — ABNORMAL LOW (ref 603–1613)
IgM (Immunoglobulin M), Srm: 14 mg/dL — ABNORMAL LOW (ref 15–143)
Total Protein ELP: 5.3 g/dL — ABNORMAL LOW (ref 6.0–8.5)

## 2022-09-07 ENCOUNTER — Other Ambulatory Visit: Payer: Self-pay | Admitting: *Deleted

## 2022-09-07 DIAGNOSIS — D472 Monoclonal gammopathy: Secondary | ICD-10-CM

## 2022-09-07 DIAGNOSIS — C9000 Multiple myeloma not having achieved remission: Secondary | ICD-10-CM

## 2022-09-07 NOTE — Progress Notes (Signed)
Dr.McCuen, Opthalmologist called because pt was referred by Dr.Dorsey. She requested labs to be added for blood draw tomorrow due to pt having double vision. Labs were added to 5/8 lab draw.

## 2022-09-08 ENCOUNTER — Other Ambulatory Visit: Payer: Self-pay

## 2022-09-08 ENCOUNTER — Inpatient Hospital Stay: Payer: Medicare Other

## 2022-09-08 ENCOUNTER — Telehealth: Payer: Self-pay | Admitting: *Deleted

## 2022-09-08 ENCOUNTER — Ambulatory Visit (HOSPITAL_COMMUNITY)
Admission: RE | Admit: 2022-09-08 | Discharge: 2022-09-08 | Disposition: A | Discharge status: Home / Self Care | Payer: Medicare Other | Source: Ambulatory Visit | Attending: Physician Assistant | Admitting: Physician Assistant

## 2022-09-08 VITALS — BP 143/77 | HR 53 | Temp 97.8°F | Resp 16

## 2022-09-08 DIAGNOSIS — C9000 Multiple myeloma not having achieved remission: Secondary | ICD-10-CM | POA: Diagnosis not present

## 2022-09-08 DIAGNOSIS — Z515 Encounter for palliative care: Secondary | ICD-10-CM | POA: Diagnosis not present

## 2022-09-08 DIAGNOSIS — I480 Paroxysmal atrial fibrillation: Secondary | ICD-10-CM | POA: Diagnosis not present

## 2022-09-08 DIAGNOSIS — H532 Diplopia: Secondary | ICD-10-CM | POA: Diagnosis not present

## 2022-09-08 DIAGNOSIS — J90 Pleural effusion, not elsewhere classified: Secondary | ICD-10-CM | POA: Diagnosis not present

## 2022-09-08 DIAGNOSIS — M5126 Other intervertebral disc displacement, lumbar region: Secondary | ICD-10-CM | POA: Diagnosis not present

## 2022-09-08 DIAGNOSIS — Z7961 Long term (current) use of immunomodulator: Secondary | ICD-10-CM | POA: Diagnosis not present

## 2022-09-08 DIAGNOSIS — M7989 Other specified soft tissue disorders: Secondary | ICD-10-CM | POA: Diagnosis not present

## 2022-09-08 DIAGNOSIS — E876 Hypokalemia: Secondary | ICD-10-CM | POA: Diagnosis not present

## 2022-09-08 DIAGNOSIS — I4891 Unspecified atrial fibrillation: Secondary | ICD-10-CM | POA: Diagnosis not present

## 2022-09-08 DIAGNOSIS — M533 Sacrococcygeal disorders, not elsewhere classified: Secondary | ICD-10-CM | POA: Diagnosis not present

## 2022-09-08 DIAGNOSIS — Z881 Allergy status to other antibiotic agents status: Secondary | ICD-10-CM | POA: Diagnosis not present

## 2022-09-08 DIAGNOSIS — Z7952 Long term (current) use of systemic steroids: Secondary | ICD-10-CM | POA: Diagnosis not present

## 2022-09-08 DIAGNOSIS — R600 Localized edema: Secondary | ICD-10-CM | POA: Diagnosis not present

## 2022-09-08 DIAGNOSIS — Z823 Family history of stroke: Secondary | ICD-10-CM | POA: Diagnosis not present

## 2022-09-08 DIAGNOSIS — I10 Essential (primary) hypertension: Secondary | ICD-10-CM | POA: Diagnosis not present

## 2022-09-08 DIAGNOSIS — Z86718 Personal history of other venous thrombosis and embolism: Secondary | ICD-10-CM | POA: Diagnosis not present

## 2022-09-08 DIAGNOSIS — Z01818 Encounter for other preprocedural examination: Secondary | ICD-10-CM | POA: Diagnosis not present

## 2022-09-08 DIAGNOSIS — K5900 Constipation, unspecified: Secondary | ICD-10-CM | POA: Diagnosis not present

## 2022-09-08 DIAGNOSIS — Z923 Personal history of irradiation: Secondary | ICD-10-CM | POA: Diagnosis not present

## 2022-09-08 DIAGNOSIS — R1310 Dysphagia, unspecified: Secondary | ICD-10-CM | POA: Diagnosis not present

## 2022-09-08 DIAGNOSIS — Z79899 Other long term (current) drug therapy: Secondary | ICD-10-CM | POA: Diagnosis not present

## 2022-09-08 DIAGNOSIS — D472 Monoclonal gammopathy: Secondary | ICD-10-CM

## 2022-09-08 DIAGNOSIS — Z8249 Family history of ischemic heart disease and other diseases of the circulatory system: Secondary | ICD-10-CM | POA: Diagnosis not present

## 2022-09-08 DIAGNOSIS — Z5112 Encounter for antineoplastic immunotherapy: Secondary | ICD-10-CM | POA: Diagnosis not present

## 2022-09-08 DIAGNOSIS — Z7901 Long term (current) use of anticoagulants: Secondary | ICD-10-CM | POA: Diagnosis not present

## 2022-09-08 LAB — CMP (CANCER CENTER ONLY)
ALT: 23 U/L (ref 0–44)
AST: 16 U/L (ref 15–41)
Albumin: 4 g/dL (ref 3.5–5.0)
Alkaline Phosphatase: 90 U/L (ref 38–126)
Anion gap: 7 (ref 5–15)
BUN: 21 mg/dL (ref 8–23)
CO2: 30 mmol/L (ref 22–32)
Calcium: 8.9 mg/dL (ref 8.9–10.3)
Chloride: 104 mmol/L (ref 98–111)
Creatinine: 0.78 mg/dL (ref 0.61–1.24)
GFR, Estimated: >60 mL/min (ref >60)
Glucose, Bld: 113 mg/dL — ABNORMAL HIGH (ref 70–99)
Potassium: 3.4 mmol/L — ABNORMAL LOW (ref 3.5–5.1)
Sodium: 141 mmol/L (ref 135–145)
Total Bilirubin: 0.4 mg/dL (ref 0.3–1.2)
Total Protein: 6 g/dL — ABNORMAL LOW (ref 6.5–8.1)

## 2022-09-08 LAB — CBC WITH DIFFERENTIAL (CANCER CENTER ONLY)
Abs Immature Granulocytes: 0.03 10*3/uL (ref 0.00–0.07)
Basophils Absolute: 0.1 10*3/uL (ref 0.0–0.1)
Basophils Relative: 2 %
Eosinophils Absolute: 0.1 10*3/uL (ref 0.0–0.5)
Eosinophils Relative: 2 %
HCT: 34.1 % — ABNORMAL LOW (ref 39.0–52.0)
Hemoglobin: 11.2 g/dL — ABNORMAL LOW (ref 13.0–17.0)
Immature Granulocytes: 1 %
Lymphocytes Relative: 9 %
Lymphs Abs: 0.3 10*3/uL — ABNORMAL LOW (ref 0.7–4.0)
MCH: 32.4 pg (ref 26.0–34.0)
MCHC: 32.8 g/dL (ref 30.0–36.0)
MCV: 98.6 fL (ref 80.0–100.0)
Monocytes Absolute: 0.5 10*3/uL (ref 0.1–1.0)
Monocytes Relative: 15 %
Neutro Abs: 2.4 10*3/uL (ref 1.7–7.7)
Neutrophils Relative %: 71 %
Platelet Count: 226 10*3/uL (ref 150–400)
RBC: 3.46 MIL/uL — ABNORMAL LOW (ref 4.22–5.81)
RDW: 16 % — ABNORMAL HIGH (ref 11.5–15.5)
WBC Count: 3.4 10*3/uL — ABNORMAL LOW (ref 4.0–10.5)
nRBC: 0 % (ref 0.0–0.2)

## 2022-09-08 LAB — ECHOCARDIOGRAM COMPLETE
Area-P 1/2: 3.98 cm2
Calc EF: 64.7 %
S' Lateral: 3 cm
Single Plane A2C EF: 64 %
Single Plane A4C EF: 64.1 %

## 2022-09-08 MED ORDER — BORTEZOMIB CHEMO SQ INJECTION 3.5 MG (2.5MG/ML)
1.3000 mg/m2 | Freq: Once | INTRAMUSCULAR | Status: AC
  Administered 2022-09-08: 3 mg via SUBCUTANEOUS
  Filled 2022-09-08: qty 1.2

## 2022-09-08 NOTE — Telephone Encounter (Signed)
Received call from pt regarding getting additional lab  work done requested by pt's ophthalmologist. He has a written prescription for this. Advised that he should bring that prescription with him to his lab appt today, as the lab order needs to be in eye doctor's name. He voiced understanding.

## 2022-09-08 NOTE — Patient Instructions (Signed)
[facility]  Discharge Instructions: Thank you for choosing [facility] to provide your oncology and hematology care.   If you have a lab appointment with the Cancer Center, please go directly to the Cancer Center and check in at the registration area.   Wear comfortable clothing and clothing appropriate for easy access to any Portacath or PICC line.   We strive to give you quality time with your provider. You may need to reschedule your appointment if you arrive late (15 or more minutes).  Arriving late affects you and other patients whose appointments are after yours.  Also, if you miss three or more appointments without notifying the office, you may be dismissed from the clinic at the provider's discretion.      For prescription refill requests, have your pharmacy contact our office and allow 72 hours for refills to be completed.    Today you received the following chemotherapy and/or immunotherapy agents: bortezomib      To help prevent nausea and vomiting after your treatment, we encourage you to take your nausea medication as directed.  BELOW ARE SYMPTOMS THAT SHOULD BE REPORTED IMMEDIATELY: *FEVER GREATER THAN 100.4 F (38 C) OR HIGHER *CHILLS OR SWEATING *NAUSEA AND VOMITING THAT IS NOT CONTROLLED WITH YOUR NAUSEA MEDICATION *UNUSUAL SHORTNESS OF BREATH *UNUSUAL BRUISING OR BLEEDING *URINARY PROBLEMS (pain or burning when urinating, or frequent urination) *BOWEL PROBLEMS (unusual diarrhea, constipation, pain near the anus) TENDERNESS IN MOUTH AND THROAT WITH OR WITHOUT PRESENCE OF ULCERS (sore throat, sores in mouth, or a toothache) UNUSUAL RASH, SWELLING OR PAIN  UNUSUAL VAGINAL DISCHARGE OR ITCHING   Items with * indicate a potential emergency and should be followed up as soon as possible or go to the Emergency Department if any problems should occur.  Please show the CHEMOTHERAPY ALERT CARD or IMMUNOTHERAPY ALERT CARD at  check-in to the Emergency Department and triage nurse.  Should you have questions after your visit or need to cancel or reschedule your appointment, please contact [facility]  Dept: [phone]  and follow the prompts.  Office hours are 8:00 a.m. to 4:30 p.m. Monday - Friday. Please note that voicemails left after 4:00 p.m. may not be returned until the following business day.  We are closed weekends and major holidays. You have access to a nurse at all times for urgent questions. Please call the main number to the clinic Dept: [phone] and follow the prompts.   For any non-urgent questions, you may also contact your provider using MyChart. We now offer e-Visits for anyone 18 and older to request care online for non-urgent symptoms. For details visit [email or web address].   Also download the MyChart app! Go to the app store, search "MyChart", open the app, select [place], and log in with your MyChart username and password.   

## 2022-09-08 NOTE — Progress Notes (Signed)
Echocardiogram 2D Echocardiogram has been performed.  Travis [name] 09/08/2022, 11:48 AM

## 2022-09-09 ENCOUNTER — Other Ambulatory Visit: Payer: Self-pay | Admitting: Ophthalmology

## 2022-09-09 ENCOUNTER — Telehealth: Payer: Self-pay

## 2022-09-09 DIAGNOSIS — H532 Diplopia: Secondary | ICD-10-CM

## 2022-09-09 NOTE — Telephone Encounter (Signed)
-----   Message from Briant Cedar, PA-C sent at 09/09/2022  6:31 AM EDT ----- Please notify patient that echo looks good with normal EF.    ----- Message ----- From: Interface, [employer] One [employer] Sent: 09/08/2022   1:55 PM EDT To: Briant Cedar, PA-C

## 2022-09-09 NOTE — Telephone Encounter (Signed)
Pt advised of ECHO report.  He has some concerns and said he was passing the results on to his cardiologist.

## 2022-09-13 ENCOUNTER — Telehealth: Payer: Self-pay | Admitting: Hematology and Oncology

## 2022-09-15 ENCOUNTER — Other Ambulatory Visit: Payer: Medicare Other

## 2022-09-15 ENCOUNTER — Ambulatory Visit: Payer: Medicare Other | Admitting: Physician Assistant

## 2022-09-15 ENCOUNTER — Ambulatory Visit: Payer: Medicare Other

## 2022-09-17 ENCOUNTER — Other Ambulatory Visit: Payer: Self-pay | Admitting: *Deleted

## 2022-09-17 DIAGNOSIS — C9000 Multiple myeloma not having achieved remission: Secondary | ICD-10-CM

## 2022-09-17 MED ORDER — POMALIDOMIDE 2 MG PO CAPS
2.0000 mg | ORAL_CAPSULE | Freq: Every day | ORAL | 0 refills | Status: DC
Start: 2022-09-17 — End: 2022-10-29

## 2022-09-20 ENCOUNTER — Telehealth: Payer: Self-pay | Admitting: Hematology and Oncology

## 2022-09-22 ENCOUNTER — Inpatient Hospital Stay: Payer: Medicare Other

## 2022-09-22 ENCOUNTER — Other Ambulatory Visit: Payer: Self-pay

## 2022-09-22 VITALS — BP 134/59 | HR 55 | Temp 97.8°F | Resp 16

## 2022-09-22 DIAGNOSIS — C9000 Multiple myeloma not having achieved remission: Secondary | ICD-10-CM

## 2022-09-22 DIAGNOSIS — J90 Pleural effusion, not elsewhere classified: Secondary | ICD-10-CM | POA: Diagnosis not present

## 2022-09-22 DIAGNOSIS — Z5112 Encounter for antineoplastic immunotherapy: Secondary | ICD-10-CM | POA: Diagnosis not present

## 2022-09-22 DIAGNOSIS — K5900 Constipation, unspecified: Secondary | ICD-10-CM | POA: Diagnosis not present

## 2022-09-22 DIAGNOSIS — Z923 Personal history of irradiation: Secondary | ICD-10-CM | POA: Diagnosis not present

## 2022-09-22 DIAGNOSIS — M7989 Other specified soft tissue disorders: Secondary | ICD-10-CM | POA: Diagnosis not present

## 2022-09-22 DIAGNOSIS — M533 Sacrococcygeal disorders, not elsewhere classified: Secondary | ICD-10-CM | POA: Diagnosis not present

## 2022-09-22 DIAGNOSIS — Z86718 Personal history of other venous thrombosis and embolism: Secondary | ICD-10-CM | POA: Diagnosis not present

## 2022-09-22 DIAGNOSIS — R1310 Dysphagia, unspecified: Secondary | ICD-10-CM | POA: Diagnosis not present

## 2022-09-22 DIAGNOSIS — I480 Paroxysmal atrial fibrillation: Secondary | ICD-10-CM | POA: Diagnosis not present

## 2022-09-22 DIAGNOSIS — M5126 Other intervertebral disc displacement, lumbar region: Secondary | ICD-10-CM | POA: Diagnosis not present

## 2022-09-22 DIAGNOSIS — Z8249 Family history of ischemic heart disease and other diseases of the circulatory system: Secondary | ICD-10-CM | POA: Diagnosis not present

## 2022-09-22 DIAGNOSIS — Z7961 Long term (current) use of immunomodulator: Secondary | ICD-10-CM | POA: Diagnosis not present

## 2022-09-22 DIAGNOSIS — E876 Hypokalemia: Secondary | ICD-10-CM | POA: Diagnosis not present

## 2022-09-22 DIAGNOSIS — Z7901 Long term (current) use of anticoagulants: Secondary | ICD-10-CM | POA: Diagnosis not present

## 2022-09-22 DIAGNOSIS — Z881 Allergy status to other antibiotic agents status: Secondary | ICD-10-CM | POA: Diagnosis not present

## 2022-09-22 DIAGNOSIS — Z79899 Other long term (current) drug therapy: Secondary | ICD-10-CM | POA: Diagnosis not present

## 2022-09-22 DIAGNOSIS — Z823 Family history of stroke: Secondary | ICD-10-CM | POA: Diagnosis not present

## 2022-09-22 DIAGNOSIS — M4850XA Collapsed vertebra, not elsewhere classified, site unspecified, initial encounter for fracture: Secondary | ICD-10-CM

## 2022-09-22 DIAGNOSIS — Z515 Encounter for palliative care: Secondary | ICD-10-CM | POA: Diagnosis not present

## 2022-09-22 DIAGNOSIS — Z7952 Long term (current) use of systemic steroids: Secondary | ICD-10-CM | POA: Diagnosis not present

## 2022-09-22 LAB — CBC WITH DIFFERENTIAL (CANCER CENTER ONLY)
Abs Immature Granulocytes: 0.08 10*3/uL — ABNORMAL HIGH (ref 0.00–0.07)
Basophils Absolute: 0.1 10*3/uL (ref 0.0–0.1)
Basophils Relative: 1 %
Eosinophils Absolute: 0.2 10*3/uL (ref 0.0–0.5)
Eosinophils Relative: 3 %
HCT: 34.1 % — ABNORMAL LOW (ref 39.0–52.0)
Hemoglobin: 10.9 g/dL — ABNORMAL LOW (ref 13.0–17.0)
Immature Granulocytes: 1 %
Lymphocytes Relative: 7 %
Lymphs Abs: 0.4 10*3/uL — ABNORMAL LOW (ref 0.7–4.0)
MCH: 32 pg (ref 26.0–34.0)
MCHC: 32 g/dL (ref 30.0–36.0)
MCV: 100 fL (ref 80.0–100.0)
Monocytes Absolute: 0.8 10*3/uL (ref 0.1–1.0)
Monocytes Relative: 13 %
Neutro Abs: 4.7 10*3/uL (ref 1.7–7.7)
Neutrophils Relative %: 75 %
Platelet Count: 205 10*3/uL (ref 150–400)
RBC: 3.41 MIL/uL — ABNORMAL LOW (ref 4.22–5.81)
RDW: 16 % — ABNORMAL HIGH (ref 11.5–15.5)
WBC Count: 6.2 10*3/uL (ref 4.0–10.5)
nRBC: 0 % (ref 0.0–0.2)

## 2022-09-22 LAB — CMP (CANCER CENTER ONLY)
ALT: 21 U/L (ref 0–44)
AST: 14 U/L — ABNORMAL LOW (ref 15–41)
Albumin: 3.7 g/dL (ref 3.5–5.0)
Alkaline Phosphatase: 68 U/L (ref 38–126)
Anion gap: 5 (ref 5–15)
BUN: 14 mg/dL (ref 8–23)
CO2: 26 mmol/L (ref 22–32)
Calcium: 7.3 mg/dL — ABNORMAL LOW (ref 8.9–10.3)
Chloride: 113 mmol/L — ABNORMAL HIGH (ref 98–111)
Creatinine: 0.74 mg/dL (ref 0.61–1.24)
GFR, Estimated: >60 mL/min (ref >60)
Glucose, Bld: 94 mg/dL (ref 70–99)
Potassium: 3.2 mmol/L — ABNORMAL LOW (ref 3.5–5.1)
Sodium: 144 mmol/L (ref 135–145)
Total Bilirubin: 0.4 mg/dL (ref 0.3–1.2)
Total Protein: 5.5 g/dL — ABNORMAL LOW (ref 6.5–8.1)

## 2022-09-22 MED ORDER — BORTEZOMIB CHEMO SQ INJECTION 3.5 MG (2.5MG/ML)
1.3000 mg/m2 | Freq: Once | INTRAMUSCULAR | Status: AC
  Administered 2022-09-22: 3 mg via SUBCUTANEOUS
  Filled 2022-09-22: qty 1.2

## 2022-09-22 NOTE — Patient Instructions (Signed)
[facility]  Discharge Instructions: Thank you for choosing [facility] to provide your oncology and hematology care.   If you have a lab appointment with the Cancer Center, please go directly to the Cancer Center and check in at the registration area.   Wear comfortable clothing and clothing appropriate for easy access to any Portacath or PICC line.   We strive to give you quality time with your provider. You may need to reschedule your appointment if you arrive late (15 or more minutes).  Arriving late affects you and other patients whose appointments are after yours.  Also, if you miss three or more appointments without notifying the office, you may be dismissed from the clinic at the provider's discretion.      For prescription refill requests, have your pharmacy contact our office and allow 72 hours for refills to be completed.    Today you received the following chemotherapy and/or immunotherapy agents: bortezomib      To help prevent nausea and vomiting after your treatment, we encourage you to take your nausea medication as directed.  BELOW ARE SYMPTOMS THAT SHOULD BE REPORTED IMMEDIATELY: *FEVER GREATER THAN 100.4 F (38 C) OR HIGHER *CHILLS OR SWEATING *NAUSEA AND VOMITING THAT IS NOT CONTROLLED WITH YOUR NAUSEA MEDICATION *UNUSUAL SHORTNESS OF BREATH *UNUSUAL BRUISING OR BLEEDING *URINARY PROBLEMS (pain or burning when urinating, or frequent urination) *BOWEL PROBLEMS (unusual diarrhea, constipation, pain near the anus) TENDERNESS IN MOUTH AND THROAT WITH OR WITHOUT PRESENCE OF ULCERS (sore throat, sores in mouth, or a toothache) UNUSUAL RASH, SWELLING OR PAIN  UNUSUAL VAGINAL DISCHARGE OR ITCHING   Items with * indicate a potential emergency and should be followed up as soon as possible or go to the Emergency Department if any problems should occur.  Please show the CHEMOTHERAPY ALERT CARD or IMMUNOTHERAPY ALERT CARD at  check-in to the Emergency Department and triage nurse.  Should you have questions after your visit or need to cancel or reschedule your appointment, please contact [facility]  Dept: [phone]  and follow the prompts.  Office hours are 8:00 a.m. to 4:30 p.m. Monday - Friday. Please note that voicemails left after 4:00 p.m. may not be returned until the following business day.  We are closed weekends and major holidays. You have access to a nurse at all times for urgent questions. Please call the main number to the clinic Dept: [phone] and follow the prompts.   For any non-urgent questions, you may also contact your provider using MyChart. We now offer e-Visits for anyone 18 and older to request care online for non-urgent symptoms. For details visit [email or web address].   Also download the MyChart app! Go to the app store, search "MyChart", open the app, select [facility], and log in with your MyChart username and password.   

## 2022-09-22 NOTE — Therapy (Signed)
OUTPATIENT PHYSICAL THERAPY ONCOLOGY EVALUATION  Patient Name: Travis [name] MRN: [id number] DOB:Mar 08, 1951, 72 y.o., male Today's Date: 09/23/2022  END OF SESSION:  PT End of Session - 09/23/22 1356     Visit Number 1    Number of Visits 16    Date for PT Re-Evaluation 11/04/22    PT Start Time 1400    PT Stop Time 1445    PT Time Calculation (min) 45 min    Activity Tolerance Patient tolerated treatment well    Behavior During Therapy [facility] for tasks assessed/performed             Past Medical History:  Diagnosis Date   A-fib (HCC)    Arthritis    "comes and goes; mostly in hands, occasionally elbow" (04/05/2017)   Complication of anesthesia    "just wanted to sleep alot  for couple days S/P VARICOCELE OR" (04/05/2017)   DVT (deep venous thrombosis) (HCC)    LLE   Dysrhythmia    PVCs    GERD (gastroesophageal reflux disease)    History of hiatal hernia    History of radiation therapy    Thoracic Spine- 07/13/22-07/23/22-Dr. Antony Blackbird   Hypertension    Impaired glucose tolerance    Multiple myeloma (HCC)    Obesity (BMI 30-39.9) 07/26/2016   OSA on CPAP 07/26/2016   Mild with AHI 12.5/hr now on CPAP at 14cm H2O   PAF (paroxysmal atrial fibrillation) (HCC)    s/p PVI Ablation in 2018 // Flecainide Rx // Echo 8/21: EF 60-65, no RWMA, mild LVH, normal RV SF, moderate RVE, mild BAE, trivial MR, mild dilation of aortic root (40 mm)   Pneumonia    "may have had walking pneumonia a few years ago" (04/05/2017)   Pre-diabetes    Thrombophlebitis    Past Surgical History:  Procedure Laterality Date   ATRIAL FIBRILLATION ABLATION  04/05/2017   ATRIAL FIBRILLATION ABLATION N/A 04/05/2017   Procedure: ATRIAL FIBRILLATION ABLATION;  Surgeon: [name], MD;  [facility]: [facility];  Service: Cardiovascular;  Laterality: N/A;   BONE BIOPSY Right 12/17/2021   Procedure: BONE BIOPSY;  Surgeon: [name], MD;  Location: [facility];  Service:  Orthopedics;  Laterality: Right;   COMPLETE RIGHT HIP REPLACMENT  01/19/2019   EVALUATION UNDER ANESTHESIA WITH HEMORRHOIDECTOMY N/A 02/24/2017   Procedure: EXAM UNDER ANESTHESIA WITH  HEMORRHOIDECTOMY;  Surgeon: [name], MD;  Location: [facility];  Service: General;  Laterality: N/A;   EXCISIONAL TOTAL HIP ARTHROPLASTY WITH ANTIBIOTIC SPACERS Right 09/25/2020   Procedure: EXCISIONAL TOTAL HIP ARTHROPLASTY WITH ANTIBIOTIC SPACERS;  Surgeon: [name], MD;  Location: [facility];  Service: Orthopedics;  Laterality: Right;   FLEXIBLE SIGMOIDOSCOPY N/A 04/15/2015   Procedure:  UNSEDATED FLEXIBLE SIGMOIDOSCOPY;  Surgeon: [name], MD;  Location: [facility];  Service: Endoscopy;  Laterality: N/A;   HUMERUS IM NAIL Right 12/17/2021   Procedure: INTRAMEDULLARY (IM) NAIL HUMERAL;  Surgeon: [name], MD;  Location: [facility];  Service: Orthopedics;  Laterality: Right;   KNEE ARTHROSCOPY Left ~ 2016   POSTERIOR CERVICAL FUSION/FORAMINOTOMY N/A 06/04/2022   Procedure: Thoracic one - Thoracic Four Posterior lateral arthrodesis/ fusion and Thoracic two Corpectomy;  Surgeon: [name], MD;  Location: [facility];  Service: Neurosurgery;  Laterality: N/A;   REIMPLANTATION OF TOTAL HIP Right 12/25/2020   Procedure: REIMPLANTATION/REVISION OF RIGHT TOTAL HIP VERSUS REPEAT IRRIGATION AND DEBRIDEMENT;  Surgeon: [name], MD;  Location: [facility];  Service:  Orthopedics;  Laterality: Right;   TESTICLE SURGERY  1988   VARICOCELE   TONSILLECTOMY     VARICOSE VEIN SURGERY Bilateral    Patient Active Problem List   Diagnosis Date Noted   Persistent atrial fibrillation (HCC) 07/15/2022   Acute on chronic diastolic heart failure (HCC) 07/03/2022   Pulmonary infiltrates 07/01/2022   Acute respiratory failure with hypoxemia (HCC) 07/01/2022   Acute hypoxic respiratory failure (HCC) 06/29/2022   Hyponatremia 06/29/2022   History of thoracic surgery 06/29/2022   Acute DVT of left tibial vein  (HCC) 06/21/2022   Hypokalemia 06/21/2022   Vitamin D deficiency 05/30/2022   Other constipation 05/29/2022   Pathologic compression fracture of spine, initial encounter (HCC) 05/29/2022   Cord compression (HCC) 05/28/2022   Degenerative cervical disc 05/14/2022   Hypercoagulable state due to persistent atrial fibrillation (HCC) 01/11/2022   Multiple myeloma (HCC) 12/10/2021   Medication monitoring encounter 02/27/2021   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   Status post peripherally inserted central catheter (PICC) central line placement 11/06/2020   Drug rash with eosinophilia and systemic symptoms syndrome 11/06/2020   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   PAF (paroxysmal atrial fibrillation) (HCC)    GERD (gastroesophageal reflux disease) 03/29/2018   Hiatal hernia 03/29/2018   History of DVT (deep vein thrombosis) 03/29/2018   CAP (community acquired pneumonia) 03/29/2018   Osteoarthritis 03/29/2018   Neuropathy 08/29/2017   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   Paroxysmal atrial fibrillation (HCC) 04/05/2017   Prolapsed internal hemorrhoids, grade 4, s/p ligation/pexy/hemorrhoidectomy x 2 02/24/2017 02/24/2017   OSA (obstructive sleep apnea) 07/26/2016   Obesity (BMI 30-39.9) 07/26/2016   Atrial fibrillation with RVR (HCC)    Hypertension    Chest pain at rest     PCP: Eleanora Neighbor, MD  REFERRING PROVIDER: Antony Blackbird, MD  REFERRING DIAG: Multiple Myeloma  THERAPY DIAG:  Compression fracture of thoracic vertebra, unspecified thoracic vertebral level, initial encounter (HCC)  Multiple myeloma not having achieved remission (HCC)  Muscle weakness (generalized)  Neck pain  Abnormal posture  ONSET DATE: 2023  Rationale for Evaluation and Treatment: Rehabilitation  SUBJECTIVE:                                                                                                                                                                                            SUBJECTIVE STATEMENT:  Muscles between the shoulder blades, back and the neck are sore and tight.  I need to strengthen my right arm after having had surgery. Bending over is difficult. Hard to put on socks and shoes. Had 3 hip replacement surgeries and  that makes bending hard also. I can dress by myself otherwise. Has some neuropathy in left leg maybe from multiple myeloma. He has knee high compression stockings for lower leg swelling and using lasix that helps keep fluid down PERTINENT HISTORY:  [name] 72 y.o. male with medical history significant for IgA Kappa Multiple Myeloma. He was diagnosed with smouldering myeloma in 2018 . Last July he developed right arm pain due to a pathologic fx and he underwent radiation and fixation. In early Feb 2024 he had radiation for pathologic T2 compression fx followed by a T1-T4 fusion. PAIN:  Are you having pain? Yes NPRS scale: 2/10 presently increasing to 8/10 Pain location: neck, upper back muscles Pain orientation: Bilateral  PAIN TYPE: aching, dull, and tight Pain description: constant  Aggravating factors: worse with bending,twisting, trying to lift head Relieving factors: walking is good but gets tired,   PRECAUTIONS: Multiple Myeloma with mets to R ribs, R humerus with ORIF for pathological fracture 12/17/21. Lesions have also been found in R rib and also in his skull. Pathologic fx T2 with fusion T1-4 on 06/04/22, Bilateral LE edema (on Lasix), Afib   WEIGHT BEARING RESTRICTIONS: No  FALLS:  Has patient fallen in last 6 months? Yes. Number of falls 1  LIVING ENVIRONMENT: Lives with: lives with their family and lives with their spouse Lives in: House/apartment Stairs: Yes; Internal: 14 steps; on left going up and External: 3 steps; on right going up Has following equipment at home: Single point cane and Walker - 4 wheeled  OCCUPATION: attorney/ mostly retired?  LEISURE: golf (can't do now.), use to  play racquetball  HAND DOMINANCE: right   PRIOR LEVEL OF FUNCTION: Independent  POSTURE:significant slump sitting posture with forward head  PATIENT GOALS: decrease pain,get stronger,improve balance   OBJECTIVE:  COGNITION: Overall cognitive status: Within functional limits for tasks assessed   PALPATION: Very tender bilateral UT/levator, scapular regions  OBSERVATIONS / OTHER ASSESSMENTS: very flexed head and neck with chin nearly at chest  SENSATION: Light touch: Appears intact    POSTURE: significant forward head, round shoulders, increased kyphosis   Gait holding single point cane in left hand but not using. Right shoulder low, flexed neck;difficulty raising head to see where he is going  UPPER EXTREMITY AROM/PROM:  A/PROM RIGHT   eval   Shoulder extension   Shoulder flexion 91 with compensation  Shoulder abduction 67 with compensation  Shoulder internal rotation   Shoulder external rotation C6    (Blank rows = not tested)  A/PROM LEFT   eval  Shoulder extension   Shoulder flexion 123  Shoulder abduction 112  Shoulder internal rotation T7  Shoulder external rotation t3    (Blank rows = not tested)  CERVICAL AROM: All within normal limits:    Percent limited  Flexion Fully flexed  Extension From flexed position decreased 80%  Right lateral flexion   Left lateral flexion   Right rotation Dec 50% sitting in FH posture  Left rotation        Dec 50%    UPPER EXTREMITY STRENGTH:    LOWER EXTREMITY AROM/PROM:  A/PROM Right eval  Hip flexion   Hip extension   Hip abduction   Hip adduction   Hip internal rotation   Hip external rotation   Knee flexion   Knee extension   Ankle dorsiflexion   Ankle plantarflexion   Ankle inversion   Ankle eversion    (Blank rows = not tested)  A/PROM LEFT eval  Hip flexion   Hip extension   Hip abduction   Hip adduction   Hip internal rotation   Hip external rotation   Knee flexion   Knee extension    Ankle dorsiflexion   Ankle plantarflexion   Ankle inversion   Ankle eversion    (Blank rows = not tested)  LOWER EXTREMITY MMT:   LYMPHEDEMA ASSESSMENTS:   SURGERY TYPE/DATE: 8/17/23Left prox humerus fixation for pathologic fx  2/2/2024Fusion T1-T4 for pathologic fx with cord compression 06/04/22 posterior cervical fusion/foraminotomy;T1-4 fusion NUMBER OF LYMPH NODES REMOVED: NA  CHEMOTHERAPY: Velcade/promalyst/Dex;  RADIATION:to prox humerus and thoracic spine pathologic fx  HORMONE TREATMENT: NA  INFECTIONS: NA  FUNCTIONAL TESTS:    GAIT: Distance walked: 20 Assistive device utilized: carried SPC in left Level of assistance: Complete Independence Comments: feels unsteady, very flexed neck posture   QUICK DASH SURVEY:    TODAY'S TREATMENT:                                                                                                                                         DATE: 09/23/2022  PATIENT EDUCATION:  Education details: sitting posture with lumbar roll Person educated: Patient Education method: Medical illustrator Education comprehension: verbalized understanding and returned demonstration  HOME EXERCISE PROGRAM: Doing HEP from HHPT; cervical ROM, shoulder rolls, shrugs, scap retraction, UT stretches  ASSESSMENT:  CLINICAL IMPRESSION: Patient is a 72 y.o. male who was seen today for physical therapy evaluation and treatment for pain and weakness due to multiple myeloma and subsequent surgeries for pathologic fractures with fixation required in right proximal humerus (8/23) and T1-4.(Feb 2,2024) He would also like to work on balance which was not assessed today. He has significant forward head, round shouldered posture. He demonstrates UT compensation with overhead use of his right UE.[name] He has significant tenderness to palpation of bilateral upper back/scapular muscles likely due to over lengthening of  posterior muscles with poor posture. He  will benefit from skilled therapy to address deficits and return to PLOF  OBJECTIVE IMPAIRMENTS: decreased activity tolerance, decreased balance, difficulty walking, decreased ROM, decreased strength, increased edema, impaired UE functional use, postural dysfunction, and pain.   ACTIVITY LIMITATIONS: carrying, lifting, dressing, reach over head, and locomotion level  PARTICIPATION LIMITATIONS: cleaning, laundry, driving, and occupation  PERSONAL FACTORS:  Multiple Myeloma  are also affecting patient's functional outcome.   REHAB POTENTIAL: Good  CLINICAL DECISION MAKING: Evolving/moderate complexity  EVALUATION COMPLEXITY: Moderate  GOALS: Goals reviewed with patient? Yes  SHORT TERM GOALS: Target date: 10/21/22  Pt will be educated in and independent in proper sitting posture using lumbar roll prn Baseline: Goal status: INITIAL  2.  Pt will report decreased muscle soreness in bilateral upper back x 25% Baseline:  Goal status: INITIAL  3.  Pt will be compliant with a HEP for postural strengthening/ROM Baseline:  Goal status: INITIAL   LONG TERM GOALS:  Target date: 11/04/22  Pt will be able to bring his head to neutral position for safety with ambulation/driving Baseline:  Goal status: INITIAL  2.  Pt will have decreased upper back pain by 50% or more Baseline:  Goal status: INITIAL  3.  Pt will be able to flex right shoulder to 115 degrees with minimal compensation for improved reaching ability Baseline:  Goal status: INITIAL    PT FREQUENCY: 2x/week  PT DURATION: 6 weeks  PLANNED INTERVENTIONS: Therapeutic exercises, Therapeutic activity, Neuromuscular re-education, Balance training, Gait training, Patient/Family education, Self Care, Joint mobilization, Aquatic Therapy, Manual therapy, and Re-evaluation  PLAN FOR NEXT SESSION: pt to be in pool 1x/week and clinic 1x/week, but may start 2x in clinic if pool not yet available. 4 position balance test or berg next  and make goal prn, postural exercises ( and TB), reinforcement of head and neck position and proper sitting posture,UE strength/ avoiding compensation with right, STM upper back/scapular area.   Waynette Buttery, PT 09/23/2022, 4:59 PM 12/17/21

## 2022-09-22 NOTE — Progress Notes (Signed)
Patient took dexamethasone at home today at 1030am

## 2022-09-23 ENCOUNTER — Other Ambulatory Visit: Payer: Self-pay

## 2022-09-23 ENCOUNTER — Ambulatory Visit: Payer: Medicare Other

## 2022-09-23 DIAGNOSIS — Z1152 Encounter for screening for COVID-19: Secondary | ICD-10-CM | POA: Diagnosis not present

## 2022-09-23 DIAGNOSIS — I5033 Acute on chronic diastolic (congestive) heart failure: Secondary | ICD-10-CM | POA: Diagnosis not present

## 2022-09-23 DIAGNOSIS — Z96641 Presence of right artificial hip joint: Secondary | ICD-10-CM | POA: Diagnosis not present

## 2022-09-23 DIAGNOSIS — R293 Abnormal posture: Secondary | ICD-10-CM | POA: Insufficient documentation

## 2022-09-23 DIAGNOSIS — Z79899 Other long term (current) drug therapy: Secondary | ICD-10-CM | POA: Diagnosis not present

## 2022-09-23 DIAGNOSIS — I480 Paroxysmal atrial fibrillation: Secondary | ICD-10-CM | POA: Diagnosis not present

## 2022-09-23 DIAGNOSIS — Z6834 Body mass index (BMI) 34.0-34.9, adult: Secondary | ICD-10-CM | POA: Diagnosis not present

## 2022-09-23 DIAGNOSIS — R918 Other nonspecific abnormal finding of lung field: Secondary | ICD-10-CM | POA: Diagnosis not present

## 2022-09-23 DIAGNOSIS — I10 Essential (primary) hypertension: Secondary | ICD-10-CM | POA: Diagnosis not present

## 2022-09-23 DIAGNOSIS — J80 Acute respiratory distress syndrome: Secondary | ICD-10-CM | POA: Diagnosis not present

## 2022-09-23 DIAGNOSIS — Z872 Personal history of diseases of the skin and subcutaneous tissue: Secondary | ICD-10-CM | POA: Diagnosis not present

## 2022-09-23 DIAGNOSIS — E876 Hypokalemia: Secondary | ICD-10-CM | POA: Diagnosis not present

## 2022-09-23 DIAGNOSIS — L578 Other skin changes due to chronic exposure to nonionizing radiation: Secondary | ICD-10-CM | POA: Diagnosis not present

## 2022-09-23 DIAGNOSIS — Z923 Personal history of irradiation: Secondary | ICD-10-CM | POA: Diagnosis not present

## 2022-09-23 DIAGNOSIS — D225 Melanocytic nevi of trunk: Secondary | ICD-10-CM | POA: Diagnosis not present

## 2022-09-23 DIAGNOSIS — R062 Wheezing: Secondary | ICD-10-CM | POA: Diagnosis not present

## 2022-09-23 DIAGNOSIS — I110 Hypertensive heart disease with heart failure: Secondary | ICD-10-CM | POA: Diagnosis not present

## 2022-09-23 DIAGNOSIS — Z86718 Personal history of other venous thrombosis and embolism: Secondary | ICD-10-CM | POA: Diagnosis not present

## 2022-09-23 DIAGNOSIS — J9601 Acute respiratory failure with hypoxia: Secondary | ICD-10-CM | POA: Diagnosis not present

## 2022-09-23 DIAGNOSIS — R6883 Chills (without fever): Secondary | ICD-10-CM | POA: Diagnosis not present

## 2022-09-23 DIAGNOSIS — R0600 Dyspnea, unspecified: Secondary | ICD-10-CM | POA: Diagnosis not present

## 2022-09-23 DIAGNOSIS — J189 Pneumonia, unspecified organism: Secondary | ICD-10-CM | POA: Diagnosis not present

## 2022-09-23 DIAGNOSIS — R0602 Shortness of breath: Secondary | ICD-10-CM | POA: Diagnosis not present

## 2022-09-23 DIAGNOSIS — Z09 Encounter for follow-up examination after completed treatment for conditions other than malignant neoplasm: Secondary | ICD-10-CM | POA: Diagnosis not present

## 2022-09-23 DIAGNOSIS — J9801 Acute bronchospasm: Secondary | ICD-10-CM | POA: Diagnosis not present

## 2022-09-23 DIAGNOSIS — J90 Pleural effusion, not elsewhere classified: Secondary | ICD-10-CM | POA: Diagnosis not present

## 2022-09-23 DIAGNOSIS — S22000A Wedge compression fracture of unspecified thoracic vertebra, initial encounter for closed fracture: Secondary | ICD-10-CM | POA: Insufficient documentation

## 2022-09-23 DIAGNOSIS — C9000 Multiple myeloma not having achieved remission: Secondary | ICD-10-CM | POA: Insufficient documentation

## 2022-09-23 DIAGNOSIS — R059 Cough, unspecified: Secondary | ICD-10-CM | POA: Diagnosis not present

## 2022-09-23 DIAGNOSIS — K219 Gastro-esophageal reflux disease without esophagitis: Secondary | ICD-10-CM | POA: Diagnosis not present

## 2022-09-23 DIAGNOSIS — J1000 Influenza due to other identified influenza virus with unspecified type of pneumonia: Secondary | ICD-10-CM | POA: Diagnosis present

## 2022-09-23 DIAGNOSIS — M6281 Muscle weakness (generalized): Secondary | ICD-10-CM | POA: Insufficient documentation

## 2022-09-23 DIAGNOSIS — E669 Obesity, unspecified: Secondary | ICD-10-CM | POA: Diagnosis present

## 2022-09-23 DIAGNOSIS — R0689 Other abnormalities of breathing: Secondary | ICD-10-CM | POA: Diagnosis not present

## 2022-09-23 DIAGNOSIS — M542 Cervicalgia: Secondary | ICD-10-CM | POA: Insufficient documentation

## 2022-09-23 DIAGNOSIS — L821 Other seborrheic keratosis: Secondary | ICD-10-CM | POA: Diagnosis not present

## 2022-09-23 DIAGNOSIS — B353 Tinea pedis: Secondary | ICD-10-CM | POA: Diagnosis not present

## 2022-09-23 DIAGNOSIS — J09X2 Influenza due to identified novel influenza A virus with other respiratory manifestations: Secondary | ICD-10-CM | POA: Diagnosis not present

## 2022-09-23 DIAGNOSIS — Z7901 Long term (current) use of anticoagulants: Secondary | ICD-10-CM | POA: Diagnosis not present

## 2022-09-23 DIAGNOSIS — R0902 Hypoxemia: Secondary | ICD-10-CM | POA: Diagnosis not present

## 2022-09-23 DIAGNOSIS — M4850XA Collapsed vertebra, not elsewhere classified, site unspecified, initial encounter for fracture: Secondary | ICD-10-CM | POA: Insufficient documentation

## 2022-09-23 DIAGNOSIS — J111 Influenza due to unidentified influenza virus with other respiratory manifestations: Secondary | ICD-10-CM | POA: Diagnosis present

## 2022-09-23 DIAGNOSIS — K862 Cyst of pancreas: Secondary | ICD-10-CM | POA: Diagnosis not present

## 2022-09-23 DIAGNOSIS — Z823 Family history of stroke: Secondary | ICD-10-CM | POA: Diagnosis not present

## 2022-09-23 DIAGNOSIS — Z8249 Family history of ischemic heart disease and other diseases of the circulatory system: Secondary | ICD-10-CM | POA: Diagnosis not present

## 2022-09-23 DIAGNOSIS — L814 Other melanin hyperpigmentation: Secondary | ICD-10-CM | POA: Diagnosis not present

## 2022-09-23 DIAGNOSIS — G4733 Obstructive sleep apnea (adult) (pediatric): Secondary | ICD-10-CM | POA: Diagnosis not present

## 2022-09-26 ENCOUNTER — Encounter (HOSPITAL_COMMUNITY): Payer: Self-pay

## 2022-09-26 ENCOUNTER — Inpatient Hospital Stay (HOSPITAL_COMMUNITY)
Admission: EM | Admit: 2022-09-26 | Discharge: 2022-10-03 | DRG: 193 | Disposition: A | Discharge status: Home / Self Care | Payer: Medicare Other | Attending: Internal Medicine | Admitting: Internal Medicine

## 2022-09-26 ENCOUNTER — Other Ambulatory Visit: Payer: Self-pay

## 2022-09-26 ENCOUNTER — Emergency Department (HOSPITAL_COMMUNITY): Payer: Medicare Other

## 2022-09-26 DIAGNOSIS — Z96641 Presence of right artificial hip joint: Secondary | ICD-10-CM | POA: Diagnosis present

## 2022-09-26 DIAGNOSIS — Z7901 Long term (current) use of anticoagulants: Secondary | ICD-10-CM | POA: Diagnosis not present

## 2022-09-26 DIAGNOSIS — R062 Wheezing: Secondary | ICD-10-CM | POA: Diagnosis not present

## 2022-09-26 DIAGNOSIS — J09X2 Influenza due to identified novel influenza A virus with other respiratory manifestations: Secondary | ICD-10-CM | POA: Diagnosis present

## 2022-09-26 DIAGNOSIS — R0902 Hypoxemia: Secondary | ICD-10-CM | POA: Diagnosis not present

## 2022-09-26 DIAGNOSIS — R0600 Dyspnea, unspecified: Secondary | ICD-10-CM | POA: Diagnosis not present

## 2022-09-26 DIAGNOSIS — I10 Essential (primary) hypertension: Secondary | ICD-10-CM | POA: Diagnosis not present

## 2022-09-26 DIAGNOSIS — E876 Hypokalemia: Secondary | ICD-10-CM | POA: Diagnosis present

## 2022-09-26 DIAGNOSIS — Z8249 Family history of ischemic heart disease and other diseases of the circulatory system: Secondary | ICD-10-CM

## 2022-09-26 DIAGNOSIS — C9000 Multiple myeloma not having achieved remission: Secondary | ICD-10-CM | POA: Diagnosis present

## 2022-09-26 DIAGNOSIS — Z79899 Other long term (current) drug therapy: Secondary | ICD-10-CM

## 2022-09-26 DIAGNOSIS — J90 Pleural effusion, not elsewhere classified: Secondary | ICD-10-CM | POA: Diagnosis not present

## 2022-09-26 DIAGNOSIS — R0689 Other abnormalities of breathing: Secondary | ICD-10-CM | POA: Diagnosis not present

## 2022-09-26 DIAGNOSIS — R918 Other nonspecific abnormal finding of lung field: Secondary | ICD-10-CM | POA: Diagnosis not present

## 2022-09-26 DIAGNOSIS — Z86718 Personal history of other venous thrombosis and embolism: Secondary | ICD-10-CM

## 2022-09-26 DIAGNOSIS — J9601 Acute respiratory failure with hypoxia: Secondary | ICD-10-CM | POA: Diagnosis present

## 2022-09-26 DIAGNOSIS — Z923 Personal history of irradiation: Secondary | ICD-10-CM

## 2022-09-26 DIAGNOSIS — J111 Influenza due to unidentified influenza virus with other respiratory manifestations: Secondary | ICD-10-CM | POA: Diagnosis present

## 2022-09-26 DIAGNOSIS — G4733 Obstructive sleep apnea (adult) (pediatric): Secondary | ICD-10-CM | POA: Diagnosis present

## 2022-09-26 DIAGNOSIS — Z1152 Encounter for screening for COVID-19: Secondary | ICD-10-CM

## 2022-09-26 DIAGNOSIS — J80 Acute respiratory distress syndrome: Secondary | ICD-10-CM | POA: Diagnosis not present

## 2022-09-26 DIAGNOSIS — I110 Hypertensive heart disease with heart failure: Secondary | ICD-10-CM | POA: Diagnosis present

## 2022-09-26 DIAGNOSIS — J9801 Acute bronchospasm: Secondary | ICD-10-CM | POA: Diagnosis present

## 2022-09-26 DIAGNOSIS — K219 Gastro-esophageal reflux disease without esophagitis: Secondary | ICD-10-CM | POA: Diagnosis present

## 2022-09-26 DIAGNOSIS — J189 Pneumonia, unspecified organism: Secondary | ICD-10-CM | POA: Diagnosis not present

## 2022-09-26 DIAGNOSIS — R6883 Chills (without fever): Secondary | ICD-10-CM | POA: Diagnosis not present

## 2022-09-26 DIAGNOSIS — J1000 Influenza due to other identified influenza virus with unspecified type of pneumonia: Principal | ICD-10-CM | POA: Diagnosis present

## 2022-09-26 DIAGNOSIS — E669 Obesity, unspecified: Secondary | ICD-10-CM | POA: Diagnosis present

## 2022-09-26 DIAGNOSIS — I480 Paroxysmal atrial fibrillation: Secondary | ICD-10-CM | POA: Diagnosis present

## 2022-09-26 DIAGNOSIS — I5033 Acute on chronic diastolic (congestive) heart failure: Secondary | ICD-10-CM | POA: Diagnosis present

## 2022-09-26 DIAGNOSIS — Z823 Family history of stroke: Secondary | ICD-10-CM | POA: Diagnosis not present

## 2022-09-26 DIAGNOSIS — K862 Cyst of pancreas: Secondary | ICD-10-CM | POA: Diagnosis present

## 2022-09-26 DIAGNOSIS — R0602 Shortness of breath: Secondary | ICD-10-CM | POA: Diagnosis not present

## 2022-09-26 DIAGNOSIS — Z6834 Body mass index (BMI) 34.0-34.9, adult: Secondary | ICD-10-CM | POA: Diagnosis not present

## 2022-09-26 DIAGNOSIS — R059 Cough, unspecified: Secondary | ICD-10-CM | POA: Diagnosis not present

## 2022-09-26 LAB — CBC
HCT: 35.4 % — ABNORMAL LOW (ref 39.0–52.0)
Hemoglobin: 11.4 g/dL — ABNORMAL LOW (ref 13.0–17.0)
MCH: 31.7 pg (ref 26.0–34.0)
MCHC: 32.2 g/dL (ref 30.0–36.0)
MCV: 98.3 fL (ref 80.0–100.0)
Platelets: 138 10*3/uL — ABNORMAL LOW (ref 150–400)
RBC: 3.6 MIL/uL — ABNORMAL LOW (ref 4.22–5.81)
RDW: 15.9 % — ABNORMAL HIGH (ref 11.5–15.5)
WBC: 4.2 10*3/uL (ref 4.0–10.5)
nRBC: 0 % (ref 0.0–0.2)

## 2022-09-26 LAB — BASIC METABOLIC PANEL
Anion gap: 9 (ref 5–15)
BUN: 18 mg/dL (ref 8–23)
CO2: 26 mmol/L (ref 22–32)
Calcium: 8.8 mg/dL — ABNORMAL LOW (ref 8.9–10.3)
Chloride: 105 mmol/L (ref 98–111)
Creatinine, Ser: 0.94 mg/dL (ref 0.61–1.24)
GFR, Estimated: >60 mL/min (ref >60)
Glucose, Bld: 121 mg/dL — ABNORMAL HIGH (ref 70–99)
Potassium: 3.9 mmol/L (ref 3.5–5.1)
Sodium: 140 mmol/L (ref 135–145)

## 2022-09-26 LAB — RESP PANEL BY RT-PCR (RSV, FLU A&B, COVID)  RVPGX2
Influenza A by PCR: POSITIVE — AB
Influenza B by PCR: NEGATIVE
Resp Syncytial Virus by PCR: NEGATIVE
SARS Coronavirus 2 by RT PCR: NEGATIVE

## 2022-09-26 MED ORDER — ALBUTEROL SULFATE (2.5 MG/3ML) 0.083% IN NEBU
2.5000 mg | INHALATION_SOLUTION | RESPIRATORY_TRACT | Status: DC | PRN
  Administered 2022-09-30 – 2022-10-01 (×2): 2.5 mg via RESPIRATORY_TRACT
  Filled 2022-09-26 (×2): qty 3

## 2022-09-26 MED ORDER — SODIUM CHLORIDE 0.9 % IV SOLN
1.0000 g | INTRAVENOUS | Status: DC
  Administered 2022-09-27 – 2022-09-29 (×3): 1 g via INTRAVENOUS
  Filled 2022-09-26 (×3): qty 10

## 2022-09-26 MED ORDER — ONDANSETRON HCL 4 MG PO TABS: 4.0000 mg | ORAL_TABLET | Freq: Four times a day (QID) | ORAL | Status: DC | PRN

## 2022-09-26 MED ORDER — SODIUM CHLORIDE 0.9 % IV SOLN
1.0000 g | Freq: Once | INTRAVENOUS | Status: AC
  Administered 2022-09-26: 1 g via INTRAVENOUS
  Filled 2022-09-26: qty 10

## 2022-09-26 MED ORDER — LISINOPRIL 20 MG PO TABS
40.0000 mg | ORAL_TABLET | Freq: Every day | ORAL | Status: DC
  Administered 2022-09-27 – 2022-10-03 (×7): 40 mg via ORAL
  Filled 2022-09-26: qty 2
  Filled 2022-09-26 (×3): qty 4
  Filled 2022-09-26: qty 2
  Filled 2022-09-26 (×2): qty 4

## 2022-09-26 MED ORDER — ACETAMINOPHEN 325 MG PO TABS
650.0000 mg | ORAL_TABLET | Freq: Four times a day (QID) | ORAL | Status: DC | PRN
  Administered 2022-09-27 – 2022-09-30 (×5): 650 mg via ORAL
  Filled 2022-09-26 (×5): qty 2

## 2022-09-26 MED ORDER — AMIODARONE HCL 200 MG PO TABS
200.0000 mg | ORAL_TABLET | Freq: Every day | ORAL | Status: DC
  Administered 2022-09-27 – 2022-10-03 (×7): 200 mg via ORAL
  Filled 2022-09-26 (×7): qty 1

## 2022-09-26 MED ORDER — ACETAMINOPHEN 650 MG RE SUPP: 650.0000 mg | Freq: Four times a day (QID) | RECTAL | Status: DC | PRN

## 2022-09-26 MED ORDER — APIXABAN 5 MG PO TABS
5.0000 mg | ORAL_TABLET | Freq: Two times a day (BID) | ORAL | Status: DC
  Administered 2022-09-26 – 2022-10-03 (×14): 5 mg via ORAL
  Filled 2022-09-26 (×12): qty 1
  Filled 2022-09-26: qty 2
  Filled 2022-09-26: qty 1

## 2022-09-26 MED ORDER — TRAZODONE HCL 50 MG PO TABS
25.0000 mg | ORAL_TABLET | Freq: Every evening | ORAL | Status: DC | PRN
  Filled 2022-09-26: qty 1

## 2022-09-26 MED ORDER — SODIUM CHLORIDE 0.9 % IV SOLN
500.0000 mg | Freq: Once | INTRAVENOUS | Status: AC
  Administered 2022-09-26: 500 mg via INTRAVENOUS
  Filled 2022-09-26: qty 5

## 2022-09-26 MED ORDER — SODIUM CHLORIDE 0.9 % IV SOLN
500.0000 mg | INTRAVENOUS | Status: AC
  Administered 2022-09-27 – 2022-09-30 (×4): 500 mg via INTRAVENOUS
  Filled 2022-09-26 (×4): qty 5

## 2022-09-26 MED ORDER — ONDANSETRON HCL 4 MG/2ML IJ SOLN: 4.0000 mg | Freq: Four times a day (QID) | INTRAMUSCULAR | Status: DC | PRN

## 2022-09-26 MED ORDER — SENNOSIDES-DOCUSATE SODIUM 8.6-50 MG PO TABS: 1.0000 | ORAL_TABLET | Freq: Every evening | ORAL | Status: DC | PRN

## 2022-09-26 MED ORDER — FUROSEMIDE 40 MG PO TABS
80.0000 mg | ORAL_TABLET | Freq: Every day | ORAL | Status: DC
  Administered 2022-09-27 – 2022-09-28 (×2): 80 mg via ORAL
  Filled 2022-09-26 (×2): qty 2

## 2022-09-26 MED ORDER — HYDRALAZINE HCL 50 MG PO TABS
50.0000 mg | ORAL_TABLET | Freq: Three times a day (TID) | ORAL | Status: DC
  Administered 2022-09-26 – 2022-10-03 (×20): 50 mg via ORAL
  Filled 2022-09-26 (×2): qty 2
  Filled 2022-09-26: qty 1
  Filled 2022-09-26 (×3): qty 2
  Filled 2022-09-26: qty 1
  Filled 2022-09-26: qty 2
  Filled 2022-09-26: qty 1
  Filled 2022-09-26 (×6): qty 2
  Filled 2022-09-26: qty 1
  Filled 2022-09-26 (×4): qty 2

## 2022-09-26 MED ORDER — POMALIDOMIDE 2 MG PO CAPS: 2.0000 mg | ORAL_CAPSULE | Freq: Every day | ORAL | Status: DC

## 2022-09-26 MED ORDER — BENZONATATE 100 MG PO CAPS
100.0000 mg | ORAL_CAPSULE | Freq: Three times a day (TID) | ORAL | Status: DC | PRN
  Administered 2022-09-27: 100 mg via ORAL
  Filled 2022-09-26: qty 1

## 2022-09-26 NOTE — ED Triage Notes (Signed)
Patient BIB EMS from home with [facility] since Friday with productive cough and chills

## 2022-09-26 NOTE — ED Provider Notes (Signed)
[place] EMERGENCY DEPARTMENT AT [facility] Provider Note   CSN: [id number] Arrival date & time: 09/26/22  1542     History  Chief Complaint  Patient presents with   Shortness of Breath    Travis [name] is a 72 y.o. male with past medical history significant for hypertension, A-fib, obesity, acid reflux, DVT, community-acquired pneumonia, paroxysmal A-fib, multiple myeloma who is chronically on Eliquis who presents with concern for shortness of breath, cough, chills since Friday.  Patient reports that he discussed back from a cruise, had a stomach virus, but has now developed what feels like an upper respiratory infection versus pneumonia.  No previous history of COPD, patient does not smoke cigarettes.  He received 125 mg Solu-Medrol and 1 DuoNeb and route by EMS.  History and expiratory wheezing improved by time of arrival but still with coarse rhonchi.  HPI     Home Medications Prior to Admission medications   Medication Sig Start Date End Date Taking? Authorizing Provider  acetaminophen (TYLENOL) 325 MG tablet Take 2 tablets (650 mg total) by mouth every 6 (six) hours as needed. 03/15/22   Sloan Leiter, DO  acyclovir (ZOVIRAX) 400 MG tablet Take 1 tablet (400 mg total) by mouth 2 (two) times daily. 12/10/21   Jaci Standard, MD  amiodarone (PACERONE) 200 MG tablet Take 1 tablet (200 mg total) by mouth daily. Start after finishing 40 mg twice daily dose of  days 07/11/22   Lanae Boast, MD  apixaban (ELIQUIS) 5 MG TABS tablet Take 1 tablet (5 mg total) by mouth 2 (two) times daily. 06/29/22   Rhetta Mura, MD  cholecalciferol (VITAMIN D3) 25 MCG (1000 UNIT) tablet Take 1,000 Units by mouth daily.    [provider]  Cyanocobalamin (VITAMIN B-12 PO) Take 1,000 mcg by mouth daily.    [provider]  dexamethasone (DECADRON) 4 MG tablet Take 40 mg by mouth See admin instructions. Take 40 mg by mouth once a day the morning of chemo on  Wednesday- or unless otherwise instructed    [provider]  fluticasone (FLONASE) 50 MCG/ACT nasal spray Place 1 spray into both nostrils daily as needed for allergies or rhinitis.    [provider]  furosemide (LASIX) 40 MG tablet Take 2 tablets (80 mg total) by mouth daily. 08/05/22   Pricilla Riffle, MD  gabapentin (NEURONTIN) 300 MG capsule TAKE TWO CAPSULES BY MOUTH TWICE A DAY 03/29/22   Nita Sickle K, DO  hydrALAZINE (APRESOLINE) 50 MG tablet 1 tablet with food Orally three times a day 07/06/22   [provider]  lisinopril (ZESTRIL) 40 MG tablet TAKE ONE TABLET BY MOUTH ONE TIME DAILY 08/02/22   Pricilla Riffle, MD  metolazone (ZAROXOLYN) 2.5 MG tablet Take 2.5 mg by mouth every other day. 30 minutes prior to taking your morning Lasix dose only. 08/14/22   [provider]  Omega-3 Fatty Acids (FISH OIL PO) Take 1 Capful by mouth daily.    [provider]  pomalidomide (POMALYST) 2 MG capsule Take 1 capsule (2 mg total) by mouth daily. Take one tablet (2 mg by mouth) daily for 21 days, then take 7 days off [employer] [facility] #[id number]  Date obtained 09/17/22 09/17/22   Jaci Standard, MD  potassium chloride SA (KLOR-CON M) 20 MEQ tablet Take 2 tablets (40 mEq total) by mouth daily. Take an extra tablet on the day you take the Metolazone (60 meq total) 08/18/22  Pricilla Riffle, MD  sodium chloride (OCEAN) 0.65 % SOLN nasal spray Place 1 spray into both nostrils as needed for congestion.    [provider]  terbinafine (LAMISIL) 250 MG tablet Take 250 mg by mouth daily.    [provider]      Allergies    Linezolid and Vancomycin    Review of Systems   Review of Systems  Respiratory:  Positive for cough and shortness of breath.   All other systems reviewed and are negative.   Physical Exam Updated Vital Signs BP (!) 151/68   Pulse (!) 55   Temp 98.9 F (37.2 C) (Oral)   Resp 20   Ht 5\' 10"  (1.778 m)   Wt 108.9 kg   SpO2 92%    BMI 34.44 kg/m  Physical Exam Vitals and nursing note reviewed.  Constitutional:      General: He is not in acute distress.    Appearance: Normal appearance.  HENT:     Head: Normocephalic and atraumatic.  Eyes:     General:        Right eye: No discharge.        Left eye: No discharge.  Cardiovascular:     Rate and Rhythm: Normal rate and regular rhythm.     Heart sounds: No murmur heard.    No friction rub. No gallop.  Pulmonary:     Comments: Tachypnea, coarse rhonchi on right.  No wheezing, rhonchi, rales throughout.  Increased effort. Abdominal:     General: Bowel sounds are normal.     Palpations: Abdomen is soft.  Skin:    General: Skin is warm and dry.     Capillary Refill: Capillary refill takes less than 2 seconds.  Neurological:     Mental Status: He is alert and oriented to person, place, and time.  Psychiatric:        Mood and Affect: Mood normal.        Behavior: Behavior normal.     ED Results / Procedures / Treatments   Labs (all labs ordered are listed, but only abnormal results are displayed) Labs Reviewed  RESP PANEL BY RT-PCR (RSV, FLU A&B, COVID)  RVPGX2 - Abnormal; Notable for the following components:      Result Value   Influenza A by PCR POSITIVE (*)    All other components within normal limits  CBC - Abnormal; Notable for the following components:   RBC 3.60 (*)    Hemoglobin 11.4 (*)    HCT 35.4 (*)    RDW 15.9 (*)    Platelets 138 (*)    All other components within normal limits  BASIC METABOLIC PANEL - Abnormal; Notable for the following components:   Glucose, Bld 121 (*)    Calcium 8.8 (*)    All other components within normal limits    EKG None  Radiology DG Chest 2 View  Result Date: 09/26/2022 CLINICAL DATA:  Shortness of breath.  Productive cough and chills. EXAM: CHEST - 2 VIEW COMPARISON:  Chest radiograph dated July 01, 2022 FINDINGS: The heart is enlarged. Bilateral lower lobe hazy opacities concerning for  pneumonia. Differential may also include pulmonary edema. Small bilateral pleural effusions. Advanced osteopenia and posterior spinal fusion at cervicothoracic junction. IMPRESSION: Bilateral lower lobe hazy opacities concerning for pneumonia. Differential may also include pulmonary edema. Small bilateral pleural effusions. Electronically Signed   By: Larose Hires D.O.   On: 09/26/2022 16:52    Procedures Procedures  Medications Ordered in ED Medications  azithromycin (ZITHROMAX) 500 mg in sodium chloride 0.9 % 250 mL IVPB (has no administration in time range)  cefTRIAXone (ROCEPHIN) 1 g in sodium chloride 0.9 % 100 mL IVPB (1 g Intravenous New Bag/Given 09/26/22 1710)    ED Course/ Medical Decision Making/ A&P                             Medical Decision Making Amount and/or Complexity of Data Reviewed Labs: ordered. Radiology: ordered.   This patient is a 72 y.o. male who presents to the ED for concern of shob, this involves an extensive number of treatment options, and is a complaint that carries with it a high risk of complications and morbidity. The emergent differential diagnosis prior to evaluation includes, but is not limited to,  asthma exacerbation, COPD exacerbation, acute upper respiratory infection, acute bronchitis, chronic bronchitis, interstitial lung disease, ARDS, PE, pneumonia, atypical ACS, carbon monoxide poisoning, spontaneous pneumothorax, new CHF vs CHF exacerbation, versus other . This is not an exhaustive differential.   Past Medical History / Co-morbidities / Social History: hypertension, A-fib, obesity, acid reflux, DVT, community-acquired pneumonia, paroxysmal A-fib, multiple myeloma who is chronically on Eliquis  Additional history: Chart reviewed. Pertinent results include: Reviewed lab work, imaging from previous emergency department visits, notably with chest x-ray from his admission back in February for multifocal pneumonia.  Physical Exam: Physical  exam performed. The pertinent findings include: Tachypnea, coarse rhonchi on right.  No wheezing, rhonchi, rales throughout.  Increased effort.  Decreased oxygen saturation, 88-90% on room air  Lab Tests: I ordered, and personally interpreted labs.  The pertinent results include:  He is positive for the flu, CBC notable for mild anemia, hemoglobin 11.4, BMP is overall unremarkable.  Despite no leukocytosis his presentation today does seem consistent with a community-acquired pneumonia secondary to the flu versus significant wheezing, rhonchi, atypical viral pneumonia with acute hypoxic respiratory failure.   Imaging Studies: I ordered imaging studies including plain film chest x-ray. I independently visualized and interpreted imaging which showed bilateral lower lobe haziness concerning for pneumonia. I agree with the radiologist interpretation.   Cardiac Monitoring:  The patient was maintained on a cardiac monitor.  My attending physician Dr. Fredderick Phenix viewed and interpreted the cardiac monitored which showed an underlying rhythm of: NSR, RAD. I agree with this interpretation.   Medications: I ordered medication including Rocephin, azithromycin for community-acquired pneumonia coverage.  He received Solu-Medrol and DuoNeb prior to arrival with significant improvement of the description of biphasic wheezing.  He remains with some rhonchi but is in no acute respiratory distress, he was briefly tachypneic with respirations 24, 27 on arrival.  Consultations Obtained: I requested consultation with the hospitalist, spoke with Dr. Erenest Blank,  and discussed lab and imaging findings as well as pertinent plan - they recommend: admission for CAP with acute hypoxic respiratory failure   Disposition: After consideration of the diagnostic results and the patients response to treatment, I feel that patient would benefit from admission as discussed above.   I discussed this case with my attending physician Dr.  Fredderick Phenix who cosigned this note including patient's presenting symptoms, physical exam, and planned diagnostics and interventions. Attending physician stated agreement with plan or made changes to plan which were implemented.     Final Clinical Impression(s) / ED Diagnoses Final diagnoses:  Community acquired pneumonia, unspecified laterality  Flu  Hypoxia    Rx / DC Orders ED  Discharge Orders     None         Olene Floss, [place] 09/26/22 1746    Rolan Bucco, MD 09/26/22 2202

## 2022-09-26 NOTE — H&P (Signed)
History and Physical  Travis [name] ZOX:[id number] DOB: 12-May-1950 DOA: 09/26/2022  PCP: [name], MD   Chief Complaint: Cough, shortness of breath  HPI: Travis [name] is a 72 y.o. male with medical history significant for hypertension, atrial fibrillation on Eliquis, obesity, acid reflux, multiple myeloma on oral chemotherapy being admitted to the hospital with community-acquired pneumonia in the setting of influenza A.  History provided by the patient as well as his wife who was at the bedside in the ER this afternoon.  They got back about 5 days ago from an [place] cruise, during the cruise they both had some nausea vomiting and cough.  After they got home the following day the patient had scheduled bortezomib infusion which she gets weekly for his multiple myeloma.  Later that day, he started having worsening cough, shortness of breath.  Had some subjective fever and chills.  Cough is nonproductive, patient feels like he is unable to cough up sputum.  His wife was concerned that he might be developing pneumonia, so she called EMS.  He was given 125 mg Solu-Medrol by EMS, placed on nasal cannula oxygen due to oxygen saturation in the high 80s on room air, and was given breathing treatments.  ED Course: In the emergency department he was found to be tachypneic, this has improved.  Initial blood pressure was elevated 189/78 this is also improved.  He is saturating about 94% on 2 L nasal cannula oxygen.  Lab work was pursued, revealed stable hemoglobin 11, but otherwise unremarkable CBC and BMP.  Nasal swab positive for influenza A, and chest x-ray as noted below with possibility of community-acquired pneumonia, he was given empiric IV azithromycin and Rocephin.  Currently he remains on 2 L nasal cannula oxygen, but says that he is feeling much better.  Review of Systems: Please see HPI for pertinent positives and negatives. A complete 10 system review of systems are otherwise  negative.  Past Medical History:  Diagnosis Date   A-fib [facility])    Arthritis    "comes and goes; mostly in hands, occasionally elbow" (04/05/2017)   Complication of anesthesia    "just wanted to sleep alot  for couple days S/P VARICOCELE OR" (04/05/2017)   DVT (deep venous thrombosis) (HCC)    LLE   Dysrhythmia    PVCs    GERD (gastroesophageal reflux disease)    History of hiatal hernia    History of radiation therapy    Thoracic Spine- 07/13/22-07/23/22-Dr. Antony Blackbird   Hypertension    Impaired glucose tolerance    Multiple myeloma (HCC)    Obesity (BMI 30-39.9) 07/26/2016   OSA on CPAP 07/26/2016   Mild with AHI 12.5/hr now on CPAP at 14cm H2O   PAF (paroxysmal atrial fibrillation) (HCC)    s/p PVI Ablation in 2018 // Flecainide Rx // Echo 8/21: EF 60-65, no RWMA, mild LVH, normal RV SF, moderate RVE, mild BAE, trivial MR, mild dilation of aortic root (40 mm)   Pneumonia    "may have had walking pneumonia a few years ago" (04/05/2017)   Pre-diabetes    Thrombophlebitis    Past Surgical History:  Procedure Laterality Date   ATRIAL FIBRILLATION ABLATION  04/05/2017   ATRIAL FIBRILLATION ABLATION N/A 04/05/2017   Procedure: ATRIAL FIBRILLATION ABLATION;  Surgeon: [name], MD;  Location: [facility];  Service: Cardiovascular;  Laterality: N/A;   BONE BIOPSY Right 12/17/2021   Procedure: BONE BIOPSY;  Surgeon: [name], MD;  Location:  [facility];  Service: Orthopedics;  Laterality: Right;   COMPLETE RIGHT HIP REPLACMENT  01/19/2019   EVALUATION UNDER ANESTHESIA WITH HEMORRHOIDECTOMY N/A 02/24/2017   Procedure: EXAM UNDER ANESTHESIA WITH  HEMORRHOIDECTOMY;  Surgeon: [name], MD;  Location: [facility];  Service: General;  Laterality: N/A;   EXCISIONAL TOTAL HIP ARTHROPLASTY WITH ANTIBIOTIC SPACERS Right 09/25/2020   Procedure: EXCISIONAL TOTAL HIP ARTHROPLASTY WITH ANTIBIOTIC SPACERS;  Surgeon: [name], MD;  Location: [facility];  Service:  Orthopedics;  Laterality: Right;   FLEXIBLE SIGMOIDOSCOPY N/A 04/15/2015   Procedure:  UNSEDATED FLEXIBLE SIGMOIDOSCOPY;  Surgeon: [name], MD;  Location: [facility];  Service: Endoscopy;  Laterality: N/A;   HUMERUS IM NAIL Right 12/17/2021   Procedure: INTRAMEDULLARY (IM) NAIL HUMERAL;  Surgeon: [name], MD;  Location: [facility];  Service: Orthopedics;  Laterality: Right;   KNEE ARTHROSCOPY Left ~ 2016   POSTERIOR CERVICAL FUSION/FORAMINOTOMY N/A 06/04/2022   Procedure: Thoracic one - Thoracic Four Posterior lateral arthrodesis/ fusion and Thoracic two Corpectomy;  Surgeon: [name], MD;  Location: [facility];  Service: Neurosurgery;  Laterality: N/A;   REIMPLANTATION OF TOTAL HIP Right 12/25/2020   Procedure: REIMPLANTATION/REVISION OF RIGHT TOTAL HIP VERSUS REPEAT IRRIGATION AND DEBRIDEMENT;  Surgeon: [name], MD;  Location: [facility];  Service: Orthopedics;  Laterality: Right;   TESTICLE SURGERY  1988   VARICOCELE   TONSILLECTOMY     VARICOSE VEIN SURGERY Bilateral     Social History:  reports that he has never smoked. He has never used smokeless tobacco. He reports current alcohol use of about 2.0 standard drinks of alcohol per week. He reports that he does not use drugs.   Allergies  Allergen Reactions   Linezolid Other (See Comments)    Burning tongue    Vancomycin Rash    Other Reaction(s): rash, itching, fever, chills    Family History  Problem Relation Age of Onset   Dementia Mother    Stroke Father    Atrial fibrillation Father    Hypertension Father    Hypertension Paternal Grandfather    Dementia Maternal Grandmother      Prior to Admission medications   Medication Sig Start Date End Date Taking? Authorizing Provider  acetaminophen (TYLENOL) 325 MG tablet Take 2 tablets (650 mg total) by mouth every 6 (six) hours as needed. 03/15/22   Sloan Leiter, DO  acyclovir (ZOVIRAX) 400 MG tablet Take 1 tablet (400 mg total) by mouth 2 (two)  times daily. 12/10/21   Jaci Standard, MD  amiodarone (PACERONE) 200 MG tablet Take 1 tablet (200 mg total) by mouth daily. Start after finishing 40 mg twice daily dose of  days 07/11/22   Lanae Boast, MD  apixaban (ELIQUIS) 5 MG TABS tablet Take 1 tablet (5 mg total) by mouth 2 (two) times daily. 06/29/22   Rhetta Mura, MD  cholecalciferol (VITAMIN D3) 25 MCG (1000 UNIT) tablet Take 1,000 Units by mouth daily.    [provider]  Cyanocobalamin (VITAMIN B-12 PO) Take 1,000 mcg by mouth daily.    [provider]  dexamethasone (DECADRON) 4 MG tablet Take 40 mg by mouth See admin instructions. Take 40 mg by mouth once a day the morning of chemo on Wednesday- or unless otherwise instructed    [provider]  fluticasone (FLONASE) 50 MCG/ACT nasal spray Place 1 spray into both nostrils daily as needed for allergies or rhinitis.    [provider]  furosemide (  LASIX) 40 MG tablet Take 2 tablets (80 mg total) by mouth daily. 08/05/22   Pricilla Riffle, MD  gabapentin (NEURONTIN) 300 MG capsule TAKE TWO CAPSULES BY MOUTH TWICE A DAY 03/29/22   Nita Sickle K, DO  hydrALAZINE (APRESOLINE) 50 MG tablet 1 tablet with food Orally three times a day 07/06/22   [provider]  lisinopril (ZESTRIL) 40 MG tablet TAKE ONE TABLET BY MOUTH ONE TIME DAILY 08/02/22   Pricilla Riffle, MD  metolazone (ZAROXOLYN) 2.5 MG tablet Take 2.5 mg by mouth every other day. 30 minutes prior to taking your morning Lasix dose only. 08/14/22   [provider]  Omega-3 Fatty Acids (FISH OIL PO) Take 1 Capful by mouth daily.    [provider]  pomalidomide (POMALYST) 2 MG capsule Take 1 capsule (2 mg total) by mouth daily. Take one tablet (2 mg by mouth) daily for 21 days, then take 7 days off [employer] #[id number]  Date obtained 09/17/22 09/17/22   Jaci Standard, MD  potassium chloride SA (KLOR-CON M) 20 MEQ tablet Take 2 tablets (40 mEq total) by mouth daily. Take an  extra tablet on the day you take the Metolazone (60 meq total) 08/18/22   Pricilla Riffle, MD  sodium chloride (OCEAN) 0.65 % SOLN nasal spray Place 1 spray into both nostrils as needed for congestion.    [provider]  terbinafine (LAMISIL) 250 MG tablet Take 250 mg by mouth daily.    [provider]    Physical Exam: BP (!) 163/74   Pulse (!) 55   Temp 98.9 F (37.2 C) (Oral)   Resp (!) 21   Ht 5\' 10"  (1.778 m)   Wt 108.9 kg   SpO2 94%   BMI 34.44 kg/m   General:  Alert, oriented, calm, in no acute distress, wearing 2 L nasal cannula oxygen, nontoxic in appearance.  Wife at the bedside. Eyes: EOMI, clear conjuctivae, white sclerea Neck: supple, no masses, trachea mildline  Cardiovascular: RRR, no murmurs or rubs, 1+ pitting edema in the lower extremity, chronic per patient Respiratory: clear to auscultation bilaterally, no wheezes, no crackles  Abdomen: soft, nontender, nondistended, normal bowel tones heard  Skin: dry, no rashes  Musculoskeletal: no joint effusions, normal range of motion  Psychiatric: appropriate affect, normal speech  Neurologic: extraocular muscles intact, clear speech, moving all extremities with intact sensorium          Labs on Admission:  Basic Metabolic Panel: Recent Labs  Lab 09/22/22 1248 09/26/22 1556  NA 144 140  K 3.2* 3.9  CL 113* 105  CO2 26 26  GLUCOSE 94 121*  BUN 14 18  CREATININE 0.74 0.94  CALCIUM 7.3* 8.8*   Liver Function Tests: Recent Labs  Lab 09/22/22 1248  AST 14*  ALT 21  ALKPHOS 68  BILITOT 0.4  PROT 5.5*  ALBUMIN 3.7   No results for input(s): "LIPASE", "AMYLASE" in the last 168 hours. No results for input(s): "AMMONIA" in the last 168 hours. CBC: Recent Labs  Lab 09/22/22 1248 09/26/22 1556  WBC 6.2 4.2  NEUTROABS 4.7  --   HGB 10.9* 11.4*  HCT 34.1* 35.4*  MCV 100.0 98.3  PLT 205 138*   Cardiac Enzymes: No results for input(s): "CKTOTAL", "CKMB", "CKMBINDEX", "TROPONINI" in the  last 168 hours.  BNP (last 3 results) Recent Labs    06/29/22 1512 07/04/22 0321 08/25/22 1150  BNP 785.8* 293.9* 194.6*    ProBNP (last  3 results) Recent Labs    11/17/21 1513  PROBNP 232    CBG: No results for input(s): "GLUCAP" in the last 168 hours.  Radiological Exams on Admission: DG Chest 2 View  Result Date: 09/26/2022 CLINICAL DATA:  Shortness of breath.  Productive cough and chills. EXAM: CHEST - 2 VIEW COMPARISON:  Chest radiograph dated July 01, 2022 FINDINGS: The heart is enlarged. Bilateral lower lobe hazy opacities concerning for pneumonia. Differential may also include pulmonary edema. Small bilateral pleural effusions. Advanced osteopenia and posterior spinal fusion at cervicothoracic junction. IMPRESSION: Bilateral lower lobe hazy opacities concerning for pneumonia. Differential may also include pulmonary edema. Small bilateral pleural effusions. Electronically Signed   By: Larose Hires D.O.   On: 09/26/2022 16:52    Assessment/Plan This is a pleasant 72 year old gentleman with a history of hypertension, paroxysmal atrial fibrillation on Eliquis, on chemotherapy for multiple myeloma being admitted to the hospital with suspected bacterial community-acquired pneumonia superimposed on influenza A.  Community-acquired pneumonia, superimposed on influenza A-hemodynamically stable, not septic -Inpatient admission -Empiric IV azithromycin and Rocephin -No indication for Tamiflu -Albuterol nebulizer as needed for cough/shortness of breath -Tessalon as needed cough  Acute hypoxic respiratory failure-due to community-acquired pneumonia -Supplemental oxygen, to keep O2 saturation 90% or above -Treating pneumonia as above  Lower extremity edema-this is chronic, patient's wife states that he takes his Lasix only intermittently -Will resume daily Lasix 80 mg p.o.  Multiple myeloma-will continue p.o. Pomalyst, use patient's supply  Apnea-continue home CPAP, wife to  bring from home  Hypertension-continue home lisinopril 40 mg p.o. daily, hydralazine 50 mg p.o. 3 times daily  Paroxysmal atrial fibrillation-continue amiodarone, Eliquis  DVT prophylaxis: Eliquis    Code Status: Full Code  Consults called: None  Admission status: The appropriate patient status for this patient is INPATIENT. Inpatient status is judged to be reasonable and necessary in order to provide the required intensity of service to ensure the patient's safety. The patient's presenting symptoms, physical exam findings, and initial radiographic and laboratory data in the context of their chronic comorbidities is felt to place them at high risk for further clinical deterioration. Furthermore, it is not anticipated that the patient will be medically stable for discharge from the hospital within 2 midnights of admission.    I certify that at the point of admission it is my clinical judgment that the patient will require inpatient hospital care spanning beyond 2 midnights from the point of admission due to high intensity of service, high risk for further deterioration and high frequency of surveillance required   Time spent: 68 minutes  Kyisha Fowle Sharlette Dense MD [facility] Pager [phone]  If 7PM-7AM, please contact night-coverage [email or web address] Password [facility]  09/26/2022, 6:04 PM

## 2022-09-26 NOTE — Progress Notes (Signed)
   09/26/22 2218  BiPAP/CPAP/SIPAP  $ Non-Invasive Home Ventilator  Initial  BiPAP/CPAP/SIPAP Pt Type Adult  BiPAP/CPAP/SIPAP Resmed  Mask Type Full face mask  Mask Size Large  Flow Rate 2 lpm  Heater Temperature  (Cord checked with no issues. Plugged in red outlet)  Patient Home Equipment Yes

## 2022-09-27 DIAGNOSIS — J189 Pneumonia, unspecified organism: Secondary | ICD-10-CM

## 2022-09-27 LAB — CBC
HCT: 33.9 % — ABNORMAL LOW (ref 39.0–52.0)
Hemoglobin: 10.9 g/dL — ABNORMAL LOW (ref 13.0–17.0)
MCH: 31.8 pg (ref 26.0–34.0)
MCHC: 32.2 g/dL (ref 30.0–36.0)
MCV: 98.8 fL (ref 80.0–100.0)
Platelets: 115 10*3/uL — ABNORMAL LOW (ref 150–400)
RBC: 3.43 MIL/uL — ABNORMAL LOW (ref 4.22–5.81)
RDW: 15.9 % — ABNORMAL HIGH (ref 11.5–15.5)
WBC: 3.5 10*3/uL — ABNORMAL LOW (ref 4.0–10.5)
nRBC: 0 % (ref 0.0–0.2)

## 2022-09-27 LAB — BASIC METABOLIC PANEL
Anion gap: 7 (ref 5–15)
BUN: 22 mg/dL (ref 8–23)
CO2: 24 mmol/L (ref 22–32)
Calcium: 8.5 mg/dL — ABNORMAL LOW (ref 8.9–10.3)
Chloride: 107 mmol/L (ref 98–111)
Creatinine, Ser: 0.75 mg/dL (ref 0.61–1.24)
GFR, Estimated: >60 mL/min (ref >60)
Glucose, Bld: 160 mg/dL — ABNORMAL HIGH (ref 70–99)
Potassium: 4.1 mmol/L (ref 3.5–5.1)
Sodium: 138 mmol/L (ref 135–145)

## 2022-09-27 MED ORDER — IPRATROPIUM-ALBUTEROL 0.5-2.5 (3) MG/3ML IN SOLN
3.0000 mL | Freq: Four times a day (QID) | RESPIRATORY_TRACT | Status: AC
  Administered 2022-09-27 – 2022-09-28 (×4): 3 mL via RESPIRATORY_TRACT
  Filled 2022-09-27 (×4): qty 3

## 2022-09-27 NOTE — Progress Notes (Signed)
PROGRESS NOTE  Travis [name] ZOX:[id number] DOB: 12/26/1950 DOA: 09/26/2022 PCP: [name], MD  HPI/Recap of past 24 hours: Travis [name] is a 72 y.o. male with medical history significant for hypertension, Afib on Eliquis, obesity, acid reflux, multiple myeloma on oral chemotherapy being admitted to the hospital with SOB, cough X 5 days, found to have community-acquired pneumonia in the setting of influenza A.  He came back from an [place] cruise about 5 days prior to admission.    Today, patient reports feeling better, but still noted to be dyspneic upon ambulation with saturations dropping below 90% on room air.  Patient denies any chest pain, worsening shortness of breath, abdominal pain, nausea/vomiting, fever/chills.    Assessment/Plan: Principal Problem:   CAP (community acquired pneumonia)   Community-acquired pneumonia Influenza A infection Currently afebrile, with no leukocytosis Chest x-ray showed bilateral lower lobe hazy opacities, concerning for pneumonia, could also be pulmonary edema Continue IV azithromycin and Rocephin Continue DuoNebs, cough suppressant No indication for Tamiflu Continue supplemental O2   Acute hypoxic respiratory failure Likely 2/2 above Management as above, supplemental oxygen, to keep O2 saturation 90% or above   Lower extremity edema ?Chronic diastolic HF BNP pending CXR with possible pulmonary edema Vs pneumonia Echo showed EF of 60-65, no regional wall motion abnormality, severe concentric left ventricular hypertrophy with left ventricular diastolic parameters are indeterminate Continue daily Lasix Strict I's and O's  Hypertension Continue home lisinopril, hydralazine 50 mg p.o. 3 times daily   Paroxysmal atrial fibrillation Continue amiodarone, Eliquis   Multiple myeloma Pt insists on taking his p.o. Pomalyst   OSA Continue home CPAP, wife to bring from home   Obesity Lifestyle modification advised     Estimated body mass index is 34.44 kg/m as calculated from the following:   Height as of this encounter: 5\' 10"  (1.778 m).   Weight as of this encounter: 108.9 kg.     Code Status: Full  Family Communication: None at bedside  Disposition Plan: Status is: Inpatient Remains inpatient appropriate because: Level of care      Consultants: None  Procedures: None  Antimicrobials: Ceftriaxone Azithromycin  DVT prophylaxis: Eliquis   Objective: Vitals:   09/27/22 0931 09/27/22 1016 09/27/22 1307 09/27/22 1346  BP:  (!) 136/104  (!) 152/70  Pulse:  (!) 52  (!) 50  Resp:  18  16  Temp:  98 F (36.7 C)  97.6 F (36.4 C)  TempSrc:  Oral  Oral  SpO2: (!) 86% 97% (!) 89% 99%  Weight:      Height:        Intake/Output Summary (Last 24 hours) at 09/27/2022 1443 Last data filed at 09/27/2022 1016 Gross per 24 hour  Intake 830 ml  Output --  Net 830 ml   Filed Weights   09/26/22 1555  Weight: 108.9 kg    Exam: General: NAD  Cardiovascular: S1, S2 present Respiratory: Bibasilar crackles noted Abdomen: Soft, nontender, nondistended, bowel sounds present Musculoskeletal: Trace bilateral pedal edema noted Skin: Normal Psychiatry: Normal mood     Data Reviewed: CBC: Recent Labs  Lab 09/22/22 1248 09/26/22 1556 09/27/22 0412  WBC 6.2 4.2 3.5*  NEUTROABS 4.7  --   --   HGB 10.9* 11.4* 10.9*  HCT 34.1* 35.4* 33.9*  MCV 100.0 98.3 98.8  PLT 205 138* 115*   Basic Metabolic Panel: Recent Labs  Lab 09/22/22 1248 09/26/22 1556 09/27/22 0412  NA 144 140 138  K 3.2* 3.9 4.1  CL 113* 105 107  CO2 26 26 24   GLUCOSE 94 121* 160*  BUN 14 18 22   CREATININE 0.74 0.94 0.75  CALCIUM 7.3* 8.8* 8.5*   GFR: Estimated Creatinine Clearance: 104.7 mL/min (by C-G formula based on SCr of 0.75 mg/dL). Liver Function Tests: Recent Labs  Lab 09/22/22 1248  AST 14*  ALT 21  ALKPHOS 68  BILITOT 0.4  PROT 5.5*  ALBUMIN 3.7   No results for input(s): "LIPASE",  "AMYLASE" in the last 168 hours. No results for input(s): "AMMONIA" in the last 168 hours. Coagulation Profile: No results for input(s): "INR", "PROTIME" in the last 168 hours. Cardiac Enzymes: No results for input(s): "CKTOTAL", "CKMB", "CKMBINDEX", "TROPONINI" in the last 168 hours. BNP (last 3 results) Recent Labs    11/17/21 1513  PROBNP 232   HbA1C: No results for input(s): "HGBA1C" in the last 72 hours. CBG: No results for input(s): "GLUCAP" in the last 168 hours. Lipid Profile: No results for input(s): "CHOL", "HDL", "LDLCALC", "TRIG", "CHOLHDL", "LDLDIRECT" in the last 72 hours. Thyroid Function Tests: No results for input(s): "TSH", "T4TOTAL", "FREET4", "T3FREE", "THYROIDAB" in the last 72 hours. Anemia Panel: No results for input(s): "VITAMINB12", "FOLATE", "FERRITIN", "TIBC", "IRON", "RETICCTPCT" in the last 72 hours. Urine analysis:    Component Value Date/Time   COLORURINE YELLOW 10/13/2020 1355   APPEARANCEUR HAZY (A) 10/13/2020 1355   LABSPEC 1.005 10/13/2020 1355   PHURINE 7.0 10/13/2020 1355   GLUCOSEU NEGATIVE 10/13/2020 1355   HGBUR NEGATIVE 10/13/2020 1355   BILIRUBINUR NEGATIVE 10/13/2020 1355   KETONESUR NEGATIVE 10/13/2020 1355   PROTEINUR NEGATIVE 10/13/2020 1355   NITRITE NEGATIVE 10/13/2020 1355   LEUKOCYTESUR NEGATIVE 10/13/2020 1355   Sepsis Labs: @LABRCNTIP (procalcitonin:4,lacticidven:4)  ) Recent Results (from the past 240 hour(s))  Resp panel by RT-PCR (RSV, Flu A&B, Covid) Anterior Nasal Swab     Status: Abnormal   Collection Time: 09/26/22  3:52 PM   Specimen: Anterior Nasal Swab  Result Value Ref Range Status   SARS Coronavirus 2 by RT PCR NEGATIVE NEGATIVE Final    Comment: (NOTE) SARS-CoV-2 target nucleic acids are NOT DETECTED.  The SARS-CoV-2 RNA is generally detectable in upper respiratory specimens during the acute phase of infection. The lowest concentration of SARS-CoV-2 viral copies this assay can detect is 138 copies/mL.  A negative result does not preclude SARS-Cov-2 infection and should not be used as the sole basis for treatment or other patient management decisions. A negative result may occur with  improper specimen collection/handling, submission of specimen other than nasopharyngeal swab, presence of viral mutation(s) within the areas targeted by this assay, and inadequate number of viral copies(<138 copies/mL). A negative result must be combined with clinical observations, patient history, and epidemiological information. The expected result is Negative.  Fact Sheet for Patients:  BloggerCourse.com  Fact Sheet for Healthcare Providers:  SeriousBroker.it  This test is no t yet approved or cleared by the [place] FDA and  has been authorized for detection and/or diagnosis of SARS-CoV-2 by FDA under an Emergency Use Authorization (EUA). This EUA will remain  in effect (meaning this test can be used) for the duration of the COVID-19 declaration under Section 564(b)(1) of the Act, 21 U.S.C.section 360bbb-3(b)(1), unless the authorization is terminated  or revoked sooner.       Influenza A by PCR POSITIVE (A) NEGATIVE Final   Influenza B by PCR NEGATIVE NEGATIVE Final    Comment: (NOTE) The Xpert Xpress SARS-CoV-2/FLU/RSV plus assay is intended as an aid  in the diagnosis of influenza from Nasopharyngeal swab specimens and should not be used as a sole basis for treatment. Nasal washings and aspirates are unacceptable for Xpert Xpress SARS-CoV-2/FLU/RSV testing.  Fact Sheet for Patients: BloggerCourse.com  Fact Sheet for Healthcare Providers: SeriousBroker.it  This test is not yet approved or cleared by the [place] FDA and has been authorized for detection and/or diagnosis of SARS-CoV-2 by FDA under an Emergency Use Authorization (EUA). This EUA will remain in effect (meaning this  test can be used) for the duration of the COVID-19 declaration under Section 564(b)(1) of the Act, 21 U.S.C. section 360bbb-3(b)(1), unless the authorization is terminated or revoked.     Resp Syncytial Virus by PCR NEGATIVE NEGATIVE Final    Comment: (NOTE) Fact Sheet for Patients: BloggerCourse.com  Fact Sheet for Healthcare Providers: SeriousBroker.it  This test is not yet approved or cleared by the [place] FDA and has been authorized for detection and/or diagnosis of SARS-CoV-2 by FDA under an Emergency Use Authorization (EUA). This EUA will remain in effect (meaning this test can be used) for the duration of the COVID-19 declaration under Section 564(b)(1) of the Act, 21 U.S.C. section 360bbb-3(b)(1), unless the authorization is terminated or revoked.  Performed at [facility], [address] [place], [place] [address]       Studies: DG Chest 2 View  Result Date: 09/26/2022 CLINICAL DATA:  Shortness of breath.  Productive cough and chills. EXAM: CHEST - 2 VIEW COMPARISON:  Chest radiograph dated July 01, 2022 FINDINGS: The heart is enlarged. Bilateral lower lobe hazy opacities concerning for pneumonia. Differential may also include pulmonary edema. Small bilateral pleural effusions. Advanced osteopenia and posterior spinal fusion at cervicothoracic junction. IMPRESSION: Bilateral lower lobe hazy opacities concerning for pneumonia. Differential may also include pulmonary edema. Small bilateral pleural effusions. Electronically Signed   By: Larose Hires D.O.   On: 09/26/2022 16:52    Scheduled Meds:  amiodarone  200 mg Oral Daily   apixaban  5 mg Oral BID   furosemide  80 mg Oral Daily   hydrALAZINE  50 mg Oral TID   lisinopril  40 mg Oral Daily    Continuous Infusions:  azithromycin     cefTRIAXone (ROCEPHIN)  IV       LOS: 1 day     Briant Cedar, MD [facility]  If  7PM-7AM, please contact night-coverage [email or web address] 09/27/2022, 2:43 PM

## 2022-09-27 NOTE — Progress Notes (Signed)
Mobility Specialist - Progress Note   09/27/22 1307  Oxygen Therapy  SpO2 (!) 89 %  O2 Device Room Air  Patient Activity (if Appropriate) Ambulating  Mobility  Activity Ambulated with assistance in hallway  Level of Assistance Standby assist, set-up cues, supervision of patient - no hands on  Assistive Device Front wheel walker  Distance Ambulated (ft) 275 ft  Activity Response Tolerated well  Mobility Referral Yes  $Mobility charge 1 Mobility  Mobility Specialist Start Time (ACUTE ONLY) 1256  Mobility Specialist Stop Time (ACUTE ONLY) 1306  Mobility Specialist Time Calculation (min) (ACUTE ONLY) 10 min   Pt received in recliner and agreeable to mobility. When returning to room pt desat to 86%. Put [place] on & encouraged pursed lip breaths allowing O2 to come back up to 88%. No complaints during session. Pt to recliner after session with all needs met.    Pre-mobility:  96% SpO2 (RA) During mobility: 89% SpO2 (RA) Post-mobility: 86%-88% SPO2 (1L [place])  Chief Technology Officer

## 2022-09-27 NOTE — Progress Notes (Signed)
Mobility Specialist - Progress Note   09/27/22 0931  Oxygen Therapy  SpO2 (!) 86 %  O2 Device Room Air  Patient Activity (if Appropriate) Ambulating  Mobility  Activity Ambulated with assistance in hallway  Level of Assistance Standby assist, set-up cues, supervision of patient - no hands on  Assistive Device Front wheel walker  Distance Ambulated (ft) 275 ft  Activity Response Tolerated well  Mobility Referral Yes  $Mobility charge 1 Mobility  Mobility Specialist Start Time (ACUTE ONLY) 0920  Mobility Specialist Stop Time (ACUTE ONLY) 0929  Mobility Specialist Time Calculation (min) (ACUTE ONLY) 9 min   Pt received in room standing and agreeable to mobility. When returning to room pt c/o feeling SOB. O2 check & at 86%. When back in room, put [place] back on 1L & encouraged pursed lip breaths allowing O2 to come back up to 90%. Nurse made aware of occurrences. No other complaints during session. Pt to recliner after session with all needs met.     During mobility: 86% SpO2 (RA) Post-mobility: 90% SPO2 (1L [place])  Chief Technology Officer

## 2022-09-28 ENCOUNTER — Inpatient Hospital Stay (HOSPITAL_COMMUNITY): Payer: Medicare Other

## 2022-09-28 DIAGNOSIS — J189 Pneumonia, unspecified organism: Secondary | ICD-10-CM | POA: Diagnosis not present

## 2022-09-28 LAB — BASIC METABOLIC PANEL
Anion gap: 7 (ref 5–15)
BUN: 26 mg/dL — ABNORMAL HIGH (ref 8–23)
CO2: 24 mmol/L (ref 22–32)
Calcium: 8 mg/dL — ABNORMAL LOW (ref 8.9–10.3)
Chloride: 106 mmol/L (ref 98–111)
Creatinine, Ser: 0.83 mg/dL (ref 0.61–1.24)
GFR, Estimated: >60 mL/min (ref >60)
Glucose, Bld: 108 mg/dL — ABNORMAL HIGH (ref 70–99)
Potassium: 3.4 mmol/L — ABNORMAL LOW (ref 3.5–5.1)
Sodium: 137 mmol/L (ref 135–145)

## 2022-09-28 LAB — CBC WITH DIFFERENTIAL/PLATELET
Abs Immature Granulocytes: 0.02 10*3/uL (ref 0.00–0.07)
Basophils Absolute: 0 10*3/uL (ref 0.0–0.1)
Basophils Relative: 1 %
Eosinophils Absolute: 0 10*3/uL (ref 0.0–0.5)
Eosinophils Relative: 0 %
HCT: 30.4 % — ABNORMAL LOW (ref 39.0–52.0)
Hemoglobin: 9.7 g/dL — ABNORMAL LOW (ref 13.0–17.0)
Immature Granulocytes: 1 %
Lymphocytes Relative: 9 %
Lymphs Abs: 0.2 10*3/uL — ABNORMAL LOW (ref 0.7–4.0)
MCH: 31.3 pg (ref 26.0–34.0)
MCHC: 31.9 g/dL (ref 30.0–36.0)
MCV: 98.1 fL (ref 80.0–100.0)
Monocytes Absolute: 0.4 10*3/uL (ref 0.1–1.0)
Monocytes Relative: 15 %
Neutro Abs: 2 10*3/uL (ref 1.7–7.7)
Neutrophils Relative %: 74 %
Platelets: 113 10*3/uL — ABNORMAL LOW (ref 150–400)
RBC: 3.1 MIL/uL — ABNORMAL LOW (ref 4.22–5.81)
RDW: 16.1 % — ABNORMAL HIGH (ref 11.5–15.5)
WBC: 2.7 10*3/uL — ABNORMAL LOW (ref 4.0–10.5)
nRBC: 0 % (ref 0.0–0.2)

## 2022-09-28 LAB — BRAIN NATRIURETIC PEPTIDE: B Natriuretic Peptide: 382.8 pg/mL — ABNORMAL HIGH (ref 0.0–100.0)

## 2022-09-28 MED ORDER — HYDROCODONE-ACETAMINOPHEN 5-325 MG PO TABS
1.0000 | ORAL_TABLET | Freq: Once | ORAL | Status: AC | PRN
  Administered 2022-09-28: 1 via ORAL
  Filled 2022-09-28: qty 1

## 2022-09-28 MED ORDER — FUROSEMIDE 10 MG/ML IJ SOLN
40.0000 mg | Freq: Two times a day (BID) | INTRAMUSCULAR | Status: DC
  Administered 2022-09-28 – 2022-09-30 (×4): 40 mg via INTRAVENOUS
  Filled 2022-09-28 (×4): qty 4

## 2022-09-28 MED ORDER — GABAPENTIN 300 MG PO CAPS
600.0000 mg | ORAL_CAPSULE | Freq: Two times a day (BID) | ORAL | Status: DC
  Administered 2022-09-28 – 2022-10-03 (×11): 600 mg via ORAL
  Filled 2022-09-28 (×11): qty 2

## 2022-09-28 NOTE — Progress Notes (Signed)
   09/28/22 2136  BiPAP/CPAP/SIPAP  BiPAP/CPAP/SIPAP Pt Type Adult  BiPAP/CPAP/SIPAP Resmed  Mask Type Full face mask  Respiratory Rate 20 breaths/min  EPAP 9.8 cmH2O  Flow Rate 2 lpm  Heater Temperature  (sterile water added to humidifier)  Patient Home Equipment Yes (no frays on cord or obvious defects noted, machine plugged into red outlet)  Auto Titrate No  BiPAP/CPAP /SiPAP Vitals  Pulse Rate (!) 57  Resp 20  SpO2 93 %  MEWS Score/Color  MEWS Score 0  MEWS Score Color [place]

## 2022-09-28 NOTE — Progress Notes (Signed)
Mobility Specialist - Progress Note   09/28/22 1303  Oxygen Therapy  SpO2 (!) 88 %  O2 Device Room Air  Patient Activity (if Appropriate) Ambulating  Mobility  Activity Ambulated with assistance in hallway;Ambulated with assistance to bathroom  Level of Assistance Modified independent, requires aide device or extra time  Assistive Device Front wheel walker  Distance Ambulated (ft) 275 ft  Activity Response Tolerated well  Mobility Referral Yes  $Mobility charge 1 Mobility  Mobility Specialist Start Time (ACUTE ONLY) 1255  Mobility Specialist Stop Time (ACUTE ONLY) 1302  Mobility Specialist Time Calculation (min) (ACUTE ONLY) 7 min   Pt received in recliner and agreeable to mobility. During ambulation pt desat to 88% several ties requiring 3x standing rest breaks due to SOB. No complaints during session.  Pt to bathroom after session with all needs met.    Pre-mobility: 90%  SpO2 During mobility: 88% SpO2   Chief Technology Officer

## 2022-09-28 NOTE — TOC Progression Note (Signed)
Transition of Care (TOC) - Progression Note   Transition of Care [facility]) - Inpatient Brief Assessment  Patient Details  Name: Travis [name] MRN: [id number] Date of Birth: Oct 24, 1950  [facility] CM/SW Contact:    Ewing Schlein, LCSW Phone Number: 09/28/2022, 1:44 PM  Transition of Care Asessment: Insurance and Status: Insurance coverage has been reviewed Patient has primary care physician: Yes Home environment has been reviewed: Will return home with spouse Prior level of function:: Independent at baseline Prior/Current Home Services: No current home services Social Determinants of Health Reivew: SDOH reviewed no interventions necessary Readmission risk has been reviewed: Yes Transition of care needs: no transition of care needs at this time  Barriers to Discharge: Continued Medical Work up  Social Determinants of Health (SDOH) Interventions SDOH Screenings   Food Insecurity: No Food Insecurity (09/27/2022)  Housing: Low Risk  (09/27/2022)  Transportation Needs: No Transportation Needs (09/27/2022)  Utilities: Not At Risk (09/27/2022)  Depression (PHQ2-9): Low Risk  (10/16/2020)  Tobacco Use: Low Risk  (09/26/2022)   Readmission Risk Interventions    09/27/2022   10:17 AM 06/09/2022   11:52 AM  Readmission Risk Prevention Plan  Transportation Screening Complete Complete  PCP or Specialist Appt within 5-7 Days  Complete  Home Care Screening  Complete  Medication Review (RN CM)  Complete  HRI or Home Care Consult Complete   Social Work Consult for Recovery Care Planning/Counseling Complete   Palliative Care Screening Not Applicable   Medication Review Oceanographer) Complete   HRI or Home Care Consult Complete   SW Recovery Care/Counseling Consult Complete   Palliative Care Screening Not Applicable   Skilled Nursing Facility Not Applicable

## 2022-09-28 NOTE — Progress Notes (Addendum)
PROGRESS NOTE  Travis [name] ZOX:[id number] DOB: [birth year] DOA: 09/26/2022 PCP: [name], MD  HPI/Recap of past 24 hours: Travis [name] is a 72 y.o. male with medical history significant for hypertension, Afib on Eliquis, obesity, acid reflux, multiple myeloma on oral chemotherapy being admitted to the hospital with SOB, cough X 5 days, found to have community-acquired pneumonia in the setting of influenza A.  He came back from an [place] cruise about 5 days prior to admission.    Today, patient reports not feeling well overall, complains of chronic back pain.     Assessment/Plan: Principal Problem:   CAP (community acquired pneumonia)   Community-acquired pneumonia Influenza A infection Currently afebrile, with no leukocytosis Chest x-ray showed bilateral lower lobe hazy opacities, concerning for pneumonia, could also be pulmonary edema, repeat chest x-ray unchanged Continue IV azithromycin and Rocephin, if deteriorates, may switch to cefepime Continue DuoNebs, cough suppressant No indication for Tamiflu Continue supplemental O2   Acute hypoxic respiratory failure Likely 2/2 above Management as above, supplemental oxygen, to keep O2 saturation 90% or above   Lower extremity edema ?Acute on chronic diastolic HF BNP 382 CXR with possible pulmonary edema Vs pneumonia Echo showed EF of 60-65, no regional wall motion abnormality, severe concentric left ventricular hypertrophy with left ventricular diastolic parameters are indeterminate Switch to IV Lasix Strict I's and O's  Hypokalemia Replace as needed  Hypertension Continue home lisinopril, hydralazine 50 mg p.o. 3 times daily   Paroxysmal atrial fibrillation Continue amiodarone, Eliquis   Multiple myeloma Hold p.o. Pomalyst   OSA Continue home CPAP   Obesity Lifestyle modification advised    Estimated body mass index is 34.44 kg/m as calculated from the following:   Height as of this  encounter: 5\' 10"  (1.778 m).   Weight as of this encounter: 108.9 kg.     Code Status: Full  Family Communication: None at bedside  Disposition Plan: Status is: Inpatient Remains inpatient appropriate because: Level of care      Consultants: None  Procedures: None  Antimicrobials: Ceftriaxone Azithromycin  DVT prophylaxis: Eliquis   Objective: Vitals:   09/28/22 0806 09/28/22 0931 09/28/22 1303 09/28/22 1407  BP:  (!) 150/62  (!) 136/57  Pulse:  64  (!) 101  Resp:  16  16  Temp:  98.5 F (36.9 C)  98.2 F (36.8 C)  TempSrc:  Oral  Oral  SpO2: 95% 97% (!) 88% 97%  Weight:      Height:        Intake/Output Summary (Last 24 hours) at 09/28/2022 1534 Last data filed at 09/28/2022 1500 Gross per 24 hour  Intake 1429.91 ml  Output --  Net 1429.91 ml   Filed Weights   09/26/22 1555  Weight: 108.9 kg    Exam: General: NAD  Cardiovascular: S1, S2 present Respiratory: Bibasilar crackles noted Abdomen: Soft, nontender, nondistended, bowel sounds present Musculoskeletal: Trace bilateral pedal edema noted Skin: Normal Psychiatry: Normal mood     Data Reviewed: CBC: Recent Labs  Lab 09/22/22 1248 09/26/22 1556 09/27/22 0412 09/28/22 0452  WBC 6.2 4.2 3.5* 2.7*  NEUTROABS 4.7  --   --  2.0  HGB 10.9* 11.4* 10.9* 9.7*  HCT 34.1* 35.4* 33.9* 30.4*  MCV 100.0 98.3 98.8 98.1  PLT 205 138* 115* 113*   Basic Metabolic Panel: Recent Labs  Lab 09/22/22 1248 09/26/22 1556 09/27/22 0412 09/28/22 0452  NA 144 140 138 137  K 3.2* 3.9 4.1 3.4*  CL 113*  105 107 106  CO2 26 26 24 24   GLUCOSE 94 121* 160* 108*  BUN 14 18 22  26*  CREATININE 0.74 0.94 0.75 0.83  CALCIUM 7.3* 8.8* 8.5* 8.0*   GFR: Estimated Creatinine Clearance: 100.9 mL/min (by C-G formula based on SCr of 0.83 mg/dL). Liver Function Tests: Recent Labs  Lab 09/22/22 1248  AST 14*  ALT 21  ALKPHOS 68  BILITOT 0.4  PROT 5.5*  ALBUMIN 3.7   No results for input(s): "LIPASE",  "AMYLASE" in the last 168 hours. No results for input(s): "AMMONIA" in the last 168 hours. Coagulation Profile: No results for input(s): "INR", "PROTIME" in the last 168 hours. Cardiac Enzymes: No results for input(s): "CKTOTAL", "CKMB", "CKMBINDEX", "TROPONINI" in the last 168 hours. BNP (last 3 results) Recent Labs    11/17/21 1513  PROBNP 232   HbA1C: No results for input(s): "HGBA1C" in the last 72 hours. CBG: No results for input(s): "GLUCAP" in the last 168 hours. Lipid Profile: No results for input(s): "CHOL", "HDL", "LDLCALC", "TRIG", "CHOLHDL", "LDLDIRECT" in the last 72 hours. Thyroid Function Tests: No results for input(s): "TSH", "T4TOTAL", "FREET4", "T3FREE", "THYROIDAB" in the last 72 hours. Anemia Panel: No results for input(s): "VITAMINB12", "FOLATE", "FERRITIN", "TIBC", "IRON", "RETICCTPCT" in the last 72 hours. Urine analysis:    Component Value Date/Time   COLORURINE YELLOW 10/13/2020 1355   APPEARANCEUR HAZY (A) 10/13/2020 1355   LABSPEC 1.005 10/13/2020 1355   PHURINE 7.0 10/13/2020 1355   GLUCOSEU NEGATIVE 10/13/2020 1355   HGBUR NEGATIVE 10/13/2020 1355   BILIRUBINUR NEGATIVE 10/13/2020 1355   KETONESUR NEGATIVE 10/13/2020 1355   PROTEINUR NEGATIVE 10/13/2020 1355   NITRITE NEGATIVE 10/13/2020 1355   LEUKOCYTESUR NEGATIVE 10/13/2020 1355   Sepsis Labs: @LABRCNTIP (procalcitonin:4,lacticidven:4)  ) Recent Results (from the past 240 hour(s))  Resp panel by RT-PCR (RSV, Flu A&B, Covid) Anterior Nasal Swab     Status: Abnormal   Collection Time: 09/26/22  3:52 PM   Specimen: Anterior Nasal Swab  Result Value Ref Range Status   SARS Coronavirus 2 by RT PCR NEGATIVE NEGATIVE Final    Comment: (NOTE) SARS-CoV-2 target nucleic acids are NOT DETECTED.  The SARS-CoV-2 RNA is generally detectable in upper respiratory specimens during the acute phase of infection. The lowest concentration of SARS-CoV-2 viral copies this assay can detect is 138 copies/mL.  A negative result does not preclude SARS-Cov-2 infection and should not be used as the sole basis for treatment or other patient management decisions. A negative result may occur with  improper specimen collection/handling, submission of specimen other than nasopharyngeal swab, presence of viral mutation(s) within the areas targeted by this assay, and inadequate number of viral copies(<138 copies/mL). A negative result must be combined with clinical observations, patient history, and epidemiological information. The expected result is Negative.  Fact Sheet for Patients:  BloggerCourse.com  Fact Sheet for Healthcare Providers:  SeriousBroker.it  This test is no t yet approved or cleared by the [place] FDA and  has been authorized for detection and/or diagnosis of SARS-CoV-2 by FDA under an Emergency Use Authorization (EUA). This EUA will remain  in effect (meaning this test can be used) for the duration of the COVID-19 declaration under Section 564(b)(1) of the Act, 21 U.S.C.section 360bbb-3(b)(1), unless the authorization is terminated  or revoked sooner.       Influenza A by PCR POSITIVE (A) NEGATIVE Final   Influenza B by PCR NEGATIVE NEGATIVE Final    Comment: (NOTE) The Xpert Xpress SARS-CoV-2/FLU/RSV plus assay is  intended as an aid in the diagnosis of influenza from Nasopharyngeal swab specimens and should not be used as a sole basis for treatment. Nasal washings and aspirates are unacceptable for Xpert Xpress SARS-CoV-2/FLU/RSV testing.  Fact Sheet for Patients: BloggerCourse.com  Fact Sheet for Healthcare Providers: SeriousBroker.it  This test is not yet approved or cleared by the [place] FDA and has been authorized for detection and/or diagnosis of SARS-CoV-2 by FDA under an Emergency Use Authorization (EUA). This EUA will remain in effect (meaning this  test can be used) for the duration of the COVID-19 declaration under Section 564(b)(1) of the Act, 21 U.S.C. section 360bbb-3(b)(1), unless the authorization is terminated or revoked.     Resp Syncytial Virus by PCR NEGATIVE NEGATIVE Final    Comment: (NOTE) Fact Sheet for Patients: BloggerCourse.com  Fact Sheet for Healthcare Providers: SeriousBroker.it  This test is not yet approved or cleared by the [place] FDA and has been authorized for detection and/or diagnosis of SARS-CoV-2 by FDA under an Emergency Use Authorization (EUA). This EUA will remain in effect (meaning this test can be used) for the duration of the COVID-19 declaration under Section 564(b)(1) of the Act, 21 U.S.C. section 360bbb-3(b)(1), unless the authorization is terminated or revoked.  Performed at [facility], [address] [place], [place] [address]       Studies: DG CHEST PORT 1 VIEW  Result Date: 09/28/2022 CLINICAL DATA:  Dyspnea. EXAM: PORTABLE CHEST 1 VIEW COMPARISON:  09/26/2022. FINDINGS: Unchanged hazy and consolidative opacities right upper and lower lobes, as well as the left lower lung, again suspicious for multifocal pneumonia. Stable cardiac and mediastinal contours. No pleural effusion or pneumothorax. IMPRESSION: Unchanged hazy and consolidative opacities right upper and lower lobes, as well as the left lower lung, again suspicious for multifocal pneumonia. Electronically Signed   By: Orvan Falconer M.D.   On: 09/28/2022 14:53    Scheduled Meds:  amiodarone  200 mg Oral Daily   apixaban  5 mg Oral BID   furosemide  40 mg Intravenous BID   gabapentin  600 mg Oral BID   hydrALAZINE  50 mg Oral TID   lisinopril  40 mg Oral Daily    Continuous Infusions:  azithromycin 500 mg (09/27/22 1736)   cefTRIAXone (ROCEPHIN)  IV 1 g (09/27/22 1743)     LOS: 2 days     Briant Cedar, MD [facility]  If 7PM-7AM, please contact night-coverage [email or web address] 09/28/2022, 3:34 PM

## 2022-09-28 NOTE — Progress Notes (Signed)
Prior-To-Admission Oral Chemotherapy for Treatment of Oncologic Disease   Order noted to initially continue prior-to-admission oral chemotherapy regimen of Pomalyst.  Procedure Per Pharmacy & Therapeutics Committee Policy: Orders for continuation of home oral chemotherapy for treatment of an oncologic disease will be held unless approved by an oncologist during current admission.  I discussed with Dr. Leonides Schanz 5/28 - recommends to hold oral chemotherapy while patient is admitted.  Oral chemotherapy continuation order remains on hold. TRH and RN made aware of plan.   Pricilla Riffle, PharmD, BCPS Clinical Pharmacist 09/28/2022 9:07 AM

## 2022-09-29 ENCOUNTER — Inpatient Hospital Stay (HOSPITAL_COMMUNITY): Payer: Medicare Other

## 2022-09-29 DIAGNOSIS — I10 Essential (primary) hypertension: Secondary | ICD-10-CM | POA: Diagnosis not present

## 2022-09-29 DIAGNOSIS — J9601 Acute respiratory failure with hypoxia: Secondary | ICD-10-CM

## 2022-09-29 DIAGNOSIS — E876 Hypokalemia: Secondary | ICD-10-CM

## 2022-09-29 DIAGNOSIS — I480 Paroxysmal atrial fibrillation: Secondary | ICD-10-CM | POA: Diagnosis not present

## 2022-09-29 DIAGNOSIS — J09X2 Influenza due to identified novel influenza A virus with other respiratory manifestations: Secondary | ICD-10-CM | POA: Diagnosis not present

## 2022-09-29 DIAGNOSIS — G4733 Obstructive sleep apnea (adult) (pediatric): Secondary | ICD-10-CM

## 2022-09-29 DIAGNOSIS — C9000 Multiple myeloma not having achieved remission: Secondary | ICD-10-CM

## 2022-09-29 LAB — CBC WITH DIFFERENTIAL/PLATELET
Abs Immature Granulocytes: 0.01 10*3/uL (ref 0.00–0.07)
Basophils Absolute: 0 10*3/uL (ref 0.0–0.1)
Basophils Relative: 2 %
Eosinophils Absolute: 0.1 10*3/uL (ref 0.0–0.5)
Eosinophils Relative: 5 %
HCT: 31.7 % — ABNORMAL LOW (ref 39.0–52.0)
Hemoglobin: 10 g/dL — ABNORMAL LOW (ref 13.0–17.0)
Immature Granulocytes: 1 %
Lymphocytes Relative: 15 %
Lymphs Abs: 0.3 10*3/uL — ABNORMAL LOW (ref 0.7–4.0)
MCH: 31.2 pg (ref 26.0–34.0)
MCHC: 31.5 g/dL (ref 30.0–36.0)
MCV: 98.8 fL (ref 80.0–100.0)
Monocytes Absolute: 0.4 10*3/uL (ref 0.1–1.0)
Monocytes Relative: 20 %
Neutro Abs: 1.1 10*3/uL — ABNORMAL LOW (ref 1.7–7.7)
Neutrophils Relative %: 57 %
Platelets: 115 10*3/uL — ABNORMAL LOW (ref 150–400)
RBC: 3.21 MIL/uL — ABNORMAL LOW (ref 4.22–5.81)
RDW: 15.9 % — ABNORMAL HIGH (ref 11.5–15.5)
WBC: 1.9 10*3/uL — ABNORMAL LOW (ref 4.0–10.5)
nRBC: 0 % (ref 0.0–0.2)

## 2022-09-29 LAB — BASIC METABOLIC PANEL
Anion gap: 7 (ref 5–15)
BUN: 18 mg/dL (ref 8–23)
CO2: 28 mmol/L (ref 22–32)
Calcium: 7.9 mg/dL — ABNORMAL LOW (ref 8.9–10.3)
Chloride: 105 mmol/L (ref 98–111)
Creatinine, Ser: 0.77 mg/dL (ref 0.61–1.24)
GFR, Estimated: >60 mL/min (ref >60)
Glucose, Bld: 90 mg/dL (ref 70–99)
Potassium: 2.9 mmol/L — ABNORMAL LOW (ref 3.5–5.1)
Sodium: 140 mmol/L (ref 135–145)

## 2022-09-29 LAB — PROCALCITONIN: Procalcitonin: <0.1 ng/mL

## 2022-09-29 LAB — C-REACTIVE PROTEIN: CRP: 10.5 mg/dL — ABNORMAL HIGH (ref <1.0)

## 2022-09-29 LAB — MAGNESIUM: Magnesium: 2 mg/dL (ref 1.7–2.4)

## 2022-09-29 MED ORDER — POTASSIUM CHLORIDE CRYS ER 20 MEQ PO TBCR
40.0000 meq | EXTENDED_RELEASE_TABLET | ORAL | Status: AC
  Administered 2022-09-29 (×2): 40 meq via ORAL
  Filled 2022-09-29 (×2): qty 2

## 2022-09-29 MED ORDER — IOHEXOL 300 MG/ML  SOLN
75.0000 mL | Freq: Once | INTRAMUSCULAR | Status: AC | PRN
  Administered 2022-09-29: 75 mL via INTRAVENOUS

## 2022-09-29 MED ORDER — ACYCLOVIR 400 MG PO TABS
400.0000 mg | ORAL_TABLET | Freq: Two times a day (BID) | ORAL | Status: DC
  Administered 2022-09-29 – 2022-10-03 (×8): 400 mg via ORAL
  Filled 2022-09-29 (×8): qty 1

## 2022-09-29 NOTE — Progress Notes (Signed)
Mobility Specialist - Progress Note   09/29/22 0934  Oxygen Therapy  SpO2 (!) 82 %  O2 Device Room Air  Patient Activity (if Appropriate) Ambulating  Mobility  Activity Ambulated with assistance in hallway;Ambulated with assistance to bathroom  Level of Assistance Standby assist, set-up cues, supervision of patient - no hands on  Assistive Device Front wheel walker  Distance Ambulated (ft) 275 ft  Activity Response Tolerated well  Mobility Referral Yes  $Mobility charge 1 Mobility  Mobility Specialist Start Time (ACUTE ONLY) [id number]  Mobility Specialist Stop Time (ACUTE ONLY) [id number]  Mobility Specialist Time Calculation (min) (ACUTE ONLY) 7 min   Pt received in recliner and agreeable to mobility. While ambulating, pt desat to 82%. Encouraged a standing rest break along side pursed lip breaths allowing O2 to come back up to 88%. No complaints during session. Pt to bathroom after session with all needs met.    Pre-mobility: 90% SpO2 (RA) During mobility: 82-88% SpO2 (RA) Post-mobility: 88% SPO2 (RA)  Chief Technology Officer

## 2022-09-29 NOTE — Assessment & Plan Note (Addendum)
Thought to be multifactorial secondary to influenza A infection with suspected superimposed bacterial pneumonia and cardiogenic pulmonary edema Completing 7-day course of antibiotics today. Continuing to provide intravenous Lasix Bronchodilator therapy for associated bronchospasm. CT imaging of chest performed on 5/29 confirming multifocal pneumonia with bilateral pleural effusions Patient is unlikely to have superimposed pulmonary embolism due to ongoing Eliquis use

## 2022-09-29 NOTE — Assessment & Plan Note (Deleted)
·   Replacing with potassium chloride °· Evaluating for concurrent hypomagnesemia  °· Monitoring potassium levels with serial chemistries. ° °

## 2022-09-29 NOTE — Progress Notes (Signed)
Patient is a 72 year old male, admitted for shortness of breath. Patient is alert and oriented x 4 and has clear speech. Patient has normal heart sounds no murmurs heard . Has active bowel sounds in all four quadrants, has regular bowel movements. Patient has a nasal cannula to help with the SOB. Didn't have any pain at all today 0 on a scale 0-10 . Eyes PERRLA. T:98.3 HR:50 BP 142/68 RR 18. Patient's skin is dry does have edema in the lower extremity on the legs. Patient lungs did have a small wheezing noise but later in the day lungs sound normal. Took a walk down the hall without complaining of SOB and maintained oxygen saturation over 92%.    Student RN, Genoveva Ill Prinston Kynard

## 2022-09-29 NOTE — Progress Notes (Signed)
PROGRESS NOTE   Travis [name]  ZOX:[id number] DOB: 03/18/1951 DOA: 09/26/2022 PCP: [name], MD   Date of Service: the patient was seen and examined on 09/29/2022  Brief Narrative:  72 year old male with past medical history of hypertension, atrial fibrillation on Eliquis, obesity, multiple myeloma presenting to [facility] emergency department via EMS with worsening cough and shortness of breath.  Upon evaluation in the emergency department patient continued to exhibit shortness of breath and was found to be tachypneic with evidence of developing right upper and right lower lobe infiltrates on chest x-ray.  Patient was found to be influenza A positive.  Hospitalist group was then called to assess the patient for admission the hospital.  Patient was treated with bronchodilator therapy and empiric antibiotic therapy with intravenous azithromycin and ceftriaxone.  Due to concerns for concurrent acute pulmonary edema patient was also placed on intravenous Lasix.   Assessment and Plan: * Acute respiratory failure with hypoxia (HCC) Thought to be multifactorial secondary to influenza A infection with possible superimposed pulmonary edema Continuing to empirically for possible superimposed bacterial pneumonia. Due to continued symptoms and hypoxia, obtaining CT chest with IV contrast to better evaluate infiltrates Patient is unlikely to have superimposed pulmonary embolism due to ongoing Eliquis use  Infection due to novel influenza A virus Supportive care with supplemental oxygen and bronchodilator therapy as well as treatment with intravenous antibiotics for possible concurrent bacterial infection  Paroxysmal atrial fibrillation (HCC) Continue amiodarone Continue apixaban for anticoagulation  Essential hypertension Continue lisinopril and hydralazine  Hypokalemia Replacing with potassium chloride Evaluating for concurrent hypomagnesemia  Monitoring potassium  levels with serial chemistries.  Multiple myeloma (HCC) Holding Pomalyst Patient follows with Dr. Leonides Schanz with oncology in the outpatient setting  OSA (obstructive sleep apnea) Continuing home regimen of CPAP nightly     Subjective:  Patient continues to complain of shortness of breath, moderate to severe in intensity, worse with minimal exertion.  Patient denies any chest pain.  Patient reports that his associated bilateral lower extremity swelling has somewhat improved since yesterday.  Physical Exam:  Vitals:   09/28/22 2136 09/29/22 0442 09/29/22 0934 09/29/22 1330  BP:  (!) 142/68  (!) 148/58  Pulse: (!) 57 (!) 50  (!) 57  Resp: 20 18  16   Temp:  98.3 F (36.8 C)  98.9 F (37.2 C)  TempSrc:  Oral  Oral  SpO2: 93% 95% (!) 82% 97%  Weight:      Height:        Constitutional: Awake alert and oriented x3, no associated distress.   Skin: no rashes, no lesions, good skin turgor noted. Eyes: Pupils are equally reactive to light.  No evidence of scleral icterus or conjunctival pallor.  ENMT: Moist mucous membranes noted.  Posterior pharynx clear of any exudate or lesions.   Respiratory: Bibasilar rales.  No evidence of wheezing.  Normal respiratory effort. No accessory muscle use.  Cardiovascular: Regular rate and rhythm, no murmurs / rubs / gallops. No extremity edema. 2+ pedal pulses. No carotid bruits.  Abdomen: Abdomen is soft and nontender.  No evidence of intra-abdominal masses.  Positive bowel sounds noted in all quadrants.   Musculoskeletal: No joint deformity upper and lower extremities. Good ROM, no contractures. Normal muscle tone.    Data Reviewed:  I have personally reviewed and interpreted labs, imaging.  Significant findings are   CBC: Recent Labs  Lab 09/26/22 1556 09/27/22 0412 09/28/22 0452 09/29/22 0459  WBC 4.2 3.5* 2.7* 1.9*  NEUTROABS  --   --  2.0 1.1*  HGB 11.4* 10.9* 9.7* 10.0*  HCT 35.4* 33.9* 30.4* 31.7*  MCV 98.3 98.8 98.1 98.8  PLT  138* 115* 113* 115*   Basic Metabolic Panel: Recent Labs  Lab 09/26/22 1556 09/27/22 0412 09/28/22 0452 09/29/22 0459  NA 140 138 137 140  K 3.9 4.1 3.4* 2.9*  CL 105 107 106 105  CO2 26 24 24 28   GLUCOSE 121* 160* 108* 90  BUN 18 22 26* 18  CREATININE 0.94 0.75 0.83 0.77  CALCIUM 8.8* 8.5* 8.0* 7.9*  MG  --   --   --  2.0     Code Status:  Full code.   Severity of Illness:  The appropriate patient status for this patient is INPATIENT. Inpatient status is judged to be reasonable and necessary in order to provide the required intensity of service to ensure the patient's safety. The patient's presenting symptoms, physical exam findings, and initial radiographic and laboratory data in the context of their chronic comorbidities is felt to place them at high risk for further clinical deterioration. Furthermore, it is not anticipated that the patient will be medically stable for discharge from the hospital within 2 midnights of admission.   * I certify that at the point of admission it is my clinical judgment that the patient will require inpatient hospital care spanning beyond 2 midnights from the point of admission due to high intensity of service, high risk for further deterioration and high frequency of surveillance required.*  Time spent:  54 minutes  Author:  Marinda Elk MD  09/29/2022 8:39 PM

## 2022-09-29 NOTE — Assessment & Plan Note (Addendum)
Persisting and quite severe due to ongoing diuretic use Continue to replace aggressive fluid with additional doses of potassium chloride Evaluating for concurrent hypomagnesemia  Monitoring potassium levels with serial chemistries.

## 2022-09-29 NOTE — Hospital Course (Addendum)
72 year old male with past medical history of hypertension, atrial fibrillation on Eliquis, obesity, multiple myeloma presenting to [facility] emergency department via EMS with worsening cough and shortness of breath.  Upon evaluation in the emergency department patient continued to exhibit shortness of breath and was found to be tachypneic with evidence of developing right upper and right lower lobe infiltrates on chest x-ray.  Patient was found to be influenza A positive.  Hospitalist group was then called to assess the patient for admission the hospital.  Patient was treated with bronchodilator therapy and empiric antibiotic therapy with intravenous azithromycin and ceftriaxone.  Due to concerns for concurrent acute pulmonary edema patient was also placed on intravenous Lasix.  Patient experienced a prolonged hospital course secondary to persisting oxygen requirement.  However, with a combination of antibiotic therapy, bronchodilator therapy and aggressive intravenous diuretics patient did slowly improved.  Patient was eventually weaned off of supplemental oxygen and was able to ambulate throughout the medical unit without shortness of breath.  Hospital course was complicated by intermittent bouts of rapid atrial fibrillation.  Upon evaluation it was felt the patient's bouts of rapid atrial fibrillation were due to intermittent substantial hypokalemia from aggressive diuresis.  With aggressive replacement of potassium bouts of rapid atrial fibrillation esolved.  Patient's usual regimen of AV nodal blocking therapy with amiodarone did not need to be adjusted.  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  Patient was advised to inquire with his outpatient providers including his outpatient oncologist as to how this should be evaluated in the outpatient setting.  Patient continued to clinically improve and was discharged home in  improved and stable condition on 10/03/2022.  A persisting right upper lobe area of infiltrate was noted on his final chest x-ray however clinically the patient appeared to be doing quite well.  It was felt that this persisting the trait was lagging behind the patient's clinical picture.  Patient is being discharged home with 3 additional days of oral antibiotics with Omnicef and doxycycline to complete a total of 10 days of antibiotics considering patient's immunocompromised state.  Patient will be following closely with his outpatient providers including Dr. Leonides Schanz his oncologist in the coming days, next follow-up appointment on 6/5.

## 2022-09-29 NOTE — Assessment & Plan Note (Signed)
.   Continuing home regimen of CPAP nightly  

## 2022-09-29 NOTE — Assessment & Plan Note (Addendum)
Supportive care with supplemental oxygen and bronchodilator therapy as well as treatment with intravenous antibiotics for possible concurrent bacterial infection Remainder of assessment and plan as above

## 2022-09-29 NOTE — Assessment & Plan Note (Addendum)
Overnight patient began to exhibit bouts of rapid atrial fibrillation associated with palpitations  Patient was given several doses of intravenous diltiazem and transferred to a telemetry unit.   I believe substantial hypokalemia in the setting of diuresis is what is prompting this rapid rate  Aggressively replacing potassium  continue amiodarone Continue apixaban for anticoagulation

## 2022-09-29 NOTE — Assessment & Plan Note (Signed)
Continue lisinopril and hydralazine

## 2022-09-29 NOTE — Assessment & Plan Note (Signed)
Holding Pomalyst Patient follows with Dr. Leonides Schanz with oncology in the outpatient setting

## 2022-09-29 NOTE — Progress Notes (Signed)
   09/29/22 2159  BiPAP/CPAP/SIPAP  BiPAP/CPAP/SIPAP Pt Type Adult (pt declined assistance, prefers self-placement of home unit when ready for bed)  BiPAP/CPAP/SIPAP Resmed  Mask Type Full face mask  EPAP 9.8 cmH2O  Flow Rate 2 lpm  Heater Temperature  (sterile water added to humidifier)  Patient Home Equipment Yes (no frays on cord or obvious defects noted, machine plugged into red outlet)  Auto Titrate No  Safety Check Completed by RT for Home Unit Yes, no issues noted

## 2022-09-29 NOTE — Care Management Important Message (Signed)
Important Message  Patient Details IM Letter given. Name: Travis [name] MRN: [id number] Date of Birth: 08/09/1950   Medicare Important Message Given:  Yes     Caren Macadam 09/29/2022, 3:23 PM

## 2022-09-30 ENCOUNTER — Ambulatory Visit: Payer: Medicare Other | Admitting: Hematology and Oncology

## 2022-09-30 ENCOUNTER — Other Ambulatory Visit: Payer: Medicare Other

## 2022-09-30 ENCOUNTER — Ambulatory Visit: Payer: Medicare Other

## 2022-09-30 DIAGNOSIS — J09X2 Influenza due to identified novel influenza A virus with other respiratory manifestations: Secondary | ICD-10-CM | POA: Diagnosis not present

## 2022-09-30 DIAGNOSIS — I10 Essential (primary) hypertension: Secondary | ICD-10-CM | POA: Diagnosis not present

## 2022-09-30 DIAGNOSIS — I480 Paroxysmal atrial fibrillation: Secondary | ICD-10-CM | POA: Diagnosis not present

## 2022-09-30 DIAGNOSIS — J9601 Acute respiratory failure with hypoxia: Secondary | ICD-10-CM | POA: Diagnosis not present

## 2022-09-30 DIAGNOSIS — K862 Cyst of pancreas: Secondary | ICD-10-CM | POA: Diagnosis present

## 2022-09-30 LAB — RESPIRATORY PANEL BY PCR

## 2022-09-30 LAB — COMPREHENSIVE METABOLIC PANEL
ALT: 25 U/L (ref 0–44)
AST: 17 U/L (ref 15–41)
Albumin: 2.8 g/dL — ABNORMAL LOW (ref 3.5–5.0)
Alkaline Phosphatase: 57 U/L (ref 38–126)
Anion gap: 7 (ref 5–15)
BUN: 17 mg/dL (ref 8–23)
CO2: 29 mmol/L (ref 22–32)
Calcium: 7.6 mg/dL — ABNORMAL LOW (ref 8.9–10.3)
Chloride: 101 mmol/L (ref 98–111)
Creatinine, Ser: 0.72 mg/dL (ref 0.61–1.24)
GFR, Estimated: >60 mL/min (ref >60)
Glucose, Bld: 98 mg/dL (ref 70–99)
Potassium: 3 mmol/L — ABNORMAL LOW (ref 3.5–5.1)
Sodium: 137 mmol/L (ref 135–145)
Total Bilirubin: 0.7 mg/dL (ref 0.3–1.2)
Total Protein: 5.3 g/dL — ABNORMAL LOW (ref 6.5–8.1)

## 2022-09-30 LAB — PHOSPHORUS: Phosphorus: 2.8 mg/dL (ref 2.5–4.6)

## 2022-09-30 LAB — MAGNESIUM: Magnesium: 1.8 mg/dL (ref 1.7–2.4)

## 2022-09-30 MED ORDER — SODIUM CHLORIDE 0.9 % IV SOLN
3.0000 g | Freq: Four times a day (QID) | INTRAVENOUS | Status: AC
  Administered 2022-09-30 – 2022-10-02 (×10): 3 g via INTRAVENOUS
  Filled 2022-09-30 (×12): qty 8

## 2022-09-30 MED ORDER — POTASSIUM CHLORIDE CRYS ER 20 MEQ PO TBCR
40.0000 meq | EXTENDED_RELEASE_TABLET | ORAL | Status: AC
  Administered 2022-09-30 (×2): 40 meq via ORAL
  Filled 2022-09-30 (×2): qty 2

## 2022-09-30 MED ORDER — FUROSEMIDE 40 MG PO TABS
40.0000 mg | ORAL_TABLET | Freq: Every day | ORAL | Status: DC
  Administered 2022-10-01: 40 mg via ORAL
  Filled 2022-09-30: qty 1

## 2022-09-30 NOTE — Progress Notes (Signed)
Mobility Specialist - Progress Note   09/30/22 0920  Oxygen Therapy  SpO2 (!) 85 %  O2 Device Room Air  Patient Activity (if Appropriate) Ambulating  Mobility  Activity Ambulated with assistance in hallway  Level of Assistance Standby assist, set-up cues, supervision of patient - no hands on  Assistive Device Front wheel walker  Distance Ambulated (ft) 230 ft  Activity Response Tolerated well  Mobility Referral Yes  $Mobility charge 1 Mobility  Mobility Specialist Start Time (ACUTE ONLY) 0907  Mobility Specialist Stop Time (ACUTE ONLY) 0916  Mobility Specialist Time Calculation (min) (ACUTE ONLY) 9 min   Pt received in recliner and agreeable to mobility. Upon entering room pt had [place] on. Pt opted out to ambulate without O2. During ambulation, pt desat to 85%. Pt c/o feeling dizzy which prompted in shorter ambulation. No other complaints during session. Pt to recliner after session with all needs met.   Pre-mobility: 93% SpO2 (RA) During mobility: 85% SpO2 (RA) Post-mobility: 91% SPO2 (2L )  Chief Technology Officer

## 2022-09-30 NOTE — Evaluation (Signed)
Clinical/Bedside Swallow Evaluation Patient Details  Name: Travis [name] MRN: [id number] Date of Birth: [birth year]  Today's Date: 09/30/2022 Time: SLP Start Time (ACUTE ONLY): 1330 SLP Stop Time (ACUTE ONLY): 1400 SLP Time Calculation (min) (ACUTE ONLY): 30 min  Past Medical History:  Past Medical History:  Diagnosis Date   A-fib (HCC)    Arthritis    "comes and goes; mostly in hands, occasionally elbow" (04/05/2017)   Complication of anesthesia    "just wanted to sleep alot  for couple days S/P VARICOCELE OR" (04/05/2017)   DVT (deep venous thrombosis) (HCC)    LLE   Dysrhythmia    PVCs    GERD (gastroesophageal reflux disease)    History of hiatal hernia    History of radiation therapy    Thoracic Spine- 07/13/22-07/23/22-Dr. Antony Blackbird   Hypertension    Impaired glucose tolerance    Multiple myeloma (HCC)    Obesity (BMI 30-39.9) 07/26/2016   OSA on CPAP 07/26/2016   Mild with AHI 12.5/hr now on CPAP at 14cm H2O   PAF (paroxysmal atrial fibrillation) (HCC)    s/p PVI Ablation in 2018 // Flecainide Rx // Echo 8/21: EF 60-65, no RWMA, mild LVH, normal RV SF, moderate RVE, mild BAE, trivial MR, mild dilation of aortic root (40 mm)   Pneumonia    "may have had walking pneumonia a few years ago" (04/05/2017)   Pre-diabetes    Thrombophlebitis    Past Surgical History:  Past Surgical History:  Procedure Laterality Date   ATRIAL FIBRILLATION ABLATION  04/05/2017   ATRIAL FIBRILLATION ABLATION N/A 04/05/2017   Procedure: ATRIAL FIBRILLATION ABLATION;  Surgeon: [name], MD;  Location: [facility];  Service: Cardiovascular;  Laterality: N/A;   BONE BIOPSY Right 12/17/2021   Procedure: BONE BIOPSY;  Surgeon: [name], MD;  Location: [facility];  Service: Orthopedics;  Laterality: Right;   COMPLETE RIGHT HIP REPLACMENT  01/19/2019   EVALUATION UNDER ANESTHESIA WITH HEMORRHOIDECTOMY N/A 02/24/2017   Procedure: EXAM UNDER ANESTHESIA WITH   HEMORRHOIDECTOMY;  Surgeon: [name], MD;  Location: [facility];  Service: General;  Laterality: N/A;   EXCISIONAL TOTAL HIP ARTHROPLASTY WITH ANTIBIOTIC SPACERS Right 09/25/2020   Procedure: EXCISIONAL TOTAL HIP ARTHROPLASTY WITH ANTIBIOTIC SPACERS;  Surgeon: [name], MD;  Location: [facility];  Service: Orthopedics;  Laterality: Right;   FLEXIBLE SIGMOIDOSCOPY N/A 04/15/2015   Procedure:  UNSEDATED FLEXIBLE SIGMOIDOSCOPY;  Surgeon: [name], MD;  Location: [facility];  Service: Endoscopy;  Laterality: N/A;   HUMERUS IM NAIL Right 12/17/2021   Procedure: INTRAMEDULLARY (IM) NAIL HUMERAL;  Surgeon: [name], MD;  Location: [facility];  Service: Orthopedics;  Laterality: Right;   KNEE ARTHROSCOPY Left ~ 2016   POSTERIOR CERVICAL FUSION/FORAMINOTOMY N/A 06/04/2022   Procedure: Thoracic one - Thoracic Four Posterior lateral arthrodesis/ fusion and Thoracic two Corpectomy;  Surgeon: [name], MD;  Location: [facility];  Service: Neurosurgery;  Laterality: N/A;   REIMPLANTATION OF TOTAL HIP Right 12/25/2020   Procedure: REIMPLANTATION/REVISION OF RIGHT TOTAL HIP VERSUS REPEAT IRRIGATION AND DEBRIDEMENT;  Surgeon: [name], MD;  Location: [facility];  Service: Orthopedics;  Laterality: Right;   TESTICLE SURGERY  1988   VARICOCELE   TONSILLECTOMY     VARICOSE VEIN SURGERY Bilateral    HPI:  72 year old male with past medical history of hypertension, atrial fibrillation on Eliquis, obesity, multiple myeloma presenting to [facility] emergency department via EMS with worsening cough and shortness  of breath.     Upon evaluation in the emergency department patient continued to exhibit shortness of breath and was found to be tachypneic with evidence of developing right upper and right lower lobe infiltrates on chest x-ray    Assessment / Plan / Recommendation  Clinical Impression  Normal oropharyngeal swallow ability based on clinical swallow eval.  CN exam  unremarkable, pt did not pass 3 ounce [place] water screen due to having subtle cough approx 25 seconds after swallowing water. However no further coughing associated with intake observed.   Pt's swallow and respiratory pattern were in sync.  Mastication was timely with full oral clearance. Pt denies dysphagia - admits to h/o GERD but denies this being an issue currently.   Recommend continue diet with general aspiration precautions. Advised pt to recommendations using teach back.  Of note, pt reports he had Norovirus while on a recent cruise - did not inquire if he had vomiting.   No SLP follow up indicated, thanks for this consult. SLP Visit Diagnosis: Dysphagia, unspecified (R13.10)    Aspiration Risk  Mild aspiration risk    Diet Recommendation Regular;Thin liquid   Liquid Administration via: Cup;Straw Medication Administration: Whole meds with liquid Supervision: Patient able to self feed Compensations: Slow rate;Small sips/bites Postural Changes: Seated upright at 90 degrees;Remain upright for at least 30 minutes after po intake    Other  Recommendations Oral Care Recommendations: Oral care BID    Recommendations for follow up therapy are one component of a multi-disciplinary discharge planning process, led by the attending physician.  Recommendations may be updated based on patient status, additional functional criteria and insurance authorization.  Follow up Recommendations No SLP follow up      Assistance Recommended at Discharge  N/a  Functional Status Assessment Patient has not had a recent decline in their functional status  Frequency and Duration   N/a         Prognosis   N/a     Swallow Study   General Date of Onset: 09/30/22 HPI: 72 year old male with past medical history of hypertension, atrial fibrillation on Eliquis, obesity, multiple myeloma presenting to [facility] emergency department via EMS with worsening cough and shortness of breath.     Upon  evaluation in the emergency department patient continued to exhibit shortness of breath and was found to be tachypneic with evidence of developing right upper and right lower lobe infiltrates on chest x-ray Diet Prior to this Study: Regular;Thin liquids (Level 0) Temperature Spikes Noted: No Respiratory Status: Nasal cannula History of Recent Intubation: No Behavior/Cognition: Alert;Cooperative;Pleasant mood Oral Cavity Assessment: Within Functional Limits Oral Care Completed by SLP: No Oral Cavity - Dentition: Adequate natural dentition Vision: Functional for self-feeding Self-Feeding Abilities: Able to feed self Patient Positioning: Upright in bed Baseline Vocal Quality: Normal Volitional Cough: Strong    Oral/Motor/Sensory Function Overall Oral Motor/Sensory Function: Within functional limits   Ice Chips Ice chips: Not tested   Thin Liquid Thin Liquid: Within functional limits Presentation: Cup;Self Fed    Nectar Thick Nectar Thick Liquid: Not tested   Honey Thick Honey Thick Liquid: Not tested   Puree Puree: Within functional limits Presentation: Self Fed;Spoon   Solid     Solid: Within functional limits Presentation: Self Fed      Chales Abrahams 09/30/2022,2:17 PM   Rolena Infante, MS [facility] SLP Acute Rehab Services Office [phone]

## 2022-09-30 NOTE — Progress Notes (Signed)
PROGRESS NOTE   Travis [name]  ZOX:[id number] DOB: 08-22-1950 DOA: 09/26/2022 PCP: [name], MD   Date of Service: the patient was seen and examined on 09/30/2022  Brief Narrative:  72 year old male with past medical history of hypertension, atrial fibrillation on Eliquis, obesity, multiple myeloma presenting to [facility] emergency department via EMS with worsening cough and shortness of breath.  Upon evaluation in the emergency department patient continued to exhibit shortness of breath and was found to be tachypneic with evidence of developing right upper and right lower lobe infiltrates on chest x-ray.  Patient was found to be influenza A positive.  Hospitalist group was then called to assess the patient for admission the hospital.  Patient was treated with bronchodilator therapy and empiric antibiotic therapy with intravenous azithromycin and ceftriaxone.  Due to concerns for concurrent acute pulmonary edema patient was also placed on intravenous Lasix.   Assessment and Plan: * Acute respiratory failure with hypoxia (HCC) Thought to be multifactorial secondary to influenza A infection with suspected superimposed bacterial pneumonia and pulmonary edema Antibiotic regimen adjusted to include Unasyn and azithromycin Transitioning patient off of intravenous diuretics today. CT imaging of chest performed on 5/29 confirming multifocal pneumonia with bilateral pleural effusions Patient is unlikely to have superimposed pulmonary embolism due to ongoing Eliquis use  Infection due to novel influenza A virus Supportive care with supplemental oxygen and bronchodilator therapy as well as treatment with intravenous antibiotics for possible concurrent bacterial infection  Paroxysmal atrial fibrillation (HCC) Continue amiodarone Continue apixaban for anticoagulation  Essential hypertension Continue lisinopril and hydralazine  Hypokalemia Persisting due to ongoing  diuretic use Continue to replace with additional doses of potassium chloride Evaluating for concurrent hypomagnesemia  Monitoring potassium levels with serial chemistries.  Multiple myeloma (HCC) Evidence of myeloma related lesions of the sixth rib, T11, spinous process, left L1 and L2 pedicles that are unchanged compared to prior imaging Holding Pomalyst Patient follows with Dr. Leonides Schanz with oncology in the outpatient setting  Pancreatic cyst Identified incidentally on CT imaging of the chest Further nonurgent outpatient evaluation with MRI  OSA (obstructive sleep apnea) Continuing home regimen of CPAP nightly     Subjective:  Patient complaining of "feeling woozy."  Patient complains of generalized weakness and ongoing shortness of breath with exertion.  Patient denies any associated chest pain.  Physical Exam:  Vitals:   09/30/22 0542 09/30/22 0920 09/30/22 1422 09/30/22 1631  BP: (!) 140/65  (!) 178/63   Pulse:   (!) 58   Resp: 18  19   Temp: 98.1 F (36.7 C)  98.7 F (37.1 C)   TempSrc: Oral  Oral   SpO2: 95% (!) 85% 94% 92%  Weight: 115 kg     Height:        Constitutional: Awake alert and oriented x3, no associated distress.   Skin: no rashes, no lesions, poor skin turgor noted. Eyes: Pupils are equally reactive to light.  No evidence of scleral icterus or conjunctival pallor.  ENMT: Moist mucous membranes noted.  Posterior pharynx clear of any exudate or lesions.   Respiratory: Bibasilar mid field rales with intermittent mild expiratory wheezing.  Normal respiratory effort. No accessory muscle use.  Cardiovascular: Regular rate and rhythm, no murmurs / rubs / gallops. No extremity edema. 2+ pedal pulses. No carotid bruits.  Abdomen: Abdomen is soft and nontender.  No evidence of intra-abdominal masses.  Positive bowel sounds noted in all quadrants.   Musculoskeletal: No joint deformity upper and lower  extremities. Good ROM, no contractures. Normal muscle tone.     Data Reviewed:  I have personally reviewed and interpreted labs, imaging.  Significant findings are   CBC: Recent Labs  Lab 09/26/22 1556 09/27/22 0412 09/28/22 0452 09/29/22 0459  WBC 4.2 3.5* 2.7* 1.9*  NEUTROABS  --   --  2.0 1.1*  HGB 11.4* 10.9* 9.7* 10.0*  HCT 35.4* 33.9* 30.4* 31.7*  MCV 98.3 98.8 98.1 98.8  PLT 138* 115* 113* 115*   Basic Metabolic Panel: Recent Labs  Lab 09/26/22 1556 09/27/22 0412 09/28/22 0452 09/29/22 0459 09/30/22 0449  NA 140 138 137 140 137  K 3.9 4.1 3.4* 2.9* 3.0*  CL 105 107 106 105 101  CO2 26 24 24 28 29   GLUCOSE 121* 160* 108* 90 98  BUN 18 22 26* 18 17  CREATININE 0.94 0.75 0.83 0.77 0.72  CALCIUM 8.8* 8.5* 8.0* 7.9* 7.6*  MG  --   --   --  2.0 1.8  PHOS  --   --   --   --  2.8   GFR: Estimated Creatinine Clearance: 107.6 mL/min (by C-G formula based on SCr of 0.72 mg/dL). Liver Function Tests: Recent Labs  Lab 09/30/22 0449  AST 17  ALT 25  ALKPHOS 57  BILITOT 0.7  PROT 5.3*  ALBUMIN 2.8*      Code Status:  Full code.  Code status decision has been confirmed with: patient    Severity of Illness:  The appropriate patient status for this patient is INPATIENT. Inpatient status is judged to be reasonable and necessary in order to provide the required intensity of service to ensure the patient's safety. The patient's presenting symptoms, physical exam findings, and initial radiographic and laboratory data in the context of their chronic comorbidities is felt to place them at high risk for further clinical deterioration. Furthermore, it is not anticipated that the patient will be medically stable for discharge from the hospital within 2 midnights of admission.   * I certify that at the point of admission it is my clinical judgment that the patient will require inpatient hospital care spanning beyond 2 midnights from the point of admission due to high intensity of service, high risk for further deterioration and  high frequency of surveillance required.*  Time spent:  52 minutes  Author:  Marinda Elk MD  09/30/2022 8:36 PM

## 2022-09-30 NOTE — Assessment & Plan Note (Signed)
Identified incidentally on CT imaging of the chest Further nonurgent outpatient evaluation with MRI

## 2022-09-30 NOTE — Progress Notes (Signed)
PHARMACY NOTE -  Unasyn  Pharmacy has been assisting with dosing of Unasyn for aspiration pneumonia. Dosage remains stable at 3g IV q6 hr and further renal adjustments per institutional Pharmacy antibiotic protocol  Pharmacy will sign off, following peripherally for culture results, dose adjustments, and length of therapy. Please reconsult if a change in clinical status warrants re-evaluation of dosage.  Bernadene Person, PharmD, BCPS [phone] 09/30/2022, 10:09 AM

## 2022-09-30 NOTE — Progress Notes (Signed)
   09/30/22 2136  BiPAP/CPAP/SIPAP  BiPAP/CPAP/SIPAP Pt Type Adult  BiPAP/CPAP/SIPAP Resmed  Mask Type Full face mask  Respiratory Rate 20 breaths/min  EPAP 9.8 cmH2O  Flow Rate 2 lpm  Patient Home Equipment Yes (machine plugged into red outlet, no frays on cord noted)  Auto Titrate No  Safety Check Completed by RT for Home Unit Yes, no issues noted  BiPAP/CPAP /SiPAP Vitals  Pulse Rate (!) 58  Resp 20  SpO2 94 %  MEWS Score/Color  MEWS Score 0  MEWS Score Color [place]

## 2022-09-30 NOTE — Progress Notes (Signed)
Mobility Specialist - Progress Note   09/30/22 1447  Mobility  Activity Ambulated with assistance in hallway  Level of Assistance Standby assist, set-up cues, supervision of patient - no hands on  Assistive Device Front wheel walker  Distance Ambulated (ft) 230 ft  Activity Response Tolerated well  Mobility Referral Yes  $Mobility charge 1 Mobility  Mobility Specialist Stop Time (ACUTE ONLY) 0246   Pt received in recliner and agreeable to mobility. During ambulation pt desat to 84%. Encouraged pursed lip breaths allowing O2 to come back up to 89%. No complaints during session. Pt to recliner after session with all needs met.     [facility]

## 2022-10-01 DIAGNOSIS — K862 Cyst of pancreas: Secondary | ICD-10-CM

## 2022-10-01 DIAGNOSIS — J09X2 Influenza due to identified novel influenza A virus with other respiratory manifestations: Secondary | ICD-10-CM | POA: Diagnosis not present

## 2022-10-01 DIAGNOSIS — I10 Essential (primary) hypertension: Secondary | ICD-10-CM | POA: Diagnosis not present

## 2022-10-01 DIAGNOSIS — J9601 Acute respiratory failure with hypoxia: Secondary | ICD-10-CM | POA: Diagnosis not present

## 2022-10-01 DIAGNOSIS — I480 Paroxysmal atrial fibrillation: Secondary | ICD-10-CM | POA: Diagnosis not present

## 2022-10-01 LAB — CBC WITH DIFFERENTIAL/PLATELET
Abs Immature Granulocytes: 0.03 10*3/uL (ref 0.00–0.07)
Basophils Absolute: 0 10*3/uL (ref 0.0–0.1)
Basophils Relative: 1 %
Eosinophils Absolute: 0 10*3/uL (ref 0.0–0.5)
Eosinophils Relative: 1 %
HCT: 29.8 % — ABNORMAL LOW (ref 39.0–52.0)
Hemoglobin: 9.4 g/dL — ABNORMAL LOW (ref 13.0–17.0)
Immature Granulocytes: 1 %
Lymphocytes Relative: 13 %
Lymphs Abs: 0.3 10*3/uL — ABNORMAL LOW (ref 0.7–4.0)
MCH: 30.9 pg (ref 26.0–34.0)
MCHC: 31.5 g/dL (ref 30.0–36.0)
MCV: 98 fL (ref 80.0–100.0)
Monocytes Absolute: 0.4 10*3/uL (ref 0.1–1.0)
Monocytes Relative: 17 %
Neutro Abs: 1.5 10*3/uL — ABNORMAL LOW (ref 1.7–7.7)
Neutrophils Relative %: 67 %
Platelets: 147 10*3/uL — ABNORMAL LOW (ref 150–400)
RBC: 3.04 MIL/uL — ABNORMAL LOW (ref 4.22–5.81)
RDW: 15.7 % — ABNORMAL HIGH (ref 11.5–15.5)
WBC: 2.3 10*3/uL — ABNORMAL LOW (ref 4.0–10.5)
nRBC: 0 % (ref 0.0–0.2)

## 2022-10-01 LAB — MAGNESIUM: Magnesium: 1.8 mg/dL (ref 1.7–2.4)

## 2022-10-01 LAB — C-REACTIVE PROTEIN: CRP: 11.6 mg/dL — ABNORMAL HIGH (ref <1.0)

## 2022-10-01 LAB — COMPREHENSIVE METABOLIC PANEL
ALT: 23 U/L (ref 0–44)
AST: 15 U/L (ref 15–41)
Albumin: 2.6 g/dL — ABNORMAL LOW (ref 3.5–5.0)
Alkaline Phosphatase: 49 U/L (ref 38–126)
Anion gap: 7 (ref 5–15)
BUN: 14 mg/dL (ref 8–23)
CO2: 26 mmol/L (ref 22–32)
Calcium: 7.6 mg/dL — ABNORMAL LOW (ref 8.9–10.3)
Chloride: 105 mmol/L (ref 98–111)
Creatinine, Ser: 0.72 mg/dL (ref 0.61–1.24)
GFR, Estimated: >60 mL/min (ref >60)
Glucose, Bld: 100 mg/dL — ABNORMAL HIGH (ref 70–99)
Potassium: 3.2 mmol/L — ABNORMAL LOW (ref 3.5–5.1)
Sodium: 138 mmol/L (ref 135–145)
Total Bilirubin: 0.8 mg/dL (ref 0.3–1.2)
Total Protein: 5.1 g/dL — ABNORMAL LOW (ref 6.5–8.1)

## 2022-10-01 LAB — BRAIN NATRIURETIC PEPTIDE: B Natriuretic Peptide: 331.3 pg/mL — ABNORMAL HIGH (ref 0.0–100.0)

## 2022-10-01 MED ORDER — POTASSIUM CHLORIDE CRYS ER 20 MEQ PO TBCR
40.0000 meq | EXTENDED_RELEASE_TABLET | Freq: Once | ORAL | Status: AC
  Administered 2022-10-01: 40 meq via ORAL
  Filled 2022-10-01: qty 2

## 2022-10-01 MED ORDER — DILTIAZEM HCL 25 MG/5ML IV SOLN
15.0000 mg | INTRAVENOUS | Status: AC
  Administered 2022-10-01: 15 mg via INTRAVENOUS
  Filled 2022-10-01: qty 5

## 2022-10-01 MED ORDER — FUROSEMIDE 10 MG/ML IJ SOLN
60.0000 mg | Freq: Once | INTRAMUSCULAR | Status: AC
  Administered 2022-10-01: 60 mg via INTRAVENOUS
  Filled 2022-10-01: qty 6

## 2022-10-01 NOTE — Progress Notes (Signed)
Pt felt like having afib after a hard cough.Notified on call A. Chavez,NP with new order to get EKG.EKG done and showed afib rvr. Received new orders to give Cardizem 15mg  IV push, notified Rapid response to give it.

## 2022-10-01 NOTE — Progress Notes (Signed)
Mobility Specialist - Progress Note   10/01/22 0919  Oxygen Therapy  SpO2 (!) 84 %  O2 Device Room Air  Patient Activity (if Appropriate) Ambulating  Mobility  Activity Ambulated with assistance in hallway  Level of Assistance Standby assist, set-up cues, supervision of patient - no hands on  Assistive Device Front wheel walker  Distance Ambulated (ft) 275 ft  Activity Response Tolerated well  Mobility Referral Yes  $Mobility charge 1 Mobility  Mobility Specialist Start Time (ACUTE ONLY) 0910  Mobility Specialist Stop Time (ACUTE ONLY) [id number]  Mobility Specialist Time Calculation (min) (ACUTE ONLY) 8 min   Pt received in recliner and agreeable to mobility. Upon arriving to room, pt had [place] on. Pt opted out to not wear [place] during ambulation. During ambulation pt desat to 84% on RA. C/o feeling lightheaded. When returning to room, put [place] back on 1.5L allowing O2 to come back up to 88%. No other complaints during session. Pt to recliner after session with all needs met.   Pre-mobility: 90% SpO2 (RA) During mobility: 84% SpO2 (RA) Post-mobility: 88% SPO2 (1.5L [place])  Chief Technology Officer

## 2022-10-01 NOTE — Progress Notes (Signed)
Pt transferred to rm [id number], All belongings taken with pt. Gave report to [facility].

## 2022-10-01 NOTE — Progress Notes (Signed)
Informed by RN that MD would like for PT to remain  on RA. Post nebulizer treatment PT was placed on RA. RN aware.

## 2022-10-01 NOTE — Progress Notes (Signed)
Order received to transfer pt to telemetry bed(rm [id number])

## 2022-10-01 NOTE — Progress Notes (Signed)
   10/01/22 2158  BiPAP/CPAP/SIPAP  BiPAP/CPAP/SIPAP Pt Type Adult (Pt prefers self placement when ready for bed)  BiPAP/CPAP/SIPAP Resmed  Mask Type Full face mask  Mask Size Large  FiO2 (%) 21 % (Per MD and Spouse request, the patient will remain on 21% room air unless his SpO2 drops)  Heater Temperature  (sterile water added to humidification chamber)  Patient Home Equipment Yes  Auto Titrate No

## 2022-10-01 NOTE — Progress Notes (Signed)
PROGRESS NOTE   Travis [name]  AOZ:[id number] DOB: 1950-05-06 DOA: 09/26/2022 PCP: [name], MD   Date of Service: the patient was seen and examined on 10/01/2022  Brief Narrative:  72 year old male with past medical history of hypertension, atrial fibrillation on Eliquis, obesity, multiple myeloma presenting to [facility] emergency department via EMS with worsening cough and shortness of breath.  Upon evaluation in the emergency department patient continued to exhibit shortness of breath and was found to be tachypneic with evidence of developing right upper and right lower lobe infiltrates on chest x-ray.  Patient was found to be influenza A positive.  Hospitalist group was then called to assess the patient for admission the hospital.  Patient was treated with bronchodilator therapy and empiric antibiotic therapy with intravenous azithromycin and ceftriaxone.  Due to concerns for concurrent acute pulmonary edema patient was also placed on intravenous Lasix.   Assessment and Plan: * Acute respiratory failure with hypoxia (HCC) Thought to be multifactorial secondary to influenza A infection with suspected superimposed bacterial pneumonia and pulmonary edema Antibiotic regimen adjusted to include Unasyn and azithromycin Transitioning patient off of intravenous diuretics today. CT imaging of chest performed on 5/29 confirming multifocal pneumonia with bilateral pleural effusions Patient is unlikely to have superimposed pulmonary embolism due to ongoing Eliquis use  Infection due to novel influenza A virus Supportive care with supplemental oxygen and bronchodilator therapy as well as treatment with intravenous antibiotics for possible concurrent bacterial infection  Paroxysmal atrial fibrillation (HCC) Continue amiodarone Continue apixaban for anticoagulation  Essential hypertension Continue lisinopril and hydralazine  Hypokalemia Persisting due to ongoing  diuretic use Continue to replace with additional doses of potassium chloride Evaluating for concurrent hypomagnesemia  Monitoring potassium levels with serial chemistries.  Multiple myeloma (HCC) Evidence of myeloma related lesions of the sixth rib, T11, spinous process, left L1 and L2 pedicles that are unchanged compared to prior imaging Holding Pomalyst Patient follows with Dr. Leonides Schanz with oncology in the outpatient setting  Pancreatic cyst Identified incidentally on CT imaging of the chest Further nonurgent outpatient evaluation with MRI  OSA (obstructive sleep apnea) Continuing home regimen of CPAP nightly     Subjective:  Patient complaining of "feeling woozy."  Patient complains of generalized weakness and ongoing shortness of breath with exertion.  Patient denies any associated chest pain.  Physical Exam:  Vitals:   09/30/22 2130 09/30/22 2136 10/01/22 0500 10/01/22 0527  BP: (!) 178/71   (!) 161/65  Pulse: (!) 58 (!) 58  (!) 55  Resp: 20 20  20   Temp: 99.1 F (37.3 C)   99.6 F (37.6 C)  TempSrc: Oral   Oral  SpO2: 98% 94%  96%  Weight:   109.9 kg   Height:        Constitutional: Awake alert and oriented x3, no associated distress.   Skin: no rashes, no lesions, poor skin turgor noted. Eyes: Pupils are equally reactive to light.  No evidence of scleral icterus or conjunctival pallor.  ENMT: Moist mucous membranes noted.  Posterior pharynx clear of any exudate or lesions.   Respiratory: Bibasilar mid field rales with intermittent mild expiratory wheezing.  Normal respiratory effort. No accessory muscle use.  Cardiovascular: Regular rate and rhythm, no murmurs / rubs / gallops. No extremity edema. 2+ pedal pulses. No carotid bruits.  Abdomen: Abdomen is soft and nontender.  No evidence of intra-abdominal masses.  Positive bowel sounds noted in all quadrants.   Musculoskeletal: No joint deformity upper and  lower extremities. Good ROM, no contractures. Normal muscle  tone.    Data Reviewed:  I have personally reviewed and interpreted labs, imaging.  Significant findings are   CBC: Recent Labs  Lab 09/26/22 1556 09/27/22 0412 09/28/22 0452 09/29/22 0459 10/01/22 0503  WBC 4.2 3.5* 2.7* 1.9* 2.3*  NEUTROABS  --   --  2.0 1.1* 1.5*  HGB 11.4* 10.9* 9.7* 10.0* 9.4*  HCT 35.4* 33.9* 30.4* 31.7* 29.8*  MCV 98.3 98.8 98.1 98.8 98.0  PLT 138* 115* 113* 115* 147*    Basic Metabolic Panel: Recent Labs  Lab 09/27/22 0412 09/28/22 0452 09/29/22 0459 09/30/22 0449 10/01/22 0503  NA 138 137 140 137 138  K 4.1 3.4* 2.9* 3.0* 3.2*  CL 107 106 105 101 105  CO2 24 24 28 29 26   GLUCOSE 160* 108* 90 98 100*  BUN 22 26* 18 17 14   CREATININE 0.75 0.83 0.77 0.72 0.72  CALCIUM 8.5* 8.0* 7.9* 7.6* 7.6*  MG  --   --  2.0 1.8 1.8  PHOS  --   --   --  2.8  --     GFR: Estimated Creatinine Clearance: 105.2 mL/min (by C-G formula based on SCr of 0.72 mg/dL). Liver Function Tests: Recent Labs  Lab 09/30/22 0449 10/01/22 0503  AST 17 15  ALT 25 23  ALKPHOS 57 49  BILITOT 0.7 0.8  PROT 5.3* 5.1*  ALBUMIN 2.8* 2.6*       Code Status:  Full code.  Code status decision has been confirmed with: patient    Severity of Illness:  The appropriate patient status for this patient is INPATIENT. Inpatient status is judged to be reasonable and necessary in order to provide the required intensity of service to ensure the patient's safety. The patient's presenting symptoms, physical exam findings, and initial radiographic and laboratory data in the context of their chronic comorbidities is felt to place them at high risk for further clinical deterioration. Furthermore, it is not anticipated that the patient will be medically stable for discharge from the hospital within 2 midnights of admission.   * I certify that at the point of admission it is my clinical judgment that the patient will require inpatient hospital care spanning beyond 2 midnights from the  point of admission due to high intensity of service, high risk for further deterioration and high frequency of surveillance required.*  Time spent:  52 minutes  Author:  Marinda Elk MD  10/01/2022 8:59 AM

## 2022-10-02 DIAGNOSIS — J09X2 Influenza due to identified novel influenza A virus with other respiratory manifestations: Secondary | ICD-10-CM | POA: Diagnosis not present

## 2022-10-02 DIAGNOSIS — I480 Paroxysmal atrial fibrillation: Secondary | ICD-10-CM | POA: Diagnosis not present

## 2022-10-02 DIAGNOSIS — I10 Essential (primary) hypertension: Secondary | ICD-10-CM | POA: Diagnosis not present

## 2022-10-02 DIAGNOSIS — J9601 Acute respiratory failure with hypoxia: Secondary | ICD-10-CM | POA: Diagnosis not present

## 2022-10-02 LAB — CBC WITH DIFFERENTIAL/PLATELET
Abs Immature Granulocytes: 0.01 10*3/uL (ref 0.00–0.07)
Basophils Absolute: 0 10*3/uL (ref 0.0–0.1)
Basophils Relative: 1 %
Eosinophils Absolute: 0.1 10*3/uL (ref 0.0–0.5)
Eosinophils Relative: 2 %
HCT: 31.7 % — ABNORMAL LOW (ref 39.0–52.0)
Hemoglobin: 10.2 g/dL — ABNORMAL LOW (ref 13.0–17.0)
Immature Granulocytes: 0 %
Lymphocytes Relative: 13 %
Lymphs Abs: 0.5 10*3/uL — ABNORMAL LOW (ref 0.7–4.0)
MCH: 31.2 pg (ref 26.0–34.0)
MCHC: 32.2 g/dL (ref 30.0–36.0)
MCV: 96.9 fL (ref 80.0–100.0)
Monocytes Absolute: 0.7 10*3/uL (ref 0.1–1.0)
Monocytes Relative: 21 %
Neutro Abs: 2.2 10*3/uL (ref 1.7–7.7)
Neutrophils Relative %: 63 %
Platelets: 199 10*3/uL (ref 150–400)
RBC: 3.27 MIL/uL — ABNORMAL LOW (ref 4.22–5.81)
RDW: 15.6 % — ABNORMAL HIGH (ref 11.5–15.5)
WBC: 3.5 10*3/uL — ABNORMAL LOW (ref 4.0–10.5)
nRBC: 0 % (ref 0.0–0.2)

## 2022-10-02 LAB — COMPREHENSIVE METABOLIC PANEL
ALT: 26 U/L (ref 0–44)
AST: 18 U/L (ref 15–41)
Albumin: 2.7 g/dL — ABNORMAL LOW (ref 3.5–5.0)
Alkaline Phosphatase: 56 U/L (ref 38–126)
Anion gap: 6 (ref 5–15)
BUN: 11 mg/dL (ref 8–23)
CO2: 25 mmol/L (ref 22–32)
Calcium: 7.7 mg/dL — ABNORMAL LOW (ref 8.9–10.3)
Chloride: 107 mmol/L (ref 98–111)
Creatinine, Ser: 0.63 mg/dL (ref 0.61–1.24)
GFR, Estimated: >60 mL/min (ref >60)
Glucose, Bld: 99 mg/dL (ref 70–99)
Potassium: 2.8 mmol/L — ABNORMAL LOW (ref 3.5–5.1)
Sodium: 138 mmol/L (ref 135–145)
Total Bilirubin: 0.7 mg/dL (ref 0.3–1.2)
Total Protein: 5.4 g/dL — ABNORMAL LOW (ref 6.5–8.1)

## 2022-10-02 LAB — MAGNESIUM: Magnesium: 1.9 mg/dL (ref 1.7–2.4)

## 2022-10-02 LAB — BASIC METABOLIC PANEL
Anion gap: 10 (ref 5–15)
BUN: 13 mg/dL (ref 8–23)
CO2: 26 mmol/L (ref 22–32)
Calcium: 8.2 mg/dL — ABNORMAL LOW (ref 8.9–10.3)
Chloride: 103 mmol/L (ref 98–111)
Creatinine, Ser: 0.83 mg/dL (ref 0.61–1.24)
GFR, Estimated: >60 mL/min (ref >60)
Glucose, Bld: 140 mg/dL — ABNORMAL HIGH (ref 70–99)
Potassium: 3.1 mmol/L — ABNORMAL LOW (ref 3.5–5.1)
Sodium: 139 mmol/L (ref 135–145)

## 2022-10-02 MED ORDER — POTASSIUM CHLORIDE CRYS ER 20 MEQ PO TBCR: 40.0000 meq | EXTENDED_RELEASE_TABLET | Freq: Two times a day (BID) | ORAL | Status: DC

## 2022-10-02 MED ORDER — POTASSIUM CHLORIDE CRYS ER 20 MEQ PO TBCR
40.0000 meq | EXTENDED_RELEASE_TABLET | ORAL | Status: AC
  Administered 2022-10-02 – 2022-10-03 (×2): 40 meq via ORAL
  Filled 2022-10-02 (×2): qty 2

## 2022-10-02 MED ORDER — FUROSEMIDE 10 MG/ML IJ SOLN
60.0000 mg | Freq: Two times a day (BID) | INTRAMUSCULAR | Status: DC
  Administered 2022-10-02 – 2022-10-03 (×3): 60 mg via INTRAVENOUS
  Filled 2022-10-02 (×3): qty 6

## 2022-10-02 MED ORDER — ALBUTEROL SULFATE (2.5 MG/3ML) 0.083% IN NEBU
2.5000 mg | INHALATION_SOLUTION | Freq: Four times a day (QID) | RESPIRATORY_TRACT | Status: DC
  Administered 2022-10-02 (×3): 2.5 mg via RESPIRATORY_TRACT
  Filled 2022-10-02 (×3): qty 3

## 2022-10-02 MED ORDER — KETOCONAZOLE 2 % EX CREA
TOPICAL_CREAM | Freq: Two times a day (BID) | CUTANEOUS | Status: DC
  Filled 2022-10-02: qty 15

## 2022-10-02 MED ORDER — NYSTATIN 100000 UNIT/GM EX POWD
Freq: Two times a day (BID) | CUTANEOUS | Status: DC
  Filled 2022-10-02: qty 15

## 2022-10-02 MED ORDER — POTASSIUM CHLORIDE CRYS ER 20 MEQ PO TBCR
40.0000 meq | EXTENDED_RELEASE_TABLET | ORAL | Status: AC
  Administered 2022-10-02 (×2): 40 meq via ORAL
  Filled 2022-10-02 (×2): qty 2

## 2022-10-02 MED ORDER — DILTIAZEM HCL 25 MG/5ML IV SOLN
20.0000 mg | Freq: Once | INTRAVENOUS | Status: AC
  Administered 2022-10-02: 20 mg via INTRAVENOUS
  Filled 2022-10-02: qty 5

## 2022-10-02 MED ORDER — POTASSIUM CHLORIDE CRYS ER 20 MEQ PO TBCR: 40.0000 meq | EXTENDED_RELEASE_TABLET | Freq: Once | ORAL | Status: DC

## 2022-10-02 MED ORDER — POTASSIUM CHLORIDE CRYS ER 20 MEQ PO TBCR
40.0000 meq | EXTENDED_RELEASE_TABLET | Freq: Once | ORAL | Status: AC
  Administered 2022-10-02: 40 meq via ORAL
  Filled 2022-10-02: qty 2

## 2022-10-02 NOTE — Progress Notes (Signed)
    Patient Name: GRADON GERATY           DOB: [birth year]  MRN: [id number]      Admission Date: 09/26/2022  Attending Provider: Marinda Elk, MD  Primary Diagnosis: Acute respiratory failure with hypoxia [facility])   Level of care: Telemetry    CROSS COVER NOTE   Date of Service   10/02/2022   UNNAMED DAZEY, 72 y.o. male, was admitted on 09/26/2022 for Acute respiratory failure with hypoxia (HCC).    HPI/Events of Note   Atrial fibrillation RVR Hx paroxysmal A-fib on amiodarone and apixaban Patient expressed concern of "feeling A-fib."  Endorses palpitations, but denies chest discomfort, diaphoresis, lightheadedness, shortness of breath, nausea, vomiting.   EKG confirms A-fib RVR, HR 110-130s.  Given current admission for acute respiratory failure, will avoid beta-blocker. Patient given 15 mg IV Cardizem, achieved controlled HR 90-100   0300-patient resting well, but sustaining HR 120-130s.  Patient to be given 20 mg IV Cardizem.  Consider Cardizem gtt. if rate not well-controlled after second Cardizem bolus.   Interventions/ Plan   EKG IV Cardizem bolus x 2 Morning labs pending-replete electrolytes as needed Transferred to telemetry        Anthoney Harada, DNP, [employer] [facility] [place]

## 2022-10-02 NOTE — Progress Notes (Signed)
PROGRESS NOTE   Travis [name]  ZOX:[id number] DOB: [birth year] DOA: 09/26/2022 PCP: [name], MD     Brief Narrative:  72 year old male with past medical history of hypertension, atrial fibrillation on Eliquis, obesity, multiple myeloma presenting to [facility] emergency department via EMS with worsening cough and shortness of breath.  Upon evaluation in the emergency department patient continued to exhibit shortness of breath and was found to be tachypneic with evidence of developing right upper and right lower lobe infiltrates on chest x-ray.  Patient was found to be influenza A positive.  Hospitalist group was then called to assess the patient for admission the hospital.  Patient was treated with bronchodilator therapy and empiric antibiotic therapy with intravenous azithromycin and ceftriaxone.  Due to concerns for concurrent acute pulmonary edema patient was also placed on intravenous Lasix.   Assessment and Plan: * Acute respiratory failure with hypoxia (HCC) Thought to be multifactorial secondary to influenza A infection with suspected superimposed bacterial pneumonia and cardiogenic pulmonary edema Completing 7-day course of antibiotics today. Continuing to provide intravenous Lasix Bronchodilator therapy for associated bronchospasm. CT imaging of chest performed on 5/29 confirming multifocal pneumonia with bilateral pleural effusions Patient is unlikely to have superimposed pulmonary embolism due to ongoing Eliquis use  Infection due to novel influenza A virus Supportive care with supplemental oxygen and bronchodilator therapy as well as treatment with intravenous antibiotics for possible concurrent bacterial infection Remainder of assessment and plan as above  Paroxysmal atrial fibrillation with rapid ventricular response (HCC) Overnight patient began to exhibit bouts of rapid atrial fibrillation associated with palpitations  Patient was given several  doses of intravenous diltiazem and transferred to a telemetry unit.   I believe substantial hypokalemia in the setting of diuresis is what is prompting this rapid rate  Aggressively replacing potassium  continue amiodarone Continue apixaban for anticoagulation  Essential hypertension Continue lisinopril and hydralazine  Hypokalemia Persisting and quite severe due to ongoing diuretic use Continue to replace aggressive fluid with additional doses of potassium chloride Evaluating for concurrent hypomagnesemia  Monitoring potassium levels with serial chemistries.  Multiple myeloma (HCC) Evidence of myeloma related lesions of the sixth rib, T11, spinous process, left L1 and L2 pedicles that are unchanged compared to prior imaging Holding Pomalyst Patient follows with Dr. Leonides Schanz with oncology in the outpatient setting  Pancreatic cyst Identified incidentally on CT imaging of the chest Further nonurgent outpatient evaluation with MRI  OSA (obstructive sleep apnea) Continuing home regimen of CPAP nightly    Subjective:  Patient states that his cough has dramatically improved.  Associated shortness of breath has also substantially improved since yesterday.  Patient denies any associated chest pain.    Physical Exam:  Blood pressure 158/94, heart rate 103, respiratory rate 20, temperature 37.1 C.  Constitutional: Awake alert and oriented x3, no associated distress.   Skin: no rashes, no lesions, poor skin turgor noted. Eyes: Pupils are equally reactive to light.  No evidence of scleral icterus or conjunctival pallor.  ENMT: Moist mucous membranes noted.  Posterior pharynx clear of any exudate or lesions.   Respiratory: Bibasilar rales noted.  Minimal expiratory wheezing, improved compared to prior exams.  Normal respiratory effort. No accessory muscle use.  Cardiovascular: Regular rate and rhythm, no murmurs / rubs / gallops.  Notable edema of the distal bilateral lower extremities.   2+ pedal pulses. No carotid bruits.  Abdomen: Abdomen is protuberant but soft and nontender.  No evidence of intra-abdominal masses.  Positive bowel  sounds noted in all quadrants.   Musculoskeletal: No joint deformity upper and lower extremities. Good ROM, no contractures. Normal muscle tone.    Data Reviewed:  I have personally reviewed and interpreted labs, imaging.  Significant findings are   Lab Results  Component Value Date   WBC 3.5 (L) 10/02/2022   HGB 10.2 (L) 10/02/2022   HCT 31.7 (L) 10/02/2022   MCV 96.9 10/02/2022   PLT 199 10/02/2022     Code Status:  Full code.  Code status decision has been confirmed with: patient Family Communication: Discussed plan of care with wife at the bedside.    Severity of Illness:  The appropriate patient status for this patient is INPATIENT. Inpatient status is judged to be reasonable and necessary in order to provide the required intensity of service to ensure the patient's safety. The patient's presenting symptoms, physical exam findings, and initial radiographic and laboratory data in the context of their chronic comorbidities is felt to place them at high risk for further clinical deterioration. Furthermore, it is not anticipated that the patient will be medically stable for discharge from the hospital within 2 midnights of admission.   * I certify that at the point of admission it is my clinical judgment that the patient will require inpatient hospital care spanning beyond 2 midnights from the point of admission due to high intensity of service, high risk for further deterioration and high frequency of surveillance required.*  Time spent:  50 minutes  Author:  Marinda Elk MD

## 2022-10-02 NOTE — Progress Notes (Signed)
SATURATION QUALIFICATIONS: (This note is used to comply with regulatory documentation for home oxygen)  Patient Saturations on Room Air at Rest = 94%  Patient Saturations on Room Air while Ambulating = 91%  Patient Saturations on 0 Liters of oxygen while Ambulating = 91%  

## 2022-10-02 NOTE — Plan of Care (Signed)

## 2022-10-03 ENCOUNTER — Inpatient Hospital Stay (HOSPITAL_COMMUNITY): Payer: Medicare Other

## 2022-10-03 DIAGNOSIS — J189 Pneumonia, unspecified organism: Secondary | ICD-10-CM | POA: Diagnosis not present

## 2022-10-03 DIAGNOSIS — J09X2 Influenza due to identified novel influenza A virus with other respiratory manifestations: Secondary | ICD-10-CM | POA: Diagnosis not present

## 2022-10-03 DIAGNOSIS — I480 Paroxysmal atrial fibrillation: Secondary | ICD-10-CM | POA: Diagnosis not present

## 2022-10-03 DIAGNOSIS — J9601 Acute respiratory failure with hypoxia: Secondary | ICD-10-CM | POA: Diagnosis not present

## 2022-10-03 LAB — CBC WITH DIFFERENTIAL/PLATELET
Abs Immature Granulocytes: 0.01 10*3/uL (ref 0.00–0.07)
Basophils Absolute: 0.1 10*3/uL (ref 0.0–0.1)
Basophils Relative: 1 %
Eosinophils Absolute: 0.1 10*3/uL (ref 0.0–0.5)
Eosinophils Relative: 3 %
HCT: 29.6 % — ABNORMAL LOW (ref 39.0–52.0)
Hemoglobin: 9.4 g/dL — ABNORMAL LOW (ref 13.0–17.0)
Immature Granulocytes: 0 %
Lymphocytes Relative: 14 %
Lymphs Abs: 0.6 10*3/uL — ABNORMAL LOW (ref 0.7–4.0)
MCH: 30.9 pg (ref 26.0–34.0)
MCHC: 31.8 g/dL (ref 30.0–36.0)
MCV: 97.4 fL (ref 80.0–100.0)
Monocytes Absolute: 0.9 10*3/uL (ref 0.1–1.0)
Monocytes Relative: 21 %
Neutro Abs: 2.5 10*3/uL (ref 1.7–7.7)
Neutrophils Relative %: 61 %
Platelets: 230 10*3/uL (ref 150–400)
RBC: 3.04 MIL/uL — ABNORMAL LOW (ref 4.22–5.81)
RDW: 15.7 % — ABNORMAL HIGH (ref 11.5–15.5)
WBC: 4.1 10*3/uL (ref 4.0–10.5)
nRBC: 0 % (ref 0.0–0.2)

## 2022-10-03 LAB — COMPREHENSIVE METABOLIC PANEL
ALT: 32 U/L (ref 0–44)
AST: 23 U/L (ref 15–41)
Albumin: 2.6 g/dL — ABNORMAL LOW (ref 3.5–5.0)
Alkaline Phosphatase: 57 U/L (ref 38–126)
Anion gap: 6 (ref 5–15)
BUN: 15 mg/dL (ref 8–23)
CO2: 25 mmol/L (ref 22–32)
Calcium: 7.6 mg/dL — ABNORMAL LOW (ref 8.9–10.3)
Chloride: 109 mmol/L (ref 98–111)
Creatinine, Ser: 0.95 mg/dL (ref 0.61–1.24)
GFR, Estimated: >60 mL/min (ref >60)
Glucose, Bld: 105 mg/dL — ABNORMAL HIGH (ref 70–99)
Potassium: 3.7 mmol/L (ref 3.5–5.1)
Sodium: 140 mmol/L (ref 135–145)
Total Bilirubin: 0.6 mg/dL (ref 0.3–1.2)
Total Protein: 5.2 g/dL — ABNORMAL LOW (ref 6.5–8.1)

## 2022-10-03 LAB — C-REACTIVE PROTEIN: CRP: 10.1 mg/dL — ABNORMAL HIGH (ref <1.0)

## 2022-10-03 LAB — MAGNESIUM: Magnesium: 1.9 mg/dL (ref 1.7–2.4)

## 2022-10-03 LAB — PROCALCITONIN: Procalcitonin: <0.1 ng/mL

## 2022-10-03 LAB — BRAIN NATRIURETIC PEPTIDE: B Natriuretic Peptide: 237.7 pg/mL — ABNORMAL HIGH (ref 0.0–100.0)

## 2022-10-03 MED ORDER — FUROSEMIDE 20 MG PO TABS: 60.0000 mg | ORAL_TABLET | Freq: Every day | ORAL | 2 refills | Status: DC

## 2022-10-03 MED ORDER — CEFDINIR 300 MG PO CAPS: 300.0000 mg | ORAL_CAPSULE | Freq: Two times a day (BID) | ORAL | 0 refills | Status: AC

## 2022-10-03 MED ORDER — ALBUTEROL SULFATE HFA 108 (90 BASE) MCG/ACT IN AERS: 2.0000 | INHALATION_SPRAY | Freq: Four times a day (QID) | RESPIRATORY_TRACT | 0 refills | Status: AC | PRN

## 2022-10-03 MED ORDER — DOXYCYCLINE HYCLATE 100 MG PO CAPS: 100.0000 mg | ORAL_CAPSULE | Freq: Two times a day (BID) | ORAL | 0 refills | Status: AC

## 2022-10-03 MED ORDER — ALBUTEROL SULFATE (2.5 MG/3ML) 0.083% IN NEBU
2.5000 mg | INHALATION_SOLUTION | Freq: Three times a day (TID) | RESPIRATORY_TRACT | Status: DC
  Administered 2022-10-03: 2.5 mg via RESPIRATORY_TRACT
  Filled 2022-10-03: qty 3

## 2022-10-03 MED ORDER — NYSTATIN 100000 UNIT/GM EX POWD: Freq: Two times a day (BID) | CUTANEOUS | 0 refills | Status: AC

## 2022-10-03 NOTE — Discharge Summary (Addendum)
Physician Discharge Summary   Patient: Travis [name] MRN: [id number] DOB: Sep 07, 1950  Admit date:     09/26/2022  Discharge date: 10/03/22  Discharge Physician: Marinda Elk   PCP: [name], MD   Recommendations at discharge:    Please take all prescribed medications exactly as instructed including the remaining course of your antibiotic therapy including cefdinir and doxycycline. Your home-going regimen of furosemide has been adjusted and reduced to 60 mg by mouth once daily to help you be more compliant with this medication.  Please take it every single day. Please weigh yourself daily.  If you notice that your weight is increasing more than 2 pounds compared to the prior day or 5 pounds compared to the prior week take an additional dose of your furosemide and notify your provider. Please consume a low-sodium diet Please increase your physical activity as tolerated. Please maintain all outpatient follow-up appointments including follow-up with your your oncologist Dr. Leonides Schanz and your cardiologist. Please speak with your outpatient provider about considering getting a pulmonary function test. Please return to the emergency department if you develop worsening shortness of breath, fevers in excess of 100.4 F, weakness or inability to tolerate oral intake. Please note that you were noted to have a cyst in your pancreas, incidentally found on CT imaging during this hospitalization.  Please discuss this with your oncologist in the outpatient setting as to whether he feels further imaging needs to be pursued in the outpatient setting.  Discharge Diagnoses: Principal Problem:   Acute respiratory failure with hypoxia (HCC) Active Problems:   Infection due to novel influenza A virus   Paroxysmal atrial fibrillation with rapid ventricular response (HCC)   Acute on chronic diastolic congestive heart failure   Essential hypertension   Hypokalemia   Multiple myeloma (HCC)    Pancreatic cyst   OSA (obstructive sleep apnea)   CAP (community acquired pneumonia)  Resolved Problems:   * No resolved hospital problems. *   Hospital Course: 72 year old male with past medical history of hypertension, atrial fibrillation on Eliquis, obesity, multiple myeloma presenting to [facility] emergency department via EMS with worsening cough and shortness of breath.  Upon evaluation in the emergency department patient continued to exhibit shortness of breath and was found to be tachypneic with evidence of developing right upper and right lower lobe infiltrates on chest x-ray.  Patient was found to be influenza A positive.  Hospitalist group was then called to assess the patient for admission the hospital.  Patient was treated with bronchodilator therapy and empiric antibiotic therapy with intravenous azithromycin and ceftriaxone.  Due to concerns for concurrent acute pulmonary edema secondary to superimposed acute on chronic diastolic congestive heart failure patient was also placed on intravenous Lasix.  Patient experienced a prolonged hospital course secondary to persisting oxygen requirement.  However, with a combination of antibiotic therapy, bronchodilator therapy and aggressive intravenous diuretics patient did slowly improved.  Patient was eventually weaned off of supplemental oxygen and was able to ambulate throughout the medical unit without shortness of breath.  Hospital course was complicated by intermittent bouts of rapid atrial fibrillation.  Upon evaluation it was felt the patient's bouts of rapid atrial fibrillation were due to intermittent substantial hypokalemia from aggressive diuresis.  With aggressive replacement of potassium bouts of rapid atrial fibrillation esolved.  Patient's usual regimen of AV nodal blocking therapy with amiodarone did not need to be adjusted.  Finally, patient was incidentally found to have a pancreatic cyst on CT  imaging during this  hospitalization.  Patient was advised to inquire with his outpatient providers including his outpatient oncologist as to how this should be evaluated in the outpatient setting.  Patient continued to clinically improve and was discharged home in improved and stable condition on 10/03/2022.  A persisting right upper lobe area of infiltrate was noted on his final chest x-ray however clinically the patient appeared to be doing quite well.  It was felt that this persisting the trait was lagging behind the patient's clinical picture.  Patient is being discharged home with 3 additional days of oral antibiotics with Omnicef and doxycycline to complete a total of 10 days of antibiotics considering patient's immunocompromised state.  Patient will be following closely with his outpatient providers including Dr. Leonides Schanz his oncologist in the coming days, next follow-up appointment on 6/5.    Consultants: None Procedures performed: None  Disposition: Home Diet recommendation:  Discharge Diet Orders (From admission, onward)     Start     Ordered   10/03/22 0000  Diet - low sodium heart healthy        10/03/22 1229           Cardiac diet  DISCHARGE MEDICATION: Allergies as of 10/03/2022       Reactions   Linezolid Other (See Comments)   Burning tongue    Vancomycin Rash   Other Reaction(s): rash, itching, fever, chills        Medication List     TAKE these medications    acyclovir 400 MG tablet Commonly known as: ZOVIRAX Take 1 tablet (400 mg total) by mouth 2 (two) times daily.   albuterol 108 (90 Base) MCG/ACT inhaler Commonly known as: VENTOLIN HFA Inhale 2 puffs into the lungs every 6 (six) hours as needed for wheezing or shortness of breath.   amiodarone 200 MG tablet Commonly known as: PACERONE Take 1 tablet (200 mg total) by mouth daily. Start after finishing 40 mg twice daily dose of  days What changed: additional instructions   apixaban 5 MG Tabs tablet Commonly known as:  ELIQUIS Take 1 tablet (5 mg total) by mouth 2 (two) times daily.   cefdinir 300 MG capsule Commonly known as: OMNICEF Take 1 capsule (300 mg total) by mouth 2 (two) times daily for 3 days.   cholecalciferol 25 MCG (1000 UNIT) tablet Commonly known as: VITAMIN D3 Take 1,000 Units by mouth daily.   dexamethasone 4 MG tablet Commonly known as: DECADRON Take 40 mg by mouth every 7 (seven) days. Take 40 mg by mouth once a day in the morning of chemo on Wednesdays- or unless otherwise instructed   doxycycline 100 MG capsule Commonly known as: VIBRAMYCIN Take 1 capsule (100 mg total) by mouth 2 (two) times daily for 3 days.   FISH OIL PO Take 1 Capful by mouth daily.   fluticasone 50 MCG/ACT nasal spray Commonly known as: FLONASE Place 1 spray into both nostrils daily as needed for allergies or rhinitis.   furosemide 20 MG tablet Commonly known as: LASIX Take 3 tablets (60 mg total) by mouth daily. If your weight increases by more than 2 pounds compared to the day prior or 5 pounds over the past week take an additional dose that day. What changed:  medication strength how much to take additional instructions   gabapentin 300 MG capsule Commonly known as: NEURONTIN TAKE TWO CAPSULES BY MOUTH TWICE A DAY   hydrALAZINE 50 MG tablet Commonly known as: APRESOLINE Take 50 mg  by mouth 3 (three) times daily.   lisinopril 40 MG tablet Commonly known as: ZESTRIL TAKE ONE TABLET BY MOUTH ONE TIME DAILY   metolazone 2.5 MG tablet Commonly known as: ZAROXOLYN Take 2.5 mg by mouth every other day.   nystatin powder Commonly known as: MYCOSTATIN/NYSTOP Apply topically 2 (two) times daily for 13 days. Apply under both arms.   pomalidomide 2 MG capsule Commonly known as: POMALYST Take 1 capsule (2 mg total) by mouth daily. Take one tablet (2 mg by mouth) daily for 21 days, then take 7 days off [employer] auth #[id number]  Date obtained 09/17/22   potassium chloride SA 20 MEQ  tablet Commonly known as: KLOR-CON M Take 2 tablets (40 mEq total) by mouth daily. Take an extra tablet on the day you take the Metolazone (60 meq total) What changed:  when to take this additional instructions   sodium chloride 0.65 % Soln nasal spray Commonly known as: OCEAN Place 1 spray into both nostrils as needed for congestion.   terbinafine 250 MG tablet Commonly known as: LAMISIL Take 250 mg by mouth daily.   VITAMIN B-12 PO Take 1,000 mcg by mouth daily.        Follow-up Information     [name], MD. Schedule an appointment as soon as possible for a visit in 2 week(s).   Specialty: Internal Medicine Contact information: [address] [place] [address] [phone]         Jaci Standard, MD. Nyra Capes on 10/06/2022.   Specialty: Hematology and Oncology Contact information: [address] [place] [address] [phone]         Pricilla Riffle, MD. Schedule an appointment as soon as possible for a visit in 4 week(s).   Specialty: Cardiology Contact information: [address] [place] [address] [phone]                 Discharge Exam: Ceasar Mons Weights   10/01/22 0500 10/02/22 0500 10/03/22 0558  Weight: 109.9 kg 106.1 kg 109.7 kg    Constitutional: Awake alert and oriented x3, no associated distress.   Respiratory: Scattered rhonchi bilaterally, otherwise, clear to auscultation bilaterally, no wheezing, no crackles. Normal respiratory effort. No accessory muscle use.  Cardiovascular: Regular rate and rhythm, no murmurs / rubs / gallops. No extremity edema. 2+ pedal pulses. No carotid bruits.  Abdomen: Abdomen is soft and nontender.  No evidence of intra-abdominal masses.  Positive bowel sounds noted in all quadrants.   Musculoskeletal: No joint deformity upper and lower extremities. Good ROM, no contractures. Normal muscle tone.     Condition at discharge: fair  The results of  significant diagnostics from this hospitalization (including imaging, microbiology, ancillary and laboratory) are listed below for reference.   Imaging Studies: DG Chest 1 View  Result Date: 10/03/2022 CLINICAL DATA:  72 year old male with history of pneumonia. EXAM: CHEST  1 VIEW COMPARISON:  Chest x-ray 09/28/2022. FINDINGS: Diffuse interstitial prominence, widespread peribronchial cuffing and patchy ill-defined opacities are noted throughout the lungs bilaterally, most confluent in the right upper lobe where the extent of airspace consolidation appears slightly increased compared to the recent prior study. No definite pleural effusions. No pneumothorax. No cephalization of the pulmonary vasculature. Heart size is normal. The patient is rotated to the left on today's exam, resulting in distortion of the mediastinal contours and reduced diagnostic sensitivity and specificity for mediastinal pathology. Orthopedic fixation hardware throughout the cervical spine. IMPRESSION: 1.  Severe multilobar bilateral bronchopneumonia, most confluent in the right upper lobe, which appears progressive compared to the prior study, as above. Electronically Signed   By: Trudie Reed M.D.   On: 10/03/2022 08:38   CT CHEST W CONTRAST  Result Date: 09/30/2022 CLINICAL DATA:  Pneumonia with complications suspected and shortness of breath. Flu positive. EXAM: CT CHEST WITH CONTRAST TECHNIQUE: Multidetector CT imaging of the chest was performed during intravenous contrast administration. RADIATION DOSE REDUCTION: This exam was performed according to the departmental dose-optimization program which includes automated exposure control, adjustment of the mA and/or kV according to patient size and/or use of iterative reconstruction technique. CONTRAST:  75mL OMNIPAQUE IOHEXOL 300 MG/ML  SOLN COMPARISON:  Portable chest 09/28/2022, AP Lat chest 09/26/2022, CTA chest 06/29/2022, CTA chest 03/15/2022. FINDINGS: Cardiovascular: The  heart is upper limit of normal in size, smaller than on the last CTA. No pericardial effusion is seen. Chronic prominence of the pulmonary trunk indicating arterial hypertension is again noted, measuring 5.6 cm. Superior pulmonary veins are slightly prominent but no more prominent than previously. There are no visible significant coronary calcifications. There is minimal scattered aortic calcific plaque of the descending segment. The aorta and great vessels are otherwise clear. There is no aortic aneurysm, stenosis or dissection. Mediastinum/Nodes: There are mildly prominent subcarinal and bilateral hilar lymph nodes both up to 1.2 cm short axis, but the adenopathy has improved since 06/29/2022 and is less prominent. A few right paratracheal nodes of similar size are also smaller than previously as well as borderline prominent left-sided prevascular nodes. There is no new or worsening adenopathy, no bulky or encasing nodes. Axillary spaces are clear. The thyroid gland, the thoracic trachea, main bronchi and thoracic esophagus are unremarkable. Lungs/Pleura: There are small layering pleural effusions on the right greater than left, similar to the last CT. There are mild features of subpleural interstitial edema in the lung bases but this is not seen elsewhere. There is interval increase patchy consolidation in the left lower lobe, most dense alongside the effusion. Additional patchy airspace disease noted in the right greater than left upper lobes but less dense than previously, with patchy ground-glass and tree-in-bud interstitial changes in the base of the right upper lobe and in the right lower lobe, with minimal consolidation in the posterior right middle lobe. Findings favor multifocal pneumonia. An aspiration component is not excluded. Upper Abdomen: No acute findings. The liver is mildly steatotic. A thin walled homogeneous cyst, Hounsfield density of -1, is again noted alongside the pancreatic tail and may  represent a pseudocyst, pancreatic cyst or mucinous neoplasm. This was first seen on the CTA chest from 03/15/2022. The last abdomen and pelvis dedicated CT was 05/14/2015 and this was not present at that time. MRI without and with contrast is recommended on a nonemergent basis to further characterize this entity. Musculoskeletal: History of myeloma. Expansile lesion again noted in the posterior right sixth rib and T11 spinous process. There is additional lytic lesion again in the left L1 and left L2 pedicles. A dorsal fusion construct with T2 corpectomy and interbody hardware is again also noted spanning T1-4 and seems unchanged. There is bridging enthesopathy multiple other thoracic levels. No new abnormality. IMPRESSION: 1. Multifocal airspace disease in the right greater than left lungs, with interval increase in consolidation in the left lower lobe. Findings favor multifocal pneumonia. An aspiration component is not excluded. 2. Small right greater than left pleural effusions, similar to the last CT. 3. Mild subpleural interstitial edema in  the lung bases. Mild distention of the pulmonary veins. 4. Chronic prominence of the pulmonary trunk indicating arterial hypertension. 5. Aortic atherosclerosis.  Borderline cardiomegaly. 6. Mediastinal and hilar adenopathy, but has improved from the last CTA. 7. History of myeloma with expansile lesions in the posterior right sixth rib, T11 spinous process, left L1 and L2 pedicles. No appreciable change from the last CT. 8. Thin walled 2.8 cm homogeneous cystic lesion alongside the pancreatic tail, Hounsfield density -1, Hounsfield density of -1, not present on the last CT abdomen and pelvis in 2017. Nonemergent MRI without and with contrast is recommended to further characterize this. 9. Hepatic steatosis. Aortic Atherosclerosis (ICD10-I70.0). Electronically Signed   By: Almira Bar M.D.   On: 09/30/2022 04:17   DG CHEST PORT 1 VIEW  Result Date: 09/28/2022 CLINICAL  DATA:  Dyspnea. EXAM: PORTABLE CHEST 1 VIEW COMPARISON:  09/26/2022. FINDINGS: Unchanged hazy and consolidative opacities right upper and lower lobes, as well as the left lower lung, again suspicious for multifocal pneumonia. Stable cardiac and mediastinal contours. No pleural effusion or pneumothorax. IMPRESSION: Unchanged hazy and consolidative opacities right upper and lower lobes, as well as the left lower lung, again suspicious for multifocal pneumonia. Electronically Signed   By: Orvan Falconer M.D.   On: 09/28/2022 14:53   DG Chest 2 View  Result Date: 09/26/2022 CLINICAL DATA:  Shortness of breath.  Productive cough and chills. EXAM: CHEST - 2 VIEW COMPARISON:  Chest radiograph dated July 01, 2022 FINDINGS: The heart is enlarged. Bilateral lower lobe hazy opacities concerning for pneumonia. Differential may also include pulmonary edema. Small bilateral pleural effusions. Advanced osteopenia and posterior spinal fusion at cervicothoracic junction. IMPRESSION: Bilateral lower lobe hazy opacities concerning for pneumonia. Differential may also include pulmonary edema. Small bilateral pleural effusions. Electronically Signed   By: Larose Hires D.O.   On: 09/26/2022 16:52   ECHOCARDIOGRAM COMPLETE  Result Date: 09/08/2022    ECHOCARDIOGRAM REPORT   Patient Name:   KHILAN GILLISON [facility] Date of Exam: 09/08/2022 Medical Rec #:  [id number]        Height:       70.0 in Accession #:    [id number]       Weight:       248.0 lb Date of Birth:  Nov 08, 1950       BSA:          2.287 m Patient Age:    71 years         BP:           161/72 mmHg Patient Gender: M                HR:           56 bpm. Exam Location:  Outpatient Procedure: 2D Echo, Cardiac Doppler and Color Doppler Indications:    chemo  History:        Patient has prior history of Echocardiogram examinations.                 Arrythmias:Atrial Fibrillation; Risk Factors:Hypertension.  Sonographer:    Mike Gip Referring Phys: [id number] IRENE T THAYIL  IMPRESSIONS  1. Left ventricular ejection fraction, by estimation, is 60 to 65%. The left ventricle has normal function. The left ventricle has no regional wall motion abnormalities. There is severe concentric left ventricular hypertrophy. Left ventricular diastolic  parameters are indeterminate.  2. Right ventricular systolic function is normal. The right ventricular size is mildly enlarged. There is normal pulmonary artery systolic  pressure.  3. Left atrial size was severely dilated.  4. Right atrial size was mildly dilated.  5. The mitral valve is normal in structure. No evidence of mitral valve regurgitation. No evidence of mitral stenosis.  6. The aortic valve is normal in structure. Aortic valve regurgitation is not visualized. No aortic stenosis is present.  7. There is mild dilatation of the ascending aorta, measuring 37 mm.  8. The inferior vena cava is dilated in size with <50% respiratory variability, suggesting right atrial pressure of 15 mmHg. FINDINGS  Left Ventricle: Left ventricular ejection fraction, by estimation, is 60 to 65%. The left ventricle has normal function. The left ventricle has no regional wall motion abnormalities. The global longitudinal strain is normal despite suboptimal segment tracking. The left ventricular internal cavity size was normal in size. There is severe concentric left ventricular hypertrophy. Left ventricular diastolic parameters are indeterminate. Right Ventricle: The right ventricular size is mildly enlarged. No increase in right ventricular wall thickness. Right ventricular systolic function is normal. There is normal pulmonary artery systolic pressure. The tricuspid regurgitant velocity is 2.12  m/s, and with an assumed right atrial pressure of 8 mmHg, the estimated right ventricular systolic pressure is 26.0 mmHg. Left Atrium: Left atrial size was severely dilated. Right Atrium: Right atrial size was mildly dilated. Pericardium: There is no evidence of pericardial  effusion. Presence of epicardial fat layer. Mitral Valve: The mitral valve is normal in structure. No evidence of mitral valve regurgitation. No evidence of mitral valve stenosis. Tricuspid Valve: The tricuspid valve is normal in structure. Tricuspid valve regurgitation is trivial. No evidence of tricuspid stenosis. Aortic Valve: The aortic valve is normal in structure. Aortic valve regurgitation is not visualized. No aortic stenosis is present. Pulmonic Valve: The pulmonic valve was not well visualized. Pulmonic valve regurgitation is trivial. No evidence of pulmonic stenosis. Aorta: There is mild dilatation of the ascending aorta, measuring 37 mm. Venous: The inferior vena cava is dilated in size with less than 50% respiratory variability, suggesting right atrial pressure of 15 mmHg. IAS/Shunts: No atrial level shunt detected by color flow Doppler.  LEFT VENTRICLE PLAX 2D LVIDd:         4.70 cm      Diastology LVIDs:         3.00 cm      LV e' medial:    6.64 cm/s LV PW:         1.60 cm      LV E/e' medial:  13.8 LV IVS:        1.50 cm      LV e' lateral:   12.10 cm/s LVOT diam:     2.00 cm      LV E/e' lateral: 7.6 LV SV:         84 LV SV Index:   37 LVOT Area:     3.14 cm  LV Volumes (MOD) LV vol d, MOD A2C: 177.0 ml LV vol d, MOD A4C: 130.0 ml LV vol s, MOD A2C: 63.8 ml LV vol s, MOD A4C: 46.7 ml LV SV MOD A2C:     113.2 ml LV SV MOD A4C:     130.0 ml LV SV MOD BP:      100.1 ml RIGHT VENTRICLE             IVC RV Basal diam:  4.90 cm     IVC diam: 2.60 cm RV S prime:     19.50 cm/s TAPSE (M-mode): 2.8 cm  LEFT ATRIUM              Index        RIGHT ATRIUM           Index LA diam:        4.50 cm  1.97 cm/m   RA Area:     24.50 cm LA Vol (A2C):   114.0 ml 49.84 ml/m  RA Volume:   77.90 ml  34.06 ml/m LA Vol (A4C):   94.7 ml  41.41 ml/m LA Biplane Vol: 118.0 ml 51.59 ml/m  AORTIC VALVE             PULMONIC VALVE LVOT Vmax:   131.00 cm/s PR End Diast Vel: 4.24 msec LVOT Vmean:  77.200 cm/s LVOT VTI:    0.266  m  AORTA Ao Root diam: 3.50 cm Ao Asc diam:  3.70 cm MITRAL VALVE               TRICUSPID VALVE MV Area (PHT): 3.98 cm    TR Peak grad:   18.0 mmHg MV Decel Time: 191 msec    TR Vmax:        212.00 cm/s MV E velocity: 91.95 cm/s MV A velocity: 40.90 cm/s  SHUNTS MV E/A ratio:  2.25        Systemic VTI:  0.27 m                            Systemic Diam: 2.00 cm Lavona Mound Tobb DO Electronically signed by Thomasene Ripple DO Signature Date/Time: 09/08/2022/1:55:03 PM    Final     Microbiology: Results for orders placed or performed during the hospital encounter of 09/26/22  Resp panel by RT-PCR (RSV, Flu A&B, Covid) Anterior Nasal Swab     Status: Abnormal   Collection Time: 09/26/22  3:52 PM   Specimen: Anterior Nasal Swab  Result Value Ref Range Status   SARS Coronavirus 2 by RT PCR NEGATIVE NEGATIVE Final    Comment: (NOTE) SARS-CoV-2 target nucleic acids are NOT DETECTED.  The SARS-CoV-2 RNA is generally detectable in upper respiratory specimens during the acute phase of infection. The lowest concentration of SARS-CoV-2 viral copies this assay can detect is 138 copies/mL. A negative result does not preclude SARS-Cov-2 infection and should not be used as the sole basis for treatment or other patient management decisions. A negative result may occur with  improper specimen collection/handling, submission of specimen other than nasopharyngeal swab, presence of viral mutation(s) within the areas targeted by this assay, and inadequate number of viral copies(<138 copies/mL). A negative result must be combined with clinical observations, patient history, and epidemiological information. The expected result is Negative.  Fact Sheet for Patients:  BloggerCourse.com  Fact Sheet for Healthcare Providers:  SeriousBroker.it  This test is no t yet approved or cleared by the [place] FDA and  has been authorized for detection and/or diagnosis of  SARS-CoV-2 by FDA under an Emergency Use Authorization (EUA). This EUA will remain  in effect (meaning this test can be used) for the duration of the COVID-19 declaration under Section 564(b)(1) of the Act, 21 U.S.C.section 360bbb-3(b)(1), unless the authorization is terminated  or revoked sooner.       Influenza A by PCR POSITIVE (A) NEGATIVE Final   Influenza B by PCR NEGATIVE NEGATIVE Final    Comment: (NOTE) The Xpert Xpress SARS-CoV-2/FLU/RSV plus assay is intended as an aid in the diagnosis of influenza from  Nasopharyngeal swab specimens and should not be used as a sole basis for treatment. Nasal washings and aspirates are unacceptable for Xpert Xpress SARS-CoV-2/FLU/RSV testing.  Fact Sheet for Patients: BloggerCourse.com  Fact Sheet for Healthcare Providers: SeriousBroker.it  This test is not yet approved or cleared by the [place] FDA and has been authorized for detection and/or diagnosis of SARS-CoV-2 by FDA under an Emergency Use Authorization (EUA). This EUA will remain in effect (meaning this test can be used) for the duration of the COVID-19 declaration under Section 564(b)(1) of the Act, 21 U.S.C. section 360bbb-3(b)(1), unless the authorization is terminated or revoked.     Resp Syncytial Virus by PCR NEGATIVE NEGATIVE Final    Comment: (NOTE) Fact Sheet for Patients: BloggerCourse.com  Fact Sheet for Healthcare Providers: SeriousBroker.it  This test is not yet approved or cleared by the [place] FDA and has been authorized for detection and/or diagnosis of SARS-CoV-2 by FDA under an Emergency Use Authorization (EUA). This EUA will remain in effect (meaning this test can be used) for the duration of the COVID-19 declaration under Section 564(b)(1) of the Act, 21 U.S.C. section 360bbb-3(b)(1), unless the authorization is terminated  or revoked.  Performed at [facility], [address], [place], [place] [address]   Respiratory (~20 pathogens) panel by PCR     Status: Abnormal   Collection Time: 09/30/22  5:39 AM   Specimen: Nasopharyngeal Swab; Respiratory  Result Value Ref Range Status   Adenovirus NOT DETECTED NOT DETECTED Final   Coronavirus 229E NOT DETECTED NOT DETECTED Final    Comment: (NOTE) The Coronavirus on the Respiratory Panel, DOES NOT test for the novel  Coronavirus (2019 nCoV)    Coronavirus HKU1 NOT DETECTED NOT DETECTED Final   Coronavirus NL63 NOT DETECTED NOT DETECTED Final   Coronavirus OC43 NOT DETECTED NOT DETECTED Final   Metapneumovirus NOT DETECTED NOT DETECTED Final   Rhinovirus / Enterovirus NOT DETECTED NOT DETECTED Final   Influenza A H3 DETECTED (A) NOT DETECTED Final   Influenza B NOT DETECTED NOT DETECTED Final   Parainfluenza Virus 1 NOT DETECTED NOT DETECTED Final   Parainfluenza Virus 2 NOT DETECTED NOT DETECTED Final   Parainfluenza Virus 3 NOT DETECTED NOT DETECTED Final   Parainfluenza Virus 4 NOT DETECTED NOT DETECTED Final   Respiratory Syncytial Virus NOT DETECTED NOT DETECTED Final   Bordetella pertussis NOT DETECTED NOT DETECTED Final   Bordetella Parapertussis NOT DETECTED NOT DETECTED Final   Chlamydophila pneumoniae NOT DETECTED NOT DETECTED Final   Mycoplasma pneumoniae NOT DETECTED NOT DETECTED Final    Comment: Performed at [facility], [address] [place], [place] [address]    Labs: CBC: Recent Labs  Lab 09/28/22 [phone] 09/29/22 0459 10/01/22 0503 10/02/22 0516 10/03/22 0404  WBC 2.7* 1.9* 2.3* 3.5* 4.1  NEUTROABS 2.0 1.1* 1.5* 2.2 2.5  HGB 9.7* 10.0* 9.4* 10.2* 9.4*  HCT 30.4* 31.7* 29.8* 31.7* 29.6*  MCV 98.1 98.8 98.0 96.9 97.4  PLT 113* 115* 147* 199 230   Basic Metabolic Panel: Recent Labs  Lab 09/29/22 0459 09/30/22 0449 10/01/22 0503 10/02/22 0516 10/02/22 1429 10/03/22 0404  NA 140 137 138 138 139  140  K 2.9* 3.0* 3.2* 2.8* 3.1* 3.7  CL 105 101 105 107 103 109  CO2 28 29 26 25 26 25   GLUCOSE 90 98 100* 99 140* 105*  BUN 18 17 14 11 13 15   CREATININE 0.77 0.72 0.72 0.63 0.83 0.95  CALCIUM 7.9*  7.6* 7.6* 7.7* 8.2* 7.6*  MG 2.0 1.8 1.8 1.9  --  1.9  PHOS  --  2.8  --   --   --   --    Liver Function Tests: Recent Labs  Lab 09/30/22 0449 10/01/22 0503 10/02/22 0516 10/03/22 0404  AST 17 15 18 23   ALT 25 23 26  32  ALKPHOS 57 49 56 57  BILITOT 0.7 0.8 0.7 0.6  PROT 5.3* 5.1* 5.4* 5.2*  ALBUMIN 2.8* 2.6* 2.7* 2.6*   CBG: No results for input(s): "GLUCAP" in the last 168 hours.  Discharge time spent: greater than 30 minutes.  Signed: Marinda Elk, MD [facility] 10/03/2022

## 2022-10-03 NOTE — Discharge Instructions (Addendum)
Please take all prescribed medications exactly as instructed including the remaining course of your antibiotic therapy including cefdinir and doxycycline. Your home-going regimen of furosemide has been adjusted and reduced to 60 mg by mouth once daily to help you be more compliant with this medication.  Please take it every single day. Please weigh yourself daily.  If you notice that your weight is increasing more than 2 pounds compared to the prior day or 5 pounds compared to the prior week take an additional dose of your furosemide and notify your provider. Please consume a low-sodium diet Please increase your physical activity as tolerated. Please maintain all outpatient follow-up appointments including follow-up with your your oncologist Dr. Leonides Schanz and your cardiologist. Please speak with your outpatient provider about considering getting a pulmonary function test. Please return to the emergency department if you develop worsening shortness of breath, fevers in excess of 100.4 F, weakness or inability to tolerate oral intake. Please note that you were noted to have a cyst in your pancreas, incidentally found on CT imaging during this hospitalization.  Please discuss this with your oncologist in the outpatient setting as to whether he feels further imaging needs to be pursued in the outpatient setting.

## 2022-10-03 NOTE — Progress Notes (Signed)
Patient able to ambulate entire 4W unit, room air, maintaining O2 sats at 90-93%. Resting O2 sat 94%.

## 2022-10-04 ENCOUNTER — Telehealth: Payer: Self-pay | Admitting: *Deleted

## 2022-10-04 ENCOUNTER — Ambulatory Visit: Payer: Medicare Other | Attending: Radiation Oncology

## 2022-10-04 ENCOUNTER — Other Ambulatory Visit: Payer: Self-pay | Admitting: *Deleted

## 2022-10-04 DIAGNOSIS — C9000 Multiple myeloma not having achieved remission: Secondary | ICD-10-CM | POA: Insufficient documentation

## 2022-10-04 DIAGNOSIS — M6281 Muscle weakness (generalized): Secondary | ICD-10-CM | POA: Diagnosis not present

## 2022-10-04 DIAGNOSIS — S22000A Wedge compression fracture of unspecified thoracic vertebra, initial encounter for closed fracture: Secondary | ICD-10-CM | POA: Diagnosis not present

## 2022-10-04 DIAGNOSIS — R278 Other lack of coordination: Secondary | ICD-10-CM | POA: Diagnosis not present

## 2022-10-04 DIAGNOSIS — R293 Abnormal posture: Secondary | ICD-10-CM | POA: Diagnosis not present

## 2022-10-04 DIAGNOSIS — G8929 Other chronic pain: Secondary | ICD-10-CM | POA: Insufficient documentation

## 2022-10-04 DIAGNOSIS — M542 Cervicalgia: Secondary | ICD-10-CM | POA: Insufficient documentation

## 2022-10-04 DIAGNOSIS — M25511 Pain in right shoulder: Secondary | ICD-10-CM | POA: Insufficient documentation

## 2022-10-04 MED ORDER — DEXAMETHASONE 4 MG PO TABS: 40.0000 mg | ORAL_TABLET | ORAL | 2 refills | Status: DC

## 2022-10-04 NOTE — Therapy (Signed)
OUTPATIENT PHYSICAL THERAPY ONCOLOGY TREATMENT  Patient Name: Travis [name] MRN: [id number] DOB:[birth year], 72 y.o., male Today's Date: 10/04/2022  END OF SESSION:  PT End of Session - 10/04/22 1102     Visit Number 2    Number of Visits 16    Date for PT Re-Evaluation 11/04/22    PT Start Time 1103    PT Stop Time 1153    PT Time Calculation (min) 50 min    Activity Tolerance Patient tolerated treatment well    Behavior During Therapy WFL for tasks assessed/performed             Past Medical History:  Diagnosis Date   A-fib (HCC)    Arthritis    "comes and goes; mostly in hands, occasionally elbow" (04/05/2017)   Complication of anesthesia    "just wanted to sleep alot  for couple days S/P VARICOCELE OR" (04/05/2017)   DVT (deep venous thrombosis) (HCC)    LLE   Dysrhythmia    PVCs    GERD (gastroesophageal reflux disease)    History of hiatal hernia    History of radiation therapy    Thoracic Spine- 07/13/22-07/23/22-Dr. Antony Blackbird   Hypertension    Impaired glucose tolerance    Multiple myeloma (HCC)    Obesity (BMI 30-39.9) 07/26/2016   OSA on CPAP 07/26/2016   Mild with AHI 12.5/hr now on CPAP at 14cm H2O   PAF (paroxysmal atrial fibrillation) (HCC)    s/p PVI Ablation in 2018 // Flecainide Rx // Echo 8/21: EF 60-65, no RWMA, mild LVH, normal RV SF, moderate RVE, mild BAE, trivial MR, mild dilation of aortic root (40 mm)   Pneumonia    "may have had walking pneumonia a few years ago" (04/05/2017)   Pre-diabetes    Thrombophlebitis    Past Surgical History:  Procedure Laterality Date   ATRIAL FIBRILLATION ABLATION  04/05/2017   ATRIAL FIBRILLATION ABLATION N/A 04/05/2017   Procedure: ATRIAL FIBRILLATION ABLATION;  Surgeon: [name], MD;  Location: [facility];  Service: Cardiovascular;  Laterality: N/A;   BONE BIOPSY Right 12/17/2021   Procedure: BONE BIOPSY;  Surgeon: [name], MD;  Location: [facility];  Service:  Orthopedics;  Laterality: Right;   COMPLETE RIGHT HIP REPLACMENT  01/19/2019   EVALUATION UNDER ANESTHESIA WITH HEMORRHOIDECTOMY N/A 02/24/2017   Procedure: EXAM UNDER ANESTHESIA WITH  HEMORRHOIDECTOMY;  Surgeon: [name], MD;  Location: [facility];  Service: General;  Laterality: N/A;   EXCISIONAL TOTAL HIP ARTHROPLASTY WITH ANTIBIOTIC SPACERS Right 09/25/2020   Procedure: EXCISIONAL TOTAL HIP ARTHROPLASTY WITH ANTIBIOTIC SPACERS;  Surgeon: [name], MD;  Location: [facility];  Service: Orthopedics;  Laterality: Right;   FLEXIBLE SIGMOIDOSCOPY N/A 04/15/2015   Procedure:  UNSEDATED FLEXIBLE SIGMOIDOSCOPY;  Surgeon: [name], MD;  Location: [facility];  Service: Endoscopy;  Laterality: N/A;   HUMERUS IM NAIL Right 12/17/2021   Procedure: INTRAMEDULLARY (IM) NAIL HUMERAL;  Surgeon: [name], MD;  Location: [facility];  Service: Orthopedics;  Laterality: Right;   KNEE ARTHROSCOPY Left ~ 2016   POSTERIOR CERVICAL FUSION/FORAMINOTOMY N/A 06/04/2022   Procedure: Thoracic one - Thoracic Four Posterior lateral arthrodesis/ fusion and Thoracic two Corpectomy;  Surgeon: [name], MD;  Location: [facility];  Service: Neurosurgery;  Laterality: N/A;   REIMPLANTATION OF TOTAL HIP Right 12/25/2020   Procedure: REIMPLANTATION/REVISION OF RIGHT TOTAL HIP VERSUS REPEAT IRRIGATION AND DEBRIDEMENT;  Surgeon: [name], MD;  Location: [facility];  Service:  Orthopedics;  Laterality: Right;   TESTICLE SURGERY  1988   VARICOCELE   TONSILLECTOMY     VARICOSE VEIN SURGERY Bilateral    Patient Active Problem List   Diagnosis Date Noted   Pancreatic cyst 09/30/2022   Infection due to novel influenza A virus 09/29/2022   Persistent atrial fibrillation (HCC) 07/15/2022   Acute on chronic diastolic heart failure (HCC) 07/03/2022   Pulmonary infiltrates 07/01/2022   Acute respiratory failure with hypoxia (HCC) 07/01/2022   Hyponatremia 06/29/2022   History of thoracic surgery 06/29/2022    Acute DVT of left tibial vein (HCC) 06/21/2022   Hypokalemia 06/21/2022   Vitamin D deficiency 05/30/2022   Other constipation 05/29/2022   Pathologic compression fracture of spine, initial encounter (HCC) 05/29/2022   Cord compression (HCC) 05/28/2022   Degenerative cervical disc 05/14/2022   Hypercoagulable state due to persistent atrial fibrillation (HCC) 01/11/2022   Multiple myeloma (HCC) 12/10/2021   Medication monitoring encounter 02/27/2021   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   Status post peripherally inserted central catheter (PICC) central line placement 11/06/2020   Drug rash with eosinophilia and systemic symptoms syndrome 11/06/2020   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   PAF (paroxysmal atrial fibrillation) (HCC)    GERD (gastroesophageal reflux disease) 03/29/2018   Hiatal hernia 03/29/2018   History of DVT (deep vein thrombosis) 03/29/2018   CAP (community acquired pneumonia) 03/29/2018   Osteoarthritis 03/29/2018   Neuropathy 08/29/2017   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   Paroxysmal atrial fibrillation with rapid ventricular response (HCC) 04/05/2017   Prolapsed internal hemorrhoids, grade 4, s/p ligation/pexy/hemorrhoidectomy x 2 02/24/2017 02/24/2017   OSA (obstructive sleep apnea) 07/26/2016   Obesity (BMI 30-39.9) 07/26/2016   Atrial fibrillation with RVR (HCC)    Essential hypertension    Chest pain at rest     PCP: Eleanora Neighbor, MD  REFERRING PROVIDER: Antony Blackbird, MD  REFERRING DIAG: Multiple Myeloma  THERAPY DIAG:  Compression fracture of thoracic vertebra, unspecified thoracic vertebral level, initial encounter (HCC)  Multiple myeloma not having achieved remission (HCC)  Muscle weakness (generalized)  Neck pain  Abnormal posture  ONSET DATE: 2023  Rationale for Evaluation and Treatment: Rehabilitation  SUBJECTIVE:                                                                                                                                                                                            SUBJECTIVE STATEMENT:  Just got out of the hospital yesterday for pneumonia. I feel much better, but I am still a little winded. I checked my O2 sats this am and they were  93 today.  PERTINENT HISTORY:  Travis [name] 72 y.o. male with medical history significant for IgA Kappa Multiple Myeloma. He was diagnosed with smouldering myeloma in 2018 . Last July he developed right arm pain due to a pathologic fx and he underwent radiation and fixation. In early Feb 2024 he had radiation for pathologic T2 compression fx followed by a T1-T4 fusion. PAIN:  Are you having pain? Yes NPRS scale: 3/10 presently increasing to 8/10 Pain location: neck, upper back muscles between shoulders Pain orientation: Bilateral  PAIN TYPE: aching, dull, and tight Pain description: constant  Aggravating factors: worse with bending,twisting, trying to lift head Relieving factors: walking is good but gets tired,   PRECAUTIONS: Multiple Myeloma with mets to R ribs, R humerus with ORIF for pathological fracture 12/17/21. Lesions have also been found in R rib and also in his skull. Pathologic fx T2 with fusion T1-4 on 06/04/22, Bilateral LE edema (on Lasix), Afib   WEIGHT BEARING RESTRICTIONS: No  FALLS:  Has patient fallen in last 6 months? Yes. Number of falls 1  LIVING ENVIRONMENT: Lives with: lives with their family and lives with their spouse Lives in: House/apartment Stairs: Yes; Internal: 14 steps; on left going up and External: 3 steps; on right going up Has following equipment at home: Single point cane and Walker - 4 wheeled  OCCUPATION: attorney/ mostly retired?  LEISURE: golf (can't do now.), use to play racquetball  HAND DOMINANCE: right   PRIOR LEVEL OF FUNCTION: Independent  POSTURE:significant slump sitting posture with forward head  PATIENT GOALS: decrease pain,get stronger,improve  balance   OBJECTIVE:  COGNITION: Overall cognitive status: Within functional limits for tasks assessed   PALPATION: Very tender bilateral UT/levator, scapular regions  OBSERVATIONS / OTHER ASSESSMENTS: very flexed head and neck with chin nearly at chest  SENSATION: Light touch: Appears intact    POSTURE: significant forward head, round shoulders, increased kyphosis   Gait holding single point cane in left hand but not using. Right shoulder low, flexed neck;difficulty raising head to see where he is going  UPPER EXTREMITY AROM/PROM:  A/PROM RIGHT   eval   Shoulder extension   Shoulder flexion 91 with compensation  Shoulder abduction 67 with compensation  Shoulder internal rotation   Shoulder external rotation C6    (Blank rows = not tested)  A/PROM LEFT   eval  Shoulder extension   Shoulder flexion 123  Shoulder abduction 112  Shoulder internal rotation T7  Shoulder external rotation t3    (Blank rows = not tested)  CERVICAL AROM: All within normal limits:    Percent limited  Flexion Fully flexed  Extension From flexed position decreased 80%  Right lateral flexion   Left lateral flexion   Right rotation Dec 50% sitting in FH posture  Left rotation        Dec 50%    UPPER EXTREMITY STRENGTH:    LOWER EXTREMITY AROM/PROM:  A/PROM Right eval  Hip flexion   Hip extension   Hip abduction   Hip adduction   Hip internal rotation   Hip external rotation   Knee flexion   Knee extension   Ankle dorsiflexion   Ankle plantarflexion   Ankle inversion   Ankle eversion    (Blank rows = not tested)  A/PROM LEFT eval  Hip flexion   Hip extension   Hip abduction   Hip adduction   Hip internal rotation   Hip external rotation   Knee flexion   Knee extension  Ankle dorsiflexion   Ankle plantarflexion   Ankle inversion   Ankle eversion    (Blank rows = not tested)  LOWER EXTREMITY MMT:   LYMPHEDEMA ASSESSMENTS:   SURGERY TYPE/DATE:  8/17/23Left prox humerus fixation for pathologic fx  2/2/2024Fusion T1-T4 for pathologic fx with cord compression 06/04/22 posterior cervical fusion/foraminotomy;T1-4 fusion NUMBER OF LYMPH NODES REMOVED: NA  CHEMOTHERAPY: Velcade/promalyst/Dex;  RADIATION:to prox humerus and thoracic spine pathologic fx  HORMONE TREATMENT: NA  INFECTIONS: NA  FUNCTIONAL TESTS:    GAIT: Distance walked: 20 Assistive device utilized: carried SPC in left Level of assistance: Complete Independence Comments: feels unsteady, very flexed neck posture   QUICK DASH SURVEY:    TODAY'S TREATMENT:                                                                                                                                         DATE:   10/04/2022 Pulse 53, O2 98% at rest before exercising 4 position balance test; able to hold DLS feet together , and half tandem stance 10 seconds. Unable to maintain full tandem stance for 10 sec, unable to hold SLS greater than a second or 2 on left, and 4 seconds on right. Green theraband for standing scapular retraction and shoulder extension 2 x 10, sitting bilateral ER 2 x10(reviewed prior HEP) Sitting with towel roll and cervical retractions x 10 Supine with 2 pillows; and 1 towel under head;gently retracting head into therapist hands 2 x 10 reps qith guidance for proper form Supine serratus punch, Right with PT guidance 2 x 10, chest press right 0# with scapular depress to prevent substitution x 10 Supine STM with cocoa butter to neck and bilateral UT/upper scapular region Check O2 sats several times during exs especially with standing; 95% one occasion and returned quickly to 97, several times 97% but staying in a good place.   09/23/2022  PATIENT EDUCATION:  Education details: sitting posture with lumbar roll Person educated: Patient Education method: Medical illustrator Education comprehension: verbalized understanding and returned  demonstration  HOME EXERCISE PROGRAM: Doing HEP from HHPT; cervical ROM, shoulder rolls, shrugs, scap retraction, UT stretches  ASSESSMENT:  CLINICAL IMPRESSION: Pt just got out of the hospital yesterday for pneumonia, but was able to maintain very good O2 sats during therapy when checked multiple times. Pt did well with green scapular exercises and he is doing these at home from prior PT. He improved in supine working on gentle cervical retractions to help strengthen his neck, with guidance from PT. Balance assessed today and pt with significant difficulty with tandem stance and SLS bilaterally.   OBJECTIVE IMPAIRMENTS: decreased activity tolerance, decreased balance, difficulty walking, decreased ROM, decreased strength, increased edema, impaired UE functional use, postural dysfunction, and pain.   ACTIVITY LIMITATIONS: carrying, lifting, dressing, reach over head, and locomotion level  PARTICIPATION LIMITATIONS: cleaning, laundry, driving, and occupation  PERSONAL FACTORS:  Multiple  Myeloma  are also affecting patient's functional outcome.   REHAB POTENTIAL: Good  CLINICAL DECISION MAKING: Evolving/moderate complexity  EVALUATION COMPLEXITY: Moderate  GOALS: Goals reviewed with patient? Yes  SHORT TERM GOALS: Target date: 10/21/22  Pt will be educated in and independent in proper sitting posture using lumbar roll prn Baseline: Goal status: INITIAL  2.  Pt will report decreased muscle soreness in bilateral upper back x 25% Baseline:  Goal status: INITIAL  3.  Pt will be compliant with a HEP for postural strengthening/ROM Baseline:  Goal status: INITIAL   LONG TERM GOALS: Target date: 11/04/22  Pt will be able to bring his head to neutral position for safety with ambulation/driving Baseline:  Goal status: INITIAL  2.  Pt will have decreased upper back pain by 50% or more Baseline:  Goal status: INITIAL  3.  Pt will be able to flex right shoulder to 115 degrees with  minimal compensation for improved reaching ability Baseline:  Goal status: INITIAL 4; Pt will be able to maintain tandem stance on both sides x 10 sec to demonstrated improved balance. Goal status:NEW   PT FREQUENCY: 2x/week  PT DURATION: 6 weeks  PLANNED INTERVENTIONS: Therapeutic exercises, Therapeutic activity, Neuromuscular re-education, Balance training, Gait training, Patient/Family education, Self Care, Joint mobilization, Aquatic Therapy, Manual therapy, and Re-evaluation  PLAN FOR NEXT SESSION: pt to be in pool 1x/week and clinic 1x/week, but may start 2x in clinic if pool not yet available. 4 position balance test or berg next and make goal prn, postural exercises ( and TB), reinforcement of head and neck position and proper sitting posture,UE strength/ avoiding compensation with right, balance activities, STM upper back/scapular area.   Waynette Buttery, PT 10/04/2022, 11:59 AM 12/17/21

## 2022-10-04 NOTE — Telephone Encounter (Signed)
Received call from pt. He states he is home from the hospital and is feeling better the last 2 days. He was diagnosed with pneumonia. He states he needs he Dexamethasone refilled as well as his pomalyst. He also wanted to make sure he saw [facility] Dr. Leonides Schanz on Wednesday before his next treatment. Advised that I have refilled his Dex and his pomalyst. Advised he call [employer] about the shipping date for his pomalyst. Also advised that Dr. Leonides Schanz will see him in infusion prior to his Velcade injection. Pt voiced understanding to all of the above.

## 2022-10-06 ENCOUNTER — Other Ambulatory Visit: Payer: Self-pay

## 2022-10-06 ENCOUNTER — Inpatient Hospital Stay (HOSPITAL_BASED_OUTPATIENT_CLINIC_OR_DEPARTMENT_OTHER): Payer: Medicare Other | Admitting: Hematology and Oncology

## 2022-10-06 ENCOUNTER — Inpatient Hospital Stay: Payer: Medicare Other | Attending: Hematology and Oncology

## 2022-10-06 ENCOUNTER — Inpatient Hospital Stay: Payer: Medicare Other

## 2022-10-06 VITALS — BP 140/57 | HR 57 | Temp 98.2°F | Resp 18 | Wt 237.0 lb

## 2022-10-06 DIAGNOSIS — J90 Pleural effusion, not elsewhere classified: Secondary | ICD-10-CM | POA: Insufficient documentation

## 2022-10-06 DIAGNOSIS — I700 Atherosclerosis of aorta: Secondary | ICD-10-CM | POA: Diagnosis not present

## 2022-10-06 DIAGNOSIS — Z7961 Long term (current) use of immunomodulator: Secondary | ICD-10-CM | POA: Diagnosis not present

## 2022-10-06 DIAGNOSIS — R21 Rash and other nonspecific skin eruption: Secondary | ICD-10-CM | POA: Diagnosis not present

## 2022-10-06 DIAGNOSIS — M8580 Other specified disorders of bone density and structure, unspecified site: Secondary | ICD-10-CM | POA: Diagnosis not present

## 2022-10-06 DIAGNOSIS — Z86718 Personal history of other venous thrombosis and embolism: Secondary | ICD-10-CM | POA: Insufficient documentation

## 2022-10-06 DIAGNOSIS — C9000 Multiple myeloma not having achieved remission: Secondary | ICD-10-CM

## 2022-10-06 DIAGNOSIS — Z923 Personal history of irradiation: Secondary | ICD-10-CM | POA: Insufficient documentation

## 2022-10-06 DIAGNOSIS — D61810 Antineoplastic chemotherapy induced pancytopenia: Secondary | ICD-10-CM | POA: Diagnosis not present

## 2022-10-06 DIAGNOSIS — M533 Sacrococcygeal disorders, not elsewhere classified: Secondary | ICD-10-CM | POA: Diagnosis not present

## 2022-10-06 DIAGNOSIS — G4733 Obstructive sleep apnea (adult) (pediatric): Secondary | ICD-10-CM | POA: Diagnosis not present

## 2022-10-06 DIAGNOSIS — R0600 Dyspnea, unspecified: Secondary | ICD-10-CM | POA: Insufficient documentation

## 2022-10-06 DIAGNOSIS — Z7952 Long term (current) use of systemic steroids: Secondary | ICD-10-CM | POA: Insufficient documentation

## 2022-10-06 DIAGNOSIS — E876 Hypokalemia: Secondary | ICD-10-CM | POA: Diagnosis not present

## 2022-10-06 DIAGNOSIS — M5126 Other intervertebral disc displacement, lumbar region: Secondary | ICD-10-CM | POA: Insufficient documentation

## 2022-10-06 DIAGNOSIS — Z515 Encounter for palliative care: Secondary | ICD-10-CM | POA: Insufficient documentation

## 2022-10-06 DIAGNOSIS — I480 Paroxysmal atrial fibrillation: Secondary | ICD-10-CM | POA: Diagnosis not present

## 2022-10-06 DIAGNOSIS — I119 Hypertensive heart disease without heart failure: Secondary | ICD-10-CM | POA: Insufficient documentation

## 2022-10-06 DIAGNOSIS — Z7901 Long term (current) use of anticoagulants: Secondary | ICD-10-CM | POA: Insufficient documentation

## 2022-10-06 DIAGNOSIS — Z8249 Family history of ischemic heart disease and other diseases of the circulatory system: Secondary | ICD-10-CM | POA: Insufficient documentation

## 2022-10-06 DIAGNOSIS — Z818 Family history of other mental and behavioral disorders: Secondary | ICD-10-CM | POA: Insufficient documentation

## 2022-10-06 DIAGNOSIS — T451X5A Adverse effect of antineoplastic and immunosuppressive drugs, initial encounter: Secondary | ICD-10-CM

## 2022-10-06 DIAGNOSIS — Z79899 Other long term (current) drug therapy: Secondary | ICD-10-CM | POA: Insufficient documentation

## 2022-10-06 DIAGNOSIS — R600 Localized edema: Secondary | ICD-10-CM | POA: Diagnosis not present

## 2022-10-06 DIAGNOSIS — Z881 Allergy status to other antibiotic agents status: Secondary | ICD-10-CM | POA: Diagnosis not present

## 2022-10-06 DIAGNOSIS — Z9089 Acquired absence of other organs: Secondary | ICD-10-CM | POA: Insufficient documentation

## 2022-10-06 DIAGNOSIS — Z5112 Encounter for antineoplastic immunotherapy: Secondary | ICD-10-CM | POA: Insufficient documentation

## 2022-10-06 DIAGNOSIS — K760 Fatty (change of) liver, not elsewhere classified: Secondary | ICD-10-CM | POA: Insufficient documentation

## 2022-10-06 DIAGNOSIS — Z823 Family history of stroke: Secondary | ICD-10-CM | POA: Insufficient documentation

## 2022-10-06 DIAGNOSIS — R1310 Dysphagia, unspecified: Secondary | ICD-10-CM | POA: Insufficient documentation

## 2022-10-06 LAB — CBC WITH DIFFERENTIAL (CANCER CENTER ONLY)
Abs Immature Granulocytes: 0.11 10*3/uL — ABNORMAL HIGH (ref 0.00–0.07)
Basophils Absolute: 0.1 10*3/uL (ref 0.0–0.1)
Basophils Relative: 2 %
Eosinophils Absolute: 0.1 10*3/uL (ref 0.0–0.5)
Eosinophils Relative: 2 %
HCT: 33.1 % — ABNORMAL LOW (ref 39.0–52.0)
Hemoglobin: 10.5 g/dL — ABNORMAL LOW (ref 13.0–17.0)
Immature Granulocytes: 2 %
Lymphocytes Relative: 12 %
Lymphs Abs: 0.7 10*3/uL (ref 0.7–4.0)
MCH: 31.1 pg (ref 26.0–34.0)
MCHC: 31.7 g/dL (ref 30.0–36.0)
MCV: 97.9 fL (ref 80.0–100.0)
Monocytes Absolute: 0.4 10*3/uL (ref 0.1–1.0)
Monocytes Relative: 7 %
Neutro Abs: 4.4 10*3/uL (ref 1.7–7.7)
Neutrophils Relative %: 75 %
Platelet Count: 395 10*3/uL (ref 150–400)
RBC: 3.38 MIL/uL — ABNORMAL LOW (ref 4.22–5.81)
RDW: 15.7 % — ABNORMAL HIGH (ref 11.5–15.5)
WBC Count: 5.8 10*3/uL (ref 4.0–10.5)
nRBC: 0.3 % — ABNORMAL HIGH (ref 0.0–0.2)

## 2022-10-06 LAB — CMP (CANCER CENTER ONLY)
ALT: 24 U/L (ref 0–44)
AST: 17 U/L (ref 15–41)
Albumin: 3.6 g/dL (ref 3.5–5.0)
Alkaline Phosphatase: 112 U/L (ref 38–126)
Anion gap: 6 (ref 5–15)
BUN: 12 mg/dL (ref 8–23)
CO2: 26 mmol/L (ref 22–32)
Calcium: 8.2 mg/dL — ABNORMAL LOW (ref 8.9–10.3)
Chloride: 109 mmol/L (ref 98–111)
Creatinine: 0.76 mg/dL (ref 0.61–1.24)
GFR, Estimated: >60 mL/min (ref >60)
Glucose, Bld: 105 mg/dL — ABNORMAL HIGH (ref 70–99)
Potassium: 4.2 mmol/L (ref 3.5–5.1)
Sodium: 141 mmol/L (ref 135–145)
Total Bilirubin: 0.3 mg/dL (ref 0.3–1.2)
Total Protein: 6.1 g/dL — ABNORMAL LOW (ref 6.5–8.1)

## 2022-10-06 MED ORDER — BORTEZOMIB CHEMO SQ INJECTION 3.5 MG (2.5MG/ML)
1.3000 mg/m2 | Freq: Once | INTRAMUSCULAR | Status: AC
  Administered 2022-10-06: 3 mg via SUBCUTANEOUS
  Filled 2022-10-06: qty 1.2

## 2022-10-06 NOTE — Progress Notes (Signed)
[facility] [phone]) [phone]   [phone]) [phone]  PROGRESS NOTE  Patient Care Team: [name], MD as PCP - General (Internal Medicine) Pricilla Riffle, MD as PCP - Cardiology (Cardiology) Quintella Reichert, MD as PCP - Sleep Medicine (Cardiology) Lanier Prude, MD as PCP - Electrophysiology (Cardiology) [name], MD as Consulting Physician (General Surgery) Vida Rigger, MD as Consulting Physician (Gastroenterology) Quintella Reichert, MD as Consulting Physician (Sleep Medicine) Glendale Chard, DO as Consulting Physician (Neurology)  Hematological/Oncological History # IgA Kappa Multiple Myeloma 05/05/2020: Bone marrow biopsy showed increased number of plasma cells representing 14% of all cells in the aspirate associated with numerous variably sized clusters in the clot and biopsy sections 04/10/2021: M protein 0.3, IgA kappa specificity. Kappa 580.5, Labda 7.0, ratio 82.93.   12/07/2021: Labs show of 1971.3, lambda 5.5, ratio 358.42 12/10/2021: Transition care to Dr. Leonides Schanz. 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  01/26/2022: Cycle 2 Day 1 of Dara/Rev/Dex 02/22/2022: Cycle 3 Day 1 of Dara/Rev/Dex 03/23/2022: Cycle 4 Day 1 of Dara/Rev/Dex 04/20/2022: Cycle 5 Day 1 of Dara/Rev/Dex 05/18/2022: Cycle 6 Day 1 of Dara/Rev/Dex 05/27/2022: MRI cervical/thoracic/lumbar: pathologic fracture at T2 with significant osseous retropulsion and resulting cord compression. Additional smaller lesions within T1 vertebral body, right C7 and T3 articulating facets.  Small Multiple Myeloma lesions scattered at all lumbar levels and in the visible left sacral ala. Largest lumbar tumor is 2.3 cm in the left L1 posterior elements and pedicle. No lumbar extraosseous or epidural tumor extension, or pathologic fracture.Underlying chronic lumbar spine  degeneration, with a new central disc herniation at L3-L4 since 2021 now resulting in mild to moderate degenerative spinal stenosis there. 05/28/2022-06/09/2022: Admitted for T2 pathologic fracture and cord compression. He received urgent radiation therapy for a total of 3.0 Gy. He underwent  thoracic 1 through 4 posterior lateral arthrodesis/fusion and 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  06/23/2022: Cycle 1 Day 1 of Velcade/Pomalyst/Dex 08/04/2022: Cycle 2 Day 1 of Velcade/Pomalyst/Dex. Pomalyst to be decreased to 2mg  PO daily due to cytopenias.  09/01/2022: Cycle 3 Day 1 of Velcade/Pomalyst/Dex. (Missed 2nd dose of cycle due to hospitalization)  10/06/2022: Cycle 4 Day 1 of Velcade/Pomalyst/Dex.   Interval History:  Travis [name] 72 y.o. male with medical history significant for IgA Kappa Multiple Myeloma who presents for a follow up visit. He was last seen on 09/01/2022. In the interim, he missed his treatment of Velcade/Pomalyst/Dex therapy while admitted in the hospital.   On exam today Travis [name] reports he has been well since his discharge from the hospital.  He reports he is "not back to 100%".  He thinks he is about 50%.  He reports that the pneumonia did cause issues with his atrial fibrillation and lungs.  He notes that he definitely "feels up to it" in terms of restarting his chemotherapy.  He notes that his appetite has been decreased but his breathing is back to baseline.  Overall he is willing and able to proceed with treatment today.  He is not having any signs or symptoms concerning for residual or recurrent infection.  He denies fevers, chills, sweats, shortness of breath, chest pain or cough. He has no other complaints. Rest of the 10 point ROS is below.     MEDICAL HISTORY:  Past Medical History:  Diagnosis Date   A-fib (HCC)  Arthritis    "comes and goes; mostly in hands, occasionally elbow" (04/05/2017)   Complication of anesthesia    "just wanted to  sleep alot  for couple days S/P VARICOCELE OR" (04/05/2017)   DVT (deep venous thrombosis) (HCC)    LLE   Dysrhythmia    PVCs    GERD (gastroesophageal reflux disease)    History of hiatal hernia    History of radiation therapy    Thoracic Spine- 07/13/22-07/23/22-Dr. Antony Blackbird   Hypertension    Impaired glucose tolerance    Multiple myeloma (HCC)    Obesity (BMI 30-39.9) 07/26/2016   OSA on CPAP 07/26/2016   Mild with AHI 12.5/hr now on CPAP at 14cm H2O   PAF (paroxysmal atrial fibrillation) (HCC)    s/p PVI Ablation in 2018 // Flecainide Rx // Echo 8/21: EF 60-65, no RWMA, mild LVH, normal RV SF, moderate RVE, mild BAE, trivial MR, mild dilation of aortic root (40 mm)   Pneumonia    "may have had walking pneumonia a few years ago" (04/05/2017)   Pre-diabetes    Thrombophlebitis     SURGICAL HISTORY: Past Surgical History:  Procedure Laterality Date   ATRIAL FIBRILLATION ABLATION  04/05/2017   ATRIAL FIBRILLATION ABLATION N/A 04/05/2017   Procedure: ATRIAL FIBRILLATION ABLATION;  Surgeon: [name], MD;  Location: [facility];  Service: Cardiovascular;  Laterality: N/A;   BONE BIOPSY Right 12/17/2021   Procedure: BONE BIOPSY;  Surgeon: [name], MD;  Location: [facility];  Service: Orthopedics;  Laterality: Right;   COMPLETE RIGHT HIP REPLACMENT  01/19/2019   EVALUATION UNDER ANESTHESIA WITH HEMORRHOIDECTOMY N/A 02/24/2017   Procedure: EXAM UNDER ANESTHESIA WITH  HEMORRHOIDECTOMY;  Surgeon: [name], MD;  Location: [facility];  Service: General;  Laterality: N/A;   EXCISIONAL TOTAL HIP ARTHROPLASTY WITH ANTIBIOTIC SPACERS Right 09/25/2020   Procedure: EXCISIONAL TOTAL HIP ARTHROPLASTY WITH ANTIBIOTIC SPACERS;  Surgeon: [name], MD;  Location: [facility];  Service: Orthopedics;  Laterality: Right;   FLEXIBLE SIGMOIDOSCOPY N/A 04/15/2015   Procedure:  UNSEDATED FLEXIBLE SIGMOIDOSCOPY;  Surgeon: [name], MD;  Location: [facility];   Service: Endoscopy;  Laterality: N/A;   HUMERUS IM NAIL Right 12/17/2021   Procedure: INTRAMEDULLARY (IM) NAIL HUMERAL;  Surgeon: [name], MD;  Location: [facility];  Service: Orthopedics;  Laterality: Right;   KNEE ARTHROSCOPY Left ~ 2016   POSTERIOR CERVICAL FUSION/FORAMINOTOMY N/A 06/04/2022   Procedure: Thoracic one - Thoracic Four Posterior lateral arthrodesis/ fusion and Thoracic two Corpectomy;  Surgeon: [name], MD;  Location: [facility];  Service: Neurosurgery;  Laterality: N/A;   REIMPLANTATION OF TOTAL HIP Right 12/25/2020   Procedure: REIMPLANTATION/REVISION OF RIGHT TOTAL HIP VERSUS REPEAT IRRIGATION AND DEBRIDEMENT;  Surgeon: [name], MD;  Location: [facility];  Service: Orthopedics;  Laterality: Right;   TESTICLE SURGERY  1988   VARICOCELE   TONSILLECTOMY     VARICOSE VEIN SURGERY Bilateral     SOCIAL HISTORY: Social History   Socioeconomic History   Marital status: Married    Spouse name: Not on file   Number of children: 1   Years of education: law school   Highest education level: Not on file  Occupational History   Occupation: lawyer  Tobacco Use   Smoking status: Never   Smokeless tobacco: Never  Vaping Use   Vaping Use: Never used  Substance and Sexual Activity   Alcohol use: Yes    Alcohol/week: 2.0 standard drinks of  alcohol    Types: 2 Glasses of wine per week    Comment: 2 glasses of wine a week, occ bourbon, beer   Drug use: No   Sexual activity: Not Currently  Other Topics Concern   Not on file  Social History Narrative   Lives in [place] with spouse in a 2 story home.  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 1 daughter.     Education: Social worker school.    Social Determinants of Health   Financial Resource Strain: Not on file  Food Insecurity: No Food Insecurity (09/27/2022)   Hunger Vital Sign    Worried About Running Out of Food in the Last Year: Never true    Ran Out of Food in the Last Year: Never  true  Transportation Needs: No Transportation Needs (09/27/2022)   PRAPARE - Administrator, Civil Service (Medical): No    Lack of Transportation (Non-Medical): No  Physical Activity: Not on file  Stress: Not on file  Social Connections: Not on file  Intimate Partner Violence: Not At Risk (09/27/2022)   Humiliation, Afraid, Rape, and Kick questionnaire    Fear of Current or Ex-Partner: No    Emotionally Abused: No    Physically Abused: No    Sexually Abused: No    FAMILY HISTORY: Family History  Problem Relation Age of Onset   Dementia Mother    Stroke Father    Atrial fibrillation Father    Hypertension Father    Hypertension Paternal Grandfather    Dementia Maternal Grandmother     ALLERGIES:  is allergic to linezolid and vancomycin.  MEDICATIONS:  Current Outpatient Medications  Medication Sig Dispense Refill   acyclovir (ZOVIRAX) 400 MG tablet Take 1 tablet (400 mg total) by mouth 2 (two) times daily. 180 tablet 3   albuterol (VENTOLIN HFA) 108 (90 Base) MCG/ACT inhaler Inhale 2 puffs into the lungs every 6 (six) hours as needed for wheezing or shortness of breath. 8 g 0   amiodarone (PACERONE) 200 MG tablet Take 1 tablet (200 mg total) by mouth daily. Start after finishing 40 mg twice daily dose of  days (Patient taking differently: Take 200 mg by mouth daily.) 180 tablet 1   apixaban (ELIQUIS) 5 MG TABS tablet Take 1 tablet (5 mg total) by mouth 2 (two) times daily. 60 tablet 11   cholecalciferol (VITAMIN D3) 25 MCG (1000 UNIT) tablet Take 1,000 Units by mouth daily.     Cyanocobalamin (VITAMIN B-12 PO) Take 1,000 mcg by mouth daily.     dexamethasone (DECADRON) 4 MG tablet Take 10 tablets (40 mg total) by mouth every 7 (seven) days. Take 40 mg by mouth once a day in the morning of chemo on Wednesdays- or unless otherwise instructed 40 tablet 2   fluticasone (FLONASE) 50 MCG/ACT nasal spray Place 1 spray into both nostrils daily as needed for allergies or  rhinitis.     furosemide (LASIX) 20 MG tablet Take 3 tablets (60 mg total) by mouth daily. If your weight increases by more than 2 pounds compared to the day prior or 5 pounds over the past week take an additional dose that day. 90 tablet 2   gabapentin (NEURONTIN) 300 MG capsule TAKE TWO CAPSULES BY MOUTH TWICE A DAY (Patient taking differently: Take 600 mg by mouth 2 (two) times daily.) 360 capsule 1   hydrALAZINE (APRESOLINE) 50 MG tablet Take 50 mg by mouth 3 (three) times daily.  lisinopril (ZESTRIL) 40 MG tablet TAKE ONE TABLET BY MOUTH ONE TIME DAILY (Patient taking differently: Take 40 mg by mouth daily.) 90 tablet 3   metolazone (ZAROXOLYN) 2.5 MG tablet Take 2.5 mg by mouth every other day.     nystatin (MYCOSTATIN/NYSTOP) powder Apply topically 2 (two) times daily for 13 days. Apply under both arms. 30 g 0   Omega-3 Fatty Acids (FISH OIL PO) Take 1 Capful by mouth daily. (Patient not taking: Reported on 10/13/2022)     pomalidomide (POMALYST) 2 MG capsule Take 1 capsule (2 mg total) by mouth daily. Take one tablet (2 mg by mouth) daily for 21 days, then take 7 days off [employer] auth #[id number]  Date obtained 09/17/22 21 capsule 0   potassium chloride SA (KLOR-CON M) 20 MEQ tablet Take 2 tablets (40 mEq total) by mouth daily. Take an extra tablet on the day you take the Metolazone (60 meq total) (Patient taking differently: Take 40 mEq by mouth 2 (two) times daily. 2 tabs AM and 1 PM) 180 tablet 3   sodium chloride (OCEAN) 0.65 % SOLN nasal spray Place 1 spray into both nostrils as needed for congestion.     No current facility-administered medications for this visit.    REVIEW OF SYSTEMS:   Constitutional: ( - ) fevers, ( - )  chills , ( - ) night sweats Eyes: ( - ) blurriness of vision, ( - ) double vision, ( - ) watery eyes Ears, nose, mouth, throat, and face: ( - ) mucositis, ( - ) sore throat Respiratory: ( - ) cough, ( - ) dyspnea, ( - ) wheezes Cardiovascular: ( - ) palpitation, (  - ) chest discomfort, ( - ) lower extremity swelling Gastrointestinal:  ( - ) nausea, ( - ) heartburn, ( - ) change in bowel habits Skin: ( - ) abnormal skin rashes Lymphatics: ( - ) new lymphadenopathy, ( - ) easy bruising Neurological: ( - ) numbness, ( - ) tingling, ( - ) new weaknesses Behavioral/Psych: ( - ) mood change, ( - ) new changes  All other systems were reviewed with the patient and are negative.  PHYSICAL EXAMINATION: ECOG PERFORMANCE STATUS: 2 - Symptomatic, <50% confined to bed  There were no vitals filed for this visit.    There were no vitals filed for this visit.     GENERAL: Well-appearing elderly Caucasian male, alert, no distress and comfortable SKIN: no overt abnormalities of the skin.  EYES: conjunctiva are pink and non-injected, sclera clear LUNGS: clear to auscultation and percussion with normal breathing effort HEART: tachycardic &  no murmurs. +bilateral lower extremity edema, left greater than right Musculoskeletal: no cyanosis of digits and no clubbing  PSYCH: alert & oriented x 3, fluent speech NEURO: no focal motor/sensory deficits  LABORATORY DATA:  I have reviewed the data as listed    Latest Ref Rng & Units 10/13/2022   12:19 PM 10/06/2022   10:24 AM 10/03/2022    4:04 AM  CBC  WBC 4.0 - 10.5 K/uL 10.8  5.8  4.1   Hemoglobin 13.0 - 17.0 g/dL 54.0  98.1  9.4   Hematocrit 39.0 - 52.0 % 34.6  33.1  29.6   Platelets 150 - 400 K/uL 255  395  230        Latest Ref Rng & Units 10/13/2022   12:19 PM 10/06/2022   10:24 AM 10/03/2022    4:04 AM  CMP  Glucose 70 - 99 mg/dL 191  105  105   BUN 8 - 23 mg/dL 14  12  15    Creatinine 0.61 - 1.24 mg/dL 1.61  0.96  0.45   Sodium 135 - 145 mmol/L 141  141  140   Potassium 3.5 - 5.1 mmol/L 3.8  4.2  3.7   Chloride 98 - 111 mmol/L 107  109  109   CO2 22 - 32 mmol/L 29  26  25    Calcium 8.9 - 10.3 mg/dL 8.7  8.2  7.6   Total Protein 6.5 - 8.1 g/dL 6.2  6.1  5.2   Total Bilirubin 0.3 - 1.2 mg/dL 0.3  0.3   0.6   Alkaline Phos 38 - 126 U/L 98  112  57   AST 15 - 41 U/L 14  17  23    ALT 0 - 44 U/L 18  24  32     Lab Results  Component Value Date   MPROTEIN Not Observed 10/06/2022   MPROTEIN Not Observed 09/01/2022   MPROTEIN Not Observed 08/04/2022   Lab Results  Component Value Date   KPAFRELGTCHN 49.0 (H) 10/06/2022   KPAFRELGTCHN 24.2 (H) 09/01/2022   KPAFRELGTCHN 16.1 08/04/2022   LAMBDASER 12.0 10/06/2022   LAMBDASER 5.8 09/01/2022   LAMBDASER 3.8 (L) 08/04/2022   KAPLAMBRATIO 4.08 (H) 10/06/2022   KAPLAMBRATIO 4.17 (H) 09/01/2022   KAPLAMBRATIO 4.24 (H) 08/04/2022     RADIOGRAPHIC STUDIES: DG Chest 1 View  Result Date: 10/03/2022 CLINICAL DATA:  72 year old male with history of pneumonia. EXAM: CHEST  1 VIEW COMPARISON:  Chest x-ray 09/28/2022. FINDINGS: Diffuse interstitial prominence, widespread peribronchial cuffing and patchy ill-defined opacities are noted throughout the lungs bilaterally, most confluent in the right upper lobe where the extent of airspace consolidation appears slightly increased compared to the recent prior study. No definite pleural effusions. No pneumothorax. No cephalization of the pulmonary vasculature. Heart size is normal. The patient is rotated to the left on today's exam, resulting in distortion of the mediastinal contours and reduced diagnostic sensitivity and specificity for mediastinal pathology. Orthopedic fixation hardware throughout the cervical spine. IMPRESSION: 1. Severe multilobar bilateral bronchopneumonia, most confluent in the right upper lobe, which appears progressive compared to the prior study, as above. Electronically Signed   By: Trudie Reed M.D.   On: 10/03/2022 08:38   CT CHEST W CONTRAST  Result Date: 09/30/2022 CLINICAL DATA:  Pneumonia with complications suspected and shortness of breath. Flu positive. EXAM: CT CHEST WITH CONTRAST TECHNIQUE: Multidetector CT imaging of the chest was performed during intravenous contrast  administration. RADIATION DOSE REDUCTION: This exam was performed according to the departmental dose-optimization program which includes automated exposure control, adjustment of the mA and/or kV according to patient size and/or use of iterative reconstruction technique. CONTRAST:  75mL OMNIPAQUE IOHEXOL 300 MG/ML  SOLN COMPARISON:  Portable chest 09/28/2022, AP Lat chest 09/26/2022, CTA chest 06/29/2022, CTA chest 03/15/2022. FINDINGS: Cardiovascular: The heart is upper limit of normal in size, smaller than on the last CTA. No pericardial effusion is seen. Chronic prominence of the pulmonary trunk indicating arterial hypertension is again noted, measuring 5.6 cm. Superior pulmonary veins are slightly prominent but no more prominent than previously. There are no visible significant coronary calcifications. There is minimal scattered aortic calcific plaque of the descending segment. The aorta and great vessels are otherwise clear. There is no aortic aneurysm, stenosis or dissection. Mediastinum/Nodes: There are mildly prominent subcarinal and bilateral hilar lymph nodes both up to 1.2 cm short axis, but the adenopathy  has improved since 06/29/2022 and is less prominent. A few right paratracheal nodes of similar size are also smaller than previously as well as borderline prominent left-sided prevascular nodes. There is no new or worsening adenopathy, no bulky or encasing nodes. Axillary spaces are clear. The thyroid gland, the thoracic trachea, main bronchi and thoracic esophagus are unremarkable. Lungs/Pleura: There are small layering pleural effusions on the right greater than left, similar to the last CT. There are mild features of subpleural interstitial edema in the lung bases but this is not seen elsewhere. There is interval increase patchy consolidation in the left lower lobe, most dense alongside the effusion. Additional patchy airspace disease noted in the right greater than left upper lobes but less dense  than previously, with patchy ground-glass and tree-in-bud interstitial changes in the base of the right upper lobe and in the right lower lobe, with minimal consolidation in the posterior right middle lobe. Findings favor multifocal pneumonia. An aspiration component is not excluded. Upper Abdomen: No acute findings. The liver is mildly steatotic. A thin walled homogeneous cyst, Hounsfield density of -1, is again noted alongside the pancreatic tail and may represent a pseudocyst, pancreatic cyst or mucinous neoplasm. This was first seen on the CTA chest from 03/15/2022. The last abdomen and pelvis dedicated CT was 05/14/2015 and this was not present at that time. MRI without and with contrast is recommended on a nonemergent basis to further characterize this entity. Musculoskeletal: History of myeloma. Expansile lesion again noted in the posterior right sixth rib and T11 spinous process. There is additional lytic lesion again in the left L1 and left L2 pedicles. A dorsal fusion construct with T2 corpectomy and interbody hardware is again also noted spanning T1-4 and seems unchanged. There is bridging enthesopathy multiple other thoracic levels. No new abnormality. IMPRESSION: 1. Multifocal airspace disease in the right greater than left lungs, with interval increase in consolidation in the left lower lobe. Findings favor multifocal pneumonia. An aspiration component is not excluded. 2. Small right greater than left pleural effusions, similar to the last CT. 3. Mild subpleural interstitial edema in the lung bases. Mild distention of the pulmonary veins. 4. Chronic prominence of the pulmonary trunk indicating arterial hypertension. 5. Aortic atherosclerosis.  Borderline cardiomegaly. 6. Mediastinal and hilar adenopathy, but has improved from the last CTA. 7. History of myeloma with expansile lesions in the posterior right sixth rib, T11 spinous process, left L1 and L2 pedicles. No appreciable change from the last CT.  8. Thin walled 2.8 cm homogeneous cystic lesion alongside the pancreatic tail, Hounsfield density -1, Hounsfield density of -1, not present on the last CT abdomen and pelvis in 2017. Nonemergent MRI without and with contrast is recommended to further characterize this. 9. Hepatic steatosis. Aortic Atherosclerosis (ICD10-I70.0). Electronically Signed   By: Almira Bar M.D.   On: 09/30/2022 04:17   DG CHEST PORT 1 VIEW  Result Date: 09/28/2022 CLINICAL DATA:  Dyspnea. EXAM: PORTABLE CHEST 1 VIEW COMPARISON:  09/26/2022. FINDINGS: Unchanged hazy and consolidative opacities right upper and lower lobes, as well as the left lower lung, again suspicious for multifocal pneumonia. Stable cardiac and mediastinal contours. No pleural effusion or pneumothorax. IMPRESSION: Unchanged hazy and consolidative opacities right upper and lower lobes, as well as the left lower lung, again suspicious for multifocal pneumonia. Electronically Signed   By: Orvan Falconer M.D.   On: 09/28/2022 14:53   DG Chest 2 View  Result Date: 09/26/2022 CLINICAL DATA:  Shortness of breath.  Productive  cough and chills. EXAM: CHEST - 2 VIEW COMPARISON:  Chest radiograph dated July 01, 2022 FINDINGS: The heart is enlarged. Bilateral lower lobe hazy opacities concerning for pneumonia. Differential may also include pulmonary edema. Small bilateral pleural effusions. Advanced osteopenia and posterior spinal fusion at cervicothoracic junction. IMPRESSION: Bilateral lower lobe hazy opacities concerning for pneumonia. Differential may also include pulmonary edema. Small bilateral pleural effusions. Electronically Signed   By: Larose Hires D.O.   On: 09/26/2022 16:52    ASSESSMENT & PLAN Travis [name] is a 72 y.o. male with medical history significant for IgA Kappa Multiple Myeloma who presents for a follow up visit.   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  He meets diagnostic criteria by having a serum free light chain ratio greater than 100 and lytic lesions causing pathological fracture of the humerus.  Given this I would recommend we pursued Darzalex, Revlimid, and dexamethasone.  Given his advanced age I would not recommend transplantation, that this is something we can consider when it comes time to transition to maintenance therapy versus transplant.  The patient voices understanding of the plan moving forward.  # IgA Kappa Multiple Myeloma --Metastatic survey completed 12/15/2021 --Patient underwent fixation of his humerus on 12/17/2021, pathology confirmed plasmacytoma/plasma cell neoplasm.  --Received 6 cycles of Darzalex, Revlimid, and dexamethasone started on 12/28/2021-05/18/2022.  --Due to worsening myeloma osseous lesions including pathologic fracture at T2 with cord compression, Dr. Leonides Schanz recommends changing therapy to Velcade/Pomalyst/Dex.  Plan: --Due for Cycle 4, Day 1 of Velcade/Pom/Dex today --labs from today showed white blood cell 5.8, hemoglobin 10.5, MCV 97.9, and platelets of 395 --restaging labs from 08/04/2022 with SPEP and SFLC showed M protein not detectable. Kappa decreased to 16.1 (from 34.9), Lambda 3.8, Ratio 4.24 --Pomalyst was dose reduced to 2 mg p.o. moving due to cytopenias. Recommend to continue --RTC in 2 weeks with interval weekly treatments. Patient has an upcoming vacation so we will cancel treatments for 5/15. He will resume on 5/22 as scheduled.   #Palliative Radiation Therapy: # Dysphagia --Received palliative radiation from 01/06/2022-01/19/2022 to right hymerus, right forearm and right chest/rib, 20 Gy in 10 Fx.  --recieved palliative radiation to surgical bed in the upper thoracic spine area on 07/13/2022.  27 Gy in 9 Fx.   # Rash Concerning for Shingles- improving -- Prescribed valacyclovir 3 times daily 1000 mg x 10 days-completed the course -- We will continue to monitor rash  closely.  #DVT: --Found to have acute DVT involving left peroneal veins. Likely secondary to recent surgery and hospitalization --Currently on eliquis 5 mg BID.  --Due to worsening lower extremity edema, we will repeat doppler US to rule out repeat DVT.   #Hypokalemia: --Currently on potassium chloride 40 mEq twice daily as prescribed but takes three times a day (60 mEq) on the days he takes Metolazine.  --Potassium level is 2.9 today. Gave 40 mEq of oral potassium chloride today.  --Recommend to take 60 mEq of potassium chloride per day. (20 mEq three times a day).   #Rash: --Okay to try topical hydrocortisone cream for spot treatment  #Supportive Care -- chemotherapy education complete -- port placement not required -- zofran 8mg  q8H PRN and compazine 10mg  PO q6H for nausea -- acyclovir 400mg  PO BID for VCZ prophylaxis -- Started Zometa q 12 weeks on 05/18/2022. Next dose April 2024.    No orders of the defined types  were placed in this encounter.   All questions were answered. The patient knows to call the clinic with any problems, questions or concerns.  A total of more than 30 minutes were spent on this encounter with face-to-face time and non-face-to-face time, including preparing to see the patient, ordering tests and/or medications, counseling the patient and coordination of care as outlined above.   Ulysees Barns, MD Department of Hematology/Oncology [facility] Phone: [phone] Pager: [phone] Email: [email or web address]  10/13/2022 4:04 PM

## 2022-10-07 ENCOUNTER — Ambulatory Visit: Payer: Medicare Other

## 2022-10-07 ENCOUNTER — Ambulatory Visit
Admission: RE | Admit: 2022-10-07 | Discharge: 2022-10-07 | Disposition: A | Discharge status: Home / Self Care | Payer: Medicare Other | Source: Ambulatory Visit | Attending: Ophthalmology | Admitting: Ophthalmology

## 2022-10-07 DIAGNOSIS — M542 Cervicalgia: Secondary | ICD-10-CM

## 2022-10-07 DIAGNOSIS — H532 Diplopia: Secondary | ICD-10-CM

## 2022-10-07 DIAGNOSIS — M25511 Pain in right shoulder: Secondary | ICD-10-CM | POA: Diagnosis not present

## 2022-10-07 DIAGNOSIS — C9000 Multiple myeloma not having achieved remission: Secondary | ICD-10-CM | POA: Diagnosis not present

## 2022-10-07 DIAGNOSIS — M6281 Muscle weakness (generalized): Secondary | ICD-10-CM

## 2022-10-07 DIAGNOSIS — R293 Abnormal posture: Secondary | ICD-10-CM

## 2022-10-07 DIAGNOSIS — S22000A Wedge compression fracture of unspecified thoracic vertebra, initial encounter for closed fracture: Secondary | ICD-10-CM

## 2022-10-07 DIAGNOSIS — G8929 Other chronic pain: Secondary | ICD-10-CM | POA: Diagnosis not present

## 2022-10-07 DIAGNOSIS — R278 Other lack of coordination: Secondary | ICD-10-CM | POA: Diagnosis not present

## 2022-10-07 LAB — KAPPA/LAMBDA LIGHT CHAINS
Kappa free light chain: 49 mg/L — ABNORMAL HIGH (ref 3.3–19.4)
Kappa, lambda light chain ratio: 4.08 — ABNORMAL HIGH (ref 0.26–1.65)
Lambda free light chains: 12 mg/L (ref 5.7–26.3)

## 2022-10-07 MED ORDER — GADOPICLENOL 0.5 MMOL/ML IV SOLN
10.0000 mL | Freq: Once | INTRAVENOUS | Status: AC | PRN
  Administered 2022-10-07: 10 mL via INTRAVENOUS

## 2022-10-07 NOTE — Therapy (Signed)
OUTPATIENT PHYSICAL THERAPY ONCOLOGY TREATMENT  Patient Name: Travis [name] MRN: [id number] DOB:14-May-1950, 72 y.o., male Today's Date: 10/07/2022  END OF SESSION:  PT End of Session - 10/07/22 0904     Visit Number 3    Number of Visits 16    Date for PT Re-Evaluation 11/04/22    PT Start Time 0901    PT Stop Time 0949    PT Time Calculation (min) 48 min    Activity Tolerance Patient tolerated treatment well    Behavior During Therapy [facility] for tasks assessed/performed             Past Medical History:  Diagnosis Date   A-fib [facility])    Arthritis    "comes and goes; mostly in hands, occasionally elbow" (04/05/2017)   Complication of anesthesia    "just wanted to sleep alot  for couple days S/P VARICOCELE OR" (04/05/2017)   DVT (deep venous thrombosis) (HCC)    LLE   Dysrhythmia    PVCs    GERD (gastroesophageal reflux disease)    History of hiatal hernia    History of radiation therapy    Thoracic Spine- 07/13/22-07/23/22-Dr. Antony Blackbird   Hypertension    Impaired glucose tolerance    Multiple myeloma (HCC)    Obesity (BMI 30-39.9) 07/26/2016   OSA on CPAP 07/26/2016   Mild with AHI 12.5/hr now on CPAP at 14cm H2O   PAF (paroxysmal atrial fibrillation) (HCC)    s/p PVI Ablation in 2018 // Flecainide Rx // Echo 8/21: EF 60-65, no RWMA, mild LVH, normal RV SF, moderate RVE, mild BAE, trivial MR, mild dilation of aortic root (40 mm)   Pneumonia    "may have had walking pneumonia a few years ago" (04/05/2017)   Pre-diabetes    Thrombophlebitis    Past Surgical History:  Procedure Laterality Date   ATRIAL FIBRILLATION ABLATION  04/05/2017   ATRIAL FIBRILLATION ABLATION N/A 04/05/2017   Procedure: ATRIAL FIBRILLATION ABLATION;  Surgeon: [name], MD;  Location: [facility];  Service: Cardiovascular;  Laterality: N/A;   BONE BIOPSY Right 12/17/2021   Procedure: BONE BIOPSY;  Surgeon: [name], MD;  Location: [facility];  Service:  Orthopedics;  Laterality: Right;   COMPLETE RIGHT HIP REPLACMENT  01/19/2019   EVALUATION UNDER ANESTHESIA WITH HEMORRHOIDECTOMY N/A 02/24/2017   Procedure: EXAM UNDER ANESTHESIA WITH  HEMORRHOIDECTOMY;  Surgeon: [name], MD;  Location: [facility];  Service: General;  Laterality: N/A;   EXCISIONAL TOTAL HIP ARTHROPLASTY WITH ANTIBIOTIC SPACERS Right 09/25/2020   Procedure: EXCISIONAL TOTAL HIP ARTHROPLASTY WITH ANTIBIOTIC SPACERS;  Surgeon: [name], MD;  Location: [facility];  Service: Orthopedics;  Laterality: Right;   FLEXIBLE SIGMOIDOSCOPY N/A 04/15/2015   Procedure:  UNSEDATED FLEXIBLE SIGMOIDOSCOPY;  Surgeon: [name], MD;  Location: [facility];  Service: Endoscopy;  Laterality: N/A;   HUMERUS IM NAIL Right 12/17/2021   Procedure: INTRAMEDULLARY (IM) NAIL HUMERAL;  Surgeon: [name], MD;  Location: [facility];  Service: Orthopedics;  Laterality: Right;   KNEE ARTHROSCOPY Left ~ 2016   POSTERIOR CERVICAL FUSION/FORAMINOTOMY N/A 06/04/2022   Procedure: Thoracic one - Thoracic Four Posterior lateral arthrodesis/ fusion and Thoracic two Corpectomy;  Surgeon: [name], MD;  Location: [facility];  Service: Neurosurgery;  Laterality: N/A;   REIMPLANTATION OF TOTAL HIP Right 12/25/2020   Procedure: REIMPLANTATION/REVISION OF RIGHT TOTAL HIP VERSUS REPEAT IRRIGATION AND DEBRIDEMENT;  Surgeon: [name], MD;  Location: [facility];  Service:  Orthopedics;  Laterality: Right;   TESTICLE SURGERY  1988   VARICOCELE   TONSILLECTOMY     VARICOSE VEIN SURGERY Bilateral    Patient Active Problem List   Diagnosis Date Noted   Pancreatic cyst 09/30/2022   Infection due to novel influenza A virus 09/29/2022   Persistent atrial fibrillation (HCC) 07/15/2022   Acute on chronic diastolic heart failure (HCC) 07/03/2022   Pulmonary infiltrates 07/01/2022   Acute respiratory failure with hypoxia (HCC) 07/01/2022   Hyponatremia 06/29/2022   History of thoracic surgery 06/29/2022    Acute DVT of left tibial vein (HCC) 06/21/2022   Hypokalemia 06/21/2022   Vitamin D deficiency 05/30/2022   Other constipation 05/29/2022   Pathologic compression fracture of spine, initial encounter (HCC) 05/29/2022   Cord compression (HCC) 05/28/2022   Degenerative cervical disc 05/14/2022   Hypercoagulable state due to persistent atrial fibrillation (HCC) 01/11/2022   Multiple myeloma (HCC) 12/10/2021   Medication monitoring encounter 02/27/2021   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   Status post peripherally inserted central catheter (PICC) central line placement 11/06/2020   Drug rash with eosinophilia and systemic symptoms syndrome 11/06/2020   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   PAF (paroxysmal atrial fibrillation) (HCC)    GERD (gastroesophageal reflux disease) 03/29/2018   Hiatal hernia 03/29/2018   History of DVT (deep vein thrombosis) 03/29/2018   CAP (community acquired pneumonia) 03/29/2018   Osteoarthritis 03/29/2018   Neuropathy 08/29/2017   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   Paroxysmal atrial fibrillation with rapid ventricular response (HCC) 04/05/2017   Prolapsed internal hemorrhoids, grade 4, s/p ligation/pexy/hemorrhoidectomy x 2 02/24/2017 02/24/2017   OSA (obstructive sleep apnea) 07/26/2016   Obesity (BMI 30-39.9) 07/26/2016   Atrial fibrillation with RVR (HCC)    Essential hypertension    Chest pain at rest     PCP: Eleanora Neighbor, MD  REFERRING PROVIDER: Antony Blackbird, MD  REFERRING DIAG: Multiple Myeloma  THERAPY DIAG:  Compression fracture of thoracic vertebra, unspecified thoracic vertebral level, initial encounter (HCC)  Multiple myeloma not having achieved remission (HCC)  Muscle weakness (generalized)  Neck pain  Abnormal posture  ONSET DATE: 2023  Rationale for Evaluation and Treatment: Rehabilitation  SUBJECTIVE:                                                                                                                                                                                            SUBJECTIVE STATEMENT:  I didn't sleep well last night, probably from all the steroids they've got me on. But I do feel pretty good today overall. I have a Nustep at home but  it's in my basement and I haven't been able to do the stairs lately due to strength.   PERTINENT HISTORY:  Travis [name] 72 y.o. male with medical history significant for IgA Kappa Multiple Myeloma. He was diagnosed with smouldering myeloma in 2018 . Last July he developed right arm pain due to a pathologic fx and he underwent radiation and fixation. In early Feb 2024 he had radiation for pathologic T2 compression fx followed by a T1-T4 fusion. PAIN:  Are you having pain? No  PRECAUTIONS: Multiple Myeloma with mets to R ribs, R humerus with ORIF for pathological fracture 12/17/21. Lesions have also been found in R rib and also in his skull. Pathologic fx T2 with fusion T1-4 on 06/04/22, Bilateral LE edema (on Lasix), Afib   WEIGHT BEARING RESTRICTIONS: No  FALLS:  Has patient fallen in last 6 months? Yes. Number of falls 1  LIVING ENVIRONMENT: Lives with: lives with their family and lives with their spouse Lives in: House/apartment Stairs: Yes; Internal: 14 steps; on left going up and External: 3 steps; on right going up Has following equipment at home: Single point cane and Walker - 4 wheeled  OCCUPATION: attorney/ mostly retired?  LEISURE: golf (can't do now.), use to play racquetball  HAND DOMINANCE: right   PRIOR LEVEL OF FUNCTION: Independent  POSTURE:significant slump sitting posture with forward head  PATIENT GOALS: decrease pain,get stronger,improve balance   OBJECTIVE:  COGNITION: Overall cognitive status: Within functional limits for tasks assessed   PALPATION: Very tender bilateral UT/levator, scapular regions  OBSERVATIONS / OTHER ASSESSMENTS: very flexed head and neck with chin nearly at  chest  SENSATION: Light touch: Appears intact    POSTURE: significant forward head, round shoulders, increased kyphosis   Gait holding single point cane in left hand but not using. Right shoulder low, flexed neck;difficulty raising head to see where he is going  UPPER EXTREMITY AROM/PROM:  A/PROM RIGHT   eval   Shoulder extension   Shoulder flexion 91 with compensation  Shoulder abduction 67 with compensation  Shoulder internal rotation   Shoulder external rotation C6    (Blank rows = not tested)  A/PROM LEFT   eval  Shoulder extension   Shoulder flexion 123  Shoulder abduction 112  Shoulder internal rotation T7  Shoulder external rotation t3    (Blank rows = not tested)  CERVICAL AROM: All within normal limits:    Percent limited  Flexion Fully flexed  Extension From flexed position decreased 80%  Right lateral flexion   Left lateral flexion   Right rotation Dec 50% sitting in FH posture  Left rotation        Dec 50%    UPPER EXTREMITY STRENGTH:    LOWER EXTREMITY AROM/PROM:  A/PROM Right eval  Hip flexion   Hip extension   Hip abduction   Hip adduction   Hip internal rotation   Hip external rotation   Knee flexion   Knee extension   Ankle dorsiflexion   Ankle plantarflexion   Ankle inversion   Ankle eversion    (Blank rows = not tested)  A/PROM LEFT eval  Hip flexion   Hip extension   Hip abduction   Hip adduction   Hip internal rotation   Hip external rotation   Knee flexion   Knee extension   Ankle dorsiflexion   Ankle plantarflexion   Ankle inversion   Ankle eversion    (Blank rows = not tested)  LOWER EXTREMITY MMT:   LYMPHEDEMA ASSESSMENTS:  SURGERY TYPE/DATE: 8/17/23Left prox humerus fixation for pathologic fx  2/2/2024Fusion T1-T4 for pathologic fx with cord compression 06/04/22 posterior cervical fusion/foraminotomy;T1-4 fusion NUMBER OF LYMPH NODES REMOVED: NA  CHEMOTHERAPY: Velcade/promalyst/Dex;  RADIATION:to  prox humerus and thoracic spine pathologic fx  HORMONE TREATMENT: NA  INFECTIONS: NA  FUNCTIONAL TESTS:    GAIT: Distance walked: 20 Assistive device utilized: carried SPC in left Level of assistance: Complete Independence Comments: feels unsteady, very flexed neck posture   QUICK DASH SURVEY:    TODAY'S TREATMENT:                                                                                                                                         DATE:  10/07/22: Therapeutic Exercises NuStep Level 5 seat 12/UE 7 x 7 mins; SpO2 97% after After Neuro exs supine on mat table for following: Meeks Decompression Exs x 5, 5 sec holds returning therapist demo Supine serratus punch 2 x 10 each Green theraband for bil UE er and horz abd x10 each.  NeuroMuscular Re Ed In // bars: Heel-toe front and retro 4x each; slow, controlled high knee marching 4x each; Seated rest and SpO2 95%; then crossover in front to Rt and Lt 2x each; toe walking 4x with VC's to work on increasing ROM as much as able; then SLS with toe tap front, side and back 5x each side returning therapist demo for all and VC's for erect posture throughout Manual Therapy Ended session with STM to bil UT/upper scap region and cervical muscles with gentle cervical P/ROM  10/04/2022 Pulse 53, O2 98% at rest before exercising 4 position balance test; able to hold DLS feet together , and half tandem stance 10 seconds. Unable to maintain full tandem stance for 10 sec, unable to hold SLS greater than a second or 2 on left, and 4 seconds on right. Green theraband for standing scapular retraction and shoulder extension 2 x 10, sitting bilateral ER 2 x10(reviewed prior HEP) Sitting with towel roll and cervical retractions x 10 Supine with 2 pillows; and 1 towel under head;gently retracting head into therapist hands 2 x 10 reps qith guidance for proper form Supine serratus punch, Right with PT guidance 2 x 10, chest press right 0# with  scapular depress to prevent substitution x 10 Supine STM with cocoa butter to neck and bilateral UT/upper scapular region Check O2 sats several times during exs especially with standing; 95% one occasion and returned quickly to 97, several times 97% but staying in a good place.   09/23/2022  PATIENT EDUCATION:  Education details: sitting posture with lumbar roll Person educated: Patient Education method: Medical illustrator Education comprehension: verbalized understanding and returned demonstration  HOME EXERCISE PROGRAM: Doing HEP from HHPT; cervical ROM, shoulder rolls, shrugs, scap retraction, UT stretches  ASSESSMENT:  CLINICAL IMPRESSION: Progressed pt today to include more standing balance activities which he tolerated well and  his SpO2 stayed in and above the mid 90's. He reports feeling challenged by balance activities. Then continued with supine postural strength. Pt did not report any pain with activities today.   OBJECTIVE IMPAIRMENTS: decreased activity tolerance, decreased balance, difficulty walking, decreased ROM, decreased strength, increased edema, impaired UE functional use, postural dysfunction, and pain.   ACTIVITY LIMITATIONS: carrying, lifting, dressing, reach over head, and locomotion level  PARTICIPATION LIMITATIONS: cleaning, laundry, driving, and occupation  PERSONAL FACTORS:  Multiple Myeloma  are also affecting patient's functional outcome.   REHAB POTENTIAL: Good  CLINICAL DECISION MAKING: Evolving/moderate complexity  EVALUATION COMPLEXITY: Moderate  GOALS: Goals reviewed with patient? Yes  SHORT TERM GOALS: Target date: 10/21/22  Pt will be educated in and independent in proper sitting posture using lumbar roll prn Baseline: Goal status: INITIAL  2.  Pt will report decreased muscle soreness in bilateral upper back x 25% Baseline:  Goal status: INITIAL  3.  Pt will be compliant with a HEP for postural strengthening/ROM Baseline:   Goal status: INITIAL   LONG TERM GOALS: Target date: 11/04/22  Pt will be able to bring his head to neutral position for safety with ambulation/driving Baseline:  Goal status: INITIAL  2.  Pt will have decreased upper back pain by 50% or more Baseline:  Goal status: INITIAL  3.  Pt will be able to flex right shoulder to 115 degrees with minimal compensation for improved reaching ability Baseline:  Goal status: INITIAL 4; Pt will be able to maintain tandem stance on both sides x 10 sec to demonstrated improved balance. Goal status:NEW   PT FREQUENCY: 2x/week  PT DURATION: 6 weeks  PLANNED INTERVENTIONS: Therapeutic exercises, Therapeutic activity, Neuromuscular re-education, Balance training, Gait training, Patient/Family education, Self Care, Joint mobilization, Aquatic Therapy, Manual therapy, and Re-evaluation  PLAN FOR NEXT SESSION: pt to be in pool 1x/week and clinic 1x/week, but may start 2x in clinic if pool not yet available. Add bil LE stertches and low back stretches; 4 position balance test or berg next and make goal prn, postural exercises ( and TB), reinforcement of head and neck position and proper sitting posture,UE strength/ avoiding compensation with right, balance activities, STM upper back/scapular area.   Hermenia Bers, PTA 10/07/2022, 9:50 AM 12/17/21

## 2022-10-12 ENCOUNTER — Ambulatory Visit: Payer: Medicare Other

## 2022-10-12 DIAGNOSIS — C9000 Multiple myeloma not having achieved remission: Secondary | ICD-10-CM | POA: Diagnosis not present

## 2022-10-12 DIAGNOSIS — M6281 Muscle weakness (generalized): Secondary | ICD-10-CM

## 2022-10-12 DIAGNOSIS — G8929 Other chronic pain: Secondary | ICD-10-CM | POA: Diagnosis not present

## 2022-10-12 DIAGNOSIS — S22000A Wedge compression fracture of unspecified thoracic vertebra, initial encounter for closed fracture: Secondary | ICD-10-CM | POA: Diagnosis not present

## 2022-10-12 DIAGNOSIS — M542 Cervicalgia: Secondary | ICD-10-CM

## 2022-10-12 DIAGNOSIS — R293 Abnormal posture: Secondary | ICD-10-CM | POA: Diagnosis not present

## 2022-10-12 DIAGNOSIS — M25511 Pain in right shoulder: Secondary | ICD-10-CM | POA: Diagnosis not present

## 2022-10-12 DIAGNOSIS — R278 Other lack of coordination: Secondary | ICD-10-CM | POA: Diagnosis not present

## 2022-10-12 LAB — MULTIPLE MYELOMA PANEL, SERUM
Albumin SerPl Elph-Mcnc: 2.6 g/dL — ABNORMAL LOW (ref 2.9–4.4)
Albumin/Glob SerPl: 1.1 (ref 0.7–1.7)
Alpha 1: 0.5 g/dL — ABNORMAL HIGH (ref 0.0–0.4)
Alpha2 Glob SerPl Elph-Mcnc: 1 g/dL (ref 0.4–1.0)
B-Globulin SerPl Elph-Mcnc: 0.9 g/dL (ref 0.7–1.3)
Gamma Glob SerPl Elph-Mcnc: 0.3 g/dL — ABNORMAL LOW (ref 0.4–1.8)
Globulin, Total: 2.6 g/dL (ref 2.2–3.9)
IgA: 90 mg/dL (ref 61–437)
IgG (Immunoglobin G), Serum: 300 mg/dL — ABNORMAL LOW (ref 603–1613)
IgM (Immunoglobulin M), Srm: 12 mg/dL — ABNORMAL LOW (ref 15–143)
Total Protein ELP: 5.2 g/dL — ABNORMAL LOW (ref 6.0–8.5)

## 2022-10-12 NOTE — Patient Instructions (Signed)
1. Decompression Exercise     Cancer Rehab [phone]    Lie on back on firm surface, knees bent, feet flat, arms turned up, out to sides, backs of hands down. Time _5-15__ minutes. Surface: floor   2. Shoulder Press    Start in Decompression Exercise position. Press shoulders downward towards supporting surface. Hold __2-3__ seconds while counting out loud. Repeat _3-5___ times. Do _1-2___ times per day.   3. Head Press    Bring cervical spine (neck) into neutral position (by either tucking the chin towards the chest or tilting the chin upward). Feel weight on back of head. Press head downward into supporting surface.    Hold _2-3__ seconds. Repeat _3-5__ times. Do _1-2__ times per day.   4. Leg Lengthener    Straighten one leg. Pull toes AND forefoot toward knee, extend heel. Lengthen leg by pulling pelvis away from ribs. Hold _2-3__ seconds. Relax. Repeat __4-6__ times. Do other leg.  Surface: floor   5. Leg Press    Straighten one leg down to floor keeping leg aligned with hip. Pull toes AND forefoot toward knee; extend heel.  Press entire leg downward (as if pressing leg into sandy beach). DO NOT BEND KNEE. Hold _2-3__ seconds. Do __4-6__ times. Repeat with other leg.    

## 2022-10-12 NOTE — Therapy (Signed)
OUTPATIENT PHYSICAL THERAPY ONCOLOGY TREATMENT  Travis [name] Name: Travis [name] MRN: [id number] DOB:Feb 22, 1951, 72 y.o., male Today's Date: 10/12/2022  END OF SESSION:  PT End of Session - 10/12/22 1107     Visit Number 4    Number of Visits 16    Date for PT Re-Evaluation 11/04/22    PT Start Time 1108    PT Stop Time 1200    PT Time Calculation (min) 52 min    Activity Tolerance Travis [name] tolerated treatment well    Behavior During Therapy WFL for tasks assessed/performed             Past Medical History:  Diagnosis Date   A-fib (HCC)    Arthritis    "comes and goes; mostly in hands, occasionally elbow" (04/05/2017)   Complication of anesthesia    "just wanted to sleep alot  for couple days S/P VARICOCELE OR" (04/05/2017)   DVT (deep venous thrombosis) (HCC)    LLE   Dysrhythmia    PVCs    GERD (gastroesophageal reflux disease)    History of hiatal hernia    History of radiation therapy    Thoracic Spine- 07/13/22-07/23/22-Dr. Antony Blackbird   Hypertension    Impaired glucose tolerance    Multiple myeloma (HCC)    Obesity (BMI 30-39.9) 07/26/2016   OSA on CPAP 07/26/2016   Mild with AHI 12.5/hr now on CPAP at 14cm H2O   PAF (paroxysmal atrial fibrillation) (HCC)    s/p PVI Ablation in 2018 // Flecainide Rx // Echo 8/21: EF 60-65, no RWMA, mild LVH, normal RV SF, moderate RVE, mild BAE, trivial MR, mild dilation of aortic root (40 mm)   Pneumonia    "may have had walking pneumonia a few years ago" (04/05/2017)   Pre-diabetes    Thrombophlebitis    Past Surgical History:  Procedure Laterality Date   ATRIAL FIBRILLATION ABLATION  04/05/2017   ATRIAL FIBRILLATION ABLATION N/A 04/05/2017   Procedure: ATRIAL FIBRILLATION ABLATION;  Surgeon: [name], MD;  Location: [facility];  Service: Cardiovascular;  Laterality: N/A;   BONE BIOPSY Right 12/17/2021   Procedure: BONE BIOPSY;  Surgeon: [name], MD;  Location: [facility];  Service:  Orthopedics;  Laterality: Right;   COMPLETE RIGHT HIP REPLACMENT  01/19/2019   EVALUATION UNDER ANESTHESIA WITH HEMORRHOIDECTOMY N/A 02/24/2017   Procedure: EXAM UNDER ANESTHESIA WITH  HEMORRHOIDECTOMY;  Surgeon: [name], MD;  Location: [facility];  Service: General;  Laterality: N/A;   EXCISIONAL TOTAL HIP ARTHROPLASTY WITH ANTIBIOTIC SPACERS Right 09/25/2020   Procedure: EXCISIONAL TOTAL HIP ARTHROPLASTY WITH ANTIBIOTIC SPACERS;  Surgeon: [name], MD;  Location: [facility];  Service: Orthopedics;  Laterality: Right;   FLEXIBLE SIGMOIDOSCOPY N/A 04/15/2015   Procedure:  UNSEDATED FLEXIBLE SIGMOIDOSCOPY;  Surgeon: [name], MD;  Location: [facility];  Service: Endoscopy;  Laterality: N/A;   HUMERUS IM NAIL Right 12/17/2021   Procedure: INTRAMEDULLARY (IM) NAIL HUMERAL;  Surgeon: [name], MD;  Location: [facility];  Service: Orthopedics;  Laterality: Right;   KNEE ARTHROSCOPY Left ~ 2016   POSTERIOR CERVICAL FUSION/FORAMINOTOMY N/A 06/04/2022   Procedure: Thoracic one - Thoracic Four Posterior lateral arthrodesis/ fusion and Thoracic two Corpectomy;  Surgeon: [name], MD;  Location: [facility];  Service: Neurosurgery;  Laterality: N/A;   REIMPLANTATION OF TOTAL HIP Right 12/25/2020   Procedure: REIMPLANTATION/REVISION OF RIGHT TOTAL HIP VERSUS REPEAT IRRIGATION AND DEBRIDEMENT;  Surgeon: [name], MD;  Location: [facility];  Service:  Orthopedics;  Laterality: Right;   TESTICLE SURGERY  1988   VARICOCELE   TONSILLECTOMY     VARICOSE VEIN SURGERY Bilateral    Travis [name] Active Problem List   Diagnosis Date Noted   Pancreatic cyst 09/30/2022   Infection due to novel influenza A virus 09/29/2022   Persistent atrial fibrillation (HCC) 07/15/2022   Acute on chronic diastolic heart failure (HCC) 07/03/2022   Pulmonary infiltrates 07/01/2022   Acute respiratory failure with hypoxia (HCC) 07/01/2022   Hyponatremia 06/29/2022   History of thoracic surgery 06/29/2022    Acute DVT of left tibial vein (HCC) 06/21/2022   Hypokalemia 06/21/2022   Vitamin D deficiency 05/30/2022   Other constipation 05/29/2022   Pathologic compression fracture of spine, initial encounter (HCC) 05/29/2022   Cord compression (HCC) 05/28/2022   Degenerative cervical disc 05/14/2022   Hypercoagulable state due to persistent atrial fibrillation (HCC) 01/11/2022   Multiple myeloma (HCC) 12/10/2021   Medication monitoring encounter 02/27/2021   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   Status post peripherally inserted central catheter (PICC) central line placement 11/06/2020   Drug rash with eosinophilia and systemic symptoms syndrome 11/06/2020   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   PAF (paroxysmal atrial fibrillation) (HCC)    GERD (gastroesophageal reflux disease) 03/29/2018   Hiatal hernia 03/29/2018   History of DVT (deep vein thrombosis) 03/29/2018   CAP (community acquired pneumonia) 03/29/2018   Osteoarthritis 03/29/2018   Neuropathy 08/29/2017   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   Paroxysmal atrial fibrillation with rapid ventricular response (HCC) 04/05/2017   Prolapsed internal hemorrhoids, grade 4, s/p ligation/pexy/hemorrhoidectomy x 2 02/24/2017 02/24/2017   OSA (obstructive sleep apnea) 07/26/2016   Obesity (BMI 30-39.9) 07/26/2016   Atrial fibrillation with RVR (HCC)    Essential hypertension    Chest pain at rest     PCP: Eleanora Neighbor, MD  REFERRING PROVIDER: Antony Blackbird, MD  REFERRING DIAG: Multiple Myeloma  THERAPY DIAG:  Compression fracture of thoracic vertebra, unspecified thoracic vertebral level, initial encounter (HCC)  Multiple myeloma not having achieved remission (HCC)  Muscle weakness (generalized)  Neck pain  Abnormal posture  ONSET DATE: 2023  Rationale for Evaluation and Treatment: Rehabilitation  SUBJECTIVE:                                                                                                                                                                                            SUBJECTIVE STATEMENT:  Tight and sore when I finished last time, but no worse. Tight in shoulder blades and neck, and LB. My head comes up a little each day.   PERTINENT  HISTORY:  GILSON [name] 72 y.o. male with medical history significant for IgA Kappa Multiple Myeloma. He was diagnosed with smouldering myeloma in 2018 . Last July he developed right arm pain due to a pathologic fx and he underwent radiation and fixation. In early Feb 2024 he had radiation for pathologic T2 compression fx followed by a T1-T4 fusion. PAIN:  Are you having pain? PAIN:  Are you having pain? Yes NPRS scale: 2-3/10 Pain location: shoulder blades and neck, right thoracic paraspinals Pain orientation: Bilateral  PAIN TYPE: aching and tight Pain description: constant  Aggravating factors: getting up and down, looking down Relieving factors: sleeping, laying down.   PRECAUTIONS: Multiple Myeloma with mets to R ribs, R humerus with ORIF for pathological fracture 12/17/21. Lesions have also been found in R rib and also in his skull. Pathologic fx T2 with fusion T1-4 on 06/04/22, Bilateral LE edema (on Lasix), Afib   WEIGHT BEARING RESTRICTIONS: No  FALLS:  Has Travis [name] fallen in last 6 months? Yes. Number of falls 1  LIVING ENVIRONMENT: Lives with: lives with their family and lives with their spouse Lives in: House/apartment Stairs: Yes; Internal: 14 steps; on left going up and External: 3 steps; on right going up Has following equipment at home: Single point cane and Walker - 4 wheeled  OCCUPATION: attorney/ mostly retired?  LEISURE: golf (can't do now.), use to play racquetball  HAND DOMINANCE: right   PRIOR LEVEL OF FUNCTION: Independent  POSTURE:significant slump sitting posture with forward head  Travis [name] GOALS: decrease pain,get stronger,improve balance   OBJECTIVE:  COGNITION: Overall  cognitive status: Within functional limits for tasks assessed   PALPATION: Very tender bilateral UT/levator, scapular regions  OBSERVATIONS / OTHER ASSESSMENTS: very flexed head and neck with chin nearly at chest  SENSATION: Light touch: Appears intact    POSTURE: significant forward head, round shoulders, increased kyphosis   Gait holding single point cane in left hand but not using. Right shoulder low, flexed neck;difficulty raising head to see where he is going  UPPER EXTREMITY AROM/PROM:  A/PROM RIGHT   eval   Shoulder extension   Shoulder flexion 91 with compensation  Shoulder abduction 67 with compensation  Shoulder internal rotation   Shoulder external rotation C6    (Blank rows = not tested)  A/PROM LEFT   eval  Shoulder extension   Shoulder flexion 123  Shoulder abduction 112  Shoulder internal rotation T7  Shoulder external rotation t3    (Blank rows = not tested)  CERVICAL AROM: All within normal limits:    Percent limited  Flexion Fully flexed  Extension From flexed position decreased 80%  Right lateral flexion   Left lateral flexion   Right rotation Dec 50% sitting in FH posture  Left rotation        Dec 50%    UPPER EXTREMITY STRENGTH:    LOWER EXTREMITY AROM/PROM:  A/PROM Right eval  Hip flexion   Hip extension   Hip abduction   Hip adduction   Hip internal rotation   Hip external rotation   Knee flexion   Knee extension   Ankle dorsiflexion   Ankle plantarflexion   Ankle inversion   Ankle eversion    (Blank rows = not tested)  A/PROM LEFT eval  Hip flexion   Hip extension   Hip abduction   Hip adduction   Hip internal rotation   Hip external rotation   Knee flexion   Knee extension   Ankle dorsiflexion   Ankle  plantarflexion   Ankle inversion   Ankle eversion    (Blank rows = not tested)  LOWER EXTREMITY MMT:   LYMPHEDEMA ASSESSMENTS:   SURGERY TYPE/DATE: 8/17/23Left prox humerus fixation for pathologic fx   2/2/2024Fusion T1-T4 for pathologic fx with cord compression 06/04/22 posterior cervical fusion/foraminotomy;T1-4 fusion NUMBER OF LYMPH NODES REMOVED: NA  CHEMOTHERAPY: Velcade/promalyst/Dex;  RADIATION:to prox humerus and thoracic spine pathologic fx  HORMONE TREATMENT: NA  INFECTIONS: NA  FUNCTIONAL TESTS:    GAIT: Distance walked: 20 Assistive device utilized: carried SPC in left Level of assistance: Complete Independence Comments: feels unsteady, very flexed neck posture   QUICK DASH SURVEY:    TODAY'S TREATMENT:                                                                                                                                         DATE:  10/12/22 NuStep Level 5 seat 12/UE 7 x 7 mins; SpO2 97% after After Neuro exs supine on mat table for following: Meeks Decompression Exs x 5, 5 sec holds returning therapist demo Supine serratus punch 1x 10 each Incline stretch in bars 3 x 10 sec Standing heel raises with light finger touch x 10, standing march x 10 light HH Step onto ax pad x 10 ea side, Sides tep x 5 ea Ended session with STM to bil UT/upper scap region and cervical muscles and bilateral paraspinal muscles in prop sitting Ipdated HEP with Meeks decompression  10/07/22: Therapeutic Exercises NuStep Level 5 seat 12/UE 7 x 7 mins; SpO2 97% after After Neuro exs supine on mat table for following: Meeks Decompression Exs x 5, 5 sec holds returning therapist demo Supine serratus punch 2 x 10 each Green theraband for bil UE er and horz abd x10 each.  NeuroMuscular Re Ed In // bars: Heel-toe front and retro 4x each; slow, controlled high knee marching 4x each; Seated rest and SpO2 95%; then crossover in front to Rt and Lt 2x each; toe walking 4x with VC's to work on increasing ROM as much as able; then SLS with toe tap front, side and back 5x each side returning therapist demo for all and VC's for erect posture throughout Manual Therapy Ended session with STM  to bil UT/upper scap region and cervical muscles with gentle cervical P/ROM  10/04/2022 Pulse 53, O2 98% at rest before exercising 4 position balance test; able to hold DLS feet together , and half tandem stance 10 seconds. Unable to maintain full tandem stance for 10 sec, unable to hold SLS greater than a second or 2 on left, and 4 seconds on right. Green theraband for standing scapular retraction and shoulder extension 2 x 10, sitting bilateral ER 2 x10(reviewed prior HEP) Sitting with towel roll and cervical retractions x 10 Supine with 2 pillows; and 1 towel under head;gently retracting head into therapist hands 2 x 10 reps qith guidance for proper  form Supine serratus punch, Right with PT guidance 2 x 10, chest press right 0# with scapular depress to prevent substitution x 10 Supine STM with cocoa butter to neck and bilateral UT/upper scapular region Check O2 sats several times during exs especially with standing; 95% one occasion and returned quickly to 97, several times 97% but staying in a good place.   09/23/2022  Travis [name] EDUCATION:  Education details: sitting posture with lumbar roll Person educated: Travis [name] Education method: Medical illustrator Education comprehension: verbalized understanding and returned demonstration  HOME EXERCISE PROGRAM: Doing HEP from HHPT; cervical ROM, shoulder rolls, shrugs, scap retraction, UT stretches  ASSESSMENT:  CLINICAL IMPRESSION: Pt continues to have difficulty with erect posture and complains of tightness pain in UT, neck and paraspinals. He did well with standing exs with noted balance difficulties. He felt better after manual STM to neck and thoracic region   OBJECTIVE IMPAIRMENTS: decreased activity tolerance, decreased balance, difficulty walking, decreased ROM, decreased strength, increased edema, impaired UE functional use, postural dysfunction, and pain.   ACTIVITY LIMITATIONS: carrying, lifting, dressing, reach over head,  and locomotion level  PARTICIPATION LIMITATIONS: cleaning, laundry, driving, and occupation  PERSONAL FACTORS:  Multiple Myeloma  are also affecting Travis [name]'s functional outcome.   REHAB POTENTIAL: Good  CLINICAL DECISION MAKING: Evolving/moderate complexity  EVALUATION COMPLEXITY: Moderate  GOALS: Goals reviewed with Travis [name]? Yes  SHORT TERM GOALS: Target date: 10/21/22  Pt will be educated in and independent in proper sitting posture using lumbar roll prn Baseline: Goal status: INITIAL  2.  Pt will report decreased muscle soreness in bilateral upper back x 25% Baseline:  Goal status: INITIAL  3.  Pt will be compliant with a HEP for postural strengthening/ROM Baseline:  Goal status: INITIAL   LONG TERM GOALS: Target date: 11/04/22  Pt will be able to bring his head to neutral position for safety with ambulation/driving Baseline:  Goal status: INITIAL  2.  Pt will have decreased upper back pain by 50% or more Baseline:  Goal status: INITIAL  3.  Pt will be able to flex right shoulder to 115 degrees with minimal compensation for improved reaching ability Baseline:  Goal status: INITIAL 4; Pt will be able to maintain tandem stance on both sides x 10 sec to demonstrated improved balance. Goal status:NEW   PT FREQUENCY: 2x/week  PT DURATION: 6 weeks  PLANNED INTERVENTIONS: Therapeutic exercises, Therapeutic activity, Neuromuscular re-education, Balance training, Gait training, Travis [name]/Family education, Self Care, Joint mobilization, Aquatic Therapy, Manual therapy, and Re-evaluation  PLAN FOR NEXT SESSION: pt to be in pool 1x/week and clinic 1x/week, but may start 2x in clinic if pool not yet available. Add bil LE stertches and low back stretches; 4 position balance test or berg next and make goal prn, postural exercises ( and TB), reinforcement of head and neck position and proper sitting posture,UE strength/ avoiding compensation with right, balance activities, STM  upper back/scapular area.   Waynette Buttery, PT 10/12/2022, 12:03 PM 12/17/21

## 2022-10-13 ENCOUNTER — Other Ambulatory Visit: Payer: Self-pay

## 2022-10-13 ENCOUNTER — Inpatient Hospital Stay: Payer: Medicare Other

## 2022-10-13 ENCOUNTER — Encounter: Payer: Self-pay | Admitting: Hematology and Oncology

## 2022-10-13 ENCOUNTER — Inpatient Hospital Stay (HOSPITAL_BASED_OUTPATIENT_CLINIC_OR_DEPARTMENT_OTHER): Payer: Medicare Other | Admitting: Physician Assistant

## 2022-10-13 VITALS — BP 171/77 | HR 58 | Temp 98.1°F | Resp 17 | Wt 245.4 lb

## 2022-10-13 VITALS — BP 148/68

## 2022-10-13 DIAGNOSIS — M5126 Other intervertebral disc displacement, lumbar region: Secondary | ICD-10-CM | POA: Diagnosis not present

## 2022-10-13 DIAGNOSIS — R0600 Dyspnea, unspecified: Secondary | ICD-10-CM | POA: Diagnosis not present

## 2022-10-13 DIAGNOSIS — I480 Paroxysmal atrial fibrillation: Secondary | ICD-10-CM | POA: Diagnosis not present

## 2022-10-13 DIAGNOSIS — C9000 Multiple myeloma not having achieved remission: Secondary | ICD-10-CM

## 2022-10-13 DIAGNOSIS — Z7952 Long term (current) use of systemic steroids: Secondary | ICD-10-CM | POA: Diagnosis not present

## 2022-10-13 DIAGNOSIS — Z7961 Long term (current) use of immunomodulator: Secondary | ICD-10-CM | POA: Diagnosis not present

## 2022-10-13 DIAGNOSIS — I119 Hypertensive heart disease without heart failure: Secondary | ICD-10-CM | POA: Diagnosis not present

## 2022-10-13 DIAGNOSIS — R1310 Dysphagia, unspecified: Secondary | ICD-10-CM | POA: Diagnosis not present

## 2022-10-13 DIAGNOSIS — Z7901 Long term (current) use of anticoagulants: Secondary | ICD-10-CM | POA: Diagnosis not present

## 2022-10-13 DIAGNOSIS — K760 Fatty (change of) liver, not elsewhere classified: Secondary | ICD-10-CM | POA: Diagnosis not present

## 2022-10-13 DIAGNOSIS — E876 Hypokalemia: Secondary | ICD-10-CM | POA: Diagnosis not present

## 2022-10-13 DIAGNOSIS — Z923 Personal history of irradiation: Secondary | ICD-10-CM | POA: Diagnosis not present

## 2022-10-13 DIAGNOSIS — Z515 Encounter for palliative care: Secondary | ICD-10-CM | POA: Diagnosis not present

## 2022-10-13 DIAGNOSIS — M533 Sacrococcygeal disorders, not elsewhere classified: Secondary | ICD-10-CM | POA: Diagnosis not present

## 2022-10-13 DIAGNOSIS — I700 Atherosclerosis of aorta: Secondary | ICD-10-CM | POA: Diagnosis not present

## 2022-10-13 DIAGNOSIS — M542 Cervicalgia: Secondary | ICD-10-CM

## 2022-10-13 DIAGNOSIS — Z79899 Other long term (current) drug therapy: Secondary | ICD-10-CM | POA: Diagnosis not present

## 2022-10-13 DIAGNOSIS — J90 Pleural effusion, not elsewhere classified: Secondary | ICD-10-CM | POA: Diagnosis not present

## 2022-10-13 DIAGNOSIS — R600 Localized edema: Secondary | ICD-10-CM | POA: Diagnosis not present

## 2022-10-13 DIAGNOSIS — G4733 Obstructive sleep apnea (adult) (pediatric): Secondary | ICD-10-CM | POA: Diagnosis not present

## 2022-10-13 DIAGNOSIS — M8580 Other specified disorders of bone density and structure, unspecified site: Secondary | ICD-10-CM | POA: Diagnosis not present

## 2022-10-13 DIAGNOSIS — Z86718 Personal history of other venous thrombosis and embolism: Secondary | ICD-10-CM | POA: Diagnosis not present

## 2022-10-13 DIAGNOSIS — Z881 Allergy status to other antibiotic agents status: Secondary | ICD-10-CM | POA: Diagnosis not present

## 2022-10-13 DIAGNOSIS — R21 Rash and other nonspecific skin eruption: Secondary | ICD-10-CM | POA: Diagnosis not present

## 2022-10-13 DIAGNOSIS — Z5112 Encounter for antineoplastic immunotherapy: Secondary | ICD-10-CM | POA: Diagnosis not present

## 2022-10-13 LAB — CBC WITH DIFFERENTIAL (CANCER CENTER ONLY)
Abs Immature Granulocytes: 0.06 10*3/uL (ref 0.00–0.07)
Basophils Absolute: 0 10*3/uL (ref 0.0–0.1)
Basophils Relative: 0 %
Eosinophils Absolute: 0 10*3/uL (ref 0.0–0.5)
Eosinophils Relative: 0 %
HCT: 34.6 % — ABNORMAL LOW (ref 39.0–52.0)
Hemoglobin: 11 g/dL — ABNORMAL LOW (ref 13.0–17.0)
Immature Granulocytes: 1 %
Lymphocytes Relative: 4 %
Lymphs Abs: 0.5 10*3/uL — ABNORMAL LOW (ref 0.7–4.0)
MCH: 31.1 pg (ref 26.0–34.0)
MCHC: 31.8 g/dL (ref 30.0–36.0)
MCV: 97.7 fL (ref 80.0–100.0)
Monocytes Absolute: 0.2 10*3/uL (ref 0.1–1.0)
Monocytes Relative: 2 %
Neutro Abs: 10.1 10*3/uL — ABNORMAL HIGH (ref 1.7–7.7)
Neutrophils Relative %: 93 %
Platelet Count: 255 10*3/uL (ref 150–400)
RBC: 3.54 MIL/uL — ABNORMAL LOW (ref 4.22–5.81)
RDW: 16.3 % — ABNORMAL HIGH (ref 11.5–15.5)
WBC Count: 10.8 10*3/uL — ABNORMAL HIGH (ref 4.0–10.5)
nRBC: 0 % (ref 0.0–0.2)

## 2022-10-13 LAB — CMP (CANCER CENTER ONLY)
ALT: 18 U/L (ref 0–44)
AST: 14 U/L — ABNORMAL LOW (ref 15–41)
Albumin: 3.6 g/dL (ref 3.5–5.0)
Alkaline Phosphatase: 98 U/L (ref 38–126)
Anion gap: 5 (ref 5–15)
BUN: 14 mg/dL (ref 8–23)
CO2: 29 mmol/L (ref 22–32)
Calcium: 8.7 mg/dL — ABNORMAL LOW (ref 8.9–10.3)
Chloride: 107 mmol/L (ref 98–111)
Creatinine: 0.64 mg/dL (ref 0.61–1.24)
GFR, Estimated: >60 mL/min (ref >60)
Glucose, Bld: 129 mg/dL — ABNORMAL HIGH (ref 70–99)
Potassium: 3.8 mmol/L (ref 3.5–5.1)
Sodium: 141 mmol/L (ref 135–145)
Total Bilirubin: 0.3 mg/dL (ref 0.3–1.2)
Total Protein: 6.2 g/dL — ABNORMAL LOW (ref 6.5–8.1)

## 2022-10-13 MED ORDER — BORTEZOMIB CHEMO SQ INJECTION 3.5 MG (2.5MG/ML)
1.3000 mg/m2 | Freq: Once | INTRAMUSCULAR | Status: AC
  Administered 2022-10-13: 3 mg via SUBCUTANEOUS
  Filled 2022-10-13: qty 1.2

## 2022-10-13 NOTE — Patient Instructions (Signed)
[facility]  Discharge Instructions: Thank you for choosing [facility] to provide your oncology and hematology care.   If you have a lab appointment with the Cancer Center, please go directly to the Cancer Center and check in at the registration area.   Wear comfortable clothing and clothing appropriate for easy access to any Portacath or PICC line.   We strive to give you quality time with your provider. You may need to reschedule your appointment if you arrive late (15 or more minutes).  Arriving late affects you and other patients whose appointments are after yours.  Also, if you miss three or more appointments without notifying the office, you may be dismissed from the clinic at the provider's discretion.      For prescription refill requests, have your pharmacy contact our office and allow 72 hours for refills to be completed.    Today you received the following chemotherapy and/or immunotherapy agents: Velcade     To help prevent nausea and vomiting after your treatment, we encourage you to take your nausea medication as directed.  BELOW ARE SYMPTOMS THAT SHOULD BE REPORTED IMMEDIATELY: *FEVER GREATER THAN 100.4 F (38 C) OR HIGHER *CHILLS OR SWEATING *NAUSEA AND VOMITING THAT IS NOT CONTROLLED WITH YOUR NAUSEA MEDICATION *UNUSUAL SHORTNESS OF BREATH *UNUSUAL BRUISING OR BLEEDING *URINARY PROBLEMS (pain or burning when urinating, or frequent urination) *BOWEL PROBLEMS (unusual diarrhea, constipation, pain near the anus) TENDERNESS IN MOUTH AND THROAT WITH OR WITHOUT PRESENCE OF ULCERS (sore throat, sores in mouth, or a toothache) UNUSUAL RASH, SWELLING OR PAIN  UNUSUAL VAGINAL DISCHARGE OR ITCHING   Items with * indicate a potential emergency and should be followed up as soon as possible or go to the Emergency Department if any problems should occur.  Please show the CHEMOTHERAPY ALERT CARD or IMMUNOTHERAPY ALERT CARD at check-in  to the Emergency Department and triage nurse.  Should you have questions after your visit or need to cancel or reschedule your appointment, please contact [facility]  Dept: [phone]  and follow the prompts.  Office hours are 8:00 a.m. to 4:30 p.m. Monday - Friday. Please note that voicemails left after 4:00 p.m. may not be returned until the following business day.  We are closed weekends and major holidays. You have access to a nurse at all times for urgent questions. Please call the main number to the clinic Dept: [phone] and follow the prompts.   For any non-urgent questions, you may also contact your provider using MyChart. We now offer e-Visits for anyone 18 and older to request care online for non-urgent symptoms. For details visit [email or web address].   Also download the MyChart app! Go to the app store, search "MyChart", open the app, select [facility], and log in with your MyChart username and password.   

## 2022-10-13 NOTE — Progress Notes (Signed)
[facility] [phone]   [phone]  PROGRESS NOTE  Patient Care Team: [name], MD as PCP - General (Internal Medicine) Pricilla Riffle, MD as PCP - Cardiology (Cardiology) Quintella Reichert, MD as PCP - Sleep Medicine (Cardiology) Lanier Prude, MD as PCP - Electrophysiology (Cardiology) [name], MD as Consulting Physician (General Surgery) Vida Rigger, MD as Consulting Physician (Gastroenterology) Quintella Reichert, MD as Consulting Physician (Sleep Medicine) Glendale Chard, DO as Consulting Physician (Neurology)  Hematological/Oncological History # IgA Kappa Multiple Myeloma 05/05/2020: Bone marrow biopsy showed increased number of plasma cells representing 14% of all cells in the aspirate associated with numerous variably sized clusters in the clot and biopsy sections 04/10/2021: M protein 0.3, IgA kappa specificity. Kappa 580.5, Labda 7.0, ratio 82.93.   12/07/2021: Labs show of 1971.3, lambda 5.5, ratio 358.42 12/10/2021: Transition care to Dr. Leonides Schanz. 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  01/26/2022: Cycle 2 Day 1 of Dara/Rev/Dex 02/22/2022: Cycle 3 Day 1 of Dara/Rev/Dex 03/23/2022: Cycle 4 Day 1 of Dara/Rev/Dex 04/20/2022: Cycle 5 Day 1 of Dara/Rev/Dex 05/18/2022: Cycle 6 Day 1 of Dara/Rev/Dex 05/27/2022: MRI cervical/thoracic/lumbar: pathologic fracture at T2 with significant osseous retropulsion and resulting cord compression. Additional smaller lesions within T1 vertebral body, right C7 and T3 articulating facets.  Small Multiple Myeloma lesions scattered at all lumbar levels and in the visible left sacral ala. Largest lumbar tumor is 2.3 cm in the left L1 posterior elements and pedicle. No lumbar extraosseous or epidural tumor extension, or pathologic fracture.Underlying chronic lumbar spine  degeneration, with a new central disc herniation at L3-L4 since 2021 now resulting in mild to moderate degenerative spinal stenosis there. 05/28/2022-06/09/2022: Admitted for T2 pathologic fracture and cord compression. He received urgent radiation therapy for a total of 3.0 Gy. He underwent  thoracic 1 through 4 posterior lateral arthrodesis/fusion and 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  06/23/2022: Cycle 1 Day 1 of Velcade/Pomalyst/Dex 08/04/2022: Cycle 2 Day 1 of Velcade/Pomalyst/Dex. Pomalyst to be decreased to 2mg  PO daily due to cytopenias.  09/01/2022: Cycle 3 Day 1 of Velcade/Pomalyst/Dex. (Missed 2nd dose of cycle due to hospitalization)  10/06/2022: Cycle 4 Day 1 of Velcade/Pomalyst/Dex.   Interval History:  Travis [name] 72 y.o. male with medical history significant for IgA Kappa Multiple Myeloma who presents for a follow up visit. He was last seen on 10/06/2022. In the interim, he continued on Velcade/Pomalyst/Dex therapy.  On exam today Travis [name] reports that he continues to tolerate his treatment. He does have worsening neck pain and tightness along with mid back pain since hospital discharge. He reports a new rash on his right forearm around where he had an IV access. He still has a rash on his forehead which he applies topical cortisone cream. He is back in physical therapy and is trying to go to the gym 1-2 times a week. He denies nausea, vomiting or abdominal pain. Bowel habits are unchanged without recurrent episodes of diarrhea or constipation. His lower extremity edema is stable. He denies fevers, chills, sweats, shortness of breath, chest pain or cough. He has no other complaints. Rest of the 10 point ROS is below.    Overall he is willing and able to proceed with treatment today.  MEDICAL HISTORY:  Past Medical History:  Diagnosis Date   A-fib [facility])    Arthritis    "  comes and goes; mostly in hands, occasionally elbow" (04/05/2017)   Complication of  anesthesia    "just wanted to sleep alot  for couple days S/P VARICOCELE OR" (04/05/2017)   DVT (deep venous thrombosis) (HCC)    LLE   Dysrhythmia    PVCs    GERD (gastroesophageal reflux disease)    History of hiatal hernia    History of radiation therapy    Thoracic Spine- 07/13/22-07/23/22-Dr. Antony Blackbird   Hypertension    Impaired glucose tolerance    Multiple myeloma (HCC)    Obesity (BMI 30-39.9) 07/26/2016   OSA on CPAP 07/26/2016   Mild with AHI 12.5/hr now on CPAP at 14cm H2O   PAF (paroxysmal atrial fibrillation) (HCC)    s/p PVI Ablation in 2018 // Flecainide Rx // Echo 8/21: EF 60-65, no RWMA, mild LVH, normal RV SF, moderate RVE, mild BAE, trivial MR, mild dilation of aortic root (40 mm)   Pneumonia    "may have had walking pneumonia a few years ago" (04/05/2017)   Pre-diabetes    Thrombophlebitis     SURGICAL HISTORY: Past Surgical History:  Procedure Laterality Date   ATRIAL FIBRILLATION ABLATION  04/05/2017   ATRIAL FIBRILLATION ABLATION N/A 04/05/2017   Procedure: ATRIAL FIBRILLATION ABLATION;  Surgeon: [name], MD;  Location: [facility];  Service: Cardiovascular;  Laterality: N/A;   BONE BIOPSY Right 12/17/2021   Procedure: BONE BIOPSY;  Surgeon: [name], MD;  Location: [facility];  Service: Orthopedics;  Laterality: Right;   COMPLETE RIGHT HIP REPLACMENT  01/19/2019   EVALUATION UNDER ANESTHESIA WITH HEMORRHOIDECTOMY N/A 02/24/2017   Procedure: EXAM UNDER ANESTHESIA WITH  HEMORRHOIDECTOMY;  Surgeon: [name], MD;  Location: [facility];  Service: General;  Laterality: N/A;   EXCISIONAL TOTAL HIP ARTHROPLASTY WITH ANTIBIOTIC SPACERS Right 09/25/2020   Procedure: EXCISIONAL TOTAL HIP ARTHROPLASTY WITH ANTIBIOTIC SPACERS;  Surgeon: [name], MD;  Location: [facility];  Service: Orthopedics;  Laterality: Right;   FLEXIBLE SIGMOIDOSCOPY N/A 04/15/2015   Procedure:  UNSEDATED FLEXIBLE SIGMOIDOSCOPY;  Surgeon: [name], MD;   Location: [facility];  Service: Endoscopy;  Laterality: N/A;   HUMERUS IM NAIL Right 12/17/2021   Procedure: INTRAMEDULLARY (IM) NAIL HUMERAL;  Surgeon: [name], MD;  Location: [facility];  Service: Orthopedics;  Laterality: Right;   KNEE ARTHROSCOPY Left ~ 2016   POSTERIOR CERVICAL FUSION/FORAMINOTOMY N/A 06/04/2022   Procedure: Thoracic one - Thoracic Four Posterior lateral arthrodesis/ fusion and Thoracic two Corpectomy;  Surgeon: [name], MD;  Location: [facility];  Service: Neurosurgery;  Laterality: N/A;   REIMPLANTATION OF TOTAL HIP Right 12/25/2020   Procedure: REIMPLANTATION/REVISION OF RIGHT TOTAL HIP VERSUS REPEAT IRRIGATION AND DEBRIDEMENT;  Surgeon: [name], MD;  Location: [facility];  Service: Orthopedics;  Laterality: Right;   TESTICLE SURGERY  1988   VARICOCELE   TONSILLECTOMY     VARICOSE VEIN SURGERY Bilateral     SOCIAL HISTORY: Social History   Socioeconomic History   Marital status: Married    Spouse name: Not on file   Number of children: 1   Years of education: law school   Highest education level: Not on file  Occupational History   Occupation: lawyer  Tobacco Use   Smoking status: Never   Smokeless tobacco: Never  Vaping Use   Vaping Use: Never used  Substance and Sexual Activity   Alcohol use: Yes    Alcohol/week: 2.0 standard drinks of alcohol  Types: 2 Glasses of wine per week    Comment: 2 glasses of wine a week, occ bourbon, beer   Drug use: No   Sexual activity: Not Currently  Other Topics Concern   Not on file  Social History Narrative   Lives in [place] with spouse in a 2 story home.  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 1 daughter.     Education: Social worker school.    Social Determinants of Health   Financial Resource Strain: Not on file  Food Insecurity: No Food Insecurity (09/27/2022)   Hunger Vital Sign    Worried About Running Out of Food in the Last Year: Never true    Ran Out of Food  in the Last Year: Never true  Transportation Needs: No Transportation Needs (09/27/2022)   PRAPARE - Administrator, Civil Service (Medical): No    Lack of Transportation (Non-Medical): No  Physical Activity: Not on file  Stress: Not on file  Social Connections: Not on file  Intimate Partner Violence: Not At Risk (09/27/2022)   Humiliation, Afraid, Rape, and Kick questionnaire    Fear of Current or Ex-Partner: No    Emotionally Abused: No    Physically Abused: No    Sexually Abused: No    FAMILY HISTORY: Family History  Problem Relation Age of Onset   Dementia Mother    Stroke Father    Atrial fibrillation Father    Hypertension Father    Hypertension Paternal Grandfather    Dementia Maternal Grandmother     ALLERGIES:  is allergic to linezolid and vancomycin.  MEDICATIONS:  Current Outpatient Medications  Medication Sig Dispense Refill   acyclovir (ZOVIRAX) 400 MG tablet Take 1 tablet (400 mg total) by mouth 2 (two) times daily. 180 tablet 3   albuterol (VENTOLIN HFA) 108 (90 Base) MCG/ACT inhaler Inhale 2 puffs into the lungs every 6 (six) hours as needed for wheezing or shortness of breath. 8 g 0   amiodarone (PACERONE) 200 MG tablet Take 1 tablet (200 mg total) by mouth daily. Start after finishing 40 mg twice daily dose of  days (Patient taking differently: Take 200 mg by mouth daily.) 180 tablet 1   apixaban (ELIQUIS) 5 MG TABS tablet Take 1 tablet (5 mg total) by mouth 2 (two) times daily. 60 tablet 11   cholecalciferol (VITAMIN D3) 25 MCG (1000 UNIT) tablet Take 1,000 Units by mouth daily.     Cyanocobalamin (VITAMIN B-12 PO) Take 1,000 mcg by mouth daily.     dexamethasone (DECADRON) 4 MG tablet Take 10 tablets (40 mg total) by mouth every 7 (seven) days. Take 40 mg by mouth once a day in the morning of chemo on Wednesdays- or unless otherwise instructed 40 tablet 2   fluticasone (FLONASE) 50 MCG/ACT nasal spray Place 1 spray into both nostrils daily as needed  for allergies or rhinitis.     furosemide (LASIX) 20 MG tablet Take 3 tablets (60 mg total) by mouth daily. If your weight increases by more than 2 pounds compared to the day prior or 5 pounds over the past week take an additional dose that day. 90 tablet 2   gabapentin (NEURONTIN) 300 MG capsule TAKE TWO CAPSULES BY MOUTH TWICE A DAY (Patient taking differently: Take 600 mg by mouth 2 (two) times daily.) 360 capsule 1   hydrALAZINE (APRESOLINE) 50 MG tablet Take 50 mg by mouth 3 (three) times daily.     lisinopril (ZESTRIL)  40 MG tablet TAKE ONE TABLET BY MOUTH ONE TIME DAILY (Patient taking differently: Take 40 mg by mouth daily.) 90 tablet 3   metolazone (ZAROXOLYN) 2.5 MG tablet Take 2.5 mg by mouth every other day.     nystatin (MYCOSTATIN/NYSTOP) powder Apply topically 2 (two) times daily for 13 days. Apply under both arms. 30 g 0   pomalidomide (POMALYST) 2 MG capsule Take 1 capsule (2 mg total) by mouth daily. Take one tablet (2 mg by mouth) daily for 21 days, then take 7 days off [employer] auth #[id number]  Date obtained 09/17/22 21 capsule 0   potassium chloride SA (KLOR-CON M) 20 MEQ tablet Take 2 tablets (40 mEq total) by mouth daily. Take an extra tablet on the day you take the Metolazone (60 meq total) (Patient taking differently: Take 40 mEq by mouth 2 (two) times daily. 2 tabs AM and 1 PM) 180 tablet 3   sodium chloride (OCEAN) 0.65 % SOLN nasal spray Place 1 spray into both nostrils as needed for congestion.     Omega-3 Fatty Acids (FISH OIL PO) Take 1 Capful by mouth daily. (Patient not taking: Reported on 10/13/2022)     terbinafine (LAMISIL) 250 MG tablet Take 250 mg by mouth daily. (Patient not taking: Reported on 10/13/2022)     No current facility-administered medications for this visit.    REVIEW OF SYSTEMS:   Constitutional: ( - ) fevers, ( - )  chills , ( - ) night sweats Eyes: ( - ) blurriness of vision, ( - ) double vision, ( - ) watery eyes Ears, nose, mouth, throat, and  face: ( - ) mucositis, ( - ) sore throat Respiratory: ( - ) cough, ( - ) dyspnea, ( - ) wheezes Cardiovascular: ( - ) palpitation, ( - ) chest discomfort, ( - ) lower extremity swelling Gastrointestinal:  ( - ) nausea, ( - ) heartburn, ( - ) change in bowel habits Skin: ( - ) abnormal skin rashes Lymphatics: ( - ) new lymphadenopathy, ( - ) easy bruising Neurological: ( - ) numbness, ( - ) tingling, ( - ) new weaknesses Behavioral/Psych: ( - ) mood change, ( - ) new changes  All other systems were reviewed with the patient and are negative.  PHYSICAL EXAMINATION: ECOG PERFORMANCE STATUS: 2 - Symptomatic, <50% confined to bed  Vitals:   10/13/22 1305  BP: (!) 171/77  Pulse: (!) 58  Resp: 17  Temp: 98.1 F (36.7 C)  SpO2: 96%      Filed Weights   10/13/22 1305  Weight: 245 lb 6.4 oz (111.3 kg)       GENERAL: Well-appearing elderly Caucasian male, alert, no distress and comfortable SKIN: no overt abnormalities of the skin.  EYES: conjunctiva are pink and non-injected, sclera clear LUNGS: clear to auscultation and percussion with normal breathing effort HEART: tachycardic &  no murmurs. +bilateral lower extremity edema, left greater than right Musculoskeletal: no cyanosis of digits and no clubbing  PSYCH: alert & oriented x 3, fluent speech NEURO: no focal motor/sensory deficits  LABORATORY DATA:  I have reviewed the data as listed    Latest Ref Rng & Units 10/13/2022   12:19 PM 10/06/2022   10:24 AM 10/03/2022    4:04 AM  CBC  WBC 4.0 - 10.5 K/uL 10.8  5.8  4.1   Hemoglobin 13.0 - 17.0 g/dL 54.0  98.1  9.4   Hematocrit 39.0 - 52.0 % 34.6  33.1  29.6   Platelets  150 - 400 K/uL 255  395  230        Latest Ref Rng & Units 10/13/2022   12:19 PM 10/06/2022   10:24 AM 10/03/2022    4:04 AM  CMP  Glucose 70 - 99 mg/dL 161  096  045   BUN 8 - 23 mg/dL 14  12  15    Creatinine 0.61 - 1.24 mg/dL 4.09  8.11  9.14   Sodium 135 - 145 mmol/L 141  141  140   Potassium 3.5 - 5.1  mmol/L 3.8  4.2  3.7   Chloride 98 - 111 mmol/L 107  109  109   CO2 22 - 32 mmol/L 29  26  25    Calcium 8.9 - 10.3 mg/dL 8.7  8.2  7.6   Total Protein 6.5 - 8.1 g/dL 6.2  6.1  5.2   Total Bilirubin 0.3 - 1.2 mg/dL 0.3  0.3  0.6   Alkaline Phos 38 - 126 U/L 98  112  57   AST 15 - 41 U/L 14  17  23    ALT 0 - 44 U/L 18  24  32     Lab Results  Component Value Date   MPROTEIN Not Observed 10/06/2022   MPROTEIN Not Observed 09/01/2022   MPROTEIN Not Observed 08/04/2022   Lab Results  Component Value Date   KPAFRELGTCHN 49.0 (H) 10/06/2022   KPAFRELGTCHN 24.2 (H) 09/01/2022   KPAFRELGTCHN 16.1 08/04/2022   LAMBDASER 12.0 10/06/2022   LAMBDASER 5.8 09/01/2022   LAMBDASER 3.8 (L) 08/04/2022   KAPLAMBRATIO 4.08 (H) 10/06/2022   KAPLAMBRATIO 4.17 (H) 09/01/2022   KAPLAMBRATIO 4.24 (H) 08/04/2022     RADIOGRAPHIC STUDIES: DG Chest 1 View  Result Date: 10/03/2022 CLINICAL DATA:  72 year old male with history of pneumonia. EXAM: CHEST  1 VIEW COMPARISON:  Chest x-ray 09/28/2022. FINDINGS: Diffuse interstitial prominence, widespread peribronchial cuffing and patchy ill-defined opacities are noted throughout the lungs bilaterally, most confluent in the right upper lobe where the extent of airspace consolidation appears slightly increased compared to the recent prior study. No definite pleural effusions. No pneumothorax. No cephalization of the pulmonary vasculature. Heart size is normal. The patient is rotated to the left on today's exam, resulting in distortion of the mediastinal contours and reduced diagnostic sensitivity and specificity for mediastinal pathology. Orthopedic fixation hardware throughout the cervical spine. IMPRESSION: 1. Severe multilobar bilateral bronchopneumonia, most confluent in the right upper lobe, which appears progressive compared to the prior study, as above. Electronically Signed   By: Trudie Reed M.D.   On: 10/03/2022 08:38   CT CHEST W CONTRAST  Result Date:  09/30/2022 CLINICAL DATA:  Pneumonia with complications suspected and shortness of breath. Flu positive. EXAM: CT CHEST WITH CONTRAST TECHNIQUE: Multidetector CT imaging of the chest was performed during intravenous contrast administration. RADIATION DOSE REDUCTION: This exam was performed according to the departmental dose-optimization program which includes automated exposure control, adjustment of the mA and/or kV according to patient size and/or use of iterative reconstruction technique. CONTRAST:  75mL OMNIPAQUE IOHEXOL 300 MG/ML  SOLN COMPARISON:  Portable chest 09/28/2022, AP Lat chest 09/26/2022, CTA chest 06/29/2022, CTA chest 03/15/2022. FINDINGS: Cardiovascular: The heart is upper limit of normal in size, smaller than on the last CTA. No pericardial effusion is seen. Chronic prominence of the pulmonary trunk indicating arterial hypertension is again noted, measuring 5.6 cm. Superior pulmonary veins are slightly prominent but no more prominent than previously. There are no visible significant coronary calcifications. There  is minimal scattered aortic calcific plaque of the descending segment. The aorta and great vessels are otherwise clear. There is no aortic aneurysm, stenosis or dissection. Mediastinum/Nodes: There are mildly prominent subcarinal and bilateral hilar lymph nodes both up to 1.2 cm short axis, but the adenopathy has improved since 06/29/2022 and is less prominent. A few right paratracheal nodes of similar size are also smaller than previously as well as borderline prominent left-sided prevascular nodes. There is no new or worsening adenopathy, no bulky or encasing nodes. Axillary spaces are clear. The thyroid gland, the thoracic trachea, main bronchi and thoracic esophagus are unremarkable. Lungs/Pleura: There are small layering pleural effusions on the right greater than left, similar to the last CT. There are mild features of subpleural interstitial edema in the lung bases but this is not  seen elsewhere. There is interval increase patchy consolidation in the left lower lobe, most dense alongside the effusion. Additional patchy airspace disease noted in the right greater than left upper lobes but less dense than previously, with patchy ground-glass and tree-in-bud interstitial changes in the base of the right upper lobe and in the right lower lobe, with minimal consolidation in the posterior right middle lobe. Findings favor multifocal pneumonia. An aspiration component is not excluded. Upper Abdomen: No acute findings. The liver is mildly steatotic. A thin walled homogeneous cyst, Hounsfield density of -1, is again noted alongside the pancreatic tail and may represent a pseudocyst, pancreatic cyst or mucinous neoplasm. This was first seen on the CTA chest from 03/15/2022. The last abdomen and pelvis dedicated CT was 05/14/2015 and this was not present at that time. MRI without and with contrast is recommended on a nonemergent basis to further characterize this entity. Musculoskeletal: History of myeloma. Expansile lesion again noted in the posterior right sixth rib and T11 spinous process. There is additional lytic lesion again in the left L1 and left L2 pedicles. A dorsal fusion construct with T2 corpectomy and interbody hardware is again also noted spanning T1-4 and seems unchanged. There is bridging enthesopathy multiple other thoracic levels. No new abnormality. IMPRESSION: 1. Multifocal airspace disease in the right greater than left lungs, with interval increase in consolidation in the left lower lobe. Findings favor multifocal pneumonia. An aspiration component is not excluded. 2. Small right greater than left pleural effusions, similar to the last CT. 3. Mild subpleural interstitial edema in the lung bases. Mild distention of the pulmonary veins. 4. Chronic prominence of the pulmonary trunk indicating arterial hypertension. 5. Aortic atherosclerosis.  Borderline cardiomegaly. 6. Mediastinal  and hilar adenopathy, but has improved from the last CTA. 7. History of myeloma with expansile lesions in the posterior right sixth rib, T11 spinous process, left L1 and L2 pedicles. No appreciable change from the last CT. 8. Thin walled 2.8 cm homogeneous cystic lesion alongside the pancreatic tail, Hounsfield density -1, Hounsfield density of -1, not present on the last CT abdomen and pelvis in 2017. Nonemergent MRI without and with contrast is recommended to further characterize this. 9. Hepatic steatosis. Aortic Atherosclerosis (ICD10-I70.0). Electronically Signed   By: Almira Bar M.D.   On: 09/30/2022 04:17   DG CHEST PORT 1 VIEW  Result Date: 09/28/2022 CLINICAL DATA:  Dyspnea. EXAM: PORTABLE CHEST 1 VIEW COMPARISON:  09/26/2022. FINDINGS: Unchanged hazy and consolidative opacities right upper and lower lobes, as well as the left lower lung, again suspicious for multifocal pneumonia. Stable cardiac and mediastinal contours. No pleural effusion or pneumothorax. IMPRESSION: Unchanged hazy and consolidative opacities right upper  and lower lobes, as well as the left lower lung, again suspicious for multifocal pneumonia. Electronically Signed   By: Orvan Falconer M.D.   On: 09/28/2022 14:53   DG Chest 2 View  Result Date: 09/26/2022 CLINICAL DATA:  Shortness of breath.  Productive cough and chills. EXAM: CHEST - 2 VIEW COMPARISON:  Chest radiograph dated July 01, 2022 FINDINGS: The heart is enlarged. Bilateral lower lobe hazy opacities concerning for pneumonia. Differential may also include pulmonary edema. Small bilateral pleural effusions. Advanced osteopenia and posterior spinal fusion at cervicothoracic junction. IMPRESSION: Bilateral lower lobe hazy opacities concerning for pneumonia. Differential may also include pulmonary edema. Small bilateral pleural effusions. Electronically Signed   By: Larose Hires D.O.   On: 09/26/2022 16:52    ASSESSMENT & PLAN ULYSIS NEWLEN is a 72 y.o. male  with medical history significant for IgA Kappa Multiple Myeloma who presents for a follow up visit.   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  He meets diagnostic criteria by having a serum free light chain ratio greater than 100 and lytic lesions causing pathological fracture of the humerus.  Given this I would recommend we pursued Darzalex, Revlimid, and dexamethasone.  Given his advanced age I would not recommend transplantation, that this is something we can consider when it comes time to transition to maintenance therapy versus transplant.  The patient voices understanding of the plan moving forward.  # IgA Kappa Multiple Myeloma --Metastatic survey completed 12/15/2021 --Patient underwent fixation of his humerus on 12/17/2021, pathology confirmed plasmacytoma/plasma cell neoplasm.  --Received 6 cycles of Darzalex, Revlimid, and dexamethasone started on 12/28/2021-05/18/2022.  --Due to worsening myeloma osseous lesions including pathologic fracture at T2 with cord compression, Dr. Leonides Schanz recommends changing therapy to Velcade/Pomalyst/Dex.  Plan: --Due for Cycle 4, Day 8 of Velcade/Pom/Dex today --labs from today showed white blood cell 10.8, Hgb 11.0, Plt 255. --restaging labs from 10/06/2022 with SPEP and SFLC showed M protein not detectable. Kappa increased to 49.0 (from 24.2), Lambda 12.0, Ratio 4.08 --Pomalyst was dose reduced to 2 mg p.o. due to cytopenias. Recommend to continue --RTC in 2 weeks with interval weekly treatments.   #Neck pain and mid back pain: --Suspect pain is exacerbated from recent hospitalization and being in bed/sitting for long periods.  --Due to history of lytic lesions with cord compression, we will obtain MRI C/T/L spine to rule out new or worsening disease.   #Palliative Radiation Therapy: # Dysphagia --Received palliative radiation from 01/06/2022-01/19/2022 to  right hymerus, right forearm and right chest/rib, 20 Gy in 10 Fx.  --recieved palliative radiation to surgical bed in the upper thoracic spine area on 07/13/2022.  27 Gy in 9 Fx.   # Rash Concerning for Shingles-resolved -- Prescribed valacyclovir 3 times daily 1000 mg x 10 days-completed the course -- We will continue to monitor rash closely.  #DVT: --Found to have acute DVT involving left peroneal veins. Likely secondary to recent surgery and hospitalization --Currently on eliquis 5 mg BID.    #Hypokalemia: --Currently on potassium chloride 60 mEq per day as prescribed but takes three times a day (60 mEq) on the days he takes Metolazine.  --Potassium level is 3.8 today. No further intervention needed.   #Rash on scalp and right forearm: --Okay to try topical hydrocortisone cream for spot treatment  #Supportive Care -- chemotherapy education complete -- port placement not required -- zofran 8mg  q8H PRN and  compazine 10mg  PO q6H for nausea -- acyclovir 400mg  PO BID for VCZ prophylaxis -- Started Zometa q 12 weeks on 05/18/2022. Next dose April 2024.    No orders of the defined types were placed in this encounter.   All questions were answered. The patient knows to call the clinic with any problems, questions or concerns.  A total of more than 30 minutes were spent on this encounter with face-to-face time and non-face-to-face time, including preparing to see the patient, ordering tests and/or medications, counseling the patient and coordination of care as outlined above.   Georga Kaufmann PA-C Dept of Hematology and Oncology [facility] [place] Phone: [phone]   10/13/2022 3:36 PM

## 2022-10-14 ENCOUNTER — Encounter: Payer: Self-pay | Admitting: Internal Medicine

## 2022-10-14 ENCOUNTER — Encounter: Payer: Self-pay | Admitting: Physician Assistant

## 2022-10-14 DIAGNOSIS — R29898 Other symptoms and signs involving the musculoskeletal system: Secondary | ICD-10-CM | POA: Diagnosis not present

## 2022-10-14 NOTE — Therapy (Signed)
OUTPATIENT PHYSICAL THERAPY ONCOLOGY TREATMENT  Patient Name: Travis [name] MRN: [id number] DOB:May 08, 1950, 72 y.o., male Today's Date: 10/15/2022  END OF SESSION:  PT End of Session - 10/15/22 1409     Visit Number 5    Number of Visits 16    Date for PT Re-Evaluation 11/04/22    PT Start Time 1300    PT Stop Time 1345    PT Time Calculation (min) 45 min    Activity Tolerance Patient tolerated treatment well    Behavior During Therapy [facility] for tasks assessed/performed             Past Medical History:  Diagnosis Date   A-fib (HCC)    Arthritis    "comes and goes; mostly in hands, occasionally elbow" (04/05/2017)   Complication of anesthesia    "just wanted to sleep alot  for couple days S/P VARICOCELE OR" (04/05/2017)   DVT (deep venous thrombosis) (HCC)    LLE   Dysrhythmia    PVCs    GERD (gastroesophageal reflux disease)    History of hiatal hernia    History of radiation therapy    Thoracic Spine- 07/13/22-07/23/22-Dr. Antony Blackbird   Hypertension    Impaired glucose tolerance    Multiple myeloma (HCC)    Obesity (BMI 30-39.9) 07/26/2016   OSA on CPAP 07/26/2016   Mild with AHI 12.5/hr now on CPAP at 14cm H2O   PAF (paroxysmal atrial fibrillation) (HCC)    s/p PVI Ablation in 2018 // Flecainide Rx // Echo 8/21: EF 60-65, no RWMA, mild LVH, normal RV SF, moderate RVE, mild BAE, trivial MR, mild dilation of aortic root (40 mm)   Pneumonia    "may have had walking pneumonia a few years ago" (04/05/2017)   Pre-diabetes    Thrombophlebitis    Past Surgical History:  Procedure Laterality Date   ATRIAL FIBRILLATION ABLATION  04/05/2017   ATRIAL FIBRILLATION ABLATION N/A 04/05/2017   Procedure: ATRIAL FIBRILLATION ABLATION;  Surgeon: [name], MD;  Location: [facility];  Service: Cardiovascular;  Laterality: N/A;   BONE BIOPSY Right 12/17/2021   Procedure: BONE BIOPSY;  Surgeon: [name], MD;  Location: [facility];  Service:  Orthopedics;  Laterality: Right;   COMPLETE RIGHT HIP REPLACMENT  01/19/2019   EVALUATION UNDER ANESTHESIA WITH HEMORRHOIDECTOMY N/A 02/24/2017   Procedure: EXAM UNDER ANESTHESIA WITH  HEMORRHOIDECTOMY;  Surgeon: [name], MD;  Location: [facility];  Service: General;  Laterality: N/A;   EXCISIONAL TOTAL HIP ARTHROPLASTY WITH ANTIBIOTIC SPACERS Right 09/25/2020   Procedure: EXCISIONAL TOTAL HIP ARTHROPLASTY WITH ANTIBIOTIC SPACERS;  Surgeon: [name], MD;  Location: [facility];  Service: Orthopedics;  Laterality: Right;   FLEXIBLE SIGMOIDOSCOPY N/A 04/15/2015   Procedure:  UNSEDATED FLEXIBLE SIGMOIDOSCOPY;  Surgeon: [name], MD;  Location: [facility];  Service: Endoscopy;  Laterality: N/A;   HUMERUS IM NAIL Right 12/17/2021   Procedure: INTRAMEDULLARY (IM) NAIL HUMERAL;  Surgeon: [name], MD;  Location: [facility];  Service: Orthopedics;  Laterality: Right;   KNEE ARTHROSCOPY Left ~ 2016   POSTERIOR CERVICAL FUSION/FORAMINOTOMY N/A 06/04/2022   Procedure: Thoracic one - Thoracic Four Posterior lateral arthrodesis/ fusion and Thoracic two Corpectomy;  Surgeon: [name], MD;  Location: [facility];  Service: Neurosurgery;  Laterality: N/A;   REIMPLANTATION OF TOTAL HIP Right 12/25/2020   Procedure: REIMPLANTATION/REVISION OF RIGHT TOTAL HIP VERSUS REPEAT IRRIGATION AND DEBRIDEMENT;  Surgeon: [name], MD;  Location: [facility];  Service:  Orthopedics;  Laterality: Right;   TESTICLE SURGERY  1988   VARICOCELE   TONSILLECTOMY     VARICOSE VEIN SURGERY Bilateral    Patient Active Problem List   Diagnosis Date Noted   Pancreatic cyst 09/30/2022   Infection due to novel influenza A virus 09/29/2022   Persistent atrial fibrillation (HCC) 07/15/2022   Acute on chronic diastolic heart failure (HCC) 07/03/2022   Pulmonary infiltrates 07/01/2022   Acute respiratory failure with hypoxia (HCC) 07/01/2022   Hyponatremia 06/29/2022   History of thoracic surgery 06/29/2022    Acute DVT of left tibial vein (HCC) 06/21/2022   Hypokalemia 06/21/2022   Vitamin D deficiency 05/30/2022   Other constipation 05/29/2022   Pathologic compression fracture of spine, initial encounter (HCC) 05/29/2022   Cord compression (HCC) 05/28/2022   Degenerative cervical disc 05/14/2022   Hypercoagulable state due to persistent atrial fibrillation (HCC) 01/11/2022   Multiple myeloma (HCC) 12/10/2021   Medication monitoring encounter 02/27/2021   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   Status post peripherally inserted central catheter (PICC) central line placement 11/06/2020   Drug rash with eosinophilia and systemic symptoms syndrome 11/06/2020   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   PAF (paroxysmal atrial fibrillation) (HCC)    GERD (gastroesophageal reflux disease) 03/29/2018   Hiatal hernia 03/29/2018   History of DVT (deep vein thrombosis) 03/29/2018   CAP (community acquired pneumonia) 03/29/2018   Osteoarthritis 03/29/2018   Neuropathy 08/29/2017   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   Paroxysmal atrial fibrillation with rapid ventricular response (HCC) 04/05/2017   Prolapsed internal hemorrhoids, grade 4, s/p ligation/pexy/hemorrhoidectomy x 2 02/24/2017 02/24/2017   OSA (obstructive sleep apnea) 07/26/2016   Obesity (BMI 30-39.9) 07/26/2016   Atrial fibrillation with RVR (HCC)    Essential hypertension    Chest pain at rest     PCP: Eleanora Neighbor, MD  REFERRING PROVIDER: Antony Blackbird, MD  REFERRING DIAG: Multiple Myeloma  THERAPY DIAG:  Compression fracture of thoracic vertebra, unspecified thoracic vertebral level, initial encounter (HCC)  Multiple myeloma not having achieved remission (HCC)  Muscle weakness (generalized)  Neck pain  Abnormal posture  Other lack of coordination  Chronic right shoulder pain  ONSET DATE: 2023  Rationale for Evaluation and Treatment: Rehabilitation  SUBJECTIVE:                                                                                                                                                                                            SUBJECTIVE STATEMENT: Tight and stiff across my upper back    PERTINENT HISTORY:  [name] 72 y.o. male with medical  history significant for IgA Kappa Multiple Myeloma. He was diagnosed with smouldering myeloma in 2018 . Last July he developed right arm pain due to a pathologic fx and he underwent radiation and fixation. In early Feb 2024 he had radiation for pathologic T2 compression fx followed by a T1-T4 fusion. PAIN:  Are you having pain? PAIN:  Are you having pain? Not right now NPRS scale: 0/10 Pain location: shoulder blades and neck, right thoracic paraspinals Pain orientation: Bilateral  PAIN TYPE: aching and tight Pain description: constant  Aggravating factors: getting up and down, looking down Relieving factors: sleeping, laying down.   PRECAUTIONS: Multiple Myeloma with mets to R ribs, R humerus with ORIF for pathological fracture 12/17/21. Lesions have also been found in R rib and also in his skull. Pathologic fx T2 with fusion T1-4 on 06/04/22, Bilateral LE edema (on Lasix), Afib   WEIGHT BEARING RESTRICTIONS: No  FALLS:  Has patient fallen in last 6 months? Yes. Number of falls 1  LIVING ENVIRONMENT: Lives with: lives with their family and lives with their spouse Lives in: House/apartment Stairs: Yes; Internal: 14 steps; on left going up and External: 3 steps; on right going up Has following equipment at home: Single point cane and Walker - 4 wheeled  OCCUPATION: attorney/ mostly retired?  LEISURE: golf (can't do now.), use to play racquetball  HAND DOMINANCE: right   PRIOR LEVEL OF FUNCTION: Independent  POSTURE:significant slump sitting posture with forward head  PATIENT GOALS: decrease pain,get stronger,improve balance   OBJECTIVE:  COGNITION: Overall cognitive status: Within  functional limits for tasks assessed   PALPATION: Very tender bilateral UT/levator, scapular regions  OBSERVATIONS / OTHER ASSESSMENTS: very flexed head and neck with chin nearly at chest  SENSATION: Light touch: Appears intact    POSTURE: significant forward head, round shoulders, increased kyphosis   Gait holding single point cane in left hand but not using. Right shoulder low, flexed neck;difficulty raising head to see where he is going  UPPER EXTREMITY AROM/PROM:  A/PROM RIGHT   eval   Shoulder extension   Shoulder flexion 91 with compensation  Shoulder abduction 67 with compensation  Shoulder internal rotation   Shoulder external rotation C6    (Blank rows = not tested)  A/PROM LEFT   eval  Shoulder extension   Shoulder flexion 123  Shoulder abduction 112  Shoulder internal rotation T7  Shoulder external rotation t3    (Blank rows = not tested)  CERVICAL AROM: All within normal limits:    Percent limited  Flexion Fully flexed  Extension From flexed position decreased 80%  Right lateral flexion   Left lateral flexion   Right rotation Dec 50% sitting in FH posture  Left rotation        Dec 50%    UPPER EXTREMITY STRENGTH:    LOWER EXTREMITY AROM/PROM:  A/PROM Right eval  Hip flexion   Hip extension   Hip abduction   Hip adduction   Hip internal rotation   Hip external rotation   Knee flexion   Knee extension   Ankle dorsiflexion   Ankle plantarflexion   Ankle inversion   Ankle eversion    (Blank rows = not tested)  A/PROM LEFT eval  Hip flexion   Hip extension   Hip abduction   Hip adduction   Hip internal rotation   Hip external rotation   Knee flexion   Knee extension   Ankle dorsiflexion   Ankle plantarflexion   Ankle inversion   Ankle  eversion    (Blank rows = not tested)  LOWER EXTREMITY MMT:   LYMPHEDEMA ASSESSMENTS:   SURGERY TYPE/DATE: 8/17/23Left prox humerus fixation for pathologic fx  2/2/2024Fusion T1-T4 for  pathologic fx with cord compression 06/04/22 posterior cervical fusion/foraminotomy;T1-4 fusion NUMBER OF LYMPH NODES REMOVED: NA  CHEMOTHERAPY: Velcade/promalyst/Dex;  RADIATION:to prox humerus and thoracic spine pathologic fx  HORMONE TREATMENT: NA  INFECTIONS: NA  FUNCTIONAL TESTS:    GAIT: Distance walked: 20 Assistive device utilized: carried SPC in left Level of assistance: Complete Independence Comments: feels unsteady, very flexed neck posture   QUICK DASH SURVEY:    TODAY'S TREATMENT:                                                                                                                                         DATE:   10/15/22:Pt arrives for aquatic physical therapy. Treatment took place in 3.5-5.5 feet of water. Water temperature was 90 degrees F. Pt entered the pool via stairs, step to step slowly with heavy use of rails.Pt requires buoyancy of water for support and to offload joints with strengthening exercises.   Seated water bench with 75% submersion Pt performed seated LE AROM exercises 20x in all planes, while discussing current status/pain/symptoms/and how we would use the water principles to help address his complaints. 75% depth water walking with large noodle for postural support. VC to lift chin for one length/relax muscles walking back. 6x in each direction. Pt frequently unsteady. Static stance unassisted: mitten hand arm presses frwd/bk 10x. Pt unsteady. Vc to lift chin. Single buoy UE weights for horizontal abd/add 10x. VC to lift chin. Horizontal decompression float with 100% flotation and PTA providing lateral sway to mobilize trunk and soft tissue work along the scar.  10/12/22 NuStep Level 5 seat 12/UE 7 x 7 mins; SpO2 97% after After Neuro exs supine on mat table for following: Meeks Decompression Exs x 5, 5 sec holds returning therapist demo Supine serratus punch 1x 10 each Incline stretch in bars 3 x 10 sec Standing heel raises with light  finger touch x 10, standing march x 10 light HH Step onto ax pad x 10 ea side, Sides tep x 5 ea Ended session with STM to bil UT/upper scap region and cervical muscles and bilateral paraspinal muscles in prop sitting Ipdated HEP with Meeks decompression  10/07/22: Therapeutic Exercises NuStep Level 5 seat 12/UE 7 x 7 mins; SpO2 97% after After Neuro exs supine on mat table for following: Meeks Decompression Exs x 5, 5 sec holds returning therapist demo Supine serratus punch 2 x 10 each Green theraband for bil UE er and horz abd x10 each.  NeuroMuscular Re Ed In // bars: Heel-toe front and retro 4x each; slow, controlled high knee marching 4x each; Seated rest and SpO2 95%; then crossover in front to Rt and Lt 2x each; toe walking 4x with VC's to work  on increasing ROM as much as able; then SLS with toe tap front, side and back 5x each side returning therapist demo for all and VC's for erect posture throughout Manual Therapy Ended session with STM to bil UT/upper scap region and cervical muscles with gentle cervical P/ROM  PATIENT EDUCATION:  Education details: sitting posture with lumbar roll Person educated: Patient Education method: Medical illustrator Education comprehension: verbalized understanding and returned demonstration  HOME EXERCISE PROGRAM: Doing HEP from HHPT; cervical ROM, shoulder rolls, shrugs, scap retraction, UT stretches  ASSESSMENT:  CLINICAL IMPRESSION: Pt arrives for first aquatic treatment. Pt was mostly unsteady from this being his first time walking against the waters resistance. Anticipate this will improve with practice. No pain complaints. Pt reported great tension relief getting horizontal in the water after upright exercising.    OBJECTIVE IMPAIRMENTS: decreased activity tolerance, decreased balance, difficulty walking, decreased ROM, decreased strength, increased edema, impaired UE functional use, postural dysfunction, and pain.   ACTIVITY  LIMITATIONS: carrying, lifting, dressing, reach over head, and locomotion level  PARTICIPATION LIMITATIONS: cleaning, laundry, driving, and occupation  PERSONAL FACTORS:  Multiple Myeloma  are also affecting patient's functional outcome.   REHAB POTENTIAL: Good  CLINICAL DECISION MAKING: Evolving/moderate complexity  EVALUATION COMPLEXITY: Moderate  GOALS: Goals reviewed with patient? Yes  SHORT TERM GOALS: Target date: 10/21/22  Pt will be educated in and independent in proper sitting posture using lumbar roll prn Baseline: Goal status: INITIAL  2.  Pt will report decreased muscle soreness in bilateral upper back x 25% Baseline:  Goal status: INITIAL  3.  Pt will be compliant with a HEP for postural strengthening/ROM Baseline:  Goal status: INITIAL   LONG TERM GOALS: Target date: 11/04/22  Pt will be able to bring his head to neutral position for safety with ambulation/driving Baseline:  Goal status: INITIAL  2.  Pt will have decreased upper back pain by 50% or more Baseline:  Goal status: INITIAL  3.  Pt will be able to flex right shoulder to 115 degrees with minimal compensation for improved reaching ability Baseline:  Goal status: INITIAL 4; Pt will be able to maintain tandem stance on both sides x 10 sec to demonstrated improved balance. Goal status:NEW   PT FREQUENCY: 2x/week  PT DURATION: 6 weeks  PLANNED INTERVENTIONS: Therapeutic exercises, Therapeutic activity, Neuromuscular re-education, Balance training, Gait training, Patient/Family education, Self Care, Joint mobilization, Aquatic Therapy, Manual therapy, and Re-evaluation  PLAN FOR NEXT SESSION: See how first aquatic session went.  Ane Payment, PTA 10/15/22 2:09 PM

## 2022-10-15 ENCOUNTER — Ambulatory Visit: Payer: Medicare Other | Admitting: Physical Therapy

## 2022-10-15 ENCOUNTER — Telehealth: Payer: Self-pay | Admitting: Internal Medicine

## 2022-10-15 ENCOUNTER — Encounter: Payer: Self-pay | Admitting: Physical Therapy

## 2022-10-15 DIAGNOSIS — R278 Other lack of coordination: Secondary | ICD-10-CM | POA: Diagnosis not present

## 2022-10-15 DIAGNOSIS — S22000A Wedge compression fracture of unspecified thoracic vertebra, initial encounter for closed fracture: Secondary | ICD-10-CM

## 2022-10-15 DIAGNOSIS — R293 Abnormal posture: Secondary | ICD-10-CM

## 2022-10-15 DIAGNOSIS — G8929 Other chronic pain: Secondary | ICD-10-CM | POA: Diagnosis not present

## 2022-10-15 DIAGNOSIS — M542 Cervicalgia: Secondary | ICD-10-CM

## 2022-10-15 DIAGNOSIS — C9000 Multiple myeloma not having achieved remission: Secondary | ICD-10-CM | POA: Diagnosis not present

## 2022-10-15 DIAGNOSIS — M6281 Muscle weakness (generalized): Secondary | ICD-10-CM

## 2022-10-15 DIAGNOSIS — M25511 Pain in right shoulder: Secondary | ICD-10-CM | POA: Diagnosis not present

## 2022-10-15 NOTE — Telephone Encounter (Signed)
Sent him a message to increase hydralazine to 75 mg 3x per day TOld him to send msg next week with how his BP is

## 2022-10-16 ENCOUNTER — Other Ambulatory Visit: Payer: Self-pay

## 2022-10-16 MED ORDER — HYDRALAZINE HCL 50 MG PO TABS: 75.0000 mg | ORAL_TABLET | Freq: Three times a day (TID) | ORAL | 3 refills | Status: DC

## 2022-10-18 ENCOUNTER — Ambulatory Visit: Payer: Medicare Other

## 2022-10-18 DIAGNOSIS — R293 Abnormal posture: Secondary | ICD-10-CM | POA: Diagnosis not present

## 2022-10-18 DIAGNOSIS — M542 Cervicalgia: Secondary | ICD-10-CM

## 2022-10-18 DIAGNOSIS — C9000 Multiple myeloma not having achieved remission: Secondary | ICD-10-CM | POA: Diagnosis not present

## 2022-10-18 DIAGNOSIS — M6281 Muscle weakness (generalized): Secondary | ICD-10-CM | POA: Diagnosis not present

## 2022-10-18 DIAGNOSIS — S22000A Wedge compression fracture of unspecified thoracic vertebra, initial encounter for closed fracture: Secondary | ICD-10-CM | POA: Diagnosis not present

## 2022-10-18 DIAGNOSIS — M25511 Pain in right shoulder: Secondary | ICD-10-CM | POA: Diagnosis not present

## 2022-10-18 DIAGNOSIS — R278 Other lack of coordination: Secondary | ICD-10-CM | POA: Diagnosis not present

## 2022-10-18 DIAGNOSIS — G8929 Other chronic pain: Secondary | ICD-10-CM | POA: Diagnosis not present

## 2022-10-18 NOTE — Therapy (Signed)
OUTPATIENT PHYSICAL THERAPY ONCOLOGY TREATMENT  Patient Name: Travis [name] MRN: [id number] DOB:11/06/1950, 72 y.o., male Today's Date: 10/18/2022  END OF SESSION:  PT End of Session - 10/18/22 1102     Visit Number 6    Number of Visits 16    Date for PT Re-Evaluation 11/04/22    PT Start Time 1103    PT Stop Time 1153    PT Time Calculation (min) 50 min    Activity Tolerance Patient tolerated treatment well    Behavior During Therapy WFL for tasks assessed/performed             Past Medical History:  Diagnosis Date   A-fib (HCC)    Arthritis    "comes and goes; mostly in hands, occasionally elbow" (04/05/2017)   Complication of anesthesia    "just wanted to sleep alot  for couple days S/P VARICOCELE OR" (04/05/2017)   DVT (deep venous thrombosis) (HCC)    LLE   Dysrhythmia    PVCs    GERD (gastroesophageal reflux disease)    History of hiatal hernia    History of radiation therapy    Thoracic Spine- 07/13/22-07/23/22-Dr. Antony Blackbird   Hypertension    Impaired glucose tolerance    Multiple myeloma (HCC)    Obesity (BMI 30-39.9) 07/26/2016   OSA on CPAP 07/26/2016   Mild with AHI 12.5/hr now on CPAP at 14cm H2O   PAF (paroxysmal atrial fibrillation) (HCC)    s/p PVI Ablation in 2018 // Flecainide Rx // Echo 8/21: EF 60-65, no RWMA, mild LVH, normal RV SF, moderate RVE, mild BAE, trivial MR, mild dilation of aortic root (40 mm)   Pneumonia    "may have had walking pneumonia a few years ago" (04/05/2017)   Pre-diabetes    Thrombophlebitis    Past Surgical History:  Procedure Laterality Date   ATRIAL FIBRILLATION ABLATION  04/05/2017   ATRIAL FIBRILLATION ABLATION N/A 04/05/2017   Procedure: ATRIAL FIBRILLATION ABLATION;  Surgeon: [name], MD;  Location: [facility];  Service: Cardiovascular;  Laterality: N/A;   BONE BIOPSY Right 12/17/2021   Procedure: BONE BIOPSY;  Surgeon: [name], MD;  Location: [facility];  Service:  Orthopedics;  Laterality: Right;   COMPLETE RIGHT HIP REPLACMENT  01/19/2019   EVALUATION UNDER ANESTHESIA WITH HEMORRHOIDECTOMY N/A 02/24/2017   Procedure: EXAM UNDER ANESTHESIA WITH  HEMORRHOIDECTOMY;  Surgeon: [name], MD;  Location: [facility];  Service: General;  Laterality: N/A;   EXCISIONAL TOTAL HIP ARTHROPLASTY WITH ANTIBIOTIC SPACERS Right 09/25/2020   Procedure: EXCISIONAL TOTAL HIP ARTHROPLASTY WITH ANTIBIOTIC SPACERS;  Surgeon: [name], MD;  Location: [facility];  Service: Orthopedics;  Laterality: Right;   FLEXIBLE SIGMOIDOSCOPY N/A 04/15/2015   Procedure:  UNSEDATED FLEXIBLE SIGMOIDOSCOPY;  Surgeon: [name], MD;  Location: [facility];  Service: Endoscopy;  Laterality: N/A;   HUMERUS IM NAIL Right 12/17/2021   Procedure: INTRAMEDULLARY (IM) NAIL HUMERAL;  Surgeon: [name], MD;  Location: [facility];  Service: Orthopedics;  Laterality: Right;   KNEE ARTHROSCOPY Left ~ 2016   POSTERIOR CERVICAL FUSION/FORAMINOTOMY N/A 06/04/2022   Procedure: Thoracic one - Thoracic Four Posterior lateral arthrodesis/ fusion and Thoracic two Corpectomy;  Surgeon: [name], MD;  Location: [facility];  Service: Neurosurgery;  Laterality: N/A;   REIMPLANTATION OF TOTAL HIP Right 12/25/2020   Procedure: REIMPLANTATION/REVISION OF RIGHT TOTAL HIP VERSUS REPEAT IRRIGATION AND DEBRIDEMENT;  Surgeon: [name], MD;  Location: [facility];  Service:  Orthopedics;  Laterality: Right;   TESTICLE SURGERY  1988   VARICOCELE   TONSILLECTOMY     VARICOSE VEIN SURGERY Bilateral    Patient Active Problem List   Diagnosis Date Noted   Pancreatic cyst 09/30/2022   Infection due to novel influenza A virus 09/29/2022   Persistent atrial fibrillation (HCC) 07/15/2022   Acute on chronic diastolic heart failure (HCC) 07/03/2022   Pulmonary infiltrates 07/01/2022   Acute respiratory failure with hypoxia (HCC) 07/01/2022   Hyponatremia 06/29/2022   History of thoracic surgery 06/29/2022    Acute DVT of left tibial vein (HCC) 06/21/2022   Hypokalemia 06/21/2022   Vitamin D deficiency 05/30/2022   Other constipation 05/29/2022   Pathologic compression fracture of spine, initial encounter (HCC) 05/29/2022   Cord compression (HCC) 05/28/2022   Degenerative cervical disc 05/14/2022   Hypercoagulable state due to persistent atrial fibrillation (HCC) 01/11/2022   Multiple myeloma (HCC) 12/10/2021   Medication monitoring encounter 02/27/2021   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   Status post peripherally inserted central catheter (PICC) central line placement 11/06/2020   Drug rash with eosinophilia and systemic symptoms syndrome 11/06/2020   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   PAF (paroxysmal atrial fibrillation) (HCC)    GERD (gastroesophageal reflux disease) 03/29/2018   Hiatal hernia 03/29/2018   History of DVT (deep vein thrombosis) 03/29/2018   CAP (community acquired pneumonia) 03/29/2018   Osteoarthritis 03/29/2018   Neuropathy 08/29/2017   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   Paroxysmal atrial fibrillation with rapid ventricular response (HCC) 04/05/2017   Prolapsed internal hemorrhoids, grade 4, s/p ligation/pexy/hemorrhoidectomy x 2 02/24/2017 02/24/2017   OSA (obstructive sleep apnea) 07/26/2016   Obesity (BMI 30-39.9) 07/26/2016   Atrial fibrillation with RVR (HCC)    Essential hypertension    Chest pain at rest     PCP: Eleanora Neighbor, MD  REFERRING PROVIDER: Antony Blackbird, MD  REFERRING DIAG: Multiple Myeloma  THERAPY DIAG:  Compression fracture of thoracic vertebra, unspecified thoracic vertebral level, initial encounter (HCC)  Multiple myeloma not having achieved remission (HCC)  Muscle weakness (generalized)  Abnormal posture  Neck pain  ONSET DATE: 2023  Rationale for Evaluation and Treatment: Rehabilitation  SUBJECTIVE:                                                                                                                                                                                            SUBJECTIVE STATEMENT:  Sore at my upper back, both lateral trunk and right upper arm are sore. Really sore all those spots after the pool. The manual after last visit gave me some  lasting relief until the pool.  Saw the neuro surgeon and he thought I was on target for the surgery I had. I have a spine MRI on Saturday.    PERTINENT HISTORY:  KYLIL CHIARI 72 y.o. male with medical history significant for IgA Kappa Multiple Myeloma. He was diagnosed with smouldering myeloma in 2018 . Last July he developed right arm pain due to a pathologic fx and he underwent radiation and fixation. In early Feb 2024 he had radiation for pathologic T2 compression fx followed by a T1-T4 fusion. PAIN:  Are you having pain? PAIN:  Are you having pain? Not right now NPRS scale: 3-4/10 Pain location: shoulder blades and neck, bilateral thoracic paraspinals Pain orientation: Bilateral  PAIN TYPE: aching and tight Pain description: constant  Aggravating factors: getting up and down, looking down Relieving factors: sleeping, laying down.   PRECAUTIONS: Multiple Myeloma with mets to R ribs, R humerus with ORIF for pathological fracture 12/17/21. Lesions have also been found in R rib and also in his skull. Pathologic fx T2 with fusion T1-4 on 06/04/22, Bilateral LE edema (on Lasix), Afib   WEIGHT BEARING RESTRICTIONS: No  FALLS:  Has patient fallen in last 6 months? Yes. Number of falls 1  LIVING ENVIRONMENT: Lives with: lives with their family and lives with their spouse Lives in: House/apartment Stairs: Yes; Internal: 14 steps; on left going up and External: 3 steps; on right going up Has following equipment at home: Single point cane and Walker - 4 wheeled  OCCUPATION: attorney/ mostly retired?  LEISURE: golf (can't do now.), use to play racquetball  HAND DOMINANCE: right   PRIOR LEVEL OF FUNCTION:  Independent  POSTURE:significant slump sitting posture with forward head  PATIENT GOALS: decrease pain,get stronger,improve balance   OBJECTIVE:  COGNITION: Overall cognitive status: Within functional limits for tasks assessed   PALPATION: Very tender bilateral UT/levator, scapular regions  OBSERVATIONS / OTHER ASSESSMENTS: very flexed head and neck with chin nearly at chest  SENSATION: Light touch: Appears intact    POSTURE: significant forward head, round shoulders, increased kyphosis   Gait holding single point cane in left hand but not using. Right shoulder low, flexed neck;difficulty raising head to see where he is going  UPPER EXTREMITY AROM/PROM:  A/PROM RIGHT   eval   Shoulder extension   Shoulder flexion 91 with compensation  Shoulder abduction 67 with compensation  Shoulder internal rotation   Shoulder external rotation C6    (Blank rows = not tested)  A/PROM LEFT   eval  Shoulder extension   Shoulder flexion 123  Shoulder abduction 112  Shoulder internal rotation T7  Shoulder external rotation t3    (Blank rows = not tested)  CERVICAL AROM: All within normal limits:    Percent limited  Flexion Fully flexed  Extension From flexed position decreased 80%  Right lateral flexion   Left lateral flexion   Right rotation Dec 50% sitting in FH posture  Left rotation        Dec 50%    UPPER EXTREMITY STRENGTH:    LOWER EXTREMITY AROM/PROM:  A/PROM Right eval  Hip flexion   Hip extension   Hip abduction   Hip adduction   Hip internal rotation   Hip external rotation   Knee flexion   Knee extension   Ankle dorsiflexion   Ankle plantarflexion   Ankle inversion   Ankle eversion    (Blank rows = not tested)  A/PROM LEFT eval  Hip flexion  Hip extension   Hip abduction   Hip adduction   Hip internal rotation   Hip external rotation   Knee flexion   Knee extension   Ankle dorsiflexion   Ankle plantarflexion   Ankle inversion    Ankle eversion    (Blank rows = not tested)  LOWER EXTREMITY MMT:   LYMPHEDEMA ASSESSMENTS:   SURGERY TYPE/DATE: 8/17/23Left prox humerus fixation for pathologic fx  2/2/2024Fusion T1-T4 for pathologic fx with cord compression 06/04/22 posterior cervical fusion/foraminotomy;T1-4 fusion NUMBER OF LYMPH NODES REMOVED: NA  CHEMOTHERAPY: Velcade/promalyst/Dex;  RADIATION:to prox humerus and thoracic spine pathologic fx  HORMONE TREATMENT: NA  INFECTIONS: NA  FUNCTIONAL TESTS:    GAIT: Distance walked: 20 Assistive device utilized: carried SPC in left Level of assistance: Complete Independence Comments: feels unsteady, very flexed neck posture   QUICK DASH SURVEY:    TODAY'S TREATMENT:                                                                                                                                         DATE:   10/18/2022 NuStep Level 5 seat 12/UE 7 x 8 mins; 627 steps In parallel bars: Standing heel raises x 12 light hand touch, marching light hand touch 2 x 10 Sidestepping 3 lengths and return intermittent light touch Sit to stand 2 x 5 with 1 hand A Step ups on ax x 10 ea forward and sideways B Vector reaches 2 x 5  B Ambulation on beam forward 6 lengths and sideways 6 length;no HH to light HH STM to bil UT/upper scap region and cervical muscles and bilateral paraspinal muscles in prop sitting with cocoa butter VC required throughout for proper posture/head position   10/15/22:Pt arrives for aquatic physical therapy. Treatment took place in 3.5-5.5 feet of water. Water temperature was 90 degrees F. Pt entered the pool via stairs, step to step slowly with heavy use of rails.Pt requires buoyancy of water for support and to offload joints with strengthening exercises.   Seated water bench with 75% submersion Pt performed seated LE AROM exercises 20x in all planes, while discussing current status/pain/symptoms/and how we would use the water principles to  help address his complaints. 75% depth water walking with large noodle for postural support. VC to lift chin for one length/relax muscles walking back. 6x in each direction. Pt frequently unsteady. Static stance unassisted: mitten hand arm presses frwd/bk 10x. Pt unsteady. Vc to lift chin. Single buoy UE weights for horizontal abd/add 10x. VC to lift chin. Horizontal decompression float with 100% flotation and PTA providing lateral sway to mobilize trunk and soft tissue work along the scar.  10/12/22 NuStep Level 5 seat 12/UE 7 x 7 mins; SpO2 97% after After Neuro exs supine on mat table for following: Meeks Decompression Exs x 5, 5 sec holds returning therapist demo Supine serratus punch 1x 10 each Incline stretch in bars 3  x 10 sec Standing heel raises with light finger touch x 10, standing march x 10 light HH Step onto ax pad x 10 ea side, Sides tep x 5 ea Ended session with STM to bil UT/upper scap region and cervical muscles and bilateral paraspinal muscles in prop sitting Ipdated HEP with Meeks decompression  10/07/22: Therapeutic Exercises NuStep Level 5 seat 12/UE 7 x 7 mins; SpO2 97% after After Neuro exs supine on mat table for following: Meeks Decompression Exs x 5, 5 sec holds returning therapist demo Supine serratus punch 2 x 10 each Green theraband for bil UE er and horz abd x10 each.  NeuroMuscular Re Ed In // bars: Heel-toe front and retro 4x each; slow, controlled high knee marching 4x each; Seated rest and SpO2 95%; then crossover in front to Rt and Lt 2x each; toe walking 4x with VC's to work on increasing ROM as much as able; then SLS with toe tap front, side and back 5x each side returning therapist demo for all and VC's for erect posture throughout Manual Therapy Ended session with STM to bil UT/upper scap region and cervical muscles with gentle cervical P/ROM  PATIENT EDUCATION:  Education details: sitting posture with lumbar roll Person educated: Patient Education  method: Medical illustrator Education comprehension: verbalized understanding and returned demonstration  HOME EXERCISE PROGRAM: Doing HEP from HHPT; cervical ROM, shoulder rolls, shrugs, scap retraction, UT stretches  ASSESSMENT:  CLINICAL IMPRESSION:  Pt continues to be challenged by balance activities, and legs still feeling weak. He is compliant with HEP and works hard in therapy. He requires 1 UE assist with sit to stand. Constant cueing required for posture  OBJECTIVE IMPAIRMENTS: decreased activity tolerance, decreased balance, difficulty walking, decreased ROM, decreased strength, increased edema, impaired UE functional use, postural dysfunction, and pain.   ACTIVITY LIMITATIONS: carrying, lifting, dressing, reach over head, and locomotion level  PARTICIPATION LIMITATIONS: cleaning, laundry, driving, and occupation  PERSONAL FACTORS:  Multiple Myeloma  are also affecting patient's functional outcome.   REHAB POTENTIAL: Good  CLINICAL DECISION MAKING: Evolving/moderate complexity  EVALUATION COMPLEXITY: Moderate  GOALS: Goals reviewed with patient? Yes  SHORT TERM GOALS: Target date: 10/21/22  Pt will be educated in and independent in proper sitting posture using lumbar roll prn Baseline: Goal status: INITIAL  2.  Pt will report decreased muscle soreness in bilateral upper back x 25% Baseline:  Goal status: INITIAL  3.  Pt will be compliant with a HEP for postural strengthening/ROM Baseline:  Goal status: INITIAL   LONG TERM GOALS: Target date: 11/04/22  Pt will be able to bring his head to neutral position for safety with ambulation/driving Baseline:  Goal status: INITIAL  2.  Pt will have decreased upper back pain by 50% or more Baseline:  Goal status: INITIAL  3.  Pt will be able to flex right shoulder to 115 degrees with minimal compensation for improved reaching ability Baseline:  Goal status: INITIAL 4; Pt will be able to maintain tandem  stance on both sides x 10 sec to demonstrated improved balance. Goal status:NEW   PT FREQUENCY: 2x/week  PT DURATION: 6 weeks  PLANNED INTERVENTIONS: Therapeutic exercises, Therapeutic activity, Neuromuscular re-education, Balance training, Gait training, Patient/Family education, Self Care, Joint mobilization, Aquatic Therapy, Manual therapy, and Re-evaluation  PLAN FOR NEXT SESSION: See how first aquatic session went.  Ane Payment, PTA 10/18/22 12:02 PM

## 2022-10-19 ENCOUNTER — Telehealth: Payer: Self-pay | Admitting: Nurse Practitioner

## 2022-10-19 DIAGNOSIS — I10 Essential (primary) hypertension: Secondary | ICD-10-CM | POA: Diagnosis not present

## 2022-10-19 DIAGNOSIS — E7800 Pure hypercholesterolemia, unspecified: Secondary | ICD-10-CM | POA: Diagnosis not present

## 2022-10-19 DIAGNOSIS — I4891 Unspecified atrial fibrillation: Secondary | ICD-10-CM | POA: Diagnosis not present

## 2022-10-19 DIAGNOSIS — M1611 Unilateral primary osteoarthritis, right hip: Secondary | ICD-10-CM | POA: Diagnosis not present

## 2022-10-19 DIAGNOSIS — I5032 Chronic diastolic (congestive) heart failure: Secondary | ICD-10-CM | POA: Diagnosis not present

## 2022-10-19 NOTE — Telephone Encounter (Signed)
Travis [name] anything you want me to do now or wait and address at office visit tomorrow?

## 2022-10-19 NOTE — Telephone Encounter (Signed)
Angela from Dr. Donnamarie Poag office called to inform that the patient was seen today and their blood pressure remains elevated at 150/90. According to Dr. Donnamarie Poag instructions, he is requested to discuss medication changes with the patient during their visit tomorrow.

## 2022-10-19 NOTE — Progress Notes (Unsigned)
Office Visit    Patient Name: Travis [name] Date of Encounter: 10/19/2022  Primary Care Provider:  [name], MD Primary Cardiologist:  [name], MD Primary Electrophysiologist: Lanier Prude, MD   Past Medical History    Past Medical History:  Diagnosis Date   A-fib [facility])    Arthritis    "comes and goes; mostly in hands, occasionally elbow" (04/05/2017)   Complication of anesthesia    "just wanted to sleep alot  for couple days S/P VARICOCELE OR" (04/05/2017)   DVT (deep venous thrombosis) (HCC)    LLE   Dysrhythmia    PVCs    GERD (gastroesophageal reflux disease)    History of hiatal hernia    History of radiation therapy    Thoracic Spine- 07/13/22-07/23/22-Dr. Antony Blackbird   Hypertension    Impaired glucose tolerance    Multiple myeloma (HCC)    Obesity (BMI 30-39.9) 07/26/2016   OSA on CPAP 07/26/2016   Mild with AHI 12.5/hr now on CPAP at 14cm H2O   PAF (paroxysmal atrial fibrillation) (HCC)    s/p PVI Ablation in 2018 // Flecainide Rx // Echo 8/21: EF 60-65, no RWMA, mild LVH, normal RV SF, moderate RVE, mild BAE, trivial MR, mild dilation of aortic root (40 mm)   Pneumonia    "may have had walking pneumonia a few years ago" (04/05/2017)   Pre-diabetes    Thrombophlebitis    Past Surgical History:  Procedure Laterality Date   ATRIAL FIBRILLATION ABLATION  04/05/2017   ATRIAL FIBRILLATION ABLATION N/A 04/05/2017   Procedure: ATRIAL FIBRILLATION ABLATION;  Surgeon: [name], MD;  Location: [facility];  Service: Cardiovascular;  Laterality: N/A;   BONE BIOPSY Right 12/17/2021   Procedure: BONE BIOPSY;  Surgeon: [name], MD;  Location: [facility];  Service: Orthopedics;  Laterality: Right;   COMPLETE RIGHT HIP REPLACMENT  01/19/2019   EVALUATION UNDER ANESTHESIA WITH HEMORRHOIDECTOMY N/A 02/24/2017   Procedure: EXAM UNDER ANESTHESIA WITH  HEMORRHOIDECTOMY;  Surgeon: [name], MD;  Location: [facility];   Service: General;  Laterality: N/A;   EXCISIONAL TOTAL HIP ARTHROPLASTY WITH ANTIBIOTIC SPACERS Right 09/25/2020   Procedure: EXCISIONAL TOTAL HIP ARTHROPLASTY WITH ANTIBIOTIC SPACERS;  Surgeon: [name], MD;  Location: [facility];  Service: Orthopedics;  Laterality: Right;   FLEXIBLE SIGMOIDOSCOPY N/A 04/15/2015   Procedure:  UNSEDATED FLEXIBLE SIGMOIDOSCOPY;  Surgeon: [name], MD;  Location: [facility];  Service: Endoscopy;  Laterality: N/A;   HUMERUS IM NAIL Right 12/17/2021   Procedure: INTRAMEDULLARY (IM) NAIL HUMERAL;  Surgeon: [name], MD;  Location: [facility];  Service: Orthopedics;  Laterality: Right;   KNEE ARTHROSCOPY Left ~ 2016   POSTERIOR CERVICAL FUSION/FORAMINOTOMY N/A 06/04/2022   Procedure: Thoracic one - Thoracic Four Posterior lateral arthrodesis/ fusion and Thoracic two Corpectomy;  Surgeon: [name], MD;  Location: [facility];  Service: Neurosurgery;  Laterality: N/A;   REIMPLANTATION OF TOTAL HIP Right 12/25/2020   Procedure: REIMPLANTATION/REVISION OF RIGHT TOTAL HIP VERSUS REPEAT IRRIGATION AND DEBRIDEMENT;  Surgeon: [name], MD;  Location: [facility];  Service: Orthopedics;  Laterality: Right;   TESTICLE SURGERY  1988   VARICOCELE   TONSILLECTOMY     VARICOSE VEIN SURGERY Bilateral     Allergies  Allergies  Allergen Reactions   Linezolid Other (See Comments)    Burning tongue    Vancomycin Rash    Other Reaction(s): rash, itching, fever, chills  History of Present Illness    Travis [name]  is a 72 year old male with a PMH of PAF (on Eliquis) s/p PVI ablation in 2018, HTN, OSA, prior DVT,monoclonal gammopathy (smoldering myeloma), peripheral neuropathy who presents today for follow-up of hypertension.  Travis [name] has been followed by Dr. [name] for many years for management of  Hypertension and atrial fibrillation.    He completed a Myoview 02/2016 that was normal with no evidence of ischemia.He was diagnosed with atrial  fibrillation in 2017 and underwent PVI by Dr. [name] in 2018.  He also completed cardiac CTA that showed no evidence of calcium.  He had a cardiogram completed in 2019 that showed EF of 60 to 65% with no RWMA, diastolic function with aortic root measuring 40 mm.  He was treated with Darzalex, Revlimid, and dexamethasone 12/28/21 for multiple myeloma.  He was seen in AF clinic on 9/thousand 23 for management of AF and plan for outpatient cardioversion however patient spontaneously converted.  He developed AF with RVR in 03/2022 while in [place] and required diuresis.  He was seen on 04/2022 for follow-up and was back in atrial flutter with no resolution with metoprolol.  He was started on amiodarone and metoprolol was cut back.  He was referred to Dr. Lalla Brothers to discuss treatment options and was seen on 05/12/2022.  Amiodarone was reduced to 200 mg daily and blood pressure was noted to be above goal and hydralazine was increased to 37.5 3 times daily.  He was seen in the ED 06/2022 with AF with RVR after suffering a fall.  2D echo was completed showing EF of 60 to 65% with grade 2 DD patient was in sinus rhythm at that time.  Worsening dyspnea after being discharged and was brought back to the emergency room via EMS.  CT scan was completed showing no evidence of PE but did reveal bilateral pneumonia with small bilateral pleural effusion.  He was also found to be in AF with RVR on arrival with acute CHF exacerbation.  He was loaded on amiodarone and diuresed.  He was seen in AF clinic 07/15/2022 with plan to pursue DCCV if sinus rhythm not maintained with amiodarone.  He was seen by Dr. [name] on 07/29/2022 and was maintaining sinus rhythm therefore DCCV was canceled.  He was last seen by Dr. [name] on 08/10/2022 for evaluation of lower extremity edema.  He reported poor response to Lasix which was increased to 80 daily.  He was maintained on Eliquis and amiodarone.  He had addition of metolazone 2.5 mg to help with  diuresis.  He contacted the office/16/24 for complaint of elevated systolic blood pressure.  Hydralazine was increased to 50/25/50 daily and then switch to 75 mg 3 times daily.  Travis [name] presents today for complaint of elevated blood pressures.  Since last being seen in the office patient reports he has been experiencing elevated blood pressures with systolics reading in the 160s to 170s.  He also notes some shortness of breath and is slightly volume up on examination today.  He is compliant with his current medications and denies any adverse reactions.  He denies any new supplements or sodium indiscretions in his diet.  He does note some discomfort since recovering from thoracic spinal surgery.  His blood pressure today is 174/82 and recheck of blood pressure was not completed after patient's exam.  He denies any headaches or vision changes with elevated blood pressures.  He denies any headaches or  visual changes with elevated blood pressures.  Patient denies chest pain, palpitations, dyspnea, PND, orthopnea, nausea, vomiting, dizziness, syncope, weight gain, or early satiety.  Home Medications    Current Outpatient Medications  Medication Sig Dispense Refill   acyclovir (ZOVIRAX) 400 MG tablet Take 1 tablet (400 mg total) by mouth 2 (two) times daily. 180 tablet 3   albuterol (VENTOLIN HFA) 108 (90 Base) MCG/ACT inhaler Inhale 2 puffs into the lungs every 6 (six) hours as needed for wheezing or shortness of breath. 8 g 0   amiodarone (PACERONE) 200 MG tablet Take 1 tablet (200 mg total) by mouth daily. Start after finishing 40 mg twice daily dose of  days (Patient taking differently: Take 200 mg by mouth daily.) 180 tablet 1   apixaban (ELIQUIS) 5 MG TABS tablet Take 1 tablet (5 mg total) by mouth 2 (two) times daily. 60 tablet 11   cholecalciferol (VITAMIN D3) 25 MCG (1000 UNIT) tablet Take 1,000 Units by mouth daily.     Cyanocobalamin (VITAMIN B-12 PO) Take 1,000 mcg by mouth daily.      dexamethasone (DECADRON) 4 MG tablet Take 10 tablets (40 mg total) by mouth every 7 (seven) days. Take 40 mg by mouth once a day in the morning of chemo on Wednesdays- or unless otherwise instructed 40 tablet 2   fluticasone (FLONASE) 50 MCG/ACT nasal spray Place 1 spray into both nostrils daily as needed for allergies or rhinitis.     furosemide (LASIX) 20 MG tablet Take 3 tablets (60 mg total) by mouth daily. If your weight increases by more than 2 pounds compared to the day prior or 5 pounds over the past week take an additional dose that day. 90 tablet 2   gabapentin (NEURONTIN) 300 MG capsule TAKE TWO CAPSULES BY MOUTH TWICE A DAY (Patient taking differently: Take 600 mg by mouth 2 (two) times daily.) 360 capsule 1   hydrALAZINE (APRESOLINE) 50 MG tablet Take 1.5 tablets (75 mg total) by mouth 3 (three) times daily. 135 tablet 3   lisinopril (ZESTRIL) 40 MG tablet TAKE ONE TABLET BY MOUTH ONE TIME DAILY (Patient taking differently: Take 40 mg by mouth daily.) 90 tablet 3   metolazone (ZAROXOLYN) 2.5 MG tablet Take 2.5 mg by mouth every other day.     Omega-3 Fatty Acids (FISH OIL PO) Take 1 Capful by mouth daily. (Patient not taking: Reported on 10/13/2022)     pomalidomide (POMALYST) 2 MG capsule Take 1 capsule (2 mg total) by mouth daily. Take one tablet (2 mg by mouth) daily for 21 days, then take 7 days off [employer] auth #[id number]  Date obtained 09/17/22 21 capsule 0   potassium chloride SA (KLOR-CON M) 20 MEQ tablet Take 2 tablets (40 mEq total) by mouth daily. Take an extra tablet on the day you take the Metolazone (60 meq total) (Patient taking differently: Take 40 mEq by mouth 2 (two) times daily. 2 tabs AM and 1 PM) 180 tablet 3   sodium chloride (OCEAN) 0.65 % SOLN nasal spray Place 1 spray into both nostrils as needed for congestion.     No current facility-administered medications for this visit.     Review of Systems  Please see the history of present illness.    (+) Back pain, lower  extremity edema (+) Shortness of breath with exertion  All other systems reviewed and are otherwise negative except as noted above.  Physical Exam    Wt Readings from Last 3 Encounters:  10/13/22 245  lb 6.4 oz (111.3 kg)  10/06/22 237 lb (107.5 kg)  10/03/22 241 lb 13.5 oz (109.7 kg)   ZO:XWRUE were no vitals filed for this visit.,There is no height or weight on file to calculate BMI.  Constitutional:      Appearance: Healthy appearance. Not in distress.  Neck:     Vascular: JVD normal.  Pulmonary:     Effort: Pulmonary effort is normal.     Breath sounds: No wheezing. No rales. Diminished in the bases Cardiovascular:     Normal rate. Regular rhythm. Normal S1. Normal S2.      Murmurs: There is no murmur.  Edema:    +2 bilateral lower extremity edema Abdominal:     Palpations: Abdomen is soft non tender. There is no hepatomegaly.  Skin:    General: Skin is warm and dry.  Neurological:     General: No focal deficit present.     Mental Status: Alert and oriented to person, place and time.     Cranial Nerves: Cranial nerves are intact.  EKG/LABS/ Recent Cardiac Studies    ECG personally reviewed by me today -none completed today  Cardiac Studies & Procedures       ECHOCARDIOGRAM  ECHOCARDIOGRAM COMPLETE 09/08/2022  Narrative ECHOCARDIOGRAM REPORT    Patient Name:   XUAN MENOR [facility] Date of Exam: 09/08/2022 Medical Rec #:  [id number]        Height:       70.0 in Accession #:    [id number]       Weight:       248.0 lb Date of Birth:  19-Mar-1951       BSA:          2.287 m Patient Age:    71 years         BP:           161/72 mmHg Patient Gender: M                HR:           56 bpm. Exam Location:  Outpatient  Procedure: 2D Echo, Cardiac Doppler and Color Doppler  Indications:    chemo  History:        Patient has prior history of Echocardiogram examinations. Arrythmias:Atrial Fibrillation; Risk Factors:Hypertension.  Sonographer:    Mike Gip Referring  Phys: [id number] IRENE T THAYIL  IMPRESSIONS   1. Left ventricular ejection fraction, by estimation, is 60 to 65%. The left ventricle has normal function. The left ventricle has no regional wall motion abnormalities. There is severe concentric left ventricular hypertrophy. Left ventricular diastolic parameters are indeterminate. 2. Right ventricular systolic function is normal. The right ventricular size is mildly enlarged. There is normal pulmonary artery systolic pressure. 3. Left atrial size was severely dilated. 4. Right atrial size was mildly dilated. 5. The mitral valve is normal in structure. No evidence of mitral valve regurgitation. No evidence of mitral stenosis. 6. The aortic valve is normal in structure. Aortic valve regurgitation is not visualized. No aortic stenosis is present. 7. There is mild dilatation of the ascending aorta, measuring 37 mm. 8. The inferior vena cava is dilated in size with <50% respiratory variability, suggesting right atrial pressure of 15 mmHg.  FINDINGS Left Ventricle: Left ventricular ejection fraction, by estimation, is 60 to 65%. The left ventricle has normal function. The left ventricle has no regional wall motion abnormalities. The global longitudinal strain is normal despite suboptimal segment tracking. The left ventricular internal cavity  size was normal in size. There is severe concentric left ventricular hypertrophy. Left ventricular diastolic parameters are indeterminate.  Right Ventricle: The right ventricular size is mildly enlarged. No increase in right ventricular wall thickness. Right ventricular systolic function is normal. There is normal pulmonary artery systolic pressure. The tricuspid regurgitant velocity is 2.12 m/s, and with an assumed right atrial pressure of 8 mmHg, the estimated right ventricular systolic pressure is 26.0 mmHg.  Left Atrium: Left atrial size was severely dilated.  Right Atrium: Right atrial size was mildly  dilated.  Pericardium: There is no evidence of pericardial effusion. Presence of epicardial fat layer.  Mitral Valve: The mitral valve is normal in structure. No evidence of mitral valve regurgitation. No evidence of mitral valve stenosis.  Tricuspid Valve: The tricuspid valve is normal in structure. Tricuspid valve regurgitation is trivial. No evidence of tricuspid stenosis.  Aortic Valve: The aortic valve is normal in structure. Aortic valve regurgitation is not visualized. No aortic stenosis is present.  Pulmonic Valve: The pulmonic valve was not well visualized. Pulmonic valve regurgitation is trivial. No evidence of pulmonic stenosis.  Aorta: There is mild dilatation of the ascending aorta, measuring 37 mm.  Venous: The inferior vena cava is dilated in size with less than 50% respiratory variability, suggesting right atrial pressure of 15 mmHg.  IAS/Shunts: No atrial level shunt detected by color flow Doppler.   LEFT VENTRICLE PLAX 2D LVIDd:         4.70 cm      Diastology LVIDs:         3.00 cm      LV e' medial:    6.64 cm/s LV PW:         1.60 cm      LV E/e' medial:  13.8 LV IVS:        1.50 cm      LV e' lateral:   12.10 cm/s LVOT diam:     2.00 cm      LV E/e' lateral: 7.6 LV SV:         84 LV SV Index:   37 LVOT Area:     3.14 cm  LV Volumes (MOD) LV vol d, MOD A2C: 177.0 ml LV vol d, MOD A4C: 130.0 ml LV vol s, MOD A2C: 63.8 ml LV vol s, MOD A4C: 46.7 ml LV SV MOD A2C:     113.2 ml LV SV MOD A4C:     130.0 ml LV SV MOD BP:      100.1 ml  RIGHT VENTRICLE             IVC RV Basal diam:  4.90 cm     IVC diam: 2.60 cm RV S prime:     19.50 cm/s TAPSE (M-mode): 2.8 cm  LEFT ATRIUM              Index        RIGHT ATRIUM           Index LA diam:        4.50 cm  1.97 cm/m   RA Area:     24.50 cm LA Vol (A2C):   114.0 ml 49.84 ml/m  RA Volume:   77.90 ml  34.06 ml/m LA Vol (A4C):   94.7 ml  41.41 ml/m LA Biplane Vol: 118.0 ml 51.59 ml/m AORTIC VALVE              PULMONIC VALVE LVOT Vmax:   131.00 cm/s PR End Diast  Vel: 4.24 msec LVOT Vmean:  77.200 cm/s LVOT VTI:    0.266 m  AORTA Ao Root diam: 3.50 cm Ao Asc diam:  3.70 cm  MITRAL VALVE               TRICUSPID VALVE MV Area (PHT): 3.98 cm    TR Peak grad:   18.0 mmHg MV Decel Time: 191 msec    TR Vmax:        212.00 cm/s MV E velocity: 91.95 cm/s MV A velocity: 40.90 cm/s  SHUNTS MV E/A ratio:  2.25        Systemic VTI:  0.27 m Systemic Diam: 2.00 cm  Kardie Tobb DO Electronically signed by Thomasene Ripple DO Signature Date/Time: 09/08/2022/1:55:03 PM    Final    MONITORS  LONG TERM MONITOR (3-14 DAYS) 03/04/2021  Narrative Patch Wear Time:  2 days and 13 hours (2022-10-20T21:57:49-0400 to 2022-10-23T11:09:21-0400)  Predominant rhythm is sinus rhythm   Rates 46 to 126 bpm   Average HR 53.   Rare PACs, Rae PVCs   SHort bursts SVT   Longest 7 beats,  Fastest at 4 beats for 126 bpm Triggered events correlated with some PVCs           Risk Assessment/Calculations:    CHA2DS2-VASc Score = 3   This indicates a 3.2% annual risk of stroke. The patient's score is based upon: CHF History: 1 HTN History: 1 Diabetes History: 0 Stroke History: 0 Vascular Disease History: 0 Age Score: 1 Gender Score: 0           Lab Results  Component Value Date   WBC 10.8 (H) 10/13/2022   HGB 11.0 (L) 10/13/2022   HCT 34.6 (L) 10/13/2022   MCV 97.7 10/13/2022   PLT 255 10/13/2022   Lab Results  Component Value Date   CREATININE 0.64 10/13/2022   BUN 14 10/13/2022   NA 141 10/13/2022   K 3.8 10/13/2022   CL 107 10/13/2022   CO2 29 10/13/2022   Lab Results  Component Value Date   ALT 18 10/13/2022   AST 14 (L) 10/13/2022   ALKPHOS 98 10/13/2022   BILITOT 0.3 10/13/2022   No results found for: "CHOL", "HDL", "LDLCALC", "LDLDIRECT", "TRIG", "CHOLHDL"  Lab Results  Component Value Date   HGBA1C 5.5 06/02/2022     Assessment & Plan    1.  Essential hypertension: -Patient  has experienced elevated BP and recently had hydralazine increased to 75 mg 3 times daily. -Patient's blood pressure today was elevated at 174/82 -We will discontinue lisinopril 40 mg today and start irbesartan 150 mg daily -Patient will check blood pressures over the next week and contact office with readings. -Ambulatory referral to Pharm.D. for BP titration  2.  Paroxysmal atrial fibrillation: -Patient currently treated with rate control with amiodarone 200 mg daily -Patient's creatinine was 0.64 and hemoglobin was 11 -Continue Eliquis 5 mg twice daily -CHA2DS2-VASc Score = 3 [CHF History: 1, HTN History: 1, Diabetes History: 0, Stroke History: 0, Vascular Disease History: 0, Age Score: 1, Gender Score: 0].  Therefore, the patient's annual risk of stroke is 3.2 %.      3.  HFpEF: -2D echo with EF of 60 to 65% with mild LVH with grade 2 DD -Patient is volume up on examination today and reports increased shortness of breath. -Patient was directed to take as needed dose of Lasix 60 mg daily along with Zaroxolyn 2.5 mg daily for 3 days. -We will monitor renal  function with a BMET that is drawn biweekly by oncology -Low sodium diet, fluid restriction <2L, and daily weights encouraged. Educated to contact our office for weight gain of 2 lbs overnight or 5 lbs in one week.   4.  Smoldering multiple myeloma: -Patient currently followed by oncology/hematology  5.  Obstructive sleep apnea: -Patient reports compliance with CPAP nightly  Disposition: Follow-up with [name], MD as scheduled    Medication Adjustments/Labs and Tests Ordered: Current medicines are reviewed at length with the patient today.  Concerns regarding medicines are outlined above.   Signed, Napoleon Form, Leodis Rains, NP 10/19/2022, 9:36 PM [facility]

## 2022-10-19 NOTE — Therapy (Signed)
OUTPATIENT PHYSICAL THERAPY ONCOLOGY TREATMENT  Patient Name: Travis [name] MRN: [id number] DOB:1950/10/14, 72 y.o., male Today's Date: 10/20/2022  END OF SESSION:  PT End of Session - 10/20/22 1016     Visit Number 7    Number of Visits 16    Date for PT Re-Evaluation 11/04/22    PT Start Time 0928    PT Stop Time 1007    PT Time Calculation (min) 39 min    Activity Tolerance Patient tolerated treatment well    Behavior During Therapy [facility] for tasks assessed/performed             Past Medical History:  Diagnosis Date   A-fib (HCC)    Arthritis    "comes and goes; mostly in hands, occasionally elbow" (04/05/2017)   Complication of anesthesia    "just wanted to sleep alot  for couple days S/P VARICOCELE OR" (04/05/2017)   DVT (deep venous thrombosis) (HCC)    LLE   Dysrhythmia    PVCs    GERD (gastroesophageal reflux disease)    History of hiatal hernia    History of radiation therapy    Thoracic Spine- 07/13/22-07/23/22-Dr. Antony Blackbird   Hypertension    Impaired glucose tolerance    Multiple myeloma (HCC)    Obesity (BMI 30-39.9) 07/26/2016   OSA on CPAP 07/26/2016   Mild with AHI 12.5/hr now on CPAP at 14cm H2O   PAF (paroxysmal atrial fibrillation) (HCC)    s/p PVI Ablation in 2018 // Flecainide Rx // Echo 8/21: EF 60-65, no RWMA, mild LVH, normal RV SF, moderate RVE, mild BAE, trivial MR, mild dilation of aortic root (40 mm)   Pneumonia    "may have had walking pneumonia a few years ago" (04/05/2017)   Pre-diabetes    Thrombophlebitis    Past Surgical History:  Procedure Laterality Date   ATRIAL FIBRILLATION ABLATION  04/05/2017   ATRIAL FIBRILLATION ABLATION N/A 04/05/2017   Procedure: ATRIAL FIBRILLATION ABLATION;  Surgeon: [name], MD;  Location: [facility];  Service: Cardiovascular;  Laterality: N/A;   BONE BIOPSY Right 12/17/2021   Procedure: BONE BIOPSY;  Surgeon: [name], MD;  Location: [facility];  Service:  Orthopedics;  Laterality: Right;   COMPLETE RIGHT HIP REPLACMENT  01/19/2019   EVALUATION UNDER ANESTHESIA WITH HEMORRHOIDECTOMY N/A 02/24/2017   Procedure: EXAM UNDER ANESTHESIA WITH  HEMORRHOIDECTOMY;  Surgeon: [name], MD;  Location: [facility];  Service: General;  Laterality: N/A;   EXCISIONAL TOTAL HIP ARTHROPLASTY WITH ANTIBIOTIC SPACERS Right 09/25/2020   Procedure: EXCISIONAL TOTAL HIP ARTHROPLASTY WITH ANTIBIOTIC SPACERS;  Surgeon: [name], MD;  Location: [facility];  Service: Orthopedics;  Laterality: Right;   FLEXIBLE SIGMOIDOSCOPY N/A 04/15/2015   Procedure:  UNSEDATED FLEXIBLE SIGMOIDOSCOPY;  Surgeon: [name], MD;  Location: [facility];  Service: Endoscopy;  Laterality: N/A;   HUMERUS IM NAIL Right 12/17/2021   Procedure: INTRAMEDULLARY (IM) NAIL HUMERAL;  Surgeon: [name], MD;  Location: [facility];  Service: Orthopedics;  Laterality: Right;   KNEE ARTHROSCOPY Left ~ 2016   POSTERIOR CERVICAL FUSION/FORAMINOTOMY N/A 06/04/2022   Procedure: Thoracic one - Thoracic Four Posterior lateral arthrodesis/ fusion and Thoracic two Corpectomy;  Surgeon: [name], MD;  Location: [facility];  Service: Neurosurgery;  Laterality: N/A;   REIMPLANTATION OF TOTAL HIP Right 12/25/2020   Procedure: REIMPLANTATION/REVISION OF RIGHT TOTAL HIP VERSUS REPEAT IRRIGATION AND DEBRIDEMENT;  Surgeon: [name], MD;  Location: [facility];  Service:  Orthopedics;  Laterality: Right;   TESTICLE SURGERY  1988   VARICOCELE   TONSILLECTOMY     VARICOSE VEIN SURGERY Bilateral    Patient Active Problem List   Diagnosis Date Noted   Pancreatic cyst 09/30/2022   Infection due to novel influenza A virus 09/29/2022   Persistent atrial fibrillation (HCC) 07/15/2022   Acute on chronic diastolic heart failure (HCC) 07/03/2022   Pulmonary infiltrates 07/01/2022   Acute respiratory failure with hypoxia (HCC) 07/01/2022   Hyponatremia 06/29/2022   History of thoracic surgery 06/29/2022    Acute DVT of left tibial vein (HCC) 06/21/2022   Hypokalemia 06/21/2022   Vitamin D deficiency 05/30/2022   Other constipation 05/29/2022   Pathologic compression fracture of spine, initial encounter (HCC) 05/29/2022   Cord compression (HCC) 05/28/2022   Degenerative cervical disc 05/14/2022   Hypercoagulable state due to persistent atrial fibrillation (HCC) 01/11/2022   Multiple myeloma (HCC) 12/10/2021   Medication monitoring encounter 02/27/2021   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   Status post peripherally inserted central catheter (PICC) central line placement 11/06/2020   Drug rash with eosinophilia and systemic symptoms syndrome 11/06/2020   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   PAF (paroxysmal atrial fibrillation) (HCC)    GERD (gastroesophageal reflux disease) 03/29/2018   Hiatal hernia 03/29/2018   History of DVT (deep vein thrombosis) 03/29/2018   CAP (community acquired pneumonia) 03/29/2018   Osteoarthritis 03/29/2018   Neuropathy 08/29/2017   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   Paroxysmal atrial fibrillation with rapid ventricular response (HCC) 04/05/2017   Prolapsed internal hemorrhoids, grade 4, s/p ligation/pexy/hemorrhoidectomy x 2 02/24/2017 02/24/2017   OSA (obstructive sleep apnea) 07/26/2016   Obesity (BMI 30-39.9) 07/26/2016   Atrial fibrillation with RVR (HCC)    Essential hypertension    Chest pain at rest     PCP: Eleanora Neighbor, MD  REFERRING PROVIDER: Antony Blackbird, MD  REFERRING DIAG: Multiple Myeloma  THERAPY DIAG:  Compression fracture of thoracic vertebra, unspecified thoracic vertebral level, initial encounter (HCC)  Multiple myeloma not having achieved remission (HCC)  Abnormal posture  Muscle weakness (generalized)  Neck pain  Other lack of coordination  Chronic right shoulder pain  ONSET DATE: 2023  Rationale for Evaluation and Treatment: Rehabilitation  SUBJECTIVE:                                                                                                                                                                                            SUBJECTIVE STATEMENT: Not bad right now, pain like a 1-2/10. I was surprised how sore I was after the first water session. [name]  PERTINENT HISTORY:  Travis [name] 72 y.o. male with medical history significant for IgA Kappa Multiple Myeloma. He was diagnosed with smouldering myeloma in 2018 . Last July he developed right arm pain due to a pathologic fx and he underwent radiation and fixation. In early Feb 2024 he had radiation for pathologic T2 compression fx followed by a T1-T4 fusion. PAIN:  Are you having pain? PAIN:  Are you having pain? Not right now NPRS scale: 1-2/10 Pain location: shoulder blades and neck, bilateral thoracic paraspinals Pain orientation: Bilateral  PAIN TYPE: aching and tight Pain description: constant  Aggravating factors: getting up and down, looking down Relieving factors: sleeping, laying down.   PRECAUTIONS: Multiple Myeloma with mets to R ribs, R humerus with ORIF for pathological fracture 12/17/21. Lesions have also been found in R rib and also in his skull. Pathologic fx T2 with fusion T1-4 on 06/04/22, Bilateral LE edema (on Lasix), Afib   WEIGHT BEARING RESTRICTIONS: No  FALLS:  Has patient fallen in last 6 months? Yes. Number of falls 1  LIVING ENVIRONMENT: Lives with: lives with their family and lives with their spouse Lives in: House/apartment Stairs: Yes; Internal: 14 steps; on left going up and External: 3 steps; on right going up Has following equipment at home: Single point cane and Walker - 4 wheeled  OCCUPATION: attorney/ mostly retired?  LEISURE: golf (can't do now.), use to play racquetball  HAND DOMINANCE: right   PRIOR LEVEL OF FUNCTION: Independent  POSTURE:significant slump sitting posture with forward head  PATIENT GOALS: decrease pain,get stronger,improve  balance   OBJECTIVE:  COGNITION: Overall cognitive status: Within functional limits for tasks assessed   PALPATION: Very tender bilateral UT/levator, scapular regions  OBSERVATIONS / OTHER ASSESSMENTS: very flexed head and neck with chin nearly at chest  SENSATION: Light touch: Appears intact    POSTURE: significant forward head, round shoulders, increased kyphosis   Gait holding single point cane in left hand but not using. Right shoulder low, flexed neck;difficulty raising head to see where he is going  UPPER EXTREMITY AROM/PROM:  A/PROM RIGHT   eval   Shoulder extension   Shoulder flexion 91 with compensation  Shoulder abduction 67 with compensation  Shoulder internal rotation   Shoulder external rotation C6    (Blank rows = not tested)  A/PROM LEFT   eval  Shoulder extension   Shoulder flexion 123  Shoulder abduction 112  Shoulder internal rotation T7  Shoulder external rotation t3    (Blank rows = not tested)  CERVICAL AROM: All within normal limits:    Percent limited  Flexion Fully flexed  Extension From flexed position decreased 80%  Right lateral flexion   Left lateral flexion   Right rotation Dec 50% sitting in FH posture  Left rotation        Dec 50%    UPPER EXTREMITY STRENGTH:    LOWER EXTREMITY AROM/PROM:  A/PROM Right eval  Hip flexion   Hip extension   Hip abduction   Hip adduction   Hip internal rotation   Hip external rotation   Knee flexion   Knee extension   Ankle dorsiflexion   Ankle plantarflexion   Ankle inversion   Ankle eversion    (Blank rows = not tested)  A/PROM LEFT eval  Hip flexion   Hip extension   Hip abduction   Hip adduction   Hip internal rotation   Hip external rotation   Knee flexion   Knee extension   Ankle dorsiflexion  Ankle plantarflexion   Ankle inversion   Ankle eversion    (Blank rows = not tested)  LOWER EXTREMITY MMT:   LYMPHEDEMA ASSESSMENTS:   SURGERY TYPE/DATE:  8/17/23Left prox humerus fixation for pathologic fx  2/2/2024Fusion T1-T4 for pathologic fx with cord compression 06/04/22 posterior cervical fusion/foraminotomy;T1-4 fusion NUMBER OF LYMPH NODES REMOVED: NA  CHEMOTHERAPY: Velcade/promalyst/Dex;  RADIATION:to prox humerus and thoracic spine pathologic fx  HORMONE TREATMENT: NA  INFECTIONS: NA  FUNCTIONAL TESTS:    GAIT: Distance walked: 20 Assistive device utilized: carried SPC in left Level of assistance: Complete Independence Comments: feels unsteady, very flexed neck posture   QUICK DASH SURVEY:    TODAY'S TREATMENT:                                                                                                                                         DATE:   10/20/22:Pt arrives for aquatic physical therapy. Treatment took place in 3.5-5.5 feet of water. Water temperature was 90 degrees F. Pt entered the pool via stairs, step to step slowly with heavy use of rails.Pt requires buoyancy of water for support and to offload joints with strengthening exercises.   Seated water bench with 75% submersion Pt performed seated LE AROM exercises 20x in all planes, while discussing current status/pain/symptoms/and how we should modify todays treatment. 75% depth water walking with bar bell for postural support. VC to lift chin for one length/relax muscles walking back. 6x in each direction. Pt demonstrates less unsteadiness today. Static stance unassisted: mitten hand arm presses frwd/bk 10x. Pt unsteady. Vc to lift chin. Single buoy UE weights for horizontal abd/add 10x. VC to lift chin. Horizontal decompression float with 100% flotation and PTA providing lateral sway to mobilize trunk and soft tissue work along the scar.   10/18/2022 NuStep Level 5 seat 12/UE 7 x 8 mins; 627 steps In parallel bars: Standing heel raises x 12 light hand touch, marching light hand touch 2 x 10 Sidestepping 3 lengths and return intermittent light touch Sit to  stand 2 x 5 with 1 hand A Step ups on ax x 10 ea forward and sideways B Vector reaches 2 x 5  B Ambulation on beam forward 6 lengths and sideways 6 length;no HH to light HH STM to bil UT/upper scap region and cervical muscles and bilateral paraspinal muscles in prop sitting with cocoa butter VC required throughout for proper posture/head position   10/15/22:Pt arrives for aquatic physical therapy. Treatment took place in 3.5-5.5 feet of water. Water temperature was 90 degrees F. Pt entered the pool via stairs, step to step slowly with heavy use of rails.Pt requires buoyancy of water for support and to offload joints with strengthening exercises.   Seated water bench with 75% submersion Pt performed seated LE AROM exercises 20x in all planes, while discussing current status/pain/symptoms/and how we would use the water principles to help address his  complaints. 75% depth water walking with large noodle for postural support. VC to lift chin for one length/relax muscles walking back. 6x in each direction. Pt frequently unsteady. Static stance unassisted: mitten hand arm presses frwd/bk 10x. Pt unsteady. Vc to lift chin. Single buoy UE weights for horizontal abd/add 10x. VC to lift chin. Horizontal decompression float with 100% flotation and PTA providing lateral sway to mobilize trunk and soft tissue work along the scar.  PATIENT EDUCATION:  Education details: sitting posture with lumbar roll Person educated: Patient Education method: Medical illustrator Education comprehension: verbalized understanding and returned demonstration  HOME EXERCISE PROGRAM: Doing HEP from HHPT; cervical ROM, shoulder rolls, shrugs, scap retraction, UT stretches  ASSESSMENT:  CLINICAL IMPRESSION: Pt arrives for second aquatic therapy session with little pain and about the same amount of stiffness along his upper back. Pt was very sore after his first aquatic PT treatment so we kept exercises about the same  today in hopes of less soreness post. Remarked improvement in mobility of his skin along the upper back and traps.  OBJECTIVE IMPAIRMENTS: decreased activity tolerance, decreased balance, difficulty walking, decreased ROM, decreased strength, increased edema, impaired UE functional use, postural dysfunction, and pain.   ACTIVITY LIMITATIONS: carrying, lifting, dressing, reach over head, and locomotion level  PARTICIPATION LIMITATIONS: cleaning, laundry, driving, and occupation  PERSONAL FACTORS:  Multiple Myeloma  are also affecting patient's functional outcome.   REHAB POTENTIAL: Good  CLINICAL DECISION MAKING: Evolving/moderate complexity  EVALUATION COMPLEXITY: Moderate  GOALS: Goals reviewed with patient? Yes  SHORT TERM GOALS: Target date: 10/21/22  Pt will be educated in and independent in proper sitting posture using lumbar roll prn Baseline: Goal status: INITIAL  2.  Pt will report decreased muscle soreness in bilateral upper back x 25% Baseline:  Goal status: INITIAL  3.  Pt will be compliant with a HEP for postural strengthening/ROM Baseline:  Goal status: INITIAL   LONG TERM GOALS: Target date: 11/04/22  Pt will be able to bring his head to neutral position for safety with ambulation/driving Baseline:  Goal status: INITIAL  2.  Pt will have decreased upper back pain by 50% or more Baseline:  Goal status: INITIAL  3.  Pt will be able to flex right shoulder to 115 degrees with minimal compensation for improved reaching ability Baseline:  Goal status: INITIAL 4; Pt will be able to maintain tandem stance on both sides x 10 sec to demonstrated improved balance. Goal status:NEW   PT FREQUENCY: 2x/week  PT DURATION: 6 weeks  PLANNED INTERVENTIONS: Therapeutic exercises, Therapeutic activity, Neuromuscular re-education, Balance training, Gait training, Patient/Family education, Self Care, Joint mobilization, Aquatic Therapy, Manual therapy, and  Re-evaluation  PLAN FOR NEXT SESSION: See how aquatic session went. Was pt less sore?  Ane Payment, PTA 10/20/22 10:17 AM

## 2022-10-20 ENCOUNTER — Inpatient Hospital Stay: Payer: Medicare Other

## 2022-10-20 ENCOUNTER — Ambulatory Visit: Payer: Medicare Other | Attending: Nurse Practitioner | Admitting: Nurse Practitioner

## 2022-10-20 ENCOUNTER — Ambulatory Visit: Payer: Medicare Other | Admitting: Physical Therapy

## 2022-10-20 ENCOUNTER — Encounter: Payer: Self-pay | Admitting: Physical Therapy

## 2022-10-20 ENCOUNTER — Other Ambulatory Visit: Payer: Self-pay

## 2022-10-20 VITALS — BP 176/76 | HR 59 | Temp 98.2°F | Resp 18 | Wt 239.0 lb

## 2022-10-20 VITALS — BP 174/82 | HR 64 | Ht 70.0 in | Wt 252.2 lb

## 2022-10-20 DIAGNOSIS — I5033 Acute on chronic diastolic (congestive) heart failure: Secondary | ICD-10-CM | POA: Diagnosis not present

## 2022-10-20 DIAGNOSIS — I480 Paroxysmal atrial fibrillation: Secondary | ICD-10-CM | POA: Diagnosis not present

## 2022-10-20 DIAGNOSIS — E876 Hypokalemia: Secondary | ICD-10-CM | POA: Diagnosis not present

## 2022-10-20 DIAGNOSIS — R278 Other lack of coordination: Secondary | ICD-10-CM

## 2022-10-20 DIAGNOSIS — I10 Essential (primary) hypertension: Secondary | ICD-10-CM | POA: Diagnosis not present

## 2022-10-20 DIAGNOSIS — R293 Abnormal posture: Secondary | ICD-10-CM

## 2022-10-20 DIAGNOSIS — M542 Cervicalgia: Secondary | ICD-10-CM | POA: Diagnosis not present

## 2022-10-20 DIAGNOSIS — Z86718 Personal history of other venous thrombosis and embolism: Secondary | ICD-10-CM | POA: Diagnosis not present

## 2022-10-20 DIAGNOSIS — Z7961 Long term (current) use of immunomodulator: Secondary | ICD-10-CM | POA: Diagnosis not present

## 2022-10-20 DIAGNOSIS — D472 Monoclonal gammopathy: Secondary | ICD-10-CM | POA: Diagnosis not present

## 2022-10-20 DIAGNOSIS — I119 Hypertensive heart disease without heart failure: Secondary | ICD-10-CM | POA: Diagnosis not present

## 2022-10-20 DIAGNOSIS — S22000A Wedge compression fracture of unspecified thoracic vertebra, initial encounter for closed fracture: Secondary | ICD-10-CM | POA: Diagnosis not present

## 2022-10-20 DIAGNOSIS — K760 Fatty (change of) liver, not elsewhere classified: Secondary | ICD-10-CM | POA: Diagnosis not present

## 2022-10-20 DIAGNOSIS — C9000 Multiple myeloma not having achieved remission: Secondary | ICD-10-CM

## 2022-10-20 DIAGNOSIS — M25511 Pain in right shoulder: Secondary | ICD-10-CM | POA: Diagnosis not present

## 2022-10-20 DIAGNOSIS — Z5112 Encounter for antineoplastic immunotherapy: Secondary | ICD-10-CM | POA: Diagnosis not present

## 2022-10-20 DIAGNOSIS — M6281 Muscle weakness (generalized): Secondary | ICD-10-CM | POA: Diagnosis not present

## 2022-10-20 DIAGNOSIS — Z515 Encounter for palliative care: Secondary | ICD-10-CM | POA: Diagnosis not present

## 2022-10-20 DIAGNOSIS — Z79899 Other long term (current) drug therapy: Secondary | ICD-10-CM | POA: Diagnosis not present

## 2022-10-20 DIAGNOSIS — I700 Atherosclerosis of aorta: Secondary | ICD-10-CM | POA: Diagnosis not present

## 2022-10-20 DIAGNOSIS — G8929 Other chronic pain: Secondary | ICD-10-CM | POA: Diagnosis not present

## 2022-10-20 DIAGNOSIS — G4733 Obstructive sleep apnea (adult) (pediatric): Secondary | ICD-10-CM

## 2022-10-20 DIAGNOSIS — R1310 Dysphagia, unspecified: Secondary | ICD-10-CM | POA: Diagnosis not present

## 2022-10-20 DIAGNOSIS — R21 Rash and other nonspecific skin eruption: Secondary | ICD-10-CM | POA: Diagnosis not present

## 2022-10-20 DIAGNOSIS — R0600 Dyspnea, unspecified: Secondary | ICD-10-CM | POA: Diagnosis not present

## 2022-10-20 DIAGNOSIS — M5126 Other intervertebral disc displacement, lumbar region: Secondary | ICD-10-CM | POA: Diagnosis not present

## 2022-10-20 DIAGNOSIS — J90 Pleural effusion, not elsewhere classified: Secondary | ICD-10-CM | POA: Diagnosis not present

## 2022-10-20 DIAGNOSIS — Z7952 Long term (current) use of systemic steroids: Secondary | ICD-10-CM | POA: Diagnosis not present

## 2022-10-20 DIAGNOSIS — Z7901 Long term (current) use of anticoagulants: Secondary | ICD-10-CM | POA: Diagnosis not present

## 2022-10-20 DIAGNOSIS — R600 Localized edema: Secondary | ICD-10-CM | POA: Diagnosis not present

## 2022-10-20 DIAGNOSIS — M8580 Other specified disorders of bone density and structure, unspecified site: Secondary | ICD-10-CM | POA: Diagnosis not present

## 2022-10-20 DIAGNOSIS — Z923 Personal history of irradiation: Secondary | ICD-10-CM | POA: Diagnosis not present

## 2022-10-20 DIAGNOSIS — Z881 Allergy status to other antibiotic agents status: Secondary | ICD-10-CM | POA: Diagnosis not present

## 2022-10-20 DIAGNOSIS — M533 Sacrococcygeal disorders, not elsewhere classified: Secondary | ICD-10-CM | POA: Diagnosis not present

## 2022-10-20 LAB — CBC WITH DIFFERENTIAL (CANCER CENTER ONLY)
Abs Immature Granulocytes: 0.04 10*3/uL (ref 0.00–0.07)
Basophils Absolute: 0 10*3/uL (ref 0.0–0.1)
Basophils Relative: 0 %
Eosinophils Absolute: 0.1 10*3/uL (ref 0.0–0.5)
Eosinophils Relative: 1 %
HCT: 34.3 % — ABNORMAL LOW (ref 39.0–52.0)
Hemoglobin: 10.7 g/dL — ABNORMAL LOW (ref 13.0–17.0)
Immature Granulocytes: 1 %
Lymphocytes Relative: 6 %
Lymphs Abs: 0.5 10*3/uL — ABNORMAL LOW (ref 0.7–4.0)
MCH: 30.5 pg (ref 26.0–34.0)
MCHC: 31.2 g/dL (ref 30.0–36.0)
MCV: 97.7 fL (ref 80.0–100.0)
Monocytes Absolute: 0.4 10*3/uL (ref 0.1–1.0)
Monocytes Relative: 5 %
Neutro Abs: 7.3 10*3/uL (ref 1.7–7.7)
Neutrophils Relative %: 87 %
Platelet Count: 161 10*3/uL (ref 150–400)
RBC: 3.51 MIL/uL — ABNORMAL LOW (ref 4.22–5.81)
RDW: 16.7 % — ABNORMAL HIGH (ref 11.5–15.5)
WBC Count: 8.4 10*3/uL (ref 4.0–10.5)
nRBC: 0 % (ref 0.0–0.2)

## 2022-10-20 LAB — CMP (CANCER CENTER ONLY)
ALT: 22 U/L (ref 0–44)
AST: 17 U/L (ref 15–41)
Albumin: 3.6 g/dL (ref 3.5–5.0)
Alkaline Phosphatase: 103 U/L (ref 38–126)
Anion gap: 5 (ref 5–15)
BUN: 13 mg/dL (ref 8–23)
CO2: 28 mmol/L (ref 22–32)
Calcium: 8.8 mg/dL — ABNORMAL LOW (ref 8.9–10.3)
Chloride: 108 mmol/L (ref 98–111)
Creatinine: 0.76 mg/dL (ref 0.61–1.24)
GFR, Estimated: >60 mL/min (ref >60)
Glucose, Bld: 110 mg/dL — ABNORMAL HIGH (ref 70–99)
Potassium: 3.9 mmol/L (ref 3.5–5.1)
Sodium: 141 mmol/L (ref 135–145)
Total Bilirubin: 0.5 mg/dL (ref 0.3–1.2)
Total Protein: 5.6 g/dL — ABNORMAL LOW (ref 6.5–8.1)

## 2022-10-20 MED ORDER — BORTEZOMIB CHEMO SQ INJECTION 3.5 MG (2.5MG/ML)
1.3000 mg/m2 | Freq: Once | INTRAMUSCULAR | Status: AC
  Administered 2022-10-20: 3 mg via SUBCUTANEOUS
  Filled 2022-10-20: qty 1.2

## 2022-10-20 MED ORDER — IRBESARTAN 150 MG PO TABS: 150.0000 mg | ORAL_TABLET | Freq: Every day | ORAL | 5 refills | Status: DC

## 2022-10-20 NOTE — Telephone Encounter (Signed)
Gaston Islam., NP  You19 hours ago (2:23 PM)    No, we will discuss further at patient's upcoming visit tomorrow.

## 2022-10-20 NOTE — Patient Instructions (Signed)
Medication Instructions:  STOP Lisinopril START Irbesartan 150mg  take 1 tablet once a day  TAKE Lasix 20mg  daily for 3 days then take as needed *If you need a refill on your cardiac medications before your next appointment, please call your pharmacy*   Lab Work: None ordered If you have labs (blood work) drawn today and your tests are completely normal, you will receive your results only by: MyChart Message (if you have MyChart) OR A paper copy in the mail If you have any lab test that is abnormal or we need to change your treatment, we will call you to review the results.   Testing/Procedures: None ordered   Follow-Up: At [facility], you and your health needs are our priority.  As part of our continuing mission to provide you with exceptional heart care, we have created designated Provider Care Teams.  These Care Teams include your primary Cardiologist (physician) and Advanced Practice Providers (APPs -  Physician Assistants and Nurse Practitioners) who all work together to provide you with the care you need, when you need it.  We recommend signing up for the patient portal called "MyChart".  Sign up information is provided on this After Visit Summary.  MyChart is used to connect with patients for Virtual Visits (Telemedicine).  Patients are able to view lab/test results, encounter notes, upcoming appointments, etc.  Non-urgent messages can be sent to your provider as well.   To learn more about what you can do with MyChart, go to [email or web address].    Your next appointment:   FOLLOW UP AS SCHEDULED   Provider:   [name], MD     Other Instructions You have been referred to Hypertension Clinic Check your blood pressure daily for 2 weeks, then contact the office with your readings.

## 2022-10-20 NOTE — Patient Instructions (Signed)
[facility]  Discharge Instructions: Thank you for choosing [facility] to provide your oncology and hematology care.   If you have a lab appointment with the Cancer Center, please go directly to the Cancer Center and check in at the registration area.   Wear comfortable clothing and clothing appropriate for easy access to any Portacath or PICC line.   We strive to give you quality time with your provider. You may need to reschedule your appointment if you arrive late (15 or more minutes).  Arriving late affects you and other patients whose appointments are after yours.  Also, if you miss three or more appointments without notifying the office, you may be dismissed from the clinic at the provider's discretion.      For prescription refill requests, have your pharmacy contact our office and allow 72 hours for refills to be completed.    Today you received the following chemotherapy and/or immunotherapy agents: bortezomib      To help prevent nausea and vomiting after your treatment, we encourage you to take your nausea medication as directed.  BELOW ARE SYMPTOMS THAT SHOULD BE REPORTED IMMEDIATELY: *FEVER GREATER THAN 100.4 F (38 C) OR HIGHER *CHILLS OR SWEATING *NAUSEA AND VOMITING THAT IS NOT CONTROLLED WITH YOUR NAUSEA MEDICATION *UNUSUAL SHORTNESS OF BREATH *UNUSUAL BRUISING OR BLEEDING *URINARY PROBLEMS (pain or burning when urinating, or frequent urination) *BOWEL PROBLEMS (unusual diarrhea, constipation, pain near the anus) TENDERNESS IN MOUTH AND THROAT WITH OR WITHOUT PRESENCE OF ULCERS (sore throat, sores in mouth, or a toothache) UNUSUAL RASH, SWELLING OR PAIN  UNUSUAL VAGINAL DISCHARGE OR ITCHING   Items with * indicate a potential emergency and should be followed up as soon as possible or go to the Emergency Department if any problems should occur.  Please show the CHEMOTHERAPY ALERT CARD or IMMUNOTHERAPY ALERT CARD at  check-in to the Emergency Department and triage nurse.  Should you have questions after your visit or need to cancel or reschedule your appointment, please contact [facility]  Dept: [phone]  and follow the prompts.  Office hours are 8:00 a.m. to 4:30 p.m. Monday - Friday. Please note that voicemails left after 4:00 p.m. may not be returned until the following business day.  We are closed weekends and major holidays. You have access to a nurse at all times for urgent questions. Please call the main number to the clinic Dept: [phone] and follow the prompts.   For any non-urgent questions, you may also contact your provider using MyChart. We now offer e-Visits for anyone 18 and older to request care online for non-urgent symptoms. For details visit [email or web address].   Also download the MyChart app! Go to the app store, search "MyChart", open the app, select [facility], and log in with your MyChart username and password.   

## 2022-10-21 ENCOUNTER — Encounter: Payer: Self-pay | Admitting: Nurse Practitioner

## 2022-10-21 DIAGNOSIS — I110 Hypertensive heart disease with heart failure: Secondary | ICD-10-CM | POA: Diagnosis not present

## 2022-10-21 DIAGNOSIS — G4730 Sleep apnea, unspecified: Secondary | ICD-10-CM | POA: Diagnosis not present

## 2022-10-21 DIAGNOSIS — D6869 Other thrombophilia: Secondary | ICD-10-CM | POA: Diagnosis not present

## 2022-10-21 DIAGNOSIS — I5032 Chronic diastolic (congestive) heart failure: Secondary | ICD-10-CM | POA: Diagnosis not present

## 2022-10-21 DIAGNOSIS — I480 Paroxysmal atrial fibrillation: Secondary | ICD-10-CM | POA: Diagnosis not present

## 2022-10-22 ENCOUNTER — Other Ambulatory Visit: Payer: Self-pay | Admitting: Internal Medicine

## 2022-10-23 ENCOUNTER — Ambulatory Visit (HOSPITAL_COMMUNITY)
Admission: RE | Admit: 2022-10-23 | Discharge: 2022-10-23 | Disposition: A | Discharge status: Home / Self Care | Payer: Medicare Other | Source: Ambulatory Visit | Attending: Physician Assistant | Admitting: Physician Assistant

## 2022-10-23 DIAGNOSIS — C9000 Multiple myeloma not having achieved remission: Secondary | ICD-10-CM

## 2022-10-23 DIAGNOSIS — M40204 Unspecified kyphosis, thoracic region: Secondary | ICD-10-CM | POA: Diagnosis not present

## 2022-10-23 DIAGNOSIS — M47812 Spondylosis without myelopathy or radiculopathy, cervical region: Secondary | ICD-10-CM | POA: Diagnosis not present

## 2022-10-23 DIAGNOSIS — M545 Low back pain, unspecified: Secondary | ICD-10-CM | POA: Diagnosis not present

## 2022-10-23 DIAGNOSIS — M546 Pain in thoracic spine: Secondary | ICD-10-CM | POA: Diagnosis not present

## 2022-10-23 MED ORDER — GADOBUTROL 1 MMOL/ML IV SOLN
10.0000 mL | Freq: Once | INTRAVENOUS | Status: AC | PRN
  Administered 2022-10-23: 10 mL via INTRAVENOUS

## 2022-10-25 ENCOUNTER — Encounter (HOSPITAL_COMMUNITY): Payer: Self-pay | Admitting: Radiology

## 2022-10-25 ENCOUNTER — Ambulatory Visit: Payer: Medicare Other

## 2022-10-25 ENCOUNTER — Emergency Department (HOSPITAL_COMMUNITY)
Admission: EM | Admit: 2022-10-25 | Discharge: 2022-10-25 | Disposition: A | Discharge status: Home / Self Care | Payer: Medicare Other | Attending: Emergency Medicine | Admitting: Emergency Medicine

## 2022-10-25 ENCOUNTER — Other Ambulatory Visit: Payer: Self-pay

## 2022-10-25 ENCOUNTER — Emergency Department (HOSPITAL_COMMUNITY): Payer: Medicare Other

## 2022-10-25 DIAGNOSIS — R202 Paresthesia of skin: Secondary | ICD-10-CM | POA: Diagnosis not present

## 2022-10-25 DIAGNOSIS — R109 Unspecified abdominal pain: Secondary | ICD-10-CM | POA: Diagnosis not present

## 2022-10-25 DIAGNOSIS — R29898 Other symptoms and signs involving the musculoskeletal system: Secondary | ICD-10-CM | POA: Diagnosis not present

## 2022-10-25 DIAGNOSIS — K429 Umbilical hernia without obstruction or gangrene: Secondary | ICD-10-CM | POA: Diagnosis not present

## 2022-10-25 DIAGNOSIS — M2578 Osteophyte, vertebrae: Secondary | ICD-10-CM | POA: Diagnosis not present

## 2022-10-25 DIAGNOSIS — K529 Noninfective gastroenteritis and colitis, unspecified: Secondary | ICD-10-CM

## 2022-10-25 DIAGNOSIS — M5126 Other intervertebral disc displacement, lumbar region: Secondary | ICD-10-CM | POA: Diagnosis not present

## 2022-10-25 DIAGNOSIS — Z4789 Encounter for other orthopedic aftercare: Secondary | ICD-10-CM | POA: Diagnosis not present

## 2022-10-25 DIAGNOSIS — M40204 Unspecified kyphosis, thoracic region: Secondary | ICD-10-CM | POA: Diagnosis not present

## 2022-10-25 DIAGNOSIS — R9431 Abnormal electrocardiogram [ECG] [EKG]: Secondary | ICD-10-CM | POA: Diagnosis not present

## 2022-10-25 DIAGNOSIS — R200 Anesthesia of skin: Secondary | ICD-10-CM | POA: Diagnosis not present

## 2022-10-25 DIAGNOSIS — K5730 Diverticulosis of large intestine without perforation or abscess without bleeding: Secondary | ICD-10-CM | POA: Diagnosis not present

## 2022-10-25 DIAGNOSIS — K4020 Bilateral inguinal hernia, without obstruction or gangrene, not specified as recurrent: Secondary | ICD-10-CM | POA: Diagnosis not present

## 2022-10-25 DIAGNOSIS — Z7901 Long term (current) use of anticoagulants: Secondary | ICD-10-CM | POA: Diagnosis not present

## 2022-10-25 DIAGNOSIS — Z8579 Personal history of other malignant neoplasms of lymphoid, hematopoietic and related tissues: Secondary | ICD-10-CM | POA: Diagnosis not present

## 2022-10-25 DIAGNOSIS — W19XXXA Unspecified fall, initial encounter: Secondary | ICD-10-CM | POA: Diagnosis not present

## 2022-10-25 DIAGNOSIS — I700 Atherosclerosis of aorta: Secondary | ICD-10-CM | POA: Diagnosis not present

## 2022-10-25 DIAGNOSIS — N200 Calculus of kidney: Secondary | ICD-10-CM | POA: Diagnosis not present

## 2022-10-25 DIAGNOSIS — R531 Weakness: Secondary | ICD-10-CM | POA: Diagnosis not present

## 2022-10-25 DIAGNOSIS — Z743 Need for continuous supervision: Secondary | ICD-10-CM | POA: Diagnosis not present

## 2022-10-25 DIAGNOSIS — M898X8 Other specified disorders of bone, other site: Secondary | ICD-10-CM | POA: Diagnosis not present

## 2022-10-25 DIAGNOSIS — M5136 Other intervertebral disc degeneration, lumbar region: Secondary | ICD-10-CM | POA: Diagnosis not present

## 2022-10-25 LAB — TROPONIN I (HIGH SENSITIVITY)
Troponin I (High Sensitivity): 17 ng/L (ref <18)
Troponin I (High Sensitivity): 14 ng/L (ref <18)

## 2022-10-25 LAB — COMPREHENSIVE METABOLIC PANEL
ALT: 25 U/L (ref 0–44)
AST: 17 U/L (ref 15–41)
Albumin: 3.5 g/dL (ref 3.5–5.0)
Alkaline Phosphatase: 84 U/L (ref 38–126)
Anion gap: 13 (ref 5–15)
BUN: 22 mg/dL (ref 8–23)
CO2: 26 mmol/L (ref 22–32)
Calcium: 8.6 mg/dL — ABNORMAL LOW (ref 8.9–10.3)
Chloride: 100 mmol/L (ref 98–111)
Creatinine, Ser: 0.82 mg/dL (ref 0.61–1.24)
GFR, Estimated: >60 mL/min (ref >60)
Glucose, Bld: 96 mg/dL (ref 70–99)
Potassium: 3 mmol/L — ABNORMAL LOW (ref 3.5–5.1)
Sodium: 139 mmol/L (ref 135–145)
Total Bilirubin: 0.8 mg/dL (ref 0.3–1.2)
Total Protein: 6.1 g/dL — ABNORMAL LOW (ref 6.5–8.1)

## 2022-10-25 LAB — CBC WITH DIFFERENTIAL/PLATELET
Abs Immature Granulocytes: 0.07 10*3/uL (ref 0.00–0.07)
Basophils Absolute: 0 10*3/uL (ref 0.0–0.1)
Basophils Relative: 0 %
Eosinophils Absolute: 0.2 10*3/uL (ref 0.0–0.5)
Eosinophils Relative: 2 %
HCT: 41.6 % (ref 39.0–52.0)
Hemoglobin: 13.1 g/dL (ref 13.0–17.0)
Immature Granulocytes: 1 %
Lymphocytes Relative: 7 %
Lymphs Abs: 0.7 10*3/uL (ref 0.7–4.0)
MCH: 30.1 pg (ref 26.0–34.0)
MCHC: 31.5 g/dL (ref 30.0–36.0)
MCV: 95.6 fL (ref 80.0–100.0)
Monocytes Absolute: 2 10*3/uL — ABNORMAL HIGH (ref 0.1–1.0)
Monocytes Relative: 19 %
Neutro Abs: 7.5 10*3/uL (ref 1.7–7.7)
Neutrophils Relative %: 71 %
Platelets: 123 10*3/uL — ABNORMAL LOW (ref 150–400)
RBC: 4.35 MIL/uL (ref 4.22–5.81)
RDW: 16.4 % — ABNORMAL HIGH (ref 11.5–15.5)
WBC: 10.5 10*3/uL (ref 4.0–10.5)
nRBC: 0 % (ref 0.0–0.2)

## 2022-10-25 LAB — PROTIME-INR
INR: 1.1 (ref 0.8–1.2)
Prothrombin Time: 14.1 seconds (ref 11.4–15.2)

## 2022-10-25 LAB — URINALYSIS, ROUTINE W REFLEX MICROSCOPIC
Bilirubin Urine: NEGATIVE
Glucose, UA: NEGATIVE mg/dL
Hgb urine dipstick: NEGATIVE
Ketones, ur: NEGATIVE mg/dL
Leukocytes,Ua: NEGATIVE
Nitrite: NEGATIVE
Protein, ur: NEGATIVE mg/dL
Specific Gravity, Urine: 1.015 (ref 1.005–1.030)
pH: 6 (ref 5.0–8.0)

## 2022-10-25 LAB — LIPASE, BLOOD: Lipase: 30 U/L (ref 11–51)

## 2022-10-25 MED ORDER — LACTATED RINGERS IV BOLUS
1000.0000 mL | Freq: Once | INTRAVENOUS | Status: AC
  Administered 2022-10-25: 1000 mL via INTRAVENOUS

## 2022-10-25 MED ORDER — IOHEXOL 300 MG/ML  SOLN
100.0000 mL | Freq: Once | INTRAMUSCULAR | Status: AC | PRN
  Administered 2022-10-25: 100 mL via INTRAVENOUS

## 2022-10-25 MED ORDER — POTASSIUM CHLORIDE CRYS ER 20 MEQ PO TBCR
40.0000 meq | EXTENDED_RELEASE_TABLET | Freq: Once | ORAL | Status: AC
  Administered 2022-10-25: 40 meq via ORAL
  Filled 2022-10-25: qty 2

## 2022-10-25 NOTE — ED Notes (Signed)
Pt assisted to bathroom via wheelchair. Stands and pivots well on own ability.

## 2022-10-25 NOTE — Discharge Instructions (Addendum)
I believe that your leg weakness is likely secondary to your herniated disc, please follow-up with your primary care doctor, and oncologist.  Make sure you drink lots of fluids, and rest.  Above is food choices to help relieve your diarrhea.  Return to the ER if you are unable to keep anything down, have weakness, or difficulty walking.

## 2022-10-25 NOTE — ED Provider Notes (Signed)
Received handoff from [place] PA. Hx of multiple myeloma. Felt weakness when ambulating in middle of night. No fall. Lowered self to ground. No neurodeficits. CT pending and labs.   Physical Exam  BP (!) 144/80   Pulse (!) 122   Temp 97.6 F (36.4 C) (Oral)   Resp 18   Ht 5\' 10"  (1.778 m)   Wt 113.4 kg   SpO2 97%   BMI 35.87 kg/m   Physical Exam Vitals and nursing note reviewed.  Constitutional:      General: He is not in acute distress.    Appearance: He is well-developed.  HENT:     Head: Normocephalic and atraumatic.  Eyes:     Conjunctiva/sclera: Conjunctivae normal.  Cardiovascular:     Rate and Rhythm: Normal rate and regular rhythm.     Heart sounds: No murmur heard. Pulmonary:     Effort: Pulmonary effort is normal. No respiratory distress.     Breath sounds: Normal breath sounds.  Abdominal:     Palpations: Abdomen is soft.     Tenderness: There is no abdominal tenderness.  Musculoskeletal:        General: No swelling.     Cervical back: Neck supple.     Comments: 5/5 strength of bilateral lower extremities  Skin:    General: Skin is warm and dry.     Capillary Refill: Capillary refill takes less than 2 seconds.  Neurological:     Mental Status: He is alert.     Cranial Nerves: Cranial nerves 2-12 are intact.     Motor: Motor function is intact.     Comments: Perceived diminished sensation in bilateral lower extremities.  Psychiatric:        Mood and Affect: Mood normal.     Procedures  Procedures  ED Course / MDM    Medical Decision Making Patient is 72 year old male, here for bilateral lower extremity paresthesias,/weakness.  Occurred while ambulating in the middle the night also here for GI bug.  Evaluated by Alonna Minium, see her note for further detail.  He has no neurodeficits on exam except for a perceived sensory deficit.  It is bilateral.  He has no saddle anesthesia, urinary symptoms, and he has good strength.  I reviewed his MRI, done on 6/22,  and it shows just a herniated disc, no new lesions in the spine.  He was able to ambulate while he was here in the ER, he states while sitting down his episodes or deficits are improving.  I with this is likely secondary to the herniated disc, and we discussed follow-up with primary care doctor, he has no red flag symptoms.  Additionally he has no more new mets to the spine.  His labs were unremarkable, his tachycardia improved, and this is likely secondary to dehydration, and not taking his amiodarone this morning.  He states he is feeling good, and able to ambulate, and will be discharged home.  Spoke with Dr. Leonides Schanz, and he acknowledged, and will follow patient.  Amount and/or Complexity of Data Reviewed Labs: ordered. Radiology: ordered.  Risk Prescription drug management.          Pete Pelt, [place] 10/25/22 1009    Bethann Berkshire, MD 10/26/22 1209

## 2022-10-25 NOTE — ED Provider Notes (Signed)
EMERGENCY DEPARTMENT AT [facility] Provider Note   CSN: [id number] Arrival date & time: 10/25/22  0449     History  Chief Complaint  Patient presents with   Fall   Numbness    legs    Travis [name] is a 72 y.o. male who presents with concern for bilateral lower extremity weakness.  States he woke up in the night to get some Tums for reflux symptoms when he felt his abdomen cramping and turned to go back to the bathroom.  On his way back he felt like his legs are going to give out.  He was able to lower himself to the ground.  He did not fall.  Was too weak in his legs to stand following this incident and ultimately was incontinent of bowels with diarrhea.  Has continued to have some abdominal cramping.  No urinary symptoms no urinary incontinence, saddle anesthesia, numbness tingling or weakness in the legs prior to this.  He denies numbness in the legs but endorses that they feel symmetrically weaker than typical.  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  No known ill contacts.  States he feels similar to when he had norovirus in the past.  He is anticoagulated on Eliquis for A-fib.  I personally reviewed medical records.  Additionally above listed history is history of DVT.  HPI     Home Medications Prior to Admission medications   Medication Sig Start Date End Date Taking? Authorizing Provider  acyclovir (ZOVIRAX) 400 MG tablet Take 1 tablet (400 mg total) by mouth 2 (two) times daily. 12/10/21   Jaci Standard, MD  albuterol (VENTOLIN HFA) 108 (90 Base) MCG/ACT inhaler Inhale 2 puffs into the lungs every 6 (six) hours as needed for wheezing or shortness of breath. 10/03/22   Shalhoub, Deno Lunger, MD  amiodarone (PACERONE) 200 MG tablet Take 1 tablet (200 mg total) by mouth daily. Start after finishing 40 mg twice daily dose of  days Patient taking differently: Take 200 mg by mouth daily. 07/11/22    Lanae Boast, MD  apixaban (ELIQUIS) 5 MG TABS tablet Take 1 tablet (5 mg total) by mouth 2 (two) times daily. 06/29/22   Rhetta Mura, MD  cholecalciferol (VITAMIN D3) 25 MCG (1000 UNIT) tablet Take 1,000 Units by mouth daily.    [provider]  Cyanocobalamin (VITAMIN B-12 PO) Take 1,000 mcg by mouth daily.    [provider]  dexamethasone (DECADRON) 4 MG tablet Take 10 tablets (40 mg total) by mouth every 7 (seven) days. Take 40 mg by mouth once a day in the morning of chemo on Wednesdays- or unless otherwise instructed 10/04/22   Jaci Standard, MD  fluticasone [facility]) 50 MCG/ACT nasal spray Place 1 spray into both nostrils daily as needed for allergies or rhinitis.    [provider]  furosemide (LASIX) 20 MG tablet Take 3 tablets (60 mg total) by mouth daily. If your weight increases by more than 2 pounds compared to the day prior or 5 pounds over the past week take an additional dose that day. 10/03/22   Shalhoub, Deno Lunger, MD  gabapentin (NEURONTIN) 300 MG capsule TAKE TWO CAPSULES BY MOUTH TWICE A DAY Patient taking differently: Take 600 mg by mouth 2 (two) times daily. 03/29/22   Nita Sickle K, DO  hydrALAZINE (APRESOLINE) 50 MG tablet Take 1.5 tablets (75 mg total) by mouth 3 (three)  times daily. 10/16/22   Pricilla Riffle, MD  irbesartan (AVAPRO) 150 MG tablet Take 1 tablet (150 mg total) by mouth daily. 10/20/22   Gaston Islam., NP  metolazone (ZAROXOLYN) 2.5 MG tablet Take 1 tablet (2.5 mg total) by mouth every other day. 10/22/22   Pricilla Riffle, MD  Omega-3 Fatty Acids (FISH OIL PO) Take 1 Capful by mouth daily.    [provider]  pomalidomide (POMALYST) 2 MG capsule Take 1 capsule (2 mg total) by mouth daily. Take one tablet (2 mg by mouth) daily for 21 days, then take 7 days off [employer] [facility] #[id number]  Date obtained 09/17/22 09/17/22   Jaci Standard, MD  potassium chloride SA (KLOR-CON M) 20 MEQ tablet Take 2 tablets (40 mEq total) by  mouth daily. Take an extra tablet on the day you take the Metolazone (60 meq total) Patient taking differently: Take 40 mEq by mouth 2 (two) times daily. 2 tabs AM and 1 PM 08/18/22   Pricilla Riffle, MD  sodium chloride (OCEAN) 0.65 % SOLN nasal spray Place 1 spray into both nostrils as needed for congestion.    [provider]      Allergies    Linezolid and Vancomycin    Review of Systems   Review of Systems  Constitutional: Negative.   HENT: Negative.    Respiratory: Negative.    Cardiovascular: Negative.   Gastrointestinal:  Positive for abdominal pain, diarrhea, nausea and vomiting.  Neurological:  Positive for weakness and numbness. Negative for dizziness, seizures, syncope, facial asymmetry, speech difficulty, light-headedness and headaches.    Physical Exam Updated Vital Signs BP (!) 144/102   Pulse (!) 128   Temp 97.6 F (36.4 C) (Oral)   Resp 17   Ht 5\' 10"  (1.778 m)   Wt 113.4 kg   SpO2 97%   BMI 35.87 kg/m  Physical Exam Vitals and nursing note reviewed.  Constitutional:      Appearance: He is obese. He is not ill-appearing or toxic-appearing.  HENT:     Head: Normocephalic and atraumatic.     Mouth/Throat:     Mouth: Mucous membranes are moist.     Pharynx: No oropharyngeal exudate or posterior oropharyngeal erythema.  Eyes:     General:        Right eye: No discharge.        Left eye: No discharge.     Conjunctiva/sclera: Conjunctivae normal.  Cardiovascular:     Rate and Rhythm: Normal rate and regular rhythm.     Pulses: Normal pulses.     Heart sounds: Normal heart sounds.  Pulmonary:     Effort: Pulmonary effort is normal. No respiratory distress.     Breath sounds: Normal breath sounds. No wheezing or rales.  Abdominal:     General: Abdomen is protuberant. Bowel sounds are increased. There is no distension.     Palpations: Abdomen is soft.     Tenderness: There is no abdominal tenderness. There is no guarding or rebound.   Musculoskeletal:        General: No deformity.     Cervical back: Neck supple.  Skin:    General: Skin is warm and dry.  Neurological:     Mental Status: He is alert and oriented to person, place, and time. Mental status is at baseline.     GCS: GCS eye subscore is 4. GCS verbal subscore is 5. GCS motor subscore is 6.  Cranial Nerves: Cranial nerves 2-12 are intact.     Sensory: Sensation is intact.     Motor: Motor function is intact.     Coordination: Coordination is intact.     Comments: Gait deferred for patient safety  Psychiatric:        Mood and Affect: Mood normal.     ED Results / Procedures / Treatments   Labs (all labs ordered are listed, but only abnormal results are displayed) Labs Reviewed  CBC WITH DIFFERENTIAL/PLATELET - Abnormal; Notable for the following components:      Result Value   RDW 16.4 (*)    Platelets 123 (*)    Monocytes Absolute 2.0 (*)    All other components within normal limits  URINALYSIS, ROUTINE W REFLEX MICROSCOPIC  COMPREHENSIVE METABOLIC PANEL  LIPASE, BLOOD  PROTIME-INR  TROPONIN I (HIGH SENSITIVITY)    EKG None  Radiology No results found.  Procedures Procedures    Medications Ordered in ED Medications  lactated ringers bolus 1,000 mL (1,000 mLs Intravenous New Bag/Given 10/25/22 1324)    ED Course/ Medical Decision Making/ A&P                            Medical Decision Making 72 year old male presents with concern for abdominal pain diarrhea nausea and lower extremity weakness that started this morning.  Hypertensive and tachycardic on intake, tachycardia significantly proved at time of my evaluation with heart rate of 105.  Cardiopulmonary exam is reassuring, abdominal exam is benign.  Neurologic exam without focal deficit.  The differential diagnosis of weakness includes but is not limited to: Neurologic causes: GBS, myasthenia gravis, CVA, MS, ALS, transverse myelitis, spinal cord injury, CVA,  botulism Other causes: ACS, Arrhythmia, syncope, orthostatic hypotension, sepsis, hypoglycemia, electrolyte disturbance, hypothyroidism, respiratory failure, symptomatic anemia, dehydration, heat injury, polypharmacy, malignancy.  The differential diagnosis of diarrhea includes but is not limited to: Viral: norovirus/rotavirus; Bacterial-Campylobacter,Shigella, Salmonella, Escherichia coli, E. coli 0157:H7, Yersinia enterocolitica, Vibrio cholerae, Clostridium difficile. Parasitic- Giardia lamblia, Cryptosporidium,Entamoeba histolytica,Cyclospora, Microsporidium. Toxin- Staphylococcus aureus, Bacillus cereus.  Noninfectious causes include GI Bleed, Appendicitis, Mesenteric Ischemia, Diverticulitis, Adrenal Crisis, Thyroid Storm, Toxicologic exposures, Antibiotic or drug-associated, inflammatory bowel disease.     Care of this patient signed out to oncoming ED provider Barnie Alderman, PA-C at time of shift change. All pertinent HPI, physical exam, and laboratory findings were discussed with them prior to my departure. Disposition of patient pending completion of workup, reevaluation, and clinical judgement of oncoming ED provider.   This chart was dictated using voice recognition software, Dragon. Despite the best efforts of this provider to proofread and correct errors, errors may still occur which can change documentation meaning.   Final Clinical Impression(s) / ED Diagnoses Final diagnoses:  None    Rx / DC Orders ED Discharge Orders     None         Sherrilee Gilles 10/25/22 0706    Nira Conn, MD 10/26/22 [phone]

## 2022-10-25 NOTE — ED Triage Notes (Signed)
Pt BIBA from home for fall, was trying to get tums when legs went numb and gave out. Had thoracis spine surgery in Feb, recovery has been slow but okay, never had numbness issues until now. Denies urinary incontinence or sx. Had some abdominal cramping and diarrhea today. No fevers, no sick contacts. Denies dizziness, vision changes. Skin tears to R forearm and R knee. On eliquis. No head trauma/LOC

## 2022-10-26 ENCOUNTER — Telehealth: Payer: Self-pay | Admitting: Physician Assistant

## 2022-10-26 NOTE — Telephone Encounter (Signed)
I notified Travis [name] by phone regarding MRI results. 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 No concerning lesions or pathologic fractures. Encouraged patient to follow up with neurosurgery for further management. He is established with Dr. Franky Macho and will reach out to the office. All of patient's questions were answered and he expressed understanding of the plan provided.

## 2022-10-27 ENCOUNTER — Other Ambulatory Visit: Payer: Self-pay

## 2022-10-27 ENCOUNTER — Inpatient Hospital Stay: Payer: Medicare Other

## 2022-10-27 ENCOUNTER — Inpatient Hospital Stay (HOSPITAL_BASED_OUTPATIENT_CLINIC_OR_DEPARTMENT_OTHER): Payer: Medicare Other | Admitting: Hematology and Oncology

## 2022-10-27 VITALS — BP 117/78 | HR 72 | Temp 97.8°F | Resp 14 | Wt 248.1 lb

## 2022-10-27 DIAGNOSIS — R600 Localized edema: Secondary | ICD-10-CM | POA: Diagnosis not present

## 2022-10-27 DIAGNOSIS — M533 Sacrococcygeal disorders, not elsewhere classified: Secondary | ICD-10-CM | POA: Diagnosis not present

## 2022-10-27 DIAGNOSIS — R21 Rash and other nonspecific skin eruption: Secondary | ICD-10-CM | POA: Diagnosis not present

## 2022-10-27 DIAGNOSIS — Z515 Encounter for palliative care: Secondary | ICD-10-CM | POA: Diagnosis not present

## 2022-10-27 DIAGNOSIS — Z7961 Long term (current) use of immunomodulator: Secondary | ICD-10-CM | POA: Diagnosis not present

## 2022-10-27 DIAGNOSIS — E876 Hypokalemia: Secondary | ICD-10-CM | POA: Diagnosis not present

## 2022-10-27 DIAGNOSIS — K760 Fatty (change of) liver, not elsewhere classified: Secondary | ICD-10-CM | POA: Diagnosis not present

## 2022-10-27 DIAGNOSIS — Z881 Allergy status to other antibiotic agents status: Secondary | ICD-10-CM | POA: Diagnosis not present

## 2022-10-27 DIAGNOSIS — Z7952 Long term (current) use of systemic steroids: Secondary | ICD-10-CM | POA: Diagnosis not present

## 2022-10-27 DIAGNOSIS — M8580 Other specified disorders of bone density and structure, unspecified site: Secondary | ICD-10-CM | POA: Diagnosis not present

## 2022-10-27 DIAGNOSIS — I700 Atherosclerosis of aorta: Secondary | ICD-10-CM | POA: Diagnosis not present

## 2022-10-27 DIAGNOSIS — C9000 Multiple myeloma not having achieved remission: Secondary | ICD-10-CM

## 2022-10-27 DIAGNOSIS — I119 Hypertensive heart disease without heart failure: Secondary | ICD-10-CM | POA: Diagnosis not present

## 2022-10-27 DIAGNOSIS — I480 Paroxysmal atrial fibrillation: Secondary | ICD-10-CM | POA: Diagnosis not present

## 2022-10-27 DIAGNOSIS — Z86718 Personal history of other venous thrombosis and embolism: Secondary | ICD-10-CM | POA: Diagnosis not present

## 2022-10-27 DIAGNOSIS — J90 Pleural effusion, not elsewhere classified: Secondary | ICD-10-CM | POA: Diagnosis not present

## 2022-10-27 DIAGNOSIS — Z5112 Encounter for antineoplastic immunotherapy: Secondary | ICD-10-CM | POA: Diagnosis not present

## 2022-10-27 DIAGNOSIS — T451X5A Adverse effect of antineoplastic and immunosuppressive drugs, initial encounter: Secondary | ICD-10-CM

## 2022-10-27 DIAGNOSIS — Z79899 Other long term (current) drug therapy: Secondary | ICD-10-CM | POA: Diagnosis not present

## 2022-10-27 DIAGNOSIS — Z923 Personal history of irradiation: Secondary | ICD-10-CM | POA: Diagnosis not present

## 2022-10-27 DIAGNOSIS — G4733 Obstructive sleep apnea (adult) (pediatric): Secondary | ICD-10-CM | POA: Diagnosis not present

## 2022-10-27 DIAGNOSIS — R0600 Dyspnea, unspecified: Secondary | ICD-10-CM | POA: Diagnosis not present

## 2022-10-27 DIAGNOSIS — D61810 Antineoplastic chemotherapy induced pancytopenia: Secondary | ICD-10-CM

## 2022-10-27 DIAGNOSIS — Z7901 Long term (current) use of anticoagulants: Secondary | ICD-10-CM | POA: Diagnosis not present

## 2022-10-27 DIAGNOSIS — R1310 Dysphagia, unspecified: Secondary | ICD-10-CM | POA: Diagnosis not present

## 2022-10-27 DIAGNOSIS — M5126 Other intervertebral disc displacement, lumbar region: Secondary | ICD-10-CM | POA: Diagnosis not present

## 2022-10-27 LAB — CBC WITH DIFFERENTIAL (CANCER CENTER ONLY)
Abs Immature Granulocytes: 0.03 10*3/uL (ref 0.00–0.07)
Basophils Absolute: 0 10*3/uL (ref 0.0–0.1)
Basophils Relative: 0 %
Eosinophils Absolute: 0.3 10*3/uL (ref 0.0–0.5)
Eosinophils Relative: 5 %
HCT: 36.3 % — ABNORMAL LOW (ref 39.0–52.0)
Hemoglobin: 11.3 g/dL — ABNORMAL LOW (ref 13.0–17.0)
Immature Granulocytes: 1 %
Lymphocytes Relative: 11 %
Lymphs Abs: 0.7 10*3/uL (ref 0.7–4.0)
MCH: 30.5 pg (ref 26.0–34.0)
MCHC: 31.1 g/dL (ref 30.0–36.0)
MCV: 97.8 fL (ref 80.0–100.0)
Monocytes Absolute: 1.4 10*3/uL — ABNORMAL HIGH (ref 0.1–1.0)
Monocytes Relative: 23 %
Neutro Abs: 3.8 10*3/uL (ref 1.7–7.7)
Neutrophils Relative %: 60 %
Platelet Count: 156 10*3/uL (ref 150–400)
RBC: 3.71 MIL/uL — ABNORMAL LOW (ref 4.22–5.81)
RDW: 16.7 % — ABNORMAL HIGH (ref 11.5–15.5)
WBC Count: 6.3 10*3/uL (ref 4.0–10.5)
nRBC: 0 % (ref 0.0–0.2)

## 2022-10-27 LAB — CMP (CANCER CENTER ONLY)
ALT: 17 U/L (ref 0–44)
AST: 11 U/L — ABNORMAL LOW (ref 15–41)
Albumin: 3.5 g/dL (ref 3.5–5.0)
Alkaline Phosphatase: 102 U/L (ref 38–126)
Anion gap: 4 — ABNORMAL LOW (ref 5–15)
BUN: 19 mg/dL (ref 8–23)
CO2: 30 mmol/L (ref 22–32)
Calcium: 9 mg/dL (ref 8.9–10.3)
Chloride: 107 mmol/L (ref 98–111)
Creatinine: 0.78 mg/dL (ref 0.61–1.24)
GFR, Estimated: >60 mL/min (ref >60)
Glucose, Bld: 90 mg/dL (ref 70–99)
Potassium: 3.7 mmol/L (ref 3.5–5.1)
Sodium: 141 mmol/L (ref 135–145)
Total Bilirubin: 0.4 mg/dL (ref 0.3–1.2)
Total Protein: 5.9 g/dL — ABNORMAL LOW (ref 6.5–8.1)

## 2022-10-27 MED ORDER — BORTEZOMIB CHEMO SQ INJECTION 3.5 MG (2.5MG/ML)
1.3000 mg/m2 | Freq: Once | INTRAMUSCULAR | Status: AC
  Administered 2022-10-27: 3 mg via SUBCUTANEOUS
  Filled 2022-10-27: qty 1.2

## 2022-10-27 NOTE — Progress Notes (Signed)
[facility] [phone]   [phone]  PROGRESS NOTE  Patient Care Team: [name], MD as PCP - General (Internal Medicine) Pricilla Riffle, MD as PCP - Cardiology (Cardiology) Quintella Reichert, MD as PCP - Sleep Medicine (Cardiology) Lanier Prude, MD as PCP - Electrophysiology (Cardiology) [name], MD as Consulting Physician (General Surgery) Vida Rigger, MD as Consulting Physician (Gastroenterology) Quintella Reichert, MD as Consulting Physician (Sleep Medicine) Glendale Chard, DO as Consulting Physician (Neurology)  Hematological/Oncological History # IgA Kappa Multiple Myeloma 05/05/2020: Bone marrow biopsy showed increased number of plasma cells representing 14% of all cells in the aspirate associated with numerous variably sized clusters in the clot and biopsy sections 04/10/2021: M protein 0.3, IgA kappa specificity. Kappa 580.5, Labda 7.0, ratio 82.93.   12/07/2021: Labs show of 1971.3, lambda 5.5, ratio 358.42 12/10/2021: Transition care to Dr. Leonides Schanz. 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  01/26/2022: Cycle 2 Day 1 of Dara/Rev/Dex 02/22/2022: Cycle 3 Day 1 of Dara/Rev/Dex 03/23/2022: Cycle 4 Day 1 of Dara/Rev/Dex 04/20/2022: Cycle 5 Day 1 of Dara/Rev/Dex 05/18/2022: Cycle 6 Day 1 of Dara/Rev/Dex 05/27/2022: MRI cervical/thoracic/lumbar: pathologic fracture at T2 with significant osseous retropulsion and resulting cord compression. Additional smaller lesions within T1 vertebral body, right C7 and T3 articulating facets.  Small Multiple Myeloma lesions scattered at all lumbar levels and in the visible left sacral ala. Largest lumbar tumor is 2.3 cm in the left L1 posterior elements and pedicle. No lumbar extraosseous or epidural tumor extension, or pathologic fracture.Underlying chronic lumbar spine  degeneration, with a new central disc herniation at L3-L4 since 2021 now resulting in mild to moderate degenerative spinal stenosis there. 05/28/2022-06/09/2022: Admitted for T2 pathologic fracture and cord compression. He received urgent radiation therapy for a total of 3.0 Gy. He underwent  thoracic 1 through 4 posterior lateral arthrodesis/fusion and 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  06/23/2022: Cycle 1 Day 1 of Velcade/Pomalyst/Dex 08/04/2022: Cycle 2 Day 1 of Velcade/Pomalyst/Dex. Pomalyst to be decreased to 2mg  PO daily due to cytopenias.  09/01/2022: Cycle 3 Day 1 of Velcade/Pomalyst/Dex. (Missed 2nd dose of cycle due to hospitalization)  10/06/2022: Cycle 4 Day 1 of Velcade/Pomalyst/Dex.  10/27/2022: Cycle 5 Day 1 of Velcade/Pomalyst/Dex.   Interval History:  JESSON FOSKEY 72 y.o. male with medical history significant for IgA Kappa Multiple Myeloma who presents for a follow up visit. He was last seen on 10/06/2022. In the interim, he continued on Velcade/Pomalyst/Dex therapy.  On exam today Mr. Bonn reports he feels that the Pomalyst is more difficult on his body than the previous treatments.  He notes that he is having thinning of his hair and easy bruising/bleeding.  He notes that he is still recovering from his spinal surgery.  He notes that he will be going on a river cruise in [place] in July.  He will be starting on July 6.  He notes he is not having any numbness or tingling of his fingers and toes.  He report he is eating well, but not as well as before.  He is not having any nausea, vomiting, or diarrhea. He denies fevers, chills, sweats, shortness of breath, chest pain or cough. He has no other complaints. Rest of the 10 point ROS is below.    Overall he is willing and able to proceed with treatment today.  MEDICAL HISTORY:  Past Medical History:  Diagnosis Date   A-fib [facility])    Arthritis    "comes and goes; mostly in hands, occasionally elbow" (04/05/2017)    Complication of anesthesia    "just wanted to sleep alot  for couple days S/P VARICOCELE OR" (04/05/2017)   DVT (deep venous thrombosis) (HCC)    LLE   Dysrhythmia    PVCs    GERD (gastroesophageal reflux disease)    History of hiatal hernia    History of radiation therapy    Thoracic Spine- 07/13/22-07/23/22-Dr. Antony Blackbird   Hypertension    Impaired glucose tolerance    Multiple myeloma (HCC)    Obesity (BMI 30-39.9) 07/26/2016   OSA on CPAP 07/26/2016   Mild with AHI 12.5/hr now on CPAP at 14cm H2O   PAF (paroxysmal atrial fibrillation) (HCC)    s/p PVI Ablation in 2018 // Flecainide Rx // Echo 8/21: EF 60-65, no RWMA, mild LVH, normal RV SF, moderate RVE, mild BAE, trivial MR, mild dilation of aortic root (40 mm)   Pneumonia    "may have had walking pneumonia a few years ago" (04/05/2017)   Pre-diabetes    Thrombophlebitis     SURGICAL HISTORY: Past Surgical History:  Procedure Laterality Date   ATRIAL FIBRILLATION ABLATION  04/05/2017   ATRIAL FIBRILLATION ABLATION N/A 04/05/2017   Procedure: ATRIAL FIBRILLATION ABLATION;  Surgeon: [name], MD;  Location: [facility];  Service: Cardiovascular;  Laterality: N/A;   BONE BIOPSY Right 12/17/2021   Procedure: BONE BIOPSY;  Surgeon: [name], MD;  Location: [facility];  Service: Orthopedics;  Laterality: Right;   COMPLETE RIGHT HIP REPLACMENT  01/19/2019   EVALUATION UNDER ANESTHESIA WITH HEMORRHOIDECTOMY N/A 02/24/2017   Procedure: EXAM UNDER ANESTHESIA WITH  HEMORRHOIDECTOMY;  Surgeon: [name], MD;  Location: [facility];  Service: General;  Laterality: N/A;   EXCISIONAL TOTAL HIP ARTHROPLASTY WITH ANTIBIOTIC SPACERS Right 09/25/2020   Procedure: EXCISIONAL TOTAL HIP ARTHROPLASTY WITH ANTIBIOTIC SPACERS;  Surgeon: [name], MD;  Location: [facility];  Service: Orthopedics;  Laterality: Right;   FLEXIBLE SIGMOIDOSCOPY N/A 04/15/2015   Procedure:  UNSEDATED FLEXIBLE SIGMOIDOSCOPY;  Surgeon:  [name], MD;  Location: [facility];  Service: Endoscopy;  Laterality: N/A;   HUMERUS IM NAIL Right 12/17/2021   Procedure: INTRAMEDULLARY (IM) NAIL HUMERAL;  Surgeon: [name], MD;  Location: [facility];  Service: Orthopedics;  Laterality: Right;   KNEE ARTHROSCOPY Left ~ 2016   POSTERIOR CERVICAL FUSION/FORAMINOTOMY N/A 06/04/2022   Procedure: Thoracic one - Thoracic Four Posterior lateral arthrodesis/ fusion and Thoracic two Corpectomy;  Surgeon: [name], MD;  Location: [facility];  Service: Neurosurgery;  Laterality: N/A;   REIMPLANTATION OF TOTAL HIP Right 12/25/2020   Procedure: REIMPLANTATION/REVISION OF RIGHT TOTAL HIP VERSUS REPEAT IRRIGATION AND DEBRIDEMENT;  Surgeon: [name], MD;  Location: [facility];  Service: Orthopedics;  Laterality: Right;   TESTICLE SURGERY  1988   VARICOCELE   TONSILLECTOMY     VARICOSE VEIN SURGERY Bilateral     SOCIAL HISTORY: Social History   Socioeconomic History   Marital status: Married    Spouse name: Not on file   Number of children: 1   Years of education: law school   Highest education level: Not on file  Occupational History   Occupation: lawyer  Tobacco Use   Smoking status: Never   Smokeless tobacco: Never  Vaping Use   Vaping Use: Never used  Substance and Sexual Activity  Alcohol use: Yes    Alcohol/week: 2.0 standard drinks of alcohol    Types: 2 Glasses of wine per week    Comment: 2 glasses of wine a week, occ bourbon, beer   Drug use: No   Sexual activity: Not Currently  Other Topics Concern   Not on file  Social History Narrative   Lives in [place] with spouse in a 2 story home.  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 1 daughter.     Education: Social worker school.    Social Determinants of Health   Financial Resource Strain: Not on file  Food Insecurity: No Food Insecurity (09/27/2022)   Hunger Vital Sign    Worried About Running Out of Food in the Last Year: Never  true    Ran Out of Food in the Last Year: Never true  Transportation Needs: No Transportation Needs (09/27/2022)   PRAPARE - Administrator, Civil Service (Medical): No    Lack of Transportation (Non-Medical): No  Physical Activity: Not on file  Stress: Not on file  Social Connections: Not on file  Intimate Partner Violence: Not At Risk (09/27/2022)   Humiliation, Afraid, Rape, and Kick questionnaire    Fear of Current or Ex-Partner: No    Emotionally Abused: No    Physically Abused: No    Sexually Abused: No    FAMILY HISTORY: Family History  Problem Relation Age of Onset   Dementia Mother    Stroke Father    Atrial fibrillation Father    Hypertension Father    Hypertension Paternal Grandfather    Dementia Maternal Grandmother     ALLERGIES:  is allergic to linezolid and vancomycin.  MEDICATIONS:  Current Outpatient Medications  Medication Sig Dispense Refill   acyclovir (ZOVIRAX) 400 MG tablet Take 1 tablet (400 mg total) by mouth 2 (two) times daily. 180 tablet 3   albuterol (VENTOLIN HFA) 108 (90 Base) MCG/ACT inhaler Inhale 2 puffs into the lungs every 6 (six) hours as needed for wheezing or shortness of breath. 8 g 0   amiodarone (PACERONE) 200 MG tablet Take 1 tablet (200 mg total) by mouth daily. Start after finishing 40 mg twice daily dose of  days (Patient taking differently: Take 200 mg by mouth daily.) 180 tablet 1   apixaban (ELIQUIS) 5 MG TABS tablet Take 1 tablet (5 mg total) by mouth 2 (two) times daily. 60 tablet 11   cholecalciferol (VITAMIN D3) 25 MCG (1000 UNIT) tablet Take 1,000 Units by mouth daily.     Cyanocobalamin (VITAMIN B-12 PO) Take 1,000 mcg by mouth daily.     dexamethasone (DECADRON) 4 MG tablet Take 10 tablets (40 mg total) by mouth every 7 (seven) days. Take 40 mg by mouth once a day in the morning of chemo on Wednesdays- or unless otherwise instructed 40 tablet 2   fluticasone (FLONASE) 50 MCG/ACT nasal spray Place 1 spray into both  nostrils daily as needed for allergies or rhinitis.     furosemide (LASIX) 20 MG tablet Take 3 tablets (60 mg total) by mouth daily. If your weight increases by more than 2 pounds compared to the day prior or 5 pounds over the past week take an additional dose that day. 90 tablet 2   gabapentin (NEURONTIN) 300 MG capsule TAKE TWO CAPSULES BY MOUTH TWICE A DAY (Patient taking differently: Take 600 mg by mouth 2 (two) times daily.) 360 capsule 1   hydrALAZINE (APRESOLINE) 50 MG tablet  Take 1.5 tablets (75 mg total) by mouth 3 (three) times daily. 135 tablet 3   irbesartan (AVAPRO) 150 MG tablet Take 1 tablet (150 mg total) by mouth daily. 30 tablet 5   metolazone (ZAROXOLYN) 2.5 MG tablet Take 1 tablet (2.5 mg total) by mouth every other day. 45 tablet 3   Omega-3 Fatty Acids (FISH OIL PO) Take 1 Capful by mouth daily.     pomalidomide (POMALYST) 2 MG capsule Take 1 capsule (2 mg total) by mouth daily. Take one tablet (2 mg by mouth) daily for 21 days, then take 7 days off [employer] auth #[id number]  Date obtained 09/17/22 21 capsule 0   potassium chloride SA (KLOR-CON M) 20 MEQ tablet Take 2 tablets (40 mEq total) by mouth daily. Take an extra tablet on the day you take the Metolazone (60 meq total) (Patient taking differently: Take 40 mEq by mouth 2 (two) times daily.) 180 tablet 3   sodium chloride (OCEAN) 0.65 % SOLN nasal spray Place 1 spray into both nostrils as needed for congestion.     No current facility-administered medications for this visit.    REVIEW OF SYSTEMS:   Constitutional: ( - ) fevers, ( - )  chills , ( - ) night sweats Eyes: ( - ) blurriness of vision, ( - ) double vision, ( - ) watery eyes Ears, nose, mouth, throat, and face: ( - ) mucositis, ( - ) sore throat Respiratory: ( - ) cough, ( - ) dyspnea, ( - ) wheezes Cardiovascular: ( - ) palpitation, ( - ) chest discomfort, ( - ) lower extremity swelling Gastrointestinal:  ( - ) nausea, ( - ) heartburn, ( - ) change in bowel  habits Skin: ( - ) abnormal skin rashes Lymphatics: ( - ) new lymphadenopathy, ( - ) easy bruising Neurological: ( - ) numbness, ( - ) tingling, ( - ) new weaknesses Behavioral/Psych: ( - ) mood change, ( - ) new changes  All other systems were reviewed with the patient and are negative.  PHYSICAL EXAMINATION: ECOG PERFORMANCE STATUS: 2 - Symptomatic, <50% confined to bed  Vitals:   10/27/22 1021  BP: 117/78  Pulse: 72  Resp: 14  Temp: 97.8 F (36.6 C)  SpO2: 96%       Filed Weights   10/27/22 1021  Weight: 248 lb 1.6 oz (112.5 kg)        GENERAL: Well-appearing elderly Caucasian male, alert, no distress and comfortable SKIN: no overt abnormalities of the skin.  EYES: conjunctiva are pink and non-injected, sclera clear LUNGS: clear to auscultation and percussion with normal breathing effort HEART: tachycardic &  no murmurs. +bilateral lower extremity edema, left greater than right Musculoskeletal: no cyanosis of digits and no clubbing  PSYCH: alert & oriented x 3, fluent speech NEURO: no focal motor/sensory deficits  LABORATORY DATA:  I have reviewed the data as listed    Latest Ref Rng & Units 10/27/2022    9:47 AM 10/25/2022    5:40 AM 10/20/2022   11:06 AM  CBC  WBC 4.0 - 10.5 K/uL 6.3  10.5  8.4   Hemoglobin 13.0 - 17.0 g/dL 47.8  29.5  62.1   Hematocrit 39.0 - 52.0 % 36.3  41.6  34.3   Platelets 150 - 400 K/uL 156  123  161        Latest Ref Rng & Units 10/27/2022    9:47 AM 10/25/2022    5:40 AM 10/20/2022   11:06 AM  CMP  Glucose 70 - 99 mg/dL 90  96  098   BUN 8 - 23 mg/dL 19  22  13    Creatinine 0.61 - 1.24 mg/dL 1.19  1.47  8.29   Sodium 135 - 145 mmol/L 141  139  141   Potassium 3.5 - 5.1 mmol/L 3.7  3.0  3.9   Chloride 98 - 111 mmol/L 107  100  108   CO2 22 - 32 mmol/L 30  26  28    Calcium 8.9 - 10.3 mg/dL 9.0  8.6  8.8   Total Protein 6.5 - 8.1 g/dL 5.9  6.1  5.6   Total Bilirubin 0.3 - 1.2 mg/dL 0.4  0.8  0.5   Alkaline Phos 38 - 126 U/L  102  84  103   AST 15 - 41 U/L 11  17  17    ALT 0 - 44 U/L 17  25  22      Lab Results  Component Value Date   MPROTEIN Not Observed 10/06/2022   MPROTEIN Not Observed 09/01/2022   MPROTEIN Not Observed 08/04/2022   Lab Results  Component Value Date   KPAFRELGTCHN 49.0 (H) 10/06/2022   KPAFRELGTCHN 24.2 (H) 09/01/2022   KPAFRELGTCHN 16.1 08/04/2022   LAMBDASER 12.0 10/06/2022   LAMBDASER 5.8 09/01/2022   LAMBDASER 3.8 (L) 08/04/2022   KAPLAMBRATIO 4.08 (H) 10/06/2022   KAPLAMBRATIO 4.17 (H) 09/01/2022   KAPLAMBRATIO 4.24 (H) 08/04/2022     RADIOGRAPHIC STUDIES: MR CERVICAL SPINE W WO CONTRAST  Result Date: 10/25/2022 CLINICAL DATA:  Worsening neck and mid back pain. History of multiple myeloma. EXAM: MRI CERVICAL, THORACIC AND LUMBAR SPINE WITHOUT AND WITH CONTRAST TECHNIQUE: Multiplanar and multiecho pulse sequences of the cervical spine, to include the craniocervical junction and cervicothoracic junction, and thoracic and lumbar spine, were obtained without and with intravenous contrast. CONTRAST:  10mL GADAVIST GADOBUTROL 1 MMOL/ML IV SOLN COMPARISON:  CT chest dated Sep 29, 2022. CT neck dated August 02, 2022. MRI thoracic and lumbar spine dated May 29, 2022. MRI cervical spine dated May 27, 2022. FINDINGS: MRI CERVICAL SPINE FINDINGS Alignment: Straightening of the normal cervical lordosis. Vertebrae: Unchanged small enhancing lesion within the right C7 inferior articular process. No acute fracture or evidence of discitis. Cord: Normal signal and morphology. Posterior Fossa, vertebral arteries, paraspinal tissues: Rim enhancing midline partially loculated fluid collection extending from C7-T4, not significantly changed since April 1. Disc levels: C2-C3: Negative disc. Unchanged severe left and moderate right facet arthropathy. Unchanged moderate left neuroforaminal stenosis. No spinal canal or right neuroforaminal stenosis. C3-C4: Negative disc. Unchanged facet joint ankylosis.  Unchanged moderate left neuroforaminal stenosis. No spinal canal or right neuroforaminal stenosis. C4-C5: Unchanged mild disc bulging with moderate right-greater-than-left facet uncovertebral hypertrophy. Unchanged mild spinal canal stenosis with severe right and moderate left neuroforaminal stenosis. C5-C6: Unchanged posterior disc osteophyte complex, uncovertebral ankylosis, and mild bilateral facet arthropathy. Unchanged mild spinal canal and mild-to-moderate bilateral neuroforaminal stenosis. C6-C7: Unchanged small posterior disc osteophyte complex and moderate bilateral facet arthropathy. Unchanged mild bilateral neuroforaminal stenosis. No spinal canal stenosis. C7-T1: Negative disc. Unchanged bilateral facet arthropathy. No stenosis. MRI THORACIC SPINE FINDINGS Alignment:  Mildly exaggerated upper thoracic kyphosis is unchanged. Vertebrae: Postsurgical changes and susceptibility artifact related to prior T2 corpectomy and T1-T4 posterior fusion. Abnormal marrow signal within the residual T2 vertebral body consistent with residual tumor. Residual left-sided bony retropulsion with flattening of the left cord and mild left-sided spinal canal stenosis. The spinal canal otherwise appears patent at this  level based on the sagittal cervical spine sequences. Small enhancing lesions identified within the T6, T8, and T10 vertebral bodies, as well as the T11 spinous process, not significantly changed. No acute fracture or evidence of discitis. Cord:  Normal signal. Paraspinal and other soft tissues: Unchanged bone lesions in the posterior right sixth and eighth ribs. Trace bilateral pleural effusions. Disc levels: Tiny T7-T8 and small T8-T9 central disc protrusions. No degenerative spinal canal or neuroforaminal stenosis. MRI LUMBAR SPINE FINDINGS Segmentation:  Standard. Alignment:  Physiologic. Vertebrae: Unchanged enhancing lesions in the left L1 and L2 pedicles, L2, L3, L4, and L5 vertebral bodies, sacrum, and  posterior iliac bones. No definite new lesion. No acute fracture or evidence of discitis. Conus medullaris and cauda equina: Conus extends to the L1 level. Conus and cauda equina appear normal. Paraspinal and other soft tissues: Negative. Disc levels: T12-L1:  Negative. L1-L2:  Negative. L2-L3: Unchanged mild disc bulging and bilateral facet arthropathy. No stenosis. L3-L4: Unchanged mild-to-moderate disc bulging. Interval enlargement of the central disc herniation with new extruded components migrating superiorly and inferiorly. Unchanged mild bilateral facet arthropathy. Progressive moderate spinal canal stenosis. No neuroforaminal stenosis. L4-L5: Unchanged mild disc bulging and bilateral facet arthropathy. Unchanged mild right-greater-than-left lateral recess stenosis. No spinal canal or neuroforaminal stenosis. L5-S1: Unchanged mild disc bulging with superimposed small right paracentral disc protrusion and bulky left foraminal disc osteophyte complex. Unchanged mild bilateral facet arthropathy. Unchanged mild left neuroforaminal stenosis. No spinal canal or right neuroforaminal stenosis. IMPRESSION: CERVICAL SPINE: 1. Unchanged small enhancing lesion within the right C7 inferior articular process. No new osseous lesion or pathologic fracture. 2. Unchanged multilevel cervical spondylosis as described above. THORACIC SPINE: 1. Postsurgical changes related to prior T2 corpectomy and T1-T4 posterior fusion. Mild residual left-sided bony retropulsion at T2 flattens the left ventral cord without cord signal abnormality or high-grade stenosis. 2. Unchanged small enhancing lesions within the T6, T8, and T10 vertebral bodies, as well as the T11 spinous process. No new osseous lesion or pathologic fracture. 3. Posterior midline partially loculated fluid collection extending from C7-T4, not significantly changed since April 1, likely a seroma. 4. Trace bilateral pleural effusions. LUMBAR SPINE: 1. Unchanged multiple  enhancing lesions within the lumbar spine, sacrum, and posterior iliac bones. No new osseous lesion or pathologic fraction. 2. Interval enlargement of the central disc herniation at L3-L4 with new extruded components migrating superiorly and inferiorly. Progressive moderate spinal canal stenosis. Electronically Signed   By: Obie Dredge M.D.   On: 10/25/2022 09:32   MR THORACIC SPINE W WO CONTRAST  Result Date: 10/25/2022 CLINICAL DATA:  Worsening neck and mid back pain. History of multiple myeloma. EXAM: MRI CERVICAL, THORACIC AND LUMBAR SPINE WITHOUT AND WITH CONTRAST TECHNIQUE: Multiplanar and multiecho pulse sequences of the cervical spine, to include the craniocervical junction and cervicothoracic junction, and thoracic and lumbar spine, were obtained without and with intravenous contrast. CONTRAST:  10mL GADAVIST GADOBUTROL 1 MMOL/ML IV SOLN COMPARISON:  CT chest dated Sep 29, 2022. CT neck dated August 02, 2022. MRI thoracic and lumbar spine dated May 29, 2022. MRI cervical spine dated May 27, 2022. FINDINGS: MRI CERVICAL SPINE FINDINGS Alignment: Straightening of the normal cervical lordosis. Vertebrae: Unchanged small enhancing lesion within the right C7 inferior articular process. No acute fracture or evidence of discitis. Cord: Normal signal and morphology. Posterior Fossa, vertebral arteries, paraspinal tissues: Rim enhancing midline partially loculated fluid collection extending from C7-T4, not significantly changed since April 1. Disc levels: C2-C3: Negative disc. Unchanged severe  left and moderate right facet arthropathy. Unchanged moderate left neuroforaminal stenosis. No spinal canal or right neuroforaminal stenosis. C3-C4: Negative disc. Unchanged facet joint ankylosis. Unchanged moderate left neuroforaminal stenosis. No spinal canal or right neuroforaminal stenosis. C4-C5: Unchanged mild disc bulging with moderate right-greater-than-left facet uncovertebral hypertrophy. Unchanged mild  spinal canal stenosis with severe right and moderate left neuroforaminal stenosis. C5-C6: Unchanged posterior disc osteophyte complex, uncovertebral ankylosis, and mild bilateral facet arthropathy. Unchanged mild spinal canal and mild-to-moderate bilateral neuroforaminal stenosis. C6-C7: Unchanged small posterior disc osteophyte complex and moderate bilateral facet arthropathy. Unchanged mild bilateral neuroforaminal stenosis. No spinal canal stenosis. C7-T1: Negative disc. Unchanged bilateral facet arthropathy. No stenosis. MRI THORACIC SPINE FINDINGS Alignment:  Mildly exaggerated upper thoracic kyphosis is unchanged. Vertebrae: Postsurgical changes and susceptibility artifact related to prior T2 corpectomy and T1-T4 posterior fusion. Abnormal marrow signal within the residual T2 vertebral body consistent with residual tumor. Residual left-sided bony retropulsion with flattening of the left cord and mild left-sided spinal canal stenosis. The spinal canal otherwise appears patent at this level based on the sagittal cervical spine sequences. Small enhancing lesions identified within the T6, T8, and T10 vertebral bodies, as well as the T11 spinous process, not significantly changed. No acute fracture or evidence of discitis. Cord:  Normal signal. Paraspinal and other soft tissues: Unchanged bone lesions in the posterior right sixth and eighth ribs. Trace bilateral pleural effusions. Disc levels: Tiny T7-T8 and small T8-T9 central disc protrusions. No degenerative spinal canal or neuroforaminal stenosis. MRI LUMBAR SPINE FINDINGS Segmentation:  Standard. Alignment:  Physiologic. Vertebrae: Unchanged enhancing lesions in the left L1 and L2 pedicles, L2, L3, L4, and L5 vertebral bodies, sacrum, and posterior iliac bones. No definite new lesion. No acute fracture or evidence of discitis. Conus medullaris and cauda equina: Conus extends to the L1 level. Conus and cauda equina appear normal. Paraspinal and other soft  tissues: Negative. Disc levels: T12-L1:  Negative. L1-L2:  Negative. L2-L3: Unchanged mild disc bulging and bilateral facet arthropathy. No stenosis. L3-L4: Unchanged mild-to-moderate disc bulging. Interval enlargement of the central disc herniation with new extruded components migrating superiorly and inferiorly. Unchanged mild bilateral facet arthropathy. Progressive moderate spinal canal stenosis. No neuroforaminal stenosis. L4-L5: Unchanged mild disc bulging and bilateral facet arthropathy. Unchanged mild right-greater-than-left lateral recess stenosis. No spinal canal or neuroforaminal stenosis. L5-S1: Unchanged mild disc bulging with superimposed small right paracentral disc protrusion and bulky left foraminal disc osteophyte complex. Unchanged mild bilateral facet arthropathy. Unchanged mild left neuroforaminal stenosis. No spinal canal or right neuroforaminal stenosis. IMPRESSION: CERVICAL SPINE: 1. Unchanged small enhancing lesion within the right C7 inferior articular process. No new osseous lesion or pathologic fracture. 2. Unchanged multilevel cervical spondylosis as described above. THORACIC SPINE: 1. Postsurgical changes related to prior T2 corpectomy and T1-T4 posterior fusion. Mild residual left-sided bony retropulsion at T2 flattens the left ventral cord without cord signal abnormality or high-grade stenosis. 2. Unchanged small enhancing lesions within the T6, T8, and T10 vertebral bodies, as well as the T11 spinous process. No new osseous lesion or pathologic fracture. 3. Posterior midline partially loculated fluid collection extending from C7-T4, not significantly changed since April 1, likely a seroma. 4. Trace bilateral pleural effusions. LUMBAR SPINE: 1. Unchanged multiple enhancing lesions within the lumbar spine, sacrum, and posterior iliac bones. No new osseous lesion or pathologic fraction. 2. Interval enlargement of the central disc herniation at L3-L4 with new extruded components  migrating superiorly and inferiorly. Progressive moderate spinal canal stenosis. Electronically Signed   By:  Obie Dredge M.D.   On: 10/25/2022 09:32   MR Lumbar Spine W Wo Contrast  Result Date: 10/25/2022 CLINICAL DATA:  Worsening neck and mid back pain. History of multiple myeloma. EXAM: MRI CERVICAL, THORACIC AND LUMBAR SPINE WITHOUT AND WITH CONTRAST TECHNIQUE: Multiplanar and multiecho pulse sequences of the cervical spine, to include the craniocervical junction and cervicothoracic junction, and thoracic and lumbar spine, were obtained without and with intravenous contrast. CONTRAST:  10mL GADAVIST GADOBUTROL 1 MMOL/ML IV SOLN COMPARISON:  CT chest dated Sep 29, 2022. CT neck dated August 02, 2022. MRI thoracic and lumbar spine dated May 29, 2022. MRI cervical spine dated May 27, 2022. FINDINGS: MRI CERVICAL SPINE FINDINGS Alignment: Straightening of the normal cervical lordosis. Vertebrae: Unchanged small enhancing lesion within the right C7 inferior articular process. No acute fracture or evidence of discitis. Cord: Normal signal and morphology. Posterior Fossa, vertebral arteries, paraspinal tissues: Rim enhancing midline partially loculated fluid collection extending from C7-T4, not significantly changed since April 1. Disc levels: C2-C3: Negative disc. Unchanged severe left and moderate right facet arthropathy. Unchanged moderate left neuroforaminal stenosis. No spinal canal or right neuroforaminal stenosis. C3-C4: Negative disc. Unchanged facet joint ankylosis. Unchanged moderate left neuroforaminal stenosis. No spinal canal or right neuroforaminal stenosis. C4-C5: Unchanged mild disc bulging with moderate right-greater-than-left facet uncovertebral hypertrophy. Unchanged mild spinal canal stenosis with severe right and moderate left neuroforaminal stenosis. C5-C6: Unchanged posterior disc osteophyte complex, uncovertebral ankylosis, and mild bilateral facet arthropathy. Unchanged mild  spinal canal and mild-to-moderate bilateral neuroforaminal stenosis. C6-C7: Unchanged small posterior disc osteophyte complex and moderate bilateral facet arthropathy. Unchanged mild bilateral neuroforaminal stenosis. No spinal canal stenosis. C7-T1: Negative disc. Unchanged bilateral facet arthropathy. No stenosis. MRI THORACIC SPINE FINDINGS Alignment:  Mildly exaggerated upper thoracic kyphosis is unchanged. Vertebrae: Postsurgical changes and susceptibility artifact related to prior T2 corpectomy and T1-T4 posterior fusion. Abnormal marrow signal within the residual T2 vertebral body consistent with residual tumor. Residual left-sided bony retropulsion with flattening of the left cord and mild left-sided spinal canal stenosis. The spinal canal otherwise appears patent at this level based on the sagittal cervical spine sequences. Small enhancing lesions identified within the T6, T8, and T10 vertebral bodies, as well as the T11 spinous process, not significantly changed. No acute fracture or evidence of discitis. Cord:  Normal signal. Paraspinal and other soft tissues: Unchanged bone lesions in the posterior right sixth and eighth ribs. Trace bilateral pleural effusions. Disc levels: Tiny T7-T8 and small T8-T9 central disc protrusions. No degenerative spinal canal or neuroforaminal stenosis. MRI LUMBAR SPINE FINDINGS Segmentation:  Standard. Alignment:  Physiologic. Vertebrae: Unchanged enhancing lesions in the left L1 and L2 pedicles, L2, L3, L4, and L5 vertebral bodies, sacrum, and posterior iliac bones. No definite new lesion. No acute fracture or evidence of discitis. Conus medullaris and cauda equina: Conus extends to the L1 level. Conus and cauda equina appear normal. Paraspinal and other soft tissues: Negative. Disc levels: T12-L1:  Negative. L1-L2:  Negative. L2-L3: Unchanged mild disc bulging and bilateral facet arthropathy. No stenosis. L3-L4: Unchanged mild-to-moderate disc bulging. Interval enlargement  of the central disc herniation with new extruded components migrating superiorly and inferiorly. Unchanged mild bilateral facet arthropathy. Progressive moderate spinal canal stenosis. No neuroforaminal stenosis. L4-L5: Unchanged mild disc bulging and bilateral facet arthropathy. Unchanged mild right-greater-than-left lateral recess stenosis. No spinal canal or neuroforaminal stenosis. L5-S1: Unchanged mild disc bulging with superimposed small right paracentral disc protrusion and bulky left foraminal disc osteophyte complex. Unchanged mild bilateral facet  arthropathy. Unchanged mild left neuroforaminal stenosis. No spinal canal or right neuroforaminal stenosis. IMPRESSION: CERVICAL SPINE: 1. Unchanged small enhancing lesion within the right C7 inferior articular process. No new osseous lesion or pathologic fracture. 2. Unchanged multilevel cervical spondylosis as described above. THORACIC SPINE: 1. Postsurgical changes related to prior T2 corpectomy and T1-T4 posterior fusion. Mild residual left-sided bony retropulsion at T2 flattens the left ventral cord without cord signal abnormality or high-grade stenosis. 2. Unchanged small enhancing lesions within the T6, T8, and T10 vertebral bodies, as well as the T11 spinous process. No new osseous lesion or pathologic fracture. 3. Posterior midline partially loculated fluid collection extending from C7-T4, not significantly changed since April 1, likely a seroma. 4. Trace bilateral pleural effusions. LUMBAR SPINE: 1. Unchanged multiple enhancing lesions within the lumbar spine, sacrum, and posterior iliac bones. No new osseous lesion or pathologic fraction. 2. Interval enlargement of the central disc herniation at L3-L4 with new extruded components migrating superiorly and inferiorly. Progressive moderate spinal canal stenosis. Electronically Signed   By: Obie Dredge M.D.   On: 10/25/2022 09:32   CT ABDOMEN PELVIS W CONTRAST  Result Date: 10/25/2022 CLINICAL DATA:   Abdominal pain, acute, nonlocalized. EXAM: CT ABDOMEN AND PELVIS WITH CONTRAST TECHNIQUE: Multidetector CT imaging of the abdomen and pelvis was performed using the standard protocol following bolus administration of intravenous contrast. RADIATION DOSE REDUCTION: This exam was performed according to the departmental dose-optimization program which includes automated exposure control, adjustment of the mA and/or kV according to patient size and/or use of iterative reconstruction technique. CONTRAST:  OMNIPAQUE IOHEXOL 300 MG/ML  SOLN COMPARISON:  05/14/2015 and chest CT 09/29/2022 FINDINGS: Lower chest: Dependent densities at both lung bases. Trace right pleural fluid. Hepatobiliary: Again noted is a small cluster of cysts in the inferior right hepatic lobe, largest measures up to 2.9 cm. No suspicious hepatic lesions. Main portal venous system is patent. No biliary dilatation. Normal appearance of the gallbladder. Pancreas: Again noted are at least 2 cystic lesions involving the pancreatic tail that were present on the CT of the chest from 09/29/2022 and 03/15/2022 but new since 2017. The largest pancreatic cystic lesion measures 3.0 x 2.1 x 2.4 cm on image 29/7. Difficult to exclude interval enlargement since 2023 because this area was incompletely evaluated 2023. No evidence for pancreatic dilatation or inflammation. Spleen: Spleen size is normal.  No suspicious splenic lesions. Adrenals/Urinary Tract: Normal adrenal glands. Two stones in the left kidney, largest measures approximately 4 mm in the upper pole region. Small low-density structures in both kidneys probably represent cysts but too small to definitively characterize. Multiple small nonobstructive right renal calculi. No hydronephrosis. No ureter stones. Normal appearance of the urinary bladder. Stomach/Bowel: Normal appearance of the stomach and duodenum. No bowel dilatation or obstruction. Scattered colonic diverticula without acute bowel  inflammation. Normal appendix. Vascular/Lymphatic: Aortic atherosclerosis. No enlarged abdominal or pelvic lymph nodes. Reproductive: Prostate is unremarkable. Other: Bilateral inguinal hernias containing fat, right side greater than left. Negative for free fluid in the abdomen or pelvis. Negative for free air. Small umbilical hernia containing fat. Musculoskeletal: Numerous small lucent lesions throughout the bones particularly in the pelvis. Right hip arthroplasty is located. Large area of lucency involving the right superior acetabular region. These findings are compatible with history of myeloma. Vertebral body heights in the lumbar spine and lower thoracic spine are maintained. Disc space narrowing in the lower lumbar spine particularly at L4-L5 and posterior aspect of L5-S1. IMPRESSION: 1. No acute abnormality  in the abdomen or pelvis. 2. Bilateral nonobstructive renal calculi. 3. Cystic lesions involving the pancreatic tail. Largest measures up to 3 cm. These are new since 2017 but present since 03/15/2022. These could represent cystic neoplasms. Recommend further characterization with a MRI of the abdomen with and without contrast. 4. Numerous lucent lesions throughout the bones compatible with history of myeloma. No evidence for a pathologic fracture. 5. Bilateral inguinal hernias containing fat. 6. Trace right pleural fluid. 7. Aortic Atherosclerosis (ICD10-I70.0). Electronically Signed   By: Richarda Overlie M.D.   On: 10/25/2022 09:01   CT L-SPINE NO CHARGE  Result Date: 10/25/2022 CLINICAL DATA:  Fall, leg weakness.  History of multiple myeloma EXAM: CT LUMBAR SPINE WITHOUT CONTRAST TECHNIQUE: Multidetector CT imaging of the lumbar spine was performed without intravenous contrast administration. Multiplanar CT image reconstructions were also generated. RADIATION DOSE REDUCTION: This exam was performed according to the departmental dose-optimization program which includes automated exposure control,  adjustment of the mA and/or kV according to patient size and/or use of iterative reconstruction technique. COMPARISON:  MRI 05/29/2022 FINDINGS: Segmentation: 5 lumbar type vertebrae. Alignment: Mild levocurvature.  No significant listhesis. Vertebrae: Innumerable lytic lesions throughout the lumbar vertebral bodies as well as throughout the sacrum and visualized posterior pelvis with dominant lesions centered in the left L1 and L2 pedicles as well as a lesion within the posterior right iliac bone. Overall distribution is similar to the previous MRI. No pathologic fracture is identified. No definite extraosseous soft tissue tumor. Paraspinal and other soft tissues: Aortic atherosclerosis. No acute findings. Disc levels: Mild-to-moderate degenerative disc disease within the lower lumbar spine most pronounced at L4-L5. Please see recently obtained lumbar spine MRI for full level by level detail. IMPRESSION: Innumerable lytic lesions throughout the lumbar vertebral bodies as well as throughout the sacrum and visualized posterior pelvis with dominant lesions centered in the left L1 and L2 pedicles as well as a lesion within the posterior right iliac bone. Overall distribution is similar to the previous MRI and compatible with known multiple myeloma. No pathologic fracture is identified. Aortic Atherosclerosis (ICD10-I70.0). Electronically Signed   By: Duanne Guess D.O.   On: 10/25/2022 08:44   CT Thoracic Spine Wo Contrast  Result Date: 10/25/2022 CLINICAL DATA:  Weakness.  History of multiple myeloma EXAM: CT THORACIC SPINE WITHOUT CONTRAST TECHNIQUE: Multidetector CT images of the thoracic were obtained using the standard protocol without intravenous contrast. RADIATION DOSE REDUCTION: This exam was performed according to the departmental dose-optimization program which includes automated exposure control, adjustment of the mA and/or kV according to patient size and/or use of iterative reconstruction  technique. COMPARISON:  CT 06/29/2022.  MRI 10/23/2022, chest CT 03/15/2022 FINDINGS: Alignment: Mildly exaggerated thoracic kyphosis. No static listhesis. Vertebrae: Postsurgical changes of T1-T4 fusion and posterior decompression with T2 corpectomy. Slight anterior positioning of the corpectomy spacer is unchanged from prior. No perihardware lucency. Innumerable lytic lesions of varying size throughout the thoracic vertebral bodies and involving the posterior elements of multiple levels. Subtle chronic pathologic fracture involving the inferior endplate of T10, unchanged in appearance from the chest CT of 03/15/2022. No new pathologic fracture. Mildly expansile lytic lesion within the posterior right sixth rib. Flowing anterior endplate osteophytes spanning from T3-T11 compatible with diffuse idiopathic skeletal hyperostosis. Paraspinal and other soft tissues: Trace right pleural effusion. Dependent bibasilar atelectasis. No acute findings. Disc levels: Intervertebral disc heights are preserved. No significant facet arthropathy. IMPRESSION: 1. Postsurgical changes of T1-T4 fusion and posterior decompression with T2 corpectomy. No evidence  of hardware complication. 2. Innumerable lytic lesions of varying size throughout the thoracic spine compatible with known multiple myeloma. No new pathologic fracture. 3. Trace right pleural effusion. Electronically Signed   By: Duanne Guess D.O.   On: 10/25/2022 08:39   MR BRAIN W WO CONTRAST  Result Date: 10/14/2022 CLINICAL DATA:  Diplopia. EXAM: MRI HEAD WITHOUT AND WITH CONTRAST TECHNIQUE: Multiplanar, multiecho pulse sequences of the brain and surrounding structures were obtained without and with intravenous contrast. COMPARISON:  Head CT October 10, 2016. FINDINGS: Brain: No acute infarction, hemorrhage, hydrocephalus, extra-axial collection or intracranial mass lesion. Scattered and confluent foci of T2 hyperintensity are seen within the white matter of the cerebral  hemispheres, nonspecific. Mild parenchymal volume loss. Hemosiderin staining is seen along the cerebellar sulci and on the surface of the brainstem, particularly in the medulla, consistent with superficial siderosis. No intraparenchymal focus of abnormal contrast enhancement identified. Vascular: Normal flow voids. Skull and upper cervical spine: Rim enhancing lesions are seen in the left parietal and occipital bones (series 23, images 89 and 163). This has developed since prior CT. Sinuses/Orbits: Mucosal thickening of the bilateral ethmoid cells. Bilateral mastoid effusion. The orbits are maintained. Other: None. IMPRESSION: 1. No acute intracranial abnormality. 2. Moderate chronic microvascular ischemic changes of the white matter. 3. Hemosiderin staining along the cerebellar sulci and on the surface of the brainstem, consistent with superficial siderosis. 4. Rim enhancing lesions in the left parietal and occipital bones, concerning for multiple myeloma versus osseous metastatic disease. Electronically Signed   By: Baldemar Lenis M.D.   On: 10/14/2022 11:15   DG Chest 1 View  Result Date: 10/03/2022 CLINICAL DATA:  72 year old male with history of pneumonia. EXAM: CHEST  1 VIEW COMPARISON:  Chest x-ray 09/28/2022. FINDINGS: Diffuse interstitial prominence, widespread peribronchial cuffing and patchy ill-defined opacities are noted throughout the lungs bilaterally, most confluent in the right upper lobe where the extent of airspace consolidation appears slightly increased compared to the recent prior study. No definite pleural effusions. No pneumothorax. No cephalization of the pulmonary vasculature. Heart size is normal. The patient is rotated to the left on today's exam, resulting in distortion of the mediastinal contours and reduced diagnostic sensitivity and specificity for mediastinal pathology. Orthopedic fixation hardware throughout the cervical spine. IMPRESSION: 1. Severe multilobar  bilateral bronchopneumonia, most confluent in the right upper lobe, which appears progressive compared to the prior study, as above. Electronically Signed   By: Trudie Reed M.D.   On: 10/03/2022 08:38   CT CHEST W CONTRAST  Result Date: 09/30/2022 CLINICAL DATA:  Pneumonia with complications suspected and shortness of breath. Flu positive. EXAM: CT CHEST WITH CONTRAST TECHNIQUE: Multidetector CT imaging of the chest was performed during intravenous contrast administration. RADIATION DOSE REDUCTION: This exam was performed according to the departmental dose-optimization program which includes automated exposure control, adjustment of the mA and/or kV according to patient size and/or use of iterative reconstruction technique. CONTRAST:  75mL OMNIPAQUE IOHEXOL 300 MG/ML  SOLN COMPARISON:  Portable chest 09/28/2022, AP Lat chest 09/26/2022, CTA chest 06/29/2022, CTA chest 03/15/2022. FINDINGS: Cardiovascular: The heart is upper limit of normal in size, smaller than on the last CTA. No pericardial effusion is seen. Chronic prominence of the pulmonary trunk indicating arterial hypertension is again noted, measuring 5.6 cm. Superior pulmonary veins are slightly prominent but no more prominent than previously. There are no visible significant coronary calcifications. There is minimal scattered aortic calcific plaque of the descending segment. The aorta and great  vessels are otherwise clear. There is no aortic aneurysm, stenosis or dissection. Mediastinum/Nodes: There are mildly prominent subcarinal and bilateral hilar lymph nodes both up to 1.2 cm short axis, but the adenopathy has improved since 06/29/2022 and is less prominent. A few right paratracheal nodes of similar size are also smaller than previously as well as borderline prominent left-sided prevascular nodes. There is no new or worsening adenopathy, no bulky or encasing nodes. Axillary spaces are clear. The thyroid gland, the thoracic trachea, main  bronchi and thoracic esophagus are unremarkable. Lungs/Pleura: There are small layering pleural effusions on the right greater than left, similar to the last CT. There are mild features of subpleural interstitial edema in the lung bases but this is not seen elsewhere. There is interval increase patchy consolidation in the left lower lobe, most dense alongside the effusion. Additional patchy airspace disease noted in the right greater than left upper lobes but less dense than previously, with patchy ground-glass and tree-in-bud interstitial changes in the base of the right upper lobe and in the right lower lobe, with minimal consolidation in the posterior right middle lobe. Findings favor multifocal pneumonia. An aspiration component is not excluded. Upper Abdomen: No acute findings. The liver is mildly steatotic. A thin walled homogeneous cyst, Hounsfield density of -1, is again noted alongside the pancreatic tail and may represent a pseudocyst, pancreatic cyst or mucinous neoplasm. This was first seen on the CTA chest from 03/15/2022. The last abdomen and pelvis dedicated CT was 05/14/2015 and this was not present at that time. MRI without and with contrast is recommended on a nonemergent basis to further characterize this entity. Musculoskeletal: History of myeloma. Expansile lesion again noted in the posterior right sixth rib and T11 spinous process. There is additional lytic lesion again in the left L1 and left L2 pedicles. A dorsal fusion construct with T2 corpectomy and interbody hardware is again also noted spanning T1-4 and seems unchanged. There is bridging enthesopathy multiple other thoracic levels. No new abnormality. IMPRESSION: 1. Multifocal airspace disease in the right greater than left lungs, with interval increase in consolidation in the left lower lobe. Findings favor multifocal pneumonia. An aspiration component is not excluded. 2. Small right greater than left pleural effusions, similar to the  last CT. 3. Mild subpleural interstitial edema in the lung bases. Mild distention of the pulmonary veins. 4. Chronic prominence of the pulmonary trunk indicating arterial hypertension. 5. Aortic atherosclerosis.  Borderline cardiomegaly. 6. Mediastinal and hilar adenopathy, but has improved from the last CTA. 7. History of myeloma with expansile lesions in the posterior right sixth rib, T11 spinous process, left L1 and L2 pedicles. No appreciable change from the last CT. 8. Thin walled 2.8 cm homogeneous cystic lesion alongside the pancreatic tail, Hounsfield density -1, Hounsfield density of -1, not present on the last CT abdomen and pelvis in 2017. Nonemergent MRI without and with contrast is recommended to further characterize this. 9. Hepatic steatosis. Aortic Atherosclerosis (ICD10-I70.0). Electronically Signed   By: Almira Bar M.D.   On: 09/30/2022 04:17   DG CHEST PORT 1 VIEW  Result Date: 09/28/2022 CLINICAL DATA:  Dyspnea. EXAM: PORTABLE CHEST 1 VIEW COMPARISON:  09/26/2022. FINDINGS: Unchanged hazy and consolidative opacities right upper and lower lobes, as well as the left lower lung, again suspicious for multifocal pneumonia. Stable cardiac and mediastinal contours. No pleural effusion or pneumothorax. IMPRESSION: Unchanged hazy and consolidative opacities right upper and lower lobes, as well as the left lower lung, again suspicious for multifocal  pneumonia. Electronically Signed   By: Orvan Falconer M.D.   On: 09/28/2022 14:53    ASSESSMENT & PLAN JULIES CARMICKLE is a 72 y.o. male with medical history significant for IgA Kappa Multiple Myeloma who presents for a follow up visit.   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  He meets diagnostic criteria by having a serum free light chain ratio greater than 100 and lytic lesions causing pathological fracture of the humerus.  Given this I would  recommend we pursued Darzalex, Revlimid, and dexamethasone.  Given his advanced age I would not recommend transplantation, that this is something we can consider when it comes time to transition to maintenance therapy versus transplant.  The patient voices understanding of the plan moving forward.  # IgA Kappa Multiple Myeloma --Metastatic survey completed 12/15/2021 --Patient underwent fixation of his humerus on 12/17/2021, pathology confirmed plasmacytoma/plasma cell neoplasm.  --Received 6 cycles of Darzalex, Revlimid, and dexamethasone started on 12/28/2021-05/18/2022.  --Due to worsening myeloma osseous lesions including pathologic fracture at T2 with cord compression, Dr. Leonides Schanz recommends changing therapy to Velcade/Pomalyst/Dex.  Plan: --Due for Cycle 5, Day 1 of Velcade/Pom/Dex today --labs from today showed white blood cell 0.3, hemoglobin 1.3, MCV 97.8, and platelets of 156 --restaging labs from 10/06/2022 with SPEP and SFLC showed M protein not detectable. Kappa increased to 49.0 (from 24.2), Lambda 12.0, Ratio 4.08 --Pomalyst was dose reduced to 2 mg p.o. due to cytopenias. Recommend to continue --RTC in 2 weeks with interval weekly treatments.   #Neck pain and mid back pain: --Suspect pain is exacerbated from recent hospitalization and being in bed/sitting for long periods.  --Due to history of lytic lesions with cord compression, we will obtain MRI C/T/L spine to rule out new or worsening disease.   #Palliative Radiation Therapy: # Dysphagia --Received palliative radiation from 01/06/2022-01/19/2022 to right hymerus, right forearm and right chest/rib, 20 Gy in 10 Fx.  --recieved palliative radiation to surgical bed in the upper thoracic spine area on 07/13/2022.  27 Gy in 9 Fx.   # Rash Concerning for Shingles-resolved -- Prescribed valacyclovir 3 times daily 1000 mg x 10 days-completed the course -- We will continue to monitor rash closely.  #DVT: --Found to have acute DVT  involving left peroneal veins. Likely secondary to recent surgery and hospitalization --Currently on eliquis 5 mg BID.    #Hypokalemia: --Currently on potassium chloride 60 mEq per day as prescribed but takes three times a day (60 mEq) on the days he takes Metolazine.  --Potassium level is 3.8 today. No further intervention needed.   #Rash on scalp and right forearm: --Okay to try topical hydrocortisone cream for spot treatment  #Supportive Care -- chemotherapy education complete -- port placement not required -- zofran 8mg  q8H PRN and compazine 10mg  PO q6H for nausea -- acyclovir 400mg  PO BID for VCZ prophylaxis -- Started Zometa q 12 weeks on 05/18/2022. Next dose April 2024.    No orders of the defined types were placed in this encounter.   All questions were answered. The patient knows to call the clinic with any problems, questions or concerns.  A total of more than 30 minutes were spent on this encounter with face-to-face time and non-face-to-face time, including preparing to see the patient, ordering tests and/or medications, counseling the patient and coordination of care as outlined above.   Ulysees Barns, MD Department of Hematology/Oncology [facility]  [facility] [facility] Phone: [phone] Pager: [phone] Email: [email or web address]    10/27/2022 5:30 PM

## 2022-10-28 ENCOUNTER — Ambulatory Visit: Payer: Medicare Other

## 2022-10-28 DIAGNOSIS — S22000A Wedge compression fracture of unspecified thoracic vertebra, initial encounter for closed fracture: Secondary | ICD-10-CM

## 2022-10-28 DIAGNOSIS — G8929 Other chronic pain: Secondary | ICD-10-CM | POA: Diagnosis not present

## 2022-10-28 DIAGNOSIS — M542 Cervicalgia: Secondary | ICD-10-CM | POA: Diagnosis not present

## 2022-10-28 DIAGNOSIS — M6281 Muscle weakness (generalized): Secondary | ICD-10-CM

## 2022-10-28 DIAGNOSIS — R293 Abnormal posture: Secondary | ICD-10-CM | POA: Diagnosis not present

## 2022-10-28 DIAGNOSIS — C9000 Multiple myeloma not having achieved remission: Secondary | ICD-10-CM

## 2022-10-28 DIAGNOSIS — R278 Other lack of coordination: Secondary | ICD-10-CM | POA: Diagnosis not present

## 2022-10-28 DIAGNOSIS — M25511 Pain in right shoulder: Secondary | ICD-10-CM | POA: Diagnosis not present

## 2022-10-28 LAB — KAPPA/LAMBDA LIGHT CHAINS
Kappa free light chain: 41.8 mg/L — ABNORMAL HIGH (ref 3.3–19.4)
Kappa, lambda light chain ratio: 2.75 — ABNORMAL HIGH (ref 0.26–1.65)
Lambda free light chains: 15.2 mg/L (ref 5.7–26.3)

## 2022-10-28 NOTE — Therapy (Signed)
OUTPATIENT PHYSICAL THERAPY ONCOLOGY TREATMENT  Patient Name: Travis [name] MRN: [id number] DOB:1950/07/19, 72 y.o., male Today's Date: 10/29/2022  END OF SESSION:  PT End of Session - 10/29/22 1508     Visit Number 9    Number of Visits 16    Date for PT Re-Evaluation 11/04/22    PT Start Time 1305    PT Stop Time 1345    PT Time Calculation (min) 40 min    Activity Tolerance Patient tolerated treatment well    Behavior During Therapy [facility] for tasks assessed/performed              Past Medical History:  Diagnosis Date   A-fib [facility])    Arthritis    "comes and goes; mostly in hands, occasionally elbow" (04/05/2017)   Complication of anesthesia    "just wanted to sleep alot  for couple days S/P VARICOCELE OR" (04/05/2017)   DVT (deep venous thrombosis) (HCC)    LLE   Dysrhythmia    PVCs    GERD (gastroesophageal reflux disease)    History of hiatal hernia    History of radiation therapy    Thoracic Spine- 07/13/22-07/23/22-Dr. Antony Blackbird   Hypertension    Impaired glucose tolerance    Multiple myeloma (HCC)    Obesity (BMI 30-39.9) 07/26/2016   OSA on CPAP 07/26/2016   Mild with AHI 12.5/hr now on CPAP at 14cm H2O   PAF (paroxysmal atrial fibrillation) (HCC)    s/p PVI Ablation in 2018 // Flecainide Rx // Echo 8/21: EF 60-65, no RWMA, mild LVH, normal RV SF, moderate RVE, mild BAE, trivial MR, mild dilation of aortic root (40 mm)   Pneumonia    "may have had walking pneumonia a few years ago" (04/05/2017)   Pre-diabetes    Thrombophlebitis    Past Surgical History:  Procedure Laterality Date   ATRIAL FIBRILLATION ABLATION  04/05/2017   ATRIAL FIBRILLATION ABLATION N/A 04/05/2017   Procedure: ATRIAL FIBRILLATION ABLATION;  Surgeon: [name], MD;  Location: [facility];  Service: Cardiovascular;  Laterality: N/A;   BONE BIOPSY Right 12/17/2021   Procedure: BONE BIOPSY;  Surgeon: [name], MD;  Location: [facility];  Service:  Orthopedics;  Laterality: Right;   COMPLETE RIGHT HIP REPLACMENT  01/19/2019   EVALUATION UNDER ANESTHESIA WITH HEMORRHOIDECTOMY N/A 02/24/2017   Procedure: EXAM UNDER ANESTHESIA WITH  HEMORRHOIDECTOMY;  Surgeon: [name], MD;  Location: [facility];  Service: General;  Laterality: N/A;   EXCISIONAL TOTAL HIP ARTHROPLASTY WITH ANTIBIOTIC SPACERS Right 09/25/2020   Procedure: EXCISIONAL TOTAL HIP ARTHROPLASTY WITH ANTIBIOTIC SPACERS;  Surgeon: [name], MD;  Location: [facility];  Service: Orthopedics;  Laterality: Right;   FLEXIBLE SIGMOIDOSCOPY N/A 04/15/2015   Procedure:  UNSEDATED FLEXIBLE SIGMOIDOSCOPY;  Surgeon: [name], MD;  Location: [facility];  Service: Endoscopy;  Laterality: N/A;   HUMERUS IM NAIL Right 12/17/2021   Procedure: INTRAMEDULLARY (IM) NAIL HUMERAL;  Surgeon: [name], MD;  Location: [facility];  Service: Orthopedics;  Laterality: Right;   KNEE ARTHROSCOPY Left ~ 2016   POSTERIOR CERVICAL FUSION/FORAMINOTOMY N/A 06/04/2022   Procedure: Thoracic one - Thoracic Four Posterior lateral arthrodesis/ fusion and Thoracic two Corpectomy;  Surgeon: [name], MD;  Location: [facility];  Service: Neurosurgery;  Laterality: N/A;   REIMPLANTATION OF TOTAL HIP Right 12/25/2020   Procedure: REIMPLANTATION/REVISION OF RIGHT TOTAL HIP VERSUS REPEAT IRRIGATION AND DEBRIDEMENT;  Surgeon: [name], MD;  Location: [facility];  Service: Orthopedics;  Laterality: Right;   TESTICLE SURGERY  1988   VARICOCELE   TONSILLECTOMY     VARICOSE VEIN SURGERY Bilateral    Patient Active Problem List   Diagnosis Date Noted   Pancreatic cyst 09/30/2022   Infection due to novel influenza A virus 09/29/2022   Persistent atrial fibrillation (HCC) 07/15/2022   Acute on chronic diastolic heart failure (HCC) 07/03/2022   Pulmonary infiltrates 07/01/2022   Acute respiratory failure with hypoxia (HCC) 07/01/2022   Hyponatremia 06/29/2022   History of thoracic surgery 06/29/2022    Acute DVT of left tibial vein (HCC) 06/21/2022   Hypokalemia 06/21/2022   Vitamin D deficiency 05/30/2022   Other constipation 05/29/2022   Pathologic compression fracture of spine, initial encounter (HCC) 05/29/2022   Cord compression (HCC) 05/28/2022   Degenerative cervical disc 05/14/2022   Hypercoagulable state due to persistent atrial fibrillation (HCC) 01/11/2022   Multiple myeloma (HCC) 12/10/2021   Medication monitoring encounter 02/27/2021   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   Status post peripherally inserted central catheter (PICC) central line placement 11/06/2020   Drug rash with eosinophilia and systemic symptoms syndrome 11/06/2020   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   PAF (paroxysmal atrial fibrillation) (HCC)    GERD (gastroesophageal reflux disease) 03/29/2018   Hiatal hernia 03/29/2018   History of DVT (deep vein thrombosis) 03/29/2018   CAP (community acquired pneumonia) 03/29/2018   Osteoarthritis 03/29/2018   Neuropathy 08/29/2017   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   Paroxysmal atrial fibrillation with rapid ventricular response (HCC) 04/05/2017   Prolapsed internal hemorrhoids, grade 4, s/p ligation/pexy/hemorrhoidectomy x 2 02/24/2017 02/24/2017   OSA (obstructive sleep apnea) 07/26/2016   Obesity (BMI 30-39.9) 07/26/2016   Atrial fibrillation with RVR (HCC)    Essential hypertension    Chest pain at rest     PCP: Eleanora Neighbor, MD  REFERRING PROVIDER: Antony Blackbird, MD  REFERRING DIAG: Multiple Myeloma  THERAPY DIAG:  Compression fracture of thoracic vertebra, unspecified thoracic vertebral level, initial encounter (HCC)  Multiple myeloma not having achieved remission (HCC)  Muscle weakness (generalized)  Abnormal posture  Neck pain  Other lack of coordination  Chronic right shoulder pain  ONSET DATE: 2023  Rationale for Evaluation and Treatment: Rehabilitation  SUBJECTIVE:                                                                                                                                                                                            SUBJECTIVE STATEMENT: Been rough since my fall. I was not sore at all after my last aquatic PT.  PERTINENT HISTORY:  [name] 72 y.o. male with medical history significant for IgA Kappa Multiple Myeloma. He was diagnosed with smouldering myeloma in 2018 . Last July he developed right arm pain due to a pathologic fx and he underwent radiation and fixation. In early Feb 2024 he had radiation for pathologic T2 compression fx followed by a T1-T4 fusion. PAIN:  Are you having pain? PAIN:  Are you having pain? Not right now NPRS scale: 2/10 Pain location: shoulder blades and neck, bilateral thoracic paraspinals Pain orientation: Bilateral  PAIN TYPE: aching and tight Pain description: constant  Aggravating factors: getting up and down, looking down Relieving factors: sleeping, laying down.   PRECAUTIONS: Multiple Myeloma with mets to R ribs, R humerus with ORIF for pathological fracture 12/17/21. Lesions have also been found in R rib and also in his skull. Pathologic fx T2 with fusion T1-4 on 06/04/22, Bilateral LE edema (on Lasix), Afib   WEIGHT BEARING RESTRICTIONS: No  FALLS:  Has patient fallen in last 6 months? Yes. Number of falls 1  LIVING ENVIRONMENT: Lives with: lives with their family and lives with their spouse Lives in: House/apartment Stairs: Yes; Internal: 14 steps; on left going up and External: 3 steps; on right going up Has following equipment at home: Single point cane and Walker - 4 wheeled  OCCUPATION: attorney/ mostly retired?  LEISURE: golf (can't do now.), use to play racquetball  HAND DOMINANCE: right   PRIOR LEVEL OF FUNCTION: Independent  POSTURE:significant slump sitting posture with forward head  PATIENT GOALS: decrease pain,get stronger,improve  balance   OBJECTIVE:  COGNITION: Overall cognitive status: Within functional limits for tasks assessed   PALPATION: Very tender bilateral UT/levator, scapular regions  OBSERVATIONS / OTHER ASSESSMENTS: very flexed head and neck with chin nearly at chest  SENSATION: Light touch: Appears intact    POSTURE: significant forward head, round shoulders, increased kyphosis   Gait holding single point cane in left hand but not using. Right shoulder low, flexed neck;difficulty raising head to see where he is going  UPPER EXTREMITY AROM/PROM:  A/PROM RIGHT   eval   Shoulder extension   Shoulder flexion 91 with compensation  Shoulder abduction 67 with compensation  Shoulder internal rotation   Shoulder external rotation C6    (Blank rows = not tested)  A/PROM LEFT   eval  Shoulder extension   Shoulder flexion 123  Shoulder abduction 112  Shoulder internal rotation T7  Shoulder external rotation t3    (Blank rows = not tested)  CERVICAL AROM: All within normal limits:    Percent limited  Flexion Fully flexed  Extension From flexed position decreased 80%  Right lateral flexion   Left lateral flexion   Right rotation Dec 50% sitting in FH posture  Left rotation        Dec 50%    UPPER EXTREMITY STRENGTH:    LOWER EXTREMITY AROM/PROM:  A/PROM Right eval  Hip flexion   Hip extension   Hip abduction   Hip adduction   Hip internal rotation   Hip external rotation   Knee flexion   Knee extension   Ankle dorsiflexion   Ankle plantarflexion   Ankle inversion   Ankle eversion    (Blank rows = not tested)  A/PROM LEFT eval  Hip flexion   Hip extension   Hip abduction   Hip adduction   Hip internal rotation   Hip external rotation   Knee flexion   Knee extension   Ankle dorsiflexion   Ankle  plantarflexion   Ankle inversion   Ankle eversion    (Blank rows = not tested)  LOWER EXTREMITY MMT:   LYMPHEDEMA ASSESSMENTS:   SURGERY TYPE/DATE:  8/17/23Left prox humerus fixation for pathologic fx  2/2/2024Fusion T1-T4 for pathologic fx with cord compression 06/04/22 posterior cervical fusion/foraminotomy;T1-4 fusion NUMBER OF LYMPH NODES REMOVED: NA  CHEMOTHERAPY: Velcade/promalyst/Dex;  RADIATION:to prox humerus and thoracic spine pathologic fx  HORMONE TREATMENT: NA  INFECTIONS: NA  FUNCTIONAL TESTS:    GAIT: Distance walked: 20 Assistive device utilized: carried SPC in left Level of assistance: Complete Independence Comments: feels unsteady, very flexed neck posture   QUICK DASH SURVEY:    TODAY'S TREATMENT:                                                                                                                                         DATE:   10/29/22: Pt arrives for aquatic physical therapy. Treatment took place in 3.5-5.5 feet of water. Water temperature was 90 degrees F. Pt entered the pool via stairs, step to step slowly with heavy use of rails.Pt requires buoyancy of water for support and to offload joints with strengthening exercises.   Seated water bench with 75% submersion Pt performed seated LE AROM exercises 20x in all planes, while discussing current status/pain/symptoms/and how we should modify todays treatment. 75% depth water walking with bar bell for postural support. VC to lift chin for one length/relax muscles walking back. 6x in each direction. Static stance unassisted: mitten hand arm presses frwd/bk 10x. Small step forward/wide tandem stance 15 sec 2x Bil. CGA. Vc to lift chin. VC to lift chin. Horizontal decompression float with 100% flotation and PTA providing lateral sway to mobilize trunk and soft tissue work along the scar.    10/28/2022  NuStep Level 5 seat 12/UE 7 x 8 mins;  Pt in gait belt Heel raises x 15 light HH Marching intermittent light HH Sidestepping 4 laps in bars with no HH, but CGA of PT Forward stepping on ax x 10 ea, LAQ 3# 2 x 10 ea 02 98, HR 122 STM to bil UT/upper  scap region and cervical muscles and bilateral paraspinal muscles in prop sitting with cocoa butter VC required throughout for proper posture/head position At end of rx 02 98, HR varying between 112 and 60  10/20/22:Pt arrives for aquatic physical therapy. Treatment took place in 3.5-5.5 feet of water. Water temperature was 90 degrees F. Pt entered the pool via stairs, step to step slowly with heavy use of rails.Pt requires buoyancy of water for support and to offload joints with strengthening exercises.   Seated water bench with 75% submersion Pt performed seated LE AROM exercises 20x in all planes, while discussing current status/pain/symptoms/and how we should modify todays treatment. 75% depth water walking with bar bell for postural support. VC to lift chin for one length/relax muscles walking back. 6x in each  direction. Pt demonstrates less unsteadiness today. Static stance unassisted: mitten hand arm presses frwd/bk 10x. Pt unsteady. Vc to lift chin. Single buoy UE weights for horizontal abd/add 10x. VC to lift chin. Horizontal decompression float with 100% flotation and PTA providing lateral sway to mobilize trunk and soft tissue work along the scar.   10/18/2022 NuStep Level 5 seat 12/UE 7 x 8 mins; 569 steps In parallel bars: Standing heel raises x 12 light hand touch, marching light hand touch 2 x 10 Sidestepping 3 lengths and return intermittent light touch Sit to stand 2 x 5 with 1 hand A Step ups on ax x 10 ea forward and sideways B Vector reaches 2 x 5  B Ambulation on beam forward 6 lengths and sideways 6 length;no HH to light HH STM to bil UT/upper scap region and cervical muscles and bilateral paraspinal muscles in prop sitting with cocoa butter VC required throughout for proper posture/head position   PATIENT EDUCATION:  Education details: sitting posture with lumbar roll Person educated: Patient Education method: Medical illustrator Education comprehension:  verbalized understanding and returned demonstration  HOME EXERCISE PROGRAM: Doing HEP from HHPT; cervical ROM, shoulder rolls, shrugs, scap retraction, UT stretches  ASSESSMENT:  CLINICAL IMPRESSION: Pt arrives to aquatic PT with mild upper back/neck pain. Pt is going see Dr Mikal Plane when he returns from his trip regarding his lumbar MRI.Pt slightly unsteady in the pool today, has more difficulty with current. Static wide tandem stance was his best in regards to balance. Horizontal decompresion offers nice stretch to neck pt reports that is nonpainful.   OBJECTIVE IMPAIRMENTS: decreased activity tolerance, decreased balance, difficulty walking, decreased ROM, decreased strength, increased edema, impaired UE functional use, postural dysfunction, and pain.   ACTIVITY LIMITATIONS: carrying, lifting, dressing, reach over head, and locomotion level  PARTICIPATION LIMITATIONS: cleaning, laundry, driving, and occupation  PERSONAL FACTORS:  Multiple Myeloma  are also affecting patient's functional outcome.   REHAB POTENTIAL: Good  CLINICAL DECISION MAKING: Evolving/moderate complexity  EVALUATION COMPLEXITY: Moderate  GOALS: Goals reviewed with patient? Yes  SHORT TERM GOALS: Target date: 10/21/22  Pt will be educated in and independent in proper sitting posture using lumbar roll prn Baseline: Goal status: INITIAL  2.  Pt will report decreased muscle soreness in bilateral upper back x 25% Baseline:  Goal status: INITIAL  3.  Pt will be compliant with a HEP for postural strengthening/ROM Baseline:  Goal status: INITIAL   LONG TERM GOALS: Target date: 11/04/22  Pt will be able to bring his head to neutral position for safety with ambulation/driving Baseline:  Goal status: INITIAL  2.  Pt will have decreased upper back pain by 50% or more Baseline:  Goal status: INITIAL  3.  Pt will be able to flex right shoulder to 115 degrees with minimal compensation for improved reaching  ability Baseline:  Goal status: INITIAL 4; Pt will be able to maintain tandem stance on both sides x 10 sec to demonstrated improved balance. Goal status:NEW   PT FREQUENCY: 2x/week  PT DURATION: 6 weeks  PLANNED INTERVENTIONS: Therapeutic exercises, Therapeutic activity, Neuromuscular re-education, Balance training, Gait training, Patient/Family education, Self Care, Joint mobilization, Aquatic Therapy, Manual therapy, and Re-evaluation  PLAN FOR NEXT SESSION: Pt will have 1 more pool session before his trip and possibly 1 more land appt.   Ane Payment, PTA 10/29/22 3:09 PM

## 2022-10-28 NOTE — Therapy (Signed)
OUTPATIENT PHYSICAL THERAPY ONCOLOGY TREATMENT  Patient Name: Travis [name] MRN: [id number] DOB:15-Feb-1951, 72 y.o., male Today's Date: 10/28/2022  END OF SESSION:  PT End of Session - 10/28/22 1105     Visit Number 8    Number of Visits 16    Date for PT Re-Evaluation 11/04/22    PT Start Time 1106    PT Stop Time 1152    PT Time Calculation (min) 46 min    Activity Tolerance Patient tolerated treatment well    Behavior During Therapy WFL for tasks assessed/performed             Past Medical History:  Diagnosis Date   A-fib (HCC)    Arthritis    "comes and goes; mostly in hands, occasionally elbow" (04/05/2017)   Complication of anesthesia    "just wanted to sleep alot  for couple days S/P VARICOCELE OR" (04/05/2017)   DVT (deep venous thrombosis) (HCC)    LLE   Dysrhythmia    PVCs    GERD (gastroesophageal reflux disease)    History of hiatal hernia    History of radiation therapy    Thoracic Spine- 07/13/22-07/23/22-Dr. Antony Blackbird   Hypertension    Impaired glucose tolerance    Multiple myeloma (HCC)    Obesity (BMI 30-39.9) 07/26/2016   OSA on CPAP 07/26/2016   Mild with AHI 12.5/hr now on CPAP at 14cm H2O   PAF (paroxysmal atrial fibrillation) (HCC)    s/p PVI Ablation in 2018 // Flecainide Rx // Echo 8/21: EF 60-65, no RWMA, mild LVH, normal RV SF, moderate RVE, mild BAE, trivial MR, mild dilation of aortic root (40 mm)   Pneumonia    "may have had walking pneumonia a few years ago" (04/05/2017)   Pre-diabetes    Thrombophlebitis    Past Surgical History:  Procedure Laterality Date   ATRIAL FIBRILLATION ABLATION  04/05/2017   ATRIAL FIBRILLATION ABLATION N/A 04/05/2017   Procedure: ATRIAL FIBRILLATION ABLATION;  Surgeon: [name], MD;  Location: [facility];  Service: Cardiovascular;  Laterality: N/A;   BONE BIOPSY Right 12/17/2021   Procedure: BONE BIOPSY;  Surgeon: [name], MD;  Location: [facility];  Service:  Orthopedics;  Laterality: Right;   COMPLETE RIGHT HIP REPLACMENT  01/19/2019   EVALUATION UNDER ANESTHESIA WITH HEMORRHOIDECTOMY N/A 02/24/2017   Procedure: EXAM UNDER ANESTHESIA WITH  HEMORRHOIDECTOMY;  Surgeon: [name], MD;  Location: [facility];  Service: General;  Laterality: N/A;   EXCISIONAL TOTAL HIP ARTHROPLASTY WITH ANTIBIOTIC SPACERS Right 09/25/2020   Procedure: EXCISIONAL TOTAL HIP ARTHROPLASTY WITH ANTIBIOTIC SPACERS;  Surgeon: [name], MD;  Location: [facility];  Service: Orthopedics;  Laterality: Right;   FLEXIBLE SIGMOIDOSCOPY N/A 04/15/2015   Procedure:  UNSEDATED FLEXIBLE SIGMOIDOSCOPY;  Surgeon: [name], MD;  Location: [facility];  Service: Endoscopy;  Laterality: N/A;   HUMERUS IM NAIL Right 12/17/2021   Procedure: INTRAMEDULLARY (IM) NAIL HUMERAL;  Surgeon: [name], MD;  Location: [facility];  Service: Orthopedics;  Laterality: Right;   KNEE ARTHROSCOPY Left ~ 2016   POSTERIOR CERVICAL FUSION/FORAMINOTOMY N/A 06/04/2022   Procedure: Thoracic one - Thoracic Four Posterior lateral arthrodesis/ fusion and Thoracic two Corpectomy;  Surgeon: [name], MD;  Location: [facility];  Service: Neurosurgery;  Laterality: N/A;   REIMPLANTATION OF TOTAL HIP Right 12/25/2020   Procedure: REIMPLANTATION/REVISION OF RIGHT TOTAL HIP VERSUS REPEAT IRRIGATION AND DEBRIDEMENT;  Surgeon: [name], MD;  Location: [facility];  Service:  Orthopedics;  Laterality: Right;   TESTICLE SURGERY  1988   VARICOCELE   TONSILLECTOMY     VARICOSE VEIN SURGERY Bilateral    Patient Active Problem List   Diagnosis Date Noted   Pancreatic cyst 09/30/2022   Infection due to novel influenza A virus 09/29/2022   Persistent atrial fibrillation (HCC) 07/15/2022   Acute on chronic diastolic heart failure (HCC) 07/03/2022   Pulmonary infiltrates 07/01/2022   Acute respiratory failure with hypoxia (HCC) 07/01/2022   Hyponatremia 06/29/2022   History of thoracic surgery 06/29/2022    Acute DVT of left tibial vein (HCC) 06/21/2022   Hypokalemia 06/21/2022   Vitamin D deficiency 05/30/2022   Other constipation 05/29/2022   Pathologic compression fracture of spine, initial encounter (HCC) 05/29/2022   Cord compression (HCC) 05/28/2022   Degenerative cervical disc 05/14/2022   Hypercoagulable state due to persistent atrial fibrillation (HCC) 01/11/2022   Multiple myeloma (HCC) 12/10/2021   Medication monitoring encounter 02/27/2021   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   Status post peripherally inserted central catheter (PICC) central line placement 11/06/2020   Drug rash with eosinophilia and systemic symptoms syndrome 11/06/2020   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   PAF (paroxysmal atrial fibrillation) (HCC)    GERD (gastroesophageal reflux disease) 03/29/2018   Hiatal hernia 03/29/2018   History of DVT (deep vein thrombosis) 03/29/2018   CAP (community acquired pneumonia) 03/29/2018   Osteoarthritis 03/29/2018   Neuropathy 08/29/2017   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   Paroxysmal atrial fibrillation with rapid ventricular response (HCC) 04/05/2017   Prolapsed internal hemorrhoids, grade 4, s/p ligation/pexy/hemorrhoidectomy x 2 02/24/2017 02/24/2017   OSA (obstructive sleep apnea) 07/26/2016   Obesity (BMI 30-39.9) 07/26/2016   Atrial fibrillation with RVR (HCC)    Essential hypertension    Chest pain at rest     PCP: Eleanora Neighbor, MD  REFERRING PROVIDER: Antony Blackbird, MD  REFERRING DIAG: Multiple Myeloma  THERAPY DIAG:  Compression fracture of thoracic vertebra, unspecified thoracic vertebral level, initial encounter (HCC)  Multiple myeloma not having achieved remission (HCC)  Abnormal posture  Muscle weakness (generalized)  Neck pain  ONSET DATE: 2023  Rationale for Evaluation and Treatment: Rehabilitation  SUBJECTIVE:                                                                                                                                                                                            SUBJECTIVE STATEMENT:  I fell Monday early am going to the bathroom. My right leg gave out while I was at the sink and I lowered myself down. EMS came and took me  to the ED. 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 My legs feel weak and it may be from a herniated disc. They didn't find anything new with the ED visit   PERTINENT HISTORY:  ZYSHONNE MALECHA 72 y.o. male with medical history significant for IgA Kappa Multiple Myeloma. He was diagnosed with smouldering myeloma in 2018 . Last July he developed right arm pain due to a pathologic fx and he underwent radiation and fixation. In early Feb 2024 he had radiation for pathologic T2 compression fx followed by a T1-T4 fusion. PAIN:  Are you having pain? PAIN:  Are you having pain? Not right now NPRS scale: 2/10 Pain location: shoulder blades and neck, bilateral thoracic paraspinals Pain orientation: Bilateral  PAIN TYPE: aching and tight Pain description: constant  Aggravating factors: getting up and down, looking down Relieving factors: sleeping, laying down.   PRECAUTIONS: Multiple Myeloma with mets to R ribs, R humerus with ORIF for pathological fracture 12/17/21. Lesions have also been found in R rib and also in his skull. Pathologic fx T2 with fusion T1-4 on 06/04/22, Bilateral LE edema (on Lasix), Afib   WEIGHT BEARING RESTRICTIONS: No  FALLS:  Has patient fallen in last 6 months? Yes. Number of falls 1  LIVING ENVIRONMENT: Lives with: lives with their family and lives with their spouse Lives in: House/apartment Stairs: Yes; Internal: 14 steps; on left going up and External: 3 steps; on right going up Has following equipment at home: Single point cane and Walker - 4 wheeled  OCCUPATION: attorney/ mostly retired?  LEISURE: golf (can't do now.), use to play racquetball  HAND  DOMINANCE: right   PRIOR LEVEL OF FUNCTION: Independent  POSTURE:significant slump sitting posture with forward head  PATIENT GOALS: decrease pain,get stronger,improve balance   OBJECTIVE:  COGNITION: Overall cognitive status: Within functional limits for tasks assessed   PALPATION: Very tender bilateral UT/levator, scapular regions  OBSERVATIONS / OTHER ASSESSMENTS: very flexed head and neck with chin nearly at chest  SENSATION: Light touch: Appears intact    POSTURE: significant forward head, round shoulders, increased kyphosis   Gait holding single point cane in left hand but not using. Right shoulder low, flexed neck;difficulty raising head to see where he is going  UPPER EXTREMITY AROM/PROM:  A/PROM RIGHT   eval   Shoulder extension   Shoulder flexion 91 with compensation  Shoulder abduction 67 with compensation  Shoulder internal rotation   Shoulder external rotation C6    (Blank rows = not tested)  A/PROM LEFT   eval  Shoulder extension   Shoulder flexion 123  Shoulder abduction 112  Shoulder internal rotation T7  Shoulder external rotation t3    (Blank rows = not tested)  CERVICAL AROM: All within normal limits:    Percent limited  Flexion Fully flexed  Extension From flexed position decreased 80%  Right lateral flexion   Left lateral flexion   Right rotation Dec 50% sitting in FH posture  Left rotation        Dec 50%    UPPER EXTREMITY STRENGTH:    LOWER EXTREMITY AROM/PROM:  A/PROM Right eval  Hip flexion   Hip extension   Hip abduction   Hip adduction   Hip internal rotation   Hip external rotation   Knee flexion   Knee extension   Ankle dorsiflexion   Ankle plantarflexion   Ankle inversion   Ankle eversion    (Blank rows = not tested)  A/PROM LEFT eval  Hip flexion   Hip extension   Hip abduction   Hip adduction   Hip internal rotation   Hip external rotation   Knee flexion   Knee extension   Ankle dorsiflexion    Ankle plantarflexion   Ankle inversion   Ankle eversion    (Blank rows = not tested)  LOWER EXTREMITY MMT:   LYMPHEDEMA ASSESSMENTS:   SURGERY TYPE/DATE: 8/17/23Left prox humerus fixation for pathologic fx  2/2/2024Fusion T1-T4 for pathologic fx with cord compression 06/04/22 posterior cervical fusion/foraminotomy;T1-4 fusion NUMBER OF LYMPH NODES REMOVED: NA  CHEMOTHERAPY: Velcade/promalyst/Dex;  RADIATION:to prox humerus and thoracic spine pathologic fx  HORMONE TREATMENT: NA  INFECTIONS: NA  FUNCTIONAL TESTS:    GAIT: Distance walked: 20 Assistive device utilized: carried SPC in left Level of assistance: Complete Independence Comments: feels unsteady, very flexed neck posture   QUICK DASH SURVEY:    TODAY'S TREATMENT:                                                                                                                                         DATE:   10/28/2022  NuStep Level 5 seat 12/UE 7 x 8 mins;  Pt in gait belt Heel raises x 15 light HH Marching intermittent light HH Sidestepping 4 laps in bars with no HH, but CGA of PT Forward stepping on ax x 10 ea, LAQ 3# 2 x 10 ea 02 98, HR 122 STM to bil UT/upper scap region and cervical muscles and bilateral paraspinal muscles in prop sitting with cocoa butter VC required throughout for proper posture/head position At end of rx 02 98, HR varying between 112 and 60  10/20/22:Pt arrives for aquatic physical therapy. Treatment took place in 3.5-5.5 feet of water. Water temperature was 90 degrees F. Pt entered the pool via stairs, step to step slowly with heavy use of rails.Pt requires buoyancy of water for support and to offload joints with strengthening exercises.   Seated water bench with 75% submersion Pt performed seated LE AROM exercises 20x in all planes, while discussing current status/pain/symptoms/and how we should modify todays treatment. 75% depth water walking with bar bell for postural support. VC  to lift chin for one length/relax muscles walking back. 6x in each direction. Pt demonstrates less unsteadiness today. Static stance unassisted: mitten hand arm presses frwd/bk 10x. Pt unsteady. Vc to lift chin. Single buoy UE weights for horizontal abd/add 10x. VC to lift chin. Horizontal decompression float with 100% flotation and PTA providing lateral sway to mobilize trunk and soft tissue work along the scar.   10/18/2022 NuStep Level 5 seat 12/UE 7 x 8 mins; 569 steps In parallel bars: Standing heel raises x 12 light hand touch, marching light hand touch 2 x 10 Sidestepping 3 lengths and return intermittent light touch Sit to stand 2 x 5 with 1 hand A Step ups on ax x  10 ea forward and sideways B Vector reaches 2 x 5  B Ambulation on beam forward 6 lengths and sideways 6 length;no HH to light HH STM to bil UT/upper scap region and cervical muscles and bilateral paraspinal muscles in prop sitting with cocoa butter VC required throughout for proper posture/head position   10/15/22:Pt arrives for aquatic physical therapy. Treatment took place in 3.5-5.5 feet of water. Water temperature was 90 degrees F. Pt entered the pool via stairs, step to step slowly with heavy use of rails.Pt requires buoyancy of water for support and to offload joints with strengthening exercises.   Seated water bench with 75% submersion Pt performed seated LE AROM exercises 20x in all planes, while discussing current status/pain/symptoms/and how we would use the water principles to help address his complaints. 75% depth water walking with large noodle for postural support. VC to lift chin for one length/relax muscles walking back. 6x in each direction. Pt frequently unsteady. Static stance unassisted: mitten hand arm presses frwd/bk 10x. Pt unsteady. Vc to lift chin. Single buoy UE weights for horizontal abd/add 10x. VC to lift chin. Horizontal decompression float with 100% flotation and PTA providing lateral sway to  mobilize trunk and soft tissue work along the scar.  PATIENT EDUCATION:  Education details: sitting posture with lumbar roll Person educated: Patient Education method: Medical illustrator Education comprehension: verbalized understanding and returned demonstration  HOME EXERCISE PROGRAM: Doing HEP from HHPT; cervical ROM, shoulder rolls, shrugs, scap retraction, UT stretches  ASSESSMENT:  CLINICAL IMPRESSION:  Pt was noted to get a little winded today with standing exs and when asked said he was tachycardic this am. He reported taking Metoprolol earlier this am. HR decreased after resting  and during manual therapy but ranged from 112 to 60 and everything in between on Pulse ox.  Legs fatigue easily with exs and pt required a couple sitting rest breaks. Still requiring multiple VC's for erect posture. Requires use of hands for balance in parallel bars. Pt reports getting lasting benefit from Manual therapy sometimes lasting until the next session.  OBJECTIVE IMPAIRMENTS: decreased activity tolerance, decreased balance, difficulty walking, decreased ROM, decreased strength, increased edema, impaired UE functional use, postural dysfunction, and pain.   ACTIVITY LIMITATIONS: carrying, lifting, dressing, reach over head, and locomotion level  PARTICIPATION LIMITATIONS: cleaning, laundry, driving, and occupation  PERSONAL FACTORS:  Multiple Myeloma  are also affecting patient's functional outcome.   REHAB POTENTIAL: Good  CLINICAL DECISION MAKING: Evolving/moderate complexity  EVALUATION COMPLEXITY: Moderate  GOALS: Goals reviewed with patient? Yes  SHORT TERM GOALS: Target date: 10/21/22  Pt will be educated in and independent in proper sitting posture using lumbar roll prn Baseline: Goal status: INITIAL  2.  Pt will report decreased muscle soreness in bilateral upper back x 25% Baseline:  Goal status: INITIAL  3.  Pt will be compliant with a HEP for postural  strengthening/ROM Baseline:  Goal status: INITIAL   LONG TERM GOALS: Target date: 11/04/22  Pt will be able to bring his head to neutral position for safety with ambulation/driving Baseline:  Goal status: INITIAL  2.  Pt will have decreased upper back pain by 50% or more Baseline:  Goal status: INITIAL  3.  Pt will be able to flex right shoulder to 115 degrees with minimal compensation for improved reaching ability Baseline:  Goal status: INITIAL 4; Pt will be able to maintain tandem stance on both sides x 10 sec to demonstrated improved balance. Goal status:NEW   PT  FREQUENCY: 2x/week  PT DURATION: 6 weeks  PLANNED INTERVENTIONS: Therapeutic exercises, Therapeutic activity, Neuromuscular re-education, Balance training, Gait training, Patient/Family education, Self Care, Joint mobilization, Aquatic Therapy, Manual therapy, and Re-evaluation  PLAN FOR NEXT SESSION: See how aquatic session went. Was pt less sore?  Ane Payment, PTA 10/28/22 12:09 PM

## 2022-10-29 ENCOUNTER — Other Ambulatory Visit: Payer: Self-pay | Admitting: *Deleted

## 2022-10-29 ENCOUNTER — Ambulatory Visit: Payer: Medicare Other | Admitting: Physical Therapy

## 2022-10-29 ENCOUNTER — Encounter: Payer: Self-pay | Admitting: Physical Therapy

## 2022-10-29 DIAGNOSIS — R278 Other lack of coordination: Secondary | ICD-10-CM | POA: Diagnosis not present

## 2022-10-29 DIAGNOSIS — M6281 Muscle weakness (generalized): Secondary | ICD-10-CM | POA: Diagnosis not present

## 2022-10-29 DIAGNOSIS — M542 Cervicalgia: Secondary | ICD-10-CM

## 2022-10-29 DIAGNOSIS — S22000A Wedge compression fracture of unspecified thoracic vertebra, initial encounter for closed fracture: Secondary | ICD-10-CM | POA: Diagnosis not present

## 2022-10-29 DIAGNOSIS — M25511 Pain in right shoulder: Secondary | ICD-10-CM | POA: Diagnosis not present

## 2022-10-29 DIAGNOSIS — C9000 Multiple myeloma not having achieved remission: Secondary | ICD-10-CM | POA: Diagnosis not present

## 2022-10-29 DIAGNOSIS — R293 Abnormal posture: Secondary | ICD-10-CM

## 2022-10-29 DIAGNOSIS — G8929 Other chronic pain: Secondary | ICD-10-CM | POA: Diagnosis not present

## 2022-10-29 MED ORDER — POMALIDOMIDE 2 MG PO CAPS
2.0000 mg | ORAL_CAPSULE | Freq: Every day | ORAL | 0 refills | Status: DC
Start: 2022-10-29 — End: 2022-11-22

## 2022-11-01 ENCOUNTER — Ambulatory Visit: Payer: Medicare Other | Attending: Radiation Oncology

## 2022-11-01 DIAGNOSIS — C9000 Multiple myeloma not having achieved remission: Secondary | ICD-10-CM

## 2022-11-01 DIAGNOSIS — M542 Cervicalgia: Secondary | ICD-10-CM

## 2022-11-01 DIAGNOSIS — M6281 Muscle weakness (generalized): Secondary | ICD-10-CM | POA: Diagnosis not present

## 2022-11-01 DIAGNOSIS — R293 Abnormal posture: Secondary | ICD-10-CM | POA: Diagnosis not present

## 2022-11-01 DIAGNOSIS — X58XXXD Exposure to other specified factors, subsequent encounter: Secondary | ICD-10-CM | POA: Insufficient documentation

## 2022-11-01 DIAGNOSIS — S22000D Wedge compression fracture of unspecified thoracic vertebra, subsequent encounter for fracture with routine healing: Secondary | ICD-10-CM | POA: Insufficient documentation

## 2022-11-01 DIAGNOSIS — S22000A Wedge compression fracture of unspecified thoracic vertebra, initial encounter for closed fracture: Secondary | ICD-10-CM

## 2022-11-01 LAB — MULTIPLE MYELOMA PANEL, SERUM
Albumin SerPl Elph-Mcnc: 3.4 g/dL (ref 2.9–4.4)
Albumin/Glob SerPl: 1.9 — ABNORMAL HIGH (ref 0.7–1.7)
Alpha 1: 0.3 g/dL (ref 0.0–0.4)
Alpha2 Glob SerPl Elph-Mcnc: 0.7 g/dL (ref 0.4–1.0)
B-Globulin SerPl Elph-Mcnc: 0.7 g/dL (ref 0.7–1.3)
Gamma Glob SerPl Elph-Mcnc: 0.1 g/dL — ABNORMAL LOW (ref 0.4–1.8)
Globulin, Total: 1.8 g/dL — ABNORMAL LOW (ref 2.2–3.9)
IgA: 31 mg/dL — ABNORMAL LOW (ref 61–437)
IgG (Immunoglobin G), Serum: 269 mg/dL — ABNORMAL LOW (ref 603–1613)
IgM (Immunoglobulin M), Srm: 14 mg/dL — ABNORMAL LOW (ref 15–143)
Total Protein ELP: 5.2 g/dL — ABNORMAL LOW (ref 6.0–8.5)

## 2022-11-01 NOTE — Therapy (Signed)
OUTPATIENT PHYSICAL THERAPY ONCOLOGY TREATMENT  Patient Name: Travis [name] MRN: [id number] DOB:1950-12-22, 72 y.o., male Today's Date: 11/01/2022  Progress Note Reporting Period 09/23/2022 to 11/01/2022  See note below for Objective Data and Assessment of Progress/Goals.      END OF SESSION:  PT End of Session - 11/01/22 1102     Visit Number 10    Number of Visits 16    Date for PT Re-Evaluation 11/04/22    PT Start Time 1103    PT Stop Time 1200    PT Time Calculation (min) 57 min    Equipment Utilized During Treatment Gait belt    Activity Tolerance Patient tolerated treatment well    Behavior During Therapy WFL for tasks assessed/performed              Past Medical History:  Diagnosis Date   A-fib (HCC)    Arthritis    "comes and goes; mostly in hands, occasionally elbow" (04/05/2017)   Complication of anesthesia    "just wanted to sleep alot  for couple days S/P VARICOCELE OR" (04/05/2017)   DVT (deep venous thrombosis) (HCC)    LLE   Dysrhythmia    PVCs    GERD (gastroesophageal reflux disease)    History of hiatal hernia    History of radiation therapy    Thoracic Spine- 07/13/22-07/23/22-Dr. Antony Blackbird   Hypertension    Impaired glucose tolerance    Multiple myeloma (HCC)    Obesity (BMI 30-39.9) 07/26/2016   OSA on CPAP 07/26/2016   Mild with AHI 12.5/hr now on CPAP at 14cm H2O   PAF (paroxysmal atrial fibrillation) (HCC)    s/p PVI Ablation in 2018 // Flecainide Rx // Echo 8/21: EF 60-65, no RWMA, mild LVH, normal RV SF, moderate RVE, mild BAE, trivial MR, mild dilation of aortic root (40 mm)   Pneumonia    "may have had walking pneumonia a few years ago" (04/05/2017)   Pre-diabetes    Thrombophlebitis    Past Surgical History:  Procedure Laterality Date   ATRIAL FIBRILLATION ABLATION  04/05/2017   ATRIAL FIBRILLATION ABLATION N/A 04/05/2017   Procedure: ATRIAL FIBRILLATION ABLATION;  Surgeon: [name], MD;  Location: [facility];   Service: Cardiovascular;  Laterality: N/A;   BONE BIOPSY Right 12/17/2021   Procedure: BONE BIOPSY;  Surgeon: [name], MD;  Location: [facility];  Service: Orthopedics;  Laterality: Right;   COMPLETE RIGHT HIP REPLACMENT  01/19/2019   EVALUATION UNDER ANESTHESIA WITH HEMORRHOIDECTOMY N/A 02/24/2017   Procedure: EXAM UNDER ANESTHESIA WITH  HEMORRHOIDECTOMY;  Surgeon: [name], MD;  Location: [facility];  Service: General;  Laterality: N/A;   EXCISIONAL TOTAL HIP ARTHROPLASTY WITH ANTIBIOTIC SPACERS Right 09/25/2020   Procedure: EXCISIONAL TOTAL HIP ARTHROPLASTY WITH ANTIBIOTIC SPACERS;  Surgeon: [name], MD;  Location: [facility];  Service: Orthopedics;  Laterality: Right;   FLEXIBLE SIGMOIDOSCOPY N/A 04/15/2015   Procedure:  UNSEDATED FLEXIBLE SIGMOIDOSCOPY;  Surgeon: [name], MD;  Location: [facility];  Service: Endoscopy;  Laterality: N/A;   HUMERUS IM NAIL Right 12/17/2021   Procedure: INTRAMEDULLARY (IM) NAIL HUMERAL;  Surgeon: [name], MD;  Location: [facility];  Service: Orthopedics;  Laterality: Right;   KNEE ARTHROSCOPY Left ~ 2016   POSTERIOR CERVICAL FUSION/FORAMINOTOMY N/A 06/04/2022   Procedure: Thoracic one - Thoracic Four Posterior lateral arthrodesis/ fusion and Thoracic two Corpectomy;  Surgeon: [name], MD;  Location: [facility];  Service: Neurosurgery;  Laterality:  N/A;   REIMPLANTATION OF TOTAL HIP Right 12/25/2020   Procedure: REIMPLANTATION/REVISION OF RIGHT TOTAL HIP VERSUS REPEAT IRRIGATION AND DEBRIDEMENT;  Surgeon: [name], MD;  Location: [facility];  Service: Orthopedics;  Laterality: Right;   TESTICLE SURGERY  1988   VARICOCELE   TONSILLECTOMY     VARICOSE VEIN SURGERY Bilateral    Patient Active Problem List   Diagnosis Date Noted   Pancreatic cyst 09/30/2022   Infection due to novel influenza A virus 09/29/2022   Persistent atrial fibrillation (HCC) 07/15/2022   Acute on chronic diastolic heart failure (HCC)  16/02/9603   Pulmonary infiltrates 07/01/2022   Acute respiratory failure with hypoxia (HCC) 07/01/2022   Hyponatremia 06/29/2022   History of thoracic surgery 06/29/2022   Acute DVT of left tibial vein (HCC) 06/21/2022   Hypokalemia 06/21/2022   Vitamin D deficiency 05/30/2022   Other constipation 05/29/2022   Pathologic compression fracture of spine, initial encounter (HCC) 05/29/2022   Cord compression (HCC) 05/28/2022   Degenerative cervical disc 05/14/2022   Hypercoagulable state due to persistent atrial fibrillation (HCC) 01/11/2022   Multiple myeloma (HCC) 12/10/2021   Medication monitoring encounter 02/27/2021   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   Status post peripherally inserted central catheter (PICC) central line placement 11/06/2020   Drug rash with eosinophilia and systemic symptoms syndrome 11/06/2020   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   PAF (paroxysmal atrial fibrillation) (HCC)    GERD (gastroesophageal reflux disease) 03/29/2018   Hiatal hernia 03/29/2018   History of DVT (deep vein thrombosis) 03/29/2018   CAP (community acquired pneumonia) 03/29/2018   Osteoarthritis 03/29/2018   Neuropathy 08/29/2017   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   Paroxysmal atrial fibrillation with rapid ventricular response (HCC) 04/05/2017   Prolapsed internal hemorrhoids, grade 4, s/p ligation/pexy/hemorrhoidectomy x 2 02/24/2017 02/24/2017   OSA (obstructive sleep apnea) 07/26/2016   Obesity (BMI 30-39.9) 07/26/2016   Atrial fibrillation with RVR (HCC)    Essential hypertension    Chest pain at rest     PCP: Eleanora Neighbor, MD  REFERRING PROVIDER: Antony Blackbird, MD  REFERRING DIAG: Multiple Myeloma  THERAPY DIAG:  Compression fracture of thoracic vertebra, unspecified thoracic vertebral level, initial encounter (HCC)  Multiple myeloma not having achieved remission (HCC)  Muscle weakness (generalized)  Abnormal posture  Neck  pain  ONSET DATE: 2023  Rationale for Evaluation and Treatment: Rehabilitation  SUBJECTIVE:  SUBJECTIVE STATEMENT: I did well after last visit here and in the pool. HR presently 78, 02 98 watch showing Afib. Pt reports his cardiologist says its fine for him to exercise with  AFib. My legs are feeling a little stronger. My back and shoulders are more painful with sitting still. I can go up and down stairs with reciprocal gait now but it is still hard.  PERTINENT HISTORY:  ELOY LOTSPEICH 72 y.o. male with medical history significant for IgA Kappa Multiple Myeloma. He was diagnosed with smouldering myeloma in 2018 . Last July he developed right arm pain due to a pathologic fx and he underwent radiation and fixation. In early Feb 2024 he had radiation for pathologic T2 compression fx followed by a T1-T4 fusion. PAIN:  Are you having pain? PAIN:  Are you having pain? Not right now NPRS scale: 2/10 Pain location: shoulder blades and neck, bilateral thoracic paraspinals Pain orientation: Bilateral  PAIN TYPE: aching and tight Pain description: constant  Aggravating factors: getting up and down, looking down Relieving factors: sleeping, laying down.   PRECAUTIONS: Multiple Myeloma with mets to R ribs, R humerus with ORIF for pathological fracture 12/17/21. Lesions have also been found in R rib and also in his skull. Pathologic fx T2 with fusion T1-4 on 06/04/22, Bilateral LE edema (on Lasix), Afib   WEIGHT BEARING RESTRICTIONS: No  FALLS:  Has patient fallen in last 6 months? Yes. Number of falls 1  LIVING ENVIRONMENT: Lives with: lives with their family and lives with their spouse Lives in: House/apartment Stairs: Yes; Internal: 14 steps; on left going up and External: 3 steps; on right going up Has  following equipment at home: Single point cane and Walker - 4 wheeled  OCCUPATION: attorney/ mostly retired?  LEISURE: golf (can't do now.), use to play racquetball  HAND DOMINANCE: right   PRIOR LEVEL OF FUNCTION: Independent  POSTURE:significant slump sitting posture with forward head  PATIENT GOALS: decrease pain,get stronger,improve balance   OBJECTIVE:  COGNITION: Overall cognitive status: Within functional limits for tasks assessed   PALPATION: Very tender bilateral UT/levator, scapular regions  OBSERVATIONS / OTHER ASSESSMENTS: very flexed head and neck with chin nearly at chest  SENSATION: Light touch: Appears intact    POSTURE: significant forward head, round shoulders, increased kyphosis   Gait holding single point cane in left hand but not using. Right shoulder low, flexed neck;difficulty raising head to see where he is going  UPPER EXTREMITY AROM/PROM:  A/PROM RIGHT   eval   Shoulder extension   Shoulder flexion 91 with compensation  Shoulder abduction 67 with compensation  Shoulder internal rotation   Shoulder external rotation C6    (Blank rows = not tested)  A/PROM LEFT   eval  Shoulder extension   Shoulder flexion 123  Shoulder abduction 112  Shoulder internal rotation T7  Shoulder external rotation t3    (Blank rows = not tested)  CERVICAL AROM: All within normal limits:    Percent limited  Flexion Fully flexed  Extension From flexed position decreased 80%  Right lateral flexion   Left lateral flexion   Right rotation Dec 50% sitting in FH posture  Left rotation        Dec 50%    UPPER EXTREMITY STRENGTH:    LOWER EXTREMITY AROM/PROM:  A/PROM Right eval  Hip flexion   Hip extension   Hip abduction   Hip adduction   Hip internal rotation   Hip external rotation   Knee flexion  Knee extension   Ankle dorsiflexion   Ankle plantarflexion   Ankle inversion   Ankle eversion    (Blank rows = not tested)  A/PROM  LEFT eval  Hip flexion   Hip extension   Hip abduction   Hip adduction   Hip internal rotation   Hip external rotation   Knee flexion   Knee extension   Ankle dorsiflexion   Ankle plantarflexion   Ankle inversion   Ankle eversion    (Blank rows = not tested)  LOWER EXTREMITY MMT:   LYMPHEDEMA ASSESSMENTS:   SURGERY TYPE/DATE: 8/17/23Left prox humerus fixation for pathologic fx  2/2/2024Fusion T1-T4 for pathologic fx with cord compression 06/04/22 posterior cervical fusion/foraminotomy;T1-4 fusion NUMBER OF LYMPH NODES REMOVED: NA  CHEMOTHERAPY: Velcade/promalyst/Dex;  RADIATION:to prox humerus and thoracic spine pathologic fx  HORMONE TREATMENT: NA  INFECTIONS: NA  FUNCTIONAL TESTS:    GAIT: Distance walked: 20 Assistive device utilized: carried SPC in left Level of assistance: Complete Independence Comments: feels unsteady, very flexed neck posture   QUICK DASH SURVEY:    TODAY'S TREATMENT:                                                                                                                                         DATE:  11/01/22  NuStep Level 5 seat 12, UE 7 x 4 mins; HR 70-80, allowed to rest;repeated x 4 min HR 70-100 intermittently., 687, RPE 3 Pt in gait belt Heel raises x 15 light HH Marching x 10 B intermittent light HH Sidestepping in bars x 4 laps RPE 1, HR 81-111 Step ups on ax forward and sideways x 10 with CGA but no LOB Instructed and demonstrated to pt supine to and from sit on mat table Bridging 2 x 5  Supine cervical retraction into pillow with PT hands guiding x 10 STM to bil UT/upper scap region and cervical muscles and bilateral paraspinal muscles in prop sitting with cocoa butter 10/29/22: Pt arrives for aquatic physical therapy. Treatment took place in 3.5-5.5 feet of water. Water temperature was 90 degrees F. Pt entered the pool via stairs, step to step slowly with heavy use of rails.Pt requires buoyancy of water for support and  to offload joints with strengthening exercises.   Seated water bench with 75% submersion Pt performed seated LE AROM exercises 20x in all planes, while discussing current status/pain/symptoms/and how we should modify todays treatment. 75% depth water walking with bar bell for postural support. VC to lift chin for one length/relax muscles walking back. 6x in each direction. Static stance unassisted: mitten hand arm presses frwd/bk 10x. Small step forward/wide tandem stance 15 sec 2x Bil. CGA. Vc to lift chin. VC to lift chin. Horizontal decompression float with 100% flotation and PTA providing lateral sway to mobilize trunk and soft tissue work along the scar.    10/28/2022  NuStep Level 5 seat 12/UE 7 x  8 mins;  Pt in gait belt Heel raises x 15 light HH Marching intermittent light HH Sidestepping 4 laps in bars with no HH, but CGA of PT Forward stepping on ax x 10 ea, LAQ 3# 2 x 10 ea 02 98, HR 122 STM to bil UT/upper scap region and cervical muscles and bilateral paraspinal muscles in prop sitting with cocoa butter VC required throughout for proper posture/head position At end of rx 02 98, HR varying between 112 and 60  10/20/22:Pt arrives for aquatic physical therapy. Treatment took place in 3.5-5.5 feet of water. Water temperature was 90 degrees F. Pt entered the pool via stairs, step to step slowly with heavy use of rails.Pt requires buoyancy of water for support and to offload joints with strengthening exercises.   Seated water bench with 75% submersion Pt performed seated LE AROM exercises 20x in all planes, while discussing current status/pain/symptoms/and how we should modify todays treatment. 75% depth water walking with bar bell for postural support. VC to lift chin for one length/relax muscles walking back. 6x in each direction. Pt demonstrates less unsteadiness today. Static stance unassisted: mitten hand arm presses frwd/bk 10x. Pt unsteady. Vc to lift chin. Single buoy UE weights  for horizontal abd/add 10x. VC to lift chin. Horizontal decompression float with 100% flotation and PTA providing lateral sway to mobilize trunk and soft tissue work along the scar.   10/18/2022 NuStep Level 5 seat 12/UE 7 x 8 mins; 569 steps In parallel bars: Standing heel raises x 12 light hand touch, marching light hand touch 2 x 10 Sidestepping 3 lengths and return intermittent light touch Sit to stand 2 x 5 with 1 hand A Step ups on ax x 10 ea forward and sideways B Vector reaches 2 x 5  B Ambulation on beam forward 6 lengths and sideways 6 length;no HH to light HH STM to bil UT/upper scap region and cervical muscles and bilateral paraspinal muscles in prop sitting with cocoa butter VC required throughout for proper posture/head position   PATIENT EDUCATION:  Education details: sitting posture with lumbar roll Person educated: Patient Education method: Medical illustrator Education comprehension: verbalized understanding and returned demonstration  HOME EXERCISE PROGRAM: Doing HEP from HHPT; cervical ROM, shoulder rolls, shrugs, scap retraction, UT stretches  ASSESSMENT:  CLINICAL IMPRESSION:  Pt has been having some episodes of Afib and tolerance to exs was monitored with RPE staying between 1 and 3 today and HR going over 100 only 1 occasion, but very variable jumping around from 70 -111 but never staying in the one hundreds. He has been compliant with his HEP for improving his posture and for strengthening. He continues to have complaints of upper back discomfort/tightness since his surgery and difficulty getting his head to an erect posture.  He showed improvement with sidestepping and stepping on ax today with better balance and technique.[name] He demonstrates improved strength by indicating he can now go up and down stairs with reciprocal gait, although it is still difficult. He will continue to benefit from skilled therapy to address deficits and return pt to a more  active and safe lifestyle.  OBJECTIVE IMPAIRMENTS: decreased activity tolerance, decreased balance, difficulty walking, decreased ROM, decreased strength, increased edema, impaired UE functional use, postural dysfunction, and pain.   ACTIVITY LIMITATIONS: carrying, lifting, dressing, reach over head, and locomotion level  PARTICIPATION LIMITATIONS: cleaning, laundry, driving, and occupation  PERSONAL FACTORS:  Multiple Myeloma  are also affecting patient's functional outcome.   REHAB POTENTIAL: Good  CLINICAL DECISION MAKING: Evolving/moderate complexity  EVALUATION COMPLEXITY: Moderate  GOALS: Goals reviewed with patient? Yes  SHORT TERM GOALS: Target date: 10/21/22  Pt will be educated in and independent in proper sitting posture using lumbar roll prn Baseline: Goal status; ONGOING, using for exercises but encouraged to use for daily activities ie eating, general sitting when possible 2.  Pt will report decreased muscle soreness in bilateral upper back x 25% Baseline:  Goal status: ONGOING  3.  Pt will be compliant with a HEP for postural strengthening/ROM Baseline:  Goal status: MET  LONG TERM GOALS: Target date: 11/04/22  Pt will be able to bring his head to neutral position for safety with ambulation/driving Baseline:  Goal status: INITIAL  2.  Pt will have decreased upper back pain by 50% or more Baseline:  Goal status: INITIAL  3.  Pt will be able to flex right shoulder to 115 degrees with minimal compensation for improved reaching ability Baseline:  Goal status: INITIAL 4; Pt will be able to maintain tandem stance on both sides x 10 sec to demonstrated improved balance. Goal status:NEW   PT FREQUENCY: 2x/week  PT DURATION: 6 weeks  PLANNED INTERVENTIONS: Therapeutic exercises, Therapeutic activity, Neuromuscular re-education, Balance training, Gait training, Patient/Family education, Self Care, Joint mobilization, Aquatic Therapy, Manual therapy, and  Re-evaluation  PLAN FOR NEXT SESSION: Cont land and aquatic exercises working on posture, strength, balance, STM prn for pain Alvira Monday, PT 11/01/22 1:17 PM

## 2022-11-03 ENCOUNTER — Other Ambulatory Visit: Payer: Self-pay | Admitting: Hematology and Oncology

## 2022-11-03 ENCOUNTER — Inpatient Hospital Stay: Payer: Medicare Other | Attending: Hematology and Oncology

## 2022-11-03 ENCOUNTER — Inpatient Hospital Stay: Payer: Medicare Other

## 2022-11-03 ENCOUNTER — Other Ambulatory Visit: Payer: Self-pay

## 2022-11-03 ENCOUNTER — Encounter: Payer: Self-pay | Admitting: Internal Medicine

## 2022-11-03 VITALS — BP 153/87 | HR 60 | Temp 98.2°F | Resp 16 | Ht 70.0 in | Wt 251.0 lb

## 2022-11-03 DIAGNOSIS — I480 Paroxysmal atrial fibrillation: Secondary | ICD-10-CM | POA: Diagnosis not present

## 2022-11-03 DIAGNOSIS — E876 Hypokalemia: Secondary | ICD-10-CM | POA: Diagnosis not present

## 2022-11-03 DIAGNOSIS — M47816 Spondylosis without myelopathy or radiculopathy, lumbar region: Secondary | ICD-10-CM | POA: Diagnosis not present

## 2022-11-03 DIAGNOSIS — J90 Pleural effusion, not elsewhere classified: Secondary | ICD-10-CM | POA: Diagnosis not present

## 2022-11-03 DIAGNOSIS — N200 Calculus of kidney: Secondary | ICD-10-CM | POA: Insufficient documentation

## 2022-11-03 DIAGNOSIS — K5730 Diverticulosis of large intestine without perforation or abscess without bleeding: Secondary | ICD-10-CM | POA: Insufficient documentation

## 2022-11-03 DIAGNOSIS — Z818 Family history of other mental and behavioral disorders: Secondary | ICD-10-CM | POA: Insufficient documentation

## 2022-11-03 DIAGNOSIS — K429 Umbilical hernia without obstruction or gangrene: Secondary | ICD-10-CM | POA: Insufficient documentation

## 2022-11-03 DIAGNOSIS — M5117 Intervertebral disc disorders with radiculopathy, lumbosacral region: Secondary | ICD-10-CM | POA: Diagnosis not present

## 2022-11-03 DIAGNOSIS — Z7901 Long term (current) use of anticoagulants: Secondary | ICD-10-CM | POA: Insufficient documentation

## 2022-11-03 DIAGNOSIS — H532 Diplopia: Secondary | ICD-10-CM | POA: Insufficient documentation

## 2022-11-03 DIAGNOSIS — C9000 Multiple myeloma not having achieved remission: Secondary | ICD-10-CM | POA: Diagnosis not present

## 2022-11-03 DIAGNOSIS — G4733 Obstructive sleep apnea (adult) (pediatric): Secondary | ICD-10-CM | POA: Diagnosis not present

## 2022-11-03 DIAGNOSIS — Z86718 Personal history of other venous thrombosis and embolism: Secondary | ICD-10-CM | POA: Insufficient documentation

## 2022-11-03 DIAGNOSIS — K7689 Other specified diseases of liver: Secondary | ICD-10-CM | POA: Insufficient documentation

## 2022-11-03 DIAGNOSIS — K5900 Constipation, unspecified: Secondary | ICD-10-CM | POA: Insufficient documentation

## 2022-11-03 DIAGNOSIS — Z515 Encounter for palliative care: Secondary | ICD-10-CM | POA: Insufficient documentation

## 2022-11-03 DIAGNOSIS — K862 Cyst of pancreas: Secondary | ICD-10-CM | POA: Insufficient documentation

## 2022-11-03 DIAGNOSIS — I700 Atherosclerosis of aorta: Secondary | ICD-10-CM | POA: Insufficient documentation

## 2022-11-03 DIAGNOSIS — M50321 Other cervical disc degeneration at C4-C5 level: Secondary | ICD-10-CM | POA: Insufficient documentation

## 2022-11-03 DIAGNOSIS — M542 Cervicalgia: Secondary | ICD-10-CM | POA: Diagnosis not present

## 2022-11-03 DIAGNOSIS — Z7961 Long term (current) use of immunomodulator: Secondary | ICD-10-CM | POA: Insufficient documentation

## 2022-11-03 DIAGNOSIS — M48061 Spinal stenosis, lumbar region without neurogenic claudication: Secondary | ICD-10-CM | POA: Insufficient documentation

## 2022-11-03 DIAGNOSIS — R600 Localized edema: Secondary | ICD-10-CM | POA: Insufficient documentation

## 2022-11-03 DIAGNOSIS — K4020 Bilateral inguinal hernia, without obstruction or gangrene, not specified as recurrent: Secondary | ICD-10-CM | POA: Diagnosis not present

## 2022-11-03 DIAGNOSIS — M5126 Other intervertebral disc displacement, lumbar region: Secondary | ICD-10-CM | POA: Insufficient documentation

## 2022-11-03 DIAGNOSIS — M533 Sacrococcygeal disorders, not elsewhere classified: Secondary | ICD-10-CM | POA: Insufficient documentation

## 2022-11-03 DIAGNOSIS — Z79899 Other long term (current) drug therapy: Secondary | ICD-10-CM | POA: Insufficient documentation

## 2022-11-03 DIAGNOSIS — Z8249 Family history of ischemic heart disease and other diseases of the circulatory system: Secondary | ICD-10-CM | POA: Insufficient documentation

## 2022-11-03 DIAGNOSIS — M5136 Other intervertebral disc degeneration, lumbar region: Secondary | ICD-10-CM | POA: Insufficient documentation

## 2022-11-03 DIAGNOSIS — Z823 Family history of stroke: Secondary | ICD-10-CM | POA: Insufficient documentation

## 2022-11-03 DIAGNOSIS — R21 Rash and other nonspecific skin eruption: Secondary | ICD-10-CM | POA: Insufficient documentation

## 2022-11-03 DIAGNOSIS — M546 Pain in thoracic spine: Secondary | ICD-10-CM | POA: Insufficient documentation

## 2022-11-03 DIAGNOSIS — M47812 Spondylosis without myelopathy or radiculopathy, cervical region: Secondary | ICD-10-CM | POA: Insufficient documentation

## 2022-11-03 DIAGNOSIS — R1310 Dysphagia, unspecified: Secondary | ICD-10-CM | POA: Insufficient documentation

## 2022-11-03 DIAGNOSIS — I10 Essential (primary) hypertension: Secondary | ICD-10-CM | POA: Diagnosis not present

## 2022-11-03 DIAGNOSIS — Z5112 Encounter for antineoplastic immunotherapy: Secondary | ICD-10-CM | POA: Insufficient documentation

## 2022-11-03 DIAGNOSIS — M2578 Osteophyte, vertebrae: Secondary | ICD-10-CM | POA: Insufficient documentation

## 2022-11-03 DIAGNOSIS — Z9089 Acquired absence of other organs: Secondary | ICD-10-CM | POA: Insufficient documentation

## 2022-11-03 DIAGNOSIS — R001 Bradycardia, unspecified: Secondary | ICD-10-CM | POA: Diagnosis not present

## 2022-11-03 DIAGNOSIS — Z881 Allergy status to other antibiotic agents status: Secondary | ICD-10-CM | POA: Insufficient documentation

## 2022-11-03 DIAGNOSIS — Z923 Personal history of irradiation: Secondary | ICD-10-CM | POA: Insufficient documentation

## 2022-11-03 LAB — CBC WITH DIFFERENTIAL (CANCER CENTER ONLY)
Abs Immature Granulocytes: 0.02 10*3/uL (ref 0.00–0.07)
Basophils Absolute: 0 10*3/uL (ref 0.0–0.1)
Basophils Relative: 0 %
Eosinophils Absolute: 0 10*3/uL (ref 0.0–0.5)
Eosinophils Relative: 1 %
HCT: 37.5 % — ABNORMAL LOW (ref 39.0–52.0)
Hemoglobin: 11.7 g/dL — ABNORMAL LOW (ref 13.0–17.0)
Immature Granulocytes: 1 %
Lymphocytes Relative: 6 %
Lymphs Abs: 0.3 10*3/uL — ABNORMAL LOW (ref 0.7–4.0)
MCH: 30 pg (ref 26.0–34.0)
MCHC: 31.2 g/dL (ref 30.0–36.0)
MCV: 96.2 fL (ref 80.0–100.0)
Monocytes Absolute: 0.2 10*3/uL (ref 0.1–1.0)
Monocytes Relative: 4 %
Neutro Abs: 3.8 10*3/uL (ref 1.7–7.7)
Neutrophils Relative %: 88 %
Platelet Count: 190 10*3/uL (ref 150–400)
RBC: 3.9 MIL/uL — ABNORMAL LOW (ref 4.22–5.81)
RDW: 16.1 % — ABNORMAL HIGH (ref 11.5–15.5)
WBC Count: 4.3 10*3/uL (ref 4.0–10.5)
nRBC: 0 % (ref 0.0–0.2)

## 2022-11-03 LAB — CMP (CANCER CENTER ONLY)
ALT: 21 U/L (ref 0–44)
AST: 13 U/L — ABNORMAL LOW (ref 15–41)
Albumin: 3.8 g/dL (ref 3.5–5.0)
Alkaline Phosphatase: 105 U/L (ref 38–126)
Anion gap: 6 (ref 5–15)
BUN: 17 mg/dL (ref 8–23)
CO2: 32 mmol/L (ref 22–32)
Calcium: 9.1 mg/dL (ref 8.9–10.3)
Chloride: 102 mmol/L (ref 98–111)
Creatinine: 0.78 mg/dL (ref 0.61–1.24)
GFR, Estimated: >60 mL/min (ref >60)
Glucose, Bld: 167 mg/dL — ABNORMAL HIGH (ref 70–99)
Potassium: 3.7 mmol/L (ref 3.5–5.1)
Sodium: 140 mmol/L (ref 135–145)
Total Bilirubin: 0.5 mg/dL (ref 0.3–1.2)
Total Protein: 5.8 g/dL — ABNORMAL LOW (ref 6.5–8.1)

## 2022-11-03 LAB — LACTATE DEHYDROGENASE: LDH: 166 U/L (ref 98–192)

## 2022-11-03 MED ORDER — BORTEZOMIB CHEMO SQ INJECTION 3.5 MG (2.5MG/ML)
1.3000 mg/m2 | Freq: Once | INTRAMUSCULAR | Status: AC
  Administered 2022-11-03: 3 mg via SUBCUTANEOUS
  Filled 2022-11-03: qty 1.2

## 2022-11-03 MED ORDER — METOPROLOL SUCCINATE ER 50 MG PO TB24: ORAL_TABLET | ORAL | 3 refills | Status: DC

## 2022-11-03 NOTE — Telephone Encounter (Signed)
I would keep on amiodarone  Take metoprolol with you to take as needed for faster rates  Can communicate via my chart if needed  I hope he has a good trip to [place]

## 2022-11-03 NOTE — Patient Instructions (Signed)
[facility]  Discharge Instructions: Thank you for choosing [facility] to provide your oncology and hematology care.   If you have a lab appointment with the Cancer Center, please go directly to the Cancer Center and check in at the registration area.   Wear comfortable clothing and clothing appropriate for easy access to any Portacath or PICC line.   We strive to give you quality time with your provider. You may need to reschedule your appointment if you arrive late (15 or more minutes).  Arriving late affects you and other patients whose appointments are after yours.  Also, if you miss three or more appointments without notifying the office, you may be dismissed from the clinic at the provider's discretion.      For prescription refill requests, have your pharmacy contact our office and allow 72 hours for refills to be completed.    Today you received the following chemotherapy and/or immunotherapy agents: bortezomib      To help prevent nausea and vomiting after your treatment, we encourage you to take your nausea medication as directed.  BELOW ARE SYMPTOMS THAT SHOULD BE REPORTED IMMEDIATELY: *FEVER GREATER THAN 100.4 F (38 C) OR HIGHER *CHILLS OR SWEATING *NAUSEA AND VOMITING THAT IS NOT CONTROLLED WITH YOUR NAUSEA MEDICATION *UNUSUAL SHORTNESS OF BREATH *UNUSUAL BRUISING OR BLEEDING *URINARY PROBLEMS (pain or burning when urinating, or frequent urination) *BOWEL PROBLEMS (unusual diarrhea, constipation, pain near the anus) TENDERNESS IN MOUTH AND THROAT WITH OR WITHOUT PRESENCE OF ULCERS (sore throat, sores in mouth, or a toothache) UNUSUAL RASH, SWELLING OR PAIN  UNUSUAL VAGINAL DISCHARGE OR ITCHING   Items with * indicate a potential emergency and should be followed up as soon as possible or go to the Emergency Department if any problems should occur.  Please show the CHEMOTHERAPY ALERT CARD or IMMUNOTHERAPY ALERT CARD at  check-in to the Emergency Department and triage nurse.  Should you have questions after your visit or need to cancel or reschedule your appointment, please contact [facility]  Dept: [phone]  and follow the prompts.  Office hours are 8:00 a.m. to 4:30 p.m. Monday - Friday. Please note that voicemails left after 4:00 p.m. may not be returned until the following business day.  We are closed weekends and major holidays. You have access to a nurse at all times for urgent questions. Please call the main number to the clinic Dept: [phone] and follow the prompts.   For any non-urgent questions, you may also contact your provider using MyChart. We now offer e-Visits for anyone 18 and older to request care online for non-urgent symptoms. For details visit [email or web address].   Also download the MyChart app! Go to the app store, search "MyChart", open the app, select [facility], and log in with your MyChart username and password.   

## 2022-11-03 NOTE — Telephone Encounter (Signed)
Pt says he has a lot of Toprol XL at home.. he will take the next couple days until he leaves and take PRN thereafter.

## 2022-11-04 NOTE — Therapy (Deleted)
OUTPATIENT PHYSICAL THERAPY ONCOLOGY TREATMENT  Patient Name: Travis [name] MRN: [id number] DOB:01-27-1951, 72 y.o., male Today's Date: 11/01/2022   END OF SESSION:  PT End of Session - 11/01/22 1102     Visit Number 10    Number of Visits 16    Date for PT Re-Evaluation 11/04/22    PT Start Time 1103    PT Stop Time 1200    PT Time Calculation (min) 57 min    Equipment Utilized During Treatment Gait belt    Activity Tolerance Patient tolerated treatment well    Behavior During Therapy WFL for tasks assessed/performed              Past Medical History:  Diagnosis Date   A-fib (HCC)    Arthritis    "comes and goes; mostly in hands, occasionally elbow" (04/05/2017)   Complication of anesthesia    "just wanted to sleep alot  for couple days S/P VARICOCELE OR" (04/05/2017)   DVT (deep venous thrombosis) (HCC)    LLE   Dysrhythmia    PVCs    GERD (gastroesophageal reflux disease)    History of hiatal hernia    History of radiation therapy    Thoracic Spine- 07/13/22-07/23/22-Dr. Antony Blackbird   Hypertension    Impaired glucose tolerance    Multiple myeloma (HCC)    Obesity (BMI 30-39.9) 07/26/2016   OSA on CPAP 07/26/2016   Mild with AHI 12.5/hr now on CPAP at 14cm H2O   PAF (paroxysmal atrial fibrillation) (HCC)    s/p PVI Ablation in 2018 // Flecainide Rx // Echo 8/21: EF 60-65, no RWMA, mild LVH, normal RV SF, moderate RVE, mild BAE, trivial MR, mild dilation of aortic root (40 mm)   Pneumonia    "may have had walking pneumonia a few years ago" (04/05/2017)   Pre-diabetes    Thrombophlebitis    Past Surgical History:  Procedure Laterality Date   ATRIAL FIBRILLATION ABLATION  04/05/2017   ATRIAL FIBRILLATION ABLATION N/A 04/05/2017   Procedure: ATRIAL FIBRILLATION ABLATION;  Surgeon: [name], MD;  Location: [facility];  Service: Cardiovascular;  Laterality: N/A;   BONE BIOPSY Right 12/17/2021   Procedure: BONE BIOPSY;  Surgeon: [name], MD;   Location: [facility];  Service: Orthopedics;  Laterality: Right;   COMPLETE RIGHT HIP REPLACMENT  01/19/2019   EVALUATION UNDER ANESTHESIA WITH HEMORRHOIDECTOMY N/A 02/24/2017   Procedure: EXAM UNDER ANESTHESIA WITH  HEMORRHOIDECTOMY;  Surgeon: [name], MD;  Location: [facility];  Service: General;  Laterality: N/A;   EXCISIONAL TOTAL HIP ARTHROPLASTY WITH ANTIBIOTIC SPACERS Right 09/25/2020   Procedure: EXCISIONAL TOTAL HIP ARTHROPLASTY WITH ANTIBIOTIC SPACERS;  Surgeon: [name], MD;  Location: [facility];  Service: Orthopedics;  Laterality: Right;   FLEXIBLE SIGMOIDOSCOPY N/A 04/15/2015   Procedure:  UNSEDATED FLEXIBLE SIGMOIDOSCOPY;  Surgeon: [name], MD;  Location: [facility];  Service: Endoscopy;  Laterality: N/A;   HUMERUS IM NAIL Right 12/17/2021   Procedure: INTRAMEDULLARY (IM) NAIL HUMERAL;  Surgeon: [name], MD;  Location: [facility];  Service: Orthopedics;  Laterality: Right;   KNEE ARTHROSCOPY Left ~ 2016   POSTERIOR CERVICAL FUSION/FORAMINOTOMY N/A 06/04/2022   Procedure: Thoracic one - Thoracic Four Posterior lateral arthrodesis/ fusion and Thoracic two Corpectomy;  Surgeon: [name], MD;  Location: [facility];  Service: Neurosurgery;  Laterality: N/A;   REIMPLANTATION OF TOTAL HIP Right 12/25/2020   Procedure: REIMPLANTATION/REVISION OF RIGHT TOTAL HIP VERSUS REPEAT IRRIGATION AND DEBRIDEMENT;  Surgeon: [name], MD;  Location: [facility];  Service: Orthopedics;  Laterality: Right;   TESTICLE SURGERY  1988   VARICOCELE   TONSILLECTOMY     VARICOSE VEIN SURGERY Bilateral    Patient Active Problem List   Diagnosis Date Noted   Pancreatic cyst 09/30/2022   Infection due to novel influenza A virus 09/29/2022   Persistent atrial fibrillation (HCC) 07/15/2022   Acute on chronic diastolic heart failure (HCC) 07/03/2022   Pulmonary infiltrates 07/01/2022   Acute respiratory failure with hypoxia (HCC) 07/01/2022   Hyponatremia  06/29/2022   History of thoracic surgery 06/29/2022   Acute DVT of left tibial vein (HCC) 06/21/2022   Hypokalemia 06/21/2022   Vitamin D deficiency 05/30/2022   Other constipation 05/29/2022   Pathologic compression fracture of spine, initial encounter (HCC) 05/29/2022   Cord compression (HCC) 05/28/2022   Degenerative cervical disc 05/14/2022   Hypercoagulable state due to persistent atrial fibrillation (HCC) 01/11/2022   Multiple myeloma (HCC) 12/10/2021   Medication monitoring encounter 02/27/2021   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   Status post peripherally inserted central catheter (PICC) central line placement 11/06/2020   Drug rash with eosinophilia and systemic symptoms syndrome 11/06/2020   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   PAF (paroxysmal atrial fibrillation) (HCC)    GERD (gastroesophageal reflux disease) 03/29/2018   Hiatal hernia 03/29/2018   History of DVT (deep vein thrombosis) 03/29/2018   CAP (community acquired pneumonia) 03/29/2018   Osteoarthritis 03/29/2018   Neuropathy 08/29/2017   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   Paroxysmal atrial fibrillation with rapid ventricular response (HCC) 04/05/2017   Prolapsed internal hemorrhoids, grade 4, s/p ligation/pexy/hemorrhoidectomy x 2 02/24/2017 02/24/2017   OSA (obstructive sleep apnea) 07/26/2016   Obesity (BMI 30-39.9) 07/26/2016   Atrial fibrillation with RVR (HCC)    Essential hypertension    Chest pain at rest     PCP: Eleanora Neighbor, MD  REFERRING PROVIDER: Antony Blackbird, MD  REFERRING DIAG: Multiple Myeloma  THERAPY DIAG:  Compression fracture of thoracic vertebra, unspecified thoracic vertebral level, initial encounter (HCC)  Multiple myeloma not having achieved remission (HCC)  Muscle weakness (generalized)  Abnormal posture  Neck pain  ONSET DATE: 2023  Rationale for Evaluation and Treatment: Rehabilitation  SUBJECTIVE:                                                                                                                                                                                            SUBJECTIVE STATEMENT:   PERTINENT HISTORY:  Travis [name] 72 y.o. male with medical history significant for IgA Kappa Multiple Myeloma. He  was diagnosed with smouldering myeloma in 2018 . Last July he developed right arm pain due to a pathologic fx and he underwent radiation and fixation. In early Feb 2024 he had radiation for pathologic T2 compression fx followed by a T1-T4 fusion. PAIN:  Are you having pain? PAIN:  Are you having pain? Not right now NPRS scale: 2/10 Pain location: shoulder blades and neck, bilateral thoracic paraspinals Pain orientation: Bilateral  PAIN TYPE: aching and tight Pain description: constant  Aggravating factors: getting up and down, looking down Relieving factors: sleeping, laying down.   PRECAUTIONS: Multiple Myeloma with mets to R ribs, R humerus with ORIF for pathological fracture 12/17/21. Lesions have also been found in R rib and also in his skull. Pathologic fx T2 with fusion T1-4 on 06/04/22, Bilateral LE edema (on Lasix), Afib   WEIGHT BEARING RESTRICTIONS: No  FALLS:  Has patient fallen in last 6 months? Yes. Number of falls 1  LIVING ENVIRONMENT: Lives with: lives with their family and lives with their spouse Lives in: House/apartment Stairs: Yes; Internal: 14 steps; on left going up and External: 3 steps; on right going up Has following equipment at home: Single point cane and Walker - 4 wheeled  OCCUPATION: attorney/ mostly retired?  LEISURE: golf (can't do now.), use to play racquetball  HAND DOMINANCE: right   PRIOR LEVEL OF FUNCTION: Independent  POSTURE:significant slump sitting posture with forward head  PATIENT GOALS: decrease pain,get stronger,improve balance   OBJECTIVE:  COGNITION: Overall cognitive status: Within functional limits for tasks  assessed   PALPATION: Very tender bilateral UT/levator, scapular regions  OBSERVATIONS / OTHER ASSESSMENTS: very flexed head and neck with chin nearly at chest  SENSATION: Light touch: Appears intact    POSTURE: significant forward head, round shoulders, increased kyphosis   Gait holding single point cane in left hand but not using. Right shoulder low, flexed neck;difficulty raising head to see where he is going  UPPER EXTREMITY AROM/PROM:  A/PROM RIGHT   eval   Shoulder extension   Shoulder flexion 91 with compensation  Shoulder abduction 67 with compensation  Shoulder internal rotation   Shoulder external rotation C6    (Blank rows = not tested)  A/PROM LEFT   eval  Shoulder extension   Shoulder flexion 123  Shoulder abduction 112  Shoulder internal rotation T7  Shoulder external rotation t3    (Blank rows = not tested)  CERVICAL AROM: All within normal limits:    Percent limited  Flexion Fully flexed  Extension From flexed position decreased 80%  Right lateral flexion   Left lateral flexion   Right rotation Dec 50% sitting in FH posture  Left rotation        Dec 50%    UPPER EXTREMITY STRENGTH:    LOWER EXTREMITY AROM/PROM:  A/PROM Right eval  Hip flexion   Hip extension   Hip abduction   Hip adduction   Hip internal rotation   Hip external rotation   Knee flexion   Knee extension   Ankle dorsiflexion   Ankle plantarflexion   Ankle inversion   Ankle eversion    (Blank rows = not tested)  A/PROM LEFT eval  Hip flexion   Hip extension   Hip abduction   Hip adduction   Hip internal rotation   Hip external rotation   Knee flexion   Knee extension   Ankle dorsiflexion   Ankle plantarflexion   Ankle inversion   Ankle eversion    (Blank rows = not  tested)  LOWER EXTREMITY MMT:   LYMPHEDEMA ASSESSMENTS:   SURGERY TYPE/DATE: 8/17/23Left prox humerus fixation for pathologic fx  2/2/2024Fusion T1-T4 for pathologic fx with cord  compression 06/04/22 posterior cervical fusion/foraminotomy;T1-4 fusion NUMBER OF LYMPH NODES REMOVED: NA  CHEMOTHERAPY: Velcade/promalyst/Dex;  RADIATION:to prox humerus and thoracic spine pathologic fx  HORMONE TREATMENT: NA  INFECTIONS: NA  FUNCTIONAL TESTS:    GAIT: Distance walked: 20 Assistive device utilized: carried SPC in left Level of assistance: Complete Independence Comments: feels unsteady, very flexed neck posture   QUICK DASH SURVEY:    TODAY'S TREATMENT:                                                                                                                                         DATE:   11/05/22:Pt arrives for aquatic physical therapy. Treatment took place in 3.5-5.5 feet of water. Water temperature was 90 degrees F. Pt entered the pool via stairs, step to step slowly with heavy use of rails.Pt requires buoyancy of water for support and to offload joints with strengthening exercises.   Seated water bench with 75% submersion Pt performed seated LE AROM exercises 20x in all planes, while discussing current status/pain/symptoms/and how we should modify todays treatment. 75% depth water walking with bar bell for postural support. VC to lift chin for one length/relax muscles walking back. 6x in each direction. Static stance unassisted: mitten hand arm presses frwd/bk 10x. Small step forward/wide tandem stance 15 sec 2x Bil. CGA. Vc to lift chin. VC to lift chin. Horizontal decompression float with 100% flotation and PTA providing lateral sway to mobilize trunk and soft tissue work along the scar.   11/01/22  NuStep Level 5 seat 12, UE 7 x 4 mins; HR 70-80, allowed to rest;repeated x 4 min HR 70-100 intermittently., 687, RPE 3 Pt in gait belt Heel raises x 15 light HH Marching x 10 B intermittent light HH Sidestepping in bars x 4 laps RPE 1, HR 81-111 Step ups on ax forward and sideways x 10 with CGA but no LOB Instructed and demonstrated to pt supine to and  from sit on mat table Bridging 2 x 5  Supine cervical retraction into pillow with PT hands guiding x 10 STM to bil UT/upper scap region and cervical muscles and bilateral paraspinal muscles in prop sitting with cocoa butter 10/29/22: Pt arrives for aquatic physical therapy. Treatment took place in 3.5-5.5 feet of water. Water temperature was 90 degrees F. Pt entered the pool via stairs, step to step slowly with heavy use of rails.Pt requires buoyancy of water for support and to offload joints with strengthening exercises.   Seated water bench with 75% submersion Pt performed seated LE AROM exercises 20x in all planes, while discussing current status/pain/symptoms/and how we should modify todays treatment. 75% depth water walking with bar bell for postural support. VC to lift chin for one length/relax muscles walking  back. 6x in each direction. Static stance unassisted: mitten hand arm presses frwd/bk 10x. Small step forward/wide tandem stance 15 sec 2x Bil. CGA. Vc to lift chin. VC to lift chin. Horizontal decompression float with 100% flotation and PTA providing lateral sway to mobilize trunk and soft tissue work along the scar.    10/18/2022 NuStep Level 5 seat 12/UE 7 x 8 mins; 569 steps In parallel bars: Standing heel raises x 12 light hand touch, marching light hand touch 2 x 10 Sidestepping 3 lengths and return intermittent light touch Sit to stand 2 x 5 with 1 hand A Step ups on ax x 10 ea forward and sideways B Vector reaches 2 x 5  B Ambulation on beam forward 6 lengths and sideways 6 length;no HH to light HH STM to bil UT/upper scap region and cervical muscles and bilateral paraspinal muscles in prop sitting with cocoa butter VC required throughout for proper posture/head position   PATIENT EDUCATION:  Education details: sitting posture with lumbar roll Person educated: Patient Education method: Medical illustrator Education comprehension: verbalized understanding and  returned demonstration  HOME EXERCISE PROGRAM: Doing HEP from HHPT; cervical ROM, shoulder rolls, shrugs, scap retraction, UT stretches  ASSESSMENT:  CLINICAL IMPRESSION:    OBJECTIVE IMPAIRMENTS: decreased activity tolerance, decreased balance, difficulty walking, decreased ROM, decreased strength, increased edema, impaired UE functional use, postural dysfunction, and pain.   ACTIVITY LIMITATIONS: carrying, lifting, dressing, reach over head, and locomotion level  PARTICIPATION LIMITATIONS: cleaning, laundry, driving, and occupation  PERSONAL FACTORS:  Multiple Myeloma  are also affecting patient's functional outcome.   REHAB POTENTIAL: Good  CLINICAL DECISION MAKING: Evolving/moderate complexity  EVALUATION COMPLEXITY: Moderate  GOALS: Goals reviewed with patient? Yes  SHORT TERM GOALS: Target date: 10/21/22  Pt will be educated in and independent in proper sitting posture using lumbar roll prn Baseline: Goal status; ONGOING, using for exercises but encouraged to use for daily activities ie eating, general sitting when possible 2.  Pt will report decreased muscle soreness in bilateral upper back x 25% Baseline:  Goal status: ONGOING  3.  Pt will be compliant with a HEP for postural strengthening/ROM Baseline:  Goal status: MET  LONG TERM GOALS: Target date: 11/04/22  Pt will be able to bring his head to neutral position for safety with ambulation/driving Baseline:  Goal status: INITIAL  2.  Pt will have decreased upper back pain by 50% or more Baseline:  Goal status: INITIAL  3.  Pt will be able to flex right shoulder to 115 degrees with minimal compensation for improved reaching ability Baseline:  Goal status: INITIAL 4; Pt will be able to maintain tandem stance on both sides x 10 sec to demonstrated improved balance. Goal status:NEW   PT FREQUENCY: 2x/week  PT DURATION: 6 weeks  PLANNED INTERVENTIONS: Therapeutic exercises, Therapeutic activity,  Neuromuscular re-education, Balance training, Gait training, Patient/Family education, Self Care, Joint mobilization, Aquatic Therapy, Manual therapy, and Re-evaluation  PLAN FOR NEXT SESSION: Cont land and aquatic exercises working on posture, strength, balance, STM prn for pain  Ane Payment, PTA 11/04/22 2:18 PM

## 2022-11-05 ENCOUNTER — Ambulatory Visit: Payer: Medicare Other

## 2022-11-05 ENCOUNTER — Ambulatory Visit: Payer: Medicare Other | Admitting: Physical Therapy

## 2022-11-05 ENCOUNTER — Other Ambulatory Visit: Payer: Self-pay | Admitting: Physician Assistant

## 2022-11-05 ENCOUNTER — Encounter: Payer: Self-pay | Admitting: *Deleted

## 2022-11-05 ENCOUNTER — Encounter: Payer: Self-pay | Admitting: Physician Assistant

## 2022-11-05 ENCOUNTER — Ambulatory Visit: Payer: Medicare Other | Attending: Physician Assistant | Admitting: Physician Assistant

## 2022-11-05 VITALS — BP 110/68 | HR 45 | Ht 70.0 in | Wt 250.0 lb

## 2022-11-05 DIAGNOSIS — R001 Bradycardia, unspecified: Secondary | ICD-10-CM

## 2022-11-05 DIAGNOSIS — D472 Monoclonal gammopathy: Secondary | ICD-10-CM

## 2022-11-05 DIAGNOSIS — I4819 Other persistent atrial fibrillation: Secondary | ICD-10-CM

## 2022-11-05 DIAGNOSIS — R278 Other lack of coordination: Secondary | ICD-10-CM

## 2022-11-05 DIAGNOSIS — I480 Paroxysmal atrial fibrillation: Secondary | ICD-10-CM | POA: Diagnosis not present

## 2022-11-05 DIAGNOSIS — I5033 Acute on chronic diastolic (congestive) heart failure: Secondary | ICD-10-CM

## 2022-11-05 DIAGNOSIS — G4733 Obstructive sleep apnea (adult) (pediatric): Secondary | ICD-10-CM

## 2022-11-05 DIAGNOSIS — I10 Essential (primary) hypertension: Secondary | ICD-10-CM | POA: Diagnosis not present

## 2022-11-05 DIAGNOSIS — R293 Abnormal posture: Secondary | ICD-10-CM

## 2022-11-05 DIAGNOSIS — R600 Localized edema: Secondary | ICD-10-CM

## 2022-11-05 DIAGNOSIS — Z79899 Other long term (current) drug therapy: Secondary | ICD-10-CM

## 2022-11-05 DIAGNOSIS — G8929 Other chronic pain: Secondary | ICD-10-CM

## 2022-11-05 DIAGNOSIS — C9000 Multiple myeloma not having achieved remission: Secondary | ICD-10-CM

## 2022-11-05 DIAGNOSIS — M542 Cervicalgia: Secondary | ICD-10-CM

## 2022-11-05 DIAGNOSIS — S22000A Wedge compression fracture of unspecified thoracic vertebra, initial encounter for closed fracture: Secondary | ICD-10-CM

## 2022-11-05 DIAGNOSIS — D6869 Other thrombophilia: Secondary | ICD-10-CM | POA: Diagnosis not present

## 2022-11-05 DIAGNOSIS — M6281 Muscle weakness (generalized): Secondary | ICD-10-CM

## 2022-11-05 LAB — KAPPA/LAMBDA LIGHT CHAINS
Kappa free light chain: 44.5 mg/L — ABNORMAL HIGH (ref 3.3–19.4)
Kappa, lambda light chain ratio: 3.3 — ABNORMAL HIGH (ref 0.26–1.65)
Lambda free light chains: 13.5 mg/L (ref 5.7–26.3)

## 2022-11-05 NOTE — Patient Instructions (Addendum)
Medication Instructions:  Your provider has recommended you make the following change in your medication:  1.Hold metoprolol succinate (Toprol XL) for now  2.Take an extra 20 mg of furosemide (Lasix) to equal 60 mg daily for 3 days, then resume usual dose *If you need a refill on your cardiac medications before your next appointment, please call your pharmacy*   Lab Work: BMET, BNP-NEXT WEEK If you have labs (blood work) drawn today and your tests are completely normal, you will receive your results only by: MyChart Message (if you have MyChart) OR A paper copy in the mail If you have any lab test that is abnormal or we need to change your treatment, we will call you to review the results.   Testing/Procedures: Christena Deem- Long Term Monitor Instructions  Your physician has requested you wear a ZIO patch monitor for 14 days.  This is a single patch monitor. [employer] supplies one patch monitor per enrollment. Additional stickers are not available. Please do not apply patch if you will be having a Nuclear Stress Test,  Echocardiogram, Cardiac CT, MRI, or Chest Xray during the period you would be wearing the  monitor. The patch cannot be worn during these tests. You cannot remove and re-apply the  ZIO XT patch monitor.  Your ZIO patch monitor will be mailed 3 day USPS to your address on file. It may take 3-5 days  to receive your monitor after you have been enrolled.  Once you have received your monitor, please review the enclosed instructions. Your monitor  has already been registered assigning a specific monitor serial # to you.  Billing and Patient Assistance Program Information  We have supplied [employer] with any of your insurance information on file for billing purposes. [employer] offers a sliding scale Patient Assistance Program for patients that do not have  insurance, or whose insurance does not completely cover the cost of the ZIO monitor.  You must apply for the Patient Assistance  Program to qualify for this discounted rate.  To apply, please call [employer] at [phone], select option 4, select option 2, ask to apply for  Patient Assistance Program. Meredeth Ide will ask your household income, and how many people  are in your household. They will quote your out-of-pocket cost based on that information.  [employer] will also be able to set up a 26-month, interest-free payment plan if needed.  Applying the monitor   Shave hair from upper left chest.  Hold abrader disc by orange tab. Rub abrader in 40 strokes over the upper left chest as  indicated in your monitor instructions.  Clean area with 4 enclosed alcohol pads. Let dry.  Apply patch as indicated in monitor instructions. Patch will be placed under collarbone on left  side of chest with arrow pointing upward.  Rub patch adhesive wings for 2 minutes. Remove white label marked "1". Remove the white  label marked "2". Rub patch adhesive wings for 2 additional minutes.  While looking in a mirror, press and release button in center of patch. A small green light will  flash 3-4 times. This will be your only indicator that the monitor has been turned on.  Do not shower for the first 24 hours. You may shower after the first 24 hours.  Press the button if you feel a symptom. You will hear a small click. Record Date, Time and  Symptom in the Patient Logbook.  When you are ready to remove the patch, follow instructions on the last 2 pages  of Patient  Logbook. Stick patch monitor onto the last page of Patient Logbook.  Place Patient Logbook in the blue and white box. Use locking tab on box and tape box closed  securely. The blue and white box has prepaid postage on it. Please place it in the mailbox as  soon as possible. Your physician should have your test results approximately 7 days after the  monitor has been mailed back to [facility].  Call [facility] Customer Care at [phone] if you have questions regarding   your ZIO XT patch monitor. Call them immediately if you see an orange light blinking on your  monitor.  If your monitor falls off in less than 4 days, contact our Monitor department at [phone].  If your monitor becomes loose or falls off after 4 days call [employer] at [phone] for  suggestions on securing your monitor    Follow-Up: At [facility], you and your health needs are our priority.  As part of our continuing mission to provide you with exceptional heart care, we have created designated Provider Care Teams.  These Care Teams include your primary Cardiologist (physician) and Advanced Practice Providers (APPs -  Physician Assistants and Nurse Practitioners) who all work together to provide you with the care you need, when you need it.  Your next appointment:   Next available  Provider:   You may see Lanier Prude, MD or one of the following Advanced Practice Providers on your designated Care Team:   Francis Dowse, [place] Casimiro Needle "Mardelle Matte" [place], [place]  Also,schedule 3 month follow up with Dr [name]

## 2022-11-05 NOTE — Telephone Encounter (Signed)
Left a voicemail Based on vital signs sounds like he is in SR/SB     He had been in atrial fib, atrial flutter (10/25/22 EKG) with RVR I told him to stop metoprolol Take it easy today   HR should come up If can, have him come into [facility] for EKG

## 2022-11-05 NOTE — Progress Notes (Addendum)
Cardiology Office Note:  .   Date:  11/05/2022  ID:  Travis [name], DOB October 17, 1950, MRN [id number] PCP: Travis Aspen, MD  [facility] Providers Cardiologist:  [name], MD Electrophysiologist:  Lanier Prude, MD  Sleep Medicine:  Armanda Magic, MD {  History of Present Illness: .   Travis [name] is a 72 y.o. male with a past medical history of PAF (on Eliquis) status post PVI ablation 2018, hypertension, OSA, prior DVT, monoclonal gammopathy (smoldering myeloma), peripheral neuropathy who presents today for follow-up appointment.   Was last seen by [name], NP 10/20/2022.  Had been followed by Dr. [name] for many years for management of hypertension and atrial fibrillation.  Completed a Myoview 10/17 that was normal with no evidence of ischemia.  Diagnosed with atrial fibrillation in 2017 and underwent PVI ablation by Dr. [name] 2018.  Also completed cardiac CTA at that time showing no evidence of calcium.  Had an echocardiogram 2019 that showed LVEF 65% with no RWMA, normal diastolic function with aortic root measuring 40 mm.  Treated with Darzalex, Revlimid, and dexamethasone 12/28/2021 for multiple myeloma.  Seen in the AF clinic on 01/2022 for management of A-fib and plan for outpatient cardioversion however patient spontaneously converted.  Developed AF with RVR 03/2022 while in [place] and required diuresis.  He was seen 04/2022 for follow-up and was back in atrial flutter with no resolution with metoprolol.  He was started on amiodarone and metoprolol was cut back.  Referred to Dr. Lalla Brothers for discussion of treatment options 05/12/2022.  Amiodarone was reduced to 200 mg daily and blood pressure was noted to be above goal.  Hydralazine was increased to 37.53 times a day.  Seen in the ED 06/2022 with AF RVR after suffering a fall.  2D echo was completed with LVEF 60 to 65% with grade 2 DD.  Patient in normal sinus rhythm at that time.  Worsening dyspnea after being  discharged and was brought back to the ER via EMS.  CT scan was completed showing no evidence of PE but did reveal bilateral pneumonia with small bilateral pleural effusion.  Found to be in AF with RVR on arrival with acute CHF exacerbation.  Was loaded with amnio and diuresed.  Was seen in AF clinic 07/15/2022 with plan to pursue DCCV if sinus rhythm was not maintained with amiodarone.  He was seen by Dr. [name] 07/29/2022 and was maintaining sinus rhythm therefore DCCV was canceled.  He was seen by Dr. Ross/9/24 for evaluation of lower extremity edema.  He reported poor response to Lasix which was increased to 80 mg daily.  Was maintained on Eliquis and amiodarone.  Had addition of metolazone 2.5 mg to help with diuresis.  Contacted office shortly after complaining of elevated systolic blood pressure.  Hydralazine was increased to 50/25/50 mg daily and then switch to 75 mg 3 times daily.  He presented for follow-up with [name], NP few weeks ago.  Was seen for elevated blood pressures.  Seen in the office and patient reported experiencing elevated blood pressures with systolic readings in the 160s to 170s.  Some shortness of breath and slightly volume up during that exam.  Compliant with current medications no adverse reactions.  Denied any new supplements or sodium indiscretions in his diet.  He does note some discomfort since recovering from thoracic spine surgery.  Blood pressure was 174/82 and recheck of blood pressure was not completed until after patient's exam.  Denied any  headaches or vision changes.  Denied chest pain, palpitations, dyspnea, PND, orthopnea, nausea, vomiting, dizziness, syncope, weight gain, or early satiety.  Today, he tells me that he was not in a week and then it broke.  He has sinus bradycardia when he is out of A-fib before but typically does not last long.  Heart rate is 45 bpm today.  Patient states that he could barely get out of his chair (he went for a pedicure this morning).   He is extremely weak and tired.  Also states that he feels fluid overloaded today.  He was taking metoprolol 50 mg daily a few days in a row since he was in A-fib and then when he converted his heart rate went down to the 40s and did not recover.  He also is having issues with his back and has a herniated disc that is affecting his arms and legs.  He has follow-up for this scheduled.  Unfortunately, the patient and his wife were headed out of the country tomorrow on a cruise around [place] and we have advised that they do not travel in his current condition.  We discussed his most recent echocardiogram.  We also discussed getting a ZIO monitor as well as increasing his diuretics for short time to get his fluid level under control.  Reports no shortness of breath nor dyspnea on exertion. Reports no chest pain, pressure, or tightness. No edema, orthopnea, PND. Reports no palpitations.     ROS: Pertinent ROS in HPI  Studies Reviewed: .       Echocardiogram 09/08/2022 IMPRESSIONS     1. Left ventricular ejection fraction, by estimation, is 60 to 65%. The  left ventricle has normal function. The left ventricle has no regional  wall motion abnormalities. There is severe concentric left ventricular  hypertrophy. Left ventricular diastolic   parameters are indeterminate.   2. Right ventricular systolic function is normal. The right ventricular  size is mildly enlarged. There is normal pulmonary artery systolic  pressure.   3. Left atrial size was severely dilated.   4. Right atrial size was mildly dilated.   5. The mitral valve is normal in structure. No evidence of mitral valve  regurgitation. No evidence of mitral stenosis.   6. The aortic valve is normal in structure. Aortic valve regurgitation is  not visualized. No aortic stenosis is present.   7. There is mild dilatation of the ascending aorta, measuring 37 mm.   8. The inferior vena cava is dilated in size with <50% respiratory   variability, suggesting right atrial pressure of 15 mmHg.   FINDINGS   Left Ventricle: Left ventricular ejection fraction, by estimation, is 60  to 65%. The left ventricle has normal function. The left ventricle has no  regional wall motion abnormalities. The global longitudinal strain is  normal despite suboptimal segment  tracking. The left ventricular internal cavity size was normal in size.  There is severe concentric left ventricular hypertrophy. Left ventricular  diastolic parameters are indeterminate.   Right Ventricle: The right ventricular size is mildly enlarged. No  increase in right ventricular wall thickness. Right ventricular systolic  function is normal. There is normal pulmonary artery systolic pressure.  The tricuspid regurgitant velocity is 2.12   m/s, and with an assumed right atrial pressure of 8 mmHg, the estimated  right ventricular systolic pressure is 26.0 mmHg.   Left Atrium: Left atrial size was severely dilated.   Right Atrium: Right atrial size was mildly dilated.  Pericardium: There is no evidence of pericardial effusion. Presence of  epicardial fat layer.   Mitral Valve: The mitral valve is normal in structure. No evidence of  mitral valve regurgitation. No evidence of mitral valve stenosis.   Tricuspid Valve: The tricuspid valve is normal in structure. Tricuspid  valve regurgitation is trivial. No evidence of tricuspid stenosis.   Aortic Valve: The aortic valve is normal in structure. Aortic valve  regurgitation is not visualized. No aortic stenosis is present.   Pulmonic Valve: The pulmonic valve was not well visualized. Pulmonic valve  regurgitation is trivial. No evidence of pulmonic stenosis.   Aorta: There is mild dilatation of the ascending aorta, measuring 37 mm.   Venous: The inferior vena cava is dilated in size with less than 50%  respiratory variability, suggesting right atrial pressure of 15 mmHg.   IAS/Shunts: No atrial level  shunt detected by color flow Doppler.       Physical Exam:   VS:  BP 110/68   Pulse (!) 45   Ht 5\' 10"  (1.778 m)   Wt 250 lb (113.4 kg)   SpO2 96%   BMI 35.87 kg/m    Wt Readings from Last 3 Encounters:  11/05/22 250 lb (113.4 kg)  11/03/22 251 lb (113.9 kg)  10/27/22 248 lb 1.6 oz (112.5 kg)    GEN: Well nourished, well developed in no acute distress NECK: No JVD; No carotid bruits CARDIAC:sinus bradycardia, no murmurs, rubs, gallops RESPIRATORY:  Clear to auscultation without rales, wheezing or rhonchi  ABDOMEN: Soft, non-tender, non-distended EXTREMITIES:  No edema; No deformity   ASSESSMENT AND PLAN: .   1.  Sinus bradycardia -he is currently 45 bpm today -Hold metoprolol -Continue amiodarone 200 mg daily, however if heart rate does not recover may need to hold this medication as well -ZIO monitor x 14 days -Status post ablation, follow-up with PCP ASAP  2. Essential hypertension -Blood pressure stable today, 110/68 -Continue current medications which include hydralazine 75 mg 3 times a day, reports that grams daily Lasix 40 mg daily (adding 20 mg daily 3 days then back to original dose), Zaroxolyn 2.5 mg every other day, potassium 40 mEq daily and an extra 20 mEq when he takes metolazone  2.  Paroxysmal atrial fibrillation -Sinus bradycardia today, he reports that his -Advised to continue amiodarone but stopped metoprolol -zio monitor for 14 days  3.  HFpEF -Fluid overloaded today -Daily weights advised -Increase Lasix by 20 mg for 3 days -Encourage lower extremity compression  4.  Smoldering multiple myeloma -Sees Dr. Leonides Schanz for heme/onc  5.  OSA -CPAP nightly -he sleeps well      Dispo: follow-up ASAP with EP, follow-up in 2 months with Dr. [name]  Signed, Sharlene Dory, PA-C

## 2022-11-05 NOTE — Telephone Encounter (Signed)
Spoke with pt who states he did receive Dr [employer].  Appointment scheduled with Jari Favre, PA-C today at [phone].  Pt verbalizes understanding and thanked Charity fundraiser for the call.

## 2022-11-05 NOTE — Progress Notes (Unsigned)
ZIO XT serial # [id number] from office inventory applied to patient.  Dr. [name] to read.

## 2022-11-05 NOTE — Telephone Encounter (Signed)
Called pt in regards to my chart message.  Reports has taken metoprolol 50 mg PO Q AM for 3-4 days.  About 2 days ago took an additional 25 mg.  HR today is 44.  Feels weak and unsteady.   Has not taken metoprolol since yesterday 7/4.  Advised not to take anymore.  For future reference If HR >100 start out with metoprolol 25 mg if that does not bring HR down take an additional 25 mg.  Today BP 111/56-44.  Advised metoprolol may need time to get out of system.  Advised no sudden movements have assist with ambulation as spouse is home with pt.  If symptoms worsen to seek urgent care.  Asked that pt give our office an update around 2-3 pm if he does not go to the ED.  Will send concern to MD.

## 2022-11-06 ENCOUNTER — Encounter: Payer: Self-pay | Admitting: Hematology and Oncology

## 2022-11-07 ENCOUNTER — Encounter: Payer: Self-pay | Admitting: Internal Medicine

## 2022-11-08 ENCOUNTER — Telehealth: Payer: Self-pay | Admitting: Internal Medicine

## 2022-11-08 ENCOUNTER — Ambulatory Visit: Payer: Medicare Other

## 2022-11-08 ENCOUNTER — Other Ambulatory Visit: Payer: Self-pay | Admitting: Neurology

## 2022-11-08 DIAGNOSIS — R293 Abnormal posture: Secondary | ICD-10-CM

## 2022-11-08 DIAGNOSIS — C9000 Multiple myeloma not having achieved remission: Secondary | ICD-10-CM | POA: Diagnosis not present

## 2022-11-08 DIAGNOSIS — S22000D Wedge compression fracture of unspecified thoracic vertebra, subsequent encounter for fracture with routine healing: Secondary | ICD-10-CM | POA: Diagnosis not present

## 2022-11-08 DIAGNOSIS — M542 Cervicalgia: Secondary | ICD-10-CM

## 2022-11-08 DIAGNOSIS — S22000A Wedge compression fracture of unspecified thoracic vertebra, initial encounter for closed fracture: Secondary | ICD-10-CM

## 2022-11-08 DIAGNOSIS — M6281 Muscle weakness (generalized): Secondary | ICD-10-CM | POA: Diagnosis not present

## 2022-11-08 MED ORDER — POTASSIUM CHLORIDE CRYS ER 20 MEQ PO TBCR: 40.0000 meq | EXTENDED_RELEASE_TABLET | Freq: Every day | ORAL | 3 refills | Status: DC

## 2022-11-08 NOTE — Telephone Encounter (Signed)
Patient states he is allergic to the adhesive on the monitor. He states this is the second time this happened with the monitor. He have a burning sensation where the adhesive are placed so he took off monitor last night. He will send monitor back.  He also states he has to get labs for his chemo and would rather get his labs done all at once at his appointment for chemo. Is there anything in particular you would like to order?

## 2022-11-08 NOTE — Telephone Encounter (Signed)
Called pt to inform him that he is supposed to be taking potassium chloride 2 tablets daily and 1 extra tablet on the day that he takes metolazone, which pt takes every other day. I explained to the pt that he medication was already sent to his pharmacy, but I am resending it in, because pt was given enough medication. Rx resend, confirmation received.

## 2022-11-08 NOTE — Telephone Encounter (Signed)
Patient is calling about the Zio monitor he is wearing he states he has to take it off.  He states he might be allergic to the adhesive, he states it was burning him.  He wants to know what he should do.    He states he is do for lab work on Wednesday with [place], he is also getting lab work done at the chemo center he is wondering if he can get the lab work done there. Please advise.

## 2022-11-08 NOTE — Therapy (Signed)
OUTPATIENT PHYSICAL THERAPY ONCOLOGY TREATMENT  Patient Name: Travis [name] MRN: [id number] DOB:03-27-1951, 72 y.o., male Today's Date: 11/08/2022       END OF SESSION:  PT End of Session - 11/08/22 1459     Visit Number 11    Number of Visits 23    Date for PT Re-Evaluation 12/20/22    PT Start Time 1500    PT Stop Time 1556    PT Time Calculation (min) 56 min    Equipment Utilized During Treatment Gait belt    Activity Tolerance Patient tolerated treatment well    Behavior During Therapy WFL for tasks assessed/performed              Past Medical History:  Diagnosis Date   A-fib (HCC)    Arthritis    "comes and goes; mostly in hands, occasionally elbow" (04/05/2017)   Complication of anesthesia    "just wanted to sleep alot  for couple days S/P VARICOCELE OR" (04/05/2017)   DVT (deep venous thrombosis) (HCC)    LLE   Dysrhythmia    PVCs    GERD (gastroesophageal reflux disease)    History of hiatal hernia    History of radiation therapy    Thoracic Spine- 07/13/22-07/23/22-Dr. Antony Blackbird   Hypertension    Impaired glucose tolerance    Multiple myeloma (HCC)    Obesity (BMI 30-39.9) 07/26/2016   OSA on CPAP 07/26/2016   Mild with AHI 12.5/hr now on CPAP at 14cm H2O   PAF (paroxysmal atrial fibrillation) (HCC)    s/p PVI Ablation in 2018 // Flecainide Rx // Echo 8/21: EF 60-65, no RWMA, mild LVH, normal RV SF, moderate RVE, mild BAE, trivial MR, mild dilation of aortic root (40 mm)   Pneumonia    "may have had walking pneumonia a few years ago" (04/05/2017)   Pre-diabetes    Thrombophlebitis    Past Surgical History:  Procedure Laterality Date   ATRIAL FIBRILLATION ABLATION  04/05/2017   ATRIAL FIBRILLATION ABLATION N/A 04/05/2017   Procedure: ATRIAL FIBRILLATION ABLATION;  Surgeon: [name], MD;  Location: [facility];  Service: Cardiovascular;  Laterality: N/A;   BONE BIOPSY Right 12/17/2021   Procedure: BONE BIOPSY;  Surgeon: [name], MD;  Location: [facility];  Service: Orthopedics;  Laterality: Right;   COMPLETE RIGHT HIP REPLACMENT  01/19/2019   EVALUATION UNDER ANESTHESIA WITH HEMORRHOIDECTOMY N/A 02/24/2017   Procedure: EXAM UNDER ANESTHESIA WITH  HEMORRHOIDECTOMY;  Surgeon: [name], MD;  Location: [facility];  Service: General;  Laterality: N/A;   EXCISIONAL TOTAL HIP ARTHROPLASTY WITH ANTIBIOTIC SPACERS Right 09/25/2020   Procedure: EXCISIONAL TOTAL HIP ARTHROPLASTY WITH ANTIBIOTIC SPACERS;  Surgeon: [name], MD;  Location: [facility];  Service: Orthopedics;  Laterality: Right;   FLEXIBLE SIGMOIDOSCOPY N/A 04/15/2015   Procedure:  UNSEDATED FLEXIBLE SIGMOIDOSCOPY;  Surgeon: [name], MD;  Location: [facility];  Service: Endoscopy;  Laterality: N/A;   HUMERUS IM NAIL Right 12/17/2021   Procedure: INTRAMEDULLARY (IM) NAIL HUMERAL;  Surgeon: [name], MD;  Location: [facility];  Service: Orthopedics;  Laterality: Right;   KNEE ARTHROSCOPY Left ~ 2016   POSTERIOR CERVICAL FUSION/FORAMINOTOMY N/A 06/04/2022   Procedure: Thoracic one - Thoracic Four Posterior lateral arthrodesis/ fusion and Thoracic two Corpectomy;  Surgeon: [name], MD;  Location: [facility];  Service: Neurosurgery;  Laterality: N/A;   REIMPLANTATION OF TOTAL HIP Right 12/25/2020   Procedure: REIMPLANTATION/REVISION OF RIGHT TOTAL HIP VERSUS  REPEAT IRRIGATION AND DEBRIDEMENT;  Surgeon: [name], MD;  Location: [facility];  Service: Orthopedics;  Laterality: Right;   TESTICLE SURGERY  1988   VARICOCELE   TONSILLECTOMY     VARICOSE VEIN SURGERY Bilateral    Patient Active Problem List   Diagnosis Date Noted   Pancreatic cyst 09/30/2022   Infection due to novel influenza A virus 09/29/2022   Persistent atrial fibrillation (HCC) 07/15/2022   Acute on chronic diastolic heart failure (HCC) 07/03/2022   Pulmonary infiltrates 07/01/2022   Acute respiratory failure with hypoxia (HCC) 07/01/2022   Hyponatremia  06/29/2022   History of thoracic surgery 06/29/2022   Acute DVT of left tibial vein (HCC) 06/21/2022   Hypokalemia 06/21/2022   Vitamin D deficiency 05/30/2022   Other constipation 05/29/2022   Pathologic compression fracture of spine, initial encounter (HCC) 05/29/2022   Cord compression (HCC) 05/28/2022   Degenerative cervical disc 05/14/2022   Hypercoagulable state due to persistent atrial fibrillation (HCC) 01/11/2022   Multiple myeloma (HCC) 12/10/2021   Medication monitoring encounter 02/27/2021   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   Status post peripherally inserted central catheter (PICC) central line placement 11/06/2020   Drug rash with eosinophilia and systemic symptoms syndrome 11/06/2020   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   PAF (paroxysmal atrial fibrillation) (HCC)    GERD (gastroesophageal reflux disease) 03/29/2018   Hiatal hernia 03/29/2018   History of DVT (deep vein thrombosis) 03/29/2018   CAP (community acquired pneumonia) 03/29/2018   Osteoarthritis 03/29/2018   Neuropathy 08/29/2017   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   Paroxysmal atrial fibrillation with rapid ventricular response (HCC) 04/05/2017   Prolapsed internal hemorrhoids, grade 4, s/p ligation/pexy/hemorrhoidectomy x 2 02/24/2017 02/24/2017   OSA (obstructive sleep apnea) 07/26/2016   Obesity (BMI 30-39.9) 07/26/2016   Atrial fibrillation with RVR (HCC)    Essential hypertension    Chest pain at rest     PCP: Eleanora Neighbor, MD  REFERRING PROVIDER: Antony Blackbird, MD  REFERRING DIAG: Multiple Myeloma  THERAPY DIAG:  Compression fracture of thoracic vertebra, unspecified thoracic vertebral level, initial encounter (HCC)  Multiple myeloma not having achieved remission (HCC)  Muscle weakness (generalized)  Abnormal posture  Neck pain  ONSET DATE: 2023  Rationale for Evaluation and Treatment: Rehabilitation  SUBJECTIVE:                                                                                                                                                                                            SUBJECTIVE STATEMENT:  I was supposed to be in [place] now, but over the holiday weekend my HR  got to 45 and my BP was 109/50.   I felt really weak in the pool and my legs felt like they would give out.  I use metoprolol but now a lower dose if I go into Afib. The MD is fine with me doing therapy. My HR was in the high 50's today   PERTINENT HISTORY:  [name] 72 y.o. male with medical history significant for IgA Kappa Multiple Myeloma. He was diagnosed with smouldering myeloma in 2018 . Last July he developed right arm pain due to a pathologic fx and he underwent radiation and fixation. In early Feb 2024 he had radiation for pathologic T2 compression fx followed by a T1-T4 fusion. PAIN:  Are you having pain? Are you having pain? Not right now NPRS scale: 3/10 Pain location: shoulder blades and neck, bilateral thoracic paraspinals, Right upper arm Pain orientation: Bilateral  PAIN TYPE: aching and tight Pain description: constant  Aggravating factors: getting up and down, looking down Relieving factors: sleeping, laying down.   PRECAUTIONS: Multiple Myeloma with mets to R ribs, R humerus with ORIF for pathological fracture 12/17/21. Lesions have also been found in R rib and also in his skull. Pathologic fx T2 with fusion T1-4 on 06/04/22, Bilateral LE edema (on Lasix), Afib   WEIGHT BEARING RESTRICTIONS: No  FALLS:  Has patient fallen in last 6 months? Yes. Number of falls 1  LIVING ENVIRONMENT: Lives with: lives with their family and lives with their spouse Lives in: House/apartment Stairs: Yes; Internal: 14 steps; on left going up and External: 3 steps; on right going up Has following equipment at home: Single point cane and Walker - 4 wheeled  OCCUPATION: attorney/ mostly retired?  LEISURE: golf (can't do now.), use to  play racquetball  HAND DOMINANCE: right   PRIOR LEVEL OF FUNCTION: Independent  POSTURE:significant slump sitting posture with forward head  PATIENT GOALS: decrease pain,get stronger,improve balance   OBJECTIVE:  COGNITION: Overall cognitive status: Within functional limits for tasks assessed   PALPATION: Very tender bilateral UT/levator, scapular regions  OBSERVATIONS / OTHER ASSESSMENTS: very flexed head and neck with chin nearly at chest  SENSATION: Light touch: Appears intact    POSTURE: significant forward head, round shoulders, increased kyphosis   Gait holding single point cane in left hand but not using. Right shoulder low, flexed neck;difficulty raising head to see where he is going  UPPER EXTREMITY AROM/PROM:  A/PROM RIGHT   eval  RIGHT 11/08/2022  Shoulder extension    Shoulder flexion 91 with compensation 101 with compensation   Shoulder abduction 67 with compensation 75 with compensation  Shoulder internal rotation    Shoulder external rotation C6     (Blank rows = not tested)  A/PROM LEFT   eval  Shoulder extension   Shoulder flexion 123  Shoulder abduction 112  Shoulder internal rotation T7  Shoulder external rotation t3    (Blank rows = not tested)  CERVICAL AROM: All within normal limits:    Percent limited 11/08/22  Flexion Fully flexed   Extension From flexed position decreased 80% Decreased 70%  Right lateral flexion    Left lateral flexion    Right rotation Dec 50% sitting in FH posture   Left rotation        Dec 50%     UPPER EXTREMITY STRENGTH:    LOWER EXTREMITY AROM/PROM:  A/PROM Right eval  Hip flexion   Hip extension   Hip abduction   Hip adduction   Hip internal  rotation   Hip external rotation   Knee flexion   Knee extension   Ankle dorsiflexion   Ankle plantarflexion   Ankle inversion   Ankle eversion    (Blank rows = not tested)  A/PROM LEFT eval  Hip flexion   Hip extension   Hip abduction   Hip  adduction   Hip internal rotation   Hip external rotation   Knee flexion   Knee extension   Ankle dorsiflexion   Ankle plantarflexion   Ankle inversion   Ankle eversion    (Blank rows = not tested)  LOWER EXTREMITY MMT:    10/04/2022  4 position balance test; able to hold DLS feet together , and half tandem stance 10 seconds. Unable to maintain full tandem stance for 10 sec, unable to hold SLS greater than a second or 2 on left, and 4 seconds on right.     LYMPHEDEMA ASSESSMENTS:   SURGERY TYPE/DATE: 8/17/23Left prox humerus fixation for pathologic fx  2/2/2024Fusion T1-T4 for pathologic fx with cord compression 06/04/22 posterior cervical fusion/foraminotomy;T1-4 fusion NUMBER OF LYMPH NODES REMOVED: NA  CHEMOTHERAPY: Velcade/promalyst/Dex;  RADIATION:to prox humerus and thoracic spine pathologic fx  HORMONE TREATMENT: NA  INFECTIONS: NA  FUNCTIONAL TESTS:  10/09/2022;30 second sit to stand 10 with heavy use of arms  GAIT: Distance walked: 20 Assistive device utilized: carried SPC in left Level of assistance: Complete Independence Comments: feels unsteady, very flexed neck posture   QUICK DASH SURVEY:    TODAY'S TREATMENT:                                                                                                                                         DATE:  11/08/2022 O2 97, HR 55 and regularly Nustep UE 7, LE 12, lev 5 x 7 min Assessed shoulder and cervical ROM, tested tandem stance balance and 30 second sit to stand 30 sec Sit to stand 10 using arms, O2 97, HR 61 Tandem stance x 3 B, able to  maintain Right foot forward x 10 sec but not left forward Sidestepping in bars x 3 laps Partial step ups x 15 B SLS x 5 ea to failure Mini squats x 10 o2 94, HR 65 Showed how to perform mini squats to high bed for better form STM to bil UT/upper scap region and cervical muscles and bilateral paraspinal muscles in prop sitting with cocoa butter 11/01/22 NuStep Level 5  seat 12, UE 7 x 4 mins; HR 70-80, allowed to rest;repeated x 4 min HR 70-100 intermittently., 687, RPE 3 Pt in gait belt Heel raises x 15 light HH Marching x 10 B intermittent light HH Sidestepping in bars x 4 laps RPE 1, HR 81-111 Step ups on ax forward and sideways x 10 with CGA but no LOB Instructed and demonstrated to pt supine to and from sit on mat table Bridging 2 x 5  Supine cervical retraction into pillow with PT hands guiding x 10 STM to bil UT/upper scap region and cervical muscles and bilateral paraspinal muscles in prop sitting with cocoa butter  10/29/22: Pt arrives for aquatic physical therapy. Treatment took place in 3.5-5.5 feet of water. Water temperature was 90 degrees F. Pt entered the pool via stairs, step to step slowly with heavy use of rails.Pt requires buoyancy of water for support and to offload joints with strengthening exercises.   Seated water bench with 75% submersion Pt performed seated LE AROM exercises 20x in all planes, while discussing current status/pain/symptoms/and how we should modify todays treatment. 75% depth water walking with bar bell for postural support. VC to lift chin for one length/relax muscles walking back. 6x in each direction. Static stance unassisted: mitten hand arm presses frwd/bk 10x. Small step forward/wide tandem stance 15 sec 2x Bil. CGA. Vc to lift chin. VC to lift chin. Horizontal decompression float with 100% flotation and PTA providing lateral sway to mobilize trunk and soft tissue work along the scar.    10/28/2022  NuStep Level 5 seat 12/UE 7 x 8 mins;  Pt in gait belt Heel raises x 15 light HH Marching intermittent light HH Sidestepping 4 laps in bars with no HH, but CGA of PT Forward stepping on ax x 10 ea, LAQ 3# 2 x 10 ea 02 98, HR 122 STM to bil UT/upper scap region and cervical muscles and bilateral paraspinal muscles in prop sitting with cocoa butter VC required throughout for proper posture/head position At end of rx  02 98, HR varying between 112 and 60  10/20/22:Pt arrives for aquatic physical therapy. Treatment took place in 3.5-5.5 feet of water. Water temperature was 90 degrees F. Pt entered the pool via stairs, step to step slowly with heavy use of rails.Pt requires buoyancy of water for support and to offload joints with strengthening exercises.   Seated water bench with 75% submersion Pt performed seated LE AROM exercises 20x in all planes, while discussing current status/pain/symptoms/and how we should modify todays treatment. 75% depth water walking with bar bell for postural support. VC to lift chin for one length/relax muscles walking back. 6x in each direction. Pt demonstrates less unsteadiness today. Static stance unassisted: mitten hand arm presses frwd/bk 10x. Pt unsteady. Vc to lift chin. Single buoy UE weights for horizontal abd/add 10x. VC to lift chin. Horizontal decompression float with 100% flotation and PTA providing lateral sway to mobilize trunk and soft tissue work along the scar.   10/18/2022 NuStep Level 5 seat 12/UE 7 x 8 mins; 569 steps In parallel bars: Standing heel raises x 12 light hand touch, marching light hand touch 2 x 10 Sidestepping 3 lengths and return intermittent light touch Sit to stand 2 x 5 with 1 hand A Step ups on ax x 10 ea forward and sideways B Vector reaches 2 x 5  B Ambulation on beam forward 6 lengths and sideways 6 length;no HH to light HH STM to bil UT/upper scap region and cervical muscles and bilateral paraspinal muscles in prop sitting with cocoa butter VC required throughout for proper posture/head position   PATIENT EDUCATION:  Education details: sitting posture with lumbar roll Person educated: Patient Education method: Medical illustrator Education comprehension: verbalized understanding and returned demonstration  HOME EXERCISE PROGRAM: Doing HEP from HHPT; cervical ROM, shoulder rolls, shrugs, scap retraction, UT  stretches  ASSESSMENT:  CLINICAL IMPRESSION:   Pt has been compliant with his HEP for improving  his posture and for strengthening. He continues to have complaints of upper back discomfort/tightness since his surgery and difficulty getting his head to an erect posture although he has made some minor improvement. His right shoulder ROM has also improved slightly but continues with compensation. He was able to maintain tandem stance balance with right foot forward x 10 seconds but still has considerable difficulty with left foot forward.  SLS is decreased bilaterally however he is improving with shifting his wt over the stance leg. .. He demonstrates improved strength by indicating he can now go up and down stairs with reciprocal gait, although it is still difficult. He has difficulty with sit to stand and is below average with 10 reps in 30 seconds with excessive use of hands He will continue to benefit from skilled therapy to address deficits and return pt to a more active and safe lifestyle.  OBJECTIVE IMPAIRMENTS: decreased activity tolerance, decreased balance, difficulty walking, decreased ROM, decreased strength, increased edema, impaired UE functional use, postural dysfunction, and pain.   ACTIVITY LIMITATIONS: carrying, lifting, dressing, reach over head, and locomotion level  PARTICIPATION LIMITATIONS: cleaning, laundry, driving, and occupation  PERSONAL FACTORS:  Multiple Myeloma  are also affecting patient's functional outcome.   REHAB POTENTIAL: Good  CLINICAL DECISION MAKING: Evolving/moderate complexity  EVALUATION COMPLEXITY: Moderate  GOALS: Goals reviewed with patient? Yes  SHORT TERM GOALS: Target date: 10/21/22  Pt will be educated in and independent in proper sitting posture using lumbar roll prn Baseline: Goal status; MET 11/08/22 2.  Pt will report decreased muscle soreness in bilateral upper back x 25% Baseline:  Goal status: ONGOING  3.  Pt will be compliant with a  HEP for postural strengthening/ROM Baseline:  Goal status: MET  LONG TERM GOALS: Target date: 11/04/22  Pt will be able to bring his head to neutral position for safety with ambulation/driving Baseline:  Goal status:Ongoing 2.  Pt will have decreased upper back pain by 50% or more Baseline:  Goal status:Ongoing 3.  Pt will be able to flex right shoulder to 115 degrees with minimal compensation for improved reaching ability Baseline:  Goal status: ONGOING 4; Pt will be able to maintain tandem stance on both sides x 10 sec to demonstrated improved balance. Goal status:MET right foot forward 11/08/2022, Ongoing left foot forward  5. Pt will be able to perform sit to stand x 5 without use of Ue's  Baseline:must use hands  Goal status:New PT FREQUENCY: 2x/week  PT DURATION: 6 weeks  PLANNED INTERVENTIONS: Therapeutic exercises, Therapeutic activity, Neuromuscular re-education, Balance training, Gait training, Patient/Family education, Self Care, Joint mobilization, Aquatic Therapy, Manual therapy, and Re-evaluation  PLAN FOR NEXT SESSION: Cont land and aquatic exercises working on posture, strength, balance, STM prn for pain Alvira Monday, PT 11/08/22 5:21 PM

## 2022-11-08 NOTE — Telephone Encounter (Signed)
*  STAT* If patient is at the pharmacy, call can be transferred to refill team.   1. Which medications need to be refilled? (please list name of each medication and dose if known)  potassium chloride SA (KLOR-CON M) 20 MEQ tablet   2. Which pharmacy/location (including street and city if local pharmacy) is medication to be sent to?[facility] [address] [place], [place] - [address] AT [facility] [address]   3. Do they need a 30 day or 90 day supply? 90 day  Patient states he is taking 2 in the morning and 2 at night. Dosage was increased.

## 2022-11-09 ENCOUNTER — Other Ambulatory Visit: Payer: Self-pay | Admitting: Hematology and Oncology

## 2022-11-09 ENCOUNTER — Other Ambulatory Visit: Payer: Self-pay | Admitting: *Deleted

## 2022-11-09 ENCOUNTER — Ambulatory Visit (HOSPITAL_BASED_OUTPATIENT_CLINIC_OR_DEPARTMENT_OTHER): Payer: Medicare Other | Attending: Radiation Oncology | Admitting: Physical Therapy

## 2022-11-09 DIAGNOSIS — R293 Abnormal posture: Secondary | ICD-10-CM | POA: Diagnosis not present

## 2022-11-09 DIAGNOSIS — M6281 Muscle weakness (generalized): Secondary | ICD-10-CM | POA: Diagnosis not present

## 2022-11-09 DIAGNOSIS — S22000A Wedge compression fracture of unspecified thoracic vertebra, initial encounter for closed fracture: Secondary | ICD-10-CM | POA: Diagnosis not present

## 2022-11-09 DIAGNOSIS — C9000 Multiple myeloma not having achieved remission: Secondary | ICD-10-CM | POA: Diagnosis not present

## 2022-11-09 DIAGNOSIS — R001 Bradycardia, unspecified: Secondary | ICD-10-CM

## 2022-11-09 DIAGNOSIS — I4891 Unspecified atrial fibrillation: Secondary | ICD-10-CM

## 2022-11-09 DIAGNOSIS — M542 Cervicalgia: Secondary | ICD-10-CM | POA: Diagnosis not present

## 2022-11-09 LAB — MULTIPLE MYELOMA PANEL, SERUM
Albumin SerPl Elph-Mcnc: 3.6 g/dL (ref 2.9–4.4)
Albumin/Glob SerPl: 1.8 — ABNORMAL HIGH (ref 0.7–1.7)
Alpha 1: 0.3 g/dL (ref 0.0–0.4)
Alpha2 Glob SerPl Elph-Mcnc: 0.7 g/dL (ref 0.4–1.0)
B-Globulin SerPl Elph-Mcnc: 0.9 g/dL (ref 0.7–1.3)
Gamma Glob SerPl Elph-Mcnc: 0.3 g/dL — ABNORMAL LOW (ref 0.4–1.8)
Globulin, Total: 2.1 g/dL — ABNORMAL LOW (ref 2.2–3.9)
IgA: 39 mg/dL — ABNORMAL LOW (ref 61–437)
IgG (Immunoglobin G), Serum: 394 mg/dL — ABNORMAL LOW (ref 603–1613)
IgM (Immunoglobulin M), Srm: 15 mg/dL (ref 15–143)
Total Protein ELP: 5.7 g/dL — ABNORMAL LOW (ref 6.0–8.5)

## 2022-11-09 NOTE — Progress Notes (Unsigned)
[facility] [phone]   [phone]  PROGRESS NOTE  Patient Care Team: [name], MD as PCP - General (Internal Medicine) Pricilla Riffle, MD as PCP - Cardiology (Cardiology) Quintella Reichert, MD as PCP - Sleep Medicine (Cardiology) Lanier Prude, MD as PCP - Electrophysiology (Cardiology) [name], MD as Consulting Physician (General Surgery) Vida Rigger, MD as Consulting Physician (Gastroenterology) Quintella Reichert, MD as Consulting Physician (Sleep Medicine) Glendale Chard, DO as Consulting Physician (Neurology)  Hematological/Oncological History # IgA Kappa Multiple Myeloma 05/05/2020: Bone marrow biopsy showed increased number of plasma cells representing 14% of all cells in the aspirate associated with numerous variably sized clusters in the clot and biopsy sections 04/10/2021: M protein 0.3, IgA kappa specificity. Kappa 580.5, Labda 7.0, ratio 82.93.   12/07/2021: Labs show of 1971.3, lambda 5.5, ratio 358.42 12/10/2021: Transition care to Dr. Leonides Schanz. 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  01/26/2022: Cycle 2 Day 1 of Dara/Rev/Dex 02/22/2022: Cycle 3 Day 1 of Dara/Rev/Dex 03/23/2022: Cycle 4 Day 1 of Dara/Rev/Dex 04/20/2022: Cycle 5 Day 1 of Dara/Rev/Dex 05/18/2022: Cycle 6 Day 1 of Dara/Rev/Dex 05/27/2022: MRI cervical/thoracic/lumbar: pathologic fracture at T2 with significant osseous retropulsion and resulting cord compression. Additional smaller lesions within T1 vertebral body, right C7 and T3 articulating facets.  Small Multiple Myeloma lesions scattered at all lumbar levels and in the visible left sacral ala. Largest lumbar tumor is 2.3 cm in the left L1 posterior elements and pedicle. No lumbar extraosseous or epidural tumor extension, or pathologic fracture.Underlying chronic lumbar spine  degeneration, with a new central disc herniation at L3-L4 since 2021 now resulting in mild to moderate degenerative spinal stenosis there. 05/28/2022-06/09/2022: Admitted for T2 pathologic fracture and cord compression. He received urgent radiation therapy for a total of 3.0 Gy. He underwent  thoracic 1 through 4 posterior lateral arthrodesis/fusion and 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  06/23/2022: Cycle 1 Day 1 of Velcade/Pomalyst/Dex 08/04/2022: Cycle 2 Day 1 of Velcade/Pomalyst/Dex. Pomalyst to be decreased to 2mg  PO daily due to cytopenias.  09/01/2022: Cycle 3 Day 1 of Velcade/Pomalyst/Dex. (Missed 2nd dose of cycle due to hospitalization)  10/06/2022: Cycle 4 Day 1 of Velcade/Pomalyst/Dex.  10/27/2022: Cycle 5 Day 1 of Velcade/Pomalyst/Dex.   Interval History:  Travis [name] 72 y.o. male with medical history significant for IgA Kappa Multiple Myeloma who presents for a follow up visit. He was last seen on 10/27/2022. In the interim, he continued on Velcade/Pomalyst/Dex therapy.  On exam today Mr. Gabrielsen reports he unfortunately had to cancel his trip to [place] due to bradycardia and feeling unwell.  He notes he is doing pool physical therapy to try to help with his neck pain and reports that is the best experience he has had an improving that pain.  He reports that he does it once a week but he does have a friend with a pool who is offered to let him use the pool.  He notes that his energy level is good and his appetite is strong.  He is having a lot of constipation for which he is taking stool softeners and MiraLAX.  He is hopeful that we will soon be able to drop down his Velcade dosage to every other week rather than continued once weekly.  Overall he feels well and is willing and able to proceed with treatment today.  He denies fevers, chills, sweats, shortness of breath, chest pain or cough. He has no other complaints. Rest of the 10 point ROS is below.     MEDICAL  HISTORY:  Past Medical History:  Diagnosis Date   A-fib [facility])    Arthritis    "comes and goes; mostly in hands, occasionally elbow" (04/05/2017)   Complication of anesthesia    "just wanted to sleep alot  for couple days S/P VARICOCELE OR" (04/05/2017)   DVT (deep venous thrombosis) (HCC)    LLE   Dysrhythmia    PVCs    GERD (gastroesophageal reflux disease)    History of hiatal hernia    History of radiation therapy    Thoracic Spine- 07/13/22-07/23/22-Dr. Antony Blackbird   Hypertension    Impaired glucose tolerance    Multiple myeloma (HCC)    Obesity (BMI 30-39.9) 07/26/2016   OSA on CPAP 07/26/2016   Mild with AHI 12.5/hr now on CPAP at 14cm H2O   PAF (paroxysmal atrial fibrillation) (HCC)    s/p PVI Ablation in 2018 // Flecainide Rx // Echo 8/21: EF 60-65, no RWMA, mild LVH, normal RV SF, moderate RVE, mild BAE, trivial MR, mild dilation of aortic root (40 mm)   Pneumonia    "may have had walking pneumonia a few years ago" (04/05/2017)   Pre-diabetes    Thrombophlebitis     SURGICAL HISTORY: Past Surgical History:  Procedure Laterality Date   ATRIAL FIBRILLATION ABLATION  04/05/2017   ATRIAL FIBRILLATION ABLATION N/A 04/05/2017   Procedure: ATRIAL FIBRILLATION ABLATION;  Surgeon: [name], MD;  Location: [facility];  Service: Cardiovascular;  Laterality: N/A;   BONE BIOPSY Right 12/17/2021   Procedure: BONE BIOPSY;  Surgeon: [name], MD;  Location: [facility];  Service: Orthopedics;  Laterality: Right;   COMPLETE RIGHT HIP REPLACMENT  01/19/2019   EVALUATION UNDER ANESTHESIA WITH HEMORRHOIDECTOMY N/A 02/24/2017   Procedure: EXAM UNDER ANESTHESIA WITH  HEMORRHOIDECTOMY;  Surgeon: [name], MD;  Location: [facility];  Service: General;  Laterality: N/A;   EXCISIONAL TOTAL HIP ARTHROPLASTY WITH ANTIBIOTIC SPACERS Right 09/25/2020   Procedure: EXCISIONAL TOTAL HIP ARTHROPLASTY WITH ANTIBIOTIC SPACERS;  Surgeon: [name], MD;  Location: [facility];   Service: Orthopedics;  Laterality: Right;   FLEXIBLE SIGMOIDOSCOPY N/A 04/15/2015   Procedure:  UNSEDATED FLEXIBLE SIGMOIDOSCOPY;  Surgeon: [name], MD;  Location: [facility];  Service: Endoscopy;  Laterality: N/A;   HUMERUS IM NAIL Right 12/17/2021   Procedure: INTRAMEDULLARY (IM) NAIL HUMERAL;  Surgeon: [name], MD;  Location: [facility];  Service: Orthopedics;  Laterality: Right;   KNEE ARTHROSCOPY Left ~ 2016   POSTERIOR CERVICAL FUSION/FORAMINOTOMY N/A 06/04/2022   Procedure: Thoracic one - Thoracic Four Posterior lateral arthrodesis/ fusion and Thoracic two Corpectomy;  Surgeon: [name], MD;  Location: [facility];  Service: Neurosurgery;  Laterality: N/A;   REIMPLANTATION OF TOTAL HIP Right 12/25/2020   Procedure: REIMPLANTATION/REVISION OF RIGHT TOTAL HIP VERSUS REPEAT IRRIGATION AND DEBRIDEMENT;  Surgeon: [name], MD;  Location: [facility];  Service: Orthopedics;  Laterality: Right;   TESTICLE SURGERY  1988   VARICOCELE   TONSILLECTOMY     VARICOSE VEIN SURGERY Bilateral     SOCIAL HISTORY: Social History   Socioeconomic History   Marital status: Married    Spouse name: Not on file   Number of children: 1   Years of education: law school   Highest education level: Not on file  Occupational  History   Occupation: Clinical research associate  Tobacco Use   Smoking status: Never   Smokeless tobacco: Never  Vaping Use   Vaping Use: Never used  Substance and Sexual Activity   Alcohol use: Yes    Alcohol/week: 2.0 standard drinks of alcohol    Types: 2 Glasses of wine per week    Comment: 2 glasses of wine a week, occ bourbon, beer   Drug use: No   Sexual activity: Not Currently  Other Topics Concern   Not on file  Social History Narrative   Lives in [place] with spouse in a 2 story home.  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 1 daughter.     Education: Social worker school.    Social Determinants of Health   Financial Resource Strain: Not  on file  Food Insecurity: No Food Insecurity (09/27/2022)   Hunger Vital Sign    Worried About Running Out of Food in the Last Year: Never true    Ran Out of Food in the Last Year: Never true  Transportation Needs: No Transportation Needs (09/27/2022)   PRAPARE - Administrator, Civil Service (Medical): No    Lack of Transportation (Non-Medical): No  Physical Activity: Not on file  Stress: Not on file  Social Connections: Not on file  Intimate Partner Violence: Not At Risk (09/27/2022)   Humiliation, Afraid, Rape, and Kick questionnaire    Fear of Current or Ex-Partner: No    Emotionally Abused: No    Physically Abused: No    Sexually Abused: No    FAMILY HISTORY: Family History  Problem Relation Age of Onset   Dementia Mother    Stroke Father    Atrial fibrillation Father    Hypertension Father    Hypertension Paternal Grandfather    Dementia Maternal Grandmother     ALLERGIES:  is allergic to linezolid and vancomycin.  MEDICATIONS:  Current Outpatient Medications  Medication Sig Dispense Refill   acyclovir (ZOVIRAX) 400 MG tablet Take 1 tablet (400 mg total) by mouth 2 (two) times daily. 180 tablet 3   albuterol (VENTOLIN HFA) 108 (90 Base) MCG/ACT inhaler Inhale 2 puffs into the lungs every 6 (six) hours as needed for wheezing or shortness of breath. 8 g 0   amiodarone (PACERONE) 200 MG tablet Take 1 tablet (200 mg total) by mouth daily. Start after finishing 40 mg twice daily dose of  days (Patient taking differently: Take 200 mg by mouth daily.) 180 tablet 1   apixaban (ELIQUIS) 5 MG TABS tablet Take 1 tablet (5 mg total) by mouth 2 (two) times daily. 60 tablet 11   cholecalciferol (VITAMIN D3) 25 MCG (1000 UNIT) tablet Take 1,000 Units by mouth daily.     Cyanocobalamin (VITAMIN B-12 PO) Take 1,000 mcg by mouth daily.     dexamethasone (DECADRON) 4 MG tablet Take 10 tablets (40 mg total) by mouth every 7 (seven) days. Take 40 mg by mouth once a day in the  morning of chemo on Wednesdays- or unless otherwise instructed 40 tablet 2   fluticasone (FLONASE) 50 MCG/ACT nasal spray Place 1 spray into both nostrils daily as needed for allergies or rhinitis.     furosemide (LASIX) 20 MG tablet Take 3 tablets (60 mg total) by mouth daily. If your weight increases by more than 2 pounds compared to the day prior or 5 pounds over the past week take an additional dose that day. 90 tablet 2   gabapentin (  NEURONTIN) 300 MG capsule Take 2 capsules (600 mg total) by mouth 2 (two) times daily. 360 capsule 1   hydrALAZINE (APRESOLINE) 50 MG tablet Take 1.5 tablets (75 mg total) by mouth 3 (three) times daily. 135 tablet 3   irbesartan (AVAPRO) 150 MG tablet Take 1 tablet (150 mg total) by mouth daily. 30 tablet 5   metolazone (ZAROXOLYN) 2.5 MG tablet Take 1 tablet (2.5 mg total) by mouth every other day. 45 tablet 3   metoprolol succinate (TOPROL-XL) 50 MG 24 hr tablet Take 1/2 to 1 as needed up to twice a day for AFIB and heart rate above 100. 90 tablet 3   Omega-3 Fatty Acids (FISH OIL PO) Take 1 Capful by mouth daily.     pomalidomide (POMALYST) 2 MG capsule Take 1 capsule (2 mg total) by mouth daily. Take one tablet (2 mg by mouth) daily for 21 days, then take 7 days off [employer] auth #  [id number] Date obtained 10/29/22 21 capsule 0   potassium chloride SA (KLOR-CON M) 20 MEQ tablet Take 2 tablets (40 mEq total) by mouth daily. Take an extra tablet on the day you take the Metolazone (60 meq total) 225 tablet 3   sodium chloride (OCEAN) 0.65 % SOLN nasal spray Place 1 spray into both nostrils as needed for congestion.     No current facility-administered medications for this visit.   Facility-Administered Medications Ordered in Other Visits  Medication Dose Route Frequency Provider Last Rate Last Admin   0.9 %  sodium chloride infusion   Intravenous Once Jaci Standard, MD       bortezomib SQ (VELCADE) chemo injection (2.5mg /mL concentration) 3 mg  1.3 mg/m2  (Treatment Plan Recorded) Subcutaneous Once Jaci Standard, MD       Zoledronic Acid (ZOMETA) IVPB 4 mg  4 mg Intravenous Once Jaci Standard, MD        REVIEW OF SYSTEMS:   Constitutional: ( - ) fevers, ( - )  chills , ( - ) night sweats Eyes: ( - ) blurriness of vision, ( - ) double vision, ( - ) watery eyes Ears, nose, mouth, throat, and face: ( - ) mucositis, ( - ) sore throat Respiratory: ( - ) cough, ( - ) dyspnea, ( - ) wheezes Cardiovascular: ( - ) palpitation, ( - ) chest discomfort, ( - ) lower extremity swelling Gastrointestinal:  ( - ) nausea, ( - ) heartburn, ( - ) change in bowel habits Skin: ( - ) abnormal skin rashes Lymphatics: ( - ) new lymphadenopathy, ( - ) easy bruising Neurological: ( - ) numbness, ( - ) tingling, ( - ) new weaknesses Behavioral/Psych: ( - ) mood change, ( - ) new changes  All other systems were reviewed with the patient and are negative.  PHYSICAL EXAMINATION: ECOG PERFORMANCE STATUS: 2 - Symptomatic, <50% confined to bed  Vitals:   11/10/22 1045  BP: (!) 145/77  Pulse: (!) 53  Resp: 17  Temp: 97.8 F (36.6 C)  SpO2: 97%        Filed Weights   11/10/22 1045  Weight: 251 lb 8 oz (114.1 kg)         GENERAL: Well-appearing elderly Caucasian male, alert, no distress and comfortable SKIN: no overt abnormalities of the skin.  EYES: conjunctiva are pink and non-injected, sclera clear LUNGS: clear to auscultation and percussion with normal breathing effort HEART: tachycardic &  no murmurs. +bilateral lower extremity edema, left  greater than right Musculoskeletal: no cyanosis of digits and no clubbing  PSYCH: alert & oriented x 3, fluent speech NEURO: no focal motor/sensory deficits  LABORATORY DATA:  I have reviewed the data as listed    Latest Ref Rng & Units 11/10/2022   10:15 AM 11/03/2022    2:34 PM 10/27/2022    9:47 AM  CBC  WBC 4.0 - 10.5 K/uL 8.4  4.3  6.3   Hemoglobin 13.0 - 17.0 g/dL 09.8  11.9  14.7    Hematocrit 39.0 - 52.0 % 33.4  37.5  36.3   Platelets 150 - 400 K/uL 182  190  156        Latest Ref Rng & Units 11/10/2022   10:15 AM 11/03/2022    2:34 PM 10/27/2022    9:47 AM  CMP  Glucose 70 - 99 mg/dL 93  829  90   BUN 8 - 23 mg/dL 31  17  19    Creatinine 0.61 - 1.24 mg/dL 5.62  1.30  8.65   Sodium 135 - 145 mmol/L 143  140  141   Potassium 3.5 - 5.1 mmol/L 3.7  3.7  3.7   Chloride 98 - 111 mmol/L 106  102  107   CO2 22 - 32 mmol/L 32  32  30   Calcium 8.9 - 10.3 mg/dL 9.1  9.1  9.0   Total Protein 6.5 - 8.1 g/dL 5.9  5.8  5.9   Total Bilirubin 0.3 - 1.2 mg/dL 0.3  0.5  0.4   Alkaline Phos 38 - 126 U/L 78  105  102   AST 15 - 41 U/L 11  13  11    ALT 0 - 44 U/L 19  21  17      Lab Results  Component Value Date   MPROTEIN Not Observed 11/03/2022   MPROTEIN Not Observed 10/27/2022   MPROTEIN Not Observed 10/06/2022   Lab Results  Component Value Date   KPAFRELGTCHN 44.5 (H) 11/03/2022   KPAFRELGTCHN 41.8 (H) 10/27/2022   KPAFRELGTCHN 49.0 (H) 10/06/2022   LAMBDASER 13.5 11/03/2022   LAMBDASER 15.2 10/27/2022   LAMBDASER 12.0 10/06/2022   KAPLAMBRATIO 3.30 (H) 11/03/2022   KAPLAMBRATIO 2.75 (H) 10/27/2022   KAPLAMBRATIO 4.08 (H) 10/06/2022     RADIOGRAPHIC STUDIES: MR CERVICAL SPINE W WO CONTRAST  Result Date: 10/25/2022 CLINICAL DATA:  Worsening neck and mid back pain. History of multiple myeloma. EXAM: MRI CERVICAL, THORACIC AND LUMBAR SPINE WITHOUT AND WITH CONTRAST TECHNIQUE: Multiplanar and multiecho pulse sequences of the cervical spine, to include the craniocervical junction and cervicothoracic junction, and thoracic and lumbar spine, were obtained without and with intravenous contrast. CONTRAST:  10mL GADAVIST GADOBUTROL 1 MMOL/ML IV SOLN COMPARISON:  CT chest dated Sep 29, 2022. CT neck dated August 02, 2022. MRI thoracic and lumbar spine dated May 29, 2022. MRI cervical spine dated May 27, 2022. FINDINGS: MRI CERVICAL SPINE FINDINGS Alignment:  Straightening of the normal cervical lordosis. Vertebrae: Unchanged small enhancing lesion within the right C7 inferior articular process. No acute fracture or evidence of discitis. Cord: Normal signal and morphology. Posterior Fossa, vertebral arteries, paraspinal tissues: Rim enhancing midline partially loculated fluid collection extending from C7-T4, not significantly changed since April 1. Disc levels: C2-C3: Negative disc. Unchanged severe left and moderate right facet arthropathy. Unchanged moderate left neuroforaminal stenosis. No spinal canal or right neuroforaminal stenosis. C3-C4: Negative disc. Unchanged facet joint ankylosis. Unchanged moderate left neuroforaminal stenosis. No spinal canal or right neuroforaminal stenosis. C4-C5:  Unchanged mild disc bulging with moderate right-greater-than-left facet uncovertebral hypertrophy. Unchanged mild spinal canal stenosis with severe right and moderate left neuroforaminal stenosis. C5-C6: Unchanged posterior disc osteophyte complex, uncovertebral ankylosis, and mild bilateral facet arthropathy. Unchanged mild spinal canal and mild-to-moderate bilateral neuroforaminal stenosis. C6-C7: Unchanged small posterior disc osteophyte complex and moderate bilateral facet arthropathy. Unchanged mild bilateral neuroforaminal stenosis. No spinal canal stenosis. C7-T1: Negative disc. Unchanged bilateral facet arthropathy. No stenosis. MRI THORACIC SPINE FINDINGS Alignment:  Mildly exaggerated upper thoracic kyphosis is unchanged. Vertebrae: Postsurgical changes and susceptibility artifact related to prior T2 corpectomy and T1-T4 posterior fusion. Abnormal marrow signal within the residual T2 vertebral body consistent with residual tumor. Residual left-sided bony retropulsion with flattening of the left cord and mild left-sided spinal canal stenosis. The spinal canal otherwise appears patent at this level based on the sagittal cervical spine sequences. Small enhancing lesions  identified within the T6, T8, and T10 vertebral bodies, as well as the T11 spinous process, not significantly changed. No acute fracture or evidence of discitis. Cord:  Normal signal. Paraspinal and other soft tissues: Unchanged bone lesions in the posterior right sixth and eighth ribs. Trace bilateral pleural effusions. Disc levels: Tiny T7-T8 and small T8-T9 central disc protrusions. No degenerative spinal canal or neuroforaminal stenosis. MRI LUMBAR SPINE FINDINGS Segmentation:  Standard. Alignment:  Physiologic. Vertebrae: Unchanged enhancing lesions in the left L1 and L2 pedicles, L2, L3, L4, and L5 vertebral bodies, sacrum, and posterior iliac bones. No definite new lesion. No acute fracture or evidence of discitis. Conus medullaris and cauda equina: Conus extends to the L1 level. Conus and cauda equina appear normal. Paraspinal and other soft tissues: Negative. Disc levels: T12-L1:  Negative. L1-L2:  Negative. L2-L3: Unchanged mild disc bulging and bilateral facet arthropathy. No stenosis. L3-L4: Unchanged mild-to-moderate disc bulging. Interval enlargement of the central disc herniation with new extruded components migrating superiorly and inferiorly. Unchanged mild bilateral facet arthropathy. Progressive moderate spinal canal stenosis. No neuroforaminal stenosis. L4-L5: Unchanged mild disc bulging and bilateral facet arthropathy. Unchanged mild right-greater-than-left lateral recess stenosis. No spinal canal or neuroforaminal stenosis. L5-S1: Unchanged mild disc bulging with superimposed small right paracentral disc protrusion and bulky left foraminal disc osteophyte complex. Unchanged mild bilateral facet arthropathy. Unchanged mild left neuroforaminal stenosis. No spinal canal or right neuroforaminal stenosis. IMPRESSION: CERVICAL SPINE: 1. Unchanged small enhancing lesion within the right C7 inferior articular process. No new osseous lesion or pathologic fracture. 2. Unchanged multilevel cervical  spondylosis as described above. THORACIC SPINE: 1. Postsurgical changes related to prior T2 corpectomy and T1-T4 posterior fusion. Mild residual left-sided bony retropulsion at T2 flattens the left ventral cord without cord signal abnormality or high-grade stenosis. 2. Unchanged small enhancing lesions within the T6, T8, and T10 vertebral bodies, as well as the T11 spinous process. No new osseous lesion or pathologic fracture. 3. Posterior midline partially loculated fluid collection extending from C7-T4, not significantly changed since April 1, likely a seroma. 4. Trace bilateral pleural effusions. LUMBAR SPINE: 1. Unchanged multiple enhancing lesions within the lumbar spine, sacrum, and posterior iliac bones. No new osseous lesion or pathologic fraction. 2. Interval enlargement of the central disc herniation at L3-L4 with new extruded components migrating superiorly and inferiorly. Progressive moderate spinal canal stenosis. Electronically Signed   By: Obie Dredge M.D.   On: 10/25/2022 09:32   MR THORACIC SPINE W WO CONTRAST  Result Date: 10/25/2022 CLINICAL DATA:  Worsening neck and mid back pain. History of multiple myeloma. EXAM: MRI CERVICAL, THORACIC AND  LUMBAR SPINE WITHOUT AND WITH CONTRAST TECHNIQUE: Multiplanar and multiecho pulse sequences of the cervical spine, to include the craniocervical junction and cervicothoracic junction, and thoracic and lumbar spine, were obtained without and with intravenous contrast. CONTRAST:  10mL GADAVIST GADOBUTROL 1 MMOL/ML IV SOLN COMPARISON:  CT chest dated Sep 29, 2022. CT neck dated August 02, 2022. MRI thoracic and lumbar spine dated May 29, 2022. MRI cervical spine dated May 27, 2022. FINDINGS: MRI CERVICAL SPINE FINDINGS Alignment: Straightening of the normal cervical lordosis. Vertebrae: Unchanged small enhancing lesion within the right C7 inferior articular process. No acute fracture or evidence of discitis. Cord: Normal signal and morphology.  Posterior Fossa, vertebral arteries, paraspinal tissues: Rim enhancing midline partially loculated fluid collection extending from C7-T4, not significantly changed since April 1. Disc levels: C2-C3: Negative disc. Unchanged severe left and moderate right facet arthropathy. Unchanged moderate left neuroforaminal stenosis. No spinal canal or right neuroforaminal stenosis. C3-C4: Negative disc. Unchanged facet joint ankylosis. Unchanged moderate left neuroforaminal stenosis. No spinal canal or right neuroforaminal stenosis. C4-C5: Unchanged mild disc bulging with moderate right-greater-than-left facet uncovertebral hypertrophy. Unchanged mild spinal canal stenosis with severe right and moderate left neuroforaminal stenosis. C5-C6: Unchanged posterior disc osteophyte complex, uncovertebral ankylosis, and mild bilateral facet arthropathy. Unchanged mild spinal canal and mild-to-moderate bilateral neuroforaminal stenosis. C6-C7: Unchanged small posterior disc osteophyte complex and moderate bilateral facet arthropathy. Unchanged mild bilateral neuroforaminal stenosis. No spinal canal stenosis. C7-T1: Negative disc. Unchanged bilateral facet arthropathy. No stenosis. MRI THORACIC SPINE FINDINGS Alignment:  Mildly exaggerated upper thoracic kyphosis is unchanged. Vertebrae: Postsurgical changes and susceptibility artifact related to prior T2 corpectomy and T1-T4 posterior fusion. Abnormal marrow signal within the residual T2 vertebral body consistent with residual tumor. Residual left-sided bony retropulsion with flattening of the left cord and mild left-sided spinal canal stenosis. The spinal canal otherwise appears patent at this level based on the sagittal cervical spine sequences. Small enhancing lesions identified within the T6, T8, and T10 vertebral bodies, as well as the T11 spinous process, not significantly changed. No acute fracture or evidence of discitis. Cord:  Normal signal. Paraspinal and other soft tissues:  Unchanged bone lesions in the posterior right sixth and eighth ribs. Trace bilateral pleural effusions. Disc levels: Tiny T7-T8 and small T8-T9 central disc protrusions. No degenerative spinal canal or neuroforaminal stenosis. MRI LUMBAR SPINE FINDINGS Segmentation:  Standard. Alignment:  Physiologic. Vertebrae: Unchanged enhancing lesions in the left L1 and L2 pedicles, L2, L3, L4, and L5 vertebral bodies, sacrum, and posterior iliac bones. No definite new lesion. No acute fracture or evidence of discitis. Conus medullaris and cauda equina: Conus extends to the L1 level. Conus and cauda equina appear normal. Paraspinal and other soft tissues: Negative. Disc levels: T12-L1:  Negative. L1-L2:  Negative. L2-L3: Unchanged mild disc bulging and bilateral facet arthropathy. No stenosis. L3-L4: Unchanged mild-to-moderate disc bulging. Interval enlargement of the central disc herniation with new extruded components migrating superiorly and inferiorly. Unchanged mild bilateral facet arthropathy. Progressive moderate spinal canal stenosis. No neuroforaminal stenosis. L4-L5: Unchanged mild disc bulging and bilateral facet arthropathy. Unchanged mild right-greater-than-left lateral recess stenosis. No spinal canal or neuroforaminal stenosis. L5-S1: Unchanged mild disc bulging with superimposed small right paracentral disc protrusion and bulky left foraminal disc osteophyte complex. Unchanged mild bilateral facet arthropathy. Unchanged mild left neuroforaminal stenosis. No spinal canal or right neuroforaminal stenosis. IMPRESSION: CERVICAL SPINE: 1. Unchanged small enhancing lesion within the right C7 inferior articular process. No new osseous lesion or pathologic fracture. 2. Unchanged multilevel cervical  spondylosis as described above. THORACIC SPINE: 1. Postsurgical changes related to prior T2 corpectomy and T1-T4 posterior fusion. Mild residual left-sided bony retropulsion at T2 flattens the left ventral cord without cord  signal abnormality or high-grade stenosis. 2. Unchanged small enhancing lesions within the T6, T8, and T10 vertebral bodies, as well as the T11 spinous process. No new osseous lesion or pathologic fracture. 3. Posterior midline partially loculated fluid collection extending from C7-T4, not significantly changed since April 1, likely a seroma. 4. Trace bilateral pleural effusions. LUMBAR SPINE: 1. Unchanged multiple enhancing lesions within the lumbar spine, sacrum, and posterior iliac bones. No new osseous lesion or pathologic fraction. 2. Interval enlargement of the central disc herniation at L3-L4 with new extruded components migrating superiorly and inferiorly. Progressive moderate spinal canal stenosis. Electronically Signed   By: Obie Dredge M.D.   On: 10/25/2022 09:32   MR Lumbar Spine W Wo Contrast  Result Date: 10/25/2022 CLINICAL DATA:  Worsening neck and mid back pain. History of multiple myeloma. EXAM: MRI CERVICAL, THORACIC AND LUMBAR SPINE WITHOUT AND WITH CONTRAST TECHNIQUE: Multiplanar and multiecho pulse sequences of the cervical spine, to include the craniocervical junction and cervicothoracic junction, and thoracic and lumbar spine, were obtained without and with intravenous contrast. CONTRAST:  10mL GADAVIST GADOBUTROL 1 MMOL/ML IV SOLN COMPARISON:  CT chest dated Sep 29, 2022. CT neck dated August 02, 2022. MRI thoracic and lumbar spine dated May 29, 2022. MRI cervical spine dated May 27, 2022. FINDINGS: MRI CERVICAL SPINE FINDINGS Alignment: Straightening of the normal cervical lordosis. Vertebrae: Unchanged small enhancing lesion within the right C7 inferior articular process. No acute fracture or evidence of discitis. Cord: Normal signal and morphology. Posterior Fossa, vertebral arteries, paraspinal tissues: Rim enhancing midline partially loculated fluid collection extending from C7-T4, not significantly changed since April 1. Disc levels: C2-C3: Negative disc. Unchanged severe  left and moderate right facet arthropathy. Unchanged moderate left neuroforaminal stenosis. No spinal canal or right neuroforaminal stenosis. C3-C4: Negative disc. Unchanged facet joint ankylosis. Unchanged moderate left neuroforaminal stenosis. No spinal canal or right neuroforaminal stenosis. C4-C5: Unchanged mild disc bulging with moderate right-greater-than-left facet uncovertebral hypertrophy. Unchanged mild spinal canal stenosis with severe right and moderate left neuroforaminal stenosis. C5-C6: Unchanged posterior disc osteophyte complex, uncovertebral ankylosis, and mild bilateral facet arthropathy. Unchanged mild spinal canal and mild-to-moderate bilateral neuroforaminal stenosis. C6-C7: Unchanged small posterior disc osteophyte complex and moderate bilateral facet arthropathy. Unchanged mild bilateral neuroforaminal stenosis. No spinal canal stenosis. C7-T1: Negative disc. Unchanged bilateral facet arthropathy. No stenosis. MRI THORACIC SPINE FINDINGS Alignment:  Mildly exaggerated upper thoracic kyphosis is unchanged. Vertebrae: Postsurgical changes and susceptibility artifact related to prior T2 corpectomy and T1-T4 posterior fusion. Abnormal marrow signal within the residual T2 vertebral body consistent with residual tumor. Residual left-sided bony retropulsion with flattening of the left cord and mild left-sided spinal canal stenosis. The spinal canal otherwise appears patent at this level based on the sagittal cervical spine sequences. Small enhancing lesions identified within the T6, T8, and T10 vertebral bodies, as well as the T11 spinous process, not significantly changed. No acute fracture or evidence of discitis. Cord:  Normal signal. Paraspinal and other soft tissues: Unchanged bone lesions in the posterior right sixth and eighth ribs. Trace bilateral pleural effusions. Disc levels: Tiny T7-T8 and small T8-T9 central disc protrusions. No degenerative spinal canal or neuroforaminal stenosis. MRI  LUMBAR SPINE FINDINGS Segmentation:  Standard. Alignment:  Physiologic. Vertebrae: Unchanged enhancing lesions in the left L1 and L2 pedicles, L2, L3,  L4, and L5 vertebral bodies, sacrum, and posterior iliac bones. No definite new lesion. No acute fracture or evidence of discitis. Conus medullaris and cauda equina: Conus extends to the L1 level. Conus and cauda equina appear normal. Paraspinal and other soft tissues: Negative. Disc levels: T12-L1:  Negative. L1-L2:  Negative. L2-L3: Unchanged mild disc bulging and bilateral facet arthropathy. No stenosis. L3-L4: Unchanged mild-to-moderate disc bulging. Interval enlargement of the central disc herniation with new extruded components migrating superiorly and inferiorly. Unchanged mild bilateral facet arthropathy. Progressive moderate spinal canal stenosis. No neuroforaminal stenosis. L4-L5: Unchanged mild disc bulging and bilateral facet arthropathy. Unchanged mild right-greater-than-left lateral recess stenosis. No spinal canal or neuroforaminal stenosis. L5-S1: Unchanged mild disc bulging with superimposed small right paracentral disc protrusion and bulky left foraminal disc osteophyte complex. Unchanged mild bilateral facet arthropathy. Unchanged mild left neuroforaminal stenosis. No spinal canal or right neuroforaminal stenosis. IMPRESSION: CERVICAL SPINE: 1. Unchanged small enhancing lesion within the right C7 inferior articular process. No new osseous lesion or pathologic fracture. 2. Unchanged multilevel cervical spondylosis as described above. THORACIC SPINE: 1. Postsurgical changes related to prior T2 corpectomy and T1-T4 posterior fusion. Mild residual left-sided bony retropulsion at T2 flattens the left ventral cord without cord signal abnormality or high-grade stenosis. 2. Unchanged small enhancing lesions within the T6, T8, and T10 vertebral bodies, as well as the T11 spinous process. No new osseous lesion or pathologic fracture. 3. Posterior midline  partially loculated fluid collection extending from C7-T4, not significantly changed since April 1, likely a seroma. 4. Trace bilateral pleural effusions. LUMBAR SPINE: 1. Unchanged multiple enhancing lesions within the lumbar spine, sacrum, and posterior iliac bones. No new osseous lesion or pathologic fraction. 2. Interval enlargement of the central disc herniation at L3-L4 with new extruded components migrating superiorly and inferiorly. Progressive moderate spinal canal stenosis. Electronically Signed   By: Obie Dredge M.D.   On: 10/25/2022 09:32   CT ABDOMEN PELVIS W CONTRAST  Result Date: 10/25/2022 CLINICAL DATA:  Abdominal pain, acute, nonlocalized. EXAM: CT ABDOMEN AND PELVIS WITH CONTRAST TECHNIQUE: Multidetector CT imaging of the abdomen and pelvis was performed using the standard protocol following bolus administration of intravenous contrast. RADIATION DOSE REDUCTION: This exam was performed according to the departmental dose-optimization program which includes automated exposure control, adjustment of the mA and/or kV according to patient size and/or use of iterative reconstruction technique. CONTRAST:  OMNIPAQUE IOHEXOL 300 MG/ML  SOLN COMPARISON:  05/14/2015 and chest CT 09/29/2022 FINDINGS: Lower chest: Dependent densities at both lung bases. Trace right pleural fluid. Hepatobiliary: Again noted is a small cluster of cysts in the inferior right hepatic lobe, largest measures up to 2.9 cm. No suspicious hepatic lesions. Main portal venous system is patent. No biliary dilatation. Normal appearance of the gallbladder. Pancreas: Again noted are at least 2 cystic lesions involving the pancreatic tail that were present on the CT of the chest from 09/29/2022 and 03/15/2022 but new since 2017. The largest pancreatic cystic lesion measures 3.0 x 2.1 x 2.4 cm on image 29/7. Difficult to exclude interval enlargement since 2023 because this area was incompletely evaluated 2023. No evidence for  pancreatic dilatation or inflammation. Spleen: Spleen size is normal.  No suspicious splenic lesions. Adrenals/Urinary Tract: Normal adrenal glands. Two stones in the left kidney, largest measures approximately 4 mm in the upper pole region. Small low-density structures in both kidneys probably represent cysts but too small to definitively characterize. Multiple small nonobstructive right renal calculi. No hydronephrosis. No ureter stones.  Normal appearance of the urinary bladder. Stomach/Bowel: Normal appearance of the stomach and duodenum. No bowel dilatation or obstruction. Scattered colonic diverticula without acute bowel inflammation. Normal appendix. Vascular/Lymphatic: Aortic atherosclerosis. No enlarged abdominal or pelvic lymph nodes. Reproductive: Prostate is unremarkable. Other: Bilateral inguinal hernias containing fat, right side greater than left. Negative for free fluid in the abdomen or pelvis. Negative for free air. Small umbilical hernia containing fat. Musculoskeletal: Numerous small lucent lesions throughout the bones particularly in the pelvis. Right hip arthroplasty is located. Large area of lucency involving the right superior acetabular region. These findings are compatible with history of myeloma. Vertebral body heights in the lumbar spine and lower thoracic spine are maintained. Disc space narrowing in the lower lumbar spine particularly at L4-L5 and posterior aspect of L5-S1. IMPRESSION: 1. No acute abnormality in the abdomen or pelvis. 2. Bilateral nonobstructive renal calculi. 3. Cystic lesions involving the pancreatic tail. Largest measures up to 3 cm. These are new since 2017 but present since 03/15/2022. These could represent cystic neoplasms. Recommend further characterization with a MRI of the abdomen with and without contrast. 4. Numerous lucent lesions throughout the bones compatible with history of myeloma. No evidence for a pathologic fracture. 5. Bilateral inguinal hernias  containing fat. 6. Trace right pleural fluid. 7. Aortic Atherosclerosis (ICD10-I70.0). Electronically Signed   By: Richarda Overlie M.D.   On: 10/25/2022 09:01   CT L-SPINE NO CHARGE  Result Date: 10/25/2022 CLINICAL DATA:  Fall, leg weakness.  History of multiple myeloma EXAM: CT LUMBAR SPINE WITHOUT CONTRAST TECHNIQUE: Multidetector CT imaging of the lumbar spine was performed without intravenous contrast administration. Multiplanar CT image reconstructions were also generated. RADIATION DOSE REDUCTION: This exam was performed according to the departmental dose-optimization program which includes automated exposure control, adjustment of the mA and/or kV according to patient size and/or use of iterative reconstruction technique. COMPARISON:  MRI 05/29/2022 FINDINGS: Segmentation: 5 lumbar type vertebrae. Alignment: Mild levocurvature.  No significant listhesis. Vertebrae: Innumerable lytic lesions throughout the lumbar vertebral bodies as well as throughout the sacrum and visualized posterior pelvis with dominant lesions centered in the left L1 and L2 pedicles as well as a lesion within the posterior right iliac bone. Overall distribution is similar to the previous MRI. No pathologic fracture is identified. No definite extraosseous soft tissue tumor. Paraspinal and other soft tissues: Aortic atherosclerosis. No acute findings. Disc levels: Mild-to-moderate degenerative disc disease within the lower lumbar spine most pronounced at L4-L5. Please see recently obtained lumbar spine MRI for full level by level detail. IMPRESSION: Innumerable lytic lesions throughout the lumbar vertebral bodies as well as throughout the sacrum and visualized posterior pelvis with dominant lesions centered in the left L1 and L2 pedicles as well as a lesion within the posterior right iliac bone. Overall distribution is similar to the previous MRI and compatible with known multiple myeloma. No pathologic fracture is identified. Aortic  Atherosclerosis (ICD10-I70.0). Electronically Signed   By: Duanne Guess D.O.   On: 10/25/2022 08:44   CT Thoracic Spine Wo Contrast  Result Date: 10/25/2022 CLINICAL DATA:  Weakness.  History of multiple myeloma EXAM: CT THORACIC SPINE WITHOUT CONTRAST TECHNIQUE: Multidetector CT images of the thoracic were obtained using the standard protocol without intravenous contrast. RADIATION DOSE REDUCTION: This exam was performed according to the departmental dose-optimization program which includes automated exposure control, adjustment of the mA and/or kV according to patient size and/or use of iterative reconstruction technique. COMPARISON:  CT 06/29/2022.  MRI 10/23/2022, chest CT 03/15/2022 FINDINGS:  Alignment: Mildly exaggerated thoracic kyphosis. No static listhesis. Vertebrae: Postsurgical changes of T1-T4 fusion and posterior decompression with T2 corpectomy. Slight anterior positioning of the corpectomy spacer is unchanged from prior. No perihardware lucency. Innumerable lytic lesions of varying size throughout the thoracic vertebral bodies and involving the posterior elements of multiple levels. Subtle chronic pathologic fracture involving the inferior endplate of T10, unchanged in appearance from the chest CT of 03/15/2022. No new pathologic fracture. Mildly expansile lytic lesion within the posterior right sixth rib. Flowing anterior endplate osteophytes spanning from T3-T11 compatible with diffuse idiopathic skeletal hyperostosis. Paraspinal and other soft tissues: Trace right pleural effusion. Dependent bibasilar atelectasis. No acute findings. Disc levels: Intervertebral disc heights are preserved. No significant facet arthropathy. IMPRESSION: 1. Postsurgical changes of T1-T4 fusion and posterior decompression with T2 corpectomy. No evidence of hardware complication. 2. Innumerable lytic lesions of varying size throughout the thoracic spine compatible with known multiple myeloma. No new pathologic  fracture. 3. Trace right pleural effusion. Electronically Signed   By: Duanne Guess D.O.   On: 10/25/2022 08:39    ASSESSMENT & PLAN Travis [name] is a 72 y.o. male with medical history significant for IgA Kappa Multiple Myeloma who presents for a follow up visit.   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  He meets diagnostic criteria by having a serum free light chain ratio greater than 100 and lytic lesions causing pathological fracture of the humerus.  Given this I would recommend we pursued Darzalex, Revlimid, and dexamethasone.  Given his advanced age I would not recommend transplantation, that this is something we can consider when it comes time to transition to maintenance therapy versus transplant.  The patient voices understanding of the plan moving forward.  # IgA Kappa Multiple Myeloma --Metastatic survey completed 12/15/2021 --Patient underwent fixation of his humerus on 12/17/2021, pathology confirmed plasmacytoma/plasma cell neoplasm.  --Received 6 cycles of Darzalex, Revlimid, and dexamethasone started on 12/28/2021-05/18/2022.  --Due to worsening myeloma osseous lesions including pathologic fracture at T2 with cord compression, Dr. Leonides Schanz recommends changing therapy to Velcade/Pomalyst/Dex.  Plan: --Due for Cycle 5, Day 8 of Velcade/Pom/Dex today --labs from today showed white blood cell 8.4, Hgb 10.5, MCV 98.2, Plt 182 --restaging labs from 10/06/2022 with SPEP and SFLC showed M protein not detectable. Kappa increased to 44.5 (from 24.2), Lambda 13.5, Ratio 3.30 --Pomalyst was dose reduced to 2 mg p.o. due to cytopenias. Recommend to continue --RTC in 2 weeks with interval weekly treatments.   #Neck pain and mid back pain: --Suspect pain is exacerbated from recent hospitalization and being in bed/sitting for long periods.  --Due to history of lytic lesions with cord  compression, we will obtain MRI C/T/L spine to rule out new or worsening disease.   #Palliative Radiation Therapy: # Dysphagia --Received palliative radiation from 01/06/2022-01/19/2022 to right hymerus, right forearm and right chest/rib, 20 Gy in 10 Fx.  --recieved palliative radiation to surgical bed in the upper thoracic spine area on 07/13/2022.  27 Gy in 9 Fx.   # Rash Concerning for Shingles-resolved -- Prescribed valacyclovir 3 times daily 1000 mg x 10 days-completed the course -- We will continue to monitor rash closely.  #DVT: --Found to have acute DVT involving left peroneal veins. Likely secondary to recent surgery and hospitalization --Currently on eliquis 5 mg BID.    #Hypokalemia: --Currently on potassium chloride 60 mEq per day as prescribed but takes three  times a day (60 mEq) on the days he takes Metolazine.  --Potassium level is 3.7 today. No further intervention needed.   #Rash on scalp and right forearm: --Okay to try topical hydrocortisone cream for spot treatment  #Supportive Care -- chemotherapy education complete -- port placement not required -- zofran 8mg  q8H PRN and compazine 10mg  PO q6H for nausea -- acyclovir 400mg  PO BID for VCZ prophylaxis -- Started Zometa q 12 weeks on 08/18/2022. Next dose July 2024.   No orders of the defined types were placed in this encounter.  All questions were answered. The patient knows to call the clinic with any problems, questions or concerns.  A total of more than 30 minutes were spent on this encounter with face-to-face time and non-face-to-face time, including preparing to see the patient, ordering tests and/or medications, counseling the patient and coordination of care as outlined above.   Ulysees Barns, MD Department of Hematology/Oncology [facility] at [facility] Phone: [phone] Pager: [phone] Email: [email or web address]  11/10/2022 11:41 AM

## 2022-11-09 NOTE — Telephone Encounter (Signed)
CBC, CMET, BNP and TSH

## 2022-11-09 NOTE — Therapy (Signed)
OUTPATIENT PHYSICAL THERAPY ONCOLOGY TREATMENT  Patient Name: Travis [name] MRN: [id number] DOB:08-24-1950, 72 y.o., male Today's Date: 11/09/2022   END OF SESSION:  PT End of Session - 11/09/22 1538     Visit Number 12    Number of Visits 23    Date for PT Re-Evaluation 12/20/22    PT Start Time 1525    PT Stop Time 1608    PT Time Calculation (min) 43 min    Activity Tolerance Patient tolerated treatment well    Behavior During Therapy WFL for tasks assessed/performed              Past Medical History:  Diagnosis Date   A-fib (HCC)    Arthritis    "comes and goes; mostly in hands, occasionally elbow" (04/05/2017)   Complication of anesthesia    "just wanted to sleep alot  for couple days S/P VARICOCELE OR" (04/05/2017)   DVT (deep venous thrombosis) (HCC)    LLE   Dysrhythmia    PVCs    GERD (gastroesophageal reflux disease)    History of hiatal hernia    History of radiation therapy    Thoracic Spine- 07/13/22-07/23/22-Dr. Antony Blackbird   Hypertension    Impaired glucose tolerance    Multiple myeloma (HCC)    Obesity (BMI 30-39.9) 07/26/2016   OSA on CPAP 07/26/2016   Mild with AHI 12.5/hr now on CPAP at 14cm H2O   PAF (paroxysmal atrial fibrillation) (HCC)    s/p PVI Ablation in 2018 // Flecainide Rx // Echo 8/21: EF 60-65, no RWMA, mild LVH, normal RV SF, moderate RVE, mild BAE, trivial MR, mild dilation of aortic root (40 mm)   Pneumonia    "may have had walking pneumonia a few years ago" (04/05/2017)   Pre-diabetes    Thrombophlebitis    Past Surgical History:  Procedure Laterality Date   ATRIAL FIBRILLATION ABLATION  04/05/2017   ATRIAL FIBRILLATION ABLATION N/A 04/05/2017   Procedure: ATRIAL FIBRILLATION ABLATION;  Surgeon: [name], MD;  Location: [facility];  Service: Cardiovascular;  Laterality: N/A;   BONE BIOPSY Right 12/17/2021   Procedure: BONE BIOPSY;  Surgeon: [name], MD;  Location: [facility];  Service:  Orthopedics;  Laterality: Right;   COMPLETE RIGHT HIP REPLACMENT  01/19/2019   EVALUATION UNDER ANESTHESIA WITH HEMORRHOIDECTOMY N/A 02/24/2017   Procedure: EXAM UNDER ANESTHESIA WITH  HEMORRHOIDECTOMY;  Surgeon: [name], MD;  Location: [facility];  Service: General;  Laterality: N/A;   EXCISIONAL TOTAL HIP ARTHROPLASTY WITH ANTIBIOTIC SPACERS Right 09/25/2020   Procedure: EXCISIONAL TOTAL HIP ARTHROPLASTY WITH ANTIBIOTIC SPACERS;  Surgeon: [name], MD;  Location: [facility];  Service: Orthopedics;  Laterality: Right;   FLEXIBLE SIGMOIDOSCOPY N/A 04/15/2015   Procedure:  UNSEDATED FLEXIBLE SIGMOIDOSCOPY;  Surgeon: [name], MD;  Location: [facility];  Service: Endoscopy;  Laterality: N/A;   HUMERUS IM NAIL Right 12/17/2021   Procedure: INTRAMEDULLARY (IM) NAIL HUMERAL;  Surgeon: [name], MD;  Location: [facility];  Service: Orthopedics;  Laterality: Right;   KNEE ARTHROSCOPY Left ~ 2016   POSTERIOR CERVICAL FUSION/FORAMINOTOMY N/A 06/04/2022   Procedure: Thoracic one - Thoracic Four Posterior lateral arthrodesis/ fusion and Thoracic two Corpectomy;  Surgeon: [name], MD;  Location: [facility];  Service: Neurosurgery;  Laterality: N/A;   REIMPLANTATION OF TOTAL HIP Right 12/25/2020   Procedure: REIMPLANTATION/REVISION OF RIGHT TOTAL HIP VERSUS REPEAT IRRIGATION AND DEBRIDEMENT;  Surgeon: [name], MD;  Location: [facility];  Service: Orthopedics;  Laterality: Right;   TESTICLE SURGERY  1988   VARICOCELE   TONSILLECTOMY     VARICOSE VEIN SURGERY Bilateral    Patient Active Problem List   Diagnosis Date Noted   Pancreatic cyst 09/30/2022   Infection due to novel influenza A virus 09/29/2022   Persistent atrial fibrillation (HCC) 07/15/2022   Acute on chronic diastolic heart failure (HCC) 07/03/2022   Pulmonary infiltrates 07/01/2022   Acute respiratory failure with hypoxia (HCC) 07/01/2022   Hyponatremia 06/29/2022   History of thoracic surgery 06/29/2022    Acute DVT of left tibial vein (HCC) 06/21/2022   Hypokalemia 06/21/2022   Vitamin D deficiency 05/30/2022   Other constipation 05/29/2022   Pathologic compression fracture of spine, initial encounter (HCC) 05/29/2022   Cord compression (HCC) 05/28/2022   Degenerative cervical disc 05/14/2022   Hypercoagulable state due to persistent atrial fibrillation (HCC) 01/11/2022   Multiple myeloma (HCC) 12/10/2021   Medication monitoring encounter 02/27/2021   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   Status post peripherally inserted central catheter (PICC) central line placement 11/06/2020   Drug rash with eosinophilia and systemic symptoms syndrome 11/06/2020   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   PAF (paroxysmal atrial fibrillation) (HCC)    GERD (gastroesophageal reflux disease) 03/29/2018   Hiatal hernia 03/29/2018   History of DVT (deep vein thrombosis) 03/29/2018   CAP (community acquired pneumonia) 03/29/2018   Osteoarthritis 03/29/2018   Neuropathy 08/29/2017   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   Paroxysmal atrial fibrillation with rapid ventricular response (HCC) 04/05/2017   Prolapsed internal hemorrhoids, grade 4, s/p ligation/pexy/hemorrhoidectomy x 2 02/24/2017 02/24/2017   OSA (obstructive sleep apnea) 07/26/2016   Obesity (BMI 30-39.9) 07/26/2016   Atrial fibrillation with RVR (HCC)    Essential hypertension    Chest pain at rest     PCP: Eleanora Neighbor, MD  REFERRING PROVIDER: Antony Blackbird, MD  REFERRING DIAG: Multiple Myeloma  THERAPY DIAG:  Compression fracture of thoracic vertebra, unspecified thoracic vertebral level, initial encounter (HCC)  Multiple myeloma not having achieved remission (HCC)  Muscle weakness (generalized)  Abnormal posture  Neck pain  ONSET DATE: 2023  Rationale for Evaluation and Treatment: Rehabilitation  SUBJECTIVE:                                                                                                                                                                                            SUBJECTIVE STATEMENT:  Pt reports he is feeling better than he did on Friday.  He said last   PERTINENT HISTORY:  Travis [name] 72 y.o. male with medical history  significant for IgA Kappa Multiple Myeloma. He was diagnosed with smouldering myeloma in 2018 . Last July he developed right arm pain due to a pathologic fx and he underwent radiation and fixation. In early Feb 2024 he had radiation for pathologic T2 compression fx followed by a T1-T4 fusion. PAIN:  Are you having pain? Are you having pain? yes NPRS scale: 2/10 Pain location: shoulder blades and neck, bilateral thoracic paraspinals, Right upper arm Pain orientation: Bilateral  PAIN TYPE: aching and tight Pain description: constant  Aggravating factors: getting up and down, looking down Relieving factors: sleeping, laying down.   PRECAUTIONS: Multiple Myeloma with mets to R ribs, R humerus with ORIF for pathological fracture 12/17/21. Lesions have also been found in R rib and also in his skull. Pathologic fx T2 with fusion T1-4 on 06/04/22, Bilateral LE edema (on Lasix), Afib   WEIGHT BEARING RESTRICTIONS: No  FALLS:  Has patient fallen in last 6 months? Yes. Number of falls 1  LIVING ENVIRONMENT: Lives with: lives with their family and lives with their spouse Lives in: House/apartment Stairs: Yes; Internal: 14 steps; on left going up and External: 3 steps; on right going up Has following equipment at home: Single point cane and Walker - 4 wheeled  OCCUPATION: attorney/ mostly retired?  LEISURE: golf (can't do now.), use to play racquetball  HAND DOMINANCE: right   PRIOR LEVEL OF FUNCTION: Independent  POSTURE:significant slump sitting posture with forward head  PATIENT GOALS: decrease pain,get stronger,improve balance   OBJECTIVE:  COGNITION: Overall cognitive status: Within functional limits for tasks  assessed   PALPATION: Very tender bilateral UT/levator, scapular regions  OBSERVATIONS / OTHER ASSESSMENTS: very flexed head and neck with chin nearly at chest  SENSATION: Light touch: Appears intact    POSTURE: significant forward head, round shoulders, increased kyphosis   Gait holding single point cane in left hand but not using. Right shoulder low, flexed neck;difficulty raising head to see where he is going  UPPER EXTREMITY AROM/PROM:  A/PROM RIGHT   eval  RIGHT 11/08/2022  Shoulder extension    Shoulder flexion 91 with compensation 101 with compensation   Shoulder abduction 67 with compensation 75 with compensation  Shoulder internal rotation    Shoulder external rotation C6     (Blank rows = not tested)  A/PROM LEFT   eval  Shoulder extension   Shoulder flexion 123  Shoulder abduction 112  Shoulder internal rotation T7  Shoulder external rotation t3    (Blank rows = not tested)  CERVICAL AROM: All within normal limits:    Percent limited 11/08/22  Flexion Fully flexed   Extension From flexed position decreased 80% Decreased 70%  Right lateral flexion    Left lateral flexion    Right rotation Dec 50% sitting in FH posture   Left rotation        Dec 50%     UPPER EXTREMITY STRENGTH:    LOWER EXTREMITY AROM/PROM:  A/PROM Right eval  Hip flexion   Hip extension   Hip abduction   Hip adduction   Hip internal rotation   Hip external rotation   Knee flexion   Knee extension   Ankle dorsiflexion   Ankle plantarflexion   Ankle inversion   Ankle eversion    (Blank rows = not tested)  A/PROM LEFT eval  Hip flexion   Hip extension   Hip abduction   Hip adduction   Hip internal rotation   Hip external rotation   Knee flexion  Knee extension   Ankle dorsiflexion   Ankle plantarflexion   Ankle inversion   Ankle eversion    (Blank rows = not tested)  LOWER EXTREMITY MMT:    10/04/2022  4 position balance test; able to hold DLS feet together  , and half tandem stance 10 seconds. Unable to maintain full tandem stance for 10 sec, unable to hold SLS greater than a second or 2 on left, and 4 seconds on right.     LYMPHEDEMA ASSESSMENTS:   SURGERY TYPE/DATE: 8/17/23Left prox humerus fixation for pathologic fx  2/2/2024Fusion T1-T4 for pathologic fx with cord compression 06/04/22 posterior cervical fusion/foraminotomy;T1-4 fusion NUMBER OF LYMPH NODES REMOVED: NA  CHEMOTHERAPY: Velcade/promalyst/Dex;  RADIATION:to prox humerus and thoracic spine pathologic fx  HORMONE TREATMENT: NA  INFECTIONS: NA  FUNCTIONAL TESTS:  10/09/2022;30 second sit to stand 10 with heavy use of arms  GAIT: Distance walked: 20 Assistive device utilized: carried SPC in left Level of assistance: Complete Independence Comments: feels unsteady, very flexed neck posture   QUICK DASH SURVEY:    TODAY'S TREATMENT:                                                                                                                                         DATE:  11/09/22 Pt seen for aquatic therapy today.  Treatment took place in water 3.5-4.75 ft in depth at the [facility] pool. Temp of water was 91.  Pt entered/exited the pool via stairs with bilat rail independently Upon arrival:  HR 58-62, spO2 96%. *With UE on yellow hand floats:  walking forward and backward with row motion with arms * side stepping with UE support on rainbow hand floats, then side stepping with arm addct/abdct -> moved to yellow hand floats * staggered stance with bilat shoulder horiz abdct/ addct x 5 each LE forward * tricep push down with yellow hand floats x 10  * UE support on yellow hand floats: SLS with 3 way toe touch x 5 each side * return to walking forward/ backward with row motion of arms * UE support on yellow hand floasts: tandem stance x 20sec each LE forward; tandem gait forward/ backward  * staggered stance with bilat shoulder horiz abdct/ addct x 5 each LE  forward * seated at bench - cycling, alternating LAQ with DF, and long sitting hip abdct/ addct;  STS with forward arm reach x 10 * at stairs: supported back float with noodle behind back (under arms), under knees, and nekdoodle;  CGA to transition up to sitting on stairs from back float.     11/08/2022 O2 97, HR 55 and regularly Nustep UE 7, LE 12, lev 5 x 7 min Assessed shoulder and cervical ROM, tested tandem stance balance and 30 second sit to stand 30 sec Sit to stand 10 using arms, O2 97, HR 61 Tandem stance x 3 B, able to  maintain Right foot forward x 10 sec but not left forward Sidestepping in bars x 3 laps Partial step ups x 15 B SLS x 5 ea to failure Mini squats x 10 o2 94, HR 65 Showed how to perform mini squats to high bed for better form STM to bil UT/upper scap region and cervical muscles and bilateral paraspinal muscles in prop sitting with cocoa butter 11/01/22 NuStep Level 5 seat 12, UE 7 x 4 mins; HR 70-80, allowed to rest;repeated x 4 min HR 70-100 intermittently., 687, RPE 3 Pt in gait belt Heel raises x 15 light HH Marching x 10 B intermittent light HH Sidestepping in bars x 4 laps RPE 1, HR 81-111 Step ups on ax forward and sideways x 10 with CGA but no LOB Instructed and demonstrated to pt supine to and from sit on mat table Bridging 2 x 5  Supine cervical retraction into pillow with PT hands guiding x 10 STM to bil UT/upper scap region and cervical muscles and bilateral paraspinal muscles in prop sitting with cocoa butter  10/29/22: Pt arrives for aquatic physical therapy. Treatment took place in 3.5-5.5 feet of water. Water temperature was 90 degrees F. Pt entered the pool via stairs, step to step slowly with heavy use of rails.Pt requires buoyancy of water for support and to offload joints with strengthening exercises.   Seated water bench with 75% submersion Pt performed seated LE AROM exercises 20x in all planes, while discussing current  status/pain/symptoms/and how we should modify todays treatment. 75% depth water walking with bar bell for postural support. VC to lift chin for one length/relax muscles walking back. 6x in each direction. Static stance unassisted: mitten hand arm presses frwd/bk 10x. Small step forward/wide tandem stance 15 sec 2x Bil. CGA. Vc to lift chin. VC to lift chin. Horizontal decompression float with 100% flotation and PTA providing lateral sway to mobilize trunk and soft tissue work along the scar.    10/28/2022  NuStep Level 5 seat 12/UE 7 x 8 mins;  Pt in gait belt Heel raises x 15 light HH Marching intermittent light HH Sidestepping 4 laps in bars with no HH, but CGA of PT Forward stepping on ax x 10 ea, LAQ 3# 2 x 10 ea 02 98, HR 122 STM to bil UT/upper scap region and cervical muscles and bilateral paraspinal muscles in prop sitting with cocoa butter VC required throughout for proper posture/head position At end of rx 02 98, HR varying between 112 and 60  10/20/22:Pt arrives for aquatic physical therapy. Treatment took place in 3.5-5.5 feet of water. Water temperature was 90 degrees F. Pt entered the pool via stairs, step to step slowly with heavy use of rails.Pt requires buoyancy of water for support and to offload joints with strengthening exercises.   Seated water bench with 75% submersion Pt performed seated LE AROM exercises 20x in all planes, while discussing current status/pain/symptoms/and how we should modify todays treatment. 75% depth water walking with bar bell for postural support. VC to lift chin for one length/relax muscles walking back. 6x in each direction. Pt demonstrates less unsteadiness today. Static stance unassisted: mitten hand arm presses frwd/bk 10x. Pt unsteady. Vc to lift chin. Single buoy UE weights for horizontal abd/add 10x. VC to lift chin. Horizontal decompression float with 100% flotation and PTA providing lateral sway to mobilize trunk and soft tissue work along  the scar.   10/18/2022 NuStep Level 5 seat 12/UE 7 x 8 mins; 569 steps In parallel  bars: Standing heel raises x 12 light hand touch, marching light hand touch 2 x 10 Sidestepping 3 lengths and return intermittent light touch Sit to stand 2 x 5 with 1 hand A Step ups on ax x 10 ea forward and sideways B Vector reaches 2 x 5  B Ambulation on beam forward 6 lengths and sideways 6 length;no HH to light HH STM to bil UT/upper scap region and cervical muscles and bilateral paraspinal muscles in prop sitting with cocoa butter VC required throughout for proper posture/head position   PATIENT EDUCATION:  Education details: sitting posture with lumbar roll Person educated: Patient Education method: Medical illustrator Education comprehension: verbalized understanding and returned demonstration  HOME EXERCISE PROGRAM: Doing HEP from HHPT; cervical ROM, shoulder rolls, shrugs, scap retraction, UT stretches  Aquatic Access Code: [email or web address] URL: [email or web address] Date: 11/09/2022 Prepared by: [facility] [address] This aquatic home exercise program from [employer] utilizes pictures from land based exercises, but has been adapted prior to lamination and issuance.    ASSESSMENT:  CLINICAL IMPRESSION:  Pt confident in aquatic environment and able to take direction from therapist on deck.  He tolerated aquatic exercises well, without increase in pain.  He reported RPE  reported as 3-4/10 during session. He was issued a laminated aquatic HEP today; verbalized understanding of all exercises presented.  Discussed pt beginning to do these exercises outside of therapy sessions.  Pt is progressing towards remaining LTGs.     Pt has been compliant with his HEP for improving his posture and for strengthening. He continues to have complaints of upper back discomfort/tightness since his surgery and difficulty getting his head to an erect posture although he has  made some minor improvement. His right shoulder ROM has also improved slightly but continues with compensation. He was able to maintain tandem stance balance with right foot forward x 10 seconds but still has considerable difficulty with left foot forward.  SLS is decreased bilaterally however he is improving with shifting his wt over the stance leg. .. He demonstrates improved strength by indicating he can now go up and down stairs with reciprocal gait, although it is still difficult. He has difficulty with sit to stand and is below average with 10 reps in 30 seconds with excessive use of hands He will continue to benefit from skilled therapy to address deficits and return pt to a more active and safe lifestyle.  OBJECTIVE IMPAIRMENTS: decreased activity tolerance, decreased balance, difficulty walking, decreased ROM, decreased strength, increased edema, impaired UE functional use, postural dysfunction, and pain.   ACTIVITY LIMITATIONS: carrying, lifting, dressing, reach over head, and locomotion level  PARTICIPATION LIMITATIONS: cleaning, laundry, driving, and occupation  PERSONAL FACTORS:  Multiple Myeloma  are also affecting patient's functional outcome.   REHAB POTENTIAL: Good  CLINICAL DECISION MAKING: Evolving/moderate complexity  EVALUATION COMPLEXITY: Moderate  GOALS: Goals reviewed with patient? Yes  SHORT TERM GOALS: Target date: 10/21/22  Pt will be educated in and independent in proper sitting posture using lumbar roll prn Baseline: Goal status; MET 11/08/22 2.  Pt will report decreased muscle soreness in bilateral upper back x 25% Baseline:  Goal status: ONGOING  3.  Pt will be compliant with a HEP for postural strengthening/ROM Baseline:  Goal status: MET / LONG TERM GOALS: Target date: 12/20/22  Pt will be able to bring his head to neutral position for safety with ambulation/driving Baseline:  Goal status:Ongoing 2.  Pt will have decreased upper back pain by 50%  or  more Baseline:  Goal status:Ongoing 3.  Pt will be able to flex right shoulder to 115 degrees with minimal compensation for improved reaching ability Baseline:  Goal status: ONGOING 4; Pt will be able to maintain tandem stance on both sides x 10 sec to demonstrated improved balance. Goal status:MET right foot forward 11/08/2022, Ongoing left foot forward  5. Pt will be able to perform sit to stand x 5 without use of Ue's  Baseline:must use hands  Goal status:New  PT FREQUENCY: 2x/week  PT DURATION: 6 weeks  PLANNED INTERVENTIONS: Therapeutic exercises, Therapeutic activity, Neuromuscular re-education, Balance training, Gait training, Patient/Family education, Self Care, Joint mobilization, Aquatic Therapy, Manual therapy, and Re-evaluation  PLAN FOR NEXT SESSION: Cont land and aquatic exercises working on posture, strength, balance, STM prn for pain  Mayer Camel, PTA 11/09/22 4:58 PM [facility] [address] [place], [place], [address] Phone: [phone]   Fax:  [phone]

## 2022-11-10 ENCOUNTER — Ambulatory Visit (INDEPENDENT_AMBULATORY_CARE_PROVIDER_SITE_OTHER): Payer: Medicare Other

## 2022-11-10 ENCOUNTER — Inpatient Hospital Stay: Payer: Medicare Other

## 2022-11-10 ENCOUNTER — Ambulatory Visit: Payer: Medicare Other | Attending: Physician Assistant

## 2022-11-10 ENCOUNTER — Other Ambulatory Visit: Payer: Medicare Other

## 2022-11-10 ENCOUNTER — Inpatient Hospital Stay (HOSPITAL_BASED_OUTPATIENT_CLINIC_OR_DEPARTMENT_OTHER): Payer: Medicare Other | Admitting: Hematology and Oncology

## 2022-11-10 ENCOUNTER — Ambulatory Visit: Payer: Medicare Other

## 2022-11-10 ENCOUNTER — Ambulatory Visit: Payer: Medicare Other | Admitting: Hematology and Oncology

## 2022-11-10 ENCOUNTER — Other Ambulatory Visit: Payer: Self-pay

## 2022-11-10 VITALS — BP 145/77 | HR 53 | Temp 97.8°F | Resp 17 | Wt 251.5 lb

## 2022-11-10 DIAGNOSIS — Z515 Encounter for palliative care: Secondary | ICD-10-CM | POA: Diagnosis not present

## 2022-11-10 DIAGNOSIS — I10 Essential (primary) hypertension: Secondary | ICD-10-CM

## 2022-11-10 DIAGNOSIS — C9000 Multiple myeloma not having achieved remission: Secondary | ICD-10-CM

## 2022-11-10 DIAGNOSIS — I480 Paroxysmal atrial fibrillation: Secondary | ICD-10-CM | POA: Diagnosis not present

## 2022-11-10 DIAGNOSIS — R600 Localized edema: Secondary | ICD-10-CM

## 2022-11-10 DIAGNOSIS — E876 Hypokalemia: Secondary | ICD-10-CM | POA: Diagnosis not present

## 2022-11-10 DIAGNOSIS — I4891 Unspecified atrial fibrillation: Secondary | ICD-10-CM

## 2022-11-10 DIAGNOSIS — T451X5A Adverse effect of antineoplastic and immunosuppressive drugs, initial encounter: Secondary | ICD-10-CM

## 2022-11-10 DIAGNOSIS — M542 Cervicalgia: Secondary | ICD-10-CM | POA: Diagnosis not present

## 2022-11-10 DIAGNOSIS — M50321 Other cervical disc degeneration at C4-C5 level: Secondary | ICD-10-CM | POA: Diagnosis not present

## 2022-11-10 DIAGNOSIS — D61810 Antineoplastic chemotherapy induced pancytopenia: Secondary | ICD-10-CM

## 2022-11-10 DIAGNOSIS — M47812 Spondylosis without myelopathy or radiculopathy, cervical region: Secondary | ICD-10-CM | POA: Diagnosis not present

## 2022-11-10 DIAGNOSIS — R001 Bradycardia, unspecified: Secondary | ICD-10-CM | POA: Diagnosis not present

## 2022-11-10 DIAGNOSIS — Z79899 Other long term (current) drug therapy: Secondary | ICD-10-CM | POA: Diagnosis not present

## 2022-11-10 DIAGNOSIS — K4020 Bilateral inguinal hernia, without obstruction or gangrene, not specified as recurrent: Secondary | ICD-10-CM | POA: Diagnosis not present

## 2022-11-10 DIAGNOSIS — K5730 Diverticulosis of large intestine without perforation or abscess without bleeding: Secondary | ICD-10-CM | POA: Diagnosis not present

## 2022-11-10 DIAGNOSIS — M2578 Osteophyte, vertebrae: Secondary | ICD-10-CM | POA: Diagnosis not present

## 2022-11-10 DIAGNOSIS — M48061 Spinal stenosis, lumbar region without neurogenic claudication: Secondary | ICD-10-CM | POA: Diagnosis not present

## 2022-11-10 DIAGNOSIS — G4733 Obstructive sleep apnea (adult) (pediatric): Secondary | ICD-10-CM | POA: Diagnosis not present

## 2022-11-10 DIAGNOSIS — K862 Cyst of pancreas: Secondary | ICD-10-CM | POA: Diagnosis not present

## 2022-11-10 DIAGNOSIS — K5900 Constipation, unspecified: Secondary | ICD-10-CM | POA: Diagnosis not present

## 2022-11-10 DIAGNOSIS — K7689 Other specified diseases of liver: Secondary | ICD-10-CM | POA: Diagnosis not present

## 2022-11-10 DIAGNOSIS — I5033 Acute on chronic diastolic (congestive) heart failure: Secondary | ICD-10-CM | POA: Diagnosis not present

## 2022-11-10 DIAGNOSIS — D472 Monoclonal gammopathy: Secondary | ICD-10-CM

## 2022-11-10 DIAGNOSIS — K429 Umbilical hernia without obstruction or gangrene: Secondary | ICD-10-CM | POA: Diagnosis not present

## 2022-11-10 DIAGNOSIS — M47816 Spondylosis without myelopathy or radiculopathy, lumbar region: Secondary | ICD-10-CM | POA: Diagnosis not present

## 2022-11-10 DIAGNOSIS — M5117 Intervertebral disc disorders with radiculopathy, lumbosacral region: Secondary | ICD-10-CM | POA: Diagnosis not present

## 2022-11-10 DIAGNOSIS — I700 Atherosclerosis of aorta: Secondary | ICD-10-CM | POA: Diagnosis not present

## 2022-11-10 DIAGNOSIS — J90 Pleural effusion, not elsewhere classified: Secondary | ICD-10-CM | POA: Diagnosis not present

## 2022-11-10 DIAGNOSIS — I4819 Other persistent atrial fibrillation: Secondary | ICD-10-CM

## 2022-11-10 DIAGNOSIS — Z5112 Encounter for antineoplastic immunotherapy: Secondary | ICD-10-CM | POA: Diagnosis not present

## 2022-11-10 LAB — CMP (CANCER CENTER ONLY)
ALT: 19 U/L (ref 0–44)
AST: 11 U/L — ABNORMAL LOW (ref 15–41)
Albumin: 3.6 g/dL (ref 3.5–5.0)
Alkaline Phosphatase: 78 U/L (ref 38–126)
Anion gap: 5 (ref 5–15)
BUN: 31 mg/dL — ABNORMAL HIGH (ref 8–23)
CO2: 32 mmol/L (ref 22–32)
Calcium: 9.1 mg/dL (ref 8.9–10.3)
Chloride: 106 mmol/L (ref 98–111)
Creatinine: 0.96 mg/dL (ref 0.61–1.24)
GFR, Estimated: >60 mL/min (ref >60)
Glucose, Bld: 93 mg/dL (ref 70–99)
Potassium: 3.7 mmol/L (ref 3.5–5.1)
Sodium: 143 mmol/L (ref 135–145)
Total Bilirubin: 0.3 mg/dL (ref 0.3–1.2)
Total Protein: 5.9 g/dL — ABNORMAL LOW (ref 6.5–8.1)

## 2022-11-10 LAB — CBC WITH DIFFERENTIAL (CANCER CENTER ONLY)
Abs Immature Granulocytes: 0.03 10*3/uL (ref 0.00–0.07)
Basophils Absolute: 0.1 10*3/uL (ref 0.0–0.1)
Basophils Relative: 1 %
Eosinophils Absolute: 0.1 10*3/uL (ref 0.0–0.5)
Eosinophils Relative: 1 %
HCT: 33.4 % — ABNORMAL LOW (ref 39.0–52.0)
Hemoglobin: 10.5 g/dL — ABNORMAL LOW (ref 13.0–17.0)
Immature Granulocytes: 0 %
Lymphocytes Relative: 5 %
Lymphs Abs: 0.4 10*3/uL — ABNORMAL LOW (ref 0.7–4.0)
MCH: 30.9 pg (ref 26.0–34.0)
MCHC: 31.4 g/dL (ref 30.0–36.0)
MCV: 98.2 fL (ref 80.0–100.0)
Monocytes Absolute: 1.2 10*3/uL — ABNORMAL HIGH (ref 0.1–1.0)
Monocytes Relative: 15 %
Neutro Abs: 6.5 10*3/uL (ref 1.7–7.7)
Neutrophils Relative %: 78 %
Platelet Count: 182 10*3/uL (ref 150–400)
RBC: 3.4 MIL/uL — ABNORMAL LOW (ref 4.22–5.81)
RDW: 17 % — ABNORMAL HIGH (ref 11.5–15.5)
WBC Count: 8.4 10*3/uL (ref 4.0–10.5)
nRBC: 0 % (ref 0.0–0.2)

## 2022-11-10 MED ORDER — SODIUM CHLORIDE 0.9 % IV SOLN: Freq: Once | INTRAVENOUS | Status: AC

## 2022-11-10 MED ORDER — BORTEZOMIB CHEMO SQ INJECTION 3.5 MG (2.5MG/ML)
1.3000 mg/m2 | Freq: Once | INTRAMUSCULAR | Status: AC
  Administered 2022-11-10: 3 mg via SUBCUTANEOUS
  Filled 2022-11-10: qty 1.2

## 2022-11-10 MED ORDER — ZOLEDRONIC ACID 4 MG/100ML IV SOLN
4.0000 mg | Freq: Once | INTRAVENOUS | Status: AC
  Administered 2022-11-10: 4 mg via INTRAVENOUS
  Filled 2022-11-10: qty 100

## 2022-11-10 NOTE — Telephone Encounter (Signed)
This was sent to pre op in error. I will forward to Dr. Charlott Rakes nurse ANN about the labs being requested.

## 2022-11-10 NOTE — Progress Notes (Unsigned)
[employer] long term holter monitor serial # [id number] applied to patient using bridge and softee hypoallergenic electrodes. Patient given Hydrocolloid strips to try during is Aqua therapy. Patient instructed to remove bridge and softee electrodes prior to water activity, (shower or pool).

## 2022-11-10 NOTE — Telephone Encounter (Signed)
MyChart sent to the pt.

## 2022-11-11 LAB — PRO B NATRIURETIC PEPTIDE: NT-Pro BNP: 760 pg/mL — ABNORMAL HIGH (ref 0–376)

## 2022-11-11 LAB — TSH: TSH: 0.88 u[IU]/mL (ref 0.450–4.500)

## 2022-11-12 ENCOUNTER — Encounter: Payer: Self-pay | Admitting: Internal Medicine

## 2022-11-16 NOTE — Therapy (Unsigned)
OUTPATIENT PHYSICAL THERAPY ONCOLOGY TREATMENT  Patient Name: Travis [name] MRN: [id number] DOB:Jan 05, 1951, 72 y.o., male Today's Date: 11/17/2022   END OF SESSION:  PT End of Session - 11/17/22 1242     Visit Number 13    Number of Visits 23    Date for PT Re-Evaluation 12/20/22    PT Start Time 1100    PT Stop Time 1145    PT Time Calculation (min) 45 min    Activity Tolerance Patient tolerated treatment well    Behavior During Therapy WFL for tasks assessed/performed               Past Medical History:  Diagnosis Date   A-fib (HCC)    Arthritis    "comes and goes; mostly in hands, occasionally elbow" (04/05/2017)   Complication of anesthesia    "just wanted to sleep alot  for couple days S/P VARICOCELE OR" (04/05/2017)   DVT (deep venous thrombosis) (HCC)    LLE   Dysrhythmia    PVCs    GERD (gastroesophageal reflux disease)    History of hiatal hernia    History of radiation therapy    Thoracic Spine- 07/13/22-07/23/22-Dr. Antony Blackbird   Hypertension    Impaired glucose tolerance    Multiple myeloma (HCC)    Obesity (BMI 30-39.9) 07/26/2016   OSA on CPAP 07/26/2016   Mild with AHI 12.5/hr now on CPAP at 14cm H2O   PAF (paroxysmal atrial fibrillation) (HCC)    s/p PVI Ablation in 2018 // Flecainide Rx // Echo 8/21: EF 60-65, no RWMA, mild LVH, normal RV SF, moderate RVE, mild BAE, trivial MR, mild dilation of aortic root (40 mm)   Pneumonia    "may have had walking pneumonia a few years ago" (04/05/2017)   Pre-diabetes    Thrombophlebitis    Past Surgical History:  Procedure Laterality Date   ATRIAL FIBRILLATION ABLATION  04/05/2017   ATRIAL FIBRILLATION ABLATION N/A 04/05/2017   Procedure: ATRIAL FIBRILLATION ABLATION;  Surgeon: [name], MD;  Location: [facility];  Service: Cardiovascular;  Laterality: N/A;   BONE BIOPSY Right 12/17/2021   Procedure: BONE BIOPSY;  Surgeon: [name], MD;  Location: [facility];  Service:  Orthopedics;  Laterality: Right;   COMPLETE RIGHT HIP REPLACMENT  01/19/2019   EVALUATION UNDER ANESTHESIA WITH HEMORRHOIDECTOMY N/A 02/24/2017   Procedure: EXAM UNDER ANESTHESIA WITH  HEMORRHOIDECTOMY;  Surgeon: [name], MD;  Location: [facility];  Service: General;  Laterality: N/A;   EXCISIONAL TOTAL HIP ARTHROPLASTY WITH ANTIBIOTIC SPACERS Right 09/25/2020   Procedure: EXCISIONAL TOTAL HIP ARTHROPLASTY WITH ANTIBIOTIC SPACERS;  Surgeon: [name], MD;  Location: [facility];  Service: Orthopedics;  Laterality: Right;   FLEXIBLE SIGMOIDOSCOPY N/A 04/15/2015   Procedure:  UNSEDATED FLEXIBLE SIGMOIDOSCOPY;  Surgeon: [name], MD;  Location: [facility];  Service: Endoscopy;  Laterality: N/A;   HUMERUS IM NAIL Right 12/17/2021   Procedure: INTRAMEDULLARY (IM) NAIL HUMERAL;  Surgeon: [name], MD;  Location: [facility];  Service: Orthopedics;  Laterality: Right;   KNEE ARTHROSCOPY Left ~ 2016   POSTERIOR CERVICAL FUSION/FORAMINOTOMY N/A 06/04/2022   Procedure: Thoracic one - Thoracic Four Posterior lateral arthrodesis/ fusion and Thoracic two Corpectomy;  Surgeon: [name], MD;  Location: [facility];  Service: Neurosurgery;  Laterality: N/A;   REIMPLANTATION OF TOTAL HIP Right 12/25/2020   Procedure: REIMPLANTATION/REVISION OF RIGHT TOTAL HIP VERSUS REPEAT IRRIGATION AND DEBRIDEMENT;  Surgeon: [name], MD;  Location: [facility];  Service: Orthopedics;  Laterality: Right;   TESTICLE SURGERY  1988   VARICOCELE   TONSILLECTOMY     VARICOSE VEIN SURGERY Bilateral    Patient Active Problem List   Diagnosis Date Noted   Pancreatic cyst 09/30/2022   Infection due to novel influenza A virus 09/29/2022   Persistent atrial fibrillation (HCC) 07/15/2022   Acute on chronic diastolic heart failure (HCC) 07/03/2022   Pulmonary infiltrates 07/01/2022   Acute respiratory failure with hypoxia (HCC) 07/01/2022   Hyponatremia 06/29/2022   History of thoracic surgery 06/29/2022    Acute DVT of left tibial vein (HCC) 06/21/2022   Hypokalemia 06/21/2022   Vitamin D deficiency 05/30/2022   Other constipation 05/29/2022   Pathologic compression fracture of spine, initial encounter (HCC) 05/29/2022   Cord compression (HCC) 05/28/2022   Degenerative cervical disc 05/14/2022   Hypercoagulable state due to persistent atrial fibrillation (HCC) 01/11/2022   Multiple myeloma (HCC) 12/10/2021   Medication monitoring encounter 02/27/2021   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   Status post peripherally inserted central catheter (PICC) central line placement 11/06/2020   Drug rash with eosinophilia and systemic symptoms syndrome 11/06/2020   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   PAF (paroxysmal atrial fibrillation) (HCC)    GERD (gastroesophageal reflux disease) 03/29/2018   Hiatal hernia 03/29/2018   History of DVT (deep vein thrombosis) 03/29/2018   CAP (community acquired pneumonia) 03/29/2018   Osteoarthritis 03/29/2018   Neuropathy 08/29/2017   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   Paroxysmal atrial fibrillation with rapid ventricular response (HCC) 04/05/2017   Prolapsed internal hemorrhoids, grade 4, s/p ligation/pexy/hemorrhoidectomy x 2 02/24/2017 02/24/2017   OSA (obstructive sleep apnea) 07/26/2016   Obesity (BMI 30-39.9) 07/26/2016   Atrial fibrillation with RVR (HCC)    Essential hypertension    Chest pain at rest     PCP: Eleanora Neighbor, MD  REFERRING PROVIDER: Antony Blackbird, MD  REFERRING DIAG: Multiple Myeloma  THERAPY DIAG:  Compression fracture of thoracic vertebra, unspecified thoracic vertebral level, initial encounter (HCC)  Multiple myeloma not having achieved remission (HCC)  Muscle weakness (generalized)  Abnormal posture  Neck pain  Other lack of coordination  Chronic right shoulder pain  ONSET DATE: 2023  Rationale for Evaluation and Treatment: Rehabilitation  SUBJECTIVE:                                                                                                                                                                                            SUBJECTIVE STATEMENT: I had to d to cancel my trip to [place]  due to my Afib. I had been  in normal sinus rhythm since Monday. I see my neurosurgeon coming up for follow up. My neck pain is now very managable and I have better ROM in my shoulders.   PERTINENT HISTORY:  ASHTIAN VILLACIS 72 y.o. male with medical history significant for IgA Kappa Multiple Myeloma. He was diagnosed with smouldering myeloma in 2018 . Last July he developed right arm pain due to a pathologic fx and he underwent radiation and fixation. In early Feb 2024 he had radiation for pathologic T2 compression fx followed by a T1-T4 fusion. PAIN:  Are you having pain? Are you having pain? yes NPRS scale: 2/10 Pain location: shoulder blades and neck, bilateral thoracic paraspinals, Right upper arm Pain orientation: Bilateral  PAIN TYPE: aching and tight Pain description: constant  Aggravating factors: getting up and down, looking down Relieving factors: sleeping, laying down.   PRECAUTIONS: Multiple Myeloma with mets to R ribs, R humerus with ORIF for pathological fracture 12/17/21. Lesions have also been found in R rib and also in his skull. Pathologic fx T2 with fusion T1-4 on 06/04/22, Bilateral LE edema (on Lasix), Afib   WEIGHT BEARING RESTRICTIONS: No  FALLS:  Has patient fallen in last 6 months? Yes. Number of falls 1  LIVING ENVIRONMENT: Lives with: lives with their family and lives with their spouse Lives in: House/apartment Stairs: Yes; Internal: 14 steps; on left going up and External: 3 steps; on right going up Has following equipment at home: Single point cane and Walker - 4 wheeled  OCCUPATION: attorney/ mostly retired?  LEISURE: golf (can't do now.), use to play racquetball  HAND DOMINANCE: right   PRIOR LEVEL OF FUNCTION:  Independent  POSTURE:significant slump sitting posture with forward head  PATIENT GOALS: decrease pain,get stronger,improve balance   OBJECTIVE:  COGNITION: Overall cognitive status: Within functional limits for tasks assessed   PALPATION: Very tender bilateral UT/levator, scapular regions  OBSERVATIONS / OTHER ASSESSMENTS: very flexed head and neck with chin nearly at chest  SENSATION: Light touch: Appears intact    POSTURE: significant forward head, round shoulders, increased kyphosis   Gait holding single point cane in left hand but not using. Right shoulder low, flexed neck;difficulty raising head to see where he is going  UPPER EXTREMITY AROM/PROM:  A/PROM RIGHT   eval  RIGHT 11/08/2022  Shoulder extension    Shoulder flexion 91 with compensation 101 with compensation   Shoulder abduction 67 with compensation 75 with compensation  Shoulder internal rotation    Shoulder external rotation C6     (Blank rows = not tested)  A/PROM LEFT   eval  Shoulder extension   Shoulder flexion 123  Shoulder abduction 112  Shoulder internal rotation T7  Shoulder external rotation t3    (Blank rows = not tested)  CERVICAL AROM: All within normal limits:    Percent limited 11/08/22  Flexion Fully flexed   Extension From flexed position decreased 80% Decreased 70%  Right lateral flexion    Left lateral flexion    Right rotation Dec 50% sitting in FH posture   Left rotation        Dec 50%     UPPER EXTREMITY STRENGTH:    LOWER EXTREMITY AROM/PROM:  A/PROM Right eval  Hip flexion   Hip extension   Hip abduction   Hip adduction   Hip internal rotation   Hip external rotation   Knee flexion   Knee extension   Ankle dorsiflexion   Ankle plantarflexion   Ankle inversion  Ankle eversion    (Blank rows = not tested)  A/PROM LEFT eval  Hip flexion   Hip extension   Hip abduction   Hip adduction   Hip internal rotation   Hip external rotation   Knee  flexion   Knee extension   Ankle dorsiflexion   Ankle plantarflexion   Ankle inversion   Ankle eversion    (Blank rows = not tested)  LOWER EXTREMITY MMT:    10/04/2022  4 position balance test; able to hold DLS feet together , and half tandem stance 10 seconds. Unable to maintain full tandem stance for 10 sec, unable to hold SLS greater than a second or 2 on left, and 4 seconds on right.     LYMPHEDEMA ASSESSMENTS:   SURGERY TYPE/DATE: 8/17/23Left prox humerus fixation for pathologic fx  2/2/2024Fusion T1-T4 for pathologic fx with cord compression 06/04/22 posterior cervical fusion/foraminotomy;T1-4 fusion NUMBER OF LYMPH NODES REMOVED: NA  CHEMOTHERAPY: Velcade/promalyst/Dex;  RADIATION:to prox humerus and thoracic spine pathologic fx  HORMONE TREATMENT: NA  INFECTIONS: NA  FUNCTIONAL TESTS:  10/09/2022;30 second sit to stand 10 with heavy use of arms  GAIT: Distance walked: 20 Assistive device utilized: carried SPC in left Level of assistance: Complete Independence Comments: feels unsteady, very flexed neck posture   QUICK DASH SURVEY:    TODAY'S TREATMENT:                                                                                                                                         DATE:   11/17/22:Pt seen for aquatic therapy today.  Treatment took place in water 3.5-4.75 ft in depth at the [facility] pool. Temp of water was 91.  Pt entered/exited the pool via stairs with bilat rail independently. Pt requires buoyancy of water for support and to offload joints with strengthening exercises.  Seated water bench with 75% submersion Pt performed seated LE AROM exercises 20x in all planes, with concurrent discussion of his current health status and pain assessment. 75% depth water walking with single buoy UE wts for push/pull 8 lengths. 3 way stance: parallel, tandem both sides with hand paddles: 10x bil arms forward & backwards. 10 min horizontal float with  concurrent skin rolling on upper back.   11/09/22 Pt seen for aquatic therapy today.  Treatment took place in water 3.5-4.75 ft in depth at the [facility] pool. Temp of water was 91.  Pt entered/exited the pool via stairs with bilat rail independently Upon arrival:  HR 58-62, spO2 96%. *With UE on yellow hand floats:  walking forward and backward with row motion with arms * side stepping with UE support on rainbow hand floats, then side stepping with arm addct/abdct -> moved to yellow hand floats * staggered stance with bilat shoulder horiz abdct/ addct x 5 each LE forward * tricep push down with yellow hand floats x 10  * UE  support on yellow hand floats: SLS with 3 way toe touch x 5 each side * return to walking forward/ backward with row motion of arms * UE support on yellow hand floasts: tandem stance x 20sec each LE forward; tandem gait forward/ backward  * staggered stance with bilat shoulder horiz abdct/ addct x 5 each LE forward * seated at bench - cycling, alternating LAQ with DF, and long sitting hip abdct/ addct;  STS with forward arm reach x 10 * at stairs: supported back float with noodle behind back (under arms), under knees, and nekdoodle;  CGA to transition up to sitting on stairs from back float.     11/08/2022 O2 97, HR 55 and regularly Nustep UE 7, LE 12, lev 5 x 7 min Assessed shoulder and cervical ROM, tested tandem stance balance and 30 second sit to stand 30 sec Sit to stand 10 using arms, O2 97, HR 61 Tandem stance x 3 B, able to  maintain Right foot forward x 10 sec but not left forward Sidestepping in bars x 3 laps Partial step ups x 15 B SLS x 5 ea to failure Mini squats x 10 o2 94, HR 65 Showed how to perform mini squats to high bed for better form STM to bil UT/upper scap region and cervical muscles and bilateral paraspinal muscles in prop sitting with cocoa butter 11/01/22 NuStep Level 5 seat 12, UE 7 x 4 mins; HR 70-80, allowed to rest;repeated x 4  min HR 70-100 intermittently., 687, RPE 3 Pt in gait belt Heel raises x 15 light HH Marching x 10 B intermittent light HH Sidestepping in bars x 4 laps RPE 1, HR 81-111 Step ups on ax forward and sideways x 10 with CGA but no LOB Instructed and demonstrated to pt supine to and from sit on mat table Bridging 2 x 5  Supine cervical retraction into pillow with PT hands guiding x 10 STM to bil UT/upper scap region and cervical muscles and bilateral paraspinal muscles in prop sitting with cocoa butter    PATIENT EDUCATION:  Education details: sitting posture with lumbar roll Person educated: Patient Education method: Medical illustrator Education comprehension: verbalized understanding and returned demonstration  HOME EXERCISE PROGRAM: Doing HEP from HHPT; cervical ROM, shoulder rolls, shrugs, scap retraction, UT stretches  Aquatic Access Code: [email or web address] URL: [email or web address] Date: 11/09/2022 Prepared by: [facility] [address] This aquatic home exercise program from Med[employer] utilizes pictures from land based exercises, but has been adapted prior to lamination and issuance.    ASSESSMENT:  CLINICAL IMPRESSION: Pt reports his neck back pain is now considered manageable. Pt appears steadier walking in the pool today, no significant wobbles or LOB. Pt was planning on using a friends pool to exercise but this did not work out. Pt reports he has been in normal sinus rhythm since Monday.      OBJECTIVE IMPAIRMENTS: decreased activity tolerance, decreased balance, difficulty walking, decreased ROM, decreased strength, increased edema, impaired UE functional use, postural dysfunction, and pain.   ACTIVITY LIMITATIONS: carrying, lifting, dressing, reach over head, and locomotion level  PARTICIPATION LIMITATIONS: cleaning, laundry, driving, and occupation  PERSONAL FACTORS:  Multiple Myeloma  are also affecting patient's functional outcome.    REHAB POTENTIAL: Good  CLINICAL DECISION MAKING: Evolving/moderate complexity  EVALUATION COMPLEXITY: Moderate  GOALS: Goals reviewed with patient? Yes  SHORT TERM GOALS: Target date: 10/21/22  Pt will be educated in and independent in proper sitting posture using lumbar roll prn  Baseline: Goal status; MET 11/08/22 2.  Pt will report decreased muscle soreness in bilateral upper back x 25% Baseline:  Goal status: ONGOING  3.  Pt will be compliant with a HEP for postural strengthening/ROM Baseline:  Goal status: MET / LONG TERM GOALS: Target date: 12/20/22  Pt will be able to bring his head to neutral position for safety with ambulation/driving Baseline:  Goal status:Ongoing 2.  Pt will have decreased upper back pain by 50% or more Baseline:  Goal status:Ongoing 3.  Pt will be able to flex right shoulder to 115 degrees with minimal compensation for improved reaching ability Baseline:  Goal status: ONGOING 4; Pt will be able to maintain tandem stance on both sides x 10 sec to demonstrated improved balance. Goal status:MET right foot forward 11/08/2022, Ongoing left foot forward  5. Pt will be able to perform sit to stand x 5 without use of Ue's  Baseline:must use hands  Goal status:New  PT FREQUENCY: 2x/week  PT DURATION: 6 weeks  PLANNED INTERVENTIONS: Therapeutic exercises, Therapeutic activity, Neuromuscular re-education, Balance training, Gait training, Patient/Family education, Self Care, Joint mobilization, Aquatic Therapy, Manual therapy, and Re-evaluation  PLAN FOR NEXT SESSION: Cont land and aquatic exercises working on posture, strength, balance, STM prn for pain  Ane Payment, PTA 11/17/22 9:37 PM   [facility] [address] [place], [place], [address] Phone: [phone]   Fax:  [phone]

## 2022-11-16 NOTE — Telephone Encounter (Signed)
Pt called requesting CD of MRI report from June to be ready for him to take to Dr Franky Macho.  Notified Vanda/Radiology & she will have in admitting for him to pick up.

## 2022-11-17 ENCOUNTER — Ambulatory Visit: Payer: Medicare Other | Admitting: Physical Therapy

## 2022-11-17 ENCOUNTER — Other Ambulatory Visit: Payer: Medicare Other

## 2022-11-17 ENCOUNTER — Ambulatory Visit: Payer: Medicare Other | Admitting: Physician Assistant

## 2022-11-17 ENCOUNTER — Inpatient Hospital Stay: Payer: Medicare Other

## 2022-11-17 ENCOUNTER — Encounter: Payer: Self-pay | Admitting: Physical Therapy

## 2022-11-17 ENCOUNTER — Other Ambulatory Visit: Payer: Self-pay | Admitting: Hematology and Oncology

## 2022-11-17 ENCOUNTER — Ambulatory Visit: Payer: Medicare Other

## 2022-11-17 VITALS — BP 119/58 | HR 55 | Temp 98.1°F | Resp 18 | Wt 255.8 lb

## 2022-11-17 DIAGNOSIS — M48061 Spinal stenosis, lumbar region without neurogenic claudication: Secondary | ICD-10-CM | POA: Diagnosis not present

## 2022-11-17 DIAGNOSIS — M2578 Osteophyte, vertebrae: Secondary | ICD-10-CM | POA: Diagnosis not present

## 2022-11-17 DIAGNOSIS — M5117 Intervertebral disc disorders with radiculopathy, lumbosacral region: Secondary | ICD-10-CM | POA: Diagnosis not present

## 2022-11-17 DIAGNOSIS — K5730 Diverticulosis of large intestine without perforation or abscess without bleeding: Secondary | ICD-10-CM | POA: Diagnosis not present

## 2022-11-17 DIAGNOSIS — I10 Essential (primary) hypertension: Secondary | ICD-10-CM | POA: Diagnosis not present

## 2022-11-17 DIAGNOSIS — I700 Atherosclerosis of aorta: Secondary | ICD-10-CM | POA: Diagnosis not present

## 2022-11-17 DIAGNOSIS — M50321 Other cervical disc degeneration at C4-C5 level: Secondary | ICD-10-CM | POA: Diagnosis not present

## 2022-11-17 DIAGNOSIS — S22000A Wedge compression fracture of unspecified thoracic vertebra, initial encounter for closed fracture: Secondary | ICD-10-CM

## 2022-11-17 DIAGNOSIS — C9000 Multiple myeloma not having achieved remission: Secondary | ICD-10-CM | POA: Diagnosis not present

## 2022-11-17 DIAGNOSIS — G8929 Other chronic pain: Secondary | ICD-10-CM

## 2022-11-17 DIAGNOSIS — M47812 Spondylosis without myelopathy or radiculopathy, cervical region: Secondary | ICD-10-CM | POA: Diagnosis not present

## 2022-11-17 DIAGNOSIS — R001 Bradycardia, unspecified: Secondary | ICD-10-CM | POA: Diagnosis not present

## 2022-11-17 DIAGNOSIS — I480 Paroxysmal atrial fibrillation: Secondary | ICD-10-CM | POA: Diagnosis not present

## 2022-11-17 DIAGNOSIS — K7689 Other specified diseases of liver: Secondary | ICD-10-CM | POA: Diagnosis not present

## 2022-11-17 DIAGNOSIS — R293 Abnormal posture: Secondary | ICD-10-CM | POA: Diagnosis not present

## 2022-11-17 DIAGNOSIS — K862 Cyst of pancreas: Secondary | ICD-10-CM | POA: Diagnosis not present

## 2022-11-17 DIAGNOSIS — M542 Cervicalgia: Secondary | ICD-10-CM

## 2022-11-17 DIAGNOSIS — Z5112 Encounter for antineoplastic immunotherapy: Secondary | ICD-10-CM | POA: Diagnosis not present

## 2022-11-17 DIAGNOSIS — S22000D Wedge compression fracture of unspecified thoracic vertebra, subsequent encounter for fracture with routine healing: Secondary | ICD-10-CM | POA: Diagnosis not present

## 2022-11-17 DIAGNOSIS — M47816 Spondylosis without myelopathy or radiculopathy, lumbar region: Secondary | ICD-10-CM | POA: Diagnosis not present

## 2022-11-17 DIAGNOSIS — M6281 Muscle weakness (generalized): Secondary | ICD-10-CM | POA: Diagnosis not present

## 2022-11-17 DIAGNOSIS — E876 Hypokalemia: Secondary | ICD-10-CM | POA: Diagnosis not present

## 2022-11-17 DIAGNOSIS — R278 Other lack of coordination: Secondary | ICD-10-CM

## 2022-11-17 DIAGNOSIS — G4733 Obstructive sleep apnea (adult) (pediatric): Secondary | ICD-10-CM | POA: Diagnosis not present

## 2022-11-17 DIAGNOSIS — K4020 Bilateral inguinal hernia, without obstruction or gangrene, not specified as recurrent: Secondary | ICD-10-CM | POA: Diagnosis not present

## 2022-11-17 DIAGNOSIS — J90 Pleural effusion, not elsewhere classified: Secondary | ICD-10-CM | POA: Diagnosis not present

## 2022-11-17 DIAGNOSIS — R600 Localized edema: Secondary | ICD-10-CM | POA: Diagnosis not present

## 2022-11-17 DIAGNOSIS — K5900 Constipation, unspecified: Secondary | ICD-10-CM | POA: Diagnosis not present

## 2022-11-17 DIAGNOSIS — K429 Umbilical hernia without obstruction or gangrene: Secondary | ICD-10-CM | POA: Diagnosis not present

## 2022-11-17 DIAGNOSIS — Z515 Encounter for palliative care: Secondary | ICD-10-CM | POA: Diagnosis not present

## 2022-11-17 LAB — CMP (CANCER CENTER ONLY)
ALT: 31 U/L (ref 0–44)
AST: 27 U/L (ref 15–41)
Albumin: 3.8 g/dL (ref 3.5–5.0)
Alkaline Phosphatase: 97 U/L (ref 38–126)
Anion gap: 7 (ref 5–15)
BUN: 27 mg/dL — ABNORMAL HIGH (ref 8–23)
CO2: 33 mmol/L — ABNORMAL HIGH (ref 22–32)
Calcium: 8.8 mg/dL — ABNORMAL LOW (ref 8.9–10.3)
Chloride: 102 mmol/L (ref 98–111)
Creatinine: 1.09 mg/dL (ref 0.61–1.24)
GFR, Estimated: >60 mL/min (ref >60)
Glucose, Bld: 112 mg/dL — ABNORMAL HIGH (ref 70–99)
Potassium: 3.1 mmol/L — ABNORMAL LOW (ref 3.5–5.1)
Sodium: 142 mmol/L (ref 135–145)
Total Bilirubin: 0.4 mg/dL (ref 0.3–1.2)
Total Protein: 5.9 g/dL — ABNORMAL LOW (ref 6.5–8.1)

## 2022-11-17 LAB — CBC WITH DIFFERENTIAL (CANCER CENTER ONLY)
Abs Immature Granulocytes: 0.05 10*3/uL (ref 0.00–0.07)
Basophils Absolute: 0 10*3/uL (ref 0.0–0.1)
Basophils Relative: 0 %
Eosinophils Absolute: 0.2 10*3/uL (ref 0.0–0.5)
Eosinophils Relative: 2 %
HCT: 31.8 % — ABNORMAL LOW (ref 39.0–52.0)
Hemoglobin: 10.4 g/dL — ABNORMAL LOW (ref 13.0–17.0)
Immature Granulocytes: 1 %
Lymphocytes Relative: 8 %
Lymphs Abs: 0.6 10*3/uL — ABNORMAL LOW (ref 0.7–4.0)
MCH: 31 pg (ref 26.0–34.0)
MCHC: 32.7 g/dL (ref 30.0–36.0)
MCV: 94.9 fL (ref 80.0–100.0)
Monocytes Absolute: 0.8 10*3/uL (ref 0.1–1.0)
Monocytes Relative: 10 %
Neutro Abs: 6.2 10*3/uL (ref 1.7–7.7)
Neutrophils Relative %: 79 %
Platelet Count: 161 10*3/uL (ref 150–400)
RBC: 3.35 MIL/uL — ABNORMAL LOW (ref 4.22–5.81)
RDW: 17.1 % — ABNORMAL HIGH (ref 11.5–15.5)
WBC Count: 7.8 10*3/uL (ref 4.0–10.5)
nRBC: 0 % (ref 0.0–0.2)

## 2022-11-17 LAB — LACTATE DEHYDROGENASE: LDH: 168 U/L (ref 98–192)

## 2022-11-17 MED ORDER — BORTEZOMIB CHEMO SQ INJECTION 3.5 MG (2.5MG/ML)
1.3000 mg/m2 | Freq: Once | INTRAMUSCULAR | Status: AC
  Administered 2022-11-17: 3 mg via SUBCUTANEOUS
  Filled 2022-11-17: qty 1.2

## 2022-11-17 NOTE — Patient Instructions (Signed)
 [facility]  Discharge Instructions: Thank you for choosing [facility] to provide your oncology and hematology care.   If you have a lab appointment with the Cancer Center, please go directly to the Cancer Center and check in at the registration area.   Wear comfortable clothing and clothing appropriate for easy access to any Portacath or PICC line.   We strive to give you quality time with your provider. You may need to reschedule your appointment if you arrive late (15 or more minutes).  Arriving late affects you and other patients whose appointments are after yours.  Also, if you miss three or more appointments without notifying the office, you may be dismissed from the clinic at the provider's discretion.      For prescription refill requests, have your pharmacy contact our office and allow 72 hours for refills to be completed.    Today you received the following chemotherapy and/or immunotherapy agents: Velcade        To help prevent nausea and vomiting after your treatment, we encourage you to take your nausea medication as directed.  BELOW ARE SYMPTOMS THAT SHOULD BE REPORTED IMMEDIATELY: *FEVER GREATER THAN 100.4 F (38 C) OR HIGHER *CHILLS OR SWEATING *NAUSEA AND VOMITING THAT IS NOT CONTROLLED WITH YOUR NAUSEA MEDICATION *UNUSUAL SHORTNESS OF BREATH *UNUSUAL BRUISING OR BLEEDING *URINARY PROBLEMS (pain or burning when urinating, or frequent urination) *BOWEL PROBLEMS (unusual diarrhea, constipation, pain near the anus) TENDERNESS IN MOUTH AND THROAT WITH OR WITHOUT PRESENCE OF ULCERS (sore throat, sores in mouth, or a toothache) UNUSUAL RASH, SWELLING OR PAIN  UNUSUAL VAGINAL DISCHARGE OR ITCHING   Items with * indicate a potential emergency and should be followed up as soon as possible or go to the Emergency Department if any problems should occur.  Please show the CHEMOTHERAPY ALERT CARD or IMMUNOTHERAPY ALERT CARD at  check-in to the Emergency Department and triage nurse.  Should you have questions after your visit or need to cancel or reschedule your appointment, please contact [facility]  Dept: [phone]  and follow the prompts.  Office hours are 8:00 a.m. to 4:30 p.m. Monday - Friday. Please note that voicemails left after 4:00 p.m. may not be returned until the following business day.  We are closed weekends and major holidays. You have access to a nurse at all times for urgent questions. Please call the main number to the clinic Dept: [phone] and follow the prompts.   For any non-urgent questions, you may also contact your provider using MyChart. We now offer e-Visits for anyone 18 and older to request care online for non-urgent symptoms. For details visit [email or web address].   Also download the MyChart app! Go to the app store, search "MyChart", open the app, select [facility], and log in with your MyChart username and password.  

## 2022-11-18 ENCOUNTER — Encounter: Payer: Self-pay | Admitting: Pharmacist

## 2022-11-18 ENCOUNTER — Ambulatory Visit: Payer: Medicare Other | Attending: Internal Medicine | Admitting: Pharmacist

## 2022-11-18 DIAGNOSIS — I5033 Acute on chronic diastolic (congestive) heart failure: Secondary | ICD-10-CM | POA: Diagnosis not present

## 2022-11-18 DIAGNOSIS — I10 Essential (primary) hypertension: Secondary | ICD-10-CM | POA: Diagnosis not present

## 2022-11-18 DIAGNOSIS — C9000 Multiple myeloma not having achieved remission: Secondary | ICD-10-CM | POA: Diagnosis not present

## 2022-11-18 DIAGNOSIS — R29898 Other symptoms and signs involving the musculoskeletal system: Secondary | ICD-10-CM | POA: Diagnosis not present

## 2022-11-18 MED ORDER — TORSEMIDE 20 MG PO TABS: ORAL_TABLET | ORAL | 3 refills | Status: DC

## 2022-11-18 NOTE — Progress Notes (Unsigned)
Patient ID: [name]                 DOB: 1951-02-11                      MRN: [id number]      HPI: Travis [name] is a 72 y.o. male patient of Dr. [name] referred by Travis Searing NP  to HTN clinic. PMH is significant for PAF (on Eliquis) status post PVI ablation 2018, hypertension, OSA, prior DVT, monoclonal gammopathy (smoldering myeloma), peripheral neuropathy.  Patient seen in clinic the middle of June. Patient reported experiencing elevated blood pressures with systolic readings in the 160s to 170s. Some shortness of breath and slightly volume up during that exam. Lisinopril was stopped and he was started on irbesartan 150mg  daily. His hydralazine had been recently increased to 75mg  TID prior to that.  He was seen by Travis Favre PA on 7/5. He was bradycardic after an episode of afib. Also fluid overloaded. His metoprolol was held, zio patch ordered. His lasix was increased to 60mg  daily for 3 days then back to 40mg  daily.  Blood pressure was 110/68 in the office that day.   K 3.1 scr 1.09 on labs yesterday.  Patient presents today to clinic accompanied by his wife. Patient is fluid overloaded today. Pitting edema in both ankles. Weight is up 5lb from yesterday. Received chemo treatment yesterday. Blood pressure has been fairly good. Mostly at goal.  Patient has taking KCL 80 MEQ of KCL for the last 2 days.  7/12 248 lb 7/13 247 lb 7/14 243 lb 7/15 248 lb 7/16 247 lb 7/17 249 lb 7/18 254 lb  Took metolazone and 60 mg of furosemide this AM60   Current HTN meds: irbesartan 150mg  daily, hydralazine 100 AM, 50 afternoon and 100 mg at night, furosemide 40mg  daily, metoprolol succinate 50mg  as needed for HR >100 Previously tried: amlodipine, HCTZ BP goal: <130/80  Exercise:  Some walking  Home BP readings:  112/63, 111/56, 112/57, 131/55, 124/53, 145/78, 135/55,143/78, 103/64, 126/78, 150/82, 127/59, 142/71  Wt Readings from Last 3 Encounters:  11/17/22 255 lb 12.8 oz (116  kg)  11/10/22 251 lb 8 oz (114.1 kg)  11/05/22 250 lb (113.4 kg)   BP Readings from Last 3 Encounters:  11/17/22 (!) 119/58  11/10/22 (!) 145/77  11/05/22 110/68   Pulse Readings from Last 3 Encounters:  11/17/22 (!) 55  11/10/22 (!) 53  11/05/22 (!) 45    Renal function: Estimated Creatinine Clearance: 79.3 mL/min (by C-G formula based on SCr of 1.09 mg/dL).  Past Medical History:  Diagnosis Date   A-fib [facility])    Arthritis    "comes and goes; mostly in hands, occasionally elbow" (04/05/2017)   Complication of anesthesia    "just wanted to sleep alot  for couple days S/P VARICOCELE OR" (04/05/2017)   DVT (deep venous thrombosis) (HCC)    LLE   Dysrhythmia    PVCs    GERD (gastroesophageal reflux disease)    History of hiatal hernia    History of radiation therapy    Thoracic Spine- 07/13/22-07/23/22-Dr. Antony Blackbird   Hypertension    Impaired glucose tolerance    Multiple myeloma (HCC)    Obesity (BMI 30-39.9) 07/26/2016   OSA on CPAP 07/26/2016   Mild with AHI 12.5/hr now on CPAP at 14cm H2O   PAF (paroxysmal atrial fibrillation) (HCC)    s/p PVI Ablation in 2018 // Flecainide Rx // Echo  8/21: EF 60-65, no RWMA, mild LVH, normal RV SF, moderate RVE, mild BAE, trivial MR, mild dilation of aortic root (40 mm)   Pneumonia    "may have had walking pneumonia a few years ago" (04/05/2017)   Pre-diabetes    Thrombophlebitis     Current Outpatient Medications on File Prior to Visit  Medication Sig Dispense Refill   acyclovir (ZOVIRAX) 400 MG tablet Take 1 tablet (400 mg total) by mouth 2 (two) times daily. 180 tablet 3   albuterol (VENTOLIN HFA) 108 (90 Base) MCG/ACT inhaler Inhale 2 puffs into the lungs every 6 (six) hours as needed for wheezing or shortness of breath. 8 g 0   amiodarone (PACERONE) 200 MG tablet Take 1 tablet (200 mg total) by mouth daily. Start after finishing 40 mg twice daily dose of  days (Patient taking differently: Take 200 mg by mouth daily.) 180  tablet 1   apixaban (ELIQUIS) 5 MG TABS tablet Take 1 tablet (5 mg total) by mouth 2 (two) times daily. 60 tablet 11   cholecalciferol (VITAMIN D3) 25 MCG (1000 UNIT) tablet Take 1,000 Units by mouth daily.     Cyanocobalamin (VITAMIN B-12 PO) Take 1,000 mcg by mouth daily.     dexamethasone (DECADRON) 4 MG tablet Take 10 tablets (40 mg total) by mouth every 7 (seven) days. Take 40 mg by mouth once a day in the morning of chemo on Wednesdays- or unless otherwise instructed 40 tablet 2   fluticasone (FLONASE) 50 MCG/ACT nasal spray Place 1 spray into both nostrils daily as needed for allergies or rhinitis.     gabapentin (NEURONTIN) 300 MG capsule Take 2 capsules (600 mg total) by mouth 2 (two) times daily. 360 capsule 1   hydrALAZINE (APRESOLINE) 50 MG tablet Take 1.5 tablets (75 mg total) by mouth 3 (three) times daily. 135 tablet 3   irbesartan (AVAPRO) 150 MG tablet Take 1 tablet (150 mg total) by mouth daily. 30 tablet 5   metolazone (ZAROXOLYN) 2.5 MG tablet Take 1 tablet (2.5 mg total) by mouth every other day. 45 tablet 3   metoprolol succinate (TOPROL-XL) 50 MG 24 hr tablet Take 1/2 to 1 as needed up to twice a day for AFIB and heart rate above 100. 90 tablet 3   Omega-3 Fatty Acids (FISH OIL PO) Take 1 Capful by mouth daily.     pomalidomide (POMALYST) 2 MG capsule Take 1 capsule (2 mg total) by mouth daily. Take one tablet (2 mg by mouth) daily for 21 days, then take 7 days off [employer] auth #  [id number] Date obtained 10/29/22 21 capsule 0   potassium chloride SA (KLOR-CON M) 20 MEQ tablet Take 2 tablets (40 mEq total) by mouth daily. Take an extra tablet on the day you take the Metolazone (60 meq total) 225 tablet 3   sodium chloride (OCEAN) 0.65 % SOLN nasal spray Place 1 spray into both nostrils as needed for congestion.     No current facility-administered medications on file prior to visit.    Allergies  Allergen Reactions   Linezolid Other (See Comments)    Burning tongue     Vancomycin Rash    Other Reaction(s): rash, itching, fever, chills    There were no vitals taken for this visit.   Assessment/Plan:     1. Hypertension -  Essential hypertension Assessment: Blood pressure is mostly at goal at home Diastolic often in the low 50's Patient taking metoprolol only when HR is >100 HR has  recovered and is 56-82 He is taking hydralazine 100mg  in the AM, 50mg  in the afternoon and 100mg  in the PM  Plan:  Continue irbesartan 150mg  daily, hydralazine 100 AM, 50 afternoon and 100 mg at night and metoprolol succinate 50mg  if HR >100  Acute on chronic diastolic heart failure (HCC) Assessment: Patient up 5lb from yesterday Took 60mg  of furosemide this AM along with metolazone. Has not urinated all that much. Weight has increased over the last few days Pitting edema in both ankles Feels like his belly is bigger K low on labs yesterday  Plan: Will try switching to torsemide Take 1 tablet (20mg ) of torsemide today Tomorrow take 40mg  of torsemide x 2 days (unless he has a great response today) Then decrease to torsemide 20mg  daily Increase KCL to a total of 120 MEQ x 3 days. Patient aware to split into 3 times a day to avoid GI issues After 3 days, decrease to BID He will have labs done with cancer center 7/24 Patient to call if no response      Thank you  Olene Floss, Pharm.D, BCPS, CPP [facility] [address] [place], [place] [address]  Phone: [phone]; Fax: [phone]

## 2022-11-18 NOTE — Patient Instructions (Addendum)
Take 1 tablet of torsemide when you get home Tomorrow take 2 tablets of torsemide for the next 1-2 days Then take 1 tablet of torsemide daily. May take an extra tablet for a weight gain of 3 pounds overnight or 5 pounds in a week  Take 3 tablets of potassium twice a day for the next 3 days then decrease to 2 tablets twice a day.  Please call us if no improvement is seen Will review labs done at cancer center next week

## 2022-11-18 NOTE — Assessment & Plan Note (Signed)
Assessment: Blood pressure is mostly at goal at home Diastolic often in the low 50's Patient taking metoprolol only when HR is >100 HR has recovered and is 56-82 He is taking hydralazine 100mg  in the AM, 50mg  in the afternoon and 100mg  in the PM  Plan:  Continue irbesartan 150mg  daily, hydralazine 100 AM, 50 afternoon and 100 mg at night and metoprolol succinate 50mg  if HR >100

## 2022-11-18 NOTE — Assessment & Plan Note (Addendum)
Assessment: Patient up 5lb from yesterday Took 60mg  of furosemide this AM along with metolazone. Has not urinated all that much. Weight has increased over the last few days Pitting edema in both ankles Feels like his belly is bigger K low on labs yesterday  Plan: Will try switching to torsemide Take 1 tablet (20mg ) of torsemide today Tomorrow take 40mg  of torsemide x 2 days (unless he has a great response today) Then decrease to torsemide 20mg  daily Increase KCL to a total of 120 MEQ x 3 days. Patient aware to split into 3 times a day to avoid GI issues After 3 days, decrease to BID He will have labs done with cancer center 7/24 Patient to call if no response

## 2022-11-19 ENCOUNTER — Ambulatory Visit: Payer: Medicare Other

## 2022-11-19 DIAGNOSIS — R293 Abnormal posture: Secondary | ICD-10-CM

## 2022-11-19 DIAGNOSIS — M6281 Muscle weakness (generalized): Secondary | ICD-10-CM | POA: Diagnosis not present

## 2022-11-19 DIAGNOSIS — S22000A Wedge compression fracture of unspecified thoracic vertebra, initial encounter for closed fracture: Secondary | ICD-10-CM

## 2022-11-19 DIAGNOSIS — C9000 Multiple myeloma not having achieved remission: Secondary | ICD-10-CM

## 2022-11-19 DIAGNOSIS — S22000D Wedge compression fracture of unspecified thoracic vertebra, subsequent encounter for fracture with routine healing: Secondary | ICD-10-CM | POA: Diagnosis not present

## 2022-11-19 DIAGNOSIS — M542 Cervicalgia: Secondary | ICD-10-CM | POA: Diagnosis not present

## 2022-11-19 NOTE — Therapy (Addendum)
OUTPATIENT PHYSICAL THERAPY ONCOLOGY TREATMENT  Patient Name: Travis [name] MRN: [id number] DOB:1951/04/12, 72 y.o., male Today's Date: 11/19/2022   END OF SESSION:  PT End of Session - 11/19/22 1107     Visit Number 14    Number of Visits 23    Date for PT Re-Evaluation 12/20/22    PT Start Time 1108    PT Stop Time 1159    PT Time Calculation (min) 51 min    Equipment Utilized During Treatment Gait belt    Activity Tolerance Patient tolerated treatment well    Behavior During Therapy WFL for tasks assessed/performed               Past Medical History:  Diagnosis Date   A-fib (HCC)    Arthritis    "comes and goes; mostly in hands, occasionally elbow" (04/05/2017)   Complication of anesthesia    "just wanted to sleep alot  for couple days S/P VARICOCELE OR" (04/05/2017)   DVT (deep venous thrombosis) (HCC)    LLE   Dysrhythmia    PVCs    GERD (gastroesophageal reflux disease)    History of hiatal hernia    History of radiation therapy    Thoracic Spine- 07/13/22-07/23/22-Dr. Antony Blackbird   Hypertension    Impaired glucose tolerance    Multiple myeloma (HCC)    Obesity (BMI 30-39.9) 07/26/2016   OSA on CPAP 07/26/2016   Mild with AHI 12.5/hr now on CPAP at 14cm H2O   PAF (paroxysmal atrial fibrillation) (HCC)    s/p PVI Ablation in 2018 // Flecainide Rx // Echo 8/21: EF 60-65, no RWMA, mild LVH, normal RV SF, moderate RVE, mild BAE, trivial MR, mild dilation of aortic root (40 mm)   Pneumonia    "may have had walking pneumonia a few years ago" (04/05/2017)   Pre-diabetes    Thrombophlebitis    Past Surgical History:  Procedure Laterality Date   ATRIAL FIBRILLATION ABLATION  04/05/2017   ATRIAL FIBRILLATION ABLATION N/A 04/05/2017   Procedure: ATRIAL FIBRILLATION ABLATION;  Surgeon: [name], MD;  Location: [facility];  Service: Cardiovascular;  Laterality: N/A;   BONE BIOPSY Right 12/17/2021   Procedure: BONE BIOPSY;  Surgeon: [name],  MD;  Location: [facility];  Service: Orthopedics;  Laterality: Right;   COMPLETE RIGHT HIP REPLACMENT  01/19/2019   EVALUATION UNDER ANESTHESIA WITH HEMORRHOIDECTOMY N/A 02/24/2017   Procedure: EXAM UNDER ANESTHESIA WITH  HEMORRHOIDECTOMY;  Surgeon: [name], MD;  Location: [facility];  Service: General;  Laterality: N/A;   EXCISIONAL TOTAL HIP ARTHROPLASTY WITH ANTIBIOTIC SPACERS Right 09/25/2020   Procedure: EXCISIONAL TOTAL HIP ARTHROPLASTY WITH ANTIBIOTIC SPACERS;  Surgeon: [name], MD;  Location: [facility];  Service: Orthopedics;  Laterality: Right;   FLEXIBLE SIGMOIDOSCOPY N/A 04/15/2015   Procedure:  UNSEDATED FLEXIBLE SIGMOIDOSCOPY;  Surgeon: [name], MD;  Location: [facility];  Service: Endoscopy;  Laterality: N/A;   HUMERUS IM NAIL Right 12/17/2021   Procedure: INTRAMEDULLARY (IM) NAIL HUMERAL;  Surgeon: [name], MD;  Location: [facility];  Service: Orthopedics;  Laterality: Right;   KNEE ARTHROSCOPY Left ~ 2016   POSTERIOR CERVICAL FUSION/FORAMINOTOMY N/A 06/04/2022   Procedure: Thoracic one - Thoracic Four Posterior lateral arthrodesis/ fusion and Thoracic two Corpectomy;  Surgeon: [name], MD;  Location: [facility];  Service: Neurosurgery;  Laterality: N/A;   REIMPLANTATION OF TOTAL HIP Right 12/25/2020   Procedure: REIMPLANTATION/REVISION OF RIGHT TOTAL HIP VERSUS REPEAT IRRIGATION AND  DEBRIDEMENT;  Surgeon: [name], MD;  Location: [facility];  Service: Orthopedics;  Laterality: Right;   TESTICLE SURGERY  1988   VARICOCELE   TONSILLECTOMY     VARICOSE VEIN SURGERY Bilateral    Patient Active Problem List   Diagnosis Date Noted   Pancreatic cyst 09/30/2022   Infection due to novel influenza A virus 09/29/2022   Persistent atrial fibrillation (HCC) 07/15/2022   Acute on chronic diastolic heart failure (HCC) 07/03/2022   Pulmonary infiltrates 07/01/2022   Acute respiratory failure with hypoxia (HCC) 07/01/2022   Hyponatremia  06/29/2022   History of thoracic surgery 06/29/2022   Acute DVT of left tibial vein (HCC) 06/21/2022   Hypokalemia 06/21/2022   Vitamin D deficiency 05/30/2022   Other constipation 05/29/2022   Pathologic compression fracture of spine, initial encounter (HCC) 05/29/2022   Cord compression (HCC) 05/28/2022   Degenerative cervical disc 05/14/2022   Hypercoagulable state due to persistent atrial fibrillation (HCC) 01/11/2022   Multiple myeloma (HCC) 12/10/2021   Medication monitoring encounter 02/27/2021   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   Status post peripherally inserted central catheter (PICC) central line placement 11/06/2020   Drug rash with eosinophilia and systemic symptoms syndrome 11/06/2020   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   PAF (paroxysmal atrial fibrillation) (HCC)    GERD (gastroesophageal reflux disease) 03/29/2018   Hiatal hernia 03/29/2018   History of DVT (deep vein thrombosis) 03/29/2018   CAP (community acquired pneumonia) 03/29/2018   Osteoarthritis 03/29/2018   Neuropathy 08/29/2017   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   Paroxysmal atrial fibrillation with rapid ventricular response (HCC) 04/05/2017   Prolapsed internal hemorrhoids, grade 4, s/p ligation/pexy/hemorrhoidectomy x 2 02/24/2017 02/24/2017   OSA (obstructive sleep apnea) 07/26/2016   Obesity (BMI 30-39.9) 07/26/2016   Atrial fibrillation with RVR (HCC)    Essential hypertension    Chest pain at rest     PCP: Eleanora Neighbor, MD  REFERRING PROVIDER: Antony Blackbird, MD  REFERRING DIAG: Multiple Myeloma  THERAPY DIAG:  Compression fracture of thoracic vertebra, unspecified thoracic vertebral level, initial encounter (HCC)  Multiple myeloma not having achieved remission (HCC)  Muscle weakness (generalized)  Abnormal posture  ONSET DATE: 2023  Rationale for Evaluation and Treatment: Rehabilitation  SUBJECTIVE:                                                                                                                                                                                            SUBJECTIVE STATEMENT:   I saw the cardiologist and they put me on a new fluid pill Torsemide instead of Lasix..  I saw  the neurologist and he doesn't know why I am still having trouble with my eyes. I did the pool 2 times since I was here last. I am retaining fluid and gained 12# over a few days. I have been given the OK to exercise and I am wearing a heart monitor for 2 weeks.  PERTINENT HISTORY:  JAESEAN OLSTAD 72 y.o. male with medical history significant for IgA Kappa Multiple Myeloma. He was diagnosed with smouldering myeloma in 2018 . Last July he developed right arm pain due to a pathologic fx and he underwent radiation and fixation. In early Feb 2024 he had radiation for pathologic T2 compression fx followed by a T1-T4 fusion. PAIN:  Are you having pain? Are you having pain? yes NPRS scale: 2/10 Pain location: shoulder blades and neck, bilateral thoracic paraspinals, Right upper arm Pain orientation: Bilateral  PAIN TYPE: aching and tight Pain description: constant  Aggravating factors: getting up and down, looking down Relieving factors: sleeping, laying down.   PRECAUTIONS: Multiple Myeloma with mets to R ribs, R humerus with ORIF for pathological fracture 12/17/21. Lesions have also been found in R rib and also in his skull. Pathologic fx T2 with fusion T1-4 on 06/04/22, Bilateral LE edema (on Lasix), Afib   WEIGHT BEARING RESTRICTIONS: No  FALLS:  Has patient fallen in last 6 months? Yes. Number of falls 1  LIVING ENVIRONMENT: Lives with: lives with their family and lives with their spouse Lives in: House/apartment Stairs: Yes; Internal: 14 steps; on left going up and External: 3 steps; on right going up Has following equipment at home: Single point cane and Walker - 4 wheeled  OCCUPATION: attorney/ mostly retired?  LEISURE:  golf (can't do now.), use to play racquetball  HAND DOMINANCE: right   PRIOR LEVEL OF FUNCTION: Independent  POSTURE:significant slump sitting posture with forward head  PATIENT GOALS: decrease pain,get stronger,improve balance   OBJECTIVE:  COGNITION: Overall cognitive status: Within functional limits for tasks assessed   PALPATION: Very tender bilateral UT/levator, scapular regions  OBSERVATIONS / OTHER ASSESSMENTS: very flexed head and neck with chin nearly at chest  SENSATION: Light touch: Appears intact    POSTURE: significant forward head, round shoulders, increased kyphosis   Gait holding single point cane in left hand but not using. Right shoulder low, flexed neck;difficulty raising head to see where he is going  UPPER EXTREMITY AROM/PROM:  A/PROM RIGHT   eval  RIGHT 11/08/2022  Shoulder extension    Shoulder flexion 91 with compensation 101 with compensation   Shoulder abduction 67 with compensation 75 with compensation  Shoulder internal rotation    Shoulder external rotation C6     (Blank rows = not tested)  A/PROM LEFT   eval  Shoulder extension   Shoulder flexion 123  Shoulder abduction 112  Shoulder internal rotation T7  Shoulder external rotation t3    (Blank rows = not tested)  CERVICAL AROM: All within normal limits:    Percent limited 11/08/22  Flexion Fully flexed   Extension From flexed position decreased 80% Decreased 70%  Right lateral flexion    Left lateral flexion    Right rotation Dec 50% sitting in FH posture   Left rotation        Dec 50%     UPPER EXTREMITY STRENGTH:    LOWER EXTREMITY AROM/PROM:  A/PROM Right eval  Hip flexion   Hip extension   Hip abduction   Hip adduction   Hip internal rotation   Hip  external rotation   Knee flexion   Knee extension   Ankle dorsiflexion   Ankle plantarflexion   Ankle inversion   Ankle eversion    (Blank rows = not tested)  A/PROM LEFT eval  Hip flexion   Hip  extension   Hip abduction   Hip adduction   Hip internal rotation   Hip external rotation   Knee flexion   Knee extension   Ankle dorsiflexion   Ankle plantarflexion   Ankle inversion   Ankle eversion    (Blank rows = not tested)  LOWER EXTREMITY MMT:    10/04/2022  4 position balance test; able to hold DLS feet together , and half tandem stance 10 seconds. Unable to maintain full tandem stance for 10 sec, unable to hold SLS greater than a second or 2 on left, and 4 seconds on right.     LYMPHEDEMA ASSESSMENTS:   SURGERY TYPE/DATE: 8/17/23Left prox humerus fixation for pathologic fx  2/2/2024Fusion T1-T4 for pathologic fx with cord compression 06/04/22 posterior cervical fusion/foraminotomy;T1-4 fusion NUMBER OF LYMPH NODES REMOVED: NA  CHEMOTHERAPY: Velcade/promalyst/Dex;  RADIATION:to prox humerus and thoracic spine pathologic fx  HORMONE TREATMENT: NA  INFECTIONS: NA  FUNCTIONAL TESTS:  10/09/2022;30 second sit to stand 10 with heavy use of arms  GAIT: Distance walked: 20 Assistive device utilized: carried SPC in left Level of assistance: Complete Independence Comments: feels unsteady, very flexed neck posture   QUICK DASH SURVEY:    TODAY'S TREATMENT:                                                                                                                                         DATE:  11/19/2022  Nu step seat 12, UE 8, lev 5 x 8 min Heel raises x 10, marching x 10 B, No HH Partial step ups 6 " 2 x 10, seated rest Forward and sideways step ups on ax x 10 B Vector reaches x5 B Sidestepping 4 laps in bars, seated rest STM to bil UT/upper scap region and cervical muscles and bilateral paraspinal muscles in prop sitting with cocoa butter  11/17/22:Pt seen for aquatic therapy today.  Treatment took place in water 3.5-4.75 ft in depth at the [facility] pool. Temp of water was 91.  Pt entered/exited the pool via stairs with bilat rail independently.  Pt requires buoyancy of water for support and to offload joints with strengthening exercises.  Seated water bench with 75% submersion Pt performed seated LE AROM exercises 20x in all planes, with concurrent discussion of his current health status and pain assessment. 75% depth water walking with single buoy UE wts for push/pull 8 lengths. 3 way stance: parallel, tandem both sides with hand paddles: 10x bil arms forward & backwards. 10 min horizontal float with concurrent skin rolling on upper back.   11/09/22 Pt seen for aquatic therapy today.  Treatment took place in water  3.5-4.75 ft in depth at the [facility] pool. Temp of water was 91.  Pt entered/exited the pool via stairs with bilat rail independently Upon arrival:  HR 58-62, spO2 96%. *With UE on yellow hand floats:  walking forward and backward with row motion with arms * side stepping with UE support on rainbow hand floats, then side stepping with arm addct/abdct -> moved to yellow hand floats * staggered stance with bilat shoulder horiz abdct/ addct x 5 each LE forward * tricep push down with yellow hand floats x 10  * UE support on yellow hand floats: SLS with 3 way toe touch x 5 each side * return to walking forward/ backward with row motion of arms * UE support on yellow hand floasts: tandem stance x 20sec each LE forward; tandem gait forward/ backward  * staggered stance with bilat shoulder horiz abdct/ addct x 5 each LE forward * seated at bench - cycling, alternating LAQ with DF, and long sitting hip abdct/ addct;  STS with forward arm reach x 10 * at stairs: supported back float with noodle behind back (under arms), under knees, and nekdoodle;  CGA to transition up to sitting on stairs from back float.     11/08/2022 O2 97, HR 55 and regularly Nustep UE 7, LE 12, lev 5 x 7 min Assessed shoulder and cervical ROM, tested tandem stance balance and 30 second sit to stand 30 sec Sit to stand 10 using arms, O2 97, HR  61 Tandem stance x 3 B, able to  maintain Right foot forward x 10 sec but not left forward Sidestepping in bars x 3 laps Partial step ups x 15 B SLS x 5 ea to failure Mini squats x 10 o2 94, HR 65 Showed how to perform mini squats to high bed for better form STM to bil UT/upper scap region and cervical muscles and bilateral paraspinal muscles in prop sitting with cocoa butter 11/01/22 NuStep Level 5 seat 12, UE 7 x 4 mins; HR 70-80, allowed to rest;repeated x 4 min HR 70-100 intermittently., 687, RPE 3 Pt in gait belt Heel raises x 15 light HH Marching x 10 B intermittent light HH Sidestepping in bars x 4 laps RPE 1, HR 81-111 Step ups on ax forward and sideways x 10 with CGA but no LOB Instructed and demonstrated to pt supine to and from sit on mat table Bridging 2 x 5  Supine cervical retraction into pillow with PT hands guiding x 10 STM to bil UT/upper scap region and cervical muscles and bilateral paraspinal muscles in prop sitting with cocoa butter    PATIENT EDUCATION:  Education details: sitting posture with lumbar roll Person educated: Patient Education method: Medical illustrator Education comprehension: verbalized understanding and returned demonstration  HOME EXERCISE PROGRAM: Doing HEP from HHPT; cervical ROM, shoulder rolls, shrugs, scap retraction, UT stretches  Aquatic Access Code: [email or web address] URL: [email or web address] Date: 11/09/2022 Prepared by: [facility] [address] This aquatic home exercise program from Med[employer] utilizes pictures from land based exercises, but has been adapted prior to lamination and issuance.    ASSESSMENT:  CLINICAL IMPRESSION:  Pt still becomes winded fairly easily but does maintain good 02 saturation of 95%. HR not elevating much and recovers quickly to 60 BPM. Pt continues to have difficulty getting his head/neck to a more erect position, but he feels he is improving.upper back and cervical  muscles remain tight.   OBJECTIVE IMPAIRMENTS: decreased activity tolerance, decreased balance, difficulty  walking, decreased ROM, decreased strength, increased edema, impaired UE functional use, postural dysfunction, and pain.   ACTIVITY LIMITATIONS: carrying, lifting, dressing, reach over head, and locomotion level  PARTICIPATION LIMITATIONS: cleaning, laundry, driving, and occupation  PERSONAL FACTORS:  Multiple Myeloma  are also affecting patient's functional outcome.   REHAB POTENTIAL: Good  CLINICAL DECISION MAKING: Evolving/moderate complexity  EVALUATION COMPLEXITY: Moderate  GOALS: Goals reviewed with patient? Yes  SHORT TERM GOALS: Target date: 10/21/22  Pt will be educated in and independent in proper sitting posture using lumbar roll prn Baseline: Goal status; MET 11/08/22 2.  Pt will report decreased muscle soreness in bilateral upper back x 25% Baseline:  Goal status: ONGOING  3.  Pt will be compliant with a HEP for postural strengthening/ROM Baseline:  Goal status: MET / LONG TERM GOALS: Target date: 12/20/22  Pt will be able to bring his head to neutral position for safety with ambulation/driving Baseline:  Goal status:Ongoing 2.  Pt will have decreased upper back pain by 50% or more Baseline:  Goal status:Ongoing 3.  Pt will be able to flex right shoulder to 115 degrees with minimal compensation for improved reaching ability Baseline:  Goal status: ONGOING 4; Pt will be able to maintain tandem stance on both sides x 10 sec to demonstrated improved balance. Goal status:MET right foot forward 11/08/2022, Ongoing left foot forward  5. Pt will be able to perform sit to stand x 5 without use of Ue's  Baseline:must use hands  Goal status:New  PT FREQUENCY: 2x/week  PT DURATION: 6 weeks  PLANNED INTERVENTIONS: Therapeutic exercises, Therapeutic activity, Neuromuscular re-education, Balance training, Gait training, Patient/Family education, Self Care, Joint  mobilization, Aquatic Therapy, Manual therapy, and Re-evaluation  PLAN FOR NEXT SESSION: Cont land and aquatic exercises working on posture, strength, balance, STM prn for pain   Alvira Monday 11/19/22 12:11 PM   [place]

## 2022-11-22 ENCOUNTER — Ambulatory Visit: Payer: Medicare Other

## 2022-11-22 ENCOUNTER — Other Ambulatory Visit: Payer: Self-pay | Admitting: *Deleted

## 2022-11-22 ENCOUNTER — Encounter: Payer: Self-pay | Admitting: Internal Medicine

## 2022-11-22 DIAGNOSIS — M6281 Muscle weakness (generalized): Secondary | ICD-10-CM

## 2022-11-22 DIAGNOSIS — S22000A Wedge compression fracture of unspecified thoracic vertebra, initial encounter for closed fracture: Secondary | ICD-10-CM

## 2022-11-22 DIAGNOSIS — R293 Abnormal posture: Secondary | ICD-10-CM | POA: Diagnosis not present

## 2022-11-22 DIAGNOSIS — C9000 Multiple myeloma not having achieved remission: Secondary | ICD-10-CM

## 2022-11-22 DIAGNOSIS — M542 Cervicalgia: Secondary | ICD-10-CM | POA: Diagnosis not present

## 2022-11-22 DIAGNOSIS — S22000D Wedge compression fracture of unspecified thoracic vertebra, subsequent encounter for fracture with routine healing: Secondary | ICD-10-CM | POA: Diagnosis not present

## 2022-11-22 MED ORDER — POMALIDOMIDE 2 MG PO CAPS
2.0000 mg | ORAL_CAPSULE | Freq: Every day | ORAL | 0 refills | Status: DC
Start: 2022-11-22 — End: 2022-12-29

## 2022-11-22 NOTE — Therapy (Addendum)
OUTPATIENT PHYSICAL THERAPY ONCOLOGY TREATMENT  Patient Name: Travis [name] MRN: [id number] DOB:07/22/1950, 72 y.o., male Today's Date: 11/22/2022   END OF SESSION:  PT End of Session - 11/22/22 1104     Visit Number 15    Number of Visits 23    Date for PT Re-Evaluation 12/20/22    PT Start Time 1105    PT Stop Time 1155    PT Time Calculation (min) 50 min    Equipment Utilized During Treatment Gait belt    Activity Tolerance Patient tolerated treatment well    Behavior During Therapy WFL for tasks assessed/performed               Past Medical History:  Diagnosis Date   A-fib (HCC)    Arthritis    "comes and goes; mostly in hands, occasionally elbow" (04/05/2017)   Complication of anesthesia    "just wanted to sleep alot  for couple days S/P VARICOCELE OR" (04/05/2017)   DVT (deep venous thrombosis) (HCC)    LLE   Dysrhythmia    PVCs    GERD (gastroesophageal reflux disease)    History of hiatal hernia    History of radiation therapy    Thoracic Spine- 07/13/22-07/23/22-Dr. Antony Blackbird   Hypertension    Impaired glucose tolerance    Multiple myeloma (HCC)    Obesity (BMI 30-39.9) 07/26/2016   OSA on CPAP 07/26/2016   Mild with AHI 12.5/hr now on CPAP at 14cm H2O   PAF (paroxysmal atrial fibrillation) (HCC)    s/p PVI Ablation in 2018 // Flecainide Rx // Echo 8/21: EF 60-65, no RWMA, mild LVH, normal RV SF, moderate RVE, mild BAE, trivial MR, mild dilation of aortic root (40 mm)   Pneumonia    "may have had walking pneumonia a few years ago" (04/05/2017)   Pre-diabetes    Thrombophlebitis    Past Surgical History:  Procedure Laterality Date   ATRIAL FIBRILLATION ABLATION  04/05/2017   ATRIAL FIBRILLATION ABLATION N/A 04/05/2017   Procedure: ATRIAL FIBRILLATION ABLATION;  Surgeon: [name], MD;  Location: [facility];  Service: Cardiovascular;  Laterality: N/A;   BONE BIOPSY Right 12/17/2021   Procedure: BONE BIOPSY;  Surgeon: [name],  MD;  Location: [facility];  Service: Orthopedics;  Laterality: Right;   COMPLETE RIGHT HIP REPLACMENT  01/19/2019   EVALUATION UNDER ANESTHESIA WITH HEMORRHOIDECTOMY N/A 02/24/2017   Procedure: EXAM UNDER ANESTHESIA WITH  HEMORRHOIDECTOMY;  Surgeon: [name], MD;  Location: [facility];  Service: General;  Laterality: N/A;   EXCISIONAL TOTAL HIP ARTHROPLASTY WITH ANTIBIOTIC SPACERS Right 09/25/2020   Procedure: EXCISIONAL TOTAL HIP ARTHROPLASTY WITH ANTIBIOTIC SPACERS;  Surgeon: [name], MD;  Location: [facility];  Service: Orthopedics;  Laterality: Right;   FLEXIBLE SIGMOIDOSCOPY N/A 04/15/2015   Procedure:  UNSEDATED FLEXIBLE SIGMOIDOSCOPY;  Surgeon: [name], MD;  Location: [facility];  Service: Endoscopy;  Laterality: N/A;   HUMERUS IM NAIL Right 12/17/2021   Procedure: INTRAMEDULLARY (IM) NAIL HUMERAL;  Surgeon: [name], MD;  Location:  SURGERY CENTER;  Service: Orthopedics;  Laterality: Right;   KNEE ARTHROSCOPY Left ~ 2016   POSTERIOR CERVICAL FUSION/FORAMINOTOMY N/A 06/04/2022   Procedure: Thoracic one - Thoracic Four Posterior lateral arthrodesis/ fusion and Thoracic two Corpectomy;  Surgeon: [name], MD;  Location: [facility];  Service: Neurosurgery;  Laterality: N/A;   REIMPLANTATION OF TOTAL HIP Right 12/25/2020   Procedure: REIMPLANTATION/REVISION OF RIGHT TOTAL HIP VERSUS REPEAT IRRIGATION AND  DEBRIDEMENT;  Surgeon: [name], MD;  Location: [facility];  Service: Orthopedics;  Laterality: Right;   TESTICLE SURGERY  1988   VARICOCELE   TONSILLECTOMY     VARICOSE VEIN SURGERY Bilateral    Patient Active Problem List   Diagnosis Date Noted   Pancreatic cyst 09/30/2022   Infection due to novel influenza A virus 09/29/2022   Persistent atrial fibrillation (HCC) 07/15/2022   Acute on chronic diastolic heart failure (HCC) 07/03/2022   Pulmonary infiltrates 07/01/2022   Acute respiratory failure with hypoxia (HCC) 07/01/2022   Hyponatremia  06/29/2022   History of thoracic surgery 06/29/2022   Acute DVT of left tibial vein (HCC) 06/21/2022   Hypokalemia 06/21/2022   Vitamin D deficiency 05/30/2022   Other constipation 05/29/2022   Pathologic compression fracture of spine, initial encounter (HCC) 05/29/2022   Cord compression (HCC) 05/28/2022   Degenerative cervical disc 05/14/2022   Hypercoagulable state due to persistent atrial fibrillation (HCC) 01/11/2022   Multiple myeloma (HCC) 12/10/2021   Medication monitoring encounter 02/27/2021   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   Status post peripherally inserted central catheter (PICC) central line placement 11/06/2020   Drug rash with eosinophilia and systemic symptoms syndrome 11/06/2020   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   PAF (paroxysmal atrial fibrillation) (HCC)    GERD (gastroesophageal reflux disease) 03/29/2018   Hiatal hernia 03/29/2018   History of DVT (deep vein thrombosis) 03/29/2018   CAP (community acquired pneumonia) 03/29/2018   Osteoarthritis 03/29/2018   Neuropathy 08/29/2017   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   Paroxysmal atrial fibrillation with rapid ventricular response (HCC) 04/05/2017   Prolapsed internal hemorrhoids, grade 4, s/p ligation/pexy/hemorrhoidectomy x 2 02/24/2017 02/24/2017   OSA (obstructive sleep apnea) 07/26/2016   Obesity (BMI 30-39.9) 07/26/2016   Atrial fibrillation with RVR (HCC)    Essential hypertension    Chest pain at rest     PCP: Eleanora Neighbor, MD  REFERRING PROVIDER: Antony Blackbird, MD  REFERRING DIAG: Multiple Myeloma  THERAPY DIAG:  Compression fracture of thoracic vertebra, unspecified thoracic vertebral level, initial encounter (HCC)  Multiple myeloma not having achieved remission (HCC)  Muscle weakness (generalized)  Abnormal posture  Neck pain  ONSET DATE: 2023  Rationale for Evaluation and Treatment: Rehabilitation  SUBJECTIVE:                                                                                                                                                                                            SUBJECTIVE STATEMENT:  My weight went up 4 lbs last night. I took my Toresemide this am. It went down 6lbs  sat to Sunday when I took the meds. I was a little more sore after last visit for some reason  PERTINENT HISTORY:  [name] 72 y.o. male with medical history significant for IgA Kappa Multiple Myeloma. He was diagnosed with smouldering myeloma in 2018 . Last July he developed right arm pain due to a pathologic fx and he underwent radiation and fixation. In early Feb 2024 he had radiation for pathologic T2 compression fx followed by a T1-T4 fusion. PAIN:  Are you having pain? Are you having pain? yes NPRS scale: 2/10, 3/10 shoulder blades Pain location: shoulder blades and neck, bilateral thoracic paraspinals, Right upper arm Pain orientation: Bilateral  PAIN TYPE: aching and tight Pain description: constant  Aggravating factors: getting up and down, looking down Relieving factors: sleeping, laying down.   PRECAUTIONS: Multiple Myeloma with mets to R ribs, R humerus with ORIF for pathological fracture 12/17/21. Lesions have also been found in R rib and also in his skull. Pathologic fx T2 with fusion T1-4 on 06/04/22, Bilateral LE edema (on Lasix), Afib   WEIGHT BEARING RESTRICTIONS: No  FALLS:  Has patient fallen in last 6 months? Yes. Number of falls 1  LIVING ENVIRONMENT: Lives with: lives with their family and lives with their spouse Lives in: House/apartment Stairs: Yes; Internal: 14 steps; on left going up and External: 3 steps; on right going up Has following equipment at home: Single point cane and Walker - 4 wheeled  OCCUPATION: attorney/ mostly retired?  LEISURE: golf (can't do now.), use to play racquetball  HAND DOMINANCE: right   PRIOR LEVEL OF FUNCTION: Independent  POSTURE:significant slump sitting  posture with forward head  PATIENT GOALS: decrease pain,get stronger,improve balance   OBJECTIVE:  COGNITION: Overall cognitive status: Within functional limits for tasks assessed   PALPATION: Very tender bilateral UT/levator, scapular regions  OBSERVATIONS / OTHER ASSESSMENTS: very flexed head and neck with chin nearly at chest  SENSATION: Light touch: Appears intact    POSTURE: significant forward head, round shoulders, increased kyphosis   Gait holding single point cane in left hand but not using. Right shoulder low, flexed neck;difficulty raising head to see where he is going  UPPER EXTREMITY AROM/PROM:  A/PROM RIGHT   eval  RIGHT 11/08/2022  Shoulder extension    Shoulder flexion 91 with compensation 101 with compensation   Shoulder abduction 67 with compensation 75 with compensation  Shoulder internal rotation    Shoulder external rotation C6     (Blank rows = not tested)  A/PROM LEFT   eval  Shoulder extension   Shoulder flexion 123  Shoulder abduction 112  Shoulder internal rotation T7  Shoulder external rotation t3    (Blank rows = not tested)  CERVICAL AROM: All within normal limits:    Percent limited 11/08/22  Flexion Fully flexed   Extension From flexed position decreased 80% Decreased 70%  Right lateral flexion    Left lateral flexion    Right rotation Dec 50% sitting in FH posture   Left rotation        Dec 50%     UPPER EXTREMITY STRENGTH:    LOWER EXTREMITY AROM/PROM:  A/PROM Right eval  Hip flexion   Hip extension   Hip abduction   Hip adduction   Hip internal rotation   Hip external rotation   Knee flexion   Knee extension   Ankle dorsiflexion   Ankle plantarflexion   Ankle inversion   Ankle eversion    (Blank rows =  not tested)  A/PROM LEFT eval  Hip flexion   Hip extension   Hip abduction   Hip adduction   Hip internal rotation   Hip external rotation   Knee flexion   Knee extension   Ankle dorsiflexion    Ankle plantarflexion   Ankle inversion   Ankle eversion    (Blank rows = not tested)  LOWER EXTREMITY MMT:    10/04/2022  4 position balance test; able to hold DLS feet together , and half tandem stance 10 seconds. Unable to maintain full tandem stance for 10 sec, unable to hold SLS greater than a second or 2 on left, and 4 seconds on right.     LYMPHEDEMA ASSESSMENTS:   SURGERY TYPE/DATE: 8/17/23Left prox humerus fixation for pathologic fx  2/2/2024Fusion T1-T4 for pathologic fx with cord compression 06/04/22 posterior cervical fusion/foraminotomy;T1-4 fusion NUMBER OF LYMPH NODES REMOVED: NA  CHEMOTHERAPY: Velcade/promalyst/Dex;  RADIATION:to prox humerus and thoracic spine pathologic fx  HORMONE TREATMENT: NA  INFECTIONS: NA  FUNCTIONAL TESTS:  10/09/2022;30 second sit to stand 10 with heavy use of arms  GAIT: Distance walked: 20 Assistive device utilized: carried SPC in left Level of assistance: Complete Independence Comments: feels unsteady, very flexed neck posture   QUICK DASH SURVEY:    TODAY'S TREATMENT:                                                                                                                                         DATE:  11/22/2022 HR 54, O2 sats 96% at  rest Nu step seat 12, UE 8, lev 4 x 8 min, 595 steps, O2 98, HR 53 6 in steps 2 x 5 ea Mini squats 3 x 5  seated rest  Ambulated 255 feet, 02 96, HR ,68 seated rest Incline stretch x 3 Step and hold on ax forward and sideways x 10 ea seated rest Sit to stand 2 x 5  , O2 93 up to 95, HR 52-56, seated rest STM to bil UT/upper scap region and cervical muscles and bilateral paraspinal muscles in prop sitting with cocoa butter  11/19/2022 Nu step seat 12, UE 8, lev 5 x 8 min Heel raises x 10, marching x 10 B, No HH Partial step ups 6 " 2 x 10, seated rest Forward and sideways step ups on ax x 10 B Vector reaches x5 B Sidestepping 4 laps in bars, seated rest STM to bil UT/upper scap  region and cervical muscles and bilateral paraspinal muscles in prop sitting with cocoa butter  11/17/22:Pt seen for aquatic therapy today.  Treatment took place in water 3.5-4.75 ft in depth at the [facility] pool. Temp of water was 91.  Pt entered/exited the pool via stairs with bilat rail independently. Pt requires buoyancy of water for support and to offload joints with strengthening exercises.  Seated water bench with 75% submersion Pt performed seated LE  AROM exercises 20x in all planes, with concurrent discussion of his current health status and pain assessment. 75% depth water walking with single buoy UE wts for push/pull 8 lengths. 3 way stance: parallel, tandem both sides with hand paddles: 10x bil arms forward & backwards. 10 min horizontal float with concurrent skin rolling on upper back.   11/09/22 Pt seen for aquatic therapy today.  Treatment took place in water 3.5-4.75 ft in depth at the [facility] pool. Temp of water was 91.  Pt entered/exited the pool via stairs with bilat rail independently Upon arrival:  HR 58-62, spO2 96%. *With UE on yellow hand floats:  walking forward and backward with row motion with arms * side stepping with UE support on rainbow hand floats, then side stepping with arm addct/abdct -> moved to yellow hand floats * staggered stance with bilat shoulder horiz abdct/ addct x 5 each LE forward * tricep push down with yellow hand floats x 10  * UE support on yellow hand floats: SLS with 3 way toe touch x 5 each side * return to walking forward/ backward with row motion of arms * UE support on yellow hand floasts: tandem stance x 20sec each LE forward; tandem gait forward/ backward  * staggered stance with bilat shoulder horiz abdct/ addct x 5 each LE forward * seated at bench - cycling, alternating LAQ with DF, and long sitting hip abdct/ addct;  STS with forward arm reach x 10 * at stairs: supported back float with noodle behind back (under  arms), under knees, and nekdoodle;  CGA to transition up to sitting on stairs from back float.     11/08/2022 O2 97, HR 55 and regularly Nustep UE 7, LE 12, lev 5 x 7 min Assessed shoulder and cervical ROM, tested tandem stance balance and 30 second sit to stand 30 sec Sit to stand 10 using arms, O2 97, HR 61 Tandem stance x 3 B, able to  maintain Right foot forward x 10 sec but not left forward Sidestepping in bars x 3 laps Partial step ups x 15 B SLS x 5 ea to failure Mini squats x 10 o2 94, HR 65 Showed how to perform mini squats to high bed for better form STM to bil UT/upper scap region and cervical muscles and bilateral paraspinal muscles in prop sitting with cocoa butter 11/01/22 NuStep Level 5 seat 12, UE 7 x 4 mins; HR 70-80, allowed to rest;repeated x 4 min HR 70-100 intermittently., 687, RPE 3 Pt in gait belt Heel raises x 15 light HH Marching x 10 B intermittent light HH Sidestepping in bars x 4 laps RPE 1, HR 81-111 Step ups on ax forward and sideways x 10 with CGA but no LOB Instructed and demonstrated to pt supine to and from sit on mat table Bridging 2 x 5  Supine cervical retraction into pillow with PT hands guiding x 10 STM to bil UT/upper scap region and cervical muscles and bilateral paraspinal muscles in prop sitting with cocoa butter    PATIENT EDUCATION:  Education details: sitting posture with lumbar roll Person educated: Patient Education method: Medical illustrator Education comprehension: verbalized understanding and returned demonstration  HOME EXERCISE PROGRAM: Doing HEP from HHPT; cervical ROM, shoulder rolls, shrugs, scap retraction, UT stretches  Aquatic Access Code: [email or web address] URL: [email or web address] Date: 11/09/2022 Prepared by: [facility] [address] This aquatic home exercise program from MedBridge utilizes pictures from land based exercises, but has been adapted prior  to lamination and issuance.     ASSESSMENT:  CLINICAL IMPRESSION:  Pt continues to retain fluid and he is in frequent  contact with physician regarding his torsemide with his wt fluctuating up and down. His legs are swollen and pitting today. HR not elevating much with activity, but O2 sats if slightly decreased recover quickly.  OBJECTIVE IMPAIRMENTS: decreased activity tolerance, decreased balance, difficulty walking, decreased ROM, decreased strength, increased edema, impaired UE functional use, postural dysfunction, and pain.   ACTIVITY LIMITATIONS: carrying, lifting, dressing, reach over head, and locomotion level  PARTICIPATION LIMITATIONS: cleaning, laundry, driving, and occupation  PERSONAL FACTORS:  Multiple Myeloma  are also affecting patient's functional outcome.   REHAB POTENTIAL: Good  CLINICAL DECISION MAKING: Evolving/moderate complexity  EVALUATION COMPLEXITY: Moderate  GOALS: Goals reviewed with patient? Yes  SHORT TERM GOALS: Target date: 10/21/22  Pt will be educated in and independent in proper sitting posture using lumbar roll prn Baseline: Goal status; MET 11/08/22 2.  Pt will report decreased muscle soreness in bilateral upper back x 25% Baseline:  Goal status: ONGOING  3.  Pt will be compliant with a HEP for postural strengthening/ROM Baseline:  Goal status: MET / LONG TERM GOALS: Target date: 12/20/22  Pt will be able to bring his head to neutral position for safety with ambulation/driving Baseline:  Goal status:Ongoing 2.  Pt will have decreased upper back pain by 50% or more Baseline:  Goal status:Ongoing 3.  Pt will be able to flex right shoulder to 115 degrees with minimal compensation for improved reaching ability Baseline:  Goal status: ONGOING 4; Pt will be able to maintain tandem stance on both sides x 10 sec to demonstrated improved balance. Goal status:MET right foot forward 11/08/2022, Ongoing left foot forward  5. Pt will be able to perform sit to stand x 5  without use of Ue's  Baseline:must use hands  Goal status:New  PT FREQUENCY: 2x/week  PT DURATION: 6 weeks  PLANNED INTERVENTIONS: Therapeutic exercises, Therapeutic activity, Neuromuscular re-education, Balance training, Gait training, Patient/Family education, Self Care, Joint mobilization, Aquatic Therapy, Manual therapy, and Re-evaluation  PLAN FOR NEXT SESSION: Cont land and aquatic exercises working on posture, strength, balance, STM prn for pain  Alvira Monday, PT 11/22/22 12:12 PM

## 2022-11-24 ENCOUNTER — Inpatient Hospital Stay: Payer: Medicare Other

## 2022-11-24 ENCOUNTER — Ambulatory Visit: Payer: Medicare Other | Admitting: Physical Therapy

## 2022-11-24 ENCOUNTER — Encounter: Payer: Self-pay | Admitting: Physician Assistant

## 2022-11-24 ENCOUNTER — Ambulatory Visit: Payer: Medicare Other

## 2022-11-24 ENCOUNTER — Other Ambulatory Visit: Payer: Medicare Other

## 2022-11-24 ENCOUNTER — Other Ambulatory Visit: Payer: Self-pay | Admitting: Physician Assistant

## 2022-11-24 ENCOUNTER — Inpatient Hospital Stay (HOSPITAL_BASED_OUTPATIENT_CLINIC_OR_DEPARTMENT_OTHER): Payer: Medicare Other | Admitting: Physician Assistant

## 2022-11-24 ENCOUNTER — Encounter: Payer: Self-pay | Admitting: Physical Therapy

## 2022-11-24 ENCOUNTER — Other Ambulatory Visit: Payer: Self-pay | Admitting: Hematology and Oncology

## 2022-11-24 DIAGNOSIS — G4733 Obstructive sleep apnea (adult) (pediatric): Secondary | ICD-10-CM | POA: Diagnosis not present

## 2022-11-24 DIAGNOSIS — C9000 Multiple myeloma not having achieved remission: Secondary | ICD-10-CM

## 2022-11-24 DIAGNOSIS — G8929 Other chronic pain: Secondary | ICD-10-CM

## 2022-11-24 DIAGNOSIS — R278 Other lack of coordination: Secondary | ICD-10-CM

## 2022-11-24 DIAGNOSIS — R293 Abnormal posture: Secondary | ICD-10-CM | POA: Diagnosis not present

## 2022-11-24 DIAGNOSIS — E876 Hypokalemia: Secondary | ICD-10-CM | POA: Diagnosis not present

## 2022-11-24 DIAGNOSIS — I10 Essential (primary) hypertension: Secondary | ICD-10-CM | POA: Diagnosis not present

## 2022-11-24 DIAGNOSIS — M6281 Muscle weakness (generalized): Secondary | ICD-10-CM

## 2022-11-24 DIAGNOSIS — I480 Paroxysmal atrial fibrillation: Secondary | ICD-10-CM | POA: Diagnosis not present

## 2022-11-24 DIAGNOSIS — R001 Bradycardia, unspecified: Secondary | ICD-10-CM | POA: Diagnosis not present

## 2022-11-24 DIAGNOSIS — M542 Cervicalgia: Secondary | ICD-10-CM

## 2022-11-24 DIAGNOSIS — M5117 Intervertebral disc disorders with radiculopathy, lumbosacral region: Secondary | ICD-10-CM | POA: Diagnosis not present

## 2022-11-24 DIAGNOSIS — Z515 Encounter for palliative care: Secondary | ICD-10-CM | POA: Diagnosis not present

## 2022-11-24 DIAGNOSIS — I700 Atherosclerosis of aorta: Secondary | ICD-10-CM | POA: Diagnosis not present

## 2022-11-24 DIAGNOSIS — M48061 Spinal stenosis, lumbar region without neurogenic claudication: Secondary | ICD-10-CM | POA: Diagnosis not present

## 2022-11-24 DIAGNOSIS — R600 Localized edema: Secondary | ICD-10-CM | POA: Diagnosis not present

## 2022-11-24 DIAGNOSIS — K5900 Constipation, unspecified: Secondary | ICD-10-CM | POA: Diagnosis not present

## 2022-11-24 DIAGNOSIS — M50321 Other cervical disc degeneration at C4-C5 level: Secondary | ICD-10-CM | POA: Diagnosis not present

## 2022-11-24 DIAGNOSIS — K7689 Other specified diseases of liver: Secondary | ICD-10-CM | POA: Diagnosis not present

## 2022-11-24 DIAGNOSIS — K5730 Diverticulosis of large intestine without perforation or abscess without bleeding: Secondary | ICD-10-CM | POA: Diagnosis not present

## 2022-11-24 DIAGNOSIS — M47812 Spondylosis without myelopathy or radiculopathy, cervical region: Secondary | ICD-10-CM | POA: Diagnosis not present

## 2022-11-24 DIAGNOSIS — K4020 Bilateral inguinal hernia, without obstruction or gangrene, not specified as recurrent: Secondary | ICD-10-CM | POA: Diagnosis not present

## 2022-11-24 DIAGNOSIS — K862 Cyst of pancreas: Secondary | ICD-10-CM | POA: Diagnosis not present

## 2022-11-24 DIAGNOSIS — M2578 Osteophyte, vertebrae: Secondary | ICD-10-CM | POA: Diagnosis not present

## 2022-11-24 DIAGNOSIS — Z5112 Encounter for antineoplastic immunotherapy: Secondary | ICD-10-CM | POA: Diagnosis not present

## 2022-11-24 DIAGNOSIS — K429 Umbilical hernia without obstruction or gangrene: Secondary | ICD-10-CM | POA: Diagnosis not present

## 2022-11-24 DIAGNOSIS — S22000D Wedge compression fracture of unspecified thoracic vertebra, subsequent encounter for fracture with routine healing: Secondary | ICD-10-CM | POA: Diagnosis not present

## 2022-11-24 DIAGNOSIS — S22000A Wedge compression fracture of unspecified thoracic vertebra, initial encounter for closed fracture: Secondary | ICD-10-CM

## 2022-11-24 DIAGNOSIS — M47816 Spondylosis without myelopathy or radiculopathy, lumbar region: Secondary | ICD-10-CM | POA: Diagnosis not present

## 2022-11-24 DIAGNOSIS — J90 Pleural effusion, not elsewhere classified: Secondary | ICD-10-CM | POA: Diagnosis not present

## 2022-11-24 LAB — CBC WITH DIFFERENTIAL (CANCER CENTER ONLY)
Abs Immature Granulocytes: 0.04 10*3/uL (ref 0.00–0.07)
Basophils Absolute: 0 10*3/uL (ref 0.0–0.1)
Basophils Relative: 0 %
Eosinophils Absolute: 0.1 10*3/uL (ref 0.0–0.5)
Eosinophils Relative: 1 %
HCT: 35.3 % — ABNORMAL LOW (ref 39.0–52.0)
Hemoglobin: 11.1 g/dL — ABNORMAL LOW (ref 13.0–17.0)
Immature Granulocytes: 0 %
Lymphocytes Relative: 3 %
Lymphs Abs: 0.3 10*3/uL — ABNORMAL LOW (ref 0.7–4.0)
MCH: 29.9 pg (ref 26.0–34.0)
MCHC: 31.4 g/dL (ref 30.0–36.0)
MCV: 95.1 fL (ref 80.0–100.0)
Monocytes Absolute: 0.4 10*3/uL (ref 0.1–1.0)
Monocytes Relative: 4 %
Neutro Abs: 8.8 10*3/uL — ABNORMAL HIGH (ref 1.7–7.7)
Neutrophils Relative %: 92 %
Platelet Count: 184 10*3/uL (ref 150–400)
RBC: 3.71 MIL/uL — ABNORMAL LOW (ref 4.22–5.81)
RDW: 17.2 % — ABNORMAL HIGH (ref 11.5–15.5)
WBC Count: 9.6 10*3/uL (ref 4.0–10.5)
nRBC: 0 % (ref 0.0–0.2)

## 2022-11-24 LAB — CMP (CANCER CENTER ONLY)
ALT: 30 U/L (ref 0–44)
AST: 20 U/L (ref 15–41)
Albumin: 4.1 g/dL (ref 3.5–5.0)
Alkaline Phosphatase: 92 U/L (ref 38–126)
Anion gap: 7 (ref 5–15)
BUN: 27 mg/dL — ABNORMAL HIGH (ref 8–23)
CO2: 27 mmol/L (ref 22–32)
Calcium: 9.1 mg/dL (ref 8.9–10.3)
Chloride: 110 mmol/L (ref 98–111)
Creatinine: 0.85 mg/dL (ref 0.61–1.24)
GFR, Estimated: >60 mL/min (ref >60)
Glucose, Bld: 134 mg/dL — ABNORMAL HIGH (ref 70–99)
Potassium: 4.1 mmol/L (ref 3.5–5.1)
Sodium: 144 mmol/L (ref 135–145)
Total Bilirubin: 0.5 mg/dL (ref 0.3–1.2)
Total Protein: 6.1 g/dL — ABNORMAL LOW (ref 6.5–8.1)

## 2022-11-24 MED ORDER — BORTEZOMIB CHEMO SQ INJECTION 3.5 MG (2.5MG/ML)
1.3000 mg/m2 | Freq: Once | INTRAMUSCULAR | Status: AC
  Administered 2022-11-24: 3 mg via SUBCUTANEOUS
  Filled 2022-11-24: qty 1.2

## 2022-11-24 NOTE — Therapy (Signed)
OUTPATIENT PHYSICAL THERAPY ONCOLOGY TREATMENT  Patient Name: Travis [name] MRN: [id number] DOB:09-21-1950, 72 y.o., male Today's Date: 11/24/2022   END OF SESSION:  PT End of Session - 11/24/22 1103     Visit Number 16    Number of Visits 23    Date for PT Re-Evaluation 12/20/22    PT Start Time 1102    PT Stop Time 1150    PT Time Calculation (min) 48 min    Activity Tolerance Patient tolerated treatment well    Behavior During Therapy WFL for tasks assessed/performed                Past Medical History:  Diagnosis Date   A-fib (HCC)    Arthritis    "comes and goes; mostly in hands, occasionally elbow" (04/05/2017)   Complication of anesthesia    "just wanted to sleep alot  for couple days S/P VARICOCELE OR" (04/05/2017)   DVT (deep venous thrombosis) (HCC)    LLE   Dysrhythmia    PVCs    GERD (gastroesophageal reflux disease)    History of hiatal hernia    History of radiation therapy    Thoracic Spine- 07/13/22-07/23/22-Dr. Antony Blackbird   Hypertension    Impaired glucose tolerance    Multiple myeloma (HCC)    Obesity (BMI 30-39.9) 07/26/2016   OSA on CPAP 07/26/2016   Mild with AHI 12.5/hr now on CPAP at 14cm H2O   PAF (paroxysmal atrial fibrillation) (HCC)    s/p PVI Ablation in 2018 // Flecainide Rx // Echo 8/21: EF 60-65, no RWMA, mild LVH, normal RV SF, moderate RVE, mild BAE, trivial MR, mild dilation of aortic root (40 mm)   Pneumonia    "may have had walking pneumonia a few years ago" (04/05/2017)   Pre-diabetes    Thrombophlebitis    Past Surgical History:  Procedure Laterality Date   ATRIAL FIBRILLATION ABLATION  04/05/2017   ATRIAL FIBRILLATION ABLATION N/A 04/05/2017   Procedure: ATRIAL FIBRILLATION ABLATION;  Surgeon: [name], MD;  Location: [facility];  Service: Cardiovascular;  Laterality: N/A;   BONE BIOPSY Right 12/17/2021   Procedure: BONE BIOPSY;  Surgeon: [name], MD;  Location: [facility];   Service: Orthopedics;  Laterality: Right;   COMPLETE RIGHT HIP REPLACMENT  01/19/2019   EVALUATION UNDER ANESTHESIA WITH HEMORRHOIDECTOMY N/A 02/24/2017   Procedure: EXAM UNDER ANESTHESIA WITH  HEMORRHOIDECTOMY;  Surgeon: [name], MD;  Location: [facility];  Service: General;  Laterality: N/A;   EXCISIONAL TOTAL HIP ARTHROPLASTY WITH ANTIBIOTIC SPACERS Right 09/25/2020   Procedure: EXCISIONAL TOTAL HIP ARTHROPLASTY WITH ANTIBIOTIC SPACERS;  Surgeon: [name], MD;  Location: [facility];  Service: Orthopedics;  Laterality: Right;   FLEXIBLE SIGMOIDOSCOPY N/A 04/15/2015   Procedure:  UNSEDATED FLEXIBLE SIGMOIDOSCOPY;  Surgeon: [name], MD;  Location: [facility];  Service: Endoscopy;  Laterality: N/A;   HUMERUS IM NAIL Right 12/17/2021   Procedure: INTRAMEDULLARY (IM) NAIL HUMERAL;  Surgeon: [name], MD;  Location:  SURGERY CENTER;  Service: Orthopedics;  Laterality: Right;   KNEE ARTHROSCOPY Left ~ 2016   POSTERIOR CERVICAL FUSION/FORAMINOTOMY N/A 06/04/2022   Procedure: Thoracic one - Thoracic Four Posterior lateral arthrodesis/ fusion and Thoracic two Corpectomy;  Surgeon: [name], MD;  Location: [facility];  Service: Neurosurgery;  Laterality: N/A;   REIMPLANTATION OF TOTAL HIP Right 12/25/2020   Procedure: REIMPLANTATION/REVISION OF RIGHT TOTAL HIP VERSUS REPEAT IRRIGATION AND DEBRIDEMENT;  Surgeon: [name], MD;  Location:  [facility];  Service: Orthopedics;  Laterality: Right;   TESTICLE SURGERY  1988   VARICOCELE   TONSILLECTOMY     VARICOSE VEIN SURGERY Bilateral    Patient Active Problem List   Diagnosis Date Noted   Pancreatic cyst 09/30/2022   Infection due to novel influenza A virus 09/29/2022   Persistent atrial fibrillation (HCC) 07/15/2022   Acute on chronic diastolic heart failure (HCC) 07/03/2022   Pulmonary infiltrates 07/01/2022   Acute respiratory failure with hypoxia (HCC) 07/01/2022   Hyponatremia 06/29/2022   History of thoracic surgery  06/29/2022   Acute DVT of left tibial vein (HCC) 06/21/2022   Hypokalemia 06/21/2022   Vitamin D deficiency 05/30/2022   Other constipation 05/29/2022   Pathologic compression fracture of spine, initial encounter (HCC) 05/29/2022   Cord compression (HCC) 05/28/2022   Degenerative cervical disc 05/14/2022   Hypercoagulable state due to persistent atrial fibrillation (HCC) 01/11/2022   Multiple myeloma (HCC) 12/10/2021   Medication monitoring encounter 02/27/2021   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   Status post peripherally inserted central catheter (PICC) central line placement 11/06/2020   Drug rash with eosinophilia and systemic symptoms syndrome 11/06/2020   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   PAF (paroxysmal atrial fibrillation) (HCC)    GERD (gastroesophageal reflux disease) 03/29/2018   Hiatal hernia 03/29/2018   History of DVT (deep vein thrombosis) 03/29/2018   CAP (community acquired pneumonia) 03/29/2018   Osteoarthritis 03/29/2018   Neuropathy 08/29/2017   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   Paroxysmal atrial fibrillation with rapid ventricular response (HCC) 04/05/2017   Prolapsed internal hemorrhoids, grade 4, s/p ligation/pexy/hemorrhoidectomy x 2 02/24/2017 02/24/2017   OSA (obstructive sleep apnea) 07/26/2016   Obesity (BMI 30-39.9) 07/26/2016   Atrial fibrillation with RVR (HCC)    Essential hypertension    Chest pain at rest     PCP: Eleanora Neighbor, MD  REFERRING PROVIDER: Antony Blackbird, MD  REFERRING DIAG: Multiple Myeloma  THERAPY DIAG:  Compression fracture of thoracic vertebra, unspecified thoracic vertebral level, initial encounter (HCC)  Multiple myeloma not having achieved remission (HCC)  Muscle weakness (generalized)  Abnormal posture  Neck pain  Other lack of coordination  Chronic right shoulder pain  ONSET DATE: 2023  Rationale for Evaluation and Treatment: Rehabilitation  SUBJECTIVE:                                                                                                                                                                                            SUBJECTIVE STATEMENT: Up 12# again. Plan to talk with PA today at chemo about everything.   PERTINENT HISTORY:  [name] 72 y.o. male with medical history significant for IgA Kappa Multiple Myeloma. He was diagnosed with smouldering myeloma in 2018 . Last July he developed right arm pain due to a pathologic fx and he underwent radiation and fixation. In early Feb 2024 he had radiation for pathologic T2 compression fx followed by a T1-T4 fusion. PAIN:  Are you having pain? Are you having pain? yes NPRS scale: 2/10, 3/10 shoulder blades Pain location: shoulder blades and neck, bilateral thoracic paraspinals, Right upper arm Pain orientation: Bilateral  PAIN TYPE: aching and tight Pain description: constant  Aggravating factors: getting up and down, looking down Relieving factors: sleeping, laying down.   PRECAUTIONS: Multiple Myeloma with mets to R ribs, R humerus with ORIF for pathological fracture 12/17/21. Lesions have also been found in R rib and also in his skull. Pathologic fx T2 with fusion T1-4 on 06/04/22, Bilateral LE edema (on Lasix), Afib   WEIGHT BEARING RESTRICTIONS: No  FALLS:  Has patient fallen in last 6 months? Yes. Number of falls 1  LIVING ENVIRONMENT: Lives with: lives with their family and lives with their spouse Lives in: House/apartment Stairs: Yes; Internal: 14 steps; on left going up and External: 3 steps; on right going up Has following equipment at home: Single point cane and Walker - 4 wheeled  OCCUPATION: attorney/ mostly retired?  LEISURE: golf (can't do now.), use to play racquetball  HAND DOMINANCE: right   PRIOR LEVEL OF FUNCTION: Independent  POSTURE:significant slump sitting posture with forward head  PATIENT GOALS: decrease pain,get stronger,improve  balance   OBJECTIVE:  COGNITION: Overall cognitive status: Within functional limits for tasks assessed   PALPATION: Very tender bilateral UT/levator, scapular regions  OBSERVATIONS / OTHER ASSESSMENTS: very flexed head and neck with chin nearly at chest  SENSATION: Light touch: Appears intact    POSTURE: significant forward head, round shoulders, increased kyphosis   Gait holding single point cane in left hand but not using. Right shoulder low, flexed neck;difficulty raising head to see where he is going  UPPER EXTREMITY AROM/PROM:  A/PROM RIGHT   eval  RIGHT 11/08/2022  Shoulder extension    Shoulder flexion 91 with compensation 101 with compensation   Shoulder abduction 67 with compensation 75 with compensation  Shoulder internal rotation    Shoulder external rotation C6     (Blank rows = not tested)  A/PROM LEFT   eval  Shoulder extension   Shoulder flexion 123  Shoulder abduction 112  Shoulder internal rotation T7  Shoulder external rotation t3    (Blank rows = not tested)  CERVICAL AROM: All within normal limits:    Percent limited 11/08/22  Flexion Fully flexed   Extension From flexed position decreased 80% Decreased 70%  Right lateral flexion    Left lateral flexion    Right rotation Dec 50% sitting in FH posture   Left rotation        Dec 50%     UPPER EXTREMITY STRENGTH:    LOWER EXTREMITY AROM/PROM:  A/PROM Right eval  Hip flexion   Hip extension   Hip abduction   Hip adduction   Hip internal rotation   Hip external rotation   Knee flexion   Knee extension   Ankle dorsiflexion   Ankle plantarflexion   Ankle inversion   Ankle eversion    (Blank rows = not tested)  A/PROM LEFT eval  Hip flexion   Hip extension   Hip abduction   Hip adduction   Hip  internal rotation   Hip external rotation   Knee flexion   Knee extension   Ankle dorsiflexion   Ankle plantarflexion   Ankle inversion   Ankle eversion    (Blank rows = not  tested)  LOWER EXTREMITY MMT:    10/04/2022  4 position balance test; able to hold DLS feet together , and half tandem stance 10 seconds. Unable to maintain full tandem stance for 10 sec, unable to hold SLS greater than a second or 2 on left, and 4 seconds on right.     LYMPHEDEMA ASSESSMENTS:   SURGERY TYPE/DATE: 8/17/23Left prox humerus fixation for pathologic fx  2/2/2024Fusion T1-T4 for pathologic fx with cord compression 06/04/22 posterior cervical fusion/foraminotomy;T1-4 fusion NUMBER OF LYMPH NODES REMOVED: NA  CHEMOTHERAPY: Velcade/promalyst/Dex;  RADIATION:to prox humerus and thoracic spine pathologic fx  HORMONE TREATMENT: NA  INFECTIONS: NA  FUNCTIONAL TESTS:  10/09/2022;30 second sit to stand 10 with heavy use of arms  GAIT: Distance walked: 20 Assistive device utilized: carried SPC in left Level of assistance: Complete Independence Comments: feels unsteady, very flexed neck posture   QUICK DASH SURVEY:    TODAY'S TREATMENT:                                                                                                                                         DATE:   11/24/22:Pt seen for aquatic therapy today.  Treatment took place in water 3.5-4.75 ft in depth at the [facility] pool. Temp of water was 90.  Pt entered/exited the pool via stairs with bilat rail independently. Pt requires buoyancy of water for support and to offload joints with strengthening exercises.  Seated water bench with 75% submersion Pt performed seated LE AROM exercises 20x in all planes, with concurrent discussion of his current health status and pain assessment. 75% depth water walking with single buoy UE wts for push/pull 8 lengths. 3 way stance: parallel, tandem both sides with hand paddles: 10x2 bil arms forward & backwards. Hip 3 way kicks 10x Bil holding onto wall for balance.Bil heel raises 15x no UE support needed in water. 10 min horizontal float with concurrent skin rolling  on upper back.    11/22/2022 HR 54, O2 sats 96% at  rest Nu step seat 12, UE 8, lev 4 x 8 min, 595 steps, O2 98, HR 53 6 in steps 2 x 5 ea Mini squats 3 x 5  seated rest  Ambulated 255 feet, 02 96, HR ,68 seated rest Incline stretch x 3 Step and hold on ax forward and sideways x 10 ea seated rest Sit to stand 2 x 5  , O2 93 up to 95, HR 52-56, seated rest STM to bil UT/upper scap region and cervical muscles and bilateral paraspinal muscles in prop sitting with cocoa butter  11/19/2022 Nu step seat 12, UE 8, lev 5 x 8 min Heel raises x 10,  marching x 10 B, No HH Partial step ups 6 " 2 x 10, seated rest Forward and sideways step ups on ax x 10 B Vector reaches x5 B Sidestepping 4 laps in bars, seated rest STM to bil UT/upper scap region and cervical muscles and bilateral paraspinal muscles in prop sitting with cocoa butter  PATIENT EDUCATION:  Education details: sitting posture with lumbar roll Person educated: Patient Education method: Medical illustrator Education comprehension: verbalized understanding and returned demonstration  HOME EXERCISE PROGRAM: Doing HEP from HHPT; cervical ROM, shoulder rolls, shrugs, scap retraction, UT stretches  Aquatic Access Code: [email or web address] URL: [email or web address] Date: 11/09/2022 Prepared by: [facility] - [address] This aquatic home exercise program from [employer] utilizes pictures from land based exercises, but has been adapted prior to lamination and issuance.    ASSESSMENT:  CLINICAL IMPRESSION: Pt reports fluid back up this AM. No breathing difficulties today or during aquatic session. Pain was minimal and pt continues to demonstrate improved steadiness in the water.    OBJECTIVE IMPAIRMENTS: decreased activity tolerance, decreased balance, difficulty walking, decreased ROM, decreased strength, increased edema, impaired UE functional use, postural dysfunction, and pain.   ACTIVITY  LIMITATIONS: carrying, lifting, dressing, reach over head, and locomotion level  PARTICIPATION LIMITATIONS: cleaning, laundry, driving, and occupation  PERSONAL FACTORS:  Multiple Myeloma  are also affecting patient's functional outcome.   REHAB POTENTIAL: Good  CLINICAL DECISION MAKING: Evolving/moderate complexity  EVALUATION COMPLEXITY: Moderate  GOALS: Goals reviewed with patient? Yes  SHORT TERM GOALS: Target date: 10/21/22  Pt will be educated in and independent in proper sitting posture using lumbar roll prn Baseline: Goal status; MET 11/08/22 2.  Pt will report decreased muscle soreness in bilateral upper back x 25% Baseline:  Goal status: ONGOING  3.  Pt will be compliant with a HEP for postural strengthening/ROM Baseline:  Goal status: MET / LONG TERM GOALS: Target date: 12/20/22  Pt will be able to bring his head to neutral position for safety with ambulation/driving Baseline:  Goal status:Ongoing 2.  Pt will have decreased upper back pain by 50% or more Baseline:  Goal status:Ongoing 3.  Pt will be able to flex right shoulder to 115 degrees with minimal compensation for improved reaching ability Baseline:  Goal status: ONGOING 4; Pt will be able to maintain tandem stance on both sides x 10 sec to demonstrated improved balance. Goal status:MET right foot forward 11/08/2022, Ongoing left foot forward  5. Pt will be able to perform sit to stand x 5 without use of Ue's  Baseline:must use hands  Goal status:New  PT FREQUENCY: 2x/week  PT DURATION: 6 weeks  PLANNED INTERVENTIONS: Therapeutic exercises, Therapeutic activity, Neuromuscular re-education, Balance training, Gait training, Patient/Family education, Self Care, Joint mobilization, Aquatic Therapy, Manual therapy, and Re-evaluation  PLAN FOR NEXT SESSION: Cont land and aquatic exercises working on posture, strength, balance, STM prn for pain  Ane Payment, PTA 11/24/22 12:42 PM   [facility]  [facility] [address] [place], [place], [address] Phone: [phone]   Fax:  [phone]

## 2022-11-24 NOTE — Patient Instructions (Signed)
[facility]  Discharge Instructions: Thank you for choosing [facility] to provide your oncology and hematology care.   If you have a lab appointment with the Cancer Center, please go directly to the Cancer Center and check in at the registration area.   Wear comfortable clothing and clothing appropriate for easy access to any Portacath or PICC line.   We strive to give you quality time with your provider. You may need to reschedule your appointment if you arrive late (15 or more minutes).  Arriving late affects you and other patients whose appointments are after yours.  Also, if you miss three or more appointments without notifying the office, you may be dismissed from the clinic at the provider's discretion.      For prescription refill requests, have your pharmacy contact our office and allow 72 hours for refills to be completed.    Today you received the following chemotherapy and/or immunotherapy agents: Velcade        To help prevent nausea and vomiting after your treatment, we encourage you to take your nausea medication as directed.  BELOW ARE SYMPTOMS THAT SHOULD BE REPORTED IMMEDIATELY: *FEVER GREATER THAN 100.4 F (38 C) OR HIGHER *CHILLS OR SWEATING *NAUSEA AND VOMITING THAT IS NOT CONTROLLED WITH YOUR NAUSEA MEDICATION *UNUSUAL SHORTNESS OF BREATH *UNUSUAL BRUISING OR BLEEDING *URINARY PROBLEMS (pain or burning when urinating, or frequent urination) *BOWEL PROBLEMS (unusual diarrhea, constipation, pain near the anus) TENDERNESS IN MOUTH AND THROAT WITH OR WITHOUT PRESENCE OF ULCERS (sore throat, sores in mouth, or a toothache) UNUSUAL RASH, SWELLING OR PAIN  UNUSUAL VAGINAL DISCHARGE OR ITCHING   Items with * indicate a potential emergency and should be followed up as soon as possible or go to the Emergency Department if any problems should occur.  Please show the CHEMOTHERAPY ALERT CARD or IMMUNOTHERAPY ALERT CARD at  check-in to the Emergency Department and triage nurse.  Should you have questions after your visit or need to cancel or reschedule your appointment, please contact [facility]  Dept: [phone]  and follow the prompts.  Office hours are 8:00 a.m. to 4:30 p.m. Monday - Friday. Please note that voicemails left after 4:00 p.m. may not be returned until the following business day.  We are closed weekends and major holidays. You have access to a nurse at all times for urgent questions. Please call the main number to the clinic Dept: [phone] and follow the prompts.   For any non-urgent questions, you may also contact your provider using MyChart. We now offer e-Visits for anyone 18 and older to request care online for non-urgent symptoms. For details visit [email or web address].   Also download the MyChart app! Go to the app store, search "MyChart", open the app, select [facility], and log in with your MyChart username and password.  

## 2022-11-26 ENCOUNTER — Other Ambulatory Visit: Payer: Self-pay | Admitting: Hematology and Oncology

## 2022-11-26 DIAGNOSIS — C9000 Multiple myeloma not having achieved remission: Secondary | ICD-10-CM

## 2022-11-28 ENCOUNTER — Encounter: Payer: Self-pay | Admitting: Hematology and Oncology

## 2022-11-28 NOTE — Progress Notes (Signed)
[facility] [phone]   [phone]  PROGRESS NOTE  Patient Care Team: [name], MD as PCP - General (Internal Medicine) Pricilla Riffle, MD as PCP - Cardiology (Cardiology) Quintella Reichert, MD as PCP - Sleep Medicine (Cardiology) Lanier Prude, MD as PCP - Electrophysiology (Cardiology) [name], MD as Consulting Physician (General Surgery) Vida Rigger, MD as Consulting Physician (Gastroenterology) Quintella Reichert, MD as Consulting Physician (Sleep Medicine) Glendale Chard, DO as Consulting Physician (Neurology)  Hematological/Oncological History # IgA Kappa Multiple Myeloma 05/05/2020: Bone marrow biopsy showed increased number of plasma cells representing 14% of all cells in the aspirate associated with numerous variably sized clusters in the clot and biopsy sections 04/10/2021: M protein 0.3, IgA kappa specificity. Kappa 580.5, Labda 7.0, ratio 82.93.   12/07/2021: Labs show of 1971.3, lambda 5.5, ratio 358.42 12/10/2021: Transition care to Dr. Leonides Schanz. 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  01/26/2022: Cycle 2 Day 1 of Dara/Rev/Dex 02/22/2022: Cycle 3 Day 1 of Dara/Rev/Dex 03/23/2022: Cycle 4 Day 1 of Dara/Rev/Dex 04/20/2022: Cycle 5 Day 1 of Dara/Rev/Dex 05/18/2022: Cycle 6 Day 1 of Dara/Rev/Dex 05/27/2022: MRI cervical/thoracic/lumbar: pathologic fracture at T2 with significant osseous retropulsion and resulting cord compression. Additional smaller lesions within T1 vertebral body, right C7 and T3 articulating facets.  Small Multiple Myeloma lesions scattered at all lumbar levels and in the visible left sacral ala. Largest lumbar tumor is 2.3 cm in the left L1 posterior elements and pedicle. No lumbar extraosseous or epidural tumor extension, or pathologic fracture.Underlying chronic lumbar spine  degeneration, with a new central disc herniation at L3-L4 since 2021 now resulting in mild to moderate degenerative spinal stenosis there. 05/28/2022-06/09/2022: Admitted for T2 pathologic fracture and cord compression. He received urgent radiation therapy for a total of 3.0 Gy. He underwent  thoracic 1 through 4 posterior lateral arthrodesis/fusion and 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  06/23/2022: Cycle 1 Day 1 of Velcade/Pomalyst/Dex 08/04/2022: Cycle 2 Day 1 of Velcade/Pomalyst/Dex. Pomalyst to be decreased to 2mg  PO daily due to cytopenias.  09/01/2022: Cycle 3 Day 1 of Velcade/Pomalyst/Dex. (Missed 2nd dose of cycle due to hospitalization)  10/06/2022: Cycle 4 Day 1 of Velcade/Pomalyst/Dex.  10/27/2022: Cycle 5 Day 1 of Velcade/Pomalyst/Dex.   Interval History:  TREVEYON REPLOGLE 72 y.o. male with medical history significant for IgA Kappa Multiple Myeloma who presents for a follow up visit. He was last seen on 11/10/2022. In the interim, he continued on Velcade/Pomalyst/Dex therapy.  On exam today Mr. Vanarsdall reports that he is noticing more muscle tightness in his back and neck. He does not he's doing well with physical therapy in the pool which is helping with his pain. He is having some double vision which is going to require corrective lenses. He continues to have lower extremity edema that hasn't improved with the prescribed diuretics. He reports energy and appetite are stable. He denies nausea, vomiting or bowel habit changes.He denies fevers, chills, sweats, shortness of breath, chest pain or cough. He has no other complaints. Rest of the 10 point ROS is below.     MEDICAL HISTORY:  Past Medical History:  Diagnosis Date   A-fib [facility])    Arthritis    "comes and goes; mostly in hands, occasionally elbow" (04/05/2017)   Complication of anesthesia    "just wanted to sleep alot  for couple days  S/P VARICOCELE OR" (04/05/2017)   DVT (deep venous thrombosis) (HCC)    LLE    Dysrhythmia    PVCs    GERD (gastroesophageal reflux disease)    History of hiatal hernia    History of radiation therapy    Thoracic Spine- 07/13/22-07/23/22-Dr. Antony Blackbird   Hypertension    Impaired glucose tolerance    Multiple myeloma (HCC)    Obesity (BMI 30-39.9) 07/26/2016   OSA on CPAP 07/26/2016   Mild with AHI 12.5/hr now on CPAP at 14cm H2O   PAF (paroxysmal atrial fibrillation) (HCC)    s/p PVI Ablation in 2018 // Flecainide Rx // Echo 8/21: EF 60-65, no RWMA, mild LVH, normal RV SF, moderate RVE, mild BAE, trivial MR, mild dilation of aortic root (40 mm)   Pneumonia    "may have had walking pneumonia a few years ago" (04/05/2017)   Pre-diabetes    Thrombophlebitis     SURGICAL HISTORY: Past Surgical History:  Procedure Laterality Date   ATRIAL FIBRILLATION ABLATION  04/05/2017   ATRIAL FIBRILLATION ABLATION N/A 04/05/2017   Procedure: ATRIAL FIBRILLATION ABLATION;  Surgeon: [name], MD;  Location: [facility];  Service: Cardiovascular;  Laterality: N/A;   BONE BIOPSY Right 12/17/2021   Procedure: BONE BIOPSY;  Surgeon: [name], MD;  Location: [facility];  Service: Orthopedics;  Laterality: Right;   COMPLETE RIGHT HIP REPLACMENT  01/19/2019   EVALUATION UNDER ANESTHESIA WITH HEMORRHOIDECTOMY N/A 02/24/2017   Procedure: EXAM UNDER ANESTHESIA WITH  HEMORRHOIDECTOMY;  Surgeon: [name], MD;  Location: [facility];  Service: General;  Laterality: N/A;   EXCISIONAL TOTAL HIP ARTHROPLASTY WITH ANTIBIOTIC SPACERS Right 09/25/2020   Procedure: EXCISIONAL TOTAL HIP ARTHROPLASTY WITH ANTIBIOTIC SPACERS;  Surgeon: [name], MD;  Location: [facility];  Service: Orthopedics;  Laterality: Right;   FLEXIBLE SIGMOIDOSCOPY N/A 04/15/2015   Procedure:  UNSEDATED FLEXIBLE SIGMOIDOSCOPY;  Surgeon: [name], MD;  Location: [facility];  Service: Endoscopy;  Laterality: N/A;   HUMERUS IM NAIL Right 12/17/2021   Procedure: INTRAMEDULLARY (IM) NAIL  HUMERAL;  Surgeon: [name], MD;  Location: [facility];  Service: Orthopedics;  Laterality: Right;   KNEE ARTHROSCOPY Left ~ 2016   POSTERIOR CERVICAL FUSION/FORAMINOTOMY N/A 06/04/2022   Procedure: Thoracic one - Thoracic Four Posterior lateral arthrodesis/ fusion and Thoracic two Corpectomy;  Surgeon: [name], MD;  Location: [facility];  Service: Neurosurgery;  Laterality: N/A;   REIMPLANTATION OF TOTAL HIP Right 12/25/2020   Procedure: REIMPLANTATION/REVISION OF RIGHT TOTAL HIP VERSUS REPEAT IRRIGATION AND DEBRIDEMENT;  Surgeon: [name], MD;  Location: [facility];  Service: Orthopedics;  Laterality: Right;   TESTICLE SURGERY  1988   VARICOCELE   TONSILLECTOMY     VARICOSE VEIN SURGERY Bilateral     SOCIAL HISTORY: Social History   Socioeconomic History   Marital status: Married    Spouse name: Not on file   Number of children: 1   Years of education: law school   Highest education level: Not on file  Occupational History   Occupation: lawyer  Tobacco Use   Smoking status: Never   Smokeless tobacco: Never  Vaping Use   Vaping status: Never Used  Substance and Sexual Activity   Alcohol use: Yes    Alcohol/week: 2.0 standard drinks of alcohol    Types: 2 Glasses of wine per week    Comment: 2 glasses of wine a week, occ bourbon, beer   Drug use: No  Sexual activity: Not Currently  Other Topics Concern   Not on file  Social History Narrative   Lives in [place] with spouse in a 2 story home.  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 1 daughter.     Education: Social worker school.    Social Determinants of Health   Financial Resource Strain: Not on file  Food Insecurity: No Food Insecurity (09/27/2022)   Hunger Vital Sign    Worried About Running Out of Food in the Last Year: Never true    Ran Out of Food in the Last Year: Never true  Transportation Needs: No Transportation Needs (09/27/2022)   PRAPARE - Scientist, research (physical sciences) (Medical): No    Lack of Transportation (Non-Medical): No  Physical Activity: Not on file  Stress: Not on file  Social Connections: Unknown (09/15/2021)   Received from [facility]   Social Network    Social Network: Not on file  Intimate Partner Violence: Not At Risk (09/27/2022)   Humiliation, Afraid, Rape, and Kick questionnaire    Fear of Current or Ex-Partner: No    Emotionally Abused: No    Physically Abused: No    Sexually Abused: No    FAMILY HISTORY: Family History  Problem Relation Age of Onset   Dementia Mother    Stroke Father    Atrial fibrillation Father    Hypertension Father    Hypertension Paternal Grandfather    Dementia Maternal Grandmother     ALLERGIES:  is allergic to linezolid and vancomycin.  MEDICATIONS:  Current Outpatient Medications  Medication Sig Dispense Refill   acyclovir (ZOVIRAX) 400 MG tablet Take 1 tablet (400 mg total) by mouth 2 (two) times daily. 180 tablet 3   albuterol (VENTOLIN HFA) 108 (90 Base) MCG/ACT inhaler Inhale 2 puffs into the lungs every 6 (six) hours as needed for wheezing or shortness of breath. 8 g 0   amiodarone (PACERONE) 200 MG tablet Take 1 tablet (200 mg total) by mouth daily. Start after finishing 40 mg twice daily dose of  days (Patient taking differently: Take 200 mg by mouth daily.) 180 tablet 1   apixaban (ELIQUIS) 5 MG TABS tablet Take 1 tablet (5 mg total) by mouth 2 (two) times daily. 60 tablet 11   cholecalciferol (VITAMIN D3) 25 MCG (1000 UNIT) tablet Take 1,000 Units by mouth daily.     Cyanocobalamin (VITAMIN B-12 PO) Take 1,000 mcg by mouth daily.     dexamethasone (DECADRON) 4 MG tablet Take 10 tablets (40 mg total) by mouth every 7 (seven) days. Take 40 mg by mouth once a day in the morning of chemo on Wednesdays- or unless otherwise instructed 40 tablet 2   fluticasone (FLONASE) 50 MCG/ACT nasal spray Place 1 spray into both nostrils daily as needed for allergies or rhinitis.      gabapentin (NEURONTIN) 300 MG capsule Take 2 capsules (600 mg total) by mouth 2 (two) times daily. 360 capsule 1   hydrALAZINE (APRESOLINE) 50 MG tablet Take 1.5 tablets (75 mg total) by mouth 3 (three) times daily. 135 tablet 3   irbesartan (AVAPRO) 150 MG tablet Take 1 tablet (150 mg total) by mouth daily. 30 tablet 5   metolazone (ZAROXOLYN) 2.5 MG tablet Take 1 tablet (2.5 mg total) by mouth every other day. 45 tablet 3   metoprolol succinate (TOPROL-XL) 50 MG 24 hr tablet Take 1/2 to 1 as needed up to twice a day for AFIB and  heart rate above 100. 90 tablet 3   Omega-3 Fatty Acids (FISH OIL PO) Take 1 Capful by mouth daily.     pomalidomide (POMALYST) 2 MG capsule Take 1 capsule (2 mg total) by mouth daily. Take one tablet (2 mg by mouth) daily for 21 days, then take 7 days off [employer] auth # [id number] Date obtained 11/22/22 21 capsule 0   potassium chloride SA (KLOR-CON M) 20 MEQ tablet Take 2 tablets (40 mEq total) by mouth daily. Take an extra tablet on the day you take the Metolazone (60 meq total) (Patient taking differently: Take 40 mEq by mouth 3 (three) times daily. Take an extra tablet on the day you take the Metolazone (60 meq total)) 225 tablet 3   sodium chloride (OCEAN) 0.65 % SOLN nasal spray Place 1 spray into both nostrils as needed for congestion.     torsemide (DEMADEX) 20 MG tablet Take 1 tablet today, then tomorrow take 2 tablets by mouth for 2 days then decrease to 1 tablet daily. May take an extra tablet for weight gain of 3lb or more overnight or 5lb in a week. 180 tablet 3   No current facility-administered medications for this visit.    REVIEW OF SYSTEMS:   Constitutional: ( - ) fevers, ( - )  chills , ( - ) night sweats Eyes: ( - ) blurriness of vision, ( - ) double vision, ( - ) watery eyes Ears, nose, mouth, throat, and face: ( - ) mucositis, ( - ) sore throat Respiratory: ( - ) cough, ( - ) dyspnea, ( - ) wheezes Cardiovascular: ( - ) palpitation, ( - ) chest  discomfort, ( - ) lower extremity swelling Gastrointestinal:  ( - ) nausea, ( - ) heartburn, ( - ) change in bowel habits Skin: ( - ) abnormal skin rashes Lymphatics: ( - ) new lymphadenopathy, ( - ) easy bruising Neurological: ( - ) numbness, ( - ) tingling, ( - ) new weaknesses Behavioral/Psych: ( - ) mood change, ( - ) new changes  All other systems were reviewed with the patient and are negative.  PHYSICAL EXAMINATION: ECOG PERFORMANCE STATUS: 2 - Symptomatic, <50% confined to bed  Vitals:   11/24/22 1415  BP: (!) 152/66  Pulse: (!) 59  Resp: 18  Temp: 98.1 F (36.7 C)  SpO2: 94%        Filed Weights   11/24/22 1415  Weight: 259 lb 8 oz (117.7 kg)    GENERAL: Well-appearing elderly Caucasian male, alert, no distress and comfortable SKIN: no overt abnormalities of the skin.  EYES: conjunctiva are pink and non-injected, sclera clear LUNGS: clear to auscultation and percussion with normal breathing effort HEART: tachycardic &  no murmurs. +bilateral lower extremity edema, left greater than right Musculoskeletal: no cyanosis of digits and no clubbing  PSYCH: alert & oriented x 3, fluent speech NEURO: no focal motor/sensory deficits  LABORATORY DATA:  I have reviewed the data as listed    Latest Ref Rng & Units 11/24/2022    1:31 PM 11/17/2022    2:08 PM 11/10/2022   10:15 AM  CBC  WBC 4.0 - 10.5 K/uL 9.6  7.8  8.4   Hemoglobin 13.0 - 17.0 g/dL 62.9  52.8  41.3   Hematocrit 39.0 - 52.0 % 35.3  31.8  33.4   Platelets 150 - 400 K/uL 184  161  182        Latest Ref Rng & Units 11/24/2022  1:31 PM 11/17/2022    2:08 PM 11/10/2022   10:15 AM  CMP  Glucose 70 - 99 mg/dL 474  259  93   BUN 8 - 23 mg/dL 27  27  31    Creatinine 0.61 - 1.24 mg/dL 5.63  8.75  6.43   Sodium 135 - 145 mmol/L 144  142  143   Potassium 3.5 - 5.1 mmol/L 4.1  3.1  3.7   Chloride 98 - 111 mmol/L 110  102  106   CO2 22 - 32 mmol/L 27  33  32   Calcium 8.9 - 10.3 mg/dL 9.1  8.8  9.1   Total  Protein 6.5 - 8.1 g/dL 6.1  5.9  5.9   Total Bilirubin 0.3 - 1.2 mg/dL 0.5  0.4  0.3   Alkaline Phos 38 - 126 U/L 92  97  78   AST 15 - 41 U/L 20  27  11    ALT 0 - 44 U/L 30  31  19      Lab Results  Component Value Date   MPROTEIN Not Observed 11/03/2022   MPROTEIN Not Observed 10/27/2022   MPROTEIN Not Observed 10/06/2022   Lab Results  Component Value Date   KPAFRELGTCHN 44.5 (H) 11/03/2022   KPAFRELGTCHN 41.8 (H) 10/27/2022   KPAFRELGTCHN 49.0 (H) 10/06/2022   LAMBDASER 13.5 11/03/2022   LAMBDASER 15.2 10/27/2022   LAMBDASER 12.0 10/06/2022   KAPLAMBRATIO 3.30 (H) 11/03/2022   KAPLAMBRATIO 2.75 (H) 10/27/2022   KAPLAMBRATIO 4.08 (H) 10/06/2022     RADIOGRAPHIC STUDIES: No results found.  ASSESSMENT & PLAN Travis [name] is a 72 y.o. male with medical history significant for IgA Kappa Multiple Myeloma who presents for a follow up visit.   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  He meets diagnostic criteria by having a serum free light chain ratio greater than 100 and lytic lesions causing pathological fracture of the humerus.  Given this I would recommend we pursued Darzalex, Revlimid, and dexamethasone.  Given his advanced age I would not recommend transplantation, that this is something we can consider when it comes time to transition to maintenance therapy versus transplant.  The patient voices understanding of the plan moving forward.  # IgA Kappa Multiple Myeloma --Metastatic survey completed 12/15/2021 --Patient underwent fixation of his humerus on 12/17/2021, pathology confirmed plasmacytoma/plasma cell neoplasm.  --Received 6 cycles of Darzalex, Revlimid, and dexamethasone started on 12/28/2021-05/18/2022.  --Due to worsening myeloma osseous lesions including pathologic fracture at T2 with cord compression, Dr. Leonides Schanz recommends changing therapy to  Velcade/Pomalyst/Dex.  Plan: --Due for Cycle 5, Day 22 of Velcade/Pom/Dex today --labs from today showed white blood cell 9.6, Hgb 11.1, MCV 95.1, Plt 184 --restaging labs from 11/03/2022 with SPEP and SFLC showed M protein not detectable. Kappa increased to 44.5 (from 24.2), Lambda 13.5, Ratio 3.30 --Pomalyst was dose reduced to 2 mg p.o. due to cytopenias. Recommend to continue --RTC in 2 weeks with interval weekly treatments.   #Double vision: --Discussed with Dr. Leonides Schanz who recommend to monitor and continue with Velcade therapy.  --Proceed with corrective lenses as prescribed by opthalmologist.   #Neck pain and mid back pain: --Suspect pain is exacerbated from recent hospitalization and being in bed/sitting for long periods.  --MRI C/T/L spine obtinaed that showed unchanged lytic lesions in the right C7 inferior articular process,T6, T8, and T10  vertebral bodies, as well  as  the T11 spinous process. Unchanged  multilevel cervical spondylsis, Interval enlargement of disc herniation at L3-L4 with progressive spinal canal stenosis. No new or progressive lytic lesions.  --Encouraged to continue with physical therapy and follow up with ortho.   #Palliative Radiation Therapy: # Dysphagia --Received palliative radiation from 01/06/2022-01/19/2022 to right hymerus, right forearm and right chest/rib, 20 Gy in 10 Fx.  --recieved palliative radiation to surgical bed in the upper thoracic spine area on 07/13/2022.  27 Gy in 9 Fx.   #Lower extremity edema: --Currently on lasix and torsemide with minimal improvement. Advised to follow up with cardiologist to discuss dose or medication changes.   # Rash Concerning for Shingles-resolved -- Prescribed valacyclovir 3 times daily 1000 mg x 10 days-completed the course -- We will continue to monitor rash closely.  #DVT: --Found to have acute DVT involving left peroneal veins. Likely secondary to recent surgery and hospitalization --Currently on eliquis 5  mg BID.    #Hypokalemia: --Currently on potassium chloride 40 mEq per day as prescribed but takes three times a day (60 mEq) on the days he takes Metolazine.  --Potassium level is 4.1today. No further intervention needed.   #Rash on scalp and right forearm: --Recommend topical hydrocortisone cream for spot treatment  #Supportive Care -- chemotherapy education complete -- port placement not required -- zofran 8mg  q8H PRN and compazine 10mg  PO q6H for nausea -- acyclovir 400mg  PO BID for VCZ prophylaxis -- Started Zometa q 12 weeks on 08/18/2022. Next dose July 2024.   No orders of the defined types were placed in this encounter.  All questions were answered. The patient knows to call the clinic with any problems, questions or concerns.  A total of more than 30 minutes were spent on this encounter with face-to-face time and non-face-to-face time, including preparing to see the patient, ordering tests and/or medications, counseling the patient and coordination of care as outlined above.   Georga Kaufmann PA-C Dept of Hematology and Oncology [facility] Phone: [phone]   11/28/2022 9:40 AM

## 2022-11-29 ENCOUNTER — Ambulatory Visit: Payer: Medicare Other

## 2022-11-29 DIAGNOSIS — R293 Abnormal posture: Secondary | ICD-10-CM | POA: Diagnosis not present

## 2022-11-29 DIAGNOSIS — M542 Cervicalgia: Secondary | ICD-10-CM

## 2022-11-29 DIAGNOSIS — C9000 Multiple myeloma not having achieved remission: Secondary | ICD-10-CM

## 2022-11-29 DIAGNOSIS — S22000A Wedge compression fracture of unspecified thoracic vertebra, initial encounter for closed fracture: Secondary | ICD-10-CM

## 2022-11-29 DIAGNOSIS — M6281 Muscle weakness (generalized): Secondary | ICD-10-CM

## 2022-11-29 DIAGNOSIS — S22000D Wedge compression fracture of unspecified thoracic vertebra, subsequent encounter for fracture with routine healing: Secondary | ICD-10-CM | POA: Diagnosis not present

## 2022-11-29 NOTE — Therapy (Signed)
OUTPATIENT PHYSICAL THERAPY ONCOLOGY TREATMENT  Patient Name: [name] MRN: [id number] DOB:[birth year], 72 y.o., male Today's Date: 11/29/2022   END OF SESSION:  PT End of Session - 11/29/22 1103     Visit Number 17    Number of Visits 23    Date for PT Re-Evaluation 12/20/22    PT Start Time 1103    PT Stop Time 1158    PT Time Calculation (min) 55 min    Activity Tolerance Patient tolerated treatment well    Behavior During Therapy WFL for tasks assessed/performed                Past Medical History:  Diagnosis Date   A-fib (HCC)    Arthritis    "comes and goes; mostly in hands, occasionally elbow" (04/05/2017)   Complication of anesthesia    "just wanted to sleep alot  for couple days S/P VARICOCELE OR" (04/05/2017)   DVT (deep venous thrombosis) (HCC)    LLE   Dysrhythmia    PVCs    GERD (gastroesophageal reflux disease)    History of hiatal hernia    History of radiation therapy    Thoracic Spine- 07/13/22-07/23/22-Dr. Antony Blackbird   Hypertension    Impaired glucose tolerance    Multiple myeloma (HCC)    Obesity (BMI 30-39.9) 07/26/2016   OSA on CPAP 07/26/2016   Mild with AHI 12.5/hr now on CPAP at 14cm H2O   PAF (paroxysmal atrial fibrillation) (HCC)    s/p PVI Ablation in 2018 // Flecainide Rx // Echo 8/21: EF 60-65, no RWMA, mild LVH, normal RV SF, moderate RVE, mild BAE, trivial MR, mild dilation of aortic root (40 mm)   Pneumonia    "may have had walking pneumonia a few years ago" (04/05/2017)   Pre-diabetes    Thrombophlebitis    Past Surgical History:  Procedure Laterality Date   ATRIAL FIBRILLATION ABLATION  04/05/2017   ATRIAL FIBRILLATION ABLATION N/A 04/05/2017   Procedure: ATRIAL FIBRILLATION ABLATION;  Surgeon: [name], MD;  Location: [facility];  Service: Cardiovascular;  Laterality: N/A;   BONE BIOPSY Right 12/17/2021   Procedure: BONE BIOPSY;  Surgeon: [name], MD;  Location: [facility];   Service: Orthopedics;  Laterality: Right;   COMPLETE RIGHT HIP REPLACMENT  01/19/2019   EVALUATION UNDER ANESTHESIA WITH HEMORRHOIDECTOMY N/A 02/24/2017   Procedure: EXAM UNDER ANESTHESIA WITH  HEMORRHOIDECTOMY;  Surgeon: [name], MD;  Location: [facility];  Service: General;  Laterality: N/A;   EXCISIONAL TOTAL HIP ARTHROPLASTY WITH ANTIBIOTIC SPACERS Right 09/25/2020   Procedure: EXCISIONAL TOTAL HIP ARTHROPLASTY WITH ANTIBIOTIC SPACERS;  Surgeon: [name], MD;  Location: [facility];  Service: Orthopedics;  Laterality: Right;   FLEXIBLE SIGMOIDOSCOPY N/A 04/15/2015   Procedure:  UNSEDATED FLEXIBLE SIGMOIDOSCOPY;  Surgeon: [name], MD;  Location: [facility];  Service: Endoscopy;  Laterality: N/A;   HUMERUS IM NAIL Right 12/17/2021   Procedure: INTRAMEDULLARY (IM) NAIL HUMERAL;  Surgeon: [name], MD;  Location: [facility];  Service: Orthopedics;  Laterality: Right;   KNEE ARTHROSCOPY Left ~ 2016   POSTERIOR CERVICAL FUSION/FORAMINOTOMY N/A 06/04/2022   Procedure: Thoracic one - Thoracic Four Posterior lateral arthrodesis/ fusion and Thoracic two Corpectomy;  Surgeon: [name], MD;  Location: [facility];  Service: Neurosurgery;  Laterality: N/A;   REIMPLANTATION OF TOTAL HIP Right 12/25/2020   Procedure: REIMPLANTATION/REVISION OF RIGHT TOTAL HIP VERSUS REPEAT IRRIGATION AND DEBRIDEMENT;  Surgeon: [name], MD;  Location:  [facility];  Service: Orthopedics;  Laterality: Right;   TESTICLE SURGERY  1988   VARICOCELE   TONSILLECTOMY     VARICOSE VEIN SURGERY Bilateral    Patient Active Problem List   Diagnosis Date Noted   Pancreatic cyst 09/30/2022   Infection due to novel influenza A virus 09/29/2022   Persistent atrial fibrillation (HCC) 07/15/2022   Acute on chronic diastolic heart failure (HCC) 07/03/2022   Pulmonary infiltrates 07/01/2022   Acute respiratory failure with hypoxia (HCC) 07/01/2022   Hyponatremia 06/29/2022   History of thoracic surgery  06/29/2022   Acute DVT of left tibial vein (HCC) 06/21/2022   Hypokalemia 06/21/2022   Vitamin D deficiency 05/30/2022   Other constipation 05/29/2022   Pathologic compression fracture of spine, initial encounter (HCC) 05/29/2022   Cord compression (HCC) 05/28/2022   Degenerative cervical disc 05/14/2022   Hypercoagulable state due to persistent atrial fibrillation (HCC) 01/11/2022   Multiple myeloma (HCC) 12/10/2021   Medication monitoring encounter 02/27/2021   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   Status post peripherally inserted central catheter (PICC) central line placement 11/06/2020   Drug rash with eosinophilia and systemic symptoms syndrome 11/06/2020   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   PAF (paroxysmal atrial fibrillation) (HCC)    GERD (gastroesophageal reflux disease) 03/29/2018   Hiatal hernia 03/29/2018   History of DVT (deep vein thrombosis) 03/29/2018   CAP (community acquired pneumonia) 03/29/2018   Osteoarthritis 03/29/2018   Neuropathy 08/29/2017   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   Paroxysmal atrial fibrillation with rapid ventricular response (HCC) 04/05/2017   Prolapsed internal hemorrhoids, grade 4, s/p ligation/pexy/hemorrhoidectomy x 2 02/24/2017 02/24/2017   OSA (obstructive sleep apnea) 07/26/2016   Obesity (BMI 30-39.9) 07/26/2016   Atrial fibrillation with RVR (HCC)    Essential hypertension    Chest pain at rest     PCP: Eleanora Neighbor, MD  REFERRING PROVIDER: Antony Blackbird, MD  REFERRING DIAG: Multiple Myeloma  THERAPY DIAG:  Compression fracture of thoracic vertebra, unspecified thoracic vertebral level, initial encounter (HCC)  Multiple myeloma not having achieved remission (HCC)  Muscle weakness (generalized)  Abnormal posture  Neck pain  ONSET DATE: 2023  Rationale for Evaluation and Treatment: Rehabilitation  SUBJECTIVE:                                                                                                                                                                                            SUBJECTIVE STATEMENT: Down 10# from Sat-Sun. I was able to pressure wash my front porch yesterday and I felt good doing that and wasn't fatigued after. I also had  an A-Fib episode that night. I had a scan when I saw my neurologist 2 weeks ago and he said my T-Spine fractured is healed and everything is looking really good.   PERTINENT HISTORY:  Travis [name] 72 y.o. male with medical history significant for IgA Kappa Multiple Myeloma. He was diagnosed with smouldering myeloma in 2018 . Last July he developed right arm pain due to a pathologic fx and he underwent radiation and fixation. In early Feb 2024 he had radiation for pathologic T2 compression fx followed by a T1-T4 fusion. PAIN:  Are you having pain? No, same neck tightness and soreness at between shoulder blades but not pain today.    PRECAUTIONS: Multiple Myeloma with mets to R ribs, R humerus with ORIF for pathological fracture 12/17/21. Lesions have also been found in R rib and also in his skull. Pathologic fx T2 with fusion T1-4 on 06/04/22, Bilateral LE edema (on Lasix), Afib   WEIGHT BEARING RESTRICTIONS: No  FALLS:  Has patient fallen in last 6 months? Yes. Number of falls 1  LIVING ENVIRONMENT: Lives with: lives with their family and lives with their spouse Lives in: House/apartment Stairs: Yes; Internal: 14 steps; on left going up and External: 3 steps; on right going up Has following equipment at home: Single point cane and Walker - 4 wheeled  OCCUPATION: attorney/ mostly retired?  LEISURE: golf (can't do now.), use to play racquetball  HAND DOMINANCE: right   PRIOR LEVEL OF FUNCTION: Independent  POSTURE:significant slump sitting posture with forward head  PATIENT GOALS: decrease pain,get stronger,improve balance   OBJECTIVE:  COGNITION: Overall cognitive status: Within functional limits for  tasks assessed   PALPATION: Very tender bilateral UT/levator, scapular regions  OBSERVATIONS / OTHER ASSESSMENTS: very flexed head and neck with chin nearly at chest  SENSATION: Light touch: Appears intact    POSTURE: significant forward head, round shoulders, increased kyphosis   Gait holding single point cane in left hand but not using. Right shoulder low, flexed neck;difficulty raising head to see where he is going  UPPER EXTREMITY AROM/PROM:  A/PROM RIGHT   eval  RIGHT 11/08/2022  Shoulder extension    Shoulder flexion 91 with compensation 101 with compensation   Shoulder abduction 67 with compensation 75 with compensation  Shoulder internal rotation    Shoulder external rotation C6     (Blank rows = not tested)  A/PROM LEFT   eval  Shoulder extension   Shoulder flexion 123  Shoulder abduction 112  Shoulder internal rotation T7  Shoulder external rotation t3    (Blank rows = not tested)  CERVICAL AROM: All within normal limits:    Percent limited 11/08/22  Flexion Fully flexed   Extension From flexed position decreased 80% Decreased 70%  Right lateral flexion    Left lateral flexion    Right rotation Dec 50% sitting in FH posture   Left rotation        Dec 50%     UPPER EXTREMITY STRENGTH:    LOWER EXTREMITY AROM/PROM:  A/PROM Right eval  Hip flexion   Hip extension   Hip abduction   Hip adduction   Hip internal rotation   Hip external rotation   Knee flexion   Knee extension   Ankle dorsiflexion   Ankle plantarflexion   Ankle inversion   Ankle eversion    (Blank rows = not tested)  A/PROM LEFT eval  Hip flexion   Hip extension   Hip abduction   Hip adduction  Hip internal rotation   Hip external rotation   Knee flexion   Knee extension   Ankle dorsiflexion   Ankle plantarflexion   Ankle inversion   Ankle eversion    (Blank rows = not tested)  LOWER EXTREMITY MMT:    10/04/2022  4 position balance test; able to hold DLS feet  together , and half tandem stance 10 seconds. Unable to maintain full tandem stance for 10 sec, unable to hold SLS greater than a second or 2 on left, and 4 seconds on right.     LYMPHEDEMA ASSESSMENTS:   SURGERY TYPE/DATE: 8/17/23Left prox humerus fixation for pathologic fx  2/2/2024Fusion T1-T4 for pathologic fx with cord compression 06/04/22 posterior cervical fusion/foraminotomy;T1-4 fusion NUMBER OF LYMPH NODES REMOVED: NA  CHEMOTHERAPY: Velcade/promalyst/Dex;  RADIATION:to prox humerus and thoracic spine pathologic fx  HORMONE TREATMENT: NA  INFECTIONS: NA  FUNCTIONAL TESTS:  10/09/2022;30 second sit to stand 10 with heavy use of arms  GAIT: Distance walked: 20 Assistive device utilized: carried SPC in left Level of assistance: Complete Independence Comments: feels unsteady, very flexed neck posture   QUICK DASH SURVEY:    TODAY'S TREATMENT:                                                                                                                                         DATE:  11/29/22: Therapeutic Exercises and Neuro Re Ed In // bars: 6" step ups x 10 each; after SpO2 97%, HR 71 bpm  Nu step seat 12, UE 8, lev 4 x 8 min, 710 steps, 97% O2 , HR 90-95 bpm In // bars for following: Mini Squats 2 x 10; slow, controlled high knee marching x 3 length of bars; standing on blue oval for 3 way hip raises x 10 each returning therapist demo for each and tactile cues to keep Rt knee extended with ext; short seated rest to check vitals SpO2 98% and HR 90-99 BPM after Green theraband (red was too easy) for bil scap retract x 20 then bil UE ext x 20, seated rest after   Wall Push Ups 2 x 10, pt reports liking these and plans to add them to his HEP Manual Therapy STM seated in chair with cocoa butter to bil UT/upper scapular regions, and cervical and thoracic paraspinals where pt palpably very tight.   11/24/22:Pt seen for aquatic therapy today.  Treatment took place in water  3.5-4.75 ft in depth at the [facility] pool. Temp of water was 90.  Pt entered/exited the pool via stairs with bilat rail independently. Pt requires buoyancy of water for support and to offload joints with strengthening exercises.  Seated water bench with 75% submersion Pt performed seated LE AROM exercises 20x in all planes, with concurrent discussion of his current health status and pain assessment. 75% depth water walking with single buoy UE wts for push/pull 8 lengths. 3 way  stance: parallel, tandem both sides with hand paddles: 10x2 bil arms forward & backwards. Hip 3 way kicks 10x Bil holding onto wall for balance.Bil heel raises 15x no UE support needed in water. 10 min horizontal float with concurrent skin rolling on upper back.    11/22/2022 HR 54, O2 sats 96% at  rest Nu step seat 12, UE 8, lev 4 x 8 min, 595 steps, O2 98, HR 53 6 in steps 2 x 5 ea Mini squats 3 x 5  seated rest  Ambulated 255 feet, 02 96, HR ,68 seated rest Incline stretch x 3 Step and hold on ax forward and sideways x 10 ea seated rest Sit to stand 2 x 5  , O2 93 up to 95, HR 52-56, seated rest STM to bil UT/upper scap region and cervical muscles and bilateral paraspinal muscles in prop sitting with cocoa butter  11/19/2022 Nu step seat 12, UE 8, lev 5 x 8 min Heel raises x 10, marching x 10 B, No HH Partial step ups 6 " 2 x 10, seated rest Forward and sideways step ups on ax x 10 B Vector reaches x5 B Sidestepping 4 laps in bars, seated rest STM to bil UT/upper scap region and cervical muscles and bilateral paraspinal muscles in prop sitting with cocoa butter  PATIENT EDUCATION:  Education details: sitting posture with lumbar roll Person educated: Patient Education method: Medical illustrator Education comprehension: verbalized understanding and returned demonstration  HOME EXERCISE PROGRAM: Doing HEP from HHPT; cervical ROM, shoulder rolls, shrugs, scap retraction, UT  stretches  Aquatic Access Code: [email or web address] URL: [email or web address] Date: 11/09/2022 Prepared by: [facility] [address] This aquatic home exercise program from MedBridge utilizes pictures from land based exercises, but has been adapted prior to lamination and issuance.    ASSESSMENT:  CLINICAL IMPRESSION: Pt demonstrated good improvement since last land therapy session. He required very little rest breaks today and was able to tolerate prolonged standing and balance activities. He feels he is getting stronger as well at home as he was able to pressure wash  his front porch with no increased fatigue after. He also reports his neck pain is improving, just still very tight at neck and between scapulae.  OBJECTIVE IMPAIRMENTS: decreased activity tolerance, decreased balance, difficulty walking, decreased ROM, decreased strength, increased edema, impaired UE functional use, postural dysfunction, and pain.   ACTIVITY LIMITATIONS: carrying, lifting, dressing, reach over head, and locomotion level  PARTICIPATION LIMITATIONS: cleaning, laundry, driving, and occupation  PERSONAL FACTORS:  Multiple Myeloma  are also affecting patient's functional outcome.   REHAB POTENTIAL: Good  CLINICAL DECISION MAKING: Evolving/moderate complexity  EVALUATION COMPLEXITY: Moderate  GOALS: Goals reviewed with patient? Yes  SHORT TERM GOALS: Target date: 10/21/22  Pt will be educated in and independent in proper sitting posture using lumbar roll prn Baseline: Goal status; MET 11/08/22 2.  Pt will report decreased muscle soreness in bilateral upper back x 25% Baseline:  Goal status: ONGOING  3.  Pt will be compliant with a HEP for postural strengthening/ROM Baseline:  Goal status: MET / LONG TERM GOALS: Target date: 12/20/22  Pt will be able to bring his head to neutral position for safety with ambulation/driving Baseline:  Goal status:Ongoing 2.  Pt will have  decreased upper back pain by 50% or more Baseline:  Goal status:Ongoing 3.  Pt will be able to flex right shoulder to 115 degrees with minimal compensation for improved reaching ability Baseline:  Goal status: ONGOING  4; Pt will be able to maintain tandem stance on both sides x 10 sec to demonstrated improved balance. Goal status:MET right foot forward 11/08/2022, Ongoing left foot forward  5. Pt will be able to perform sit to stand x 5 without use of Ue's  Baseline:must use hands  Goal status:New  PT FREQUENCY: 2x/week  PT DURATION: 6 weeks  PLANNED INTERVENTIONS: Therapeutic exercises, Therapeutic activity, Neuromuscular re-education, Balance training, Gait training, Patient/Family education, Self Care, Joint mobilization, Aquatic Therapy, Manual therapy, and Re-evaluation  PLAN FOR NEXT SESSION: Cont land and aquatic exercises working on posture, strength, balance, STM prn for pain  Berna Spare, PTA 11/29/22 12:06 PM

## 2022-12-02 ENCOUNTER — Other Ambulatory Visit: Payer: Self-pay

## 2022-12-02 ENCOUNTER — Inpatient Hospital Stay: Payer: Medicare Other

## 2022-12-02 ENCOUNTER — Inpatient Hospital Stay: Payer: Medicare Other | Attending: Hematology and Oncology | Admitting: Hematology and Oncology

## 2022-12-02 DIAGNOSIS — M7989 Other specified soft tissue disorders: Secondary | ICD-10-CM | POA: Insufficient documentation

## 2022-12-02 DIAGNOSIS — Z7901 Long term (current) use of anticoagulants: Secondary | ICD-10-CM | POA: Insufficient documentation

## 2022-12-02 DIAGNOSIS — Z79899 Other long term (current) drug therapy: Secondary | ICD-10-CM | POA: Insufficient documentation

## 2022-12-02 DIAGNOSIS — M533 Sacrococcygeal disorders, not elsewhere classified: Secondary | ICD-10-CM | POA: Insufficient documentation

## 2022-12-02 DIAGNOSIS — C9000 Multiple myeloma not having achieved remission: Secondary | ICD-10-CM

## 2022-12-02 DIAGNOSIS — I480 Paroxysmal atrial fibrillation: Secondary | ICD-10-CM | POA: Diagnosis not present

## 2022-12-02 DIAGNOSIS — I10 Essential (primary) hypertension: Secondary | ICD-10-CM | POA: Insufficient documentation

## 2022-12-02 DIAGNOSIS — Z5112 Encounter for antineoplastic immunotherapy: Secondary | ICD-10-CM | POA: Diagnosis present

## 2022-12-02 DIAGNOSIS — Z8249 Family history of ischemic heart disease and other diseases of the circulatory system: Secondary | ICD-10-CM | POA: Insufficient documentation

## 2022-12-02 DIAGNOSIS — Z7952 Long term (current) use of systemic steroids: Secondary | ICD-10-CM | POA: Insufficient documentation

## 2022-12-02 DIAGNOSIS — Z923 Personal history of irradiation: Secondary | ICD-10-CM | POA: Insufficient documentation

## 2022-12-02 DIAGNOSIS — I5033 Acute on chronic diastolic (congestive) heart failure: Secondary | ICD-10-CM | POA: Insufficient documentation

## 2022-12-02 DIAGNOSIS — Z823 Family history of stroke: Secondary | ICD-10-CM | POA: Insufficient documentation

## 2022-12-02 DIAGNOSIS — R1310 Dysphagia, unspecified: Secondary | ICD-10-CM | POA: Insufficient documentation

## 2022-12-02 DIAGNOSIS — G4733 Obstructive sleep apnea (adult) (pediatric): Secondary | ICD-10-CM | POA: Insufficient documentation

## 2022-12-02 DIAGNOSIS — E876 Hypokalemia: Secondary | ICD-10-CM | POA: Insufficient documentation

## 2022-12-02 DIAGNOSIS — Z881 Allergy status to other antibiotic agents status: Secondary | ICD-10-CM | POA: Diagnosis not present

## 2022-12-02 DIAGNOSIS — Z7961 Long term (current) use of immunomodulator: Secondary | ICD-10-CM | POA: Diagnosis not present

## 2022-12-02 DIAGNOSIS — M5126 Other intervertebral disc displacement, lumbar region: Secondary | ICD-10-CM | POA: Diagnosis not present

## 2022-12-02 DIAGNOSIS — M48061 Spinal stenosis, lumbar region without neurogenic claudication: Secondary | ICD-10-CM | POA: Insufficient documentation

## 2022-12-02 DIAGNOSIS — Z86718 Personal history of other venous thrombosis and embolism: Secondary | ICD-10-CM | POA: Insufficient documentation

## 2022-12-02 DIAGNOSIS — Z9089 Acquired absence of other organs: Secondary | ICD-10-CM | POA: Insufficient documentation

## 2022-12-02 LAB — CMP (CANCER CENTER ONLY)
ALT: 19 U/L (ref 0–44)
AST: 12 U/L — ABNORMAL LOW (ref 15–41)
Albumin: 3.8 g/dL (ref 3.5–5.0)
Alkaline Phosphatase: 76 U/L (ref 38–126)
Anion gap: 9 (ref 5–15)
BUN: 25 mg/dL — ABNORMAL HIGH (ref 8–23)
CO2: 26 mmol/L (ref 22–32)
Calcium: 8.4 mg/dL — ABNORMAL LOW (ref 8.9–10.3)
Chloride: 109 mmol/L (ref 98–111)
Creatinine: 0.9 mg/dL (ref 0.61–1.24)
GFR, Estimated: >60 mL/min
Glucose, Bld: 97 mg/dL (ref 70–99)
Potassium: 3.7 mmol/L (ref 3.5–5.1)
Sodium: 144 mmol/L (ref 135–145)
Total Bilirubin: 0.4 mg/dL (ref 0.3–1.2)
Total Protein: 5.8 g/dL — ABNORMAL LOW (ref 6.5–8.1)

## 2022-12-02 LAB — CBC WITH DIFFERENTIAL (CANCER CENTER ONLY)
Abs Immature Granulocytes: 0.02 10*3/uL (ref 0.00–0.07)
Basophils Absolute: 0.1 10*3/uL (ref 0.0–0.1)
Basophils Relative: 1 %
Eosinophils Absolute: 0.3 10*3/uL (ref 0.0–0.5)
Eosinophils Relative: 6 %
HCT: 33.1 % — ABNORMAL LOW (ref 39.0–52.0)
Hemoglobin: 10.6 g/dL — ABNORMAL LOW (ref 13.0–17.0)
Immature Granulocytes: 0 %
Lymphocytes Relative: 11 %
Lymphs Abs: 0.6 10*3/uL — ABNORMAL LOW (ref 0.7–4.0)
MCH: 30.1 pg (ref 26.0–34.0)
MCHC: 32 g/dL (ref 30.0–36.0)
MCV: 94 fL (ref 80.0–100.0)
Monocytes Absolute: 1.3 10*3/uL — ABNORMAL HIGH (ref 0.1–1.0)
Monocytes Relative: 24 %
Neutro Abs: 3.1 10*3/uL (ref 1.7–7.7)
Neutrophils Relative %: 58 %
Platelet Count: 153 10*3/uL (ref 150–400)
RBC: 3.52 MIL/uL — ABNORMAL LOW (ref 4.22–5.81)
RDW: 18 % — ABNORMAL HIGH (ref 11.5–15.5)
WBC Count: 5.4 10*3/uL (ref 4.0–10.5)
nRBC: 0 % (ref 0.0–0.2)

## 2022-12-02 MED ORDER — BORTEZOMIB CHEMO SQ INJECTION 3.5 MG (2.5MG/ML)
1.3000 mg/m2 | Freq: Once | INTRAMUSCULAR | Status: AC
  Administered 2022-12-02: 3 mg via SUBCUTANEOUS
  Filled 2022-12-02: qty 1.2

## 2022-12-02 NOTE — Patient Instructions (Signed)
[facility]  Discharge Instructions: Thank you for choosing [facility] to provide your oncology and hematology care.   If you have a lab appointment with the Cancer Center, please go directly to the Cancer Center and check in at the registration area.   Wear comfortable clothing and clothing appropriate for easy access to any Portacath or PICC line.   We strive to give you quality time with your provider. You may need to reschedule your appointment if you arrive late (15 or more minutes).  Arriving late affects you and other patients whose appointments are after yours.  Also, if you miss three or more appointments without notifying the office, you may be dismissed from the clinic at the provider's discretion.      For prescription refill requests, have your pharmacy contact our office and allow 72 hours for refills to be completed.    Today you received the following chemotherapy and/or immunotherapy agents: Velcade        To help prevent nausea and vomiting after your treatment, we encourage you to take your nausea medication as directed.  BELOW ARE SYMPTOMS THAT SHOULD BE REPORTED IMMEDIATELY: *FEVER GREATER THAN 100.4 F (38 C) OR HIGHER *CHILLS OR SWEATING *NAUSEA AND VOMITING THAT IS NOT CONTROLLED WITH YOUR NAUSEA MEDICATION *UNUSUAL SHORTNESS OF BREATH *UNUSUAL BRUISING OR BLEEDING *URINARY PROBLEMS (pain or burning when urinating, or frequent urination) *BOWEL PROBLEMS (unusual diarrhea, constipation, pain near the anus) TENDERNESS IN MOUTH AND THROAT WITH OR WITHOUT PRESENCE OF ULCERS (sore throat, sores in mouth, or a toothache) UNUSUAL RASH, SWELLING OR PAIN  UNUSUAL VAGINAL DISCHARGE OR ITCHING   Items with * indicate a potential emergency and should be followed up as soon as possible or go to the Emergency Department if any problems should occur.  Please show the CHEMOTHERAPY ALERT CARD or IMMUNOTHERAPY ALERT CARD at  check-in to the Emergency Department and triage nurse.  Should you have questions after your visit or need to cancel or reschedule your appointment, please contact [facility]  Dept: [phone]  and follow the prompts.  Office hours are 8:00 a.m. to 4:30 p.m. Monday - Friday. Please note that voicemails left after 4:00 p.m. may not be returned until the following business day.  We are closed weekends and major holidays. You have access to a nurse at all times for urgent questions. Please call the main number to the clinic Dept: [phone] and follow the prompts.   For any non-urgent questions, you may also contact your provider using MyChart. We now offer e-Visits for anyone 18 and older to request care online for non-urgent symptoms. For details visit [email or web address].   Also download the MyChart app! Go to the app store, search "MyChart", open the app, select [facility], and log in with your MyChart username and password.  

## 2022-12-02 NOTE — Therapy (Signed)
OUTPATIENT PHYSICAL THERAPY ONCOLOGY TREATMENT  Patient Name: Travis [name] MRN: [id number] DOB:[birth year], 72 y.o., male Today's Date: 12/03/2022   END OF SESSION:  PT End of Session - 12/03/22 1201     Visit Number 18    Number of Visits 23    Date for PT Re-Evaluation 12/20/22    PT Start Time 1202    PT Stop Time 1247    PT Time Calculation (min) 45 min    Activity Tolerance Patient tolerated treatment well    Behavior During Therapy [facility] for tasks assessed/performed                 Past Medical History:  Diagnosis Date   A-fib [facility])    Arthritis    "comes and goes; mostly in hands, occasionally elbow" (04/05/2017)   Complication of anesthesia    "just wanted to sleep alot  for couple days S/P VARICOCELE OR" (04/05/2017)   DVT (deep venous thrombosis) (HCC)    LLE   Dysrhythmia    PVCs    GERD (gastroesophageal reflux disease)    History of hiatal hernia    History of radiation therapy    Thoracic Spine- 07/13/22-07/23/22-Dr. Antony Blackbird   Hypertension    Impaired glucose tolerance    Multiple myeloma (HCC)    Obesity (BMI 30-39.9) 07/26/2016   OSA on CPAP 07/26/2016   Mild with AHI 12.5/hr now on CPAP at 14cm H2O   PAF (paroxysmal atrial fibrillation) (HCC)    s/p PVI Ablation in 2018 // Flecainide Rx // Echo 8/21: EF 60-65, no RWMA, mild LVH, normal RV SF, moderate RVE, mild BAE, trivial MR, mild dilation of aortic root (40 mm)   Pneumonia    "may have had walking pneumonia a few years ago" (04/05/2017)   Pre-diabetes    Thrombophlebitis    Past Surgical History:  Procedure Laterality Date   ATRIAL FIBRILLATION ABLATION  04/05/2017   ATRIAL FIBRILLATION ABLATION N/A 04/05/2017   Procedure: ATRIAL FIBRILLATION ABLATION;  Surgeon: [name], MD;  Location: [facility];  Service: Cardiovascular;  Laterality: N/A;   BONE BIOPSY Right 12/17/2021   Procedure: BONE BIOPSY;  Surgeon: [name], MD;  Location: [facility];   Service: Orthopedics;  Laterality: Right;   COMPLETE RIGHT HIP REPLACMENT  01/19/2019   EVALUATION UNDER ANESTHESIA WITH HEMORRHOIDECTOMY N/A 02/24/2017   Procedure: EXAM UNDER ANESTHESIA WITH  HEMORRHOIDECTOMY;  Surgeon: [name], MD;  Location: [facility];  Service: General;  Laterality: N/A;   EXCISIONAL TOTAL HIP ARTHROPLASTY WITH ANTIBIOTIC SPACERS Right 09/25/2020   Procedure: EXCISIONAL TOTAL HIP ARTHROPLASTY WITH ANTIBIOTIC SPACERS;  Surgeon: [name], MD;  Location: [facility];  Service: Orthopedics;  Laterality: Right;   FLEXIBLE SIGMOIDOSCOPY N/A 04/15/2015   Procedure:  UNSEDATED FLEXIBLE SIGMOIDOSCOPY;  Surgeon: [name], MD;  Location: [facility];  Service: Endoscopy;  Laterality: N/A;   HUMERUS IM NAIL Right 12/17/2021   Procedure: INTRAMEDULLARY (IM) NAIL HUMERAL;  Surgeon: [name], MD;  Location: [facility];  Service: Orthopedics;  Laterality: Right;   KNEE ARTHROSCOPY Left ~ 2016   POSTERIOR CERVICAL FUSION/FORAMINOTOMY N/A 06/04/2022   Procedure: Thoracic one - Thoracic Four Posterior lateral arthrodesis/ fusion and Thoracic two Corpectomy;  Surgeon: [name], MD;  Location: [facility];  Service: Neurosurgery;  Laterality: N/A;   REIMPLANTATION OF TOTAL HIP Right 12/25/2020   Procedure: REIMPLANTATION/REVISION OF RIGHT TOTAL HIP VERSUS REPEAT IRRIGATION AND DEBRIDEMENT;  Surgeon: [name], MD;  Location: [facility];  Service: Orthopedics;  Laterality: Right;   TESTICLE SURGERY  1988   VARICOCELE   TONSILLECTOMY     VARICOSE VEIN SURGERY Bilateral    Patient Active Problem List   Diagnosis Date Noted   Pancreatic cyst 09/30/2022   Infection due to novel influenza A virus 09/29/2022   Persistent atrial fibrillation (HCC) 07/15/2022   Acute on chronic diastolic heart failure (HCC) 07/03/2022   Pulmonary infiltrates 07/01/2022   Acute respiratory failure with hypoxia (HCC) 07/01/2022   Hyponatremia 06/29/2022   History of thoracic surgery  06/29/2022   Acute DVT of left tibial vein (HCC) 06/21/2022   Hypokalemia 06/21/2022   Vitamin D deficiency 05/30/2022   Other constipation 05/29/2022   Pathologic compression fracture of spine, initial encounter (HCC) 05/29/2022   Cord compression (HCC) 05/28/2022   Degenerative cervical disc 05/14/2022   Hypercoagulable state due to persistent atrial fibrillation (HCC) 01/11/2022   Multiple myeloma (HCC) 12/10/2021   Medication monitoring encounter 02/27/2021   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   Status post peripherally inserted central catheter (PICC) central line placement 11/06/2020   Drug rash with eosinophilia and systemic symptoms syndrome 11/06/2020   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   PAF (paroxysmal atrial fibrillation) (HCC)    GERD (gastroesophageal reflux disease) 03/29/2018   Hiatal hernia 03/29/2018   History of DVT (deep vein thrombosis) 03/29/2018   CAP (community acquired pneumonia) 03/29/2018   Osteoarthritis 03/29/2018   Neuropathy 08/29/2017   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   Paroxysmal atrial fibrillation with rapid ventricular response (HCC) 04/05/2017   Prolapsed internal hemorrhoids, grade 4, s/p ligation/pexy/hemorrhoidectomy x 2 02/24/2017 02/24/2017   OSA (obstructive sleep apnea) 07/26/2016   Obesity (BMI 30-39.9) 07/26/2016   Atrial fibrillation with RVR (HCC)    Essential hypertension    Chest pain at rest     PCP: Eleanora Neighbor, MD  REFERRING PROVIDER: Antony Blackbird, MD  REFERRING DIAG: Multiple Myeloma  THERAPY DIAG:  Compression fracture of thoracic vertebra, unspecified thoracic vertebral level, initial encounter (HCC)  Multiple myeloma not having achieved remission (HCC)  Muscle weakness (generalized)  Abnormal posture  Neck pain  Other lack of coordination  Chronic right shoulder pain  ONSET DATE: 2023  Rationale for Evaluation and Treatment: Rehabilitation  SUBJECTIVE:                                                                                                                                                                                            SUBJECTIVE STATEMENT: Yesterday bad day, but I did power wash the driveway some, today much better. My pain  and soreness is really improving gradually as is my ability to lift my chin.  PERTINENT HISTORY:  OLIE DIBERT 72 y.o. male with medical history significant for IgA Kappa Multiple Myeloma. He was diagnosed with smouldering myeloma in 2018 . Last July he developed right arm pain due to a pathologic fx and he underwent radiation and fixation. In early Feb 2024 he had radiation for pathologic T2 compression fx followed by a T1-T4 fusion. PAIN:  Are you having pain? No, same neck tightness and soreness at between shoulder blades but not pain today.    PRECAUTIONS: Multiple Myeloma with mets to R ribs, R humerus with ORIF for pathological fracture 12/17/21. Lesions have also been found in R rib and also in his skull. Pathologic fx T2 with fusion T1-4 on 06/04/22, Bilateral LE edema (on Lasix), Afib   WEIGHT BEARING RESTRICTIONS: No  FALLS:  Has patient fallen in last 6 months? Yes. Number of falls 1  LIVING ENVIRONMENT: Lives with: lives with their family and lives with their spouse Lives in: House/apartment Stairs: Yes; Internal: 14 steps; on left going up and External: 3 steps; on right going up Has following equipment at home: Single point cane and Walker - 4 wheeled  OCCUPATION: attorney/ mostly retired?  LEISURE: golf (can't do now.), use to play racquetball  HAND DOMINANCE: right   PRIOR LEVEL OF FUNCTION: Independent  POSTURE:significant slump sitting posture with forward head  PATIENT GOALS: decrease pain,get stronger,improve balance   OBJECTIVE:  COGNITION: Overall cognitive status: Within functional limits for tasks assessed   PALPATION: Very tender bilateral UT/levator, scapular  regions  OBSERVATIONS / OTHER ASSESSMENTS: very flexed head and neck with chin nearly at chest  SENSATION: Light touch: Appears intact    POSTURE: significant forward head, round shoulders, increased kyphosis   Gait holding single point cane in left hand but not using. Right shoulder low, flexed neck;difficulty raising head to see where he is going  UPPER EXTREMITY AROM/PROM:  A/PROM RIGHT   eval  RIGHT 11/08/2022  Shoulder extension    Shoulder flexion 91 with compensation 101 with compensation   Shoulder abduction 67 with compensation 75 with compensation  Shoulder internal rotation    Shoulder external rotation C6     (Blank rows = not tested)  A/PROM LEFT   eval  Shoulder extension   Shoulder flexion 123  Shoulder abduction 112  Shoulder internal rotation T7  Shoulder external rotation t3    (Blank rows = not tested)  CERVICAL AROM: All within normal limits:    Percent limited 11/08/22  Flexion Fully flexed   Extension From flexed position decreased 80% Decreased 70%  Right lateral flexion    Left lateral flexion    Right rotation Dec 50% sitting in FH posture   Left rotation        Dec 50%     UPPER EXTREMITY STRENGTH:    LOWER EXTREMITY AROM/PROM:  A/PROM Right eval  Hip flexion   Hip extension   Hip abduction   Hip adduction   Hip internal rotation   Hip external rotation   Knee flexion   Knee extension   Ankle dorsiflexion   Ankle plantarflexion   Ankle inversion   Ankle eversion    (Blank rows = not tested)  A/PROM LEFT eval  Hip flexion   Hip extension   Hip abduction   Hip adduction   Hip internal rotation   Hip external rotation   Knee flexion   Knee extension  Ankle dorsiflexion   Ankle plantarflexion   Ankle inversion   Ankle eversion    (Blank rows = not tested)  LOWER EXTREMITY MMT:    10/04/2022  4 position balance test; able to hold DLS feet together , and half tandem stance 10 seconds. Unable to maintain full  tandem stance for 10 sec, unable to hold SLS greater than a second or 2 on left, and 4 seconds on right.     LYMPHEDEMA ASSESSMENTS:   SURGERY TYPE/DATE: 8/17/23Left prox humerus fixation for pathologic fx  2/2/2024Fusion T1-T4 for pathologic fx with cord compression 06/04/22 posterior cervical fusion/foraminotomy;T1-4 fusion NUMBER OF LYMPH NODES REMOVED: NA  CHEMOTHERAPY: Velcade/promalyst/Dex;  RADIATION:to prox humerus and thoracic spine pathologic fx  HORMONE TREATMENT: NA  INFECTIONS: NA  FUNCTIONAL TESTS:  10/09/2022;30 second sit to stand 10 with heavy use of arms  GAIT: Distance walked: 20 Assistive device utilized: carried SPC in left Level of assistance: Complete Independence Comments: feels unsteady, very flexed neck posture   QUICK DASH SURVEY:    TODAY'S TREATMENT:                                                                                                                                         DATE:   12/03/22:Pt seen for aquatic therapy today.  Treatment took place in water 3.5-4.75 ft in depth at the [facility] pool. Temp of water was 91.  Pt entered/exited the pool via stairs with bilat rail independently. Pt requires buoyancy of water for support and to offload joints with strengthening exercises.Pt utilizes viscosity of the water required for strengthening.   Seated water bench with 75% submersion Pt performed seated LE AROM exercises 20x in all planes, with concurrent discussion of his current health status and pain assessment. 75% depth water walking with single buoy UE wts for push/pull 10 lengths. 3 way stance: parallel, tandem both sides with hand paddles: 10x2 bil arms forward & backwards. Hip 3 way kicks 15x Bil holding onto wall for balance with Vc to push water a bit faster.  Horizontal float with concurrent skin rolling on upper back and lateral sway for trunk stretching by PTA.   11/29/22: Therapeutic Exercises and Neuro Re Ed In //  bars: 6" step ups x 10 each; after SpO2 97%, HR 71 bpm  Nu step seat 12, UE 8, lev 4 x 8 min, 710 steps, 97% O2 , HR 90-95 bpm In // bars for following: Mini Squats 2 x 10; slow, controlled high knee marching x 3 length of bars; standing on blue oval for 3 way hip raises x 10 each returning therapist demo for each and tactile cues to keep Rt knee extended with ext; short seated rest to check vitals SpO2 98% and HR 90-99 BPM after Green theraband (red was too easy) for bil scap retract x 20 then bil UE ext x 20, seated rest after  Wall Push Ups 2 x 10, pt reports liking these and plans to add them to his HEP Manual Therapy STM seated in chair with cocoa butter to bil UT/upper scapular regions, and cervical and thoracic paraspinals where pt palpably very tight.   11/24/22:Pt seen for aquatic therapy today.  Treatment took place in water 3.5-4.75 ft in depth at the [facility] pool. Temp of water was 90.  Pt entered/exited the pool via stairs with bilat rail independently. Pt requires buoyancy of water for support and to offload joints with strengthening exercises.  Seated water bench with 75% submersion Pt performed seated LE AROM exercises 20x in all planes, with concurrent discussion of his current health status and pain assessment. 75% depth water walking with single buoy UE wts for push/pull 8 lengths. 3 way stance: parallel, tandem both sides with hand paddles: 10x2 bil arms forward & backwards. Hip 3 way kicks 10x Bil holding onto wall for balance.Bil heel raises 15x no UE support needed in water. 10 min horizontal float with concurrent skin rolling on upper back.    PATIENT EDUCATION:  Education details: sitting posture with lumbar roll Person educated: Patient Education method: Medical illustrator Education comprehension: verbalized understanding and returned demonstration  HOME EXERCISE PROGRAM: Doing HEP from HHPT; cervical ROM, shoulder rolls, shrugs, scap retraction,  UT stretches  Aquatic Access Code: [email or web address] URL: [email or web address] Date: 11/09/2022 Prepared by: [facility] [address] This aquatic home exercise program from Med[employer] utilizes pictures from land based exercises, but has been adapted prior to lamination and issuance.    ASSESSMENT:  CLINICAL IMPRESSION: Pt arrives to aquatic PT with some soreness in his neck but reports he sees the gradual improvements. Some ability to move faster in the water today without loss of balance.   OBJECTIVE IMPAIRMENTS: decreased activity tolerance, decreased balance, difficulty walking, decreased ROM, decreased strength, increased edema, impaired UE functional use, postural dysfunction, and pain.   ACTIVITY LIMITATIONS: carrying, lifting, dressing, reach over head, and locomotion level  PARTICIPATION LIMITATIONS: cleaning, laundry, driving, and occupation  PERSONAL FACTORS:  Multiple Myeloma  are also affecting patient's functional outcome.   REHAB POTENTIAL: Good  CLINICAL DECISION MAKING: Evolving/moderate complexity  EVALUATION COMPLEXITY: Moderate  GOALS: Goals reviewed with patient? Yes  SHORT TERM GOALS: Target date: 10/21/22  Pt will be educated in and independent in proper sitting posture using lumbar roll prn Baseline: Goal status; MET 11/08/22 2.  Pt will report decreased muscle soreness in bilateral upper back x 25% Baseline:  Goal status: ONGOING  3.  Pt will be compliant with a HEP for postural strengthening/ROM Baseline:  Goal status: MET / LONG TERM GOALS: Target date: 12/20/22  Pt will be able to bring his head to neutral position for safety with ambulation/driving Baseline:  Goal status:Ongoing 2.  Pt will have decreased upper back pain by 50% or more Baseline:  Goal status:Ongoing 3.  Pt will be able to flex right shoulder to 115 degrees with minimal compensation for improved reaching ability Baseline:  Goal status: ONGOING 4; Pt  will be able to maintain tandem stance on both sides x 10 sec to demonstrated improved balance. Goal status:MET right foot forward 11/08/2022, Ongoing left foot forward  5. Pt will be able to perform sit to stand x 5 without use of Ue's  Baseline:must use hands  Goal status:New  PT FREQUENCY: 2x/week  PT DURATION: 6 weeks  PLANNED INTERVENTIONS: Therapeutic exercises, Therapeutic activity, Neuromuscular re-education, Balance training, Gait training, Patient/Family education,  Self Care, Joint mobilization, Aquatic Therapy, Manual therapy, and Re-evaluation  PLAN FOR NEXT SESSION: Cont land and aquatic exercises working on posture, strength, balance, STM prn for pain  Ane Payment, PTA 12/03/22 12:48 PM

## 2022-12-02 NOTE — Progress Notes (Signed)
[facility] [phone]   [phone]  PROGRESS NOTE  Patient Care Team: Travis Aspen, MD as PCP - General (Internal Medicine) Travis Riffle, MD as PCP - Cardiology (Cardiology) Travis Reichert, MD as PCP - Sleep Medicine (Cardiology) Travis Prude, MD as PCP - Electrophysiology (Cardiology) Travis Soda, MD as Consulting Physician (General Surgery) Travis Rigger, MD as Consulting Physician (Gastroenterology) Travis Reichert, MD as Consulting Physician (Sleep Medicine) Travis Chard, DO as Consulting Physician (Neurology)  Hematological/Oncological History # IgA Kappa Multiple Myeloma 05/05/2020: Bone marrow biopsy showed increased number of plasma cells representing 14% of all cells in the aspirate associated with numerous variably sized clusters in the clot and biopsy sections 04/10/2021: M protein 0.3, IgA kappa specificity. Kappa 580.5, Labda 7.0, ratio 82.93.   12/07/2021: Labs show of 1971.3, lambda 5.5, ratio 358.42 12/10/2021: Transition care to Dr. Leonides [name]. 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  01/26/2022: Cycle 2 Day 1 of Dara/Rev/Dex 02/22/2022: Cycle 3 Day 1 of Dara/Rev/Dex 03/23/2022: Cycle 4 Day 1 of Dara/Rev/Dex 04/20/2022: Cycle 5 Day 1 of Dara/Rev/Dex 05/18/2022: Cycle 6 Day 1 of Dara/Rev/Dex 05/27/2022: MRI cervical/thoracic/lumbar: pathologic fracture at T2 with significant osseous retropulsion and resulting cord compression. Additional smaller lesions within T1 vertebral body, right C7 and T3 articulating facets.  Small Multiple Myeloma lesions scattered at all lumbar levels and in the visible left sacral ala. Largest lumbar tumor is 2.3 cm in the left L1 posterior elements and pedicle. No lumbar extraosseous or epidural tumor extension, or pathologic fracture.Underlying chronic lumbar spine  degeneration, with a new central disc herniation at L3-L4 since 2021 now resulting in mild to moderate degenerative spinal stenosis there. 05/28/2022-06/09/2022: Admitted for T2 pathologic fracture and cord compression. He received urgent radiation therapy for a total of 3.0 Gy. He underwent  thoracic 1 through 4 posterior lateral arthrodesis/fusion and 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  06/23/2022: Cycle 1 Day 1 of Velcade/Pomalyst/Dex 08/04/2022: Cycle 2 Day 1 of Velcade/Pomalyst/Dex. Pomalyst to be decreased to 2mg  PO daily due to cytopenias.  09/01/2022: Cycle 3 Day 1 of Velcade/Pomalyst/Dex. (Missed 2nd dose of cycle due to hospitalization)  10/06/2022: Cycle 4 Day 1 of Velcade/Pomalyst/Dex.  10/27/2022: Cycle 5 Day 1 of Velcade/Pomalyst/Dex.  12/02/2022: Cycle 6 Day 1 of Velcade/Pomalyst/Dex.   Interval History:  Travis [name] 72 y.o. male with medical history significant for IgA Kappa Multiple Myeloma who presents for a follow up visit. He was last seen on 11/24/2022. In the interim, he continued on Velcade/Pomalyst/Dex therapy.  On exam today Travis [name] reports he has been developing increased fluid buildup.  He is currently taking dexamethasone 20 mg with each treatment.  He reports that there is fluid building up in his belly and legs.  He is also noted some in his face but he is not having any shortness of breath or cough.  He is taking torsemide which has increased his urination.  He reports exercise is tough under these current circumstances.  He notes that he does the elliptical about once per week.  He notes his energy levels are so-so.  Otherwise he has not had any infectious symptoms and is willing and able to proceed with treatment at this time he denies fevers, chills, sweats, shortness of breath, chest pain or cough. He has no other complaints. Rest of the 10 point ROS is below.  MEDICAL HISTORY:  Past Medical History:  Diagnosis Date   A-fib [facility])     Arthritis    "comes and goes; mostly in hands, occasionally elbow" (04/05/2017)   Complication of anesthesia    "just wanted to sleep alot  for couple days S/P VARICOCELE OR" (04/05/2017)   DVT (deep venous thrombosis) (HCC)    LLE   Dysrhythmia    PVCs    GERD (gastroesophageal reflux disease)    History of hiatal hernia    History of radiation therapy    Thoracic Spine- 07/13/22-07/23/22-Dr. Antony [name]   Hypertension    Impaired glucose tolerance    Multiple myeloma (HCC)    Obesity (BMI 30-39.9) 07/26/2016   OSA on CPAP 07/26/2016   Mild with AHI 12.5/hr now on CPAP at 14cm H2O   PAF (paroxysmal atrial fibrillation) (HCC)    s/p PVI Ablation in 2018 // Flecainide Rx // Echo 8/21: EF 60-65, no RWMA, mild LVH, normal RV SF, moderate RVE, mild BAE, trivial MR, mild dilation of aortic root (40 mm)   Pneumonia    "may have had walking pneumonia a few years ago" (04/05/2017)   Pre-diabetes    Thrombophlebitis     SURGICAL HISTORY: Past Surgical History:  Procedure Laterality Date   ATRIAL FIBRILLATION ABLATION  04/05/2017   ATRIAL FIBRILLATION ABLATION N/A 04/05/2017   Procedure: ATRIAL FIBRILLATION ABLATION;  Surgeon: Travis Range, MD;  Location: [facility];  Service: Cardiovascular;  Laterality: N/A;   BONE BIOPSY Right 12/17/2021   Procedure: BONE BIOPSY;  Surgeon: Travis Pippin, MD;  Location: [facility];  Service: Orthopedics;  Laterality: Right;   COMPLETE RIGHT HIP REPLACMENT  01/19/2019   EVALUATION UNDER ANESTHESIA WITH HEMORRHOIDECTOMY N/A 02/24/2017   Procedure: EXAM UNDER ANESTHESIA WITH  HEMORRHOIDECTOMY;  Surgeon: Travis Soda, MD;  Location: [facility];  Service: General;  Laterality: N/A;   EXCISIONAL TOTAL HIP ARTHROPLASTY WITH ANTIBIOTIC SPACERS Right 09/25/2020   Procedure: EXCISIONAL TOTAL HIP ARTHROPLASTY WITH ANTIBIOTIC SPACERS;  Surgeon: Travis Romans, MD;  Location: [facility];  Service: Orthopedics;  Laterality: Right;   FLEXIBLE SIGMOIDOSCOPY  N/A 04/15/2015   Procedure:  UNSEDATED FLEXIBLE SIGMOIDOSCOPY;  Surgeon: Travis Bumpers, MD;  Location: [facility];  Service: Endoscopy;  Laterality: N/A;   HUMERUS IM NAIL Right 12/17/2021   Procedure: INTRAMEDULLARY (IM) NAIL HUMERAL;  Surgeon: Travis Pippin, MD;  Location: [facility];  Service: Orthopedics;  Laterality: Right;   KNEE ARTHROSCOPY Left ~ 2016   POSTERIOR CERVICAL FUSION/FORAMINOTOMY N/A 06/04/2022   Procedure: Thoracic one - Thoracic Four Posterior lateral arthrodesis/ fusion and Thoracic two Corpectomy;  Surgeon: [name], MD;  Location: [facility];  Service: Neurosurgery;  Laterality: N/A;   REIMPLANTATION OF TOTAL HIP Right 12/25/2020   Procedure: REIMPLANTATION/REVISION OF RIGHT TOTAL HIP VERSUS REPEAT IRRIGATION AND DEBRIDEMENT;  Surgeon: Travis Romans, MD;  Location: [facility];  Service: Orthopedics;  Laterality: Right;   TESTICLE SURGERY  1988   VARICOCELE   TONSILLECTOMY     VARICOSE VEIN SURGERY Bilateral     SOCIAL HISTORY: Social History   Socioeconomic History   Marital status: Married    Spouse name: Not on file   Number of children: 1   Years of education: law school   Highest education level: Not on file  Occupational History   Occupation: lawyer  Tobacco Use   Smoking status: Never   Smokeless tobacco: Never  Vaping Use   Vaping status: Never Used  Substance  and Sexual Activity   Alcohol use: Yes    Alcohol/week: 2.0 standard drinks of alcohol    Types: 2 Glasses of wine per week    Comment: 2 glasses of wine a week, occ bourbon, beer   Drug use: No   Sexual activity: Not Currently  Other Topics Concern   Not on file  Social History Narrative   Lives in [place] with spouse in a 2 story home.  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 1 daughter.     Education: Social worker school.    Social Determinants of Health   Financial Resource Strain: Not on file  Food Insecurity: No Food Insecurity (09/27/2022)   Hunger  Vital Sign    Worried About Running Out of Food in the Last Year: Never true    Ran Out of Food in the Last Year: Never true  Transportation Needs: No Transportation Needs (09/27/2022)   PRAPARE - Administrator, Civil Service (Medical): No    Lack of Transportation (Non-Medical): No  Physical Activity: Not on file  Stress: Not on file  Social Connections: Unknown (09/15/2021)   [facility]   Social Network    Social Network: Not on file  Intimate Partner Violence: Not At Risk (09/27/2022)   Humiliation, Afraid, Rape, and Kick questionnaire    Fear of Current or Ex-Partner: No    Emotionally Abused: No    Physically Abused: No    Sexually Abused: No    FAMILY HISTORY: Family History  Problem Relation Age of Onset   Dementia Mother    Stroke Father    Atrial fibrillation Father    Hypertension Father    Hypertension Paternal Grandfather    Dementia Maternal Grandmother     ALLERGIES:  is allergic to linezolid and vancomycin.  MEDICATIONS:  Current Outpatient Medications  Medication Sig Dispense Refill   acyclovir (ZOVIRAX) 400 MG tablet Take 1 tablet (400 mg total) by mouth 2 (two) times daily. 180 tablet 3   albuterol (VENTOLIN HFA) 108 (90 Base) MCG/ACT inhaler Inhale 2 puffs into the lungs every 6 (six) hours as needed for wheezing or shortness of breath. 8 g 0   amiodarone (PACERONE) 200 MG tablet Take 1 tablet (200 mg total) by mouth daily. Start after finishing 40 mg twice daily dose of  days (Patient taking differently: Take 200 mg by mouth daily.) 180 tablet 1   apixaban (ELIQUIS) 5 MG TABS tablet Take 1 tablet (5 mg total) by mouth 2 (two) times daily. 60 tablet 11   cholecalciferol (VITAMIN D3) 25 MCG (1000 UNIT) tablet Take 1,000 Units by mouth daily.     Cyanocobalamin (VITAMIN B-12 PO) Take 1,000 mcg by mouth daily.     dexamethasone (DECADRON) 4 MG tablet Take 10 tablets (40 mg total) by mouth every 7 (seven) days. Take 40  mg by mouth once a day in the morning of chemo on Wednesdays- or unless otherwise instructed 40 tablet 2   fluticasone (FLONASE) 50 MCG/ACT nasal spray Place 1 spray into both nostrils daily as needed for allergies or rhinitis.     gabapentin (NEURONTIN) 300 MG capsule Take 2 capsules (600 mg total) by mouth 2 (two) times daily. 360 capsule 1   hydrALAZINE (APRESOLINE) 50 MG tablet Take 1.5 tablets (75 mg total) by mouth 3 (three) times daily. 135 tablet 3   irbesartan (AVAPRO) 150 MG tablet Take 1 tablet (150 mg total) by mouth  daily. 30 tablet 5   metolazone (ZAROXOLYN) 2.5 MG tablet Take 1 tablet (2.5 mg total) by mouth every other day. (Patient not taking: Reported on 12/02/2022) 45 tablet 3   metoprolol succinate (TOPROL-XL) 50 MG 24 hr tablet Take 1/2 to 1 as needed up to twice a day for AFIB and heart rate above 100. 90 tablet 3   Omega-3 Fatty Acids (FISH OIL PO) Take 1 Capful by mouth daily.     pomalidomide (POMALYST) 2 MG capsule Take 1 capsule (2 mg total) by mouth daily. Take one tablet (2 mg by mouth) daily for 21 days, then take 7 days off [employer] auth # [id number] Date obtained 11/22/22 21 capsule 0   potassium chloride SA (KLOR-CON M) 20 MEQ tablet Take 2 tablets (40 mEq total) by mouth daily. Take an extra tablet on the day you take the Metolazone (60 meq total) (Patient taking differently: Take 40 mEq by mouth 3 (three) times daily. Take an extra tablet on the day you take the Metolazone (60 meq total)) 225 tablet 3   sodium chloride (OCEAN) 0.65 % SOLN nasal spray Place 1 spray into both nostrils as needed for congestion.     torsemide (DEMADEX) 20 MG tablet Take 1 tablet today, then tomorrow take 2 tablets by mouth for 2 days then decrease to 1 tablet daily. May take an extra tablet for weight gain of 3lb or more overnight or 5lb in a week. 180 tablet 3   No current facility-administered medications for this visit.    REVIEW OF SYSTEMS:   Constitutional: ( - ) fevers, ( - )  chills ,  ( - ) night sweats Eyes: ( - ) blurriness of vision, ( - ) double vision, ( - ) watery eyes Ears, nose, mouth, throat, and face: ( - ) mucositis, ( - ) sore throat Respiratory: ( - ) cough, ( - ) dyspnea, ( - ) wheezes Cardiovascular: ( - ) palpitation, ( - ) chest discomfort, ( - ) lower extremity swelling Gastrointestinal:  ( - ) nausea, ( - ) heartburn, ( - ) change in bowel habits Skin: ( - ) abnormal skin rashes Lymphatics: ( - ) new lymphadenopathy, ( - ) easy bruising Neurological: ( - ) numbness, ( - ) tingling, ( - ) new weaknesses Behavioral/Psych: ( - ) mood change, ( - ) new changes  All other systems were reviewed with the patient and are negative.  PHYSICAL EXAMINATION: ECOG PERFORMANCE STATUS: 2 - Symptomatic, <50% confined to bed  Vitals:   12/02/22 1201  BP: (!) 159/74  Pulse: 78  Resp: 15  Temp: 97.9 F (36.6 C)  SpO2: 97%         Filed Weights   12/02/22 1201  Weight: 259 lb 11.2 oz (117.8 kg)     GENERAL: Well-appearing elderly Caucasian male, alert, no distress and comfortable SKIN: no overt abnormalities of the skin.  EYES: conjunctiva are pink and non-injected, sclera clear LUNGS: clear to auscultation and percussion with normal breathing effort HEART: tachycardic &  no murmurs. +bilateral lower extremity edema, left greater than right Musculoskeletal: no cyanosis of digits and no clubbing  PSYCH: alert & oriented x 3, fluent speech NEURO: no focal motor/sensory deficits  LABORATORY DATA:  I have reviewed the data as listed    Latest Ref Rng & Units 12/02/2022   11:17 AM 11/24/2022    1:31 PM 11/17/2022    2:08 PM  CBC  WBC 4.0 - 10.5 K/uL 5.4  9.6  7.8   Hemoglobin 13.0 - 17.0 g/dL 96.0  45.4  09.8   Hematocrit 39.0 - 52.0 % 33.1  35.3  31.8   Platelets 150 - 400 K/uL 153  184  161        Latest Ref Rng & Units 12/02/2022   11:17 AM 11/24/2022    1:31 PM 11/17/2022    2:08 PM  CMP  Glucose 70 - 99 mg/dL 97  119  147   BUN 8 - 23 mg/dL  25  27  27    Creatinine 0.61 - 1.24 mg/dL 8.29  5.62  1.30   Sodium 135 - 145 mmol/L 144  144  142   Potassium 3.5 - 5.1 mmol/L 3.7  4.1  3.1   Chloride 98 - 111 mmol/L 109  110  102   CO2 22 - 32 mmol/L 26  27  33   Calcium 8.9 - 10.3 mg/dL 8.4  9.1  8.8   Total Protein 6.5 - 8.1 g/dL 5.8  6.1  5.9   Total Bilirubin 0.3 - 1.2 mg/dL 0.4  0.5  0.4   Alkaline Phos 38 - 126 U/L 76  92  97   AST 15 - 41 U/L 12  20  27    ALT 0 - 44 U/L 19  30  31      Lab Results  Component Value Date   MPROTEIN Not Observed 11/03/2022   MPROTEIN Not Observed 10/27/2022   MPROTEIN Not Observed 10/06/2022   Lab Results  Component Value Date   KPAFRELGTCHN 94.2 (H) 12/02/2022   KPAFRELGTCHN 44.5 (H) 11/03/2022   KPAFRELGTCHN 41.8 (H) 10/27/2022   LAMBDASER 20.3 12/02/2022   LAMBDASER 13.5 11/03/2022   LAMBDASER 15.2 10/27/2022   KAPLAMBRATIO 4.64 (H) 12/02/2022   KAPLAMBRATIO 3.30 (H) 11/03/2022   KAPLAMBRATIO 2.75 (H) 10/27/2022     RADIOGRAPHIC STUDIES: LONG TERM MONITOR (3-14 DAYS)  Result Date: 12/01/2022 Impression:   Rhythms:   Sinus rhythm and atrial fibrillation (47% burden  Longest episode for 1 day, 10 hours 23 min on 11/15/22)  Heart rates 42 to 158 bpms   Average HR 76 bpm.  Rare PVCs Couple triplets    Triggered events corresponded to Sinus rhythm and atrial fibrillation  None were at extreme heart rates ___________________________________________________________________________ ________  The Maximum Heart Rate recorded was 158 bpm, 07/ 19 19: 41: 59, the Minimum Heart Rate recorded was 42 bpm, 07/ 22 03: 22: 34, and the Average Heart Rate was 76 bpm. * There were 6, 934 VE beats with a burden of < 1 % . There was 1 occurrence of Ventricular Tachycardia with the Fastest episode 158 bpm, 07/ 19 19: 41: 59, and the Longest episode 3 beats, 07/ 19 19: 41: 59. * There were 7, 614 SVE beats with a burden of < 1 % . There were 41 occurrences of Supraventricular Tachycardia with the Fastest episode  129 bpm, 07/ 16 20: 46: 35, and the Longest episode 7 beats, 07/ 18 22: 21: 21. * The study included an Atrial Fibrillation/ Flutter Burden of 47 % with 38 % in RVR and < 1 % in SVR. The longest episode was 1d 10h 44m 50. 4s, 07/ 15 09: 38: 44, and the Fastest episode was 152 bpm, 07/ 12 01: 18: 52. * Other rhythms in this study include: Ventricular Run.    ASSESSMENT & PLAN EL PILE is a 72 y.o. male with medical history significant for IgA Kappa Multiple Myeloma  who presents for a follow up visit.   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  He meets diagnostic criteria by having a serum free light chain ratio greater than 100 and lytic lesions causing pathological fracture of the humerus.  Given this I would recommend we pursued Darzalex, Revlimid, and dexamethasone.  Given his advanced age I would not recommend transplantation, that this is something we can consider when it comes time to transition to maintenance therapy versus transplant.  The patient voices understanding of the plan moving forward.  # IgA Kappa Multiple Myeloma --Metastatic survey completed 12/15/2021 --Patient underwent fixation of his humerus on 12/17/2021, pathology confirmed plasmacytoma/plasma cell neoplasm.  --Received 6 cycles of Darzalex, Revlimid, and dexamethasone started on 12/28/2021-05/18/2022.  --Due to worsening myeloma osseous lesions including pathologic fracture at T2 with cord compression, Dr. Leonides [name] recommends changing therapy to Velcade/Pomalyst/Dex.  Plan: --Due for Cycle 6, Day 1 of Velcade/Pom/Dex today --labs from today showed white blood cell 5.4, hemoglobin 10.6, MCV 94, and platelets of 153 --restaging labs from 11/03/2022 with SPEP and SFLC showed M protein not detectable. Kappa increased to 44.5 (from 24.2), Lambda 13.5, Ratio 3.30  --Pomalyst was dose reduced to 2 mg p.o. due to cytopenias.  Recommend to continue --RTC in 2 weeks with interval weekly treatments.   #Double vision: --Discussed with Dr. Leonides [name] who recommend to monitor and continue with Velcade therapy.  --Proceed with corrective lenses as prescribed by opthalmologist.   #Neck pain and mid back pain: --Suspect pain is exacerbated from recent hospitalization and being in bed/sitting for long periods.  --MRI C/T/L spine obtinaed that showed unchanged lytic lesions in the right C7 inferior articular process,T6, T8, and T10  vertebral bodies, as well  as the T11 spinous process. Unchanged  multilevel cervical spondylsis, Interval enlargement of disc herniation at L3-L4 with progressive spinal canal stenosis. No new or progressive lytic lesions.  --Encouraged to continue with physical therapy and follow up with ortho.   #Palliative Radiation Therapy: # Dysphagia --Received palliative radiation from 01/06/2022-01/19/2022 to right hymerus, right forearm and right chest/rib, 20 Gy in 10 Fx.  --recieved palliative radiation to surgical bed in the upper thoracic spine area on 07/13/2022.  27 Gy in 9 Fx.   #Lower extremity edema: --Currently on lasix and torsemide with minimal improvement. Advised to follow up with cardiologist to discuss dose or medication changes.   # Rash Concerning for Shingles-resolved -- Prescribed valacyclovir 3 times daily 1000 mg x 10 days-completed the course -- We will continue to monitor rash closely.  #DVT: --Found to have acute DVT involving left peroneal veins. Likely secondary to recent surgery and hospitalization --Currently on eliquis 5 mg BID.    #Hypokalemia: --Currently on potassium chloride 40 mEq per day as prescribed but takes three times a day (60 mEq) on the days he takes Metolazine.  --Potassium level is 4.1today. No further intervention needed.   #Rash on scalp and right forearm: --Recommend topical hydrocortisone cream for spot treatment  #Supportive Care -- chemotherapy  education complete -- port placement not required -- zofran 8mg  q8H PRN and compazine 10mg  PO q6H for nausea -- acyclovir 400mg  PO BID for VCZ prophylaxis -- Started Zometa q 12 weeks on 08/18/2022. Next dose July 2024.   No orders of the defined types were placed in this encounter.  All questions were answered. The patient knows to call the clinic with any problems, questions  or concerns.  A total of more than 30 minutes were spent on this encounter with face-to-face time and non-face-to-face time, including preparing to see the patient, ordering tests and/or medications, counseling the patient and coordination of care as outlined above.   Ulysees Barns, MD Department of Hematology/Oncology [facility] Phone: [phone] Pager: [phone] Email: [email or web address]    12/05/2022 7:49 PM

## 2022-12-03 ENCOUNTER — Encounter: Payer: Self-pay | Admitting: Physical Therapy

## 2022-12-03 ENCOUNTER — Telehealth: Payer: Self-pay

## 2022-12-03 ENCOUNTER — Ambulatory Visit: Payer: Medicare Other | Attending: Radiation Oncology | Admitting: Physical Therapy

## 2022-12-03 DIAGNOSIS — R293 Abnormal posture: Secondary | ICD-10-CM | POA: Diagnosis not present

## 2022-12-03 DIAGNOSIS — G8929 Other chronic pain: Secondary | ICD-10-CM | POA: Insufficient documentation

## 2022-12-03 DIAGNOSIS — M542 Cervicalgia: Secondary | ICD-10-CM | POA: Insufficient documentation

## 2022-12-03 DIAGNOSIS — S22000A Wedge compression fracture of unspecified thoracic vertebra, initial encounter for closed fracture: Secondary | ICD-10-CM | POA: Insufficient documentation

## 2022-12-03 DIAGNOSIS — C9000 Multiple myeloma not having achieved remission: Secondary | ICD-10-CM | POA: Insufficient documentation

## 2022-12-03 DIAGNOSIS — R278 Other lack of coordination: Secondary | ICD-10-CM | POA: Diagnosis not present

## 2022-12-03 DIAGNOSIS — M6281 Muscle weakness (generalized): Secondary | ICD-10-CM | POA: Diagnosis not present

## 2022-12-03 DIAGNOSIS — M25511 Pain in right shoulder: Secondary | ICD-10-CM | POA: Diagnosis not present

## 2022-12-03 NOTE — Telephone Encounter (Signed)
-----   Message from [name] sent at 11/27/2022  5:38 AM EDT ----- Not sure when patient has follow up with me  or even in cardiology   Just saw L[place]le [place]  Volume overloaded   Needs follow up closely ----- Message ----- From: Olene Floss, RPH-CPP Sent: 11/18/2022  11:51 AM EDT To: Pricilla Riffle, MD; Marinus Maw, MD; #

## 2022-12-03 NOTE — Telephone Encounter (Signed)
I called to follow up with the pt and he reports that he is having a good day today... he has been doing well with the fluid in his lower extremities and notices that they are better when he is "in AFIB".... he has been retaining fluid in his belly and face.. not sure if related to the steroids he takes every Wed before chemo... he usually takes 10 tabs but next week will only take 3 according to him.   He will see Dr [name] on 12/14/22... not sure if he needs to keep his APP appt with EP on 12/21/22.. I will keep for now until Dr [name] reviews. Pt last Pro BNP was 11/10/22 and asking to have another at his next set of Oncology labs next Wed... will message the Oncology PA.Georga Kaufmann.   Will forward to Dr [name].

## 2022-12-05 ENCOUNTER — Encounter: Payer: Self-pay | Admitting: Hematology and Oncology

## 2022-12-06 ENCOUNTER — Other Ambulatory Visit: Payer: Self-pay | Admitting: Physician Assistant

## 2022-12-06 ENCOUNTER — Ambulatory Visit: Payer: Medicare Other

## 2022-12-06 DIAGNOSIS — C9000 Multiple myeloma not having achieved remission: Secondary | ICD-10-CM

## 2022-12-06 DIAGNOSIS — G8929 Other chronic pain: Secondary | ICD-10-CM | POA: Diagnosis not present

## 2022-12-06 DIAGNOSIS — R293 Abnormal posture: Secondary | ICD-10-CM

## 2022-12-06 DIAGNOSIS — M6281 Muscle weakness (generalized): Secondary | ICD-10-CM | POA: Diagnosis not present

## 2022-12-06 DIAGNOSIS — R278 Other lack of coordination: Secondary | ICD-10-CM | POA: Diagnosis not present

## 2022-12-06 DIAGNOSIS — M542 Cervicalgia: Secondary | ICD-10-CM | POA: Diagnosis not present

## 2022-12-06 DIAGNOSIS — M25511 Pain in right shoulder: Secondary | ICD-10-CM | POA: Diagnosis not present

## 2022-12-06 DIAGNOSIS — S22000A Wedge compression fracture of unspecified thoracic vertebra, initial encounter for closed fracture: Secondary | ICD-10-CM | POA: Diagnosis not present

## 2022-12-06 NOTE — Therapy (Signed)
OUTPATIENT PHYSICAL THERAPY ONCOLOGY TREATMENT  Patient Name: Travis [name] MRN: [id number] DOB:09-Nov-1950, 72 y.o., male Today's Date: 12/06/2022   END OF SESSION:  PT End of Session - 12/06/22 1110     Visit Number 19    Number of Visits 23    Date for PT Re-Evaluation 12/20/22    PT Start Time 1107    PT Stop Time 1204    PT Time Calculation (min) 57 min    Activity Tolerance Patient tolerated treatment well    Behavior During Therapy WFL for tasks assessed/performed                 Past Medical History:  Diagnosis Date   A-fib (HCC)    Arthritis    "comes and goes; mostly in hands, occasionally elbow" (04/05/2017)   Complication of anesthesia    "just wanted to sleep alot  for couple days S/P VARICOCELE OR" (04/05/2017)   DVT (deep venous thrombosis) (HCC)    LLE   Dysrhythmia    PVCs    GERD (gastroesophageal reflux disease)    History of hiatal hernia    History of radiation therapy    Thoracic Spine- 07/13/22-07/23/22-Dr. Antony Blackbird   Hypertension    Impaired glucose tolerance    Multiple myeloma (HCC)    Obesity (BMI 30-39.9) 07/26/2016   OSA on CPAP 07/26/2016   Mild with AHI 12.5/hr now on CPAP at 14cm H2O   PAF (paroxysmal atrial fibrillation) (HCC)    s/p PVI Ablation in 2018 // Flecainide Rx // Echo 8/21: EF 60-65, no RWMA, mild LVH, normal RV SF, moderate RVE, mild BAE, trivial MR, mild dilation of aortic root (40 mm)   Pneumonia    "may have had walking pneumonia a few years ago" (04/05/2017)   Pre-diabetes    Thrombophlebitis    Past Surgical History:  Procedure Laterality Date   ATRIAL FIBRILLATION ABLATION  04/05/2017   ATRIAL FIBRILLATION ABLATION N/A 04/05/2017   Procedure: ATRIAL FIBRILLATION ABLATION;  Surgeon: [name], MD;  Location: [facility];  Service: Cardiovascular;  Laterality: N/A;   BONE BIOPSY Right 12/17/2021   Procedure: BONE BIOPSY;  Surgeon: [name], MD;  Location: [facility];   Service: Orthopedics;  Laterality: Right;   COMPLETE RIGHT HIP REPLACMENT  01/19/2019   EVALUATION UNDER ANESTHESIA WITH HEMORRHOIDECTOMY N/A 02/24/2017   Procedure: EXAM UNDER ANESTHESIA WITH  HEMORRHOIDECTOMY;  Surgeon: [name], MD;  Location: [facility];  Service: General;  Laterality: N/A;   EXCISIONAL TOTAL HIP ARTHROPLASTY WITH ANTIBIOTIC SPACERS Right 09/25/2020   Procedure: EXCISIONAL TOTAL HIP ARTHROPLASTY WITH ANTIBIOTIC SPACERS;  Surgeon: [name], MD;  Location: [facility];  Service: Orthopedics;  Laterality: Right;   FLEXIBLE SIGMOIDOSCOPY N/A 04/15/2015   Procedure:  UNSEDATED FLEXIBLE SIGMOIDOSCOPY;  Surgeon: [name], MD;  Location: [facility];  Service: Endoscopy;  Laterality: N/A;   HUMERUS IM NAIL Right 12/17/2021   Procedure: INTRAMEDULLARY (IM) NAIL HUMERAL;  Surgeon: [name], MD;  Location: [facility];  Service: Orthopedics;  Laterality: Right;   KNEE ARTHROSCOPY Left ~ 2016   POSTERIOR CERVICAL FUSION/FORAMINOTOMY N/A 06/04/2022   Procedure: Thoracic one - Thoracic Four Posterior lateral arthrodesis/ fusion and Thoracic two Corpectomy;  Surgeon: [name], MD;  Location: [facility];  Service: Neurosurgery;  Laterality: N/A;   REIMPLANTATION OF TOTAL HIP Right 12/25/2020   Procedure: REIMPLANTATION/REVISION OF RIGHT TOTAL HIP VERSUS REPEAT IRRIGATION AND DEBRIDEMENT;  Surgeon: [name], MD;  Location: [facility];  Service: Orthopedics;  Laterality: Right;   TESTICLE SURGERY  1988   VARICOCELE   TONSILLECTOMY     VARICOSE VEIN SURGERY Bilateral    Patient Active Problem List   Diagnosis Date Noted   Pancreatic cyst 09/30/2022   Infection due to novel influenza A virus 09/29/2022   Persistent atrial fibrillation (HCC) 07/15/2022   Acute on chronic diastolic heart failure (HCC) 07/03/2022   Pulmonary infiltrates 07/01/2022   Acute respiratory failure with hypoxia (HCC) 07/01/2022   Hyponatremia 06/29/2022   History of thoracic surgery  06/29/2022   Acute DVT of left tibial vein (HCC) 06/21/2022   Hypokalemia 06/21/2022   Vitamin D deficiency 05/30/2022   Other constipation 05/29/2022   Pathologic compression fracture of spine, initial encounter (HCC) 05/29/2022   Cord compression (HCC) 05/28/2022   Degenerative cervical disc 05/14/2022   Hypercoagulable state due to persistent atrial fibrillation (HCC) 01/11/2022   Multiple myeloma (HCC) 12/10/2021   Medication monitoring encounter 02/27/2021   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   Status post peripherally inserted central catheter (PICC) central line placement 11/06/2020   Drug rash with eosinophilia and systemic symptoms syndrome 11/06/2020   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   PAF (paroxysmal atrial fibrillation) (HCC)    GERD (gastroesophageal reflux disease) 03/29/2018   Hiatal hernia 03/29/2018   History of DVT (deep vein thrombosis) 03/29/2018   CAP (community acquired pneumonia) 03/29/2018   Osteoarthritis 03/29/2018   Neuropathy 08/29/2017   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   Paroxysmal atrial fibrillation with rapid ventricular response (HCC) 04/05/2017   Prolapsed internal hemorrhoids, grade 4, s/p ligation/pexy/hemorrhoidectomy x 2 02/24/2017 02/24/2017   OSA (obstructive sleep apnea) 07/26/2016   Obesity (BMI 30-39.9) 07/26/2016   Atrial fibrillation with RVR (HCC)    Essential hypertension    Chest pain at rest     PCP: Eleanora Neighbor, MD  REFERRING PROVIDER: Antony Blackbird, MD  REFERRING DIAG: Multiple Myeloma  THERAPY DIAG:  Compression fracture of thoracic vertebra, unspecified thoracic vertebral level, initial encounter (HCC)  Multiple myeloma not having achieved remission (HCC)  Muscle weakness (generalized)  Abnormal posture  Neck pain  ONSET DATE: 2023  Rationale for Evaluation and Treatment: Rehabilitation  SUBJECTIVE:                                                                                                                                                                                            SUBJECTIVE STATEMENT: Saturday was a real bad day. Everything was off, balance, strength, chest tightness. I got to aquatic therapy an hour early accidentally so did my own  exercises before I worked with Britt Boozer so I think I just overdid it. I am noticing I can get my head up further now and can hold it there for longer periods as well.    PERTINENT HISTORY:  Travis [name] 72 y.o. male with medical history significant for IgA Kappa Multiple Myeloma. He was diagnosed with smouldering myeloma in 2018 . Last July he developed right arm pain due to a pathologic fx and he underwent radiation and fixation. In early Feb 2024 he had radiation for pathologic T2 compression fx followed by a T1-T4 fusion. PAIN:  Are you having pain? No, same neck tightness and soreness at between shoulder blades     PRECAUTIONS: Multiple Myeloma with mets to R ribs, R humerus with ORIF for pathological fracture 12/17/21. Lesions have also been found in R rib and also in his skull. Pathologic fx T2 with fusion T1-4 on 06/04/22, Bilateral LE edema (on Lasix), Afib   WEIGHT BEARING RESTRICTIONS: No  FALLS:  Has patient fallen in last 6 months? Yes. Number of falls 1  LIVING ENVIRONMENT: Lives with: lives with their family and lives with their spouse Lives in: House/apartment Stairs: Yes; Internal: 14 steps; on left going up and External: 3 steps; on right going up Has following equipment at home: Single point cane and Walker - 4 wheeled  OCCUPATION: attorney/ mostly retired?  LEISURE: golf (can't do now.), use to play racquetball  HAND DOMINANCE: right   PRIOR LEVEL OF FUNCTION: Independent  POSTURE:significant slump sitting posture with forward head  PATIENT GOALS: decrease pain,get stronger,improve balance   OBJECTIVE:  COGNITION: Overall cognitive status: Within functional limits for tasks  assessed   PALPATION: Very tender bilateral UT/levator, scapular regions  OBSERVATIONS / OTHER ASSESSMENTS: very flexed head and neck with chin nearly at chest  SENSATION: Light touch: Appears intact    POSTURE: significant forward head, round shoulders, increased kyphosis   Gait holding single point cane in left hand but not using. Right shoulder low, flexed neck;difficulty raising head to see where he is going  UPPER EXTREMITY AROM/PROM:  A/PROM RIGHT   eval  RIGHT 11/08/2022  Shoulder extension    Shoulder flexion 91 with compensation 101 with compensation   Shoulder abduction 67 with compensation 75 with compensation  Shoulder internal rotation    Shoulder external rotation C6     (Blank rows = not tested)  A/PROM LEFT   eval  Shoulder extension   Shoulder flexion 123  Shoulder abduction 112  Shoulder internal rotation T7  Shoulder external rotation t3    (Blank rows = not tested)  CERVICAL AROM: All within normal limits:    Percent limited 11/08/22  Flexion Fully flexed   Extension From flexed position decreased 80% Decreased 70%  Right lateral flexion    Left lateral flexion    Right rotation Dec 50% sitting in FH posture   Left rotation        Dec 50%     UPPER EXTREMITY STRENGTH:    LOWER EXTREMITY AROM/PROM:  A/PROM Right eval  Hip flexion   Hip extension   Hip abduction   Hip adduction   Hip internal rotation   Hip external rotation   Knee flexion   Knee extension   Ankle dorsiflexion   Ankle plantarflexion   Ankle inversion   Ankle eversion    (Blank rows = not tested)  A/PROM LEFT eval  Hip flexion   Hip extension   Hip abduction   Hip adduction  Hip internal rotation   Hip external rotation   Knee flexion   Knee extension   Ankle dorsiflexion   Ankle plantarflexion   Ankle inversion   Ankle eversion    (Blank rows = not tested)  LOWER EXTREMITY MMT:    10/04/2022  4 position balance test; able to hold DLS feet together  , and half tandem stance 10 seconds. Unable to maintain full tandem stance for 10 sec, unable to hold SLS greater than a second or 2 on left, and 4 seconds on right.     LYMPHEDEMA ASSESSMENTS:   SURGERY TYPE/DATE: 8/17/23Left prox humerus fixation for pathologic fx  2/2/2024Fusion T1-T4 for pathologic fx with cord compression 06/04/22 posterior cervical fusion/foraminotomy;T1-4 fusion NUMBER OF LYMPH NODES REMOVED: NA  CHEMOTHERAPY: Velcade/promalyst/Dex;  RADIATION:to prox humerus and thoracic spine pathologic fx  HORMONE TREATMENT: NA  INFECTIONS: NA  FUNCTIONAL TESTS:  10/09/2022;30 second sit to stand 10 with heavy use of arms  GAIT: Distance walked: 20 Assistive device utilized: carried SPC in left Level of assistance: Complete Independence Comments: feels unsteady, very flexed neck posture   QUICK DASH SURVEY:    TODAY'S TREATMENT:                                                                                                                                         DATE:   12/06/22: Therapeutic Exercises and Neuro Re Ed In // bars: 6" step ups x 10 each; SpO2 94% and HR 60 bpm Nu step seat 12, UE 8, lev 4 x 8 min, 660 steps, 96% O2 , HR 58 bpm In // bars for following: Mini Squats 2 x 10; controlled high knee marching x 4 length of bars; standing on blue oval for 3 way hip raises with VC's for head posture throughout 2 x 10 each; between sets SpO2 97% and HR 59-60 bpm then HR 56 bpm after second set. Green theraband for bil scap retract x 20 then bil UE ext x 20, next attempted bil er and orz abd with back against wall but Rt shoulder pain and compensations so stopped  Wall Push Ups x 20 with good technique, seated rest SpO2 97% and HR 59-60 bpm, RPE 3 Stand in corner of wall on Airex for ball toss in multiple directions. Minor LOB that pt was able to self correct with use of wall Manual Therapy STM seated in chair with cocoa butter to bil UT/upper scapular regions, and  cervical and thoracic paraspinals where pt palpably very tight.  12/03/22:Pt seen for aquatic therapy today.  Treatment took place in water 3.5-4.75 ft in depth at the [facility] pool. Temp of water was 91.  Pt entered/exited the pool via stairs with bilat rail independently. Pt requires buoyancy of water for support and to offload joints with strengthening exercises.Pt utilizes viscosity of the water required for strengthening.   Seated water bench with  75% submersion Pt performed seated LE AROM exercises 20x in all planes, with concurrent discussion of his current health status and pain assessment. 75% depth water walking with single buoy UE wts for push/pull 10 lengths. 3 way stance: parallel, tandem both sides with hand paddles: 10x2 bil arms forward & backwards. Hip 3 way kicks 15x Bil holding onto wall for balance with VC to push water a bit faster.  Horizontal float with concurrent skin rolling on upper back and lateral sway for trunk stretching by PTA.   11/29/22: Therapeutic Exercises and Neuro Re Ed In // bars: 6" step ups x 10 each; after SpO2 97%, HR 71 bpm  Nu step seat 12, UE 8, lev 4 x 8 min, 710 steps, 97% O2 , HR 90-95 bpm In // bars for following: Mini Squats 2 x 10; slow, controlled high knee marching x 3 length of bars; standing on blue oval for 3 way hip raises x 10 each returning therapist demo for each and tactile cues to keep Rt knee extended with ext; short seated rest to check vitals SpO2 98% and HR 90-99 BPM after Green theraband (red was too easy) for bil scap retract x 20 then bil UE ext x 20, seated rest after   Wall Push Ups 2 x 10, pt reports liking these and plans to add them to his HEP Manual Therapy STM seated in chair with cocoa butter to bil UT/upper scapular regions, and cervical and thoracic paraspinals where pt palpably very tight.   11/24/22:Pt seen for aquatic therapy today.  Treatment took place in water 3.5-4.75 ft in depth at the [facility] pool. Temp of water was 90.  Pt entered/exited the pool via stairs with bilat rail independently. Pt requires buoyancy of water for support and to offload joints with strengthening exercises.  Seated water bench with 75% submersion Pt performed seated LE AROM exercises 20x in all planes, with concurrent discussion of his current health status and pain assessment. 75% depth water walking with single buoy UE wts for push/pull 8 lengths. 3 way stance: parallel, tandem both sides with hand paddles: 10x2 bil arms forward & backwards. Hip 3 way kicks 10x Bil holding onto wall for balance.Bil heel raises 15x no UE support needed in water. 10 min horizontal float with concurrent skin rolling on upper back.    PATIENT EDUCATION:  Education details: sitting posture with lumbar roll Person educated: Patient Education method: Medical illustrator Education comprehension: verbalized understanding and returned demonstration  HOME EXERCISE PROGRAM: Doing HEP from HHPT; cervical ROM, shoulder rolls, shrugs, scap retraction, UT stretches  Aquatic Access Code: [email or web address] URL: [email or web address] Date: 11/09/2022 Prepared by: [facility] - [address] This aquatic home exercise program from MedBridge utilizes pictures from land based exercises, but has been adapted prior to lamination and issuance.    ASSESSMENT:  CLINICAL IMPRESSION: Pt reports having a rough Saturday after aquatic therapy but was feeling better today. He sees his cardiologist this week. Pt is able to work continuously when he is here with only 1 seated rest break but HR did not get higher than 60 bpm after any increased activity today so he will discuss this with his cardiologist at upcoming appt. Also let him know that if needed his cardiologist can see our notes to know his HR with activity.   OBJECTIVE IMPAIRMENTS: decreased activity tolerance, decreased balance, difficulty walking,  decreased ROM, decreased strength, increased edema, impaired UE functional use, postural dysfunction, and pain.  ACTIVITY LIMITATIONS: carrying, lifting, dressing, reach over head, and locomotion level  PARTICIPATION LIMITATIONS: cleaning, laundry, driving, and occupation  PERSONAL FACTORS:  Multiple Myeloma  are also affecting patient's functional outcome.   REHAB POTENTIAL: Good  CLINICAL DECISION MAKING: Evolving/moderate complexity  EVALUATION COMPLEXITY: Moderate  GOALS: Goals reviewed with patient? Yes  SHORT TERM GOALS: Target date: 10/21/22  Pt will be educated in and independent in proper sitting posture using lumbar roll prn Baseline: Goal status; MET 11/08/22 2.  Pt will report decreased muscle soreness in bilateral upper back x 25% Baseline:  Goal status: ONGOING  3.  Pt will be compliant with a HEP for postural strengthening/ROM Baseline:  Goal status: MET / LONG TERM GOALS: Target date: 12/20/22  Pt will be able to bring his head to neutral position for safety with ambulation/driving Baseline:  Goal status:Ongoing 2.  Pt will have decreased upper back pain by 50% or more Baseline:  Goal status:Ongoing 3.  Pt will be able to flex right shoulder to 115 degrees with minimal compensation for improved reaching ability Baseline:  Goal status: ONGOING 4; Pt will be able to maintain tandem stance on both sides x 10 sec to demonstrated improved balance. Goal status:MET right foot forward 11/08/2022, Ongoing left foot forward  5. Pt will be able to perform sit to stand x 5 without use of Ue's  Baseline:must use hands  Goal status:New  PT FREQUENCY: 2x/week  PT DURATION: 6 weeks  PLANNED INTERVENTIONS: Therapeutic exercises, Therapeutic activity, Neuromuscular re-education, Balance training, Gait training, Patient/Family education, Self Care, Joint mobilization, Aquatic Therapy, Manual therapy, and Re-evaluation  PLAN FOR NEXT SESSION: Cont land and aquatic  exercises working on posture, strength, balance, STM prn for pain  Berna Spare, PTA 12/06/22 12:22 PM

## 2022-12-08 ENCOUNTER — Inpatient Hospital Stay: Payer: Medicare Other

## 2022-12-08 ENCOUNTER — Other Ambulatory Visit: Payer: Self-pay | Admitting: Physician Assistant

## 2022-12-08 ENCOUNTER — Other Ambulatory Visit: Payer: Self-pay

## 2022-12-08 ENCOUNTER — Ambulatory Visit: Payer: Medicare Other | Admitting: Physical Therapy

## 2022-12-08 ENCOUNTER — Encounter: Payer: Self-pay | Admitting: Physical Therapy

## 2022-12-08 VITALS — BP 161/71 | HR 55 | Temp 98.1°F | Wt 260.0 lb

## 2022-12-08 DIAGNOSIS — Z881 Allergy status to other antibiotic agents status: Secondary | ICD-10-CM | POA: Diagnosis not present

## 2022-12-08 DIAGNOSIS — M48061 Spinal stenosis, lumbar region without neurogenic claudication: Secondary | ICD-10-CM | POA: Diagnosis not present

## 2022-12-08 DIAGNOSIS — R1310 Dysphagia, unspecified: Secondary | ICD-10-CM | POA: Diagnosis not present

## 2022-12-08 DIAGNOSIS — Z7961 Long term (current) use of immunomodulator: Secondary | ICD-10-CM | POA: Diagnosis not present

## 2022-12-08 DIAGNOSIS — Z79899 Other long term (current) drug therapy: Secondary | ICD-10-CM | POA: Diagnosis not present

## 2022-12-08 DIAGNOSIS — C9000 Multiple myeloma not having achieved remission: Secondary | ICD-10-CM

## 2022-12-08 DIAGNOSIS — Z923 Personal history of irradiation: Secondary | ICD-10-CM | POA: Diagnosis not present

## 2022-12-08 DIAGNOSIS — Z86718 Personal history of other venous thrombosis and embolism: Secondary | ICD-10-CM | POA: Diagnosis not present

## 2022-12-08 DIAGNOSIS — M542 Cervicalgia: Secondary | ICD-10-CM | POA: Diagnosis not present

## 2022-12-08 DIAGNOSIS — Z7952 Long term (current) use of systemic steroids: Secondary | ICD-10-CM | POA: Diagnosis not present

## 2022-12-08 DIAGNOSIS — Z8249 Family history of ischemic heart disease and other diseases of the circulatory system: Secondary | ICD-10-CM | POA: Diagnosis not present

## 2022-12-08 DIAGNOSIS — M533 Sacrococcygeal disorders, not elsewhere classified: Secondary | ICD-10-CM | POA: Diagnosis not present

## 2022-12-08 DIAGNOSIS — G8929 Other chronic pain: Secondary | ICD-10-CM

## 2022-12-08 DIAGNOSIS — R293 Abnormal posture: Secondary | ICD-10-CM

## 2022-12-08 DIAGNOSIS — I10 Essential (primary) hypertension: Secondary | ICD-10-CM | POA: Diagnosis not present

## 2022-12-08 DIAGNOSIS — I480 Paroxysmal atrial fibrillation: Secondary | ICD-10-CM | POA: Diagnosis not present

## 2022-12-08 DIAGNOSIS — M6281 Muscle weakness (generalized): Secondary | ICD-10-CM | POA: Diagnosis not present

## 2022-12-08 DIAGNOSIS — I5033 Acute on chronic diastolic (congestive) heart failure: Secondary | ICD-10-CM | POA: Diagnosis not present

## 2022-12-08 DIAGNOSIS — G4733 Obstructive sleep apnea (adult) (pediatric): Secondary | ICD-10-CM | POA: Diagnosis not present

## 2022-12-08 DIAGNOSIS — Z9089 Acquired absence of other organs: Secondary | ICD-10-CM | POA: Diagnosis not present

## 2022-12-08 DIAGNOSIS — Z823 Family history of stroke: Secondary | ICD-10-CM | POA: Diagnosis not present

## 2022-12-08 DIAGNOSIS — S22000A Wedge compression fracture of unspecified thoracic vertebra, initial encounter for closed fracture: Secondary | ICD-10-CM

## 2022-12-08 DIAGNOSIS — M7989 Other specified soft tissue disorders: Secondary | ICD-10-CM | POA: Diagnosis not present

## 2022-12-08 DIAGNOSIS — Z7901 Long term (current) use of anticoagulants: Secondary | ICD-10-CM | POA: Diagnosis not present

## 2022-12-08 DIAGNOSIS — E876 Hypokalemia: Secondary | ICD-10-CM | POA: Diagnosis not present

## 2022-12-08 DIAGNOSIS — R278 Other lack of coordination: Secondary | ICD-10-CM

## 2022-12-08 DIAGNOSIS — M25511 Pain in right shoulder: Secondary | ICD-10-CM | POA: Diagnosis not present

## 2022-12-08 DIAGNOSIS — M5126 Other intervertebral disc displacement, lumbar region: Secondary | ICD-10-CM | POA: Diagnosis not present

## 2022-12-08 LAB — CMP (CANCER CENTER ONLY)
ALT: 24 U/L (ref 0–44)
AST: 16 U/L (ref 15–41)
Albumin: 4 g/dL (ref 3.5–5.0)
Alkaline Phosphatase: 77 U/L (ref 38–126)
Anion gap: 7 (ref 5–15)
BUN: 22 mg/dL (ref 8–23)
CO2: 27 mmol/L (ref 22–32)
Calcium: 9 mg/dL (ref 8.9–10.3)
Chloride: 110 mmol/L (ref 98–111)
Creatinine: 0.83 mg/dL (ref 0.61–1.24)
GFR, Estimated: >60 mL/min
Glucose, Bld: 111 mg/dL — ABNORMAL HIGH (ref 70–99)
Potassium: 3.7 mmol/L (ref 3.5–5.1)
Sodium: 144 mmol/L (ref 135–145)
Total Bilirubin: 0.5 mg/dL (ref 0.3–1.2)
Total Protein: 6.1 g/dL — ABNORMAL LOW (ref 6.5–8.1)

## 2022-12-08 LAB — CBC WITH DIFFERENTIAL (CANCER CENTER ONLY)
Abs Immature Granulocytes: 0.03 K/uL (ref 0.00–0.07)
Basophils Absolute: 0.1 K/uL (ref 0.0–0.1)
Basophils Relative: 1 %
Eosinophils Absolute: 0 K/uL (ref 0.0–0.5)
Eosinophils Relative: 1 %
HCT: 34.1 % — ABNORMAL LOW (ref 39.0–52.0)
Hemoglobin: 11.2 g/dL — ABNORMAL LOW (ref 13.0–17.0)
Immature Granulocytes: 0 %
Lymphocytes Relative: 4 %
Lymphs Abs: 0.3 K/uL — ABNORMAL LOW (ref 0.7–4.0)
MCH: 30.9 pg (ref 26.0–34.0)
MCHC: 32.8 g/dL (ref 30.0–36.0)
MCV: 93.9 fL (ref 80.0–100.0)
Monocytes Absolute: 0.5 K/uL (ref 0.1–1.0)
Monocytes Relative: 7 %
Neutro Abs: 6 K/uL (ref 1.7–7.7)
Neutrophils Relative %: 87 %
Platelet Count: 191 K/uL (ref 150–400)
RBC: 3.63 MIL/uL — ABNORMAL LOW (ref 4.22–5.81)
RDW: 17.8 % — ABNORMAL HIGH (ref 11.5–15.5)
WBC Count: 6.9 K/uL (ref 4.0–10.5)
nRBC: 0 % (ref 0.0–0.2)

## 2022-12-08 LAB — BRAIN NATRIURETIC PEPTIDE: B Natriuretic Peptide: 452.6 pg/mL — ABNORMAL HIGH (ref 0.0–100.0)

## 2022-12-08 MED ORDER — BORTEZOMIB CHEMO SQ INJECTION 3.5 MG (2.5MG/ML)
1.3000 mg/m2 | Freq: Once | INTRAMUSCULAR | Status: AC
  Administered 2022-12-08: 3 mg via SUBCUTANEOUS
  Filled 2022-12-08: qty 1.2

## 2022-12-08 NOTE — Therapy (Signed)
OUTPATIENT PHYSICAL THERAPY ONCOLOGY TREATMENT  Patient Name: Travis [name] MRN: [id number] DOB:06-06-1950, 72 y.o., male Today's Date: 12/08/2022   END OF SESSION:  PT End of Session - 12/08/22 1207     Visit Number 20    Number of Visits 23    Date for PT Re-Evaluation 12/20/22    PT Start Time 1015    PT Stop Time 1100    PT Time Calculation (min) 45 min    Activity Tolerance Patient tolerated treatment well    Behavior During Therapy [facility] for tasks assessed/performed                  Past Medical History:  Diagnosis Date   A-fib [facility])    Arthritis    "comes and goes; mostly in hands, occasionally elbow" (04/05/2017)   Complication of anesthesia    "just wanted to sleep alot  for couple days S/P VARICOCELE OR" (04/05/2017)   DVT (deep venous thrombosis) (HCC)    LLE   Dysrhythmia    PVCs    GERD (gastroesophageal reflux disease)    History of hiatal hernia    History of radiation therapy    Thoracic Spine- 07/13/22-07/23/22-Dr. Antony Blackbird   Hypertension    Impaired glucose tolerance    Multiple myeloma (HCC)    Obesity (BMI 30-39.9) 07/26/2016   OSA on CPAP 07/26/2016   Mild with AHI 12.5/hr now on CPAP at 14cm H2O   PAF (paroxysmal atrial fibrillation) (HCC)    s/p PVI Ablation in 2018 // Flecainide Rx // Echo 8/21: EF 60-65, no RWMA, mild LVH, normal RV SF, moderate RVE, mild BAE, trivial MR, mild dilation of aortic root (40 mm)   Pneumonia    "may have had walking pneumonia a few years ago" (04/05/2017)   Pre-diabetes    Thrombophlebitis    Past Surgical History:  Procedure Laterality Date   ATRIAL FIBRILLATION ABLATION  04/05/2017   ATRIAL FIBRILLATION ABLATION N/A 04/05/2017   Procedure: ATRIAL FIBRILLATION ABLATION;  Surgeon: [name], MD;  [facility];  Service: Cardiovascular;  Laterality: N/A;   BONE BIOPSY Right 12/17/2021   Procedure: BONE BIOPSY;  Surgeon: [name], MD;  Location: [facility];   Service: Orthopedics;  Laterality: Right;   COMPLETE RIGHT HIP REPLACMENT  01/19/2019   EVALUATION UNDER ANESTHESIA WITH HEMORRHOIDECTOMY N/A 02/24/2017   Procedure: EXAM UNDER ANESTHESIA WITH  HEMORRHOIDECTOMY;  Surgeon: [name], MD;  Location: [facility];  Service: General;  Laterality: N/A;   EXCISIONAL TOTAL HIP ARTHROPLASTY WITH ANTIBIOTIC SPACERS Right 09/25/2020   Procedure: EXCISIONAL TOTAL HIP ARTHROPLASTY WITH ANTIBIOTIC SPACERS;  Surgeon: [name], MD;  Location: [facility];  Service: Orthopedics;  Laterality: Right;   FLEXIBLE SIGMOIDOSCOPY N/A 04/15/2015   Procedure:  UNSEDATED FLEXIBLE SIGMOIDOSCOPY;  Surgeon: [name], MD;  [facility]: [facility];  Service: Endoscopy;  Laterality: N/A;   HUMERUS IM NAIL Right 12/17/2021   Procedure: INTRAMEDULLARY (IM) NAIL HUMERAL;  Surgeon: [name], MD;  Location: [facility];  Service: Orthopedics;  Laterality: Right;   KNEE ARTHROSCOPY Left ~ 2016   POSTERIOR CERVICAL FUSION/FORAMINOTOMY N/A 06/04/2022   Procedure: Thoracic one - Thoracic Four Posterior lateral arthrodesis/ fusion and Thoracic two Corpectomy;  Surgeon: [name], MD;  Location: [facility];  Service: Neurosurgery;  Laterality: N/A;   REIMPLANTATION OF TOTAL HIP Right 12/25/2020   Procedure: REIMPLANTATION/REVISION OF RIGHT TOTAL HIP VERSUS REPEAT IRRIGATION AND DEBRIDEMENT;  Surgeon: [name], MD;  Location: [facility];  Service: Orthopedics;  Laterality: Right;   TESTICLE SURGERY  1988   VARICOCELE   TONSILLECTOMY     VARICOSE VEIN SURGERY Bilateral    Patient Active Problem List   Diagnosis Date Noted   Pancreatic cyst 09/30/2022   Infection due to novel influenza A virus 09/29/2022   Persistent atrial fibrillation (HCC) 07/15/2022   Acute on chronic diastolic heart failure (HCC) 07/03/2022   Pulmonary infiltrates 07/01/2022   Acute respiratory failure with hypoxia (HCC) 07/01/2022   Hyponatremia 06/29/2022   History of thoracic surgery  06/29/2022   Acute DVT of left tibial vein (HCC) 06/21/2022   Hypokalemia 06/21/2022   Vitamin D deficiency 05/30/2022   Other constipation 05/29/2022   Pathologic compression fracture of spine, initial encounter (HCC) 05/29/2022   Cord compression (HCC) 05/28/2022   Degenerative cervical disc 05/14/2022   Hypercoagulable state due to persistent atrial fibrillation (HCC) 01/11/2022   Multiple myeloma (HCC) 12/10/2021   Medication monitoring encounter 02/27/2021   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   Status post peripherally inserted central catheter (PICC) central line placement 11/06/2020   Drug rash with eosinophilia and systemic symptoms syndrome 11/06/2020   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   PAF (paroxysmal atrial fibrillation) (HCC)    GERD (gastroesophageal reflux disease) 03/29/2018   Hiatal hernia 03/29/2018   History of DVT (deep vein thrombosis) 03/29/2018   CAP (community acquired pneumonia) 03/29/2018   Osteoarthritis 03/29/2018   Neuropathy 08/29/2017   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   Paroxysmal atrial fibrillation with rapid ventricular response (HCC) 04/05/2017   Prolapsed internal hemorrhoids, grade 4, s/p ligation/pexy/hemorrhoidectomy x 2 02/24/2017 02/24/2017   OSA (obstructive sleep apnea) 07/26/2016   Obesity (BMI 30-39.9) 07/26/2016   Atrial fibrillation with RVR (HCC)    Essential hypertension    Chest pain at rest     PCP: Eleanora Neighbor, MD  REFERRING PROVIDER: Antony Blackbird, MD  REFERRING DIAG: Multiple Myeloma  THERAPY DIAG:  Compression fracture of thoracic vertebra, unspecified thoracic vertebral level, initial encounter (HCC)  Multiple myeloma not having achieved remission (HCC)  Muscle weakness (generalized)  Abnormal posture  Neck pain  Other lack of coordination  Chronic right shoulder pain  ONSET DATE: 2023  Rationale for Evaluation and Treatment: Rehabilitation  SUBJECTIVE:                                                                                                                                                                                            SUBJECTIVE STATEMENT: Better by Monday but Im up another 3 pounds of fluid. Lt ankle has edema today.  No SOB currently. Not  able to lift head up as easily as I did last time I saw you.  PERTINENT HISTORY:  KENTRAIL BOLL 73 y.o. male with medical history significant for IgA Kappa Multiple Myeloma. He was diagnosed with smouldering myeloma in 2018 . Last July he developed right arm pain due to a pathologic fx and he underwent radiation and fixation. In early Feb 2024 he had radiation for pathologic T2 compression fx followed by a T1-T4 fusion. PAIN:  Are you having pain? No, same neck tightness and soreness at between shoulder blades     PRECAUTIONS: Multiple Myeloma with mets to R ribs, R humerus with ORIF for pathological fracture 12/17/21. Lesions have also been found in R rib and also in his skull. Pathologic fx T2 with fusion T1-4 on 06/04/22, Bilateral LE edema (on Lasix), Afib   WEIGHT BEARING RESTRICTIONS: No  FALLS:  Has patient fallen in last 6 months? Yes. Number of falls 1  LIVING ENVIRONMENT: Lives with: lives with their family and lives with their spouse Lives in: House/apartment Stairs: Yes; Internal: 14 steps; on left going up and External: 3 steps; on right going up Has following equipment at home: Single point cane and Walker - 4 wheeled  OCCUPATION: attorney/ mostly retired?  LEISURE: golf (can't do now.), use to play racquetball  HAND DOMINANCE: right   PRIOR LEVEL OF FUNCTION: Independent  POSTURE:significant slump sitting posture with forward head  PATIENT GOALS: decrease pain,get stronger,improve balance   OBJECTIVE:  COGNITION: Overall cognitive status: Within functional limits for tasks assessed   PALPATION: Very tender bilateral UT/levator, scapular regions  OBSERVATIONS /  OTHER ASSESSMENTS: very flexed head and neck with chin nearly at chest  SENSATION: Light touch: Appears intact    POSTURE: significant forward head, round shoulders, increased kyphosis   Gait holding single point cane in left hand but not using. Right shoulder low, flexed neck;difficulty raising head to see where he is going  UPPER EXTREMITY AROM/PROM:  A/PROM RIGHT   eval  RIGHT 11/08/2022  Shoulder extension    Shoulder flexion 91 with compensation 101 with compensation   Shoulder abduction 67 with compensation 75 with compensation  Shoulder internal rotation    Shoulder external rotation C6     (Blank rows = not tested)  A/PROM LEFT   eval  Shoulder extension   Shoulder flexion 123  Shoulder abduction 112  Shoulder internal rotation T7  Shoulder external rotation t3    (Blank rows = not tested)  CERVICAL AROM: All within normal limits:    Percent limited 11/08/22  Flexion Fully flexed   Extension From flexed position decreased 80% Decreased 70%  Right lateral flexion    Left lateral flexion    Right rotation Dec 50% sitting in FH posture   Left rotation        Dec 50%     UPPER EXTREMITY STRENGTH:    LOWER EXTREMITY AROM/PROM:  A/PROM Right eval  Hip flexion   Hip extension   Hip abduction   Hip adduction   Hip internal rotation   Hip external rotation   Knee flexion   Knee extension   Ankle dorsiflexion   Ankle plantarflexion   Ankle inversion   Ankle eversion    (Blank rows = not tested)  A/PROM LEFT eval  Hip flexion   Hip extension   Hip abduction   Hip adduction   Hip internal rotation   Hip external rotation   Knee flexion  Knee extension   Ankle dorsiflexion   Ankle plantarflexion   Ankle inversion   Ankle eversion    (Blank rows = not tested)  LOWER EXTREMITY MMT:    10/04/2022  4 position balance test; able to hold DLS feet together , and half tandem stance 10 seconds. Unable to maintain full tandem stance for 10 sec,  unable to hold SLS greater than a second or 2 on left, and 4 seconds on right.     LYMPHEDEMA ASSESSMENTS:   SURGERY TYPE/DATE: 8/17/23Left prox humerus fixation for pathologic fx  2/2/2024Fusion T1-T4 for pathologic fx with cord compression 06/04/22 posterior cervical fusion/foraminotomy;T1-4 fusion NUMBER OF LYMPH NODES REMOVED: NA  CHEMOTHERAPY: Velcade/promalyst/Dex;  RADIATION:to prox humerus and thoracic spine pathologic fx  HORMONE TREATMENT: NA  INFECTIONS: NA  FUNCTIONAL TESTS:  10/09/2022;30 second sit to stand 10 with heavy use of arms  GAIT: Distance walked: 20 Assistive device utilized: carried SPC in left Level of assistance: Complete Independence Comments: feels unsteady, very flexed neck posture   QUICK DASH SURVEY:    TODAY'S TREATMENT:                                                                                                                                         DATE:   12/08/22:Pt seen for aquatic therapy today.  Treatment took place in water 3.5-4.75 ft in depth at the [facility] pool. Temp of water was 91.  Pt entered/exited the pool via stairs with bilat rail independently. Pt requires buoyancy of water for support and to offload joints with strengthening exercises.Pt utilizes viscosity of the water required for strengthening.   Seated water bench with 75% submersion Pt performed seated LE AROM exercises 20x in all planes, with concurrent discussion of his current health status and pain assessment. 75% depth water walking with single buoy UE wts for push/pull 10 lengths. 3 way stance: parallel, tandem both sides with hand paddles: 10x2 bil arms forward & backwards. Hip 3 way kicks 15x Bil holding onto wall for balance with VC to push water a bit faster.  Single leg press with small noodle 2x10 Bil with balance assist. Horizontal float with concurrent skin rolling on upper back and lateral sway for trunk stretching by  PTA.   12/06/22: Therapeutic Exercises and Neuro Re Ed In // bars: 6" step ups x 10 each; SpO2 94% and HR 60 bpm Nu step seat 12, UE 8, lev 4 x 8 min, 660 steps, 96% O2 , HR 58 bpm In // bars for following: Mini Squats 2 x 10; controlled high knee marching x 4 length of bars; standing on blue oval for 3 way hip raises with VC's for head posture throughout 2 x 10 each; between sets SpO2 97% and HR 59-60 bpm then HR 56 bpm after second set. Green theraband for bil scap retract x 20 then bil UE ext x 20, next  attempted bil er and orz abd with back against wall but Rt shoulder pain and compensations so stopped  Wall Push Ups x 20 with good technique, seated rest SpO2 97% and HR 59-60 bpm, RPE 3 Stand in corner of wall on Airex for ball toss in multiple directions. Minor LOB that pt was able to self correct with use of wall Manual Therapy STM seated in chair with cocoa butter to bil UT/upper scapular regions, and cervical and thoracic paraspinals where pt palpably very tight.  12/03/22:Pt seen for aquatic therapy today.  Treatment took place in water 3.5-4.75 ft in depth at the [facility] pool. Temp of water was 91.  Pt entered/exited the pool via stairs with bilat rail independently. Pt requires buoyancy of water for support and to offload joints with strengthening exercises.Pt utilizes viscosity of the water required for strengthening.   Seated water bench with 75% submersion Pt performed seated LE AROM exercises 20x in all planes, with concurrent discussion of his current health status and pain assessment. 75% depth water walking with single buoy UE wts for push/pull 10 lengths. 3 way stance: parallel, tandem both sides with hand paddles: 10x2 bil arms forward & backwards. Hip 3 way kicks 15x Bil holding onto wall for balance with VC to push water a bit faster.  Horizontal float with concurrent skin rolling on upper back and lateral sway for trunk stretching by PTA.   11/29/22: Therapeutic  Exercises and Neuro Re Ed In // bars: 6" step ups x 10 each; after SpO2 97%, HR 71 bpm  Nu step seat 12, UE 8, lev 4 x 8 min, 710 steps, 97% O2 , HR 90-95 bpm In // bars for following: Mini Squats 2 x 10; slow, controlled high knee marching x 3 length of bars; standing on blue oval for 3 way hip raises x 10 each returning therapist demo for each and tactile cues to keep Rt knee extended with ext; short seated rest to check vitals SpO2 98% and HR 90-99 BPM after Green theraband (red was too easy) for bil scap retract x 20 then bil UE ext x 20, seated rest after   Wall Push Ups 2 x 10, pt reports liking these and plans to add them to his HEP Manual Therapy STM seated in chair with cocoa butter to bil UT/upper scapular regions, and cervical and thoracic paraspinals where pt palpably very tight.   PATIENT EDUCATION:  Education details: sitting posture with lumbar roll Person educated: Patient Education method: Medical illustrator Education comprehension: verbalized understanding and returned demonstration  HOME EXERCISE PROGRAM: Doing HEP from HHPT; cervical ROM, shoulder rolls, shrugs, scap retraction, UT stretches  Aquatic Access Code: [email or web address] URL: [email or web address] Date: 11/09/2022 Prepared by: [facility] - [address] This aquatic home exercise program from MedBridge utilizes pictures from land based exercises, but has been adapted prior to lamination and issuance.    ASSESSMENT:  CLINICAL IMPRESSION: Pt continues to struggle with fluid retention despite medication. He sees his cardiologist next Tuesday for follow up. Today was a a good day for pt, not too much soreness, balance was good, breathing was also good. Pt was not able to lift his chin/head as high or as easily as he did at this last aquatic appt.   OBJECTIVE IMPAIRMENTS: decreased activity tolerance, decreased balance, difficulty walking, decreased ROM, decreased strength,  increased edema, impaired UE functional use, postural dysfunction, and pain.   ACTIVITY LIMITATIONS: carrying, lifting, dressing, reach over head, and locomotion level  PARTICIPATION LIMITATIONS: cleaning, laundry, driving, and occupation  PERSONAL FACTORS:  Multiple Myeloma  are also affecting patient's functional outcome.   REHAB POTENTIAL: Good  CLINICAL DECISION MAKING: Evolving/moderate complexity  EVALUATION COMPLEXITY: Moderate  GOALS: Goals reviewed with patient? Yes  SHORT TERM GOALS: Target date: 10/21/22  Pt will be educated in and independent in proper sitting posture using lumbar roll prn Baseline: Goal status; MET 11/08/22 2.  Pt will report decreased muscle soreness in bilateral upper back x 25% Baseline:  Goal status: ONGOING  3.  Pt will be compliant with a HEP for postural strengthening/ROM Baseline:  Goal status: MET / LONG TERM GOALS: Target date: 12/20/22  Pt will be able to bring his head to neutral position for safety with ambulation/driving Baseline:  Goal status:Ongoing 2.  Pt will have decreased upper back pain by 50% or more Baseline:  Goal status:Ongoing 3.  Pt will be able to flex right shoulder to 115 degrees with minimal compensation for improved reaching ability Baseline:  Goal status: ONGOING 4; Pt will be able to maintain tandem stance on both sides x 10 sec to demonstrated improved balance. Goal status:MET right foot forward 11/08/2022, Ongoing left foot forward  5. Pt will be able to perform sit to stand x 5 without use of Ue's  Baseline:must use hands  Goal status:New  PT FREQUENCY: 2x/week  PT DURATION: 6 weeks  PLANNED INTERVENTIONS: Therapeutic exercises, Therapeutic activity, Neuromuscular re-education, Balance training, Gait training, Patient/Family education, Self Care, Joint mobilization, Aquatic Therapy, Manual therapy, and Re-evaluation  PLAN FOR NEXT SESSION: Cont land and aquatic exercises working on posture, strength,  balance, STM prn for pain  Ane Payment, PTA 12/08/22 12:09 PM

## 2022-12-09 ENCOUNTER — Other Ambulatory Visit: Payer: Medicare Other

## 2022-12-09 ENCOUNTER — Ambulatory Visit: Payer: Medicare Other

## 2022-12-12 NOTE — Progress Notes (Unsigned)
a   Cardiology Office Note   Date:  12/16/2022   ID:  Quadre, Dabdoub [birth year], MRN [id number]  PCP:  [name], MD  Cardiologist:   [name], MD   Pt presents fore evaluation of edema   History of Present Illness: Travis [name] is a 73 y.o. male with a history of  HTN, OSA and PAF (CHADSVASc score 4; s/p Afib ablation 2018 ).   The pt had Cardiac CTA in 2018 which showed no CAD  Calcium score was 0  Pt also has hx of DVT, peripheral neuropathy and Multiple Myeloma   Fall 2023 he was hospitalized in [facility] for afib / flutter  Volume overloaded at time    Diuresed   Converted to SR  B blocker stopped due to bradycardia then resumed          I saw the pt in Dec 2023  Back in afib   Flecanide stopped and amiodarone started      BP was high  Hydrodiuril stopped due to low K   Recomm hydralazine 10 as tid Increased to 20mg   tid     Seen by [name] in Jan 2024  Recomm decreasing amiodarone dose to 200 daily    Increase hydralazine to 37.5 tid    \  The pt was hospitalized in march 2024 for pneumonia   IN afib  Treated with IV amiodarone then transitioned to po       I saw the pt in April 2024   Since then he has been seen by [name] and [name]  The pt presents today with increased LE edema and SOB      Says he does better with urinating when he is in afib   He has been in it intermittently      Allergies:   Linezolid and Vancomycin   Past Medical History:  Diagnosis Date   A-fib [facility])    Arthritis    "comes and goes; mostly in hands, occasionally elbow" (04/05/2017)   Complication of anesthesia    "just wanted to sleep alot  for couple days S/P VARICOCELE OR" (04/05/2017)   DVT (deep venous thrombosis) (HCC)    LLE   Dysrhythmia    PVCs    GERD (gastroesophageal reflux disease)    History of hiatal hernia    History of radiation therapy    Thoracic Spine- 07/13/22-07/23/22-Dr. Antony Blackbird   Hypertension    Impaired glucose tolerance    Multiple myeloma  (HCC)    Obesity (BMI 30-39.9) 07/26/2016   OSA on CPAP 07/26/2016   Mild with AHI 12.5/hr now on CPAP at 14cm H2O   PAF (paroxysmal atrial fibrillation) (HCC)    s/p PVI Ablation in 2018 // Flecainide Rx // Echo 8/21: EF 60-65, no RWMA, mild LVH, normal RV SF, moderate RVE, mild BAE, trivial MR, mild dilation of aortic root (40 mm)   Pneumonia    "may have had walking pneumonia a few years ago" (04/05/2017)   Pre-diabetes    Thrombophlebitis     Past Surgical History:  Procedure Laterality Date   ATRIAL FIBRILLATION ABLATION  04/05/2017   ATRIAL FIBRILLATION ABLATION N/A 04/05/2017   Procedure: ATRIAL FIBRILLATION ABLATION;  Surgeon: [name], MD;  [facility];  Service: Cardiovascular;  Laterality: N/A;   BONE BIOPSY Right 12/17/2021   Procedure: BONE BIOPSY;  Surgeon: [name], MD;  Location: [facility];  Service: Orthopedics;  Laterality: Right;   COMPLETE RIGHT HIP REPLACMENT  01/19/2019   EVALUATION UNDER ANESTHESIA WITH HEMORRHOIDECTOMY N/A 02/24/2017   Procedure: EXAM UNDER ANESTHESIA WITH  HEMORRHOIDECTOMY;  Surgeon: [name], MD;  Location: [facility];  Service: General;  Laterality: N/A;   EXCISIONAL TOTAL HIP ARTHROPLASTY WITH ANTIBIOTIC SPACERS Right 09/25/2020   Procedure: EXCISIONAL TOTAL HIP ARTHROPLASTY WITH ANTIBIOTIC SPACERS;  Surgeon: [name], MD;  Location: [facility];  Service: Orthopedics;  Laterality: Right;   FLEXIBLE SIGMOIDOSCOPY N/A 04/15/2015   Procedure:  UNSEDATED FLEXIBLE SIGMOIDOSCOPY;  Surgeon: [name], MD;  Location: [facility];  Service: Endoscopy;  Laterality: N/A;   HUMERUS IM NAIL Right 12/17/2021   Procedure: INTRAMEDULLARY (IM) NAIL HUMERAL;  Surgeon: [name], MD;  Location: [facility];  Service: Orthopedics;  Laterality: Right;   KNEE ARTHROSCOPY Left ~ 2016   POSTERIOR CERVICAL FUSION/FORAMINOTOMY N/A 06/04/2022   Procedure: Thoracic one - Thoracic Four Posterior lateral  arthrodesis/ fusion and Thoracic two Corpectomy;  Surgeon: [name], MD;  Location: [facility];  Service: Neurosurgery;  Laterality: N/A;   REIMPLANTATION OF TOTAL HIP Right 12/25/2020   Procedure: REIMPLANTATION/REVISION OF RIGHT TOTAL HIP VERSUS REPEAT IRRIGATION AND DEBRIDEMENT;  Surgeon: [name], MD;  Location: [facility];  Service: Orthopedics;  Laterality: Right;   TESTICLE SURGERY  1988   VARICOCELE   TONSILLECTOMY     VARICOSE VEIN SURGERY Bilateral      Social History:  The patient  reports that he has never smoked. He has never used smokeless tobacco. He reports current alcohol use of about 2.0 standard drinks of alcohol per week. He reports that he does not use drugs.   Family History:  The patient's family history includes Atrial fibrillation in his father; Dementia in his maternal grandmother and mother; Hypertension in his father and paternal grandfather; Stroke in his father.    ROS:  Please see the history of present illness. All other systems are reviewed and  Negative to the above problem except as noted.    PHYSICAL EXAM: VS:  BP (!) 156/82   Pulse (!) 52   Ht 5\' 10"  (1.778 m)   Wt 263 lb (119.3 kg)   SpO2 97%   BMI 37.74 kg/m   Pt examined in chair GEN: Obese 72 yo in no acute distress  HEENT: normal  Neck:  JVP is full  NO obvious JVD Cardiac: RRR; no murmur   1-2+ LE edema    Respiratory:  clear to auscultation  GI: Supple   Nontender  No hepatomegaly   EKG:  EKG is not ordered today   Lipid Panel No results found for: "CHOL", "TRIG", "HDL", "CHOLHDL", "VLDL", "LDLCALC", "LDLDIRECT"    Wt Readings from Last 3 Encounters:  12/15/22 246 lb 9.6 oz (111.9 kg)  12/14/22 263 lb (119.3 kg)  12/08/22 260 lb (117.9 kg)      ASSESSMENT AND PLAN:   Edema   WIll increase torsemide to 40 mg     Add Zaroxyln every other day Check BMET in 1 week    2  PAF  Still with some episodes of afib  CLinically in SR today.  Keep on amiodarone and Eliquis    3  HTN   BP is high  Follow as diurese   4  Hx of Bradycardia  R in 50s     5.  Lipids.  Excellent   LDL 61  HDL 46    6.  OSA.   Continue using  CPAP   Current medicines are reviewed at length with the patient today.  The patient does not have concerns regarding medicines.  Signed, [name], MD  12/16/2022 11:19 PM    [facility] [address] [place], [place], [place]  [address] Phone: [phone]; Fax: [phone]

## 2022-12-13 ENCOUNTER — Ambulatory Visit: Payer: Medicare Other | Admitting: Physician Assistant

## 2022-12-14 ENCOUNTER — Encounter: Payer: Self-pay | Admitting: Internal Medicine

## 2022-12-14 ENCOUNTER — Ambulatory Visit: Payer: Medicare Other | Attending: Internal Medicine | Admitting: Internal Medicine

## 2022-12-14 ENCOUNTER — Ambulatory Visit: Payer: Medicare Other

## 2022-12-14 VITALS — BP 156/82 | HR 52 | Ht 70.0 in | Wt 263.0 lb

## 2022-12-14 DIAGNOSIS — I480 Paroxysmal atrial fibrillation: Secondary | ICD-10-CM

## 2022-12-14 MED ORDER — TORSEMIDE 20 MG PO TABS: 40.0000 mg | ORAL_TABLET | Freq: Every day | ORAL | 3 refills | Status: DC

## 2022-12-14 NOTE — Patient Instructions (Addendum)
Medication Instructions:  Your physician has recommended you make the following change in your medication:  1.) change torsemide to TWO TABLETS (40 mg) daily 2.) metolazone (Zaroxolyn) 2.5 mg - one tablet every other day--take 30 min before torsemide dose  *If you need a refill on your cardiac medications before your next appointment, please call your pharmacy*   Lab Work: In one week labs (BMET) lab req given today  If you have labs (blood work) drawn today and your tests are completely normal, you will receive your results only by: MyChart Message (if you have MyChart) OR A paper copy in the mail If you have any lab test that is abnormal or we need to change your treatment, we will call you to review the results.   Testing/Procedures: none   Follow-Up: At [facility], you and your health needs are our priority.  As part of our continuing mission to provide you with exceptional heart care, we have created designated Provider Care Teams.  These Care Teams include your primary Cardiologist (physician) and Advanced Practice Providers (APPs -  Physician Assistants and Nurse Practitioners) who all work together to provide you with the care you need, when you need it.   Your next appointment:   2 months  Provider:   [name], MD

## 2022-12-15 ENCOUNTER — Inpatient Hospital Stay: Payer: Medicare Other | Admitting: Physician Assistant

## 2022-12-15 ENCOUNTER — Encounter: Payer: Self-pay | Admitting: Internal Medicine

## 2022-12-15 ENCOUNTER — Ambulatory Visit: Payer: Medicare Other | Admitting: Physical Therapy

## 2022-12-15 ENCOUNTER — Inpatient Hospital Stay: Payer: Medicare Other

## 2022-12-15 VITALS — BP 135/67 | HR 84 | Temp 98.4°F | Wt 246.6 lb

## 2022-12-15 DIAGNOSIS — C9000 Multiple myeloma not having achieved remission: Secondary | ICD-10-CM

## 2022-12-15 DIAGNOSIS — M533 Sacrococcygeal disorders, not elsewhere classified: Secondary | ICD-10-CM | POA: Diagnosis not present

## 2022-12-15 DIAGNOSIS — I5033 Acute on chronic diastolic (congestive) heart failure: Secondary | ICD-10-CM | POA: Diagnosis not present

## 2022-12-15 DIAGNOSIS — I480 Paroxysmal atrial fibrillation: Secondary | ICD-10-CM | POA: Diagnosis not present

## 2022-12-15 DIAGNOSIS — Z8249 Family history of ischemic heart disease and other diseases of the circulatory system: Secondary | ICD-10-CM | POA: Diagnosis not present

## 2022-12-15 DIAGNOSIS — Z79899 Other long term (current) drug therapy: Secondary | ICD-10-CM | POA: Diagnosis not present

## 2022-12-15 DIAGNOSIS — G4733 Obstructive sleep apnea (adult) (pediatric): Secondary | ICD-10-CM | POA: Diagnosis not present

## 2022-12-15 DIAGNOSIS — Z9089 Acquired absence of other organs: Secondary | ICD-10-CM | POA: Diagnosis not present

## 2022-12-15 DIAGNOSIS — E876 Hypokalemia: Secondary | ICD-10-CM | POA: Diagnosis not present

## 2022-12-15 DIAGNOSIS — R1310 Dysphagia, unspecified: Secondary | ICD-10-CM | POA: Diagnosis not present

## 2022-12-15 DIAGNOSIS — Z7901 Long term (current) use of anticoagulants: Secondary | ICD-10-CM | POA: Diagnosis not present

## 2022-12-15 DIAGNOSIS — Z823 Family history of stroke: Secondary | ICD-10-CM | POA: Diagnosis not present

## 2022-12-15 DIAGNOSIS — Z7961 Long term (current) use of immunomodulator: Secondary | ICD-10-CM | POA: Diagnosis not present

## 2022-12-15 DIAGNOSIS — Z7952 Long term (current) use of systemic steroids: Secondary | ICD-10-CM | POA: Diagnosis not present

## 2022-12-15 DIAGNOSIS — Z923 Personal history of irradiation: Secondary | ICD-10-CM | POA: Diagnosis not present

## 2022-12-15 DIAGNOSIS — M5126 Other intervertebral disc displacement, lumbar region: Secondary | ICD-10-CM | POA: Diagnosis not present

## 2022-12-15 DIAGNOSIS — M7989 Other specified soft tissue disorders: Secondary | ICD-10-CM | POA: Diagnosis not present

## 2022-12-15 DIAGNOSIS — Z86718 Personal history of other venous thrombosis and embolism: Secondary | ICD-10-CM | POA: Diagnosis not present

## 2022-12-15 DIAGNOSIS — M48061 Spinal stenosis, lumbar region without neurogenic claudication: Secondary | ICD-10-CM | POA: Diagnosis not present

## 2022-12-15 DIAGNOSIS — I10 Essential (primary) hypertension: Secondary | ICD-10-CM | POA: Diagnosis not present

## 2022-12-15 DIAGNOSIS — Z881 Allergy status to other antibiotic agents status: Secondary | ICD-10-CM | POA: Diagnosis not present

## 2022-12-15 LAB — CMP (CANCER CENTER ONLY)
ALT: 21 U/L (ref 0–44)
AST: 14 U/L — ABNORMAL LOW (ref 15–41)
Albumin: 4.3 g/dL (ref 3.5–5.0)
Alkaline Phosphatase: 101 U/L (ref 38–126)
Anion gap: 9 (ref 5–15)
BUN: 26 mg/dL — ABNORMAL HIGH (ref 8–23)
CO2: 37 mmol/L — ABNORMAL HIGH (ref 22–32)
Calcium: 9.6 mg/dL (ref 8.9–10.3)
Chloride: 96 mmol/L — ABNORMAL LOW (ref 98–111)
Creatinine: 1.04 mg/dL (ref 0.61–1.24)
GFR, Estimated: >60 mL/min (ref >60)
Glucose, Bld: 103 mg/dL — ABNORMAL HIGH (ref 70–99)
Potassium: 3 mmol/L — ABNORMAL LOW (ref 3.5–5.1)
Sodium: 142 mmol/L (ref 135–145)
Total Bilirubin: 0.5 mg/dL (ref 0.3–1.2)
Total Protein: 6.8 g/dL (ref 6.5–8.1)

## 2022-12-15 LAB — CBC WITH DIFFERENTIAL (CANCER CENTER ONLY)
Abs Immature Granulocytes: 0.07 10*3/uL (ref 0.00–0.07)
Basophils Absolute: 0.1 10*3/uL (ref 0.0–0.1)
Basophils Relative: 1 %
Eosinophils Absolute: 0.1 10*3/uL (ref 0.0–0.5)
Eosinophils Relative: 1 %
HCT: 41.7 % (ref 39.0–52.0)
Hemoglobin: 13.5 g/dL (ref 13.0–17.0)
Immature Granulocytes: 1 %
Lymphocytes Relative: 5 %
Lymphs Abs: 0.4 10*3/uL — ABNORMAL LOW (ref 0.7–4.0)
MCH: 30.4 pg (ref 26.0–34.0)
MCHC: 32.4 g/dL (ref 30.0–36.0)
MCV: 93.9 fL (ref 80.0–100.0)
Monocytes Absolute: 0.4 10*3/uL (ref 0.1–1.0)
Monocytes Relative: 6 %
Neutro Abs: 6.9 10*3/uL (ref 1.7–7.7)
Neutrophils Relative %: 86 %
Platelet Count: 279 10*3/uL (ref 150–400)
RBC: 4.44 MIL/uL (ref 4.22–5.81)
RDW: 17.3 % — ABNORMAL HIGH (ref 11.5–15.5)
WBC Count: 7.9 10*3/uL (ref 4.0–10.5)
nRBC: 0 % (ref 0.0–0.2)

## 2022-12-15 MED ORDER — BORTEZOMIB CHEMO SQ INJECTION 3.5 MG (2.5MG/ML)
1.3000 mg/m2 | Freq: Once | INTRAMUSCULAR | Status: AC
  Administered 2022-12-15: 3 mg via SUBCUTANEOUS
  Filled 2022-12-15: qty 1.2

## 2022-12-15 NOTE — Progress Notes (Signed)
[facility] [phone]   [phone]  PROGRESS NOTE  Patient Care Team: [name], MD as PCP - General (Internal Medicine) Pricilla Riffle, MD as PCP - Cardiology (Cardiology) Quintella Reichert, MD as PCP - Sleep Medicine (Cardiology) Lanier Prude, MD as PCP - Electrophysiology (Cardiology) [name], MD as Consulting Physician (General Surgery) Vida Rigger, MD as Consulting Physician (Gastroenterology) Quintella Reichert, MD as Consulting Physician (Sleep Medicine) Glendale Chard, DO as Consulting Physician (Neurology)  Hematological/Oncological History # IgA Kappa Multiple Myeloma 05/05/2020: Bone marrow biopsy showed increased number of plasma cells representing 14% of all cells in the aspirate associated with numerous variably sized clusters in the clot and biopsy sections 04/10/2021: M protein 0.3, IgA kappa specificity. Kappa 580.5, Labda 7.0, ratio 82.93.   12/07/2021: Labs show of 1971.3, lambda 5.5, ratio 358.42 12/10/2021: Transition care to Dr. Leonides Schanz. 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  01/26/2022: Cycle 2 Day 1 of Dara/Rev/Dex 02/22/2022: Cycle 3 Day 1 of Dara/Rev/Dex 03/23/2022: Cycle 4 Day 1 of Dara/Rev/Dex 04/20/2022: Cycle 5 Day 1 of Dara/Rev/Dex 05/18/2022: Cycle 6 Day 1 of Dara/Rev/Dex 05/27/2022: MRI cervical/thoracic/lumbar: pathologic fracture at T2 with significant osseous retropulsion and resulting cord compression. Additional smaller lesions within T1 vertebral body, right C7 and T3 articulating facets.  Small Multiple Myeloma lesions scattered at all lumbar levels and in the visible left sacral ala. Largest lumbar tumor is 2.3 cm in the left L1 posterior elements and pedicle. No lumbar extraosseous or epidural tumor extension, or pathologic fracture.Underlying chronic lumbar spine  degeneration, with a new central disc herniation at L3-L4 since 2021 now resulting in mild to moderate degenerative spinal stenosis there. 05/28/2022-06/09/2022: Admitted for T2 pathologic fracture and cord compression. He received urgent radiation therapy for a total of 3.0 Gy. He underwent  thoracic 1 through 4 posterior lateral arthrodesis/fusion and 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  06/23/2022: Cycle 1 Day 1 of Velcade/Pomalyst/Dex 08/04/2022: Cycle 2 Day 1 of Velcade/Pomalyst/Dex. Pomalyst to be decreased to 2mg  PO daily due to cytopenias.  09/01/2022: Cycle 3 Day 1 of Velcade/Pomalyst/Dex. (Missed 2nd dose of cycle due to hospitalization)  10/06/2022: Cycle 4 Day 1 of Velcade/Pomalyst/Dex.  10/27/2022: Cycle 5 Day 1 of Velcade/Pomalyst/Dex.  12/02/2022: Cycle 6 Day 1 of Velcade/Pomalyst/Dex.   Interval History:  Travis [name] 72 y.o. male with medical history significant for IgA Kappa Multiple Myeloma who presents for a follow up visit. He was last seen on 8/012024. In the interim, he continued on Velcade/Pomalyst/Dex therapy.  On exam today Mr. Ferretiz reports his swelling in his legs and abdomen have greatly improved over the last 24 hours. He is compliant with taking his diuretics prescribed by cardiology. He reports energy levels are overall stable. He is still trying to exercise and do physical therapy. He denies any appetite changes. He denies nausea, vomiting or bowel habit changes. Overall, he is willing and able to proceed with treatment at this time. He denies fevers, chills, sweats, shortness of breath, chest pain or cough. He has no other complaints. Rest of the 10 point ROS is below.     MEDICAL HISTORY:  Past Medical History:  Diagnosis Date   A-fib [facility])    Arthritis    "comes and goes; mostly in hands, occasionally elbow" (04/05/2017)   Complication of anesthesia    "just wanted to sleep  alot  for couple days S/P VARICOCELE OR" (04/05/2017)   DVT (deep  venous thrombosis) (HCC)    LLE   Dysrhythmia    PVCs    GERD (gastroesophageal reflux disease)    History of hiatal hernia    History of radiation therapy    Thoracic Spine- 07/13/22-07/23/22-Dr. Antony Blackbird   Hypertension    Impaired glucose tolerance    Multiple myeloma (HCC)    Obesity (BMI 30-39.9) 07/26/2016   OSA on CPAP 07/26/2016   Mild with AHI 12.5/hr now on CPAP at 14cm H2O   PAF (paroxysmal atrial fibrillation) (HCC)    s/p PVI Ablation in 2018 // Flecainide Rx // Echo 8/21: EF 60-65, no RWMA, mild LVH, normal RV SF, moderate RVE, mild BAE, trivial MR, mild dilation of aortic root (40 mm)   Pneumonia    "may have had walking pneumonia a few years ago" (04/05/2017)   Pre-diabetes    Thrombophlebitis     SURGICAL HISTORY: Past Surgical History:  Procedure Laterality Date   ATRIAL FIBRILLATION ABLATION  04/05/2017   ATRIAL FIBRILLATION ABLATION N/A 04/05/2017   Procedure: ATRIAL FIBRILLATION ABLATION;  Surgeon: [name], MD;  Location: [facility];  Service: Cardiovascular;  Laterality: N/A;   BONE BIOPSY Right 12/17/2021   Procedure: BONE BIOPSY;  Surgeon: [name], MD;  Location: [facility];  Service: Orthopedics;  Laterality: Right;   COMPLETE RIGHT HIP REPLACMENT  01/19/2019   EVALUATION UNDER ANESTHESIA WITH HEMORRHOIDECTOMY N/A 02/24/2017   Procedure: EXAM UNDER ANESTHESIA WITH  HEMORRHOIDECTOMY;  Surgeon: [name], MD;  Location: [facility];  Service: General;  Laterality: N/A;   EXCISIONAL TOTAL HIP ARTHROPLASTY WITH ANTIBIOTIC SPACERS Right 09/25/2020   Procedure: EXCISIONAL TOTAL HIP ARTHROPLASTY WITH ANTIBIOTIC SPACERS;  Surgeon: [name], MD;  Location: [facility];  Service: Orthopedics;  Laterality: Right;   FLEXIBLE SIGMOIDOSCOPY N/A 04/15/2015   Procedure:  UNSEDATED FLEXIBLE SIGMOIDOSCOPY;  Surgeon: [name], MD;  Location: [facility];  Service: Endoscopy;  Laterality: N/A;   HUMERUS IM NAIL Right 12/17/2021    Procedure: INTRAMEDULLARY (IM) NAIL HUMERAL;  Surgeon: [name], MD;  Location: [facility];  Service: Orthopedics;  Laterality: Right;   KNEE ARTHROSCOPY Left ~ 2016   POSTERIOR CERVICAL FUSION/FORAMINOTOMY N/A 06/04/2022   Procedure: Thoracic one - Thoracic Four Posterior lateral arthrodesis/ fusion and Thoracic two Corpectomy;  Surgeon: [name], MD;  Location: [facility];  Service: Neurosurgery;  Laterality: N/A;   REIMPLANTATION OF TOTAL HIP Right 12/25/2020   Procedure: REIMPLANTATION/REVISION OF RIGHT TOTAL HIP VERSUS REPEAT IRRIGATION AND DEBRIDEMENT;  Surgeon: [name], MD;  Location: [facility];  Service: Orthopedics;  Laterality: Right;   TESTICLE SURGERY  1988   VARICOCELE   TONSILLECTOMY     VARICOSE VEIN SURGERY Bilateral     SOCIAL HISTORY: Social History   Socioeconomic History   Marital status: Married    Spouse name: Not on file   Number of children: 1   Years of education: law school   Highest education level: Not on file  Occupational History   Occupation: lawyer  Tobacco Use   Smoking status: Never   Smokeless tobacco: Never  Vaping Use   Vaping status: Never Used  Substance and Sexual Activity   Alcohol use: Yes    Alcohol/week: 2.0 standard drinks of alcohol    Types: 2 Glasses of wine per week    Comment: 2 glasses of wine a week, occ bourbon, beer  Drug use: No   Sexual activity: Not Currently  Other Topics Concern   Not on file  Social History Narrative   Lives in [place] with spouse in a 2 story home.  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 1 daughter.     Education: Social worker school.    Social Determinants of Health   Financial Resource Strain: Not on file  Food Insecurity: No Food Insecurity (09/27/2022)   Hunger Vital Sign    Worried About Running Out of Food in the Last Year: Never true    Ran Out of Food in the Last Year: Never true  Transportation Needs: No Transportation Needs (09/27/2022)   PRAPARE  - Administrator, Civil Service (Medical): No    Lack of Transportation (Non-Medical): No  Physical Activity: Not on file  Stress: Not on file  Social Connections: Unknown (09/15/2021)   [facility]   Social Network    Social Network: Not on file  Intimate Partner Violence: Not At Risk (09/27/2022)   Humiliation, Afraid, Rape, and Kick questionnaire    Fear of Current or Ex-Partner: No    Emotionally Abused: No    Physically Abused: No    Sexually Abused: No    FAMILY HISTORY: Family History  Problem Relation Age of Onset   Dementia Mother    Stroke Father    Atrial fibrillation Father    Hypertension Father    Hypertension Paternal Grandfather    Dementia Maternal Grandmother     ALLERGIES:  is allergic to linezolid and vancomycin.  MEDICATIONS:  Current Outpatient Medications  Medication Sig Dispense Refill   acyclovir (ZOVIRAX) 400 MG tablet Take 1 tablet (400 mg total) by mouth 2 (two) times daily. 180 tablet 3   albuterol (VENTOLIN HFA) 108 (90 Base) MCG/ACT inhaler Inhale 2 puffs into the lungs every 6 (six) hours as needed for wheezing or shortness of breath. 8 g 0   amiodarone (PACERONE) 200 MG tablet Take 1 tablet (200 mg total) by mouth daily. Start after finishing 40 mg twice daily dose of  days (Patient taking differently: Take 200 mg by mouth daily.) 180 tablet 1   apixaban (ELIQUIS) 5 MG TABS tablet Take 1 tablet (5 mg total) by mouth 2 (two) times daily. 60 tablet 11   calcium carbonate (TUMS - DOSED IN MG ELEMENTAL CALCIUM) 500 MG chewable tablet Chew 1 tablet by mouth daily.     cholecalciferol (VITAMIN D3) 25 MCG (1000 UNIT) tablet Take 1,000 Units by mouth daily.     Cyanocobalamin (VITAMIN B-12 PO) Take 1,000 mcg by mouth daily.     dexamethasone (DECADRON) 4 MG tablet Take 10 tablets (40 mg total) by mouth every 7 (seven) days. Take 40 mg by mouth once a day in the morning of chemo on Wednesdays- or unless otherwise  instructed 40 tablet 2   fluticasone (FLONASE) 50 MCG/ACT nasal spray Place 1 spray into both nostrils daily as needed for allergies or rhinitis.     gabapentin (NEURONTIN) 300 MG capsule Take 2 capsules (600 mg total) by mouth 2 (two) times daily. 360 capsule 1   hydrALAZINE (APRESOLINE) 50 MG tablet Take 1.5 tablets (75 mg total) by mouth 3 (three) times daily. 135 tablet 3   irbesartan (AVAPRO) 150 MG tablet Take 1 tablet (150 mg total) by mouth daily. 30 tablet 5   metolazone (ZAROXOLYN) 2.5 MG tablet Take 1 tablet (2.5 mg total) by mouth  every other day. 45 tablet 3   metoprolol succinate (TOPROL-XL) 50 MG 24 hr tablet Take 1/2 to 1 as needed up to twice a day for AFIB and heart rate above 100. 90 tablet 3   Omega-3 Fatty Acids (FISH OIL PO) Take 1 Capful by mouth daily.     polyethylene glycol powder (GLYCOLAX/MIRALAX) 17 GM/SCOOP powder Take 1 Container by mouth daily.     pomalidomide (POMALYST) 2 MG capsule Take 1 capsule (2 mg total) by mouth daily. Take one tablet (2 mg by mouth) daily for 21 days, then take 7 days off [employer] auth # [id number] Date obtained 11/22/22 21 capsule 0   potassium chloride SA (KLOR-CON M) 20 MEQ tablet Take 2 tablets (40 mEq total) by mouth daily. Take an extra tablet on the day you take the Metolazone (60 meq total) (Patient taking differently: Take 40 mEq by mouth 3 (three) times daily. Take an extra tablet on the day you take the Metolazone (60 meq total)) 225 tablet 3   sodium chloride (OCEAN) 0.65 % SOLN nasal spray Place 1 spray into both nostrils as needed for congestion.     TART CHERRY PO Take 1 tablet by mouth at bedtime.     torsemide (DEMADEX) 20 MG tablet Take 2 tablets (40 mg total) by mouth daily. 90 tablet 3   No current facility-administered medications for this visit.    REVIEW OF SYSTEMS:   Constitutional: ( - ) fevers, ( - )  chills , ( - ) night sweats Eyes: ( - ) blurriness of vision, ( - ) double vision, ( - ) watery eyes Ears, nose,  mouth, throat, and face: ( - ) mucositis, ( - ) sore throat Respiratory: ( - ) cough, ( - ) dyspnea, ( - ) wheezes Cardiovascular: ( - ) palpitation, ( - ) chest discomfort, ( - ) lower extremity swelling Gastrointestinal:  ( - ) nausea, ( - ) heartburn, ( - ) change in bowel habits Skin: ( - ) abnormal skin rashes Lymphatics: ( - ) new lymphadenopathy, ( - ) easy bruising Neurological: ( - ) numbness, ( - ) tingling, ( - ) new weaknesses Behavioral/Psych: ( - ) mood change, ( - ) new changes  All other systems were reviewed with the patient and are negative.  PHYSICAL EXAMINATION: ECOG PERFORMANCE STATUS: 2 - Symptomatic, <50% confined to bed  Vitals:   12/15/22 1400  BP: 135/67  Pulse: 84  Temp: 98.4 F (36.9 C)  SpO2: 97%    Filed Weights   12/15/22 1400  Weight: 246 lb 9.6 oz (111.9 kg)     GENERAL: Well-appearing elderly Caucasian male, alert, no distress and comfortable SKIN: no overt abnormalities of the skin.  EYES: conjunctiva are pink and non-injected, sclera clear LUNGS: clear to auscultation and percussion with normal breathing effort HEART: tachycardic &  no murmurs. +bilateral lower extremity edema Musculoskeletal: no cyanosis of digits and no clubbing  PSYCH: alert & oriented x 3, fluent speech NEURO: no focal motor/sensory deficits  LABORATORY DATA:  I have reviewed the data as listed    Latest Ref Rng & Units 12/15/2022    1:40 PM 12/08/2022   12:20 PM 12/02/2022   11:17 AM  CBC  WBC 4.0 - 10.5 K/uL 7.9  6.9  5.4   Hemoglobin 13.0 - 17.0 g/dL 54.0  98.1  19.1   Hematocrit 39.0 - 52.0 % 41.7  34.1  33.1   Platelets 150 - 400 K/uL 279  191  153        Latest Ref Rng & Units 12/08/2022   12:20 PM 12/02/2022   11:17 AM 11/24/2022    1:31 PM  CMP  Glucose 70 - 99 mg/dL 782  97  956   BUN 8 - 23 mg/dL 22  25  27    Creatinine 0.61 - 1.24 mg/dL 2.13  0.86  5.78   Sodium 135 - 145 mmol/L 144  144  144   Potassium 3.5 - 5.1 mmol/L 3.7  3.7  4.1   Chloride 98  - 111 mmol/L 110  109  110   CO2 22 - 32 mmol/L 27  26  27    Calcium 8.9 - 10.3 mg/dL 9.0  8.4  9.1   Total Protein 6.5 - 8.1 g/dL 6.1  5.8  6.1   Total Bilirubin 0.3 - 1.2 mg/dL 0.5  0.4  0.5   Alkaline Phos 38 - 126 U/L 77  76  92   AST 15 - 41 U/L 16  12  20    ALT 0 - 44 U/L 24  19  30      Lab Results  Component Value Date   MPROTEIN Not Observed 12/02/2022   MPROTEIN Not Observed 11/03/2022   MPROTEIN Not Observed 10/27/2022   Lab Results  Component Value Date   KPAFRELGTCHN 94.2 (H) 12/02/2022   KPAFRELGTCHN 44.5 (H) 11/03/2022   KPAFRELGTCHN 41.8 (H) 10/27/2022   LAMBDASER 20.3 12/02/2022   LAMBDASER 13.5 11/03/2022   LAMBDASER 15.2 10/27/2022   KAPLAMBRATIO 4.64 (H) 12/02/2022   KAPLAMBRATIO 3.30 (H) 11/03/2022   KAPLAMBRATIO 2.75 (H) 10/27/2022     RADIOGRAPHIC STUDIES: LONG TERM MONITOR (3-14 DAYS)  Result Date: 12/01/2022 Impression:   Rhythms:   Sinus rhythm and atrial fibrillation (47% burden  Longest episode for 1 day, 10 hours 23 min on 11/15/22)  Heart rates 42 to 158 bpms   Average HR 76 bpm.  Rare PVCs Couple triplets    Triggered events corresponded to Sinus rhythm and atrial fibrillation  None were at extreme heart rates ___________________________________________________________________________ ________  The Maximum Heart Rate recorded was 158 bpm, 07/ 19 19: 41: 59, the Minimum Heart Rate recorded was 42 bpm, 07/ 22 03: 22: 34, and the Average Heart Rate was 76 bpm. * There were 6, 934 VE beats with a burden of < 1 % . There was 1 occurrence of Ventricular Tachycardia with the Fastest episode 158 bpm, 07/ 19 19: 41: 59, and the Longest episode 3 beats, 07/ 19 19: 41: 59. * There were 7, 614 SVE beats with a burden of < 1 % . There were 41 occurrences of Supraventricular Tachycardia with the Fastest episode 129 bpm, 07/ 16 20: 46: 35, and the Longest episode 7 beats, 07/ 18 22: 21: 21. * The study included an Atrial Fibrillation/ Flutter Burden of 47 % with 38 % in  RVR and < 1 % in SVR. The longest episode was 1d 10h 69m 50. 4s, 07/ 15 09: 38: 44, and the Fastest episode was 152 bpm, 07/ 12 01: 18: 52. * Other rhythms in this study include: Ventricular Run.    ASSESSMENT & PLAN MOBEEN KUTZNER is a 72 y.o. male with medical history significant for IgA Kappa Multiple Myeloma who presents for a follow up visit.   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  He meets diagnostic criteria by  having a serum free light chain ratio greater than 100 and lytic lesions causing pathological fracture of the humerus.  Given this I would recommend we pursued Darzalex, Revlimid, and dexamethasone.  Given his advanced age I would not recommend transplantation, that this is something we can consider when it comes time to transition to maintenance therapy versus transplant.  The patient voices understanding of the plan moving forward.  # IgA Kappa Multiple Myeloma --Metastatic survey completed 12/15/2021 --Patient underwent fixation of his humerus on 12/17/2021, pathology confirmed plasmacytoma/plasma cell neoplasm.  --Received 6 cycles of Darzalex, Revlimid, and dexamethasone started on 12/28/2021-05/18/2022.  --Due to worsening myeloma osseous lesions including pathologic fracture at T2 with cord compression, Dr. Leonides Schanz recommends changing therapy to Velcade/Pomalyst/Dex.  Plan: --Due for Cycle 6, Day 15 of Velcade/Pom/Dex today --labs from today showed white blood cell 7.9, hemoglobin 13.5, MCV 93.9, and platelets of 279 --Most recent myeloma labs from 12/02/2022 with SPEP and SFLC showed M protein not detectable. Kappa increased to 94.2 (from 44.5), Lambda 20.3, Ratio 4.64. --Proceed with treatment today without any dose modifications.  --Will repeat myeloma labs next week to closely trend kappa light chain levels.  --Pomalyst was dose reduced to 2 mg p.o. due to cytopenias.  Recommend to continue --RTC in 2 weeks with interval weekly treatments.   #Double vision: --Discussed with Dr. Leonides Schanz who recommend to monitor and continue with Velcade therapy.  --Proceed with corrective lenses as prescribed by opthalmologist.   #Neck pain and mid back pain: --Suspect pain is exacerbated from recent hospitalization and being in bed/sitting for long periods.  --MRI C/T/L spine obtinaed that showed unchanged lytic lesions in the right C7 inferior articular process,T6, T8, and T10  vertebral bodies, as well  as the T11 spinous process. Unchanged  multilevel cervical spondylsis, Interval enlargement of disc herniation at L3-L4 with progressive spinal canal stenosis. No new or progressive lytic lesions.  --Encouraged to continue with physical therapy and follow up with ortho.   #Palliative Radiation Therapy: # Dysphagia --Received palliative radiation from 01/06/2022-01/19/2022 to right hymerus, right forearm and right chest/rib, 20 Gy in 10 Fx.  --recieved palliative radiation to surgical bed in the upper thoracic spine area on 07/13/2022.  27 Gy in 9 Fx.   #Lower extremity edema: --Currently on lasix and torsemide with minimal improvement. Advised to follow up with cardiologist to discuss dose or medication changes.   # Rash Concerning for Shingles-resolved -- Prescribed valacyclovir 3 times daily 1000 mg x 10 days-completed the course -- We will continue to monitor rash closely.  #DVT: --Found to have acute DVT involving left peroneal veins. Likely secondary to recent surgery and hospitalization --Currently on eliquis 5 mg BID.    #Hypokalemia: --Currently on potassium chloride 40 mEq twice a day as prescribed but takes three times a day (60 mEq) on the days he takes Metolazine.  --Potassium level is 3.0 today. Patient admit he didn't take extra dose with his Metolazine as instructed yesterday. Advised to continue with 40 mEq twice daily and increase to 60 mEq when he takes  his next dose of metolazine.   #Rash on scalp and right forearm: --Recommend topical hydrocortisone cream for spot treatment  #Supportive Care -- chemotherapy education complete -- port placement not required -- zofran 8mg  q8H PRN and compazine 10mg  PO q6H for nausea -- acyclovir 400mg  PO BID for VCZ prophylaxis -- Started Zometa q 12 weeks on 08/18/2022. Next dose July 2024.   No orders of the defined types were placed  in this encounter.  All questions were answered. The patient knows to call the clinic with any problems, questions or concerns.  A total of more than 30 minutes were spent on this encounter with face-to-face time and non-face-to-face time, including preparing to see the patient, ordering tests and/or medications, counseling the patient and coordination of care as outlined above.   Georga Kaufmann PA-C Dept of Hematology and Oncology [facility] Phone: [phone]     12/15/2022 2:13 PM

## 2022-12-15 NOTE — Patient Instructions (Signed)
[facility]  Discharge Instructions: Thank you for choosing [facility] to provide your oncology and hematology care.   If you have a lab appointment with the Cancer Center, please go directly to the Cancer Center and check in at the registration area.   Wear comfortable clothing and clothing appropriate for easy access to any Portacath or PICC line.   We strive to give you quality time with your provider. You may need to reschedule your appointment if you arrive late (15 or more minutes).  Arriving late affects you and other patients whose appointments are after yours.  Also, if you miss three or more appointments without notifying the office, you may be dismissed from the clinic at the provider's discretion.      For prescription refill requests, have your pharmacy contact our office and allow 72 hours for refills to be completed.    Today you received the following chemotherapy and/or immunotherapy agents: Bortezomib        To help prevent nausea and vomiting after your treatment, we encourage you to take your nausea medication as directed.  BELOW ARE SYMPTOMS THAT SHOULD BE REPORTED IMMEDIATELY: *FEVER GREATER THAN 100.4 F (38 C) OR HIGHER *CHILLS OR SWEATING *NAUSEA AND VOMITING THAT IS NOT CONTROLLED WITH YOUR NAUSEA MEDICATION *UNUSUAL SHORTNESS OF BREATH *UNUSUAL BRUISING OR BLEEDING *URINARY PROBLEMS (pain or burning when urinating, or frequent urination) *BOWEL PROBLEMS (unusual diarrhea, constipation, pain near the anus) TENDERNESS IN MOUTH AND THROAT WITH OR WITHOUT PRESENCE OF ULCERS (sore throat, sores in mouth, or a toothache) UNUSUAL RASH, SWELLING OR PAIN  UNUSUAL VAGINAL DISCHARGE OR ITCHING   Items with * indicate a potential emergency and should be followed up as soon as possible or go to the Emergency Department if any problems should occur.  Please show the CHEMOTHERAPY ALERT CARD or IMMUNOTHERAPY ALERT CARD at  check-in to the Emergency Department and triage nurse.  Should you have questions after your visit or need to cancel or reschedule your appointment, please contact [facility]  Dept: [phone]  and follow the prompts.  Office hours are 8:00 a.m. to 4:30 p.m. Monday - Friday. Please note that voicemails left after 4:00 p.m. may not be returned until the following business day.  We are closed weekends and major holidays. You have access to a nurse at all times for urgent questions. Please call the main number to the clinic Dept: [phone] and follow the prompts.   For any non-urgent questions, you may also contact your provider using MyChart. We now offer e-Visits for anyone 3 and older to request care online for non-urgent symptoms. For details visit [email or web address].   Also download the MyChart app! Go to the app store, search "MyChart", open the app, select [facility], and log in with your MyChart username and password.

## 2022-12-16 ENCOUNTER — Encounter: Payer: Self-pay | Admitting: Hematology and Oncology

## 2022-12-20 ENCOUNTER — Ambulatory Visit: Payer: Medicare Other

## 2022-12-20 DIAGNOSIS — C9000 Multiple myeloma not having achieved remission: Secondary | ICD-10-CM

## 2022-12-20 DIAGNOSIS — M25511 Pain in right shoulder: Secondary | ICD-10-CM | POA: Diagnosis not present

## 2022-12-20 DIAGNOSIS — G8929 Other chronic pain: Secondary | ICD-10-CM | POA: Diagnosis not present

## 2022-12-20 DIAGNOSIS — S22000A Wedge compression fracture of unspecified thoracic vertebra, initial encounter for closed fracture: Secondary | ICD-10-CM

## 2022-12-20 DIAGNOSIS — R293 Abnormal posture: Secondary | ICD-10-CM | POA: Diagnosis not present

## 2022-12-20 DIAGNOSIS — R278 Other lack of coordination: Secondary | ICD-10-CM

## 2022-12-20 DIAGNOSIS — M542 Cervicalgia: Secondary | ICD-10-CM | POA: Diagnosis not present

## 2022-12-20 DIAGNOSIS — M6281 Muscle weakness (generalized): Secondary | ICD-10-CM

## 2022-12-20 NOTE — Therapy (Signed)
OUTPATIENT PHYSICAL THERAPY ONCOLOGY TREATMENT  Patient Name: Travis [name] MRN: [id number] DOB:1950/09/23, 72 y.o., male Today's Date: 12/20/2022   END OF SESSION:  PT End of Session - 12/20/22 1402     Visit Number 21    Number of Visits 29    Date for PT Re-Evaluation 01/17/23    PT Start Time 1403    PT Stop Time 1455    PT Time Calculation (min) 52 min    Equipment Utilized During Treatment Gait belt    Activity Tolerance Patient tolerated treatment well    Behavior During Therapy WFL for tasks assessed/performed                  Past Medical History:  Diagnosis Date   A-fib (HCC)    Arthritis    "comes and goes; mostly in hands, occasionally elbow" (04/05/2017)   Complication of anesthesia    "just wanted to sleep alot  for couple days S/P VARICOCELE OR" (04/05/2017)   DVT (deep venous thrombosis) (HCC)    LLE   Dysrhythmia    PVCs    GERD (gastroesophageal reflux disease)    History of hiatal hernia    History of radiation therapy    Thoracic Spine- 07/13/22-07/23/22-Dr. Antony Blackbird   Hypertension    Impaired glucose tolerance    Multiple myeloma (HCC)    Obesity (BMI 30-39.9) 07/26/2016   OSA on CPAP 07/26/2016   Mild with AHI 12.5/hr now on CPAP at 14cm H2O   PAF (paroxysmal atrial fibrillation) (HCC)    s/p PVI Ablation in 2018 // Flecainide Rx // Echo 8/21: EF 60-65, no RWMA, mild LVH, normal RV SF, moderate RVE, mild BAE, trivial MR, mild dilation of aortic root (40 mm)   Pneumonia    "may have had walking pneumonia a few years ago" (04/05/2017)   Pre-diabetes    Thrombophlebitis    Past Surgical History:  Procedure Laterality Date   ATRIAL FIBRILLATION ABLATION  04/05/2017   ATRIAL FIBRILLATION ABLATION N/A 04/05/2017   Procedure: ATRIAL FIBRILLATION ABLATION;  Surgeon: [name], MD;  Location: [facility];  Service: Cardiovascular;  Laterality: N/A;   BONE BIOPSY Right 12/17/2021   Procedure: BONE BIOPSY;  Surgeon: [name], MD;  Location: [facility];  Service: Orthopedics;  Laterality: Right;   COMPLETE RIGHT HIP REPLACMENT  01/19/2019   EVALUATION UNDER ANESTHESIA WITH HEMORRHOIDECTOMY N/A 02/24/2017   Procedure: EXAM UNDER ANESTHESIA WITH  HEMORRHOIDECTOMY;  Surgeon: [name], MD;  Location: [facility];  Service: General;  Laterality: N/A;   EXCISIONAL TOTAL HIP ARTHROPLASTY WITH ANTIBIOTIC SPACERS Right 09/25/2020   Procedure: EXCISIONAL TOTAL HIP ARTHROPLASTY WITH ANTIBIOTIC SPACERS;  Surgeon: [name], MD;  Location: [facility];  Service: Orthopedics;  Laterality: Right;   FLEXIBLE SIGMOIDOSCOPY N/A 04/15/2015   Procedure:  UNSEDATED FLEXIBLE SIGMOIDOSCOPY;  Surgeon: [name], MD;  Location: [facility];  Service: Endoscopy;  Laterality: N/A;   HUMERUS IM NAIL Right 12/17/2021   Procedure: INTRAMEDULLARY (IM) NAIL HUMERAL;  Surgeon: [name], MD;  Location: [facility];  Service: Orthopedics;  Laterality: Right;   KNEE ARTHROSCOPY Left ~ 2016   POSTERIOR CERVICAL FUSION/FORAMINOTOMY N/A 06/04/2022   Procedure: Thoracic one - Thoracic Four Posterior lateral arthrodesis/ fusion and Thoracic two Corpectomy;  Surgeon: [name], MD;  Location: [facility];  Service: Neurosurgery;  Laterality: N/A;   REIMPLANTATION OF TOTAL HIP Right 12/25/2020   Procedure: REIMPLANTATION/REVISION OF RIGHT TOTAL HIP VERSUS  REPEAT IRRIGATION AND DEBRIDEMENT;  Surgeon: [name], MD;  Location: [facility];  Service: Orthopedics;  Laterality: Right;   TESTICLE SURGERY  1988   VARICOCELE   TONSILLECTOMY     VARICOSE VEIN SURGERY Bilateral    Patient Active Problem List   Diagnosis Date Noted   Pancreatic cyst 09/30/2022   Infection due to novel influenza A virus 09/29/2022   Persistent atrial fibrillation (HCC) 07/15/2022   Acute on chronic diastolic heart failure (HCC) 07/03/2022   Pulmonary infiltrates 07/01/2022   Acute respiratory failure with hypoxia (HCC) 07/01/2022   Hyponatremia  06/29/2022   History of thoracic surgery 06/29/2022   Acute DVT of left tibial vein (HCC) 06/21/2022   Hypokalemia 06/21/2022   Vitamin D deficiency 05/30/2022   Other constipation 05/29/2022   Pathologic compression fracture of spine, initial encounter (HCC) 05/29/2022   Cord compression (HCC) 05/28/2022   Degenerative cervical disc 05/14/2022   Hypercoagulable state due to persistent atrial fibrillation (HCC) 01/11/2022   Multiple myeloma (HCC) 12/10/2021   Medication monitoring encounter 02/27/2021   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   Status post peripherally inserted central catheter (PICC) central line placement 11/06/2020   Drug rash with eosinophilia and systemic symptoms syndrome 11/06/2020   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   PAF (paroxysmal atrial fibrillation) (HCC)    GERD (gastroesophageal reflux disease) 03/29/2018   Hiatal hernia 03/29/2018   History of DVT (deep vein thrombosis) 03/29/2018   CAP (community acquired pneumonia) 03/29/2018   Osteoarthritis 03/29/2018   Neuropathy 08/29/2017   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   Paroxysmal atrial fibrillation with rapid ventricular response (HCC) 04/05/2017   Prolapsed internal hemorrhoids, grade 4, s/p ligation/pexy/hemorrhoidectomy x 2 02/24/2017 02/24/2017   OSA (obstructive sleep apnea) 07/26/2016   Obesity (BMI 30-39.9) 07/26/2016   Atrial fibrillation with RVR (HCC)    Essential hypertension    Chest pain at rest     PCP: Eleanora Neighbor, MD  REFERRING PROVIDER: Antony Blackbird, MD  REFERRING DIAG: Multiple Myeloma  THERAPY DIAG:  Compression fracture of thoracic vertebra, unspecified thoracic vertebral level, initial encounter (HCC)  Multiple myeloma not having achieved remission (HCC)  Muscle weakness (generalized)  Abnormal posture  Neck pain  Other lack of coordination  Chronic right shoulder pain  ONSET DATE: 2023  Rationale for Evaluation and  Treatment: Rehabilitation  SUBJECTIVE:                                                                                                                                                                                           SUBJECTIVE STATEMENT: I have had pretty good control  of my O2, HR, and BP over the last couple of weeks. Breathing has been much better. Fluid retention has been pretty good. I had an uncontrolled fall last Thursday. [place] and I fell but managed to land in the grass. I strained my back muscles and left wrist.  I did not feel I needed to see a Dr. I got my walker back out for a bit so I wouldn't fall again. I don't think I need Xrays. No changes in neck pain since the fall. I think I just sprained some muscles and it is improving.  PERTINENT HISTORY:  KIAH HOMEYER 72 y.o. male with medical history significant for IgA Kappa Multiple Myeloma. He was diagnosed with smouldering myeloma in 2018 . Last July he developed right arm pain due to a pathologic fx and he underwent radiation and fixation. In early Feb 2024 he had radiation for pathologic T2 compression fx followed by a T1-T4 fusion. PAIN:  Are you having pain?  PAIN:  Are you having pain? Yes NPRS scale: 3/10 present, 5/10 at worst., 3-4/10 neck/upper back Pain location: Left greater than right LB Pain orientation: Left  PAIN TYPE: aching Pain description: constant  Aggravating factors: standing,rising from chair Relieving factors: recliner, lying down,  PRECAUTIONS: Multiple Myeloma with mets to R ribs, R humerus with ORIF for pathological fracture 12/17/21. Lesions have also been found in R rib and also in his skull. Pathologic fx T2 with fusion T1-4 on 06/04/22, Bilateral LE edema (on Lasix), Afib   WEIGHT BEARING RESTRICTIONS: No  FALLS:  Has patient fallen in last 6 months? Yes. Number of falls 1  LIVING ENVIRONMENT: Lives with: lives with their family and lives with their  spouse Lives in: House/apartment Stairs: Yes; Internal: 14 steps; on left going up and External: 3 steps; on right going up Has following equipment at home: Single point cane and Walker - 4 wheeled  OCCUPATION: attorney/ mostly retired?  LEISURE: golf (can't do now.), use to play racquetball  HAND DOMINANCE: right   PRIOR LEVEL OF FUNCTION: Independent  POSTURE:significant slump sitting posture with forward head  PATIENT GOALS: decrease pain,get stronger,improve balance   OBJECTIVE:  COGNITION: Overall cognitive status: Within functional limits for tasks assessed   PALPATION: Very tender bilateral UT/levator, scapular regions  OBSERVATIONS / OTHER ASSESSMENTS: very flexed head and neck with chin nearly at chest  SENSATION: Light touch: Appears intact    POSTURE: significant forward head, round shoulders, increased kyphosis   Gait holding single point cane in left hand but not using. Right shoulder low, flexed neck;difficulty raising head to see where he is going  UPPER EXTREMITY AROM/PROM:  A/PROM RIGHT   eval  RIGHT 11/08/2022 RIGHT 12/20/2022  Shoulder extension     Shoulder flexion 91 with compensation 101 with compensation  101 with compensation  Shoulder abduction 67 with compensation 75 with compensation 75 compensation  Shoulder internal rotation     Shoulder external rotation C6      (Blank rows = not tested)  A/PROM LEFT   eval  Shoulder extension   Shoulder flexion 123  Shoulder abduction 112  Shoulder internal rotation T7  Shoulder external rotation t3    (Blank rows = not tested)  CERVICAL AROM: All within normal limits:    Percent limited 11/08/22 12/20/2022  Flexion Fully flexed    Extension From flexed position decreased 80% Decreased 70% Decreased 40%  Right lateral flexion     Left  lateral flexion     Right rotation Dec 50% sitting in FH posture  Dec 50% from forward head posture  Left rotation        Dec 50%  Decreased 50% from forward  head posture    UPPER EXTREMITY STRENGTH:    LOWER EXTREMITY AROM/PROM:  A/PROM Right eval  Hip flexion   Hip extension   Hip abduction   Hip adduction   Hip internal rotation   Hip external rotation   Knee flexion   Knee extension   Ankle dorsiflexion   Ankle plantarflexion   Ankle inversion   Ankle eversion    (Blank rows = not tested)  A/PROM LEFT eval  Hip flexion   Hip extension   Hip abduction   Hip adduction   Hip internal rotation   Hip external rotation   Knee flexion   Knee extension   Ankle dorsiflexion   Ankle plantarflexion   Ankle inversion   Ankle eversion    (Blank rows = not tested)  LOWER EXTREMITY MMT:    10/04/2022  4 position balance test; able to hold DLS feet together , and half tandem stance 10 seconds. Unable to maintain full tandem stance for 10 sec, unable to hold SLS greater than a second or 2 on left, and 4 seconds on right.     LYMPHEDEMA ASSESSMENTS:   SURGERY TYPE/DATE: 8/17/23Left prox humerus fixation for pathologic fx  2/2/2024Fusion T1-T4 for pathologic fx with cord compression 06/04/22 posterior cervical fusion/foraminotomy;T1-4 fusion NUMBER OF LYMPH NODES REMOVED: NA  CHEMOTHERAPY: Velcade/promalyst/Dex;  RADIATION:to prox humerus and thoracic spine pathologic fx  HORMONE TREATMENT: NA  INFECTIONS: NA  FUNCTIONAL TESTS:  10/09/2022;30 second sit to stand 10 with heavy use of arms  GAIT: Distance walked: 20 Assistive device utilized: carried SPC in left Level of assistance: Complete Independence Comments: feels unsteady, very flexed neck posture   QUICK DASH SURVEY:    TODAY'S TREATMENT:                                                                                                                                         DATE:  12/20/2022 Nu Step;seat 12, UE 8, Lev 4 x 8 min, 611 steps, O2 96, HR 58,  week. Able to perform sit to stand x 5 without Ue's but very difficult and has trouble getting his weight  forward over his feet. Tandem stance right LE forward 18 sec,  Left 14.98 sec, SLS B x 2 to failure Step up on ax forward x 10 ea Checked goals;assessed shoulder and cervical ROM Manual Therapy STM seated in chair with cocoa butter to bil UT/upper scapular regions, and cervical and thoracic paraspinals where pt palpably very tight. 12/08/22:Pt seen for aquatic therapy today.  Treatment took place in water 3.5-4.75 ft in depth at the [facility] pool. Temp of water was 91.  Pt entered/exited the pool via  stairs with bilat rail independently. Pt requires buoyancy of water for support and to offload joints with strengthening exercises.Pt utilizes viscosity of the water required for strengthening.   Seated water bench with 75% submersion Pt performed seated LE AROM exercises 20x in all planes, with concurrent discussion of his current health status and pain assessment. 75% depth water walking with single buoy UE wts for push/pull 10 lengths. 3 way stance: parallel, tandem both sides with hand paddles: 10x2 bil arms forward & backwards. Hip 3 way kicks 15x Bil holding onto wall for balance with VC to push water a bit faster.  Single leg press with small noodle 2x10 Bil with balance assist. Horizontal float with concurrent skin rolling on upper back and lateral sway for trunk stretching by PTA.   12/06/22: Therapeutic Exercises and Neuro Re Ed In // bars: 6" step ups x 10 each; SpO2 94% and HR 60 bpm Nu step seat 12, UE 8, lev 4 x 8 min, 660 steps, 96% O2 , HR 58 bpm In // bars for following: Mini Squats 2 x 10; controlled high knee marching x 4 length of bars; standing on blue oval for 3 way hip raises with VC's for head posture throughout 2 x 10 each; between sets SpO2 97% and HR 59-60 bpm then HR 56 bpm after second set. Green theraband for bil scap retract x 20 then bil UE ext x 20, next attempted bil er and orz abd with back against wall but Rt shoulder pain and compensations so stopped  Wall  Push Ups x 20 with good technique, seated rest SpO2 97% and HR 59-60 bpm, RPE 3 Stand in corner of wall on Airex for ball toss in multiple directions. Minor LOB that pt was able to self correct with use of wall Manual Therapy STM seated in chair with cocoa butter to bil UT/upper scapular regions, and cervical and thoracic paraspinals where pt palpably very tight.  12/03/22:Pt seen for aquatic therapy today.  Treatment took place in water 3.5-4.75 ft in depth at the [facility] pool. Temp of water was 91.  Pt entered/exited the pool via stairs with bilat rail independently. Pt requires buoyancy of water for support and to offload joints with strengthening exercises.Pt utilizes viscosity of the water required for strengthening.   Seated water bench with 75% submersion Pt performed seated LE AROM exercises 20x in all planes, with concurrent discussion of his current health status and pain assessment. 75% depth water walking with single buoy UE wts for push/pull 10 lengths. 3 way stance: parallel, tandem both sides with hand paddles: 10x2 bil arms forward & backwards. Hip 3 way kicks 15x Bil holding onto wall for balance with VC to push water a bit faster.  Horizontal float with concurrent skin rolling on upper back and lateral sway for trunk stretching by PTA.   11/29/22: Therapeutic Exercises and Neuro Re Ed In // bars: 6" step ups x 10 each; after SpO2 97%, HR 71 bpm  Nu step seat 12, UE 8, lev 4 x 8 min, 710 steps, 97% O2 , HR 90-95 bpm In // bars for following: Mini Squats 2 x 10; slow, controlled high knee marching x 3 length of bars; standing on blue oval for 3 way hip raises x 10 each returning therapist demo for each and tactile cues to keep Rt knee extended with ext; short seated rest to check vitals SpO2 98% and HR 90-99 BPM after Green theraband (red was too easy) for bil scap  retract x 20 then bil UE ext x 20, seated rest after   Wall Push Ups 2 x 10, pt reports liking these and plans  to add them to his HEP Manual Therapy STM seated in chair with cocoa butter to bil UT/upper scapular regions, and cervical and thoracic paraspinals where pt palpably very tight.   PATIENT EDUCATION:  Education details: sitting posture with lumbar roll Person educated: Patient Education method: Medical illustrator Education comprehension: verbalized understanding and returned demonstration  HOME EXERCISE PROGRAM: Doing HEP from HHPT; cervical ROM, shoulder rolls, shrugs, scap retraction, UT stretches  Aquatic Access Code: [email or web address] URL: [email or web address] Date: 11/09/2022 Prepared by: [facility] - [address] This aquatic home exercise program from MedBridge utilizes pictures from land based exercises, but has been adapted prior to lamination and issuance.    ASSESSMENT:  CLINICAL IMPRESSION:  Pt has achieved 2 STG's and 2 LTG's established at initial evaluation. He was able to maintain tandem stance bilaterally greater than 10 seconds ea, and he was able to perform 5 sit to stands without use of his hands.  Despite being able to rise x 5 without hands he has noted difficulty shifting his wt forward to get his head out over his feet. His neck extension has improved, however,he is not strong enough to maintain it and he continues with significant round shouldered forward head posture. This puts a strain on his neck and upper back muscles. He had a recent fall last week, and despite encouragement to get an Xray he said he is improving and was able to do activities in therapy today without increased pain. He will continue to benefit from skilled PT to address remaining goals to return to his PLOF and decrease his fall risk   OBJECTIVE IMPAIRMENTS: decreased activity tolerance, decreased balance, difficulty walking, decreased ROM, decreased strength, increased edema, impaired UE functional use, postural dysfunction, and pain.   ACTIVITY LIMITATIONS:  carrying, lifting, dressing, reach over head, and locomotion level  PARTICIPATION LIMITATIONS: cleaning, laundry, driving, and occupation  PERSONAL FACTORS:  Multiple Myeloma  are also affecting patient's functional outcome.   REHAB POTENTIAL: Good  CLINICAL DECISION MAKING: Evolving/moderate complexity  EVALUATION COMPLEXITY: Moderate  GOALS: Goals reviewed with patient? Yes  SHORT TERM GOALS: Target date: 10/21/22  Pt will be educated in and independent in proper sitting posture using lumbar roll prn Baseline: Goal status; MET 11/08/22 2.  Pt will report decreased muscle soreness in bilateral upper back x 25% Baseline:  Goal status: ONGOING  3.  Pt will be compliant with a HEP for postural strengthening/ROM Baseline:  Goal status: MET / LONG TERM GOALS: Target date: 12/20/22  Pt will be able to bring his head to neutral position for safety with ambulation/driving Baseline:  Goal status:Ongoing( but improved) 2.  Pt will have decreased upper back pain by 50% or more Baseline:  Goal status:Ongoing 3.  Pt will be able to flex right shoulder to 115 degrees with minimal compensation for improved reaching ability Baseline:  Goal status: ONGOING 4; Pt will be able to maintain tandem stance on both sides x 10 sec to demonstrated improved balance. Goal status:MET right foot forward 11/08/2022, MET both legs 12/20/2022  5. Pt will be able to perform sit to stand x 5 without use of Ue's  Baseline:must use hands  Goal status:MET 12/20/2022 but difficult 6.  Pt will be able to maintain SLS on ea leg x 10 seconds without LOB  GOAL: NEW  PT FREQUENCY: 2x/week  PT DURATION: 6 weeks  PLANNED INTERVENTIONS: Therapeutic exercises, Therapeutic activity, Neuromuscular re-education, Balance training, Gait training, Patient/Family education, Self Care, Joint mobilization, Aquatic Therapy, Manual therapy, and Re-evaluation  PLAN FOR NEXT SESSION: Cont land and aquatic exercises working on  posture, strength, balance, STM prn for pain  Alvira Monday, PT 12/20/22 5:17 PM

## 2022-12-20 NOTE — Progress Notes (Unsigned)
  Electrophysiology Office Note:   Date:  12/21/2022  ID:  Travis [name], Travis [name] 1950-11-08, MRN [id number]  Primary Cardiologist: [name], MD Electrophysiologist: Lanier Prude, MD      History of Present Illness:   Travis [name] is a 72 y.o. male with h/o HTN, OSa, PAF, DVT, peripheral neuropathy, bradycardia, and multiple myeloma seen today for routine electrophysiology followup.   Has been having issues with volume overload. Normal rhythm on exam today. AF ~50% on monitor last month  Since last being seen in our clinic the patient reports doing about the same. Continues to struggle on and off with fluid overload.  Drinking upwards of 80 oz of fluid a day. Has some fatigue. Working with PT and HR doesn't get too far out of the 50s.  WAs into 70s on monitor last month.   Review of systems complete and found to be negative unless listed in HPI.   EP Information / Studies Reviewed:    EKG is not ordered today. EKG from 11/05/2022 reviewed which showed sinus bradycardia at 45 bpm, iRBBB    Monitor 11/2022 with AF burden 47% Longest episode 1d10h, fastest HR 158 bpm, AVG HR 76 bpm  Echo 09/08/2022 LVEF 60-65%, severe LAE   Physical Exam:   VS:  BP (!) 124/52   Pulse (!) 52   Ht 5\' 10"  (1.778 m)   Wt 251 lb 9.6 oz (114.1 kg)   SpO2 94%   BMI 36.10 kg/m    Wt Readings from Last 3 Encounters:  12/21/22 251 lb 9.6 oz (114.1 kg)  12/15/22 246 lb 9.6 oz (111.9 kg)  12/14/22 263 lb (119.3 kg)     GEN: Well nourished, well developed in no acute distress NECK: No JVD; No carotid bruits CARDIAC: Regular rate and rhythm, no murmurs, rubs, gallops RESPIRATORY:  Clear to auscultation without rales, wheezing or rhonchi  ABDOMEN: Soft, non-tender, non-distended EXTREMITIES:  No edema; No deformity   ASSESSMENT AND PLAN:    Persistent Atrial Fibrillation/atrial flutter Normal rhythm on exam today.  AF burden ~50% by monitor last month. Pt states he hasn't had any fib apart from  brief palpitations since taking off the monitor.  Continue Eliquis 5 mg BID for CHA2DS2VASc  of at least 3.  S/p afib ablation 04/05/17, potentially interested in redo.  Continue amiodarone 200 mg daily Continue metoprolol as needed.    Obesity Body mass index is 36.1 kg/m.  Encouraged lifestyle modification    OSA  Encouraged nightly CPAP  HTN Stable on current regimen    Multiple Myeloma Followed by Dr Leonides Schanz   Chronic diastolic CHF Volume status mildly overloaded.  He is drinking a significant amount of fluid. 4 cans of sparkling water, a can of soda, 1-2 coffees, milk with cereal most days and "lots" of water with his meds. Continue torsemide 40 mg daily Currently on metolazone 2.5 mg every other day. Long term would like to be able to decrease this to as needed.  Labs tomorrow at cancer center.   Follow up with Dr. Lalla Brothers in 6 months  Signed, Graciella Freer, PA-C

## 2022-12-21 ENCOUNTER — Ambulatory Visit: Payer: Medicare Other | Attending: Physician Assistant | Admitting: Student

## 2022-12-21 ENCOUNTER — Encounter: Payer: Self-pay | Admitting: Student

## 2022-12-21 VITALS — BP 124/52 | HR 52 | Ht 70.0 in | Wt 251.6 lb

## 2022-12-21 DIAGNOSIS — I10 Essential (primary) hypertension: Secondary | ICD-10-CM | POA: Diagnosis not present

## 2022-12-21 DIAGNOSIS — R600 Localized edema: Secondary | ICD-10-CM | POA: Diagnosis not present

## 2022-12-21 DIAGNOSIS — I5033 Acute on chronic diastolic (congestive) heart failure: Secondary | ICD-10-CM | POA: Diagnosis not present

## 2022-12-21 DIAGNOSIS — I480 Paroxysmal atrial fibrillation: Secondary | ICD-10-CM

## 2022-12-21 DIAGNOSIS — I4891 Unspecified atrial fibrillation: Secondary | ICD-10-CM | POA: Diagnosis not present

## 2022-12-21 NOTE — Patient Instructions (Signed)
Medication Instructions:  Your physician recommends that you continue on your current medications as directed. Please refer to the Current Medication list given to you today.  *If you need a refill on your cardiac medications before your next appointment, please call your pharmacy*  Lab Work: None ordered If you have labs (blood work) drawn today and your tests are completely normal, you will receive your results only by: MyChart Message (if you have MyChart) OR A paper copy in the mail If you have any lab test that is abnormal or we need to change your treatment, we will call you to review the results.  Follow-Up: At [facility], you and your health needs are our priority.  As part of our continuing mission to provide you with exceptional heart care, we have created designated Provider Care Teams.  These Care Teams include your primary Cardiologist (physician) and Advanced Practice Providers (APPs -  Physician Assistants and Nurse Practitioners) who all work together to provide you with the care you need, when you need it.  Your next appointment:   6 month(s)  Provider:   Cameron Lambert, MD  

## 2022-12-22 ENCOUNTER — Telehealth: Payer: Self-pay | Admitting: *Deleted

## 2022-12-22 ENCOUNTER — Inpatient Hospital Stay: Payer: Medicare Other

## 2022-12-22 VITALS — BP 124/58 | HR 62 | Temp 98.2°F | Resp 18

## 2022-12-22 DIAGNOSIS — M533 Sacrococcygeal disorders, not elsewhere classified: Secondary | ICD-10-CM | POA: Diagnosis not present

## 2022-12-22 DIAGNOSIS — Z8249 Family history of ischemic heart disease and other diseases of the circulatory system: Secondary | ICD-10-CM | POA: Diagnosis not present

## 2022-12-22 DIAGNOSIS — I480 Paroxysmal atrial fibrillation: Secondary | ICD-10-CM | POA: Diagnosis not present

## 2022-12-22 DIAGNOSIS — Z923 Personal history of irradiation: Secondary | ICD-10-CM | POA: Diagnosis not present

## 2022-12-22 DIAGNOSIS — Z7952 Long term (current) use of systemic steroids: Secondary | ICD-10-CM | POA: Diagnosis not present

## 2022-12-22 DIAGNOSIS — M48061 Spinal stenosis, lumbar region without neurogenic claudication: Secondary | ICD-10-CM | POA: Diagnosis not present

## 2022-12-22 DIAGNOSIS — Z86718 Personal history of other venous thrombosis and embolism: Secondary | ICD-10-CM | POA: Diagnosis not present

## 2022-12-22 DIAGNOSIS — C9000 Multiple myeloma not having achieved remission: Secondary | ICD-10-CM | POA: Diagnosis not present

## 2022-12-22 DIAGNOSIS — M7989 Other specified soft tissue disorders: Secondary | ICD-10-CM | POA: Diagnosis not present

## 2022-12-22 DIAGNOSIS — I10 Essential (primary) hypertension: Secondary | ICD-10-CM | POA: Diagnosis not present

## 2022-12-22 DIAGNOSIS — Z9089 Acquired absence of other organs: Secondary | ICD-10-CM | POA: Diagnosis not present

## 2022-12-22 DIAGNOSIS — Z7901 Long term (current) use of anticoagulants: Secondary | ICD-10-CM | POA: Diagnosis not present

## 2022-12-22 DIAGNOSIS — Z7961 Long term (current) use of immunomodulator: Secondary | ICD-10-CM | POA: Diagnosis not present

## 2022-12-22 DIAGNOSIS — I5033 Acute on chronic diastolic (congestive) heart failure: Secondary | ICD-10-CM | POA: Diagnosis not present

## 2022-12-22 DIAGNOSIS — Z823 Family history of stroke: Secondary | ICD-10-CM | POA: Diagnosis not present

## 2022-12-22 DIAGNOSIS — E876 Hypokalemia: Secondary | ICD-10-CM | POA: Diagnosis not present

## 2022-12-22 DIAGNOSIS — Z881 Allergy status to other antibiotic agents status: Secondary | ICD-10-CM | POA: Diagnosis not present

## 2022-12-22 DIAGNOSIS — Z79899 Other long term (current) drug therapy: Secondary | ICD-10-CM | POA: Diagnosis not present

## 2022-12-22 DIAGNOSIS — G4733 Obstructive sleep apnea (adult) (pediatric): Secondary | ICD-10-CM | POA: Diagnosis not present

## 2022-12-22 DIAGNOSIS — R1310 Dysphagia, unspecified: Secondary | ICD-10-CM | POA: Diagnosis not present

## 2022-12-22 DIAGNOSIS — M5126 Other intervertebral disc displacement, lumbar region: Secondary | ICD-10-CM | POA: Diagnosis not present

## 2022-12-22 LAB — CBC WITH DIFFERENTIAL (CANCER CENTER ONLY)
Abs Immature Granulocytes: 0.08 10*3/uL — ABNORMAL HIGH (ref 0.00–0.07)
Basophils Absolute: 0.1 10*3/uL (ref 0.0–0.1)
Basophils Relative: 1 %
Eosinophils Absolute: 0 10*3/uL (ref 0.0–0.5)
Eosinophils Relative: 0 %
HCT: 36.9 % — ABNORMAL LOW (ref 39.0–52.0)
Hemoglobin: 11.9 g/dL — ABNORMAL LOW (ref 13.0–17.0)
Immature Granulocytes: 1 %
Lymphocytes Relative: 3 %
Lymphs Abs: 0.4 10*3/uL — ABNORMAL LOW (ref 0.7–4.0)
MCH: 30 pg (ref 26.0–34.0)
MCHC: 32.2 g/dL (ref 30.0–36.0)
MCV: 92.9 fL (ref 80.0–100.0)
Monocytes Absolute: 0.4 10*3/uL (ref 0.1–1.0)
Monocytes Relative: 4 %
Neutro Abs: 11 10*3/uL — ABNORMAL HIGH (ref 1.7–7.7)
Neutrophils Relative %: 91 %
Platelet Count: 237 10*3/uL (ref 150–400)
RBC: 3.97 MIL/uL — ABNORMAL LOW (ref 4.22–5.81)
RDW: 16.7 % — ABNORMAL HIGH (ref 11.5–15.5)
WBC Count: 12 10*3/uL — ABNORMAL HIGH (ref 4.0–10.5)
nRBC: 0 % (ref 0.0–0.2)

## 2022-12-22 LAB — CMP (CANCER CENTER ONLY)
ALT: 17 U/L (ref 0–44)
AST: 12 U/L — ABNORMAL LOW (ref 15–41)
Albumin: 4 g/dL (ref 3.5–5.0)
Alkaline Phosphatase: 102 U/L (ref 38–126)
Anion gap: 9 (ref 5–15)
BUN: 36 mg/dL — ABNORMAL HIGH (ref 8–23)
CO2: 35 mmol/L — ABNORMAL HIGH (ref 22–32)
Calcium: 9.2 mg/dL (ref 8.9–10.3)
Chloride: 99 mmol/L (ref 98–111)
Creatinine: 1.06 mg/dL (ref 0.61–1.24)
GFR, Estimated: >60 mL/min (ref >60)
Glucose, Bld: 151 mg/dL — ABNORMAL HIGH (ref 70–99)
Potassium: 3 mmol/L — ABNORMAL LOW (ref 3.5–5.1)
Sodium: 143 mmol/L (ref 135–145)
Total Bilirubin: 0.4 mg/dL (ref 0.3–1.2)
Total Protein: 6.4 g/dL — ABNORMAL LOW (ref 6.5–8.1)

## 2022-12-22 MED ORDER — BORTEZOMIB CHEMO SQ INJECTION 3.5 MG (2.5MG/ML)
1.3000 mg/m2 | Freq: Once | INTRAMUSCULAR | Status: AC
  Administered 2022-12-22: 3 mg via SUBCUTANEOUS
  Filled 2022-12-22: qty 1.2

## 2022-12-22 NOTE — Telephone Encounter (Signed)
Received call from pt. He states he had a fall last Thursday and has had mid and lower back pain since that time. He saw his physical therapist om Monday and apparently she recommended he get xrays of his back. Discussed with Dr. Leonides Schanz. He recommended pt get CT scan of thoracic and lumbar spine. Pt made aware of that Advised that hopefully he will get it done within a week or so.  Pt is agreeable to this plan.

## 2022-12-23 ENCOUNTER — Other Ambulatory Visit: Payer: Self-pay | Admitting: Hematology and Oncology

## 2022-12-23 DIAGNOSIS — C9000 Multiple myeloma not having achieved remission: Secondary | ICD-10-CM

## 2022-12-24 ENCOUNTER — Ambulatory Visit: Payer: Medicare Other | Admitting: Physical Therapy

## 2022-12-24 ENCOUNTER — Encounter: Payer: Self-pay | Admitting: Physical Therapy

## 2022-12-24 DIAGNOSIS — M25511 Pain in right shoulder: Secondary | ICD-10-CM | POA: Diagnosis not present

## 2022-12-24 DIAGNOSIS — M6281 Muscle weakness (generalized): Secondary | ICD-10-CM

## 2022-12-24 DIAGNOSIS — R278 Other lack of coordination: Secondary | ICD-10-CM

## 2022-12-24 DIAGNOSIS — M542 Cervicalgia: Secondary | ICD-10-CM | POA: Diagnosis not present

## 2022-12-24 DIAGNOSIS — R293 Abnormal posture: Secondary | ICD-10-CM

## 2022-12-24 DIAGNOSIS — C9000 Multiple myeloma not having achieved remission: Secondary | ICD-10-CM

## 2022-12-24 DIAGNOSIS — S22000A Wedge compression fracture of unspecified thoracic vertebra, initial encounter for closed fracture: Secondary | ICD-10-CM | POA: Diagnosis not present

## 2022-12-24 DIAGNOSIS — G8929 Other chronic pain: Secondary | ICD-10-CM

## 2022-12-24 NOTE — Therapy (Signed)
OUTPATIENT PHYSICAL THERAPY ONCOLOGY TREATMENT  Patient Name: Travis [name] MRN: [id number] DOB:1950/06/24, 72 y.o., male Today's Date: 12/24/2022   END OF SESSION:  PT End of Session - 12/24/22 1603     Visit Number 22    Number of Visits 29    Date for PT Re-Evaluation 01/17/23    PT Start Time 1515    PT Stop Time 1600    PT Time Calculation (min) 45 min    Activity Tolerance Patient tolerated treatment well    Behavior During Therapy [facility] for tasks assessed/performed                   Past Medical History:  Diagnosis Date   A-fib [facility])    Arthritis    "comes and goes; mostly in hands, occasionally elbow" (04/05/2017)   Complication of anesthesia    "just wanted to sleep alot  for couple days S/P VARICOCELE OR" (04/05/2017)   DVT (deep venous thrombosis) (HCC)    LLE   Dysrhythmia    PVCs    GERD (gastroesophageal reflux disease)    History of hiatal hernia    History of radiation therapy    Thoracic Spine- 07/13/22-07/23/22-Dr. Antony Blackbird   Hypertension    Impaired glucose tolerance    Multiple myeloma (HCC)    Obesity (BMI 30-39.9) 07/26/2016   OSA on CPAP 07/26/2016   Mild with AHI 12.5/hr now on CPAP at 14cm H2O   PAF (paroxysmal atrial fibrillation) (HCC)    s/p PVI Ablation in 2018 // Flecainide Rx // Echo 8/21: EF 60-65, no RWMA, mild LVH, normal RV SF, moderate RVE, mild BAE, trivial MR, mild dilation of aortic root (40 mm)   Pneumonia    "may have had walking pneumonia a few years ago" (04/05/2017)   Pre-diabetes    Thrombophlebitis    Past Surgical History:  Procedure Laterality Date   ATRIAL FIBRILLATION ABLATION  04/05/2017   ATRIAL FIBRILLATION ABLATION N/A 04/05/2017   Procedure: ATRIAL FIBRILLATION ABLATION;  Surgeon: [name], MD;  Location: [facility];  Service: Cardiovascular;  Laterality: N/A;   BONE BIOPSY Right 12/17/2021   Procedure: BONE BIOPSY;  Surgeon: [name], MD;  Location: [facility];   Service: Orthopedics;  Laterality: Right;   COMPLETE RIGHT HIP REPLACMENT  01/19/2019   EVALUATION UNDER ANESTHESIA WITH HEMORRHOIDECTOMY N/A 02/24/2017   Procedure: EXAM UNDER ANESTHESIA WITH  HEMORRHOIDECTOMY;  Surgeon: [name], MD;  Location: [facility];  Service: General;  Laterality: N/A;   EXCISIONAL TOTAL HIP ARTHROPLASTY WITH ANTIBIOTIC SPACERS Right 09/25/2020   Procedure: EXCISIONAL TOTAL HIP ARTHROPLASTY WITH ANTIBIOTIC SPACERS;  Surgeon: [name], MD;  Location: [facility];  Service: Orthopedics;  Laterality: Right;   FLEXIBLE SIGMOIDOSCOPY N/A 04/15/2015   Procedure:  UNSEDATED FLEXIBLE SIGMOIDOSCOPY;  Surgeon: [name], MD;  Location: [facility];  Service: Endoscopy;  Laterality: N/A;   HUMERUS IM NAIL Right 12/17/2021   Procedure: INTRAMEDULLARY (IM) NAIL HUMERAL;  Surgeon: [name], MD;  Location: [facility];  Service: Orthopedics;  Laterality: Right;   KNEE ARTHROSCOPY Left ~ 2016   POSTERIOR CERVICAL FUSION/FORAMINOTOMY N/A 06/04/2022   Procedure: Thoracic one - Thoracic Four Posterior lateral arthrodesis/ fusion and Thoracic two Corpectomy;  Surgeon: [name], MD;  Location: [facility];  Service: Neurosurgery;  Laterality: N/A;   REIMPLANTATION OF TOTAL HIP Right 12/25/2020   Procedure: REIMPLANTATION/REVISION OF RIGHT TOTAL HIP VERSUS REPEAT IRRIGATION AND DEBRIDEMENT;  Surgeon: [name],  MD;  Location: [facility];  Service: Orthopedics;  Laterality: Right;   TESTICLE SURGERY  1988   VARICOCELE   TONSILLECTOMY     VARICOSE VEIN SURGERY Bilateral    Patient Active Problem List   Diagnosis Date Noted   Pancreatic cyst 09/30/2022   Infection due to novel influenza A virus 09/29/2022   Persistent atrial fibrillation (HCC) 07/15/2022   Acute on chronic diastolic heart failure (HCC) 07/03/2022   Pulmonary infiltrates 07/01/2022   Acute respiratory failure with hypoxia (HCC) 07/01/2022   Hyponatremia 06/29/2022   History of thoracic surgery  06/29/2022   Acute DVT of left tibial vein (HCC) 06/21/2022   Hypokalemia 06/21/2022   Vitamin D deficiency 05/30/2022   Other constipation 05/29/2022   Pathologic compression fracture of spine, initial encounter (HCC) 05/29/2022   Cord compression (HCC) 05/28/2022   Degenerative cervical disc 05/14/2022   Hypercoagulable state due to persistent atrial fibrillation (HCC) 01/11/2022   Multiple myeloma (HCC) 12/10/2021   Medication monitoring encounter 02/27/2021   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   Status post peripherally inserted central catheter (PICC) central line placement 11/06/2020   Drug rash with eosinophilia and systemic symptoms syndrome 11/06/2020   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   PAF (paroxysmal atrial fibrillation) (HCC)    GERD (gastroesophageal reflux disease) 03/29/2018   Hiatal hernia 03/29/2018   History of DVT (deep vein thrombosis) 03/29/2018   CAP (community acquired pneumonia) 03/29/2018   Osteoarthritis 03/29/2018   Neuropathy 08/29/2017   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   Paroxysmal atrial fibrillation with rapid ventricular response (HCC) 04/05/2017   Prolapsed internal hemorrhoids, grade 4, s/p ligation/pexy/hemorrhoidectomy x 2 02/24/2017 02/24/2017   OSA (obstructive sleep apnea) 07/26/2016   Obesity (BMI 30-39.9) 07/26/2016   Atrial fibrillation with RVR (HCC)    Essential hypertension    Chest pain at rest     PCP: Eleanora Neighbor, MD  REFERRING PROVIDER: Antony Blackbird, MD  REFERRING DIAG: Multiple Myeloma  THERAPY DIAG:  Compression fracture of thoracic vertebra, unspecified thoracic vertebral level, initial encounter (HCC)  Multiple myeloma not having achieved remission (HCC)  Muscle weakness (generalized)  Abnormal posture  Neck pain  Other lack of coordination  Chronic right shoulder pain  ONSET DATE: 2023  Rationale for Evaluation and Treatment: Rehabilitation  SUBJECTIVE:                                                                                                                                                                                            SUBJECTIVE STATEMENT:Just sore from my fall but it is getting better. I am going to have  some xrays next week for precaution.  PERTINENT HISTORY:  JOSEP DALPE 72 y.o. male with medical history significant for IgA Kappa Multiple Myeloma. He was diagnosed with smouldering myeloma in 2018 . Last July he developed right arm pain due to a pathologic fx and he underwent radiation and fixation. In early Feb 2024 he had radiation for pathologic T2 compression fx followed by a T1-T4 fusion. PAIN:  Are you having pain?  PAIN:  Are you having pain? Yes NPRS scale: 3/10 present, 5/10 at worst., 3-4/10 neck/upper back Pain location: Left greater than right LB Pain orientation: Left  PAIN TYPE: aching Pain description: constant  Aggravating factors: standing,rising from chair Relieving factors: recliner, lying down,  PRECAUTIONS: Multiple Myeloma with mets to R ribs, R humerus with ORIF for pathological fracture 12/17/21. Lesions have also been found in R rib and also in his skull. Pathologic fx T2 with fusion T1-4 on 06/04/22, Bilateral LE edema (on Lasix), Afib   WEIGHT BEARING RESTRICTIONS: No  FALLS:  Has patient fallen in last 6 months? Yes. Number of falls 1  LIVING ENVIRONMENT: Lives with: lives with their family and lives with their spouse Lives in: House/apartment Stairs: Yes; Internal: 14 steps; on left going up and External: 3 steps; on right going up Has following equipment at home: Single point cane and Walker - 4 wheeled  OCCUPATION: attorney/ mostly retired?  LEISURE: golf (can't do now.), use to play racquetball  HAND DOMINANCE: right   PRIOR LEVEL OF FUNCTION: Independent  POSTURE:significant slump sitting posture with forward head  PATIENT GOALS: decrease pain,get stronger,improve  balance   OBJECTIVE:  COGNITION: Overall cognitive status: Within functional limits for tasks assessed   PALPATION: Very tender bilateral UT/levator, scapular regions  OBSERVATIONS / OTHER ASSESSMENTS: very flexed head and neck with chin nearly at chest  SENSATION: Light touch: Appears intact    POSTURE: significant forward head, round shoulders, increased kyphosis   Gait holding single point cane in left hand but not using. Right shoulder low, flexed neck;difficulty raising head to see where he is going  UPPER EXTREMITY AROM/PROM:  A/PROM RIGHT   eval  RIGHT 11/08/2022 RIGHT 12/20/2022  Shoulder extension     Shoulder flexion 91 with compensation 101 with compensation  101 with compensation  Shoulder abduction 67 with compensation 75 with compensation 75 compensation  Shoulder internal rotation     Shoulder external rotation C6      (Blank rows = not tested)  A/PROM LEFT   eval  Shoulder extension   Shoulder flexion 123  Shoulder abduction 112  Shoulder internal rotation T7  Shoulder external rotation t3    (Blank rows = not tested)  CERVICAL AROM: All within normal limits:    Percent limited 11/08/22 12/20/2022  Flexion Fully flexed    Extension From flexed position decreased 80% Decreased 70% Decreased 40%  Right lateral flexion     Left lateral flexion     Right rotation Dec 50% sitting in FH posture  Dec 50% from forward head posture  Left rotation        Dec 50%  Decreased 50% from forward head posture    UPPER EXTREMITY STRENGTH:    LOWER EXTREMITY AROM/PROM:  A/PROM Right eval  Hip flexion   Hip extension   Hip abduction   Hip adduction   Hip internal rotation   Hip external rotation   Knee flexion   Knee extension   Ankle dorsiflexion   Ankle plantarflexion   Ankle inversion  Ankle eversion    (Blank rows = not tested)  A/PROM LEFT eval  Hip flexion   Hip extension   Hip abduction   Hip adduction   Hip internal rotation   Hip  external rotation   Knee flexion   Knee extension   Ankle dorsiflexion   Ankle plantarflexion   Ankle inversion   Ankle eversion    (Blank rows = not tested)  LOWER EXTREMITY MMT:    10/04/2022  4 position balance test; able to hold DLS feet together , and half tandem stance 10 seconds. Unable to maintain full tandem stance for 10 sec, unable to hold SLS greater than a second or 2 on left, and 4 seconds on right.     LYMPHEDEMA ASSESSMENTS:   SURGERY TYPE/DATE: 8/17/23Left prox humerus fixation for pathologic fx  2/2/2024Fusion T1-T4 for pathologic fx with cord compression 06/04/22 posterior cervical fusion/foraminotomy;T1-4 fusion NUMBER OF LYMPH NODES REMOVED: NA  CHEMOTHERAPY: Velcade/promalyst/Dex;  RADIATION:to prox humerus and thoracic spine pathologic fx  HORMONE TREATMENT: NA  INFECTIONS: NA  FUNCTIONAL TESTS:  10/09/2022;30 second sit to stand 10 with heavy use of arms  GAIT: Distance walked: 20 Assistive device utilized: carried SPC in left Level of assistance: Complete Independence Comments: feels unsteady, very flexed neck posture   QUICK DASH SURVEY:    TODAY'S TREATMENT:                                                                                                                                         DATE:   8/23/24Pt seen for aquatic therapy today.  Treatment took place in water 3.5-4.75 ft in depth at the [facility] pool. Temp of water was 91.  Pt entered/exited the pool via stairs with bilat rail independently. Pt requires buoyancy of water for support and to offload joints with strengthening exercises.Pt utilizes viscosity of the water required for strengthening.   Seated water bench with 75% submersion Pt performed seated LE AROM exercises 20x in all planes, with concurrent discussion of his current health status and pain assessment. 75% depth water walking with single buoy UE wts for push/pull 10 lengths. Hip 3 way kicks 15x Bil holding  onto wall for balance with VC to push water a bit faster and adding ankle fins for more resistance. Small marching across pool holding single buoy floats under water 4 lengths f/f tandem walking holding floats 4x across pool.  Single leg press with small noodle 2x10 Bil with balance assist. Horizontal float with concurrent skin rolling on upper back and lateral sway for trunk stretching by PTA.  12/20/2022 Nu Step;seat 12, UE 8, Lev 4 x 8 min, 611 steps, O2 96, HR 58,  week. Able to perform sit to stand x 5 without Ue's but very difficult and has trouble getting his weight forward over his feet. Tandem stance right LE forward 18 sec,  Left 14.98 sec, SLS  B x 2 to failure Step up on ax forward x 10 ea Checked goals;assessed shoulder and cervical ROM Manual Therapy STM seated in chair with cocoa butter to bil UT/upper scapular regions, and cervical and thoracic paraspinals where pt palpably very tight.  PATIENT EDUCATION:  Education details: sitting posture with lumbar roll Person educated: Patient Education method: Medical illustrator Education comprehension: verbalized understanding and returned demonstration  HOME EXERCISE PROGRAM: Doing HEP from HHPT; cervical ROM, shoulder rolls, shrugs, scap retraction, UT stretches  Aquatic Access Code: [email or web address] URL: [email or web address] Date: 11/09/2022 Prepared by: [facility] [address] This aquatic home exercise program from Med[employer] utilizes pictures from land based exercises, but has been adapted prior to lamination and issuance.    ASSESSMENT:  CLINICAL IMPRESSION: Annette Stable arrives with lessening soreness since his recent fall but is having precautionary xrays next week. By the end of the session pt had no neck soreness and could lift his chin fairly high (post float). Pt had difficulty with tandem walking.   OBJECTIVE IMPAIRMENTS: decreased activity tolerance, decreased balance, difficulty walking,  decreased ROM, decreased strength, increased edema, impaired UE functional use, postural dysfunction, and pain.   ACTIVITY LIMITATIONS: carrying, lifting, dressing, reach over head, and locomotion level  PARTICIPATION LIMITATIONS: cleaning, laundry, driving, and occupation  PERSONAL FACTORS:  Multiple Myeloma  are also affecting patient's functional outcome.   REHAB POTENTIAL: Good  CLINICAL DECISION MAKING: Evolving/moderate complexity  EVALUATION COMPLEXITY: Moderate  GOALS: Goals reviewed with patient? Yes  SHORT TERM GOALS: Target date: 10/21/22  Pt will be educated in and independent in proper sitting posture using lumbar roll prn Baseline: Goal status; MET 11/08/22 2.  Pt will report decreased muscle soreness in bilateral upper back x 25% Baseline:  Goal status: ONGOING  3.  Pt will be compliant with a HEP for postural strengthening/ROM Baseline:  Goal status: MET / LONG TERM GOALS: Target date: 12/20/22  Pt will be able to bring his head to neutral position for safety with ambulation/driving Baseline:  Goal status:Ongoing( but improved) 2.  Pt will have decreased upper back pain by 50% or more Baseline:  Goal status:Ongoing 3.  Pt will be able to flex right shoulder to 115 degrees with minimal compensation for improved reaching ability Baseline:  Goal status: ONGOING 4; Pt will be able to maintain tandem stance on both sides x 10 sec to demonstrated improved balance. Goal status:MET right foot forward 11/08/2022, MET both legs 12/20/2022  5. Pt will be able to perform sit to stand x 5 without use of Ue's  Baseline:must use hands  Goal status:MET 12/20/2022 but difficult 6.  Pt will be able to maintain SLS on ea leg x 10 seconds without LOB  GOAL: NEW  PT FREQUENCY: 2x/week  PT DURATION: 6 weeks  PLANNED INTERVENTIONS: Therapeutic exercises, Therapeutic activity, Neuromuscular re-education, Balance training, Gait training, Patient/Family education, Self Care, Joint  mobilization, Aquatic Therapy, Manual therapy, and Re-evaluation  PLAN FOR NEXT SESSION: Cont land and aquatic exercises working on posture, strength, balance, STM prn for pain  Ane Payment, PTA 12/24/22 5:19 PM

## 2022-12-27 ENCOUNTER — Other Ambulatory Visit: Payer: Self-pay | Admitting: Internal Medicine

## 2022-12-27 ENCOUNTER — Ambulatory Visit: Payer: Medicare Other

## 2022-12-27 DIAGNOSIS — R278 Other lack of coordination: Secondary | ICD-10-CM | POA: Diagnosis not present

## 2022-12-27 DIAGNOSIS — G8929 Other chronic pain: Secondary | ICD-10-CM | POA: Diagnosis not present

## 2022-12-27 DIAGNOSIS — C9000 Multiple myeloma not having achieved remission: Secondary | ICD-10-CM

## 2022-12-27 DIAGNOSIS — M25511 Pain in right shoulder: Secondary | ICD-10-CM | POA: Diagnosis not present

## 2022-12-27 DIAGNOSIS — S22000A Wedge compression fracture of unspecified thoracic vertebra, initial encounter for closed fracture: Secondary | ICD-10-CM | POA: Diagnosis not present

## 2022-12-27 DIAGNOSIS — M542 Cervicalgia: Secondary | ICD-10-CM | POA: Diagnosis not present

## 2022-12-27 DIAGNOSIS — R293 Abnormal posture: Secondary | ICD-10-CM

## 2022-12-27 DIAGNOSIS — M6281 Muscle weakness (generalized): Secondary | ICD-10-CM | POA: Diagnosis not present

## 2022-12-27 NOTE — Telephone Encounter (Signed)
Prescription refill request for Eliquis received. Indication:afib Last office visit:8/24 Scr:1.06  8/24 Age: 72 Weight:114.1  kg  Prescription refilled

## 2022-12-27 NOTE — Therapy (Signed)
OUTPATIENT PHYSICAL THERAPY ONCOLOGY TREATMENT  Patient Name: Travis [name] MRN: [id number] DOB:1950/06/24, 72 y.o., male Today's Date: 12/27/2022   END OF SESSION:  PT End of Session - 12/27/22 1102     Visit Number 23    Number of Visits 29    Date for PT Re-Evaluation 01/17/23    PT Start Time 1106    PT Stop Time 1200    PT Time Calculation (min) 54 min    Equipment Utilized During Treatment Gait belt    Activity Tolerance Patient tolerated treatment well    Behavior During Therapy WFL for tasks assessed/performed                   Past Medical History:  Diagnosis Date   A-fib (HCC)    Arthritis    "comes and goes; mostly in hands, occasionally elbow" (04/05/2017)   Complication of anesthesia    "just wanted to sleep alot  for couple days S/P VARICOCELE OR" (04/05/2017)   DVT (deep venous thrombosis) (HCC)    LLE   Dysrhythmia    PVCs    GERD (gastroesophageal reflux disease)    History of hiatal hernia    History of radiation therapy    Thoracic Spine- 07/13/22-07/23/22-Dr. Antony Blackbird   Hypertension    Impaired glucose tolerance    Multiple myeloma (HCC)    Obesity (BMI 30-39.9) 07/26/2016   OSA on CPAP 07/26/2016   Mild with AHI 12.5/hr now on CPAP at 14cm H2O   PAF (paroxysmal atrial fibrillation) (HCC)    s/p PVI Ablation in 2018 // Flecainide Rx // Echo 8/21: EF 60-65, no RWMA, mild LVH, normal RV SF, moderate RVE, mild BAE, trivial MR, mild dilation of aortic root (40 mm)   Pneumonia    "may have had walking pneumonia a few years ago" (04/05/2017)   Pre-diabetes    Thrombophlebitis    Past Surgical History:  Procedure Laterality Date   ATRIAL FIBRILLATION ABLATION  04/05/2017   ATRIAL FIBRILLATION ABLATION N/A 04/05/2017   Procedure: ATRIAL FIBRILLATION ABLATION;  Surgeon: [name], MD;  Location: [facility];  Service: Cardiovascular;  Laterality: N/A;   BONE BIOPSY Right 12/17/2021   Procedure: BONE BIOPSY;  Surgeon: [name], MD;  Location: [facility];  Service: Orthopedics;  Laterality: Right;   COMPLETE RIGHT HIP REPLACMENT  01/19/2019   EVALUATION UNDER ANESTHESIA WITH HEMORRHOIDECTOMY N/A 02/24/2017   Procedure: EXAM UNDER ANESTHESIA WITH  HEMORRHOIDECTOMY;  Surgeon: [name], MD;  Location: [facility];  Service: General;  Laterality: N/A;   EXCISIONAL TOTAL HIP ARTHROPLASTY WITH ANTIBIOTIC SPACERS Right 09/25/2020   Procedure: EXCISIONAL TOTAL HIP ARTHROPLASTY WITH ANTIBIOTIC SPACERS;  Surgeon: [name], MD;  Location: [facility];  Service: Orthopedics;  Laterality: Right;   FLEXIBLE SIGMOIDOSCOPY N/A 04/15/2015   Procedure:  UNSEDATED FLEXIBLE SIGMOIDOSCOPY;  Surgeon: [name], MD;  Location: [facility];  Service: Endoscopy;  Laterality: N/A;   HUMERUS IM NAIL Right 12/17/2021   Procedure: INTRAMEDULLARY (IM) NAIL HUMERAL;  Surgeon: [name], MD;  Location: [facility];  Service: Orthopedics;  Laterality: Right;   KNEE ARTHROSCOPY Left ~ 2016   POSTERIOR CERVICAL FUSION/FORAMINOTOMY N/A 06/04/2022   Procedure: Thoracic one - Thoracic Four Posterior lateral arthrodesis/ fusion and Thoracic two Corpectomy;  Surgeon: [name], MD;  Location: [facility];  Service: Neurosurgery;  Laterality: N/A;   REIMPLANTATION OF TOTAL HIP Right 12/25/2020   Procedure: REIMPLANTATION/REVISION OF RIGHT TOTAL HIP  VERSUS REPEAT IRRIGATION AND DEBRIDEMENT;  Surgeon: [name], MD;  Location: [facility];  Service: Orthopedics;  Laterality: Right;   TESTICLE SURGERY  1988   VARICOCELE   TONSILLECTOMY     VARICOSE VEIN SURGERY Bilateral    Patient Active Problem List   Diagnosis Date Noted   Pancreatic cyst 09/30/2022   Infection due to novel influenza A virus 09/29/2022   Persistent atrial fibrillation (HCC) 07/15/2022   Acute on chronic diastolic heart failure (HCC) 07/03/2022   Pulmonary infiltrates 07/01/2022   Acute respiratory failure with hypoxia (HCC) 07/01/2022    Hyponatremia 06/29/2022   History of thoracic surgery 06/29/2022   Acute DVT of left tibial vein (HCC) 06/21/2022   Hypokalemia 06/21/2022   Vitamin D deficiency 05/30/2022   Other constipation 05/29/2022   Pathologic compression fracture of spine, initial encounter (HCC) 05/29/2022   Cord compression (HCC) 05/28/2022   Degenerative cervical disc 05/14/2022   Hypercoagulable state due to persistent atrial fibrillation (HCC) 01/11/2022   Multiple myeloma (HCC) 12/10/2021   Medication monitoring encounter 02/27/2021   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   Status post peripherally inserted central catheter (PICC) central line placement 11/06/2020   Drug rash with eosinophilia and systemic symptoms syndrome 11/06/2020   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   PAF (paroxysmal atrial fibrillation) (HCC)    GERD (gastroesophageal reflux disease) 03/29/2018   Hiatal hernia 03/29/2018   History of DVT (deep vein thrombosis) 03/29/2018   CAP (community acquired pneumonia) 03/29/2018   Osteoarthritis 03/29/2018   Neuropathy 08/29/2017   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   Paroxysmal atrial fibrillation with rapid ventricular response (HCC) 04/05/2017   Prolapsed internal hemorrhoids, grade 4, s/p ligation/pexy/hemorrhoidectomy x 2 02/24/2017 02/24/2017   OSA (obstructive sleep apnea) 07/26/2016   Obesity (BMI 30-39.9) 07/26/2016   Atrial fibrillation with RVR (HCC)    Essential hypertension    Chest pain at rest     PCP: Eleanora Neighbor, MD  REFERRING PROVIDER: Antony Blackbird, MD  REFERRING DIAG: Multiple Myeloma  THERAPY DIAG:  Compression fracture of thoracic vertebra, unspecified thoracic vertebral level, initial encounter (HCC)  Multiple myeloma not having achieved remission (HCC)  Muscle weakness (generalized)  Abnormal posture  Neck pain  Other lack of coordination  Chronic right shoulder pain  ONSET DATE: 2023  Rationale for  Evaluation and Treatment: Rehabilitation  SUBJECTIVE:                                                                                                                                                                                           SUBJECTIVE STATEMENT: I was no worse after  last clinic visit, but I was very sore after last aquatic. Today my back feels better. I did my walking and my bands yesterday, and walked Saturday.  I have a CT schedule for Thursday for my spine. I can't tell if the LBP is from my fall or its muscles. My BP was down today 117/52, and my HR was 46  PERTINENT HISTORY:  [name] 72 y.o. male with medical history significant for IgA Kappa Multiple Myeloma. He was diagnosed with smouldering myeloma in 2018 . Last July he developed right arm pain due to a pathologic fx and he underwent radiation and fixation. In early Feb 2024 he had radiation for pathologic T2 compression fx followed by a T1-T4 fusion. PAIN:  Are you having pain?  PAIN:  Are you having pain? Yes NPRS scale:  4/10 neck/upper back, 2/10 LB presently Pain location: Left greater than right LB Pain orientation: Left  PAIN TYPE: aching Pain description: constant  Aggravating factors: standing,rising from chair Relieving factors: recliner, lying down,  PRECAUTIONS: Multiple Myeloma with mets to R ribs, R humerus with ORIF for pathological fracture 12/17/21. Lesions have also been found in R rib and also in his skull. Pathologic fx T2 with fusion T1-4 on 06/04/22, Bilateral LE edema (on Lasix), Afib   WEIGHT BEARING RESTRICTIONS: No  FALLS:  Has patient fallen in last 6 months? Yes. Number of falls 1  LIVING ENVIRONMENT: Lives with: lives with their family and lives with their spouse Lives in: House/apartment Stairs: Yes; Internal: 14 steps; on left going up and External: 3 steps; on right going up Has following equipment at home: Single point cane and Walker - 4 wheeled  OCCUPATION: attorney/  mostly retired?  LEISURE: golf (can't do now.), use to play racquetball  HAND DOMINANCE: right   PRIOR LEVEL OF FUNCTION: Independent  POSTURE:significant slump sitting posture with forward head  PATIENT GOALS: decrease pain,get stronger,improve balance   OBJECTIVE:  COGNITION: Overall cognitive status: Within functional limits for tasks assessed   PALPATION: Very tender bilateral UT/levator, scapular regions  OBSERVATIONS / OTHER ASSESSMENTS: very flexed head and neck with chin nearly at chest  SENSATION: Light touch: Appears intact    POSTURE: significant forward head, round shoulders, increased kyphosis   Gait holding single point cane in left hand but not using. Right shoulder low, flexed neck;difficulty raising head to see where he is going  UPPER EXTREMITY AROM/PROM:  A/PROM RIGHT   eval  RIGHT 11/08/2022 RIGHT 12/20/2022  Shoulder extension     Shoulder flexion 91 with compensation 101 with compensation  101 with compensation  Shoulder abduction 67 with compensation 75 with compensation 75 compensation  Shoulder internal rotation     Shoulder external rotation C6      (Blank rows = not tested)  A/PROM LEFT   eval  Shoulder extension   Shoulder flexion 123  Shoulder abduction 112  Shoulder internal rotation T7  Shoulder external rotation t3    (Blank rows = not tested)  CERVICAL AROM: All within normal limits:    Percent limited 11/08/22 12/20/2022  Flexion Fully flexed    Extension From flexed position decreased 80% Decreased 70% Decreased 40%  Right lateral flexion     Left lateral flexion     Right rotation Dec 50% sitting in FH posture  Dec 50% from forward head posture  Left rotation        Dec 50%  Decreased 50% from forward head posture    UPPER EXTREMITY STRENGTH:  LOWER EXTREMITY AROM/PROM:  A/PROM Right eval  Hip flexion   Hip extension   Hip abduction   Hip adduction   Hip internal rotation   Hip external rotation   Knee  flexion   Knee extension   Ankle dorsiflexion   Ankle plantarflexion   Ankle inversion   Ankle eversion    (Blank rows = not tested)  A/PROM LEFT eval  Hip flexion   Hip extension   Hip abduction   Hip adduction   Hip internal rotation   Hip external rotation   Knee flexion   Knee extension   Ankle dorsiflexion   Ankle plantarflexion   Ankle inversion   Ankle eversion    (Blank rows = not tested)  LOWER EXTREMITY MMT:    10/04/2022  4 position balance test; able to hold DLS feet together , and half tandem stance 10 seconds. Unable to maintain full tandem stance for 10 sec, unable to hold SLS greater than a second or 2 on left, and 4 seconds on right.     LYMPHEDEMA ASSESSMENTS:   SURGERY TYPE/DATE: 8/17/23Left prox humerus fixation for pathologic fx  2/2/2024Fusion T1-T4 for pathologic fx with cord compression 06/04/22 posterior cervical fusion/foraminotomy;T1-4 fusion NUMBER OF LYMPH NODES REMOVED: NA  CHEMOTHERAPY: Velcade/promalyst/Dex;  RADIATION:to prox humerus and thoracic spine pathologic fx  HORMONE TREATMENT: NA  INFECTIONS: NA  FUNCTIONAL TESTS:  10/09/2022;30 second sit to stand 10 with heavy use of arms  GAIT: Distance walked: 20 Assistive device utilized: carried SPC in left Level of assistance: Complete Independence Comments: feels unsteady, very flexed neck posture   QUICK DASH SURVEY:    TODAY'S TREATMENT:                                                                                                                                         DATE:   12/27/2022 Nu Step seat 12, UE 8, Lev 4 x 9 min, 810 steps, 02 98, HR 49 after exercising, RPE 3-4 6 in step ups x 10 B Mini squats holding bars x 15 RPE 3 Marching B x 15 with 0 to light HH, O2 96, HR 51 Sidestepping in bars 4 laps Finger ladder x 5 for flexion to 32 Tandem stance B x 2 ea on floor, then x 2 on ax beam Forward walking on beam 6 lengths, light hand touch to no hand  touch Sidestepping on beam x 4 lengths light to no HH Manual Therapy STM seated in chair with cocoa butter to bil UT/upper scapular regions, and cervical and thoracic paraspinals where pt palpably very tight. 8/23/24Pt seen for aquatic therapy today.  Treatment took place in water 3.5-4.75 ft in depth at the [facility] pool. Temp of water was 91.  Pt entered/exited the pool via stairs with bilat rail independently. Pt requires buoyancy of water for support and to offload joints with strengthening exercises.Pt utilizes viscosity  of the water required for strengthening.   Seated water bench with 75% submersion Pt performed seated LE AROM exercises 20x in all planes, with concurrent discussion of his current health status and pain assessment. 75% depth water walking with single buoy UE wts for push/pull 10 lengths. Hip 3 way kicks 15x Bil holding onto wall for balance with VC to push water a bit faster and adding ankle fins for more resistance. Small marching across pool holding single buoy floats under water 4 lengths f/f tandem walking holding floats 4x across pool.  Single leg press with small noodle 2x10 Bil with balance assist. Horizontal float with concurrent skin rolling on upper back and lateral sway for trunk stretching by PTA.  12/20/2022 Nu Step;seat 12, UE 8, Lev 4 x 8 min, 611 steps, O2 96, HR 58,  week. Able to perform sit to stand x 5 without Ue's but very difficult and has trouble getting his weight forward over his feet. Tandem stance right LE forward 18 sec,  Left 14.98 sec, SLS B x 2 to failure Step up on ax forward x 10 ea Checked goals;assessed shoulder and cervical ROM Manual Therapy STM seated in chair with cocoa butter to bil UT/upper scapular regions, and cervical and thoracic paraspinals where pt palpably very tight.  PATIENT EDUCATION:  Education details: sitting posture with lumbar roll Person educated: Patient Education method: Software engineer Education comprehension: verbalized understanding and returned demonstration  HOME EXERCISE PROGRAM: Doing HEP from HHPT; cervical ROM, shoulder rolls, shrugs, scap retraction, UT stretches  Aquatic Access Code: [email or web address] URL: [email or web address] Date: 11/09/2022 Prepared by: [facility] [address] This aquatic home exercise program from Med[employer] utilizes pictures from land based exercises, but has been adapted prior to lamination and issuance.    ASSESSMENT:  CLINICAL IMPRESSION:  Pts legs fatigued after rx but no increase in LBP with exercises today. Pts heart rate ranged from 46-51 today with exercises, but O2 stats remain good. He is improving with balance and was challenged with Ax beam but able to do short periods without use of hands. His RPE also remained Low today. OBJECTIVE IMPAIRMENTS: decreased activity tolerance, decreased balance, difficulty walking, decreased ROM, decreased strength, increased edema, impaired UE functional use, postural dysfunction, and pain.   ACTIVITY LIMITATIONS: carrying, lifting, dressing, reach over head, and locomotion level  PARTICIPATION LIMITATIONS: cleaning, laundry, driving, and occupation  PERSONAL FACTORS:  Multiple Myeloma  are also affecting patient's functional outcome.   REHAB POTENTIAL: Good  CLINICAL DECISION MAKING: Evolving/moderate complexity  EVALUATION COMPLEXITY: Moderate  GOALS: Goals reviewed with patient? Yes  SHORT TERM GOALS: Target date: 10/21/22  Pt will be educated in and independent in proper sitting posture using lumbar roll prn Baseline: Goal status; MET 11/08/22 2.  Pt will report decreased muscle soreness in bilateral upper back x 25% Baseline:  Goal status: ONGOING  3.  Pt will be compliant with a HEP for postural strengthening/ROM Baseline:  Goal status: MET / LONG TERM GOALS: Target date: 12/20/22  Pt will be able to bring his head to neutral  position for safety with ambulation/driving Baseline:  Goal status:Ongoing( but improved) 2.  Pt will have decreased upper back pain by 50% or more Baseline:  Goal status:Ongoing 3.  Pt will be able to flex right shoulder to 115 degrees with minimal compensation for improved reaching ability Baseline:  Goal status: ONGOING 4; Pt will be able to maintain tandem stance on both sides x 10 sec to demonstrated  improved balance. Goal status:MET right foot forward 11/08/2022, MET both legs 12/20/2022  5. Pt will be able to perform sit to stand x 5 without use of Ue's  Baseline:must use hands  Goal status:MET 12/20/2022 but difficult 6.  Pt will be able to maintain SLS on ea leg x 10 seconds without LOB  GOAL: NEW  PT FREQUENCY: 2x/week  PT DURATION: 6 weeks  PLANNED INTERVENTIONS: Therapeutic exercises, Therapeutic activity, Neuromuscular re-education, Balance training, Gait training, Patient/Family education, Self Care, Joint mobilization, Aquatic Therapy, Manual therapy, and Re-evaluation  PLAN FOR NEXT SESSION: Cont land and aquatic exercises working on posture, strength, balance, STM prn for pain  Alvira Monday, PT 12/27/22 12:17 PM  12/27/22 12:17 PM

## 2022-12-28 NOTE — Telephone Encounter (Signed)
Graciella Freer, PA-C     Would continue to monitor HRs;  While >15 lbs overnight is unlikely, he will need BMET this week to check kidney function and potassium.

## 2022-12-29 ENCOUNTER — Encounter: Payer: Self-pay | Admitting: Physical Therapy

## 2022-12-29 ENCOUNTER — Other Ambulatory Visit: Payer: Self-pay

## 2022-12-29 ENCOUNTER — Telehealth: Payer: Self-pay

## 2022-12-29 ENCOUNTER — Inpatient Hospital Stay: Payer: Medicare Other

## 2022-12-29 ENCOUNTER — Inpatient Hospital Stay (HOSPITAL_BASED_OUTPATIENT_CLINIC_OR_DEPARTMENT_OTHER): Payer: Medicare Other | Admitting: Physician Assistant

## 2022-12-29 ENCOUNTER — Encounter: Payer: Self-pay | Admitting: Physician Assistant

## 2022-12-29 ENCOUNTER — Other Ambulatory Visit: Payer: Self-pay | Admitting: *Deleted

## 2022-12-29 ENCOUNTER — Ambulatory Visit: Payer: Medicare Other | Admitting: Physical Therapy

## 2022-12-29 VITALS — BP 129/53 | HR 53 | Temp 98.4°F | Resp 16 | Wt 255.8 lb

## 2022-12-29 VITALS — BP 134/60 | HR 55

## 2022-12-29 DIAGNOSIS — C9000 Multiple myeloma not having achieved remission: Secondary | ICD-10-CM

## 2022-12-29 DIAGNOSIS — G8929 Other chronic pain: Secondary | ICD-10-CM

## 2022-12-29 DIAGNOSIS — Z86718 Personal history of other venous thrombosis and embolism: Secondary | ICD-10-CM | POA: Diagnosis not present

## 2022-12-29 DIAGNOSIS — Z823 Family history of stroke: Secondary | ICD-10-CM | POA: Diagnosis not present

## 2022-12-29 DIAGNOSIS — R278 Other lack of coordination: Secondary | ICD-10-CM

## 2022-12-29 DIAGNOSIS — M6281 Muscle weakness (generalized): Secondary | ICD-10-CM | POA: Diagnosis not present

## 2022-12-29 DIAGNOSIS — I480 Paroxysmal atrial fibrillation: Secondary | ICD-10-CM | POA: Diagnosis not present

## 2022-12-29 DIAGNOSIS — E876 Hypokalemia: Secondary | ICD-10-CM | POA: Diagnosis not present

## 2022-12-29 DIAGNOSIS — I5033 Acute on chronic diastolic (congestive) heart failure: Secondary | ICD-10-CM | POA: Diagnosis not present

## 2022-12-29 DIAGNOSIS — S22000A Wedge compression fracture of unspecified thoracic vertebra, initial encounter for closed fracture: Secondary | ICD-10-CM | POA: Diagnosis not present

## 2022-12-29 DIAGNOSIS — Z79899 Other long term (current) drug therapy: Secondary | ICD-10-CM | POA: Diagnosis not present

## 2022-12-29 DIAGNOSIS — G4733 Obstructive sleep apnea (adult) (pediatric): Secondary | ICD-10-CM | POA: Diagnosis not present

## 2022-12-29 DIAGNOSIS — R1310 Dysphagia, unspecified: Secondary | ICD-10-CM | POA: Diagnosis not present

## 2022-12-29 DIAGNOSIS — Z7961 Long term (current) use of immunomodulator: Secondary | ICD-10-CM | POA: Diagnosis not present

## 2022-12-29 DIAGNOSIS — Z7952 Long term (current) use of systemic steroids: Secondary | ICD-10-CM | POA: Diagnosis not present

## 2022-12-29 DIAGNOSIS — R293 Abnormal posture: Secondary | ICD-10-CM | POA: Diagnosis not present

## 2022-12-29 DIAGNOSIS — M48061 Spinal stenosis, lumbar region without neurogenic claudication: Secondary | ICD-10-CM | POA: Diagnosis not present

## 2022-12-29 DIAGNOSIS — M542 Cervicalgia: Secondary | ICD-10-CM

## 2022-12-29 DIAGNOSIS — Z923 Personal history of irradiation: Secondary | ICD-10-CM | POA: Diagnosis not present

## 2022-12-29 DIAGNOSIS — M7989 Other specified soft tissue disorders: Secondary | ICD-10-CM | POA: Diagnosis not present

## 2022-12-29 DIAGNOSIS — Z8249 Family history of ischemic heart disease and other diseases of the circulatory system: Secondary | ICD-10-CM | POA: Diagnosis not present

## 2022-12-29 DIAGNOSIS — Z881 Allergy status to other antibiotic agents status: Secondary | ICD-10-CM | POA: Diagnosis not present

## 2022-12-29 DIAGNOSIS — M5126 Other intervertebral disc displacement, lumbar region: Secondary | ICD-10-CM | POA: Diagnosis not present

## 2022-12-29 DIAGNOSIS — M533 Sacrococcygeal disorders, not elsewhere classified: Secondary | ICD-10-CM | POA: Diagnosis not present

## 2022-12-29 DIAGNOSIS — I10 Essential (primary) hypertension: Secondary | ICD-10-CM | POA: Diagnosis not present

## 2022-12-29 DIAGNOSIS — Z9089 Acquired absence of other organs: Secondary | ICD-10-CM | POA: Diagnosis not present

## 2022-12-29 DIAGNOSIS — Z7901 Long term (current) use of anticoagulants: Secondary | ICD-10-CM | POA: Diagnosis not present

## 2022-12-29 DIAGNOSIS — M25511 Pain in right shoulder: Secondary | ICD-10-CM | POA: Diagnosis not present

## 2022-12-29 LAB — CBC WITH DIFFERENTIAL (CANCER CENTER ONLY)
Abs Immature Granulocytes: 0.04 10*3/uL (ref 0.00–0.07)
Basophils Absolute: 0 10*3/uL (ref 0.0–0.1)
Basophils Relative: 1 %
Eosinophils Absolute: 0.1 10*3/uL (ref 0.0–0.5)
Eosinophils Relative: 1 %
HCT: 34.5 % — ABNORMAL LOW (ref 39.0–52.0)
Hemoglobin: 11.2 g/dL — ABNORMAL LOW (ref 13.0–17.0)
Immature Granulocytes: 1 %
Lymphocytes Relative: 5 %
Lymphs Abs: 0.3 10*3/uL — ABNORMAL LOW (ref 0.7–4.0)
MCH: 29.9 pg (ref 26.0–34.0)
MCHC: 32.5 g/dL (ref 30.0–36.0)
MCV: 92 fL (ref 80.0–100.0)
Monocytes Absolute: 0.4 10*3/uL (ref 0.1–1.0)
Monocytes Relative: 6 %
Neutro Abs: 5.7 10*3/uL (ref 1.7–7.7)
Neutrophils Relative %: 86 %
Platelet Count: 142 10*3/uL — ABNORMAL LOW (ref 150–400)
RBC: 3.75 MIL/uL — ABNORMAL LOW (ref 4.22–5.81)
RDW: 16.7 % — ABNORMAL HIGH (ref 11.5–15.5)
WBC Count: 6.5 10*3/uL (ref 4.0–10.5)
nRBC: 0 % (ref 0.0–0.2)

## 2022-12-29 LAB — CMP (CANCER CENTER ONLY)
ALT: 21 U/L (ref 0–44)
AST: 15 U/L (ref 15–41)
Albumin: 3.9 g/dL (ref 3.5–5.0)
Alkaline Phosphatase: 97 U/L (ref 38–126)
Anion gap: 9 (ref 5–15)
BUN: 29 mg/dL — ABNORMAL HIGH (ref 8–23)
CO2: 34 mmol/L — ABNORMAL HIGH (ref 22–32)
Calcium: 9.1 mg/dL (ref 8.9–10.3)
Chloride: 100 mmol/L (ref 98–111)
Creatinine: 1.09 mg/dL (ref 0.61–1.24)
GFR, Estimated: >60 mL/min (ref >60)
Glucose, Bld: 126 mg/dL — ABNORMAL HIGH (ref 70–99)
Potassium: 3 mmol/L — ABNORMAL LOW (ref 3.5–5.1)
Sodium: 143 mmol/L (ref 135–145)
Total Bilirubin: 0.3 mg/dL (ref 0.3–1.2)
Total Protein: 6.1 g/dL — ABNORMAL LOW (ref 6.5–8.1)

## 2022-12-29 MED ORDER — POTASSIUM CHLORIDE CRYS ER 20 MEQ PO TBCR: 40.0000 meq | EXTENDED_RELEASE_TABLET | Freq: Three times a day (TID) | ORAL | Status: DC

## 2022-12-29 MED ORDER — POMALIDOMIDE 2 MG PO CAPS
2.0000 mg | ORAL_CAPSULE | Freq: Every day | ORAL | 0 refills | Status: DC
Start: 2022-12-29 — End: 2023-01-17

## 2022-12-29 MED ORDER — BORTEZOMIB CHEMO SQ INJECTION 3.5 MG (2.5MG/ML)
1.3000 mg/m2 | Freq: Once | INTRAMUSCULAR | Status: AC
  Administered 2022-12-29: 3 mg via SUBCUTANEOUS
  Filled 2022-12-29: qty 1.2

## 2022-12-29 NOTE — Telephone Encounter (Signed)
Pt advised per Dr [name] to take KCL 40 meq TID and to have labs rechecked this Friday 12/31/22.

## 2022-12-29 NOTE — Telephone Encounter (Signed)
See phone encounter from 12/29/22.

## 2022-12-29 NOTE — Patient Instructions (Signed)
[facility]  Discharge Instructions: Thank you for choosing [facility] to provide your oncology and hematology care.   If you have a lab appointment with the Cancer Center, please go directly to the Cancer Center and check in at the registration area.   Wear comfortable clothing and clothing appropriate for easy access to any Portacath or PICC line.   We strive to give you quality time with your provider. You may need to reschedule your appointment if you arrive late (15 or more minutes).  Arriving late affects you and other patients whose appointments are after yours.  Also, if you miss three or more appointments without notifying the office, you may be dismissed from the clinic at the provider's discretion.      For prescription refill requests, have your pharmacy contact our office and allow 72 hours for refills to be completed.    Today you received the following chemotherapy and/or immunotherapy agents: Bortezomib        To help prevent nausea and vomiting after your treatment, we encourage you to take your nausea medication as directed.  BELOW ARE SYMPTOMS THAT SHOULD BE REPORTED IMMEDIATELY: *FEVER GREATER THAN 100.4 F (38 C) OR HIGHER *CHILLS OR SWEATING *NAUSEA AND VOMITING THAT IS NOT CONTROLLED WITH YOUR NAUSEA MEDICATION *UNUSUAL SHORTNESS OF BREATH *UNUSUAL BRUISING OR BLEEDING *URINARY PROBLEMS (pain or burning when urinating, or frequent urination) *BOWEL PROBLEMS (unusual diarrhea, constipation, pain near the anus) TENDERNESS IN MOUTH AND THROAT WITH OR WITHOUT PRESENCE OF ULCERS (sore throat, sores in mouth, or a toothache) UNUSUAL RASH, SWELLING OR PAIN  UNUSUAL VAGINAL DISCHARGE OR ITCHING   Items with * indicate a potential emergency and should be followed up as soon as possible or go to the Emergency Department if any problems should occur.  Please show the CHEMOTHERAPY ALERT CARD or IMMUNOTHERAPY ALERT CARD at  check-in to the Emergency Department and triage nurse.  Should you have questions after your visit or need to cancel or reschedule your appointment, please contact [facility]  Dept: [phone]  and follow the prompts.  Office hours are 8:00 a.m. to 4:30 p.m. Monday - Friday. Please note that voicemails left after 4:00 p.m. may not be returned until the following business day.  We are closed weekends and major holidays. You have access to a nurse at all times for urgent questions. Please call the main number to the clinic Dept: [phone] and follow the prompts.   For any non-urgent questions, you may also contact your provider using MyChart. We now offer e-Visits for anyone 3 and older to request care online for non-urgent symptoms. For details visit [email or web address].   Also download the MyChart app! Go to the app store, search "MyChart", open the app, select [facility], and log in with your MyChart username and password.

## 2022-12-29 NOTE — Progress Notes (Unsigned)
[facility] Telephone:([phone]) [phone]   [phone]) [phone]  PROGRESS NOTE  Patient Care Team: Travis Aspen, MD as PCP - General (Internal Medicine) Travis Riffle, MD as PCP - Cardiology (Cardiology) Travis Reichert, MD as PCP - Sleep Medicine (Cardiology) Travis Prude, MD as PCP - Electrophysiology (Cardiology) Travis Soda, MD as Consulting Physician (General Surgery) Travis Rigger, MD as Consulting Physician (Gastroenterology) Travis Reichert, MD as Consulting Physician (Sleep Medicine) Travis Chard, DO as Consulting Physician (Neurology)  Hematological/Oncological History # IgA Kappa Multiple Myeloma 05/05/2020: Bone marrow biopsy showed increased number of plasma cells representing 14% of all cells in the aspirate associated with numerous variably sized clusters in the clot and biopsy sections 04/10/2021: M protein 0.3, IgA kappa specificity. Kappa 580.5, Labda 7.0, ratio 82.93.   12/07/2021: Labs show of 1971.3, lambda 5.5, ratio 358.42 12/10/2021: Transition care to Dr. Leonides [name]. 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  01/26/2022: Cycle 2 Day 1 of Dara/Rev/Dex 02/22/2022: Cycle 3 Day 1 of Dara/Rev/Dex 03/23/2022: Cycle 4 Day 1 of Dara/Rev/Dex 04/20/2022: Cycle 5 Day 1 of Dara/Rev/Dex 05/18/2022: Cycle 6 Day 1 of Dara/Rev/Dex 05/27/2022: MRI cervical/thoracic/lumbar: pathologic fracture at T2 with significant osseous retropulsion and resulting cord compression. Additional smaller lesions within T1 vertebral body, right C7 and T3 articulating facets.  Small Multiple Myeloma lesions scattered at all lumbar levels and in the visible left sacral ala. Largest lumbar tumor is 2.3 cm in the left L1 posterior elements and pedicle. No lumbar extraosseous or epidural tumor extension, or pathologic fracture.Underlying chronic lumbar spine  degeneration, with a new central disc herniation at L3-L4 since 2021 now resulting in mild to moderate degenerative spinal stenosis there. 05/28/2022-06/09/2022: Admitted for T2 pathologic fracture and cord compression. He received urgent radiation therapy for a total of 3.0 Gy. He underwent  thoracic 1 through 4 posterior lateral arthrodesis/fusion and 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  06/23/2022: Cycle 1 Day 1 of Velcade/Pomalyst/Dex 08/04/2022: Cycle 2 Day 1 of Velcade/Pomalyst/Dex. Pomalyst to be decreased to 2mg  PO daily due to cytopenias.  09/01/2022: Cycle 3 Day 1 of Velcade/Pomalyst/Dex. (Missed 2nd dose of cycle due to hospitalization)  10/06/2022: Cycle 4 Day 1 of Velcade/Pomalyst/Dex.  10/27/2022: Cycle 5 Day 1 of Velcade/Pomalyst/Dex.  12/02/2022: Cycle 6 Day 1 of Velcade/Pomalyst/Dex.  12/29/2022:  Cycle 7 Day 1 of Velcade/Pomalyst/Dex.   Interval History:  Travis [name] 72 y.o. male with medical history significant for IgA Kappa Multiple Myeloma who presents for a follow up visit. He was last seen on 8/012024. In the interim, he continued on Velcade/Pomalyst/Dex therapy.  On exam today Mr. Travis [name] reports he had a fall recently and landed in the bushes. He notes the fall was stumbling over his feet. He did have increased low back pain which has improved. He is using a walker more regularly to help with ambulation. He reports he is eating well and has a great appetite. He has been monitor his weight and is compliant with his diuretics. He denies nausea, vomiting or bowel habit changes. Overall, he is willing and able to proceed with treatment at this time. He denies fevers, chills, sweats, shortness of breath, chest pain or cough. He has no other complaints. Rest of the 10 point ROS is below.     MEDICAL HISTORY:  Past Medical History:  Diagnosis Date   A-fib (HCC)  Arthritis    "comes and goes; mostly in hands, occasionally elbow" (04/05/2017)   Complication of  anesthesia    "just wanted to sleep alot  for couple days S/P VARICOCELE OR" (04/05/2017)   DVT (deep venous thrombosis) (HCC)    LLE   Dysrhythmia    PVCs    GERD (gastroesophageal reflux disease)    History of hiatal hernia    History of radiation therapy    Thoracic Spine- 07/13/22-07/23/22-Dr. Antony [name]   Hypertension    Impaired glucose tolerance    Multiple myeloma (HCC)    Obesity (BMI 30-39.9) 07/26/2016   OSA on CPAP 07/26/2016   Mild with AHI 12.5/hr now on CPAP at 14cm H2O   PAF (paroxysmal atrial fibrillation) (HCC)    s/p PVI Ablation in 2018 // Flecainide Rx // Echo 8/21: EF 60-65, no RWMA, mild LVH, normal RV SF, moderate RVE, mild BAE, trivial MR, mild dilation of aortic root (40 mm)   Pneumonia    "may have had walking pneumonia a few years ago" (04/05/2017)   Pre-diabetes    Thrombophlebitis     SURGICAL HISTORY: Past Surgical History:  Procedure Laterality Date   ATRIAL FIBRILLATION ABLATION  04/05/2017   ATRIAL FIBRILLATION ABLATION N/A 04/05/2017   Procedure: ATRIAL FIBRILLATION ABLATION;  Surgeon: Travis Range, MD;  Location: [facility];  Service: Cardiovascular;  Laterality: N/A;   BONE BIOPSY Right 12/17/2021   Procedure: BONE BIOPSY;  Surgeon: Travis Pippin, MD;  Location: [facility];  Service: Orthopedics;  Laterality: Right;   COMPLETE RIGHT HIP REPLACMENT  01/19/2019   EVALUATION UNDER ANESTHESIA WITH HEMORRHOIDECTOMY N/A 02/24/2017   Procedure: EXAM UNDER ANESTHESIA WITH  HEMORRHOIDECTOMY;  Surgeon: Travis Soda, MD;  Location: [facility];  Service: General;  Laterality: N/A;   EXCISIONAL TOTAL HIP ARTHROPLASTY WITH ANTIBIOTIC SPACERS Right 09/25/2020   Procedure: EXCISIONAL TOTAL HIP ARTHROPLASTY WITH ANTIBIOTIC SPACERS;  Surgeon: Travis Romans, MD;  Location: [facility];  Service: Orthopedics;  Laterality: Right;   FLEXIBLE SIGMOIDOSCOPY N/A 04/15/2015   Procedure:  UNSEDATED FLEXIBLE SIGMOIDOSCOPY;  Surgeon: Travis Bumpers, MD;   Location: [facility];  Service: Endoscopy;  Laterality: N/A;   HUMERUS IM NAIL Right 12/17/2021   Procedure: INTRAMEDULLARY (IM) NAIL HUMERAL;  Surgeon: Travis Pippin, MD;  Location:  SURGERY CENTER;  Service: Orthopedics;  Laterality: Right;   KNEE ARTHROSCOPY Left ~ 2016   POSTERIOR CERVICAL FUSION/FORAMINOTOMY N/A 06/04/2022   Procedure: Thoracic one - Thoracic Four Posterior lateral arthrodesis/ fusion and Thoracic two Corpectomy;  Surgeon: [name], MD;  Location: [facility];  Service: Neurosurgery;  Laterality: N/A;   REIMPLANTATION OF TOTAL HIP Right 12/25/2020   Procedure: REIMPLANTATION/REVISION OF RIGHT TOTAL HIP VERSUS REPEAT IRRIGATION AND DEBRIDEMENT;  Surgeon: Travis Romans, MD;  Location: [facility];  Service: Orthopedics;  Laterality: Right;   TESTICLE SURGERY  1988   VARICOCELE   TONSILLECTOMY     VARICOSE VEIN SURGERY Bilateral     SOCIAL HISTORY: Social History   Socioeconomic History   Marital status: Married    Spouse name: Not on file   Number of children: 1   Years of education: law school   Highest education level: Not on file  Occupational History   Occupation: lawyer  Tobacco Use   Smoking status: Never   Smokeless tobacco: Never  Vaping Use   Vaping status: Never Used  Substance and Sexual Activity   Alcohol use: Yes    Alcohol/week: 2.0 standard drinks of  alcohol    Types: 2 Glasses of wine per week    Comment: 2 glasses of wine a week, occ bourbon, beer   Drug use: No   Sexual activity: Not Currently  Other Topics Concern   Not on file  Social History Narrative   Lives in [place] with spouse in a 2 story home.  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 1 daughter.     Education: Social worker school.    Social Determinants of Health   Financial Resource Strain: Not on file  Food Insecurity: No Food Insecurity (09/27/2022)   Hunger Vital Sign    Worried About Running Out of Food in the Last Year: Never true    Ran Out of  Food in the Last Year: Never true  Transportation Needs: No Transportation Needs (09/27/2022)   PRAPARE - Administrator, Civil Service (Medical): No    Lack of Transportation (Non-Medical): No  Physical Activity: Not on file  Stress: Not on file  Social Connections: Unknown (09/15/2021)   Received from [facility]   Social Network    Social Network: Not on file  Intimate Partner Violence: Not At Risk (09/27/2022)   Humiliation, Afraid, Rape, and Kick questionnaire    Fear of Current or Ex-Partner: No    Emotionally Abused: No    Physically Abused: No    Sexually Abused: No    FAMILY HISTORY: Family History  Problem Relation Age of Onset   Dementia Mother    Stroke Father    Atrial fibrillation Father    Hypertension Father    Hypertension Paternal Grandfather    Dementia Maternal Grandmother     ALLERGIES:  is allergic to linezolid and vancomycin.  MEDICATIONS:  Current Outpatient Medications  Medication Sig Dispense Refill   acyclovir (ZOVIRAX) 400 MG tablet Take 1 tablet (400 mg total) by mouth 2 (two) times daily. 180 tablet 3   albuterol (VENTOLIN HFA) 108 (90 Base) MCG/ACT inhaler Inhale 2 puffs into the lungs every 6 (six) hours as needed for wheezing or shortness of breath. 8 g 0   amiodarone (PACERONE) 200 MG tablet Take 1 tablet (200 mg total) by mouth daily. Start after finishing 40 mg twice daily dose of  days (Patient taking differently: Take 200 mg by mouth daily.) 180 tablet 1   calcium carbonate (TUMS - DOSED IN MG ELEMENTAL CALCIUM) 500 MG chewable tablet Chew 1 tablet by mouth daily.     cholecalciferol (VITAMIN D3) 25 MCG (1000 UNIT) tablet Take 1,000 Units by mouth daily.     Cyanocobalamin (VITAMIN B-12 PO) Take 1,000 mcg by mouth daily.     dexamethasone (DECADRON) 4 MG tablet Take 10 tablets (40 mg total) by mouth every 7 (seven) days. Take 40 mg by mouth once a day in the morning of chemo on Wednesdays- or unless otherwise  instructed 40 tablet 2   ELIQUIS 5 MG TABS tablet TAKE ONE TABLET BY MOUTH TWICE A DAY 60 tablet 6   fluticasone (FLONASE) 50 MCG/ACT nasal spray Place 1 spray into both nostrils daily as needed for allergies or rhinitis.     gabapentin (NEURONTIN) 300 MG capsule Take 2 capsules (600 mg total) by mouth 2 (two) times daily. 360 capsule 1   hydrALAZINE (APRESOLINE) 50 MG tablet Take 1.5 tablets (75 mg total) by mouth 3 (three) times daily. 135 tablet 3   irbesartan (AVAPRO) 150 MG tablet Take 1 tablet (150 mg total)  by mouth daily. 30 tablet 5   metolazone (ZAROXOLYN) 2.5 MG tablet Take 1 tablet (2.5 mg total) by mouth every other day. 45 tablet 3   metoprolol succinate (TOPROL-XL) 50 MG 24 hr tablet Take 1/2 to 1 as needed up to twice a day for AFIB and heart rate above 100. 90 tablet 3   Omega-3 Fatty Acids (FISH OIL PO) Take 1 Capful by mouth daily.     polyethylene glycol powder (GLYCOLAX/MIRALAX) 17 GM/SCOOP powder Take 1 Container by mouth daily.     pomalidomide (POMALYST) 2 MG capsule Take 1 capsule (2 mg total) by mouth daily. Take one tablet (2 mg by mouth) daily for 21 days, then take 7 days off [employer] auth # [id number] Date obtained  12/29/22 21 capsule 0   potassium chloride SA (KLOR-CON M) 20 MEQ tablet Take 2 tablets (40 mEq total) by mouth daily. Take an extra tablet on the day you take the Metolazone (60 meq total) (Patient taking differently: Take 40 mEq by mouth 3 (three) times daily. Take an extra tablet on the day you take the Metolazone (60 meq total)) 225 tablet 3   sodium chloride (OCEAN) 0.65 % SOLN nasal spray Place 1 spray into both nostrils as needed for congestion.     TART CHERRY PO Take 1 tablet by mouth at bedtime.     torsemide (DEMADEX) 20 MG tablet Take 2 tablets (40 mg total) by mouth daily. 90 tablet 3   No current facility-administered medications for this visit.    REVIEW OF SYSTEMS:   Constitutional: ( - ) fevers, ( - )  chills , ( - ) night sweats Eyes: ( - )  blurriness of vision, ( - ) double vision, ( - ) watery eyes Ears, nose, mouth, throat, and face: ( - ) mucositis, ( - ) sore throat Respiratory: ( - ) cough, ( - ) dyspnea, ( - ) wheezes Cardiovascular: ( - ) palpitation, ( - ) chest discomfort, ( - ) lower extremity swelling Gastrointestinal:  ( - ) nausea, ( - ) heartburn, ( - ) change in bowel habits Skin: ( - ) abnormal skin rashes Lymphatics: ( - ) new lymphadenopathy, ( - ) easy bruising Neurological: ( - ) numbness, ( - ) tingling, ( - ) new weaknesses Behavioral/Psych: ( - ) mood change, ( - ) new changes  All other systems were reviewed with the patient and are negative.  PHYSICAL EXAMINATION: ECOG PERFORMANCE STATUS: 2 - Symptomatic, <50% confined to bed  Vitals:   12/29/22 1259  BP: (!) 129/53  Pulse: (!) 53  Resp: 16  Temp: 98.4 F (36.9 C)  SpO2: 97%    Filed Weights   12/29/22 1259  Weight: 255 lb 12.8 oz (116 kg)     GENERAL: Well-appearing elderly Caucasian male, alert, no distress and comfortable SKIN: no overt abnormalities of the skin.  EYES: conjunctiva are pink and non-injected, sclera clear LUNGS: clear to auscultation and percussion with normal breathing effort HEART: regular rate &  no murmurs. +bilateral lower extremity edema Musculoskeletal: no cyanosis of digits and no clubbing  PSYCH: alert & oriented x 3, fluent speech NEURO: no focal motor/sensory deficits  LABORATORY DATA:  I have reviewed the data as listed    Latest Ref Rng & Units 12/29/2022   12:31 PM 12/22/2022   12:39 PM 12/15/2022    1:40 PM  CBC  WBC 4.0 - 10.5 K/uL 6.5  12.0  7.9   Hemoglobin 13.0 -  17.0 g/dL 95.6  21.3  08.6   Hematocrit 39.0 - 52.0 % 34.5  36.9  41.7   Platelets 150 - 400 K/uL 142  237  279        Latest Ref Rng & Units 12/22/2022   12:39 PM 12/15/2022    1:40 PM 12/08/2022   12:20 PM  CMP  Glucose 70 - 99 mg/dL 578  469  629   BUN 8 - 23 mg/dL 36  26  22   Creatinine 0.61 - 1.24 mg/dL 5.28  4.13  2.44    Sodium 135 - 145 mmol/L 143  142  144   Potassium 3.5 - 5.1 mmol/L 3.0  3.0  3.7   Chloride 98 - 111 mmol/L 99  96  110   CO2 22 - 32 mmol/L 35  37  27   Calcium 8.9 - 10.3 mg/dL 9.2  9.6  9.0   Total Protein 6.5 - 8.1 g/dL 6.4  6.8  6.1   Total Bilirubin 0.3 - 1.2 mg/dL 0.4  0.5  0.5   Alkaline Phos 38 - 126 U/L 102  101  77   AST 15 - 41 U/L 12  14  16    ALT 0 - 44 U/L 17  21  24      Lab Results  Component Value Date   MPROTEIN Not Observed 12/02/2022   MPROTEIN Not Observed 11/03/2022   MPROTEIN Not Observed 10/27/2022   Lab Results  Component Value Date   KPAFRELGTCHN 94.2 (H) 12/02/2022   KPAFRELGTCHN 44.5 (H) 11/03/2022   KPAFRELGTCHN 41.8 (H) 10/27/2022   LAMBDASER 20.3 12/02/2022   LAMBDASER 13.5 11/03/2022   LAMBDASER 15.2 10/27/2022   KAPLAMBRATIO 4.64 (H) 12/02/2022   KAPLAMBRATIO 3.30 (H) 11/03/2022   KAPLAMBRATIO 2.75 (H) 10/27/2022     RADIOGRAPHIC STUDIES: LONG TERM MONITOR (3-14 DAYS)  Result Date: 12/01/2022 Impression:   Rhythms:   Sinus rhythm and atrial fibrillation (47% burden  Longest episode for 1 day, 10 hours 23 min on 11/15/22)  Heart rates 42 to 158 bpms   Average HR 76 bpm.  Rare PVCs Couple triplets    Triggered events corresponded to Sinus rhythm and atrial fibrillation  None were at extreme heart rates ___________________________________________________________________________ ________  The Maximum Heart Rate recorded was 158 bpm, 07/ 19 19: 41: 59, the Minimum Heart Rate recorded was 42 bpm, 07/ 22 03: 22: 34, and the Average Heart Rate was 76 bpm. * There were 6, 934 VE beats with a burden of < 1 % . There was 1 occurrence of Ventricular Tachycardia with the Fastest episode 158 bpm, 07/ 19 19: 41: 59, and the Longest episode 3 beats, 07/ 19 19: 41: 59. * There were 7, 614 SVE beats with a burden of < 1 % . There were 41 occurrences of Supraventricular Tachycardia with the Fastest episode 129 bpm, 07/ 16 20: 46: 35, and the Longest episode 7 beats,  07/ 18 22: 21: 21. * The study included an Atrial Fibrillation/ Flutter Burden of 47 % with 38 % in RVR and < 1 % in SVR. The longest episode was 1d 10h 29m 50. 4s, 07/ 15 09: 38: 44, and the Fastest episode was 152 bpm, 07/ 12 01: 18: 52. * Other rhythms in this study include: Ventricular Run.    ASSESSMENT & PLAN MASSIMILIANO FORBUSH is a 72 y.o. male with medical history significant for IgA Kappa Multiple Myeloma who presents for a follow up visit.  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  He meets diagnostic criteria by having a serum free light chain ratio greater than 100 and lytic lesions causing pathological fracture of the humerus.  Given this I would recommend we pursued Darzalex, Revlimid, and dexamethasone.  Given his advanced age I would not recommend transplantation, that this is something we can consider when it comes time to transition to maintenance therapy versus transplant.  The patient voices understanding of the plan moving forward.  # IgA Kappa Multiple Myeloma --Metastatic survey completed 12/15/2021 --Patient underwent fixation of his humerus on 12/17/2021, pathology confirmed plasmacytoma/plasma cell neoplasm.  --Received 6 cycles of Darzalex, Revlimid, and dexamethasone started on 12/28/2021-05/18/2022.  --Due to worsening myeloma osseous lesions including pathologic fracture at T2 with cord compression, Dr. Leonides [name] recommends changing therapy to Velcade/Pomalyst/Dex.  Plan: --Due for Cycle 7, Day 1 of Velcade/Pom/Dex today --labs from today showed white blood cell 6.5, hemoglobin 11.2, MCV 92.0, and platelets of 142 --Most recent myeloma labs from 12/02/2022 with SPEP and SFLC showed M protein not detectable. Kappa increased to 94.2 (from 44.5), Lambda 20.3, Ratio 4.64. --Proceed with treatment today without any dose modifications.  --Will repeat myeloma today to closely trend kappa  light chain levels.  --Pomalyst was dose reduced to 2 mg p.o. due to cytopenias. Recommend to continue --RTC in 2 weeks with interval weekly treatments.   #Double vision: --Discussed with Dr. Leonides [name] who recommend to monitor and continue with Velcade therapy.  --Proceed with corrective lenses as prescribed by opthalmologist.   #Neck pain and mid back pain: --Suspect pain is exacerbated from recent hospitalization and being in bed/sitting for long periods.  --MRI C/T/L spine obtinaed that showed unchanged lytic lesions in the right C7 inferior articular process,T6, T8, and T10  vertebral bodies, as well  as the T11 spinous process. Unchanged  multilevel cervical spondylsis, Interval enlargement of disc herniation at L3-L4 with progressive spinal canal stenosis. No new or progressive lytic lesions.  --Encouraged to continue with physical therapy and follow up with ortho.   #Palliative Radiation Therapy: # Dysphagia --Received palliative radiation from 01/06/2022-01/19/2022 to right hymerus, right forearm and right chest/rib, 20 Gy in 10 Fx.  --recieved palliative radiation to surgical bed in the upper thoracic spine area on 07/13/2022.  27 Gy in 9 Fx.   #Lower extremity edema: --Currently on torsemide and metolazone with good control of his edema.  --Continue to follow up with cardiology.   # Rash Concerning for Shingles-resolved -- Prescribed valacyclovir 3 times daily 1000 mg x 10 days-completed the course -- We will continue to monitor rash closely.  #DVT: --Found to have acute DVT involving left peroneal veins. Likely secondary to recent surgery and hospitalization --Currently on eliquis 5 mg BID.    #Hypokalemia: --Potassium level is 3.0 today. Per cardiology, patient will take oral potassium chloride 40 mEq TID and will recheck levels on 12/31/2022.   #Rash on scalp and right forearm: --Recommend topical hydrocortisone cream for spot treatment  #Supportive Care -- chemotherapy  education complete -- port placement not required -- zofran 8mg  q8H PRN and compazine 10mg  PO q6H for nausea -- acyclovir 400mg  PO BID for VCZ prophylaxis -- Started Zometa q 12 weeks on 08/18/2022. Next dose July 2024.   No orders of the defined types were placed in this encounter.  All questions were answered. The patient knows to call the clinic with any problems, questions  or concerns.  A total of more than 30 minutes were spent on this encounter with face-to-face time and non-face-to-face time, including preparing to see the patient, ordering tests and/or medications, counseling the patient and coordination of care as outlined above.   Georga Kaufmann PA-C Dept of Hematology and Oncology [facility] Phone: [phone]     12/29/2022 1:09 PM

## 2022-12-29 NOTE — Telephone Encounter (Signed)
I saw the pts Oncology labs from this morning and his K is still at 3.0...Travis [name]   I verified his meds:   Torsemide 40 mg every day Metolazone 2.5 mg every other day KCL 40 meq BID   This is the second low K at 3.0 pt has had since 12/22/22.   Pt says he has been feeling well and believes his fluid is under well control with the current med regimen.   Will talk with Dr [name].

## 2022-12-29 NOTE — Therapy (Unsigned)
OUTPATIENT PHYSICAL THERAPY ONCOLOGY TREATMENT  Patient Name: Travis [name] MRN: [id number] DOB:Nov 06, 1950, 72 y.o., male Today's Date: 12/30/2022   END OF SESSION:  PT End of Session - 12/29/22 1010     Visit Number 24    Number of Visits 29    Date for PT Re-Evaluation 01/17/23    PT Start Time 1010    PT Stop Time 1100    PT Time Calculation (min) 50 min    Activity Tolerance Patient tolerated treatment well    Behavior During Therapy WFL for tasks assessed/performed                    Past Medical History:  Diagnosis Date   A-fib (HCC)    Arthritis    "comes and goes; mostly in hands, occasionally elbow" (04/05/2017)   Complication of anesthesia    "just wanted to sleep alot  for couple days S/P VARICOCELE OR" (04/05/2017)   DVT (deep venous thrombosis) (HCC)    LLE   Dysrhythmia    PVCs    GERD (gastroesophageal reflux disease)    History of hiatal hernia    History of radiation therapy    Thoracic Spine- 07/13/22-07/23/22-Dr. Antony Blackbird   Hypertension    Impaired glucose tolerance    Multiple myeloma (HCC)    Obesity (BMI 30-39.9) 07/26/2016   OSA on CPAP 07/26/2016   Mild with AHI 12.5/hr now on CPAP at 14cm H2O   PAF (paroxysmal atrial fibrillation) (HCC)    s/p PVI Ablation in 2018 // Flecainide Rx // Echo 8/21: EF 60-65, no RWMA, mild LVH, normal RV SF, moderate RVE, mild BAE, trivial MR, mild dilation of aortic root (40 mm)   Pneumonia    "may have had walking pneumonia a few years ago" (04/05/2017)   Pre-diabetes    Thrombophlebitis    Past Surgical History:  Procedure Laterality Date   ATRIAL FIBRILLATION ABLATION  04/05/2017   ATRIAL FIBRILLATION ABLATION N/A 04/05/2017   Procedure: ATRIAL FIBRILLATION ABLATION;  Surgeon: [name], MD;  Location: [facility];  Service: Cardiovascular;  Laterality: N/A;   BONE BIOPSY Right 12/17/2021   Procedure: BONE BIOPSY;  Surgeon: [name], MD;  Location: [facility];   Service: Orthopedics;  Laterality: Right;   COMPLETE RIGHT HIP REPLACMENT  01/19/2019   EVALUATION UNDER ANESTHESIA WITH HEMORRHOIDECTOMY N/A 02/24/2017   Procedure: EXAM UNDER ANESTHESIA WITH  HEMORRHOIDECTOMY;  Surgeon: [name], MD;  Location: [facility];  Service: General;  Laterality: N/A;   EXCISIONAL TOTAL HIP ARTHROPLASTY WITH ANTIBIOTIC SPACERS Right 09/25/2020   Procedure: EXCISIONAL TOTAL HIP ARTHROPLASTY WITH ANTIBIOTIC SPACERS;  Surgeon: [name], MD;  Location: [facility];  Service: Orthopedics;  Laterality: Right;   FLEXIBLE SIGMOIDOSCOPY N/A 04/15/2015   Procedure:  UNSEDATED FLEXIBLE SIGMOIDOSCOPY;  Surgeon: [name], MD;  Location: [facility];  Service: Endoscopy;  Laterality: N/A;   HUMERUS IM NAIL Right 12/17/2021   Procedure: INTRAMEDULLARY (IM) NAIL HUMERAL;  Surgeon: [name], MD;  Location:  SURGERY CENTER;  Service: Orthopedics;  Laterality: Right;   KNEE ARTHROSCOPY Left ~ 2016   POSTERIOR CERVICAL FUSION/FORAMINOTOMY N/A 06/04/2022   Procedure: Thoracic one - Thoracic Four Posterior lateral arthrodesis/ fusion and Thoracic two Corpectomy;  Surgeon: [name], MD;  Location: [facility];  Service: Neurosurgery;  Laterality: N/A;   REIMPLANTATION OF TOTAL HIP Right 12/25/2020   Procedure: REIMPLANTATION/REVISION OF RIGHT TOTAL HIP VERSUS REPEAT IRRIGATION AND DEBRIDEMENT;  Surgeon: Charlann Boxer,  Molli Hazard, MD;  Location: [facility];  Service: Orthopedics;  Laterality: Right;   TESTICLE SURGERY  1988   VARICOCELE   TONSILLECTOMY     VARICOSE VEIN SURGERY Bilateral    Patient Active Problem List   Diagnosis Date Noted   Pancreatic cyst 09/30/2022   Infection due to novel influenza A virus 09/29/2022   Persistent atrial fibrillation (HCC) 07/15/2022   Acute on chronic diastolic heart failure (HCC) 07/03/2022   Pulmonary infiltrates 07/01/2022   Acute respiratory failure with hypoxia (HCC) 07/01/2022   Hyponatremia 06/29/2022   History of thoracic surgery  06/29/2022   Acute DVT of left tibial vein (HCC) 06/21/2022   Hypokalemia 06/21/2022   Vitamin D deficiency 05/30/2022   Other constipation 05/29/2022   Pathologic compression fracture of spine, initial encounter (HCC) 05/29/2022   Cord compression (HCC) 05/28/2022   Degenerative cervical disc 05/14/2022   Hypercoagulable state due to persistent atrial fibrillation (HCC) 01/11/2022   Multiple myeloma (HCC) 12/10/2021   Medication monitoring encounter 02/27/2021   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   Status post peripherally inserted central catheter (PICC) central line placement 11/06/2020   Drug rash with eosinophilia and systemic symptoms syndrome 11/06/2020   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   PAF (paroxysmal atrial fibrillation) (HCC)    GERD (gastroesophageal reflux disease) 03/29/2018   Hiatal hernia 03/29/2018   History of DVT (deep vein thrombosis) 03/29/2018   CAP (community acquired pneumonia) 03/29/2018   Osteoarthritis 03/29/2018   Neuropathy 08/29/2017   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   Paroxysmal atrial fibrillation with rapid ventricular response (HCC) 04/05/2017   Prolapsed internal hemorrhoids, grade 4, s/p ligation/pexy/hemorrhoidectomy x 2 02/24/2017 02/24/2017   OSA (obstructive sleep apnea) 07/26/2016   Obesity (BMI 30-39.9) 07/26/2016   Atrial fibrillation with RVR (HCC)    Essential hypertension    Chest pain at rest     PCP: Eleanora Neighbor, MD  REFERRING PROVIDER: Antony Blackbird, MD  REFERRING DIAG: Multiple Myeloma  THERAPY DIAG:  Compression fracture of thoracic vertebra, unspecified thoracic vertebral level, initial encounter (HCC)  Multiple myeloma not having achieved remission (HCC)  Muscle weakness (generalized)  Abnormal posture  Neck pain  Other lack of coordination  Chronic right shoulder pain  ONSET DATE: 2023  Rationale for Evaluation and Treatment: Rehabilitation  SUBJECTIVE:                                                                                                                                                                                            SUBJECTIVE STATEMENT: I am frustrated. Frustrated with basically everything, 1 step forward, 3 steps back.  PERTINENT HISTORY:  RENDELL RABB 72 y.o. male with medical history significant for IgA Kappa Multiple Myeloma. He was diagnosed with smouldering myeloma in 2018 . Last July he developed right arm pain due to a pathologic fx and he underwent radiation and fixation. In early Feb 2024 he had radiation for pathologic T2 compression fx followed by a T1-T4 fusion. PAIN:  Are you having pain?  PAIN:  Are you having pain? Yes NPRS scale:  4/10 neck/upper back, 2/10 LB presently Pain location: Left greater than right LB Pain orientation: Left  PAIN TYPE: aching Pain description: constant  Aggravating factors: standing,rising from chair Relieving factors: recliner, lying down,  PRECAUTIONS: Multiple Myeloma with mets to R ribs, R humerus with ORIF for pathological fracture 12/17/21. Lesions have also been found in R rib and also in his skull. Pathologic fx T2 with fusion T1-4 on 06/04/22, Bilateral LE edema (on Lasix), Afib   WEIGHT BEARING RESTRICTIONS: No  FALLS:  Has patient fallen in last 6 months? Yes. Number of falls 1  LIVING ENVIRONMENT: Lives with: lives with their family and lives with their spouse Lives in: House/apartment Stairs: Yes; Internal: 14 steps; on left going up and External: 3 steps; on right going up Has following equipment at home: Single point cane and Walker - 4 wheeled  OCCUPATION: attorney/ mostly retired?  LEISURE: golf (can't do now.), use to play racquetball  HAND DOMINANCE: right   PRIOR LEVEL OF FUNCTION: Independent  POSTURE:significant slump sitting posture with forward head  PATIENT GOALS: decrease pain,get stronger,improve  balance   OBJECTIVE:  COGNITION: Overall cognitive status: Within functional limits for tasks assessed   PALPATION: Very tender bilateral UT/levator, scapular regions  OBSERVATIONS / OTHER ASSESSMENTS: very flexed head and neck with chin nearly at chest  SENSATION: Light touch: Appears intact    POSTURE: significant forward head, round shoulders, increased kyphosis   Gait holding single point cane in left hand but not using. Right shoulder low, flexed neck;difficulty raising head to see where he is going  UPPER EXTREMITY AROM/PROM:  A/PROM RIGHT   eval  RIGHT 11/08/2022 RIGHT 12/20/2022  Shoulder extension     Shoulder flexion 91 with compensation 101 with compensation  101 with compensation  Shoulder abduction 67 with compensation 75 with compensation 75 compensation  Shoulder internal rotation     Shoulder external rotation C6      (Blank rows = not tested)  A/PROM LEFT   eval  Shoulder extension   Shoulder flexion 123  Shoulder abduction 112  Shoulder internal rotation T7  Shoulder external rotation t3    (Blank rows = not tested)  CERVICAL AROM: All within normal limits:    Percent limited 11/08/22 12/20/2022  Flexion Fully flexed    Extension From flexed position decreased 80% Decreased 70% Decreased 40%  Right lateral flexion     Left lateral flexion     Right rotation Dec 50% sitting in FH posture  Dec 50% from forward head posture  Left rotation        Dec 50%  Decreased 50% from forward head posture    UPPER EXTREMITY STRENGTH:    LOWER EXTREMITY AROM/PROM:  A/PROM Right eval  Hip flexion   Hip extension   Hip abduction   Hip adduction   Hip internal rotation   Hip external rotation   Knee flexion   Knee extension   Ankle dorsiflexion   Ankle plantarflexion   Ankle inversion   Ankle eversion    (Blank  rows = not tested)  A/PROM LEFT eval  Hip flexion   Hip extension   Hip abduction   Hip adduction   Hip internal rotation   Hip  external rotation   Knee flexion   Knee extension   Ankle dorsiflexion   Ankle plantarflexion   Ankle inversion   Ankle eversion    (Blank rows = not tested)  LOWER EXTREMITY MMT:    10/04/2022  4 position balance test; able to hold DLS feet together , and half tandem stance 10 seconds. Unable to maintain full tandem stance for 10 sec, unable to hold SLS greater than a second or 2 on left, and 4 seconds on right.     LYMPHEDEMA ASSESSMENTS:   SURGERY TYPE/DATE: 8/17/23Left prox humerus fixation for pathologic fx  2/2/2024Fusion T1-T4 for pathologic fx with cord compression 06/04/22 posterior cervical fusion/foraminotomy;T1-4 fusion NUMBER OF LYMPH NODES REMOVED: NA  CHEMOTHERAPY: Velcade/promalyst/Dex;  RADIATION:to prox humerus and thoracic spine pathologic fx  HORMONE TREATMENT: NA  INFECTIONS: NA  FUNCTIONAL TESTS:  10/09/2022;30 second sit to stand 10 with heavy use of arms  GAIT: Distance walked: 20 Assistive device utilized: carried SPC in left Level of assistance: Complete Independence Comments: feels unsteady, very flexed neck posture   QUICK DASH SURVEY:    TODAY'S TREATMENT:                                                                                                                                         DATE:   12/29/22:Pt seen for aquatic therapy today.  Treatment took place in water 3.5-4.75 ft in depth at the [facility] pool. Temp of water was 91.  Pt entered/exited the pool via stairs with bilat rail independently. Pt requires buoyancy of water for support and to offload joints with strengthening exercises.Pt utilizes viscosity of the water required for strengthening.   Seated water bench with 75% submersion Pt performed seated LE AROM exercises 20x in all planes, with concurrent discussion of his current health status and pain assessment. 75% depth water walking with single buoy UE wts for push/pull 10 lengths. Hip 3 way kicks 15x Bil holding  onto wall for balance with VC to push water a bit faster and adding ankle fins for more resistance. Small marching across pool holding single buoy floats under water 4 lengths f/f tandem walking holding floats 4x across pool.  Single leg press with small noodle 2x10 Bil with balance assist. Horizontal float with concurrent skin rolling on upper back and lateral sway for trunk stretching by PTA.    12/27/2022 Nu Step seat 12, UE 8, Lev 4 x 9 min, 810 steps, 02 98, HR 49 after exercising, RPE 3-4 6 in step ups x 10 B Mini squats holding bars x 15 RPE 3 Marching B x 15 with 0 to light HH, O2 96, HR 51 Sidestepping in bars 4 laps Finger ladder x  5 for flexion to 32 Tandem stance B x 2 ea on floor, then x 2 on ax beam Forward walking on beam 6 lengths, light hand touch to no hand touch Sidestepping on beam x 4 lengths light to no HH Manual Therapy STM seated in chair with cocoa butter to bil UT/upper scapular regions, and cervical and thoracic paraspinals where pt palpably very tight. 8/23/24Pt seen for aquatic therapy today.  Treatment took place in water 3.5-4.75 ft in depth at the [facility] pool. Temp of water was 91.  Pt entered/exited the pool via stairs with bilat rail independently. Pt requires buoyancy of water for support and to offload joints with strengthening exercises.Pt utilizes viscosity of the water required for strengthening.   Seated water bench with 75% submersion Pt performed seated LE AROM exercises 20x in all planes, with concurrent discussion of his current health status and pain assessment. 75% depth water walking with single buoy UE wts for push/pull 10 lengths. Hip 3 way kicks 15x Bil holding onto wall for balance with VC to push water a bit faster and adding ankle fins for more resistance. Small marching across pool holding single buoy floats under water 4 lengths f/f tandem walking holding floats 4x across pool.  Single leg press with small noodle 2x10 Bil with  balance assist. Horizontal float with concurrent skin rolling on upper back and lateral sway for trunk stretching by PTA.  PATIENT EDUCATION:  Education details: sitting posture with lumbar roll Person educated: Patient Education method: Medical illustrator Education comprehension: verbalized understanding and returned demonstration  HOME EXERCISE PROGRAM: Doing HEP from HHPT; cervical ROM, shoulder rolls, shrugs, scap retraction, UT stretches  Aquatic Access Code: [email or web address] URL: [email or web address] Date: 11/09/2022 Prepared by: [facility] - [address] This aquatic home exercise program from Med[employer] utilizes pictures from land based exercises, but has been adapted prior to lamination and issuance.    ASSESSMENT:  CLINICAL IMPRESSION: Due to the nature of pt's soreness after aquatics last week, we kept exercises about the same as there is no physiological reason to progress exercises until pt show better consistency in his response to treatment. It should be noted pt was able to hold his head up for the majority of todays session.  OBJECTIVE IMPAIRMENTS: decreased activity tolerance, decreased balance, difficulty walking, decreased ROM, decreased strength, increased edema, impaired UE functional use, postural dysfunction, and pain.   ACTIVITY LIMITATIONS: carrying, lifting, dressing, reach over head, and locomotion level  PARTICIPATION LIMITATIONS: cleaning, laundry, driving, and occupation  PERSONAL FACTORS:  Multiple Myeloma  are also affecting patient's functional outcome.   REHAB POTENTIAL: Good  CLINICAL DECISION MAKING: Evolving/moderate complexity  EVALUATION COMPLEXITY: Moderate  GOALS: Goals reviewed with patient? Yes  SHORT TERM GOALS: Target date: 10/21/22  Pt will be educated in and independent in proper sitting posture using lumbar roll prn Baseline: Goal status; MET 11/08/22 2.  Pt will report decreased muscle  soreness in bilateral upper back x 25% Baseline:  Goal status: ONGOING  3.  Pt will be compliant with a HEP for postural strengthening/ROM Baseline:  Goal status: MET / LONG TERM GOALS: Target date: 12/20/22  Pt will be able to bring his head to neutral position for safety with ambulation/driving Baseline:  Goal status:Ongoing( but improved) 2.  Pt will have decreased upper back pain by 50% or more Baseline:  Goal status:Ongoing 3.  Pt will be able to flex right shoulder to 115 degrees with minimal compensation for improved reaching ability Baseline:  Goal status: ONGOING 4; Pt will be able to maintain tandem stance on both sides x 10 sec to demonstrated improved balance. Goal status:MET right foot forward 11/08/2022, MET both legs 12/20/2022  5. Pt will be able to perform sit to stand x 5 without use of Ue's  Baseline:must use hands  Goal status:MET 12/20/2022 but difficult 6.  Pt will be able to maintain SLS on ea leg x 10 seconds without LOB  GOAL: NEW  PT FREQUENCY: 2x/week  PT DURATION: 6 weeks  PLANNED INTERVENTIONS: Therapeutic exercises, Therapeutic activity, Neuromuscular re-education, Balance training, Gait training, Patient/Family education, Self Care, Joint mobilization, Aquatic Therapy, Manual therapy, and Re-evaluation  PLAN FOR NEXT SESSION: Cont land and aquatic exercises working on posture, strength, balance, STM prn for pain  Ane Payment, PTA 12/30/22 7:07 AM

## 2022-12-30 ENCOUNTER — Encounter: Payer: Self-pay | Admitting: Hematology and Oncology

## 2022-12-30 ENCOUNTER — Ambulatory Visit (HOSPITAL_COMMUNITY)
Admission: RE | Admit: 2022-12-30 | Discharge: 2022-12-30 | Disposition: A | Discharge status: Home / Self Care | Payer: Medicare Other | Source: Ambulatory Visit | Attending: Hematology and Oncology | Admitting: Hematology and Oncology

## 2022-12-30 ENCOUNTER — Other Ambulatory Visit: Payer: Self-pay | Admitting: Hematology and Oncology

## 2022-12-30 DIAGNOSIS — M47814 Spondylosis without myelopathy or radiculopathy, thoracic region: Secondary | ICD-10-CM | POA: Diagnosis not present

## 2022-12-30 DIAGNOSIS — C9000 Multiple myeloma not having achieved remission: Secondary | ICD-10-CM

## 2022-12-30 DIAGNOSIS — M47816 Spondylosis without myelopathy or radiculopathy, lumbar region: Secondary | ICD-10-CM | POA: Diagnosis not present

## 2022-12-30 DIAGNOSIS — M8440XA Pathological fracture, unspecified site, initial encounter for fracture: Secondary | ICD-10-CM | POA: Diagnosis not present

## 2022-12-30 LAB — KAPPA/LAMBDA LIGHT CHAINS
Kappa free light chain: 97.8 mg/L — ABNORMAL HIGH (ref 3.3–19.4)
Kappa, lambda light chain ratio: 6.15 — ABNORMAL HIGH (ref 0.26–1.65)
Lambda free light chains: 15.9 mg/L (ref 5.7–26.3)

## 2022-12-30 MED ORDER — IOHEXOL 300 MG/ML  SOLN
100.0000 mL | Freq: Once | INTRAMUSCULAR | Status: AC | PRN
  Administered 2022-12-30: 100 mL via INTRAVENOUS

## 2022-12-31 ENCOUNTER — Encounter: Payer: Self-pay | Admitting: Physician Assistant

## 2022-12-31 ENCOUNTER — Ambulatory Visit: Payer: Medicare Other | Attending: Internal Medicine

## 2022-12-31 DIAGNOSIS — I5033 Acute on chronic diastolic (congestive) heart failure: Secondary | ICD-10-CM

## 2022-12-31 DIAGNOSIS — E876 Hypokalemia: Secondary | ICD-10-CM

## 2022-12-31 LAB — BASIC METABOLIC PANEL
BUN/Creatinine Ratio: 37 — ABNORMAL HIGH (ref 10–24)
BUN: 39 mg/dL — ABNORMAL HIGH (ref 8–27)
CO2: 30 mmol/L — ABNORMAL HIGH (ref 20–29)
Calcium: 9 mg/dL (ref 8.6–10.2)
Chloride: 99 mmol/L (ref 96–106)
Creatinine, Ser: 1.05 mg/dL (ref 0.76–1.27)
Glucose: 104 mg/dL — ABNORMAL HIGH (ref 70–99)
Potassium: 3.2 mmol/L — ABNORMAL LOW (ref 3.5–5.2)
Sodium: 142 mmol/L (ref 134–144)
eGFR: 76 mL/min/{1.73_m2} (ref >59)

## 2023-01-03 LAB — MULTIPLE MYELOMA PANEL, SERUM
Albumin SerPl Elph-Mcnc: 3.3 g/dL (ref 2.9–4.4)
Albumin/Glob SerPl: 1.6 (ref 0.7–1.7)
Alpha 1: 0.3 g/dL (ref 0.0–0.4)
Alpha2 Glob SerPl Elph-Mcnc: 0.8 g/dL (ref 0.4–1.0)
B-Globulin SerPl Elph-Mcnc: 0.9 g/dL (ref 0.7–1.3)
Gamma Glob SerPl Elph-Mcnc: 0.2 g/dL — ABNORMAL LOW (ref 0.4–1.8)
Globulin, Total: 2.2 g/dL (ref 2.2–3.9)
IgA: 36 mg/dL — ABNORMAL LOW (ref 61–437)
IgG (Immunoglobin G), Serum: 242 mg/dL — ABNORMAL LOW (ref 603–1613)
IgM (Immunoglobulin M), Srm: 15 mg/dL (ref 15–143)
Total Protein ELP: 5.5 g/dL — ABNORMAL LOW (ref 6.0–8.5)

## 2023-01-03 NOTE — Telephone Encounter (Signed)
Pt had labs drawn this past week for BMET and PRO BNP but PRO BNP was not drawn.. will see if pt can have with is 01/05/23 labs with Oncology.

## 2023-01-04 ENCOUNTER — Other Ambulatory Visit: Payer: Self-pay | Admitting: Physician Assistant

## 2023-01-04 DIAGNOSIS — C9000 Multiple myeloma not having achieved remission: Secondary | ICD-10-CM

## 2023-01-04 DIAGNOSIS — I5033 Acute on chronic diastolic (congestive) heart failure: Secondary | ICD-10-CM

## 2023-01-05 ENCOUNTER — Other Ambulatory Visit: Payer: Self-pay

## 2023-01-05 ENCOUNTER — Inpatient Hospital Stay: Payer: Medicare Other | Attending: Hematology and Oncology

## 2023-01-05 ENCOUNTER — Inpatient Hospital Stay: Payer: Medicare Other

## 2023-01-05 VITALS — BP 135/54 | HR 54 | Temp 98.6°F | Resp 18 | Wt 257.5 lb

## 2023-01-05 DIAGNOSIS — M546 Pain in thoracic spine: Secondary | ICD-10-CM | POA: Diagnosis not present

## 2023-01-05 DIAGNOSIS — I5033 Acute on chronic diastolic (congestive) heart failure: Secondary | ICD-10-CM | POA: Diagnosis not present

## 2023-01-05 DIAGNOSIS — M533 Sacrococcygeal disorders, not elsewhere classified: Secondary | ICD-10-CM | POA: Diagnosis not present

## 2023-01-05 DIAGNOSIS — I480 Paroxysmal atrial fibrillation: Secondary | ICD-10-CM | POA: Diagnosis not present

## 2023-01-05 DIAGNOSIS — M48061 Spinal stenosis, lumbar region without neurogenic claudication: Secondary | ICD-10-CM | POA: Diagnosis not present

## 2023-01-05 DIAGNOSIS — M542 Cervicalgia: Secondary | ICD-10-CM | POA: Diagnosis not present

## 2023-01-05 DIAGNOSIS — I10 Essential (primary) hypertension: Secondary | ICD-10-CM | POA: Insufficient documentation

## 2023-01-05 DIAGNOSIS — Z818 Family history of other mental and behavioral disorders: Secondary | ICD-10-CM | POA: Insufficient documentation

## 2023-01-05 DIAGNOSIS — Z5112 Encounter for antineoplastic immunotherapy: Secondary | ICD-10-CM | POA: Insufficient documentation

## 2023-01-05 DIAGNOSIS — I708 Atherosclerosis of other arteries: Secondary | ICD-10-CM | POA: Diagnosis not present

## 2023-01-05 DIAGNOSIS — I700 Atherosclerosis of aorta: Secondary | ICD-10-CM | POA: Insufficient documentation

## 2023-01-05 DIAGNOSIS — Z79624 Long term (current) use of inhibitors of nucleotide synthesis: Secondary | ICD-10-CM | POA: Diagnosis not present

## 2023-01-05 DIAGNOSIS — Z9089 Acquired absence of other organs: Secondary | ICD-10-CM | POA: Insufficient documentation

## 2023-01-05 DIAGNOSIS — E876 Hypokalemia: Secondary | ICD-10-CM | POA: Insufficient documentation

## 2023-01-05 DIAGNOSIS — I5030 Unspecified diastolic (congestive) heart failure: Secondary | ICD-10-CM | POA: Diagnosis not present

## 2023-01-05 DIAGNOSIS — R600 Localized edema: Secondary | ICD-10-CM | POA: Diagnosis not present

## 2023-01-05 DIAGNOSIS — R202 Paresthesia of skin: Secondary | ICD-10-CM | POA: Diagnosis not present

## 2023-01-05 DIAGNOSIS — Z7952 Long term (current) use of systemic steroids: Secondary | ICD-10-CM | POA: Diagnosis not present

## 2023-01-05 DIAGNOSIS — Z7961 Long term (current) use of immunomodulator: Secondary | ICD-10-CM | POA: Diagnosis not present

## 2023-01-05 DIAGNOSIS — M25519 Pain in unspecified shoulder: Secondary | ICD-10-CM | POA: Insufficient documentation

## 2023-01-05 DIAGNOSIS — G4733 Obstructive sleep apnea (adult) (pediatric): Secondary | ICD-10-CM | POA: Insufficient documentation

## 2023-01-05 DIAGNOSIS — Z881 Allergy status to other antibiotic agents status: Secondary | ICD-10-CM | POA: Insufficient documentation

## 2023-01-05 DIAGNOSIS — R1310 Dysphagia, unspecified: Secondary | ICD-10-CM | POA: Insufficient documentation

## 2023-01-05 DIAGNOSIS — R21 Rash and other nonspecific skin eruption: Secondary | ICD-10-CM | POA: Insufficient documentation

## 2023-01-05 DIAGNOSIS — M5126 Other intervertebral disc displacement, lumbar region: Secondary | ICD-10-CM | POA: Diagnosis not present

## 2023-01-05 DIAGNOSIS — Z7901 Long term (current) use of anticoagulants: Secondary | ICD-10-CM | POA: Diagnosis not present

## 2023-01-05 DIAGNOSIS — C9000 Multiple myeloma not having achieved remission: Secondary | ICD-10-CM

## 2023-01-05 DIAGNOSIS — Z823 Family history of stroke: Secondary | ICD-10-CM | POA: Insufficient documentation

## 2023-01-05 DIAGNOSIS — Z923 Personal history of irradiation: Secondary | ICD-10-CM | POA: Insufficient documentation

## 2023-01-05 DIAGNOSIS — Z8249 Family history of ischemic heart disease and other diseases of the circulatory system: Secondary | ICD-10-CM | POA: Insufficient documentation

## 2023-01-05 DIAGNOSIS — Z79899 Other long term (current) drug therapy: Secondary | ICD-10-CM | POA: Insufficient documentation

## 2023-01-05 DIAGNOSIS — Z86718 Personal history of other venous thrombosis and embolism: Secondary | ICD-10-CM | POA: Insufficient documentation

## 2023-01-05 LAB — CMP (CANCER CENTER ONLY)
ALT: 18 U/L (ref 0–44)
AST: 13 U/L — ABNORMAL LOW (ref 15–41)
Albumin: 4 g/dL (ref 3.5–5.0)
Alkaline Phosphatase: 91 U/L (ref 38–126)
Anion gap: 11 (ref 5–15)
BUN: 29 mg/dL — ABNORMAL HIGH (ref 8–23)
CO2: 30 mmol/L (ref 22–32)
Calcium: 8.9 mg/dL (ref 8.9–10.3)
Chloride: 101 mmol/L (ref 98–111)
Creatinine: 1.06 mg/dL (ref 0.61–1.24)
GFR, Estimated: >60 mL/min (ref >60)
Glucose, Bld: 142 mg/dL — ABNORMAL HIGH (ref 70–99)
Potassium: 3.2 mmol/L — ABNORMAL LOW (ref 3.5–5.1)
Sodium: 142 mmol/L (ref 135–145)
Total Bilirubin: 0.4 mg/dL (ref 0.3–1.2)
Total Protein: 6.3 g/dL — ABNORMAL LOW (ref 6.5–8.1)

## 2023-01-05 LAB — CBC WITH DIFFERENTIAL (CANCER CENTER ONLY)
Abs Immature Granulocytes: 0.05 10*3/uL (ref 0.00–0.07)
Basophils Absolute: 0.1 10*3/uL (ref 0.0–0.1)
Basophils Relative: 1 %
Eosinophils Absolute: 0.1 10*3/uL (ref 0.0–0.5)
Eosinophils Relative: 1 %
HCT: 36.2 % — ABNORMAL LOW (ref 39.0–52.0)
Hemoglobin: 11.8 g/dL — ABNORMAL LOW (ref 13.0–17.0)
Immature Granulocytes: 1 %
Lymphocytes Relative: 3 %
Lymphs Abs: 0.3 10*3/uL — ABNORMAL LOW (ref 0.7–4.0)
MCH: 30.1 pg (ref 26.0–34.0)
MCHC: 32.6 g/dL (ref 30.0–36.0)
MCV: 92.3 fL (ref 80.0–100.0)
Monocytes Absolute: 0.6 10*3/uL (ref 0.1–1.0)
Monocytes Relative: 6 %
Neutro Abs: 9 10*3/uL — ABNORMAL HIGH (ref 1.7–7.7)
Neutrophils Relative %: 88 %
Platelet Count: 192 10*3/uL (ref 150–400)
RBC: 3.92 MIL/uL — ABNORMAL LOW (ref 4.22–5.81)
RDW: 17.3 % — ABNORMAL HIGH (ref 11.5–15.5)
WBC Count: 10.1 10*3/uL (ref 4.0–10.5)
nRBC: 0 % (ref 0.0–0.2)

## 2023-01-05 MED ORDER — POTASSIUM CHLORIDE CRYS ER 20 MEQ PO TBCR: 40.0000 meq | EXTENDED_RELEASE_TABLET | Freq: Three times a day (TID) | ORAL | Status: DC

## 2023-01-05 MED ORDER — BORTEZOMIB CHEMO SQ INJECTION 3.5 MG (2.5MG/ML)
1.3000 mg/m2 | Freq: Once | INTRAMUSCULAR | Status: AC
  Administered 2023-01-05: 3 mg via SUBCUTANEOUS
  Filled 2023-01-05: qty 1.2

## 2023-01-06 ENCOUNTER — Encounter: Payer: Self-pay | Admitting: *Deleted

## 2023-01-06 ENCOUNTER — Encounter: Payer: Self-pay | Admitting: Internal Medicine

## 2023-01-06 DIAGNOSIS — H2513 Age-related nuclear cataract, bilateral: Secondary | ICD-10-CM | POA: Diagnosis not present

## 2023-01-06 DIAGNOSIS — H532 Diplopia: Secondary | ICD-10-CM | POA: Diagnosis not present

## 2023-01-06 NOTE — Telephone Encounter (Signed)
From our Pharm D:   Possible side effects of carfilzomib (Kyprolis). There are no listed drug-drug interactions  Cardiovascular: Hypertension (11% to 37% ), Peripheral edema (7.6% to 20% )Cardiac arrest (Combination therapy, less than 10%; monotherapy, less than 20% ), Congestive heart failure (Combination therapy, less than 7% to 11%; monotherapy, less than 20% ), Heart failure (Combination therapy, less than 15%; monotherapy, less than 20% ), Myocardial infarction (Combination therapy, less than 15%; monotherapy, less than 20% ), Myocardial ischemia (Combination therapy, less than 15%; monotherapy, less than 20% ), Restrictive cardiomyopathy, Venous thrombosis (Monotherapy, 2%; combination therapy, 9% to 13% )

## 2023-01-07 ENCOUNTER — Ambulatory Visit: Payer: Medicare Other | Attending: Radiation Oncology

## 2023-01-07 DIAGNOSIS — C9000 Multiple myeloma not having achieved remission: Secondary | ICD-10-CM | POA: Insufficient documentation

## 2023-01-07 DIAGNOSIS — R278 Other lack of coordination: Secondary | ICD-10-CM | POA: Diagnosis not present

## 2023-01-07 DIAGNOSIS — R293 Abnormal posture: Secondary | ICD-10-CM | POA: Diagnosis not present

## 2023-01-07 DIAGNOSIS — M6281 Muscle weakness (generalized): Secondary | ICD-10-CM | POA: Diagnosis not present

## 2023-01-07 DIAGNOSIS — M25511 Pain in right shoulder: Secondary | ICD-10-CM | POA: Diagnosis not present

## 2023-01-07 DIAGNOSIS — G8929 Other chronic pain: Secondary | ICD-10-CM | POA: Insufficient documentation

## 2023-01-07 DIAGNOSIS — M542 Cervicalgia: Secondary | ICD-10-CM | POA: Diagnosis not present

## 2023-01-07 DIAGNOSIS — S22000A Wedge compression fracture of unspecified thoracic vertebra, initial encounter for closed fracture: Secondary | ICD-10-CM | POA: Insufficient documentation

## 2023-01-07 NOTE — Therapy (Addendum)
OUTPATIENT PHYSICAL THERAPY ONCOLOGY TREATMENT  Patient Name: Travis [name] MRN: [id number] DOB:06/22/1950, 72 y.o., male Today's Date: 01/07/2023   END OF SESSION:  PT End of Session - 01/07/23 1018     Visit Number 25    Number of Visits 37    Date for PT Re-Evaluation 02/18/23    PT Start Time 1012    PT Stop Time 1057    PT Time Calculation (min) 45 min    Equipment Utilized During Treatment Gait belt    Activity Tolerance Patient tolerated treatment well    Behavior During Therapy WFL for tasks assessed/performed                     Past Medical History:  Diagnosis Date   A-fib (HCC)    Arthritis    "comes and goes; mostly in hands, occasionally elbow" (04/05/2017)   Complication of anesthesia    "just wanted to sleep alot  for couple days S/P VARICOCELE OR" (04/05/2017)   DVT (deep venous thrombosis) (HCC)    LLE   Dysrhythmia    PVCs    GERD (gastroesophageal reflux disease)    History of hiatal hernia    History of radiation therapy    Thoracic Spine- 07/13/22-07/23/22-Dr. Antony Blackbird   Hypertension    Impaired glucose tolerance    Multiple myeloma (HCC)    Obesity (BMI 30-39.9) 07/26/2016   OSA on CPAP 07/26/2016   Mild with AHI 12.5/hr now on CPAP at 14cm H2O   PAF (paroxysmal atrial fibrillation) (HCC)    s/p PVI Ablation in 2018 // Flecainide Rx // Echo 8/21: EF 60-65, no RWMA, mild LVH, normal RV SF, moderate RVE, mild BAE, trivial MR, mild dilation of aortic root (40 mm)   Pneumonia    "may have had walking pneumonia a few years ago" (04/05/2017)   Pre-diabetes    Thrombophlebitis    Past Surgical History:  Procedure Laterality Date   ATRIAL FIBRILLATION ABLATION  04/05/2017   ATRIAL FIBRILLATION ABLATION N/A 04/05/2017   Procedure: ATRIAL FIBRILLATION ABLATION;  Surgeon: [name], MD;  Location: [facility];  Service: Cardiovascular;  Laterality: N/A;   BONE BIOPSY Right 12/17/2021   Procedure: BONE BIOPSY;  Surgeon:  [name], MD;  Location: [facility];  Service: Orthopedics;  Laterality: Right;   COMPLETE RIGHT HIP REPLACMENT  01/19/2019   EVALUATION UNDER ANESTHESIA WITH HEMORRHOIDECTOMY N/A 02/24/2017   Procedure: EXAM UNDER ANESTHESIA WITH  HEMORRHOIDECTOMY;  Surgeon: [name], MD;  Location: [facility];  Service: General;  Laterality: N/A;   EXCISIONAL TOTAL HIP ARTHROPLASTY WITH ANTIBIOTIC SPACERS Right 09/25/2020   Procedure: EXCISIONAL TOTAL HIP ARTHROPLASTY WITH ANTIBIOTIC SPACERS;  Surgeon: [name], MD;  Location: [facility];  Service: Orthopedics;  Laterality: Right;   FLEXIBLE SIGMOIDOSCOPY N/A 04/15/2015   Procedure:  UNSEDATED FLEXIBLE SIGMOIDOSCOPY;  Surgeon: [name], MD;  Location: [facility];  Service: Endoscopy;  Laterality: N/A;   HUMERUS IM NAIL Right 12/17/2021   Procedure: INTRAMEDULLARY (IM) NAIL HUMERAL;  Surgeon: [name], MD;  Location: [facility];  Service: Orthopedics;  Laterality: Right;   KNEE ARTHROSCOPY Left ~ 2016   POSTERIOR CERVICAL FUSION/FORAMINOTOMY N/A 06/04/2022   Procedure: Thoracic one - Thoracic Four Posterior lateral arthrodesis/ fusion and Thoracic two Corpectomy;  Surgeon: [name], MD;  Location: [facility];  Service: Neurosurgery;  Laterality: N/A;   REIMPLANTATION OF TOTAL HIP Right 12/25/2020   Procedure: REIMPLANTATION/REVISION OF RIGHT  TOTAL HIP VERSUS REPEAT IRRIGATION AND DEBRIDEMENT;  Surgeon: [name], MD;  Location: [facility];  Service: Orthopedics;  Laterality: Right;   TESTICLE SURGERY  1988   VARICOCELE   TONSILLECTOMY     VARICOSE VEIN SURGERY Bilateral    Patient Active Problem List   Diagnosis Date Noted   Pancreatic cyst 09/30/2022   Infection due to novel influenza A virus 09/29/2022   Persistent atrial fibrillation (HCC) 07/15/2022   Acute on chronic diastolic heart failure (HCC) 07/03/2022   Pulmonary infiltrates 07/01/2022   Acute respiratory failure with hypoxia (HCC) 07/01/2022    Hyponatremia 06/29/2022   History of thoracic surgery 06/29/2022   Acute DVT of left tibial vein (HCC) 06/21/2022   Hypokalemia 06/21/2022   Vitamin D deficiency 05/30/2022   Other constipation 05/29/2022   Pathologic compression fracture of spine, initial encounter (HCC) 05/29/2022   Cord compression (HCC) 05/28/2022   Degenerative cervical disc 05/14/2022   Hypercoagulable state due to persistent atrial fibrillation (HCC) 01/11/2022   Multiple myeloma (HCC) 12/10/2021   Medication monitoring encounter 02/27/2021   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   Status post peripherally inserted central catheter (PICC) central line placement 11/06/2020   Drug rash with eosinophilia and systemic symptoms syndrome 11/06/2020   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   PAF (paroxysmal atrial fibrillation) (HCC)    GERD (gastroesophageal reflux disease) 03/29/2018   Hiatal hernia 03/29/2018   History of DVT (deep vein thrombosis) 03/29/2018   CAP (community acquired pneumonia) 03/29/2018   Osteoarthritis 03/29/2018   Neuropathy 08/29/2017   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   Paroxysmal atrial fibrillation with rapid ventricular response (HCC) 04/05/2017   Prolapsed internal hemorrhoids, grade 4, s/p ligation/pexy/hemorrhoidectomy x 2 02/24/2017 02/24/2017   OSA (obstructive sleep apnea) 07/26/2016   Obesity (BMI 30-39.9) 07/26/2016   Atrial fibrillation with RVR (HCC)    Essential hypertension    Chest pain at rest     PCP: Eleanora Neighbor, MD  REFERRING PROVIDER: Antony Blackbird, MD  REFERRING DIAG: Multiple Myeloma  THERAPY DIAG:  Compression fracture of thoracic vertebra, unspecified thoracic vertebral level, initial encounter (HCC)  Multiple myeloma not having achieved remission (HCC)  Muscle weakness (generalized)  Abnormal posture  Neck pain  Other lack of coordination  Chronic right shoulder pain  ONSET DATE: 2023  Rationale for  Evaluation and Treatment: Rehabilitation  SUBJECTIVE:                                                                                                                                                                                           SUBJECTIVE STATEMENT:  They start  me next week on a new chemo drug. I got my CT scans back from last week. No new fractures from the fall. I wore my suspenders to keep my pants from falling. I did well after the last pool session without increased soreness.  My weight is up 8Lbs, and I am still on the same meds.  PERTINENT HISTORY:  Travis [name] 72 y.o. male with medical history significant for IgA Kappa Multiple Myeloma. He was diagnosed with smouldering myeloma in 2018 . Last July he developed right arm pain due to a pathologic fx and he underwent radiation and fixation. In early Feb 2024 he had radiation for pathologic T2 compression fx followed by a T1-T4 fusion. PAIN:  Are you having pain?  PAIN:  Are you having pain? Yes NPRS scale:  3/10 neck/upper back, 0/10 LB presently Pain location: Left greater than right LB Pain orientation: Left  PAIN TYPE: aching Pain description: constant  Aggravating factors: standing,rising from chair Relieving factors: recliner, lying down,  PRECAUTIONS: Multiple Myeloma with mets to R ribs, R humerus with ORIF for pathological fracture 12/17/21. Lesions have also been found in R rib and also in his skull. Pathologic fx T2 with fusion T1-4 on 06/04/22, Bilateral LE edema (on Lasix), Afib   WEIGHT BEARING RESTRICTIONS: No  FALLS:  Has patient fallen in last 6 months? Yes. Number of falls 1  LIVING ENVIRONMENT: Lives with: lives with their family and lives with their spouse Lives in: House/apartment Stairs: Yes; Internal: 14 steps; on left going up and External: 3 steps; on right going up Has following equipment at home: Single point cane and Walker - 4 wheeled  OCCUPATION: attorney/ mostly retired?  LEISURE:  golf (can't do now.), use to play racquetball  HAND DOMINANCE: right   PRIOR LEVEL OF FUNCTION: Independent  POSTURE:significant slump sitting posture with forward head  PATIENT GOALS: decrease pain,get stronger,improve balance   OBJECTIVE:  COGNITION: Overall cognitive status: Within functional limits for tasks assessed   PALPATION: Very tender bilateral UT/levator, scapular regions  OBSERVATIONS / OTHER ASSESSMENTS: very flexed head and neck with chin nearly at chest  SENSATION: Light touch: Appears intact    POSTURE: significant forward head, round shoulders, increased kyphosis   Gait holding single point cane in left hand but not using. Right shoulder low, flexed neck;difficulty raising head to see where he is going  UPPER EXTREMITY AROM/PROM:  A/PROM RIGHT   eval  RIGHT 11/08/2022 RIGHT 12/20/2022  Shoulder extension     Shoulder flexion 91 with compensation 101 with compensation  101 with compensation  Shoulder abduction 67 with compensation 75 with compensation 75 compensation  Shoulder internal rotation     Shoulder external rotation C6      (Blank rows = not tested)  A/PROM LEFT   eval  Shoulder extension   Shoulder flexion 123  Shoulder abduction 112  Shoulder internal rotation T7  Shoulder external rotation t3    (Blank rows = not tested)  CERVICAL AROM: All within normal limits:    Percent limited 11/08/22 12/20/2022  Flexion Fully flexed    Extension From flexed position decreased 80% Decreased 70% Decreased 40%  Right lateral flexion     Left lateral flexion     Right rotation Dec 50% sitting in FH posture  Dec 50% from forward head posture  Left rotation        Dec 50%  Decreased 50% from forward head posture    UPPER EXTREMITY STRENGTH:    LOWER  EXTREMITY AROM/PROM:  A/PROM Right eval  Hip flexion   Hip extension   Hip abduction   Hip adduction   Hip internal rotation   Hip external rotation   Knee flexion   Knee extension    Ankle dorsiflexion   Ankle plantarflexion   Ankle inversion   Ankle eversion    (Blank rows = not tested)  A/PROM LEFT eval  Hip flexion   Hip extension   Hip abduction   Hip adduction   Hip internal rotation   Hip external rotation   Knee flexion   Knee extension   Ankle dorsiflexion   Ankle plantarflexion   Ankle inversion   Ankle eversion    (Blank rows = not tested)  LOWER EXTREMITY MMT:    10/04/2022  4 position balance test; able to hold DLS feet together , and half tandem stance 10 seconds. Unable to maintain full tandem stance for 10 sec, unable to hold SLS greater than a second or 2 on left, and 4 seconds on right.     LYMPHEDEMA ASSESSMENTS:   SURGERY TYPE/DATE: 8/17/23Left prox humerus fixation for pathologic fx  2/2/2024Fusion T1-T4 for pathologic fx with cord compression 06/04/22 posterior cervical fusion/foraminotomy;T1-4 fusion NUMBER OF LYMPH NODES REMOVED: NA  CHEMOTHERAPY: Velcade/promalyst/Dex;  RADIATION:to prox humerus and thoracic spine pathologic fx  HORMONE TREATMENT: NA  INFECTIONS: NA  FUNCTIONAL TESTS:  10/09/2022;30 second sit to stand 10 with heavy use of arms  GAIT: Distance walked: 20 Assistive device utilized: carried SPC in left Level of assistance: Complete Independence Comments: feels unsteady, very flexed neck posture   QUICK DASH SURVEY:    TODAY'S TREATMENT:                                                                                                                                         DATE:  01/07/2023 Nu Step seat 12, UE 8, Lev 4 x 9 min, 661 steps, O2 98, HR 61 immediately after 6 in step up x10 B, then x 5 B light HH Minisquats with HH x 10 95%, HR 67 Ax beam 6 lengths forward and 6 sidestepping with light intermittent hand touch Incline stretch for calfs x 3 Marching no hands x 20 Heel raises no hands; 2 LOB requiring hands Tandem balance to failure 3 times each SLS to failure 4 times each STM seated in  chair with cocoa butter to bil UT/upper scapular regions, and cervical and thoracic paraspinals where pt palpably very tight.   12/29/22:Pt seen for aquatic therapy today.  Treatment took place in water 3.5-4.75 ft in depth at the [facility] pool. Temp of water was 91.  Pt entered/exited the pool via stairs with bilat rail independently. Pt requires buoyancy of water for support and to offload joints with strengthening exercises.Pt utilizes viscosity of the water required for strengthening.   Seated water bench with 75% submersion Pt performed seated  LE AROM exercises 20x in all planes, with concurrent discussion of his current health status and pain assessment. 75% depth water walking with single buoy UE wts for push/pull 10 lengths. Hip 3 way kicks 15x Bil holding onto wall for balance with VC to push water a bit faster and adding ankle fins for more resistance. Small marching across pool holding single buoy floats under water 4 lengths f/f tandem walking holding floats 4x across pool.  Single leg press with small noodle 2x10 Bil with balance assist. Horizontal float with concurrent skin rolling on upper back and lateral sway for trunk stretching by PTA.    12/27/2022 Nu Step seat 12, UE 8, Lev 4 x 9 min, 810 steps, 02 98, HR 49 after exercising, RPE 3-4 6 in step ups x 10 B Mini squats holding bars x 15 RPE 3 Marching B x 15 with 0 to light HH, O2 96, HR 51 Sidestepping in bars 4 laps Finger ladder x 5 for flexion to 32 Tandem stance B x 2 ea on floor, then x 2 on ax beam Forward walking on beam 6 lengths, light hand touch to no hand touch Sidestepping on beam x 4 lengths light to no HH Manual Therapy STM seated in chair with cocoa butter to bil UT/upper scapular regions, and cervical and thoracic paraspinals where pt palpably very tight. 8/23/24Pt seen for aquatic therapy today.  Treatment took place in water 3.5-4.75 ft in depth at the [facility] pool. Temp of water was  91.  Pt entered/exited the pool via stairs with bilat rail independently. Pt requires buoyancy of water for support and to offload joints with strengthening exercises.Pt utilizes viscosity of the water required for strengthening.   Seated water bench with 75% submersion Pt performed seated LE AROM exercises 20x in all planes, with concurrent discussion of his current health status and pain assessment. 75% depth water walking with single buoy UE wts for push/pull 10 lengths. Hip 3 way kicks 15x Bil holding onto wall for balance with VC to push water a bit faster and adding ankle fins for more resistance. Small marching across pool holding single buoy floats under water 4 lengths f/f tandem walking holding floats 4x across pool.  Single leg press with small noodle 2x10 Bil with balance assist. Horizontal float with concurrent skin rolling on upper back and lateral sway for trunk stretching by PTA.  PATIENT EDUCATION:  Education details: sitting posture with lumbar roll Person educated: Patient Education method: Medical illustrator Education comprehension: verbalized understanding and returned demonstration  HOME EXERCISE PROGRAM: Doing HEP from HHPT; cervical ROM, shoulder rolls, shrugs, scap retraction, UT stretches  Aquatic Access Code: [email or web address] URL: [email or web address] Date: 11/09/2022 Prepared by: [facility] - [address] This aquatic home exercise program from Med[employer] utilizes pictures from land based exercises, but has been adapted prior to lamination and issuance.    ASSESSMENT:  CLINICAL IMPRESSION: Pt continues to work  hard in therapy . He has made progress with being able to maintain his head more erect but still has difficulty maintaining this for long and he is not yet to eyes level posture. His pain levels are improved today. He continues to have difficulty with balance. He is able to maintain tandem balance quite well, however, SLS is  far more challenging and he had difficulty with balance at the end of the session when he was more fatigued. Pts HR had a more normal response to exercise today and was elevating as it  should with increased activity.  O2 sats did well and recovered quickly. He will benefit from continued skilled therapy to address deficits and return to PLOF OBJECTIVE IMPAIRMENTS: decreased activity tolerance, decreased balance, difficulty walking, decreased ROM, decreased strength, increased edema, impaired UE functional use, postural dysfunction, and pain.   ACTIVITY LIMITATIONS: carrying, lifting, dressing, reach over head, and locomotion level  PARTICIPATION LIMITATIONS: cleaning, laundry, driving, and occupation  PERSONAL FACTORS:  Multiple Myeloma  are also affecting patient's functional outcome.   REHAB POTENTIAL: Good  CLINICAL DECISION MAKING: Evolving/moderate complexity  EVALUATION COMPLEXITY: Moderate  GOALS: Goals reviewed with patient? Yes  SHORT TERM GOALS: Target date: 10/21/22  Pt will be educated in and independent in proper sitting posture using lumbar roll prn Baseline: Goal status; MET 11/08/22 2.  Pt will report decreased muscle soreness in bilateral upper back x 25% Baseline:  Goal status: ONGOING  3.  Pt will be compliant with a HEP for postural strengthening/ROM Baseline:  Goal status: MET / LONG TERM GOALS: Target date: 12/20/22  Pt will be able to bring his head to neutral position for safety with ambulation/driving Baseline:  Goal status:Ongoing( but improved) 2.  Pt will have decreased upper back pain by 50% or more Baseline:  Goal status:Ongoing 3.  Pt will be able to flex right shoulder to 115 degrees with minimal compensation for improved reaching ability Baseline:  Goal status: ONGOING 4; Pt will be able to maintain tandem stance on both sides x 10 sec to demonstrated improved balance. Goal status:MET right foot forward 11/08/2022, MET both legs 12/20/2022  5.  Pt will be able to perform sit to stand x 5 without use of Ue's  Baseline:must use hands  Goal status:MET 12/20/2022 but difficult 6.  Pt will be able to maintain SLS on ea leg x 10 seconds without LOB  GOAL: ONGOING  PT FREQUENCY: 2x/week  PT DURATION: 6 weeks  PLANNED INTERVENTIONS: Therapeutic exercises, Therapeutic activity, Neuromuscular re-education, Balance training, Gait training, Patient/Family education, Self Care, Joint mobilization, Aquatic Therapy, Manual therapy, and Re-evaluation  PLAN FOR NEXT SESSION: Cont land and aquatic exercises working on posture, strength, balance, STM prn for pain  Alvira Monday, PT 01/07/23 12:20 PM

## 2023-01-07 NOTE — Addendum Note (Signed)
Addended by: Waynette Buttery on: 01/07/2023 12:22 PM   Modules accepted: Orders

## 2023-01-09 NOTE — Telephone Encounter (Signed)
His oncologist is looking for best success with the chemotherapy We can follow with frequent echoes to look for changes

## 2023-01-10 ENCOUNTER — Encounter: Payer: Self-pay | Admitting: Hematology and Oncology

## 2023-01-10 ENCOUNTER — Ambulatory Visit: Payer: Medicare Other

## 2023-01-10 DIAGNOSIS — S22000A Wedge compression fracture of unspecified thoracic vertebra, initial encounter for closed fracture: Secondary | ICD-10-CM | POA: Diagnosis not present

## 2023-01-10 DIAGNOSIS — C9000 Multiple myeloma not having achieved remission: Secondary | ICD-10-CM

## 2023-01-10 DIAGNOSIS — M25511 Pain in right shoulder: Secondary | ICD-10-CM | POA: Diagnosis not present

## 2023-01-10 DIAGNOSIS — R278 Other lack of coordination: Secondary | ICD-10-CM | POA: Diagnosis not present

## 2023-01-10 DIAGNOSIS — M6281 Muscle weakness (generalized): Secondary | ICD-10-CM

## 2023-01-10 DIAGNOSIS — M542 Cervicalgia: Secondary | ICD-10-CM

## 2023-01-10 DIAGNOSIS — G8929 Other chronic pain: Secondary | ICD-10-CM

## 2023-01-10 DIAGNOSIS — R293 Abnormal posture: Secondary | ICD-10-CM

## 2023-01-10 NOTE — Progress Notes (Signed)
DISCONTINUE OFF PATHWAY REGIMEN - Multiple Myeloma and Other Plasma Cell Dyscrasias   [id number]:Bortezomib SUBQ V2,5,36,64 + Dexamethasone PO D1,8,15,22 + Lenalidomide PO D1-21 q28 Days for up to 3 Years Followed by Lenalidomide PO D1-28 q28 Days (Maintenance):   A cycle is every 28 days for up to 3 years:     Dexamethasone      Lenalidomide      Bortezomib    Followed by lenalidomide monotherapy: A cycle is every 28 days:     Lenalidomide   **Always confirm dose/schedule in your pharmacy ordering system**  REASON: Disease Progression PRIOR TREATMENT: Off Pathway: Bortezomib SUBQ Q0,3,47,42 + Dexamethasone PO D1,8,15,22 + Lenalidomide PO D1-21 q28 Days for up to 3 Years Followed by Lenalidomide PO D1-28 q28 Days (Maintenance) TREATMENT RESPONSE: Partial Response (PR)  START ON PATHWAY REGIMEN - Multiple Myeloma and Other Plasma Cell Dyscrasias     A cycle is every 28 days:     Carfilzomib      Carfilzomib      Carfilzomib      Dexamethasone      Dexamethasone   **Always confirm dose/schedule in your pharmacy ordering system**  Patient Characteristics: Multiple Myeloma, Relapsed / Refractory, Second through Fourth Lines of Therapy, Not a Candidate for CAR T-cell Therapy, Fit or Candidate for Triplet Therapy, Lenalidomide-Refractory or Lenalidomide-based Regimen Not Preferred, Not a Candidate for  Anti-CD38 Antibody Disease Classification: Multiple Myeloma Therapeutic Status: Refractory R2-ISS Staging: II Line of Therapy: Third Line Anti-CD38 Antibody Candidacy: Not a Candidate for Anti-CD38 Antibody Lenalidomide-based Regimen Preference/Candidacy: Lenalidomide-Refractory Intent of Therapy: Non-Curative / Palliative Intent, Discussed with Patient

## 2023-01-10 NOTE — Therapy (Signed)
OUTPATIENT PHYSICAL THERAPY ONCOLOGY TREATMENT  Patient Name: Travis [name] MRN: [id number] DOB:10/12/1950, 72 y.o., male Today's Date: 01/10/2023   END OF SESSION:  PT End of Session - 01/10/23 1551     Visit Number 26    Number of Visits 37    Date for PT Re-Evaluation 02/18/23    PT Start Time 1504    PT Stop Time 1551    PT Time Calculation (min) 47 min    Equipment Utilized During Treatment Gait belt    Activity Tolerance Patient tolerated treatment well    Behavior During Therapy WFL for tasks assessed/performed                     Past Medical History:  Diagnosis Date   A-fib (HCC)    Arthritis    "comes and goes; mostly in hands, occasionally elbow" (04/05/2017)   Complication of anesthesia    "just wanted to sleep alot  for couple days S/P VARICOCELE OR" (04/05/2017)   DVT (deep venous thrombosis) (HCC)    LLE   Dysrhythmia    PVCs    GERD (gastroesophageal reflux disease)    History of hiatal hernia    History of radiation therapy    Thoracic Spine- 07/13/22-07/23/22-Dr. Antony Blackbird   Hypertension    Impaired glucose tolerance    Multiple myeloma (HCC)    Obesity (BMI 30-39.9) 07/26/2016   OSA on CPAP 07/26/2016   Mild with AHI 12.5/hr now on CPAP at 14cm H2O   PAF (paroxysmal atrial fibrillation) (HCC)    s/p PVI Ablation in 2018 // Flecainide Rx // Echo 8/21: EF 60-65, no RWMA, mild LVH, normal RV SF, moderate RVE, mild BAE, trivial MR, mild dilation of aortic root (40 mm)   Pneumonia    "may have had walking pneumonia a few years ago" (04/05/2017)   Pre-diabetes    Thrombophlebitis    Past Surgical History:  Procedure Laterality Date   ATRIAL FIBRILLATION ABLATION  04/05/2017   ATRIAL FIBRILLATION ABLATION N/A 04/05/2017   Procedure: ATRIAL FIBRILLATION ABLATION;  Surgeon: [name], MD;  Location: [facility];  Service: Cardiovascular;  Laterality: N/A;   BONE BIOPSY Right 12/17/2021   Procedure: BONE BIOPSY;  Surgeon:  [name], MD;  Location: [facility];  Service: Orthopedics;  Laterality: Right;   COMPLETE RIGHT HIP REPLACMENT  01/19/2019   EVALUATION UNDER ANESTHESIA WITH HEMORRHOIDECTOMY N/A 02/24/2017   Procedure: EXAM UNDER ANESTHESIA WITH  HEMORRHOIDECTOMY;  Surgeon: [name], MD;  Location: [facility];  Service: General;  Laterality: N/A;   EXCISIONAL TOTAL HIP ARTHROPLASTY WITH ANTIBIOTIC SPACERS Right 09/25/2020   Procedure: EXCISIONAL TOTAL HIP ARTHROPLASTY WITH ANTIBIOTIC SPACERS;  Surgeon: [name], MD;  Location: [facility];  Service: Orthopedics;  Laterality: Right;   FLEXIBLE SIGMOIDOSCOPY N/A 04/15/2015   Procedure:  UNSEDATED FLEXIBLE SIGMOIDOSCOPY;  Surgeon: [name], MD;  Location: [facility];  Service: Endoscopy;  Laterality: N/A;   HUMERUS IM NAIL Right 12/17/2021   Procedure: INTRAMEDULLARY (IM) NAIL HUMERAL;  Surgeon: [name], MD;  Location: [facility];  Service: Orthopedics;  Laterality: Right;   KNEE ARTHROSCOPY Left ~ 2016   POSTERIOR CERVICAL FUSION/FORAMINOTOMY N/A 06/04/2022   Procedure: Thoracic one - Thoracic Four Posterior lateral arthrodesis/ fusion and Thoracic two Corpectomy;  Surgeon: [name], MD;  Location: [facility];  Service: Neurosurgery;  Laterality: N/A;   REIMPLANTATION OF TOTAL HIP Right 12/25/2020   Procedure: REIMPLANTATION/REVISION OF RIGHT  TOTAL HIP VERSUS REPEAT IRRIGATION AND DEBRIDEMENT;  Surgeon: [name], MD;  Location: [facility];  Service: Orthopedics;  Laterality: Right;   TESTICLE SURGERY  1988   VARICOCELE   TONSILLECTOMY     VARICOSE VEIN SURGERY Bilateral    Patient Active Problem List   Diagnosis Date Noted   Pancreatic cyst 09/30/2022   Infection due to novel influenza A virus 09/29/2022   Persistent atrial fibrillation (HCC) 07/15/2022   Acute on chronic diastolic heart failure (HCC) 07/03/2022   Pulmonary infiltrates 07/01/2022   Acute respiratory failure with hypoxia (HCC) 07/01/2022    Hyponatremia 06/29/2022   History of thoracic surgery 06/29/2022   Acute DVT of left tibial vein (HCC) 06/21/2022   Hypokalemia 06/21/2022   Vitamin D deficiency 05/30/2022   Other constipation 05/29/2022   Pathologic compression fracture of spine, initial encounter (HCC) 05/29/2022   Cord compression (HCC) 05/28/2022   Degenerative cervical disc 05/14/2022   Hypercoagulable state due to persistent atrial fibrillation (HCC) 01/11/2022   Multiple myeloma (HCC) 12/10/2021   Medication monitoring encounter 02/27/2021   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   Status post peripherally inserted central catheter (PICC) central line placement 11/06/2020   Drug rash with eosinophilia and systemic symptoms syndrome 11/06/2020   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   PAF (paroxysmal atrial fibrillation) (HCC)    GERD (gastroesophageal reflux disease) 03/29/2018   Hiatal hernia 03/29/2018   History of DVT (deep vein thrombosis) 03/29/2018   CAP (community acquired pneumonia) 03/29/2018   Osteoarthritis 03/29/2018   Neuropathy 08/29/2017   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   Paroxysmal atrial fibrillation with rapid ventricular response (HCC) 04/05/2017   Prolapsed internal hemorrhoids, grade 4, s/p ligation/pexy/hemorrhoidectomy x 2 02/24/2017 02/24/2017   OSA (obstructive sleep apnea) 07/26/2016   Obesity (BMI 30-39.9) 07/26/2016   Atrial fibrillation with RVR (HCC)    Essential hypertension    Chest pain at rest     PCP: Eleanora Neighbor, MD  REFERRING PROVIDER: Antony Blackbird, MD  REFERRING DIAG: Multiple Myeloma  THERAPY DIAG:  Compression fracture of thoracic vertebra, unspecified thoracic vertebral level, initial encounter (HCC)  Multiple myeloma not having achieved remission (HCC)  Muscle weakness (generalized)  Abnormal posture  Neck pain  Other lack of coordination  Chronic right shoulder pain  ONSET DATE: 2023  Rationale for  Evaluation and Treatment: Rehabilitation  SUBJECTIVE:                                                                                                                                                                                           SUBJECTIVE STATEMENT:  I did  more over the weekend to try and get more mobility and I think it helped. I feel sore in my neck and upper back, and I don;t think its as tight as it was. I lost 6 lbs of fluid yesterday. I am only getting very temporary relief now with the manual therapy.  PERTINENT HISTORY:  ARISTIDIS ADLER 72 y.o. male with medical history significant for IgA Kappa Multiple Myeloma. He was diagnosed with smouldering myeloma in 2018 . Last July he developed right arm pain due to a pathologic fx and he underwent radiation and fixation. In early Feb 2024 he had radiation for pathologic T2 compression fx followed by a T1-T4 fusion. PAIN:  Are you having pain?  PAIN:  Are you having pain? Yes NPRS scale:  3/10 neck/upper back, 0/10 LB presently Pain location: Left greater than right LB Pain orientation: Left  PAIN TYPE: aching Pain description: constant  Aggravating factors: standing,rising from chair Relieving factors: recliner, lying down,  PRECAUTIONS: Multiple Myeloma with mets to R ribs, R humerus with ORIF for pathological fracture 12/17/21. Lesions have also been found in R rib and also in his skull. Pathologic fx T2 with fusion T1-4 on 06/04/22, Bilateral LE edema (on Lasix), Afib   WEIGHT BEARING RESTRICTIONS: No  FALLS:  Has patient fallen in last 6 months? Yes. Number of falls 1  LIVING ENVIRONMENT: Lives with: lives with their family and lives with their spouse Lives in: House/apartment Stairs: Yes; Internal: 14 steps; on left going up and External: 3 steps; on right going up Has following equipment at home: Single point cane and Walker - 4 wheeled  OCCUPATION: attorney/ mostly retired?  LEISURE: golf (can't do now.), use to  play racquetball  HAND DOMINANCE: right   PRIOR LEVEL OF FUNCTION: Independent  POSTURE:significant slump sitting posture with forward head  PATIENT GOALS: decrease pain,get stronger,improve balance   OBJECTIVE:  COGNITION: Overall cognitive status: Within functional limits for tasks assessed   PALPATION: Very tender bilateral UT/levator, scapular regions  OBSERVATIONS / OTHER ASSESSMENTS: very flexed head and neck with chin nearly at chest  SENSATION: Light touch: Appears intact    POSTURE: significant forward head, round shoulders, increased kyphosis   Gait holding single point cane in left hand but not using. Right shoulder low, flexed neck;difficulty raising head to see where he is going  UPPER EXTREMITY AROM/PROM:  A/PROM RIGHT   eval  RIGHT 11/08/2022 RIGHT 12/20/2022  Shoulder extension     Shoulder flexion 91 with compensation 101 with compensation  101 with compensation  Shoulder abduction 67 with compensation 75 with compensation 75 compensation  Shoulder internal rotation     Shoulder external rotation C6      (Blank rows = not tested)  A/PROM LEFT   eval  Shoulder extension   Shoulder flexion 123  Shoulder abduction 112  Shoulder internal rotation T7  Shoulder external rotation t3    (Blank rows = not tested)  CERVICAL AROM: All within normal limits:    Percent limited 11/08/22 12/20/2022  Flexion Fully flexed    Extension From flexed position decreased 80% Decreased 70% Decreased 40%  Right lateral flexion     Left lateral flexion     Right rotation Dec 50% sitting in FH posture  Dec 50% from forward head posture  Left rotation        Dec 50%  Decreased 50% from forward head posture    UPPER EXTREMITY STRENGTH:    LOWER EXTREMITY AROM/PROM:  A/PROM Right  eval  Hip flexion   Hip extension   Hip abduction   Hip adduction   Hip internal rotation   Hip external rotation   Knee flexion   Knee extension   Ankle dorsiflexion   Ankle  plantarflexion   Ankle inversion   Ankle eversion    (Blank rows = not tested)  A/PROM LEFT eval  Hip flexion   Hip extension   Hip abduction   Hip adduction   Hip internal rotation   Hip external rotation   Knee flexion   Knee extension   Ankle dorsiflexion   Ankle plantarflexion   Ankle inversion   Ankle eversion    (Blank rows = not tested)  LOWER EXTREMITY MMT:    10/04/2022  4 position balance test; able to hold DLS feet together , and half tandem stance 10 seconds. Unable to maintain full tandem stance for 10 sec, unable to hold SLS greater than a second or 2 on left, and 4 seconds on right.     LYMPHEDEMA ASSESSMENTS:   SURGERY TYPE/DATE: 8/17/23Left prox humerus fixation for pathologic fx  2/2/2024Fusion T1-T4 for pathologic fx with cord compression 06/04/22 posterior cervical fusion/foraminotomy;T1-4 fusion NUMBER OF LYMPH NODES REMOVED: NA  CHEMOTHERAPY: Velcade/promalyst/Dex;  RADIATION:to prox humerus and thoracic spine pathologic fx  HORMONE TREATMENT: NA  INFECTIONS: NA  FUNCTIONAL TESTS:  10/09/2022;30 second sit to stand 10 with heavy use of arms  GAIT: Distance walked: 20 Assistive device utilized: carried SPC in left Level of assistance: Complete Independence Comments: feels unsteady, very flexed neck posture   QUICK DASH SURVEY:    TODAY'S TREATMENT:                                                                                                                                         DATE:   01/10/2023 Nu step seat 12, UE 8, Level 5 x 9 min, 727 steps Tandem stance x 2 B to failure,       SLS 4 times ea side to failure Mini squats x 10 Ax beam 8 lengths forward, 6 lengths sideways with intermittent hand touch Wall slides Right shoulder flexion x 5 Sit to stand x 10, 02 92%,  HR 67, recovered quickly to 96% Walked in clinic with SPC and CGA  360 ft, 02 92%, HR 71, recovered quickly with seated rest to 96%  01/07/2023 Nu Step seat 12, UE 8,  Lev 4 x 9 min, 661 steps, O2 98, HR 61 immediately after 6 in step up x10 B, then x 5 B light HH Minisquats with HH x 10 95%, HR 67 Ax beam 6 lengths forward and 6 sidestepping with light intermittent hand touch Incline stretch for calfs x 3 Marching no hands x 20 Heel raises no hands; 2 LOB requiring hands Tandem balance to failure 3 times each SLS to failure 4 times each STM seated in chair with  cocoa butter to bil UT/upper scapular regions, and cervical and thoracic paraspinals where pt palpably very tight.   12/29/22:Pt seen for aquatic therapy today.  Treatment took place in water 3.5-4.75 ft in depth at the [facility] pool. Temp of water was 91.  Pt entered/exited the pool via stairs with bilat rail independently. Pt requires buoyancy of water for support and to offload joints with strengthening exercises.Pt utilizes viscosity of the water required for strengthening.   Seated water bench with 75% submersion Pt performed seated LE AROM exercises 20x in all planes, with concurrent discussion of his current health status and pain assessment. 75% depth water walking with single buoy UE wts for push/pull 10 lengths. Hip 3 way kicks 15x Bil holding onto wall for balance with VC to push water a bit faster and adding ankle fins for more resistance. Small marching across pool holding single buoy floats under water 4 lengths f/f tandem walking holding floats 4x across pool.  Single leg press with small noodle 2x10 Bil with balance assist. Horizontal float with concurrent skin rolling on upper back and lateral sway for trunk stretching by PTA.    12/27/2022 Nu Step seat 12, UE 8, Lev 4 x 9 min, 810 steps, 02 98, HR 49 after exercising, RPE 3-4 6 in step ups x 10 B Mini squats holding bars x 15 RPE 3 Marching B x 15 with 0 to light HH, O2 96, HR 51 Sidestepping in bars 4 laps Finger ladder x 5 for flexion to 32 Tandem stance B x 2 ea on floor, then x 2 on ax beam Forward walking on beam 6  lengths, light hand touch to no hand touch Sidestepping on beam x 4 lengths light to no HH Manual Therapy STM seated in chair with cocoa butter to bil UT/upper scapular regions, and cervical and thoracic paraspinals where pt palpably very tight. 8/23/24Pt seen for aquatic therapy today.  Treatment took place in water 3.5-4.75 ft in depth at the [facility] pool. Temp of water was 91.  Pt entered/exited the pool via stairs with bilat rail independently. Pt requires buoyancy of water for support and to offload joints with strengthening exercises.Pt utilizes viscosity of the water required for strengthening.   Seated water bench with 75% submersion Pt performed seated LE AROM exercises 20x in all planes, with concurrent discussion of his current health status and pain assessment. 75% depth water walking with single buoy UE wts for push/pull 10 lengths. Hip 3 way kicks 15x Bil holding onto wall for balance with VC to push water a bit faster and adding ankle fins for more resistance. Small marching across pool holding single buoy floats under water 4 lengths f/f tandem walking holding floats 4x across pool.  Single leg press with small noodle 2x10 Bil with balance assist. Horizontal float with concurrent skin rolling on upper back and lateral sway for trunk stretching by PTA.  PATIENT EDUCATION:  Education details: sitting posture with lumbar roll Person educated: Patient Education method: Medical illustrator Education comprehension: verbalized understanding and returned demonstration  HOME EXERCISE PROGRAM: Doing HEP from HHPT; cervical ROM, shoulder rolls, shrugs, scap retraction, UT stretches  Aquatic Access Code: [email or web address] URL: [email or web address] Date: 11/09/2022 Prepared by: [facility] [address] This aquatic home exercise program from Med[employer] utilizes pictures from land based exercises, but has been adapted prior to lamination and  issuance.    ASSESSMENT:  CLINICAL IMPRESSION:  Pt ambulated 360 feet in clinic with SPC but  good gait pace with fatigue and some SOB. O2 sats decreased to 92 but quickly returned to 96%. Continued to have improved heart rate response to exercise today. His legs still fatigue fairly quickly and he requires seated rest. Manual therapy discontinued today secondary to no longer having a lasting effect. OBJECTIVE IMPAIRMENTS: decreased activity tolerance, decreased balance, difficulty walking, decreased ROM, decreased strength, increased edema, impaired UE functional use, postural dysfunction, and pain.   ACTIVITY LIMITATIONS: carrying, lifting, dressing, reach over head, and locomotion level  PARTICIPATION LIMITATIONS: cleaning, laundry, driving, and occupation  PERSONAL FACTORS:  Multiple Myeloma  are also affecting patient's functional outcome.   REHAB POTENTIAL: Good  CLINICAL DECISION MAKING: Evolving/moderate complexity  EVALUATION COMPLEXITY: Moderate  GOALS: Goals reviewed with patient? Yes  SHORT TERM GOALS: Target date: 10/21/22  Pt will be educated in and independent in proper sitting posture using lumbar roll prn Baseline: Goal status; MET 11/08/22 2.  Pt will report decreased muscle soreness in bilateral upper back x 25% Baseline:  Goal status: ONGOING  3.  Pt will be compliant with a HEP for postural strengthening/ROM Baseline:  Goal status: MET / LONG TERM GOALS: Target date: 12/20/22  Pt will be able to bring his head to neutral position for safety with ambulation/driving Baseline:  Goal status:Ongoing( but improved) 2.  Pt will have decreased upper back pain by 50% or more Baseline:  Goal status:Ongoing 3.  Pt will be able to flex right shoulder to 115 degrees with minimal compensation for improved reaching ability Baseline:  Goal status: ONGOING 4; Pt will be able to maintain tandem stance on both sides x 10 sec to demonstrated improved balance. Goal  status:MET right foot forward 11/08/2022, MET both legs 12/20/2022  5. Pt will be able to perform sit to stand x 5 without use of Ue's  Baseline:must use hands  Goal status:MET 12/20/2022 but difficult 6.  Pt will be able to maintain SLS on ea leg x 10 seconds without LOB  GOAL: ONGOING  PT FREQUENCY: 2x/week  PT DURATION: 6 weeks  PLANNED INTERVENTIONS: Therapeutic exercises, Therapeutic activity, Neuromuscular re-education, Balance training, Gait training, Patient/Family education, Self Care, Joint mobilization, Aquatic Therapy, Manual therapy, and Re-evaluation  PLAN FOR NEXT SESSION: Cont land and aquatic exercises working on posture, strength, balance, hold STM secondary to no longer having lasting benefit  Alvira Monday, PT 01/10/23 5:18 PM

## 2023-01-11 ENCOUNTER — Other Ambulatory Visit: Payer: Self-pay | Admitting: *Deleted

## 2023-01-11 ENCOUNTER — Encounter: Payer: Self-pay | Admitting: Hematology and Oncology

## 2023-01-11 MED ORDER — DEXAMETHASONE 4 MG PO TABS: 40.0000 mg | ORAL_TABLET | ORAL | 2 refills | Status: DC

## 2023-01-12 ENCOUNTER — Other Ambulatory Visit: Payer: Self-pay | Admitting: Hematology and Oncology

## 2023-01-12 ENCOUNTER — Inpatient Hospital Stay (HOSPITAL_BASED_OUTPATIENT_CLINIC_OR_DEPARTMENT_OTHER): Payer: Medicare Other | Admitting: Hematology and Oncology

## 2023-01-12 ENCOUNTER — Encounter: Payer: Self-pay | Admitting: Hematology and Oncology

## 2023-01-12 ENCOUNTER — Inpatient Hospital Stay: Payer: Medicare Other

## 2023-01-12 ENCOUNTER — Other Ambulatory Visit: Payer: Self-pay | Admitting: *Deleted

## 2023-01-12 VITALS — BP 116/47 | HR 58 | Temp 98.0°F | Resp 20 | Wt 255.5 lb

## 2023-01-12 VITALS — BP 120/60 | HR 60 | Resp 17

## 2023-01-12 DIAGNOSIS — Z7952 Long term (current) use of systemic steroids: Secondary | ICD-10-CM | POA: Diagnosis not present

## 2023-01-12 DIAGNOSIS — I10 Essential (primary) hypertension: Secondary | ICD-10-CM | POA: Diagnosis not present

## 2023-01-12 DIAGNOSIS — E876 Hypokalemia: Secondary | ICD-10-CM | POA: Diagnosis not present

## 2023-01-12 DIAGNOSIS — M5126 Other intervertebral disc displacement, lumbar region: Secondary | ICD-10-CM | POA: Diagnosis not present

## 2023-01-12 DIAGNOSIS — M48061 Spinal stenosis, lumbar region without neurogenic claudication: Secondary | ICD-10-CM | POA: Diagnosis not present

## 2023-01-12 DIAGNOSIS — I5033 Acute on chronic diastolic (congestive) heart failure: Secondary | ICD-10-CM | POA: Diagnosis not present

## 2023-01-12 DIAGNOSIS — R600 Localized edema: Secondary | ICD-10-CM | POA: Diagnosis not present

## 2023-01-12 DIAGNOSIS — C9000 Multiple myeloma not having achieved remission: Secondary | ICD-10-CM

## 2023-01-12 DIAGNOSIS — M25519 Pain in unspecified shoulder: Secondary | ICD-10-CM | POA: Diagnosis not present

## 2023-01-12 DIAGNOSIS — Z7901 Long term (current) use of anticoagulants: Secondary | ICD-10-CM | POA: Diagnosis not present

## 2023-01-12 DIAGNOSIS — I480 Paroxysmal atrial fibrillation: Secondary | ICD-10-CM | POA: Diagnosis not present

## 2023-01-12 DIAGNOSIS — I708 Atherosclerosis of other arteries: Secondary | ICD-10-CM | POA: Diagnosis not present

## 2023-01-12 DIAGNOSIS — M533 Sacrococcygeal disorders, not elsewhere classified: Secondary | ICD-10-CM | POA: Diagnosis not present

## 2023-01-12 DIAGNOSIS — I5030 Unspecified diastolic (congestive) heart failure: Secondary | ICD-10-CM | POA: Diagnosis not present

## 2023-01-12 DIAGNOSIS — M546 Pain in thoracic spine: Secondary | ICD-10-CM | POA: Diagnosis not present

## 2023-01-12 DIAGNOSIS — Z7961 Long term (current) use of immunomodulator: Secondary | ICD-10-CM | POA: Diagnosis not present

## 2023-01-12 DIAGNOSIS — M542 Cervicalgia: Secondary | ICD-10-CM | POA: Diagnosis not present

## 2023-01-12 DIAGNOSIS — G4733 Obstructive sleep apnea (adult) (pediatric): Secondary | ICD-10-CM | POA: Diagnosis not present

## 2023-01-12 DIAGNOSIS — R1310 Dysphagia, unspecified: Secondary | ICD-10-CM | POA: Diagnosis not present

## 2023-01-12 DIAGNOSIS — R202 Paresthesia of skin: Secondary | ICD-10-CM | POA: Diagnosis not present

## 2023-01-12 DIAGNOSIS — R21 Rash and other nonspecific skin eruption: Secondary | ICD-10-CM | POA: Diagnosis not present

## 2023-01-12 DIAGNOSIS — Z79624 Long term (current) use of inhibitors of nucleotide synthesis: Secondary | ICD-10-CM | POA: Diagnosis not present

## 2023-01-12 DIAGNOSIS — Z5112 Encounter for antineoplastic immunotherapy: Secondary | ICD-10-CM | POA: Diagnosis not present

## 2023-01-12 DIAGNOSIS — I700 Atherosclerosis of aorta: Secondary | ICD-10-CM | POA: Diagnosis not present

## 2023-01-12 LAB — CMP (CANCER CENTER ONLY)
ALT: 18 U/L (ref 0–44)
AST: 15 U/L (ref 15–41)
Albumin: 3.8 g/dL (ref 3.5–5.0)
Alkaline Phosphatase: 71 U/L (ref 38–126)
Anion gap: 8 (ref 5–15)
BUN: 35 mg/dL — ABNORMAL HIGH (ref 8–23)
CO2: 34 mmol/L — ABNORMAL HIGH (ref 22–32)
Calcium: 9 mg/dL (ref 8.9–10.3)
Chloride: 97 mmol/L — ABNORMAL LOW (ref 98–111)
Creatinine: 1.38 mg/dL — ABNORMAL HIGH (ref 0.61–1.24)
GFR, Estimated: 55 mL/min — ABNORMAL LOW (ref >60)
Glucose, Bld: 125 mg/dL — ABNORMAL HIGH (ref 70–99)
Potassium: 3.1 mmol/L — ABNORMAL LOW (ref 3.5–5.1)
Sodium: 139 mmol/L (ref 135–145)
Total Bilirubin: 0.4 mg/dL (ref 0.3–1.2)
Total Protein: 6 g/dL — ABNORMAL LOW (ref 6.5–8.1)

## 2023-01-12 LAB — CBC WITH DIFFERENTIAL (CANCER CENTER ONLY)
Abs Immature Granulocytes: 0.08 10*3/uL — ABNORMAL HIGH (ref 0.00–0.07)
Basophils Absolute: 0.1 10*3/uL (ref 0.0–0.1)
Basophils Relative: 0 %
Eosinophils Absolute: 0.1 10*3/uL (ref 0.0–0.5)
Eosinophils Relative: 1 %
HCT: 37.3 % — ABNORMAL LOW (ref 39.0–52.0)
Hemoglobin: 11.9 g/dL — ABNORMAL LOW (ref 13.0–17.0)
Immature Granulocytes: 1 %
Lymphocytes Relative: 5 %
Lymphs Abs: 0.6 10*3/uL — ABNORMAL LOW (ref 0.7–4.0)
MCH: 30 pg (ref 26.0–34.0)
MCHC: 31.9 g/dL (ref 30.0–36.0)
MCV: 94 fL (ref 80.0–100.0)
Monocytes Absolute: 1 10*3/uL (ref 0.1–1.0)
Monocytes Relative: 8 %
Neutro Abs: 11.2 10*3/uL — ABNORMAL HIGH (ref 1.7–7.7)
Neutrophils Relative %: 85 %
Platelet Count: 216 10*3/uL (ref 150–400)
RBC: 3.97 MIL/uL — ABNORMAL LOW (ref 4.22–5.81)
RDW: 17.7 % — ABNORMAL HIGH (ref 11.5–15.5)
WBC Count: 13 10*3/uL — ABNORMAL HIGH (ref 4.0–10.5)
nRBC: 0 % (ref 0.0–0.2)

## 2023-01-12 MED ORDER — SODIUM CHLORIDE 0.9 % IV SOLN: Freq: Once | INTRAVENOUS | Status: AC

## 2023-01-12 MED ORDER — SODIUM CHLORIDE 0.9 % IV SOLN: Freq: Once | INTRAVENOUS | Status: DC

## 2023-01-12 MED ORDER — DEXTROSE 5 % IV SOLN
20.0000 mg/m2 | Freq: Once | INTRAVENOUS | Status: AC
  Administered 2023-01-12: 40 mg via INTRAVENOUS
  Filled 2023-01-12: qty 15

## 2023-01-12 MED ORDER — PROCHLORPERAZINE MALEATE 10 MG PO TABS: 10.0000 mg | ORAL_TABLET | Freq: Four times a day (QID) | ORAL | 1 refills | Status: DC | PRN

## 2023-01-12 MED ORDER — ACYCLOVIR 400 MG PO TABS
400.0000 mg | ORAL_TABLET | Freq: Every day | ORAL | 3 refills | Status: DC
Start: 2023-01-12 — End: 2023-04-07

## 2023-01-12 MED ORDER — ONDANSETRON HCL 8 MG PO TABS: 8.0000 mg | ORAL_TABLET | Freq: Three times a day (TID) | ORAL | 1 refills | Status: DC | PRN

## 2023-01-12 NOTE — Patient Instructions (Signed)
[facility]  Discharge Instructions: Thank you for choosing [facility] to provide your oncology and hematology care.   If you have a lab appointment with the Cancer Center, please go directly to the Cancer Center and check in at the registration area.   Wear comfortable clothing and clothing appropriate for easy access to any Portacath or PICC line.   We strive to give you quality time with your provider. You may need to reschedule your appointment if you arrive late (15 or more minutes).  Arriving late affects you and other patients whose appointments are after yours.  Also, if you miss three or more appointments without notifying the office, you may be dismissed from the clinic at the provider's discretion.      For prescription refill requests, have your pharmacy contact our office and allow 72 hours for refills to be completed.    Today you received the following chemotherapy and/or immunotherapy agents: Kyprolis      To help prevent nausea and vomiting after your treatment, we encourage you to take your nausea medication as directed.  BELOW ARE SYMPTOMS THAT SHOULD BE REPORTED IMMEDIATELY: *FEVER GREATER THAN 100.4 F (38 C) OR HIGHER *CHILLS OR SWEATING *NAUSEA AND VOMITING THAT IS NOT CONTROLLED WITH YOUR NAUSEA MEDICATION *UNUSUAL SHORTNESS OF BREATH *UNUSUAL BRUISING OR BLEEDING *URINARY PROBLEMS (pain or burning when urinating, or frequent urination) *BOWEL PROBLEMS (unusual diarrhea, constipation, pain near the anus) TENDERNESS IN MOUTH AND THROAT WITH OR WITHOUT PRESENCE OF ULCERS (sore throat, sores in mouth, or a toothache) UNUSUAL RASH, SWELLING OR PAIN  UNUSUAL VAGINAL DISCHARGE OR ITCHING   Items with * indicate a potential emergency and should be followed up as soon as possible or go to the Emergency Department if any problems should occur.  Please show the CHEMOTHERAPY ALERT CARD or IMMUNOTHERAPY ALERT CARD at  check-in to the Emergency Department and triage nurse.  Should you have questions after your visit or need to cancel or reschedule your appointment, please contact [facility]  Dept: [phone]  and follow the prompts.  Office hours are 8:00 a.m. to 4:30 p.m. Monday - Friday. Please note that voicemails left after 4:00 p.m. may not be returned until the following business day.  We are closed weekends and major holidays. You have access to a nurse at all times for urgent questions. Please call the main number to the clinic Dept: [phone] and follow the prompts.   For any non-urgent questions, you may also contact your provider using MyChart. We now offer e-Visits for anyone 22 and older to request care online for non-urgent symptoms. For details visit [email or web address].   Also download the MyChart app! Go to the app store, search "MyChart", open the app, select [place], and log in with your MyChart username and password.  Carfilzomib Injection What is this medication? CARFILZOMIB (kar FILZ oh mib) treats multiple myeloma, a type of bone marrow cancer. It works by blocking a protein that causes cancer cells to grow and multiply. This helps to slow or stop the spread of cancer cells. This medicine may be used for other purposes; ask your health care provider or pharmacist if you have questions. COMMON BRAND NAME(S): KYPROLIS What should I tell my care team before I take this medication? They need to know if you have any of these conditions: Heart disease History of blood clots Irregular heartbeat Kidney disease Liver disease Lung or breathing  disease An unusual or allergic reaction to carfilzomib, or other medications, foods, dyes, or preservatives If you or your partner are pregnant or trying to get pregnant Breastfeeding How should I use this medication? This medication is injected into a vein. It is given by your care team in a hospital  or clinic setting. Talk to your care team about the use of this medication in children. Special care may be needed. Overdosage: If you think you have taken too much of this medicine contact a poison control center or emergency room at once. NOTE: This medicine is only for you. Do not share this medicine with others. What if I miss a dose? Keep appointments for follow-up doses. It is important not to miss your dose. Call your care team if you are unable to keep an appointment. What may interact with this medication? Interactions are not expected. This list may not describe all possible interactions. Give your health care provider a list of all the medicines, herbs, non-prescription drugs, or dietary supplements you use. Also tell them if you smoke, drink alcohol, or use illegal drugs. Some items may interact with your medicine. What should I watch for while using this medication? Your condition will be monitored carefully while you are receiving this medication. You may need blood work while taking this medication. Check with your care team if you have severe diarrhea, nausea, and vomiting, or if you sweat a lot. The loss of too much body fluid may make it dangerous for you to take this medication. This medication may affect your coordination, reaction time, or judgment. Do not drive or operate machinery until you know how this medication affects you. Sit up or stand slowly to reduce the risk of dizzy or fainting spells. Drinking alcohol with this medication can increase the risk of these side effects. Talk to your care team if you may be pregnant. Serious birth defects can occur if you take this medication during pregnancy and for 6 months after the last dose. You will need a negative pregnancy test before starting this medication. Contraception is recommended while taking this medication and for 6 months after the last dose. Your care team can help you find an option that works for you. If your  partner can get pregnant, use a condom during sex while taking this medication and for 3 months after the last dose. Do not breastfeed while taking this medication and for 2 weeks after the last dose. This medication may cause infertility. Talk to your care team if you are concerned about your fertility. What side effects may I notice from receiving this medication? Side effects that you should report to your care team as soon as possible: Allergic reactions--skin rash, itching, hives, swelling of the face, lips, tongue, or throat Bleeding--bloody or black, tar-like stools, vomiting blood or brown material that looks like coffee grounds, red or dark brown urine, small red or purple spots on skin, unusual bruising or bleeding Blood clot--pain, swelling, or warmth in the leg, shortness of breath, chest pain Dizziness, loss of balance or coordination, confusion or trouble speaking Heart attack--pain or tightness in the chest, shoulders, arms, or jaw, nausea, shortness of breath, cold or clammy skin, feeling faint or lightheaded Heart failure--shortness of breath, swelling of the ankles, feet, or hands, sudden weight gain, unusual weakness or fatigue Heart rhythm changes--fast or irregular heartbeat, dizziness, feeling faint or lightheaded, chest pain, trouble breathing Increase in blood pressure Infection--fever, chills, cough, sore throat, wounds that don't heal, pain or  trouble when passing urine, general feeling of discomfort or being unwell Infusion reactions--chest pain, shortness of breath or trouble breathing, feeling faint or lightheaded Kidney injury--decrease in the amount of urine, swelling of the ankles, hands, or feet Liver injury--right upper belly pain, loss of appetite, nausea, light-colored stool, dark yellow or brown urine, yellowing skin or eyes, unusual weakness or fatigue Lung injury--shortness of breath or trouble breathing, cough, spitting up blood, chest pain, fever Pulmonary  hypertension--shortness of breath, chest pain, fast or irregular heartbeat, feeling faint or lightheaded, fatigue, swelling of the ankles or feet Stomach pain, bloody diarrhea, pale skin, unusual weakness or fatigue, decrease in the amount of urine, which may be signs of hemolytic uremic syndrome Sudden and severe headache, confusion, change in vision, seizures, which may be signs of posterior reversible encephalopathy syndrome (PRES) TTP--purple spots on the skin or inside the mouth, pale skin, yellowing skin or eyes, unusual weakness or fatigue, fever, fast or irregular heartbeat, confusion, change in vision, trouble speaking, trouble walking Tumor lysis syndrome (TLS)--nausea, vomiting, diarrhea, decrease in the amount of urine, dark urine, unusual weakness or fatigue, confusion, muscle pain or cramps, fast or irregular heartbeat, joint pain Side effects that usually do not require medical attention (report to your care team if they continue or are bothersome): Diarrhea Fatigue Nausea Trouble sleeping This list may not describe all possible side effects. Call your doctor for medical advice about side effects. You may report side effects to FDA at 1-800-FDA-1088. Where should I keep my medication? This medication is given in a hospital or clinic. It will not be stored at home. NOTE: This sheet is a summary. It may not cover all possible information. If you have questions about this medicine, talk to your doctor, pharmacist, or health care provider.  2024 [employer] Standard (2021-09-17 00:00:00)

## 2023-01-12 NOTE — Addendum Note (Signed)
Addended by: Jeanie Sewer T on: 01/12/2023 09:38 AM   Modules accepted: Orders

## 2023-01-12 NOTE — Addendum Note (Signed)
Addended by: Vanessa Kick on: 01/12/2023 09:34 AM   Modules accepted: Orders

## 2023-01-12 NOTE — Progress Notes (Signed)
[name] [phone]   [phone]  PROGRESS NOTE  Patient Care Team: Travis Aspen, MD as PCP - General (Internal Medicine) Pricilla Riffle, MD as PCP - Cardiology (Cardiology) Quintella Reichert, MD as PCP - Sleep Medicine (Cardiology) Lanier Prude, MD as PCP - Electrophysiology (Cardiology) [name], MD as Consulting Physician (General Surgery) Vida Rigger, MD as Consulting Physician (Gastroenterology) Quintella Reichert, MD as Consulting Physician (Sleep Medicine) Glendale Chard, DO as Consulting Physician (Neurology)  Hematological/Oncological History # IgA Kappa Multiple Myeloma 05/05/2020: Bone marrow biopsy showed increased number of plasma cells representing 14% of all cells in the aspirate associated with numerous variably sized clusters in the clot and biopsy sections 04/10/2021: M protein 0.3, IgA kappa specificity. Kappa 580.5, Labda 7.0, ratio 82.93.   12/07/2021: Labs show of 1971.3, lambda 5.5, ratio 358.42 12/10/2021: Transition care to Dr. Leonides Schanz. 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  01/26/2022: Cycle 2 Day 1 of Dara/Rev/Dex 02/22/2022: Cycle 3 Day 1 of Dara/Rev/Dex 03/23/2022: Cycle 4 Day 1 of Dara/Rev/Dex 04/20/2022: Cycle 5 Day 1 of Dara/Rev/Dex 05/18/2022: Cycle 6 Day 1 of Dara/Rev/Dex 05/27/2022: MRI cervical/thoracic/lumbar: pathologic fracture at T2 with significant osseous retropulsion and resulting cord compression. Additional smaller lesions within T1 vertebral body, right C7 and T3 articulating facets.  Small Multiple Myeloma lesions scattered at all lumbar levels and in the visible left sacral ala. Largest lumbar tumor is 2.3 cm in the left L1 posterior elements and pedicle. No lumbar extraosseous or epidural tumor extension, or pathologic fracture.Underlying chronic lumbar spine  degeneration, with a new central disc herniation at L3-L4 since 2021 now resulting in mild to moderate degenerative spinal stenosis there. 05/28/2022-06/09/2022: Admitted for T2 pathologic fracture and cord compression. He received urgent radiation therapy for a total of 3.0 Gy. He underwent  thoracic 1 through 4 posterior lateral arthrodesis/fusion and 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  06/23/2022: Cycle 1 Day 1 of Velcade/Pomalyst/Dex 08/04/2022: Cycle 2 Day 1 of Velcade/Pomalyst/Dex. Pomalyst to be decreased to 2mg  PO daily due to cytopenias.  09/01/2022: Cycle 3 Day 1 of Velcade/Pomalyst/Dex. (Missed 2nd dose of cycle due to hospitalization)  10/06/2022: Cycle 4 Day 1 of Velcade/Pomalyst/Dex.  10/27/2022: Cycle 5 Day 1 of Velcade/Pomalyst/Dex.  12/02/2022: Cycle 6 Day 1 of Velcade/Pomalyst/Dex. 01/12/2023: Cycle 1 Day 1 of Kyprolis/Dex    Interval History:  Travis [name] 72 y.o. male with medical history significant for IgA Kappa Multiple Myeloma who presents for a follow up visit. He was last seen on 12/29/2022. In the interim, he continued on Velcade/Pomalyst/Dex therapy.  On exam today Travis [name] reports he feels pain and soreness in the muscles of his neck and back after moving to 24 packs of water from the garage to his kitchen.  He reports he has been taking Tylenol for this but does have access to muscle relaxers which she has not yet taken.  He has been working with physical therapy to [facility] as well as using the pool and hot tub.  He notes that otherwise he has been his baseline level of [name] with no recent illnesses.  He denies any runny nose, sore throat, or cough.  We discussed the risks and benefits of Kyprolis chemotherapy and he was willing and able to proceed at this time. He denies fevers, chills, sweats, shortness of breath, chest pain or cough. He has  no other complaints. Rest of the 10 point ROS is below.     MEDICAL HISTORY:  Past Medical History:   Diagnosis Date   A-fib [facility]    Arthritis    "comes and goes; mostly in hands, occasionally elbow" (04/05/2017)   Complication of anesthesia    "just wanted to sleep alot  for couple days S/P VARICOCELE OR" (04/05/2017)   DVT (deep venous thrombosis) (HCC)    LLE   Dysrhythmia    PVCs    GERD (gastroesophageal reflux disease)    History of hiatal hernia    History of radiation therapy    Thoracic Spine- 07/13/22-07/23/22-Dr. Antony Blackbird   Hypertension    Impaired glucose tolerance    Multiple myeloma (HCC)    Obesity (BMI 30-39.9) 07/26/2016   OSA on CPAP 07/26/2016   Mild with AHI 12.5/hr now on CPAP at 14cm H2O   PAF (paroxysmal atrial fibrillation) (HCC)    s/p PVI Ablation in 2018 // Flecainide Rx // Echo 8/21: EF 60-65, no RWMA, mild LVH, normal RV SF, moderate RVE, mild BAE, trivial MR, mild dilation of aortic root (40 mm)   Pneumonia    "may have had walking pneumonia a few years ago" (04/05/2017)   Pre-diabetes    Thrombophlebitis     SURGICAL HISTORY: Past Surgical History:  Procedure Laterality Date   ATRIAL FIBRILLATION ABLATION  04/05/2017   ATRIAL FIBRILLATION ABLATION N/A 04/05/2017   Procedure: ATRIAL FIBRILLATION ABLATION;  Surgeon: [name], MD;  Location: [facility];  Service: Cardiovascular;  Laterality: N/A;   BONE BIOPSY Right 12/17/2021   Procedure: BONE BIOPSY;  Surgeon: [name], MD;  Location: [facility];  Service: Orthopedics;  Laterality: Right;   COMPLETE RIGHT HIP REPLACMENT  01/19/2019   EVALUATION UNDER ANESTHESIA WITH HEMORRHOIDECTOMY N/A 02/24/2017   Procedure: EXAM UNDER ANESTHESIA WITH  HEMORRHOIDECTOMY;  Surgeon: [name], MD;  Location: [facility];  Service: General;  Laterality: N/A;   EXCISIONAL TOTAL HIP ARTHROPLASTY WITH ANTIBIOTIC SPACERS Right 09/25/2020   Procedure: EXCISIONAL TOTAL HIP ARTHROPLASTY WITH ANTIBIOTIC SPACERS;  Surgeon: [name], MD;  Location: [facility];  Service: Orthopedics;   Laterality: Right;   FLEXIBLE SIGMOIDOSCOPY N/A 04/15/2015   Procedure:  UNSEDATED FLEXIBLE SIGMOIDOSCOPY;  Surgeon: [name], MD;  Location: [facility];  Service: Endoscopy;  Laterality: N/A;   HUMERUS IM NAIL Right 12/17/2021   Procedure: INTRAMEDULLARY (IM) NAIL HUMERAL;  Surgeon: [name], MD;  Location: [facility];  Service: Orthopedics;  Laterality: Right;   KNEE ARTHROSCOPY Left ~ 2016   POSTERIOR CERVICAL FUSION/FORAMINOTOMY N/A 06/04/2022   Procedure: Thoracic one - Thoracic Four Posterior lateral arthrodesis/ fusion and Thoracic two Corpectomy;  Surgeon: [name], MD;  Location: [facility];  Service: Neurosurgery;  Laterality: N/A;   REIMPLANTATION OF TOTAL HIP Right 12/25/2020   Procedure: REIMPLANTATION/REVISION OF RIGHT TOTAL HIP VERSUS REPEAT IRRIGATION AND DEBRIDEMENT;  Surgeon: [name], MD;  Location: [facility];  Service: Orthopedics;  Laterality: Right;   TESTICLE SURGERY  1988   VARICOCELE   TONSILLECTOMY     VARICOSE VEIN SURGERY Bilateral     SOCIAL HISTORY: Social History   Socioeconomic History   Marital status: Married    Spouse name: Not on file   Number of children: 1   Years of education: law school   Highest education level: Not on file  Occupational History   Occupation: lawyer  Tobacco Use   Smoking status: Never  Smokeless tobacco: Never  Vaping Use   Vaping status: Never Used  Substance and Sexual Activity   Alcohol use: Yes    Alcohol/week: 2.0 standard drinks of alcohol    Types: 2 Glasses of wine per week    Comment: 2 glasses of wine a week, occ bourbon, beer   Drug use: No   Sexual activity: Not Currently  Other Topics Concern   Not on file  Social History Narrative   Lives in [place] with spouse in a 2 story home.  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 1 daughter.     Education: Social worker school.    Social Determinants of [name]   Financial Resource Strain: Not on file  Food  Insecurity: No Food Insecurity (09/27/2022)   Hunger Vital Sign    Worried About Running Out of Food in the Last Year: Never true    Ran Out of Food in the Last Year: Never true  Transportation Needs: No Transportation Needs (09/27/2022)   PRAPARE - Administrator, Civil Service (Medical): No    Lack of Transportation (Non-Medical): No  Physical Activity: Not on file  Stress: Not on file  Social Connections: Unknown (09/15/2021)   [name]   Social Network    Social Network: Not on file  Intimate Partner Violence: Not At Risk (09/27/2022)   Humiliation, Afraid, Rape, and Kick questionnaire    Fear of Current or Ex-Partner: No    Emotionally Abused: No    Physically Abused: No    Sexually Abused: No    FAMILY HISTORY: Family History  Problem Relation Age of Onset   Dementia Mother    Stroke Father    Atrial fibrillation Father    Hypertension Father    Hypertension Paternal Grandfather    Dementia Maternal Grandmother     ALLERGIES:  is allergic to linezolid and vancomycin.  MEDICATIONS:  Current Outpatient Medications  Medication Sig Dispense Refill   acyclovir (ZOVIRAX) 400 MG tablet Take 1 tablet (400 mg total) by mouth 2 (two) times daily. 180 tablet 3   albuterol (VENTOLIN HFA) 108 (90 Base) MCG/ACT inhaler Inhale 2 puffs into the lungs every 6 (six) hours as needed for wheezing or shortness of breath. 8 g 0   amiodarone (PACERONE) 200 MG tablet Take 1 tablet (200 mg total) by mouth daily. Start after finishing 40 mg twice daily dose of  days (Patient taking differently: Take 200 mg by mouth daily.) 180 tablet 1   calcium carbonate (TUMS - DOSED IN MG ELEMENTAL CALCIUM) 500 MG chewable tablet Chew 1 tablet by mouth daily.     cholecalciferol (VITAMIN D3) 25 MCG (1000 UNIT) tablet Take 1,000 Units by mouth daily.     Cyanocobalamin (VITAMIN B-12 PO) Take 1,000 mcg by mouth daily.     dexamethasone (DECADRON) 4 MG tablet Take 10  tablets (40 mg total) by mouth every 7 (seven) days. Take 40 mg by mouth once a day in the morning of chemo on Wednesdays- or unless otherwise instructed 40 tablet 2   ELIQUIS 5 MG TABS tablet TAKE ONE TABLET BY MOUTH TWICE A DAY 60 tablet 6   fluticasone (FLONASE) 50 MCG/ACT nasal spray Place 1 spray into both nostrils daily as needed for allergies or rhinitis.     gabapentin (NEURONTIN) 300 MG capsule Take 2 capsules (600 mg total) by mouth 2 (two) times daily. 360 capsule 1   hydrALAZINE (APRESOLINE) 50  MG tablet Take 1.5 tablets (75 mg total) by mouth 3 (three) times daily. (Patient taking differently: Take 100 mg by mouth 3 (three) times daily. 100 mg in the morning, 75 mg afternoon, 100 mg at night) 135 tablet 3   irbesartan (AVAPRO) 150 MG tablet Take 1 tablet (150 mg total) by mouth daily. 30 tablet 5   metolazone (ZAROXOLYN) 2.5 MG tablet Take 1 tablet (2.5 mg total) by mouth every other day. 45 tablet 3   metoprolol succinate (TOPROL-XL) 50 MG 24 hr tablet Take 1/2 to 1 as needed up to twice a day for AFIB and heart rate above 100. 90 tablet 3   Omega-3 Fatty Acids (FISH OIL PO) Take 1 Capful by mouth daily.     polyethylene glycol powder (GLYCOLAX/MIRALAX) 17 GM/SCOOP powder Take 1 Container by mouth daily.     potassium chloride SA (KLOR-CON M) 20 MEQ tablet Take 2 tablets (40 mEq total) by mouth 3 (three) times daily. Take an extra 2 tablets on the day you take the Metolazone (80 meq total)     sodium chloride (OCEAN) 0.65 % SOLN nasal spray Place 1 spray into both nostrils as needed for congestion.     TART CHERRY PO Take 1 tablet by mouth at bedtime.     torsemide (DEMADEX) 20 MG tablet Take 2 tablets (40 mg total) by mouth daily. 90 tablet 3   acyclovir (ZOVIRAX) 400 MG tablet Take 1 tablet (400 mg total) by mouth daily. 30 tablet 3   ondansetron (ZOFRAN) 8 MG tablet Take 1 tablet (8 mg total) by mouth every 8 (eight) hours as needed for nausea or vomiting. 30 tablet 1   pomalidomide  (POMALYST) 2 MG capsule Take 1 capsule (2 mg total) by mouth daily. Take one tablet (2 mg by mouth) daily for 21 days, then take 7 days off [employer] auth # [id number] Date obtained  12/29/22 21 capsule 0   prochlorperazine (COMPAZINE) 10 MG tablet Take 1 tablet (10 mg total) by mouth every 6 (six) hours as needed for nausea or vomiting. 30 tablet 1   No current facility-administered medications for this visit.   Facility-Administered Medications Ordered in Other Visits  Medication Dose Route Frequency Provider Last Rate Last Admin   0.9 %  sodium chloride infusion   Intravenous Once Jaci Standard, MD        REVIEW OF SYSTEMS:   Constitutional: ( - ) fevers, ( - )  chills , ( - ) night sweats Eyes: ( - ) blurriness of vision, ( - ) double vision, ( - ) watery eyes Ears, nose, mouth, throat, and face: ( - ) mucositis, ( - ) sore throat Respiratory: ( - ) cough, ( - ) dyspnea, ( - ) wheezes Cardiovascular: ( - ) palpitation, ( - ) chest discomfort, ( - ) lower extremity swelling Gastrointestinal:  ( - ) nausea, ( - ) heartburn, ( - ) change in bowel habits Skin: ( - ) abnormal skin rashes Lymphatics: ( - ) new lymphadenopathy, ( - ) easy bruising Neurological: ( - ) numbness, ( - ) tingling, ( - ) new weaknesses Behavioral/Psych: ( - ) mood change, ( - ) new changes  All other systems were reviewed with the patient and are negative.  PHYSICAL EXAMINATION: ECOG PERFORMANCE STATUS: 2 - Symptomatic, <50% confined to bed  Vitals:   01/12/23 1019  BP: (!) 116/47  Pulse: (!) 58  Resp: 20  Temp: 98 F (36.7 C)  SpO2: 97%     Filed Weights   01/12/23 1019  Weight: 255 lb 8 oz (115.9 kg)      GENERAL: Well-appearing elderly Caucasian male, alert, no distress and comfortable SKIN: no overt abnormalities of the skin.  EYES: conjunctiva are pink and non-injected, sclera clear LUNGS: clear to auscultation and percussion with normal breathing effort HEART: tachycardic &  no murmurs.  +bilateral lower extremity edema Musculoskeletal: no cyanosis of digits and no clubbing  PSYCH: alert & oriented x 3, fluent speech NEURO: no focal motor/sensory deficits  LABORATORY DATA:  I have reviewed the data as listed    Latest Ref Rng & Units 01/12/2023    9:45 AM 01/05/2023   12:44 PM 12/29/2022   12:31 PM  CBC  WBC 4.0 - 10.5 K/uL 13.0  10.1  6.5   Hemoglobin 13.0 - 17.0 g/dL 19.1  47.8  29.5   Hematocrit 39.0 - 52.0 % 37.3  36.2  34.5   Platelets 150 - 400 K/uL 216  192  142        Latest Ref Rng & Units 01/12/2023    9:45 AM 01/05/2023   12:44 PM 12/31/2022   10:10 AM  CMP  Glucose 70 - 99 mg/dL 621  308  657   BUN 8 - 23 mg/dL 35  29  39   Creatinine 0.61 - 1.24 mg/dL 8.46  9.62  9.52   Sodium 135 - 145 mmol/L 139  142  142   Potassium 3.5 - 5.1 mmol/L 3.1  3.2  3.2   Chloride 98 - 111 mmol/L 97  101  99   CO2 22 - 32 mmol/L 34  30  30   Calcium 8.9 - 10.3 mg/dL 9.0  8.9  9.0   Total Protein 6.5 - 8.1 g/dL 6.0  6.3    Total Bilirubin 0.3 - 1.2 mg/dL 0.4  0.4    Alkaline Phos 38 - 126 U/L 71  91    AST 15 - 41 U/L 15  13    ALT 0 - 44 U/L 18  18      Lab Results  Component Value Date   MPROTEIN Not Observed 12/29/2022   MPROTEIN Not Observed 12/02/2022   MPROTEIN Not Observed 11/03/2022   Lab Results  Component Value Date   KPAFRELGTCHN 97.8 (H) 12/29/2022   KPAFRELGTCHN 94.2 (H) 12/02/2022   KPAFRELGTCHN 44.5 (H) 11/03/2022   LAMBDASER 15.9 12/29/2022   LAMBDASER 20.3 12/02/2022   LAMBDASER 13.5 11/03/2022   KAPLAMBRATIO 6.15 (H) 12/29/2022   KAPLAMBRATIO 4.64 (H) 12/02/2022   KAPLAMBRATIO 3.30 (H) 11/03/2022     RADIOGRAPHIC STUDIES: CT Lumbar Spine W Contrast  Result Date: 01/06/2023 CLINICAL DATA:  Hematologic malignancy, assess treatment response EXAM: CT THORACIC AND LUMBAR SPINE WITH CONTRAST TECHNIQUE: Multidetector CT imaging of the lumbar and thoracic spine was performed with intravenous contrast. Multiplanar CT image reconstructions were  also generated. RADIATION DOSE REDUCTION: This exam was performed according to the departmental dose-optimization program which includes automated exposure control, adjustment of the mA and/or kV according to patient size and/or use of iterative reconstruction technique. CONTRAST:  OMNIPAQUE IOHEXOL 300 MG/ML  SOLN COMPARISON:  CT thoracic and lumbar spine October 25, 2022. FINDINGS: CT THORACIC SPINE FINDINGS Alignment: Normal. Vertebrae: No substantial change in numerous lytic lesions throughout the thoracic spine, most conspicuous at T10. Postsurgical changes of T1-T4 fusion and posterior decompression with T2 corpectomy. Similar positioning. Subtle chronic pathologic fracture involving the inferior endplate of  T10, unchanged. No evidence of the fracture. Bridging anterior endplate osteophytes spanning from T3-T11 compatible with diffuse idiopathic skeletal hyperostosis. Paraspinal and other soft tissues: No acute findings. Disc levels: Similar degenerative change. CT LUMBAR SPINE FINDINGS Segmentation: Standard. Alignment: No substantial sagittal subluxation. Vertebrae: Vertebral heights are maintained at the lumbar spine. Numerous lytic lesions throughout the lumbar spine and visualized pelvis appears similar. Dominant lesions involve the pedicles at L1-L2, similar. No evidence of new fracture. Paraspinal and other soft tissues: No acute abnormality visualized. Aorta bi-iliac atherosclerosis. Disc levels: Similar degenerative changes. IMPRESSION: Similar a numerable lytic lesions throughout the thoracic and lumbar spine and imaged pelvis, compatible with reported history of multiple myeloma. No evidence of superimposed interval acute abnormality. Electronically Signed   By: Feliberto Harts M.D.   On: 01/06/2023 10:00   CT THORACIC SPINE W CONTRAST  Result Date: 01/06/2023 CLINICAL DATA:  Hematologic malignancy, assess treatment response EXAM: CT THORACIC AND LUMBAR SPINE WITH CONTRAST TECHNIQUE:  Multidetector CT imaging of the lumbar and thoracic spine was performed with intravenous contrast. Multiplanar CT image reconstructions were also generated. RADIATION DOSE REDUCTION: This exam was performed according to the departmental dose-optimization program which includes automated exposure control, adjustment of the mA and/or kV according to patient size and/or use of iterative reconstruction technique. CONTRAST:  OMNIPAQUE IOHEXOL 300 MG/ML  SOLN COMPARISON:  CT thoracic and lumbar spine October 25, 2022. FINDINGS: CT THORACIC SPINE FINDINGS Alignment: Normal. Vertebrae: No substantial change in numerous lytic lesions throughout the thoracic spine, most conspicuous at T10. Postsurgical changes of T1-T4 fusion and posterior decompression with T2 corpectomy. Similar positioning. Subtle chronic pathologic fracture involving the inferior endplate of T10, unchanged. No evidence of the fracture. Bridging anterior endplate osteophytes spanning from T3-T11 compatible with diffuse idiopathic skeletal hyperostosis. Paraspinal and other soft tissues: No acute findings. Disc levels: Similar degenerative change. CT LUMBAR SPINE FINDINGS Segmentation: Standard. Alignment: No substantial sagittal subluxation. Vertebrae: Vertebral heights are maintained at the lumbar spine. Numerous lytic lesions throughout the lumbar spine and visualized pelvis appears similar. Dominant lesions involve the pedicles at L1-L2, similar. No evidence of new fracture. Paraspinal and other soft tissues: No acute abnormality visualized. Aorta bi-iliac atherosclerosis. Disc levels: Similar degenerative changes. IMPRESSION: Similar a numerable lytic lesions throughout the thoracic and lumbar spine and imaged pelvis, compatible with reported history of multiple myeloma. No evidence of superimposed interval acute abnormality. Electronically Signed   By: Feliberto Harts M.D.   On: 01/06/2023 10:00    ASSESSMENT & PLAN FELIBERTO GEIGEL is a 72  y.o. male with medical history significant for IgA Kappa Multiple Myeloma who presents for a follow up visit.   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  He meets diagnostic criteria by having a serum free light chain ratio greater than 100 and lytic lesions causing pathological fracture of the humerus.  Given this I would recommend we pursued Darzalex, Revlimid, and dexamethasone.  Given his advanced age I would not recommend transplantation, that this is something we can consider when it comes time to transition to maintenance therapy versus transplant.  The patient voices understanding of the plan moving forward.  # IgA Kappa Multiple Myeloma --Metastatic survey completed 12/15/2021 --Patient underwent fixation of his humerus on 12/17/2021, pathology confirmed plasmacytoma/plasma cell neoplasm.  --Received 6 cycles of Darzalex, Revlimid, and dexamethasone started on 12/28/2021-05/18/2022.  --Due to worsening myeloma osseous lesions including pathologic fracture at  T2 with cord compression, Dr. Leonides Schanz recommends changing therapy to Velcade/Pomalyst/Dex.  Plan: --Due for Cycle 1, Day 1 of Carfilzomib/Dex today --labs from today showed white blood cell 13.0, hemoglobin 11.9, MCV 94, and platelets of 216 --Most recent myeloma labs from 12/02/2022 with SPEP and SFLC showed M protein not detectable. Kappa 97.8, Lambda 15.9, Ratio 6.15 (increasing) --Proceed with treatment today without any dose modifications.  --RTC in 2 weeks with interval weekly treatments.   #Neck pain and mid back pain: --Suspect pain is exacerbated from recent hospitalization and being in bed/sitting for long periods.  --MRI C/T/L spine obtinaed that showed unchanged lytic lesions in the right C7 inferior articular process,T6, T8, and T10  vertebral bodies, as well  as the T11 spinous process. Unchanged  multilevel cervical spondylsis,  Interval enlargement of disc herniation at L3-L4 with progressive spinal canal stenosis. No new or progressive lytic lesions.  --Encouraged to continue with physical therapy and follow up with ortho.   #Palliative Radiation Therapy: # Dysphagia --Received palliative radiation from 01/06/2022-01/19/2022 to right hymerus, right forearm and right chest/rib, 20 Gy in 10 Fx.  --recieved palliative radiation to surgical bed in the upper thoracic spine area on 07/13/2022.  27 Gy in 9 Fx.   #Lower extremity edema: --Currently on lasix and torsemide with minimal improvement. Advised to follow up with cardiologist to discuss dose or medication changes.   # Rash Concerning for Shingles-resolved -- Prescribed valacyclovir 3 times daily 1000 mg x 10 days-completed the course -- We will continue to monitor rash closely.  #DVT: --Found to have acute DVT involving left peroneal veins. Likely secondary to recent surgery and hospitalization --Currently on eliquis 5 mg BID.    #Hypokalemia: --Currently on potassium chloride 40 mEq twice a day as prescribed but takes three times a day (60 mEq) on the days he takes Metolazine.  --Potassium level is 3.1 today. Patient admit he didn't take extra dose with his Metolazine as instructed yesterday. Advised to continue with 40 mEq twice daily and increase to 60 mEq when he takes his next dose of metolazine.   #Rash on scalp and right forearm: --Recommend topical hydrocortisone cream for spot treatment  #Supportive Care -- chemotherapy education complete -- port placement not required -- zofran 8mg  q8H PRN and compazine 10mg  PO q6H for nausea -- acyclovir 400mg  PO BID for VCZ prophylaxis -- Started Zometa q 12 weeks on 11/10/2022. Next dose Oct 2024.   Orders Placed This Encounter  Procedures   Consent Attestation for Oncology Treatment    Order Specific Question:   The patient is informed of risks, benefits, side-effects of the prescribed oncology treatment.  Potential short term and long term side effects and response rates discussed. After a long discussion, the patient made informed decision to proceed.    Answer:   Yes   CBC with Differential (Cancer Center Only)    Standing Status:   Future    Number of Occurrences:   1    Standing Expiration Date:   01/12/2024   CMP (Cancer Center only)    Standing Status:   Future    Number of Occurrences:   1    Standing Expiration Date:   01/12/2024   CBC with Differential (Cancer Center Only)    Standing Status:   Future    Standing Expiration Date:   01/20/2024   CMP (Cancer Center only)    Standing Status:   Future    Standing Expiration Date:   01/20/2024  CBC with Differential (Cancer Center Only)    Standing Status:   Future    Standing Expiration Date:   01/26/2024   CMP (Cancer Center only)    Standing Status:   Future    Standing Expiration Date:   01/26/2024   Miscellaneous [facility] test (send-out)   CBC with Differential (Cancer Center Only)    Standing Status:   Future    Standing Expiration Date:   02/10/2024   CMP (Cancer Center only)    Standing Status:   Future    Standing Expiration Date:   02/10/2024   CBC with Differential (Cancer Center Only)    Standing Status:   Future    Standing Expiration Date:   02/17/2024   CMP (Cancer Center only)    Standing Status:   Future    Standing Expiration Date:   02/17/2024   CBC with Differential (Cancer Center Only)    Standing Status:   Future    Standing Expiration Date:   03/09/2024   CMP (Cancer Center only)    Standing Status:   Future    Standing Expiration Date:   03/09/2024   CBC with Differential (Cancer Center Only)    Standing Status:   Future    Standing Expiration Date:   03/16/2024   CMP (Cancer Center only)    Standing Status:   Future    Standing Expiration Date:   03/16/2024   CBC with Differential (Cancer Center Only)    Standing Status:   Future    Standing Expiration Date:   03/23/2024   CMP (Cancer Center only)     Standing Status:   Future    Standing Expiration Date:   03/23/2024   CBC with Differential (Cancer Center Only)    Standing Status:   Future    Standing Expiration Date:   04/06/2024   CMP (Cancer Center only)    Standing Status:   Future    Standing Expiration Date:   04/06/2024   CBC with Differential (Cancer Center Only)    Standing Status:   Future    Standing Expiration Date:   04/13/2024   CMP (Cancer Center only)    Standing Status:   Future    Standing Expiration Date:   04/13/2024   CBC with Differential (Cancer Center Only)    Standing Status:   Future    Standing Expiration Date:   04/20/2024   CMP (Cancer Center only)    Standing Status:   Future    Standing Expiration Date:   04/20/2024   [facility] PHYSICIAN COMMUNICATION 6    Carfilzomib - monitor patients for clinical signs or symptoms of cardiac failure or cardiac ischemia. Evaluate promptly if cardiac toxicity is suspected. Withhold carfilzomib for Grade 3 or 4 cardiac adverse reactions until recovery and consider whether to restart carfilzomib at 1 dose level reduction based on a benefit/risk assessment.   [facility] PHYSICIAN COMMUNICATION 7    Carfilzomib - In patients >= 32 years of age, the risk of cardiac failure is increased. Patients with New York Heart Association Class III and IV heart failure, recent myocardial infarction, conduction abnormalities, angina, or arrhythmias uncontrolled by medications may be at greater risk for cardiac complications. Please complete a comprehensive medical assessment (including blood pressure control and fluid management) prior to starting treatment with carfilzomib and remain under close follow-up.   PHYSICIAN COMMUNICATION ORDER    Dexamethasone 40 mg IV to be given in clinic prior to Kyprolis.   All questions  were answered. The patient knows to call the clinic with any problems, questions or concerns.  A total of more than 30 minutes were spent on this encounter with face-to-face  time and non-face-to-face time, including preparing to see the patient, ordering tests and/or medications, counseling the patient and coordination of care as outlined above.   Ulysees Barns, MD Department of Hematology/Oncology [facility] Phone: [phone] Pager: [phone] Email: [email or web address]  01/12/2023 4:19 PM

## 2023-01-13 ENCOUNTER — Telehealth: Payer: Self-pay

## 2023-01-13 LAB — MISC LABCORP TEST (SEND OUT): Labcorp test code: 143000

## 2023-01-13 NOTE — Telephone Encounter (Signed)
Travis [name] states that he is doing fine. He is eating, drinking, and urinating well. He knows to call the office at [phone] if he has any questions or concerns.

## 2023-01-13 NOTE — Telephone Encounter (Signed)
-----   Message from Nurse Ashby Dawes sent at 01/12/2023 12:41 PM EDT ----- Regarding: First time/ Kyprolis/ Dr Leonides Schanz pt Pt had first time Kyprolis today. Tolerated well.  Thanks!

## 2023-01-14 ENCOUNTER — Ambulatory Visit: Payer: Medicare Other | Admitting: Physical Therapy

## 2023-01-14 ENCOUNTER — Encounter: Payer: Self-pay | Admitting: Physical Therapy

## 2023-01-14 DIAGNOSIS — G8929 Other chronic pain: Secondary | ICD-10-CM

## 2023-01-14 DIAGNOSIS — M542 Cervicalgia: Secondary | ICD-10-CM

## 2023-01-14 DIAGNOSIS — R293 Abnormal posture: Secondary | ICD-10-CM | POA: Diagnosis not present

## 2023-01-14 DIAGNOSIS — R278 Other lack of coordination: Secondary | ICD-10-CM | POA: Diagnosis not present

## 2023-01-14 DIAGNOSIS — M25511 Pain in right shoulder: Secondary | ICD-10-CM | POA: Diagnosis not present

## 2023-01-14 DIAGNOSIS — C9000 Multiple myeloma not having achieved remission: Secondary | ICD-10-CM | POA: Diagnosis not present

## 2023-01-14 DIAGNOSIS — S22000A Wedge compression fracture of unspecified thoracic vertebra, initial encounter for closed fracture: Secondary | ICD-10-CM

## 2023-01-14 DIAGNOSIS — M6281 Muscle weakness (generalized): Secondary | ICD-10-CM | POA: Diagnosis not present

## 2023-01-14 NOTE — Therapy (Signed)
OUTPATIENT PHYSICAL THERAPY ONCOLOGY TREATMENT  Patient Name: Travis [name] MRN: [id number] DOB:[birth year], 72 y.o., male Today's Date: 01/14/2023   END OF SESSION:  PT End of Session - 01/14/23 2150     Visit Number 27    Number of Visits 37    Date for PT Re-Evaluation 02/18/23    PT Start Time 1345    PT Stop Time 1430    PT Time Calculation (min) 45 min    Activity Tolerance Patient tolerated treatment well    Behavior During Therapy [facility] for tasks assessed/performed                      Past Medical History:  Diagnosis Date   A-fib [facility] [place])    Arthritis    "comes and goes; mostly in hands, occasionally elbow" (04/05/2017)   Complication of anesthesia    "just wanted to sleep alot  for couple days S/P VARICOCELE OR" (04/05/2017)   DVT (deep venous thrombosis) (HCC)    LLE   Dysrhythmia    PVCs    GERD (gastroesophageal reflux disease)    History of hiatal hernia    History of radiation therapy    Thoracic Spine- 07/13/22-07/23/22-Dr. Antony Blackbird   Hypertension    Impaired glucose tolerance    Multiple myeloma (HCC)    Obesity (BMI 30-39.9) 07/26/2016   OSA on CPAP 07/26/2016   Mild with AHI 12.5/hr now on CPAP at 14cm H2O   PAF (paroxysmal atrial fibrillation) (HCC)    s/p PVI Ablation in 2018 // Flecainide Rx // Echo 8/21: EF 60-65, no RWMA, mild LVH, normal RV SF, moderate RVE, mild BAE, trivial MR, mild dilation of aortic root (40 mm)   Pneumonia    "may have had walking pneumonia a few years ago" (04/05/2017)   Pre-diabetes    Thrombophlebitis    Past Surgical History:  Procedure Laterality Date   ATRIAL FIBRILLATION ABLATION  04/05/2017   ATRIAL FIBRILLATION ABLATION N/A 04/05/2017   Procedure: ATRIAL FIBRILLATION ABLATION;  Surgeon: [name], MD;  Location: [facility];  Service: Cardiovascular;  Laterality: N/A;   BONE BIOPSY Right 12/17/2021   Procedure: BONE BIOPSY;  Surgeon: [name], MD;  Location: [facility];  Service: Orthopedics;  Laterality: Right;   COMPLETE RIGHT HIP REPLACMENT  01/19/2019   EVALUATION UNDER ANESTHESIA WITH HEMORRHOIDECTOMY N/A 02/24/2017   Procedure: EXAM UNDER ANESTHESIA WITH  HEMORRHOIDECTOMY;  Surgeon: [name], MD;  Location: [facility];  Service: General;  Laterality: N/A;   EXCISIONAL TOTAL HIP ARTHROPLASTY WITH ANTIBIOTIC SPACERS Right 09/25/2020   Procedure: EXCISIONAL TOTAL HIP ARTHROPLASTY WITH ANTIBIOTIC SPACERS;  Surgeon: [name], MD;  Location: [facility];  Service: Orthopedics;  Laterality: Right;   FLEXIBLE SIGMOIDOSCOPY N/A 04/15/2015   Procedure:  UNSEDATED FLEXIBLE SIGMOIDOSCOPY;  Surgeon: [name], MD;  Location: [facility];  Service: Endoscopy;  Laterality: N/A;   HUMERUS IM NAIL Right 12/17/2021   Procedure: INTRAMEDULLARY (IM) NAIL HUMERAL;  Surgeon: [name], MD;  Location: [facility];  Service: Orthopedics;  Laterality: Right;   KNEE ARTHROSCOPY Left ~ 2016   POSTERIOR CERVICAL FUSION/FORAMINOTOMY N/A 06/04/2022   Procedure: Thoracic one - Thoracic Four Posterior lateral arthrodesis/ fusion and Thoracic two Corpectomy;  Surgeon: [name], MD;  Location: [facility];  Service: Neurosurgery;  Laterality: N/A;   REIMPLANTATION OF TOTAL HIP Right 12/25/2020   Procedure: REIMPLANTATION/REVISION OF RIGHT TOTAL HIP VERSUS REPEAT IRRIGATION AND DEBRIDEMENT;  Surgeon: [name], MD;  Location: [facility];  Service: Orthopedics;  Laterality: Right;   TESTICLE SURGERY  1988   VARICOCELE   TONSILLECTOMY     VARICOSE VEIN SURGERY Bilateral    Patient Active Problem List   Diagnosis Date Noted   Pancreatic cyst 09/30/2022   Infection due to novel influenza A virus 09/29/2022   Persistent atrial fibrillation (HCC) 07/15/2022   Acute on chronic diastolic heart failure (HCC) 07/03/2022   Pulmonary infiltrates 07/01/2022   Acute respiratory failure with hypoxia (HCC) 07/01/2022   Hyponatremia 06/29/2022   History of thoracic  surgery 06/29/2022   Acute DVT of left tibial vein (HCC) 06/21/2022   Hypokalemia 06/21/2022   Vitamin D deficiency 05/30/2022   Other constipation 05/29/2022   Pathologic compression fracture of spine, initial encounter (HCC) 05/29/2022   Cord compression (HCC) 05/28/2022   Degenerative cervical disc 05/14/2022   Hypercoagulable state due to persistent atrial fibrillation (HCC) 01/11/2022   Multiple myeloma (HCC) 12/10/2021   Medication monitoring encounter 02/27/2021   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   Status post peripherally inserted central catheter (PICC) central line placement 11/06/2020   Drug rash with eosinophilia and systemic symptoms syndrome 11/06/2020   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   PAF (paroxysmal atrial fibrillation) (HCC)    GERD (gastroesophageal reflux disease) 03/29/2018   Hiatal hernia 03/29/2018   History of DVT (deep vein thrombosis) 03/29/2018   CAP (community acquired pneumonia) 03/29/2018   Osteoarthritis 03/29/2018   Neuropathy 08/29/2017   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   Paroxysmal atrial fibrillation with rapid ventricular response (HCC) 04/05/2017   Prolapsed internal hemorrhoids, grade 4, s/p ligation/pexy/hemorrhoidectomy x 2 02/24/2017 02/24/2017   OSA (obstructive sleep apnea) 07/26/2016   Obesity (BMI 30-39.9) 07/26/2016   Atrial fibrillation with RVR (HCC)    Essential hypertension    Chest pain at rest     PCP: Eleanora Neighbor, MD  REFERRING PROVIDER: Antony Blackbird, MD  REFERRING DIAG: Multiple Myeloma  THERAPY DIAG:  Compression fracture of thoracic vertebra, unspecified thoracic vertebral level, initial encounter (HCC)  Multiple myeloma not having achieved remission (HCC)  Muscle weakness (generalized)  Neck pain  Abnormal posture  Other lack of coordination  Chronic right shoulder pain  ONSET DATE: 2023  Rationale for Evaluation and Treatment: Rehabilitation  SUBJECTIVE:                                                                                                                                                                                            SUBJECTIVE STATEMENT: Not to bad today. Started new chemo drug last week. Neck  feeling ok. [name]  PERTINENT HISTORY:  AVREY TANAKA 72 y.o. male with medical history significant for IgA Kappa Multiple Myeloma. He was diagnosed with smouldering myeloma in 2018 . Last July he developed right arm pain due to a pathologic fx and he underwent radiation and fixation. In early Feb 2024 he had radiation for pathologic T2 compression fx followed by a T1-T4 fusion. PAIN:  Are you having pain?  PAIN:  Are you having pain? Yes NPRS scale:  3/10 neck/upper back, 0/10 LB presently Pain location: Left greater than right LB Pain orientation: Left  PAIN TYPE: aching Pain description: constant  Aggravating factors: standing,rising from chair Relieving factors: recliner, lying down,  PRECAUTIONS: Multiple Myeloma with mets to R ribs, R humerus with ORIF for pathological fracture 12/17/21. Lesions have also been found in R rib and also in his skull. Pathologic fx T2 with fusion T1-4 on 06/04/22, Bilateral LE edema (on Lasix), Afib   WEIGHT BEARING RESTRICTIONS: No  FALLS:  Has patient fallen in last 6 months? Yes. Number of falls 1  LIVING ENVIRONMENT: Lives with: lives with their family and lives with their spouse Lives in: House/apartment Stairs: Yes; Internal: 14 steps; on left going up and External: 3 steps; on right going up Has following equipment at home: Single point cane and Walker - 4 wheeled  OCCUPATION: attorney/ mostly retired?  LEISURE: golf (can't do now.), use to play racquetball  HAND DOMINANCE: right   PRIOR LEVEL OF FUNCTION: Independent  POSTURE:significant slump sitting posture with forward head  PATIENT GOALS: decrease pain,get stronger,improve balance   OBJECTIVE:  COGNITION: Overall  cognitive status: Within functional limits for tasks assessed   PALPATION: Very tender bilateral UT/levator, scapular regions  OBSERVATIONS / OTHER ASSESSMENTS: very flexed head and neck with chin nearly at chest  SENSATION: Light touch: Appears intact    POSTURE: significant forward head, round shoulders, increased kyphosis   Gait holding single point cane in left hand but not using. Right shoulder low, flexed neck;difficulty raising head to see where he is going  UPPER EXTREMITY AROM/PROM:  A/PROM RIGHT   eval  RIGHT 11/08/2022 RIGHT 12/20/2022  Shoulder extension     Shoulder flexion 91 with compensation 101 with compensation  101 with compensation  Shoulder abduction 67 with compensation 75 with compensation 75 compensation  Shoulder internal rotation     Shoulder external rotation C6      (Blank rows = not tested)  A/PROM LEFT   eval  Shoulder extension   Shoulder flexion 123  Shoulder abduction 112  Shoulder internal rotation T7  Shoulder external rotation t3    (Blank rows = not tested)  CERVICAL AROM: All within normal limits:    Percent limited 11/08/22 12/20/2022  Flexion Fully flexed    Extension From flexed position decreased 80% Decreased 70% Decreased 40%  Right lateral flexion     Left lateral flexion     Right rotation Dec 50% sitting in FH posture  Dec 50% from forward head posture  Left rotation        Dec 50%  Decreased 50% from forward head posture    UPPER EXTREMITY STRENGTH:    LOWER EXTREMITY AROM/PROM:  A/PROM Right eval  Hip flexion   Hip extension   Hip abduction   Hip adduction   Hip internal rotation   Hip external rotation   Knee flexion   Knee extension   Ankle dorsiflexion   Ankle plantarflexion   Ankle inversion   Ankle eversion    (  Blank rows = not tested)  A/PROM LEFT eval  Hip flexion   Hip extension   Hip abduction   Hip adduction   Hip internal rotation   Hip external rotation   Knee flexion   Knee  extension   Ankle dorsiflexion   Ankle plantarflexion   Ankle inversion   Ankle eversion    (Blank rows = not tested)  LOWER EXTREMITY MMT:    10/04/2022  4 position balance test; able to hold DLS feet together , and half tandem stance 10 seconds. Unable to maintain full tandem stance for 10 sec, unable to hold SLS greater than a second or 2 on left, and 4 seconds on right.     LYMPHEDEMA ASSESSMENTS:   SURGERY TYPE/DATE: 8/17/23Left prox humerus fixation for pathologic fx  2/2/2024Fusion T1-T4 for pathologic fx with cord compression 06/04/22 posterior cervical fusion/foraminotomy;T1-4 fusion NUMBER OF LYMPH NODES REMOVED: NA  CHEMOTHERAPY: Velcade/promalyst/Dex;  RADIATION:to prox humerus and thoracic spine pathologic fx  HORMONE TREATMENT: NA  INFECTIONS: NA  FUNCTIONAL TESTS:  10/09/2022;30 second sit to stand 10 with heavy use of arms  GAIT: Distance walked: 20 Assistive device utilized: carried SPC in left Level of assistance: Complete Independence Comments: feels unsteady, very flexed neck posture   QUICK DASH SURVEY:    TODAY'S TREATMENT:                                                                                                                                         DATE:   01/14/23;Pt seen for aquatic therapy today.  Treatment took place in water 3.5-4.75 ft in depth at the [facility] pool. Temp of water was 91.  Pt entered/exited the pool via stairs with bilat rail independently. Pt requires buoyancy of water for support and to offload joints with strengthening exercises.Pt utilizes viscosity of the water required for strengthening.   Seated water bench with 75% submersion Pt performed seated LE AROM exercises 20x in all planes, with concurrent discussion of his current health status and pain assessment. 75% depth water walking with single buoy UE wts for push/pull 10 lengths. Hip 3 way kicks 15x Bil holding onto wall for balance with VC to push water  a bit faster and adding ankle fins for more resistance. Small marching across pool holding single buoy floats under water 4 lengths f/f tandem walking holding floats 4x across pool.  Single leg press with small noodle 2x10 Bil with balance assist. Horizontal float with concurrent skin rolling on upper back and lateral sway for trunk stretching by PTA.    01/10/2023 Nu step seat 12, UE 8, Level 5 x 9 min, 727 steps Tandem stance x 2 B to failure,       SLS 4 times ea side to failure Mini squats x 10 Ax beam 8 lengths forward, 6 lengths sideways with intermittent hand touch Wall slides Right shoulder flexion x 5  Sit to stand x 10, 02 92%,  HR 67, recovered quickly to 96% Walked in clinic with SPC and CGA  360 ft, 02 92%, HR 71, recovered quickly with seated rest to 96%  PATIENT EDUCATION:  Education details: sitting posture with lumbar roll Person educated: Patient Education method: Medical illustrator Education comprehension: verbalized understanding and returned demonstration  HOME EXERCISE PROGRAM: Doing HEP from HHPT; cervical ROM, shoulder rolls, shrugs, scap retraction, UT stretches  Aquatic Access Code: [email or web address] URL: [email or web address] Date: 11/09/2022 Prepared by: [facility] This aquatic home exercise program from [employer] utilizes pictures from land based exercises, but has been adapted prior to lamination and issuance.    ASSESSMENT:  CLINICAL IMPRESSION: Pt really able to pick up his pace during todays aquatic session. Balance was consistently good as was his control of all exercises. Pt would like to resume aquatic sessions when he returns from his [place] trip or perhaps around January after the holidays.  OBJECTIVE IMPAIRMENTS: decreased activity tolerance, decreased balance, difficulty walking, decreased ROM, decreased strength, increased edema, impaired UE functional use, postural dysfunction, and pain.    ACTIVITY LIMITATIONS: carrying, lifting, dressing, reach over head, and locomotion level  PARTICIPATION LIMITATIONS: cleaning, laundry, driving, and occupation  PERSONAL FACTORS:  Multiple Myeloma  are also affecting patient's functional outcome.   REHAB POTENTIAL: Good  CLINICAL DECISION MAKING: Evolving/moderate complexity  EVALUATION COMPLEXITY: Moderate  GOALS: Goals reviewed with patient? Yes  SHORT TERM GOALS: Target date: 10/21/22  Pt will be educated in and independent in proper sitting posture using lumbar roll prn Baseline: Goal status; MET 11/08/22 2.  Pt will report decreased muscle soreness in bilateral upper back x 25% Baseline:  Goal status: ONGOING  3.  Pt will be compliant with a HEP for postural strengthening/ROM Baseline:  Goal status: MET / LONG TERM GOALS: Target date: 12/20/22  Pt will be able to bring his head to neutral position for safety with ambulation/driving Baseline:  Goal status:Ongoing( but improved) 2.  Pt will have decreased upper back pain by 50% or more Baseline:  Goal status:Ongoing 3.  Pt will be able to flex right shoulder to 115 degrees with minimal compensation for improved reaching ability Baseline:  Goal status: ONGOING 4; Pt will be able to maintain tandem stance on both sides x 10 sec to demonstrated improved balance. Goal status:MET right foot forward 11/08/2022, MET both legs 12/20/2022  5. Pt will be able to perform sit to stand x 5 without use of Ue's  Baseline:must use hands  Goal status:MET 12/20/2022 but difficult 6.  Pt will be able to maintain SLS on ea leg x 10 seconds without LOB  GOAL: ONGOING  PT FREQUENCY: 2x/week  PT DURATION: 6 weeks  PLANNED INTERVENTIONS: Therapeutic exercises, Therapeutic activity, Neuromuscular re-education, Balance training, Gait training, Patient/Family education, Self Care, Joint mobilization, Aquatic Therapy, Manual therapy, and Re-evaluation  PLAN FOR NEXT SESSION: Cont land and  aquatic exercises working on posture, strength, balance, hold STM secondary to no longer having lasting benefit  Ane Payment, PTA 01/14/23 9:51 PM

## 2023-01-16 NOTE — Progress Notes (Unsigned)
a   Cardiology Office Note   Date:  01/17/2023   ID:  Travis, [name] 08-15-1950, MRN [id number]  PCP:  [name], MD  Cardiologist:   [name], MD   Pt presents fore evaluation of edema   History of Present Illness: Travis [name] is a 72 y.o. male with a history of  HTN, OSA and PAF (CHADSVASc score 4; s/p Afib ablation 2018 ).   The pt had Cardiac CTA in 2018 which showed no CAD  Calcium score was 0  Pt also has hx of DVT, peripheral neuropathy and Multiple Myeloma   Fall 2023 he was hospitalized in [facility] for afib / flutter  Volume overloaded at time    Diuresed   Converted to SR  B blocker stopped due to bradycardia then resumed          I saw the pt in Dec 2023  Back in afib   Flecanide stopped and amiodarone started      BP was high  Hydrodiuril stopped due to low K   Recomm hydralazine 10 as tid Increased to 20mg   tid     Seen by [name] in Jan 2024  Recomm decreasing amiodarone dose to 200 daily    Increase hydralazine to 37.5 tid    \  The pt was hospitalized in march 2024 for pneumonia   IN afib  Treated with IV amiodarone then transitioned to po       I saw the pt in April 2024   Since then he has been seen by [name] and [name]  The pt presents today with increased LE edema and SOB      Says he does better with urinating when he is in afib   He has been in it intermittently    I saw the pt in Aug 2024     Allergies:   Linezolid and Vancomycin   Past Medical History:  Diagnosis Date   A-fib [facility])    Arthritis    "comes and goes; mostly in hands, occasionally elbow" (04/05/2017)   Complication of anesthesia    "just wanted to sleep alot  for couple days S/P VARICOCELE OR" (04/05/2017)   DVT (deep venous thrombosis) (HCC)    LLE   Dysrhythmia    PVCs    GERD (gastroesophageal reflux disease)    History of hiatal hernia    History of radiation therapy    Thoracic Spine- 07/13/22-07/23/22-Dr. Antony Blackbird   Hypertension    Impaired glucose  tolerance    Multiple myeloma (HCC)    Obesity (BMI 30-39.9) 07/26/2016   OSA on CPAP 07/26/2016   Mild with AHI 12.5/hr now on CPAP at 14cm H2O   PAF (paroxysmal atrial fibrillation) (HCC)    s/p PVI Ablation in 2018 // Flecainide Rx // Echo 8/21: EF 60-65, no RWMA, mild LVH, normal RV SF, moderate RVE, mild BAE, trivial MR, mild dilation of aortic root (40 mm)   Pneumonia    "may have had walking pneumonia a few years ago" (04/05/2017)   Pre-diabetes    Thrombophlebitis     Past Surgical History:  Procedure Laterality Date   ATRIAL FIBRILLATION ABLATION  04/05/2017   ATRIAL FIBRILLATION ABLATION N/A 04/05/2017   Procedure: ATRIAL FIBRILLATION ABLATION;  Surgeon: [name], MD;  Location: [facility];  Service: Cardiovascular;  Laterality: N/A;   BONE BIOPSY Right 12/17/2021   Procedure: BONE BIOPSY;  Surgeon: [name], MD;  Location: [facility];  Service: Orthopedics;  Laterality: Right;   COMPLETE RIGHT HIP REPLACMENT  01/19/2019   EVALUATION UNDER ANESTHESIA WITH HEMORRHOIDECTOMY N/A 02/24/2017   Procedure: EXAM UNDER ANESTHESIA WITH  HEMORRHOIDECTOMY;  Surgeon: [name], MD;  Location: [facility];  Service: General;  Laterality: N/A;   EXCISIONAL TOTAL HIP ARTHROPLASTY WITH ANTIBIOTIC SPACERS Right 09/25/2020   Procedure: EXCISIONAL TOTAL HIP ARTHROPLASTY WITH ANTIBIOTIC SPACERS;  Surgeon: [name], MD;  Location: [facility];  Service: Orthopedics;  Laterality: Right;   FLEXIBLE SIGMOIDOSCOPY N/A 04/15/2015   Procedure:  UNSEDATED FLEXIBLE SIGMOIDOSCOPY;  Surgeon: [name], MD;  Location: [facility];  Service: Endoscopy;  Laterality: N/A;   HUMERUS IM NAIL Right 12/17/2021   Procedure: INTRAMEDULLARY (IM) NAIL HUMERAL;  Surgeon: [name], MD;  Location: [facility];  Service: Orthopedics;  Laterality: Right;   KNEE ARTHROSCOPY Left ~ 2016   POSTERIOR CERVICAL FUSION/FORAMINOTOMY N/A 06/04/2022   Procedure: Thoracic one -  Thoracic Four Posterior lateral arthrodesis/ fusion and Thoracic two Corpectomy;  Surgeon: [name], MD;  Location: [facility];  Service: Neurosurgery;  Laterality: N/A;   REIMPLANTATION OF TOTAL HIP Right 12/25/2020   Procedure: REIMPLANTATION/REVISION OF RIGHT TOTAL HIP VERSUS REPEAT IRRIGATION AND DEBRIDEMENT;  Surgeon: [name], MD;  Location: [facility];  Service: Orthopedics;  Laterality: Right;   TESTICLE SURGERY  1988   VARICOCELE   TONSILLECTOMY     VARICOSE VEIN SURGERY Bilateral      Social History:  The patient  reports that he has never smoked. He has never used smokeless tobacco. He reports current alcohol use of about 2.0 standard drinks of alcohol per week. He reports that he does not use drugs.   Family History:  The patient's family history includes Atrial fibrillation in his father; Dementia in his maternal grandmother and mother; Hypertension in his father and paternal grandfather; Stroke in his father.    ROS:  Please see the history of present illness. All other systems are reviewed and  Negative to the above problem except as noted.    PHYSICAL EXAM: VS:  BP 126/60   Pulse (!) 57   Ht 5\' 10"  (1.778 m)   Wt 258 lb (117 kg)   SpO2 97%   BMI 37.02 kg/m   Pt examined in chair GEN: Obese 72 yo in no acute distress  HEENT: normal  Neck:  JVP is full  NO obvious JVD Cardiac: RRR; no murmur   1-2+ LE edema    Respiratory:  clear to auscultation  GI: Supple   Nontender  No hepatomegaly   EKG:  EKG is not ordered today   Lipid Panel No results found for: "CHOL", "TRIG", "HDL", "CHOLHDL", "VLDL", "LDLCALC", "LDLDIRECT"    Wt Readings from Last 3 Encounters:  01/17/23 258 lb (117 kg)  01/12/23 255 lb 8 oz (115.9 kg)  01/05/23 257 lb 8 oz (116.8 kg)      ASSESSMENT AND PLAN:   Edema   WIll increase torsemide to 40 mg     Add Zaroxyln every other day Check BMET in 1 week    2  PAF  Still with some episodes of afib  CLinically in SR today.  Keep on  amiodarone and Eliquis    3  HTN  BP is high  Follow as diurese   4  Hx of Bradycardia  R in 50s     5.  Lipids.  Excellent   LDL 61  HDL 46  6.  OSA.   Continue using CPAP   Current medicines are reviewed at length with the patient today.  The patient does not have concerns regarding medicines.  Signed, [name], MD  01/17/2023 11:23 AM    [facility] [address] [place], [place], [place]  [address] Phone: [phone]; Fax: [phone]

## 2023-01-17 ENCOUNTER — Ambulatory Visit: Payer: Medicare Other | Attending: Internal Medicine | Admitting: Internal Medicine

## 2023-01-17 ENCOUNTER — Encounter: Payer: Self-pay | Admitting: Internal Medicine

## 2023-01-17 ENCOUNTER — Ambulatory Visit: Payer: Medicare Other

## 2023-01-17 VITALS — BP 126/60 | HR 57 | Ht 70.0 in | Wt 258.0 lb

## 2023-01-17 DIAGNOSIS — R293 Abnormal posture: Secondary | ICD-10-CM

## 2023-01-17 DIAGNOSIS — G8929 Other chronic pain: Secondary | ICD-10-CM

## 2023-01-17 DIAGNOSIS — M542 Cervicalgia: Secondary | ICD-10-CM | POA: Diagnosis not present

## 2023-01-17 DIAGNOSIS — M25511 Pain in right shoulder: Secondary | ICD-10-CM | POA: Diagnosis not present

## 2023-01-17 DIAGNOSIS — R278 Other lack of coordination: Secondary | ICD-10-CM | POA: Diagnosis not present

## 2023-01-17 DIAGNOSIS — I5030 Unspecified diastolic (congestive) heart failure: Secondary | ICD-10-CM | POA: Diagnosis not present

## 2023-01-17 DIAGNOSIS — M6281 Muscle weakness (generalized): Secondary | ICD-10-CM

## 2023-01-17 DIAGNOSIS — S22000A Wedge compression fracture of unspecified thoracic vertebra, initial encounter for closed fracture: Secondary | ICD-10-CM

## 2023-01-17 DIAGNOSIS — C9000 Multiple myeloma not having achieved remission: Secondary | ICD-10-CM

## 2023-01-17 MED ORDER — SPIRONOLACTONE 25 MG PO TABS: 25.0000 mg | ORAL_TABLET | Freq: Every day | ORAL | 3 refills | Status: DC

## 2023-01-17 NOTE — Patient Instructions (Signed)
Medication Instructions:  Your physician has recommended you make the following change in your medication:   1) START spironolactone 25 mg daily (start on 01/18/23)  *If you need a refill on your cardiac medications before your next appointment, please call your pharmacy*  Lab Work: Labs at cancer center on Wednesdays: BMET If you have labs (blood work) drawn today and your tests are completely normal, you will receive your results only by: [employer] (if you have MyChart) OR A paper copy in the mail If you have any lab test that is abnormal or we need to change your treatment, we will call you to review the results.  Testing/Procedures: None ordered today.  Follow-Up: At [facility], you and your health needs are our priority.  As part of our continuing mission to provide you with exceptional heart care, we have created designated Provider Care Teams.  These Care Teams include your primary Cardiologist (physician) and Advanced Practice Providers (APPs -  Physician Assistants and Nurse Practitioners) who all work together to provide you with the care you need, when you need it.  Your next appointment:   3 month(s)  The format for your next appointment:   In Person  Provider:   [name], MD

## 2023-01-17 NOTE — Therapy (Signed)
OUTPATIENT PHYSICAL THERAPY ONCOLOGY TREATMENT  Patient Name: Travis [name] MRN: [id number] DOB:02-Nov-1950, 72 y.o., male Today's Date: 01/17/2023   END OF SESSION:  PT End of Session - 01/17/23 1501     Visit Number 28    Number of Visits 37    Date for PT Re-Evaluation 02/18/23    PT Start Time 1503    PT Stop Time 1555    PT Time Calculation (min) 52 min    Equipment Utilized During Treatment Gait belt    Activity Tolerance Patient tolerated treatment well    Behavior During Therapy WFL for tasks assessed/performed                      Past Medical History:  Diagnosis Date   A-fib (HCC)    Arthritis    "comes and goes; mostly in hands, occasionally elbow" (04/05/2017)   Complication of anesthesia    "just wanted to sleep alot  for couple days S/P VARICOCELE OR" (04/05/2017)   DVT (deep venous thrombosis) (HCC)    LLE   Dysrhythmia    PVCs    GERD (gastroesophageal reflux disease)    History of hiatal hernia    History of radiation therapy    Thoracic Spine- 07/13/22-07/23/22-Dr. Antony Blackbird   Hypertension    Impaired glucose tolerance    Multiple myeloma (HCC)    Obesity (BMI 30-39.9) 07/26/2016   OSA on CPAP 07/26/2016   Mild with AHI 12.5/hr now on CPAP at 14cm H2O   PAF (paroxysmal atrial fibrillation) (HCC)    s/p PVI Ablation in 2018 // Flecainide Rx // Echo 8/21: EF 60-65, no RWMA, mild LVH, normal RV SF, moderate RVE, mild BAE, trivial MR, mild dilation of aortic root (40 mm)   Pneumonia    "may have had walking pneumonia a few years ago" (04/05/2017)   Pre-diabetes    Thrombophlebitis    Past Surgical History:  Procedure Laterality Date   ATRIAL FIBRILLATION ABLATION  04/05/2017   ATRIAL FIBRILLATION ABLATION N/A 04/05/2017   Procedure: ATRIAL FIBRILLATION ABLATION;  Surgeon: [name], MD;  Location: [facility];  Service: Cardiovascular;  Laterality: N/A;   BONE BIOPSY Right 12/17/2021   Procedure: BONE BIOPSY;  Surgeon:  [name], MD;  Location: [facility];  Service: Orthopedics;  Laterality: Right;   COMPLETE RIGHT HIP REPLACMENT  01/19/2019   EVALUATION UNDER ANESTHESIA WITH HEMORRHOIDECTOMY N/A 02/24/2017   Procedure: EXAM UNDER ANESTHESIA WITH  HEMORRHOIDECTOMY;  Surgeon: [name], MD;  Location: [facility];  Service: General;  Laterality: N/A;   EXCISIONAL TOTAL HIP ARTHROPLASTY WITH ANTIBIOTIC SPACERS Right 09/25/2020   Procedure: EXCISIONAL TOTAL HIP ARTHROPLASTY WITH ANTIBIOTIC SPACERS;  Surgeon: [name], MD;  Location: [facility];  Service: Orthopedics;  Laterality: Right;   FLEXIBLE SIGMOIDOSCOPY N/A 04/15/2015   Procedure:  UNSEDATED FLEXIBLE SIGMOIDOSCOPY;  Surgeon: [name], MD;  Location: [facility];  Service: Endoscopy;  Laterality: N/A;   HUMERUS IM NAIL Right 12/17/2021   Procedure: INTRAMEDULLARY (IM) NAIL HUMERAL;  Surgeon: [name], MD;  Location:  SURGERY CENTER;  Service: Orthopedics;  Laterality: Right;   KNEE ARTHROSCOPY Left ~ 2016   POSTERIOR CERVICAL FUSION/FORAMINOTOMY N/A 06/04/2022   Procedure: Thoracic one - Thoracic Four Posterior lateral arthrodesis/ fusion and Thoracic two Corpectomy;  Surgeon: [name], MD;  Location: [facility];  Service: Neurosurgery;  Laterality: N/A;   REIMPLANTATION OF TOTAL HIP Right 12/25/2020   Procedure: REIMPLANTATION/REVISION OF  RIGHT TOTAL HIP VERSUS REPEAT IRRIGATION AND DEBRIDEMENT;  Surgeon: [name], MD;  Location: [facility];  Service: Orthopedics;  Laterality: Right;   TESTICLE SURGERY  1988   VARICOCELE   TONSILLECTOMY     VARICOSE VEIN SURGERY Bilateral    Patient Active Problem List   Diagnosis Date Noted   Pancreatic cyst 09/30/2022   Infection due to novel influenza A virus 09/29/2022   Persistent atrial fibrillation (HCC) 07/15/2022   Acute on chronic diastolic heart failure (HCC) 07/03/2022   Pulmonary infiltrates 07/01/2022   Acute respiratory failure with hypoxia (HCC) 07/01/2022    Hyponatremia 06/29/2022   History of thoracic surgery 06/29/2022   Acute DVT of left tibial vein (HCC) 06/21/2022   Hypokalemia 06/21/2022   Vitamin D deficiency 05/30/2022   Other constipation 05/29/2022   Pathologic compression fracture of spine, initial encounter (HCC) 05/29/2022   Cord compression (HCC) 05/28/2022   Degenerative cervical disc 05/14/2022   Hypercoagulable state due to persistent atrial fibrillation (HCC) 01/11/2022   Multiple myeloma (HCC) 12/10/2021   Medication monitoring encounter 02/27/2021   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   Status post peripherally inserted central catheter (PICC) central line placement 11/06/2020   Drug rash with eosinophilia and systemic symptoms syndrome 11/06/2020   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   PAF (paroxysmal atrial fibrillation) (HCC)    GERD (gastroesophageal reflux disease) 03/29/2018   Hiatal hernia 03/29/2018   History of DVT (deep vein thrombosis) 03/29/2018   CAP (community acquired pneumonia) 03/29/2018   Osteoarthritis 03/29/2018   Neuropathy 08/29/2017   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   Paroxysmal atrial fibrillation with rapid ventricular response (HCC) 04/05/2017   Prolapsed internal hemorrhoids, grade 4, s/p ligation/pexy/hemorrhoidectomy x 2 02/24/2017 02/24/2017   OSA (obstructive sleep apnea) 07/26/2016   Obesity (BMI 30-39.9) 07/26/2016   Atrial fibrillation with RVR (HCC)    Essential hypertension    Chest pain at rest     PCP: Eleanora Neighbor, MD  REFERRING PROVIDER: Antony Blackbird, MD  REFERRING DIAG: Multiple Myeloma  THERAPY DIAG:  Compression fracture of thoracic vertebra, unspecified thoracic vertebral level, initial encounter (HCC)  Multiple myeloma not having achieved remission (HCC)  Muscle weakness (generalized)  Neck pain  Abnormal posture  Other lack of coordination  Chronic right shoulder pain  ONSET DATE: 2023  Rationale for  Evaluation and Treatment: Rehabilitation  SUBJECTIVE:                                                                                                                                                                                           SUBJECTIVE STATEMENT:  I  have seen significant improvement over the last few days. I was able to walk a mile on Monday with my 2 canes, Today I walked 2/3 of a mile with 1 cane. My neck and back is doing a little better.  Saw the cardiologist today and that is going well too. Go to [place] on Friday to see my grand kids through next Tuesday. PERTINENT HISTORY:  Travis [name] 72 y.o. male with medical history significant for IgA Kappa Multiple Myeloma. He was diagnosed with smouldering myeloma in 2018 . Last July he developed right arm pain due to a pathologic fx and he underwent radiation and fixation. In early Feb 2024 he had radiation for pathologic T2 compression fx followed by a T1-T4 fusion. PAIN:  Are you having pain?  PAIN:  Are you having pain? Yes NPRS scale:  2/10 neck/upper back, 0/10 LB presently Pain location: Left greater than right LB Pain orientation: Left  PAIN TYPE: aching Pain description: constant  Aggravating factors: standing,rising from chair Relieving factors: recliner, lying down,  PRECAUTIONS: Multiple Myeloma with mets to R ribs, R humerus with ORIF for pathological fracture 12/17/21. Lesions have also been found in R rib and also in his skull. Pathologic fx T2 with fusion T1-4 on 06/04/22, Bilateral LE edema (on Lasix), Afib   WEIGHT BEARING RESTRICTIONS: No  FALLS:  Has patient fallen in last 6 months? Yes. Number of falls 1  LIVING ENVIRONMENT: Lives with: lives with their family and lives with their spouse Lives in: House/apartment Stairs: Yes; Internal: 14 steps; on left going up and External: 3 steps; on right going up Has following equipment at home: Single point cane and Walker - 4 wheeled  OCCUPATION: attorney/ mostly  retired?  LEISURE: golf (can't do now.), use to play racquetball  HAND DOMINANCE: right   PRIOR LEVEL OF FUNCTION: Independent  POSTURE:significant slump sitting posture with forward head  PATIENT GOALS: decrease pain,get stronger,improve balance   OBJECTIVE:  COGNITION: Overall cognitive status: Within functional limits for tasks assessed   PALPATION: Very tender bilateral UT/levator, scapular regions  OBSERVATIONS / OTHER ASSESSMENTS: very flexed head and neck with chin nearly at chest  SENSATION: Light touch: Appears intact    POSTURE: significant forward head, round shoulders, increased kyphosis   Gait holding single point cane in left hand but not using. Right shoulder low, flexed neck;difficulty raising head to see where he is going  UPPER EXTREMITY AROM/PROM:  A/PROM RIGHT   eval  RIGHT 11/08/2022 RIGHT 12/20/2022  Shoulder extension     Shoulder flexion 91 with compensation 101 with compensation  101 with compensation  Shoulder abduction 67 with compensation 75 with compensation 75 compensation  Shoulder internal rotation     Shoulder external rotation C6      (Blank rows = not tested)  A/PROM LEFT   eval  Shoulder extension   Shoulder flexion 123  Shoulder abduction 112  Shoulder internal rotation T7  Shoulder external rotation t3    (Blank rows = not tested)  CERVICAL AROM: All within normal limits:    Percent limited 11/08/22 12/20/2022  Flexion Fully flexed    Extension From flexed position decreased 80% Decreased 70% Decreased 40%  Right lateral flexion     Left lateral flexion     Right rotation Dec 50% sitting in FH posture  Dec 50% from forward head posture  Left rotation        Dec 50%  Decreased 50% from forward head posture    UPPER  EXTREMITY STRENGTH:    LOWER EXTREMITY AROM/PROM:  A/PROM Right eval  Hip flexion   Hip extension   Hip abduction   Hip adduction   Hip internal rotation   Hip external rotation   Knee flexion    Knee extension   Ankle dorsiflexion   Ankle plantarflexion   Ankle inversion   Ankle eversion    (Blank rows = not tested)  A/PROM LEFT eval  Hip flexion   Hip extension   Hip abduction   Hip adduction   Hip internal rotation   Hip external rotation   Knee flexion   Knee extension   Ankle dorsiflexion   Ankle plantarflexion   Ankle inversion   Ankle eversion    (Blank rows = not tested)  LOWER EXTREMITY MMT:    10/04/2022  4 position balance test; able to hold DLS feet together , and half tandem stance 10 seconds. Unable to maintain full tandem stance for 10 sec, unable to hold SLS greater than a second or 2 on left, and 4 seconds on right.     LYMPHEDEMA ASSESSMENTS:   SURGERY TYPE/DATE: 8/17/23Left prox humerus fixation for pathologic fx  2/2/2024Fusion T1-T4 for pathologic fx with cord compression 06/04/22 posterior cervical fusion/foraminotomy;T1-4 fusion NUMBER OF LYMPH NODES REMOVED: NA  CHEMOTHERAPY: Velcade/promalyst/Dex;  RADIATION:to prox humerus and thoracic spine pathologic fx  HORMONE TREATMENT: NA  INFECTIONS: NA  FUNCTIONAL TESTS:  10/09/2022;30 second sit to stand 10 with heavy use of arms  GAIT: Distance walked: 20 Assistive device utilized: carried SPC in left Level of assistance: Complete Independence Comments: feels unsteady, very flexed neck posture   QUICK DASH SURVEY:    TODAY'S TREATMENT:                                                                                                                                         DATE:   01/17/2023 Nu step seat 12, UE 8, Level 5 x 10 min, 862 steps, O2 sats 95, HR 63 Tandem stance x 2 B to failure,   Vector reaches x 10 B,    SLS 4 times ea side to failure Mini squats 2 x 10 Walking on toes 4 lengths intermittent hand hold Marching with slow walk to end of bars 4 lengths intermittent HH, rest Shoulder ladder x 5 for flexion Lateral band walks with yellow band 4 laps in bars, hip flexion  with knee ext 2 x 10, hip ext 2 x 10 all with yellow with HH Sit to stand x 10 with intermittent use of  hands;seated rest Ambulated 707 ft with SBA and single point cane. Good pace, no LOB. Seated rest.  01/14/23;Pt seen for aquatic therapy today.  Treatment took place in water 3.5-4.75 ft in depth at the [facility] pool. Temp of water was 91.  Pt entered/exited the pool via stairs with bilat rail independently. Pt requires buoyancy of  water for support and to offload joints with strengthening exercises.Pt utilizes viscosity of the water required for strengthening.   Seated water bench with 75% submersion Pt performed seated LE AROM exercises 20x in all planes, with concurrent discussion of his current health status and pain assessment. 75% depth water walking with single buoy UE wts for push/pull 10 lengths. Hip 3 way kicks 15x Bil holding onto wall for balance with VC to push water a bit faster and adding ankle fins for more resistance. Small marching across pool holding single buoy floats under water 4 lengths f/f tandem walking holding floats 4x across pool.  Single leg press with small noodle 2x10 Bil with balance assist. Horizontal float with concurrent skin rolling on upper back and lateral sway for trunk stretching by PTA.    01/10/2023 Nu step seat 12, UE 8, Level 5 x 9 min, 727 steps Tandem stance x 2 B to failure,       SLS 4 times ea side to failure Mini squats x 10 Ax beam 8 lengths forward, 6 lengths sideways with intermittent hand touch Wall slides Right shoulder flexion x 5 Sit to stand x 10, 02 92%,  HR 67, recovered quickly to 96% Walked in clinic with SPC and CGA  360 ft, 02 92%, HR 71, recovered quickly with seated rest to 96%  PATIENT EDUCATION:  Education details: sitting posture with lumbar roll Person educated: Patient Education method: Medical illustrator Education comprehension: verbalized understanding and returned demonstration  HOME EXERCISE  PROGRAM: Doing HEP from HHPT; cervical ROM, shoulder rolls, shrugs, scap retraction, UT stretches  Aquatic Access Code: [email or web address] URL: [email or web address] Date: 11/09/2022 Prepared by: [facility] - [address] This aquatic home exercise program from Med[employer] utilizes pictures from land based exercises, but has been adapted prior to lamination and issuance.    ASSESSMENT:  CLINICAL IMPRESSION:  Pt has been feeling better and he tolerates more exercise with longer periods of standing without rest. He was able to walk a mile with 2 SPC at home. He notes he doesn't feel out of breath and notices considerable improvement over the last couple of weeks. He is also having less pain in his neck and upper back. OBJECTIVE IMPAIRMENTS: decreased activity tolerance, decreased balance, difficulty walking, decreased ROM, decreased strength, increased edema, impaired UE functional use, postural dysfunction, and pain.   ACTIVITY LIMITATIONS: carrying, lifting, dressing, reach over head, and locomotion level  PARTICIPATION LIMITATIONS: cleaning, laundry, driving, and occupation  PERSONAL FACTORS:  Multiple Myeloma  are also affecting patient's functional outcome.   REHAB POTENTIAL: Good  CLINICAL DECISION MAKING: Evolving/moderate complexity  EVALUATION COMPLEXITY: Moderate  GOALS: Goals reviewed with patient? Yes  SHORT TERM GOALS: Target date: 10/21/22  Pt will be educated in and independent in proper sitting posture using lumbar roll prn Baseline: Goal status; MET 11/08/22 2.  Pt will report decreased muscle soreness in bilateral upper back x 25% Baseline:  Goal status: ONGOING  3.  Pt will be compliant with a HEP for postural strengthening/ROM Baseline:  Goal status: MET / LONG TERM GOALS: Target date: 12/20/22  Pt will be able to bring his head to neutral position for safety with ambulation/driving Baseline:  Goal status:Ongoing( but improved) 2.  Pt  will have decreased upper back pain by 50% or more Baseline:  Goal status:Ongoing 3.  Pt will be able to flex right shoulder to 115 degrees with minimal compensation for improved reaching ability Baseline:  Goal status: ONGOING 4; Pt will  be able to maintain tandem stance on both sides x 10 sec to demonstrated improved balance. Goal status:MET right foot forward 11/08/2022, MET both legs 12/20/2022  5. Pt will be able to perform sit to stand x 5 without use of Ue's  Baseline:must use hands  Goal status:MET 12/20/2022 but difficult 6.  Pt will be able to maintain SLS on ea leg x 10 seconds without LOB  GOAL: ONGOING  PT FREQUENCY: 2x/week  PT DURATION: 6 weeks  PLANNED INTERVENTIONS: Therapeutic exercises, Therapeutic activity, Neuromuscular re-education, Balance training, Gait training, Patient/Family education, Self Care, Joint mobilization, Aquatic Therapy, Manual therapy, and Re-evaluation  PLAN FOR NEXT SESSION: Cont land and aquatic exercises working on posture, strength, balance, hold STM secondary to no longer having lasting benefit  Alvira Monday, PT 01/17/23 3:59 PM  01/17/23 3:59 PM

## 2023-01-18 NOTE — Therapy (Unsigned)
OUTPATIENT PHYSICAL THERAPY ONCOLOGY TREATMENT  Patient Name: Travis [name] MRN: [id number] DOB:19-Oct-1950, 72 y.o., male Today's Date: 01/19/2023   END OF SESSION:  PT End of Session - 01/19/23 1210     Visit Number 29    Number of Visits 37    Date for PT Re-Evaluation 02/18/23    PT Start Time 1100    PT Stop Time 1145    PT Time Calculation (min) 45 min    Activity Tolerance Patient tolerated treatment well    Behavior During Therapy WFL for tasks assessed/performed                       Past Medical History:  Diagnosis Date   A-fib (HCC)    Arthritis    "comes and goes; mostly in hands, occasionally elbow" (04/05/2017)   Complication of anesthesia    "just wanted to sleep alot  for couple days S/P VARICOCELE OR" (04/05/2017)   DVT (deep venous thrombosis) (HCC)    LLE   Dysrhythmia    PVCs    GERD (gastroesophageal reflux disease)    History of hiatal hernia    History of radiation therapy    Thoracic Spine- 07/13/22-07/23/22-Dr. Antony Blackbird   Hypertension    Impaired glucose tolerance    Multiple myeloma (HCC)    Obesity (BMI 30-39.9) 07/26/2016   OSA on CPAP 07/26/2016   Mild with AHI 12.5/hr now on CPAP at 14cm H2O   PAF (paroxysmal atrial fibrillation) (HCC)    s/p PVI Ablation in 2018 // Flecainide Rx // Echo 8/21: EF 60-65, no RWMA, mild LVH, normal RV SF, moderate RVE, mild BAE, trivial MR, mild dilation of aortic root (40 mm)   Pneumonia    "may have had walking pneumonia a few years ago" (04/05/2017)   Pre-diabetes    Thrombophlebitis    Past Surgical History:  Procedure Laterality Date   ATRIAL FIBRILLATION ABLATION  04/05/2017   ATRIAL FIBRILLATION ABLATION N/A 04/05/2017   Procedure: ATRIAL FIBRILLATION ABLATION;  Surgeon: [name], MD;  Location: [facility];  Service: Cardiovascular;  Laterality: N/A;   BONE BIOPSY Right 12/17/2021   Procedure: BONE BIOPSY;  Surgeon: [name], MD;  Location: [facility];  Service: Orthopedics;  Laterality: Right;   COMPLETE RIGHT HIP REPLACMENT  01/19/2019   EVALUATION UNDER ANESTHESIA WITH HEMORRHOIDECTOMY N/A 02/24/2017   Procedure: EXAM UNDER ANESTHESIA WITH  HEMORRHOIDECTOMY;  Surgeon: [name], MD;  Location: [facility];  Service: General;  Laterality: N/A;   EXCISIONAL TOTAL HIP ARTHROPLASTY WITH ANTIBIOTIC SPACERS Right 09/25/2020   Procedure: EXCISIONAL TOTAL HIP ARTHROPLASTY WITH ANTIBIOTIC SPACERS;  Surgeon: [name], MD;  Location: [facility];  Service: Orthopedics;  Laterality: Right;   FLEXIBLE SIGMOIDOSCOPY N/A 04/15/2015   Procedure:  UNSEDATED FLEXIBLE SIGMOIDOSCOPY;  Surgeon: [name], MD;  Location: [facility];  Service: Endoscopy;  Laterality: N/A;   HUMERUS IM NAIL Right 12/17/2021   Procedure: INTRAMEDULLARY (IM) NAIL HUMERAL;  Surgeon: [name], MD;  Location: [facility];  Service: Orthopedics;  Laterality: Right;   KNEE ARTHROSCOPY Left ~ 2016   POSTERIOR CERVICAL FUSION/FORAMINOTOMY N/A 06/04/2022   Procedure: Thoracic one - Thoracic Four Posterior lateral arthrodesis/ fusion and Thoracic two Corpectomy;  Surgeon: [name], MD;  Location: [facility];  Service: Neurosurgery;  Laterality: N/A;   REIMPLANTATION OF TOTAL HIP Right 12/25/2020   Procedure: REIMPLANTATION/REVISION OF RIGHT TOTAL HIP VERSUS REPEAT IRRIGATION AND DEBRIDEMENT;  Surgeon: [name], MD;  Location: [facility];  Service: Orthopedics;  Laterality: Right;   TESTICLE SURGERY  1988   VARICOCELE   TONSILLECTOMY     VARICOSE VEIN SURGERY Bilateral    Patient Active Problem List   Diagnosis Date Noted   Pancreatic cyst 09/30/2022   Infection due to novel influenza A virus 09/29/2022   Persistent atrial fibrillation (HCC) 07/15/2022   Acute on chronic diastolic heart failure (HCC) 07/03/2022   Pulmonary infiltrates 07/01/2022   Acute respiratory failure with hypoxia (HCC) 07/01/2022   Hyponatremia 06/29/2022   History of thoracic  surgery 06/29/2022   Acute DVT of left tibial vein (HCC) 06/21/2022   Hypokalemia 06/21/2022   Vitamin D deficiency 05/30/2022   Other constipation 05/29/2022   Pathologic compression fracture of spine, initial encounter (HCC) 05/29/2022   Cord compression (HCC) 05/28/2022   Degenerative cervical disc 05/14/2022   Hypercoagulable state due to persistent atrial fibrillation (HCC) 01/11/2022   Multiple myeloma (HCC) 12/10/2021   Medication monitoring encounter 02/27/2021   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   Status post peripherally inserted central catheter (PICC) central line placement 11/06/2020   Drug rash with eosinophilia and systemic symptoms syndrome 11/06/2020   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   PAF (paroxysmal atrial fibrillation) (HCC)    GERD (gastroesophageal reflux disease) 03/29/2018   Hiatal hernia 03/29/2018   History of DVT (deep vein thrombosis) 03/29/2018   CAP (community acquired pneumonia) 03/29/2018   Osteoarthritis 03/29/2018   Neuropathy 08/29/2017   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   Paroxysmal atrial fibrillation with rapid ventricular response (HCC) 04/05/2017   Prolapsed internal hemorrhoids, grade 4, s/p ligation/pexy/hemorrhoidectomy x 2 02/24/2017 02/24/2017   OSA (obstructive sleep apnea) 07/26/2016   Obesity (BMI 30-39.9) 07/26/2016   Atrial fibrillation with RVR (HCC)    Essential hypertension    Chest pain at rest     PCP: Eleanora Neighbor, MD  REFERRING PROVIDER: Antony Blackbird, MD  REFERRING DIAG: Multiple Myeloma  THERAPY DIAG:  Compression fracture of thoracic vertebra, unspecified thoracic vertebral level, initial encounter (HCC)  Multiple myeloma not having achieved remission (HCC)  Muscle weakness (generalized)  Abnormal posture  Neck pain  Other lack of coordination  Chronic right shoulder pain  ONSET DATE: 2023  Rationale for Evaluation and Treatment: Rehabilitation  SUBJECTIVE:                                                                                                                                                                                            SUBJECTIVE STATEMENT: Really good week last week and the weekend, today not so  good. Some more pain and unsteadiness in my legs.   PERTINENT HISTORY:  MAYLAND TREXLER 72 y.o. male with medical history significant for IgA Kappa Multiple Myeloma. He was diagnosed with smouldering myeloma in 2018 . Last July he developed right arm pain due to a pathologic fx and he underwent radiation and fixation. In early Feb 2024 he had radiation for pathologic T2 compression fx followed by a T1-T4 fusion. PAIN:  Are you having pain?  PAIN:  Are you having pain? Yes NPRS scale:  4/10 neck/upper back, 0/10 LB presently Pain location: Left greater than right LB Pain orientation: Left  PAIN TYPE: aching Pain description: constant  Aggravating factors: standing,rising from chair Relieving factors: recliner, lying down,  PRECAUTIONS: Multiple Myeloma with mets to R ribs, R humerus with ORIF for pathological fracture 12/17/21. Lesions have also been found in R rib and also in his skull. Pathologic fx T2 with fusion T1-4 on 06/04/22, Bilateral LE edema (on Lasix), Afib   WEIGHT BEARING RESTRICTIONS: No  FALLS:  Has patient fallen in last 6 months? Yes. Number of falls 1  LIVING ENVIRONMENT: Lives with: lives with their family and lives with their spouse Lives in: House/apartment Stairs: Yes; Internal: 14 steps; on left going up and External: 3 steps; on right going up Has following equipment at home: Single point cane and Walker - 4 wheeled  OCCUPATION: attorney/ mostly retired?  LEISURE: golf (can't do now.), use to play racquetball  HAND DOMINANCE: right   PRIOR LEVEL OF FUNCTION: Independent  POSTURE:significant slump sitting posture with forward head  PATIENT GOALS: decrease pain,get stronger,improve  balance   OBJECTIVE:  COGNITION: Overall cognitive status: Within functional limits for tasks assessed   PALPATION: Very tender bilateral UT/levator, scapular regions  OBSERVATIONS / OTHER ASSESSMENTS: very flexed head and neck with chin nearly at chest  SENSATION: Light touch: Appears intact    POSTURE: significant forward head, round shoulders, increased kyphosis   Gait holding single point cane in left hand but not using. Right shoulder low, flexed neck;difficulty raising head to see where he is going  UPPER EXTREMITY AROM/PROM:  A/PROM RIGHT   eval  RIGHT 11/08/2022 RIGHT 12/20/2022  Shoulder extension     Shoulder flexion 91 with compensation 101 with compensation  101 with compensation  Shoulder abduction 67 with compensation 75 with compensation 75 compensation  Shoulder internal rotation     Shoulder external rotation C6      (Blank rows = not tested)  A/PROM LEFT   eval  Shoulder extension   Shoulder flexion 123  Shoulder abduction 112  Shoulder internal rotation T7  Shoulder external rotation t3    (Blank rows = not tested)  CERVICAL AROM: All within normal limits:    Percent limited 11/08/22 12/20/2022  Flexion Fully flexed    Extension From flexed position decreased 80% Decreased 70% Decreased 40%  Right lateral flexion     Left lateral flexion     Right rotation Dec 50% sitting in FH posture  Dec 50% from forward head posture  Left rotation        Dec 50%  Decreased 50% from forward head posture    UPPER EXTREMITY STRENGTH:    LOWER EXTREMITY AROM/PROM:  A/PROM Right eval  Hip flexion   Hip extension   Hip abduction   Hip adduction   Hip internal rotation   Hip external rotation   Knee flexion   Knee extension   Ankle dorsiflexion   Ankle plantarflexion  Ankle inversion   Ankle eversion    (Blank rows = not tested)  A/PROM LEFT eval  Hip flexion   Hip extension   Hip abduction   Hip adduction   Hip internal rotation   Hip  external rotation   Knee flexion   Knee extension   Ankle dorsiflexion   Ankle plantarflexion   Ankle inversion   Ankle eversion    (Blank rows = not tested)  LOWER EXTREMITY MMT:    10/04/2022  4 position balance test; able to hold DLS feet together , and half tandem stance 10 seconds. Unable to maintain full tandem stance for 10 sec, unable to hold SLS greater than a second or 2 on left, and 4 seconds on right.     LYMPHEDEMA ASSESSMENTS:   SURGERY TYPE/DATE: 8/17/23Left prox humerus fixation for pathologic fx  2/2/2024Fusion T1-T4 for pathologic fx with cord compression 06/04/22 posterior cervical fusion/foraminotomy;T1-4 fusion NUMBER OF LYMPH NODES REMOVED: NA  CHEMOTHERAPY: Velcade/promalyst/Dex;  RADIATION:to prox humerus and thoracic spine pathologic fx  HORMONE TREATMENT: NA  INFECTIONS: NA  FUNCTIONAL TESTS:  10/09/2022;30 second sit to stand 10 with heavy use of arms  GAIT: Distance walked: 20 Assistive device utilized: carried SPC in left Level of assistance: Complete Independence Comments: feels unsteady, very flexed neck posture   QUICK DASH SURVEY:    TODAY'S TREATMENT:                                                                                                                                         DATE:   01/19/23:Pt seen for aquatic therapy today.  Treatment took place in water 3.5-4.75 ft in depth at the [facility] pool. Temp of water was 91.  Pt entered/exited the pool via stairs with bilat rail independently. Pt requires buoyancy of water for support and to offload joints with strengthening exercises.Pt utilizes viscosity of the water required for strengthening.   Seated water bench with 75% submersion Pt performed seated LE AROM exercises 20x in all planes, with concurrent discussion of his current health status and pain assessment. 75% depth water walking with single buoy UE wts for push/pull 10 lengths added ankle fins for added  resisatnce. Hip 3 way kicks 15x Bil holding onto wall for balance with VC to push water a bit faster ankle fins for more resistance. Small marching across pool holding single buoy floats under water lengths f/f tandem walking holding floats 4x across pool.  Single leg press with small noodle 2x20 Bil with balance assist. Horizontal float with concurrent skin rolling on upper back and lateral sway for trunk stretching by PTA.  01/17/2023 Nu step seat 12, UE 8, Level 5 x 10 min, 862 steps, O2 sats 95, HR 63 Tandem stance x 2 B to failure,   Vector reaches x 10 B,    SLS 4 times ea side to failure Mini squats 2 x  10 Walking on toes 4 lengths intermittent hand hold Marching with slow walk to end of bars 4 lengths intermittent HH, rest Shoulder ladder x 5 for flexion Lateral band walks with yellow band 4 laps in bars, hip flexion with knee ext 2 x 10, hip ext 2 x 10 all with yellow with HH Sit to stand x 10 with intermittent use of  hands;seated rest Ambulated 707 ft with SBA and single point cane. Good pace, no LOB. Seated rest.  01/14/23;Pt seen for aquatic therapy today.  Treatment took place in water 3.5-4.75 ft in depth at the [facility] pool. Temp of water was 91.  Pt entered/exited the pool via stairs with bilat rail independently. Pt requires buoyancy of water for support and to offload joints with strengthening exercises.Pt utilizes viscosity of the water required for strengthening.   Seated water bench with 75% submersion Pt performed seated LE AROM exercises 20x in all planes, with concurrent discussion of his current health status and pain assessment. 75% depth water walking with single buoy UE wts for push/pull 10 lengths. Hip 3 way kicks 15x Bil holding onto wall for balance with VC to push water a bit faster and adding ankle fins for more resistance. Small marching across pool holding single buoy floats under water 4 lengths f/f tandem walking holding floats 4x across pool.  Single  leg press with small noodle 2x10 Bil with balance assist. Horizontal float with concurrent skin rolling on upper back and lateral sway for trunk stretching by PTA.    PATIENT EDUCATION:  Education details: sitting posture with lumbar roll Person educated: Patient Education method: Medical illustrator Education comprehension: verbalized understanding and returned demonstration  HOME EXERCISE PROGRAM: Doing HEP from HHPT; cervical ROM, shoulder rolls, shrugs, scap retraction, UT stretches  Aquatic Access Code: [email or web address] URL: [email or web address] Date: 11/09/2022 Prepared by: [facility] [address] [facility] This aquatic home exercise program from MedBridge utilizes pictures from land based exercises, but has been adapted prior to lamination and issuance.    ASSESSMENT:  CLINICAL IMPRESSION: Neck pain started at 4/10 and reduced to 2-3/10 at conclusion of session. Pt increased reps with no problems and continues to lift his head with all activities.   OBJECTIVE IMPAIRMENTS: decreased activity tolerance, decreased balance, difficulty walking, decreased ROM, decreased strength, increased edema, impaired UE functional use, postural dysfunction, and pain.   ACTIVITY LIMITATIONS: carrying, lifting, dressing, reach over head, and locomotion level  PARTICIPATION LIMITATIONS: cleaning, laundry, driving, and occupation  PERSONAL FACTORS:  Multiple Myeloma  are also affecting patient's functional outcome.   REHAB POTENTIAL: Good  CLINICAL DECISION MAKING: Evolving/moderate complexity  EVALUATION COMPLEXITY: Moderate  GOALS: Goals reviewed with patient? Yes  SHORT TERM GOALS: Target date: 10/21/22  Pt will be educated in and independent in proper sitting posture using lumbar roll prn Baseline: Goal status; MET 11/08/22 2.  Pt will report decreased muscle soreness in bilateral upper back x 25% Baseline:  Goal status: ONGOING  3.  Pt will be compliant  with a HEP for postural strengthening/ROM Baseline:  Goal status: MET / LONG TERM GOALS: Target date: 12/20/22  Pt will be able to bring his head to neutral position for safety with ambulation/driving Baseline:  Goal status:Ongoing( but improved) 2.  Pt will have decreased upper back pain by 50% or more Baseline:  Goal status:Ongoing 3.  Pt will be able to flex right shoulder to 115 degrees with minimal compensation for improved reaching ability Baseline:  Goal status: ONGOING 4;  Pt will be able to maintain tandem stance on both sides x 10 sec to demonstrated improved balance. Goal status:MET right foot forward 11/08/2022, MET both legs 12/20/2022  5. Pt will be able to perform sit to stand x 5 without use of Ue's  Baseline:must use hands  Goal status:MET 12/20/2022 but difficult 6.  Pt will be able to maintain SLS on ea leg x 10 seconds without LOB  GOAL: ONGOING  PT FREQUENCY: 2x/week  PT DURATION: 6 weeks  PLANNED INTERVENTIONS: Therapeutic exercises, Therapeutic activity, Neuromuscular re-education, Balance training, Gait training, Patient/Family education, Self Care, Joint mobilization, Aquatic Therapy, Manual therapy, and Re-evaluation  PLAN FOR NEXT SESSION: Cont land and aquatic exercises working on posture, strength, balance, hold STM secondary to no longer having lasting benefit  Ane Payment, PTA 01/19/23 12:10 PM   01/19/23 12:10 PM

## 2023-01-19 ENCOUNTER — Inpatient Hospital Stay: Payer: Medicare Other

## 2023-01-19 ENCOUNTER — Ambulatory Visit: Payer: Medicare Other | Admitting: Physical Therapy

## 2023-01-19 ENCOUNTER — Encounter: Payer: Self-pay | Admitting: Physical Therapy

## 2023-01-19 VITALS — BP 152/61 | HR 60 | Temp 98.7°F | Resp 18 | Ht 70.0 in | Wt 259.5 lb

## 2023-01-19 DIAGNOSIS — Z79624 Long term (current) use of inhibitors of nucleotide synthesis: Secondary | ICD-10-CM | POA: Diagnosis not present

## 2023-01-19 DIAGNOSIS — R202 Paresthesia of skin: Secondary | ICD-10-CM | POA: Diagnosis not present

## 2023-01-19 DIAGNOSIS — R278 Other lack of coordination: Secondary | ICD-10-CM | POA: Diagnosis not present

## 2023-01-19 DIAGNOSIS — M5126 Other intervertebral disc displacement, lumbar region: Secondary | ICD-10-CM | POA: Diagnosis not present

## 2023-01-19 DIAGNOSIS — I700 Atherosclerosis of aorta: Secondary | ICD-10-CM | POA: Diagnosis not present

## 2023-01-19 DIAGNOSIS — M546 Pain in thoracic spine: Secondary | ICD-10-CM | POA: Diagnosis not present

## 2023-01-19 DIAGNOSIS — C9000 Multiple myeloma not having achieved remission: Secondary | ICD-10-CM

## 2023-01-19 DIAGNOSIS — R600 Localized edema: Secondary | ICD-10-CM | POA: Diagnosis not present

## 2023-01-19 DIAGNOSIS — Z5112 Encounter for antineoplastic immunotherapy: Secondary | ICD-10-CM | POA: Diagnosis not present

## 2023-01-19 DIAGNOSIS — I10 Essential (primary) hypertension: Secondary | ICD-10-CM | POA: Diagnosis not present

## 2023-01-19 DIAGNOSIS — Z7961 Long term (current) use of immunomodulator: Secondary | ICD-10-CM | POA: Diagnosis not present

## 2023-01-19 DIAGNOSIS — M533 Sacrococcygeal disorders, not elsewhere classified: Secondary | ICD-10-CM | POA: Diagnosis not present

## 2023-01-19 DIAGNOSIS — Z7952 Long term (current) use of systemic steroids: Secondary | ICD-10-CM | POA: Diagnosis not present

## 2023-01-19 DIAGNOSIS — M6281 Muscle weakness (generalized): Secondary | ICD-10-CM | POA: Diagnosis not present

## 2023-01-19 DIAGNOSIS — M542 Cervicalgia: Secondary | ICD-10-CM

## 2023-01-19 DIAGNOSIS — M48061 Spinal stenosis, lumbar region without neurogenic claudication: Secondary | ICD-10-CM | POA: Diagnosis not present

## 2023-01-19 DIAGNOSIS — Z7901 Long term (current) use of anticoagulants: Secondary | ICD-10-CM | POA: Diagnosis not present

## 2023-01-19 DIAGNOSIS — G8929 Other chronic pain: Secondary | ICD-10-CM | POA: Diagnosis not present

## 2023-01-19 DIAGNOSIS — R21 Rash and other nonspecific skin eruption: Secondary | ICD-10-CM | POA: Diagnosis not present

## 2023-01-19 DIAGNOSIS — R1310 Dysphagia, unspecified: Secondary | ICD-10-CM | POA: Diagnosis not present

## 2023-01-19 DIAGNOSIS — G4733 Obstructive sleep apnea (adult) (pediatric): Secondary | ICD-10-CM | POA: Diagnosis not present

## 2023-01-19 DIAGNOSIS — E876 Hypokalemia: Secondary | ICD-10-CM | POA: Diagnosis not present

## 2023-01-19 DIAGNOSIS — I5030 Unspecified diastolic (congestive) heart failure: Secondary | ICD-10-CM | POA: Diagnosis not present

## 2023-01-19 DIAGNOSIS — S22000A Wedge compression fracture of unspecified thoracic vertebra, initial encounter for closed fracture: Secondary | ICD-10-CM

## 2023-01-19 DIAGNOSIS — I5033 Acute on chronic diastolic (congestive) heart failure: Secondary | ICD-10-CM | POA: Diagnosis not present

## 2023-01-19 DIAGNOSIS — R293 Abnormal posture: Secondary | ICD-10-CM | POA: Diagnosis not present

## 2023-01-19 DIAGNOSIS — I480 Paroxysmal atrial fibrillation: Secondary | ICD-10-CM | POA: Diagnosis not present

## 2023-01-19 DIAGNOSIS — M25511 Pain in right shoulder: Secondary | ICD-10-CM | POA: Diagnosis not present

## 2023-01-19 DIAGNOSIS — M25519 Pain in unspecified shoulder: Secondary | ICD-10-CM | POA: Diagnosis not present

## 2023-01-19 DIAGNOSIS — I708 Atherosclerosis of other arteries: Secondary | ICD-10-CM | POA: Diagnosis not present

## 2023-01-19 LAB — CMP (CANCER CENTER ONLY)
ALT: 18 U/L (ref 0–44)
AST: 18 U/L (ref 15–41)
Albumin: 4.1 g/dL (ref 3.5–5.0)
Alkaline Phosphatase: 74 U/L (ref 38–126)
Anion gap: 7 (ref 5–15)
BUN: 31 mg/dL — ABNORMAL HIGH (ref 8–23)
CO2: 29 mmol/L (ref 22–32)
Calcium: 9.7 mg/dL (ref 8.9–10.3)
Chloride: 105 mmol/L (ref 98–111)
Creatinine: 1.1 mg/dL (ref 0.61–1.24)
GFR, Estimated: >60 mL/min (ref >60)
Glucose, Bld: 123 mg/dL — ABNORMAL HIGH (ref 70–99)
Potassium: 4.1 mmol/L (ref 3.5–5.1)
Sodium: 141 mmol/L (ref 135–145)
Total Bilirubin: 0.5 mg/dL (ref 0.3–1.2)
Total Protein: 6.5 g/dL (ref 6.5–8.1)

## 2023-01-19 LAB — CBC WITH DIFFERENTIAL (CANCER CENTER ONLY)
Abs Immature Granulocytes: 0.08 10*3/uL — ABNORMAL HIGH (ref 0.00–0.07)
Basophils Absolute: 0.1 10*3/uL (ref 0.0–0.1)
Basophils Relative: 0 %
Eosinophils Absolute: 0.1 10*3/uL (ref 0.0–0.5)
Eosinophils Relative: 0 %
HCT: 37.3 % — ABNORMAL LOW (ref 39.0–52.0)
Hemoglobin: 12 g/dL — ABNORMAL LOW (ref 13.0–17.0)
Immature Granulocytes: 1 %
Lymphocytes Relative: 3 %
Lymphs Abs: 0.5 10*3/uL — ABNORMAL LOW (ref 0.7–4.0)
MCH: 30.1 pg (ref 26.0–34.0)
MCHC: 32.2 g/dL (ref 30.0–36.0)
MCV: 93.5 fL (ref 80.0–100.0)
Monocytes Absolute: 0.2 10*3/uL (ref 0.1–1.0)
Monocytes Relative: 2 %
Neutro Abs: 12.5 10*3/uL — ABNORMAL HIGH (ref 1.7–7.7)
Neutrophils Relative %: 94 %
Platelet Count: 257 10*3/uL (ref 150–400)
RBC: 3.99 MIL/uL — ABNORMAL LOW (ref 4.22–5.81)
RDW: 18.4 % — ABNORMAL HIGH (ref 11.5–15.5)
WBC Count: 13.4 10*3/uL — ABNORMAL HIGH (ref 4.0–10.5)
nRBC: 0 % (ref 0.0–0.2)

## 2023-01-19 MED ORDER — HEPARIN SOD (PORK) LOCK FLUSH 100 UNIT/ML IV SOLN: 500.0000 [IU] | Freq: Once | INTRAVENOUS | Status: DC | PRN

## 2023-01-19 MED ORDER — SODIUM CHLORIDE 0.9% FLUSH: 3.0000 mL | INTRAVENOUS | Status: DC | PRN

## 2023-01-19 MED ORDER — DEXTROSE 5 % IV SOLN
70.0000 mg/m2 | Freq: Once | INTRAVENOUS | Status: AC
  Administered 2023-01-19: 150 mg via INTRAVENOUS
  Filled 2023-01-19: qty 60

## 2023-01-19 MED ORDER — SODIUM CHLORIDE 0.9 % IV SOLN: Freq: Once | INTRAVENOUS | Status: DC

## 2023-01-19 MED ORDER — HEPARIN SOD (PORK) LOCK FLUSH 100 UNIT/ML IV SOLN: 250.0000 [IU] | Freq: Once | INTRAVENOUS | Status: DC | PRN

## 2023-01-19 MED ORDER — ALTEPLASE 2 MG IJ SOLR: 2.0000 mg | Freq: Once | INTRAMUSCULAR | Status: DC | PRN

## 2023-01-19 MED ORDER — SODIUM CHLORIDE 0.9% FLUSH: 10.0000 mL | INTRAVENOUS | Status: DC | PRN

## 2023-01-19 MED ORDER — SODIUM CHLORIDE 0.9 % IV SOLN: Freq: Once | INTRAVENOUS | Status: AC

## 2023-01-19 NOTE — Progress Notes (Signed)
Pimple like rash to face, denies itching. Rash started last Wednesday. Ok to treat per Dr. Leonides Schanz.  Took 28mg  of decadron at home prior to visit, Ok to treat per Dr. Leonides Schanz.

## 2023-01-19 NOTE — Patient Instructions (Addendum)
[facility]  Discharge Instructions: Thank you for choosing [facility] to provide your oncology and hematology care.   If you have a lab appointment with the Cancer Center, please go directly to the Cancer Center and check in at the registration area.   Wear comfortable clothing and clothing appropriate for easy access to any Portacath or PICC line.   We strive to give you quality time with your provider. You may need to reschedule your appointment if you arrive late (15 or more minutes).  Arriving late affects you and other patients whose appointments are after yours.  Also, if you miss three or more appointments without notifying the office, you may be dismissed from the clinic at the provider's discretion.      For prescription refill requests, have your pharmacy contact our office and allow 72 hours for refills to be completed.    Today you received the following chemotherapy and/or immunotherapy agents: Kyprolis.     To help prevent nausea and vomiting after your treatment, we encourage you to take your nausea medication as directed.  BELOW ARE SYMPTOMS THAT SHOULD BE REPORTED IMMEDIATELY: *FEVER GREATER THAN 100.4 F (38 C) OR HIGHER *CHILLS OR SWEATING *NAUSEA AND VOMITING THAT IS NOT CONTROLLED WITH YOUR NAUSEA MEDICATION *UNUSUAL SHORTNESS OF BREATH *UNUSUAL BRUISING OR BLEEDING *URINARY PROBLEMS (pain or burning when urinating, or frequent urination) *BOWEL PROBLEMS (unusual diarrhea, constipation, pain near the anus) TENDERNESS IN MOUTH AND THROAT WITH OR WITHOUT PRESENCE OF ULCERS (sore throat, sores in mouth, or a toothache) UNUSUAL RASH, SWELLING OR PAIN  UNUSUAL VAGINAL DISCHARGE OR ITCHING   Items with * indicate a potential emergency and should be followed up as soon as possible or go to the Emergency Department if any problems should occur.  Please show the CHEMOTHERAPY ALERT CARD or IMMUNOTHERAPY ALERT CARD at  check-in to the Emergency Department and triage nurse.  Should you have questions after your visit or need to cancel or reschedule your appointment, please contact [facility]  Dept: [phone]  and follow the prompts.  Office hours are 8:00 a.m. to 4:30 p.m. Monday - Friday. Please note that voicemails left after 4:00 p.m. may not be returned until the following business day.  We are closed weekends and major holidays. You have access to a nurse at all times for urgent questions. Please call the main number to the clinic Dept: [phone] and follow the prompts.   For any non-urgent questions, you may also contact your provider using MyChart. We now offer e-Visits for anyone 71 and older to request care online for non-urgent symptoms. For details visit [email or web address].   Also download the MyChart app! Go to the app store, search "MyChart", open the app, select [facility], and log in with your MyChart username and password.  Per Dr. Leonides Schanz apply over the counter bacitracin cream. Dr. Leonides Schanz will evaluate next week at appointment.

## 2023-01-20 ENCOUNTER — Telehealth: Payer: Self-pay | Admitting: Cardiology

## 2023-01-20 NOTE — Telephone Encounter (Signed)
Patient called in reporting his blood pressures have been elevated today, most recent was up to 200 systolic. Has also been feeling short of breath today, thinks he is retaining fluid. Was recently seen in the office with Dr. [name] and metolazone (every other day dosing was stopped in the setting of hypokalemia) and spiro 25mg  daily started. Takes hydralazine 100/75/100 daily. Increased to 100/100 so far today. He denies any other symptoms currently. Just had labs yesterday with K+ 4.1, Cr 1.1. I advised he take an extra torsemide, along with metolazone 2.5mg  x1 and his evening dose of hydralazine 100mg  now. Hopefully this will help with increasing his UOP (he feels it has been down) along with BP this evening. ER precautions given. Advised I will route to Dr. [name] to determine if additional changes are advised to his regimen he has been on. Patient thanked me for callback and voiced understanding of plan

## 2023-01-21 ENCOUNTER — Other Ambulatory Visit: Payer: Self-pay | Admitting: *Deleted

## 2023-01-21 ENCOUNTER — Other Ambulatory Visit: Payer: Self-pay

## 2023-01-21 MED ORDER — SPIRONOLACTONE 25 MG PO TABS: 12.5000 mg | ORAL_TABLET | Freq: Every day | ORAL | Status: DC

## 2023-01-21 MED ORDER — METOLAZONE 2.5 MG PO TABS: 2.5000 mg | ORAL_TABLET | ORAL | Status: DC

## 2023-01-25 ENCOUNTER — Other Ambulatory Visit: Payer: Self-pay | Admitting: Hematology and Oncology

## 2023-01-25 DIAGNOSIS — C9000 Multiple myeloma not having achieved remission: Secondary | ICD-10-CM

## 2023-01-26 ENCOUNTER — Inpatient Hospital Stay: Payer: Medicare Other

## 2023-01-26 ENCOUNTER — Inpatient Hospital Stay (HOSPITAL_BASED_OUTPATIENT_CLINIC_OR_DEPARTMENT_OTHER): Payer: Medicare Other | Admitting: Hematology and Oncology

## 2023-01-26 ENCOUNTER — Other Ambulatory Visit: Payer: Self-pay | Admitting: *Deleted

## 2023-01-26 ENCOUNTER — Telehealth: Payer: Self-pay

## 2023-01-26 VITALS — BP 132/52 | HR 58 | Temp 98.0°F | Resp 16 | Wt 254.9 lb

## 2023-01-26 VITALS — BP 133/66 | HR 57 | Resp 16

## 2023-01-26 DIAGNOSIS — M546 Pain in thoracic spine: Secondary | ICD-10-CM | POA: Diagnosis not present

## 2023-01-26 DIAGNOSIS — R202 Paresthesia of skin: Secondary | ICD-10-CM | POA: Diagnosis not present

## 2023-01-26 DIAGNOSIS — M5126 Other intervertebral disc displacement, lumbar region: Secondary | ICD-10-CM | POA: Diagnosis not present

## 2023-01-26 DIAGNOSIS — R1310 Dysphagia, unspecified: Secondary | ICD-10-CM | POA: Diagnosis not present

## 2023-01-26 DIAGNOSIS — C9000 Multiple myeloma not having achieved remission: Secondary | ICD-10-CM

## 2023-01-26 DIAGNOSIS — I5033 Acute on chronic diastolic (congestive) heart failure: Secondary | ICD-10-CM

## 2023-01-26 DIAGNOSIS — T451X5A Adverse effect of antineoplastic and immunosuppressive drugs, initial encounter: Secondary | ICD-10-CM | POA: Diagnosis not present

## 2023-01-26 DIAGNOSIS — M48061 Spinal stenosis, lumbar region without neurogenic claudication: Secondary | ICD-10-CM | POA: Diagnosis not present

## 2023-01-26 DIAGNOSIS — Z7901 Long term (current) use of anticoagulants: Secondary | ICD-10-CM | POA: Diagnosis not present

## 2023-01-26 DIAGNOSIS — I5030 Unspecified diastolic (congestive) heart failure: Secondary | ICD-10-CM | POA: Diagnosis not present

## 2023-01-26 DIAGNOSIS — I708 Atherosclerosis of other arteries: Secondary | ICD-10-CM | POA: Diagnosis not present

## 2023-01-26 DIAGNOSIS — Z5112 Encounter for antineoplastic immunotherapy: Secondary | ICD-10-CM | POA: Diagnosis not present

## 2023-01-26 DIAGNOSIS — E876 Hypokalemia: Secondary | ICD-10-CM | POA: Diagnosis not present

## 2023-01-26 DIAGNOSIS — Z7952 Long term (current) use of systemic steroids: Secondary | ICD-10-CM | POA: Diagnosis not present

## 2023-01-26 DIAGNOSIS — D61810 Antineoplastic chemotherapy induced pancytopenia: Secondary | ICD-10-CM | POA: Diagnosis not present

## 2023-01-26 DIAGNOSIS — G4733 Obstructive sleep apnea (adult) (pediatric): Secondary | ICD-10-CM | POA: Diagnosis not present

## 2023-01-26 DIAGNOSIS — Z7961 Long term (current) use of immunomodulator: Secondary | ICD-10-CM | POA: Diagnosis not present

## 2023-01-26 DIAGNOSIS — M542 Cervicalgia: Secondary | ICD-10-CM | POA: Diagnosis not present

## 2023-01-26 DIAGNOSIS — M533 Sacrococcygeal disorders, not elsewhere classified: Secondary | ICD-10-CM | POA: Diagnosis not present

## 2023-01-26 DIAGNOSIS — Z79624 Long term (current) use of inhibitors of nucleotide synthesis: Secondary | ICD-10-CM | POA: Diagnosis not present

## 2023-01-26 DIAGNOSIS — M25519 Pain in unspecified shoulder: Secondary | ICD-10-CM | POA: Diagnosis not present

## 2023-01-26 DIAGNOSIS — I480 Paroxysmal atrial fibrillation: Secondary | ICD-10-CM | POA: Diagnosis not present

## 2023-01-26 DIAGNOSIS — I700 Atherosclerosis of aorta: Secondary | ICD-10-CM | POA: Diagnosis not present

## 2023-01-26 DIAGNOSIS — R600 Localized edema: Secondary | ICD-10-CM | POA: Diagnosis not present

## 2023-01-26 DIAGNOSIS — R21 Rash and other nonspecific skin eruption: Secondary | ICD-10-CM | POA: Diagnosis not present

## 2023-01-26 DIAGNOSIS — I10 Essential (primary) hypertension: Secondary | ICD-10-CM | POA: Diagnosis not present

## 2023-01-26 LAB — CBC WITH DIFFERENTIAL (CANCER CENTER ONLY)
Abs Immature Granulocytes: 0.03 10*3/uL (ref 0.00–0.07)
Basophils Absolute: 0 10*3/uL (ref 0.0–0.1)
Basophils Relative: 0 %
Eosinophils Absolute: 0.3 10*3/uL (ref 0.0–0.5)
Eosinophils Relative: 3 %
HCT: 36.1 % — ABNORMAL LOW (ref 39.0–52.0)
Hemoglobin: 11.9 g/dL — ABNORMAL LOW (ref 13.0–17.0)
Immature Granulocytes: 0 %
Lymphocytes Relative: 6 %
Lymphs Abs: 0.6 10*3/uL — ABNORMAL LOW (ref 0.7–4.0)
MCH: 30.4 pg (ref 26.0–34.0)
MCHC: 33 g/dL (ref 30.0–36.0)
MCV: 92.3 fL (ref 80.0–100.0)
Monocytes Absolute: 1.4 10*3/uL — ABNORMAL HIGH (ref 0.1–1.0)
Monocytes Relative: 14 %
Neutro Abs: 7.6 10*3/uL (ref 1.7–7.7)
Neutrophils Relative %: 77 %
Platelet Count: 161 10*3/uL (ref 150–400)
RBC: 3.91 MIL/uL — ABNORMAL LOW (ref 4.22–5.81)
RDW: 18 % — ABNORMAL HIGH (ref 11.5–15.5)
WBC Count: 9.8 10*3/uL (ref 4.0–10.5)
nRBC: 0 % (ref 0.0–0.2)

## 2023-01-26 LAB — CMP (CANCER CENTER ONLY)
ALT: 19 U/L (ref 0–44)
AST: 18 U/L (ref 15–41)
Albumin: 3.9 g/dL (ref 3.5–5.0)
Alkaline Phosphatase: 72 U/L (ref 38–126)
Anion gap: 9 (ref 5–15)
BUN: 33 mg/dL — ABNORMAL HIGH (ref 8–23)
CO2: 30 mmol/L (ref 22–32)
Calcium: 9.2 mg/dL (ref 8.9–10.3)
Chloride: 102 mmol/L (ref 98–111)
Creatinine: 1.42 mg/dL — ABNORMAL HIGH (ref 0.61–1.24)
GFR, Estimated: 53 mL/min — ABNORMAL LOW (ref >60)
Glucose, Bld: 120 mg/dL — ABNORMAL HIGH (ref 70–99)
Potassium: 2.9 mmol/L — ABNORMAL LOW (ref 3.5–5.1)
Sodium: 141 mmol/L (ref 135–145)
Total Bilirubin: 0.5 mg/dL (ref 0.3–1.2)
Total Protein: 6.1 g/dL — ABNORMAL LOW (ref 6.5–8.1)

## 2023-01-26 MED ORDER — DEXTROSE 5 % IV SOLN
70.0000 mg/m2 | Freq: Once | INTRAVENOUS | Status: AC
  Administered 2023-01-26: 150 mg via INTRAVENOUS
  Filled 2023-01-26: qty 60

## 2023-01-26 MED ORDER — SODIUM CHLORIDE 0.9 % IV SOLN: Freq: Once | INTRAVENOUS | Status: AC

## 2023-01-26 MED ORDER — POTASSIUM CHLORIDE CRYS ER 20 MEQ PO TBCR: 60.0000 meq | EXTENDED_RELEASE_TABLET | Freq: Three times a day (TID) | ORAL | Status: DC

## 2023-01-26 NOTE — Progress Notes (Unsigned)
[facility] Telephone:([phone]   [phone]) [phone]  PROGRESS NOTE  Patient Care Team: [name], MD as PCP - General (Internal Medicine) Pricilla Riffle, MD as PCP - Cardiology (Cardiology) Quintella Reichert, MD as PCP - Sleep Medicine (Cardiology) Lanier Prude, MD as PCP - Electrophysiology (Cardiology) [name], MD as Consulting Physician (General Surgery) Vida Rigger, MD as Consulting Physician (Gastroenterology) Quintella Reichert, MD as Consulting Physician (Sleep Medicine) Glendale Chard, DO as Consulting Physician (Neurology)  Hematological/Oncological History # IgA Kappa Multiple Myeloma 05/05/2020: Bone marrow biopsy showed increased number of plasma cells representing 14% of all cells in the aspirate associated with numerous variably sized clusters in the clot and biopsy sections 04/10/2021: M protein 0.3, IgA kappa specificity. Kappa 580.5, Labda 7.0, ratio 82.93.   12/07/2021: Labs show of 1971.3, lambda 5.5, ratio 358.42 12/10/2021: Transition care to Dr. Leonides Schanz. 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  01/26/2022: Cycle 2 Day 1 of Dara/Rev/Dex 02/22/2022: Cycle 3 Day 1 of Dara/Rev/Dex 03/23/2022: Cycle 4 Day 1 of Dara/Rev/Dex 04/20/2022: Cycle 5 Day 1 of Dara/Rev/Dex 05/18/2022: Cycle 6 Day 1 of Dara/Rev/Dex 05/27/2022: MRI cervical/thoracic/lumbar: pathologic fracture at T2 with significant osseous retropulsion and resulting cord compression. Additional smaller lesions within T1 vertebral body, right C7 and T3 articulating facets.  Small Multiple Myeloma lesions scattered at all lumbar levels and in the visible left sacral ala. Largest lumbar tumor is 2.3 cm in the left L1 posterior elements and pedicle. No lumbar extraosseous or epidural tumor extension, or pathologic fracture.Underlying chronic lumbar spine  degeneration, with a new central disc herniation at L3-L4 since 2021 now resulting in mild to moderate degenerative spinal stenosis there. 05/28/2022-06/09/2022: Admitted for T2 pathologic fracture and cord compression. He received urgent radiation therapy for a total of 3.0 Gy. He underwent  thoracic 1 through 4 posterior lateral arthrodesis/fusion and 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  06/23/2022: Cycle 1 Day 1 of Velcade/Pomalyst/Dex 08/04/2022: Cycle 2 Day 1 of Velcade/Pomalyst/Dex. Pomalyst to be decreased to 2mg  PO daily due to cytopenias.  09/01/2022: Cycle 3 Day 1 of Velcade/Pomalyst/Dex. (Missed 2nd dose of cycle due to hospitalization)  10/06/2022: Cycle 4 Day 1 of Velcade/Pomalyst/Dex.  10/27/2022: Cycle 5 Day 1 of Velcade/Pomalyst/Dex.  12/02/2022: Cycle 6 Day 1 of Velcade/Pomalyst/Dex. 01/12/2023: Cycle 1 Day 1 of Kyprolis/Dex    Interval History:  Travis [name] 72 y.o. male with medical history significant for IgA Kappa Multiple Myeloma who presents for a follow up visit. He was last seen on 01/12/2023. In the interim, he continued on Velcade/Pomalyst/Dex therapy.  On exam today Travis [name] reports he had a flat tire and that was unfortunately the cause for him running late today.  He reports that he tolerated his Kyprolis well and is currently due for his third dose.  He notes with the first 2 doses he had no problems other than insomnia.  He reports that he does have some mild tingling of his fingers and toes and he does occasionally drop some objects.  He reports he does have some pain in his shoulder blade but is able to lift it higher and higher.  He reports overall he is been eating well and has not had any infectious symptoms such as runny nose, sore throat, or cough.  He reports he does bruise easily but does not have any overt signs of  bleeding.  He is doing his best to stay hydrated and is faithfully taking his potassium pills.  He is willing and able to proceed  with treatment at this time. He denies fevers, chills, sweats, shortness of breath, chest pain or cough. He has no other complaints. Rest of the 10 point ROS is below.     MEDICAL HISTORY:  Past Medical History:  Diagnosis Date   A-fib [facility])    Arthritis    "comes and goes; mostly in hands, occasionally elbow" (04/05/2017)   Complication of anesthesia    "just wanted to sleep alot  for couple days S/P VARICOCELE OR" (04/05/2017)   DVT (deep venous thrombosis) (HCC)    LLE   Dysrhythmia    PVCs    GERD (gastroesophageal reflux disease)    History of hiatal hernia    History of radiation therapy    Thoracic Spine- 07/13/22-07/23/22-Dr. Antony Blackbird   Hypertension    Impaired glucose tolerance    Multiple myeloma (HCC)    Obesity (BMI 30-39.9) 07/26/2016   OSA on CPAP 07/26/2016   Mild with AHI 12.5/hr now on CPAP at 14cm H2O   PAF (paroxysmal atrial fibrillation) (HCC)    s/p PVI Ablation in 2018 // Flecainide Rx // Echo 8/21: EF 60-65, no RWMA, mild LVH, normal RV SF, moderate RVE, mild BAE, trivial MR, mild dilation of aortic root (40 mm)   Pneumonia    "may have had walking pneumonia a few years ago" (04/05/2017)   Pre-diabetes    Thrombophlebitis     SURGICAL HISTORY: Past Surgical History:  Procedure Laterality Date   ATRIAL FIBRILLATION ABLATION  04/05/2017   ATRIAL FIBRILLATION ABLATION N/A 04/05/2017   Procedure: ATRIAL FIBRILLATION ABLATION;  Surgeon: [name], MD;  Location: [facility];  Service: Cardiovascular;  Laterality: N/A;   BONE BIOPSY Right 12/17/2021   Procedure: BONE BIOPSY;  Surgeon: [name], MD;  Location: [facility];  Service: Orthopedics;  Laterality: Right;   COMPLETE RIGHT HIP REPLACMENT  01/19/2019   EVALUATION UNDER ANESTHESIA WITH HEMORRHOIDECTOMY N/A 02/24/2017   Procedure: EXAM UNDER ANESTHESIA WITH  HEMORRHOIDECTOMY;  Surgeon: [name], MD;  Location: [facility];  Service: General;  Laterality: N/A;   EXCISIONAL  TOTAL HIP ARTHROPLASTY WITH ANTIBIOTIC SPACERS Right 09/25/2020   Procedure: EXCISIONAL TOTAL HIP ARTHROPLASTY WITH ANTIBIOTIC SPACERS;  Surgeon: [name], MD;  Location: [facility];  Service: Orthopedics;  Laterality: Right;   FLEXIBLE SIGMOIDOSCOPY N/A 04/15/2015   Procedure:  UNSEDATED FLEXIBLE SIGMOIDOSCOPY;  Surgeon: [name], MD;  Location: [facility];  Service: Endoscopy;  Laterality: N/A;   HUMERUS IM NAIL Right 12/17/2021   Procedure: INTRAMEDULLARY (IM) NAIL HUMERAL;  Surgeon: [name], MD;  Location: [facility];  Service: Orthopedics;  Laterality: Right;   KNEE ARTHROSCOPY Left ~ 2016   POSTERIOR CERVICAL FUSION/FORAMINOTOMY N/A 06/04/2022   Procedure: Thoracic one - Thoracic Four Posterior lateral arthrodesis/ fusion and Thoracic two Corpectomy;  Surgeon: [name], MD;  Location: [facility];  Service: Neurosurgery;  Laterality: N/A;   REIMPLANTATION OF TOTAL HIP Right 12/25/2020   Procedure: REIMPLANTATION/REVISION OF RIGHT TOTAL HIP VERSUS REPEAT IRRIGATION AND DEBRIDEMENT;  Surgeon: [name], MD;  Location: [facility];  Service: Orthopedics;  Laterality: Right;   TESTICLE SURGERY  1988   VARICOCELE   TONSILLECTOMY     VARICOSE VEIN SURGERY Bilateral     SOCIAL HISTORY: Social History   Socioeconomic History   Marital status: Married  Spouse name: Not on file   Number of children: 1   Years of education: law school   Highest education level: Not on file  Occupational History   Occupation: lawyer  Tobacco Use   Smoking status: Never   Smokeless tobacco: Never  Vaping Use   Vaping status: Never Used  Substance and Sexual Activity   Alcohol use: Yes    Alcohol/week: 2.0 standard drinks of alcohol    Types: 2 Glasses of wine per week    Comment: 2 glasses of wine a week, occ bourbon, beer   Drug use: No   Sexual activity: Not Currently  Other Topics Concern   Not on file  Social History Narrative   Lives in [place] with spouse in a 2  story home.  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 1 daughter.     Education: Social worker school.    Social Determinants of Health   Financial Resource Strain: Not on file  Food Insecurity: No Food Insecurity (09/27/2022)   Hunger Vital Sign    Worried About Running Out of Food in the Last Year: Never true    Ran Out of Food in the Last Year: Never true  Transportation Needs: No Transportation Needs (09/27/2022)   PRAPARE - Administrator, Civil Service (Medical): No    Lack of Transportation (Non-Medical): No  Physical Activity: Not on file  Stress: Not on file  Social Connections: Unknown (09/15/2021)   Received from [facility]   Social Network    Social Network: Not on file  Intimate Partner Violence: Not At Risk (09/27/2022)   Humiliation, Afraid, Rape, and Kick questionnaire    Fear of Current or Ex-Partner: No    Emotionally Abused: No    Physically Abused: No    Sexually Abused: No    FAMILY HISTORY: Family History  Problem Relation Age of Onset   Dementia Mother    Stroke Father    Atrial fibrillation Father    Hypertension Father    Hypertension Paternal Grandfather    Dementia Maternal Grandmother     ALLERGIES:  is allergic to linezolid and vancomycin.  MEDICATIONS:  Current Outpatient Medications  Medication Sig Dispense Refill   acyclovir (ZOVIRAX) 400 MG tablet Take 1 tablet (400 mg total) by mouth daily. 30 tablet 3   albuterol (VENTOLIN HFA) 108 (90 Base) MCG/ACT inhaler Inhale 2 puffs into the lungs every 6 (six) hours as needed for wheezing or shortness of breath. 8 g 0   amiodarone (PACERONE) 200 MG tablet Take 1 tablet (200 mg total) by mouth daily. Start after finishing 40 mg twice daily dose of  days (Patient taking differently: Take 200 mg by mouth daily.) 180 tablet 1   calcium carbonate (TUMS - DOSED IN MG ELEMENTAL CALCIUM) 500 MG chewable tablet Chew 1 tablet by mouth daily.     cholecalciferol  (VITAMIN D3) 25 MCG (1000 UNIT) tablet Take 1,000 Units by mouth daily.     Cyanocobalamin (VITAMIN B-12 PO) Take 1,000 mcg by mouth daily.     dexamethasone (DECADRON) 4 MG tablet Take 10 tablets (40 mg total) by mouth every 7 (seven) days. Take 40 mg by mouth once a day in the morning of chemo on Wednesdays- or unless otherwise instructed 40 tablet 2   ELIQUIS 5 MG TABS tablet TAKE ONE TABLET BY MOUTH TWICE A DAY 60 tablet 6   fluticasone (FLONASE) 50 MCG/ACT nasal spray Place 1  spray into both nostrils daily as needed for allergies or rhinitis.     gabapentin (NEURONTIN) 300 MG capsule Take 2 capsules (600 mg total) by mouth 2 (two) times daily. 360 capsule 1   hydrALAZINE (APRESOLINE) 50 MG tablet Take 1.5 tablets (75 mg total) by mouth 3 (three) times daily. (Patient taking differently: Take 100 mg by mouth 3 (three) times daily. 100 mg in the morning, 75 mg afternoon, 100 mg at night) 135 tablet 3   irbesartan (AVAPRO) 150 MG tablet Take 1 tablet (150 mg total) by mouth daily. 30 tablet 5   metolazone (ZAROXOLYN) 2.5 MG tablet Take 1 tablet (2.5 mg total) by mouth every other day.     metoprolol succinate (TOPROL-XL) 50 MG 24 hr tablet Take 1/2 to 1 as needed up to twice a day for AFIB and heart rate above 100. 90 tablet 3   Omega-3 Fatty Acids (FISH OIL PO) Take 1 Capful by mouth daily.     polyethylene glycol powder (GLYCOLAX/MIRALAX) 17 GM/SCOOP powder Take 1 Container by mouth daily.     potassium chloride SA (KLOR-CON M) 20 MEQ tablet Take 3 tablets (60 mEq total) by mouth 3 (three) times daily. Take an extra 2 tablets on the day you take the Metolazone (80 meq total)     sodium chloride (OCEAN) 0.65 % SOLN nasal spray Place 1 spray into both nostrils as needed for congestion.     spironolactone (ALDACTONE) 25 MG tablet Take 0.5 tablets (12.5 mg total) by mouth daily.     TART CHERRY PO Take 1 tablet by mouth at bedtime.     torsemide (DEMADEX) 20 MG tablet Take 2 tablets (40 mg total) by  mouth daily. 90 tablet 3   No current facility-administered medications for this visit.    REVIEW OF SYSTEMS:   Constitutional: ( - ) fevers, ( - )  chills , ( - ) night sweats Eyes: ( - ) blurriness of vision, ( - ) double vision, ( - ) watery eyes Ears, nose, mouth, throat, and face: ( - ) mucositis, ( - ) sore throat Respiratory: ( - ) cough, ( - ) dyspnea, ( - ) wheezes Cardiovascular: ( - ) palpitation, ( - ) chest discomfort, ( - ) lower extremity swelling Gastrointestinal:  ( - ) nausea, ( - ) heartburn, ( - ) change in bowel habits Skin: ( - ) abnormal skin rashes Lymphatics: ( - ) new lymphadenopathy, ( - ) easy bruising Neurological: ( - ) numbness, ( - ) tingling, ( - ) new weaknesses Behavioral/Psych: ( - ) mood change, ( - ) new changes  All other systems were reviewed with the patient and are negative.  PHYSICAL EXAMINATION: ECOG PERFORMANCE STATUS: 2 - Symptomatic, <50% confined to bed  Vitals:   01/26/23 1037  BP: (!) 132/52  Pulse: (!) 58  Resp: 16  Temp: 98 F (36.7 C)  SpO2: 98%      Filed Weights   01/26/23 1037  Weight: 254 lb 14.4 oz (115.6 kg)       GENERAL: Well-appearing elderly Caucasian male, alert, no distress and comfortable SKIN: no overt abnormalities of the skin.  EYES: conjunctiva are pink and non-injected, sclera clear LUNGS: clear to auscultation and percussion with normal breathing effort HEART: tachycardic &  no murmurs. +bilateral lower extremity edema Musculoskeletal: no cyanosis of digits and no clubbing  PSYCH: alert & oriented x 3, fluent speech NEURO: no focal motor/sensory deficits  LABORATORY DATA:  I  have reviewed the data as listed    Latest Ref Rng & Units 01/26/2023   10:20 AM 01/19/2023   12:49 PM 01/12/2023    9:45 AM  CBC  WBC 4.0 - 10.5 K/uL 9.8  13.4  13.0   Hemoglobin 13.0 - 17.0 g/dL 86.5  78.4  69.6   Hematocrit 39.0 - 52.0 % 36.1  37.3  37.3   Platelets 150 - 400 K/uL 161  257  216        Latest  Ref Rng & Units 01/26/2023   10:20 AM 01/19/2023   12:49 PM 01/12/2023    9:45 AM  CMP  Glucose 70 - 99 mg/dL 295  284  132   BUN 8 - 23 mg/dL 33  31  35   Creatinine 0.61 - 1.24 mg/dL 4.40  1.02  7.25   Sodium 135 - 145 mmol/L 141  141  139   Potassium 3.5 - 5.1 mmol/L 2.9  4.1  3.1   Chloride 98 - 111 mmol/L 102  105  97   CO2 22 - 32 mmol/L 30  29  34   Calcium 8.9 - 10.3 mg/dL 9.2  9.7  9.0   Total Protein 6.5 - 8.1 g/dL 6.1  6.5  6.0   Total Bilirubin 0.3 - 1.2 mg/dL 0.5  0.5  0.4   Alkaline Phos 38 - 126 U/L 72  74  71   AST 15 - 41 U/L 18  18  15    ALT 0 - 44 U/L 19  18  18      Lab Results  Component Value Date   MPROTEIN Not Observed 12/29/2022   MPROTEIN Not Observed 12/02/2022   MPROTEIN Not Observed 11/03/2022   Lab Results  Component Value Date   KPAFRELGTCHN 97.8 (H) 12/29/2022   KPAFRELGTCHN 94.2 (H) 12/02/2022   KPAFRELGTCHN 44.5 (H) 11/03/2022   LAMBDASER 15.9 12/29/2022   LAMBDASER 20.3 12/02/2022   LAMBDASER 13.5 11/03/2022   KAPLAMBRATIO 6.15 (H) 12/29/2022   KAPLAMBRATIO 4.64 (H) 12/02/2022   KAPLAMBRATIO 3.30 (H) 11/03/2022     RADIOGRAPHIC STUDIES: CT Lumbar Spine W Contrast  Result Date: 01/06/2023 CLINICAL DATA:  Hematologic malignancy, assess treatment response EXAM: CT THORACIC AND LUMBAR SPINE WITH CONTRAST TECHNIQUE: Multidetector CT imaging of the lumbar and thoracic spine was performed with intravenous contrast. Multiplanar CT image reconstructions were also generated. RADIATION DOSE REDUCTION: This exam was performed according to the departmental dose-optimization program which includes automated exposure control, adjustment of the mA and/or kV according to patient size and/or use of iterative reconstruction technique. CONTRAST:  OMNIPAQUE IOHEXOL 300 MG/ML  SOLN COMPARISON:  CT thoracic and lumbar spine October 25, 2022. FINDINGS: CT THORACIC SPINE FINDINGS Alignment: Normal. Vertebrae: No substantial change in numerous lytic lesions  throughout the thoracic spine, most conspicuous at T10. Postsurgical changes of T1-T4 fusion and posterior decompression with T2 corpectomy. Similar positioning. Subtle chronic pathologic fracture involving the inferior endplate of T10, unchanged. No evidence of the fracture. Bridging anterior endplate osteophytes spanning from T3-T11 compatible with diffuse idiopathic skeletal hyperostosis. Paraspinal and other soft tissues: No acute findings. Disc levels: Similar degenerative change. CT LUMBAR SPINE FINDINGS Segmentation: Standard. Alignment: No substantial sagittal subluxation. Vertebrae: Vertebral heights are maintained at the lumbar spine. Numerous lytic lesions throughout the lumbar spine and visualized pelvis appears similar. Dominant lesions involve the pedicles at L1-L2, similar. No evidence of new fracture. Paraspinal and other soft tissues: No acute abnormality visualized. Aorta bi-iliac atherosclerosis. Disc levels: Similar  degenerative changes. IMPRESSION: Similar a numerable lytic lesions throughout the thoracic and lumbar spine and imaged pelvis, compatible with reported history of multiple myeloma. No evidence of superimposed interval acute abnormality. Electronically Signed   By: Feliberto Harts M.D.   On: 01/06/2023 10:00   CT THORACIC SPINE W CONTRAST  Result Date: 01/06/2023 CLINICAL DATA:  Hematologic malignancy, assess treatment response EXAM: CT THORACIC AND LUMBAR SPINE WITH CONTRAST TECHNIQUE: Multidetector CT imaging of the lumbar and thoracic spine was performed with intravenous contrast. Multiplanar CT image reconstructions were also generated. RADIATION DOSE REDUCTION: This exam was performed according to the departmental dose-optimization program which includes automated exposure control, adjustment of the mA and/or kV according to patient size and/or use of iterative reconstruction technique. CONTRAST:  OMNIPAQUE IOHEXOL 300 MG/ML  SOLN COMPARISON:  CT thoracic and lumbar  spine October 25, 2022. FINDINGS: CT THORACIC SPINE FINDINGS Alignment: Normal. Vertebrae: No substantial change in numerous lytic lesions throughout the thoracic spine, most conspicuous at T10. Postsurgical changes of T1-T4 fusion and posterior decompression with T2 corpectomy. Similar positioning. Subtle chronic pathologic fracture involving the inferior endplate of T10, unchanged. No evidence of the fracture. Bridging anterior endplate osteophytes spanning from T3-T11 compatible with diffuse idiopathic skeletal hyperostosis. Paraspinal and other soft tissues: No acute findings. Disc levels: Similar degenerative change. CT LUMBAR SPINE FINDINGS Segmentation: Standard. Alignment: No substantial sagittal subluxation. Vertebrae: Vertebral heights are maintained at the lumbar spine. Numerous lytic lesions throughout the lumbar spine and visualized pelvis appears similar. Dominant lesions involve the pedicles at L1-L2, similar. No evidence of new fracture. Paraspinal and other soft tissues: No acute abnormality visualized. Aorta bi-iliac atherosclerosis. Disc levels: Similar degenerative changes. IMPRESSION: Similar a numerable lytic lesions throughout the thoracic and lumbar spine and imaged pelvis, compatible with reported history of multiple myeloma. No evidence of superimposed interval acute abnormality. Electronically Signed   By: Feliberto Harts M.D.   On: 01/06/2023 10:00    ASSESSMENT & PLAN Travis [name] is a 72 y.o. male with medical history significant for IgA Kappa Multiple Myeloma who presents for a follow up visit.   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  He meets diagnostic criteria by having a serum free light chain ratio greater than 100 and lytic lesions causing pathological fracture of the humerus.  Given this I would recommend we pursued Darzalex, Revlimid, and dexamethasone.  Given his  advanced age I would not recommend transplantation, that this is something we can consider when it comes time to transition to maintenance therapy versus transplant.  The patient voices understanding of the plan moving forward.  # IgA Kappa Multiple Myeloma --Metastatic survey completed 12/15/2021 --Patient underwent fixation of his humerus on 12/17/2021, pathology confirmed plasmacytoma/plasma cell neoplasm.  --Received 6 cycles of Darzalex, Revlimid, and dexamethasone started on 12/28/2021-05/18/2022.  --Due to worsening myeloma osseous lesions including pathologic fracture at T2 with cord compression, Dr. Leonides Schanz recommends changing therapy to Velcade/Pomalyst/Dex.  Plan: --Due for Cycle 1, Day 15 of Carfilzomib/Dex today --labs from today showed white blood cell 9.8, hemoglobin 11.9, MCV 92.3, and platelets of 161 --Most recent myeloma labs from 12/29/2022 with SPEP and SFLC showed M protein not detectable. Kappa 97.8, Lambda 15.9, Ratio 6.15 (increasing) --Proceed with treatment today without any dose modifications.  --RTC in 2 weeks with interval weekly treatments.   #Neck pain and mid back pain: --Suspect pain is exacerbated from recent hospitalization and  being in bed/sitting for long periods.  --MRI C/T/L spine obtinaed that showed unchanged lytic lesions in the right C7 inferior articular process,T6, T8, and T10  vertebral bodies, as well  as the T11 spinous process. Unchanged  multilevel cervical spondylsis, Interval enlargement of disc herniation at L3-L4 with progressive spinal canal stenosis. No new or progressive lytic lesions.  --Encouraged to continue with physical therapy and follow up with ortho.   #Palliative Radiation Therapy: # Dysphagia --Received palliative radiation from 01/06/2022-01/19/2022 to right hymerus, right forearm and right chest/rib, 20 Gy in 10 Fx.  --recieved palliative radiation to surgical bed in the upper thoracic spine area on 07/13/2022.  27 Gy in 9 Fx.    #Lower extremity edema: --Currently on lasix and torsemide with minimal improvement. Advised to follow up with cardiologist to discuss dose or medication changes.   # Rash Concerning for Shingles-resolved -- Prescribed valacyclovir 3 times daily 1000 mg x 10 days-completed the course -- We will continue to monitor rash closely.  #DVT: --Found to have acute DVT involving left peroneal veins. Likely secondary to recent surgery and hospitalization --Currently on eliquis 5 mg BID.    #Hypokalemia: --Currently on potassium chloride 40 mEq twice a day as prescribed but takes three times a day (60 mEq) on the days he takes torsemide/spironolactone --Potassium level is 2.9 today. Advised to continue with 40 mEq twice daily and increase to 60 mEq   #Rash on scalp and right forearm: --Recommend topical hydrocortisone cream for spot treatment  #Supportive Care -- chemotherapy education complete -- port placement not required -- zofran 8mg  q8H PRN and compazine 10mg  PO q6H for nausea -- acyclovir 400mg  PO BID for VCZ prophylaxis -- Started Zometa q 12 weeks on 11/10/2022. Next dose Oct 2024.   No orders of the defined types were placed in this encounter.  All questions were answered. The patient knows to call the clinic with any problems, questions or concerns.  A total of more than 30 minutes were spent on this encounter with face-to-face time and non-face-to-face time, including preparing to see the patient, ordering tests and/or medications, counseling the patient and coordination of care as outlined above.   Ulysees Barns, MD Department of Hematology/Oncology [facility] Phone: [phone] Pager: [phone] Email: [email or web address]  01/27/2023 9:12 AM

## 2023-01-26 NOTE — Progress Notes (Signed)
Pt's potassium 2.9 mmol/L today. This RN reached out to Dr. Leonides Schanz regarding intervention/recommendations for Pt. Dr. Leonides Schanz recommended Pt receive IV potassium today, this RN informed Pt. Pt refused to stay for IV potassium and stated, "I will continue taking my oral potassium at home and will drink Gatorade". This RN made Dr. Leonides Schanz aware. This RN provided education to Pt and spouse on the s/s of hypokalemia and re-educated to take oral potassium as prescribed. Pt and spouse both verbalized understanding.

## 2023-01-26 NOTE — Progress Notes (Signed)
Patient states he took Dexamethasone at home prior to treatment today.

## 2023-01-26 NOTE — Patient Instructions (Signed)
[facility]  Discharge Instructions: Thank you for choosing [facility] to provide your oncology and hematology care.   If you have a lab appointment with the Cancer Center, please go directly to the Cancer Center and check in at the registration area.   Wear comfortable clothing and clothing appropriate for easy access to any Portacath or PICC line.   We strive to give you quality time with your provider. You may need to reschedule your appointment if you arrive late (15 or more minutes).  Arriving late affects you and other patients whose appointments are after yours.  Also, if you miss three or more appointments without notifying the office, you may be dismissed from the clinic at the provider's discretion.      For prescription refill requests, have your pharmacy contact our office and allow 72 hours for refills to be completed.    Today you received the following chemotherapy and/or immunotherapy agents: Kyprolis.       To help prevent nausea and vomiting after your treatment, we encourage you to take your nausea medication as directed.  BELOW ARE SYMPTOMS THAT SHOULD BE REPORTED IMMEDIATELY: *FEVER GREATER THAN 100.4 F (38 C) OR HIGHER *CHILLS OR SWEATING *NAUSEA AND VOMITING THAT IS NOT CONTROLLED WITH YOUR NAUSEA MEDICATION *UNUSUAL SHORTNESS OF BREATH *UNUSUAL BRUISING OR BLEEDING *URINARY PROBLEMS (pain or burning when urinating, or frequent urination) *BOWEL PROBLEMS (unusual diarrhea, constipation, pain near the anus) TENDERNESS IN MOUTH AND THROAT WITH OR WITHOUT PRESENCE OF ULCERS (sore throat, sores in mouth, or a toothache) UNUSUAL RASH, SWELLING OR PAIN  UNUSUAL VAGINAL DISCHARGE OR ITCHING   Items with * indicate a potential emergency and should be followed up as soon as possible or go to the Emergency Department if any problems should occur.  Please show the CHEMOTHERAPY ALERT CARD or IMMUNOTHERAPY ALERT CARD at  check-in to the Emergency Department and triage nurse.  Should you have questions after your visit or need to cancel or reschedule your appointment, please contact [facility]  Dept: [phone]  and follow the prompts.  Office hours are 8:00 a.m. to 4:30 p.m. Monday - Friday. Please note that voicemails left after 4:00 p.m. may not be returned until the following business day.  We are closed weekends and major holidays. You have access to a nurse at all times for urgent questions. Please call the main number to the clinic Dept: [phone] and follow the prompts.   For any non-urgent questions, you may also contact your provider using MyChart. We now offer e-Visits for anyone 12 and older to request care online for non-urgent symptoms. For details visit [email or web address].   Also download the MyChart app! Go to the app store, search "MyChart", open the app, select [place], and log in with your MyChart username and password.

## 2023-01-26 NOTE — Telephone Encounter (Signed)
I checked on the labs done with Oncology today and his K was 2.9 Creat 1.42... Per [employer]... pt to take KCL 60 meq TID with the added on Metolazone days as he has been taking it.   He does not have Oncology labs next week so he will have them redrawn here on Monday 01/31/23 STAT.   His BNP is pending.

## 2023-01-27 ENCOUNTER — Encounter: Payer: Self-pay | Admitting: Hematology and Oncology

## 2023-01-27 ENCOUNTER — Ambulatory Visit: Payer: Medicare Other | Admitting: Physician Assistant

## 2023-01-27 ENCOUNTER — Ambulatory Visit: Payer: Medicare Other | Attending: Physician Assistant | Admitting: Physician Assistant

## 2023-01-27 ENCOUNTER — Encounter: Payer: Self-pay | Admitting: Physician Assistant

## 2023-01-27 ENCOUNTER — Ambulatory Visit: Payer: Medicare Other

## 2023-01-27 ENCOUNTER — Other Ambulatory Visit: Payer: Self-pay | Admitting: Internal Medicine

## 2023-01-27 VITALS — BP 150/60 | HR 64 | Ht 70.0 in | Wt 261.0 lb

## 2023-01-27 DIAGNOSIS — I10 Essential (primary) hypertension: Secondary | ICD-10-CM | POA: Diagnosis not present

## 2023-01-27 DIAGNOSIS — I5030 Unspecified diastolic (congestive) heart failure: Secondary | ICD-10-CM

## 2023-01-27 DIAGNOSIS — R001 Bradycardia, unspecified: Secondary | ICD-10-CM

## 2023-01-27 DIAGNOSIS — I5033 Acute on chronic diastolic (congestive) heart failure: Secondary | ICD-10-CM | POA: Diagnosis not present

## 2023-01-27 DIAGNOSIS — I4891 Unspecified atrial fibrillation: Secondary | ICD-10-CM | POA: Diagnosis not present

## 2023-01-27 DIAGNOSIS — E876 Hypokalemia: Secondary | ICD-10-CM

## 2023-01-27 DIAGNOSIS — I480 Paroxysmal atrial fibrillation: Secondary | ICD-10-CM

## 2023-01-27 LAB — BASIC METABOLIC PANEL
BUN/Creatinine Ratio: 32 — ABNORMAL HIGH (ref 10–24)
BUN: 64 mg/dL — ABNORMAL HIGH (ref 8–27)
CO2: 25 mmol/L (ref 20–29)
Calcium: 9.7 mg/dL (ref 8.6–10.2)
Chloride: 101 mmol/L (ref 96–106)
Creatinine, Ser: 2.02 mg/dL — ABNORMAL HIGH (ref 0.76–1.27)
Glucose: 165 mg/dL — ABNORMAL HIGH (ref 70–99)
Potassium: 3.6 mmol/L (ref 3.5–5.2)
Sodium: 139 mmol/L (ref 134–144)
eGFR: 35 mL/min/{1.73_m2} — ABNORMAL LOW (ref >59)

## 2023-01-27 LAB — MISC LABCORP TEST (SEND OUT): Labcorp test code: 143000

## 2023-01-27 MED ORDER — ENTRESTO 49-51 MG PO TABS: 1.0000 | ORAL_TABLET | Freq: Two times a day (BID) | ORAL | 11 refills | Status: DC

## 2023-01-27 MED ORDER — SPIRONOLACTONE 25 MG PO TABS: 25.0000 mg | ORAL_TABLET | Freq: Every day | ORAL | Status: DC

## 2023-01-27 MED ORDER — HYDRALAZINE HCL 50 MG PO TABS: 50.0000 mg | ORAL_TABLET | Freq: Three times a day (TID) | ORAL | 3 refills | Status: DC

## 2023-01-27 NOTE — Telephone Encounter (Signed)
Travis Reichert, MD  Bertram Millard, RN; Cv Div [facility] Triage2 hours ago (8:54 AM)    It may be best to bring him into the office today for a stat BMET and BNP and to be seen by someone in the office (PA or DOD) since he is very tenuous  ___________________________________________________________  Travis [name] and spoke w patient. He will come this afternoon to [facility] office and see APP and have stat labs.  He verifies he is now taking three 20 meq potassium TID.  If he can,he will come to lab earlier than appointment, and get the labs.

## 2023-01-27 NOTE — Progress Notes (Deleted)
Cardiology Office Note:    Date:  01/27/2023   ID:  WRYDER KEIZER, DOB February 13, 1951, MRN [id number]  PCP:  [name], MD   [facility] Providers Cardiologist:  [name], MD Electrophysiologist:  Lanier Prude, MD  Sleep Medicine:  Armanda Magic, MD {  Referring MD: [name], *   No chief complaint on file. ***  History of Present Illness:    KAHLIL KIRN is a 72 y.o. male with a hx of hypertension, OSA, PAF s/p A-fib ablation 2018, chronic diastolic heart failure without evidence of CAD on coronary CTA in 2018, history of DVT, peripheral neuropathy, and multiple myeloma.  He was hospitalized in 2023 in [place] found to have A-fib/flutter and hypervolemia.  He was diuresed and converted to sinus rhythm.  Beta-blocker was stopped due to bradycardia but later resumed.  He establish care with heart care cardiology.  He was treated with flecainide, but was found to be back in A-fib in December 2023.  Flecainide was discontinued and amiodarone was started.  He has followed with EP who adjusted his amiodarone dose to 200 mg daily.  Antihypertensives have been titrated.  He was hospitalized March 2024 with pneumonia found to be in A-fib and treated with IV amiodarone while in the hospital.  His diuretic regimen has been complicated by electrolyte disturbances.  He was recently seen by Dr. [name] in clinic 01/17/2023.  Due to hypokalemia, spironolactone 25 mg was started with the plan to reduce Zaroxolyn use.  He called our office on 01/20/2023 with elevated BP reporting systolic BP 200.  He was also feeling short of breath and reported possibly retaining fluid.  He was recently seen in office with Dr. [name] and metolazone every other day was stopped in the setting of hypokalemia.  Spironolactone 25 mg daily was started.  He is also taking hydralazine, which has been increased. He was advised to take extra torsemide and metolazone to decrease fluid and increase UOP.    He has a history of multiple myeloma following with oncology.  Yesterday on routine lab work he was found to have a potassium of 2.9.  IV potassium was recommended, but the patient did not want to stay for this.  He presents today for follow up.                 Past Medical History:  Diagnosis Date   A-fib [facility])    Arthritis    "comes and goes; mostly in hands, occasionally elbow" (04/05/2017)   Complication of anesthesia    "just wanted to sleep alot  for couple days S/P VARICOCELE OR" (04/05/2017)   DVT (deep venous thrombosis) (HCC)    LLE   Dysrhythmia    PVCs    GERD (gastroesophageal reflux disease)    History of hiatal hernia    History of radiation therapy    Thoracic Spine- 07/13/22-07/23/22-Dr. Antony Blackbird   Hypertension    Impaired glucose tolerance    Multiple myeloma (HCC)    Obesity (BMI 30-39.9) 07/26/2016   OSA on CPAP 07/26/2016   Mild with AHI 12.5/hr now on CPAP at 14cm H2O   PAF (paroxysmal atrial fibrillation) (HCC)    s/p PVI Ablation in 2018 // Flecainide Rx // Echo 8/21: EF 60-65, no RWMA, mild LVH, normal RV SF, moderate RVE, mild BAE, trivial MR, mild dilation of aortic root (40 mm)   Pneumonia    "may have had walking pneumonia a few years ago" (04/05/2017)  Pre-diabetes    Thrombophlebitis     Past Surgical History:  Procedure Laterality Date   ATRIAL FIBRILLATION ABLATION  04/05/2017   ATRIAL FIBRILLATION ABLATION N/A 04/05/2017   Procedure: ATRIAL FIBRILLATION ABLATION;  Surgeon: [name], MD;  [facility]: [facility];  Service: Cardiovascular;  Laterality: N/A;   BONE BIOPSY Right 12/17/2021   Procedure: BONE BIOPSY;  Surgeon: [name], MD;  Location: [facility];  Service: Orthopedics;  Laterality: Right;   COMPLETE RIGHT HIP REPLACMENT  01/19/2019   EVALUATION UNDER ANESTHESIA WITH HEMORRHOIDECTOMY N/A 02/24/2017   Procedure: EXAM UNDER ANESTHESIA WITH  HEMORRHOIDECTOMY;  Surgeon: [name], MD;   Location: [facility];  Service: General;  Laterality: N/A;   EXCISIONAL TOTAL HIP ARTHROPLASTY WITH ANTIBIOTIC SPACERS Right 09/25/2020   Procedure: EXCISIONAL TOTAL HIP ARTHROPLASTY WITH ANTIBIOTIC SPACERS;  Surgeon: [name], MD;  Location: [facility];  Service: Orthopedics;  Laterality: Right;   FLEXIBLE SIGMOIDOSCOPY N/A 04/15/2015   Procedure:  UNSEDATED FLEXIBLE SIGMOIDOSCOPY;  Surgeon: [name], MD;  Location: [facility];  Service: Endoscopy;  Laterality: N/A;   HUMERUS IM NAIL Right 12/17/2021   Procedure: INTRAMEDULLARY (IM) NAIL HUMERAL;  Surgeon: [name], MD;  Location: [facility];  Service: Orthopedics;  Laterality: Right;   KNEE ARTHROSCOPY Left ~ 2016   POSTERIOR CERVICAL FUSION/FORAMINOTOMY N/A 06/04/2022   Procedure: Thoracic one - Thoracic Four Posterior lateral arthrodesis/ fusion and Thoracic two Corpectomy;  Surgeon: [name], MD;  Location: [facility];  Service: Neurosurgery;  Laterality: N/A;   REIMPLANTATION OF TOTAL HIP Right 12/25/2020   Procedure: REIMPLANTATION/REVISION OF RIGHT TOTAL HIP VERSUS REPEAT IRRIGATION AND DEBRIDEMENT;  Surgeon: [name], MD;  Location: [facility];  Service: Orthopedics;  Laterality: Right;   TESTICLE SURGERY  1988   VARICOCELE   TONSILLECTOMY     VARICOSE VEIN SURGERY Bilateral     Current Medications: No outpatient medications have been marked as taking for the 01/27/23 encounter (Appointment) with Marcelino Duster, PA.     Allergies:   Linezolid and Vancomycin   Social History   Socioeconomic History   Marital status: Married    Spouse name: Not on file   Number of children: 1   Years of education: law school   Highest education level: Not on file  Occupational History   Occupation: lawyer  Tobacco Use   Smoking status: Never   Smokeless tobacco: Never  Vaping Use   Vaping status: Never Used  Substance and Sexual Activity   Alcohol use: Yes    Alcohol/week: 2.0 standard drinks of alcohol     Types: 2 Glasses of wine per week    Comment: 2 glasses of wine a week, occ bourbon, beer   Drug use: No   Sexual activity: Not Currently  Other Topics Concern   Not on file  Social History Narrative   Lives in [place] with spouse in a 2 story home.  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 1 daughter.     Education: Social worker school.    Social Determinants of Health   Financial Resource Strain: Not on file  Food Insecurity: No Food Insecurity (09/27/2022)   Hunger Vital Sign    Worried About Running Out of Food in the Last Year: Never true    Ran Out of Food in the Last Year: Never true  Transportation Needs: No Transportation Needs (09/27/2022)   PRAPARE - Transportation    Lack of  Transportation (Medical): No    Lack of Transportation (Non-Medical): No  Physical Activity: Not on file  Stress: Not on file  Social Connections: Unknown (09/15/2021)   [facility]   Social Network    Social Network: Not on file     Family History: The patient's ***family history includes Atrial fibrillation in his father; Dementia in his maternal grandmother and mother; Hypertension in his father and paternal grandfather; Stroke in his father.  ROS:   Please see the history of present illness.    *** All other systems reviewed and are negative.  EKGs/Labs/Other Studies Reviewed:    The following studies were reviewed today: ***      Recent Labs: 10/03/2022: Magnesium 1.9 11/10/2022: NT-Pro BNP 760; TSH 0.880 12/08/2022: B Natriuretic Peptide 452.6 01/26/2023: ALT 19; BUN 33; Creatinine 1.42; Hemoglobin 11.9; Platelet Count 161; Potassium 2.9; Sodium 141  Recent Lipid Panel No results found for: "CHOL", "TRIG", "HDL", "CHOLHDL", "VLDL", "LDLCALC", "LDLDIRECT"   Risk Assessment/Calculations:   {Does this patient have ATRIAL FIBRILLATION?:[phone]}            Physical Exam:    VS:  There were no vitals taken for this visit.    Wt  Readings from Last 3 Encounters:  01/26/23 254 lb 14.4 oz (115.6 kg)  01/19/23 259 lb 8 oz (117.7 kg)  01/17/23 258 lb (117 kg)     GEN: *** Well nourished, well developed in no acute distress HEENT: Normal NECK: No JVD; No carotid bruits LYMPHATICS: No lymphadenopathy CARDIAC: ***RRR, no murmurs, rubs, gallops RESPIRATORY:  Clear to auscultation without rales, wheezing or rhonchi  ABDOMEN: Soft, non-tender, non-distended MUSCULOSKELETAL:  No edema; No deformity  SKIN: Warm and dry NEUROLOGIC:  Alert and oriented x 3 PSYCHIATRIC:  Normal affect   ASSESSMENT:    No diagnosis found. PLAN:    In order of problems listed above:  ***      {Are you ordering a CV Procedure (e.g. stress test, cath, DCCV, TEE, etc)?   Press F2        :[id number]}    Medication Adjustments/Labs and Tests Ordered: Current medicines are reviewed at length with the patient today.  Concerns regarding medicines are outlined above.  No orders of the defined types were placed in this encounter.  No orders of the defined types were placed in this encounter.   There are no Patient Instructions on file for this visit.   Signed, Marcelino Duster, [place]  01/27/2023 12:21 PM    [facility]

## 2023-01-27 NOTE — Telephone Encounter (Signed)
Oncology offered the pt to have IV K yesterday at his appt but he declined waiting to have it and left their office.   Also, see Lillia Abed Robert's note from 01/20/23. His Spironolactone was decreased to 12.5 mg.    Will route to Dr Mayford Knife. Not sure if BNP was drawn. Pt coming in Monday 01/31/23 for labs... oral K increased last night. See previous note.

## 2023-01-27 NOTE — Patient Instructions (Addendum)
Medication Instructions:  Stop irbesartan  Increase spironolactone to 25 mg daily Start entresto 49-51 mg twice daily *If you need a refill on your cardiac medications before your next appointment, please call your pharmacy*  Lab Work: BMET, BNP in 1 week If you have labs (blood work) drawn today and your tests are completely normal, you will receive your results only by: MyChart Message (if you have MyChart) OR A paper copy in the mail If you have any lab test that is abnormal or we need to change your treatment, we will call you to review the results.  Follow-Up: At [facility], you and your health needs are our priority.  As part of our continuing mission to provide you with exceptional heart care, we have created designated Provider Care Teams.  These Care Teams include your primary Cardiologist (physician) and Advanced Practice Providers (APPs -  Physician Assistants and Nurse Practitioners) who all work together to provide you with the care you need, when you need it.  Your next appointment:   1 month(s)  Provider:   [name], MD  or APP   Other Instructions Weigh every morning after using the restroom, before breakfast and call and let us know if you have a weight gain of 2 lbs or more overnight or 5 lbs or more in a week.   Check your blood pressure daily, 1 hr after morning medications for 2 weeks, keep a log and send [place] the readings through mychart at the end of the 2 weeks.   Heart-Healthy Eating Plan Many factors influence your heart health, including eating and exercise habits. Heart health is also called coronary health. Coronary risk increases with abnormal blood fat (lipid) levels. A heart-healthy eating plan includes limiting unhealthy fats, increasing healthy fats, limiting salt (sodium) intake, and making other diet and lifestyle changes. What is my plan? Your health care provider may recommend that: You limit your fat intake to _________% or less of your  total calories each day. You limit your saturated fat intake to _________% or less of your total calories each day. You limit the amount of cholesterol in your diet to less than _________ mg per day. You limit the amount of sodium in your diet to less than _________ mg per day. What are tips for following this plan? Cooking Cook foods using methods other than frying. Baking, boiling, grilling, and broiling are all good options. Other ways to reduce fat include: Removing the skin from poultry. Removing all visible fats from meats. Steaming vegetables in water or broth. Meal planning  At meals, imagine dividing your plate into fourths: Fill one-half of your plate with vegetables and green salads. Fill one-fourth of your plate with whole grains. Fill one-fourth of your plate with lean protein foods. Eat 2-4 cups of vegetables per day. One cup of vegetables equals 1 cup (91 g) broccoli or cauliflower florets, 2 medium carrots, 1 large bell pepper, 1 large sweet potato, 1 large tomato, 1 medium white potato, 2 cups (150 g) raw leafy greens. Eat 1-2 cups of fruit per day. One cup of fruit equals 1 small apple, 1 large banana, 1 cup (237 g) mixed fruit, 1 large orange,  cup (82 g) dried fruit, 1 cup (240 mL) 100% fruit juice. Eat more foods that contain soluble fiber. Examples include apples, broccoli, carrots, beans, peas, and barley. Aim to get 25-30 g of fiber per day. Increase your consumption of legumes, nuts, and seeds to 4-5 servings per week. One  serving of dried beans or legumes equals  cup (90 g) cooked, 1 serving of nuts is  oz (12 almonds, 24 pistachios, or 7 walnut halves), and 1 serving of seeds equals  oz (8 g). Fats Choose healthy fats more often. Choose monounsaturated and polyunsaturated fats, such as olive and canola oils, avocado oil, flaxseeds, walnuts, almonds, and seeds. Eat more omega-3 fats. Choose salmon, mackerel, sardines, tuna, flaxseed oil, and ground flaxseeds.  Aim to eat fish at least 2 times each week. Check food labels carefully to identify foods with trans fats or high amounts of saturated fat. Limit saturated fats. These are found in animal products, such as meats, butter, and cream. Plant sources of saturated fats include palm oil, palm kernel oil, and coconut oil. Avoid foods with partially hydrogenated oils in them. These contain trans fats. Examples are stick margarine, some tub margarines, cookies, crackers, and other baked goods. Avoid fried foods. General information Eat more home-cooked food and less restaurant, buffet, and fast food. Limit or avoid alcohol. Limit foods that are high in added sugar and simple starches such as foods made using white refined flour (white breads, pastries, sweets). Lose weight if you are overweight. Losing just 5-10% of your body weight can help your overall health and prevent diseases such as diabetes and heart disease. Monitor your sodium intake, especially if you have high blood pressure. Talk with your health care provider about your sodium intake. Try to incorporate more vegetarian meals weekly. What foods should I eat? Fruits All fresh, canned (in natural juice), or frozen fruits. Vegetables Fresh or frozen vegetables (raw, steamed, roasted, or grilled). Green salads. Grains Most grains. Choose whole wheat and whole grains most of the time. Rice and pasta, including brown rice and pastas made with whole wheat. Meats and other proteins Lean, well-trimmed beef, veal, pork, and lamb. Chicken and [place] without skin. All fish and shellfish. Wild duck, rabbit, pheasant, and venison. Egg whites or low-cholesterol egg substitutes. Dried beans, peas, lentils, and tofu. Seeds and most nuts. Dairy Low-fat or nonfat cheeses, including ricotta and mozzarella. Skim or 1% milk (liquid, powdered, or evaporated). Buttermilk made with low-fat milk. Nonfat or low-fat yogurt. Fats and oils Non-hydrogenated (trans-free)  margarines. Vegetable oils, including soybean, sesame, sunflower, olive, avocado, peanut, safflower, corn, canola, and cottonseed. Salad dressings or mayonnaise made with a vegetable oil. Beverages Water (mineral or sparkling). Coffee and tea. Unsweetened ice tea. Diet beverages. Sweets and desserts Sherbet, gelatin, and fruit ice. Small amounts of dark chocolate. Limit all sweets and desserts. Seasonings and condiments All seasonings and condiments. The items listed above may not be a complete list of foods and beverages you can eat. Contact a dietitian for more options. What foods should I avoid? Fruits Canned fruit in heavy syrup. Fruit in cream or butter sauce. Fried fruit. Limit coconut. Vegetables Vegetables cooked in cheese, cream, or butter sauce. Fried vegetables. Grains Breads made with saturated or trans fats, oils, or whole milk. Croissants. Sweet rolls. Donuts. High-fat crackers, such as cheese crackers and chips. Meats and other proteins Fatty meats, such as hot dogs, ribs, sausage, bacon, rib-eye roast or steak. High-fat deli meats, such as salami and bologna. Caviar. Domestic duck and goose. Organ meats, such as liver. Dairy Cream, sour cream, cream cheese, and creamed cottage cheese. Whole-milk cheeses. Whole or 2% milk (liquid, evaporated, or condensed). Whole buttermilk. Cream sauce or high-fat cheese sauce. Whole-milk yogurt. Fats and oils Meat fat, or shortening. Cocoa butter, hydrogenated oils, palm oil, coconut  oil, palm kernel oil. Solid fats and shortenings, including bacon fat, salt pork, lard, and butter. Nondairy cream substitutes. Salad dressings with cheese or sour cream. Beverages Regular sodas and any drinks with added sugar. Sweets and desserts Frosting. Pudding. Cookies. Cakes. Pies. Milk chocolate or white chocolate. Buttered syrups. Full-fat ice cream or ice cream drinks. The items listed above may not be a complete list of foods and beverages to avoid.  Contact a dietitian for more information. Summary Heart-healthy meal planning includes limiting unhealthy fats, increasing healthy fats, limiting salt (sodium) intake and making other diet and lifestyle changes. Lose weight if you are overweight. Losing just 5-10% of your body weight can help your overall health and prevent diseases such as diabetes and heart disease. Focus on eating a balance of foods, including fruits and vegetables, low-fat or nonfat dairy, lean protein, nuts and legumes, whole grains, and heart-healthy oils and fats. This information is not intended to replace advice given to you by your health care provider. Make sure you discuss any questions you have with your health care provider. Document Revised: 05/25/2021 Document Reviewed: 05/25/2021 Elsevier Patient Education  2024 [employer] Low-Sodium Eating Plan Salt (sodium) helps you keep a healthy balance of fluids in your body. Too much sodium can raise your blood pressure. It can also cause fluid and waste to be held in your body. Your health care provider or dietitian may recommend a low-sodium eating plan if you have high blood pressure (hypertension), kidney disease, liver disease, or heart failure. Eating less sodium can help lower your blood pressure and reduce swelling. It can also protect your heart, liver, and kidneys. What are tips for following this plan? Reading food labels  Check food labels for the amount of sodium per serving. If you eat more than one serving, you must multiply the listed amount by the number of servings. Choose foods with less than 140 milligrams (mg) of sodium per serving. Avoid foods with 300 mg of sodium or more per serving. Always check how much sodium is in a product, even if the label says "unsalted" or "no salt added." Shopping  Buy products labeled as "low-sodium" or "no salt added." Buy fresh foods. Avoid canned foods and pre-made or frozen meals. Avoid canned, cured, or  processed meats. Buy breads that have less than 80 mg of sodium per slice. Cooking  Eat more home-cooked food. Try to eat less restaurant, buffet, and fast food. Try not to add salt when you cook. Use salt-free seasonings or herbs instead of table salt or sea salt. Check with your provider or pharmacist before using salt substitutes. Cook with plant-based oils, such as canola, sunflower, or olive oil. Meal planning When eating at a restaurant, ask if your food can be made with less salt or no salt. Avoid dishes labeled as brined, pickled, cured, or smoked. Avoid dishes made with soy sauce, miso, or teriyaki sauce. Avoid foods that have monosodium glutamate (MSG) in them. MSG may be added to some restaurant food, sauces, soups, bouillon, and canned foods. Make meals that can be grilled, baked, poached, roasted, or steamed. These are often made with less sodium. General information Try to limit your sodium intake to 1,500-2,300 mg each day, or the amount told by your provider. What foods should I eat? Fruits Fresh, frozen, or canned fruit. Fruit juice. Vegetables Fresh or frozen vegetables. "No salt added" canned vegetables. "No salt added" tomato sauce and paste. Low-sodium or reduced-sodium tomato and vegetable juice. Grains Low-sodium cereals,  such as oats, puffed wheat and rice, and shredded wheat. Low-sodium crackers. Unsalted rice. Unsalted pasta. Low-sodium bread. Whole grain breads and whole grain pasta. Meats and other proteins Fresh or frozen meat, poultry, seafood, and fish. These should have no added salt. Low-sodium canned tuna and salmon. Unsalted nuts. Dried peas, beans, and lentils without added salt. Unsalted canned beans. Eggs. Unsalted nut butters. Dairy Milk. Soy milk. Cheese that is naturally low in sodium, such as ricotta cheese, fresh mozzarella, or Swiss cheese. Low-sodium or reduced-sodium cheese. Cream cheese. Yogurt. Seasonings and condiments Fresh and dried herbs  and spices. Salt-free seasonings. Low-sodium mustard and ketchup. Sodium-free salad dressing. Sodium-free light mayonnaise. Fresh or refrigerated horseradish. Lemon juice. Vinegar. Other foods Homemade, reduced-sodium, or low-sodium soups. Unsalted popcorn and pretzels. Low-salt or salt-free chips. The items listed above may not be all the foods and drinks you can have. Talk to a dietitian to learn more. What foods should I avoid? Vegetables Sauerkraut, pickled vegetables, and relishes. Olives. [place] fries. Onion rings. Regular canned vegetables, except low-sodium or reduced-sodium items. Regular canned tomato sauce and paste. Regular tomato and vegetable juice. Frozen vegetables in sauces. Grains Instant hot cereals. Bread stuffing, pancake, and biscuit mixes. Croutons. Seasoned rice or pasta mixes. Noodle soup cups. Boxed or frozen macaroni and cheese. Regular salted crackers. Self-rising flour. Meats and other proteins Meat or fish that is salted, canned, smoked, spiced, or pickled. Precooked or cured meat, such as sausages or meat loaves. Tomasa Blase. Ham. Pepperoni. Hot dogs. Corned beef. Chipped beef. Salt pork. Jerky. Pickled herring, anchovies, and sardines. Regular canned tuna. Salted nuts. Dairy Processed cheese and cheese spreads. Hard cheeses. Cheese curds. Blue cheese. Feta cheese. String cheese. Regular cottage cheese. Buttermilk. Canned milk. Fats and oils Salted butter. Regular margarine. Ghee. Bacon fat. Seasonings and condiments Onion salt, garlic salt, seasoned salt, table salt, and sea salt. Canned and packaged gravies. Worcestershire sauce. Tartar sauce. Barbecue sauce. Teriyaki sauce. Soy sauce, including reduced-sodium soy sauce. Steak sauce. Fish sauce. Oyster sauce. Cocktail sauce. Horseradish that you find on the shelf. Regular ketchup and mustard. Meat flavorings and tenderizers. Bouillon cubes. Hot sauce. Pre-made or packaged marinades. Pre-made or packaged taco seasonings.  Relishes. Regular salad dressings. Salsa. Other foods Salted popcorn and pretzels. Corn chips and puffs. Potato and tortilla chips. Canned or dried soups. Pizza. Frozen entrees and pot pies. The items listed above may not be all the foods and drinks you should avoid. Talk to a dietitian to learn more. This information is not intended to replace advice given to you by your health care provider. Make sure you discuss any questions you have with your health care provider. Document Revised: 05/06/2022 Document Reviewed: 05/06/2022 Elsevier Patient Education  2024 [employer].

## 2023-01-27 NOTE — Therapy (Signed)
OUTPATIENT PHYSICAL THERAPY ONCOLOGY TREATMENT  Patient Name: Travis [name] MRN: [id number] DOB:March 20, 1951, 72 y.o., male Today's Date: 01/28/2023   END OF SESSION:  PT End of Session - 01/28/23 1405     Visit Number 30    Number of Visits 37    Date for PT Re-Evaluation 02/18/23    PT Start Time 1300    PT Stop Time 1345    PT Time Calculation (min) 45 min    Activity Tolerance Patient tolerated treatment well    Behavior During Therapy [facility] for tasks assessed/performed                        Past Medical History:  Diagnosis Date   A-fib (HCC)    Arthritis    "comes and goes; mostly in hands, occasionally elbow" (04/05/2017)   Complication of anesthesia    "just wanted to sleep alot  for couple days S/P VARICOCELE OR" (04/05/2017)   DVT (deep venous thrombosis) (HCC)    LLE   Dysrhythmia    PVCs    GERD (gastroesophageal reflux disease)    History of hiatal hernia    History of radiation therapy    Thoracic Spine- 07/13/22-07/23/22-Dr. Antony Blackbird   Hypertension    Impaired glucose tolerance    Multiple myeloma (HCC)    Obesity (BMI 30-39.9) 07/26/2016   OSA on CPAP 07/26/2016   Mild with AHI 12.5/hr now on CPAP at 14cm H2O   PAF (paroxysmal atrial fibrillation) (HCC)    s/p PVI Ablation in 2018 // Flecainide Rx // Echo 8/21: EF 60-65, no RWMA, mild LVH, normal RV SF, moderate RVE, mild BAE, trivial MR, mild dilation of aortic root (40 mm)   Pneumonia    "may have had walking pneumonia a few years ago" (04/05/2017)   Pre-diabetes    Thrombophlebitis    Past Surgical History:  Procedure Laterality Date   ATRIAL FIBRILLATION ABLATION  04/05/2017   ATRIAL FIBRILLATION ABLATION N/A 04/05/2017   Procedure: ATRIAL FIBRILLATION ABLATION;  Surgeon: [name], MD;  Location: [facility];  Service: Cardiovascular;  Laterality: N/A;   BONE BIOPSY Right 12/17/2021   Procedure: BONE BIOPSY;  Surgeon: [name], MD;  Location: [facility];  Service: Orthopedics;  Laterality: Right;   COMPLETE RIGHT HIP REPLACMENT  01/19/2019   EVALUATION UNDER ANESTHESIA WITH HEMORRHOIDECTOMY N/A 02/24/2017   Procedure: EXAM UNDER ANESTHESIA WITH  HEMORRHOIDECTOMY;  Surgeon: [name], MD;  Location: [facility];  Service: General;  Laterality: N/A;   EXCISIONAL TOTAL HIP ARTHROPLASTY WITH ANTIBIOTIC SPACERS Right 09/25/2020   Procedure: EXCISIONAL TOTAL HIP ARTHROPLASTY WITH ANTIBIOTIC SPACERS;  Surgeon: [name], MD;  Location: [facility];  Service: Orthopedics;  Laterality: Right;   FLEXIBLE SIGMOIDOSCOPY N/A 04/15/2015   Procedure:  UNSEDATED FLEXIBLE SIGMOIDOSCOPY;  Surgeon: [name], MD;  Location: [facility];  Service: Endoscopy;  Laterality: N/A;   HUMERUS IM NAIL Right 12/17/2021   Procedure: INTRAMEDULLARY (IM) NAIL HUMERAL;  Surgeon: [name], MD;  Location: [facility];  Service: Orthopedics;  Laterality: Right;   KNEE ARTHROSCOPY Left ~ 2016   POSTERIOR CERVICAL FUSION/FORAMINOTOMY N/A 06/04/2022   Procedure: Thoracic one - Thoracic Four Posterior lateral arthrodesis/ fusion and Thoracic two Corpectomy;  Surgeon: [name], MD;  Location: [facility];  Service: Neurosurgery;  Laterality: N/A;   REIMPLANTATION OF TOTAL HIP Right 12/25/2020   Procedure: REIMPLANTATION/REVISION OF RIGHT TOTAL HIP VERSUS REPEAT IRRIGATION AND  DEBRIDEMENT;  Surgeon: [name], MD;  Location: [facility];  Service: Orthopedics;  Laterality: Right;   TESTICLE SURGERY  1988   VARICOCELE   TONSILLECTOMY     VARICOSE VEIN SURGERY Bilateral    Patient Active Problem List   Diagnosis Date Noted   Pancreatic cyst 09/30/2022   Infection due to novel influenza A virus 09/29/2022   Persistent atrial fibrillation (HCC) 07/15/2022   Acute on chronic diastolic heart failure (HCC) 07/03/2022   Pulmonary infiltrates 07/01/2022   Acute respiratory failure with hypoxia (HCC) 07/01/2022   Hyponatremia 06/29/2022   History of thoracic  surgery 06/29/2022   Acute DVT of left tibial vein (HCC) 06/21/2022   Hypokalemia 06/21/2022   Vitamin D deficiency 05/30/2022   Other constipation 05/29/2022   Pathologic compression fracture of spine, initial encounter (HCC) 05/29/2022   Cord compression (HCC) 05/28/2022   Degenerative cervical disc 05/14/2022   Hypercoagulable state due to persistent atrial fibrillation (HCC) 01/11/2022   Multiple myeloma (HCC) 12/10/2021   Medication monitoring encounter 02/27/2021   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   Status post peripherally inserted central catheter (PICC) central line placement 11/06/2020   Drug rash with eosinophilia and systemic symptoms syndrome 11/06/2020   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   PAF (paroxysmal atrial fibrillation) (HCC)    GERD (gastroesophageal reflux disease) 03/29/2018   Hiatal hernia 03/29/2018   History of DVT (deep vein thrombosis) 03/29/2018   CAP (community acquired pneumonia) 03/29/2018   Osteoarthritis 03/29/2018   Neuropathy 08/29/2017   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   Paroxysmal atrial fibrillation with rapid ventricular response (HCC) 04/05/2017   Prolapsed internal hemorrhoids, grade 4, s/p ligation/pexy/hemorrhoidectomy x 2 02/24/2017 02/24/2017   OSA (obstructive sleep apnea) 07/26/2016   Obesity (BMI 30-39.9) 07/26/2016   Atrial fibrillation with RVR (HCC)    Essential hypertension    Chest pain at rest     PCP: Eleanora Neighbor, MD  REFERRING PROVIDER: Antony Blackbird, MD  REFERRING DIAG: Multiple Myeloma  THERAPY DIAG:  Compression fracture of thoracic vertebra, unspecified thoracic vertebral level, initial encounter (HCC)  Multiple myeloma not having achieved remission (HCC)  Muscle weakness (generalized)  Abnormal posture  Neck pain  Other lack of coordination  Chronic right shoulder pain  ONSET DATE: 2023  Rationale for Evaluation and Treatment: Rehabilitation  SUBJECTIVE:                                                                                                                                                                                            SUBJECTIVE STATEMENT: Went to [place] for grandson bday. Did well. Some  BP medication changes and taking higher dose of Potasium. Neck doing well, some stiffness today but no pain.  PERTINENT HISTORY:  Travis [name] 72 y.o. male with medical history significant for IgA Kappa Multiple Myeloma. He was diagnosed with smouldering myeloma in 2018 . Last July he developed right arm pain due to a pathologic fx and he underwent radiation and fixation. In early Feb 2024 he had radiation for pathologic T2 compression fx followed by a T1-T4 fusion. PAIN:  Are you having pain?  PAIN:  Are you having pain? No just stiff NPRS scale:   Pain location: Left greater than right LB Pain orientation: Left  PAIN TYPE: aching Pain description: constant  Aggravating factors: standing,rising from chair Relieving factors: recliner, lying down,  PRECAUTIONS: Multiple Myeloma with mets to R ribs, R humerus with ORIF for pathological fracture 12/17/21. Lesions have also been found in R rib and also in his skull. Pathologic fx T2 with fusion T1-4 on 06/04/22, Bilateral LE edema (on Lasix), Afib   WEIGHT BEARING RESTRICTIONS: No  FALLS:  Has patient fallen in last 6 months? Yes. Number of falls 1  LIVING ENVIRONMENT: Lives with: lives with their family and lives with their spouse Lives in: House/apartment Stairs: Yes; Internal: 14 steps; on left going up and External: 3 steps; on right going up Has following equipment at home: Single point cane and Walker - 4 wheeled  OCCUPATION: attorney/ mostly retired?  LEISURE: golf (can't do now.), use to play racquetball  HAND DOMINANCE: right   PRIOR LEVEL OF FUNCTION: Independent  POSTURE:significant slump sitting posture with forward head  PATIENT GOALS: decrease pain,get  stronger,improve balance   OBJECTIVE:  COGNITION: Overall cognitive status: Within functional limits for tasks assessed   PALPATION: Very tender bilateral UT/levator, scapular regions  OBSERVATIONS / OTHER ASSESSMENTS: very flexed head and neck with chin nearly at chest  SENSATION: Light touch: Appears intact    POSTURE: significant forward head, round shoulders, increased kyphosis   Gait holding single point cane in left hand but not using. Right shoulder low, flexed neck;difficulty raising head to see where he is going  UPPER EXTREMITY AROM/PROM:  A/PROM RIGHT   eval  RIGHT 11/08/2022 RIGHT 12/20/2022  Shoulder extension     Shoulder flexion 91 with compensation 101 with compensation  101 with compensation  Shoulder abduction 67 with compensation 75 with compensation 75 compensation  Shoulder internal rotation     Shoulder external rotation C6      (Blank rows = not tested)  A/PROM LEFT   eval  Shoulder extension   Shoulder flexion 123  Shoulder abduction 112  Shoulder internal rotation T7  Shoulder external rotation t3    (Blank rows = not tested)  CERVICAL AROM: All within normal limits:    Percent limited 11/08/22 12/20/2022  Flexion Fully flexed    Extension From flexed position decreased 80% Decreased 70% Decreased 40%  Right lateral flexion     Left lateral flexion     Right rotation Dec 50% sitting in FH posture  Dec 50% from forward head posture  Left rotation        Dec 50%  Decreased 50% from forward head posture    UPPER EXTREMITY STRENGTH:    LOWER EXTREMITY AROM/PROM:  A/PROM Right eval  Hip flexion   Hip extension   Hip abduction   Hip adduction   Hip internal rotation   Hip external rotation   Knee flexion   Knee extension   Ankle dorsiflexion  Ankle plantarflexion   Ankle inversion   Ankle eversion    (Blank rows = not tested)  A/PROM LEFT eval  Hip flexion   Hip extension   Hip abduction   Hip adduction   Hip internal  rotation   Hip external rotation   Knee flexion   Knee extension   Ankle dorsiflexion   Ankle plantarflexion   Ankle inversion   Ankle eversion    (Blank rows = not tested)  LOWER EXTREMITY MMT:    10/04/2022  4 position balance test; able to hold DLS feet together , and half tandem stance 10 seconds. Unable to maintain full tandem stance for 10 sec, unable to hold SLS greater than a second or 2 on left, and 4 seconds on right.     LYMPHEDEMA ASSESSMENTS:   SURGERY TYPE/DATE: 8/17/23Left prox humerus fixation for pathologic fx  2/2/2024Fusion T1-T4 for pathologic fx with cord compression 06/04/22 posterior cervical fusion/foraminotomy;T1-4 fusion NUMBER OF LYMPH NODES REMOVED: NA  CHEMOTHERAPY: Velcade/promalyst/Dex;  RADIATION:to prox humerus and thoracic spine pathologic fx  HORMONE TREATMENT: NA  INFECTIONS: NA  FUNCTIONAL TESTS:  10/09/2022;30 second sit to stand 10 with heavy use of arms  GAIT: Distance walked: 20 Assistive device utilized: carried SPC in left Level of assistance: Complete Independence Comments: feels unsteady, very flexed neck posture   QUICK DASH SURVEY:    TODAY'S TREATMENT:                                                                                                                                         DATE:   01/28/23:Pt seen for aquatic therapy today.  Treatment took place in water 3.5-4.75 ft in depth at the [facility] pool. Temp of water was 91.  Pt entered/exited the pool via stairs with bilat rail independently. Pt requires buoyancy of water for support and to offload joints with strengthening exercises.Pt utilizes viscosity of the water required for strengthening.   Seated water bench with 75% submersion Pt performed seated LE AROM exercises 20x in all planes, with concurrent discussion of his current health status and pain assessment. 75% depth water walking with single buoy UE wts for push/pull 10 lengths added ankle fins  for added resisatnce. Hip 3 way kicks 20x Bil holding onto wall for balance with VC to push water a bit faster ankle fins for more resistance. Small marching across pool holding single buoy floats under water lengths f/f tandem walking holding floats 4x across pool.  Single leg press with small noodle 2x20 Bil with balance assist. Horizontal float with concurrent skin rolling on upper back and lateral sway for trunk stretching by PTA.   01/19/23:Pt seen for aquatic therapy today.  Treatment took place in water 3.5-4.75 ft in depth at the [facility] pool. Temp of water was 91.  Pt entered/exited the pool via stairs with bilat rail independently. Pt requires buoyancy of water  for support and to offload joints with strengthening exercises.Pt utilizes viscosity of the water required for strengthening.   Seated water bench with 75% submersion Pt performed seated LE AROM exercises 20x in all planes, with concurrent discussion of his current health status and pain assessment. 75% depth water walking with single buoy UE wts for push/pull 10 lengths added ankle fins for added resisatnce. Hip 3 way kicks 15x Bil holding onto wall for balance with VC to push water a bit faster ankle fins for more resistance. Small marching across pool holding single buoy floats under water lengths f/f tandem walking holding floats 4x across pool.  Single leg press with small noodle 2x20 Bil with balance assist. Horizontal float with concurrent skin rolling on upper back and lateral sway for trunk stretching by PTA.  01/17/2023 Nu step seat 12, UE 8, Level 5 x 10 min, 862 steps, O2 sats 95, HR 63 Tandem stance x 2 B to failure,   Vector reaches x 10 B,    SLS 4 times ea side to failure Mini squats 2 x 10 Walking on toes 4 lengths intermittent hand hold Marching with slow walk to end of bars 4 lengths intermittent HH, rest Shoulder ladder x 5 for flexion Lateral band walks with yellow band 4 laps in bars, hip flexion with  knee ext 2 x 10, hip ext 2 x 10 all with yellow with HH Sit to stand x 10 with intermittent use of  hands;seated rest Ambulated 707 ft with SBA and single point cane. Good pace, no LOB. Seated rest.  PATIENT EDUCATION:  Education details: sitting posture with lumbar roll Person educated: Patient Education method: Medical illustrator Education comprehension: verbalized understanding and returned demonstration  HOME EXERCISE PROGRAM: Doing HEP from HHPT; cervical ROM, shoulder rolls, shrugs, scap retraction, UT stretches  Aquatic Access Code: [email or web address] URL: [email or web address] Date: 11/09/2022 Prepared by: [facility] - [address] This aquatic home exercise program from Med[employer] utilizes pictures from land based exercises, but has been adapted prior to lamination and issuance.    ASSESSMENT:  CLINICAL IMPRESSION:Pt arrives to aquatic PT with no neck or shoulder pain, just stiffness. He plans to join the pool so in late October he can resume his exercises when he returns from [place]. No pain in the pool.  OBJECTIVE IMPAIRMENTS: decreased activity tolerance, decreased balance, difficulty walking, decreased ROM, decreased strength, increased edema, impaired UE functional use, postural dysfunction, and pain.   ACTIVITY LIMITATIONS: carrying, lifting, dressing, reach over head, and locomotion level  PARTICIPATION LIMITATIONS: cleaning, laundry, driving, and occupation  PERSONAL FACTORS:  Multiple Myeloma  are also affecting patient's functional outcome.   REHAB POTENTIAL: Good  CLINICAL DECISION MAKING: Evolving/moderate complexity  EVALUATION COMPLEXITY: Moderate  GOALS: Goals reviewed with patient? Yes  SHORT TERM GOALS: Target date: 10/21/22  Pt will be educated in and independent in proper sitting posture using lumbar roll prn Baseline: Goal status; MET 11/08/22 2.  Pt will report decreased muscle soreness in bilateral upper back x  25% Baseline:  Goal status: ONGOING  3.  Pt will be compliant with a HEP for postural strengthening/ROM Baseline:  Goal status: MET / LONG TERM GOALS: Target date: 12/20/22  Pt will be able to bring his head to neutral position for safety with ambulation/driving Baseline:  Goal status:Ongoing( but improved) 2.  Pt will have decreased upper back pain by 50% or more Baseline:  Goal status:Ongoing 3.  Pt will be able to flex right shoulder to 115  degrees with minimal compensation for improved reaching ability Baseline:  Goal status: ONGOING 4; Pt will be able to maintain tandem stance on both sides x 10 sec to demonstrated improved balance. Goal status:MET right foot forward 11/08/2022, MET both legs 12/20/2022  5. Pt will be able to perform sit to stand x 5 without use of Ue's  Baseline:must use hands  Goal status:MET 12/20/2022 but difficult 6.  Pt will be able to maintain SLS on ea leg x 10 seconds without LOB  GOAL: ONGOING  PT FREQUENCY: 2x/week  PT DURATION: 6 weeks  PLANNED INTERVENTIONS: Therapeutic exercises, Therapeutic activity, Neuromuscular re-education, Balance training, Gait training, Patient/Family education, Self Care, Joint mobilization, Aquatic Therapy, Manual therapy, and Re-evaluation  PLAN FOR NEXT SESSION: Cont land and aquatic exercises working on posture, strength, balance, hold STM secondary to no longer having lasting benefit  Ane Payment, PTA 01/28/23 2:06 PM   01/28/23 2:06 PM

## 2023-01-27 NOTE — Progress Notes (Signed)
Cardiology Office Note:  .   Date:  01/27/2023  ID:  Travis [name], DOB 22-Nov-1950, MRN [id number] PCP: [name], MD  [facility] Providers Cardiologist:  [name], MD Electrophysiologist:  Lanier Prude, MD  Sleep Medicine:  Armanda Magic, MD {  History of Present Illness: .   Travis [name] is a 72 y.o. male with a past medical history of PAF (on Eliquis) status post PVI ablation 2018, hypertension, OSA, prior DVT, monoclonal gammopathy (smoldering myeloma), peripheral neuropathy who presents today for follow-up appointment.   Was last seen by [name], NP 10/20/2022.  Had been followed by Dr. [name] for many years for management of hypertension and atrial fibrillation.  Completed a Myoview 10/17 that was normal with no evidence of ischemia.  Diagnosed with atrial fibrillation in 2017 and underwent PVI ablation by Dr. [name] 2018.  Also completed cardiac CTA at that time showing no evidence of calcium.  Had an echocardiogram 2019 that showed LVEF 65% with no RWMA, normal diastolic function with aortic root measuring 40 mm.  Treated with Darzalex, Revlimid, and dexamethasone 12/28/2021 for multiple myeloma.  Seen in the AF clinic on 01/2022 for management of A-fib and plan for outpatient cardioversion however patient spontaneously converted.  Developed AF with RVR 03/2022 while in [place] and required diuresis.  He was seen 04/2022 for follow-up and was back in atrial flutter with no resolution with metoprolol.  He was started on amiodarone and metoprolol was cut back.  Referred to Dr. Lalla Brothers for discussion of treatment options 05/12/2022.  Amiodarone was reduced to 200 mg daily and blood pressure was noted to be above goal.  Hydralazine was increased to 37.53 times a day.  Seen in the ED 06/2022 with AF RVR after suffering a fall.  2D echo was completed with LVEF 60 to 65% with grade 2 DD.  Patient in normal sinus rhythm at that time.  Worsening dyspnea after being  discharged and was brought back to the ER via EMS.  CT scan was completed showing no evidence of PE but did reveal bilateral pneumonia with small bilateral pleural effusion.  Found to be in AF with RVR on arrival with acute CHF exacerbation.  Was loaded with amnio and diuresed.  Was seen in AF clinic 07/15/2022 with plan to pursue DCCV if sinus rhythm was not maintained with amiodarone.  He was seen by Dr. [name] 07/29/2022 and was maintaining sinus rhythm therefore DCCV was canceled.  He was seen by Dr. Ross/9/24 for evaluation of lower extremity edema.  He reported poor response to Lasix which was increased to 80 mg daily.  Was maintained on Eliquis and amiodarone.  Had addition of metolazone 2.5 mg to help with diuresis.  Contacted office shortly after complaining of elevated systolic blood pressure.  Hydralazine was increased to 50/25/50 mg daily and then switch to 75 mg 3 times daily.  He presented for follow-up with [name], NP few weeks ago.  Was seen for elevated blood pressures.  Seen in the office and patient reported experiencing elevated blood pressures with systolic readings in the 160s to 170s.  Some shortness of breath and slightly volume up during that exam.  Compliant with current medications no adverse reactions.  Denied any new supplements or sodium indiscretions in his diet.  He does note some discomfort since recovering from thoracic spine surgery.  Blood pressure was 174/82 and recheck of blood pressure was not completed until after patient's exam.  Denied any  headaches or vision changes.  Denied chest pain, palpitations, dyspnea, PND, orthopnea, nausea, vomiting, dizziness, syncope, weight gain, or early satiety.  He saw me 11/05/22, he tells me that he was not in a week and then it broke.  He has sinus bradycardia when he is out of A-fib before but typically does not last long.  Heart rate is 45 bpm today.  Patient states that he could barely get out of his chair (he went for a pedicure  this morning).  He is extremely weak and tired.  Also states that he feels fluid overloaded today.  He was taking metoprolol 50 mg daily a few days in a row since he was in A-fib and then when he converted his heart rate went down to the 40s and did not recover.  He also is having issues with his back and has a herniated disc that is affecting his arms and legs.  He has follow-up for this scheduled.  Unfortunately, the patient and his wife were headed out of the country tomorrow on a cruise around [place] and we have advised that they do not travel in his current condition.  We discussed his most recent echocardiogram.  We also discussed getting a ZIO monitor as well as increasing his diuretics for short time to get his fluid level under control.  He was seen by Dr. [name] a few weeks ago and at that time he was encouraged to take spironolactone 25 mg daily and decrease his Zaroxolyn to 1.25 mg every other day.  He is currently on 80 mEq every other day and 60 mEq on the other days of potassium for hypokalemia.  Last potassium was 2.9 yesterday.  It was redrawn today and results were not available to view quite yet.  He still takes some steroids before chemo which probably is not helping with his fluid status.  He states he was recently in [place] for his grandsons birthday.  He turned 1.  They had a good time.  We discussed his spironolactone and that he needs to continue that at 25 mg daily and continue half a tab of metolazone every other day.  His Demadex did not change today and he remains on 40 mg daily.  His weight has been all over the place.  States he can gain 3 pounds 2 days in a row and then lose 3 pounds 2 days in a row quite easily.  No shortness of breath today.  No chest pain.  Reports no shortness of breath nor dyspnea on exertion. Reports no chest pain, pressure, or tightness. No edema, orthopnea, PND. Reports no palpitations.    ROS: Pertinent ROS in HPI  Studies Reviewed: .        Echocardiogram 09/08/2022 IMPRESSIONS     1. Left ventricular ejection fraction, by estimation, is 60 to 65%. The  left ventricle has normal function. The left ventricle has no regional  wall motion abnormalities. There is severe concentric left ventricular  hypertrophy. Left ventricular diastolic   parameters are indeterminate.   2. Right ventricular systolic function is normal. The right ventricular  size is mildly enlarged. There is normal pulmonary artery systolic  pressure.   3. Left atrial size was severely dilated.   4. Right atrial size was mildly dilated.   5. The mitral valve is normal in structure. No evidence of mitral valve  regurgitation. No evidence of mitral stenosis.   6. The aortic valve is normal in structure. Aortic valve regurgitation is  not visualized. No aortic stenosis is present.   7. There is mild dilatation of the ascending aorta, measuring 37 mm.   8. The inferior vena cava is dilated in size with <50% respiratory  variability, suggesting right atrial pressure of 15 mmHg.   FINDINGS   Left Ventricle: Left ventricular ejection fraction, by estimation, is 60  to 65%. The left ventricle has normal function. The left ventricle has no  regional wall motion abnormalities. The global longitudinal strain is  normal despite suboptimal segment  tracking. The left ventricular internal cavity size was normal in size.  There is severe concentric left ventricular hypertrophy. Left ventricular  diastolic parameters are indeterminate.   Right Ventricle: The right ventricular size is mildly enlarged. No  increase in right ventricular wall thickness. Right ventricular systolic  function is normal. There is normal pulmonary artery systolic pressure.  The tricuspid regurgitant velocity is 2.12   m/s, and with an assumed right atrial pressure of 8 mmHg, the estimated  right ventricular systolic pressure is 26.0 mmHg.   Left Atrium: Left atrial size was severely dilated.    Right Atrium: Right atrial size was mildly dilated.   Pericardium: There is no evidence of pericardial effusion. Presence of  epicardial fat layer.   Mitral Valve: The mitral valve is normal in structure. No evidence of  mitral valve regurgitation. No evidence of mitral valve stenosis.   Tricuspid Valve: The tricuspid valve is normal in structure. Tricuspid  valve regurgitation is trivial. No evidence of tricuspid stenosis.   Aortic Valve: The aortic valve is normal in structure. Aortic valve  regurgitation is not visualized. No aortic stenosis is present.   Pulmonic Valve: The pulmonic valve was not well visualized. Pulmonic valve  regurgitation is trivial. No evidence of pulmonic stenosis.   Aorta: There is mild dilatation of the ascending aorta, measuring 37 mm.   Venous: The inferior vena cava is dilated in size with less than 50%  respiratory variability, suggesting right atrial pressure of 15 mmHg.   IAS/Shunts: No atrial level shunt detected by color flow Doppler.       Physical Exam:   VS:  BP (!) 150/60   Pulse 64   Ht 5\' 10"  (1.778 m)   Wt 261 lb (118.4 kg)   SpO2 96%   BMI 37.45 kg/m    Wt Readings from Last 3 Encounters:  01/27/23 261 lb (118.4 kg)  01/26/23 254 lb 14.4 oz (115.6 kg)  01/19/23 259 lb 8 oz (117.7 kg)    GEN: Well nourished, well developed in no acute distress NECK: No JVD; No carotid bruits CARDIAC:sinus bradycardia, no murmurs, rubs, gallops RESPIRATORY:  Clear to auscultation without rales, wheezing or rhonchi  ABDOMEN: Soft, non-tender, distended EXTREMITIES:  No edema; No deformity   ASSESSMENT AND PLAN: .   1.  Sinus bradycardia -he is currently 64 bpm today -He is back on Metoprolol and tolerating -ZIO monitor reviewed with the patient  2. Essential hypertension -Blood pressure stable today, 150/60 and has been higher at home -Continue current medications which include hydralazine 100 mg 3 times a day  Zaroxolyn 1.25 mg every  other day, potassium 60 mEq TID  and an extra 20 mEq when he takes metolazone  2.  Paroxysmal atrial fibrillation -NSR today -Advised to continue amiodarone and metoprolol -zio monitor reviewed with about 50% of the time in Afib -Not symptomatic at this time  3.  HFpEF/Hypokalemia -Fluid overloaded today (mostly abdominal and weight gain) -Daily weights advised -  hypokalemia and on replacement 60 MEQ and 80 MEQ when he takes his zaroxolyn  -repeat potassium today -Encourage lower extremity compression -continue spironolactone 25 mg daily and Zaroxolyn 1.25 mg every other day -continue Demadex 40 mg daily  4.  Smoldering multiple myeloma -Sees Dr. Leonides Schanz for heme/onc -He is on steroids prior to his chemo  5.  OSA -CPAP nightly -he sleeps well      Dispo: follow-up 1 month with APP  Signed, Sharlene Dory, PA-C

## 2023-01-28 ENCOUNTER — Encounter: Payer: Self-pay | Admitting: Physical Therapy

## 2023-01-28 ENCOUNTER — Ambulatory Visit: Payer: Medicare Other | Admitting: Physical Therapy

## 2023-01-28 DIAGNOSIS — M25511 Pain in right shoulder: Secondary | ICD-10-CM | POA: Diagnosis not present

## 2023-01-28 DIAGNOSIS — M542 Cervicalgia: Secondary | ICD-10-CM | POA: Diagnosis not present

## 2023-01-28 DIAGNOSIS — R278 Other lack of coordination: Secondary | ICD-10-CM

## 2023-01-28 DIAGNOSIS — R293 Abnormal posture: Secondary | ICD-10-CM | POA: Diagnosis not present

## 2023-01-28 DIAGNOSIS — G8929 Other chronic pain: Secondary | ICD-10-CM | POA: Diagnosis not present

## 2023-01-28 DIAGNOSIS — C9000 Multiple myeloma not having achieved remission: Secondary | ICD-10-CM | POA: Diagnosis not present

## 2023-01-28 DIAGNOSIS — S22000A Wedge compression fracture of unspecified thoracic vertebra, initial encounter for closed fracture: Secondary | ICD-10-CM | POA: Diagnosis not present

## 2023-01-28 DIAGNOSIS — M6281 Muscle weakness (generalized): Secondary | ICD-10-CM | POA: Diagnosis not present

## 2023-01-28 LAB — PRO B NATRIURETIC PEPTIDE: NT-Pro BNP: 665 pg/mL — ABNORMAL HIGH (ref 0–376)

## 2023-01-30 ENCOUNTER — Encounter: Payer: Self-pay | Admitting: Hematology and Oncology

## 2023-01-31 ENCOUNTER — Telehealth: Payer: Self-pay

## 2023-01-31 MED ORDER — HYDRALAZINE HCL 50 MG PO TABS: 100.0000 mg | ORAL_TABLET | Freq: Three times a day (TID) | ORAL | Status: DC

## 2023-01-31 NOTE — Telephone Encounter (Addendum)
I spoke with the pt and sorted through some if the pts med issues with the PharmD... I need to clarify the med changes made by the APP last week to have an updated med list... the pt did not start Entresto until after the labwork was done showing the Creat of 2.02.   Pt says he is taking Hydralazine 100 mg am, 75 mg afternoon, and 100 mg pm.   Per research of the Pharm D... pt started an upward trend of his Creat and low K on 01/12/23 after he started the Oncology treatment Kyprolis... which can result in Low K, elev Creat, and edema.   I will forward to [facility] for review of his labs and help in updating his med list and further recommendation and I will forward to Dr Leonides Schanz his Oncologist,   Dr [name] set to return early next week.. pt having labs again with Oncology next Wed 02/09/23.   For now... the pt will take Hydralazine 100 mg TID and no Entresto.

## 2023-02-02 ENCOUNTER — Ambulatory Visit: Payer: Medicare Other

## 2023-02-02 ENCOUNTER — Ambulatory Visit: Payer: Medicare Other | Attending: Radiation Oncology

## 2023-02-02 ENCOUNTER — Other Ambulatory Visit: Payer: Medicare Other

## 2023-02-02 DIAGNOSIS — R293 Abnormal posture: Secondary | ICD-10-CM | POA: Diagnosis not present

## 2023-02-02 DIAGNOSIS — M6281 Muscle weakness (generalized): Secondary | ICD-10-CM | POA: Insufficient documentation

## 2023-02-02 DIAGNOSIS — G8929 Other chronic pain: Secondary | ICD-10-CM | POA: Insufficient documentation

## 2023-02-02 DIAGNOSIS — M542 Cervicalgia: Secondary | ICD-10-CM | POA: Diagnosis not present

## 2023-02-02 DIAGNOSIS — C9000 Multiple myeloma not having achieved remission: Secondary | ICD-10-CM | POA: Diagnosis not present

## 2023-02-02 DIAGNOSIS — M25511 Pain in right shoulder: Secondary | ICD-10-CM | POA: Insufficient documentation

## 2023-02-02 DIAGNOSIS — R278 Other lack of coordination: Secondary | ICD-10-CM | POA: Insufficient documentation

## 2023-02-02 DIAGNOSIS — S22000A Wedge compression fracture of unspecified thoracic vertebra, initial encounter for closed fracture: Secondary | ICD-10-CM | POA: Insufficient documentation

## 2023-02-02 NOTE — Telephone Encounter (Signed)
LM for the pt to check and see how he is doing so I can talk with the APP about his meds.

## 2023-02-02 NOTE — Telephone Encounter (Signed)
Pt says he already has lab appt tomorrow so he will keep it unless otherwise advised.     Thank you so much Anastaisa Wooding. Sounds like he is on the correct dose of diuretics. I think we get some follow-up labs checking his kidney's and if creatinine is trending down we can consider addition of Entresto. He will need to keep a BP log for [place] and if BP is staying elevated, please give [place] a call.  I have attached Dr. Mayford Knife to let her know about the timing of labs and his chemo medications and to see if she has anything further to add.  Thanks! Sharlene Dory, PA-C

## 2023-02-02 NOTE — Telephone Encounter (Signed)
I called to follow up with the pt and see how he is doing and he reports that his weight has been stable, with no edema in his lower extremities, hands or belly... he says his ankles are "skinny"... he also says his BP fluctuates... today it is 140/71, yesterday 130/76, but the day before it peaked at 170 systolic.   I will send to Tess for her review.

## 2023-02-02 NOTE — Therapy (Signed)
OUTPATIENT PHYSICAL THERAPY ONCOLOGY TREATMENT  Patient Name: Travis [name] MRN: [id number] DOB:[birth year], 72 y.o., male Today's Date: 02/02/2023   END OF SESSION:  PT End of Session - 02/02/23 1110     Visit Number 31    Number of Visits 37    Date for PT Re-Evaluation 02/18/23    Authorization - Visit Number 7    Authorization - Number of Visits 8    PT Start Time 1110    PT Stop Time 1155    PT Time Calculation (min) 45 min    Equipment Utilized During Treatment --    Activity Tolerance Patient tolerated treatment well    Behavior During Therapy WFL for tasks assessed/performed                        Past Medical History:  Diagnosis Date   A-fib (HCC)    Arthritis    "comes and goes; mostly in hands, occasionally elbow" (04/05/2017)   Complication of anesthesia    "just wanted to sleep alot  for couple days S/P VARICOCELE OR" (04/05/2017)   DVT (deep venous thrombosis) (HCC)    LLE   Dysrhythmia    PVCs    GERD (gastroesophageal reflux disease)    History of hiatal hernia    History of radiation therapy    Thoracic Spine- 07/13/22-07/23/22-Dr. Antony Blackbird   Hypertension    Impaired glucose tolerance    Multiple myeloma (HCC)    Obesity (BMI 30-39.9) 07/26/2016   OSA on CPAP 07/26/2016   Mild with AHI 12.5/hr now on CPAP at 14cm H2O   PAF (paroxysmal atrial fibrillation) (HCC)    s/p PVI Ablation in 2018 // Flecainide Rx // Echo 8/21: EF 60-65, no RWMA, mild LVH, normal RV SF, moderate RVE, mild BAE, trivial MR, mild dilation of aortic root (40 mm)   Pneumonia    "may have had walking pneumonia a few years ago" (04/05/2017)   Pre-diabetes    Thrombophlebitis    Past Surgical History:  Procedure Laterality Date   ATRIAL FIBRILLATION ABLATION  04/05/2017   ATRIAL FIBRILLATION ABLATION N/A 04/05/2017   Procedure: ATRIAL FIBRILLATION ABLATION;  Surgeon: [name], MD;  Location: [facility];  Service: Cardiovascular;  Laterality: N/A;    BONE BIOPSY Right 12/17/2021   Procedure: BONE BIOPSY;  Surgeon: [name], MD;  Location: [facility];  Service: Orthopedics;  Laterality: Right;   COMPLETE RIGHT HIP REPLACMENT  01/19/2019   EVALUATION UNDER ANESTHESIA WITH HEMORRHOIDECTOMY N/A 02/24/2017   Procedure: EXAM UNDER ANESTHESIA WITH  HEMORRHOIDECTOMY;  Surgeon: [name], MD;  Location: [facility];  Service: General;  Laterality: N/A;   EXCISIONAL TOTAL HIP ARTHROPLASTY WITH ANTIBIOTIC SPACERS Right 09/25/2020   Procedure: EXCISIONAL TOTAL HIP ARTHROPLASTY WITH ANTIBIOTIC SPACERS;  Surgeon: [name], MD;  Location: [facility];  Service: Orthopedics;  Laterality: Right;   FLEXIBLE SIGMOIDOSCOPY N/A 04/15/2015   Procedure:  UNSEDATED FLEXIBLE SIGMOIDOSCOPY;  Surgeon: [name], MD;  Location: [facility];  Service: Endoscopy;  Laterality: N/A;   HUMERUS IM NAIL Right 12/17/2021   Procedure: INTRAMEDULLARY (IM) NAIL HUMERAL;  Surgeon: [name], MD;  Location: [facility];  Service: Orthopedics;  Laterality: Right;   KNEE ARTHROSCOPY Left ~ 2016   POSTERIOR CERVICAL FUSION/FORAMINOTOMY N/A 06/04/2022   Procedure: Thoracic one - Thoracic Four Posterior lateral arthrodesis/ fusion and Thoracic two Corpectomy;  Surgeon: [name], MD;  Location: [facility];  Service: Neurosurgery;  Laterality: N/A;   REIMPLANTATION OF TOTAL HIP Right 12/25/2020   Procedure: REIMPLANTATION/REVISION OF RIGHT TOTAL HIP VERSUS REPEAT IRRIGATION AND DEBRIDEMENT;  Surgeon: [name], MD;  Location: [facility];  Service: Orthopedics;  Laterality: Right;   TESTICLE SURGERY  1988   VARICOCELE   TONSILLECTOMY     VARICOSE VEIN SURGERY Bilateral    Patient Active Problem List   Diagnosis Date Noted   Pancreatic cyst 09/30/2022   Infection due to novel influenza A virus 09/29/2022   Persistent atrial fibrillation (HCC) 07/15/2022   Acute on chronic diastolic heart failure (HCC) 07/03/2022   Pulmonary infiltrates  07/01/2022   Acute respiratory failure with hypoxia (HCC) 07/01/2022   Hyponatremia 06/29/2022   History of thoracic surgery 06/29/2022   Acute DVT of left tibial vein (HCC) 06/21/2022   Hypokalemia 06/21/2022   Vitamin D deficiency 05/30/2022   Other constipation 05/29/2022   Pathologic compression fracture of spine, initial encounter (HCC) 05/29/2022   Cord compression (HCC) 05/28/2022   Degenerative cervical disc 05/14/2022   Hypercoagulable state due to persistent atrial fibrillation (HCC) 01/11/2022   Multiple myeloma (HCC) 12/10/2021   Medication monitoring encounter 02/27/2021   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   Status post peripherally inserted central catheter (PICC) central line placement 11/06/2020   Drug rash with eosinophilia and systemic symptoms syndrome 11/06/2020   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   PAF (paroxysmal atrial fibrillation) (HCC)    GERD (gastroesophageal reflux disease) 03/29/2018   Hiatal hernia 03/29/2018   History of DVT (deep vein thrombosis) 03/29/2018   CAP (community acquired pneumonia) 03/29/2018   Osteoarthritis 03/29/2018   Neuropathy 08/29/2017   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   Paroxysmal atrial fibrillation with rapid ventricular response (HCC) 04/05/2017   Prolapsed internal hemorrhoids, grade 4, s/p ligation/pexy/hemorrhoidectomy x 2 02/24/2017 02/24/2017   OSA (obstructive sleep apnea) 07/26/2016   Obesity (BMI 30-39.9) 07/26/2016   Atrial fibrillation with RVR (HCC)    Essential hypertension    Chest pain at rest     PCP: Eleanora Neighbor, MD  REFERRING PROVIDER: Antony Blackbird, MD  REFERRING DIAG: Multiple Myeloma  THERAPY DIAG:  Compression fracture of thoracic vertebra, unspecified thoracic vertebral level, initial encounter (HCC)  Multiple myeloma not having achieved remission (HCC)  Muscle weakness (generalized)  Abnormal posture  Neck pain  Other lack of  coordination  Chronic right shoulder pain  ONSET DATE: 2023  Rationale for Evaluation and Treatment: Rehabilitation  SUBJECTIVE:  SUBJECTIVE STATEMENT:  I hurt my right hip in the pool. That's my THA hip.  My leg is sore at the hip and down the side of my leg about halfway. Its OK walking straight forwad, but when I turn its an 8/10. I have the pool on Friday and I will make that my last day. I have a cataract surgery on Monday. Discussed going to [facility] if he has continued concerns and to put his mind at ease since his MD can't see him for 4 weeks.  PERTINENT HISTORY:  DARRICK SALVA 72 y.o. male with medical history significant for IgA Kappa Multiple Myeloma. He was diagnosed with smouldering myeloma in 2018 . Last July he developed right arm pain due to a pathologic fx and he underwent radiation and fixation. In early Feb 2024 he had radiation for pathologic T2 compression fx followed by a T1-T4 fusion. PAIN:  Are you having pain?  PAIN:  Are you having pain? YES NPRS scale:  8/10 Pain location: Right hip/ITB Pain orientation: right PAIN TYPE: aching Pain description: constant  Aggravating factors: turning Relieving factors:   PRECAUTIONS: Multiple Myeloma with mets to R ribs, R humerus with ORIF for pathological fracture 12/17/21. Lesions have also been found in R rib and also in his skull. Pathologic fx T2 with fusion T1-4 on 06/04/22, Bilateral LE edema (on Lasix), Afib   WEIGHT BEARING RESTRICTIONS: No  FALLS:  Has patient fallen in last 6 months? Yes. Number of falls 1  LIVING ENVIRONMENT: Lives with: lives with their family and lives with their spouse Lives in: House/apartment Stairs: Yes; Internal: 14 steps; on left going up and External: 3 steps; on right going up Has  following equipment at home: Single point cane and Walker - 4 wheeled  OCCUPATION: attorney/ mostly retired?  LEISURE: golf (can't do now.), use to play racquetball  HAND DOMINANCE: right   PRIOR LEVEL OF FUNCTION: Independent  POSTURE:significant slump sitting posture with forward head  PATIENT GOALS: decrease pain,get stronger,improve balance   OBJECTIVE:  COGNITION: Overall cognitive status: Within functional limits for tasks assessed   PALPATION: Very tender bilateral UT/levator, scapular regions  OBSERVATIONS / OTHER ASSESSMENTS: very flexed head and neck with chin nearly at chest  SENSATION: Light touch: Appears intact    POSTURE: significant forward head, round shoulders, increased kyphosis   Gait holding single point cane in left hand but not using. Right shoulder low, flexed neck;difficulty raising head to see where he is going  UPPER EXTREMITY AROM/PROM:  A/PROM RIGHT   eval  RIGHT 11/08/2022 RIGHT 12/20/2022 RIGHT 02/02/2023  Shoulder extension      Shoulder flexion 91 with compensation 101 with compensation  101 with compensation 104 with compensation  Shoulder abduction 67 with compensation 75 with compensation 75 compensation 80 with compensation  Shoulder internal rotation      Shoulder external rotation C6       (Blank rows = not tested)  A/PROM LEFT   eval  Shoulder extension   Shoulder flexion 123  Shoulder abduction 112  Shoulder internal rotation T7  Shoulder external rotation t3    (Blank rows = not tested)  CERVICAL AROM: All within normal limits:    Percent limited 11/08/22 12/20/2022  Flexion Fully flexed    Extension From flexed position decreased 80% Decreased 70% Decreased 40%  Right lateral flexion     Left lateral flexion     Right rotation Dec 50% sitting in FH posture  Dec 50% from  forward head posture  Left rotation        Dec 50%  Decreased 50% from forward head posture    UPPER EXTREMITY STRENGTH:    LOWER EXTREMITY  AROM/PROM:  A/PROM Right eval  Hip flexion   Hip extension   Hip abduction   Hip adduction   Hip internal rotation   Hip external rotation   Knee flexion   Knee extension   Ankle dorsiflexion   Ankle plantarflexion   Ankle inversion   Ankle eversion    (Blank rows = not tested)  A/PROM LEFT eval  Hip flexion   Hip extension   Hip abduction   Hip adduction   Hip internal rotation   Hip external rotation   Knee flexion   Knee extension   Ankle dorsiflexion   Ankle plantarflexion   Ankle inversion   Ankle eversion    (Blank rows = not tested)  LOWER EXTREMITY MMT:    10/04/2022  4 position balance test; able to hold DLS feet together , and half tandem stance 10 seconds. Unable to maintain full tandem stance for 10 sec, unable to hold SLS greater than a second or 2 on left, and 4 seconds on right.     LYMPHEDEMA ASSESSMENTS:   SURGERY TYPE/DATE: 8/17/23Left prox humerus fixation for pathologic fx  2/2/2024Fusion T1-T4 for pathologic fx with cord compression 06/04/22 posterior cervical fusion/foraminotomy;T1-4 fusion NUMBER OF LYMPH NODES REMOVED: NA  CHEMOTHERAPY: Velcade/promalyst/Dex;  RADIATION:to prox humerus and thoracic spine pathologic fx  HORMONE TREATMENT: NA  INFECTIONS: NA  FUNCTIONAL TESTS:  10/09/2022;30 second sit to stand 10 with heavy use of arms  GAIT: Distance walked: 20 Assistive device utilized: carried SPC in left Level of assistance: Complete Independence Comments: feels unsteady, very flexed neck posture   QUICK DASH SURVEY:    TODAY'S TREATMENT:                                                                                                                                         DATE:  02/02/2023 Rolling to right ITB and Right piriformis over clothing Friction massage at posterior greater trochanter over clothing Gentle Bilateral ITB stretch and piriformis stretch by PT x 3 ea Tested SLS on left; unable  to maintain secondary to  fallen arch per pt., and unable to maintain on right but no pain.  Advised pt he can go to [facility] if he ever feels he needs to be seen and his MD can't get him in for 4 weeks Reviewed goals Pt to attend pool therapy on Friday and will then be discharged.  01/28/23:Pt seen for aquatic therapy today.  Treatment took place in water 3.5-4.75 ft in depth at the [facility] pool. Temp of water was 91.  Pt entered/exited the pool via stairs with bilat rail independently. Pt requires buoyancy of water for support and to offload joints with  strengthening exercises.Pt utilizes viscosity of the water required for strengthening.   Seated water bench with 75% submersion Pt performed seated LE AROM exercises 20x in all planes, with concurrent discussion of his current health status and pain assessment. 75% depth water walking with single buoy UE wts for push/pull 10 lengths added ankle fins for added resisatnce. Hip 3 way kicks 20x Bil holding onto wall for balance with VC to push water a bit faster ankle fins for more resistance. Small marching across pool holding single buoy floats under water lengths f/f tandem walking holding floats 4x across pool.  Single leg press with small noodle 2x20 Bil with balance assist. Horizontal float with concurrent skin rolling on upper back and lateral sway for trunk stretching by PTA.   01/19/23:Pt seen for aquatic therapy today.  Treatment took place in water 3.5-4.75 ft in depth at the [facility] pool. Temp of water was 91.  Pt entered/exited the pool via stairs with bilat rail independently. Pt requires buoyancy of water for support and to offload joints with strengthening exercises.Pt utilizes viscosity of the water required for strengthening.   Seated water bench with 75% submersion Pt performed seated LE AROM exercises 20x in all planes, with concurrent discussion of his current health status and pain assessment. 75% depth water walking  with single buoy UE wts for push/pull 10 lengths added ankle fins for added resisatnce. Hip 3 way kicks 15x Bil holding onto wall for balance with VC to push water a bit faster ankle fins for more resistance. Small marching across pool holding single buoy floats under water lengths f/f tandem walking holding floats 4x across pool.  Single leg press with small noodle 2x20 Bil with balance assist. Horizontal float with concurrent skin rolling on upper back and lateral sway for trunk stretching by PTA.  01/17/2023 Nu step seat 12, UE 8, Level 5 x 10 min, 862 steps, O2 sats 95, HR 63 Tandem stance x 2 B to failure,   Vector reaches x 10 B,    SLS 4 times ea side to failure Mini squats 2 x 10 Walking on toes 4 lengths intermittent hand hold Marching with slow walk to end of bars 4 lengths intermittent HH, rest Shoulder ladder x 5 for flexion Lateral band walks with yellow band 4 laps in bars, hip flexion with knee ext 2 x 10, hip ext 2 x 10 all with yellow with HH Sit to stand x 10 with intermittent use of  hands;seated rest Ambulated 707 ft with SBA and single point cane. Good pace, no LOB. Seated rest.  PATIENT EDUCATION:  Education details: sitting posture with lumbar roll Person educated: Patient Education method: Medical illustrator Education comprehension: verbalized understanding and returned demonstration  HOME EXERCISE PROGRAM: Doing HEP from HHPT; cervical ROM, shoulder rolls, shrugs, scap retraction, UT stretches  Aquatic Access Code: [email or web address] URL: [email or web address] Date: 11/09/2022 Prepared by: [facility] This aquatic home exercise program from [employer] utilizes pictures from land based exercises, but has been adapted prior to lamination and issuance.    ASSESSMENT:  CLINICAL IMPRESSION: Pt is doing much better getting his head into a more erect position. He still complaints of upper back pain, which has improved but  has not met his goals for pain. He continues with right shoulder limitations, and with balance deficits. He feels ready to try things on his own. He will attend his last aquatic class on Friday and then he is considering joining [facility]  to use the pool. OBJECTIVE IMPAIRMENTS: decreased activity tolerance, decreased balance, difficulty walking, decreased ROM, decreased strength, increased edema, impaired UE functional use, postural dysfunction, and pain.   ACTIVITY LIMITATIONS: carrying, lifting, dressing, reach over head, and locomotion level  PARTICIPATION LIMITATIONS: cleaning, laundry, driving, and occupation  PERSONAL FACTORS:  Multiple Myeloma  are also affecting patient's functional outcome.   REHAB POTENTIAL: Good  CLINICAL DECISION MAKING: Evolving/moderate complexity  EVALUATION COMPLEXITY: Moderate  GOALS: Goals reviewed with patient? Yes  SHORT TERM GOALS: Target date: 10/21/22  Pt will be educated in and independent in proper sitting posture using lumbar roll prn Baseline: Goal status; MET 11/08/22 2.  Pt will report decreased muscle soreness in bilateral upper back x 25% Baseline:  Goal status: NOT MET, but improved 3.  Pt will be compliant with a HEP for postural strengthening/ROM Baseline:  Goal status: MET / LONG TERM GOALS: Target date: 12/20/22  Pt will be able to bring his head to neutral position for safety with ambulation/driving Baseline:  Goal status: In progress 2.  Pt will have decreased upper back pain by 50% or more Baseline:  Goal status:NOT MET 3.  Pt will be able to flex right shoulder to 115 degrees with minimal compensation for improved reaching ability Baseline:  Goal status: NOT MET 4; Pt will be able to maintain tandem stance on both sides x 10 sec to demonstrated improved balance. Goal status:MET right foot forward 11/08/2022, MET both legs 12/20/2022  5. Pt will be able to perform sit to stand x 5 without use of Ue's  Baseline:must use  hands   Goal status:MET 12/20/2022 but difficult 6.  Pt will be able to maintain SLS on ea leg x 10 seconds without LOB  GOAL: NOT MET  PT FREQUENCY: 2x/week  PT DURATION: 6 weeks  PLANNED INTERVENTIONS: Therapeutic exercises, Therapeutic activity, Neuromuscular re-education, Balance training, Gait training, Patient/Family education, Self Care, Joint mobilization, Aquatic Therapy, Manual therapy, and Re-evaluation  PLAN FOR NEXT SESSION: Pt to attend Last PT appt and then be discharged  Alvira Monday, PT 02/02/23 1:16 PM

## 2023-02-03 ENCOUNTER — Ambulatory Visit: Payer: Medicare Other | Attending: Physician Assistant

## 2023-02-03 DIAGNOSIS — I110 Hypertensive heart disease with heart failure: Secondary | ICD-10-CM | POA: Diagnosis not present

## 2023-02-03 DIAGNOSIS — I10 Essential (primary) hypertension: Secondary | ICD-10-CM | POA: Diagnosis not present

## 2023-02-03 DIAGNOSIS — C9000 Multiple myeloma not having achieved remission: Secondary | ICD-10-CM | POA: Diagnosis not present

## 2023-02-03 DIAGNOSIS — Z86718 Personal history of other venous thrombosis and embolism: Secondary | ICD-10-CM | POA: Diagnosis not present

## 2023-02-03 DIAGNOSIS — R7303 Prediabetes: Secondary | ICD-10-CM | POA: Diagnosis not present

## 2023-02-03 DIAGNOSIS — I480 Paroxysmal atrial fibrillation: Secondary | ICD-10-CM | POA: Diagnosis not present

## 2023-02-03 DIAGNOSIS — I5033 Acute on chronic diastolic (congestive) heart failure: Secondary | ICD-10-CM

## 2023-02-03 DIAGNOSIS — E7800 Pure hypercholesterolemia, unspecified: Secondary | ICD-10-CM | POA: Diagnosis not present

## 2023-02-03 DIAGNOSIS — I5030 Unspecified diastolic (congestive) heart failure: Secondary | ICD-10-CM

## 2023-02-03 DIAGNOSIS — I5032 Chronic diastolic (congestive) heart failure: Secondary | ICD-10-CM | POA: Diagnosis not present

## 2023-02-03 DIAGNOSIS — Z79899 Other long term (current) drug therapy: Secondary | ICD-10-CM | POA: Diagnosis not present

## 2023-02-03 DIAGNOSIS — I4891 Unspecified atrial fibrillation: Secondary | ICD-10-CM | POA: Diagnosis not present

## 2023-02-03 DIAGNOSIS — Z0000 Encounter for general adult medical examination without abnormal findings: Secondary | ICD-10-CM | POA: Diagnosis not present

## 2023-02-03 DIAGNOSIS — R001 Bradycardia, unspecified: Secondary | ICD-10-CM

## 2023-02-03 DIAGNOSIS — K760 Fatty (change of) liver, not elsewhere classified: Secondary | ICD-10-CM | POA: Diagnosis not present

## 2023-02-03 DIAGNOSIS — D6869 Other thrombophilia: Secondary | ICD-10-CM | POA: Diagnosis not present

## 2023-02-03 DIAGNOSIS — D369 Benign neoplasm, unspecified site: Secondary | ICD-10-CM | POA: Diagnosis not present

## 2023-02-04 ENCOUNTER — Encounter: Payer: Self-pay | Admitting: Hematology and Oncology

## 2023-02-04 ENCOUNTER — Encounter: Payer: Self-pay | Admitting: Physical Therapy

## 2023-02-04 ENCOUNTER — Ambulatory Visit: Payer: Medicare Other | Admitting: Physical Therapy

## 2023-02-04 DIAGNOSIS — G8929 Other chronic pain: Secondary | ICD-10-CM

## 2023-02-04 DIAGNOSIS — S22000A Wedge compression fracture of unspecified thoracic vertebra, initial encounter for closed fracture: Secondary | ICD-10-CM | POA: Diagnosis not present

## 2023-02-04 DIAGNOSIS — M25511 Pain in right shoulder: Secondary | ICD-10-CM | POA: Diagnosis not present

## 2023-02-04 DIAGNOSIS — C9000 Multiple myeloma not having achieved remission: Secondary | ICD-10-CM | POA: Diagnosis not present

## 2023-02-04 DIAGNOSIS — R293 Abnormal posture: Secondary | ICD-10-CM | POA: Diagnosis not present

## 2023-02-04 DIAGNOSIS — M542 Cervicalgia: Secondary | ICD-10-CM | POA: Diagnosis not present

## 2023-02-04 DIAGNOSIS — R278 Other lack of coordination: Secondary | ICD-10-CM | POA: Diagnosis not present

## 2023-02-04 DIAGNOSIS — M6281 Muscle weakness (generalized): Secondary | ICD-10-CM

## 2023-02-04 LAB — BASIC METABOLIC PANEL
BUN/Creatinine Ratio: 27 — ABNORMAL HIGH (ref 10–24)
BUN: 26 mg/dL (ref 8–27)
CO2: 24 mmol/L (ref 20–29)
Calcium: 8.5 mg/dL — ABNORMAL LOW (ref 8.6–10.2)
Chloride: 106 mmol/L (ref 96–106)
Creatinine, Ser: 0.98 mg/dL (ref 0.76–1.27)
Glucose: 69 mg/dL — ABNORMAL LOW (ref 70–99)
Potassium: 4.1 mmol/L (ref 3.5–5.2)
Sodium: 146 mmol/L — ABNORMAL HIGH (ref 134–144)
eGFR: 82 mL/min/{1.73_m2} (ref >59)

## 2023-02-04 LAB — PRO B NATRIURETIC PEPTIDE: NT-Pro BNP: 181 pg/mL (ref 0–376)

## 2023-02-04 NOTE — Therapy (Signed)
OUTPATIENT PHYSICAL THERAPY ONCOLOGY TREATMENT  Patient Name: Travis [name] MRN: [id number] DOB:[birth year], 72 y.o., male Today's Date: 02/04/2023   END OF SESSION:  PT End of Session - 02/04/23 1626     Visit Number 32    Number of Visits 37    Date for PT Re-Evaluation 02/18/23    Authorization - Visit Number 8    Authorization - Number of Visits 8    PT Start Time 1345    PT Stop Time 1430    PT Time Calculation (min) 45 min    Activity Tolerance Patient tolerated treatment well    Behavior During Therapy WFL for tasks assessed/performed                         Past Medical History:  Diagnosis Date   A-fib (HCC)    Arthritis    "comes and goes; mostly in hands, occasionally elbow" (04/05/2017)   Complication of anesthesia    "just wanted to sleep alot  for couple days S/P VARICOCELE OR" (04/05/2017)   DVT (deep venous thrombosis) (HCC)    LLE   Dysrhythmia    PVCs    GERD (gastroesophageal reflux disease)    History of hiatal hernia    History of radiation therapy    Thoracic Spine- 07/13/22-07/23/22-Dr. Antony Blackbird   Hypertension    Impaired glucose tolerance    Multiple myeloma (HCC)    Obesity (BMI 30-39.9) 07/26/2016   OSA on CPAP 07/26/2016   Mild with AHI 12.5/hr now on CPAP at 14cm H2O   PAF (paroxysmal atrial fibrillation) (HCC)    s/p PVI Ablation in 2018 // Flecainide Rx // Echo 8/21: EF 60-65, no RWMA, mild LVH, normal RV SF, moderate RVE, mild BAE, trivial MR, mild dilation of aortic root (40 mm)   Pneumonia    "may have had walking pneumonia a few years ago" (04/05/2017)   Pre-diabetes    Thrombophlebitis    Past Surgical History:  Procedure Laterality Date   ATRIAL FIBRILLATION ABLATION  04/05/2017   ATRIAL FIBRILLATION ABLATION N/A 04/05/2017   Procedure: ATRIAL FIBRILLATION ABLATION;  Surgeon: [name], MD;  Location: [facility];  Service: Cardiovascular;  Laterality: N/A;   BONE BIOPSY Right 12/17/2021    Procedure: BONE BIOPSY;  Surgeon: [name], MD;  Location: [facility];  Service: Orthopedics;  Laterality: Right;   COMPLETE RIGHT HIP REPLACMENT  01/19/2019   EVALUATION UNDER ANESTHESIA WITH HEMORRHOIDECTOMY N/A 02/24/2017   Procedure: EXAM UNDER ANESTHESIA WITH  HEMORRHOIDECTOMY;  Surgeon: [name], MD;  Location: [facility];  Service: General;  Laterality: N/A;   EXCISIONAL TOTAL HIP ARTHROPLASTY WITH ANTIBIOTIC SPACERS Right 09/25/2020   Procedure: EXCISIONAL TOTAL HIP ARTHROPLASTY WITH ANTIBIOTIC SPACERS;  Surgeon: [name], MD;  Location: [facility];  Service: Orthopedics;  Laterality: Right;   FLEXIBLE SIGMOIDOSCOPY N/A 04/15/2015   Procedure:  UNSEDATED FLEXIBLE SIGMOIDOSCOPY;  Surgeon: [name], MD;  Location: [facility];  Service: Endoscopy;  Laterality: N/A;   HUMERUS IM NAIL Right 12/17/2021   Procedure: INTRAMEDULLARY (IM) NAIL HUMERAL;  Surgeon: [name], MD;  Location:  SURGERY CENTER;  Service: Orthopedics;  Laterality: Right;   KNEE ARTHROSCOPY Left ~ 2016   POSTERIOR CERVICAL FUSION/FORAMINOTOMY N/A 06/04/2022   Procedure: Thoracic one - Thoracic Four Posterior lateral arthrodesis/ fusion and Thoracic two Corpectomy;  Surgeon: [name], MD;  Location: [facility];  Service: Neurosurgery;  Laterality: N/A;  REIMPLANTATION OF TOTAL HIP Right 12/25/2020   Procedure: REIMPLANTATION/REVISION OF RIGHT TOTAL HIP VERSUS REPEAT IRRIGATION AND DEBRIDEMENT;  Surgeon: [name], MD;  Location: [facility];  Service: Orthopedics;  Laterality: Right;   TESTICLE SURGERY  1988   VARICOCELE   TONSILLECTOMY     VARICOSE VEIN SURGERY Bilateral    Patient Active Problem List   Diagnosis Date Noted   Pancreatic cyst 09/30/2022   Infection due to novel influenza A virus 09/29/2022   Persistent atrial fibrillation (HCC) 07/15/2022   Acute on chronic diastolic heart failure (HCC) 07/03/2022   Pulmonary infiltrates 07/01/2022   Acute respiratory failure  with hypoxia (HCC) 07/01/2022   Hyponatremia 06/29/2022   History of thoracic surgery 06/29/2022   Acute DVT of left tibial vein (HCC) 06/21/2022   Hypokalemia 06/21/2022   Vitamin D deficiency 05/30/2022   Other constipation 05/29/2022   Pathologic compression fracture of spine, initial encounter (HCC) 05/29/2022   Cord compression (HCC) 05/28/2022   Degenerative cervical disc 05/14/2022   Hypercoagulable state due to persistent atrial fibrillation (HCC) 01/11/2022   Multiple myeloma (HCC) 12/10/2021   Medication monitoring encounter 02/27/2021   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   Status post peripherally inserted central catheter (PICC) central line placement 11/06/2020   Drug rash with eosinophilia and systemic symptoms syndrome 11/06/2020   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   PAF (paroxysmal atrial fibrillation) (HCC)    GERD (gastroesophageal reflux disease) 03/29/2018   Hiatal hernia 03/29/2018   History of DVT (deep vein thrombosis) 03/29/2018   CAP (community acquired pneumonia) 03/29/2018   Osteoarthritis 03/29/2018   Neuropathy 08/29/2017   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   Paroxysmal atrial fibrillation with rapid ventricular response (HCC) 04/05/2017   Prolapsed internal hemorrhoids, grade 4, s/p ligation/pexy/hemorrhoidectomy x 2 02/24/2017 02/24/2017   OSA (obstructive sleep apnea) 07/26/2016   Obesity (BMI 30-39.9) 07/26/2016   Atrial fibrillation with RVR (HCC)    Essential hypertension    Chest pain at rest     PCP: Eleanora Neighbor, MD  REFERRING PROVIDER: Antony Blackbird, MD  REFERRING DIAG: Multiple Myeloma  THERAPY DIAG:  Compression fracture of thoracic vertebra, unspecified thoracic vertebral level, initial encounter (HCC)  Multiple myeloma not having achieved remission (HCC)  Muscle weakness (generalized)  Abnormal posture  Neck pain  Other lack of coordination  Chronic right shoulder pain  ONSET  DATE: 2023  Rationale for Evaluation and Treatment: Rehabilitation  SUBJECTIVE:  SUBJECTIVE STATEMENT: RT hip much better now. Just being careful. I feel everything is about 30%-40% improved since my first day. I plan to join the pool when I get back from my trip.  PERTINENT HISTORY:  Travis [name] 72 y.o. male with medical history significant for IgA Kappa Multiple Myeloma. He was diagnosed with smouldering myeloma in 2018 . Last July he developed right arm pain due to a pathologic fx and he underwent radiation and fixation. In early Feb 2024 he had radiation for pathologic T2 compression fx followed by a T1-T4 fusion. PAIN:  Are you having pain?  PAIN:  Are you having pain? YES NPRS scale:  8/10 Pain location: Right hip/ITB Pain orientation: right PAIN TYPE: aching Pain description: constant  Aggravating factors: turning Relieving factors:   PRECAUTIONS: Multiple Myeloma with mets to R ribs, R humerus with ORIF for pathological fracture 12/17/21. Lesions have also been found in R rib and also in his skull. Pathologic fx T2 with fusion T1-4 on 06/04/22, Bilateral LE edema (on Lasix), Afib   WEIGHT BEARING RESTRICTIONS: No  FALLS:  Has patient fallen in last 6 months? Yes. Number of falls 1  LIVING ENVIRONMENT: Lives with: lives with their family and lives with their spouse Lives in: House/apartment Stairs: Yes; Internal: 14 steps; on left going up and External: 3 steps; on right going up Has following equipment at home: Single point cane and Walker - 4 wheeled  OCCUPATION: attorney/ mostly retired?  LEISURE: golf (can't do now.), use to play racquetball  HAND DOMINANCE: right   PRIOR LEVEL OF FUNCTION: Independent  POSTURE:significant slump sitting posture with forward head  PATIENT GOALS:  decrease pain,get stronger,improve balance   OBJECTIVE:  COGNITION: Overall cognitive status: Within functional limits for tasks assessed   PALPATION: Very tender bilateral UT/levator, scapular regions  OBSERVATIONS / OTHER ASSESSMENTS: very flexed head and neck with chin nearly at chest  SENSATION: Light touch: Appears intact    POSTURE: significant forward head, round shoulders, increased kyphosis   Gait holding single point cane in left hand but not using. Right shoulder low, flexed neck;difficulty raising head to see where he is going  UPPER EXTREMITY AROM/PROM:  A/PROM RIGHT   eval  RIGHT 11/08/2022 RIGHT 12/20/2022 RIGHT 02/02/2023  Shoulder extension      Shoulder flexion 91 with compensation 101 with compensation  101 with compensation 104 with compensation  Shoulder abduction 67 with compensation 75 with compensation 75 compensation 80 with compensation  Shoulder internal rotation      Shoulder external rotation C6       (Blank rows = not tested)  A/PROM LEFT   eval  Shoulder extension   Shoulder flexion 123  Shoulder abduction 112  Shoulder internal rotation T7  Shoulder external rotation t3    (Blank rows = not tested)  CERVICAL AROM: All within normal limits:    Percent limited 11/08/22 12/20/2022  Flexion Fully flexed    Extension From flexed position decreased 80% Decreased 70% Decreased 40%  Right lateral flexion     Left lateral flexion     Right rotation Dec 50% sitting in FH posture  Dec 50% from forward head posture  Left rotation        Dec 50%  Decreased 50% from forward head posture    UPPER EXTREMITY STRENGTH:    LOWER EXTREMITY AROM/PROM:  A/PROM Right eval  Hip flexion   Hip extension   Hip abduction   Hip adduction   Hip internal rotation  Hip external rotation   Knee flexion   Knee extension   Ankle dorsiflexion   Ankle plantarflexion   Ankle inversion   Ankle eversion    (Blank rows = not tested)  A/PROM LEFT eval   Hip flexion   Hip extension   Hip abduction   Hip adduction   Hip internal rotation   Hip external rotation   Knee flexion   Knee extension   Ankle dorsiflexion   Ankle plantarflexion   Ankle inversion   Ankle eversion    (Blank rows = not tested)  LOWER EXTREMITY MMT:    10/04/2022  4 position balance test; able to hold DLS feet together , and half tandem stance 10 seconds. Unable to maintain full tandem stance for 10 sec, unable to hold SLS greater than a second or 2 on left, and 4 seconds on right.     LYMPHEDEMA ASSESSMENTS:   SURGERY TYPE/DATE: 8/17/23Left prox humerus fixation for pathologic fx  2/2/2024Fusion T1-T4 for pathologic fx with cord compression 06/04/22 posterior cervical fusion/foraminotomy;T1-4 fusion NUMBER OF LYMPH NODES REMOVED: NA  CHEMOTHERAPY: Velcade/promalyst/Dex;  RADIATION:to prox humerus and thoracic spine pathologic fx  HORMONE TREATMENT: NA  INFECTIONS: NA  FUNCTIONAL TESTS:  10/09/2022;30 second sit to stand 10 with heavy use of arms  GAIT: Distance walked: 20 Assistive device utilized: carried SPC in left Level of assistance: Complete Independence Comments: feels unsteady, very flexed neck posture   QUICK DASH SURVEY:    TODAY'S TREATMENT:                                                                                                                                         DATE:   02/04/23:Pt seen for aquatic therapy today.  Treatment took place in water 3.5-4.75 ft in depth at the [facility] pool. Temp of water was 91.  Pt entered/exited the pool via stairs with bilat rail independently. Pt requires buoyancy of water for support and to offload joints with strengthening exercises.Pt utilizes viscosity of the water required for strengthening.   Seated water bench with 75% submersion Pt performed seated LE AROM exercises 20x in all planes, with concurrent discussion of his current health status and pain assessment. 75% depth  water walking with single buoy UE wts for push/pull 10 lengths added ankle fins for added resisatnce. Small marching across pool holding single buoy floats under water lengths f/f tandem walking holding floats 4x across pool.  Single leg press with small noodle 2x20 Bil with balance assist. Horizontal float with concurrent skin rolling on upper back and lateral sway for trunk stretching by PTA.  02/02/2023 Rolling to right ITB and Right piriformis over clothing Friction massage at posterior greater trochanter over clothing Gentle Bilateral ITB stretch and piriformis stretch by PT x 3 ea Tested SLS on left; unable  to maintain secondary to fallen arch per pt., and unable to maintain on right  but no pain.  Advised pt he can go to [facility] urgent care if he ever feels he needs to be seen and his MD can't get him in for 4 weeks Reviewed goals Pt to attend pool therapy on Friday and will then be discharged.  01/28/23:Pt seen for aquatic therapy today.  Treatment took place in water 3.5-4.75 ft in depth at the [facility] pool. Temp of water was 91.  Pt entered/exited the pool via stairs with bilat rail independently. Pt requires buoyancy of water for support and to offload joints with strengthening exercises.Pt utilizes viscosity of the water required for strengthening.   Seated water bench with 75% submersion Pt performed seated LE AROM exercises 20x in all planes, with concurrent discussion of his current health status and pain assessment. 75% depth water walking with single buoy UE wts for push/pull 10 lengths added ankle fins for added resisatnce. Hip 3 way kicks 20x Bil holding onto wall for balance with VC to push water a bit faster ankle fins for more resistance. Small marching across pool holding single buoy floats under water lengths f/f tandem walking holding floats 4x across pool.  Single leg press with small noodle 2x20 Bil with balance assist. Horizontal float with concurrent skin  rolling on upper back and lateral sway for trunk stretching by PTA.   PATIENT EDUCATION:  Education details: sitting posture with lumbar roll Person educated: Patient Education method: Medical illustrator Education comprehension: verbalized understanding and returned demonstration  HOME EXERCISE PROGRAM: Doing HEP from HHPT; cervical ROM, shoulder rolls, shrugs, scap retraction, UT stretches  Aquatic Access Code: [email or web address] URL: [email or web address] Date: 11/09/2022 Prepared by: [facility] [address] This aquatic home exercise program from MedBridge utilizes pictures from land based exercises, but has been adapted prior to lamination and issuance.    ASSESSMENT:  CLINICAL IMPRESSION: See discharge summary: pt had no pain including RT hip with any of his aquatic exercises. He plans to join the pool at [place] upon his return from [place] trip. He feels everything is 30-40% improved since eval.  OBJECTIVE IMPAIRMENTS: decreased activity tolerance, decreased balance, difficulty walking, decreased ROM, decreased strength, increased edema, impaired UE functional use, postural dysfunction, and pain.   ACTIVITY LIMITATIONS: carrying, lifting, dressing, reach over head, and locomotion level  PARTICIPATION LIMITATIONS: cleaning, laundry, driving, and occupation  PERSONAL FACTORS:  Multiple Myeloma  are also affecting patient's functional outcome.   REHAB POTENTIAL: Good  CLINICAL DECISION MAKING: Evolving/moderate complexity  EVALUATION COMPLEXITY: Moderate  GOALS: Goals reviewed with patient? Yes  SHORT TERM GOALS: Target date: 10/21/22  Pt will be educated in and independent in proper sitting posture using lumbar roll prn Baseline: Goal status; MET 11/08/22 2.  Pt will report decreased muscle soreness in bilateral upper back x 25% Baseline:  Goal status: Met 30-40% 02/04/23 3.  Pt will be compliant with a HEP for postural  strengthening/ROM Baseline:  Goal status: MET / LONG TERM GOALS: Target date: 12/20/22  Pt will be able to bring his head to neutral position for safety with ambulation/driving Baseline:  Goal status: Not met consistently 2.  Pt will have decreased upper back pain by 50% or more Baseline:  Goal status:Not met 30% -40% 3.  Pt will be able to flex right shoulder to 115 degrees with minimal compensation for improved reaching ability Baseline:  Goal status: NOT MET 4; Pt will be able to maintain tandem stance on both sides x 10 sec to demonstrated improved balance. Goal  status:MET right foot forward 11/08/2022, MET both legs 12/20/2022  5. Pt will be able to perform sit to stand x 5 without use of Ue's  Baseline:must use hands   Goal status:MET 12/20/2022 but difficult 6.  Pt will be able to maintain SLS on ea leg x 10 seconds without LOB  GOAL: NOT MET  PT FREQUENCY: 2x/week  PT DURATION: 6 weeks  PLANNED INTERVENTIONS: Therapeutic exercises, Therapeutic activity, Neuromuscular re-education, Balance training, Gait training, Patient/Family education, Self Care, Joint mobilization, Aquatic Therapy, Manual therapy, and Re-evaluation  PLAN FOR NEXT SESSION: DC PHYSICAL THERAPY DISCHARGE SUMMARY  Visits from Start of Care: 32  Current functional level related to goals / functional outcomes: Partially achieved goals   Remaining deficits: Greatly improved but still not able to maintain proper head posture with eyes level, still limited with right shoulder ROM, Pain goal improved 30-40%, SLS still very limited   Education / Equipment: HEP   Patient agrees to discharge. Patient goals were partially met. Patient is being discharged due to  feeling ready to continue with aquatics and exercising on his own.Ane Payment, PTA 02/04/23 4:28 PM  Alvira Monday, PT 02/06/23 10:03 AM

## 2023-02-07 DIAGNOSIS — H25811 Combined forms of age-related cataract, right eye: Secondary | ICD-10-CM | POA: Diagnosis not present

## 2023-02-07 DIAGNOSIS — H2511 Age-related nuclear cataract, right eye: Secondary | ICD-10-CM | POA: Diagnosis not present

## 2023-02-08 LAB — LAB REPORT - SCANNED
A1c: 6.2
EGFR: 76

## 2023-02-09 ENCOUNTER — Inpatient Hospital Stay: Payer: Medicare Other | Attending: Hematology and Oncology

## 2023-02-09 ENCOUNTER — Inpatient Hospital Stay: Payer: Medicare Other | Attending: Hematology and Oncology | Admitting: Physician Assistant

## 2023-02-09 ENCOUNTER — Inpatient Hospital Stay: Payer: Medicare Other

## 2023-02-09 ENCOUNTER — Encounter: Payer: Self-pay | Admitting: Internal Medicine

## 2023-02-09 VITALS — BP 137/59 | HR 56 | Temp 98.1°F | Resp 18 | Ht 70.0 in | Wt 260.3 lb

## 2023-02-09 VITALS — BP 142/62 | HR 59

## 2023-02-09 DIAGNOSIS — Z7961 Long term (current) use of immunomodulator: Secondary | ICD-10-CM | POA: Insufficient documentation

## 2023-02-09 DIAGNOSIS — I5033 Acute on chronic diastolic (congestive) heart failure: Secondary | ICD-10-CM | POA: Insufficient documentation

## 2023-02-09 DIAGNOSIS — C9000 Multiple myeloma not having achieved remission: Secondary | ICD-10-CM | POA: Insufficient documentation

## 2023-02-09 DIAGNOSIS — Z5112 Encounter for antineoplastic immunotherapy: Secondary | ICD-10-CM | POA: Insufficient documentation

## 2023-02-09 DIAGNOSIS — M5126 Other intervertebral disc displacement, lumbar region: Secondary | ICD-10-CM | POA: Insufficient documentation

## 2023-02-09 DIAGNOSIS — M48061 Spinal stenosis, lumbar region without neurogenic claudication: Secondary | ICD-10-CM | POA: Diagnosis not present

## 2023-02-09 DIAGNOSIS — Z7901 Long term (current) use of anticoagulants: Secondary | ICD-10-CM | POA: Insufficient documentation

## 2023-02-09 DIAGNOSIS — M533 Sacrococcygeal disorders, not elsewhere classified: Secondary | ICD-10-CM | POA: Insufficient documentation

## 2023-02-09 DIAGNOSIS — G4733 Obstructive sleep apnea (adult) (pediatric): Secondary | ICD-10-CM | POA: Diagnosis not present

## 2023-02-09 DIAGNOSIS — R21 Rash and other nonspecific skin eruption: Secondary | ICD-10-CM | POA: Insufficient documentation

## 2023-02-09 DIAGNOSIS — E876 Hypokalemia: Secondary | ICD-10-CM | POA: Insufficient documentation

## 2023-02-09 DIAGNOSIS — Z79624 Long term (current) use of inhibitors of nucleotide synthesis: Secondary | ICD-10-CM | POA: Insufficient documentation

## 2023-02-09 DIAGNOSIS — M546 Pain in thoracic spine: Secondary | ICD-10-CM | POA: Diagnosis not present

## 2023-02-09 DIAGNOSIS — Z86718 Personal history of other venous thrombosis and embolism: Secondary | ICD-10-CM | POA: Insufficient documentation

## 2023-02-09 DIAGNOSIS — Z9089 Acquired absence of other organs: Secondary | ICD-10-CM | POA: Diagnosis not present

## 2023-02-09 DIAGNOSIS — Z79899 Other long term (current) drug therapy: Secondary | ICD-10-CM | POA: Diagnosis not present

## 2023-02-09 DIAGNOSIS — Z7969 Long term (current) use of other immunomodulators and immunosuppressants: Secondary | ICD-10-CM | POA: Diagnosis not present

## 2023-02-09 DIAGNOSIS — Z8249 Family history of ischemic heart disease and other diseases of the circulatory system: Secondary | ICD-10-CM | POA: Insufficient documentation

## 2023-02-09 DIAGNOSIS — Z823 Family history of stroke: Secondary | ICD-10-CM | POA: Insufficient documentation

## 2023-02-09 DIAGNOSIS — Z881 Allergy status to other antibiotic agents status: Secondary | ICD-10-CM | POA: Insufficient documentation

## 2023-02-09 DIAGNOSIS — Z923 Personal history of irradiation: Secondary | ICD-10-CM | POA: Diagnosis not present

## 2023-02-09 DIAGNOSIS — I480 Paroxysmal atrial fibrillation: Secondary | ICD-10-CM | POA: Diagnosis not present

## 2023-02-09 DIAGNOSIS — I10 Essential (primary) hypertension: Secondary | ICD-10-CM | POA: Diagnosis not present

## 2023-02-09 DIAGNOSIS — Z818 Family history of other mental and behavioral disorders: Secondary | ICD-10-CM | POA: Insufficient documentation

## 2023-02-09 DIAGNOSIS — R1310 Dysphagia, unspecified: Secondary | ICD-10-CM | POA: Insufficient documentation

## 2023-02-09 DIAGNOSIS — Z7952 Long term (current) use of systemic steroids: Secondary | ICD-10-CM | POA: Insufficient documentation

## 2023-02-09 LAB — CBC WITH DIFFERENTIAL (CANCER CENTER ONLY)
Abs Immature Granulocytes: 0.05 10*3/uL (ref 0.00–0.07)
Basophils Absolute: 0 10*3/uL (ref 0.0–0.1)
Basophils Relative: 0 %
Eosinophils Absolute: 0 10*3/uL (ref 0.0–0.5)
Eosinophils Relative: 0 %
HCT: 38.6 % — ABNORMAL LOW (ref 39.0–52.0)
Hemoglobin: 12.7 g/dL — ABNORMAL LOW (ref 13.0–17.0)
Immature Granulocytes: 1 %
Lymphocytes Relative: 5 %
Lymphs Abs: 0.5 10*3/uL — ABNORMAL LOW (ref 0.7–4.0)
MCH: 31.3 pg (ref 26.0–34.0)
MCHC: 32.9 g/dL (ref 30.0–36.0)
MCV: 95.1 fL (ref 80.0–100.0)
Monocytes Absolute: 0.5 10*3/uL (ref 0.1–1.0)
Monocytes Relative: 5 %
Neutro Abs: 9.2 10*3/uL — ABNORMAL HIGH (ref 1.7–7.7)
Neutrophils Relative %: 89 %
Platelet Count: 246 10*3/uL (ref 150–400)
RBC: 4.06 MIL/uL — ABNORMAL LOW (ref 4.22–5.81)
RDW: 17.4 % — ABNORMAL HIGH (ref 11.5–15.5)
WBC Count: 10.3 10*3/uL (ref 4.0–10.5)
nRBC: 0 % (ref 0.0–0.2)

## 2023-02-09 LAB — CMP (CANCER CENTER ONLY)
ALT: 18 U/L (ref 0–44)
AST: 17 U/L (ref 15–41)
Albumin: 4.1 g/dL (ref 3.5–5.0)
Alkaline Phosphatase: 75 U/L (ref 38–126)
Anion gap: 8 (ref 5–15)
BUN: 33 mg/dL — ABNORMAL HIGH (ref 8–23)
CO2: 30 mmol/L (ref 22–32)
Calcium: 9.7 mg/dL (ref 8.9–10.3)
Chloride: 103 mmol/L (ref 98–111)
Creatinine: 1.11 mg/dL (ref 0.61–1.24)
GFR, Estimated: >60 mL/min (ref >60)
Glucose, Bld: 106 mg/dL — ABNORMAL HIGH (ref 70–99)
Potassium: 3.6 mmol/L (ref 3.5–5.1)
Sodium: 141 mmol/L (ref 135–145)
Total Bilirubin: 0.4 mg/dL (ref 0.3–1.2)
Total Protein: 6.4 g/dL — ABNORMAL LOW (ref 6.5–8.1)

## 2023-02-09 MED ORDER — SODIUM CHLORIDE 0.9 % IV SOLN: Freq: Once | INTRAVENOUS | Status: AC

## 2023-02-09 MED ORDER — DEXTROSE 5 % IV SOLN
70.0000 mg/m2 | Freq: Once | INTRAVENOUS | Status: AC
  Administered 2023-02-09: 150 mg via INTRAVENOUS
  Filled 2023-02-09: qty 45

## 2023-02-09 MED ORDER — ZOLEDRONIC ACID 4 MG/100ML IV SOLN
4.0000 mg | Freq: Once | INTRAVENOUS | Status: AC
  Administered 2023-02-09: 4 mg via INTRAVENOUS
  Filled 2023-02-09: qty 100

## 2023-02-09 NOTE — Progress Notes (Signed)
Patient takes dexamethasone at home; no need for IV dexamethasone prior to Kyprolis per Georga Kaufmann, PA-C.

## 2023-02-09 NOTE — Patient Instructions (Signed)
[facility]  Discharge Instructions: Thank you for choosing [facility] to provide your oncology and hematology care.   If you have a lab appointment with the Cancer Center, please go directly to the Cancer Center and check in at the registration area.   Wear comfortable clothing and clothing appropriate for easy access to any Portacath or PICC line.   We strive to give you quality time with your provider. You may need to reschedule your appointment if you arrive late (15 or more minutes).  Arriving late affects you and other patients whose appointments are after yours.  Also, if you miss three or more appointments without notifying the office, you may be dismissed from the clinic at the provider's discretion.      For prescription refill requests, have your pharmacy contact our office and allow 72 hours for refills to be completed.    Today you received the following chemotherapy and/or immunotherapy agents : Kyprolis      To help prevent nausea and vomiting after your treatment, we encourage you to take your nausea medication as directed.  BELOW ARE SYMPTOMS THAT SHOULD BE REPORTED IMMEDIATELY: *FEVER GREATER THAN 100.4 F (38 C) OR HIGHER *CHILLS OR SWEATING *NAUSEA AND VOMITING THAT IS NOT CONTROLLED WITH YOUR NAUSEA MEDICATION *UNUSUAL SHORTNESS OF BREATH *UNUSUAL BRUISING OR BLEEDING *URINARY PROBLEMS (pain or burning when urinating, or frequent urination) *BOWEL PROBLEMS (unusual diarrhea, constipation, pain near the anus) TENDERNESS IN MOUTH AND THROAT WITH OR WITHOUT PRESENCE OF ULCERS (sore throat, sores in mouth, or a toothache) UNUSUAL RASH, SWELLING OR PAIN  UNUSUAL VAGINAL DISCHARGE OR ITCHING   Items with * indicate a potential emergency and should be followed up as soon as possible or go to the Emergency Department if any problems should occur.  Please show the CHEMOTHERAPY ALERT CARD or IMMUNOTHERAPY ALERT CARD at  check-in to the Emergency Department and triage nurse.  Should you have questions after your visit or need to cancel or reschedule your appointment, please contact [facility]  Dept: [phone]  and follow the prompts.  Office hours are 8:00 a.m. to 4:30 p.m. Monday - Friday. Please note that voicemails left after 4:00 p.m. may not be returned until the following business day.  We are closed weekends and major holidays. You have access to a nurse at all times for urgent questions. Please call the main number to the clinic Dept: [phone] and follow the prompts.   For any non-urgent questions, you may also contact your provider using MyChart. We now offer e-Visits for anyone 74 and older to request care online for non-urgent symptoms. For details visit [email or web address].   Also download the MyChart app! Go to the app store, search "MyChart", open the app, select [place], and log in with your MyChart username and password.  Zoledronic Acid Injection (Cancer) What is this medication? ZOLEDRONIC ACID (ZOE le dron ik AS id) treats high calcium levels in the blood caused by cancer. It may also be used with chemotherapy to treat weakened bones caused by cancer. It works by slowing down the release of calcium from bones. This lowers calcium levels in your blood. It also makes your bones stronger and less likely to break (fracture). It belongs to a group of medications called bisphosphonates. This medicine may be used for other purposes; ask your health care provider or pharmacist if you have questions. COMMON BRAND NAME(S): Zometa, Zometa Powder What  should I tell my care team before I take this medication? They need to know if you have any of these conditions: Dehydration Dental disease Kidney disease Liver disease Low levels of calcium in the blood Lung or breathing disease, such as asthma Receiving steroids, such as dexamethasone or prednisone An  unusual or allergic reaction to zoledronic acid, other medications, foods, dyes, or preservatives Pregnant or trying to get pregnant Breast-feeding How should I use this medication? This medication is injected into a vein. It is given by your care team in a hospital or clinic setting. Talk to your care team about the use of this medication in children. Special care may be needed. Overdosage: If you think you have taken too much of this medicine contact a poison control center or emergency room at once. NOTE: This medicine is only for you. Do not share this medicine with others. What if I miss a dose? Keep appointments for follow-up doses. It is important not to miss your dose. Call your care team if you are unable to keep an appointment. What may interact with this medication? Certain antibiotics given by injection Diuretics, such as bumetanide, furosemide NSAIDs, medications for pain and inflammation, such as ibuprofen or naproxen Teriparatide Thalidomide This list may not describe all possible interactions. Give your health care provider a list of all the medicines, herbs, non-prescription drugs, or dietary supplements you use. Also tell them if you smoke, drink alcohol, or use illegal drugs. Some items may interact with your medicine. What should I watch for while using this medication? Visit your care team for regular checks on your progress. It may be some time before you see the benefit from this medication. Some people who take this medication have severe bone, joint, or muscle pain. This medication may also increase your risk for jaw problems or a broken thigh bone. Tell your care team right away if you have severe pain in your jaw, bones, joints, or muscles. Tell you care team if you have any pain that does not go away or that gets worse. Tell your dentist and dental surgeon that you are taking this medication. You should not have major dental surgery while on this medication. See your  dentist to have a dental exam and fix any dental problems before starting this medication. Take good care of your teeth while on this medication. Make sure you see your dentist for regular follow-up appointments. You should make sure you get enough calcium and vitamin D while you are taking this medication. Discuss the foods you eat and the vitamins you take with your care team. Check with your care team if you have severe diarrhea, nausea, and vomiting, or if you sweat a lot. The loss of too much body fluid may make it dangerous for you to take this medication. You may need bloodwork while taking this medication. Talk to your care team if you wish to become pregnant or think you might be pregnant. This medication can cause serious birth defects. What side effects may I notice from receiving this medication? Side effects that you should report to your care team as soon as possible: Allergic reactions--skin rash, itching, hives, swelling of the face, lips, tongue, or throat Kidney injury--decrease in the amount of urine, swelling of the ankles, hands, or feet Low calcium level--muscle pain or cramps, confusion, tingling, or numbness in the hands or feet Osteonecrosis of the jaw--pain, swelling, or redness in the mouth, numbness of the jaw, poor healing after dental work, unusual  discharge from the mouth, visible bones in the mouth Severe bone, joint, or muscle pain Side effects that usually do not require medical attention (report to your care team if they continue or are bothersome): Constipation Fatigue Fever Loss of appetite Nausea Stomach pain This list may not describe all possible side effects. Call your doctor for medical advice about side effects. You may report side effects to FDA at 1-800-FDA-1088. Where should I keep my medication? This medication is given in a hospital or clinic. It will not be stored at home. NOTE: This sheet is a summary. It may not cover all possible information.  If you have questions about this medicine, talk to your doctor, pharmacist, or health care provider.  2024 [employer]/Gold Standard (2021-06-12 00:00:00)

## 2023-02-09 NOTE — Progress Notes (Signed)
[facility] Telephone:([phone]) [phone]   [phone]) [phone]  PROGRESS NOTE  Patient Care Team: [name], MD as PCP - General (Internal Medicine) Pricilla Riffle, MD as PCP - Cardiology (Cardiology) Quintella Reichert, MD as PCP - Sleep Medicine (Cardiology) Lanier Prude, MD as PCP - Electrophysiology (Cardiology) [name], MD as Consulting Physician (General Surgery) Vida Rigger, MD as Consulting Physician (Gastroenterology) Quintella Reichert, MD as Consulting Physician (Sleep Medicine) Glendale Chard, DO as Consulting Physician (Neurology)  Hematological/Oncological History # IgA Kappa Multiple Myeloma 05/05/2020: Bone marrow biopsy showed increased number of plasma cells representing 14% of all cells in the aspirate associated with numerous variably sized clusters in the clot and biopsy sections 04/10/2021: M protein 0.3, IgA kappa specificity. Kappa 580.5, Labda 7.0, ratio 82.93.   12/07/2021: Labs show of 1971.3, lambda 5.5, ratio 358.42 12/10/2021: Transition care to Dr. Leonides Schanz. 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  01/26/2022: Cycle 2 Day 1 of Dara/Rev/Dex 02/22/2022: Cycle 3 Day 1 of Dara/Rev/Dex 03/23/2022: Cycle 4 Day 1 of Dara/Rev/Dex 04/20/2022: Cycle 5 Day 1 of Dara/Rev/Dex 05/18/2022: Cycle 6 Day 1 of Dara/Rev/Dex 05/27/2022: MRI cervical/thoracic/lumbar: pathologic fracture at T2 with significant osseous retropulsion and resulting cord compression. Additional smaller lesions within T1 vertebral body, right C7 and T3 articulating facets.  Small Multiple Myeloma lesions scattered at all lumbar levels and in the visible left sacral ala. Largest lumbar tumor is 2.3 cm in the left L1 posterior elements and pedicle. No lumbar extraosseous or epidural tumor extension, or pathologic fracture.Underlying chronic lumbar spine  degeneration, with a new central disc herniation at L3-L4 since 2021 now resulting in mild to moderate degenerative spinal stenosis there. 05/28/2022-06/09/2022: Admitted for T2 pathologic fracture and cord compression. He received urgent radiation therapy for a total of 3.0 Gy. He underwent  thoracic 1 through 4 posterior lateral arthrodesis/fusion and 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  06/23/2022: Cycle 1 Day 1 of Velcade/Pomalyst/Dex 08/04/2022: Cycle 2 Day 1 of Velcade/Pomalyst/Dex. Pomalyst to be decreased to 2mg  PO daily due to cytopenias.  09/01/2022: Cycle 3 Day 1 of Velcade/Pomalyst/Dex. (Missed 2nd dose of cycle due to hospitalization)  10/06/2022: Cycle 4 Day 1 of Velcade/Pomalyst/Dex.  10/27/2022: Cycle 5 Day 1 of Velcade/Pomalyst/Dex.  12/02/2022: Cycle 6 Day 1 of Velcade/Pomalyst/Dex. 01/12/2023: Cycle 1 Day 1 of Kyprolis/Dex 02/09/2023: Cycle 2 Day 1 of Kyprolis/Dex    Interval History:  Travis [name] 72 y.o. male with medical history significant for IgA Kappa Multiple Myeloma who presents for a follow up visit. He was last seen on 01/26/2023. In the interim, he continued on Kyprolis/Dex therapy.  On exam today Travis [name] reports he is tolerating treatment without any new symptoms. He has noticed erythematous rash on his forearms recently. The rash is not pruritic or causing any bleeding. His appetite and energy levels are overall stable. He denies nausea, vomiting or bowel habit changes. He denies any overt signs of bleeding.  He denies fevers, chills, sweats, shortness of breath, chest pain or cough. He has no other complaints. Rest of the 10 point ROS is below.     MEDICAL HISTORY:  Past Medical History:  Diagnosis Date   A-fib [facility])    Arthritis    "comes and goes; mostly in hands, occasionally elbow" (04/05/2017)   Complication of anesthesia    "just wanted to sleep alot  for  couple days S/P VARICOCELE OR" (04/05/2017)   DVT (deep venous thrombosis) (HCC)     LLE   Dysrhythmia    PVCs    GERD (gastroesophageal reflux disease)    History of hiatal hernia    History of radiation therapy    Thoracic Spine- 07/13/22-07/23/22-Dr. Antony Blackbird   Hypertension    Impaired glucose tolerance    Multiple myeloma (HCC)    Obesity (BMI 30-39.9) 07/26/2016   OSA on CPAP 07/26/2016   Mild with AHI 12.5/hr now on CPAP at 14cm H2O   PAF (paroxysmal atrial fibrillation) (HCC)    s/p PVI Ablation in 2018 // Flecainide Rx // Echo 8/21: EF 60-65, no RWMA, mild LVH, normal RV SF, moderate RVE, mild BAE, trivial MR, mild dilation of aortic root (40 mm)   Pneumonia    "may have had walking pneumonia a few years ago" (04/05/2017)   Pre-diabetes    Thrombophlebitis     SURGICAL HISTORY: Past Surgical History:  Procedure Laterality Date   ATRIAL FIBRILLATION ABLATION  04/05/2017   ATRIAL FIBRILLATION ABLATION N/A 04/05/2017   Procedure: ATRIAL FIBRILLATION ABLATION;  Surgeon: [name], MD;  Location: [facility];  Service: Cardiovascular;  Laterality: N/A;   BONE BIOPSY Right 12/17/2021   Procedure: BONE BIOPSY;  Surgeon: [name], MD;  Location: [facility];  Service: Orthopedics;  Laterality: Right;   COMPLETE RIGHT HIP REPLACMENT  01/19/2019   EVALUATION UNDER ANESTHESIA WITH HEMORRHOIDECTOMY N/A 02/24/2017   Procedure: EXAM UNDER ANESTHESIA WITH  HEMORRHOIDECTOMY;  Surgeon: [name], MD;  Location: [facility];  Service: General;  Laterality: N/A;   EXCISIONAL TOTAL HIP ARTHROPLASTY WITH ANTIBIOTIC SPACERS Right 09/25/2020   Procedure: EXCISIONAL TOTAL HIP ARTHROPLASTY WITH ANTIBIOTIC SPACERS;  Surgeon: [name], MD;  Location: [facility];  Service: Orthopedics;  Laterality: Right;   FLEXIBLE SIGMOIDOSCOPY N/A 04/15/2015   Procedure:  UNSEDATED FLEXIBLE SIGMOIDOSCOPY;  Surgeon: [name], MD;  Location: [facility];  Service: Endoscopy;  Laterality: N/A;   HUMERUS IM NAIL Right 12/17/2021   Procedure: INTRAMEDULLARY  (IM) NAIL HUMERAL;  Surgeon: [name], MD;  Location: [facility];  Service: Orthopedics;  Laterality: Right;   KNEE ARTHROSCOPY Left ~ 2016   POSTERIOR CERVICAL FUSION/FORAMINOTOMY N/A 06/04/2022   Procedure: Thoracic one - Thoracic Four Posterior lateral arthrodesis/ fusion and Thoracic two Corpectomy;  Surgeon: [name], MD;  Location: [facility];  Service: Neurosurgery;  Laterality: N/A;   REIMPLANTATION OF TOTAL HIP Right 12/25/2020   Procedure: REIMPLANTATION/REVISION OF RIGHT TOTAL HIP VERSUS REPEAT IRRIGATION AND DEBRIDEMENT;  Surgeon: [name], MD;  Location: [facility];  Service: Orthopedics;  Laterality: Right;   TESTICLE SURGERY  1988   VARICOCELE   TONSILLECTOMY     VARICOSE VEIN SURGERY Bilateral     SOCIAL HISTORY: Social History   Socioeconomic History   Marital status: Married    Spouse name: Not on file   Number of children: 1   Years of education: law school   Highest education level: Not on file  Occupational History   Occupation: lawyer  Tobacco Use   Smoking status: Never   Smokeless tobacco: Never  Vaping Use   Vaping status: Never Used  Substance and Sexual Activity   Alcohol use: Yes    Alcohol/week: 2.0 standard drinks of alcohol    Types: 2 Glasses of wine per week    Comment: 2 glasses of wine a week, occ bourbon, beer   Drug use:  No   Sexual activity: Not Currently  Other Topics Concern   Not on file  Social History Narrative   Lives in [place] with spouse in a 2 story home.  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 1 daughter.     Education: Social worker school.    Social Determinants of Health   Financial Resource Strain: Not on file  Food Insecurity: No Food Insecurity (09/27/2022)   Hunger Vital Sign    Worried About Running Out of Food in the Last Year: Never true    Ran Out of Food in the Last Year: Never true  Transportation Needs: No Transportation Needs (09/27/2022)   PRAPARE - Doctor, general practice (Medical): No    Lack of Transportation (Non-Medical): No  Physical Activity: Not on file  Stress: Not on file  Social Connections: Unknown (09/15/2021)   [facility]   Social Network    Social Network: Not on file  Intimate Partner Violence: Not At Risk (09/27/2022)   Humiliation, Afraid, Rape, and Kick questionnaire    Fear of Current or Ex-Partner: No    Emotionally Abused: No    Physically Abused: No    Sexually Abused: No    FAMILY HISTORY: Family History  Problem Relation Age of Onset   Dementia Mother    Stroke Father    Atrial fibrillation Father    Hypertension Father    Hypertension Paternal Grandfather    Dementia Maternal Grandmother     ALLERGIES:  is allergic to linezolid and vancomycin.  MEDICATIONS:  Current Outpatient Medications  Medication Sig Dispense Refill   acyclovir (ZOVIRAX) 400 MG tablet Take 1 tablet (400 mg total) by mouth daily. 30 tablet 3   albuterol (VENTOLIN HFA) 108 (90 Base) MCG/ACT inhaler Inhale 2 puffs into the lungs every 6 (six) hours as needed for wheezing or shortness of breath. 8 g 0   amiodarone (PACERONE) 200 MG tablet Take 1 tablet (200 mg total) by mouth daily. Start after finishing 40 mg twice daily dose of  days (Patient taking differently: Take 200 mg by mouth daily.) 180 tablet 1   calcium carbonate (TUMS - DOSED IN MG ELEMENTAL CALCIUM) 500 MG chewable tablet Chew 1 tablet by mouth daily.     cholecalciferol (VITAMIN D3) 25 MCG (1000 UNIT) tablet Take 1,000 Units by mouth daily.     Cyanocobalamin (VITAMIN B-12 PO) Take 1,000 mcg by mouth daily.     dexamethasone (DECADRON) 4 MG tablet Take 10 tablets (40 mg total) by mouth every 7 (seven) days. Take 40 mg by mouth once a day in the morning of chemo on Wednesdays- or unless otherwise instructed (Patient taking differently: Take 5 tablets by mouth every 7 (seven) days. Take 5 tabs (20 mg) by mouth once a day in the morning of chemo  on Wednesdays- or unless otherwise instructed) 40 tablet 2   ELIQUIS 5 MG TABS tablet TAKE ONE TABLET BY MOUTH TWICE A DAY 60 tablet 6   fluticasone (FLONASE) 50 MCG/ACT nasal spray Place 1 spray into both nostrils daily as needed for allergies or rhinitis.     gabapentin (NEURONTIN) 300 MG capsule Take 2 capsules (600 mg total) by mouth 2 (two) times daily. 360 capsule 1   hydrALAZINE (APRESOLINE) 50 MG tablet Take 2 tablets (100 mg total) by mouth 3 (three) times daily.     ketoconazole (NIZORAL) 2 % cream as needed for irritation.  metolazone (ZAROXOLYN) 2.5 MG tablet Take 1 tablet (2.5 mg total) by mouth every other day.     metoprolol succinate (TOPROL-XL) 50 MG 24 hr tablet Take 1/2 to 1 as needed up to twice a day for AFIB and heart rate above 100. 90 tablet 3   polyethylene glycol powder (GLYCOLAX/MIRALAX) 17 GM/SCOOP powder Take 1 g by mouth daily.     potassium chloride SA (KLOR-CON M) 20 MEQ tablet Take 3 tablets (60 mEq total) by mouth 3 (three) times daily. Take an extra 2 tablets on the day you take the Metolazone (80 meq total)     psyllium (METAMUCIL) 58.6 % powder Take by mouth at bedtime. 3 tsp daily hs     sodium chloride (OCEAN) 0.65 % SOLN nasal spray Place 1 spray into both nostrils as needed for congestion.     spironolactone (ALDACTONE) 25 MG tablet Take 1 tablet (25 mg total) by mouth daily.     TART CHERRY PO Take 1 tablet by mouth at bedtime.     torsemide (DEMADEX) 20 MG tablet Take 2 tablets (40 mg total) by mouth daily. 90 tablet 3   No current facility-administered medications for this visit.    REVIEW OF SYSTEMS:   Constitutional: ( - ) fevers, ( - )  chills , ( - ) night sweats Eyes: ( - ) blurriness of vision, ( - ) double vision, ( - ) watery eyes Ears, nose, mouth, throat, and face: ( - ) mucositis, ( - ) sore throat Respiratory: ( - ) cough, ( - ) dyspnea, ( - ) wheezes Cardiovascular: ( - ) palpitation, ( - ) chest discomfort, ( - ) lower extremity  swelling Gastrointestinal:  ( - ) nausea, ( - ) heartburn, ( - ) change in bowel habits Skin: ( - ) abnormal skin rashes Lymphatics: ( - ) new lymphadenopathy, ( - ) easy bruising Neurological: ( - ) numbness, ( - ) tingling, ( - ) new weaknesses Behavioral/Psych: ( - ) mood change, ( - ) new changes  All other systems were reviewed with the patient and are negative.  PHYSICAL EXAMINATION: ECOG PERFORMANCE STATUS: 1 - Symptomatic but completely ambulatory  Vitals:   02/09/23 1154 02/09/23 1155  BP: (!) 141/55 (!) 137/59  Pulse: (!) 56   Resp: 18   Temp: 98.1 F (36.7 C)   SpO2: 97%       Filed Weights   02/09/23 1154  Weight: 260 lb 4.8 oz (118.1 kg)   GENERAL: Well-appearing elderly Caucasian male, alert, no distress and comfortable SKIN: no overt abnormalities of the skin.  EYES: conjunctiva are pink and non-injected, sclera clear LUNGS: clear to auscultation and percussion with normal breathing effort HEART: tachycardic &  no murmurs. +bilateral trace lower extremity edema Musculoskeletal: no cyanosis of digits and no clubbing  PSYCH: alert & oriented x 3, fluent speech NEURO: no focal motor/sensory deficits  LABORATORY DATA:  I have reviewed the data as listed    Latest Ref Rng & Units 02/09/2023   11:22 AM 01/26/2023   10:20 AM 01/19/2023   12:49 PM  CBC  WBC 4.0 - 10.5 K/uL 10.3  9.8  13.4   Hemoglobin 13.0 - 17.0 g/dL 86.5  78.4  69.6   Hematocrit 39.0 - 52.0 % 38.6  36.1  37.3   Platelets 150 - 400 K/uL 246  161  257        Latest Ref Rng & Units 02/09/2023   11:22 AM 02/03/2023  3:09 PM 01/27/2023    2:15 PM  CMP  Glucose 70 - 99 mg/dL 161  69  096   BUN 8 - 23 mg/dL 33  26  64   Creatinine 0.61 - 1.24 mg/dL 0.45  4.09  8.11   Sodium 135 - 145 mmol/L 141  146  139   Potassium 3.5 - 5.1 mmol/L 3.6  4.1  3.6   Chloride 98 - 111 mmol/L 103  106  101   CO2 22 - 32 mmol/L 30  24  25    Calcium 8.9 - 10.3 mg/dL 9.7  8.5  9.7   Total Protein 6.5 - 8.1 g/dL  6.4     Total Bilirubin 0.3 - 1.2 mg/dL 0.4     Alkaline Phos 38 - 126 U/L 75     AST 15 - 41 U/L 17     ALT 0 - 44 U/L 18       Lab Results  Component Value Date   MPROTEIN Not Observed 12/29/2022   MPROTEIN Not Observed 12/02/2022   MPROTEIN Not Observed 11/03/2022   Lab Results  Component Value Date   KPAFRELGTCHN 97.8 (H) 12/29/2022   KPAFRELGTCHN 94.2 (H) 12/02/2022   KPAFRELGTCHN 44.5 (H) 11/03/2022   LAMBDASER 15.9 12/29/2022   LAMBDASER 20.3 12/02/2022   LAMBDASER 13.5 11/03/2022   KAPLAMBRATIO 6.15 (H) 12/29/2022   KAPLAMBRATIO 4.64 (H) 12/02/2022   KAPLAMBRATIO 3.30 (H) 11/03/2022     RADIOGRAPHIC STUDIES: No results found.  ASSESSMENT & PLAN Travis [name] is a 72 y.o. male with medical history significant for IgA Kappa Multiple Myeloma who presents for a follow up visit.   # IgA Kappa Multiple Myeloma --Metastatic survey completed 12/15/2021 --Patient underwent fixation of his humerus on 12/17/2021, pathology confirmed plasmacytoma/plasma cell neoplasm.  --Received 6 cycles of Darzalex, Revlimid, and dexamethasone started on 12/28/2021-05/18/2022.  --Due to worsening myeloma osseous lesions including pathologic fracture at T2 with cord compression, Dr. Leonides Schanz recommends changing therapy to Velcade/Pomalyst/Dex.  --Due to rising kappa free light chains, recommend to change therapy to Carfilzomib/Dex started on 01/12/2023. Plan: --Due for Cycle 2, Day 1 of Carfilzomib/Dex today --labs from today showed white blood cell 10.3, hemoglobin 12.7, MCV 95.1, and platelets of 246. Creatinine and calcium level normal.  --Proceed with treatment today without any dose modifications.  --RTC on 03/09/2023 for next toxicity check as patient is traveling out of the country in a couple of weeks for vacation.    #Neck pain and mid back pain: --Suspect pain is exacerbated from recent hospitalization and being in bed/sitting for long periods.  --MRI C/T/L spine obtinaed that showed  unchanged lytic lesions in the right C7 inferior articular process,T6, T8, and T10  vertebral bodies, as well  as the T11 spinous process. Unchanged  multilevel cervical spondylsis, Interval enlargement of disc herniation at L3-L4 with progressive spinal canal stenosis. No new or progressive lytic lesions.  --Encouraged to continue with physical therapy and follow up with ortho.   #Palliative Radiation Therapy: # Dysphagia --Received palliative radiation from 01/06/2022-01/19/2022 to right hymerus, right forearm and right chest/rib, 20 Gy in 10 Fx.  --recieved palliative radiation to surgical bed in the upper thoracic spine area on 07/13/2022.  27 Gy in 9 Fx.   #Lower extremity edema--improved: --Currently on lasix and torsemide  # Rash Concerning for Shingles-resolved -- Prescribed valacyclovir 3 times daily 1000 mg x 10 days-completed the course -- We will continue to monitor rash closely.  #DVT: --Found to have acute  DVT involving left peroneal veins. Likely secondary to recent surgery and hospitalization --Currently on eliquis 5 mg BID.    #Hypokalemia: --Potassium level is 3.6 today. Advised to continue with 40 mEq twice daily and increase to 60 mEq on the days he takes torsemide/spironolactone  #Rash on scalp and right forearm: --Recommend topical hydrocortisone cream for spot treatment  #Supportive Care -- chemotherapy education complete -- port placement not required -- zofran 8mg  q8H PRN and compazine 10mg  PO q6H for nausea -- acyclovir 400mg  PO BID for VCZ prophylaxis -- Started Zometa q 12 weeks on 11/10/2022. Next dose due today.   Orders Placed This Encounter  Procedures   Multiple Myeloma Panel (SPEP&IFE w/QIG)    Standing Status:   Future    Standing Expiration Date:   02/16/2024   Kappa/lambda light chains    Standing Status:   Future    Standing Expiration Date:   02/16/2024   Multiple Myeloma Panel (SPEP&IFE w/QIG)    Standing Status:   Future    Standing  Expiration Date:   03/08/2024   Kappa/lambda light chains    Standing Status:   Future    Standing Expiration Date:   03/08/2024   All questions were answered. The patient knows to call the clinic with any problems, questions or concerns.  A total of more than 30 minutes were spent on this encounter with face-to-face time and non-face-to-face time, including preparing to see the patient, ordering tests and/or medications, counseling the patient and coordination of care as outlined above.   Georga Kaufmann PA-C Dept of Hematology and Oncology [facility] Phone: [phone]   02/09/2023 3:58 PM

## 2023-02-10 ENCOUNTER — Ambulatory Visit: Payer: Medicare Other | Admitting: Internal Medicine

## 2023-02-10 LAB — MISC LABCORP TEST (SEND OUT)

## 2023-02-11 ENCOUNTER — Encounter: Payer: Self-pay | Admitting: Internal Medicine

## 2023-02-11 ENCOUNTER — Ambulatory Visit: Payer: Medicare Other | Admitting: Physical Therapy

## 2023-02-15 ENCOUNTER — Telehealth: Payer: Self-pay

## 2023-02-15 NOTE — Telephone Encounter (Signed)
Pt called stating that he has been experiencing L sided back pain since last night. He describes the pain as sharp with spasms. The pain starts in his upper back, to his side of his ribcage and then to the front. He states the pain is an 8 or 9 out of 10 and has taken tylenol in attempt to relieve the pain but it has not helped much. This RN suggested being seen by [facility] clinic tomorrow before his lab and infusion appts- pt agreeable.   This RN reached out to [facility] clinic. Pt scheduled for appt with [facility] tomorrow at 01:30 pm. This RN LVM with appt details.

## 2023-02-16 ENCOUNTER — Encounter: Payer: Self-pay | Admitting: Internal Medicine

## 2023-02-16 ENCOUNTER — Inpatient Hospital Stay: Payer: Medicare Other

## 2023-02-16 ENCOUNTER — Inpatient Hospital Stay (HOSPITAL_BASED_OUTPATIENT_CLINIC_OR_DEPARTMENT_OTHER): Payer: Medicare Other | Admitting: Physician Assistant

## 2023-02-16 VITALS — BP 127/63 | HR 50 | Temp 97.6°F | Resp 18 | Wt 261.2 lb

## 2023-02-16 VITALS — BP 120/53 | HR 52 | Temp 98.0°F | Resp 18

## 2023-02-16 DIAGNOSIS — Z9089 Acquired absence of other organs: Secondary | ICD-10-CM | POA: Diagnosis not present

## 2023-02-16 DIAGNOSIS — Z86718 Personal history of other venous thrombosis and embolism: Secondary | ICD-10-CM | POA: Diagnosis not present

## 2023-02-16 DIAGNOSIS — I5033 Acute on chronic diastolic (congestive) heart failure: Secondary | ICD-10-CM | POA: Diagnosis not present

## 2023-02-16 DIAGNOSIS — G4733 Obstructive sleep apnea (adult) (pediatric): Secondary | ICD-10-CM | POA: Diagnosis not present

## 2023-02-16 DIAGNOSIS — C9000 Multiple myeloma not having achieved remission: Secondary | ICD-10-CM

## 2023-02-16 DIAGNOSIS — R1310 Dysphagia, unspecified: Secondary | ICD-10-CM | POA: Diagnosis not present

## 2023-02-16 DIAGNOSIS — M545 Low back pain, unspecified: Secondary | ICD-10-CM | POA: Diagnosis not present

## 2023-02-16 DIAGNOSIS — I480 Paroxysmal atrial fibrillation: Secondary | ICD-10-CM | POA: Diagnosis not present

## 2023-02-16 DIAGNOSIS — Z7961 Long term (current) use of immunomodulator: Secondary | ICD-10-CM | POA: Diagnosis not present

## 2023-02-16 DIAGNOSIS — Z7901 Long term (current) use of anticoagulants: Secondary | ICD-10-CM | POA: Diagnosis not present

## 2023-02-16 DIAGNOSIS — Z5112 Encounter for antineoplastic immunotherapy: Secondary | ICD-10-CM | POA: Diagnosis not present

## 2023-02-16 DIAGNOSIS — Z923 Personal history of irradiation: Secondary | ICD-10-CM | POA: Diagnosis not present

## 2023-02-16 DIAGNOSIS — I10 Essential (primary) hypertension: Secondary | ICD-10-CM | POA: Diagnosis not present

## 2023-02-16 DIAGNOSIS — Z79899 Other long term (current) drug therapy: Secondary | ICD-10-CM | POA: Diagnosis not present

## 2023-02-16 DIAGNOSIS — M48061 Spinal stenosis, lumbar region without neurogenic claudication: Secondary | ICD-10-CM | POA: Diagnosis not present

## 2023-02-16 DIAGNOSIS — Z881 Allergy status to other antibiotic agents status: Secondary | ICD-10-CM | POA: Diagnosis not present

## 2023-02-16 DIAGNOSIS — Z7952 Long term (current) use of systemic steroids: Secondary | ICD-10-CM | POA: Diagnosis not present

## 2023-02-16 DIAGNOSIS — M5126 Other intervertebral disc displacement, lumbar region: Secondary | ICD-10-CM | POA: Diagnosis not present

## 2023-02-16 DIAGNOSIS — M533 Sacrococcygeal disorders, not elsewhere classified: Secondary | ICD-10-CM | POA: Diagnosis not present

## 2023-02-16 DIAGNOSIS — Z7969 Long term (current) use of other immunomodulators and immunosuppressants: Secondary | ICD-10-CM | POA: Diagnosis not present

## 2023-02-16 DIAGNOSIS — R21 Rash and other nonspecific skin eruption: Secondary | ICD-10-CM | POA: Diagnosis not present

## 2023-02-16 DIAGNOSIS — M546 Pain in thoracic spine: Secondary | ICD-10-CM | POA: Diagnosis not present

## 2023-02-16 DIAGNOSIS — Z8249 Family history of ischemic heart disease and other diseases of the circulatory system: Secondary | ICD-10-CM | POA: Diagnosis not present

## 2023-02-16 DIAGNOSIS — Z79624 Long term (current) use of inhibitors of nucleotide synthesis: Secondary | ICD-10-CM | POA: Diagnosis not present

## 2023-02-16 DIAGNOSIS — E876 Hypokalemia: Secondary | ICD-10-CM | POA: Diagnosis not present

## 2023-02-16 LAB — CBC WITH DIFFERENTIAL (CANCER CENTER ONLY)
Abs Immature Granulocytes: 0.04 10*3/uL (ref 0.00–0.07)
Basophils Absolute: 0 10*3/uL (ref 0.0–0.1)
Basophils Relative: 0 %
Eosinophils Absolute: 0.1 10*3/uL (ref 0.0–0.5)
Eosinophils Relative: 2 %
HCT: 35.4 % — ABNORMAL LOW (ref 39.0–52.0)
Hemoglobin: 12 g/dL — ABNORMAL LOW (ref 13.0–17.0)
Immature Granulocytes: 1 %
Lymphocytes Relative: 11 %
Lymphs Abs: 0.9 10*3/uL (ref 0.7–4.0)
MCH: 31.7 pg (ref 26.0–34.0)
MCHC: 33.9 g/dL (ref 30.0–36.0)
MCV: 93.4 fL (ref 80.0–100.0)
Monocytes Absolute: 1 10*3/uL (ref 0.1–1.0)
Monocytes Relative: 12 %
Neutro Abs: 6.3 10*3/uL (ref 1.7–7.7)
Neutrophils Relative %: 74 %
Platelet Count: 145 10*3/uL — ABNORMAL LOW (ref 150–400)
RBC: 3.79 MIL/uL — ABNORMAL LOW (ref 4.22–5.81)
RDW: 17.4 % — ABNORMAL HIGH (ref 11.5–15.5)
WBC Count: 8.5 10*3/uL (ref 4.0–10.5)
nRBC: 0 % (ref 0.0–0.2)

## 2023-02-16 LAB — CMP (CANCER CENTER ONLY)
ALT: 13 U/L (ref 0–44)
AST: 13 U/L — ABNORMAL LOW (ref 15–41)
Albumin: 3.9 g/dL (ref 3.5–5.0)
Alkaline Phosphatase: 69 U/L (ref 38–126)
Anion gap: 9 (ref 5–15)
BUN: 33 mg/dL — ABNORMAL HIGH (ref 8–23)
CO2: 30 mmol/L (ref 22–32)
Calcium: 9.2 mg/dL (ref 8.9–10.3)
Chloride: 102 mmol/L (ref 98–111)
Creatinine: 1.05 mg/dL (ref 0.61–1.24)
GFR, Estimated: >60 mL/min (ref >60)
Glucose, Bld: 117 mg/dL — ABNORMAL HIGH (ref 70–99)
Potassium: 3.2 mmol/L — ABNORMAL LOW (ref 3.5–5.1)
Sodium: 141 mmol/L (ref 135–145)
Total Bilirubin: 0.5 mg/dL (ref 0.3–1.2)
Total Protein: 6.3 g/dL — ABNORMAL LOW (ref 6.5–8.1)

## 2023-02-16 MED ORDER — DEXTROSE 5 % IV SOLN
70.0000 mg/m2 | Freq: Once | INTRAVENOUS | Status: AC
  Administered 2023-02-16: 150 mg via INTRAVENOUS
  Filled 2023-02-16: qty 75

## 2023-02-16 MED ORDER — SODIUM CHLORIDE 0.9 % IV SOLN: Freq: Once | INTRAVENOUS | Status: AC

## 2023-02-16 MED ORDER — SODIUM CHLORIDE 0.9 % IV SOLN
20.0000 mg | Freq: Once | INTRAVENOUS | Status: AC
  Administered 2023-02-16: 20 mg via INTRAVENOUS
  Filled 2023-02-16: qty 20

## 2023-02-16 NOTE — Progress Notes (Signed)
Pt didn't take dexamethasone at home. Added dex 20mg  IV x 1.

## 2023-02-16 NOTE — Patient Instructions (Signed)
[facility]  Discharge Instructions: Thank you for choosing [facility] to provide your oncology and hematology care.   If you have a lab appointment with the Cancer Center, please go directly to the Cancer Center and check in at the registration area.   Wear comfortable clothing and clothing appropriate for easy access to any Portacath or PICC line.   We strive to give you quality time with your provider. You may need to reschedule your appointment if you arrive late (15 or more minutes).  Arriving late affects you and other patients whose appointments are after yours.  Also, if you miss three or more appointments without notifying the office, you may be dismissed from the clinic at the provider's discretion.      For prescription refill requests, have your pharmacy contact our office and allow 72 hours for refills to be completed.    Today you received the following chemotherapy and/or immunotherapy agents : Kyprolis      To help prevent nausea and vomiting after your treatment, we encourage you to take your nausea medication as directed.  BELOW ARE SYMPTOMS THAT SHOULD BE REPORTED IMMEDIATELY: *FEVER GREATER THAN 100.4 F (38 C) OR HIGHER *CHILLS OR SWEATING *NAUSEA AND VOMITING THAT IS NOT CONTROLLED WITH YOUR NAUSEA MEDICATION *UNUSUAL SHORTNESS OF BREATH *UNUSUAL BRUISING OR BLEEDING *URINARY PROBLEMS (pain or burning when urinating, or frequent urination) *BOWEL PROBLEMS (unusual diarrhea, constipation, pain near the anus) TENDERNESS IN MOUTH AND THROAT WITH OR WITHOUT PRESENCE OF ULCERS (sore throat, sores in mouth, or a toothache) UNUSUAL RASH, SWELLING OR PAIN  UNUSUAL VAGINAL DISCHARGE OR ITCHING   Items with * indicate a potential emergency and should be followed up as soon as possible or go to the Emergency Department if any problems should occur.  Please show the CHEMOTHERAPY ALERT CARD or IMMUNOTHERAPY ALERT CARD at  check-in to the Emergency Department and triage nurse.  Should you have questions after your visit or need to cancel or reschedule your appointment, please contact [facility]  Dept: [phone]  and follow the prompts.  Office hours are 8:00 a.m. to 4:30 p.m. Monday - Friday. Please note that voicemails left after 4:00 p.m. may not be returned until the following business day.  We are closed weekends and major holidays. You have access to a nurse at all times for urgent questions. Please call the main number to the clinic Dept: [phone] and follow the prompts.   For any non-urgent questions, you may also contact your provider using MyChart. We now offer e-Visits for anyone 48 and older to request care online for non-urgent symptoms. For details visit [email or web address].   Also download the MyChart app! Go to the app store, search "MyChart", open the app, select [facility], and log in with your MyChart username and password.  Zoledronic Acid Injection (Cancer) What is this medication? ZOLEDRONIC ACID (ZOE le dron ik AS id) treats high calcium levels in the blood caused by cancer. It may also be used with chemotherapy to treat weakened bones caused by cancer. It works by slowing down the release of calcium from bones. This lowers calcium levels in your blood. It also makes your bones stronger and less likely to break (fracture). It belongs to a group of medications called bisphosphonates. This medicine may be used for other purposes; ask your health care provider or pharmacist if you have questions. COMMON BRAND NAME(S): Zometa, Zometa Powder What  should I tell my care team before I take this medication? They need to know if you have any of these conditions: Dehydration Dental disease Kidney disease Liver disease Low levels of calcium in the blood Lung or breathing disease, such as asthma Receiving steroids, such as dexamethasone or prednisone An  unusual or allergic reaction to zoledronic acid, other medications, foods, dyes, or preservatives Pregnant or trying to get pregnant Breast-feeding How should I use this medication? This medication is injected into a vein. It is given by your care team in a hospital or clinic setting. Talk to your care team about the use of this medication in children. Special care may be needed. Overdosage: If you think you have taken too much of this medicine contact a poison control center or emergency room at once. NOTE: This medicine is only for you. Do not share this medicine with others. What if I miss a dose? Keep appointments for follow-up doses. It is important not to miss your dose. Call your care team if you are unable to keep an appointment. What may interact with this medication? Certain antibiotics given by injection Diuretics, such as bumetanide, furosemide NSAIDs, medications for pain and inflammation, such as ibuprofen or naproxen Teriparatide Thalidomide This list may not describe all possible interactions. Give your health care provider a list of all the medicines, herbs, non-prescription drugs, or dietary supplements you use. Also tell them if you smoke, drink alcohol, or use illegal drugs. Some items may interact with your medicine. What should I watch for while using this medication? Visit your care team for regular checks on your progress. It may be some time before you see the benefit from this medication. Some people who take this medication have severe bone, joint, or muscle pain. This medication may also increase your risk for jaw problems or a broken thigh bone. Tell your care team right away if you have severe pain in your jaw, bones, joints, or muscles. Tell you care team if you have any pain that does not go away or that gets worse. Tell your dentist and dental surgeon that you are taking this medication. You should not have major dental surgery while on this medication. See your  dentist to have a dental exam and fix any dental problems before starting this medication. Take good care of your teeth while on this medication. Make sure you see your dentist for regular follow-up appointments. You should make sure you get enough calcium and vitamin D while you are taking this medication. Discuss the foods you eat and the vitamins you take with your care team. Check with your care team if you have severe diarrhea, nausea, and vomiting, or if you sweat a lot. The loss of too much body fluid may make it dangerous for you to take this medication. You may need bloodwork while taking this medication. Talk to your care team if you wish to become pregnant or think you might be pregnant. This medication can cause serious birth defects. What side effects may I notice from receiving this medication? Side effects that you should report to your care team as soon as possible: Allergic reactions--skin rash, itching, hives, swelling of the face, lips, tongue, or throat Kidney injury--decrease in the amount of urine, swelling of the ankles, hands, or feet Low calcium level--muscle pain or cramps, confusion, tingling, or numbness in the hands or feet Osteonecrosis of the jaw--pain, swelling, or redness in the mouth, numbness of the jaw, poor healing after dental work, unusual  discharge from the mouth, visible bones in the mouth Severe bone, joint, or muscle pain Side effects that usually do not require medical attention (report to your care team if they continue or are bothersome): Constipation Fatigue Fever Loss of appetite Nausea Stomach pain This list may not describe all possible side effects. Call your doctor for medical advice about side effects. You may report side effects to FDA at 1-800-FDA-1088. Where should I keep my medication? This medication is given in a hospital or clinic. It will not be stored at home. NOTE: This sheet is a summary. It may not cover all possible information.  If you have questions about this medicine, talk to your doctor, pharmacist, or health care provider.  2024 Elsevier/Gold Standard (2021-06-12 00:00:00)

## 2023-02-16 NOTE — Progress Notes (Signed)
Patient forgot to take his steroid at home. Given IV in clinic.

## 2023-02-16 NOTE — Progress Notes (Signed)
Symptom Management Consult Note [facility]    Patient Care Team: Travis Aspen, MD as PCP - General (Internal Medicine) Travis Riffle, MD as PCP - Cardiology (Cardiology) Travis Reichert, MD as PCP - Sleep Medicine (Cardiology) Travis Prude, MD as PCP - Electrophysiology (Cardiology) Travis Soda, MD as Consulting Physician (General Surgery) Travis Rigger, MD as Consulting Physician (Gastroenterology) Travis Reichert, MD as Consulting Physician (Sleep Medicine) Travis Chard, DO as Consulting Physician (Neurology)    Name / MRN / DOB: Travis [name]  [id number]  [birth year]   Date of visit: 02/16/2023   Chief Complaint/Reason for visit: back pain   Current Therapy: Kyprolis  Last treatment:  Day 1   Cycle 2 on 02/09/23   ASSESSMENT & PLAN: Patient is a 72 y.o. male with oncologic history of multiple myeloma not having achieved remission followed by Dr. Leonides Schanz.  I have viewed most recent oncology note and lab work.    #Multiple myeloma not having achieved remission - Here today for scheduled treatment   #Back pain -Per chart review patient reported back pain at last clinic visit and was encouraged to continue PT and follow up with orthopedics. Last imaging was CT thoracic and lumbar spine on 12/30/22 with results of Similar a numerable lytic lesions throughout the thoracic and lumbar spine. -Per HPI pain is consistent with muscular strain. Lower extremity exam is normal.  -Encouraged OTC symptom management. Discussed with patient if pain persists or worsens would consider imaging at that time to rule out new lytic lesions vs pathologic fracture. -Patient agreeable with plan of care.   Strict ED precautions discussed should symptoms worsen.   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 Initial Diagnosis   Smoldering multiple myeloma (HCC)   09/13/2017 Tumor Marker   tProt 6.8, Alb 3.8, Ca 9.3, Cr 0.9,  AP 98 LDH 184, beta-2 microglobulin 1.4 SPEP -- M-spike 0.5g/dL, SIFE -- IgA kappa IgG 519, IgA 544, IgM 29; kappa 119.3, lambda 8.5, KLR 14.04 WBC 7.6, Hgb 14.6, Plt 208   Multiple myeloma (HCC)  12/10/2021 Initial Diagnosis   Multiple myeloma (HCC)   12/28/2021 - 05/18/2022 Chemotherapy   Patient is on Treatment Plan : MYELOMA NEWLY DIAGNOSED Daratumumab IV + Lenalidomide + Dexamethasone Weekly (DaraRd) q28d     06/23/2022 - 01/05/2023 Chemotherapy   Patient is on Treatment Plan : MYELOMA  Bortezomib SQ weekly + Dexamethasone weekly + Pomalidomide q28d     01/12/2023 -  Chemotherapy   Patient is on Treatment Plan : MYELOMA RELAPSED/REFRACTORY Carfilzomib (20/70) D1,8,15 + Dexamethasone weekly (40) (Kd) q28d  x 9 cycles / Dexamethasone D1,8,15         Interval history-: Travis [name] is a 72 y.o. male with oncologic history as above presenting to [facility] today with chief complaint of back pain. He is accompanied by spouse who provides additional history.  Patient reports pain started yesterday. Pain is located in the middle of the left side of his back and radiates down. Pain was constant and he describes it as cramping. Pain was 9/10 yesterday. He used applied a heating pad and took tylenol last night. Today he reports pain has significantly improved today, now only 2/10 in severity. Patient states he think he over did his bicep curls x2 days before the pain started. He uses a resistance band to do these and was working harder than he typically does. He denies any fever, chills, urinary symptoms.  ROS  All other systems are reviewed and are negative for acute change except as noted in the HPI.    Allergies  Allergen Reactions  . Linezolid Other (See Comments)    Burning tongue   . Vancomycin Rash    Other Reaction(s): rash, itching, fever, chills     Past Medical History:  Diagnosis Date  . A-fib (HCC)   . Arthritis    "comes and goes; mostly in hands, occasionally  elbow" (04/05/2017)  . Complication of anesthesia    "just wanted to sleep alot  for couple days S/P VARICOCELE OR" (04/05/2017)  . DVT (deep venous thrombosis) (HCC)    LLE  . Dysrhythmia    PVCs   . GERD (gastroesophageal reflux disease)   . History of hiatal hernia   . History of radiation therapy    Thoracic Spine- 07/13/22-07/23/22-Dr. Antony Blackbird  . Hypertension   . Impaired glucose tolerance   . Multiple myeloma (HCC)   . Obesity (BMI 30-39.9) 07/26/2016  . OSA on CPAP 07/26/2016   Mild with AHI 12.5/hr now on CPAP at 14cm H2O  . PAF (paroxysmal atrial fibrillation) (HCC)    s/p PVI Ablation in 2018 // Flecainide Rx // Echo 8/21: EF 60-65, no RWMA, mild LVH, normal RV SF, moderate RVE, mild BAE, trivial MR, mild dilation of aortic root (40 mm)  . Pneumonia    "may have had walking pneumonia a few years ago" (04/05/2017)  . Pre-diabetes   . Thrombophlebitis      Past Surgical History:  Procedure Laterality Date  . ATRIAL FIBRILLATION ABLATION  04/05/2017  . ATRIAL FIBRILLATION ABLATION N/A 04/05/2017   Procedure: ATRIAL FIBRILLATION ABLATION;  Surgeon: [name], MD;  Location: [facility];  Service: Cardiovascular;  Laterality: N/A;  . BONE BIOPSY Right 12/17/2021   Procedure: BONE BIOPSY;  Surgeon: [name], MD;  Location: [facility];  Service: Orthopedics;  Laterality: Right;  . COMPLETE RIGHT HIP REPLACMENT  01/19/2019  . EVALUATION UNDER ANESTHESIA WITH HEMORRHOIDECTOMY N/A 02/24/2017   Procedure: EXAM UNDER ANESTHESIA WITH  HEMORRHOIDECTOMY;  Surgeon: Travis Soda, MD;  Location: [facility];  Service: General;  Laterality: N/A;  . EXCISIONAL TOTAL HIP ARTHROPLASTY WITH ANTIBIOTIC SPACERS Right 09/25/2020   Procedure: EXCISIONAL TOTAL HIP ARTHROPLASTY WITH ANTIBIOTIC SPACERS;  Surgeon: [name], MD;  Location: [facility];  Service: Orthopedics;  Laterality: Right;  . FLEXIBLE SIGMOIDOSCOPY N/A 04/15/2015   Procedure:  UNSEDATED FLEXIBLE  SIGMOIDOSCOPY;  Surgeon: [name], MD;  Location: [facility];  Service: Endoscopy;  Laterality: N/A;  . HUMERUS IM NAIL Right 12/17/2021   Procedure: INTRAMEDULLARY (IM) NAIL HUMERAL;  Surgeon: [name], MD;  Location: [facility];  Service: Orthopedics;  Laterality: Right;  . KNEE ARTHROSCOPY Left ~ 2016  . POSTERIOR CERVICAL FUSION/FORAMINOTOMY N/A 06/04/2022   Procedure: Thoracic one - Thoracic Four Posterior lateral arthrodesis/ fusion and Thoracic two Corpectomy;  Surgeon: [name], MD;  Location: [facility];  Service: Neurosurgery;  Laterality: N/A;  . REIMPLANTATION OF TOTAL HIP Right 12/25/2020   Procedure: REIMPLANTATION/REVISION OF RIGHT TOTAL HIP VERSUS REPEAT IRRIGATION AND DEBRIDEMENT;  Surgeon: [name], MD;  Location: [facility];  Service: Orthopedics;  Laterality: Right;  . 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 TONSILLECTOMY    . VARICOSE VEIN SURGERY Bilateral     Social History   Socioeconomic History  . Marital status: Married    Spouse name: Not on file  . Number  of children: 1  . Years of education: law school  . Highest education level: Not on file  Occupational History  . Occupation: Clinical research associate  Tobacco Use  . Smoking status: Never  . Smokeless tobacco: Never  Vaping Use  . Vaping status: Never Used  Substance and Sexual Activity  . Alcohol use: Yes    Alcohol/week: 2.0 standard drinks of alcohol    Types: 2 Glasses of wine per week    Comment: 2 glasses of wine a week, occ bourbon, beer  . Drug use: No  . Sexual activity: Not Currently  Other Topics Concern  . Not on file  Social History Narrative   Lives in [place] with spouse in a 2 story home.  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 1 daughter.     Education: Social worker school.    Social Determinants of Health   Financial Resource Strain: Not on file  Food Insecurity: No Food Insecurity (09/27/2022)   Hunger Vital Sign   . Worried About Brewing technologist in the Last Year: Never true   . Ran Out of Food in the Last Year: Never true  Transportation Needs: No Transportation Needs (09/27/2022)   PRAPARE - Transportation   . Lack of Transportation (Medical): No   . Lack of Transportation (Non-Medical): No  Physical Activity: Not on file  Stress: Not on file  Social Connections: Unknown (09/15/2021)   Received [facility], [facility]   Social Network   . Social Network: Not on file  Intimate Partner Violence: Not At Risk (09/27/2022)   Humiliation, Afraid, Rape, and Kick questionnaire   . Fear of Current or Ex-Partner: No   . Emotionally Abused: No   . Physically Abused: No   . Sexually Abused: No    Family History  Problem Relation Age of Onset  . Dementia Mother   . Stroke Father   . Atrial fibrillation Father   . Hypertension Father   . Hypertension Paternal Grandfather   . Dementia Maternal Grandmother      Current Outpatient Medications:  .  acyclovir (ZOVIRAX) 400 MG tablet, Take 1 tablet (400 mg total) by mouth daily., Disp: 30 tablet, Rfl: 3 .  albuterol (VENTOLIN HFA) 108 (90 Base) MCG/ACT inhaler, Inhale 2 puffs into the lungs every 6 (six) hours as needed for wheezing or shortness of breath., Disp: 8 g, Rfl: 0 .  amiodarone (PACERONE) 200 MG tablet, Take 1 tablet (200 mg total) by mouth daily. Start after finishing 40 mg twice daily dose of  days (Patient taking differently: Take 200 mg by mouth daily.), Disp: 180 tablet, Rfl: 1 .  calcium carbonate (TUMS - DOSED IN MG ELEMENTAL CALCIUM) 500 MG chewable tablet, Chew 1 tablet by mouth daily., Disp: , Rfl:  .  cholecalciferol (VITAMIN D3) 25 MCG (1000 UNIT) tablet, Take 1,000 Units by mouth daily., Disp: , Rfl:  .  Cyanocobalamin (VITAMIN B-12 PO), Take 1,000 mcg by mouth daily., Disp: , Rfl:  .  dexamethasone (DECADRON) 4 MG tablet, Take 10 tablets (40 mg total) by mouth every 7 (seven) days. Take 40 mg by mouth once a day in the morning of chemo on Wednesdays-  or unless otherwise instructed (Patient taking differently: Take 5 tablets by mouth every 7 (seven) days. Take 5 tabs (20 mg) by mouth once a day in the morning of chemo on Wednesdays- or unless otherwise instructed), Disp: 40 tablet, Rfl: 2 .  ELIQUIS 5 MG  TABS tablet, TAKE ONE TABLET BY MOUTH TWICE A DAY, Disp: 60 tablet, Rfl: 6 .  fluticasone (FLONASE) 50 MCG/ACT nasal spray, Place 1 spray into both nostrils daily as needed for allergies or rhinitis., Disp: , Rfl:  .  gabapentin (NEURONTIN) 300 MG capsule, Take 2 capsules (600 mg total) by mouth 2 (two) times daily., Disp: 360 capsule, Rfl: 1 .  hydrALAZINE (APRESOLINE) 50 MG tablet, Take 2 tablets (100 mg total) by mouth 3 (three) times daily., Disp: , Rfl:  .  ketoconazole (NIZORAL) 2 % cream, as needed for irritation., Disp: , Rfl:  .  metolazone (ZAROXOLYN) 2.5 MG tablet, Take 1 tablet (2.5 mg total) by mouth every other day., Disp: , Rfl:  .  metoprolol succinate (TOPROL-XL) 50 MG 24 hr tablet, Take 1/2 to 1 as needed up to twice a day for AFIB and heart rate above 100., Disp: 90 tablet, Rfl: 3 .  polyethylene glycol powder (GLYCOLAX/MIRALAX) 17 GM/SCOOP powder, Take 1 g by mouth daily., Disp: , Rfl:  .  potassium chloride SA (KLOR-CON M) 20 MEQ tablet, Take 3 tablets (60 mEq total) by mouth 3 (three) times daily. Take an extra 2 tablets on the day you take the Metolazone (80 meq total), Disp: , Rfl:  .  psyllium (METAMUCIL) 58.6 % powder, Take by mouth at bedtime. 3 tsp daily hs, Disp: , Rfl:  .  sodium chloride (OCEAN) 0.65 % SOLN nasal spray, Place 1 spray into both nostrils as needed for congestion., Disp: , Rfl:  .  spironolactone (ALDACTONE) 25 MG tablet, Take 1 tablet (25 mg total) by mouth daily., Disp: , Rfl:  .  TART CHERRY PO, Take 1 tablet by mouth at bedtime., Disp: , Rfl:  .  torsemide (DEMADEX) 20 MG tablet, Take 2 tablets (40 mg total) by mouth daily., Disp: 90 tablet, Rfl: 3 No current facility-administered medications for  this visit.  Facility-Administered Medications Ordered in Other Visits:  .  0.9 %  sodium chloride infusion, , Intravenous, Once, Ulysees Barns IV, MD .  carfilzomib (KYPROLIS) 150 mg in dextrose 5 % 100 mL chemo infusion, 70 mg/m2 (Capped), Intravenous, Once, Jeanie Sewer T IV, MD .  dexamethasone (DECADRON) 20 mg in sodium chloride 0.9 % 50 mL IVPB, 20 mg, Intravenous, Once, Ulysees Barns IV, MD, Last Rate: 208 mL/hr at 02/16/23 1618, 20 mg at 02/16/23 1618  PHYSICAL EXAM: ECOG FS:1 - Symptomatic but completely ambulatory    Vitals:   02/16/23 1600  BP: (!) 120/53  Pulse: (!) 52  Resp: 18  Temp: 98 F (36.7 C)  TempSrc: Oral  SpO2: 100%   Physical Exam Vitals and nursing note reviewed.  Constitutional:      Appearance: He is not ill-appearing or toxic-appearing.  HENT:     Head: Normocephalic.  Eyes:     Conjunctiva/sclera: Conjunctivae normal.  Cardiovascular:     Rate and Rhythm: Bradycardia present.     Pulses: Normal pulses.     Heart sounds: Normal heart sounds.  Pulmonary:     Effort: Pulmonary effort is normal.  Abdominal:     General: There is no distension.  Musculoskeletal:     Cervical back: Normal range of motion.     Right lower leg: No edema.     Left lower leg: No edema.     Comments: Full range of motion of the T-spine and L-spine No tenderness to palpation of the spinous processes of the T-spine or L-spine No crepitus, deformity or  step-offs No tenderness to palpation of the paraspinous muscles of the L-spine    Skin:    General: Skin is warm and dry.  Neurological:     Mental Status: He is alert.     Comments: Sensation grossly intact to light touch in the lower extremities bilaterally. No saddle anesthesias. Strength 5/5 with flexion and extension at the bilateral hips, knees, and ankles. No noted gait deficit. Coordination intact with heel to shin testing.         LABORATORY DATA: I have reviewed the data as listed    Latest Ref  Rng & Units 02/16/2023    2:17 PM 02/09/2023   11:22 AM 01/26/2023   10:20 AM  CBC  WBC 4.0 - 10.5 K/uL 8.5  10.3  9.8   Hemoglobin 13.0 - 17.0 g/dL 29.5  28.4  13.2   Hematocrit 39.0 - 52.0 % 35.4  38.6  36.1   Platelets 150 - 400 K/uL 145  246  161         Latest Ref Rng & Units 02/16/2023    2:17 PM 02/09/2023   11:22 AM 02/03/2023    3:09 PM  CMP  Glucose 70 - 99 mg/dL 440  102  69   BUN 8 - 23 mg/dL 33  33  26   Creatinine 0.61 - 1.24 mg/dL 7.25  3.66  4.40   Sodium 135 - 145 mmol/L 141  141  146   Potassium 3.5 - 5.1 mmol/L 3.2  3.6  4.1   Chloride 98 - 111 mmol/L 102  103  106   CO2 22 - 32 mmol/L 30  30  24    Calcium 8.9 - 10.3 mg/dL 9.2  9.7  8.5   Total Protein 6.5 - 8.1 g/dL 6.3  6.4    Total Bilirubin 0.3 - 1.2 mg/dL 0.5  0.4    Alkaline Phos 38 - 126 U/L 69  75    AST 15 - 41 U/L 13  17    ALT 0 - 44 U/L 13  18         RADIOGRAPHIC STUDIES (from last 24 hours if applicable) I have personally reviewed the radiological images as listed and agreed with the findings in the report. No results found.      Visit Diagnosis: 1. Multiple myeloma not having achieved remission (HCC)   2. Acute left-sided low back pain without sciatica      No orders of the defined types were placed in this encounter.   All questions were answered. The patient knows to call the clinic with any problems, questions or concerns. No barriers to learning was detected.  A total of more than 20 minutes were spent on this encounter with face-to-face time and non-face-to-face time, including preparing to see the patient, counseling the patient and coordination of care as outlined above.    Thank you for allowing me to participate in the care of this patient.    Shanon Ace, PA-C Department of Hematology/Oncology [facility] Phone: [phone]  [phone]    02/16/2023 4:24 PM

## 2023-02-17 NOTE — Telephone Encounter (Signed)
Go up on spironolactone to 50 mg  Cut back on metolazone to every 3rd day    Follow edema To get labs next week right?

## 2023-02-18 ENCOUNTER — Encounter: Payer: Self-pay | Admitting: Hematology and Oncology

## 2023-02-18 ENCOUNTER — Ambulatory Visit: Payer: Medicare Other | Admitting: Physical Therapy

## 2023-02-18 LAB — KAPPA/LAMBDA LIGHT CHAINS
Kappa free light chain: 249 mg/L — ABNORMAL HIGH (ref 3.3–19.4)
Kappa, lambda light chain ratio: 56.59 — ABNORMAL HIGH (ref 0.26–1.65)
Lambda free light chains: 4.4 mg/L — ABNORMAL LOW (ref 5.7–26.3)

## 2023-02-21 ENCOUNTER — Telehealth: Payer: Self-pay | Admitting: *Deleted

## 2023-02-21 LAB — MULTIPLE MYELOMA PANEL, SERUM
Albumin SerPl Elph-Mcnc: 3.5 g/dL (ref 2.9–4.4)
Albumin/Glob SerPl: 1.7 (ref 0.7–1.7)
Alpha 1: 0.3 g/dL (ref 0.0–0.4)
Alpha2 Glob SerPl Elph-Mcnc: 0.8 g/dL (ref 0.4–1.0)
B-Globulin SerPl Elph-Mcnc: 0.9 g/dL (ref 0.7–1.3)
Gamma Glob SerPl Elph-Mcnc: 0.2 g/dL — ABNORMAL LOW (ref 0.4–1.8)
Globulin, Total: 2.1 g/dL — ABNORMAL LOW (ref 2.2–3.9)
IgA: 15 mg/dL — ABNORMAL LOW (ref 61–437)
IgG (Immunoglobin G), Serum: 230 mg/dL — ABNORMAL LOW (ref 603–1613)
IgM (Immunoglobulin M), Srm: 22 mg/dL (ref 15–143)
Total Protein ELP: 5.6 g/dL — ABNORMAL LOW (ref 6.0–8.5)

## 2023-02-21 NOTE — Telephone Encounter (Signed)
TCT patient regarding his increased Kapp/lambda ratios.  Spoke with pt. Advised that per Dr. Leonides Schanz, we will be reaching out to [facility] for 2nd opinion regarding next line of treatment. Pt is leaving today for 2 weeks in [place] and [place]. Advised to be mindful of any new bone pain.  Pt asked if he should take any Revlimid or Pomalyst while he is out of town. Advised that he should NOT take these as they have not been effective  up to now, so there is no benefit to taking them. Pt voiced understanding.

## 2023-03-04 ENCOUNTER — Ambulatory Visit: Payer: Medicare Other | Admitting: Physician Assistant

## 2023-03-09 ENCOUNTER — Inpatient Hospital Stay: Payer: Medicare Other | Attending: Hematology and Oncology | Admitting: Hematology and Oncology

## 2023-03-09 ENCOUNTER — Inpatient Hospital Stay: Payer: Medicare Other | Attending: Hematology and Oncology

## 2023-03-09 ENCOUNTER — Other Ambulatory Visit: Payer: Self-pay | Admitting: *Deleted

## 2023-03-09 ENCOUNTER — Inpatient Hospital Stay: Payer: Medicare Other

## 2023-03-09 VITALS — BP 167/73 | HR 64 | Temp 98.0°F | Resp 13 | Wt 265.0 lb

## 2023-03-09 DIAGNOSIS — G4733 Obstructive sleep apnea (adult) (pediatric): Secondary | ICD-10-CM | POA: Diagnosis not present

## 2023-03-09 DIAGNOSIS — Z7901 Long term (current) use of anticoagulants: Secondary | ICD-10-CM | POA: Insufficient documentation

## 2023-03-09 DIAGNOSIS — C9000 Multiple myeloma not having achieved remission: Secondary | ICD-10-CM

## 2023-03-09 DIAGNOSIS — Z9089 Acquired absence of other organs: Secondary | ICD-10-CM | POA: Insufficient documentation

## 2023-03-09 DIAGNOSIS — M5126 Other intervertebral disc displacement, lumbar region: Secondary | ICD-10-CM | POA: Insufficient documentation

## 2023-03-09 DIAGNOSIS — Z818 Family history of other mental and behavioral disorders: Secondary | ICD-10-CM | POA: Diagnosis not present

## 2023-03-09 DIAGNOSIS — M48061 Spinal stenosis, lumbar region without neurogenic claudication: Secondary | ICD-10-CM | POA: Insufficient documentation

## 2023-03-09 DIAGNOSIS — Z5112 Encounter for antineoplastic immunotherapy: Secondary | ICD-10-CM | POA: Diagnosis not present

## 2023-03-09 DIAGNOSIS — I10 Essential (primary) hypertension: Secondary | ICD-10-CM | POA: Insufficient documentation

## 2023-03-09 DIAGNOSIS — Z881 Allergy status to other antibiotic agents status: Secondary | ICD-10-CM | POA: Insufficient documentation

## 2023-03-09 DIAGNOSIS — E876 Hypokalemia: Secondary | ICD-10-CM | POA: Diagnosis not present

## 2023-03-09 DIAGNOSIS — Z7961 Long term (current) use of immunomodulator: Secondary | ICD-10-CM | POA: Insufficient documentation

## 2023-03-09 DIAGNOSIS — R1310 Dysphagia, unspecified: Secondary | ICD-10-CM | POA: Insufficient documentation

## 2023-03-09 DIAGNOSIS — I480 Paroxysmal atrial fibrillation: Secondary | ICD-10-CM | POA: Diagnosis not present

## 2023-03-09 DIAGNOSIS — E669 Obesity, unspecified: Secondary | ICD-10-CM | POA: Diagnosis not present

## 2023-03-09 DIAGNOSIS — Z7952 Long term (current) use of systemic steroids: Secondary | ICD-10-CM | POA: Diagnosis not present

## 2023-03-09 DIAGNOSIS — Z923 Personal history of irradiation: Secondary | ICD-10-CM | POA: Insufficient documentation

## 2023-03-09 DIAGNOSIS — Z86718 Personal history of other venous thrombosis and embolism: Secondary | ICD-10-CM | POA: Insufficient documentation

## 2023-03-09 DIAGNOSIS — Z823 Family history of stroke: Secondary | ICD-10-CM | POA: Insufficient documentation

## 2023-03-09 DIAGNOSIS — Z8249 Family history of ischemic heart disease and other diseases of the circulatory system: Secondary | ICD-10-CM | POA: Insufficient documentation

## 2023-03-09 DIAGNOSIS — M533 Sacrococcygeal disorders, not elsewhere classified: Secondary | ICD-10-CM | POA: Diagnosis not present

## 2023-03-09 DIAGNOSIS — Z79899 Other long term (current) drug therapy: Secondary | ICD-10-CM | POA: Insufficient documentation

## 2023-03-09 LAB — CMP (CANCER CENTER ONLY)
ALT: 23 U/L (ref 0–44)
AST: 19 U/L (ref 15–41)
Albumin: 4 g/dL (ref 3.5–5.0)
Alkaline Phosphatase: 58 U/L (ref 38–126)
Anion gap: 6 (ref 5–15)
BUN: 18 mg/dL (ref 8–23)
CO2: 26 mmol/L (ref 22–32)
Calcium: 9.2 mg/dL (ref 8.9–10.3)
Chloride: 109 mmol/L (ref 98–111)
Creatinine: 0.91 mg/dL (ref 0.61–1.24)
GFR, Estimated: >60 mL/min (ref >60)
Glucose, Bld: 114 mg/dL — ABNORMAL HIGH (ref 70–99)
Potassium: 3.9 mmol/L (ref 3.5–5.1)
Sodium: 141 mmol/L (ref 135–145)
Total Bilirubin: 0.5 mg/dL (ref <1.2)
Total Protein: 5.9 g/dL — ABNORMAL LOW (ref 6.5–8.1)

## 2023-03-09 LAB — CBC WITH DIFFERENTIAL (CANCER CENTER ONLY)
Abs Immature Granulocytes: 0.02 10*3/uL (ref 0.00–0.07)
Basophils Absolute: 0 10*3/uL (ref 0.0–0.1)
Basophils Relative: 1 %
Eosinophils Absolute: 0.1 10*3/uL (ref 0.0–0.5)
Eosinophils Relative: 2 %
HCT: 34.8 % — ABNORMAL LOW (ref 39.0–52.0)
Hemoglobin: 11.8 g/dL — ABNORMAL LOW (ref 13.0–17.0)
Immature Granulocytes: 0 %
Lymphocytes Relative: 4 %
Lymphs Abs: 0.4 10*3/uL — ABNORMAL LOW (ref 0.7–4.0)
MCH: 32.8 pg (ref 26.0–34.0)
MCHC: 33.9 g/dL (ref 30.0–36.0)
MCV: 96.7 fL (ref 80.0–100.0)
Monocytes Absolute: 0.5 10*3/uL (ref 0.1–1.0)
Monocytes Relative: 7 %
Neutro Abs: 7.1 10*3/uL (ref 1.7–7.7)
Neutrophils Relative %: 86 %
Platelet Count: 196 10*3/uL (ref 150–400)
RBC: 3.6 MIL/uL — ABNORMAL LOW (ref 4.22–5.81)
RDW: 17 % — ABNORMAL HIGH (ref 11.5–15.5)
WBC Count: 8.1 10*3/uL (ref 4.0–10.5)
nRBC: 0 % (ref 0.0–0.2)

## 2023-03-09 MED ORDER — SODIUM CHLORIDE 0.9 % IV SOLN: Freq: Once | INTRAVENOUS | Status: AC

## 2023-03-09 MED ORDER — DEXTROSE 5 % IV SOLN
70.0000 mg/m2 | Freq: Once | INTRAVENOUS | Status: AC
  Administered 2023-03-09: 150 mg via INTRAVENOUS
  Filled 2023-03-09: qty 60

## 2023-03-09 MED ORDER — DEXAMETHASONE 4 MG PO TABS: 20.0000 mg | ORAL_TABLET | ORAL | 3 refills | Status: DC

## 2023-03-09 NOTE — Patient Instructions (Signed)
[facility] - A DEPT OF [facility]  Discharge Instructions: Thank you for choosing [facility] to provide your oncology and hematology care.   If you have a lab appointment with the Cancer Center, please go directly to the Cancer Center and check in at the registration area.   Wear comfortable clothing and clothing appropriate for easy access to any Portacath or PICC line.   We strive to give you quality time with your provider. You may need to reschedule your appointment if you arrive late (15 or more minutes).  Arriving late affects you and other patients whose appointments are after yours.  Also, if you miss three or more appointments without notifying the office, you may be dismissed from the clinic at the provider's discretion.      For prescription refill requests, have your pharmacy contact our office and allow 72 hours for refills to be completed.    Today you received the following chemotherapy and/or immunotherapy agents: Kyprolis      To help prevent nausea and vomiting after your treatment, we encourage you to take your nausea medication as directed.  BELOW ARE SYMPTOMS THAT SHOULD BE REPORTED IMMEDIATELY: *FEVER GREATER THAN 100.4 F (38 C) OR HIGHER *CHILLS OR SWEATING *NAUSEA AND VOMITING THAT IS NOT CONTROLLED WITH YOUR NAUSEA MEDICATION *UNUSUAL SHORTNESS OF BREATH *UNUSUAL BRUISING OR BLEEDING *URINARY PROBLEMS (pain or burning when urinating, or frequent urination) *BOWEL PROBLEMS (unusual diarrhea, constipation, pain near the anus) TENDERNESS IN MOUTH AND THROAT WITH OR WITHOUT PRESENCE OF ULCERS (sore throat, sores in mouth, or a toothache) UNUSUAL RASH, SWELLING OR PAIN  UNUSUAL VAGINAL DISCHARGE OR ITCHING   Items with * indicate a potential emergency and should be followed up as soon as possible or go to the Emergency Department if any problems should occur.  Please show the CHEMOTHERAPY ALERT CARD or IMMUNOTHERAPY  ALERT CARD at check-in to the Emergency Department and triage nurse.  Should you have questions after your visit or need to cancel or reschedule your appointment, please contact [facility] - A DEPT OF [facility]  Dept: [phone]  and follow the prompts.  Office hours are 8:00 a.m. to 4:30 p.m. Monday - Friday. Please note that voicemails left after 4:00 p.m. may not be returned until the following business day.  We are closed weekends and major holidays. You have access to a nurse at all times for urgent questions. Please call the main number to the clinic Dept: [phone] and follow the prompts.   For any non-urgent questions, you may also contact your provider using MyChart. We now offer e-Visits for anyone 38 and older to request care online for non-urgent symptoms. For details visit [email or web address].   Also download the MyChart app! Go to the app store, search "MyChart", open the app, select [place], and log in with your MyChart username and password.

## 2023-03-09 NOTE — Progress Notes (Signed)
[facility] [phone]   [phone]  PROGRESS NOTE  Patient Care Team: [name], MD as PCP - General (Internal Medicine) Pricilla Riffle, MD as PCP - Cardiology (Cardiology) Quintella Reichert, MD as PCP - Sleep Medicine (Cardiology) Lanier Prude, MD as PCP - Electrophysiology (Cardiology) [name], MD as Consulting Physician (General Surgery) Vida Rigger, MD as Consulting Physician (Gastroenterology) Quintella Reichert, MD as Consulting Physician (Sleep Medicine) Glendale Chard, DO as Consulting Physician (Neurology)  Hematological/Oncological History # IgA Kappa Multiple Myeloma 05/05/2020: Bone marrow biopsy showed increased number of plasma cells representing 14% of all cells in the aspirate associated with numerous variably sized clusters in the clot and biopsy sections 04/10/2021: M protein 0.3, IgA kappa specificity. Kappa 580.5, Labda 7.0, ratio 82.93.   12/07/2021: Labs show of 1971.3, lambda 5.5, ratio 358.42 12/10/2021: Transition care to Dr. Leonides Schanz. 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  01/26/2022: Cycle 2 Day 1 of Dara/Rev/Dex 02/22/2022: Cycle 3 Day 1 of Dara/Rev/Dex 03/23/2022: Cycle 4 Day 1 of Dara/Rev/Dex 04/20/2022: Cycle 5 Day 1 of Dara/Rev/Dex 05/18/2022: Cycle 6 Day 1 of Dara/Rev/Dex 05/27/2022: MRI cervical/thoracic/lumbar: pathologic fracture at T2 with significant osseous retropulsion and resulting cord compression. Additional smaller lesions within T1 vertebral body, right C7 and T3 articulating facets.  Small Multiple Myeloma lesions scattered at all lumbar levels and in the visible left sacral ala. Largest lumbar tumor is 2.3 cm in the left L1 posterior elements and pedicle. No lumbar extraosseous or epidural tumor extension, or pathologic fracture.Underlying chronic lumbar spine  degeneration, with a new central disc herniation at L3-L4 since 2021 now resulting in mild to moderate degenerative spinal stenosis there. 05/28/2022-06/09/2022: Admitted for T2 pathologic fracture and cord compression. He received urgent radiation therapy for a total of 3.0 Gy. He underwent  thoracic 1 through 4 posterior lateral arthrodesis/fusion and 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  06/23/2022: Cycle 1 Day 1 of Velcade/Pomalyst/Dex 08/04/2022: Cycle 2 Day 1 of Velcade/Pomalyst/Dex. Pomalyst to be decreased to 2mg  PO daily due to cytopenias.  09/01/2022: Cycle 3 Day 1 of Velcade/Pomalyst/Dex. (Missed 2nd dose of cycle due to hospitalization)  10/06/2022: Cycle 4 Day 1 of Velcade/Pomalyst/Dex.  10/27/2022: Cycle 5 Day 1 of Velcade/Pomalyst/Dex.  12/02/2022: Cycle 6 Day 1 of Velcade/Pomalyst/Dex. 01/12/2023: Cycle 1 Day 1 of Kyprolis/Dex 02/09/2023: Cycle 2 Day 1 of Kyprolis/Dex    Interval History:  [name] 72 y.o. male with medical history significant for IgA Kappa Multiple Myeloma who presents for a follow up visit. He was last seen on 02/09/2023. In the interim, he continued on Kyprolis/Dex therapy.  On exam today Mr. Enriquez reports he had a wonderful trip to [place].  He reports that he was in [place], [place], [place], and [place].  He reports it was an excellent trip.  He is exhausted.  He reports he has been retaining fluid though he was eating well and a lot of salty foods on the trip.  His energy levels were overall good and he was able to keep up with his activities.  He notes that the swelling is predominantly in his belly and face and not so much in his lungs or lower extremities.  Overall he is tolerating his Kyprolis therapy well without any major side effects or symptoms.  He denies fevers, chills, sweats, shortness of breath, chest pain or cough. He  has no other complaints. Rest of the 10 point ROS is below.    The bulk of our discussion focused on the  results of his kappa light chains.  They do appear to be increasing on Kyprolis therapy.  Given this progression we may need to consider alternative lines of therapy.  By specific therapy would be a consideration for which I would recommend we refer him to one of the major academic medical centers.  The patient already has a relationship with [facility] which might be the best way forward.  MEDICAL HISTORY:  Past Medical History:  Diagnosis Date   A-fib [facility])    Arthritis    "comes and goes; mostly in hands, occasionally elbow" (04/05/2017)   Complication of anesthesia    "just wanted to sleep alot  for couple days S/P VARICOCELE OR" (04/05/2017)   DVT (deep venous thrombosis) (HCC)    LLE   Dysrhythmia    PVCs    GERD (gastroesophageal reflux disease)    History of hiatal hernia    History of radiation therapy    Thoracic Spine- 07/13/22-07/23/22-Dr. Antony Blackbird   Hypertension    Impaired glucose tolerance    Multiple myeloma (HCC)    Obesity (BMI 30-39.9) 07/26/2016   OSA on CPAP 07/26/2016   Mild with AHI 12.5/hr now on CPAP at 14cm H2O   PAF (paroxysmal atrial fibrillation) (HCC)    s/p PVI Ablation in 2018 // Flecainide Rx // Echo 8/21: EF 60-65, no RWMA, mild LVH, normal RV SF, moderate RVE, mild BAE, trivial MR, mild dilation of aortic root (40 mm)   Pneumonia    "may have had walking pneumonia a few years ago" (04/05/2017)   Pre-diabetes    Thrombophlebitis     SURGICAL HISTORY: Past Surgical History:  Procedure Laterality Date   ATRIAL FIBRILLATION ABLATION  04/05/2017   ATRIAL FIBRILLATION ABLATION N/A 04/05/2017   Procedure: ATRIAL FIBRILLATION ABLATION;  Surgeon: [name], MD;  Location: [facility];  Service: Cardiovascular;  Laterality: N/A;   BONE BIOPSY Right 12/17/2021   Procedure: BONE BIOPSY;  Surgeon: [name], MD;  Location: [facility];  Service: Orthopedics;  Laterality: Right;   COMPLETE RIGHT HIP REPLACMENT   01/19/2019   EVALUATION UNDER ANESTHESIA WITH HEMORRHOIDECTOMY N/A 02/24/2017   Procedure: EXAM UNDER ANESTHESIA WITH  HEMORRHOIDECTOMY;  Surgeon: [name], MD;  Location: [facility];  Service: General;  Laterality: N/A;   EXCISIONAL TOTAL HIP ARTHROPLASTY WITH ANTIBIOTIC SPACERS Right 09/25/2020   Procedure: EXCISIONAL TOTAL HIP ARTHROPLASTY WITH ANTIBIOTIC SPACERS;  Surgeon: [name], MD;  Location: [facility];  Service: Orthopedics;  Laterality: Right;   FLEXIBLE SIGMOIDOSCOPY N/A 04/15/2015   Procedure:  UNSEDATED FLEXIBLE SIGMOIDOSCOPY;  Surgeon: [name], MD;  Location: [facility];  Service: Endoscopy;  Laterality: N/A;   HUMERUS IM NAIL Right 12/17/2021   Procedure: INTRAMEDULLARY (IM) NAIL HUMERAL;  Surgeon: [name], MD;  Location:  SURGERY CENTER;  Service: Orthopedics;  Laterality: Right;   KNEE ARTHROSCOPY Left ~ 2016   POSTERIOR CERVICAL FUSION/FORAMINOTOMY N/A 06/04/2022   Procedure: Thoracic one - Thoracic Four Posterior lateral arthrodesis/ fusion and Thoracic two Corpectomy;  Surgeon: [name], MD;  Location: [facility] OR;  Service: Neurosurgery;  Laterality: N/A;   REIMPLANTATION OF TOTAL HIP Right 12/25/2020   Procedure: REIMPLANTATION/REVISION OF RIGHT TOTAL HIP VERSUS REPEAT IRRIGATION AND DEBRIDEMENT;  Surgeon: [name], MD;  Location: [facility];  Service: Orthopedics;  Laterality: Right;  TESTICLE SURGERY  1988   VARICOCELE   TONSILLECTOMY     VARICOSE VEIN SURGERY Bilateral     SOCIAL HISTORY: Social History   Socioeconomic History   Marital status: Married    Spouse name: Not on file   Number of children: 1   Years of education: law school   Highest education level: Not on file  Occupational History   Occupation: lawyer  Tobacco Use   Smoking status: Never   Smokeless tobacco: Never  Vaping Use   Vaping status: Never Used  Substance and Sexual Activity   Alcohol use: Yes    Alcohol/week: 2.0 standard drinks of alcohol    Types: 2  Glasses of wine per week    Comment: 2 glasses of wine a week, occ bourbon, beer   Drug use: No   Sexual activity: Not Currently  Other Topics Concern   Not on file  Social History Narrative   Lives in [place] with spouse in a 2 story home.  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 1 daughter.     Education: Social worker school.    Social Determinants of Health   Financial Resource Strain: Not on file  Food Insecurity: No Food Insecurity (09/27/2022)   Hunger Vital Sign    Worried About Running Out of Food in the Last Year: Never true    Ran Out of Food in the Last Year: Never true  Transportation Needs: No Transportation Needs (09/27/2022)   PRAPARE - Administrator, Civil Service (Medical): No    Lack of Transportation (Non-Medical): No  Physical Activity: Not on file  Stress: Not on file  Social Connections: Unknown (09/15/2021)   [facility]   Social Network    Social Network: Not on file  Intimate Partner Violence: Not At Risk (09/27/2022)   Humiliation, Afraid, Rape, and Kick questionnaire    Fear of Current or Ex-Partner: No    Emotionally Abused: No    Physically Abused: No    Sexually Abused: No    FAMILY HISTORY: Family History  Problem Relation Age of Onset   Dementia Mother    Stroke Father    Atrial fibrillation Father    Hypertension Father    Hypertension Paternal Grandfather    Dementia Maternal Grandmother     ALLERGIES:  is allergic to linezolid and vancomycin.  MEDICATIONS:  Current Outpatient Medications  Medication Sig Dispense Refill   acyclovir (ZOVIRAX) 400 MG tablet Take 1 tablet (400 mg total) by mouth daily. 30 tablet 3   albuterol (VENTOLIN HFA) 108 (90 Base) MCG/ACT inhaler Inhale 2 puffs into the lungs every 6 (six) hours as needed for wheezing or shortness of breath. 8 g 0   amiodarone (PACERONE) 200 MG tablet Take 1 tablet (200 mg total) by mouth daily. Start after finishing 40 mg  twice daily dose of  days (Patient taking differently: Take 200 mg by mouth daily.) 180 tablet 1   calcium carbonate (TUMS - DOSED IN MG ELEMENTAL CALCIUM) 500 MG chewable tablet Chew 1 tablet by mouth daily.     cholecalciferol (VITAMIN D3) 25 MCG (1000 UNIT) tablet Take 1,000 Units by mouth daily.     Cyanocobalamin (VITAMIN B-12 PO) Take 1,000 mcg by mouth daily.     dexamethasone (DECADRON) 4 MG tablet Take 5 tablets (20 mg total) by mouth every 7 (seven) days. Take 5 tabs (20 mg) by mouth once a day in the  morning of chemo on Wednesdays- or unless otherwise instructed 20 tablet 3   ELIQUIS 5 MG TABS tablet TAKE ONE TABLET BY MOUTH TWICE A DAY 60 tablet 6   fluticasone (FLONASE) 50 MCG/ACT nasal spray Place 1 spray into both nostrils daily as needed for allergies or rhinitis.     gabapentin (NEURONTIN) 300 MG capsule Take 2 capsules (600 mg total) by mouth 2 (two) times daily. 360 capsule 1   hydrALAZINE (APRESOLINE) 50 MG tablet Take 2 tablets (100 mg total) by mouth 3 (three) times daily.     ketoconazole (NIZORAL) 2 % cream as needed for irritation.     metolazone (ZAROXOLYN) 2.5 MG tablet Take 1 tablet (2.5 mg total) by mouth every other day.     metoprolol succinate (TOPROL-XL) 50 MG 24 hr tablet Take 1/2 to 1 as needed up to twice a day for AFIB and heart rate above 100. 90 tablet 3   polyethylene glycol powder (GLYCOLAX/MIRALAX) 17 GM/SCOOP powder Take 1 g by mouth daily.     potassium chloride SA (KLOR-CON M) 20 MEQ tablet Take 3 tablets (60 mEq total) by mouth 3 (three) times daily. Take an extra 2 tablets on the day you take the Metolazone (80 meq total)     psyllium (METAMUCIL) 58.6 % powder Take by mouth at bedtime. 3 tsp daily hs     sodium chloride (OCEAN) 0.65 % SOLN nasal spray Place 1 spray into both nostrils as needed for congestion.     spironolactone (ALDACTONE) 25 MG tablet Take 1 tablet (25 mg total) by mouth daily.     TART CHERRY PO Take 1 tablet by mouth at bedtime.      torsemide (DEMADEX) 20 MG tablet Take 2 tablets (40 mg total) by mouth daily. 90 tablet 3   No current facility-administered medications for this visit.    REVIEW OF SYSTEMS:   Constitutional: ( - ) fevers, ( - )  chills , ( - ) night sweats Eyes: ( - ) blurriness of vision, ( - ) double vision, ( - ) watery eyes Ears, nose, mouth, throat, and face: ( - ) mucositis, ( - ) sore throat Respiratory: ( - ) cough, ( - ) dyspnea, ( - ) wheezes Cardiovascular: ( - ) palpitation, ( - ) chest discomfort, ( - ) lower extremity swelling Gastrointestinal:  ( - ) nausea, ( - ) heartburn, ( - ) change in bowel habits Skin: ( - ) abnormal skin rashes Lymphatics: ( - ) new lymphadenopathy, ( - ) easy bruising Neurological: ( - ) numbness, ( - ) tingling, ( - ) new weaknesses Behavioral/Psych: ( - ) mood change, ( - ) new changes  All other systems were reviewed with the patient and are negative.  PHYSICAL EXAMINATION: ECOG PERFORMANCE STATUS: 1 - Symptomatic but completely ambulatory  Vitals:   03/09/23 1138  BP: (!) 167/73  Pulse: 64  Resp: 13  Temp: 98 F (36.7 C)  SpO2: 97%       Filed Weights   03/09/23 1138  Weight: 265 lb (120.2 kg)    GENERAL: Well-appearing elderly Caucasian male, alert, no distress and comfortable SKIN: no overt abnormalities of the skin.  EYES: conjunctiva are pink and non-injected, sclera clear LUNGS: clear to auscultation and percussion with normal breathing effort HEART: tachycardic &  no murmurs. +bilateral trace lower extremity edema Musculoskeletal: no cyanosis of digits and no clubbing  PSYCH: alert & oriented x 3, fluent speech NEURO: no focal motor/sensory  deficits  LABORATORY DATA:  I have reviewed the data as listed    Latest Ref Rng & Units 03/09/2023   11:13 AM 02/16/2023    2:17 PM 02/09/2023   11:22 AM  CBC  WBC 4.0 - 10.5 K/uL 8.1  8.5  10.3   Hemoglobin 13.0 - 17.0 g/dL 16.1  09.6  04.5   Hematocrit 39.0 - 52.0 % 34.8  35.4  38.6    Platelets 150 - 400 K/uL 196  145  246        Latest Ref Rng & Units 03/09/2023   11:13 AM 02/16/2023    2:17 PM 02/09/2023   11:22 AM  CMP  Glucose 70 - 99 mg/dL 409  811  914   BUN 8 - 23 mg/dL 18  33  33   Creatinine 0.61 - 1.24 mg/dL 7.82  9.56  2.13   Sodium 135 - 145 mmol/L 141  141  141   Potassium 3.5 - 5.1 mmol/L 3.9  3.2  3.6   Chloride 98 - 111 mmol/L 109  102  103   CO2 22 - 32 mmol/L 26  30  30    Calcium 8.9 - 10.3 mg/dL 9.2  9.2  9.7   Total Protein 6.5 - 8.1 g/dL 5.9  6.3  6.4   Total Bilirubin <1.2 mg/dL 0.5  0.5  0.4   Alkaline Phos 38 - 126 U/L 58  69  75   AST 15 - 41 U/L 19  13  17    ALT 0 - 44 U/L 23  13  18      Lab Results  Component Value Date   MPROTEIN Not Observed 02/16/2023   MPROTEIN Not Observed 12/29/2022   MPROTEIN Not Observed 12/02/2022   Lab Results  Component Value Date   KPAFRELGTCHN 249.0 (H) 02/16/2023   KPAFRELGTCHN 97.8 (H) 12/29/2022   KPAFRELGTCHN 94.2 (H) 12/02/2022   LAMBDASER 4.4 (L) 02/16/2023   LAMBDASER 15.9 12/29/2022   LAMBDASER 20.3 12/02/2022   KAPLAMBRATIO 56.59 (H) 02/16/2023   KAPLAMBRATIO 6.15 (H) 12/29/2022   KAPLAMBRATIO 4.64 (H) 12/02/2022     RADIOGRAPHIC STUDIES: No results found.  ASSESSMENT & PLAN BERWYN BIGLEY is a 72 y.o. male with medical history significant for IgA Kappa Multiple Myeloma who presents for a follow up visit.   # IgA Kappa Multiple Myeloma --Metastatic survey completed 12/15/2021 --Patient underwent fixation of his humerus on 12/17/2021, pathology confirmed plasmacytoma/plasma cell neoplasm.  --Received 6 cycles of Darzalex, Revlimid, and dexamethasone started on 12/28/2021-05/18/2022.  --Due to worsening myeloma osseous lesions including pathologic fracture at T2 with cord compression, Dr. Leonides Schanz recommends changing therapy to Velcade/Pomalyst/Dex.  --Due to rising kappa free light chains, recommend to change therapy to Carfilzomib/Dex started on 01/12/2023. Plan: --Due for Cycle  3, Day 1 of Carfilzomib/Dex today --labs from today showed white blood cell 8.1, Hgb 11.8, MCV 96.7, Plt 196. Creatinine and calcium level normal.  --kappa 249, Lambda 4.4, ratio 56.59. Appears that kappa chains are increasing. Discussed options moving forward including referral back to [facility].  --Proceed with treatment today without any dose modifications.  --RTC in 2 weeks with interval weekly treatment     #Neck pain and mid back pain: --Suspect pain is exacerbated from recent hospitalization and being in bed/sitting for long periods.  --MRI C/T/L spine obtinaed that showed unchanged lytic lesions in the right C7 inferior articular process,T6, T8, and T10  vertebral bodies, as well  as the T11 spinous process. Unchanged  multilevel cervical spondylsis, Interval enlargement of disc herniation at L3-L4 with progressive spinal canal stenosis. No new or progressive lytic lesions.  --Encouraged to continue with physical therapy and follow up with ortho.   #Palliative Radiation Therapy: # Dysphagia --Received palliative radiation from 01/06/2022-01/19/2022 to right hymerus, right forearm and right chest/rib, 20 Gy in 10 Fx.  --recieved palliative radiation to surgical bed in the upper thoracic spine area on 07/13/2022.  27 Gy in 9 Fx.   #Lower extremity edema--improved: --Currently on lasix and torsemide  # Rash Concerning for Shingles-resolved -- Prescribed valacyclovir 3 times daily 1000 mg x 10 days-completed the course -- We will continue to monitor rash closely.  #DVT: --Found to have acute DVT involving left peroneal veins. Likely secondary to recent surgery and hospitalization --Currently on eliquis 5 mg BID.    #Hypokalemia: --Potassium level is 3.6 today. Advised to continue with 40 mEq twice daily and increase to 60 mEq on the days he takes torsemide/spironolactone  #Rash on scalp and right forearm: --Recommend topical hydrocortisone cream for spot  treatment  #Supportive Care -- chemotherapy education complete -- port placement not required -- zofran 8mg  q8H PRN and compazine 10mg  PO q6H for nausea -- acyclovir 400mg  PO BID for VCZ prophylaxis -- Started Zometa q 12 weeks on 11/10/2022. Next dose due today.   No orders of the defined types were placed in this encounter.  All questions were answered. The patient knows to call the clinic with any problems, questions or concerns.  A total of more than 30 minutes were spent on this encounter with face-to-face time and non-face-to-face time, including preparing to see the patient, ordering tests and/or medications, counseling the patient and coordination of care as outlined above.   Ulysees Barns, MD Department of Hematology/Oncology [facility] at [facility] Phone: [phone] Pager: [phone] Email: [email or web address]  03/09/2023 4:21 PM

## 2023-03-09 NOTE — Progress Notes (Signed)
Patient reports he took dexamethasone 60 mg po at home at 0900

## 2023-03-10 DIAGNOSIS — H2512 Age-related nuclear cataract, left eye: Secondary | ICD-10-CM | POA: Diagnosis not present

## 2023-03-10 DIAGNOSIS — H25812 Combined forms of age-related cataract, left eye: Secondary | ICD-10-CM | POA: Diagnosis not present

## 2023-03-10 LAB — KAPPA/LAMBDA LIGHT CHAINS
Kappa free light chain: 270.6 mg/L — ABNORMAL HIGH (ref 3.3–19.4)
Kappa, lambda light chain ratio: 82 — ABNORMAL HIGH (ref 0.26–1.65)
Lambda free light chains: 3.3 mg/L — ABNORMAL LOW (ref 5.7–26.3)

## 2023-03-11 ENCOUNTER — Ambulatory Visit: Payer: Medicare Other | Attending: Physician Assistant | Admitting: Physician Assistant

## 2023-03-11 ENCOUNTER — Encounter: Payer: Self-pay | Admitting: Physician Assistant

## 2023-03-11 VITALS — BP 136/66 | HR 64 | Ht 70.0 in | Wt 264.2 lb

## 2023-03-11 DIAGNOSIS — I480 Paroxysmal atrial fibrillation: Secondary | ICD-10-CM | POA: Diagnosis not present

## 2023-03-11 DIAGNOSIS — E876 Hypokalemia: Secondary | ICD-10-CM

## 2023-03-11 DIAGNOSIS — R001 Bradycardia, unspecified: Secondary | ICD-10-CM | POA: Diagnosis not present

## 2023-03-11 DIAGNOSIS — R600 Localized edema: Secondary | ICD-10-CM

## 2023-03-11 DIAGNOSIS — D472 Monoclonal gammopathy: Secondary | ICD-10-CM | POA: Diagnosis not present

## 2023-03-11 DIAGNOSIS — I5030 Unspecified diastolic (congestive) heart failure: Secondary | ICD-10-CM

## 2023-03-11 DIAGNOSIS — I10 Essential (primary) hypertension: Secondary | ICD-10-CM

## 2023-03-11 DIAGNOSIS — Z79899 Other long term (current) drug therapy: Secondary | ICD-10-CM

## 2023-03-11 DIAGNOSIS — G4733 Obstructive sleep apnea (adult) (pediatric): Secondary | ICD-10-CM

## 2023-03-11 NOTE — Progress Notes (Addendum)
Cardiology Office Note:  .   Date:  03/11/2023  ID:  Travis [name], DOB Oct 26, 1950, MRN [id number] PCP: [name], MD  [facility] Providers Cardiologist:  [name], MD Electrophysiologist:  Lanier Prude, MD  Sleep Medicine:  Armanda Magic, MD {  History of Present Illness: .   Travis [name] is a 72 y.o. male with a past medical history of PAF (on Eliquis) status post PVI ablation 2018, hypertension, OSA, prior DVT, monoclonal gammopathy (smoldering myeloma), peripheral neuropathy who presents today for follow-up appointment.   Was last seen by [name], NP 10/20/2022.  Had been followed by Dr. [name] for many years for management of hypertension and atrial fibrillation.  Completed a Myoview 10/17 that was normal with no evidence of ischemia.  Diagnosed with atrial fibrillation in 2017 and underwent PVI ablation by Dr. [name] 2018.  Also completed cardiac CTA at that time showing no evidence of calcium.  Had an echocardiogram 2019 that showed LVEF 65% with no RWMA, normal diastolic function with aortic root measuring 40 mm.  Treated with Darzalex, Revlimid, and dexamethasone 12/28/2021 for multiple myeloma.  Seen in the AF clinic on 01/2022 for management of A-fib and plan for outpatient cardioversion however patient spontaneously converted.  Developed AF with RVR 03/2022 while in [place] and required diuresis.  He was seen 04/2022 for follow-up and was back in atrial flutter with no resolution with metoprolol.  He was started on amiodarone and metoprolol was cut back.  Referred to Dr. Lalla Brothers for discussion of treatment options 05/12/2022.  Amiodarone was reduced to 200 mg daily and blood pressure was noted to be above goal.  Hydralazine was increased to 37.53 times a day.  Seen in the ED 06/2022 with AF RVR after suffering a fall.  2D echo was completed with LVEF 60 to 65% with grade 2 DD.  Patient in normal sinus rhythm at that time.  Worsening dyspnea after being  discharged and was brought back to the ER via EMS.  CT scan was completed showing no evidence of PE but did reveal bilateral pneumonia with small bilateral pleural effusion.  Found to be in AF with RVR on arrival with acute CHF exacerbation.  Was loaded with amnio and diuresed.  Was seen in AF clinic 07/15/2022 with plan to pursue DCCV if sinus rhythm was not maintained with amiodarone.  He was seen by Dr. [name] 07/29/2022 and was maintaining sinus rhythm therefore DCCV was canceled.  He was seen by Dr. Ross/9/24 for evaluation of lower extremity edema.  He reported poor response to Lasix which was increased to 80 mg daily.  Was maintained on Eliquis and amiodarone.  Had addition of metolazone 2.5 mg to help with diuresis.  Contacted office shortly after complaining of elevated systolic blood pressure.  Hydralazine was increased to 50/25/50 mg daily and then switch to 75 mg 3 times daily.  He presented for follow-up with [name], NP few weeks ago.  Was seen for elevated blood pressures.  Seen in the office and patient reported experiencing elevated blood pressures with systolic readings in the 160s to 170s.  Some shortness of breath and slightly volume up during that exam.  Compliant with current medications no adverse reactions.  Denied any new supplements or sodium indiscretions in his diet.  He does note some discomfort since recovering from thoracic spine surgery.  Blood pressure was 174/82 and recheck of blood pressure was not completed until after patient's exam.  Denied any  headaches or vision changes.  Denied chest pain, palpitations, dyspnea, PND, orthopnea, nausea, vomiting, dizziness, syncope, weight gain, or early satiety.  He saw me 11/05/22, he tells me that he was not in a week and then it broke.  He has sinus bradycardia when he is out of A-fib before but typically does not last long.  Heart rate is 45 bpm today.  Patient states that he could barely get out of his chair (he went for a pedicure  this morning).  He is extremely weak and tired.  Also states that he feels fluid overloaded today.  He was taking metoprolol 50 mg daily a few days in a row since he was in A-fib and then when he converted his heart rate went down to the 40s and did not recover.  He also is having issues with his back and has a herniated disc that is affecting his arms and legs.  He has follow-up for this scheduled.  Unfortunately, the patient and his wife were headed out of the country tomorrow on a cruise around [place] and we have advised that they do not travel in his current condition.  We discussed his most recent echocardiogram.  We also discussed getting a ZIO monitor as well as increasing his diuretics for short time to get his fluid level under control.  He was seen by Dr. [name] a few weeks ago (01/17/23) and at that time he was encouraged to take spironolactone 25 mg daily and decrease his Zaroxolyn to 1.25 mg every other day.  He is currently on 80 mEq every other day and 60 mEq on the other days of potassium for hypokalemia.  Last potassium was 2.9 yesterday.  It was redrawn today and results were not available to view quite yet.  He still takes some steroids before chemo which probably is not helping with his fluid status.  He states he was recently in [place] for his grandsons birthday.  He turned 1.  They had a good time.  We discussed his spironolactone and that he needs to continue that at 25 mg daily and continue half a tab of metolazone every other day.  His Demadex did not change today and he remains on 40 mg daily.  His weight has been all over the place.  States he can gain 3 pounds 2 days in a row and then lose 3 pounds 2 days in a row quite easily.  No shortness of breath today.  No chest pain.  Today, he presents with a history of cancer and heart failure, with back muscle tightness following a surgery in February. He reports that the tightness in his back since his surgery earlier this year. The  patient is currently on chemotherapy; however, recent blood work indicates that the current regimen is not effective. The patient is considering going to [facility] for a new regimen.  The patient also reports some fluid retention and increased blood pressure following a recent cruise. He has been monitoring his blood pressure at home, and it has been high in the past few days, reaching up to 160s. The patient has been taking hydralazine 100mg  three times a day, amiodarone 200mg  daily, and Entresto 49-51mg  daily for blood pressure control. He has also been on diuretics including torasemide 40mg , spironolactone 25mg , and metolazone 2.5mg  as needed for fluid overload.  The patient also reports that he has been gaining weight recently, which he attributes to the food on the cruise. He has been taking steroids every  Wednesday morning before chemotherapy, which he believes may be contributing to fluid retention and increased blood pressure. The patient also had cataract surgery recently, which he believes was exacerbated by the steroids.  Reports no shortness of breath nor dyspnea on exertion. Reports no chest pain, pressure, or tightness. No edema, orthopnea, PND. Reports no palpitations.   Discussed the use of AI scribe software for clinical note transcription with the patient, who gave verbal consent to proceed.  ROS: Pertinent ROS in HPI  Studies Reviewed: .       Echocardiogram 09/08/2022 IMPRESSIONS     1. Left ventricular ejection fraction, by estimation, is 60 to 65%. The  left ventricle has normal function. The left ventricle has no regional  wall motion abnormalities. There is severe concentric left ventricular  hypertrophy. Left ventricular diastolic   parameters are indeterminate.   2. Right ventricular systolic function is normal. The right ventricular  size is mildly enlarged. There is normal pulmonary artery systolic  pressure.   3. Left atrial size was severely dilated.   4. Right  atrial size was mildly dilated.   5. The mitral valve is normal in structure. No evidence of mitral valve  regurgitation. No evidence of mitral stenosis.   6. The aortic valve is normal in structure. Aortic valve regurgitation is  not visualized. No aortic stenosis is present.   7. There is mild dilatation of the ascending aorta, measuring 37 mm.   8. The inferior vena cava is dilated in size with <50% respiratory  variability, suggesting right atrial pressure of 15 mmHg.   FINDINGS   Left Ventricle: Left ventricular ejection fraction, by estimation, is 60  to 65%. The left ventricle has normal function. The left ventricle has no  regional wall motion abnormalities. The global longitudinal strain is  normal despite suboptimal segment  tracking. The left ventricular internal cavity size was normal in size.  There is severe concentric left ventricular hypertrophy. Left ventricular  diastolic parameters are indeterminate.   Right Ventricle: The right ventricular size is mildly enlarged. No  increase in right ventricular wall thickness. Right ventricular systolic  function is normal. There is normal pulmonary artery systolic pressure.  The tricuspid regurgitant velocity is 2.12   m/s, and with an assumed right atrial pressure of 8 mmHg, the estimated  right ventricular systolic pressure is 26.0 mmHg.   Left Atrium: Left atrial size was severely dilated.   Right Atrium: Right atrial size was mildly dilated.   Pericardium: There is no evidence of pericardial effusion. Presence of  epicardial fat layer.   Mitral Valve: The mitral valve is normal in structure. No evidence of  mitral valve regurgitation. No evidence of mitral valve stenosis.   Tricuspid Valve: The tricuspid valve is normal in structure. Tricuspid  valve regurgitation is trivial. No evidence of tricuspid stenosis.   Aortic Valve: The aortic valve is normal in structure. Aortic valve  regurgitation is not visualized. No  aortic stenosis is present.   Pulmonic Valve: The pulmonic valve was not well visualized. Pulmonic valve  regurgitation is trivial. No evidence of pulmonic stenosis.   Aorta: There is mild dilatation of the ascending aorta, measuring 37 mm.   Venous: The inferior vena cava is dilated in size with less than 50%  respiratory variability, suggesting right atrial pressure of 15 mmHg.   IAS/Shunts: No atrial level shunt detected by color flow Doppler.       Physical Exam:   VS:  BP 136/66   Pulse  64   Ht 5\' 10"  (1.778 m)   Wt 264 lb 3.2 oz (119.8 kg)   SpO2 96%   BMI 37.91 kg/m    Wt Readings from Last 3 Encounters:  03/11/23 264 lb 3.2 oz (119.8 kg)  03/09/23 265 lb (120.2 kg)  02/16/23 261 lb 4 oz (118.5 kg)    GEN: Well nourished, well developed in no acute distress NECK: No JVD; No carotid bruits CARDIAC:sinus bradycardia, no murmurs, rubs, gallops RESPIRATORY:  Clear to auscultation without rales, wheezing or rhonchi  ABDOMEN: Soft, non-tender, distended EXTREMITIES:  No edema; No deformity   ASSESSMENT AND PLAN: .   Paroxysmal atrial fibrillation -NSR today -Advised to continue amiodarone and metoprolol -zio monitor reviewed with about 50% of the time in Afib -Not symptomatic at this time  Smoldering multiple myeloma Currently on chemotherapy, but labs indicate it may not be effective. Considering a [facility] regimen at [facility]. -Continue current chemotherapy regimen. -Consider consultation at [facility] or [facility] for potential change in chemotherapy regimen.  Congestive Heart Failure/HFpEF Fluid overload managed with diuretics (Torsemide 40mg , Spironolactone 25mg , Metolazone 2.5mg  as needed). Recent cruise may have led to increased fluid retention. Potassium supplementation required due to diuretic use. -Continue current diuretic regimen. -Continue potassium supplementation (60 mEq daily). -Weigh daily and monitor for increased fluid retention. If weight increases by 2 lbs  overnight or 5 lbs in a week, take Metolazone for a few days. -Check blood pressure regularly. If consistently elevated, consider increasing Entresto dose.  Hypertension Managed with Hydralazine 100mg  TID. Recent blood pressure readings have been high, potentially due to increased sodium intake during recent cruise. -Continue Hydralazine 100mg  TID. -Monitor blood pressure regularly. If consistently elevated, notify provider for potential medication adjustment.  Atrial Fibrillation No recent episodes. Not currently taking Metoprolol. -Continue current management plan.   General Health Maintenance -Continue Eliquis for anticoagulation. -Continue strength training and physical therapy for leg weakness. -Consider CT scan to assess bone health in light of cancer treatment. -Follow-up after consultation at [facility] or [facility].       Dispo: He can follow-up in a few months with Dr. [name]  Signed, Sharlene Dory, PA-C

## 2023-03-11 NOTE — Patient Instructions (Signed)
Medication Instructions:  Your physician recommends that you continue on your current medications as directed. Please refer to the Current Medication list given to you today.  *If you need a refill on your cardiac medications before your next appointment, please call your pharmacy*   Lab Work: None ordered  If you have labs (blood work) drawn today and your tests are completely normal, you will receive your results only by: MyChart Message (if you have MyChart) OR A paper copy in the mail If you have any lab test that is abnormal or we need to change your treatment, we will call you to review the results.   Testing/Procedures: None ordered   Follow-Up: At [facility], you and your health needs are our priority.  As part of our continuing mission to provide you with exceptional heart care, we have created designated Provider Care Teams.  These Care Teams include your primary Cardiologist (physician) and Advanced Practice Providers (APPs -  Physician Assistants and Nurse Practitioners) who all work together to provide you with the care you need, when you need it.  We recommend signing up for the patient portal called "MyChart".  Sign up information is provided on this After Visit Summary.  MyChart is used to connect with patients for Virtual Visits (Telemedicine).  Patients are able to view lab/test results, encounter notes, upcoming appointments, etc.  Non-urgent messages can be sent to your provider as well.   To learn more about what you can do with MyChart, go to [email or web address].    Your next appointment:   05/24/2023 3:20 (arrive at 3:15)   Provider:   [name], MD     Other Instructions

## 2023-03-13 ENCOUNTER — Other Ambulatory Visit: Payer: Self-pay | Admitting: Internal Medicine

## 2023-03-14 ENCOUNTER — Other Ambulatory Visit: Payer: Self-pay | Admitting: *Deleted

## 2023-03-14 ENCOUNTER — Telehealth: Payer: Self-pay | Admitting: *Deleted

## 2023-03-14 DIAGNOSIS — C9000 Multiple myeloma not having achieved remission: Secondary | ICD-10-CM

## 2023-03-14 LAB — MULTIPLE MYELOMA PANEL, SERUM
Albumin SerPl Elph-Mcnc: 3.6 g/dL (ref 2.9–4.4)
Albumin/Glob SerPl: 1.9 — ABNORMAL HIGH (ref 0.7–1.7)
Alpha 1: 0.2 g/dL (ref 0.0–0.4)
Alpha2 Glob SerPl Elph-Mcnc: 0.7 g/dL (ref 0.4–1.0)
B-Globulin SerPl Elph-Mcnc: 0.8 g/dL (ref 0.7–1.3)
Gamma Glob SerPl Elph-Mcnc: 0.2 g/dL — ABNORMAL LOW (ref 0.4–1.8)
Globulin, Total: 1.9 g/dL — ABNORMAL LOW (ref 2.2–3.9)
IgA: 14 mg/dL — ABNORMAL LOW (ref 61–437)
IgG (Immunoglobin G), Serum: 201 mg/dL — ABNORMAL LOW (ref 603–1613)
IgM (Immunoglobulin M), Srm: 15 mg/dL (ref 15–143)
Total Protein ELP: 5.5 g/dL — ABNORMAL LOW (ref 6.0–8.5)

## 2023-03-14 NOTE — Telephone Encounter (Signed)
Received call from pt requesting to continue his PT @ [facility] for strengthening, endurance and balance Referral sent

## 2023-03-16 ENCOUNTER — Other Ambulatory Visit: Payer: Self-pay | Admitting: Hematology and Oncology

## 2023-03-16 DIAGNOSIS — C9000 Multiple myeloma not having achieved remission: Secondary | ICD-10-CM

## 2023-03-17 ENCOUNTER — Inpatient Hospital Stay: Payer: Medicare Other

## 2023-03-17 VITALS — BP 140/68 | HR 62 | Temp 97.9°F | Resp 18

## 2023-03-17 DIAGNOSIS — G4733 Obstructive sleep apnea (adult) (pediatric): Secondary | ICD-10-CM | POA: Diagnosis not present

## 2023-03-17 DIAGNOSIS — M48061 Spinal stenosis, lumbar region without neurogenic claudication: Secondary | ICD-10-CM | POA: Diagnosis not present

## 2023-03-17 DIAGNOSIS — Z5112 Encounter for antineoplastic immunotherapy: Secondary | ICD-10-CM | POA: Diagnosis not present

## 2023-03-17 DIAGNOSIS — M533 Sacrococcygeal disorders, not elsewhere classified: Secondary | ICD-10-CM | POA: Diagnosis not present

## 2023-03-17 DIAGNOSIS — C9000 Multiple myeloma not having achieved remission: Secondary | ICD-10-CM

## 2023-03-17 DIAGNOSIS — Z9089 Acquired absence of other organs: Secondary | ICD-10-CM | POA: Diagnosis not present

## 2023-03-17 DIAGNOSIS — Z86718 Personal history of other venous thrombosis and embolism: Secondary | ICD-10-CM | POA: Diagnosis not present

## 2023-03-17 DIAGNOSIS — Z823 Family history of stroke: Secondary | ICD-10-CM | POA: Diagnosis not present

## 2023-03-17 DIAGNOSIS — I480 Paroxysmal atrial fibrillation: Secondary | ICD-10-CM | POA: Diagnosis not present

## 2023-03-17 DIAGNOSIS — M5126 Other intervertebral disc displacement, lumbar region: Secondary | ICD-10-CM | POA: Diagnosis not present

## 2023-03-17 DIAGNOSIS — Z8249 Family history of ischemic heart disease and other diseases of the circulatory system: Secondary | ICD-10-CM | POA: Diagnosis not present

## 2023-03-17 DIAGNOSIS — Z923 Personal history of irradiation: Secondary | ICD-10-CM | POA: Diagnosis not present

## 2023-03-17 DIAGNOSIS — Z7901 Long term (current) use of anticoagulants: Secondary | ICD-10-CM | POA: Diagnosis not present

## 2023-03-17 DIAGNOSIS — Z7952 Long term (current) use of systemic steroids: Secondary | ICD-10-CM | POA: Diagnosis not present

## 2023-03-17 DIAGNOSIS — Z79899 Other long term (current) drug therapy: Secondary | ICD-10-CM | POA: Diagnosis not present

## 2023-03-17 DIAGNOSIS — Z881 Allergy status to other antibiotic agents status: Secondary | ICD-10-CM | POA: Diagnosis not present

## 2023-03-17 DIAGNOSIS — E876 Hypokalemia: Secondary | ICD-10-CM | POA: Diagnosis not present

## 2023-03-17 DIAGNOSIS — Z7961 Long term (current) use of immunomodulator: Secondary | ICD-10-CM | POA: Diagnosis not present

## 2023-03-17 DIAGNOSIS — I10 Essential (primary) hypertension: Secondary | ICD-10-CM | POA: Diagnosis not present

## 2023-03-17 DIAGNOSIS — R1310 Dysphagia, unspecified: Secondary | ICD-10-CM | POA: Diagnosis not present

## 2023-03-17 LAB — CBC WITH DIFFERENTIAL (CANCER CENTER ONLY)
Abs Immature Granulocytes: 0.09 10*3/uL — ABNORMAL HIGH (ref 0.00–0.07)
Basophils Absolute: 0 10*3/uL (ref 0.0–0.1)
Basophils Relative: 0 %
Eosinophils Absolute: 0 10*3/uL (ref 0.0–0.5)
Eosinophils Relative: 0 %
HCT: 37.6 % — ABNORMAL LOW (ref 39.0–52.0)
Hemoglobin: 12.7 g/dL — ABNORMAL LOW (ref 13.0–17.0)
Immature Granulocytes: 1 %
Lymphocytes Relative: 3 %
Lymphs Abs: 0.5 10*3/uL — ABNORMAL LOW (ref 0.7–4.0)
MCH: 32.4 pg (ref 26.0–34.0)
MCHC: 33.8 g/dL (ref 30.0–36.0)
MCV: 95.9 fL (ref 80.0–100.0)
Monocytes Absolute: 1.1 10*3/uL — ABNORMAL HIGH (ref 0.1–1.0)
Monocytes Relative: 7 %
Neutro Abs: 14.7 10*3/uL — ABNORMAL HIGH (ref 1.7–7.7)
Neutrophils Relative %: 89 %
Platelet Count: 149 10*3/uL — ABNORMAL LOW (ref 150–400)
RBC: 3.92 MIL/uL — ABNORMAL LOW (ref 4.22–5.81)
RDW: 15.8 % — ABNORMAL HIGH (ref 11.5–15.5)
WBC Count: 16.5 10*3/uL — ABNORMAL HIGH (ref 4.0–10.5)
nRBC: 0 % (ref 0.0–0.2)

## 2023-03-17 LAB — CMP (CANCER CENTER ONLY)
ALT: 16 U/L (ref 0–44)
AST: 16 U/L (ref 15–41)
Albumin: 4.5 g/dL (ref 3.5–5.0)
Alkaline Phosphatase: 75 U/L (ref 38–126)
Anion gap: 7 (ref 5–15)
BUN: 30 mg/dL — ABNORMAL HIGH (ref 8–23)
CO2: 29 mmol/L (ref 22–32)
Calcium: 9.6 mg/dL (ref 8.9–10.3)
Chloride: 103 mmol/L (ref 98–111)
Creatinine: 1.03 mg/dL (ref 0.61–1.24)
GFR, Estimated: >60 mL/min
Glucose, Bld: 118 mg/dL — ABNORMAL HIGH (ref 70–99)
Potassium: 3.9 mmol/L (ref 3.5–5.1)
Sodium: 139 mmol/L (ref 135–145)
Total Bilirubin: 0.6 mg/dL
Total Protein: 6.7 g/dL (ref 6.5–8.1)

## 2023-03-17 MED ORDER — DEXTROSE 5 % IV SOLN
70.0000 mg/m2 | Freq: Once | INTRAVENOUS | Status: AC
  Administered 2023-03-17: 150 mg via INTRAVENOUS
  Filled 2023-03-17: qty 60

## 2023-03-17 MED ORDER — SODIUM CHLORIDE 0.9 % IV SOLN: Freq: Once | INTRAVENOUS | Status: AC

## 2023-03-17 NOTE — Patient Instructions (Signed)
[facility] OF [facility]  Discharge Instructions: Thank you for choosing [facility] to provide your oncology and hematology care.   If you have a lab appointment with the Cancer Center, please go directly to the Cancer Center and check in at the registration area.   Wear comfortable clothing and clothing appropriate for easy access to any Portacath or PICC line.   We strive to give you quality time with your provider. You may need to reschedule your appointment if you arrive late (15 or more minutes).  Arriving late affects you and other patients whose appointments are after yours.  Also, if you miss three or more appointments without notifying the office, you may be dismissed from the clinic at the provider's discretion.      For prescription refill requests, have your pharmacy contact our office and allow 72 hours for refills to be completed.    Today you received the following chemotherapy and/or immunotherapy agents Kyprolis.      To help prevent nausea and vomiting after your treatment, we encourage you to take your nausea medication as directed.  BELOW ARE SYMPTOMS THAT SHOULD BE REPORTED IMMEDIATELY: *FEVER GREATER THAN 100.4 F (38 C) OR HIGHER *CHILLS OR SWEATING *NAUSEA AND VOMITING THAT IS NOT CONTROLLED WITH YOUR NAUSEA MEDICATION *UNUSUAL SHORTNESS OF BREATH *UNUSUAL BRUISING OR BLEEDING *URINARY PROBLEMS (pain or burning when urinating, or frequent urination) *BOWEL PROBLEMS (unusual diarrhea, constipation, pain near the anus) TENDERNESS IN MOUTH AND THROAT WITH OR WITHOUT PRESENCE OF ULCERS (sore throat, sores in mouth, or a toothache) UNUSUAL RASH, SWELLING OR PAIN  UNUSUAL VAGINAL DISCHARGE OR ITCHING   Items with * indicate a potential emergency and should be followed up as soon as possible or go to the Emergency Department if any problems should occur.  Please show the CHEMOTHERAPY ALERT CARD or  IMMUNOTHERAPY ALERT CARD at check-in to the Emergency Department and triage nurse.  Should you have questions after your visit or need to cancel or reschedule your appointment, please contact [facility] - A DEPT OF [facility]  Dept: [phone]  and follow the prompts.  Office hours are 8:00 a.m. to 4:30 p.m. Monday - Friday. Please note that voicemails left after 4:00 p.m. may not be returned until the following business day.  We are closed weekends and major holidays. You have access to a nurse at all times for urgent questions. Please call the main number to the clinic Dept: [phone] and follow the prompts.   For any non-urgent questions, you may also contact your provider using MyChart. We now offer e-Visits for anyone 74 and older to request care online for non-urgent symptoms. For details visit [email or web address].   Also download the MyChart app! Go to the app store, search "MyChart", open the app, select , and log in with your MyChart username and password.

## 2023-03-20 NOTE — Progress Notes (Signed)
[facility] [phone]   [phone]  PROGRESS NOTE  Patient Care Team: Travis Aspen, MD as PCP - General (Internal Medicine) Travis Riffle, MD as PCP - Cardiology (Cardiology) Travis Reichert, MD as PCP - Sleep Medicine (Cardiology) Travis Prude, MD as PCP - Electrophysiology (Cardiology) Travis Soda, MD as Consulting Physician (General Surgery) Travis Rigger, MD as Consulting Physician (Gastroenterology) Travis Reichert, MD as Consulting Physician (Sleep Medicine) Travis Chard, DO as Consulting Physician (Neurology)  Hematological/Oncological History # IgA Kappa Multiple Myeloma 05/05/2020: Bone marrow biopsy showed increased number of plasma cells representing 14% of all cells in the aspirate associated with numerous variably sized clusters in the clot and biopsy sections 04/10/2021: M protein 0.3, IgA kappa specificity. Kappa 580.5, Labda 7.0, ratio 82.93.   12/07/2021: Labs show of 1971.3, lambda 5.5, ratio 358.42 12/10/2021: Transition care to Dr. Leonides Schanz. 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  01/26/2022: Cycle 2 Day 1 of Dara/Rev/Dex 02/22/2022: Cycle 3 Day 1 of Dara/Rev/Dex 03/23/2022: Cycle 4 Day 1 of Dara/Rev/Dex 04/20/2022: Cycle 5 Day 1 of Dara/Rev/Dex 05/18/2022: Cycle 6 Day 1 of Dara/Rev/Dex 05/27/2022: MRI cervical/thoracic/lumbar: pathologic fracture at T2 with significant osseous retropulsion and resulting cord compression. Additional smaller lesions within T1 vertebral body, right C7 and T3 articulating facets.  Small Multiple Myeloma lesions scattered at all lumbar levels and in the visible left sacral ala. Largest lumbar tumor is 2.3 cm in the left L1 posterior elements and pedicle. No lumbar extraosseous or epidural tumor extension, or pathologic fracture.Underlying chronic lumbar spine  degeneration, with a new central disc herniation at L3-L4 since 2021 now resulting in mild to moderate degenerative spinal stenosis there. 05/28/2022-06/09/2022: Admitted for T2 pathologic fracture and cord compression. He received urgent radiation therapy for a total of 3.0 Gy. He underwent  thoracic 1 through 4 posterior lateral arthrodesis/fusion and 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  06/23/2022: Cycle 1 Day 1 of Velcade/Pomalyst/Dex 08/04/2022: Cycle 2 Day 1 of Velcade/Pomalyst/Dex. Pomalyst to be decreased to 2mg  PO daily due to cytopenias.  09/01/2022: Cycle 3 Day 1 of Velcade/Pomalyst/Dex. (Missed 2nd dose of cycle due to hospitalization)  10/06/2022: Cycle 4 Day 1 of Velcade/Pomalyst/Dex.  10/27/2022: Cycle 5 Day 1 of Velcade/Pomalyst/Dex.  12/02/2022: Cycle 6 Day 1 of Velcade/Pomalyst/Dex. 01/12/2023: Cycle 1 Day 1 of Kyprolis/Dex 02/09/2023: Cycle 2 Day 1 of Kyprolis/Dex    Interval History:  Travis [name] 72 y.o. male with medical history significant for IgA Kappa Multiple Myeloma who presents for a follow up visit. He was last seen on 03/09/2023. In the interim, he continued on Kyprolis/Dex therapy.  On exam today [name] reports he did have some pain and soreness yesterday in his shoulders, particular on the left side.  He reports that the muscles in his upper back have been stiff and painful.  He reports that he is not having any numbness or tingling of his fingers or toes.  He is not having any nausea, vomiting, or diarrhea.  He reports that he does feel like he is very thirsty and drinking plenty of water.  He does have a follow-up visit scheduled with [facility].  Overall he is tolerating his Kyprolis therapy well without any major side effects or symptoms.  He denies fevers, chills, sweats, shortness of breath, chest pain or cough. He has no other complaints. Rest of  the 10 point ROS is below.    MEDICAL HISTORY:  Past Medical History:  Diagnosis  Date   A-fib [facility])    Arthritis    "comes and goes; mostly in hands, occasionally elbow" (04/05/2017)   Complication of anesthesia    "just wanted to sleep alot  for couple days S/P VARICOCELE OR" (04/05/2017)   DVT (deep venous thrombosis) (HCC)    LLE   Dysrhythmia    PVCs    GERD (gastroesophageal reflux disease)    History of hiatal hernia    History of radiation therapy    Thoracic Spine- 07/13/22-07/23/22-Dr. Antony Blackbird   Hypertension    Impaired glucose tolerance    Multiple myeloma (HCC)    Obesity (BMI 30-39.9) 07/26/2016   OSA on CPAP 07/26/2016   Mild with AHI 12.5/hr now on CPAP at 14cm H2O   PAF (paroxysmal atrial fibrillation) (HCC)    s/p PVI Ablation in 2018 // Flecainide Rx // Echo 8/21: EF 60-65, no RWMA, mild LVH, normal RV SF, moderate RVE, mild BAE, trivial MR, mild dilation of aortic root (40 mm)   Pneumonia    "may have had walking pneumonia a few years ago" (04/05/2017)   Pre-diabetes    Thrombophlebitis     SURGICAL HISTORY: Past Surgical History:  Procedure Laterality Date   ATRIAL FIBRILLATION ABLATION  04/05/2017   ATRIAL FIBRILLATION ABLATION N/A 04/05/2017   Procedure: ATRIAL FIBRILLATION ABLATION;  Surgeon: [name], MD;  Location: [facility];  Service: Cardiovascular;  Laterality: N/A;   BONE BIOPSY Right 12/17/2021   Procedure: BONE BIOPSY;  Surgeon: [name], MD;  Location: [facility];  Service: Orthopedics;  Laterality: Right;   COMPLETE RIGHT HIP REPLACMENT  01/19/2019   EVALUATION UNDER ANESTHESIA WITH HEMORRHOIDECTOMY N/A 02/24/2017   Procedure: EXAM UNDER ANESTHESIA WITH  HEMORRHOIDECTOMY;  Surgeon: Travis Soda, MD;  Location: [facility];  Service: General;  Laterality: N/A;   EXCISIONAL TOTAL HIP ARTHROPLASTY WITH ANTIBIOTIC SPACERS Right 09/25/2020   Procedure: EXCISIONAL TOTAL HIP ARTHROPLASTY WITH ANTIBIOTIC SPACERS;  Surgeon: [name], MD;  Location: [facility];  Service: Orthopedics;  Laterality: Right;    FLEXIBLE SIGMOIDOSCOPY N/A 04/15/2015   Procedure:  UNSEDATED FLEXIBLE SIGMOIDOSCOPY;  Surgeon: [name], MD;  Location: [facility];  Service: Endoscopy;  Laterality: N/A;   HUMERUS IM NAIL Right 12/17/2021   Procedure: INTRAMEDULLARY (IM) NAIL HUMERAL;  Surgeon: [name], MD;  Location: [facility];  Service: Orthopedics;  Laterality: Right;   KNEE ARTHROSCOPY Left ~ 2016   POSTERIOR CERVICAL FUSION/FORAMINOTOMY N/A 06/04/2022   Procedure: Thoracic one - Thoracic Four Posterior lateral arthrodesis/ fusion and Thoracic two Corpectomy;  Surgeon: [name], MD;  Location: [facility];  Service: Neurosurgery;  Laterality: N/A;   REIMPLANTATION OF TOTAL HIP Right 12/25/2020   Procedure: REIMPLANTATION/REVISION OF RIGHT TOTAL HIP VERSUS REPEAT IRRIGATION AND DEBRIDEMENT;  Surgeon: [name], MD;  Location: [facility];  Service: Orthopedics;  Laterality: Right;   TESTICLE SURGERY  1988   VARICOCELE   TONSILLECTOMY     VARICOSE VEIN SURGERY Bilateral     SOCIAL HISTORY: Social History   Socioeconomic History   Marital status: Married    Spouse name: Not on file   Number of children: 1   Years of education: law school   Highest education level: Not on file  Occupational History   Occupation: lawyer  Tobacco Use   Smoking status: Never   Smokeless tobacco: Never  Vaping  Use   Vaping status: Never Used  Substance and Sexual Activity   Alcohol use: Yes    Alcohol/week: 2.0 standard drinks of alcohol    Types: 2 Glasses of wine per week    Comment: 2 glasses of wine a week, occ bourbon, beer   Drug use: No   Sexual activity: Not Currently  Other Topics Concern   Not on file  Social History Narrative   Lives in [place] with spouse in a 2 story home.  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 1 daughter.     Education: Social worker school.    Social Determinants of Health   Financial Resource Strain: Not on file  Food Insecurity: No Food  Insecurity (09/27/2022)   Hunger Vital Sign    Worried About Running Out of Food in the Last Year: Never true    Ran Out of Food in the Last Year: Never true  Transportation Needs: No Transportation Needs (09/27/2022)   PRAPARE - Administrator, Civil Service (Medical): No    Lack of Transportation (Non-Medical): No  Physical Activity: Not on file  Stress: Not on file  Social Connections: Unknown (09/15/2021)   Received from [facility]   Social Network    Social Network: Not on file  Intimate Partner Violence: Not At Risk (09/27/2022)   Humiliation, Afraid, Rape, and Kick questionnaire    Fear of Current or Ex-Partner: No    Emotionally Abused: No    Physically Abused: No    Sexually Abused: No    FAMILY HISTORY: Family History  Problem Relation Age of Onset   Dementia Mother    Stroke Father    Atrial fibrillation Father    Hypertension Father    Hypertension Paternal Grandfather    Dementia Maternal Grandmother     ALLERGIES:  is allergic to linezolid and vancomycin.  MEDICATIONS:  Current Outpatient Medications  Medication Sig Dispense Refill   acyclovir (ZOVIRAX) 400 MG tablet Take 1 tablet (400 mg total) by mouth daily. 30 tablet 3   albuterol (VENTOLIN HFA) 108 (90 Base) MCG/ACT inhaler Inhale 2 puffs into the lungs every 6 (six) hours as needed for wheezing or shortness of breath. 8 g 0   amiodarone (PACERONE) 200 MG tablet Take 200 mg by mouth daily.     calcium carbonate (TUMS - DOSED IN MG ELEMENTAL CALCIUM) 500 MG chewable tablet Chew 1 tablet by mouth daily.     cholecalciferol (VITAMIN D3) 25 MCG (1000 UNIT) tablet Take 1,000 Units by mouth daily.     Cyanocobalamin (VITAMIN B-12 PO) Take 1,000 mcg by mouth daily.     dexamethasone (DECADRON) 4 MG tablet Take 5 tablets (20 mg total) by mouth every 7 (seven) days. Take 5 tabs (20 mg) by mouth once a day in the morning of chemo on Wednesdays- or unless otherwise instructed 20 tablet 3    ELIQUIS 5 MG TABS tablet TAKE ONE TABLET BY MOUTH TWICE A DAY 60 tablet 6   fluticasone (FLONASE) 50 MCG/ACT nasal spray Place 1 spray into both nostrils daily as needed for allergies or rhinitis.     gabapentin (NEURONTIN) 300 MG capsule Take 2 capsules (600 mg total) by mouth 2 (two) times daily. 360 capsule 1   hydrALAZINE (APRESOLINE) 50 MG tablet Take 1 tablet (50 mg total) by mouth 3 (three) times daily. 135 tablet 3   ketoconazole (NIZORAL) 2 % cream as needed for irritation.  metolazone (ZAROXOLYN) 2.5 MG tablet Take 1 tablet (2.5 mg total) by mouth every other day. (Patient taking differently: Take 2.5 mg by mouth 3 times/day as needed-between meals & bedtime (edema).)     polyethylene glycol powder (GLYCOLAX/MIRALAX) 17 GM/SCOOP powder Take 1 g by mouth daily.     potassium chloride SA (KLOR-CON M) 20 MEQ tablet TAKE TWO TABLETS BY MOUTH ONE TIME DAILY - TAKE AN EXTRA TABLET ON THE DAY YOU TAKE THE METOLAZONE ( TOTAL) 225 tablet 3   psyllium (METAMUCIL) 58.6 % powder Take by mouth at bedtime. 3 tsp daily hs     sacubitril-valsartan (ENTRESTO) 49-51 MG Take 1 tablet by mouth 2 (two) times daily.     sodium chloride (OCEAN) 0.65 % SOLN nasal spray Place 1 spray into both nostrils as needed for congestion.     spironolactone (ALDACTONE) 25 MG tablet Take 1 tablet (25 mg total) by mouth daily.     TART CHERRY PO Take 1 tablet by mouth at bedtime.     torsemide (DEMADEX) 20 MG tablet Take 2 tablets (40 mg total) by mouth daily. 90 tablet 3   No current facility-administered medications for this visit.    REVIEW OF SYSTEMS:   Constitutional: ( - ) fevers, ( - )  chills , ( - ) night sweats Eyes: ( - ) blurriness of vision, ( - ) double vision, ( - ) watery eyes Ears, nose, mouth, throat, and face: ( - ) mucositis, ( - ) sore throat Respiratory: ( - ) cough, ( - ) dyspnea, ( - ) wheezes Cardiovascular: ( - ) palpitation, ( - ) chest discomfort, ( - ) lower extremity  swelling Gastrointestinal:  ( - ) nausea, ( - ) heartburn, ( - ) change in bowel habits Skin: ( - ) abnormal skin rashes Lymphatics: ( - ) new lymphadenopathy, ( - ) easy bruising Neurological: ( - ) numbness, ( - ) tingling, ( - ) new weaknesses Behavioral/Psych: ( - ) mood change, ( - ) new changes  All other systems were reviewed with the patient and are negative.  PHYSICAL EXAMINATION: ECOG PERFORMANCE STATUS: 1 - Symptomatic but completely ambulatory  Vitals:   03/23/23 1022  BP: (!) 138/59  Pulse: (!) 57  Resp: 16  Temp: 98.3 F (36.8 C)  SpO2: 94%   Filed Weights   03/23/23 1022  Weight: 261 lb 11.2 oz (118.7 kg)    GENERAL: Well-appearing elderly Caucasian male, alert, no distress and comfortable SKIN: no overt abnormalities of the skin.  EYES: conjunctiva are pink and non-injected, sclera clear LUNGS: clear to auscultation and percussion with normal breathing effort HEART: tachycardic &  no murmurs. +bilateral trace lower extremity edema Musculoskeletal: no cyanosis of digits and no clubbing  PSYCH: alert & oriented x 3, fluent speech NEURO: no focal motor/sensory deficits  LABORATORY DATA:  I have reviewed the data as listed    Latest Ref Rng & Units 03/23/2023    9:18 AM 03/17/2023    1:49 PM 03/09/2023   11:13 AM  CBC  WBC 4.0 - 10.5 K/uL 6.9  16.5  8.1   Hemoglobin 13.0 - 17.0 g/dL 16.1  09.6  04.5   Hematocrit 39.0 - 52.0 % 35.9  37.6  34.8   Platelets 150 - 400 K/uL 131  149  196        Latest Ref Rng & Units 03/23/2023    9:18 AM 03/17/2023    1:49 PM 03/09/2023   11:13  AM  CMP  Glucose 70 - 99 mg/dL 161  096  045   BUN 8 - 23 mg/dL 29  30  18    Creatinine 0.61 - 1.24 mg/dL 4.09  8.11  9.14   Sodium 135 - 145 mmol/L 140  139  141   Potassium 3.5 - 5.1 mmol/L 3.8  3.9  3.9   Chloride 98 - 111 mmol/L 107  103  109   CO2 22 - 32 mmol/L 27  29  26    Calcium 8.9 - 10.3 mg/dL 8.8  9.6  9.2   Total Protein 6.5 - 8.1 g/dL 5.5  6.7  5.9   Total  Bilirubin <1.2 mg/dL 0.5  0.6  0.5   Alkaline Phos 38 - 126 U/L 74  75  58   AST 15 - 41 U/L 12  16  19    ALT 0 - 44 U/L 13  16  23      Lab Results  Component Value Date   MPROTEIN Not Observed 03/09/2023   MPROTEIN Not Observed 02/16/2023   MPROTEIN Not Observed 12/29/2022   Lab Results  Component Value Date   KPAFRELGTCHN 270.6 (H) 03/09/2023   KPAFRELGTCHN 249.0 (H) 02/16/2023   KPAFRELGTCHN 97.8 (H) 12/29/2022   LAMBDASER 3.3 (L) 03/09/2023   LAMBDASER 4.4 (L) 02/16/2023   LAMBDASER 15.9 12/29/2022   KAPLAMBRATIO 82.00 (H) 03/09/2023   KAPLAMBRATIO 56.59 (H) 02/16/2023   KAPLAMBRATIO 6.15 (H) 12/29/2022     RADIOGRAPHIC STUDIES: CT CHEST ABDOMEN PELVIS W CONTRAST  Result Date: 03/23/2023 CLINICAL DATA:  Hematologic malignancy, assess treatment response. * Tracking Code: BO * EXAM: CT CHEST, ABDOMEN, AND PELVIS WITH CONTRAST TECHNIQUE: Multidetector CT imaging of the chest, abdomen and pelvis was performed following the standard protocol during bolus administration of intravenous contrast. RADIATION DOSE REDUCTION: This exam was performed according to the departmental dose-optimization program which includes automated exposure control, adjustment of the mA and/or kV according to patient size and/or use of iterative reconstruction technique. CONTRAST:  OMNIPAQUE IOHEXOL 300 MG/ML  SOLN COMPARISON:  Multiple priors including CT December 30, 2022, October 25, 2022 and Sep 29, 2022 FINDINGS: CT CHEST FINDINGS Cardiovascular: Normal caliber thoracic aorta. No central pulmonary embolus on this nondedicated study. Normal size heart. No significant pericardial effusion/thickening. Mediastinum/Nodes: No suspicious thyroid nodule. No pathologically enlarged mediastinal, hilar or axillary lymph nodes. The esophagus is grossly unremarkable. Lungs/Pleura: A few tiny ground-glass opacities in the right upper for instance on image 57/7. The extensive ground-glass opacities seen on the prior chest  CT are predominantly resolved. There are few scattered areas of atelectasis/scarring. New subpleural nodule measuring 2.6 cm extending from a lytic lesion in the posterolateral left seventh rib on image 87/7. Small subpleural nodule in the right middle lobe extending from a lytic focus in the overlying sixth rib measures 5 mm on image 120/7, new from prior examination. Musculoskeletal: Innumerable lytic and sclerotic lesion seen throughout the axial and visualized appendicular skeleton. Lesions in the spine are similar to recent CT December 30, 2022 with a few lucent lesions within the ribs which are new from more remote chest CT Sep 29, 2022. For instance in the left posterolateral seventh rib on image 87/7 and right lateral sixth rib on image 120/7. Surgical hardware in the right humerus. Posterior spinal fusion hardware. CT ABDOMEN PELVIS FINDINGS Hepatobiliary: Cluster of cysts in the inferior right hepatic lobe again seen. No suspicious hepatic lesion. Gallbladder is unremarkable. No biliary ductal dilation. Pancreas: No pancreatic ductal dilation.  Two adjacent cystic lesions in the tail of the pancreas are unchanged from recent CT abdomen and pelvis with the largest measuring 3.0 x 2.5 cm on image 65/2, unchanged when remeasured for consistency. Spleen: No splenomegaly or focal splenic lesion. Adrenals/Urinary Tract: Bilateral adrenal glands appear normal. Nonobstructive bilateral renal stones measure up to 2 mm. No hydronephrosis. Subcentimeter bilateral hypodense renal lesions are technically too small to accurately characterize but statistically likely to reflect cysts. Urinary bladder is unremarkable for degree of distension. Stomach/Bowel: No radiopaque enteric contrast material was administered. Stomach is unremarkable for degree of distension. No pathologic dilation of small or large bowel. No evidence of acute bowel inflammation. Vascular/Lymphatic: Normal caliber abdominal aorta. Smooth IVC contours.  Aortic atherosclerosis. The portal, splenic and superior mesenteric veins are patent. No pathologically enlarged abdominal or pelvic lymph nodes. Reproductive: Prostate is unremarkable. Other: No significant abdominopelvic free fluid. Musculoskeletal: Innumerable lucent and sclerotic lesions throughout the axial and appendicular skeleton are overall similar to prior examination. No definite new osseous lesion identified. Right total hip arthroplasty. IMPRESSION: 1. Innumerable lytic and sclerotic lesions throughout the axial and visualized appendicular skeleton are overall similar to recent CT December 30, 2022 with a few lucent lesions within the ribs which are new from more remote chest CT Sep 29, 2022. 2. New subpleural nodules in the left upper lobe and right middle lobe extending from lytic lesions in the overlying ribs. 3. No evidence of lymphadenopathy in the chest, abdomen or pelvis. 4. Two adjacent cystic lesions in the tail of the pancreas are unchanged from recent CT abdomen and pelvis with the largest measuring 3.0 x 2.5 cm, consider further evaluation by pre and postcontrast enhanced MRCP. 5. Nonobstructive bilateral renal stones measure up to 2 mm. 6.  Aortic Atherosclerosis (ICD10-I70.0). Electronically Signed   By: Maudry Mayhew M.D.   On: 03/23/2023 10:23    ASSESSMENT & PLAN RHOAN MCQUARTER is a 72 y.o. male with medical history significant for IgA Kappa Multiple Myeloma who presents for a follow up visit.   # IgA Kappa Multiple Myeloma --Metastatic survey completed 12/15/2021 --Patient underwent fixation of his humerus on 12/17/2021, pathology confirmed plasmacytoma/plasma cell neoplasm.  --Received 6 cycles of Darzalex, Revlimid, and dexamethasone started on 12/28/2021-05/18/2022.  --Due to worsening myeloma osseous lesions including pathologic fracture at T2 with cord compression, Dr. Leonides Schanz recommends changing therapy to Velcade/Pomalyst/Dex.  --Due to rising kappa free light chains,  recommend to change therapy to Carfilzomib/Dex started on 01/12/2023. Plan: --Due for Cycle 3, Day 15 of Carfilzomib/Dex today --labs from today showed white blood cell 6.9, hemoglobin 11.8, MCV 98.6, platelets 131. Creatinine and calcium level normal.  --kappa 249, Lambda 4.4, ratio 56.59. Appears that kappa chains are increasing. Discussed options moving forward including referral back to [facility].  He does have an upcoming visit with [facility]. --Proceed with treatment today without any dose modifications.  --RTC in 2 weeks with interval weekly treatment     #Neck pain and mid back pain: --Suspect pain is exacerbated from recent hospitalization and being in bed/sitting for long periods.  --MRI C/T/L spine obtinaed that showed unchanged lytic lesions in the right C7 inferior articular process,T6, T8, and T10  vertebral bodies, as well  as the T11 spinous process. Unchanged  multilevel cervical spondylsis, Interval enlargement of disc herniation at L3-L4 with progressive spinal canal stenosis. No new or progressive lytic lesions.  --Encouraged to continue with physical therapy and follow up with ortho.   #Palliative Radiation  Therapy: # Dysphagia --Received palliative radiation from 01/06/2022-01/19/2022 to right hymerus, right forearm and right chest/rib, 20 Gy in 10 Fx.  --recieved palliative radiation to surgical bed in the upper thoracic spine area on 07/13/2022.  27 Gy in 9 Fx.   #Lower extremity edema--improved: --Currently on lasix and torsemide  # Rash Concerning for Shingles-resolved -- Prescribed valacyclovir 3 times daily 1000 mg x 10 days-completed the course -- We will continue to monitor rash closely.  #DVT: --Found to have acute DVT involving left peroneal veins. Likely secondary to recent surgery and hospitalization --Currently on eliquis 5 mg BID.    #Hypokalemia: --Potassium level is 3.8 today. Advised to continue with 40 mEq twice daily and  increase to 60 mEq on the days he takes torsemide/spironolactone  #Rash on scalp and right forearm: --Recommend topical hydrocortisone cream for spot treatment  #Supportive Care -- chemotherapy education complete -- port placement not required -- zofran 8mg  q8H PRN and compazine 10mg  PO q6H for nausea -- acyclovir 400mg  PO BID for VCZ prophylaxis -- Started Zometa q 12 weeks on 11/10/2022. Next dose due today.   Orders Placed This Encounter  Procedures   Multiple Myeloma Panel (SPEP&IFE w/QIG)    Standing Status:   Future    Standing Expiration Date:   04/05/2024   Kappa/lambda light chains    Standing Status:   Future    Standing Expiration Date:   04/05/2024   Multiple Myeloma Panel (SPEP&IFE w/QIG)    Standing Status:   Future    Standing Expiration Date:   05/03/2024   Kappa/lambda light chains    Standing Status:   Future    Standing Expiration Date:   05/03/2024   CBC with Differential (Cancer Center Only)    Standing Status:   Future    Standing Expiration Date:   05/03/2024   CMP (Cancer Center only)    Standing Status:   Future    Standing Expiration Date:   05/03/2024   CBC with Differential (Cancer Center Only)    Standing Status:   Future    Standing Expiration Date:   05/10/2024   CMP (Cancer Center only)    Standing Status:   Future    Standing Expiration Date:   05/10/2024   CBC with Differential (Cancer Center Only)    Standing Status:   Future    Standing Expiration Date:   05/17/2024   CMP (Cancer Center only)    Standing Status:   Future    Standing Expiration Date:   05/17/2024   All questions were answered. The patient knows to call the clinic with any problems, questions or concerns.  A total of more than 30 minutes were spent on this encounter with face-to-face time and non-face-to-face time, including preparing to see the patient, ordering tests and/or medications, counseling the patient and coordination of care as outlined above.   Ulysees Barns,  MD Department of Hematology/Oncology [facility] Phone: [phone] Pager: [phone] Email: [email or web address]  04/03/2023 5:14 PM

## 2023-03-20 NOTE — Assessment & Plan Note (Addendum)
IgA Kappa Multiple Myeloma --Metastatic survey completed 12/15/2021 --Patient underwent fixation of his humerus on 12/17/2021, pathology confirmed plasmacytoma/plasma cell neoplasm.  --Received 6 cycles of Darzalex, Revlimid, and dexamethasone started on 12/28/2021-05/18/2022.  --Due to worsening myeloma osseous lesions including pathologic fracture at T2 with cord compression, Dr. Leonides Schanz recommends changing therapy to Velcade/Pomalyst/Dex.  --Due to rising kappa free light chains, recommend to change therapy to Carfilzomib/Dex started on 01/12/2023. Plan: --Due for Cycle 3, Day 15 of Carfilzomib/Dex today --Proceed with treatment today without any dose modifications.  --RTC in 2 weeks with interval weekly treatment

## 2023-03-21 ENCOUNTER — Other Ambulatory Visit: Payer: Self-pay | Admitting: Internal Medicine

## 2023-03-22 ENCOUNTER — Ambulatory Visit (HOSPITAL_COMMUNITY)
Admission: RE | Admit: 2023-03-22 | Discharge: 2023-03-22 | Disposition: A | Discharge status: Home / Self Care | Payer: Medicare Other | Source: Ambulatory Visit | Attending: Hematology and Oncology | Admitting: Hematology and Oncology

## 2023-03-22 ENCOUNTER — Encounter (HOSPITAL_COMMUNITY): Payer: Self-pay

## 2023-03-22 DIAGNOSIS — D480 Neoplasm of uncertain behavior of bone and articular cartilage: Secondary | ICD-10-CM | POA: Diagnosis not present

## 2023-03-22 DIAGNOSIS — C9000 Multiple myeloma not having achieved remission: Secondary | ICD-10-CM | POA: Insufficient documentation

## 2023-03-22 DIAGNOSIS — R918 Other nonspecific abnormal finding of lung field: Secondary | ICD-10-CM | POA: Insufficient documentation

## 2023-03-22 DIAGNOSIS — K7689 Other specified diseases of liver: Secondary | ICD-10-CM | POA: Diagnosis not present

## 2023-03-22 DIAGNOSIS — D472 Monoclonal gammopathy: Secondary | ICD-10-CM | POA: Diagnosis not present

## 2023-03-22 DIAGNOSIS — I700 Atherosclerosis of aorta: Secondary | ICD-10-CM | POA: Diagnosis not present

## 2023-03-22 DIAGNOSIS — N200 Calculus of kidney: Secondary | ICD-10-CM | POA: Insufficient documentation

## 2023-03-22 MED ORDER — IOHEXOL 300 MG/ML  SOLN
100.0000 mL | Freq: Once | INTRAMUSCULAR | Status: AC | PRN
  Administered 2023-03-22: 100 mL via INTRAVENOUS

## 2023-03-23 ENCOUNTER — Inpatient Hospital Stay: Payer: Medicare Other

## 2023-03-23 ENCOUNTER — Encounter: Payer: Self-pay | Admitting: Hematology and Oncology

## 2023-03-23 ENCOUNTER — Inpatient Hospital Stay: Payer: Medicare Other | Admitting: Hematology and Oncology

## 2023-03-23 VITALS — BP 138/59 | HR 57 | Temp 98.3°F | Resp 16 | Wt 261.7 lb

## 2023-03-23 DIAGNOSIS — Z7952 Long term (current) use of systemic steroids: Secondary | ICD-10-CM | POA: Diagnosis not present

## 2023-03-23 DIAGNOSIS — Z881 Allergy status to other antibiotic agents status: Secondary | ICD-10-CM | POA: Diagnosis not present

## 2023-03-23 DIAGNOSIS — Z8249 Family history of ischemic heart disease and other diseases of the circulatory system: Secondary | ICD-10-CM | POA: Diagnosis not present

## 2023-03-23 DIAGNOSIS — Z79899 Other long term (current) drug therapy: Secondary | ICD-10-CM | POA: Diagnosis not present

## 2023-03-23 DIAGNOSIS — Z7901 Long term (current) use of anticoagulants: Secondary | ICD-10-CM | POA: Diagnosis not present

## 2023-03-23 DIAGNOSIS — C9000 Multiple myeloma not having achieved remission: Secondary | ICD-10-CM

## 2023-03-23 DIAGNOSIS — Z823 Family history of stroke: Secondary | ICD-10-CM | POA: Diagnosis not present

## 2023-03-23 DIAGNOSIS — Z86718 Personal history of other venous thrombosis and embolism: Secondary | ICD-10-CM | POA: Diagnosis not present

## 2023-03-23 DIAGNOSIS — M5126 Other intervertebral disc displacement, lumbar region: Secondary | ICD-10-CM | POA: Diagnosis not present

## 2023-03-23 DIAGNOSIS — Z5112 Encounter for antineoplastic immunotherapy: Secondary | ICD-10-CM | POA: Diagnosis not present

## 2023-03-23 DIAGNOSIS — Z923 Personal history of irradiation: Secondary | ICD-10-CM | POA: Diagnosis not present

## 2023-03-23 DIAGNOSIS — G4733 Obstructive sleep apnea (adult) (pediatric): Secondary | ICD-10-CM | POA: Diagnosis not present

## 2023-03-23 DIAGNOSIS — R1310 Dysphagia, unspecified: Secondary | ICD-10-CM | POA: Diagnosis not present

## 2023-03-23 DIAGNOSIS — Z7961 Long term (current) use of immunomodulator: Secondary | ICD-10-CM | POA: Diagnosis not present

## 2023-03-23 DIAGNOSIS — M533 Sacrococcygeal disorders, not elsewhere classified: Secondary | ICD-10-CM | POA: Diagnosis not present

## 2023-03-23 DIAGNOSIS — Z9089 Acquired absence of other organs: Secondary | ICD-10-CM | POA: Diagnosis not present

## 2023-03-23 DIAGNOSIS — M48061 Spinal stenosis, lumbar region without neurogenic claudication: Secondary | ICD-10-CM | POA: Diagnosis not present

## 2023-03-23 DIAGNOSIS — I10 Essential (primary) hypertension: Secondary | ICD-10-CM | POA: Diagnosis not present

## 2023-03-23 DIAGNOSIS — I480 Paroxysmal atrial fibrillation: Secondary | ICD-10-CM | POA: Diagnosis not present

## 2023-03-23 DIAGNOSIS — E876 Hypokalemia: Secondary | ICD-10-CM | POA: Diagnosis not present

## 2023-03-23 LAB — CBC WITH DIFFERENTIAL (CANCER CENTER ONLY)
Abs Immature Granulocytes: 0.04 10*3/uL (ref 0.00–0.07)
Basophils Absolute: 0 10*3/uL (ref 0.0–0.1)
Basophils Relative: 0 %
Eosinophils Absolute: 0.1 10*3/uL (ref 0.0–0.5)
Eosinophils Relative: 1 %
HCT: 35.9 % — ABNORMAL LOW (ref 39.0–52.0)
Hemoglobin: 11.8 g/dL — ABNORMAL LOW (ref 13.0–17.0)
Immature Granulocytes: 1 %
Lymphocytes Relative: 9 %
Lymphs Abs: 0.6 10*3/uL — ABNORMAL LOW (ref 0.7–4.0)
MCH: 32.4 pg (ref 26.0–34.0)
MCHC: 32.9 g/dL (ref 30.0–36.0)
MCV: 98.6 fL (ref 80.0–100.0)
Monocytes Absolute: 1 10*3/uL (ref 0.1–1.0)
Monocytes Relative: 14 %
Neutro Abs: 5.2 10*3/uL (ref 1.7–7.7)
Neutrophils Relative %: 75 %
Platelet Count: 131 10*3/uL — ABNORMAL LOW (ref 150–400)
RBC: 3.64 MIL/uL — ABNORMAL LOW (ref 4.22–5.81)
RDW: 15.8 % — ABNORMAL HIGH (ref 11.5–15.5)
WBC Count: 6.9 10*3/uL (ref 4.0–10.5)
nRBC: 0 % (ref 0.0–0.2)

## 2023-03-23 LAB — CMP (CANCER CENTER ONLY)
ALT: 13 U/L (ref 0–44)
AST: 12 U/L — ABNORMAL LOW (ref 15–41)
Albumin: 3.7 g/dL (ref 3.5–5.0)
Alkaline Phosphatase: 74 U/L (ref 38–126)
Anion gap: 6 (ref 5–15)
BUN: 29 mg/dL — ABNORMAL HIGH (ref 8–23)
CO2: 27 mmol/L (ref 22–32)
Calcium: 8.8 mg/dL — ABNORMAL LOW (ref 8.9–10.3)
Chloride: 107 mmol/L (ref 98–111)
Creatinine: 1.09 mg/dL (ref 0.61–1.24)
GFR, Estimated: >60 mL/min (ref >60)
Glucose, Bld: 127 mg/dL — ABNORMAL HIGH (ref 70–99)
Potassium: 3.8 mmol/L (ref 3.5–5.1)
Sodium: 140 mmol/L (ref 135–145)
Total Bilirubin: 0.5 mg/dL (ref <1.2)
Total Protein: 5.5 g/dL — ABNORMAL LOW (ref 6.5–8.1)

## 2023-03-23 MED ORDER — CARFILZOMIB CHEMO INJECTION 60 MG
70.0000 mg/m2 | Freq: Once | INTRAVENOUS | Status: AC
  Administered 2023-03-23: 150 mg via INTRAVENOUS
  Filled 2023-03-23: qty 60

## 2023-03-23 MED ORDER — SODIUM CHLORIDE 0.9 % IV SOLN: Freq: Once | INTRAVENOUS | Status: AC

## 2023-03-23 NOTE — Patient Instructions (Signed)
 [facility] - A DEPT OF [facility]  Discharge Instructions: Thank you for choosing [facility] to provide your oncology and hematology care.   If you have a lab appointment with the Cancer Center, please go directly to the Cancer Center and check in at the registration area.   Wear comfortable clothing and clothing appropriate for easy access to any Portacath or PICC line.   We strive to give you quality time with your provider. You may need to reschedule your appointment if you arrive late (15 or more minutes).  Arriving late affects you and other patients whose appointments are after yours.  Also, if you miss three or more appointments without notifying the office, you may be dismissed from the clinic at the provider's discretion.      For prescription refill requests, have your pharmacy contact our office and allow 72 hours for refills to be completed.    Today you received the following chemotherapy and/or immunotherapy agents: Kyprolis      To help prevent nausea and vomiting after your treatment, we encourage you to take your nausea medication as directed.  BELOW ARE SYMPTOMS THAT SHOULD BE REPORTED IMMEDIATELY: *FEVER GREATER THAN 100.4 F (38 C) OR HIGHER *CHILLS OR SWEATING *NAUSEA AND VOMITING THAT IS NOT CONTROLLED WITH YOUR NAUSEA MEDICATION *UNUSUAL SHORTNESS OF BREATH *UNUSUAL BRUISING OR BLEEDING *URINARY PROBLEMS (pain or burning when urinating, or frequent urination) *BOWEL PROBLEMS (unusual diarrhea, constipation, pain near the anus) TENDERNESS IN MOUTH AND THROAT WITH OR WITHOUT PRESENCE OF ULCERS (sore throat, sores in mouth, or a toothache) UNUSUAL RASH, SWELLING OR PAIN  UNUSUAL VAGINAL DISCHARGE OR ITCHING   Items with * indicate a potential emergency and should be followed up as soon as possible or go to the Emergency Department if any problems should occur.  Please show the CHEMOTHERAPY ALERT CARD or IMMUNOTHERAPY  ALERT CARD at check-in to the Emergency Department and triage nurse.  Should you have questions after your visit or need to cancel or reschedule your appointment, please contact [facility] - A DEPT OF [facility]  Dept: [phone]  and follow the prompts.  Office hours are 8:00 a.m. to 4:30 p.m. Monday - Friday. Please note that voicemails left after 4:00 p.m. may not be returned until the following business day.  We are closed weekends and major holidays. You have access to a nurse at all times for urgent questions. Please call the main number to the clinic Dept: [phone] and follow the prompts.   For any non-urgent questions, you may also contact your provider using MyChart. We now offer e-Visits for anyone 38 and older to request care online for non-urgent symptoms. For details visit [email or web address].   Also download the MyChart app! Go to the app store, search "MyChart", open the app, select [facility], and log in with your MyChart username and password.

## 2023-03-23 NOTE — Progress Notes (Signed)
 Patient states he took dexamethasone at home today.

## 2023-03-24 NOTE — Therapy (Signed)
OUTPATIENT PHYSICAL THERAPY NECK AND THORACIC EVALUATION   Patient Name: Travis [name] MRN: [id number] DOB:Oct 01, 1950, 72 y.o., male Today's Date: 03/25/2023  END OF SESSION:  PT End of Session - 03/25/23 0847     Visit Number 1    Date for PT Re-Evaluation 05/20/23    Authorization Type [employer] MCR    Progress Note Due on Visit 10    PT Start Time [phone]    PT Stop Time 0930    PT Time Calculation (min) 43 min    Activity Tolerance Patient tolerated treatment well    Behavior During Therapy [facility] for tasks assessed/performed             Past Medical History:  Diagnosis Date   A-fib (HCC)    Arthritis    "comes and goes; mostly in hands, occasionally elbow" (04/05/2017)   Complication of anesthesia    "just wanted to sleep alot  for couple days S/P VARICOCELE OR" (04/05/2017)   DVT (deep venous thrombosis) (HCC)    LLE   Dysrhythmia    PVCs    GERD (gastroesophageal reflux disease)    History of hiatal hernia    History of radiation therapy    Thoracic Spine- 07/13/22-07/23/22-Dr. Antony Blackbird   Hypertension    Impaired glucose tolerance    Multiple myeloma (HCC)    Obesity (BMI 30-39.9) 07/26/2016   OSA on CPAP 07/26/2016   Mild with AHI 12.5/hr now on CPAP at 14cm H2O   PAF (paroxysmal atrial fibrillation) (HCC)    s/p PVI Ablation in 2018 // Flecainide Rx // Echo 8/21: EF 60-65, no RWMA, mild LVH, normal RV SF, moderate RVE, mild BAE, trivial MR, mild dilation of aortic root (40 mm)   Pneumonia    "may have had walking pneumonia a few years ago" (04/05/2017)   Pre-diabetes    Thrombophlebitis    Past Surgical History:  Procedure Laterality Date   ATRIAL FIBRILLATION ABLATION  04/05/2017   ATRIAL FIBRILLATION ABLATION N/A 04/05/2017   Procedure: ATRIAL FIBRILLATION ABLATION;  Surgeon: [name], MD;  Location: [facility];  Service: Cardiovascular;  Laterality: N/A;   BONE BIOPSY Right 12/17/2021   Procedure: BONE BIOPSY;  Surgeon: [name], MD;   Location: [facility];  Service: Orthopedics;  Laterality: Right;   COMPLETE RIGHT HIP REPLACMENT  01/19/2019   EVALUATION UNDER ANESTHESIA WITH HEMORRHOIDECTOMY N/A 02/24/2017   Procedure: EXAM UNDER ANESTHESIA WITH  HEMORRHOIDECTOMY;  Surgeon: [name], MD;  Location: [facility];  Service: General;  Laterality: N/A;   EXCISIONAL TOTAL HIP ARTHROPLASTY WITH ANTIBIOTIC SPACERS Right 09/25/2020   Procedure: EXCISIONAL TOTAL HIP ARTHROPLASTY WITH ANTIBIOTIC SPACERS;  Surgeon: [name], MD;  Location: [facility];  Service: Orthopedics;  Laterality: Right;   FLEXIBLE SIGMOIDOSCOPY N/A 04/15/2015   Procedure:  UNSEDATED FLEXIBLE SIGMOIDOSCOPY;  Surgeon: [name], MD;  Location: [facility];  Service: Endoscopy;  Laterality: N/A;   HUMERUS IM NAIL Right 12/17/2021   Procedure: INTRAMEDULLARY (IM) NAIL HUMERAL;  Surgeon: [name], MD;  Location: [facility];  Service: Orthopedics;  Laterality: Right;   KNEE ARTHROSCOPY Left ~ 2016   POSTERIOR CERVICAL FUSION/FORAMINOTOMY N/A 06/04/2022   Procedure: Thoracic one - Thoracic Four Posterior lateral arthrodesis/ fusion and Thoracic two Corpectomy;  Surgeon: [name], MD;  Location: [facility];  Service: Neurosurgery;  Laterality: N/A;   REIMPLANTATION OF TOTAL HIP Right 12/25/2020   Procedure: REIMPLANTATION/REVISION OF RIGHT TOTAL HIP VERSUS REPEAT IRRIGATION AND  DEBRIDEMENT;  Surgeon: [name], MD;  Location: [facility];  Service: Orthopedics;  Laterality: Right;   TESTICLE SURGERY  1988   VARICOCELE   TONSILLECTOMY     VARICOSE VEIN SURGERY Bilateral    Patient Active Problem List   Diagnosis Date Noted   Pancreatic cyst 09/30/2022   Infection due to novel influenza A virus 09/29/2022   Persistent atrial fibrillation (HCC) 07/15/2022   Acute on chronic diastolic heart failure (HCC) 07/03/2022   Pulmonary infiltrates 07/01/2022   Acute respiratory failure with hypoxia (HCC) 07/01/2022   Hyponatremia  06/29/2022   History of thoracic surgery 06/29/2022   Acute DVT of left tibial vein (HCC) 06/21/2022   Hypokalemia 06/21/2022   Vitamin D deficiency 05/30/2022   Other constipation 05/29/2022   Pathologic compression fracture of spine, initial encounter (HCC) 05/29/2022   Cord compression (HCC) 05/28/2022   Degenerative cervical disc 05/14/2022   Hypercoagulable state due to persistent atrial fibrillation (HCC) 01/11/2022   Multiple myeloma (HCC) 12/10/2021   Medication monitoring encounter 02/27/2021   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   Status post peripherally inserted central catheter (PICC) central line placement 11/06/2020   Drug rash with eosinophilia and systemic symptoms syndrome 11/06/2020   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   PAF (paroxysmal atrial fibrillation) (HCC)    GERD (gastroesophageal reflux disease) 03/29/2018   Hiatal hernia 03/29/2018   History of DVT (deep vein thrombosis) 03/29/2018   CAP (community acquired pneumonia) 03/29/2018   Osteoarthritis 03/29/2018   Neuropathy 08/29/2017   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   Paroxysmal atrial fibrillation with rapid ventricular response (HCC) 04/05/2017   Prolapsed internal hemorrhoids, grade 4, s/p ligation/pexy/hemorrhoidectomy x 2 02/24/2017 02/24/2017   OSA (obstructive sleep apnea) 07/26/2016   Obesity (BMI 30-39.9) 07/26/2016   Atrial fibrillation with RVR (HCC)    Essential hypertension    Chest pain at rest     PCP: [name], MD   REFERRING PROVIDER: Jaci Standard, MD   REFERRING DIAG: C90.00 (ICD-10-CM) - Multiple myeloma not having achieved remission (HCC)   THERAPY DIAG:  Pain in thoracic spine  Cervicalgia  Other symptoms and signs involving the musculoskeletal system  Cramp and spasm  Rationale for Evaluation and Treatment: Rehabilitation  ONSET DATE: Feb 2024   SUBJECTIVE:   SUBJECTIVE STATEMENT: What I really need help with  is my shoulder muscles and upper back. I'm able to lift my head better. Stiffness and tightness in upper back. Had CT on Tuesday showed lesions on B ribs. Had chemo yesterday and pain was worse. Gets chemo weekly 3 wks on, one week off. Not effective and going to [facility] today for new regimen.   PERTINENT HISTORY: significant for IgA Kappa Multiple Myeloma. He was diagnosed with smouldering myeloma in 2018 . July 2023 he developed right arm pain due to a pathologic fx and he underwent radiation and fixation. In early Feb 2024 he had radiation for pathologic T2 compression fx followed by a T1-T4 fusion.  PAIN:  Are you having pain? Yes: NPRS scale: 3/10 Pain location: R arm and upper traps; between shoulder blades, some low back  Pain description: constant tightness.soreness Aggravating factors: not sure Relieving factors: shoulder rolls, aquatic PT. Hot tub  PRECAUTIONS: Other: Multiple Myeloma with mets to R ribs, R humerus with ORIF for pathological fracture 12/17/21. Lesions have also been found in R rib and also in his skull. Pathologic fx T2 with fusion T1-4 on 06/04/22, Bilateral LE edema (  on Lasix), Afib   RED FLAGS: None   WEIGHT BEARING RESTRICTIONS: No  FALLS:  Has patient fallen in last 6 months? Yes. Number of falls 1 walking to fast and tripped over feet  LIVING ENVIRONMENT: Lives with: lives with their family and lives with their spouse Lives in: House/apartment Stairs: Yes; Internal: 14 steps; on left going up and External: 3 steps; on right going up Has following equipment at home: Single point cane and Walker - 4 wheeled  OCCUPATION: semi-retired, attorney  PLOF: Independent with household mobility with device  PATIENT GOALS: stretch these muscles out  NEXT MD VISIT: 12/4  OBJECTIVE:  Note: Objective measures were completed at Evaluation unless otherwise noted.  DIAGNOSTIC FINDINGS: see chart  PATIENT SURVEYS:  NDI 15 / 50 = 30.0 %  COGNITION: Overall cognitive  status: Within functional limits for tasks assessed     SENSATION: Some tingling in his hands and feet   POSTURE: rounded shoulders, forward head, and increased thoracic kyphosis Sits in significant forward head, thoracic kyphosis  PALPATION: Marked tightness in B cervical paraspinals, levator, UT  CERVICAL ROM: * pain  Active ROM A/PROM (deg) eval  Flexion full  Extension 4 deg  Right lateral flexion 17  Left lateral flexion 13*  Right rotation 38  Left rotation 42   (Blank rows = not tested)   SHOULDER AROM (tested in sitting): Flex  R 102 deg, L 123 deg ABD R 88 deg, L 124 deg  Functional IR R thumb to L5, L to T10 Functional ER - can reach behind head easily L, must bend neck forward R  SHOULDER STRENGTH: Flex R 4+/5, ABD R 4/5  else 5/5 B    TODAY'S TREATMENT:                                                                                                                              DATE:   03/25/23 See pt ed and HEP   PATIENT EDUCATION:  Education details: PT eval findings, anticipated POC, initial HEP, and role of DN  Person educated: Patient Education method: Explanation, Demonstration, and Handouts Education comprehension: verbalized understanding and returned demonstration  HOME EXERCISE PROGRAM: Access Code: [email or web address] URL: [email or web address] Date: 03/25/2023 Prepared by: Raynelle Fanning  Exercises - Seated Cervical Rotation AROM  - 1 x daily - 7 x weekly - 1 sets - 10 reps - 10 hold - Seated Cervical Sidebending AROM  - 1 x daily - 7 x weekly - 1 sets - 10 reps - 5 hold - Seated Cervical Extension AROM  - 1 x daily - 7 x weekly - 1 sets - 10 reps - 10 hold - Seated Scapular Retraction  - 1 x daily - 7 x weekly - 1-3 sets - 10 reps - 2-3 sec hold - Sidelying Thoracic Rotation with Open Book  - 1 x daily - 7 x weekly - 2 sets - 5 reps - Seated Thoracic Extension Arms Overhead  -  1 x daily - 7 x weekly - 1-2 sets - 10 reps - 2-3 sec hold -  Seated Trunk Rotation  - 1 x daily - 7 x weekly - 1-2 sets - 10 reps - 2-3 sec hold - Seated Diagonal Chops with Medicine Ball  - 1 x daily - 7 x weekly - 1-2 sets - 10 reps - 2-3 hold  ASSESSMENT:  CLINICAL IMPRESSION: Patient is a 72 y.o. male who was seen today for physical therapy evaluation and treatment for upper back and neck pain s/p T-4 fusion secondary to T2 compression fx from multiple myeloma. He has marked ROM deficits in his neck, thoracic rotation and B shoulders, postural deficits and general weakness. He will benefit from skilled PT to address these deficits.    OBJECTIVE IMPAIRMENTS: decreased activity tolerance, decreased ROM, decreased strength, increased muscle spasms, impaired flexibility, impaired UE functional use, postural dysfunction, and pain.   ACTIVITY LIMITATIONS: lifting, sleeping, bathing, dressing, and reach over head  PARTICIPATION LIMITATIONS: driving and golf  PERSONAL FACTORS: Fitness, Past/current experiences, and 3+ comorbidities: multiple myeloma, previous fractures  are also affecting patient's functional outcome.   REHAB POTENTIAL: Good  CLINICAL DECISION MAKING: Evolving/moderate complexity  EVALUATION COMPLEXITY: Moderate   GOALS: Goals reviewed with patient? Yes  SHORT TERM GOALS: Target date: 04/22/2023   Patient will be independent with initial HEP.  Baseline:  Goal status: INITIAL 2.  Improved mobility in thoracic spine by 50% with exercises.  Baseline:  Goal status: INITIAL   LONG TERM GOALS: Target date: 05/20/2023   Patient will be independent with advanced/ongoing HEP to improve outcomes and carryover.  Baseline:  Goal status: INITIAL  2.  Patient will report 50% improvement in neck and upper back pain to improve QOL.  Baseline:  Goal status: INITIAL  3.  Patient will demonstrate improved cervical extension to 10 deg Baseline:  Goal status:INITIAL  4.  Patient will demonstrate improved upper back strength as  evidenced by improved sitting and standing posture. Baseline:  Goal status:INITIAL  5.  Patient will report 7/50 on NDI to demonstrate improved functional ability.  Baseline:  Goal status: INITIAL   PLAN:  PT FREQUENCY: 2x/week  PT DURATION: 8 weeks  PLANNED INTERVENTIONS: [id number]- PT Re-evaluation, 97110-Therapeutic exercises, 97530- Therapeutic activity, [id number]- Neuromuscular re-education, 97535- Self Care, [id number]- Manual therapy, [id number]- Aquatic Therapy, 97014- Electrical stimulation (unattended), Patient/Family education, Taping, Dry Needling, Cryotherapy, and Moist heat  PLAN FOR NEXT SESSION: No mobilizations due to previous fractures, work on thoracic and cervical mobility, DN/MT to upper traps/cervical, postural strength.    Solon Palm, PT  03/25/2023, 1:00 PM

## 2023-03-25 ENCOUNTER — Ambulatory Visit: Payer: Medicare Other | Attending: Hematology and Oncology | Admitting: Physical Therapy

## 2023-03-25 ENCOUNTER — Encounter: Payer: Self-pay | Admitting: Physical Therapy

## 2023-03-25 ENCOUNTER — Other Ambulatory Visit: Payer: Self-pay

## 2023-03-25 DIAGNOSIS — R29898 Other symptoms and signs involving the musculoskeletal system: Secondary | ICD-10-CM

## 2023-03-25 DIAGNOSIS — I480 Paroxysmal atrial fibrillation: Secondary | ICD-10-CM | POA: Diagnosis not present

## 2023-03-25 DIAGNOSIS — C9000 Multiple myeloma not having achieved remission: Secondary | ICD-10-CM | POA: Diagnosis not present

## 2023-03-25 DIAGNOSIS — M6281 Muscle weakness (generalized): Secondary | ICD-10-CM | POA: Insufficient documentation

## 2023-03-25 DIAGNOSIS — I10 Essential (primary) hypertension: Secondary | ICD-10-CM | POA: Diagnosis not present

## 2023-03-25 DIAGNOSIS — R2689 Other abnormalities of gait and mobility: Secondary | ICD-10-CM | POA: Insufficient documentation

## 2023-03-25 DIAGNOSIS — R252 Cramp and spasm: Secondary | ICD-10-CM

## 2023-03-25 DIAGNOSIS — C9002 Multiple myeloma in relapse: Secondary | ICD-10-CM | POA: Diagnosis not present

## 2023-03-25 DIAGNOSIS — M542 Cervicalgia: Secondary | ICD-10-CM

## 2023-03-25 DIAGNOSIS — M546 Pain in thoracic spine: Secondary | ICD-10-CM

## 2023-03-25 DIAGNOSIS — I5033 Acute on chronic diastolic (congestive) heart failure: Secondary | ICD-10-CM | POA: Diagnosis not present

## 2023-03-25 DIAGNOSIS — D472 Monoclonal gammopathy: Secondary | ICD-10-CM | POA: Diagnosis not present

## 2023-03-30 DIAGNOSIS — L570 Actinic keratosis: Secondary | ICD-10-CM | POA: Diagnosis not present

## 2023-03-30 DIAGNOSIS — Z872 Personal history of diseases of the skin and subcutaneous tissue: Secondary | ICD-10-CM | POA: Diagnosis not present

## 2023-03-30 DIAGNOSIS — L578 Other skin changes due to chronic exposure to nonionizing radiation: Secondary | ICD-10-CM | POA: Diagnosis not present

## 2023-03-30 DIAGNOSIS — L728 Other follicular cysts of the skin and subcutaneous tissue: Secondary | ICD-10-CM | POA: Diagnosis not present

## 2023-03-30 DIAGNOSIS — L814 Other melanin hyperpigmentation: Secondary | ICD-10-CM | POA: Diagnosis not present

## 2023-03-30 DIAGNOSIS — D1801 Hemangioma of skin and subcutaneous tissue: Secondary | ICD-10-CM | POA: Diagnosis not present

## 2023-03-30 DIAGNOSIS — L821 Other seborrheic keratosis: Secondary | ICD-10-CM | POA: Diagnosis not present

## 2023-04-03 ENCOUNTER — Encounter: Payer: Self-pay | Admitting: Hematology and Oncology

## 2023-04-06 ENCOUNTER — Other Ambulatory Visit: Payer: Self-pay | Admitting: Hematology and Oncology

## 2023-04-06 ENCOUNTER — Telehealth: Payer: Self-pay

## 2023-04-06 ENCOUNTER — Other Ambulatory Visit (HOSPITAL_COMMUNITY): Payer: Self-pay

## 2023-04-06 DIAGNOSIS — C9000 Multiple myeloma not having achieved remission: Secondary | ICD-10-CM

## 2023-04-06 MED ORDER — SELINEXOR (60 MG ONCE WEEKLY) 20 MG PO TBPK: 60.0000 mg | ORAL_TABLET | ORAL | 0 refills | Status: DC

## 2023-04-07 ENCOUNTER — Inpatient Hospital Stay: Payer: Medicare Other | Attending: Hematology and Oncology

## 2023-04-07 ENCOUNTER — Other Ambulatory Visit: Payer: Self-pay | Admitting: *Deleted

## 2023-04-07 ENCOUNTER — Ambulatory Visit: Payer: Medicare Other | Admitting: Hematology and Oncology

## 2023-04-07 ENCOUNTER — Inpatient Hospital Stay: Payer: Medicare Other

## 2023-04-07 ENCOUNTER — Other Ambulatory Visit: Payer: Medicare Other

## 2023-04-07 ENCOUNTER — Other Ambulatory Visit: Payer: Self-pay | Admitting: Hematology and Oncology

## 2023-04-07 ENCOUNTER — Inpatient Hospital Stay (HOSPITAL_BASED_OUTPATIENT_CLINIC_OR_DEPARTMENT_OTHER): Payer: Medicare Other | Admitting: Nurse Practitioner

## 2023-04-07 ENCOUNTER — Other Ambulatory Visit (HOSPITAL_COMMUNITY): Payer: Self-pay

## 2023-04-07 ENCOUNTER — Telehealth: Payer: Self-pay | Admitting: Pharmacy Technician

## 2023-04-07 VITALS — BP 140/56 | HR 63 | Temp 98.4°F | Resp 18 | Ht 70.0 in | Wt 265.0 lb

## 2023-04-07 DIAGNOSIS — Z5112 Encounter for antineoplastic immunotherapy: Secondary | ICD-10-CM | POA: Insufficient documentation

## 2023-04-07 DIAGNOSIS — Z7961 Long term (current) use of immunomodulator: Secondary | ICD-10-CM | POA: Diagnosis not present

## 2023-04-07 DIAGNOSIS — K7689 Other specified diseases of liver: Secondary | ICD-10-CM | POA: Insufficient documentation

## 2023-04-07 DIAGNOSIS — Z7901 Long term (current) use of anticoagulants: Secondary | ICD-10-CM | POA: Insufficient documentation

## 2023-04-07 DIAGNOSIS — N200 Calculus of kidney: Secondary | ICD-10-CM | POA: Insufficient documentation

## 2023-04-07 DIAGNOSIS — C9000 Multiple myeloma not having achieved remission: Secondary | ICD-10-CM

## 2023-04-07 DIAGNOSIS — R5383 Other fatigue: Secondary | ICD-10-CM | POA: Insufficient documentation

## 2023-04-07 DIAGNOSIS — Z79899 Other long term (current) drug therapy: Secondary | ICD-10-CM | POA: Diagnosis not present

## 2023-04-07 DIAGNOSIS — R918 Other nonspecific abnormal finding of lung field: Secondary | ICD-10-CM | POA: Insufficient documentation

## 2023-04-07 DIAGNOSIS — D472 Monoclonal gammopathy: Secondary | ICD-10-CM

## 2023-04-07 DIAGNOSIS — R222 Localized swelling, mass and lump, trunk: Secondary | ICD-10-CM | POA: Diagnosis not present

## 2023-04-07 DIAGNOSIS — M898X9 Other specified disorders of bone, unspecified site: Secondary | ICD-10-CM | POA: Diagnosis not present

## 2023-04-07 DIAGNOSIS — Z7969 Long term (current) use of other immunomodulators and immunosuppressants: Secondary | ICD-10-CM | POA: Insufficient documentation

## 2023-04-07 DIAGNOSIS — Z881 Allergy status to other antibiotic agents status: Secondary | ICD-10-CM | POA: Insufficient documentation

## 2023-04-07 DIAGNOSIS — I700 Atherosclerosis of aorta: Secondary | ICD-10-CM | POA: Insufficient documentation

## 2023-04-07 LAB — CMP (CANCER CENTER ONLY)
ALT: 20 U/L (ref 0–44)
AST: 18 U/L (ref 15–41)
Albumin: 4.2 g/dL (ref 3.5–5.0)
Alkaline Phosphatase: 74 U/L (ref 38–126)
Anion gap: 8 (ref 5–15)
BUN: 30 mg/dL — ABNORMAL HIGH (ref 8–23)
CO2: 30 mmol/L (ref 22–32)
Calcium: 9.4 mg/dL (ref 8.9–10.3)
Chloride: 102 mmol/L (ref 98–111)
Creatinine: 1.21 mg/dL (ref 0.61–1.24)
GFR, Estimated: >60 mL/min (ref >60)
Glucose, Bld: 96 mg/dL (ref 70–99)
Potassium: 3.2 mmol/L — ABNORMAL LOW (ref 3.5–5.1)
Sodium: 140 mmol/L (ref 135–145)
Total Bilirubin: 0.5 mg/dL (ref <1.2)
Total Protein: 6 g/dL — ABNORMAL LOW (ref 6.5–8.1)

## 2023-04-07 LAB — CBC WITH DIFFERENTIAL (CANCER CENTER ONLY)
Abs Immature Granulocytes: 0.02 10*3/uL (ref 0.00–0.07)
Basophils Absolute: 0 10*3/uL (ref 0.0–0.1)
Basophils Relative: 0 %
Eosinophils Absolute: 0.2 10*3/uL (ref 0.0–0.5)
Eosinophils Relative: 2 %
HCT: 36.6 % — ABNORMAL LOW (ref 39.0–52.0)
Hemoglobin: 12.2 g/dL — ABNORMAL LOW (ref 13.0–17.0)
Immature Granulocytes: 0 %
Lymphocytes Relative: 10 %
Lymphs Abs: 0.9 10*3/uL (ref 0.7–4.0)
MCH: 33.2 pg (ref 26.0–34.0)
MCHC: 33.3 g/dL (ref 30.0–36.0)
MCV: 99.7 fL (ref 80.0–100.0)
Monocytes Absolute: 1.2 10*3/uL — ABNORMAL HIGH (ref 0.1–1.0)
Monocytes Relative: 14 %
Neutro Abs: 6.4 10*3/uL (ref 1.7–7.7)
Neutrophils Relative %: 74 %
Platelet Count: 235 10*3/uL (ref 150–400)
RBC: 3.67 MIL/uL — ABNORMAL LOW (ref 4.22–5.81)
RDW: 14.5 % (ref 11.5–15.5)
WBC Count: 8.7 10*3/uL (ref 4.0–10.5)
nRBC: 0 % (ref 0.0–0.2)

## 2023-04-07 MED ORDER — POMALIDOMIDE 1 MG PO CAPS: 1.0000 mg | ORAL_CAPSULE | Freq: Every day | ORAL | 0 refills | Status: DC

## 2023-04-07 NOTE — Progress Notes (Signed)
Patient Care Team: [name], MD as PCP - General (Internal Medicine) Pricilla Riffle, MD as PCP - Cardiology (Cardiology) Quintella Reichert, MD as PCP - Sleep Medicine (Cardiology) Lanier Prude, MD as PCP - Electrophysiology (Cardiology) [name], MD as Consulting Physician (General Surgery) Vida Rigger, MD as Consulting Physician (Gastroenterology) Quintella Reichert, MD as Consulting Physician (Sleep Medicine) Glendale Chard, DO as Consulting Physician (Neurology)  Clinic Day:  04/10/2023  Referring physician: Jaci Standard, MD  ASSESSMENT & PLAN:   Assessment & Plan: Multiple myeloma (HCC) IgA Kappa Multiple Myeloma --Metastatic survey completed 12/15/2021 --Patient underwent fixation of his humerus on 12/17/2021, pathology confirmed plasmacytoma/plasma cell neoplasm.  --Received 6 cycles of Darzalex, Revlimid, and dexamethasone started on 12/28/2021-05/18/2022.  --Due to worsening myeloma osseous lesions including pathologic fracture at T2 with cord compression, Dr. Leonides Schanz recommends changing therapy to Velcade/Pomalyst/Dex.  --Due to rising kappa free light chains, recommend to change therapy to Carfilzomib/Dex started on 01/12/2023. -- completed three cycles like this and will now start new therapy    Plan: Labs reviewed  -CBC showing WBC 8.7; Hgb 12.2; Hct 36.6; MCV 99.7; plt 235; Anc 6.4 -CMP - K 3.2; glucose 96; BUN 30; Creatinine 1.21; eGFR > 60; Ca 9.4; LFTs normal.  -Kappa free light chains 523.0; lambda free light chains 2.7; kappa lambda light chain ratio 103.70 -Multiple myeloma panel is pending. -Patient to start new therapy consisting of Pomalyst and Xpovio.  He is awaiting CAR-T therapy at [facility]. Plan to keep labs and follow-up appointment with Dr. Leonides Schanz as scheduled on 04/20/2023.  He understands strict callback criteria and will notify cancer center for any unusual or bothersome symptoms.    The patient understands the  plans discussed today and is in agreement with them.  He knows to contact our office if he develops concerns prior to his next appointment.  I provided 30 minutes of face-to-face time during this encounter and > 50% was spent counseling as documented under my assessment and plan.    Carlean Jews, NP  [facility] - A DEPT OF [facility] [address] [place] [address] Dept: [phone] Dept Fax: [phone]   No orders of the defined types were placed in this encounter.     CHIEF COMPLAINT:  CC: Multiple myeloma  Current Treatment: Pomalyst and Xpovio.  Awaiting CAR-T therapy at [facility].  INTERVAL HISTORY:  Travis [name] is here today for repeat clinical assessment.  He last saw Dr. Leonides Schanz on 03/23/2023.  Recently evaluated at [facility] for CAR-T therapy.  Will be starting Pomalyst along with Xpovio while waiting CAR-T therapy treatments.  Patient reports continued bone pain in lumbar spine.  He does have some fatigue.  Uses a walking stick for mobility and assistance with ambulation.denies chest pain, He denies chest pressure, or shortness of breath. He denies headaches or visual disturbances. He denies abdominal pain, nausea, vomiting, or changes in bowel or bladder habits.   His appetite is good. His weight has increased 4 pounds over last 3 weeks .  I have reviewed the past medical history, past surgical history, social history and family history with the patient and they are unchanged from previous note.  ALLERGIES:  is allergic to linezolid and vancomycin.  MEDICATIONS:  Current Outpatient Medications  Medication Sig Dispense Refill   albuterol (VENTOLIN HFA) 108 (90 Base) MCG/ACT inhaler Inhale 2 puffs  into the lungs every 6 (six) hours as needed for wheezing or shortness of breath. 8 g 0   amiodarone (PACERONE) 200 MG tablet Take 200 mg by mouth daily.     calcium carbonate  (TUMS - DOSED IN MG ELEMENTAL CALCIUM) 500 MG chewable tablet Chew 1 tablet by mouth daily.     cholecalciferol (VITAMIN D3) 25 MCG (1000 UNIT) tablet Take 1,000 Units by mouth daily.     Cyanocobalamin (VITAMIN B-12 PO) Take 1,000 mcg by mouth daily.     dexamethasone (DECADRON) 4 MG tablet Take 5 tablets (20 mg total) by mouth every 7 (seven) days. Take 5 tabs (20 mg) by mouth once a day in the morning of chemo on Wednesdays- or unless otherwise instructed 20 tablet 3   ELIQUIS 5 MG TABS tablet TAKE ONE TABLET BY MOUTH TWICE A DAY 60 tablet 6   fluticasone (FLONASE) 50 MCG/ACT nasal spray Place 1 spray into both nostrils daily as needed for allergies or rhinitis.     gabapentin (NEURONTIN) 300 MG capsule Take 2 capsules (600 mg total) by mouth 2 (two) times daily. 360 capsule 1   hydrALAZINE (APRESOLINE) 50 MG tablet Take 1 tablet (50 mg total) by mouth 3 (three) times daily. (Patient taking differently: Take 100 mg by mouth 3 (three) times daily.) 135 tablet 3   ketoconazole (NIZORAL) 2 % cream as needed for irritation.     metolazone (ZAROXOLYN) 2.5 MG tablet Take 1 tablet (2.5 mg total) by mouth every other day. (Patient taking differently: Take 2.5 mg by mouth 3 times/day as needed-between meals & bedtime (edema). Taking 1/2 ever other day or every three days)     polyethylene glycol powder (GLYCOLAX/MIRALAX) 17 GM/SCOOP powder Take 1 g by mouth daily.     pomalidomide (POMALYST) 1 MG capsule Take 1 capsule (1 mg total) by mouth daily. [employer] Auth # [id number]  Date Obtained 04/07/23 Take 1 capsule daily for 21 days then none for 7 days 21 capsule 0   potassium chloride SA (KLOR-CON M) 20 MEQ tablet TAKE TWO TABLETS BY MOUTH ONE TIME DAILY - TAKE AN EXTRA TABLET ON THE DAY YOU TAKE THE METOLAZONE ( TOTAL) 225 tablet 3   psyllium (METAMUCIL) 58.6 % powder Take by mouth at bedtime. 3 tsp daily hs     sacubitril-valsartan (ENTRESTO) 49-51 MG Take 1 tablet by mouth 2 (two) times daily.     sodium  chloride (OCEAN) 0.65 % SOLN nasal spray Place 1 spray into both nostrils as needed for congestion.     spironolactone (ALDACTONE) 25 MG tablet Take 1 tablet (25 mg total) by mouth daily.     TART CHERRY PO Take 1 tablet by mouth at bedtime.     torsemide (DEMADEX) 20 MG tablet Take 2 tablets (40 mg total) by mouth daily. 90 tablet 3   selinexor (XPOVIO) Therapy Pack (60 mg once weekly) Take 1 tablet (60 mg total) by mouth once a week. 4 tablet 0   No current facility-administered medications for this visit.    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 Initial Diagnosis   Smoldering multiple myeloma (HCC)   09/13/2017 Tumor Marker   tProt 6.8, Alb 3.8, Ca 9.3, Cr 0.9, AP 98 LDH 184, beta-2 microglobulin 1.4 SPEP -- M-spike 0.5g/dL, SIFE -- IgA kappa IgG 519, IgA 544, IgM 29; kappa 119.3, lambda 8.5, KLR 14.04 WBC 7.6, Hgb 14.6, Plt 208   Multiple myeloma (HCC)  12/10/2021  Initial Diagnosis   Multiple myeloma (HCC)   12/28/2021 - 05/18/2022 Chemotherapy   Patient is on Treatment Plan : MYELOMA NEWLY DIAGNOSED Daratumumab IV + Lenalidomide + Dexamethasone Weekly (DaraRd) q28d     06/23/2022 - 01/05/2023 Chemotherapy   Patient is on Treatment Plan : MYELOMA  Bortezomib SQ weekly + Dexamethasone weekly + Pomalidomide q28d     01/12/2023 - 03/23/2023 Chemotherapy   Patient is on Treatment Plan : MYELOMA RELAPSED/REFRACTORY Carfilzomib (20/70) D1,8,15 + Dexamethasone weekly (40) (Kd) q28d  x 9 cycles / Dexamethasone D1,8,15         REVIEW OF SYSTEMS:   Constitutional: Denies fevers, chills or abnormal weight loss. Fatigue  Eyes: Denies blurriness of vision Ears, nose, mouth, throat, and face: Denies mucositis or sore throat Respiratory: Denies cough, dyspnea or wheezes Cardiovascular: Denies palpitation, chest discomfort or lower extremity swelling Gastrointestinal:  Denies nausea, heartburn or change in bowel habits Skin: Denies abnormal  skin rashes Lymphatics: Denies new lymphadenopathy or easy bruising Neurological:Denies numbness, tingling or new weaknesses Behavioral/Psych: Mood is stable, no new changes  All other systems were reviewed with the patient and are negative.   VITALS:   Today's Vitals   04/07/23 1427 04/07/23 1431  BP: (!) 140/56   Pulse: 63   Resp: 18   Temp: 98.4 F (36.9 C)   TempSrc: Temporal   SpO2: 96%   Weight: 265 lb (120.2 kg)   Height: 5\' 10"  (1.778 m)   PainSc:  3    Body mass index is 38.02 kg/m.   Wt Readings from Last 3 Encounters:  04/07/23 265 lb (120.2 kg)  03/23/23 261 lb 11.2 oz (118.7 kg)  03/11/23 264 lb 3.2 oz (119.8 kg)    Body mass index is 38.02 kg/m.  Performance status (ECOG): 1 - Symptomatic but completely ambulatory  PHYSICAL EXAM:   GENERAL:alert, no distress and comfortable SKIN: skin color, texture, turgor are normal, no rashes or significant lesions EYES: normal, Conjunctiva are pink and non-injected, sclera clear OROPHARYNX:no exudate, no erythema and lips, buccal mucosa, and tongue normal  NECK: supple, thyroid normal size, non-tender, without nodularity LYMPH:  no palpable lymphadenopathy in the cervical, axillary or inguinal LUNGS: clear to auscultation and percussion with normal breathing effort HEART: regular rate & rhythm and no murmurs and no lower extremity edema ABDOMEN:abdomen soft, non-tender and normal bowel sounds Musculoskeletal:no cyanosis of digits and no clubbing  NEURO: alert & oriented x 3 with fluent speech, no focal motor/sensory deficits  LABORATORY DATA:  I have reviewed the data as listed    Component Value Date/Time   NA 140 04/07/2023 1355   NA 146 (H) 02/03/2023 1509   K 3.2 (L) 04/07/2023 1355   CL 102 04/07/2023 1355   CO2 30 04/07/2023 1355   GLUCOSE 96 04/07/2023 1355   BUN 30 (H) 04/07/2023 1355   BUN 26 02/03/2023 1509   CREATININE 1.21 04/07/2023 1355   CREATININE 0.81 10/16/2020 1453   CALCIUM 9.4  04/07/2023 1355   PROT 6.0 (L) 04/07/2023 1355   PROT 5.9 (L) 05/27/2022 1200   ALBUMIN 4.2 04/07/2023 1355   ALBUMIN 4.1 04/23/2022 1241   AST 18 04/07/2023 1355   ALT 20 04/07/2023 1355   ALKPHOS 74 04/07/2023 1355   BILITOT 0.5 04/07/2023 1355   GFRNONAA >60 04/07/2023 1355   GFRAA 108 12/04/2019 1110   GFRAA >60 07/25/2019 1403    Lab Results  Component Value Date   WBC 8.7 04/07/2023   NEUTROABS 6.4 04/07/2023  HGB 12.2 (L) 04/07/2023   HCT 36.6 (L) 04/07/2023   MCV 99.7 04/07/2023   PLT 235 04/07/2023      Chemistry         RADIOGRAPHIC STUDIES: ICT CHEST ABDOMEN PELVIS W CONTRAST  Result Date: 03/23/2023 CLINICAL DATA:  Hematologic malignancy, assess treatment response. * Tracking Code: BO * EXAM: CT CHEST, ABDOMEN, AND PELVIS WITH CONTRAST TECHNIQUE: Multidetector CT imaging of the chest, abdomen and pelvis was performed following the standard protocol during bolus administration of intravenous contrast. RADIATION DOSE REDUCTION: This exam was performed according to the departmental dose-optimization program which includes automated exposure control, adjustment of the mA and/or kV according to patient size and/or use of iterative reconstruction technique. CONTRAST:  OMNIPAQUE IOHEXOL 300 MG/ML  SOLN COMPARISON:  Multiple priors including CT December 30, 2022, October 25, 2022 and Sep 29, 2022 FINDINGS: CT CHEST FINDINGS Cardiovascular: Normal caliber thoracic aorta. No central pulmonary embolus on this nondedicated study. Normal size heart. No significant pericardial effusion/thickening. Mediastinum/Nodes: No suspicious thyroid nodule. No pathologically enlarged mediastinal, hilar or axillary lymph nodes. The esophagus is grossly unremarkable. Lungs/Pleura: A few tiny ground-glass opacities in the right upper for instance on image 57/7. The extensive ground-glass opacities seen on the prior chest CT are predominantly resolved. There are few scattered areas of  atelectasis/scarring. New subpleural nodule measuring 2.6 cm extending from a lytic lesion in the posterolateral left seventh rib on image 87/7. Small subpleural nodule in the right middle lobe extending from a lytic focus in the overlying sixth rib measures 5 mm on image 120/7, new from prior examination. Musculoskeletal: Innumerable lytic and sclerotic lesion seen throughout the axial and visualized appendicular skeleton. Lesions in the spine are similar to recent CT December 30, 2022 with a few lucent lesions within the ribs which are new from more remote chest CT Sep 29, 2022. For instance in the left posterolateral seventh rib on image 87/7 and right lateral sixth rib on image 120/7. Surgical hardware in the right humerus. Posterior spinal fusion hardware. CT ABDOMEN PELVIS FINDINGS Hepatobiliary: Cluster of cysts in the inferior right hepatic lobe again seen. No suspicious hepatic lesion. Gallbladder is unremarkable. No biliary ductal dilation. Pancreas: No pancreatic ductal dilation. Two adjacent cystic lesions in the tail of the pancreas are unchanged from recent CT abdomen and pelvis with the largest measuring 3.0 x 2.5 cm on image 65/2, unchanged when remeasured for consistency. Spleen: No splenomegaly or focal splenic lesion. Adrenals/Urinary Tract: Bilateral adrenal glands appear normal. Nonobstructive bilateral renal stones measure up to 2 mm. No hydronephrosis. Subcentimeter bilateral hypodense renal lesions are technically too small to accurately characterize but statistically likely to reflect cysts. Urinary bladder is unremarkable for degree of distension. Stomach/Bowel: No radiopaque enteric contrast material was administered. Stomach is unremarkable for degree of distension. No pathologic dilation of small or large bowel. No evidence of acute bowel inflammation. Vascular/Lymphatic: Normal caliber abdominal aorta. Smooth IVC contours. Aortic atherosclerosis. The portal, splenic and superior  mesenteric veins are patent. No pathologically enlarged abdominal or pelvic lymph nodes. Reproductive: Prostate is unremarkable. Other: No significant abdominopelvic free fluid. Musculoskeletal: Innumerable lucent and sclerotic lesions throughout the axial and appendicular skeleton are overall similar to prior examination. No definite new osseous lesion identified. Right total hip arthroplasty. IMPRESSION: 1. Innumerable lytic and sclerotic lesions throughout the axial and visualized appendicular skeleton are overall similar to recent CT December 30, 2022 with a few lucent lesions within the ribs which are new from more remote chest CT  Sep 29, 2022. 2. New subpleural nodules in the left upper lobe and right middle lobe extending from lytic lesions in the overlying ribs. 3. No evidence of lymphadenopathy in the chest, abdomen or pelvis. 4. Two adjacent cystic lesions in the tail of the pancreas are unchanged from recent CT abdomen and pelvis with the largest measuring 3.0 x 2.5 cm, consider further evaluation by pre and postcontrast enhanced MRCP. 5. Nonobstructive bilateral renal stones measure up to 2 mm. 6.  Aortic Atherosclerosis (ICD10-I70.0). Electronically Signed   By: Maudry Mayhew M.D.   On: 03/23/2023 10:23

## 2023-04-07 NOTE — Assessment & Plan Note (Addendum)
IgA Kappa Multiple Myeloma --Metastatic survey completed 12/15/2021 --Patient underwent fixation of his humerus on 12/17/2021, pathology confirmed plasmacytoma/plasma cell neoplasm.  --Received 6 cycles of Darzalex, Revlimid, and dexamethasone started on 12/28/2021-05/18/2022.  --Due to worsening myeloma osseous lesions including pathologic fracture at T2 with cord compression, Dr. Leonides Schanz recommends changing therapy to Velcade/Pomalyst/Dex.  --Due to rising kappa free light chains, recommend to change therapy to Carfilzomib/Dex started on 01/12/2023. -- completed three cycles like this and will now start new therapy which consists of Pomalyst and Xpovio.  Will start next week after medications are delivered. --Will start CAR-T therapy as indicated at [facility].

## 2023-04-07 NOTE — Telephone Encounter (Signed)
Oral Oncology Patient Advocate Encounter  Prior Authorization for Murvin Natal has been approved.    PA# [id number] Effective dates: 04/07/23 through 05/02/24  Patients co-pay is $0.    Jinger Neighbors, CPhT-Adv Oncology Pharmacy Patient Advocate [facility] Direct Number: [phone]  Fax: [phone]

## 2023-04-07 NOTE — Telephone Encounter (Signed)
Oral Oncology Pharmacist Encounter  Received new prescription for Pomalyst (pomalidomide) and Xpovio (selinexor) for the treatment of multiple myeloma in conjunction with dexamethasone, planned duration until disease progression .  Labs from 03/25/23 assessed, no dose adjustments needed for xpovio or pomalyst. Patients platelet count, hemoglobin, and ANC will need to be monitored. Xpovio and pomalyst prescription doses and frequencies have been assessed. RN to add the pomalyst prescription for pomalyst 1mg  by mouth daily for 21 days on, 7 days off.   Current medication list in Epic reviewed, DDIs with Pomalyst identified: - gabapentin (Cat C): both medications are considered CNS depressants and may increase the adverse effects of other cns depressants. Patient will be made aware to monitor for any increased side effects.  Current medication list in Epic reviewed, no significant/ relevant DDIs with Xpovio identified.   Evaluated chart and no patient barriers to medication adherence noted.   Prescription has been e-scribed to the [facility] for benefits analysis and approval.  Oral Oncology Clinic will continue to follow for insurance authorization, copayment issues, initial counseling and start date.  [name], PharmD Hematology/Oncology Clinical Pharmacist [facility] Oral Chemotherapy Navigation Clinic [phone] 04/07/2023 8:49 AM

## 2023-04-07 NOTE — Telephone Encounter (Signed)
Oral Oncology Patient Advocate Encounter   Received notification that prior authorization for Xpovio is required.   PA submitted on 04/07/23 Key [id number] Status is pending     Jinger Neighbors, CPhT-Adv Oncology Pharmacy Patient Advocate [facility] Direct Number: [phone]  Fax: [phone]

## 2023-04-08 ENCOUNTER — Inpatient Hospital Stay: Payer: Medicare Other

## 2023-04-08 ENCOUNTER — Telehealth: Payer: Self-pay | Admitting: *Deleted

## 2023-04-08 LAB — KAPPA/LAMBDA LIGHT CHAINS
Kappa free light chain: 523 mg/L — ABNORMAL HIGH (ref 3.3–19.4)
Kappa, lambda light chain ratio: 193.7 — ABNORMAL HIGH (ref 0.26–1.65)
Lambda free light chains: 2.7 mg/L — ABNORMAL LOW (ref 5.7–26.3)

## 2023-04-08 MED ORDER — SELINEXOR (60 MG ONCE WEEKLY) 60 MG PO TBPK: 60.0000 mg | ORAL_TABLET | ORAL | 0 refills | Status: DC

## 2023-04-08 NOTE — Telephone Encounter (Signed)
TCT patient regarding a lab appt for today. Spoke with him. Advised that we have received the lab orders from Dr. Evlyn Courier office and that we can have him come in today at 3pm. Pt said he would be able to  come in.  Lab appt has been made

## 2023-04-10 ENCOUNTER — Encounter: Payer: Self-pay | Admitting: Nurse Practitioner

## 2023-04-10 ENCOUNTER — Encounter: Payer: Self-pay | Admitting: Hematology and Oncology

## 2023-04-11 NOTE — Telephone Encounter (Signed)
Oral Chemotherapy Pharmacist Encounter  Patient Education I spoke with patient for overview of new oral chemotherapy medication: Xpovio (selinexor) in conjunction with Pomalyst (pomalidomide) and dexamethasone for the treatment of multiple myeloma, planned duration until disease progression or unacceptable drug toxicity.   Pt is doing well. Counseled patient on administration, dosing, side effects, monitoring, drug-food interactions, safe handling, storage, and disposal.  Patient will take Xpovio (selinexor) 60 mg tablets, 1 tablet (60 total daily dose) by mouth once a week. Patient will take Pomalyst (pomalidomide) 1mg  capsules, 1 capsule (1mg ) by mouth daily for 21 days on, 7 days off. Repeat every 28 days.  Patient will take dexamethasone 20mg  by mouth once weekly with the Xpovio.  Patient will take Zofran 30 minutes before taking Xpovio. Patient has zofran and compazine on hand.  Xpovio/ Pomalyst/ dexamethasone start date: pomalyst started on 04/09/23 and dexamethasone and xpovio started on 04/13/2023  Side effects include but not limited to: nausea/vomiting, decreased blood counts, decreased appetite, fatigue, diarrhea, changes in electrolytes, and dizziness/mental status changes  Reviewed with patient importance of keeping a medication schedule and plan for any missed doses.  After discussion with patient there are no patient barriers to medication adherence identified.   Patient voiced understanding and appreciation. All questions answered. Medication handout provided.  Provided patient with Oral Chemotherapy Navigation Clinic phone number. Patient knows to call the office with questions or concerns. Oral Chemotherapy Navigation Clinic will continue to follow.  [name], PharmD Hematology/Oncology Clinical Pharmacist [facility] Oral Chemotherapy Navigation Clinic [phone] 04/11/2023 10:27 AM

## 2023-04-11 NOTE — Therapy (Signed)
OUTPATIENT PHYSICAL THERAPY NECK AND THORACIC TREATMENT   Patient Name: Travis [name] MRN: [id number] DOB:Jul 03, 1950, 72 y.o., male Today's Date: 04/11/2023  END OF SESSION:    Past Medical History:  Diagnosis Date   A-fib [facility])    Arthritis    "comes and goes; mostly in hands, occasionally elbow" (04/05/2017)   Complication of anesthesia    "just wanted to sleep alot  for couple days S/P VARICOCELE OR" (04/05/2017)   DVT (deep venous thrombosis) (HCC)    LLE   Dysrhythmia    PVCs    GERD (gastroesophageal reflux disease)    History of hiatal hernia    History of radiation therapy    Thoracic Spine- 07/13/22-07/23/22-Dr. Antony Blackbird   Hypertension    Impaired glucose tolerance    Multiple myeloma (HCC)    Obesity (BMI 30-39.9) 07/26/2016   OSA on CPAP 07/26/2016   Mild with AHI 12.5/hr now on CPAP at 14cm H2O   PAF (paroxysmal atrial fibrillation) (HCC)    s/p PVI Ablation in 2018 // Flecainide Rx // Echo 8/21: EF 60-65, no RWMA, mild LVH, normal RV SF, moderate RVE, mild BAE, trivial MR, mild dilation of aortic root (40 mm)   Pneumonia    "may have had walking pneumonia a few years ago" (04/05/2017)   Pre-diabetes    Thrombophlebitis    Past Surgical History:  Procedure Laterality Date   ATRIAL FIBRILLATION ABLATION  04/05/2017   ATRIAL FIBRILLATION ABLATION N/A 04/05/2017   Procedure: ATRIAL FIBRILLATION ABLATION;  Surgeon: [name], MD;  Location: [facility];  Service: Cardiovascular;  Laterality: N/A;   BONE BIOPSY Right 12/17/2021   Procedure: BONE BIOPSY;  Surgeon: [name], MD;  Location: [facility];  Service: Orthopedics;  Laterality: Right;   COMPLETE RIGHT HIP REPLACMENT  01/19/2019   EVALUATION UNDER ANESTHESIA WITH HEMORRHOIDECTOMY N/A 02/24/2017   Procedure: EXAM UNDER ANESTHESIA WITH  HEMORRHOIDECTOMY;  Surgeon: [name], MD;  Location: [facility];  Service: General;  Laterality: N/A;   EXCISIONAL TOTAL HIP ARTHROPLASTY  WITH ANTIBIOTIC SPACERS Right 09/25/2020   Procedure: EXCISIONAL TOTAL HIP ARTHROPLASTY WITH ANTIBIOTIC SPACERS;  Surgeon: [name], MD;  Location: [facility];  Service: Orthopedics;  Laterality: Right;   FLEXIBLE SIGMOIDOSCOPY N/A 04/15/2015   Procedure:  UNSEDATED FLEXIBLE SIGMOIDOSCOPY;  Surgeon: [name], MD;  Location: [facility];  Service: Endoscopy;  Laterality: N/A;   HUMERUS IM NAIL Right 12/17/2021   Procedure: INTRAMEDULLARY (IM) NAIL HUMERAL;  Surgeon: [name], MD;  Location: [facility];  Service: Orthopedics;  Laterality: Right;   KNEE ARTHROSCOPY Left ~ 2016   POSTERIOR CERVICAL FUSION/FORAMINOTOMY N/A 06/04/2022   Procedure: Thoracic one - Thoracic Four Posterior lateral arthrodesis/ fusion and Thoracic two Corpectomy;  Surgeon: [name], MD;  Location: [facility];  Service: Neurosurgery;  Laterality: N/A;   REIMPLANTATION OF TOTAL HIP Right 12/25/2020   Procedure: REIMPLANTATION/REVISION OF RIGHT TOTAL HIP VERSUS REPEAT IRRIGATION AND DEBRIDEMENT;  Surgeon: [name], MD;  Location: [facility];  Service: Orthopedics;  Laterality: Right;   TESTICLE SURGERY  1988   VARICOCELE   TONSILLECTOMY     VARICOSE VEIN SURGERY Bilateral    Patient Active Problem List   Diagnosis Date Noted   Pancreatic cyst 09/30/2022   Infection due to novel influenza A virus 09/29/2022   Persistent atrial fibrillation (HCC) 07/15/2022   Acute on chronic diastolic heart failure (HCC) 07/03/2022   Pulmonary infiltrates 07/01/2022   Acute respiratory failure  with hypoxia (HCC) 07/01/2022   Hyponatremia 06/29/2022   History of thoracic surgery 06/29/2022   Acute DVT of left tibial vein (HCC) 06/21/2022   Hypokalemia 06/21/2022   Vitamin D deficiency 05/30/2022   Other constipation 05/29/2022   Pathologic compression fracture of spine, initial encounter (HCC) 05/29/2022   Cord compression (HCC) 05/28/2022   Degenerative cervical disc 05/14/2022   Hypercoagulable state due  to persistent atrial fibrillation (HCC) 01/11/2022   Multiple myeloma (HCC) 12/10/2021   Medication monitoring encounter 02/27/2021   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   Status post peripherally inserted central catheter (PICC) central line placement 11/06/2020   Drug rash with eosinophilia and systemic symptoms syndrome 11/06/2020   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   PAF (paroxysmal atrial fibrillation) (HCC)    GERD (gastroesophageal reflux disease) 03/29/2018   Hiatal hernia 03/29/2018   History of DVT (deep vein thrombosis) 03/29/2018   CAP (community acquired pneumonia) 03/29/2018   Osteoarthritis 03/29/2018   Neuropathy 08/29/2017   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  Rationale for Evaluation and Treatment: Rehabilitation  ONSET DATE: Feb 2024   SUBJECTIVE:   SUBJECTIVE STATEMENT: *** What I really need help with is my shoulder muscles and upper back. I'm able to lift my head better. Stiffness and tightness in upper back. Had CT on Tuesday showed lesions on B ribs. Had chemo yesterday and pain was worse. Gets chemo weekly 3 wks on, one week off. Not effective and going to [facility] today for new regimen.   PERTINENT HISTORY: significant for IgA Kappa Multiple Myeloma. He was diagnosed with smouldering myeloma in 2018 . July 2023 he developed right arm pain due to a pathologic fx and he  underwent radiation and fixation. In early Feb 2024 he had radiation for pathologic T2 compression fx followed by a T1-T4 fusion.  PAIN:  Are you having pain? Yes: NPRS scale: 3/10 Pain location: R arm and upper traps; between shoulder blades, some low back  Pain description: constant tightness.soreness Aggravating factors: not sure Relieving factors: shoulder rolls, aquatic PT. Hot tub  PRECAUTIONS: Other: Multiple Myeloma with mets to R ribs, R humerus with ORIF for pathological fracture 12/17/21. Lesions have also been found in R rib and also in his skull. Pathologic fx T2 with fusion T1-4 on 06/04/22, Bilateral LE edema (on Lasix), Afib   RED FLAGS: None   WEIGHT BEARING RESTRICTIONS: No  FALLS:  Has patient fallen in last 6 months? Yes. Number of falls 1 walking to fast and tripped over feet  LIVING ENVIRONMENT: Lives with: lives with their family and lives with their spouse Lives in: House/apartment Stairs: Yes; Internal: 14 steps; on left going up and External: 3 steps; on right going up Has following equipment at home: Single point cane and Walker - 4 wheeled  OCCUPATION: semi-retired, attorney  PLOF: Independent with household mobility with device  PATIENT GOALS: stretch these muscles out  NEXT MD  VISIT: 12/4  OBJECTIVE:  Note: Objective measures were completed at Evaluation unless otherwise noted.  DIAGNOSTIC FINDINGS: see chart  PATIENT SURVEYS:  NDI 15 / 50 = 30.0 %  COGNITION: Overall cognitive status: Within functional limits for tasks assessed     SENSATION: Some tingling in his hands and feet   POSTURE: rounded shoulders, forward head, and increased thoracic kyphosis Sits in significant forward head, thoracic kyphosis  PALPATION: Marked tightness in B cervical paraspinals, levator, UT  CERVICAL ROM: * pain  Active ROM A/PROM (deg) eval  Flexion full  Extension 4 deg  Right lateral flexion 17  Left lateral flexion 13*  Right rotation 38  Left  rotation 42   (Blank rows = not tested)   SHOULDER AROM (tested in sitting): Flex  R 102 deg, L 123 deg ABD R 88 deg, L 124 deg  Functional IR R thumb to L5, L to T10 Functional ER - can reach behind head easily L, must bend neck forward R  SHOULDER STRENGTH: Flex R 4+/5, ABD R 4/5  else 5/5 B    TODAY'S TREATMENT:                                                                                                                              DATE:   04/12/23   Seated neck rotation Seated neck SB Seated cervical ext Seated scapular retraction  03/25/23 See pt ed and HEP   PATIENT EDUCATION:  Education details: PT eval findings, anticipated POC, initial HEP, and role of DN  Person educated: Patient Education method: Explanation, Demonstration, and Handouts Education comprehension: verbalized understanding and returned demonstration  HOME EXERCISE PROGRAM: Access Code: [email or web address] URL: [email or web address] Date: 03/25/2023 Prepared by: Raynelle Fanning  Exercises - Seated Cervical Rotation AROM  - 1 x daily - 7 x weekly - 1 sets - 10 reps - 10 hold - Seated Cervical Sidebending AROM  - 1 x daily - 7 x weekly - 1 sets - 10 reps - 5 hold - Seated Cervical Extension AROM  - 1 x daily - 7 x weekly - 1 sets - 10 reps - 10 hold - Seated Scapular Retraction  - 1 x daily - 7 x weekly - 1-3 sets - 10 reps - 2-3 sec hold - Sidelying Thoracic Rotation with Open Book  - 1 x daily - 7 x weekly - 2 sets - 5 reps - Seated Thoracic Extension Arms Overhead  - 1 x daily - 7 x weekly - 1-2 sets - 10 reps - 2-3 sec hold - Seated Trunk Rotation  - 1 x daily - 7 x weekly - 1-2 sets - 10 reps - 2-3 sec hold - Seated Diagonal Chops with Medicine Ball  - 1 x daily - 7 x weekly - 1-2 sets - 10 reps - 2-3 hold  ASSESSMENT:  CLINICAL IMPRESSION: ***  OBJECTIVE IMPAIRMENTS: decreased activity tolerance, decreased ROM, decreased strength, increased muscle spasms, impaired flexibility, impaired  UE  functional use, postural dysfunction, and pain.   ACTIVITY LIMITATIONS: lifting, sleeping, bathing, dressing, and reach over head  PARTICIPATION LIMITATIONS: driving and golf  PERSONAL FACTORS: Fitness, Past/current experiences, and 3+ comorbidities: multiple myeloma, previous fractures  are also affecting patient's functional outcome.   REHAB POTENTIAL: Good  CLINICAL DECISION MAKING: Evolving/moderate complexity  EVALUATION COMPLEXITY: Moderate   GOALS: Goals reviewed with patient? Yes  SHORT TERM GOALS: Target date: 04/22/2023   Patient will be independent with initial HEP.  Baseline:  Goal status: INITIAL 2.  Improved mobility in thoracic spine by 50% with exercises.  Baseline:  Goal status: INITIAL   LONG TERM GOALS: Target date: 05/20/2023   Patient will be independent with advanced/ongoing HEP to improve outcomes and carryover.  Baseline:  Goal status: INITIAL  2.  Patient will report 50% improvement in neck and upper back pain to improve QOL.  Baseline:  Goal status: INITIAL  3.  Patient will demonstrate improved cervical extension to 10 deg Baseline:  Goal status:INITIAL  4.  Patient will demonstrate improved upper back strength as evidenced by improved sitting and standing posture. Baseline:  Goal status:INITIAL  5.  Patient will report 7/50 on NDI to demonstrate improved functional ability.  Baseline:  Goal status: INITIAL   PLAN:  PT FREQUENCY: 2x/week  PT DURATION: 8 weeks  PLANNED INTERVENTIONS: 97164- PT Re-evaluation, 97110-Therapeutic exercises, 97530- Therapeutic activity, [id number]- Neuromuscular re-education, 97535- Self Care, [id number]- Manual therapy, [id number]- Aquatic Therapy, 97014- Electrical stimulation (unattended), Patient/Family education, Taping, Dry Needling, Cryotherapy, and Moist heat  PLAN FOR NEXT SESSION: No mobilizations due to previous fractures, work on thoracic and cervical mobility, DN/MT to upper traps/cervical, postural  strength.    Solon Palm, PT  04/11/2023, 8:05 AM

## 2023-04-11 NOTE — Progress Notes (Incomplete Revision)
Oral Chemotherapy Pharmacist Encounter  Patient Education I spoke with patient for overview of new oral chemotherapy medication: Xpovio (selinexor) in conjunction with Pomalyst (pomalidomide) and dexamethasone for the treatment of multiple myeloma, planned duration until disease progression or unacceptable drug toxicity.   Pt is doing well. Counseled patient on administration, dosing, side effects, monitoring, drug-food interactions, safe handling, storage, and disposal.  Patient will take Xpovio (selinexor) 60 mg tablets, 1 tablet (60 total daily dose) by mouth once a week. Patient will take Pomalyst (pomalidomide) 1mg  capsules, 1 capsule (1mg ) by mouth daily for 21 days on, 7 days off. Repeat every 28 days.  Patient will take dexamethasone 20mg  by mouth once weekly with the Xpovio.  Patient will take Zofran 30 minutes before taking Xpovio.   Xpovio/ Pomalyst/ dexamethasone start date: 04/14/2023  Side effects include but not limited to: nausea/vomiting, decreased blood counts, decreased appetite, fatigue, diarrhea, changes in electrolytes, and dizziness/mental status changes  Reviewed with patient importance of keeping a medication schedule and plan for any missed doses.  After discussion with patient there are no patient barriers to medication adherence identified.   Patient voiced understanding and appreciation. All questions answered. Medication handout provided.  Provided patient with Oral Chemotherapy Navigation Clinic phone number. Patient knows to call the office with questions or concerns. Oral Chemotherapy Navigation Clinic will continue to follow.  [name], PharmD Hematology/Oncology Clinical Pharmacist [facility] Oral Chemotherapy Navigation Clinic [phone] 04/11/2023 10:27 AM

## 2023-04-12 ENCOUNTER — Ambulatory Visit: Payer: Medicare Other | Attending: Hematology and Oncology | Admitting: Physical Therapy

## 2023-04-12 ENCOUNTER — Encounter: Payer: Self-pay | Admitting: Physical Therapy

## 2023-04-12 DIAGNOSIS — M546 Pain in thoracic spine: Secondary | ICD-10-CM | POA: Insufficient documentation

## 2023-04-12 DIAGNOSIS — S22000A Wedge compression fracture of unspecified thoracic vertebra, initial encounter for closed fracture: Secondary | ICD-10-CM | POA: Insufficient documentation

## 2023-04-12 DIAGNOSIS — R252 Cramp and spasm: Secondary | ICD-10-CM | POA: Insufficient documentation

## 2023-04-12 DIAGNOSIS — R29898 Other symptoms and signs involving the musculoskeletal system: Secondary | ICD-10-CM | POA: Diagnosis not present

## 2023-04-12 DIAGNOSIS — M6281 Muscle weakness (generalized): Secondary | ICD-10-CM | POA: Diagnosis not present

## 2023-04-12 DIAGNOSIS — R293 Abnormal posture: Secondary | ICD-10-CM | POA: Diagnosis not present

## 2023-04-12 DIAGNOSIS — C9000 Multiple myeloma not having achieved remission: Secondary | ICD-10-CM | POA: Diagnosis not present

## 2023-04-12 DIAGNOSIS — M542 Cervicalgia: Secondary | ICD-10-CM | POA: Insufficient documentation

## 2023-04-12 DIAGNOSIS — R262 Difficulty in walking, not elsewhere classified: Secondary | ICD-10-CM | POA: Diagnosis not present

## 2023-04-13 ENCOUNTER — Telehealth: Payer: Self-pay | Admitting: *Deleted

## 2023-04-13 ENCOUNTER — Inpatient Hospital Stay: Payer: Medicare Other

## 2023-04-13 LAB — MULTIPLE MYELOMA PANEL, SERUM
Albumin SerPl Elph-Mcnc: 3.7 g/dL (ref 2.9–4.4)
Albumin/Glob SerPl: 2 — ABNORMAL HIGH (ref 0.7–1.7)
Alpha 1: 0.2 g/dL (ref 0.0–0.4)
Alpha2 Glob SerPl Elph-Mcnc: 0.6 g/dL (ref 0.4–1.0)
B-Globulin SerPl Elph-Mcnc: 0.9 g/dL (ref 0.7–1.3)
Gamma Glob SerPl Elph-Mcnc: 0.1 g/dL — ABNORMAL LOW (ref 0.4–1.8)
Globulin, Total: 1.9 g/dL — ABNORMAL LOW (ref 2.2–3.9)
IgA: 8 mg/dL — ABNORMAL LOW (ref 61–437)
IgG (Immunoglobin G), Serum: 181 mg/dL — ABNORMAL LOW (ref 603–1613)
IgM (Immunoglobulin M), Srm: 8 mg/dL — ABNORMAL LOW (ref 15–143)
Total Protein ELP: 5.6 g/dL — ABNORMAL LOW (ref 6.0–8.5)

## 2023-04-13 NOTE — Telephone Encounter (Signed)
TCT patient to check with him-make sure he has what he needs for his new treatment plan. Spoke with him . He states his new chemo pill should be arriving today and he will start it today.  He has the pomalyst and the dexamethasone and nausea meds. Bill did say he twisted his hip earlier in the week and is having pain in the hip which is s/p replacement. Advised he call his orthopedist to get it looked at . Pt voiced understanding.

## 2023-04-14 ENCOUNTER — Other Ambulatory Visit: Payer: Self-pay | Admitting: *Deleted

## 2023-04-14 ENCOUNTER — Other Ambulatory Visit: Payer: Self-pay | Admitting: Hematology and Oncology

## 2023-04-14 ENCOUNTER — Encounter: Payer: Medicare Other | Admitting: Physical Therapy

## 2023-04-14 ENCOUNTER — Ambulatory Visit (HOSPITAL_COMMUNITY)
Admission: RE | Admit: 2023-04-14 | Discharge: 2023-04-14 | Disposition: A | Discharge status: Home / Self Care | Payer: Medicare Other | Source: Ambulatory Visit | Attending: Hematology and Oncology | Admitting: Hematology and Oncology

## 2023-04-14 DIAGNOSIS — C9000 Multiple myeloma not having achieved remission: Secondary | ICD-10-CM

## 2023-04-14 DIAGNOSIS — M25551 Pain in right hip: Secondary | ICD-10-CM | POA: Diagnosis not present

## 2023-04-14 DIAGNOSIS — Z96641 Presence of right artificial hip joint: Secondary | ICD-10-CM | POA: Diagnosis not present

## 2023-04-18 ENCOUNTER — Telehealth: Payer: Self-pay | Admitting: *Deleted

## 2023-04-18 ENCOUNTER — Other Ambulatory Visit: Payer: Self-pay | Admitting: *Deleted

## 2023-04-18 NOTE — Telephone Encounter (Signed)
TCT Dr. Evlyn Courier office to inquire about details for potential Car-T therapy for his Multiple Myeloma. Spoke with Triage and advised that Dr. Leonides Schanz needs to know time line for this pt's Car-T therapy and when pt should stop his current therapy of Pomalyst and  Selinexor. Pt states that he believes his T-cell collection will be 05/06/23 Advised triage that we need more specifics as it relates to his treatment here. Advised that the coordinator for Car-T , loren Yetta Barre will be one to call back with more specifics. Provided this RN's direct phone # of [phone] for her to use to call back.

## 2023-04-18 NOTE — Telephone Encounter (Signed)
Received call back from Druscilla Brownie, RN, Car-T navigator. She states that Mr. Miura will have his T Cells harvested on 05/06/23. He needs to stop his current treatment regime by 04/29/23. Then it takes 6-8 weeks to develop the T cells. Pt can resume his pomalyst and selinexor on 05/09/23. He will continue this treatment until closer to his actual treatment at [facility] which is anticipated to be 06/16/23. Minerva Ends confirmed that she will keep in touch with [place] as to the exact date in February and let us know when to stop his treatment prior to that date.  TCT patient with the above information.  Dr. Leonides Schanz made aware.

## 2023-04-19 ENCOUNTER — Ambulatory Visit: Payer: Medicare Other

## 2023-04-19 ENCOUNTER — Other Ambulatory Visit: Payer: Self-pay | Admitting: *Deleted

## 2023-04-19 ENCOUNTER — Telehealth: Payer: Self-pay | Admitting: *Deleted

## 2023-04-19 DIAGNOSIS — R262 Difficulty in walking, not elsewhere classified: Secondary | ICD-10-CM | POA: Diagnosis not present

## 2023-04-19 DIAGNOSIS — R293 Abnormal posture: Secondary | ICD-10-CM | POA: Diagnosis not present

## 2023-04-19 DIAGNOSIS — C9000 Multiple myeloma not having achieved remission: Secondary | ICD-10-CM

## 2023-04-19 DIAGNOSIS — R29898 Other symptoms and signs involving the musculoskeletal system: Secondary | ICD-10-CM | POA: Diagnosis not present

## 2023-04-19 DIAGNOSIS — M6281 Muscle weakness (generalized): Secondary | ICD-10-CM

## 2023-04-19 DIAGNOSIS — M546 Pain in thoracic spine: Secondary | ICD-10-CM | POA: Diagnosis not present

## 2023-04-19 DIAGNOSIS — R252 Cramp and spasm: Secondary | ICD-10-CM

## 2023-04-19 DIAGNOSIS — M542 Cervicalgia: Secondary | ICD-10-CM

## 2023-04-19 DIAGNOSIS — S22000A Wedge compression fracture of unspecified thoracic vertebra, initial encounter for closed fracture: Secondary | ICD-10-CM | POA: Diagnosis not present

## 2023-04-19 NOTE — Therapy (Signed)
OUTPATIENT PHYSICAL THERAPY NECK AND THORACIC TREATMENT   Patient Name: Travis [name] MRN: [id number] DOB:May 21, 1950, 72 y.o., male Today's Date: 04/19/2023  END OF SESSION:  PT End of Session - 04/19/23 0947     Visit Number 3    Date for PT Re-Evaluation 05/20/23    Authorization Type [employer] MCR    Progress Note Due on Visit 10    PT Start Time 0935    PT Stop Time 1030    PT Time Calculation (min) 55 min    Activity Tolerance Patient tolerated treatment well    Behavior During Therapy WFL for tasks assessed/performed              Past Medical History:  Diagnosis Date   A-fib (HCC)    Arthritis    "comes and goes; mostly in hands, occasionally elbow" (04/05/2017)   Complication of anesthesia    "just wanted to sleep alot  for couple days S/P VARICOCELE OR" (04/05/2017)   DVT (deep venous thrombosis) (HCC)    LLE   Dysrhythmia    PVCs    GERD (gastroesophageal reflux disease)    History of hiatal hernia    History of radiation therapy    Thoracic Spine- 07/13/22-07/23/22-Dr. Antony Blackbird   Hypertension    Impaired glucose tolerance    Multiple myeloma (HCC)    Obesity (BMI 30-39.9) 07/26/2016   OSA on CPAP 07/26/2016   Mild with AHI 12.5/hr now on CPAP at 14cm H2O   PAF (paroxysmal atrial fibrillation) (HCC)    s/p PVI Ablation in 2018 // Flecainide Rx // Echo 8/21: EF 60-65, no RWMA, mild LVH, normal RV SF, moderate RVE, mild BAE, trivial MR, mild dilation of aortic root (40 mm)   Pneumonia    "may have had walking pneumonia a few years ago" (04/05/2017)   Pre-diabetes    Thrombophlebitis    Past Surgical History:  Procedure Laterality Date   ATRIAL FIBRILLATION ABLATION  04/05/2017   ATRIAL FIBRILLATION ABLATION N/A 04/05/2017   Procedure: ATRIAL FIBRILLATION ABLATION;  Surgeon: [name], MD;  Location: [facility];  Service: Cardiovascular;  Laterality: N/A;   BONE BIOPSY Right 12/17/2021   Procedure: BONE BIOPSY;  Surgeon: [name], MD;   Location: [facility];  Service: Orthopedics;  Laterality: Right;   COMPLETE RIGHT HIP REPLACMENT  01/19/2019   EVALUATION UNDER ANESTHESIA WITH HEMORRHOIDECTOMY N/A 02/24/2017   Procedure: EXAM UNDER ANESTHESIA WITH  HEMORRHOIDECTOMY;  Surgeon: [name], MD;  Location: [facility];  Service: General;  Laterality: N/A;   EXCISIONAL TOTAL HIP ARTHROPLASTY WITH ANTIBIOTIC SPACERS Right 09/25/2020   Procedure: EXCISIONAL TOTAL HIP ARTHROPLASTY WITH ANTIBIOTIC SPACERS;  Surgeon: [name], MD;  Location: [facility];  Service: Orthopedics;  Laterality: Right;   FLEXIBLE SIGMOIDOSCOPY N/A 04/15/2015   Procedure:  UNSEDATED FLEXIBLE SIGMOIDOSCOPY;  Surgeon: [name], MD;  Location: [facility];  Service: Endoscopy;  Laterality: N/A;   HUMERUS IM NAIL Right 12/17/2021   Procedure: INTRAMEDULLARY (IM) NAIL HUMERAL;  Surgeon: [name], MD;  Location: [facility];  Service: Orthopedics;  Laterality: Right;   KNEE ARTHROSCOPY Left ~ 2016   POSTERIOR CERVICAL FUSION/FORAMINOTOMY N/A 06/04/2022   Procedure: Thoracic one - Thoracic Four Posterior lateral arthrodesis/ fusion and Thoracic two Corpectomy;  Surgeon: [name], MD;  Location: [facility];  Service: Neurosurgery;  Laterality: N/A;   REIMPLANTATION OF TOTAL HIP Right 12/25/2020   Procedure: REIMPLANTATION/REVISION OF RIGHT TOTAL HIP VERSUS REPEAT IRRIGATION  AND DEBRIDEMENT;  Surgeon: [name], MD;  Location: [facility];  Service: Orthopedics;  Laterality: Right;   TESTICLE SURGERY  1988   VARICOCELE   TONSILLECTOMY     VARICOSE VEIN SURGERY Bilateral    Patient Active Problem List   Diagnosis Date Noted   Pancreatic cyst 09/30/2022   Infection due to novel influenza A virus 09/29/2022   Persistent atrial fibrillation (HCC) 07/15/2022   Acute on chronic diastolic heart failure (HCC) 07/03/2022   Pulmonary infiltrates 07/01/2022   Acute respiratory failure with hypoxia (HCC) 07/01/2022   Hyponatremia  06/29/2022   History of thoracic surgery 06/29/2022   Acute DVT of left tibial vein (HCC) 06/21/2022   Hypokalemia 06/21/2022   Vitamin D deficiency 05/30/2022   Other constipation 05/29/2022   Pathologic compression fracture of spine, initial encounter (HCC) 05/29/2022   Cord compression (HCC) 05/28/2022   Degenerative cervical disc 05/14/2022   Hypercoagulable state due to persistent atrial fibrillation (HCC) 01/11/2022   Multiple myeloma (HCC) 12/10/2021   Medication monitoring encounter 02/27/2021   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   Status post peripherally inserted central catheter (PICC) central line placement 11/06/2020   Drug rash with eosinophilia and systemic symptoms syndrome 11/06/2020   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   PAF (paroxysmal atrial fibrillation) (HCC)    GERD (gastroesophageal reflux disease) 03/29/2018   Hiatal hernia 03/29/2018   History of DVT (deep vein thrombosis) 03/29/2018   CAP (community acquired pneumonia) 03/29/2018   Osteoarthritis 03/29/2018   Neuropathy 08/29/2017   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   Paroxysmal atrial fibrillation with rapid ventricular response (HCC) 04/05/2017   Prolapsed internal hemorrhoids, grade 4, s/p ligation/pexy/hemorrhoidectomy x 2 02/24/2017 02/24/2017   OSA (obstructive sleep apnea) 07/26/2016   Obesity (BMI 30-39.9) 07/26/2016   Atrial fibrillation with RVR (HCC)    Essential hypertension    Chest pain at rest     PCP: [name], MD   REFERRING PROVIDER: Jaci Standard, MD   REFERRING DIAG: C90.00 (ICD-10-CM) - Multiple myeloma not having achieved remission (HCC)   THERAPY DIAG:  Pain in thoracic spine  Cervicalgia  Difficulty in walking, not elsewhere classified  Abnormal posture  Cramp and spasm  Muscle weakness (generalized)  Multiple myeloma not having achieved remission (HCC)  Other symptoms and signs involving the musculoskeletal  system  Neck pain  Rationale for Evaluation and Treatment: Rehabilitation  ONSET DATE: Feb 2024   SUBJECTIVE:   SUBJECTIVE STATEMENT: I am in bad shape with my hip and right low back.  Really struggling to get around and using having to use my walker.    PERTINENT HISTORY: significant for IgA Kappa Multiple Myeloma. He was diagnosed with smouldering myeloma in 2018 . July 2023 he developed right arm pain due to a pathologic fx and he underwent radiation and fixation. In early Feb 2024 he had radiation for pathologic T2 compression fx followed by a T1-T4 fusion.  PAIN:  Are you having pain? Yes: NPRS scale: 7/10 Pain location: R arm and upper traps; between shoulder blades, some low back  Pain description: constant tightness.soreness Aggravating factors: not sure Relieving factors: shoulder rolls, aquatic PT. Hot tub  PRECAUTIONS: Other: Multiple Myeloma with mets to R ribs, R humerus with ORIF for pathological fracture 12/17/21. Lesions have also been found in R rib and also in his skull. Pathologic fx T2 with fusion T1-4 on 06/04/22, Bilateral LE edema (on Lasix), Afib   RED FLAGS: None  WEIGHT BEARING RESTRICTIONS: No  FALLS:  Has patient fallen in last 6 months? Yes. Number of falls 1 walking to fast and tripped over feet  LIVING ENVIRONMENT: Lives with: lives with their family and lives with their spouse Lives in: House/apartment Stairs: Yes; Internal: 14 steps; on left going up and External: 3 steps; on right going up Has following equipment at home: Single point cane and Walker - 4 wheeled  OCCUPATION: semi-retired, attorney  PLOF: Independent with household mobility with device  PATIENT GOALS: stretch these muscles out  NEXT MD VISIT: 12/4  OBJECTIVE:  Note: Objective measures were completed at Evaluation unless otherwise noted.  DIAGNOSTIC FINDINGS: see chart  PATIENT SURVEYS:  NDI 15 / 50 = 30.0 %  COGNITION: Overall cognitive status: Within functional  limits for tasks assessed     SENSATION: Some tingling in his hands and feet   POSTURE: rounded shoulders, forward head, and increased thoracic kyphosis Sits in significant forward head, thoracic kyphosis  PALPATION: Marked tightness in B cervical paraspinals, levator, UT  CERVICAL ROM: * pain  Active ROM A/PROM (deg) eval  Flexion full  Extension 4 deg  Right lateral flexion 17  Left lateral flexion 13*  Right rotation 38  Left rotation 42   (Blank rows = not tested)   SHOULDER AROM (tested in sitting): Flex  R 102 deg, L 123 deg ABD R 88 deg, L 124 deg  Functional IR R thumb to L5, L to T10 Functional ER - can reach behind head easily L, must bend neck forward R  SHOULDER STRENGTH: Flex R 4+/5, ABD R 4/5  else 5/5 B    TODAY'S TREATMENT:                                                                                                                              DATE:   04/19/23 Nustep x 8 min level 3 Seated hamstring stretch 3 x 20 sec each LE Seated gentle piriformis stretch using strap 3 x 20 sec each LE Supine with propping techniques for upper body and legs: initially attempted self stretching with strap but patient unable to do effectively:  did all manually for right hamstring, hip abductors and hip adductors x 15 min Supine knee to chest with min assist 2 x 10 Supine heel slides with towel under heel 2 x 10 Supine hip abduction with towel under heel 2 x 10 Worked on transfers and safety with bed mobility Seated in chair with ice to right hip x 10 min to reduce pain  04/12/23   Assessed R hip - tight glutes and piriformis Supine hip IR/ER stretch Seated neck rotation after DN/MT Seated scapular retraction Manual: Skilled palpation and monitoring of soft tissues during DN. STM to B UT and cervical paraspinals.  Trigger Point Dry-Needling  Treatment instructions: Expect mild to moderate muscle soreness. S/S of pneumothorax if dry needled over a lung field,  and to seek immediate medical attention  should they occur. Patient verbalized understanding of these instructions and education. Patient Consent Given: Yes Education handout provided: Previously provided Muscles treated: B UT Electrical stimulation performed: No Parameters: N/A Treatment response/outcome: Twitch Response Elicited and Palpable Increase in Muscle Length   03/25/23 See pt ed and HEP   PATIENT EDUCATION:  Education details: PT eval findings, anticipated POC, initial HEP, and role of DN  Person educated: Patient Education method: Explanation, Demonstration, and Handouts Education comprehension: verbalized understanding and returned demonstration  HOME EXERCISE PROGRAM: Access Code: [email or web address] URL: [email or web address] Date: 03/25/2023 Prepared by: Raynelle Fanning  Exercises - Seated Cervical Rotation AROM  - 1 x daily - 7 x weekly - 1 sets - 10 reps - 10 hold - Seated Cervical Sidebending AROM  - 1 x daily - 7 x weekly - 1 sets - 10 reps - 5 hold - Seated Cervical Extension AROM  - 1 x daily - 7 x weekly - 1 sets - 10 reps - 10 hold - Seated Scapular Retraction  - 1 x daily - 7 x weekly - 1-3 sets - 10 reps - 2-3 sec hold - Sidelying Thoracic Rotation with Open Book  - 1 x daily - 7 x weekly - 2 sets - 5 reps - Seated Thoracic Extension Arms Overhead  - 1 x daily - 7 x weekly - 1-2 sets - 10 reps - 2-3 sec hold - Seated Trunk Rotation  - 1 x daily - 7 x weekly - 1-2 sets - 10 reps - 2-3 sec hold - Seated Diagonal Chops with Medicine Ball  - 1 x daily - 7 x weekly - 1-2 sets - 10 reps - 2-3 hold  ASSESSMENT:  CLINICAL IMPRESSION: Annette Stable is still moving very slowly with regard to his hip.  This was severely limiting functionally to his current spinal issues, therefore we focused on loosening the hip to allow for his upper back and neck to be less guarded.  This allows [place] to continue to work toward his current goals.  He was able to tolerate gentle stretching.  We did  not want to be overly aggressive on the stretches due to his current risk for bone mets.  He is well motivated and works hard to complete tasks.   Patient responded well to ice at end of session reporting decrease in pain upon leaving.   He would benefit from continuing skilled PT for postural strengthening and functional mobility.    OBJECTIVE IMPAIRMENTS: decreased activity tolerance, decreased ROM, decreased strength, increased muscle spasms, impaired flexibility, impaired UE functional use, postural dysfunction, and pain.   ACTIVITY LIMITATIONS: lifting, sleeping, bathing, dressing, and reach over head  PARTICIPATION LIMITATIONS: driving and golf  PERSONAL FACTORS: Fitness, Past/current experiences, and 3+ comorbidities: multiple myeloma, previous fractures  are also affecting patient's functional outcome.   REHAB POTENTIAL: Good  CLINICAL DECISION MAKING: Evolving/moderate complexity  EVALUATION COMPLEXITY: Moderate   GOALS: Goals reviewed with patient? Yes  SHORT TERM GOALS: Target date: 04/22/2023   Patient will be independent with initial HEP.  Baseline:  Goal status: INITIAL 2.  Improved mobility in thoracic spine by 50% with exercises.  Baseline:  Goal status: INITIAL   LONG TERM GOALS: Target date: 05/20/2023   Patient will be independent with advanced/ongoing HEP to improve outcomes and carryover.  Baseline:  Goal status: INITIAL  2.  Patient will report 50% improvement in neck and upper back pain to improve QOL.  Baseline:  Goal status: INITIAL  3.  Patient will demonstrate improved  cervical extension to 10 deg Baseline:  Goal status:INITIAL  4.  Patient will demonstrate improved upper back strength as evidenced by improved sitting and standing posture. Baseline:  Goal status:INITIAL  5.  Patient will report 7/50 on NDI to demonstrate improved functional ability.  Baseline:  Goal status: INITIAL   PLAN:  PT FREQUENCY: 2x/week  PT DURATION: 8  weeks  PLANNED INTERVENTIONS: 97164- PT Re-evaluation, 97110-Therapeutic exercises, 97530- Therapeutic activity, [id number]- Neuromuscular re-education, 97535- Self Care, [id number]- Manual therapy, [phone]- Aquatic Therapy, 97014- Electrical stimulation (unattended), Patient/Family education, Taping, Dry Needling, Cryotherapy, and Moist heat  PLAN FOR NEXT SESSION: No mobilizations due to previous fractures, assess right hip status, work on thoracic and cervical mobility, assess and continue with DN/MT to upper traps/cervical, postural strength. Check on status of hip.   Victorino Dike B. Litzi Binning, PT 04/19/23 11:37 AM [facility] [address], [address] [place], [place] [address] Phone # [phone] Fax [phone]

## 2023-04-19 NOTE — Telephone Encounter (Signed)
Contacted by Wilburt Finlay, CAR-T coordinator with [facility] regarding patient labs needed. Her CB#[phone] and fax# [phone] Patient needs specific labs ordered as part of CAR-T assessment and she will fax labs to this office for patient to have done here at [facility]. Fax received Information given to Dr. Leonides Schanz. He reviewed labs and gave verbal order for them. Labs ordered.

## 2023-04-20 ENCOUNTER — Inpatient Hospital Stay: Payer: Medicare Other

## 2023-04-20 ENCOUNTER — Other Ambulatory Visit: Payer: Self-pay | Admitting: Hematology and Oncology

## 2023-04-20 ENCOUNTER — Ambulatory Visit: Payer: Medicare Other

## 2023-04-20 ENCOUNTER — Inpatient Hospital Stay: Payer: Medicare Other | Admitting: Hematology and Oncology

## 2023-04-20 ENCOUNTER — Encounter: Payer: Self-pay | Admitting: Hematology and Oncology

## 2023-04-20 DIAGNOSIS — R222 Localized swelling, mass and lump, trunk: Secondary | ICD-10-CM | POA: Diagnosis not present

## 2023-04-20 DIAGNOSIS — N200 Calculus of kidney: Secondary | ICD-10-CM | POA: Diagnosis not present

## 2023-04-20 DIAGNOSIS — M898X9 Other specified disorders of bone, unspecified site: Secondary | ICD-10-CM | POA: Diagnosis not present

## 2023-04-20 DIAGNOSIS — R5383 Other fatigue: Secondary | ICD-10-CM | POA: Diagnosis not present

## 2023-04-20 DIAGNOSIS — I700 Atherosclerosis of aorta: Secondary | ICD-10-CM | POA: Diagnosis not present

## 2023-04-20 DIAGNOSIS — Z5112 Encounter for antineoplastic immunotherapy: Secondary | ICD-10-CM | POA: Diagnosis not present

## 2023-04-20 DIAGNOSIS — Z881 Allergy status to other antibiotic agents status: Secondary | ICD-10-CM | POA: Diagnosis not present

## 2023-04-20 DIAGNOSIS — Z114 Encounter for screening for human immunodeficiency virus [HIV]: Secondary | ICD-10-CM

## 2023-04-20 DIAGNOSIS — C9000 Multiple myeloma not having achieved remission: Secondary | ICD-10-CM | POA: Diagnosis not present

## 2023-04-20 DIAGNOSIS — Z7961 Long term (current) use of immunomodulator: Secondary | ICD-10-CM | POA: Diagnosis not present

## 2023-04-20 DIAGNOSIS — Z7969 Long term (current) use of other immunomodulators and immunosuppressants: Secondary | ICD-10-CM | POA: Diagnosis not present

## 2023-04-20 DIAGNOSIS — K7689 Other specified diseases of liver: Secondary | ICD-10-CM | POA: Diagnosis not present

## 2023-04-20 DIAGNOSIS — Z7901 Long term (current) use of anticoagulants: Secondary | ICD-10-CM | POA: Diagnosis not present

## 2023-04-20 DIAGNOSIS — Z79899 Other long term (current) drug therapy: Secondary | ICD-10-CM | POA: Diagnosis not present

## 2023-04-20 DIAGNOSIS — R918 Other nonspecific abnormal finding of lung field: Secondary | ICD-10-CM | POA: Diagnosis not present

## 2023-04-20 LAB — LACTATE DEHYDROGENASE: LDH: 169 U/L (ref 98–192)

## 2023-04-20 LAB — CMP (CANCER CENTER ONLY)
ALT: 19 U/L (ref 0–44)
AST: 16 U/L (ref 15–41)
Albumin: 4.3 g/dL (ref 3.5–5.0)
Alkaline Phosphatase: 73 U/L (ref 38–126)
Anion gap: 7 (ref 5–15)
BUN: 24 mg/dL — ABNORMAL HIGH (ref 8–23)
CO2: 29 mmol/L (ref 22–32)
Calcium: 8.9 mg/dL (ref 8.9–10.3)
Chloride: 104 mmol/L (ref 98–111)
Creatinine: 1.11 mg/dL (ref 0.61–1.24)
GFR, Estimated: >60 mL/min (ref >60)
Glucose, Bld: 93 mg/dL (ref 70–99)
Potassium: 3.6 mmol/L (ref 3.5–5.1)
Sodium: 140 mmol/L (ref 135–145)
Total Bilirubin: 0.4 mg/dL (ref <1.2)
Total Protein: 6 g/dL — ABNORMAL LOW (ref 6.5–8.1)

## 2023-04-20 LAB — CBC WITH DIFFERENTIAL (CANCER CENTER ONLY)
Abs Immature Granulocytes: 0.02 10*3/uL (ref 0.00–0.07)
Basophils Absolute: 0 10*3/uL (ref 0.0–0.1)
Basophils Relative: 0 %
Eosinophils Absolute: 0.1 10*3/uL (ref 0.0–0.5)
Eosinophils Relative: 1 %
HCT: 35.8 % — ABNORMAL LOW (ref 39.0–52.0)
Hemoglobin: 12.1 g/dL — ABNORMAL LOW (ref 13.0–17.0)
Immature Granulocytes: 0 %
Lymphocytes Relative: 9 %
Lymphs Abs: 0.7 10*3/uL (ref 0.7–4.0)
MCH: 34.2 pg — ABNORMAL HIGH (ref 26.0–34.0)
MCHC: 33.8 g/dL (ref 30.0–36.0)
MCV: 101.1 fL — ABNORMAL HIGH (ref 80.0–100.0)
Monocytes Absolute: 1.7 10*3/uL — ABNORMAL HIGH (ref 0.1–1.0)
Monocytes Relative: 22 %
Neutro Abs: 5.4 10*3/uL (ref 1.7–7.7)
Neutrophils Relative %: 68 %
Platelet Count: 165 10*3/uL (ref 150–400)
RBC: 3.54 MIL/uL — ABNORMAL LOW (ref 4.22–5.81)
RDW: 13 % (ref 11.5–15.5)
WBC Count: 7.9 10*3/uL (ref 4.0–10.5)
nRBC: 0 % (ref 0.0–0.2)

## 2023-04-20 LAB — HEPATITIS B CORE ANTIBODY, TOTAL: Hep B Core Total Ab: NONREACTIVE

## 2023-04-20 LAB — HEPATITIS B SURFACE ANTIGEN: Hepatitis B Surface Ag: NONREACTIVE

## 2023-04-20 LAB — HIV ANTIBODY (ROUTINE TESTING W REFLEX): HIV Screen 4th Generation wRfx: NONREACTIVE

## 2023-04-21 ENCOUNTER — Ambulatory Visit: Payer: Medicare Other

## 2023-04-21 DIAGNOSIS — R252 Cramp and spasm: Secondary | ICD-10-CM

## 2023-04-21 DIAGNOSIS — R293 Abnormal posture: Secondary | ICD-10-CM

## 2023-04-21 DIAGNOSIS — S22000A Wedge compression fracture of unspecified thoracic vertebra, initial encounter for closed fracture: Secondary | ICD-10-CM | POA: Diagnosis not present

## 2023-04-21 DIAGNOSIS — C9000 Multiple myeloma not having achieved remission: Secondary | ICD-10-CM | POA: Diagnosis not present

## 2023-04-21 DIAGNOSIS — M542 Cervicalgia: Secondary | ICD-10-CM | POA: Diagnosis not present

## 2023-04-21 DIAGNOSIS — R262 Difficulty in walking, not elsewhere classified: Secondary | ICD-10-CM | POA: Diagnosis not present

## 2023-04-21 DIAGNOSIS — M6281 Muscle weakness (generalized): Secondary | ICD-10-CM | POA: Diagnosis not present

## 2023-04-21 DIAGNOSIS — M546 Pain in thoracic spine: Secondary | ICD-10-CM

## 2023-04-21 DIAGNOSIS — R29898 Other symptoms and signs involving the musculoskeletal system: Secondary | ICD-10-CM | POA: Diagnosis not present

## 2023-04-21 LAB — KAPPA/LAMBDA LIGHT CHAINS
Kappa free light chain: 390.9 mg/L — ABNORMAL HIGH (ref 3.3–19.4)
Kappa, lambda light chain ratio: 47.1 — ABNORMAL HIGH (ref 0.26–1.65)
Lambda free light chains: 8.3 mg/L (ref 5.7–26.3)

## 2023-04-21 LAB — HCV INTERPRETATION

## 2023-04-21 LAB — HCV AB W REFLEX TO QUANT PCR: HCV Ab: NONREACTIVE

## 2023-04-21 NOTE — Therapy (Signed)
OUTPATIENT PHYSICAL THERAPY NECK AND THORACIC TREATMENT   Patient Name: Travis [name] MRN: [id number] DOB:1950/12/08, 72 y.o., male Today's Date: 04/21/2023  END OF SESSION:  PT End of Session - 04/21/23 1037     Visit Number 4    Date for PT Re-Evaluation 05/20/23    Authorization Type [employer] MCR    Progress Note Due on Visit 10    PT Start Time 1020    PT Stop Time 1100    PT Time Calculation (min) 40 min    Activity Tolerance Patient tolerated treatment well    Behavior During Therapy WFL for tasks assessed/performed              Past Medical History:  Diagnosis Date   A-fib (HCC)    Arthritis    "comes and goes; mostly in hands, occasionally elbow" (04/05/2017)   Complication of anesthesia    "just wanted to sleep alot  for couple days S/P VARICOCELE OR" (04/05/2017)   DVT (deep venous thrombosis) (HCC)    LLE   Dysrhythmia    PVCs    GERD (gastroesophageal reflux disease)    History of hiatal hernia    History of radiation therapy    Thoracic Spine- 07/13/22-07/23/22-Dr. Antony Blackbird   Hypertension    Impaired glucose tolerance    Multiple myeloma (HCC)    Obesity (BMI 30-39.9) 07/26/2016   OSA on CPAP 07/26/2016   Mild with AHI 12.5/hr now on CPAP at 14cm H2O   PAF (paroxysmal atrial fibrillation) (HCC)    s/p PVI Ablation in 2018 // Flecainide Rx // Echo 8/21: EF 60-65, no RWMA, mild LVH, normal RV SF, moderate RVE, mild BAE, trivial MR, mild dilation of aortic root (40 mm)   Pneumonia    "may have had walking pneumonia a few years ago" (04/05/2017)   Pre-diabetes    Thrombophlebitis    Past Surgical History:  Procedure Laterality Date   ATRIAL FIBRILLATION ABLATION  04/05/2017   ATRIAL FIBRILLATION ABLATION N/A 04/05/2017   Procedure: ATRIAL FIBRILLATION ABLATION;  Surgeon: [name], MD;  Location: [facility];  Service: Cardiovascular;  Laterality: N/A;   BONE BIOPSY Right 12/17/2021   Procedure: BONE BIOPSY;  Surgeon: [name], MD;   Location: [facility];  Service: Orthopedics;  Laterality: Right;   COMPLETE RIGHT HIP REPLACMENT  01/19/2019   EVALUATION UNDER ANESTHESIA WITH HEMORRHOIDECTOMY N/A 02/24/2017   Procedure: EXAM UNDER ANESTHESIA WITH  HEMORRHOIDECTOMY;  Surgeon: [name], MD;  Location: [facility];  Service: General;  Laterality: N/A;   EXCISIONAL TOTAL HIP ARTHROPLASTY WITH ANTIBIOTIC SPACERS Right 09/25/2020   Procedure: EXCISIONAL TOTAL HIP ARTHROPLASTY WITH ANTIBIOTIC SPACERS;  Surgeon: [name], MD;  Location: [facility];  Service: Orthopedics;  Laterality: Right;   FLEXIBLE SIGMOIDOSCOPY N/A 04/15/2015   Procedure:  UNSEDATED FLEXIBLE SIGMOIDOSCOPY;  Surgeon: [name], MD;  Location: [facility];  Service: Endoscopy;  Laterality: N/A;   HUMERUS IM NAIL Right 12/17/2021   Procedure: INTRAMEDULLARY (IM) NAIL HUMERAL;  Surgeon: [name], MD;  Location: [facility];  Service: Orthopedics;  Laterality: Right;   KNEE ARTHROSCOPY Left ~ 2016   POSTERIOR CERVICAL FUSION/FORAMINOTOMY N/A 06/04/2022   Procedure: Thoracic one - Thoracic Four Posterior lateral arthrodesis/ fusion and Thoracic two Corpectomy;  Surgeon: [name], MD;  Location: [facility];  Service: Neurosurgery;  Laterality: N/A;   REIMPLANTATION OF TOTAL HIP Right 12/25/2020   Procedure: REIMPLANTATION/REVISION OF RIGHT TOTAL HIP VERSUS REPEAT IRRIGATION  AND DEBRIDEMENT;  Surgeon: [name], MD;  Location: [facility];  Service: Orthopedics;  Laterality: Right;   TESTICLE SURGERY  1988   VARICOCELE   TONSILLECTOMY     VARICOSE VEIN SURGERY Bilateral    Patient Active Problem List   Diagnosis Date Noted   Pancreatic cyst 09/30/2022   Infection due to novel influenza A virus 09/29/2022   Persistent atrial fibrillation (HCC) 07/15/2022   Acute on chronic diastolic heart failure (HCC) 07/03/2022   Pulmonary infiltrates 07/01/2022   Acute respiratory failure with hypoxia (HCC) 07/01/2022   Hyponatremia  06/29/2022   History of thoracic surgery 06/29/2022   Acute DVT of left tibial vein (HCC) 06/21/2022   Hypokalemia 06/21/2022   Vitamin D deficiency 05/30/2022   Other constipation 05/29/2022   Pathologic compression fracture of spine, initial encounter (HCC) 05/29/2022   Cord compression (HCC) 05/28/2022   Degenerative cervical disc 05/14/2022   Hypercoagulable state due to persistent atrial fibrillation (HCC) 01/11/2022   Multiple myeloma (HCC) 12/10/2021   Medication monitoring encounter 02/27/2021   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   Status post peripherally inserted central catheter (PICC) central line placement 11/06/2020   Drug rash with eosinophilia and systemic symptoms syndrome 11/06/2020   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   PAF (paroxysmal atrial fibrillation) (HCC)    GERD (gastroesophageal reflux disease) 03/29/2018   Hiatal hernia 03/29/2018   History of DVT (deep vein thrombosis) 03/29/2018   CAP (community acquired pneumonia) 03/29/2018   Osteoarthritis 03/29/2018   Neuropathy 08/29/2017   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   Paroxysmal atrial fibrillation with rapid ventricular response (HCC) 04/05/2017   Prolapsed internal hemorrhoids, grade 4, s/p ligation/pexy/hemorrhoidectomy x 2 02/24/2017 02/24/2017   OSA (obstructive sleep apnea) 07/26/2016   Obesity (BMI 30-39.9) 07/26/2016   Atrial fibrillation with RVR (HCC)    Essential hypertension    Chest pain at rest     PCP: [name], MD   REFERRING PROVIDER: Jaci Standard, MD   REFERRING DIAG: C90.00 (ICD-10-CM) - Multiple myeloma not having achieved remission (HCC)   THERAPY DIAG:  Muscle weakness (generalized)  Pain in thoracic spine  Cramp and spasm  Difficulty in walking, not elsewhere classified  Abnormal posture  Compression fracture of thoracic vertebra, unspecified thoracic vertebral level, initial encounter [facility])  Rationale for  Evaluation and Treatment: Rehabilitation  ONSET DATE: Feb 2024   SUBJECTIVE:   SUBJECTIVE STATEMENT: Patient reports he is a little better.  Still on the walker.  "I did lower the walker handles like we talked about"    PERTINENT HISTORY: significant for IgA Kappa Multiple Myeloma. He was diagnosed with smouldering myeloma in 2018 . July 2023 he developed right arm pain due to a pathologic fx and he underwent radiation and fixation. In early Feb 2024 he had radiation for pathologic T2 compression fx followed by a T1-T4 fusion.  PAIN:  Are you having pain? Yes: NPRS scale: 7/10 Pain location: R arm and upper traps; between shoulder blades, some low back  Pain description: constant tightness.soreness Aggravating factors: not sure Relieving factors: shoulder rolls, aquatic PT. Hot tub  PRECAUTIONS: Other: Multiple Myeloma with mets to R ribs, R humerus with ORIF for pathological fracture 12/17/21. Lesions have also been found in R rib and also in his skull. Pathologic fx T2 with fusion T1-4 on 06/04/22, Bilateral LE edema (on Lasix), Afib   RED FLAGS: None   WEIGHT BEARING RESTRICTIONS: No  FALLS:  Has patient fallen  in last 6 months? Yes. Number of falls 1 walking to fast and tripped over feet  LIVING ENVIRONMENT: Lives with: lives with their family and lives with their spouse Lives in: House/apartment Stairs: Yes; Internal: 14 steps; on left going up and External: 3 steps; on right going up Has following equipment at home: Single point cane and Walker - 4 wheeled  OCCUPATION: semi-retired, attorney  PLOF: Independent with household mobility with device  PATIENT GOALS: stretch these muscles out  NEXT MD VISIT: 12/4  OBJECTIVE:  Note: Objective measures were completed at Evaluation unless otherwise noted.  DIAGNOSTIC FINDINGS: see chart  PATIENT SURVEYS:  NDI 15 / 50 = 30.0 %  COGNITION: Overall cognitive status: Within functional limits for tasks  assessed     SENSATION: Some tingling in his hands and feet   POSTURE: rounded shoulders, forward head, and increased thoracic kyphosis Sits in significant forward head, thoracic kyphosis  PALPATION: Marked tightness in B cervical paraspinals, levator, UT  CERVICAL ROM: * pain  Active ROM A/PROM (deg) eval  Flexion full  Extension 4 deg  Right lateral flexion 17  Left lateral flexion 13*  Right rotation 38  Left rotation 42   (Blank rows = not tested)   SHOULDER AROM (tested in sitting): Flex  R 102 deg, L 123 deg ABD R 88 deg, L 124 deg  Functional IR R thumb to L5, L to T10 Functional ER - can reach behind head easily L, must bend neck forward R  SHOULDER STRENGTH: Flex R 4+/5, ABD R 4/5  else 5/5 B    TODAY'S TREATMENT:                                                                                                                              DATE:   04/21/23 Nustep x 5 min level 3 Seated hamstring stretch 3 x 20 sec each LE (with foot on foot stool) Seated gentle piriformis stretch using strap 3 x 30 sec each LE Seated clam with red loop x 20 Seated bilateral shoulder ER with green tband x 20 Seated bilateral shoulder horizontal abduction with green tband x 20 Seated bilateral shoulder rows with green tband x 20 Seated bilateral shoulder extension with green tband x 20 Educated patient on importance of postural elongation and avoiding shoulder elevation, keeping eyes up, walking for exercise   04/19/23 Nustep x 8 min level 3 Seated hamstring stretch 3 x 20 sec each LE Seated gentle piriformis stretch using strap 3 x 20 sec each LE Supine with propping techniques for upper body and legs: initially attempted self stretching with strap but patient unable to do effectively:  did all manually for right hamstring, hip abductors and hip adductors x 15 min Supine knee to chest with min assist 2 x 10 Supine heel slides with towel under heel 2 x 10 Supine hip  abduction with towel under heel 2 x 10 Worked on transfers and safety with  bed mobility Seated in chair with ice to right hip x 10 min to reduce pain  04/12/23   Assessed R hip - tight glutes and piriformis Supine hip IR/ER stretch Seated neck rotation after DN/MT Seated scapular retraction Manual: Skilled palpation and monitoring of soft tissues during DN. STM to B UT and cervical paraspinals.  Trigger Point Dry-Needling  Treatment instructions: Expect mild to moderate muscle soreness. S/S of pneumothorax if dry needled over a lung field, and to seek immediate medical attention should they occur. Patient verbalized understanding of these instructions and education. Patient Consent Given: Yes Education handout provided: Previously provided Muscles treated: B UT Electrical stimulation performed: No Parameters: N/A Treatment response/outcome: Twitch Response Elicited and Palpable Increase in Muscle Length   03/25/23 See pt ed and HEP   PATIENT EDUCATION:  Education details: PT eval findings, anticipated POC, initial HEP, and role of DN  Person educated: Patient Education method: Explanation, Demonstration, and Handouts Education comprehension: verbalized understanding and returned demonstration  HOME EXERCISE PROGRAM: Access Code: [email or web address] URL: [email or web address] Date: 03/25/2023 Prepared by: Raynelle Fanning  Exercises - Seated Cervical Rotation AROM  - 1 x daily - 7 x weekly - 1 sets - 10 reps - 10 hold - Seated Cervical Sidebending AROM  - 1 x daily - 7 x weekly - 1 sets - 10 reps - 5 hold - Seated Cervical Extension AROM  - 1 x daily - 7 x weekly - 1 sets - 10 reps - 10 hold - Seated Scapular Retraction  - 1 x daily - 7 x weekly - 1-3 sets - 10 reps - 2-3 sec hold - Sidelying Thoracic Rotation with Open Book  - 1 x daily - 7 x weekly - 2 sets - 5 reps - Seated Thoracic Extension Arms Overhead  - 1 x daily - 7 x weekly - 1-2 sets - 10 reps - 2-3 sec hold - Seated Trunk  Rotation  - 1 x daily - 7 x weekly - 1-2 sets - 10 reps - 2-3 sec hold - Seated Diagonal Chops with Medicine Ball  - 1 x daily - 7 x weekly - 1-2 sets - 10 reps - 2-3 hold  ASSESSMENT:  CLINICAL IMPRESSION: Bill responded well to last session.  He is moving with increased gait speed but is still struggling with transfers.  We re-directed some treatment to postural strengthening and thoracic stretching.  He is compliant and well motivated.   He would benefit from continuing skilled PT for postural strengthening and functional mobility.    OBJECTIVE IMPAIRMENTS: decreased activity tolerance, decreased ROM, decreased strength, increased muscle spasms, impaired flexibility, impaired UE functional use, postural dysfunction, and pain.   ACTIVITY LIMITATIONS: lifting, sleeping, bathing, dressing, and reach over head  PARTICIPATION LIMITATIONS: driving and golf  PERSONAL FACTORS: Fitness, Past/current experiences, and 3+ comorbidities: multiple myeloma, previous fractures  are also affecting patient's functional outcome.   REHAB POTENTIAL: Good  CLINICAL DECISION MAKING: Evolving/moderate complexity  EVALUATION COMPLEXITY: Moderate   GOALS: Goals reviewed with patient? Yes  SHORT TERM GOALS: Target date: 04/22/2023   Patient will be independent with initial HEP.  Baseline:  Goal status: INITIAL 2.  Improved mobility in thoracic spine by 50% with exercises.  Baseline:  Goal status: INITIAL   LONG TERM GOALS: Target date: 05/20/2023   Patient will be independent with advanced/ongoing HEP to improve outcomes and carryover.  Baseline:  Goal status: INITIAL  2.  Patient will report 50% improvement in neck and upper back pain to  improve QOL.  Baseline:  Goal status: INITIAL  3.  Patient will demonstrate improved cervical extension to 10 deg Baseline:  Goal status:INITIAL  4.  Patient will demonstrate improved upper back strength as evidenced by improved sitting and standing  posture. Baseline:  Goal status:INITIAL  5.  Patient will report 7/50 on NDI to demonstrate improved functional ability.  Baseline:  Goal status: INITIAL   PLAN:  PT FREQUENCY: 2x/week  PT DURATION: 8 weeks  PLANNED INTERVENTIONS: 97164- PT Re-evaluation, 97110-Therapeutic exercises, 97530- Therapeutic activity, [id number]- Neuromuscular re-education, 97535- Self Care, 16109- Manual therapy, [phone]- Aquatic Therapy, 97014- Electrical stimulation (unattended), Patient/Family education, Taping, Dry Needling, Cryotherapy, and Moist heat  PLAN FOR NEXT SESSION: No mobilizations due to previous fractures, assess right hip status, work on thoracic and cervical mobility, assess and continue with DN/MT to upper traps/cervical, postural strength. Check on status of hip.   Victorino Dike B. [place] Jon, PT 04/21/23 11:25 AM [facility] [address], [address] [place], [place] [address] Phone # [phone] Fax [phone]

## 2023-04-26 ENCOUNTER — Other Ambulatory Visit: Payer: Self-pay

## 2023-04-26 ENCOUNTER — Telehealth: Payer: Self-pay

## 2023-04-26 ENCOUNTER — Telehealth: Payer: Self-pay | Admitting: Internal Medicine

## 2023-04-26 NOTE — Telephone Encounter (Signed)
*  STAT* If patient is at the pharmacy, call can be transferred to refill team.   1. Which medications need to be refilled? (please list name of each medication and dose if known)   potassium chloride SA (KLOR-CON M) 20 MEQ tablet    2. Which pharmacy/location (including street and city if local pharmacy) is medication to be sent to?  [facility] [address] [place], [place] - [address] AT [facility] [address]      3. Do they need a 30 day or 90 day supply? 90 Day

## 2023-04-26 NOTE — Telephone Encounter (Signed)
T/C from Vernona Rieger at [facility] asking if we can order a CMV IGM lab test on pt and have him come in this week for a lab appt.  They need this lab test as a requirement from his insurance.

## 2023-04-28 ENCOUNTER — Inpatient Hospital Stay: Payer: Medicare Other

## 2023-04-28 ENCOUNTER — Other Ambulatory Visit: Payer: Self-pay | Admitting: *Deleted

## 2023-04-28 DIAGNOSIS — Z7901 Long term (current) use of anticoagulants: Secondary | ICD-10-CM | POA: Diagnosis not present

## 2023-04-28 DIAGNOSIS — Z7969 Long term (current) use of other immunomodulators and immunosuppressants: Secondary | ICD-10-CM | POA: Diagnosis not present

## 2023-04-28 DIAGNOSIS — R918 Other nonspecific abnormal finding of lung field: Secondary | ICD-10-CM | POA: Diagnosis not present

## 2023-04-28 DIAGNOSIS — I700 Atherosclerosis of aorta: Secondary | ICD-10-CM | POA: Diagnosis not present

## 2023-04-28 DIAGNOSIS — C9000 Multiple myeloma not having achieved remission: Secondary | ICD-10-CM

## 2023-04-28 DIAGNOSIS — Z5112 Encounter for antineoplastic immunotherapy: Secondary | ICD-10-CM | POA: Diagnosis not present

## 2023-04-28 DIAGNOSIS — N200 Calculus of kidney: Secondary | ICD-10-CM | POA: Diagnosis not present

## 2023-04-28 DIAGNOSIS — M898X9 Other specified disorders of bone, unspecified site: Secondary | ICD-10-CM | POA: Diagnosis not present

## 2023-04-28 DIAGNOSIS — R222 Localized swelling, mass and lump, trunk: Secondary | ICD-10-CM | POA: Diagnosis not present

## 2023-04-28 DIAGNOSIS — Z7961 Long term (current) use of immunomodulator: Secondary | ICD-10-CM | POA: Diagnosis not present

## 2023-04-28 DIAGNOSIS — R5383 Other fatigue: Secondary | ICD-10-CM | POA: Diagnosis not present

## 2023-04-28 DIAGNOSIS — K7689 Other specified diseases of liver: Secondary | ICD-10-CM | POA: Diagnosis not present

## 2023-04-28 DIAGNOSIS — Z79899 Other long term (current) drug therapy: Secondary | ICD-10-CM | POA: Diagnosis not present

## 2023-04-28 DIAGNOSIS — Z881 Allergy status to other antibiotic agents status: Secondary | ICD-10-CM | POA: Diagnosis not present

## 2023-04-28 MED ORDER — POMALIDOMIDE 1 MG PO CAPS: 1.0000 mg | ORAL_CAPSULE | Freq: Every day | ORAL | 0 refills | Status: DC

## 2023-04-28 MED ORDER — SELINEXOR (60 MG ONCE WEEKLY) 60 MG PO TBPK: 60.0000 mg | ORAL_TABLET | ORAL | 0 refills | Status: DC

## 2023-04-29 ENCOUNTER — Telehealth: Payer: Self-pay | Admitting: Internal Medicine

## 2023-04-29 LAB — CMV IGM: CMV IgM: <30 [AU]/ml (ref 0.0–29.9)

## 2023-04-29 NOTE — Telephone Encounter (Signed)
Pt c/o medication issue:  1. Name of Medication:   potassium chloride SA (KLOR-CON M) 20 MEQ tablet    2. How are you currently taking this medication (dosage and times per day)?   3. Are you having a reaction (difficulty breathing--STAT)?   4. What is your medication issue? Pt called in stating he is completely out of medication however he states Dr. [name] changed his rx to 6x daily but it is not listed as that. Please advise.

## 2023-04-29 NOTE — Telephone Encounter (Signed)
Confirm how he is taking it

## 2023-04-29 NOTE — Telephone Encounter (Signed)
Patient states he is supposed to be taking 6 tablets of 20 meq of his potassium daily.   Can you clarify dose?

## 2023-04-29 NOTE — Telephone Encounter (Signed)
Patient states he is taking 2 tablets in am , 2 tablets at noon and 2 tablets in the evening.

## 2023-04-30 ENCOUNTER — Encounter: Payer: Self-pay | Admitting: Internal Medicine

## 2023-05-03 ENCOUNTER — Ambulatory Visit: Payer: Medicare Other

## 2023-05-03 DIAGNOSIS — R262 Difficulty in walking, not elsewhere classified: Secondary | ICD-10-CM

## 2023-05-03 DIAGNOSIS — M6281 Muscle weakness (generalized): Secondary | ICD-10-CM

## 2023-05-03 DIAGNOSIS — S22000A Wedge compression fracture of unspecified thoracic vertebra, initial encounter for closed fracture: Secondary | ICD-10-CM | POA: Diagnosis not present

## 2023-05-03 DIAGNOSIS — M542 Cervicalgia: Secondary | ICD-10-CM | POA: Diagnosis not present

## 2023-05-03 DIAGNOSIS — M546 Pain in thoracic spine: Secondary | ICD-10-CM

## 2023-05-03 DIAGNOSIS — R252 Cramp and spasm: Secondary | ICD-10-CM

## 2023-05-03 DIAGNOSIS — C9000 Multiple myeloma not having achieved remission: Secondary | ICD-10-CM | POA: Diagnosis not present

## 2023-05-03 DIAGNOSIS — R29898 Other symptoms and signs involving the musculoskeletal system: Secondary | ICD-10-CM | POA: Diagnosis not present

## 2023-05-03 DIAGNOSIS — R293 Abnormal posture: Secondary | ICD-10-CM | POA: Diagnosis not present

## 2023-05-03 LAB — MULTIPLE MYELOMA PANEL, SERUM
Albumin SerPl Elph-Mcnc: 3.5 g/dL (ref 2.9–4.4)
Albumin/Glob SerPl: 1.8 — ABNORMAL HIGH (ref 0.7–1.7)
Alpha 1: 0.2 g/dL (ref 0.0–0.4)
Alpha2 Glob SerPl Elph-Mcnc: 0.7 g/dL (ref 0.4–1.0)
B-Globulin SerPl Elph-Mcnc: 0.9 g/dL (ref 0.7–1.3)
Gamma Glob SerPl Elph-Mcnc: 0.2 g/dL — ABNORMAL LOW (ref 0.4–1.8)
Globulin, Total: 2 g/dL — ABNORMAL LOW (ref 2.2–3.9)
IgA: 11 mg/dL — ABNORMAL LOW (ref 61–437)
IgG (Immunoglobin G), Serum: 176 mg/dL — ABNORMAL LOW (ref 603–1613)
IgM (Immunoglobulin M), Srm: 11 mg/dL — ABNORMAL LOW (ref 15–143)
Total Protein ELP: 5.5 g/dL — ABNORMAL LOW (ref 6.0–8.5)

## 2023-05-03 NOTE — Telephone Encounter (Signed)
error 

## 2023-05-03 NOTE — Therapy (Signed)
 OUTPATIENT PHYSICAL THERAPY NECK AND THORACIC TREATMENT   Patient Name: Travis [name] MRN: [id number] DOB:1950/09/07, 72 y.o., male Today's Date: 05/03/2023  END OF SESSION:  PT End of Session - 05/03/23 1536     Visit Number 5    Date for PT Re-Evaluation 05/20/23    Authorization Type [employer] MCR    Progress Note Due on Visit 10    PT Start Time 1536    PT Stop Time 1615    PT Time Calculation (min) 39 min    Activity Tolerance Patient tolerated treatment well;Patient limited by pain    Behavior During Therapy [facility] for tasks assessed/performed              Past Medical History:  Diagnosis Date   A-fib (HCC)    Arthritis    comes and goes; mostly in hands, occasionally elbow (04/05/2017)   Complication of anesthesia    just wanted to sleep alot  for couple days S/P VARICOCELE OR (04/05/2017)   DVT (deep venous thrombosis) (HCC)    LLE   Dysrhythmia    PVCs    GERD (gastroesophageal reflux disease)    History of hiatal hernia    History of radiation therapy    Thoracic Spine- 07/13/22-07/23/22-Dr. Lynwood Nasuti   Hypertension    Impaired glucose tolerance    Multiple myeloma (HCC)    Obesity (BMI 30-39.9) 07/26/2016   OSA on CPAP 07/26/2016   Mild with AHI 12.5/hr now on CPAP at 14cm H2O   PAF (paroxysmal atrial fibrillation) (HCC)    s/p PVI Ablation in 2018 // Flecainide  Rx // Echo 8/21: EF 60-65, no RWMA, mild LVH, normal RV SF, moderate RVE, mild BAE, trivial MR, mild dilation of aortic root (40 mm)   Pneumonia    may have had walking pneumonia a few years ago (04/05/2017)   Pre-diabetes    Thrombophlebitis    Past Surgical History:  Procedure Laterality Date   ATRIAL FIBRILLATION ABLATION  04/05/2017   ATRIAL FIBRILLATION ABLATION N/A 04/05/2017   Procedure: ATRIAL FIBRILLATION ABLATION;  Surgeon: Kelsie Lynwood, MD;  Location: [facility];  Service: Cardiovascular;  Laterality: N/A;   BONE BIOPSY Right 12/17/2021   Procedure: BONE BIOPSY;   Surgeon: Cristy Bonner DASEN, MD;  Location: [facility];  Service: Orthopedics;  Laterality: Right;   COMPLETE RIGHT HIP REPLACMENT  01/19/2019   EVALUATION UNDER ANESTHESIA WITH HEMORRHOIDECTOMY N/A 02/24/2017   Procedure: EXAM UNDER ANESTHESIA WITH  HEMORRHOIDECTOMY;  Surgeon: Sheldon Standing, MD;  Location: [facility];  Service: General;  Laterality: N/A;   EXCISIONAL TOTAL HIP ARTHROPLASTY WITH ANTIBIOTIC SPACERS Right 09/25/2020   Procedure: EXCISIONAL TOTAL HIP ARTHROPLASTY WITH ANTIBIOTIC SPACERS;  Surgeon: Ernie Cough, MD;  Location: [facility];  Service: Orthopedics;  Laterality: Right;   FLEXIBLE SIGMOIDOSCOPY N/A 04/15/2015   Procedure:  UNSEDATED FLEXIBLE SIGMOIDOSCOPY;  Surgeon: Gladis MARLA Louder, MD;  Location: [facility];  Service: Endoscopy;  Laterality: N/A;   HUMERUS IM NAIL Right 12/17/2021   Procedure: INTRAMEDULLARY (IM) NAIL HUMERAL;  Surgeon: Cristy Bonner DASEN, MD;  Location: [facility];  Service: Orthopedics;  Laterality: Right;   KNEE ARTHROSCOPY Left ~ 2016   POSTERIOR CERVICAL FUSION/FORAMINOTOMY N/A 06/04/2022   Procedure: Thoracic one - Thoracic Four Posterior lateral arthrodesis/ fusion and Thoracic two Corpectomy;  Surgeon: Gillie Duncans, MD;  Location: [facility];  Service: Neurosurgery;  Laterality: N/A;   REIMPLANTATION OF TOTAL HIP Right 12/25/2020   Procedure: REIMPLANTATION/REVISION OF RIGHT TOTAL HIP  VERSUS REPEAT IRRIGATION AND DEBRIDEMENT;  Surgeon: Ernie Cough, MD;  Location: [facility];  Service: Orthopedics;  Laterality: Right;   TESTICLE SURGERY  1988   VARICOCELE   TONSILLECTOMY     VARICOSE VEIN SURGERY Bilateral    Patient Active Problem List   Diagnosis Date Noted   Pancreatic cyst 09/30/2022   Infection due to novel influenza A virus 09/29/2022   Persistent atrial fibrillation (HCC) 07/15/2022   Acute on chronic diastolic heart failure (HCC) 07/03/2022   Pulmonary infiltrates 07/01/2022   Acute respiratory failure with hypoxia (HCC)  07/01/2022   Hyponatremia 06/29/2022   History of thoracic surgery 06/29/2022   Acute DVT of left tibial vein (HCC) 06/21/2022   Hypokalemia 06/21/2022   Vitamin D  deficiency 05/30/2022   Other constipation 05/29/2022   Pathologic compression fracture of spine, initial encounter (HCC) 05/29/2022   Cord compression (HCC) 05/28/2022   Degenerative cervical disc 05/14/2022   Hypercoagulable state due to persistent atrial fibrillation (HCC) 01/11/2022   Multiple myeloma (HCC) 12/10/2021   Medication monitoring encounter 02/27/2021   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   Status post peripherally inserted central catheter (PICC) central line placement 11/06/2020   Drug rash with eosinophilia and systemic symptoms syndrome 11/06/2020   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   PAF (paroxysmal atrial fibrillation) (HCC)    GERD (gastroesophageal reflux disease) 03/29/2018   Hiatal hernia 03/29/2018   History of DVT (deep vein thrombosis) 03/29/2018   CAP (community acquired pneumonia) 03/29/2018   Osteoarthritis 03/29/2018   Neuropathy 08/29/2017   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   Paroxysmal atrial fibrillation with rapid ventricular response (HCC) 04/05/2017   Prolapsed internal hemorrhoids, grade 4, s/p ligation/pexy/hemorrhoidectomy x 2 02/24/2017 02/24/2017   OSA (obstructive sleep apnea) 07/26/2016   Obesity (BMI 30-39.9) 07/26/2016   Atrial fibrillation with RVR (HCC)    Essential hypertension    Chest pain at rest     PCP: Charlott Dorn LABOR, MD   REFERRING PROVIDER: Federico Norleen ONEIDA MADISON, MD   REFERRING DIAG: C90.00 (ICD-10-CM) - Multiple myeloma not having achieved remission (HCC)   THERAPY DIAG:  Muscle weakness (generalized)  Pain in thoracic spine  Cramp and spasm  Difficulty in walking, not elsewhere classified  Rationale for Evaluation and Treatment: Rehabilitation  ONSET DATE: Feb 2024   SUBJECTIVE:   SUBJECTIVE  STATEMENT: Patient reports he had a great time over Christmas with his grandson and was able to be up on his feet long enough to walk around the [place].  He states he did use his rollator walker but he arrives using cane today.  Gait  and start up are less antalgic upon entering clinic today.    PERTINENT HISTORY: significant for IgA Kappa Multiple Myeloma. He was diagnosed with smouldering myeloma in 2018 . July 2023 he developed right arm pain due to a pathologic fx and he underwent radiation and fixation. In early Feb 2024 he had radiation for pathologic T2 compression fx followed by a T1-T4 fusion.  PAIN:  Are you having pain? Yes: NPRS scale: 7/10 Pain location: R arm and upper traps; between shoulder blades, some low back  Pain description: constant tightness.soreness Aggravating factors: not sure Relieving factors: shoulder rolls, aquatic PT. Hot tub  PRECAUTIONS: Other: Multiple Myeloma with mets to R ribs, R humerus with ORIF for pathological fracture 12/17/21. Lesions have also been found in R rib and also in his skull. Pathologic fx T2 with fusion T1-4 on 06/04/22, Bilateral  LE edema (on Lasix ), Afib   RED FLAGS: None   WEIGHT BEARING RESTRICTIONS: No  FALLS:  Has patient fallen in last 6 months? Yes. Number of falls 1 walking to fast and tripped over feet  LIVING ENVIRONMENT: Lives with: lives with their family and lives with their spouse Lives in: House/apartment Stairs: Yes; Internal: 14 steps; on left going up and External: 3 steps; on right going up Has following equipment at home: Single point cane and Walker - 4 wheeled  OCCUPATION: semi-retired, attorney  PLOF: Independent with household mobility with device  PATIENT GOALS: stretch these muscles out  NEXT MD VISIT: 12/4  OBJECTIVE:  Note: Objective measures were completed at Evaluation unless otherwise noted.  DIAGNOSTIC FINDINGS: see chart  PATIENT SURVEYS:  NDI 15 / 50 = 30.0  %  COGNITION: Overall cognitive status: Within functional limits for tasks assessed     SENSATION: Some tingling in his hands and feet   POSTURE: rounded shoulders, forward head, and increased thoracic kyphosis Sits in significant forward head, thoracic kyphosis  PALPATION: Marked tightness in B cervical paraspinals, levator, UT  CERVICAL ROM: * pain  Active ROM A/PROM (deg) eval  Flexion full  Extension 4 deg  Right lateral flexion 17  Left lateral flexion 13*  Right rotation 38  Left rotation 42   (Blank rows = not tested)   SHOULDER AROM (tested in sitting): Flex  R 102 deg, L 123 deg ABD R 88 deg, L 124 deg  Functional IR R thumb to L5, L to T10 Functional ER - can reach behind head easily L, must bend neck forward R  SHOULDER STRENGTH: Flex R 4+/5, ABD R 4/5  else 5/5 B    TODAY'S TREATMENT:                                                                                                                              DATE:   05/03/23 Nustep x 5 min level 3 Standing hamstring stretch 3 x 30 sec each LE Standing quad/hip flexor (lunge position on steps) stretch 3 x 20 sec each LE Side lunge adductor stretch at steps 3 x 20 sec each Seated row with green band with handles (PT holding other end of band) Seated thoracic stretch over back of chair x 10 Seated thoracic stretch in chair with ball behind the back x 10 Seated bilateral shoulder ER with green band 2 x 10 Standing facing wall arm raises x 10 circling in  then x 10 circling out Standing doorway stretch x 5 holding 10 sec each  04/21/23 Nustep x 5 min level 3 Seated hamstring stretch 3 x 20 sec each LE (with foot on foot stool) Seated gentle piriformis stretch using strap 3 x 30 sec each LE Seated clam with red loop x 20 Seated bilateral shoulder ER with green tband x 20 Seated bilateral shoulder horizontal abduction with green tband x 20 Seated bilateral shoulder rows with green  tband x 20 Seated  bilateral shoulder extension with green tband x 20 Educated patient on importance of postural elongation and avoiding shoulder elevation, keeping eyes up, walking for exercise   04/19/23 Nustep x 8 min level 3 Seated hamstring stretch 3 x 20 sec each LE Seated gentle piriformis stretch using strap 3 x 20 sec each LE Supine with propping techniques for upper body and legs: initially attempted self stretching with strap but patient unable to do effectively:  did all manually for right hamstring, hip abductors and hip adductors x 15 min Supine knee to chest with min assist 2 x 10 Supine heel slides with towel under heel 2 x 10 Supine hip abduction with towel under heel 2 x 10 Worked on transfers and safety with bed mobility Seated in chair with ice to right hip x 10 min to reduce pain   03/25/23 See pt ed and HEP   PATIENT EDUCATION:  Education details: PT eval findings, anticipated POC, initial HEP, and role of DN  Person educated: Patient Education method: Explanation, Demonstration, and Handouts Education comprehension: verbalized understanding and returned demonstration  HOME EXERCISE PROGRAM: Access Code: [email or web address] URL: [email or web address] Date: 03/25/2023 Prepared by: Mliss  Exercises - Seated Cervical Rotation AROM  - 1 x daily - 7 x weekly - 1 sets - 10 reps - 10 hold - Seated Cervical Sidebending AROM  - 1 x daily - 7 x weekly - 1 sets - 10 reps - 5 hold - Seated Cervical Extension AROM  - 1 x daily - 7 x weekly - 1 sets - 10 reps - 10 hold - Seated Scapular Retraction  - 1 x daily - 7 x weekly - 1-3 sets - 10 reps - 2-3 sec hold - Sidelying Thoracic Rotation with Open Book  - 1 x daily - 7 x weekly - 2 sets - 5 reps - Seated Thoracic Extension Arms Overhead  - 1 x daily - 7 x weekly - 1-2 sets - 10 reps - 2-3 sec hold - Seated Trunk Rotation  - 1 x daily - 7 x weekly - 1-2 sets - 10 reps - 2-3 sec hold - Seated Diagonal Chops with Medicine Ball  - 1 x  daily - 7 x weekly - 1-2 sets - 10 reps - 2-3 hold  ASSESSMENT:  CLINICAL IMPRESSION: Travis [name] using SPC today.  He states he was able to enjoy spending time with his grandson and family while they were here visiting for Christmas.  He was able to use the rollator walker and make it around the [facility] without significant pain or post rest issues.  He tolerated several standing stretches today without need to sit and rest.  We worked on postural exercises as well.  He would benefit from continuing skilled PT for postural strengthening and functional mobility.    OBJECTIVE IMPAIRMENTS: decreased activity tolerance, decreased ROM, decreased strength, increased muscle spasms, impaired flexibility, impaired UE functional use, postural dysfunction, and pain.   ACTIVITY LIMITATIONS: lifting, sleeping, bathing, dressing, and reach over head  PARTICIPATION LIMITATIONS: driving and golf  PERSONAL FACTORS: Fitness, Past/current experiences, and 3+ comorbidities: multiple myeloma, previous fractures  are also affecting patient's functional outcome.   REHAB POTENTIAL: Good  CLINICAL DECISION MAKING: Evolving/moderate complexity  EVALUATION COMPLEXITY: Moderate   GOALS: Goals reviewed with patient? Yes  SHORT TERM GOALS: Target date: 04/22/2023   Patient will be independent with initial HEP.  Baseline:  Goal status: MET 05/03/23 2.  Improved mobility in  thoracic spine by 50% with exercises.  Baseline:  Goal status: MET 05/03/23   LONG TERM GOALS: Target date: 05/20/2023   Patient will be independent with advanced/ongoing HEP to improve outcomes and carryover.  Baseline:  Goal status: INITIAL  2.  Patient will report 50% improvement in neck and upper back pain to improve QOL.  Baseline:  Goal status: INITIAL  3.  Patient will demonstrate improved cervical extension to 10 deg Baseline:  Goal status:INITIAL  4.  Patient will demonstrate improved upper back strength  as evidenced by improved sitting and standing posture. Baseline:  Goal status:INITIAL  5.  Patient will report 7/50 on NDI to demonstrate improved functional ability.  Baseline:  Goal status: INITIAL   PLAN:  PT FREQUENCY: 2x/week  PT DURATION: 8 weeks  PLANNED INTERVENTIONS: 97164- PT Re-evaluation, 97110-Therapeutic exercises, 97530- Therapeutic activity, [id number]- Neuromuscular re-education, 97535- Self Care, [id number]- Manual therapy, [id number]- Aquatic Therapy, 97014- Electrical stimulation (unattended), Patient/Family education, Taping, Dry Needling, Cryotherapy, and Moist heat  PLAN FOR NEXT SESSION: No mobilizations due to previous fractures, assess right hip status, work on thoracic and cervical mobility,  postural strength.     Delon B. Hayla Hinger, PT 05/03/23 9:46 PM [facility] [address], [address] [place], [place] [address] Phone # [phone] Fax [phone]

## 2023-05-05 ENCOUNTER — Ambulatory Visit: Payer: Medicare Other

## 2023-05-05 ENCOUNTER — Inpatient Hospital Stay: Payer: Medicare Other | Attending: Hematology and Oncology

## 2023-05-05 DIAGNOSIS — C9000 Multiple myeloma not having achieved remission: Secondary | ICD-10-CM | POA: Diagnosis not present

## 2023-05-05 DIAGNOSIS — Z9481 Bone marrow transplant status: Secondary | ICD-10-CM | POA: Diagnosis not present

## 2023-05-06 DIAGNOSIS — E8770 Fluid overload, unspecified: Secondary | ICD-10-CM | POA: Diagnosis not present

## 2023-05-06 DIAGNOSIS — C9000 Multiple myeloma not having achieved remission: Secondary | ICD-10-CM | POA: Diagnosis not present

## 2023-05-09 ENCOUNTER — Telehealth: Payer: Self-pay | Admitting: Hematology and Oncology

## 2023-05-09 ENCOUNTER — Other Ambulatory Visit: Payer: Self-pay | Admitting: Neurology

## 2023-05-09 ENCOUNTER — Other Ambulatory Visit: Payer: Self-pay | Admitting: Hematology and Oncology

## 2023-05-09 ENCOUNTER — Encounter: Payer: Self-pay | Admitting: Internal Medicine

## 2023-05-09 DIAGNOSIS — L720 Epidermal cyst: Secondary | ICD-10-CM | POA: Diagnosis not present

## 2023-05-09 DIAGNOSIS — R208 Other disturbances of skin sensation: Secondary | ICD-10-CM | POA: Diagnosis not present

## 2023-05-09 DIAGNOSIS — L728 Other follicular cysts of the skin and subcutaneous tissue: Secondary | ICD-10-CM | POA: Diagnosis not present

## 2023-05-09 MED ORDER — HYDRALAZINE HCL 100 MG PO TABS: 100.0000 mg | ORAL_TABLET | Freq: Three times a day (TID) | ORAL | 3 refills | Status: DC

## 2023-05-10 ENCOUNTER — Ambulatory Visit: Payer: Medicare Other | Attending: Hematology and Oncology

## 2023-05-10 DIAGNOSIS — R262 Difficulty in walking, not elsewhere classified: Secondary | ICD-10-CM | POA: Insufficient documentation

## 2023-05-10 DIAGNOSIS — R252 Cramp and spasm: Secondary | ICD-10-CM | POA: Diagnosis not present

## 2023-05-10 DIAGNOSIS — R29898 Other symptoms and signs involving the musculoskeletal system: Secondary | ICD-10-CM | POA: Diagnosis not present

## 2023-05-10 DIAGNOSIS — M6281 Muscle weakness (generalized): Secondary | ICD-10-CM | POA: Insufficient documentation

## 2023-05-10 DIAGNOSIS — R293 Abnormal posture: Secondary | ICD-10-CM | POA: Insufficient documentation

## 2023-05-10 DIAGNOSIS — M546 Pain in thoracic spine: Secondary | ICD-10-CM | POA: Diagnosis not present

## 2023-05-10 DIAGNOSIS — S22000A Wedge compression fracture of unspecified thoracic vertebra, initial encounter for closed fracture: Secondary | ICD-10-CM | POA: Insufficient documentation

## 2023-05-10 DIAGNOSIS — C9000 Multiple myeloma not having achieved remission: Secondary | ICD-10-CM | POA: Diagnosis not present

## 2023-05-10 DIAGNOSIS — M542 Cervicalgia: Secondary | ICD-10-CM | POA: Insufficient documentation

## 2023-05-10 NOTE — Therapy (Signed)
 OUTPATIENT PHYSICAL THERAPY NECK AND THORACIC TREATMENT   Patient Name: Travis [name] MRN: [id number] DOB:1951-03-08, 73 y.o., male Today's Date: 05/10/2023  END OF SESSION:  PT End of Session - 05/10/23 1134     Visit Number 6    Date for PT Re-Evaluation 05/20/23    Authorization Type [employer] MCR    Progress Note Due on Visit 10    PT Start Time 1135    PT Stop Time 1224    PT Time Calculation (min) 49 min    Activity Tolerance Patient tolerated treatment well;Patient limited by pain    Behavior During Therapy [facility] for tasks assessed/performed              Past Medical History:  Diagnosis Date   A-fib (HCC)    Arthritis    comes and goes; mostly in hands, occasionally elbow (04/05/2017)   Complication of anesthesia    just wanted to sleep alot  for couple days S/P VARICOCELE OR (04/05/2017)   DVT (deep venous thrombosis) (HCC)    LLE   Dysrhythmia    PVCs    GERD (gastroesophageal reflux disease)    History of hiatal hernia    History of radiation therapy    Thoracic Spine- 07/13/22-07/23/22-Dr. Lynwood Nasuti   Hypertension    Impaired glucose tolerance    Multiple myeloma (HCC)    Obesity (BMI 30-39.9) 07/26/2016   OSA on CPAP 07/26/2016   Mild with AHI 12.5/hr now on CPAP at 14cm H2O   PAF (paroxysmal atrial fibrillation) (HCC)    s/p PVI Ablation in 2018 // Flecainide  Rx // Echo 8/21: EF 60-65, no RWMA, mild LVH, normal RV SF, moderate RVE, mild BAE, trivial MR, mild dilation of aortic root (40 mm)   Pneumonia    may have had walking pneumonia a few years ago (04/05/2017)   Pre-diabetes    Thrombophlebitis    Past Surgical History:  Procedure Laterality Date   ATRIAL FIBRILLATION ABLATION  04/05/2017   ATRIAL FIBRILLATION ABLATION N/A 04/05/2017   Procedure: ATRIAL FIBRILLATION ABLATION;  Surgeon: Kelsie Lynwood, MD;  Location: [facility];  Service: Cardiovascular;  Laterality: N/A;   BONE BIOPSY Right 12/17/2021   Procedure: BONE BIOPSY;   Surgeon: Cristy Bonner DASEN, MD;  Location: [facility];  Service: Orthopedics;  Laterality: Right;   COMPLETE RIGHT HIP REPLACMENT  01/19/2019   EVALUATION UNDER ANESTHESIA WITH HEMORRHOIDECTOMY N/A 02/24/2017   Procedure: EXAM UNDER ANESTHESIA WITH  HEMORRHOIDECTOMY;  Surgeon: Sheldon Standing, MD;  Location: [facility];  Service: General;  Laterality: N/A;   EXCISIONAL TOTAL HIP ARTHROPLASTY WITH ANTIBIOTIC SPACERS Right 09/25/2020   Procedure: EXCISIONAL TOTAL HIP ARTHROPLASTY WITH ANTIBIOTIC SPACERS;  Surgeon: Ernie Cough, MD;  Location: [facility];  Service: Orthopedics;  Laterality: Right;   FLEXIBLE SIGMOIDOSCOPY N/A 04/15/2015   Procedure:  UNSEDATED FLEXIBLE SIGMOIDOSCOPY;  Surgeon: Gladis MARLA Louder, MD;  Location: [facility];  Service: Endoscopy;  Laterality: N/A;   HUMERUS IM NAIL Right 12/17/2021   Procedure: INTRAMEDULLARY (IM) NAIL HUMERAL;  Surgeon: Cristy Bonner DASEN, MD;  Location:  SURGERY CENTER;  Service: Orthopedics;  Laterality: Right;   KNEE ARTHROSCOPY Left ~ 2016   POSTERIOR CERVICAL FUSION/FORAMINOTOMY N/A 06/04/2022   Procedure: Thoracic one - Thoracic Four Posterior lateral arthrodesis/ fusion and Thoracic two Corpectomy;  Surgeon: Gillie Duncans, MD;  Location: [facility];  Service: Neurosurgery;  Laterality: N/A;   REIMPLANTATION OF TOTAL HIP Right 12/25/2020   Procedure: REIMPLANTATION/REVISION OF RIGHT TOTAL HIP  VERSUS REPEAT IRRIGATION AND DEBRIDEMENT;  Surgeon: Ernie Cough, MD;  Location: [facility];  Service: Orthopedics;  Laterality: Right;   TESTICLE SURGERY  1988   VARICOCELE   TONSILLECTOMY     VARICOSE VEIN SURGERY Bilateral    Patient Active Problem List   Diagnosis Date Noted   Pancreatic cyst 09/30/2022   Infection due to novel influenza A virus 09/29/2022   Persistent atrial fibrillation (HCC) 07/15/2022   Acute on chronic diastolic heart failure (HCC) 07/03/2022   Pulmonary infiltrates 07/01/2022   Acute respiratory failure with hypoxia (HCC)  07/01/2022   Hyponatremia 06/29/2022   History of thoracic surgery 06/29/2022   Acute DVT of left tibial vein (HCC) 06/21/2022   Hypokalemia 06/21/2022   Vitamin D  deficiency 05/30/2022   Other constipation 05/29/2022   Pathologic compression fracture of spine, initial encounter (HCC) 05/29/2022   Cord compression (HCC) 05/28/2022   Degenerative cervical disc 05/14/2022   Hypercoagulable state due to persistent atrial fibrillation (HCC) 01/11/2022   Multiple myeloma (HCC) 12/10/2021   Medication monitoring encounter 02/27/2021   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   Status post peripherally inserted central catheter (PICC) central line placement 11/06/2020   Drug rash with eosinophilia and systemic symptoms syndrome 11/06/2020   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   PAF (paroxysmal atrial fibrillation) (HCC)    GERD (gastroesophageal reflux disease) 03/29/2018   Hiatal hernia 03/29/2018   History of DVT (deep vein thrombosis) 03/29/2018   CAP (community acquired pneumonia) 03/29/2018   Osteoarthritis 03/29/2018   Neuropathy 08/29/2017   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   Paroxysmal atrial fibrillation with rapid ventricular response (HCC) 04/05/2017   Prolapsed internal hemorrhoids, grade 4, s/p ligation/pexy/hemorrhoidectomy x 2 02/24/2017 02/24/2017   OSA (obstructive sleep apnea) 07/26/2016   Obesity (BMI 30-39.9) 07/26/2016   Atrial fibrillation with RVR (HCC)    Essential hypertension    Chest pain at rest     PCP: Charlott Dorn LABOR, MD   REFERRING PROVIDER: Federico Norleen ONEIDA MADISON, MD   REFERRING DIAG: C90.00 (ICD-10-CM) - Multiple myeloma not having achieved remission (HCC)   THERAPY DIAG:  Muscle weakness (generalized)  Pain in thoracic spine  Cramp and spasm  Difficulty in walking, not elsewhere classified  Abnormal posture  Compression fracture of thoracic vertebra, unspecified thoracic vertebral level, initial encounter  (HCC)  Cervicalgia  Multiple myeloma not having achieved remission [facility])  Rationale for Evaluation and Treatment: Rehabilitation  ONSET DATE: Feb 2024   SUBJECTIVE:   SUBJECTIVE STATEMENT: Patient reports he had his T-cell harvesting this past week.  He took his chemo medication yesterday and feels kind of achy and not so good today.  Arrives with cane, however, and demonstrates good heel to toe progression.   PERTINENT HISTORY: significant for IgA Kappa Multiple Myeloma. He was diagnosed with smouldering myeloma in 2018 . July 2023 he developed right arm pain due to a pathologic fx and he underwent radiation and fixation. In early Feb 2024 he had radiation for pathologic T2 compression fx followed by a T1-T4 fusion.  PAIN:  Are you having pain? Yes: NPRS scale: 7/10 Pain location: R arm and upper traps; between shoulder blades, some low back  Pain description: constant tightness.soreness Aggravating factors: not sure Relieving factors: shoulder rolls, aquatic PT. Hot tub  PRECAUTIONS: Other: Multiple Myeloma with mets to R ribs, R humerus with ORIF for pathological fracture 12/17/21. Lesions have also been found in R rib and also in his skull. Pathologic fx T2  with fusion T1-4 on 06/04/22, Bilateral LE edema (on Lasix ), Afib   RED FLAGS: None   WEIGHT BEARING RESTRICTIONS: No  FALLS:  Has patient fallen in last 6 months? Yes. Number of falls 1 walking to fast and tripped over feet  LIVING ENVIRONMENT: Lives with: lives with their family and lives with their spouse Lives in: House/apartment Stairs: Yes; Internal: 14 steps; on left going up and External: 3 steps; on right going up Has following equipment at home: Single point cane and Walker - 4 wheeled  OCCUPATION: semi-retired, attorney  PLOF: Independent with household mobility with device  PATIENT GOALS: stretch these muscles out  NEXT MD VISIT: 12/4  OBJECTIVE:  Note: Objective measures were completed at Evaluation  unless otherwise noted.  DIAGNOSTIC FINDINGS: see chart  PATIENT SURVEYS:  NDI 15 / 50 = 30.0 %  COGNITION: Overall cognitive status: Within functional limits for tasks assessed     SENSATION: Some tingling in his hands and feet   POSTURE: rounded shoulders, forward head, and increased thoracic kyphosis Sits in significant forward head, thoracic kyphosis  PALPATION: Marked tightness in B cervical paraspinals, levator, UT  CERVICAL ROM: * pain  Active ROM A/PROM (deg) eval  Flexion full  Extension 4 deg  Right lateral flexion 17  Left lateral flexion 13*  Right rotation 38  Left rotation 42   (Blank rows = not tested)   SHOULDER AROM (tested in sitting): Flex  R 102 deg, L 123 deg ABD R 88 deg, L 124 deg  Functional IR R thumb to L5, L to T10 Functional ER - can reach behind head easily L, must bend neck forward R  SHOULDER STRENGTH: Flex R 4+/5, ABD R 4/5  else 5/5 B    TODAY'S TREATMENT:                                                                                                                              DATE:   05/10/23 Nustep x 8 min level 3 Standing hamstring stretch 3 x 20 sec each LE Standing quad/hip flexor (lunge position on steps) stretch 3 x 20 sec each LE Side lunge adductor stretch at steps 2 x 20 sec each Seated clam with yellow loop x 20 Sit to stand x 10 lightly using hands Lateral band walks with blue loop x 5 laps at barre Seated hamstring curl with green band x 20 each LE Seated mini sit up with 10 lb kb Seated fwd press with 5 lb dumbbells 2 x 10 Seated overhead press with 5 lb dumbbells 2 x 10 Seated row with green band with handles (PT holding other end of band) Seated thoracic stretch in chair with ball behind the back x 10 Seated bilateral shoulder ER with green band 2 x 10 Seated bilateral shoulder horizontal abduction with green band 2 x 10   05/03/23 Nustep x 5 min level 3 Standing hamstring stretch 3 x 30 sec each  LE Standing quad/hip flexor (lunge position on steps) stretch 3 x 20 sec each LE Side lunge adductor stretch at steps 3 x 20 sec each Seated row with green band with handles (PT holding other end of band) Seated thoracic stretch over back of chair x 10 Seated thoracic stretch in chair with ball behind the back x 10 Seated bilateral shoulder ER with green band 2 x 10 Standing facing wall arm raises x 10 circling in  then x 10 circling out Standing doorway stretch x 5 holding 10 sec each  04/21/23 Nustep x 5 min level 3 Seated hamstring stretch 3 x 20 sec each LE (with foot on foot stool) Seated gentle piriformis stretch using strap 3 x 30 sec each LE Seated clam with red loop x 20 Seated bilateral shoulder ER with green tband x 20 Seated bilateral shoulder horizontal abduction with green tband x 20 Seated bilateral shoulder rows with green tband x 20 Seated bilateral shoulder extension with green tband x 20 Educated patient on importance of postural elongation and avoiding shoulder elevation, keeping eyes up, walking for exercise  03/25/23 See pt ed and HEP   PATIENT EDUCATION:  Education details: PT eval findings, anticipated POC, initial HEP, and role of DN  Person educated: Patient Education method: Explanation, Demonstration, and Handouts Education comprehension: verbalized understanding and returned demonstration  HOME EXERCISE PROGRAM: Access Code: [email or web address] URL: [email or web address] Date: 03/25/2023 Prepared by: Mliss  Exercises - Seated Cervical Rotation AROM  - 1 x daily - 7 x weekly - 1 sets - 10 reps - 10 hold - Seated Cervical Sidebending AROM  - 1 x daily - 7 x weekly - 1 sets - 10 reps - 5 hold - Seated Cervical Extension AROM  - 1 x daily - 7 x weekly - 1 sets - 10 reps - 10 hold - Seated Scapular Retraction  - 1 x daily - 7 x weekly - 1-3 sets - 10 reps - 2-3 sec hold - Sidelying Thoracic Rotation with Open Book  - 1 x daily - 7 x weekly - 2 sets  - 5 reps - Seated Thoracic Extension Arms Overhead  - 1 x daily - 7 x weekly - 1-2 sets - 10 reps - 2-3 sec hold - Seated Trunk Rotation  - 1 x daily - 7 x weekly - 1-2 sets - 10 reps - 2-3 sec hold - Seated Diagonal Chops with Medicine Ball  - 1 x daily - 7 x weekly - 1-2 sets - 10 reps - 2-3 hold  ASSESSMENT:  CLINICAL IMPRESSION: Bill arrived using SPC again today.  We attempted several standing tasks consecutively and he was able to do this with minimal fatigue.  He struggles significantly with posture.  He is fused at the C spine which limits his cervical mobility.  He has severe thoracic kyphosis.  He works diligently during each session to gain strength.  He is well motivated and compliant. He should continue to do well.   He would benefit from continuing skilled PT for postural strengthening and functional mobility.    OBJECTIVE IMPAIRMENTS: decreased activity tolerance, decreased ROM, decreased strength, increased muscle spasms, impaired flexibility, impaired UE functional use, postural dysfunction, and pain.   ACTIVITY LIMITATIONS: lifting, sleeping, bathing, dressing, and reach over head  PARTICIPATION LIMITATIONS: driving and golf  PERSONAL FACTORS: Fitness, Past/current experiences, and 3+ comorbidities: multiple myeloma, previous fractures  are also affecting patient's functional outcome.   REHAB POTENTIAL: Good  CLINICAL DECISION MAKING: Evolving/moderate complexity  EVALUATION COMPLEXITY: Moderate   GOALS: Goals reviewed with patient? Yes  SHORT TERM GOALS: Target date: 04/22/2023   Patient will be independent with initial HEP.  Baseline:  Goal status: MET 05/03/23 2.  Improved mobility in thoracic spine by 50% with exercises.  Baseline:  Goal status: MET 05/03/23   LONG TERM GOALS: Target date: 05/20/2023   Patient will be independent with advanced/ongoing HEP to improve outcomes and carryover.  Baseline:  Goal status: INITIAL  2.  Patient will report 50%  improvement in neck and upper back pain to improve QOL.  Baseline:  Goal status: INITIAL  3.  Patient will demonstrate improved cervical extension to 10 deg Baseline:  Goal status:INITIAL  4.  Patient will demonstrate improved upper back strength as evidenced by improved sitting and standing posture. Baseline:  Goal status:INITIAL  5.  Patient will report 7/50 on NDI to demonstrate improved functional ability.  Baseline:  Goal status: INITIAL   PLAN:  PT FREQUENCY: 2x/week  PT DURATION: 8 weeks  PLANNED INTERVENTIONS: 97164- PT Re-evaluation, 97110-Therapeutic exercises, 97530- Therapeutic activity, [id number]- Neuromuscular re-education, 97535- Self Care, [id number]- Manual therapy, [id number]- Aquatic Therapy, 97014- Electrical stimulation (unattended), Patient/Family education, Taping, Dry Needling, Cryotherapy, and Moist heat  PLAN FOR NEXT SESSION: No mobilizations due to previous fractures, monitor right hip status, work on thoracic and cervical mobility,  postural strength, hip mobility.      Delon B. Kieana Livesay, PT 05/10/23 12:44 PM [facility] [address], [address] [place], [place] [address] Phone # [phone] Fax [phone]

## 2023-05-11 ENCOUNTER — Other Ambulatory Visit: Payer: Self-pay | Admitting: Hematology and Oncology

## 2023-05-11 ENCOUNTER — Inpatient Hospital Stay: Payer: Medicare Other | Attending: Hematology and Oncology

## 2023-05-11 ENCOUNTER — Inpatient Hospital Stay: Payer: Medicare Other | Admitting: Hematology and Oncology

## 2023-05-11 ENCOUNTER — Ambulatory Visit: Payer: Medicare Other

## 2023-05-11 VITALS — BP 130/57 | HR 56 | Temp 98.0°F | Resp 16 | Wt 276.2 lb

## 2023-05-11 DIAGNOSIS — R21 Rash and other nonspecific skin eruption: Secondary | ICD-10-CM | POA: Insufficient documentation

## 2023-05-11 DIAGNOSIS — Z9089 Acquired absence of other organs: Secondary | ICD-10-CM | POA: Diagnosis not present

## 2023-05-11 DIAGNOSIS — Z818 Family history of other mental and behavioral disorders: Secondary | ICD-10-CM | POA: Insufficient documentation

## 2023-05-11 DIAGNOSIS — M533 Sacrococcygeal disorders, not elsewhere classified: Secondary | ICD-10-CM | POA: Insufficient documentation

## 2023-05-11 DIAGNOSIS — Z8249 Family history of ischemic heart disease and other diseases of the circulatory system: Secondary | ICD-10-CM | POA: Diagnosis not present

## 2023-05-11 DIAGNOSIS — K219 Gastro-esophageal reflux disease without esophagitis: Secondary | ICD-10-CM | POA: Insufficient documentation

## 2023-05-11 DIAGNOSIS — R0781 Pleurodynia: Secondary | ICD-10-CM | POA: Insufficient documentation

## 2023-05-11 DIAGNOSIS — M48061 Spinal stenosis, lumbar region without neurogenic claudication: Secondary | ICD-10-CM | POA: Insufficient documentation

## 2023-05-11 DIAGNOSIS — C9000 Multiple myeloma not having achieved remission: Secondary | ICD-10-CM

## 2023-05-11 DIAGNOSIS — G4733 Obstructive sleep apnea (adult) (pediatric): Secondary | ICD-10-CM | POA: Diagnosis not present

## 2023-05-11 DIAGNOSIS — R1310 Dysphagia, unspecified: Secondary | ICD-10-CM | POA: Diagnosis not present

## 2023-05-11 DIAGNOSIS — Z79624 Long term (current) use of inhibitors of nucleotide synthesis: Secondary | ICD-10-CM | POA: Diagnosis not present

## 2023-05-11 DIAGNOSIS — Z881 Allergy status to other antibiotic agents status: Secondary | ICD-10-CM | POA: Insufficient documentation

## 2023-05-11 DIAGNOSIS — Z7901 Long term (current) use of anticoagulants: Secondary | ICD-10-CM | POA: Diagnosis not present

## 2023-05-11 DIAGNOSIS — M5126 Other intervertebral disc displacement, lumbar region: Secondary | ICD-10-CM | POA: Insufficient documentation

## 2023-05-11 DIAGNOSIS — M542 Cervicalgia: Secondary | ICD-10-CM

## 2023-05-11 DIAGNOSIS — I10 Essential (primary) hypertension: Secondary | ICD-10-CM | POA: Insufficient documentation

## 2023-05-11 DIAGNOSIS — Z823 Family history of stroke: Secondary | ICD-10-CM | POA: Insufficient documentation

## 2023-05-11 DIAGNOSIS — R600 Localized edema: Secondary | ICD-10-CM | POA: Insufficient documentation

## 2023-05-11 DIAGNOSIS — Z79899 Other long term (current) drug therapy: Secondary | ICD-10-CM | POA: Diagnosis not present

## 2023-05-11 DIAGNOSIS — E876 Hypokalemia: Secondary | ICD-10-CM | POA: Diagnosis not present

## 2023-05-11 DIAGNOSIS — I5033 Acute on chronic diastolic (congestive) heart failure: Secondary | ICD-10-CM

## 2023-05-11 DIAGNOSIS — I480 Paroxysmal atrial fibrillation: Secondary | ICD-10-CM | POA: Diagnosis not present

## 2023-05-11 DIAGNOSIS — M546 Pain in thoracic spine: Secondary | ICD-10-CM | POA: Insufficient documentation

## 2023-05-11 DIAGNOSIS — Z86718 Personal history of other venous thrombosis and embolism: Secondary | ICD-10-CM | POA: Insufficient documentation

## 2023-05-11 LAB — CMP (CANCER CENTER ONLY)
ALT: 23 U/L (ref 0–44)
AST: 15 U/L (ref 15–41)
Albumin: 4 g/dL (ref 3.5–5.0)
Alkaline Phosphatase: 63 U/L (ref 38–126)
Anion gap: 6 (ref 5–15)
BUN: 29 mg/dL — ABNORMAL HIGH (ref 8–23)
CO2: 27 mmol/L (ref 22–32)
Calcium: 8.9 mg/dL (ref 8.9–10.3)
Chloride: 104 mmol/L (ref 98–111)
Creatinine: 1.05 mg/dL (ref 0.61–1.24)
GFR, Estimated: >60 mL/min (ref >60)
Glucose, Bld: 105 mg/dL — ABNORMAL HIGH (ref 70–99)
Potassium: 4 mmol/L (ref 3.5–5.1)
Sodium: 137 mmol/L (ref 135–145)
Total Bilirubin: 0.5 mg/dL (ref 0.0–1.2)
Total Protein: 5.9 g/dL — ABNORMAL LOW (ref 6.5–8.1)

## 2023-05-11 LAB — CBC WITH DIFFERENTIAL (CANCER CENTER ONLY)
Abs Immature Granulocytes: 0.03 10*3/uL (ref 0.00–0.07)
Basophils Absolute: 0 10*3/uL (ref 0.0–0.1)
Basophils Relative: 0 %
Eosinophils Absolute: 0 10*3/uL (ref 0.0–0.5)
Eosinophils Relative: 1 %
HCT: 32.8 % — ABNORMAL LOW (ref 39.0–52.0)
Hemoglobin: 10.7 g/dL — ABNORMAL LOW (ref 13.0–17.0)
Immature Granulocytes: 0 %
Lymphocytes Relative: 10 %
Lymphs Abs: 0.7 10*3/uL (ref 0.7–4.0)
MCH: 33.9 pg (ref 26.0–34.0)
MCHC: 32.6 g/dL (ref 30.0–36.0)
MCV: 103.8 fL — ABNORMAL HIGH (ref 80.0–100.0)
Monocytes Absolute: 1 10*3/uL (ref 0.1–1.0)
Monocytes Relative: 14 %
Neutro Abs: 5.4 10*3/uL (ref 1.7–7.7)
Neutrophils Relative %: 75 %
Platelet Count: 187 10*3/uL (ref 150–400)
RBC: 3.16 MIL/uL — ABNORMAL LOW (ref 4.22–5.81)
RDW: 13.7 % (ref 11.5–15.5)
WBC Count: 7.2 10*3/uL (ref 4.0–10.5)
nRBC: 0.4 % — ABNORMAL HIGH (ref 0.0–0.2)

## 2023-05-11 LAB — LACTATE DEHYDROGENASE: LDH: 178 U/L (ref 98–192)

## 2023-05-11 MED ORDER — TRAMADOL HCL 50 MG PO TABS: 50.0000 mg | ORAL_TABLET | Freq: Four times a day (QID) | ORAL | 0 refills | Status: DC | PRN

## 2023-05-11 NOTE — Progress Notes (Signed)
 [facility] Telephone:(336) [phone]   [phone]  PROGRESS NOTE  Patient Care Team: Charlott Dorn LABOR, MD as PCP - General (Internal Medicine) Okey Vina GAILS, MD as PCP - Cardiology (Cardiology) Shlomo Wilbert SAUNDERS, MD as PCP - Sleep Medicine (Cardiology) Cindie Ole DASEN, MD as PCP - Electrophysiology (Cardiology) Sheldon Standing, MD as Consulting Physician (General Surgery) Rosalie Kitchens, MD as Consulting Physician (Gastroenterology) Shlomo Wilbert SAUNDERS, MD as Consulting Physician (Sleep Medicine) Tobie Tonita POUR, DO as Consulting Physician (Neurology)  Hematological/Oncological History # IgA Kappa Multiple Myeloma 05/05/2020: Bone marrow biopsy showed increased number of plasma cells representing 14% of all cells in the aspirate associated with numerous variably sized clusters in the clot and biopsy sections 04/10/2021: M protein 0.3, IgA kappa specificity. Kappa 580.5, Labda 7.0, ratio 82.93.   12/07/2021: Labs show of 1971.3, lambda 5.5, ratio 358.42 12/10/2021: Transition care to Dr. Federico. 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  01/26/2022: Cycle 2 Day 1 of Dara/Rev/Dex 02/22/2022: Cycle 3 Day 1 of Dara/Rev/Dex 03/23/2022: Cycle 4 Day 1 of Dara/Rev/Dex 04/20/2022: Cycle 5 Day 1 of Dara/Rev/Dex 05/18/2022: Cycle 6 Day 1 of Dara/Rev/Dex 05/27/2022: MRI cervical/thoracic/lumbar: pathologic fracture at T2 with significant osseous retropulsion and resulting cord compression. Additional smaller lesions within T1 vertebral body, right C7 and T3 articulating facets.  Small Multiple Myeloma lesions scattered at all lumbar levels and in the visible left sacral ala. Largest lumbar tumor is 2.3 cm in the left L1 posterior elements and pedicle. No lumbar extraosseous or epidural tumor extension, or pathologic fracture.Underlying chronic lumbar spine  degeneration, with a new central disc herniation at L3-L4 since 2021 now resulting in mild to moderate degenerative spinal stenosis there. 05/28/2022-06/09/2022: Admitted for T2 pathologic fracture and cord compression. He received urgent radiation therapy for a total of 3.0 Gy. He underwent  thoracic 1 through 4 posterior lateral arthrodesis/fusion and 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  06/23/2022: Cycle 1 Day 1 of Velcade /Pomalyst /Dex 08/04/2022: Cycle 2 Day 1 of Velcade /Pomalyst /Dex. Pomalyst  to be decreased to 2mg  PO daily due to cytopenias.  09/01/2022: Cycle 3 Day 1 of Velcade /Pomalyst /Dex. (Missed 2nd dose of cycle due to hospitalization)  10/06/2022: Cycle 4 Day 1 of Velcade /Pomalyst /Dex.  10/27/2022: Cycle 5 Day 1 of Velcade /Pomalyst /Dex.  12/02/2022: Cycle 6 Day 1 of Velcade /Pomalyst /Dex. 01/12/2023: Cycle 1 Day 1 of Kyprolis /Dex 02/09/2023: Cycle 2 Day 1 of Kyprolis /Dex    Interval History:  Travis [name] 73 y.o. male with medical history significant for IgA Kappa Multiple Myeloma who presents for a follow up visit. He was last seen on 02/09/2023. In the interim, he continued on Kyprolis /Dex therapy.  On exam today [name] reports the CAR-T process through [facility] is going quite well.  He reports that he is scheduled for everything to start on 06/17/2023.  He notes that his main issue at this time is some back pain.  He reports that he is still having some occasional twinges of back pain and unfortunately he accidentally stepped on his wife and twisted his hip and is having pain there as well.  He reports that he does have some tramadol  which he has been taking for the pain as well as occasional Tylenol .  He is hoping for a refill on his tramadol  today.  He reports also he is taking his torsemide  and metolazone  every 3 days but wants to increase it  because he is having increasing swelling in the face and abdomen.  He is not having any shortness of breath  or cough and not as much swelling in his lower extremities.  He denies any infectious symptoms such as runny nose, sore throat, or cough.  He denies fevers, chills, sweats, shortness of breath, chest pain or cough. He has no other complaints. Rest of the 10 point ROS is below.     MEDICAL HISTORY:  Past Medical History:  Diagnosis Date   A-fib [facility])    Arthritis    comes and goes; mostly in hands, occasionally elbow (04/05/2017)   Complication of anesthesia    just wanted to sleep alot  for couple days S/P VARICOCELE OR (04/05/2017)   DVT (deep venous thrombosis) (HCC)    LLE   Dysrhythmia    PVCs    GERD (gastroesophageal reflux disease)    History of hiatal hernia    History of radiation therapy    Thoracic Spine- 07/13/22-07/23/22-Dr. Lynwood Nasuti   Hypertension    Impaired glucose tolerance    Multiple myeloma (HCC)    Obesity (BMI 30-39.9) 07/26/2016   OSA on CPAP 07/26/2016   Mild with AHI 12.5/hr now on CPAP at 14cm H2O   PAF (paroxysmal atrial fibrillation) (HCC)    s/p PVI Ablation in 2018 // Flecainide  Rx // Echo 8/21: EF 60-65, no RWMA, mild LVH, normal RV SF, moderate RVE, mild BAE, trivial MR, mild dilation of aortic root (40 mm)   Pneumonia    may have had walking pneumonia a few years ago (04/05/2017)   Pre-diabetes    Thrombophlebitis     SURGICAL HISTORY: Past Surgical History:  Procedure Laterality Date   ATRIAL FIBRILLATION ABLATION  04/05/2017   ATRIAL FIBRILLATION ABLATION N/A 04/05/2017   Procedure: ATRIAL FIBRILLATION ABLATION;  Surgeon: Kelsie Lynwood, MD;  Location: [facility];  Service: Cardiovascular;  Laterality: N/A;   BONE BIOPSY Right 12/17/2021   Procedure: BONE BIOPSY;  Surgeon: Cristy Bonner DASEN, MD;  Location:  [facility];  Service: Orthopedics;  Laterality: Right;   COMPLETE RIGHT HIP REPLACMENT  01/19/2019   EVALUATION UNDER ANESTHESIA WITH HEMORRHOIDECTOMY N/A 02/24/2017   Procedure: EXAM UNDER ANESTHESIA WITH   HEMORRHOIDECTOMY;  Surgeon: Sheldon Standing, MD;  Location: [facility];  Service: General;  Laterality: N/A;   EXCISIONAL TOTAL HIP ARTHROPLASTY WITH ANTIBIOTIC SPACERS Right 09/25/2020   Procedure: EXCISIONAL TOTAL HIP ARTHROPLASTY WITH ANTIBIOTIC SPACERS;  Surgeon: Ernie Cough, MD;  Location: [facility];  Service: Orthopedics;  Laterality: Right;   FLEXIBLE SIGMOIDOSCOPY N/A 04/15/2015   Procedure:  UNSEDATED FLEXIBLE SIGMOIDOSCOPY;  Surgeon: Gladis MARLA Louder, MD;  Location: [facility];  Service: Endoscopy;  Laterality: N/A;   HUMERUS IM NAIL Right 12/17/2021   Procedure: INTRAMEDULLARY (IM) NAIL HUMERAL;  Surgeon: Cristy Bonner DASEN, MD;  Location: [facility];  Service: Orthopedics;  Laterality: Right;   KNEE ARTHROSCOPY Left ~ 2016   POSTERIOR CERVICAL FUSION/FORAMINOTOMY N/A 06/04/2022   Procedure: Thoracic one - Thoracic Four Posterior lateral arthrodesis/ fusion and Thoracic two Corpectomy;  Surgeon: Gillie Duncans, MD;  Location: [facility];  Service: Neurosurgery;  Laterality: N/A;   REIMPLANTATION OF TOTAL HIP Right 12/25/2020   Procedure: REIMPLANTATION/REVISION OF RIGHT TOTAL HIP VERSUS REPEAT IRRIGATION AND DEBRIDEMENT;  Surgeon: Ernie Cough, MD;  Location: [facility];  Service: Orthopedics;  Laterality: Right;   TESTICLE SURGERY  1988   VARICOCELE   TONSILLECTOMY     VARICOSE VEIN SURGERY Bilateral  SOCIAL HISTORY: Social History   Socioeconomic History   Marital status: Married    Spouse name: Not on file   Number of children: 1   Years of education: law school   Highest education level: Not on file  Occupational History   Occupation: lawyer  Tobacco Use   Smoking status: Never   Smokeless tobacco: Never  Vaping Use   Vaping status: Never Used  Substance and Sexual Activity   Alcohol use: Yes    Alcohol/week: 2.0 standard drinks of alcohol    Types: 2 Glasses of wine per week    Comment: 2 glasses of wine a week, occ bourbon, beer   Drug use: No   Sexual activity: Not  Currently  Other Topics Concern   Not on file  Social History Narrative   Lives in [place] with spouse in a 2 story home.  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 1 daughter.     Education: social worker school.    Social Drivers of Corporate Investment Banker Strain: Not on file  Food Insecurity: No Food Insecurity (09/27/2022)   Hunger Vital Sign    Worried About Running Out of Food in the Last Year: Never true    Ran Out of Food in the Last Year: Never true  Transportation Needs: No Transportation Needs (09/27/2022)   PRAPARE - Administrator, Civil Service (Medical): No    Lack of Transportation (Non-Medical): No  Physical Activity: Not on file  Stress: Not on file  Social Connections: Unknown (09/15/2021)   [facility]   Social Network    Social Network: Not on file  Intimate Partner Violence: Not At Risk (09/27/2022)   Humiliation, Afraid, Rape, and Kick questionnaire    Fear of Current or Ex-Partner: No    Emotionally Abused: No    Physically Abused: No    Sexually Abused: No    FAMILY HISTORY: Family History  Problem Relation Age of Onset   Dementia Mother    Stroke Father    Atrial fibrillation Father    Hypertension Father    Hypertension Paternal Grandfather    Dementia Maternal Grandmother     ALLERGIES:  is allergic to linezolid  and vancomycin .  MEDICATIONS:  Current Outpatient Medications  Medication Sig Dispense Refill   traMADol  (ULTRAM ) 50 MG tablet Take 1 tablet (50 mg total) by mouth every 6 (six) hours as needed. 30 tablet 0   albuterol  (VENTOLIN  HFA) 108 (90 Base) MCG/ACT inhaler Inhale 2 puffs into the lungs every 6 (six) hours as needed for wheezing or shortness of breath. 8 g 0   amiodarone  (PACERONE ) 200 MG tablet Take 200 mg by mouth daily.     calcium  carbonate (TUMS - DOSED IN MG ELEMENTAL CALCIUM ) 500 MG chewable tablet Chew 1 tablet by mouth daily.     cholecalciferol  (VITAMIN D3) 25  MCG (1000 UNIT) tablet Take 1,000 Units by mouth daily.     Cyanocobalamin  (VITAMIN B-12 PO) Take 1,000 mcg by mouth daily.     dexamethasone  (DECADRON ) 4 MG tablet Take 5 tablets (20 mg total) by mouth every 7 (seven) days. Take 5 tabs (20 mg) by mouth once a day in the morning of chemo on Wednesdays- or unless otherwise instructed 20 tablet 3   ELIQUIS  5 MG TABS tablet TAKE ONE TABLET BY MOUTH TWICE A DAY 60 tablet 6   fluticasone  (FLONASE ) 50 MCG/ACT nasal spray Place 1 spray  into both nostrils daily as needed for allergies or rhinitis.     gabapentin  (NEURONTIN ) 300 MG capsule TAKE TWO CAPSULES BY MOUTH TWICE A DAY 360 capsule 1   hydrALAZINE  (APRESOLINE ) 100 MG tablet Take 1 tablet (100 mg total) by mouth 3 (three) times daily. 270 tablet 3   ketoconazole  (NIZORAL ) 2 % cream as needed for irritation.     metolazone  (ZAROXOLYN ) 2.5 MG tablet Take 1 tablet (2.5 mg total) by mouth every other day. (Patient taking differently: Take 2.5 mg by mouth 3 times/day as needed-between meals & bedtime (edema). Taking 1/2 ever other day or every three days)     polyethylene glycol powder (GLYCOLAX /MIRALAX ) 17 GM/SCOOP powder Take 1 g by mouth daily.     pomalidomide  (POMALYST ) 1 MG capsule Take 1 capsule (1 mg total) by mouth daily. [employer] Auth # [id number] Date Obtained 04/28/23 Take 1 capsule daily for 21 days then none for 7 days 21 capsule 0   potassium chloride  SA (KLOR-CON  M) 20 MEQ tablet TAKE TWO TABLETS BY MOUTH ONE TIME DAILY - TAKE AN EXTRA TABLET ON THE DAY YOU TAKE THE METOLAZONE  ( TOTAL) (Patient taking differently: Take 40 mEq by mouth 3 (three) times daily.) 225 tablet 3   psyllium (METAMUCIL) 58.6 % powder Take by mouth at bedtime. 3 tsp daily hs     sacubitril -valsartan  (ENTRESTO ) 49-51 MG Take 1 tablet by mouth 2 (two) times daily.     selinexor  (XPOVIO ) Therapy Pack (60 mg once weekly) Take 1 tablet (60 mg total) by mouth once a week. 4 tablet 0   sodium chloride  (OCEAN) 0.65 % SOLN  nasal spray Place 1 spray into both nostrils as needed for congestion.     spironolactone  (ALDACTONE ) 25 MG tablet Take 1 tablet (25 mg total) by mouth daily.     torsemide  (DEMADEX ) 20 MG tablet Take 2 tablets (40 mg total) by mouth daily. 90 tablet 3   No current facility-administered medications for this visit.    REVIEW OF SYSTEMS:   Constitutional: ( - ) fevers, ( - )  chills , ( - ) night sweats Eyes: ( - ) blurriness of vision, ( - ) double vision, ( - ) watery eyes Ears, nose, mouth, throat, and face: ( - ) mucositis, ( - ) sore throat Respiratory: ( - ) cough, ( - ) dyspnea, ( - ) wheezes Cardiovascular: ( - ) palpitation, ( - ) chest discomfort, ( - ) lower extremity swelling Gastrointestinal:  ( - ) nausea, ( - ) heartburn, ( - ) change in bowel habits Skin: ( - ) abnormal skin rashes Lymphatics: ( - ) new lymphadenopathy, ( - ) easy bruising Neurological: ( - ) numbness, ( - ) tingling, ( - ) new weaknesses Behavioral/Psych: ( - ) mood change, ( - ) new changes  All other systems were reviewed with the patient and are negative.  PHYSICAL EXAMINATION: ECOG PERFORMANCE STATUS: 1 - Symptomatic but completely ambulatory  Vitals:   05/11/23 1016  BP: (!) 130/57  Pulse: (!) 56  Resp: 16  Temp: 98 F (36.7 C)  SpO2: 98%       Filed Weights   05/11/23 1016  Weight: 276 lb 3.2 oz (125.3 kg)    GENERAL: Well-appearing elderly Caucasian male, alert, no distress and comfortable SKIN: no overt abnormalities of the skin.  EYES: conjunctiva are pink and non-injected, sclera clear LUNGS: clear to auscultation and percussion with normal breathing effort HEART: tachycardic &  no murmurs. +  bilateral trace lower extremity edema Musculoskeletal: no cyanosis of digits and no clubbing  PSYCH: alert & oriented x 3, fluent speech NEURO: no focal motor/sensory deficits  LABORATORY DATA:  I have reviewed the data as listed    Latest Ref Rng & Units 05/11/2023    9:33 AM  04/20/2023   10:12 AM 04/07/2023    1:55 PM  CBC  WBC 4.0 - 10.5 K/uL 7.2  7.9  8.7   Hemoglobin 13.0 - 17.0 g/dL 89.2  87.8  87.7   Hematocrit 39.0 - 52.0 % 32.8  35.8  36.6   Platelets 150 - 400 K/uL 187  165  235        Latest Ref Rng & Units 05/11/2023    9:33 AM 04/20/2023   10:12 AM 04/07/2023    1:55 PM  CMP  Glucose 70 - 99 mg/dL 894  93  96   BUN 8 - 23 mg/dL 29  24  30    Creatinine 0.61 - 1.24 mg/dL 8.94  8.88  8.78   Sodium 135 - 145 mmol/L 137  140  140   Potassium 3.5 - 5.1 mmol/L 4.0  3.6  3.2   Chloride 98 - 111 mmol/L 104  104  102   CO2 22 - 32 mmol/L 27  29  30    Calcium  8.9 - 10.3 mg/dL 8.9  8.9  9.4   Total Protein 6.5 - 8.1 g/dL 5.9  6.0  6.0   Total Bilirubin 0.0 - 1.2 mg/dL 0.5  0.4  0.5   Alkaline Phos 38 - 126 U/L 63  73  74   AST 15 - 41 U/L 15  16  18    ALT 0 - 44 U/L 23  19  20      Lab Results  Component Value Date   MPROTEIN Not Observed 04/20/2023   MPROTEIN Not Observed 04/07/2023   MPROTEIN Not Observed 03/09/2023   Lab Results  Component Value Date   KPAFRELGTCHN 390.9 (H) 04/20/2023   KPAFRELGTCHN 523.0 (H) 04/07/2023   KPAFRELGTCHN 270.6 (H) 03/09/2023   LAMBDASER 8.3 04/20/2023   LAMBDASER 2.7 (L) 04/07/2023   LAMBDASER 3.3 (L) 03/09/2023   KAPLAMBRATIO 47.10 (H) 04/20/2023   KAPLAMBRATIO 193.70 (H) 04/07/2023   KAPLAMBRATIO 82.00 (H) 03/09/2023     RADIOGRAPHIC STUDIES: DG Hip Unilat W or W/O Pelvis 1 View Right Result Date: 04/22/2023 CLINICAL DATA:  Right hip pain, multiple myeloma EXAM: DG HIP (WITH OR WITHOUT PELVIS) 1V RIGHT COMPARISON:  CT abdomen/pelvis dated 10/25/2022 FINDINGS: Right hip arthroplasty. Lucency in the right acetabulum, possibly related to known multiple myeloma, although particle disease could have a similar appearance. Left hip joint space is preserved. No fracture or dislocation is seen. Visualized bony pelvis is intact. Lytic lesions in the bilateral pelvis, better evaluated on CT. Suspected small lytic  lesion in the right proximal femur. IMPRESSION: Right hip arthroplasty. Lucency in the right acetabulum, possibly related to known multiple myeloma, although particle disease could have a similar appearance. Lytic lesions in the bilateral pelvis, related to known multiple myeloma, better evaluated on CT. Electronically Signed   By: Pinkie Pebbles M.D.   On: 04/22/2023 00:50    ASSESSMENT & PLAN MUKHTAR SHAMS is a 73 y.o. male with medical history significant for IgA Kappa Multiple Myeloma who presents for a follow up visit.   # IgA Kappa Multiple Myeloma --Metastatic survey completed 12/15/2021 --Patient underwent fixation of his humerus on 12/17/2021, pathology confirmed plasmacytoma/plasma cell neoplasm.  --  Received 6 cycles of Darzalex , Revlimid , and dexamethasone  started on 12/28/2021-05/18/2022.  --Due to worsening myeloma osseous lesions including pathologic fracture at T2 with cord compression, Dr. Federico recommends changing therapy to Velcade /Pomalyst /Dex.  --Due to rising kappa free light chains, recommend to change therapy to Carfilzomib /Dex started on 01/12/2023. Plan: --continuing Selinexor /Pom/Dex per [facility] recommendations in anticipation of CAR-T.  Intending for this to start on 06/17/2023. --labs from today showed white blood cell 7.2, Hgb 10.7, MCV 103.8, Plt 187 --kappa 390.9, Lambda 8.3, K/L ratio 47.1, improved from prior.  --Proceed with treatment without any dose modifications.  --RTC in 2 weeks for labs and 4 weeks clinic visit with interval continued PO treatment     #Neck pain and mid back pain: --Suspect pain is exacerbated from recent hospitalization and being in bed/sitting for long periods.  --MRI C/T/L spine obtinaed that showed unchanged lytic lesions in the right C7 inferior articular process,T6, T8, and T10  vertebral bodies, as well  as the T11 spinous process. Unchanged  multilevel cervical spondylsis, Interval enlargement of disc herniation at L3-L4 with  progressive spinal canal stenosis. No new or progressive lytic lesions.  --Encouraged to continue with physical therapy and follow up with ortho.   #Palliative Radiation Therapy: # Dysphagia --Received palliative radiation from 01/06/2022-01/19/2022 to right hymerus, right forearm and right chest/rib, 20 Gy in 10 Fx.  --recieved palliative radiation to surgical bed in the upper thoracic spine area on 07/13/2022.  27 Gy in 9 Fx.   #Lower extremity edema--improved: --Currently on lasix  and torsemide   # Rash Concerning for Shingles-resolved -- Prescribed valacyclovir  3 times daily 1000 mg x 10 days-completed the course -- We will continue to monitor rash closely.  #DVT: --Found to have acute DVT involving left peroneal veins. Likely secondary to recent surgery and hospitalization --Currently on eliquis  5 mg BID.    #Hypokalemia: --Potassium level is 4.0 today. Advised to continue with 40 mEq twice daily and increase to 60 mEq on the days he takes torsemide /spironolactone   #Rash on scalp and right forearm: --Recommend topical hydrocortisone cream for spot treatment  #Supportive Care -- chemotherapy education complete -- port placement not required -- zofran  8mg  q8H PRN and compazine  10mg  PO q6H for nausea -- acyclovir  400mg  PO BID for VCZ prophylaxis -- Started Zometa  q 12 weeks on 11/10/2022. Next dose due today.   No orders of the defined types were placed in this encounter.  All questions were answered. The patient knows to call the clinic with any problems, questions or concerns.  A total of more than 30 minutes were spent on this encounter with face-to-face time and non-face-to-face time, including preparing to see the patient, ordering tests and/or medications, counseling the patient and coordination of care as outlined above.   Norleen IVAR Federico, MD Department of Hematology/Oncology [facility] at [facility] Phone: [phone] Pager: [phone] Email:  [email or web address]  05/11/2023 4:24 PM

## 2023-05-12 ENCOUNTER — Ambulatory Visit: Payer: Medicare Other

## 2023-05-12 DIAGNOSIS — R293 Abnormal posture: Secondary | ICD-10-CM | POA: Diagnosis not present

## 2023-05-12 DIAGNOSIS — S22000A Wedge compression fracture of unspecified thoracic vertebra, initial encounter for closed fracture: Secondary | ICD-10-CM | POA: Diagnosis not present

## 2023-05-12 DIAGNOSIS — R252 Cramp and spasm: Secondary | ICD-10-CM | POA: Diagnosis not present

## 2023-05-12 DIAGNOSIS — R262 Difficulty in walking, not elsewhere classified: Secondary | ICD-10-CM | POA: Diagnosis not present

## 2023-05-12 DIAGNOSIS — M542 Cervicalgia: Secondary | ICD-10-CM | POA: Diagnosis not present

## 2023-05-12 DIAGNOSIS — C9000 Multiple myeloma not having achieved remission: Secondary | ICD-10-CM

## 2023-05-12 DIAGNOSIS — R29898 Other symptoms and signs involving the musculoskeletal system: Secondary | ICD-10-CM | POA: Diagnosis not present

## 2023-05-12 DIAGNOSIS — M546 Pain in thoracic spine: Secondary | ICD-10-CM | POA: Diagnosis not present

## 2023-05-12 DIAGNOSIS — M6281 Muscle weakness (generalized): Secondary | ICD-10-CM

## 2023-05-12 LAB — KAPPA/LAMBDA LIGHT CHAINS
Kappa free light chain: 212.7 mg/L — ABNORMAL HIGH (ref 3.3–19.4)
Kappa, lambda light chain ratio: 48.34 — ABNORMAL HIGH (ref 0.26–1.65)
Lambda free light chains: 4.4 mg/L — ABNORMAL LOW (ref 5.7–26.3)

## 2023-05-12 NOTE — Therapy (Signed)
 OUTPATIENT PHYSICAL THERAPY NECK AND THORACIC TREATMENT   Patient Name: Travis [name] MRN: [id number] DOB:1951-04-04, 73 y.o., male Today's Date: 05/12/2023  END OF SESSION:  PT End of Session - 05/12/23 1155     Visit Number 7    Date for PT Re-Evaluation 05/20/23    Authorization Type U[employer] MCR    Progress Note Due on Visit 10    PT Start Time 1151    PT Stop Time 1230    PT Time Calculation (min) 39 min    Activity Tolerance Patient tolerated treatment well;Patient limited by pain    Behavior During Therapy [facility] for tasks assessed/performed              Past Medical History:  Diagnosis Date   A-fib (HCC)    Arthritis    comes and goes; mostly in hands, occasionally elbow (04/05/2017)   Complication of anesthesia    just wanted to sleep alot  for couple days S/P VARICOCELE OR (04/05/2017)   DVT (deep venous thrombosis) (HCC)    LLE   Dysrhythmia    PVCs    GERD (gastroesophageal reflux disease)    History of hiatal hernia    History of radiation therapy    Thoracic Spine- 07/13/22-07/23/22-Dr. Lynwood Nasuti   Hypertension    Impaired glucose tolerance    Multiple myeloma (HCC)    Obesity (BMI 30-39.9) 07/26/2016   OSA on CPAP 07/26/2016   Mild with AHI 12.5/hr now on CPAP at 14cm H2O   PAF (paroxysmal atrial fibrillation) (HCC)    s/p PVI Ablation in 2018 // Flecainide  Rx // Echo 8/21: EF 60-65, no RWMA, mild LVH, normal RV SF, moderate RVE, mild BAE, trivial MR, mild dilation of aortic root (40 mm)   Pneumonia    may have had walking pneumonia a few years ago (04/05/2017)   Pre-diabetes    Thrombophlebitis    Past Surgical History:  Procedure Laterality Date   ATRIAL FIBRILLATION ABLATION  04/05/2017   ATRIAL FIBRILLATION ABLATION N/A 04/05/2017   Procedure: ATRIAL FIBRILLATION ABLATION;  Surgeon: Kelsie Lynwood, MD;  Location: [facility];  Service: Cardiovascular;  Laterality: N/A;   BONE BIOPSY Right 12/17/2021   Procedure: BONE BIOPSY;   Surgeon: Cristy Bonner DASEN, MD;  Location: [facility];  Service: Orthopedics;  Laterality: Right;   COMPLETE RIGHT HIP REPLACMENT  01/19/2019   EVALUATION UNDER ANESTHESIA WITH HEMORRHOIDECTOMY N/A 02/24/2017   Procedure: EXAM UNDER ANESTHESIA WITH  HEMORRHOIDECTOMY;  Surgeon: Sheldon Standing, MD;  Location: [facility];  Service: General;  Laterality: N/A;   EXCISIONAL TOTAL HIP ARTHROPLASTY WITH ANTIBIOTIC SPACERS Right 09/25/2020   Procedure: EXCISIONAL TOTAL HIP ARTHROPLASTY WITH ANTIBIOTIC SPACERS;  Surgeon: Ernie Cough, MD;  Location: [facility];  Service: Orthopedics;  Laterality: Right;   FLEXIBLE SIGMOIDOSCOPY N/A 04/15/2015   Procedure:  UNSEDATED FLEXIBLE SIGMOIDOSCOPY;  Surgeon: Gladis MARLA Louder, MD;  Location: [facility];  Service: Endoscopy;  Laterality: N/A;   HUMERUS IM NAIL Right 12/17/2021   Procedure: INTRAMEDULLARY (IM) NAIL HUMERAL;  Surgeon: Cristy Bonner DASEN, MD;  Location: [facility];  Service: Orthopedics;  Laterality: Right;   KNEE ARTHROSCOPY Left ~ 2016   POSTERIOR CERVICAL FUSION/FORAMINOTOMY N/A 06/04/2022   Procedure: Thoracic one - Thoracic Four Posterior lateral arthrodesis/ fusion and Thoracic two Corpectomy;  Surgeon: Gillie Duncans, MD;  Location: [facility];  Service: Neurosurgery;  Laterality: N/A;   REIMPLANTATION OF TOTAL HIP Right 12/25/2020   Procedure: REIMPLANTATION/REVISION OF RIGHT TOTAL HIP  VERSUS REPEAT IRRIGATION AND DEBRIDEMENT;  Surgeon: Ernie Cough, MD;  Location: [facility];  Service: Orthopedics;  Laterality: Right;   TESTICLE SURGERY  1988   VARICOCELE   TONSILLECTOMY     VARICOSE VEIN SURGERY Bilateral    Patient Active Problem List   Diagnosis Date Noted   Pancreatic cyst 09/30/2022   Infection due to novel influenza A virus 09/29/2022   Persistent atrial fibrillation (HCC) 07/15/2022   Acute on chronic diastolic heart failure (HCC) 07/03/2022   Pulmonary infiltrates 07/01/2022   Acute respiratory failure with hypoxia (HCC)  07/01/2022   Hyponatremia 06/29/2022   History of thoracic surgery 06/29/2022   Acute DVT of left tibial vein (HCC) 06/21/2022   Hypokalemia 06/21/2022   Vitamin D  deficiency 05/30/2022   Other constipation 05/29/2022   Pathologic compression fracture of spine, initial encounter (HCC) 05/29/2022   Cord compression (HCC) 05/28/2022   Degenerative cervical disc 05/14/2022   Hypercoagulable state due to persistent atrial fibrillation (HCC) 01/11/2022   Multiple myeloma (HCC) 12/10/2021   Medication monitoring encounter 02/27/2021   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   Status post peripherally inserted central catheter (PICC) central line placement 11/06/2020   Drug rash with eosinophilia and systemic symptoms syndrome 11/06/2020   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   PAF (paroxysmal atrial fibrillation) (HCC)    GERD (gastroesophageal reflux disease) 03/29/2018   Hiatal hernia 03/29/2018   History of DVT (deep vein thrombosis) 03/29/2018   CAP (community acquired pneumonia) 03/29/2018   Osteoarthritis 03/29/2018   Neuropathy 08/29/2017   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   Paroxysmal atrial fibrillation with rapid ventricular response (HCC) 04/05/2017   Prolapsed internal hemorrhoids, grade 4, s/p ligation/pexy/hemorrhoidectomy x 2 02/24/2017 02/24/2017   OSA (obstructive sleep apnea) 07/26/2016   Obesity (BMI 30-39.9) 07/26/2016   Atrial fibrillation with RVR (HCC)    Essential hypertension    Chest pain at rest     PCP: Charlott Dorn LABOR, MD   REFERRING PROVIDER: Federico Norleen ONEIDA MADISON, MD   REFERRING DIAG: C90.00 (ICD-10-CM) - Multiple myeloma not having achieved remission (HCC)   THERAPY DIAG:  Muscle weakness (generalized)  Pain in thoracic spine  Cramp and spasm  Difficulty in walking, not elsewhere classified  Abnormal posture  Multiple myeloma not having achieved remission [facility])  Rationale for Evaluation and Treatment:  Rehabilitation  ONSET DATE: Feb 2024   SUBJECTIVE:   SUBJECTIVE STATEMENT: Patient reports he was a little sore from last session.  He feels like he is just sore from fluid overload.  He confirms he does take a diuretic and has been urinating a lot but still feels like he needs to get a lot more off.  He rates his pain at 4/10 today.   He has a 7 hour B protein infusion tomorrow that he'll have to sit for.    PERTINENT HISTORY: significant for IgA Kappa Multiple Myeloma. He was diagnosed with smouldering myeloma in 2018 . July 2023 he developed right arm pain due to a pathologic fx and he underwent radiation and fixation. In early Feb 2024 he had radiation for pathologic T2 compression fx followed by a T1-T4 fusion.  PAIN:  Are you having pain? Yes: NPRS scale: 7/10 Pain location: R arm and upper traps; between shoulder blades, some low back  Pain description: constant tightness.soreness Aggravating factors: not sure Relieving factors: shoulder rolls, aquatic PT. Hot tub  PRECAUTIONS: Other: Multiple Myeloma with mets to R ribs, R humerus with ORIF for  pathological fracture 12/17/21. Lesions have also been found in R rib and also in his skull. Pathologic fx T2 with fusion T1-4 on 06/04/22, Bilateral LE edema (on Lasix ), Afib   RED FLAGS: None   WEIGHT BEARING RESTRICTIONS: No  FALLS:  Has patient fallen in last 6 months? Yes. Number of falls 1 walking to fast and tripped over feet  LIVING ENVIRONMENT: Lives with: lives with their family and lives with their spouse Lives in: House/apartment Stairs: Yes; Internal: 14 steps; on left going up and External: 3 steps; on right going up Has following equipment at home: Single point cane and Walker - 4 wheeled  OCCUPATION: semi-retired, attorney  PLOF: Independent with household mobility with device  PATIENT GOALS: stretch these muscles out  NEXT MD VISIT: 12/4  OBJECTIVE:  Note: Objective measures were completed at Evaluation unless  otherwise noted.  DIAGNOSTIC FINDINGS: see chart  PATIENT SURVEYS:  NDI 15 / 50 = 30.0 %  COGNITION: Overall cognitive status: Within functional limits for tasks assessed     SENSATION: Some tingling in his hands and feet   POSTURE: rounded shoulders, forward head, and increased thoracic kyphosis Sits in significant forward head, thoracic kyphosis  PALPATION: Marked tightness in B cervical paraspinals, levator, UT  CERVICAL ROM: * pain  Active ROM A/PROM (deg) eval  Flexion full  Extension 4 deg  Right lateral flexion 17  Left lateral flexion 13*  Right rotation 38  Left rotation 42   (Blank rows = not tested)   SHOULDER AROM (tested in sitting): Flex  R 102 deg, L 123 deg ABD R 88 deg, L 124 deg  Functional IR R thumb to L5, L to T10 Functional ER - can reach behind head easily L, must bend neck forward R  SHOULDER STRENGTH: Flex R 4+/5, ABD R 4/5  else 5/5 B    TODAY'S TREATMENT:                                                                                                                              DATE:   05/12/23 Nustep x 8 min level 3 Seated hamstring stretch 3 x 30 sec each LE Sit to stand x 10 lightly with 5 lb kb Seated clam with red loop x 20 Seated mini sit up with 10 lb kb x 20 Seated hamstring curl with green band x 20 each LE Seated bilateral shoulder ER with green band 2 x 10 Seated bilateral shoulder horizontal abduction with green band 2 x 10 Side lunge adductor stretch at steps 2 x 20 sec each Lateral band walks with blue loop x 5 laps at barre Seated thoracic stretch in chair with ball behind the back x 10 Doorway stretch x 5 holding 10 sec each  05/10/23 Nustep x 8 min level 3 Standing hamstring stretch 3 x 20 sec each LE Standing quad/hip flexor (lunge position on steps) stretch 3 x 20 sec each LE Side lunge adductor  stretch at steps 2 x 20 sec each Seated clam with yellow loop x 20 Sit to stand x 10 lightly using hands Lateral band  walks with blue loop x 5 laps at barre Seated hamstring curl with green band x 20 each LE Seated mini sit up with 10 lb kb Seated fwd press with 5 lb dumbbells 2 x 10 Seated overhead press with 5 lb dumbbells 2 x 10 Seated row with green band with handles (PT holding other end of band) Seated thoracic stretch in chair with ball behind the back x 10 Seated bilateral shoulder ER with green band 2 x 10 Seated bilateral shoulder horizontal abduction with green band 2 x 10   05/03/23 Nustep x 5 min level 3 Standing hamstring stretch 3 x 30 sec each LE Standing quad/hip flexor (lunge position on steps) stretch 3 x 20 sec each LE Side lunge adductor stretch at steps 3 x 20 sec each Seated row with green band with handles (PT holding other end of band) Seated thoracic stretch over back of chair x 10 Seated thoracic stretch in chair with ball behind the back x 10 Seated bilateral shoulder ER with green band 2 x 10 Standing facing wall arm raises x 10 circling in  then x 10 circling out Standing doorway stretch x 5 holding 10 sec each  03/25/23 See pt ed and HEP   PATIENT EDUCATION:  Education details: PT eval findings, anticipated POC, initial HEP, and role of DN  Person educated: Patient Education method: Explanation, Demonstration, and Handouts Education comprehension: verbalized understanding and returned demonstration  HOME EXERCISE PROGRAM: Access Code: [email or web address] URL: [email or web address] Date: 03/25/2023 Prepared by: Mliss  Exercises - Seated Cervical Rotation AROM  - 1 x daily - 7 x weekly - 1 sets - 10 reps - 10 hold - Seated Cervical Sidebending AROM  - 1 x daily - 7 x weekly - 1 sets - 10 reps - 5 hold - Seated Cervical Extension AROM  - 1 x daily - 7 x weekly - 1 sets - 10 reps - 10 hold - Seated Scapular Retraction  - 1 x daily - 7 x weekly - 1-3 sets - 10 reps - 2-3 sec hold - Sidelying Thoracic Rotation with Open Book  - 1 x daily - 7 x weekly - 2 sets -  5 reps - Seated Thoracic Extension Arms Overhead  - 1 x daily - 7 x weekly - 1-2 sets - 10 reps - 2-3 sec hold - Seated Trunk Rotation  - 1 x daily - 7 x weekly - 1-2 sets - 10 reps - 2-3 sec hold - Seated Diagonal Chops with Medicine Ball  - 1 x daily - 7 x weekly - 1-2 sets - 10 reps - 2-3 hold  ASSESSMENT:  CLINICAL IMPRESSION: Zell is progressing appropriately.  He is tolerating more resistance and functional strengthening this week.  He was able to do sit to stand without hands today, using a 5 lb kb.   He would benefit from continuing skilled PT for postural strengthening and functional mobility.    OBJECTIVE IMPAIRMENTS: decreased activity tolerance, decreased ROM, decreased strength, increased muscle spasms, impaired flexibility, impaired UE functional use, postural dysfunction, and pain.   ACTIVITY LIMITATIONS: lifting, sleeping, bathing, dressing, and reach over head  PARTICIPATION LIMITATIONS: driving and golf  PERSONAL FACTORS: Fitness, Past/current experiences, and 3+ comorbidities: multiple myeloma, previous fractures  are also affecting patient's functional outcome.   REHAB POTENTIAL: Good  CLINICAL DECISION MAKING:  Evolving/moderate complexity  EVALUATION COMPLEXITY: Moderate   GOALS: Goals reviewed with patient? Yes  SHORT TERM GOALS: Target date: 04/22/2023   Patient will be independent with initial HEP.  Baseline:  Goal status: MET 05/03/23 2.  Improved mobility in thoracic spine by 50% with exercises.  Baseline:  Goal status: MET 05/03/23   LONG TERM GOALS: Target date: 05/20/2023   Patient will be independent with advanced/ongoing HEP to improve outcomes and carryover.  Baseline:  Goal status: INITIAL  2.  Patient will report 50% improvement in neck and upper back pain to improve QOL.  Baseline:  Goal status: INITIAL  3.  Patient will demonstrate improved cervical extension to 10 deg Baseline:  Goal status:INITIAL  4.  Patient will demonstrate  improved upper back strength as evidenced by improved sitting and standing posture. Baseline:  Goal status:INITIAL  5.  Patient will report 7/50 on NDI to demonstrate improved functional ability.  Baseline:  Goal status: INITIAL   PLAN:  PT FREQUENCY: 2x/week  PT DURATION: 8 weeks  PLANNED INTERVENTIONS: 97164- PT Re-evaluation, 97110-Therapeutic exercises, 97530- Therapeutic activity, [id number]- Neuromuscular re-education, 97535- Self Care, [id number]- Manual therapy, [id number]- Aquatic Therapy, 97014- Electrical stimulation (unattended), Patient/Family education, Taping, Dry Needling, Cryotherapy, and Moist heat  PLAN FOR NEXT SESSION: No mobilizations due to previous fractures, monitor right hip status, work on thoracic and cervical mobility,  postural strength, hip mobility.      Delon B. Onnie Hatchel, PT 05/12/23 12:47 PM [facility] [address] [place], [place] [address] Phone # [phone] Fax [phone]

## 2023-05-13 ENCOUNTER — Inpatient Hospital Stay: Payer: Medicare Other

## 2023-05-13 ENCOUNTER — Ambulatory Visit (HOSPITAL_BASED_OUTPATIENT_CLINIC_OR_DEPARTMENT_OTHER): Payer: Medicare Other | Admitting: Physician Assistant

## 2023-05-13 ENCOUNTER — Other Ambulatory Visit: Payer: Self-pay | Admitting: Hematology and Oncology

## 2023-05-13 VITALS — BP 155/81 | HR 57 | Resp 18

## 2023-05-13 VITALS — BP 145/78 | HR 60 | Temp 98.1°F | Resp 18

## 2023-05-13 DIAGNOSIS — R0602 Shortness of breath: Secondary | ICD-10-CM | POA: Diagnosis not present

## 2023-05-13 DIAGNOSIS — M546 Pain in thoracic spine: Secondary | ICD-10-CM | POA: Diagnosis not present

## 2023-05-13 DIAGNOSIS — Z79899 Other long term (current) drug therapy: Secondary | ICD-10-CM | POA: Diagnosis not present

## 2023-05-13 DIAGNOSIS — G4733 Obstructive sleep apnea (adult) (pediatric): Secondary | ICD-10-CM | POA: Diagnosis not present

## 2023-05-13 DIAGNOSIS — M533 Sacrococcygeal disorders, not elsewhere classified: Secondary | ICD-10-CM | POA: Diagnosis not present

## 2023-05-13 DIAGNOSIS — R600 Localized edema: Secondary | ICD-10-CM | POA: Diagnosis not present

## 2023-05-13 DIAGNOSIS — Z8249 Family history of ischemic heart disease and other diseases of the circulatory system: Secondary | ICD-10-CM | POA: Diagnosis not present

## 2023-05-13 DIAGNOSIS — R0781 Pleurodynia: Secondary | ICD-10-CM | POA: Diagnosis not present

## 2023-05-13 DIAGNOSIS — Z881 Allergy status to other antibiotic agents status: Secondary | ICD-10-CM | POA: Diagnosis not present

## 2023-05-13 DIAGNOSIS — C9000 Multiple myeloma not having achieved remission: Secondary | ICD-10-CM

## 2023-05-13 DIAGNOSIS — T50905A Adverse effect of unspecified drugs, medicaments and biological substances, initial encounter: Secondary | ICD-10-CM | POA: Diagnosis not present

## 2023-05-13 DIAGNOSIS — Z823 Family history of stroke: Secondary | ICD-10-CM | POA: Diagnosis not present

## 2023-05-13 DIAGNOSIS — R21 Rash and other nonspecific skin eruption: Secondary | ICD-10-CM | POA: Diagnosis not present

## 2023-05-13 DIAGNOSIS — I480 Paroxysmal atrial fibrillation: Secondary | ICD-10-CM | POA: Diagnosis not present

## 2023-05-13 DIAGNOSIS — K219 Gastro-esophageal reflux disease without esophagitis: Secondary | ICD-10-CM | POA: Diagnosis not present

## 2023-05-13 DIAGNOSIS — Z86718 Personal history of other venous thrombosis and embolism: Secondary | ICD-10-CM | POA: Diagnosis not present

## 2023-05-13 DIAGNOSIS — Z79624 Long term (current) use of inhibitors of nucleotide synthesis: Secondary | ICD-10-CM | POA: Diagnosis not present

## 2023-05-13 DIAGNOSIS — I10 Essential (primary) hypertension: Secondary | ICD-10-CM | POA: Diagnosis not present

## 2023-05-13 DIAGNOSIS — M5126 Other intervertebral disc displacement, lumbar region: Secondary | ICD-10-CM | POA: Diagnosis not present

## 2023-05-13 DIAGNOSIS — E876 Hypokalemia: Secondary | ICD-10-CM | POA: Diagnosis not present

## 2023-05-13 DIAGNOSIS — Z7901 Long term (current) use of anticoagulants: Secondary | ICD-10-CM | POA: Diagnosis not present

## 2023-05-13 DIAGNOSIS — M48061 Spinal stenosis, lumbar region without neurogenic claudication: Secondary | ICD-10-CM | POA: Diagnosis not present

## 2023-05-13 DIAGNOSIS — R1310 Dysphagia, unspecified: Secondary | ICD-10-CM | POA: Diagnosis not present

## 2023-05-13 DIAGNOSIS — Z9089 Acquired absence of other organs: Secondary | ICD-10-CM | POA: Diagnosis not present

## 2023-05-13 MED ORDER — MORPHINE SULFATE (PF) 2 MG/ML IV SOLN
INTRAVENOUS | Status: AC
  Administered 2023-05-13: 2 mg via INTRAVENOUS
  Filled 2023-05-13: qty 1

## 2023-05-13 MED ORDER — FAMOTIDINE IN NACL 20-0.9 MG/50ML-% IV SOLN
20.0000 mg | Freq: Once | INTRAVENOUS | Status: AC
  Administered 2023-05-13: 20 mg via INTRAVENOUS

## 2023-05-13 MED ORDER — SODIUM CHLORIDE 0.9 % IV SOLN
INTRAVENOUS | Status: DC
Start: 2023-05-13 — End: 2023-05-13

## 2023-05-13 MED ORDER — IMMUNE GLOBULIN (HUMAN) 20 GM/200ML IV SOLN
1.0000 g/kg | Freq: Once | INTRAVENOUS | Status: DC
Start: 2023-05-13 — End: 2023-05-13

## 2023-05-13 MED ORDER — DEXTROSE 5 % IV SOLN
INTRAVENOUS | Status: DC
Start: 2023-05-13 — End: 2023-05-13

## 2023-05-13 MED ORDER — MORPHINE SULFATE (PF) 2 MG/ML IV SOLN: 2.0000 mg | Freq: Once | INTRAVENOUS | Status: AC

## 2023-05-13 MED ORDER — OCTAGAM 10 GM/100ML IV SOLN
1.0000 g/kg | Freq: Once | INTRAVENOUS | Status: AC
Start: 2023-05-13 — End: 2023-05-13
  Administered 2023-05-13: 120 g via INTRAVENOUS
  Filled 2023-05-13: qty 1200

## 2023-05-13 MED ORDER — SODIUM CHLORIDE 0.9 % IV SOLN: INTRAVENOUS | Status: DC

## 2023-05-13 MED ORDER — METHYLPREDNISOLONE SODIUM SUCC 125 MG IJ SOLR
125.0000 mg | Freq: Once | INTRAMUSCULAR | Status: AC
  Administered 2023-05-13 (×2): 62.5 mg via INTRAVENOUS

## 2023-05-13 MED ORDER — IMMUNE GLOBULIN (HUMAN) 5 GM/100ML IV SOLN: 1.0000 g/kg | Freq: Once | INTRAVENOUS | Status: DC

## 2023-05-13 MED ORDER — ACETAMINOPHEN 325 MG PO TABS
650.0000 mg | ORAL_TABLET | Freq: Once | ORAL | Status: AC
  Administered 2023-05-13: 650 mg via ORAL
  Filled 2023-05-13: qty 2

## 2023-05-13 MED ORDER — DIPHENHYDRAMINE HCL 50 MG/ML IJ SOLN
25.0000 mg | Freq: Once | INTRAMUSCULAR | Status: AC
  Administered 2023-05-13: 25 mg via INTRAVENOUS

## 2023-05-13 MED ORDER — ZOLEDRONIC ACID 4 MG/100ML IV SOLN
4.0000 mg | Freq: Once | INTRAVENOUS | Status: AC
  Administered 2023-05-13: 4 mg via INTRAVENOUS
  Filled 2023-05-13: qty 100

## 2023-05-13 NOTE — Progress Notes (Signed)
    DATE:  05/13/23                                        X CHEMO/IMMUNOTHERAPY REACTION          MD: Federico   AGENT/BLOOD PRODUCT RECEIVING TODAY:              Octagam   AGENT/BLOOD PRODUCT RECEIVING IMMEDIATELY PRIOR TO REACTION:         Octagam   Vitals:   05/13/23 1133 05/13/23 1147 05/13/23 1200  BP: (!) 160/81 (!) 159/89 (!) 155/81  Pulse: (!) 55 (!) 57 (!) 57  Resp: 18 18 18   SpO2: 100% 100% 100%      REACTION(S):           arthralgias, rigors, SOB   PREMEDS:     Tylenol  650 mg PO   INTERVENTION: Pepcid  20 mg IV, solu-medrol  125 mg IV, benadryl  25 mg IV, morphine  4 mg IV, 500 ml NS   Review of Systems  Review of Systems  Respiratory:  Positive for shortness of breath.   Musculoskeletal:  Positive for arthralgias.  Neurological:  Positive for tremors.  All other systems reviewed and are negative.    Physical Exam  Physical Exam Vitals and nursing note reviewed.  Constitutional:      Appearance: He is not ill-appearing or toxic-appearing.     Comments: rigors  HENT:     Head: Normocephalic.  Eyes:     Conjunctiva/sclera: Conjunctivae normal.  Cardiovascular:     Rate and Rhythm: Normal rate and regular rhythm.     Pulses: Normal pulses.     Heart sounds: Normal heart sounds.  Pulmonary:     Effort: Pulmonary effort is normal.     Breath sounds: Normal breath sounds.  Abdominal:     General: There is no distension.  Musculoskeletal:     Cervical back: Normal range of motion.  Skin:    General: Skin is warm and dry.  Neurological:     Mental Status: He is alert.     OUTCOME:                Patient became symptomatic approximately 1 hour minutes into first treatment of octagam with arthralgias, rigors, and shortness of breath. Emergency medications were administered as documented above. Patient returned to baseline. Oncologist notified and agrees with plan to discontinue treatment based on severity of reaction. Patient monitored until he returned to  baseline. Discussed neutropenic precautions.  I have spent a total of 30 minutes minutes of face-to-face and non-face-to-face time preparing to see the patient, performing a medically appropriate examination, counseling and educating the patient, ordering tests/procedures/medications, documenting clinical information in the electronic health record, and care coordination.

## 2023-05-13 NOTE — Progress Notes (Signed)
 Hypersensitivity Reaction note  Date of event: 05/13/23 Time of event: 1116 Generic name of drug involved: Immunoglobulin (Privigen  10%) Name of provider notified of the hypersensitivity reaction: Dr. Federico and Mallie Combes, PA Was agent that likely caused hypersensitivity reaction added to Allergies List within EMR? Yes Chain of events including reaction signs/symptoms, treatment administered, and outcome (e.g., drug resumed; drug discontinued; sent to Emergency Department; etc.) Patient was started on his dextrose  carrier fluid, given tylenol  650mg  PO and given his 30 minute wait. Patient tolerated the IVIG well for the first three titrations. At 1116 the patient started to complain of intense bilateral hip pain 9/10. Infusion was stopped. Mallie Combes PA symptom management was called. Order to give th patient 20mg  of pepcid . IVF bolus was started- dextrose . Patient pain started to run up his back and he started to shake. Mallie arrived- 25mg  of IV benadryl  given. VS as charted- patient was put on oxygen. He was moderately flushed and mildly SOB. 62.5mg  of solumedrol was given. 2mg  IV morphine  given. Patient started to feel bettr- still shaking. BP high. After ten minutes and patient rigors continued- 2mg  more IV morphine  given. Slowly patient began to feel better. Patient received 250ml of dextrose  and 250ml of saline bolus- no wheezing (can take lasix  later tonight per pt.) Oxygen was removed and patient had never desaturated. Patient went to the BR with asst, ate a snack and drank some water . Wife came to pick him up after he was able to walk (with cane baseline) to the lobby.  Almarie JAYSON Rogers, RN 05/13/2023 2:19 PM

## 2023-05-13 NOTE — Patient Instructions (Addendum)
 `Immune Globulin  Injection What is this medication? IMMUNE GLOBULIN  (im MUNE  GLOB yoo lin) helps to prevent or reduce the severity of certain infections in patients who are at risk. This medicine is collected from the pooled blood of many donors. It is used to treat immune system problems, thrombocytopenia, and Kawasaki syndrome. This medicine may be used for other purposes; ask your health care provider or pharmacist if you have questions. This medicine may be used for other purposes; ask your health care provider or pharmacist if you have questions. COMMON BRAND NAME(S): ASCENIV, Baygam, BIVIGAM, Carimune, Carimune NF, cutaquig, Cuvitru, Flebogamma, Flebogamma DIF, GamaSTAN, GamaSTAN S/D, Gamimune N, Gammagard , Gammagard  S/D, Gammaked, Gammaplex, Gammar-P IV, Gamunex, Gamunex-C, Hizentra, Iveegam, Iveegam EN, Octagam, Panglobulin, Panglobulin NF, panzyga, Polygam S/D, Privigen , Sandoglobulin, Venoglobulin-S, Vigam, Vivaglobulin, Xembify What should I tell my care team before I take this medication? They need to know if you have any of these conditions: diabetes extremely low or no immune antibodies in the blood heart disease history of blood clots hyperprolinemia infection in the blood, sepsis kidney disease recently received or scheduled to receive a vaccination an unusual or allergic reaction to human immune globulin , albumin , maltose, sucrose, other medicines, foods, dyes, or preservatives pregnant or trying to get pregnant breast-feeding How should I use this medication? This medicine is for injection into a muscle or infusion into a vein or skin. It is usually given by a health care professional in a hospital or clinic setting. In rare cases, some brands of this medicine might be given at home. You will be taught how to give this medicine. Use exactly as directed. Take your medicine at regular intervals. Do not take your medicine more often than directed. Talk to your pediatrician  regarding the use of this medicine in children. While this drug may be prescribed for selected conditions, precautions do apply. Overdosage: If you think you have taken too much of this medicine contact a poison control center or emergency room at once. NOTE: This medicine is only for you. Do not share this medicine with others. Overdosage: If you think you have taken too much of this medicine contact a poison control center or emergency room at once. NOTE: This medicine is only for you. Do not share this medicine with others. What if I miss a dose? It is important not to miss your dose. Call your doctor or health care professional if you are unable to keep an appointment. If you give yourself the medicine and you miss a dose, take it as soon as you can. If it is almost time for your next dose, take only that dose. Do not take double or extra doses. What may interact with this medication? aspirin  and aspirin -like medicines cisplatin cyclosporine medicines for infection like acyclovir , adefovir, amphotericin B, bacitracin , cidofovir, foscarnet, ganciclovir, gentamicin, pentamidine, vancomycin  NSAIDS, medicines for pain and inflammation, like ibuprofen or naproxen pamidronate vaccines zoledronic  acid This list may not describe all possible interactions. Give your health care provider a list of all the medicines, herbs, non-prescription drugs, or dietary supplements you use. Also tell them if you smoke, drink alcohol, or use illegal drugs. Some items may interact with your medicine. This list may not describe all possible interactions. Give your health care provider a list of all the medicines, herbs, non-prescription drugs, or dietary supplements you use. Also tell them if you smoke, drink alcohol, or use illegal drugs. Some items may interact with your medicine. What should I watch for while using this medication?  Your condition will be monitored carefully while you are receiving this  medicine. This medicine is made from pooled blood donations of many different people. It may be possible to pass an infection in this medicine. However, the donors are screened for infections and all products are tested for HIV and hepatitis. The medicine is treated to kill most or all bacteria and viruses. Talk to your doctor about the risks and benefits of this medicine. Do not have vaccinations for at least 14 days before, or until at least 3 months after receiving this medicine. What side effects may I notice from receiving this medication? Side effects that you should report to your doctor or health care professional as soon as possible: allergic reactions like skin rash, itching or hives, swelling of the face, lips, or tongue blue colored lips or skin breathing problems chest pain or tightness fever signs and symptoms of aseptic meningitis such as stiff neck; sensitivity to light; headache; drowsiness; fever; nausea; vomiting; rash signs and symptoms of a blood clot such as chest pain; shortness of breath; pain, swelling, or warmth in the leg signs and symptoms of hemolytic anemia such as fast heartbeat; tiredness; dark yellow or brown urine; or yellowing of the eyes or skin signs and symptoms of kidney injury like trouble passing urine or change in the amount of urine sudden weight gain swelling of the ankles, feet, hands Side effects that usually do not require medical attention (report to your doctor or health care professional if they continue or are bothersome): diarrhea flushing headache increased sweating joint pain muscle cramps muscle pain nausea pain, redness, or irritation at site where injected tiredness This list may not describe all possible side effects. Call your doctor for medical advice about side effects. You may report side effects to FDA at 1-800-FDA-1088. This list may not describe all possible side effects. Call your doctor for medical advice about side  effects. You may report side effects to FDA at 1-800-FDA-1088. Where should I keep my medication? Keep out of the reach of children. This drug is usually given in a hospital or clinic and will not be stored at home. In rare cases, some brands of this medicine may be given at home. If you are using this medicine at home, you will be instructed on how to store this medicine. Throw away any unused medicine after the expiration date on the label. NOTE: This sheet is a summary. It may not cover all possible information. If you have questions about this medicine, talk to your doctor, pharmacist, or health care provider.  2024 Elsevier/Gold Standard (2018-11-22 00:00:00)  Zoledronic  Acid Injection (Cancer) What is this medication? ZOLEDRONIC  ACID (ZOE le dron ik AS id) treats high calcium  levels in the blood caused by cancer. It may also be used with chemotherapy to treat weakened bones caused by cancer. It works by slowing down the release of calcium  from bones. This lowers calcium  levels in your blood. It also makes your bones stronger and less likely to break (fracture). It belongs to a group of medications called bisphosphonates. This medicine may be used for other purposes; ask your health care provider or pharmacist if you have questions. COMMON BRAND NAME(S): Zometa , Zometa  Powder What should I tell my care team before I take this medication? They need to know if you have any of these conditions: Dehydration Dental disease Kidney disease Liver disease Low levels of calcium  in the blood Lung or breathing disease, such as asthma Receiving steroids, such as dexamethasone   or prednisone  An unusual or allergic reaction to zoledronic  acid, other medications, foods, dyes, or preservatives Pregnant or trying to get pregnant Breast-feeding How should I use this medication? This medication is injected into a vein. It is given by your care team in a hospital or clinic setting. Talk to your care team  about the use of this medication in children. Special care may be needed. Overdosage: If you think you have taken too much of this medicine contact a poison control center or emergency room at once. NOTE: This medicine is only for you. Do not share this medicine with others. What if I miss a dose? Keep appointments for follow-up doses. It is important not to miss your dose. Call your care team if you are unable to keep an appointment. What may interact with this medication? Certain antibiotics given by injection Diuretics, such as bumetanide, furosemide  NSAIDs, medications for pain and inflammation, such as ibuprofen or naproxen Teriparatide Thalidomide This list may not describe all possible interactions. Give your health care provider a list of all the medicines, herbs, non-prescription drugs, or dietary supplements you use. Also tell them if you smoke, drink alcohol, or use illegal drugs. Some items may interact with your medicine. What should I watch for while using this medication? Visit your care team for regular checks on your progress. It may be some time before you see the benefit from this medication. Some people who take this medication have severe bone, joint, or muscle pain. This medication may also increase your risk for jaw problems or a broken thigh bone. Tell your care team right away if you have severe pain in your jaw, bones, joints, or muscles. Tell you care team if you have any pain that does not go away or that gets worse. Tell your dentist and dental surgeon that you are taking this medication. You should not have major dental surgery while on this medication. See your dentist to have a dental exam and fix any dental problems before starting this medication. Take good care of your teeth while on this medication. Make sure you see your dentist for regular follow-up appointments. You should make sure you get enough calcium  and vitamin D  while you are taking this medication.  Discuss the foods you eat and the vitamins you take with your care team. Check with your care team if you have severe diarrhea, nausea, and vomiting, or if you sweat a lot. The loss of too much body fluid may make it dangerous for you to take this medication. You may need bloodwork while taking this medication. Talk to your care team if you wish to become pregnant or think you might be pregnant. This medication can cause serious birth defects. What side effects may I notice from receiving this medication? Side effects that you should report to your care team as soon as possible: Allergic reactions--skin rash, itching, hives, swelling of the face, lips, tongue, or throat Kidney injury--decrease in the amount of urine, swelling of the ankles, hands, or feet Low calcium  level--muscle pain or cramps, confusion, tingling, or numbness in the hands or feet Osteonecrosis of the jaw--pain, swelling, or redness in the mouth, numbness of the jaw, poor healing after dental work, unusual discharge from the mouth, visible bones in the mouth Severe bone, joint, or muscle pain Side effects that usually do not require medical attention (report to your care team if they continue or are bothersome): Constipation Fatigue Fever Loss of appetite Nausea Stomach pain This list may not  describe all possible side effects. Call your doctor for medical advice about side effects. You may report side effects to FDA at 1-800-FDA-1088. Where should I keep my medication? This medication is given in a hospital or clinic. It will not be stored at home. NOTE: This sheet is a summary. It may not cover all possible information. If you have questions about this medicine, talk to your doctor, pharmacist, or health care provider.  2024 Elsevier/Gold Standard (2021-06-12 00:00:00)

## 2023-05-13 NOTE — Progress Notes (Signed)
 Per Dr. Leonides Schanz ok to give zometa today with a calcium of 8.9.

## 2023-05-17 ENCOUNTER — Encounter: Payer: Self-pay | Admitting: Rehabilitative and Restorative Service Providers"

## 2023-05-17 ENCOUNTER — Ambulatory Visit: Payer: Medicare Other | Admitting: Rehabilitative and Restorative Service Providers"

## 2023-05-17 DIAGNOSIS — M6281 Muscle weakness (generalized): Secondary | ICD-10-CM | POA: Diagnosis not present

## 2023-05-17 DIAGNOSIS — M546 Pain in thoracic spine: Secondary | ICD-10-CM | POA: Diagnosis not present

## 2023-05-17 DIAGNOSIS — R29898 Other symptoms and signs involving the musculoskeletal system: Secondary | ICD-10-CM

## 2023-05-17 DIAGNOSIS — M542 Cervicalgia: Secondary | ICD-10-CM

## 2023-05-17 DIAGNOSIS — R293 Abnormal posture: Secondary | ICD-10-CM | POA: Diagnosis not present

## 2023-05-17 DIAGNOSIS — S22000A Wedge compression fracture of unspecified thoracic vertebra, initial encounter for closed fracture: Secondary | ICD-10-CM | POA: Diagnosis not present

## 2023-05-17 DIAGNOSIS — C9000 Multiple myeloma not having achieved remission: Secondary | ICD-10-CM | POA: Diagnosis not present

## 2023-05-17 DIAGNOSIS — R252 Cramp and spasm: Secondary | ICD-10-CM | POA: Diagnosis not present

## 2023-05-17 DIAGNOSIS — R262 Difficulty in walking, not elsewhere classified: Secondary | ICD-10-CM | POA: Diagnosis not present

## 2023-05-17 NOTE — Therapy (Signed)
 OUTPATIENT PHYSICAL THERAPY NECK AND THORACIC TREATMENT   Patient Name: Travis [name] MRN: [id number] DOB:[birth year], 73 y.o., male Today's Date: 05/17/2023  END OF SESSION:  PT End of Session - 05/17/23 1407     Visit Number 8    Date for PT Re-Evaluation 05/20/23    Authorization Type [employer] MCR    Progress Note Due on Visit 10    PT Start Time 1402    PT Stop Time 1440    PT Time Calculation (min) 38 min    Activity Tolerance Patient limited by fatigue    Behavior During Therapy [facility] for tasks assessed/performed              Past Medical History:  Diagnosis Date   A-fib (HCC)    Arthritis    comes and goes; mostly in hands, occasionally elbow (04/05/2017)   Complication of anesthesia    just wanted to sleep alot  for couple days S/P VARICOCELE OR (04/05/2017)   DVT (deep venous thrombosis) (HCC)    LLE   Dysrhythmia    PVCs    GERD (gastroesophageal reflux disease)    History of hiatal hernia    History of radiation therapy    Thoracic Spine- 07/13/22-07/23/22-Dr. Lynwood Nasuti   Hypertension    Impaired glucose tolerance    Multiple myeloma (HCC)    Obesity (BMI 30-39.9) 07/26/2016   OSA on CPAP 07/26/2016   Mild with AHI 12.5/hr now on CPAP at 14cm H2O   PAF (paroxysmal atrial fibrillation) (HCC)    s/p PVI Ablation in 2018 // Flecainide  Rx // Echo 8/21: EF 60-65, no RWMA, mild LVH, normal RV SF, moderate RVE, mild BAE, trivial MR, mild dilation of aortic root (40 mm)   Pneumonia    may have had walking pneumonia a few years ago (04/05/2017)   Pre-diabetes    Thrombophlebitis    Past Surgical History:  Procedure Laterality Date   ATRIAL FIBRILLATION ABLATION  04/05/2017   ATRIAL FIBRILLATION ABLATION N/A 04/05/2017   Procedure: ATRIAL FIBRILLATION ABLATION;  Surgeon: Kelsie Lynwood, MD;  Location: [facility];  Service: Cardiovascular;  Laterality: N/A;   BONE BIOPSY Right 12/17/2021   Procedure: BONE BIOPSY;  Surgeon: Cristy Bonner DASEN, MD;   Location: [facility];  Service: Orthopedics;  Laterality: Right;   COMPLETE RIGHT HIP REPLACMENT  01/19/2019   EVALUATION UNDER ANESTHESIA WITH HEMORRHOIDECTOMY N/A 02/24/2017   Procedure: EXAM UNDER ANESTHESIA WITH  HEMORRHOIDECTOMY;  Surgeon: Sheldon Standing, MD;  Location: [facility];  Service: General;  Laterality: N/A;   EXCISIONAL TOTAL HIP ARTHROPLASTY WITH ANTIBIOTIC SPACERS Right 09/25/2020   Procedure: EXCISIONAL TOTAL HIP ARTHROPLASTY WITH ANTIBIOTIC SPACERS;  Surgeon: Ernie Cough, MD;  Location: [facility];  Service: Orthopedics;  Laterality: Right;   FLEXIBLE SIGMOIDOSCOPY N/A 04/15/2015   Procedure:  UNSEDATED FLEXIBLE SIGMOIDOSCOPY;  Surgeon: Gladis MARLA Louder, MD;  Location: [facility];  Service: Endoscopy;  Laterality: N/A;   HUMERUS IM NAIL Right 12/17/2021   Procedure: INTRAMEDULLARY (IM) NAIL HUMERAL;  Surgeon: Cristy Bonner DASEN, MD;  Location: [facility];  Service: Orthopedics;  Laterality: Right;   KNEE ARTHROSCOPY Left ~ 2016   POSTERIOR CERVICAL FUSION/FORAMINOTOMY N/A 06/04/2022   Procedure: Thoracic one - Thoracic Four Posterior lateral arthrodesis/ fusion and Thoracic two Corpectomy;  Surgeon: Gillie Duncans, MD;  Location: [facility];  Service: Neurosurgery;  Laterality: N/A;   REIMPLANTATION OF TOTAL HIP Right 12/25/2020   Procedure: REIMPLANTATION/REVISION OF RIGHT TOTAL HIP VERSUS REPEAT IRRIGATION  AND DEBRIDEMENT;  Surgeon: Ernie Cough, MD;  Location: [facility];  Service: Orthopedics;  Laterality: Right;   TESTICLE SURGERY  1988   VARICOCELE   TONSILLECTOMY     VARICOSE VEIN SURGERY Bilateral    Patient Active Problem List   Diagnosis Date Noted   Pancreatic cyst 09/30/2022   Infection due to novel influenza A virus 09/29/2022   Persistent atrial fibrillation (HCC) 07/15/2022   Acute on chronic diastolic heart failure (HCC) 07/03/2022   Pulmonary infiltrates 07/01/2022   Acute respiratory failure with hypoxia (HCC) 07/01/2022   Hyponatremia  06/29/2022   History of thoracic surgery 06/29/2022   Acute DVT of left tibial vein (HCC) 06/21/2022   Hypokalemia 06/21/2022   Vitamin D  deficiency 05/30/2022   Other constipation 05/29/2022   Pathologic compression fracture of spine, initial encounter (HCC) 05/29/2022   Cord compression (HCC) 05/28/2022   Degenerative cervical disc 05/14/2022   Hypercoagulable state due to persistent atrial fibrillation (HCC) 01/11/2022   Multiple myeloma (HCC) 12/10/2021   Medication monitoring encounter 02/27/2021   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   Status post peripherally inserted central catheter (PICC) central line placement 11/06/2020   Drug rash with eosinophilia and systemic symptoms syndrome 11/06/2020   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   PAF (paroxysmal atrial fibrillation) (HCC)    GERD (gastroesophageal reflux disease) 03/29/2018   Hiatal hernia 03/29/2018   History of DVT (deep vein thrombosis) 03/29/2018   CAP (community acquired pneumonia) 03/29/2018   Osteoarthritis 03/29/2018   Neuropathy 08/29/2017   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   Paroxysmal atrial fibrillation with rapid ventricular response (HCC) 04/05/2017   Prolapsed internal hemorrhoids, grade 4, s/p ligation/pexy/hemorrhoidectomy x 2 02/24/2017 02/24/2017   OSA (obstructive sleep apnea) 07/26/2016   Obesity (BMI 30-39.9) 07/26/2016   Atrial fibrillation with RVR (HCC)    Essential hypertension    Chest pain at rest     PCP: Charlott Dorn LABOR, MD   REFERRING PROVIDER: Federico Norleen ONEIDA MADISON, MD   REFERRING DIAG: C90.00 (ICD-10-CM) - Multiple myeloma not having achieved remission (HCC)   THERAPY DIAG:  Muscle weakness (generalized)  Pain in thoracic spine  Cervicalgia  Other symptoms and signs involving the musculoskeletal system  Rationale for Evaluation and Treatment: Rehabilitation  ONSET DATE: Feb 2024   SUBJECTIVE:   SUBJECTIVE STATEMENT: Patient reports  that he took his chemo pill yesterday, so he is not feeling his best today.  PERTINENT HISTORY: significant for IgA Kappa Multiple Myeloma. He was diagnosed with smouldering myeloma in 2018 . July 2023 he developed right arm pain due to a pathologic fx and he underwent radiation and fixation. In early Feb 2024 he had radiation for pathologic T2 compression fx followed by a T1-T4 fusion.  PAIN:  Are you having pain? Yes: NPRS scale: 4/10 Pain location: R arm and upper traps; between shoulder blades, some low back  Pain description: constant tightness.soreness Aggravating factors: not sure Relieving factors: shoulder rolls, aquatic PT. Hot tub  PRECAUTIONS: Other: Multiple Myeloma with mets to R ribs, R humerus with ORIF for pathological fracture 12/17/21. Lesions have also been found in R rib and also in his skull. Pathologic fx T2 with fusion T1-4 on 06/04/22, Bilateral LE edema (on Lasix ), Afib   RED FLAGS: None   WEIGHT BEARING RESTRICTIONS: No  FALLS:  Has patient fallen in last 6 months? Yes. Number of falls 1 walking to fast and tripped over feet  LIVING ENVIRONMENT: Lives with: lives with their  family and lives with their spouse Lives in: House/apartment Stairs: Yes; Internal: 14 steps; on left going up and External: 3 steps; on right going up Has following equipment at home: Single point cane and Walker - 4 wheeled  OCCUPATION: semi-retired, attorney  PLOF: Independent with household mobility with device  PATIENT GOALS: stretch these muscles out  NEXT MD VISIT: 12/4  OBJECTIVE:  Note: Objective measures were completed at Evaluation unless otherwise noted.  DIAGNOSTIC FINDINGS: see chart  PATIENT SURVEYS:  Eval:  NDI 15 / 50 = 30.0 % 05/17/2023:  Neck Disability Index score: 10 / 50 = 20.0 %  COGNITION: Overall cognitive status: Within functional limits for tasks assessed     SENSATION: Some tingling in his hands and feet   POSTURE: rounded shoulders, forward  head, and increased thoracic kyphosis Sits in significant forward head, thoracic kyphosis  PALPATION: Marked tightness in B cervical paraspinals, levator, UT  CERVICAL ROM: * pain  Active ROM A/PROM (deg) eval  Flexion full  Extension 4 deg  Right lateral flexion 17  Left lateral flexion 13*  Right rotation 38  Left rotation 42   (Blank rows = not tested)   SHOULDER AROM (tested in sitting): Flex  R 102 deg, L 123 deg ABD R 88 deg, L 124 deg  Functional IR R thumb to L5, L to T10 Functional ER - can reach behind head easily L, must bend neck forward R  SHOULDER STRENGTH: Flex R 4+/5, ABD R 4/5  else 5/5 B    TODAY'S TREATMENT:                                                                                                                              DATE:   05/17/2023 Nustep level 3 x8 min with PT present to discuss status Neck Disability Index score: 10 / 50 = 20.0 % Seated hamstring stretch 3 x 30 sec bilat Seated clam with red loop x 20 Seated bilateral shoulder ER with green band 2 x 10 Seated bilateral shoulder horizontal abduction with green band 2 x 10 Hip Matrix 25#:  hip abduction and hip extension.  X15 bilat Sit to/from stand holding 5# kettlebell x10 Seated thoracic stretch in chair with ball behind the back x 10 4 square stepping on colored dots on the ground x5 each bilat    05/12/23 Nustep x 8 min level 3 Seated hamstring stretch 3 x 30 sec each LE Sit to stand x 10 lightly with 5 lb kb Seated clam with red loop x 20 Seated mini sit up with 10 lb kb x 20 Seated hamstring curl with green band x 20 each LE Seated bilateral shoulder ER with green band 2 x 10 Seated bilateral shoulder horizontal abduction with green band 2 x 10 Side lunge adductor stretch at steps 2 x 20 sec each Lateral band walks with blue loop x 5 laps at barre Seated thoracic stretch in chair with ball  behind the back x 10 Doorway stretch x 5 holding 10 sec each   05/10/23 Nustep x  8 min level 3 Standing hamstring stretch 3 x 20 sec each LE Standing quad/hip flexor (lunge position on steps) stretch 3 x 20 sec each LE Side lunge adductor stretch at steps 2 x 20 sec each Seated clam with yellow loop x 20 Sit to stand x 10 lightly using hands Lateral band walks with blue loop x 5 laps at barre Seated hamstring curl with green band x 20 each LE Seated mini sit up with 10 lb kb Seated fwd press with 5 lb dumbbells 2 x 10 Seated overhead press with 5 lb dumbbells 2 x 10 Seated row with green band with handles (PT holding other end of band) Seated thoracic stretch in chair with ball behind the back x 10 Seated bilateral shoulder ER with green band 2 x 10 Seated bilateral shoulder horizontal abduction with green band 2 x 10    PATIENT EDUCATION:  Education details: PT eval findings, anticipated POC, initial HEP, and role of DN  Person educated: Patient Education method: Explanation, Demonstration, and Handouts Education comprehension: verbalized understanding and returned demonstration  HOME EXERCISE PROGRAM: Access Code: [email or web address] URL: [email or web address] Date: 03/25/2023 Prepared by: Mliss  Exercises - Seated Cervical Rotation AROM  - 1 x daily - 7 x weekly - 1 sets - 10 reps - 10 hold - Seated Cervical Sidebending AROM  - 1 x daily - 7 x weekly - 1 sets - 10 reps - 5 hold - Seated Cervical Extension AROM  - 1 x daily - 7 x weekly - 1 sets - 10 reps - 10 hold - Seated Scapular Retraction  - 1 x daily - 7 x weekly - 1-3 sets - 10 reps - 2-3 sec hold - Sidelying Thoracic Rotation with Open Book  - 1 x daily - 7 x weekly - 2 sets - 5 reps - Seated Thoracic Extension Arms Overhead  - 1 x daily - 7 x weekly - 1-2 sets - 10 reps - 2-3 sec hold - Seated Trunk Rotation  - 1 x daily - 7 x weekly - 1-2 sets - 10 reps - 2-3 sec hold - Seated Diagonal Chops with Medicine Ball  - 1 x daily - 7 x weekly - 1-2 sets - 10 reps - 2-3  hold  ASSESSMENT:  CLINICAL IMPRESSION: Mr Bellucci presents to skilled PT reporting that he is not feeling well from taking his chemo pill; stating usually the next day is the worst.  Patient able to continue with exercise program.  Patient with improved score noted on Neck Disability Index during session today.  Patient wanted to try the standing hip matrix machine today and he was able to perform with minimal cuing and no reports of increased pain.  Patient to have reassessment visit next visit to assess for continued PT vs discharge with HEP.  OBJECTIVE IMPAIRMENTS: decreased activity tolerance, decreased ROM, decreased strength, increased muscle spasms, impaired flexibility, impaired UE functional use, postural dysfunction, and pain.   ACTIVITY LIMITATIONS: lifting, sleeping, bathing, dressing, and reach over head  PARTICIPATION LIMITATIONS: driving and golf  PERSONAL FACTORS: Fitness, Past/current experiences, and 3+ comorbidities: multiple myeloma, previous fractures  are also affecting patient's functional outcome.   REHAB POTENTIAL: Good  CLINICAL DECISION MAKING: Evolving/moderate complexity  EVALUATION COMPLEXITY: Moderate   GOALS: Goals reviewed with patient? Yes  SHORT TERM GOALS: Target date: 04/22/2023   Patient will be independent with  initial HEP.  Baseline:  Goal status: MET 05/03/23 2.  Improved mobility in thoracic spine by 50% with exercises.  Baseline:  Goal status: MET 05/03/23   LONG TERM GOALS: Target date: 05/20/2023   Patient will be independent with advanced/ongoing HEP to improve outcomes and carryover.  Baseline:  Goal status: INITIAL  2.  Patient will report 50% improvement in neck and upper back pain to improve QOL.  Baseline:  Goal status: INITIAL  3.  Patient will demonstrate improved cervical extension to 10 deg Baseline:  Goal status:INITIAL  4.  Patient will demonstrate improved upper back strength as evidenced by improved sitting and  standing posture. Baseline:  Goal status:INITIAL  5.  Patient will report 7/50 on NDI to demonstrate improved functional ability.  Baseline:  Goal status: Ongoing (see above)   PLAN:  PT FREQUENCY: 2x/week  PT DURATION: 8 weeks  PLANNED INTERVENTIONS: 97164- PT Re-evaluation, 97110-Therapeutic exercises, 97530- Therapeutic activity, [id number]- Neuromuscular re-education, 97535- Self Care, [id number]- Manual therapy, [id number]- Aquatic Therapy, 97014- Electrical stimulation (unattended), Patient/Family education, Taping, Dry Needling, Cryotherapy, and Moist heat  PLAN FOR NEXT SESSION: No mobilizations due to previous fractures, monitor right hip status, work on thoracic and cervical mobility,  postural strength, hip mobility.   Reassessment    Jarrell Laming, PT, DPT 05/17/23, 3:16 PM  [facility] [address] [place], [place] [address] Phone # [phone] Fax [phone]

## 2023-05-18 ENCOUNTER — Inpatient Hospital Stay: Payer: Medicare Other | Admitting: Hematology and Oncology

## 2023-05-18 ENCOUNTER — Inpatient Hospital Stay: Payer: Medicare Other

## 2023-05-18 ENCOUNTER — Ambulatory Visit: Payer: Medicare Other

## 2023-05-18 LAB — MULTIPLE MYELOMA PANEL, SERUM
Albumin SerPl Elph-Mcnc: 3.7 g/dL (ref 2.9–4.4)
Albumin/Glob SerPl: 2.1 — ABNORMAL HIGH (ref 0.7–1.7)
Alpha 1: 0.3 g/dL (ref 0.0–0.4)
Alpha2 Glob SerPl Elph-Mcnc: 0.7 g/dL (ref 0.4–1.0)
B-Globulin SerPl Elph-Mcnc: 0.8 g/dL (ref 0.7–1.3)
Gamma Glob SerPl Elph-Mcnc: 0.2 g/dL — ABNORMAL LOW (ref 0.4–1.8)
Globulin, Total: 1.8 g/dL — ABNORMAL LOW (ref 2.2–3.9)
IgA: 11 mg/dL — ABNORMAL LOW (ref 61–437)
IgG (Immunoglobin G), Serum: 156 mg/dL — ABNORMAL LOW (ref 603–1613)
IgM (Immunoglobulin M), Srm: 8 mg/dL — ABNORMAL LOW (ref 15–143)
Total Protein ELP: 5.5 g/dL — ABNORMAL LOW (ref 6.0–8.5)

## 2023-05-19 ENCOUNTER — Ambulatory Visit: Payer: Medicare Other

## 2023-05-19 DIAGNOSIS — M542 Cervicalgia: Secondary | ICD-10-CM

## 2023-05-19 DIAGNOSIS — M6281 Muscle weakness (generalized): Secondary | ICD-10-CM | POA: Diagnosis not present

## 2023-05-19 DIAGNOSIS — C9000 Multiple myeloma not having achieved remission: Secondary | ICD-10-CM | POA: Diagnosis not present

## 2023-05-19 DIAGNOSIS — M546 Pain in thoracic spine: Secondary | ICD-10-CM

## 2023-05-19 DIAGNOSIS — R293 Abnormal posture: Secondary | ICD-10-CM | POA: Diagnosis not present

## 2023-05-19 DIAGNOSIS — R252 Cramp and spasm: Secondary | ICD-10-CM | POA: Diagnosis not present

## 2023-05-19 DIAGNOSIS — R262 Difficulty in walking, not elsewhere classified: Secondary | ICD-10-CM | POA: Diagnosis not present

## 2023-05-19 DIAGNOSIS — S22000A Wedge compression fracture of unspecified thoracic vertebra, initial encounter for closed fracture: Secondary | ICD-10-CM | POA: Diagnosis not present

## 2023-05-19 DIAGNOSIS — R29898 Other symptoms and signs involving the musculoskeletal system: Secondary | ICD-10-CM | POA: Diagnosis not present

## 2023-05-19 NOTE — Therapy (Signed)
OUTPATIENT PHYSICAL THERAPY NECK AND THORACIC TREATMENT Progress Note Reporting Period 03/25/23 to 05/19/23  See note below for Objective Data and Assessment of Progress/Goals.       Patient Name: Travis [name] MRN: [id number] DOB:1951/03/04, 73 y.o., male Today's Date: 05/19/2023  END OF SESSION:  PT End of Session - 05/19/23 1159     Visit Number 9    Date for PT Re-Evaluation 05/20/23    Progress Note Due on Visit 20    PT Start Time 1148    PT Stop Time 1230    PT Time Calculation (min) 42 min    Activity Tolerance Patient limited by fatigue    Behavior During Therapy [facility] for tasks assessed/performed              Past Medical History:  Diagnosis Date   A-fib [facility])    Arthritis    "comes and goes; mostly in hands, occasionally elbow" (04/05/2017)   Complication of anesthesia    "just wanted to sleep alot  for couple days S/P VARICOCELE OR" (04/05/2017)   DVT (deep venous thrombosis) (HCC)    LLE   Dysrhythmia    PVCs    GERD (gastroesophageal reflux disease)    History of hiatal hernia    History of radiation therapy    Thoracic Spine- 07/13/22-07/23/22-Dr. Antony Blackbird   Hypertension    Impaired glucose tolerance    Multiple myeloma (HCC)    Obesity (BMI 30-39.9) 07/26/2016   OSA on CPAP 07/26/2016   Mild with AHI 12.5/hr now on CPAP at 14cm H2O   PAF (paroxysmal atrial fibrillation) (HCC)    s/p PVI Ablation in 2018 // Flecainide Rx // Echo 8/21: EF 60-65, no RWMA, mild LVH, normal RV SF, moderate RVE, mild BAE, trivial MR, mild dilation of aortic root (40 mm)   Pneumonia    "may have had walking pneumonia a few years ago" (04/05/2017)   Pre-diabetes    Thrombophlebitis    Past Surgical History:  Procedure Laterality Date   ATRIAL FIBRILLATION ABLATION  04/05/2017   ATRIAL FIBRILLATION ABLATION N/A 04/05/2017   Procedure: ATRIAL FIBRILLATION ABLATION;  Surgeon: [name], MD;  [facility];  Service: Cardiovascular;  Laterality:  N/A;   BONE BIOPSY Right 12/17/2021   Procedure: BONE BIOPSY;  Surgeon: [name], MD;  Location: [facility];  Service: Orthopedics;  Laterality: Right;   COMPLETE RIGHT HIP REPLACMENT  01/19/2019   EVALUATION UNDER ANESTHESIA WITH HEMORRHOIDECTOMY N/A 02/24/2017   Procedure: EXAM UNDER ANESTHESIA WITH  HEMORRHOIDECTOMY;  Surgeon: [name], MD;  [facility];  Service: General;  Laterality: N/A;   EXCISIONAL TOTAL HIP ARTHROPLASTY WITH ANTIBIOTIC SPACERS Right 09/25/2020   Procedure: EXCISIONAL TOTAL HIP ARTHROPLASTY WITH ANTIBIOTIC SPACERS;  Surgeon: [name], MD;  [facility]: [facility];  Service: Orthopedics;  Laterality: Right;   FLEXIBLE SIGMOIDOSCOPY N/A 04/15/2015   Procedure:  UNSEDATED FLEXIBLE SIGMOIDOSCOPY;  Surgeon: [name], MD;  [facility];  Service: Endoscopy;  Laterality: N/A;   HUMERUS IM NAIL Right 12/17/2021   Procedure: INTRAMEDULLARY (IM) NAIL HUMERAL;  Surgeon: [name], MD;  Location: [facility];  Service: Orthopedics;  Laterality: Right;   KNEE ARTHROSCOPY Left ~ 2016   POSTERIOR CERVICAL FUSION/FORAMINOTOMY N/A 06/04/2022   Procedure: Thoracic one - Thoracic Four Posterior lateral arthrodesis/ fusion and Thoracic two Corpectomy;  Surgeon: [name], MD;  [facility];  Service: Neurosurgery;  Laterality: N/A;   REIMPLANTATION OF  TOTAL HIP Right 12/25/2020   Procedure: REIMPLANTATION/REVISION OF RIGHT TOTAL HIP VERSUS REPEAT IRRIGATION AND DEBRIDEMENT;  Surgeon: [name], MD;  [facility];  Service: Orthopedics;  Laterality: Right;   TESTICLE SURGERY  1988   VARICOCELE   TONSILLECTOMY     VARICOSE VEIN SURGERY Bilateral    Patient Active Problem List   Diagnosis Date Noted   Pancreatic cyst 09/30/2022   Infection due to novel influenza A virus 09/29/2022   Persistent atrial fibrillation (HCC) 07/15/2022   Acute on chronic diastolic heart failure (HCC) 07/03/2022   Pulmonary infiltrates  07/01/2022   Acute respiratory failure with hypoxia (HCC) 07/01/2022   Hyponatremia 06/29/2022   History of thoracic surgery 06/29/2022   Acute DVT of left tibial vein (HCC) 06/21/2022   Hypokalemia 06/21/2022   Vitamin D deficiency 05/30/2022   Other constipation 05/29/2022   Pathologic compression fracture of spine, initial encounter (HCC) 05/29/2022   Cord compression (HCC) 05/28/2022   Degenerative cervical disc 05/14/2022   Hypercoagulable state due to persistent atrial fibrillation (HCC) 01/11/2022   Multiple myeloma (HCC) 12/10/2021   Medication monitoring encounter 02/27/2021   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   Status post peripherally inserted central catheter (PICC) central line placement 11/06/2020   Drug rash with eosinophilia and systemic symptoms syndrome 11/06/2020   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   PAF (paroxysmal atrial fibrillation) (HCC)    GERD (gastroesophageal reflux disease) 03/29/2018   Hiatal hernia 03/29/2018   History of DVT (deep vein thrombosis) 03/29/2018   CAP (community acquired pneumonia) 03/29/2018   Osteoarthritis 03/29/2018   Neuropathy 08/29/2017   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   Paroxysmal atrial fibrillation with rapid ventricular response (HCC) 04/05/2017   Prolapsed internal hemorrhoids, grade 4, s/p ligation/pexy/hemorrhoidectomy x 2 02/24/2017 02/24/2017   OSA (obstructive sleep apnea) 07/26/2016   Obesity (BMI 30-39.9) 07/26/2016   Atrial fibrillation with RVR (HCC)    Essential hypertension    Chest pain at rest     PCP: [name], MD   REFERRING PROVIDER: Jaci Standard, MD   REFERRING DIAG: C90.00 (ICD-10-CM) - Multiple myeloma not having achieved remission (HCC)   THERAPY DIAG:  Muscle weakness (generalized)  Pain in thoracic spine  Cervicalgia  Cramp and spasm  Difficulty in walking, not elsewhere classified  Abnormal posture  Multiple myeloma not  having achieved remission (HCC)  Compression fracture of thoracic vertebra, unspecified thoracic vertebral level, initial encounter [facility])  Rationale for Evaluation and Treatment: Rehabilitation  ONSET DATE: Feb 2024   SUBJECTIVE:   SUBJECTIVE STATEMENT: Patient reports "bad week".  The chemo and other meds have left me aching and sore and weak.    PERTINENT HISTORY: significant for IgA Kappa Multiple Myeloma. He was diagnosed with smouldering myeloma in 2018 . July 2023 he developed right arm pain due to a pathologic fx and he underwent radiation and fixation. In early Feb 2024 he had radiation for pathologic T2 compression fx followed by a T1-T4 fusion.  PAIN:   05/19/23: Are you having pain? Yes: NPRS scale: 5-6/10 Pain location: R arm and upper traps; between shoulder blades, some low back  Pain description: constant tightness.soreness Aggravating factors: not sure Relieving factors: shoulder rolls, aquatic PT. Hot tub  PRECAUTIONS: Other: Multiple Myeloma with mets to R ribs, R humerus with ORIF for pathological fracture 12/17/21. Lesions have also been found in R rib and also in his skull. Pathologic fx T2 with fusion T1-4 on 06/04/22, Bilateral  LE edema (on Lasix), Afib   RED FLAGS: None   WEIGHT BEARING RESTRICTIONS: No  FALLS:  Has patient fallen in last 6 months? Yes. Number of falls 1 walking to fast and tripped over feet  LIVING ENVIRONMENT: Lives with: lives with their family and lives with their spouse Lives in: House/apartment Stairs: Yes; Internal: 14 steps; on left going up and External: 3 steps; on right going up Has following equipment at home: Single point cane and Walker - 4 wheeled  OCCUPATION: semi-retired, attorney  PLOF: Independent with household mobility with device  PATIENT GOALS: stretch these muscles out  NEXT MD VISIT: 12/4  OBJECTIVE:  Note: Objective measures were completed at Evaluation unless otherwise noted.  DIAGNOSTIC FINDINGS: see  chart  PATIENT SURVEYS:  Eval:  NDI 15 / 50 = 30.0 % 05/17/2023:  Neck Disability Index score: 10 / 50 = 20.0 %  COGNITION: Overall cognitive status: Within functional limits for tasks assessed     SENSATION: Some tingling in his hands and feet   POSTURE: rounded shoulders, forward head, and increased thoracic kyphosis Sits in significant forward head, thoracic kyphosis  PALPATION: Marked tightness in B cervical paraspinals, levator, UT  CERVICAL ROM: * pain  Active ROM A/PROM (deg) eval A/PROM (deg) 05/19/23  Flexion full full  Extension 4 deg 15  Right lateral flexion 17 20  Left lateral flexion 13* 22  Right rotation 38 32  Left rotation 42 40   (Blank rows = not tested)   SHOULDER AROM (tested in sitting): Flex  R 102 deg, L 123 deg ABD R 88 deg, L 124 deg  Functional IR R thumb to L5, L to T10 Functional ER - can reach behind head easily L, must bend neck forward R  05/19/23: Flex  R 116 deg, L 141 deg ABD R 108 deg, L 139 deg  Functional IR R thumb to L5, L to T10 Functional ER -, to C4-5 R,  to C4-5 L  SHOULDER STRENGTH:  Flex R 4+/5, ABD R 4/5  else 5/5 B    TODAY'S TREATMENT:                                                                                                                              DATE:   05/19/2023 Nustep level 3 x 8 min with PT present to discuss status Re cert assessment Hip Matrix 25#:  hip abduction and hip extension.  X15 bilat Seated hamstring stretch 2 x 30 sec bilat Seated clam with red loop x 20 Sit to/from stand holding 5# kettlebell 2 x 10 (second set x 5)  05/17/2023 Nustep level 3 x8 min with PT present to discuss status Neck Disability Index score: 10 / 50 = 20.0 % Seated hamstring stretch 3 x 30 sec bilat Seated clam with red loop x 20 Seated bilateral shoulder ER with green band 2 x 10 Seated bilateral shoulder horizontal abduction with green band 2 x  10 Hip Matrix 25#:  hip abduction and hip extension.  X15  bilat Sit to/from stand holding 5# kettlebell x10 Seated thoracic stretch in chair with ball behind the back x 10 4 square stepping on colored dots on the ground x5 each bilat    05/12/23 Nustep x 8 min level 3 Seated hamstring stretch 3 x 30 sec each LE Sit to stand x 10 lightly with 5 lb kb Seated clam with red loop x 20 Seated mini sit up with 10 lb kb x 20 Seated hamstring curl with green band x 20 each LE Seated bilateral shoulder ER with green band 2 x 10 Seated bilateral shoulder horizontal abduction with green band 2 x 10 Side lunge adductor stretch at steps 2 x 20 sec each Lateral band walks with blue loop x 5 laps at barre Seated thoracic stretch in chair with ball behind the back x 10 Doorway stretch x 5 holding 10 sec each    PATIENT EDUCATION:  Education details: PT eval findings, anticipated POC, initial HEP, and role of DN  Person educated: Patient Education method: Explanation, Demonstration, and Handouts Education comprehension: verbalized understanding and returned demonstration  HOME EXERCISE PROGRAM: Access Code: [email or web address] URL: [email or web address] Date: 03/25/2023 Prepared by: Raynelle Fanning  Exercises - Seated Cervical Rotation AROM  - 1 x daily - 7 x weekly - 1 sets - 10 reps - 10 hold - Seated Cervical Sidebending AROM  - 1 x daily - 7 x weekly - 1 sets - 10 reps - 5 hold - Seated Cervical Extension AROM  - 1 x daily - 7 x weekly - 1 sets - 10 reps - 10 hold - Seated Scapular Retraction  - 1 x daily - 7 x weekly - 1-3 sets - 10 reps - 2-3 sec hold - Sidelying Thoracic Rotation with Open Book  - 1 x daily - 7 x weekly - 2 sets - 5 reps - Seated Thoracic Extension Arms Overhead  - 1 x daily - 7 x weekly - 1-2 sets - 10 reps - 2-3 sec hold - Seated Trunk Rotation  - 1 x daily - 7 x weekly - 1-2 sets - 10 reps - 2-3 sec hold - Seated Diagonal Chops with Medicine Ball  - 1 x daily - 7 x weekly - 1-2 sets - 10 reps - 2-3 hold  ASSESSMENT:  CLINICAL  IMPRESSION: Mr Paolillo has made excellent progress but continues to have functional deficits due to multiple comorbidities.  He will be undergoing a T- cell procedure beginning Feb 14.  He is now using a cane.  He has gained ROM in the cervical spine and both shoulders.  He is well motivated and compliant.  He should continue to do well.  He would benefit from continuing skilled PT for generalized strengthening and flexibility to restore patient to PLOF.    OBJECTIVE IMPAIRMENTS: decreased activity tolerance, decreased ROM, decreased strength, increased muscle spasms, impaired flexibility, impaired UE functional use, postural dysfunction, and pain.   ACTIVITY LIMITATIONS: lifting, sleeping, bathing, dressing, and reach over head  PARTICIPATION LIMITATIONS: driving and golf  PERSONAL FACTORS: Fitness, Past/current experiences, and 3+ comorbidities: multiple myeloma, previous fractures  are also affecting patient's functional outcome.   REHAB POTENTIAL: Good  CLINICAL DECISION MAKING: Evolving/moderate complexity  EVALUATION COMPLEXITY: Moderate   GOALS: Goals reviewed with patient? Yes  SHORT TERM GOALS: Target date: 04/22/2023   Patient will be independent with initial HEP.  Baseline:  Goal status: MET 05/03/23 2.  Improved  mobility in thoracic spine by 50% with exercises.  Baseline:  Goal status: MET 05/03/23   LONG TERM GOALS: Target date: 05/20/2023   Patient will be independent with advanced/ongoing HEP to improve outcomes and carryover.  Baseline:  Goal status: In progress  2.  Patient will report 50% improvement in neck and upper back pain to improve QOL.  Baseline:  Goal status: MET 05/19/23  3.  Patient will demonstrate improved cervical extension to 10 deg Baseline:  Goal status:MET 05/19/23  4.  Patient will demonstrate improved upper back strength as evidenced by improved sitting and standing posture. Baseline:  Goal status:In Progress  5.  Patient will report  7/50 on NDI to demonstrate improved functional ability.  Baseline:  Goal status: Ongoing (see above)   PLAN:  PT FREQUENCY: 2x/week  PT DURATION: 8 weeks  PLANNED INTERVENTIONS: 97164- PT Re-evaluation, 97110-Therapeutic exercises, 97530- Therapeutic activity, [id number]- Neuromuscular re-education, 97535- Self Care, 30865- Manual therapy, [id number]- Aquatic Therapy, 97014- Electrical stimulation (unattended), Patient/Family education, Taping, Dry Needling, Cryotherapy, and Moist heat  PLAN FOR NEXT SESSION: Continue 1-2 times per week x 4 weeks.  No mobilizations due to previous fractures, monitor right hip status, work on thoracic and cervical mobility,  postural strength, hip mobility.     Victorino Dike B. Gracynn Rajewski, PT 05/20/23 7:01 AM [facility] [address], [address] [place], [place] [address] Phone # [phone] Fax [phone]

## 2023-05-23 ENCOUNTER — Other Ambulatory Visit: Payer: Self-pay | Admitting: Hematology and Oncology

## 2023-05-23 ENCOUNTER — Other Ambulatory Visit: Payer: Self-pay | Admitting: *Deleted

## 2023-05-23 DIAGNOSIS — C9000 Multiple myeloma not having achieved remission: Secondary | ICD-10-CM

## 2023-05-23 MED ORDER — POMALIDOMIDE 1 MG PO CAPS: 1.0000 mg | ORAL_CAPSULE | Freq: Every day | ORAL | 0 refills | Status: DC

## 2023-05-23 NOTE — Progress Notes (Unsigned)
a   Cardiology Office Note   Date:  05/24/2023   ID:  Travis [name], Travis [name] July 08, 1950, MRN [id number]  PCP:  [name], MD  Cardiologist:   [name], MD   Pt presents for follow up of PAF and HFpEF   History of Present Illness: Travis [name] is a 73 y.o. male with a history of  HTN, PAF(CHADSVASc score 4; s/p Afib ablation 2018 )  HFpEF, OSA, DVT, multiple myeloma and peripheral neuropathy  In 2018 Cardiac CTA showed no CAD  Calcium score was 0   Fall 2023 he was hospitalized in [facility] for afib / flutter  Volume overloaded at time    Diuresed   Converted to SR  B blocker stopped due to bradycardia then resumed         Dec 2023   Back in afib   Flecanide stopped   Amiodarone started    Jan 2024  Seen by [name].   Amiodarone cut back to 200 mg daily   March 2024  [facility] for pneumonia   Afib RVR   Rx IV amiodarone      I saw the pt in Sept 2024   He was seen by [name] in the interval  He continues to follow in Oncology for his multiple myeloma   Complaining of rib pain Plan is to go to [facility] in early Feb for CART therapy, cells have been harvested  The pt continues on torsemide zaroxyln (every other day), KCL   He is on decadron  Says he is thirsty all the time Abd is more distended   He does admit to eating more as well Currently significant LE edema  He denies CP   No palpitations    No dizziness   Sleeping well at night  Allergies:   Privigen [immune globulin (human)], Linezolid, and Vancomycin   Past Medical History:  Diagnosis Date   A-fib [facility])    Arthritis    "comes and goes; mostly in hands, occasionally elbow" (04/05/2017)   Complication of anesthesia    "just wanted to sleep alot  for couple days S/P VARICOCELE OR" (04/05/2017)   DVT (deep venous thrombosis) (HCC)    LLE   Dysrhythmia    PVCs    GERD (gastroesophageal reflux disease)    History of hiatal hernia    History of radiation therapy    Thoracic Spine- 07/13/22-07/23/22-Dr. Antony Blackbird    Hypertension    Impaired glucose tolerance    Multiple myeloma (HCC)    Obesity (BMI 30-39.9) 07/26/2016   OSA on CPAP 07/26/2016   Mild with AHI 12.5/hr now on CPAP at 14cm H2O   PAF (paroxysmal atrial fibrillation) (HCC)    s/p PVI Ablation in 2018 // Flecainide Rx // Echo 8/21: EF 60-65, no RWMA, mild LVH, normal RV SF, moderate RVE, mild BAE, trivial MR, mild dilation of aortic root (40 mm)   Pneumonia    "may have had walking pneumonia a few years ago" (04/05/2017)   Pre-diabetes    Thrombophlebitis     Past Surgical History:  Procedure Laterality Date   ATRIAL FIBRILLATION ABLATION  04/05/2017   ATRIAL FIBRILLATION ABLATION N/A 04/05/2017   Procedure: ATRIAL FIBRILLATION ABLATION;  Surgeon: [name], MD;  [facility];  Service: Cardiovascular;  Laterality: N/A;   BONE BIOPSY Right 12/17/2021   Procedure: BONE BIOPSY;  Surgeon: [name], MD;  Location: [facility];  Service: Orthopedics;  Laterality:  Right;   COMPLETE RIGHT HIP REPLACMENT  01/19/2019   EVALUATION UNDER ANESTHESIA WITH HEMORRHOIDECTOMY N/A 02/24/2017   Procedure: EXAM UNDER ANESTHESIA WITH  HEMORRHOIDECTOMY;  Surgeon: [name], MD;  [facility];  Service: General;  Laterality: N/A;   EXCISIONAL TOTAL HIP ARTHROPLASTY WITH ANTIBIOTIC SPACERS Right 09/25/2020   Procedure: EXCISIONAL TOTAL HIP ARTHROPLASTY WITH ANTIBIOTIC SPACERS;  Surgeon: [name], MD;  Location: [facility];  Service: Orthopedics;  Laterality: Right;   FLEXIBLE SIGMOIDOSCOPY N/A 04/15/2015   Procedure:  UNSEDATED FLEXIBLE SIGMOIDOSCOPY;  Surgeon: [name], MD;  [facility];  Service: Endoscopy;  Laterality: N/A;   HUMERUS IM NAIL Right 12/17/2021   Procedure: INTRAMEDULLARY (IM) NAIL HUMERAL;  Surgeon: [name], MD;  Location: [facility];  Service: Orthopedics;  Laterality: Right;   KNEE ARTHROSCOPY Left ~ 2016   POSTERIOR CERVICAL FUSION/FORAMINOTOMY N/A 06/04/2022    Procedure: Thoracic one - Thoracic Four Posterior lateral arthrodesis/ fusion and Thoracic two Corpectomy;  Surgeon: [name], MD;  [facility];  Service: Neurosurgery;  Laterality: N/A;   REIMPLANTATION OF TOTAL HIP Right 12/25/2020   Procedure: REIMPLANTATION/REVISION OF RIGHT TOTAL HIP VERSUS REPEAT IRRIGATION AND DEBRIDEMENT;  Surgeon: [name], MD;  [facility];  Service: Orthopedics;  Laterality: Right;   TESTICLE SURGERY  1988   VARICOCELE   TONSILLECTOMY     VARICOSE VEIN SURGERY Bilateral      Social History:  The patient  reports that he has never smoked. He has never used smokeless tobacco. He reports current alcohol use of about 2.0 standard drinks of alcohol per week. He reports that he does not use drugs.   Family History:  The patient's family history includes Atrial fibrillation in his father; Dementia in his maternal grandmother and mother; Hypertension in his father and paternal grandfather; Stroke in his father.    ROS:  Please see the history of present illness. All other systems are reviewed and  Negative to the above problem except as noted.    PHYSICAL EXAM: VS:  BP 136/68   Pulse 61   Resp 18   Ht 5\' 10"  (1.778 m)   Wt 270 lb 6.4 oz (122.7 kg)   SpO2 97%   BMI 38.80 kg/m   Pt examined in chair GEN: Obese 73 yo in no acute distress  Neck:  JVP is not elevated  Cardiac: RRR; no murmur  Tr LE edema    Respiratory:  clear to auscultation  GI: Supple   Nontender  No hepatomegaly   EKG:  EKG is not ordered today   Monitor  Aug 2024   Rhythms:   Sinus rhythm and atrial fibrillation (47% burden  Longest episode for 1 day, 10 hours 23 min on 11/15/22)  Heart rates 42 to 158 bpms   Average HR 76 bpm.  Rare PVCs Couple triplets       Triggered events corresponded to Sinus rhythm and atrial fibrillation  None were at extreme heart rates     Echo  May 2024   1. Left ventricular ejection fraction, by estimation, is 60 to 65%. The  left  ventricle has normal function. The left ventricle has no regional  wall motion abnormalities. There is severe concentric left ventricular  hypertrophy. Left ventricular diastolic   parameters are indeterminate.   2. Right ventricular systolic function is normal. The right ventricular  size is mildly enlarged. There is normal pulmonary artery systolic  pressure.   3. Left atrial size  was severely dilated.   4. Right atrial size was mildly dilated.   5. The mitral valve is normal in structure. No evidence of mitral valve  regurgitation. No evidence of mitral stenosis.   6. The aortic valve is normal in structure. Aortic valve regurgitation is  not visualized. No aortic stenosis is present.   7. There is mild dilatation of the ascending aorta, measuring 37 mm.   8. The inferior vena cava is dilated in size with <50% respiratory  variability, suggesting right atrial pressure of 15 mmHg.    Lipid Panel No results found for: "CHOL", "TRIG", "HDL", "CHOLHDL", "VLDL", "LDLCALC", "LDLDIRECT"    Wt Readings from Last 3 Encounters:  05/24/23 270 lb 6.4 oz (122.7 kg)  05/11/23 276 lb 3.2 oz (125.3 kg)  04/07/23 265 lb (120.2 kg)      ASSESSMENT AND PLAN:   1  HFpEF  Pt's volume status is better than it has been   Breathing OK  No LE edema   THirst driven in part to steroids and diuretic use  I would continue on current regimen   His Cr and K are OK      2  PAF  Clinically in SR   Keep on amioarone 200 and Eliquis   3  HTN  BP is Fair   He is only taking Entresto daily; increase to 2x per day   Follow for now   4  Hx of Bradycardia  HR 60 today     5.  Lipids. Last LDL 93  HDL 63  Trig 207   watch carbs     6.  OSA.   Continue using CPAP   Current medicines are reviewed at length with the patient today.  The patient does not have concerns regarding medicines.  Signed, [name], MD  05/24/2023 3:44 PM    [facility] [address] [place], [place], [place]   [address] Phone: [phone]; Fax: [phone]

## 2023-05-24 ENCOUNTER — Ambulatory Visit: Payer: Medicare Other | Attending: Internal Medicine | Admitting: Internal Medicine

## 2023-05-24 VITALS — BP 136/68 | HR 61 | Resp 18 | Ht 70.0 in | Wt 270.4 lb

## 2023-05-24 DIAGNOSIS — I480 Paroxysmal atrial fibrillation: Secondary | ICD-10-CM | POA: Diagnosis not present

## 2023-05-24 NOTE — Patient Instructions (Addendum)
Medication Instructions:  Your physician recommends that you continue on your current medications as directed. Please refer to the Current Medication list given to you today.  *If you need a refill on your cardiac medications before your next appointment, please call your pharmacy*  Lab Work: None ordered today. If you have labs (blood work) drawn today and your tests are completely normal, you will receive your results only by: MyChart Message (if you have MyChart) OR A paper copy in the mail If you have any lab test that is abnormal or we need to change your treatment, we will call you to review the results.  Testing/Procedures: None ordered today.  Follow-Up: At [facility], you and your health needs are our priority.  As part of our continuing mission to provide you with exceptional heart care, we have created designated Provider Care Teams.  These Care Teams include your primary Cardiologist (physician) and Advanced Practice Providers (APPs -  Physician Assistants and Nurse Practitioners) who all work together to provide you with the care you need, when you need it.  We recommend signing up for the patient portal called "MyChart".  Sign up information is provided on this After Visit Summary.  MyChart is used to connect with patients for Virtual Visits (Telemedicine).  Patients are able to view lab/test results, encounter notes, upcoming appointments, etc.  Non-urgent messages can be sent to your provider as well.   To learn more about what you can do with MyChart, go to [email or web address].    Your next appointment:   6 month(s)  The format for your next appointment:   In Person  Provider:   [name], MD {

## 2023-05-25 ENCOUNTER — Telehealth: Payer: Self-pay | Admitting: *Deleted

## 2023-05-25 ENCOUNTER — Other Ambulatory Visit: Payer: Self-pay | Admitting: Hematology and Oncology

## 2023-05-25 ENCOUNTER — Inpatient Hospital Stay (HOSPITAL_BASED_OUTPATIENT_CLINIC_OR_DEPARTMENT_OTHER): Payer: Medicare Other | Admitting: Hematology and Oncology

## 2023-05-25 ENCOUNTER — Encounter: Payer: Self-pay | Admitting: Hematology and Oncology

## 2023-05-25 ENCOUNTER — Inpatient Hospital Stay: Payer: Medicare Other

## 2023-05-25 ENCOUNTER — Ambulatory Visit (HOSPITAL_COMMUNITY)
Admission: RE | Admit: 2023-05-25 | Discharge: 2023-05-25 | Disposition: A | Discharge status: Home / Self Care | Payer: Medicare Other | Source: Ambulatory Visit | Attending: Hematology and Oncology | Admitting: Hematology and Oncology

## 2023-05-25 ENCOUNTER — Ambulatory Visit: Payer: Medicare Other

## 2023-05-25 VITALS — BP 104/45 | HR 50 | Temp 98.1°F | Resp 16 | Wt 271.3 lb

## 2023-05-25 DIAGNOSIS — Z7901 Long term (current) use of anticoagulants: Secondary | ICD-10-CM | POA: Diagnosis not present

## 2023-05-25 DIAGNOSIS — M542 Cervicalgia: Secondary | ICD-10-CM

## 2023-05-25 DIAGNOSIS — R252 Cramp and spasm: Secondary | ICD-10-CM | POA: Diagnosis not present

## 2023-05-25 DIAGNOSIS — I10 Essential (primary) hypertension: Secondary | ICD-10-CM | POA: Diagnosis not present

## 2023-05-25 DIAGNOSIS — Z8249 Family history of ischemic heart disease and other diseases of the circulatory system: Secondary | ICD-10-CM | POA: Diagnosis not present

## 2023-05-25 DIAGNOSIS — R600 Localized edema: Secondary | ICD-10-CM | POA: Diagnosis not present

## 2023-05-25 DIAGNOSIS — R1310 Dysphagia, unspecified: Secondary | ICD-10-CM | POA: Diagnosis not present

## 2023-05-25 DIAGNOSIS — M533 Sacrococcygeal disorders, not elsewhere classified: Secondary | ICD-10-CM | POA: Diagnosis not present

## 2023-05-25 DIAGNOSIS — C9000 Multiple myeloma not having achieved remission: Secondary | ICD-10-CM | POA: Insufficient documentation

## 2023-05-25 DIAGNOSIS — R262 Difficulty in walking, not elsewhere classified: Secondary | ICD-10-CM | POA: Diagnosis not present

## 2023-05-25 DIAGNOSIS — R293 Abnormal posture: Secondary | ICD-10-CM | POA: Diagnosis not present

## 2023-05-25 DIAGNOSIS — Z9089 Acquired absence of other organs: Secondary | ICD-10-CM | POA: Diagnosis not present

## 2023-05-25 DIAGNOSIS — M546 Pain in thoracic spine: Secondary | ICD-10-CM

## 2023-05-25 DIAGNOSIS — M48061 Spinal stenosis, lumbar region without neurogenic claudication: Secondary | ICD-10-CM | POA: Diagnosis not present

## 2023-05-25 DIAGNOSIS — M6281 Muscle weakness (generalized): Secondary | ICD-10-CM

## 2023-05-25 DIAGNOSIS — I480 Paroxysmal atrial fibrillation: Secondary | ICD-10-CM | POA: Diagnosis not present

## 2023-05-25 DIAGNOSIS — E876 Hypokalemia: Secondary | ICD-10-CM | POA: Diagnosis not present

## 2023-05-25 DIAGNOSIS — Z881 Allergy status to other antibiotic agents status: Secondary | ICD-10-CM | POA: Diagnosis not present

## 2023-05-25 DIAGNOSIS — I517 Cardiomegaly: Secondary | ICD-10-CM | POA: Diagnosis not present

## 2023-05-25 DIAGNOSIS — R21 Rash and other nonspecific skin eruption: Secondary | ICD-10-CM | POA: Diagnosis not present

## 2023-05-25 DIAGNOSIS — R0781 Pleurodynia: Secondary | ICD-10-CM | POA: Diagnosis not present

## 2023-05-25 DIAGNOSIS — Z79899 Other long term (current) drug therapy: Secondary | ICD-10-CM | POA: Diagnosis not present

## 2023-05-25 DIAGNOSIS — Z823 Family history of stroke: Secondary | ICD-10-CM | POA: Diagnosis not present

## 2023-05-25 DIAGNOSIS — R29898 Other symptoms and signs involving the musculoskeletal system: Secondary | ICD-10-CM | POA: Diagnosis not present

## 2023-05-25 DIAGNOSIS — M5126 Other intervertebral disc displacement, lumbar region: Secondary | ICD-10-CM | POA: Diagnosis not present

## 2023-05-25 DIAGNOSIS — Z79624 Long term (current) use of inhibitors of nucleotide synthesis: Secondary | ICD-10-CM | POA: Diagnosis not present

## 2023-05-25 DIAGNOSIS — Z86718 Personal history of other venous thrombosis and embolism: Secondary | ICD-10-CM | POA: Diagnosis not present

## 2023-05-25 DIAGNOSIS — K219 Gastro-esophageal reflux disease without esophagitis: Secondary | ICD-10-CM | POA: Diagnosis not present

## 2023-05-25 DIAGNOSIS — S22000A Wedge compression fracture of unspecified thoracic vertebra, initial encounter for closed fracture: Secondary | ICD-10-CM | POA: Diagnosis not present

## 2023-05-25 DIAGNOSIS — G4733 Obstructive sleep apnea (adult) (pediatric): Secondary | ICD-10-CM | POA: Diagnosis not present

## 2023-05-25 LAB — CBC WITH DIFFERENTIAL (CANCER CENTER ONLY)
Abs Immature Granulocytes: 0.03 10*3/uL (ref 0.00–0.07)
Basophils Absolute: 0 10*3/uL (ref 0.0–0.1)
Basophils Relative: 0 %
Eosinophils Absolute: 0.1 10*3/uL (ref 0.0–0.5)
Eosinophils Relative: 1 %
HCT: 35.2 % — ABNORMAL LOW (ref 39.0–52.0)
Hemoglobin: 11.4 g/dL — ABNORMAL LOW (ref 13.0–17.0)
Immature Granulocytes: 0 %
Lymphocytes Relative: 9 %
Lymphs Abs: 0.7 10*3/uL (ref 0.7–4.0)
MCH: 33.2 pg (ref 26.0–34.0)
MCHC: 32.4 g/dL (ref 30.0–36.0)
MCV: 102.6 fL — ABNORMAL HIGH (ref 80.0–100.0)
Monocytes Absolute: 1.3 10*3/uL — ABNORMAL HIGH (ref 0.1–1.0)
Monocytes Relative: 17 %
Neutro Abs: 5.4 10*3/uL (ref 1.7–7.7)
Neutrophils Relative %: 73 %
Platelet Count: 148 10*3/uL — ABNORMAL LOW (ref 150–400)
RBC: 3.43 MIL/uL — ABNORMAL LOW (ref 4.22–5.81)
RDW: 13.6 % (ref 11.5–15.5)
WBC Count: 7.4 10*3/uL (ref 4.0–10.5)
nRBC: 0.4 % — ABNORMAL HIGH (ref 0.0–0.2)

## 2023-05-25 LAB — CMP (CANCER CENTER ONLY)
ALT: 17 U/L (ref 0–44)
AST: 14 U/L — ABNORMAL LOW (ref 15–41)
Albumin: 4 g/dL (ref 3.5–5.0)
Alkaline Phosphatase: 71 U/L (ref 38–126)
Anion gap: 7 (ref 5–15)
BUN: 41 mg/dL — ABNORMAL HIGH (ref 8–23)
CO2: 27 mmol/L (ref 22–32)
Calcium: 9.3 mg/dL (ref 8.9–10.3)
Chloride: 103 mmol/L (ref 98–111)
Creatinine: 1.18 mg/dL (ref 0.61–1.24)
GFR, Estimated: >60 mL/min (ref >60)
Glucose, Bld: 110 mg/dL — ABNORMAL HIGH (ref 70–99)
Potassium: 3.8 mmol/L (ref 3.5–5.1)
Sodium: 137 mmol/L (ref 135–145)
Total Bilirubin: 0.5 mg/dL (ref 0.0–1.2)
Total Protein: 6.1 g/dL — ABNORMAL LOW (ref 6.5–8.1)

## 2023-05-25 LAB — LACTATE DEHYDROGENASE: LDH: 152 U/L (ref 98–192)

## 2023-05-25 NOTE — Therapy (Signed)
OUTPATIENT PHYSICAL THERAPY NECK AND THORACIC TREATMENT  Patient Name: Travis [name] MRN: [id number] DOB:[birth year], 73 y.o., male Today's Date: 05/25/2023  END OF SESSION:  PT End of Session - 05/25/23 1450     Visit Number 10    Date for PT Re-Evaluation 06/17/23    Authorization Type [employer] MCR    Progress Note Due on Visit 20    PT Start Time 1447    PT Stop Time 1530    PT Time Calculation (min) 43 min    Activity Tolerance Patient limited by fatigue    Behavior During Therapy [facility] for tasks assessed/performed              Past Medical History:  Diagnosis Date   A-fib (HCC)    Arthritis    "comes and goes; mostly in hands, occasionally elbow" (04/05/2017)   Complication of anesthesia    "just wanted to sleep alot  for couple days S/P VARICOCELE OR" (04/05/2017)   DVT (deep venous thrombosis) (HCC)    LLE   Dysrhythmia    PVCs    GERD (gastroesophageal reflux disease)    History of hiatal hernia    History of radiation therapy    Thoracic Spine- 07/13/22-07/23/22-Dr. Antony Blackbird   Hypertension    Impaired glucose tolerance    Multiple myeloma (HCC)    Obesity (BMI 30-39.9) 07/26/2016   OSA on CPAP 07/26/2016   Mild with AHI 12.5/hr now on CPAP at 14cm H2O   PAF (paroxysmal atrial fibrillation) (HCC)    s/p PVI Ablation in 2018 // Flecainide Rx // Echo 8/21: EF 60-65, no RWMA, mild LVH, normal RV SF, moderate RVE, mild BAE, trivial MR, mild dilation of aortic root (40 mm)   Pneumonia    "may have had walking pneumonia a few years ago" (04/05/2017)   Pre-diabetes    Thrombophlebitis    Past Surgical History:  Procedure Laterality Date   ATRIAL FIBRILLATION ABLATION  04/05/2017   ATRIAL FIBRILLATION ABLATION N/A 04/05/2017   Procedure: ATRIAL FIBRILLATION ABLATION;  Surgeon: [name], MD;  Location: [facility];  Service: Cardiovascular;  Laterality: N/A;   BONE BIOPSY Right 12/17/2021   Procedure: BONE BIOPSY;  Surgeon: [name], MD;   Location: [facility];  Service: Orthopedics;  Laterality: Right;   COMPLETE RIGHT HIP REPLACMENT  01/19/2019   EVALUATION UNDER ANESTHESIA WITH HEMORRHOIDECTOMY N/A 02/24/2017   Procedure: EXAM UNDER ANESTHESIA WITH  HEMORRHOIDECTOMY;  Surgeon: [name], MD;  Location: [facility];  Service: General;  Laterality: N/A;   EXCISIONAL TOTAL HIP ARTHROPLASTY WITH ANTIBIOTIC SPACERS Right 09/25/2020   Procedure: EXCISIONAL TOTAL HIP ARTHROPLASTY WITH ANTIBIOTIC SPACERS;  Surgeon: [name], MD;  Location: [facility];  Service: Orthopedics;  Laterality: Right;   FLEXIBLE SIGMOIDOSCOPY N/A 04/15/2015   Procedure:  UNSEDATED FLEXIBLE SIGMOIDOSCOPY;  Surgeon: [name], MD;  Location: [facility];  Service: Endoscopy;  Laterality: N/A;   HUMERUS IM NAIL Right 12/17/2021   Procedure: INTRAMEDULLARY (IM) NAIL HUMERAL;  Surgeon: [name], MD;  Location: [facility];  Service: Orthopedics;  Laterality: Right;   KNEE ARTHROSCOPY Left ~ 2016   POSTERIOR CERVICAL FUSION/FORAMINOTOMY N/A 06/04/2022   Procedure: Thoracic one - Thoracic Four Posterior lateral arthrodesis/ fusion and Thoracic two Corpectomy;  Surgeon: [name], MD;  Location: [facility];  Service: Neurosurgery;  Laterality: N/A;   REIMPLANTATION OF TOTAL HIP Right 12/25/2020   Procedure: REIMPLANTATION/REVISION OF RIGHT TOTAL HIP VERSUS REPEAT IRRIGATION AND  DEBRIDEMENT;  Surgeon: [name], MD;  Location: [facility];  Service: Orthopedics;  Laterality: Right;   TESTICLE SURGERY  1988   VARICOCELE   TONSILLECTOMY     VARICOSE VEIN SURGERY Bilateral    Patient Active Problem List   Diagnosis Date Noted   Pancreatic cyst 09/30/2022   Infection due to novel influenza A virus 09/29/2022   Persistent atrial fibrillation (HCC) 07/15/2022   Acute on chronic diastolic heart failure (HCC) 07/03/2022   Pulmonary infiltrates 07/01/2022   Acute respiratory failure with hypoxia (HCC) 07/01/2022   Hyponatremia  06/29/2022   History of thoracic surgery 06/29/2022   Acute DVT of left tibial vein (HCC) 06/21/2022   Hypokalemia 06/21/2022   Vitamin D deficiency 05/30/2022   Other constipation 05/29/2022   Pathologic compression fracture of spine, initial encounter (HCC) 05/29/2022   Cord compression (HCC) 05/28/2022   Degenerative cervical disc 05/14/2022   Hypercoagulable state due to persistent atrial fibrillation (HCC) 01/11/2022   Multiple myeloma (HCC) 12/10/2021   Medication monitoring encounter 02/27/2021   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   Status post peripherally inserted central catheter (PICC) central line placement 11/06/2020   Drug rash with eosinophilia and systemic symptoms syndrome 11/06/2020   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   PAF (paroxysmal atrial fibrillation) (HCC)    GERD (gastroesophageal reflux disease) 03/29/2018   Hiatal hernia 03/29/2018   History of DVT (deep vein thrombosis) 03/29/2018   CAP (community acquired pneumonia) 03/29/2018   Osteoarthritis 03/29/2018   Neuropathy 08/29/2017   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   Paroxysmal atrial fibrillation with rapid ventricular response (HCC) 04/05/2017   Prolapsed internal hemorrhoids, grade 4, s/p ligation/pexy/hemorrhoidectomy x 2 02/24/2017 02/24/2017   OSA (obstructive sleep apnea) 07/26/2016   Obesity (BMI 30-39.9) 07/26/2016   Atrial fibrillation with RVR (HCC)    Essential hypertension    Chest pain at rest     PCP: [name], MD   REFERRING PROVIDER: Jaci Standard, MD   REFERRING DIAG: C90.00 (ICD-10-CM) - Multiple myeloma not having achieved remission (HCC)   THERAPY DIAG:  Muscle weakness (generalized)  Pain in thoracic spine  Abnormal posture  Cramp and spasm  Cervicalgia  Difficulty in walking, not elsewhere classified  Multiple myeloma not having achieved remission [facility])  Rationale for Evaluation and Treatment:  Rehabilitation  ONSET DATE: Feb 2024   SUBJECTIVE:   SUBJECTIVE STATEMENT: Patient reports still feeling weak.  Took my strong chemo pill on Monday and my BP has been really low.      PERTINENT HISTORY: significant for IgA Kappa Multiple Myeloma. He was diagnosed with smouldering myeloma in 2018 . July 2023 he developed right arm pain due to a pathologic fx and he underwent radiation and fixation. In early Feb 2024 he had radiation for pathologic T2 compression fx followed by a T1-T4 fusion.  PAIN:   05/19/23: Are you having pain? Yes: NPRS scale: 5-6/10 Pain location: R arm and upper traps; between shoulder blades, some low back  Pain description: constant tightness.soreness Aggravating factors: not sure Relieving factors: shoulder rolls, aquatic PT. Hot tub  PRECAUTIONS: Other: Multiple Myeloma with mets to R ribs, R humerus with ORIF for pathological fracture 12/17/21. Lesions have also been found in R rib and also in his skull. Pathologic fx T2 with fusion T1-4 on 06/04/22, Bilateral LE edema (on Lasix), Afib   RED FLAGS: None   WEIGHT BEARING RESTRICTIONS: No  FALLS:  Has patient fallen in last 6  months? Yes. Number of falls 1 walking to fast and tripped over feet  LIVING ENVIRONMENT: Lives with: lives with their family and lives with their spouse Lives in: House/apartment Stairs: Yes; Internal: 14 steps; on left going up and External: 3 steps; on right going up Has following equipment at home: Single point cane and Walker - 4 wheeled  OCCUPATION: semi-retired, attorney  PLOF: Independent with household mobility with device  PATIENT GOALS: stretch these muscles out  NEXT MD VISIT: 12/4  OBJECTIVE:  Note: Objective measures were completed at Evaluation unless otherwise noted.  DIAGNOSTIC FINDINGS: see chart  PATIENT SURVEYS:  Eval:  NDI 15 / 50 = 30.0 % 05/17/2023:  Neck Disability Index score: 10 / 50 = 20.0 %  COGNITION: Overall cognitive status: Within  functional limits for tasks assessed     SENSATION: Some tingling in his hands and feet   POSTURE: rounded shoulders, forward head, and increased thoracic kyphosis Sits in significant forward head, thoracic kyphosis  PALPATION: Marked tightness in B cervical paraspinals, levator, UT  CERVICAL ROM: * pain  Active ROM A/PROM (deg) eval A/PROM (deg) 05/19/23  Flexion full full  Extension 4 deg 15  Right lateral flexion 17 20  Left lateral flexion 13* 22  Right rotation 38 32  Left rotation 42 40   (Blank rows = not tested)   SHOULDER AROM (tested in sitting): Flex  R 102 deg, L 123 deg ABD R 88 deg, L 124 deg  Functional IR R thumb to L5, L to T10 Functional ER - can reach behind head easily L, must bend neck forward R  05/19/23: Flex  R 116 deg, L 141 deg ABD R 108 deg, L 139 deg  Functional IR R thumb to L5, L to T10 Functional ER -, to C4-5 R,  to C4-5 L  SHOULDER STRENGTH:  Flex R 4+/5, ABD R 4/5  else 5/5 B    TODAY'S TREATMENT:                                                                                                                              DATE:   05/25/2023 Nustep level 3 x 8 min with PT present to discuss status Hip Matrix 25#:  hip abduction and hip extension. 2 x 10 bilat Leg press 70 lbs 2 x 20 (needs assist to get feet in place on foot plate) Standing matrix lat pull down 2 x 10 with 40 lbs Standing matrix tricep press 20 lbs 2 x 10 Standing matrix row 15 lbs 2 x 10 Seated clam with red loop x 20 Seated hamstring curls with red tband x 20 Seated bilateral shoulder ER with red tband x 20 Seated bilateral shoulder horizontal abduction 2 x 10 Seated bilateral bicep curls 2 x 10 with red tband Sit to/from stand  2 x 10 (second set did 2 sets of 5)  05/19/2023 Nustep level 3 x 8 min with PT present to  discuss status Re cert assessment Hip Matrix 25#:  hip abduction and hip extension.  X15 bilat Seated hamstring stretch 2 x 30 sec bilat Seated  clam with red loop x 20 Sit to/from stand holding 5# kettlebell 2 x 10 (second set x 5)  05/17/2023 Nustep level 3 x8 min with PT present to discuss status Neck Disability Index score: 10 / 50 = 20.0 % Seated hamstring stretch 3 x 30 sec bilat Seated clam with red loop x 20 Seated bilateral shoulder ER with green band 2 x 10 Seated bilateral shoulder horizontal abduction with green band 2 x 10 Hip Matrix 25#:  hip abduction and hip extension.  X15 bilat Sit to/from stand holding 5# kettlebell x10 Seated thoracic stretch in chair with ball behind the back x 10 4 square stepping on colored dots on the ground x5 each bilat     PATIENT EDUCATION:  Education details: PT eval findings, anticipated POC, initial HEP, and role of DN  Person educated: Patient Education method: Explanation, Demonstration, and Handouts Education comprehension: verbalized understanding and returned demonstration  HOME EXERCISE PROGRAM: Access Code: [email or web address] URL: [email or web address] Date: 03/25/2023 Prepared by: Raynelle Fanning  Exercises - Seated Cervical Rotation AROM  - 1 x daily - 7 x weekly - 1 sets - 10 reps - 10 hold - Seated Cervical Sidebending AROM  - 1 x daily - 7 x weekly - 1 sets - 10 reps - 5 hold - Seated Cervical Extension AROM  - 1 x daily - 7 x weekly - 1 sets - 10 reps - 10 hold - Seated Scapular Retraction  - 1 x daily - 7 x weekly - 1-3 sets - 10 reps - 2-3 sec hold - Sidelying Thoracic Rotation with Open Book  - 1 x daily - 7 x weekly - 2 sets - 5 reps - Seated Thoracic Extension Arms Overhead  - 1 x daily - 7 x weekly - 1-2 sets - 10 reps - 2-3 sec hold - Seated Trunk Rotation  - 1 x daily - 7 x weekly - 1-2 sets - 10 reps - 2-3 sec hold - Seated Diagonal Chops with Medicine Ball  - 1 x daily - 7 x weekly - 1-2 sets - 10 reps - 2-3 hold  ASSESSMENT:  CLINICAL IMPRESSION: Mr Romick had no adverse response to last session.  He continues to show improved tolerance to higher level  exercise.  He has been having some right rib pain where there is a lesion.  This has been worsening over the past few weeks.  He will be seeing MD for right rib pain.   He would benefit from continuing skilled PT for generalized strengthening and flexibility to restore patient to PLOF.    OBJECTIVE IMPAIRMENTS: decreased activity tolerance, decreased ROM, decreased strength, increased muscle spasms, impaired flexibility, impaired UE functional use, postural dysfunction, and pain.   ACTIVITY LIMITATIONS: lifting, sleeping, bathing, dressing, and reach over head  PARTICIPATION LIMITATIONS: driving and golf  PERSONAL FACTORS: Fitness, Past/current experiences, and 3+ comorbidities: multiple myeloma, previous fractures  are also affecting patient's functional outcome.   REHAB POTENTIAL: Good  CLINICAL DECISION MAKING: Evolving/moderate complexity  EVALUATION COMPLEXITY: Moderate   GOALS: Goals reviewed with patient? Yes  SHORT TERM GOALS: Target date: 04/22/2023   Patient will be independent with initial HEP.  Baseline:  Goal status: MET 05/03/23 2.  Improved mobility in thoracic spine by 50% with exercises.  Baseline:  Goal status: MET 05/03/23   LONG TERM GOALS: Target  date: 05/20/2023   Patient will be independent with advanced/ongoing HEP to improve outcomes and carryover.  Baseline:  Goal status: In progress  2.  Patient will report 50% improvement in neck and upper back pain to improve QOL.  Baseline:  Goal status: MET 05/19/23  3.  Patient will demonstrate improved cervical extension to 10 deg Baseline:  Goal status:MET 05/19/23  4.  Patient will demonstrate improved upper back strength as evidenced by improved sitting and standing posture. Baseline:  Goal status:In Progress  5.  Patient will report 7/50 on NDI to demonstrate improved functional ability.  Baseline:  Goal status: Ongoing (see above)   PLAN:  PT FREQUENCY: 2x/week  PT DURATION: 8  weeks  PLANNED INTERVENTIONS: 97164- PT Re-evaluation, 97110-Therapeutic exercises, 97530- Therapeutic activity, [id number]- Neuromuscular re-education, 97535- Self Care, [id number]- Manual therapy, [id number]- Aquatic Therapy, 97014- Electrical stimulation (unattended), Patient/Family education, Taping, Dry Needling, Cryotherapy, and Moist heat  PLAN FOR NEXT SESSION: Continue 1-2 times per week x 4 weeks.  No mobilizations due to previous fractures, monitor right hip status, work on thoracic and cervical mobility,  postural strength, hip mobility.     Victorino Dike B. Vernette Moise, PT 05/25/23 9:36 PM [facility] [address], [address] [place], [place] [address] Phone # [phone] Fax [phone]

## 2023-05-25 NOTE — Progress Notes (Addendum)
[facility] [phone]   [phone]  PROGRESS NOTE  Patient Care Team: Travis Aspen, MD as PCP - General (Internal Medicine) Travis Riffle, MD as PCP - Cardiology (Cardiology) Travis Reichert, MD as PCP - Sleep Medicine (Cardiology) Travis Prude, MD as PCP - Electrophysiology (Cardiology) Travis Soda, MD as Consulting Physician (General Surgery) Travis Rigger, MD as Consulting Physician (Gastroenterology) Travis Reichert, MD as Consulting Physician (Sleep Medicine) Travis Chard, DO as Consulting Physician (Neurology)  Hematological/Oncological History # IgA Kappa Multiple Myeloma 05/05/2020: Bone marrow biopsy showed increased number of plasma cells representing 14% of all cells in the aspirate associated with numerous variably sized clusters in the clot and biopsy sections 04/10/2021: M protein 0.3, IgA kappa specificity. Kappa 580.5, Labda 7.0, ratio 82.93.   12/07/2021: Labs show of 1971.3, lambda 5.5, ratio 358.42 12/10/2021: Transition care to Dr. Leonides [name]. 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  01/26/2022: Cycle 2 Day 1 of Dara/Rev/Dex 02/22/2022: Cycle 3 Day 1 of Dara/Rev/Dex 03/23/2022: Cycle 4 Day 1 of Dara/Rev/Dex 04/20/2022: Cycle 5 Day 1 of Dara/Rev/Dex 05/18/2022: Cycle 6 Day 1 of Dara/Rev/Dex 05/27/2022: MRI cervical/thoracic/lumbar: pathologic fracture at T2 with significant osseous retropulsion and resulting cord compression. Additional smaller lesions within T1 vertebral body, right C7 and T3 articulating facets.  Small Multiple Myeloma lesions scattered at all lumbar levels and in the visible left sacral ala. Largest lumbar tumor is 2.3 cm in the left L1 posterior elements and pedicle. No lumbar extraosseous or epidural tumor extension, or pathologic fracture.Underlying chronic lumbar spine  degeneration, with a new central disc herniation at L3-L4 since 2021 now resulting in mild to moderate degenerative spinal stenosis there. 05/28/2022-06/09/2022: Admitted for T2 pathologic fracture and cord compression. He received urgent radiation therapy for a total of 3.0 Gy. He underwent  thoracic 1 through 4 posterior lateral arthrodesis/fusion and 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  06/23/2022: Cycle 1 Day 1 of Velcade/Pomalyst/Dex 08/04/2022: Cycle 2 Day 1 of Velcade/Pomalyst/Dex. Pomalyst to be decreased to 2mg  PO daily due to cytopenias.  09/01/2022: Cycle 3 Day 1 of Velcade/Pomalyst/Dex. (Missed 2nd dose of cycle due to hospitalization)  10/06/2022: Cycle 4 Day 1 of Velcade/Pomalyst/Dex.  10/27/2022: Cycle 5 Day 1 of Velcade/Pomalyst/Dex.  12/02/2022: Cycle 6 Day 1 of Velcade/Pomalyst/Dex. 01/12/2023: Cycle 1 Day 1 of Kyprolis/Dex 02/09/2023: Cycle 2 Day 1 of Kyprolis/Dex   04/07/2023: Transitioned to selinexor and Pomalyst per request of transplant team due to progression on Kyprolis and dexamethasone  Interval History:  Travis [name] 73 y.o. male with medical history significant for IgA Kappa Multiple Myeloma who presents for a follow up visit. He was last seen on 05/11/2023. In the interim, he continued on Kyprolis/Dex therapy.  On exam today Mr. Travis [name] reports he is eager to get his CAR-T treatments underway.  He reports that he is tolerating his selinexor and Pomalyst quite well though still in XR can cause some rough symptoms.  He reports that his energy level has been okay and his appetite has been good.  He is having more right sided rib pain I would like to have this evaluated with imaging.  He reports that he just feels "yucky" on the selinexor.  He notes that his appetite is reduced and his energy levels are low.  His only last for about 1 to 2 days.  Otherwise he has been  well and is willing and able to continue with treatment at this time.  He denies any  infectious symptoms such as runny nose, sore throat, or cough.  He denies fevers, chills, sweats, shortness of breath, chest pain or cough. He has no other complaints. Rest of the 10 point ROS is below.     MEDICAL HISTORY:  Past Medical History:  Diagnosis Date   A-fib [facility])    Arthritis    "comes and goes; mostly in hands, occasionally elbow" (04/05/2017)   Complication of anesthesia    "just wanted to sleep alot  for couple days S/P VARICOCELE OR" (04/05/2017)   DVT (deep venous thrombosis) (HCC)    LLE   Dysrhythmia    PVCs    GERD (gastroesophageal reflux disease)    History of hiatal hernia    History of radiation therapy    Thoracic Spine- 07/13/22-07/23/22-Dr. Antony [name]   Hypertension    Impaired glucose tolerance    Multiple myeloma (HCC)    Obesity (BMI 30-39.9) 07/26/2016   OSA on CPAP 07/26/2016   Mild with AHI 12.5/hr now on CPAP at 14cm H2O   PAF (paroxysmal atrial fibrillation) (HCC)    s/p PVI Ablation in 2018 // Flecainide Rx // Echo 8/21: EF 60-65, no RWMA, mild LVH, normal RV SF, moderate RVE, mild BAE, trivial MR, mild dilation of aortic root (40 mm)   Pneumonia    "may have had walking pneumonia a few years ago" (04/05/2017)   Pre-diabetes    Thrombophlebitis     SURGICAL HISTORY: Past Surgical History:  Procedure Laterality Date   ATRIAL FIBRILLATION ABLATION  04/05/2017   ATRIAL FIBRILLATION ABLATION N/A 04/05/2017   Procedure: ATRIAL FIBRILLATION ABLATION;  Surgeon: Travis Range, MD;  [facility]: [facility];  Service: Cardiovascular;  Laterality: N/A;   BONE BIOPSY Right 12/17/2021   Procedure: BONE BIOPSY;  Surgeon: Travis Pippin, MD;  Location: [facility];  Service: Orthopedics;  Laterality: Right;   COMPLETE RIGHT HIP REPLACMENT  01/19/2019   EVALUATION UNDER ANESTHESIA WITH HEMORRHOIDECTOMY N/A 02/24/2017   Procedure: EXAM UNDER ANESTHESIA WITH  HEMORRHOIDECTOMY;  Surgeon: Travis Soda, MD;  Location: [facility];  Service:  General;  Laterality: N/A;   EXCISIONAL TOTAL HIP ARTHROPLASTY WITH ANTIBIOTIC SPACERS Right 09/25/2020   Procedure: EXCISIONAL TOTAL HIP ARTHROPLASTY WITH ANTIBIOTIC SPACERS;  Surgeon: [name], MD;  Location: [facility];  Service: Orthopedics;  Laterality: Right;   FLEXIBLE SIGMOIDOSCOPY N/A 04/15/2015   Procedure:  UNSEDATED FLEXIBLE SIGMOIDOSCOPY;  Surgeon: [name], MD;  Location: [facility];  Service: Endoscopy;  Laterality: N/A;   HUMERUS IM NAIL Right 12/17/2021   Procedure: INTRAMEDULLARY (IM) NAIL HUMERAL;  Surgeon: Travis Pippin, MD;  Location: [facility];  Service: Orthopedics;  Laterality: Right;   KNEE ARTHROSCOPY Left ~ 2016   POSTERIOR CERVICAL FUSION/FORAMINOTOMY N/A 06/04/2022   Procedure: Thoracic one - Thoracic Four Posterior lateral arthrodesis/ fusion and Thoracic two Corpectomy;  Surgeon: [name], MD;  Location: [facility];  Service: Neurosurgery;  Laterality: N/A;   REIMPLANTATION OF TOTAL HIP Right 12/25/2020   Procedure: REIMPLANTATION/REVISION OF RIGHT TOTAL HIP VERSUS REPEAT IRRIGATION AND DEBRIDEMENT;  Surgeon: [name], MD;  Location: [facility];  Service: Orthopedics;  Laterality: Right;   TESTICLE SURGERY  1988   VARICOCELE   TONSILLECTOMY     VARICOSE VEIN SURGERY Bilateral     SOCIAL HISTORY: Social History   Socioeconomic History   Marital status: Married  Spouse name: Not on file   Number of children: 1   Years of education: law school   Highest education level: Not on file  Occupational History   Occupation: lawyer  Tobacco Use   Smoking status: Never   Smokeless tobacco: Never  Vaping Use   Vaping status: Never Used  Substance and Sexual Activity   Alcohol use: Yes    Alcohol/week: 2.0 standard drinks of alcohol    Types: 2 Glasses of wine per week    Comment: 2 glasses of wine a week, occ bourbon, beer   Drug use: No   Sexual activity: Not Currently  Other Topics Concern   Not on file  Social History Narrative    Lives in [place] with spouse in a 2 story home.  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 1 daughter.     Education: Social worker school.    Social Drivers of Corporate investment banker Strain: Not on file  Food Insecurity: No Food Insecurity (09/27/2022)   Hunger Vital Sign    Worried About Running Out of Food in the Last Year: Never true    Ran Out of Food in the Last Year: Never true  Transportation Needs: No Transportation Needs (09/27/2022)   PRAPARE - Administrator, Civil Service (Medical): No    Lack of Transportation (Non-Medical): No  Physical Activity: Not on file  Stress: Not on file  Social Connections: Unknown (09/15/2021)   [facility], [facility]   Social Network    Social Network: Not on file  Intimate Partner Violence: Not At Risk (09/27/2022)   Humiliation, Afraid, Rape, and Kick questionnaire    Fear of Current or Ex-Partner: No    Emotionally Abused: No    Physically Abused: No    Sexually Abused: No    FAMILY HISTORY: Family History  Problem Relation Age of Onset   Dementia Mother    Stroke Father    Atrial fibrillation Father    Hypertension Father    Hypertension Paternal Grandfather    Dementia Maternal Grandmother     ALLERGIES:  is allergic to privigen [immune globulin (human)], linezolid, and vancomycin.  MEDICATIONS:  Current Outpatient Medications  Medication Sig Dispense Refill   albuterol (VENTOLIN HFA) 108 (90 Base) MCG/ACT inhaler Inhale 2 puffs into the lungs every 6 (six) hours as needed for wheezing or shortness of breath. 8 g 0   amiodarone (PACERONE) 200 MG tablet Take 200 mg by mouth daily.     calcium carbonate (TUMS - DOSED IN MG ELEMENTAL CALCIUM) 500 MG chewable tablet Chew 1 tablet by mouth daily.     cholecalciferol (VITAMIN D3) 25 MCG (1000 UNIT) tablet Take 1,000 Units by mouth daily.     Cyanocobalamin (VITAMIN B-12 PO) Take 1,000 mcg by mouth daily.     dexamethasone  (DECADRON) 4 MG tablet Take 5 tablets (20 mg total) by mouth every 7 (seven) days. Take 5 tabs (20 mg) by mouth once a day in the morning of chemo on Wednesdays- or unless otherwise instructed 20 tablet 3   ELIQUIS 5 MG TABS tablet TAKE ONE TABLET BY MOUTH TWICE A DAY 60 tablet 6   fluticasone (FLONASE) 50 MCG/ACT nasal spray Place 1 spray into both nostrils daily as needed for allergies or rhinitis.     gabapentin (NEURONTIN) 300 MG capsule TAKE TWO CAPSULES BY MOUTH TWICE A DAY 360 capsule 1   hydrALAZINE (APRESOLINE) 100 MG  tablet Take 1 tablet (100 mg total) by mouth 3 (three) times daily. 270 tablet 3   ketoconazole (NIZORAL) 2 % cream as needed for irritation.     metolazone (ZAROXOLYN) 2.5 MG tablet Take 1 tablet (2.5 mg total) by mouth every other day. (Patient taking differently: Take 2.5 mg by mouth 3 times/day as needed-between meals & bedtime (edema). Taking 1/2 ever other day or every three days)     polyethylene glycol powder (GLYCOLAX/MIRALAX) 17 GM/SCOOP powder Take 1 g by mouth daily.     pomalidomide (POMALYST) 1 MG capsule Take 1 capsule (1 mg total) by mouth daily. [employer] Auth # [id number] Date Obtained 05/23/23 Take 1 capsule daily for 21 days then none for 7 days 21 capsule 0   potassium chloride SA (KLOR-CON M) 20 MEQ tablet TAKE TWO TABLETS BY MOUTH ONE TIME DAILY - TAKE AN EXTRA TABLET ON THE DAY YOU TAKE THE METOLAZONE ( TOTAL) (Patient taking differently: Take 40 mEq by mouth 3 (three) times daily.) 225 tablet 3   psyllium (METAMUCIL) 58.6 % powder Take by mouth at bedtime. 3 tsp daily hs     sacubitril-valsartan (ENTRESTO) 49-51 MG Take 1 tablet by mouth 2 (two) times daily.     selinexor (XPOVIO) Therapy Pack (60 mg once weekly) Take 1 tablet (60 mg total) by mouth once a week. 4 tablet 0   sodium chloride (OCEAN) 0.65 % SOLN nasal spray Place 1 spray into both nostrils as needed for congestion.     spironolactone (ALDACTONE) 25 MG tablet Take 1 tablet (25 mg total) by  mouth daily.     torsemide (DEMADEX) 20 MG tablet Take 2 tablets (40 mg total) by mouth daily. 90 tablet 3   traMADol (ULTRAM) 50 MG tablet Take 1 tablet (50 mg total) by mouth every 6 (six) hours as needed. 30 tablet 0   No current facility-administered medications for this visit.    REVIEW OF SYSTEMS:   Constitutional: ( - ) fevers, ( - )  chills , ( - ) night sweats Eyes: ( - ) blurriness of vision, ( - ) double vision, ( - ) watery eyes Ears, nose, mouth, throat, and face: ( - ) mucositis, ( - ) sore throat Respiratory: ( - ) cough, ( - ) dyspnea, ( - ) wheezes Cardiovascular: ( - ) palpitation, ( - ) chest discomfort, ( - ) lower extremity swelling Gastrointestinal:  ( - ) nausea, ( - ) heartburn, ( - ) change in bowel habits Skin: ( - ) abnormal skin rashes Lymphatics: ( - ) new lymphadenopathy, ( - ) easy bruising Neurological: ( - ) numbness, ( - ) tingling, ( - ) new weaknesses Behavioral/Psych: ( - ) mood change, ( - ) new changes  All other systems were reviewed with the patient and are negative.  PHYSICAL EXAMINATION: ECOG PERFORMANCE STATUS: 1 - Symptomatic but completely ambulatory  Vitals:   05/25/23 1145  BP: (!) 104/45  Pulse: (!) 50  Resp: 16  Temp: 98.1 F (36.7 C)  SpO2: 99%       Filed Weights   05/25/23 1145  Weight: 271 lb 4.8 oz (123.1 kg)    GENERAL: Well-appearing elderly Caucasian male, alert, no distress and comfortable SKIN: no overt abnormalities of the skin.  EYES: conjunctiva are pink and non-injected, sclera clear LUNGS: clear to auscultation and percussion with normal breathing effort HEART: tachycardic &  no murmurs. +bilateral trace lower extremity edema Musculoskeletal: no cyanosis of digits and no  clubbing  PSYCH: alert & oriented x 3, fluent speech NEURO: no focal motor/sensory deficits  LABORATORY DATA:  I have reviewed the data as listed    Latest Ref Rng & Units 05/25/2023   10:34 AM 05/11/2023    9:33 AM 04/20/2023    10:12 AM  CBC  WBC 4.0 - 10.5 K/uL 7.4  7.2  7.9   Hemoglobin 13.0 - 17.0 g/dL 16.1  09.6  04.5   Hematocrit 39.0 - 52.0 % 35.2  32.8  35.8   Platelets 150 - 400 K/uL 148  187  165        Latest Ref Rng & Units 05/25/2023   10:34 AM 05/11/2023    9:33 AM 04/20/2023   10:12 AM  CMP  Glucose 70 - 99 mg/dL 409  811  93   BUN 8 - 23 mg/dL 41  29  24   Creatinine 0.61 - 1.24 mg/dL 9.14  7.82  9.56   Sodium 135 - 145 mmol/L 137  137  140   Potassium 3.5 - 5.1 mmol/L 3.8  4.0  3.6   Chloride 98 - 111 mmol/L 103  104  104   CO2 22 - 32 mmol/L 27  27  29    Calcium 8.9 - 10.3 mg/dL 9.3  8.9  8.9   Total Protein 6.5 - 8.1 g/dL 6.1  5.9  6.0   Total Bilirubin 0.0 - 1.2 mg/dL 0.5  0.5  0.4   Alkaline Phos 38 - 126 U/L 71  63  73   AST 15 - 41 U/L 14  15  16    ALT 0 - 44 U/L 17  23  19      Lab Results  Component Value Date   MPROTEIN Not Observed 05/11/2023   MPROTEIN Not Observed 04/20/2023   MPROTEIN Not Observed 04/07/2023   Lab Results  Component Value Date   KPAFRELGTCHN 212.7 (H) 05/11/2023   KPAFRELGTCHN 390.9 (H) 04/20/2023   KPAFRELGTCHN 523.0 (H) 04/07/2023   LAMBDASER 4.4 (L) 05/11/2023   LAMBDASER 8.3 04/20/2023   LAMBDASER 2.7 (L) 04/07/2023   KAPLAMBRATIO 48.34 (H) 05/11/2023   KAPLAMBRATIO 47.10 (H) 04/20/2023   KAPLAMBRATIO 193.70 (H) 04/07/2023     RADIOGRAPHIC STUDIES: No results found.   ASSESSMENT & PLAN Travis [name] is a 73 y.o. male with medical history significant for IgA Kappa Multiple Myeloma who presents for a follow up visit.   # IgA Kappa Multiple Myeloma --Metastatic survey completed 12/15/2021 --Patient underwent fixation of his humerus on 12/17/2021, pathology confirmed plasmacytoma/plasma cell neoplasm.  --Received 6 cycles of Darzalex, Revlimid, and dexamethasone started on 12/28/2021-05/18/2022.  --Due to worsening myeloma osseous lesions including pathologic fracture at T2 with cord compression, Dr. Leonides [name] recommends changing therapy to  Velcade/Pomalyst/Dex.  --Due to rising kappa free light chains, recommend to change therapy to Carfilzomib/Dex started on 01/12/2023. Plan: --continuing Selinexor/Pom/Dex per [facility] recommendations in anticipation of CAR-T.  Intending for this to start on 06/20/2023. --labs from today showed white blood cell 7.4, hemoglobin 11.4, MCV 102.6, platelets 148 --kappa 212.7, lambda 4.4, ratio 48.34. --Proceed with treatment without any dose modifications.  --RTC in 2 weeks for labs and 4 weeks clinic visit with interval continued PO treatment.  Treatment will stop prior to his CAR-T therapy on 06/20/2023.  #Right Sided Rib Pain -- Patient has right sided rib pain which has been worsening over the last several weeks. -- Will order an x-ray of the ribs to assess for worsening lytic lesions or  rib fracture/damage. -- Can consider radiation therapy prior to the start of CAR-T if there are lesions amenable to radiation.  # Neck pain and mid back pain: --Suspect pain is exacerbated from recent hospitalization and being in bed/sitting for long periods.  --MRI C/T/L spine obtinaed that showed unchanged lytic lesions in the right C7 inferior articular process,T6, T8, and T10  vertebral bodies, as well  as the T11 spinous process. Unchanged  multilevel cervical spondylsis, Interval enlargement of disc herniation at L3-L4 with progressive spinal canal stenosis. No new or progressive lytic lesions.  --Encouraged to continue with physical therapy and follow up with ortho.   #Palliative Radiation Therapy: # Dysphagia --Received palliative radiation from 01/06/2022-01/19/2022 to right hymerus, right forearm and right chest/rib, 20 Gy in 10 Fx.  --recieved palliative radiation to surgical bed in the upper thoracic spine area on 07/13/2022.  27 Gy in 9 Fx.   #Lower extremity edema--improved: --Currently on lasix and torsemide  # Rash Concerning for Shingles-resolved -- Prescribed valacyclovir 3 times daily 1000 mg x 10  days-completed the course -- We will continue to monitor rash closely.  #DVT: --Found to have acute DVT involving left peroneal veins. Likely secondary to recent surgery and hospitalization --Currently on eliquis 5 mg BID.    #Hypokalemia: --Potassium level is 3.8 today. Advised to continue with 40 mEq twice daily and increase to 60 mEq on the days he takes torsemide/spironolactone  #Rash on scalp and right forearm: --Recommend topical hydrocortisone cream for spot treatment  #Supportive Care -- chemotherapy education complete -- port placement not required -- zofran 8mg  q8H PRN and compazine 10mg  PO q6H for nausea -- acyclovir 400mg  PO BID for VCZ prophylaxis -- Started Zometa q 12 weeks on 11/10/2022. Next dose due today.   No orders of the defined types were placed in this encounter.  All questions were answered. The patient knows to call the clinic with any problems, questions or concerns.  A total of more than 30 minutes were spent on this encounter with face-to-face time and non-face-to-face time, including preparing to see the patient, ordering tests and/or medications, counseling the patient and coordination of care as outlined above.   Ulysees Barns, MD Department of Hematology/Oncology [facility] Phone: [phone] Pager: [phone] Email: [email or web address]  05/25/2023 4:56 PM

## 2023-05-25 NOTE — Telephone Encounter (Signed)
TCT Wilburt Finlay, RN , CAR-T coordinator @ [facility]. Regarding pt's Selinexor and Pomalyst. Spoke with her. Asked when pt should take his last doses of these 2 medications prior to his CAR-T treatment @ [facility] scheduled for 06/20/23 if all goes as anticipated.   She states he needs a 2 week 'washout' prior to Car-T. Pt has enough Selinexor for 2 more weekly doses and finishes his current pomalyst on 06/30/23.  She states after he finishes current medications , he will not need further refills.  Explained this to patient during his visit today. He voiced understanding.

## 2023-05-26 ENCOUNTER — Other Ambulatory Visit: Payer: Self-pay | Admitting: Hematology and Oncology

## 2023-05-26 DIAGNOSIS — C9000 Multiple myeloma not having achieved remission: Secondary | ICD-10-CM

## 2023-05-27 ENCOUNTER — Ambulatory Visit: Payer: Medicare Other

## 2023-05-27 DIAGNOSIS — M542 Cervicalgia: Secondary | ICD-10-CM

## 2023-05-27 DIAGNOSIS — M6281 Muscle weakness (generalized): Secondary | ICD-10-CM | POA: Diagnosis not present

## 2023-05-27 DIAGNOSIS — R29898 Other symptoms and signs involving the musculoskeletal system: Secondary | ICD-10-CM | POA: Diagnosis not present

## 2023-05-27 DIAGNOSIS — M546 Pain in thoracic spine: Secondary | ICD-10-CM | POA: Diagnosis not present

## 2023-05-27 DIAGNOSIS — R293 Abnormal posture: Secondary | ICD-10-CM | POA: Diagnosis not present

## 2023-05-27 DIAGNOSIS — R252 Cramp and spasm: Secondary | ICD-10-CM

## 2023-05-27 DIAGNOSIS — C9000 Multiple myeloma not having achieved remission: Secondary | ICD-10-CM

## 2023-05-27 DIAGNOSIS — S22000A Wedge compression fracture of unspecified thoracic vertebra, initial encounter for closed fracture: Secondary | ICD-10-CM | POA: Diagnosis not present

## 2023-05-27 DIAGNOSIS — R262 Difficulty in walking, not elsewhere classified: Secondary | ICD-10-CM

## 2023-05-27 NOTE — Therapy (Signed)
OUTPATIENT PHYSICAL THERAPY NECK AND THORACIC TREATMENT  Patient Name: Travis [name] MRN: [id number] DOB:03-21-1951, 73 y.o., male Today's Date: 05/27/2023  END OF SESSION:  PT End of Session - 05/27/23 0941     Visit Number 11    Date for PT Re-Evaluation 06/17/23    Authorization Type [employer] MCR    Progress Note Due on Visit 20    PT Start Time 0933    PT Stop Time 1015    PT Time Calculation (min) 42 min    Activity Tolerance Patient limited by fatigue    Behavior During Therapy [facility] for tasks assessed/performed              Past Medical History:  Diagnosis Date   A-fib (HCC)    Arthritis    "comes and goes; mostly in hands, occasionally elbow" (04/05/2017)   Complication of anesthesia    "just wanted to sleep alot  for couple days S/P VARICOCELE OR" (04/05/2017)   DVT (deep venous thrombosis) (HCC)    LLE   Dysrhythmia    PVCs    GERD (gastroesophageal reflux disease)    History of hiatal hernia    History of radiation therapy    Thoracic Spine- 07/13/22-07/23/22-Dr. Antony Blackbird   Hypertension    Impaired glucose tolerance    Multiple myeloma (HCC)    Obesity (BMI 30-39.9) 07/26/2016   OSA on CPAP 07/26/2016   Mild with AHI 12.5/hr now on CPAP at 14cm H2O   PAF (paroxysmal atrial fibrillation) (HCC)    s/p PVI Ablation in 2018 // Flecainide Rx // Echo 8/21: EF 60-65, no RWMA, mild LVH, normal RV SF, moderate RVE, mild BAE, trivial MR, mild dilation of aortic root (40 mm)   Pneumonia    "may have had walking pneumonia a few years ago" (04/05/2017)   Pre-diabetes    Thrombophlebitis    Past Surgical History:  Procedure Laterality Date   ATRIAL FIBRILLATION ABLATION  04/05/2017   ATRIAL FIBRILLATION ABLATION N/A 04/05/2017   Procedure: ATRIAL FIBRILLATION ABLATION;  Surgeon: [name], MD;  Location: [facility];  Service: Cardiovascular;  Laterality: N/A;   BONE BIOPSY Right 12/17/2021   Procedure: BONE BIOPSY;  Surgeon: [name], MD;   Location: [facility];  Service: Orthopedics;  Laterality: Right;   COMPLETE RIGHT HIP REPLACMENT  01/19/2019   EVALUATION UNDER ANESTHESIA WITH HEMORRHOIDECTOMY N/A 02/24/2017   Procedure: EXAM UNDER ANESTHESIA WITH  HEMORRHOIDECTOMY;  Surgeon: [name], MD;  Location: [facility];  Service: General;  Laterality: N/A;   EXCISIONAL TOTAL HIP ARTHROPLASTY WITH ANTIBIOTIC SPACERS Right 09/25/2020   Procedure: EXCISIONAL TOTAL HIP ARTHROPLASTY WITH ANTIBIOTIC SPACERS;  Surgeon: [name], MD;  Location: [facility];  Service: Orthopedics;  Laterality: Right;   FLEXIBLE SIGMOIDOSCOPY N/A 04/15/2015   Procedure:  UNSEDATED FLEXIBLE SIGMOIDOSCOPY;  Surgeon: [name], MD;  Location: [facility];  Service: Endoscopy;  Laterality: N/A;   HUMERUS IM NAIL Right 12/17/2021   Procedure: INTRAMEDULLARY (IM) NAIL HUMERAL;  Surgeon: [name], MD;  Location: [facility];  Service: Orthopedics;  Laterality: Right;   KNEE ARTHROSCOPY Left ~ 2016   POSTERIOR CERVICAL FUSION/FORAMINOTOMY N/A 06/04/2022   Procedure: Thoracic one - Thoracic Four Posterior lateral arthrodesis/ fusion and Thoracic two Corpectomy;  Surgeon: [name], MD;  Location: [facility];  Service: Neurosurgery;  Laterality: N/A;   REIMPLANTATION OF TOTAL HIP Right 12/25/2020   Procedure: REIMPLANTATION/REVISION OF RIGHT TOTAL HIP VERSUS REPEAT IRRIGATION AND  DEBRIDEMENT;  Surgeon: [name], MD;  Location: [facility];  Service: Orthopedics;  Laterality: Right;   TESTICLE SURGERY  1988   VARICOCELE   TONSILLECTOMY     VARICOSE VEIN SURGERY Bilateral    Patient Active Problem List   Diagnosis Date Noted   Pancreatic cyst 09/30/2022   Infection due to novel influenza A virus 09/29/2022   Persistent atrial fibrillation (HCC) 07/15/2022   Acute on chronic diastolic heart failure (HCC) 07/03/2022   Pulmonary infiltrates 07/01/2022   Acute respiratory failure with hypoxia (HCC) 07/01/2022   Hyponatremia  06/29/2022   History of thoracic surgery 06/29/2022   Acute DVT of left tibial vein (HCC) 06/21/2022   Hypokalemia 06/21/2022   Vitamin D deficiency 05/30/2022   Other constipation 05/29/2022   Pathologic compression fracture of spine, initial encounter (HCC) 05/29/2022   Cord compression (HCC) 05/28/2022   Degenerative cervical disc 05/14/2022   Hypercoagulable state due to persistent atrial fibrillation (HCC) 01/11/2022   Multiple myeloma (HCC) 12/10/2021   Medication monitoring encounter 02/27/2021   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   Status post peripherally inserted central catheter (PICC) central line placement 11/06/2020   Drug rash with eosinophilia and systemic symptoms syndrome 11/06/2020   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   PAF (paroxysmal atrial fibrillation) (HCC)    GERD (gastroesophageal reflux disease) 03/29/2018   Hiatal hernia 03/29/2018   History of DVT (deep vein thrombosis) 03/29/2018   CAP (community acquired pneumonia) 03/29/2018   Osteoarthritis 03/29/2018   Neuropathy 08/29/2017   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   Paroxysmal atrial fibrillation with rapid ventricular response (HCC) 04/05/2017   Prolapsed internal hemorrhoids, grade 4, s/p ligation/pexy/hemorrhoidectomy x 2 02/24/2017 02/24/2017   OSA (obstructive sleep apnea) 07/26/2016   Obesity (BMI 30-39.9) 07/26/2016   Atrial fibrillation with RVR (HCC)    Essential hypertension    Chest pain at rest     PCP: [name], MD   REFERRING PROVIDER: Jaci Standard, MD   REFERRING DIAG: C90.00 (ICD-10-CM) - Multiple myeloma not having achieved remission (HCC)   THERAPY DIAG:  Difficulty in walking, not elsewhere classified  Muscle weakness (generalized)  Abnormal posture  Pain in thoracic spine  Cramp and spasm  Cervicalgia  Multiple myeloma not having achieved remission [facility])  Rationale for Evaluation and Treatment:  Rehabilitation  ONSET DATE: Feb 2024   SUBJECTIVE:   SUBJECTIVE STATEMENT: Patient reports still not feeling great.  Right low back and right hip are hurting worse than the last time I was here.       PERTINENT HISTORY: significant for IgA Kappa Multiple Myeloma. He was diagnosed with smouldering myeloma in 2018 . July 2023 he developed right arm pain due to a pathologic fx and he underwent radiation and fixation. In early Feb 2024 he had radiation for pathologic T2 compression fx followed by a T1-T4 fusion.  PAIN:   05/19/23: Are you having pain? Yes: NPRS scale: 5-6/10 Pain location: R arm and upper traps; between shoulder blades, some low back  Pain description: constant tightness.soreness Aggravating factors: not sure Relieving factors: shoulder rolls, aquatic PT. Hot tub  PRECAUTIONS: Other: Multiple Myeloma with mets to R ribs, R humerus with ORIF for pathological fracture 12/17/21. Lesions have also been found in R rib and also in his skull. Pathologic fx T2 with fusion T1-4 on 06/04/22, Bilateral LE edema (on Lasix), Afib   RED FLAGS: None   WEIGHT BEARING RESTRICTIONS: No  FALLS:  Has patient  fallen in last 6 months? Yes. Number of falls 1 walking to fast and tripped over feet  LIVING ENVIRONMENT: Lives with: lives with their family and lives with their spouse Lives in: House/apartment Stairs: Yes; Internal: 14 steps; on left going up and External: 3 steps; on right going up Has following equipment at home: Single point cane and Walker - 4 wheeled  OCCUPATION: semi-retired, attorney  PLOF: Independent with household mobility with device  PATIENT GOALS: stretch these muscles out  NEXT MD VISIT: 12/4  OBJECTIVE:  Note: Objective measures were completed at Evaluation unless otherwise noted.  DIAGNOSTIC FINDINGS: see chart  PATIENT SURVEYS:  Eval:  NDI 15 / 50 = 30.0 % 05/17/2023:  Neck Disability Index score: 10 / 50 = 20.0 %  COGNITION: Overall cognitive  status: Within functional limits for tasks assessed     SENSATION: Some tingling in his hands and feet   POSTURE: rounded shoulders, forward head, and increased thoracic kyphosis Sits in significant forward head, thoracic kyphosis  PALPATION: Marked tightness in B cervical paraspinals, levator, UT  CERVICAL ROM: * pain  Active ROM A/PROM (deg) eval A/PROM (deg) 05/19/23  Flexion full full  Extension 4 deg 15  Right lateral flexion 17 20  Left lateral flexion 13* 22  Right rotation 38 32  Left rotation 42 40   (Blank rows = not tested)   SHOULDER AROM (tested in sitting): Flex  R 102 deg, L 123 deg ABD R 88 deg, L 124 deg  Functional IR R thumb to L5, L to T10 Functional ER - can reach behind head easily L, must bend neck forward R  05/19/23: Flex  R 116 deg, L 141 deg ABD R 108 deg, L 139 deg  Functional IR R thumb to L5, L to T10 Functional ER -, to C4-5 R,  to C4-5 L  SHOULDER STRENGTH:  Flex R 4+/5, ABD R 4/5  else 5/5 B    TODAY'S TREATMENT:                                                                                                                              DATE:   05/27/2023 Nustep level 1 (decreased level due to elevated pain) x 8 min with PT present to discuss status Supine hook lying moist heat to right low back x 5 min prior to stretching, heat left in place for the following stretches: Hamstring stretch x 10 holding 5-10 seconds (to patient tolerance) using stretch strap bilaterally IT band stretch x 10 holding 5-10 seconds (to patient tolerance) using stretch strap bilaterally Single KTC using strap x 10 holding 5-10 sec each bilaterally Modified piriformis stretch with strap x 5 each LE holding 5-10 sec each  Hook lying trunk rotation x 20  05/25/2023 Nustep level 3 x 8 min with PT present to discuss status Hip Matrix 25#:  hip abduction and hip extension. 2 x 10 bilat Leg press 70 lbs 2 x  20 (needs assist to get feet in place on foot  plate) Standing matrix lat pull down 2 x 10 with 40 lbs Standing matrix tricep press 20 lbs 2 x 10 Standing matrix row 15 lbs 2 x 10 Seated clam with red loop x 20 Seated hamstring curls with red tband x 20 Seated bilateral shoulder ER with red tband x 20 Seated bilateral shoulder horizontal abduction 2 x 10 Seated bilateral bicep curls 2 x 10 with red tband Sit to/from stand  2 x 10 (second set did 2 sets of 5)  05/19/2023 Nustep level 3 x 8 min with PT present to discuss status Re cert assessment Hip Matrix 25#:  hip abduction and hip extension.  X15 bilat Seated hamstring stretch 2 x 30 sec bilat Seated clam with red loop x 20 Sit to/from stand holding 5# kettlebell 2 x 10 (second set x 5)  PATIENT EDUCATION:  Education details: PT eval findings, anticipated POC, initial HEP, and role of DN  Person educated: Patient Education method: Explanation, Demonstration, and Handouts Education comprehension: verbalized understanding and returned demonstration  HOME EXERCISE PROGRAM: Access Code: [email or web address] URL: [email or web address] Date: 03/25/2023 Prepared by: Raynelle Fanning  Exercises - Seated Cervical Rotation AROM  - 1 x daily - 7 x weekly - 1 sets - 10 reps - 10 hold - Seated Cervical Sidebending AROM  - 1 x daily - 7 x weekly - 1 sets - 10 reps - 5 hold - Seated Cervical Extension AROM  - 1 x daily - 7 x weekly - 1 sets - 10 reps - 10 hold - Seated Scapular Retraction  - 1 x daily - 7 x weekly - 1-3 sets - 10 reps - 2-3 sec hold - Sidelying Thoracic Rotation with Open Book  - 1 x daily - 7 x weekly - 2 sets - 5 reps - Seated Thoracic Extension Arms Overhead  - 1 x daily - 7 x weekly - 1-2 sets - 10 reps - 2-3 sec hold - Seated Trunk Rotation  - 1 x daily - 7 x weekly - 1-2 sets - 10 reps - 2-3 sec hold - Seated Diagonal Chops with Medicine Ball  - 1 x daily - 7 x weekly - 1-2 sets - 10 reps - 2-3 hold  ASSESSMENT:  CLINICAL IMPRESSION: Mr Mcclimans saw MD for scan of right  ribs and there were no new findings.  Fracture still present but no other concerns.  He is hurting more today so we focused on stretching and pain control today.  He reported improvement in pain post session.   He would benefit from continuing skilled PT for generalized strengthening and flexibility to restore patient to PLOF.    OBJECTIVE IMPAIRMENTS: decreased activity tolerance, decreased ROM, decreased strength, increased muscle spasms, impaired flexibility, impaired UE functional use, postural dysfunction, and pain.   ACTIVITY LIMITATIONS: lifting, sleeping, bathing, dressing, and reach over head  PARTICIPATION LIMITATIONS: driving and golf  PERSONAL FACTORS: Fitness, Past/current experiences, and 3+ comorbidities: multiple myeloma, previous fractures  are also affecting patient's functional outcome.   REHAB POTENTIAL: Good  CLINICAL DECISION MAKING: Evolving/moderate complexity  EVALUATION COMPLEXITY: Moderate   GOALS: Goals reviewed with patient? Yes  SHORT TERM GOALS: Target date: 04/22/2023   Patient will be independent with initial HEP.  Baseline:  Goal status: MET 05/03/23 2.  Improved mobility in thoracic spine by 50% with exercises.  Baseline:  Goal status: MET 05/03/23   LONG TERM GOALS: Target date: 05/20/2023   Patient will  be independent with advanced/ongoing HEP to improve outcomes and carryover.  Baseline:  Goal status: In progress  2.  Patient will report 50% improvement in neck and upper back pain to improve QOL.  Baseline:  Goal status: MET 05/19/23  3.  Patient will demonstrate improved cervical extension to 10 deg Baseline:  Goal status:MET 05/19/23  4.  Patient will demonstrate improved upper back strength as evidenced by improved sitting and standing posture. Baseline:  Goal status:In Progress  5.  Patient will report 7/50 on NDI to demonstrate improved functional ability.  Baseline:  Goal status: Ongoing (see above)   PLAN:  PT FREQUENCY:  2x/week  PT DURATION: 8 weeks  PLANNED INTERVENTIONS: 97164- PT Re-evaluation, 97110-Therapeutic exercises, 97530- Therapeutic activity, [id number]- Neuromuscular re-education, 97535- Self Care, [id number]- Manual therapy, [id number]- Aquatic Therapy, 97014- Electrical stimulation (unattended), Patient/Family education, Taping, Dry Needling, Cryotherapy, and Moist heat  PLAN FOR NEXT SESSION: Continue 1-2 times per week x 4 weeks.  No mobilizations due to previous fractures, monitor right hip status, work on thoracic and cervical mobility,  postural strength, hip mobility.     Victorino Dike B. Dawnya Grams, PT 05/27/23 11:36 AM [facility] [address] [place], [place] [address] Phone # [phone] Fax [phone]

## 2023-05-31 ENCOUNTER — Ambulatory Visit: Payer: Medicare Other

## 2023-06-02 ENCOUNTER — Ambulatory Visit: Payer: Medicare Other

## 2023-06-02 DIAGNOSIS — R252 Cramp and spasm: Secondary | ICD-10-CM

## 2023-06-02 DIAGNOSIS — M546 Pain in thoracic spine: Secondary | ICD-10-CM | POA: Diagnosis not present

## 2023-06-02 DIAGNOSIS — S22000A Wedge compression fracture of unspecified thoracic vertebra, initial encounter for closed fracture: Secondary | ICD-10-CM | POA: Diagnosis not present

## 2023-06-02 DIAGNOSIS — C9000 Multiple myeloma not having achieved remission: Secondary | ICD-10-CM | POA: Diagnosis not present

## 2023-06-02 DIAGNOSIS — R262 Difficulty in walking, not elsewhere classified: Secondary | ICD-10-CM

## 2023-06-02 DIAGNOSIS — M6281 Muscle weakness (generalized): Secondary | ICD-10-CM | POA: Diagnosis not present

## 2023-06-02 DIAGNOSIS — M542 Cervicalgia: Secondary | ICD-10-CM

## 2023-06-02 DIAGNOSIS — R29898 Other symptoms and signs involving the musculoskeletal system: Secondary | ICD-10-CM | POA: Diagnosis not present

## 2023-06-02 DIAGNOSIS — R293 Abnormal posture: Secondary | ICD-10-CM | POA: Diagnosis not present

## 2023-06-02 NOTE — Therapy (Signed)
OUTPATIENT PHYSICAL THERAPY NECK AND THORACIC TREATMENT  Patient Name: Travis [name] MRN: [id number] DOB:Nov 26, 1950, 73 y.o., male Today's Date: 06/02/2023  END OF SESSION:  PT End of Session - 06/02/23 1246     Visit Number 12    Date for PT Re-Evaluation 06/17/23    Authorization Type [employer] MCR    PT Start Time 1230    PT Stop Time 1315    PT Time Calculation (min) 45 min    Activity Tolerance Patient limited by fatigue    Behavior During Therapy WFL for tasks assessed/performed              Past Medical History:  Diagnosis Date   A-fib (HCC)    Arthritis    "comes and goes; mostly in hands, occasionally elbow" (04/05/2017)   Complication of anesthesia    "just wanted to sleep alot  for couple days S/P VARICOCELE OR" (04/05/2017)   DVT (deep venous thrombosis) (HCC)    LLE   Dysrhythmia    PVCs    GERD (gastroesophageal reflux disease)    History of hiatal hernia    History of radiation therapy    Thoracic Spine- 07/13/22-07/23/22-Dr. Antony Blackbird   Hypertension    Impaired glucose tolerance    Multiple myeloma (HCC)    Obesity (BMI 30-39.9) 07/26/2016   OSA on CPAP 07/26/2016   Mild with AHI 12.5/hr now on CPAP at 14cm H2O   PAF (paroxysmal atrial fibrillation) (HCC)    s/p PVI Ablation in 2018 // Flecainide Rx // Echo 8/21: EF 60-65, no RWMA, mild LVH, normal RV SF, moderate RVE, mild BAE, trivial MR, mild dilation of aortic root (40 mm)   Pneumonia    "may have had walking pneumonia a few years ago" (04/05/2017)   Pre-diabetes    Thrombophlebitis    Past Surgical History:  Procedure Laterality Date   ATRIAL FIBRILLATION ABLATION  04/05/2017   ATRIAL FIBRILLATION ABLATION N/A 04/05/2017   Procedure: ATRIAL FIBRILLATION ABLATION;  Surgeon: [name], MD;  Location: [facility];  Service: Cardiovascular;  Laterality: N/A;   BONE BIOPSY Right 12/17/2021   Procedure: BONE BIOPSY;  Surgeon: [name], MD;  Location: [facility];   Service: Orthopedics;  Laterality: Right;   COMPLETE RIGHT HIP REPLACMENT  01/19/2019   EVALUATION UNDER ANESTHESIA WITH HEMORRHOIDECTOMY N/A 02/24/2017   Procedure: EXAM UNDER ANESTHESIA WITH  HEMORRHOIDECTOMY;  Surgeon: [name], MD;  Location: [facility];  Service: General;  Laterality: N/A;   EXCISIONAL TOTAL HIP ARTHROPLASTY WITH ANTIBIOTIC SPACERS Right 09/25/2020   Procedure: EXCISIONAL TOTAL HIP ARTHROPLASTY WITH ANTIBIOTIC SPACERS;  Surgeon: [name], MD;  Location: [facility];  Service: Orthopedics;  Laterality: Right;   FLEXIBLE SIGMOIDOSCOPY N/A 04/15/2015   Procedure:  UNSEDATED FLEXIBLE SIGMOIDOSCOPY;  Surgeon: [name], MD;  Location: [facility];  Service: Endoscopy;  Laterality: N/A;   HUMERUS IM NAIL Right 12/17/2021   Procedure: INTRAMEDULLARY (IM) NAIL HUMERAL;  Surgeon: [name], MD;  Location: [facility];  Service: Orthopedics;  Laterality: Right;   KNEE ARTHROSCOPY Left ~ 2016   POSTERIOR CERVICAL FUSION/FORAMINOTOMY N/A 06/04/2022   Procedure: Thoracic one - Thoracic Four Posterior lateral arthrodesis/ fusion and Thoracic two Corpectomy;  Surgeon: [name], MD;  Location: [facility];  Service: Neurosurgery;  Laterality: N/A;   REIMPLANTATION OF TOTAL HIP Right 12/25/2020   Procedure: REIMPLANTATION/REVISION OF RIGHT TOTAL HIP VERSUS REPEAT IRRIGATION AND DEBRIDEMENT;  Surgeon: [name], MD;  Location: [facility];  Service: Orthopedics;  Laterality: Right;   TESTICLE SURGERY  1988   VARICOCELE   TONSILLECTOMY     VARICOSE VEIN SURGERY Bilateral    Patient Active Problem List   Diagnosis Date Noted   Pancreatic cyst 09/30/2022   Infection due to novel influenza A virus 09/29/2022   Persistent atrial fibrillation (HCC) 07/15/2022   Acute on chronic diastolic heart failure (HCC) 07/03/2022   Pulmonary infiltrates 07/01/2022   Acute respiratory failure with hypoxia (HCC) 07/01/2022   Hyponatremia 06/29/2022   History of thoracic surgery  06/29/2022   Acute DVT of left tibial vein (HCC) 06/21/2022   Hypokalemia 06/21/2022   Vitamin D deficiency 05/30/2022   Other constipation 05/29/2022   Pathologic compression fracture of spine, initial encounter (HCC) 05/29/2022   Cord compression (HCC) 05/28/2022   Degenerative cervical disc 05/14/2022   Hypercoagulable state due to persistent atrial fibrillation (HCC) 01/11/2022   Multiple myeloma (HCC) 12/10/2021   Medication monitoring encounter 02/27/2021   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   Status post peripherally inserted central catheter (PICC) central line placement 11/06/2020   Drug rash with eosinophilia and systemic symptoms syndrome 11/06/2020   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   PAF (paroxysmal atrial fibrillation) (HCC)    GERD (gastroesophageal reflux disease) 03/29/2018   Hiatal hernia 03/29/2018   History of DVT (deep vein thrombosis) 03/29/2018   CAP (community acquired pneumonia) 03/29/2018   Osteoarthritis 03/29/2018   Neuropathy 08/29/2017   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   Paroxysmal atrial fibrillation with rapid ventricular response (HCC) 04/05/2017   Prolapsed internal hemorrhoids, grade 4, s/p ligation/pexy/hemorrhoidectomy x 2 02/24/2017 02/24/2017   OSA (obstructive sleep apnea) 07/26/2016   Obesity (BMI 30-39.9) 07/26/2016   Atrial fibrillation with RVR (HCC)    Essential hypertension    Chest pain at rest     PCP: [name], MD   REFERRING PROVIDER: Jaci Standard, MD   REFERRING DIAG: C90.00 (ICD-10-CM) - Multiple myeloma not having achieved remission (HCC)   THERAPY DIAG:  Pain in thoracic spine  Cervicalgia  Muscle weakness (generalized)  Abnormal posture  Cramp and spasm  Multiple myeloma not having achieved remission (HCC)  Difficulty in walking, not elsewhere classified  Rationale for Evaluation and Treatment: Rehabilitation  ONSET DATE: Feb 2024   SUBJECTIVE:    SUBJECTIVE STATEMENT: Patient reports still not feeling great.   PERTINENT HISTORY: significant for IgA Kappa Multiple Myeloma. He was diagnosed with smouldering myeloma in 2018 . July 2023 he developed right arm pain due to a pathologic fx and he underwent radiation and fixation. In early Feb 2024 he had radiation for pathologic T2 compression fx followed by a T1-T4 fusion.  PAIN:   05/19/23: Are you having pain? Yes: NPRS scale: 5-6/10 Pain location: R arm and upper traps; between shoulder blades, some low back  Pain description: constant tightness.soreness Aggravating factors: not sure Relieving factors: shoulder rolls, aquatic PT. Hot tub  PRECAUTIONS: Other: Multiple Myeloma with mets to R ribs, R humerus with ORIF for pathological fracture 12/17/21. Lesions have also been found in R rib and also in his skull. Pathologic fx T2 with fusion T1-4 on 06/04/22, Bilateral LE edema (on Lasix), Afib   RED FLAGS: None   WEIGHT BEARING RESTRICTIONS: No  FALLS:  Has patient fallen in last 6 months? Yes. Number of falls 1 walking to fast and tripped over feet  LIVING ENVIRONMENT: Lives with: lives with their family and lives with their  spouse Lives in: House/apartment Stairs: Yes; Internal: 14 steps; on left going up and External: 3 steps; on right going up Has following equipment at home: Single point cane and Walker - 4 wheeled  OCCUPATION: semi-retired, attorney  PLOF: Independent with household mobility with device  PATIENT GOALS: stretch these muscles out  NEXT MD VISIT: 12/4  OBJECTIVE:  Note: Objective measures were completed at Evaluation unless otherwise noted.  DIAGNOSTIC FINDINGS: see chart  PATIENT SURVEYS:  Eval:  NDI 15 / 50 = 30.0 % 05/17/2023:  Neck Disability Index score: 10 / 50 = 20.0 %  COGNITION: Overall cognitive status: Within functional limits for tasks assessed     SENSATION: Some tingling in his hands and feet   POSTURE: rounded shoulders,  forward head, and increased thoracic kyphosis Sits in significant forward head, thoracic kyphosis  PALPATION: Marked tightness in B cervical paraspinals, levator, UT  CERVICAL ROM: * pain  Active ROM A/PROM (deg) eval A/PROM (deg) 05/19/23  Flexion full full  Extension 4 deg 15  Right lateral flexion 17 20  Left lateral flexion 13* 22  Right rotation 38 32  Left rotation 42 40   (Blank rows = not tested)   SHOULDER AROM (tested in sitting): Flex  R 102 deg, L 123 deg ABD R 88 deg, L 124 deg  Functional IR R thumb to L5, L to T10 Functional ER - can reach behind head easily L, must bend neck forward R  05/19/23: Flex  R 116 deg, L 141 deg ABD R 108 deg, L 139 deg  Functional IR R thumb to L5, L to T10 Functional ER -, to C4-5 R,  to C4-5 L  SHOULDER STRENGTH:  Flex R 4+/5, ABD R 4/5  else 5/5 B    TODAY'S TREATMENT:                                                                                                                              DATE:   06/02/2023 Nustep x 8 min level 3 Lat pull down 40 lbs 2 x 10 Tricep press 20 lbs 2 x 10 Leg Press 80 lbs 3 x 10 (seat 9) Sit to stand from mat table with balance pad x 5 Supine lying on moist heat pad: lower trunk rotation x 20 Supine hip rotation with assist x 1 min each side Supine march x 20 (All supine exercises lying on moist heat pad)  05/27/2023 Nustep level 1 (decreased level due to elevated pain) x 8 min with PT present to discuss status Supine hook lying moist heat to right low back x 5 min prior to stretching, heat left in place for the following stretches: Hamstring stretch x 10 holding 5-10 seconds (to patient tolerance) using stretch strap bilaterally IT band stretch x 10 holding 5-10 seconds (to patient tolerance) using stretch strap bilaterally Single KTC using strap x 10 holding 5-10 sec each bilaterally Modified piriformis stretch with strap x 5  each LE holding 5-10 sec each  Hook lying trunk rotation x  20  05/25/2023 Nustep level 3 x 8 min with PT present to discuss status Hip Matrix 25#:  hip abduction and hip extension. 2 x 10 bilat Leg press 70 lbs 2 x 20 (needs assist to get feet in place on foot plate) Standing matrix lat pull down 2 x 10 with 40 lbs Standing matrix tricep press 20 lbs 2 x 10 Standing matrix row 15 lbs 2 x 10 Seated clam with red loop x 20 Seated hamstring curls with red tband x 20 Seated bilateral shoulder ER with red tband x 20 Seated bilateral shoulder horizontal abduction 2 x 10 Seated bilateral bicep curls 2 x 10 with red tband Sit to/from stand  2 x 10 (second set did 2 sets of 5)  05/19/2023 Nustep level 3 x 8 min with PT present to discuss status Re cert assessment Hip Matrix 25#:  hip abduction and hip extension.  X15 bilat Seated hamstring stretch 2 x 30 sec bilat Seated clam with red loop x 20 Sit to/from stand holding 5# kettlebell 2 x 10 (second set x 5)  PATIENT EDUCATION:  Education details: PT eval findings, anticipated POC, initial HEP, and role of DN  Person educated: Patient Education method: Explanation, Demonstration, and Handouts Education comprehension: verbalized understanding and returned demonstration  HOME EXERCISE PROGRAM: Access Code: [email or web address] URL: [email or web address] Date: 03/25/2023 Prepared by: Raynelle Fanning  Exercises - Seated Cervical Rotation AROM  - 1 x daily - 7 x weekly - 1 sets - 10 reps - 10 hold - Seated Cervical Sidebending AROM  - 1 x daily - 7 x weekly - 1 sets - 10 reps - 5 hold - Seated Cervical Extension AROM  - 1 x daily - 7 x weekly - 1 sets - 10 reps - 10 hold - Seated Scapular Retraction  - 1 x daily - 7 x weekly - 1-3 sets - 10 reps - 2-3 sec hold - Sidelying Thoracic Rotation with Open Book  - 1 x daily - 7 x weekly - 2 sets - 5 reps - Seated Thoracic Extension Arms Overhead  - 1 x daily - 7 x weekly - 1-2 sets - 10 reps - 2-3 sec hold - Seated Trunk Rotation  - 1 x daily - 7 x weekly - 1-2  sets - 10 reps - 2-3 sec hold - Seated Diagonal Chops with Medicine Ball  - 1 x daily - 7 x weekly - 1-2 sets - 10 reps - 2-3 hold  ASSESSMENT:  CLINICAL IMPRESSION: Mr Pettis is tolerating slow progression of exercises with ease.  He continues to have hip pain that impedes him on start up.    He would benefit from continuing skilled PT for generalized strengthening and flexibility to restore patient to PLOF.    OBJECTIVE IMPAIRMENTS: decreased activity tolerance, decreased ROM, decreased strength, increased muscle spasms, impaired flexibility, impaired UE functional use, postural dysfunction, and pain.   ACTIVITY LIMITATIONS: lifting, sleeping, bathing, dressing, and reach over head  PARTICIPATION LIMITATIONS: driving and golf  PERSONAL FACTORS: Fitness, Past/current experiences, and 3+ comorbidities: multiple myeloma, previous fractures  are also affecting patient's functional outcome.   REHAB POTENTIAL: Good  CLINICAL DECISION MAKING: Evolving/moderate complexity  EVALUATION COMPLEXITY: Moderate   GOALS: Goals reviewed with patient? Yes  SHORT TERM GOALS: Target date: 04/22/2023   Patient will be independent with initial HEP.  Baseline:  Goal status: MET 05/03/23 2.  Improved mobility in thoracic  spine by 50% with exercises.  Baseline:  Goal status: MET 05/03/23   LONG TERM GOALS: Target date: 05/20/2023   Patient will be independent with advanced/ongoing HEP to improve outcomes and carryover.  Baseline:  Goal status: In progress  2.  Patient will report 50% improvement in neck and upper back pain to improve QOL.  Baseline:  Goal status: MET 05/19/23  3.  Patient will demonstrate improved cervical extension to 10 deg Baseline:  Goal status:MET 05/19/23  4.  Patient will demonstrate improved upper back strength as evidenced by improved sitting and standing posture. Baseline:  Goal status:In Progress  5.  Patient will report 7/50 on NDI to demonstrate improved  functional ability.  Baseline:  Goal status: Ongoing (see above)   PLAN:  PT FREQUENCY: 2x/week  PT DURATION: 8 weeks  PLANNED INTERVENTIONS: 97164- PT Re-evaluation, 97110-Therapeutic exercises, 97530- Therapeutic activity, [id number]- Neuromuscular re-education, 97535- Self Care, [id number]- Manual therapy, [id number]- Aquatic Therapy, 97014- Electrical stimulation (unattended), Patient/Family education, Taping, Dry Needling, Cryotherapy, and Moist heat  PLAN FOR NEXT SESSION: Continue 1-2 times per week x 4 weeks.  No mobilizations due to previous fractures, monitor right hip status, work on thoracic and cervical mobility,  postural strength, hip mobility.     Victorino Dike B. Atalya Dano, PT 06/02/23 5:02 PM [facility] [address] [place], [place] [address] Phone # [phone] Fax [phone]

## 2023-06-06 ENCOUNTER — Other Ambulatory Visit: Payer: Self-pay | Admitting: Physician Assistant

## 2023-06-06 DIAGNOSIS — C9000 Multiple myeloma not having achieved remission: Secondary | ICD-10-CM

## 2023-06-07 ENCOUNTER — Ambulatory Visit: Payer: Medicare Other | Attending: Hematology and Oncology

## 2023-06-07 DIAGNOSIS — M25552 Pain in left hip: Secondary | ICD-10-CM | POA: Diagnosis not present

## 2023-06-07 DIAGNOSIS — C9000 Multiple myeloma not having achieved remission: Secondary | ICD-10-CM | POA: Diagnosis not present

## 2023-06-07 DIAGNOSIS — S22000A Wedge compression fracture of unspecified thoracic vertebra, initial encounter for closed fracture: Secondary | ICD-10-CM | POA: Insufficient documentation

## 2023-06-07 DIAGNOSIS — M546 Pain in thoracic spine: Secondary | ICD-10-CM | POA: Insufficient documentation

## 2023-06-07 DIAGNOSIS — M542 Cervicalgia: Secondary | ICD-10-CM | POA: Insufficient documentation

## 2023-06-07 DIAGNOSIS — R293 Abnormal posture: Secondary | ICD-10-CM | POA: Insufficient documentation

## 2023-06-07 DIAGNOSIS — M6281 Muscle weakness (generalized): Secondary | ICD-10-CM

## 2023-06-07 DIAGNOSIS — R252 Cramp and spasm: Secondary | ICD-10-CM

## 2023-06-07 DIAGNOSIS — M25551 Pain in right hip: Secondary | ICD-10-CM | POA: Diagnosis not present

## 2023-06-07 DIAGNOSIS — R262 Difficulty in walking, not elsewhere classified: Secondary | ICD-10-CM

## 2023-06-07 NOTE — Therapy (Signed)
 OUTPATIENT PHYSICAL THERAPY  BALANCE ASSESSMENT  Patient Name: Travis [name] MRN: [id number] DOB:04/10/1951, 73 y.o., male Today's Date: 06/08/2023  END OF SESSION:  PT End of Session - 06/07/23 1238     Visit Number 13    Date for PT Re-Evaluation 06/17/23    Authorization Type [employer] MCR    Progress Note Due on Visit 20    PT Start Time 1230    PT Stop Time 1314    PT Time Calculation (min) 44 min    Activity Tolerance Patient limited by fatigue    Behavior During Therapy [facility] for tasks assessed/performed              Past Medical History:  Diagnosis Date   A-fib (HCC)    Arthritis    comes and goes; mostly in hands, occasionally elbow (04/05/2017)   Complication of anesthesia    just wanted to sleep alot  for couple days S/P VARICOCELE OR (04/05/2017)   DVT (deep venous thrombosis) (HCC)    LLE   Dysrhythmia    PVCs    GERD (gastroesophageal reflux disease)    History of hiatal hernia    History of radiation therapy    Thoracic Spine- 07/13/22-07/23/22-Dr. Lynwood Nasuti   Hypertension    Impaired glucose tolerance    Multiple myeloma (HCC)    Obesity (BMI 30-39.9) 07/26/2016   OSA on CPAP 07/26/2016   Mild with AHI 12.5/hr now on CPAP at 14cm H2O   PAF (paroxysmal atrial fibrillation) (HCC)    s/p PVI Ablation in 2018 // Flecainide  Rx // Echo 8/21: EF 60-65, no RWMA, mild LVH, normal RV SF, moderate RVE, mild BAE, trivial MR, mild dilation of aortic root (40 mm)   Pneumonia    may have had walking pneumonia a few years ago (04/05/2017)   Pre-diabetes    Thrombophlebitis    Past Surgical History:  Procedure Laterality Date   ATRIAL FIBRILLATION ABLATION  04/05/2017   ATRIAL FIBRILLATION ABLATION N/A 04/05/2017   Procedure: ATRIAL FIBRILLATION ABLATION;  Surgeon: Kelsie Lynwood, MD;  Location: [facility];  Service: Cardiovascular;  Laterality: N/A;   BONE BIOPSY Right 12/17/2021   Procedure: BONE BIOPSY;  Surgeon: Cristy Bonner DASEN, MD;  Location: [facility]  [place];  Service: Orthopedics;  Laterality: Right;   COMPLETE RIGHT HIP REPLACMENT  01/19/2019   EVALUATION UNDER ANESTHESIA WITH HEMORRHOIDECTOMY N/A 02/24/2017   Procedure: EXAM UNDER ANESTHESIA WITH  HEMORRHOIDECTOMY;  Surgeon: Sheldon Standing, MD;  Location: [facility];  Service: General;  Laterality: N/A;   EXCISIONAL TOTAL HIP ARTHROPLASTY WITH ANTIBIOTIC SPACERS Right 09/25/2020   Procedure: EXCISIONAL TOTAL HIP ARTHROPLASTY WITH ANTIBIOTIC SPACERS;  Surgeon: Ernie Cough, MD;  Location: [facility];  Service: Orthopedics;  Laterality: Right;   FLEXIBLE SIGMOIDOSCOPY N/A 04/15/2015   Procedure:  UNSEDATED FLEXIBLE SIGMOIDOSCOPY;  Surgeon: Gladis MARLA Louder, MD;  Location: [facility];  Service: Endoscopy;  Laterality: N/A;   HUMERUS IM NAIL Right 12/17/2021   Procedure: INTRAMEDULLARY (IM) NAIL HUMERAL;  Surgeon: Cristy Bonner DASEN, MD;  Location:  [facility];  Service: Orthopedics;  Laterality: Right;   KNEE ARTHROSCOPY Left ~ 2016   POSTERIOR CERVICAL FUSION/FORAMINOTOMY N/A 06/04/2022   Procedure: Thoracic one - Thoracic Four Posterior lateral arthrodesis/ fusion and Thoracic two Corpectomy;  Surgeon: Gillie Duncans, MD;  Location: [facility];  Service: Neurosurgery;  Laterality: N/A;   REIMPLANTATION OF TOTAL HIP Right 12/25/2020   Procedure: REIMPLANTATION/REVISION OF RIGHT TOTAL HIP VERSUS REPEAT IRRIGATION AND DEBRIDEMENT;  Surgeon: Ernie Cough, MD;  Location: [facility];  Service: Orthopedics;  Laterality: Right;   TESTICLE SURGERY  1988   VARICOCELE   TONSILLECTOMY     VARICOSE VEIN SURGERY Bilateral    Patient Active Problem List   Diagnosis Date Noted   Pancreatic cyst 09/30/2022   Infection due to novel influenza A virus 09/29/2022   Persistent atrial fibrillation (HCC) 07/15/2022   Acute on chronic diastolic heart failure (HCC) 07/03/2022   Pulmonary infiltrates 07/01/2022   Acute respiratory failure with hypoxia (HCC) 07/01/2022   Hyponatremia 06/29/2022   History of  thoracic surgery 06/29/2022   Acute DVT of left tibial vein (HCC) 06/21/2022   Hypokalemia 06/21/2022   Vitamin D  deficiency 05/30/2022   Other constipation 05/29/2022   Pathologic compression fracture of spine, initial encounter (HCC) 05/29/2022   Cord compression (HCC) 05/28/2022   Degenerative cervical disc 05/14/2022   Hypercoagulable state due to persistent atrial fibrillation (HCC) 01/11/2022   Multiple myeloma (HCC) 12/10/2021   Medication monitoring encounter 02/27/2021   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   Status post peripherally inserted central catheter (PICC) central line placement 11/06/2020   Drug rash with eosinophilia and systemic symptoms syndrome 11/06/2020   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   PAF (paroxysmal atrial fibrillation) (HCC)    GERD (gastroesophageal reflux disease) 03/29/2018   Hiatal hernia 03/29/2018   History of DVT (deep vein thrombosis) 03/29/2018   CAP (community acquired pneumonia) 03/29/2018   Osteoarthritis 03/29/2018   Neuropathy 08/29/2017   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   Paroxysmal atrial fibrillation with rapid ventricular response (HCC) 04/05/2017   Prolapsed internal hemorrhoids, grade 4, s/p ligation/pexy/hemorrhoidectomy x 2 02/24/2017 02/24/2017   OSA (obstructive sleep apnea) 07/26/2016   Obesity (BMI 30-39.9) 07/26/2016   Atrial fibrillation with RVR (HCC)    Essential hypertension    Chest pain at rest     PCP: Charlott Dorn LABOR, MD   REFERRING PROVIDER: Federico Norleen ONEIDA MADISON, MD   REFERRING DIAG: C90.00 (ICD-10-CM) - Multiple myeloma not having achieved remission (HCC)   THERAPY DIAG:  Pain in left hip  Difficulty in walking, not elsewhere classified  Muscle weakness (generalized)  Cramp and spasm  Multiple myeloma not having achieved remission [facility])  Rationale for Evaluation and Treatment: Rehabilitation  ONSET DATE: Feb 2024   SUBJECTIVE:   SUBJECTIVE  STATEMENT: Patient reports he saw MD about his left hip and received a cortisone shot for bursitis.   Xrays show good spacing and no degenerative hip issues.    PERTINENT HISTORY: significant for IgA Kappa Multiple Myeloma. He was diagnosed with smouldering myeloma in 2018 . July 2023 he developed right arm pain due to a pathologic fx and he underwent radiation and fixation. In early Feb 2024 he had radiation for pathologic T2 compression fx followed by a T1-T4 fusion.  PAIN:   06/07/23: Are you having pain? Yes: NPRS scale: 4/10 Pain location: R arm and upper traps; between shoulder blades, some low back  Pain description: constant tightness.soreness Aggravating factors: not sure Relieving factors: shoulder rolls, aquatic PT. Hot tub  PRECAUTIONS: Other: Multiple Myeloma with mets to R ribs, R humerus with ORIF for pathological fracture 12/17/21. Lesions have also been found in R rib and also in his skull. Pathologic fx T2 with fusion T1-4 on 06/04/22, Bilateral LE edema (on Lasix ), Afib   RED FLAGS: None   WEIGHT BEARING RESTRICTIONS: No  FALLS:  Has patient fallen in last 6 months? Yes.  Number of falls 1 walking to fast and tripped over feet  LIVING ENVIRONMENT: Lives with: lives with their family and lives with their spouse Lives in: House/apartment Stairs: Yes; Internal: 14 steps; on left going up and External: 3 steps; on right going up Has following equipment at home: Single point cane and Walker - 4 wheeled  OCCUPATION: semi-retired, attorney  PLOF: Independent with household mobility with device  PATIENT GOALS: stretch these muscles out  NEXT MD VISIT: 12/4  OBJECTIVE:  Note: Objective measures were completed at Evaluation unless otherwise noted.  DIAGNOSTIC FINDINGS: see chart  PATIENT SURVEYS:  Eval:  NDI 15 / 50 = 30.0 % 05/17/2023:  Neck Disability Index score: 10 / 50 = 20.0 %  COGNITION: Overall cognitive status: Within functional limits for tasks  assessed     SENSATION: Some tingling in his hands and feet   POSTURE: rounded shoulders, forward head, and increased thoracic kyphosis Sits in significant forward head, thoracic kyphosis  PALPATION: Marked tightness in B cervical paraspinals, levator, UT  CERVICAL ROM: * pain  Active ROM A/PROM (deg) eval A/PROM (deg) 05/19/23  Flexion full full  Extension 4 deg 15  Right lateral flexion 17 20  Left lateral flexion 13* 22  Right rotation 38 32  Left rotation 42 40   (Blank rows = not tested)   SHOULDER AROM (tested in sitting): Flex  R 102 deg, L 123 deg ABD R 88 deg, L 124 deg  Functional IR R thumb to L5, L to T10 Functional ER - can reach behind head easily L, must bend neck forward R  05/19/23: Flex  R 116 deg, L 141 deg ABD R 108 deg, L 139 deg  Functional IR R thumb to L5, L to T10 Functional ER -, to C4-5 R,  to C4-5 L  SHOULDER STRENGTH:  Flex R 4+/5, ABD R 4/5  else 5/5 B    TODAY'S TREATMENT:                                                                                                                              DATE:   06/07/2023 Nustep x 8 min level 3 Supine hamstring stretch 3 x 30 sec both Supine IT band stretch 3 x 30 sec both Supine heel slides with strap x 10 both Supine piriformis stretch x 10 both LTR x 20 both Hip IR/ER combo x 10 each side Sit to stand from mat table with balance pad x 5 Supine lower trunk rotation x 20 Supine hip rotation with assist x 1 min each side Supine march x 20 Supine piriformis stretch with strap 3 x 30 sec Supine hamstring stretch with strap  3 x 30 sec  06/02/2023 Nustep x 8 min level 3 Lat pull down 40 lbs 2 x 10 Tricep press 20 lbs 2 x 10 Leg Press 80 lbs 3 x 10 (seat 9) Sit to stand from mat table with balance pad x  5 Supine lying on moist heat pad: lower trunk rotation x 20 Supine hip rotation with assist x 1 min each side Supine march x 20 (All supine exercises lying on moist heat  pad)  05/27/2023 Nustep level 1 (decreased level due to elevated pain) x 8 min with PT present to discuss status Supine hook lying moist heat to right low back x 5 min prior to stretching, heat left in place for the following stretches: Hamstring stretch x 10 holding 5-10 seconds (to patient tolerance) using stretch strap bilaterally IT band stretch x 10 holding 5-10 seconds (to patient tolerance) using stretch strap bilaterally Single KTC using strap x 10 holding 5-10 sec each bilaterally Modified piriformis stretch with strap x 5 each LE holding 5-10 sec each  Hook lying trunk rotation x 20  )  PATIENT EDUCATION:  Education details: PT eval findings, anticipated POC, initial HEP, and role of DN  Person educated: Patient Education method: Explanation, Demonstration, and Handouts Education comprehension: verbalized understanding and returned demonstration  HOME EXERCISE PROGRAM: Access Code: [email or web address] URL: [email or web address] Date: 03/25/2023 Prepared by: Mliss  Exercises - Seated Cervical Rotation AROM  - 1 x daily - 7 x weekly - 1 sets - 10 reps - 10 hold - Seated Cervical Sidebending AROM  - 1 x daily - 7 x weekly - 1 sets - 10 reps - 5 hold - Seated Cervical Extension AROM  - 1 x daily - 7 x weekly - 1 sets - 10 reps - 10 hold - Seated Scapular Retraction  - 1 x daily - 7 x weekly - 1-3 sets - 10 reps - 2-3 sec hold - Sidelying Thoracic Rotation with Open Book  - 1 x daily - 7 x weekly - 2 sets - 5 reps - Seated Thoracic Extension Arms Overhead  - 1 x daily - 7 x weekly - 1-2 sets - 10 reps - 2-3 sec hold - Seated Trunk Rotation  - 1 x daily - 7 x weekly - 1-2 sets - 10 reps - 2-3 sec hold - Seated Diagonal Chops with Medicine Ball  - 1 x daily - 7 x weekly - 1-2 sets - 10 reps - 2-3 hold  ASSESSMENT:  CLINICAL IMPRESSION: Mr Naval had cortisone injection for left hip bursitis.  He is using walker today as he had the injection just before arriving to PT.  He  completed all tasks today with ease.  He had no pain with today's stretches and strengthening.  He reported feeling increased ease with walking post treatment.    He would benefit from continuing skilled PT for generalized strengthening and flexibility to restore patient to PLOF.    OBJECTIVE IMPAIRMENTS: decreased activity tolerance, decreased ROM, decreased strength, increased muscle spasms, impaired flexibility, impaired UE functional use, postural dysfunction, and pain.   ACTIVITY LIMITATIONS: lifting, sleeping, bathing, dressing, and reach over head  PARTICIPATION LIMITATIONS: driving and golf  PERSONAL FACTORS: Fitness, Past/current experiences, and 3+ comorbidities: multiple myeloma, previous fractures  are also affecting patient's functional outcome.   REHAB POTENTIAL: Good  CLINICAL DECISION MAKING: Evolving/moderate complexity  EVALUATION COMPLEXITY: Moderate   GOALS: Goals reviewed with patient? Yes  SHORT TERM GOALS: Target date: 04/22/2023   Patient will be independent with initial HEP.  Baseline:  Goal status: MET 05/03/23 2.  Improved mobility in thoracic spine by 50% with exercises.  Baseline:  Goal status: MET 05/03/23   LONG TERM GOALS: Target date: 05/20/2023   Patient will be independent with advanced/ongoing HEP  to improve outcomes and carryover.  Baseline:  Goal status: In progress  2.  Patient will report 50% improvement in neck and upper back pain to improve QOL.  Baseline:  Goal status: MET 05/19/23  3.  Patient will demonstrate improved cervical extension to 10 deg Baseline:  Goal status:MET 05/19/23  4.  Patient will demonstrate improved upper back strength as evidenced by improved sitting and standing posture. Baseline:  Goal status:In Progress  5.  Patient will report 7/50 on NDI to demonstrate improved functional ability.  Baseline:  Goal status: Ongoing (see above)   PLAN:  PT FREQUENCY: 2x/week  PT DURATION: 8 weeks  PLANNED  INTERVENTIONS: 97164- PT Re-evaluation, 97110-Therapeutic exercises, 97530- Therapeutic activity, [id number]- Neuromuscular re-education, 97535- Self Care, [id number]- Manual therapy, [id number]- Aquatic Therapy, 97014- Electrical stimulation (unattended), Patient/Family education, Taping, Dry Needling, Cryotherapy, and Moist heat  PLAN FOR NEXT SESSION: Patient will continue until he starts inpatient treatment for his cancer.    No mobilizations due to previous fractures, monitor right hip status, work on thoracic and cervical mobility,  postural strength, hip mobility.     Delon B. Shamon Lobo, PT 06/08/23 8:43 AM [facility] [address], [address] [place], [place] [address] Phone # [phone] Fax [phone]

## 2023-06-08 ENCOUNTER — Inpatient Hospital Stay: Payer: Medicare Other | Attending: Hematology and Oncology

## 2023-06-08 ENCOUNTER — Inpatient Hospital Stay: Payer: Medicare Other | Admitting: Physician Assistant

## 2023-06-08 VITALS — BP 125/56 | HR 51 | Temp 97.9°F | Resp 16 | Ht 70.0 in | Wt 268.0 lb

## 2023-06-08 DIAGNOSIS — R1310 Dysphagia, unspecified: Secondary | ICD-10-CM | POA: Diagnosis not present

## 2023-06-08 DIAGNOSIS — R140 Abdominal distension (gaseous): Secondary | ICD-10-CM | POA: Diagnosis not present

## 2023-06-08 DIAGNOSIS — Z818 Family history of other mental and behavioral disorders: Secondary | ICD-10-CM | POA: Diagnosis not present

## 2023-06-08 DIAGNOSIS — K5900 Constipation, unspecified: Secondary | ICD-10-CM | POA: Insufficient documentation

## 2023-06-08 DIAGNOSIS — Z9089 Acquired absence of other organs: Secondary | ICD-10-CM | POA: Insufficient documentation

## 2023-06-08 DIAGNOSIS — Z823 Family history of stroke: Secondary | ICD-10-CM | POA: Insufficient documentation

## 2023-06-08 DIAGNOSIS — Z7901 Long term (current) use of anticoagulants: Secondary | ICD-10-CM | POA: Insufficient documentation

## 2023-06-08 DIAGNOSIS — Z79899 Other long term (current) drug therapy: Secondary | ICD-10-CM | POA: Diagnosis not present

## 2023-06-08 DIAGNOSIS — I10 Essential (primary) hypertension: Secondary | ICD-10-CM | POA: Insufficient documentation

## 2023-06-08 DIAGNOSIS — I480 Paroxysmal atrial fibrillation: Secondary | ICD-10-CM | POA: Diagnosis not present

## 2023-06-08 DIAGNOSIS — E876 Hypokalemia: Secondary | ICD-10-CM | POA: Diagnosis not present

## 2023-06-08 DIAGNOSIS — C9000 Multiple myeloma not having achieved remission: Secondary | ICD-10-CM | POA: Diagnosis not present

## 2023-06-08 DIAGNOSIS — Z881 Allergy status to other antibiotic agents status: Secondary | ICD-10-CM | POA: Diagnosis not present

## 2023-06-08 DIAGNOSIS — R600 Localized edema: Secondary | ICD-10-CM | POA: Diagnosis not present

## 2023-06-08 DIAGNOSIS — Z7952 Long term (current) use of systemic steroids: Secondary | ICD-10-CM | POA: Insufficient documentation

## 2023-06-08 DIAGNOSIS — Z8249 Family history of ischemic heart disease and other diseases of the circulatory system: Secondary | ICD-10-CM | POA: Insufficient documentation

## 2023-06-08 DIAGNOSIS — M5126 Other intervertebral disc displacement, lumbar region: Secondary | ICD-10-CM | POA: Diagnosis not present

## 2023-06-08 DIAGNOSIS — G4733 Obstructive sleep apnea (adult) (pediatric): Secondary | ICD-10-CM | POA: Insufficient documentation

## 2023-06-08 DIAGNOSIS — M48061 Spinal stenosis, lumbar region without neurogenic claudication: Secondary | ICD-10-CM | POA: Insufficient documentation

## 2023-06-08 DIAGNOSIS — Z7961 Long term (current) use of immunomodulator: Secondary | ICD-10-CM | POA: Insufficient documentation

## 2023-06-08 DIAGNOSIS — M533 Sacrococcygeal disorders, not elsewhere classified: Secondary | ICD-10-CM | POA: Insufficient documentation

## 2023-06-08 DIAGNOSIS — Z86718 Personal history of other venous thrombosis and embolism: Secondary | ICD-10-CM | POA: Insufficient documentation

## 2023-06-08 DIAGNOSIS — Z79624 Long term (current) use of inhibitors of nucleotide synthesis: Secondary | ICD-10-CM | POA: Insufficient documentation

## 2023-06-08 LAB — CBC WITH DIFFERENTIAL (CANCER CENTER ONLY)
Abs Immature Granulocytes: 0.03 10*3/uL (ref 0.00–0.07)
Basophils Absolute: 0 10*3/uL (ref 0.0–0.1)
Basophils Relative: 0 %
Eosinophils Absolute: 0 10*3/uL (ref 0.0–0.5)
Eosinophils Relative: 0 %
HCT: 31.1 % — ABNORMAL LOW (ref 39.0–52.0)
Hemoglobin: 10.3 g/dL — ABNORMAL LOW (ref 13.0–17.0)
Immature Granulocytes: 1 %
Lymphocytes Relative: 10 %
Lymphs Abs: 0.6 10*3/uL — ABNORMAL LOW (ref 0.7–4.0)
MCH: 33.1 pg (ref 26.0–34.0)
MCHC: 33.1 g/dL (ref 30.0–36.0)
MCV: 100 fL (ref 80.0–100.0)
Monocytes Absolute: 1.1 10*3/uL — ABNORMAL HIGH (ref 0.1–1.0)
Monocytes Relative: 16 %
Neutro Abs: 4.7 10*3/uL (ref 1.7–7.7)
Neutrophils Relative %: 73 %
Platelet Count: 258 10*3/uL (ref 150–400)
RBC: 3.11 MIL/uL — ABNORMAL LOW (ref 4.22–5.81)
RDW: 13.6 % (ref 11.5–15.5)
WBC Count: 6.5 10*3/uL (ref 4.0–10.5)
nRBC: 0.3 % — ABNORMAL HIGH (ref 0.0–0.2)

## 2023-06-08 LAB — CMP (CANCER CENTER ONLY)
ALT: 15 U/L (ref 0–44)
AST: 11 U/L — ABNORMAL LOW (ref 15–41)
Albumin: 3.8 g/dL (ref 3.5–5.0)
Alkaline Phosphatase: 61 U/L (ref 38–126)
Anion gap: 5 (ref 5–15)
BUN: 34 mg/dL — ABNORMAL HIGH (ref 8–23)
CO2: 26 mmol/L (ref 22–32)
Calcium: 8.9 mg/dL (ref 8.9–10.3)
Chloride: 104 mmol/L (ref 98–111)
Creatinine: 1.1 mg/dL (ref 0.61–1.24)
GFR, Estimated: >60 mL/min (ref >60)
Glucose, Bld: 116 mg/dL — ABNORMAL HIGH (ref 70–99)
Potassium: 4.6 mmol/L (ref 3.5–5.1)
Sodium: 135 mmol/L (ref 135–145)
Total Bilirubin: 0.4 mg/dL (ref 0.0–1.2)
Total Protein: 5.9 g/dL — ABNORMAL LOW (ref 6.5–8.1)

## 2023-06-08 NOTE — Progress Notes (Signed)
 [facility] Telephone:([phone]) [phone]   [phone]) [phone]  PROGRESS NOTE  Patient Care Team: Charlott Dorn LABOR, MD as PCP - General (Internal Medicine) Okey Vina GAILS, MD as PCP - Cardiology (Cardiology) Shlomo Wilbert SAUNDERS, MD as PCP - Sleep Medicine (Cardiology) Cindie Ole DASEN, MD as PCP - Electrophysiology (Cardiology) Sheldon Standing, MD as Consulting Physician (General Surgery) Rosalie Kitchens, MD as Consulting Physician (Gastroenterology) Shlomo Wilbert SAUNDERS, MD as Consulting Physician (Sleep Medicine) Tobie Tonita POUR, DO as Consulting Physician (Neurology)  Hematological/Oncological History # IgA Kappa Multiple Myeloma 05/05/2020: Bone marrow biopsy showed increased number of plasma cells representing 14% of all cells in the aspirate associated with numerous variably sized clusters in the clot and biopsy sections 04/10/2021: M protein 0.3, IgA kappa specificity. Kappa 580.5, Labda 7.0, ratio 82.93.   12/07/2021: Labs show of 1971.3, lambda 5.5, ratio 358.42 12/10/2021: Transition care to Dr. Federico. 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  01/26/2022: Cycle 2 Day 1 of Dara/Rev/Dex 02/22/2022: Cycle 3 Day 1 of Dara/Rev/Dex 03/23/2022: Cycle 4 Day 1 of Dara/Rev/Dex 04/20/2022: Cycle 5 Day 1 of Dara/Rev/Dex 05/18/2022: Cycle 6 Day 1 of Dara/Rev/Dex 05/27/2022: MRI cervical/thoracic/lumbar: pathologic fracture at T2 with significant osseous retropulsion and resulting cord compression. Additional smaller lesions within T1 vertebral body, right C7 and T3 articulating facets.  Small Multiple Myeloma lesions scattered at all lumbar levels and in the visible left sacral ala. Largest lumbar tumor is 2.3 cm in the left L1 posterior elements and pedicle. No lumbar extraosseous or epidural tumor extension, or pathologic fracture.Underlying chronic lumbar spine  degeneration, with a new central disc herniation at L3-L4 since 2021 now resulting in mild to moderate degenerative spinal stenosis there. 05/28/2022-06/09/2022: Admitted for T2 pathologic fracture and cord compression. He received urgent radiation therapy for a total of 3.0 Gy. He underwent  thoracic 1 through 4 posterior lateral arthrodesis/fusion and 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  06/23/2022: Cycle 1 Day 1 of Velcade /Pomalyst /Dex 08/04/2022: Cycle 2 Day 1 of Velcade /Pomalyst /Dex. Pomalyst  to be decreased to 2mg  PO daily due to cytopenias.  09/01/2022: Cycle 3 Day 1 of Velcade /Pomalyst /Dex. (Missed 2nd dose of cycle due to hospitalization)  10/06/2022: Cycle 4 Day 1 of Velcade /Pomalyst /Dex.  10/27/2022: Cycle 5 Day 1 of Velcade /Pomalyst /Dex.  12/02/2022: Cycle 6 Day 1 of Velcade /Pomalyst /Dex. 01/12/2023: Cycle 1 Day 1 of Kyprolis /Dex 02/09/2023: Cycle 2 Day 1 of Kyprolis /Dex   04/07/2023: Transitioned to selinexor  and Pomalyst  per request of transplant team due to progression on Kyprolis  and dexamethasone   Interval History:  Travis [name] 73 y.o. male with medical history significant for IgA Kappa Multiple Myeloma who presents for a follow up visit. He was last seen on 05/25/2023. In the interim, he received his last dose of Selinoxor this past Monday before starting his 2 week washout for his CAR-T treatment scheduled for 06/20/23.  On exam today [name] reports he is overall stable with his energy and appetite. He does have ongoing fatigue and continues to stay active. He continues with physical therapy which improves his overall strength. He denies nausea, vomiting. He has episodes of constipation that is managed with stool softeners and laxatives. He denies any signs of bleeding. He does have some facial edema and abdominal distention after receiving steroids. Otherwise he has been well and is willing and able to continue with treatment at this time.  He denies any  infectious  symptoms such as runny nose, sore throat, or cough.  He denies fevers, chills, sweats, shortness of breath, chest pain or cough. He has no other complaints. Rest of the 10 point ROS is below.     MEDICAL HISTORY:  Past Medical History:  Diagnosis Date   A-fib [facility])    Arthritis    comes and goes; mostly in hands, occasionally elbow (04/05/2017)   Complication of anesthesia    just wanted to sleep alot  for couple days S/P VARICOCELE OR (04/05/2017)   DVT (deep venous thrombosis) (HCC)    LLE   Dysrhythmia    PVCs    GERD (gastroesophageal reflux disease)    History of hiatal hernia    History of radiation therapy    Thoracic Spine- 07/13/22-07/23/22-Dr. Lynwood Nasuti   Hypertension    Impaired glucose tolerance    Multiple myeloma (HCC)    Obesity (BMI 30-39.9) 07/26/2016   OSA on CPAP 07/26/2016   Mild with AHI 12.5/hr now on CPAP at 14cm H2O   PAF (paroxysmal atrial fibrillation) (HCC)    s/p PVI Ablation in 2018 // Flecainide  Rx // Echo 8/21: EF 60-65, no RWMA, mild LVH, normal RV SF, moderate RVE, mild BAE, trivial MR, mild dilation of aortic root (40 mm)   Pneumonia    may have had walking pneumonia a few years ago (04/05/2017)   Pre-diabetes    Thrombophlebitis     SURGICAL HISTORY: Past Surgical History:  Procedure Laterality Date   ATRIAL FIBRILLATION ABLATION  04/05/2017   ATRIAL FIBRILLATION ABLATION N/A 04/05/2017   Procedure: ATRIAL FIBRILLATION ABLATION;  Surgeon: Kelsie Lynwood, MD;  Location: [facility];  Service: Cardiovascular;  Laterality: N/A;   BONE BIOPSY Right 12/17/2021   Procedure: BONE BIOPSY;  Surgeon: Cristy Bonner DASEN, MD;  Location: [facility];  Service: Orthopedics;  Laterality: Right;   COMPLETE RIGHT HIP REPLACMENT  01/19/2019   EVALUATION UNDER ANESTHESIA WITH HEMORRHOIDECTOMY N/A 02/24/2017   Procedure: EXAM UNDER ANESTHESIA WITH  HEMORRHOIDECTOMY;  Surgeon: Sheldon Standing, MD;  Location: [facility];  Service:  General;  Laterality: N/A;   EXCISIONAL TOTAL HIP ARTHROPLASTY WITH ANTIBIOTIC SPACERS Right 09/25/2020   Procedure: EXCISIONAL TOTAL HIP ARTHROPLASTY WITH ANTIBIOTIC SPACERS;  Surgeon: Ernie Cough, MD;  Location: [facility];  Service: Orthopedics;  Laterality: Right;   FLEXIBLE SIGMOIDOSCOPY N/A 04/15/2015   Procedure:  UNSEDATED FLEXIBLE SIGMOIDOSCOPY;  Surgeon: Gladis MARLA Louder, MD;  Location: [facility];  Service: Endoscopy;  Laterality: N/A;   HUMERUS IM NAIL Right 12/17/2021   Procedure: INTRAMEDULLARY (IM) NAIL HUMERAL;  Surgeon: Cristy Bonner DASEN, MD;  Location: [facility];  Service: Orthopedics;  Laterality: Right;   KNEE ARTHROSCOPY Left ~ 2016   POSTERIOR CERVICAL FUSION/FORAMINOTOMY N/A 06/04/2022   Procedure: Thoracic one - Thoracic Four Posterior lateral arthrodesis/ fusion and Thoracic two Corpectomy;  Surgeon: Gillie Duncans, MD;  Location: [facility];  Service: Neurosurgery;  Laterality: N/A;   REIMPLANTATION OF TOTAL HIP Right 12/25/2020   Procedure: REIMPLANTATION/REVISION OF RIGHT TOTAL HIP VERSUS REPEAT IRRIGATION AND DEBRIDEMENT;  Surgeon: Ernie Cough, MD;  Location: [facility];  Service: Orthopedics;  Laterality: Right;   TESTICLE SURGERY  1988   VARICOCELE   TONSILLECTOMY     VARICOSE VEIN SURGERY Bilateral     SOCIAL HISTORY: Social History   Socioeconomic History   Marital status: Married    Spouse name: Not on file   Number of children: 1   Years of education: social worker school  Highest education level: Not on file  Occupational History   Occupation: lawyer  Tobacco Use   Smoking status: Never   Smokeless tobacco: Never  Vaping Use   Vaping status: Never Used  Substance and Sexual Activity   Alcohol use: Yes    Alcohol/week: 2.0 standard drinks of alcohol    Types: 2 Glasses of wine per week    Comment: 2 glasses of wine a week, occ bourbon, beer   Drug use: No   Sexual activity: Not Currently  Other Topics Concern   Not on file  Social History Narrative    Lives in [place] with spouse in a 2 story home.  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 1 daughter.     Education: social worker school.    Social Drivers of Corporate Investment Banker Strain: Not on file  Food Insecurity: No Food Insecurity (09/27/2022)   Hunger Vital Sign    Worried About Running Out of Food in the Last Year: Never true    Ran Out of Food in the Last Year: Never true  Transportation Needs: No Transportation Needs (09/27/2022)   PRAPARE - Administrator, Civil Service (Medical): No    Lack of Transportation (Non-Medical): No  Physical Activity: Not on file  Stress: Not on file  Social Connections: Unknown (09/15/2021)   Received [facility]   Social Network    Social Network: Not on file  Intimate Partner Violence: Not At Risk (09/27/2022)   Humiliation, Afraid, Rape, and Kick questionnaire    Fear of Current or Ex-Partner: No    Emotionally Abused: No    Physically Abused: No    Sexually Abused: No    FAMILY HISTORY: Family History  Problem Relation Age of Onset   Dementia Mother    Stroke Father    Atrial fibrillation Father    Hypertension Father    Hypertension Paternal Grandfather    Dementia Maternal Grandmother     ALLERGIES:  is allergic to octagam [immune globulin ], linezolid , and vancomycin .  MEDICATIONS:  Current Outpatient Medications  Medication Sig Dispense Refill   albuterol  (VENTOLIN  HFA) 108 (90 Base) MCG/ACT inhaler Inhale 2 puffs into the lungs every 6 (six) hours as needed for wheezing or shortness of breath. 8 g 0   amiodarone  (PACERONE ) 200 MG tablet Take 200 mg by mouth daily.     calcium  carbonate (TUMS - DOSED IN MG ELEMENTAL CALCIUM ) 500 MG chewable tablet Chew 1 tablet by mouth daily.     cholecalciferol  (VITAMIN D3) 25 MCG (1000 UNIT) tablet Take 1,000 Units by mouth daily.     Cyanocobalamin  (VITAMIN B-12 PO) Take 1,000 mcg by mouth daily.     dexamethasone  (DECADRON ) 4 MG  tablet Take 5 tablets (20 mg total) by mouth every 7 (seven) days. Take 5 tabs (20 mg) by mouth once a day in the morning of chemo on Wednesdays- or unless otherwise instructed 20 tablet 3   ELIQUIS  5 MG TABS tablet TAKE ONE TABLET BY MOUTH TWICE A DAY 60 tablet 6   fluticasone  (FLONASE ) 50 MCG/ACT nasal spray Place 1 spray into both nostrils daily as needed for allergies or rhinitis.     gabapentin  (NEURONTIN ) 300 MG capsule TAKE TWO CAPSULES BY MOUTH TWICE A DAY 360 capsule 1   hydrALAZINE  (APRESOLINE ) 100 MG tablet Take 1 tablet (100 mg total) by mouth 3 (three) times daily. 270 tablet 3   ketoconazole  (NIZORAL ) 2 %  cream as needed for irritation.     metolazone  (ZAROXOLYN ) 2.5 MG tablet Take 1 tablet (2.5 mg total) by mouth every other day. (Patient taking differently: Take 2.5 mg by mouth 3 times/day as needed-between meals & bedtime (edema). Taking 1/2 ever other day or every three days)     polyethylene glycol powder (GLYCOLAX /MIRALAX ) 17 GM/SCOOP powder Take 1 g by mouth daily.     pomalidomide  (POMALYST ) 1 MG capsule Take 1 capsule (1 mg total) by mouth daily. [employer] Auth # [id number] Date Obtained 05/23/23 Take 1 capsule daily for 21 days then none for 7 days 21 capsule 0   potassium chloride  SA (KLOR-CON  M) 20 MEQ tablet TAKE TWO TABLETS BY MOUTH ONE TIME DAILY - TAKE AN EXTRA TABLET ON THE DAY YOU TAKE THE METOLAZONE  ( TOTAL) (Patient taking differently: Take 40 mEq by mouth 3 (three) times daily.) 225 tablet 3   psyllium (METAMUCIL) 58.6 % powder Take by mouth at bedtime. 3 tsp daily hs     sacubitril -valsartan  (ENTRESTO ) 49-51 MG Take 1 tablet by mouth 2 (two) times daily.     sodium chloride  (OCEAN) 0.65 % SOLN nasal spray Place 1 spray into both nostrils as needed for congestion.     spironolactone  (ALDACTONE ) 25 MG tablet Take 1 tablet (25 mg total) by mouth daily.     torsemide  (DEMADEX ) 20 MG tablet Take 2 tablets (40 mg total) by mouth daily. 90 tablet 3   traMADol  (ULTRAM ) 50  MG tablet Take 1 tablet (50 mg total) by mouth every 6 (six) hours as needed. 30 tablet 0   selinexor  (XPOVIO ) Therapy Pack (60 mg once weekly) Take 1 tablet (60 mg total) by mouth once a week. (Patient not taking: Reported on 06/08/2023) 4 tablet 0   No current facility-administered medications for this visit.    REVIEW OF SYSTEMS:   Constitutional: ( - ) fevers, ( - )  chills , ( - ) night sweats Eyes: ( - ) blurriness of vision, ( - ) double vision, ( - ) watery eyes Ears, nose, mouth, throat, and face: ( - ) mucositis, ( - ) sore throat Respiratory: ( - ) cough, ( - ) dyspnea, ( - ) wheezes Cardiovascular: ( - ) palpitation, ( - ) chest discomfort, ( - ) lower extremity swelling Gastrointestinal:  ( - ) nausea, ( - ) heartburn, ( - ) change in bowel habits Skin: ( - ) abnormal skin rashes Lymphatics: ( - ) new lymphadenopathy, ( - ) easy bruising Neurological: ( - ) numbness, ( - ) tingling, ( - ) new weaknesses Behavioral/Psych: ( - ) mood change, ( - ) new changes  All other systems were reviewed with the patient and are negative.  PHYSICAL EXAMINATION: ECOG PERFORMANCE STATUS: 1 - Symptomatic but completely ambulatory  Vitals:   06/08/23 1048 06/08/23 1049  BP: (!) 129/40 (!) 125/56  Pulse: (!) 52 (!) 51  Resp: 16   Temp: 97.9 F (36.6 C)   SpO2: 97%        Filed Weights   06/08/23 1048  Weight: 268 lb (121.6 kg)    GENERAL: Well-appearing elderly Caucasian male, alert, no distress and comfortable SKIN: no overt abnormalities of the skin.  EYES: conjunctiva are pink and non-injected, sclera clear LUNGS: clear to auscultation and percussion with normal breathing effort HEART: tachycardic &  no murmurs. +bilateral trace lower extremity edema Musculoskeletal: no cyanosis of digits and no clubbing  PSYCH: alert & oriented x 3, fluent  speech NEURO: no focal motor/sensory deficits  LABORATORY DATA:  I have reviewed the data as listed    Latest Ref Rng & Units  06/08/2023   10:14 AM 05/25/2023   10:34 AM 05/11/2023    9:33 AM  CBC  WBC 4.0 - 10.5 K/uL 6.5  7.4  7.2   Hemoglobin 13.0 - 17.0 g/dL 89.6  88.5  89.2   Hematocrit 39.0 - 52.0 % 31.1  35.2  32.8   Platelets 150 - 400 K/uL 258  148  187        Latest Ref Rng & Units 06/08/2023   10:14 AM 05/25/2023   10:34 AM 05/11/2023    9:33 AM  CMP  Glucose 70 - 99 mg/dL 883  889  894   BUN 8 - 23 mg/dL 34  41  29   Creatinine 0.61 - 1.24 mg/dL 8.89  8.81  8.94   Sodium 135 - 145 mmol/L 135  137  137   Potassium 3.5 - 5.1 mmol/L 4.6  3.8  4.0   Chloride 98 - 111 mmol/L 104  103  104   CO2 22 - 32 mmol/L 26  27  27    Calcium  8.9 - 10.3 mg/dL 8.9  9.3  8.9   Total Protein 6.5 - 8.1 g/dL 5.9  6.1  5.9   Total Bilirubin 0.0 - 1.2 mg/dL 0.4  0.5  0.5   Alkaline Phos 38 - 126 U/L 61  71  63   AST 15 - 41 U/L 11  14  15    ALT 0 - 44 U/L 15  17  23      Lab Results  Component Value Date   MPROTEIN Not Observed 05/11/2023   MPROTEIN Not Observed 04/20/2023   MPROTEIN Not Observed 04/07/2023   Lab Results  Component Value Date   KPAFRELGTCHN 212.7 (H) 05/11/2023   KPAFRELGTCHN 390.9 (H) 04/20/2023   KPAFRELGTCHN 523.0 (H) 04/07/2023   LAMBDASER 4.4 (L) 05/11/2023   LAMBDASER 8.3 04/20/2023   LAMBDASER 2.7 (L) 04/07/2023   KAPLAMBRATIO 48.34 (H) 05/11/2023   KAPLAMBRATIO 47.10 (H) 04/20/2023   KAPLAMBRATIO 193.70 (H) 04/07/2023     RADIOGRAPHIC STUDIES: DG Ribs Unilateral W/Chest Right Result Date: 05/25/2023 CLINICAL DATA:  Multiple myeloma.  Rib pain. EXAM: RIGHT RIBS AND CHEST - 3+ VIEW COMPARISON:  Radiograph dated 10/03/2022. FINDINGS: There is mild cardiomegaly with mild vascular congestion. No focal consolidation, pleural effusion, or pneumothorax. No acute osseous pathology. No displaced rib fracture. There is focal area of thickening of the posterior right sixth rib old healed fracture of the lateral right ninth rib. Right humeral ORIF. IMPRESSION: 1. No acute cardiopulmonary process. 2.  No displaced rib fracture. Electronically Signed   By: Vanetta Chou M.D.   On: 05/25/2023 17:10     ASSESSMENT & PLAN Travis [name] is a 73 y.o. male with medical history significant for IgA Kappa Multiple Myeloma who presents for a follow up visit.   # IgA Kappa Multiple Myeloma --Metastatic survey completed 12/15/2021 --Patient underwent fixation of his humerus on 12/17/2021, pathology confirmed plasmacytoma/plasma cell neoplasm.  --Received 6 cycles of Darzalex , Revlimid , and dexamethasone  started on 12/28/2021-05/18/2022.  --Due to worsening myeloma osseous lesions including pathologic fracture at T2 with cord compression, Dr. Federico recommends changing therapy to Velcade /Pomalyst /Dex.  --Due to rising kappa free light chains, recommend to change therapy to Carfilzomib /Dex started on 01/12/2023. --Due to rising kappa free light chains,  made referral to [facility] for evaluation of CAR-T. Transitioned  to selinexor  and Pomalyst  on 04/07/2023 per request of transplant team. Plan: --Last dose of Selinexor /Pom/Dex this past Monday. Currently in washout period before scheduled CAR-T on 06/20/2023 at [facility].   --labs from today showed white blood cell 6.5, hemoglobin 10.3, MCV 100.0, platelets 258. Creatinine and LFTs in range. Myeloma panel pending. --Last myeloma labs from 05/11/2023 show kappa light chain improving to 212.7 (previously 390.9). M protein not observed.  --Will await follow up recommendations from [facility] after CAR-T treatment  #Right Sided Rib Pain -- Patient has right sided rib pain which has been worsening over the last several weeks. -- Xray from 05/25/23 showed no acute process including rib fractures.   # Neck pain and mid back pain: --MRI C/T/L spine obtinaed that showed unchanged lytic lesions in the right C7 inferior articular process,T6, T8, and T10  vertebral bodies, as well  as the T11 spinous process. Unchanged  multilevel cervical spondylsis, Interval enlargement of disc  herniation at L3-L4 with progressive spinal canal stenosis. No new or progressive lytic lesions.  --Encouraged to continue with physical therapy and follow up with ortho.   #Palliative Radiation Therapy: # Dysphagia --Received palliative radiation from 01/06/2022-01/19/2022 to right hymerus, right forearm and right chest/rib, 20 Gy in 10 Fx.  --recieved palliative radiation to surgical bed in the upper thoracic spine area on 07/13/2022.  27 Gy in 9 Fx.   #Lower extremity edema--improved: --Currently on lasix  and torsemide   # Rash Concerning for Shingles-resolved -- Prescribed valacyclovir  3 times daily 1000 mg x 10 days-completed the course -- We will continue to monitor rash closely.  #DVT: --Found to have acute DVT involving left peroneal veins. Likely secondary to recent surgery and hospitalization --Currently on eliquis  5 mg BID.    #Hypokalemia: --Potassium level is 4.6 today. Advised to continue with 40 mEq twice daily and increase to 60 mEq on the days he takes torsemide /spironolactone   #Rash on scalp and right forearm: --Recommend topical hydrocortisone cream for spot treatment  #Supportive Care -- chemotherapy education complete -- port placement not required -- zofran  8mg  q8H PRN and compazine  10mg  PO q6H for nausea -- acyclovir  400mg  PO BID for VCZ prophylaxis -- Started Zometa  q 12 weeks on 11/10/2022. Next dose due today.   No orders of the defined types were placed in this encounter.  All questions were answered. The patient knows to call the clinic with any problems, questions or concerns.  A total of more than 30 minutes were spent on this encounter with face-to-face time and non-face-to-face time, including preparing to see the patient, ordering tests and/or medications, counseling the patient and coordination of care as outlined above.   Johnston Police PA-C Dept of Hematology and Oncology [facility] Phone:  [phone]   06/08/2023 2:01 PM

## 2023-06-09 ENCOUNTER — Ambulatory Visit: Payer: Medicare Other

## 2023-06-09 DIAGNOSIS — C9000 Multiple myeloma not having achieved remission: Secondary | ICD-10-CM

## 2023-06-09 DIAGNOSIS — R252 Cramp and spasm: Secondary | ICD-10-CM | POA: Diagnosis not present

## 2023-06-09 DIAGNOSIS — R262 Difficulty in walking, not elsewhere classified: Secondary | ICD-10-CM | POA: Diagnosis not present

## 2023-06-09 DIAGNOSIS — R293 Abnormal posture: Secondary | ICD-10-CM

## 2023-06-09 DIAGNOSIS — M542 Cervicalgia: Secondary | ICD-10-CM | POA: Diagnosis not present

## 2023-06-09 DIAGNOSIS — M6281 Muscle weakness (generalized): Secondary | ICD-10-CM | POA: Diagnosis not present

## 2023-06-09 DIAGNOSIS — M546 Pain in thoracic spine: Secondary | ICD-10-CM | POA: Diagnosis not present

## 2023-06-09 DIAGNOSIS — M25552 Pain in left hip: Secondary | ICD-10-CM

## 2023-06-09 DIAGNOSIS — S22000A Wedge compression fracture of unspecified thoracic vertebra, initial encounter for closed fracture: Secondary | ICD-10-CM | POA: Diagnosis not present

## 2023-06-09 NOTE — Therapy (Signed)
 OUTPATIENT PHYSICAL THERAPY  BALANCE TREATMENT NOTE  Patient Name: Travis [name] MRN: [id number] DOB:11-22-1950, 73 y.o., male Today's Date: 06/10/2023  END OF SESSION:  PT End of Session - 06/09/23 1155     Visit Number 14    Date for PT Re-Evaluation 06/17/23    Authorization Type [employer] MCR    Progress Note Due on Visit 20    PT Start Time 1145    PT Stop Time 1230    PT Time Calculation (min) 45 min    Activity Tolerance Patient limited by fatigue    Behavior During Therapy [facility] for tasks assessed/performed              Past Medical History:  Diagnosis Date   A-fib (HCC)    Arthritis    comes and goes; mostly in hands, occasionally elbow (04/05/2017)   Complication of anesthesia    just wanted to sleep alot  for couple days S/P VARICOCELE OR (04/05/2017)   DVT (deep venous thrombosis) (HCC)    LLE   Dysrhythmia    PVCs    GERD (gastroesophageal reflux disease)    History of hiatal hernia    History of radiation therapy    Thoracic Spine- 07/13/22-07/23/22-Dr. Lynwood Nasuti   Hypertension    Impaired glucose tolerance    Multiple myeloma (HCC)    Obesity (BMI 30-39.9) 07/26/2016   OSA on CPAP 07/26/2016   Mild with AHI 12.5/hr now on CPAP at 14cm H2O   PAF (paroxysmal atrial fibrillation) (HCC)    s/p PVI Ablation in 2018 // Flecainide  Rx // Echo 8/21: EF 60-65, no RWMA, mild LVH, normal RV SF, moderate RVE, mild BAE, trivial MR, mild dilation of aortic root (40 mm)   Pneumonia    may have had walking pneumonia a few years ago (04/05/2017)   Pre-diabetes    Thrombophlebitis    Past Surgical History:  Procedure Laterality Date   ATRIAL FIBRILLATION ABLATION  04/05/2017   ATRIAL FIBRILLATION ABLATION N/A 04/05/2017   Procedure: ATRIAL FIBRILLATION ABLATION;  Surgeon: Kelsie Lynwood, MD;  Location: [facility];  Service: Cardiovascular;  Laterality: N/A;   BONE BIOPSY Right 12/17/2021   Procedure: BONE BIOPSY;  Surgeon: Cristy Bonner DASEN, MD;  Location:  [facility];  Service: Orthopedics;  Laterality: Right;   COMPLETE RIGHT HIP REPLACMENT  01/19/2019   EVALUATION UNDER ANESTHESIA WITH HEMORRHOIDECTOMY N/A 02/24/2017   Procedure: EXAM UNDER ANESTHESIA WITH  HEMORRHOIDECTOMY;  Surgeon: Sheldon Standing, MD;  Location: [facility];  Service: General;  Laterality: N/A;   EXCISIONAL TOTAL HIP ARTHROPLASTY WITH ANTIBIOTIC SPACERS Right 09/25/2020   Procedure: EXCISIONAL TOTAL HIP ARTHROPLASTY WITH ANTIBIOTIC SPACERS;  Surgeon: Ernie Cough, MD;  Location: [facility];  Service: Orthopedics;  Laterality: Right;   FLEXIBLE SIGMOIDOSCOPY N/A 04/15/2015   Procedure:  UNSEDATED FLEXIBLE SIGMOIDOSCOPY;  Surgeon: Gladis MARLA Louder, MD;  Location: [facility];  Service: Endoscopy;  Laterality: N/A;   HUMERUS IM NAIL Right 12/17/2021   Procedure: INTRAMEDULLARY (IM) NAIL HUMERAL;  Surgeon: Cristy Bonner DASEN, MD;  Location: [facility];  Service: Orthopedics;  Laterality: Right;   KNEE ARTHROSCOPY Left ~ 2016   POSTERIOR CERVICAL FUSION/FORAMINOTOMY N/A 06/04/2022   Procedure: Thoracic one - Thoracic Four Posterior lateral arthrodesis/ fusion and Thoracic two Corpectomy;  Surgeon: Gillie Duncans, MD;  Location: [facility];  Service: Neurosurgery;  Laterality: N/A;   REIMPLANTATION OF TOTAL HIP Right 12/25/2020   Procedure: REIMPLANTATION/REVISION OF RIGHT TOTAL HIP VERSUS REPEAT IRRIGATION AND  DEBRIDEMENT;  Surgeon: Ernie Cough, MD;  Location: [facility];  Service: Orthopedics;  Laterality: Right;   TESTICLE SURGERY  1988   VARICOCELE   TONSILLECTOMY     VARICOSE VEIN SURGERY Bilateral    Patient Active Problem List   Diagnosis Date Noted   Pancreatic cyst 09/30/2022   Infection due to novel influenza A virus 09/29/2022   Persistent atrial fibrillation (HCC) 07/15/2022   Acute on chronic diastolic heart failure (HCC) 07/03/2022   Pulmonary infiltrates 07/01/2022   Acute respiratory failure with hypoxia (HCC) 07/01/2022   Hyponatremia 06/29/2022    History of thoracic surgery 06/29/2022   Acute DVT of left tibial vein (HCC) 06/21/2022   Hypokalemia 06/21/2022   Vitamin D  deficiency 05/30/2022   Other constipation 05/29/2022   Pathologic compression fracture of spine, initial encounter (HCC) 05/29/2022   Cord compression (HCC) 05/28/2022   Degenerative cervical disc 05/14/2022   Hypercoagulable state due to persistent atrial fibrillation (HCC) 01/11/2022   Multiple myeloma (HCC) 12/10/2021   Medication monitoring encounter 02/27/2021   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   Status post peripherally inserted central catheter (PICC) central line placement 11/06/2020   Drug rash with eosinophilia and systemic symptoms syndrome 11/06/2020   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   PAF (paroxysmal atrial fibrillation) (HCC)    GERD (gastroesophageal reflux disease) 03/29/2018   Hiatal hernia 03/29/2018   History of DVT (deep vein thrombosis) 03/29/2018   CAP (community acquired pneumonia) 03/29/2018   Osteoarthritis 03/29/2018   Neuropathy 08/29/2017   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   Paroxysmal atrial fibrillation with rapid ventricular response (HCC) 04/05/2017   Prolapsed internal hemorrhoids, grade 4, s/p ligation/pexy/hemorrhoidectomy x 2 02/24/2017 02/24/2017   OSA (obstructive sleep apnea) 07/26/2016   Obesity (BMI 30-39.9) 07/26/2016   Atrial fibrillation with RVR (HCC)    Essential hypertension    Chest pain at rest     PCP: Charlott Dorn LABOR, MD   REFERRING PROVIDER: Federico Norleen ONEIDA MADISON, MD   REFERRING DIAG: C90.00 (ICD-10-CM) - Multiple myeloma not having achieved remission (HCC)   THERAPY DIAG:  Pain in left hip  Difficulty in walking, not elsewhere classified  Muscle weakness (generalized)  Cramp and spasm  Multiple myeloma not having achieved remission (HCC)  Abnormal posture  Rationale for Evaluation and Treatment: Rehabilitation  ONSET DATE: Feb 2024    SUBJECTIVE:   SUBJECTIVE STATEMENT: Patient reports the hip is still painful but better than yesterday.     PERTINENT HISTORY: significant for IgA Kappa Multiple Myeloma. He was diagnosed with smouldering myeloma in 2018 . July 2023 he developed right arm pain due to a pathologic fx and he underwent radiation and fixation. In early Feb 2024 he had radiation for pathologic T2 compression fx followed by a T1-T4 fusion.  PAIN:   06/07/23: Are you having pain? Yes: NPRS scale: 4/10 Pain location: R arm and upper traps; between shoulder blades, some low back  Pain description: constant tightness.soreness Aggravating factors: not sure Relieving factors: shoulder rolls, aquatic PT. Hot tub  PRECAUTIONS: Other: Multiple Myeloma with mets to R ribs, R humerus with ORIF for pathological fracture 12/17/21. Lesions have also been found in R rib and also in his skull. Pathologic fx T2 with fusion T1-4 on 06/04/22, Bilateral LE edema (on Lasix ), Afib   RED FLAGS: None   WEIGHT BEARING RESTRICTIONS: No  FALLS:  Has patient fallen in last 6 months? Yes. Number of falls 1 walking to fast and tripped over  feet  LIVING ENVIRONMENT: Lives with: lives with their family and lives with their spouse Lives in: House/apartment Stairs: Yes; Internal: 14 steps; on left going up and External: 3 steps; on right going up Has following equipment at home: Single point cane and Walker - 4 wheeled  OCCUPATION: semi-retired, attorney  PLOF: Independent with household mobility with device  PATIENT GOALS: stretch these muscles out  NEXT MD VISIT: 12/4  OBJECTIVE:  Note: Objective measures were completed at Evaluation unless otherwise noted.  DIAGNOSTIC FINDINGS: see chart  PATIENT SURVEYS:  Eval:  NDI 15 / 50 = 30.0 % 05/17/2023:  Neck Disability Index score: 10 / 50 = 20.0 %  COGNITION: Overall cognitive status: Within functional limits for tasks assessed     SENSATION: Some tingling in his hands and  feet   POSTURE: rounded shoulders, forward head, and increased thoracic kyphosis Sits in significant forward head, thoracic kyphosis  PALPATION: Marked tightness in B cervical paraspinals, levator, UT  CERVICAL ROM: * pain  Active ROM A/PROM (deg) eval A/PROM (deg) 05/19/23  Flexion full full  Extension 4 deg 15  Right lateral flexion 17 20  Left lateral flexion 13* 22  Right rotation 38 32  Left rotation 42 40   (Blank rows = not tested)   SHOULDER AROM (tested in sitting): Flex  R 102 deg, L 123 deg ABD R 88 deg, L 124 deg  Functional IR R thumb to L5, L to T10 Functional ER - can reach behind head easily L, must bend neck forward R  05/19/23: Flex  R 116 deg, L 141 deg ABD R 108 deg, L 139 deg  Functional IR R thumb to L5, L to T10 Functional ER -, to C4-5 R,  to C4-5 L  SHOULDER STRENGTH:  Flex R 4+/5, ABD R 4/5  else 5/5 B    TODAY'S TREATMENT:                                                                                                                              DATE:   06/09/2023 Nustep x 8 min level 3 Seated LAQ with 5 lbs 2 x 10 Seated march with 5 lbs x 20 Seated hip ER with 5 lbs 2 x 10 Seated piriformis stretch x 10 bilateral LE holding 5 sec each Sit to stand from chair with balance pad 2 x 5 Seated hamstring curl x 20 on each LE with green tband Seated clam with red loop Lateral band walks x 3 laps at barre with blue loop Standing hip abduction and extension 2 x 10 each LE  06/07/2023 Nustep x 8 min level 3 Supine hamstring stretch 3 x 30 sec both Supine IT band stretch 3 x 30 sec both Supine heel slides with strap x 10 both Supine piriformis stretch x 10 both LTR x 20 both Hip IR/ER combo x 10 each side Sit to stand from mat table with balance pad x 5  Supine lower trunk rotation x 20 Supine hip rotation with assist x 1 min each side Supine march x 20 Supine piriformis stretch with strap 3 x 30 sec Supine hamstring stretch with strap  3 x  30 sec  06/02/2023 Nustep x 8 min level 3 Lat pull down 40 lbs 2 x 10 Tricep press 20 lbs 2 x 10 Leg Press 80 lbs 3 x 10 (seat 9) Sit to stand from mat table with balance pad x 5 Supine lying on moist heat pad: lower trunk rotation x 20 Supine hip rotation with assist x 1 min each side Supine march x 20 (All supine exercises lying on moist heat pad)  05/27/2023 Nustep level 1 (decreased level due to elevated pain) x 8 min with PT present to discuss status Supine hook lying moist heat to right low back x 5 min prior to stretching, heat left in place for the following stretches: Hamstring stretch x 10 holding 5-10 seconds (to patient tolerance) using stretch strap bilaterally IT band stretch x 10 holding 5-10 seconds (to patient tolerance) using stretch strap bilaterally Single KTC using strap x 10 holding 5-10 sec each bilaterally Modified piriformis stretch with strap x 5 each LE holding 5-10 sec each  Hook lying trunk rotation x 20  )  PATIENT EDUCATION:  Education details: PT eval findings, anticipated POC, initial HEP, and role of DN  Person educated: Patient Education method: Explanation, Demonstration, and Handouts Education comprehension: verbalized understanding and returned demonstration  HOME EXERCISE PROGRAM: Access Code: [email or web address] URL: [email or web address] Date: 03/25/2023 Prepared by: Mliss  Exercises - Seated Cervical Rotation AROM  - 1 x daily - 7 x weekly - 1 sets - 10 reps - 10 hold - Seated Cervical Sidebending AROM  - 1 x daily - 7 x weekly - 1 sets - 10 reps - 5 hold - Seated Cervical Extension AROM  - 1 x daily - 7 x weekly - 1 sets - 10 reps - 10 hold - Seated Scapular Retraction  - 1 x daily - 7 x weekly - 1-3 sets - 10 reps - 2-3 sec hold - Sidelying Thoracic Rotation with Open Book  - 1 x daily - 7 x weekly - 2 sets - 5 reps - Seated Thoracic Extension Arms Overhead  - 1 x daily - 7 x weekly - 1-2 sets - 10 reps - 2-3 sec hold - Seated  Trunk Rotation  - 1 x daily - 7 x weekly - 1-2 sets - 10 reps - 2-3 sec hold - Seated Diagonal Chops with Medicine Ball  - 1 x daily - 7 x weekly - 1-2 sets - 10 reps - 2-3 hold  ASSESSMENT:  CLINICAL IMPRESSION: Mr Cloe is still having left hip pain but it is somewhat dissipated.  He was able to complete more standing tasks today.  He is using cane vs walker.  He will have one more week of PT before he starts treatment.   He would benefit from continuing skilled PT for generalized strengthening and flexibility to restore patient to PLOF.    OBJECTIVE IMPAIRMENTS: decreased activity tolerance, decreased ROM, decreased strength, increased muscle spasms, impaired flexibility, impaired UE functional use, postural dysfunction, and pain.   ACTIVITY LIMITATIONS: lifting, sleeping, bathing, dressing, and reach over head  PARTICIPATION LIMITATIONS: driving and golf  PERSONAL FACTORS: Fitness, Past/current experiences, and 3+ comorbidities: multiple myeloma, previous fractures  are also affecting patient's functional outcome.   REHAB POTENTIAL: Good  CLINICAL DECISION MAKING: Evolving/moderate complexity  EVALUATION COMPLEXITY: Moderate   GOALS: Goals reviewed with patient? Yes  SHORT TERM GOALS: Target date: 04/22/2023   Patient will be independent with initial HEP.  Baseline:  Goal status: MET 05/03/23 2.  Improved mobility in thoracic spine by 50% with exercises.  Baseline:  Goal status: MET 05/03/23   LONG TERM GOALS: Target date: 05/20/2023   Patient will be independent with advanced/ongoing HEP to improve outcomes and carryover.  Baseline:  Goal status: In progress  2.  Patient will report 50% improvement in neck and upper back pain to improve QOL.  Baseline:  Goal status: MET 05/19/23  3.  Patient will demonstrate improved cervical extension to 10 deg Baseline:  Goal status:MET 05/19/23  4.  Patient will demonstrate improved upper back strength as evidenced by improved  sitting and standing posture. Baseline:  Goal status:In Progress  5.  Patient will report 7/50 on NDI to demonstrate improved functional ability.  Baseline:  Goal status: Ongoing (see above)   PLAN:  PT FREQUENCY: 2x/week  PT DURATION: 8 weeks  PLANNED INTERVENTIONS: 97164- PT Re-evaluation, 97110-Therapeutic exercises, 97530- Therapeutic activity, [id number]- Neuromuscular re-education, 97535- Self Care, [id number]- Manual therapy, [id number]- Aquatic Therapy, 97014- Electrical stimulation (unattended), Patient/Family education, Taping, Dry Needling, Cryotherapy, and Moist heat  PLAN FOR NEXT SESSION: Patient will continue until he starts inpatient treatment for his cancer.    No mobilizations due to previous fractures, monitor right hip status, work on thoracic and cervical mobility,  postural strength, hip mobility.     Delon B. Jazmin Ley, PT 06/10/23 10:38 AM [facility] [address], [address] [place], [place] [address] Phone # [phone] Fax [phone]

## 2023-06-10 LAB — KAPPA/LAMBDA LIGHT CHAINS
Kappa free light chain: 221.7 mg/L — ABNORMAL HIGH (ref 3.3–19.4)
Kappa, lambda light chain ratio: 47.17 — ABNORMAL HIGH (ref 0.26–1.65)
Lambda free light chains: 4.7 mg/L — ABNORMAL LOW (ref 5.7–26.3)

## 2023-06-13 LAB — MULTIPLE MYELOMA PANEL, SERUM
Albumin SerPl Elph-Mcnc: 3.1 g/dL (ref 2.9–4.4)
Albumin/Glob SerPl: 1.4 (ref 0.7–1.7)
Alpha 1: 0.3 g/dL (ref 0.0–0.4)
Alpha2 Glob SerPl Elph-Mcnc: 0.9 g/dL (ref 0.4–1.0)
B-Globulin SerPl Elph-Mcnc: 0.8 g/dL (ref 0.7–1.3)
Gamma Glob SerPl Elph-Mcnc: 0.2 g/dL — ABNORMAL LOW (ref 0.4–1.8)
Globulin, Total: 2.3 g/dL (ref 2.2–3.9)
IgA: 20 mg/dL — ABNORMAL LOW (ref 61–437)
IgG (Immunoglobin G), Serum: 189 mg/dL — ABNORMAL LOW (ref 603–1613)
IgM (Immunoglobulin M), Srm: 14 mg/dL — ABNORMAL LOW (ref 15–143)
Total Protein ELP: 5.4 g/dL — ABNORMAL LOW (ref 6.0–8.5)

## 2023-06-14 ENCOUNTER — Ambulatory Visit: Payer: Medicare Other

## 2023-06-14 DIAGNOSIS — M25552 Pain in left hip: Secondary | ICD-10-CM

## 2023-06-14 DIAGNOSIS — R293 Abnormal posture: Secondary | ICD-10-CM | POA: Diagnosis not present

## 2023-06-14 DIAGNOSIS — S22000A Wedge compression fracture of unspecified thoracic vertebra, initial encounter for closed fracture: Secondary | ICD-10-CM | POA: Diagnosis not present

## 2023-06-14 DIAGNOSIS — R252 Cramp and spasm: Secondary | ICD-10-CM

## 2023-06-14 DIAGNOSIS — M6281 Muscle weakness (generalized): Secondary | ICD-10-CM

## 2023-06-14 DIAGNOSIS — C9000 Multiple myeloma not having achieved remission: Secondary | ICD-10-CM | POA: Diagnosis not present

## 2023-06-14 DIAGNOSIS — M542 Cervicalgia: Secondary | ICD-10-CM | POA: Diagnosis not present

## 2023-06-14 DIAGNOSIS — R262 Difficulty in walking, not elsewhere classified: Secondary | ICD-10-CM | POA: Diagnosis not present

## 2023-06-14 DIAGNOSIS — M546 Pain in thoracic spine: Secondary | ICD-10-CM | POA: Diagnosis not present

## 2023-06-14 NOTE — Therapy (Signed)
OUTPATIENT PHYSICAL THERAPY  BALANCE TREATMENT NOTE  Patient Name: Travis [name] MRN: [id number] DOB:08-Mar-1951, 73 y.o., male Today's Date: 06/14/2023  END OF SESSION:  PT End of Session - 06/14/23 1235     Visit Number 15    Date for PT Re-Evaluation 06/17/23    Authorization Type [employer] MCR    Progress Note Due on Visit 20    PT Start Time 1228    PT Stop Time 1312    PT Time Calculation (min) 44 min    Activity Tolerance Patient limited by fatigue    Behavior During Therapy [facility] for tasks assessed/performed              Past Medical History:  Diagnosis Date   A-fib (HCC)    Arthritis    "comes and goes; mostly in hands, occasionally elbow" (04/05/2017)   Complication of anesthesia    "just wanted to sleep alot  for couple days S/P VARICOCELE OR" (04/05/2017)   DVT (deep venous thrombosis) (HCC)    LLE   Dysrhythmia    PVCs    GERD (gastroesophageal reflux disease)    History of hiatal hernia    History of radiation therapy    Thoracic Spine- 07/13/22-07/23/22-Dr. Antony Blackbird   Hypertension    Impaired glucose tolerance    Multiple myeloma (HCC)    Obesity (BMI 30-39.9) 07/26/2016   OSA on CPAP 07/26/2016   Mild with AHI 12.5/hr now on CPAP at 14cm H2O   PAF (paroxysmal atrial fibrillation) (HCC)    s/p PVI Ablation in 2018 // Flecainide Rx // Echo 8/21: EF 60-65, no RWMA, mild LVH, normal RV SF, moderate RVE, mild BAE, trivial MR, mild dilation of aortic root (40 mm)   Pneumonia    "may have had walking pneumonia a few years ago" (04/05/2017)   Pre-diabetes    Thrombophlebitis    Past Surgical History:  Procedure Laterality Date   ATRIAL FIBRILLATION ABLATION  04/05/2017   ATRIAL FIBRILLATION ABLATION N/A 04/05/2017   Procedure: ATRIAL FIBRILLATION ABLATION;  Surgeon: [name], MD;  Location: [facility];  Service: Cardiovascular;  Laterality: N/A;   BONE BIOPSY Right 12/17/2021   Procedure: BONE BIOPSY;  Surgeon: [name], MD;  Location:  [facility];  Service: Orthopedics;  Laterality: Right;   COMPLETE RIGHT HIP REPLACMENT  01/19/2019   EVALUATION UNDER ANESTHESIA WITH HEMORRHOIDECTOMY N/A 02/24/2017   Procedure: EXAM UNDER ANESTHESIA WITH  HEMORRHOIDECTOMY;  Surgeon: [name], MD;  Location: [facility];  Service: General;  Laterality: N/A;   EXCISIONAL TOTAL HIP ARTHROPLASTY WITH ANTIBIOTIC SPACERS Right 09/25/2020   Procedure: EXCISIONAL TOTAL HIP ARTHROPLASTY WITH ANTIBIOTIC SPACERS;  Surgeon: [name], MD;  Location: [facility];  Service: Orthopedics;  Laterality: Right;   FLEXIBLE SIGMOIDOSCOPY N/A 04/15/2015   Procedure:  UNSEDATED FLEXIBLE SIGMOIDOSCOPY;  Surgeon: [name], MD;  Location: [facility];  Service: Endoscopy;  Laterality: N/A;   HUMERUS IM NAIL Right 12/17/2021   Procedure: INTRAMEDULLARY (IM) NAIL HUMERAL;  Surgeon: [name], MD;  Location: [facility];  Service: Orthopedics;  Laterality: Right;   KNEE ARTHROSCOPY Left ~ 2016   POSTERIOR CERVICAL FUSION/FORAMINOTOMY N/A 06/04/2022   Procedure: Thoracic one - Thoracic Four Posterior lateral arthrodesis/ fusion and Thoracic two Corpectomy;  Surgeon: [name], MD;  Location: [facility];  Service: Neurosurgery;  Laterality: N/A;   REIMPLANTATION OF TOTAL HIP Right 12/25/2020   Procedure: REIMPLANTATION/REVISION OF RIGHT TOTAL HIP VERSUS REPEAT IRRIGATION AND  DEBRIDEMENT;  Surgeon: [name], MD;  Location: [facility];  Service: Orthopedics;  Laterality: Right;   TESTICLE SURGERY  1988   VARICOCELE   TONSILLECTOMY     VARICOSE VEIN SURGERY Bilateral    Patient Active Problem List   Diagnosis Date Noted   Pancreatic cyst 09/30/2022   Infection due to novel influenza A virus 09/29/2022   Persistent atrial fibrillation (HCC) 07/15/2022   Acute on chronic diastolic heart failure (HCC) 07/03/2022   Pulmonary infiltrates 07/01/2022   Acute respiratory failure with hypoxia (HCC) 07/01/2022   Hyponatremia 06/29/2022    History of thoracic surgery 06/29/2022   Acute DVT of left tibial vein (HCC) 06/21/2022   Hypokalemia 06/21/2022   Vitamin D deficiency 05/30/2022   Other constipation 05/29/2022   Pathologic compression fracture of spine, initial encounter (HCC) 05/29/2022   Cord compression (HCC) 05/28/2022   Degenerative cervical disc 05/14/2022   Hypercoagulable state due to persistent atrial fibrillation (HCC) 01/11/2022   Multiple myeloma (HCC) 12/10/2021   Medication monitoring encounter 02/27/2021   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   Status post peripherally inserted central catheter (PICC) central line placement 11/06/2020   Drug rash with eosinophilia and systemic symptoms syndrome 11/06/2020   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   PAF (paroxysmal atrial fibrillation) (HCC)    GERD (gastroesophageal reflux disease) 03/29/2018   Hiatal hernia 03/29/2018   History of DVT (deep vein thrombosis) 03/29/2018   CAP (community acquired pneumonia) 03/29/2018   Osteoarthritis 03/29/2018   Neuropathy 08/29/2017   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   Paroxysmal atrial fibrillation with rapid ventricular response (HCC) 04/05/2017   Prolapsed internal hemorrhoids, grade 4, s/p ligation/pexy/hemorrhoidectomy x 2 02/24/2017 02/24/2017   OSA (obstructive sleep apnea) 07/26/2016   Obesity (BMI 30-39.9) 07/26/2016   Atrial fibrillation with RVR (HCC)    Essential hypertension    Chest pain at rest     PCP: [name], MD   REFERRING PROVIDER: Jaci Standard, MD   REFERRING DIAG: C90.00 (ICD-10-CM) - Multiple myeloma not having achieved remission (HCC)   THERAPY DIAG:  Pain in left hip  Difficulty in walking, not elsewhere classified  Muscle weakness (generalized)  Cramp and spasm  Multiple myeloma not having achieved remission [facility])  Rationale for Evaluation and Treatment: Rehabilitation  ONSET DATE: Feb 2024   SUBJECTIVE:   SUBJECTIVE  STATEMENT: Patient reports the hip is still painful.  Feels that the injection didn't help.     PERTINENT HISTORY: significant for IgA Kappa Multiple Myeloma. He was diagnosed with smouldering myeloma in 2018 . July 2023 he developed right arm pain due to a pathologic fx and he underwent radiation and fixation. In early Feb 2024 he had radiation for pathologic T2 compression fx followed by a T1-T4 fusion.  PAIN:   06/07/23: Are you having pain? Yes: NPRS scale: 4/10 Pain location: R arm and upper traps; between shoulder blades, some low back  Pain description: constant tightness.soreness Aggravating factors: not sure Relieving factors: shoulder rolls, aquatic PT. Hot tub  PRECAUTIONS: Other: Multiple Myeloma with mets to R ribs, R humerus with ORIF for pathological fracture 12/17/21. Lesions have also been found in R rib and also in his skull. Pathologic fx T2 with fusion T1-4 on 06/04/22, Bilateral LE edema (on Lasix), Afib   RED FLAGS: None   WEIGHT BEARING RESTRICTIONS: No  FALLS:  Has patient fallen in last 6 months? Yes. Number of falls 1 walking to fast and tripped over  feet  LIVING ENVIRONMENT: Lives with: lives with their family and lives with their spouse Lives in: House/apartment Stairs: Yes; Internal: 14 steps; on left going up and External: 3 steps; on right going up Has following equipment at home: Single point cane and Walker - 4 wheeled  OCCUPATION: semi-retired, attorney  PLOF: Independent with household mobility with device  PATIENT GOALS: stretch these muscles out  NEXT MD VISIT: 12/4  OBJECTIVE:  Note: Objective measures were completed at Evaluation unless otherwise noted.  DIAGNOSTIC FINDINGS: see chart  PATIENT SURVEYS:  Eval:  NDI 15 / 50 = 30.0 % 05/17/2023:  Neck Disability Index score: 10 / 50 = 20.0 %  COGNITION: Overall cognitive status: Within functional limits for tasks assessed     SENSATION: Some tingling in his hands and feet   POSTURE:  rounded shoulders, forward head, and increased thoracic kyphosis Sits in significant forward head, thoracic kyphosis  PALPATION: Marked tightness in B cervical paraspinals, levator, UT  CERVICAL ROM: * pain  Active ROM A/PROM (deg) eval A/PROM (deg) 05/19/23  Flexion full full  Extension 4 deg 15  Right lateral flexion 17 20  Left lateral flexion 13* 22  Right rotation 38 32  Left rotation 42 40   (Blank rows = not tested)   SHOULDER AROM (tested in sitting): Flex  R 102 deg, L 123 deg ABD R 88 deg, L 124 deg  Functional IR R thumb to L5, L to T10 Functional ER - can reach behind head easily L, must bend neck forward R  05/19/23: Flex  R 116 deg, L 141 deg ABD R 108 deg, L 139 deg  Functional IR R thumb to L5, L to T10 Functional ER -, to C4-5 R,  to C4-5 L  SHOULDER STRENGTH:  Flex R 4+/5, ABD R 4/5  else 5/5 B    TODAY'S TREATMENT:                                                                                                                              DATE:   06/14/2023 Nustep x 8 min level 3 Leg Press with 80 lbs x 20 (PT instructed patient to do 20, patient did 50) Seated hip adduction with purple ball x 20  Seated hamstring curl x 20 on each LE with green tband Seated LAQ with 5 lbs 2 x 10 Seated march with 5 lbs x 20 Seated hip ER with 5 lbs 2 x 10 Seated piriformis stretch x 10 bilateral LE holding 5 sec each (using strap) Sit to stand from chair with balance pad 2 x 10 (second set with purple ball for isometric hip adduction) Seated clam with red loop x 20 Ice to left hip x 10 min seated in chair with pillows to assist with contact.    06/09/2023 Nustep x 8 min level 3 Seated LAQ with 5 lbs 2 x 10 Seated march with 5 lbs x 20 Seated hip ER  with 5 lbs 2 x 10 Seated piriformis stretch x 10 bilateral LE holding 5 sec each Sit to stand from chair with balance pad 2 x 5 Seated hamstring curl x 20 on each LE with green tband Seated clam with red  loop Lateral band walks x 3 laps at barre with blue loop Standing hip abduction and extension 2 x 10 each LE  06/07/2023 Nustep x 8 min level 3 Supine hamstring stretch 3 x 30 sec both Supine IT band stretch 3 x 30 sec both Supine heel slides with strap x 10 both Supine piriformis stretch x 10 both LTR x 20 both Hip IR/ER combo x 10 each side Sit to stand from mat table with balance pad x 5 Supine lower trunk rotation x 20 Supine hip rotation with assist x 1 min each side Supine march x 20 Supine piriformis stretch with strap 3 x 30 sec Supine hamstring stretch with strap  3 x 30 sec    PATIENT EDUCATION:  Education details: PT eval findings, anticipated POC, initial HEP, and role of DN  Person educated: Patient Education method: Explanation, Demonstration, and Handouts Education comprehension: verbalized understanding and returned demonstration  HOME EXERCISE PROGRAM: Access Code: [email or web address] URL: [email or web address] Date: 03/25/2023 Prepared by: Raynelle Fanning  Exercises - Seated Cervical Rotation AROM  - 1 x daily - 7 x weekly - 1 sets - 10 reps - 10 hold - Seated Cervical Sidebending AROM  - 1 x daily - 7 x weekly - 1 sets - 10 reps - 5 hold - Seated Cervical Extension AROM  - 1 x daily - 7 x weekly - 1 sets - 10 reps - 10 hold - Seated Scapular Retraction  - 1 x daily - 7 x weekly - 1-3 sets - 10 reps - 2-3 sec hold - Sidelying Thoracic Rotation with Open Book  - 1 x daily - 7 x weekly - 2 sets - 5 reps - Seated Thoracic Extension Arms Overhead  - 1 x daily - 7 x weekly - 1-2 sets - 10 reps - 2-3 sec hold - Seated Trunk Rotation  - 1 x daily - 7 x weekly - 1-2 sets - 10 reps - 2-3 sec hold - Seated Diagonal Chops with Medicine Ball  - 1 x daily - 7 x weekly - 1-2 sets - 10 reps - 2-3 hold  ASSESSMENT:  CLINICAL IMPRESSION: Travis [name] is still having left hip pain.  He had bad days Saturday and Sunday but yesterday was better.  Today he woke up with pain again.  He was  able to complete 2 sets of 10 sit to stand with addition of isometric hip adduction.  He is using cane vs walker today.  He will have one more visit of PT before he starts treatment.   He would benefit from continuing skilled PT for generalized strengthening and flexibility to restore patient to PLOF.    OBJECTIVE IMPAIRMENTS: decreased activity tolerance, decreased ROM, decreased strength, increased muscle spasms, impaired flexibility, impaired UE functional use, postural dysfunction, and pain.   ACTIVITY LIMITATIONS: lifting, sleeping, bathing, dressing, and reach over head  PARTICIPATION LIMITATIONS: driving and golf  PERSONAL FACTORS: Fitness, Past/current experiences, and 3+ comorbidities: multiple myeloma, previous fractures  are also affecting patient's functional outcome.   REHAB POTENTIAL: Good  CLINICAL DECISION MAKING: Evolving/moderate complexity  EVALUATION COMPLEXITY: Moderate   GOALS: Goals reviewed with patient? Yes  SHORT TERM GOALS: Target date: 04/22/2023   Patient will be independent with initial HEP.  Baseline:  Goal status: MET 05/03/23 2.  Improved mobility in thoracic spine by 50% with exercises.  Baseline:  Goal status: MET 05/03/23   LONG TERM GOALS: Target date: 05/20/2023   Patient will be independent with advanced/ongoing HEP to improve outcomes and carryover.  Baseline:  Goal status: In progress  2.  Patient will report 50% improvement in neck and upper back pain to improve QOL.  Baseline:  Goal status: MET 05/19/23  3.  Patient will demonstrate improved cervical extension to 10 deg Baseline:  Goal status:MET 05/19/23  4.  Patient will demonstrate improved upper back strength as evidenced by improved sitting and standing posture. Baseline:  Goal status:In Progress  5.  Patient will report 7/50 on NDI to demonstrate improved functional ability.  Baseline:  Goal status: Ongoing (see above)   PLAN:  PT FREQUENCY: 2x/week  PT DURATION: 8  weeks  PLANNED INTERVENTIONS: 97164- PT Re-evaluation, 97110-Therapeutic exercises, 97530- Therapeutic activity, [id number]- Neuromuscular re-education, 97535- Self Care, [id number]- Manual therapy, [id number]- Aquatic Therapy, 97014- Electrical stimulation (unattended), Patient/Family education, Taping, Dry Needling, Cryotherapy, and Moist heat  PLAN FOR NEXT SESSION: One more visit.  Complete DC and give DC plan.      Victorino Dike B. Delbra Zellars, PT 06/14/23 1:08 PM [facility] [address] [place], [place] [address] Phone # [phone] Fax [phone]

## 2023-06-15 ENCOUNTER — Other Ambulatory Visit: Payer: Self-pay | Admitting: Hematology and Oncology

## 2023-06-15 MED ORDER — TRAMADOL HCL 50 MG PO TABS: 50.0000 mg | ORAL_TABLET | Freq: Four times a day (QID) | ORAL | 0 refills | Status: DC | PRN

## 2023-06-16 ENCOUNTER — Ambulatory Visit: Payer: Medicare Other

## 2023-06-16 DIAGNOSIS — M546 Pain in thoracic spine: Secondary | ICD-10-CM

## 2023-06-16 DIAGNOSIS — S22000A Wedge compression fracture of unspecified thoracic vertebra, initial encounter for closed fracture: Secondary | ICD-10-CM

## 2023-06-16 DIAGNOSIS — R293 Abnormal posture: Secondary | ICD-10-CM | POA: Diagnosis not present

## 2023-06-16 DIAGNOSIS — C9000 Multiple myeloma not having achieved remission: Secondary | ICD-10-CM | POA: Diagnosis not present

## 2023-06-16 DIAGNOSIS — M25552 Pain in left hip: Secondary | ICD-10-CM

## 2023-06-16 DIAGNOSIS — M542 Cervicalgia: Secondary | ICD-10-CM | POA: Diagnosis not present

## 2023-06-16 DIAGNOSIS — R262 Difficulty in walking, not elsewhere classified: Secondary | ICD-10-CM

## 2023-06-16 DIAGNOSIS — M6281 Muscle weakness (generalized): Secondary | ICD-10-CM | POA: Diagnosis not present

## 2023-06-16 DIAGNOSIS — R252 Cramp and spasm: Secondary | ICD-10-CM

## 2023-06-16 NOTE — Therapy (Signed)
OUTPATIENT PHYSICAL THERAPY  BALANCE TREATMENT NOTE PHYSICAL THERAPY DISCHARGE SUMMARY  Visits from Start of Care: 16  Current functional level related to goals / functional outcomes: See below   Remaining deficits: See below   Education / Equipment: See below   Patient agrees to discharge. Patient goals were partially met. Patient is being discharged due to a change in medical status.   Patient Name: Travis [name] MRN: [id number] DOB:1951/02/04, 73 y.o., male Today's Date: 06/16/2023  END OF SESSION:  PT End of Session - 06/16/23 1157     Visit Number 16    Date for PT Re-Evaluation 06/17/23    Authorization Type [employer] MCR    Progress Note Due on Visit 20    PT Start Time 1145    PT Stop Time 1230    PT Time Calculation (min) 45 min    Activity Tolerance Patient limited by fatigue    Behavior During Therapy [facility] for tasks assessed/performed              Past Medical History:  Diagnosis Date   A-fib (HCC)    Arthritis    "comes and goes; mostly in hands, occasionally elbow" (04/05/2017)   Complication of anesthesia    "just wanted to sleep alot  for couple days S/P VARICOCELE OR" (04/05/2017)   DVT (deep venous thrombosis) (HCC)    LLE   Dysrhythmia    PVCs    GERD (gastroesophageal reflux disease)    History of hiatal hernia    History of radiation therapy    Thoracic Spine- 07/13/22-07/23/22-Dr. Antony Blackbird   Hypertension    Impaired glucose tolerance    Multiple myeloma (HCC)    Obesity (BMI 30-39.9) 07/26/2016   OSA on CPAP 07/26/2016   Mild with AHI 12.5/hr now on CPAP at 14cm H2O   PAF (paroxysmal atrial fibrillation) (HCC)    s/p PVI Ablation in 2018 // Flecainide Rx // Echo 8/21: EF 60-65, no RWMA, mild LVH, normal RV SF, moderate RVE, mild BAE, trivial MR, mild dilation of aortic root (40 mm)   Pneumonia    "may have had walking pneumonia a few years ago" (04/05/2017)   Pre-diabetes    Thrombophlebitis    Past Surgical History:  Procedure  Laterality Date   ATRIAL FIBRILLATION ABLATION  04/05/2017   ATRIAL FIBRILLATION ABLATION N/A 04/05/2017   Procedure: ATRIAL FIBRILLATION ABLATION;  Surgeon: [name], MD;  Location: [facility];  Service: Cardiovascular;  Laterality: N/A;   BONE BIOPSY Right 12/17/2021   Procedure: BONE BIOPSY;  Surgeon: [name], MD;  Location: [facility];  Service: Orthopedics;  Laterality: Right;   COMPLETE RIGHT HIP REPLACMENT  01/19/2019   EVALUATION UNDER ANESTHESIA WITH HEMORRHOIDECTOMY N/A 02/24/2017   Procedure: EXAM UNDER ANESTHESIA WITH  HEMORRHOIDECTOMY;  Surgeon: [name], MD;  Location: [facility];  Service: General;  Laterality: N/A;   EXCISIONAL TOTAL HIP ARTHROPLASTY WITH ANTIBIOTIC SPACERS Right 09/25/2020   Procedure: EXCISIONAL TOTAL HIP ARTHROPLASTY WITH ANTIBIOTIC SPACERS;  Surgeon: [name], MD;  Location: [facility];  Service: Orthopedics;  Laterality: Right;   FLEXIBLE SIGMOIDOSCOPY N/A 04/15/2015   Procedure:  UNSEDATED FLEXIBLE SIGMOIDOSCOPY;  Surgeon: [name], MD;  Location: [facility];  Service: Endoscopy;  Laterality: N/A;   HUMERUS IM NAIL Right 12/17/2021   Procedure: INTRAMEDULLARY (IM) NAIL HUMERAL;  Surgeon: [name], MD;  Location:  SURGERY CENTER;  Service: Orthopedics;  Laterality: Right;   KNEE ARTHROSCOPY Left ~  2016   POSTERIOR CERVICAL FUSION/FORAMINOTOMY N/A 06/04/2022   Procedure: Thoracic one - Thoracic Four Posterior lateral arthrodesis/ fusion and Thoracic two Corpectomy;  Surgeon: [name], MD;  Location: [facility];  Service: Neurosurgery;  Laterality: N/A;   REIMPLANTATION OF TOTAL HIP Right 12/25/2020   Procedure: REIMPLANTATION/REVISION OF RIGHT TOTAL HIP VERSUS REPEAT IRRIGATION AND DEBRIDEMENT;  Surgeon: [name], MD;  Location: [facility];  Service: Orthopedics;  Laterality: Right;   TESTICLE SURGERY  1988   VARICOCELE   TONSILLECTOMY     VARICOSE VEIN SURGERY Bilateral    Patient Active Problem List    Diagnosis Date Noted   Pancreatic cyst 09/30/2022   Infection due to novel influenza A virus 09/29/2022   Persistent atrial fibrillation (HCC) 07/15/2022   Acute on chronic diastolic heart failure (HCC) 07/03/2022   Pulmonary infiltrates 07/01/2022   Acute respiratory failure with hypoxia (HCC) 07/01/2022   Hyponatremia 06/29/2022   History of thoracic surgery 06/29/2022   Acute DVT of left tibial vein (HCC) 06/21/2022   Hypokalemia 06/21/2022   Vitamin D deficiency 05/30/2022   Other constipation 05/29/2022   Pathologic compression fracture of spine, initial encounter (HCC) 05/29/2022   Cord compression (HCC) 05/28/2022   Degenerative cervical disc 05/14/2022   Hypercoagulable state due to persistent atrial fibrillation (HCC) 01/11/2022   Multiple myeloma (HCC) 12/10/2021   Medication monitoring encounter 02/27/2021   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   Status post peripherally inserted central catheter (PICC) central line placement 11/06/2020   Drug rash with eosinophilia and systemic symptoms syndrome 11/06/2020   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   PAF (paroxysmal atrial fibrillation) (HCC)    GERD (gastroesophageal reflux disease) 03/29/2018   Hiatal hernia 03/29/2018   History of DVT (deep vein thrombosis) 03/29/2018   CAP (community acquired pneumonia) 03/29/2018   Osteoarthritis 03/29/2018   Neuropathy 08/29/2017   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   Paroxysmal atrial fibrillation with rapid ventricular response (HCC) 04/05/2017   Prolapsed internal hemorrhoids, grade 4, s/p ligation/pexy/hemorrhoidectomy x 2 02/24/2017 02/24/2017   OSA (obstructive sleep apnea) 07/26/2016   Obesity (BMI 30-39.9) 07/26/2016   Atrial fibrillation with RVR (HCC)    Essential hypertension    Chest pain at rest     PCP: [name], MD   REFERRING PROVIDER: Jaci Standard, MD   REFERRING DIAG: C90.00 (ICD-10-CM) - Multiple myeloma  not having achieved remission (HCC)   THERAPY DIAG:  Abnormal posture  Pain in thoracic spine  Difficulty in walking, not elsewhere classified  Cramp and spasm  Muscle weakness (generalized)  Cervicalgia  Compression fracture of thoracic vertebra, unspecified thoracic vertebral level, initial encounter (HCC)  Multiple myeloma not having achieved remission (HCC)  Pain in left hip  Rationale for Evaluation and Treatment: Rehabilitation  ONSET DATE: Feb 2024   SUBJECTIVE:   SUBJECTIVE STATEMENT: Patient reports the hip was really sore after last session.  However, the pain has been very mild yesterday and today.   PERTINENT HISTORY: significant for IgA Kappa Multiple Myeloma. He was diagnosed with smouldering myeloma in 2018 . July 2023 he developed right arm pain due to a pathologic fx and he underwent radiation and fixation. In early Feb 2024 he had radiation for pathologic T2 compression fx followed by a T1-T4 fusion.  PAIN:   06/16/23: Are you having pain? Yes: NPRS scale: 2/10 Pain location: R arm and upper traps; between shoulder blades, some low back  Pain description: constant tightness.soreness Aggravating factors:  not sure Relieving factors: shoulder rolls, aquatic PT. Hot tub  PRECAUTIONS: Other: Multiple Myeloma with mets to R ribs, R humerus with ORIF for pathological fracture 12/17/21. Lesions have also been found in R rib and also in his skull. Pathologic fx T2 with fusion T1-4 on 06/04/22, Bilateral LE edema (on Lasix), Afib   RED FLAGS: None   WEIGHT BEARING RESTRICTIONS: No  FALLS:  Has patient fallen in last 6 months? Yes. Number of falls 1 walking to fast and tripped over feet  LIVING ENVIRONMENT: Lives with: lives with their family and lives with their spouse Lives in: House/apartment Stairs: Yes; Internal: 14 steps; on left going up and External: 3 steps; on right going up Has following equipment at home: Single point cane and Walker - 4  wheeled  OCCUPATION: semi-retired, attorney  PLOF: Independent with household mobility with device  PATIENT GOALS: stretch these muscles out  NEXT MD VISIT: 12/4  OBJECTIVE:  Note: Objective measures were completed at Evaluation unless otherwise noted.  DIAGNOSTIC FINDINGS: see chart  PATIENT SURVEYS:  Eval:  NDI 15 / 50 = 30.0 % 05/17/2023:  Neck Disability Index score: 10 / 50 = 20.0 %  COGNITION: Overall cognitive status: Within functional limits for tasks assessed     SENSATION: Some tingling in his hands and feet   POSTURE: rounded shoulders, forward head, and increased thoracic kyphosis Sits in significant forward head, thoracic kyphosis  PALPATION: Marked tightness in B cervical paraspinals, levator, UT  CERVICAL ROM: * pain  Active ROM A/PROM (deg) eval A/PROM (deg) 05/19/23  Flexion full full  Extension 4 deg 15  Right lateral flexion 17 20  Left lateral flexion 13* 22  Right rotation 38 32  Left rotation 42 40   (Blank rows = not tested)   SHOULDER AROM (tested in sitting): Flex  R 102 deg, L 123 deg ABD R 88 deg, L 124 deg  Functional IR R thumb to L5, L to T10 Functional ER - can reach behind head easily L, must bend neck forward R  05/19/23: Flex  R 116 deg, L 141 deg ABD R 108 deg, L 139 deg  Functional IR R thumb to L5, L to T10 Functional ER -, to C4-5 R,  to C4-5 L  SHOULDER STRENGTH:  Flex R 4+/5, ABD R 4/5  else 5/5 B    TODAY'S TREATMENT:                                                                                                                              DATE:   06/16/2023 Nustep x 8 min level 3 Leg Press with 90 lbs x 20 (PT instructed patient to do 20, patient did 50) Seated clam with red loop x 20 Seated LAQ with 5 lbs 2 x 10 Seated march with 5 lbs x 20 Seated hip ER with 5 lbs 2 x 10 Seated hip adduction with purple ball x 20  Created  updated HEP and handouts provided Reviewed all c spine exercises: chin retraction,  scapular retraction, thoracic extension with hands overhead, thoracic rotation (modified to seated- so that patient could do while sitting and waiting on MD for upcoming procedures) Reminded of Sit to stand from chair with balance pad 2 x 10  DC plan provided with updated HEP  06/14/2023 Nustep x 8 min level 3 Leg Press with 80 lbs x 20 (PT instructed patient to do 20, patient did 50) Seated hip adduction with purple ball x 20  Seated hamstring curl x 20 on each LE with green tband Seated LAQ with 5 lbs 2 x 10 Seated march with 5 lbs x 20 Seated hip ER with 5 lbs 2 x 10 Seated piriformis stretch x 10 bilateral LE holding 5 sec each (using strap) Sit to stand from chair with balance pad 2 x 10 (second set with purple ball for isometric hip adduction) Seated clam with red loop x 20 Ice to left hip x 10 min seated in chair with pillows to assist with contact.    06/09/2023 Nustep x 8 min level 3 Seated LAQ with 5 lbs 2 x 10 Seated march with 5 lbs x 20 Seated hip ER with 5 lbs 2 x 10 Seated piriformis stretch x 10 bilateral LE holding 5 sec each Sit to stand from chair with balance pad 2 x 5 Seated hamstring curl x 20 on each LE with green tband Seated clam with red loop Lateral band walks x 3 laps at barre with blue loop Standing hip abduction and extension 2 x 10 each LE  PATIENT EDUCATION:  Education details: PT eval findings, anticipated POC, initial HEP, and role of DN  Person educated: Patient Education method: Explanation, Demonstration, and Handouts Education comprehension: verbalized understanding and returned demonstration  HOME EXERCISE PROGRAM: Access Code: [email or web address] URL: [email or web address] Date: 06/16/2023 Prepared by: Mikey Kirschner  Exercises - Seated Cervical Rotation AROM  - 1 x daily - 7 x weekly - 1 sets - 10 reps - 10 hold - Seated Cervical Sidebending AROM  - 1 x daily - 7 x weekly - 1 sets - 10 reps - 5 hold - Seated Cervical Extension  AROM  - 1 x daily - 7 x weekly - 1 sets - 10 reps - 10 hold - Seated Scapular Retraction  - 1 x daily - 7 x weekly - 1-3 sets - 10 reps - 2-3 sec hold - Sidelying Thoracic Rotation with Open Book  - 1 x daily - 7 x weekly - 2 sets - 5 reps - Seated Thoracic Extension Arms Overhead  - 1 x daily - 7 x weekly - 1-2 sets - 10 reps - 2-3 sec hold - Seated Trunk Rotation  - 1 x daily - 7 x weekly - 1-2 sets - 10 reps - 2-3 sec hold - Seated Diagonal Chops with Medicine Ball  - 1 x daily - 7 x weekly - 1-2 sets - 10 reps - 2-3 hold - Seated Long Arc Quad  - 1 x daily - 7 x weekly - 3 sets - 10 reps - Seated March  - 1 x daily - 7 x weekly - 3 sets - 10 reps - Seated Hip External Rotation AROM  - 1 x daily - 7 x weekly - 3 sets - 10 reps - Supine Hamstring Stretch with Strap  - 1 x daily - 7 x weekly - 1 sets - 3 reps - 20 sec hold -  Supine ITB Stretch with Strap  - 1 x daily - 7 x weekly - 1 sets - 3 reps - 20 sec hold - Seated Piriformis Stretch  - 1 x daily - 7 x weekly - 1 sets - 3 reps - 30 sec hold - Seated Hip Adduction Squeeze with Ball  - 1 x daily - 7 x weekly - 3 sets - 10 reps - 2-3 sec hold - Sit to Stand Without Arm Support  - 1 x daily - 7 x weekly - 1 sets - 10 reps - Seated 3 Way Exercise Ball Roll Out Stretch  - 1 x daily - 7 x weekly - 1 sets - 10 reps  ASSESSMENT:  CLINICAL IMPRESSION: Annette Stable is making slight progress with his hip issues.  He has a good handle on his neck pain but needs to continue with postural exercises as his posture is severely compromised.  His mobility is quite limited due to posture and limited joint mobility, especially in the hips.  He is compliant with his HEP and is well motivated.  He will begin his inpatient cancer treatments next week which will last 5 weeks.  He is prepared to take his exercise sheets and all equipment that he will need with him.  He has good support from his wife.  We will DC at this time for him to begin his cancer treatments.     OBJECTIVE IMPAIRMENTS: decreased activity tolerance, decreased ROM, decreased strength, increased muscle spasms, impaired flexibility, impaired UE functional use, postural dysfunction, and pain.   ACTIVITY LIMITATIONS: lifting, sleeping, bathing, dressing, and reach over head  PARTICIPATION LIMITATIONS: driving and golf  PERSONAL FACTORS: Fitness, Past/current experiences, and 3+ comorbidities: multiple myeloma, previous fractures  are also affecting patient's functional outcome.   REHAB POTENTIAL: Good  CLINICAL DECISION MAKING: Evolving/moderate complexity  EVALUATION COMPLEXITY: Moderate   GOALS: Goals reviewed with patient? Yes  SHORT TERM GOALS: Target date: 04/22/2023   Patient will be independent with initial HEP.  Baseline:  Goal status: MET 05/03/23 2.  Improved mobility in thoracic spine by 50% with exercises.  Baseline:  Goal status: MET 05/03/23   LONG TERM GOALS: Target date: 05/20/2023   Patient will be independent with advanced/ongoing HEP to improve outcomes and carryover.  Baseline:  Goal status: In progress  2.  Patient will report 50% improvement in neck and upper back pain to improve QOL.  Baseline:  Goal status: MET 05/19/23  3.  Patient will demonstrate improved cervical extension to 10 deg Baseline:  Goal status:MET 05/19/23  4.  Patient will demonstrate improved upper back strength as evidenced by improved sitting and standing posture. Baseline:  Goal status:Not met  5.  Patient will report 7/50 on NDI to demonstrate improved functional ability.  Baseline:  Goal status: Ongoing (see above)   PLAN:  PT FREQUENCY: 2x/week  PT DURATION: 8 weeks  PLANNED INTERVENTIONS: 97164- PT Re-evaluation, 97110-Therapeutic exercises, 97530- Therapeutic activity, [id number]- Neuromuscular re-education, 97535- Self Care, [id number]- Manual therapy, [id number]- Aquatic Therapy, 97014- Electrical stimulation (unattended), Patient/Family education, Taping, Dry Needling,  Cryotherapy, and Moist heat  PLAN FOR NEXT SESSION: DCVictorino Dike B. Nohemi Nicklaus, PT 06/16/23 2:54 PM [facility] [address], [address] [place], [place] [address] Phone # [phone] Fax [phone]

## 2023-06-17 DIAGNOSIS — Z86718 Personal history of other venous thrombosis and embolism: Secondary | ICD-10-CM | POA: Diagnosis not present

## 2023-06-17 DIAGNOSIS — I480 Paroxysmal atrial fibrillation: Secondary | ICD-10-CM | POA: Diagnosis not present

## 2023-06-17 DIAGNOSIS — Z79899 Other long term (current) drug therapy: Secondary | ICD-10-CM | POA: Diagnosis not present

## 2023-06-17 DIAGNOSIS — D809 Immunodeficiency with predominantly antibody defects, unspecified: Secondary | ICD-10-CM | POA: Diagnosis not present

## 2023-06-17 DIAGNOSIS — C9000 Multiple myeloma not having achieved remission: Secondary | ICD-10-CM | POA: Diagnosis not present

## 2023-06-17 DIAGNOSIS — Z7901 Long term (current) use of anticoagulants: Secondary | ICD-10-CM | POA: Diagnosis not present

## 2023-06-17 DIAGNOSIS — G4733 Obstructive sleep apnea (adult) (pediatric): Secondary | ICD-10-CM | POA: Diagnosis not present

## 2023-06-20 ENCOUNTER — Encounter: Payer: Self-pay | Admitting: Internal Medicine

## 2023-06-20 NOTE — Telephone Encounter (Signed)
 I called the Travis [name] and he says his BP has been fluctuating up and down but the systolic has been staying more in the 150-160 range.... Bottom number in the 70's.   He has not been having much edema and has been feeling fairly well.   He has been taking the Hydralazine 100 mg TID and the Entresto 49/51 BID.   His last OV 05/24/23 his BP was 136/68.   His last note:  HTN.. BP is Fair he is only taking Entresto daily; increase to 2x per day Follow for now  Will send to Dr [name] to review.

## 2023-06-21 DIAGNOSIS — C9000 Multiple myeloma not having achieved remission: Secondary | ICD-10-CM | POA: Diagnosis not present

## 2023-06-21 DIAGNOSIS — Z452 Encounter for adjustment and management of vascular access device: Secondary | ICD-10-CM | POA: Diagnosis not present

## 2023-06-21 DIAGNOSIS — I5033 Acute on chronic diastolic (congestive) heart failure: Secondary | ICD-10-CM | POA: Diagnosis not present

## 2023-06-21 DIAGNOSIS — I10 Essential (primary) hypertension: Secondary | ICD-10-CM | POA: Diagnosis not present

## 2023-06-21 DIAGNOSIS — I110 Hypertensive heart disease with heart failure: Secondary | ICD-10-CM | POA: Diagnosis not present

## 2023-06-21 DIAGNOSIS — Z9481 Bone marrow transplant status: Secondary | ICD-10-CM | POA: Diagnosis not present

## 2023-06-22 DIAGNOSIS — Z79899 Other long term (current) drug therapy: Secondary | ICD-10-CM | POA: Diagnosis not present

## 2023-06-22 DIAGNOSIS — G4733 Obstructive sleep apnea (adult) (pediatric): Secondary | ICD-10-CM | POA: Diagnosis not present

## 2023-06-22 DIAGNOSIS — Z5111 Encounter for antineoplastic chemotherapy: Secondary | ICD-10-CM | POA: Diagnosis not present

## 2023-06-22 DIAGNOSIS — I4891 Unspecified atrial fibrillation: Secondary | ICD-10-CM | POA: Diagnosis not present

## 2023-06-22 DIAGNOSIS — Z7901 Long term (current) use of anticoagulants: Secondary | ICD-10-CM | POA: Diagnosis not present

## 2023-06-22 DIAGNOSIS — C9000 Multiple myeloma not having achieved remission: Secondary | ICD-10-CM | POA: Diagnosis not present

## 2023-06-22 DIAGNOSIS — R9431 Abnormal electrocardiogram [ECG] [EKG]: Secondary | ICD-10-CM | POA: Diagnosis not present

## 2023-06-22 DIAGNOSIS — I480 Paroxysmal atrial fibrillation: Secondary | ICD-10-CM | POA: Diagnosis not present

## 2023-06-22 DIAGNOSIS — Z86718 Personal history of other venous thrombosis and embolism: Secondary | ICD-10-CM | POA: Diagnosis not present

## 2023-06-22 DIAGNOSIS — I10 Essential (primary) hypertension: Secondary | ICD-10-CM | POA: Diagnosis not present

## 2023-06-23 DIAGNOSIS — C9000 Multiple myeloma not having achieved remission: Secondary | ICD-10-CM | POA: Diagnosis not present

## 2023-06-23 DIAGNOSIS — Z79899 Other long term (current) drug therapy: Secondary | ICD-10-CM | POA: Diagnosis not present

## 2023-06-23 DIAGNOSIS — Z5111 Encounter for antineoplastic chemotherapy: Secondary | ICD-10-CM | POA: Diagnosis not present

## 2023-06-24 DIAGNOSIS — C9000 Multiple myeloma not having achieved remission: Secondary | ICD-10-CM | POA: Diagnosis not present

## 2023-06-27 DIAGNOSIS — Z5112 Encounter for antineoplastic immunotherapy: Secondary | ICD-10-CM | POA: Diagnosis not present

## 2023-06-27 DIAGNOSIS — Z79899 Other long term (current) drug therapy: Secondary | ICD-10-CM | POA: Diagnosis not present

## 2023-06-27 DIAGNOSIS — C9000 Multiple myeloma not having achieved remission: Secondary | ICD-10-CM | POA: Diagnosis not present

## 2023-06-28 DIAGNOSIS — C9000 Multiple myeloma not having achieved remission: Secondary | ICD-10-CM | POA: Diagnosis not present

## 2023-06-29 DIAGNOSIS — C9000 Multiple myeloma not having achieved remission: Secondary | ICD-10-CM | POA: Diagnosis not present

## 2023-06-30 DIAGNOSIS — R7881 Bacteremia: Secondary | ICD-10-CM | POA: Diagnosis not present

## 2023-06-30 DIAGNOSIS — D709 Neutropenia, unspecified: Secondary | ICD-10-CM | POA: Diagnosis not present

## 2023-06-30 DIAGNOSIS — Z888 Allergy status to other drugs, medicaments and biological substances status: Secondary | ICD-10-CM | POA: Diagnosis not present

## 2023-06-30 DIAGNOSIS — Z86718 Personal history of other venous thrombosis and embolism: Secondary | ICD-10-CM | POA: Diagnosis not present

## 2023-06-30 DIAGNOSIS — Z92850 Personal history of chimeric antigen receptor t-cell therapy: Secondary | ICD-10-CM | POA: Diagnosis not present

## 2023-06-30 DIAGNOSIS — D649 Anemia, unspecified: Secondary | ICD-10-CM | POA: Diagnosis not present

## 2023-06-30 DIAGNOSIS — Z96641 Presence of right artificial hip joint: Secondary | ICD-10-CM | POA: Diagnosis not present

## 2023-06-30 DIAGNOSIS — D61810 Antineoplastic chemotherapy induced pancytopenia: Secondary | ICD-10-CM | POA: Diagnosis not present

## 2023-06-30 DIAGNOSIS — Z881 Allergy status to other antibiotic agents status: Secondary | ICD-10-CM | POA: Diagnosis not present

## 2023-06-30 DIAGNOSIS — I110 Hypertensive heart disease with heart failure: Secondary | ICD-10-CM | POA: Diagnosis not present

## 2023-06-30 DIAGNOSIS — Z792 Long term (current) use of antibiotics: Secondary | ICD-10-CM | POA: Diagnosis not present

## 2023-06-30 DIAGNOSIS — R931 Abnormal findings on diagnostic imaging of heart and coronary circulation: Secondary | ICD-10-CM | POA: Diagnosis not present

## 2023-06-30 DIAGNOSIS — D89832 Cytokine release syndrome, grade 2: Secondary | ICD-10-CM | POA: Diagnosis not present

## 2023-06-30 DIAGNOSIS — Z452 Encounter for adjustment and management of vascular access device: Secondary | ICD-10-CM | POA: Diagnosis not present

## 2023-06-30 DIAGNOSIS — Z4682 Encounter for fitting and adjustment of non-vascular catheter: Secondary | ICD-10-CM | POA: Diagnosis not present

## 2023-06-30 DIAGNOSIS — E876 Hypokalemia: Secondary | ICD-10-CM | POA: Diagnosis not present

## 2023-06-30 DIAGNOSIS — I5032 Chronic diastolic (congestive) heart failure: Secondary | ICD-10-CM | POA: Diagnosis not present

## 2023-06-30 DIAGNOSIS — B957 Other staphylococcus as the cause of diseases classified elsewhere: Secondary | ICD-10-CM | POA: Diagnosis not present

## 2023-06-30 DIAGNOSIS — K5900 Constipation, unspecified: Secondary | ICD-10-CM | POA: Diagnosis not present

## 2023-06-30 DIAGNOSIS — R001 Bradycardia, unspecified: Secondary | ICD-10-CM | POA: Diagnosis not present

## 2023-06-30 DIAGNOSIS — I440 Atrioventricular block, first degree: Secondary | ICD-10-CM | POA: Diagnosis not present

## 2023-06-30 DIAGNOSIS — Z1152 Encounter for screening for COVID-19: Secondary | ICD-10-CM | POA: Diagnosis not present

## 2023-06-30 DIAGNOSIS — R5081 Fever presenting with conditions classified elsewhere: Secondary | ICD-10-CM | POA: Diagnosis not present

## 2023-06-30 DIAGNOSIS — R918 Other nonspecific abnormal finding of lung field: Secondary | ICD-10-CM | POA: Diagnosis not present

## 2023-06-30 DIAGNOSIS — T451X5A Adverse effect of antineoplastic and immunosuppressive drugs, initial encounter: Secondary | ICD-10-CM | POA: Diagnosis not present

## 2023-06-30 DIAGNOSIS — T8082XA Complication of immune effector cellular therapy, initial encounter: Secondary | ICD-10-CM | POA: Diagnosis not present

## 2023-06-30 DIAGNOSIS — C9000 Multiple myeloma not having achieved remission: Secondary | ICD-10-CM | POA: Diagnosis not present

## 2023-06-30 DIAGNOSIS — G4733 Obstructive sleep apnea (adult) (pediatric): Secondary | ICD-10-CM | POA: Diagnosis not present

## 2023-06-30 DIAGNOSIS — Z7901 Long term (current) use of anticoagulants: Secondary | ICD-10-CM | POA: Diagnosis not present

## 2023-06-30 DIAGNOSIS — I4891 Unspecified atrial fibrillation: Secondary | ICD-10-CM | POA: Diagnosis not present

## 2023-06-30 DIAGNOSIS — Z79899 Other long term (current) drug therapy: Secondary | ICD-10-CM | POA: Diagnosis not present

## 2023-07-01 DIAGNOSIS — E876 Hypokalemia: Secondary | ICD-10-CM | POA: Diagnosis not present

## 2023-07-01 DIAGNOSIS — Z1152 Encounter for screening for COVID-19: Secondary | ICD-10-CM | POA: Diagnosis not present

## 2023-07-01 DIAGNOSIS — R7881 Bacteremia: Secondary | ICD-10-CM | POA: Diagnosis not present

## 2023-07-01 DIAGNOSIS — T451X5A Adverse effect of antineoplastic and immunosuppressive drugs, initial encounter: Secondary | ICD-10-CM | POA: Diagnosis not present

## 2023-07-01 DIAGNOSIS — Z7901 Long term (current) use of anticoagulants: Secondary | ICD-10-CM | POA: Diagnosis not present

## 2023-07-01 DIAGNOSIS — D649 Anemia, unspecified: Secondary | ICD-10-CM | POA: Diagnosis not present

## 2023-07-01 DIAGNOSIS — Z881 Allergy status to other antibiotic agents status: Secondary | ICD-10-CM | POA: Diagnosis not present

## 2023-07-01 DIAGNOSIS — R918 Other nonspecific abnormal finding of lung field: Secondary | ICD-10-CM | POA: Diagnosis not present

## 2023-07-01 DIAGNOSIS — Z4682 Encounter for fitting and adjustment of non-vascular catheter: Secondary | ICD-10-CM | POA: Diagnosis not present

## 2023-07-01 DIAGNOSIS — R001 Bradycardia, unspecified: Secondary | ICD-10-CM | POA: Diagnosis not present

## 2023-07-01 DIAGNOSIS — T8082XA Complication of immune effector cellular therapy, initial encounter: Secondary | ICD-10-CM | POA: Diagnosis not present

## 2023-07-01 DIAGNOSIS — I5032 Chronic diastolic (congestive) heart failure: Secondary | ICD-10-CM | POA: Diagnosis not present

## 2023-07-01 DIAGNOSIS — I110 Hypertensive heart disease with heart failure: Secondary | ICD-10-CM | POA: Diagnosis not present

## 2023-07-01 DIAGNOSIS — C9000 Multiple myeloma not having achieved remission: Secondary | ICD-10-CM | POA: Diagnosis not present

## 2023-07-01 DIAGNOSIS — Z792 Long term (current) use of antibiotics: Secondary | ICD-10-CM | POA: Diagnosis not present

## 2023-07-01 DIAGNOSIS — Z452 Encounter for adjustment and management of vascular access device: Secondary | ICD-10-CM | POA: Diagnosis not present

## 2023-07-01 DIAGNOSIS — Z79899 Other long term (current) drug therapy: Secondary | ICD-10-CM | POA: Diagnosis not present

## 2023-07-01 DIAGNOSIS — D709 Neutropenia, unspecified: Secondary | ICD-10-CM | POA: Diagnosis not present

## 2023-07-01 DIAGNOSIS — R931 Abnormal findings on diagnostic imaging of heart and coronary circulation: Secondary | ICD-10-CM | POA: Diagnosis not present

## 2023-07-01 DIAGNOSIS — D89832 Cytokine release syndrome, grade 2: Secondary | ICD-10-CM | POA: Diagnosis not present

## 2023-07-01 DIAGNOSIS — I4891 Unspecified atrial fibrillation: Secondary | ICD-10-CM | POA: Diagnosis not present

## 2023-07-01 DIAGNOSIS — R5081 Fever presenting with conditions classified elsewhere: Secondary | ICD-10-CM | POA: Diagnosis not present

## 2023-07-01 DIAGNOSIS — Z888 Allergy status to other drugs, medicaments and biological substances status: Secondary | ICD-10-CM | POA: Diagnosis not present

## 2023-07-01 DIAGNOSIS — B957 Other staphylococcus as the cause of diseases classified elsewhere: Secondary | ICD-10-CM | POA: Diagnosis not present

## 2023-07-01 DIAGNOSIS — Z96641 Presence of right artificial hip joint: Secondary | ICD-10-CM | POA: Diagnosis not present

## 2023-07-01 DIAGNOSIS — Z92850 Personal history of chimeric antigen receptor t-cell therapy: Secondary | ICD-10-CM | POA: Diagnosis not present

## 2023-07-01 DIAGNOSIS — D61810 Antineoplastic chemotherapy induced pancytopenia: Secondary | ICD-10-CM | POA: Diagnosis not present

## 2023-07-01 DIAGNOSIS — K5900 Constipation, unspecified: Secondary | ICD-10-CM | POA: Diagnosis not present

## 2023-07-01 DIAGNOSIS — Z86718 Personal history of other venous thrombosis and embolism: Secondary | ICD-10-CM | POA: Diagnosis not present

## 2023-07-01 DIAGNOSIS — G4733 Obstructive sleep apnea (adult) (pediatric): Secondary | ICD-10-CM | POA: Diagnosis not present

## 2023-07-01 DIAGNOSIS — I440 Atrioventricular block, first degree: Secondary | ICD-10-CM | POA: Diagnosis not present

## 2023-07-02 DIAGNOSIS — Z881 Allergy status to other antibiotic agents status: Secondary | ICD-10-CM | POA: Diagnosis not present

## 2023-07-02 DIAGNOSIS — Z888 Allergy status to other drugs, medicaments and biological substances status: Secondary | ICD-10-CM | POA: Diagnosis not present

## 2023-07-02 DIAGNOSIS — Z452 Encounter for adjustment and management of vascular access device: Secondary | ICD-10-CM | POA: Diagnosis not present

## 2023-07-02 DIAGNOSIS — I440 Atrioventricular block, first degree: Secondary | ICD-10-CM | POA: Diagnosis not present

## 2023-07-02 DIAGNOSIS — Z79899 Other long term (current) drug therapy: Secondary | ICD-10-CM | POA: Diagnosis not present

## 2023-07-02 DIAGNOSIS — D89832 Cytokine release syndrome, grade 2: Secondary | ICD-10-CM | POA: Diagnosis not present

## 2023-07-02 DIAGNOSIS — R7881 Bacteremia: Secondary | ICD-10-CM | POA: Diagnosis not present

## 2023-07-02 DIAGNOSIS — K5900 Constipation, unspecified: Secondary | ICD-10-CM | POA: Diagnosis not present

## 2023-07-02 DIAGNOSIS — T451X5A Adverse effect of antineoplastic and immunosuppressive drugs, initial encounter: Secondary | ICD-10-CM | POA: Diagnosis not present

## 2023-07-02 DIAGNOSIS — Z792 Long term (current) use of antibiotics: Secondary | ICD-10-CM | POA: Diagnosis not present

## 2023-07-02 DIAGNOSIS — Z96641 Presence of right artificial hip joint: Secondary | ICD-10-CM | POA: Diagnosis not present

## 2023-07-02 DIAGNOSIS — D649 Anemia, unspecified: Secondary | ICD-10-CM | POA: Diagnosis not present

## 2023-07-02 DIAGNOSIS — Z1152 Encounter for screening for COVID-19: Secondary | ICD-10-CM | POA: Diagnosis not present

## 2023-07-02 DIAGNOSIS — I5032 Chronic diastolic (congestive) heart failure: Secondary | ICD-10-CM | POA: Diagnosis not present

## 2023-07-02 DIAGNOSIS — E876 Hypokalemia: Secondary | ICD-10-CM | POA: Diagnosis not present

## 2023-07-02 DIAGNOSIS — R5081 Fever presenting with conditions classified elsewhere: Secondary | ICD-10-CM | POA: Diagnosis not present

## 2023-07-02 DIAGNOSIS — Z7901 Long term (current) use of anticoagulants: Secondary | ICD-10-CM | POA: Diagnosis not present

## 2023-07-02 DIAGNOSIS — G4733 Obstructive sleep apnea (adult) (pediatric): Secondary | ICD-10-CM | POA: Diagnosis not present

## 2023-07-02 DIAGNOSIS — B957 Other staphylococcus as the cause of diseases classified elsewhere: Secondary | ICD-10-CM | POA: Diagnosis not present

## 2023-07-02 DIAGNOSIS — Z92850 Personal history of chimeric antigen receptor t-cell therapy: Secondary | ICD-10-CM | POA: Diagnosis not present

## 2023-07-02 DIAGNOSIS — R001 Bradycardia, unspecified: Secondary | ICD-10-CM | POA: Diagnosis not present

## 2023-07-02 DIAGNOSIS — Z86718 Personal history of other venous thrombosis and embolism: Secondary | ICD-10-CM | POA: Diagnosis not present

## 2023-07-02 DIAGNOSIS — Z4682 Encounter for fitting and adjustment of non-vascular catheter: Secondary | ICD-10-CM | POA: Diagnosis not present

## 2023-07-02 DIAGNOSIS — I110 Hypertensive heart disease with heart failure: Secondary | ICD-10-CM | POA: Diagnosis not present

## 2023-07-02 DIAGNOSIS — R918 Other nonspecific abnormal finding of lung field: Secondary | ICD-10-CM | POA: Diagnosis not present

## 2023-07-02 DIAGNOSIS — D61810 Antineoplastic chemotherapy induced pancytopenia: Secondary | ICD-10-CM | POA: Diagnosis not present

## 2023-07-02 DIAGNOSIS — D709 Neutropenia, unspecified: Secondary | ICD-10-CM | POA: Diagnosis not present

## 2023-07-02 DIAGNOSIS — I4891 Unspecified atrial fibrillation: Secondary | ICD-10-CM | POA: Diagnosis not present

## 2023-07-02 DIAGNOSIS — C9000 Multiple myeloma not having achieved remission: Secondary | ICD-10-CM | POA: Diagnosis not present

## 2023-07-02 DIAGNOSIS — R931 Abnormal findings on diagnostic imaging of heart and coronary circulation: Secondary | ICD-10-CM | POA: Diagnosis not present

## 2023-07-02 DIAGNOSIS — T8082XA Complication of immune effector cellular therapy, initial encounter: Secondary | ICD-10-CM | POA: Diagnosis not present

## 2023-07-03 DIAGNOSIS — C9000 Multiple myeloma not having achieved remission: Secondary | ICD-10-CM | POA: Diagnosis not present

## 2023-07-03 DIAGNOSIS — D709 Neutropenia, unspecified: Secondary | ICD-10-CM | POA: Diagnosis not present

## 2023-07-03 DIAGNOSIS — R5081 Fever presenting with conditions classified elsewhere: Secondary | ICD-10-CM | POA: Diagnosis not present

## 2023-07-07 DIAGNOSIS — C9000 Multiple myeloma not having achieved remission: Secondary | ICD-10-CM | POA: Diagnosis not present

## 2023-07-08 DIAGNOSIS — C9000 Multiple myeloma not having achieved remission: Secondary | ICD-10-CM | POA: Diagnosis not present

## 2023-07-09 DIAGNOSIS — C9000 Multiple myeloma not having achieved remission: Secondary | ICD-10-CM | POA: Diagnosis not present

## 2023-07-10 DIAGNOSIS — C9000 Multiple myeloma not having achieved remission: Secondary | ICD-10-CM | POA: Diagnosis not present

## 2023-07-11 DIAGNOSIS — C9000 Multiple myeloma not having achieved remission: Secondary | ICD-10-CM | POA: Diagnosis not present

## 2023-07-13 DIAGNOSIS — C9000 Multiple myeloma not having achieved remission: Secondary | ICD-10-CM | POA: Diagnosis not present

## 2023-07-15 DIAGNOSIS — C9000 Multiple myeloma not having achieved remission: Secondary | ICD-10-CM | POA: Diagnosis not present

## 2023-07-18 DIAGNOSIS — C9000 Multiple myeloma not having achieved remission: Secondary | ICD-10-CM | POA: Diagnosis not present

## 2023-07-20 DIAGNOSIS — C9000 Multiple myeloma not having achieved remission: Secondary | ICD-10-CM | POA: Diagnosis not present

## 2023-07-21 DIAGNOSIS — C9000 Multiple myeloma not having achieved remission: Secondary | ICD-10-CM | POA: Diagnosis not present

## 2023-07-22 DIAGNOSIS — C9000 Multiple myeloma not having achieved remission: Secondary | ICD-10-CM | POA: Diagnosis not present

## 2023-07-25 DIAGNOSIS — Z92850 Personal history of chimeric antigen receptor t-cell therapy: Secondary | ICD-10-CM | POA: Diagnosis not present

## 2023-07-25 DIAGNOSIS — R5381 Other malaise: Secondary | ICD-10-CM | POA: Diagnosis not present

## 2023-07-25 DIAGNOSIS — C9000 Multiple myeloma not having achieved remission: Secondary | ICD-10-CM | POA: Diagnosis not present

## 2023-07-27 DIAGNOSIS — C9000 Multiple myeloma not having achieved remission: Secondary | ICD-10-CM | POA: Diagnosis not present

## 2023-07-29 DIAGNOSIS — C9000 Multiple myeloma not having achieved remission: Secondary | ICD-10-CM | POA: Diagnosis not present

## 2023-08-01 DIAGNOSIS — C9000 Multiple myeloma not having achieved remission: Secondary | ICD-10-CM | POA: Diagnosis not present

## 2023-08-01 DIAGNOSIS — G510 Bell's palsy: Secondary | ICD-10-CM | POA: Diagnosis not present

## 2023-08-01 DIAGNOSIS — Z79899 Other long term (current) drug therapy: Secondary | ICD-10-CM | POA: Diagnosis not present

## 2023-08-01 DIAGNOSIS — Z452 Encounter for adjustment and management of vascular access device: Secondary | ICD-10-CM | POA: Diagnosis not present

## 2023-08-01 DIAGNOSIS — Z7901 Long term (current) use of anticoagulants: Secondary | ICD-10-CM | POA: Diagnosis not present

## 2023-08-02 ENCOUNTER — Other Ambulatory Visit: Payer: Self-pay | Admitting: Internal Medicine

## 2023-08-03 NOTE — Telephone Encounter (Signed)
 Prescription refill request for Eliquis received. Indication:afib Last office visit:1/25 Scr:0.7  3/25 Age: 73 Weight:121.6  kg  Prescription refilled

## 2023-08-04 DIAGNOSIS — C9000 Multiple myeloma not having achieved remission: Secondary | ICD-10-CM | POA: Diagnosis not present

## 2023-08-04 DIAGNOSIS — E876 Hypokalemia: Secondary | ICD-10-CM | POA: Diagnosis not present

## 2023-08-05 DIAGNOSIS — D369 Benign neoplasm, unspecified site: Secondary | ICD-10-CM | POA: Diagnosis not present

## 2023-08-05 DIAGNOSIS — C9000 Multiple myeloma not having achieved remission: Secondary | ICD-10-CM | POA: Diagnosis not present

## 2023-08-05 DIAGNOSIS — I480 Paroxysmal atrial fibrillation: Secondary | ICD-10-CM | POA: Diagnosis not present

## 2023-08-05 DIAGNOSIS — G4730 Sleep apnea, unspecified: Secondary | ICD-10-CM | POA: Diagnosis not present

## 2023-08-05 DIAGNOSIS — I5032 Chronic diastolic (congestive) heart failure: Secondary | ICD-10-CM | POA: Diagnosis not present

## 2023-08-05 DIAGNOSIS — R350 Frequency of micturition: Secondary | ICD-10-CM | POA: Diagnosis not present

## 2023-08-05 DIAGNOSIS — R7303 Prediabetes: Secondary | ICD-10-CM | POA: Diagnosis not present

## 2023-08-05 DIAGNOSIS — D6869 Other thrombophilia: Secondary | ICD-10-CM | POA: Diagnosis not present

## 2023-08-05 DIAGNOSIS — Z86718 Personal history of other venous thrombosis and embolism: Secondary | ICD-10-CM | POA: Diagnosis not present

## 2023-08-05 DIAGNOSIS — I10 Essential (primary) hypertension: Secondary | ICD-10-CM | POA: Diagnosis not present

## 2023-08-05 DIAGNOSIS — D649 Anemia, unspecified: Secondary | ICD-10-CM | POA: Diagnosis not present

## 2023-08-08 ENCOUNTER — Encounter: Payer: Self-pay | Admitting: Neurology

## 2023-08-08 ENCOUNTER — Ambulatory Visit: Payer: Medicare Other | Admitting: Neurology

## 2023-08-08 VITALS — BP 122/68 | HR 64 | Ht 70.0 in | Wt 258.0 lb

## 2023-08-08 DIAGNOSIS — G519 Disorder of facial nerve, unspecified: Secondary | ICD-10-CM | POA: Diagnosis not present

## 2023-08-08 DIAGNOSIS — C9000 Multiple myeloma not having achieved remission: Secondary | ICD-10-CM

## 2023-08-08 DIAGNOSIS — G63 Polyneuropathy in diseases classified elsewhere: Secondary | ICD-10-CM | POA: Diagnosis not present

## 2023-08-08 NOTE — Progress Notes (Signed)
 Follow-up Visit   Date: 08/08/23   Travis [name] MRN: [id number] DOB: Jan 22, 1951   Interim History: Travis [name] is a 73 y.o. with history of atrial fibrillation s/p ablation (on eliquis), OSA on CPAP, GERD, multiple myeloma, alcohol use, and hypertension returning to the clinic for follow-up of neuropathy.  The patient was accompanied to the clinic by self.   IMPRESSION/PLAN: 1.  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  Stable   - Pain is controlled on gabapentin 600mg  BID  - Unfortunately, there is no treatment for numbness  - Fall precautions discussed  - He will be starting PT for leg strengthening   2.  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 Franky Macho  Return to clinic in 1 year  ------------------------------------------------------------------- UPDATE 08/10/2022:  He is here for follow-up visit. Patient was doing well until July 2023, when he developed right arm pain and found to have lytic lesion involving the right humerus.  He was also found to have rib lesions and in December plasmacytoma at T2 causing cord compression s/p decompression.  He has since been on chemotherapy and radiation.  His neuropathy is worse such that he has more numbness/tingling in the feet.  He denies pain in the feet.  He remains on gabapentin 600mg  twice daily.  He uses a walker for balance.   He has an upcoming trip to [place] next month and [place] later this year.    He has also been having a lot of swelling in the legs.  He is taking lasix 80mg  daily and wears compression socks.   UPDATE 08/08/2023:  He is here for follow-up visit.  He started CAR-T therapy for multiple myeloma at [facility].  A few weeks ago, he developed right facial weakness and started prednisone.  He has been very tired and legs feel fatigue.  He was told these are side effects from  CAR-T therapy.  He was offered prednisone for his facial weakness, but has not noticed any improvement yet.   Medications:  Current Outpatient Medications on File Prior to Visit  Medication Sig Dispense Refill   albuterol (VENTOLIN HFA) 108 (90 Base) MCG/ACT inhaler Inhale 2 puffs into the lungs every 6 (six) hours as needed for wheezing or shortness of breath. 8 g 0   amiodarone (PACERONE) 200 MG tablet Take 200 mg by mouth daily.     calcium carbonate (TUMS - DOSED IN MG ELEMENTAL CALCIUM) 500 MG chewable tablet Chew 1 tablet by mouth daily.     cholecalciferol (VITAMIN D3) 25 MCG (1000 UNIT) tablet Take 1,000 Units by mouth daily.     Cyanocobalamin (VITAMIN B-12 PO) Take 1,000 mcg by mouth daily.     ELIQUIS 5 MG TABS tablet TAKE ONE TABLET BY MOUTH TWICE A DAY 60 tablet 6   fluticasone (FLONASE) 50 MCG/ACT nasal spray Place 1 spray into both nostrils daily as needed for allergies or rhinitis.     gabapentin (NEURONTIN) 300 MG capsule TAKE TWO CAPSULES BY MOUTH TWICE A DAY 360 capsule 1   hydrALAZINE (APRESOLINE) 100 MG tablet Take 1 tablet (100 mg total) by mouth 3 (three) times daily. 270 tablet 3   ketoconazole (NIZORAL) 2 % cream as needed for irritation.     levETIRAcetam (KEPPRA) 500 MG tablet Take 500 mg by mouth 2 (two) times daily.     metolazone (ZAROXOLYN) 2.5  MG tablet Take 1 tablet (2.5 mg total) by mouth every other day. (Patient taking differently: Take 2.5 mg by mouth 3 times/day as needed-between meals & bedtime (edema). Taking 1/2 ever other day or every three days)     polyethylene glycol powder (GLYCOLAX/MIRALAX) 17 GM/SCOOP powder Take 1 g by mouth daily.     potassium chloride SA (KLOR-CON M) 20 MEQ tablet TAKE TWO TABLETS BY MOUTH ONE TIME DAILY - TAKE AN EXTRA TABLET ON THE DAY YOU TAKE THE METOLAZONE ( TOTAL) (Patient taking differently: Take 40 mEq by mouth 3 (three) times daily.) 225 tablet 3   psyllium (METAMUCIL) 58.6 % powder Take by mouth at bedtime. 3 tsp  daily hs     sacubitril-valsartan (ENTRESTO) 49-51 MG Take 1 tablet by mouth 2 (two) times daily.     sodium chloride (OCEAN) 0.65 % SOLN nasal spray Place 1 spray into both nostrils as needed for congestion.     spironolactone (ALDACTONE) 25 MG tablet Take 1 tablet (25 mg total) by mouth daily.     sulfamethoxazole-trimethoprim (BACTRIM DS) 800-160 MG tablet Take 1 tablet by mouth 3 (three) times a week.     torsemide (DEMADEX) 20 MG tablet Take 2 tablets (40 mg total) by mouth daily. 90 tablet 3   traMADol (ULTRAM) 50 MG tablet Take 1 tablet (50 mg total) by mouth every 6 (six) hours as needed. 30 tablet 0   No current facility-administered medications on file prior to visit.    Allergies:  Allergies  Allergen Reactions   Octagam [Immune Globulin] Shortness Of Breath and Other (See Comments)    Patient developed hip/back pain, SOB, rigors- see notes 05/13/23   Linezolid Other (See Comments)    Burning tongue    Vancomycin Rash    Other Reaction(s): rash, itching, fever, chills    Vital Signs:  BP 122/68   Pulse 64   Ht 5\' 10"  (1.778 m)   Wt 258 lb (117 kg)   SpO2 94%   BMI 37.02 kg/m    Neurological Exam: MENTAL STATUS including orientation to time, place, person, recent and remote memory, attention span and concentration, language, and fund of knowledge is normal.  Speech is not dysarthric.  CRANIAL NERVES:   Pupils equal round and reactive.  Normal conjugate, extra-ocular eye movements in all directions of gaze.  Moderate right facial asymmetry with LMN pattern.  There is asymmetric right eyelid lag.   MOTOR:  Motor strength is 5/5 in all extremities, including distally in the feet.  No atrophy, fasciculations or abnormal movements.  No pronator drift.  Tone is normal.    MSRs:                                           Right        Left brachioradialis 3+  3+  biceps 2+  2+  triceps 2+  2+  patellar 2+  2+  ankle jerk 0  0   SENSORY:  Vibration absent at the ankles  bilaterally, intact at the knees and MCP.  COORDINATION/GAIT:  Gait is wide-based, assisted with rollator.  Data: Lab Results  Component Value Date   VITAMINB12 256 06/08/2022   Lab Results  Component Value Date   FOLATE 24.4 08/18/2022   MRI lumbar spine wwo contrast 10/12/2017: 1. Lumbar spondylosis and degenerative disc disease causing mild  impingement at L4-5  and L5-S1.  2. There is some marrow heterogeneity but no focal enhancing lesion  characteristic of lumbar myeloma is identified.  3. Mild levoconvex lumbar scoliosis.   MRI lumbar spine wo contrast 04/23/2020: 1. No acute abnormality or significant change from prior MRI. 2. Mild multilevel degenerative changes of the lumbar spine with mild narrowing of the bilateral subarticular zones at L4-5. 3. No significant spinal canal or neural foraminal stenosis at any Level.  MRI cervical spine 03/06/2020: Left foraminal narrowing C2-3 and C3-4   Moderate spinal stenosis C4-5. Moderate foraminal stenosis bilaterally right greater than left due to spurring   Mild spinal stenosis C5-6 with mild foraminal stenosis bilaterally.  NCS/EMG of the arms 04/04/2020: Chronic C5 radiculopathy affecting bilateral upper extremities, mild. 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      Thank you for allowing me to participate in patient's care.  If I can answer any additional questions, I would be pleased to do so.    Sincerely,    Alexah Kivett K. Allena Katz, DO

## 2023-08-09 ENCOUNTER — Ambulatory Visit: Admitting: Physical Therapy

## 2023-08-09 ENCOUNTER — Encounter: Payer: Self-pay | Admitting: Physical Therapy

## 2023-08-09 ENCOUNTER — Other Ambulatory Visit: Payer: Self-pay

## 2023-08-09 DIAGNOSIS — M25552 Pain in left hip: Secondary | ICD-10-CM | POA: Diagnosis not present

## 2023-08-09 DIAGNOSIS — S22000A Wedge compression fracture of unspecified thoracic vertebra, initial encounter for closed fracture: Secondary | ICD-10-CM | POA: Insufficient documentation

## 2023-08-09 DIAGNOSIS — R252 Cramp and spasm: Secondary | ICD-10-CM | POA: Diagnosis not present

## 2023-08-09 DIAGNOSIS — C9000 Multiple myeloma not having achieved remission: Secondary | ICD-10-CM | POA: Diagnosis not present

## 2023-08-09 DIAGNOSIS — M546 Pain in thoracic spine: Secondary | ICD-10-CM | POA: Insufficient documentation

## 2023-08-09 DIAGNOSIS — R293 Abnormal posture: Secondary | ICD-10-CM | POA: Diagnosis not present

## 2023-08-09 DIAGNOSIS — R262 Difficulty in walking, not elsewhere classified: Secondary | ICD-10-CM | POA: Insufficient documentation

## 2023-08-09 DIAGNOSIS — M542 Cervicalgia: Secondary | ICD-10-CM | POA: Insufficient documentation

## 2023-08-09 DIAGNOSIS — M5459 Other low back pain: Secondary | ICD-10-CM | POA: Diagnosis not present

## 2023-08-09 DIAGNOSIS — M6281 Muscle weakness (generalized): Secondary | ICD-10-CM | POA: Insufficient documentation

## 2023-08-09 NOTE — Therapy (Unsigned)
 OUTPATIENT PHYSICAL THERAPY ORTHOPEDIC EVALUATION   Patient Name: Travis [name] MRN: [id number] DOB:[birth year], 73 y.o., male Today's Date: 08/09/2023  END OF SESSION:  PT End of Session - 08/09/23 1633     Visit Number 1    Date for PT Re-Evaluation 10/04/23    Authorization Type [employer] MCR    Progress Note Due on Visit 10    PT Start Time 1320    PT Stop Time 1405    PT Time Calculation (min) 45 min    Activity Tolerance Patient tolerated treatment well    Behavior During Therapy WFL for tasks assessed/performed             Past Medical History:  Diagnosis Date   A-fib (HCC)    Arthritis    "comes and goes; mostly in hands, occasionally elbow" (04/05/2017)   Complication of anesthesia    "just wanted to sleep alot  for couple days S/P VARICOCELE OR" (04/05/2017)   DVT (deep venous thrombosis) (HCC)    LLE   Dysrhythmia    PVCs    GERD (gastroesophageal reflux disease)    History of hiatal hernia    History of radiation therapy    Thoracic Spine- 07/13/22-07/23/22-Dr. Antony Blackbird   Hypertension    Impaired glucose tolerance    Multiple myeloma (HCC)    Obesity (BMI 30-39.9) 07/26/2016   OSA on CPAP 07/26/2016   Mild with AHI 12.5/hr now on CPAP at 14cm H2O   PAF (paroxysmal atrial fibrillation) (HCC)    s/p PVI Ablation in 2018 // Flecainide Rx // Echo 8/21: EF 60-65, no RWMA, mild LVH, normal RV SF, moderate RVE, mild BAE, trivial MR, mild dilation of aortic root (40 mm)   Pneumonia    "may have had walking pneumonia a few years ago" (04/05/2017)   Pre-diabetes    Thrombophlebitis    Past Surgical History:  Procedure Laterality Date   ATRIAL FIBRILLATION ABLATION  04/05/2017   ATRIAL FIBRILLATION ABLATION N/A 04/05/2017   Procedure: ATRIAL FIBRILLATION ABLATION;  Surgeon: [name], MD;  Location: [facility];  Service: Cardiovascular;  Laterality: N/A;   BONE BIOPSY Right 12/17/2021   Procedure: BONE BIOPSY;  Surgeon: [name], MD;  Location:  [facility];  Service: Orthopedics;  Laterality: Right;   COMPLETE RIGHT HIP REPLACMENT  01/19/2019   EVALUATION UNDER ANESTHESIA WITH HEMORRHOIDECTOMY N/A 02/24/2017   Procedure: EXAM UNDER ANESTHESIA WITH  HEMORRHOIDECTOMY;  Surgeon: [name], MD;  Location: [facility];  Service: General;  Laterality: N/A;   EXCISIONAL TOTAL HIP ARTHROPLASTY WITH ANTIBIOTIC SPACERS Right 09/25/2020   Procedure: EXCISIONAL TOTAL HIP ARTHROPLASTY WITH ANTIBIOTIC SPACERS;  Surgeon: [name], MD;  Location: [facility];  Service: Orthopedics;  Laterality: Right;   FLEXIBLE SIGMOIDOSCOPY N/A 04/15/2015   Procedure:  UNSEDATED FLEXIBLE SIGMOIDOSCOPY;  Surgeon: [name], MD;  Location: [facility];  Service: Endoscopy;  Laterality: N/A;   HUMERUS IM NAIL Right 12/17/2021   Procedure: INTRAMEDULLARY (IM) NAIL HUMERAL;  Surgeon: [name], MD;  Location:  SURGERY CENTER;  Service: Orthopedics;  Laterality: Right;   KNEE ARTHROSCOPY Left ~ 2016   POSTERIOR CERVICAL FUSION/FORAMINOTOMY N/A 06/04/2022   Procedure: Thoracic one - Thoracic Four Posterior lateral arthrodesis/ fusion and Thoracic two Corpectomy;  Surgeon: [name], MD;  Location: [facility];  Service: Neurosurgery;  Laterality: N/A;   REIMPLANTATION OF TOTAL HIP Right 12/25/2020   Procedure: REIMPLANTATION/REVISION OF RIGHT TOTAL HIP VERSUS REPEAT IRRIGATION AND DEBRIDEMENT;  Surgeon: [name], MD;  Location: [facility];  Service: Orthopedics;  Laterality: Right;   TESTICLE SURGERY  1988   VARICOCELE   TONSILLECTOMY     VARICOSE VEIN SURGERY Bilateral    Patient Active Problem List   Diagnosis Date Noted   Pancreatic cyst 09/30/2022   Infection due to novel influenza A virus 09/29/2022   Persistent atrial fibrillation (HCC) 07/15/2022   Acute on chronic diastolic heart failure (HCC) 07/03/2022   Pulmonary infiltrates 07/01/2022   Acute respiratory failure with hypoxia (HCC) 07/01/2022   Hyponatremia 06/29/2022    History of thoracic surgery 06/29/2022   Acute DVT of left tibial vein (HCC) 06/21/2022   Hypokalemia 06/21/2022   Vitamin D deficiency 05/30/2022   Other constipation 05/29/2022   Pathologic compression fracture of spine, initial encounter (HCC) 05/29/2022   Cord compression (HCC) 05/28/2022   Degenerative cervical disc 05/14/2022   Hypercoagulable state due to persistent atrial fibrillation (HCC) 01/11/2022   Multiple myeloma (HCC) 12/10/2021   Medication monitoring encounter 02/27/2021   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   Status post peripherally inserted central catheter (PICC) central line placement 11/06/2020   Drug rash with eosinophilia and systemic symptoms syndrome 11/06/2020   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   PAF (paroxysmal atrial fibrillation) (HCC)    GERD (gastroesophageal reflux disease) 03/29/2018   Hiatal hernia 03/29/2018   History of DVT (deep vein thrombosis) 03/29/2018   CAP (community acquired pneumonia) 03/29/2018   Osteoarthritis 03/29/2018   Neuropathy 08/29/2017   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   Paroxysmal atrial fibrillation with rapid ventricular response (HCC) 04/05/2017   Prolapsed internal hemorrhoids, grade 4, s/p ligation/pexy/hemorrhoidectomy x 2 02/24/2017 02/24/2017   OSA (obstructive sleep apnea) 07/26/2016   Obesity (BMI 30-39.9) 07/26/2016   Atrial fibrillation with RVR (HCC)    Essential hypertension    Chest pain at rest     PCP: Eleanora Neighbor, MD  REFERRING PROVIDER: Clydene Laming, NP  REFERRING DIAG: R53.81 (ICD-10-CM) - Physical deconditioning C90.00 (ICD-10-CM) - Multiple myeloma myelomatosis (HCC)  Rationale for Evaluation and Treatment: Rehabilitation  THERAPY DIAG:  Muscle weakness (generalized)  Difficulty in walking, not elsewhere classified  Abnormal posture  Pain in thoracic spine  ONSET DATE: chronic  SUBJECTIVE:                                                                                                                                                                                            SUBJECTIVE STATEMENT: Pt returns to clinic for strengthening and conditioning following 5 weeks of inpatient cancer treatment.  Pt has multiple myeloma with bone  mets to Rt ribs and skull.  He has history of pathological fractures of Rt humerus and T2.  He has fusion of T1-T4 and Rt THA from 2020 with revision in 2022.  He has neuropathy of bil LE. I have a lot of weakness in the legs and my back is still stiff from the spine surgery I had (thoracic).  Arrives with [facility] but has a rollator too.  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 Yes Pain location: bil knees, Rt hip, upper back, neuropathy in Lt foot > Rt PAIN TYPE: aching, burning, and numbness (foot) Pain description: intermittent and constant  Aggravating factors: exercise, walking - makes me sore Relieving factors: the band exercises help my back   PRECAUTIONS: Other: NO HEAVY RESISTANCE OR WEIGHT LIFTING GIVEN CANCER WITH BONE METS, FALL RISK  RED FLAGS: Cancer with bone mets, fall risk    WEIGHT BEARING RESTRICTIONS: No  FALLS:  Has patient fallen in last 6 months? Yes. Number of falls 0  LIVING ENVIRONMENT: Lives with: lives with their family and lives with their spouse Lives in: House/apartment Stairs: Yes; Internal: 14 steps; on left  going up and External: 3 steps; on right going up Has following equipment at home: Single point cane and Walker - 4 wheeled - rollator   OCCUPATION: semi-retired, attorney   PLOF: Independent with household mobility with device   PATIENT GOALS: LE strength, loosen up back  NEXT MD VISIT: tomorrow  OBJECTIVE:  Note: Objective measures were completed at Evaluation unless otherwise noted.   PATIENT SURVEYS:  Objective measures for fall risk and deconditioning diagnosis - see functional tests  COGNITION: Overall cognitive status: Within functional limits for tasks assessed     SENSATION: Whole foot Rt>Lt numbness related to neuropathy   POSTURE: rounded shoulders, forward head, decreased lumbar lordosis, and increased thoracic kyphosis   TRUNK ROM:    Flexes trunk with fingers to mid-shin with knees bent and holds onto counter Trunk rotation bil 50% Neck rotation bil 50% Neck extension - remains flexed to 5 deg  LOWER EXTREMITY MMT:   Rt hip 4-/5 Lt hip 4/5 Bil knees 4-/5   FUNCTIONAL TESTS:  5 times sit to stand: 19.14 heavy use of UE on chair to rise and control descent Timed up and go (TUG): 18.28 with SPC and use of hands on chair  3 minute walk test: 64' with SCP 10 meter walk test: 12.62 sec with SPC  GAIT: Distance walked: 401 Assistive device utilized: Single point cane Level of assistance: Modified independence Comments: lacks trunk rotation, wide BOS  TREATMENT DATE:  08/09/23: Initiated HEP (see below - added LE strength to already existing program from previous episode of care this year which is more focused on stretching and ROM)  PATIENT EDUCATION:  Education details: [email or web address] Person educated: Patient Education method: Programmer, multimedia, Facilities manager, Verbal cues, and Handouts Education comprehension: verbalized  understanding and returned demonstration  HOME EXERCISE PROGRAM: Access Code: [email or web address] URL: [email or web address] Date: 08/09/2023 Prepared by: Loistine Simas Leba Tibbitts  Exercises - Seated Cervical Rotation AROM  - 1 x daily - 7 x weekly - 1 sets - 10 reps - 10 hold - Seated Cervical Sidebending AROM  - 1 x daily - 7 x weekly - 1 sets - 10 reps - 5 hold - Seated Cervical Extension AROM  - 1 x daily - 7 x weekly - 1 sets - 10 reps - 10 hold - Seated Scapular Retraction  - 1 x daily - 7 x weekly - 1-3 sets - 10 reps - 2-3 sec hold - Sidelying Thoracic Rotation with Open Book  - 1 x daily - 7 x weekly - 2 sets - 5 reps - Seated Thoracic Extension Arms Overhead  - 1 x daily - 7 x weekly - 1-2 sets - 10 reps - 2-3 sec hold - Seated Trunk Rotation  - 1 x daily - 7 x weekly - 1-2 sets - 10 reps - 2-3 sec hold - Seated Diagonal Chops with Medicine Ball  - 1 x daily - 7 x weekly - 1-2 sets - 10 reps - 2-3 hold - Seated Long Arc Quad  - 1 x daily - 7 x weekly - 3 sets - 10 reps - Seated March  - 1 x daily - 7 x weekly - 3 sets - 10 reps - Seated Hip External Rotation AROM  - 1 x daily - 7 x weekly - 3 sets - 10 reps - Supine Hamstring Stretch with Strap  - 1 x daily - 7 x weekly - 1 sets - 3 reps - 20 sec hold - Supine ITB Stretch with Strap  - 1 x daily - 7 x weekly - 1 sets - 3 reps - 20 sec hold - Seated Piriformis Stretch  - 1 x daily - 7 x weekly - 1 sets - 3 reps - 30 sec hold - Seated Hip Adduction Squeeze with Ball  - 1 x daily - 7 x weekly - 3 sets - 10 reps - 2-3 sec hold - Seated 3 Way Exercise [employer] Out Stretch  - 1 x daily - 7 x weekly - 1 sets - 10 reps - Seated Long Arc Quad with Ankle Weight  - 1 x daily - 7 x weekly - 2 sets - 10 reps - Seated Hip Flexion March with Ankle Weights  - 1 x daily - 7 x weekly - 2 sets - 10 reps - Seated Hip External Rotation AROM  - 1 x daily - 7 x weekly - 2 sets - 10 reps - Seated Heel Toe Raises  - 1 x daily - 7 x weekly - 2 sets - 10  reps - Standing Hip Abduction with Counter Support  - 1 x daily - 7 x weekly - 2 sets - 10 reps - Standing Hip Extension with Counter Support  - 1 x daily - 7 x weekly - 2 sets - 10 reps - Heel Raises with Counter Support  - 1 x daily - 7 x weekly - 2 sets - 10 reps - Sit to Stand with Armchair  - 5 x daily - 7 x weekly - 1 sets - 3-5 [id number]  ASSESSMENT:  CLINICAL IMPRESSION: Patient is a 73 y.o. male who was seen today  for physical therapy evaluation and treatment for weakness and deconditioning.  Pt was treated at this facility previously this year prior to undergoing inpatient cancer treatment at [facility] for 5-6 weeks.  He has been home from treatment for about a week now.  He states he did a lot of walking while there to try to keep up his mobility and endurance.  Pt has multiple myeloma with bone mets to Rt ribs and skull.  Pt has had pathological fractures to Rt humerus and T2 resulting in surgeries (including T1-T4 fusion).  He ambulated ind today with a SPC and was able to participate in multiple objective tests including a .  He demos stable gait with AD, poor neck and trunk ROM, limited flexibility of LE, and moderate weakness of LE.  He has numbness of legs and feet related to neuropathy.  Pt will benefit from skilled PT to improve strength, balance, mobility and endurance following his hospital stay to improve overall independence, QOL and safety.   OBJECTIVE IMPAIRMENTS: Abnormal gait, cardiopulmonary status limiting activity, decreased activity tolerance, decreased balance, decreased coordination, decreased endurance, decreased mobility, difficulty walking, decreased ROM, decreased strength, hypomobility, increased edema, impaired flexibility, impaired sensation, impaired UE functional use, improper body mechanics, postural dysfunction, and pain.   ACTIVITY LIMITATIONS: carrying, lifting, bending, sitting, standing, squatting, sleeping, stairs, transfers, bed mobility,  bathing, toileting, dressing, hygiene/grooming, and locomotion level  PARTICIPATION LIMITATIONS: meal prep, cleaning, laundry, shopping, and community activity  PERSONAL FACTORS: Age, Time since onset of injury/illness/exacerbation, and 3+ comorbidities: a-fib, history of pathological fractures, fall risk, bone mets, neuropathy of bil LE  are also affecting patient's functional outcome.   REHAB POTENTIAL: Good  CLINICAL DECISION MAKING: Evolving/moderate complexity  EVALUATION COMPLEXITY: Moderate   GOALS: Goals reviewed with patient? Yes  SHORT TERM GOALS: Target date: 09/06/23  Pt will be safe and ind with initial HEP Baseline: Goal status: INITIAL  2.  Pt will be able to perform 5-10 reps of sit to stand without use of UE on thighs with controlled descent into chair Baseline:  Goal status: INITIAL  3.  Pt will report reduced thoracic spine pain by at least 25% Baseline:  Goal status: INITIAL  4.  Pt will improve TUG test to 16 sec or less using SPC Baseline: 18.28 Goal status: INITIAL    LONG TERM GOALS: Target date: 10/04/23  Pt will demo safety and ind with HEP and understand the importance of ongoing compliance upon d/c from PT Baseline:  Goal status: INITIAL  2.  Pt will report reduced thoracic spine pain by at least 50%. Baseline:  Goal status: INITIAL  3.  Pt will cover at least 800' with [facility] in a to improve endurance for community outings. Baseline: 57' in with SPC Goal status: INITIAL  4.  Pt will improve mobility to allow him to safely reach for object on floor using light UE support Baseline:  Goal status: INITIAL  5.  Pt will improve speed with 10 meter walk test to 11.5 sec Baseline: 12.62 Goal status: INITIAL  6.  Pt will improve 5X STS to 16 sec or less without use of UE on chair to demo improved functional strength and reduced fall risk. Baseline:  Goal status: INITIAL  PLAN:  PT FREQUENCY: 2x/week  PT DURATION: 8  weeks  PLANNED INTERVENTIONS: 97110-Therapeutic exercises, 97530- Therapeutic activity, 97112- Neuromuscular re-education, 97535- Self Care, 82956- Manual therapy, [phone]- Gait training, Patient/Family education, Balance training, Stair training, Dry Needling, Joint mobilization, Spinal mobilization,  Cryotherapy, and Moist heat.  PLAN FOR NEXT SESSION: Nustep, review HEP (PT added LE strength to already existing HEP from last episode of care), gait endurance, sit to stand, HS flexibility, functional body weight strength and trunk mobility - no heavy lifting due to bone mets   Lesa Vandall, PT 08/09/23 4:43 PM

## 2023-08-10 DIAGNOSIS — C9000 Multiple myeloma not having achieved remission: Secondary | ICD-10-CM | POA: Diagnosis not present

## 2023-08-12 DIAGNOSIS — I4819 Other persistent atrial fibrillation: Secondary | ICD-10-CM | POA: Diagnosis not present

## 2023-08-12 DIAGNOSIS — Z86718 Personal history of other venous thrombosis and embolism: Secondary | ICD-10-CM | POA: Diagnosis not present

## 2023-08-12 DIAGNOSIS — N182 Chronic kidney disease, stage 2 (mild): Secondary | ICD-10-CM | POA: Diagnosis not present

## 2023-08-12 DIAGNOSIS — Z92850 Personal history of chimeric antigen receptor t-cell therapy: Secondary | ICD-10-CM | POA: Diagnosis not present

## 2023-08-12 DIAGNOSIS — C9000 Multiple myeloma not having achieved remission: Secondary | ICD-10-CM | POA: Diagnosis not present

## 2023-08-12 DIAGNOSIS — I130 Hypertensive heart and chronic kidney disease with heart failure and stage 1 through stage 4 chronic kidney disease, or unspecified chronic kidney disease: Secondary | ICD-10-CM | POA: Diagnosis not present

## 2023-08-12 DIAGNOSIS — Z7901 Long term (current) use of anticoagulants: Secondary | ICD-10-CM | POA: Diagnosis not present

## 2023-08-12 DIAGNOSIS — M47816 Spondylosis without myelopathy or radiculopathy, lumbar region: Secondary | ICD-10-CM | POA: Diagnosis not present

## 2023-08-12 DIAGNOSIS — K219 Gastro-esophageal reflux disease without esophagitis: Secondary | ICD-10-CM | POA: Diagnosis not present

## 2023-08-12 DIAGNOSIS — M51379 Other intervertebral disc degeneration, lumbosacral region without mention of lumbar back pain or lower extremity pain: Secondary | ICD-10-CM | POA: Diagnosis not present

## 2023-08-12 DIAGNOSIS — D472 Monoclonal gammopathy: Secondary | ICD-10-CM | POA: Diagnosis not present

## 2023-08-16 ENCOUNTER — Ambulatory Visit

## 2023-08-16 DIAGNOSIS — M5459 Other low back pain: Secondary | ICD-10-CM

## 2023-08-16 DIAGNOSIS — M546 Pain in thoracic spine: Secondary | ICD-10-CM

## 2023-08-16 DIAGNOSIS — M6281 Muscle weakness (generalized): Secondary | ICD-10-CM | POA: Diagnosis not present

## 2023-08-16 DIAGNOSIS — R293 Abnormal posture: Secondary | ICD-10-CM | POA: Diagnosis not present

## 2023-08-16 DIAGNOSIS — S22000A Wedge compression fracture of unspecified thoracic vertebra, initial encounter for closed fracture: Secondary | ICD-10-CM

## 2023-08-16 DIAGNOSIS — R262 Difficulty in walking, not elsewhere classified: Secondary | ICD-10-CM | POA: Diagnosis not present

## 2023-08-16 DIAGNOSIS — M25552 Pain in left hip: Secondary | ICD-10-CM | POA: Diagnosis not present

## 2023-08-16 DIAGNOSIS — M542 Cervicalgia: Secondary | ICD-10-CM | POA: Diagnosis not present

## 2023-08-16 DIAGNOSIS — C9000 Multiple myeloma not having achieved remission: Secondary | ICD-10-CM

## 2023-08-16 DIAGNOSIS — R252 Cramp and spasm: Secondary | ICD-10-CM | POA: Diagnosis not present

## 2023-08-16 NOTE — Therapy (Signed)
 OUTPATIENT PHYSICAL THERAPY ORTHOPEDIC TREATMENT   Patient Name: Travis [name] MRN: [id number] DOB:[birth year], 73 y.o., male Today's Date: 08/16/2023  END OF SESSION:  PT End of Session - 08/16/23 1156     Visit Number 2    Date for PT Re-Evaluation 10/04/23    Authorization Type [employer] MCR    Progress Note Due on Visit 10    PT Start Time 1152    PT Stop Time 1227    PT Time Calculation (min) 35 min    Activity Tolerance Patient tolerated treatment well    Behavior During Therapy WFL for tasks assessed/performed             Past Medical History:  Diagnosis Date   A-fib (HCC)    Arthritis    "comes and goes; mostly in hands, occasionally elbow" (04/05/2017)   Complication of anesthesia    "just wanted to sleep alot  for couple days S/P VARICOCELE OR" (04/05/2017)   DVT (deep venous thrombosis) (HCC)    LLE   Dysrhythmia    PVCs    GERD (gastroesophageal reflux disease)    History of hiatal hernia    History of radiation therapy    Thoracic Spine- 07/13/22-07/23/22-Dr. Antony Blackbird   Hypertension    Impaired glucose tolerance    Multiple myeloma (HCC)    Obesity (BMI 30-39.9) 07/26/2016   OSA on CPAP 07/26/2016   Mild with AHI 12.5/hr now on CPAP at 14cm H2O   PAF (paroxysmal atrial fibrillation) (HCC)    s/p PVI Ablation in 2018 // Flecainide Rx // Echo 8/21: EF 60-65, no RWMA, mild LVH, normal RV SF, moderate RVE, mild BAE, trivial MR, mild dilation of aortic root (40 mm)   Pneumonia    "may have had walking pneumonia a few years ago" (04/05/2017)   Pre-diabetes    Thrombophlebitis    Past Surgical History:  Procedure Laterality Date   ATRIAL FIBRILLATION ABLATION  04/05/2017   ATRIAL FIBRILLATION ABLATION N/A 04/05/2017   Procedure: ATRIAL FIBRILLATION ABLATION;  Surgeon: [name], MD;  Location: [facility];  Service: Cardiovascular;  Laterality: N/A;   BONE BIOPSY Right 12/17/2021   Procedure: BONE BIOPSY;  Surgeon: [name], MD;  Location:  [facility];  Service: Orthopedics;  Laterality: Right;   COMPLETE RIGHT HIP REPLACMENT  01/19/2019   EVALUATION UNDER ANESTHESIA WITH HEMORRHOIDECTOMY N/A 02/24/2017   Procedure: EXAM UNDER ANESTHESIA WITH  HEMORRHOIDECTOMY;  Surgeon: [name], MD;  Location: [facility];  Service: General;  Laterality: N/A;   EXCISIONAL TOTAL HIP ARTHROPLASTY WITH ANTIBIOTIC SPACERS Right 09/25/2020   Procedure: EXCISIONAL TOTAL HIP ARTHROPLASTY WITH ANTIBIOTIC SPACERS;  Surgeon: [name], MD;  Location: [facility];  Service: Orthopedics;  Laterality: Right;   FLEXIBLE SIGMOIDOSCOPY N/A 04/15/2015   Procedure:  UNSEDATED FLEXIBLE SIGMOIDOSCOPY;  Surgeon: [name], MD;  Location: [facility];  Service: Endoscopy;  Laterality: N/A;   HUMERUS IM NAIL Right 12/17/2021   Procedure: INTRAMEDULLARY (IM) NAIL HUMERAL;  Surgeon: [name], MD;  Location: [facility];  Service: Orthopedics;  Laterality: Right;   KNEE ARTHROSCOPY Left ~ 2016   POSTERIOR CERVICAL FUSION/FORAMINOTOMY N/A 06/04/2022   Procedure: Thoracic one - Thoracic Four Posterior lateral arthrodesis/ fusion and Thoracic two Corpectomy;  Surgeon: [name], MD;  Location: [facility];  Service: Neurosurgery;  Laterality: N/A;   REIMPLANTATION OF TOTAL HIP Right 12/25/2020   Procedure: REIMPLANTATION/REVISION OF RIGHT TOTAL HIP VERSUS REPEAT IRRIGATION AND DEBRIDEMENT;  Surgeon: Claiborne Crew, MD;  Location: [facility];  Service: Orthopedics;  Laterality: Right;   TESTICLE SURGERY  1988   VARICOCELE   TONSILLECTOMY     VARICOSE VEIN SURGERY Bilateral    Patient Active Problem List   Diagnosis Date Noted   Pancreatic cyst 09/30/2022   Infection due to novel influenza A virus 09/29/2022   Persistent atrial fibrillation (HCC) 07/15/2022   Acute on chronic diastolic heart failure (HCC) 07/03/2022   Pulmonary infiltrates 07/01/2022   Acute respiratory failure with hypoxia (HCC) 07/01/2022   Hyponatremia 06/29/2022    History of thoracic surgery 06/29/2022   Acute DVT of left tibial vein (HCC) 06/21/2022   Hypokalemia 06/21/2022   Vitamin D deficiency 05/30/2022   Other constipation 05/29/2022   Pathologic compression fracture of spine, initial encounter (HCC) 05/29/2022   Cord compression (HCC) 05/28/2022   Degenerative cervical disc 05/14/2022   Hypercoagulable state due to persistent atrial fibrillation (HCC) 01/11/2022   Multiple myeloma (HCC) 12/10/2021   Medication monitoring encounter 02/27/2021   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   Status post peripherally inserted central catheter (PICC) central line placement 11/06/2020   Drug rash with eosinophilia and systemic symptoms syndrome 11/06/2020   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   PAF (paroxysmal atrial fibrillation) (HCC)    GERD (gastroesophageal reflux disease) 03/29/2018   Hiatal hernia 03/29/2018   History of DVT (deep vein thrombosis) 03/29/2018   CAP (community acquired pneumonia) 03/29/2018   Osteoarthritis 03/29/2018   Neuropathy 08/29/2017   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   Paroxysmal atrial fibrillation with rapid ventricular response (HCC) 04/05/2017   Prolapsed internal hemorrhoids, grade 4, s/p ligation/pexy/hemorrhoidectomy x 2 02/24/2017 02/24/2017   OSA (obstructive sleep apnea) 07/26/2016   Obesity (BMI 30-39.9) 07/26/2016   Atrial fibrillation with RVR (HCC)    Essential hypertension    Chest pain at rest     PCP: Charlene Conners, MD  REFERRING PROVIDER: Gery Kubas, NP  REFERRING DIAG: R53.81 (ICD-10-CM) - Physical deconditioning C90.00 (ICD-10-CM) - Multiple myeloma myelomatosis (HCC)  Rationale for Evaluation and Treatment: Rehabilitation  THERAPY DIAG:  Muscle weakness (generalized)  Compression fracture of thoracic vertebra, unspecified thoracic vertebral level, initial encounter (HCC)  Other low back pain  Cramp and spasm  Difficulty in  walking, not elsewhere classified  Multiple myeloma not having achieved remission (HCC)  Pain in left hip  Cervicalgia  Abnormal posture  Pain in thoracic spine  ONSET DATE: chronic  SUBJECTIVE:  SUBJECTIVE STATEMENT: Raenette Bumps arrives with SPC.  He states he is doing ok but back has been hurting more.  "I went to the gym yesterday and I am a little sore".    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 Yes Pain location: bil knees, Rt hip, upper back, neuropathy in Lt foot > Rt PAIN TYPE: aching, burning, and numbness (foot) Pain description: intermittent and constant  Aggravating factors: exercise, walking - makes me sore Relieving factors: the band exercises help my back   PRECAUTIONS: Other: NO HEAVY RESISTANCE OR WEIGHT LIFTING GIVEN CANCER WITH BONE METS, FALL RISK  RED FLAGS: Cancer with bone mets, fall risk    WEIGHT BEARING RESTRICTIONS: No  FALLS:  Has patient fallen in last 6 months? Yes. Number of falls 0  LIVING ENVIRONMENT: Lives with: lives with their family and lives with their spouse Lives in: House/apartment Stairs: Yes; Internal: 14 steps; on left going up and External: 3 steps; on right going up Has following equipment at home: Single  point cane and Walker - 4 wheeled - rollator   OCCUPATION: semi-retired, attorney   PLOF: Independent with household mobility with device   PATIENT GOALS: LE strength, loosen up back  NEXT MD VISIT: tomorrow  OBJECTIVE:  Note: Objective measures were completed at Evaluation unless otherwise noted.   PATIENT SURVEYS:  Objective measures for fall risk and deconditioning diagnosis - see functional tests  COGNITION: Overall cognitive status: Within functional limits for tasks assessed     SENSATION: Whole foot Rt>Lt numbness related to neuropathy   POSTURE: rounded shoulders, forward head, decreased lumbar lordosis, and increased thoracic kyphosis   TRUNK ROM:    Flexes trunk with fingers to mid-shin with knees bent and holds onto counter Trunk rotation bil 50% Neck rotation bil 50% Neck extension - remains flexed to 5 deg  LOWER EXTREMITY MMT:   Rt hip 4-/5 Lt hip 4/5 Bil knees 4-/5   FUNCTIONAL TESTS:  5 times sit to stand: 19.14 heavy use of UE on chair to rise and control descent Timed up and go (TUG): 18.28 with SPC and use of hands on chair  3 minute walk test: 42' with SCP 10 meter walk test: 12.62 sec with SPC  GAIT: Distance walked: 401 Assistive device utilized: Single point cane Level of assistance: Modified independence Comments: lacks trunk rotation, wide BOS  TREATMENT DATE:  08/16/23: (8 min late for appt) Seated hamstring stretch 3 x 30 sec each  Nustep x 5 min level 5 (PT present to discuss status) Hook lying PPT x 20 PPT with marching x 20 (patient actually states he didn't feel challenged so he wanted to do the unsupported version) PPT with dying bug 2 x 10 PPT with SLR to shoulder extension with 5 lb dumbell 2 x 10 each LE PPT with clamshell using yellow loop x 20 PPT with isometric ball press into knees in hook lying using red physio ball 2 x  10 Trunk rotation x 20 (10 each side)   08/09/23: Initiated HEP (see below - added LE strength  to already existing program from previous episode of care this year which is more focused on stretching and ROM)                                                                                                                                  PATIENT EDUCATION:  Education details: [email or web address] Person educated: Patient Education method: Programmer, multimedia, Demonstration, Verbal cues, and Handouts Education comprehension: verbalized understanding and returned demonstration  HOME EXERCISE PROGRAM: Access Code: [email or web address] URL: [email or web address] Date: 08/09/2023 Prepared by: Minor Amble Beuhring  Exercises - Seated Cervical Rotation AROM  - 1 x daily - 7 x weekly - 1 sets - 10 reps - 10 hold - Seated Cervical Sidebending AROM  - 1 x daily - 7 x weekly - 1 sets - 10 reps - 5 hold - Seated Cervical Extension AROM  - 1 x daily - 7 x weekly - 1 sets - 10 reps - 10 hold - Seated Scapular Retraction  - 1 x daily - 7 x weekly - 1-3 sets - 10 reps - 2-3 sec hold - Sidelying Thoracic Rotation with Open Book  - 1 x daily - 7 x weekly - 2 sets - 5 reps - Seated Thoracic Extension Arms Overhead  - 1 x daily - 7 x weekly - 1-2 sets - 10 reps - 2-3 sec hold - Seated Trunk Rotation  - 1 x daily - 7 x weekly - 1-2 sets - 10 reps - 2-3 sec hold - Seated Diagonal Chops with Medicine Ball  - 1 x daily - 7 x weekly - 1-2 sets - 10 reps - 2-3 hold - Seated Long Arc Quad  - 1 x daily - 7 x weekly - 3 sets - 10 reps - Seated March  - 1 x daily - 7 x weekly - 3 sets - 10 reps - Seated Hip External Rotation AROM  - 1 x daily - 7 x weekly - 3 sets - 10 reps - Supine Hamstring Stretch with Strap  - 1 x daily - 7 x weekly - 1 sets - 3 reps - 20 sec hold - Supine ITB Stretch with Strap  - 1 x daily - 7 x weekly - 1 sets - 3 reps - 20 sec hold - Seated Piriformis Stretch  - 1 x daily - 7 x weekly - 1 sets - 3 reps - 30 sec hold - Seated Hip Adduction Squeeze with Ball  - 1 x daily - 7 x weekly - 3 sets - 10 reps - 2-3  sec hold - Seated 3 Way Exercise Ball Roll Out Stretch  - 1 x daily - 7 x weekly - 1 sets - 10 reps - Seated Long Arc Quad with Ankle Weight  - 1 x daily - 7 x weekly - 2 sets -  10 reps - Seated Hip Flexion March with Ankle Weights  - 1 x daily - 7 x weekly - 2 sets - 10 reps - Seated Hip External Rotation AROM  - 1 x daily - 7 x weekly - 2 sets - 10 reps - Seated Heel Toe Raises  - 1 x daily - 7 x weekly - 2 sets - 10 reps - Standing Hip Abduction with Counter Support  - 1 x daily - 7 x weekly - 2 sets - 10 reps - Standing Hip Extension with Counter Support  - 1 x daily - 7 x weekly - 2 sets - 10 reps - Heel Raises with Counter Support  - 1 x daily - 7 x weekly - 2 sets - 10 reps - Sit to Stand with Armchair  - 5 x daily - 7 x weekly - 1 sets - 3-5 [id number]  ASSESSMENT:  CLINICAL IMPRESSION: Raenette Bumps was able to tolerate several low level core exercises.  He is limited to what he can do because of his hip mobility and overall restricted movement.  He is beginning to work out at [employer]  He should continue to do well.  He would benefit from continuing skilled PT for total body flexibility, strength and functional mobility training.      OBJECTIVE IMPAIRMENTS: Abnormal gait, cardiopulmonary status limiting activity, decreased activity tolerance, decreased balance, decreased coordination, decreased endurance, decreased mobility, difficulty walking, decreased ROM, decreased strength, hypomobility, increased edema, impaired flexibility, impaired sensation, impaired UE functional use, improper body mechanics, postural dysfunction, and pain.   ACTIVITY LIMITATIONS: carrying, lifting, bending, sitting, standing, squatting, sleeping, stairs, transfers, bed mobility, bathing, toileting, dressing, hygiene/grooming, and locomotion level  PARTICIPATION LIMITATIONS: meal prep, cleaning, laundry, shopping, and community activity  PERSONAL FACTORS: Age, Time since onset of  injury/illness/exacerbation, and 3+ comorbidities: a-fib, history of pathological fractures, fall risk, bone mets, neuropathy of bil LE  are also affecting patient's functional outcome.   REHAB POTENTIAL: Good  CLINICAL DECISION MAKING: Evolving/moderate complexity  EVALUATION COMPLEXITY: Moderate   GOALS: Goals reviewed with patient? Yes  SHORT TERM GOALS: Target date: 09/06/23  Pt will be safe and ind with initial HEP Baseline: Goal status: INITIAL  2.  Pt will be able to perform 5-10 reps of sit to stand without use of UE on thighs with controlled descent into chair Baseline:  Goal status: INITIAL  3.  Pt will report reduced thoracic spine pain by at least 25% Baseline:  Goal status: INITIAL  4.  Pt will improve TUG test to 16 sec or less using SPC Baseline: 18.28 Goal status: INITIAL    LONG TERM GOALS: Target date: 10/04/23  Pt will demo safety and ind with HEP and understand the importance of ongoing compliance upon d/c from PT Baseline:  Goal status: INITIAL  2.  Pt will report reduced thoracic spine pain by at least 50%. Baseline:  Goal status: INITIAL  3.  Pt will cover at least 800' with [facility] in a to improve endurance for community outings. Baseline: 36' in with SPC Goal status: INITIAL  4.  Pt will improve mobility to allow him to safely reach for object on floor using light UE support Baseline:  Goal status: INITIAL  5.  Pt will improve speed with 10 meter walk test to 11.5 sec Baseline: 12.62 Goal status: INITIAL  6.  Pt will improve 5X STS to 16 sec or less without use of UE on chair to demo improved functional strength and  reduced fall risk. Baseline:  Goal status: INITIAL  PLAN:  PT FREQUENCY: 2x/week  PT DURATION: 8 weeks  PLANNED INTERVENTIONS: 97110-Therapeutic exercises, 97530- Therapeutic activity, 97112- Neuromuscular re-education, 97535- Self Care, 16109- Manual therapy, [phone]- Gait training, Patient/Family education,  Balance training, Stair training, Dry Needling, Joint mobilization, Spinal mobilization, Cryotherapy, and Moist heat.  PLAN FOR NEXT SESSION: Nustep, review HEP (PT added LE strength to already existing HEP from last episode of care), gait endurance, sit to stand, HS flexibility, functional body weight strength and trunk mobility - no heavy lifting due to bone mets   Vernis Cabacungan B. Vaiden Adames, PT 08/16/23 9:45 PM  [facility] [address], [address] [place], [place] [address] Phone # [phone] Fax [phone]

## 2023-08-18 ENCOUNTER — Encounter

## 2023-08-19 DIAGNOSIS — Z92850 Personal history of chimeric antigen receptor t-cell therapy: Secondary | ICD-10-CM | POA: Diagnosis not present

## 2023-08-19 DIAGNOSIS — C9000 Multiple myeloma not having achieved remission: Secondary | ICD-10-CM | POA: Diagnosis not present

## 2023-08-19 DIAGNOSIS — D472 Monoclonal gammopathy: Secondary | ICD-10-CM | POA: Diagnosis not present

## 2023-08-22 ENCOUNTER — Ambulatory Visit: Admitting: Physical Therapy

## 2023-08-22 ENCOUNTER — Encounter: Payer: Self-pay | Admitting: Physical Therapy

## 2023-08-22 DIAGNOSIS — M25552 Pain in left hip: Secondary | ICD-10-CM | POA: Diagnosis not present

## 2023-08-22 DIAGNOSIS — M5459 Other low back pain: Secondary | ICD-10-CM

## 2023-08-22 DIAGNOSIS — R262 Difficulty in walking, not elsewhere classified: Secondary | ICD-10-CM

## 2023-08-22 DIAGNOSIS — M6281 Muscle weakness (generalized): Secondary | ICD-10-CM

## 2023-08-22 DIAGNOSIS — M542 Cervicalgia: Secondary | ICD-10-CM

## 2023-08-22 DIAGNOSIS — S22000A Wedge compression fracture of unspecified thoracic vertebra, initial encounter for closed fracture: Secondary | ICD-10-CM | POA: Diagnosis not present

## 2023-08-22 DIAGNOSIS — C9000 Multiple myeloma not having achieved remission: Secondary | ICD-10-CM | POA: Diagnosis not present

## 2023-08-22 DIAGNOSIS — R252 Cramp and spasm: Secondary | ICD-10-CM

## 2023-08-22 DIAGNOSIS — R293 Abnormal posture: Secondary | ICD-10-CM

## 2023-08-22 DIAGNOSIS — M546 Pain in thoracic spine: Secondary | ICD-10-CM | POA: Diagnosis not present

## 2023-08-22 NOTE — Therapy (Signed)
 OUTPATIENT PHYSICAL THERAPY ORTHOPEDIC TREATMENT   Patient Name: Travis [name] MRN: [id number] DOB:June 29, 1950, 73 y.o., male Today's Date: 08/22/2023  END OF SESSION:  PT End of Session - 08/22/23 1101     Visit Number 3    Date for PT Re-Evaluation 10/04/23    Authorization Type [employer] MCR    Progress Note Due on Visit 10    PT Start Time 1101    PT Stop Time 1143    PT Time Calculation (min) 42 min    Activity Tolerance Patient tolerated treatment well    Behavior During Therapy WFL for tasks assessed/performed              Past Medical History:  Diagnosis Date   A-fib (HCC)    Arthritis    "comes and goes; mostly in hands, occasionally elbow" (04/05/2017)   Complication of anesthesia    "just wanted to sleep alot  for couple days S/P VARICOCELE OR" (04/05/2017)   DVT (deep venous thrombosis) (HCC)    LLE   Dysrhythmia    PVCs    GERD (gastroesophageal reflux disease)    History of hiatal hernia    History of radiation therapy    Thoracic Spine- 07/13/22-07/23/22-Dr. Retta Caster   Hypertension    Impaired glucose tolerance    Multiple myeloma (HCC)    Obesity (BMI 30-39.9) 07/26/2016   OSA on CPAP 07/26/2016   Mild with AHI 12.5/hr now on CPAP at 14cm H2O   PAF (paroxysmal atrial fibrillation) (HCC)    s/p PVI Ablation in 2018 // Flecainide  Rx // Echo 8/21: EF 60-65, no RWMA, mild LVH, normal RV SF, moderate RVE, mild BAE, trivial MR, mild dilation of aortic root (40 mm)   Pneumonia    "may have had walking pneumonia a few years ago" (04/05/2017)   Pre-diabetes    Thrombophlebitis    Past Surgical History:  Procedure Laterality Date   ATRIAL FIBRILLATION ABLATION  04/05/2017   ATRIAL FIBRILLATION ABLATION N/A 04/05/2017   Procedure: ATRIAL FIBRILLATION ABLATION;  Surgeon: Jolly Needle, MD;  Location: [facility];  Service: Cardiovascular;  Laterality: N/A;   BONE BIOPSY Right 12/17/2021   Procedure: BONE BIOPSY;  Surgeon: Micheline Ahr, MD;   Location: [facility];  Service: Orthopedics;  Laterality: Right;   COMPLETE RIGHT HIP REPLACMENT  01/19/2019   EVALUATION UNDER ANESTHESIA WITH HEMORRHOIDECTOMY N/A 02/24/2017   Procedure: EXAM UNDER ANESTHESIA WITH  HEMORRHOIDECTOMY;  Surgeon: Candyce Champagne, MD;  Location: [facility];  Service: General;  Laterality: N/A;   EXCISIONAL TOTAL HIP ARTHROPLASTY WITH ANTIBIOTIC SPACERS Right 09/25/2020   Procedure: EXCISIONAL TOTAL HIP ARTHROPLASTY WITH ANTIBIOTIC SPACERS;  Surgeon: Claiborne Crew, MD;  Location: [facility];  Service: Orthopedics;  Laterality: Right;   FLEXIBLE SIGMOIDOSCOPY N/A 04/15/2015   Procedure:  UNSEDATED FLEXIBLE SIGMOIDOSCOPY;  Surgeon: Garrett Kallman, MD;  Location: [facility];  Service: Endoscopy;  Laterality: N/A;   HUMERUS IM NAIL Right 12/17/2021   Procedure: INTRAMEDULLARY (IM) NAIL HUMERAL;  Surgeon: Micheline Ahr, MD;  Location: [facility];  Service: Orthopedics;  Laterality: Right;   KNEE ARTHROSCOPY Left ~ 2016   POSTERIOR CERVICAL FUSION/FORAMINOTOMY N/A 06/04/2022   Procedure: Thoracic one - Thoracic Four Posterior lateral arthrodesis/ fusion and Thoracic two Corpectomy;  Surgeon: Audie Bleacher, MD;  Location: [facility];  Service: Neurosurgery;  Laterality: N/A;   REIMPLANTATION OF TOTAL HIP Right 12/25/2020   Procedure: REIMPLANTATION/REVISION OF RIGHT TOTAL HIP VERSUS REPEAT IRRIGATION AND DEBRIDEMENT;  Surgeon: Claiborne Crew, MD;  Location: [facility];  Service: Orthopedics;  Laterality: Right;   TESTICLE SURGERY  1988   VARICOCELE   TONSILLECTOMY     VARICOSE VEIN SURGERY Bilateral    Patient Active Problem List   Diagnosis Date Noted   Pancreatic cyst 09/30/2022   Infection due to novel influenza A virus 09/29/2022   Persistent atrial fibrillation (HCC) 07/15/2022   Acute on chronic diastolic heart failure (HCC) 07/03/2022   Pulmonary infiltrates 07/01/2022   Acute respiratory failure with hypoxia (HCC) 07/01/2022   Hyponatremia  06/29/2022   History of thoracic surgery 06/29/2022   Acute DVT of left tibial vein (HCC) 06/21/2022   Hypokalemia 06/21/2022   Vitamin D  deficiency 05/30/2022   Other constipation 05/29/2022   Pathologic compression fracture of spine, initial encounter (HCC) 05/29/2022   Cord compression (HCC) 05/28/2022   Degenerative cervical disc 05/14/2022   Hypercoagulable state due to persistent atrial fibrillation (HCC) 01/11/2022   Multiple myeloma (HCC) 12/10/2021   Medication monitoring encounter 02/27/2021   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   Status post peripherally inserted central catheter (PICC) central line placement 11/06/2020   Drug rash with eosinophilia and systemic symptoms syndrome 11/06/2020   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   PAF (paroxysmal atrial fibrillation) (HCC)    GERD (gastroesophageal reflux disease) 03/29/2018   Hiatal hernia 03/29/2018   History of DVT (deep vein thrombosis) 03/29/2018   CAP (community acquired pneumonia) 03/29/2018   Osteoarthritis 03/29/2018   Neuropathy 08/29/2017   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   Paroxysmal atrial fibrillation with rapid ventricular response (HCC) 04/05/2017   Prolapsed internal hemorrhoids, grade 4, s/p ligation/pexy/hemorrhoidectomy x 2 02/24/2017 02/24/2017   OSA (obstructive sleep apnea) 07/26/2016   Obesity (BMI 30-39.9) 07/26/2016   Atrial fibrillation with RVR (HCC)    Essential hypertension    Chest pain at rest     PCP: Charlene Conners, MD  REFERRING PROVIDER: Gery Kubas, NP  REFERRING DIAG: R53.81 (ICD-10-CM) - Physical deconditioning C90.00 (ICD-10-CM) - Multiple myeloma myelomatosis (HCC)  Rationale for Evaluation and Treatment: Rehabilitation  THERAPY DIAG:  Muscle weakness (generalized)  Other low back pain  Cramp and spasm  Difficulty in walking, not elsewhere classified  Pain in left hip  Cervicalgia  Abnormal posture  ONSET  DATE: chronic  SUBJECTIVE:                                                                                                                                                                                           SUBJECTIVE STATEMENT: My Rt hip is bothering me today.  Legs feel numb  and weak today.  Back is more stiff than usual.  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 Yes Pain location: bil knees, Rt hip, upper back, neuropathy in Lt foot > Rt PAIN TYPE: aching, burning, and numbness (foot) Pain description: intermittent and constant  Aggravating factors: exercise, walking - makes me sore Relieving factors: the band exercises help my back   PRECAUTIONS: Other: NO HEAVY RESISTANCE OR WEIGHT LIFTING GIVEN CANCER WITH BONE METS, FALL RISK  RED FLAGS: Cancer with bone mets, fall risk    WEIGHT BEARING RESTRICTIONS: No  FALLS:  Has patient fallen in last 6 months? Yes. Number of falls 0  LIVING ENVIRONMENT: Lives with: lives with their family and lives with their spouse Lives in: House/apartment Stairs: Yes; Internal: 14 steps; on left going up and External: 3 steps; on right going up Has following equipment at home: Single point cane and Walker - 4 wheeled - rollator   OCCUPATION: semi-retired, attorney   PLOF: Independent with  household mobility with device   PATIENT GOALS: LE strength, loosen up back  NEXT MD VISIT: tomorrow  OBJECTIVE:  Note: Objective measures were completed at Evaluation unless otherwise noted.   PATIENT SURVEYS:  Objective measures for fall risk and deconditioning diagnosis - see functional tests  COGNITION: Overall cognitive status: Within functional limits for tasks assessed     SENSATION: Whole foot Rt>Lt numbness related to neuropathy   POSTURE: rounded shoulders, forward head, decreased lumbar lordosis, and increased thoracic kyphosis   TRUNK ROM:    Flexes trunk with fingers to mid-shin with knees bent and holds onto counter Trunk rotation bil 50% Neck rotation bil 50% Neck extension - remains flexed to 5 deg  LOWER EXTREMITY MMT:   Rt hip 4-/5 Lt hip 4/5 Bil knees 4-/5   FUNCTIONAL TESTS:  5 times sit to stand: 19.14 heavy use of UE on chair to rise and control descent Timed up and go (TUG): 18.28 with SPC and use of hands on chair  3 minute walk test: 44' with SCP 10 meter walk test: 12.62 sec with SPC  GAIT: Distance walked: 401 Assistive device utilized: Single point cane Level of assistance: Modified independence Comments: lacks trunk rotation, wide BOS  TREATMENT DATE:  08/22/23: Discussion of how Pt was feeling - more weak, stiff and numb than usual Seated blue tband row x10, then single arm row with ipsilateral rotation x10 alt directions Seated banded trunk extension purple power cord x10 LAQ with ball squeeze - 3lb 10x5", 5lb 1x10 Seated hamstring curl with black loop band 2x10 Seated clam black loop 2x10 Sit to stand from chair with UE assist on chair x 10 Supine PPT x20 PPT with alt LE march x20 LTR x20 Bil shoulder extension pulling blue tband down to table - PT anchoring above  head Supine HS curl feet on red physioball x20 - PT cued dig heels into ball Supine hooklying UE ball press into red ball against thighs 10x5" Supine hooklying  yellow weighted ball diagonal UE chop/lift x10 each way   08/16/23: (8 min late for appt) Seated hamstring stretch 3 x 30 sec each  Nustep x 5 min level 5 (PT present to discuss status) Hook lying PPT x 20 PPT with marching x 20 (patient actually states he didn't feel challenged so he wanted to do the unsupported version) PPT with dying bug 2 x 10 PPT with SLR to shoulder extension with 5 lb dumbell 2 x 10 each LE PPT with clamshell using yellow loop x 20 PPT with isometric ball press into knees in hook lying using red physio ball 2 x 10 Trunk rotation x 20 (10 each side)   08/09/23: Initiated HEP (see below - added LE strength to already existing program from previous episode of care this year which is more focused on stretching and ROM)                                                                                                                                  PATIENT EDUCATION:  Education details: [email or web address] Person educated: Patient Education method: Programmer, multimedia, Demonstration, Verbal cues, and Handouts Education comprehension: verbalized understanding and returned demonstration  HOME EXERCISE PROGRAM: Access Code: [email or web address] URL: [email or web address] Date: 08/09/2023 Prepared by: Minor Amble Isreal Moline  Exercises - Seated Cervical Rotation AROM  - 1 x daily - 7 x weekly - 1 sets - 10 reps - 10 hold - Seated Cervical Sidebending AROM  - 1 x daily - 7 x weekly - 1 sets - 10 reps - 5 hold - Seated Cervical Extension AROM  - 1 x daily - 7 x weekly - 1 sets - 10 reps - 10 hold - Seated Scapular Retraction  - 1 x daily - 7 x weekly - 1-3 sets - 10 reps - 2-3 sec hold - Sidelying Thoracic Rotation with Open Book  - 1 x daily - 7 x weekly - 2 sets - 5 reps - Seated Thoracic Extension Arms Overhead  - 1 x daily - 7 x weekly - 1-2 sets - 10 reps - 2-3 sec hold - Seated Trunk Rotation  - 1 x daily - 7 x weekly - 1-2 sets - 10 reps - 2-3 sec hold - Seated Diagonal Chops with  Medicine Ball  - 1 x daily - 7 x weekly - 1-2 sets - 10 reps - 2-3 hold - Seated Long Arc Quad  - 1 x daily - 7 x weekly - 3 sets - 10 reps - Seated March  - 1 x daily - 7 x weekly - 3 sets - 10 reps - Seated Hip External Rotation AROM  - 1 x daily - 7 x weekly - 3 sets - 10 reps - Supine Hamstring Stretch with Strap  -  1 x daily - 7 x weekly - 1 sets - 3 reps - 20 sec hold - Supine ITB Stretch with Strap  - 1 x daily - 7 x weekly - 1 sets - 3 reps - 20 sec hold - Seated Piriformis Stretch  - 1 x daily - 7 x weekly - 1 sets - 3 reps - 30 sec hold - Seated Hip Adduction Squeeze with Ball  - 1 x daily - 7 x weekly - 3 sets - 10 reps - 2-3 sec hold - Seated 3 Way Exercise [employer] Out Stretch  - 1 x daily - 7 x weekly - 1 sets - 10 reps - Seated Long Arc Quad with Ankle Weight  - 1 x daily - 7 x weekly - 2 sets - 10 reps - Seated Hip Flexion March with Ankle Weights  - 1 x daily - 7 x weekly - 2 sets - 10 reps - Seated Hip External Rotation AROM  - 1 x daily - 7 x weekly - 2 sets - 10 reps - Seated Heel Toe Raises  - 1 x daily - 7 x weekly - 2 sets - 10 reps - Standing Hip Abduction with Counter Support  - 1 x daily - 7 x weekly - 2 sets - 10 reps - Standing Hip Extension with Counter Support  - 1 x daily - 7 x weekly - 2 sets - 10 reps - Heel Raises with Counter Support  - 1 x daily - 7 x weekly - 2 sets - 10 reps - Sit to Stand with Armchair  - 5 x daily - 7 x weekly - 1 sets - 3-5 [email or web address]  ASSESSMENT:  CLINICAL IMPRESSION: Travis [name] arrived feeling more weak, numb and stiff than usual.  He sat in a golf cart x3 days over the weekend watching [employer] golf, and also wonders if he is feeling side effects of steroids.  We performed resistance training for UE/LE and trunk/core from seated and supine today given he wasn't feeling as well and he was able to perform all therex with report of appropriate level challenge and no worsening of symptoms.  He is very motivated and works hard in sessions.     OBJECTIVE IMPAIRMENTS: Abnormal gait, cardiopulmonary status limiting activity, decreased activity tolerance, decreased balance, decreased coordination, decreased endurance, decreased mobility, difficulty walking, decreased ROM, decreased strength, hypomobility, increased edema, impaired flexibility, impaired sensation, impaired UE functional use, improper body mechanics, postural dysfunction, and pain.   ACTIVITY LIMITATIONS: carrying, lifting, bending, sitting, standing, squatting, sleeping, stairs, transfers, bed mobility, bathing, toileting, dressing, hygiene/grooming, and locomotion level  PARTICIPATION LIMITATIONS: meal prep, cleaning, laundry, shopping, and community activity  PERSONAL FACTORS: Age, Time since onset of injury/illness/exacerbation, and 3+ comorbidities: a-fib, history of pathological fractures, fall risk, bone mets, neuropathy of bil LE  are also affecting patient's functional outcome.   REHAB POTENTIAL: Good  CLINICAL DECISION MAKING: Evolving/moderate complexity  EVALUATION COMPLEXITY: Moderate   GOALS: Goals reviewed with patient? Yes  SHORT TERM GOALS: Target date: 09/06/23  Pt will be safe and ind with initial HEP Baseline: Goal status: MET 4/21  2.  Pt will be able to perform 5-10 reps of sit to stand without use of UE on thighs with controlled descent into chair Baseline:  Goal status: INITIAL  3.  Pt will report reduced thoracic spine pain by at least 25% Baseline:  Goal status: INITIAL  4.  Pt will improve TUG test to 16 sec or less using SPC  Baseline: 18.28 Goal status: INITIAL    LONG TERM GOALS: Target date: 10/04/23  Pt will demo safety and ind with HEP and understand the importance of ongoing compliance upon d/c from PT Baseline:  Goal status: INITIAL  2.  Pt will report reduced thoracic spine pain by at least 50%. Baseline:  Goal status: INITIAL  3.  Pt will cover at least 800' with [facility] in a to improve endurance for community  outings. Baseline: 41' in with SPC Goal status: INITIAL  4.  Pt will improve mobility to allow him to safely reach for object on floor using light UE support Baseline:  Goal status: INITIAL  5.  Pt will improve speed with 10 meter walk test to 11.5 sec Baseline: 12.62 Goal status: INITIAL  6.  Pt will improve 5X STS to 16 sec or less without use of UE on chair to demo improved functional strength and reduced fall risk. Baseline:  Goal status: INITIAL  PLAN:  PT FREQUENCY: 2x/week  PT DURATION: 8 weeks  PLANNED INTERVENTIONS: 97110-Therapeutic exercises, 97530- Therapeutic activity, 97112- Neuromuscular re-education, 97535- Self Care, 02725- Manual therapy, [phone]- Gait training, Patient/Family education, Balance training, Stair training, Dry Needling, Joint mobilization, Spinal mobilization, Cryotherapy, and Moist heat.  PLAN FOR NEXT SESSION: Nustep, review HEP (PT added LE strength to already existing HEP from last episode of care), gait endurance, sit to stand, HS flexibility, functional body weight strength and trunk mobility - no heavy lifting due to bone mets   Raynell Caller, PT 08/22/23 11:44 AM  [facility] [address] [place], [place] [address] Phone # [phone] Fax [phone]

## 2023-08-24 ENCOUNTER — Ambulatory Visit: Admitting: Physical Therapy

## 2023-08-24 ENCOUNTER — Encounter: Payer: Self-pay | Admitting: Physical Therapy

## 2023-08-24 DIAGNOSIS — M25552 Pain in left hip: Secondary | ICD-10-CM | POA: Diagnosis not present

## 2023-08-24 DIAGNOSIS — M542 Cervicalgia: Secondary | ICD-10-CM

## 2023-08-24 DIAGNOSIS — M5459 Other low back pain: Secondary | ICD-10-CM | POA: Diagnosis not present

## 2023-08-24 DIAGNOSIS — C9000 Multiple myeloma not having achieved remission: Secondary | ICD-10-CM | POA: Diagnosis not present

## 2023-08-24 DIAGNOSIS — M546 Pain in thoracic spine: Secondary | ICD-10-CM | POA: Diagnosis not present

## 2023-08-24 DIAGNOSIS — S22000A Wedge compression fracture of unspecified thoracic vertebra, initial encounter for closed fracture: Secondary | ICD-10-CM

## 2023-08-24 DIAGNOSIS — R293 Abnormal posture: Secondary | ICD-10-CM

## 2023-08-24 DIAGNOSIS — R252 Cramp and spasm: Secondary | ICD-10-CM

## 2023-08-24 DIAGNOSIS — R262 Difficulty in walking, not elsewhere classified: Secondary | ICD-10-CM

## 2023-08-24 DIAGNOSIS — M6281 Muscle weakness (generalized): Secondary | ICD-10-CM

## 2023-08-24 NOTE — Therapy (Signed)
 OUTPATIENT PHYSICAL THERAPY ORTHOPEDIC TREATMENT   Patient Name: Travis [name] MRN: [id number] DOB:1950/10/04, 73 y.o., male Today's Date: 08/24/2023  END OF SESSION:  PT End of Session - 08/24/23 1110     Visit Number 4    Date for PT Re-Evaluation 10/04/23    Authorization Type [employer] MCR    Progress Note Due on Visit 10    PT Start Time 1110    PT Stop Time 1145    PT Time Calculation (min) 35 min    Activity Tolerance Patient tolerated treatment well    Behavior During Therapy WFL for tasks assessed/performed               Past Medical History:  Diagnosis Date   A-fib (HCC)    Arthritis    "comes and goes; mostly in hands, occasionally elbow" (04/05/2017)   Complication of anesthesia    "just wanted to sleep alot  for couple days S/P VARICOCELE OR" (04/05/2017)   DVT (deep venous thrombosis) (HCC)    LLE   Dysrhythmia    PVCs    GERD (gastroesophageal reflux disease)    History of hiatal hernia    History of radiation therapy    Thoracic Spine- 07/13/22-07/23/22-Dr. Retta Caster   Hypertension    Impaired glucose tolerance    Multiple myeloma (HCC)    Obesity (BMI 30-39.9) 07/26/2016   OSA on CPAP 07/26/2016   Mild with AHI 12.5/hr now on CPAP at 14cm H2O   PAF (paroxysmal atrial fibrillation) (HCC)    s/p PVI Ablation in 2018 // Flecainide  Rx // Echo 8/21: EF 60-65, no RWMA, mild LVH, normal RV SF, moderate RVE, mild BAE, trivial MR, mild dilation of aortic root (40 mm)   Pneumonia    "may have had walking pneumonia a few years ago" (04/05/2017)   Pre-diabetes    Thrombophlebitis    Past Surgical History:  Procedure Laterality Date   ATRIAL FIBRILLATION ABLATION  04/05/2017   ATRIAL FIBRILLATION ABLATION N/A 04/05/2017   Procedure: ATRIAL FIBRILLATION ABLATION;  Surgeon: Jolly Needle, MD;  Location: [facility];  Service: Cardiovascular;  Laterality: N/A;   BONE BIOPSY Right 12/17/2021   Procedure: BONE BIOPSY;  Surgeon: Micheline Ahr, MD;   Location: [facility];  Service: Orthopedics;  Laterality: Right;   COMPLETE RIGHT HIP REPLACMENT  01/19/2019   EVALUATION UNDER ANESTHESIA WITH HEMORRHOIDECTOMY N/A 02/24/2017   Procedure: EXAM UNDER ANESTHESIA WITH  HEMORRHOIDECTOMY;  Surgeon: Candyce Champagne, MD;  Location: [facility];  Service: General;  Laterality: N/A;   EXCISIONAL TOTAL HIP ARTHROPLASTY WITH ANTIBIOTIC SPACERS Right 09/25/2020   Procedure: EXCISIONAL TOTAL HIP ARTHROPLASTY WITH ANTIBIOTIC SPACERS;  Surgeon: Claiborne Crew, MD;  Location: [facility];  Service: Orthopedics;  Laterality: Right;   FLEXIBLE SIGMOIDOSCOPY N/A 04/15/2015   Procedure:  UNSEDATED FLEXIBLE SIGMOIDOSCOPY;  Surgeon: Garrett Kallman, MD;  Location: [facility];  Service: Endoscopy;  Laterality: N/A;   HUMERUS IM NAIL Right 12/17/2021   Procedure: INTRAMEDULLARY (IM) NAIL HUMERAL;  Surgeon: Micheline Ahr, MD;  Location: [facility];  Service: Orthopedics;  Laterality: Right;   KNEE ARTHROSCOPY Left ~ 2016   POSTERIOR CERVICAL FUSION/FORAMINOTOMY N/A 06/04/2022   Procedure: Thoracic one - Thoracic Four Posterior lateral arthrodesis/ fusion and Thoracic two Corpectomy;  Surgeon: Audie Bleacher, MD;  Location: [facility];  Service: Neurosurgery;  Laterality: N/A;   REIMPLANTATION OF TOTAL HIP Right 12/25/2020   Procedure: REIMPLANTATION/REVISION OF RIGHT TOTAL HIP VERSUS REPEAT IRRIGATION AND  DEBRIDEMENT;  Surgeon: Claiborne Crew, MD;  Location: [facility];  Service: Orthopedics;  Laterality: Right;   TESTICLE SURGERY  1988   VARICOCELE   TONSILLECTOMY     VARICOSE VEIN SURGERY Bilateral    Patient Active Problem List   Diagnosis Date Noted   Pancreatic cyst 09/30/2022   Infection due to novel influenza A virus 09/29/2022   Persistent atrial fibrillation (HCC) 07/15/2022   Acute on chronic diastolic heart failure (HCC) 07/03/2022   Pulmonary infiltrates 07/01/2022   Acute respiratory failure with hypoxia (HCC) 07/01/2022   Hyponatremia  06/29/2022   History of thoracic surgery 06/29/2022   Acute DVT of left tibial vein (HCC) 06/21/2022   Hypokalemia 06/21/2022   Vitamin D  deficiency 05/30/2022   Other constipation 05/29/2022   Pathologic compression fracture of spine, initial encounter (HCC) 05/29/2022   Cord compression (HCC) 05/28/2022   Degenerative cervical disc 05/14/2022   Hypercoagulable state due to persistent atrial fibrillation (HCC) 01/11/2022   Multiple myeloma (HCC) 12/10/2021   Medication monitoring encounter 02/27/2021   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   Status post peripherally inserted central catheter (PICC) central line placement 11/06/2020   Drug rash with eosinophilia and systemic symptoms syndrome 11/06/2020   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   PAF (paroxysmal atrial fibrillation) (HCC)    GERD (gastroesophageal reflux disease) 03/29/2018   Hiatal hernia 03/29/2018   History of DVT (deep vein thrombosis) 03/29/2018   CAP (community acquired pneumonia) 03/29/2018   Osteoarthritis 03/29/2018   Neuropathy 08/29/2017   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   Paroxysmal atrial fibrillation with rapid ventricular response (HCC) 04/05/2017   Prolapsed internal hemorrhoids, grade 4, s/p ligation/pexy/hemorrhoidectomy x 2 02/24/2017 02/24/2017   OSA (obstructive sleep apnea) 07/26/2016   Obesity (BMI 30-39.9) 07/26/2016   Atrial fibrillation with RVR (HCC)    Essential hypertension    Chest pain at rest     PCP: Charlene Conners, MD  REFERRING PROVIDER: Gery Kubas, NP  REFERRING DIAG: R53.81 (ICD-10-CM) - Physical deconditioning C90.00 (ICD-10-CM) - Multiple myeloma myelomatosis (HCC)  Rationale for Evaluation and Treatment: Rehabilitation  THERAPY DIAG:  Muscle weakness (generalized)  Other low back pain  Cramp and spasm  Difficulty in walking, not elsewhere classified  Pain in left hip  Cervicalgia  Abnormal  posture  Compression fracture of thoracic vertebra, unspecified thoracic vertebral level, initial encounter (HCC)  ONSET DATE: chronic  SUBJECTIVE:  SUBJECTIVE STATEMENT: I'm doing better than the past 5 days. Back muscles are still the biggest problem - mobility and weakness wise. 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 Yes Pain location: bil knees, Rt hip, upper back, neuropathy in Lt foot > Rt PAIN TYPE: aching, burning, and numbness (foot) Pain description: intermittent and constant  Aggravating factors: exercise, walking - makes me sore Relieving factors: the band exercises help my back   PRECAUTIONS: Other: NO HEAVY RESISTANCE OR WEIGHT LIFTING GIVEN CANCER WITH BONE METS, FALL RISK  RED FLAGS: Cancer with bone mets, fall risk    WEIGHT BEARING RESTRICTIONS: No  FALLS:  Has patient fallen in last 6 months? Yes. Number of falls 0  LIVING ENVIRONMENT: Lives with: lives with their family and lives with their spouse Lives in: House/apartment Stairs: Yes; Internal: 14 steps; on left going up and External: 3 steps; on right going up Has following equipment at home: Single point cane and  Walker - 4 wheeled - rollator   OCCUPATION: semi-retired, attorney   PLOF: Independent with household mobility with device   PATIENT GOALS: LE strength, loosen up back  NEXT MD VISIT: tomorrow  OBJECTIVE:  Note: Objective measures were completed at Evaluation unless otherwise noted.   PATIENT SURVEYS:  Objective measures for fall risk and deconditioning diagnosis - see functional tests  COGNITION: Overall cognitive status: Within functional limits for tasks assessed     SENSATION: Whole foot Rt>Lt numbness related to neuropathy   POSTURE: rounded shoulders, forward head, decreased lumbar lordosis, and increased thoracic kyphosis   TRUNK ROM:    Flexes trunk with fingers to mid-shin with knees bent and holds onto counter Trunk rotation bil 50% Neck rotation bil 50% Neck extension - remains flexed to 5 deg  LOWER EXTREMITY MMT:   Rt hip 4-/5 Lt hip 4/5 Bil knees 4-/5   FUNCTIONAL TESTS:  5 times sit to stand: 19.14 heavy use of UE on chair to rise and control descent Timed up and go (TUG): 18.28 with SPC and use of hands on chair  3 minute walk test: 66' with SCP 10 meter walk test: 12.62 sec with SPC  GAIT: Distance walked: 401 Assistive device utilized: Single point cane Level of assistance: Modified independence Comments: lacks trunk rotation, wide BOS  TREATMENT DATE:  08/24/23 (10 min late) Nustep L 5 x 5 min Seated blue tband row x10, then single arm row with ipsilateral rotation x10 alt directions LAQ with ball squeeze -  5lb 2x10 5 sec hold Seated hamstring curl with black loop band 2x10 Seated clam black loop 2x10 Sit to stand from chair with UE assist on chair x 10 PPT with alt LE march x20 LTR x20 Bil shoulder extension pulling blue tband down to table - PT anchoring above head  08/22/23: Discussion of how Pt was feeling - more weak, stiff and numb than  usual Seated blue tband row x10, then single arm row with ipsilateral rotation x10 alt  directions Seated banded trunk extension purple power cord x10 LAQ with ball squeeze - 3lb 10x5", 5lb 1x10 Seated hamstring curl with black loop band 2x10 Seated clam black loop 2x10 Sit to stand from chair with UE assist on chair x 10 Supine PPT x20 PPT with alt LE march x20 LTR x20 Bil shoulder extension pulling blue tband down to table - PT anchoring above head Supine HS curl feet on red physioball x20 - PT cued dig heels into ball Supine hooklying UE ball press into red ball against thighs 10x5" Supine hooklying yellow weighted ball diagonal UE chop/lift x10 each way   08/16/23: (8 min late for appt) Seated hamstring stretch 3 x 30 sec each  Nustep x 5 min level 5 (PT present to discuss status) Hook lying PPT x 20 PPT with marching x 20 (patient actually states he didn't feel challenged so he wanted to do the unsupported version) PPT with dying bug 2 x 10 PPT with SLR to shoulder extension with 5 lb dumbell 2 x 10 each LE PPT with clamshell using yellow loop x 20 PPT with isometric ball press into knees in hook lying using red physio ball 2 x 10 Trunk rotation x 20 (10 each side)   08/09/23: Initiated HEP (see below - added LE strength to already existing program from previous episode of care this year which is more focused on stretching and ROM)                                                                                                                                  PATIENT EDUCATION:  Education details: [email or web address] Person educated: Patient Education method: Programmer, multimedia, Demonstration, Verbal cues, and Handouts Education comprehension: verbalized understanding and returned demonstration  HOME EXERCISE PROGRAM: Access Code: [email or web address] URL: [email or web address] Date: 08/09/2023 Prepared by: Minor Amble Beuhring  Exercises - Seated Cervical Rotation AROM  - 1 x daily - 7 x weekly - 1 sets - 10 reps - 10 hold - Seated Cervical Sidebending AROM  - 1 x daily - 7  x weekly - 1 sets - 10 reps - 5 hold - Seated Cervical Extension AROM  - 1 x daily - 7 x weekly - 1 sets - 10 reps - 10 hold - Seated Scapular Retraction  - 1 x daily - 7 x weekly - 1-3 sets - 10 reps - 2-3 sec hold - Sidelying Thoracic Rotation with Open Book  - 1 x daily - 7 x weekly - 2 sets - 5 reps - Seated Thoracic Extension Arms Overhead  - 1 x daily - 7 x weekly - 1-2 sets - 10 reps - 2-3 sec hold - Seated Trunk Rotation  - 1 x daily - 7 x weekly - 1-2 sets - 10 reps - 2-3 sec hold - Seated Diagonal Chops with Medicine Celeste Cola  -  1 x daily - 7 x weekly - 1-2 sets - 10 reps - 2-3 hold - Seated Long Arc Quad  - 1 x daily - 7 x weekly - 3 sets - 10 reps - Seated March  - 1 x daily - 7 x weekly - 3 sets - 10 reps - Seated Hip External Rotation AROM  - 1 x daily - 7 x weekly - 3 sets - 10 reps - Supine Hamstring Stretch with Strap  - 1 x daily - 7 x weekly - 1 sets - 3 reps - 20 sec hold - Supine ITB Stretch with Strap  - 1 x daily - 7 x weekly - 1 sets - 3 reps - 20 sec hold - Seated Piriformis Stretch  - 1 x daily - 7 x weekly - 1 sets - 3 reps - 30 sec hold - Seated Hip Adduction Squeeze with Ball  - 1 x daily - 7 x weekly - 3 sets - 10 reps - 2-3 sec hold - Seated 3 Way Exercise [employer] Out Stretch  - 1 x daily - 7 x weekly - 1 sets - 10 reps - Seated Long Arc Quad with Ankle Weight  - 1 x daily - 7 x weekly - 2 sets - 10 reps - Seated Hip Flexion March with Ankle Weights  - 1 x daily - 7 x weekly - 2 sets - 10 reps - Seated Hip External Rotation AROM  - 1 x daily - 7 x weekly - 2 sets - 10 reps - Seated Heel Toe Raises  - 1 x daily - 7 x weekly - 2 sets - 10 reps - Standing Hip Abduction with Counter Support  - 1 x daily - 7 x weekly - 2 sets - 10 reps - Standing Hip Extension with Counter Support  - 1 x daily - 7 x weekly - 2 sets - 10 reps - Heel Raises with Counter Support  - 1 x daily - 7 x weekly - 2 sets - 10 reps - Sit to Stand with Armchair  - 5 x daily - 7 x weekly - 1 sets -  3-5 [id number]  ASSESSMENT:  CLINICAL IMPRESSION:  Patient presents reporting improvement since last visit. He tolerated all exercises until moving into hooklying. At that point his back was very painful. He reports he has difficulty at times in this position and may benefit from doing most strengthening in sitting and standing moving forward. After a few minutes of rest in sitting, pain decreased and he was able to leave the clinic at baseline.   OBJECTIVE IMPAIRMENTS: Abnormal gait, cardiopulmonary status limiting activity, decreased activity tolerance, decreased balance, decreased coordination, decreased endurance, decreased mobility, difficulty walking, decreased ROM, decreased strength, hypomobility, increased edema, impaired flexibility, impaired sensation, impaired UE functional use, improper body mechanics, postural dysfunction, and pain.   ACTIVITY LIMITATIONS: carrying, lifting, bending, sitting, standing, squatting, sleeping, stairs, transfers, bed mobility, bathing, toileting, dressing, hygiene/grooming, and locomotion level  PARTICIPATION LIMITATIONS: meal prep, cleaning, laundry, shopping, and community activity  PERSONAL FACTORS: Age, Time since onset of injury/illness/exacerbation, and 3+ comorbidities: a-fib, history of pathological fractures, fall risk, bone mets, neuropathy of bil LE  are also affecting patient's functional outcome.   REHAB POTENTIAL: Good  CLINICAL DECISION MAKING: Evolving/moderate complexity  EVALUATION COMPLEXITY: Moderate   GOALS: Goals reviewed with patient? Yes  SHORT TERM GOALS: Target date: 09/06/23  Pt will be safe and ind with initial HEP Baseline: Goal status: MET 4/21  2.  Pt will be able to perform 5-10 reps of sit to stand without use of UE on thighs with controlled descent into chair Baseline:  Goal status: INITIAL  3.  Pt will report reduced thoracic spine pain by at least 25% Baseline:  Goal status: INITIAL  4.  Pt  will improve TUG test to 16 sec or less using SPC Baseline: 18.28 Goal status: INITIAL    LONG TERM GOALS: Target date: 10/04/23  Pt will demo safety and ind with HEP and understand the importance of ongoing compliance upon d/c from PT Baseline:  Goal status: INITIAL  2.  Pt will report reduced thoracic spine pain by at least 50%. Baseline:  Goal status: INITIAL  3.  Pt will cover at least 800' with [facility] in a to improve endurance for community outings. Baseline: 56' in with SPC Goal status: INITIAL  4.  Pt will improve mobility to allow him to safely reach for object on floor using light UE support Baseline:  Goal status: INITIAL  5.  Pt will improve speed with 10 meter walk test to 11.5 sec Baseline: 12.62 Goal status: INITIAL  6.  Pt will improve 5X STS to 16 sec or less without use of UE on chair to demo improved functional strength and reduced fall risk. Baseline:  Goal status: INITIAL  PLAN:  PT FREQUENCY: 2x/week  PT DURATION: 8 weeks  PLANNED INTERVENTIONS: 97110-Therapeutic exercises, 97530- Therapeutic activity, 97112- Neuromuscular re-education, 97535- Self Care, 40981- Manual therapy, [phone]- Gait training, Patient/Family education, Balance training, Stair training, Dry Needling, Joint mobilization, Spinal mobilization, Cryotherapy, and Moist heat.  PLAN FOR NEXT SESSION: Avoid supine secondary to back pain. Nustep, review HEP (PT added LE strength to already existing HEP from last episode of care), gait endurance, sit to stand, HS flexibility, functional body weight strength and trunk mobility - no heavy lifting due to bone mets   Jinx Mourning, PT 08/24/23 11:48 AM  [facility] [address], [address] [place], [place] [address] Phone # [phone] Fax [phone]

## 2023-08-29 ENCOUNTER — Encounter: Payer: Self-pay | Admitting: Physical Therapy

## 2023-08-29 ENCOUNTER — Ambulatory Visit: Admitting: Physical Therapy

## 2023-08-29 DIAGNOSIS — M6281 Muscle weakness (generalized): Secondary | ICD-10-CM | POA: Diagnosis not present

## 2023-08-29 DIAGNOSIS — R262 Difficulty in walking, not elsewhere classified: Secondary | ICD-10-CM

## 2023-08-29 DIAGNOSIS — R252 Cramp and spasm: Secondary | ICD-10-CM | POA: Diagnosis not present

## 2023-08-29 DIAGNOSIS — M5459 Other low back pain: Secondary | ICD-10-CM | POA: Diagnosis not present

## 2023-08-29 DIAGNOSIS — S22000A Wedge compression fracture of unspecified thoracic vertebra, initial encounter for closed fracture: Secondary | ICD-10-CM | POA: Diagnosis not present

## 2023-08-29 DIAGNOSIS — M542 Cervicalgia: Secondary | ICD-10-CM | POA: Diagnosis not present

## 2023-08-29 DIAGNOSIS — M25552 Pain in left hip: Secondary | ICD-10-CM | POA: Diagnosis not present

## 2023-08-29 DIAGNOSIS — C9000 Multiple myeloma not having achieved remission: Secondary | ICD-10-CM | POA: Diagnosis not present

## 2023-08-29 DIAGNOSIS — R293 Abnormal posture: Secondary | ICD-10-CM | POA: Diagnosis not present

## 2023-08-29 DIAGNOSIS — M546 Pain in thoracic spine: Secondary | ICD-10-CM | POA: Diagnosis not present

## 2023-08-29 NOTE — Therapy (Signed)
 OUTPATIENT PHYSICAL THERAPY ORTHOPEDIC TREATMENT   Patient Name: Travis [name] MRN: [id number] DOB:1950/10/20, 73 y.o., male Today's Date: 08/29/2023  END OF SESSION:  PT End of Session - 08/29/23 1111     Visit Number 5    Date for PT Re-Evaluation 10/04/23    Authorization Type [employer] MCR    Progress Note Due on Visit 10    PT Start Time 1105   Pt slightly late   PT Stop Time 1145    PT Time Calculation (min) 40 min    Activity Tolerance Patient tolerated treatment well    Behavior During Therapy WFL for tasks assessed/performed                Past Medical History:  Diagnosis Date   A-fib (HCC)    Arthritis    "comes and goes; mostly in hands, occasionally elbow" (04/05/2017)   Complication of anesthesia    "just wanted to sleep alot  for couple days S/P VARICOCELE OR" (04/05/2017)   DVT (deep venous thrombosis) (HCC)    LLE   Dysrhythmia    PVCs    GERD (gastroesophageal reflux disease)    History of hiatal hernia    History of radiation therapy    Thoracic Spine- 07/13/22-07/23/22-Dr. Retta Caster   Hypertension    Impaired glucose tolerance    Multiple myeloma (HCC)    Obesity (BMI 30-39.9) 07/26/2016   OSA on CPAP 07/26/2016   Mild with AHI 12.5/hr now on CPAP at 14cm H2O   PAF (paroxysmal atrial fibrillation) (HCC)    s/p PVI Ablation in 2018 // Flecainide  Rx // Echo 8/21: EF 60-65, no RWMA, mild LVH, normal RV SF, moderate RVE, mild BAE, trivial MR, mild dilation of aortic root (40 mm)   Pneumonia    "may have had walking pneumonia a few years ago" (04/05/2017)   Pre-diabetes    Thrombophlebitis    Past Surgical History:  Procedure Laterality Date   ATRIAL FIBRILLATION ABLATION  04/05/2017   ATRIAL FIBRILLATION ABLATION N/A 04/05/2017   Procedure: ATRIAL FIBRILLATION ABLATION;  Surgeon: Jolly Needle, MD;  Location: [facility];  Service: Cardiovascular;  Laterality: N/A;   BONE BIOPSY Right 12/17/2021   Procedure: BONE BIOPSY;  Surgeon:  Micheline Ahr, MD;  Location: [facility];  Service: Orthopedics;  Laterality: Right;   COMPLETE RIGHT HIP REPLACMENT  01/19/2019   EVALUATION UNDER ANESTHESIA WITH HEMORRHOIDECTOMY N/A 02/24/2017   Procedure: EXAM UNDER ANESTHESIA WITH  HEMORRHOIDECTOMY;  Surgeon: Candyce Champagne, MD;  Location: [facility];  Service: General;  Laterality: N/A;   EXCISIONAL TOTAL HIP ARTHROPLASTY WITH ANTIBIOTIC SPACERS Right 09/25/2020   Procedure: EXCISIONAL TOTAL HIP ARTHROPLASTY WITH ANTIBIOTIC SPACERS;  Surgeon: Claiborne Crew, MD;  Location: [facility];  Service: Orthopedics;  Laterality: Right;   FLEXIBLE SIGMOIDOSCOPY N/A 04/15/2015   Procedure:  UNSEDATED FLEXIBLE SIGMOIDOSCOPY;  Surgeon: Garrett Kallman, MD;  Location: [facility];  Service: Endoscopy;  Laterality: N/A;   HUMERUS IM NAIL Right 12/17/2021   Procedure: INTRAMEDULLARY (IM) NAIL HUMERAL;  Surgeon: Micheline Ahr, MD;  Location: [facility];  Service: Orthopedics;  Laterality: Right;   KNEE ARTHROSCOPY Left ~ 2016   POSTERIOR CERVICAL FUSION/FORAMINOTOMY N/A 06/04/2022   Procedure: Thoracic one - Thoracic Four Posterior lateral arthrodesis/ fusion and Thoracic two Corpectomy;  Surgeon: Audie Bleacher, MD;  Location: [facility];  Service: Neurosurgery;  Laterality: N/A;   REIMPLANTATION OF TOTAL HIP Right 12/25/2020   Procedure: REIMPLANTATION/REVISION OF RIGHT TOTAL  HIP VERSUS REPEAT IRRIGATION AND DEBRIDEMENT;  Surgeon: Claiborne Crew, MD;  Location: [facility];  Service: Orthopedics;  Laterality: Right;   TESTICLE SURGERY  1988   VARICOCELE   TONSILLECTOMY     VARICOSE VEIN SURGERY Bilateral    Patient Active Problem List   Diagnosis Date Noted   Pancreatic cyst 09/30/2022   Infection due to novel influenza A virus 09/29/2022   Persistent atrial fibrillation (HCC) 07/15/2022   Acute on chronic diastolic heart failure (HCC) 07/03/2022   Pulmonary infiltrates 07/01/2022   Acute respiratory failure with hypoxia (HCC) 07/01/2022    Hyponatremia 06/29/2022   History of thoracic surgery 06/29/2022   Acute DVT of left tibial vein (HCC) 06/21/2022   Hypokalemia 06/21/2022   Vitamin D  deficiency 05/30/2022   Other constipation 05/29/2022   Pathologic compression fracture of spine, initial encounter (HCC) 05/29/2022   Cord compression (HCC) 05/28/2022   Degenerative cervical disc 05/14/2022   Hypercoagulable state due to persistent atrial fibrillation (HCC) 01/11/2022   Multiple myeloma (HCC) 12/10/2021   Medication monitoring encounter 02/27/2021   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   Status post peripherally inserted central catheter (PICC) central line placement 11/06/2020   Drug rash with eosinophilia and systemic symptoms syndrome 11/06/2020   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   PAF (paroxysmal atrial fibrillation) (HCC)    GERD (gastroesophageal reflux disease) 03/29/2018   Hiatal hernia 03/29/2018   History of DVT (deep vein thrombosis) 03/29/2018   CAP (community acquired pneumonia) 03/29/2018   Osteoarthritis 03/29/2018   Neuropathy 08/29/2017   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   Paroxysmal atrial fibrillation with rapid ventricular response (HCC) 04/05/2017   Prolapsed internal hemorrhoids, grade 4, s/p ligation/pexy/hemorrhoidectomy x 2 02/24/2017 02/24/2017   OSA (obstructive sleep apnea) 07/26/2016   Obesity (BMI 30-39.9) 07/26/2016   Atrial fibrillation with RVR (HCC)    Essential hypertension    Chest pain at rest     PCP: Charlene Conners, MD  REFERRING PROVIDER: Gery Kubas, NP  REFERRING DIAG: R53.81 (ICD-10-CM) - Physical deconditioning C90.00 (ICD-10-CM) - Multiple myeloma myelomatosis (HCC)  Rationale for Evaluation and Treatment: Rehabilitation  THERAPY DIAG:  Muscle weakness (generalized)  Other low back pain  Difficulty in walking, not elsewhere classified  ONSET DATE: chronic  SUBJECTIVE:                                                                                                                                                                                            SUBJECTIVE STATEMENT: I am walking about 1/3 mile every day in my neighborhood.  I am tapering off the steroids.  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 Yes Pain location: bil knees, Rt hip, upper back, neuropathy in Lt foot > Rt PAIN TYPE: aching, burning, and numbness (foot) Pain description: intermittent and constant  Aggravating factors: exercise, walking - makes me sore Relieving factors: the band exercises help my back   PRECAUTIONS: Other: NO HEAVY RESISTANCE OR WEIGHT LIFTING GIVEN CANCER WITH BONE METS, FALL RISK  RED FLAGS: Cancer with bone mets, fall risk    WEIGHT BEARING RESTRICTIONS: No  FALLS:  Has patient fallen in last 6 months? Yes. Number of falls 0  LIVING ENVIRONMENT: Lives with: lives with their family and lives with their spouse Lives in: House/apartment Stairs: Yes; Internal: 14 steps; on left going up and External: 3 steps; on right going up Has following equipment at home: Single point cane and Walker - 4 wheeled - rollator   OCCUPATION: semi-retired, attorney   PLOF: Independent with household mobility with device   PATIENT GOALS: LE strength, loosen up back  NEXT MD VISIT: tomorrow  OBJECTIVE:  Note: Objective measures were completed at Evaluation unless  otherwise noted.   PATIENT SURVEYS:  Objective measures for fall risk and deconditioning diagnosis - see functional tests  COGNITION: Overall cognitive status: Within functional limits for tasks assessed     SENSATION: Whole foot Rt>Lt numbness related to neuropathy   POSTURE: rounded shoulders, forward head, decreased lumbar lordosis, and increased thoracic kyphosis   TRUNK ROM:    Flexes trunk with fingers to mid-shin with knees bent and holds onto counter Trunk rotation bil 50% Neck rotation bil 50% Neck extension - remains flexed to 5 deg  LOWER EXTREMITY MMT:   Rt hip 4-/5 Lt hip 4/5 Bil knees 4-/5   FUNCTIONAL TESTS:  5 times sit to stand: 19.14 heavy use of UE on chair to rise and control descent Timed up and go (TUG): 18.28 with SPC and use of hands on chair  3 minute walk test: 86' with SCP 10 meter walk test: 12.62 sec with SPC  GAIT: Distance walked: 401 Assistive device utilized: Single point cane Level of assistance: Modified independence Comments: lacks trunk rotation, wide BOS  TREATMENT DATE:  08/29/23: Nustep L 5 x 6 min Heel/toe raises with UE support x10 Standing march with UE support x20 Sidestepping along barre yellow loop at ankles x5 laps back/forth Seated march yellow loop around knees x20 - chair + 2 pads Sit to stand chair + pad x10 - arms lightly on bil arm rests Seated HS curl black loop band 2x10 Seated clam black loop 2x10 Standing cable bil row 15lb x10 Single arm cable row with ipsilateral trunk rotation 10lb x10 each UE Backward resistance walking x 5 reps 15lb with PT providing close supervision for safety LAQ 5lb 2x10 2" fwd step ups with bil UE support 1x10 each LE 2" lateral hip hike with bil UE support x10 each LE  08/24/23 (10 min late) Nustep L 5  x 5 min Seated blue tband row x10, then single arm row with ipsilateral rotation x10 alt directions LAQ with ball squeeze -  5lb 2x10 5 sec hold Seated hamstring curl with  black loop band 2x10 Seated clam black loop 2x10 Sit to stand from chair with UE assist on chair x 10 PPT with alt LE march x20 LTR x20 Bil shoulder extension pulling blue tband down to table - PT anchoring above head  08/22/23: Discussion of how Pt was feeling - more weak, stiff and numb than usual Seated blue tband row x10, then single arm row with ipsilateral rotation x10 alt directions Seated banded trunk extension purple power cord x10 LAQ with ball squeeze - 3lb 10x5", 5lb 1x10 Seated hamstring curl with black loop band 2x10 Seated clam black loop 2x10 Sit to stand from chair with UE assist on chair x 10 Supine PPT x20 PPT with alt LE march x20 LTR x20 Bil shoulder extension pulling blue tband down to table - PT anchoring above head Supine HS curl feet on red physioball x20 - PT cued dig heels into ball Supine hooklying UE ball press into red ball against thighs 10x5" Supine hooklying yellow weighted ball diagonal UE chop/lift x10 each way                                                                                                                   PATIENT EDUCATION:  Education details: [email or web address] Person educated: Patient Education method: Programmer, multimedia, Facilities manager, Verbal cues, and Handouts Education comprehension: verbalized understanding and returned demonstration  HOME EXERCISE PROGRAM: Access Code: [email or web address] URL: [email or web address] Date: 08/09/2023 Prepared by: Minor Amble Rhyann Berton  Exercises - Seated Cervical Rotation AROM  - 1 x daily - 7 x weekly - 1 sets - 10 reps - 10 hold - Seated Cervical Sidebending AROM  - 1 x daily - 7 x weekly - 1 sets - 10 reps - 5 hold - Seated Cervical Extension AROM  - 1 x daily - 7 x weekly - 1 sets - 10 reps - 10 hold - Seated Scapular Retraction  - 1 x daily - 7 x weekly - 1-3 sets - 10 reps - 2-3 sec hold - Sidelying Thoracic Rotation with Open Book  - 1 x daily - 7 x weekly - 2 sets - 5 reps - Seated Thoracic  Extension Arms Overhead  - 1 x daily - 7 x weekly - 1-2 sets - 10 reps - 2-3 sec hold - Seated Trunk Rotation  - 1 x daily - 7 x weekly - 1-2 sets - 10 reps - 2-3 sec hold - Seated Diagonal Chops with Medicine Ball  - 1 x daily - 7 x weekly - 1-2 sets - 10 reps - 2-3 hold - Seated Long Arc Quad  - 1 x daily - 7 x weekly - 3 sets - 10 reps - Seated March  - 1 x daily - 7 x weekly - 3 sets - 10 reps - Seated Hip External Rotation AROM  -  1 x daily - 7 x weekly - 3 sets - 10 reps - Supine Hamstring Stretch with Strap  - 1 x daily - 7 x weekly - 1 sets - 3 reps - 20 sec hold - Supine ITB Stretch with Strap  - 1 x daily - 7 x weekly - 1 sets - 3 reps - 20 sec hold - Seated Piriformis Stretch  - 1 x daily - 7 x weekly - 1 sets - 3 reps - 30 sec hold - Seated Hip Adduction Squeeze with Ball  - 1 x daily - 7 x weekly - 3 sets - 10 reps - 2-3 sec hold - Seated 3 Way Exercise [employer] Out Stretch  - 1 x daily - 7 x weekly - 1 sets - 10 reps - Seated Long Arc Quad with Ankle Weight  - 1 x daily - 7 x weekly - 2 sets - 10 reps - Seated Hip Flexion March with Ankle Weights  - 1 x daily - 7 x weekly - 2 sets - 10 reps - Seated Hip External Rotation AROM  - 1 x daily - 7 x weekly - 2 sets - 10 reps - Seated Heel Toe Raises  - 1 x daily - 7 x weekly - 2 sets - 10 reps - Standing Hip Abduction with Counter Support  - 1 x daily - 7 x weekly - 2 sets - 10 reps - Standing Hip Extension with Counter Support  - 1 x daily - 7 x weekly - 2 sets - 10 reps - Heel Raises with Counter Support  - 1 x daily - 7 x weekly - 2 sets - 10 reps - Sit to Stand with Armchair  - 5 x daily - 7 x weekly - 1 sets - 3-5 [id number]  ASSESSMENT:  CLINICAL IMPRESSION:  Patient presents ready to work.  He has been walking up to 1/3 mile daily.  He is doing some walking without his cane but feels his strength/balance is not where it needs to be to do this consistently.  He was able to advance to some standing UE/LE strength today  with excellent response and tolerance.  He likes the pace during sessions without rest breaks and alternating between UE/LE sets and sitting/standing positions.  He is encouraged by his progress.  He is tapering steroids and has another infusion at [facility] this Friday.   OBJECTIVE IMPAIRMENTS: Abnormal gait, cardiopulmonary status limiting activity, decreased activity tolerance, decreased balance, decreased coordination, decreased endurance, decreased mobility, difficulty walking, decreased ROM, decreased strength, hypomobility, increased edema, impaired flexibility, impaired sensation, impaired UE functional use, improper body mechanics, postural dysfunction, and pain.   ACTIVITY LIMITATIONS: carrying, lifting, bending, sitting, standing, squatting, sleeping, stairs, transfers, bed mobility, bathing, toileting, dressing, hygiene/grooming, and locomotion level  PARTICIPATION LIMITATIONS: meal prep, cleaning, laundry, shopping, and community activity  PERSONAL FACTORS: Age, Time since onset of injury/illness/exacerbation, and 3+ comorbidities: a-fib, history of pathological fractures, fall risk, bone mets, neuropathy of bil LE  are also affecting patient's functional outcome.   REHAB POTENTIAL: Good  CLINICAL DECISION MAKING: Evolving/moderate complexity  EVALUATION COMPLEXITY: Moderate   GOALS: Goals reviewed with patient? Yes  SHORT TERM GOALS: Target date: 09/06/23  Pt will be safe and ind with initial HEP Baseline: Goal status: MET 4/21  2.  Pt will be able to perform 5-10 reps of sit to stand without use of UE on thighs with controlled descent into chair Baseline:  Goal status: INITIAL  3.  Pt  will report reduced thoracic spine pain by at least 25% Baseline:  Goal status: INITIAL  4.  Pt will improve TUG test to 16 sec or less using SPC Baseline: 18.28 Goal status: INITIAL    LONG TERM GOALS: Target date: 10/04/23  Pt will demo safety and ind with HEP and understand the  importance of ongoing compliance upon d/c from PT Baseline:  Goal status: INITIAL  2.  Pt will report reduced thoracic spine pain by at least 50%. Baseline:  Goal status: INITIAL  3.  Pt will cover at least 800' with [facility] in a to improve endurance for community outings. Baseline: 65' in with SPC Goal status: INITIAL  4.  Pt will improve mobility to allow him to safely reach for object on floor using light UE support Baseline:  Goal status: INITIAL  5.  Pt will improve speed with 10 meter walk test to 11.5 sec Baseline: 12.62 Goal status: INITIAL  6.  Pt will improve 5X STS to 16 sec or less without use of UE on chair to demo improved functional strength and reduced fall risk. Baseline:  Goal status: INITIAL  PLAN:  PT FREQUENCY: 2x/week  PT DURATION: 8 weeks  PLANNED INTERVENTIONS: 97110-Therapeutic exercises, 97530- Therapeutic activity, 97112- Neuromuscular re-education, 97535- Self Care, 72536- Manual therapy, [phone]- Gait training, Patient/Family education, Balance training, Stair training, Dry Needling, Joint mobilization, Spinal mobilization, Cryotherapy, and Moist heat.  PLAN FOR NEXT SESSION: Avoid supine secondary to back pain. Retest STGs, Nustep, review HEP (PT added LE strength to already existing HEP from last episode of care), gait endurance, sit to stand, HS flexibility, functional body weight strength and trunk mobility - no heavy lifting due to bone mets   Raynell Caller, PT 08/29/23 11:47 AM   [facility] [address] [place], [place] [address] Phone # [phone] Fax [phone]

## 2023-08-31 ENCOUNTER — Ambulatory Visit

## 2023-08-31 DIAGNOSIS — M25552 Pain in left hip: Secondary | ICD-10-CM

## 2023-08-31 DIAGNOSIS — R252 Cramp and spasm: Secondary | ICD-10-CM | POA: Diagnosis not present

## 2023-08-31 DIAGNOSIS — M546 Pain in thoracic spine: Secondary | ICD-10-CM | POA: Diagnosis not present

## 2023-08-31 DIAGNOSIS — S22000A Wedge compression fracture of unspecified thoracic vertebra, initial encounter for closed fracture: Secondary | ICD-10-CM

## 2023-08-31 DIAGNOSIS — M5459 Other low back pain: Secondary | ICD-10-CM

## 2023-08-31 DIAGNOSIS — R293 Abnormal posture: Secondary | ICD-10-CM

## 2023-08-31 DIAGNOSIS — R262 Difficulty in walking, not elsewhere classified: Secondary | ICD-10-CM | POA: Diagnosis not present

## 2023-08-31 DIAGNOSIS — M6281 Muscle weakness (generalized): Secondary | ICD-10-CM | POA: Diagnosis not present

## 2023-08-31 DIAGNOSIS — C9000 Multiple myeloma not having achieved remission: Secondary | ICD-10-CM

## 2023-08-31 DIAGNOSIS — M542 Cervicalgia: Secondary | ICD-10-CM | POA: Diagnosis not present

## 2023-08-31 NOTE — Therapy (Signed)
 OUTPATIENT PHYSICAL THERAPY ORTHOPEDIC TREATMENT   Patient Name: Travis [name] MRN: [id number] DOB:1951/02/12, 73 y.o., male Today's Date: 08/31/2023  END OF SESSION:  PT End of Session - 08/31/23 1106     Visit Number 6    Date for PT Re-Evaluation 10/04/23    Authorization Type [employer] MCR    Progress Note Due on Visit 10    PT Start Time 1100    PT Stop Time 1145    PT Time Calculation (min) 45 min    Activity Tolerance Patient tolerated treatment well    Behavior During Therapy WFL for tasks assessed/performed                Past Medical History:  Diagnosis Date   A-fib (HCC)    Arthritis    "comes and goes; mostly in hands, occasionally elbow" (04/05/2017)   Complication of anesthesia    "just wanted to sleep alot  for couple days S/P VARICOCELE OR" (04/05/2017)   DVT (deep venous thrombosis) (HCC)    LLE   Dysrhythmia    PVCs    GERD (gastroesophageal reflux disease)    History of hiatal hernia    History of radiation therapy    Thoracic Spine- 07/13/22-07/23/22-Dr. Retta Caster   Hypertension    Impaired glucose tolerance    Multiple myeloma (HCC)    Obesity (BMI 30-39.9) 07/26/2016   OSA on CPAP 07/26/2016   Mild with AHI 12.5/hr now on CPAP at 14cm H2O   PAF (paroxysmal atrial fibrillation) (HCC)    s/p PVI Ablation in 2018 // Flecainide  Rx // Echo 8/21: EF 60-65, no RWMA, mild LVH, normal RV SF, moderate RVE, mild BAE, trivial MR, mild dilation of aortic root (40 mm)   Pneumonia    "may have had walking pneumonia a few years ago" (04/05/2017)   Pre-diabetes    Thrombophlebitis    Past Surgical History:  Procedure Laterality Date   ATRIAL FIBRILLATION ABLATION  04/05/2017   ATRIAL FIBRILLATION ABLATION N/A 04/05/2017   Procedure: ATRIAL FIBRILLATION ABLATION;  Surgeon: Jolly Needle, MD;  Location: [facility];  Service: Cardiovascular;  Laterality: N/A;   BONE BIOPSY Right 12/17/2021   Procedure: BONE BIOPSY;  Surgeon: Micheline Ahr, MD;   Location: [facility];  Service: Orthopedics;  Laterality: Right;   COMPLETE RIGHT HIP REPLACMENT  01/19/2019   EVALUATION UNDER ANESTHESIA WITH HEMORRHOIDECTOMY N/A 02/24/2017   Procedure: EXAM UNDER ANESTHESIA WITH  HEMORRHOIDECTOMY;  Surgeon: Candyce Champagne, MD;  Location: [facility];  Service: General;  Laterality: N/A;   EXCISIONAL TOTAL HIP ARTHROPLASTY WITH ANTIBIOTIC SPACERS Right 09/25/2020   Procedure: EXCISIONAL TOTAL HIP ARTHROPLASTY WITH ANTIBIOTIC SPACERS;  Surgeon: Claiborne Crew, MD;  Location: [facility];  Service: Orthopedics;  Laterality: Right;   FLEXIBLE SIGMOIDOSCOPY N/A 04/15/2015   Procedure:  UNSEDATED FLEXIBLE SIGMOIDOSCOPY;  Surgeon: Garrett Kallman, MD;  Location: [facility];  Service: Endoscopy;  Laterality: N/A;   HUMERUS IM NAIL Right 12/17/2021   Procedure: INTRAMEDULLARY (IM) NAIL HUMERAL;  Surgeon: Micheline Ahr, MD;  Location: [facility];  Service: Orthopedics;  Laterality: Right;   KNEE ARTHROSCOPY Left ~ 2016   POSTERIOR CERVICAL FUSION/FORAMINOTOMY N/A 06/04/2022   Procedure: Thoracic one - Thoracic Four Posterior lateral arthrodesis/ fusion and Thoracic two Corpectomy;  Surgeon: Audie Bleacher, MD;  Location: [facility];  Service: Neurosurgery;  Laterality: N/A;   REIMPLANTATION OF TOTAL HIP Right 12/25/2020   Procedure: REIMPLANTATION/REVISION OF RIGHT TOTAL HIP VERSUS REPEAT IRRIGATION  AND DEBRIDEMENT;  Surgeon: Claiborne Crew, MD;  Location: [facility];  Service: Orthopedics;  Laterality: Right;   TESTICLE SURGERY  1988   VARICOCELE   TONSILLECTOMY     VARICOSE VEIN SURGERY Bilateral    Patient Active Problem List   Diagnosis Date Noted   Pancreatic cyst 09/30/2022   Infection due to novel influenza A virus 09/29/2022   Persistent atrial fibrillation (HCC) 07/15/2022   Acute on chronic diastolic heart failure (HCC) 07/03/2022   Pulmonary infiltrates 07/01/2022   Acute respiratory failure with hypoxia (HCC) 07/01/2022   Hyponatremia  06/29/2022   History of thoracic surgery 06/29/2022   Acute DVT of left tibial vein (HCC) 06/21/2022   Hypokalemia 06/21/2022   Vitamin D  deficiency 05/30/2022   Other constipation 05/29/2022   Pathologic compression fracture of spine, initial encounter (HCC) 05/29/2022   Cord compression (HCC) 05/28/2022   Degenerative cervical disc 05/14/2022   Hypercoagulable state due to persistent atrial fibrillation (HCC) 01/11/2022   Multiple myeloma (HCC) 12/10/2021   Medication monitoring encounter 02/27/2021   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   Status post peripherally inserted central catheter (PICC) central line placement 11/06/2020   Drug rash with eosinophilia and systemic symptoms syndrome 11/06/2020   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   PAF (paroxysmal atrial fibrillation) (HCC)    GERD (gastroesophageal reflux disease) 03/29/2018   Hiatal hernia 03/29/2018   History of DVT (deep vein thrombosis) 03/29/2018   CAP (community acquired pneumonia) 03/29/2018   Osteoarthritis 03/29/2018   Neuropathy 08/29/2017   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   Paroxysmal atrial fibrillation with rapid ventricular response (HCC) 04/05/2017   Prolapsed internal hemorrhoids, grade 4, s/p ligation/pexy/hemorrhoidectomy x 2 02/24/2017 02/24/2017   OSA (obstructive sleep apnea) 07/26/2016   Obesity (BMI 30-39.9) 07/26/2016   Atrial fibrillation with RVR (HCC)    Essential hypertension    Chest pain at rest     PCP: Charlene Conners, MD  REFERRING PROVIDER: Gery Kubas, NP  REFERRING DIAG: R53.81 (ICD-10-CM) - Physical deconditioning C90.00 (ICD-10-CM) - Multiple myeloma myelomatosis (HCC)  Rationale for Evaluation and Treatment: Rehabilitation  THERAPY DIAG:  Muscle weakness (generalized)  Other low back pain  Cramp and spasm  Difficulty in walking, not elsewhere classified  Pain in left hip  Compression fracture of thoracic vertebra,  unspecified thoracic vertebral level, initial encounter (HCC)  Abnormal posture  Multiple myeloma not having achieved remission (HCC)  Pain in thoracic spine  Cervicalgia  ONSET DATE: chronic  SUBJECTIVE:  SUBJECTIVE STATEMENT: Patient reports he is feeling weak today.  I just feel tight and like my legs feel like they are going to give way.    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 Yes Pain location: bil knees, Rt hip, upper back, neuropathy in Lt foot > Rt PAIN TYPE: aching, burning, and numbness (foot) Pain description: intermittent and constant  Aggravating factors: exercise, walking - makes me sore Relieving factors: the band exercises help my back   PRECAUTIONS: Other: NO HEAVY RESISTANCE OR WEIGHT LIFTING GIVEN CANCER WITH BONE METS, FALL RISK  RED FLAGS: Cancer with bone mets, fall risk    WEIGHT BEARING RESTRICTIONS: No  FALLS:  Has patient fallen in last 6 months? Yes. Number of falls 0  LIVING ENVIRONMENT: Lives with: lives with their family and lives with their spouse Lives in: House/apartment Stairs: Yes; Internal: 14 steps; on left going up and External: 3 steps; on right going up Has following equipment at home: Single point cane  and Walker - 4 wheeled - rollator   OCCUPATION: semi-retired, attorney   PLOF: Independent with household mobility with device   PATIENT GOALS: LE strength, loosen up back  NEXT MD VISIT: tomorrow  OBJECTIVE:  Note: Objective measures were completed at Evaluation unless otherwise noted.   PATIENT SURVEYS:  Objective measures for fall risk and deconditioning diagnosis - see functional tests  COGNITION: Overall cognitive status: Within functional limits for tasks assessed     SENSATION: Whole foot Rt>Lt numbness related to neuropathy   POSTURE: rounded shoulders, forward head, decreased lumbar lordosis, and increased thoracic kyphosis   TRUNK ROM:    Flexes trunk with fingers to mid-shin with knees bent and holds onto counter Trunk rotation bil 50% Neck rotation bil 50% Neck extension - remains flexed to 5 deg  LOWER EXTREMITY MMT:   Rt hip 4-/5 Lt hip 4/5 Bil knees 4-/5   FUNCTIONAL TESTS:  5 times sit to stand: 19.14 heavy use of UE on chair to rise and control descent Timed up and go (TUG): 18.28 with SPC and use of hands on chair  3 minute walk test: 67' with SCP 10 meter walk test: 12.62 sec with SPC  GAIT: Distance walked: 401 Assistive device utilized: Single point cane Level of assistance: Modified independence Comments: lacks trunk rotation, wide BOS  TREATMENT DATE:  08/31/23: Nustep L 5 x 8 min Heel/toe raises with UE support x10 Standing march with UE support x20 Sidestepping along barre yellow loop at ankles x5 laps back/forth Sit to stand chair + pad x10 - arms lightly on bil arm rests Seated march with 5 lb ankle weights x20  Rocker board x 2 min Standing alternating tap ups on 6" step, then x 20 on 8" step Seated LAQ x 20 each LE Seated HS curl blue band 2x10 Seated clam yellow loop 2x10 Seated bil row with blue band 2 x10 4" step : heel taps 2 x 10  each LE Holding 5 lb dumbbell in each hand with opposite knee lift 2 x 10 each side 2"  lateral hip hike with bil UE support x10 each LE  08/29/23: Nustep L 5 x 6 min Heel/toe raises with UE support x10 Standing march with UE support x20 Sidestepping along barre yellow loop at ankles x5 laps back/forth Seated march yellow loop around knees x20 - chair + 2 pads Sit to stand chair + pad x10 - arms lightly on bil arm rests Seated HS curl black loop band 2x10 Seated clam black loop 2x10 Standing cable bil row 15lb x10 Single arm cable row with ipsilateral trunk rotation 10lb x10 each UE Backward resistance walking x 5 reps 15lb with PT providing close supervision for safety LAQ 5lb 2x10 2" fwd step ups with bil UE support 1x10 each LE 2" lateral hip hike with bil UE support x10 each LE  08/24/23 (10 min late) Nustep L 5 x 5 min Seated blue tband row x10, then single arm row with ipsilateral rotation x10 alt directions LAQ with ball squeeze -  5lb 2x10 5 sec hold Seated hamstring curl with black loop band 2x10 Seated clam black loop 2x10 Sit to stand from chair with UE assist on chair x 10 PPT with alt LE march x20 LTR x20 Bil shoulder extension pulling blue tband down to table - PT anchoring above head                                                                                                                    PATIENT EDUCATION:  Education details: [email or web address] Person educated: Patient Education method: Programmer, multimedia, Facilities manager, Verbal cues, and Handouts Education comprehension: verbalized understanding and returned demonstration  HOME EXERCISE PROGRAM: Access Code: [email or web address] URL: [email or web address] Date: 08/09/2023 Prepared by: Minor Amble Beuhring  Exercises - Seated Cervical Rotation AROM  - 1 x daily - 7 x weekly - 1 sets - 10 reps - 10 hold - Seated Cervical Sidebending AROM  - 1 x daily - 7 x weekly - 1 sets - 10 reps - 5 hold - Seated Cervical Extension AROM  - 1 x daily - 7 x weekly - 1 sets - 10 reps - 10 hold - Seated Scapular Retraction   - 1 x daily - 7 x weekly - 1-3 sets - 10 reps - 2-3 sec hold - Sidelying Thoracic Rotation with Open Book  - 1 x daily - 7 x weekly - 2 sets - 5 reps - Seated Thoracic Extension Arms Overhead  - 1 x daily - 7 x weekly - 1-2 sets - 10 reps - 2-3 sec hold - Seated Trunk Rotation  - 1 x daily - 7 x weekly - 1-2 sets - 10 reps - 2-3 sec hold - Seated Diagonal Chops with Medicine Ball  - 1 x daily - 7 x weekly - 1-2 sets - 10 reps - 2-3 hold - Seated Long Arc Quad  - 1 x daily - 7 x weekly - 3 sets -  10 reps - Seated March  - 1 x daily - 7 x weekly - 3 sets - 10 reps - Seated Hip External Rotation AROM  - 1 x daily - 7 x weekly - 3 sets - 10 reps - Supine Hamstring Stretch with Strap  - 1 x daily - 7 x weekly - 1 sets - 3 reps - 20 sec hold - Supine ITB Stretch with Strap  - 1 x daily - 7 x weekly - 1 sets - 3 reps - 20 sec hold - Seated Piriformis Stretch  - 1 x daily - 7 x weekly - 1 sets - 3 reps - 30 sec hold - Seated Hip Adduction Squeeze with Ball  - 1 x daily - 7 x weekly - 3 sets - 10 reps - 2-3 sec hold - Seated 3 Way Exercise [employer] Out Stretch  - 1 x daily - 7 x weekly - 1 sets - 10 reps - Seated Long Arc Quad with Ankle Weight  - 1 x daily - 7 x weekly - 2 sets - 10 reps - Seated Hip Flexion March with Ankle Weights  - 1 x daily - 7 x weekly - 2 sets - 10 reps - Seated Hip External Rotation AROM  - 1 x daily - 7 x weekly - 2 sets - 10 reps - Seated Heel Toe Raises  - 1 x daily - 7 x weekly - 2 sets - 10 reps - Standing Hip Abduction with Counter Support  - 1 x daily - 7 x weekly - 2 sets - 10 reps - Standing Hip Extension with Counter Support  - 1 x daily - 7 x weekly - 2 sets - 10 reps - Heel Raises with Counter Support  - 1 x daily - 7 x weekly - 2 sets - 10 reps - Sit to Stand with Armchair  - 5 x daily - 7 x weekly - 1 sets - 3-5 [id number]  ASSESSMENT:  CLINICAL IMPRESSION: Travis [name] was feeling less energetic today and c/o his legs feeling weak.  He was able to complete  all tasks, however, with no need for significant rest breaks.   He is continuing to taper steroids and has another infusion at [facility] this Friday. He would benefit from continuing skilled PT to progress toward final goals.    OBJECTIVE IMPAIRMENTS: Abnormal gait, cardiopulmonary status limiting activity, decreased activity tolerance, decreased balance, decreased coordination, decreased endurance, decreased mobility, difficulty walking, decreased ROM, decreased strength, hypomobility, increased edema, impaired flexibility, impaired sensation, impaired UE functional use, improper body mechanics, postural dysfunction, and pain.   ACTIVITY LIMITATIONS: carrying, lifting, bending, sitting, standing, squatting, sleeping, stairs, transfers, bed mobility, bathing, toileting, dressing, hygiene/grooming, and locomotion level  PARTICIPATION LIMITATIONS: meal prep, cleaning, laundry, shopping, and community activity  PERSONAL FACTORS: Age, Time since onset of injury/illness/exacerbation, and 3+ comorbidities: a-fib, history of pathological fractures, fall risk, bone mets, neuropathy of bil LE  are also affecting patient's functional outcome.   REHAB POTENTIAL: Good  CLINICAL DECISION MAKING: Evolving/moderate complexity  EVALUATION COMPLEXITY: Moderate   GOALS: Goals reviewed with patient? Yes  SHORT TERM GOALS: Target date: 09/06/23  Pt will be safe and ind with initial HEP Baseline: Goal status: MET 4/21  2.  Pt will be able to perform 5-10 reps of sit to stand without use of UE on thighs with controlled descent into chair Baseline:  Goal status: MET 08/31/23  3.  Pt will report reduced thoracic spine pain by at least  25% Baseline:  Goal status: MET 08/31/23  4.  Pt will improve TUG test to 16 sec or less using SPC Baseline: 18.28 Goal status: In Progress    LONG TERM GOALS: Target date: 10/04/23  Pt will demo safety and ind with HEP and understand the importance of ongoing compliance upon  d/c from PT Baseline:  Goal status: INITIAL  2.  Pt will report reduced thoracic spine pain by at least 50%. Baseline:  Goal status: INITIAL  3.  Pt will cover at least 800' with [facility] in a to improve endurance for community outings. Baseline: 9' in with SPC Goal status: INITIAL  4.  Pt will improve mobility to allow him to safely reach for object on floor using light UE support Baseline:  Goal status: INITIAL  5.  Pt will improve speed with 10 meter walk test to 11.5 sec Baseline: 12.62 Goal status: INITIAL  6.  Pt will improve 5X STS to 16 sec or less without use of UE on chair to demo improved functional strength and reduced fall risk. Baseline:  Goal status: INITIAL  PLAN:  PT FREQUENCY: 2x/week  PT DURATION: 8 weeks  PLANNED INTERVENTIONS: 97110-Therapeutic exercises, 97530- Therapeutic activity, 97112- Neuromuscular re-education, 97535- Self Care, 16109- Manual therapy, [phone]- Gait training, Patient/Family education, Balance training, Stair training, Dry Needling, Joint mobilization, Spinal mobilization, Cryotherapy, and Moist heat.  PLAN FOR NEXT SESSION: Avoid supine secondary to back pain. Nustep, review HEP (PT added LE strength to already existing HEP from last episode of care), gait endurance, sit to stand, HS flexibility, functional body weight strength and trunk mobility - no heavy lifting due to bone mets   Richrd Kuzniar B. Zenab Gronewold, PT 08/31/23 11:44 AM [facility] [address] [place], [place] [address] Phone # [phone] Fax [phone]

## 2023-09-02 DIAGNOSIS — D472 Monoclonal gammopathy: Secondary | ICD-10-CM | POA: Diagnosis not present

## 2023-09-02 DIAGNOSIS — Z92850 Personal history of chimeric antigen receptor t-cell therapy: Secondary | ICD-10-CM | POA: Diagnosis not present

## 2023-09-02 DIAGNOSIS — C9000 Multiple myeloma not having achieved remission: Secondary | ICD-10-CM | POA: Diagnosis not present

## 2023-09-02 DIAGNOSIS — D801 Nonfamilial hypogammaglobulinemia: Secondary | ICD-10-CM | POA: Diagnosis not present

## 2023-09-05 ENCOUNTER — Ambulatory Visit

## 2023-09-05 DIAGNOSIS — R262 Difficulty in walking, not elsewhere classified: Secondary | ICD-10-CM | POA: Diagnosis not present

## 2023-09-05 DIAGNOSIS — M542 Cervicalgia: Secondary | ICD-10-CM | POA: Insufficient documentation

## 2023-09-05 DIAGNOSIS — M546 Pain in thoracic spine: Secondary | ICD-10-CM | POA: Diagnosis not present

## 2023-09-05 DIAGNOSIS — S22000A Wedge compression fracture of unspecified thoracic vertebra, initial encounter for closed fracture: Secondary | ICD-10-CM | POA: Diagnosis not present

## 2023-09-05 DIAGNOSIS — M6281 Muscle weakness (generalized): Secondary | ICD-10-CM | POA: Insufficient documentation

## 2023-09-05 DIAGNOSIS — R293 Abnormal posture: Secondary | ICD-10-CM | POA: Insufficient documentation

## 2023-09-05 DIAGNOSIS — M5459 Other low back pain: Secondary | ICD-10-CM | POA: Diagnosis not present

## 2023-09-05 DIAGNOSIS — R252 Cramp and spasm: Secondary | ICD-10-CM | POA: Insufficient documentation

## 2023-09-05 DIAGNOSIS — M25552 Pain in left hip: Secondary | ICD-10-CM | POA: Diagnosis not present

## 2023-09-05 DIAGNOSIS — C9000 Multiple myeloma not having achieved remission: Secondary | ICD-10-CM | POA: Insufficient documentation

## 2023-09-05 NOTE — Therapy (Signed)
 OUTPATIENT PHYSICAL THERAPY ORTHOPEDIC TREATMENT   Patient Name: Travis [name] MRN: [id number] DOB:August 11, 1950, 73 y.o., male Today's Date: 09/05/2023  END OF SESSION:  PT End of Session - 09/05/23 1104     Visit Number 7    Date for PT Re-Evaluation 10/04/23    Authorization Type [employer] MCR    Progress Note Due on Visit 10    PT Start Time 1100    PT Stop Time 1145    PT Time Calculation (min) 45 min    Activity Tolerance Patient tolerated treatment well    Behavior During Therapy WFL for tasks assessed/performed                Past Medical History:  Diagnosis Date   A-fib (HCC)    Arthritis    "comes and goes; mostly in hands, occasionally elbow" (04/05/2017)   Complication of anesthesia    "just wanted to sleep alot  for couple days S/P VARICOCELE OR" (04/05/2017)   DVT (deep venous thrombosis) (HCC)    LLE   Dysrhythmia    PVCs    GERD (gastroesophageal reflux disease)    History of hiatal hernia    History of radiation therapy    Thoracic Spine- 07/13/22-07/23/22-Dr. Retta Caster   Hypertension    Impaired glucose tolerance    Multiple myeloma (HCC)    Obesity (BMI 30-39.9) 07/26/2016   OSA on CPAP 07/26/2016   Mild with AHI 12.5/hr now on CPAP at 14cm H2O   PAF (paroxysmal atrial fibrillation) (HCC)    s/p PVI Ablation in 2018 // Flecainide  Rx // Echo 8/21: EF 60-65, no RWMA, mild LVH, normal RV SF, moderate RVE, mild BAE, trivial MR, mild dilation of aortic root (40 mm)   Pneumonia    "may have had walking pneumonia a few years ago" (04/05/2017)   Pre-diabetes    Thrombophlebitis    Past Surgical History:  Procedure Laterality Date   ATRIAL FIBRILLATION ABLATION  04/05/2017   ATRIAL FIBRILLATION ABLATION N/A 04/05/2017   Procedure: ATRIAL FIBRILLATION ABLATION;  Surgeon: Jolly Needle, MD;  Location: [facility];  Service: Cardiovascular;  Laterality: N/A;   BONE BIOPSY Right 12/17/2021   Procedure: BONE BIOPSY;  Surgeon: Micheline Ahr, MD;   Location: [facility];  Service: Orthopedics;  Laterality: Right;   COMPLETE RIGHT HIP REPLACMENT  01/19/2019   EVALUATION UNDER ANESTHESIA WITH HEMORRHOIDECTOMY N/A 02/24/2017   Procedure: EXAM UNDER ANESTHESIA WITH  HEMORRHOIDECTOMY;  Surgeon: Candyce Champagne, MD;  Location: [facility];  Service: General;  Laterality: N/A;   EXCISIONAL TOTAL HIP ARTHROPLASTY WITH ANTIBIOTIC SPACERS Right 09/25/2020   Procedure: EXCISIONAL TOTAL HIP ARTHROPLASTY WITH ANTIBIOTIC SPACERS;  Surgeon: Claiborne Crew, MD;  Location: [facility];  Service: Orthopedics;  Laterality: Right;   FLEXIBLE SIGMOIDOSCOPY N/A 04/15/2015   Procedure:  UNSEDATED FLEXIBLE SIGMOIDOSCOPY;  Surgeon: Garrett Kallman, MD;  Location: [facility];  Service: Endoscopy;  Laterality: N/A;   HUMERUS IM NAIL Right 12/17/2021   Procedure: INTRAMEDULLARY (IM) NAIL HUMERAL;  Surgeon: Micheline Ahr, MD;  Location:  SURGERY CENTER;  Service: Orthopedics;  Laterality: Right;   KNEE ARTHROSCOPY Left ~ 2016   POSTERIOR CERVICAL FUSION/FORAMINOTOMY N/A 06/04/2022   Procedure: Thoracic one - Thoracic Four Posterior lateral arthrodesis/ fusion and Thoracic two Corpectomy;  Surgeon: Audie Bleacher, MD;  Location: [facility];  Service: Neurosurgery;  Laterality: N/A;   REIMPLANTATION OF TOTAL HIP Right 12/25/2020   Procedure: REIMPLANTATION/REVISION OF RIGHT TOTAL HIP VERSUS REPEAT IRRIGATION  AND DEBRIDEMENT;  Surgeon: Claiborne Crew, MD;  Location: [facility];  Service: Orthopedics;  Laterality: Right;   TESTICLE SURGERY  1988   VARICOCELE   TONSILLECTOMY     VARICOSE VEIN SURGERY Bilateral    Patient Active Problem List   Diagnosis Date Noted   Pancreatic cyst 09/30/2022   Infection due to novel influenza A virus 09/29/2022   Persistent atrial fibrillation (HCC) 07/15/2022   Acute on chronic diastolic heart failure (HCC) 07/03/2022   Pulmonary infiltrates 07/01/2022   Acute respiratory failure with hypoxia (HCC) 07/01/2022   Hyponatremia  06/29/2022   History of thoracic surgery 06/29/2022   Acute DVT of left tibial vein (HCC) 06/21/2022   Hypokalemia 06/21/2022   Vitamin D  deficiency 05/30/2022   Other constipation 05/29/2022   Pathologic compression fracture of spine, initial encounter (HCC) 05/29/2022   Cord compression (HCC) 05/28/2022   Degenerative cervical disc 05/14/2022   Hypercoagulable state due to persistent atrial fibrillation (HCC) 01/11/2022   Multiple myeloma (HCC) 12/10/2021   Medication monitoring encounter 02/27/2021   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   Status post peripherally inserted central catheter (PICC) central line placement 11/06/2020   Drug rash with eosinophilia and systemic symptoms syndrome 11/06/2020   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   PAF (paroxysmal atrial fibrillation) (HCC)    GERD (gastroesophageal reflux disease) 03/29/2018   Hiatal hernia 03/29/2018   History of DVT (deep vein thrombosis) 03/29/2018   CAP (community acquired pneumonia) 03/29/2018   Osteoarthritis 03/29/2018   Neuropathy 08/29/2017   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   Paroxysmal atrial fibrillation with rapid ventricular response (HCC) 04/05/2017   Prolapsed internal hemorrhoids, grade 4, s/p ligation/pexy/hemorrhoidectomy x 2 02/24/2017 02/24/2017   OSA (obstructive sleep apnea) 07/26/2016   Obesity (BMI 30-39.9) 07/26/2016   Atrial fibrillation with RVR (HCC)    Essential hypertension    Chest pain at rest     PCP: Charlene Conners, MD  REFERRING PROVIDER: Gery Kubas, NP  REFERRING DIAG: R53.81 (ICD-10-CM) - Physical deconditioning C90.00 (ICD-10-CM) - Multiple myeloma myelomatosis (HCC)  Rationale for Evaluation and Treatment: Rehabilitation  THERAPY DIAG:  Abnormal posture  Other low back pain  Muscle weakness (generalized)  Cramp and spasm  Difficulty in walking, not elsewhere classified  Pain in left hip  Pain in thoracic  spine  Compression fracture of thoracic vertebra, unspecified thoracic vertebral level, initial encounter (HCC)  Cervicalgia  Multiple myeloma not having achieved remission (HCC)  ONSET DATE: chronic  SUBJECTIVE:  SUBJECTIVE STATEMENT: Patient reports he has a cramp in his foot upon entering gym.  He explains that he had an infusion last week and that his markers are still down for cancer so the Car T treatments seem to be working.  He has most of his pain in his back today.  He rates this pain at 5/10.    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 Yes Pain location: bil knees, Rt hip, upper back, neuropathy in Lt foot > Rt PAIN TYPE: aching, burning, and numbness (foot) Pain description: intermittent and constant  Aggravating factors: exercise, walking - makes me sore Relieving factors: the band exercises help my back   PRECAUTIONS: Other: NO HEAVY RESISTANCE OR WEIGHT LIFTING GIVEN CANCER WITH BONE METS, FALL RISK  RED FLAGS: Cancer with bone mets, fall risk    WEIGHT BEARING RESTRICTIONS: No  FALLS:  Has patient fallen in last 6 months? Yes. Number of falls 0  LIVING ENVIRONMENT: Lives with: lives with their family and lives with their spouse Lives in:  House/apartment Stairs: Yes; Internal: 14 steps; on left going up and External: 3 steps; on right going up Has following equipment at home: Single point cane and Walker - 4 wheeled - rollator   OCCUPATION: semi-retired, attorney   PLOF: Independent with household mobility with device   PATIENT GOALS: LE strength, loosen up back  NEXT MD VISIT: tomorrow  OBJECTIVE:  Note: Objective measures were completed at Evaluation unless otherwise noted.   PATIENT SURVEYS:  Objective measures for fall risk and deconditioning diagnosis - see functional tests  COGNITION: Overall cognitive status: Within functional limits for tasks assessed     SENSATION: Whole foot Rt>Lt numbness related to neuropathy   POSTURE: rounded shoulders, forward head, decreased lumbar lordosis, and increased thoracic kyphosis   TRUNK ROM:    Flexes trunk with fingers to mid-shin with knees bent and holds onto counter Trunk rotation bil 50% Neck rotation bil 50% Neck extension - remains flexed to 5 deg  LOWER EXTREMITY MMT:   Rt hip 4-/5 Lt hip 4/5 Bil knees 4-/5   FUNCTIONAL TESTS:  5 times sit to stand: 19.14 heavy use of UE on chair to rise and control descent Timed up and go (TUG): 18.28 with SPC and use of hands on chair  3 minute walk test: 61' with SCP 10 meter walk test: 12.62 sec with SPC  GAIT: Distance walked: 401 Assistive device utilized: Single point cane Level of assistance: Modified independence Comments: lacks trunk rotation, wide BOS  TREATMENT DATE:  09/05/23: Nustep L 5 x 8 min 3 direction ball roll outs x 10 each direction: seated in chair at barre Thoracic extension (gentle) with purple ball behind back in chair x 10 holding approx 5 sec Standing 3lbs in hand overhead with opposite leg lift 2 x 10 each side (Patient still having cramp in side of foot) tried rocker board to alleviate this spasm x 2 min  Seated lumbar extension with purple (heavy) serious steel band 2 x  10 Lateral band walks at barre with yellow loop x 5 laps Seated clam with yellow loop x 20 Seated mini sit up with 8 lb dumbbell 2 x 10 Seated modified Russian twist x 20 with 8 lbs Seated shoulder to hip x 10 each side with 8 lbs Heel/toe raises with UE support 2 x 10 Standing hip abduction x 20 each LE Standing hip extension x 20 each LE Standing march with min right UE support x20 Sit to stand chair + pad x10 - no UE support   08/31/23: Nustep L 5 x 8 min Heel/toe raises with UE support x10 Standing march with UE support x20 Sidestepping along barre yellow loop at ankles x5 laps back/forth Sit to stand chair + pad x10 - arms lightly on bil arm rests Seated march with 5 lb ankle weights x20  Rocker board x 2 min Standing alternating tap ups on 6" step, then x 20 on 8" step Seated LAQ x 20 each LE Seated HS curl blue band 2x10 Seated clam yellow loop 2x10 Seated bil row with blue band 2 x10 4" step : heel taps 2 x 10 each LE Holding 5 lb dumbbell in each hand with opposite knee lift 2 x 10 each side 2" lateral hip hike with bil UE support x10 each LE  08/29/23: Nustep L 5 x 6 min Heel/toe raises with UE support x10 Standing march with UE support x20 Sidestepping along barre yellow loop at ankles x5 laps back/forth Seated march yellow loop around knees x20 - chair + 2 pads Sit to stand chair + pad x10 - arms lightly on bil arm rests Seated HS curl black loop band 2x10 Seated clam black loop 2x10 Standing cable bil row 15lb x10 Single arm cable row with ipsilateral trunk rotation 10lb x10 each UE Backward resistance walking x 5 reps 15lb with PT providing close supervision for safety LAQ 5lb 2x10 2" fwd step ups with bil UE support 1x10 each LE 2" lateral hip hike with bil UE support x10 each LE  08/24/23 (10 min late) Nustep L 5 x 5 min Seated blue tband row x10, then single arm row with ipsilateral rotation x10 alt directions LAQ with ball squeeze -  5lb 2x10 5 sec  hold Seated hamstring curl with black loop band 2x10 Seated clam black loop 2x10 Sit to stand from chair with UE assist on chair x 10 PPT with alt LE march x20 LTR x20 Bil shoulder extension pulling blue tband down to table - PT anchoring above head                                                                                                                    PATIENT EDUCATION:  Education details: [email or web address] Person educated: Patient Education method: Programmer, multimedia, Facilities manager, Verbal cues, and Handouts Education comprehension: verbalized understanding and returned demonstration  HOME EXERCISE PROGRAM: Access Code: [email or web address] URL: [email or web address] Date: 08/09/2023 Prepared by: Minor Amble  Beuhring  Exercises - Seated Cervical Rotation AROM  - 1 x daily - 7 x weekly - 1 sets - 10 reps - 10 hold - Seated Cervical Sidebending AROM  - 1 x daily - 7 x weekly - 1 sets - 10 reps - 5 hold - Seated Cervical Extension AROM  - 1 x daily - 7 x weekly - 1 sets - 10 reps - 10 hold - Seated Scapular Retraction  - 1 x daily - 7 x weekly - 1-3 sets - 10 reps - 2-3 sec hold - Sidelying Thoracic Rotation with Open Book  - 1 x daily - 7 x weekly - 2 sets - 5 reps - Seated Thoracic Extension Arms Overhead  - 1 x daily - 7 x weekly - 1-2 sets - 10 reps - 2-3 sec hold - Seated Trunk Rotation  - 1 x daily - 7 x weekly - 1-2 sets - 10 reps - 2-3 sec hold - Seated Diagonal Chops with Medicine Ball  - 1 x daily - 7 x weekly - 1-2 sets - 10 reps - 2-3 hold - Seated Long Arc Quad  - 1 x daily - 7 x weekly - 3 sets - 10 reps - Seated March  - 1 x daily - 7 x weekly - 3 sets - 10 reps - Seated Hip External Rotation AROM  - 1 x daily - 7 x weekly - 3 sets - 10 reps - Supine Hamstring Stretch with Strap  - 1 x daily - 7 x weekly - 1 sets - 3 reps - 20 sec hold - Supine ITB Stretch with Strap  - 1 x daily - 7 x weekly - 1 sets - 3 reps - 20 sec hold - Seated Piriformis Stretch  - 1 x daily - 7 x  weekly - 1 sets - 3 reps - 30 sec hold - Seated Hip Adduction Squeeze with Ball  - 1 x daily - 7 x weekly - 3 sets - 10 reps - 2-3 sec hold - Seated 3 Way Exercise [employer] Out Stretch  - 1 x daily - 7 x weekly - 1 sets - 10 reps - Seated Long Arc Quad with Ankle Weight  - 1 x daily - 7 x weekly - 2 sets - 10 reps - Seated Hip Flexion March with Ankle Weights  - 1 x daily - 7 x weekly - 2 sets - 10 reps - Seated Hip External Rotation AROM  - 1 x daily - 7 x weekly - 2 sets - 10 reps - Seated Heel Toe Raises  - 1 x daily - 7 x weekly - 2 sets - 10 reps - Standing Hip Abduction with Counter Support  - 1 x daily - 7 x weekly - 2 sets - 10 reps - Standing Hip Extension with Counter Support  - 1 x daily - 7 x weekly - 2 sets - 10 reps - Heel Raises with Counter Support  - 1 x daily - 7 x weekly - 2 sets - 10 reps - Sit to Stand with Armchair  - 5 x daily - 7 x weekly - 1 sets - 3-5 [id number]  ASSESSMENT:  CLINICAL IMPRESSION: Raenette Bumps was feeling some cramping in the left foot for the first few minutes of PT.  We found that his shoes was folded in.  We fixed this and this alleviated his pain.   He was able to do all assigned tasks with minimal pain and moderate  fatigue.  He is doing some of the standing activities with less UE support.  He did only single UE support on all tasks that he did bilateral support last visit.  He would benefit from continuing skilled PT to progress toward final goals.    OBJECTIVE IMPAIRMENTS: Abnormal gait, cardiopulmonary status limiting activity, decreased activity tolerance, decreased balance, decreased coordination, decreased endurance, decreased mobility, difficulty walking, decreased ROM, decreased strength, hypomobility, increased edema, impaired flexibility, impaired sensation, impaired UE functional use, improper body mechanics, postural dysfunction, and pain.   ACTIVITY LIMITATIONS: carrying, lifting, bending, sitting, standing, squatting, sleeping,  stairs, transfers, bed mobility, bathing, toileting, dressing, hygiene/grooming, and locomotion level  PARTICIPATION LIMITATIONS: meal prep, cleaning, laundry, shopping, and community activity  PERSONAL FACTORS: Age, Time since onset of injury/illness/exacerbation, and 3+ comorbidities: a-fib, history of pathological fractures, fall risk, bone mets, neuropathy of bil LE  are also affecting patient's functional outcome.   REHAB POTENTIAL: Good  CLINICAL DECISION MAKING: Evolving/moderate complexity  EVALUATION COMPLEXITY: Moderate   GOALS: Goals reviewed with patient? Yes  SHORT TERM GOALS: Target date: 09/06/23  Pt will be safe and ind with initial HEP Baseline: Goal status: MET 4/21  2.  Pt will be able to perform 5-10 reps of sit to stand without use of UE on thighs with controlled descent into chair Baseline:  Goal status: MET 08/31/23  3.  Pt will report reduced thoracic spine pain by at least 25% Baseline:  Goal status: MET 08/31/23  4.  Pt will improve TUG test to 16 sec or less using SPC Baseline: 18.28 Goal status: In Progress    LONG TERM GOALS: Target date: 10/04/23  Pt will demo safety and ind with HEP and understand the importance of ongoing compliance upon d/c from PT Baseline:  Goal status: INITIAL  2.  Pt will report reduced thoracic spine pain by at least 50%. Baseline:  Goal status: INITIAL  3.  Pt will cover at least 800' with [facility] in a to improve endurance for community outings. Baseline: 21' in with SPC Goal status: INITIAL  4.  Pt will improve mobility to allow him to safely reach for object on floor using light UE support Baseline:  Goal status: INITIAL  5.  Pt will improve speed with 10 meter walk test to 11.5 sec Baseline: 12.62 Goal status: INITIAL  6.  Pt will improve 5X STS to 16 sec or less without use of UE on chair to demo improved functional strength and reduced fall risk. Baseline:  Goal status: INITIAL  PLAN:  PT  FREQUENCY: 2x/week  PT DURATION: 8 weeks  PLANNED INTERVENTIONS: 97110-Therapeutic exercises, 97530- Therapeutic activity, 97112- Neuromuscular re-education, 97535- Self Care, [id number]- Manual therapy, [phone]- Gait training, Patient/Family education, Balance training, Stair training, Dry Needling, Joint mobilization, Spinal mobilization, Cryotherapy, and Moist heat.  PLAN FOR NEXT SESSION: Avoid supine secondary to back pain. Nustep, review HEP (PT added LE strength to already existing HEP from last episode of care), gait endurance, sit to stand, HS flexibility, functional body weight strength and trunk mobility - no heavy lifting due to bone mets   Corrigan Kretschmer B. Arsema Tusing, PT 09/05/23 11:46 AM [facility] [address], [address] [place], [place] [address] Phone # [phone] Fax [phone]

## 2023-09-07 ENCOUNTER — Encounter: Payer: Self-pay | Admitting: Physical Therapy

## 2023-09-07 ENCOUNTER — Ambulatory Visit: Admitting: Physical Therapy

## 2023-09-07 DIAGNOSIS — M25552 Pain in left hip: Secondary | ICD-10-CM | POA: Diagnosis not present

## 2023-09-07 DIAGNOSIS — R293 Abnormal posture: Secondary | ICD-10-CM | POA: Diagnosis not present

## 2023-09-07 DIAGNOSIS — M542 Cervicalgia: Secondary | ICD-10-CM | POA: Diagnosis not present

## 2023-09-07 DIAGNOSIS — R262 Difficulty in walking, not elsewhere classified: Secondary | ICD-10-CM | POA: Diagnosis not present

## 2023-09-07 DIAGNOSIS — M5459 Other low back pain: Secondary | ICD-10-CM

## 2023-09-07 DIAGNOSIS — M546 Pain in thoracic spine: Secondary | ICD-10-CM | POA: Diagnosis not present

## 2023-09-07 DIAGNOSIS — R252 Cramp and spasm: Secondary | ICD-10-CM

## 2023-09-07 DIAGNOSIS — M6281 Muscle weakness (generalized): Secondary | ICD-10-CM

## 2023-09-07 DIAGNOSIS — C9000 Multiple myeloma not having achieved remission: Secondary | ICD-10-CM | POA: Diagnosis not present

## 2023-09-07 DIAGNOSIS — S22000A Wedge compression fracture of unspecified thoracic vertebra, initial encounter for closed fracture: Secondary | ICD-10-CM | POA: Diagnosis not present

## 2023-09-07 NOTE — Therapy (Signed)
 OUTPATIENT PHYSICAL THERAPY ORTHOPEDIC TREATMENT   Patient Name: Travis [name] MRN: [id number] DOB:1950/07/12, 73 y.o., male Today's Date: 09/07/2023  END OF SESSION:  PT End of Session - 09/07/23 1105     Visit Number 8    Date for PT Re-Evaluation 10/04/23    Authorization Type [employer] MCR    Progress Note Due on Visit 10    PT Start Time 1102    PT Stop Time 1142    PT Time Calculation (min) 40 min    Activity Tolerance Patient tolerated treatment well    Behavior During Therapy WFL for tasks assessed/performed                 Past Medical History:  Diagnosis Date   A-fib (HCC)    Arthritis    "comes and goes; mostly in hands, occasionally elbow" (04/05/2017)   Complication of anesthesia    "just wanted to sleep alot  for couple days S/P VARICOCELE OR" (04/05/2017)   DVT (deep venous thrombosis) (HCC)    LLE   Dysrhythmia    PVCs    GERD (gastroesophageal reflux disease)    History of hiatal hernia    History of radiation therapy    Thoracic Spine- 07/13/22-07/23/22-Dr. Retta Caster   Hypertension    Impaired glucose tolerance    Multiple myeloma (HCC)    Obesity (BMI 30-39.9) 07/26/2016   OSA on CPAP 07/26/2016   Mild with AHI 12.5/hr now on CPAP at 14cm H2O   PAF (paroxysmal atrial fibrillation) (HCC)    s/p PVI Ablation in 2018 // Flecainide  Rx // Echo 8/21: EF 60-65, no RWMA, mild LVH, normal RV SF, moderate RVE, mild BAE, trivial MR, mild dilation of aortic root (40 mm)   Pneumonia    "may have had walking pneumonia a few years ago" (04/05/2017)   Pre-diabetes    Thrombophlebitis    Past Surgical History:  Procedure Laterality Date   ATRIAL FIBRILLATION ABLATION  04/05/2017   ATRIAL FIBRILLATION ABLATION N/A 04/05/2017   Procedure: ATRIAL FIBRILLATION ABLATION;  Surgeon: Jolly Needle, MD;  Location: [facility];  Service: Cardiovascular;  Laterality: N/A;   BONE BIOPSY Right 12/17/2021   Procedure: BONE BIOPSY;  Surgeon: Micheline Ahr, MD;   Location: [facility];  Service: Orthopedics;  Laterality: Right;   COMPLETE RIGHT HIP REPLACMENT  01/19/2019   EVALUATION UNDER ANESTHESIA WITH HEMORRHOIDECTOMY N/A 02/24/2017   Procedure: EXAM UNDER ANESTHESIA WITH  HEMORRHOIDECTOMY;  Surgeon: Candyce Champagne, MD;  Location: [facility];  Service: General;  Laterality: N/A;   EXCISIONAL TOTAL HIP ARTHROPLASTY WITH ANTIBIOTIC SPACERS Right 09/25/2020   Procedure: EXCISIONAL TOTAL HIP ARTHROPLASTY WITH ANTIBIOTIC SPACERS;  Surgeon: Claiborne Crew, MD;  Location: [facility];  Service: Orthopedics;  Laterality: Right;   FLEXIBLE SIGMOIDOSCOPY N/A 04/15/2015   Procedure:  UNSEDATED FLEXIBLE SIGMOIDOSCOPY;  Surgeon: Garrett Kallman, MD;  Location: [facility];  Service: Endoscopy;  Laterality: N/A;   HUMERUS IM NAIL Right 12/17/2021   Procedure: INTRAMEDULLARY (IM) NAIL HUMERAL;  Surgeon: Micheline Ahr, MD;  Location: [facility];  Service: Orthopedics;  Laterality: Right;   KNEE ARTHROSCOPY Left ~ 2016   POSTERIOR CERVICAL FUSION/FORAMINOTOMY N/A 06/04/2022   Procedure: Thoracic one - Thoracic Four Posterior lateral arthrodesis/ fusion and Thoracic two Corpectomy;  Surgeon: Audie Bleacher, MD;  Location: [facility];  Service: Neurosurgery;  Laterality: N/A;   REIMPLANTATION OF TOTAL HIP Right 12/25/2020   Procedure: REIMPLANTATION/REVISION OF RIGHT TOTAL HIP VERSUS REPEAT  IRRIGATION AND DEBRIDEMENT;  Surgeon: Claiborne Crew, MD;  Location: [facility];  Service: Orthopedics;  Laterality: Right;   TESTICLE SURGERY  1988   VARICOCELE   TONSILLECTOMY     VARICOSE VEIN SURGERY Bilateral    Patient Active Problem List   Diagnosis Date Noted   Pancreatic cyst 09/30/2022   Infection due to novel influenza A virus 09/29/2022   Persistent atrial fibrillation (HCC) 07/15/2022   Acute on chronic diastolic heart failure (HCC) 07/03/2022   Pulmonary infiltrates 07/01/2022   Acute respiratory failure with hypoxia (HCC) 07/01/2022   Hyponatremia  06/29/2022   History of thoracic surgery 06/29/2022   Acute DVT of left tibial vein (HCC) 06/21/2022   Hypokalemia 06/21/2022   Vitamin D  deficiency 05/30/2022   Other constipation 05/29/2022   Pathologic compression fracture of spine, initial encounter (HCC) 05/29/2022   Cord compression (HCC) 05/28/2022   Degenerative cervical disc 05/14/2022   Hypercoagulable state due to persistent atrial fibrillation (HCC) 01/11/2022   Multiple myeloma (HCC) 12/10/2021   Medication monitoring encounter 02/27/2021   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   Status post peripherally inserted central catheter (PICC) central line placement 11/06/2020   Drug rash with eosinophilia and systemic symptoms syndrome 11/06/2020   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   PAF (paroxysmal atrial fibrillation) (HCC)    GERD (gastroesophageal reflux disease) 03/29/2018   Hiatal hernia 03/29/2018   History of DVT (deep vein thrombosis) 03/29/2018   CAP (community acquired pneumonia) 03/29/2018   Osteoarthritis 03/29/2018   Neuropathy 08/29/2017   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   Paroxysmal atrial fibrillation with rapid ventricular response (HCC) 04/05/2017   Prolapsed internal hemorrhoids, grade 4, s/p ligation/pexy/hemorrhoidectomy x 2 02/24/2017 02/24/2017   OSA (obstructive sleep apnea) 07/26/2016   Obesity (BMI 30-39.9) 07/26/2016   Atrial fibrillation with RVR (HCC)    Essential hypertension    Chest pain at rest     PCP: Charlene Conners, MD  REFERRING PROVIDER: Gery Kubas, NP  REFERRING DIAG: R53.81 (ICD-10-CM) - Physical deconditioning C90.00 (ICD-10-CM) - Multiple myeloma myelomatosis (HCC)  Rationale for Evaluation and Treatment: Rehabilitation  THERAPY DIAG:  Abnormal posture  Other low back pain  Muscle weakness (generalized)  Cramp and spasm  ONSET DATE: chronic  SUBJECTIVE:                                                                                                                                                                                            SUBJECTIVE STATEMENT: Pt reports he has had two good days.  Doing more walking without cane around the house.  Some upper back  pain but less in legs as they feel stronger.  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 Yes Pain location: upper back 3/10 PAIN TYPE: aching, burning, and numbness (foot) Pain description: intermittent and constant  Aggravating factors: exercise, walking - makes me sore Relieving factors: the band exercises help my back   PRECAUTIONS: Other: NO HEAVY RESISTANCE OR WEIGHT LIFTING GIVEN CANCER WITH BONE METS, FALL RISK  RED FLAGS: Cancer with bone mets, fall risk    WEIGHT BEARING RESTRICTIONS: No  FALLS:  Has patient fallen in last 6 months? Yes. Number of falls 0  LIVING ENVIRONMENT: Lives with: lives with their family and lives with their spouse Lives in: House/apartment Stairs: Yes; Internal: 14 steps; on left going up and External: 3 steps; on right going up Has following equipment at home: Single point cane and Walker - 4 wheeled - rollator   OCCUPATION: semi-retired, attorney   PLOF: Independent with household mobility with device   PATIENT GOALS: LE strength, loosen up back  NEXT MD VISIT: tomorrow  OBJECTIVE:  Note: Objective measures were completed at Evaluation unless  otherwise noted.   PATIENT SURVEYS:  Objective measures for fall risk and deconditioning diagnosis - see functional tests  COGNITION: Overall cognitive status: Within functional limits for tasks assessed     SENSATION: Whole foot Rt>Lt numbness related to neuropathy   POSTURE: rounded shoulders, forward head, decreased lumbar lordosis, and increased thoracic kyphosis   TRUNK ROM:    Flexes trunk with fingers to mid-shin with knees bent and holds onto counter Trunk rotation bil 50% Neck rotation bil 50% Neck extension - remains flexed to 5 deg  LOWER EXTREMITY MMT:   Rt hip 4-/5 Lt hip 4/5 Bil knees 4-/5   FUNCTIONAL TESTS:  09/07/23: 5X STS: 13.32 sec chair + pad with light UE on thighs TUG: 13.14 sec no cane Walk test: Attempted with intermittent use of SPC - made it 4:15 covering 636'  Eval: 5 times sit to stand: 19.14 heavy use of UE on chair to rise and control descent Timed up and go (TUG): 18.28 with SPC and use of hands on chair  3 minute walk test: 28' with SCP 10 meter walk test: 12.62 sec with SPC  GAIT: Distance walked: 401 Assistive device utilized: Single point cane Level of assistance: Modified independence Comments: lacks trunk rotation, wide BOS  TREATMENT DATE:  09/07/23: Attempted with intermittent use of SPC - made it 4:15 covering 636' - Rt hip pain limited NuStep L5 x5' Standing cable bil row 25lb x10 Single arm cable row with ipsilateral trunk rotation 15lb x10 each UE TUG and 5xSTS Backward resistance walking x 5 reps 15lb with PT providing close supervision for safety LAQ 5lb 2x10 4" fwd step ups with bil UE support 1x10 each LE Seated modified Russian twist x 20 with 8 lbs Seated shoulder to hip x 10 each side with 8 lbs 4" lateral hip hike with  bil UE support 2x10 each LE Fwd march over low hurdles along barre x3 laps Standing march double tap cone with bil UE support Lateral step over low hurdle and back 2x5 bil UE  support  09/05/23: Nustep L 5 x 8 min 3 direction ball roll outs x 10 each direction: seated in chair at barre Thoracic extension (gentle) with purple ball behind back in chair x 10 holding approx 5 sec Standing 3lbs in hand overhead with opposite leg lift 2 x 10 each side (Patient still having cramp in side of foot) tried rocker board to alleviate this spasm x 2 min Seated lumbar extension with purple (heavy) serious steel band 2 x 10 Lateral band walks at barre with yellow loop x 5 laps Seated clam with yellow loop x 20 Seated mini sit up with 8 lb dumbbell 2 x 10 Seated modified Russian twist x 20 with 8 lbs Seated shoulder to hip x 10 each side with 8 lbs Heel/toe raises with UE support 2 x 10 Standing hip abduction x 20 each LE Standing hip extension x 20 each LE Standing march with min right UE support x20 Sit to stand chair + pad x10 - no UE support   08/31/23: Nustep L 5 x 8 min Heel/toe raises with UE support x10 Standing march with UE support x20 Sidestepping along barre yellow loop at ankles x5 laps back/forth Sit to stand chair + pad x10 - arms lightly on bil arm rests Seated march with 5 lb ankle weights x20  Rocker board x 2 min Standing alternating tap ups on 6" step, then x 20 on 8" step Seated LAQ x 20 each LE Seated HS curl blue band 2x10 Seated clam yellow loop 2x10 Seated bil row with blue band 2 x10 4" step : heel taps 2 x 10 each LE Holding 5 lb dumbbell in each hand with opposite knee lift 2 x 10 each side 2" lateral hip hike with bil UE support x10 each LE                                                                                                            PATIENT EDUCATION:  Education details: [email or web address] Person educated: Patient Education method: Programmer, multimedia, Facilities manager, Verbal cues, and Handouts Education comprehension: verbalized understanding and returned demonstration  HOME EXERCISE PROGRAM: Access Code: [email or web address] URL:  [email or web address] Date: 08/09/2023 Prepared by: Minor Amble Ayde Record  Exercises - Seated Cervical Rotation AROM  - 1 x daily - 7 x weekly - 1 sets - 10 reps - 10 hold - Seated Cervical Sidebending AROM  - 1 x daily - 7 x weekly - 1 sets - 10 reps - 5 hold - Seated Cervical Extension AROM  - 1 x daily - 7 x weekly - 1 sets - 10 reps - 10 hold - Seated Scapular Retraction  - 1 x daily - 7 x weekly - 1-3 sets - 10 reps - 2-3 sec hold - Sidelying Thoracic Rotation with Open Book  - 1 x daily - 7 x weekly -  2 sets - 5 reps - Seated Thoracic Extension Arms Overhead  - 1 x daily - 7 x weekly - 1-2 sets - 10 reps - 2-3 sec hold - Seated Trunk Rotation  - 1 x daily - 7 x weekly - 1-2 sets - 10 reps - 2-3 sec hold - Seated Diagonal Chops with Medicine Ball  - 1 x daily - 7 x weekly - 1-2 sets - 10 reps - 2-3 hold - Seated Long Arc Quad  - 1 x daily - 7 x weekly - 3 sets - 10 reps - Seated March  - 1 x daily - 7 x weekly - 3 sets - 10 reps - Seated Hip External Rotation AROM  - 1 x daily - 7 x weekly - 3 sets - 10 reps - Supine Hamstring Stretch with Strap  - 1 x daily - 7 x weekly - 1 sets - 3 reps - 20 sec hold - Supine ITB Stretch with Strap  - 1 x daily - 7 x weekly - 1 sets - 3 reps - 20 sec hold - Seated Piriformis Stretch  - 1 x daily - 7 x weekly - 1 sets - 3 reps - 30 sec hold - Seated Hip Adduction Squeeze with Ball  - 1 x daily - 7 x weekly - 3 sets - 10 reps - 2-3 sec hold - Seated 3 Way Exercise [employer] Out Stretch  - 1 x daily - 7 x weekly - 1 sets - 10 reps - Seated Long Arc Quad with Ankle Weight  - 1 x daily - 7 x weekly - 2 sets - 10 reps - Seated Hip Flexion March with Ankle Weights  - 1 x daily - 7 x weekly - 2 sets - 10 reps - Seated Hip External Rotation AROM  - 1 x daily - 7 x weekly - 2 sets - 10 reps - Seated Heel Toe Raises  - 1 x daily - 7 x weekly - 2 sets - 10 reps - Standing Hip Abduction with Counter Support  - 1 x daily - 7 x weekly - 2 sets - 10 reps -  Standing Hip Extension with Counter Support  - 1 x daily - 7 x weekly - 2 sets - 10 reps - Heel Raises with Counter Support  - 1 x daily - 7 x weekly - 2 sets - 10 reps - Sit to Stand with Armchair  - 5 x daily - 7 x weekly - 1 sets - 3-5 [id number]  ASSESSMENT:  CLINICAL IMPRESSION: Bill reports improved pain, endurance and strength in bil LE.  He has now met all STG and is working towards LTG. He attempted today and was able to walk x 4:15 covering 636 feet with very little use of cane, limited by Lt foot and Rt hip pain.  He met 5x STS and TUG test goals today.  He demos much improved exercise tolerance in standing as well and improved functional strength using UE much less with sit to stand transfers.  He was able to increase step ups to 4" today and add hurdle work with UE support.  He will continue to benefit from skilled PT along POC.   OBJECTIVE IMPAIRMENTS: Abnormal gait, cardiopulmonary status limiting activity, decreased activity tolerance, decreased balance, decreased coordination, decreased endurance, decreased mobility, difficulty walking, decreased ROM, decreased strength, hypomobility, increased edema, impaired flexibility, impaired sensation, impaired UE functional use, improper body mechanics, postural dysfunction, and pain.   ACTIVITY  LIMITATIONS: carrying, lifting, bending, sitting, standing, squatting, sleeping, stairs, transfers, bed mobility, bathing, toileting, dressing, hygiene/grooming, and locomotion level  PARTICIPATION LIMITATIONS: meal prep, cleaning, laundry, shopping, and community activity  PERSONAL FACTORS: Age, Time since onset of injury/illness/exacerbation, and 3+ comorbidities: a-fib, history of pathological fractures, fall risk, bone mets, neuropathy of bil LE  are also affecting patient's functional outcome.   REHAB POTENTIAL: Good  CLINICAL DECISION MAKING: Evolving/moderate complexity  EVALUATION COMPLEXITY: Moderate   GOALS: Goals  reviewed with patient? Yes  SHORT TERM GOALS: Target date: 09/06/23  Pt will be safe and ind with initial HEP Baseline: Goal status: MET 4/21  2.  Pt will be able to perform 5-10 reps of sit to stand without use of UE on thighs with controlled descent into chair Baseline:  Goal status: MET 08/31/23  3.  Pt will report reduced thoracic spine pain by at least 25% Baseline:  Goal status: MET 08/31/23  4.  Pt will improve TUG test to 16 sec or less using SPC Baseline: 18.28 Goal status: MET 5/7    LONG TERM GOALS: Target date: 10/04/23  Pt will demo safety and ind with HEP and understand the importance of ongoing compliance upon d/c from PT Baseline:  Goal status: INITIAL  2.  Pt will report reduced thoracic spine pain by at least 50%. Baseline:  Goal status: INITIAL  3.  Pt will cover at least 800' with [facility] in a to improve endurance for community outings. Baseline: 72' in with SPC Goal status: ONGOING 5/7 Attempted with intermittent use of SPC - made it 4:15 covering 636'  4.  Pt will improve mobility to allow him to safely reach for object on floor using light UE support Baseline:  Goal status: INITIAL  5.  Pt will improve speed with 10 meter walk test to 11.5 sec Baseline: 12.62 Goal status: INITIAL  6.  Pt will improve 5X STS to 16 sec or less without use of UE on chair to demo improved functional strength and reduced fall risk. Baseline:  Goal status: MET 5/7  PLAN:  PT FREQUENCY: 2x/week  PT DURATION: 8 weeks  PLANNED INTERVENTIONS: 97110-Therapeutic exercises, 97530- Therapeutic activity, 97112- Neuromuscular re-education, 97535- Self Care, 16109- Manual therapy, [phone]- Gait training, Patient/Family education, Balance training, Stair training, Dry Needling, Joint mobilization, Spinal mobilization, Cryotherapy, and Moist heat.  PLAN FOR NEXT SESSION: Avoid supine secondary to back pain. Nustep, review HEP (PT added LE strength to already existing  HEP from last episode of care), gait endurance, sit to stand, HS flexibility, functional body weight strength and trunk mobility - no heavy lifting due to bone mets  Raynell Caller, PT 09/07/23 11:43 AM  [facility] [address], [address] [place], [place] [address] Phone # [phone] Fax [phone]

## 2023-09-10 ENCOUNTER — Encounter: Payer: Self-pay | Admitting: Internal Medicine

## 2023-09-11 NOTE — Therapy (Signed)
 OUTPATIENT PHYSICAL THERAPY ORTHOPEDIC TREATMENT   Patient Name: Travis [name] MRN: [id number] DOB:09-21-1950, 73 y.o., male Today's Date: 09/12/2023  END OF SESSION:  PT End of Session - 09/12/23 1105     Visit Number 9    Date for PT Re-Evaluation 10/04/23    Authorization Type [employer] MCR    Progress Note Due on Visit 10    PT Start Time 1105    PT Stop Time 1144    PT Time Calculation (min) 39 min    Activity Tolerance Patient tolerated treatment well    Behavior During Therapy WFL for tasks assessed/performed                  Past Medical History:  Diagnosis Date   A-fib (HCC)    Arthritis    "comes and goes; mostly in hands, occasionally elbow" (04/05/2017)   Complication of anesthesia    "just wanted to sleep alot  for couple days S/P VARICOCELE OR" (04/05/2017)   DVT (deep venous thrombosis) (HCC)    LLE   Dysrhythmia    PVCs    GERD (gastroesophageal reflux disease)    History of hiatal hernia    History of radiation therapy    Thoracic Spine- 07/13/22-07/23/22-Dr. Retta Caster   Hypertension    Impaired glucose tolerance    Multiple myeloma (HCC)    Obesity (BMI 30-39.9) 07/26/2016   OSA on CPAP 07/26/2016   Mild with AHI 12.5/hr now on CPAP at 14cm H2O   PAF (paroxysmal atrial fibrillation) (HCC)    s/p PVI Ablation in 2018 // Flecainide  Rx // Echo 8/21: EF 60-65, no RWMA, mild LVH, normal RV SF, moderate RVE, mild BAE, trivial MR, mild dilation of aortic root (40 mm)   Pneumonia    "may have had walking pneumonia a few years ago" (04/05/2017)   Pre-diabetes    Thrombophlebitis    Past Surgical History:  Procedure Laterality Date   ATRIAL FIBRILLATION ABLATION  04/05/2017   ATRIAL FIBRILLATION ABLATION N/A 04/05/2017   Procedure: ATRIAL FIBRILLATION ABLATION;  Surgeon: Jolly Needle, MD;  Location: [facility];  Service: Cardiovascular;  Laterality: N/A;   BONE BIOPSY Right 12/17/2021   Procedure: BONE BIOPSY;  Surgeon: Micheline Ahr, MD;   Location: [facility];  Service: Orthopedics;  Laterality: Right;   COMPLETE RIGHT HIP REPLACMENT  01/19/2019   EVALUATION UNDER ANESTHESIA WITH HEMORRHOIDECTOMY N/A 02/24/2017   Procedure: EXAM UNDER ANESTHESIA WITH  HEMORRHOIDECTOMY;  Surgeon: Candyce Champagne, MD;  Location: [facility];  Service: General;  Laterality: N/A;   EXCISIONAL TOTAL HIP ARTHROPLASTY WITH ANTIBIOTIC SPACERS Right 09/25/2020   Procedure: EXCISIONAL TOTAL HIP ARTHROPLASTY WITH ANTIBIOTIC SPACERS;  Surgeon: Claiborne Crew, MD;  Location: [facility];  Service: Orthopedics;  Laterality: Right;   FLEXIBLE SIGMOIDOSCOPY N/A 04/15/2015   Procedure:  UNSEDATED FLEXIBLE SIGMOIDOSCOPY;  Surgeon: Garrett Kallman, MD;  Location: [facility];  Service: Endoscopy;  Laterality: N/A;   HUMERUS IM NAIL Right 12/17/2021   Procedure: INTRAMEDULLARY (IM) NAIL HUMERAL;  Surgeon: Micheline Ahr, MD;  Location: [facility];  Service: Orthopedics;  Laterality: Right;   KNEE ARTHROSCOPY Left ~ 2016   POSTERIOR CERVICAL FUSION/FORAMINOTOMY N/A 06/04/2022   Procedure: Thoracic one - Thoracic Four Posterior lateral arthrodesis/ fusion and Thoracic two Corpectomy;  Surgeon: Audie Bleacher, MD;  Location: [facility];  Service: Neurosurgery;  Laterality: N/A;   REIMPLANTATION OF TOTAL HIP Right 12/25/2020   Procedure: REIMPLANTATION/REVISION OF RIGHT TOTAL HIP VERSUS  REPEAT IRRIGATION AND DEBRIDEMENT;  Surgeon: Claiborne Crew, MD;  Location: [facility];  Service: Orthopedics;  Laterality: Right;   TESTICLE SURGERY  1988   VARICOCELE   TONSILLECTOMY     VARICOSE VEIN SURGERY Bilateral    Patient Active Problem List   Diagnosis Date Noted   Pancreatic cyst 09/30/2022   Infection due to novel influenza A virus 09/29/2022   Persistent atrial fibrillation (HCC) 07/15/2022   Acute on chronic diastolic heart failure (HCC) 07/03/2022   Pulmonary infiltrates 07/01/2022   Acute respiratory failure with hypoxia (HCC) 07/01/2022   Hyponatremia  06/29/2022   History of thoracic surgery 06/29/2022   Acute DVT of left tibial vein (HCC) 06/21/2022   Hypokalemia 06/21/2022   Vitamin D  deficiency 05/30/2022   Other constipation 05/29/2022   Pathologic compression fracture of spine, initial encounter (HCC) 05/29/2022   Cord compression (HCC) 05/28/2022   Degenerative cervical disc 05/14/2022   Hypercoagulable state due to persistent atrial fibrillation (HCC) 01/11/2022   Multiple myeloma (HCC) 12/10/2021   Medication monitoring encounter 02/27/2021   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   Status post peripherally inserted central catheter (PICC) central line placement 11/06/2020   Drug rash with eosinophilia and systemic symptoms syndrome 11/06/2020   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   PAF (paroxysmal atrial fibrillation) (HCC)    GERD (gastroesophageal reflux disease) 03/29/2018   Hiatal hernia 03/29/2018   History of DVT (deep vein thrombosis) 03/29/2018   CAP (community acquired pneumonia) 03/29/2018   Osteoarthritis 03/29/2018   Neuropathy 08/29/2017   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   Paroxysmal atrial fibrillation with rapid ventricular response (HCC) 04/05/2017   Prolapsed internal hemorrhoids, grade 4, s/p ligation/pexy/hemorrhoidectomy x 2 02/24/2017 02/24/2017   OSA (obstructive sleep apnea) 07/26/2016   Obesity (BMI 30-39.9) 07/26/2016   Atrial fibrillation with RVR (HCC)    Essential hypertension    Chest pain at rest     PCP: Charlene Conners, MD  REFERRING PROVIDER: Gery Kubas, NP  REFERRING DIAG: R53.81 (ICD-10-CM) - Physical deconditioning C90.00 (ICD-10-CM) - Multiple myeloma myelomatosis (HCC)  Rationale for Evaluation and Treatment: Rehabilitation  THERAPY DIAG:  Abnormal posture  Other low back pain  Muscle weakness (generalized)  Cramp and spasm  Difficulty in walking, not elsewhere classified  Pain in left hip  Pain in thoracic  spine  ONSET DATE: chronic  SUBJECTIVE:                                                                                                                                                                                           SUBJECTIVE STATEMENT: Wed, Friday and  Saturday night I have been in a lot of pain. Wed and Friday went out to dinner and sat in a chair for 2 hours both nights. 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 Yes Pain location: upper back 3/10 PAIN TYPE: aching, burning, and numbness (foot) Pain description: intermittent and constant  Aggravating factors: exercise, walking - makes me sore Relieving factors: the band exercises help my back   PRECAUTIONS: Other: NO HEAVY RESISTANCE OR WEIGHT LIFTING GIVEN CANCER WITH BONE METS, FALL RISK  RED FLAGS: Cancer with bone mets, fall risk   WEIGHT BEARING RESTRICTIONS: No  FALLS:  Has patient fallen in last 6 months? Yes. Number of falls 0  LIVING ENVIRONMENT: Lives with: lives with their family and lives with their spouse Lives in: House/apartment Stairs: Yes; Internal: 14 steps; on left going up and External: 3 steps; on right going up Has following equipment at home: Single point cane and Walker - 4 wheeled - rollator   OCCUPATION: semi-retired, attorney   PLOF: Independent with household mobility with device   PATIENT GOALS: LE strength, loosen up  back  NEXT MD VISIT: tomorrow  OBJECTIVE:  Note: Objective measures were completed at Evaluation unless otherwise noted.   PATIENT SURVEYS:  Objective measures for fall risk and deconditioning diagnosis - see functional tests  COGNITION: Overall cognitive status: Within functional limits for tasks assessed     SENSATION: Whole foot Rt>Lt numbness related to neuropathy   POSTURE: rounded shoulders, forward head, decreased lumbar lordosis, and increased thoracic kyphosis   TRUNK ROM:    Flexes trunk with fingers to mid-shin with knees bent and holds onto counter Trunk rotation bil 50% Neck rotation bil 50% Neck extension - remains flexed to 5 deg  LOWER EXTREMITY MMT:   Rt hip 4-/5 Lt hip 4/5 Bil knees 4-/5   FUNCTIONAL TESTS:  09/07/23: 5X STS: 13.32 sec chair + pad with light UE on thighs TUG: 13.14 sec no cane Walk test: Attempted with intermittent use of SPC - made it 4:15 covering 636'  Eval: 5 times sit to stand: 19.14 heavy use of UE on chair to rise and control descent Timed up and go (TUG): 18.28 with SPC and use of hands on chair  3 minute walk test: 33' with SCP 10 meter walk test: 12.62 sec with SPC  GAIT: Distance walked: 401 Assistive device utilized: Single point cane Level of assistance: Modified independence Comments: lacks trunk rotation, wide BOS  TREATMENT DATE:  09/12/23: NuStep L5 x5' Standing cable bil row 25lb x20 Single arm cable row with ipsilateral trunk rotation 10lb x10 each UE Backward resistance walking x 5 reps 15lb with PT providing close supervision for safety LAQ 5lb 2x10 4" fwd step ups with bil UE support 1x10 each LE Seated modified Russian twist x 20 with 8 lbs Seated shoulder to hip x 10 each side with 8 lbs Sit to stand chair +  pad 2x5 - no UE support  6" (on stairs) lateral hip hike with bil UE support 2x10 each LE Lateral step over low hurdle and back 2x5 bil UE support Fwd march over low hurdles along barre  x3 laps Standing march double tap cone with bil UE support 3 direction ball roll outs x 10 each direction: seated in chair at barre Seated HS curls blue 2x10   09/07/23: Attempted with intermittent use of SPC - made it 4:15 covering 636' - Rt hip pain limited NuStep L5 x5' Standing cable bil row 25lb x10 Single arm cable row with ipsilateral trunk rotation 15lb x10 each UE TUG and 5xSTS Backward resistance walking x 5 reps 15lb with PT providing close supervision for safety LAQ 5lb 2x10 4" fwd step ups with bil UE support 1x10 each LE Seated modified Russian twist x 20 with 8 lbs Seated shoulder to hip x 10 each side with 8 lbs 4" lateral hip hike with bil UE support 2x10 each LE Fwd march over low hurdles along barre x3 laps Standing march double tap cone with bil UE support Lateral step over low hurdle and back 2x5 bil UE support   09/05/23: Nustep L 5 x 8 min 3 direction ball roll outs x 10 each direction: seated in chair at barre Thoracic extension (gentle) with purple ball behind back in chair x 10 holding approx 5 sec Standing 3lbs in hand overhead with opposite leg lift 2 x 10 each side (Patient still having cramp in side of foot) tried rocker board to alleviate this spasm x 2 min Seated lumbar extension with purple (heavy) serious steel band 2 x 10 Lateral band walks at barre with yellow loop x 5 laps Seated clam with yellow loop x 20 Seated mini sit up with 8 lb dumbbell 2 x 10 Seated modified Russian twist x 20 with 8 lbs Seated shoulder to hip x 10 each side with 8 lbs Heel/toe raises with UE support 2 x 10 Standing hip abduction x 20 each LE Standing hip extension x 20 each LE Standing march with min right UE support x20 Sit to stand chair + pad x10 - no UE support   08/31/23: Nustep L 5 x 8 min Heel/toe raises with UE support x10 Standing march with UE support x20 Sidestepping along barre yellow loop at ankles x5 laps back/forth Sit to stand chair + pad x10  - arms lightly on bil arm rests Seated march with 5 lb ankle weights x20  Rocker board x 2 min Standing alternating tap ups on 6" step, then x 20 on 8" step Seated LAQ x 20 each LE Seated HS curl blue band 2x10 Seated clam yellow loop 2x10 Seated bil row with blue band 2 x10 4" step : heel taps 2 x 10 each LE Holding 5 lb dumbbell in each hand with opposite knee lift 2 x 10 each side 2" lateral hip hike with bil UE support x10 each LE  PATIENT EDUCATION:  Education details: [email or web address] Person educated: Patient Education method: Programmer, multimedia, Facilities manager, Verbal cues, and Handouts Education comprehension: verbalized understanding and returned demonstration  HOME EXERCISE PROGRAM: Access Code: [email or web address] URL: [email or web address] Date: 08/09/2023 Prepared by: Minor Amble Beuhring  Exercises - Seated Cervical Rotation AROM  - 1 x daily - 7 x weekly - 1 sets - 10 reps - 10 hold - Seated Cervical Sidebending AROM  - 1 x daily - 7 x weekly - 1 sets - 10 reps - 5 hold - Seated Cervical Extension AROM  - 1 x daily - 7 x weekly - 1 sets - 10 reps - 10 hold - Seated Scapular Retraction  - 1 x daily - 7 x weekly - 1-3 sets - 10 reps - 2-3 sec hold - Sidelying Thoracic Rotation with Open Book  - 1 x daily - 7 x weekly - 2 sets - 5 reps - Seated Thoracic Extension Arms Overhead  - 1 x daily - 7 x weekly - 1-2 sets - 10 reps - 2-3 sec hold - Seated Trunk Rotation  - 1 x daily - 7 x weekly - 1-2 sets - 10 reps - 2-3 sec hold - Seated Diagonal Chops with Medicine Ball  - 1 x daily - 7 x weekly - 1-2 sets - 10 reps - 2-3 hold - Seated Long Arc Quad  - 1 x daily - 7 x weekly - 3 sets - 10 reps - Seated March  - 1 x daily - 7 x weekly - 3 sets - 10 reps - Seated Hip External Rotation AROM  - 1 x daily - 7 x weekly - 3 sets - 10 reps - Supine Hamstring Stretch with Strap  - 1 x daily - 7  x weekly - 1 sets - 3 reps - 20 sec hold - Supine ITB Stretch with Strap  - 1 x daily - 7 x weekly - 1 sets - 3 reps - 20 sec hold - Seated Piriformis Stretch  - 1 x daily - 7 x weekly - 1 sets - 3 reps - 30 sec hold - Seated Hip Adduction Squeeze with Ball  - 1 x daily - 7 x weekly - 3 sets - 10 reps - 2-3 sec hold - Seated 3 Way Exercise [employer] Out Stretch  - 1 x daily - 7 x weekly - 1 sets - 10 reps - Seated Long Arc Quad with Ankle Weight  - 1 x daily - 7 x weekly - 2 sets - 10 reps - Seated Hip Flexion March with Ankle Weights  - 1 x daily - 7 x weekly - 2 sets - 10 reps - Seated Hip External Rotation AROM  - 1 x daily - 7 x weekly - 2 sets - 10 reps - Seated Heel Toe Raises  - 1 x daily - 7 x weekly - 2 sets - 10 reps - Standing Hip Abduction with Counter Support  - 1 x daily - 7 x weekly - 2 sets - 10 reps - Standing Hip Extension with Counter Support  - 1 x daily - 7 x weekly - 2 sets - 10 reps - Heel Raises with Counter Support  - 1 x daily - 7 x weekly - 2 sets - 10 reps - Sit to Stand with Armchair  - 5 x daily - 7 x weekly - 1 sets - 3-5 [id number]  ASSESSMENT:  CLINICAL IMPRESSION: Raenette Bumps reports increased pain today since last Wed. He took his  last dose of prednisone  last Tuesday. He tolerated all exercises today without reports of increased pain. He did note he could feel his back with modified [place] twists.   OBJECTIVE IMPAIRMENTS: Abnormal gait, cardiopulmonary status limiting activity, decreased activity tolerance, decreased balance, decreased coordination, decreased endurance, decreased mobility, difficulty walking, decreased ROM, decreased strength, hypomobility, increased edema, impaired flexibility, impaired sensation, impaired UE functional use, improper body mechanics, postural dysfunction, and pain.   ACTIVITY LIMITATIONS: carrying, lifting, bending, sitting, standing, squatting, sleeping, stairs, transfers, bed mobility, bathing, toileting, dressing,  hygiene/grooming, and locomotion level  PARTICIPATION LIMITATIONS: meal prep, cleaning, laundry, shopping, and community activity  PERSONAL FACTORS: Age, Time since onset of injury/illness/exacerbation, and 3+ comorbidities: a-fib, history of pathological fractures, fall risk, bone mets, neuropathy of bil LE are also affecting patient's functional outcome.   REHAB POTENTIAL: Good  CLINICAL DECISION MAKING: Evolving/moderate complexity  EVALUATION COMPLEXITY: Moderate   GOALS: Goals reviewed with patient? Yes  SHORT TERM GOALS: Target date: 09/06/23  Pt will be safe and ind with initial HEP Baseline: Goal status: MET 4/21  2.  Pt will be able to perform 5-10 reps of sit to stand without use of UE on thighs with controlled descent into chair Baseline:  Goal status: MET 08/31/23  3.  Pt will report reduced thoracic spine pain by at least 25% Baseline:  Goal status: MET 08/31/23  4.  Pt will improve TUG test to 16 sec or less using SPC Baseline: 18.28 Goal status: MET 5/7    LONG TERM GOALS: Target date: 10/04/23  Pt will demo safety and ind with HEP and understand the importance of ongoing compliance upon d/c from PT Baseline:  Goal status: INITIAL  2.  Pt will report reduced thoracic spine pain by at least 50%. Baseline:  Goal status: INITIAL  3.  Pt will cover at least 800' with [facility] in a to improve endurance for community outings. Baseline: 53' in with SPC Goal status: ONGOING 5/7 Attempted with intermittent use of SPC - made it 4:15 covering 636'  4.  Pt will improve mobility to allow him to safely reach for object on floor using light UE support Baseline:  Goal status: INITIAL  5.  Pt will improve speed with 10 meter walk test to 11.5 sec Baseline: 12.62 Goal status: INITIAL  6.  Pt will improve 5X STS to 16 sec or less without use of UE on chair to demo improved functional strength and reduced fall risk. Baseline:  Goal status: MET  5/7  PLAN:  PT FREQUENCY: 2x/week  PT DURATION: 8 weeks  PLANNED INTERVENTIONS: 97110-Therapeutic exercises, 97530- Therapeutic activity, 97112- Neuromuscular re-education, 97535- Self Care, [id number]- Manual therapy, [phone]- Gait training, Patient/Family education, Balance training, Stair training, Dry Needling, Joint mobilization, Spinal mobilization, Cryotherapy, and Moist heat.  PLAN FOR NEXT SESSION: 10th visit note. Avoid supine secondary to back pain. Nustep, review HEP (PT added LE strength to already existing HEP from last episode of care), gait endurance, sit to stand, HS flexibility, functional body weight strength and trunk mobility - no heavy lifting due to bone mets  Jinx Mourning, PT  09/12/23 11:48 AM  [facility] [address] [place], [place] [address] Phone # [phone] Fax [phone]

## 2023-09-12 ENCOUNTER — Ambulatory Visit: Admitting: Physical Therapy

## 2023-09-12 ENCOUNTER — Encounter: Payer: Self-pay | Admitting: Physical Therapy

## 2023-09-12 DIAGNOSIS — C9000 Multiple myeloma not having achieved remission: Secondary | ICD-10-CM | POA: Diagnosis not present

## 2023-09-12 DIAGNOSIS — M25552 Pain in left hip: Secondary | ICD-10-CM

## 2023-09-12 DIAGNOSIS — R293 Abnormal posture: Secondary | ICD-10-CM | POA: Diagnosis not present

## 2023-09-12 DIAGNOSIS — R252 Cramp and spasm: Secondary | ICD-10-CM

## 2023-09-12 DIAGNOSIS — R262 Difficulty in walking, not elsewhere classified: Secondary | ICD-10-CM | POA: Diagnosis not present

## 2023-09-12 DIAGNOSIS — M6281 Muscle weakness (generalized): Secondary | ICD-10-CM

## 2023-09-12 DIAGNOSIS — M542 Cervicalgia: Secondary | ICD-10-CM | POA: Diagnosis not present

## 2023-09-12 DIAGNOSIS — M546 Pain in thoracic spine: Secondary | ICD-10-CM | POA: Diagnosis not present

## 2023-09-12 DIAGNOSIS — S22000A Wedge compression fracture of unspecified thoracic vertebra, initial encounter for closed fracture: Secondary | ICD-10-CM | POA: Diagnosis not present

## 2023-09-12 DIAGNOSIS — M5459 Other low back pain: Secondary | ICD-10-CM

## 2023-09-13 NOTE — Telephone Encounter (Signed)
Patient is calling to follow up

## 2023-09-13 NOTE — Therapy (Incomplete)
 OUTPATIENT PHYSICAL THERAPY ORTHOPEDIC TREATMENT     Patient Name: Travis [name] MRN: [id number] DOB:[birth year], 73 y.o., male Today's Date: 09/13/2023  END OF SESSION:         Past Medical History:  Diagnosis Date   A-fib [facility])    Arthritis    "comes and goes; mostly in hands, occasionally elbow" (04/05/2017)   Complication of anesthesia    "just wanted to sleep alot  for couple days S/P VARICOCELE OR" (04/05/2017)   DVT (deep venous thrombosis) (HCC)    LLE   Dysrhythmia    PVCs    GERD (gastroesophageal reflux disease)    History of hiatal hernia    History of radiation therapy    Thoracic Spine- 07/13/22-07/23/22-Dr. Retta Caster   Hypertension    Impaired glucose tolerance    Multiple myeloma (HCC)    Obesity (BMI 30-39.9) 07/26/2016   OSA on CPAP 07/26/2016   Mild with AHI 12.5/hr now on CPAP at 14cm H2O   PAF (paroxysmal atrial fibrillation) (HCC)    s/p PVI Ablation in 2018 // Flecainide  Rx // Echo 8/21: EF 60-65, no RWMA, mild LVH, normal RV SF, moderate RVE, mild BAE, trivial MR, mild dilation of aortic root (40 mm)   Pneumonia    "may have had walking pneumonia a few years ago" (04/05/2017)   Pre-diabetes    Thrombophlebitis    Past Surgical History:  Procedure Laterality Date   ATRIAL FIBRILLATION ABLATION  04/05/2017   ATRIAL FIBRILLATION ABLATION N/A 04/05/2017   Procedure: ATRIAL FIBRILLATION ABLATION;  Surgeon: Jolly Needle, MD;  Location: [facility];  Service: Cardiovascular;  Laterality: N/A;   BONE BIOPSY Right 12/17/2021   Procedure: BONE BIOPSY;  Surgeon: Micheline Ahr, MD;  Location: [facility];  Service: Orthopedics;  Laterality: Right;   COMPLETE RIGHT HIP REPLACMENT  01/19/2019   EVALUATION UNDER ANESTHESIA WITH HEMORRHOIDECTOMY N/A 02/24/2017   Procedure: EXAM UNDER ANESTHESIA WITH  HEMORRHOIDECTOMY;  Surgeon: Candyce Champagne, MD;  Location: [facility];  Service: General;  Laterality: N/A;   EXCISIONAL TOTAL HIP  ARTHROPLASTY WITH ANTIBIOTIC SPACERS Right 09/25/2020   Procedure: EXCISIONAL TOTAL HIP ARTHROPLASTY WITH ANTIBIOTIC SPACERS;  Surgeon: Claiborne Crew, MD;  Location: [facility];  Service: Orthopedics;  Laterality: Right;   FLEXIBLE SIGMOIDOSCOPY N/A 04/15/2015   Procedure:  UNSEDATED FLEXIBLE SIGMOIDOSCOPY;  Surgeon: Garrett Kallman, MD;  Location: [facility];  Service: Endoscopy;  Laterality: N/A;   HUMERUS IM NAIL Right 12/17/2021   Procedure: INTRAMEDULLARY (IM) NAIL HUMERAL;  Surgeon: Micheline Ahr, MD;  Location: [facility];  Service: Orthopedics;  Laterality: Right;   KNEE ARTHROSCOPY Left ~ 2016   POSTERIOR CERVICAL FUSION/FORAMINOTOMY N/A 06/04/2022   Procedure: Thoracic one - Thoracic Four Posterior lateral arthrodesis/ fusion and Thoracic two Corpectomy;  Surgeon: Audie Bleacher, MD;  Location: [facility];  Service: Neurosurgery;  Laterality: N/A;   REIMPLANTATION OF TOTAL HIP Right 12/25/2020   Procedure: REIMPLANTATION/REVISION OF RIGHT TOTAL HIP VERSUS REPEAT IRRIGATION AND DEBRIDEMENT;  Surgeon: Claiborne Crew, MD;  Location: [facility];  Service: Orthopedics;  Laterality: Right;   TESTICLE SURGERY  1988   VARICOCELE   TONSILLECTOMY     VARICOSE VEIN SURGERY Bilateral    Patient Active Problem List   Diagnosis Date Noted   Pancreatic cyst 09/30/2022   Infection due to novel influenza A virus 09/29/2022   Persistent atrial fibrillation (HCC) 07/15/2022   Acute on chronic diastolic heart failure (HCC) 07/03/2022   Pulmonary infiltrates 07/01/2022  Acute respiratory failure with hypoxia (HCC) 07/01/2022   Hyponatremia 06/29/2022   History of thoracic surgery 06/29/2022   Acute DVT of left tibial vein (HCC) 06/21/2022   Hypokalemia 06/21/2022   Vitamin D  deficiency 05/30/2022   Other constipation 05/29/2022   Pathologic compression fracture of spine, initial encounter (HCC) 05/29/2022   Cord compression (HCC) 05/28/2022   Degenerative cervical disc 05/14/2022    Hypercoagulable state due to persistent atrial fibrillation (HCC) 01/11/2022   Multiple myeloma (HCC) 12/10/2021   Medication monitoring encounter 02/27/2021   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   Status post peripherally inserted central catheter (PICC) central line placement 11/06/2020   Drug rash with eosinophilia and systemic symptoms syndrome 11/06/2020   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   PAF (paroxysmal atrial fibrillation) (HCC)    GERD (gastroesophageal reflux disease) 03/29/2018   Hiatal hernia 03/29/2018   History of DVT (deep vein thrombosis) 03/29/2018   CAP (community acquired pneumonia) 03/29/2018   Osteoarthritis 03/29/2018   Neuropathy 08/29/2017   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   Paroxysmal atrial fibrillation with rapid ventricular response (HCC) 04/05/2017   Prolapsed internal hemorrhoids, grade 4, s/p ligation/pexy/hemorrhoidectomy x 2 02/24/2017 02/24/2017   OSA (obstructive sleep apnea) 07/26/2016   Obesity (BMI 30-39.9) 07/26/2016   Atrial fibrillation with RVR (HCC)    Essential hypertension    Chest pain at rest     PCP: Charlene Conners, MD  REFERRING PROVIDER: Gery Kubas, NP  REFERRING DIAG: R53.81 (ICD-10-CM) - Physical deconditioning C90.00 (ICD-10-CM) - Multiple myeloma myelomatosis (HCC)  Rationale for Evaluation and Treatment: Rehabilitation  THERAPY DIAG:  No diagnosis found.  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 Yes Pain location: upper back 3/10 PAIN TYPE: aching, burning, and numbness (foot) Pain description: intermittent and constant  Aggravating factors: exercise, walking - makes me sore Relieving factors: the band exercises help my back   PRECAUTIONS: Other: NO HEAVY RESISTANCE OR WEIGHT LIFTING GIVEN CANCER WITH BONE METS, FALL RISK  RED FLAGS: Cancer with bone mets, fall risk   WEIGHT BEARING RESTRICTIONS: No  FALLS:  Has patient fallen in last 6 months? Yes. Number of falls 0  LIVING ENVIRONMENT: Lives with: lives with their family and lives with their spouse Lives in: House/apartment Stairs: Yes; Internal: 14 steps; on left going up and External: 3 steps; on right going up Has following equipment at home: Single point cane and Walker - 4 wheeled - rollator   OCCUPATION: semi-retired, attorney   PLOF: Independent with household mobility with device   PATIENT GOALS: LE strength, loosen up back  NEXT MD VISIT: tomorrow  OBJECTIVE:  Note: Objective measures were completed at Evaluation unless otherwise noted.   PATIENT SURVEYS:  Objective measures for fall risk and deconditioning diagnosis - see functional tests  COGNITION: Overall cognitive status: Within functional limits for tasks assessed     SENSATION: Whole foot Rt>Lt numbness related to neuropathy   POSTURE: rounded shoulders, forward head, decreased lumbar lordosis, and increased thoracic kyphosis   TRUNK ROM:    Flexes trunk with fingers to mid-shin with knees bent and holds onto counter Trunk rotation bil 50% Neck rotation bil 50% Neck extension - remains flexed to 5 deg  LOWER  EXTREMITY MMT:   Rt hip 4-/5 Lt hip 4/5 Bil knees 4-/5   FUNCTIONAL TESTS:  09/07/23: 5X STS: 13.32 sec chair + pad with light UE on thighs TUG: 13.14 sec no cane Walk test: Attempted with intermittent use of SPC - made it 4:15 covering 636'  Eval: 5 times sit to stand: 19.14 heavy use of UE on chair to rise and control descent Timed up and go (TUG): 18.28 with SPC and use of hands on chair  3 minute walk test: 86' with SCP 10 meter walk test: 12.62 sec with SPC  GAIT: Distance walked: 401 Assistive device utilized: Single point cane Level of assistance: Modified independence Comments: lacks trunk rotation, wide BOS  TREATMENT DATE:  09/14/23: NuStep L5 x5' Standing cable bil row 25lb x20 Single arm cable row with ipsilateral trunk rotation 10lb x10 each UE Backward resistance walking x 5 reps 15lb with PT providing close supervision for safety LAQ 5lb 2x10 4" fwd step ups with bil UE support 1x10 each LE Seated modified Russian twist x 20 with 8 lbs Seated shoulder to hip x 10 each side with 8 lbs Sit to stand chair + pad 2x5 - no UE support  6" (on stairs) lateral hip hike with bil UE support 2x10 each LE Lateral step over low hurdle and back 2x5 bil UE support Fwd march over low hurdles along barre x3 laps Standing march double tap cone with bil UE support 3 direction ball roll outs x 10 each direction: seated in chair at barre Seated HS curls blue 2x10  09/12/23: NuStep L5 x5' Standing cable bil row 25lb x20 Single arm cable row with ipsilateral trunk rotation 10lb x10 each UE Backward resistance walking x 5 reps 15lb with PT providing close supervision for safety LAQ 5lb 2x10 4" fwd step ups with bil UE support 1x10 each LE Seated modified Russian twist x 20 with 8 lbs Seated shoulder to hip x 10 each side with 8 lbs Sit to stand chair + pad 2x5 - no UE support  6" (on  stairs) lateral hip hike with bil UE support 2x10 each LE Lateral step over low hurdle  and back 2x5 bil UE support Fwd march over low hurdles along barre x3 laps Standing march double tap cone with bil UE support 3 direction ball roll outs x 10 each direction: seated in chair at barre Seated HS curls blue 2x10   09/07/23: Attempted with intermittent use of SPC - made it 4:15 covering 636' - Rt hip pain limited NuStep L5 x5' Standing cable bil row 25lb x10 Single arm cable row with ipsilateral trunk rotation 15lb x10 each UE TUG and 5xSTS Backward resistance walking x 5 reps 15lb with PT providing close supervision for safety LAQ 5lb 2x10 4" fwd step ups with bil UE support 1x10 each LE Seated modified Russian twist x 20 with 8 lbs Seated shoulder to hip x 10 each side with 8 lbs 4" lateral hip hike with bil UE support 2x10 each LE Fwd march over low hurdles along barre x3 laps Standing march double tap cone with bil UE support Lateral step over low hurdle and back 2x5 bil UE support   09/05/23: Nustep L 5 x 8 min 3 direction ball roll outs x 10 each direction: seated in chair at barre Thoracic extension (gentle) with purple ball behind back in chair x 10 holding approx 5 sec Standing 3lbs in hand overhead with opposite leg lift 2 x 10 each side (Patient still having cramp in side of foot) tried rocker board to alleviate this spasm x 2 min Seated lumbar extension with purple (heavy) serious steel band 2 x 10 Lateral band walks at barre with yellow loop x 5 laps Seated clam with yellow loop x 20 Seated mini sit up with 8 lb dumbbell 2 x 10 Seated modified Russian twist x 20 with 8 lbs Seated shoulder to hip x 10 each side with 8 lbs Heel/toe raises with UE support 2 x 10 Standing hip abduction x 20 each LE Standing hip extension x 20 each LE Standing march with min right UE support x20 Sit to stand chair + pad x10 - no UE support                           PATIENT EDUCATION:  Education details: [email or web address] Person educated: Patient Education method: Programmer, multimedia,  Facilities manager, Verbal cues, and Handouts Education comprehension: verbalized understanding and returned demonstration  HOME EXERCISE PROGRAM: Access Code: [email or web address] URL: [email or web address] Date: 08/09/2023 Prepared by: Minor Amble Beuhring  Exercises - Seated Cervical Rotation AROM  - 1 x daily - 7 x weekly - 1 sets - 10 reps - 10 hold - Seated Cervical Sidebending AROM  - 1 x daily - 7 x weekly - 1 sets - 10 reps - 5 hold - Seated Cervical Extension AROM  - 1 x daily - 7 x weekly - 1 sets - 10 reps - 10 hold - Seated Scapular Retraction  - 1 x daily - 7 x weekly - 1-3 sets - 10 reps - 2-3 sec hold - Sidelying Thoracic Rotation with Open Book  - 1 x daily - 7 x weekly - 2 sets - 5 reps - Seated Thoracic Extension Arms Overhead  - 1 x daily - 7 x weekly - 1-2 sets - 10 reps - 2-3 sec hold - Seated Trunk Rotation  - 1 x daily - 7 x weekly - 1-2 sets - 10 reps - 2-3 sec hold - Seated Diagonal Chops  with Medicine Ball  - 1 x daily - 7 x weekly - 1-2 sets - 10 reps - 2-3 hold - Seated Long Arc Quad  - 1 x daily - 7 x weekly - 3 sets - 10 reps - Seated March  - 1 x daily - 7 x weekly - 3 sets - 10 reps - Seated Hip External Rotation AROM  - 1 x daily - 7 x weekly - 3 sets - 10 reps - Supine Hamstring Stretch with Strap  - 1 x daily - 7 x weekly - 1 sets - 3 reps - 20 sec hold - Supine ITB Stretch with Strap  - 1 x daily - 7 x weekly - 1 sets - 3 reps - 20 sec hold - Seated Piriformis Stretch  - 1 x daily - 7 x weekly - 1 sets - 3 reps - 30 sec hold - Seated Hip Adduction Squeeze with Ball  - 1 x daily - 7 x weekly - 3 sets - 10 reps - 2-3 sec hold - Seated 3 Way Exercise [employer] Out Stretch  - 1 x daily - 7 x weekly - 1 sets - 10 reps - Seated Long Arc Quad with Ankle Weight  - 1 x daily - 7 x weekly - 2 sets - 10 reps - Seated Hip Flexion March with Ankle Weights  - 1 x daily - 7 x weekly - 2 sets - 10 reps - Seated Hip External Rotation AROM  - 1 x daily - 7 x weekly - 2 sets -  10 reps - Seated Heel Toe Raises  - 1 x daily - 7 x weekly - 2 sets - 10 reps - Standing Hip Abduction with Counter Support  - 1 x daily - 7 x weekly - 2 sets - 10 reps - Standing Hip Extension with Counter Support  - 1 x daily - 7 x weekly - 2 sets - 10 reps - Heel Raises with Counter Support  - 1 x daily - 7 x weekly - 2 sets - 10 reps - Sit to Stand with Armchair  - 5 x daily - 7 x weekly - 1 sets - 3-5 [id number]  ASSESSMENT:  CLINICAL IMPRESSION: ***  OBJECTIVE IMPAIRMENTS: Abnormal gait, cardiopulmonary status limiting activity, decreased activity tolerance, decreased balance, decreased coordination, decreased endurance, decreased mobility, difficulty walking, decreased ROM, decreased strength, hypomobility, increased edema, impaired flexibility, impaired sensation, impaired UE functional use, improper body mechanics, postural dysfunction, and pain.   ACTIVITY LIMITATIONS: carrying, lifting, bending, sitting, standing, squatting, sleeping, stairs, transfers, bed mobility, bathing, toileting, dressing, hygiene/grooming, and locomotion level  PARTICIPATION LIMITATIONS: meal prep, cleaning, laundry, shopping, and community activity  PERSONAL FACTORS: Age, Time since onset of injury/illness/exacerbation, and 3+ comorbidities: a-fib, history of pathological fractures, fall risk, bone mets, neuropathy of bil LE are also affecting patient's functional outcome.   REHAB POTENTIAL: Good  CLINICAL DECISION MAKING: Evolving/moderate complexity  EVALUATION COMPLEXITY: Moderate   GOALS: Goals reviewed with patient? Yes  SHORT TERM GOALS: Target date: 09/06/23  Pt will be safe and ind with initial HEP Baseline: Goal status: MET 4/21  2.  Pt will be able to perform 5-10 reps of sit to stand without use of UE on thighs with controlled descent into chair Baseline:  Goal status: MET 08/31/23  3.  Pt will report reduced thoracic spine pain by at least 25% Baseline:  Goal status:  MET 08/31/23  4.  Pt will improve TUG test to 16 sec  or less using SPC Baseline: 18.28 Goal status: MET 5/7    LONG TERM GOALS: Target date: 10/04/23  Pt will demo safety and ind with HEP and understand the importance of ongoing compliance upon d/c from PT Baseline:  Goal status: INITIAL  2.  Pt will report reduced thoracic spine pain by at least 50%. Baseline:  Goal status: INITIAL  3.  Pt will cover at least 800' with [facility] in a to improve endurance for community outings. Baseline: 36' in with SPC Goal status: ONGOING 5/7 Attempted with intermittent use of SPC - made it 4:15 covering 636'  4.  Pt will improve mobility to allow him to safely reach for object on floor using light UE support Baseline:  Goal status: INITIAL  5.  Pt will improve speed with 10 meter walk test to 11.5 sec Baseline: 12.62 Goal status: INITIAL  6.  Pt will improve 5X STS to 16 sec or less without use of UE on chair to demo improved functional strength and reduced fall risk. Baseline:  Goal status: MET 5/7  PLAN:  PT FREQUENCY: 2x/week  PT DURATION: 8 weeks  PLANNED INTERVENTIONS: 97110-Therapeutic exercises, 97530- Therapeutic activity, 97112- Neuromuscular re-education, 97535- Self Care, 10272- Manual therapy, [phone]- Gait training, Patient/Family education, Balance training, Stair training, Dry Needling, Joint mobilization, Spinal mobilization, Cryotherapy, and Moist heat.  PLAN FOR NEXT SESSION: 10th visit note. Avoid supine secondary to back pain. Nustep, review HEP (PT added LE strength to already existing HEP from last episode of care), gait endurance, sit to stand, HS flexibility, functional body weight strength and trunk mobility - no heavy lifting due to bone mets  Jinx Mourning, PT  09/13/23 9:10 PM  [facility] [address] [place], [place] [address] Phone # [phone] Fax [phone]

## 2023-09-14 ENCOUNTER — Ambulatory Visit: Admitting: Physical Therapy

## 2023-09-15 ENCOUNTER — Encounter (HOSPITAL_BASED_OUTPATIENT_CLINIC_OR_DEPARTMENT_OTHER): Payer: Self-pay | Admitting: Cardiology

## 2023-09-15 ENCOUNTER — Other Ambulatory Visit: Payer: Self-pay | Admitting: Internal Medicine

## 2023-09-15 DIAGNOSIS — I10 Essential (primary) hypertension: Secondary | ICD-10-CM

## 2023-09-15 DIAGNOSIS — G4733 Obstructive sleep apnea (adult) (pediatric): Secondary | ICD-10-CM

## 2023-09-15 NOTE — Telephone Encounter (Signed)
 Called pt  Pt still with swelling in face after steroids His legs aren't bad  His breathing is OK WOuld recomm taking 5 mg metolazone  tomorrow before the torsemide    Follow response    Would not do all the time    Keep up to date through epic

## 2023-09-19 ENCOUNTER — Other Ambulatory Visit: Payer: Self-pay | Admitting: *Deleted

## 2023-09-19 ENCOUNTER — Encounter: Payer: Self-pay | Admitting: Physical Therapy

## 2023-09-19 ENCOUNTER — Other Ambulatory Visit: Payer: Self-pay | Admitting: Internal Medicine

## 2023-09-19 ENCOUNTER — Ambulatory Visit: Admitting: Physical Therapy

## 2023-09-19 DIAGNOSIS — R262 Difficulty in walking, not elsewhere classified: Secondary | ICD-10-CM

## 2023-09-19 DIAGNOSIS — M5459 Other low back pain: Secondary | ICD-10-CM | POA: Diagnosis not present

## 2023-09-19 DIAGNOSIS — M546 Pain in thoracic spine: Secondary | ICD-10-CM | POA: Diagnosis not present

## 2023-09-19 DIAGNOSIS — M6281 Muscle weakness (generalized): Secondary | ICD-10-CM | POA: Diagnosis not present

## 2023-09-19 DIAGNOSIS — S22000A Wedge compression fracture of unspecified thoracic vertebra, initial encounter for closed fracture: Secondary | ICD-10-CM | POA: Diagnosis not present

## 2023-09-19 DIAGNOSIS — R252 Cramp and spasm: Secondary | ICD-10-CM | POA: Diagnosis not present

## 2023-09-19 DIAGNOSIS — M542 Cervicalgia: Secondary | ICD-10-CM | POA: Diagnosis not present

## 2023-09-19 DIAGNOSIS — R293 Abnormal posture: Secondary | ICD-10-CM

## 2023-09-19 DIAGNOSIS — C9000 Multiple myeloma not having achieved remission: Secondary | ICD-10-CM | POA: Diagnosis not present

## 2023-09-19 DIAGNOSIS — M25552 Pain in left hip: Secondary | ICD-10-CM | POA: Diagnosis not present

## 2023-09-19 MED ORDER — ACYCLOVIR 400 MG PO TABS
400.0000 mg | ORAL_TABLET | Freq: Two times a day (BID) | ORAL | 5 refills | Status: AC
Start: 2023-09-19 — End: ?

## 2023-09-19 NOTE — Therapy (Signed)
 OUTPATIENT PHYSICAL THERAPY ORTHOPEDIC TREATMENT    Progress Note Reporting Period 08/09/23 to 09/19/23  See note below for Objective Data and Assessment of Progress/Goals.      Patient Name: Travis [name] MRN: [id number] DOB:1951/01/31, 73 y.o., male Today's Date: 09/19/2023  END OF SESSION:  PT End of Session - 09/19/23 1110     Visit Number 10    Date for PT Re-Evaluation 10/04/23    Authorization Type [employer] MCR    Progress Note Due on Visit 20    PT Start Time 1104    PT Stop Time 1144    PT Time Calculation (min) 40 min    Activity Tolerance Patient tolerated treatment well    Behavior During Therapy WFL for tasks assessed/performed                   Past Medical History:  Diagnosis Date   A-fib (HCC)    Arthritis    "comes and goes; mostly in hands, occasionally elbow" (04/05/2017)   Complication of anesthesia    "just wanted to sleep alot  for couple days S/P VARICOCELE OR" (04/05/2017)   DVT (deep venous thrombosis) (HCC)    LLE   Dysrhythmia    PVCs    GERD (gastroesophageal reflux disease)    History of hiatal hernia    History of radiation therapy    Thoracic Spine- 07/13/22-07/23/22-Dr. Retta Caster   Hypertension    Impaired glucose tolerance    Multiple myeloma (HCC)    Obesity (BMI 30-39.9) 07/26/2016   OSA on CPAP 07/26/2016   Mild with AHI 12.5/hr now on CPAP at 14cm H2O   PAF (paroxysmal atrial fibrillation) (HCC)    s/p PVI Ablation in 2018 // Flecainide  Rx // Echo 8/21: EF 60-65, no RWMA, mild LVH, normal RV SF, moderate RVE, mild BAE, trivial MR, mild dilation of aortic root (40 mm)   Pneumonia    "may have had walking pneumonia a few years ago" (04/05/2017)   Pre-diabetes    Thrombophlebitis    Past Surgical History:  Procedure Laterality Date   ATRIAL FIBRILLATION ABLATION  04/05/2017   ATRIAL FIBRILLATION ABLATION N/A 04/05/2017   Procedure: ATRIAL FIBRILLATION ABLATION;  Surgeon: Jolly Needle, MD;  Location: [facility];  Service: Cardiovascular;  Laterality: N/A;   BONE BIOPSY Right 12/17/2021   Procedure: BONE BIOPSY;  Surgeon: Micheline Ahr, MD;  Location: [facility];  Service: Orthopedics;  Laterality: Right;   COMPLETE RIGHT HIP REPLACMENT  01/19/2019   EVALUATION UNDER ANESTHESIA WITH HEMORRHOIDECTOMY N/A 02/24/2017   Procedure: EXAM UNDER ANESTHESIA WITH  HEMORRHOIDECTOMY;  Surgeon: Candyce Champagne, MD;  Location: [facility];  Service: General;  Laterality: N/A;   EXCISIONAL TOTAL HIP ARTHROPLASTY WITH ANTIBIOTIC SPACERS Right 09/25/2020   Procedure: EXCISIONAL TOTAL HIP ARTHROPLASTY WITH ANTIBIOTIC SPACERS;  Surgeon: Claiborne Crew, MD;  Location: [facility];  Service: Orthopedics;  Laterality: Right;   FLEXIBLE SIGMOIDOSCOPY N/A 04/15/2015   Procedure:  UNSEDATED FLEXIBLE SIGMOIDOSCOPY;  Surgeon: Garrett Kallman, MD;  Location: [facility];  Service: Endoscopy;  Laterality: N/A;   HUMERUS IM NAIL Right 12/17/2021   Procedure: INTRAMEDULLARY (IM) NAIL HUMERAL;  Surgeon: Micheline Ahr, MD;  Location: [facility];  Service: Orthopedics;  Laterality: Right;   KNEE ARTHROSCOPY Left ~ 2016   POSTERIOR CERVICAL FUSION/FORAMINOTOMY N/A 06/04/2022   Procedure: Thoracic one - Thoracic Four Posterior lateral arthrodesis/ fusion and Thoracic two Corpectomy;  Surgeon: Audie Bleacher, MD;  Location:  [facility];  Service: Neurosurgery;  Laterality: N/A;   REIMPLANTATION OF TOTAL HIP Right 12/25/2020   Procedure: REIMPLANTATION/REVISION OF RIGHT TOTAL HIP VERSUS REPEAT IRRIGATION AND DEBRIDEMENT;  Surgeon: Claiborne Crew, MD;  Location: [facility];  Service: Orthopedics;  Laterality: Right;   TESTICLE SURGERY  1988   VARICOCELE   TONSILLECTOMY     VARICOSE VEIN SURGERY Bilateral    Patient Active Problem List   Diagnosis Date Noted   Pancreatic cyst 09/30/2022   Infection due to novel influenza A virus 09/29/2022   Persistent atrial fibrillation (HCC) 07/15/2022   Acute on chronic diastolic heart failure  (HCC) 11/91/4782   Pulmonary infiltrates 07/01/2022   Acute respiratory failure with hypoxia (HCC) 07/01/2022   Hyponatremia 06/29/2022   History of thoracic surgery 06/29/2022   Acute DVT of left tibial vein (HCC) 06/21/2022   Hypokalemia 06/21/2022   Vitamin D  deficiency 05/30/2022   Other constipation 05/29/2022   Pathologic compression fracture of spine, initial encounter (HCC) 05/29/2022   Cord compression (HCC) 05/28/2022   Degenerative cervical disc 05/14/2022   Hypercoagulable state due to persistent atrial fibrillation (HCC) 01/11/2022   Multiple myeloma (HCC) 12/10/2021   Medication monitoring encounter 02/27/2021   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   Status post peripherally inserted central catheter (PICC) central line placement 11/06/2020   Drug rash with eosinophilia and systemic symptoms syndrome 11/06/2020   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   PAF (paroxysmal atrial fibrillation) (HCC)    GERD (gastroesophageal reflux disease) 03/29/2018   Hiatal hernia 03/29/2018   History of DVT (deep vein thrombosis) 03/29/2018   CAP (community acquired pneumonia) 03/29/2018   Osteoarthritis 03/29/2018   Neuropathy 08/29/2017   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   Paroxysmal atrial fibrillation with rapid ventricular response (HCC) 04/05/2017   Prolapsed internal hemorrhoids, grade 4, s/p ligation/pexy/hemorrhoidectomy x 2 02/24/2017 02/24/2017   OSA (obstructive sleep apnea) 07/26/2016   Obesity (BMI 30-39.9) 07/26/2016   Atrial fibrillation with RVR (HCC)    Essential hypertension    Chest pain at rest     PCP: Charlene Conners, MD  REFERRING PROVIDER: Gery Kubas, NP  REFERRING DIAG: R53.81 (ICD-10-CM) - Physical deconditioning C90.00 (ICD-10-CM) - Multiple myeloma myelomatosis (HCC)  Rationale for Evaluation and Treatment: Rehabilitation  THERAPY DIAG:  Abnormal posture  Other low back pain  Muscle weakness  (generalized)  Cramp and spasm  Difficulty in walking, not elsewhere classified  ONSET DATE: chronic  SUBJECTIVE:  SUBJECTIVE STATEMENT: I fell in the garage yesterday.  Just scraped my elbow.  My legs just gave out on me - this is the third time that's happened.  I am using the rolling walker since I started feeling like my legs are getting weaker and giving out.  I feel like I'm going backwards.  The spine pain at my neck is worsening and my legs are feeling weaker. Pt arrives with rolling walker. Fri I have another MRI of my neck/thoracic spine at [facility] and then a follow up with local neurosurgeon after that.  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 Yes Pain location: upper back 3/10 PAIN TYPE: aching, burning, and numbness (foot) Pain description: intermittent and constant  Aggravating factors: exercise, walking - makes me sore Relieving factors: the band exercises help my back   PRECAUTIONS: Other: NO HEAVY RESISTANCE OR WEIGHT LIFTING GIVEN CANCER WITH BONE METS, FALL RISK  RED FLAGS: Cancer with bone mets, fall risk   WEIGHT BEARING RESTRICTIONS: No  FALLS:  Has patient fallen in last 6 months? Yes. Number of falls 0  LIVING ENVIRONMENT: Lives  with: lives with their family and lives with their spouse Lives in: House/apartment Stairs: Yes; Internal: 14 steps; on left going up and External: 3 steps; on right going up Has following equipment at home: Single point cane and Walker - 4 wheeled - rollator   OCCUPATION: semi-retired, attorney   PLOF: Independent with household mobility with device   PATIENT GOALS: LE strength, loosen up back  NEXT MD VISIT: tomorrow  OBJECTIVE:  Note: Objective measures were completed at Evaluation unless otherwise noted.   PATIENT SURVEYS:  Objective measures for fall risk and deconditioning diagnosis - see functional tests  COGNITION: Overall cognitive status: Within functional limits for tasks assessed     SENSATION: Whole foot Rt>Lt numbness related to neuropathy   POSTURE: rounded shoulders, forward head, decreased lumbar lordosis, and increased thoracic kyphosis   TRUNK ROM:    09/19/23: deferred secondary to a fall yesterday with increased pain and feeling weak   Eval: Flexes trunk with fingers to mid-shin with knees bent and holds onto counter Trunk rotation bil 50% Neck rotation bil 50% Neck extension - remains flexed to 5 deg  LOWER EXTREMITY MMT:   09/19/23: Rt hip: 4/5 Lt hip: 4+/5 Bil knees 4/5  Eval: Rt hip 4-/5 Lt hip 4/5 Bil knees 4-/5   FUNCTIONAL TESTS:  09/19/23: 10 meter walk test: deferred secondary to a fall yesterday with increased pain and feeling weak 6 MWT: deferred secondary to a fall yesterday with increased pain and feeling weak   09/07/23: 5X STS: 13.32 sec chair + pad with light UE on thighs TUG: 13.14 sec no cane Walk test: Attempted with intermittent use of SPC - made it 4:15 covering 636'  Eval: 5 times sit to stand: 19.14 heavy use of UE on chair to rise and control descent Timed up and go (  TUG): 18.28 with SPC and use of hands on chair  3 minute walk test: 73' with SCP 10 meter walk test: 12.62 sec with SPC  GAIT: Distance  walked: 401 Assistive device utilized: Single point cane Level of assistance: Modified independence Comments: lacks trunk rotation, wide BOS  TREATMENT DATE:  09/19/23: NuStep L1 x5', L5 x 5' - PT present to monitor  Seated LAQ 2x10 5lb Seated purple power cord HS curls 2x10 Leg press seat 9 75lb x 30 Seated row cable pulleys x 20, 15lb Seated core series 3lb dumbbell: hip to hip x20, hip to shoulder x10 Gentle cervical SNAG for bil rotation x10, extension to neutral x10  09/14/23: NuStep L5 x5' Standing cable bil row 25lb x20 Single arm cable row with ipsilateral trunk rotation 10lb x10 each UE Backward resistance walking x 5 reps 15lb with PT providing close supervision for safety LAQ 5lb 2x10 4" fwd step ups with bil UE support 1x10 each LE Seated modified Russian twist x 20 with 8 lbs Seated shoulder to hip x 10 each side with 8 lbs Sit to stand chair + pad 2x5 - no UE support  6" (on stairs) lateral hip hike with bil UE support 2x10 each LE Lateral step over low hurdle and back 2x5 bil UE support Fwd march over low hurdles along barre x3 laps Standing march double tap cone with bil UE support 3 direction ball roll outs x 10 each direction: seated in chair at barre Seated HS curls blue 2x10  09/12/23: NuStep L5 x5' Standing cable bil row 25lb x20 Single arm cable row with ipsilateral trunk rotation 10lb x10 each UE Backward resistance walking x 5 reps 15lb with PT providing close supervision for safety LAQ 5lb 2x10 4" fwd step ups with bil UE support 1x10 each LE Seated modified Russian twist x 20 with 8 lbs Seated shoulder to hip x 10 each side with 8 lbs Sit to stand chair + pad 2x5 - no UE support  6" (on stairs) lateral hip hike with bil UE support 2x10 each LE Lateral step over low hurdle and back 2x5 bil UE support Fwd march over low hurdles along barre x3 laps Standing march double tap cone with bil UE support 3 direction ball roll outs x 10 each direction:  seated in chair at barre Seated HS curls blue 2x10   09/07/23: Attempted with intermittent use of SPC - made it 4:15 covering 636' - Rt hip pain limited NuStep L5 x5' Standing cable bil row 25lb x10 Single arm cable row with ipsilateral trunk rotation 15lb x10 each UE TUG and 5xSTS Backward resistance walking x 5 reps 15lb with PT providing close supervision for safety LAQ 5lb 2x10 4" fwd step ups with bil UE support 1x10 each LE Seated modified Russian twist x 20 with 8 lbs Seated shoulder to hip x 10 each side with 8 lbs 4" lateral hip hike with bil UE support 2x10 each LE Fwd march over low hurdles along barre x3 laps Standing march double tap cone with bil UE support Lateral step over low hurdle and back 2x5 bil UE support                    PATIENT EDUCATION:  Education details: [email or web address] Person educated: Patient Education method: Programmer, multimedia, Demonstration, Verbal cues, and Handouts Education comprehension: verbalized understanding and returned demonstration  HOME EXERCISE PROGRAM: Access Code: [email or web address] URL: [email or web address] Date: 08/09/2023 Prepared by: Raynell Caller  Exercises - Seated Cervical  Rotation AROM  - 1 x daily - 7 x weekly - 1 sets - 10 reps - 10 hold - Seated Cervical Sidebending AROM  - 1 x daily - 7 x weekly - 1 sets - 10 reps - 5 hold - Seated Cervical Extension AROM  - 1 x daily - 7 x weekly - 1 sets - 10 reps - 10 hold - Seated Scapular Retraction  - 1 x daily - 7 x weekly - 1-3 sets - 10 reps - 2-3 sec hold - Sidelying Thoracic Rotation with Open Book  - 1 x daily - 7 x weekly - 2 sets - 5 reps - Seated Thoracic Extension Arms Overhead  - 1 x daily - 7 x weekly - 1-2 sets - 10 reps - 2-3 sec hold - Seated Trunk Rotation  - 1 x daily - 7 x weekly - 1-2 sets - 10 reps - 2-3 sec hold - Seated Diagonal Chops with Medicine Ball  - 1 x daily - 7 x weekly - 1-2 sets - 10 reps - 2-3 hold - Seated Long Arc Quad  - 1 x daily - 7 x  weekly - 3 sets - 10 reps - Seated March  - 1 x daily - 7 x weekly - 3 sets - 10 reps - Seated Hip External Rotation AROM  - 1 x daily - 7 x weekly - 3 sets - 10 reps - Supine Hamstring Stretch with Strap  - 1 x daily - 7 x weekly - 1 sets - 3 reps - 20 sec hold - Supine ITB Stretch with Strap  - 1 x daily - 7 x weekly - 1 sets - 3 reps - 20 sec hold - Seated Piriformis Stretch  - 1 x daily - 7 x weekly - 1 sets - 3 reps - 30 sec hold - Seated Hip Adduction Squeeze with Ball  - 1 x daily - 7 x weekly - 3 sets - 10 reps - 2-3 sec hold - Seated 3 Way Exercise [employer] Out Stretch  - 1 x daily - 7 x weekly - 1 sets - 10 reps - Seated Long Arc Quad with Ankle Weight  - 1 x daily - 7 x weekly - 2 sets - 10 reps - Seated Hip Flexion March with Ankle Weights  - 1 x daily - 7 x weekly - 2 sets - 10 reps - Seated Hip External Rotation AROM  - 1 x daily - 7 x weekly - 2 sets - 10 reps - Seated Heel Toe Raises  - 1 x daily - 7 x weekly - 2 sets - 10 reps - Standing Hip Abduction with Counter Support  - 1 x daily - 7 x weekly - 2 sets - 10 reps - Standing Hip Extension with Counter Support  - 1 x daily - 7 x weekly - 2 sets - 10 reps - Heel Raises with Counter Support  - 1 x daily - 7 x weekly - 2 sets - 10 reps - Sit to Stand with Armchair  - 5 x daily - 7 x weekly - 1 sets - 3-5 [id number]  ASSESSMENT:  CLINICAL IMPRESSION: Pt has returned to using rolling walker as he has had three episodes recently where knees have buckled resulting in one fall yesterday in garage.  He is not concerned about any new fractures, just scraped his Lt elbow.  He was unable to get up for 45 min until his wife heard him and  it took three people to help him up.  PT modified session today to perform seated therex only. Pt declined walking today.  He is getting new imaging for thoracic spine (where fusion is) this Friday and has an appt with local neurosurgeon after MRI.  PT introduced gentle cervical AA/ROM SNAGS today  with good response.  OBJECTIVE IMPAIRMENTS: Abnormal gait, cardiopulmonary status limiting activity, decreased activity tolerance, decreased balance, decreased coordination, decreased endurance, decreased mobility, difficulty walking, decreased ROM, decreased strength, hypomobility, increased edema, impaired flexibility, impaired sensation, impaired UE functional use, improper body mechanics, postural dysfunction, and pain.   ACTIVITY LIMITATIONS: carrying, lifting, bending, sitting, standing, squatting, sleeping, stairs, transfers, bed mobility, bathing, toileting, dressing, hygiene/grooming, and locomotion level  PARTICIPATION LIMITATIONS: meal prep, cleaning, laundry, shopping, and community activity  PERSONAL FACTORS: Age, Time since onset of injury/illness/exacerbation, and 3+ comorbidities: a-fib, history of pathological fractures, fall risk, bone mets, neuropathy of bil LE are also affecting patient's functional outcome.   REHAB POTENTIAL: Good  CLINICAL DECISION MAKING: Evolving/moderate complexity  EVALUATION COMPLEXITY: Moderate   GOALS: Goals reviewed with patient? Yes  SHORT TERM GOALS: Target date: 09/06/23  Pt will be safe and ind with initial HEP Baseline: Goal status: MET 4/21  2.  Pt will be able to perform 5-10 reps of sit to stand without use of UE on thighs with controlled descent into chair Baseline:  Goal status: MET 08/31/23  3.  Pt will report reduced thoracic spine pain by at least 25% Baseline:  Goal status: MET 08/31/23  4.  Pt will improve TUG test to 16 sec or less using SPC Baseline: 18.28 Goal status: MET 5/7    LONG TERM GOALS: Target date: 10/04/23  Pt will demo safety and ind with HEP and understand the importance of ongoing compliance upon d/c from PT Baseline:  Goal status: INITIAL  2.  Pt will report reduced thoracic spine pain by at least 50%. Baseline:  Goal status: INITIAL  3.  Pt will cover at least 800' with [facility] in a to  improve endurance for community outings. Baseline: 20' in with SPC Goal status: ONGOING 5/7 Attempted with intermittent use of SPC - made it 4:15 covering 636'  4.  Pt will improve mobility to allow him to safely reach for object on floor using light UE support Baseline:  Goal status: INITIAL  5.  Pt will improve speed with 10 meter walk test to 11.5 sec Baseline: 12.62 Goal status: INITIAL  6.  Pt will improve 5X STS to 16 sec or less without use of UE on chair to demo improved functional strength and reduced fall risk. Baseline:  Goal status: MET 5/7  PLAN:  PT FREQUENCY: 2x/week  PT DURATION: 8 weeks  PLANNED INTERVENTIONS: 97110-Therapeutic exercises, 97530- Therapeutic activity, 97112- Neuromuscular re-education, 97535- Self Care, 54098- Manual therapy, [id number]- Gait training, Patient/Family education, Balance training, Stair training, Dry Needling, Joint mobilization, Spinal mobilization, Cryotherapy, and Moist heat.  PLAN FOR NEXT SESSION: Avoid supine secondary to back pain. Nustep, review HEP (PT added LE strength to already existing HEP from last episode of care), gait endurance, sit to stand, HS flexibility, functional body weight strength and trunk mobility - no heavy lifting due to bone mets  Raynell Caller, PT 09/19/23 11:45 AM   [facility] [address] [place], [place] [address] Phone # [phone] Fax [phone]

## 2023-09-20 ENCOUNTER — Other Ambulatory Visit: Payer: Self-pay

## 2023-09-20 ENCOUNTER — Encounter (HOSPITAL_COMMUNITY): Payer: Self-pay | Admitting: Emergency Medicine

## 2023-09-20 ENCOUNTER — Emergency Department (HOSPITAL_COMMUNITY)
Admission: EM | Admit: 2023-09-20 | Discharge: 2023-09-20 | Disposition: A | Discharge status: Home / Self Care | Attending: Emergency Medicine | Admitting: Emergency Medicine

## 2023-09-20 ENCOUNTER — Emergency Department (HOSPITAL_COMMUNITY)

## 2023-09-20 DIAGNOSIS — Z96641 Presence of right artificial hip joint: Secondary | ICD-10-CM | POA: Diagnosis not present

## 2023-09-20 DIAGNOSIS — M549 Dorsalgia, unspecified: Secondary | ICD-10-CM | POA: Diagnosis not present

## 2023-09-20 DIAGNOSIS — M25552 Pain in left hip: Secondary | ICD-10-CM | POA: Insufficient documentation

## 2023-09-20 DIAGNOSIS — R262 Difficulty in walking, not elsewhere classified: Secondary | ICD-10-CM | POA: Diagnosis not present

## 2023-09-20 DIAGNOSIS — M25551 Pain in right hip: Secondary | ICD-10-CM | POA: Insufficient documentation

## 2023-09-20 LAB — COMPREHENSIVE METABOLIC PANEL WITH GFR
ALT: 51 U/L — ABNORMAL HIGH (ref 0–44)
AST: 42 U/L — ABNORMAL HIGH (ref 15–41)
Albumin: 3.4 g/dL — ABNORMAL LOW (ref 3.5–5.0)
Alkaline Phosphatase: 75 U/L (ref 38–126)
Anion gap: 11 (ref 5–15)
BUN: 26 mg/dL — ABNORMAL HIGH (ref 8–23)
CO2: 24 mmol/L (ref 22–32)
Calcium: 8.9 mg/dL (ref 8.9–10.3)
Chloride: 102 mmol/L (ref 98–111)
Creatinine, Ser: 0.82 mg/dL (ref 0.61–1.24)
GFR, Estimated: >60 mL/min (ref >60)
Glucose, Bld: 100 mg/dL — ABNORMAL HIGH (ref 70–99)
Potassium: 3.2 mmol/L — ABNORMAL LOW (ref 3.5–5.1)
Sodium: 137 mmol/L (ref 135–145)
Total Bilirubin: 0.6 mg/dL (ref 0.0–1.2)
Total Protein: 6.1 g/dL — ABNORMAL LOW (ref 6.5–8.1)

## 2023-09-20 LAB — MAGNESIUM: Magnesium: 2.1 mg/dL (ref 1.7–2.4)

## 2023-09-20 LAB — CBC WITH DIFFERENTIAL/PLATELET
Abs Immature Granulocytes: 0.02 10*3/uL (ref 0.00–0.07)
Basophils Absolute: 0 10*3/uL (ref 0.0–0.1)
Basophils Relative: 1 %
Eosinophils Absolute: 0 10*3/uL (ref 0.0–0.5)
Eosinophils Relative: 1 %
HCT: 34 % — ABNORMAL LOW (ref 39.0–52.0)
Hemoglobin: 11.1 g/dL — ABNORMAL LOW (ref 13.0–17.0)
Immature Granulocytes: 0 %
Lymphocytes Relative: 12 %
Lymphs Abs: 0.7 10*3/uL (ref 0.7–4.0)
MCH: 31.6 pg (ref 26.0–34.0)
MCHC: 32.6 g/dL (ref 30.0–36.0)
MCV: 96.9 fL (ref 80.0–100.0)
Monocytes Absolute: 0.9 10*3/uL (ref 0.1–1.0)
Monocytes Relative: 15 %
Neutro Abs: 4.4 10*3/uL (ref 1.7–7.7)
Neutrophils Relative %: 71 %
Platelets: 122 10*3/uL — ABNORMAL LOW (ref 150–400)
RBC: 3.51 MIL/uL — ABNORMAL LOW (ref 4.22–5.81)
RDW: 18.1 % — ABNORMAL HIGH (ref 11.5–15.5)
WBC: 6.2 10*3/uL (ref 4.0–10.5)
nRBC: 0 % (ref 0.0–0.2)

## 2023-09-20 MED ORDER — MORPHINE SULFATE (PF) 4 MG/ML IV SOLN
6.0000 mg | Freq: Once | INTRAVENOUS | Status: AC
  Administered 2023-09-20: 6 mg via INTRAVENOUS
  Filled 2023-09-20: qty 2

## 2023-09-20 MED ORDER — MORPHINE SULFATE (PF) 4 MG/ML IV SOLN: 4.0000 mg | Freq: Once | INTRAVENOUS | Status: DC

## 2023-09-20 MED ORDER — ACETAMINOPHEN 500 MG PO TABS
1000.0000 mg | ORAL_TABLET | Freq: Once | ORAL | Status: AC
  Administered 2023-09-20: 1000 mg via ORAL
  Filled 2023-09-20: qty 2

## 2023-09-20 NOTE — ED Notes (Signed)
 Patient refused to use the urinal and wants to walk with a walker in the bathroom. RN tried to explain patient that he is HIGH FALL risk but refused to use the urinal. Consulting civil engineer made aware.

## 2023-09-20 NOTE — ED Notes (Signed)
 Patient to call [facility] at this time.

## 2023-09-20 NOTE — ED Triage Notes (Addendum)
 Patient BIB EMS from home c/o hip pain x 1 week. Patient report taking PRN pain medication at home without relief. Patient report fall yesterday afternoon. Patient report he fell slowly In the floor. Patient sustain abrasion on his left elbow. Patient denies LOC. Patient on blood thinners. Hx Multiple Myeloma.

## 2023-09-20 NOTE — ED Provider Notes (Signed)
 [place] EMERGENCY DEPARTMENT AT [facility] Provider Note   CSN: [id number] Arrival date & time: 09/20/23  2952     History Chief Complaint  Patient presents with   Hip Pain    HPI Travis [name] is a 73 y.o. male presenting for BL hip pain. States that over the past few nights he has had worsening BL hip pain. Tonight he could not sleep.  Denies fevers or chills/nausea or vomitting syncope or SOB No other systemic symptoms.  Patient's recorded medical, surgical, social, medication list and allergies were reviewed in the Snapshot window as part of the initial history.   Review of Systems   Review of Systems  Constitutional:  Negative for chills and fever.  HENT:  Negative for ear pain and sore throat.   Eyes:  Negative for pain and visual disturbance.  Respiratory:  Negative for cough and shortness of breath.   Cardiovascular:  Negative for chest pain and palpitations.  Gastrointestinal:  Negative for abdominal pain and vomiting.  Genitourinary:  Negative for dysuria and hematuria.  Musculoskeletal:  Negative for arthralgias and back pain.  Skin:  Negative for color change and rash.  Neurological:  Negative for seizures and syncope.  All other systems reviewed and are negative.   Physical Exam Updated Vital Signs BP (!) 164/86   Pulse 74   Temp 99.3 F (37.4 C) (Oral)   Resp 18   SpO2 97%  Physical Exam Vitals and nursing note reviewed.  Constitutional:      General: He is not in acute distress.    Appearance: He is well-developed.  HENT:     Head: Normocephalic and atraumatic.  Eyes:     Conjunctiva/sclera: Conjunctivae normal.  Cardiovascular:     Rate and Rhythm: Normal rate and regular rhythm.     Heart sounds: No murmur heard. Pulmonary:     Effort: Pulmonary effort is normal. No respiratory distress.     Breath sounds: Normal breath sounds.  Abdominal:     Palpations: Abdomen is soft.     Tenderness: There is no abdominal tenderness.   Musculoskeletal:        General: No swelling.     Cervical back: Neck supple.  Skin:    General: Skin is warm and dry.     Capillary Refill: Capillary refill takes less than 2 seconds.  Neurological:     Mental Status: He is alert.  Psychiatric:        Mood and Affect: Mood normal.      ED Course/ Medical Decision Making/ A&P    Procedures Procedures   Medications Ordered in ED Medications  acetaminophen  (TYLENOL ) tablet 1,000 mg (1,000 mg Oral Given 09/20/23 0349)  morphine  (PF) 4 MG/ML injection 6 mg (6 mg Intravenous Given 09/20/23 0350)    Medical Decision Making:   UZIAH SORTER is a 73 y.o. male who presented to the ED today with pulm detailed above.    Patient placed on continuous vitals and telemetry monitoring while in ED which was reviewed periodically.  Complete initial physical exam performed, notably the patient  was HDS in NAD.    Reviewed and confirmed nursing documentation for past medical history, family history, social history.    Initial Assessment:   With the patient's presentation of leg pain, most likely diagnosis is musculoskeletal etiology. Other diagnoses were considered including (but not limited to) complication mental myeloma, metabolic crisis. These are considered less likely due to history of present illness and physical  exam findings.   This is most consistent with an acute complicated illness  Initial Plan:  X-ray to evaluate for structural etiology Screening labs including CBC and Metabolic panel to evaluate for infectious or metabolic etiology of disease.  Urinalysis with reflex culture ordered to evaluate for UTI or relevant urologic/nephrologic pathology.   Initial Study Results:   Laboratory  All laboratory results reviewed without evidence of clinically relevant pathology.    Radiology:  All images reviewed independently. Agree with radiology report at this time.   DG Pelvis Portable Result Date: 09/20/2023 CLINICAL DATA:  Fall,  bilateral hip pain EXAM: PORTABLE PELVIS 1-2 VIEWS COMPARISON:  None Available. FINDINGS: Imaging is limited by soft tissue attenuation. 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 Normal alignment. No acute fracture or dislocation. Left hip joint space is preserved. Soft tissues are unremarkable. IMPRESSION: 1. Right total hip arthroplasty. No acute fracture or dislocation. Electronically Signed   By: Worthy Heads M.D.   On: 09/20/2023 03:54    Reassessment and Plan:   Patient's history of present illness and physical exam findings are most consistent with musculoskeletal etiology.  No focal pathology detected.  Patient's pain improved after IV medications.  Patient feels comfortable outpatient care management.   Disposition:  I have considered need for hospitalization, however, considering all of the above, I believe this patient is stable for discharge at this time.  Patient/family educated about specific return precautions for given chief complaint and symptoms.  Patient/family educated about follow-up with PC.     Patient/family expressed understanding of return precautions and need for follow-up. Patient spoken to regarding all imaging and laboratory results and appropriate follow up for these results. All education provided in verbal form with additional information in written form. Time was allowed for answering of patient questions. Patient discharged.    Emergency Department Medication Summary:   Medications  acetaminophen  (TYLENOL ) tablet 1,000 mg (1,000 mg Oral Given 09/20/23 0349)  morphine  (PF) 4 MG/ML injection 6 mg (6 mg Intravenous Given 09/20/23 0350)         Clinical Impression:  1. Acute pain of both hips      Discharge   Final Clinical Impression(s) / ED Diagnoses Final diagnoses:  Acute pain of both hips    Rx / DC Orders ED Discharge Orders     None         Onetha Bile, MD 09/20/23 [phone]

## 2023-09-20 NOTE — ED Notes (Signed)
 Patient d/c with home care instructions at this time. VS obtained. IV discontinued. Patient provided with walker to use the restroom and refused to get in wheelchair. Patient does want wheelchair to get to front lobby.

## 2023-09-21 ENCOUNTER — Ambulatory Visit: Admitting: Physical Therapy

## 2023-09-22 ENCOUNTER — Encounter (HOSPITAL_COMMUNITY): Payer: Self-pay

## 2023-09-22 ENCOUNTER — Emergency Department (HOSPITAL_COMMUNITY): Admission: EM | Admit: 2023-09-22 | Discharge: 2023-09-22 | Disposition: A | Discharge status: Home / Self Care

## 2023-09-22 ENCOUNTER — Emergency Department (HOSPITAL_COMMUNITY)

## 2023-09-22 DIAGNOSIS — M47816 Spondylosis without myelopathy or radiculopathy, lumbar region: Secondary | ICD-10-CM | POA: Diagnosis not present

## 2023-09-22 DIAGNOSIS — C9001 Multiple myeloma in remission: Secondary | ICD-10-CM | POA: Insufficient documentation

## 2023-09-22 DIAGNOSIS — C9000 Multiple myeloma not having achieved remission: Secondary | ICD-10-CM

## 2023-09-22 DIAGNOSIS — M5124 Other intervertebral disc displacement, thoracic region: Secondary | ICD-10-CM | POA: Diagnosis not present

## 2023-09-22 DIAGNOSIS — M4804 Spinal stenosis, thoracic region: Secondary | ICD-10-CM | POA: Diagnosis not present

## 2023-09-22 DIAGNOSIS — M47814 Spondylosis without myelopathy or radiculopathy, thoracic region: Secondary | ICD-10-CM | POA: Diagnosis not present

## 2023-09-22 DIAGNOSIS — M5416 Radiculopathy, lumbar region: Secondary | ICD-10-CM | POA: Diagnosis not present

## 2023-09-22 DIAGNOSIS — Z7901 Long term (current) use of anticoagulants: Secondary | ICD-10-CM | POA: Insufficient documentation

## 2023-09-22 DIAGNOSIS — M549 Dorsalgia, unspecified: Secondary | ICD-10-CM | POA: Diagnosis present

## 2023-09-22 LAB — BASIC METABOLIC PANEL WITH GFR
Anion gap: 11 (ref 5–15)
BUN: 30 mg/dL — ABNORMAL HIGH (ref 8–23)
CO2: 22 mmol/L (ref 22–32)
Calcium: 9.1 mg/dL (ref 8.9–10.3)
Chloride: 106 mmol/L (ref 98–111)
Creatinine, Ser: 0.87 mg/dL (ref 0.61–1.24)
GFR, Estimated: >60 mL/min (ref >60)
Glucose, Bld: 91 mg/dL (ref 70–99)
Potassium: 3.6 mmol/L (ref 3.5–5.1)
Sodium: 139 mmol/L (ref 135–145)

## 2023-09-22 LAB — CBC
HCT: 37.2 % — ABNORMAL LOW (ref 39.0–52.0)
Hemoglobin: 12 g/dL — ABNORMAL LOW (ref 13.0–17.0)
MCH: 32 pg (ref 26.0–34.0)
MCHC: 32.3 g/dL (ref 30.0–36.0)
MCV: 99.2 fL (ref 80.0–100.0)
Platelets: 146 10*3/uL — ABNORMAL LOW (ref 150–400)
RBC: 3.75 MIL/uL — ABNORMAL LOW (ref 4.22–5.81)
RDW: 18.6 % — ABNORMAL HIGH (ref 11.5–15.5)
WBC: 8.1 10*3/uL (ref 4.0–10.5)
nRBC: 0 % (ref 0.0–0.2)

## 2023-09-22 MED ORDER — ONDANSETRON HCL 4 MG/2ML IJ SOLN
4.0000 mg | Freq: Once | INTRAMUSCULAR | Status: AC
  Administered 2023-09-22: 4 mg via INTRAVENOUS
  Filled 2023-09-22: qty 2

## 2023-09-22 MED ORDER — DOCUSATE SODIUM 100 MG PO CAPS: 100.0000 mg | ORAL_CAPSULE | Freq: Two times a day (BID) | ORAL | 0 refills | Status: AC

## 2023-09-22 MED ORDER — OXYCODONE-ACETAMINOPHEN 5-325 MG PO TABS
2.0000 | ORAL_TABLET | Freq: Once | ORAL | Status: AC
  Administered 2023-09-22: 2 via ORAL
  Filled 2023-09-22: qty 2

## 2023-09-22 MED ORDER — HYDROMORPHONE HCL 1 MG/ML IJ SOLN
0.5000 mg | Freq: Once | INTRAMUSCULAR | Status: AC
  Administered 2023-09-22: 0.5 mg via INTRAVENOUS
  Filled 2023-09-22: qty 1

## 2023-09-22 MED ORDER — POLYETHYLENE GLYCOL 3350 17 G PO PACK: 17.0000 g | PACK | Freq: Every day | ORAL | 0 refills | Status: AC

## 2023-09-22 MED ORDER — MORPHINE SULFATE (PF) 4 MG/ML IV SOLN
4.0000 mg | Freq: Once | INTRAVENOUS | Status: AC
  Administered 2023-09-22: 4 mg via INTRAVENOUS
  Filled 2023-09-22: qty 1

## 2023-09-22 MED ORDER — OXYCODONE-ACETAMINOPHEN 10-325 MG PO TABS: 1.0000 | ORAL_TABLET | Freq: Four times a day (QID) | ORAL | 0 refills | Status: DC | PRN

## 2023-09-22 NOTE — ED Provider Notes (Addendum)
 [place] EMERGENCY DEPARTMENT AT [facility] Provider Note   CSN: [id number] Arrival date & time: 09/22/23  0865     History  Chief Complaint  Patient presents with   Leg Pain    Travis [name] is a 73 y.o. male.  73 year old male with past medical history of multiple myeloma presenting to the emergency department today with worsening back pain.  The patient was seen here few days ago for back pain.  Had x-ray at that time there was no concerning findings.  The patient was discharged and instructed to follow-up with his oncologist at [facility]  The patient has an appointment scheduled for tomorrow but the pain has been worsening despite taking his oxycodone  at home.  He denies any new bowel or bladder dysfunction or saddle anesthesia but is reporting some worsening numbness and difficulty ambulating due to the pain.  He came to the emergency department today for further evaluation regarding this.  Denies any significant back pain but when he has had issues with his legs in the past have been related to his back.  He states that the pain is worse with movement of his legs.  Denies any significant swelling of his legs.   Leg Pain      Home Medications Prior to Admission medications   Medication Sig Start Date End Date Taking? Authorizing Provider  docusate sodium  (COLACE) 100 MG capsule Take 1 capsule (100 mg total) by mouth every 12 (twelve) hours. 09/22/23  Yes Carin Charleston, MD  oxyCODONE -acetaminophen  (PERCOCET) 10-325 MG tablet Take 1 tablet by mouth every 6 (six) hours as needed for pain. 09/22/23  Yes Carin Charleston, MD  polyethylene glycol (MIRALAX ) 17 g packet Take 17 g by mouth daily. 09/22/23  Yes Carin Charleston, MD  acyclovir  (ZOVIRAX ) 400 MG tablet Take 1 tablet (400 mg total) by mouth 2 (two) times daily. 09/19/23   Ander Bame, MD  albuterol  (VENTOLIN  HFA) 108 [phone] Base) MCG/ACT inhaler Inhale 2 puffs into the lungs every 6 (six) hours as needed for wheezing or  shortness of breath. 10/03/22   Shalhoub, Merrill Abide, MD  amiodarone  (PACERONE ) 200 MG tablet Take 1 tablet (200 mg total) by mouth daily. 09/19/23   Elmyra Haggard, MD  calcium  carbonate (TUMS - DOSED IN MG ELEMENTAL CALCIUM ) 500 MG chewable tablet Chew 1 tablet by mouth daily.    [provider]  cholecalciferol  (VITAMIN D3) 25 MCG (1000 UNIT) tablet Take 1,000 Units by mouth daily.    [provider]  Cyanocobalamin  (VITAMIN B-12 PO) Take 1,000 mcg by mouth daily.    [provider]  ELIQUIS  5 MG TABS tablet TAKE ONE TABLET BY MOUTH TWICE A DAY 08/03/23   Elmyra Haggard, MD  fluticasone  (FLONASE ) 50 MCG/ACT nasal spray Place 1 spray into both nostrils daily as needed for allergies or rhinitis.    [provider]  gabapentin  (NEURONTIN ) 300 MG capsule TAKE TWO CAPSULES BY MOUTH TWICE A DAY 05/10/23   Patel, Donika K, DO  hydrALAZINE  (APRESOLINE ) 100 MG tablet Take 1 tablet (100 mg total) by mouth 3 (three) times daily. 05/09/23   Elmyra Haggard, MD  ketoconazole  (NIZORAL ) 2 % cream as needed for irritation. 10/23/22   [provider]  levETIRAcetam (KEPPRA) 500 MG tablet Take 500 mg by mouth 2 (two) times daily.    [provider]  metolazone  (ZAROXOLYN ) 2.5 MG tablet Take 1 tablet (2.5 mg total) by mouth every other  day. Patient taking differently: Take 2.5 mg by mouth 3 times/day as needed-between meals & bedtime (edema). Taking 1/2 ever other day or every three days 01/21/23   Elmyra Haggard, MD  polyethylene glycol powder (GLYCOLAX /MIRALAX ) 17 GM/SCOOP powder Take 1 g by mouth daily.    [provider]  potassium chloride  SA (KLOR-CON  M) 20 MEQ tablet TAKE TWO TABLETS BY MOUTH ONE TIME DAILY . THEN TAKE AN EXTRA TABLET ON THE DAY YOU TAKE THE METOLAZONE  09/15/23   Elmyra Haggard, MD  psyllium (METAMUCIL) 58.6 % powder Take by mouth at bedtime. 3 tsp daily hs    [provider]  sacubitril-valsartan (ENTRESTO ) 49-51 MG Take 1 tablet by mouth 2  (two) times daily.    [provider]  sodium chloride  (OCEAN) 0.65 % SOLN nasal spray Place 1 spray into both nostrils as needed for congestion.    [provider]  spironolactone  (ALDACTONE ) 25 MG tablet Take 1 tablet (25 mg total) by mouth daily. 01/27/23   Von Grumbling, PA-C  sulfamethoxazole-trimethoprim (BACTRIM DS) 800-160 MG tablet Take 1 tablet by mouth 3 (three) times a week.    [provider]  torsemide  (DEMADEX ) 20 MG tablet Take 2 tablets (40 mg total) by mouth daily. 12/14/22   Elmyra Haggard, MD      Allergies    Octagam [immune globulin ], Linezolid , and Vancomycin     Review of Systems   Review of Systems  Musculoskeletal:        Bilateral leg pain  All other systems reviewed and are negative.   Physical Exam Updated Vital Signs BP (!) 176/93   Pulse 70   Temp 97.9 F (36.6 C)   Resp 18   SpO2 96%  Physical Exam Vitals and nursing note reviewed.   Gen: Appears uncomfortable Eyes: PERRL, EOMI HEENT: no oropharyngeal swelling Neck: trachea midline Resp: clear to auscultation bilaterally Card: RRR, no murmurs, rubs, or gallops Abd: nontender, nondistended Extremities: no calf tenderness, no edema, the patient does have normal range of motion of all the joints on the bilateral lower extremities but does report pain with movement both actively and passively with positive straight leg raise MSK: No reproducible lumbar or thoracic spinal tenderness noted Neuro: The patient does seem to have equal strength sensation throughout the bilateral lower extremities Vascular: 2+ radial pulses bilaterally, 2+ DP pulses bilaterally Skin: no rashes Psyc: acting appropriately   ED Results / Procedures / Treatments   Labs (all labs ordered are listed, but only abnormal results are displayed) Labs Reviewed  CBC - Abnormal; Notable for the following components:      Result Value   RBC 3.75 (*)    Hemoglobin 12.0 (*)    HCT 37.2 (*)    RDW 18.6  (*)    Platelets 146 (*)    All other components within normal limits  BASIC METABOLIC PANEL WITH GFR - Abnormal; Notable for the following components:   BUN 30 (*)    All other components within normal limits    EKG None  Radiology MR THORACIC SPINE WO CONTRAST Result Date: 09/22/2023 CLINICAL DATA:  Mid back pain and lower back pain. Bilateral leg pain. Prior surgery of upper thoracic spine. EXAM: MRI THORACIC AND LUMBAR SPINE WITHOUT CONTRAST TECHNIQUE: Multiplanar and multiecho pulse sequences of the thoracic and lumbar spine were obtained without intravenous contrast. COMPARISON:  CT chest abdomen pelvis 03/22/2023. MRI thoracic and lumbar spine 10/23/2022. FINDINGS: MRI THORACIC SPINE FINDINGS Alignment: Exaggerated kyphosis  within the upper thoracic spine. No significant listhesis. Vertebrae: Posterior instrumented fusion spanning T1-T4 with bilateral pedicle screws at the T1, T3, and T4 levels. Sequelae of corpectomy at T2 with cage device noted at this level. Vertebral body heights are otherwise maintained. No bone marrow edema or evidence of fracture. Redemonstrated STIR hyperintense lesion within the T11 spinous process. Additional STIR hyperintense lesions within the T8 and T10 vertebra are less conspicuous. Cord:  Normal signal and morphology. Paraspinal and other soft tissues: The paraspinal soft tissues are unremarkable. Redemonstrated osseous lesion involving the posterior right sixth rib and posterior right eighth rib. Disc levels: No large disc herniation or high-grade spinal canal stenosis in the thoracic spine. Similar central disc protrusions at T7-8 and T8-9 which indent the ventral thecal sac without contacting the spinal cord. Facet arthrosis at multiple levels which indents the dorsal thecal sac. No high-grade foraminal stenosis. MRI LUMBAR SPINE FINDINGS Segmentation:  Standard. Alignment: Lumbar lordosis is maintained. Trace retrolisthesis of L5 on S1. Vertebrae: Subcentimeter  lesion in the L1 vertebra which is mildly STIR hyperintense again noted. Additional lesion involving the left pedicles of L1 and L2 again noted. Smaller lesions in the L3 through L5 vertebra again noted which are less conspicuous on the current noncontrast examination. Vertebral body heights are maintained. No evidence of fracture. There is likely a new subcentimeter lesion within the posterior left iliac bone. Conus medullaris and cauda equina: Conus extends to the L1-2 level. Conus and cauda equina appear normal. Paraspinal and other soft tissues: Paraspinal soft tissues are unremarkable. Disc levels: T12-L1: No significant disc bulge. No significant spinal canal stenosis or foraminal stenosis. L1-2: Minimal disc bulge. No significant spinal canal or foraminal stenosis. Mild facet arthrosis. L2-3: Disc bulge resulting in mild lateral recess narrowing without significant spinal canal stenosis or nerve root impingement. Mild facet arthrosis. No significant foraminal stenosis. L3-4: Diffuse disc bulge and central disc protrusion which indents the ventral thecal sac resulting in lateral recess narrowing with possible impingement upon the traversing nerve roots. Mild to moderate facet arthrosis indents the dorsal thecal sac. Disc protrusion appears overall decreased in size compared to the prior study. There is similar moderate spinal canal stenosis. Mild bilateral foraminal stenosis. L4-5: Diffuse disc bulge and posterior osteophytes which indent the ventral thecal sac with mild lateral recess narrowing without significant impingement upon the traversing nerve roots. Moderate facet arthrosis. No significant spinal canal stenosis. Mild bilateral foraminal stenosis. L5-S1: Diffuse disc bulge. Central disc protrusion indents the ventral thecal sac. Mild lateral recess narrowing without impingement upon the traversing nerve roots. Mild facet arthrosis. No significant spinal canal stenosis. Mild right and mild-to-moderate  left foraminal stenosis. IMPRESSION: MR THORACIC SPINE IMPRESSION Similar postsurgical changes of T2 corpectomy with posterior fusion spanning T1-T4. Similar retropulsion along the left aspect of T2 resulting in slight flattening of the ventral thoracic cord. Lesions in the T8 and T10 vertebra and T11 spinous process again noted. T6 lesion is not well visualized on the current noncontrast examination. No new lesions or pathologic fracture identified. MR LUMBAR SPINE IMPRESSION Redemonstrated osseous lesions within the lumbar spine. There is likely a new lesion within the posterior left iliac bone. No significant bone marrow edema or evidence pathologic fracture. Degenerative changes of the lumbar spine as above. Disc protrusion at L3-4 is slightly decreased in size from prior. Redemonstrated moderate spinal canal stenosis at L3-4. Electronically Signed   By: Denny Flack M.D.   On: 09/22/2023 13:59   MR LUMBAR SPINE WO CONTRAST  Result Date: 09/22/2023 CLINICAL DATA:  Mid back pain and lower back pain. Bilateral leg pain. Prior surgery of upper thoracic spine. EXAM: MRI THORACIC AND LUMBAR SPINE WITHOUT CONTRAST TECHNIQUE: Multiplanar and multiecho pulse sequences of the thoracic and lumbar spine were obtained without intravenous contrast. COMPARISON:  CT chest abdomen pelvis 03/22/2023. MRI thoracic and lumbar spine 10/23/2022. FINDINGS: MRI THORACIC SPINE FINDINGS Alignment: Exaggerated kyphosis within the upper thoracic spine. No significant listhesis. Vertebrae: Posterior instrumented fusion spanning T1-T4 with bilateral pedicle screws at the T1, T3, and T4 levels. Sequelae of corpectomy at T2 with cage device noted at this level. Vertebral body heights are otherwise maintained. No bone marrow edema or evidence of fracture. Redemonstrated STIR hyperintense lesion within the T11 spinous process. Additional STIR hyperintense lesions within the T8 and T10 vertebra are less conspicuous. Cord:  Normal signal and  morphology. Paraspinal and other soft tissues: The paraspinal soft tissues are unremarkable. Redemonstrated osseous lesion involving the posterior right sixth rib and posterior right eighth rib. Disc levels: No large disc herniation or high-grade spinal canal stenosis in the thoracic spine. Similar central disc protrusions at T7-8 and T8-9 which indent the ventral thecal sac without contacting the spinal cord. Facet arthrosis at multiple levels which indents the dorsal thecal sac. No high-grade foraminal stenosis. MRI LUMBAR SPINE FINDINGS Segmentation:  Standard. Alignment: Lumbar lordosis is maintained. Trace retrolisthesis of L5 on S1. Vertebrae: Subcentimeter lesion in the L1 vertebra which is mildly STIR hyperintense again noted. Additional lesion involving the left pedicles of L1 and L2 again noted. Smaller lesions in the L3 through L5 vertebra again noted which are less conspicuous on the current noncontrast examination. Vertebral body heights are maintained. No evidence of fracture. There is likely a new subcentimeter lesion within the posterior left iliac bone. Conus medullaris and cauda equina: Conus extends to the L1-2 level. Conus and cauda equina appear normal. Paraspinal and other soft tissues: Paraspinal soft tissues are unremarkable. Disc levels: T12-L1: No significant disc bulge. No significant spinal canal stenosis or foraminal stenosis. L1-2: Minimal disc bulge. No significant spinal canal or foraminal stenosis. Mild facet arthrosis. L2-3: Disc bulge resulting in mild lateral recess narrowing without significant spinal canal stenosis or nerve root impingement. Mild facet arthrosis. No significant foraminal stenosis. L3-4: Diffuse disc bulge and central disc protrusion which indents the ventral thecal sac resulting in lateral recess narrowing with possible impingement upon the traversing nerve roots. Mild to moderate facet arthrosis indents the dorsal thecal sac. Disc protrusion appears overall  decreased in size compared to the prior study. There is similar moderate spinal canal stenosis. Mild bilateral foraminal stenosis. L4-5: Diffuse disc bulge and posterior osteophytes which indent the ventral thecal sac with mild lateral recess narrowing without significant impingement upon the traversing nerve roots. Moderate facet arthrosis. No significant spinal canal stenosis. Mild bilateral foraminal stenosis. L5-S1: Diffuse disc bulge. Central disc protrusion indents the ventral thecal sac. Mild lateral recess narrowing without impingement upon the traversing nerve roots. Mild facet arthrosis. No significant spinal canal stenosis. Mild right and mild-to-moderate left foraminal stenosis. IMPRESSION: MR THORACIC SPINE IMPRESSION Similar postsurgical changes of T2 corpectomy with posterior fusion spanning T1-T4. Similar retropulsion along the left aspect of T2 resulting in slight flattening of the ventral thoracic cord. Lesions in the T8 and T10 vertebra and T11 spinous process again noted. T6 lesion is not well visualized on the current noncontrast examination. No new lesions or pathologic fracture identified. MR LUMBAR SPINE IMPRESSION Redemonstrated osseous lesions within the lumbar spine.  There is likely a new lesion within the posterior left iliac bone. No significant bone marrow edema or evidence pathologic fracture. Degenerative changes of the lumbar spine as above. Disc protrusion at L3-4 is slightly decreased in size from prior. Redemonstrated moderate spinal canal stenosis at L3-4. Electronically Signed   By: Denny Flack M.D.   On: 09/22/2023 13:59    Procedures Procedures    Medications Ordered in ED Medications  morphine  (PF) 4 MG/ML injection 4 mg (4 mg Intravenous Given 09/22/23 1038)  ondansetron  (ZOFRAN ) injection 4 mg (4 mg Intravenous Given 09/22/23 1037)  HYDROmorphone  (DILAUDID ) injection 0.5 mg (0.5 mg Intravenous Given 09/22/23 1241)    ED Course/ Medical Decision Making/ A&P                                  Medical Decision Making 73 year old male with past medical history of multiple myeloma who is on Eliquis  presenting to the emergency department today with worsening numbness and difficulty ambulating due to worsening bilateral lower extremity pain.  Will further evaluate him here with MRI of his thoracic and lumbar spine to evaluate for epidural hematoma or new cancerous lesion given the worsening symptoms and with his difficulty ambulating.  I will give patient morphine  and Zofran  for his symptoms.  He does have follow-up appointment scheduled tomorrow.  I think that if his imaging studies here are unremarkable and will get his pain under control he can potentially follow-up.  Obviously if he has any acute lesions that may require admission.  The patient's MRI scans did show multiple chronic lesions in his thoracic and lumbar spine but nothing acute.  There was a new lesion in the iliac bone there.  I did call and discussed this case with Dr. Leeta Puls.  She does have an appointment for the patient tomorrow scheduled.  Recommends trying to get in touch with neurosurgery here to help facilitate getting the patient in for possible steroid injections.  A call is placed to Dr. Cabbell.  Will discharge the patient with follow-up tomorrow with stronger pain medication.  Amount and/or Complexity of Data Reviewed Labs: ordered. Radiology: ordered.  Risk OTC drugs. Prescription drug management.           Final Clinical Impression(s) / ED Diagnoses Final diagnoses:  Lumbar radiculopathy  Multiple myeloma, remission status unspecified (HCC)    Rx / DC Orders ED Discharge Orders          Ordered    oxyCODONE -acetaminophen  (PERCOCET) 10-325 MG tablet  Every 6 hours PRN        09/22/23 1537    docusate sodium  (COLACE) 100 MG capsule  Every 12 hours        09/22/23 1537    polyethylene glycol (MIRALAX ) 17 g packet  Daily        09/22/23 1537               Carin Charleston, MD 09/22/23 1537    Carin Charleston, MD 09/22/23 [phone]

## 2023-09-22 NOTE — Discharge Instructions (Signed)
 I spoke with your oncologist.  They would like you to follow-up in their clinic tomorrow.  Please take the stronger Percocet as needed for pain.  If you are not taking the Colace please start this and take the MiraLAX  daily and if you do not have a bowel movement.  Please follow-up tomorrow.  Please follow-up with Dr. Charlestine Conquest office as well.  Return to the ER for worsening symptoms.

## 2023-09-22 NOTE — ED Triage Notes (Signed)
 Pt was here on Monday for bilateral leg pain, being seen at [facility] for the pain and has follow for it but the pain is so bad they could not make it to [facility]. Pt had rod in his neck and they believe that is where the pain is coming from. Pt states the pain is now in his thighs and going down his legs.

## 2023-09-22 NOTE — ED Notes (Signed)
 I have told the pt and the wife we can't get him up and walking because the pt stated his legs feel weak staff still got him up

## 2023-09-22 NOTE — ED Notes (Signed)
 Pt requesting pain meds. Dr. Charlee Conine notified

## 2023-09-22 NOTE — ED Notes (Signed)
 Pt back from MRI

## 2023-09-23 DIAGNOSIS — D472 Monoclonal gammopathy: Secondary | ICD-10-CM | POA: Diagnosis not present

## 2023-09-23 DIAGNOSIS — Z79899 Other long term (current) drug therapy: Secondary | ICD-10-CM | POA: Diagnosis not present

## 2023-09-23 DIAGNOSIS — Z92850 Personal history of chimeric antigen receptor t-cell therapy: Secondary | ICD-10-CM | POA: Diagnosis not present

## 2023-09-23 DIAGNOSIS — I10 Essential (primary) hypertension: Secondary | ICD-10-CM | POA: Diagnosis not present

## 2023-09-23 DIAGNOSIS — Z86718 Personal history of other venous thrombosis and embolism: Secondary | ICD-10-CM | POA: Diagnosis not present

## 2023-09-23 DIAGNOSIS — C9000 Multiple myeloma not having achieved remission: Secondary | ICD-10-CM | POA: Diagnosis not present

## 2023-09-23 DIAGNOSIS — R29898 Other symptoms and signs involving the musculoskeletal system: Secondary | ICD-10-CM | POA: Diagnosis not present

## 2023-09-23 DIAGNOSIS — Z7901 Long term (current) use of anticoagulants: Secondary | ICD-10-CM | POA: Diagnosis not present

## 2023-09-23 DIAGNOSIS — I4819 Other persistent atrial fibrillation: Secondary | ICD-10-CM | POA: Diagnosis not present

## 2023-09-24 DIAGNOSIS — M6281 Muscle weakness (generalized): Secondary | ICD-10-CM | POA: Diagnosis not present

## 2023-09-24 DIAGNOSIS — R2689 Other abnormalities of gait and mobility: Secondary | ICD-10-CM | POA: Diagnosis not present

## 2023-09-26 DIAGNOSIS — R2689 Other abnormalities of gait and mobility: Secondary | ICD-10-CM | POA: Diagnosis not present

## 2023-09-26 DIAGNOSIS — M6281 Muscle weakness (generalized): Secondary | ICD-10-CM | POA: Diagnosis not present

## 2023-09-27 ENCOUNTER — Emergency Department (HOSPITAL_COMMUNITY)

## 2023-09-27 ENCOUNTER — Encounter (HOSPITAL_COMMUNITY): Payer: Self-pay

## 2023-09-27 ENCOUNTER — Emergency Department (HOSPITAL_COMMUNITY)
Admission: EM | Admit: 2023-09-27 | Discharge: 2023-09-28 | Disposition: A | Discharge status: Home / Self Care | Attending: Student | Admitting: Student

## 2023-09-27 ENCOUNTER — Telehealth: Payer: Self-pay | Admitting: Internal Medicine

## 2023-09-27 ENCOUNTER — Other Ambulatory Visit: Payer: Self-pay

## 2023-09-27 ENCOUNTER — Ambulatory Visit

## 2023-09-27 DIAGNOSIS — R531 Weakness: Secondary | ICD-10-CM | POA: Diagnosis not present

## 2023-09-27 DIAGNOSIS — M7732 Calcaneal spur, left foot: Secondary | ICD-10-CM | POA: Diagnosis not present

## 2023-09-27 DIAGNOSIS — M19072 Primary osteoarthritis, left ankle and foot: Secondary | ICD-10-CM | POA: Diagnosis not present

## 2023-09-27 DIAGNOSIS — M25571 Pain in right ankle and joints of right foot: Secondary | ICD-10-CM | POA: Diagnosis not present

## 2023-09-27 DIAGNOSIS — C9000 Multiple myeloma not having achieved remission: Secondary | ICD-10-CM | POA: Diagnosis not present

## 2023-09-27 DIAGNOSIS — S0990XA Unspecified injury of head, initial encounter: Secondary | ICD-10-CM | POA: Insufficient documentation

## 2023-09-27 DIAGNOSIS — I10 Essential (primary) hypertension: Secondary | ICD-10-CM | POA: Diagnosis not present

## 2023-09-27 DIAGNOSIS — Z7901 Long term (current) use of anticoagulants: Secondary | ICD-10-CM | POA: Insufficient documentation

## 2023-09-27 DIAGNOSIS — K429 Umbilical hernia without obstruction or gangrene: Secondary | ICD-10-CM | POA: Diagnosis not present

## 2023-09-27 DIAGNOSIS — M25572 Pain in left ankle and joints of left foot: Secondary | ICD-10-CM | POA: Diagnosis not present

## 2023-09-27 DIAGNOSIS — M25551 Pain in right hip: Secondary | ICD-10-CM | POA: Diagnosis not present

## 2023-09-27 DIAGNOSIS — W19XXXA Unspecified fall, initial encounter: Secondary | ICD-10-CM | POA: Insufficient documentation

## 2023-09-27 DIAGNOSIS — S3991XA Unspecified injury of abdomen, initial encounter: Secondary | ICD-10-CM | POA: Diagnosis not present

## 2023-09-27 DIAGNOSIS — Z981 Arthrodesis status: Secondary | ICD-10-CM | POA: Diagnosis not present

## 2023-09-27 DIAGNOSIS — Z743 Need for continuous supervision: Secondary | ICD-10-CM | POA: Diagnosis not present

## 2023-09-27 DIAGNOSIS — I480 Paroxysmal atrial fibrillation: Secondary | ICD-10-CM | POA: Insufficient documentation

## 2023-09-27 DIAGNOSIS — N200 Calculus of kidney: Secondary | ICD-10-CM | POA: Diagnosis not present

## 2023-09-27 DIAGNOSIS — R9082 White matter disease, unspecified: Secondary | ICD-10-CM | POA: Diagnosis not present

## 2023-09-27 DIAGNOSIS — M25579 Pain in unspecified ankle and joints of unspecified foot: Secondary | ICD-10-CM | POA: Diagnosis not present

## 2023-09-27 DIAGNOSIS — R296 Repeated falls: Secondary | ICD-10-CM | POA: Diagnosis not present

## 2023-09-27 DIAGNOSIS — S299XXA Unspecified injury of thorax, initial encounter: Secondary | ICD-10-CM | POA: Insufficient documentation

## 2023-09-27 DIAGNOSIS — I700 Atherosclerosis of aorta: Secondary | ICD-10-CM | POA: Diagnosis not present

## 2023-09-27 DIAGNOSIS — M4802 Spinal stenosis, cervical region: Secondary | ICD-10-CM | POA: Diagnosis not present

## 2023-09-27 DIAGNOSIS — Z79899 Other long term (current) drug therapy: Secondary | ICD-10-CM | POA: Insufficient documentation

## 2023-09-27 DIAGNOSIS — I517 Cardiomegaly: Secondary | ICD-10-CM | POA: Diagnosis not present

## 2023-09-27 DIAGNOSIS — Z96641 Presence of right artificial hip joint: Secondary | ICD-10-CM | POA: Diagnosis not present

## 2023-09-27 DIAGNOSIS — M47816 Spondylosis without myelopathy or radiculopathy, lumbar region: Secondary | ICD-10-CM | POA: Diagnosis not present

## 2023-09-27 DIAGNOSIS — S99811A Other specified injuries of right ankle, initial encounter: Secondary | ICD-10-CM | POA: Diagnosis not present

## 2023-09-27 LAB — CBC WITH DIFFERENTIAL/PLATELET
Abs Immature Granulocytes: 0.15 10*3/uL — ABNORMAL HIGH (ref 0.00–0.07)
Basophils Absolute: 0.1 10*3/uL (ref 0.0–0.1)
Basophils Relative: 1 %
Eosinophils Absolute: 0 10*3/uL (ref 0.0–0.5)
Eosinophils Relative: 0 %
HCT: 36.9 % — ABNORMAL LOW (ref 39.0–52.0)
Hemoglobin: 12 g/dL — ABNORMAL LOW (ref 13.0–17.0)
Immature Granulocytes: 2 %
Lymphocytes Relative: 6 %
Lymphs Abs: 0.6 10*3/uL — ABNORMAL LOW (ref 0.7–4.0)
MCH: 32.3 pg (ref 26.0–34.0)
MCHC: 32.5 g/dL (ref 30.0–36.0)
MCV: 99.5 fL (ref 80.0–100.0)
Monocytes Absolute: 1.3 10*3/uL — ABNORMAL HIGH (ref 0.1–1.0)
Monocytes Relative: 13 %
Neutro Abs: 8 10*3/uL — ABNORMAL HIGH (ref 1.7–7.7)
Neutrophils Relative %: 78 %
Platelets: 189 10*3/uL (ref 150–400)
RBC: 3.71 MIL/uL — ABNORMAL LOW (ref 4.22–5.81)
RDW: 18.4 % — ABNORMAL HIGH (ref 11.5–15.5)
WBC: 10.2 10*3/uL (ref 4.0–10.5)
nRBC: 0.7 % — ABNORMAL HIGH (ref 0.0–0.2)

## 2023-09-27 LAB — COMPREHENSIVE METABOLIC PANEL WITH GFR
ALT: 38 U/L (ref 0–44)
AST: 32 U/L (ref 15–41)
Albumin: 3.5 g/dL (ref 3.5–5.0)
Alkaline Phosphatase: 87 U/L (ref 38–126)
Anion gap: 11 (ref 5–15)
BUN: 23 mg/dL (ref 8–23)
CO2: 23 mmol/L (ref 22–32)
Calcium: 8.9 mg/dL (ref 8.9–10.3)
Chloride: 105 mmol/L (ref 98–111)
Creatinine, Ser: 0.81 mg/dL (ref 0.61–1.24)
GFR, Estimated: >60 mL/min (ref >60)
Glucose, Bld: 105 mg/dL — ABNORMAL HIGH (ref 70–99)
Potassium: 3.7 mmol/L (ref 3.5–5.1)
Sodium: 139 mmol/L (ref 135–145)
Total Bilirubin: 0.4 mg/dL (ref 0.0–1.2)
Total Protein: 6.3 g/dL — ABNORMAL LOW (ref 6.5–8.1)

## 2023-09-27 LAB — URINALYSIS, ROUTINE W REFLEX MICROSCOPIC
Bilirubin Urine: NEGATIVE
Glucose, UA: NEGATIVE mg/dL
Hgb urine dipstick: NEGATIVE
Ketones, ur: NEGATIVE mg/dL
Leukocytes,Ua: NEGATIVE
Nitrite: NEGATIVE
Protein, ur: NEGATIVE mg/dL
Specific Gravity, Urine: 1.013 (ref 1.005–1.030)
pH: 6 (ref 5.0–8.0)

## 2023-09-27 LAB — I-STAT CG4 LACTIC ACID, ED: Lactic Acid, Venous: 1.3 mmol/L (ref 0.5–1.9)

## 2023-09-27 LAB — TROPONIN I (HIGH SENSITIVITY): Troponin I (High Sensitivity): 16 ng/L (ref <18)

## 2023-09-27 MED ORDER — OXYCODONE HCL 5 MG PO TABS
5.0000 mg | ORAL_TABLET | Freq: Four times a day (QID) | ORAL | Status: DC | PRN
  Administered 2023-09-27 – 2023-09-28 (×2): 5 mg via ORAL
  Filled 2023-09-27 (×2): qty 1

## 2023-09-27 MED ORDER — SULFAMETHOXAZOLE-TRIMETHOPRIM 800-160 MG PO TABS
1.0000 | ORAL_TABLET | ORAL | Status: DC
Start: 2023-09-28 — End: 2023-09-28
  Administered 2023-09-28: 1 via ORAL
  Filled 2023-09-27: qty 1

## 2023-09-27 MED ORDER — CALCIUM CARBONATE ANTACID 500 MG PO CHEW
1.0000 | CHEWABLE_TABLET | Freq: Every day | ORAL | Status: DC
  Administered 2023-09-27 – 2023-09-28 (×2): 200 mg via ORAL
  Filled 2023-09-27 (×2): qty 1

## 2023-09-27 MED ORDER — DOCUSATE SODIUM 100 MG PO CAPS: 100.0000 mg | ORAL_CAPSULE | Freq: Two times a day (BID) | ORAL | Status: DC

## 2023-09-27 MED ORDER — TORSEMIDE 20 MG PO TABS
40.0000 mg | ORAL_TABLET | Freq: Every day | ORAL | Status: DC
Start: 2023-09-27 — End: 2023-09-28
  Administered 2023-09-27: 40 mg via ORAL
  Filled 2023-09-27 (×2): qty 2

## 2023-09-27 MED ORDER — ACYCLOVIR 400 MG PO TABS
400.0000 mg | ORAL_TABLET | Freq: Two times a day (BID) | ORAL | Status: DC
Start: 2023-09-27 — End: 2023-09-28
  Administered 2023-09-27 – 2023-09-28 (×3): 400 mg via ORAL
  Filled 2023-09-27 (×3): qty 1

## 2023-09-27 MED ORDER — APIXABAN 5 MG PO TABS
5.0000 mg | ORAL_TABLET | Freq: Two times a day (BID) | ORAL | Status: DC
  Administered 2023-09-27 – 2023-09-28 (×3): 5 mg via ORAL
  Filled 2023-09-27: qty 2
  Filled 2023-09-27 (×2): qty 1

## 2023-09-27 MED ORDER — ALBUTEROL SULFATE HFA 108 (90 BASE) MCG/ACT IN AERS: 2.0000 | INHALATION_SPRAY | Freq: Four times a day (QID) | RESPIRATORY_TRACT | Status: DC | PRN

## 2023-09-27 MED ORDER — SULFAMETHOXAZOLE-TRIMETHOPRIM 800-160 MG PO TABS: 1.0000 | ORAL_TABLET | ORAL | Status: DC

## 2023-09-27 MED ORDER — POLYETHYLENE GLYCOL 3350 17 G PO PACK
17.0000 g | PACK | Freq: Every day | ORAL | Status: DC
  Administered 2023-09-27 – 2023-09-28 (×2): 17 g via ORAL
  Filled 2023-09-27 (×2): qty 1

## 2023-09-27 MED ORDER — SACUBITRIL-VALSARTAN 49-51 MG PO TABS
1.0000 | ORAL_TABLET | Freq: Two times a day (BID) | ORAL | Status: DC
  Administered 2023-09-27 – 2023-09-28 (×3): 1 via ORAL
  Filled 2023-09-27 (×3): qty 1

## 2023-09-27 MED ORDER — VITAMIN D 25 MCG (1000 UNIT) PO TABS
1000.0000 [IU] | ORAL_TABLET | Freq: Every day | ORAL | Status: DC
  Administered 2023-09-27 – 2023-09-28 (×2): 1000 [IU] via ORAL
  Filled 2023-09-27 (×2): qty 1

## 2023-09-27 MED ORDER — HYDROCODONE-ACETAMINOPHEN 5-325 MG PO TABS
2.0000 | ORAL_TABLET | Freq: Once | ORAL | Status: AC
  Administered 2023-09-27: 2 via ORAL
  Filled 2023-09-27: qty 2

## 2023-09-27 MED ORDER — OXYCODONE-ACETAMINOPHEN 10-325 MG PO TABS: 1.0000 | ORAL_TABLET | Freq: Four times a day (QID) | ORAL | Status: DC | PRN

## 2023-09-27 MED ORDER — AMIODARONE HCL 200 MG PO TABS
200.0000 mg | ORAL_TABLET | Freq: Every day | ORAL | Status: DC
  Administered 2023-09-27 – 2023-09-28 (×2): 200 mg via ORAL
  Filled 2023-09-27 (×2): qty 1

## 2023-09-27 MED ORDER — VITAMIN B-12 1000 MCG PO TABS
1000.0000 ug | ORAL_TABLET | Freq: Every day | ORAL | Status: DC
  Administered 2023-09-27 – 2023-09-28 (×2): 1000 ug via ORAL
  Filled 2023-09-27 (×2): qty 1

## 2023-09-27 MED ORDER — OXYCODONE-ACETAMINOPHEN 5-325 MG PO TABS
1.0000 | ORAL_TABLET | Freq: Four times a day (QID) | ORAL | Status: DC | PRN
  Administered 2023-09-27: 1 via ORAL
  Filled 2023-09-27: qty 1

## 2023-09-27 MED ORDER — LEVETIRACETAM 500 MG PO TABS
500.0000 mg | ORAL_TABLET | Freq: Two times a day (BID) | ORAL | Status: DC
  Administered 2023-09-27 – 2023-09-28 (×2): 500 mg via ORAL
  Filled 2023-09-27 (×3): qty 1

## 2023-09-27 MED ORDER — SPIRONOLACTONE 25 MG PO TABS
25.0000 mg | ORAL_TABLET | Freq: Every day | ORAL | Status: DC
  Administered 2023-09-27 – 2023-09-28 (×2): 25 mg via ORAL
  Filled 2023-09-27 (×2): qty 1

## 2023-09-27 MED ORDER — HYDRALAZINE HCL 50 MG PO TABS
100.0000 mg | ORAL_TABLET | Freq: Three times a day (TID) | ORAL | Status: DC
Start: 2023-09-27 — End: 2023-09-28
  Administered 2023-09-27 – 2023-09-28 (×4): 100 mg via ORAL
  Filled 2023-09-27 (×5): qty 2

## 2023-09-27 MED ORDER — IOHEXOL 300 MG/ML  SOLN
100.0000 mL | Freq: Once | INTRAMUSCULAR | Status: AC | PRN
  Administered 2023-09-27: 100 mL via INTRAVENOUS

## 2023-09-27 MED ORDER — DOCUSATE SODIUM 100 MG PO CAPS
100.0000 mg | ORAL_CAPSULE | Freq: Two times a day (BID) | ORAL | Status: DC
  Administered 2023-09-27 – 2023-09-28 (×3): 100 mg via ORAL
  Filled 2023-09-27 (×3): qty 1

## 2023-09-27 MED ORDER — METOLAZONE 2.5 MG PO TABS: 2.5000 mg | ORAL_TABLET | Freq: Two times a day (BID) | ORAL | Status: DC | PRN

## 2023-09-27 MED ORDER — ALBUTEROL SULFATE (2.5 MG/3ML) 0.083% IN NEBU: 2.5000 mg | INHALATION_SOLUTION | Freq: Four times a day (QID) | RESPIRATORY_TRACT | Status: DC | PRN

## 2023-09-27 MED ORDER — GABAPENTIN 300 MG PO CAPS
600.0000 mg | ORAL_CAPSULE | Freq: Two times a day (BID) | ORAL | Status: DC
  Administered 2023-09-27 – 2023-09-28 (×3): 600 mg via ORAL
  Filled 2023-09-27 (×3): qty 2

## 2023-09-27 MED ORDER — POTASSIUM CHLORIDE CRYS ER 20 MEQ PO TBCR
20.0000 meq | EXTENDED_RELEASE_TABLET | Freq: Every day | ORAL | Status: DC
  Administered 2023-09-27 – 2023-09-28 (×2): 20 meq via ORAL
  Filled 2023-09-27 (×2): qty 1

## 2023-09-27 NOTE — Telephone Encounter (Signed)
 Pt c/o swelling: STAT is pt has developed SOB within 24 hours  How much weight have you gained and in what time span? unsure  If swelling, where is the swelling located?   Are you currently taking a fluid pill? Yes, torsemide  (DEMADEX ) tablet 40 mg 40 mg, Oral, Daily   Are you currently SOB? unsure  Do you have a log of your daily weights (if so, list)?  Have you gained 3 pounds in a day or 5 pounds in a week? Unsure  Have you traveled recently? No   Patient's wife is concerned about the patient building fluid as a side effect to the current Car-T therapy the patient is having. Patient's wife stated the patient is currently at the hospital where they took 13lbs of fluid off last night. Patient's wife stated the hospital would like to release the patient, but patient's wife is concerned. Patient's wife is requesting a call back to go over options on how to care for the patient. Please advise.

## 2023-09-27 NOTE — ED Notes (Signed)
Pt. Given turkey sandwich and drink 

## 2023-09-27 NOTE — Progress Notes (Signed)
 CSW spoke with pt's wife to inform of SNF recommendation. She will review [email or web address] and contact this Clinical research associate back with preferred facility. Patient to be faxed out.

## 2023-09-27 NOTE — ED Triage Notes (Signed)
 Pt BIBA from home with c/o bilateral weakness x2 weeks, Pt dx with cancer in 2018 that has gotten worse. Has fallen 7x this week from weakness. No LOC, has not hit his head. Pt takes Eliquis . Having frequent urge to urinate but only a small amount is coming out. No other UTI present.   150/84- HR 82-RR 16- 98% RA- CBG 147

## 2023-09-27 NOTE — ED Notes (Signed)
 Patient informed RN that he is no longer taking Keppra. PRN pain medication given for left ankle pain. MD made aware about patient's need for a CPAP.

## 2023-09-27 NOTE — Progress Notes (Signed)
   09/27/23 2258  BiPAP/CPAP/SIPAP  $ Non-Invasive Home Ventilator  Initial  $ Face Mask Large  Yes  BiPAP/CPAP/SIPAP Pt Type Adult  BiPAP/CPAP/SIPAP DREAMSTATIOND  Mask Type Full face mask  Dentures removed? Not applicable  Mask Size Large  Respiratory Rate 18 breaths/min  EPAP 11 cmH2O  Patient Home Machine No  Patient Home Mask No  Patient Home Tubing No  Auto Titrate No  Device Plugged into RED Power Outlet Yes

## 2023-09-27 NOTE — ED Provider Notes (Signed)
 [place] EMERGENCY DEPARTMENT AT [facility] Provider Note  CSN: [id number] Arrival date & time: 09/27/23 4098  Chief Complaint(s) Weakness and Fall  HPI Travis MCCRYSTAL is a 73 y.o. male with PMH multiple myeloma on CAR-T therapy following at [facility], paroxysmal A-fib on Eliquis , DVT, HTN who presents emergency room for evaluation of generalized weakness and frequent falls.  States that over the last 1 week she has had 7 different falls but today the fall was the worst.  States that he stood up to use the bathroom and his legs gave out from under him.  States that left leg folded backwards and he is having pain at the ankle on the left and at the hip on the right.  Denies head strike but is on blood thinners.  Denies loss of consciousness.   Past Medical History Past Medical History:  Diagnosis Date   A-fib [facility])    Arthritis    "comes and goes; mostly in hands, occasionally elbow" (04/05/2017)   Complication of anesthesia    "just wanted to sleep alot  for couple days S/P VARICOCELE OR" (04/05/2017)   DVT (deep venous thrombosis) (HCC)    LLE   Dysrhythmia    PVCs    GERD (gastroesophageal reflux disease)    History of hiatal hernia    History of radiation therapy    Thoracic Spine- 07/13/22-07/23/22-Dr. Retta Caster   Hypertension    Impaired glucose tolerance    Multiple myeloma (HCC)    Obesity (BMI 30-39.9) 07/26/2016   OSA on CPAP 07/26/2016   Mild with AHI 12.5/hr now on CPAP at 14cm H2O   PAF (paroxysmal atrial fibrillation) (HCC)    s/p PVI Ablation in 2018 // Flecainide  Rx // Echo 8/21: EF 60-65, no RWMA, mild LVH, normal RV SF, moderate RVE, mild BAE, trivial MR, mild dilation of aortic root (40 mm)   Pneumonia    "may have had walking pneumonia a few years ago" (04/05/2017)   Pre-diabetes    Thrombophlebitis    Patient Active Problem List   Diagnosis Date Noted   Pancreatic cyst 09/30/2022   Infection due to novel influenza A virus 09/29/2022    Persistent atrial fibrillation (HCC) 07/15/2022   Acute on chronic diastolic heart failure (HCC) 07/03/2022   Pulmonary infiltrates 07/01/2022   Acute respiratory failure with hypoxia (HCC) 07/01/2022   Hyponatremia 06/29/2022   History of thoracic surgery 06/29/2022   Acute DVT of left tibial vein (HCC) 06/21/2022   Hypokalemia 06/21/2022   Vitamin D  deficiency 05/30/2022   Other constipation 05/29/2022   Pathologic compression fracture of spine, initial encounter (HCC) 05/29/2022   Cord compression (HCC) 05/28/2022   Degenerative cervical disc 05/14/2022   Hypercoagulable state due to persistent atrial fibrillation (HCC) 01/11/2022   Multiple myeloma (HCC) 12/10/2021   Medication monitoring encounter 02/27/2021   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   Status post peripherally inserted central catheter (PICC) central line placement 11/06/2020   Drug rash with eosinophilia and systemic symptoms syndrome 11/06/2020   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   PAF (paroxysmal atrial fibrillation) (HCC)    GERD (gastroesophageal reflux disease) 03/29/2018   Hiatal hernia 03/29/2018   History of DVT (deep vein thrombosis) 03/29/2018   CAP (community acquired pneumonia) 03/29/2018   Osteoarthritis 03/29/2018   Neuropathy 08/29/2017   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 Authorizing Provider  acyclovir  (ZOVIRAX ) 400 MG tablet Take 1 tablet (400 mg total) by mouth 2 (two) times daily. 09/19/23   Ander Bame, MD   albuterol  (VENTOLIN  HFA) 108 [id number] Base) MCG/ACT inhaler Inhale 2 puffs into the lungs every 6 (six) hours as needed for wheezing or shortness of breath. 10/03/22   Shalhoub, Merrill Abide, MD  amiodarone  (PACERONE ) 200 MG tablet Take 1 tablet (200 mg total) by mouth daily. 09/19/23   Elmyra Haggard, MD  calcium  carbonate (TUMS - DOSED IN MG ELEMENTAL CALCIUM ) 500 MG chewable tablet Chew 1 tablet by mouth daily.    [provider]  cholecalciferol  (VITAMIN D3) 25 MCG (1000 UNIT) tablet Take 1,000 Units by mouth daily.    [provider]  Cyanocobalamin  (VITAMIN B-12 PO) Take 1,000 mcg by mouth daily.    [provider]  docusate sodium  (COLACE) 100 MG capsule Take 1 capsule (100 mg total) by mouth every 12 (twelve) hours. 09/22/23   Carin Charleston, MD  ELIQUIS  5 MG TABS tablet TAKE ONE TABLET BY MOUTH TWICE A DAY 08/03/23   Elmyra Haggard, MD  fluticasone  (FLONASE ) 50 MCG/ACT nasal spray Place 1 spray into both nostrils daily as needed for allergies or rhinitis.    [provider]  gabapentin  (NEURONTIN ) 300 MG capsule TAKE TWO CAPSULES BY MOUTH TWICE A DAY 05/10/23   Patel, Donika K, DO  hydrALAZINE  (APRESOLINE ) 100 MG tablet Take 1 tablet (100 mg total) by mouth 3 (three) times daily. 05/09/23   Elmyra Haggard, MD  ketoconazole  (NIZORAL ) 2 % cream as needed for irritation. 10/23/22   [provider]  levETIRAcetam (KEPPRA) 500 MG tablet Take 500 mg by mouth 2 (two) times daily.    [provider]  metolazone  (ZAROXOLYN ) 2.5 MG tablet Take 1 tablet (2.5 mg total) by mouth every other day. Patient taking differently: Take 2.5 mg by mouth 3 times/day as needed-between meals & bedtime (edema). Taking 1/2 ever other day or every three days 01/21/23   Elmyra Haggard, MD  oxyCODONE -acetaminophen  (PERCOCET) 10-325 MG tablet Take 1 tablet by mouth every 6 (six) hours as needed for pain. 09/22/23   Carin Charleston, MD  polyethylene glycol (MIRALAX ) 17 g packet Take 17 g by mouth  daily. 09/22/23   Carin Charleston, MD  polyethylene glycol powder (GLYCOLAX /MIRALAX ) 17 GM/SCOOP powder Take 1 g by mouth daily.    [provider]  potassium chloride  SA (KLOR-CON  M) 20 MEQ tablet TAKE TWO TABLETS BY MOUTH ONE TIME DAILY . THEN TAKE AN EXTRA TABLET ON THE DAY YOU TAKE THE METOLAZONE  09/15/23   Elmyra Haggard, MD  psyllium (METAMUCIL) 58.6 % powder Take by mouth at bedtime. 3 tsp daily hs    [provider]  sacubitril-valsartan (ENTRESTO ) 49-51 MG Take 1 tablet by mouth 2 (two) times daily.    [provider]  sodium chloride  (OCEAN) 0.65 % SOLN nasal spray Place 1 spray into both nostrils as needed for congestion.    [provider]  spironolactone  (ALDACTONE ) 25 MG tablet Take 1 tablet (25 mg total) by mouth daily. 01/27/23   Von Grumbling, PA-C  sulfamethoxazole-trimethoprim (BACTRIM DS) 800-160 MG tablet Take 1 tablet by mouth 3 (three) times a week.    [provider]  torsemide  (DEMADEX ) 20 MG tablet Take 2 tablets (40 mg total) by mouth daily. 12/14/22   Elmyra Haggard, MD                                                                                                                                    Past Surgical History Past Surgical History:  Procedure Laterality Date   ATRIAL FIBRILLATION ABLATION  04/05/2017   ATRIAL FIBRILLATION ABLATION N/A 04/05/2017   Procedure: ATRIAL FIBRILLATION ABLATION;  Surgeon: Jolly Needle, MD;  Location: [facility];  Service: Cardiovascular;  Laterality: N/A;   BONE BIOPSY Right 12/17/2021   Procedure: BONE BIOPSY;  Surgeon: Micheline Ahr, MD;  Location: [facility];  Service: Orthopedics;  Laterality: Right;   COMPLETE RIGHT HIP REPLACMENT  01/19/2019   EVALUATION UNDER ANESTHESIA WITH HEMORRHOIDECTOMY N/A 02/24/2017   Procedure: EXAM UNDER ANESTHESIA WITH  HEMORRHOIDECTOMY;  Surgeon: Candyce Champagne, MD;  Location: [facility];  Service: General;  Laterality: N/A;   EXCISIONAL TOTAL  HIP ARTHROPLASTY WITH ANTIBIOTIC SPACERS Right 09/25/2020   Procedure: EXCISIONAL TOTAL HIP ARTHROPLASTY WITH ANTIBIOTIC SPACERS;  Surgeon: Claiborne Crew, MD;  Location: [facility];  Service: Orthopedics;  Laterality: Right;   FLEXIBLE SIGMOIDOSCOPY N/A 04/15/2015   Procedure:  UNSEDATED FLEXIBLE SIGMOIDOSCOPY;  Surgeon: Garrett Kallman, MD;  Location: [facility];  Service: Endoscopy;  Laterality: N/A;   HUMERUS IM NAIL Right 12/17/2021   Procedure: INTRAMEDULLARY (IM) NAIL HUMERAL;  Surgeon: Micheline Ahr, MD;  Location: [facility];  Service: Orthopedics;  Laterality: Right;   KNEE ARTHROSCOPY Left ~ 2016   POSTERIOR CERVICAL FUSION/FORAMINOTOMY N/A 06/04/2022   Procedure: Thoracic one - Thoracic Four Posterior lateral arthrodesis/ fusion and Thoracic two Corpectomy;  Surgeon: Audie Bleacher, MD;  Location: [facility];  Service: Neurosurgery;  Laterality: N/A;   REIMPLANTATION OF TOTAL HIP Right 12/25/2020   Procedure: REIMPLANTATION/REVISION OF RIGHT TOTAL HIP VERSUS REPEAT IRRIGATION AND DEBRIDEMENT;  Surgeon: Claiborne Crew, MD;  Location: [facility];  Service: Orthopedics;  Laterality: Right;   TESTICLE SURGERY  1988   VARICOCELE   TONSILLECTOMY     VARICOSE VEIN SURGERY Bilateral    Family History Family History  Problem Relation Age of Onset   Dementia Mother    Stroke Father    Atrial fibrillation Father    Hypertension Father    Hypertension Paternal Grandfather    Dementia Maternal Grandmother     Social History Social History   Tobacco Use   Smoking status: Never   Smokeless tobacco: Never  Vaping Use   Vaping status: Never Used  Substance Use Topics   Alcohol use: Yes    Alcohol/week: 2.0 standard drinks of alcohol    Types: 2 Glasses of wine per week    Comment: 2  glasses of wine a week, occ bourbon, beer   Drug use: No   Allergies Octagam [immune globulin ], Linezolid , and Vancomycin   Review of Systems Review of Systems  Constitutional:  Positive for fatigue.   Musculoskeletal:  Positive for arthralgias and myalgias.    Physical Exam Vital Signs  I have reviewed the triage vital signs BP (!) 153/78   Pulse 65   Resp 15   SpO2 98%   Physical Exam Constitutional:      General: He is not in acute distress.    Appearance: Normal appearance.  HENT:     Head: Normocephalic and atraumatic.     Nose: No congestion or rhinorrhea.  Eyes:     General:        Right eye: No discharge.        Left eye: No discharge.     Extraocular Movements: Extraocular movements intact.     Pupils: Pupils are equal, round, and reactive to light.  Cardiovascular:     Rate and Rhythm: Normal rate and regular rhythm.     Heart sounds: No murmur heard. Pulmonary:     Effort: No respiratory distress.     Breath sounds: No wheezing or rales.  Abdominal:     General: There is no distension.     Tenderness: There is no abdominal tenderness.  Musculoskeletal:        General: Tenderness present. Normal range of motion.     Cervical back: Normal range of motion.  Skin:    General: Skin is warm and dry.  Neurological:     General: No focal deficit present.     Mental Status: He is alert.     Cranial Nerves: No cranial nerve deficit.     Sensory: No sensory deficit.     Motor: No weakness.     ED Results and Treatments Labs (all labs ordered are listed, but only abnormal results are displayed) Labs Reviewed  COMPREHENSIVE METABOLIC PANEL WITH GFR - Abnormal; Notable for the following components:      Result Value   Glucose, Bld 105 (*)    Total Protein 6.3 (*)    All other components within normal limits  CBC WITH DIFFERENTIAL/PLATELET - Abnormal; Notable for the following components:   RBC 3.71 (*)    Hemoglobin 12.0 (*)    HCT 36.9 (*)    RDW 18.4 (*)    nRBC 0.7 (*)    Neutro Abs 8.0 (*)    Lymphs Abs 0.6 (*)    Monocytes Absolute 1.3 (*)    Abs Immature Granulocytes 0.15 (*)    All other components within normal limits  URINALYSIS, ROUTINE W  REFLEX MICROSCOPIC - Abnormal; Notable for the following components:   Color, Urine STRAW (*)    All other components within normal limits  I-STAT CG4 LACTIC ACID, ED  I-STAT CG4 LACTIC ACID, ED  TROPONIN I (HIGH SENSITIVITY)  Radiology CT CERVICAL SPINE WO CONTRAST Result Date: 09/27/2023 CLINICAL DATA:  73 year old male with weakness. Multiple myeloma. Multiple falls. On Eliquis . History of T2 corpectomy and T1 through T4 posterior fusion. EXAM: CT CERVICAL SPINE WITHOUT CONTRAST TECHNIQUE: Multidetector CT imaging of the cervical spine was performed without intravenous contrast. Multiplanar CT image reconstructions were also generated. RADIATION DOSE REDUCTION: This exam was performed according to the departmental dose-optimization program which includes automated exposure control, adjustment of the mA and/or kV according to patient size and/or use of iterative reconstruction technique. COMPARISON:  Head CT today. Recent thoracic spine MRI 09/22/2023. Cervical spine MRI 10/23/2022. FINDINGS: Alignment: Stable straightening of cervical lordosis from last year. Normal cervicothoracic junction alignment. Maintained cervical posterior element alignment. Skull base and vertebrae: Visualized skull base is intact. No atlanto-occipital dissociation. C1 and C2 appear intact and aligned. No cervical vertebral fracture or significant lytic lesion identified by CT. Soft tissues and spinal canal: No prevertebral fluid or swelling. No visible canal hematoma. Negative visible noncontrast neck soft tissues. Disc levels: Cervical spine degeneration superimposed on degenerative appearing C3-C4 facet ankylosis. Evidence of developing C2-C3 degenerative facet ankylosis. Developing C4-C5 interbody ankylosis. Solid-appearing C5-C6 posterior interbody ankylosis. Cervical spinal stenosis which was mild by MRI  last year C4-C5 through C6-C7 appeared stable on the recent 09/22/2023 MRI. Upper chest: Chronic T2 corpectomy, chronic posterior T1 through T4 spinal fusion hardware. Posterior decompression T2 and T3. No change suspected when compared to the recent 09/22/2023 MRI. Tortuous proximal great vessels and mild apical lung scarring also noted. IMPRESSION: 1. No acute traumatic injury identified in the Cervical spine. No significant cervical vertebral lytic lesion. 2. Chronic T2 corpectomy, T2 and T3 posterior decompression, T1 through T4 posterior fusion. Electronically Signed   By: Marlise Simpers M.D.   On: 09/27/2023 06:06   CT CHEST ABDOMEN PELVIS W CONTRAST Result Date: 09/27/2023 CLINICAL DATA:  Multiple falls.  Worsening multiple myeloma. EXAM: CT CHEST, ABDOMEN, AND PELVIS WITH CONTRAST TECHNIQUE: Multidetector CT imaging of the chest, abdomen and pelvis was performed following the standard protocol during bolus administration of intravenous contrast. RADIATION DOSE REDUCTION: This exam was performed according to the departmental dose-optimization program which includes automated exposure control, adjustment of the mA and/or kV according to patient size and/or use of iterative reconstruction technique. CONTRAST:  OMNIPAQUE  IOHEXOL  300 MG/ML  SOLN COMPARISON:  03/22/2023 FINDINGS: CT CHEST FINDINGS Cardiovascular: The heart size is upper normal / borderline enlarged. No substantial pericardial effusion. No thoracic aortic aneurysm. No substantial atherosclerosis of the thoracic aorta. Enlargement of the pulmonary outflow tract/main pulmonary arteries suggests pulmonary arterial hypertension. Mediastinum/Nodes: No mediastinal lymphadenopathy. There is no hilar lymphadenopathy. The esophagus has normal imaging features. There is no axillary lymphadenopathy. Lungs/Pleura: Dependent atelectasis is noted in the lung bases. No suspicious pulmonary nodule or mass. No focal airspace consolidation. No pleural effusion. No  pneumothorax. Musculoskeletal: Fixation hardware is noted in the right humerus and upper thoracic spine. Scattered lucent and mixed density bone lesions are evident including in the right acromion, multiple ribs, and thoracic spine compatible with the reported history of multiple myeloma. Pathologic fracture posterolateral left seventh rib evident. Healed fracture posterolateral right ninth rib. CT ABDOMEN PELVIS FINDINGS Hepatobiliary: Cyst in the inferior right liver are similar to prior. No followup imaging is recommended. There is no evidence for gallstones, gallbladder wall thickening, or pericholecystic fluid. No intrahepatic or extrahepatic biliary dilation. Pancreas: No focal mass lesion. No dilatation of the main duct. Low-density pancreatic tail lesion measured previously at 3.0  x 2.5 cm is 3.2 x 2.3 cm today, similar in the interval. Spleen: No splenomegaly. No suspicious focal mass lesion. Adrenals/Urinary Tract: No adrenal nodule or mass. Several punctate nonobstructing stones are seen in the right kidney. 4 mm nonobstructing stone identified interpolar left kidney. Tiny well-defined homogeneous low-density lesions in both kidneys are too small to characterize but are statistically most likely benign and probably cysts. No followup imaging is recommended. No evidence for hydroureter. The urinary bladder appears normal for the degree of distention. Stomach/Bowel: Stomach is unremarkable. No gastric wall thickening. No evidence of outlet obstruction. Duodenum is normally positioned as is the ligament of Treitz. No small bowel wall thickening. No small bowel dilatation. The terminal ileum is normal. The appendix is normal. No gross colonic mass. No colonic wall thickening. Diverticuli are seen scattered along the entire length of the colon without CT findings of diverticulitis. Vascular/Lymphatic: There is mild atherosclerotic calcification of the abdominal aorta without aneurysm. There is no gastrohepatic  or hepatoduodenal ligament lymphadenopathy. No retroperitoneal or mesenteric lymphadenopathy. No pelvic sidewall lymphadenopathy. Reproductive: The prostate gland and seminal vesicles are unremarkable. Other: No intraperitoneal free fluid. Musculoskeletal: Small umbilical hernia contains only fat. Bilateral groin hernias contain only fat. Status post right hip replacement. Scattered lucent lesions in the lumbar spine and bony pelvis are similar to prior compatible with the reported history of multiple myeloma. The index 14 mm lesion in the roof of the left acetabulum was 14 mm previously (remeasured). IMPRESSION: 1. No evidence for acute posttraumatic injury in the chest, abdomen, or pelvis. 2. Scattered lucent and mixed density bone lesions in the ribs, thoracic spine, lumbar spine, and bony pelvis are similar to prior compatible with the reported history of multiple myeloma. Probable pathologic fracture posterolateral left seventh rib. 3. Enlargement of the pulmonary outflow tract/main pulmonary arteries suggests pulmonary arterial hypertension. 4. Bilateral nonobstructing renal stones. 5. Colonic diverticulosis without diverticulitis. 6. Small umbilical and bilateral groin hernias contain only fat. 7. Low-density pancreatic tail lesions again identified. Dominant lesion measured previously at 3.0 x 2.5 cm is 3.2 x 2.3 cm today, similar in the interval. MRI abdomen with and without contrast may prove helpful to further evaluate as clinically warranted. 8.  Aortic Atherosclerosis (ICD10-I70.0). Electronically Signed   By: Donnal Fusi M.D.   On: 09/27/2023 05:59   CT HEAD WO CONTRAST ( ) Result Date: 09/27/2023 CLINICAL DATA:  73 year old male with weakness. Multiple myeloma. Multiple falls. On Eliquis . EXAM: CT HEAD WITHOUT CONTRAST TECHNIQUE: Contiguous axial images were obtained from the base of the skull through the vertex without intravenous contrast. RADIATION DOSE REDUCTION: This exam was performed  according to the departmental dose-optimization program which includes automated exposure control, adjustment of the mA and/or kV according to patient size and/or use of iterative reconstruction technique. COMPARISON:  Head CT 10/11/2016.  Brain MRI 10/07/2022. FINDINGS: Brain: Cerebral volume stable from the MRI last year. No midline shift, ventriculomegaly, mass effect, evidence of mass lesion, intracranial hemorrhage or evidence of cortically based acute infarction. Patchy and confluent bilateral cerebral white matter similar to T2/FLAIR signal changes last year. Vascular: Calcified atherosclerosis at the skull base. No suspicious intracranial vascular hyperdensity. Skull: No fracture identified. Oval lucent/lytic lesion at the left vertex is 14 mm on series 6, image 49, new since 2018. Similar new 15-16 mm right occiput bone lesion series 6, image 31. Smaller lucent areas elsewhere. Sinuses/Orbits: Visualized paranasal sinuses and mastoids are stable and well aerated. Other: Postoperative changes to both globes since 2018. No  acute orbit or scalp soft tissue injury identified. IMPRESSION: 1. Progression of skull Multiple Myeloma since 2018. No pathologic skull fracture identified. 2. No acute intracranial abnormality. Stable chronic cerebral white matter disease. Electronically Signed   By: Marlise Simpers M.D.   On: 09/27/2023 05:59   DG Pelvis Portable Result Date: 09/27/2023 CLINICAL DATA:  Fall, rule out fracture, right-sided groin pain, lateral ankle pain, shortness of breath, history of multiple myeloma EXAM: PORTABLE PELVIS 1-2 VIEWS COMPARISON:  None Available. FINDINGS: Right THA. No evidence of loosening. No acute fracture or dislocation. Degenerative changes pubic symphysis, left hip, SI joints and lower lumbar spine. IMPRESSION: No acute fracture or dislocation. Electronically Signed   By: Rozell Cornet M.D.   On: 09/27/2023 04:03   DG Ankle Complete Left Result Date: 09/27/2023 CLINICAL DATA:  Fall,  rule out fracture, right-sided groin pain, lateral ankle pain, shortness of breath, history of multiple myeloma EXAM: LEFT ANKLE COMPLETE - 3+ VIEW COMPARISON:  None Available. FINDINGS: There is no evidence of fracture, dislocation, or joint effusion. Degenerative arthritis about the midfoot. Calcaneal spurs. Vascular calcifications. IMPRESSION: No acute fracture or dislocation. Electronically Signed   By: Rozell Cornet M.D.   On: 09/27/2023 04:02   DG Chest Portable 1 View Result Date: 09/27/2023 CLINICAL DATA:  Fall, rule out fracture, right-sided groin pain, lateral ankle pain, shortness of breath, history of multiple myeloma EXAM: PORTABLE CHEST 1 VIEW COMPARISON:  Chest radiograph 05/25/2023 FINDINGS: Stable cardiomegaly. No focal consolidation, pleural effusion, or pneumothorax. No displaced rib fractures. Unchanged thickening in the posterior right sixth rib likely due to remote fracture. Posterior fusion cervicothoracic spine. IMPRESSION: 1. No acute cardiopulmonary disease. 2. No displaced rib fractures. Electronically Signed   By: Rozell Cornet M.D.   On: 09/27/2023 04:01    Pertinent labs & imaging results that were available during my care of the patient were reviewed by me and considered in my medical decision making (see MDM for details).  Medications Ordered in ED Medications  acyclovir  (ZOVIRAX ) tablet 400 mg (has no administration in time range)  albuterol  (VENTOLIN  HFA) 108 (90 Base) MCG/ACT inhaler 2 puff (has no administration in time range)  amiodarone  (PACERONE ) tablet 200 mg (has no administration in time range)  calcium  carbonate (TUMS - dosed in mg elemental calcium ) chewable tablet 200 mg of elemental calcium  (has no administration in time range)  cholecalciferol  (VITAMIN D3) 25 MCG (1000 UNIT) tablet 1,000 Units (has no administration in time range)  cyanocobalamin  (VITAMIN B12) tablet 1,000 mcg (has no administration in time range)  docusate sodium  (COLACE) capsule  100 mg (has no administration in time range)  apixaban  (ELIQUIS ) tablet 5 mg (has no administration in time range)  gabapentin  (NEURONTIN ) capsule 600 mg (has no administration in time range)  hydrALAZINE  (APRESOLINE ) tablet 100 mg (has no administration in time range)  levETIRAcetam (KEPPRA) tablet 500 mg (has no administration in time range)  metolazone  (ZAROXOLYN ) tablet 2.5 mg (has no administration in time range)  oxyCODONE -acetaminophen  (PERCOCET) 10-325 MG per tablet 1 tablet (has no administration in time range)  polyethylene glycol (MIRALAX  / GLYCOLAX ) packet 17 g (has no administration in time range)  sacubitril-valsartan (ENTRESTO ) 49-51 mg per tablet (has no administration in time range)  spironolactone  (ALDACTONE ) tablet 25 mg (has no administration in time range)  torsemide  (DEMADEX ) tablet 40 mg (has no administration in time range)  sulfamethoxazole-trimethoprim (BACTRIM DS) 800-160 MG per tablet 1 tablet (has no administration in time range)  HYDROcodone -acetaminophen  (NORCO/VICODIN) 5-325 MG per  tablet 2 tablet (2 tablets Oral Given 09/27/23 0358)  iohexol  (OMNIPAQUE ) 300 MG/ML solution 100 mL (100 mLs Intravenous Contrast Given 09/27/23 0521)                                                                                                                                     Procedures Procedures  (including critical care time)  Medical Decision Making / ED Course   This patient presents to the ED for concern of generalized weakness, frequent falls, this involves an extensive number of treatment options, and is a complaint that carries with it a high risk of complications and morbidity.  The differential diagnosis includes electrolyte abnormality, dehydration, anemia, myeloma progression, deconditioning  MDM: Patient seen emergency room for evaluation of generalized fatigue and frequent falls.  Physical exam with tenderness at the ankle but trauma exam otherwise unremarkable.   Laboratory evaluation with a hemoglobin of 12 which is patient's baseline and is otherwise unremarkable.  Urinalysis without evidence of infection.  Trauma imaging including chest x-ray, pelvis x-ray, ankle x-ray, CT head, C-spine, chest abdomen pelvis negative for acute traumatic injury.  Showing stable sclerotic disease.  Patient does not feel comfortable with available resources at home and feels that his wife is overwhelmed and suffering from caretaker fatigue.  Will order PT and TOC consult.  Pending these recommendations at time of signout.  Please see provider signout note for continuation of workup.   Additional history obtained:  -External records from outside source obtained and reviewed including: Chart review including previous notes, labs, imaging, consultation notes   Lab Tests: -I ordered, reviewed, and interpreted labs.   The pertinent results include:   Labs Reviewed  COMPREHENSIVE METABOLIC PANEL WITH GFR - Abnormal; Notable for the following components:      Result Value   Glucose, Bld 105 (*)    Total Protein 6.3 (*)    All other components within normal limits  CBC WITH DIFFERENTIAL/PLATELET - Abnormal; Notable for the following components:   RBC 3.71 (*)    Hemoglobin 12.0 (*)    HCT 36.9 (*)    RDW 18.4 (*)    nRBC 0.7 (*)    Neutro Abs 8.0 (*)    Lymphs Abs 0.6 (*)    Monocytes Absolute 1.3 (*)    Abs Immature Granulocytes 0.15 (*)    All other components within normal limits  URINALYSIS, ROUTINE W REFLEX MICROSCOPIC - Abnormal; Notable for the following components:   Color, Urine STRAW (*)    All other components within normal limits  I-STAT CG4 LACTIC ACID, ED  I-STAT CG4 LACTIC ACID, ED  TROPONIN I (HIGH SENSITIVITY)       Imaging Studies ordered: I ordered imaging studies including CT head, C-spine, chest abdomen pelvis, x-ray ankle I independently visualized and interpreted imaging. I agree with the radiologist interpretation   Medicines  ordered and prescription drug management: Meds ordered this encounter  Medications   HYDROcodone -acetaminophen  (NORCO/VICODIN) 5-325 MG per tablet 2 tablet    Refill:  0   iohexol  (OMNIPAQUE ) 300 MG/ML solution 100 mL   acyclovir  (ZOVIRAX ) tablet 400 mg   albuterol  (VENTOLIN  HFA) 108 (90 Base) MCG/ACT inhaler 2 puff   amiodarone  (PACERONE ) tablet 200 mg   calcium  carbonate (TUMS - dosed in mg elemental calcium ) chewable tablet 200 mg of elemental calcium    cholecalciferol  (VITAMIN D3) 25 MCG (1000 UNIT) tablet 1,000 Units   cyanocobalamin  (VITAMIN B12) tablet 1,000 mcg   docusate sodium  (COLACE) capsule 100 mg   apixaban  (ELIQUIS ) tablet 5 mg   gabapentin  (NEURONTIN ) capsule 600 mg   hydrALAZINE  (APRESOLINE ) tablet 100 mg   levETIRAcetam  (KEPPRA ) tablet 500 mg   metolazone  (ZAROXOLYN ) tablet 2.5 mg   oxyCODONE -acetaminophen  (PERCOCET) 10-325 MG per tablet 1 tablet    Refill:  0   polyethylene glycol (MIRALAX  / GLYCOLAX ) packet 17 g   sacubitril -valsartan  (ENTRESTO ) 49-51 mg per tablet    ACE-inhibitors have NOT been administered in the past 36-hours.:   YES (confirmed by ordering provider)   spironolactone  (ALDACTONE ) tablet 25 mg   torsemide  (DEMADEX ) tablet 40 mg   sulfamethoxazole -trimethoprim  (BACTRIM  DS) 800-160 MG per tablet 1 tablet    -I have reviewed the patients home medicines and have made adjustments as needed  Critical interventions none   Cardiac Monitoring: The patient was maintained on a cardiac monitor.  I personally viewed and interpreted the cardiac monitored which showed an underlying rhythm of: NSR  Social Determinants of Health:  Factors impacting patients care include: Frequent falls, increasing needs at home, wife is primary caretaker experiencing caretaker fatigue   Reevaluation: After the interventions noted above, I reevaluated the patient and found that they have :stayed the same  Co morbidities that complicate the patient evaluation  Past  Medical History:  Diagnosis Date   A-fib [facility])    Arthritis    "comes and goes; mostly in hands, occasionally elbow" (04/05/2017)   Complication of anesthesia    "just wanted to sleep alot  for couple days S/P VARICOCELE OR" (04/05/2017)   DVT (deep venous thrombosis) (HCC)    LLE   Dysrhythmia    PVCs    GERD (gastroesophageal reflux disease)    History of hiatal hernia    History of radiation therapy    Thoracic Spine- 07/13/22-07/23/22-Dr. Retta Caster   Hypertension    Impaired glucose tolerance    Multiple myeloma (HCC)    Obesity (BMI 30-39.9) 07/26/2016   OSA on CPAP 07/26/2016   Mild with AHI 12.5/hr now on CPAP at 14cm H2O   PAF (paroxysmal atrial fibrillation) (HCC)    s/p PVI Ablation in 2018 // Flecainide  Rx // Echo 8/21: EF 60-65, no RWMA, mild LVH, normal RV SF, moderate RVE, mild BAE, trivial MR, mild dilation of aortic root (40 mm)   Pneumonia    "may have had walking pneumonia a few years ago" (04/05/2017)   Pre-diabetes    Thrombophlebitis       Dispostion: I considered admission for this patient, and disposition pending TOC evaluation.  Please see provider signout note for continuation of workup     Final Clinical Impression(s) / ED Diagnoses Final diagnoses:  None     @PCDICTATION @    Karlyn Overman, MD 09/27/23 [id number]

## 2023-09-27 NOTE — Telephone Encounter (Signed)
 Patient's wife Trevor Fudge states patient fell last night trying to get up to go to the bathroom. He was prescribed steroids by Dr. Leeta Puls at [facility] for side effects of Car-T therapy. Trevor Fudge reports patient has lost 13 lbs of fluid overnight prior to be taken to [facility] via EMS.  Trevor Fudge reports [facility] is planning to send patient home, but Trevor Fudge is worried about this stating she is exhausted and unable to help patient get up and down to the bathroom throughout the night. She is worried about him having a heart attack at home. She states she is speaking with Child psychotherapist at [facility] and discussing options including short-term rehab. She states there is an issue with insurance paying and needs MD (Dr. Leeta Puls, Dr. Rosaline Coma or Dr. Avanell Bob) to state he needs to either stay in hospital for monitoring or go to rehab.  Trevor Fudge expressed frustration and asked what is she supposed to do for patient from a cardiac standpoint. She does not feel comfortable managing him at home at this time.  Micki Alas to speak with [facility] staff and ask for rounding cardiologist to see patient and make recommendations. Dr. Avanell Bob is off today, but will forward message to her.  Trevor Fudge states she will request for rounding cardiologist to see patient and ended call.

## 2023-09-27 NOTE — Progress Notes (Addendum)
 Pt is reviewing facilities. Faxed out. CSW to follow up with pt to inquire about preferred facilities.   Addend @ 1:07PM CSW presented bed offers. Awaiting selection from pt.  Addend @ 2:15PM Patient chose [facility]. Bed confirmed with [facility]. CSW to initiate auth.

## 2023-09-27 NOTE — NC FL2 (Signed)
 [place]  MEDICAID FL2 LEVEL OF CARE FORM     IDENTIFICATION  Patient Name: Travis [name] Birthdate: [birth year] Sex: male Admission Date (Current Location): 09/27/2023  [facility] and [place] Number:  Producer, television/film/video and Address:  [facility],  [address] [place], [place] [address]      Provider Number: [id number]  Attending Physician Name and Address:  System, Provider Not In  Relative Name and Phone Number:  Emari, Demmer (Spouse)  [phone] (Mobile)    Current Level of Care: Hospital Recommended Level of Care: Skilled Nursing Facility Prior Approval Number:    Date Approved/Denied:   PASRR Number: [id number]  Discharge Plan: SNF    Current Diagnoses: Patient Active Problem List   Diagnosis Date Noted   Pancreatic cyst 09/30/2022   Infection due to novel influenza A virus 09/29/2022   Persistent atrial fibrillation (HCC) 07/15/2022   Acute on chronic diastolic heart failure (HCC) 07/03/2022   Pulmonary infiltrates 07/01/2022   Acute respiratory failure with hypoxia (HCC) 07/01/2022   Hyponatremia 06/29/2022   History of thoracic surgery 06/29/2022   Acute DVT of left tibial vein (HCC) 06/21/2022   Hypokalemia 06/21/2022   Vitamin D  deficiency 05/30/2022   Other constipation 05/29/2022   Pathologic compression fracture of spine, initial encounter (HCC) 05/29/2022   Cord compression (HCC) 05/28/2022   Degenerative cervical disc 05/14/2022   Hypercoagulable state due to persistent atrial fibrillation (HCC) 01/11/2022   Multiple myeloma (HCC) 12/10/2021   Medication monitoring encounter 02/27/2021   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   Status post peripherally inserted central catheter (PICC) central line placement 11/06/2020   Drug rash with eosinophilia and systemic symptoms syndrome 11/06/2020   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   PAF (paroxysmal atrial fibrillation) (HCC)    GERD (gastroesophageal reflux  disease) 03/29/2018   Hiatal hernia 03/29/2018   History of DVT (deep vein thrombosis) 03/29/2018   CAP (community acquired pneumonia) 03/29/2018   Osteoarthritis 03/29/2018   Neuropathy 08/29/2017   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   Paroxysmal atrial fibrillation with rapid ventricular response (HCC) 04/05/2017   Prolapsed internal hemorrhoids, grade 4, s/p ligation/pexy/hemorrhoidectomy x 2 02/24/2017 02/24/2017   OSA (obstructive sleep apnea) 07/26/2016   Obesity (BMI 30-39.9) 07/26/2016   Atrial fibrillation with RVR (HCC)    Essential hypertension    Chest pain at rest     Orientation RESPIRATION BLADDER Height & Weight     Self, Time, Situation, Place  Normal Continent Weight:   Height:     BEHAVIORAL SYMPTOMS/MOOD NEUROLOGICAL BOWEL NUTRITION STATUS      Continent Diet (Regular)  AMBULATORY STATUS COMMUNICATION OF NEEDS Skin   Extensive Assist Verbally Normal                       Personal Care Assistance Level of Assistance  Bathing, Feeding, Dressing Bathing Assistance: Maximum assistance Feeding assistance: Limited assistance Dressing Assistance: Maximum assistance     Functional Limitations Info  Sight, Hearing, Speech Sight Info: Adequate Hearing Info: Adequate Speech Info: Adequate    SPECIAL CARE FACTORS FREQUENCY  PT (By licensed PT), OT (By licensed OT)     PT Frequency: x5/week OT Frequency: x5/week            Contractures Contractures Info: Not present    Additional Factors Info  Code Status, Allergies Code Status Info: Full Allergies Info: Octagam (Immune Globulin )  Linezolid   Vancomycin   Current Medications (09/27/2023):  This is the current hospital active medication list Current Facility-Administered Medications  Medication Dose Route Frequency Provider Last Rate Last Admin   acyclovir  (ZOVIRAX ) tablet 400 mg  400 mg Oral BID Kommor, Madison, MD   400 mg at 09/27/23 1017   albuterol  (PROVENTIL ) (2.5 MG/3ML)  0.083% nebulizer solution 2.5 mg  2.5 mg Nebulization Q6H PRN Rubie Corona, RPH       amiodarone  (PACERONE ) tablet 200 mg  200 mg Oral Daily Kommor, Madison, MD   200 mg at 09/27/23 1016   apixaban  (ELIQUIS ) tablet 5 mg  5 mg Oral BID Kommor, Madison, MD   5 mg at 09/27/23 0981   calcium  carbonate (TUMS - dosed in mg elemental calcium ) chewable tablet 200 mg of elemental calcium   1 tablet Oral Daily Kommor, Madison, MD   200 mg of elemental calcium  at 09/27/23 0920   cholecalciferol  (VITAMIN D3) 25 MCG (1000 UNIT) tablet 1,000 Units  1,000 Units Oral Daily Kommor, Madison, MD   1,000 Units at 09/27/23 0920   cyanocobalamin  (VITAMIN B12) tablet 1,000 mcg  1,000 mcg Oral Daily Kommor, Madison, MD   1,000 mcg at 09/27/23 0921   docusate sodium  (COLACE) capsule 100 mg  100 mg Oral BID Rubie Corona, RPH   100 mg at 09/27/23 1914   gabapentin  (NEURONTIN ) capsule 600 mg  600 mg Oral BID Kommor, Madison, MD   600 mg at 09/27/23 7829   hydrALAZINE  (APRESOLINE ) tablet 100 mg  100 mg Oral TID Kommor, Alyse July, MD   100 mg at 09/27/23 1016   levETIRAcetam (KEPPRA) tablet 500 mg  500 mg Oral BID Kommor, Madison, MD       oxyCODONE -acetaminophen  (PERCOCET/ROXICET) 5-325 MG per tablet 1 tablet  1 tablet Oral Q6H PRN Rubie Corona, RPH       And   oxyCODONE  (Oxy IR/ROXICODONE ) immediate release tablet 5 mg  5 mg Oral Q6H PRN Arlyne Bering T, RPH       polyethylene glycol (MIRALAX  / GLYCOLAX ) packet 17 g  17 g Oral Daily Kommor, Madison, MD   17 g at 09/27/23 0920   sacubitril-valsartan (ENTRESTO ) 49-51 mg per tablet  1 tablet Oral BID Kommor, Madison, MD   1 tablet at 09/27/23 1017   spironolactone  (ALDACTONE ) tablet 25 mg  25 mg Oral Daily Kommor, Madison, MD   25 mg at 09/27/23 0921   [START ON 09/28/2023] sulfamethoxazole-trimethoprim (BACTRIM DS) 800-160 MG per tablet 1 tablet  1 tablet Oral Once per day on Monday Wednesday Friday Kommor, Alyse July, MD       torsemide  (DEMADEX ) tablet 40 mg  40 mg Oral  Daily Kommor, Madison, MD   40 mg at 09/27/23 5621   Current Outpatient Medications  Medication Sig Dispense Refill   acyclovir  (ZOVIRAX ) 400 MG tablet Take 1 tablet (400 mg total) by mouth 2 (two) times daily. 60 tablet 5   albuterol  (VENTOLIN  HFA) 108 (90 Base) MCG/ACT inhaler Inhale 2 puffs into the lungs every 6 (six) hours as needed for wheezing or shortness of breath. 8 g 0   amiodarone  (PACERONE ) 200 MG tablet Take 1 tablet (200 mg total) by mouth daily. 90 tablet 2   calcium  carbonate (TUMS - DOSED IN MG ELEMENTAL CALCIUM ) 500 MG chewable tablet Chew 1 tablet by mouth daily.     cholecalciferol  (VITAMIN D3) 25 MCG (1000 UNIT) tablet Take 1,000 Units by mouth daily.     Cyanocobalamin  (VITAMIN B-12 PO) Take 1,000 mcg by mouth  daily.     docusate sodium  (COLACE) 100 MG capsule Take 1 capsule (100 mg total) by mouth every 12 (twelve) hours. 60 capsule 0   ELIQUIS  5 MG TABS tablet TAKE ONE TABLET BY MOUTH TWICE A DAY 60 tablet 6   fluticasone  (FLONASE ) 50 MCG/ACT nasal spray Place 1 spray into both nostrils daily as needed for allergies or rhinitis.     gabapentin  (NEURONTIN ) 300 MG capsule TAKE TWO CAPSULES BY MOUTH TWICE A DAY 360 capsule 1   hydrALAZINE  (APRESOLINE ) 100 MG tablet Take 1 tablet (100 mg total) by mouth 3 (three) times daily. 270 tablet 3   ketoconazole  (NIZORAL ) 2 % cream as needed for irritation.     levETIRAcetam (KEPPRA) 500 MG tablet Take 500 mg by mouth 2 (two) times daily.     metolazone  (ZAROXOLYN ) 2.5 MG tablet Take 1 tablet (2.5 mg total) by mouth every other day. (Patient taking differently: Take 2.5 mg by mouth 3 times/day as needed-between meals & bedtime (edema). Taking 1/2 ever other day or every three days)     oxyCODONE -acetaminophen  (PERCOCET) 10-325 MG tablet Take 1 tablet by mouth every 6 (six) hours as needed for pain. 15 tablet 0   polyethylene glycol (MIRALAX ) 17 g packet Take 17 g by mouth daily. 14 each 0   polyethylene glycol powder  (GLYCOLAX /MIRALAX ) 17 GM/SCOOP powder Take 1 g by mouth daily.     potassium chloride  SA (KLOR-CON  M) 20 MEQ tablet TAKE TWO TABLETS BY MOUTH ONE TIME DAILY . THEN TAKE AN EXTRA TABLET ON THE DAY YOU TAKE THE METOLAZONE  180 tablet 3   psyllium (METAMUCIL) 58.6 % powder Take by mouth at bedtime. 3 tsp daily hs     sacubitril-valsartan (ENTRESTO ) 49-51 MG Take 1 tablet by mouth 2 (two) times daily.     sodium chloride  (OCEAN) 0.65 % SOLN nasal spray Place 1 spray into both nostrils as needed for congestion.     spironolactone  (ALDACTONE ) 25 MG tablet Take 1 tablet (25 mg total) by mouth daily.     sulfamethoxazole-trimethoprim (BACTRIM DS) 800-160 MG tablet Take 1 tablet by mouth 3 (three) times a week.     torsemide  (DEMADEX ) 20 MG tablet Take 2 tablets (40 mg total) by mouth daily. 90 tablet 3     Discharge Medications: Please see discharge summary for a list of discharge medications.  Relevant Imaging Results:  Relevant Lab Results:   Additional Information SSN: [id number]  Jabier Martens, LCSW

## 2023-09-27 NOTE — Evaluation (Signed)
 Physical Therapy Evaluation Patient Details Name: Travis [name] MRN: [id number] DOB: February 19, 1951 Today's Date: 09/27/2023  History of Present Illness  Travis [name] is a 73 y.o. male who presents emergency room  09/27/23 for evaluation of generalized weakness and frequent falls. with PMH multiple myeloma metastatic, paroxysmal A-fib , DVT, HTN, HH, OSA, Peripheral neuropathy, cervical spinal stenosis, T2 tumor resetion, PLIF T1-T4.  Clinical Impression  Pt admitted with above diagnosis.  Pt currently with functional limitations due to the deficits listed below (see PT Problem List). Pt will benefit from acute skilled PT to increase their independence and safety with mobility to allow discharge.     The patient reports multiple falls recently, especially negotiating steps. Patient reports that his legs just give way. Patient reports wife unable to assist and has to call EMS when he falls.  Patient presents with significant LE weakness, requires Max support of 2 persons to power up to stand in [facility] lift equipment to transfer safely to and from [facility]. Patient will benefit from continued inpatient follow up therapy, <3 hours/day.  Patient reports that his cancer MD suspects  medication may be the cause of the  leg weakness.        If plan is discharge home, recommend the following: Two people to help with walking and/or transfers;A lot of help with bathing/dressing/bathroom;Assistance with cooking/housework;Assist for transportation;Help with stairs or ramp for entrance   Can travel by private vehicle   No    Equipment Recommendations None recommended by PT  Recommendations for Other Services  OT consult    Functional Status Assessment Patient has had a recent decline in their functional status and demonstrates the ability to make significant improvements in function in a reasonable and predictable amount of time.     Precautions / Restrictions Precautions Precautions:  Fall Restrictions Weight Bearing Restrictions Per Provider Order: No      Mobility  Bed Mobility Overal bed mobility: Needs Assistance Bed Mobility: Rolling, Supine to Sit, Sit to Supine Rolling: Max assist, Used rails   Supine to sit: Max assist, +2 for safety/equipment, +2 for physical assistance Sit to supine: Max assist, +2 for safety/equipment, +2 for physical assistance   General bed mobility comments: assist legs and trunk    Transfers Overall transfer level: Needs assistance   Transfers: Sit to/from Stand Sit to Stand: +2 physical assistance, +2 safety/equipment, From elevated surface, Via lift equipment, Max assist           General transfer comment: max assist to stand at [facility], stood from  stretcher and then from [facility] with more support required, nearly total to power up with 2 persons  from low BSC. Transfer via [facility] To and from [facility]. Transfer via Lift Equipment: Stedy  Ambulation/Gait                  Stairs            Wheelchair Mobility     Tilt Bed    Modified Rankin (Stroke Patients Only)       Balance Overall balance assessment: Needs assistance, History of Falls Sitting-balance support: Bilateral upper extremity supported, Feet supported Sitting balance-Leahy Scale: Poor   Postural control: Posterior lean Standing balance support: Bilateral upper extremity supported, During functional activity, Reliant on assistive device for balance Standing balance-Leahy Scale: Zero  Pertinent Vitals/Pain Pain Assessment Pain Assessment: Faces Faces Pain Scale: Hurts even more Pain Location: neck and abck Pain Descriptors / Indicators: Grimacing, Discomfort Pain Intervention(s): Monitored during session    Home Living Family/patient expects to be discharged to:: Private residence Living Arrangements: Spouse/significant other Available Help at Discharge: Family;Available 24 hours/day Type  of Home: House Home Access: Stairs to enter Entrance Stairs-Rails: Right Entrance Stairs-Number of Steps: 4   Home Layout: Two level;Able to live on main level with bedroom/bathroom Home Equipment: Cane - single point;Rolling Walker (2 wheels);Shower seat - built in;Hand held shower head Additional Comments: Metallurgist    Prior Function Prior Level of Function : History of Falls (last six months);Needs assist       Physical Assist : Mobility (physical)     Mobility Comments: has not been able to negotiate steps last few weeks, has fallen multiple times       Extremity/Trunk Assessment   Upper Extremity Assessment Upper Extremity Assessment: Overall WFL for tasks assessed    Lower Extremity Assessment Lower Extremity Assessment: Generalized weakness    Cervical / Trunk Assessment Cervical / Trunk Assessment: Back Surgery;Neck Surgery;Other exceptions Cervical / Trunk Exceptions: body habitus  Communication   Communication Communication: No apparent difficulties    Cognition Arousal: Alert     PT - Cognitive impairments: No apparent impairments                         Following commands: Intact       Cueing       General Comments      Exercises     Assessment/Plan    PT Assessment Patient needs continued PT services  PT Problem List Decreased strength;Decreased range of motion;Decreased cognition;Decreased activity tolerance;Impaired sensation;Decreased balance;Decreased mobility;Decreased safety awareness       PT Treatment Interventions DME instruction;Therapeutic exercise;Gait training;Balance training;Functional mobility training;Therapeutic activities;Patient/family education    PT Goals (Current goals can be found in the Care Plan section)  Acute Rehab PT Goals Patient Stated Goal: I need to go to rehab PT Goal Formulation: With patient Time For Goal Achievement: 10/11/23 Potential to Achieve Goals: Fair    Frequency Min  2X/week     Co-evaluation               AM-PAC PT "6 Clicks" Mobility  Outcome Measure Help needed turning from your back to your side while in a flat bed without using bedrails?: A Lot Help needed moving from lying on your back to sitting on the side of a flat bed without using bedrails?: Total Help needed moving to and from a bed to a chair (including a wheelchair)?: Total Help needed standing up from a chair using your arms (e.g., wheelchair or bedside chair)?: Total Help needed to walk in hospital room?: Total Help needed climbing 3-5 steps with a railing? : Total 6 Click Score: 7    End of Session Equipment Utilized During Treatment: Gait belt Activity Tolerance: Patient tolerated treatment well Patient left: in bed;with bed alarm set Nurse Communication: Mobility status;Need for lift equipment PT Visit Diagnosis: Unsteadiness on feet (R26.81);Other symptoms and signs involving the nervous system (R29.898);Muscle weakness (generalized) (M62.81);History of falling (Z91.81)    Time: 1610-9604 PT Time Calculation (min) (ACUTE ONLY): 26 min   Charges:   PT Evaluation $PT Eval Low Complexity: 1 Low PT Treatments $Therapeutic Activity: 8-22 mins PT General Charges $$ ACUTE PT VISIT: 1 Visit  Abelina Hoes PT Acute Rehabilitation Services Office [phone]   Dareen Ebbing 09/27/2023, 10:35 AM

## 2023-09-27 NOTE — Progress Notes (Signed)
Awaiting PT.  

## 2023-09-28 DIAGNOSIS — M1990 Unspecified osteoarthritis, unspecified site: Secondary | ICD-10-CM | POA: Diagnosis not present

## 2023-09-28 DIAGNOSIS — M5442 Lumbago with sciatica, left side: Secondary | ICD-10-CM | POA: Diagnosis not present

## 2023-09-28 DIAGNOSIS — M159 Polyosteoarthritis, unspecified: Secondary | ICD-10-CM | POA: Diagnosis not present

## 2023-09-28 DIAGNOSIS — R569 Unspecified convulsions: Secondary | ICD-10-CM | POA: Diagnosis not present

## 2023-09-28 DIAGNOSIS — I110 Hypertensive heart disease with heart failure: Secondary | ICD-10-CM | POA: Diagnosis not present

## 2023-09-28 DIAGNOSIS — G4731 Primary central sleep apnea: Secondary | ICD-10-CM | POA: Diagnosis not present

## 2023-09-28 DIAGNOSIS — M25572 Pain in left ankle and joints of left foot: Secondary | ICD-10-CM | POA: Diagnosis not present

## 2023-09-28 DIAGNOSIS — M25551 Pain in right hip: Secondary | ICD-10-CM | POA: Diagnosis not present

## 2023-09-28 DIAGNOSIS — Z7901 Long term (current) use of anticoagulants: Secondary | ICD-10-CM | POA: Diagnosis not present

## 2023-09-28 DIAGNOSIS — K862 Cyst of pancreas: Secondary | ICD-10-CM | POA: Diagnosis not present

## 2023-09-28 DIAGNOSIS — C9000 Multiple myeloma not having achieved remission: Secondary | ICD-10-CM | POA: Diagnosis not present

## 2023-09-28 DIAGNOSIS — D696 Thrombocytopenia, unspecified: Secondary | ICD-10-CM | POA: Diagnosis not present

## 2023-09-28 DIAGNOSIS — Z8249 Family history of ischemic heart disease and other diseases of the circulatory system: Secondary | ICD-10-CM | POA: Diagnosis not present

## 2023-09-28 DIAGNOSIS — G4733 Obstructive sleep apnea (adult) (pediatric): Secondary | ICD-10-CM | POA: Diagnosis not present

## 2023-09-28 DIAGNOSIS — I509 Heart failure, unspecified: Secondary | ICD-10-CM | POA: Diagnosis not present

## 2023-09-28 DIAGNOSIS — E876 Hypokalemia: Secondary | ICD-10-CM | POA: Diagnosis not present

## 2023-09-28 DIAGNOSIS — I480 Paroxysmal atrial fibrillation: Secondary | ICD-10-CM | POA: Diagnosis not present

## 2023-09-28 DIAGNOSIS — G510 Bell's palsy: Secondary | ICD-10-CM | POA: Diagnosis not present

## 2023-09-28 DIAGNOSIS — M545 Low back pain, unspecified: Secondary | ICD-10-CM | POA: Diagnosis not present

## 2023-09-28 DIAGNOSIS — H532 Diplopia: Secondary | ICD-10-CM | POA: Diagnosis not present

## 2023-09-28 DIAGNOSIS — Z79899 Other long term (current) drug therapy: Secondary | ICD-10-CM | POA: Diagnosis not present

## 2023-09-28 DIAGNOSIS — M5441 Lumbago with sciatica, right side: Secondary | ICD-10-CM | POA: Diagnosis not present

## 2023-09-28 DIAGNOSIS — G620 Drug-induced polyneuropathy: Secondary | ICD-10-CM | POA: Diagnosis not present

## 2023-09-28 DIAGNOSIS — S299XXA Unspecified injury of thorax, initial encounter: Secondary | ICD-10-CM | POA: Diagnosis not present

## 2023-09-28 DIAGNOSIS — Z7401 Bed confinement status: Secondary | ICD-10-CM | POA: Diagnosis not present

## 2023-09-28 DIAGNOSIS — Z86718 Personal history of other venous thrombosis and embolism: Secondary | ICD-10-CM | POA: Diagnosis not present

## 2023-09-28 DIAGNOSIS — T50Z95A Adverse effect of other vaccines and biological substances, initial encounter: Secondary | ICD-10-CM | POA: Diagnosis not present

## 2023-09-28 DIAGNOSIS — G4730 Sleep apnea, unspecified: Secondary | ICD-10-CM | POA: Diagnosis not present

## 2023-09-28 DIAGNOSIS — M47817 Spondylosis without myelopathy or radiculopathy, lumbosacral region: Secondary | ICD-10-CM | POA: Diagnosis not present

## 2023-09-28 DIAGNOSIS — S0990XA Unspecified injury of head, initial encounter: Secondary | ICD-10-CM | POA: Diagnosis not present

## 2023-09-28 DIAGNOSIS — G6289 Other specified polyneuropathies: Secondary | ICD-10-CM | POA: Diagnosis not present

## 2023-09-28 DIAGNOSIS — G629 Polyneuropathy, unspecified: Secondary | ICD-10-CM | POA: Diagnosis not present

## 2023-09-28 DIAGNOSIS — I4891 Unspecified atrial fibrillation: Secondary | ICD-10-CM | POA: Diagnosis not present

## 2023-09-28 DIAGNOSIS — D649 Anemia, unspecified: Secondary | ICD-10-CM | POA: Diagnosis not present

## 2023-09-28 DIAGNOSIS — E7439 Other disorders of intestinal carbohydrate absorption: Secondary | ICD-10-CM | POA: Diagnosis not present

## 2023-09-28 DIAGNOSIS — S3991XA Unspecified injury of abdomen, initial encounter: Secondary | ICD-10-CM | POA: Diagnosis not present

## 2023-09-28 DIAGNOSIS — I8290 Acute embolism and thrombosis of unspecified vein: Secondary | ICD-10-CM | POA: Diagnosis not present

## 2023-09-28 DIAGNOSIS — D801 Nonfamilial hypogammaglobulinemia: Secondary | ICD-10-CM | POA: Diagnosis not present

## 2023-09-28 DIAGNOSIS — R6889 Other general symptoms and signs: Secondary | ICD-10-CM | POA: Diagnosis not present

## 2023-09-28 DIAGNOSIS — E538 Deficiency of other specified B group vitamins: Secondary | ICD-10-CM | POA: Diagnosis not present

## 2023-09-28 DIAGNOSIS — C9002 Multiple myeloma in relapse: Secondary | ICD-10-CM | POA: Diagnosis not present

## 2023-09-28 DIAGNOSIS — Z96641 Presence of right artificial hip joint: Secondary | ICD-10-CM | POA: Diagnosis not present

## 2023-09-28 DIAGNOSIS — Z9181 History of falling: Secondary | ICD-10-CM | POA: Diagnosis not present

## 2023-09-28 DIAGNOSIS — K219 Gastro-esophageal reflux disease without esophagitis: Secondary | ICD-10-CM | POA: Diagnosis not present

## 2023-09-28 DIAGNOSIS — R1310 Dysphagia, unspecified: Secondary | ICD-10-CM | POA: Diagnosis not present

## 2023-09-28 DIAGNOSIS — M48061 Spinal stenosis, lumbar region without neurogenic claudication: Secondary | ICD-10-CM | POA: Diagnosis not present

## 2023-09-28 DIAGNOSIS — I272 Pulmonary hypertension, unspecified: Secondary | ICD-10-CM | POA: Diagnosis not present

## 2023-09-28 DIAGNOSIS — Z743 Need for continuous supervision: Secondary | ICD-10-CM | POA: Diagnosis not present

## 2023-09-28 DIAGNOSIS — K5909 Other constipation: Secondary | ICD-10-CM | POA: Diagnosis not present

## 2023-09-28 DIAGNOSIS — R296 Repeated falls: Secondary | ICD-10-CM | POA: Diagnosis not present

## 2023-09-28 DIAGNOSIS — I5032 Chronic diastolic (congestive) heart failure: Secondary | ICD-10-CM | POA: Diagnosis not present

## 2023-09-28 DIAGNOSIS — R531 Weakness: Secondary | ICD-10-CM | POA: Diagnosis not present

## 2023-09-28 DIAGNOSIS — R2689 Other abnormalities of gait and mobility: Secondary | ICD-10-CM | POA: Diagnosis not present

## 2023-09-28 DIAGNOSIS — I10 Essential (primary) hypertension: Secondary | ICD-10-CM | POA: Diagnosis not present

## 2023-09-28 DIAGNOSIS — D559 Anemia due to enzyme disorder, unspecified: Secondary | ICD-10-CM | POA: Diagnosis not present

## 2023-09-28 DIAGNOSIS — R2681 Unsteadiness on feet: Secondary | ICD-10-CM | POA: Diagnosis not present

## 2023-09-28 DIAGNOSIS — Z823 Family history of stroke: Secondary | ICD-10-CM | POA: Diagnosis not present

## 2023-09-28 DIAGNOSIS — M549 Dorsalgia, unspecified: Secondary | ICD-10-CM | POA: Diagnosis not present

## 2023-09-28 DIAGNOSIS — I499 Cardiac arrhythmia, unspecified: Secondary | ICD-10-CM | POA: Diagnosis not present

## 2023-09-28 DIAGNOSIS — M6281 Muscle weakness (generalized): Secondary | ICD-10-CM | POA: Diagnosis not present

## 2023-09-28 DIAGNOSIS — G894 Chronic pain syndrome: Secondary | ICD-10-CM | POA: Diagnosis not present

## 2023-09-28 DIAGNOSIS — E559 Vitamin D deficiency, unspecified: Secondary | ICD-10-CM | POA: Diagnosis not present

## 2023-09-28 NOTE — Evaluation (Signed)
 Occupational Therapy Evaluation Patient Details Name: Travis [name] MRN: [id number] DOB: 07-02-1950 Today's Date: 09/28/2023   History of Present Illness   Travis [name] is a 73 y.o. male who presents emergency room  09/27/23 for evaluation of generalized weakness and frequent falls. with PMH multiple myeloma metastatic, paroxysmal A-fib , DVT, HTN, HH, OSA, Peripheral neuropathy, cervical spinal stenosis, T2 tumor resetion, PLIF T1-T4.     Clinical Impressions Patient is a 73 year old male who was admitted for above. Patient was living at home with spouse prior level. Currently, patient is +2 max A for sit to stand and mod A +2 to take steps to [facility]. Patient was noted to have decreased functional activity tolerance, decreased endurance, decreased standing balance, decreased safety awareness, and decreased knowledge of AD/AE impacting participation in ADLs. Patient will benefit from continued inpatient follow up therapy, <3 hours/day.      If plan is discharge home, recommend the following:   Two people to help with walking and/or transfers;A lot of help with bathing/dressing/bathroom;Assistance with cooking/housework;Direct supervision/assist for medications management;Assist for transportation;Direct supervision/assist for financial management;Help with stairs or ramp for entrance     Functional Status Assessment   Patient has had a recent decline in their functional status and demonstrates the ability to make significant improvements in function in a reasonable and predictable amount of time.     Equipment Recommendations   None recommended by OT      Precautions/Restrictions   Precautions Precautions: Fall Restrictions Weight Bearing Restrictions Per Provider Order: No     Mobility Bed Mobility Overal bed mobility: Needs Assistance Bed Mobility: Rolling, Supine to Sit, Sit to Supine Rolling: Max assist, Used rails   Supine to sit: Max assist, +2 for  safety/equipment, +2 for physical assistance Sit to supine: Max assist, +2 for safety/equipment, +2 for physical assistance   General bed mobility comments: assist legs and trunk           Balance Overall balance assessment: Needs assistance, History of Falls Sitting-balance support: Bilateral upper extremity supported, Feet supported Sitting balance-Leahy Scale: Fair     Standing balance support: Bilateral upper extremity supported, During functional activity, Reliant on assistive device for balance Standing balance-Leahy Scale: Poor         ADL either performed or assessed with clinical judgement   ADL Overall ADL's : Needs assistance/impaired Eating/Feeding: Set up;Sitting   Grooming: Sitting;Supervision/safety   Upper Body Bathing: Sitting;Set up   Lower Body Bathing: Bed level;Total assistance   Upper Body Dressing : Sitting;Set up   Lower Body Dressing: Bed level;Total assistance Lower Body Dressing Details (indicate cue type and reason): to don shoes. had air cast on L ankle. Toilet Transfer: +2 for physical assistance;+2 for safety/equipment;Maximal assistance;Rolling walker (2 wheels);Stand-pivot;BSC/3in1 Statistician Details (indicate cue type and reason): cues to lean nose over toes. Toileting- Architect and Hygiene: Sit to/from stand;Total assistance Toileting - Clothing Manipulation Details (indicate cue type and reason): unable to take hands off walker to assist with clothing management or hygiene.             Vision   Vision Assessment?: Wears glasses for reading            Pertinent Vitals/Pain Pain Assessment Pain Assessment: Faces Faces Pain Scale: Hurts even more Pain Location: neck and back Pain Descriptors / Indicators: Grimacing, Discomfort Pain Intervention(s): Monitored during session     Extremity/Trunk Assessment Upper Extremity Assessment Upper Extremity Assessment: Overall WFL for tasks assessed  Lower  Extremity Assessment Lower Extremity Assessment: Defer to PT evaluation   Cervical / Trunk Assessment Cervical / Trunk Assessment: Back Surgery;Neck Surgery;Other exceptions Cervical / Trunk Exceptions: body habitus   Communication     Cognition Arousal: Alert Behavior During Therapy: WFL for tasks assessed/performed Cognition: No apparent impairments                               Following commands: Intact                  Home Living Family/patient expects to be discharged to:: Private residence Living Arrangements: Spouse/significant other Available Help at Discharge: Family;Available 24 hours/day Type of Home: House Home Access: Stairs to enter [employer] of Steps: 4 Entrance Stairs-Rails: Right Home Layout: Two level;Able to live on main level with bedroom/bathroom     Bathroom Shower/Tub: Walk-in shower   Bathroom Toilet: Handicapped height     Home Equipment: Cane - single Librarian, academic (2 wheels);Shower seat - built in;Hand held shower head   Additional Comments: Metallurgist      Prior Functioning/Environment Prior Level of Function : History of Falls (last six months);Needs assist       Physical Assist : Mobility (physical)     Mobility Comments: has not been able to negotiate steps last few weeks, has fallen multiple times      OT Problem List: Decreased activity tolerance;Impaired balance (sitting and/or standing);Decreased safety awareness;Decreased knowledge of precautions;Decreased knowledge of use of DME or AE;Pain   OT Treatment/Interventions: Self-care/ADL training;Energy conservation;Therapeutic exercise;Neuromuscular education;Patient/family education;Therapeutic activities;Balance training      OT Goals(Current goals can be found in the care plan section)   Acute Rehab OT Goals Patient Stated Goal: to get stronger OT Goal Formulation: With patient Time For Goal Achievement: 10/12/23 Potential to  Achieve Goals: Fair   OT Frequency:  Min 2X/week       AM-PAC OT "6 Clicks" Daily Activity     Outcome Measure Help from another person eating meals?: None Help from another person taking care of personal grooming?: A Little Help from another person toileting, which includes using toliet, bedpan, or urinal?: A Lot Help from another person bathing (including washing, rinsing, drying)?: A Lot Help from another person to put on and taking off regular upper body clothing?: A Little Help from another person to put on and taking off regular lower body clothing?: A Lot 6 Click Score: 16   End of Session Equipment Utilized During Treatment: Gait belt;Rolling walker (2 wheels);Other (comment) (BSC) Nurse Communication: Other (comment) (nurse made aware of patients request to take medication and his bm)  Activity Tolerance: Patient tolerated treatment well Patient left: in bed;with call bell/phone within reach  OT Visit Diagnosis: Unsteadiness on feet (R26.81);Other abnormalities of gait and mobility (R26.89);Pain;Muscle weakness (generalized) (M62.81)                Time: 0981-1914 OT Time Calculation (min): 25 min Charges:  OT General Charges $OT Visit: 1 Visit OT Evaluation $OT Eval Low Complexity: 1 Low OT Treatments $Self Care/Home Management : 8-22 mins  Wynette Heckler, MS Acute Rehabilitation Department Office# [phone]   Jame Maze 09/28/2023, 9:53 AM

## 2023-09-28 NOTE — ED Provider Notes (Addendum)
  Physical Exam  BP (!) 149/84 (BP Location: Right Arm)   Pulse 65   Temp 98.4 F (36.9 C) (Oral)   Resp 18   SpO2 99%   Physical Exam  Procedures  Procedures  ED Course / MDM    Medical Decision Making Amount and/or Complexity of Data Reviewed Labs: ordered. Radiology: ordered.  Risk OTC drugs. Prescription drug management.   Pain in the ER for 28 hours.  Weakness and falls.  Hopefully pending placement to [facility].  Accepted at [facility].  Will discharge.       Mozell Arias, MD 09/28/23 Maryl Snook    Mozell Arias, MD 09/28/23 [phone]

## 2023-09-28 NOTE — Progress Notes (Signed)
 Pt will discharge to [facility] .[facility] to transport. Room and report info given to RN via secure chat. EDP aware. No further TOC needs.

## 2023-09-29 ENCOUNTER — Ambulatory Visit

## 2023-09-29 DIAGNOSIS — R569 Unspecified convulsions: Secondary | ICD-10-CM | POA: Diagnosis not present

## 2023-09-29 DIAGNOSIS — R2689 Other abnormalities of gait and mobility: Secondary | ICD-10-CM | POA: Diagnosis not present

## 2023-09-29 DIAGNOSIS — Z9181 History of falling: Secondary | ICD-10-CM | POA: Diagnosis not present

## 2023-09-29 DIAGNOSIS — I8290 Acute embolism and thrombosis of unspecified vein: Secondary | ICD-10-CM | POA: Diagnosis not present

## 2023-09-29 DIAGNOSIS — I480 Paroxysmal atrial fibrillation: Secondary | ICD-10-CM | POA: Diagnosis not present

## 2023-09-29 DIAGNOSIS — I5032 Chronic diastolic (congestive) heart failure: Secondary | ICD-10-CM | POA: Diagnosis not present

## 2023-09-29 DIAGNOSIS — G4730 Sleep apnea, unspecified: Secondary | ICD-10-CM | POA: Diagnosis not present

## 2023-09-29 DIAGNOSIS — M159 Polyosteoarthritis, unspecified: Secondary | ICD-10-CM | POA: Diagnosis not present

## 2023-09-29 DIAGNOSIS — I10 Essential (primary) hypertension: Secondary | ICD-10-CM | POA: Diagnosis not present

## 2023-09-29 DIAGNOSIS — M6281 Muscle weakness (generalized): Secondary | ICD-10-CM | POA: Diagnosis not present

## 2023-09-29 DIAGNOSIS — C9000 Multiple myeloma not having achieved remission: Secondary | ICD-10-CM | POA: Diagnosis not present

## 2023-09-30 DIAGNOSIS — I509 Heart failure, unspecified: Secondary | ICD-10-CM | POA: Diagnosis not present

## 2023-09-30 DIAGNOSIS — I4891 Unspecified atrial fibrillation: Secondary | ICD-10-CM | POA: Diagnosis not present

## 2023-09-30 DIAGNOSIS — M6281 Muscle weakness (generalized): Secondary | ICD-10-CM | POA: Diagnosis not present

## 2023-09-30 DIAGNOSIS — G894 Chronic pain syndrome: Secondary | ICD-10-CM | POA: Diagnosis not present

## 2023-10-02 DIAGNOSIS — Z743 Need for continuous supervision: Secondary | ICD-10-CM | POA: Diagnosis not present

## 2023-10-02 DIAGNOSIS — C9000 Multiple myeloma not having achieved remission: Secondary | ICD-10-CM | POA: Diagnosis not present

## 2023-10-02 DIAGNOSIS — Z79899 Other long term (current) drug therapy: Secondary | ICD-10-CM | POA: Diagnosis not present

## 2023-10-02 DIAGNOSIS — M545 Low back pain, unspecified: Secondary | ICD-10-CM | POA: Diagnosis not present

## 2023-10-02 DIAGNOSIS — Z7401 Bed confinement status: Secondary | ICD-10-CM | POA: Diagnosis not present

## 2023-10-02 DIAGNOSIS — R6889 Other general symptoms and signs: Secondary | ICD-10-CM | POA: Diagnosis not present

## 2023-10-02 DIAGNOSIS — Z7901 Long term (current) use of anticoagulants: Secondary | ICD-10-CM | POA: Diagnosis not present

## 2023-10-02 DIAGNOSIS — M5442 Lumbago with sciatica, left side: Secondary | ICD-10-CM | POA: Diagnosis not present

## 2023-10-02 DIAGNOSIS — M549 Dorsalgia, unspecified: Secondary | ICD-10-CM | POA: Diagnosis not present

## 2023-10-02 DIAGNOSIS — I499 Cardiac arrhythmia, unspecified: Secondary | ICD-10-CM | POA: Diagnosis not present

## 2023-10-02 DIAGNOSIS — M5441 Lumbago with sciatica, right side: Secondary | ICD-10-CM | POA: Diagnosis not present

## 2023-10-03 DIAGNOSIS — I509 Heart failure, unspecified: Secondary | ICD-10-CM | POA: Diagnosis not present

## 2023-10-03 DIAGNOSIS — G894 Chronic pain syndrome: Secondary | ICD-10-CM | POA: Diagnosis not present

## 2023-10-03 DIAGNOSIS — R569 Unspecified convulsions: Secondary | ICD-10-CM | POA: Diagnosis not present

## 2023-10-03 DIAGNOSIS — I5032 Chronic diastolic (congestive) heart failure: Secondary | ICD-10-CM | POA: Diagnosis not present

## 2023-10-03 DIAGNOSIS — C9000 Multiple myeloma not having achieved remission: Secondary | ICD-10-CM | POA: Diagnosis not present

## 2023-10-03 DIAGNOSIS — R2689 Other abnormalities of gait and mobility: Secondary | ICD-10-CM | POA: Diagnosis not present

## 2023-10-03 DIAGNOSIS — I480 Paroxysmal atrial fibrillation: Secondary | ICD-10-CM | POA: Diagnosis not present

## 2023-10-03 DIAGNOSIS — M6281 Muscle weakness (generalized): Secondary | ICD-10-CM | POA: Diagnosis not present

## 2023-10-03 DIAGNOSIS — Z9181 History of falling: Secondary | ICD-10-CM | POA: Diagnosis not present

## 2023-10-03 DIAGNOSIS — I4891 Unspecified atrial fibrillation: Secondary | ICD-10-CM | POA: Diagnosis not present

## 2023-10-06 DIAGNOSIS — R569 Unspecified convulsions: Secondary | ICD-10-CM | POA: Diagnosis not present

## 2023-10-06 DIAGNOSIS — R2689 Other abnormalities of gait and mobility: Secondary | ICD-10-CM | POA: Diagnosis not present

## 2023-10-06 DIAGNOSIS — I4891 Unspecified atrial fibrillation: Secondary | ICD-10-CM | POA: Diagnosis not present

## 2023-10-06 DIAGNOSIS — I5032 Chronic diastolic (congestive) heart failure: Secondary | ICD-10-CM | POA: Diagnosis not present

## 2023-10-06 DIAGNOSIS — I509 Heart failure, unspecified: Secondary | ICD-10-CM | POA: Diagnosis not present

## 2023-10-06 DIAGNOSIS — M6281 Muscle weakness (generalized): Secondary | ICD-10-CM | POA: Diagnosis not present

## 2023-10-06 DIAGNOSIS — I480 Paroxysmal atrial fibrillation: Secondary | ICD-10-CM | POA: Diagnosis not present

## 2023-10-06 DIAGNOSIS — Z9181 History of falling: Secondary | ICD-10-CM | POA: Diagnosis not present

## 2023-10-06 DIAGNOSIS — C9000 Multiple myeloma not having achieved remission: Secondary | ICD-10-CM | POA: Diagnosis not present

## 2023-10-06 DIAGNOSIS — G894 Chronic pain syndrome: Secondary | ICD-10-CM | POA: Diagnosis not present

## 2023-10-07 DIAGNOSIS — I10 Essential (primary) hypertension: Secondary | ICD-10-CM | POA: Diagnosis not present

## 2023-10-07 DIAGNOSIS — M5126 Other intervertebral disc displacement, lumbar region: Secondary | ICD-10-CM | POA: Diagnosis not present

## 2023-10-07 DIAGNOSIS — Z743 Need for continuous supervision: Secondary | ICD-10-CM | POA: Diagnosis not present

## 2023-10-07 DIAGNOSIS — Z96641 Presence of right artificial hip joint: Secondary | ICD-10-CM | POA: Diagnosis not present

## 2023-10-07 DIAGNOSIS — R531 Weakness: Secondary | ICD-10-CM | POA: Diagnosis not present

## 2023-10-07 DIAGNOSIS — R296 Repeated falls: Secondary | ICD-10-CM | POA: Diagnosis not present

## 2023-10-07 DIAGNOSIS — I5033 Acute on chronic diastolic (congestive) heart failure: Secondary | ICD-10-CM | POA: Diagnosis not present

## 2023-10-07 DIAGNOSIS — M150 Primary generalized (osteo)arthritis: Secondary | ICD-10-CM | POA: Diagnosis not present

## 2023-10-07 DIAGNOSIS — E559 Vitamin D deficiency, unspecified: Secondary | ICD-10-CM | POA: Diagnosis not present

## 2023-10-07 DIAGNOSIS — I5032 Chronic diastolic (congestive) heart failure: Secondary | ICD-10-CM | POA: Diagnosis not present

## 2023-10-07 DIAGNOSIS — G4731 Primary central sleep apnea: Secondary | ICD-10-CM | POA: Diagnosis not present

## 2023-10-07 DIAGNOSIS — Z86718 Personal history of other venous thrombosis and embolism: Secondary | ICD-10-CM | POA: Diagnosis not present

## 2023-10-07 DIAGNOSIS — C9000 Multiple myeloma not having achieved remission: Secondary | ICD-10-CM | POA: Diagnosis not present

## 2023-10-07 DIAGNOSIS — G6289 Other specified polyneuropathies: Secondary | ICD-10-CM | POA: Diagnosis not present

## 2023-10-07 DIAGNOSIS — R6889 Other general symptoms and signs: Secondary | ICD-10-CM | POA: Diagnosis not present

## 2023-10-07 DIAGNOSIS — E785 Hyperlipidemia, unspecified: Secondary | ICD-10-CM | POA: Diagnosis not present

## 2023-10-07 DIAGNOSIS — C9002 Multiple myeloma in relapse: Secondary | ICD-10-CM | POA: Diagnosis not present

## 2023-10-07 DIAGNOSIS — R278 Other lack of coordination: Secondary | ICD-10-CM | POA: Diagnosis not present

## 2023-10-07 DIAGNOSIS — K219 Gastro-esophageal reflux disease without esophagitis: Secondary | ICD-10-CM | POA: Diagnosis not present

## 2023-10-07 DIAGNOSIS — Z7901 Long term (current) use of anticoagulants: Secondary | ICD-10-CM | POA: Diagnosis not present

## 2023-10-07 DIAGNOSIS — D801 Nonfamilial hypogammaglobulinemia: Secondary | ICD-10-CM | POA: Diagnosis not present

## 2023-10-07 DIAGNOSIS — M5127 Other intervertebral disc displacement, lumbosacral region: Secondary | ICD-10-CM | POA: Diagnosis not present

## 2023-10-07 DIAGNOSIS — Z823 Family history of stroke: Secondary | ICD-10-CM | POA: Diagnosis not present

## 2023-10-07 DIAGNOSIS — D649 Anemia, unspecified: Secondary | ICD-10-CM | POA: Diagnosis not present

## 2023-10-07 DIAGNOSIS — E876 Hypokalemia: Secondary | ICD-10-CM | POA: Diagnosis not present

## 2023-10-07 DIAGNOSIS — G620 Drug-induced polyneuropathy: Secondary | ICD-10-CM | POA: Diagnosis not present

## 2023-10-07 DIAGNOSIS — I272 Pulmonary hypertension, unspecified: Secondary | ICD-10-CM | POA: Diagnosis not present

## 2023-10-07 DIAGNOSIS — G510 Bell's palsy: Secondary | ICD-10-CM | POA: Diagnosis not present

## 2023-10-07 DIAGNOSIS — M438X4 Other specified deforming dorsopathies, thoracic region: Secondary | ICD-10-CM | POA: Diagnosis not present

## 2023-10-07 DIAGNOSIS — H532 Diplopia: Secondary | ICD-10-CM | POA: Diagnosis not present

## 2023-10-07 DIAGNOSIS — Z8249 Family history of ischemic heart disease and other diseases of the circulatory system: Secondary | ICD-10-CM | POA: Diagnosis not present

## 2023-10-07 DIAGNOSIS — I110 Hypertensive heart disease with heart failure: Secondary | ICD-10-CM | POA: Diagnosis not present

## 2023-10-07 DIAGNOSIS — Z92850 Personal history of chimeric antigen receptor t-cell therapy: Secondary | ICD-10-CM | POA: Diagnosis not present

## 2023-10-07 DIAGNOSIS — M47817 Spondylosis without myelopathy or radiculopathy, lumbosacral region: Secondary | ICD-10-CM | POA: Diagnosis not present

## 2023-10-07 DIAGNOSIS — D809 Immunodeficiency with predominantly antibody defects, unspecified: Secondary | ICD-10-CM | POA: Diagnosis not present

## 2023-10-07 DIAGNOSIS — G4733 Obstructive sleep apnea (adult) (pediatric): Secondary | ICD-10-CM | POA: Diagnosis not present

## 2023-10-07 DIAGNOSIS — M48061 Spinal stenosis, lumbar region without neurogenic claudication: Secondary | ICD-10-CM | POA: Diagnosis not present

## 2023-10-07 DIAGNOSIS — T50Z95A Adverse effect of other vaccines and biological substances, initial encounter: Secondary | ICD-10-CM | POA: Diagnosis not present

## 2023-10-07 DIAGNOSIS — Z79899 Other long term (current) drug therapy: Secondary | ICD-10-CM | POA: Diagnosis not present

## 2023-10-07 DIAGNOSIS — R29898 Other symptoms and signs involving the musculoskeletal system: Secondary | ICD-10-CM | POA: Diagnosis not present

## 2023-10-07 DIAGNOSIS — Z9289 Personal history of other medical treatment: Secondary | ICD-10-CM | POA: Diagnosis not present

## 2023-10-07 DIAGNOSIS — G529 Cranial nerve disorder, unspecified: Secondary | ICD-10-CM | POA: Diagnosis not present

## 2023-10-07 DIAGNOSIS — K2100 Gastro-esophageal reflux disease with esophagitis, without bleeding: Secondary | ICD-10-CM | POA: Diagnosis not present

## 2023-10-07 DIAGNOSIS — M47816 Spondylosis without myelopathy or radiculopathy, lumbar region: Secondary | ICD-10-CM | POA: Diagnosis not present

## 2023-10-07 DIAGNOSIS — G5793 Unspecified mononeuropathy of bilateral lower limbs: Secondary | ICD-10-CM | POA: Diagnosis not present

## 2023-10-07 DIAGNOSIS — M6281 Muscle weakness (generalized): Secondary | ICD-10-CM | POA: Diagnosis not present

## 2023-10-07 DIAGNOSIS — K862 Cyst of pancreas: Secondary | ICD-10-CM | POA: Diagnosis not present

## 2023-10-07 DIAGNOSIS — M47812 Spondylosis without myelopathy or radiculopathy, cervical region: Secondary | ICD-10-CM | POA: Diagnosis not present

## 2023-10-07 DIAGNOSIS — K449 Diaphragmatic hernia without obstruction or gangrene: Secondary | ICD-10-CM | POA: Diagnosis not present

## 2023-10-07 DIAGNOSIS — D519 Vitamin B12 deficiency anemia, unspecified: Secondary | ICD-10-CM | POA: Diagnosis not present

## 2023-10-07 DIAGNOSIS — D696 Thrombocytopenia, unspecified: Secondary | ICD-10-CM | POA: Diagnosis not present

## 2023-10-07 DIAGNOSIS — I480 Paroxysmal atrial fibrillation: Secondary | ICD-10-CM | POA: Diagnosis not present

## 2023-10-10 DIAGNOSIS — R6889 Other general symptoms and signs: Secondary | ICD-10-CM | POA: Diagnosis not present

## 2023-10-28 DIAGNOSIS — D809 Immunodeficiency with predominantly antibody defects, unspecified: Secondary | ICD-10-CM | POA: Diagnosis not present

## 2023-10-28 DIAGNOSIS — K2100 Gastro-esophageal reflux disease with esophagitis, without bleeding: Secondary | ICD-10-CM | POA: Diagnosis not present

## 2023-10-28 DIAGNOSIS — D649 Anemia, unspecified: Secondary | ICD-10-CM | POA: Diagnosis not present

## 2023-10-28 DIAGNOSIS — C9000 Multiple myeloma not having achieved remission: Secondary | ICD-10-CM | POA: Diagnosis not present

## 2023-10-28 DIAGNOSIS — M48061 Spinal stenosis, lumbar region without neurogenic claudication: Secondary | ICD-10-CM | POA: Diagnosis not present

## 2023-10-28 DIAGNOSIS — Z86718 Personal history of other venous thrombosis and embolism: Secondary | ICD-10-CM | POA: Diagnosis not present

## 2023-10-28 DIAGNOSIS — D696 Thrombocytopenia, unspecified: Secondary | ICD-10-CM | POA: Diagnosis not present

## 2023-10-28 DIAGNOSIS — I5033 Acute on chronic diastolic (congestive) heart failure: Secondary | ICD-10-CM | POA: Diagnosis not present

## 2023-10-28 DIAGNOSIS — M6281 Muscle weakness (generalized): Secondary | ICD-10-CM | POA: Diagnosis not present

## 2023-10-28 DIAGNOSIS — I5032 Chronic diastolic (congestive) heart failure: Secondary | ICD-10-CM | POA: Diagnosis not present

## 2023-10-28 DIAGNOSIS — R278 Other lack of coordination: Secondary | ICD-10-CM | POA: Diagnosis not present

## 2023-10-28 DIAGNOSIS — I480 Paroxysmal atrial fibrillation: Secondary | ICD-10-CM | POA: Diagnosis not present

## 2023-10-28 DIAGNOSIS — Z743 Need for continuous supervision: Secondary | ICD-10-CM | POA: Diagnosis not present

## 2023-10-28 DIAGNOSIS — G8929 Other chronic pain: Secondary | ICD-10-CM | POA: Diagnosis not present

## 2023-10-28 DIAGNOSIS — R531 Weakness: Secondary | ICD-10-CM | POA: Diagnosis not present

## 2023-10-28 DIAGNOSIS — R29898 Other symptoms and signs involving the musculoskeletal system: Secondary | ICD-10-CM | POA: Diagnosis not present

## 2023-10-28 DIAGNOSIS — E785 Hyperlipidemia, unspecified: Secondary | ICD-10-CM | POA: Diagnosis not present

## 2023-10-28 DIAGNOSIS — K449 Diaphragmatic hernia without obstruction or gangrene: Secondary | ICD-10-CM | POA: Diagnosis not present

## 2023-10-28 DIAGNOSIS — G4733 Obstructive sleep apnea (adult) (pediatric): Secondary | ICD-10-CM | POA: Diagnosis not present

## 2023-10-28 DIAGNOSIS — M150 Primary generalized (osteo)arthritis: Secondary | ICD-10-CM | POA: Diagnosis not present

## 2023-10-28 DIAGNOSIS — D519 Vitamin B12 deficiency anemia, unspecified: Secondary | ICD-10-CM | POA: Diagnosis not present

## 2023-10-28 DIAGNOSIS — E559 Vitamin D deficiency, unspecified: Secondary | ICD-10-CM | POA: Diagnosis not present

## 2023-10-28 DIAGNOSIS — C9001 Multiple myeloma in remission: Secondary | ICD-10-CM | POA: Diagnosis present

## 2023-10-28 DIAGNOSIS — G5793 Unspecified mononeuropathy of bilateral lower limbs: Secondary | ICD-10-CM | POA: Diagnosis not present

## 2023-10-28 DIAGNOSIS — Z92850 Personal history of chimeric antigen receptor t-cell therapy: Secondary | ICD-10-CM | POA: Diagnosis not present

## 2023-10-28 DIAGNOSIS — R6889 Other general symptoms and signs: Secondary | ICD-10-CM | POA: Diagnosis not present

## 2023-10-28 DIAGNOSIS — I10 Essential (primary) hypertension: Secondary | ICD-10-CM | POA: Diagnosis not present

## 2023-10-28 DIAGNOSIS — M545 Low back pain, unspecified: Secondary | ICD-10-CM | POA: Diagnosis not present

## 2023-10-29 ENCOUNTER — Emergency Department
Admission: EM | Admit: 2023-10-29 | Discharge: 2023-10-29 | Disposition: A | Discharge status: Nursing Home (Includes ICF) | Attending: Emergency Medicine | Admitting: Emergency Medicine

## 2023-10-29 ENCOUNTER — Other Ambulatory Visit: Payer: Self-pay

## 2023-10-29 DIAGNOSIS — G8929 Other chronic pain: Secondary | ICD-10-CM | POA: Insufficient documentation

## 2023-10-29 DIAGNOSIS — C9000 Multiple myeloma not having achieved remission: Secondary | ICD-10-CM | POA: Diagnosis not present

## 2023-10-29 DIAGNOSIS — C9001 Multiple myeloma in remission: Secondary | ICD-10-CM | POA: Insufficient documentation

## 2023-10-29 DIAGNOSIS — M545 Low back pain, unspecified: Secondary | ICD-10-CM | POA: Diagnosis not present

## 2023-10-29 DIAGNOSIS — R52 Pain, unspecified: Secondary | ICD-10-CM

## 2023-10-29 MED ORDER — HYDROMORPHONE HCL 2 MG PO TABS: 1.0000 mg | ORAL_TABLET | Freq: Once | ORAL | Status: DC

## 2023-10-29 MED ORDER — HYDROMORPHONE HCL 2 MG PO TABS
2.0000 mg | ORAL_TABLET | ORAL | Status: AC
  Administered 2023-10-29: 2 mg via ORAL
  Filled 2023-10-29: qty 1

## 2023-10-29 MED ORDER — HYDROMORPHONE HCL 2 MG PO TABS
1.0000 mg | ORAL_TABLET | Freq: Once | ORAL | Status: AC
  Administered 2023-10-29: 1 mg via ORAL
  Filled 2023-10-29: qty 1

## 2023-10-29 MED ORDER — OXYCODONE HCL 5 MG PO TABS: 10.0000 mg | ORAL_TABLET | Freq: Once | ORAL | Status: DC

## 2023-10-29 NOTE — ED Provider Notes (Signed)
 [facility] Provider Note    Event Date/Time   First MD Initiated Contact with Patient 10/29/23 [phone]     (approximate)   History   General Pain   HPI  Travis [name] is a 73 y.o. male   Past medical history of multiple medical comorbidities but most pertinent is multiple myeloma and chronic pain associated with his multiple myeloma who presents to the emergency department with pain.  He was seen at [facility] and discharged to rehab facility on multiple modal pain control including Dilaudid  which has been helping his pain the most.  Since being discharged to his rehab facility yesterday he has gone without his Dilaudid  because his rehab facility does not have this medication but patient notes that they will obtain on the morning of 6/28.  Since his pain became quite severe without the Dilaudid  he came here for medication.  He denies any other acute medical complaints and has had no trauma since his discharge from the hospital to rehab yesterday   External Medical Documents Reviewed: [facility] notes for recent hospitalization and medication management      Physical Exam   Triage Vital Signs: ED Triage Vitals  Encounter Vitals Group     BP 10/29/23 0014 (!) 169/82     Girls Systolic BP Percentile --      Girls Diastolic BP Percentile --      Boys Systolic BP Percentile --      Boys Diastolic BP Percentile --      Pulse Rate 10/29/23 0014 64     Resp 10/29/23 0014 18     Temp 10/29/23 0014 98.2 F (36.8 C)     Temp Source 10/29/23 0014 Oral     SpO2 10/29/23 0014 97 %     Weight --      Height --      Head Circumference --      Peak Flow --      Pain Score 10/29/23 0031 10     Pain Loc --      Pain Education --      Exclude from Growth Chart --     Most recent vital signs: Vitals:   10/29/23 0300 10/29/23 0330  BP: (!) 166/80 (!) 163/79  Pulse: 61 61  Resp:    Temp:    SpO2: 96% 97%    General: Awake, no distress.   CV:  Good peripheral perfusion.  Resp:  Normal effort.  Abd:  No distention.  Other:  Hypertensive otherwise vital signs normal.  Bilateral lower extremity weakness states is chronic but able to range gingerly.   ED Results / Procedures / Treatments   Labs (all labs ordered are listed, but only abnormal results are displayed) Labs Reviewed - No data to display   PROCEDURES:  Critical Care performed: No  Procedures   MEDICATIONS ORDERED IN ED: Medications  HYDROmorphone  (DILAUDID ) tablet 1 mg (has no administration in time range)  HYDROmorphone  (DILAUDID ) tablet 2 mg (2 mg Oral Given 10/29/23 0127)    IMPRESSION / MDM / ASSESSMENT AND PLAN / ED COURSE  I reviewed the triage vital signs and the nursing notes.                                Patient's presentation is most consistent with severe exacerbation of chronic illness.  Differential diagnosis includes, but is not limited to, acute exacerbation of  chronic pain due to multiple myeloma, considered other traumatic injuries but doubtful given no reports of new trauma   MDM: Pain is quite bad without his Dilaudid  since his rehab facility does not have this medication, he doubts any new acute pain or injuries and so we will defer further workup instead treat him with a dose of Dilaudid , which worked quite well to make him more comfortable.  Will discharge in the morning to his rehab facility.\       FINAL CLINICAL IMPRESSION(S) / ED DIAGNOSES   Final diagnoses:  Multiple myeloma, remission status unspecified (HCC)  Pain     Rx / DC Orders   ED Discharge Orders     None        Note:  This document was prepared using Dragon voice recognition software and may include unintentional dictation errors.    Cyrena Mylar, MD 10/29/23 [phone]

## 2023-10-29 NOTE — ED Triage Notes (Signed)
 FIRST NURSE NOTE:  Pt arrived via [facility] for pain management, pt requested to come to [facility] even though he was dc'd from [facility] at 1630 tonight.  Pt comes from [facility].    Pt has multiple myeloma was admitted for 20 days, pt is requesting dilaudid  that was prescribed, but facility does not have the medication at this time.  165/95

## 2023-10-29 NOTE — ED Notes (Signed)
 Pt given water

## 2023-10-29 NOTE — ED Notes (Signed)
 Pillows placed under each buttock for pt comfort. Assisted pt to use urinal. Denies any other needs at this time. Call light within reach. Stretcher in lowest position with wheels locked. Side rails up x 2.

## 2023-10-29 NOTE — ED Triage Notes (Addendum)
 Patient C/O bilateral leg, back and right arm pain. Patient was just sent to [facility] today for rehab from [facility] where he is status post chimeric antigen receptor t-cell therapy. Patient has been described dilaudid , but the rehab center did not have the medication.

## 2023-10-29 NOTE — ED Notes (Signed)
 Pt called out due to spilling urine from his urinal in his bed and needing to be changed. This tech and edt Domenica changed pt's chuck underneath him and removed soiled bedding, pt's urinal emptied and pt slid up in bed. Pt has no other needs at the moment.

## 2023-10-31 DIAGNOSIS — C9000 Multiple myeloma not having achieved remission: Secondary | ICD-10-CM | POA: Diagnosis not present

## 2023-10-31 DIAGNOSIS — G4733 Obstructive sleep apnea (adult) (pediatric): Secondary | ICD-10-CM | POA: Diagnosis not present

## 2023-10-31 DIAGNOSIS — E559 Vitamin D deficiency, unspecified: Secondary | ICD-10-CM | POA: Diagnosis not present

## 2023-10-31 DIAGNOSIS — D519 Vitamin B12 deficiency anemia, unspecified: Secondary | ICD-10-CM | POA: Diagnosis not present

## 2023-10-31 DIAGNOSIS — I5033 Acute on chronic diastolic (congestive) heart failure: Secondary | ICD-10-CM | POA: Diagnosis not present

## 2023-10-31 DIAGNOSIS — R531 Weakness: Secondary | ICD-10-CM | POA: Diagnosis not present

## 2023-10-31 DIAGNOSIS — I10 Essential (primary) hypertension: Secondary | ICD-10-CM | POA: Diagnosis not present

## 2023-11-01 DIAGNOSIS — M48061 Spinal stenosis, lumbar region without neurogenic claudication: Secondary | ICD-10-CM | POA: Diagnosis not present

## 2023-11-01 DIAGNOSIS — C9000 Multiple myeloma not having achieved remission: Secondary | ICD-10-CM | POA: Diagnosis not present

## 2023-11-02 DIAGNOSIS — I5033 Acute on chronic diastolic (congestive) heart failure: Secondary | ICD-10-CM | POA: Diagnosis not present

## 2023-11-02 DIAGNOSIS — I4891 Unspecified atrial fibrillation: Secondary | ICD-10-CM | POA: Diagnosis not present

## 2023-11-02 DIAGNOSIS — R531 Weakness: Secondary | ICD-10-CM | POA: Diagnosis not present

## 2023-11-02 DIAGNOSIS — I10 Essential (primary) hypertension: Secondary | ICD-10-CM | POA: Diagnosis not present

## 2023-11-02 DIAGNOSIS — C9000 Multiple myeloma not having achieved remission: Secondary | ICD-10-CM | POA: Diagnosis not present

## 2023-11-03 DIAGNOSIS — M48061 Spinal stenosis, lumbar region without neurogenic claudication: Secondary | ICD-10-CM | POA: Diagnosis not present

## 2023-11-03 DIAGNOSIS — C9000 Multiple myeloma not having achieved remission: Secondary | ICD-10-CM | POA: Diagnosis not present

## 2023-11-05 DIAGNOSIS — M25571 Pain in right ankle and joints of right foot: Secondary | ICD-10-CM | POA: Diagnosis not present

## 2023-11-08 DIAGNOSIS — C9000 Multiple myeloma not having achieved remission: Secondary | ICD-10-CM | POA: Diagnosis not present

## 2023-11-08 DIAGNOSIS — I10 Essential (primary) hypertension: Secondary | ICD-10-CM | POA: Diagnosis not present

## 2023-11-08 DIAGNOSIS — M48061 Spinal stenosis, lumbar region without neurogenic claudication: Secondary | ICD-10-CM | POA: Diagnosis not present

## 2023-11-08 DIAGNOSIS — I4891 Unspecified atrial fibrillation: Secondary | ICD-10-CM | POA: Diagnosis not present

## 2023-11-08 DIAGNOSIS — I5033 Acute on chronic diastolic (congestive) heart failure: Secondary | ICD-10-CM | POA: Diagnosis not present

## 2023-11-08 DIAGNOSIS — R531 Weakness: Secondary | ICD-10-CM | POA: Diagnosis not present

## 2023-11-09 DIAGNOSIS — I5033 Acute on chronic diastolic (congestive) heart failure: Secondary | ICD-10-CM | POA: Diagnosis not present

## 2023-11-09 DIAGNOSIS — I4891 Unspecified atrial fibrillation: Secondary | ICD-10-CM | POA: Diagnosis not present

## 2023-11-09 DIAGNOSIS — I10 Essential (primary) hypertension: Secondary | ICD-10-CM | POA: Diagnosis not present

## 2023-11-09 DIAGNOSIS — C9000 Multiple myeloma not having achieved remission: Secondary | ICD-10-CM | POA: Diagnosis not present

## 2023-11-09 DIAGNOSIS — R531 Weakness: Secondary | ICD-10-CM | POA: Diagnosis not present

## 2023-11-10 ENCOUNTER — Ambulatory Visit: Admitting: General Practice

## 2023-11-10 DIAGNOSIS — C9000 Multiple myeloma not having achieved remission: Secondary | ICD-10-CM | POA: Diagnosis not present

## 2023-11-10 DIAGNOSIS — M48061 Spinal stenosis, lumbar region without neurogenic claudication: Secondary | ICD-10-CM | POA: Diagnosis not present

## 2023-11-11 DIAGNOSIS — G629 Polyneuropathy, unspecified: Secondary | ICD-10-CM | POA: Diagnosis not present

## 2023-11-11 DIAGNOSIS — C9000 Multiple myeloma not having achieved remission: Secondary | ICD-10-CM | POA: Diagnosis not present

## 2023-11-11 DIAGNOSIS — D472 Monoclonal gammopathy: Secondary | ICD-10-CM | POA: Diagnosis not present

## 2023-11-11 DIAGNOSIS — M48061 Spinal stenosis, lumbar region without neurogenic claudication: Secondary | ICD-10-CM | POA: Diagnosis not present

## 2023-11-11 DIAGNOSIS — I129 Hypertensive chronic kidney disease with stage 1 through stage 4 chronic kidney disease, or unspecified chronic kidney disease: Secondary | ICD-10-CM | POA: Diagnosis not present

## 2023-11-11 DIAGNOSIS — E876 Hypokalemia: Secondary | ICD-10-CM | POA: Diagnosis not present

## 2023-11-11 DIAGNOSIS — Z86718 Personal history of other venous thrombosis and embolism: Secondary | ICD-10-CM | POA: Diagnosis not present

## 2023-11-11 DIAGNOSIS — I4891 Unspecified atrial fibrillation: Secondary | ICD-10-CM | POA: Diagnosis not present

## 2023-11-11 DIAGNOSIS — N182 Chronic kidney disease, stage 2 (mild): Secondary | ICD-10-CM | POA: Diagnosis not present

## 2023-11-11 DIAGNOSIS — Z7901 Long term (current) use of anticoagulants: Secondary | ICD-10-CM | POA: Diagnosis not present

## 2023-11-11 DIAGNOSIS — R799 Abnormal finding of blood chemistry, unspecified: Secondary | ICD-10-CM | POA: Diagnosis not present

## 2023-11-11 DIAGNOSIS — Z79899 Other long term (current) drug therapy: Secondary | ICD-10-CM | POA: Diagnosis not present

## 2023-11-11 DIAGNOSIS — G4733 Obstructive sleep apnea (adult) (pediatric): Secondary | ICD-10-CM | POA: Diagnosis not present

## 2023-11-11 DIAGNOSIS — K219 Gastro-esophageal reflux disease without esophagitis: Secondary | ICD-10-CM | POA: Diagnosis not present

## 2023-11-14 DIAGNOSIS — R531 Weakness: Secondary | ICD-10-CM | POA: Diagnosis not present

## 2023-11-14 DIAGNOSIS — G8929 Other chronic pain: Secondary | ICD-10-CM | POA: Diagnosis not present

## 2023-11-14 DIAGNOSIS — I5033 Acute on chronic diastolic (congestive) heart failure: Secondary | ICD-10-CM | POA: Diagnosis not present

## 2023-11-15 DIAGNOSIS — C9000 Multiple myeloma not having achieved remission: Secondary | ICD-10-CM | POA: Diagnosis not present

## 2023-11-15 DIAGNOSIS — G6289 Other specified polyneuropathies: Secondary | ICD-10-CM | POA: Diagnosis not present

## 2023-11-15 DIAGNOSIS — Z881 Allergy status to other antibiotic agents status: Secondary | ICD-10-CM | POA: Diagnosis not present

## 2023-11-15 DIAGNOSIS — I4891 Unspecified atrial fibrillation: Secondary | ICD-10-CM | POA: Diagnosis not present

## 2023-11-15 DIAGNOSIS — M61571 Other ossification of muscle, right ankle and foot: Secondary | ICD-10-CM | POA: Diagnosis not present

## 2023-11-15 DIAGNOSIS — M7731 Calcaneal spur, right foot: Secondary | ICD-10-CM | POA: Diagnosis not present

## 2023-11-15 DIAGNOSIS — Z7901 Long term (current) use of anticoagulants: Secondary | ICD-10-CM | POA: Diagnosis not present

## 2023-11-15 DIAGNOSIS — K219 Gastro-esophageal reflux disease without esophagitis: Secondary | ICD-10-CM | POA: Diagnosis not present

## 2023-11-15 DIAGNOSIS — G4733 Obstructive sleep apnea (adult) (pediatric): Secondary | ICD-10-CM | POA: Diagnosis not present

## 2023-11-15 DIAGNOSIS — Z79899 Other long term (current) drug therapy: Secondary | ICD-10-CM | POA: Diagnosis not present

## 2023-11-15 DIAGNOSIS — I5032 Chronic diastolic (congestive) heart failure: Secondary | ICD-10-CM | POA: Diagnosis not present

## 2023-11-15 DIAGNOSIS — G629 Polyneuropathy, unspecified: Secondary | ICD-10-CM | POA: Diagnosis not present

## 2023-11-15 DIAGNOSIS — I5022 Chronic systolic (congestive) heart failure: Secondary | ICD-10-CM | POA: Diagnosis not present

## 2023-11-15 DIAGNOSIS — R2990 Unspecified symptoms and signs involving the nervous system: Secondary | ICD-10-CM | POA: Diagnosis not present

## 2023-11-15 DIAGNOSIS — T45AX5A Adverse effect of immune checkpoint inhibitors and immunostimulant drugs, initial encounter: Secondary | ICD-10-CM | POA: Diagnosis not present

## 2023-11-15 DIAGNOSIS — Z96641 Presence of right artificial hip joint: Secondary | ICD-10-CM | POA: Diagnosis not present

## 2023-11-15 DIAGNOSIS — Z86718 Personal history of other venous thrombosis and embolism: Secondary | ICD-10-CM | POA: Diagnosis not present

## 2023-11-15 DIAGNOSIS — G610 Guillain-Barre syndrome: Secondary | ICD-10-CM | POA: Diagnosis not present

## 2023-11-15 DIAGNOSIS — R29898 Other symptoms and signs involving the musculoskeletal system: Secondary | ICD-10-CM | POA: Diagnosis not present

## 2023-11-15 DIAGNOSIS — I110 Hypertensive heart disease with heart failure: Secondary | ICD-10-CM | POA: Diagnosis not present

## 2023-11-15 DIAGNOSIS — Z92850 Personal history of chimeric antigen receptor t-cell therapy: Secondary | ICD-10-CM | POA: Diagnosis not present

## 2023-11-15 DIAGNOSIS — M1990 Unspecified osteoarthritis, unspecified site: Secondary | ICD-10-CM | POA: Diagnosis not present

## 2023-11-15 DIAGNOSIS — R531 Weakness: Secondary | ICD-10-CM | POA: Diagnosis not present

## 2023-11-16 DIAGNOSIS — G610 Guillain-Barre syndrome: Secondary | ICD-10-CM | POA: Diagnosis not present

## 2023-11-17 DIAGNOSIS — G610 Guillain-Barre syndrome: Secondary | ICD-10-CM | POA: Diagnosis not present

## 2023-11-21 DIAGNOSIS — G610 Guillain-Barre syndrome: Secondary | ICD-10-CM | POA: Diagnosis not present

## 2023-11-22 ENCOUNTER — Ambulatory Visit: Admitting: Physician Assistant

## 2023-11-22 DIAGNOSIS — G610 Guillain-Barre syndrome: Secondary | ICD-10-CM | POA: Diagnosis not present

## 2023-11-23 DIAGNOSIS — G610 Guillain-Barre syndrome: Secondary | ICD-10-CM | POA: Diagnosis not present

## 2023-11-24 DIAGNOSIS — G610 Guillain-Barre syndrome: Secondary | ICD-10-CM | POA: Diagnosis not present

## 2023-11-25 ENCOUNTER — Telehealth: Payer: Self-pay | Admitting: Hematology and Oncology

## 2023-11-25 DIAGNOSIS — G610 Guillain-Barre syndrome: Secondary | ICD-10-CM | POA: Diagnosis not present

## 2023-11-25 DIAGNOSIS — R278 Other lack of coordination: Secondary | ICD-10-CM | POA: Diagnosis not present

## 2023-11-25 DIAGNOSIS — Z818 Family history of other mental and behavioral disorders: Secondary | ICD-10-CM | POA: Diagnosis not present

## 2023-11-25 DIAGNOSIS — R2689 Other abnormalities of gait and mobility: Secondary | ICD-10-CM | POA: Diagnosis not present

## 2023-11-25 DIAGNOSIS — Z7901 Long term (current) use of anticoagulants: Secondary | ICD-10-CM | POA: Diagnosis not present

## 2023-11-25 DIAGNOSIS — G4733 Obstructive sleep apnea (adult) (pediatric): Secondary | ICD-10-CM | POA: Diagnosis not present

## 2023-11-25 DIAGNOSIS — D809 Immunodeficiency with predominantly antibody defects, unspecified: Secondary | ICD-10-CM | POA: Diagnosis not present

## 2023-11-25 DIAGNOSIS — G629 Polyneuropathy, unspecified: Secondary | ICD-10-CM | POA: Diagnosis not present

## 2023-11-25 DIAGNOSIS — M533 Sacrococcygeal disorders, not elsewhere classified: Secondary | ICD-10-CM | POA: Diagnosis not present

## 2023-11-25 DIAGNOSIS — T451X5A Adverse effect of antineoplastic and immunosuppressive drugs, initial encounter: Secondary | ICD-10-CM | POA: Diagnosis not present

## 2023-11-25 DIAGNOSIS — M5126 Other intervertebral disc displacement, lumbar region: Secondary | ICD-10-CM | POA: Diagnosis not present

## 2023-11-25 DIAGNOSIS — I4811 Longstanding persistent atrial fibrillation: Secondary | ICD-10-CM | POA: Diagnosis not present

## 2023-11-25 DIAGNOSIS — R1310 Dysphagia, unspecified: Secondary | ICD-10-CM | POA: Diagnosis not present

## 2023-11-25 DIAGNOSIS — E559 Vitamin D deficiency, unspecified: Secondary | ICD-10-CM | POA: Diagnosis not present

## 2023-11-25 DIAGNOSIS — Z823 Family history of stroke: Secondary | ICD-10-CM | POA: Diagnosis not present

## 2023-11-25 DIAGNOSIS — Z743 Need for continuous supervision: Secondary | ICD-10-CM | POA: Diagnosis not present

## 2023-11-25 DIAGNOSIS — Z881 Allergy status to other antibiotic agents status: Secondary | ICD-10-CM | POA: Diagnosis not present

## 2023-11-25 DIAGNOSIS — R531 Weakness: Secondary | ICD-10-CM | POA: Diagnosis not present

## 2023-11-25 DIAGNOSIS — M48061 Spinal stenosis, lumbar region without neurogenic claudication: Secondary | ICD-10-CM | POA: Diagnosis not present

## 2023-11-25 DIAGNOSIS — Z79624 Long term (current) use of inhibitors of nucleotide synthesis: Secondary | ICD-10-CM | POA: Diagnosis not present

## 2023-11-25 DIAGNOSIS — C9000 Multiple myeloma not having achieved remission: Secondary | ICD-10-CM | POA: Diagnosis present

## 2023-11-25 DIAGNOSIS — D801 Nonfamilial hypogammaglobulinemia: Secondary | ICD-10-CM | POA: Diagnosis not present

## 2023-11-25 DIAGNOSIS — M7989 Other specified soft tissue disorders: Secondary | ICD-10-CM | POA: Diagnosis not present

## 2023-11-25 DIAGNOSIS — R29898 Other symptoms and signs involving the musculoskeletal system: Secondary | ICD-10-CM | POA: Diagnosis not present

## 2023-11-25 DIAGNOSIS — I10 Essential (primary) hypertension: Secondary | ICD-10-CM | POA: Diagnosis not present

## 2023-11-25 DIAGNOSIS — Z86718 Personal history of other venous thrombosis and embolism: Secondary | ICD-10-CM | POA: Diagnosis not present

## 2023-11-25 DIAGNOSIS — E538 Deficiency of other specified B group vitamins: Secondary | ICD-10-CM | POA: Diagnosis not present

## 2023-11-25 DIAGNOSIS — I480 Paroxysmal atrial fibrillation: Secondary | ICD-10-CM | POA: Diagnosis not present

## 2023-11-25 DIAGNOSIS — D709 Neutropenia, unspecified: Secondary | ICD-10-CM | POA: Diagnosis not present

## 2023-11-25 DIAGNOSIS — M6281 Muscle weakness (generalized): Secondary | ICD-10-CM | POA: Diagnosis not present

## 2023-11-25 DIAGNOSIS — Z7961 Long term (current) use of immunomodulator: Secondary | ICD-10-CM | POA: Diagnosis not present

## 2023-11-25 DIAGNOSIS — G620 Drug-induced polyneuropathy: Secondary | ICD-10-CM | POA: Diagnosis not present

## 2023-11-25 DIAGNOSIS — I4891 Unspecified atrial fibrillation: Secondary | ICD-10-CM | POA: Diagnosis not present

## 2023-11-25 DIAGNOSIS — E876 Hypokalemia: Secondary | ICD-10-CM | POA: Diagnosis not present

## 2023-11-25 DIAGNOSIS — Z8249 Family history of ischemic heart disease and other diseases of the circulatory system: Secondary | ICD-10-CM | POA: Diagnosis not present

## 2023-11-25 DIAGNOSIS — Z9089 Acquired absence of other organs: Secondary | ICD-10-CM | POA: Diagnosis not present

## 2023-11-25 DIAGNOSIS — R5081 Fever presenting with conditions classified elsewhere: Secondary | ICD-10-CM | POA: Diagnosis not present

## 2023-11-25 DIAGNOSIS — G894 Chronic pain syndrome: Secondary | ICD-10-CM | POA: Diagnosis not present

## 2023-11-25 DIAGNOSIS — I509 Heart failure, unspecified: Secondary | ICD-10-CM | POA: Diagnosis not present

## 2023-11-25 DIAGNOSIS — D472 Monoclonal gammopathy: Secondary | ICD-10-CM | POA: Diagnosis not present

## 2023-11-28 ENCOUNTER — Telehealth: Payer: Self-pay

## 2023-11-28 DIAGNOSIS — I4891 Unspecified atrial fibrillation: Secondary | ICD-10-CM | POA: Diagnosis not present

## 2023-11-28 DIAGNOSIS — R29898 Other symptoms and signs involving the musculoskeletal system: Secondary | ICD-10-CM | POA: Diagnosis not present

## 2023-11-28 DIAGNOSIS — I10 Essential (primary) hypertension: Secondary | ICD-10-CM | POA: Diagnosis not present

## 2023-11-28 DIAGNOSIS — E559 Vitamin D deficiency, unspecified: Secondary | ICD-10-CM | POA: Diagnosis not present

## 2023-11-28 DIAGNOSIS — G894 Chronic pain syndrome: Secondary | ICD-10-CM | POA: Diagnosis not present

## 2023-11-28 DIAGNOSIS — C9002 Multiple myeloma in relapse: Secondary | ICD-10-CM

## 2023-11-28 DIAGNOSIS — E538 Deficiency of other specified B group vitamins: Secondary | ICD-10-CM | POA: Diagnosis not present

## 2023-12-01 DIAGNOSIS — G610 Guillain-Barre syndrome: Secondary | ICD-10-CM | POA: Diagnosis not present

## 2023-12-01 DIAGNOSIS — T451X5A Adverse effect of antineoplastic and immunosuppressive drugs, initial encounter: Secondary | ICD-10-CM | POA: Diagnosis not present

## 2023-12-01 DIAGNOSIS — G620 Drug-induced polyneuropathy: Secondary | ICD-10-CM | POA: Diagnosis not present

## 2023-12-01 DIAGNOSIS — E559 Vitamin D deficiency, unspecified: Secondary | ICD-10-CM | POA: Diagnosis not present

## 2023-12-01 DIAGNOSIS — G4733 Obstructive sleep apnea (adult) (pediatric): Secondary | ICD-10-CM | POA: Diagnosis not present

## 2023-12-01 DIAGNOSIS — C9000 Multiple myeloma not having achieved remission: Secondary | ICD-10-CM | POA: Diagnosis not present

## 2023-12-01 DIAGNOSIS — E538 Deficiency of other specified B group vitamins: Secondary | ICD-10-CM | POA: Diagnosis not present

## 2023-12-01 DIAGNOSIS — I509 Heart failure, unspecified: Secondary | ICD-10-CM | POA: Diagnosis not present

## 2023-12-02 ENCOUNTER — Encounter (HOSPITAL_COMMUNITY): Payer: Self-pay

## 2023-12-02 ENCOUNTER — Other Ambulatory Visit: Payer: Self-pay | Admitting: *Deleted

## 2023-12-02 ENCOUNTER — Other Ambulatory Visit: Payer: Self-pay | Admitting: Hematology and Oncology

## 2023-12-02 ENCOUNTER — Ambulatory Visit (HOSPITAL_COMMUNITY)
Admission: RE | Admit: 2023-12-02 | Discharge: 2023-12-02 | Disposition: A | Discharge status: Home / Self Care | Source: Ambulatory Visit | Attending: Hematology and Oncology | Admitting: Hematology and Oncology

## 2023-12-02 ENCOUNTER — Inpatient Hospital Stay: Attending: Physician Assistant

## 2023-12-02 ENCOUNTER — Inpatient Hospital Stay (HOSPITAL_BASED_OUTPATIENT_CLINIC_OR_DEPARTMENT_OTHER): Admitting: Hematology and Oncology

## 2023-12-02 VITALS — BP 152/85 | HR 63 | Temp 97.9°F | Resp 13 | Wt 241.0 lb

## 2023-12-02 DIAGNOSIS — E876 Hypokalemia: Secondary | ICD-10-CM | POA: Diagnosis not present

## 2023-12-02 DIAGNOSIS — I509 Heart failure, unspecified: Secondary | ICD-10-CM | POA: Diagnosis not present

## 2023-12-02 DIAGNOSIS — I480 Paroxysmal atrial fibrillation: Secondary | ICD-10-CM | POA: Insufficient documentation

## 2023-12-02 DIAGNOSIS — M7989 Other specified soft tissue disorders: Secondary | ICD-10-CM | POA: Diagnosis not present

## 2023-12-02 DIAGNOSIS — Z9089 Acquired absence of other organs: Secondary | ICD-10-CM | POA: Diagnosis not present

## 2023-12-02 DIAGNOSIS — R531 Weakness: Secondary | ICD-10-CM | POA: Insufficient documentation

## 2023-12-02 DIAGNOSIS — G610 Guillain-Barre syndrome: Secondary | ICD-10-CM

## 2023-12-02 DIAGNOSIS — C9000 Multiple myeloma not having achieved remission: Secondary | ICD-10-CM

## 2023-12-02 DIAGNOSIS — Z881 Allergy status to other antibiotic agents status: Secondary | ICD-10-CM | POA: Insufficient documentation

## 2023-12-02 DIAGNOSIS — M533 Sacrococcygeal disorders, not elsewhere classified: Secondary | ICD-10-CM | POA: Diagnosis not present

## 2023-12-02 DIAGNOSIS — G4733 Obstructive sleep apnea (adult) (pediatric): Secondary | ICD-10-CM | POA: Insufficient documentation

## 2023-12-02 DIAGNOSIS — Z7961 Long term (current) use of immunomodulator: Secondary | ICD-10-CM | POA: Diagnosis not present

## 2023-12-02 DIAGNOSIS — R5381 Other malaise: Secondary | ICD-10-CM | POA: Diagnosis not present

## 2023-12-02 DIAGNOSIS — Z7901 Long term (current) use of anticoagulants: Secondary | ICD-10-CM | POA: Diagnosis not present

## 2023-12-02 DIAGNOSIS — G894 Chronic pain syndrome: Secondary | ICD-10-CM | POA: Diagnosis not present

## 2023-12-02 DIAGNOSIS — Z86718 Personal history of other venous thrombosis and embolism: Secondary | ICD-10-CM | POA: Diagnosis not present

## 2023-12-02 DIAGNOSIS — M5126 Other intervertebral disc displacement, lumbar region: Secondary | ICD-10-CM | POA: Insufficient documentation

## 2023-12-02 DIAGNOSIS — G4700 Insomnia, unspecified: Secondary | ICD-10-CM | POA: Diagnosis not present

## 2023-12-02 DIAGNOSIS — Z79624 Long term (current) use of inhibitors of nucleotide synthesis: Secondary | ICD-10-CM | POA: Insufficient documentation

## 2023-12-02 DIAGNOSIS — Z823 Family history of stroke: Secondary | ICD-10-CM | POA: Diagnosis not present

## 2023-12-02 DIAGNOSIS — Z79899 Other long term (current) drug therapy: Secondary | ICD-10-CM | POA: Insufficient documentation

## 2023-12-02 DIAGNOSIS — M48061 Spinal stenosis, lumbar region without neurogenic claudication: Secondary | ICD-10-CM | POA: Diagnosis not present

## 2023-12-02 DIAGNOSIS — I4891 Unspecified atrial fibrillation: Secondary | ICD-10-CM | POA: Diagnosis not present

## 2023-12-02 DIAGNOSIS — Z818 Family history of other mental and behavioral disorders: Secondary | ICD-10-CM | POA: Insufficient documentation

## 2023-12-02 DIAGNOSIS — Z8249 Family history of ischemic heart disease and other diseases of the circulatory system: Secondary | ICD-10-CM | POA: Insufficient documentation

## 2023-12-02 DIAGNOSIS — I10 Essential (primary) hypertension: Secondary | ICD-10-CM | POA: Diagnosis not present

## 2023-12-02 DIAGNOSIS — R1310 Dysphagia, unspecified: Secondary | ICD-10-CM | POA: Diagnosis not present

## 2023-12-02 LAB — CBC WITH DIFFERENTIAL (CANCER CENTER ONLY)
Abs Immature Granulocytes: 0.01 K/uL (ref 0.00–0.07)
Basophils Absolute: 0 K/uL (ref 0.0–0.1)
Basophils Relative: 1 %
Eosinophils Absolute: 0 K/uL (ref 0.0–0.5)
Eosinophils Relative: 0 %
HCT: 35.9 % — ABNORMAL LOW (ref 39.0–52.0)
Hemoglobin: 12.2 g/dL — ABNORMAL LOW (ref 13.0–17.0)
Immature Granulocytes: 0 %
Lymphocytes Relative: 7 %
Lymphs Abs: 0.4 K/uL — ABNORMAL LOW (ref 0.7–4.0)
MCH: 32.5 pg (ref 26.0–34.0)
MCHC: 34 g/dL (ref 30.0–36.0)
MCV: 95.7 fL (ref 80.0–100.0)
Monocytes Absolute: 0.7 K/uL (ref 0.1–1.0)
Monocytes Relative: 11 %
Neutro Abs: 4.8 K/uL (ref 1.7–7.7)
Neutrophils Relative %: 81 %
Platelet Count: 138 K/uL — ABNORMAL LOW (ref 150–400)
RBC: 3.75 MIL/uL — ABNORMAL LOW (ref 4.22–5.81)
RDW: 14.1 % (ref 11.5–15.5)
WBC Count: 5.9 K/uL (ref 4.0–10.5)
nRBC: 0 % (ref 0.0–0.2)

## 2023-12-02 LAB — CMP (CANCER CENTER ONLY)
ALT: 17 U/L (ref 0–44)
AST: 20 U/L (ref 15–41)
Albumin: 3.7 g/dL (ref 3.5–5.0)
Alkaline Phosphatase: 78 U/L (ref 38–126)
Anion gap: 6 (ref 5–15)
BUN: 12 mg/dL (ref 8–23)
CO2: 27 mmol/L (ref 22–32)
Calcium: 8.8 mg/dL — ABNORMAL LOW (ref 8.9–10.3)
Chloride: 107 mmol/L (ref 98–111)
Creatinine: 0.71 mg/dL (ref 0.61–1.24)
GFR, Estimated: >60 mL/min (ref >60)
Glucose, Bld: 99 mg/dL (ref 70–99)
Potassium: 3.3 mmol/L — ABNORMAL LOW (ref 3.5–5.1)
Sodium: 140 mmol/L (ref 135–145)
Total Bilirubin: 0.3 mg/dL (ref 0.0–1.2)
Total Protein: 6.9 g/dL (ref 6.5–8.1)

## 2023-12-02 LAB — LACTATE DEHYDROGENASE: LDH: 164 U/L (ref 98–192)

## 2023-12-02 NOTE — Progress Notes (Signed)
 Transported via w/c to Radiology for US  doppler of bilateral lower extremeities due to imcreased swelling and pain in right leg. Wife is with patient.

## 2023-12-02 NOTE — Progress Notes (Signed)
 [facility] CSW Progress Note  Clinical Social Worker assisted patient's nurse with scheduling urgent transportation. [facility] scheduled at 1:20, called [phone]. Patient / spouse notified.   Follow Up Plan:  CSW has referral for patient, will attempt to reach next week.   Lizbeth Sprague, LCSW Clinical Social Worker [facility]

## 2023-12-02 NOTE — Progress Notes (Signed)
 [facility] [phone]   [phone]  PROGRESS NOTE  Patient Care Team: Charlott Dorn LABOR, MD as PCP - General (Internal Medicine) Okey Vina GAILS, MD as PCP - Cardiology (Cardiology) Shlomo Wilbert SAUNDERS, MD as PCP - Sleep Medicine (Cardiology) Cindie Ole DASEN, MD as PCP - Electrophysiology (Cardiology) Sheldon Standing, MD as Consulting Physician (General Surgery) Rosalie Kitchens, MD as Consulting Physician (Gastroenterology) Shlomo Wilbert SAUNDERS, MD as Consulting Physician (Sleep Medicine) Tobie Tonita POUR, DO as Consulting Physician (Neurology)  Hematological/Oncological History # IgA Kappa Multiple Myeloma 05/05/2020: Bone marrow biopsy showed increased number of plasma cells representing 14% of all cells in the aspirate associated with numerous variably sized clusters in the clot and biopsy sections 04/10/2021: M protein 0.3, IgA kappa specificity. Kappa 580.5, Labda 7.0, ratio 82.93.   12/07/2021: Labs show of 1971.3, lambda 5.5, ratio 358.42 12/10/2021: Transition care to Dr. Federico. 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  01/26/2022: Cycle 2 Day 1 of Dara/Rev/Dex 02/22/2022: Cycle 3 Day 1 of Dara/Rev/Dex 03/23/2022: Cycle 4 Day 1 of Dara/Rev/Dex 04/20/2022: Cycle 5 Day 1 of Dara/Rev/Dex 05/18/2022: Cycle 6 Day 1 of Dara/Rev/Dex 05/27/2022: MRI cervical/thoracic/lumbar: pathologic fracture at T2 with significant osseous retropulsion and resulting cord compression. Additional smaller lesions within T1 vertebral body, right C7 and T3 articulating facets.  Small Multiple Myeloma lesions scattered at all lumbar levels and in the visible left sacral ala. Largest lumbar tumor is 2.3 cm in the left L1 posterior elements and pedicle. No lumbar extraosseous or epidural tumor extension, or pathologic fracture.Underlying chronic lumbar spine  degeneration, with a new central disc herniation at L3-L4 since 2021 now resulting in mild to moderate degenerative spinal stenosis there. 05/28/2022-06/09/2022: Admitted for T2 pathologic fracture and cord compression. He received urgent radiation therapy for a total of 3.0 Gy. He underwent  thoracic 1 through 4 posterior lateral arthrodesis/fusion and 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  06/23/2022: Cycle 1 Day 1 of Velcade /Pomalyst /Dex 08/04/2022: Cycle 2 Day 1 of Velcade /Pomalyst /Dex. Pomalyst  to be decreased to 2mg  PO daily due to cytopenias.  09/01/2022: Cycle 3 Day 1 of Velcade /Pomalyst /Dex. (Missed 2nd dose of cycle due to hospitalization)  10/06/2022: Cycle 4 Day 1 of Velcade /Pomalyst /Dex.  10/27/2022: Cycle 5 Day 1 of Velcade /Pomalyst /Dex.  12/02/2022: Cycle 6 Day 1 of Velcade /Pomalyst /Dex. 01/12/2023: Cycle 1 Day 1 of Kyprolis /Dex 02/09/2023: Cycle 2 Day 1 of Kyprolis /Dex   04/07/2023: Transitioned to selinexor  and Pomalyst  per request of transplant team due to progression on Kyprolis  and dexamethasone  06/27/2023: start of Carvykti.  Complicated by Guillain-Barr  Interval History:  Elsie CHRISTELLA Berry 73 y.o. male with medical history significant for IgA Kappa Multiple Myeloma who presents for a follow up visit. He was last seen on 05/25/2023. In the interim, he completed Carvykti  On exam today Mr. Ratajczak reports he has had a very rough time in the room since her last visit.  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  He reports that his improvement has been glacial.  He reports that he is doing his best to try to build up his strength but continues to have severe weakness from his hip down.  He notes that he is doing his best to try to improve his upper body strength.  He also notes that his spine has been sore.  He is having swelling in his right leg  more so than his left leg.  He notes that he had been receiving IVIG therapy at [facility]  and there was consideration at 1 point of a plasma exchange.  He notes that his weight has been decreasing as the food at his facility is not good.  He reports that it is challenging for him to eat a good meal at his current facility.  He is currently interested in switching to another facility.  He otherwise denies any current fevers, chills, sweats, nausea, vomiting or diarrhea.  A full 10 point ROS is otherwise negative.   MEDICAL HISTORY:  Past Medical History:  Diagnosis Date   A-fib [facility])    Arthritis    comes and goes; mostly in hands, occasionally elbow (04/05/2017)   Complication of anesthesia    just wanted to sleep alot  for couple days S/P VARICOCELE OR (04/05/2017)   DVT (deep venous thrombosis) (HCC)    LLE   Dysrhythmia    PVCs    GERD (gastroesophageal reflux disease)    History of hiatal hernia    History of radiation therapy    Thoracic Spine- 07/13/22-07/23/22-Dr. Lynwood Nasuti   Hypertension    Impaired glucose tolerance    Multiple myeloma (HCC)    Obesity (BMI 30-39.9) 07/26/2016   OSA on CPAP 07/26/2016   Mild with AHI 12.5/hr now on CPAP at 14cm H2O   PAF (paroxysmal atrial fibrillation) (HCC)    s/p PVI Ablation in 2018 // Flecainide  Rx // Echo 8/21: EF 60-65, no RWMA, mild LVH, normal RV SF, moderate RVE, mild BAE, trivial MR, mild dilation of aortic root (40 mm)   Pneumonia    may have had walking pneumonia a few years ago (04/05/2017)   Pre-diabetes    Thrombophlebitis     SURGICAL HISTORY: Past Surgical History:  Procedure Laterality Date   ATRIAL FIBRILLATION ABLATION  04/05/2017   ATRIAL FIBRILLATION ABLATION N/A 04/05/2017   Procedure: ATRIAL FIBRILLATION ABLATION;  Surgeon: Kelsie Lynwood, MD;  Location: [facility];  Service: Cardiovascular;  Laterality: N/A;   BONE BIOPSY Right 12/17/2021   Procedure: BONE BIOPSY;  Surgeon: Cristy Bonner DASEN, MD;  Location: [facility];  Service: Orthopedics;  Laterality: Right;   COMPLETE  RIGHT HIP REPLACMENT  01/19/2019   EVALUATION UNDER ANESTHESIA WITH HEMORRHOIDECTOMY N/A 02/24/2017   Procedure: EXAM UNDER ANESTHESIA WITH  HEMORRHOIDECTOMY;  Surgeon: Sheldon Standing, MD;  Location: [facility];  Service: General;  Laterality: N/A;   EXCISIONAL TOTAL HIP ARTHROPLASTY WITH ANTIBIOTIC SPACERS Right 09/25/2020   Procedure: EXCISIONAL TOTAL HIP ARTHROPLASTY WITH ANTIBIOTIC SPACERS;  Surgeon: Ernie Cough, MD;  Location: [facility];  Service: Orthopedics;  Laterality: Right;   FLEXIBLE SIGMOIDOSCOPY N/A 04/15/2015   Procedure:  UNSEDATED FLEXIBLE SIGMOIDOSCOPY;  Surgeon: Gladis MARLA Louder, MD;  Location: [facility];  Service: Endoscopy;  Laterality: N/A;   HUMERUS IM NAIL Right 12/17/2021   Procedure: INTRAMEDULLARY (IM) NAIL HUMERAL;  Surgeon: Cristy Bonner DASEN, MD;  Location: [facility];  Service: Orthopedics;  Laterality: Right;   KNEE ARTHROSCOPY Left ~ 2016   POSTERIOR CERVICAL FUSION/FORAMINOTOMY N/A 06/04/2022   Procedure: Thoracic one - Thoracic Four Posterior lateral arthrodesis/ fusion and Thoracic two Corpectomy;  Surgeon: Gillie Duncans, MD;  Location: [facility];  Service: Neurosurgery;  Laterality: N/A;   REIMPLANTATION OF TOTAL HIP Right 12/25/2020   Procedure: REIMPLANTATION/REVISION OF RIGHT TOTAL HIP VERSUS REPEAT IRRIGATION AND DEBRIDEMENT;  Surgeon: Ernie Cough, MD;  Location: [facility];  Service: Orthopedics;  Laterality: Right;   TESTICLE SURGERY  1988   VARICOCELE   TONSILLECTOMY     VARICOSE VEIN SURGERY Bilateral     SOCIAL HISTORY: Social History   Socioeconomic History   Marital status: Married    Spouse name: Not on file   Number of children: 1   Years of education: law school   Highest education level: Not on file  Occupational History   Occupation: lawyer  Tobacco Use   Smoking status: Never   Smokeless tobacco: Never  Vaping Use   Vaping status: Never Used  Substance and Sexual Activity   Alcohol use: Yes    Alcohol/week: 2.0 standard drinks of  alcohol    Types: 2 Glasses of wine per week    Comment: 2 glasses of wine a week, occ bourbon, beer   Drug use: No   Sexual activity: Not Currently  Other Topics Concern   Not on file  Social History Narrative   Lives in [place] with spouse in a 2 story home.  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 1 daughter.     Education: Social worker school.    Social Drivers of Corporate investment banker Strain: High Risk (11/15/2023)   Received from [facility]   Overall Financial Resource Strain (CARDIA)    Difficulty of Paying Living Expenses: Very hard  Food Insecurity: No Food Insecurity (11/15/2023)   Received from [facility]   Hunger Vital Sign    Within the past 12 months, you worried that your food would run out before you got the money to buy more.: Never true    Within the past 12 months, the food you bought just didn't last and you didn't have money to get more.: Never true  Transportation Needs: No Transportation Needs (11/15/2023)   [facility] from [facility] - Transportation    In the past 12 months, has lack of transportation kept you from medical appointments or from getting medications?: No    Lack of Transportation (Non-Medical): No  Physical Activity: Not on file  Stress: Not on file  Social Connections: Unknown (09/15/2021)   Received from [facility]   Social Network    Social Network: Not on file  Intimate Partner Violence: Not At Risk (09/27/2022)   Humiliation, Afraid, Rape, and Kick questionnaire    Fear of Current or Ex-Partner: No    Emotionally Abused: No    Physically Abused: No    Sexually Abused: No    FAMILY HISTORY: Family History  Problem Relation Age of Onset   Dementia Mother    Stroke Father    Atrial fibrillation Father    Hypertension Father    Hypertension Paternal Grandfather    Dementia Maternal Grandmother     ALLERGIES:  is allergic to octagam [immune  globulin], linezolid , and vancomycin .  MEDICATIONS:  Current Outpatient Medications  Medication Sig Dispense Refill   HYDROmorphone  (DILAUDID ) 2 MG tablet Take 2 mg by mouth every 4 (four) hours as needed for severe pain (pain score 7-10).     acyclovir  (ZOVIRAX ) 400 MG tablet Take 1 tablet (400 mg total) by mouth 2 (two) times daily. 60 tablet 5   albuterol  (VENTOLIN  HFA) 108 (90 Base) MCG/ACT inhaler Inhale 2 puffs into the lungs every 6 (six) hours as needed for wheezing or shortness of breath. (Patient not taking: Reported on 12/02/2023) 8 g 0   amiodarone  (PACERONE ) 200 MG tablet  Take 1 tablet (200 mg total) by mouth daily. 90 tablet 2   calcium  carbonate (TUMS - DOSED IN MG ELEMENTAL CALCIUM ) 500 MG chewable tablet Chew 1 tablet by mouth daily.     cholecalciferol  (VITAMIN D3) 25 MCG (1000 UNIT) tablet Take 1,000 Units by mouth daily.     Cyanocobalamin  (VITAMIN B-12 PO) Take 1,000 mcg by mouth daily.     docusate sodium  (COLACE) 100 MG capsule Take 1 capsule (100 mg total) by mouth every 12 (twelve) hours. 60 capsule 0   ELIQUIS  5 MG TABS tablet TAKE ONE TABLET BY MOUTH TWICE A DAY 60 tablet 6   fluticasone  (FLONASE ) 50 MCG/ACT nasal spray Place 1 spray into both nostrils daily as needed for allergies or rhinitis.     gabapentin  (NEURONTIN ) 300 MG capsule TAKE TWO CAPSULES BY MOUTH TWICE A DAY 360 capsule 1   hydrALAZINE  (APRESOLINE ) 100 MG tablet Take 1 tablet (100 mg total) by mouth 3 (three) times daily. 270 tablet 3   ketoconazole  (NIZORAL ) 2 % cream as needed for irritation.     levETIRAcetam  (KEPPRA ) 500 MG tablet Take 500 mg by mouth 2 (two) times daily.     metolazone  (ZAROXOLYN ) 2.5 MG tablet Take 1 tablet (2.5 mg total) by mouth every other day. (Patient not taking: Reported on 12/02/2023)     polyethylene glycol (MIRALAX ) 17 g packet Take 17 g by mouth daily. 14 each 0   polyethylene glycol powder (GLYCOLAX /MIRALAX ) 17 GM/SCOOP powder Take 1 g by mouth daily.     potassium  chloride SA (KLOR-CON  M) 20 MEQ tablet TAKE TWO TABLETS BY MOUTH ONE TIME DAILY . THEN TAKE AN EXTRA TABLET ON THE DAY YOU TAKE THE METOLAZONE  180 tablet 3   psyllium (METAMUCIL) 58.6 % powder Take by mouth at bedtime. 3 tsp daily hs     sacubitril -valsartan  (ENTRESTO ) 49-51 MG Take 1 tablet by mouth 2 (two) times daily.     sodium chloride  (OCEAN) 0.65 % SOLN nasal spray Place 1 spray into both nostrils as needed for congestion.     spironolactone  (ALDACTONE ) 25 MG tablet Take 1 tablet (25 mg total) by mouth daily.     sulfamethoxazole -trimethoprim  (BACTRIM  DS) 800-160 MG tablet Take 1 tablet by mouth 3 (three) times a week.     torsemide  (DEMADEX ) 20 MG tablet Take 2 tablets (40 mg total) by mouth daily. 90 tablet 3   No current facility-administered medications for this visit.    REVIEW OF SYSTEMS:   Constitutional: ( - ) fevers, ( - )  chills , ( - ) night sweats Eyes: ( - ) blurriness of vision, ( - ) double vision, ( - ) watery eyes Ears, nose, mouth, throat, and face: ( - ) mucositis, ( - ) sore throat Respiratory: ( - ) cough, ( - ) dyspnea, ( - ) wheezes Cardiovascular: ( - ) palpitation, ( - ) chest discomfort, ( - ) lower extremity swelling Gastrointestinal:  ( - ) nausea, ( - ) heartburn, ( - ) change in bowel habits Skin: ( - ) abnormal skin rashes Lymphatics: ( - ) new lymphadenopathy, ( - ) easy bruising Neurological: ( - ) numbness, ( - ) tingling, ( - ) new weaknesses Behavioral/Psych: ( - ) mood change, ( - ) new changes  All other systems were reviewed with the patient and are negative.  PHYSICAL EXAMINATION: ECOG PERFORMANCE STATUS: 1 - Symptomatic but completely ambulatory  Vitals:   12/02/23 1035  BP: (!) 152/85  Pulse: 63  Resp: 13  Temp: 97.9 F (36.6 C)  SpO2: 99%        Filed Weights   12/02/23 1035  Weight: 241 lb (109.3 kg)     GENERAL: Well-appearing elderly Caucasian male, alert, no distress and comfortable SKIN: no overt abnormalities of  the skin.  EYES: conjunctiva are pink and non-injected, sclera clear LUNGS: clear to auscultation and percussion with normal breathing effort HEART: tachycardic &  no murmurs. +bilateral trace lower extremity edema Musculoskeletal: no cyanosis of digits and no clubbing  PSYCH: alert & oriented x 3, fluent speech NEURO: no focal motor/sensory deficits  LABORATORY DATA:  I have reviewed the data as listed    Latest Ref Rng & Units 12/02/2023   10:05 AM 09/27/2023    3:20 AM 09/22/2023   10:33 AM  CBC  WBC 4.0 - 10.5 K/uL 5.9  10.2  8.1   Hemoglobin 13.0 - 17.0 g/dL 87.7  87.9  87.9   Hematocrit 39.0 - 52.0 % 35.9  36.9  37.2   Platelets 150 - 400 K/uL 138  189  146        Latest Ref Rng & Units 12/02/2023   10:05 AM 09/27/2023    3:20 AM 09/22/2023   10:33 AM  CMP  Glucose 70 - 99 mg/dL 99  894  91   BUN 8 - 23 mg/dL 12  23  30    Creatinine 0.61 - 1.24 mg/dL 9.28  9.18  9.12   Sodium 135 - 145 mmol/L 140  139  139   Potassium 3.5 - 5.1 mmol/L 3.3  3.7  3.6   Chloride 98 - 111 mmol/L 107  105  106   CO2 22 - 32 mmol/L 27  23  22    Calcium  8.9 - 10.3 mg/dL 8.8  8.9  9.1   Total Protein 6.5 - 8.1 g/dL 6.9  6.3    Total Bilirubin 0.0 - 1.2 mg/dL 0.3  0.4    Alkaline Phos 38 - 126 U/L 78  87    AST 15 - 41 U/L 20  32    ALT 0 - 44 U/L 17  38      Lab Results  Component Value Date   MPROTEIN Not Observed 06/08/2023   MPROTEIN Not Observed 05/11/2023   MPROTEIN Not Observed 04/20/2023   Lab Results  Component Value Date   KPAFRELGTCHN 221.7 (H) 06/08/2023   KPAFRELGTCHN 212.7 (H) 05/11/2023   KPAFRELGTCHN 390.9 (H) 04/20/2023   LAMBDASER 4.7 (L) 06/08/2023   LAMBDASER 4.4 (L) 05/11/2023   LAMBDASER 8.3 04/20/2023   KAPLAMBRATIO 47.17 (H) 06/08/2023   KAPLAMBRATIO 48.34 (H) 05/11/2023   KAPLAMBRATIO 47.10 (H) 04/20/2023     RADIOGRAPHIC STUDIES: No results found.   ASSESSMENT & PLAN JAYLEN KNOPE is a 73 y.o. male with medical history significant for IgA Kappa  Multiple Myeloma who presents for a follow up visit.   # IgA Kappa Multiple Myeloma --Metastatic survey completed 12/15/2021 --Patient underwent fixation of his humerus on 12/17/2021, pathology confirmed plasmacytoma/plasma cell neoplasm.  --Received 6 cycles of Darzalex , Revlimid , and dexamethasone  started on 12/28/2021-05/18/2022.  --Due to worsening myeloma osseous lesions including pathologic fracture at T2 with cord compression, Dr. Federico recommends changing therapy to Velcade /Pomalyst /Dex.  --Due to rising kappa free light chains, recommend to change therapy to Carfilzomib /Dex started on 01/12/2023. Plan: --s/p CAR-T at [facility].  This occurred 06/27/2023 --labs from today showed white blood cell 5.9, hemoglobin 12.2, MCV 95.7,  platelets 138 -- Based on last labs at [facility] patient was in a complete remission.  Will continue multiple myeloma labs every 4 weeks -- Will plan to administer IVIG if the patient has an IgG level of less than 400 --RTC in 4 weeks for labs and clinic visits.  Once stable will proceed with every 4 week labs and every 12 week clinic visits.   # Neck pain and mid back pain: --Suspect pain is exacerbated from recent hospitalization and being in bed/sitting for long periods.  --MRI C/T/L spine obtinaed that showed unchanged lytic lesions in the right C7 inferior articular process,T6, T8, and T10  vertebral bodies, as well  as the T11 spinous process. Unchanged  multilevel cervical spondylsis, Interval enlargement of disc herniation at L3-L4 with progressive spinal canal stenosis. No new or progressive lytic lesions.  --Encouraged to continue with physical therapy and follow up with ortho.   #Palliative Radiation Therapy: # Dysphagia --Received palliative radiation from 01/06/2022-01/19/2022 to right hymerus, right forearm and right chest/rib, 20 Gy in 10 Fx.  --recieved palliative radiation to surgical bed in the upper thoracic spine area  on 07/13/2022.  27 Gy in 9 Fx.   #Lower extremity edema--improved: --Currently on lasix  and torsemide   # Rash Concerning for Shingles-resolved -- Prescribed valacyclovir  3 times daily 1000 mg x 10 days-completed the course -- We will continue to monitor rash closely.  #DVT: --Found to have acute DVT involving left peroneal veins. Likely secondary to recent surgery and hospitalization --Currently on eliquis  5 mg BID.    #Hypokalemia: --Potassium level is 3.8 today. Advised to continue with 40 mEq twice daily and increase to 60 mEq on the days he takes torsemide /spironolactone   #Rash on scalp and right forearm: --Recommend topical hydrocortisone cream for spot treatment  #Supportive Care -- chemotherapy education complete -- port placement not required -- zofran  8mg  q8H PRN and compazine  10mg  PO q6H for nausea -- acyclovir  400mg  PO BID for VCZ prophylaxis -- Will plan to restart Zometa  therapy as soon as is feasible  No orders of the defined types were placed in this encounter.  All questions were answered. The patient knows to call the clinic with any problems, questions or concerns.  A total of more than 30 minutes were spent on this encounter with face-to-face time and non-face-to-face time, including preparing to see the patient, ordering tests and/or medications, counseling the patient and coordination of care as outlined above.   Norleen IVAR Kidney, MD Department of Hematology/Oncology [facility] Phone: [phone] Pager: [phone] Email: [email or web address]  12/04/2023 9:06 PM

## 2023-12-04 ENCOUNTER — Encounter: Payer: Self-pay | Admitting: Hematology and Oncology

## 2023-12-05 LAB — KAPPA/LAMBDA LIGHT CHAINS
Kappa free light chain: 0.7 mg/L — ABNORMAL LOW (ref 3.3–19.4)
Kappa, lambda light chain ratio: >0.47 (ref 0.26–1.65)
Lambda free light chains: <1.5 mg/L — ABNORMAL LOW (ref 5.7–26.3)

## 2023-12-06 ENCOUNTER — Telehealth: Payer: Self-pay

## 2023-12-06 DIAGNOSIS — G610 Guillain-Barre syndrome: Secondary | ICD-10-CM | POA: Diagnosis not present

## 2023-12-06 DIAGNOSIS — Z881 Allergy status to other antibiotic agents status: Secondary | ICD-10-CM | POA: Diagnosis not present

## 2023-12-06 DIAGNOSIS — I480 Paroxysmal atrial fibrillation: Secondary | ICD-10-CM | POA: Diagnosis not present

## 2023-12-06 DIAGNOSIS — M84529D Pathological fracture in neoplastic disease, unspecified humerus, subsequent encounter for fracture with routine healing: Secondary | ICD-10-CM | POA: Diagnosis not present

## 2023-12-06 DIAGNOSIS — Z79624 Long term (current) use of inhibitors of nucleotide synthesis: Secondary | ICD-10-CM | POA: Diagnosis not present

## 2023-12-06 DIAGNOSIS — Z9089 Acquired absence of other organs: Secondary | ICD-10-CM | POA: Diagnosis not present

## 2023-12-06 DIAGNOSIS — G4700 Insomnia, unspecified: Secondary | ICD-10-CM | POA: Diagnosis not present

## 2023-12-06 DIAGNOSIS — C9000 Multiple myeloma not having achieved remission: Secondary | ICD-10-CM | POA: Diagnosis not present

## 2023-12-06 DIAGNOSIS — G894 Chronic pain syndrome: Secondary | ICD-10-CM | POA: Diagnosis not present

## 2023-12-06 DIAGNOSIS — I4811 Longstanding persistent atrial fibrillation: Secondary | ICD-10-CM | POA: Diagnosis not present

## 2023-12-06 DIAGNOSIS — M48061 Spinal stenosis, lumbar region without neurogenic claudication: Secondary | ICD-10-CM | POA: Diagnosis not present

## 2023-12-06 DIAGNOSIS — E876 Hypokalemia: Secondary | ICD-10-CM | POA: Diagnosis not present

## 2023-12-06 DIAGNOSIS — I10 Essential (primary) hypertension: Secondary | ICD-10-CM | POA: Diagnosis not present

## 2023-12-06 DIAGNOSIS — Z79899 Other long term (current) drug therapy: Secondary | ICD-10-CM | POA: Diagnosis not present

## 2023-12-06 DIAGNOSIS — Z86718 Personal history of other venous thrombosis and embolism: Secondary | ICD-10-CM | POA: Diagnosis not present

## 2023-12-06 DIAGNOSIS — M533 Sacrococcygeal disorders, not elsewhere classified: Secondary | ICD-10-CM | POA: Diagnosis not present

## 2023-12-06 DIAGNOSIS — E88819 Insulin resistance, unspecified: Secondary | ICD-10-CM | POA: Diagnosis not present

## 2023-12-06 DIAGNOSIS — I5030 Unspecified diastolic (congestive) heart failure: Secondary | ICD-10-CM | POA: Diagnosis not present

## 2023-12-06 DIAGNOSIS — Z7901 Long term (current) use of anticoagulants: Secondary | ICD-10-CM | POA: Diagnosis not present

## 2023-12-06 DIAGNOSIS — D849 Immunodeficiency, unspecified: Secondary | ICD-10-CM | POA: Diagnosis not present

## 2023-12-06 DIAGNOSIS — D801 Nonfamilial hypogammaglobulinemia: Secondary | ICD-10-CM | POA: Diagnosis not present

## 2023-12-06 DIAGNOSIS — R531 Weakness: Secondary | ICD-10-CM | POA: Diagnosis not present

## 2023-12-06 DIAGNOSIS — G4733 Obstructive sleep apnea (adult) (pediatric): Secondary | ICD-10-CM | POA: Diagnosis not present

## 2023-12-06 DIAGNOSIS — Z7961 Long term (current) use of immunomodulator: Secondary | ICD-10-CM | POA: Diagnosis not present

## 2023-12-06 DIAGNOSIS — M7989 Other specified soft tissue disorders: Secondary | ICD-10-CM | POA: Diagnosis not present

## 2023-12-06 DIAGNOSIS — Z823 Family history of stroke: Secondary | ICD-10-CM | POA: Diagnosis not present

## 2023-12-06 DIAGNOSIS — R2689 Other abnormalities of gait and mobility: Secondary | ICD-10-CM | POA: Diagnosis not present

## 2023-12-06 DIAGNOSIS — Z8249 Family history of ischemic heart disease and other diseases of the circulatory system: Secondary | ICD-10-CM | POA: Diagnosis not present

## 2023-12-06 DIAGNOSIS — D709 Neutropenia, unspecified: Secondary | ICD-10-CM | POA: Diagnosis not present

## 2023-12-06 DIAGNOSIS — R278 Other lack of coordination: Secondary | ICD-10-CM | POA: Diagnosis not present

## 2023-12-06 DIAGNOSIS — D472 Monoclonal gammopathy: Secondary | ICD-10-CM | POA: Diagnosis not present

## 2023-12-06 DIAGNOSIS — I4891 Unspecified atrial fibrillation: Secondary | ICD-10-CM | POA: Diagnosis not present

## 2023-12-06 DIAGNOSIS — R1310 Dysphagia, unspecified: Secondary | ICD-10-CM | POA: Diagnosis not present

## 2023-12-06 DIAGNOSIS — R001 Bradycardia, unspecified: Secondary | ICD-10-CM | POA: Diagnosis not present

## 2023-12-06 DIAGNOSIS — M6281 Muscle weakness (generalized): Secondary | ICD-10-CM | POA: Diagnosis not present

## 2023-12-06 DIAGNOSIS — G629 Polyneuropathy, unspecified: Secondary | ICD-10-CM | POA: Diagnosis not present

## 2023-12-06 DIAGNOSIS — D6859 Other primary thrombophilia: Secondary | ICD-10-CM | POA: Diagnosis not present

## 2023-12-06 DIAGNOSIS — I509 Heart failure, unspecified: Secondary | ICD-10-CM | POA: Diagnosis not present

## 2023-12-06 DIAGNOSIS — G8220 Paraplegia, unspecified: Secondary | ICD-10-CM | POA: Diagnosis not present

## 2023-12-06 DIAGNOSIS — R5381 Other malaise: Secondary | ICD-10-CM | POA: Diagnosis not present

## 2023-12-06 DIAGNOSIS — M5126 Other intervertebral disc displacement, lumbar region: Secondary | ICD-10-CM | POA: Diagnosis not present

## 2023-12-06 LAB — MULTIPLE MYELOMA PANEL, SERUM
Albumin SerPl Elph-Mcnc: 3.2 g/dL (ref 2.9–4.4)
Albumin/Glob SerPl: 1.1 (ref 0.7–1.7)
Alpha 1: 0.3 g/dL (ref 0.0–0.4)
Alpha2 Glob SerPl Elph-Mcnc: 0.9 g/dL (ref 0.4–1.0)
B-Globulin SerPl Elph-Mcnc: 0.9 g/dL (ref 0.7–1.3)
Gamma Glob SerPl Elph-Mcnc: 1.1 g/dL (ref 0.4–1.8)
Globulin, Total: 3.1 g/dL (ref 2.2–3.9)
IgA: <5 mg/dL — ABNORMAL LOW (ref 61–437)
IgG (Immunoglobin G), Serum: 1147 mg/dL (ref 603–1613)
IgM (Immunoglobulin M), Srm: <5 mg/dL — ABNORMAL LOW (ref 15–143)
Total Protein ELP: 6.3 g/dL (ref 6.0–8.5)

## 2023-12-06 NOTE — Telephone Encounter (Signed)
 [facility] Clinical Social Work  Clinical Social Work was referred by nurse for facility concerns.  Clinical Social Worker contacted patient by phone to offer support and assess for needs. Patient confirmed transportation arranged on 8/01 by CSW went smoothly, no complaints. Patient was able to arrange transportation for today to new facility [facility]. Patient believes new facility will better meet needs and will be more comfortable to reside in.    Follow Up Plan:  No Follow up scheduled. [facility] CSW is not involved with facility based transfers or concerns. Patient was able to facilitate transfer to new facility.    Lizbeth Sprague, LCSW  Clinical Social Worker [facility]

## 2023-12-08 DIAGNOSIS — D696 Thrombocytopenia, unspecified: Secondary | ICD-10-CM | POA: Diagnosis not present

## 2023-12-08 DIAGNOSIS — D649 Anemia, unspecified: Secondary | ICD-10-CM | POA: Diagnosis not present

## 2023-12-08 DIAGNOSIS — R600 Localized edema: Secondary | ICD-10-CM | POA: Diagnosis not present

## 2023-12-09 ENCOUNTER — Telehealth: Payer: Self-pay | Admitting: Hematology and Oncology

## 2023-12-09 ENCOUNTER — Ambulatory Visit (HOSPITAL_COMMUNITY)
Admission: RE | Admit: 2023-12-09 | Discharge: 2023-12-09 | Disposition: A | Discharge status: Home / Self Care | Source: Ambulatory Visit | Attending: Hematology and Oncology | Admitting: Hematology and Oncology

## 2023-12-09 DIAGNOSIS — C9000 Multiple myeloma not having achieved remission: Secondary | ICD-10-CM | POA: Insufficient documentation

## 2023-12-09 DIAGNOSIS — G610 Guillain-Barre syndrome: Secondary | ICD-10-CM | POA: Insufficient documentation

## 2023-12-12 ENCOUNTER — Telehealth: Payer: Self-pay

## 2023-12-12 ENCOUNTER — Ambulatory Visit: Payer: Self-pay

## 2023-12-12 NOTE — Telephone Encounter (Signed)
-----   Message from Norleen ONEIDA Kidney IV sent at 12/12/2023  9:18 AM EDT ----- Please let Mr. Hemp know that his ultrasound showed no evidence of lower extremity DVT. ----- Message ----- From: Interface, Three One Seven Sent: 12/09/2023   3:09 PM EDT To: Norleen ONEIDA Kidney [place], MD

## 2023-12-12 NOTE — Telephone Encounter (Signed)
 Spoke with patient regarding written confirmation for clearance of resistance exercises.   Contacted Whitney at [facility] and confirmed fax number as [phone]. Informed patient that a clearance letter, signed by Dr. Federico, was faxed to [phone] with confirmation received.  Also informed patient that per Dr. Federico, ultrasound results were negative for DVT. Patient voiced understanding.

## 2023-12-12 NOTE — Telephone Encounter (Signed)
 Attempted to contact Dr. Enrique office at [facility] regarding frequency of IVIG administration for Guillain-Barr. Left voicemail requesting a return call.

## 2023-12-12 NOTE — Telephone Encounter (Signed)
 Pt advised and said he had seen the results on My Chart.

## 2023-12-14 DIAGNOSIS — C9000 Multiple myeloma not having achieved remission: Secondary | ICD-10-CM | POA: Diagnosis not present

## 2023-12-14 DIAGNOSIS — R5381 Other malaise: Secondary | ICD-10-CM | POA: Diagnosis not present

## 2023-12-14 DIAGNOSIS — G610 Guillain-Barre syndrome: Secondary | ICD-10-CM | POA: Diagnosis not present

## 2023-12-19 ENCOUNTER — Other Ambulatory Visit: Payer: Self-pay | Admitting: Hematology and Oncology

## 2023-12-19 ENCOUNTER — Telehealth: Payer: Self-pay | Admitting: *Deleted

## 2023-12-19 NOTE — Telephone Encounter (Signed)
 Contacted by Izetta (@ [facility] [phone]) nurse coordinator w/Dr. Marlo in response to call made by this office requesting information about frequency of IVIG infusion.   Note - Dr. Marlo [phone] Per Izetta, patient has appt with Dr. Marlo 02/03/24 and Dr. Marlo would like patient to receive IVIG twice before that appt. Last infusion at [facility] was 11/19/23. Dr. Federico informed and he states he will put in orders. Mr. Brandenberger contacted - LVM on named voice mail that scheduling will contact him for appts. Schedule message sent

## 2023-12-21 ENCOUNTER — Telehealth: Payer: Self-pay | Admitting: *Deleted

## 2023-12-21 ENCOUNTER — Telehealth: Payer: Self-pay | Admitting: Hematology and Oncology

## 2023-12-21 NOTE — Progress Notes (Signed)
 Complex Care Management Note  Care Guide Note 12/21/2023 Name: Travis [name] MRN: [id number] DOB: 12-06-1950  Travis [name] is a 73 y.o. year old male who sees Charlott Dorn LABOR, MD for primary care. I reached out to Elsie CHRISTELLA Berry by phone today to offer complex care management services.  [name] was given information about Complex Care Management services today including:   The Complex Care Management services include support from the care team which includes your Nurse Care Manager, Clinical Social Worker, or Pharmacist.  The Complex Care Management team is here to help remove barriers to the health concerns and goals most important to you. Complex Care Management services are voluntary, and the patient may decline or stop services at any time by request to their care team member.   Complex Care Management Consent Status: Patient did not agree to participate in complex care management services at this time.  Follow up plan:  None  Encounter Outcome:  Patient Refused  Harlene Satterfield  [facility]  [facility], [facility] Guide  Direct Dial: [phone]  Fax [phone]

## 2023-12-21 NOTE — Telephone Encounter (Signed)
 TCT patient regarding his appts for IVIG  He voiced concern that he wouldn't be able to make it to the 7:30 am appts coming from [facility]. Spoke with him. Advised that those early morning appts were all that is currently available for the next couple of weeks. Advised that it is a 7 hour appt therefore it has to be early to accommodate the length of that treatment. He states he will ask his caregivers @ [facility] to help get him ready those days. Scheduling made aware.

## 2023-12-22 ENCOUNTER — Inpatient Hospital Stay

## 2023-12-22 ENCOUNTER — Encounter: Payer: Self-pay | Admitting: Hematology and Oncology

## 2023-12-22 VITALS — BP 141/68 | HR 66 | Temp 98.6°F | Resp 20

## 2023-12-22 DIAGNOSIS — I480 Paroxysmal atrial fibrillation: Secondary | ICD-10-CM | POA: Diagnosis not present

## 2023-12-22 DIAGNOSIS — M533 Sacrococcygeal disorders, not elsewhere classified: Secondary | ICD-10-CM | POA: Diagnosis not present

## 2023-12-22 DIAGNOSIS — M5126 Other intervertebral disc displacement, lumbar region: Secondary | ICD-10-CM | POA: Diagnosis not present

## 2023-12-22 DIAGNOSIS — Z79899 Other long term (current) drug therapy: Secondary | ICD-10-CM | POA: Diagnosis not present

## 2023-12-22 DIAGNOSIS — G4733 Obstructive sleep apnea (adult) (pediatric): Secondary | ICD-10-CM | POA: Diagnosis not present

## 2023-12-22 DIAGNOSIS — E876 Hypokalemia: Secondary | ICD-10-CM | POA: Diagnosis not present

## 2023-12-22 DIAGNOSIS — Z9089 Acquired absence of other organs: Secondary | ICD-10-CM | POA: Diagnosis not present

## 2023-12-22 DIAGNOSIS — M48061 Spinal stenosis, lumbar region without neurogenic claudication: Secondary | ICD-10-CM | POA: Diagnosis not present

## 2023-12-22 DIAGNOSIS — M7989 Other specified soft tissue disorders: Secondary | ICD-10-CM | POA: Diagnosis not present

## 2023-12-22 DIAGNOSIS — Z8249 Family history of ischemic heart disease and other diseases of the circulatory system: Secondary | ICD-10-CM | POA: Diagnosis not present

## 2023-12-22 DIAGNOSIS — R531 Weakness: Secondary | ICD-10-CM | POA: Diagnosis not present

## 2023-12-22 DIAGNOSIS — Z881 Allergy status to other antibiotic agents status: Secondary | ICD-10-CM | POA: Diagnosis not present

## 2023-12-22 DIAGNOSIS — Z79624 Long term (current) use of inhibitors of nucleotide synthesis: Secondary | ICD-10-CM | POA: Diagnosis not present

## 2023-12-22 DIAGNOSIS — Z7901 Long term (current) use of anticoagulants: Secondary | ICD-10-CM | POA: Diagnosis not present

## 2023-12-22 DIAGNOSIS — I10 Essential (primary) hypertension: Secondary | ICD-10-CM | POA: Diagnosis not present

## 2023-12-22 DIAGNOSIS — Z823 Family history of stroke: Secondary | ICD-10-CM | POA: Diagnosis not present

## 2023-12-22 DIAGNOSIS — C9000 Multiple myeloma not having achieved remission: Secondary | ICD-10-CM

## 2023-12-22 DIAGNOSIS — G610 Guillain-Barre syndrome: Secondary | ICD-10-CM | POA: Diagnosis not present

## 2023-12-22 DIAGNOSIS — Z86718 Personal history of other venous thrombosis and embolism: Secondary | ICD-10-CM | POA: Diagnosis not present

## 2023-12-22 DIAGNOSIS — R1310 Dysphagia, unspecified: Secondary | ICD-10-CM | POA: Diagnosis not present

## 2023-12-22 DIAGNOSIS — Z7961 Long term (current) use of immunomodulator: Secondary | ICD-10-CM | POA: Diagnosis not present

## 2023-12-22 MED ORDER — ACETAMINOPHEN 325 MG PO TABS
650.0000 mg | ORAL_TABLET | Freq: Once | ORAL | Status: AC
  Administered 2023-12-22: 650 mg via ORAL
  Filled 2023-12-22: qty 2

## 2023-12-22 MED ORDER — FAMOTIDINE IN NACL 20-0.9 MG/50ML-% IV SOLN
20.0000 mg | Freq: Once | INTRAVENOUS | Status: AC
  Administered 2023-12-22: 20 mg via INTRAVENOUS
  Filled 2023-12-22: qty 50

## 2023-12-22 MED ORDER — IMMUNE GLOBULIN (HUMAN) 10 GM/100ML IV SOLN
120.0000 g | Freq: Once | INTRAVENOUS | Status: AC
  Administered 2023-12-22: 120 g via INTRAVENOUS
  Filled 2023-12-22: qty 1200

## 2023-12-22 MED ORDER — DEXTROSE 5 % IV SOLN: INTRAVENOUS | Status: DC

## 2023-12-22 MED ORDER — DIPHENHYDRAMINE HCL 25 MG PO CAPS
25.0000 mg | ORAL_CAPSULE | Freq: Once | ORAL | Status: AC
Start: 2023-12-22 — End: 2023-12-22
  Administered 2023-12-22: 25 mg via ORAL
  Filled 2023-12-22: qty 1

## 2023-12-22 MED ORDER — SODIUM CHLORIDE 0.9 % IV SOLN: INTRAVENOUS | Status: DC

## 2023-12-22 NOTE — Patient Instructions (Signed)
 Immune Globulin  Injection What is this medication? IMMUNE GLOBULIN  (im MUNE GLOB yoo lin) treats many immune system conditions. It works by Designer, multimedia extra antibodies. Antibodies are proteins made by the immune system that help protect the body. This medicine may be used for other purposes; ask your health care provider or pharmacist if you have questions. COMMON BRAND NAME(S): ASCENIV, Baygam, BIVIGAM, Carimune, Carimune NF, cutaquig, Cuvitru, Flebogamma, Flebogamma DIF, GamaSTAN, GamaSTAN S/D, Gamimune N, Gammagard, Gammagard S/D, Gammaked, Gammaplex, Gammar-P IV, Gamunex, Gamunex-C, Hizentra, Iveegam, Iveegam EN, Octagam, Panglobulin, Panglobulin NF, panzyga, Polygam S/D, Privigen , Sandoglobulin, Venoglobulin-S, Vigam, Vivaglobulin, Xembify What should I tell my care team before I take this medication? They need to know if you have any of these conditions: Blood clotting disorder Condition where you have excess fluid in your body, such as heart failure or edema Dehydration Diabetes Have had blood clots Heart disease Immune system conditions Kidney disease Low levels of IgA Recent or upcoming vaccine An unusual or allergic reaction to immune globulin , other medications, foods, dyes, or preservatives Pregnant or trying to get pregnant Breastfeeding How should I use this medication? This medication is infused into a vein or under the skin. It may also be injected into a muscle. It is usually given by your care team in a hospital or clinic setting. It may also be given at home. If you get this medication at home, you will be taught how to prepare and give it. Take it as directed on the prescription label. Keep taking it unless your care team tells you to stop. It is important that you put your used needles and syringes in a special sharps container. Do not put them in a trash can. If you do not have a sharps container, call your pharmacist or care team to get one. Talk to your care team  about the use of this medication in children. While it may be given to children for selected conditions, precautions do apply. Overdosage: If you think you have taken too much of this medicine contact a poison control center or emergency room at once. NOTE: This medicine is only for you. Do not share this medicine with others. What if I miss a dose? If you get this medication at the hospital or clinic: It is important not to miss your dose. Call your care team if you are unable to keep an appointment. If you give yourself this medication at home: If you miss a dose, take it as soon as you can. Then continue your normal schedule. If it is almost time for your next dose, take only that dose. Do not take double or extra doses. Call your care team with questions. What may interact with this medication? Live virus vaccines This list may not describe all possible interactions. Give your health care provider a list of all the medicines, herbs, non-prescription drugs, or dietary supplements you use. Also tell them if you smoke, drink alcohol, or use illegal drugs. Some items may interact with your medicine. What should I watch for while using this medication? Your condition will be monitored carefully while you are receiving this medication. Tell your care team if your symptoms do not start to get better or if they get worse. You may need blood work done while you are taking this medication. This medication increases the risk of blood clots. People with heart, blood vessel, or blood clotting conditions are more likely to develop a blood clot. Other risk factors include advanced age, estrogen use, tobacco  use, lack of movement, and being overweight. This medication can decrease the response to a vaccine. If you need to get vaccinated, tell your care team if you have received this medication within the last year. Extra booster doses may be needed. Talk to your care team to see if a different vaccination schedule  is needed. This medication is made from donated human blood. There is a small risk it may contain bacteria or viruses, such as hepatitis or HIV. All products are processed to kill most bacteria and viruses. Talk to your care team if you have questions about the risk of infection. If you have diabetes, talk to your care team about which device you should use to check your blood sugar. This medication may cause some devices to report falsely high blood sugar levels. This may cause you to react by not treating a low blood sugar level or by giving an insulin  dose that was not needed. This can cause severe low blood sugar levels. What side effects may I notice from receiving this medication? Side effects that you should report to your care team as soon as possible: Allergic reactions--skin rash, itching, hives, swelling of the face, lips, tongue, or throat Blood clot--pain, swelling, or warmth in the leg, shortness of breath, chest pain Fever, neck pain or stiffness, sensitivity to light, headache, nausea, vomiting, confusion, which may be signs of meningitis Hemolytic anemia--unusual weakness or fatigue, dizziness, headache, trouble breathing, dark urine, yellowing skin or eyes Kidney injury--decrease in the amount of urine, swelling of the ankles, hands, or feet Low sodium level--muscle weakness, fatigue, dizziness, headache, confusion Shortness of breath or trouble breathing, cough, unusual weakness or fatigue, blue skin or lips Side effects that usually do not require medical attention (report these to your care team if they continue or are bothersome): Chills Diarrhea Fever Headache Nausea This list may not describe all possible side effects. Call your doctor for medical advice about side effects. You may report side effects to FDA at 1-800-FDA-1088. Where should I keep my medication? Keep out of the reach of children and pets. You will be instructed on how to store this medication. Get rid of  any unused medication after the expiration date. To get rid of medications that are no longer needed or have expired: Take the medication to a medication take-back program. Check with your pharmacy or law enforcement to find a location. If you cannot return the medication, ask your pharmacist or care team how to get rid of this medication safely. NOTE: This sheet is a summary. It may not cover all possible information. If you have questions about this medicine, talk to your doctor, pharmacist, or health care provider.  2025 Elsevier/Gold Standard (2023-07-04 00:00:00)

## 2023-12-22 NOTE — Progress Notes (Addendum)
 Patient received Gammagard 30 gm daily x 5 days with  apap and Ben for premeds in July at [facility]. Ok to add Pepcid  20mg  IV to premeds for Privigen  today. Confirmed IVIG dose w MD, 1gm/kg/day x 2 days.   Bridgett Leach [place], [place], BCPS, BCOP 12/22/2023 9:47 AM

## 2023-12-23 ENCOUNTER — Inpatient Hospital Stay

## 2023-12-23 VITALS — BP 164/88 | HR 97 | Temp 98.4°F | Resp 18

## 2023-12-23 DIAGNOSIS — Z86718 Personal history of other venous thrombosis and embolism: Secondary | ICD-10-CM | POA: Diagnosis not present

## 2023-12-23 DIAGNOSIS — Z823 Family history of stroke: Secondary | ICD-10-CM | POA: Diagnosis not present

## 2023-12-23 DIAGNOSIS — Z7961 Long term (current) use of immunomodulator: Secondary | ICD-10-CM | POA: Diagnosis not present

## 2023-12-23 DIAGNOSIS — G610 Guillain-Barre syndrome: Secondary | ICD-10-CM | POA: Diagnosis not present

## 2023-12-23 DIAGNOSIS — I10 Essential (primary) hypertension: Secondary | ICD-10-CM | POA: Diagnosis not present

## 2023-12-23 DIAGNOSIS — Z881 Allergy status to other antibiotic agents status: Secondary | ICD-10-CM | POA: Diagnosis not present

## 2023-12-23 DIAGNOSIS — Z8249 Family history of ischemic heart disease and other diseases of the circulatory system: Secondary | ICD-10-CM | POA: Diagnosis not present

## 2023-12-23 DIAGNOSIS — E876 Hypokalemia: Secondary | ICD-10-CM | POA: Diagnosis not present

## 2023-12-23 DIAGNOSIS — Z79899 Other long term (current) drug therapy: Secondary | ICD-10-CM | POA: Diagnosis not present

## 2023-12-23 DIAGNOSIS — R531 Weakness: Secondary | ICD-10-CM | POA: Diagnosis not present

## 2023-12-23 DIAGNOSIS — M533 Sacrococcygeal disorders, not elsewhere classified: Secondary | ICD-10-CM | POA: Diagnosis not present

## 2023-12-23 DIAGNOSIS — R1310 Dysphagia, unspecified: Secondary | ICD-10-CM | POA: Diagnosis not present

## 2023-12-23 DIAGNOSIS — Z7901 Long term (current) use of anticoagulants: Secondary | ICD-10-CM | POA: Diagnosis not present

## 2023-12-23 DIAGNOSIS — G4733 Obstructive sleep apnea (adult) (pediatric): Secondary | ICD-10-CM | POA: Diagnosis not present

## 2023-12-23 DIAGNOSIS — C9000 Multiple myeloma not having achieved remission: Secondary | ICD-10-CM

## 2023-12-23 DIAGNOSIS — Z9089 Acquired absence of other organs: Secondary | ICD-10-CM | POA: Diagnosis not present

## 2023-12-23 DIAGNOSIS — Z79624 Long term (current) use of inhibitors of nucleotide synthesis: Secondary | ICD-10-CM | POA: Diagnosis not present

## 2023-12-23 DIAGNOSIS — M5126 Other intervertebral disc displacement, lumbar region: Secondary | ICD-10-CM | POA: Diagnosis not present

## 2023-12-23 DIAGNOSIS — M48061 Spinal stenosis, lumbar region without neurogenic claudication: Secondary | ICD-10-CM | POA: Diagnosis not present

## 2023-12-23 DIAGNOSIS — M7989 Other specified soft tissue disorders: Secondary | ICD-10-CM | POA: Diagnosis not present

## 2023-12-23 DIAGNOSIS — I480 Paroxysmal atrial fibrillation: Secondary | ICD-10-CM | POA: Diagnosis not present

## 2023-12-23 MED ORDER — FAMOTIDINE IN NACL 20-0.9 MG/50ML-% IV SOLN
20.0000 mg | Freq: Once | INTRAVENOUS | Status: AC
  Administered 2023-12-23: 20 mg via INTRAVENOUS
  Filled 2023-12-23: qty 50

## 2023-12-23 MED ORDER — IMMUNE GLOBULIN (HUMAN) 20 GM/200ML IV SOLN
120.0000 g | Freq: Once | INTRAVENOUS | Status: AC
  Administered 2023-12-23: 120 g via INTRAVENOUS
  Filled 2023-12-23: qty 1200

## 2023-12-23 MED ORDER — SODIUM CHLORIDE 0.9 % IV SOLN: Freq: Once | INTRAVENOUS | Status: AC

## 2023-12-23 MED ORDER — DEXTROSE 5 % IV SOLN: INTRAVENOUS | Status: DC

## 2023-12-23 MED ORDER — ACETAMINOPHEN 325 MG PO TABS
650.0000 mg | ORAL_TABLET | Freq: Once | ORAL | Status: AC
  Administered 2023-12-23: 650 mg via ORAL
  Filled 2023-12-23: qty 2

## 2023-12-23 MED ORDER — DIPHENHYDRAMINE HCL 25 MG PO CAPS
25.0000 mg | ORAL_CAPSULE | Freq: Once | ORAL | Status: AC
  Administered 2023-12-23: 25 mg via ORAL
  Filled 2023-12-23: qty 1

## 2023-12-23 NOTE — Patient Instructions (Signed)
 Immune Globulin  Injection What is this medication? IMMUNE GLOBULIN  (im MUNE GLOB yoo lin) treats many immune system conditions. It works by [employer], multimedia extra antibodies. Antibodies are proteins made by the immune system that help protect the body. This medicine may be used for other purposes; ask your health care provider or pharmacist if you have questions. COMMON BRAND NAME(S): ASCENIV, Baygam, BIVIGAM, Carimune, Carimune NF, cutaquig, Cuvitru, Flebogamma, Flebogamma DIF, GamaSTAN, GamaSTAN S/D, Gamimune N, Gammagard, Gammagard S/D, Gammaked, Gammaplex, Gammar-P IV, Gamunex, Gamunex-C, Hizentra, Iveegam, Iveegam EN, Octagam, Panglobulin, Panglobulin NF, panzyga, Polygam S/D, Privigen , Sandoglobulin, Venoglobulin-S, Vigam, Vivaglobulin, Xembify What should I tell my care team before I take this medication? They need to know if you have any of these conditions: Blood clotting disorder Condition where you have excess fluid in your body, such as heart failure or edema Dehydration Diabetes Have had blood clots Heart disease Immune system conditions Kidney disease Low levels of IgA Recent or upcoming vaccine An unusual or allergic reaction to immune globulin , other medications, foods, dyes, or preservatives Pregnant or trying to get pregnant Breastfeeding How should I use this medication? This medication is infused into a vein or under the skin. It may also be injected into a muscle. It is usually given by your care team in a hospital or clinic setting. It may also be given at home. If you get this medication at home, you will be taught how to prepare and give it. Take it as directed on the prescription label. Keep taking it unless your care team tells you to stop. It is important that you put your used needles and syringes in a special sharps container. Do not put them in a trash can. If you do not have a sharps container, call your pharmacist or care team to get one. Talk to your care team  about the use of this medication in children. While it may be given to children for selected conditions, precautions do apply. Overdosage: If you think you have taken too much of this medicine contact a poison control center or emergency room at once. NOTE: This medicine is only for you. Do not share this medicine with others. What if I miss a dose? If you get this medication at the hospital or clinic: It is important not to miss your dose. Call your care team if you are unable to keep an appointment. If you give yourself this medication at home: If you miss a dose, take it as soon as you can. Then continue your normal schedule. If it is almost time for your next dose, take only that dose. Do not take double or extra doses. Call your care team with questions. What may interact with this medication? Live virus vaccines This list may not describe all possible interactions. Give your health care provider a list of all the medicines, herbs, non-prescription drugs, or dietary supplements you use. Also tell them if you smoke, drink alcohol , or use illegal drugs. Some items may interact with your medicine. What should I watch for while using this medication? Your condition will be monitored carefully while you are receiving this medication. Tell your care team if your symptoms do not start to get better or if they get worse. You may need blood work done while you are taking this medication. This medication increases the risk of blood clots. People with heart, blood vessel, or blood clotting conditions are more likely to develop a blood clot. Other risk factors include advanced age, estrogen use, tobacco  use, lack of movement, and being overweight. This medication can decrease the response to a vaccine. If you need to get vaccinated, tell your care team if you have received this medication within the last year. Extra booster doses may be needed. Talk to your care team to see if a different vaccination schedule  is needed. This medication is made from donated human blood. There is a small risk it may contain bacteria or viruses, such as hepatitis or HIV. All products are processed to kill most bacteria and viruses. Talk to your care team if you have questions about the risk of infection. If you have diabetes, talk to your care team about which device you should use to check your blood sugar. This medication may cause some devices to report falsely high blood sugar levels. This may cause you to react by not treating a low blood sugar level or by giving an insulin  dose that was not needed. This can cause severe low blood sugar levels. What side effects may I notice from receiving this medication? Side effects that you should report to your care team as soon as possible: Allergic reactions--skin rash, itching, hives, swelling of the face, lips, tongue, or throat Blood clot--pain, swelling, or warmth in the leg, shortness of breath, chest pain Fever, neck pain or stiffness, sensitivity to light, headache, nausea, vomiting, confusion, which may be signs of meningitis Hemolytic anemia--unusual weakness or fatigue, dizziness, headache, trouble breathing, dark urine, yellowing skin or eyes Kidney injury--decrease in the amount of urine, swelling of the ankles, hands, or feet Low sodium level--muscle weakness, fatigue, dizziness, headache, confusion Shortness of breath or trouble breathing, cough, unusual weakness or fatigue, blue skin or lips Side effects that usually do not require medical attention (report these to your care team if they continue or are bothersome): Chills Diarrhea Fever Headache Nausea This list may not describe all possible side effects. Call your doctor for medical advice about side effects. You may report side effects to FDA at 1-800-FDA-1088. Where should I keep my medication? Keep out of the reach of children and pets. You will be instructed on how to store this medication. Get rid of  any unused medication after the expiration date. To get rid of medications that are no longer needed or have expired: Take the medication to a medication take-back program. Check with your pharmacy or law enforcement to find a location. If you cannot return the medication, ask your pharmacist or care team how to get rid of this medication safely. NOTE: This sheet is a summary. It may not cover all possible information. If you have questions about this medicine, talk to your doctor, pharmacist, or health care provider.  2025 Elsevier/Gold Standard (2023-07-04 00:00:00)

## 2023-12-26 ENCOUNTER — Inpatient Hospital Stay: Admitting: Hematology and Oncology

## 2023-12-26 ENCOUNTER — Inpatient Hospital Stay

## 2023-12-26 ENCOUNTER — Telehealth: Payer: Self-pay | Admitting: Hematology and Oncology

## 2023-12-26 DIAGNOSIS — L270 Generalized skin eruption due to drugs and medicaments taken internally: Secondary | ICD-10-CM | POA: Diagnosis not present

## 2023-12-26 DIAGNOSIS — T887XXA Unspecified adverse effect of drug or medicament, initial encounter: Secondary | ICD-10-CM | POA: Diagnosis not present

## 2023-12-26 NOTE — Progress Notes (Unsigned)
 Cardiology Office Note:  .   Date:  12/27/2023  ID:  Travis [name], DOB 23-Feb-1951, MRN [id number] PCP: Travis Dorn LABOR, MD  [facility] Providers Cardiologist:  Travis Gull, MD Electrophysiologist:  Travis ONEIDA HOLTS, MD  Sleep Medicine:  Travis Bihari, MD {  History of Present Illness: .   Travis [name] is a 73 y.o. male with a past medical history of PAF (on Eliquis ) status post PVI ablation 2018, hypertension, OSA, prior DVT, monoclonal gammopathy (smoldering myeloma), peripheral neuropathy who presents today for follow-up appointment.   Was last seen by Travis Alberts, NP 10/20/2022.  Had been followed by Dr. Gull for many years for management of hypertension and atrial fibrillation.  Completed a Myoview  10/17 that was normal with no evidence of ischemia.  Diagnosed with atrial fibrillation in 2017 and underwent PVI ablation by Dr. Kelsie 2018.  Also completed cardiac CTA at that time showing no evidence of calcium .  Had an echocardiogram 2019 that showed LVEF 65% with no RWMA, normal diastolic function with aortic root measuring 40 mm.  Treated with Darzalex , Revlimid , and dexamethasone  12/28/2021 for multiple myeloma.  Seen in the AF clinic on 01/2022 for management of A-fib and plan for outpatient cardioversion however patient spontaneously converted.  Developed AF with RVR 03/2022 while in [place] city and required diuresis.  He was seen 04/2022 for follow-up and was back in atrial flutter with no resolution with metoprolol .  He was started on amiodarone  and metoprolol  was cut back.  Referred to Dr. HOLTS for discussion of treatment options 05/12/2022.  Amiodarone  was reduced to 200 mg daily and blood pressure was noted to be above goal.  Hydralazine  was increased to 37.53 times a day.  Seen in the ED 06/2022 with AF RVR after suffering a fall.  2D echo was completed with LVEF 60 to 65% with grade 2 DD.  Patient in normal sinus rhythm at that time.  Worsening dyspnea after being  discharged and was brought back to the ER via EMS.  CT scan was completed showing no evidence of PE but did reveal bilateral pneumonia with small bilateral pleural effusion.  Found to be in AF with RVR on arrival with acute CHF exacerbation.  Was loaded with amnio and diuresed.  Was seen in AF clinic 07/15/2022 with plan to pursue DCCV if sinus rhythm was not maintained with amiodarone .  He was seen by Dr. Gull 07/29/2022 and was maintaining sinus rhythm therefore DCCV was canceled.  He was seen by Travis [name]/9/24 for evaluation of lower extremity edema.  He reported poor response to Lasix  which was increased to 80 mg daily.  Was maintained on Eliquis  and amiodarone .  Had addition of metolazone  2.5 mg to help with diuresis.  Contacted office shortly after complaining of elevated systolic blood pressure.  Hydralazine  was increased to 50/25/50 mg daily and then switch to 75 mg 3 times daily.  He presented for follow-up with Travis Alberts, NP few weeks ago.  Was seen for elevated blood pressures.  Seen in the office and patient reported experiencing elevated blood pressures with systolic readings in the 160s to 170s.  Some shortness of breath and slightly volume up during that exam.  Compliant with current medications no adverse reactions.  Denied any new supplements or sodium indiscretions in his diet.  He does note some discomfort since recovering from thoracic spine surgery.  Blood pressure was 174/82 and recheck of blood pressure was not completed until after patient's exam.  Denied any  headaches or vision changes.  Denied chest pain, palpitations, dyspnea, PND, orthopnea, nausea, vomiting, dizziness, syncope, weight gain, or early satiety.  He saw me 11/05/22, he tells me that he was not in a week and then it broke.  He has sinus bradycardia when he is out of A-fib before but typically does not last long.  Heart rate is 45 bpm today.  Patient states that he could barely get out of his chair (he went for a pedicure  this morning).  He is extremely weak and tired.  Also states that he feels fluid overloaded today.  He was taking metoprolol  50 mg daily a few days in a row since he was in A-fib and then when he converted his heart rate went down to the 40s and did not recover.  He also is having issues with his back and has a herniated disc that is affecting his arms and legs.  He has follow-up for this scheduled.  Unfortunately, the patient and his wife were headed out of the country tomorrow on a cruise around [place] and we have advised that they do not travel in his current condition.  We discussed his most recent echocardiogram.  We also discussed getting a ZIO monitor as well as increasing his diuretics for short time to get his fluid level under control.  He was seen by Dr. Okey a few weeks ago and at that time he was encouraged to take spironolactone  25 mg daily and decrease his Zaroxolyn  to 1.25 mg every other day.  He is currently on 80 mEq every other day and 60 mEq on the other days of potassium for hypokalemia.  Last potassium was 2.9 yesterday.  It was redrawn today and results were not available to view quite yet.  He still takes some steroids before chemo which probably is not helping with his fluid status.  He states he was recently in [place]  for his grandsons birthday.  He turned 1.  They had a good time.  We discussed his spironolactone  and that he needs to continue that at 25 mg daily and continue half a tab of metolazone  every other day.  His Demadex  did not change today and he remains on 40 mg daily.  His weight has been all over the place.  States he can gain 3 pounds 2 days in a row and then lose 3 pounds 2 days in a row quite easily.  No shortness of breath today.  No chest pain.  Reports no shortness of breath nor dyspnea on exertion. Reports no chest pain, pressure, or tightness. No edema, orthopnea, PND. Reports no palpitations.   He was seen by Dr. Okey 05/24/23 and was thirsty all the  time on his diuretics. Creatinine and K were in a good range.   Today, he  presents with hx of atrial fibrillation for cardiovascular follow-up and management of fluid retention.  He ended up with GBS due to his multiple myeloma medication and is currently in rehab at [facility].   He experiences fluctuating blood pressure readings, with recent measurements at 150 mmHg. Previously, his blood pressure was as low as 115-120 mmHg during IVIG infusions. He is currently on Entresto  and has not experienced consistent low blood pressure readings. No dizziness or lightheadedness is associated with low blood pressure.  His atrial fibrillation is managed with amiodarone , and he has not had recent episodes. He has experienced fluctuations in potassium levels, which have been as low as 3.2 mmol/L. He takes potassium  supplements, with adjustments based on lab results.  He is on a regimen of diuretics including torsemide  and spironolactone . He reports fluid retention and difficulty with consistent diuretic use due to the need for assistance with toileting. He also takes hydralazine  100 mg three times daily for blood pressure management.   ROS: Pertinent ROS in HPI  Studies Reviewed: .       Echocardiogram 09/08/2022 IMPRESSIONS     1. Left ventricular ejection fraction, by estimation, is 60 to 65%. The  left ventricle has normal function. The left ventricle has no regional  wall motion abnormalities. There is severe concentric left ventricular  hypertrophy. Left ventricular diastolic   parameters are indeterminate.   2. Right ventricular systolic function is normal. The right ventricular  size is mildly enlarged. There is normal pulmonary artery systolic  pressure.   3. Left atrial size was severely dilated.   4. Right atrial size was mildly dilated.   5. The mitral valve is normal in structure. No evidence of mitral valve  regurgitation. No evidence of mitral stenosis.   6. The aortic valve is  normal in structure. Aortic valve regurgitation is  not visualized. No aortic stenosis is present.   7. There is mild dilatation of the ascending aorta, measuring 37 mm.   8. The inferior vena cava is dilated in size with <50% respiratory  variability, suggesting right atrial pressure of 15 mmHg.   FINDINGS   Left Ventricle: Left ventricular ejection fraction, by estimation, is 60  to 65%. The left ventricle has normal function. The left ventricle has no  regional wall motion abnormalities. The global longitudinal strain is  normal despite suboptimal segment  tracking. The left ventricular internal cavity size was normal in size.  There is severe concentric left ventricular hypertrophy. Left ventricular  diastolic parameters are indeterminate.   Right Ventricle: The right ventricular size is mildly enlarged. No  increase in right ventricular wall thickness. Right ventricular systolic  function is normal. There is normal pulmonary artery systolic pressure.  The tricuspid regurgitant velocity is 2.12   m/s, and with an assumed right atrial pressure of 8 mmHg, the estimated  right ventricular systolic pressure is 26.0 mmHg.   Left Atrium: Left atrial size was severely dilated.   Right Atrium: Right atrial size was mildly dilated.   Pericardium: There is no evidence of pericardial effusion. Presence of  epicardial fat layer.   Mitral Valve: The mitral valve is normal in structure. No evidence of  mitral valve regurgitation. No evidence of mitral valve stenosis.   Tricuspid Valve: The tricuspid valve is normal in structure. Tricuspid  valve regurgitation is trivial. No evidence of tricuspid stenosis.   Aortic Valve: The aortic valve is normal in structure. Aortic valve  regurgitation is not visualized. No aortic stenosis is present.   Pulmonic Valve: The pulmonic valve was not well visualized. Pulmonic valve  regurgitation is trivial. No evidence of pulmonic stenosis.   Aorta:  There is mild dilatation of the ascending aorta, measuring 37 mm.   Venous: The inferior vena cava is dilated in size with less than 50%  respiratory variability, suggesting right atrial pressure of 15 mmHg.   IAS/Shunts: No atrial level shunt detected by color flow Doppler.       Physical Exam:   VS:  Pulse 68   Ht 5' 10 (1.778 m)   Wt 241 lb (109.3 kg)   SpO2 98%   BMI 34.58 kg/m    Wt Readings from Last  3 Encounters:  12/27/23 241 lb (109.3 kg)  12/02/23 241 lb (109.3 kg)  08/08/23 258 lb (117 kg)    GEN: Well nourished, well developed in no acute distress NECK: No JVD; No carotid bruits CARDIAC:sinus bradycardia, no murmurs, rubs, gallops RESPIRATORY:  Clear to auscultation without rales, wheezing or rhonchi  ABDOMEN: Soft, non-tender, distended EXTREMITIES: + R > L LE edema; No deformity   ASSESSMENT AND PLAN: .    Heart failure with reduced ejection fraction with volume overload and peripheral edema Volume overload and peripheral edema due to suboptimal diuretic regimen. Weight loss possibly due to decreased appetite. - Increase torsemide  to 20 mg every other day. - Perform daily weights at the same time each day. - Monitor for weight gain of more than 2 pounds overnight or 5 pounds in a week. - Adjust diuretics based on weight changes. - Continue spironolactone  25 mg daily. - Hold metolazone .  Paroxysmal atrial fibrillation Amiodarone  effective in maintaining sinus rhythm. Electrolyte management crucial. - Continue amiodarone . - Monitor electrolytes regularly.  Recurrent hypokalemia Potassium levels fluctuate, recent level at 3.3 mmol/L. - Check potassium levels at the cancer clinic tomorrow. - Adjust potassium supplementation based on lab results.  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 Neuropathy managed with gabapentin . - Continue physical therapy. - Continue  gabapentin  600 mg three times a day.  Multiple myeloma in remission In remission.  Less than 1% risk of GBS as a side effect. - Continue monthly IVIG infusions.  Left ankle sprain Occurred during physical therapy due to lack of sensation. Using a boot for support. - Continue using boot for support during physical therapy.  Rash due to IVIG infusion Rash likely an immune response to recent IVIG infusion. - Continue Claritin  for itching. - Apply lotion to affected areas. - Consider Benadryl  cream or gel if needed.  Obstructive sleep apnea on CPAP Managed with CPAP. New mask well-tolerated and effective. - Continue CPAP therapy with new mask.      Dispo: follow-up 3-4 month with APP or Dr. Okey  Signed, Travis LOISE Fabry, PA-C

## 2023-12-27 ENCOUNTER — Ambulatory Visit: Attending: Physician Assistant | Admitting: Physician Assistant

## 2023-12-27 ENCOUNTER — Ambulatory Visit: Admitting: Hematology and Oncology

## 2023-12-27 ENCOUNTER — Other Ambulatory Visit

## 2023-12-27 VITALS — HR 68 | Ht 70.0 in | Wt 241.0 lb

## 2023-12-27 DIAGNOSIS — R001 Bradycardia, unspecified: Secondary | ICD-10-CM | POA: Diagnosis not present

## 2023-12-27 DIAGNOSIS — G4733 Obstructive sleep apnea (adult) (pediatric): Secondary | ICD-10-CM

## 2023-12-27 DIAGNOSIS — E876 Hypokalemia: Secondary | ICD-10-CM

## 2023-12-27 DIAGNOSIS — D472 Monoclonal gammopathy: Secondary | ICD-10-CM | POA: Diagnosis not present

## 2023-12-27 DIAGNOSIS — G610 Guillain-Barre syndrome: Secondary | ICD-10-CM | POA: Diagnosis not present

## 2023-12-27 DIAGNOSIS — Z79899 Other long term (current) drug therapy: Secondary | ICD-10-CM | POA: Diagnosis not present

## 2023-12-27 DIAGNOSIS — I10 Essential (primary) hypertension: Secondary | ICD-10-CM | POA: Diagnosis not present

## 2023-12-27 DIAGNOSIS — I5030 Unspecified diastolic (congestive) heart failure: Secondary | ICD-10-CM | POA: Diagnosis not present

## 2023-12-27 MED ORDER — TORSEMIDE 20 MG PO TABS: 20.0000 mg | ORAL_TABLET | Freq: Every day | ORAL | 3 refills | Status: AC

## 2023-12-27 NOTE — Patient Instructions (Signed)
 Medication Instructions:  Your physician has recommended you make the following change in your medication:  INCREASE TORSEMIDE  TO 20 MG EVERY OTHER DAY   *If you need a refill on your cardiac medications before your next appointment, please call your pharmacy*  Lab Work: PLEASE OBTAIN A BMET AT YOUR NEXT DOCTOR'S VISIT  TO BE DONE IN 3-4 MONTHS: BNP, BMET If you have labs (blood work) drawn today and your tests are completely normal, you will receive your results only by: MyChart Message (if you have MyChart) OR A paper copy in the mail If you have any lab test that is abnormal or we need to change your treatment, we will call you to review the results.  Testing/Procedures: NONE  Follow-Up: At [facility], you and your health needs are our priority.  As part of our continuing mission to provide you with exceptional heart care, our providers are all part of one team.  This team includes your primary Cardiologist (physician) and Advanced Practice Providers or APPs (Physician Assistants and Nurse Practitioners) who all work together to provide you with the care you need, when you need it.  Your next appointment:   3-30month(s)  Provider:   Vina Gull, MD   We recommend signing up for the patient portal called MyChart.  Sign up information is provided on this After Visit Summary.  MyChart is used to connect with patients for Virtual Visits (Telemedicine).  Patients are able to view lab/test results, encounter notes, upcoming appointments, etc.  Non-urgent messages can be sent to your provider as well.   To learn more about what you can do with MyChart, go to [email or web address].   Other Instructions Please make sure to weigh daily at the same time and if your weight increases 3 lbs or more in 1 day or 5 lbs in week call our office to let us  know [phone].

## 2023-12-28 ENCOUNTER — Inpatient Hospital Stay (HOSPITAL_BASED_OUTPATIENT_CLINIC_OR_DEPARTMENT_OTHER): Admitting: Hematology and Oncology

## 2023-12-28 ENCOUNTER — Inpatient Hospital Stay

## 2023-12-28 VITALS — BP 156/82 | HR 65 | Temp 98.0°F | Resp 13

## 2023-12-28 DIAGNOSIS — G4733 Obstructive sleep apnea (adult) (pediatric): Secondary | ICD-10-CM | POA: Diagnosis not present

## 2023-12-28 DIAGNOSIS — E876 Hypokalemia: Secondary | ICD-10-CM | POA: Diagnosis not present

## 2023-12-28 DIAGNOSIS — M7989 Other specified soft tissue disorders: Secondary | ICD-10-CM | POA: Diagnosis not present

## 2023-12-28 DIAGNOSIS — Z881 Allergy status to other antibiotic agents status: Secondary | ICD-10-CM | POA: Diagnosis not present

## 2023-12-28 DIAGNOSIS — I480 Paroxysmal atrial fibrillation: Secondary | ICD-10-CM | POA: Diagnosis not present

## 2023-12-28 DIAGNOSIS — M48061 Spinal stenosis, lumbar region without neurogenic claudication: Secondary | ICD-10-CM | POA: Diagnosis not present

## 2023-12-28 DIAGNOSIS — Z86718 Personal history of other venous thrombosis and embolism: Secondary | ICD-10-CM | POA: Diagnosis not present

## 2023-12-28 DIAGNOSIS — G610 Guillain-Barre syndrome: Secondary | ICD-10-CM

## 2023-12-28 DIAGNOSIS — R1310 Dysphagia, unspecified: Secondary | ICD-10-CM | POA: Diagnosis not present

## 2023-12-28 DIAGNOSIS — Z9089 Acquired absence of other organs: Secondary | ICD-10-CM | POA: Diagnosis not present

## 2023-12-28 DIAGNOSIS — Z7901 Long term (current) use of anticoagulants: Secondary | ICD-10-CM | POA: Diagnosis not present

## 2023-12-28 DIAGNOSIS — Z8249 Family history of ischemic heart disease and other diseases of the circulatory system: Secondary | ICD-10-CM | POA: Diagnosis not present

## 2023-12-28 DIAGNOSIS — Z79899 Other long term (current) drug therapy: Secondary | ICD-10-CM | POA: Diagnosis not present

## 2023-12-28 DIAGNOSIS — C9000 Multiple myeloma not having achieved remission: Secondary | ICD-10-CM

## 2023-12-28 DIAGNOSIS — M533 Sacrococcygeal disorders, not elsewhere classified: Secondary | ICD-10-CM | POA: Diagnosis not present

## 2023-12-28 DIAGNOSIS — M5126 Other intervertebral disc displacement, lumbar region: Secondary | ICD-10-CM | POA: Diagnosis not present

## 2023-12-28 DIAGNOSIS — I10 Essential (primary) hypertension: Secondary | ICD-10-CM | POA: Diagnosis not present

## 2023-12-28 DIAGNOSIS — R531 Weakness: Secondary | ICD-10-CM | POA: Diagnosis not present

## 2023-12-28 DIAGNOSIS — Z823 Family history of stroke: Secondary | ICD-10-CM | POA: Diagnosis not present

## 2023-12-28 DIAGNOSIS — Z79624 Long term (current) use of inhibitors of nucleotide synthesis: Secondary | ICD-10-CM | POA: Diagnosis not present

## 2023-12-28 DIAGNOSIS — Z7961 Long term (current) use of immunomodulator: Secondary | ICD-10-CM | POA: Diagnosis not present

## 2023-12-28 LAB — CBC WITH DIFFERENTIAL (CANCER CENTER ONLY)
Abs Immature Granulocytes: 0.01 K/uL (ref 0.00–0.07)
Basophils Absolute: 0.1 K/uL (ref 0.0–0.1)
Basophils Relative: 1 %
Eosinophils Absolute: 0.1 K/uL (ref 0.0–0.5)
Eosinophils Relative: 1 %
HCT: 36.1 % — ABNORMAL LOW (ref 39.0–52.0)
Hemoglobin: 12.1 g/dL — ABNORMAL LOW (ref 13.0–17.0)
Immature Granulocytes: 0 %
Lymphocytes Relative: 8 %
Lymphs Abs: 0.4 K/uL — ABNORMAL LOW (ref 0.7–4.0)
MCH: 31.3 pg (ref 26.0–34.0)
MCHC: 33.5 g/dL (ref 30.0–36.0)
MCV: 93.3 fL (ref 80.0–100.0)
Monocytes Absolute: 0.7 K/uL (ref 0.1–1.0)
Monocytes Relative: 12 %
Neutro Abs: 4.4 K/uL (ref 1.7–7.7)
Neutrophils Relative %: 78 %
Platelet Count: 173 K/uL (ref 150–400)
RBC: 3.87 MIL/uL — ABNORMAL LOW (ref 4.22–5.81)
RDW: 14 % (ref 11.5–15.5)
WBC Count: 5.7 K/uL (ref 4.0–10.5)
nRBC: 0 % (ref 0.0–0.2)

## 2023-12-28 LAB — CMP (CANCER CENTER ONLY)
ALT: 14 U/L (ref 0–44)
AST: 18 U/L (ref 15–41)
Albumin: 3.4 g/dL — ABNORMAL LOW (ref 3.5–5.0)
Alkaline Phosphatase: 67 U/L (ref 38–126)
Anion gap: 6 (ref 5–15)
BUN: 16 mg/dL (ref 8–23)
CO2: 27 mmol/L (ref 22–32)
Calcium: 8.7 mg/dL — ABNORMAL LOW (ref 8.9–10.3)
Chloride: 104 mmol/L (ref 98–111)
Creatinine: 0.66 mg/dL (ref 0.61–1.24)
GFR, Estimated: >60 mL/min (ref >60)
Glucose, Bld: 86 mg/dL (ref 70–99)
Potassium: 3.6 mmol/L (ref 3.5–5.1)
Sodium: 137 mmol/L (ref 135–145)
Total Bilirubin: 0.2 mg/dL (ref 0.0–1.2)
Total Protein: 7.9 g/dL (ref 6.5–8.1)

## 2023-12-28 LAB — LACTATE DEHYDROGENASE: LDH: 148 U/L (ref 98–192)

## 2023-12-28 NOTE — Progress Notes (Signed)
 [facility] [phone]   [phone]  PROGRESS NOTE  Patient Care Team: Charlott Dorn LABOR, MD as PCP - General (Internal Medicine) Okey Vina GAILS, MD as PCP - Cardiology (Cardiology) Shlomo Wilbert SAUNDERS, MD as PCP - Sleep Medicine (Cardiology) Cindie Ole DASEN, MD as PCP - Electrophysiology (Cardiology) Sheldon Standing, MD as Consulting Physician (General Surgery) Rosalie Kitchens, MD as Consulting Physician (Gastroenterology) Shlomo Wilbert SAUNDERS, MD as Consulting Physician (Sleep Medicine) Tobie Tonita POUR, DO as Consulting Physician (Neurology)  Hematological/Oncological History # IgA Kappa Multiple Myeloma 05/05/2020: Bone marrow biopsy showed increased number of plasma cells representing 14% of all cells in the aspirate associated with numerous variably sized clusters in the clot and biopsy sections 04/10/2021: M protein 0.3, IgA kappa specificity. Kappa 580.5, Labda 7.0, ratio 82.93.   12/07/2021: Labs show of 1971.3, lambda 5.5, ratio 358.42 12/10/2021: Transition care to Dr. Federico. 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  01/26/2022: Cycle 2 Day 1 of Dara/Rev/Dex 02/22/2022: Cycle 3 Day 1 of Dara/Rev/Dex 03/23/2022: Cycle 4 Day 1 of Dara/Rev/Dex 04/20/2022: Cycle 5 Day 1 of Dara/Rev/Dex 05/18/2022: Cycle 6 Day 1 of Dara/Rev/Dex 05/27/2022: MRI cervical/thoracic/lumbar: pathologic fracture at T2 with significant osseous retropulsion and resulting cord compression. Additional smaller lesions within T1 vertebral body, right C7 and T3 articulating facets.  Small Multiple Myeloma lesions scattered at all lumbar levels and in the visible left sacral ala. Largest lumbar tumor is 2.3 cm in the left L1 posterior elements and pedicle. No lumbar extraosseous or epidural tumor extension, or pathologic fracture.Underlying chronic lumbar spine  degeneration, with a new central disc herniation at L3-L4 since 2021 now resulting in mild to moderate degenerative spinal stenosis there. 05/28/2022-06/09/2022: Admitted for T2 pathologic fracture and cord compression. He received urgent radiation therapy for a total of 3.0 Gy. He underwent  thoracic 1 through 4 posterior lateral arthrodesis/fusion and 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  06/23/2022: Cycle 1 Day 1 of Velcade /Pomalyst /Dex 08/04/2022: Cycle 2 Day 1 of Velcade /Pomalyst /Dex. Pomalyst  to be decreased to 2mg  PO daily due to cytopenias.  09/01/2022: Cycle 3 Day 1 of Velcade /Pomalyst /Dex. (Missed 2nd dose of cycle due to hospitalization)  10/06/2022: Cycle 4 Day 1 of Velcade /Pomalyst /Dex.  10/27/2022: Cycle 5 Day 1 of Velcade /Pomalyst /Dex.  12/02/2022: Cycle 6 Day 1 of Velcade /Pomalyst /Dex. 01/12/2023: Cycle 1 Day 1 of Kyprolis /Dex 02/09/2023: Cycle 2 Day 1 of Kyprolis /Dex   04/07/2023: Transitioned to selinexor  and Pomalyst  per request of transplant team due to progression on Kyprolis  and dexamethasone  06/27/2023: start of Carvykti.  Complicated by Guillain-Barr  Interval History:  Travis [name] 73 y.o. male with medical history significant for IgA Kappa Multiple Myeloma who presents for a follow up visit. He was last seen on 12/02/2023. 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   On exam today Travis [name] reports he did develop a itchy rash with his most recent IVIG therapy.  He notes that he has been having a lot of itching on his back as well as some on his arms and abdomen.  He has been a applying lotion and was recommended Polysporin which we do agree with.  He notes he is not having any major side effects otherwise from his IVIG.  He did have some diarrhea recently but no nausea or vomiting.  He reports his stools tend  to be soft but not liquid.  He had been constipated previously.  He reports his  appetite has been okay.  His weight has stabilized.  He is not currently in any pain and he said no infectious symptoms such as runny nose, sore throat, or cough.  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  His next upcoming visit with [facility] is on 02/03/2024.  He otherwise denies any fevers, chills, sweats.  A full 10 point ROS is otherwise negative.   MEDICAL HISTORY:  Past Medical History:  Diagnosis Date   A-fib [facility])    Arthritis    comes and goes; mostly in hands, occasionally elbow (04/05/2017)   Complication of anesthesia    just wanted to sleep alot  for couple days S/P VARICOCELE OR (04/05/2017)   DVT (deep venous thrombosis) (HCC)    LLE   Dysrhythmia    PVCs    GERD (gastroesophageal reflux disease)    History of hiatal hernia    History of radiation therapy    Thoracic Spine- 07/13/22-07/23/22-Dr. Lynwood Nasuti   Hypertension    Impaired glucose tolerance    Multiple myeloma (HCC)    Obesity (BMI 30-39.9) 07/26/2016   OSA on CPAP 07/26/2016   Mild with AHI 12.5/hr now on CPAP at 14cm H2O   PAF (paroxysmal atrial fibrillation) (HCC)    s/p PVI Ablation in 2018 // Flecainide  Rx // Echo 8/21: EF 60-65, no RWMA, mild LVH, normal RV SF, moderate RVE, mild BAE, trivial MR, mild dilation of aortic root (40 mm)   Pneumonia    may have had walking pneumonia a few years ago (04/05/2017)   Pre-diabetes    Thrombophlebitis     SURGICAL HISTORY: Past Surgical History:  Procedure Laterality Date   ATRIAL FIBRILLATION ABLATION  04/05/2017   ATRIAL FIBRILLATION ABLATION N/A 04/05/2017   Procedure: ATRIAL FIBRILLATION ABLATION;  Surgeon: Kelsie Lynwood, MD;  Location: [facility];  Service: Cardiovascular;  Laterality: N/A;   BONE BIOPSY Right 12/17/2021   Procedure: BONE BIOPSY;  Surgeon: Cristy Bonner DASEN, MD;  Location: [facility];  Service: Orthopedics;  Laterality: Right;    COMPLETE RIGHT HIP REPLACMENT  01/19/2019   EVALUATION UNDER ANESTHESIA WITH HEMORRHOIDECTOMY N/A 02/24/2017   Procedure: EXAM UNDER ANESTHESIA WITH  HEMORRHOIDECTOMY;  Surgeon: Sheldon Standing, MD;  Location: [facility];  Service: General;  Laterality: N/A;   EXCISIONAL TOTAL HIP ARTHROPLASTY WITH ANTIBIOTIC SPACERS Right 09/25/2020   Procedure: EXCISIONAL TOTAL HIP ARTHROPLASTY WITH ANTIBIOTIC SPACERS;  Surgeon: Ernie Cough, MD;  Location: [facility];  Service: Orthopedics;  Laterality: Right;   FLEXIBLE SIGMOIDOSCOPY N/A 04/15/2015   Procedure:  UNSEDATED FLEXIBLE SIGMOIDOSCOPY;  Surgeon: Gladis MARLA Louder, MD;  Location: [facility];  Service: Endoscopy;  Laterality: N/A;   HUMERUS IM NAIL Right 12/17/2021   Procedure: INTRAMEDULLARY (IM) NAIL HUMERAL;  Surgeon: Cristy Bonner DASEN, MD;  Location:  SURGERY CENTER;  Service: Orthopedics;  Laterality: Right;   KNEE ARTHROSCOPY Left ~ 2016   POSTERIOR CERVICAL FUSION/FORAMINOTOMY N/A 06/04/2022   Procedure: Thoracic one - Thoracic Four Posterior lateral arthrodesis/ fusion and Thoracic two Corpectomy;  Surgeon: Gillie Duncans, MD;  Location: [facility];  Service: Neurosurgery;  Laterality: N/A;   REIMPLANTATION OF TOTAL HIP Right 12/25/2020   Procedure: REIMPLANTATION/REVISION OF RIGHT TOTAL HIP VERSUS REPEAT IRRIGATION AND DEBRIDEMENT;  Surgeon: Ernie Cough, MD;  Location: [facility];  Service:  Orthopedics;  Laterality: Right;   TESTICLE SURGERY  1988   VARICOCELE   TONSILLECTOMY     VARICOSE VEIN SURGERY Bilateral     SOCIAL HISTORY: Social History   Socioeconomic History   Marital status: Married    Spouse name: Not on file   Number of children: 1   Years of education: law school   Highest education level: Not on file  Occupational History   Occupation: lawyer  Tobacco Use   Smoking status: Never   Smokeless tobacco: Never  Vaping Use   Vaping status: Never Used  Substance and Sexual Activity   Alcohol use: Yes    Alcohol/week: 2.0 standard  drinks of alcohol    Types: 2 Glasses of wine per week    Comment: 2 glasses of wine a week, occ bourbon, beer   Drug use: No   Sexual activity: Not Currently  Other Topics Concern   Not on file  Social History Narrative   Lives in [place] with spouse in a 2 story home.  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 1 daughter.     Education: Social worker school.    Social Drivers of Corporate investment banker Strain: High Risk (11/15/2023)   Received from [facility]   Overall Financial Resource Strain (CARDIA)    Difficulty of Paying Living Expenses: Very hard  Food Insecurity: No Food Insecurity (11/15/2023)   Received from [facility]   Hunger Vital Sign    Within the past 12 months, you worried that your food would run out before you got the money to buy more.: Never true    Within the past 12 months, the food you bought just didn't last and you didn't have money to get more.: Never true  Transportation Needs: No Transportation Needs (11/15/2023)   Received from [facility] - Transportation    In the past 12 months, has lack of transportation kept you from medical appointments or from getting medications?: No    Lack of Transportation (Non-Medical): No  Physical Activity: Not on file  Stress: Not on file  Social Connections: Unknown (09/15/2021)   Received from [facility]   Social Network    Social Network: Not on file  Intimate Partner Violence: Not At Risk (09/27/2022)   Humiliation, Afraid, Rape, and Kick questionnaire    Fear of Current or Ex-Partner: No    Emotionally Abused: No    Physically Abused: No    Sexually Abused: No    FAMILY HISTORY: Family History  Problem Relation Age of Onset   Dementia Mother    Stroke Father    Atrial fibrillation Father    Hypertension Father    Hypertension Paternal Grandfather    Dementia Maternal Grandmother     ALLERGIES:  is allergic to octagam  [immune globulin ], linezolid , and vancomycin .  MEDICATIONS:  Current Outpatient Medications  Medication Sig Dispense Refill   loratadine  (CLARITIN ) 10 MG tablet Take 10 mg by mouth daily.     torsemide  (DEMADEX ) 20 MG tablet Take 1 tablet (20 mg total) by mouth daily. (Patient taking differently: Take 20 mg by mouth every other day.) 45 tablet 3   acyclovir  (ZOVIRAX ) 400 MG tablet Take 1 tablet (400 mg total) by mouth 2 (two) times daily. 60 tablet 5   albuterol  (VENTOLIN  HFA) 108 (90 Base) MCG/ACT inhaler Inhale 2 puffs into the lungs every 6 (six) hours as needed for  wheezing or shortness of breath. (Patient not taking: Reported on 12/27/2023) 8 g 0   amiodarone  (PACERONE ) 200 MG tablet Take 1 tablet (200 mg total) by mouth daily. 90 tablet 2   calcium  carbonate (TUMS - DOSED IN MG ELEMENTAL CALCIUM ) 500 MG chewable tablet Chew 1 tablet by mouth daily.     cholecalciferol  (VITAMIN D3) 25 MCG (1000 UNIT) tablet Take 1,000 Units by mouth daily.     Cyanocobalamin  (VITAMIN B-12 PO) Take 1,000 mcg by mouth daily.     docusate sodium  (COLACE) 100 MG capsule Take 1 capsule (100 mg total) by mouth every 12 (twelve) hours. 60 capsule 0   ELIQUIS  5 MG TABS tablet TAKE ONE TABLET BY MOUTH TWICE A DAY 60 tablet 6   fluticasone  (FLONASE ) 50 MCG/ACT nasal spray Place 1 spray into both nostrils daily as needed for allergies or rhinitis.     gabapentin  (NEURONTIN ) 300 MG capsule TAKE TWO CAPSULES BY MOUTH TWICE A DAY 360 capsule 1   hydrALAZINE  (APRESOLINE ) 100 MG tablet Take 1 tablet (100 mg total) by mouth 3 (three) times daily. 270 tablet 3   HYDROmorphone  (DILAUDID ) 2 MG tablet Take 2 mg by mouth every 4 (four) hours as needed for severe pain (pain score 7-10).     ketoconazole  (NIZORAL ) 2 % cream as needed for irritation.     levETIRAcetam  (KEPPRA ) 500 MG tablet Take 500 mg by mouth 2 (two) times daily.     metolazone  (ZAROXOLYN ) 2.5 MG tablet Take 1 tablet (2.5 mg total) by mouth every other day.  (Patient not taking: Reported on 12/27/2023)     polyethylene glycol (MIRALAX ) 17 g packet Take 17 g by mouth daily. 14 each 0   polyethylene glycol powder (GLYCOLAX /MIRALAX ) 17 GM/SCOOP powder Take 1 g by mouth daily.     potassium chloride  SA (KLOR-CON  M) 20 MEQ tablet TAKE TWO TABLETS BY MOUTH ONE TIME DAILY . THEN TAKE AN EXTRA TABLET ON THE DAY YOU TAKE THE METOLAZONE  180 tablet 3   sacubitril -valsartan  (ENTRESTO ) 49-51 MG Take 1 tablet by mouth 2 (two) times daily.     sodium chloride  (OCEAN) 0.65 % SOLN nasal spray Place 1 spray into both nostrils as needed for congestion.     spironolactone  (ALDACTONE ) 25 MG tablet Take 1 tablet (25 mg total) by mouth daily.     sulfamethoxazole -trimethoprim  (BACTRIM  DS) 800-160 MG tablet Take 1 tablet by mouth 3 (three) times a week.     No current facility-administered medications for this visit.    REVIEW OF SYSTEMS:   Constitutional: ( - ) fevers, ( - )  chills , ( - ) night sweats Eyes: ( - ) blurriness of vision, ( - ) double vision, ( - ) watery eyes Ears, nose, mouth, throat, and face: ( - ) mucositis, ( - ) sore throat Respiratory: ( - ) cough, ( - ) dyspnea, ( - ) wheezes Cardiovascular: ( - ) palpitation, ( - ) chest discomfort, ( - ) lower extremity swelling Gastrointestinal:  ( - ) nausea, ( - ) heartburn, ( - ) change in bowel habits Skin: ( - ) abnormal skin rashes Lymphatics: ( - ) new lymphadenopathy, ( - ) easy bruising Neurological: ( - ) numbness, ( - ) tingling, ( - ) new weaknesses Behavioral/Psych: ( - ) mood change, ( - ) new changes  All other systems were reviewed with the patient and are negative.  PHYSICAL EXAMINATION: ECOG PERFORMANCE STATUS: 1 - Symptomatic but completely ambulatory  Vitals:   12/28/23 1400  BP: (!) 156/82  Pulse: 65  Resp: 13  Temp: 98 F (36.7 C)  SpO2: 98%        There were no vitals filed for this visit.    GENERAL: Well-appearing elderly Caucasian male, alert, no distress and  comfortable SKIN: no overt abnormalities of the skin.  EYES: conjunctiva are pink and non-injected, sclera clear LUNGS: clear to auscultation and percussion with normal breathing effort HEART: tachycardic &  no murmurs. +bilateral trace lower extremity edema Musculoskeletal: no cyanosis of digits and no clubbing  PSYCH: alert & oriented x 3, fluent speech NEURO: no focal motor/sensory deficits  LABORATORY DATA:  I have reviewed the data as listed    Latest Ref Rng & Units 12/28/2023    1:26 PM 12/02/2023   10:05 AM 09/27/2023    3:20 AM  CBC  WBC 4.0 - 10.5 K/uL 5.7  5.9  10.2   Hemoglobin 13.0 - 17.0 g/dL 87.8  87.7  87.9   Hematocrit 39.0 - 52.0 % 36.1  35.9  36.9   Platelets 150 - 400 K/uL 173  138  189        Latest Ref Rng & Units 12/28/2023    1:26 PM 12/02/2023   10:05 AM 09/27/2023    3:20 AM  CMP  Glucose 70 - 99 mg/dL 86  99  894   BUN 8 - 23 mg/dL 16  12  23    Creatinine 0.61 - 1.24 mg/dL 9.33  9.28  9.18   Sodium 135 - 145 mmol/L 137  140  139   Potassium 3.5 - 5.1 mmol/L 3.6  3.3  3.7   Chloride 98 - 111 mmol/L 104  107  105   CO2 22 - 32 mmol/L 27  27  23    Calcium  8.9 - 10.3 mg/dL 8.7  8.8  8.9   Total Protein 6.5 - 8.1 g/dL 7.9  6.9  6.3   Total Bilirubin 0.0 - 1.2 mg/dL 0.2  0.3  0.4   Alkaline Phos 38 - 126 U/L 67  78  87   AST 15 - 41 U/L 18  20  32   ALT 0 - 44 U/L 14  17  38     Lab Results  Component Value Date   MPROTEIN Not Observed 12/02/2023   MPROTEIN Not Observed 06/08/2023   MPROTEIN Not Observed 05/11/2023   Lab Results  Component Value Date   KPAFRELGTCHN 1.0 (L) 12/28/2023   KPAFRELGTCHN 0.7 (L) 12/02/2023   KPAFRELGTCHN 221.7 (H) 06/08/2023   LAMBDASER <1.5 (L) 12/28/2023   LAMBDASER <1.5 (L) 12/02/2023   LAMBDASER 4.7 (L) 06/08/2023   KAPLAMBRATIO >0.67 12/28/2023   KAPLAMBRATIO >0.47 12/02/2023   KAPLAMBRATIO 47.17 (H) 06/08/2023     RADIOGRAPHIC STUDIES: VAS US  LOWER EXTREMITY VENOUS (DVT) Result Date: 12/09/2023  Lower  Venous DVT Study Patient Name:  Travis [name]  Date of Exam:   12/09/2023 Medical Rec #: [id number]         Accession #:    [id number] Date of Birth: [birth year]        Patient Gender: M Patient Age:   18 years Exam Location:  [address] Procedure:      VAS US  LOWER EXTREMITY VENOUS (DVT) Referring Phys: NORLEEN KIDNEY --------------------------------------------------------------------------------  Indications: Pain, and Swelling.  Risk Factors: Cancer Multiple myeloma obesity and Guillian-Barre. Anticoagulation: Eliquis . Performing Technologist: Alan Greenhouse RDMS, RVT, RDCS  Examination Guidelines: A complete evaluation includes B-mode imaging,  spectral Doppler, color Doppler, and power Doppler as needed of all accessible portions of each vessel. Bilateral testing is considered an integral part of a complete examination. Limited examinations for reoccurring indications may be performed as noted. The reflux portion of the exam is performed with the patient in reverse Trendelenburg.  +---------+---------------+---------+-----------+----------+--------------+ RIGHT    CompressibilityPhasicitySpontaneityPropertiesThrombus Aging +---------+---------------+---------+-----------+----------+--------------+ CFV      Full           Yes      Yes                                 +---------+---------------+---------+-----------+----------+--------------+ SFJ      Full           Yes      Yes                                 +---------+---------------+---------+-----------+----------+--------------+ FV Prox  Full           Yes      Yes                                 +---------+---------------+---------+-----------+----------+--------------+ FV Mid   Full           Yes      Yes                                 +---------+---------------+---------+-----------+----------+--------------+ FV DistalFull           Yes      Yes                                  +---------+---------------+---------+-----------+----------+--------------+ PFV      Full           Yes      Yes                                 +---------+---------------+---------+-----------+----------+--------------+ POP      Full           Yes      Yes                                 +---------+---------------+---------+-----------+----------+--------------+ PTV      Full           Yes      Yes                                 +---------+---------------+---------+-----------+----------+--------------+ PERO     Full           Yes      Yes                                 +---------+---------------+---------+-----------+----------+--------------+ Gastroc  Full           Yes      Yes                                 +---------+---------------+---------+-----------+----------+--------------+  GSV      Full           Yes      Yes                                 +---------+---------------+---------+-----------+----------+--------------+   +---------+---------------+---------+-----------+----------+--------------+ LEFT     CompressibilityPhasicitySpontaneityPropertiesThrombus Aging +---------+---------------+---------+-----------+----------+--------------+ CFV      Full           Yes      Yes                                 +---------+---------------+---------+-----------+----------+--------------+ SFJ      Full           Yes      Yes                                 +---------+---------------+---------+-----------+----------+--------------+ FV Prox  Full           Yes      Yes                                 +---------+---------------+---------+-----------+----------+--------------+ FV Mid   Full           Yes      Yes                                 +---------+---------------+---------+-----------+----------+--------------+ FV DistalFull           Yes      Yes                                  +---------+---------------+---------+-----------+----------+--------------+ PFV      Full           Yes      Yes                                 +---------+---------------+---------+-----------+----------+--------------+ POP      Full           Yes      Yes                                 +---------+---------------+---------+-----------+----------+--------------+ PTV      Full           Yes      Yes                                 +---------+---------------+---------+-----------+----------+--------------+ PERO     Full           Yes      Yes                                 +---------+---------------+---------+-----------+----------+--------------+ Gastroc  Full           Yes      Yes                                 +---------+---------------+---------+-----------+----------+--------------+  GSV      Full           Yes      Yes                                 +---------+---------------+---------+-----------+----------+--------------+     Summary: RIGHT: - No evidence of deep vein thrombosis in the lower extremity. No indirect evidence of obstruction proximal to the inguinal ligament.  - Fluid filled area seen on the anterior medial side of knee adjacent to patella.  LEFT: - No evidence of deep vein thrombosis in the lower extremity. No indirect evidence of obstruction proximal to the inguinal ligament.   *See table(s) above for measurements and observations. Electronically signed by Norman Serve on 12/09/2023 at 4:47:23 PM.    Final      ASSESSMENT & PLAN Travis [name] is a 73 y.o. male with medical history significant for IgA Kappa Multiple Myeloma who presents for a follow up visit.   # IgA Kappa Multiple Myeloma --Metastatic survey completed 12/15/2021 --Patient underwent fixation of his humerus on 12/17/2021, pathology confirmed plasmacytoma/plasma cell neoplasm.  --Received 6 cycles of Darzalex , Revlimid , and dexamethasone  started on 12/28/2021-05/18/2022.   --Due to worsening myeloma osseous lesions including pathologic fracture at T2 with cord compression, Dr. Federico recommends changing therapy to Velcade /Pomalyst /Dex.  --Due to rising kappa free light chains, recommend to change therapy to Carfilzomib /Dex started on 01/12/2023. Plan: --s/p CAR[facility] at [facility].  This occurred 06/27/2023 --labs from today showed white blood cell 5.7, Hgb 12.1, MCV 93.3, Plt 173  -- Based on last labs at [facility] patient was in a complete remission.  Will continue multiple myeloma labs every 4 weeks -- Will plan to administer IVIG if the patient has an IgG level of less than 400.  Additionally the patient is currently receiving monthly IVIG therapy for his Guillain-Barr  --RTC in 4 weeks for labs and clinic visits.  Once stable will proceed with every 4 week labs and every 12 week clinic visits.  # Neck pain and mid back pain: --Suspect pain is exacerbated from recent hospitalization and being in bed/sitting for long periods.  --MRI C/T/L spine obtinaed that showed unchanged lytic lesions in the right C7 inferior articular process,T6, T8, and T10  vertebral bodies, as well  as the T11 spinous process. Unchanged  multilevel cervical spondylsis, Interval enlargement of disc herniation at L3-L4 with progressive spinal canal stenosis. No new or progressive lytic lesions.  --Encouraged to continue with physical therapy and follow up with ortho.   #Palliative Radiation Therapy: # Dysphagia --Received palliative radiation from 01/06/2022-01/19/2022 to right hymerus, right forearm and right chest/rib, 20 Gy in 10 Fx.  --recieved palliative radiation to surgical bed in the upper thoracic spine area on 07/13/2022.  27 Gy in 9 Fx.   #Lower extremity edema--improved: --Currently on lasix  and torsemide   # Rash Concerning for Shingles-resolved -- Prescribed valacyclovir  3 times daily 1000 mg x 10 days-completed the course -- We will continue to  monitor rash closely.  #DVT: --Found to have acute DVT involving left peroneal veins. Likely secondary to recent surgery and hospitalization --Currently on eliquis  5 mg BID.    #Hypokalemia: --Potassium level is 3.6 today. Advised to continue with 40 mEq twice daily and increase to 60 mEq on the days he takes torsemide /spironolactone   #Rash on scalp and right forearm: --Recommend topical hydrocortisone cream for spot treatment -- Additionally  for his IVIG rash recommend Eucerin and Polysporin.  #Supportive Care -- chemotherapy education complete -- port placement not required -- zofran  8mg  q8H PRN and compazine  10mg  PO q6H for nausea -- acyclovir  400mg  PO BID for VCZ prophylaxis -- Will plan to restart Zometa  therapy as soon as is feasible  No orders of the defined types were placed in this encounter.  All questions were answered. The patient knows to call the clinic with any problems, questions or concerns.  A total of more than 30 minutes were spent on this encounter with face-to-face time and non-face-to-face time, including preparing to see the patient, ordering tests and/or medications, counseling the patient and coordination of care as outlined above.   Norleen IVAR Kidney, MD Department of Hematology/Oncology [facility] Phone: [phone] Pager: [phone] Email: [email or web address]  12/31/2023 6:51 PM

## 2023-12-29 LAB — KAPPA/LAMBDA LIGHT CHAINS
Kappa free light chain: 1 mg/L — ABNORMAL LOW (ref 3.3–19.4)
Kappa, lambda light chain ratio: >0.67 (ref 0.26–1.65)
Lambda free light chains: <1.5 mg/L — ABNORMAL LOW (ref 5.7–26.3)

## 2023-12-31 ENCOUNTER — Encounter: Payer: Self-pay | Admitting: Hematology and Oncology

## 2024-01-02 DIAGNOSIS — C9000 Multiple myeloma not having achieved remission: Secondary | ICD-10-CM | POA: Diagnosis not present

## 2024-01-02 DIAGNOSIS — D709 Neutropenia, unspecified: Secondary | ICD-10-CM | POA: Diagnosis not present

## 2024-01-02 DIAGNOSIS — R2689 Other abnormalities of gait and mobility: Secondary | ICD-10-CM | POA: Diagnosis not present

## 2024-01-02 DIAGNOSIS — M79675 Pain in left toe(s): Secondary | ICD-10-CM | POA: Diagnosis not present

## 2024-01-02 DIAGNOSIS — M79674 Pain in right toe(s): Secondary | ICD-10-CM | POA: Diagnosis not present

## 2024-01-02 DIAGNOSIS — B351 Tinea unguium: Secondary | ICD-10-CM | POA: Diagnosis not present

## 2024-01-02 DIAGNOSIS — I4891 Unspecified atrial fibrillation: Secondary | ICD-10-CM | POA: Diagnosis not present

## 2024-01-02 DIAGNOSIS — M6281 Muscle weakness (generalized): Secondary | ICD-10-CM | POA: Diagnosis not present

## 2024-01-02 DIAGNOSIS — R262 Difficulty in walking, not elsewhere classified: Secondary | ICD-10-CM | POA: Diagnosis not present

## 2024-01-02 DIAGNOSIS — G610 Guillain-Barre syndrome: Secondary | ICD-10-CM | POA: Diagnosis not present

## 2024-01-02 DIAGNOSIS — I4811 Longstanding persistent atrial fibrillation: Secondary | ICD-10-CM | POA: Diagnosis not present

## 2024-01-02 DIAGNOSIS — D472 Monoclonal gammopathy: Secondary | ICD-10-CM | POA: Diagnosis not present

## 2024-01-02 DIAGNOSIS — D801 Nonfamilial hypogammaglobulinemia: Secondary | ICD-10-CM | POA: Diagnosis not present

## 2024-01-02 DIAGNOSIS — Z7189 Other specified counseling: Secondary | ICD-10-CM | POA: Diagnosis not present

## 2024-01-02 DIAGNOSIS — R278 Other lack of coordination: Secondary | ICD-10-CM | POA: Diagnosis not present

## 2024-01-02 DIAGNOSIS — G629 Polyneuropathy, unspecified: Secondary | ICD-10-CM | POA: Diagnosis not present

## 2024-01-02 LAB — MULTIPLE MYELOMA PANEL, SERUM
Albumin SerPl Elph-Mcnc: 2.8 g/dL — ABNORMAL LOW (ref 2.9–4.4)
Albumin/Glob SerPl: 0.7 (ref 0.7–1.7)
Alpha 1: 0.3 g/dL (ref 0.0–0.4)
Alpha2 Glob SerPl Elph-Mcnc: 0.9 g/dL (ref 0.4–1.0)
B-Globulin SerPl Elph-Mcnc: 0.8 g/dL (ref 0.7–1.3)
Gamma Glob SerPl Elph-Mcnc: 2.4 g/dL — ABNORMAL HIGH (ref 0.4–1.8)
Globulin, Total: 4.5 g/dL — ABNORMAL HIGH (ref 2.2–3.9)
IgA: <5 mg/dL — ABNORMAL LOW (ref 61–437)
IgG (Immunoglobin G), Serum: 2972 mg/dL — ABNORMAL HIGH (ref 603–1613)
IgM (Immunoglobulin M), Srm: <5 mg/dL — ABNORMAL LOW (ref 15–143)
Total Protein ELP: 7.3 g/dL (ref 6.0–8.5)

## 2024-01-03 ENCOUNTER — Telehealth: Payer: Self-pay | Admitting: *Deleted

## 2024-01-03 NOTE — Telephone Encounter (Signed)
 Mr. Travis [name] left message - he asked to have lab cancelled on 9/18 since he's getting labs on 9/26 and the 18th isn't even a month from last labs. He is concerned that his immunoglobulin is very elevated and wanted to know if there are any implications related to that. He is also experiencing severe back itching and wants to know if it is related to receiving IVIG   Dr. Federico informed- he agreed to patient cancelling his lab appt on 9/18. He said the immunoglobulin level is elevated due to patient receiving IVIG. He said the back itching is not related to IVIG.   Contacted Mr. Travis [name] with Dr. Lafonda response. He verbalized understanding of all information. Appt for Labs on 9/18 cancelled.

## 2024-01-09 ENCOUNTER — Encounter: Payer: Self-pay | Admitting: Podiatry

## 2024-01-09 ENCOUNTER — Ambulatory Visit (INDEPENDENT_AMBULATORY_CARE_PROVIDER_SITE_OTHER): Admitting: Podiatry

## 2024-01-09 DIAGNOSIS — M79674 Pain in right toe(s): Secondary | ICD-10-CM

## 2024-01-09 DIAGNOSIS — M79675 Pain in left toe(s): Secondary | ICD-10-CM

## 2024-01-09 DIAGNOSIS — S93401A Sprain of unspecified ligament of right ankle, initial encounter: Secondary | ICD-10-CM

## 2024-01-09 DIAGNOSIS — B351 Tinea unguium: Secondary | ICD-10-CM

## 2024-01-11 NOTE — Progress Notes (Signed)
 Subjective:   Patient ID: Travis [name], male   DOB: 73 y.o.   MRN: [id number]   HPI Patient presents with caregiver stating his nails have gotten thick he cannot take care of them and he is being treated for cancer currently.  Patient does not smoke is not active and is in wheelchair current   Review of Systems  All other systems reviewed and are negative.       Objective:  Physical Exam Vitals and nursing note reviewed.  Constitutional:      Appearance: He is well-developed.  Pulmonary:     Effort: Pulmonary effort is normal.  Musculoskeletal:        General: Normal range of motion.  Skin:    General: Skin is warm.  Neurological:     Mental Status: He is alert.     Neurovascular status intact muscle strength found to be adequate range of motion within normal limits with patient noted to have elongated thickened nailbeds that are dystrophic irritative in the corners and patient has no ability to cut or this caregiver.  Patient is well-oriented and does have good digital perfusion     Assessment:  Patient with poor health and multiple myeloma currently under aggressive treatment at [facility] with mycotic nail infection 1-5 both feet     Plan:  H&P reviewed and discussed treatment options and at this point careful debridement of nailbeds with smoothing was accomplished and no iatrogenic bleeding.  This can be done as needed until he is able to regain his function to perform this himself with all questions answered today

## 2024-01-18 ENCOUNTER — Telehealth: Payer: Self-pay | Admitting: *Deleted

## 2024-01-18 NOTE — Telephone Encounter (Signed)
 TCT patient regarding his scheduled treatment for this week. Spoke with him. Advised that  due an authorization issue with the new IVIG formulation, we have to cancel his treatment for this week.  We should have the new authorization in 72 hours. Advised that our scheduler will be calling him with the new appt dates for, hopefully, next week. Pt voiced understanding and will cancel his transportation for next week.

## 2024-01-19 ENCOUNTER — Ambulatory Visit

## 2024-01-19 ENCOUNTER — Inpatient Hospital Stay

## 2024-01-20 ENCOUNTER — Inpatient Hospital Stay

## 2024-01-23 ENCOUNTER — Telehealth: Payer: Self-pay | Admitting: *Deleted

## 2024-01-23 DIAGNOSIS — G610 Guillain-Barre syndrome: Secondary | ICD-10-CM | POA: Diagnosis not present

## 2024-01-23 DIAGNOSIS — Z7189 Other specified counseling: Secondary | ICD-10-CM | POA: Diagnosis not present

## 2024-01-23 DIAGNOSIS — I4891 Unspecified atrial fibrillation: Secondary | ICD-10-CM | POA: Diagnosis not present

## 2024-01-23 NOTE — Telephone Encounter (Signed)
 TCT patient regarding his appts this week for IVIG. Spoke to him. Advised that we have had to move his appts again to next week due to insurance authorization issues. Advised that it appears these are resolving and he will be good for his IVIG next week.  Pt voiced understanding with new dates and times.

## 2024-01-26 ENCOUNTER — Inpatient Hospital Stay

## 2024-01-26 ENCOUNTER — Telehealth: Payer: Self-pay | Admitting: Hematology and Oncology

## 2024-01-26 ENCOUNTER — Telehealth: Payer: Self-pay | Admitting: *Deleted

## 2024-01-26 NOTE — Telephone Encounter (Signed)
 TCT patient regarding his appts for next week. Spoke with him. Advised that we are cancelling his IVIG for next week as his IgG  levels were quite high and does need the added IVIG.  Advised that we will get his lab work next week only  He voiced understanding. His visit with Dr. Marlo at [facility] in next week as well but it is a virtual visit. Advised that Dr. Federico will discuss future plans with Dr. Marlo after that visit. He again voiced understanding.

## 2024-01-27 ENCOUNTER — Inpatient Hospital Stay: Admitting: Physician Assistant

## 2024-01-27 ENCOUNTER — Ambulatory Visit

## 2024-01-27 ENCOUNTER — Other Ambulatory Visit

## 2024-02-01 ENCOUNTER — Telehealth: Payer: Self-pay | Admitting: *Deleted

## 2024-02-01 NOTE — Telephone Encounter (Signed)
 Received call from pt. He states he had his virtual visit with Dr. Marlo from [facility] yesterday. 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  Please advise

## 2024-02-02 ENCOUNTER — Inpatient Hospital Stay

## 2024-02-02 ENCOUNTER — Inpatient Hospital Stay: Attending: Physician Assistant

## 2024-02-02 ENCOUNTER — Telehealth: Payer: Self-pay | Admitting: Hematology and Oncology

## 2024-02-02 ENCOUNTER — Other Ambulatory Visit

## 2024-02-02 DIAGNOSIS — C9000 Multiple myeloma not having achieved remission: Secondary | ICD-10-CM | POA: Diagnosis present

## 2024-02-02 DIAGNOSIS — Z79899 Other long term (current) drug therapy: Secondary | ICD-10-CM | POA: Diagnosis not present

## 2024-02-02 LAB — CBC WITH DIFFERENTIAL (CANCER CENTER ONLY)
Abs Immature Granulocytes: 0.01 K/uL (ref 0.00–0.07)
Basophils Absolute: 0.1 K/uL (ref 0.0–0.1)
Basophils Relative: 1 %
Eosinophils Absolute: 0.1 K/uL (ref 0.0–0.5)
Eosinophils Relative: 2 %
HCT: 35.4 % — ABNORMAL LOW (ref 39.0–52.0)
Hemoglobin: 11.7 g/dL — ABNORMAL LOW (ref 13.0–17.0)
Immature Granulocytes: 0 %
Lymphocytes Relative: 6 %
Lymphs Abs: 0.4 K/uL — ABNORMAL LOW (ref 0.7–4.0)
MCH: 30.5 pg (ref 26.0–34.0)
MCHC: 33.1 g/dL (ref 30.0–36.0)
MCV: 92.4 fL (ref 80.0–100.0)
Monocytes Absolute: 0.8 K/uL (ref 0.1–1.0)
Monocytes Relative: 11 %
Neutro Abs: 5.5 K/uL (ref 1.7–7.7)
Neutrophils Relative %: 80 %
Platelet Count: 180 K/uL (ref 150–400)
RBC: 3.83 MIL/uL — ABNORMAL LOW (ref 4.22–5.81)
RDW: 14.3 % (ref 11.5–15.5)
WBC Count: 6.8 K/uL (ref 4.0–10.5)
nRBC: 0 % (ref 0.0–0.2)

## 2024-02-02 LAB — CMP (CANCER CENTER ONLY)
ALT: 16 U/L (ref 0–44)
AST: 21 U/L (ref 15–41)
Albumin: 3.8 g/dL (ref 3.5–5.0)
Alkaline Phosphatase: 86 U/L (ref 38–126)
Anion gap: 5 (ref 5–15)
BUN: 14 mg/dL (ref 8–23)
CO2: 30 mmol/L (ref 22–32)
Calcium: 9.3 mg/dL (ref 8.9–10.3)
Chloride: 106 mmol/L (ref 98–111)
Creatinine: 0.87 mg/dL (ref 0.61–1.24)
GFR, Estimated: >60 mL/min (ref >60)
Glucose, Bld: 83 mg/dL (ref 70–99)
Potassium: 3.8 mmol/L (ref 3.5–5.1)
Sodium: 141 mmol/L (ref 135–145)
Total Bilirubin: 0.2 mg/dL (ref 0.0–1.2)
Total Protein: 7 g/dL (ref 6.5–8.1)

## 2024-02-02 LAB — LACTATE DEHYDROGENASE: LDH: 178 U/L (ref 98–192)

## 2024-02-02 NOTE — Telephone Encounter (Signed)
 Left the patient a voicemail with the scheduled appointment details per secure chat.

## 2024-02-03 ENCOUNTER — Inpatient Hospital Stay

## 2024-02-03 LAB — KAPPA/LAMBDA LIGHT CHAINS
Kappa free light chain: 0.7 mg/L — ABNORMAL LOW (ref 3.3–19.4)
Kappa, lambda light chain ratio: >0.47 (ref 0.26–1.65)
Lambda free light chains: <1.5 mg/L — ABNORMAL LOW (ref 5.7–26.3)

## 2024-02-07 LAB — MULTIPLE MYELOMA PANEL, SERUM
Albumin SerPl Elph-Mcnc: 3.1 g/dL (ref 2.9–4.4)
Albumin/Glob SerPl: 1.1 (ref 0.7–1.7)
Alpha 1: 0.3 g/dL (ref 0.0–0.4)
Alpha2 Glob SerPl Elph-Mcnc: 0.9 g/dL (ref 0.4–1.0)
B-Globulin SerPl Elph-Mcnc: 0.9 g/dL (ref 0.7–1.3)
Gamma Glob SerPl Elph-Mcnc: 0.8 g/dL (ref 0.4–1.8)
Globulin, Total: 2.9 g/dL (ref 2.2–3.9)
IgA: 6 mg/dL — ABNORMAL LOW (ref 61–437)
IgG (Immunoglobin G), Serum: 992 mg/dL (ref 603–1613)
IgM (Immunoglobulin M), Srm: <5 mg/dL — ABNORMAL LOW (ref 15–143)
Total Protein ELP: 6 g/dL (ref 6.0–8.5)

## 2024-02-09 ENCOUNTER — Other Ambulatory Visit: Payer: Self-pay | Admitting: Hematology and Oncology

## 2024-02-09 ENCOUNTER — Inpatient Hospital Stay

## 2024-02-09 ENCOUNTER — Other Ambulatory Visit: Payer: Self-pay | Admitting: *Deleted

## 2024-02-09 VITALS — BP 131/73 | HR 63 | Temp 98.6°F | Resp 17 | Wt 241.4 lb

## 2024-02-09 DIAGNOSIS — C9000 Multiple myeloma not having achieved remission: Secondary | ICD-10-CM

## 2024-02-09 DIAGNOSIS — G610 Guillain-Barre syndrome: Secondary | ICD-10-CM

## 2024-02-09 MED ORDER — DIPHENHYDRAMINE HCL 25 MG PO CAPS
25.0000 mg | ORAL_CAPSULE | Freq: Once | ORAL | Status: AC
  Administered 2024-02-09: 25 mg via ORAL
  Filled 2024-02-09: qty 1

## 2024-02-09 MED ORDER — FAMOTIDINE IN NACL 20-0.9 MG/50ML-% IV SOLN
20.0000 mg | Freq: Once | INTRAVENOUS | Status: AC
  Administered 2024-02-09: 20 mg via INTRAVENOUS
  Filled 2024-02-09: qty 50

## 2024-02-09 MED ORDER — GAMMAGARD 10 GM/100ML IJ SOLN
1.0000 g/kg | Freq: Once | INTRAMUSCULAR | Status: AC
  Administered 2024-02-09: 110 g via INTRAVENOUS
  Filled 2024-02-09: qty 200

## 2024-02-09 MED ORDER — ACETAMINOPHEN 325 MG PO TABS
650.0000 mg | ORAL_TABLET | Freq: Once | ORAL | Status: AC
  Administered 2024-02-09: 650 mg via ORAL
  Filled 2024-02-09: qty 2

## 2024-02-09 NOTE — Patient Instructions (Signed)
 Immune Globulin  Injection What is this medication? IMMUNE GLOBULIN  (im MUNE GLOB yoo lin) treats many immune system conditions. It works by [employer], multimedia extra antibodies. Antibodies are proteins made by the immune system that help protect the body. This medicine may be used for other purposes; ask your health care provider or pharmacist if you have questions. COMMON BRAND NAME(S): ASCENIV, Baygam, BIVIGAM, Carimune, Carimune NF, cutaquig, Cuvitru, Flebogamma, Flebogamma DIF, GamaSTAN, GamaSTAN S/D, Gamimune N, Gammagard, Gammagard S/D, Gammaked, Gammaplex, Gammar-P IV, Gamunex, Gamunex-C, Hizentra, Iveegam, Iveegam EN, Octagam, Panglobulin, Panglobulin NF, panzyga, Polygam S/D, Privigen , Sandoglobulin, Venoglobulin-S, Vigam, Vivaglobulin, Xembify What should I tell my care team before I take this medication? They need to know if you have any of these conditions: Blood clotting disorder Condition where you have excess fluid in your body, such as heart failure or edema Dehydration Diabetes Have had blood clots Heart disease Immune system conditions Kidney disease Low levels of IgA Recent or upcoming vaccine An unusual or allergic reaction to immune globulin , other medications, foods, dyes, or preservatives Pregnant or trying to get pregnant Breastfeeding How should I use this medication? This medication is infused into a vein or under the skin. It may also be injected into a muscle. It is usually given by your care team in a hospital or clinic setting. It may also be given at home. If you get this medication at home, you will be taught how to prepare and give it. Take it as directed on the prescription label. Keep taking it unless your care team tells you to stop. It is important that you put your used needles and syringes in a special sharps container. Do not put them in a trash can. If you do not have a sharps container, call your pharmacist or care team to get one. Talk to your care team  about the use of this medication in children. While it may be given to children for selected conditions, precautions do apply. Overdosage: If you think you have taken too much of this medicine contact a poison control center or emergency room at once. NOTE: This medicine is only for you. Do not share this medicine with others. What if I miss a dose? If you get this medication at the hospital or clinic: It is important not to miss your dose. Call your care team if you are unable to keep an appointment. If you give yourself this medication at home: If you miss a dose, take it as soon as you can. Then continue your normal schedule. If it is almost time for your next dose, take only that dose. Do not take double or extra doses. Call your care team with questions. What may interact with this medication? Live virus vaccines This list may not describe all possible interactions. Give your health care provider a list of all the medicines, herbs, non-prescription drugs, or dietary supplements you use. Also tell them if you smoke, drink alcohol , or use illegal drugs. Some items may interact with your medicine. What should I watch for while using this medication? Your condition will be monitored carefully while you are receiving this medication. Tell your care team if your symptoms do not start to get better or if they get worse. You may need blood work done while you are taking this medication. This medication increases the risk of blood clots. People with heart, blood vessel, or blood clotting conditions are more likely to develop a blood clot. Other risk factors include advanced age, estrogen use, tobacco  use, lack of movement, and being overweight. This medication can decrease the response to a vaccine. If you need to get vaccinated, tell your care team if you have received this medication within the last year. Extra booster doses may be needed. Talk to your care team to see if a different vaccination schedule  is needed. This medication is made from donated human blood. There is a small risk it may contain bacteria or viruses, such as hepatitis or HIV. All products are processed to kill most bacteria and viruses. Talk to your care team if you have questions about the risk of infection. If you have diabetes, talk to your care team about which device you should use to check your blood sugar. This medication may cause some devices to report falsely high blood sugar levels. This may cause you to react by not treating a low blood sugar level or by giving an insulin  dose that was not needed. This can cause severe low blood sugar levels. What side effects may I notice from receiving this medication? Side effects that you should report to your care team as soon as possible: Allergic reactions--skin rash, itching, hives, swelling of the face, lips, tongue, or throat Blood clot--pain, swelling, or warmth in the leg, shortness of breath, chest pain Fever, neck pain or stiffness, sensitivity to light, headache, nausea, vomiting, confusion, which may be signs of meningitis Hemolytic anemia--unusual weakness or fatigue, dizziness, headache, trouble breathing, dark urine, yellowing skin or eyes Kidney injury--decrease in the amount of urine, swelling of the ankles, hands, or feet Low sodium level--muscle weakness, fatigue, dizziness, headache, confusion Shortness of breath or trouble breathing, cough, unusual weakness or fatigue, blue skin or lips Side effects that usually do not require medical attention (report these to your care team if they continue or are bothersome): Chills Diarrhea Fever Headache Nausea This list may not describe all possible side effects. Call your doctor for medical advice about side effects. You may report side effects to FDA at 1-800-FDA-1088. Where should I keep my medication? Keep out of the reach of children and pets. You will be instructed on how to store this medication. Get rid of  any unused medication after the expiration date. To get rid of medications that are no longer needed or have expired: Take the medication to a medication take-back program. Check with your pharmacy or law enforcement to find a location. If you cannot return the medication, ask your pharmacist or care team how to get rid of this medication safely. NOTE: This sheet is a summary. It may not cover all possible information. If you have questions about this medicine, talk to your doctor, pharmacist, or health care provider.  2025 Elsevier/Gold Standard (2023-07-04 00:00:00)

## 2024-02-10 ENCOUNTER — Inpatient Hospital Stay

## 2024-02-10 VITALS — BP 132/69 | HR 62 | Temp 98.9°F | Resp 16

## 2024-02-10 DIAGNOSIS — C9000 Multiple myeloma not having achieved remission: Secondary | ICD-10-CM | POA: Diagnosis not present

## 2024-02-10 MED ORDER — FAMOTIDINE IN NACL 20-0.9 MG/50ML-% IV SOLN
20.0000 mg | Freq: Once | INTRAVENOUS | Status: AC
  Administered 2024-02-10: 20 mg via INTRAVENOUS
  Filled 2024-02-10: qty 50

## 2024-02-10 MED ORDER — GAMMAGARD 10 GM/100ML IJ SOLN
1.0000 g/kg | INTRAMUSCULAR | Status: DC
  Administered 2024-02-10: 110 g via INTRAVENOUS
  Filled 2024-02-10: qty 900

## 2024-02-10 MED ORDER — DEXTROSE 5 % IV SOLN: INTRAVENOUS | Status: DC

## 2024-02-10 MED ORDER — ACETAMINOPHEN 325 MG PO TABS
650.0000 mg | ORAL_TABLET | Freq: Once | ORAL | Status: AC
  Administered 2024-02-10: 650 mg via ORAL
  Filled 2024-02-10: qty 2

## 2024-02-10 MED ORDER — DIPHENHYDRAMINE HCL 25 MG PO CAPS
25.0000 mg | ORAL_CAPSULE | Freq: Once | ORAL | Status: AC
  Administered 2024-02-10: 25 mg via ORAL
  Filled 2024-02-10: qty 1

## 2024-02-24 DIAGNOSIS — L89899 Pressure ulcer of other site, unspecified stage: Secondary | ICD-10-CM | POA: Diagnosis not present

## 2024-03-07 ENCOUNTER — Ambulatory Visit: Admitting: Podiatry

## 2024-03-07 ENCOUNTER — Encounter: Payer: Self-pay | Admitting: Podiatry

## 2024-03-07 DIAGNOSIS — L03031 Cellulitis of right toe: Secondary | ICD-10-CM

## 2024-03-07 DIAGNOSIS — L02611 Cutaneous abscess of right foot: Secondary | ICD-10-CM

## 2024-03-08 NOTE — Progress Notes (Signed)
 Subjective:   Patient ID: Travis [name], male   DOB: 73 y.o.   MRN: [id number]   HPI Patient presents with caregiver stating that his third toe right has become quite red and it did get traumatized but he is in a very frail health condition currently and is being treated for multiple myeloma.  Patient's nailbed do get traumatized and the hallux there is a small ulceration improving with patient using a product   ROS      Objective:  Physical Exam  Neurovascular status unchanged with redness of the third digit right digital deformities noted and break in tissue distal right hallux that is improving and measures approximately 4 x 4 mm no subcutaneous exposure currently     Assessment:  Possibility for a infection of the right third toe with patient just put on antibiotic by family physician versus trauma when it was hit inadvertently.  Ulceration right hallux improving     Plan:  H&P reviewed at this point I do think this is going to be something that should resolve with the antibiotic and there may be trauma to the toe versus any other pathology.  I did do courtesy debridement of nailbeds and I did remove the third nail as it was loose and flushed it and applied sterile dressing and patient will be seen back if redness does not resolve and I did discuss continued antibiotic usage at this time and patient had all questions answered

## 2024-03-20 ENCOUNTER — Inpatient Hospital Stay (HOSPITAL_COMMUNITY)
Admission: EM | Admit: 2024-03-20 | Discharge: 2024-03-22 | DRG: 309 | Disposition: A | Discharge status: Home Health | Attending: Family Medicine | Admitting: Family Medicine

## 2024-03-20 ENCOUNTER — Other Ambulatory Visit: Payer: Self-pay

## 2024-03-20 ENCOUNTER — Inpatient Hospital Stay (HOSPITAL_COMMUNITY)

## 2024-03-20 ENCOUNTER — Emergency Department (HOSPITAL_COMMUNITY)

## 2024-03-20 DIAGNOSIS — Z86718 Personal history of other venous thrombosis and embolism: Secondary | ICD-10-CM

## 2024-03-20 DIAGNOSIS — J210 Acute bronchiolitis due to respiratory syncytial virus: Secondary | ICD-10-CM | POA: Diagnosis present

## 2024-03-20 DIAGNOSIS — G4733 Obstructive sleep apnea (adult) (pediatric): Secondary | ICD-10-CM | POA: Diagnosis present

## 2024-03-20 DIAGNOSIS — I2489 Other forms of acute ischemic heart disease: Secondary | ICD-10-CM | POA: Diagnosis present

## 2024-03-20 DIAGNOSIS — Z96641 Presence of right artificial hip joint: Secondary | ICD-10-CM | POA: Diagnosis present

## 2024-03-20 DIAGNOSIS — C9000 Multiple myeloma not having achieved remission: Secondary | ICD-10-CM

## 2024-03-20 DIAGNOSIS — Z6835 Body mass index (BMI) 35.0-35.9, adult: Secondary | ICD-10-CM | POA: Diagnosis not present

## 2024-03-20 DIAGNOSIS — G8929 Other chronic pain: Secondary | ICD-10-CM | POA: Diagnosis present

## 2024-03-20 DIAGNOSIS — G610 Guillain-Barre syndrome: Secondary | ICD-10-CM | POA: Diagnosis present

## 2024-03-20 DIAGNOSIS — Z981 Arthrodesis status: Secondary | ICD-10-CM

## 2024-03-20 DIAGNOSIS — I110 Hypertensive heart disease with heart failure: Secondary | ICD-10-CM | POA: Diagnosis present

## 2024-03-20 DIAGNOSIS — Z59868 Other specified financial insecurity: Secondary | ICD-10-CM

## 2024-03-20 DIAGNOSIS — I480 Paroxysmal atrial fibrillation: Secondary | ICD-10-CM | POA: Diagnosis present

## 2024-03-20 DIAGNOSIS — Z888 Allergy status to other drugs, medicaments and biological substances status: Secondary | ICD-10-CM

## 2024-03-20 DIAGNOSIS — I77810 Thoracic aortic ectasia: Secondary | ICD-10-CM | POA: Diagnosis present

## 2024-03-20 DIAGNOSIS — E669 Obesity, unspecified: Secondary | ICD-10-CM | POA: Diagnosis present

## 2024-03-20 DIAGNOSIS — I5032 Chronic diastolic (congestive) heart failure: Secondary | ICD-10-CM | POA: Diagnosis present

## 2024-03-20 DIAGNOSIS — I4891 Unspecified atrial fibrillation: Principal | ICD-10-CM | POA: Diagnosis present

## 2024-03-20 DIAGNOSIS — I10 Essential (primary) hypertension: Secondary | ICD-10-CM | POA: Diagnosis not present

## 2024-03-20 DIAGNOSIS — Z7901 Long term (current) use of anticoagulants: Secondary | ICD-10-CM | POA: Diagnosis not present

## 2024-03-20 DIAGNOSIS — K219 Gastro-esophageal reflux disease without esophagitis: Secondary | ICD-10-CM | POA: Diagnosis present

## 2024-03-20 DIAGNOSIS — Z823 Family history of stroke: Secondary | ICD-10-CM

## 2024-03-20 DIAGNOSIS — E876 Hypokalemia: Secondary | ICD-10-CM | POA: Diagnosis present

## 2024-03-20 DIAGNOSIS — Z923 Personal history of irradiation: Secondary | ICD-10-CM

## 2024-03-20 DIAGNOSIS — D649 Anemia, unspecified: Secondary | ICD-10-CM | POA: Diagnosis present

## 2024-03-20 DIAGNOSIS — Z1152 Encounter for screening for COVID-19: Secondary | ICD-10-CM

## 2024-03-20 DIAGNOSIS — Z79899 Other long term (current) drug therapy: Secondary | ICD-10-CM

## 2024-03-20 DIAGNOSIS — Z881 Allergy status to other antibiotic agents status: Secondary | ICD-10-CM

## 2024-03-20 DIAGNOSIS — Z91148 Patient's other noncompliance with medication regimen for other reason: Secondary | ICD-10-CM

## 2024-03-20 DIAGNOSIS — I4892 Unspecified atrial flutter: Secondary | ICD-10-CM | POA: Diagnosis present

## 2024-03-20 DIAGNOSIS — T501X6A Underdosing of loop [high-ceiling] diuretics, initial encounter: Secondary | ICD-10-CM | POA: Diagnosis present

## 2024-03-20 DIAGNOSIS — C9001 Multiple myeloma in remission: Secondary | ICD-10-CM | POA: Diagnosis present

## 2024-03-20 DIAGNOSIS — Z8249 Family history of ischemic heart disease and other diseases of the circulatory system: Secondary | ICD-10-CM

## 2024-03-20 DIAGNOSIS — Z8701 Personal history of pneumonia (recurrent): Secondary | ICD-10-CM

## 2024-03-20 LAB — URINALYSIS, ROUTINE W REFLEX MICROSCOPIC
Bilirubin Urine: NEGATIVE
Glucose, UA: NEGATIVE mg/dL
Hgb urine dipstick: NEGATIVE
Ketones, ur: 5 mg/dL — AB
Leukocytes,Ua: NEGATIVE
Nitrite: NEGATIVE
Protein, ur: 30 mg/dL — AB
Specific Gravity, Urine: 1.014 (ref 1.005–1.030)
pH: 5 (ref 5.0–8.0)

## 2024-03-20 LAB — ECHOCARDIOGRAM COMPLETE
AR max vel: 3.98 cm2
AV Peak grad: 5.7 mmHg
Ao pk vel: 1.2 m/s
Area-P 1/2: 4.86 cm2
Height: 70 in
S' Lateral: 3 cm
Weight: 3876.57 [oz_av]

## 2024-03-20 LAB — BASIC METABOLIC PANEL WITH GFR
Anion gap: 14 (ref 5–15)
BUN: 12 mg/dL (ref 8–23)
CO2: 21 mmol/L — ABNORMAL LOW (ref 22–32)
Calcium: 7.8 mg/dL — ABNORMAL LOW (ref 8.9–10.3)
Chloride: 101 mmol/L (ref 98–111)
Creatinine, Ser: 0.82 mg/dL (ref 0.61–1.24)
GFR, Estimated: >60 mL/min (ref >60)
Glucose, Bld: 95 mg/dL (ref 70–99)
Potassium: 3.3 mmol/L — ABNORMAL LOW (ref 3.5–5.1)
Sodium: 136 mmol/L (ref 135–145)

## 2024-03-20 LAB — CBC WITH DIFFERENTIAL/PLATELET
Abs Immature Granulocytes: 0.04 K/uL (ref 0.00–0.07)
Basophils Absolute: 0.1 K/uL (ref 0.0–0.1)
Basophils Relative: 1 %
Eosinophils Absolute: 0 K/uL (ref 0.0–0.5)
Eosinophils Relative: 0 %
HCT: 36.9 % — ABNORMAL LOW (ref 39.0–52.0)
Hemoglobin: 12 g/dL — ABNORMAL LOW (ref 13.0–17.0)
Immature Granulocytes: 0 %
Lymphocytes Relative: 3 %
Lymphs Abs: 0.3 K/uL — ABNORMAL LOW (ref 0.7–4.0)
MCH: 30.9 pg (ref 26.0–34.0)
MCHC: 32.5 g/dL (ref 30.0–36.0)
MCV: 95.1 fL (ref 80.0–100.0)
Monocytes Absolute: 1 K/uL (ref 0.1–1.0)
Monocytes Relative: 9 %
Neutro Abs: 9.9 K/uL — ABNORMAL HIGH (ref 1.7–7.7)
Neutrophils Relative %: 87 %
Platelets: 178 K/uL (ref 150–400)
RBC: 3.88 MIL/uL — ABNORMAL LOW (ref 4.22–5.81)
RDW: 15.9 % — ABNORMAL HIGH (ref 11.5–15.5)
WBC: 11.4 K/uL — ABNORMAL HIGH (ref 4.0–10.5)
nRBC: 0 % (ref 0.0–0.2)

## 2024-03-20 LAB — RESP PANEL BY RT-PCR (RSV, FLU A&B, COVID)  RVPGX2
Influenza A by PCR: NEGATIVE
Influenza B by PCR: NEGATIVE
Resp Syncytial Virus by PCR: POSITIVE — AB
SARS Coronavirus 2 by RT PCR: NEGATIVE

## 2024-03-20 LAB — LIPID PANEL
Cholesterol: 98 mg/dL (ref 0–200)
HDL: 43 mg/dL (ref >40)
LDL Cholesterol: 47 mg/dL (ref 0–99)
Total CHOL/HDL Ratio: 2.3 ratio
Triglycerides: 42 mg/dL (ref <150)
VLDL: 8 mg/dL (ref 0–40)

## 2024-03-20 LAB — TROPONIN I (HIGH SENSITIVITY)
Troponin I (High Sensitivity): 17 ng/L (ref <18)
Troponin I (High Sensitivity): 105 ng/L (ref <18)
Troponin I (High Sensitivity): 154 ng/L (ref <18)
Troponin I (High Sensitivity): 143 ng/L (ref <18)

## 2024-03-20 LAB — BRAIN NATRIURETIC PEPTIDE: B Natriuretic Peptide: 661 pg/mL — ABNORMAL HIGH (ref 0.0–100.0)

## 2024-03-20 LAB — TSH: TSH: 4.633 u[IU]/mL — ABNORMAL HIGH (ref 0.350–4.500)

## 2024-03-20 LAB — T4, FREE: Free T4: 1.17 ng/dL — ABNORMAL HIGH (ref 0.61–1.12)

## 2024-03-20 MED ORDER — DILTIAZEM HCL-DEXTROSE 125-5 MG/125ML-% IV SOLN (PREMIX)
5.0000 mg/h | INTRAVENOUS | Status: DC
  Administered 2024-03-20: 15 mg/h via INTRAVENOUS
  Administered 2024-03-20: 5 mg/h via INTRAVENOUS
  Filled 2024-03-20 (×2): qty 125

## 2024-03-20 MED ORDER — APIXABAN 5 MG PO TABS
5.0000 mg | ORAL_TABLET | Freq: Two times a day (BID) | ORAL | Status: DC
  Administered 2024-03-20 – 2024-03-22 (×5): 5 mg via ORAL
  Filled 2024-03-20 (×6): qty 1

## 2024-03-20 MED ORDER — ACETAMINOPHEN 500 MG PO TABS
500.0000 mg | ORAL_TABLET | Freq: Four times a day (QID) | ORAL | Status: AC | PRN
  Administered 2024-03-20 – 2024-03-21 (×4): 500 mg via ORAL
  Filled 2024-03-20 (×4): qty 1

## 2024-03-20 MED ORDER — AMIODARONE HCL 200 MG PO TABS
200.0000 mg | ORAL_TABLET | Freq: Every day | ORAL | Status: DC
  Administered 2024-03-20 – 2024-03-22 (×3): 200 mg via ORAL
  Filled 2024-03-20 (×3): qty 1

## 2024-03-20 MED ORDER — SPIRONOLACTONE 25 MG PO TABS
25.0000 mg | ORAL_TABLET | Freq: Every day | ORAL | Status: DC
  Administered 2024-03-20 – 2024-03-22 (×3): 25 mg via ORAL
  Filled 2024-03-20: qty 1
  Filled 2024-03-20: qty 2
  Filled 2024-03-20: qty 1

## 2024-03-20 MED ORDER — IPRATROPIUM-ALBUTEROL 0.5-2.5 (3) MG/3ML IN SOLN
3.0000 mL | Freq: Once | RESPIRATORY_TRACT | Status: AC
  Administered 2024-03-20: 3 mL via RESPIRATORY_TRACT
  Filled 2024-03-20: qty 3

## 2024-03-20 MED ORDER — DILTIAZEM LOAD VIA INFUSION
10.0000 mg | Freq: Once | INTRAVENOUS | Status: AC
  Administered 2024-03-20: 10 mg via INTRAVENOUS
  Filled 2024-03-20: qty 10

## 2024-03-20 MED ORDER — ACYCLOVIR 400 MG PO TABS
400.0000 mg | ORAL_TABLET | Freq: Two times a day (BID) | ORAL | Status: DC
  Administered 2024-03-20 – 2024-03-22 (×5): 400 mg via ORAL
  Filled 2024-03-20 (×7): qty 1

## 2024-03-20 MED ORDER — FLUTICASONE PROPIONATE 50 MCG/ACT NA SUSP
2.0000 | Freq: Every day | NASAL | Status: AC
  Administered 2024-03-20 – 2024-03-22 (×3): 2 via NASAL
  Filled 2024-03-20 (×2): qty 16

## 2024-03-20 MED ORDER — POTASSIUM CHLORIDE CRYS ER 20 MEQ PO TBCR
20.0000 meq | EXTENDED_RELEASE_TABLET | Freq: Two times a day (BID) | ORAL | Status: AC
  Administered 2024-03-20 (×2): 20 meq via ORAL
  Filled 2024-03-20 (×2): qty 1

## 2024-03-20 MED ORDER — METOPROLOL SUCCINATE ER 25 MG PO TB24
25.0000 mg | ORAL_TABLET | Freq: Every day | ORAL | Status: DC
  Administered 2024-03-20 – 2024-03-21 (×2): 25 mg via ORAL
  Filled 2024-03-20 (×2): qty 1

## 2024-03-20 MED ORDER — GABAPENTIN 300 MG PO CAPS
600.0000 mg | ORAL_CAPSULE | Freq: Two times a day (BID) | ORAL | Status: DC
  Administered 2024-03-20 – 2024-03-22 (×5): 600 mg via ORAL
  Filled 2024-03-20 (×5): qty 2

## 2024-03-20 MED ORDER — SALINE SPRAY 0.65 % NA SOLN
1.0000 | NASAL | Status: DC | PRN
  Administered 2024-03-20 – 2024-03-21 (×3): 1 via NASAL
  Filled 2024-03-20: qty 44

## 2024-03-20 MED ORDER — ASPIRIN 81 MG PO TBEC
81.0000 mg | DELAYED_RELEASE_TABLET | Freq: Once | ORAL | Status: AC
  Administered 2024-03-20: 81 mg via ORAL
  Filled 2024-03-20: qty 1

## 2024-03-20 MED ORDER — POLYETHYLENE GLYCOL 3350 17 G PO PACK: 17.0000 g | PACK | Freq: Every day | ORAL | Status: DC | PRN

## 2024-03-20 MED ORDER — PROCHLORPERAZINE EDISYLATE 10 MG/2ML IJ SOLN: 5.0000 mg | Freq: Four times a day (QID) | INTRAMUSCULAR | Status: DC | PRN

## 2024-03-20 MED ORDER — SACUBITRIL-VALSARTAN 49-51 MG PO TABS
1.0000 | ORAL_TABLET | Freq: Two times a day (BID) | ORAL | Status: DC
  Administered 2024-03-20 – 2024-03-22 (×5): 1 via ORAL
  Filled 2024-03-20 (×5): qty 1

## 2024-03-20 MED ORDER — DILTIAZEM HCL ER COATED BEADS 180 MG PO CP24
180.0000 mg | ORAL_CAPSULE | Freq: Every day | ORAL | Status: DC
  Administered 2024-03-20 – 2024-03-21 (×2): 180 mg via ORAL
  Filled 2024-03-20 (×2): qty 1

## 2024-03-20 MED ORDER — MELATONIN 5 MG PO TABS
5.0000 mg | ORAL_TABLET | Freq: Every evening | ORAL | Status: DC | PRN
  Administered 2024-03-20 – 2024-03-21 (×2): 5 mg via ORAL
  Filled 2024-03-20 (×2): qty 1

## 2024-03-20 MED ORDER — HYDRALAZINE HCL 50 MG PO TABS
50.0000 mg | ORAL_TABLET | Freq: Three times a day (TID) | ORAL | Status: DC
  Administered 2024-03-20 – 2024-03-22 (×7): 50 mg via ORAL
  Filled 2024-03-20 (×8): qty 1

## 2024-03-20 MED ORDER — IPRATROPIUM-ALBUTEROL 0.5-2.5 (3) MG/3ML IN SOLN: 3.0000 mL | RESPIRATORY_TRACT | Status: DC | PRN

## 2024-03-20 MED ORDER — GUAIFENESIN-DM 100-10 MG/5ML PO SYRP
5.0000 mL | ORAL_SOLUTION | ORAL | Status: DC | PRN
  Administered 2024-03-20 – 2024-03-22 (×8): 5 mL via ORAL
  Filled 2024-03-20 (×8): qty 5

## 2024-03-20 MED ORDER — TORSEMIDE 20 MG PO TABS
20.0000 mg | ORAL_TABLET | Freq: Every day | ORAL | Status: DC
  Administered 2024-03-20 – 2024-03-22 (×3): 20 mg via ORAL
  Filled 2024-03-20 (×3): qty 1

## 2024-03-20 NOTE — H&P (Addendum)
 History and Physical  Travis [name] FMW:[id number] DOB: [birth year] DOA: 03/20/2024  Referring physician: Dr. Nettie, EDP  PCP: Charlott Dorn LABOR, MD  Outpatient Specialists: Cardiology, Dr. Vina Gull  Patient coming from: Home.  Chief Complaint: Fluttering in the chest.  HPI: Travis [name] is a 73 y.o. 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  States he tried Afrin today and Flonase  a few days prior for nasal congestion.  Symptoms have been present since Friday, 03/16/2024.  Has had a persistent cough.  Denies subjective fevers or chills.  EMS was activated.  The patient was in A-fib with RVR.  Endorses compliance with his home medications including rate control agents.  In the ER, heart rate in the 140s.  Started on Cardizem  drip with improvement.  RSV by PCR positive.  Admitted by [facility], hospitalist service.  ED Course: Temperature 97.4.  BP 124/79, pulse 110, respiration rate 25, O2 saturation 96% on room air.  Review of Systems: Review of systems as noted in the HPI. All other systems reviewed and are negative.   Past Medical History:  Diagnosis Date   A-fib [facility])    Arthritis    comes and goes; mostly in hands, occasionally elbow (04/05/2017)   Complication of anesthesia    just wanted to sleep alot  for couple days S/P VARICOCELE OR (04/05/2017)   DVT (deep venous thrombosis) (HCC)    LLE   Dysrhythmia    PVCs    GERD (gastroesophageal reflux disease)    History of hiatal hernia    History of radiation therapy    Thoracic Spine- 07/13/22-07/23/22-Dr. Lynwood Nasuti    Hypertension    Impaired glucose tolerance    Multiple myeloma (HCC)    Obesity (BMI 30-39.9) 07/26/2016   OSA on CPAP 07/26/2016   Mild with AHI 12.5/hr now on CPAP at 14cm H2O   PAF (paroxysmal atrial fibrillation) (HCC)    s/p PVI Ablation in 2018 // Flecainide  Rx // Echo 8/21: EF 60-65, no RWMA, mild LVH, normal RV SF, moderate RVE, mild BAE, trivial MR, mild dilation of aortic root (40 mm)   Pneumonia    may have had walking pneumonia a few years ago (04/05/2017)   Pre-diabetes    Thrombophlebitis    Past Surgical History:  Procedure Laterality Date   ATRIAL FIBRILLATION ABLATION  04/05/2017   ATRIAL FIBRILLATION ABLATION N/A 04/05/2017   Procedure: ATRIAL FIBRILLATION ABLATION;  Surgeon: Kelsie Lynwood, MD;  Location: [facility];  Service: Cardiovascular;  Laterality: N/A;   BONE BIOPSY Right 12/17/2021   Procedure: BONE BIOPSY;  Surgeon: Cristy Bonner DASEN, MD;  Location: [facility];  Service: Orthopedics;  Laterality: Right;   COMPLETE RIGHT HIP REPLACMENT  01/19/2019   EVALUATION UNDER ANESTHESIA WITH HEMORRHOIDECTOMY N/A 02/24/2017   Procedure: EXAM UNDER ANESTHESIA WITH  HEMORRHOIDECTOMY;  Surgeon: Sheldon Standing, MD;  Location: [facility];  Service: General;  Laterality: N/A;   EXCISIONAL TOTAL HIP ARTHROPLASTY WITH ANTIBIOTIC SPACERS Right 09/25/2020   Procedure: EXCISIONAL TOTAL HIP ARTHROPLASTY WITH ANTIBIOTIC SPACERS;  Surgeon: Ernie Cough, MD;  Location: [facility];  Service: Orthopedics;  Laterality: Right;   FLEXIBLE  SIGMOIDOSCOPY N/A 04/15/2015   Procedure:  UNSEDATED FLEXIBLE SIGMOIDOSCOPY;  Surgeon: Gladis MARLA Louder, MD;  Location: [facility];  Service: Endoscopy;  Laterality: N/A;   HUMERUS IM NAIL Right 12/17/2021   Procedure: INTRAMEDULLARY (IM) NAIL HUMERAL;  Surgeon: Cristy Bonner DASEN, MD;  Location: [facility];  Service: Orthopedics;  Laterality: Right;   KNEE ARTHROSCOPY Left ~ 2016   POSTERIOR CERVICAL FUSION/FORAMINOTOMY N/A 06/04/2022    Procedure: Thoracic one - Thoracic Four Posterior lateral arthrodesis/ fusion and Thoracic two Corpectomy;  Surgeon: Gillie Duncans, MD;  Location: [facility];  Service: Neurosurgery;  Laterality: N/A;   REIMPLANTATION OF TOTAL HIP Right 12/25/2020   Procedure: REIMPLANTATION/REVISION OF RIGHT TOTAL HIP VERSUS REPEAT IRRIGATION AND DEBRIDEMENT;  Surgeon: Ernie Cough, MD;  Location: [facility];  Service: Orthopedics;  Laterality: Right;   TESTICLE SURGERY  1988   VARICOCELE   TONSILLECTOMY     VARICOSE VEIN SURGERY Bilateral     Social History:  reports that he has never smoked. He has never used smokeless tobacco. He reports current alcohol use of about 2.0 standard drinks of alcohol per week. He reports that he does not use drugs.   Allergies  Allergen Reactions   Octagam [Immune Globulin ] Shortness Of Breath and Other (See Comments)    Patient developed hip/back pain, SOB, rigors- see notes 05/13/23   Linezolid  Other (See Comments)    Burning tongue    Vancomycin  Rash    Other Reaction(s): rash, itching, fever, chills    Family History  Problem Relation Age of Onset   Dementia Mother    Stroke Father    Atrial fibrillation Father    Hypertension Father    Hypertension Paternal Grandfather    Dementia Maternal Grandmother       Prior to Admission medications   Medication Sig Start Date End Date Taking? Authorizing Provider  acyclovir  (ZOVIRAX ) 400 MG tablet Take 1 tablet (400 mg total) by mouth 2 (two) times daily. 09/19/23   Federico Norleen DASEN MADISON, MD  albuterol  (VENTOLIN  HFA) 108 [phone] Base) MCG/ACT inhaler Inhale 2 puffs into the lungs every 6 (six) hours as needed for wheezing or shortness of breath. 10/03/22   Shalhoub, Zachary PARAS, MD  amiodarone  (PACERONE ) 200 MG tablet Take 1 tablet (200 mg total) by mouth daily. 09/19/23   Okey Vina GAILS, MD  calcium  carbonate (TUMS - DOSED IN MG ELEMENTAL CALCIUM ) 500 MG chewable tablet Chew 1 tablet by mouth daily.    [provider]   cholecalciferol  (VITAMIN D3) 25 MCG (1000 UNIT) tablet Take 1,000 Units by mouth daily.    [provider]  Cyanocobalamin  (VITAMIN B-12 PO) Take 1,000 mcg by mouth daily.    [provider]  docusate sodium  (COLACE) 100 MG capsule Take 1 capsule (100 mg total) by mouth every 12 (twelve) hours. 09/22/23   Ula Prentice SAUNDERS, MD  ELIQUIS  5 MG TABS tablet TAKE ONE TABLET BY MOUTH TWICE A DAY 08/03/23   Okey Vina GAILS, MD  fluticasone  (FLONASE ) 50 MCG/ACT nasal spray Place 1 spray into both nostrils daily as needed for allergies or rhinitis.    [provider]  gabapentin  (NEURONTIN ) 300 MG capsule TAKE TWO CAPSULES BY MOUTH TWICE A DAY 05/10/23   Patel, Donika K, DO  hydrALAZINE  (APRESOLINE ) 100 MG tablet Take 1 tablet (100 mg total) by mouth 3 (three) times daily. 05/09/23   Okey Vina GAILS, MD  HYDROmorphone  (DILAUDID ) 2 MG tablet Take 2 mg by mouth every 4 (four)  hours as needed for severe pain (pain score 7-10).    [provider]  ketoconazole  (NIZORAL ) 2 % cream as needed for irritation. 10/23/22   [provider]  levETIRAcetam  (KEPPRA ) 500 MG tablet Take 500 mg by mouth 2 (two) times daily.    [provider]  loratadine  (CLARITIN ) 10 MG tablet Take 10 mg by mouth daily.    [provider]  metolazone  (ZAROXOLYN ) 2.5 MG tablet Take 1 tablet (2.5 mg total) by mouth every other day. 01/21/23   Okey Vina GAILS, MD  polyethylene glycol (MIRALAX ) 17 g packet Take 17 g by mouth daily. 09/22/23   Ula Prentice SAUNDERS, MD  polyethylene glycol powder (GLYCOLAX /MIRALAX ) 17 GM/SCOOP powder Take 1 g by mouth daily.    [provider]  potassium chloride  SA (KLOR-CON  M) 20 MEQ tablet TAKE TWO TABLETS BY MOUTH ONE TIME DAILY . THEN TAKE AN EXTRA TABLET ON THE DAY YOU TAKE THE METOLAZONE  09/15/23   Okey Vina GAILS, MD  sacubitril -valsartan  (ENTRESTO ) 49-51 MG Take 1 tablet by mouth 2 (two) times daily.    [provider]  sodium chloride  (OCEAN) 0.65 % SOLN  nasal spray Place 1 spray into both nostrils as needed for congestion.    [provider]  spironolactone  (ALDACTONE ) 25 MG tablet Take 1 tablet (25 mg total) by mouth daily. 01/27/23   Lucien Orren SAILOR, PA-C  sulfamethoxazole -trimethoprim  (BACTRIM  DS) 800-160 MG tablet Take 1 tablet by mouth 3 (three) times a week.    [provider]  torsemide  (DEMADEX ) 20 MG tablet Take 1 tablet (20 mg total) by mouth daily. Patient taking differently: Take 20 mg by mouth every other day. 12/27/23   Lucien Orren SAILOR, PA-C    Physical Exam: BP 132/67   Pulse 99   Temp (!) 97.4 F (36.3 C) (Oral)   Resp (!) 23   Ht 5' 10 (1.778 m)   Wt 110.7 kg   SpO2 96%   BMI 35.01 kg/m   General: 73 y.o. year-old male well developed well nourished in no acute distress.  Alert and oriented x3. Cardiovascular: Irregular rate and rhythm with no rubs or gallops.  No thyromegaly or JVD noted.  Trace lower extremity edema bilaterally.SABRA Respiratory: Clear to auscultation with no wheezes or rales. Good inspiratory effort. Abdomen: Soft nontender nondistended with normal bowel sounds x4 quadrants. Muskuloskeletal: No cyanosis or clubbing noted bilaterally Neuro: CN II-XII intact, strength, sensation, reflexes Skin: No ulcerative lesions noted or rashes Psychiatry: Judgement and insight appear normal. Mood is appropriate for condition and setting          Labs on Admission:  Basic Metabolic Panel: Recent Labs  Lab 03/20/24 0103  NA 136  K 3.3*  CL 101  CO2 21*  GLUCOSE 95  BUN 12  CREATININE 0.82  CALCIUM  7.8*   Liver Function Tests: No results for input(s): AST, ALT, ALKPHOS, BILITOT, PROT, ALBUMIN  in the last 168 hours. No results for input(s): LIPASE, AMYLASE in the last 168 hours. No results for input(s): AMMONIA in the last 168 hours. CBC: Recent Labs  Lab 03/20/24 0103  WBC 11.4*  NEUTROABS 9.9*  HGB 12.0*  HCT 36.9*  MCV 95.1  PLT 178   Cardiac Enzymes: No  results for input(s): CKTOTAL, CKMB, CKMBINDEX, TROPONINI in the last 168 hours.  BNP (last 3 results) No results for input(s): BNP in the last 8760 hours.  ProBNP (last 3 results) No results for input(s): PROBNP in the last 8760 hours.  CBG: No results for input(s): GLUCAP in the last 168 hours.  Radiological Exams on Admission: DG Chest Portable 1 View Result Date: 03/20/2024 EXAM: 1 VIEW(S) XRAY OF THE CHEST 03/20/2024 01:27:00 AM COMPARISON: None available. CLINICAL HISTORY: afibetc FINDINGS: LUNGS AND PLEURA: No focal pulmonary opacity. No pleural effusion. No pneumothorax. HEART AND MEDIASTINUM: No acute abnormality of the cardiac and mediastinal silhouettes. BONES AND SOFT TISSUES: Cervicothoracic hardware noted. Osseous structures are age appropriate. Mixed lytic and sclerotic expansile lesion within the right sixth rib posteriorly again noted. IMPRESSION: 1. No acute findings. Electronically signed by: Dorethia Molt MD 03/20/2024 01:41 AM EST RP Workstation: [id number]    EKG: I independently viewed the EKG done and my findings are as followed: Atrial fibrillation rate of 147.  QTc 433.  Assessment/Plan Present on Admission:  Atrial fibrillation with RVR (HCC)  Principal Problem:   Atrial fibrillation with RVR (HCC)  Paroxysmal A-fib with RVR Presented with heart rate in the 140s Continue Cardizem  drip Resume home amiodarone  Resume home Eliquis  for CVA prevention Continue to monitor on telemetry Follow transthoracic echocardiogram Follow TSH Consult cardiology in the morning  Elevated troponin, suspect demand ischemia in the setting of tachycardia, A-fib with RVR Troponin 17, repeat 105, trend Added aspirin  81 mg x 1 since he is currently on Eliquis . Adding fasting lipid panel, follow LDL and add statin if indicated. Not on statin prior to admission. Follow-up transthoracic echocardiogram Continue telemetry Consult cardiology in the morning  RSV  infection, POA Droplet and contact precautions Continue supportive care Flonase  for nasal congestion Incentive spirometer As needed DuoNebs for wheezing and shortness of breath.  Mild leukocytosis with neutrophilia Chest x-ray was nonacute Follow UA  Hypokalemia Serum potassium 3.3 Repleted orally Follow magnesium  level, replete electrolytes as indicated.  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  Polyneuropathy Resume home gabapentin  Continue fall precautions.  Chronic HFpEF Euvolemic on exam Monitor strict I's and O's and daily weight  OSA CPAP nightly  Multiple myeloma Outpatient follow-up with medical oncology Resume home regimen and prophylactic medications  Obesity BMI 35 Recommend weight loss outpatient with regular physical activity and healthy dieting.    Critical care time: 55 minutes.    DVT prophylaxis: Home Eliquis  5 mg twice daily  Code Status: Full code.  Family Communication: None at bedside.  Disposition Plan: Admitted to progressive care unit.  Consults called: None.  Consult cardiology in the morning.  Admission status: Inpatient status.   Status is: Inpatient The patient requires at least 2 midnights for further evaluation and treatment of present condition.   Terry LOISE Hurst MD [facility] Pager [phone]  If 7PM-7AM, please contact night-coverage [email or web address] Password [email or web address]  03/20/2024, 5:25 AM

## 2024-03-20 NOTE — Consult Note (Signed)
 Cardiology Consultation  Patient ID: Travis [name] MRN: [id number]; DOB: 10/03/1950  Admit date: 03/20/2024 Date of Consult: 03/20/2024  PCP:  Charlott Dorn LABOR, MD   [facility] Providers Cardiologist:  Vina Gull, MD  Electrophysiologist:  OLE ONEIDA HOLTS, MD  Sleep Medicine:  Wilbert Bihari, MD    Patient Profile: Travis [name] is a 73 y.o. 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 Vernon.  History of Present Illness: Travis [name] has past medical history as stated above. He presented to [facility] ED on 03/20/2024 via EMS for an irregular rhythm. He reported feeling a fluttering sensation in his chest for about one hour prior to arrival. He reported that it had been about 2 years since he had any documented recurrence of A-Fib. He did report cold symptoms for about 1 week.   Relevant workup while in the ER includes: respiratory panel positive for RSV, CBC showed leukocytosis with WBC 11.4k with elevated neutrophils and chronic anemia. BMP showed hypokalemia with K at 3.3, TSH elevated with T4/T3 pending, troponin level 17 ? 105 ? 154 ? 143. EKG showed A-Fib with HR 103. CXR with no acute findings. Pending echocardiogram.  He has been admitted to the medicine service. Started on IV diltiazem , continued on home amiodarone  and Eliquis . Cardiology was asked to consult in the setting of elevated troponin levels along with A-Fib RVR.   Prior cardiac studies: Long-term monitor, 11/2022: 47% A-Fib burden, rare PVCs Echocardiogram, 09/2022: EF 60-65%, severe LVH, normal RV function, severe LAE, mild RAE, mild dilation of ascending aorta at 37mm, dilated IVC Coronary CTA  pre-ablation, 03/2017: calcium  score 0, no CAD Lexiscan , 02/2016: EF 60%, low risk study, no ischemia, no infarction   He is a patient of Dr. Gull, Dr. Holts and Dr. Bihari (sleep medicine) and follows closely with our office. He was last seen by Orren Fabry, PA-C in August 2025 for follow up. At this appointment he was overall doing well in terms of cardiac health. He was in [facility] re[facility] at the time after ending up with Guillain-Barr syndrome due to medications for his multiple myeloma.   His most recent medication regimen included: amiodarone  200 mg daily, Eliqusi 5 mg BID, hydralazine  100 mg TID, Entresto  49-51 mg BID, spironolactone  25 mg daily, Torsemide  20 mg every other day with 20 mEq K.   After speaking with the patient, he agrees with the history as stated above.  He tells me that he had family recently visit from [place] who are also feeling under the weather.  He lives at home with his wife.  Due to his Gillian-Barr syndrome and chronic pain/peripheral neuropathy he is unable to ambulate at home.  He tells me that he typically has a wheelchair that he is able to use to get around.  Due to his family being in town recently, he admits that he has not been taking his torsemide  as directed.  He typically supposed be taking torsemide  20 mg every other day.  However he tells me that he has been taking it about every 4 days.  He does admit that he has been gaining a few pounds daily.  He reports missing his evening medications just once on Friday, 03/16/2024, this  would include his Eliquis .  Otherwise he denies any missed doses.  He reports that he feels significantly improved compared to yesterday. He tells me that his GBS he is profoundly weak at home.  He says that when it comes time for him to get discharged home, he will need accommodations such as transport home as he is not going to be able to get out of the car into a wheelchair with only the assistance of his wife.  Past Medical  History:  Diagnosis Date   A-fib [facility])    Arthritis    comes and goes; mostly in hands, occasionally elbow (04/05/2017)   Complication of anesthesia    just wanted to sleep alot  for couple days S/P VARICOCELE OR (04/05/2017)   DVT (deep venous thrombosis) (HCC)    LLE   Dysrhythmia    PVCs    GERD (gastroesophageal reflux disease)    History of hiatal hernia    History of radiation therapy    Thoracic Spine- 07/13/22-07/23/22-Dr. Lynwood Nasuti   Hypertension    Impaired glucose tolerance    Multiple myeloma (HCC)    Obesity (BMI 30-39.9) 07/26/2016   OSA on CPAP 07/26/2016   Mild with AHI 12.5/hr now on CPAP at 14cm H2O   PAF (paroxysmal atrial fibrillation) (HCC)    s/p PVI Ablation in 2018 // Flecainide  Rx // Echo 8/21: EF 60-65, no RWMA, mild LVH, normal RV SF, moderate RVE, mild BAE, trivial MR, mild dilation of aortic root (40 mm)   Pneumonia    may have had walking pneumonia a few years ago (04/05/2017)   Pre-diabetes    Thrombophlebitis    Past Surgical History:  Procedure Laterality Date   ATRIAL FIBRILLATION ABLATION  04/05/2017   ATRIAL FIBRILLATION ABLATION N/A 04/05/2017   Procedure: ATRIAL FIBRILLATION ABLATION;  Surgeon: Kelsie Lynwood, MD;  Location: [facility];  Service: Cardiovascular;  Laterality: N/A;   BONE BIOPSY Right 12/17/2021   Procedure: BONE BIOPSY;  Surgeon: Cristy Bonner DASEN, MD;  Location: [facility];  Service: Orthopedics;  Laterality: Right;   COMPLETE RIGHT HIP REPLACMENT  01/19/2019   EVALUATION UNDER ANESTHESIA WITH HEMORRHOIDECTOMY N/A 02/24/2017   Procedure: EXAM UNDER ANESTHESIA WITH  HEMORRHOIDECTOMY;  Surgeon: Sheldon Standing, MD;  Location: [facility];  Service: General;  Laterality: N/A;   EXCISIONAL TOTAL HIP ARTHROPLASTY WITH ANTIBIOTIC SPACERS Right 09/25/2020   Procedure: EXCISIONAL TOTAL HIP ARTHROPLASTY WITH ANTIBIOTIC SPACERS;  Surgeon: Ernie Cough, MD;  Location: [facility];  Service: Orthopedics;  Laterality: Right;    FLEXIBLE SIGMOIDOSCOPY N/A 04/15/2015   Procedure:  UNSEDATED FLEXIBLE SIGMOIDOSCOPY;  Surgeon: Gladis MARLA Louder, MD;  Location: [facility];  Service: Endoscopy;  Laterality: N/A;   HUMERUS IM NAIL Right 12/17/2021   Procedure: INTRAMEDULLARY (IM) NAIL HUMERAL;  Surgeon: Cristy Bonner DASEN, MD;  Location: [facility];  Service: Orthopedics;  Laterality: Right;   KNEE ARTHROSCOPY Left ~ 2016   POSTERIOR CERVICAL FUSION/FORAMINOTOMY N/A 06/04/2022   Procedure: Thoracic one - Thoracic Four Posterior lateral arthrodesis/ fusion and Thoracic two Corpectomy;  Surgeon: Gillie Duncans, MD;  Location: [facility];  Service: Neurosurgery;  Laterality: N/A;   REIMPLANTATION OF TOTAL HIP Right 12/25/2020   Procedure: REIMPLANTATION/REVISION OF RIGHT TOTAL HIP VERSUS REPEAT IRRIGATION AND DEBRIDEMENT;  Surgeon: Ernie Cough, MD;  Location: [facility];  Service: Orthopedics;  Laterality: Right;   TESTICLE SURGERY  1988   VARICOCELE   TONSILLECTOMY     VARICOSE VEIN SURGERY Bilateral  Home Medications:  Prior to Admission medications   Medication Sig Start Date End Date Taking? Authorizing Provider  acyclovir  (ZOVIRAX ) 400 MG tablet Take 1 tablet (400 mg total) by mouth 2 (two) times daily. 09/19/23   Federico Norleen ONEIDA MADISON, MD  albuterol  (VENTOLIN  HFA) 108 [phone] Base) MCG/ACT inhaler Inhale 2 puffs into the lungs every 6 (six) hours as needed for wheezing or shortness of breath. 10/03/22   Shalhoub, Zachary PARAS, MD  amiodarone  (PACERONE ) 200 MG tablet Take 1 tablet (200 mg total) by mouth daily. 09/19/23   Okey Vina GAILS, MD  calcium  carbonate (TUMS - DOSED IN MG ELEMENTAL CALCIUM ) 500 MG chewable tablet Chew 1 tablet by mouth daily.    [provider]  cholecalciferol  (VITAMIN D3) 25 MCG (1000 UNIT) tablet Take 1,000 Units by mouth daily.    [provider]  Cyanocobalamin  (VITAMIN B-12 PO) Take 1,000 mcg by mouth daily.    [provider]  docusate sodium  (COLACE) 100 MG capsule Take 1  capsule (100 mg total) by mouth every 12 (twelve) hours. 09/22/23   Ula Prentice SAUNDERS, MD  ELIQUIS  5 MG TABS tablet TAKE ONE TABLET BY MOUTH TWICE A DAY 08/03/23   Okey Vina GAILS, MD  fluticasone  (FLONASE ) 50 MCG/ACT nasal spray Place 1 spray into both nostrils daily as needed for allergies or rhinitis.    [provider]  gabapentin  (NEURONTIN ) 300 MG capsule TAKE TWO CAPSULES BY MOUTH TWICE A DAY 05/10/23   Patel, Donika K, DO  hydrALAZINE  (APRESOLINE ) 100 MG tablet Take 1 tablet (100 mg total) by mouth 3 (three) times daily. 05/09/23   Okey Vina GAILS, MD  HYDROmorphone  (DILAUDID ) 2 MG tablet Take 2 mg by mouth every 4 (four) hours as needed for severe pain (pain score 7-10).    [provider]  ketoconazole  (NIZORAL ) 2 % cream as needed for irritation. 10/23/22   [provider]  levETIRAcetam  (KEPPRA ) 500 MG tablet Take 500 mg by mouth 2 (two) times daily.    [provider]  loratadine  (CLARITIN ) 10 MG tablet Take 10 mg by mouth daily.    [provider]  metolazone  (ZAROXOLYN ) 2.5 MG tablet Take 1 tablet (2.5 mg total) by mouth every other day. 01/21/23   Okey Vina GAILS, MD  polyethylene glycol (MIRALAX ) 17 g packet Take 17 g by mouth daily. 09/22/23   Ula Prentice SAUNDERS, MD  polyethylene glycol powder (GLYCOLAX /MIRALAX ) 17 GM/SCOOP powder Take 1 g by mouth daily.    [provider]  potassium chloride  SA (KLOR-CON  M) 20 MEQ tablet TAKE TWO TABLETS BY MOUTH ONE TIME DAILY . THEN TAKE AN EXTRA TABLET ON THE DAY YOU TAKE THE METOLAZONE  09/15/23   Okey Vina GAILS, MD  sacubitril -valsartan  (ENTRESTO ) 49-51 MG Take 1 tablet by mouth 2 (two) times daily.    [provider]  sodium chloride  (OCEAN) 0.65 % SOLN nasal spray Place 1 spray into both nostrils as needed for congestion.    [provider]  spironolactone  (ALDACTONE ) 25 MG tablet Take 1 tablet (25 mg total) by mouth daily. 01/27/23   Lucien Orren SAILOR, PA-C  sulfamethoxazole -trimethoprim  (BACTRIM  DS)  800-160 MG tablet Take 1 tablet by mouth 3 (three) times a week.    [provider]  torsemide  (DEMADEX ) 20 MG tablet Take 1 tablet (20 mg total) by mouth daily. Patient taking differently: Take 20 mg by mouth every other day. 12/27/23   Lucien Orren SAILOR, PA-C   Scheduled Meds:  acyclovir   400 mg Oral BID   amiodarone   200 mg Oral Daily   apixaban   5 mg Oral BID   fluticasone   2 spray Each Nare Daily   gabapentin   600 mg Oral BID   Continuous Infusions:  diltiazem  (CARDIZEM ) infusion 15 mg/hr (03/20/24 1016)   PRN Meds: acetaminophen , guaiFENesin -dextromethorphan, ipratropium-albuterol , melatonin, polyethylene glycol, prochlorperazine   Allergies:    Allergies  Allergen Reactions   Octagam [Immune Globulin ] Shortness Of Breath and Other (See Comments)    Patient developed hip/back pain, SOB, rigors- see notes 05/13/23   Linezolid  Other (See Comments)    Burning tongue    Vancomycin  Rash    Other Reaction(s): rash, itching, fever, chills   Social History:   Social History   Socioeconomic History   Marital status: Married    Spouse name: Not on file   Number of children: 1   Years of education: law school   Highest education level: Not on file  Occupational History   Occupation: lawyer  Tobacco Use   Smoking status: Never   Smokeless tobacco: Never  Vaping Use   Vaping status: Never Used  Substance and Sexual Activity   Alcohol use: Yes    Alcohol/week: 2.0 standard drinks of alcohol    Types: 2 Glasses of wine per week    Comment: 2 glasses of wine a week, occ bourbon, beer   Drug use: No   Sexual activity: Not Currently  Other Topics Concern   Not on file  Social History Narrative   Lives in [place] with spouse in a 2 story home.  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 1 daughter.     Education: social worker school.    Social Drivers of Corporate Investment Banker Strain: High Risk (11/15/2023)   Received from [facility]   Overall Financial Resource Strain (CARDIA)    Difficulty of Paying Living Expenses: Very hard  Food Insecurity: No Food Insecurity (11/15/2023)   Received from [facility]   Hunger Vital Sign    Within the past 12 months, you worried that your food would run out before you got the money to buy more.: Never true    Within the past 12 months, the food you bought just didn't last and you didn't have money to get more.: Never true  Transportation Needs: No Transportation Needs (11/15/2023)   Received from [facility] - Transportation    In the past 12 months, has lack of transportation kept you from medical appointments or from getting medications?: No    Lack of Transportation (Non-Medical): No  Physical Activity: Not on file  Stress: Not on file  Social Connections: Unknown (09/15/2021)   Received from [facility]   Social Network    Social Network: Not on file  Intimate Partner Violence: Not At Risk (09/27/2022)   Humiliation, Afraid, Rape, and Kick questionnaire    Fear of Current or Ex-Partner: No    Emotionally Abused: No    Physically Abused: No    Sexually Abused: No    Family History:   Family History  Problem Relation Age of Onset   Dementia Mother    Stroke Father    Atrial fibrillation Father    Hypertension Father    Hypertension Paternal Grandfather    Dementia Maternal Grandmother     ROS:  Please see the history of present illness.  All other ROS reviewed and negative.  Physical Exam/Data: Vitals:   03/20/24 1030 03/20/24 1045 03/20/24 1100 03/20/24 1115  BP:   (!) 135/94   Pulse: (!) 120 (!) 121 (!) 120 (!) 121  Resp: (!) 24 (!) 25 (!) 21 (!) 24  Temp:      TempSrc:      SpO2: 96% 95% 95% 96%  Weight:      Height:        Intake/Output Summary (Last 24 hours) at 03/20/2024 1207 Last data filed at 03/20/2024 0800 Gross per 24 hour  Intake 566.85 ml  Output 100 ml  Net 466.85 ml       03/20/2024   12:49 AM 02/09/2024    9:41 AM 12/27/2023    1:15 PM  Last 3 Weights  Weight (lbs) 244 lb 241 lb 7 oz 241 lb  Weight (kg) 110.678 kg 109.515 kg 109.317 kg     Body mass index is 35.01 kg/m.   General: Chronically ill-appearing, in no acute distress HEENT: normal Neck: Difficult to assess JVD Vascular: Distal pulses 2+ bilaterally Cardiac:  normal S1, S2; tachycardic; no murmur  Lungs:  decreased breath sounds   Abd: soft, nontender, no hepatomegaly  Ext: 1+ LE edema Musculoskeletal:  No deformities, strength at baseline per patient Skin: warm and dry  Psych:  Normal affect   EKG:  The EKG was personally reviewed and demonstrates:  A-Fib with HR 103  Telemetry:  Telemetry was personally reviewed and demonstrates: Atrial flutter, HR 120s  Relevant CV Studies:  Echocardiogram, 03/20/2024 Ordered, pending results  Long term cardiac monitor, 12/01/2022 Rhythms:   Sinus rhythm and atrial fibrillation (47% burden  Longest episode for 1 day, 10 hours 23 min on 11/15/22)  Heart rates 42 to 158 bpms   Average HR 76 bpm.  Rare PVCs Couple triplets       Triggered events corresponded to Sinus rhythm and atrial fibrillation  None were at extreme heart rates    The Maximum Heart Rate recorded was 158 bpm, 07/ 19 19: 41: 59, the Minimum Heart Rate recorded was 42 bpm, 07/ 22 03: 22: 34, and the Average Heart Rate was 76 bpm. * There were 6, 934 VE beats with a burden of < 1 % . There was 1 occurrence of Ventricular Tachycardia with the Fastest episode 158 bpm, 07/ 19 19: 41: 59, and the Longest episode 3 beats, 07/ 19 19: 41: 59. * There were 7, 614 SVE beats with a burden of < 1 % . There were 41 occurrences of Supraventricular Tachycardia with the Fastest episode 129 bpm, 07/ 16 20: 46: 35, and the Longest episode 7 beats, 07/ 18 22: 21: 21. * The study included an Atrial Fibrillation/ Flutter Burden of 47 % with 38 % in RVR and < 1 % in SVR. The longest episode was 1d 10h 71m 50.  4s, 07/ 15 09: 38: 44, and the Fastest episode was 152 bpm, 07/ 12 01: 18: 52. * Other rhythms in this study include: Ventricular Run.   Echocardiogram, 06/22/2022 Left ventricular ejection fraction, by estimation, is 60 to 65% . The left ventricle has normal function. The left ventricle has no regional wall motion abnormalities. There is severe concentric left ventricular hypertrophy. Left ventricular diastolic parameters are indeterminate.  Right ventricular systolic function is normal. The right ventricular size is mildly enlarged. There is normal pulmonary artery systolic pressure. Left atrial size was severely dilated.  Right atrial size was mildly dilated.  The mitral valve is normal in  structure. No evidence of mitral valve regurgitation. No evidence of mitral stenosis.  The aortic valve is normal in structure. Aortic valve regurgitation is not visualized. No aortic stenosis is present.  There is mild dilatation of the ascending aorta, measuring 37 mm.  The inferior vena cava is dilated in size with < 50% respiratory variability, suggesting right atrial pressure of 15 mmHg.  Coronary CTA pre-ablation, 03/31/2017 There is normal pulmonary vein drainage into the left atrium. The left atrial appendage is large with chicken wing morphology and one major lobe. Ostial size 27 x 21 mm and length 42 mm. There is no thrombus in the left atrial appendage. The esophagus runs to the left from the left atrial midline and is in the proximity to the LLPV. Normal coronary origin. Right dominance. Calcium  score 0. No evidence of CAD.  Lexiscan , 02/18/2016 Nuclear stress EF: 60%. Blood pressure demonstrated a normal response to exercise. There was no ST segment deviation noted during stress. The study is normal. This is a low risk study. The left ventricular ejection fraction is normal (55-65%).   Normal resting and stress perfusion. No ischemia or infarction EF 60%   Laboratory Data: High  Sensitivity Troponin:   Recent Labs  Lab 03/20/24 0103 03/20/24 0448 03/20/24 0654 03/20/24 0811  TROPONINIHS 17 105* 154* 143*     Chemistry Recent Labs  Lab 03/20/24 0103  NA 136  K 3.3*  CL 101  CO2 21*  GLUCOSE 95  BUN 12  CREATININE 0.82  CALCIUM  7.8*  GFRNONAA >60  ANIONGAP 14    No results for input(s): PROT, ALBUMIN , AST, ALT, ALKPHOS, BILITOT in the last 168 hours. Lipids  Recent Labs  Lab 03/20/24 0654  CHOL 98  TRIG 42  HDL 43  LDLCALC 47  CHOLHDL 2.3    Hematology Recent Labs  Lab 03/20/24 0103  WBC 11.4*  RBC 3.88*  HGB 12.0*  HCT 36.9*  MCV 95.1  MCH 30.9  MCHC 32.5  RDW 15.9*  PLT 178   Thyroid   Recent Labs  Lab 03/20/24 0103  TSH 4.633*    BNPNo results for input(s): BNP, PROBNP in the last 168 hours.  DDimer No results for input(s): DDIMER in the last 168 hours.  Radiology/Studies:  DG Chest Portable 1 View Result Date: 03/20/2024 EXAM: 1 VIEW(S) XRAY OF THE CHEST 03/20/2024 01:27:00 AM COMPARISON: None available. CLINICAL HISTORY: afibetc FINDINGS: LUNGS AND PLEURA: No focal pulmonary opacity. No pleural effusion. No pneumothorax. HEART AND MEDIASTINUM: No acute abnormality of the cardiac and mediastinal silhouettes. BONES AND SOFT TISSUES: Cervicothoracic hardware noted. Osseous structures are age appropriate. Mixed lytic and sclerotic expansile lesion within the right sixth rib posteriorly again noted. IMPRESSION: 1. No acute findings. Electronically signed by: Dorethia Molt MD 03/20/2024 01:41 AM EST RP Workstation: [id number]   Assessment and Plan:  Paroxysmal A-Fib with RVR Atrial flutter Known history of PAF Underwent PVI ablation 2018 Home meds: amiodarone  200 mg daily, Eliquis  5 mg BID Presented in A-Fib RVR with rates as high as 120-130s Endorsed cold symptoms x 1 week Positive for RSV  Started on IV diltiazem   Currently in atrial flutter with HR 120s Echo 09/2022: severe LAE TSH high, potassium  low, mag pending  Reports missed dose of PM medications, including Eliquis , Friday 03/06/2024 Pending updated echocardiogram Continue IV diltiazem   Continue Eliquis  5 mg BID Focus management on acute RSV infection  Elevated troponin level  Troponin level 17 ? 105 ? 154 ? 143 Negative Lexiscan  in 2017  CCTA 2018: no CAD, calcium  score 0 LDL 47 Suspect demand ischemia in the setting of A-Fib/flutter with RVR, recent RSV infection Denies any recent exertional symptoms, however he is wheelchair bound at baseline   Chronic HFpEF Hypertension Home meds: hydralazine  100 mg TID, Entresto  49-51 mg BID, spironolactone  25 mg daily, torsemide  20 mg every other day Echo 09/2022: LVEF 60-65%, normal RV function Reports has not been taking torsemide  as directed because family was in town and he did not want to have to urinate as often Taking torsemide  every 4 days  Sleeps with head of bed elevated  Reports mild weight gain  Weight 241 lb at appointment Aug. 2025 when euvolemic  Currently on IV diltiazem  -- holding off home BP meds for now Pending updated echo Consider additional dose of torsemide  20 mg   Per primary RSV infection Leukocytosis  Polyneuropathy  History of GBS Elevated TSH Multiple myeloma in remission OSA on CPAP Electrolyte disturbances   Risk Assessment/Risk Scores:      [place]  Heart Association (NYHA) Functional Class NYHA Class II  CHA2DS2-VASc Score = 3   This indicates a 3.2% annual risk of stroke. The patient's score is based upon: CHF History: 1 HTN History: 1 Diabetes History: 0 Stroke History: 0 Vascular Disease History: 0 Age Score: 1 Gender Score: 0      For questions or updates, please contact [facility] Please consult [email or web address] for contact info under    Signed, Waddell DELENA Donath, PA-C  03/20/2024 12:07 PM

## 2024-03-20 NOTE — ED Provider Notes (Signed)
 [place] EMERGENCY DEPARTMENT AT [facility] Provider Note   CSN: [id number] Arrival date & time: 03/20/24  0025     Patient presents with: Irregular Heart Beat (Pt BIB [facility] for irregular heart rhythm. Patient states that he has been feeling a flutter in his chest for approx 1 hr. Pt with history of Afib s/p ablasion (2018). Patient states that it has been 2 years since he has been in Afib. Patient also endorses head congestion and says he has been fighting a cold for approx 1 week. //)   Travis [name] is a 73 y.o. male.   The history is provided by the patient.  Palpitations Palpitations quality:  Fast Onset quality:  Sudden Timing:  Constant Progression:  Unchanged Chronicity:  New Context: not blood loss and not bronchodilators   Context comment:  Sudafed Relieved by:  Nothing Worsened by:  Nothing Ineffective treatments:  None tried Associated symptoms: no back pain, no chest pain, no chest pressure, no cough, no diaphoresis, no dizziness and no hemoptysis   Risk factors: hx of atrial fibrillation   Risk factors: no diabetes mellitus and no heart disease   Patient with AFIB s/p ablation presents with palotiations post taking sudafed for congestion and URI symptoms x 5 days.      Past Medical History:  Diagnosis Date   A-fib [facility])    Arthritis    comes and goes; mostly in hands, occasionally elbow (04/05/2017)   Complication of anesthesia    just wanted to sleep alot  for couple days S/P VARICOCELE OR (04/05/2017)   DVT (deep venous thrombosis) (HCC)    LLE   Dysrhythmia    PVCs    GERD (gastroesophageal reflux disease)    History of hiatal hernia    History of radiation therapy    Thoracic Spine- 07/13/22-07/23/22-Dr. Lynwood Nasuti   Hypertension    Impaired glucose tolerance    Multiple myeloma (HCC)    Obesity (BMI 30-39.9) 07/26/2016   OSA on CPAP 07/26/2016   Mild with AHI 12.5/hr now on CPAP at 14cm H2O   PAF (paroxysmal atrial  fibrillation) (HCC)    s/p PVI Ablation in 2018 // Flecainide  Rx // Echo 8/21: EF 60-65, no RWMA, mild LVH, normal RV SF, moderate RVE, mild BAE, trivial MR, mild dilation of aortic root (40 mm)   Pneumonia    may have had walking pneumonia a few years ago (04/05/2017)   Pre-diabetes    Thrombophlebitis      Prior to Admission medications   Medication Sig Start Date End Date Taking? Authorizing Provider  acyclovir  (ZOVIRAX ) 400 MG tablet Take 1 tablet (400 mg total) by mouth 2 (two) times daily. 09/19/23   Federico Norleen ONEIDA MADISON, MD  albuterol  (VENTOLIN  HFA) 108 [phone] Base) MCG/ACT inhaler Inhale 2 puffs into the lungs every 6 (six) hours as needed for wheezing or shortness of breath. 10/03/22   Shalhoub, Zachary PARAS, MD  amiodarone  (PACERONE ) 200 MG tablet Take 1 tablet (200 mg total) by mouth daily. 09/19/23   Okey Vina GAILS, MD  calcium  carbonate (TUMS - DOSED IN MG ELEMENTAL CALCIUM ) 500 MG chewable tablet Chew 1 tablet by mouth daily.    [provider]  cholecalciferol  (VITAMIN D3) 25 MCG (1000 UNIT) tablet Take 1,000 Units by mouth daily.    [provider]  Cyanocobalamin  (VITAMIN B-12 PO) Take 1,000 mcg by mouth daily.    [provider]  docusate sodium  (COLACE) 100 MG capsule Take 1 capsule (  100 mg total) by mouth every 12 (twelve) hours. 09/22/23   Ula Prentice SAUNDERS, MD  ELIQUIS  5 MG TABS tablet TAKE ONE TABLET BY MOUTH TWICE A DAY 08/03/23   Okey Vina GAILS, MD  fluticasone  (FLONASE ) 50 MCG/ACT nasal spray Place 1 spray into both nostrils daily as needed for allergies or rhinitis.    [provider]  gabapentin  (NEURONTIN ) 300 MG capsule TAKE TWO CAPSULES BY MOUTH TWICE A DAY 05/10/23   Patel, Donika K, DO  hydrALAZINE  (APRESOLINE ) 100 MG tablet Take 1 tablet (100 mg total) by mouth 3 (three) times daily. 05/09/23   Okey Vina GAILS, MD  HYDROmorphone  (DILAUDID ) 2 MG tablet Take 2 mg by mouth every 4 (four) hours as needed for severe pain (pain score 7-10).    [provider]  ketoconazole  (NIZORAL ) 2 % cream as needed for irritation. 10/23/22   [provider]  levETIRAcetam  (KEPPRA ) 500 MG tablet Take 500 mg by mouth 2 (two) times daily.    [provider]  loratadine  (CLARITIN ) 10 MG tablet Take 10 mg by mouth daily.    [provider]  metolazone  (ZAROXOLYN ) 2.5 MG tablet Take 1 tablet (2.5 mg total) by mouth every other day. 01/21/23   Okey Vina GAILS, MD  polyethylene glycol (MIRALAX ) 17 g packet Take 17 g by mouth daily. 09/22/23   Ula Prentice SAUNDERS, MD  polyethylene glycol powder (GLYCOLAX /MIRALAX ) 17 GM/SCOOP powder Take 1 g by mouth daily.    [provider]  potassium chloride  SA (KLOR-CON  M) 20 MEQ tablet TAKE TWO TABLETS BY MOUTH ONE TIME DAILY . THEN TAKE AN EXTRA TABLET ON THE DAY YOU TAKE THE METOLAZONE  09/15/23   Okey Vina GAILS, MD  sacubitril -valsartan  (ENTRESTO ) 49-51 MG Take 1 tablet by mouth 2 (two) times daily.    [provider]  sodium chloride  (OCEAN) 0.65 % SOLN nasal spray Place 1 spray into both nostrils as needed for congestion.    [provider]  spironolactone  (ALDACTONE ) 25 MG tablet Take 1 tablet (25 mg total) by mouth daily. 01/27/23   Lucien Orren SAILOR, PA-C  sulfamethoxazole -trimethoprim  (BACTRIM  DS) 800-160 MG tablet Take 1 tablet by mouth 3 (three) times a week.    [provider]  torsemide  (DEMADEX ) 20 MG tablet Take 1 tablet (20 mg total) by mouth daily. Patient taking differently: Take 20 mg by mouth every other day. 12/27/23   Lucien Orren SAILOR, PA-C    Allergies: Octagam [immune globulin ], Linezolid , and Vancomycin     Review of Systems  Constitutional:  Negative for diaphoresis.  Respiratory:  Negative for cough and hemoptysis.   Cardiovascular:  Positive for palpitations. Negative for chest pain.  Musculoskeletal:  Negative for back pain.  Neurological:  Negative for dizziness.  All other systems reviewed and are negative.   Updated Vital Signs BP 126/75    Pulse (!) 115   Temp 98 F (36.7 C)   Resp 11   Ht 5' 10 (1.778 m)   Wt 110.7 kg   SpO2 96%   BMI 35.01 kg/m   Physical Exam Vitals and nursing note reviewed.  Constitutional:      General: He is not in acute distress.    Appearance: Normal appearance. He is well-developed. He is not diaphoretic.  HENT:     Head: Normocephalic and atraumatic.     Nose: Congestion present.  Eyes:     Conjunctiva/sclera: Conjunctivae normal.     Pupils: Pupils are equal, round, and reactive to light.  Cardiovascular:     Rate and Rhythm: Tachycardia present. Rhythm irregular.     Pulses: Normal pulses.     Heart sounds: Normal heart sounds.  Pulmonary:     Effort: Pulmonary effort is normal.     Breath sounds: Normal breath sounds. No wheezing or rales.  Abdominal:     General: Bowel sounds are normal.     Palpations: Abdomen is soft.     Tenderness: There is no abdominal tenderness. There is no guarding or rebound.  Musculoskeletal:        General: Normal range of motion.     Cervical back: Normal range of motion and neck supple.  Skin:    General: Skin is warm and dry.     Capillary Refill: Capillary refill takes less than 2 seconds.  Neurological:     General: No focal deficit present.     Mental Status: He is alert and oriented to person, place, and time.     Deep Tendon Reflexes: Reflexes normal.  Psychiatric:        Thought Content: Thought content normal.     (all labs ordered are listed, but only abnormal results are displayed) Results for orders placed or performed during the hospital encounter of 03/20/24  Resp panel by RT-PCR (RSV, Flu A&B, Covid) Anterior Nasal Swab   Collection Time: 03/20/24  1:01 AM   Specimen: Anterior Nasal Swab  Result Value Ref Range   SARS Coronavirus 2 by RT PCR NEGATIVE NEGATIVE   Influenza A by PCR NEGATIVE NEGATIVE   Influenza B by PCR NEGATIVE NEGATIVE   Resp Syncytial Virus by PCR POSITIVE (A) NEGATIVE  CBC with Differential    Collection Time: 03/20/24  1:03 AM  Result Value Ref Range   WBC 11.4 (H) 4.0 - 10.5 K/uL   RBC 3.88 (L) 4.22 - 5.81 MIL/uL   Hemoglobin 12.0 (L) 13.0 - 17.0 g/dL   HCT 63.0 (L) 60.9 - 47.9 %   MCV 95.1 80.0 - 100.0 fL   MCH 30.9 26.0 - 34.0 pg   MCHC 32.5 30.0 - 36.0 g/dL   RDW 84.0 (H) 88.4 - 84.4 %   Platelets 178 150 - 400 K/uL   nRBC 0.0 0.0 - 0.2 %   Neutrophils Relative % 87 %   Neutro Abs 9.9 (H) 1.7 - 7.7 K/uL   Lymphocytes Relative 3 %   Lymphs Abs 0.3 (L) 0.7 - 4.0 K/uL   Monocytes Relative 9 %   Monocytes Absolute 1.0 0.1 - 1.0 K/uL   Eosinophils Relative 0 %   Eosinophils Absolute 0.0 0.0 - 0.5 K/uL   Basophils Relative 1 %   Basophils Absolute 0.1 0.0 - 0.1 K/uL   Immature Granulocytes 0 %   Abs Immature Granulocytes 0.04 0.00 - 0.07 K/uL  Basic metabolic panel   Collection Time: 03/20/24  1:03 AM  Result Value Ref Range   Sodium 136 135 - 145 mmol/L   Potassium 3.3 (L) 3.5 - 5.1 mmol/L   Chloride 101 98 - 111 mmol/L   CO2 21 (L) 22 - 32 mmol/L   Glucose, Bld 95 70 - 99 mg/dL   BUN 12 8 - 23 mg/dL   Creatinine, Ser 9.17 0.61 - 1.24 mg/dL   Calcium  7.8 (L) 8.9 - 10.3 mg/dL   GFR, Estimated >39 >39 mL/min   Anion gap 14 5 - 15  TSH   Collection Time: 03/20/24  1:03 AM  Result Value Ref Range   TSH 4.633 (H)  0.350 - 4.500 uIU/mL  Troponin I (High Sensitivity)   Collection Time: 03/20/24  1:03 AM  Result Value Ref Range   Troponin I (High Sensitivity) 17 <18 ng/L   *Note: Due to a large number of results and/or encounters for the requested time period, some results have not been displayed. A complete set of results can be found in Results Review.   DG Chest Portable 1 View Result Date: 03/20/2024 EXAM: 1 VIEW(S) XRAY OF THE CHEST 03/20/2024 01:27:00 AM COMPARISON: None available. CLINICAL HISTORY: afibetc FINDINGS: LUNGS AND PLEURA: No focal pulmonary opacity. No pleural effusion. No pneumothorax. HEART AND MEDIASTINUM: No acute abnormality of the cardiac  and mediastinal silhouettes. BONES AND SOFT TISSUES: Cervicothoracic hardware noted. Osseous structures are age appropriate. Mixed lytic and sclerotic expansile lesion within the right sixth rib posteriorly again noted. IMPRESSION: 1. No acute findings. Electronically signed by: Dorethia Molt MD 03/20/2024 01:41 AM EST RP Workstation: [id number]    EKG: EKG Interpretation Date/Time:  Tuesday March 20 2024 00:43:17 EST Ventricular Rate:  147 PR Interval:    QRS Duration:  87 QT Interval:  255 QTC Calculation: 433 R Axis:   110  Text Interpretation: Atrial fibrillation with rapid V-rate Repolarization abnormality, prob rate related Confirmed by Melea Prezioso ([id number]) on 03/20/2024 1:39:59 AM  Radiology: [facility] Chest Portable 1 View Result Date: 03/20/2024 EXAM: 1 VIEW(S) XRAY OF THE CHEST 03/20/2024 01:27:00 AM COMPARISON: None available. CLINICAL HISTORY: afibetc FINDINGS: LUNGS AND PLEURA: No focal pulmonary opacity. No pleural effusion. No pneumothorax. HEART AND MEDIASTINUM: No acute abnormality of the cardiac and mediastinal silhouettes. BONES AND SOFT TISSUES: Cervicothoracic hardware noted. Osseous structures are age appropriate. Mixed lytic and sclerotic expansile lesion within the right sixth rib posteriorly again noted. IMPRESSION: 1. No acute findings. Electronically signed by: Dorethia Molt MD 03/20/2024 01:41 AM EST RP Workstation: [id number]     .Critical Care  Performed by: Nettie Earing, MD Authorized by: Nettie Earing, MD   Critical care provider statement:    Critical care time (minutes):  30   Critical care end time:  03/20/2024 3:16 AM   Critical care was necessary to treat or prevent imminent or life-threatening deterioration of the following conditions:  Cardiac failure   Critical care was time spent personally by me on the following activities:  Development of treatment plan with patient or surrogate, discussions with consultants, evaluation of patient's response  to treatment, examination of patient, ordering and review of laboratory studies, ordering and review of radiographic studies, ordering and performing treatments and interventions, pulse oximetry, re-evaluation of patient's condition and review of old charts    Medications Ordered in the ED  diltiazem  (CARDIZEM ) 1 mg/mL load via infusion 10 mg (10 mg Intravenous Bolus from Bag 03/20/24 0111)    And  diltiazem  (CARDIZEM ) 125 mg in dextrose  5% 125 mL (1 mg/mL) infusion (7.5 mg/hr Intravenous Rate/Dose Change 03/20/24 0157)  apixaban  (ELIQUIS ) tablet 5 mg (has no administration in time range)  potassium chloride  SA (KLOR-CON  M) CR tablet 20 mEq (has no administration in time range)  acetaminophen  (TYLENOL ) tablet 500 mg (has no administration in time range)  prochlorperazine  (COMPAZINE ) injection 5 mg (has no administration in time range)  melatonin tablet 5 mg (has no administration in time range)  polyethylene glycol (MIRALAX  / GLYCOLAX ) packet 17 g (has no administration in time range)  Medical Decision Making Patient with AFIB and RVR  Amount and/or Complexity of Data Reviewed Independent Historian: EMS    Details: See above  External Data Reviewed: notes.    Details: Previous notes reviewed  Labs: ordered.    Details: RSV positive, covid negative.  Troponin 17 and normal.  White count elevated 11.4, hemoglobin slight low 12, normal platelets. Normal sodium 136, slight low potassium 3.3  Radiology: ordered and independent interpretation performed.    Details: No CHF by me   Risk Prescription drug management. Decision regarding hospitalization. Risk Details: IV diltiazem  started.       Final diagnoses:  Atrial fibrillation with RVR (HCC)  RSV (acute bronchiolitis due to respiratory syncytial virus)  Hypokalemia   The patient appears reasonably stabilized for admission considering the current resources, flow, and capabilities available in  the ED at this time, and I doubt any other [facility] requiring further screening and/or treatment in the ED prior to admission.  ED Discharge Orders     None          Javi Bollman, MD 03/20/24 [phone]

## 2024-03-20 NOTE — Evaluation (Signed)
 Occupational Therapy Evaluation Patient Details Name: Travis [name] MRN: [id number] DOB: Jul 03, 1950 Today's Date: 03/20/2024   History of Present Illness   Travis [name] is a 73 y.o. male who presented to the ER due to fluttering in his chest after taking Pseudophed. Found to be in Afib-RVR and RSV. 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     Clinical Impressions This 73 yo male admitted with above presents to acute OT with PLOF of needing A from wife and PCA for basic ADLs and transfers (using stand A machine) and A for LB ADLs/toileting. He currently is setup-total A for basic ADLs and Min to Mod A for bed mobility. He will continue to benefit from acute OT with follow up HHOT.     If plan is discharge home, recommend the following:   A lot of help with walking and/or transfers;A lot of help with bathing/dressing/bathroom;Help with stairs or ramp for entrance;Assist for transportation     Functional Status Assessment   Patient has had a recent decline in their functional status and demonstrates the ability to make significant improvements in function in a reasonable and predictable amount of time.     Equipment Recommendations   None recommended by OT      Precautions/Restrictions   Precautions Precautions: Fall Restrictions Weight Bearing Restrictions Per Provider Order: No     Mobility Bed Mobility Overal bed mobility: Needs Assistance Bed Mobility: Supine to Sit, Sit to Supine     Supine to sit: Mod assist (stretcher) Sit to supine: Min assist (stretcher)           Balance Overall balance assessment: Needs assistance Sitting-balance  support: Bilateral upper extremity supported, Feet supported Sitting balance-Leahy Scale: Poor                                     ADL either performed or assessed with clinical judgement   ADL Overall ADL's : Needs assistance/impaired Eating/Feeding: Independent;Bed level   Grooming: Set up Grooming Details (indicate cue type and reason): supported sitting Upper Body Bathing: Set up Upper Body Bathing Details (indicate cue type and reason): supported sitting Lower Body Bathing: Total assistance;Sitting/lateral leans   Upper Body Dressing : Set up Upper Body Dressing Details (indicate cue type and reason): supported sitting Lower Body Dressing: Total assistance;Sitting/lateral leans                       Vision Patient Visual Report: No change from baseline              Pertinent Vitals/Pain Pain Assessment Pain Assessment: No/denies pain     Extremity/Trunk Assessment Upper Extremity Assessment Upper Extremity Assessment: Right hand dominant;Overall [facility] for tasks assessed   Lower Extremity Assessment Lower Extremity Assessment:  (RLE weaker than LLE)       Communication Communication Communication: No apparent difficulties   Cognition Arousal: Alert Behavior During Therapy: WFL for tasks assessed/performed Cognition: No apparent impairments                               Following commands: Intact  Cueing    Cueing Techniques: Verbal cues              Home Living Family/patient expects to be discharged to:: Private residence Living Arrangements: Spouse/significant other Available Help at Discharge: Family;Available 24 hours/day;Personal care attendant (PCA M-F (6.5 hours), weekend (4.5 hours)) Type of Home: House Home Access: Ramped entrance     Home Layout: Two level;Able to live on main level with bedroom/bathroom     Bathroom Shower/Tub:  (no threshold shower)   Bathroom Toilet: Handicapped  height     Home Equipment: Cane - single Librarian, Academic (2 wheels);Shower seat - built in;Hand held shower head;Wheelchair - manual   Additional Comments: lift recliner, lumex stand A (similar to stedy)      Prior Functioning/Environment Prior Level of Function : History of Falls (last six months);Needs assist             Mobility Comments: gets around in manual W/C most of the time; Lumex stand A for transfers ADLs Comments: PCA and wife A him with ADLs    OT Problem List: Impaired balance (sitting and/or standing);Obesity   OT Treatment/Interventions: Self-care/ADL training;DME and/or AE instruction;Balance training;Therapeutic exercise;Therapeutic activities;Patient/family education      OT Goals(Current goals can be found in the care plan section)   Acute Rehab OT Goals Patient Stated Goal: to feel better and go home with continued therapy at home OT Goal Formulation: With patient Time For Goal Achievement: 04/03/24 Potential to Achieve Goals: Good   OT Frequency:  Min 2X/week       AM-PAC OT 6 Clicks Daily Activity     Outcome Measure Help from another person eating meals?: None Help from another person taking care of personal grooming?: A Little Help from another person toileting, which includes using toliet, bedpan, or urinal?: A Lot Help from another person bathing (including washing, rinsing, drying)?: A Lot Help from another person to put on and taking off regular upper body clothing?: A Little Help from another person to put on and taking off regular lower body clothing?: Total 6 Click Score: 15   End of Session    Activity Tolerance: Patient tolerated treatment well Patient left: in bed (getting ready to be transferred to floor)  OT Visit Diagnosis: Other abnormalities of gait and mobility (R26.89);Muscle weakness (generalized) (M62.81)                Time: 8558-8544 OT Time Calculation (min): 14 min Charges:  OT General Charges $OT Visit:  1 Visit OT Evaluation $OT Eval Moderate Complexity: 1 Mod  Cathy L. OT Acute Rehabilitation Services Office [phone]    [place] Dorothyann Distel 03/20/2024, 3:59 PM

## 2024-03-20 NOTE — Progress Notes (Signed)
 Patient currently being evaluated in the Emergency Department for atrial fibrillation reports being told they have RSV. The patient requested collection of blood for a myeloma panel, stating they were advised to contact you regarding this.  Response Vilinda IVAR Kidney IV, MD, 9 minutes ago): "Sure, OK to collect multiple myeloma panel and serum kappa/lambda light chains."

## 2024-03-20 NOTE — ED Notes (Signed)
 Cardiology at [facility]

## 2024-03-20 NOTE — ED Notes (Signed)
 OT at [facility]. Pt taken up to floor. Floor notified.

## 2024-03-20 NOTE — Progress Notes (Signed)
 Echocardiogram 2D Echocardiogram has been performed.  Travis [name] 03/20/2024, 6:52 PM

## 2024-03-20 NOTE — Progress Notes (Signed)
 PROGRESS NOTE    Travis [name]  FMW:[id number] DOB: 07/11/1950 DOA: 03/20/2024 PCP: Charlott Dorn LABOR, MD   Brief Narrative:  HPI: Travis [name] is a 73 y.o. 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  States he tried Afrin today and Flonase  a few days prior for nasal congestion.  Symptoms have been present since Friday, 03/16/2024.  Has had a persistent cough.  Denies subjective fevers or chills.  EMS was activated.  The patient was in A-fib with RVR.  Endorses compliance with his home medications including rate control agents.   In the ER, heart rate in the 140s.  Started on Cardizem  drip with improvement.  RSV by PCR positive.  Admitted by [facility], hospitalist service.   ED Course: Temperature 97.4.  BP 124/79, pulse 110, respiration rate 25, O2 saturation 96% on room air.  Assessment & Plan:   Principal Problem:   Atrial fibrillation with RVR (HCC)  History of paroxysmal A-fib admitted with with RVR, POA: Presented with heart rate in the 140s, started on Cardizem  drip, amiodarone  and Eliquis  resumed.  TSH actually elevated.  Echo pending.  Cardiology consulted.   Elevated troponin, POA: Troponin 17>105> 154.  Could be demand ischemia in the setting of A-fib with RVR.  He was given aspirin  in the ED, Eliquis  resumed.  Echo pending.  Cardiology consulted.  Management per them.   RSV infection, POA Minimal respiratory symptoms.  Continue supportive care.  Patient is not hypoxic.   Mild leukocytosis with neutrophilia No other signs of infection except RSV.   Hypokalemia Serum potassium 3.3,  already replenished, magnesium  and repeat potassium levels are pending.   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   Polyneuropathy Resume home gabapentin  Continue fall precautions.   Chronic HFpEF Euvolemic on exam Monitor strict I's and O's and daily weight   OSA CPAP nightly   Multiple myeloma Outpatient follow-up with medical oncology Resume home regimen and prophylactic medications   Obesity BMI 35 Recommend weight loss outpatient with regular physical activity and healthy dieting.    DVT prophylaxis: Eliquis    Code Status: Full Code  Family Communication:  None present at bedside.  Plan of care discussed with patient in length and he/she verbalized understanding and agreed with it.  Status is: Inpatient Remains inpatient appropriate because: Still with A-fib with RVR on Cardizem  drip.   Estimated body mass index is 35.01 kg/m as calculated from the following:   Height as of this encounter: 5' 10 (1.778 m).   Weight as of this encounter: 110.7 kg.    Nutritional Assessment: Body mass index is 35.01 kg/m.SABRA Seen by dietician.  I agree with the assessment and plan as outlined below: Nutrition Status:        . Skin Assessment: I have examined the patient's skin and I agree with the wound assessment as performed by the wound care RN as outlined below:    Consultants:  Cardiology  Procedures:  None  Antimicrobials:  Anti-infectives (From admission, onward)    Start  Dose/Rate Route Frequency Ordered Stop   03/20/24 1000  acyclovir  (ZOVIRAX ) tablet 400 mg        400 mg Oral 2 times daily 03/20/24 0549           Subjective: Patient seen and examined in the ED.  He complains of generalized weakness, cough and nasal congestion.  Denies any palpitation, shortness of breath or chest pain or other  complaint.  Objective: Vitals:   03/20/24 0615 03/20/24 0645 03/20/24 0700 03/20/24 0756  BP: 124/85 123/78 125/83   Pulse: (!) 101 (!) 109 (!) 104 (!) 107  Resp: (!) 21 14 14    Temp:    98.7 F (37.1 C)  TempSrc:    Oral  SpO2: 95% 97% 100%   Weight:      Height:        Intake/Output Summary (Last 24 hours) at 03/20/2024 0902 Last data filed at 03/20/2024 0800 Gross per 24 hour  Intake 566.85 ml  Output 100 ml  Net 466.85 ml   Filed Weights   03/20/24 0049  Weight: 110.7 kg    Examination:  General exam: Appears calm and comfortable  Respiratory system: Clear to auscultation. Respiratory effort normal. Cardiovascular system: S1 & S2 heard, RRR. No JVD, murmurs, rubs, gallops or clicks.  +1 pitting edema right lower extremity(per patient, this is chronic) Gastrointestinal system: Abdomen is nondistended, soft and nontender. No organomegaly or masses felt. Normal bowel sounds heard. Central nervous system: Alert and oriented. No focal neurological deficits. Extremities: Symmetric 5 x 5 power. Skin: No rashes, lesions or ulcers Psychiatry: Judgement and insight appear normal. Mood & affect appropriate.    Data Reviewed: I have personally reviewed following labs and imaging studies  CBC: Recent Labs  Lab 03/20/24 0103  WBC 11.4*  NEUTROABS 9.9*  HGB 12.0*  HCT 36.9*  MCV 95.1  PLT 178   Basic Metabolic Panel: Recent Labs  Lab 03/20/24 0103  NA 136  K 3.3*  CL 101  CO2 21*  GLUCOSE 95  BUN 12  CREATININE 0.82  CALCIUM  7.8*   GFR: Estimated Creatinine Clearance: 101.5 mL/min (by C-G formula based on SCr of 0.82 mg/dL). Liver Function Tests: No results for input(s): AST, ALT, ALKPHOS, BILITOT, PROT, ALBUMIN  in the last 168 hours. No results for input(s): LIPASE, AMYLASE in the last 168 hours. No results for input(s): AMMONIA in the last 168 hours. Coagulation Profile: No results for input(s): INR, PROTIME in the last 168  hours. Cardiac Enzymes: No results for input(s): CKTOTAL, CKMB, CKMBINDEX, TROPONINI in the last 168 hours. BNP (last 3 results) No results for input(s): PROBNP in the last 8760 hours. HbA1C: No results for input(s): HGBA1C in the last 72 hours. CBG: No results for input(s): GLUCAP in the last 168 hours. Lipid Profile: Recent Labs    03/20/24 0654  CHOL 98  HDL 43  LDLCALC 47  TRIG 42  CHOLHDL 2.3   Thyroid  Function Tests: Recent Labs    03/20/24 0103  TSH 4.633*   Anemia Panel: No results for input(s): VITAMINB12, FOLATE, FERRITIN, TIBC, IRON, RETICCTPCT in the last 72 hours. Sepsis Labs: No results for input(s): PROCALCITON, LATICACIDVEN in the last 168 hours.  Recent Results (from the past 240 hours)  Resp panel by RT-PCR (RSV, Flu A&B, Covid) Anterior Nasal Swab     Status: Abnormal   Collection Time: 03/20/24  1:01 AM   Specimen: Anterior Nasal Swab  Result Value Ref Range Status   SARS Coronavirus 2 by RT  PCR NEGATIVE NEGATIVE Final   Influenza A by PCR NEGATIVE NEGATIVE Final   Influenza B by PCR NEGATIVE NEGATIVE Final    Comment: (NOTE) The Xpert Xpress SARS-CoV-2/FLU/RSV plus assay is intended as an aid in the diagnosis of influenza from Nasopharyngeal swab specimens and should not be used as a sole basis for treatment. Nasal washings and aspirates are unacceptable for Xpert Xpress SARS-CoV-2/FLU/RSV testing.  Fact Sheet for Patients: bloggercourse.com  Fact Sheet for Healthcare Providers: seriousbroker.it  This test is not yet approved or cleared by the [place]  FDA and has been authorized for detection and/or diagnosis of SARS-CoV-2 by FDA under an Emergency Use Authorization (EUA). This EUA will remain in effect (meaning this test can be used) for the duration of the COVID-19 declaration under Section 564(b)(1) of the Act, 21 U.S.C. section 360bbb-3(b)(1), unless  the authorization is terminated or revoked.     Resp Syncytial Virus by PCR POSITIVE (A) NEGATIVE Final    Comment: (NOTE) Fact Sheet for Patients: bloggercourse.com  Fact Sheet for Healthcare Providers: seriousbroker.it  This test is not yet approved or cleared by the [place]  FDA and has been authorized for detection and/or diagnosis of SARS-CoV-2 by FDA under an Emergency Use Authorization (EUA). This EUA will remain in effect (meaning this test can be used) for the duration of the COVID-19 declaration under Section 564(b)(1) of the Act, 21 U.S.C. section 360bbb-3(b)(1), unless the authorization is terminated or revoked.  Performed at [facility], [address] [place], [place] [address]      Radiology Studies: DG Chest Portable 1 View Result Date: 03/20/2024 EXAM: 1 VIEW(S) XRAY OF THE CHEST 03/20/2024 01:27:00 AM COMPARISON: None available. CLINICAL HISTORY: afibetc FINDINGS: LUNGS AND PLEURA: No focal pulmonary opacity. No pleural effusion. No pneumothorax. HEART AND MEDIASTINUM: No acute abnormality of the cardiac and mediastinal silhouettes. BONES AND SOFT TISSUES: Cervicothoracic hardware noted. Osseous structures are age appropriate. Mixed lytic and sclerotic expansile lesion within the right sixth rib posteriorly again noted. IMPRESSION: 1. No acute findings. Electronically signed by: Dorethia Molt MD 03/20/2024 01:41 AM EST RP Workstation: [id number]    Scheduled Meds:  acyclovir   400 mg Oral BID   amiodarone   200 mg Oral Daily   apixaban   5 mg Oral BID   fluticasone   2 spray Each Nare Daily   gabapentin   600 mg Oral BID   potassium chloride   20 mEq Oral BID   Continuous Infusions:  diltiazem  (CARDIZEM ) infusion 15 mg/hr (03/20/24 0758)     LOS: 0 days   Fredia Skeeter, MD [facility]  03/20/2024, 9:02 AM   *Please note that this is a verbal dictation therefore any spelling or grammatical  errors are due to the Dragon Medical One system interpretation.  Please page via Amion and do not message via secure chat for urgent patient care matters. Secure chat can be used for non urgent patient care matters.  How to contact the [facility] Attending or Consulting provider 7A - 7P or covering provider during after hours 7P -7A, for this patient?  Check the care team in [facility] and look for a) attending/consulting [facility] provider listed and b) the [facility] team listed. Page or secure chat 7A-7P. Log into [email or web address] and use [place] universal password to access. If you do not have the password, please contact the hospital operator. Locate the [facility] provider you are looking for under [facility] and page to a number that you can be directly reached. If you still have  difficulty reaching the provider, please page the [facility] (Director on Call) for the Hospitalists listed on amion for assistance.

## 2024-03-21 DIAGNOSIS — I10 Essential (primary) hypertension: Secondary | ICD-10-CM | POA: Diagnosis not present

## 2024-03-21 DIAGNOSIS — I4891 Unspecified atrial fibrillation: Secondary | ICD-10-CM | POA: Diagnosis not present

## 2024-03-21 LAB — CBC WITH DIFFERENTIAL/PLATELET
Abs Immature Granulocytes: 0.02 K/uL (ref 0.00–0.07)
Basophils Absolute: 0 K/uL (ref 0.0–0.1)
Basophils Relative: 1 %
Eosinophils Absolute: 0 K/uL (ref 0.0–0.5)
Eosinophils Relative: 0 %
HCT: 35.4 % — ABNORMAL LOW (ref 39.0–52.0)
Hemoglobin: 11.7 g/dL — ABNORMAL LOW (ref 13.0–17.0)
Immature Granulocytes: 0 %
Lymphocytes Relative: 2 %
Lymphs Abs: 0.2 K/uL — ABNORMAL LOW (ref 0.7–4.0)
MCH: 30.7 pg (ref 26.0–34.0)
MCHC: 33.1 g/dL (ref 30.0–36.0)
MCV: 92.9 fL (ref 80.0–100.0)
Monocytes Absolute: 0.8 K/uL (ref 0.1–1.0)
Monocytes Relative: 10 %
Neutro Abs: 7.4 K/uL (ref 1.7–7.7)
Neutrophils Relative %: 87 %
Platelets: 151 K/uL (ref 150–400)
RBC: 3.81 MIL/uL — ABNORMAL LOW (ref 4.22–5.81)
RDW: 15.9 % — ABNORMAL HIGH (ref 11.5–15.5)
WBC: 8.5 K/uL (ref 4.0–10.5)
nRBC: 0 % (ref 0.0–0.2)

## 2024-03-21 LAB — BASIC METABOLIC PANEL WITH GFR
Anion gap: 13 (ref 5–15)
BUN: 11 mg/dL (ref 8–23)
CO2: 25 mmol/L (ref 22–32)
Calcium: 8.1 mg/dL — ABNORMAL LOW (ref 8.9–10.3)
Chloride: 99 mmol/L (ref 98–111)
Creatinine, Ser: 0.67 mg/dL (ref 0.61–1.24)
GFR, Estimated: >60 mL/min (ref >60)
Glucose, Bld: 107 mg/dL — ABNORMAL HIGH (ref 70–99)
Potassium: 3.2 mmol/L — ABNORMAL LOW (ref 3.5–5.1)
Sodium: 137 mmol/L (ref 135–145)

## 2024-03-21 LAB — BRAIN NATRIURETIC PEPTIDE: B Natriuretic Peptide: 460.7 pg/mL — ABNORMAL HIGH (ref 0.0–100.0)

## 2024-03-21 LAB — MAGNESIUM: Magnesium: 1.9 mg/dL (ref 1.7–2.4)

## 2024-03-21 LAB — T3: T3, Total: 78 ng/dL (ref 71–180)

## 2024-03-21 MED ORDER — MAGNESIUM SULFATE 2 GM/50ML IV SOLN
2.0000 g | Freq: Once | INTRAVENOUS | Status: AC
  Administered 2024-03-21: 2 g via INTRAVENOUS
  Filled 2024-03-21: qty 50

## 2024-03-21 MED ORDER — DILTIAZEM HCL ER COATED BEADS 120 MG PO CP24
120.0000 mg | ORAL_CAPSULE | Freq: Every day | ORAL | Status: DC
  Administered 2024-03-21 – 2024-03-22 (×2): 120 mg via ORAL
  Filled 2024-03-21 (×2): qty 1

## 2024-03-21 MED ORDER — POTASSIUM CHLORIDE CRYS ER 20 MEQ PO TBCR
40.0000 meq | EXTENDED_RELEASE_TABLET | ORAL | Status: AC
Start: 2024-03-21 — End: 2024-03-21
  Administered 2024-03-21 (×2): 40 meq via ORAL
  Filled 2024-03-21 (×2): qty 2

## 2024-03-21 MED ORDER — METOPROLOL SUCCINATE ER 50 MG PO TB24
50.0000 mg | ORAL_TABLET | Freq: Every day | ORAL | Status: DC
  Filled 2024-03-21: qty 1

## 2024-03-21 NOTE — Progress Notes (Addendum)
 PROGRESS NOTE    Travis [name]  FMW:[id number] DOB: 1950-12-15 DOA: 03/20/2024 PCP: Charlott Dorn LABOR, MD   Brief Narrative:  HPI: Travis [name] is a 73 y.o. 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  States he tried Afrin today and Flonase  a few days prior for nasal congestion.  Symptoms have been present since Friday, 03/16/2024.  Has had a persistent cough.  Denies subjective fevers or chills.  EMS was activated.  The patient was in A-fib with RVR.  Endorses compliance with his home medications including rate control agents.   In the ER, heart rate in the 140s.  Started on Cardizem  drip with improvement.  RSV by PCR positive.  Admitted by [facility], hospitalist service.   ED Course: Temperature 97.4.  BP 124/79, pulse 110, respiration rate 25, O2 saturation 96% on room air.  Assessment & Plan:   Principal Problem:   Atrial fibrillation with RVR (HCC)  History of paroxysmal A-fib admitted with with RVR, POA: Presented with heart rate in the 140s, started on Cardizem  drip, amiodarone  and Eliquis  resumed.  TSH and free T4 actually elevated.  Echo pending.  Cardiology consulted.   Elevated troponin, POA: Troponin 17>105> 154.  Could be demand ischemia in the setting of A-fib with RVR.  He was given aspirin  in the ED, Eliquis  resumed.  Echo shows normal ejection fraction, no wall motion abnormality.  Cardiology consulted.  Likely demand ischemia.   RSV infection, POA Still with cough and congestion.  Also febrile with last temperature spike of 101.3 at around 4 AM 03/21/2024.  Continue supportive  care.  Patient is not hypoxic.   Mild leukocytosis with neutrophilia No other signs of infection except RSV.   Hypokalemia Low again, will replenish.   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   Polyneuropathy/generalized weakness Continue home gabapentin , fall precautions.  PT OT consulted.   Chronic HFpEF Euvolemic on exam Monitor strict I's and O's and daily weight   OSA CPAP nightly   Multiple myeloma Outpatient follow-up with medical oncology Resume home regimen and prophylactic medications   Obesity BMI 35 Recommend weight loss outpatient with regular physical activity and healthy dieting.    DVT prophylaxis: Eliquis    Code Status: Full Code  Family Communication:  None present at bedside.  Plan of care discussed with patient in length and he/she verbalized understanding and agreed with it.  Status is: Inpatient Remains inpatient appropriate because: Febrile few hours ago.  Feels very weak and does not feel comfortable going home today.   Estimated body mass index is 34.86 kg/m as calculated from the following:   Height as of this encounter: 5' 10 (1.778 m).   Weight as of this encounter: 110.2 kg.    Nutritional Assessment: Body mass index is 34.86 kg/m.SABRA Seen by dietician.  I agree with the assessment and plan as outlined below: Nutrition Status:        . Skin Assessment: I have examined the patient's skin and I agree with the wound assessment as performed by the  wound care RN as outlined below:    Consultants:  Cardiology  Procedures:  None  Antimicrobials:  Anti-infectives (From admission, onward)    Start     Dose/Rate Route Frequency Ordered Stop   03/20/24 1000  acyclovir  (ZOVIRAX ) tablet 400 mg        400 mg Oral 2 times daily 03/20/24 0549           Subjective: Patient seen and examined.  Complains of  cough and congestion and generalized weakness as well as low back pain.  He does not feel confident going home today.  Will be seen by PT OT.  Objective: Vitals:   03/21/24 0325 03/21/24 0500 03/21/24 0802 03/21/24 0833  BP: 129/77  127/74   Pulse: 69  65 69  Resp: 20  18   Temp: (!) 101.3 F (38.5 C)  99.2 F (37.3 C)   TempSrc: Axillary  Oral   SpO2: 95%  91% 92%  Weight:  110.2 kg    Height:        Intake/Output Summary (Last 24 hours) at 03/21/2024 1122 Last data filed at 03/21/2024 0831 Gross per 24 hour  Intake 420 ml  Output 1150 ml  Net -730 ml   Filed Weights   03/20/24 0049 03/20/24 1510 03/21/24 0500  Weight: 110.7 kg 109.9 kg 110.2 kg    Examination:  General exam: Appears calm and comfortable  Respiratory system: Mild rhonchi bilaterally. Respiratory effort normal. Cardiovascular system: S1 & S2 heard, RRR. No JVD, murmurs, rubs, gallops or clicks. No pedal edema. Gastrointestinal system: Abdomen is nondistended, soft and nontender. No organomegaly or masses felt. Normal bowel sounds heard. Central nervous system: Alert and oriented. No focal neurological deficits. Skin: No rashes, lesions or ulcers.  Psychiatry: Judgement and insight appear normal. Mood & affect appropriate.    Data Reviewed: I have personally reviewed following labs and imaging studies  CBC: Recent Labs  Lab 03/20/24 0103 03/21/24 0307  WBC 11.4* 8.5  NEUTROABS 9.9* 7.4  HGB 12.0* 11.7*  HCT 36.9* 35.4*  MCV 95.1 92.9  PLT 178 151   Basic Metabolic Panel: Recent Labs  Lab 03/20/24 0103 03/21/24 0307  NA 136 137  K 3.3* 3.2*  CL 101 99  CO2 21* 25  GLUCOSE 95 107*  BUN 12 11  CREATININE 0.82 0.67  CALCIUM  7.8* 8.1*  MG  --  1.9   GFR: Estimated Creatinine Clearance: 103.8 mL/min (by C-G formula based on SCr of 0.67 mg/dL). Liver Function Tests: No results for input(s): AST, ALT, ALKPHOS, BILITOT, PROT, ALBUMIN  in the last 168 hours. No results for  input(s): LIPASE, AMYLASE in the last 168 hours. No results for input(s): AMMONIA in the last 168 hours. Coagulation Profile: No results for input(s): INR, PROTIME in the last 168 hours. Cardiac Enzymes: No results for input(s): CKTOTAL, CKMB, CKMBINDEX, TROPONINI in the last 168 hours. BNP (last 3 results) No results for input(s): PROBNP in the last 8760 hours. HbA1C: No results for input(s): HGBA1C in the last 72 hours. CBG: No results for input(s): GLUCAP in the last 168 hours. Lipid Profile: Recent Labs    03/20/24 0654  CHOL 98  HDL 43  LDLCALC 47  TRIG 42  CHOLHDL 2.3   Thyroid  Function Tests: Recent Labs    03/20/24 0103 03/20/24 1130  TSH 4.633*  --   FREET4  --  1.17*   Anemia Panel: No results for input(s): VITAMINB12, FOLATE, FERRITIN, TIBC, IRON, RETICCTPCT in the last 72 hours.  Sepsis Labs: No results for input(s): PROCALCITON, LATICACIDVEN in the last 168 hours.  Recent Results (from the past 240 hours)  Resp panel by RT-PCR (RSV, Flu A&B, Covid) Anterior Nasal Swab     Status: Abnormal   Collection Time: 03/20/24  1:01 AM   Specimen: Anterior Nasal Swab  Result Value Ref Range Status   SARS Coronavirus 2 by RT PCR NEGATIVE NEGATIVE Final   Influenza A by PCR NEGATIVE NEGATIVE Final   Influenza B by PCR NEGATIVE NEGATIVE Final    Comment: (NOTE) The Xpert Xpress SARS-CoV-2/FLU/RSV plus assay is intended as an aid in the diagnosis of influenza from Nasopharyngeal swab specimens and should not be used as a sole basis for treatment. Nasal washings and aspirates are unacceptable for Xpert Xpress SARS-CoV-2/FLU/RSV testing.  Fact Sheet for Patients: bloggercourse.com  Fact Sheet for Healthcare Providers: seriousbroker.it  This test is not yet approved or cleared by the [place]  FDA and has been authorized for detection and/or diagnosis of SARS-CoV-2 by FDA  under an Emergency Use Authorization (EUA). This EUA will remain in effect (meaning this test can be used) for the duration of the COVID-19 declaration under Section 564(b)(1) of the Act, 21 U.S.C. section 360bbb-3(b)(1), unless the authorization is terminated or revoked.     Resp Syncytial Virus by PCR POSITIVE (A) NEGATIVE Final    Comment: (NOTE) Fact Sheet for Patients: bloggercourse.com  Fact Sheet for Healthcare Providers: seriousbroker.it  This test is not yet approved or cleared by the [place]  FDA and has been authorized for detection and/or diagnosis of SARS-CoV-2 by FDA under an Emergency Use Authorization (EUA). This EUA will remain in effect (meaning this test can be used) for the duration of the COVID-19 declaration under Section 564(b)(1) of the Act, 21 U.S.C. section 360bbb-3(b)(1), unless the authorization is terminated or revoked.  Performed at [facility], [address], [place], [place] [address]      Radiology Studies: ECHOCARDIOGRAM COMPLETE Result Date: 03/20/2024    ECHOCARDIOGRAM REPORT   Patient Name:   Travis [name] Date of Exam: 03/20/2024 Medical Rec #:  [id number]        Height:       70.0 in Accession #:    [id number]       Weight:       242.3 lb Date of Birth:  03-06-1951       BSA:          2.265 m Patient Age:    72 years         BP:           134/86 mmHg Patient Gender: M                HR:           117 bpm. Exam Location:  Inpatient Procedure: 2D Echo, Cardiac Doppler and Color Doppler (Both Spectral and Color            Flow Doppler were utilized during procedure). Indications:    Atrial Fibrillation I48.91  History:        Patient has prior history of Echocardiogram examinations, most                 recent 09/08/2022. Arrythmias:Atrial Fibrillation,                 Signs/Symptoms:Chest Pain; Risk Factors:Hypertension and Sleep                 Apnea.  Sonographer:    Thea Norlander  RCS Referring Phys: CAROLE N HALL IMPRESSIONS  1. Left ventricular ejection fraction, by estimation, is 60 to 65%. The left ventricle has normal function. The left ventricle has no regional wall motion abnormalities. There is mild left ventricular hypertrophy. Left ventricular diastolic parameters are indeterminate.  2. Right ventricular systolic function is normal. The right ventricular size is normal.  3. The mitral valve is normal in structure. Trivial mitral valve regurgitation.  4. The aortic valve is tricuspid. Aortic valve regurgitation is not visualized. FINDINGS  Left Ventricle: Left ventricular ejection fraction, by estimation, is 60 to 65%. The left ventricle has normal function. The left ventricle has no regional wall motion abnormalities. The left ventricular internal cavity size was normal in size. There is  mild left ventricular hypertrophy. Left ventricular diastolic parameters are indeterminate. Right Ventricle: The right ventricular size is normal. Right vetricular wall thickness was not assessed. Right ventricular systolic function is normal. Left Atrium: Left atrial size was normal in size. Right Atrium: Right atrial size was normal in size. Pericardium: There is no evidence of pericardial effusion. Mitral Valve: The mitral valve is normal in structure. Trivial mitral valve regurgitation. Tricuspid Valve: The tricuspid valve is normal in structure. Tricuspid valve regurgitation is mild. Aortic Valve: The aortic valve is tricuspid. Aortic valve regurgitation is not visualized. Aortic valve peak gradient measures 5.7 mmHg. Pulmonic Valve: The pulmonic valve was not well visualized. Aorta: The aortic root and ascending aorta are structurally normal, with no evidence of dilitation. IAS/Shunts: No atrial level shunt detected by color flow Doppler.  LEFT VENTRICLE PLAX 2D LVIDd:         4.80 cm   Diastology LVIDs:         3.00 cm   LV e' medial:    14.60 cm/s LV PW:         1.30 cm   LV E/e' medial:   6.2 LV IVS:        1.20 cm   LV e' lateral:   19.00 cm/s LVOT diam:     2.50 cm   LV E/e' lateral: 4.8 LV SV:         69 LV SV Index:   30 LVOT Area:     4.91 cm LV IVRT:       39 msec  RIGHT VENTRICLE             IVC RV S prime:     18.30 cm/s  IVC diam: 1.50 cm TAPSE (M-mode): 2.2 cm LEFT ATRIUM             Index        RIGHT ATRIUM           Index LA diam:        3.90 cm 1.72 cm/m   RA Area:     16.30 cm LA Vol (A2C):   39.0 ml 17.22 ml/m  RA Volume:   37.90 ml  16.74 ml/m LA Vol (A4C):   25.5 ml 11.26 ml/m LA Biplane Vol: 31.3 ml 13.82 ml/m  AORTIC VALVE AV Area (Vmax): 3.98 cm AV Vmax:        119.50 cm/s AV Peak Grad:   5.7 mmHg LVOT Vmax:      96.90 cm/s LVOT Vmean:     62.400 cm/s LVOT VTI:       0.140 m  AORTA Ao Root diam: 3.90 cm Ao Asc diam:  3.80 cm MITRAL VALVE MV Area (PHT):  4.86 cm    SHUNTS MV Decel Time: 156 msec    Systemic VTI:  0.14 m MV E velocity: 91.20 cm/s  Systemic Diam: 2.50 cm MV A velocity: 41.90 cm/s MV E/A ratio:  2.18 Vina Gull MD Electronically signed by Vina Gull MD Signature Date/Time: 03/20/2024/7:31:15 PM    Final    DG Chest Portable 1 View Result Date: 03/20/2024 EXAM: 1 VIEW(S) XRAY OF THE CHEST 03/20/2024 01:27:00 AM COMPARISON: None available. CLINICAL HISTORY: afibetc FINDINGS: LUNGS AND PLEURA: No focal pulmonary opacity. No pleural effusion. No pneumothorax. HEART AND MEDIASTINUM: No acute abnormality of the cardiac and mediastinal silhouettes. BONES AND SOFT TISSUES: Cervicothoracic hardware noted. Osseous structures are age appropriate. Mixed lytic and sclerotic expansile lesion within the right sixth rib posteriorly again noted. IMPRESSION: 1. No acute findings. Electronically signed by: Dorethia Molt MD 03/20/2024 01:41 AM EST RP Workstation: [id number]    Scheduled Meds:  acyclovir   400 mg Oral BID   amiodarone   200 mg Oral Daily   apixaban   5 mg Oral BID   diltiazem   120 mg Oral Daily   fluticasone   2 spray Each Nare Daily   gabapentin   600 mg  Oral BID   hydrALAZINE   50 mg Oral TID   [START ON 03/22/2024] metoprolol  succinate  50 mg Oral Daily   potassium chloride   40 mEq Oral Q4H   sacubitril -valsartan   1 tablet Oral BID   spironolactone   25 mg Oral Daily   torsemide   20 mg Oral Daily   Continuous Infusions:  magnesium  sulfate bolus IVPB       LOS: 1 day   Fredia Skeeter, MD [facility]  03/21/2024, 11:22 AM   *Please note that this is a verbal dictation therefore any spelling or grammatical errors are due to the Dragon Medical One system interpretation.  Please page via Amion and do not message via secure chat for urgent patient care matters. Secure chat can be used for non urgent patient care matters.  How to contact the [facility] Attending or Consulting provider 7A - 7P or covering provider during after hours 7P -7A, for this patient?  Check the care team in [facility] and look for a) attending/consulting [facility] provider listed and b) the [facility] team listed. Page or secure chat 7A-7P. Log into [email or web address] and use [place]'s universal password to access. If you do not have the password, please contact the hospital operator. Locate the [facility] provider you are looking for under [facility] and page to a number that you can be directly reached. If you still have difficulty reaching the provider, please page the [facility] (Director on Call) for the Hospitalists listed on amion for assistance.

## 2024-03-21 NOTE — Progress Notes (Signed)
  Progress Note  Patient Name: Travis [name] Date of Encounter: 03/21/2024 [facility] Cardiologist: Vina Gull, MD   Interval Summary   Patient continues to feel poorly this AM with cough, weakness. No shortness of breath.   Vital Signs Vitals:   03/21/24 0325 03/21/24 0500 03/21/24 0802 03/21/24 0833  BP: 129/77  127/74   Pulse: 69  65 69  Resp: 20  18   Temp: (!) 101.3 F (38.5 C)  99.2 F (37.3 C)   TempSrc: Axillary  Oral   SpO2: 95%  91% 92%  Weight:  110.2 kg    Height:        Intake/Output Summary (Last 24 hours) at 03/21/2024 0911 Last data filed at 03/20/2024 2010 Gross per 24 hour  Intake 100 ml  Output 1150 ml  Net -1050 ml      03/21/2024    5:00 AM 03/20/2024    3:10 PM 03/20/2024   12:49 AM  Last 3 Weights  Weight (lbs) 242 lb 15.2 oz 242 lb 4.6 oz 244 lb  Weight (kg) 110.2 kg 109.9 kg 110.678 kg      Telemetry/ECG  NSR with HR in the 70s - Personally Reviewed  Physical Exam  GEN: No acute distress.  Sitting upright in the bed  Neck: No JVD Cardiac:  RRR, no murmurs, rubs, or gallops.  Respiratory: End expiratory wheezing, normal WOB on room air  GI: Soft, nontender, non-distended  MS: No edema in BLE  Assessment & Plan   Paroxysmal Atrial Fibrillation with RVR  Atrial Flutter  - Patient has known history of atrial flutter and PAF. Underwent PVI ablation in 2018. Has been on amiodarone  200 mg daily and eliquis  5 mg BID at home  - Presented with afib with RVR with HR into the 120s-130s in the setting of RSV infection  - Started on IV diltiazem , converted to atrial flutter with HR in the 120s. Then converted to NSR  - Maintaining NSR per tele  - Echo this admission showed EF 60-65%, no regional wall motion abnormalities normal RV systolic function  - Continue amiodarone  200 mg daily  - Continue eliquis  5 mg BID  - Stop PO dilt and increase metoprolol  succinate to 50 mg daily   Elevated Troponin  - hsTn 17>105>154>143  -  Echo with normal EF, no wall motion abnormalities  - Suspect demand ischemia. No further workup needed at this time   Chronic HFpEF  HTN  - Echo this admission showed EF 60-65%  - Euvolemic on exam today  - Continue torsemide  20 mg daily  - Increase metoprolol  succinate to 50 mg daily. Stop diltiazem  to simplify med regiment  - Continue, hydralazine  50 mg TID, entresto  49-51 mg BID, spironolactone  25 mg daily   Per primary RSV infection Leukocytosis  Polyneuropathy  History of GBS Elevated TSH Multiple myeloma in remission OSA on CPAP Electrolyte disturbances   For questions or updates, please contact [facility] Please consult [email or web address] for contact info under       Signed, Rollo FABIENE Louder, PA-C

## 2024-03-21 NOTE — Evaluation (Signed)
 Physical Therapy Evaluation Patient Details Name: Travis [name] MRN: [id number] DOB: 1950/06/15 Today's Date: 03/21/2024  History of Present Illness  Travis [name] is a 73 y.o. male who presented to the ER due to fluttering in his chest after taking Pseudophed. Found to be in Afib-RVR and RSV. 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  Clinical Impression  Pt admitted with above diagnosis and presents to PT with functional limitations due to deficits listed below (See PT problem list). Pt needs skilled PT to maximize independence and safety. Pt from home with wife and paid caregivers. Pt has been using Assisted standing device at home for most transfers. Does use walker to get into recliner from w/c. Currently requiring more assist due to recent illness and decr activity. Expect he will make steady progress. Has been receiving HHPT and can continue these when he returns home.           If plan is discharge home, recommend the following: A lot of help with walking and/or transfers;A lot of help with bathing/dressing/bathroom;Assistance with cooking/housework;Assist for transportation;Help with stairs or ramp for entrance   Can travel by private vehicle        Equipment Recommendations None recommended by PT  Recommendations for Other Services       Functional Status Assessment Patient has had a recent decline in their functional status and demonstrates the ability to make significant improvements in function in a reasonable and predictable amount of time.     Precautions / Restrictions Precautions Precautions: Fall;Other (comment) Recall of Precautions/Restrictions:  Intact Precaution/Restrictions Comments: Droplet for RSV Restrictions Weight Bearing Restrictions Per Provider Order: No      Mobility  Bed Mobility Overal bed mobility: Needs Assistance Bed Mobility: Supine to Sit, Sit to Supine     Supine to sit: Mod assist, HOB elevated Sit to supine: Mod assist   General bed mobility comments: Assist to bring legs off, elevate trunk into sitting and bring hips to EOB. Assist to bring legs back up into bed.    Transfers Overall transfer level: Needs assistance Equipment used: Ambulation equipment used Transfers: Sit to/from Stand, Bed to chair/wheelchair/BSC Sit to Stand: +2 safety/equipment, Mod assist, From elevated surface           General transfer comment: Assist to power up stand and using Stedy. Bed to recliner to [facility] to recliner to bed using Stedy. Resulting in 8 sit to stand transfers. Fatigued with pt less upright as fatigued. Transfer via Lift Equipment: Stedy  Ambulation/Gait                  Stairs            Wheelchair Mobility     Tilt Bed    Modified Rankin (Stroke Patients Only)       Balance Overall balance assessment: Needs assistance Sitting-balance support: Bilateral upper extremity supported, Feet supported Sitting balance-Leahy Scale: Poor Sitting balance - Comments: UE support and CGA   Standing balance support: Bilateral upper extremity supported Standing balance-Leahy Scale: Poor Standing balance comment: Stedy and min to +2 mod assist  Pertinent Vitals/Pain Pain Assessment Pain Assessment: No/denies pain    Home Living Family/patient expects to be discharged to:: Private residence Living Arrangements: Spouse/significant other Available Help at Discharge: Family;Available 24 hours/day;Personal care attendant (PCA M-F 6.5 hrs Sat-Sun 4.5 hrs) Type of Home: House Home Access: Ramped entrance       Home Layout: Two level;Able to live on  main level with bedroom/bathroom Home Equipment: Cane - single point;Rolling Walker (2 wheels);Shower seat - built in;Hand held shower head;Wheelchair - manual Additional Comments: lift recliner, lumex stand A (similar to stedy)    Prior Function Prior Level of Function : History of Falls (last six months);Needs assist       Physical Assist : Mobility (physical) Mobility (physical): Bed mobility;Transfers   Mobility Comments: gets around in manual W/C most of the time; Lumex stand A for transfers ADLs Comments: PCA and wife A him with ADLs     Extremity/Trunk Assessment   Upper Extremity Assessment Upper Extremity Assessment: Defer to OT evaluation    Lower Extremity Assessment Lower Extremity Assessment: RLE deficits/detail;LLE deficits/detail RLE Deficits / Details: grossly 3-/5 LLE Deficits / Details: Grossly 3- to 3/5       Communication   Communication Communication: No apparent difficulties    Cognition Arousal: Alert Behavior During Therapy: WFL for tasks assessed/performed   PT - Cognitive impairments: No apparent impairments                         Following commands: Intact       Cueing Cueing Techniques: Verbal cues     General Comments      Exercises     Assessment/Plan    PT Assessment Patient needs continued PT services  PT Problem List Decreased strength;Decreased activity tolerance;Decreased mobility;Decreased balance       PT Treatment Interventions DME instruction;Functional mobility training;Balance training;Patient/family education;Therapeutic activities;Therapeutic exercise;Neuromuscular re-education    PT Goals (Current goals can be found in the Care Plan section)  Acute Rehab PT Goals Patient Stated Goal: Get stronger PT Goal Formulation: With patient Time For Goal Achievement: 04/04/24 Potential to Achieve Goals: Fair    Frequency Min 2X/week     Co-evaluation               AM-PAC PT 6 Clicks Mobility   Outcome Measure Help needed turning from your back to your side while in a flat bed without using bedrails?: A Lot Help needed moving from lying on your back to sitting on the side of a flat bed without using bedrails?: A Lot Help needed moving to and from a bed to a chair (including a wheelchair)?: Total Help needed standing up from a chair using your arms (e.g., wheelchair or bedside chair)?: Total Help needed to walk in hospital room?: Total Help needed climbing 3-5 steps with a railing? : Total 6 Click Score: 8    End of Session Equipment Utilized During Treatment: Gait belt Activity Tolerance: Patient limited by fatigue Patient left: in bed;with call bell/phone within reach;with bed alarm set Nurse Communication: Mobility status PT Visit Diagnosis: Other abnormalities of gait and mobility (R26.89);Muscle weakness (generalized) (M62.81);Difficulty in walking, not elsewhere classified (R26.2);Other symptoms and signs involving the nervous system (R29.898)    Time: 8994-8951 PT Time Calculation (min) (ACUTE ONLY): 43 min   Charges:   PT Evaluation $PT Eval Moderate Complexity: 1 Mod PT Treatments $Therapeutic Activity: 23-37 mins PT General Charges $$ ACUTE PT VISIT: 1 Visit  [facility] PT Acute Rehabilitation Services Office [phone]   Rodgers ORN [facility] 03/21/2024, 3:35 PM

## 2024-03-22 ENCOUNTER — Other Ambulatory Visit (HOSPITAL_COMMUNITY): Payer: Self-pay

## 2024-03-22 ENCOUNTER — Telehealth: Payer: Self-pay

## 2024-03-22 LAB — BASIC METABOLIC PANEL WITH GFR
Anion gap: 15 (ref 5–15)
BUN: 14 mg/dL (ref 8–23)
CO2: 25 mmol/L (ref 22–32)
Calcium: 8.4 mg/dL — ABNORMAL LOW (ref 8.9–10.3)
Chloride: 97 mmol/L — ABNORMAL LOW (ref 98–111)
Creatinine, Ser: 0.74 mg/dL (ref 0.61–1.24)
GFR, Estimated: >60 mL/min (ref >60)
Glucose, Bld: 106 mg/dL — ABNORMAL HIGH (ref 70–99)
Potassium: 3.3 mmol/L — ABNORMAL LOW (ref 3.5–5.1)
Sodium: 137 mmol/L (ref 135–145)

## 2024-03-22 LAB — CBC WITH DIFFERENTIAL/PLATELET
Abs Immature Granulocytes: 0.02 K/uL (ref 0.00–0.07)
Basophils Absolute: 0.1 K/uL (ref 0.0–0.1)
Basophils Relative: 1 %
Eosinophils Absolute: 0 K/uL (ref 0.0–0.5)
Eosinophils Relative: 0 %
HCT: 37.5 % — ABNORMAL LOW (ref 39.0–52.0)
Hemoglobin: 12.2 g/dL — ABNORMAL LOW (ref 13.0–17.0)
Immature Granulocytes: 0 %
Lymphocytes Relative: 5 %
Lymphs Abs: 0.3 K/uL — ABNORMAL LOW (ref 0.7–4.0)
MCH: 30.3 pg (ref 26.0–34.0)
MCHC: 32.5 g/dL (ref 30.0–36.0)
MCV: 93.3 fL (ref 80.0–100.0)
Monocytes Absolute: 0.9 K/uL (ref 0.1–1.0)
Monocytes Relative: 12 %
Neutro Abs: 5.9 K/uL (ref 1.7–7.7)
Neutrophils Relative %: 82 %
Platelets: 146 K/uL — ABNORMAL LOW (ref 150–400)
RBC: 4.02 MIL/uL — ABNORMAL LOW (ref 4.22–5.81)
RDW: 15.7 % — ABNORMAL HIGH (ref 11.5–15.5)
WBC: 7.3 K/uL (ref 4.0–10.5)
nRBC: 0 % (ref 0.0–0.2)

## 2024-03-22 LAB — MAGNESIUM: Magnesium: 2.2 mg/dL (ref 1.7–2.4)

## 2024-03-22 MED ORDER — METOPROLOL SUCCINATE ER 25 MG PO TB24
25.0000 mg | ORAL_TABLET | Freq: Every day | ORAL | Status: DC
  Administered 2024-03-22: 25 mg via ORAL
  Filled 2024-03-22: qty 1

## 2024-03-22 MED ORDER — HYDRALAZINE HCL 50 MG PO TABS
50.0000 mg | ORAL_TABLET | Freq: Three times a day (TID) | ORAL | 0 refills | Status: AC
  Filled 2024-03-22: qty 90, 30d supply, fill #0

## 2024-03-22 MED ORDER — DILTIAZEM HCL ER COATED BEADS 120 MG PO CP24
120.0000 mg | ORAL_CAPSULE | Freq: Every day | ORAL | 0 refills | Status: AC
  Filled 2024-03-22: qty 30, 30d supply, fill #0

## 2024-03-22 MED ORDER — POTASSIUM CHLORIDE CRYS ER 20 MEQ PO TBCR
40.0000 meq | EXTENDED_RELEASE_TABLET | ORAL | Status: AC
  Administered 2024-03-22 (×2): 40 meq via ORAL
  Filled 2024-03-22 (×2): qty 2

## 2024-03-22 MED ORDER — METOPROLOL SUCCINATE ER 25 MG PO TB24
25.0000 mg | ORAL_TABLET | Freq: Every day | ORAL | 0 refills | Status: DC
  Filled 2024-03-22: qty 30, 30d supply, fill #0

## 2024-03-22 MED ORDER — SPIRONOLACTONE 25 MG PO TABS
25.0000 mg | ORAL_TABLET | Freq: Every day | ORAL | 0 refills | Status: AC
  Filled 2024-03-22: qty 30, 30d supply, fill #0

## 2024-03-22 NOTE — Progress Notes (Signed)
 Mobility Specialist Progress Note:    03/22/24 1440  Mobility  Activity Pivoted/transferred to/from [facility]  Level of Assistance Contact guard assist, steadying assist  Assistive Device Stedy  Distance Ambulated (ft) 3 ft  Range of Motion/Exercises Active  Activity Response Tolerated fair  Mobility Referral Yes  Mobility visit 1 Mobility  Mobility Specialist Start Time (ACUTE ONLY) 1440  Mobility Specialist Stop Time (ACUTE ONLY) 1502  Mobility Specialist Time Calculation (min) (ACUTE ONLY) 22 min   Received pt requesting assistance transferring to [facility] and back to bed. No c/o any symptoms. Pt able to stand and sit w/ CGA. Returned pt to bed w/ all needs met.   Venetia Keel Mobility Specialist Please Neurosurgeon or Rehab Office at [phone]

## 2024-03-22 NOTE — Telephone Encounter (Signed)
 Received call from pt. He states he has not had his multiple myeloma labs done yet though they were requested on 03/20/24 when he was in the ED. Advised that I would have Dr. Federico put those orders in for an in patient draw as pt is currently in the hospital for Afib and RSV. He states he is likely going home today. Advised that he does not have any f/u appts scheduled yet and that we will get those appts in  for next week. Advised that we can see him on 03/27/24 -labs at 12:30 and clinic visit @ 1pm. He will need his IVIG as well. Advised that we will be working on getting that scheduled as well but that it will not be that same day. He voiced understanding.

## 2024-03-22 NOTE — Discharge Summary (Signed)
 Physician Discharge Summary  Travis [name]:[id number] DOB: 12-04-1950 DOA: 03/20/2024  PCP: Charlott Dorn LABOR, MD  Admit date: 03/20/2024 Discharge date: 03/22/2024 30 Day Unplanned Readmission Risk Score    Flowsheet Row ED to Hosp-Admission (Current) from 03/20/2024 in [facility] 3E HF PCU  30 Day Unplanned Readmission Risk Score (%) 26.36 Filed at 03/22/2024 0801    This score is the patient's risk of an unplanned readmission within 30 days of being discharged (0 -100%). The score is based on dignosis, age, lab data, medications, orders, and past utilization.   Low:  0-14.9   Medium: 15-21.9   High: 22-29.9   Extreme: 30 and above          Admitted From: Home Disposition: Home   Recommendations for Outpatient Follow-up:  Follow up with PCP in 1-2 weeks Please obtain BMP/CBC in one week Please follow up with your PCP on the following pending results: Unresulted Labs (From admission, onward)    None         Home Health: Yes Equipment/Devices: None  Discharge Condition: Stable CODE STATUS: Full code Diet recommendation:  Diet Order             Diet Heart Room service appropriate? Yes; Fluid consistency: Thin  Diet effective now                   Subjective: Patient seen and examined, he says that he is feeling much better.  His condition is improving.  Weakness is improving.  No other complaint.  He is willing to go home today.  However he tells me that his wife will be out of town in [place] and will return home at 4 PM.  He is requesting a ride to be scheduled between 4 and 5 PM.  I have discussed this with case production designer, theatre/television/film.  Also, he is requesting to draw multiple myeloma labs that he claims that his oncologist has ordered.  I could not see the labs.  I advised him to call his oncologist and to order if his oncologist agrees to be drawn while he is here.  Brief/Interim Summary: Travis [name] is a 73 y.o. 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 he tried Afrin and Flonase  a few days prior for nasal congestion.  Symptoms have been present since Friday, 03/16/2024.  Has had a persistent cough.  Denied fevers or chills.  EMS was activated.  The patient was in A-fib with RVR.  Endorses compliance with his home medications including rate control agents.   In the ER, heart rate in the 140s.  Started on Cardizem  drip with improvement.  RSV by PCR positive.  Admitted by [facility], hospitalist service.  Details below.   History of paroxysmal A-fib admitted with with RVR, POA: Presented with heart rate in the 140s, started on Cardizem  drip, amiodarone  and Eliquis  resumed.  TSH and free T4 actually elevated.  Echo shows normal ejection fraction and no diastolic dysfunction.  Patient was seen by cardiology, he converted to sinus rhythm, transition to oral Cardizem  and started on Toprol -XL.  Toprol  XL was increased to 50 mg yesterday, he was slightly  bradycardic today but no symptoms.  Discussed with cardiologist Dr. Margaretann, he recommended continuing current dose of Cardizem  as below but reduce Toprol -XL to 25 mg.  Patient is stable and is being discharged home today.   Elevated troponin, POA: Troponin 17>105> 154.  Could be demand ischemia in the setting of A-fib with RVR.  He was given aspirin  in the ED, Eliquis  resumed.  Echo shows normal ejection fraction, no wall motion abnormality.  Cardiology consulted.  Likely demand ischemia.   RSV infection, POA Improving.  I have not seen him coughing.  His major complaint is congestion which is improving.  He did  spike a fever while here.  Last temperature spike low-grade 100.6 around 7 PM last night.  Likely secondary to RSV infection.  No leukocytosis.   Hypokalemia Low again, replenished prior to discharge.   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   Polyneuropathy/generalized weakness Continue home gabapentin , fall precautions.  PT OT consulted.   Chronic HFpEF Euvolemic on exam Monitor strict I's and O's and daily weight   OSA CPAP nightly   Multiple myeloma Outpatient follow-up with medical oncology Resume home regimen and prophylactic medications   Obesity BMI 35 Recommend weight loss outpatient with regular physical activity and healthy dieting.  Discharge Diagnoses:  Principal Problem:   Atrial fibrillation with RVR [facility])    Discharge Instructions   Allergies as of 03/22/2024       Reactions   Octagam [immune Globulin ] Shortness Of Breath, Other (See Comments)   Patient developed hip/back pain, SOB, rigors- see notes 05/13/23   Linezolid  Other (See Comments)   Burning tongue    Vancomycin  Rash   Other Reaction(s): rash, itching, fever, chills        Medication List     STOP taking these medications    cephALEXin 500 MG capsule Commonly known as: KEFLEX   metolazone  2.5 MG tablet Commonly known as: ZAROXOLYN    sulfamethoxazole -trimethoprim  800-160 MG tablet Commonly known as: BACTRIM  DS       TAKE these medications    acyclovir  400 MG tablet Commonly known as: ZOVIRAX  Take 1 tablet (400 mg total) by mouth 2 (two) times daily.   albuterol  108 (90 Base) MCG/ACT inhaler Commonly known as: VENTOLIN  HFA Inhale 2 puffs into the lungs every 6 (six) hours as needed for wheezing or shortness of breath.   amiodarone  200 MG tablet Commonly known as: PACERONE  Take 1 tablet (200 mg total) by mouth daily.   cholecalciferol  25  MCG (1000 UNIT) tablet Commonly known as: VITAMIN D3 Take 1,000 Units by mouth daily.   diltiazem  120 MG 24 hr capsule Commonly known as: CARDIZEM  CD Take 1 capsule (120 mg total) by mouth daily. Start taking on: March 23, 2024   docusate sodium  100 MG capsule Commonly known as: COLACE Take 1 capsule (100 mg total) by mouth every 12 (twelve) hours.   Eliquis  5 MG Tabs tablet Generic drug: apixaban  TAKE ONE TABLET BY MOUTH TWICE A DAY   Entresto  49-51 MG Generic drug: sacubitril -valsartan  Take 1 tablet by mouth 2 (two) times daily.   FeroSul 325 (65 Fe) MG tablet Generic drug: ferrous sulfate  Take 325 mg by mouth daily.   fluticasone  50 MCG/ACT nasal spray Commonly known as: FLONASE  Place 1 spray into both nostrils daily as needed for allergies or rhinitis.   gabapentin  300 MG capsule Commonly known as:  NEURONTIN  TAKE TWO CAPSULES BY MOUTH TWICE A DAY   hydrALAZINE  50 MG tablet Commonly known as: APRESOLINE  Take 1 tablet (50 mg total) by mouth 3 (three) times daily. What changed:  medication strength how much to take   HYDROmorphone  2 MG tablet Commonly known as: DILAUDID  Take 2 mg by mouth every 4 (four) hours as needed for severe pain (pain score 7-10).   loratadine  10 MG tablet Commonly known as: CLARITIN  Take 10 mg by mouth daily as needed for allergies.   metoprolol  succinate 25 MG 24 hr tablet Commonly known as: TOPROL -XL Take 1 tablet (25 mg total) by mouth daily. Start taking on: March 23, 2024   polyethylene glycol 17 g packet Commonly known as: MiraLax  Take 17 g by mouth daily. What changed:  when to take this reasons to take this   potassium chloride  SA 20 MEQ tablet Commonly known as: KLOR-CON  M TAKE TWO TABLETS BY MOUTH ONE TIME DAILY . THEN TAKE AN EXTRA TABLET ON THE DAY YOU TAKE THE METOLAZONE  What changed: See the new instructions.   sodium chloride  0.65 % Soln nasal spray Commonly known as: OCEAN Place 1 spray into both  nostrils as needed for congestion.   spironolactone  25 MG tablet Commonly known as: ALDACTONE  Take 1 tablet (25 mg total) by mouth daily.   torsemide  20 MG tablet Commonly known as: DEMADEX  Take 1 tablet (20 mg total) by mouth daily. What changed: when to take this   traMADol  50 MG tablet Commonly known as: ULTRAM  Take 50 mg by mouth every 6 (six) hours as needed for moderate pain (pain score 4-6) or severe pain (pain score 7-10).   VITAMIN B-12 PO Take 1,000 mcg by mouth daily.        Allergies  Allergen Reactions   Octagam [Immune Globulin ] Shortness Of Breath and Other (See Comments)    Patient developed hip/back pain, SOB, rigors- see notes 05/13/23   Linezolid  Other (See Comments)    Burning tongue    Vancomycin  Rash    Other Reaction(s): rash, itching, fever, chills    Consultations: Cardiology   Procedures/Studies: ECHOCARDIOGRAM COMPLETE Result Date: 03/20/2024    ECHOCARDIOGRAM REPORT   Patient Name:   HEMI CHACKO Date of Exam: 03/20/2024 Medical Rec #:  [id number]        Height:       70.0 in Accession #:    [id number]       Weight:       242.3 lb Date of Birth:  [birth year]       BSA:          2.265 m Patient Age:    72 years         BP:           134/86 mmHg Patient Gender: M                HR:           117 bpm. Exam Location:  Inpatient Procedure: 2D Echo, Cardiac Doppler and Color Doppler (Both Spectral and Color            Flow Doppler were utilized during procedure). Indications:    Atrial Fibrillation I48.91  History:        Patient has prior history of Echocardiogram examinations, most                 recent 09/08/2022. Arrythmias:Atrial Fibrillation,  Signs/Symptoms:Chest Pain; Risk Factors:Hypertension and Sleep                 Apnea.  Sonographer:    Thea Norlander RCS Referring Phys: CAROLE N HALL IMPRESSIONS  1. Left ventricular ejection fraction, by estimation, is 60 to 65%. The left ventricle has normal function. The left ventricle  has no regional wall motion abnormalities. There is mild left ventricular hypertrophy. Left ventricular diastolic parameters are indeterminate.  2. Right ventricular systolic function is normal. The right ventricular size is normal.  3. The mitral valve is normal in structure. Trivial mitral valve regurgitation.  4. The aortic valve is tricuspid. Aortic valve regurgitation is not visualized. FINDINGS  Left Ventricle: Left ventricular ejection fraction, by estimation, is 60 to 65%. The left ventricle has normal function. The left ventricle has no regional wall motion abnormalities. The left ventricular internal cavity size was normal in size. There is  mild left ventricular hypertrophy. Left ventricular diastolic parameters are indeterminate. Right Ventricle: The right ventricular size is normal. Right vetricular wall thickness was not assessed. Right ventricular systolic function is normal. Left Atrium: Left atrial size was normal in size. Right Atrium: Right atrial size was normal in size. Pericardium: There is no evidence of pericardial effusion. Mitral Valve: The mitral valve is normal in structure. Trivial mitral valve regurgitation. Tricuspid Valve: The tricuspid valve is normal in structure. Tricuspid valve regurgitation is mild. Aortic Valve: The aortic valve is tricuspid. Aortic valve regurgitation is not visualized. Aortic valve peak gradient measures 5.7 mmHg. Pulmonic Valve: The pulmonic valve was not well visualized. Aorta: The aortic root and ascending aorta are structurally normal, with no evidence of dilitation. IAS/Shunts: No atrial level shunt detected by color flow Doppler.  LEFT VENTRICLE PLAX 2D LVIDd:         4.80 cm   Diastology LVIDs:         3.00 cm   LV e' medial:    14.60 cm/s LV PW:         1.30 cm   LV E/e' medial:  6.2 LV IVS:        1.20 cm   LV e' lateral:   19.00 cm/s LVOT diam:     2.50 cm   LV E/e' lateral: 4.8 LV SV:         69 LV SV Index:   30 LVOT Area:     4.91 cm LV IVRT:        39 msec  RIGHT VENTRICLE             IVC RV S prime:     18.30 cm/s  IVC diam: 1.50 cm TAPSE (M-mode): 2.2 cm LEFT ATRIUM             Index        RIGHT ATRIUM           Index LA diam:        3.90 cm 1.72 cm/m   RA Area:     16.30 cm LA Vol (A2C):   39.0 ml 17.22 ml/m  RA Volume:   37.90 ml  16.74 ml/m LA Vol (A4C):   25.5 ml 11.26 ml/m LA Biplane Vol: 31.3 ml 13.82 ml/m  AORTIC VALVE AV Area (Vmax): 3.98 cm AV Vmax:        119.50 cm/s AV Peak Grad:   5.7 mmHg LVOT Vmax:      96.90 cm/s LVOT Vmean:     62.400 cm/s LVOT VTI:  0.140 m  AORTA Ao Root diam: 3.90 cm Ao Asc diam:  3.80 cm MITRAL VALVE MV Area (PHT): 4.86 cm    SHUNTS MV Decel Time: 156 msec    Systemic VTI:  0.14 m MV E velocity: 91.20 cm/s  Systemic Diam: 2.50 cm MV A velocity: 41.90 cm/s MV E/A ratio:  2.18 Vina Gull MD Electronically signed by Vina Gull MD Signature Date/Time: 03/20/2024/7:31:15 PM    Final    DG Chest Portable 1 View Result Date: 03/20/2024 EXAM: 1 VIEW(S) XRAY OF THE CHEST 03/20/2024 01:27:00 AM COMPARISON: None available. CLINICAL HISTORY: afibetc FINDINGS: LUNGS AND PLEURA: No focal pulmonary opacity. No pleural effusion. No pneumothorax. HEART AND MEDIASTINUM: No acute abnormality of the cardiac and mediastinal silhouettes. BONES AND SOFT TISSUES: Cervicothoracic hardware noted. Osseous structures are age appropriate. Mixed lytic and sclerotic expansile lesion within the right sixth rib posteriorly again noted. IMPRESSION: 1. No acute findings. Electronically signed by: Dorethia Molt MD 03/20/2024 01:41 AM EST RP Workstation: [id number]     Discharge Exam: Vitals:   03/22/24 0400 03/22/24 0741  BP: 135/70 127/62  Pulse: (!) 51 (!) 56  Resp: 17 18  Temp: 97.9 F (36.6 C) 98.1 F (36.7 C)  SpO2: 93% 96%   Vitals:   03/22/24 0000 03/22/24 0400 03/22/24 0418 03/22/24 0741  BP: 121/70 135/70  127/62  Pulse: (!) 52 (!) 51  (!) 56  Resp: 18 17  18   Temp: 97.9 F (36.6 C) 97.9 F (36.6 C)  98.1  F (36.7 C)  TempSrc: Oral Oral  Oral  SpO2: 93% 93%  96%  Weight:   109.7 kg   Height:        General: Pt is alert, awake, not in acute distress Cardiovascular: RRR, S1/S2 +, no rubs, no gallops Respiratory: CTA bilaterally, no wheezing, no rhonchi Abdominal: Soft, NT, ND, bowel sounds + Extremities: no edema, no cyanosis    The results of significant diagnostics from this hospitalization (including imaging, microbiology, ancillary and laboratory) are listed below for reference.     Microbiology: Recent Results (from the past 240 hours)  Resp panel by RT-PCR (RSV, Flu A&B, Covid) Anterior Nasal Swab     Status: Abnormal   Collection Time: 03/20/24  1:01 AM   Specimen: Anterior Nasal Swab  Result Value Ref Range Status   SARS Coronavirus 2 by RT PCR NEGATIVE NEGATIVE Final   Influenza A by PCR NEGATIVE NEGATIVE Final   Influenza B by PCR NEGATIVE NEGATIVE Final    Comment: (NOTE) The Xpert Xpress SARS-CoV-2/FLU/RSV plus assay is intended as an aid in the diagnosis of influenza from Nasopharyngeal swab specimens and should not be used as a sole basis for treatment. Nasal washings and aspirates are unacceptable for Xpert Xpress SARS-CoV-2/FLU/RSV testing.  Fact Sheet for Patients: bloggercourse.com  Fact Sheet for Healthcare Providers: seriousbroker.it  This test is not yet approved or cleared by the [place]  FDA and has been authorized for detection and/or diagnosis of SARS-CoV-2 by FDA under an Emergency Use Authorization (EUA). This EUA will remain in effect (meaning this test can be used) for the duration of the COVID-19 declaration under Section 564(b)(1) of the Act, 21 U.S.C. section 360bbb-3(b)(1), unless the authorization is terminated or revoked.     Resp Syncytial Virus by PCR POSITIVE (A) NEGATIVE Final    Comment: (NOTE) Fact Sheet for Patients: bloggercourse.com  Fact Sheet  for Healthcare Providers: seriousbroker.it  This test is not yet approved or cleared by the [place]   FDA and has been authorized for detection and/or diagnosis of SARS-CoV-2 by FDA under an Emergency Use Authorization (EUA). This EUA will remain in effect (meaning this test can be used) for the duration of the COVID-19 declaration under Section 564(b)(1) of the Act, 21 U.S.C. section 360bbb-3(b)(1), unless the authorization is terminated or revoked.  Performed at [facility], [address] [place], [place] [address]      Labs: BNP (last 3 results) Recent Labs    03/20/24 0448 03/21/24 0307 03/21/24 0308  BNP 661.0* 460.7* QUESTIONABLE IDENTIFICATION / INCORRECTLY LABELED SPECIMEN   Basic Metabolic Panel: Recent Labs  Lab 03/20/24 0103 03/21/24 0307 03/22/24 0258  NA 136 137 137  K 3.3* 3.2* 3.3*  CL 101 99 97*  CO2 21* 25 25  GLUCOSE 95 107* 106*  BUN 12 11 14   CREATININE 0.82 0.67 0.74  CALCIUM  7.8* 8.1* 8.4*  MG  --  1.9 2.2   Liver Function Tests: No results for input(s): AST, ALT, ALKPHOS, BILITOT, PROT, ALBUMIN  in the last 168 hours. No results for input(s): LIPASE, AMYLASE in the last 168 hours. No results for input(s): AMMONIA in the last 168 hours. CBC: Recent Labs  Lab 03/20/24 0103 03/21/24 0307 03/22/24 0258  WBC 11.4* 8.5 7.3  NEUTROABS 9.9* 7.4 5.9  HGB 12.0* 11.7* 12.2*  HCT 36.9* 35.4* 37.5*  MCV 95.1 92.9 93.3  PLT 178 151 146*   Cardiac Enzymes: No results for input(s): CKTOTAL, CKMB, CKMBINDEX, TROPONINI in the last 168 hours. BNP: Invalid input(s): POCBNP CBG: No results for input(s): GLUCAP in the last 168 hours. D-Dimer No results for input(s): DDIMER in the last 72 hours. Hgb A1c No results for input(s): HGBA1C in the last 72 hours. Lipid Profile Recent Labs    03/20/24 0654  CHOL 98  HDL 43  LDLCALC 47  TRIG 42  CHOLHDL 2.3   Thyroid  function  studies Recent Labs    03/20/24 0103  TSH 4.633*   Anemia work up No results for input(s): VITAMINB12, FOLATE, FERRITIN, TIBC, IRON, RETICCTPCT in the last 72 hours. Urinalysis    Component Value Date/Time   COLORURINE YELLOW 03/20/2024 0704   APPEARANCEUR HAZY (A) 03/20/2024 0704   LABSPEC 1.014 03/20/2024 0704   PHURINE 5.0 03/20/2024 0704   GLUCOSEU NEGATIVE 03/20/2024 0704   HGBUR NEGATIVE 03/20/2024 0704   BILIRUBINUR NEGATIVE 03/20/2024 0704   KETONESUR 5 (A) 03/20/2024 0704   PROTEINUR 30 (A) 03/20/2024 0704   NITRITE NEGATIVE 03/20/2024 0704   LEUKOCYTESUR NEGATIVE 03/20/2024 0704   Sepsis Labs Recent Labs  Lab 03/20/24 0103 03/21/24 0307 03/22/24 0258  WBC 11.4* 8.5 7.3   Microbiology Recent Results (from the past 240 hours)  Resp panel by RT-PCR (RSV, Flu A&B, Covid) Anterior Nasal Swab     Status: Abnormal   Collection Time: 03/20/24  1:01 AM   Specimen: Anterior Nasal Swab  Result Value Ref Range Status   SARS Coronavirus 2 by RT PCR NEGATIVE NEGATIVE Final   Influenza A by PCR NEGATIVE NEGATIVE Final   Influenza B by PCR NEGATIVE NEGATIVE Final    Comment: (NOTE) The Xpert Xpress SARS-CoV-2/FLU/RSV plus assay is intended as an aid in the diagnosis of influenza from Nasopharyngeal swab specimens and should not be used as a sole basis for treatment. Nasal washings and aspirates are unacceptable for Xpert Xpress SARS-CoV-2/FLU/RSV testing.  Fact Sheet for Patients: bloggercourse.com  Fact Sheet for Healthcare Providers: seriousbroker.it  This test is not yet approved or cleared  by the [place]  FDA and has been authorized for detection and/or diagnosis of SARS-CoV-2 by FDA under an Emergency Use Authorization (EUA). This EUA will remain in effect (meaning this test can be used) for the duration of the COVID-19 declaration under Section 564(b)(1) of the Act, 21 U.S.C. section  360bbb-3(b)(1), unless the authorization is terminated or revoked.     Resp Syncytial Virus by PCR POSITIVE (A) NEGATIVE Final    Comment: (NOTE) Fact Sheet for Patients: bloggercourse.com  Fact Sheet for Healthcare Providers: seriousbroker.it  This test is not yet approved or cleared by the [place]  FDA and has been authorized for detection and/or diagnosis of SARS-CoV-2 by FDA under an Emergency Use Authorization (EUA). This EUA will remain in effect (meaning this test can be used) for the duration of the COVID-19 declaration under Section 564(b)(1) of the Act, 21 U.S.C. section 360bbb-3(b)(1), unless the authorization is terminated or revoked.  Performed at [facility], [address] [place], [place] [address]     FURTHER DISCHARGE INSTRUCTIONS:   Get Medicines reviewed and adjusted: Please take all your medications with you for your next visit with your Primary MD   Laboratory/radiological data: Please request your Primary MD to go over all hospital tests and procedure/radiological results at the follow up, please ask your Primary MD to get all Hospital records sent to his/her office.   In some cases, they will be blood work, cultures and biopsy results pending at the time of your discharge. Please request that your primary care M.D. goes through all the records of your hospital data and follows up on these results.   Also Note the following: If you experience worsening of your admission symptoms, develop shortness of breath, life threatening emergency, suicidal or homicidal thoughts you must seek medical attention immediately by calling 911 or calling your MD immediately  if symptoms less severe.   You must read complete instructions/literature along with all the possible adverse reactions/side effects for all the Medicines you take and that have been prescribed to you. Take any new Medicines after you have  completely understood and accpet all the possible adverse reactions/side effects.    patient was instructed, not to drive, operate heavy machinery, perform activities at heights, swimming or participation in water  activities or provide baby-sitting services while on Pain, Sleep and Anxiety Medications; until their outpatient Physician has advised to do so again. Also recommended to not to take more than prescribed Pain, Sleep and Anxiety Medications.  It is not advisable to combine anxiety, sleep and pain medications without talking with your primary care provider.     Wear Seat belts while driving.   Please note: You were cared for by a hospitalist during your hospital stay. Once you are discharged, your primary care physician will handle any further medical issues. Please note that NO REFILLS for any discharge medications will be authorized once you are discharged, as it is imperative that you return to your primary care physician (or establish a relationship with a primary care physician if you do not have one) for your post hospital discharge needs so that they can reassess your need for medications and monitor your lab values  Time coordinating discharge: Over 30 minutes  SIGNED:   Fredia Skeeter, MD  [facility] 03/22/2024, 10:33 AM *Please note that this is a verbal dictation therefore any spelling or grammatical errors are due to the Dragon Medical One system interpretation. If 7PM-7AM, please contact night-coverage [email or web address]

## 2024-03-22 NOTE — Plan of Care (Signed)

## 2024-03-22 NOTE — TOC Transition Note (Addendum)
 Transition of Care [facility]) - Discharge Note   Patient Details  Name: Travis [name] MRN: [id number] Date of Birth: 1950-07-26  Transition of Care [facility]) CM/SW Contact:  Waddell Barnie Rama, RN Phone Number: 03/22/2024, 10:42 AM   Clinical Narrative:    For dc today, NCM notified Jon with [facility], patient states he will need ambulance transport between 4 and 5 when his wife is home.  PTAR has been scheduled for 5 pm.  NCM spoke with wife to notify of the time, she states 5 pm is fine.         Patient Goals and CMS Choice            Discharge Placement                       Discharge Plan and Services Additional resources added to the After Visit Summary for                                       Social Drivers of Health (SDOH) Interventions SDOH Screenings   Food Insecurity: No Food Insecurity (03/21/2024)  Housing: Low Risk  (03/21/2024)  Transportation Needs: No Transportation Needs (03/21/2024)  Utilities: Not At Risk (03/21/2024)  Depression (PHQ2-9): Low Risk  (02/09/2024)  Financial Resource Strain: High Risk (11/15/2023)   Received from [facility]  Social Connections: Moderately Isolated (03/22/2024)  Tobacco Use: Low Risk  (03/07/2024)     Readmission Risk Interventions    09/27/2022   10:17 AM 06/09/2022   11:52 AM  Readmission Risk Prevention Plan  Transportation Screening Complete Complete  PCP or Specialist Appt within 5-7 Days  Complete  Home Care Screening  Complete  Medication Review (RN CM)  Complete  HRI or Home Care Consult Complete   Social Work Consult for Recovery Care Planning/Counseling Complete   Palliative Care Screening Not Applicable   Medication Review [employer]) Complete   HRI or Home Care Consult Complete   SW Recovery Care/Counseling Consult Complete   Palliative Care Screening Not Applicable   Skilled Nursing Facility Not Applicable

## 2024-03-22 NOTE — Plan of Care (Signed)

## 2024-03-23 ENCOUNTER — Telehealth: Payer: Self-pay | Admitting: *Deleted

## 2024-03-23 NOTE — Telephone Encounter (Signed)
 Received call from pt. He states he was discharged home yesterday.He is glad to be home but feels weak and generally crummy He is asking for his appt next week to be a telephone call vs an in person visit. Discussed with Dr. Federico. Dr. Federico will have [place] Police, PA-C do the phone visit next week.  Johnston Frisk, PA-C made aware.

## 2024-03-24 LAB — MULTIPLE MYELOMA PANEL, SERUM
Albumin SerPl Elph-Mcnc: 2.4 g/dL — ABNORMAL LOW (ref 2.9–4.4)
Albumin/Glob SerPl: 0.9 (ref 0.7–1.7)
Alpha 1: 0.4 g/dL (ref 0.0–0.4)
Alpha2 Glob SerPl Elph-Mcnc: 1.1 g/dL — ABNORMAL HIGH (ref 0.4–1.0)
B-Globulin SerPl Elph-Mcnc: 0.8 g/dL (ref 0.7–1.3)
Gamma Glob SerPl Elph-Mcnc: 0.7 g/dL (ref 0.4–1.8)
Globulin, Total: 3 g/dL (ref 2.2–3.9)
IgA: <5 mg/dL — ABNORMAL LOW (ref 61–437)
IgG (Immunoglobin G), Serum: 890 mg/dL (ref 603–1613)
IgM (Immunoglobulin M), Srm: <5 mg/dL — ABNORMAL LOW (ref 15–143)
Total Protein ELP: 5.4 g/dL — ABNORMAL LOW (ref 6.0–8.5)

## 2024-03-24 LAB — KAPPA/LAMBDA LIGHT CHAINS
Kappa free light chain: <0.7 mg/L — ABNORMAL LOW (ref 3.3–19.4)
Kappa, lambda light chain ratio: UNDETERMINED
Lambda free light chains: <1.5 mg/L — ABNORMAL LOW (ref 5.7–26.3)

## 2024-03-27 ENCOUNTER — Inpatient Hospital Stay

## 2024-03-27 ENCOUNTER — Inpatient Hospital Stay: Attending: Physician Assistant | Admitting: Physician Assistant

## 2024-03-27 ENCOUNTER — Inpatient Hospital Stay: Admitting: Hematology and Oncology

## 2024-03-27 DIAGNOSIS — E876 Hypokalemia: Secondary | ICD-10-CM

## 2024-03-27 DIAGNOSIS — C9000 Multiple myeloma not having achieved remission: Secondary | ICD-10-CM | POA: Diagnosis not present

## 2024-03-27 DIAGNOSIS — G610 Guillain-Barre syndrome: Secondary | ICD-10-CM

## 2024-03-27 NOTE — Progress Notes (Signed)
 [facility] [phone]   [phone]   I connected with Travis [name]  on 03/27/24 by telephone visit and verified that I am speaking with the correct person using two identifiers.   I discussed the limitations, risks, security and privacy concerns of performing an evaluation and management service by telemedicine and the availability of in-person appointments. I also discussed with the patient that there may be a patient responsible charge related to this service. The patient expressed understanding and agreed to proceed.  Patient's location: Home Provider's location: Office  PROGRESS NOTE  Patient Care Team: Charlott Dorn LABOR, MD as PCP - General (Internal Medicine) Shlomo Wilbert SAUNDERS, MD as PCP - Sleep Medicine (Cardiology) Cindie Ole DASEN, MD as PCP - Electrophysiology (Cardiology) Okey Vina GAILS, MD as PCP - Cardiology (Cardiology) Sheldon Standing, MD as Consulting Physician (General Surgery) Rosalie Kitchens, MD as Consulting Physician (Gastroenterology) Shlomo Wilbert SAUNDERS, MD as Consulting Physician (Sleep Medicine) Patel, Donika K, DO as Consulting Physician (Neurology)  Hematological/Oncological History # IgA Kappa Multiple Myeloma 05/05/2020: Bone marrow biopsy showed increased number of plasma cells representing 14% of all cells in the aspirate associated with numerous variably sized clusters in the clot and biopsy sections 04/10/2021: M protein 0.3, IgA kappa specificity. Kappa 580.5, Labda 7.0, ratio 82.93.   12/07/2021: Labs show of 1971.3, lambda 5.5, ratio 358.42 12/10/2021: Transition care to Dr. Federico. 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  01/26/2022: Cycle 2 Day 1 of Dara/Rev/Dex 02/22/2022: Cycle 3 Day 1 of Dara/Rev/Dex 03/23/2022: Cycle 4 Day 1 of Dara/Rev/Dex 04/20/2022: Cycle 5 Day 1 of  Dara/Rev/Dex 05/18/2022: Cycle 6 Day 1 of Dara/Rev/Dex 05/27/2022: MRI cervical/thoracic/lumbar: pathologic fracture at T2 with significant osseous retropulsion and resulting cord compression. Additional smaller lesions within T1 vertebral body, right C7 and T3 articulating facets.  Small Multiple Myeloma lesions scattered at all lumbar levels and in the visible left sacral ala. Largest lumbar tumor is 2.3 cm in the left L1 posterior elements and pedicle. No lumbar extraosseous or epidural tumor extension, or pathologic fracture.Underlying chronic lumbar spine degeneration, with a new central disc herniation at L3-L4 since 2021 now resulting in mild to moderate degenerative spinal stenosis there. 05/28/2022-06/09/2022: Admitted for T2 pathologic fracture and cord compression. He received urgent radiation therapy for a total of 3.0 Gy. He underwent  thoracic 1 through 4 posterior lateral arthrodesis/fusion and 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  06/23/2022: Cycle 1 Day 1 of Velcade /Pomalyst /Dex 08/04/2022: Cycle 2 Day 1 of Velcade /Pomalyst /Dex. Pomalyst  to be decreased to 2mg  PO daily due to cytopenias.  09/01/2022: Cycle 3 Day 1 of Velcade /Pomalyst /Dex. (Missed 2nd dose of cycle due to hospitalization)  10/06/2022: Cycle 4 Day 1 of Velcade /Pomalyst /Dex.  10/27/2022: Cycle 5 Day 1 of Velcade /Pomalyst /Dex.  12/02/2022: Cycle 6 Day 1 of Velcade /Pomalyst /Dex. 01/12/2023: Cycle 1 Day 1 of Kyprolis /Dex 02/09/2023: Cycle 2 Day 1 of Kyprolis /Dex   04/07/2023: Transitioned to selinexor  and Pomalyst  per request of transplant team due to progression on Kyprolis  and dexamethasone  06/27/2023: start of Carvykti.  Complicated by Guillain-Barr  Interval History:  Travis [name] 73 y.o. male with medical history significant for IgA Kappa Multiple Myeloma who presents for a follow up visit. He was last seen on 12/28/2023. 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    Mr.  Travis [name] reports he is slowly recovering from recent hospitalization and  RSV infection along with larygnitis. His energy and appetite are slowly improving. He continues to require a wheelchair for ambulation due GBS. He has flare ups with his back pain especially with the cooler weather. He denies nausea, vomiting or bowel habit changes. He denies easy bruising or signs of bleeding. He has occasional fevers with Tmax of 100 F that is treated with tylenol . He denies chills, sweats, shortness of breath, chest pain or cough. He has no other complaints.   MEDICAL HISTORY:  Past Medical History:  Diagnosis Date   A-fib [facility])    Arthritis    comes and goes; mostly in hands, occasionally elbow (04/05/2017)   Complication of anesthesia    just wanted to sleep alot  for couple days S/P VARICOCELE OR (04/05/2017)   DVT (deep venous thrombosis) (HCC)    LLE   Dysrhythmia    PVCs    GERD (gastroesophageal reflux disease)    History of hiatal hernia    History of radiation therapy    Thoracic Spine- 07/13/22-07/23/22-Dr. Lynwood Nasuti   Hypertension    Impaired glucose tolerance    Multiple myeloma (HCC)    Obesity (BMI 30-39.9) 07/26/2016   OSA on CPAP 07/26/2016   Mild with AHI 12.5/hr now on CPAP at 14cm H2O   PAF (paroxysmal atrial fibrillation) (HCC)    s/p PVI Ablation in 2018 // Flecainide  Rx // Echo 8/21: EF 60-65, no RWMA, mild LVH, normal RV SF, moderate RVE, mild BAE, trivial MR, mild dilation of aortic root (40 mm)   Pneumonia    may have had walking pneumonia a few years ago (04/05/2017)   Pre-diabetes    Thrombophlebitis     SURGICAL HISTORY: Past Surgical History:  Procedure Laterality Date   ATRIAL FIBRILLATION ABLATION  04/05/2017   ATRIAL FIBRILLATION ABLATION N/A 04/05/2017   Procedure: ATRIAL FIBRILLATION ABLATION;  Surgeon: Kelsie Lynwood, MD;  [facility]: [facility];  Service: Cardiovascular;  Laterality: N/A;   BONE BIOPSY Right 12/17/2021   Procedure: BONE BIOPSY;   Surgeon: Cristy Bonner DASEN, MD;  Location: [facility];  Service: Orthopedics;  Laterality: Right;   COMPLETE RIGHT HIP REPLACMENT  01/19/2019   EVALUATION UNDER ANESTHESIA WITH HEMORRHOIDECTOMY N/A 02/24/2017   Procedure: EXAM UNDER ANESTHESIA WITH  HEMORRHOIDECTOMY;  Surgeon: Sheldon Standing, MD;  Location: [facility];  Service: General;  Laterality: N/A;   EXCISIONAL TOTAL HIP ARTHROPLASTY WITH ANTIBIOTIC SPACERS Right 09/25/2020   Procedure: EXCISIONAL TOTAL HIP ARTHROPLASTY WITH ANTIBIOTIC SPACERS;  Surgeon: Ernie Cough, MD;  Location: [facility];  Service: Orthopedics;  Laterality: Right;   FLEXIBLE SIGMOIDOSCOPY N/A 04/15/2015   Procedure:  UNSEDATED FLEXIBLE SIGMOIDOSCOPY;  Surgeon: Gladis MARLA Louder, MD;  Location: [facility];  Service: Endoscopy;  Laterality: N/A;   HUMERUS IM NAIL Right 12/17/2021   Procedure: INTRAMEDULLARY (IM) NAIL HUMERAL;  Surgeon: Cristy Bonner DASEN, MD;  Location: [facility];  Service: Orthopedics;  Laterality: Right;   KNEE ARTHROSCOPY Left ~ 2016   POSTERIOR CERVICAL FUSION/FORAMINOTOMY N/A 06/04/2022   Procedure: Thoracic one - Thoracic Four Posterior lateral arthrodesis/ fusion and Thoracic two Corpectomy;  Surgeon: Gillie Duncans, MD;  Location: [facility];  Service: Neurosurgery;  Laterality: N/A;   REIMPLANTATION OF TOTAL HIP Right 12/25/2020   Procedure: REIMPLANTATION/REVISION OF RIGHT TOTAL HIP VERSUS REPEAT IRRIGATION AND DEBRIDEMENT;  Surgeon: Ernie Cough, MD;  Location: [facility];  Service: Orthopedics;  Laterality: Right;   TESTICLE SURGERY  1988   VARICOCELE   TONSILLECTOMY  VARICOSE VEIN SURGERY Bilateral     SOCIAL HISTORY: Social History   Socioeconomic History   Marital status: Married    Spouse name: Not on file   Number of children: 1   Years of education: law school   Highest education level: Not on file  Occupational History   Occupation: lawyer  Tobacco Use   Smoking status: Never   Smokeless tobacco: Never  Vaping Use    Vaping status: Never Used  Substance and Sexual Activity   Alcohol use: Yes    Alcohol/week: 2.0 standard drinks of alcohol    Types: 2 Glasses of wine per week    Comment: 2 glasses of wine a week, occ bourbon, beer   Drug use: No   Sexual activity: Not Currently  Other Topics Concern   Not on file  Social History Narrative   Lives in [place] with spouse in a 2 story home.  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 1 daughter.     Education: social worker school.    Social Drivers of Corporate Investment Banker Strain: High Risk (11/15/2023)   Received from [facility]   Overall Financial Resource Strain (CARDIA)    Difficulty of Paying Living Expenses: Very hard  Food Insecurity: No Food Insecurity (03/21/2024)   Hunger Vital Sign    Worried About Running Out of Food in the Last Year: Never true    Ran Out of Food in the Last Year: Never true  Transportation Needs: No Transportation Needs (03/21/2024)   PRAPARE - Administrator, Civil Service (Medical): No    Lack of Transportation (Non-Medical): No  Physical Activity: Not on file  Stress: Not on file  Social Connections: Moderately Isolated (03/22/2024)   Social Connection and Isolation Panel    Frequency of Communication with Friends and Family: Twice a week    Frequency of Social Gatherings with Friends and Family: Once a week    Attends Religious Services: Never    Database Administrator or Organizations: No    Attends Banker Meetings: Never    Marital Status: Married  Catering Manager Violence: Not At Risk (03/21/2024)   Humiliation, Afraid, Rape, and Kick questionnaire    Fear of Current or Ex-Partner: No    Emotionally Abused: No    Physically Abused: No    Sexually Abused: No    FAMILY HISTORY: Family History  Problem Relation Age of Onset   Dementia Mother    Stroke Father    Atrial fibrillation Father    Hypertension Father    Hypertension  Paternal Grandfather    Dementia Maternal Grandmother     ALLERGIES:  is allergic to octagam [immune globulin ], linezolid , and vancomycin .  MEDICATIONS:  Current Outpatient Medications  Medication Sig Dispense Refill   acyclovir  (ZOVIRAX ) 400 MG tablet Take 1 tablet (400 mg total) by mouth 2 (two) times daily. 60 tablet 5   albuterol  (VENTOLIN  HFA) 108 (90 Base) MCG/ACT inhaler Inhale 2 puffs into the lungs every 6 (six) hours as needed for wheezing or shortness of breath. 8 g 0   amiodarone  (PACERONE ) 200 MG tablet Take 1 tablet (200 mg total) by mouth daily. 90 tablet 2   cholecalciferol  (VITAMIN D3) 25 MCG (1000 UNIT) tablet Take 1,000 Units by mouth daily.     Cyanocobalamin  (VITAMIN B-12 PO) Take 1,000 mcg by mouth daily.     diltiazem  (CARDIZEM  CD) 120 MG 24 hr capsule  Take 1 capsule (120 mg total) by mouth daily. 30 capsule 0   docusate sodium  (COLACE) 100 MG capsule Take 1 capsule (100 mg total) by mouth every 12 (twelve) hours. (Patient not taking: Reported on 03/20/2024) 60 capsule 0   ELIQUIS  5 MG TABS tablet TAKE ONE TABLET BY MOUTH TWICE A DAY 60 tablet 6   FEROSUL 325 (65 Fe) MG tablet Take 325 mg by mouth daily.     fluticasone  (FLONASE ) 50 MCG/ACT nasal spray Place 1 spray into both nostrils daily as needed for allergies or rhinitis.     gabapentin  (NEURONTIN ) 300 MG capsule TAKE TWO CAPSULES BY MOUTH TWICE A DAY 360 capsule 1   hydrALAZINE  (APRESOLINE ) 50 MG tablet Take 1 tablet (50 mg total) by mouth 3 (three) times daily. 90 tablet 0   HYDROmorphone  (DILAUDID ) 2 MG tablet Take 2 mg by mouth every 4 (four) hours as needed for severe pain (pain score 7-10).     loratadine  (CLARITIN ) 10 MG tablet Take 10 mg by mouth daily as needed for allergies.     metoprolol  succinate (TOPROL -XL) 25 MG 24 hr tablet Take 1 tablet (25 mg total) by mouth daily. 30 tablet 0   polyethylene glycol (MIRALAX ) 17 g packet Take 17 g by mouth daily. (Patient taking differently: Take 17 g by mouth  daily as needed for mild constipation or moderate constipation.) 14 each 0   potassium chloride  SA (KLOR-CON  M) 20 MEQ tablet TAKE TWO TABLETS BY MOUTH ONE TIME DAILY . THEN TAKE AN EXTRA TABLET ON THE DAY YOU TAKE THE METOLAZONE  (Patient taking differently: Take 40 mEq by mouth See admin instructions. Take two tablets by mouth in the morning daily. Take two tablets by mouth every other day in the evening. Take one tablet by mouth every other day in the evening when not taking the two tablets in the evening.) 180 tablet 3   sacubitril -valsartan  (ENTRESTO ) 49-51 MG Take 1 tablet by mouth 2 (two) times daily.     sodium chloride  (OCEAN) 0.65 % SOLN nasal spray Place 1 spray into both nostrils as needed for congestion.     spironolactone  (ALDACTONE ) 25 MG tablet Take 1 tablet (25 mg total) by mouth daily. 30 tablet 0   torsemide  (DEMADEX ) 20 MG tablet Take 1 tablet (20 mg total) by mouth daily. (Patient taking differently: Take 20 mg by mouth every other day.) 45 tablet 3   traMADol  (ULTRAM ) 50 MG tablet Take 50 mg by mouth every 6 (six) hours as needed for moderate pain (pain score 4-6) or severe pain (pain score 7-10).     No current facility-administered medications for this visit.    REVIEW OF SYSTEMS:   Constitutional: ( - ) fevers, ( - )  chills , ( - ) night sweats Eyes: ( - ) blurriness of vision, ( - ) double vision, ( - ) watery eyes Ears, nose, mouth, throat, and face: ( - ) mucositis, ( - ) sore throat Respiratory: ( - ) cough, ( - ) dyspnea, ( - ) wheezes Cardiovascular: ( - ) palpitation, ( - ) chest discomfort, ( - ) lower extremity swelling Gastrointestinal:  ( - ) nausea, ( - ) heartburn, ( - ) change in bowel habits Skin: ( - ) abnormal skin rashes Lymphatics: ( - ) new lymphadenopathy, ( - ) easy bruising Neurological: ( - ) numbness, ( - ) tingling, ( - ) new weaknesses Behavioral/Psych: ( - ) mood change, ( - ) new changes  All other systems were reviewed with the patient and  are negative.  PHYSICAL EXAMINATION: Not performed due to virtual visit  LABORATORY DATA:  I have reviewed the data as listed    Latest Ref Rng & Units 03/22/2024    2:58 AM 03/21/2024    3:07 AM 03/20/2024    1:03 AM  CBC  WBC 4.0 - 10.5 K/uL 7.3  8.5  11.4   Hemoglobin 13.0 - 17.0 g/dL 87.7  88.2  87.9   Hematocrit 39.0 - 52.0 % 37.5  35.4  36.9   Platelets 150 - 400 K/uL 146  151  178        Latest Ref Rng & Units 03/22/2024    2:58 AM 03/21/2024    3:07 AM 03/20/2024    1:03 AM  CMP  Glucose 70 - 99 mg/dL 893  892  95   BUN 8 - 23 mg/dL 14  11  12    Creatinine 0.61 - 1.24 mg/dL 9.25  9.32  9.17   Sodium 135 - 145 mmol/L 137  137  136   Potassium 3.5 - 5.1 mmol/L 3.3  3.2  3.3   Chloride 98 - 111 mmol/L 97  99  101   CO2 22 - 32 mmol/L 25  25  21    Calcium  8.9 - 10.3 mg/dL 8.4  8.1  7.8     Lab Results  Component Value Date   MPROTEIN Not Observed 03/22/2024   MPROTEIN Not Observed 02/02/2024   MPROTEIN Not Observed 12/28/2023   Lab Results  Component Value Date   KPAFRELGTCHN <0.7 (L) 03/22/2024   KPAFRELGTCHN 0.7 (L) 02/02/2024   KPAFRELGTCHN 1.0 (L) 12/28/2023   LAMBDASER <1.5 (L) 03/22/2024   LAMBDASER <1.5 (L) 02/02/2024   LAMBDASER <1.5 (L) 12/28/2023   KAPLAMBRATIO UNABLE TO CALCULATE 03/22/2024   KAPLAMBRATIO >0.47 02/02/2024   KAPLAMBRATIO >0.67 12/28/2023     RADIOGRAPHIC STUDIES: ECHOCARDIOGRAM COMPLETE Result Date: 03/20/2024    ECHOCARDIOGRAM REPORT   Patient Name:   KIERON KANTNER Date of Exam: 03/20/2024 Medical Rec #:  [id number]        Height:       70.0 in Accession #:    [id number]       Weight:       242.3 lb Date of Birth:  May 20, 1950       BSA:          2.265 m Patient Age:    72 years         BP:           134/86 mmHg Patient Gender: M                HR:           117 bpm. Exam Location:  Inpatient Procedure: 2D Echo, Cardiac Doppler and Color Doppler (Both Spectral and Color            Flow Doppler were utilized during procedure).  Indications:    Atrial Fibrillation I48.91  History:        Patient has prior history of Echocardiogram examinations, most                 recent 09/08/2022. Arrythmias:Atrial Fibrillation,                 Signs/Symptoms:Chest Pain; Risk Factors:Hypertension and Sleep                 Apnea.  Sonographer:    Thea Norlander  RCS Referring Phys: CAROLE N HALL IMPRESSIONS  1. Left ventricular ejection fraction, by estimation, is 60 to 65%. The left ventricle has normal function. The left ventricle has no regional wall motion abnormalities. There is mild left ventricular hypertrophy. Left ventricular diastolic parameters are indeterminate.  2. Right ventricular systolic function is normal. The right ventricular size is normal.  3. The mitral valve is normal in structure. Trivial mitral valve regurgitation.  4. The aortic valve is tricuspid. Aortic valve regurgitation is not visualized. FINDINGS  Left Ventricle: Left ventricular ejection fraction, by estimation, is 60 to 65%. The left ventricle has normal function. The left ventricle has no regional wall motion abnormalities. The left ventricular internal cavity size was normal in size. There is  mild left ventricular hypertrophy. Left ventricular diastolic parameters are indeterminate. Right Ventricle: The right ventricular size is normal. Right vetricular wall thickness was not assessed. Right ventricular systolic function is normal. Left Atrium: Left atrial size was normal in size. Right Atrium: Right atrial size was normal in size. Pericardium: There is no evidence of pericardial effusion. Mitral Valve: The mitral valve is normal in structure. Trivial mitral valve regurgitation. Tricuspid Valve: The tricuspid valve is normal in structure. Tricuspid valve regurgitation is mild. Aortic Valve: The aortic valve is tricuspid. Aortic valve regurgitation is not visualized. Aortic valve peak gradient measures 5.7 mmHg. Pulmonic Valve: The pulmonic valve was not well  visualized. Aorta: The aortic root and ascending aorta are structurally normal, with no evidence of dilitation. IAS/Shunts: No atrial level shunt detected by color flow Doppler.  LEFT VENTRICLE PLAX 2D LVIDd:         4.80 cm   Diastology LVIDs:         3.00 cm   LV e' medial:    14.60 cm/s LV PW:         1.30 cm   LV E/e' medial:  6.2 LV IVS:        1.20 cm   LV e' lateral:   19.00 cm/s LVOT diam:     2.50 cm   LV E/e' lateral: 4.8 LV SV:         69 LV SV Index:   30 LVOT Area:     4.91 cm LV IVRT:       39 msec  RIGHT VENTRICLE             IVC RV S prime:     18.30 cm/s  IVC diam: 1.50 cm TAPSE (M-mode): 2.2 cm LEFT ATRIUM             Index        RIGHT ATRIUM           Index LA diam:        3.90 cm 1.72 cm/m   RA Area:     16.30 cm LA Vol (A2C):   39.0 ml 17.22 ml/m  RA Volume:   37.90 ml  16.74 ml/m LA Vol (A4C):   25.5 ml 11.26 ml/m LA Biplane Vol: 31.3 ml 13.82 ml/m  AORTIC VALVE AV Area (Vmax): 3.98 cm AV Vmax:        119.50 cm/s AV Peak Grad:   5.7 mmHg LVOT Vmax:      96.90 cm/s LVOT Vmean:     62.400 cm/s LVOT VTI:       0.140 m  AORTA Ao Root diam: 3.90 cm Ao Asc diam:  3.80 cm MITRAL VALVE MV Area (PHT): 4.86 cm    SHUNTS MV  Decel Time: 156 msec    Systemic VTI:  0.14 m MV E velocity: 91.20 cm/s  Systemic Diam: 2.50 cm MV A velocity: 41.90 cm/s MV E/A ratio:  2.18 Vina Gull MD Electronically signed by Vina Gull MD Signature Date/Time: 03/20/2024/7:31:15 PM    Final    DG Chest Portable 1 View Result Date: 03/20/2024 EXAM: 1 VIEW(S) XRAY OF THE CHEST 03/20/2024 01:27:00 AM COMPARISON: None available. CLINICAL HISTORY: afibetc FINDINGS: LUNGS AND PLEURA: No focal pulmonary opacity. No pleural effusion. No pneumothorax. HEART AND MEDIASTINUM: No acute abnormality of the cardiac and mediastinal silhouettes. BONES AND SOFT TISSUES: Cervicothoracic hardware noted. Osseous structures are age appropriate. Mixed lytic and sclerotic expansile lesion within the right sixth rib posteriorly again noted.  IMPRESSION: 1. No acute findings. Electronically signed by: Dorethia Molt MD 03/20/2024 01:41 AM EST RP Workstation: [id number]     ASSESSMENT & PLAN JAMANI BEARCE is a 73 y.o. male with medical history significant for IgA Kappa Multiple Myeloma who presents for a follow up visit.   # IgA Kappa Multiple Myeloma --Metastatic survey completed 12/15/2021 --Patient underwent fixation of his humerus on 12/17/2021, pathology confirmed plasmacytoma/plasma cell neoplasm.  --Received 6 cycles of Darzalex , Revlimid , and dexamethasone  started on 12/28/2021-05/18/2022.  --Due to worsening myeloma osseous lesions including pathologic fracture at T2 with cord compression, Dr. Federico recommends changing therapy to Velcade /Pomalyst /Dex.  --Due to rising kappa free light chains, recommend to change therapy to Carfilzomib /Dex started on 01/12/2023. Plan: --s/p CAR-T at [facility] on 06/27/2023 --labs from 03/22/2024 show WBC 7.3, Hgb 12.2., Plt 146K, creatinine 0.74. Myeloma panel shows M protein is not detectable and sFLC are not elevated.  --Continue with monthly IVIG therapy for his Guillain-Barr per [facility] recommendations --RTC in 3 months with labs and follow up  # Neck pain and mid back pain: --Suspect pain is exacerbated from recent hospitalization and being in bed/sitting for long periods.  --MRI C/T/L spine obtinaed that showed unchanged lytic lesions in the right C7 inferior articular process,T6, T8, and T10  vertebral bodies, as well  as the T11 spinous process. Unchanged  multilevel cervical spondylsis, Interval enlargement of disc herniation at L3-L4 with progressive spinal canal stenosis. No new or progressive lytic lesions.  --Encouraged to continue with physical therapy and follow up with ortho.   #Palliative Radiation Therapy: # Dysphagia --Received palliative radiation from 01/06/2022-01/19/2022 to right hymerus, right forearm and right chest/rib, 20 Gy in 10 Fx.  --recieved  palliative radiation to surgical bed in the upper thoracic spine area on 07/13/2022.  27 Gy in 9 Fx.   #Lower extremity edema--improved: --Currently on lasix  and torsemide   # Rash Concerning for Shingles-resolved -- Prescribed valacyclovir  3 times daily 1000 mg x 10 days-completed the course -- We will continue to monitor rash closely.  #DVT: --Found to have acute DVT involving left peroneal veins. Likely secondary to recent surgery and hospitalization --Currently on eliquis  5 mg BID.    #Hypokalemia: --Potassium level from 03/22/24 was 3.3. Advised to continue with 40 mEq daily and increase to 60 mEq on the days he takes torsemide /spironolactone   #Rash on scalp and right forearm: --Recommend topical hydrocortisone cream for spot treatment -- Additionally for his IVIG rash recommend Eucerin and Polysporin.  #Supportive Care -- chemotherapy education complete -- port placement not required -- zofran  8mg  q8H PRN and compazine  10mg  PO q6H for nausea -- acyclovir  400mg  PO BID for VCZ prophylaxis -- Will plan to restart Zometa  therapy as soon as is feasible  No orders of the defined types were placed in this encounter.  All questions were answered. The patient knows to call the clinic with any problems, questions or concerns.   I have spent a total of 20 minutes minutes of non-face-to-face time, preparing to see the patient, obtaining and/or reviewing separately obtained history, counseling and educating the patient,documenting clinical information in the electronic health record, independently interpreting results and communicating results to the patient, and care coordination.   Johnston Police PA-C Dept of Hematology and Oncology [facility] Phone: [phone]   03/27/2024 4:31 PM

## 2024-04-02 ENCOUNTER — Inpatient Hospital Stay: Admitting: Hematology and Oncology

## 2024-04-02 ENCOUNTER — Inpatient Hospital Stay

## 2024-04-06 ENCOUNTER — Other Ambulatory Visit: Payer: Self-pay | Admitting: Physician Assistant

## 2024-04-08 ENCOUNTER — Other Ambulatory Visit: Payer: Self-pay | Admitting: Hematology and Oncology

## 2024-04-09 ENCOUNTER — Other Ambulatory Visit (HOSPITAL_COMMUNITY): Payer: Self-pay | Admitting: Physician Assistant

## 2024-04-10 ENCOUNTER — Inpatient Hospital Stay: Attending: Physician Assistant

## 2024-04-10 ENCOUNTER — Inpatient Hospital Stay

## 2024-04-10 VITALS — BP 125/66 | HR 58 | Temp 97.8°F | Resp 16 | Wt 236.0 lb

## 2024-04-10 DIAGNOSIS — R600 Localized edema: Secondary | ICD-10-CM | POA: Insufficient documentation

## 2024-04-10 DIAGNOSIS — R1310 Dysphagia, unspecified: Secondary | ICD-10-CM | POA: Diagnosis not present

## 2024-04-10 DIAGNOSIS — Z7901 Long term (current) use of anticoagulants: Secondary | ICD-10-CM | POA: Diagnosis not present

## 2024-04-10 DIAGNOSIS — C9000 Multiple myeloma not having achieved remission: Secondary | ICD-10-CM | POA: Diagnosis present

## 2024-04-10 DIAGNOSIS — Z79899 Other long term (current) drug therapy: Secondary | ICD-10-CM | POA: Insufficient documentation

## 2024-04-10 DIAGNOSIS — E876 Hypokalemia: Secondary | ICD-10-CM | POA: Insufficient documentation

## 2024-04-10 DIAGNOSIS — I82452 Acute embolism and thrombosis of left peroneal vein: Secondary | ICD-10-CM | POA: Diagnosis not present

## 2024-04-10 DIAGNOSIS — M542 Cervicalgia: Secondary | ICD-10-CM | POA: Insufficient documentation

## 2024-04-10 DIAGNOSIS — M5489 Other dorsalgia: Secondary | ICD-10-CM | POA: Insufficient documentation

## 2024-04-10 LAB — CMP (CANCER CENTER ONLY)
ALT: 33 U/L (ref 0–44)
AST: 45 U/L — ABNORMAL HIGH (ref 15–41)
Albumin: 3.7 g/dL (ref 3.5–5.0)
Alkaline Phosphatase: 228 U/L — ABNORMAL HIGH (ref 38–126)
Anion gap: 10 (ref 5–15)
BUN: 19 mg/dL (ref 8–23)
CO2: 26 mmol/L (ref 22–32)
Calcium: 9.6 mg/dL (ref 8.9–10.3)
Chloride: 105 mmol/L (ref 98–111)
Creatinine: 0.65 mg/dL (ref 0.61–1.24)
GFR, Estimated: >60 mL/min (ref >60)
Glucose, Bld: 111 mg/dL — ABNORMAL HIGH (ref 70–99)
Potassium: 4 mmol/L (ref 3.5–5.1)
Sodium: 141 mmol/L (ref 135–145)
Total Bilirubin: 0.3 mg/dL (ref 0.0–1.2)
Total Protein: 6.7 g/dL (ref 6.5–8.1)

## 2024-04-10 LAB — CBC WITH DIFFERENTIAL (CANCER CENTER ONLY)
Abs Immature Granulocytes: 0.02 K/uL (ref 0.00–0.07)
Basophils Absolute: 0 K/uL (ref 0.0–0.1)
Basophils Relative: 1 %
Eosinophils Absolute: 0.1 K/uL (ref 0.0–0.5)
Eosinophils Relative: 1 %
HCT: 36.4 % — ABNORMAL LOW (ref 39.0–52.0)
Hemoglobin: 11.8 g/dL — ABNORMAL LOW (ref 13.0–17.0)
Immature Granulocytes: 0 %
Lymphocytes Relative: 6 %
Lymphs Abs: 0.5 K/uL — ABNORMAL LOW (ref 0.7–4.0)
MCH: 30.6 pg (ref 26.0–34.0)
MCHC: 32.4 g/dL (ref 30.0–36.0)
MCV: 94.5 fL (ref 80.0–100.0)
Monocytes Absolute: 1 K/uL (ref 0.1–1.0)
Monocytes Relative: 12 %
Neutro Abs: 6.5 K/uL (ref 1.7–7.7)
Neutrophils Relative %: 80 %
Platelet Count: 209 K/uL (ref 150–400)
RBC: 3.85 MIL/uL — ABNORMAL LOW (ref 4.22–5.81)
RDW: 17.2 % — ABNORMAL HIGH (ref 11.5–15.5)
WBC Count: 8 K/uL (ref 4.0–10.5)
nRBC: 0 % (ref 0.0–0.2)

## 2024-04-10 LAB — LACTATE DEHYDROGENASE: LDH: 357 U/L — ABNORMAL HIGH (ref 105–235)

## 2024-04-10 MED ORDER — ACETAMINOPHEN 325 MG PO TABS
650.0000 mg | ORAL_TABLET | Freq: Once | ORAL | Status: AC
  Administered 2024-04-10: 650 mg via ORAL
  Filled 2024-04-10: qty 2

## 2024-04-10 MED ORDER — DEXTROSE 5 % IV SOLN: INTRAVENOUS | Status: DC

## 2024-04-10 MED ORDER — DIPHENHYDRAMINE HCL 25 MG PO CAPS
25.0000 mg | ORAL_CAPSULE | Freq: Once | ORAL | Status: AC
  Administered 2024-04-10: 25 mg via ORAL
  Filled 2024-04-10: qty 1

## 2024-04-10 MED ORDER — FAMOTIDINE IN NACL 20-0.9 MG/50ML-% IV SOLN
20.0000 mg | Freq: Once | INTRAVENOUS | Status: AC
  Administered 2024-04-10: 20 mg via INTRAVENOUS
  Filled 2024-04-10: qty 50

## 2024-04-10 MED ORDER — GAMMAGARD 10 GM/100ML IJ SOLN
100.0000 g | Freq: Once | INTRAMUSCULAR | Status: AC
  Administered 2024-04-10: 100 g via INTRAVENOUS
  Filled 2024-04-10: qty 100

## 2024-04-10 NOTE — Progress Notes (Signed)
 Per Wilma, RPH, okay to do max rate Gammagard  10% (480mg /kg/hr)

## 2024-04-10 NOTE — Progress Notes (Signed)
 Pt observed for 30 min post IVIG infusion. Tolerated infusion well. VSS at time of d/c. Pt transported to front of [facility] via wheelchair with family.

## 2024-04-10 NOTE — Progress Notes (Signed)
 Pt to receive IVIG x 2 days. Orders added for D2 infusion on 12/10 per Irene Thayil, PA-C.  Braven Wolk, PharmD, MBA

## 2024-04-10 NOTE — Patient Instructions (Signed)
 Immune Globulin  Injection What is this medication? IMMUNE GLOBULIN  (im MUNE GLOB yoo lin) treats many immune system conditions. It works by [employer], multimedia extra antibodies. Antibodies are proteins made by the immune system that help protect the body. This medicine may be used for other purposes; ask your health care provider or pharmacist if you have questions. COMMON BRAND NAME(S): ASCENIV, Baygam, BIVIGAM, Carimune, Carimune NF, cutaquig, Cuvitru, Flebogamma, Flebogamma DIF, GamaSTAN, GamaSTAN S/D, Gamimune N, Gammagard, Gammagard S/D, Gammaked, Gammaplex, Gammar-P IV, Gamunex, Gamunex-C, Hizentra, Iveegam, Iveegam EN, Octagam, Panglobulin, Panglobulin NF, panzyga, Polygam S/D, Privigen , Sandoglobulin, Venoglobulin-S, Vigam, Vivaglobulin, Xembify What should I tell my care team before I take this medication? They need to know if you have any of these conditions: Blood clotting disorder Condition where you have excess fluid in your body, such as heart failure or edema Dehydration Diabetes Have had blood clots Heart disease Immune system conditions Kidney disease Low levels of IgA Recent or upcoming vaccine An unusual or allergic reaction to immune globulin , other medications, foods, dyes, or preservatives Pregnant or trying to get pregnant Breastfeeding How should I use this medication? This medication is infused into a vein or under the skin. It may also be injected into a muscle. It is usually given by your care team in a hospital or clinic setting. It may also be given at home. If you get this medication at home, you will be taught how to prepare and give it. Take it as directed on the prescription label. Keep taking it unless your care team tells you to stop. It is important that you put your used needles and syringes in a special sharps container. Do not put them in a trash can. If you do not have a sharps container, call your pharmacist or care team to get one. Talk to your care team  about the use of this medication in children. While it may be given to children for selected conditions, precautions do apply. Overdosage: If you think you have taken too much of this medicine contact a poison control center or emergency room at once. NOTE: This medicine is only for you. Do not share this medicine with others. What if I miss a dose? If you get this medication at the hospital or clinic: It is important not to miss your dose. Call your care team if you are unable to keep an appointment. If you give yourself this medication at home: If you miss a dose, take it as soon as you can. Then continue your normal schedule. If it is almost time for your next dose, take only that dose. Do not take double or extra doses. Call your care team with questions. What may interact with this medication? Live virus vaccines This list may not describe all possible interactions. Give your health care provider a list of all the medicines, herbs, non-prescription drugs, or dietary supplements you use. Also tell them if you smoke, drink alcohol , or use illegal drugs. Some items may interact with your medicine. What should I watch for while using this medication? Your condition will be monitored carefully while you are receiving this medication. Tell your care team if your symptoms do not start to get better or if they get worse. You may need blood work done while you are taking this medication. This medication increases the risk of blood clots. People with heart, blood vessel, or blood clotting conditions are more likely to develop a blood clot. Other risk factors include advanced age, estrogen use, tobacco  use, lack of movement, and being overweight. This medication can decrease the response to a vaccine. If you need to get vaccinated, tell your care team if you have received this medication within the last year. Extra booster doses may be needed. Talk to your care team to see if a different vaccination schedule  is needed. This medication is made from donated human blood. There is a small risk it may contain bacteria or viruses, such as hepatitis or HIV. All products are processed to kill most bacteria and viruses. Talk to your care team if you have questions about the risk of infection. If you have diabetes, talk to your care team about which device you should use to check your blood sugar. This medication may cause some devices to report falsely high blood sugar levels. This may cause you to react by not treating a low blood sugar level or by giving an insulin  dose that was not needed. This can cause severe low blood sugar levels. What side effects may I notice from receiving this medication? Side effects that you should report to your care team as soon as possible: Allergic reactions--skin rash, itching, hives, swelling of the face, lips, tongue, or throat Blood clot--pain, swelling, or warmth in the leg, shortness of breath, chest pain Fever, neck pain or stiffness, sensitivity to light, headache, nausea, vomiting, confusion, which may be signs of meningitis Hemolytic anemia--unusual weakness or fatigue, dizziness, headache, trouble breathing, dark urine, yellowing skin or eyes Kidney injury--decrease in the amount of urine, swelling of the ankles, hands, or feet Low sodium level--muscle weakness, fatigue, dizziness, headache, confusion Shortness of breath or trouble breathing, cough, unusual weakness or fatigue, blue skin or lips Side effects that usually do not require medical attention (report these to your care team if they continue or are bothersome): Chills Diarrhea Fever Headache Nausea This list may not describe all possible side effects. Call your doctor for medical advice about side effects. You may report side effects to FDA at 1-800-FDA-1088. Where should I keep my medication? Keep out of the reach of children and pets. You will be instructed on how to store this medication. Get rid of  any unused medication after the expiration date. To get rid of medications that are no longer needed or have expired: Take the medication to a medication take-back program. Check with your pharmacy or law enforcement to find a location. If you cannot return the medication, ask your pharmacist or care team how to get rid of this medication safely. NOTE: This sheet is a summary. It may not cover all possible information. If you have questions about this medicine, talk to your doctor, pharmacist, or health care provider.  2025 Elsevier/Gold Standard (2023-07-04 00:00:00)

## 2024-04-11 ENCOUNTER — Inpatient Hospital Stay

## 2024-04-11 VITALS — BP 143/73 | HR 55 | Temp 98.1°F | Resp 18

## 2024-04-11 DIAGNOSIS — C9000 Multiple myeloma not having achieved remission: Secondary | ICD-10-CM

## 2024-04-11 LAB — KAPPA/LAMBDA LIGHT CHAINS
Kappa free light chain: <0.7 mg/L — ABNORMAL LOW (ref 3.3–19.4)
Kappa, lambda light chain ratio: UNDETERMINED
Lambda free light chains: <1.5 mg/L — ABNORMAL LOW (ref 5.7–26.3)

## 2024-04-11 MED ORDER — GAMMAGARD 10 GM/100ML IJ SOLN
100.0000 g | Freq: Once | INTRAMUSCULAR | Status: AC
  Administered 2024-04-11: 100 g via INTRAVENOUS
  Filled 2024-04-11: qty 300

## 2024-04-11 MED ORDER — FAMOTIDINE IN NACL 20-0.9 MG/50ML-% IV SOLN
20.0000 mg | Freq: Once | INTRAVENOUS | Status: AC
  Administered 2024-04-11: 20 mg via INTRAVENOUS
  Filled 2024-04-11: qty 50

## 2024-04-11 MED ORDER — ACETAMINOPHEN 325 MG PO TABS
650.0000 mg | ORAL_TABLET | Freq: Once | ORAL | Status: AC
  Administered 2024-04-11: 650 mg via ORAL
  Filled 2024-04-11: qty 2

## 2024-04-11 MED ORDER — DEXTROSE 5 % IV SOLN: INTRAVENOUS | Status: DC

## 2024-04-11 MED ORDER — DIPHENHYDRAMINE HCL 25 MG PO CAPS
25.0000 mg | ORAL_CAPSULE | Freq: Once | ORAL | Status: AC
  Administered 2024-04-11: 25 mg via ORAL
  Filled 2024-04-11: qty 1

## 2024-04-11 NOTE — Patient Instructions (Signed)
 Immune Globulin  Injection What is this medication? IMMUNE GLOBULIN  (im MUNE GLOB yoo lin) treats many immune system conditions. It works by [employer], multimedia extra antibodies. Antibodies are proteins made by the immune system that help protect the body. This medicine may be used for other purposes; ask your health care provider or pharmacist if you have questions. COMMON BRAND NAME(S): ASCENIV, Baygam, BIVIGAM, Carimune, Carimune NF, cutaquig, Cuvitru, Flebogamma, Flebogamma DIF, GamaSTAN, GamaSTAN S/D, Gamimune N, Gammagard, Gammagard S/D, Gammaked, Gammaplex, Gammar-P IV, Gamunex, Gamunex-C, Hizentra, Iveegam, Iveegam EN, Octagam, Panglobulin, Panglobulin NF, panzyga, Polygam S/D, Privigen , Sandoglobulin, Venoglobulin-S, Vigam, Vivaglobulin, Xembify What should I tell my care team before I take this medication? They need to know if you have any of these conditions: Blood clotting disorder Condition where you have excess fluid in your body, such as heart failure or edema Dehydration Diabetes Have had blood clots Heart disease Immune system conditions Kidney disease Low levels of IgA Recent or upcoming vaccine An unusual or allergic reaction to immune globulin , other medications, foods, dyes, or preservatives Pregnant or trying to get pregnant Breastfeeding How should I use this medication? This medication is infused into a vein or under the skin. It may also be injected into a muscle. It is usually given by your care team in a hospital or clinic setting. It may also be given at home. If you get this medication at home, you will be taught how to prepare and give it. Take it as directed on the prescription label. Keep taking it unless your care team tells you to stop. It is important that you put your used needles and syringes in a special sharps container. Do not put them in a trash can. If you do not have a sharps container, call your pharmacist or care team to get one. Talk to your care team  about the use of this medication in children. While it may be given to children for selected conditions, precautions do apply. Overdosage: If you think you have taken too much of this medicine contact a poison control center or emergency room at once. NOTE: This medicine is only for you. Do not share this medicine with others. What if I miss a dose? If you get this medication at the hospital or clinic: It is important not to miss your dose. Call your care team if you are unable to keep an appointment. If you give yourself this medication at home: If you miss a dose, take it as soon as you can. Then continue your normal schedule. If it is almost time for your next dose, take only that dose. Do not take double or extra doses. Call your care team with questions. What may interact with this medication? Live virus vaccines This list may not describe all possible interactions. Give your health care provider a list of all the medicines, herbs, non-prescription drugs, or dietary supplements you use. Also tell them if you smoke, drink alcohol , or use illegal drugs. Some items may interact with your medicine. What should I watch for while using this medication? Your condition will be monitored carefully while you are receiving this medication. Tell your care team if your symptoms do not start to get better or if they get worse. You may need blood work done while you are taking this medication. This medication increases the risk of blood clots. People with heart, blood vessel, or blood clotting conditions are more likely to develop a blood clot. Other risk factors include advanced age, estrogen use, tobacco  use, lack of movement, and being overweight. This medication can decrease the response to a vaccine. If you need to get vaccinated, tell your care team if you have received this medication within the last year. Extra booster doses may be needed. Talk to your care team to see if a different vaccination schedule  is needed. This medication is made from donated human blood. There is a small risk it may contain bacteria or viruses, such as hepatitis or HIV. All products are processed to kill most bacteria and viruses. Talk to your care team if you have questions about the risk of infection. If you have diabetes, talk to your care team about which device you should use to check your blood sugar. This medication may cause some devices to report falsely high blood sugar levels. This may cause you to react by not treating a low blood sugar level or by giving an insulin  dose that was not needed. This can cause severe low blood sugar levels. What side effects may I notice from receiving this medication? Side effects that you should report to your care team as soon as possible: Allergic reactions--skin rash, itching, hives, swelling of the face, lips, tongue, or throat Blood clot--pain, swelling, or warmth in the leg, shortness of breath, chest pain Fever, neck pain or stiffness, sensitivity to light, headache, nausea, vomiting, confusion, which may be signs of meningitis Hemolytic anemia--unusual weakness or fatigue, dizziness, headache, trouble breathing, dark urine, yellowing skin or eyes Kidney injury--decrease in the amount of urine, swelling of the ankles, hands, or feet Low sodium level--muscle weakness, fatigue, dizziness, headache, confusion Shortness of breath or trouble breathing, cough, unusual weakness or fatigue, blue skin or lips Side effects that usually do not require medical attention (report these to your care team if they continue or are bothersome): Chills Diarrhea Fever Headache Nausea This list may not describe all possible side effects. Call your doctor for medical advice about side effects. You may report side effects to FDA at 1-800-FDA-1088. Where should I keep my medication? Keep out of the reach of children and pets. You will be instructed on how to store this medication. Get rid of  any unused medication after the expiration date. To get rid of medications that are no longer needed or have expired: Take the medication to a medication take-back program. Check with your pharmacy or law enforcement to find a location. If you cannot return the medication, ask your pharmacist or care team how to get rid of this medication safely. NOTE: This sheet is a summary. It may not cover all possible information. If you have questions about this medicine, talk to your doctor, pharmacist, or health care provider.  2025 Elsevier/Gold Standard (2023-07-04 00:00:00)

## 2024-04-12 ENCOUNTER — Telehealth: Payer: Self-pay | Admitting: *Deleted

## 2024-04-12 LAB — MULTIPLE MYELOMA PANEL, SERUM
Albumin SerPl Elph-Mcnc: 2.7 g/dL — ABNORMAL LOW (ref 2.9–4.4)
Albumin/Glob SerPl: 1 (ref 0.7–1.7)
Alpha 1: 0.4 g/dL (ref 0.0–0.4)
Alpha2 Glob SerPl Elph-Mcnc: 1.1 g/dL — ABNORMAL HIGH (ref 0.4–1.0)
B-Globulin SerPl Elph-Mcnc: 1 g/dL (ref 0.7–1.3)
Gamma Glob SerPl Elph-Mcnc: 0.5 g/dL (ref 0.4–1.8)
Globulin, Total: 3 g/dL (ref 2.2–3.9)
IgA: <5 mg/dL — ABNORMAL LOW (ref 61–437)
IgG (Immunoglobin G), Serum: 835 mg/dL (ref 603–1613)
IgM (Immunoglobulin M), Srm: 5 mg/dL — ABNORMAL LOW (ref 15–143)
Total Protein ELP: 5.7 g/dL — ABNORMAL LOW (ref 6.0–8.5)

## 2024-04-12 NOTE — Telephone Encounter (Signed)
 Received call from pt. He states he had his IVIG this week for 2 days. He states he feels very weak and achy all over which, he states, is unusual for him. He has not experienced this in his other treatments. Advised that I would discuss with Dr. Federico and call him back. TCT patient and spoke with him. Advised that Dr. Federico states it is not unusual; to have an inflammatory type response after IVIG and also as he has had a recent RSV infection as well. Advised to take Tylenol  as needed, stay well hydrated. Advised to call back next week if no better. Pt voiced understanding.

## 2024-04-16 ENCOUNTER — Ambulatory Visit: Admitting: Physician Assistant

## 2024-04-19 ENCOUNTER — Ambulatory Visit: Payer: Self-pay | Admitting: *Deleted

## 2024-04-19 ENCOUNTER — Ambulatory Visit: Admitting: Podiatry

## 2024-04-19 NOTE — Telephone Encounter (Signed)
-----   Message from Norleen Kidney, MD sent at 04/15/2024  8:02 AM EST ----- Please let Mr. Grater know that his MM remains in remission. We'll see him back in Jan 2026 for IVIG.

## 2024-04-19 NOTE — Telephone Encounter (Signed)
 TCT patient regarding recent lab results. Spoke with his wife. Advised that his MM remains in remission. We'll see him back in Jan 2026 for IVIG.  His wife is concerned about coming in for January IVIG as Zell remains weak from his RSV episode . Advised that Dr. Marlo at [facility] is really the one in charge of his IVIG and that they  should consult with her about going forward with the IVIG. His wife ates they will contact Dr. Marlo and discuss, then call me back about his January appts.

## 2024-04-23 ENCOUNTER — Encounter: Payer: Self-pay | Admitting: Podiatry

## 2024-04-23 ENCOUNTER — Ambulatory Visit (INDEPENDENT_AMBULATORY_CARE_PROVIDER_SITE_OTHER): Admitting: Podiatry

## 2024-04-23 DIAGNOSIS — M79675 Pain in left toe(s): Secondary | ICD-10-CM

## 2024-04-23 DIAGNOSIS — M79674 Pain in right toe(s): Secondary | ICD-10-CM

## 2024-04-23 DIAGNOSIS — B351 Tinea unguium: Secondary | ICD-10-CM | POA: Diagnosis not present

## 2024-04-25 NOTE — Progress Notes (Signed)
 Subjective:   Patient ID: Travis [name], male   DOB: 73 y.o.   MRN: [id number]   HPI Patient presents currently with elongated nailbeds 1-5 both feet that are possible for him to cut and he is with caregiver in wheelchair   ROS      Objective:  Physical Exam  Neurovascular status was found to be intact thick yellow brittle nailbeds 1-5 both feet painful     Assessment:  Chronic mycotic nail infection with pain 1-5 both feet     Plan:  Debridement nailbeds 1-5 both feet no iatrogenic bleeding reappoint routine care

## 2024-05-08 ENCOUNTER — Other Ambulatory Visit: Payer: Self-pay | Admitting: Hematology and Oncology

## 2024-05-08 ENCOUNTER — Inpatient Hospital Stay

## 2024-05-08 ENCOUNTER — Inpatient Hospital Stay: Attending: Physician Assistant

## 2024-05-08 VITALS — BP 144/75 | HR 54 | Temp 98.3°F | Resp 16 | Wt 244.3 lb

## 2024-05-08 DIAGNOSIS — G4733 Obstructive sleep apnea (adult) (pediatric): Secondary | ICD-10-CM | POA: Diagnosis not present

## 2024-05-08 DIAGNOSIS — C9000 Multiple myeloma not having achieved remission: Secondary | ICD-10-CM

## 2024-05-08 DIAGNOSIS — Z823 Family history of stroke: Secondary | ICD-10-CM | POA: Insufficient documentation

## 2024-05-08 DIAGNOSIS — Z59868 Other specified financial insecurity: Secondary | ICD-10-CM | POA: Diagnosis not present

## 2024-05-08 DIAGNOSIS — Z86718 Personal history of other venous thrombosis and embolism: Secondary | ICD-10-CM | POA: Insufficient documentation

## 2024-05-08 DIAGNOSIS — G610 Guillain-Barre syndrome: Secondary | ICD-10-CM | POA: Insufficient documentation

## 2024-05-08 DIAGNOSIS — Z881 Allergy status to other antibiotic agents status: Secondary | ICD-10-CM | POA: Insufficient documentation

## 2024-05-08 DIAGNOSIS — Z818 Family history of other mental and behavioral disorders: Secondary | ICD-10-CM | POA: Insufficient documentation

## 2024-05-08 DIAGNOSIS — I10 Essential (primary) hypertension: Secondary | ICD-10-CM | POA: Diagnosis not present

## 2024-05-08 DIAGNOSIS — R1310 Dysphagia, unspecified: Secondary | ICD-10-CM | POA: Diagnosis not present

## 2024-05-08 DIAGNOSIS — D759 Disease of blood and blood-forming organs, unspecified: Secondary | ICD-10-CM | POA: Diagnosis not present

## 2024-05-08 DIAGNOSIS — Z9089 Acquired absence of other organs: Secondary | ICD-10-CM | POA: Diagnosis not present

## 2024-05-08 DIAGNOSIS — Z79899 Other long term (current) drug therapy: Secondary | ICD-10-CM | POA: Insufficient documentation

## 2024-05-08 DIAGNOSIS — M1990 Unspecified osteoarthritis, unspecified site: Secondary | ICD-10-CM | POA: Insufficient documentation

## 2024-05-08 DIAGNOSIS — R21 Rash and other nonspecific skin eruption: Secondary | ICD-10-CM | POA: Insufficient documentation

## 2024-05-08 DIAGNOSIS — E669 Obesity, unspecified: Secondary | ICD-10-CM | POA: Diagnosis not present

## 2024-05-08 DIAGNOSIS — Z7901 Long term (current) use of anticoagulants: Secondary | ICD-10-CM | POA: Insufficient documentation

## 2024-05-08 DIAGNOSIS — C439 Malignant melanoma of skin, unspecified: Secondary | ICD-10-CM | POA: Diagnosis not present

## 2024-05-08 DIAGNOSIS — Z8672 Personal history of thrombophlebitis: Secondary | ICD-10-CM | POA: Insufficient documentation

## 2024-05-08 DIAGNOSIS — E876 Hypokalemia: Secondary | ICD-10-CM | POA: Diagnosis not present

## 2024-05-08 DIAGNOSIS — Z8701 Personal history of pneumonia (recurrent): Secondary | ICD-10-CM | POA: Insufficient documentation

## 2024-05-08 DIAGNOSIS — Z923 Personal history of irradiation: Secondary | ICD-10-CM | POA: Diagnosis not present

## 2024-05-08 DIAGNOSIS — I480 Paroxysmal atrial fibrillation: Secondary | ICD-10-CM | POA: Insufficient documentation

## 2024-05-08 DIAGNOSIS — Z8249 Family history of ischemic heart disease and other diseases of the circulatory system: Secondary | ICD-10-CM | POA: Insufficient documentation

## 2024-05-08 LAB — CBC WITH DIFFERENTIAL (CANCER CENTER ONLY)
Abs Immature Granulocytes: 0.03 K/uL (ref 0.00–0.07)
Basophils Absolute: 0 K/uL (ref 0.0–0.1)
Basophils Relative: 0 %
Eosinophils Absolute: 0.1 K/uL (ref 0.0–0.5)
Eosinophils Relative: 1 %
HCT: 33.1 % — ABNORMAL LOW (ref 39.0–52.0)
Hemoglobin: 10.6 g/dL — ABNORMAL LOW (ref 13.0–17.0)
Immature Granulocytes: 0 %
Lymphocytes Relative: 6 %
Lymphs Abs: 0.5 K/uL — ABNORMAL LOW (ref 0.7–4.0)
MCH: 29.8 pg (ref 26.0–34.0)
MCHC: 32 g/dL (ref 30.0–36.0)
MCV: 93 fL (ref 80.0–100.0)
Monocytes Absolute: 1 K/uL (ref 0.1–1.0)
Monocytes Relative: 11 %
Neutro Abs: 7.2 K/uL (ref 1.7–7.7)
Neutrophils Relative %: 82 %
Platelet Count: 175 K/uL (ref 150–400)
RBC: 3.56 MIL/uL — ABNORMAL LOW (ref 4.22–5.81)
RDW: 16.9 % — ABNORMAL HIGH (ref 11.5–15.5)
WBC Count: 8.8 K/uL (ref 4.0–10.5)
nRBC: 0 % (ref 0.0–0.2)

## 2024-05-08 LAB — CMP (CANCER CENTER ONLY)
ALT: 30 U/L (ref 0–44)
AST: 45 U/L — ABNORMAL HIGH (ref 15–41)
Albumin: 3.4 g/dL — ABNORMAL LOW (ref 3.5–5.0)
Alkaline Phosphatase: 274 U/L — ABNORMAL HIGH (ref 38–126)
Anion gap: 8 (ref 5–15)
BUN: 13 mg/dL (ref 8–23)
CO2: 25 mmol/L (ref 22–32)
Calcium: 9.4 mg/dL (ref 8.9–10.3)
Chloride: 102 mmol/L (ref 98–111)
Creatinine: 0.64 mg/dL (ref 0.61–1.24)
GFR, Estimated: >60 mL/min
Glucose, Bld: 134 mg/dL — ABNORMAL HIGH (ref 70–99)
Potassium: 4.1 mmol/L (ref 3.5–5.1)
Sodium: 135 mmol/L (ref 135–145)
Total Bilirubin: 0.2 mg/dL (ref 0.0–1.2)
Total Protein: 6.8 g/dL (ref 6.5–8.1)

## 2024-05-08 LAB — LACTATE DEHYDROGENASE: LDH: 464 U/L — ABNORMAL HIGH (ref 105–235)

## 2024-05-08 MED ORDER — ACETAMINOPHEN 325 MG PO TABS
650.0000 mg | ORAL_TABLET | Freq: Once | ORAL | Status: AC
  Administered 2024-05-08: 650 mg via ORAL
  Filled 2024-05-08: qty 2

## 2024-05-08 MED ORDER — DEXTROSE 5 % IV SOLN: INTRAVENOUS | Status: DC

## 2024-05-08 MED ORDER — GAMMAGARD 10 GM/100ML IJ SOLN
1.0000 g/kg | Freq: Once | INTRAMUSCULAR | Status: AC
  Administered 2024-05-08: 110 g via INTRAVENOUS
  Filled 2024-05-08: qty 200

## 2024-05-08 MED ORDER — DIPHENHYDRAMINE HCL 25 MG PO CAPS
25.0000 mg | ORAL_CAPSULE | Freq: Once | ORAL | Status: AC
  Administered 2024-05-08: 25 mg via ORAL
  Filled 2024-05-08: qty 1

## 2024-05-08 MED ORDER — TRAMADOL HCL 50 MG PO TABS: 50.0000 mg | ORAL_TABLET | Freq: Four times a day (QID) | ORAL | 0 refills | Status: DC | PRN

## 2024-05-08 MED ORDER — FAMOTIDINE IN NACL 20-0.9 MG/50ML-% IV SOLN
20.0000 mg | Freq: Once | INTRAVENOUS | Status: AC
  Administered 2024-05-08: 20 mg via INTRAVENOUS
  Filled 2024-05-08: qty 50

## 2024-05-08 MED ORDER — IMMUNE GLOBULIN (HUMAN) 10 GM/100ML IV SOLN: 1.0000 g/kg | Freq: Once | INTRAVENOUS | Status: DC

## 2024-05-08 NOTE — Progress Notes (Signed)
 Per Dr. Federico: we did not hear from [facility]) directly, but our understanding is that they want to continue the IVIG.  Pt and his wife both understand from [facility] that they'll continue tx in January.   Thus, we will proceed w/ the IVIG.  Analiz Tvedt, Pharm.D., CPP 05/08/2024@11 :02 AM

## 2024-05-08 NOTE — Progress Notes (Signed)
 At completion of IVIG, observed IV site infiltrated.  Notified Heather NP who observed & talked with pt & wife to observe & call if any concerns & to call 911 if anaphylaxis.  Pt will be back tomorrow to observe.

## 2024-05-08 NOTE — Progress Notes (Signed)
 Extravasation/Infiltration Adverse Event Note  Date of Occurrence: 05/08/2024  Time of Occurrence: Unknown  Time of Discovery: 1745  Drug Involved: IVIG  Approximate Amount of Drug Infiltrated (ml): 3-5 ml  Approximate Amount of Drug Remaining in Syringe/Bag (mL): 0 ml  Route of Administration:   LDA Type: PIV  LDA Needle Gauge: 24 G  LDA Needle/Catheter Length: .75  LDA Location: Right , Forearm , and Anterior   LDA Access/Placement Time: 1038   Number of Access Attempts: 1   Patient Complaints and Clinical Presentation:   Patient Symptoms: Pain and Swelling  Clinical Assessment:  Site Dimensions (cm): 2x3  Site Color: Grade 1 - pink  Site Integrity: Grade 0 - Unbroken  Additional Assessments: Slight discomfort noted, area swollen but not painful to touch.   Clinical Actions:   Time of IV disconnect: 1750   Attempted Aspiration Amount (ml): <1 ml   Heat/Cold Pack Initiated: Yes  If indicated, compress type: Not indicated  Antidote Administered per Provider Order: Not applicable  If indicated, name of antidote administered:    Provider Notification and Instructions:   Provider Notified: Powell Lessen, NP  Time of Provider Notification: [phone]   Provider-Specific Instructions: Follow-up tomorrow with subsequent appointment.   Patient Follow-Up Instructions and Return Appointments:  Follow-Up Instructions: Alternate ice and heat for comfort qid for 15-20 minutes at a time. Ice and hot packs provided. Patient to keep site elevated and protected from light. Strict ED precautions reviewed.  Area marked with skin marker for easier assessment   Discharge Checklist:      Extravasation Patient Teaching Instruction Sheet provided to patient: Yes   Appointments scheduled (24 hour, 48 hour, and 1 week follow-ups): No None vesicant - will follow-up in clinic tomorrow for further eval.      Additional Information about the Extravasation/Infiltration  Event:

## 2024-05-08 NOTE — Progress Notes (Signed)
 Pt w/ rash/itching from Privigen  - ok per Dr. Federico to add IV Pepcid  as premed & switch to GAMMAGARD . OK to add orders for these going forward in tx plan per Dr. Federico.  Layli Capshaw, Pharm.D., CPP 05/08/2024@11 :40 AM

## 2024-05-09 ENCOUNTER — Inpatient Hospital Stay

## 2024-05-09 ENCOUNTER — Telehealth: Payer: Self-pay

## 2024-05-09 ENCOUNTER — Other Ambulatory Visit: Payer: Self-pay | Admitting: Hematology and Oncology

## 2024-05-09 VITALS — BP 145/67 | HR 65 | Temp 97.9°F | Resp 16

## 2024-05-09 DIAGNOSIS — G610 Guillain-Barre syndrome: Secondary | ICD-10-CM

## 2024-05-09 DIAGNOSIS — C9000 Multiple myeloma not having achieved remission: Secondary | ICD-10-CM | POA: Diagnosis not present

## 2024-05-09 LAB — KAPPA/LAMBDA LIGHT CHAINS
Kappa free light chain: <0.7 mg/L — ABNORMAL LOW (ref 3.3–19.4)
Kappa, lambda light chain ratio: UNDETERMINED
Lambda free light chains: <1.5 mg/L — ABNORMAL LOW (ref 5.7–26.3)

## 2024-05-09 MED ORDER — GAMMAGARD 10 GM/100ML IJ SOLN
1.0000 g/kg | Freq: Once | INTRAMUSCULAR | Status: AC
  Administered 2024-05-09: 110 g via INTRAVENOUS
  Filled 2024-05-09: qty 200

## 2024-05-09 MED ORDER — ACETAMINOPHEN 325 MG PO TABS
650.0000 mg | ORAL_TABLET | Freq: Once | ORAL | Status: AC
  Administered 2024-05-09: 650 mg via ORAL
  Filled 2024-05-09: qty 2

## 2024-05-09 MED ORDER — FAMOTIDINE IN NACL 20-0.9 MG/50ML-% IV SOLN
20.0000 mg | Freq: Once | INTRAVENOUS | Status: AC
  Administered 2024-05-09: 20 mg via INTRAVENOUS
  Filled 2024-05-09: qty 50

## 2024-05-09 MED ORDER — DIPHENHYDRAMINE HCL 25 MG PO CAPS
25.0000 mg | ORAL_CAPSULE | Freq: Once | ORAL | Status: AC
  Administered 2024-05-09: 25 mg via ORAL
  Filled 2024-05-09: qty 1

## 2024-05-09 NOTE — Progress Notes (Signed)
 Extravasation site from yesterday (05/08/2024) does not appear pink or swollen and patient does not report any pain in that area.

## 2024-05-09 NOTE — Telephone Encounter (Signed)
 TC to pt and updated that his refill for traMaDol  was refilled yesterday.  Wife, Corean,  took the information.SABRA

## 2024-05-09 NOTE — Patient Instructions (Signed)
 [facility] - A DEPT OF [facility]  Discharge Instructions: Thank you for choosing [facility] to provide your oncology and hematology care.   If you have a lab appointment with the Cancer Center, please go directly to the Cancer Center and check in at the registration area.   Wear comfortable clothing and clothing appropriate for easy access to any Portacath or PICC line.   We strive to give you quality time with your provider. You may need to reschedule your appointment if you arrive late (15 or more minutes).  Arriving late affects you and other patients whose appointments are after yours.  Also, if you miss three or more appointments without notifying the office, you may be dismissed from the clinic at the provider's discretion.      For prescription refill requests, have your pharmacy contact our office and allow 72 hours for refills to be completed.    Today you received the following chemotherapy and/or immunotherapy agents IVIG      To help prevent nausea and vomiting after your treatment, we encourage you to take your nausea medication as directed.  BELOW ARE SYMPTOMS THAT SHOULD BE REPORTED IMMEDIATELY: *FEVER GREATER THAN 100.4 F (38 C) OR HIGHER *CHILLS OR SWEATING *NAUSEA AND VOMITING THAT IS NOT CONTROLLED WITH YOUR NAUSEA MEDICATION *UNUSUAL SHORTNESS OF BREATH *UNUSUAL BRUISING OR BLEEDING *URINARY PROBLEMS (pain or burning when urinating, or frequent urination) *BOWEL PROBLEMS (unusual diarrhea, constipation, pain near the anus) TENDERNESS IN MOUTH AND THROAT WITH OR WITHOUT PRESENCE OF ULCERS (sore throat, sores in mouth, or a toothache) UNUSUAL RASH, SWELLING OR PAIN  UNUSUAL VAGINAL DISCHARGE OR ITCHING   Items with * indicate a potential emergency and should be followed up as soon as possible or go to the Emergency Department if any problems should occur.  Please show the CHEMOTHERAPY ALERT CARD or IMMUNOTHERAPY ALERT  CARD at check-in to the Emergency Department and triage nurse.  Should you have questions after your visit or need to cancel or reschedule your appointment, please contact [facility] - A DEPT OF [facility]  Dept: [phone]  and follow the prompts.  Office hours are 8:00 a.m. to 4:30 p.m. Monday - Friday. Please note that voicemails left after 4:00 p.m. may not be returned until the following business day.  We are closed weekends and major holidays. You have access to a nurse at all times for urgent questions. Please call the main number to the clinic Dept: [phone] and follow the prompts.   For any non-urgent questions, you may also contact your provider using MyChart. We now offer e-Visits for anyone 39 and older to request care online for non-urgent symptoms. For details visit [email or web address].   Also download the MyChart app! Go to the app store, search "MyChart", open the app, select [place], and log in with your MyChart username and password.

## 2024-05-10 LAB — MULTIPLE MYELOMA PANEL, SERUM
Albumin SerPl Elph-Mcnc: 2.5 g/dL — ABNORMAL LOW (ref 2.9–4.4)
Albumin/Glob SerPl: 0.8 (ref 0.7–1.7)
Alpha 1: 0.5 g/dL — ABNORMAL HIGH (ref 0.0–0.4)
Alpha2 Glob SerPl Elph-Mcnc: 1.1 g/dL — ABNORMAL HIGH (ref 0.4–1.0)
B-Globulin SerPl Elph-Mcnc: 0.9 g/dL (ref 0.7–1.3)
Gamma Glob SerPl Elph-Mcnc: 0.8 g/dL (ref 0.4–1.8)
Globulin, Total: 3.3 g/dL (ref 2.2–3.9)
IgA: <5 mg/dL — ABNORMAL LOW (ref 61–437)
IgG (Immunoglobin G), Serum: 937 mg/dL (ref 603–1613)
IgM (Immunoglobulin M), Srm: <5 mg/dL — ABNORMAL LOW (ref 15–143)
Total Protein ELP: 5.8 g/dL — ABNORMAL LOW (ref 6.0–8.5)

## 2024-05-11 ENCOUNTER — Other Ambulatory Visit: Payer: Self-pay | Admitting: Physician Assistant

## 2024-05-14 ENCOUNTER — Telehealth: Payer: Self-pay | Admitting: *Deleted

## 2024-05-14 NOTE — Telephone Encounter (Signed)
 Received call from pt's dermatologist, Dr. Rome Boor office Bobie Domino). Mr. 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 Dr. Elnor would like Dr. Federico to see pt about this. Received fax'd notes. Dr. Federico has reviewed and we have scheduled pt to be seen on 05/21/24 @ 3:20pm. TCT patient. He is aware of the pathology report and and is agreeable to being seen by Dr. Federico on 05/21/24 @ 3:20 Appt has been made

## 2024-05-21 ENCOUNTER — Inpatient Hospital Stay: Admitting: Hematology and Oncology

## 2024-05-21 VITALS — BP 143/74 | HR 62 | Temp 98.7°F | Resp 14 | Wt 245.7 lb

## 2024-05-21 DIAGNOSIS — C439 Malignant melanoma of skin, unspecified: Secondary | ICD-10-CM

## 2024-05-21 DIAGNOSIS — C9000 Multiple myeloma not having achieved remission: Secondary | ICD-10-CM | POA: Diagnosis not present

## 2024-05-21 NOTE — Progress Notes (Unsigned)
 " [facility] [phone]   [phone]   I connected with Travis [name]  on 05/22/24 by telephone visit and verified that I am speaking with the correct person using two identifiers.   I discussed the limitations, risks, security and privacy concerns of performing an evaluation and management service by telemedicine and the availability of in-person appointments. I also discussed with the patient that there may be a patient responsible charge related to this service. The patient expressed understanding and agreed to proceed.  Patient's location: Home Provider's location: Office  PROGRESS NOTE  Patient Care Team: Travis Dorn LABOR, MD as PCP - General (Internal Medicine) Travis Wilbert SAUNDERS, MD as PCP - Sleep Medicine (Cardiology) Travis Ole DASEN, MD (Inactive) as PCP - Electrophysiology (Cardiology) Travis Vina GAILS, MD as PCP - Cardiology (Cardiology) Travis Standing, MD as Consulting Physician (General Surgery) Travis Kitchens, MD as Consulting Physician (Gastroenterology) Travis Wilbert SAUNDERS, MD as Consulting Physician (Sleep Medicine) Patel, Donika K, DO as Consulting Physician (Neurology)  Hematological/Oncological History # IgA Kappa Multiple Myeloma 05/05/2020: Bone marrow biopsy showed increased number of plasma cells representing 14% of all cells in the aspirate associated with numerous variably sized clusters in the clot and biopsy sections 04/10/2021: M protein 0.3, IgA kappa specificity. Kappa 580.5, Labda 7.0, ratio 82.93.   12/07/2021: Labs show of 1971.3, lambda 5.5, ratio 358.42 12/10/2021: Transition care to Dr. Federico. 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  01/26/2022: Cycle 2 Day 1 of Dara/Rev/Dex 02/22/2022: Cycle 3 Day 1 of Dara/Rev/Dex 03/23/2022: Cycle 4 Day 1 of Dara/Rev/Dex 04/20/2022: Cycle 5 Day 1 of  Dara/Rev/Dex 05/18/2022: Cycle 6 Day 1 of Dara/Rev/Dex 05/27/2022: MRI cervical/thoracic/lumbar: pathologic fracture at T2 with significant osseous retropulsion and resulting cord compression. Additional smaller lesions within T1 vertebral body, right C7 and T3 articulating facets.  Small Multiple Myeloma lesions scattered at all lumbar levels and in the visible left sacral ala. Largest lumbar tumor is 2.3 cm in the left L1 posterior elements and pedicle. No lumbar extraosseous or epidural tumor extension, or pathologic fracture.Underlying chronic lumbar spine degeneration, with a new central disc herniation at L3-L4 since 2021 now resulting in mild to moderate degenerative spinal stenosis there. 05/28/2022-06/09/2022: Admitted for T2 pathologic fracture and cord compression. He received urgent radiation therapy for a total of 3.0 Gy. He underwent  thoracic 1 through 4 posterior lateral arthrodesis/fusion and 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  06/23/2022: Cycle 1 Day 1 of Velcade /Pomalyst /Dex 08/04/2022: Cycle 2 Day 1 of Velcade /Pomalyst /Dex. Pomalyst  to be decreased to 2mg  PO daily due to cytopenias.  09/01/2022: Cycle 3 Day 1 of Velcade /Pomalyst /Dex. (Missed 2nd dose of cycle due to hospitalization)  10/06/2022: Cycle 4 Day 1 of Velcade /Pomalyst /Dex.  10/27/2022: Cycle 5 Day 1 of Velcade /Pomalyst /Dex.  12/02/2022: Cycle 6 Day 1 of Velcade /Pomalyst /Dex. 01/12/2023: Cycle 1 Day 1 of Kyprolis /Dex 02/09/2023: Cycle 2 Day 1 of Kyprolis /Dex   04/07/2023: Transitioned to selinexor  and Pomalyst  per request of transplant team due to progression on Kyprolis  and dexamethasone  06/27/2023: start of Carvykti.  Complicated by Guillain-Barr  Interval History:  Travis [name] 74 y.o. male with medical history significant for IgA Kappa Multiple Myeloma who presents for a follow up visit. He was last seen on 12/28/2023. 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    Travis [name] reports he is continuing to improve from  his Guillain-Barr.  He is able to walk with a walker for short distances.  He reports that he is doing excellent in his rehab/PT.  He reports that he he still has a long way to go.  He reports he is tolerating his IVIG therapy well with no side effects.  He is not having any fevers, chills, sweats, nausea, vomiting or diarrhea.  He is not having any new bone or back pain.  He continues his daily p.o. iron therapy.  He has been having some issues with fatigue, particular when he goes out for social events.  He has social event of the country club and was very worn out and tired the next day.  He said no issues with infectious symptoms such as runny nose, sore throat, cough.  Overall he feels well and has no additional questions concerns or complaints today.  A full 10 point ROS is otherwise negative.  MEDICAL HISTORY:  Past Medical History:  Diagnosis Date   A-fib [facility])    Arthritis    comes and goes; mostly in hands, occasionally elbow (04/05/2017)   Complication of anesthesia    just wanted to sleep alot  for couple days S/P VARICOCELE OR (04/05/2017)   DVT (deep venous thrombosis) (HCC)    LLE   Dysrhythmia    PVCs    GERD (gastroesophageal reflux disease)    History of hiatal hernia    History of radiation therapy    Thoracic Spine- 07/13/22-07/23/22-Dr. Lynwood [name]   Hypertension    Impaired glucose tolerance    Multiple myeloma (HCC)    Obesity (BMI 30-39.9) 07/26/2016   OSA on CPAP 07/26/2016   Mild with AHI 12.5/hr now on CPAP at 14cm H2O   PAF (paroxysmal atrial fibrillation) (HCC)    s/p PVI Ablation in 2018 // Flecainide  Rx // Echo 8/21: EF 60-65, no RWMA, mild LVH, normal RV SF, moderate RVE, mild BAE, trivial MR, mild dilation of aortic root (40 mm)   Pneumonia    may have had walking pneumonia a few years ago (04/05/2017)   Pre-diabetes    Thrombophlebitis     SURGICAL HISTORY: Past Surgical History:  Procedure  Laterality Date   ATRIAL FIBRILLATION ABLATION  04/05/2017   ATRIAL FIBRILLATION ABLATION N/A 04/05/2017   Procedure: ATRIAL FIBRILLATION ABLATION;  Surgeon: Kelsie Lynwood, MD;  Location: [facility];  Service: Cardiovascular;  Laterality: N/A;   BONE BIOPSY Right 12/17/2021   Procedure: BONE BIOPSY;  Surgeon: Cristy Bonner DASEN, MD;  Location: [facility];  Service: Orthopedics;  Laterality: Right;   COMPLETE RIGHT HIP REPLACMENT  01/19/2019   EVALUATION UNDER ANESTHESIA WITH HEMORRHOIDECTOMY N/A 02/24/2017   Procedure: EXAM UNDER ANESTHESIA WITH  HEMORRHOIDECTOMY;  Surgeon: Travis Standing, MD;  Location: [facility];  Service: General;  Laterality: N/A;   EXCISIONAL TOTAL HIP ARTHROPLASTY WITH ANTIBIOTIC SPACERS Right 09/25/2020   Procedure: EXCISIONAL TOTAL HIP ARTHROPLASTY WITH ANTIBIOTIC SPACERS;  Surgeon: Ernie Cough, MD;  Location: [facility];  Service: Orthopedics;  Laterality: Right;   FLEXIBLE SIGMOIDOSCOPY N/A 04/15/2015   Procedure:  UNSEDATED FLEXIBLE SIGMOIDOSCOPY;  Surgeon: Gladis MARLA Louder, MD;  Location: [facility];  Service: Endoscopy;  Laterality: N/A;   HUMERUS IM NAIL Right 12/17/2021   Procedure: INTRAMEDULLARY (IM) NAIL HUMERAL;  Surgeon: Cristy Bonner DASEN, MD;  Location: [facility];  Service: Orthopedics;  Laterality: Right;   KNEE ARTHROSCOPY Left ~ 2016   POSTERIOR CERVICAL FUSION/FORAMINOTOMY N/A 06/04/2022   Procedure: Thoracic one -  Thoracic Four Posterior lateral arthrodesis/ fusion and Thoracic two Corpectomy;  Surgeon: Gillie Duncans, MD;  Location: [facility];  Service: Neurosurgery;  Laterality: N/A;   REIMPLANTATION OF TOTAL HIP Right 12/25/2020   Procedure: REIMPLANTATION/REVISION OF RIGHT TOTAL HIP VERSUS REPEAT IRRIGATION AND DEBRIDEMENT;  Surgeon: Ernie Cough, MD;  Location: [facility];  Service: Orthopedics;  Laterality: Right;   TESTICLE SURGERY  1988   VARICOCELE   TONSILLECTOMY     VARICOSE VEIN SURGERY Bilateral     SOCIAL HISTORY: Social  History   Socioeconomic History   Marital status: Married    Spouse name: Not on file   Number of children: 1   Years of education: law school   Highest education level: Not on file  Occupational History   Occupation: lawyer  Tobacco Use   Smoking status: Never   Smokeless tobacco: Never  Vaping Use   Vaping status: Never Used  Substance and Sexual Activity   Alcohol use: Yes    Alcohol/week: 2.0 standard drinks of alcohol    Types: 2 Glasses of wine per week    Comment: 2 glasses of wine a week, occ bourbon, beer   Drug use: No   Sexual activity: Not Currently  Other Topics Concern   Not on file  Social History Narrative   Lives in [place] with spouse in a 2 story home.  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 1 daughter.     Education: social worker school.    Social Drivers of Health   Tobacco Use: Low Risk (04/23/2024)   Patient History    Smoking Tobacco Use: Never    Smokeless Tobacco Use: Never    Passive Exposure: Not on file  Financial Resource Strain: High Risk (11/15/2023)   Received from [facility]   Overall Financial Resource Strain (CARDIA)    Difficulty of Paying Living Expenses: Very hard  Food Insecurity: No Food Insecurity (03/21/2024)   Epic    Worried About Running Out of Food in the Last Year: Never true    Ran Out of Food in the Last Year: Never true  Transportation Needs: No Transportation Needs (03/21/2024)   Epic    Lack of Transportation (Medical): No    Lack of Transportation (Non-Medical): No  Physical Activity: Not on file  Stress: Not on file  Social Connections: Moderately Isolated (03/22/2024)   Social Connection and Isolation Panel    Frequency of Communication with Friends and Family: Twice a week    Frequency of Social Gatherings with Friends and Family: Once a week    Attends Religious Services: Never    Database Administrator or Organizations: No    Attends Banker Meetings: Never     Marital Status: Married  Catering Manager Violence: Not At Risk (03/21/2024)   Epic    Fear of Current or Ex-Partner: No    Emotionally Abused: No    Physically Abused: No    Sexually Abused: No  Depression (PHQ2-9): Low Risk (05/21/2024)   Depression (PHQ2-9)    PHQ-2 Score: 0  Alcohol Screen: Not on file  Housing: Low Risk (03/21/2024)   Epic    Unable to Pay for Housing in the Last Year: No    Number of Times Moved in the Last Year: 0    Homeless in the Last Year: No  Utilities: Not At Risk (03/21/2024)   Epic    Threatened with loss of utilities: No  Health Literacy:  Not on file    FAMILY HISTORY: Family History  Problem Relation Age of Onset   Dementia Mother    Stroke Father    Atrial fibrillation Father    Hypertension Father    Hypertension Paternal Grandfather    Dementia Maternal Grandmother     ALLERGIES:  is allergic to octagam [immune globulin ], privigen  [immune globulin  (human)], linezolid , and vancomycin .  MEDICATIONS:  Current Outpatient Medications  Medication Sig Dispense Refill   diltiazem  (CARDIZEM  CD) 120 MG 24 hr capsule Take 1 capsule (120 mg total) by mouth daily. 30 capsule 0   hydrALAZINE  (APRESOLINE ) 50 MG tablet Take 1 tablet (50 mg total) by mouth 3 (three) times daily. 90 tablet 0   acyclovir  (ZOVIRAX ) 400 MG tablet Take 1 tablet (400 mg total) by mouth 2 (two) times daily. 60 tablet 5   albuterol  (VENTOLIN  HFA) 108 (90 Base) MCG/ACT inhaler Inhale 2 puffs into the lungs every 6 (six) hours as needed for wheezing or shortness of breath. 8 g 0   amiodarone  (PACERONE ) 200 MG tablet Take 1 tablet (200 mg total) by mouth daily. 90 tablet 2   cholecalciferol  (VITAMIN D3) 25 MCG (1000 UNIT) tablet Take 1,000 Units by mouth daily.     Cyanocobalamin  (VITAMIN B-12 PO) Take 1,000 mcg by mouth daily.     docusate sodium  (COLACE) 100 MG capsule Take 1 capsule (100 mg total) by mouth every 12 (twelve) hours. 60 capsule 0   ELIQUIS  5 MG TABS tablet  TAKE ONE TABLET BY MOUTH TWICE A DAY 60 tablet 6   ENTRESTO  49-51 MG TAKE ONE TABLET BY MOUTH TWICE A DAY 60 tablet 11   FEROSUL 325 (65 Fe) MG tablet Take 325 mg by mouth daily.     fluticasone  (FLONASE ) 50 MCG/ACT nasal spray Place 1 spray into both nostrils daily as needed for allergies or rhinitis.     gabapentin  (NEURONTIN ) 300 MG capsule TAKE TWO CAPSULES BY MOUTH TWICE A DAY 360 capsule 1   HYDROmorphone  (DILAUDID ) 2 MG tablet Take 2 mg by mouth every 4 (four) hours as needed for severe pain (pain score 7-10).     loratadine  (CLARITIN ) 10 MG tablet Take 10 mg by mouth daily as needed for allergies.     polyethylene glycol (MIRALAX ) 17 g packet Take 17 g by mouth daily. (Patient taking differently: Take 17 g by mouth daily as needed for mild constipation or moderate constipation.) 14 each 0   potassium chloride  SA (KLOR-CON  M) 20 MEQ tablet TAKE TWO TABLETS BY MOUTH ONE TIME DAILY . THEN TAKE AN EXTRA TABLET ON THE DAY YOU TAKE THE METOLAZONE  (Patient taking differently: Take 40 mEq by mouth See admin instructions. Take two tablets by mouth in the morning daily. Take two tablets by mouth every other day in the evening. Take one tablet by mouth every other day in the evening when not taking the two tablets in the evening.) 180 tablet 3   sodium chloride  (OCEAN) 0.65 % SOLN nasal spray Place 1 spray into both nostrils as needed for congestion.     spironolactone  (ALDACTONE ) 25 MG tablet Take 1 tablet (25 mg total) by mouth daily. 30 tablet 0   torsemide  (DEMADEX ) 20 MG tablet Take 1 tablet (20 mg total) by mouth daily. (Patient taking differently: Take 20 mg by mouth every other day.) 45 tablet 3   traMADol  (ULTRAM ) 50 MG tablet Take 1 tablet (50 mg total) by mouth every 6 (six) hours as needed for moderate pain (  pain score 4-6) or severe pain (pain score 7-10). 60 tablet 0   No current facility-administered medications for this visit.    REVIEW OF SYSTEMS:   Constitutional: ( - ) fevers, ( - )   chills , ( - ) night sweats Eyes: ( - ) blurriness of vision, ( - ) double vision, ( - ) watery eyes Ears, nose, mouth, throat, and face: ( - ) mucositis, ( - ) sore throat Respiratory: ( - ) cough, ( - ) dyspnea, ( - ) wheezes Cardiovascular: ( - ) palpitation, ( - ) chest discomfort, ( - ) lower extremity swelling Gastrointestinal:  ( - ) nausea, ( - ) heartburn, ( - ) change in bowel habits Skin: ( - ) abnormal skin rashes Lymphatics: ( - ) new lymphadenopathy, ( - ) easy bruising Neurological: ( - ) numbness, ( - ) tingling, ( - ) new weaknesses Behavioral/Psych: ( - ) mood change, ( - ) new changes  All other systems were reviewed with the patient and are negative.  PHYSICAL EXAMINATION: Not performed due to virtual visit  LABORATORY DATA:  I have reviewed the data as listed    Latest Ref Rng & Units 05/08/2024    9:35 AM 04/10/2024    8:37 AM 03/22/2024    2:58 AM  CBC  WBC 4.0 - 10.5 [name]/uL 8.8  8.0  7.3   Hemoglobin 13.0 - 17.0 g/dL 89.3  88.1  87.7   Hematocrit 39.0 - 52.0 % 33.1  36.4  37.5   Platelets 150 - 400 [name]/uL 175  209  146        Latest Ref Rng & Units 05/08/2024    9:35 AM 04/10/2024    8:37 AM 03/22/2024    2:58 AM  CMP  Glucose 70 - 99 mg/dL 865  888  893   BUN 8 - 23 mg/dL 13  19  14    Creatinine 0.61 - 1.24 mg/dL 9.35  9.34  9.25   Sodium 135 - 145 mmol/L 135  141  137   Potassium 3.5 - 5.1 mmol/L 4.1  4.0  3.3   Chloride 98 - 111 mmol/L 102  105  97   CO2 22 - 32 mmol/L 25  26  25    Calcium  8.9 - 10.3 mg/dL 9.4  9.6  8.4   Total Protein 6.5 - 8.1 g/dL 6.8  6.7    Total Bilirubin 0.0 - 1.2 mg/dL 0.2  0.3    Alkaline Phos 38 - 126 U/L 274  228    AST 15 - 41 U/L 45  45    ALT 0 - 44 U/L 30  33      Lab Results  Component Value Date   MPROTEIN Not Observed 05/08/2024   MPROTEIN Not Observed 04/10/2024   MPROTEIN Not Observed 03/22/2024   Lab Results  Component Value Date   KPAFRELGTCHN <0.7 (L) 05/08/2024   KPAFRELGTCHN <0.7 (L) 04/10/2024    KPAFRELGTCHN <0.7 (L) 03/22/2024   LAMBDASER <1.5 (L) 05/08/2024   LAMBDASER <1.5 (L) 04/10/2024   LAMBDASER <1.5 (L) 03/22/2024   KAPLAMBRATIO UNABLE TO CALCULATE 05/08/2024   KAPLAMBRATIO UNABLE TO CALCULATE 04/10/2024   KAPLAMBRATIO UNABLE TO CALCULATE 03/22/2024     RADIOGRAPHIC STUDIES: No results found.    ASSESSMENT & PLAN Travis [name] is a 74 y.o. male with medical history significant for IgA Kappa Multiple Myeloma who presents for a follow up visit.   # IgA Kappa Multiple Myeloma --Metastatic  survey completed 12/15/2021 --Patient underwent fixation of his humerus on 12/17/2021, pathology confirmed plasmacytoma/plasma cell neoplasm.  --Received 6 cycles of Darzalex , Revlimid , and dexamethasone  started on 12/28/2021-05/18/2022.  --Due to worsening myeloma osseous lesions including pathologic fracture at T2 with cord compression, Dr. Federico recommends changing therapy to Velcade /Pomalyst /Dex.  --Due to rising kappa free light chains, recommend to change therapy to Carfilzomib /Dex started on 01/12/2023. Plan: --s/p CAR[facility]T at [facility] on 06/27/2023 --labs show white blood cell 8.8, hemoglobin 10.6, MCV 93.0, platelets 175.. Myeloma panel shows M protein is not detectable and sFLC are not elevated.  --Continue with monthly IVIG therapy for his Guillain-Barr per [facility] recommendations --RTC in 3 months with labs and follow up  # Malignant Melanoma of the Skin -- Biopsy of a lesion on the patient's cheek consistent with malignant melanoma.  Pathology report notes it is concerning for metastatic melanoma -- Will order a whole-body PET CT scan and sentinel lymph node biopsy with ENT -- Will return to clinic pending the results of the studies.  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  --MRI C/T/L spine obtinaed that showed unchanged lytic lesions in the right C7 inferior articular process,T6, T8, and T10  vertebral bodies, as well  as the T11 spinous process. Unchanged  multilevel cervical spondylsis, Interval enlargement of disc herniation at L3-L4 with progressive spinal canal stenosis. No new or progressive lytic lesions.  --Encouraged to continue with physical therapy and follow up with ortho.   #Palliative Radiation Therapy: # Dysphagia --Received palliative radiation from 01/06/2022-01/19/2022 to right hymerus, right forearm and right chest/rib, 20 Gy in 10 Fx.  --recieved palliative radiation to surgical bed in the upper thoracic spine area on 07/13/2022.  27 Gy in 9 Fx.   #Lower extremity edema--improved: --Currently on lasix  and torsemide   # Rash Concerning for Shingles-resolved -- Prescribed valacyclovir  3 times daily 1000 mg x 10 days-completed the course -- We will continue to monitor rash closely.  #DVT: --Found to have acute DVT involving left peroneal veins. Likely secondary to recent surgery and hospitalization --Currently on eliquis  5 mg BID.    #Hypokalemia: --Potassium level from 03/22/24 was 4.1. Advised to continue with 40 mEq daily and increase to 60 mEq on the days he takes torsemide /spironolactone   #Rash on scalp and right forearm: --Recommend topical hydrocortisone cream for spot treatment -- Additionally for his IVIG rash recommend Eucerin and Polysporin.  #Supportive Care -- chemotherapy education complete -- port placement not required -- zofran  8mg  q8H PRN and compazine  10mg  PO q6H for nausea -- acyclovir  400mg  PO BID for VCZ prophylaxis -- Will plan to restart Zometa  therapy as soon as is feasible  Orders Placed This Encounter  Procedures   NM PET Image Initial (PI) Whole Body    [name] Status:   Future    Expected Date:    05/28/2024    Expiration Date:   05/21/2025    If indicated for the ordered procedure, I authorize the administration of a radiopharmaceutical per Radiology protocol:   Yes    Preferred imaging location?:   [facility]   All questions were answered. The patient knows to call the clinic with any problems, questions or concerns.   I have spent a total of  30 minutes minutes of non-face-to-face time, preparing to see the patient, obtaining and/or reviewing separately obtained history, counseling and educating the patient,documenting clinical information in the electronic health record, independently interpreting results and communicating results to the patient, and care coordination.   Norleen IVAR Kidney, MD Department of Hematology/Oncology [facility] Phone: [phone] Pager: [phone] Email: [email or web address]    05/22/2024 10:13 AM "

## 2024-05-22 ENCOUNTER — Encounter: Payer: Self-pay | Admitting: Hematology and Oncology

## 2024-05-23 ENCOUNTER — Other Ambulatory Visit: Payer: Self-pay | Admitting: Internal Medicine

## 2024-05-28 ENCOUNTER — Telehealth: Payer: Self-pay | Admitting: Internal Medicine

## 2024-05-28 NOTE — Telephone Encounter (Signed)
 Spoke with patient's wife. States her and her husband's prescriptions needed to be refilled. Husband also has a call in triage for medication assistance. Dr Okey sent in prescriptions, per her note. Attempted to reach out to [facility] for confirmation and pharmacy closed for lunch. Attempted to notify patient wife of this and left message.   Will end encounter

## 2024-05-28 NOTE — Telephone Encounter (Signed)
 PT had not heard back from office  They called me re refills  Filled Rx for  Hydralazine  50 tid Spironolactone  25 daily Diltiazem  CD 120 mg  Entresto  49/51 bid

## 2024-05-30 NOTE — Telephone Encounter (Signed)
 Labs are outside Normal Range on 05/08/24

## 2024-05-31 ENCOUNTER — Telehealth: Payer: Self-pay | Admitting: *Deleted

## 2024-05-31 ENCOUNTER — Encounter (HOSPITAL_COMMUNITY)
Admission: RE | Admit: 2024-05-31 | Discharge: 2024-05-31 | Disposition: A | Discharge status: Home / Self Care | Source: Ambulatory Visit | Attending: Hematology and Oncology | Admitting: Hematology and Oncology

## 2024-05-31 DIAGNOSIS — C439 Malignant melanoma of skin, unspecified: Secondary | ICD-10-CM | POA: Diagnosis present

## 2024-05-31 LAB — GLUCOSE, CAPILLARY: Glucose-Capillary: 79 mg/dL (ref 70–99)

## 2024-05-31 MED ORDER — FLUDEOXYGLUCOSE F - 18 (FDG) INJECTION
11.8900 | Freq: Once | INTRAVENOUS | Status: AC
  Administered 2024-05-31: 11.89 via INTRAVENOUS

## 2024-05-31 NOTE — Telephone Encounter (Signed)
 Received call from pt. He states he has an appt at [facility] on 06/11/24 to see melanoma specialist and dermatology for possible Mohs procedure. If he needs lymph node dissection/excisional biopsy they will do that as well. Pt having PET scan done today.

## 2024-06-05 ENCOUNTER — Inpatient Hospital Stay

## 2024-06-05 ENCOUNTER — Encounter: Payer: Self-pay | Admitting: Specialist

## 2024-06-05 ENCOUNTER — Inpatient Hospital Stay: Attending: Physician Assistant | Admitting: Physician Assistant

## 2024-06-05 ENCOUNTER — Encounter (HOSPITAL_COMMUNITY): Payer: Self-pay

## 2024-06-05 VITALS — BP 145/79 | HR 64 | Temp 97.6°F | Resp 17

## 2024-06-05 VITALS — BP 132/68 | HR 66 | Temp 97.9°F | Resp 17 | Ht 70.0 in | Wt 245.0 lb

## 2024-06-05 DIAGNOSIS — G610 Guillain-Barre syndrome: Secondary | ICD-10-CM

## 2024-06-05 DIAGNOSIS — C9000 Multiple myeloma not having achieved remission: Secondary | ICD-10-CM | POA: Diagnosis not present

## 2024-06-05 DIAGNOSIS — C787 Secondary malignant neoplasm of liver and intrahepatic bile duct: Secondary | ICD-10-CM

## 2024-06-05 DIAGNOSIS — C439 Malignant melanoma of skin, unspecified: Secondary | ICD-10-CM

## 2024-06-05 LAB — CMP (CANCER CENTER ONLY)
ALT: 61 U/L — ABNORMAL HIGH (ref 0–44)
AST: 85 U/L — ABNORMAL HIGH (ref 15–41)
Albumin: 2.8 g/dL — ABNORMAL LOW (ref 3.5–5.0)
Alkaline Phosphatase: 576 U/L — ABNORMAL HIGH (ref 38–126)
Anion gap: 11 (ref 5–15)
BUN: 15 mg/dL (ref 8–23)
CO2: 23 mmol/L (ref 22–32)
Calcium: 9 mg/dL (ref 8.9–10.3)
Chloride: 101 mmol/L (ref 98–111)
Creatinine: 0.59 mg/dL — ABNORMAL LOW (ref 0.61–1.24)
GFR, Estimated: >60 mL/min
Glucose, Bld: 140 mg/dL — ABNORMAL HIGH (ref 70–99)
Potassium: 4 mmol/L (ref 3.5–5.1)
Sodium: 135 mmol/L (ref 135–145)
Total Bilirubin: 0.3 mg/dL (ref 0.0–1.2)
Total Protein: 6.1 g/dL — ABNORMAL LOW (ref 6.5–8.1)

## 2024-06-05 LAB — CBC WITH DIFFERENTIAL (CANCER CENTER ONLY)
Abs Immature Granulocytes: 0.04 10*3/uL (ref 0.00–0.07)
Basophils Absolute: 0 10*3/uL (ref 0.0–0.1)
Basophils Relative: 0 %
Eosinophils Absolute: 0.1 10*3/uL (ref 0.0–0.5)
Eosinophils Relative: 1 %
HCT: 31 % — ABNORMAL LOW (ref 39.0–52.0)
Hemoglobin: 10 g/dL — ABNORMAL LOW (ref 13.0–17.0)
Immature Granulocytes: 0 %
Lymphocytes Relative: 2 %
Lymphs Abs: 0.2 10*3/uL — ABNORMAL LOW (ref 0.7–4.0)
MCH: 29.3 pg (ref 26.0–34.0)
MCHC: 32.3 g/dL (ref 30.0–36.0)
MCV: 90.9 fL (ref 80.0–100.0)
Monocytes Absolute: 1.1 10*3/uL — ABNORMAL HIGH (ref 0.1–1.0)
Monocytes Relative: 10 %
Neutro Abs: 9.5 10*3/uL — ABNORMAL HIGH (ref 1.7–7.7)
Neutrophils Relative %: 87 %
Platelet Count: 89 10*3/uL — ABNORMAL LOW (ref 150–400)
RBC: 3.41 MIL/uL — ABNORMAL LOW (ref 4.22–5.81)
RDW: 17.5 % — ABNORMAL HIGH (ref 11.5–15.5)
WBC Count: 11 10*3/uL — ABNORMAL HIGH (ref 4.0–10.5)
nRBC: 0 % (ref 0.0–0.2)

## 2024-06-05 LAB — LACTATE DEHYDROGENASE: LDH: 767 U/L — ABNORMAL HIGH (ref 105–235)

## 2024-06-05 MED ORDER — FAMOTIDINE IN NACL 20-0.9 MG/50ML-% IV SOLN
20.0000 mg | Freq: Once | INTRAVENOUS | Status: AC
  Administered 2024-06-05: 20 mg via INTRAVENOUS
  Filled 2024-06-05: qty 50

## 2024-06-05 MED ORDER — DEXTROSE 5 % IV SOLN: INTRAVENOUS | Status: DC

## 2024-06-05 MED ORDER — GAMMAGARD 10 GM/100ML IJ SOLN
1.0000 g/kg | Freq: Once | INTRAMUSCULAR | Status: AC
  Administered 2024-06-05: 110 g via INTRAVENOUS
  Filled 2024-06-05: qty 1100

## 2024-06-05 MED ORDER — OXYCODONE HCL 5 MG PO TABS: 5.0000 mg | ORAL_TABLET | Freq: Four times a day (QID) | ORAL | 0 refills | Status: AC | PRN

## 2024-06-05 MED ORDER — ACETAMINOPHEN 325 MG PO TABS
650.0000 mg | ORAL_TABLET | Freq: Once | ORAL | Status: AC
  Administered 2024-06-05: 650 mg via ORAL
  Filled 2024-06-05: qty 2

## 2024-06-05 MED ORDER — DIPHENHYDRAMINE HCL 25 MG PO CAPS
25.0000 mg | ORAL_CAPSULE | Freq: Once | ORAL | Status: AC
  Administered 2024-06-05: 25 mg via ORAL
  Filled 2024-06-05: qty 1

## 2024-06-05 NOTE — Patient Instructions (Signed)
 Immune Globulin  Injection What is this medication? IMMUNE GLOBULIN  (im MUNE GLOB yoo lin) treats many immune system conditions. It works by [employer], multimedia extra antibodies. Antibodies are proteins made by the immune system that help protect the body. This medicine may be used for other purposes; ask your health care provider or pharmacist if you have questions. COMMON BRAND NAME(S): ASCENIV, Baygam, BIVIGAM, Carimune, Carimune NF, cutaquig, Cuvitru, Flebogamma, Flebogamma DIF, GamaSTAN, GamaSTAN S/D, Gamimune N, Gammagard, Gammagard S/D, Gammaked, Gammaplex, Gammar-P IV, Gamunex, Gamunex-C, Hizentra, Iveegam, Iveegam EN, Octagam, Panglobulin, Panglobulin NF, panzyga, Polygam S/D, Privigen , Sandoglobulin, Venoglobulin-S, Vigam, Vivaglobulin, Xembify What should I tell my care team before I take this medication? They need to know if you have any of these conditions: Blood clotting disorder Condition where you have excess fluid in your body, such as heart failure or edema Dehydration Diabetes Have had blood clots Heart disease Immune system conditions Kidney disease Low levels of IgA Recent or upcoming vaccine An unusual or allergic reaction to immune globulin , other medications, foods, dyes, or preservatives Pregnant or trying to get pregnant Breastfeeding How should I use this medication? This medication is infused into a vein or under the skin. It may also be injected into a muscle. It is usually given by your care team in a hospital or clinic setting. It may also be given at home. If you get this medication at home, you will be taught how to prepare and give it. Take it as directed on the prescription label. Keep taking it unless your care team tells you to stop. It is important that you put your used needles and syringes in a special sharps container. Do not put them in a trash can. If you do not have a sharps container, call your pharmacist or care team to get one. Talk to your care team  about the use of this medication in children. While it may be given to children for selected conditions, precautions do apply. Overdosage: If you think you have taken too much of this medicine contact a poison control center or emergency room at once. NOTE: This medicine is only for you. Do not share this medicine with others. What if I miss a dose? If you get this medication at the hospital or clinic: It is important not to miss your dose. Call your care team if you are unable to keep an appointment. If you give yourself this medication at home: If you miss a dose, take it as soon as you can. Then continue your normal schedule. If it is almost time for your next dose, take only that dose. Do not take double or extra doses. Call your care team with questions. What may interact with this medication? Live virus vaccines This list may not describe all possible interactions. Give your health care provider a list of all the medicines, herbs, non-prescription drugs, or dietary supplements you use. Also tell them if you smoke, drink alcohol , or use illegal drugs. Some items may interact with your medicine. What should I watch for while using this medication? Your condition will be monitored carefully while you are receiving this medication. Tell your care team if your symptoms do not start to get better or if they get worse. You may need blood work done while you are taking this medication. This medication increases the risk of blood clots. People with heart, blood vessel, or blood clotting conditions are more likely to develop a blood clot. Other risk factors include advanced age, estrogen use, tobacco  use, lack of movement, and being overweight. This medication can decrease the response to a vaccine. If you need to get vaccinated, tell your care team if you have received this medication within the last year. Extra booster doses may be needed. Talk to your care team to see if a different vaccination schedule  is needed. This medication is made from donated human blood. There is a small risk it may contain bacteria or viruses, such as hepatitis or HIV. All products are processed to kill most bacteria and viruses. Talk to your care team if you have questions about the risk of infection. If you have diabetes, talk to your care team about which device you should use to check your blood sugar. This medication may cause some devices to report falsely high blood sugar levels. This may cause you to react by not treating a low blood sugar level or by giving an insulin  dose that was not needed. This can cause severe low blood sugar levels. What side effects may I notice from receiving this medication? Side effects that you should report to your care team as soon as possible: Allergic reactions--skin rash, itching, hives, swelling of the face, lips, tongue, or throat Blood clot--pain, swelling, or warmth in the leg, shortness of breath, chest pain Fever, neck pain or stiffness, sensitivity to light, headache, nausea, vomiting, confusion, which may be signs of meningitis Hemolytic anemia--unusual weakness or fatigue, dizziness, headache, trouble breathing, dark urine, yellowing skin or eyes Kidney injury--decrease in the amount of urine, swelling of the ankles, hands, or feet Low sodium level--muscle weakness, fatigue, dizziness, headache, confusion Shortness of breath or trouble breathing, cough, unusual weakness or fatigue, blue skin or lips Side effects that usually do not require medical attention (report these to your care team if they continue or are bothersome): Chills Diarrhea Fever Headache Nausea This list may not describe all possible side effects. Call your doctor for medical advice about side effects. You may report side effects to FDA at 1-800-FDA-1088. Where should I keep my medication? Keep out of the reach of children and pets. You will be instructed on how to store this medication. Get rid of  any unused medication after the expiration date. To get rid of medications that are no longer needed or have expired: Take the medication to a medication take-back program. Check with your pharmacy or law enforcement to find a location. If you cannot return the medication, ask your pharmacist or care team how to get rid of this medication safely. NOTE: This sheet is a summary. It may not cover all possible information. If you have questions about this medicine, talk to your doctor, pharmacist, or health care provider.  2025 Elsevier/Gold Standard (2023-07-04 00:00:00)

## 2024-06-06 ENCOUNTER — Inpatient Hospital Stay

## 2024-06-06 VITALS — BP 132/67 | HR 69 | Temp 98.1°F | Resp 16

## 2024-06-06 DIAGNOSIS — G610 Guillain-Barre syndrome: Secondary | ICD-10-CM

## 2024-06-06 DIAGNOSIS — C9000 Multiple myeloma not having achieved remission: Secondary | ICD-10-CM

## 2024-06-06 LAB — KAPPA/LAMBDA LIGHT CHAINS
Kappa free light chain: 0.8 mg/L — ABNORMAL LOW (ref 3.3–19.4)
Kappa, lambda light chain ratio: >0.53 (ref 0.26–1.65)
Lambda free light chains: <1.5 mg/L — ABNORMAL LOW (ref 5.7–26.3)

## 2024-06-06 MED ORDER — DEXTROSE 5 % IV SOLN: Freq: Once | INTRAVENOUS | Status: AC

## 2024-06-06 MED ORDER — ACETAMINOPHEN 325 MG PO TABS
650.0000 mg | ORAL_TABLET | Freq: Once | ORAL | Status: AC
  Administered 2024-06-06: 650 mg via ORAL
  Filled 2024-06-06: qty 2

## 2024-06-06 MED ORDER — DIPHENHYDRAMINE HCL 25 MG PO CAPS
25.0000 mg | ORAL_CAPSULE | Freq: Once | ORAL | Status: AC
  Administered 2024-06-06: 25 mg via ORAL
  Filled 2024-06-06: qty 1

## 2024-06-06 MED ORDER — GAMMAGARD 10 GM/100ML IJ SOLN
1.0000 g/kg | Freq: Once | INTRAMUSCULAR | Status: AC
  Administered 2024-06-06: 110 g via INTRAVENOUS
  Filled 2024-06-06: qty 200

## 2024-06-06 MED ORDER — FAMOTIDINE IN NACL 20-0.9 MG/50ML-% IV SOLN
20.0000 mg | Freq: Once | INTRAVENOUS | Status: AC
  Administered 2024-06-06: 20 mg via INTRAVENOUS
  Filled 2024-06-06: qty 50

## 2024-06-06 NOTE — Patient Instructions (Signed)
 Immune Globulin  Injection What is this medication? IMMUNE GLOBULIN  (im MUNE GLOB yoo lin) treats many immune system conditions. It works by [employer], multimedia extra antibodies. Antibodies are proteins made by the immune system that help protect the body. This medicine may be used for other purposes; ask your health care provider or pharmacist if you have questions. COMMON BRAND NAME(S): ASCENIV, Baygam, BIVIGAM, Carimune, Carimune NF, cutaquig, Cuvitru, Flebogamma, Flebogamma DIF, GamaSTAN, GamaSTAN S/D, Gamimune N, Gammagard, Gammagard S/D, Gammaked, Gammaplex, Gammar-P IV, Gamunex, Gamunex-C, Hizentra, Iveegam, Iveegam EN, Octagam, Panglobulin, Panglobulin NF, panzyga, Polygam S/D, Privigen , Sandoglobulin, Venoglobulin-S, Vigam, Vivaglobulin, Xembify What should I tell my care team before I take this medication? They need to know if you have any of these conditions: Blood clotting disorder Condition where you have excess fluid in your body, such as heart failure or edema Dehydration Diabetes Have had blood clots Heart disease Immune system conditions Kidney disease Low levels of IgA Recent or upcoming vaccine An unusual or allergic reaction to immune globulin , other medications, foods, dyes, or preservatives Pregnant or trying to get pregnant Breastfeeding How should I use this medication? This medication is infused into a vein or under the skin. It may also be injected into a muscle. It is usually given by your care team in a hospital or clinic setting. It may also be given at home. If you get this medication at home, you will be taught how to prepare and give it. Take it as directed on the prescription label. Keep taking it unless your care team tells you to stop. It is important that you put your used needles and syringes in a special sharps container. Do not put them in a trash can. If you do not have a sharps container, call your pharmacist or care team to get one. Talk to your care team  about the use of this medication in children. While it may be given to children for selected conditions, precautions do apply. Overdosage: If you think you have taken too much of this medicine contact a poison control center or emergency room at once. NOTE: This medicine is only for you. Do not share this medicine with others. What if I miss a dose? If you get this medication at the hospital or clinic: It is important not to miss your dose. Call your care team if you are unable to keep an appointment. If you give yourself this medication at home: If you miss a dose, take it as soon as you can. Then continue your normal schedule. If it is almost time for your next dose, take only that dose. Do not take double or extra doses. Call your care team with questions. What may interact with this medication? Live virus vaccines This list may not describe all possible interactions. Give your health care provider a list of all the medicines, herbs, non-prescription drugs, or dietary supplements you use. Also tell them if you smoke, drink alcohol , or use illegal drugs. Some items may interact with your medicine. What should I watch for while using this medication? Your condition will be monitored carefully while you are receiving this medication. Tell your care team if your symptoms do not start to get better or if they get worse. You may need blood work done while you are taking this medication. This medication increases the risk of blood clots. People with heart, blood vessel, or blood clotting conditions are more likely to develop a blood clot. Other risk factors include advanced age, estrogen use, tobacco  use, lack of movement, and being overweight. This medication can decrease the response to a vaccine. If you need to get vaccinated, tell your care team if you have received this medication within the last year. Extra booster doses may be needed. Talk to your care team to see if a different vaccination schedule  is needed. This medication is made from donated human blood. There is a small risk it may contain bacteria or viruses, such as hepatitis or HIV. All products are processed to kill most bacteria and viruses. Talk to your care team if you have questions about the risk of infection. If you have diabetes, talk to your care team about which device you should use to check your blood sugar. This medication may cause some devices to report falsely high blood sugar levels. This may cause you to react by not treating a low blood sugar level or by giving an insulin  dose that was not needed. This can cause severe low blood sugar levels. What side effects may I notice from receiving this medication? Side effects that you should report to your care team as soon as possible: Allergic reactions--skin rash, itching, hives, swelling of the face, lips, tongue, or throat Blood clot--pain, swelling, or warmth in the leg, shortness of breath, chest pain Fever, neck pain or stiffness, sensitivity to light, headache, nausea, vomiting, confusion, which may be signs of meningitis Hemolytic anemia--unusual weakness or fatigue, dizziness, headache, trouble breathing, dark urine, yellowing skin or eyes Kidney injury--decrease in the amount of urine, swelling of the ankles, hands, or feet Low sodium level--muscle weakness, fatigue, dizziness, headache, confusion Shortness of breath or trouble breathing, cough, unusual weakness or fatigue, blue skin or lips Side effects that usually do not require medical attention (report these to your care team if they continue or are bothersome): Chills Diarrhea Fever Headache Nausea This list may not describe all possible side effects. Call your doctor for medical advice about side effects. You may report side effects to FDA at 1-800-FDA-1088. Where should I keep my medication? Keep out of the reach of children and pets. You will be instructed on how to store this medication. Get rid of  any unused medication after the expiration date. To get rid of medications that are no longer needed or have expired: Take the medication to a medication take-back program. Check with your pharmacy or law enforcement to find a location. If you cannot return the medication, ask your pharmacist or care team how to get rid of this medication safely. NOTE: This sheet is a summary. It may not cover all possible information. If you have questions about this medicine, talk to your doctor, pharmacist, or health care provider.  2025 Elsevier/Gold Standard (2023-07-04 00:00:00)

## 2024-06-07 ENCOUNTER — Other Ambulatory Visit: Payer: Self-pay | Admitting: Radiology

## 2024-06-07 DIAGNOSIS — K769 Liver disease, unspecified: Secondary | ICD-10-CM

## 2024-06-07 LAB — MULTIPLE MYELOMA PANEL, SERUM
Albumin SerPl Elph-Mcnc: 2 g/dL — ABNORMAL LOW (ref 2.9–4.4)
Albumin/Glob SerPl: 0.7 (ref 0.7–1.7)
Alpha 1: 0.5 g/dL — ABNORMAL HIGH (ref 0.0–0.4)
Alpha2 Glob SerPl Elph-Mcnc: 1.1 g/dL — ABNORMAL HIGH (ref 0.4–1.0)
B-Globulin SerPl Elph-Mcnc: 0.8 g/dL (ref 0.7–1.3)
Gamma Glob SerPl Elph-Mcnc: 0.7 g/dL (ref 0.4–1.8)
Globulin, Total: 3.1 g/dL (ref 2.2–3.9)
IgA: <5 mg/dL — ABNORMAL LOW (ref 61–437)
IgG (Immunoglobin G), Serum: 875 mg/dL (ref 603–1613)
IgM (Immunoglobulin M), Srm: <5 mg/dL — ABNORMAL LOW (ref 15–143)
Total Protein ELP: 5.1 g/dL — ABNORMAL LOW (ref 6.0–8.5)

## 2024-06-08 ENCOUNTER — Telehealth: Payer: Self-pay

## 2024-06-08 NOTE — Telephone Encounter (Signed)
 Patient for US  guided Liver Biopsy on Mon 06/11/24, I called and spoke with the patient on the phone and gave pre-procedure instructions. Pt was made aware to be here at 10a, NPO after MN prior to procedure as well as driver post procedure/recovery/discharge. Pt stated understanding. Called 06/08/24

## 2024-06-11 ENCOUNTER — Ambulatory Visit

## 2024-06-14 ENCOUNTER — Ambulatory Visit: Admitting: Physician Assistant

## 2024-06-25 ENCOUNTER — Ambulatory Visit: Admitting: Podiatry

## 2024-08-02 ENCOUNTER — Ambulatory Visit: Admitting: Emergency Medicine

## 2024-08-07 ENCOUNTER — Ambulatory Visit: Admitting: Neurology

## 2024-08-13 ENCOUNTER — Ambulatory Visit: Admitting: Neurology
